# Patient Record
Sex: Male | Born: 1960 | Race: Black or African American | Hispanic: No | Marital: Married | Smoking: Never smoker
Health system: Southern US, Community
[De-identification: ages and names within clinical notes are randomized; demographics above are authoritative.]

## PROBLEM LIST (undated history)

## (undated) DIAGNOSIS — N186 End stage renal disease: Secondary | ICD-10-CM

## (undated) DIAGNOSIS — I10 Essential (primary) hypertension: Secondary | ICD-10-CM

## (undated) DIAGNOSIS — N289 Disorder of kidney and ureter, unspecified: Secondary | ICD-10-CM

## (undated) DIAGNOSIS — E119 Type 2 diabetes mellitus without complications: Secondary | ICD-10-CM

## (undated) DIAGNOSIS — I739 Peripheral vascular disease, unspecified: Secondary | ICD-10-CM

## (undated) DIAGNOSIS — N189 Chronic kidney disease, unspecified: Secondary | ICD-10-CM

## (undated) HISTORY — PX: OTHER SURGICAL HISTORY: SHX169

## (undated) HISTORY — DX: Chronic kidney disease, unspecified: N18.9

## (undated) HISTORY — DX: Type 2 diabetes mellitus without complications: E11.9

---

## 2012-05-19 ENCOUNTER — Emergency Department (HOSPITAL_COMMUNITY): Payer: Medicaid - Out of State

## 2012-05-19 ENCOUNTER — Encounter (HOSPITAL_COMMUNITY): Payer: Self-pay

## 2012-05-19 ENCOUNTER — Emergency Department (HOSPITAL_COMMUNITY)
Admission: EM | Admit: 2012-05-19 | Discharge: 2012-05-19 | Disposition: A | Discharge status: Home / Self Care | Payer: Medicaid - Out of State | Attending: Emergency Medicine | Admitting: Emergency Medicine

## 2012-05-19 DIAGNOSIS — R6883 Chills (without fever): Secondary | ICD-10-CM | POA: Insufficient documentation

## 2012-05-19 DIAGNOSIS — I120 Hypertensive chronic kidney disease with stage 5 chronic kidney disease or end stage renal disease: Secondary | ICD-10-CM | POA: Insufficient documentation

## 2012-05-19 DIAGNOSIS — R1110 Vomiting, unspecified: Secondary | ICD-10-CM

## 2012-05-19 DIAGNOSIS — E119 Type 2 diabetes mellitus without complications: Secondary | ICD-10-CM | POA: Insufficient documentation

## 2012-05-19 DIAGNOSIS — Z992 Dependence on renal dialysis: Secondary | ICD-10-CM | POA: Insufficient documentation

## 2012-05-19 DIAGNOSIS — M549 Dorsalgia, unspecified: Secondary | ICD-10-CM

## 2012-05-19 DIAGNOSIS — Z79899 Other long term (current) drug therapy: Secondary | ICD-10-CM | POA: Insufficient documentation

## 2012-05-19 DIAGNOSIS — N186 End stage renal disease: Secondary | ICD-10-CM | POA: Insufficient documentation

## 2012-05-19 DIAGNOSIS — R112 Nausea with vomiting, unspecified: Secondary | ICD-10-CM | POA: Insufficient documentation

## 2012-05-19 HISTORY — DX: Essential (primary) hypertension: I10

## 2012-05-19 HISTORY — DX: Type 2 diabetes mellitus without complications: E11.9

## 2012-05-19 HISTORY — DX: Disorder of kidney and ureter, unspecified: N28.9

## 2012-05-19 LAB — CBC WITH DIFFERENTIAL/PLATELET
Basophils Absolute: 0 10*3/uL (ref 0.0–0.1)
Basophils Relative: 0 % (ref 0–1)
Eosinophils Absolute: 0.1 10*3/uL (ref 0.0–0.7)
HCT: 36.3 % — ABNORMAL LOW (ref 39.0–52.0)
Hemoglobin: 11.5 g/dL — ABNORMAL LOW (ref 13.0–17.0)
MCH: 27.1 pg (ref 26.0–34.0)
MCHC: 31.7 g/dL (ref 30.0–36.0)
Monocytes Absolute: 0.2 10*3/uL (ref 0.1–1.0)
Monocytes Relative: 3 % (ref 3–12)
Neutro Abs: 4.5 10*3/uL (ref 1.7–7.7)
Neutrophils Relative %: 75 % (ref 43–77)
RDW: 15.6 % — ABNORMAL HIGH (ref 11.5–15.5)

## 2012-05-19 LAB — COMPREHENSIVE METABOLIC PANEL
AST: 18 U/L (ref 0–37)
Albumin: 4.1 g/dL (ref 3.5–5.2)
BUN: 72 mg/dL — ABNORMAL HIGH (ref 6–23)
Chloride: 100 mEq/L (ref 96–112)
Creatinine, Ser: 10.35 mg/dL — ABNORMAL HIGH (ref 0.50–1.35)
Total Protein: 8.8 g/dL — ABNORMAL HIGH (ref 6.0–8.3)

## 2012-05-19 MED ORDER — ONDANSETRON 4 MG PO TBDP: 4.0000 mg | ORAL_TABLET | Freq: Three times a day (TID) | ORAL | Status: DC | PRN

## 2012-05-19 MED ORDER — HYDROCODONE-ACETAMINOPHEN 5-325 MG PO TABS: 1.0000 | ORAL_TABLET | Freq: Three times a day (TID) | ORAL | Status: AC | PRN

## 2012-05-19 MED ORDER — SODIUM CHLORIDE 0.9 % IV SOLN
INTRAVENOUS | Status: DC
  Administered 2012-05-19: 09:00:00 via INTRAVENOUS

## 2012-05-19 MED ORDER — ONDANSETRON HCL 4 MG/2ML IJ SOLN
4.0000 mg | Freq: Once | INTRAMUSCULAR | Status: AC
  Administered 2012-05-19: 4 mg via INTRAVENOUS
  Filled 2012-05-19: qty 2

## 2012-05-19 MED ORDER — HYDROMORPHONE HCL PF 1 MG/ML IJ SOLN
1.0000 mg | Freq: Once | INTRAMUSCULAR | Status: AC
  Administered 2012-05-19: 1 mg via INTRAVENOUS
  Filled 2012-05-19: qty 1

## 2012-05-19 MED ORDER — SODIUM CHLORIDE 0.9 % IV SOLN: INTRAVENOUS | Status: DC

## 2012-05-19 NOTE — ED Provider Notes (Signed)
History  This chart was scribed for Raymond Kung, MD by Jenne Campus, ED Scribe. This patient was seen in room APA18/[facility]18 and the patient's care was started at 8:05 PM.  CSN: [id number]  Arrival date & time 05/19/12  0736   First MD Initiated Contact with Patient 05/19/12 0805      Chief Complaint  Patient presents with  . Flank Pain     Patient is a 51 y.o. male presenting with flank pain. The history is provided by the patient. No language interpreter was used.  Flank Pain This is a new problem. The current episode started 3 to 5 hours ago. The problem occurs constantly. The problem has been gradually worsening. Associated symptoms include abdominal pain (mild). Pertinent negatives include no chest pain, no headaches and no shortness of breath. Nothing aggravates the symptoms. Nothing relieves the symptoms. He has tried nothing for the symptoms.    Raymond [name] is a 51 y.o. male who presents to the Emergency Department complaining of 4 hours of gradual onset, gradually worsening, constant of left flank pain described as a sharp ache with associated mild epigastric abdominal pain, chills, nausea and 10 episodes of non-bloody emesis. He rates his pain an 8 out of 10 currently. He denies having prior episodes of similar symptoms. He has a h/o kidney failure and is a dialysis pt (Tues, Thurs, Sat) started 2 years ago. He reports that he had his normal dialysis appointment 2 days ago and his next appointment is in 2 days. He states that he has chronic decreased frequency due to dialysis but denies changes. He denies having any known sick contacts with similar symptoms. He denies diarrhea, HA, and sore throat as associated symptoms. He has a h/o HTN (BP is 209/121 in the ED) and DM and denies alcohol use.  Endocrinologist is Dr. Alesia Banda   Past Medical History  Diagnosis Date  . Hypertension   . Renal disorder   . Diabetes mellitus without complication     History reviewed. No  pertinent past surgical history.  No family history on file.  History  Substance Use Topics  . Smoking status: Not on file  . Smokeless tobacco: Not on file  . Alcohol Use: No      Review of Systems  Constitutional: Positive for chills. Negative for fever.  Respiratory: Negative for cough and shortness of breath.   Cardiovascular: Negative for chest pain.  Gastrointestinal: Positive for nausea, vomiting and abdominal pain (mild). Negative for diarrhea.  Genitourinary: Positive for flank pain.  Neurological: Negative for headaches.  All other systems reviewed and are negative.    Allergies  Review of patient's allergies indicates no known allergies.  Home Medications     Triage Vitals: BP 209/121  Pulse 78  Temp 97.9 F (36.6 C) (Oral)  Resp 16  Ht 5\' 7"  (1.702 m)  Wt 216 lb 0.8 oz (98 kg)  BMI 33.84 kg/m2  SpO2 98%  Physical Exam  Nursing note and vitals reviewed. Constitutional: He is oriented to person, place, and time. He appears well-developed and well-nourished. No distress.  HENT:  Head: Normocephalic and atraumatic.  Mouth/Throat: Oropharynx is clear and moist.  Eyes: Conjunctivae normal and EOM are normal. Pupils are equal, round, and reactive to light.  Neck: Normal range of motion. Neck supple. No tracheal deviation present.  Cardiovascular: Normal rate and regular rhythm.   No murmur heard. Pulmonary/Chest: Effort normal and breath sounds normal. No respiratory distress.  Abdominal: Soft. Bowel sounds are normal.  There is no tenderness.       No CVA tenderness to palpation  Musculoskeletal: Normal range of motion.       No back tenderness to palpation  Neurological: He is alert and oriented to person, place, and time. No cranial nerve deficit.       Pt able to move both sets of fingers and toes  Skin: Skin is warm and dry.  Psychiatric: He has a normal mood and affect. His behavior is normal.    ED Course  Procedures (including critical care  time)  DIAGNOSTIC STUDIES: Oxygen Saturation is 98% on room air, normal by my interpretation.    COORDINATION OF CARE: 8:24 AM- Discussed treatment plan which includes CT abdomen, CXR and pain and antiemetic medications with pt at bedside and pt agreed to plan.  8:45 AM- Ordered 4 mg Zofran injection and 1 mg Dilaudid injection.  12:23 PM-Pt rechecked and feels improved. Upon re-exam, back is still non-tender to palpation. He is requesting food. Informed pt of radiology and lab results. Discussed discharge plan with pt and pt agreed to plan. Also advised pt to follow up with PCP and pt agreed.  Labs Reviewed  CBC WITH DIFFERENTIAL - Abnormal; Notable for the following:    Hemoglobin 11.5 (*)     HCT 36.3 (*)     RDW 15.6 (*)     All other components within normal limits  COMPREHENSIVE METABOLIC PANEL - Abnormal; Notable for the following:    Glucose, Bld 163 (*)     BUN 72 (*)     Creatinine, Ser 10.35 (*)     Total Protein 8.8 (*)     GFR calc non Af Amer 5 (*)     GFR calc Af Amer 6 (*)     All other components within normal limits   Ct Abdomen Pelvis Wo Contrast  05/19/2012  *RADIOLOGY REPORT*  Clinical Data: Left flank pain, vomiting  CT ABDOMEN AND PELVIS WITHOUT CONTRAST  Technique:  Multidetector CT imaging of the abdomen and pelvis was performed following the standard protocol without intravenous contrast.  Comparison: None.  Findings: The lung bases are clear.  There is mild cardiomegaly present.  The liver is unremarkable in the unenhanced state.  No calcified gallstones are seen although there is higher attenuation debris layering in the fundus of the gallbladder and small partially calcified gallstones cannot be excluded.  The pancreas has been fatty replaced.  The adrenal glands and spleen are unremarkable.  The stomach is fluid distended with no abnormality noted.  No renal calculi are seen  However, there is fullness of the left pelvocaliceal system and left ureter to the  bladder.  No definite calculus or point of obstruction is seen.  This fullness may be due to edema from recent passage of a calculus.  No abnormality of the urinary bladder is seen although the urinary bladder is not well distended.  A reservoir is noted in the right anterior pelvis for penile prosthesis.  The prostate is normal in size.  No abnormality of the colon is noted.  The terminal ileum and appendix are unremarkable. A small umbilical hernia is present containing only fat.  No bony abnormality is seen to  IMPRESSION:  1.  No renal calculi are seen.  However, there is slight fullness of the left pelvis and left ureter to the bladder with no point of obstruction.  This may be due to recently passed calculus and resultant edema. 2.  Cannot exclude  small gallstones layering in the fundus of the gallbladder. 3.  The appendix and terminal ileum are unremarkable. 4.  Small umbilical hernia containing only fat.   Original Report Authenticated By: Ivar Drape, M.D.    Dg Chest 1 View  05/19/2012  *RADIOLOGY REPORT*  Clinical Data: Flank pain  CHEST - 1 VIEW  Comparison: None.  Findings: Borderline cardiomegaly noted.  No acute infiltrate or pleural effusion.  No pulmonary edema.  Mild basilar atelectasis.  IMPRESSION: Borderline cardiomegaly.  No acute infiltrate.  Mild basilar atelectasis.   Original Report Authenticated By: Lahoma Crocker, M.D.      1. Vomiting   2. Back pain       MDM    Patient with acute onset of left back left flank pain this morning associated with nausea and vomiting several episodes. No diarrhea no blood in the vomit. Workup without any specific findings patient is a dialysis patient was dialyzed on Monday not due to be dialyzed again until Friday due to the holiday schedule. Patient without any leukocytosis. Clinically suspect this may be a viral illness CT without any definitive findings. Some question of maybe something around the left pelvis left ureter that could be followed  up. But does not appear to require admission at this point in time.  Patient improved in ED with pain medicine and antinausea medicine.      I personally performed the services described in this documentation, which was scribed in my presence. The recorded information has been reviewed and is accurate.     Raymond Kung, MD 05/19/12 [phone]

## 2012-05-19 NOTE — ED Notes (Signed)
Complain of pain in left flank area and n/v

## 2012-05-19 NOTE — ED Notes (Signed)
Pt cont to have hacking non productive cough

## 2013-08-25 DIAGNOSIS — D631 Anemia in chronic kidney disease: Secondary | ICD-10-CM | POA: Diagnosis not present

## 2013-08-25 DIAGNOSIS — D509 Iron deficiency anemia, unspecified: Secondary | ICD-10-CM | POA: Diagnosis not present

## 2013-08-25 DIAGNOSIS — R1110 Vomiting, unspecified: Secondary | ICD-10-CM | POA: Diagnosis not present

## 2013-08-25 DIAGNOSIS — N039 Chronic nephritic syndrome with unspecified morphologic changes: Secondary | ICD-10-CM | POA: Diagnosis not present

## 2013-08-25 DIAGNOSIS — N186 End stage renal disease: Secondary | ICD-10-CM | POA: Diagnosis not present

## 2013-08-25 DIAGNOSIS — R110 Nausea: Secondary | ICD-10-CM | POA: Diagnosis not present

## 2013-08-25 DIAGNOSIS — N2581 Secondary hyperparathyroidism of renal origin: Secondary | ICD-10-CM | POA: Diagnosis not present

## 2013-08-27 DIAGNOSIS — N2581 Secondary hyperparathyroidism of renal origin: Secondary | ICD-10-CM | POA: Diagnosis not present

## 2013-08-27 DIAGNOSIS — D509 Iron deficiency anemia, unspecified: Secondary | ICD-10-CM | POA: Diagnosis not present

## 2013-08-27 DIAGNOSIS — N039 Chronic nephritic syndrome with unspecified morphologic changes: Secondary | ICD-10-CM | POA: Diagnosis not present

## 2013-08-27 DIAGNOSIS — D631 Anemia in chronic kidney disease: Secondary | ICD-10-CM | POA: Diagnosis not present

## 2013-08-27 DIAGNOSIS — R1110 Vomiting, unspecified: Secondary | ICD-10-CM | POA: Diagnosis not present

## 2013-08-27 DIAGNOSIS — R110 Nausea: Secondary | ICD-10-CM | POA: Diagnosis not present

## 2013-08-27 DIAGNOSIS — N186 End stage renal disease: Secondary | ICD-10-CM | POA: Diagnosis not present

## 2013-08-30 DIAGNOSIS — R1110 Vomiting, unspecified: Secondary | ICD-10-CM | POA: Diagnosis not present

## 2013-08-30 DIAGNOSIS — N186 End stage renal disease: Secondary | ICD-10-CM | POA: Diagnosis not present

## 2013-08-30 DIAGNOSIS — D509 Iron deficiency anemia, unspecified: Secondary | ICD-10-CM | POA: Diagnosis not present

## 2013-08-30 DIAGNOSIS — R110 Nausea: Secondary | ICD-10-CM | POA: Diagnosis not present

## 2013-08-30 DIAGNOSIS — N039 Chronic nephritic syndrome with unspecified morphologic changes: Secondary | ICD-10-CM | POA: Diagnosis not present

## 2013-08-30 DIAGNOSIS — D631 Anemia in chronic kidney disease: Secondary | ICD-10-CM | POA: Diagnosis not present

## 2013-08-30 DIAGNOSIS — N2581 Secondary hyperparathyroidism of renal origin: Secondary | ICD-10-CM | POA: Diagnosis not present

## 2013-09-01 DIAGNOSIS — R110 Nausea: Secondary | ICD-10-CM | POA: Diagnosis not present

## 2013-09-01 DIAGNOSIS — N039 Chronic nephritic syndrome with unspecified morphologic changes: Secondary | ICD-10-CM | POA: Diagnosis not present

## 2013-09-01 DIAGNOSIS — E119 Type 2 diabetes mellitus without complications: Secondary | ICD-10-CM | POA: Diagnosis not present

## 2013-09-01 DIAGNOSIS — N186 End stage renal disease: Secondary | ICD-10-CM | POA: Diagnosis not present

## 2013-09-01 DIAGNOSIS — L97509 Non-pressure chronic ulcer of other part of unspecified foot with unspecified severity: Secondary | ICD-10-CM | POA: Diagnosis not present

## 2013-09-01 DIAGNOSIS — R1110 Vomiting, unspecified: Secondary | ICD-10-CM | POA: Diagnosis not present

## 2013-09-01 DIAGNOSIS — M79609 Pain in unspecified limb: Secondary | ICD-10-CM | POA: Diagnosis not present

## 2013-09-01 DIAGNOSIS — N2581 Secondary hyperparathyroidism of renal origin: Secondary | ICD-10-CM | POA: Diagnosis not present

## 2013-09-01 DIAGNOSIS — D631 Anemia in chronic kidney disease: Secondary | ICD-10-CM | POA: Diagnosis not present

## 2013-09-01 DIAGNOSIS — D509 Iron deficiency anemia, unspecified: Secondary | ICD-10-CM | POA: Diagnosis not present

## 2013-09-03 DIAGNOSIS — N186 End stage renal disease: Secondary | ICD-10-CM | POA: Diagnosis not present

## 2013-09-03 DIAGNOSIS — D509 Iron deficiency anemia, unspecified: Secondary | ICD-10-CM | POA: Diagnosis not present

## 2013-09-03 DIAGNOSIS — R110 Nausea: Secondary | ICD-10-CM | POA: Diagnosis not present

## 2013-09-03 DIAGNOSIS — R1110 Vomiting, unspecified: Secondary | ICD-10-CM | POA: Diagnosis not present

## 2013-09-03 DIAGNOSIS — N2581 Secondary hyperparathyroidism of renal origin: Secondary | ICD-10-CM | POA: Diagnosis not present

## 2013-09-03 DIAGNOSIS — D631 Anemia in chronic kidney disease: Secondary | ICD-10-CM | POA: Diagnosis not present

## 2013-09-05 DIAGNOSIS — N186 End stage renal disease: Secondary | ICD-10-CM | POA: Diagnosis not present

## 2013-09-05 DIAGNOSIS — I10 Essential (primary) hypertension: Secondary | ICD-10-CM | POA: Diagnosis not present

## 2013-09-05 DIAGNOSIS — E669 Obesity, unspecified: Secondary | ICD-10-CM | POA: Diagnosis not present

## 2013-09-05 DIAGNOSIS — E785 Hyperlipidemia, unspecified: Secondary | ICD-10-CM | POA: Diagnosis not present

## 2013-09-06 DIAGNOSIS — D509 Iron deficiency anemia, unspecified: Secondary | ICD-10-CM | POA: Diagnosis not present

## 2013-09-06 DIAGNOSIS — R110 Nausea: Secondary | ICD-10-CM | POA: Diagnosis not present

## 2013-09-06 DIAGNOSIS — N186 End stage renal disease: Secondary | ICD-10-CM | POA: Diagnosis not present

## 2013-09-06 DIAGNOSIS — D631 Anemia in chronic kidney disease: Secondary | ICD-10-CM | POA: Diagnosis not present

## 2013-09-06 DIAGNOSIS — N2581 Secondary hyperparathyroidism of renal origin: Secondary | ICD-10-CM | POA: Diagnosis not present

## 2013-09-06 DIAGNOSIS — R1110 Vomiting, unspecified: Secondary | ICD-10-CM | POA: Diagnosis not present

## 2013-09-07 ENCOUNTER — Ambulatory Visit: Payer: Self-pay | Admitting: Vascular Surgery

## 2013-09-07 DIAGNOSIS — Z01812 Encounter for preprocedural laboratory examination: Secondary | ICD-10-CM | POA: Diagnosis not present

## 2013-09-07 DIAGNOSIS — E1129 Type 2 diabetes mellitus with other diabetic kidney complication: Secondary | ICD-10-CM | POA: Diagnosis not present

## 2013-09-07 DIAGNOSIS — I120 Hypertensive chronic kidney disease with stage 5 chronic kidney disease or end stage renal disease: Secondary | ICD-10-CM | POA: Diagnosis not present

## 2013-09-07 DIAGNOSIS — N186 End stage renal disease: Secondary | ICD-10-CM | POA: Diagnosis not present

## 2013-09-07 DIAGNOSIS — Z01810 Encounter for preprocedural cardiovascular examination: Secondary | ICD-10-CM | POA: Diagnosis not present

## 2013-09-07 LAB — CBC WITH DIFFERENTIAL/PLATELET
Basophil #: 0 10*3/uL (ref 0.0–0.1)
Basophil %: 0.8 %
EOS PCT: 5.3 %
Eosinophil #: 0.2 10*3/uL (ref 0.0–0.7)
HCT: 33.9 % — ABNORMAL LOW (ref 40.0–52.0)
HGB: 10.5 g/dL — ABNORMAL LOW (ref 13.0–18.0)
Lymphocyte #: 1.9 10*3/uL (ref 1.0–3.6)
Lymphocyte %: 47.5 %
MCH: 26.9 pg (ref 26.0–34.0)
MCHC: 31.1 g/dL — ABNORMAL LOW (ref 32.0–36.0)
MCV: 87 fL (ref 80–100)
Monocyte #: 0.4 x10 3/mm (ref 0.2–1.0)
Monocyte %: 9.9 %
NEUTROS ABS: 1.5 10*3/uL (ref 1.4–6.5)
NEUTROS PCT: 36.5 %
Platelet: 147 10*3/uL — ABNORMAL LOW (ref 150–440)
RBC: 3.91 10*6/uL — AB (ref 4.40–5.90)
RDW: 14.1 % (ref 11.5–14.5)
WBC: 4 10*3/uL (ref 3.8–10.6)

## 2013-09-07 LAB — BASIC METABOLIC PANEL
ANION GAP: 7 (ref 7–16)
BUN: 49 mg/dL — AB (ref 7–18)
CHLORIDE: 99 mmol/L (ref 98–107)
Calcium, Total: 9.8 mg/dL (ref 8.5–10.1)
Co2: 31 mmol/L (ref 21–32)
Creatinine: 7.25 mg/dL — ABNORMAL HIGH (ref 0.60–1.30)
EGFR (African American): 9 — ABNORMAL LOW
EGFR (Non-African Amer.): 8 — ABNORMAL LOW
GLUCOSE: 129 mg/dL — AB (ref 65–99)
Osmolality: 288 (ref 275–301)
POTASSIUM: 3.8 mmol/L (ref 3.5–5.1)
Sodium: 137 mmol/L (ref 136–145)

## 2013-09-07 LAB — PROTIME-INR
INR: 0.9
Prothrombin Time: 12.3 secs (ref 11.5–14.7)

## 2013-09-07 LAB — APTT: Activated PTT: 40.1 secs — ABNORMAL HIGH (ref 23.6–35.9)

## 2013-09-08 DIAGNOSIS — R1110 Vomiting, unspecified: Secondary | ICD-10-CM | POA: Diagnosis not present

## 2013-09-08 DIAGNOSIS — N2581 Secondary hyperparathyroidism of renal origin: Secondary | ICD-10-CM | POA: Diagnosis not present

## 2013-09-08 DIAGNOSIS — D631 Anemia in chronic kidney disease: Secondary | ICD-10-CM | POA: Diagnosis not present

## 2013-09-08 DIAGNOSIS — R110 Nausea: Secondary | ICD-10-CM | POA: Diagnosis not present

## 2013-09-08 DIAGNOSIS — N039 Chronic nephritic syndrome with unspecified morphologic changes: Secondary | ICD-10-CM | POA: Diagnosis not present

## 2013-09-08 DIAGNOSIS — D509 Iron deficiency anemia, unspecified: Secondary | ICD-10-CM | POA: Diagnosis not present

## 2013-09-08 DIAGNOSIS — N186 End stage renal disease: Secondary | ICD-10-CM | POA: Diagnosis not present

## 2013-09-10 DIAGNOSIS — D509 Iron deficiency anemia, unspecified: Secondary | ICD-10-CM | POA: Diagnosis not present

## 2013-09-10 DIAGNOSIS — N2581 Secondary hyperparathyroidism of renal origin: Secondary | ICD-10-CM | POA: Diagnosis not present

## 2013-09-10 DIAGNOSIS — N186 End stage renal disease: Secondary | ICD-10-CM | POA: Diagnosis not present

## 2013-09-10 DIAGNOSIS — R110 Nausea: Secondary | ICD-10-CM | POA: Diagnosis not present

## 2013-09-10 DIAGNOSIS — D631 Anemia in chronic kidney disease: Secondary | ICD-10-CM | POA: Diagnosis not present

## 2013-09-10 DIAGNOSIS — R1110 Vomiting, unspecified: Secondary | ICD-10-CM | POA: Diagnosis not present

## 2013-09-13 DIAGNOSIS — R1110 Vomiting, unspecified: Secondary | ICD-10-CM | POA: Diagnosis not present

## 2013-09-13 DIAGNOSIS — N2581 Secondary hyperparathyroidism of renal origin: Secondary | ICD-10-CM | POA: Diagnosis not present

## 2013-09-13 DIAGNOSIS — N186 End stage renal disease: Secondary | ICD-10-CM | POA: Diagnosis not present

## 2013-09-13 DIAGNOSIS — D631 Anemia in chronic kidney disease: Secondary | ICD-10-CM | POA: Diagnosis not present

## 2013-09-13 DIAGNOSIS — D509 Iron deficiency anemia, unspecified: Secondary | ICD-10-CM | POA: Diagnosis not present

## 2013-09-13 DIAGNOSIS — R110 Nausea: Secondary | ICD-10-CM | POA: Diagnosis not present

## 2013-09-15 DIAGNOSIS — D631 Anemia in chronic kidney disease: Secondary | ICD-10-CM | POA: Diagnosis not present

## 2013-09-15 DIAGNOSIS — N2581 Secondary hyperparathyroidism of renal origin: Secondary | ICD-10-CM | POA: Diagnosis not present

## 2013-09-15 DIAGNOSIS — N039 Chronic nephritic syndrome with unspecified morphologic changes: Secondary | ICD-10-CM | POA: Diagnosis not present

## 2013-09-15 DIAGNOSIS — R1110 Vomiting, unspecified: Secondary | ICD-10-CM | POA: Diagnosis not present

## 2013-09-15 DIAGNOSIS — D509 Iron deficiency anemia, unspecified: Secondary | ICD-10-CM | POA: Diagnosis not present

## 2013-09-15 DIAGNOSIS — N186 End stage renal disease: Secondary | ICD-10-CM | POA: Diagnosis not present

## 2013-09-15 DIAGNOSIS — R110 Nausea: Secondary | ICD-10-CM | POA: Diagnosis not present

## 2013-09-16 ENCOUNTER — Ambulatory Visit: Payer: Self-pay | Admitting: Vascular Surgery

## 2013-09-16 DIAGNOSIS — E785 Hyperlipidemia, unspecified: Secondary | ICD-10-CM | POA: Diagnosis not present

## 2013-09-16 DIAGNOSIS — N186 End stage renal disease: Secondary | ICD-10-CM | POA: Diagnosis not present

## 2013-09-16 DIAGNOSIS — E7800 Pure hypercholesterolemia, unspecified: Secondary | ICD-10-CM | POA: Diagnosis not present

## 2013-09-16 DIAGNOSIS — N185 Chronic kidney disease, stage 5: Secondary | ICD-10-CM | POA: Diagnosis not present

## 2013-09-16 DIAGNOSIS — E669 Obesity, unspecified: Secondary | ICD-10-CM | POA: Diagnosis not present

## 2013-09-16 DIAGNOSIS — Z6833 Body mass index (BMI) 33.0-33.9, adult: Secondary | ICD-10-CM | POA: Diagnosis not present

## 2013-09-16 DIAGNOSIS — I120 Hypertensive chronic kidney disease with stage 5 chronic kidney disease or end stage renal disease: Secondary | ICD-10-CM | POA: Diagnosis not present

## 2013-09-16 DIAGNOSIS — E1129 Type 2 diabetes mellitus with other diabetic kidney complication: Secondary | ICD-10-CM | POA: Diagnosis not present

## 2013-09-16 DIAGNOSIS — Z79899 Other long term (current) drug therapy: Secondary | ICD-10-CM | POA: Diagnosis not present

## 2013-09-17 DIAGNOSIS — N2581 Secondary hyperparathyroidism of renal origin: Secondary | ICD-10-CM | POA: Diagnosis not present

## 2013-09-17 DIAGNOSIS — N186 End stage renal disease: Secondary | ICD-10-CM | POA: Diagnosis not present

## 2013-09-17 DIAGNOSIS — R110 Nausea: Secondary | ICD-10-CM | POA: Diagnosis not present

## 2013-09-17 DIAGNOSIS — D509 Iron deficiency anemia, unspecified: Secondary | ICD-10-CM | POA: Diagnosis not present

## 2013-09-17 DIAGNOSIS — R1110 Vomiting, unspecified: Secondary | ICD-10-CM | POA: Diagnosis not present

## 2013-09-17 DIAGNOSIS — D631 Anemia in chronic kidney disease: Secondary | ICD-10-CM | POA: Diagnosis not present

## 2013-09-20 DIAGNOSIS — R110 Nausea: Secondary | ICD-10-CM | POA: Diagnosis not present

## 2013-09-20 DIAGNOSIS — N2581 Secondary hyperparathyroidism of renal origin: Secondary | ICD-10-CM | POA: Diagnosis not present

## 2013-09-20 DIAGNOSIS — N186 End stage renal disease: Secondary | ICD-10-CM | POA: Diagnosis not present

## 2013-09-20 DIAGNOSIS — R1110 Vomiting, unspecified: Secondary | ICD-10-CM | POA: Diagnosis not present

## 2013-09-20 DIAGNOSIS — D631 Anemia in chronic kidney disease: Secondary | ICD-10-CM | POA: Diagnosis not present

## 2013-09-20 DIAGNOSIS — N039 Chronic nephritic syndrome with unspecified morphologic changes: Secondary | ICD-10-CM | POA: Diagnosis not present

## 2013-09-20 DIAGNOSIS — D509 Iron deficiency anemia, unspecified: Secondary | ICD-10-CM | POA: Diagnosis not present

## 2013-09-22 DIAGNOSIS — N186 End stage renal disease: Secondary | ICD-10-CM | POA: Diagnosis not present

## 2013-09-22 DIAGNOSIS — N039 Chronic nephritic syndrome with unspecified morphologic changes: Secondary | ICD-10-CM | POA: Diagnosis not present

## 2013-09-22 DIAGNOSIS — D631 Anemia in chronic kidney disease: Secondary | ICD-10-CM | POA: Diagnosis not present

## 2013-09-22 DIAGNOSIS — R110 Nausea: Secondary | ICD-10-CM | POA: Diagnosis not present

## 2013-09-22 DIAGNOSIS — D509 Iron deficiency anemia, unspecified: Secondary | ICD-10-CM | POA: Diagnosis not present

## 2013-09-22 DIAGNOSIS — R1110 Vomiting, unspecified: Secondary | ICD-10-CM | POA: Diagnosis not present

## 2013-09-22 DIAGNOSIS — N2581 Secondary hyperparathyroidism of renal origin: Secondary | ICD-10-CM | POA: Diagnosis not present

## 2013-09-24 DIAGNOSIS — D631 Anemia in chronic kidney disease: Secondary | ICD-10-CM | POA: Diagnosis not present

## 2013-09-24 DIAGNOSIS — D509 Iron deficiency anemia, unspecified: Secondary | ICD-10-CM | POA: Diagnosis not present

## 2013-09-24 DIAGNOSIS — N186 End stage renal disease: Secondary | ICD-10-CM | POA: Diagnosis not present

## 2013-09-24 DIAGNOSIS — N2581 Secondary hyperparathyroidism of renal origin: Secondary | ICD-10-CM | POA: Diagnosis not present

## 2013-09-27 DIAGNOSIS — D631 Anemia in chronic kidney disease: Secondary | ICD-10-CM | POA: Diagnosis not present

## 2013-09-27 DIAGNOSIS — N2581 Secondary hyperparathyroidism of renal origin: Secondary | ICD-10-CM | POA: Diagnosis not present

## 2013-09-27 DIAGNOSIS — D509 Iron deficiency anemia, unspecified: Secondary | ICD-10-CM | POA: Diagnosis not present

## 2013-09-27 DIAGNOSIS — N186 End stage renal disease: Secondary | ICD-10-CM | POA: Diagnosis not present

## 2013-09-29 DIAGNOSIS — D509 Iron deficiency anemia, unspecified: Secondary | ICD-10-CM | POA: Diagnosis not present

## 2013-09-29 DIAGNOSIS — N2581 Secondary hyperparathyroidism of renal origin: Secondary | ICD-10-CM | POA: Diagnosis not present

## 2013-09-29 DIAGNOSIS — N186 End stage renal disease: Secondary | ICD-10-CM | POA: Diagnosis not present

## 2013-09-29 DIAGNOSIS — D631 Anemia in chronic kidney disease: Secondary | ICD-10-CM | POA: Diagnosis not present

## 2013-10-01 DIAGNOSIS — N039 Chronic nephritic syndrome with unspecified morphologic changes: Secondary | ICD-10-CM | POA: Diagnosis not present

## 2013-10-01 DIAGNOSIS — N186 End stage renal disease: Secondary | ICD-10-CM | POA: Diagnosis not present

## 2013-10-01 DIAGNOSIS — N2581 Secondary hyperparathyroidism of renal origin: Secondary | ICD-10-CM | POA: Diagnosis not present

## 2013-10-01 DIAGNOSIS — D631 Anemia in chronic kidney disease: Secondary | ICD-10-CM | POA: Diagnosis not present

## 2013-10-01 DIAGNOSIS — D509 Iron deficiency anemia, unspecified: Secondary | ICD-10-CM | POA: Diagnosis not present

## 2013-10-04 DIAGNOSIS — D631 Anemia in chronic kidney disease: Secondary | ICD-10-CM | POA: Diagnosis not present

## 2013-10-04 DIAGNOSIS — D509 Iron deficiency anemia, unspecified: Secondary | ICD-10-CM | POA: Diagnosis not present

## 2013-10-04 DIAGNOSIS — N2581 Secondary hyperparathyroidism of renal origin: Secondary | ICD-10-CM | POA: Diagnosis not present

## 2013-10-04 DIAGNOSIS — N186 End stage renal disease: Secondary | ICD-10-CM | POA: Diagnosis not present

## 2013-10-04 DIAGNOSIS — N039 Chronic nephritic syndrome with unspecified morphologic changes: Secondary | ICD-10-CM | POA: Diagnosis not present

## 2013-10-06 DIAGNOSIS — N186 End stage renal disease: Secondary | ICD-10-CM | POA: Diagnosis not present

## 2013-10-06 DIAGNOSIS — D631 Anemia in chronic kidney disease: Secondary | ICD-10-CM | POA: Diagnosis not present

## 2013-10-06 DIAGNOSIS — N2581 Secondary hyperparathyroidism of renal origin: Secondary | ICD-10-CM | POA: Diagnosis not present

## 2013-10-06 DIAGNOSIS — D509 Iron deficiency anemia, unspecified: Secondary | ICD-10-CM | POA: Diagnosis not present

## 2013-10-08 DIAGNOSIS — D509 Iron deficiency anemia, unspecified: Secondary | ICD-10-CM | POA: Diagnosis not present

## 2013-10-08 DIAGNOSIS — N186 End stage renal disease: Secondary | ICD-10-CM | POA: Diagnosis not present

## 2013-10-08 DIAGNOSIS — N2581 Secondary hyperparathyroidism of renal origin: Secondary | ICD-10-CM | POA: Diagnosis not present

## 2013-10-08 DIAGNOSIS — D631 Anemia in chronic kidney disease: Secondary | ICD-10-CM | POA: Diagnosis not present

## 2013-10-11 DIAGNOSIS — N186 End stage renal disease: Secondary | ICD-10-CM | POA: Diagnosis not present

## 2013-10-11 DIAGNOSIS — N039 Chronic nephritic syndrome with unspecified morphologic changes: Secondary | ICD-10-CM | POA: Diagnosis not present

## 2013-10-11 DIAGNOSIS — D509 Iron deficiency anemia, unspecified: Secondary | ICD-10-CM | POA: Diagnosis not present

## 2013-10-11 DIAGNOSIS — N2581 Secondary hyperparathyroidism of renal origin: Secondary | ICD-10-CM | POA: Diagnosis not present

## 2013-10-11 DIAGNOSIS — D631 Anemia in chronic kidney disease: Secondary | ICD-10-CM | POA: Diagnosis not present

## 2013-10-13 DIAGNOSIS — D509 Iron deficiency anemia, unspecified: Secondary | ICD-10-CM | POA: Diagnosis not present

## 2013-10-13 DIAGNOSIS — N186 End stage renal disease: Secondary | ICD-10-CM | POA: Diagnosis not present

## 2013-10-13 DIAGNOSIS — D631 Anemia in chronic kidney disease: Secondary | ICD-10-CM | POA: Diagnosis not present

## 2013-10-13 DIAGNOSIS — N2581 Secondary hyperparathyroidism of renal origin: Secondary | ICD-10-CM | POA: Diagnosis not present

## 2013-10-15 DIAGNOSIS — D631 Anemia in chronic kidney disease: Secondary | ICD-10-CM | POA: Diagnosis not present

## 2013-10-15 DIAGNOSIS — N2581 Secondary hyperparathyroidism of renal origin: Secondary | ICD-10-CM | POA: Diagnosis not present

## 2013-10-15 DIAGNOSIS — D509 Iron deficiency anemia, unspecified: Secondary | ICD-10-CM | POA: Diagnosis not present

## 2013-10-15 DIAGNOSIS — N186 End stage renal disease: Secondary | ICD-10-CM | POA: Diagnosis not present

## 2013-10-17 DIAGNOSIS — N186 End stage renal disease: Secondary | ICD-10-CM | POA: Diagnosis not present

## 2013-10-18 DIAGNOSIS — N186 End stage renal disease: Secondary | ICD-10-CM | POA: Diagnosis not present

## 2013-10-19 DIAGNOSIS — N186 End stage renal disease: Secondary | ICD-10-CM | POA: Diagnosis not present

## 2013-10-20 DIAGNOSIS — N186 End stage renal disease: Secondary | ICD-10-CM | POA: Diagnosis not present

## 2013-10-23 DIAGNOSIS — N186 End stage renal disease: Secondary | ICD-10-CM | POA: Diagnosis not present

## 2013-10-25 DIAGNOSIS — N186 End stage renal disease: Secondary | ICD-10-CM | POA: Diagnosis not present

## 2013-10-25 DIAGNOSIS — D631 Anemia in chronic kidney disease: Secondary | ICD-10-CM | POA: Diagnosis not present

## 2013-10-25 DIAGNOSIS — N2581 Secondary hyperparathyroidism of renal origin: Secondary | ICD-10-CM | POA: Diagnosis not present

## 2013-10-25 DIAGNOSIS — Z992 Dependence on renal dialysis: Secondary | ICD-10-CM | POA: Diagnosis not present

## 2013-10-25 DIAGNOSIS — D509 Iron deficiency anemia, unspecified: Secondary | ICD-10-CM | POA: Diagnosis not present

## 2013-10-26 DIAGNOSIS — N2581 Secondary hyperparathyroidism of renal origin: Secondary | ICD-10-CM | POA: Diagnosis not present

## 2013-10-26 DIAGNOSIS — D631 Anemia in chronic kidney disease: Secondary | ICD-10-CM | POA: Diagnosis not present

## 2013-10-26 DIAGNOSIS — Z992 Dependence on renal dialysis: Secondary | ICD-10-CM | POA: Diagnosis not present

## 2013-10-26 DIAGNOSIS — N186 End stage renal disease: Secondary | ICD-10-CM | POA: Diagnosis not present

## 2013-10-26 DIAGNOSIS — D509 Iron deficiency anemia, unspecified: Secondary | ICD-10-CM | POA: Diagnosis not present

## 2013-10-27 DIAGNOSIS — Z992 Dependence on renal dialysis: Secondary | ICD-10-CM | POA: Diagnosis not present

## 2013-10-27 DIAGNOSIS — N186 End stage renal disease: Secondary | ICD-10-CM | POA: Diagnosis not present

## 2013-10-27 DIAGNOSIS — D509 Iron deficiency anemia, unspecified: Secondary | ICD-10-CM | POA: Diagnosis not present

## 2013-10-27 DIAGNOSIS — N2581 Secondary hyperparathyroidism of renal origin: Secondary | ICD-10-CM | POA: Diagnosis not present

## 2013-10-27 DIAGNOSIS — D631 Anemia in chronic kidney disease: Secondary | ICD-10-CM | POA: Diagnosis not present

## 2013-10-28 DIAGNOSIS — D509 Iron deficiency anemia, unspecified: Secondary | ICD-10-CM | POA: Diagnosis not present

## 2013-10-28 DIAGNOSIS — N186 End stage renal disease: Secondary | ICD-10-CM | POA: Diagnosis not present

## 2013-10-28 DIAGNOSIS — Z992 Dependence on renal dialysis: Secondary | ICD-10-CM | POA: Diagnosis not present

## 2013-10-28 DIAGNOSIS — D631 Anemia in chronic kidney disease: Secondary | ICD-10-CM | POA: Diagnosis not present

## 2013-10-28 DIAGNOSIS — N2581 Secondary hyperparathyroidism of renal origin: Secondary | ICD-10-CM | POA: Diagnosis not present

## 2013-10-31 DIAGNOSIS — N039 Chronic nephritic syndrome with unspecified morphologic changes: Secondary | ICD-10-CM | POA: Diagnosis not present

## 2013-10-31 DIAGNOSIS — N186 End stage renal disease: Secondary | ICD-10-CM | POA: Diagnosis not present

## 2013-10-31 DIAGNOSIS — D631 Anemia in chronic kidney disease: Secondary | ICD-10-CM | POA: Diagnosis not present

## 2013-11-01 DIAGNOSIS — D631 Anemia in chronic kidney disease: Secondary | ICD-10-CM | POA: Diagnosis not present

## 2013-11-01 DIAGNOSIS — N039 Chronic nephritic syndrome with unspecified morphologic changes: Secondary | ICD-10-CM | POA: Diagnosis not present

## 2013-11-01 DIAGNOSIS — N186 End stage renal disease: Secondary | ICD-10-CM | POA: Diagnosis not present

## 2013-11-02 DIAGNOSIS — N039 Chronic nephritic syndrome with unspecified morphologic changes: Secondary | ICD-10-CM | POA: Diagnosis not present

## 2013-11-02 DIAGNOSIS — N186 End stage renal disease: Secondary | ICD-10-CM | POA: Diagnosis not present

## 2013-11-02 DIAGNOSIS — D631 Anemia in chronic kidney disease: Secondary | ICD-10-CM | POA: Diagnosis not present

## 2013-11-03 DIAGNOSIS — D631 Anemia in chronic kidney disease: Secondary | ICD-10-CM | POA: Diagnosis not present

## 2013-11-03 DIAGNOSIS — N186 End stage renal disease: Secondary | ICD-10-CM | POA: Diagnosis not present

## 2013-11-04 DIAGNOSIS — N186 End stage renal disease: Secondary | ICD-10-CM | POA: Diagnosis not present

## 2013-11-04 DIAGNOSIS — D631 Anemia in chronic kidney disease: Secondary | ICD-10-CM | POA: Diagnosis not present

## 2013-11-05 DIAGNOSIS — N186 End stage renal disease: Secondary | ICD-10-CM | POA: Diagnosis not present

## 2013-11-05 DIAGNOSIS — D631 Anemia in chronic kidney disease: Secondary | ICD-10-CM | POA: Diagnosis not present

## 2013-11-05 DIAGNOSIS — N039 Chronic nephritic syndrome with unspecified morphologic changes: Secondary | ICD-10-CM | POA: Diagnosis not present

## 2013-11-06 DIAGNOSIS — D631 Anemia in chronic kidney disease: Secondary | ICD-10-CM | POA: Diagnosis not present

## 2013-11-06 DIAGNOSIS — N186 End stage renal disease: Secondary | ICD-10-CM | POA: Diagnosis not present

## 2013-11-07 DIAGNOSIS — N039 Chronic nephritic syndrome with unspecified morphologic changes: Secondary | ICD-10-CM | POA: Diagnosis not present

## 2013-11-07 DIAGNOSIS — N186 End stage renal disease: Secondary | ICD-10-CM | POA: Diagnosis not present

## 2013-11-07 DIAGNOSIS — D631 Anemia in chronic kidney disease: Secondary | ICD-10-CM | POA: Diagnosis not present

## 2013-11-08 DIAGNOSIS — N186 End stage renal disease: Secondary | ICD-10-CM | POA: Diagnosis not present

## 2013-11-08 DIAGNOSIS — D631 Anemia in chronic kidney disease: Secondary | ICD-10-CM | POA: Diagnosis not present

## 2013-11-08 DIAGNOSIS — N039 Chronic nephritic syndrome with unspecified morphologic changes: Secondary | ICD-10-CM | POA: Diagnosis not present

## 2013-11-09 DIAGNOSIS — D631 Anemia in chronic kidney disease: Secondary | ICD-10-CM | POA: Diagnosis not present

## 2013-11-09 DIAGNOSIS — N186 End stage renal disease: Secondary | ICD-10-CM | POA: Diagnosis not present

## 2013-11-10 DIAGNOSIS — N186 End stage renal disease: Secondary | ICD-10-CM | POA: Diagnosis not present

## 2013-11-10 DIAGNOSIS — D631 Anemia in chronic kidney disease: Secondary | ICD-10-CM | POA: Diagnosis not present

## 2013-11-11 DIAGNOSIS — D631 Anemia in chronic kidney disease: Secondary | ICD-10-CM | POA: Diagnosis not present

## 2013-11-11 DIAGNOSIS — N186 End stage renal disease: Secondary | ICD-10-CM | POA: Diagnosis not present

## 2013-11-11 DIAGNOSIS — N039 Chronic nephritic syndrome with unspecified morphologic changes: Secondary | ICD-10-CM | POA: Diagnosis not present

## 2013-11-12 DIAGNOSIS — D631 Anemia in chronic kidney disease: Secondary | ICD-10-CM | POA: Diagnosis not present

## 2013-11-12 DIAGNOSIS — N186 End stage renal disease: Secondary | ICD-10-CM | POA: Diagnosis not present

## 2013-11-13 DIAGNOSIS — D631 Anemia in chronic kidney disease: Secondary | ICD-10-CM | POA: Diagnosis not present

## 2013-11-13 DIAGNOSIS — N186 End stage renal disease: Secondary | ICD-10-CM | POA: Diagnosis not present

## 2013-11-14 DIAGNOSIS — D631 Anemia in chronic kidney disease: Secondary | ICD-10-CM | POA: Diagnosis not present

## 2013-11-14 DIAGNOSIS — N186 End stage renal disease: Secondary | ICD-10-CM | POA: Diagnosis not present

## 2013-11-15 DIAGNOSIS — N039 Chronic nephritic syndrome with unspecified morphologic changes: Secondary | ICD-10-CM | POA: Diagnosis not present

## 2013-11-15 DIAGNOSIS — N186 End stage renal disease: Secondary | ICD-10-CM | POA: Diagnosis not present

## 2013-11-15 DIAGNOSIS — D631 Anemia in chronic kidney disease: Secondary | ICD-10-CM | POA: Diagnosis not present

## 2013-11-16 DIAGNOSIS — D631 Anemia in chronic kidney disease: Secondary | ICD-10-CM | POA: Diagnosis not present

## 2013-11-16 DIAGNOSIS — N186 End stage renal disease: Secondary | ICD-10-CM | POA: Diagnosis not present

## 2013-11-17 DIAGNOSIS — N186 End stage renal disease: Secondary | ICD-10-CM | POA: Diagnosis not present

## 2013-11-17 DIAGNOSIS — N039 Chronic nephritic syndrome with unspecified morphologic changes: Secondary | ICD-10-CM | POA: Diagnosis not present

## 2013-11-17 DIAGNOSIS — D631 Anemia in chronic kidney disease: Secondary | ICD-10-CM | POA: Diagnosis not present

## 2013-11-18 DIAGNOSIS — N186 End stage renal disease: Secondary | ICD-10-CM | POA: Diagnosis not present

## 2013-11-18 DIAGNOSIS — D631 Anemia in chronic kidney disease: Secondary | ICD-10-CM | POA: Diagnosis not present

## 2013-11-19 DIAGNOSIS — D631 Anemia in chronic kidney disease: Secondary | ICD-10-CM | POA: Diagnosis not present

## 2013-11-19 DIAGNOSIS — N186 End stage renal disease: Secondary | ICD-10-CM | POA: Diagnosis not present

## 2013-11-19 DIAGNOSIS — N039 Chronic nephritic syndrome with unspecified morphologic changes: Secondary | ICD-10-CM | POA: Diagnosis not present

## 2013-11-20 DIAGNOSIS — N186 End stage renal disease: Secondary | ICD-10-CM | POA: Diagnosis not present

## 2013-11-20 DIAGNOSIS — N039 Chronic nephritic syndrome with unspecified morphologic changes: Secondary | ICD-10-CM | POA: Diagnosis not present

## 2013-11-20 DIAGNOSIS — D631 Anemia in chronic kidney disease: Secondary | ICD-10-CM | POA: Diagnosis not present

## 2013-11-21 DIAGNOSIS — D631 Anemia in chronic kidney disease: Secondary | ICD-10-CM | POA: Diagnosis not present

## 2013-11-21 DIAGNOSIS — N186 End stage renal disease: Secondary | ICD-10-CM | POA: Diagnosis not present

## 2013-11-22 DIAGNOSIS — I10 Essential (primary) hypertension: Secondary | ICD-10-CM | POA: Diagnosis not present

## 2013-11-22 DIAGNOSIS — N186 End stage renal disease: Secondary | ICD-10-CM | POA: Diagnosis not present

## 2013-11-22 DIAGNOSIS — E669 Obesity, unspecified: Secondary | ICD-10-CM | POA: Diagnosis not present

## 2013-11-22 DIAGNOSIS — D631 Anemia in chronic kidney disease: Secondary | ICD-10-CM | POA: Diagnosis not present

## 2013-11-22 DIAGNOSIS — E119 Type 2 diabetes mellitus without complications: Secondary | ICD-10-CM | POA: Diagnosis not present

## 2013-11-23 DIAGNOSIS — N186 End stage renal disease: Secondary | ICD-10-CM | POA: Diagnosis not present

## 2013-11-23 DIAGNOSIS — D509 Iron deficiency anemia, unspecified: Secondary | ICD-10-CM | POA: Diagnosis not present

## 2013-11-23 DIAGNOSIS — D631 Anemia in chronic kidney disease: Secondary | ICD-10-CM | POA: Diagnosis not present

## 2013-11-24 DIAGNOSIS — D631 Anemia in chronic kidney disease: Secondary | ICD-10-CM | POA: Diagnosis not present

## 2013-11-24 DIAGNOSIS — D509 Iron deficiency anemia, unspecified: Secondary | ICD-10-CM | POA: Diagnosis not present

## 2013-11-24 DIAGNOSIS — N039 Chronic nephritic syndrome with unspecified morphologic changes: Secondary | ICD-10-CM | POA: Diagnosis not present

## 2013-11-24 DIAGNOSIS — N186 End stage renal disease: Secondary | ICD-10-CM | POA: Diagnosis not present

## 2013-11-25 DIAGNOSIS — D631 Anemia in chronic kidney disease: Secondary | ICD-10-CM | POA: Diagnosis not present

## 2013-11-25 DIAGNOSIS — D509 Iron deficiency anemia, unspecified: Secondary | ICD-10-CM | POA: Diagnosis not present

## 2013-11-25 DIAGNOSIS — N186 End stage renal disease: Secondary | ICD-10-CM | POA: Diagnosis not present

## 2013-11-25 DIAGNOSIS — N039 Chronic nephritic syndrome with unspecified morphologic changes: Secondary | ICD-10-CM | POA: Diagnosis not present

## 2013-11-26 DIAGNOSIS — N186 End stage renal disease: Secondary | ICD-10-CM | POA: Diagnosis not present

## 2013-11-26 DIAGNOSIS — D631 Anemia in chronic kidney disease: Secondary | ICD-10-CM | POA: Diagnosis not present

## 2013-11-26 DIAGNOSIS — D509 Iron deficiency anemia, unspecified: Secondary | ICD-10-CM | POA: Diagnosis not present

## 2013-11-27 DIAGNOSIS — D631 Anemia in chronic kidney disease: Secondary | ICD-10-CM | POA: Diagnosis not present

## 2013-11-27 DIAGNOSIS — N186 End stage renal disease: Secondary | ICD-10-CM | POA: Diagnosis not present

## 2013-11-27 DIAGNOSIS — D509 Iron deficiency anemia, unspecified: Secondary | ICD-10-CM | POA: Diagnosis not present

## 2013-11-28 DIAGNOSIS — D631 Anemia in chronic kidney disease: Secondary | ICD-10-CM | POA: Diagnosis not present

## 2013-11-28 DIAGNOSIS — N186 End stage renal disease: Secondary | ICD-10-CM | POA: Diagnosis not present

## 2013-11-28 DIAGNOSIS — D509 Iron deficiency anemia, unspecified: Secondary | ICD-10-CM | POA: Diagnosis not present

## 2013-11-29 DIAGNOSIS — D631 Anemia in chronic kidney disease: Secondary | ICD-10-CM | POA: Diagnosis not present

## 2013-11-29 DIAGNOSIS — D509 Iron deficiency anemia, unspecified: Secondary | ICD-10-CM | POA: Diagnosis not present

## 2013-11-29 DIAGNOSIS — N039 Chronic nephritic syndrome with unspecified morphologic changes: Secondary | ICD-10-CM | POA: Diagnosis not present

## 2013-11-29 DIAGNOSIS — N186 End stage renal disease: Secondary | ICD-10-CM | POA: Diagnosis not present

## 2013-11-30 DIAGNOSIS — N039 Chronic nephritic syndrome with unspecified morphologic changes: Secondary | ICD-10-CM | POA: Diagnosis not present

## 2013-11-30 DIAGNOSIS — D509 Iron deficiency anemia, unspecified: Secondary | ICD-10-CM | POA: Diagnosis not present

## 2013-11-30 DIAGNOSIS — N186 End stage renal disease: Secondary | ICD-10-CM | POA: Diagnosis not present

## 2013-11-30 DIAGNOSIS — D631 Anemia in chronic kidney disease: Secondary | ICD-10-CM | POA: Diagnosis not present

## 2013-12-01 DIAGNOSIS — D631 Anemia in chronic kidney disease: Secondary | ICD-10-CM | POA: Diagnosis not present

## 2013-12-01 DIAGNOSIS — N186 End stage renal disease: Secondary | ICD-10-CM | POA: Diagnosis not present

## 2013-12-01 DIAGNOSIS — D509 Iron deficiency anemia, unspecified: Secondary | ICD-10-CM | POA: Diagnosis not present

## 2013-12-02 DIAGNOSIS — D509 Iron deficiency anemia, unspecified: Secondary | ICD-10-CM | POA: Diagnosis not present

## 2013-12-02 DIAGNOSIS — D631 Anemia in chronic kidney disease: Secondary | ICD-10-CM | POA: Diagnosis not present

## 2013-12-02 DIAGNOSIS — N186 End stage renal disease: Secondary | ICD-10-CM | POA: Diagnosis not present

## 2013-12-03 DIAGNOSIS — N186 End stage renal disease: Secondary | ICD-10-CM | POA: Diagnosis not present

## 2013-12-03 DIAGNOSIS — D631 Anemia in chronic kidney disease: Secondary | ICD-10-CM | POA: Diagnosis not present

## 2013-12-03 DIAGNOSIS — D509 Iron deficiency anemia, unspecified: Secondary | ICD-10-CM | POA: Diagnosis not present

## 2013-12-04 DIAGNOSIS — Z8489 Family history of other specified conditions: Secondary | ICD-10-CM | POA: Diagnosis not present

## 2013-12-04 DIAGNOSIS — R0989 Other specified symptoms and signs involving the circulatory and respiratory systems: Secondary | ICD-10-CM | POA: Diagnosis not present

## 2013-12-04 DIAGNOSIS — N179 Acute kidney failure, unspecified: Secondary | ICD-10-CM | POA: Diagnosis not present

## 2013-12-04 DIAGNOSIS — Z992 Dependence on renal dialysis: Secondary | ICD-10-CM | POA: Diagnosis not present

## 2013-12-04 DIAGNOSIS — I2510 Atherosclerotic heart disease of native coronary artery without angina pectoris: Secondary | ICD-10-CM | POA: Diagnosis not present

## 2013-12-04 DIAGNOSIS — R0602 Shortness of breath: Secondary | ICD-10-CM | POA: Diagnosis not present

## 2013-12-04 DIAGNOSIS — D509 Iron deficiency anemia, unspecified: Secondary | ICD-10-CM | POA: Diagnosis not present

## 2013-12-04 DIAGNOSIS — I10 Essential (primary) hypertension: Secondary | ICD-10-CM | POA: Diagnosis not present

## 2013-12-04 DIAGNOSIS — R609 Edema, unspecified: Secondary | ICD-10-CM | POA: Diagnosis not present

## 2013-12-04 DIAGNOSIS — I509 Heart failure, unspecified: Secondary | ICD-10-CM | POA: Diagnosis not present

## 2013-12-04 DIAGNOSIS — N186 End stage renal disease: Secondary | ICD-10-CM | POA: Diagnosis not present

## 2013-12-04 DIAGNOSIS — Z800 Family history of malignant neoplasm of digestive organs: Secondary | ICD-10-CM | POA: Diagnosis not present

## 2013-12-04 DIAGNOSIS — J449 Chronic obstructive pulmonary disease, unspecified: Secondary | ICD-10-CM | POA: Diagnosis not present

## 2013-12-04 DIAGNOSIS — I120 Hypertensive chronic kidney disease with stage 5 chronic kidney disease or end stage renal disease: Secondary | ICD-10-CM | POA: Diagnosis not present

## 2013-12-04 DIAGNOSIS — N058 Unspecified nephritic syndrome with other morphologic changes: Secondary | ICD-10-CM | POA: Diagnosis not present

## 2013-12-04 DIAGNOSIS — N19 Unspecified kidney failure: Secondary | ICD-10-CM | POA: Diagnosis not present

## 2013-12-04 DIAGNOSIS — D631 Anemia in chronic kidney disease: Secondary | ICD-10-CM | POA: Diagnosis not present

## 2013-12-04 DIAGNOSIS — E1129 Type 2 diabetes mellitus with other diabetic kidney complication: Secondary | ICD-10-CM | POA: Diagnosis not present

## 2013-12-05 DIAGNOSIS — I10 Essential (primary) hypertension: Secondary | ICD-10-CM | POA: Diagnosis not present

## 2013-12-05 DIAGNOSIS — D509 Iron deficiency anemia, unspecified: Secondary | ICD-10-CM | POA: Diagnosis not present

## 2013-12-05 DIAGNOSIS — D631 Anemia in chronic kidney disease: Secondary | ICD-10-CM | POA: Diagnosis not present

## 2013-12-05 DIAGNOSIS — N186 End stage renal disease: Secondary | ICD-10-CM | POA: Diagnosis not present

## 2013-12-05 DIAGNOSIS — N289 Disorder of kidney and ureter, unspecified: Secondary | ICD-10-CM | POA: Diagnosis not present

## 2013-12-05 DIAGNOSIS — R609 Edema, unspecified: Secondary | ICD-10-CM | POA: Diagnosis not present

## 2013-12-06 DIAGNOSIS — N186 End stage renal disease: Secondary | ICD-10-CM | POA: Diagnosis not present

## 2013-12-06 DIAGNOSIS — D509 Iron deficiency anemia, unspecified: Secondary | ICD-10-CM | POA: Diagnosis not present

## 2013-12-06 DIAGNOSIS — D631 Anemia in chronic kidney disease: Secondary | ICD-10-CM | POA: Diagnosis not present

## 2013-12-07 DIAGNOSIS — N186 End stage renal disease: Secondary | ICD-10-CM | POA: Diagnosis not present

## 2013-12-07 DIAGNOSIS — D631 Anemia in chronic kidney disease: Secondary | ICD-10-CM | POA: Diagnosis not present

## 2013-12-07 DIAGNOSIS — D509 Iron deficiency anemia, unspecified: Secondary | ICD-10-CM | POA: Diagnosis not present

## 2013-12-08 DIAGNOSIS — N039 Chronic nephritic syndrome with unspecified morphologic changes: Secondary | ICD-10-CM | POA: Diagnosis not present

## 2013-12-08 DIAGNOSIS — D631 Anemia in chronic kidney disease: Secondary | ICD-10-CM | POA: Diagnosis not present

## 2013-12-08 DIAGNOSIS — D509 Iron deficiency anemia, unspecified: Secondary | ICD-10-CM | POA: Diagnosis not present

## 2013-12-08 DIAGNOSIS — N186 End stage renal disease: Secondary | ICD-10-CM | POA: Diagnosis not present

## 2013-12-09 DIAGNOSIS — N186 End stage renal disease: Secondary | ICD-10-CM | POA: Diagnosis not present

## 2013-12-09 DIAGNOSIS — Z136 Encounter for screening for cardiovascular disorders: Secondary | ICD-10-CM | POA: Diagnosis not present

## 2013-12-09 DIAGNOSIS — D631 Anemia in chronic kidney disease: Secondary | ICD-10-CM | POA: Diagnosis not present

## 2013-12-09 DIAGNOSIS — D509 Iron deficiency anemia, unspecified: Secondary | ICD-10-CM | POA: Diagnosis not present

## 2013-12-10 DIAGNOSIS — N186 End stage renal disease: Secondary | ICD-10-CM | POA: Diagnosis not present

## 2013-12-10 DIAGNOSIS — D631 Anemia in chronic kidney disease: Secondary | ICD-10-CM | POA: Diagnosis not present

## 2013-12-10 DIAGNOSIS — D509 Iron deficiency anemia, unspecified: Secondary | ICD-10-CM | POA: Diagnosis not present

## 2013-12-11 DIAGNOSIS — D509 Iron deficiency anemia, unspecified: Secondary | ICD-10-CM | POA: Diagnosis not present

## 2013-12-11 DIAGNOSIS — D631 Anemia in chronic kidney disease: Secondary | ICD-10-CM | POA: Diagnosis not present

## 2013-12-11 DIAGNOSIS — N186 End stage renal disease: Secondary | ICD-10-CM | POA: Diagnosis not present

## 2013-12-12 DIAGNOSIS — D631 Anemia in chronic kidney disease: Secondary | ICD-10-CM | POA: Diagnosis not present

## 2013-12-12 DIAGNOSIS — D509 Iron deficiency anemia, unspecified: Secondary | ICD-10-CM | POA: Diagnosis not present

## 2013-12-12 DIAGNOSIS — N186 End stage renal disease: Secondary | ICD-10-CM | POA: Diagnosis not present

## 2013-12-13 DIAGNOSIS — D509 Iron deficiency anemia, unspecified: Secondary | ICD-10-CM | POA: Diagnosis not present

## 2013-12-13 DIAGNOSIS — N186 End stage renal disease: Secondary | ICD-10-CM | POA: Diagnosis not present

## 2013-12-13 DIAGNOSIS — D631 Anemia in chronic kidney disease: Secondary | ICD-10-CM | POA: Diagnosis not present

## 2013-12-14 DIAGNOSIS — N186 End stage renal disease: Secondary | ICD-10-CM | POA: Diagnosis not present

## 2013-12-14 DIAGNOSIS — D509 Iron deficiency anemia, unspecified: Secondary | ICD-10-CM | POA: Diagnosis not present

## 2013-12-14 DIAGNOSIS — D631 Anemia in chronic kidney disease: Secondary | ICD-10-CM | POA: Diagnosis not present

## 2013-12-15 DIAGNOSIS — D631 Anemia in chronic kidney disease: Secondary | ICD-10-CM | POA: Diagnosis not present

## 2013-12-15 DIAGNOSIS — N186 End stage renal disease: Secondary | ICD-10-CM | POA: Diagnosis not present

## 2013-12-15 DIAGNOSIS — D509 Iron deficiency anemia, unspecified: Secondary | ICD-10-CM | POA: Diagnosis not present

## 2013-12-16 DIAGNOSIS — N186 End stage renal disease: Secondary | ICD-10-CM | POA: Diagnosis not present

## 2013-12-16 DIAGNOSIS — D631 Anemia in chronic kidney disease: Secondary | ICD-10-CM | POA: Diagnosis not present

## 2013-12-16 DIAGNOSIS — D509 Iron deficiency anemia, unspecified: Secondary | ICD-10-CM | POA: Diagnosis not present

## 2013-12-17 DIAGNOSIS — N186 End stage renal disease: Secondary | ICD-10-CM | POA: Diagnosis not present

## 2013-12-17 DIAGNOSIS — D509 Iron deficiency anemia, unspecified: Secondary | ICD-10-CM | POA: Diagnosis not present

## 2013-12-17 DIAGNOSIS — D631 Anemia in chronic kidney disease: Secondary | ICD-10-CM | POA: Diagnosis not present

## 2013-12-17 DIAGNOSIS — N039 Chronic nephritic syndrome with unspecified morphologic changes: Secondary | ICD-10-CM | POA: Diagnosis not present

## 2013-12-18 DIAGNOSIS — N039 Chronic nephritic syndrome with unspecified morphologic changes: Secondary | ICD-10-CM | POA: Diagnosis not present

## 2013-12-18 DIAGNOSIS — N186 End stage renal disease: Secondary | ICD-10-CM | POA: Diagnosis not present

## 2013-12-18 DIAGNOSIS — D631 Anemia in chronic kidney disease: Secondary | ICD-10-CM | POA: Diagnosis not present

## 2013-12-18 DIAGNOSIS — D509 Iron deficiency anemia, unspecified: Secondary | ICD-10-CM | POA: Diagnosis not present

## 2013-12-19 DIAGNOSIS — N186 End stage renal disease: Secondary | ICD-10-CM | POA: Diagnosis not present

## 2013-12-19 DIAGNOSIS — D509 Iron deficiency anemia, unspecified: Secondary | ICD-10-CM | POA: Diagnosis not present

## 2013-12-19 DIAGNOSIS — D631 Anemia in chronic kidney disease: Secondary | ICD-10-CM | POA: Diagnosis not present

## 2013-12-20 DIAGNOSIS — D509 Iron deficiency anemia, unspecified: Secondary | ICD-10-CM | POA: Diagnosis not present

## 2013-12-20 DIAGNOSIS — D631 Anemia in chronic kidney disease: Secondary | ICD-10-CM | POA: Diagnosis not present

## 2013-12-20 DIAGNOSIS — N186 End stage renal disease: Secondary | ICD-10-CM | POA: Diagnosis not present

## 2013-12-21 DIAGNOSIS — D631 Anemia in chronic kidney disease: Secondary | ICD-10-CM | POA: Diagnosis not present

## 2013-12-21 DIAGNOSIS — D509 Iron deficiency anemia, unspecified: Secondary | ICD-10-CM | POA: Diagnosis not present

## 2013-12-21 DIAGNOSIS — N186 End stage renal disease: Secondary | ICD-10-CM | POA: Diagnosis not present

## 2013-12-22 DIAGNOSIS — N186 End stage renal disease: Secondary | ICD-10-CM | POA: Diagnosis not present

## 2013-12-22 DIAGNOSIS — D509 Iron deficiency anemia, unspecified: Secondary | ICD-10-CM | POA: Diagnosis not present

## 2013-12-22 DIAGNOSIS — N039 Chronic nephritic syndrome with unspecified morphologic changes: Secondary | ICD-10-CM | POA: Diagnosis not present

## 2013-12-22 DIAGNOSIS — D631 Anemia in chronic kidney disease: Secondary | ICD-10-CM | POA: Diagnosis not present

## 2013-12-23 DIAGNOSIS — D631 Anemia in chronic kidney disease: Secondary | ICD-10-CM | POA: Diagnosis not present

## 2013-12-23 DIAGNOSIS — D509 Iron deficiency anemia, unspecified: Secondary | ICD-10-CM | POA: Diagnosis not present

## 2013-12-23 DIAGNOSIS — N186 End stage renal disease: Secondary | ICD-10-CM | POA: Diagnosis not present

## 2013-12-24 DIAGNOSIS — N186 End stage renal disease: Secondary | ICD-10-CM | POA: Diagnosis not present

## 2013-12-25 DIAGNOSIS — N186 End stage renal disease: Secondary | ICD-10-CM | POA: Diagnosis not present

## 2013-12-26 DIAGNOSIS — N186 End stage renal disease: Secondary | ICD-10-CM | POA: Diagnosis not present

## 2013-12-27 DIAGNOSIS — N186 End stage renal disease: Secondary | ICD-10-CM | POA: Diagnosis not present

## 2013-12-27 DIAGNOSIS — D509 Iron deficiency anemia, unspecified: Secondary | ICD-10-CM | POA: Diagnosis not present

## 2013-12-27 DIAGNOSIS — N039 Chronic nephritic syndrome with unspecified morphologic changes: Secondary | ICD-10-CM | POA: Diagnosis not present

## 2013-12-27 DIAGNOSIS — D631 Anemia in chronic kidney disease: Secondary | ICD-10-CM | POA: Diagnosis not present

## 2013-12-28 DIAGNOSIS — N039 Chronic nephritic syndrome with unspecified morphologic changes: Secondary | ICD-10-CM | POA: Diagnosis not present

## 2013-12-28 DIAGNOSIS — D509 Iron deficiency anemia, unspecified: Secondary | ICD-10-CM | POA: Diagnosis not present

## 2013-12-28 DIAGNOSIS — N186 End stage renal disease: Secondary | ICD-10-CM | POA: Diagnosis not present

## 2013-12-28 DIAGNOSIS — D631 Anemia in chronic kidney disease: Secondary | ICD-10-CM | POA: Diagnosis not present

## 2013-12-29 DIAGNOSIS — D509 Iron deficiency anemia, unspecified: Secondary | ICD-10-CM | POA: Diagnosis not present

## 2013-12-29 DIAGNOSIS — N186 End stage renal disease: Secondary | ICD-10-CM | POA: Diagnosis not present

## 2013-12-29 DIAGNOSIS — D631 Anemia in chronic kidney disease: Secondary | ICD-10-CM | POA: Diagnosis not present

## 2013-12-30 DIAGNOSIS — D631 Anemia in chronic kidney disease: Secondary | ICD-10-CM | POA: Diagnosis not present

## 2013-12-30 DIAGNOSIS — D509 Iron deficiency anemia, unspecified: Secondary | ICD-10-CM | POA: Diagnosis not present

## 2013-12-30 DIAGNOSIS — N186 End stage renal disease: Secondary | ICD-10-CM | POA: Diagnosis not present

## 2013-12-31 DIAGNOSIS — N186 End stage renal disease: Secondary | ICD-10-CM | POA: Diagnosis not present

## 2013-12-31 DIAGNOSIS — D631 Anemia in chronic kidney disease: Secondary | ICD-10-CM | POA: Diagnosis not present

## 2013-12-31 DIAGNOSIS — D509 Iron deficiency anemia, unspecified: Secondary | ICD-10-CM | POA: Diagnosis not present

## 2014-01-01 DIAGNOSIS — D509 Iron deficiency anemia, unspecified: Secondary | ICD-10-CM | POA: Diagnosis not present

## 2014-01-01 DIAGNOSIS — D631 Anemia in chronic kidney disease: Secondary | ICD-10-CM | POA: Diagnosis not present

## 2014-01-01 DIAGNOSIS — N186 End stage renal disease: Secondary | ICD-10-CM | POA: Diagnosis not present

## 2014-01-01 DIAGNOSIS — N039 Chronic nephritic syndrome with unspecified morphologic changes: Secondary | ICD-10-CM | POA: Diagnosis not present

## 2014-01-02 DIAGNOSIS — D631 Anemia in chronic kidney disease: Secondary | ICD-10-CM | POA: Diagnosis not present

## 2014-01-02 DIAGNOSIS — D509 Iron deficiency anemia, unspecified: Secondary | ICD-10-CM | POA: Diagnosis not present

## 2014-01-02 DIAGNOSIS — N186 End stage renal disease: Secondary | ICD-10-CM | POA: Diagnosis not present

## 2014-01-02 DIAGNOSIS — N039 Chronic nephritic syndrome with unspecified morphologic changes: Secondary | ICD-10-CM | POA: Diagnosis not present

## 2014-01-03 DIAGNOSIS — N186 End stage renal disease: Secondary | ICD-10-CM | POA: Diagnosis not present

## 2014-01-03 DIAGNOSIS — D509 Iron deficiency anemia, unspecified: Secondary | ICD-10-CM | POA: Diagnosis not present

## 2014-01-03 DIAGNOSIS — N039 Chronic nephritic syndrome with unspecified morphologic changes: Secondary | ICD-10-CM | POA: Diagnosis not present

## 2014-01-03 DIAGNOSIS — D631 Anemia in chronic kidney disease: Secondary | ICD-10-CM | POA: Diagnosis not present

## 2014-01-04 DIAGNOSIS — N186 End stage renal disease: Secondary | ICD-10-CM | POA: Diagnosis not present

## 2014-01-04 DIAGNOSIS — D509 Iron deficiency anemia, unspecified: Secondary | ICD-10-CM | POA: Diagnosis not present

## 2014-01-04 DIAGNOSIS — D631 Anemia in chronic kidney disease: Secondary | ICD-10-CM | POA: Diagnosis not present

## 2014-01-05 DIAGNOSIS — N039 Chronic nephritic syndrome with unspecified morphologic changes: Secondary | ICD-10-CM | POA: Diagnosis not present

## 2014-01-05 DIAGNOSIS — D509 Iron deficiency anemia, unspecified: Secondary | ICD-10-CM | POA: Diagnosis not present

## 2014-01-05 DIAGNOSIS — N186 End stage renal disease: Secondary | ICD-10-CM | POA: Diagnosis not present

## 2014-01-05 DIAGNOSIS — D631 Anemia in chronic kidney disease: Secondary | ICD-10-CM | POA: Diagnosis not present

## 2014-01-06 DIAGNOSIS — D509 Iron deficiency anemia, unspecified: Secondary | ICD-10-CM | POA: Diagnosis not present

## 2014-01-06 DIAGNOSIS — D631 Anemia in chronic kidney disease: Secondary | ICD-10-CM | POA: Diagnosis not present

## 2014-01-06 DIAGNOSIS — N186 End stage renal disease: Secondary | ICD-10-CM | POA: Diagnosis not present

## 2014-01-07 DIAGNOSIS — N186 End stage renal disease: Secondary | ICD-10-CM | POA: Diagnosis not present

## 2014-01-07 DIAGNOSIS — D509 Iron deficiency anemia, unspecified: Secondary | ICD-10-CM | POA: Diagnosis not present

## 2014-01-07 DIAGNOSIS — D631 Anemia in chronic kidney disease: Secondary | ICD-10-CM | POA: Diagnosis not present

## 2014-01-08 DIAGNOSIS — D509 Iron deficiency anemia, unspecified: Secondary | ICD-10-CM | POA: Diagnosis not present

## 2014-01-08 DIAGNOSIS — D631 Anemia in chronic kidney disease: Secondary | ICD-10-CM | POA: Diagnosis not present

## 2014-01-08 DIAGNOSIS — N186 End stage renal disease: Secondary | ICD-10-CM | POA: Diagnosis not present

## 2014-01-09 DIAGNOSIS — D631 Anemia in chronic kidney disease: Secondary | ICD-10-CM | POA: Diagnosis not present

## 2014-01-09 DIAGNOSIS — N186 End stage renal disease: Secondary | ICD-10-CM | POA: Diagnosis not present

## 2014-01-09 DIAGNOSIS — D509 Iron deficiency anemia, unspecified: Secondary | ICD-10-CM | POA: Diagnosis not present

## 2014-01-10 DIAGNOSIS — N039 Chronic nephritic syndrome with unspecified morphologic changes: Secondary | ICD-10-CM | POA: Diagnosis not present

## 2014-01-10 DIAGNOSIS — N186 End stage renal disease: Secondary | ICD-10-CM | POA: Diagnosis not present

## 2014-01-10 DIAGNOSIS — D631 Anemia in chronic kidney disease: Secondary | ICD-10-CM | POA: Diagnosis not present

## 2014-01-10 DIAGNOSIS — D509 Iron deficiency anemia, unspecified: Secondary | ICD-10-CM | POA: Diagnosis not present

## 2014-01-11 DIAGNOSIS — D631 Anemia in chronic kidney disease: Secondary | ICD-10-CM | POA: Diagnosis not present

## 2014-01-11 DIAGNOSIS — N186 End stage renal disease: Secondary | ICD-10-CM | POA: Diagnosis not present

## 2014-01-11 DIAGNOSIS — N039 Chronic nephritic syndrome with unspecified morphologic changes: Secondary | ICD-10-CM | POA: Diagnosis not present

## 2014-01-11 DIAGNOSIS — D509 Iron deficiency anemia, unspecified: Secondary | ICD-10-CM | POA: Diagnosis not present

## 2014-01-12 DIAGNOSIS — N186 End stage renal disease: Secondary | ICD-10-CM | POA: Diagnosis not present

## 2014-01-12 DIAGNOSIS — D631 Anemia in chronic kidney disease: Secondary | ICD-10-CM | POA: Diagnosis not present

## 2014-01-12 DIAGNOSIS — D509 Iron deficiency anemia, unspecified: Secondary | ICD-10-CM | POA: Diagnosis not present

## 2014-01-13 DIAGNOSIS — N186 End stage renal disease: Secondary | ICD-10-CM | POA: Diagnosis not present

## 2014-01-13 DIAGNOSIS — D631 Anemia in chronic kidney disease: Secondary | ICD-10-CM | POA: Diagnosis not present

## 2014-01-13 DIAGNOSIS — D509 Iron deficiency anemia, unspecified: Secondary | ICD-10-CM | POA: Diagnosis not present

## 2014-01-13 DIAGNOSIS — N039 Chronic nephritic syndrome with unspecified morphologic changes: Secondary | ICD-10-CM | POA: Diagnosis not present

## 2014-01-14 DIAGNOSIS — D509 Iron deficiency anemia, unspecified: Secondary | ICD-10-CM | POA: Diagnosis not present

## 2014-01-14 DIAGNOSIS — N186 End stage renal disease: Secondary | ICD-10-CM | POA: Diagnosis not present

## 2014-01-14 DIAGNOSIS — D631 Anemia in chronic kidney disease: Secondary | ICD-10-CM | POA: Diagnosis not present

## 2014-01-15 DIAGNOSIS — N186 End stage renal disease: Secondary | ICD-10-CM | POA: Diagnosis not present

## 2014-01-15 DIAGNOSIS — D631 Anemia in chronic kidney disease: Secondary | ICD-10-CM | POA: Diagnosis not present

## 2014-01-15 DIAGNOSIS — D509 Iron deficiency anemia, unspecified: Secondary | ICD-10-CM | POA: Diagnosis not present

## 2014-01-16 DIAGNOSIS — D631 Anemia in chronic kidney disease: Secondary | ICD-10-CM | POA: Diagnosis not present

## 2014-01-16 DIAGNOSIS — D509 Iron deficiency anemia, unspecified: Secondary | ICD-10-CM | POA: Diagnosis not present

## 2014-01-16 DIAGNOSIS — N186 End stage renal disease: Secondary | ICD-10-CM | POA: Diagnosis not present

## 2014-01-17 DIAGNOSIS — D509 Iron deficiency anemia, unspecified: Secondary | ICD-10-CM | POA: Diagnosis not present

## 2014-01-17 DIAGNOSIS — D631 Anemia in chronic kidney disease: Secondary | ICD-10-CM | POA: Diagnosis not present

## 2014-01-17 DIAGNOSIS — N186 End stage renal disease: Secondary | ICD-10-CM | POA: Diagnosis not present

## 2014-01-18 DIAGNOSIS — D631 Anemia in chronic kidney disease: Secondary | ICD-10-CM | POA: Diagnosis not present

## 2014-01-18 DIAGNOSIS — N039 Chronic nephritic syndrome with unspecified morphologic changes: Secondary | ICD-10-CM | POA: Diagnosis not present

## 2014-01-18 DIAGNOSIS — N186 End stage renal disease: Secondary | ICD-10-CM | POA: Diagnosis not present

## 2014-01-18 DIAGNOSIS — D509 Iron deficiency anemia, unspecified: Secondary | ICD-10-CM | POA: Diagnosis not present

## 2014-01-19 DIAGNOSIS — E7800 Pure hypercholesterolemia, unspecified: Secondary | ICD-10-CM | POA: Diagnosis not present

## 2014-01-19 DIAGNOSIS — D509 Iron deficiency anemia, unspecified: Secondary | ICD-10-CM | POA: Diagnosis not present

## 2014-01-19 DIAGNOSIS — N186 End stage renal disease: Secondary | ICD-10-CM | POA: Diagnosis not present

## 2014-01-19 DIAGNOSIS — E119 Type 2 diabetes mellitus without complications: Secondary | ICD-10-CM | POA: Diagnosis not present

## 2014-01-19 DIAGNOSIS — D631 Anemia in chronic kidney disease: Secondary | ICD-10-CM | POA: Diagnosis not present

## 2014-01-19 DIAGNOSIS — I10 Essential (primary) hypertension: Secondary | ICD-10-CM | POA: Diagnosis not present

## 2014-01-20 DIAGNOSIS — D631 Anemia in chronic kidney disease: Secondary | ICD-10-CM | POA: Diagnosis not present

## 2014-01-20 DIAGNOSIS — D509 Iron deficiency anemia, unspecified: Secondary | ICD-10-CM | POA: Diagnosis not present

## 2014-01-20 DIAGNOSIS — N186 End stage renal disease: Secondary | ICD-10-CM | POA: Diagnosis not present

## 2014-01-21 DIAGNOSIS — N186 End stage renal disease: Secondary | ICD-10-CM | POA: Diagnosis not present

## 2014-01-21 DIAGNOSIS — D631 Anemia in chronic kidney disease: Secondary | ICD-10-CM | POA: Diagnosis not present

## 2014-01-21 DIAGNOSIS — N039 Chronic nephritic syndrome with unspecified morphologic changes: Secondary | ICD-10-CM | POA: Diagnosis not present

## 2014-01-21 DIAGNOSIS — D509 Iron deficiency anemia, unspecified: Secondary | ICD-10-CM | POA: Diagnosis not present

## 2014-01-22 DIAGNOSIS — N186 End stage renal disease: Secondary | ICD-10-CM | POA: Diagnosis not present

## 2014-01-22 DIAGNOSIS — D509 Iron deficiency anemia, unspecified: Secondary | ICD-10-CM | POA: Diagnosis not present

## 2014-01-22 DIAGNOSIS — D631 Anemia in chronic kidney disease: Secondary | ICD-10-CM | POA: Diagnosis not present

## 2014-01-23 DIAGNOSIS — D509 Iron deficiency anemia, unspecified: Secondary | ICD-10-CM | POA: Diagnosis not present

## 2014-01-23 DIAGNOSIS — N186 End stage renal disease: Secondary | ICD-10-CM | POA: Diagnosis not present

## 2014-01-23 DIAGNOSIS — D631 Anemia in chronic kidney disease: Secondary | ICD-10-CM | POA: Diagnosis not present

## 2014-01-24 DIAGNOSIS — N186 End stage renal disease: Secondary | ICD-10-CM | POA: Diagnosis not present

## 2014-01-25 DIAGNOSIS — N186 End stage renal disease: Secondary | ICD-10-CM | POA: Diagnosis not present

## 2014-01-26 DIAGNOSIS — N186 End stage renal disease: Secondary | ICD-10-CM | POA: Diagnosis not present

## 2014-01-27 DIAGNOSIS — N186 End stage renal disease: Secondary | ICD-10-CM | POA: Diagnosis not present

## 2014-01-28 DIAGNOSIS — N186 End stage renal disease: Secondary | ICD-10-CM | POA: Diagnosis not present

## 2014-01-29 DIAGNOSIS — N186 End stage renal disease: Secondary | ICD-10-CM | POA: Diagnosis not present

## 2014-01-30 DIAGNOSIS — N186 End stage renal disease: Secondary | ICD-10-CM | POA: Diagnosis not present

## 2014-01-31 DIAGNOSIS — N186 End stage renal disease: Secondary | ICD-10-CM | POA: Diagnosis not present

## 2014-02-01 DIAGNOSIS — N186 End stage renal disease: Secondary | ICD-10-CM | POA: Diagnosis not present

## 2014-02-02 DIAGNOSIS — N186 End stage renal disease: Secondary | ICD-10-CM | POA: Diagnosis not present

## 2014-02-03 DIAGNOSIS — N186 End stage renal disease: Secondary | ICD-10-CM | POA: Diagnosis not present

## 2014-02-04 DIAGNOSIS — Z794 Long term (current) use of insulin: Secondary | ICD-10-CM | POA: Diagnosis not present

## 2014-02-04 DIAGNOSIS — Z992 Dependence on renal dialysis: Secondary | ICD-10-CM | POA: Diagnosis not present

## 2014-02-04 DIAGNOSIS — IMO0002 Reserved for concepts with insufficient information to code with codable children: Secondary | ICD-10-CM | POA: Diagnosis not present

## 2014-02-04 DIAGNOSIS — X58XXXA Exposure to other specified factors, initial encounter: Secondary | ICD-10-CM | POA: Diagnosis not present

## 2014-02-04 DIAGNOSIS — N2581 Secondary hyperparathyroidism of renal origin: Secondary | ICD-10-CM | POA: Diagnosis not present

## 2014-02-04 DIAGNOSIS — D631 Anemia in chronic kidney disease: Secondary | ICD-10-CM | POA: Diagnosis not present

## 2014-02-04 DIAGNOSIS — E119 Type 2 diabetes mellitus without complications: Secondary | ICD-10-CM | POA: Diagnosis not present

## 2014-02-04 DIAGNOSIS — Z872 Personal history of diseases of the skin and subcutaneous tissue: Secondary | ICD-10-CM | POA: Diagnosis not present

## 2014-02-04 DIAGNOSIS — Z79899 Other long term (current) drug therapy: Secondary | ICD-10-CM | POA: Diagnosis not present

## 2014-02-04 DIAGNOSIS — Y92009 Unspecified place in unspecified non-institutional (private) residence as the place of occurrence of the external cause: Secondary | ICD-10-CM | POA: Diagnosis not present

## 2014-02-04 DIAGNOSIS — D509 Iron deficiency anemia, unspecified: Secondary | ICD-10-CM | POA: Diagnosis not present

## 2014-02-04 DIAGNOSIS — S91309A Unspecified open wound, unspecified foot, initial encounter: Secondary | ICD-10-CM | POA: Diagnosis not present

## 2014-02-04 DIAGNOSIS — Z9889 Other specified postprocedural states: Secondary | ICD-10-CM | POA: Diagnosis not present

## 2014-02-04 DIAGNOSIS — R011 Cardiac murmur, unspecified: Secondary | ICD-10-CM | POA: Diagnosis not present

## 2014-02-04 DIAGNOSIS — E7800 Pure hypercholesterolemia, unspecified: Secondary | ICD-10-CM | POA: Diagnosis not present

## 2014-02-04 DIAGNOSIS — L851 Acquired keratosis [keratoderma] palmaris et plantaris: Secondary | ICD-10-CM | POA: Diagnosis not present

## 2014-02-04 DIAGNOSIS — I120 Hypertensive chronic kidney disease with stage 5 chronic kidney disease or end stage renal disease: Secondary | ICD-10-CM | POA: Diagnosis not present

## 2014-02-04 DIAGNOSIS — N186 End stage renal disease: Secondary | ICD-10-CM | POA: Diagnosis not present

## 2014-02-04 DIAGNOSIS — N039 Chronic nephritic syndrome with unspecified morphologic changes: Secondary | ICD-10-CM | POA: Diagnosis not present

## 2014-02-05 DIAGNOSIS — N186 End stage renal disease: Secondary | ICD-10-CM | POA: Diagnosis not present

## 2014-02-06 DIAGNOSIS — N186 End stage renal disease: Secondary | ICD-10-CM | POA: Diagnosis not present

## 2014-02-06 DIAGNOSIS — E8779 Other fluid overload: Secondary | ICD-10-CM | POA: Diagnosis not present

## 2014-02-10 DIAGNOSIS — Z992 Dependence on renal dialysis: Secondary | ICD-10-CM | POA: Diagnosis not present

## 2014-02-10 DIAGNOSIS — E7800 Pure hypercholesterolemia, unspecified: Secondary | ICD-10-CM | POA: Diagnosis not present

## 2014-02-10 DIAGNOSIS — E119 Type 2 diabetes mellitus without complications: Secondary | ICD-10-CM | POA: Diagnosis not present

## 2014-02-10 DIAGNOSIS — I120 Hypertensive chronic kidney disease with stage 5 chronic kidney disease or end stage renal disease: Secondary | ICD-10-CM | POA: Diagnosis not present

## 2014-02-10 DIAGNOSIS — R112 Nausea with vomiting, unspecified: Secondary | ICD-10-CM | POA: Diagnosis not present

## 2014-02-10 DIAGNOSIS — Z8249 Family history of ischemic heart disease and other diseases of the circulatory system: Secondary | ICD-10-CM | POA: Diagnosis not present

## 2014-02-10 DIAGNOSIS — Z79899 Other long term (current) drug therapy: Secondary | ICD-10-CM | POA: Diagnosis not present

## 2014-02-10 DIAGNOSIS — K5289 Other specified noninfective gastroenteritis and colitis: Secondary | ICD-10-CM | POA: Diagnosis not present

## 2014-02-10 DIAGNOSIS — K429 Umbilical hernia without obstruction or gangrene: Secondary | ICD-10-CM | POA: Diagnosis not present

## 2014-02-10 DIAGNOSIS — N186 End stage renal disease: Secondary | ICD-10-CM | POA: Diagnosis not present

## 2014-02-10 DIAGNOSIS — R109 Unspecified abdominal pain: Secondary | ICD-10-CM | POA: Diagnosis not present

## 2014-02-10 DIAGNOSIS — Z833 Family history of diabetes mellitus: Secondary | ICD-10-CM | POA: Diagnosis not present

## 2014-02-10 DIAGNOSIS — K4090 Unilateral inguinal hernia, without obstruction or gangrene, not specified as recurrent: Secondary | ICD-10-CM | POA: Diagnosis not present

## 2014-02-10 DIAGNOSIS — N189 Chronic kidney disease, unspecified: Secondary | ICD-10-CM | POA: Diagnosis not present

## 2014-02-14 DIAGNOSIS — D631 Anemia in chronic kidney disease: Secondary | ICD-10-CM | POA: Diagnosis not present

## 2014-02-14 DIAGNOSIS — N2581 Secondary hyperparathyroidism of renal origin: Secondary | ICD-10-CM | POA: Diagnosis not present

## 2014-02-14 DIAGNOSIS — D509 Iron deficiency anemia, unspecified: Secondary | ICD-10-CM | POA: Diagnosis not present

## 2014-02-14 DIAGNOSIS — N039 Chronic nephritic syndrome with unspecified morphologic changes: Secondary | ICD-10-CM | POA: Diagnosis not present

## 2014-02-14 DIAGNOSIS — N186 End stage renal disease: Secondary | ICD-10-CM | POA: Diagnosis not present

## 2014-02-14 DIAGNOSIS — Z992 Dependence on renal dialysis: Secondary | ICD-10-CM | POA: Diagnosis not present

## 2014-02-16 DIAGNOSIS — N186 End stage renal disease: Secondary | ICD-10-CM | POA: Diagnosis not present

## 2014-02-16 DIAGNOSIS — D509 Iron deficiency anemia, unspecified: Secondary | ICD-10-CM | POA: Diagnosis not present

## 2014-02-16 DIAGNOSIS — D631 Anemia in chronic kidney disease: Secondary | ICD-10-CM | POA: Diagnosis not present

## 2014-02-16 DIAGNOSIS — N2581 Secondary hyperparathyroidism of renal origin: Secondary | ICD-10-CM | POA: Diagnosis not present

## 2014-02-16 DIAGNOSIS — Z992 Dependence on renal dialysis: Secondary | ICD-10-CM | POA: Diagnosis not present

## 2014-02-16 DIAGNOSIS — N039 Chronic nephritic syndrome with unspecified morphologic changes: Secondary | ICD-10-CM | POA: Diagnosis not present

## 2014-02-18 DIAGNOSIS — N186 End stage renal disease: Secondary | ICD-10-CM | POA: Diagnosis not present

## 2014-02-18 DIAGNOSIS — Z992 Dependence on renal dialysis: Secondary | ICD-10-CM | POA: Diagnosis not present

## 2014-02-18 DIAGNOSIS — N039 Chronic nephritic syndrome with unspecified morphologic changes: Secondary | ICD-10-CM | POA: Diagnosis not present

## 2014-02-18 DIAGNOSIS — D631 Anemia in chronic kidney disease: Secondary | ICD-10-CM | POA: Diagnosis not present

## 2014-02-18 DIAGNOSIS — D509 Iron deficiency anemia, unspecified: Secondary | ICD-10-CM | POA: Diagnosis not present

## 2014-02-18 DIAGNOSIS — N2581 Secondary hyperparathyroidism of renal origin: Secondary | ICD-10-CM | POA: Diagnosis not present

## 2014-02-21 DIAGNOSIS — D509 Iron deficiency anemia, unspecified: Secondary | ICD-10-CM | POA: Diagnosis not present

## 2014-02-21 DIAGNOSIS — N2581 Secondary hyperparathyroidism of renal origin: Secondary | ICD-10-CM | POA: Diagnosis not present

## 2014-02-21 DIAGNOSIS — Z992 Dependence on renal dialysis: Secondary | ICD-10-CM | POA: Diagnosis not present

## 2014-02-21 DIAGNOSIS — N186 End stage renal disease: Secondary | ICD-10-CM | POA: Diagnosis not present

## 2014-02-21 DIAGNOSIS — D631 Anemia in chronic kidney disease: Secondary | ICD-10-CM | POA: Diagnosis not present

## 2014-02-23 DIAGNOSIS — N2581 Secondary hyperparathyroidism of renal origin: Secondary | ICD-10-CM | POA: Diagnosis not present

## 2014-02-23 DIAGNOSIS — Z23 Encounter for immunization: Secondary | ICD-10-CM | POA: Diagnosis not present

## 2014-02-23 DIAGNOSIS — D631 Anemia in chronic kidney disease: Secondary | ICD-10-CM | POA: Diagnosis not present

## 2014-02-23 DIAGNOSIS — N186 End stage renal disease: Secondary | ICD-10-CM | POA: Diagnosis not present

## 2014-02-23 DIAGNOSIS — D509 Iron deficiency anemia, unspecified: Secondary | ICD-10-CM | POA: Diagnosis not present

## 2014-02-23 DIAGNOSIS — Z992 Dependence on renal dialysis: Secondary | ICD-10-CM | POA: Diagnosis not present

## 2014-03-10 DIAGNOSIS — T829XXA Unspecified complication of cardiac and vascular prosthetic device, implant and graft, initial encounter: Secondary | ICD-10-CM | POA: Diagnosis not present

## 2014-03-10 DIAGNOSIS — N186 End stage renal disease: Secondary | ICD-10-CM | POA: Diagnosis not present

## 2014-03-10 DIAGNOSIS — I10 Essential (primary) hypertension: Secondary | ICD-10-CM | POA: Diagnosis not present

## 2014-03-10 DIAGNOSIS — E785 Hyperlipidemia, unspecified: Secondary | ICD-10-CM | POA: Diagnosis not present

## 2014-03-13 ENCOUNTER — Ambulatory Visit: Payer: Self-pay | Admitting: Vascular Surgery

## 2014-03-13 DIAGNOSIS — E780 Pure hypercholesterolemia: Secondary | ICD-10-CM | POA: Diagnosis not present

## 2014-03-13 DIAGNOSIS — Z01818 Encounter for other preprocedural examination: Secondary | ICD-10-CM | POA: Diagnosis not present

## 2014-03-13 DIAGNOSIS — I10 Essential (primary) hypertension: Secondary | ICD-10-CM | POA: Diagnosis not present

## 2014-03-13 DIAGNOSIS — I517 Cardiomegaly: Secondary | ICD-10-CM | POA: Diagnosis not present

## 2014-03-13 DIAGNOSIS — E669 Obesity, unspecified: Secondary | ICD-10-CM | POA: Diagnosis not present

## 2014-03-13 DIAGNOSIS — I120 Hypertensive chronic kidney disease with stage 5 chronic kidney disease or end stage renal disease: Secondary | ICD-10-CM | POA: Diagnosis not present

## 2014-03-13 DIAGNOSIS — Z01812 Encounter for preprocedural laboratory examination: Secondary | ICD-10-CM | POA: Diagnosis not present

## 2014-03-13 DIAGNOSIS — E119 Type 2 diabetes mellitus without complications: Secondary | ICD-10-CM | POA: Diagnosis not present

## 2014-03-13 DIAGNOSIS — N186 End stage renal disease: Secondary | ICD-10-CM | POA: Diagnosis not present

## 2014-03-13 LAB — CBC
HCT: 36.1 % — ABNORMAL LOW (ref 40.0–52.0)
HGB: 10.8 g/dL — AB (ref 13.0–18.0)
MCH: 26 pg (ref 26.0–34.0)
MCHC: 29.9 g/dL — ABNORMAL LOW (ref 32.0–36.0)
MCV: 87 fL (ref 80–100)
PLATELETS: 203 10*3/uL (ref 150–440)
RBC: 4.16 10*6/uL — ABNORMAL LOW (ref 4.40–5.90)
RDW: 16 % — ABNORMAL HIGH (ref 11.5–14.5)
WBC: 4.5 10*3/uL (ref 3.8–10.6)

## 2014-03-13 LAB — BASIC METABOLIC PANEL
ANION GAP: 11 (ref 7–16)
BUN: 45 mg/dL — AB (ref 7–18)
CHLORIDE: 101 mmol/L (ref 98–107)
CREATININE: 9.58 mg/dL — AB (ref 0.60–1.30)
Calcium, Total: 8.2 mg/dL — ABNORMAL LOW (ref 8.5–10.1)
Co2: 30 mmol/L (ref 21–32)
EGFR (African American): 7 — ABNORMAL LOW
EGFR (Non-African Amer.): 6 — ABNORMAL LOW
Glucose: 217 mg/dL — ABNORMAL HIGH (ref 65–99)
Osmolality: 301 (ref 275–301)
Potassium: 4.1 mmol/L (ref 3.5–5.1)
Sodium: 142 mmol/L (ref 136–145)

## 2014-03-13 LAB — PROTIME-INR
INR: 1
Prothrombin Time: 12.7 secs (ref 11.5–14.7)

## 2014-03-13 LAB — APTT: Activated PTT: 38 secs — ABNORMAL HIGH (ref 23.6–35.9)

## 2014-03-17 ENCOUNTER — Ambulatory Visit: Payer: Self-pay | Admitting: Vascular Surgery

## 2014-03-17 DIAGNOSIS — I120 Hypertensive chronic kidney disease with stage 5 chronic kidney disease or end stage renal disease: Secondary | ICD-10-CM | POA: Diagnosis not present

## 2014-03-17 DIAGNOSIS — Z6833 Body mass index (BMI) 33.0-33.9, adult: Secondary | ICD-10-CM | POA: Diagnosis not present

## 2014-03-17 DIAGNOSIS — T85611A Breakdown (mechanical) of intraperitoneal dialysis catheter, initial encounter: Secondary | ICD-10-CM | POA: Diagnosis not present

## 2014-03-17 DIAGNOSIS — Z833 Family history of diabetes mellitus: Secondary | ICD-10-CM | POA: Diagnosis not present

## 2014-03-17 DIAGNOSIS — E669 Obesity, unspecified: Secondary | ICD-10-CM | POA: Diagnosis not present

## 2014-03-17 DIAGNOSIS — E119 Type 2 diabetes mellitus without complications: Secondary | ICD-10-CM | POA: Diagnosis not present

## 2014-03-17 DIAGNOSIS — Z841 Family history of disorders of kidney and ureter: Secondary | ICD-10-CM | POA: Diagnosis not present

## 2014-03-17 DIAGNOSIS — T82858A Stenosis of vascular prosthetic devices, implants and grafts, initial encounter: Secondary | ICD-10-CM | POA: Diagnosis not present

## 2014-03-17 DIAGNOSIS — N186 End stage renal disease: Secondary | ICD-10-CM | POA: Diagnosis not present

## 2014-03-17 DIAGNOSIS — T85691S Other mechanical complication of intraperitoneal dialysis catheter, sequela: Secondary | ICD-10-CM | POA: Diagnosis not present

## 2014-03-17 DIAGNOSIS — E780 Pure hypercholesterolemia: Secondary | ICD-10-CM | POA: Diagnosis not present

## 2014-03-17 DIAGNOSIS — Z862 Personal history of diseases of the blood and blood-forming organs and certain disorders involving the immune mechanism: Secondary | ICD-10-CM | POA: Diagnosis not present

## 2014-03-17 DIAGNOSIS — E785 Hyperlipidemia, unspecified: Secondary | ICD-10-CM | POA: Diagnosis not present

## 2014-03-17 DIAGNOSIS — Z992 Dependence on renal dialysis: Secondary | ICD-10-CM | POA: Diagnosis not present

## 2014-03-17 DIAGNOSIS — Z8249 Family history of ischemic heart disease and other diseases of the circulatory system: Secondary | ICD-10-CM | POA: Diagnosis not present

## 2014-03-17 DIAGNOSIS — Z79899 Other long term (current) drug therapy: Secondary | ICD-10-CM | POA: Diagnosis not present

## 2014-03-25 DIAGNOSIS — Z992 Dependence on renal dialysis: Secondary | ICD-10-CM | POA: Diagnosis not present

## 2014-03-25 DIAGNOSIS — N186 End stage renal disease: Secondary | ICD-10-CM | POA: Diagnosis not present

## 2014-03-28 DIAGNOSIS — N186 End stage renal disease: Secondary | ICD-10-CM | POA: Diagnosis not present

## 2014-03-28 DIAGNOSIS — D631 Anemia in chronic kidney disease: Secondary | ICD-10-CM | POA: Diagnosis not present

## 2014-03-28 DIAGNOSIS — D509 Iron deficiency anemia, unspecified: Secondary | ICD-10-CM | POA: Diagnosis not present

## 2014-03-28 DIAGNOSIS — Z992 Dependence on renal dialysis: Secondary | ICD-10-CM | POA: Diagnosis not present

## 2014-03-28 DIAGNOSIS — N2581 Secondary hyperparathyroidism of renal origin: Secondary | ICD-10-CM | POA: Diagnosis not present

## 2014-03-30 DIAGNOSIS — N2581 Secondary hyperparathyroidism of renal origin: Secondary | ICD-10-CM | POA: Diagnosis not present

## 2014-03-30 DIAGNOSIS — Z992 Dependence on renal dialysis: Secondary | ICD-10-CM | POA: Diagnosis not present

## 2014-03-30 DIAGNOSIS — D631 Anemia in chronic kidney disease: Secondary | ICD-10-CM | POA: Diagnosis not present

## 2014-03-30 DIAGNOSIS — D509 Iron deficiency anemia, unspecified: Secondary | ICD-10-CM | POA: Diagnosis not present

## 2014-03-30 DIAGNOSIS — N186 End stage renal disease: Secondary | ICD-10-CM | POA: Diagnosis not present

## 2014-04-01 DIAGNOSIS — D509 Iron deficiency anemia, unspecified: Secondary | ICD-10-CM | POA: Diagnosis not present

## 2014-04-01 DIAGNOSIS — N186 End stage renal disease: Secondary | ICD-10-CM | POA: Diagnosis not present

## 2014-04-01 DIAGNOSIS — D631 Anemia in chronic kidney disease: Secondary | ICD-10-CM | POA: Diagnosis not present

## 2014-04-01 DIAGNOSIS — N2581 Secondary hyperparathyroidism of renal origin: Secondary | ICD-10-CM | POA: Diagnosis not present

## 2014-04-01 DIAGNOSIS — Z992 Dependence on renal dialysis: Secondary | ICD-10-CM | POA: Diagnosis not present

## 2014-04-04 DIAGNOSIS — N186 End stage renal disease: Secondary | ICD-10-CM | POA: Diagnosis not present

## 2014-04-04 DIAGNOSIS — D509 Iron deficiency anemia, unspecified: Secondary | ICD-10-CM | POA: Diagnosis not present

## 2014-04-04 DIAGNOSIS — N2581 Secondary hyperparathyroidism of renal origin: Secondary | ICD-10-CM | POA: Diagnosis not present

## 2014-04-04 DIAGNOSIS — D631 Anemia in chronic kidney disease: Secondary | ICD-10-CM | POA: Diagnosis not present

## 2014-04-04 DIAGNOSIS — Z992 Dependence on renal dialysis: Secondary | ICD-10-CM | POA: Diagnosis not present

## 2014-04-06 DIAGNOSIS — N2581 Secondary hyperparathyroidism of renal origin: Secondary | ICD-10-CM | POA: Diagnosis not present

## 2014-04-06 DIAGNOSIS — Z992 Dependence on renal dialysis: Secondary | ICD-10-CM | POA: Diagnosis not present

## 2014-04-06 DIAGNOSIS — N186 End stage renal disease: Secondary | ICD-10-CM | POA: Diagnosis not present

## 2014-04-06 DIAGNOSIS — D631 Anemia in chronic kidney disease: Secondary | ICD-10-CM | POA: Diagnosis not present

## 2014-04-06 DIAGNOSIS — D509 Iron deficiency anemia, unspecified: Secondary | ICD-10-CM | POA: Diagnosis not present

## 2014-04-08 DIAGNOSIS — D631 Anemia in chronic kidney disease: Secondary | ICD-10-CM | POA: Diagnosis not present

## 2014-04-08 DIAGNOSIS — N2581 Secondary hyperparathyroidism of renal origin: Secondary | ICD-10-CM | POA: Diagnosis not present

## 2014-04-08 DIAGNOSIS — N186 End stage renal disease: Secondary | ICD-10-CM | POA: Diagnosis not present

## 2014-04-08 DIAGNOSIS — Z992 Dependence on renal dialysis: Secondary | ICD-10-CM | POA: Diagnosis not present

## 2014-04-08 DIAGNOSIS — D509 Iron deficiency anemia, unspecified: Secondary | ICD-10-CM | POA: Diagnosis not present

## 2014-04-11 DIAGNOSIS — D631 Anemia in chronic kidney disease: Secondary | ICD-10-CM | POA: Diagnosis not present

## 2014-04-11 DIAGNOSIS — Z992 Dependence on renal dialysis: Secondary | ICD-10-CM | POA: Diagnosis not present

## 2014-04-11 DIAGNOSIS — D509 Iron deficiency anemia, unspecified: Secondary | ICD-10-CM | POA: Diagnosis not present

## 2014-04-11 DIAGNOSIS — N186 End stage renal disease: Secondary | ICD-10-CM | POA: Diagnosis not present

## 2014-04-11 DIAGNOSIS — N2581 Secondary hyperparathyroidism of renal origin: Secondary | ICD-10-CM | POA: Diagnosis not present

## 2014-04-13 DIAGNOSIS — D509 Iron deficiency anemia, unspecified: Secondary | ICD-10-CM | POA: Diagnosis not present

## 2014-04-13 DIAGNOSIS — D631 Anemia in chronic kidney disease: Secondary | ICD-10-CM | POA: Diagnosis not present

## 2014-04-13 DIAGNOSIS — N186 End stage renal disease: Secondary | ICD-10-CM | POA: Diagnosis not present

## 2014-04-13 DIAGNOSIS — N2581 Secondary hyperparathyroidism of renal origin: Secondary | ICD-10-CM | POA: Diagnosis not present

## 2014-04-13 DIAGNOSIS — Z992 Dependence on renal dialysis: Secondary | ICD-10-CM | POA: Diagnosis not present

## 2014-04-15 DIAGNOSIS — Z992 Dependence on renal dialysis: Secondary | ICD-10-CM | POA: Diagnosis not present

## 2014-04-15 DIAGNOSIS — N186 End stage renal disease: Secondary | ICD-10-CM | POA: Diagnosis not present

## 2014-04-15 DIAGNOSIS — D509 Iron deficiency anemia, unspecified: Secondary | ICD-10-CM | POA: Diagnosis not present

## 2014-04-15 DIAGNOSIS — D631 Anemia in chronic kidney disease: Secondary | ICD-10-CM | POA: Diagnosis not present

## 2014-04-15 DIAGNOSIS — N2581 Secondary hyperparathyroidism of renal origin: Secondary | ICD-10-CM | POA: Diagnosis not present

## 2014-04-18 DIAGNOSIS — E1165 Type 2 diabetes mellitus with hyperglycemia: Secondary | ICD-10-CM | POA: Diagnosis not present

## 2014-04-18 DIAGNOSIS — I10 Essential (primary) hypertension: Secondary | ICD-10-CM | POA: Diagnosis not present

## 2014-04-18 DIAGNOSIS — N186 End stage renal disease: Secondary | ICD-10-CM | POA: Diagnosis not present

## 2014-04-18 DIAGNOSIS — L299 Pruritus, unspecified: Secondary | ICD-10-CM | POA: Diagnosis not present

## 2014-04-18 DIAGNOSIS — N2581 Secondary hyperparathyroidism of renal origin: Secondary | ICD-10-CM | POA: Diagnosis not present

## 2014-04-18 DIAGNOSIS — D631 Anemia in chronic kidney disease: Secondary | ICD-10-CM | POA: Diagnosis not present

## 2014-04-18 DIAGNOSIS — D509 Iron deficiency anemia, unspecified: Secondary | ICD-10-CM | POA: Diagnosis not present

## 2014-04-18 DIAGNOSIS — Z992 Dependence on renal dialysis: Secondary | ICD-10-CM | POA: Diagnosis not present

## 2014-04-19 DIAGNOSIS — N186 End stage renal disease: Secondary | ICD-10-CM | POA: Diagnosis not present

## 2014-04-19 DIAGNOSIS — Y841 Kidney dialysis as the cause of abnormal reaction of the patient, or of later complication, without mention of misadventure at the time of the procedure: Secondary | ICD-10-CM | POA: Diagnosis not present

## 2014-04-20 DIAGNOSIS — D509 Iron deficiency anemia, unspecified: Secondary | ICD-10-CM | POA: Diagnosis not present

## 2014-04-20 DIAGNOSIS — N2581 Secondary hyperparathyroidism of renal origin: Secondary | ICD-10-CM | POA: Diagnosis not present

## 2014-04-20 DIAGNOSIS — N186 End stage renal disease: Secondary | ICD-10-CM | POA: Diagnosis not present

## 2014-04-20 DIAGNOSIS — D631 Anemia in chronic kidney disease: Secondary | ICD-10-CM | POA: Diagnosis not present

## 2014-04-20 DIAGNOSIS — Z992 Dependence on renal dialysis: Secondary | ICD-10-CM | POA: Diagnosis not present

## 2014-04-22 DIAGNOSIS — N2581 Secondary hyperparathyroidism of renal origin: Secondary | ICD-10-CM | POA: Diagnosis not present

## 2014-04-22 DIAGNOSIS — Z992 Dependence on renal dialysis: Secondary | ICD-10-CM | POA: Diagnosis not present

## 2014-04-22 DIAGNOSIS — D509 Iron deficiency anemia, unspecified: Secondary | ICD-10-CM | POA: Diagnosis not present

## 2014-04-22 DIAGNOSIS — D631 Anemia in chronic kidney disease: Secondary | ICD-10-CM | POA: Diagnosis not present

## 2014-04-22 DIAGNOSIS — N186 End stage renal disease: Secondary | ICD-10-CM | POA: Diagnosis not present

## 2014-04-24 DIAGNOSIS — N186 End stage renal disease: Secondary | ICD-10-CM | POA: Diagnosis not present

## 2014-04-24 DIAGNOSIS — Z992 Dependence on renal dialysis: Secondary | ICD-10-CM | POA: Diagnosis not present

## 2014-04-25 DIAGNOSIS — Z992 Dependence on renal dialysis: Secondary | ICD-10-CM | POA: Diagnosis not present

## 2014-04-25 DIAGNOSIS — N2581 Secondary hyperparathyroidism of renal origin: Secondary | ICD-10-CM | POA: Diagnosis not present

## 2014-04-25 DIAGNOSIS — N186 End stage renal disease: Secondary | ICD-10-CM | POA: Diagnosis not present

## 2014-04-25 DIAGNOSIS — D631 Anemia in chronic kidney disease: Secondary | ICD-10-CM | POA: Diagnosis not present

## 2014-04-25 DIAGNOSIS — D509 Iron deficiency anemia, unspecified: Secondary | ICD-10-CM | POA: Diagnosis not present

## 2014-05-09 DIAGNOSIS — L309 Dermatitis, unspecified: Secondary | ICD-10-CM | POA: Diagnosis not present

## 2014-05-25 DIAGNOSIS — N186 End stage renal disease: Secondary | ICD-10-CM | POA: Diagnosis not present

## 2014-05-25 DIAGNOSIS — Z992 Dependence on renal dialysis: Secondary | ICD-10-CM | POA: Diagnosis not present

## 2014-05-27 DIAGNOSIS — D509 Iron deficiency anemia, unspecified: Secondary | ICD-10-CM | POA: Diagnosis not present

## 2014-05-27 DIAGNOSIS — D631 Anemia in chronic kidney disease: Secondary | ICD-10-CM | POA: Diagnosis not present

## 2014-05-27 DIAGNOSIS — N186 End stage renal disease: Secondary | ICD-10-CM | POA: Diagnosis not present

## 2014-05-27 DIAGNOSIS — Z992 Dependence on renal dialysis: Secondary | ICD-10-CM | POA: Diagnosis not present

## 2014-05-27 DIAGNOSIS — N2581 Secondary hyperparathyroidism of renal origin: Secondary | ICD-10-CM | POA: Diagnosis not present

## 2014-05-30 DIAGNOSIS — N2581 Secondary hyperparathyroidism of renal origin: Secondary | ICD-10-CM | POA: Diagnosis not present

## 2014-05-30 DIAGNOSIS — N186 End stage renal disease: Secondary | ICD-10-CM | POA: Diagnosis not present

## 2014-05-30 DIAGNOSIS — D631 Anemia in chronic kidney disease: Secondary | ICD-10-CM | POA: Diagnosis not present

## 2014-05-30 DIAGNOSIS — D509 Iron deficiency anemia, unspecified: Secondary | ICD-10-CM | POA: Diagnosis not present

## 2014-05-30 DIAGNOSIS — Z992 Dependence on renal dialysis: Secondary | ICD-10-CM | POA: Diagnosis not present

## 2014-06-01 DIAGNOSIS — N186 End stage renal disease: Secondary | ICD-10-CM | POA: Diagnosis not present

## 2014-06-01 DIAGNOSIS — D509 Iron deficiency anemia, unspecified: Secondary | ICD-10-CM | POA: Diagnosis not present

## 2014-06-01 DIAGNOSIS — Z992 Dependence on renal dialysis: Secondary | ICD-10-CM | POA: Diagnosis not present

## 2014-06-01 DIAGNOSIS — N2581 Secondary hyperparathyroidism of renal origin: Secondary | ICD-10-CM | POA: Diagnosis not present

## 2014-06-01 DIAGNOSIS — D631 Anemia in chronic kidney disease: Secondary | ICD-10-CM | POA: Diagnosis not present

## 2014-06-03 DIAGNOSIS — D631 Anemia in chronic kidney disease: Secondary | ICD-10-CM | POA: Diagnosis not present

## 2014-06-03 DIAGNOSIS — Z992 Dependence on renal dialysis: Secondary | ICD-10-CM | POA: Diagnosis not present

## 2014-06-03 DIAGNOSIS — D509 Iron deficiency anemia, unspecified: Secondary | ICD-10-CM | POA: Diagnosis not present

## 2014-06-03 DIAGNOSIS — N186 End stage renal disease: Secondary | ICD-10-CM | POA: Diagnosis not present

## 2014-06-03 DIAGNOSIS — N2581 Secondary hyperparathyroidism of renal origin: Secondary | ICD-10-CM | POA: Diagnosis not present

## 2014-06-06 DIAGNOSIS — D509 Iron deficiency anemia, unspecified: Secondary | ICD-10-CM | POA: Diagnosis not present

## 2014-06-06 DIAGNOSIS — N186 End stage renal disease: Secondary | ICD-10-CM | POA: Diagnosis not present

## 2014-06-06 DIAGNOSIS — Z992 Dependence on renal dialysis: Secondary | ICD-10-CM | POA: Diagnosis not present

## 2014-06-06 DIAGNOSIS — N2581 Secondary hyperparathyroidism of renal origin: Secondary | ICD-10-CM | POA: Diagnosis not present

## 2014-06-06 DIAGNOSIS — D631 Anemia in chronic kidney disease: Secondary | ICD-10-CM | POA: Diagnosis not present

## 2014-06-06 DIAGNOSIS — E119 Type 2 diabetes mellitus without complications: Secondary | ICD-10-CM | POA: Diagnosis not present

## 2014-06-08 DIAGNOSIS — N186 End stage renal disease: Secondary | ICD-10-CM | POA: Diagnosis not present

## 2014-06-08 DIAGNOSIS — N2581 Secondary hyperparathyroidism of renal origin: Secondary | ICD-10-CM | POA: Diagnosis not present

## 2014-06-08 DIAGNOSIS — Z992 Dependence on renal dialysis: Secondary | ICD-10-CM | POA: Diagnosis not present

## 2014-06-08 DIAGNOSIS — D631 Anemia in chronic kidney disease: Secondary | ICD-10-CM | POA: Diagnosis not present

## 2014-06-08 DIAGNOSIS — D509 Iron deficiency anemia, unspecified: Secondary | ICD-10-CM | POA: Diagnosis not present

## 2014-06-10 DIAGNOSIS — Z992 Dependence on renal dialysis: Secondary | ICD-10-CM | POA: Diagnosis not present

## 2014-06-10 DIAGNOSIS — N2581 Secondary hyperparathyroidism of renal origin: Secondary | ICD-10-CM | POA: Diagnosis not present

## 2014-06-10 DIAGNOSIS — N186 End stage renal disease: Secondary | ICD-10-CM | POA: Diagnosis not present

## 2014-06-10 DIAGNOSIS — D509 Iron deficiency anemia, unspecified: Secondary | ICD-10-CM | POA: Diagnosis not present

## 2014-06-10 DIAGNOSIS — D631 Anemia in chronic kidney disease: Secondary | ICD-10-CM | POA: Diagnosis not present

## 2014-06-13 DIAGNOSIS — D509 Iron deficiency anemia, unspecified: Secondary | ICD-10-CM | POA: Diagnosis not present

## 2014-06-13 DIAGNOSIS — N2581 Secondary hyperparathyroidism of renal origin: Secondary | ICD-10-CM | POA: Diagnosis not present

## 2014-06-13 DIAGNOSIS — D631 Anemia in chronic kidney disease: Secondary | ICD-10-CM | POA: Diagnosis not present

## 2014-06-13 DIAGNOSIS — N186 End stage renal disease: Secondary | ICD-10-CM | POA: Diagnosis not present

## 2014-06-13 DIAGNOSIS — Z992 Dependence on renal dialysis: Secondary | ICD-10-CM | POA: Diagnosis not present

## 2014-06-15 DIAGNOSIS — Z992 Dependence on renal dialysis: Secondary | ICD-10-CM | POA: Diagnosis not present

## 2014-06-15 DIAGNOSIS — N2581 Secondary hyperparathyroidism of renal origin: Secondary | ICD-10-CM | POA: Diagnosis not present

## 2014-06-15 DIAGNOSIS — D509 Iron deficiency anemia, unspecified: Secondary | ICD-10-CM | POA: Diagnosis not present

## 2014-06-15 DIAGNOSIS — D631 Anemia in chronic kidney disease: Secondary | ICD-10-CM | POA: Diagnosis not present

## 2014-06-15 DIAGNOSIS — N186 End stage renal disease: Secondary | ICD-10-CM | POA: Diagnosis not present

## 2014-06-18 DIAGNOSIS — N186 End stage renal disease: Secondary | ICD-10-CM | POA: Diagnosis not present

## 2014-06-18 DIAGNOSIS — N2581 Secondary hyperparathyroidism of renal origin: Secondary | ICD-10-CM | POA: Diagnosis not present

## 2014-06-18 DIAGNOSIS — D631 Anemia in chronic kidney disease: Secondary | ICD-10-CM | POA: Diagnosis not present

## 2014-06-18 DIAGNOSIS — D509 Iron deficiency anemia, unspecified: Secondary | ICD-10-CM | POA: Diagnosis not present

## 2014-06-18 DIAGNOSIS — Z992 Dependence on renal dialysis: Secondary | ICD-10-CM | POA: Diagnosis not present

## 2014-06-20 DIAGNOSIS — D631 Anemia in chronic kidney disease: Secondary | ICD-10-CM | POA: Diagnosis not present

## 2014-06-20 DIAGNOSIS — Z992 Dependence on renal dialysis: Secondary | ICD-10-CM | POA: Diagnosis not present

## 2014-06-20 DIAGNOSIS — D509 Iron deficiency anemia, unspecified: Secondary | ICD-10-CM | POA: Diagnosis not present

## 2014-06-20 DIAGNOSIS — N186 End stage renal disease: Secondary | ICD-10-CM | POA: Diagnosis not present

## 2014-06-20 DIAGNOSIS — N2581 Secondary hyperparathyroidism of renal origin: Secondary | ICD-10-CM | POA: Diagnosis not present

## 2014-06-22 DIAGNOSIS — Z992 Dependence on renal dialysis: Secondary | ICD-10-CM | POA: Diagnosis not present

## 2014-06-22 DIAGNOSIS — D631 Anemia in chronic kidney disease: Secondary | ICD-10-CM | POA: Diagnosis not present

## 2014-06-22 DIAGNOSIS — N186 End stage renal disease: Secondary | ICD-10-CM | POA: Diagnosis not present

## 2014-06-22 DIAGNOSIS — N2581 Secondary hyperparathyroidism of renal origin: Secondary | ICD-10-CM | POA: Diagnosis not present

## 2014-06-22 DIAGNOSIS — D509 Iron deficiency anemia, unspecified: Secondary | ICD-10-CM | POA: Diagnosis not present

## 2014-06-24 DIAGNOSIS — N2581 Secondary hyperparathyroidism of renal origin: Secondary | ICD-10-CM | POA: Diagnosis not present

## 2014-06-24 DIAGNOSIS — D509 Iron deficiency anemia, unspecified: Secondary | ICD-10-CM | POA: Diagnosis not present

## 2014-06-24 DIAGNOSIS — Z992 Dependence on renal dialysis: Secondary | ICD-10-CM | POA: Diagnosis not present

## 2014-06-24 DIAGNOSIS — N186 End stage renal disease: Secondary | ICD-10-CM | POA: Diagnosis not present

## 2014-06-24 DIAGNOSIS — D631 Anemia in chronic kidney disease: Secondary | ICD-10-CM | POA: Diagnosis not present

## 2014-06-25 DIAGNOSIS — N186 End stage renal disease: Secondary | ICD-10-CM | POA: Diagnosis not present

## 2014-06-25 DIAGNOSIS — Z992 Dependence on renal dialysis: Secondary | ICD-10-CM | POA: Diagnosis not present

## 2014-06-26 DIAGNOSIS — Z01818 Encounter for other preprocedural examination: Secondary | ICD-10-CM | POA: Diagnosis not present

## 2014-06-27 DIAGNOSIS — N186 End stage renal disease: Secondary | ICD-10-CM | POA: Diagnosis not present

## 2014-06-27 DIAGNOSIS — D631 Anemia in chronic kidney disease: Secondary | ICD-10-CM | POA: Diagnosis not present

## 2014-06-27 DIAGNOSIS — Z992 Dependence on renal dialysis: Secondary | ICD-10-CM | POA: Diagnosis not present

## 2014-06-27 DIAGNOSIS — D509 Iron deficiency anemia, unspecified: Secondary | ICD-10-CM | POA: Diagnosis not present

## 2014-06-27 DIAGNOSIS — N2581 Secondary hyperparathyroidism of renal origin: Secondary | ICD-10-CM | POA: Diagnosis not present

## 2014-06-29 DIAGNOSIS — D509 Iron deficiency anemia, unspecified: Secondary | ICD-10-CM | POA: Diagnosis not present

## 2014-06-29 DIAGNOSIS — Z992 Dependence on renal dialysis: Secondary | ICD-10-CM | POA: Diagnosis not present

## 2014-06-29 DIAGNOSIS — D631 Anemia in chronic kidney disease: Secondary | ICD-10-CM | POA: Diagnosis not present

## 2014-06-29 DIAGNOSIS — N186 End stage renal disease: Secondary | ICD-10-CM | POA: Diagnosis not present

## 2014-06-29 DIAGNOSIS — N2581 Secondary hyperparathyroidism of renal origin: Secondary | ICD-10-CM | POA: Diagnosis not present

## 2014-07-01 DIAGNOSIS — D631 Anemia in chronic kidney disease: Secondary | ICD-10-CM | POA: Diagnosis not present

## 2014-07-01 DIAGNOSIS — N2581 Secondary hyperparathyroidism of renal origin: Secondary | ICD-10-CM | POA: Diagnosis not present

## 2014-07-01 DIAGNOSIS — D509 Iron deficiency anemia, unspecified: Secondary | ICD-10-CM | POA: Diagnosis not present

## 2014-07-01 DIAGNOSIS — Z992 Dependence on renal dialysis: Secondary | ICD-10-CM | POA: Diagnosis not present

## 2014-07-01 DIAGNOSIS — N186 End stage renal disease: Secondary | ICD-10-CM | POA: Diagnosis not present

## 2014-07-04 DIAGNOSIS — D631 Anemia in chronic kidney disease: Secondary | ICD-10-CM | POA: Diagnosis not present

## 2014-07-04 DIAGNOSIS — N186 End stage renal disease: Secondary | ICD-10-CM | POA: Diagnosis not present

## 2014-07-04 DIAGNOSIS — D509 Iron deficiency anemia, unspecified: Secondary | ICD-10-CM | POA: Diagnosis not present

## 2014-07-04 DIAGNOSIS — N2581 Secondary hyperparathyroidism of renal origin: Secondary | ICD-10-CM | POA: Diagnosis not present

## 2014-07-04 DIAGNOSIS — Z992 Dependence on renal dialysis: Secondary | ICD-10-CM | POA: Diagnosis not present

## 2014-07-06 DIAGNOSIS — Z992 Dependence on renal dialysis: Secondary | ICD-10-CM | POA: Diagnosis not present

## 2014-07-06 DIAGNOSIS — D509 Iron deficiency anemia, unspecified: Secondary | ICD-10-CM | POA: Diagnosis not present

## 2014-07-06 DIAGNOSIS — N186 End stage renal disease: Secondary | ICD-10-CM | POA: Diagnosis not present

## 2014-07-06 DIAGNOSIS — N2581 Secondary hyperparathyroidism of renal origin: Secondary | ICD-10-CM | POA: Diagnosis not present

## 2014-07-06 DIAGNOSIS — D631 Anemia in chronic kidney disease: Secondary | ICD-10-CM | POA: Diagnosis not present

## 2014-07-08 DIAGNOSIS — N2581 Secondary hyperparathyroidism of renal origin: Secondary | ICD-10-CM | POA: Diagnosis not present

## 2014-07-08 DIAGNOSIS — D631 Anemia in chronic kidney disease: Secondary | ICD-10-CM | POA: Diagnosis not present

## 2014-07-08 DIAGNOSIS — D509 Iron deficiency anemia, unspecified: Secondary | ICD-10-CM | POA: Diagnosis not present

## 2014-07-08 DIAGNOSIS — N186 End stage renal disease: Secondary | ICD-10-CM | POA: Diagnosis not present

## 2014-07-08 DIAGNOSIS — Z992 Dependence on renal dialysis: Secondary | ICD-10-CM | POA: Diagnosis not present

## 2014-07-11 DIAGNOSIS — N186 End stage renal disease: Secondary | ICD-10-CM | POA: Diagnosis not present

## 2014-07-11 DIAGNOSIS — D509 Iron deficiency anemia, unspecified: Secondary | ICD-10-CM | POA: Diagnosis not present

## 2014-07-11 DIAGNOSIS — N2581 Secondary hyperparathyroidism of renal origin: Secondary | ICD-10-CM | POA: Diagnosis not present

## 2014-07-11 DIAGNOSIS — D631 Anemia in chronic kidney disease: Secondary | ICD-10-CM | POA: Diagnosis not present

## 2014-07-11 DIAGNOSIS — Z992 Dependence on renal dialysis: Secondary | ICD-10-CM | POA: Diagnosis not present

## 2014-07-13 DIAGNOSIS — N186 End stage renal disease: Secondary | ICD-10-CM | POA: Diagnosis not present

## 2014-07-13 DIAGNOSIS — Z992 Dependence on renal dialysis: Secondary | ICD-10-CM | POA: Diagnosis not present

## 2014-07-13 DIAGNOSIS — N2581 Secondary hyperparathyroidism of renal origin: Secondary | ICD-10-CM | POA: Diagnosis not present

## 2014-07-13 DIAGNOSIS — D631 Anemia in chronic kidney disease: Secondary | ICD-10-CM | POA: Diagnosis not present

## 2014-07-13 DIAGNOSIS — D509 Iron deficiency anemia, unspecified: Secondary | ICD-10-CM | POA: Diagnosis not present

## 2014-07-15 DIAGNOSIS — N2581 Secondary hyperparathyroidism of renal origin: Secondary | ICD-10-CM | POA: Diagnosis not present

## 2014-07-15 DIAGNOSIS — D509 Iron deficiency anemia, unspecified: Secondary | ICD-10-CM | POA: Diagnosis not present

## 2014-07-15 DIAGNOSIS — Z992 Dependence on renal dialysis: Secondary | ICD-10-CM | POA: Diagnosis not present

## 2014-07-15 DIAGNOSIS — D631 Anemia in chronic kidney disease: Secondary | ICD-10-CM | POA: Diagnosis not present

## 2014-07-15 DIAGNOSIS — N186 End stage renal disease: Secondary | ICD-10-CM | POA: Diagnosis not present

## 2014-07-18 DIAGNOSIS — N2581 Secondary hyperparathyroidism of renal origin: Secondary | ICD-10-CM | POA: Diagnosis not present

## 2014-07-18 DIAGNOSIS — Z992 Dependence on renal dialysis: Secondary | ICD-10-CM | POA: Diagnosis not present

## 2014-07-18 DIAGNOSIS — D631 Anemia in chronic kidney disease: Secondary | ICD-10-CM | POA: Diagnosis not present

## 2014-07-18 DIAGNOSIS — N186 End stage renal disease: Secondary | ICD-10-CM | POA: Diagnosis not present

## 2014-07-18 DIAGNOSIS — D509 Iron deficiency anemia, unspecified: Secondary | ICD-10-CM | POA: Diagnosis not present

## 2014-07-20 DIAGNOSIS — N2581 Secondary hyperparathyroidism of renal origin: Secondary | ICD-10-CM | POA: Diagnosis not present

## 2014-07-20 DIAGNOSIS — D509 Iron deficiency anemia, unspecified: Secondary | ICD-10-CM | POA: Diagnosis not present

## 2014-07-20 DIAGNOSIS — Z992 Dependence on renal dialysis: Secondary | ICD-10-CM | POA: Diagnosis not present

## 2014-07-20 DIAGNOSIS — D631 Anemia in chronic kidney disease: Secondary | ICD-10-CM | POA: Diagnosis not present

## 2014-07-20 DIAGNOSIS — N186 End stage renal disease: Secondary | ICD-10-CM | POA: Diagnosis not present

## 2014-07-22 DIAGNOSIS — D631 Anemia in chronic kidney disease: Secondary | ICD-10-CM | POA: Diagnosis not present

## 2014-07-22 DIAGNOSIS — N2581 Secondary hyperparathyroidism of renal origin: Secondary | ICD-10-CM | POA: Diagnosis not present

## 2014-07-22 DIAGNOSIS — Z992 Dependence on renal dialysis: Secondary | ICD-10-CM | POA: Diagnosis not present

## 2014-07-22 DIAGNOSIS — N186 End stage renal disease: Secondary | ICD-10-CM | POA: Diagnosis not present

## 2014-07-22 DIAGNOSIS — D509 Iron deficiency anemia, unspecified: Secondary | ICD-10-CM | POA: Diagnosis not present

## 2014-07-24 DIAGNOSIS — N186 End stage renal disease: Secondary | ICD-10-CM | POA: Diagnosis not present

## 2014-07-24 DIAGNOSIS — Z992 Dependence on renal dialysis: Secondary | ICD-10-CM | POA: Diagnosis not present

## 2014-07-25 DIAGNOSIS — D509 Iron deficiency anemia, unspecified: Secondary | ICD-10-CM | POA: Diagnosis not present

## 2014-07-25 DIAGNOSIS — N2581 Secondary hyperparathyroidism of renal origin: Secondary | ICD-10-CM | POA: Diagnosis not present

## 2014-07-25 DIAGNOSIS — N186 End stage renal disease: Secondary | ICD-10-CM | POA: Diagnosis not present

## 2014-07-25 DIAGNOSIS — Z992 Dependence on renal dialysis: Secondary | ICD-10-CM | POA: Diagnosis not present

## 2014-07-25 DIAGNOSIS — D631 Anemia in chronic kidney disease: Secondary | ICD-10-CM | POA: Diagnosis not present

## 2014-07-27 DIAGNOSIS — D509 Iron deficiency anemia, unspecified: Secondary | ICD-10-CM | POA: Diagnosis not present

## 2014-07-27 DIAGNOSIS — Z992 Dependence on renal dialysis: Secondary | ICD-10-CM | POA: Diagnosis not present

## 2014-07-27 DIAGNOSIS — D631 Anemia in chronic kidney disease: Secondary | ICD-10-CM | POA: Diagnosis not present

## 2014-07-27 DIAGNOSIS — N2581 Secondary hyperparathyroidism of renal origin: Secondary | ICD-10-CM | POA: Diagnosis not present

## 2014-07-27 DIAGNOSIS — N186 End stage renal disease: Secondary | ICD-10-CM | POA: Diagnosis not present

## 2014-07-28 DIAGNOSIS — N186 End stage renal disease: Secondary | ICD-10-CM | POA: Diagnosis not present

## 2014-07-28 DIAGNOSIS — R0600 Dyspnea, unspecified: Secondary | ICD-10-CM | POA: Diagnosis not present

## 2014-07-29 DIAGNOSIS — Z992 Dependence on renal dialysis: Secondary | ICD-10-CM | POA: Diagnosis not present

## 2014-07-29 DIAGNOSIS — N186 End stage renal disease: Secondary | ICD-10-CM | POA: Diagnosis not present

## 2014-07-29 DIAGNOSIS — D509 Iron deficiency anemia, unspecified: Secondary | ICD-10-CM | POA: Diagnosis not present

## 2014-07-29 DIAGNOSIS — D631 Anemia in chronic kidney disease: Secondary | ICD-10-CM | POA: Diagnosis not present

## 2014-07-29 DIAGNOSIS — N2581 Secondary hyperparathyroidism of renal origin: Secondary | ICD-10-CM | POA: Diagnosis not present

## 2014-08-01 DIAGNOSIS — N2581 Secondary hyperparathyroidism of renal origin: Secondary | ICD-10-CM | POA: Diagnosis not present

## 2014-08-01 DIAGNOSIS — D509 Iron deficiency anemia, unspecified: Secondary | ICD-10-CM | POA: Diagnosis not present

## 2014-08-01 DIAGNOSIS — D631 Anemia in chronic kidney disease: Secondary | ICD-10-CM | POA: Diagnosis not present

## 2014-08-01 DIAGNOSIS — N186 End stage renal disease: Secondary | ICD-10-CM | POA: Diagnosis not present

## 2014-08-01 DIAGNOSIS — Z992 Dependence on renal dialysis: Secondary | ICD-10-CM | POA: Diagnosis not present

## 2014-08-03 DIAGNOSIS — D631 Anemia in chronic kidney disease: Secondary | ICD-10-CM | POA: Diagnosis not present

## 2014-08-03 DIAGNOSIS — D509 Iron deficiency anemia, unspecified: Secondary | ICD-10-CM | POA: Diagnosis not present

## 2014-08-03 DIAGNOSIS — N2581 Secondary hyperparathyroidism of renal origin: Secondary | ICD-10-CM | POA: Diagnosis not present

## 2014-08-03 DIAGNOSIS — Z992 Dependence on renal dialysis: Secondary | ICD-10-CM | POA: Diagnosis not present

## 2014-08-03 DIAGNOSIS — N186 End stage renal disease: Secondary | ICD-10-CM | POA: Diagnosis not present

## 2014-08-05 DIAGNOSIS — N186 End stage renal disease: Secondary | ICD-10-CM | POA: Diagnosis not present

## 2014-08-05 DIAGNOSIS — N2581 Secondary hyperparathyroidism of renal origin: Secondary | ICD-10-CM | POA: Diagnosis not present

## 2014-08-05 DIAGNOSIS — D631 Anemia in chronic kidney disease: Secondary | ICD-10-CM | POA: Diagnosis not present

## 2014-08-05 DIAGNOSIS — D509 Iron deficiency anemia, unspecified: Secondary | ICD-10-CM | POA: Diagnosis not present

## 2014-08-05 DIAGNOSIS — Z992 Dependence on renal dialysis: Secondary | ICD-10-CM | POA: Diagnosis not present

## 2014-08-08 DIAGNOSIS — D509 Iron deficiency anemia, unspecified: Secondary | ICD-10-CM | POA: Diagnosis not present

## 2014-08-08 DIAGNOSIS — Z992 Dependence on renal dialysis: Secondary | ICD-10-CM | POA: Diagnosis not present

## 2014-08-08 DIAGNOSIS — D631 Anemia in chronic kidney disease: Secondary | ICD-10-CM | POA: Diagnosis not present

## 2014-08-08 DIAGNOSIS — N186 End stage renal disease: Secondary | ICD-10-CM | POA: Diagnosis not present

## 2014-08-08 DIAGNOSIS — N2581 Secondary hyperparathyroidism of renal origin: Secondary | ICD-10-CM | POA: Diagnosis not present

## 2014-08-10 DIAGNOSIS — N186 End stage renal disease: Secondary | ICD-10-CM | POA: Diagnosis not present

## 2014-08-10 DIAGNOSIS — Z992 Dependence on renal dialysis: Secondary | ICD-10-CM | POA: Diagnosis not present

## 2014-08-10 DIAGNOSIS — N2581 Secondary hyperparathyroidism of renal origin: Secondary | ICD-10-CM | POA: Diagnosis not present

## 2014-08-10 DIAGNOSIS — D631 Anemia in chronic kidney disease: Secondary | ICD-10-CM | POA: Diagnosis not present

## 2014-08-10 DIAGNOSIS — D509 Iron deficiency anemia, unspecified: Secondary | ICD-10-CM | POA: Diagnosis not present

## 2014-08-12 DIAGNOSIS — N2581 Secondary hyperparathyroidism of renal origin: Secondary | ICD-10-CM | POA: Diagnosis not present

## 2014-08-12 DIAGNOSIS — Z992 Dependence on renal dialysis: Secondary | ICD-10-CM | POA: Diagnosis not present

## 2014-08-12 DIAGNOSIS — D509 Iron deficiency anemia, unspecified: Secondary | ICD-10-CM | POA: Diagnosis not present

## 2014-08-12 DIAGNOSIS — N186 End stage renal disease: Secondary | ICD-10-CM | POA: Diagnosis not present

## 2014-08-12 DIAGNOSIS — D631 Anemia in chronic kidney disease: Secondary | ICD-10-CM | POA: Diagnosis not present

## 2014-08-15 DIAGNOSIS — D631 Anemia in chronic kidney disease: Secondary | ICD-10-CM | POA: Diagnosis not present

## 2014-08-15 DIAGNOSIS — D509 Iron deficiency anemia, unspecified: Secondary | ICD-10-CM | POA: Diagnosis not present

## 2014-08-15 DIAGNOSIS — Z992 Dependence on renal dialysis: Secondary | ICD-10-CM | POA: Diagnosis not present

## 2014-08-15 DIAGNOSIS — N186 End stage renal disease: Secondary | ICD-10-CM | POA: Diagnosis not present

## 2014-08-15 DIAGNOSIS — N2581 Secondary hyperparathyroidism of renal origin: Secondary | ICD-10-CM | POA: Diagnosis not present

## 2014-08-17 DIAGNOSIS — D509 Iron deficiency anemia, unspecified: Secondary | ICD-10-CM | POA: Diagnosis not present

## 2014-08-17 DIAGNOSIS — Z992 Dependence on renal dialysis: Secondary | ICD-10-CM | POA: Diagnosis not present

## 2014-08-17 DIAGNOSIS — D631 Anemia in chronic kidney disease: Secondary | ICD-10-CM | POA: Diagnosis not present

## 2014-08-17 DIAGNOSIS — N2581 Secondary hyperparathyroidism of renal origin: Secondary | ICD-10-CM | POA: Diagnosis not present

## 2014-08-17 DIAGNOSIS — N186 End stage renal disease: Secondary | ICD-10-CM | POA: Diagnosis not present

## 2014-08-19 DIAGNOSIS — D509 Iron deficiency anemia, unspecified: Secondary | ICD-10-CM | POA: Diagnosis not present

## 2014-08-19 DIAGNOSIS — D631 Anemia in chronic kidney disease: Secondary | ICD-10-CM | POA: Diagnosis not present

## 2014-08-19 DIAGNOSIS — Z992 Dependence on renal dialysis: Secondary | ICD-10-CM | POA: Diagnosis not present

## 2014-08-19 DIAGNOSIS — N2581 Secondary hyperparathyroidism of renal origin: Secondary | ICD-10-CM | POA: Diagnosis not present

## 2014-08-19 DIAGNOSIS — N186 End stage renal disease: Secondary | ICD-10-CM | POA: Diagnosis not present

## 2014-08-22 DIAGNOSIS — Z992 Dependence on renal dialysis: Secondary | ICD-10-CM | POA: Diagnosis not present

## 2014-08-22 DIAGNOSIS — N186 End stage renal disease: Secondary | ICD-10-CM | POA: Diagnosis not present

## 2014-08-22 DIAGNOSIS — D631 Anemia in chronic kidney disease: Secondary | ICD-10-CM | POA: Diagnosis not present

## 2014-08-22 DIAGNOSIS — D509 Iron deficiency anemia, unspecified: Secondary | ICD-10-CM | POA: Diagnosis not present

## 2014-08-22 DIAGNOSIS — N2581 Secondary hyperparathyroidism of renal origin: Secondary | ICD-10-CM | POA: Diagnosis not present

## 2014-08-23 DIAGNOSIS — I517 Cardiomegaly: Secondary | ICD-10-CM | POA: Diagnosis not present

## 2014-08-23 DIAGNOSIS — N281 Cyst of kidney, acquired: Secondary | ICD-10-CM | POA: Diagnosis not present

## 2014-08-24 DIAGNOSIS — Z992 Dependence on renal dialysis: Secondary | ICD-10-CM | POA: Diagnosis not present

## 2014-08-24 DIAGNOSIS — N2581 Secondary hyperparathyroidism of renal origin: Secondary | ICD-10-CM | POA: Diagnosis not present

## 2014-08-24 DIAGNOSIS — N186 End stage renal disease: Secondary | ICD-10-CM | POA: Diagnosis not present

## 2014-08-24 DIAGNOSIS — D509 Iron deficiency anemia, unspecified: Secondary | ICD-10-CM | POA: Diagnosis not present

## 2014-08-24 DIAGNOSIS — D631 Anemia in chronic kidney disease: Secondary | ICD-10-CM | POA: Diagnosis not present

## 2014-08-26 DIAGNOSIS — N186 End stage renal disease: Secondary | ICD-10-CM | POA: Diagnosis not present

## 2014-08-26 DIAGNOSIS — D509 Iron deficiency anemia, unspecified: Secondary | ICD-10-CM | POA: Diagnosis not present

## 2014-08-26 DIAGNOSIS — Z992 Dependence on renal dialysis: Secondary | ICD-10-CM | POA: Diagnosis not present

## 2014-08-26 DIAGNOSIS — D631 Anemia in chronic kidney disease: Secondary | ICD-10-CM | POA: Diagnosis not present

## 2014-08-26 DIAGNOSIS — N2581 Secondary hyperparathyroidism of renal origin: Secondary | ICD-10-CM | POA: Diagnosis not present

## 2014-08-29 DIAGNOSIS — N2581 Secondary hyperparathyroidism of renal origin: Secondary | ICD-10-CM | POA: Diagnosis not present

## 2014-08-29 DIAGNOSIS — D509 Iron deficiency anemia, unspecified: Secondary | ICD-10-CM | POA: Diagnosis not present

## 2014-08-29 DIAGNOSIS — N186 End stage renal disease: Secondary | ICD-10-CM | POA: Diagnosis not present

## 2014-08-29 DIAGNOSIS — Z992 Dependence on renal dialysis: Secondary | ICD-10-CM | POA: Diagnosis not present

## 2014-08-29 DIAGNOSIS — D631 Anemia in chronic kidney disease: Secondary | ICD-10-CM | POA: Diagnosis not present

## 2014-08-31 DIAGNOSIS — D631 Anemia in chronic kidney disease: Secondary | ICD-10-CM | POA: Diagnosis not present

## 2014-08-31 DIAGNOSIS — Z992 Dependence on renal dialysis: Secondary | ICD-10-CM | POA: Diagnosis not present

## 2014-08-31 DIAGNOSIS — D509 Iron deficiency anemia, unspecified: Secondary | ICD-10-CM | POA: Diagnosis not present

## 2014-08-31 DIAGNOSIS — N2581 Secondary hyperparathyroidism of renal origin: Secondary | ICD-10-CM | POA: Diagnosis not present

## 2014-08-31 DIAGNOSIS — N186 End stage renal disease: Secondary | ICD-10-CM | POA: Diagnosis not present

## 2014-09-02 DIAGNOSIS — N186 End stage renal disease: Secondary | ICD-10-CM | POA: Diagnosis not present

## 2014-09-02 DIAGNOSIS — N2581 Secondary hyperparathyroidism of renal origin: Secondary | ICD-10-CM | POA: Diagnosis not present

## 2014-09-02 DIAGNOSIS — D631 Anemia in chronic kidney disease: Secondary | ICD-10-CM | POA: Diagnosis not present

## 2014-09-02 DIAGNOSIS — Z992 Dependence on renal dialysis: Secondary | ICD-10-CM | POA: Diagnosis not present

## 2014-09-02 DIAGNOSIS — D509 Iron deficiency anemia, unspecified: Secondary | ICD-10-CM | POA: Diagnosis not present

## 2014-09-05 DIAGNOSIS — E119 Type 2 diabetes mellitus without complications: Secondary | ICD-10-CM | POA: Diagnosis not present

## 2014-09-05 DIAGNOSIS — Z992 Dependence on renal dialysis: Secondary | ICD-10-CM | POA: Diagnosis not present

## 2014-09-05 DIAGNOSIS — N186 End stage renal disease: Secondary | ICD-10-CM | POA: Diagnosis not present

## 2014-09-05 DIAGNOSIS — N2581 Secondary hyperparathyroidism of renal origin: Secondary | ICD-10-CM | POA: Diagnosis not present

## 2014-09-05 DIAGNOSIS — D631 Anemia in chronic kidney disease: Secondary | ICD-10-CM | POA: Diagnosis not present

## 2014-09-05 DIAGNOSIS — D509 Iron deficiency anemia, unspecified: Secondary | ICD-10-CM | POA: Diagnosis not present

## 2014-09-07 DIAGNOSIS — N2581 Secondary hyperparathyroidism of renal origin: Secondary | ICD-10-CM | POA: Diagnosis not present

## 2014-09-07 DIAGNOSIS — D509 Iron deficiency anemia, unspecified: Secondary | ICD-10-CM | POA: Diagnosis not present

## 2014-09-07 DIAGNOSIS — Z992 Dependence on renal dialysis: Secondary | ICD-10-CM | POA: Diagnosis not present

## 2014-09-07 DIAGNOSIS — N186 End stage renal disease: Secondary | ICD-10-CM | POA: Diagnosis not present

## 2014-09-07 DIAGNOSIS — D631 Anemia in chronic kidney disease: Secondary | ICD-10-CM | POA: Diagnosis not present

## 2014-09-09 DIAGNOSIS — D631 Anemia in chronic kidney disease: Secondary | ICD-10-CM | POA: Diagnosis not present

## 2014-09-09 DIAGNOSIS — D509 Iron deficiency anemia, unspecified: Secondary | ICD-10-CM | POA: Diagnosis not present

## 2014-09-09 DIAGNOSIS — Z992 Dependence on renal dialysis: Secondary | ICD-10-CM | POA: Diagnosis not present

## 2014-09-09 DIAGNOSIS — N186 End stage renal disease: Secondary | ICD-10-CM | POA: Diagnosis not present

## 2014-09-09 DIAGNOSIS — N2581 Secondary hyperparathyroidism of renal origin: Secondary | ICD-10-CM | POA: Diagnosis not present

## 2014-09-12 DIAGNOSIS — D631 Anemia in chronic kidney disease: Secondary | ICD-10-CM | POA: Diagnosis not present

## 2014-09-12 DIAGNOSIS — D509 Iron deficiency anemia, unspecified: Secondary | ICD-10-CM | POA: Diagnosis not present

## 2014-09-12 DIAGNOSIS — N2581 Secondary hyperparathyroidism of renal origin: Secondary | ICD-10-CM | POA: Diagnosis not present

## 2014-09-12 DIAGNOSIS — N186 End stage renal disease: Secondary | ICD-10-CM | POA: Diagnosis not present

## 2014-09-12 DIAGNOSIS — Z992 Dependence on renal dialysis: Secondary | ICD-10-CM | POA: Diagnosis not present

## 2014-09-14 DIAGNOSIS — D509 Iron deficiency anemia, unspecified: Secondary | ICD-10-CM | POA: Diagnosis not present

## 2014-09-14 DIAGNOSIS — N2581 Secondary hyperparathyroidism of renal origin: Secondary | ICD-10-CM | POA: Diagnosis not present

## 2014-09-14 DIAGNOSIS — Z992 Dependence on renal dialysis: Secondary | ICD-10-CM | POA: Diagnosis not present

## 2014-09-14 DIAGNOSIS — N186 End stage renal disease: Secondary | ICD-10-CM | POA: Diagnosis not present

## 2014-09-14 DIAGNOSIS — D631 Anemia in chronic kidney disease: Secondary | ICD-10-CM | POA: Diagnosis not present

## 2014-09-16 DIAGNOSIS — N2581 Secondary hyperparathyroidism of renal origin: Secondary | ICD-10-CM | POA: Diagnosis not present

## 2014-09-16 DIAGNOSIS — N186 End stage renal disease: Secondary | ICD-10-CM | POA: Diagnosis not present

## 2014-09-16 DIAGNOSIS — D509 Iron deficiency anemia, unspecified: Secondary | ICD-10-CM | POA: Diagnosis not present

## 2014-09-16 DIAGNOSIS — Z992 Dependence on renal dialysis: Secondary | ICD-10-CM | POA: Diagnosis not present

## 2014-09-16 DIAGNOSIS — D631 Anemia in chronic kidney disease: Secondary | ICD-10-CM | POA: Diagnosis not present

## 2014-09-16 NOTE — Op Note (Signed)
PATIENT NAME:  Raymond [name], Raymond [name] MR#:  [id number] DATE OF BIRTH:  01-02-1961  DATE OF PROCEDURE:  09/16/2013  PREOPERATIVE DIAGNOSIS:  Stage V renal insufficiency.   POSTOPERATIVE DIAGNOSIS:  Stage V renal insufficiency.  PROCEDURE PERFORMED: Laparoscopic-assisted insertion of peritoneal dialysis catheter.   SURGEON:  Dr. Delana Meyer.  FIRST ASSISTANT: Ms. Gillie Manners.   ANESTHESIA: General by endotracheal intubation.   FLUIDS: Per anesthesia record.   ESTIMATED BLOOD LOSS: Minimal.   SPECIMEN: None.   INDICATIONS FOR PROCEDURE: Raymond [name] is a 54 year old gentleman who will be initiated on hemodialysis and has elected to undergo peritoneal dialysis. He is, therefore, undergoing placement of a peritoneal dialysis catheter. Risks and benefits were reviewed. All questions are answered. The patient agrees to proceed.   DESCRIPTION OF PROCEDURE: The patient is taken to the operating room and placed in the supine position. After adequate general anesthesia is induced and appropriate invasive monitors are placed, he is positioned supine and prepped from the nipple line down to the groins. He is then draped in a sterile fashion. Appropriate timeout is called.   A small vertical incision is made supraumbilically in the midline, carried down to the fascia. A second oblique incision is made in the left lower quadrant centered on lateral margin of the rectus, again carried down through the soft tissues using Bovie cautery to expose the fascia.   A bone hook is then used to elevate the abdominal wall in the midline and a Veress needle is introduced. Saline test is positive, and low-flow CO2 insufflation is performed; subsequently, high-flow insufflation to 15 mmHg pressure. With the bone hook grasping the abdominal fascia, a 5 mm trocar is introduced and subsequently the camera is passed into the peritoneal cavity. Inspection demonstrates the visualized viscera are normal in appearance. There are no  significant adhesions or scarring. There are no inflammatory changes.   Needle is then introduced under direct visualization with the camera, and a J-wire advanced. Dilator peel-away sheath is then advanced over the wire and the catheter, which been placed on the stylet, is advanced through the peel-away sheath. The peel-away sheath is removed and the catheter is negotiated down into the peritoneal cavity deep into the pelvis. The cuff is then placed below the anterior portion of the fascia of the rectus and secured with 0 Vicryl in a pursestring fashion. Gas is then expelled under direct visualization and the trocar is removed from the midline. Exit site is selected. It is purposefully placed laterally at the patient's request and subsequently the catheter is pulled through the subcutaneous tissues and out the exit site. The hub assembly is then connected without difficulty. Sterile saline 240 mL is advanced into the catheter and subsequently 220 mL is promptly returned with good flow. Betadine impregnated cap is then placed.   Both incisions are then closed in layers using 3-0 Vicryl for the subcutaneous tissues and 4-0 Monocryl for the skin. Dermabond is applied. Sterile dressing is placed around the catheter. The patient tolerated the procedure well and there were no immediate complications. Sponge and needle counts are correct. He was taken to the recovery area in excellent condition.   ____________________________ Katha Cabal, MD ggs:ce D: 09/16/2013 12:27:00 ET T: 09/16/2013 13:09:57 ET JOB#: [id number]  cc: Katha Cabal, MD, <Dictator> Dr. Tammi Klippel MD ELECTRONICALLY SIGNED 09/27/2013 10:04

## 2014-09-16 NOTE — Op Note (Signed)
PATIENT NAME:  Raymond [name], Raymond [name] MR#:  [id number] DATE OF BIRTH:  [birth year]  DATE OF PROCEDURE:  03/17/2014  PREOPERATIVE DIAGNOSES:  1. Complication of dialysis device.  2. Nonfunctioning peritoneal dialysis catheter.  3. End-stage renal disease requiring hemodialysis.  4. 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  5. Hypertension.  POSTOPERATIVE DIAGNOSES: 1. Complication of dialysis device.  2. Nonfunctioning peritoneal dialysis catheter.  3. End-stage renal disease requiring hemodialysis.  4. 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  5. Hypertension.  PROCEDURES PERFORMED: 1. Removal of peritoneal dialysis catheter.  2. Contrast injection of left brachiocephalic fistula.  3. Percutaneous transluminal angioplasty and stent placement, cephalic confluence left arm, for relief of venous outflow obstruction.   SURGEON: Katha Cabal, MD.  ANESTHESIA: General by LMA.   FLUIDS: Per anesthesia record.   ESTIMATED BLOOD LOSS: Minimal.   CONTRAST USED: Isovue 20 mL.   FLUOROSCOPY TIME: Approximately 1 minute.   INDICATIONS: Mr. Osegueda is a 54 year old gentleman who has been on peritoneal dialysis but has not done well. The catheter is no longer functioning and he is now going back to hemodialysis. On examination, his left brachiocephalic fistula is markedly pulsatile suggesting high-grade stricture, stenosis of the more proximal cephalic vein. This was confirmed, and he is now undergoing evaluation for correction of that.   DESCRIPTION OF PROCEDURE: The patient is taken to the Operating Room and placed in the supine position. After adequate general anesthesia is induced and appropriate invasive monitors placed, he is positioned supine and his abdomen is prepped and draped in a sterile fashion. Appropriate timeout is called.   Previous left lower quadrant incisional scar is then infiltrated with 0.25% Marcaine. A  10-blade scalpel is used to incise the skin, and subsequently the dissection is carried down to expose the peritoneal catheter. The cuff within the rectus sheath is then dissected free, and a pursestring suture of 0 Vicryl is placed around the cuff. The more proximal cuff is then dissected free as well. The catheter is transected and removed in 2 pieces. Pursestring suture is secured to close the fascia. The wound is then irrigated and closed in layers using 3-0 Vicryl followed by 4-0 Monocryl subcuticular and Dermabond.   Attention is then turned to the left arm fistula. Left arm is extended palm upward. It is then prepped and draped in a sterile fashion. Appropriate timeout is called.   The 1% lidocaine is infiltrated in the soft tissues and a microneedle is inserted into the fistula near the anastomosis. Antegrade orientation is selected, Microwire followed by micro sheath, J-wire followed by a 7 French sheath.   Hand injection of contrast is then used to create images of the fistula. Confirmation of a greater than 90% stenosis at the cephalic confluence is made. Then 3000 units of heparin is given and, using a Kumpe and a Glidewire, the Kumpe wire combination is negotiated into the central venous system. The 18 wire is then selected. Kumpe is removed. An 8 x 50 Viabahn stent is then deployed over the stricture stenosis and post dilated with an 8 x 6 Dorado balloon. Inflation is for 30 seconds to 28 atmospheres. Follow-up imaging demonstrates complete resolution of the previous stenosis. Pursestring suture is placed around the sheath and the sheath is removed. There are no immediate complications.   INTERPRETATION: Imaging demonstrates that the fistula is patent. 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 Central venous portion is patent. At  the cephalic confluence, there is a greater than 90% stenosis. This is treated with a Viabahn stent and post dilatation to 8 mm with complete  resolution.   ____________________________ Katha Cabal, MD ggs:jh D: 03/17/2014 09:42:13 ET T: 03/17/2014 10:17:17 ET JOB#: [id number]  cc: Katha Cabal, MD, <Dictator> Katha Cabal MD ELECTRONICALLY SIGNED 03/29/2014 11:45

## 2014-09-19 DIAGNOSIS — D631 Anemia in chronic kidney disease: Secondary | ICD-10-CM | POA: Diagnosis not present

## 2014-09-19 DIAGNOSIS — D509 Iron deficiency anemia, unspecified: Secondary | ICD-10-CM | POA: Diagnosis not present

## 2014-09-19 DIAGNOSIS — N186 End stage renal disease: Secondary | ICD-10-CM | POA: Diagnosis not present

## 2014-09-19 DIAGNOSIS — N2581 Secondary hyperparathyroidism of renal origin: Secondary | ICD-10-CM | POA: Diagnosis not present

## 2014-09-19 DIAGNOSIS — Z992 Dependence on renal dialysis: Secondary | ICD-10-CM | POA: Diagnosis not present

## 2014-09-21 DIAGNOSIS — N186 End stage renal disease: Secondary | ICD-10-CM | POA: Diagnosis not present

## 2014-09-21 DIAGNOSIS — N2581 Secondary hyperparathyroidism of renal origin: Secondary | ICD-10-CM | POA: Diagnosis not present

## 2014-09-21 DIAGNOSIS — D509 Iron deficiency anemia, unspecified: Secondary | ICD-10-CM | POA: Diagnosis not present

## 2014-09-21 DIAGNOSIS — D631 Anemia in chronic kidney disease: Secondary | ICD-10-CM | POA: Diagnosis not present

## 2014-09-21 DIAGNOSIS — Z992 Dependence on renal dialysis: Secondary | ICD-10-CM | POA: Diagnosis not present

## 2014-09-23 DIAGNOSIS — N2581 Secondary hyperparathyroidism of renal origin: Secondary | ICD-10-CM | POA: Diagnosis not present

## 2014-09-23 DIAGNOSIS — D509 Iron deficiency anemia, unspecified: Secondary | ICD-10-CM | POA: Diagnosis not present

## 2014-09-23 DIAGNOSIS — N186 End stage renal disease: Secondary | ICD-10-CM | POA: Diagnosis not present

## 2014-09-23 DIAGNOSIS — D631 Anemia in chronic kidney disease: Secondary | ICD-10-CM | POA: Diagnosis not present

## 2014-09-23 DIAGNOSIS — Z992 Dependence on renal dialysis: Secondary | ICD-10-CM | POA: Diagnosis not present

## 2014-09-26 DIAGNOSIS — D509 Iron deficiency anemia, unspecified: Secondary | ICD-10-CM | POA: Diagnosis not present

## 2014-09-26 DIAGNOSIS — N2581 Secondary hyperparathyroidism of renal origin: Secondary | ICD-10-CM | POA: Diagnosis not present

## 2014-09-26 DIAGNOSIS — D631 Anemia in chronic kidney disease: Secondary | ICD-10-CM | POA: Diagnosis not present

## 2014-09-26 DIAGNOSIS — Z992 Dependence on renal dialysis: Secondary | ICD-10-CM | POA: Diagnosis not present

## 2014-09-26 DIAGNOSIS — N186 End stage renal disease: Secondary | ICD-10-CM | POA: Diagnosis not present

## 2014-09-28 DIAGNOSIS — N186 End stage renal disease: Secondary | ICD-10-CM | POA: Diagnosis not present

## 2014-09-28 DIAGNOSIS — D509 Iron deficiency anemia, unspecified: Secondary | ICD-10-CM | POA: Diagnosis not present

## 2014-09-28 DIAGNOSIS — Z992 Dependence on renal dialysis: Secondary | ICD-10-CM | POA: Diagnosis not present

## 2014-09-28 DIAGNOSIS — D631 Anemia in chronic kidney disease: Secondary | ICD-10-CM | POA: Diagnosis not present

## 2014-09-28 DIAGNOSIS — N2581 Secondary hyperparathyroidism of renal origin: Secondary | ICD-10-CM | POA: Diagnosis not present

## 2014-09-30 DIAGNOSIS — D631 Anemia in chronic kidney disease: Secondary | ICD-10-CM | POA: Diagnosis not present

## 2014-09-30 DIAGNOSIS — N2581 Secondary hyperparathyroidism of renal origin: Secondary | ICD-10-CM | POA: Diagnosis not present

## 2014-09-30 DIAGNOSIS — Z992 Dependence on renal dialysis: Secondary | ICD-10-CM | POA: Diagnosis not present

## 2014-09-30 DIAGNOSIS — D509 Iron deficiency anemia, unspecified: Secondary | ICD-10-CM | POA: Diagnosis not present

## 2014-09-30 DIAGNOSIS — N186 End stage renal disease: Secondary | ICD-10-CM | POA: Diagnosis not present

## 2014-10-03 DIAGNOSIS — Z992 Dependence on renal dialysis: Secondary | ICD-10-CM | POA: Diagnosis not present

## 2014-10-03 DIAGNOSIS — N186 End stage renal disease: Secondary | ICD-10-CM | POA: Diagnosis not present

## 2014-10-03 DIAGNOSIS — D509 Iron deficiency anemia, unspecified: Secondary | ICD-10-CM | POA: Diagnosis not present

## 2014-10-03 DIAGNOSIS — N2581 Secondary hyperparathyroidism of renal origin: Secondary | ICD-10-CM | POA: Diagnosis not present

## 2014-10-03 DIAGNOSIS — D631 Anemia in chronic kidney disease: Secondary | ICD-10-CM | POA: Diagnosis not present

## 2014-10-05 DIAGNOSIS — Z992 Dependence on renal dialysis: Secondary | ICD-10-CM | POA: Diagnosis not present

## 2014-10-05 DIAGNOSIS — N2581 Secondary hyperparathyroidism of renal origin: Secondary | ICD-10-CM | POA: Diagnosis not present

## 2014-10-05 DIAGNOSIS — D631 Anemia in chronic kidney disease: Secondary | ICD-10-CM | POA: Diagnosis not present

## 2014-10-05 DIAGNOSIS — N186 End stage renal disease: Secondary | ICD-10-CM | POA: Diagnosis not present

## 2014-10-05 DIAGNOSIS — D509 Iron deficiency anemia, unspecified: Secondary | ICD-10-CM | POA: Diagnosis not present

## 2014-10-07 DIAGNOSIS — N2581 Secondary hyperparathyroidism of renal origin: Secondary | ICD-10-CM | POA: Diagnosis not present

## 2014-10-07 DIAGNOSIS — N186 End stage renal disease: Secondary | ICD-10-CM | POA: Diagnosis not present

## 2014-10-07 DIAGNOSIS — D631 Anemia in chronic kidney disease: Secondary | ICD-10-CM | POA: Diagnosis not present

## 2014-10-07 DIAGNOSIS — Z992 Dependence on renal dialysis: Secondary | ICD-10-CM | POA: Diagnosis not present

## 2014-10-07 DIAGNOSIS — D509 Iron deficiency anemia, unspecified: Secondary | ICD-10-CM | POA: Diagnosis not present

## 2014-10-10 DIAGNOSIS — N186 End stage renal disease: Secondary | ICD-10-CM | POA: Diagnosis not present

## 2014-10-10 DIAGNOSIS — N2581 Secondary hyperparathyroidism of renal origin: Secondary | ICD-10-CM | POA: Diagnosis not present

## 2014-10-10 DIAGNOSIS — Z992 Dependence on renal dialysis: Secondary | ICD-10-CM | POA: Diagnosis not present

## 2014-10-10 DIAGNOSIS — D509 Iron deficiency anemia, unspecified: Secondary | ICD-10-CM | POA: Diagnosis not present

## 2014-10-10 DIAGNOSIS — D631 Anemia in chronic kidney disease: Secondary | ICD-10-CM | POA: Diagnosis not present

## 2014-10-12 DIAGNOSIS — Z992 Dependence on renal dialysis: Secondary | ICD-10-CM | POA: Diagnosis not present

## 2014-10-12 DIAGNOSIS — N2581 Secondary hyperparathyroidism of renal origin: Secondary | ICD-10-CM | POA: Diagnosis not present

## 2014-10-12 DIAGNOSIS — N186 End stage renal disease: Secondary | ICD-10-CM | POA: Diagnosis not present

## 2014-10-12 DIAGNOSIS — D509 Iron deficiency anemia, unspecified: Secondary | ICD-10-CM | POA: Diagnosis not present

## 2014-10-12 DIAGNOSIS — D631 Anemia in chronic kidney disease: Secondary | ICD-10-CM | POA: Diagnosis not present

## 2014-10-14 DIAGNOSIS — Z992 Dependence on renal dialysis: Secondary | ICD-10-CM | POA: Diagnosis not present

## 2014-10-14 DIAGNOSIS — D631 Anemia in chronic kidney disease: Secondary | ICD-10-CM | POA: Diagnosis not present

## 2014-10-14 DIAGNOSIS — N2581 Secondary hyperparathyroidism of renal origin: Secondary | ICD-10-CM | POA: Diagnosis not present

## 2014-10-14 DIAGNOSIS — N186 End stage renal disease: Secondary | ICD-10-CM | POA: Diagnosis not present

## 2014-10-14 DIAGNOSIS — D509 Iron deficiency anemia, unspecified: Secondary | ICD-10-CM | POA: Diagnosis not present

## 2014-10-17 DIAGNOSIS — Z992 Dependence on renal dialysis: Secondary | ICD-10-CM | POA: Diagnosis not present

## 2014-10-17 DIAGNOSIS — N2581 Secondary hyperparathyroidism of renal origin: Secondary | ICD-10-CM | POA: Diagnosis not present

## 2014-10-17 DIAGNOSIS — N186 End stage renal disease: Secondary | ICD-10-CM | POA: Diagnosis not present

## 2014-10-17 DIAGNOSIS — D631 Anemia in chronic kidney disease: Secondary | ICD-10-CM | POA: Diagnosis not present

## 2014-10-17 DIAGNOSIS — D509 Iron deficiency anemia, unspecified: Secondary | ICD-10-CM | POA: Diagnosis not present

## 2014-10-19 DIAGNOSIS — D509 Iron deficiency anemia, unspecified: Secondary | ICD-10-CM | POA: Diagnosis not present

## 2014-10-19 DIAGNOSIS — N2581 Secondary hyperparathyroidism of renal origin: Secondary | ICD-10-CM | POA: Diagnosis not present

## 2014-10-19 DIAGNOSIS — N186 End stage renal disease: Secondary | ICD-10-CM | POA: Diagnosis not present

## 2014-10-19 DIAGNOSIS — Z992 Dependence on renal dialysis: Secondary | ICD-10-CM | POA: Diagnosis not present

## 2014-10-19 DIAGNOSIS — D631 Anemia in chronic kidney disease: Secondary | ICD-10-CM | POA: Diagnosis not present

## 2014-10-21 DIAGNOSIS — Z992 Dependence on renal dialysis: Secondary | ICD-10-CM | POA: Diagnosis not present

## 2014-10-21 DIAGNOSIS — N2581 Secondary hyperparathyroidism of renal origin: Secondary | ICD-10-CM | POA: Diagnosis not present

## 2014-10-21 DIAGNOSIS — D631 Anemia in chronic kidney disease: Secondary | ICD-10-CM | POA: Diagnosis not present

## 2014-10-21 DIAGNOSIS — D509 Iron deficiency anemia, unspecified: Secondary | ICD-10-CM | POA: Diagnosis not present

## 2014-10-21 DIAGNOSIS — N186 End stage renal disease: Secondary | ICD-10-CM | POA: Diagnosis not present

## 2014-10-24 DIAGNOSIS — N186 End stage renal disease: Secondary | ICD-10-CM | POA: Diagnosis not present

## 2014-10-24 DIAGNOSIS — Z992 Dependence on renal dialysis: Secondary | ICD-10-CM | POA: Diagnosis not present

## 2014-10-24 DIAGNOSIS — D509 Iron deficiency anemia, unspecified: Secondary | ICD-10-CM | POA: Diagnosis not present

## 2014-10-24 DIAGNOSIS — N2581 Secondary hyperparathyroidism of renal origin: Secondary | ICD-10-CM | POA: Diagnosis not present

## 2014-10-24 DIAGNOSIS — D631 Anemia in chronic kidney disease: Secondary | ICD-10-CM | POA: Diagnosis not present

## 2014-10-26 DIAGNOSIS — N2581 Secondary hyperparathyroidism of renal origin: Secondary | ICD-10-CM | POA: Diagnosis not present

## 2014-10-26 DIAGNOSIS — D509 Iron deficiency anemia, unspecified: Secondary | ICD-10-CM | POA: Diagnosis not present

## 2014-10-26 DIAGNOSIS — Z992 Dependence on renal dialysis: Secondary | ICD-10-CM | POA: Diagnosis not present

## 2014-10-26 DIAGNOSIS — N186 End stage renal disease: Secondary | ICD-10-CM | POA: Diagnosis not present

## 2014-10-26 DIAGNOSIS — D631 Anemia in chronic kidney disease: Secondary | ICD-10-CM | POA: Diagnosis not present

## 2014-10-28 DIAGNOSIS — N2581 Secondary hyperparathyroidism of renal origin: Secondary | ICD-10-CM | POA: Diagnosis not present

## 2014-10-28 DIAGNOSIS — D631 Anemia in chronic kidney disease: Secondary | ICD-10-CM | POA: Diagnosis not present

## 2014-10-28 DIAGNOSIS — N186 End stage renal disease: Secondary | ICD-10-CM | POA: Diagnosis not present

## 2014-10-28 DIAGNOSIS — D509 Iron deficiency anemia, unspecified: Secondary | ICD-10-CM | POA: Diagnosis not present

## 2014-10-28 DIAGNOSIS — Z992 Dependence on renal dialysis: Secondary | ICD-10-CM | POA: Diagnosis not present

## 2014-10-31 DIAGNOSIS — Z992 Dependence on renal dialysis: Secondary | ICD-10-CM | POA: Diagnosis not present

## 2014-10-31 DIAGNOSIS — N186 End stage renal disease: Secondary | ICD-10-CM | POA: Diagnosis not present

## 2014-10-31 DIAGNOSIS — D631 Anemia in chronic kidney disease: Secondary | ICD-10-CM | POA: Diagnosis not present

## 2014-10-31 DIAGNOSIS — N2581 Secondary hyperparathyroidism of renal origin: Secondary | ICD-10-CM | POA: Diagnosis not present

## 2014-10-31 DIAGNOSIS — D509 Iron deficiency anemia, unspecified: Secondary | ICD-10-CM | POA: Diagnosis not present

## 2014-11-01 DIAGNOSIS — E11351 Type 2 diabetes mellitus with proliferative diabetic retinopathy with macular edema: Secondary | ICD-10-CM | POA: Diagnosis not present

## 2014-11-01 DIAGNOSIS — H25013 Cortical age-related cataract, bilateral: Secondary | ICD-10-CM | POA: Diagnosis not present

## 2014-11-01 DIAGNOSIS — H04123 Dry eye syndrome of bilateral lacrimal glands: Secondary | ICD-10-CM | POA: Diagnosis not present

## 2014-11-02 DIAGNOSIS — Z992 Dependence on renal dialysis: Secondary | ICD-10-CM | POA: Diagnosis not present

## 2014-11-02 DIAGNOSIS — D509 Iron deficiency anemia, unspecified: Secondary | ICD-10-CM | POA: Diagnosis not present

## 2014-11-02 DIAGNOSIS — N186 End stage renal disease: Secondary | ICD-10-CM | POA: Diagnosis not present

## 2014-11-02 DIAGNOSIS — N2581 Secondary hyperparathyroidism of renal origin: Secondary | ICD-10-CM | POA: Diagnosis not present

## 2014-11-02 DIAGNOSIS — D631 Anemia in chronic kidney disease: Secondary | ICD-10-CM | POA: Diagnosis not present

## 2014-11-04 DIAGNOSIS — N2581 Secondary hyperparathyroidism of renal origin: Secondary | ICD-10-CM | POA: Diagnosis not present

## 2014-11-04 DIAGNOSIS — D631 Anemia in chronic kidney disease: Secondary | ICD-10-CM | POA: Diagnosis not present

## 2014-11-04 DIAGNOSIS — Z992 Dependence on renal dialysis: Secondary | ICD-10-CM | POA: Diagnosis not present

## 2014-11-04 DIAGNOSIS — N186 End stage renal disease: Secondary | ICD-10-CM | POA: Diagnosis not present

## 2014-11-04 DIAGNOSIS — D509 Iron deficiency anemia, unspecified: Secondary | ICD-10-CM | POA: Diagnosis not present

## 2014-11-07 DIAGNOSIS — D509 Iron deficiency anemia, unspecified: Secondary | ICD-10-CM | POA: Diagnosis not present

## 2014-11-07 DIAGNOSIS — Z992 Dependence on renal dialysis: Secondary | ICD-10-CM | POA: Diagnosis not present

## 2014-11-07 DIAGNOSIS — D631 Anemia in chronic kidney disease: Secondary | ICD-10-CM | POA: Diagnosis not present

## 2014-11-07 DIAGNOSIS — N2581 Secondary hyperparathyroidism of renal origin: Secondary | ICD-10-CM | POA: Diagnosis not present

## 2014-11-07 DIAGNOSIS — N186 End stage renal disease: Secondary | ICD-10-CM | POA: Diagnosis not present

## 2014-11-09 DIAGNOSIS — Z992 Dependence on renal dialysis: Secondary | ICD-10-CM | POA: Diagnosis not present

## 2014-11-09 DIAGNOSIS — D509 Iron deficiency anemia, unspecified: Secondary | ICD-10-CM | POA: Diagnosis not present

## 2014-11-09 DIAGNOSIS — N2581 Secondary hyperparathyroidism of renal origin: Secondary | ICD-10-CM | POA: Diagnosis not present

## 2014-11-09 DIAGNOSIS — D631 Anemia in chronic kidney disease: Secondary | ICD-10-CM | POA: Diagnosis not present

## 2014-11-09 DIAGNOSIS — N186 End stage renal disease: Secondary | ICD-10-CM | POA: Diagnosis not present

## 2014-11-11 DIAGNOSIS — N2581 Secondary hyperparathyroidism of renal origin: Secondary | ICD-10-CM | POA: Diagnosis not present

## 2014-11-11 DIAGNOSIS — Z992 Dependence on renal dialysis: Secondary | ICD-10-CM | POA: Diagnosis not present

## 2014-11-11 DIAGNOSIS — N186 End stage renal disease: Secondary | ICD-10-CM | POA: Diagnosis not present

## 2014-11-11 DIAGNOSIS — D631 Anemia in chronic kidney disease: Secondary | ICD-10-CM | POA: Diagnosis not present

## 2014-11-11 DIAGNOSIS — D509 Iron deficiency anemia, unspecified: Secondary | ICD-10-CM | POA: Diagnosis not present

## 2014-11-14 DIAGNOSIS — D509 Iron deficiency anemia, unspecified: Secondary | ICD-10-CM | POA: Diagnosis not present

## 2014-11-14 DIAGNOSIS — Z992 Dependence on renal dialysis: Secondary | ICD-10-CM | POA: Diagnosis not present

## 2014-11-14 DIAGNOSIS — N2581 Secondary hyperparathyroidism of renal origin: Secondary | ICD-10-CM | POA: Diagnosis not present

## 2014-11-14 DIAGNOSIS — N186 End stage renal disease: Secondary | ICD-10-CM | POA: Diagnosis not present

## 2014-11-14 DIAGNOSIS — D631 Anemia in chronic kidney disease: Secondary | ICD-10-CM | POA: Diagnosis not present

## 2014-11-15 ENCOUNTER — Other Ambulatory Visit: Payer: Self-pay

## 2014-11-15 ENCOUNTER — Encounter: Payer: Self-pay | Admitting: Nurse Practitioner

## 2014-11-15 ENCOUNTER — Ambulatory Visit (INDEPENDENT_AMBULATORY_CARE_PROVIDER_SITE_OTHER): Payer: Medicare Other | Admitting: Nurse Practitioner

## 2014-11-15 ENCOUNTER — Encounter: Payer: BLUE CROSS/BLUE SHIELD | Admitting: Nurse Practitioner

## 2014-11-15 DIAGNOSIS — Z1211 Encounter for screening for malignant neoplasm of colon: Secondary | ICD-10-CM

## 2014-11-15 DIAGNOSIS — K219 Gastro-esophageal reflux disease without esophagitis: Secondary | ICD-10-CM | POA: Diagnosis not present

## 2014-11-15 MED ORDER — PEG-KCL-NACL-NASULF-NA ASC-C 100 G PO SOLR: 1.0000 | ORAL | Status: DC

## 2014-11-15 MED ORDER — OMEPRAZOLE 20 MG PO CPDR: 20.0000 mg | DELAYED_RELEASE_CAPSULE | Freq: Every day | ORAL | Status: DC

## 2014-11-15 NOTE — Progress Notes (Signed)
ERROR

## 2014-11-15 NOTE — Patient Instructions (Signed)
1. Called in a prescription for Prilosec to your pharmacy. Take this every day 30 minutes before your first meal the day. 2. We will schedule your procedure for you. 3. Other recommendations to be based on the results your procedure. 4. Return in 3 months tentatively depending on the status of your kidney transplant situation.

## 2014-11-15 NOTE — Progress Notes (Signed)
Primary Care Physician:  Rosita Fire, MD Primary Gastroenterologist:  Dr. Oneida Alar  No chief complaint on file.   HPI:   54 year old male presents on referral from PCP for screening colonoscopy prior to kidney transplant. He is at the top of the treatment kidney transplant list and has been notified of an available organ once, however he needs a colonoscopy before he is eligible for transplant. Today he states he does have GERD symptoms including esophageal burning, bitter taste in his mouth which occurs every morning upon waking. Hasn't taken over-the-counter medications with minimal relief. Denies dysphagia or odynophagia. Denies abdominal pain, hematochezia, melena. Denies chest pain, dyspnea, dizziness, lightheadedness, syncope, near syncope. Denies any other upper or lower GI symptoms.   Past Medical History  Diagnosis Date  . Chronic kidney disease     Awaiting transplant  . Hypertension   . Diabetes     No past surgical history on file.  Current Outpatient Prescriptions  Medication Sig Dispense Refill  . amLODipine (NORVASC) 5 MG tablet Take 10 mg by mouth daily.    . cloNIDine (CATAPRES - DOSED IN MG/24 HR) 0.2 mg/24hr patch Place 0.2 mg onto the skin once a week. Pt states he is on 0.3 mg patch and changes every week    . doxazosin (CARDURA) 4 MG tablet Take 4 mg by mouth daily.    . furosemide (LASIX) 40 MG tablet Take 40 mg by mouth. Take 3 po daily    . glimepiride (AMARYL) 4 MG tablet Take 4 mg by mouth daily with breakfast.    . lovastatin (MEVACOR) 20 MG tablet Take 20 mg by mouth 2 (two) times daily.    . multivitamin (RENA-VIT) TABS tablet Take 1 tablet by mouth daily.    [name] omeprazole (PRILOSEC) 20 MG capsule Take 1 capsule (20 mg total) by mouth daily. 90 capsule 3  . peg 3350 powder (MOVIPREP) 100 G SOLR Take 1 kit (200 g total) by mouth as directed. 1 kit 0  . sevelamer carbonate (RENVELA) 800 MG tablet Take 800 mg by mouth 3 (three) times daily with meals.       No current facility-administered medications for this visit.    Allergies as of 11/15/2014  . (No Known Allergies)    Family History  Problem Relation Age of Onset  . Colon cancer Neg Hx     History   Social History  . Marital Status: Married    Spouse Name: N/A  . Number of Children: N/A  . Years of Education: N/A   Occupational History  . Not on file.   Social History Main Topics  . Smoking status: Never Smoker   . Smokeless tobacco: Not on file  . Alcohol Use: No  . Drug Use: No  . Sexual Activity: Not on file   Other Topics Concern  . Not on file   Social History Narrative    Review of Systems: 10 point ROS negative except as per HPI.    Physical Exam: There were no vitals taken for this visit. General:   Alert and oriented. Pleasant and cooperative. Well-nourished and well-developed.  Head:  Normocephalic and atraumatic. Eyes:  Without icterus, sclera clear and conjunctiva pink.  Ears:  Normal auditory acuity. Cardiovascular:  S1, S2 present without murmurs appreciated. Normal pulses noted. Extremities without clubbing or edema. Respiratory:  Clear to auscultation bilaterally. No wheezes, rales, or rhonchi. No distress.  Gastrointestinal:  +BS, soft, non-tender and non-distended. No HSM noted. No guarding or rebound.  No masses appreciated.  Rectal:  Deferred  Skin:  Intact without significant lesions or rashes. Heme/Lymph/Immune: No excessive bruising noted.    11/15/2014 11:50 AM

## 2014-11-15 NOTE — Assessment & Plan Note (Signed)
54 year old male with no previous colonoscopy. He is a tablet transplant list and is ready for an organ, however they have told him he will be removed from the active list until he has his initial screening colonoscopy. At this point we'll proceed with his colonoscopy to allow him to receive a kidney should one become available.  Proceed with colonoscopy with Dr. Oneida Alar in the near future. The risks, benefits, and alternatives have been discussed in detail with the patient. They state understanding and desire to proceed.   The patient is not on any anticoagulants, anxiolytics, chronic pain medicines, or antidepressants. Denies alcohol and drug use. Conscious sedation should be adequate for his procedure.

## 2014-11-15 NOTE — Assessment & Plan Note (Signed)
Patient with persistent daily GERD symptoms not currently on PPI. We'll start him on Prilosec 20 mg daily. Return for follow-up in 3 months further evaluation pending transplant status. No red flag/warning signs or symptoms at this point.

## 2014-11-15 NOTE — Progress Notes (Signed)
cc'd to pcp 

## 2014-11-16 DIAGNOSIS — Z992 Dependence on renal dialysis: Secondary | ICD-10-CM | POA: Diagnosis not present

## 2014-11-16 DIAGNOSIS — D509 Iron deficiency anemia, unspecified: Secondary | ICD-10-CM | POA: Diagnosis not present

## 2014-11-16 DIAGNOSIS — D631 Anemia in chronic kidney disease: Secondary | ICD-10-CM | POA: Diagnosis not present

## 2014-11-16 DIAGNOSIS — N186 End stage renal disease: Secondary | ICD-10-CM | POA: Diagnosis not present

## 2014-11-16 DIAGNOSIS — N2581 Secondary hyperparathyroidism of renal origin: Secondary | ICD-10-CM | POA: Diagnosis not present

## 2014-11-18 DIAGNOSIS — D509 Iron deficiency anemia, unspecified: Secondary | ICD-10-CM | POA: Diagnosis not present

## 2014-11-18 DIAGNOSIS — N186 End stage renal disease: Secondary | ICD-10-CM | POA: Diagnosis not present

## 2014-11-18 DIAGNOSIS — N2581 Secondary hyperparathyroidism of renal origin: Secondary | ICD-10-CM | POA: Diagnosis not present

## 2014-11-18 DIAGNOSIS — Z992 Dependence on renal dialysis: Secondary | ICD-10-CM | POA: Diagnosis not present

## 2014-11-18 DIAGNOSIS — D631 Anemia in chronic kidney disease: Secondary | ICD-10-CM | POA: Diagnosis not present

## 2014-11-21 DIAGNOSIS — D509 Iron deficiency anemia, unspecified: Secondary | ICD-10-CM | POA: Diagnosis not present

## 2014-11-21 DIAGNOSIS — N2581 Secondary hyperparathyroidism of renal origin: Secondary | ICD-10-CM | POA: Diagnosis not present

## 2014-11-21 DIAGNOSIS — D631 Anemia in chronic kidney disease: Secondary | ICD-10-CM | POA: Diagnosis not present

## 2014-11-21 DIAGNOSIS — N186 End stage renal disease: Secondary | ICD-10-CM | POA: Diagnosis not present

## 2014-11-21 DIAGNOSIS — Z992 Dependence on renal dialysis: Secondary | ICD-10-CM | POA: Diagnosis not present

## 2014-11-23 DIAGNOSIS — D509 Iron deficiency anemia, unspecified: Secondary | ICD-10-CM | POA: Diagnosis not present

## 2014-11-23 DIAGNOSIS — D631 Anemia in chronic kidney disease: Secondary | ICD-10-CM | POA: Diagnosis not present

## 2014-11-23 DIAGNOSIS — N2581 Secondary hyperparathyroidism of renal origin: Secondary | ICD-10-CM | POA: Diagnosis not present

## 2014-11-23 DIAGNOSIS — Z992 Dependence on renal dialysis: Secondary | ICD-10-CM | POA: Diagnosis not present

## 2014-11-23 DIAGNOSIS — N186 End stage renal disease: Secondary | ICD-10-CM | POA: Diagnosis not present

## 2014-11-25 DIAGNOSIS — Z992 Dependence on renal dialysis: Secondary | ICD-10-CM | POA: Diagnosis not present

## 2014-11-25 DIAGNOSIS — N186 End stage renal disease: Secondary | ICD-10-CM | POA: Diagnosis not present

## 2014-11-25 DIAGNOSIS — N2581 Secondary hyperparathyroidism of renal origin: Secondary | ICD-10-CM | POA: Diagnosis not present

## 2014-11-25 DIAGNOSIS — D631 Anemia in chronic kidney disease: Secondary | ICD-10-CM | POA: Diagnosis not present

## 2014-11-25 DIAGNOSIS — D509 Iron deficiency anemia, unspecified: Secondary | ICD-10-CM | POA: Diagnosis not present

## 2014-11-28 ENCOUNTER — Other Ambulatory Visit: Payer: Self-pay

## 2014-11-28 DIAGNOSIS — N2581 Secondary hyperparathyroidism of renal origin: Secondary | ICD-10-CM | POA: Diagnosis not present

## 2014-11-28 DIAGNOSIS — D509 Iron deficiency anemia, unspecified: Secondary | ICD-10-CM | POA: Diagnosis not present

## 2014-11-28 DIAGNOSIS — D631 Anemia in chronic kidney disease: Secondary | ICD-10-CM | POA: Diagnosis not present

## 2014-11-28 DIAGNOSIS — N186 End stage renal disease: Secondary | ICD-10-CM | POA: Diagnosis not present

## 2014-11-28 DIAGNOSIS — Z992 Dependence on renal dialysis: Secondary | ICD-10-CM | POA: Diagnosis not present

## 2014-11-28 MED ORDER — PEG 3350-KCL-NA BICARB-NACL 420 G PO SOLR: 4000.0000 mL | Freq: Once | ORAL | Status: DC

## 2014-11-30 ENCOUNTER — Encounter (HOSPITAL_COMMUNITY): Payer: Self-pay

## 2014-11-30 DIAGNOSIS — N186 End stage renal disease: Secondary | ICD-10-CM | POA: Diagnosis not present

## 2014-11-30 DIAGNOSIS — Z992 Dependence on renal dialysis: Secondary | ICD-10-CM | POA: Diagnosis not present

## 2014-11-30 DIAGNOSIS — D631 Anemia in chronic kidney disease: Secondary | ICD-10-CM | POA: Diagnosis not present

## 2014-11-30 DIAGNOSIS — N2581 Secondary hyperparathyroidism of renal origin: Secondary | ICD-10-CM | POA: Diagnosis not present

## 2014-11-30 DIAGNOSIS — D509 Iron deficiency anemia, unspecified: Secondary | ICD-10-CM | POA: Diagnosis not present

## 2014-12-02 DIAGNOSIS — N2581 Secondary hyperparathyroidism of renal origin: Secondary | ICD-10-CM | POA: Diagnosis not present

## 2014-12-02 DIAGNOSIS — D631 Anemia in chronic kidney disease: Secondary | ICD-10-CM | POA: Diagnosis not present

## 2014-12-02 DIAGNOSIS — D509 Iron deficiency anemia, unspecified: Secondary | ICD-10-CM | POA: Diagnosis not present

## 2014-12-02 DIAGNOSIS — N186 End stage renal disease: Secondary | ICD-10-CM | POA: Diagnosis not present

## 2014-12-02 DIAGNOSIS — Z992 Dependence on renal dialysis: Secondary | ICD-10-CM | POA: Diagnosis not present

## 2014-12-04 DIAGNOSIS — D631 Anemia in chronic kidney disease: Secondary | ICD-10-CM | POA: Diagnosis not present

## 2014-12-04 DIAGNOSIS — Z992 Dependence on renal dialysis: Secondary | ICD-10-CM | POA: Diagnosis not present

## 2014-12-04 DIAGNOSIS — N186 End stage renal disease: Secondary | ICD-10-CM | POA: Diagnosis not present

## 2014-12-04 DIAGNOSIS — N2581 Secondary hyperparathyroidism of renal origin: Secondary | ICD-10-CM | POA: Diagnosis not present

## 2014-12-04 DIAGNOSIS — D509 Iron deficiency anemia, unspecified: Secondary | ICD-10-CM | POA: Diagnosis not present

## 2014-12-04 DIAGNOSIS — E119 Type 2 diabetes mellitus without complications: Secondary | ICD-10-CM | POA: Diagnosis not present

## 2014-12-05 ENCOUNTER — Ambulatory Visit (HOSPITAL_COMMUNITY)
Admission: RE | Admit: 2014-12-05 | Discharge: 2014-12-05 | Disposition: A | Discharge status: Home / Self Care | Payer: Medicare Other | Source: Ambulatory Visit | Attending: Gastroenterology | Admitting: Gastroenterology

## 2014-12-05 ENCOUNTER — Encounter (HOSPITAL_COMMUNITY)
Admission: RE | Disposition: A | Discharge status: Home / Self Care | Payer: Self-pay | Source: Ambulatory Visit | Attending: Gastroenterology

## 2014-12-05 ENCOUNTER — Encounter (HOSPITAL_COMMUNITY): Payer: Self-pay | Admitting: *Deleted

## 2014-12-05 DIAGNOSIS — K621 Rectal polyp: Secondary | ICD-10-CM | POA: Insufficient documentation

## 2014-12-05 DIAGNOSIS — K2990 Gastroduodenitis, unspecified, without bleeding: Secondary | ICD-10-CM

## 2014-12-05 DIAGNOSIS — K635 Polyp of colon: Secondary | ICD-10-CM

## 2014-12-05 DIAGNOSIS — D127 Benign neoplasm of rectosigmoid junction: Secondary | ICD-10-CM | POA: Insufficient documentation

## 2014-12-05 DIAGNOSIS — D128 Benign neoplasm of rectum: Secondary | ICD-10-CM | POA: Diagnosis not present

## 2014-12-05 DIAGNOSIS — K2980 Duodenitis without bleeding: Secondary | ICD-10-CM | POA: Diagnosis not present

## 2014-12-05 DIAGNOSIS — K2970 Gastritis, unspecified, without bleeding: Secondary | ICD-10-CM | POA: Diagnosis not present

## 2014-12-05 DIAGNOSIS — E119 Type 2 diabetes mellitus without complications: Secondary | ICD-10-CM | POA: Diagnosis not present

## 2014-12-05 DIAGNOSIS — K2950 Unspecified chronic gastritis without bleeding: Secondary | ICD-10-CM | POA: Insufficient documentation

## 2014-12-05 DIAGNOSIS — I129 Hypertensive chronic kidney disease with stage 1 through stage 4 chronic kidney disease, or unspecified chronic kidney disease: Secondary | ICD-10-CM | POA: Diagnosis not present

## 2014-12-05 DIAGNOSIS — Z1211 Encounter for screening for malignant neoplasm of colon: Secondary | ICD-10-CM | POA: Insufficient documentation

## 2014-12-05 DIAGNOSIS — Z79899 Other long term (current) drug therapy: Secondary | ICD-10-CM | POA: Diagnosis not present

## 2014-12-05 DIAGNOSIS — Z800 Family history of malignant neoplasm of digestive organs: Secondary | ICD-10-CM | POA: Diagnosis not present

## 2014-12-05 DIAGNOSIS — K219 Gastro-esophageal reflux disease without esophagitis: Secondary | ICD-10-CM | POA: Insufficient documentation

## 2014-12-05 DIAGNOSIS — N189 Chronic kidney disease, unspecified: Secondary | ICD-10-CM | POA: Insufficient documentation

## 2014-12-05 HISTORY — PX: ESOPHAGOGASTRODUODENOSCOPY: SHX5428

## 2014-12-05 HISTORY — PX: COLONOSCOPY: SHX5424

## 2014-12-05 LAB — GLUCOSE, CAPILLARY: GLUCOSE-CAPILLARY: 133 mg/dL — AB (ref 65–99)

## 2014-12-05 SURGERY — COLONOSCOPY
Anesthesia: Moderate Sedation

## 2014-12-05 MED ORDER — FENTANYL CITRATE (PF) 100 MCG/2ML IJ SOLN
INTRAMUSCULAR | Status: DC | PRN
  Administered 2014-12-05 (×2): 25 ug via INTRAVENOUS
  Administered 2014-12-05: 50 ug via INTRAVENOUS

## 2014-12-05 MED ORDER — SODIUM CHLORIDE 0.9 % IV SOLN
INTRAVENOUS | Status: DC
  Administered 2014-12-05: 11:00:00 via INTRAVENOUS

## 2014-12-05 MED ORDER — LIDOCAINE VISCOUS 2 % MT SOLN
OROMUCOSAL | Status: DC | PRN
  Administered 2014-12-05: 5 mL via OROMUCOSAL

## 2014-12-05 MED ORDER — MIDAZOLAM HCL 5 MG/5ML IJ SOLN
INTRAMUSCULAR | Status: AC
  Filled 2014-12-05: qty 10

## 2014-12-05 MED ORDER — MIDAZOLAM HCL 5 MG/5ML IJ SOLN
INTRAMUSCULAR | Status: DC
Start: 2014-12-05 — End: 2014-12-05
  Filled 2014-12-05: qty 5

## 2014-12-05 MED ORDER — MEPERIDINE HCL 100 MG/ML IJ SOLN
INTRAMUSCULAR | Status: AC
  Filled 2014-12-05: qty 2

## 2014-12-05 MED ORDER — MIDAZOLAM HCL 5 MG/5ML IJ SOLN
INTRAMUSCULAR | Status: DC | PRN
  Administered 2014-12-05: 1 mg via INTRAVENOUS
  Administered 2014-12-05 (×3): 2 mg via INTRAVENOUS
  Administered 2014-12-05: 1 mg via INTRAVENOUS

## 2014-12-05 MED ORDER — LIDOCAINE VISCOUS 2 % MT SOLN
OROMUCOSAL | Status: AC
  Filled 2014-12-05: qty 15

## 2014-12-05 MED ORDER — FENTANYL CITRATE (PF) 100 MCG/2ML IJ SOLN
INTRAMUSCULAR | Status: AC
  Filled 2014-12-05: qty 2

## 2014-12-05 NOTE — Progress Notes (Addendum)
REVIEWED. Pt needs Egd for Barrett's esophagus/dyspepsia.

## 2014-12-05 NOTE — Op Note (Signed)
[facility] [address] [place], [address]   COLONOSCOPY PROCEDURE REPORT  PATIENT: Raymond [name], Raymond [name]  MR#: [id number] BIRTHDATE: 08-05-1960 , 9  yrs. old GENDER: male ENDOSCOPIST: Danie Binder, MD REFERRED [id number] Fanta, M.D. PROCEDURE DATE:  11-Dec-2014 PROCEDURE:   Colonoscopy with cold biopsy polypectomy and Colonoscopy with snare polypectomy INDICATIONS:average risk patient for colon cancer. MEDICATIONS: Fentanyl 100 mcg IV and Versed 5 mg IV  DESCRIPTION OF PROCEDURE:    Physical exam was performed.  Informed consent was obtained from the patient after explaining the benefits, risks, and alternatives to procedure.  The patient was connected to monitor and placed in left lateral position. Continuous oxygen was provided by nasal cannula and IV medicine administered through an indwelling cannula.  After administration of sedation and rectal exam, the patients rectum was intubated and the [id number]  colonoscope was advanced under direct visualization to the cecum.  The scope was removed slowly by carefully examining the color, texture, anatomy, and integrity mucosa on the way out.  The patient was recovered in endoscopy and discharged home in satisfactory condition. Estimated blood loss is zero unless otherwise noted in this procedure report.    COLON FINDINGS: Six sessile polyps ranging from 2 to 42mm in size were found in the rectosigmoid colon.  A polypectomy was performed with cold forceps.  , Five sessile polyps ranging from 5 to 8mm in size were found.  A polypectomy was performed using snare cautery. , There was mild diverticulosis noted in the sigmoid colon with associated muscular hypertrophy and tortuosity.  , and Moderate sized external and internal hemorrhoids were found.  PREP QUALITY: good. AFTER IRRIGATION/SUCTION. PT ATE [facility] YESTERDAY AFTERNOON. CECAL W/D TIME: 32       minutes   COMPLICATIONS: None  ENDOSCOPIC  IMPRESSION: 1.   11 COLONRECTAL  polyps REMOVED 2.   Mild diverticulosis in the sigmoid colon 3.   Moderate sized external and internal hemorrhoids  RECOMMENDATIONS: NO MRI FOR 30 DAYS. NO HEPARIN WITH DIALYSIS UNTIL JUL 19.  [place]. FOLLOW A LOW FAT/HIGH FIBER DIET. CONTINUE OMEPRAZOLE 30 mins prior to [place]. AVOID ITEMS THAT TRIGGER GASTRITIS. AWAIT BIOPSY RESULTS. FOLLOW UP IN 4 MOS.  eSigned:  Danie Binder, MD 2014-12-11 3:01 PM   CPT CODES: ICD CODES:  The ICD and CPT codes recommended by this software are interpretations from the data that the clinical staff has captured with the software.  The verification of the translation of this report to the ICD and CPT codes and modifiers is the sole responsibility of the health care institution and practicing physician where this report was generated.  [employer] [facility]. will not be held responsible for the validity of the ICD and CPT codes included on this report.  AMA assumes no liability for data contained or not contained herein. CPT is a Designer, television/film set of the [employer].

## 2014-12-05 NOTE — Discharge Instructions (Signed)
YOU HAD 11 POLYPS REMOVED. YOUR HAVE NODULAR GASTRITIS. You have internal hemorrhoids & DIVERTICULOSIS.  I BIOPSIED YOUR STOMACH. I PLACED TWO CLIPS IN YOUR STOMACH TO PREVENT BLEEDING IN 7-10 DAYS.   NO MRI FOR 30 DAYS.  NO HEPARIN WITH DIALYSIS UNTIL JUL 19.  FOLLOW A LOW FAT/HIGH FIBER DIET. AVOID ITEMS THAT CAUSE BLOATING. SEE INFO BELOW.  CONTINUE OMEPRAZOLE 30 mins prior to [place].  AVOID ITEMS THAT TRIGGER GASTRITIS. SEE INFO BELOW  YOUR BIOPSY RESULTS WILL BE AVAILABLE IN MY CHART AFTER JUL 14 AND MY OFFICE WILL CONTACT YOU IN 10-14 DAYS WITH YOUR RESULTS.   FOLLOW UP IN 4 MOS.   ENDOSCOPY Care After Read the instructions outlined below and refer to this sheet in the next week. These discharge instructions provide you with general information on caring for yourself after you leave the hospital. While your treatment has been planned according to the most current medical practices available, unavoidable complications occasionally occur. If you have any problems or questions after discharge, call DR. Trumaine Wimer, [phone].  ACTIVITY  You may resume your regular activity, but move at a slower pace for the next 24 hours.   Take frequent rest periods for the next 24 hours.   Walking will help get rid of the air and reduce the bloated feeling in your belly (abdomen).   No driving for 24 hours (because of the medicine (anesthesia) used during the test).   You may shower.   Do not sign any important legal documents or operate any machinery for 24 hours (because of the anesthesia used during the test).    NUTRITION  Drink plenty of fluids.   You may resume your normal diet as instructed by your doctor.   Begin with a light meal and progress to your normal diet. Heavy or fried foods are harder to digest and may make you feel sick to your stomach (nauseated).   Avoid alcoholic beverages for 24 hours or as instructed.    MEDICATIONS  You may resume your normal  medications.   WHAT YOU CAN EXPECT TODAY  Some feelings of bloating in the abdomen.   Passage of more gas than usual.   Spotting of blood in your stool or on the toilet paper  .  IF YOU HAD POLYPS REMOVED DURING THE ENDOSCOPY:  Eat a soft diet IF YOU HAVE NAUSEA, BLOATING, ABDOMINAL PAIN, OR VOMITING.    FINDING OUT THE RESULTS OF YOUR TEST Not all test results are available during your visit. DR. Oneida Alar WILL CALL YOU WITHIN 14 DAYS OF YOUR PROCEDUE WITH YOUR RESULTS. Do not assume everything is normal if you have not heard from DR. Naoki Migliaccio, CALL HER OFFICE AT [phone].  SEEK IMMEDIATE MEDICAL ATTENTION AND CALL THE OFFICE: [phone] IF:  You have more than a spotting of blood in your stool.   Your belly is swollen (abdominal distention).   You are nauseated or vomiting.   You have a temperature over 101F.   You have abdominal pain or discomfort that is severe or gets worse throughout the day.   Gastritis  Gastritis is an inflammation (the body's way of reacting to injury and/or infection) of the stomach. It is often caused by bacterial (germ) infections. It can also be caused BY ASPIRIN, BC/GOODY POWDER'S, (IBUPROFEN) MOTRIN, OR ALEVE (NAPROXEN), chemicals (including alcohol), SPICY FOODS, and medications. This illness may be associated with generalized malaise (feeling tired, not well), UPPER ABDOMINAL STOMACH cramps, and fever. One common bacterial cause of  gastritis is an organism known as H. Pylori. This can be treated with antibiotics.    High-Fiber Diet A high-fiber diet changes your normal diet to include more whole grains, legumes, fruits, and vegetables. Changes in the diet involve replacing refined carbohydrates with unrefined foods. The calorie level of the diet is essentially unchanged. The Dietary Reference Intake (recommended amount) for adult males is 38 grams per day. For adult females, it is 25 grams per day. Pregnant and lactating women should consume  28 grams of fiber per day. Fiber is the intact part of a plant that is not broken down during digestion. Functional fiber is fiber that has been isolated from the plant to provide a beneficial effect in the body. PURPOSE  Increase stool bulk.   Ease and regulate bowel movements.   Lower cholesterol.  REDUCE RISK OF COLON CANCER  DO NOT USE WITH:  Acute diverticulitis (intestine infection).   Partial small bowel obstructions.   Complicated diverticular disease involving bleeding, rupture (perforation), or abscess (boil, furuncle).   Presence of autonomic neuropathy (nerve damage) or gastroparesis (stomach cannot empty itself).    GUIDELINES FOR INCREASING FIBER IN THE DIET  Start adding fiber to the diet slowly. A gradual increase of about 5 more grams (2 slices of whole-wheat bread, 2 servings of most fruits or vegetables, or 1 bowl of high-fiber cereal) per day is best. Too rapid an increase in fiber may result in constipation, flatulence, and bloating.   Drink enough water and fluids to keep your urine clear or pale yellow. Water, juice, or caffeine-free drinks are recommended. Not drinking enough fluid may cause constipation.   Eat a variety of high-fiber foods rather than one type of fiber.   Try to increase your intake of fiber through using high-fiber foods rather than fiber pills or supplements that contain small amounts of fiber.   The goal is to change the types of food eaten. Do not supplement your present diet with high-fiber foods, but replace foods in your present diet.    INCLUDE A VARIETY OF FIBER SOURCES  Replace refined and processed grains with whole grains, canned fruits with fresh fruits, and incorporate other fiber sources. White rice, white breads, and most bakery goods contain little or no fiber.   Brown whole-grain rice, buckwheat oats, and many fruits and vegetables are all good sources of fiber. These include: broccoli, Brussels sprouts, cabbage,  cauliflower, beets, sweet potatoes, white potatoes (skin on), carrots, tomatoes, eggplant, squash, berries, fresh fruits, and dried fruits.   Cereals appear to be the richest source of fiber. Cereal fiber is found in whole grains and bran. Bran is the fiber-rich outer coat of cereal grain, which is largely removed in refining. In whole-grain cereals, the bran remains. In breakfast cereals, the largest amount of fiber is found in those with "bran" in their names. The fiber content is sometimes indicated on the label.   You may need to include additional fruits and vegetables each day.   In baking, for 1 cup white flour, you may use the following substitutions:   1 cup whole-wheat flour minus 2 tablespoons.   1/2 cup white flour plus 1/2 cup whole-wheat flour.   Low-Fat Diet BREADS, CEREALS, PASTA, RICE, DRIED PEAS, AND BEANS These products are high in carbohydrates and most are low in fat. Therefore, they can be increased in the diet as substitutes for fatty foods. They too, however, contain calories and should not be eaten in excess. Cereals can be eaten for  snacks as well as for breakfast.  Include foods that contain fiber (fruits, vegetables, whole grains, and legumes). Research shows that fiber may lower blood cholesterol levels, especially the water-soluble fiber found in fruits, vegetables, oat products, and legumes. FRUITS AND VEGETABLES It is good to eat fruits and vegetables. Besides being sources of fiber, both are rich in vitamins and some minerals. They help you get the daily allowances of these nutrients. Fruits and vegetables can be used for snacks and desserts. MEATS Limit lean meat, chicken, [place], and fish to no more than 6 ounces per day. Beef, Pork, and Lamb Use lean cuts of beef, pork, and lamb. Lean cuts include:  Extra-lean ground beef.  Arm roast.  Sirloin tip.  Center-cut ham.  Round steak.  Loin chops.  Rump roast.  Tenderloin.  Trim all fat off the outside of  meats before cooking. It is not necessary to severely decrease the intake of red meat, but lean choices should be made. Lean meat is rich in protein and contains a highly absorbable form of iron. Premenopausal women, in particular, should avoid reducing lean red meat because this could increase the risk for low red blood cells (iron-deficiency anemia). The organ meats, such as liver, sweetbreads, kidneys, and brain are very rich in cholesterol. They should be limited. Chicken and [place] These are good sources of protein. The fat of poultry can be reduced by removing the skin and underlying fat layers before cooking. Chicken and [place] can be substituted for lean red meat in the diet. Poultry should not be fried or covered with high-fat sauces. Fish and Shellfish Fish is a good source of protein. Shellfish contain cholesterol, but they usually are low in saturated fatty acids. The preparation of fish is important. Like chicken and [place], they should not be fried or covered with high-fat sauces. EGGS Egg whites contain no fat or cholesterol. They can be eaten often. Try 1 to 2 egg whites instead of whole eggs in recipes or use egg substitutes that do not contain yolk. MILK AND DAIRY PRODUCTS Use skim or 1% milk instead of 2% or whole milk. Decrease whole milk, natural, and processed cheeses. Use nonfat or low-fat (2%) cottage cheese or low-fat cheeses made from vegetable oils. Choose nonfat or low-fat (1 to 2%) yogurt. Experiment with evaporated skim milk in recipes that call for heavy cream. Substitute low-fat yogurt or low-fat cottage cheese for sour cream in dips and salad dressings. Have at least 2 servings of low-fat dairy products, such as 2 glasses of skim (or 1%) milk each day to help get your daily calcium intake.  FATS AND OILS Reduce the total intake of fats, especially saturated fat. Butterfat, lard, and beef fats are high in saturated fat and cholesterol. These should be avoided as much as  possible. Vegetable fats do not contain cholesterol, but certain vegetable fats, such as coconut oil, palm oil, and palm kernel oil are very high in saturated fats. These should be limited. These fats are often used in bakery goods, processed foods, popcorn, oils, and nondairy creamers. Vegetable shortenings and some peanut butters contain hydrogenated oils, which are also saturated fats. Read the labels on these foods and check for saturated vegetable oils. Unsaturated vegetable oils and fats do not raise blood cholesterol. However, they should be limited because they are fats and are high in calories. Total fat should still be limited to 30% of your daily caloric intake. Desirable liquid vegetable oils are corn oil, cottonseed oil, olive oil,  canola oil, safflower oil, soybean oil, and sunflower oil. Peanut oil is not as good, but small amounts are acceptable. Buy a heart-healthy tub margarine that has no partially hydrogenated oils in the ingredients. Mayonnaise and salad dressings often are made from unsaturated fats, but they should also be limited because of their high calorie and fat content. Seeds, nuts, peanut butter, olives, and avocados are high in fat, but the fat is mainly the unsaturated type. These foods should be limited mainly to avoid excess calories and fat. OTHER EATING TIPS Snacks  Most sweets should be limited as snacks. They tend to be rich in calories and fats, and their caloric content outweighs their nutritional value. Some good choices in snacks are graham crackers, melba toast, soda crackers, bagels (no egg), English muffins, fruits, and vegetables. These snacks are preferable to snack crackers, [place] fries, and chips. Popcorn should be air-popped or cooked in small amounts of liquid vegetable oil. Desserts Eat fruit, low-fat yogurt, and fruit ices. AVOID pastries, cake, and cookies. Sherbet, angel food cake, gelatin dessert, frozen low-fat yogurt, or other frozen products that do  not contain saturated fat (pure fruit juice bars, frozen ice pops) are also acceptable.  COOKING METHODS Choose those methods that use little or no fat. They include: Poaching.  Braising.  Steaming.  Grilling.  Baking.  Stir-frying.  Broiling.  Microwaving.  Foods can be cooked in a nonstick pan without added fat, or use a nonfat cooking spray in regular cookware. Limit fried foods and avoid frying in saturated fat. Add moisture to lean meats by using water, broth, cooking wines, and other nonfat or low-fat sauces along with the cooking methods mentioned above. Soups and stews should be chilled after cooking. The fat that forms on top after a few hours in the refrigerator should be skimmed off. When preparing meals, avoid using excess salt. Salt can contribute to raising blood pressure in some people. EATING AWAY FROM HOME Order entres, potatoes, and vegetables without sauces or butter. When meat exceeds the size of a deck of cards (3 to 4 ounces), the rest can be taken home for another meal. Choose vegetable or fruit salads and ask for low-calorie salad dressings to be served on the side. Use dressings sparingly. Limit high-fat toppings, such as bacon, crumbled eggs, cheese, sunflower seeds, and olives. Ask for heart-healthy tub margarine instead of butter.  Hemorrhoids Hemorrhoids are dilated (enlarged) veins around the rectum. Sometimes clots will form in the veins. This makes them swollen and painful. These are called thrombosed hemorrhoids. Causes of hemorrhoids include:  Constipation.   Straining to have a bowel movement.   HEAVY LIFTING  HOME CARE INSTRUCTIONS  Eat a well balanced diet and drink 6 to 8 glasses of water every day to avoid constipation. You may also use a bulk laxative.   Avoid straining to have bowel movements.   Keep anal area dry and clean.   Do not use a donut shaped pillow or sit on the toilet for long periods. This increases blood pooling and pain.    Move your bowels when your body has the urge; this will require less straining and will decrease pain and pressure.

## 2014-12-05 NOTE — H&P (Signed)
Primary Care Physician:  Rosita Fire, MD Primary Gastroenterologist:  Dr. Oneida Alar  Pre-Procedure History & Physical: HPI:  Raymond [name] is a 54 y.o. male here for SCREENING/dyspepsia.  Past Medical History  Diagnosis Date  . Renal disorder   . Diabetes mellitus without complication   . Chronic kidney disease     Awaiting transplant  . Hypertension   . Diabetes    History reviewed. No pertinent past surgical history.  Prior to Admission medications   Medication Sig Start Date End Date Taking? Authorizing Provider  amLODipine (NORVASC) 10 MG tablet Take 10 mg by mouth daily.   Yes Historical Provider, MD  amLODipine (NORVASC) 5 MG tablet Take 5 mg by mouth daily.    Historical Provider, MD  B Complex-C-Folic Acid (DIALYVITE PO) Take 1 tablet by mouth daily.    Historical Provider, MD  calcium acetate (PHOSLO) 667 MG capsule Take 2,001-2,668 mg by mouth See admin instructions. Take 4 capsules with every meal and 3 capsules with every snack    Historical Provider, MD  cloNIDine (CATAPRES - DOSED IN MG/24 HR) 0.2 mg/24hr patch Place 0.2 mg onto the skin once a week. Pt states he is on 0.3 mg patch and changes every week   Yes Historical Provider, MD  cloNIDine (CATAPRES) 0.2 MG tablet Take 0.2 mg by mouth 2 (two) times daily. Hold if heart rate is less than 55    Historical Provider, MD  doxazosin (CARDURA) 4 MG tablet Take 4 mg by mouth daily.   Yes Historical Provider, MD  furosemide (LASIX) 40 MG tablet Take 40 mg by mouth 2 (two) times daily. Take 3 po daily   Yes Historical Provider, MD  furosemide (LASIX) 80 MG tablet Take 80 mg by mouth daily. Take on non-dialysis days (Monday, Wednesday, and Friday)    Historical Provider, MD  glimepiride (AMARYL) 4 MG tablet Take 4 mg by mouth daily with breakfast.   Yes Historical Provider, MD  lovastatin (MEVACOR) 20 MG tablet Take 20 mg by mouth at bedtime.    Historical Provider, MD  lovastatin (MEVACOR) 20 MG tablet Take 20 mg by mouth at  bedtime.    Yes Historical Provider, MD  metoCLOPramide (REGLAN) 5 MG tablet Take 5 mg by mouth daily.    Historical Provider, MD  multivitamin (RENA-VIT) TABS tablet Take 1 tablet by mouth daily.   Yes Historical Provider, MD  omeprazole (PRILOSEC) 20 MG capsule Take 1 capsule (20 mg total) by mouth daily. 11/15/14  Yes Carlis Stable, NP  ondansetron (ZOFRAN ODT) 4 MG disintegrating tablet Take 1 tablet (4 mg total) by mouth every 8 (eight) hours as needed for nausea. 05/19/12   Fredia Sorrow, MD  polyethylene glycol-electrolytes (NULYTELY/GOLYTELY) 420 G solution Take 4,000 mLs by mouth once. 11/28/14  Yes Danie Binder, MD  ranitidine (ZANTAC) 75 MG tablet Take 75 mg by mouth daily as needed. For acid reflux    Historical Provider, MD  sevelamer carbonate (RENVELA) 800 MG tablet Take 800 mg by mouth 3 (three) times daily with meals.   Yes Historical Provider, MD    Allergies as of 11/15/2014  . (No Known Allergies)    Family History  Problem Relation Age of Onset  . Colon cancer Neg Hx     History   Social History  . Marital Status: Married    Spouse Name: N/A  . Number of Children: N/A  . Years of Education: N/A   Occupational History  . Not on file.  Social History Main Topics  . Smoking status: Never Smoker   . Smokeless tobacco: Not on file  . Alcohol Use: No  . Drug Use: No  . Sexual Activity: Not on file   Other Topics Concern  . Not on file   Social History Narrative   ** Merged History Encounter **        Review of Systems: See HPI, otherwise negative ROS   Physical Exam: BP 158/87 mmHg  Pulse 57  Temp(Src) 97.8 F (36.6 C) (Oral)  Resp 14  Ht 5\' 7"  (1.702 m)  Wt 215 lb (97.523 kg)  BMI 33.67 kg/m2  SpO2 100% General:   Alert,  pleasant and cooperative in NAD Head:  Normocephalic and atraumatic. Neck:  Supple; Lungs:  Clear throughout to auscultation.    Heart:  Regular rate and rhythm. Abdomen:  Soft, nontender and nondistended. Normal bowel  sounds, without guarding, and without rebound.   Neurologic:  Alert and  oriented x4;  grossly normal neurologically.  Impression/Plan:    SCREENING/dyspepsia  Plan:  1. TCS/EGD TODAY

## 2014-12-05 NOTE — Op Note (Signed)
[facility] [address] [place], [address]   ENDOSCOPY PROCEDURE REPORT  PATIENT: Raymond [name], Raymond [name]  MR#: [id number] BIRTHDATE: 26-Jun-1960 , 50  yrs. old GENDER: male  ENDOSCOPIST: Danie Binder, MD REFERRED [id number] Fanta, M.D.  PROCEDURE DATE: December 23, 2014 PROCEDURE:   EGD w/ biopsy  INDICATIONS:dyspepsia. MEDICATIONS: TCS+ Versed 3 mg IV  PT HAS HD QTUTHSAT. TOPICAL ANESTHETIC:   Viscous Xylocaine ASA CLASS:  DESCRIPTION OF PROCEDURE:     Physical exam was performed.  Informed consent was obtained from the patient after explaining the benefits, risks, and alternatives to the procedure.  The patient was connected to the monitor and placed in the left lateral position.  Continuous oxygen was provided by nasal cannula and IV medicine administered through an indwelling cannula.  After administration of sedation, the patients esophagus was intubated and the [id number]  endoscope was advanced under direct visualization to the second portion of the duodenum.  The scope was removed slowly by carefully examining the color, texture, anatomy, and integrity of the mucosa on the way out.  The patient was recovered in endoscopy and discharged home in satisfactory condition.  Estimated blood loss is zero unless otherwise noted in this procedure report.    ESOPHAGUS: The mucosa of the esophagus appeared normal.   STOMACH: Moderate nodular gastritis (inflammation) was found in the gastric antrum and gastric body.  Multiple biopsies were performed using cold forceps.   TWO CLIPS PLACED DUE TO ACTIVE BLEEDING AFTER COLD BIOPSIES. DUODENUM: Mild duodenal inflammation was found in the duodenal bulb.   The duodenal mucosa showed no abnormalities in the 2nd part of the duodenum. COMPLICATIONS: There were no immediate complications.  ENDOSCOPIC IMPRESSION: 1.   DYSPEPSIA DUE TO GERD, MODERATE GASTRITIS, AND MILD DUODENITIS  RECOMMENDATIONS: NO MRI FOR 30  DAYS. NO HEPARIN WITH DIALYSIS UNTIL JUL 19.  [place]. FOLLOW A LOW FAT/HIGH FIBER DIET. CONTINUE OMEPRAZOLE 30 mins prior to [place]. AVOID ITEMS THAT TRIGGER GASTRITIS. AWAIT BIOPSY RESULTS. FOLLOW UP IN 4 MOS.  REPEAT EXAM: eSigned:  Danie Binder, MD 12-23-2014 3:25 PM  CPT CODES: ICD CODES:  The ICD and CPT codes recommended by this software are interpretations from the data that the clinical staff has captured with the software.  The verification of the translation of this report to the ICD and CPT codes and modifiers is the sole responsibility of the health care institution and practicing physician where this report was generated.  [place]. will not be held responsible for the validity of the ICD and CPT codes included on this report.  AMA assumes no liability for data contained or not contained herein. CPT is a Designer, television/film set of the [employer].

## 2014-12-07 DIAGNOSIS — D631 Anemia in chronic kidney disease: Secondary | ICD-10-CM | POA: Diagnosis not present

## 2014-12-07 DIAGNOSIS — N2581 Secondary hyperparathyroidism of renal origin: Secondary | ICD-10-CM | POA: Diagnosis not present

## 2014-12-07 DIAGNOSIS — Z992 Dependence on renal dialysis: Secondary | ICD-10-CM | POA: Diagnosis not present

## 2014-12-07 DIAGNOSIS — N186 End stage renal disease: Secondary | ICD-10-CM | POA: Diagnosis not present

## 2014-12-07 DIAGNOSIS — D509 Iron deficiency anemia, unspecified: Secondary | ICD-10-CM | POA: Diagnosis not present

## 2014-12-09 DIAGNOSIS — N186 End stage renal disease: Secondary | ICD-10-CM | POA: Diagnosis not present

## 2014-12-09 DIAGNOSIS — D509 Iron deficiency anemia, unspecified: Secondary | ICD-10-CM | POA: Diagnosis not present

## 2014-12-09 DIAGNOSIS — Z992 Dependence on renal dialysis: Secondary | ICD-10-CM | POA: Diagnosis not present

## 2014-12-09 DIAGNOSIS — D631 Anemia in chronic kidney disease: Secondary | ICD-10-CM | POA: Diagnosis not present

## 2014-12-09 DIAGNOSIS — N2581 Secondary hyperparathyroidism of renal origin: Secondary | ICD-10-CM | POA: Diagnosis not present

## 2014-12-11 ENCOUNTER — Encounter (HOSPITAL_COMMUNITY): Payer: Self-pay | Admitting: Gastroenterology

## 2014-12-12 DIAGNOSIS — D631 Anemia in chronic kidney disease: Secondary | ICD-10-CM | POA: Diagnosis not present

## 2014-12-12 DIAGNOSIS — Z992 Dependence on renal dialysis: Secondary | ICD-10-CM | POA: Diagnosis not present

## 2014-12-12 DIAGNOSIS — N186 End stage renal disease: Secondary | ICD-10-CM | POA: Diagnosis not present

## 2014-12-12 DIAGNOSIS — N2581 Secondary hyperparathyroidism of renal origin: Secondary | ICD-10-CM | POA: Diagnosis not present

## 2014-12-12 DIAGNOSIS — D509 Iron deficiency anemia, unspecified: Secondary | ICD-10-CM | POA: Diagnosis not present

## 2014-12-13 ENCOUNTER — Telehealth: Payer: Self-pay | Admitting: Gastroenterology

## 2014-12-13 NOTE — Telephone Encounter (Signed)
Please call pt. HE had 11 polypoid lesion removed and THEY ARE ALL benign. His stomach Bx shows gastritis AND BENIGN STOMACH POLYPS.    RE-START HEPARIN WITH DIALYSIS JUL 19.  NO MRI UNTIL AUG 12.  FOLLOW A LOW FAT/HIGH FIBER DIET. AVOID ITEMS THAT CAUSE BLOATING.   CONTINUE OMEPRAZOLE 30 mins prior to [place].  AVOID ITEMS THAT TRIGGER GASTRITIS.   FOLLOW UP IN 4 MOS.  NEXT TCS IN 10 YEARS.

## 2014-12-14 ENCOUNTER — Encounter: Payer: Self-pay | Admitting: Gastroenterology

## 2014-12-14 DIAGNOSIS — N2581 Secondary hyperparathyroidism of renal origin: Secondary | ICD-10-CM | POA: Diagnosis not present

## 2014-12-14 DIAGNOSIS — Z0000 Encounter for general adult medical examination without abnormal findings: Secondary | ICD-10-CM | POA: Diagnosis not present

## 2014-12-14 DIAGNOSIS — Z992 Dependence on renal dialysis: Secondary | ICD-10-CM | POA: Diagnosis not present

## 2014-12-14 DIAGNOSIS — N186 End stage renal disease: Secondary | ICD-10-CM | POA: Diagnosis not present

## 2014-12-14 DIAGNOSIS — E1165 Type 2 diabetes mellitus with hyperglycemia: Secondary | ICD-10-CM | POA: Diagnosis not present

## 2014-12-14 DIAGNOSIS — D509 Iron deficiency anemia, unspecified: Secondary | ICD-10-CM | POA: Diagnosis not present

## 2014-12-14 DIAGNOSIS — D631 Anemia in chronic kidney disease: Secondary | ICD-10-CM | POA: Diagnosis not present

## 2014-12-14 NOTE — Telephone Encounter (Signed)
Pt is aware of results. 

## 2014-12-14 NOTE — Telephone Encounter (Signed)
APPOINTMENT MADE AND LETTER SENT °

## 2014-12-16 DIAGNOSIS — N186 End stage renal disease: Secondary | ICD-10-CM | POA: Diagnosis not present

## 2014-12-16 DIAGNOSIS — D631 Anemia in chronic kidney disease: Secondary | ICD-10-CM | POA: Diagnosis not present

## 2014-12-16 DIAGNOSIS — N2581 Secondary hyperparathyroidism of renal origin: Secondary | ICD-10-CM | POA: Diagnosis not present

## 2014-12-16 DIAGNOSIS — D509 Iron deficiency anemia, unspecified: Secondary | ICD-10-CM | POA: Diagnosis not present

## 2014-12-16 DIAGNOSIS — Z992 Dependence on renal dialysis: Secondary | ICD-10-CM | POA: Diagnosis not present

## 2014-12-19 DIAGNOSIS — D631 Anemia in chronic kidney disease: Secondary | ICD-10-CM | POA: Diagnosis not present

## 2014-12-19 DIAGNOSIS — N2581 Secondary hyperparathyroidism of renal origin: Secondary | ICD-10-CM | POA: Diagnosis not present

## 2014-12-19 DIAGNOSIS — Z992 Dependence on renal dialysis: Secondary | ICD-10-CM | POA: Diagnosis not present

## 2014-12-19 DIAGNOSIS — N186 End stage renal disease: Secondary | ICD-10-CM | POA: Diagnosis not present

## 2014-12-19 DIAGNOSIS — D509 Iron deficiency anemia, unspecified: Secondary | ICD-10-CM | POA: Diagnosis not present

## 2014-12-21 DIAGNOSIS — D631 Anemia in chronic kidney disease: Secondary | ICD-10-CM | POA: Diagnosis not present

## 2014-12-21 DIAGNOSIS — N2581 Secondary hyperparathyroidism of renal origin: Secondary | ICD-10-CM | POA: Diagnosis not present

## 2014-12-21 DIAGNOSIS — Z992 Dependence on renal dialysis: Secondary | ICD-10-CM | POA: Diagnosis not present

## 2014-12-21 DIAGNOSIS — D509 Iron deficiency anemia, unspecified: Secondary | ICD-10-CM | POA: Diagnosis not present

## 2014-12-21 DIAGNOSIS — N186 End stage renal disease: Secondary | ICD-10-CM | POA: Diagnosis not present

## 2014-12-23 DIAGNOSIS — D631 Anemia in chronic kidney disease: Secondary | ICD-10-CM | POA: Diagnosis not present

## 2014-12-23 DIAGNOSIS — N186 End stage renal disease: Secondary | ICD-10-CM | POA: Diagnosis not present

## 2014-12-23 DIAGNOSIS — N2581 Secondary hyperparathyroidism of renal origin: Secondary | ICD-10-CM | POA: Diagnosis not present

## 2014-12-23 DIAGNOSIS — Z992 Dependence on renal dialysis: Secondary | ICD-10-CM | POA: Diagnosis not present

## 2014-12-23 DIAGNOSIS — D509 Iron deficiency anemia, unspecified: Secondary | ICD-10-CM | POA: Diagnosis not present

## 2014-12-24 DIAGNOSIS — Z992 Dependence on renal dialysis: Secondary | ICD-10-CM | POA: Diagnosis not present

## 2014-12-24 DIAGNOSIS — N186 End stage renal disease: Secondary | ICD-10-CM | POA: Diagnosis not present

## 2014-12-26 DIAGNOSIS — N2581 Secondary hyperparathyroidism of renal origin: Secondary | ICD-10-CM | POA: Diagnosis not present

## 2014-12-26 DIAGNOSIS — N186 End stage renal disease: Secondary | ICD-10-CM | POA: Diagnosis not present

## 2014-12-26 DIAGNOSIS — D509 Iron deficiency anemia, unspecified: Secondary | ICD-10-CM | POA: Diagnosis not present

## 2014-12-26 DIAGNOSIS — D631 Anemia in chronic kidney disease: Secondary | ICD-10-CM | POA: Diagnosis not present

## 2014-12-26 DIAGNOSIS — Z992 Dependence on renal dialysis: Secondary | ICD-10-CM | POA: Diagnosis not present

## 2014-12-28 DIAGNOSIS — N186 End stage renal disease: Secondary | ICD-10-CM | POA: Diagnosis not present

## 2014-12-28 DIAGNOSIS — N2581 Secondary hyperparathyroidism of renal origin: Secondary | ICD-10-CM | POA: Diagnosis not present

## 2014-12-28 DIAGNOSIS — D509 Iron deficiency anemia, unspecified: Secondary | ICD-10-CM | POA: Diagnosis not present

## 2014-12-28 DIAGNOSIS — D631 Anemia in chronic kidney disease: Secondary | ICD-10-CM | POA: Diagnosis not present

## 2014-12-28 DIAGNOSIS — Z992 Dependence on renal dialysis: Secondary | ICD-10-CM | POA: Diagnosis not present

## 2014-12-30 DIAGNOSIS — N2581 Secondary hyperparathyroidism of renal origin: Secondary | ICD-10-CM | POA: Diagnosis not present

## 2014-12-30 DIAGNOSIS — D509 Iron deficiency anemia, unspecified: Secondary | ICD-10-CM | POA: Diagnosis not present

## 2014-12-30 DIAGNOSIS — Z992 Dependence on renal dialysis: Secondary | ICD-10-CM | POA: Diagnosis not present

## 2014-12-30 DIAGNOSIS — D631 Anemia in chronic kidney disease: Secondary | ICD-10-CM | POA: Diagnosis not present

## 2014-12-30 DIAGNOSIS — N186 End stage renal disease: Secondary | ICD-10-CM | POA: Diagnosis not present

## 2015-01-02 DIAGNOSIS — Z992 Dependence on renal dialysis: Secondary | ICD-10-CM | POA: Diagnosis not present

## 2015-01-02 DIAGNOSIS — D631 Anemia in chronic kidney disease: Secondary | ICD-10-CM | POA: Diagnosis not present

## 2015-01-02 DIAGNOSIS — N2581 Secondary hyperparathyroidism of renal origin: Secondary | ICD-10-CM | POA: Diagnosis not present

## 2015-01-02 DIAGNOSIS — D509 Iron deficiency anemia, unspecified: Secondary | ICD-10-CM | POA: Diagnosis not present

## 2015-01-02 DIAGNOSIS — N186 End stage renal disease: Secondary | ICD-10-CM | POA: Diagnosis not present

## 2015-01-04 DIAGNOSIS — N186 End stage renal disease: Secondary | ICD-10-CM | POA: Diagnosis not present

## 2015-01-04 DIAGNOSIS — D631 Anemia in chronic kidney disease: Secondary | ICD-10-CM | POA: Diagnosis not present

## 2015-01-04 DIAGNOSIS — D509 Iron deficiency anemia, unspecified: Secondary | ICD-10-CM | POA: Diagnosis not present

## 2015-01-04 DIAGNOSIS — N2581 Secondary hyperparathyroidism of renal origin: Secondary | ICD-10-CM | POA: Diagnosis not present

## 2015-01-04 DIAGNOSIS — Z992 Dependence on renal dialysis: Secondary | ICD-10-CM | POA: Diagnosis not present

## 2015-01-05 DIAGNOSIS — Z992 Dependence on renal dialysis: Secondary | ICD-10-CM | POA: Diagnosis not present

## 2015-01-05 DIAGNOSIS — N186 End stage renal disease: Secondary | ICD-10-CM | POA: Diagnosis not present

## 2015-01-05 DIAGNOSIS — D631 Anemia in chronic kidney disease: Secondary | ICD-10-CM | POA: Diagnosis not present

## 2015-01-05 DIAGNOSIS — D509 Iron deficiency anemia, unspecified: Secondary | ICD-10-CM | POA: Diagnosis not present

## 2015-01-05 DIAGNOSIS — N2581 Secondary hyperparathyroidism of renal origin: Secondary | ICD-10-CM | POA: Diagnosis not present

## 2015-01-09 DIAGNOSIS — D509 Iron deficiency anemia, unspecified: Secondary | ICD-10-CM | POA: Diagnosis not present

## 2015-01-09 DIAGNOSIS — D631 Anemia in chronic kidney disease: Secondary | ICD-10-CM | POA: Diagnosis not present

## 2015-01-09 DIAGNOSIS — Z992 Dependence on renal dialysis: Secondary | ICD-10-CM | POA: Diagnosis not present

## 2015-01-09 DIAGNOSIS — N2581 Secondary hyperparathyroidism of renal origin: Secondary | ICD-10-CM | POA: Diagnosis not present

## 2015-01-09 DIAGNOSIS — N186 End stage renal disease: Secondary | ICD-10-CM | POA: Diagnosis not present

## 2015-01-11 DIAGNOSIS — N2581 Secondary hyperparathyroidism of renal origin: Secondary | ICD-10-CM | POA: Diagnosis not present

## 2015-01-11 DIAGNOSIS — D509 Iron deficiency anemia, unspecified: Secondary | ICD-10-CM | POA: Diagnosis not present

## 2015-01-11 DIAGNOSIS — Z992 Dependence on renal dialysis: Secondary | ICD-10-CM | POA: Diagnosis not present

## 2015-01-11 DIAGNOSIS — D631 Anemia in chronic kidney disease: Secondary | ICD-10-CM | POA: Diagnosis not present

## 2015-01-11 DIAGNOSIS — N186 End stage renal disease: Secondary | ICD-10-CM | POA: Diagnosis not present

## 2015-01-13 DIAGNOSIS — Z992 Dependence on renal dialysis: Secondary | ICD-10-CM | POA: Diagnosis not present

## 2015-01-13 DIAGNOSIS — D631 Anemia in chronic kidney disease: Secondary | ICD-10-CM | POA: Diagnosis not present

## 2015-01-13 DIAGNOSIS — N186 End stage renal disease: Secondary | ICD-10-CM | POA: Diagnosis not present

## 2015-01-13 DIAGNOSIS — N2581 Secondary hyperparathyroidism of renal origin: Secondary | ICD-10-CM | POA: Diagnosis not present

## 2015-01-13 DIAGNOSIS — D509 Iron deficiency anemia, unspecified: Secondary | ICD-10-CM | POA: Diagnosis not present

## 2015-01-16 DIAGNOSIS — N2581 Secondary hyperparathyroidism of renal origin: Secondary | ICD-10-CM | POA: Diagnosis not present

## 2015-01-16 DIAGNOSIS — D509 Iron deficiency anemia, unspecified: Secondary | ICD-10-CM | POA: Diagnosis not present

## 2015-01-16 DIAGNOSIS — D631 Anemia in chronic kidney disease: Secondary | ICD-10-CM | POA: Diagnosis not present

## 2015-01-16 DIAGNOSIS — Z992 Dependence on renal dialysis: Secondary | ICD-10-CM | POA: Diagnosis not present

## 2015-01-16 DIAGNOSIS — N186 End stage renal disease: Secondary | ICD-10-CM | POA: Diagnosis not present

## 2015-01-18 DIAGNOSIS — D509 Iron deficiency anemia, unspecified: Secondary | ICD-10-CM | POA: Diagnosis not present

## 2015-01-18 DIAGNOSIS — N2581 Secondary hyperparathyroidism of renal origin: Secondary | ICD-10-CM | POA: Diagnosis not present

## 2015-01-18 DIAGNOSIS — Z992 Dependence on renal dialysis: Secondary | ICD-10-CM | POA: Diagnosis not present

## 2015-01-18 DIAGNOSIS — D631 Anemia in chronic kidney disease: Secondary | ICD-10-CM | POA: Diagnosis not present

## 2015-01-18 DIAGNOSIS — N186 End stage renal disease: Secondary | ICD-10-CM | POA: Diagnosis not present

## 2015-01-20 DIAGNOSIS — N2581 Secondary hyperparathyroidism of renal origin: Secondary | ICD-10-CM | POA: Diagnosis not present

## 2015-01-20 DIAGNOSIS — D509 Iron deficiency anemia, unspecified: Secondary | ICD-10-CM | POA: Diagnosis not present

## 2015-01-20 DIAGNOSIS — D631 Anemia in chronic kidney disease: Secondary | ICD-10-CM | POA: Diagnosis not present

## 2015-01-20 DIAGNOSIS — N186 End stage renal disease: Secondary | ICD-10-CM | POA: Diagnosis not present

## 2015-01-20 DIAGNOSIS — Z992 Dependence on renal dialysis: Secondary | ICD-10-CM | POA: Diagnosis not present

## 2015-01-23 DIAGNOSIS — D509 Iron deficiency anemia, unspecified: Secondary | ICD-10-CM | POA: Diagnosis not present

## 2015-01-23 DIAGNOSIS — Z992 Dependence on renal dialysis: Secondary | ICD-10-CM | POA: Diagnosis not present

## 2015-01-23 DIAGNOSIS — D631 Anemia in chronic kidney disease: Secondary | ICD-10-CM | POA: Diagnosis not present

## 2015-01-23 DIAGNOSIS — N2581 Secondary hyperparathyroidism of renal origin: Secondary | ICD-10-CM | POA: Diagnosis not present

## 2015-01-23 DIAGNOSIS — N186 End stage renal disease: Secondary | ICD-10-CM | POA: Diagnosis not present

## 2015-01-24 DIAGNOSIS — N186 End stage renal disease: Secondary | ICD-10-CM | POA: Diagnosis not present

## 2015-01-24 DIAGNOSIS — Z992 Dependence on renal dialysis: Secondary | ICD-10-CM | POA: Diagnosis not present

## 2015-01-25 DIAGNOSIS — E8779 Other fluid overload: Secondary | ICD-10-CM | POA: Diagnosis not present

## 2015-01-25 DIAGNOSIS — N186 End stage renal disease: Secondary | ICD-10-CM | POA: Diagnosis not present

## 2015-01-25 DIAGNOSIS — D631 Anemia in chronic kidney disease: Secondary | ICD-10-CM | POA: Diagnosis not present

## 2015-01-25 DIAGNOSIS — N2581 Secondary hyperparathyroidism of renal origin: Secondary | ICD-10-CM | POA: Diagnosis not present

## 2015-01-25 DIAGNOSIS — Z23 Encounter for immunization: Secondary | ICD-10-CM | POA: Diagnosis not present

## 2015-01-25 DIAGNOSIS — Z992 Dependence on renal dialysis: Secondary | ICD-10-CM | POA: Diagnosis not present

## 2015-01-25 DIAGNOSIS — D509 Iron deficiency anemia, unspecified: Secondary | ICD-10-CM | POA: Diagnosis not present

## 2015-01-27 DIAGNOSIS — D509 Iron deficiency anemia, unspecified: Secondary | ICD-10-CM | POA: Diagnosis not present

## 2015-01-27 DIAGNOSIS — N2581 Secondary hyperparathyroidism of renal origin: Secondary | ICD-10-CM | POA: Diagnosis not present

## 2015-01-27 DIAGNOSIS — N186 End stage renal disease: Secondary | ICD-10-CM | POA: Diagnosis not present

## 2015-01-27 DIAGNOSIS — Z23 Encounter for immunization: Secondary | ICD-10-CM | POA: Diagnosis not present

## 2015-01-27 DIAGNOSIS — D631 Anemia in chronic kidney disease: Secondary | ICD-10-CM | POA: Diagnosis not present

## 2015-01-27 DIAGNOSIS — Z992 Dependence on renal dialysis: Secondary | ICD-10-CM | POA: Diagnosis not present

## 2015-01-30 DIAGNOSIS — N2581 Secondary hyperparathyroidism of renal origin: Secondary | ICD-10-CM | POA: Diagnosis not present

## 2015-01-30 DIAGNOSIS — D631 Anemia in chronic kidney disease: Secondary | ICD-10-CM | POA: Diagnosis not present

## 2015-01-30 DIAGNOSIS — N186 End stage renal disease: Secondary | ICD-10-CM | POA: Diagnosis not present

## 2015-01-30 DIAGNOSIS — D509 Iron deficiency anemia, unspecified: Secondary | ICD-10-CM | POA: Diagnosis not present

## 2015-01-30 DIAGNOSIS — Z992 Dependence on renal dialysis: Secondary | ICD-10-CM | POA: Diagnosis not present

## 2015-01-30 DIAGNOSIS — Z23 Encounter for immunization: Secondary | ICD-10-CM | POA: Diagnosis not present

## 2015-01-31 DIAGNOSIS — H25013 Cortical age-related cataract, bilateral: Secondary | ICD-10-CM | POA: Diagnosis not present

## 2015-01-31 DIAGNOSIS — E11351 Type 2 diabetes mellitus with proliferative diabetic retinopathy with macular edema: Secondary | ICD-10-CM | POA: Diagnosis not present

## 2015-02-01 DIAGNOSIS — Z23 Encounter for immunization: Secondary | ICD-10-CM | POA: Diagnosis not present

## 2015-02-01 DIAGNOSIS — N2581 Secondary hyperparathyroidism of renal origin: Secondary | ICD-10-CM | POA: Diagnosis not present

## 2015-02-01 DIAGNOSIS — Z992 Dependence on renal dialysis: Secondary | ICD-10-CM | POA: Diagnosis not present

## 2015-02-01 DIAGNOSIS — D631 Anemia in chronic kidney disease: Secondary | ICD-10-CM | POA: Diagnosis not present

## 2015-02-01 DIAGNOSIS — D509 Iron deficiency anemia, unspecified: Secondary | ICD-10-CM | POA: Diagnosis not present

## 2015-02-01 DIAGNOSIS — N186 End stage renal disease: Secondary | ICD-10-CM | POA: Diagnosis not present

## 2015-02-03 DIAGNOSIS — D509 Iron deficiency anemia, unspecified: Secondary | ICD-10-CM | POA: Diagnosis not present

## 2015-02-03 DIAGNOSIS — N2581 Secondary hyperparathyroidism of renal origin: Secondary | ICD-10-CM | POA: Diagnosis not present

## 2015-02-03 DIAGNOSIS — Z23 Encounter for immunization: Secondary | ICD-10-CM | POA: Diagnosis not present

## 2015-02-03 DIAGNOSIS — Z992 Dependence on renal dialysis: Secondary | ICD-10-CM | POA: Diagnosis not present

## 2015-02-03 DIAGNOSIS — D631 Anemia in chronic kidney disease: Secondary | ICD-10-CM | POA: Diagnosis not present

## 2015-02-03 DIAGNOSIS — N186 End stage renal disease: Secondary | ICD-10-CM | POA: Diagnosis not present

## 2015-02-06 DIAGNOSIS — N2581 Secondary hyperparathyroidism of renal origin: Secondary | ICD-10-CM | POA: Diagnosis not present

## 2015-02-06 DIAGNOSIS — D631 Anemia in chronic kidney disease: Secondary | ICD-10-CM | POA: Diagnosis not present

## 2015-02-06 DIAGNOSIS — Z992 Dependence on renal dialysis: Secondary | ICD-10-CM | POA: Diagnosis not present

## 2015-02-06 DIAGNOSIS — N186 End stage renal disease: Secondary | ICD-10-CM | POA: Diagnosis not present

## 2015-02-06 DIAGNOSIS — Z23 Encounter for immunization: Secondary | ICD-10-CM | POA: Diagnosis not present

## 2015-02-06 DIAGNOSIS — D509 Iron deficiency anemia, unspecified: Secondary | ICD-10-CM | POA: Diagnosis not present

## 2015-02-08 DIAGNOSIS — N186 End stage renal disease: Secondary | ICD-10-CM | POA: Diagnosis not present

## 2015-02-08 DIAGNOSIS — Z23 Encounter for immunization: Secondary | ICD-10-CM | POA: Diagnosis not present

## 2015-02-08 DIAGNOSIS — Z992 Dependence on renal dialysis: Secondary | ICD-10-CM | POA: Diagnosis not present

## 2015-02-08 DIAGNOSIS — N2581 Secondary hyperparathyroidism of renal origin: Secondary | ICD-10-CM | POA: Diagnosis not present

## 2015-02-08 DIAGNOSIS — D509 Iron deficiency anemia, unspecified: Secondary | ICD-10-CM | POA: Diagnosis not present

## 2015-02-08 DIAGNOSIS — D631 Anemia in chronic kidney disease: Secondary | ICD-10-CM | POA: Diagnosis not present

## 2015-02-10 DIAGNOSIS — D631 Anemia in chronic kidney disease: Secondary | ICD-10-CM | POA: Diagnosis not present

## 2015-02-10 DIAGNOSIS — Z23 Encounter for immunization: Secondary | ICD-10-CM | POA: Diagnosis not present

## 2015-02-10 DIAGNOSIS — N2581 Secondary hyperparathyroidism of renal origin: Secondary | ICD-10-CM | POA: Diagnosis not present

## 2015-02-10 DIAGNOSIS — Z992 Dependence on renal dialysis: Secondary | ICD-10-CM | POA: Diagnosis not present

## 2015-02-10 DIAGNOSIS — N186 End stage renal disease: Secondary | ICD-10-CM | POA: Diagnosis not present

## 2015-02-10 DIAGNOSIS — D509 Iron deficiency anemia, unspecified: Secondary | ICD-10-CM | POA: Diagnosis not present

## 2015-02-13 DIAGNOSIS — D509 Iron deficiency anemia, unspecified: Secondary | ICD-10-CM | POA: Diagnosis not present

## 2015-02-13 DIAGNOSIS — D631 Anemia in chronic kidney disease: Secondary | ICD-10-CM | POA: Diagnosis not present

## 2015-02-13 DIAGNOSIS — Z23 Encounter for immunization: Secondary | ICD-10-CM | POA: Diagnosis not present

## 2015-02-13 DIAGNOSIS — Z992 Dependence on renal dialysis: Secondary | ICD-10-CM | POA: Diagnosis not present

## 2015-02-13 DIAGNOSIS — N2581 Secondary hyperparathyroidism of renal origin: Secondary | ICD-10-CM | POA: Diagnosis not present

## 2015-02-13 DIAGNOSIS — N186 End stage renal disease: Secondary | ICD-10-CM | POA: Diagnosis not present

## 2015-02-14 DIAGNOSIS — N186 End stage renal disease: Secondary | ICD-10-CM | POA: Diagnosis not present

## 2015-02-14 DIAGNOSIS — D631 Anemia in chronic kidney disease: Secondary | ICD-10-CM | POA: Diagnosis not present

## 2015-02-14 DIAGNOSIS — N2581 Secondary hyperparathyroidism of renal origin: Secondary | ICD-10-CM | POA: Diagnosis not present

## 2015-02-14 DIAGNOSIS — D509 Iron deficiency anemia, unspecified: Secondary | ICD-10-CM | POA: Diagnosis not present

## 2015-02-14 DIAGNOSIS — Z23 Encounter for immunization: Secondary | ICD-10-CM | POA: Diagnosis not present

## 2015-02-14 DIAGNOSIS — Z992 Dependence on renal dialysis: Secondary | ICD-10-CM | POA: Diagnosis not present

## 2015-02-15 DIAGNOSIS — D631 Anemia in chronic kidney disease: Secondary | ICD-10-CM | POA: Diagnosis not present

## 2015-02-15 DIAGNOSIS — N2581 Secondary hyperparathyroidism of renal origin: Secondary | ICD-10-CM | POA: Diagnosis not present

## 2015-02-15 DIAGNOSIS — Z992 Dependence on renal dialysis: Secondary | ICD-10-CM | POA: Diagnosis not present

## 2015-02-15 DIAGNOSIS — Z23 Encounter for immunization: Secondary | ICD-10-CM | POA: Diagnosis not present

## 2015-02-15 DIAGNOSIS — D509 Iron deficiency anemia, unspecified: Secondary | ICD-10-CM | POA: Diagnosis not present

## 2015-02-15 DIAGNOSIS — N186 End stage renal disease: Secondary | ICD-10-CM | POA: Diagnosis not present

## 2015-02-17 DIAGNOSIS — N186 End stage renal disease: Secondary | ICD-10-CM | POA: Diagnosis not present

## 2015-02-17 DIAGNOSIS — D631 Anemia in chronic kidney disease: Secondary | ICD-10-CM | POA: Diagnosis not present

## 2015-02-17 DIAGNOSIS — Z23 Encounter for immunization: Secondary | ICD-10-CM | POA: Diagnosis not present

## 2015-02-17 DIAGNOSIS — Z992 Dependence on renal dialysis: Secondary | ICD-10-CM | POA: Diagnosis not present

## 2015-02-17 DIAGNOSIS — N2581 Secondary hyperparathyroidism of renal origin: Secondary | ICD-10-CM | POA: Diagnosis not present

## 2015-02-17 DIAGNOSIS — D509 Iron deficiency anemia, unspecified: Secondary | ICD-10-CM | POA: Diagnosis not present

## 2015-02-20 DIAGNOSIS — Z23 Encounter for immunization: Secondary | ICD-10-CM | POA: Diagnosis not present

## 2015-02-20 DIAGNOSIS — N2581 Secondary hyperparathyroidism of renal origin: Secondary | ICD-10-CM | POA: Diagnosis not present

## 2015-02-20 DIAGNOSIS — N186 End stage renal disease: Secondary | ICD-10-CM | POA: Diagnosis not present

## 2015-02-20 DIAGNOSIS — D509 Iron deficiency anemia, unspecified: Secondary | ICD-10-CM | POA: Diagnosis not present

## 2015-02-20 DIAGNOSIS — D631 Anemia in chronic kidney disease: Secondary | ICD-10-CM | POA: Diagnosis not present

## 2015-02-20 DIAGNOSIS — Z992 Dependence on renal dialysis: Secondary | ICD-10-CM | POA: Diagnosis not present

## 2015-02-22 DIAGNOSIS — Z992 Dependence on renal dialysis: Secondary | ICD-10-CM | POA: Diagnosis not present

## 2015-02-22 DIAGNOSIS — N2581 Secondary hyperparathyroidism of renal origin: Secondary | ICD-10-CM | POA: Diagnosis not present

## 2015-02-22 DIAGNOSIS — N186 End stage renal disease: Secondary | ICD-10-CM | POA: Diagnosis not present

## 2015-02-22 DIAGNOSIS — Z23 Encounter for immunization: Secondary | ICD-10-CM | POA: Diagnosis not present

## 2015-02-22 DIAGNOSIS — D631 Anemia in chronic kidney disease: Secondary | ICD-10-CM | POA: Diagnosis not present

## 2015-02-22 DIAGNOSIS — D509 Iron deficiency anemia, unspecified: Secondary | ICD-10-CM | POA: Diagnosis not present

## 2015-02-23 DIAGNOSIS — N186 End stage renal disease: Secondary | ICD-10-CM | POA: Diagnosis not present

## 2015-02-23 DIAGNOSIS — Z992 Dependence on renal dialysis: Secondary | ICD-10-CM | POA: Diagnosis not present

## 2015-02-24 DIAGNOSIS — N2581 Secondary hyperparathyroidism of renal origin: Secondary | ICD-10-CM | POA: Diagnosis not present

## 2015-02-24 DIAGNOSIS — D509 Iron deficiency anemia, unspecified: Secondary | ICD-10-CM | POA: Diagnosis not present

## 2015-02-24 DIAGNOSIS — N186 End stage renal disease: Secondary | ICD-10-CM | POA: Diagnosis not present

## 2015-02-24 DIAGNOSIS — Z992 Dependence on renal dialysis: Secondary | ICD-10-CM | POA: Diagnosis not present

## 2015-02-24 DIAGNOSIS — D631 Anemia in chronic kidney disease: Secondary | ICD-10-CM | POA: Diagnosis not present

## 2015-02-27 DIAGNOSIS — Z992 Dependence on renal dialysis: Secondary | ICD-10-CM | POA: Diagnosis not present

## 2015-02-27 DIAGNOSIS — D631 Anemia in chronic kidney disease: Secondary | ICD-10-CM | POA: Diagnosis not present

## 2015-02-27 DIAGNOSIS — N186 End stage renal disease: Secondary | ICD-10-CM | POA: Diagnosis not present

## 2015-02-27 DIAGNOSIS — N2581 Secondary hyperparathyroidism of renal origin: Secondary | ICD-10-CM | POA: Diagnosis not present

## 2015-02-27 DIAGNOSIS — D509 Iron deficiency anemia, unspecified: Secondary | ICD-10-CM | POA: Diagnosis not present

## 2015-03-01 DIAGNOSIS — D509 Iron deficiency anemia, unspecified: Secondary | ICD-10-CM | POA: Diagnosis not present

## 2015-03-01 DIAGNOSIS — Z992 Dependence on renal dialysis: Secondary | ICD-10-CM | POA: Diagnosis not present

## 2015-03-01 DIAGNOSIS — N186 End stage renal disease: Secondary | ICD-10-CM | POA: Diagnosis not present

## 2015-03-01 DIAGNOSIS — D631 Anemia in chronic kidney disease: Secondary | ICD-10-CM | POA: Diagnosis not present

## 2015-03-01 DIAGNOSIS — N2581 Secondary hyperparathyroidism of renal origin: Secondary | ICD-10-CM | POA: Diagnosis not present

## 2015-03-03 DIAGNOSIS — Z992 Dependence on renal dialysis: Secondary | ICD-10-CM | POA: Diagnosis not present

## 2015-03-03 DIAGNOSIS — D509 Iron deficiency anemia, unspecified: Secondary | ICD-10-CM | POA: Diagnosis not present

## 2015-03-03 DIAGNOSIS — N2581 Secondary hyperparathyroidism of renal origin: Secondary | ICD-10-CM | POA: Diagnosis not present

## 2015-03-03 DIAGNOSIS — N186 End stage renal disease: Secondary | ICD-10-CM | POA: Diagnosis not present

## 2015-03-03 DIAGNOSIS — D631 Anemia in chronic kidney disease: Secondary | ICD-10-CM | POA: Diagnosis not present

## 2015-03-06 DIAGNOSIS — E119 Type 2 diabetes mellitus without complications: Secondary | ICD-10-CM | POA: Diagnosis not present

## 2015-03-06 DIAGNOSIS — D509 Iron deficiency anemia, unspecified: Secondary | ICD-10-CM | POA: Diagnosis not present

## 2015-03-06 DIAGNOSIS — D631 Anemia in chronic kidney disease: Secondary | ICD-10-CM | POA: Diagnosis not present

## 2015-03-06 DIAGNOSIS — N186 End stage renal disease: Secondary | ICD-10-CM | POA: Diagnosis not present

## 2015-03-06 DIAGNOSIS — Z992 Dependence on renal dialysis: Secondary | ICD-10-CM | POA: Diagnosis not present

## 2015-03-06 DIAGNOSIS — N2581 Secondary hyperparathyroidism of renal origin: Secondary | ICD-10-CM | POA: Diagnosis not present

## 2015-03-08 DIAGNOSIS — N2581 Secondary hyperparathyroidism of renal origin: Secondary | ICD-10-CM | POA: Diagnosis not present

## 2015-03-08 DIAGNOSIS — D631 Anemia in chronic kidney disease: Secondary | ICD-10-CM | POA: Diagnosis not present

## 2015-03-08 DIAGNOSIS — Z992 Dependence on renal dialysis: Secondary | ICD-10-CM | POA: Diagnosis not present

## 2015-03-08 DIAGNOSIS — N186 End stage renal disease: Secondary | ICD-10-CM | POA: Diagnosis not present

## 2015-03-08 DIAGNOSIS — D509 Iron deficiency anemia, unspecified: Secondary | ICD-10-CM | POA: Diagnosis not present

## 2015-03-09 DIAGNOSIS — N170 Acute kidney failure with tubular necrosis: Secondary | ICD-10-CM | POA: Diagnosis not present

## 2015-03-09 DIAGNOSIS — K913 Postprocedural intestinal obstruction: Secondary | ICD-10-CM | POA: Diagnosis not present

## 2015-03-09 DIAGNOSIS — Z01818 Encounter for other preprocedural examination: Secondary | ICD-10-CM | POA: Diagnosis not present

## 2015-03-09 DIAGNOSIS — S37091A Other injury of right kidney, initial encounter: Secondary | ICD-10-CM | POA: Diagnosis not present

## 2015-03-09 DIAGNOSIS — E669 Obesity, unspecified: Secondary | ICD-10-CM | POA: Diagnosis present

## 2015-03-09 DIAGNOSIS — D649 Anemia, unspecified: Secondary | ICD-10-CM | POA: Diagnosis present

## 2015-03-09 DIAGNOSIS — Z940 Kidney transplant status: Secondary | ICD-10-CM | POA: Diagnosis not present

## 2015-03-09 DIAGNOSIS — I120 Hypertensive chronic kidney disease with stage 5 chronic kidney disease or end stage renal disease: Secondary | ICD-10-CM | POA: Diagnosis not present

## 2015-03-09 DIAGNOSIS — E785 Hyperlipidemia, unspecified: Secondary | ICD-10-CM | POA: Diagnosis present

## 2015-03-09 DIAGNOSIS — N186 End stage renal disease: Secondary | ICD-10-CM | POA: Diagnosis not present

## 2015-03-09 DIAGNOSIS — E11649 Type 2 diabetes mellitus with hypoglycemia without coma: Secondary | ICD-10-CM | POA: Diagnosis not present

## 2015-03-09 DIAGNOSIS — N250 Renal osteodystrophy: Secondary | ICD-10-CM | POA: Diagnosis present

## 2015-03-09 DIAGNOSIS — Z79899 Other long term (current) drug therapy: Secondary | ICD-10-CM | POA: Diagnosis not present

## 2015-03-09 DIAGNOSIS — Z524 Kidney donor: Secondary | ICD-10-CM | POA: Diagnosis not present

## 2015-03-09 DIAGNOSIS — E1122 Type 2 diabetes mellitus with diabetic chronic kidney disease: Secondary | ICD-10-CM | POA: Diagnosis not present

## 2015-03-09 DIAGNOSIS — Z452 Encounter for adjustment and management of vascular access device: Secondary | ICD-10-CM | POA: Diagnosis not present

## 2015-03-09 DIAGNOSIS — M109 Gout, unspecified: Secondary | ICD-10-CM | POA: Diagnosis present

## 2015-03-09 DIAGNOSIS — R918 Other nonspecific abnormal finding of lung field: Secondary | ICD-10-CM | POA: Diagnosis not present

## 2015-03-19 DIAGNOSIS — D649 Anemia, unspecified: Secondary | ICD-10-CM | POA: Diagnosis not present

## 2015-03-19 DIAGNOSIS — N179 Acute kidney failure, unspecified: Secondary | ICD-10-CM | POA: Diagnosis not present

## 2015-03-19 DIAGNOSIS — I120 Hypertensive chronic kidney disease with stage 5 chronic kidney disease or end stage renal disease: Secondary | ICD-10-CM | POA: Diagnosis not present

## 2015-03-19 DIAGNOSIS — Z992 Dependence on renal dialysis: Secondary | ICD-10-CM | POA: Diagnosis not present

## 2015-03-19 DIAGNOSIS — N186 End stage renal disease: Secondary | ICD-10-CM | POA: Diagnosis not present

## 2015-03-21 DIAGNOSIS — N179 Acute kidney failure, unspecified: Secondary | ICD-10-CM | POA: Diagnosis not present

## 2015-03-21 DIAGNOSIS — D649 Anemia, unspecified: Secondary | ICD-10-CM | POA: Diagnosis not present

## 2015-03-21 DIAGNOSIS — I120 Hypertensive chronic kidney disease with stage 5 chronic kidney disease or end stage renal disease: Secondary | ICD-10-CM | POA: Diagnosis not present

## 2015-03-21 DIAGNOSIS — Z992 Dependence on renal dialysis: Secondary | ICD-10-CM | POA: Diagnosis not present

## 2015-03-21 DIAGNOSIS — N186 End stage renal disease: Secondary | ICD-10-CM | POA: Diagnosis not present

## 2015-03-23 DIAGNOSIS — Z992 Dependence on renal dialysis: Secondary | ICD-10-CM | POA: Diagnosis not present

## 2015-03-23 DIAGNOSIS — N179 Acute kidney failure, unspecified: Secondary | ICD-10-CM | POA: Diagnosis not present

## 2015-03-23 DIAGNOSIS — N186 End stage renal disease: Secondary | ICD-10-CM | POA: Diagnosis not present

## 2015-03-23 DIAGNOSIS — I120 Hypertensive chronic kidney disease with stage 5 chronic kidney disease or end stage renal disease: Secondary | ICD-10-CM | POA: Diagnosis not present

## 2015-03-23 DIAGNOSIS — D649 Anemia, unspecified: Secondary | ICD-10-CM | POA: Diagnosis not present

## 2015-03-26 DIAGNOSIS — Z114 Encounter for screening for human immunodeficiency virus [HIV]: Secondary | ICD-10-CM | POA: Diagnosis not present

## 2015-03-26 DIAGNOSIS — Z79899 Other long term (current) drug therapy: Secondary | ICD-10-CM | POA: Diagnosis not present

## 2015-03-26 DIAGNOSIS — Z792 Long term (current) use of antibiotics: Secondary | ICD-10-CM | POA: Diagnosis not present

## 2015-03-26 DIAGNOSIS — B9789 Other viral agents as the cause of diseases classified elsewhere: Secondary | ICD-10-CM | POA: Diagnosis not present

## 2015-03-26 DIAGNOSIS — E559 Vitamin D deficiency, unspecified: Secondary | ICD-10-CM | POA: Diagnosis not present

## 2015-03-26 DIAGNOSIS — Z9689 Presence of other specified functional implants: Secondary | ICD-10-CM | POA: Diagnosis not present

## 2015-03-26 DIAGNOSIS — D631 Anemia in chronic kidney disease: Secondary | ICD-10-CM | POA: Diagnosis not present

## 2015-03-26 DIAGNOSIS — N250 Renal osteodystrophy: Secondary | ICD-10-CM | POA: Diagnosis not present

## 2015-03-26 DIAGNOSIS — E785 Hyperlipidemia, unspecified: Secondary | ICD-10-CM | POA: Diagnosis not present

## 2015-03-26 DIAGNOSIS — M109 Gout, unspecified: Secondary | ICD-10-CM | POA: Diagnosis not present

## 2015-03-26 DIAGNOSIS — N189 Chronic kidney disease, unspecified: Secondary | ICD-10-CM | POA: Diagnosis not present

## 2015-03-26 DIAGNOSIS — I10 Essential (primary) hypertension: Secondary | ICD-10-CM | POA: Diagnosis not present

## 2015-03-26 DIAGNOSIS — Z940 Kidney transplant status: Secondary | ICD-10-CM | POA: Diagnosis not present

## 2015-03-26 DIAGNOSIS — Z289 Immunization not carried out for unspecified reason: Secondary | ICD-10-CM | POA: Diagnosis not present

## 2015-03-26 DIAGNOSIS — E119 Type 2 diabetes mellitus without complications: Secondary | ICD-10-CM | POA: Diagnosis not present

## 2015-03-26 DIAGNOSIS — Z139 Encounter for screening, unspecified: Secondary | ICD-10-CM | POA: Diagnosis not present

## 2015-03-27 HISTORY — PX: KIDNEY TRANSPLANT: SHX239

## 2015-03-28 DIAGNOSIS — Z940 Kidney transplant status: Secondary | ICD-10-CM | POA: Diagnosis not present

## 2015-03-28 DIAGNOSIS — I10 Essential (primary) hypertension: Secondary | ICD-10-CM | POA: Diagnosis not present

## 2015-03-28 DIAGNOSIS — N250 Renal osteodystrophy: Secondary | ICD-10-CM | POA: Diagnosis not present

## 2015-03-28 DIAGNOSIS — E785 Hyperlipidemia, unspecified: Secondary | ICD-10-CM | POA: Diagnosis not present

## 2015-03-28 DIAGNOSIS — Z792 Long term (current) use of antibiotics: Secondary | ICD-10-CM | POA: Diagnosis not present

## 2015-03-28 DIAGNOSIS — Z79899 Other long term (current) drug therapy: Secondary | ICD-10-CM | POA: Diagnosis not present

## 2015-03-30 DIAGNOSIS — N250 Renal osteodystrophy: Secondary | ICD-10-CM | POA: Diagnosis not present

## 2015-03-30 DIAGNOSIS — Z940 Kidney transplant status: Secondary | ICD-10-CM | POA: Diagnosis not present

## 2015-03-30 DIAGNOSIS — I10 Essential (primary) hypertension: Secondary | ICD-10-CM | POA: Diagnosis not present

## 2015-03-30 DIAGNOSIS — Z79899 Other long term (current) drug therapy: Secondary | ICD-10-CM | POA: Diagnosis not present

## 2015-04-03 DIAGNOSIS — Z940 Kidney transplant status: Secondary | ICD-10-CM | POA: Diagnosis not present

## 2015-04-03 DIAGNOSIS — Z79899 Other long term (current) drug therapy: Secondary | ICD-10-CM | POA: Diagnosis not present

## 2015-04-03 DIAGNOSIS — Z792 Long term (current) use of antibiotics: Secondary | ICD-10-CM | POA: Diagnosis not present

## 2015-04-03 DIAGNOSIS — B9789 Other viral agents as the cause of diseases classified elsewhere: Secondary | ICD-10-CM | POA: Diagnosis not present

## 2015-04-03 DIAGNOSIS — N184 Chronic kidney disease, stage 4 (severe): Secondary | ICD-10-CM | POA: Diagnosis not present

## 2015-04-03 DIAGNOSIS — R197 Diarrhea, unspecified: Secondary | ICD-10-CM | POA: Diagnosis not present

## 2015-04-03 DIAGNOSIS — E119 Type 2 diabetes mellitus without complications: Secondary | ICD-10-CM | POA: Diagnosis not present

## 2015-04-03 DIAGNOSIS — N189 Chronic kidney disease, unspecified: Secondary | ICD-10-CM | POA: Diagnosis not present

## 2015-04-03 DIAGNOSIS — D631 Anemia in chronic kidney disease: Secondary | ICD-10-CM | POA: Diagnosis not present

## 2015-04-04 DIAGNOSIS — Z940 Kidney transplant status: Secondary | ICD-10-CM | POA: Diagnosis not present

## 2015-04-04 DIAGNOSIS — Z79899 Other long term (current) drug therapy: Secondary | ICD-10-CM | POA: Diagnosis not present

## 2015-04-06 DIAGNOSIS — I10 Essential (primary) hypertension: Secondary | ICD-10-CM | POA: Diagnosis not present

## 2015-04-06 DIAGNOSIS — Z940 Kidney transplant status: Secondary | ICD-10-CM | POA: Diagnosis not present

## 2015-04-06 DIAGNOSIS — Z79899 Other long term (current) drug therapy: Secondary | ICD-10-CM | POA: Diagnosis not present

## 2015-04-06 DIAGNOSIS — E119 Type 2 diabetes mellitus without complications: Secondary | ICD-10-CM | POA: Diagnosis not present

## 2015-04-06 DIAGNOSIS — M109 Gout, unspecified: Secondary | ICD-10-CM | POA: Diagnosis not present

## 2015-04-10 DIAGNOSIS — Z940 Kidney transplant status: Secondary | ICD-10-CM | POA: Diagnosis not present

## 2015-04-10 DIAGNOSIS — I10 Essential (primary) hypertension: Secondary | ICD-10-CM | POA: Diagnosis not present

## 2015-04-10 DIAGNOSIS — E1165 Type 2 diabetes mellitus with hyperglycemia: Secondary | ICD-10-CM | POA: Diagnosis not present

## 2015-04-10 DIAGNOSIS — E119 Type 2 diabetes mellitus without complications: Secondary | ICD-10-CM | POA: Diagnosis not present

## 2015-04-10 DIAGNOSIS — Z79899 Other long term (current) drug therapy: Secondary | ICD-10-CM | POA: Diagnosis not present

## 2015-04-13 DIAGNOSIS — M109 Gout, unspecified: Secondary | ICD-10-CM | POA: Diagnosis not present

## 2015-04-13 DIAGNOSIS — E785 Hyperlipidemia, unspecified: Secondary | ICD-10-CM | POA: Diagnosis not present

## 2015-04-13 DIAGNOSIS — I10 Essential (primary) hypertension: Secondary | ICD-10-CM | POA: Diagnosis not present

## 2015-04-13 DIAGNOSIS — Z940 Kidney transplant status: Secondary | ICD-10-CM | POA: Diagnosis not present

## 2015-04-13 DIAGNOSIS — D631 Anemia in chronic kidney disease: Secondary | ICD-10-CM | POA: Diagnosis not present

## 2015-04-13 DIAGNOSIS — E119 Type 2 diabetes mellitus without complications: Secondary | ICD-10-CM | POA: Diagnosis not present

## 2015-04-13 DIAGNOSIS — Z79899 Other long term (current) drug therapy: Secondary | ICD-10-CM | POA: Diagnosis not present

## 2015-04-13 DIAGNOSIS — N184 Chronic kidney disease, stage 4 (severe): Secondary | ICD-10-CM | POA: Diagnosis not present

## 2015-04-16 ENCOUNTER — Telehealth: Payer: Self-pay | Admitting: Nurse Practitioner

## 2015-04-16 ENCOUNTER — Ambulatory Visit: Payer: Self-pay | Admitting: Nurse Practitioner

## 2015-04-16 ENCOUNTER — Encounter: Payer: Self-pay | Admitting: Nurse Practitioner

## 2015-04-16 NOTE — Telephone Encounter (Signed)
Noted  

## 2015-04-16 NOTE — Telephone Encounter (Signed)
PATIENT WAS A NO SHOW AND LETTER SENT  °

## 2015-04-18 DIAGNOSIS — N189 Chronic kidney disease, unspecified: Secondary | ICD-10-CM | POA: Diagnosis not present

## 2015-04-18 DIAGNOSIS — Z940 Kidney transplant status: Secondary | ICD-10-CM | POA: Diagnosis not present

## 2015-04-18 DIAGNOSIS — I10 Essential (primary) hypertension: Secondary | ICD-10-CM | POA: Diagnosis not present

## 2015-04-18 DIAGNOSIS — Z79899 Other long term (current) drug therapy: Secondary | ICD-10-CM | POA: Diagnosis not present

## 2015-04-18 DIAGNOSIS — N250 Renal osteodystrophy: Secondary | ICD-10-CM | POA: Diagnosis not present

## 2015-04-18 DIAGNOSIS — E559 Vitamin D deficiency, unspecified: Secondary | ICD-10-CM | POA: Diagnosis not present

## 2015-04-18 DIAGNOSIS — E119 Type 2 diabetes mellitus without complications: Secondary | ICD-10-CM | POA: Diagnosis not present

## 2015-04-25 DIAGNOSIS — E119 Type 2 diabetes mellitus without complications: Secondary | ICD-10-CM | POA: Diagnosis not present

## 2015-04-25 DIAGNOSIS — N184 Chronic kidney disease, stage 4 (severe): Secondary | ICD-10-CM | POA: Diagnosis not present

## 2015-04-25 DIAGNOSIS — M109 Gout, unspecified: Secondary | ICD-10-CM | POA: Diagnosis not present

## 2015-04-25 DIAGNOSIS — I10 Essential (primary) hypertension: Secondary | ICD-10-CM | POA: Diagnosis not present

## 2015-04-25 DIAGNOSIS — D631 Anemia in chronic kidney disease: Secondary | ICD-10-CM | POA: Diagnosis not present

## 2015-04-25 DIAGNOSIS — B9789 Other viral agents as the cause of diseases classified elsewhere: Secondary | ICD-10-CM | POA: Diagnosis not present

## 2015-04-25 DIAGNOSIS — Z940 Kidney transplant status: Secondary | ICD-10-CM | POA: Diagnosis not present

## 2015-04-30 DIAGNOSIS — N3289 Other specified disorders of bladder: Secondary | ICD-10-CM | POA: Diagnosis not present

## 2015-04-30 DIAGNOSIS — Z940 Kidney transplant status: Secondary | ICD-10-CM | POA: Diagnosis not present

## 2015-04-30 DIAGNOSIS — R944 Abnormal results of kidney function studies: Secondary | ICD-10-CM | POA: Diagnosis not present

## 2015-05-02 DIAGNOSIS — E113513 Type 2 diabetes mellitus with proliferative diabetic retinopathy with macular edema, bilateral: Secondary | ICD-10-CM | POA: Diagnosis not present

## 2015-05-02 DIAGNOSIS — H25013 Cortical age-related cataract, bilateral: Secondary | ICD-10-CM | POA: Diagnosis not present

## 2015-05-09 DIAGNOSIS — E119 Type 2 diabetes mellitus without complications: Secondary | ICD-10-CM | POA: Diagnosis not present

## 2015-05-09 DIAGNOSIS — D631 Anemia in chronic kidney disease: Secondary | ICD-10-CM | POA: Diagnosis not present

## 2015-05-09 DIAGNOSIS — M109 Gout, unspecified: Secondary | ICD-10-CM | POA: Diagnosis not present

## 2015-05-09 DIAGNOSIS — N184 Chronic kidney disease, stage 4 (severe): Secondary | ICD-10-CM | POA: Diagnosis not present

## 2015-05-09 DIAGNOSIS — Z79899 Other long term (current) drug therapy: Secondary | ICD-10-CM | POA: Diagnosis not present

## 2015-05-09 DIAGNOSIS — N250 Renal osteodystrophy: Secondary | ICD-10-CM | POA: Diagnosis not present

## 2015-05-09 DIAGNOSIS — I10 Essential (primary) hypertension: Secondary | ICD-10-CM | POA: Diagnosis not present

## 2015-05-09 DIAGNOSIS — Z940 Kidney transplant status: Secondary | ICD-10-CM | POA: Diagnosis not present

## 2015-05-09 DIAGNOSIS — N189 Chronic kidney disease, unspecified: Secondary | ICD-10-CM | POA: Diagnosis not present

## 2015-05-09 DIAGNOSIS — E785 Hyperlipidemia, unspecified: Secondary | ICD-10-CM | POA: Diagnosis not present

## 2015-05-14 DIAGNOSIS — Z79899 Other long term (current) drug therapy: Secondary | ICD-10-CM | POA: Diagnosis not present

## 2015-05-14 DIAGNOSIS — Z940 Kidney transplant status: Secondary | ICD-10-CM | POA: Diagnosis not present

## 2015-05-24 DIAGNOSIS — Z79899 Other long term (current) drug therapy: Secondary | ICD-10-CM | POA: Diagnosis not present

## 2015-05-24 DIAGNOSIS — Z940 Kidney transplant status: Secondary | ICD-10-CM | POA: Diagnosis not present

## 2015-05-29 DIAGNOSIS — Z940 Kidney transplant status: Secondary | ICD-10-CM | POA: Diagnosis not present

## 2015-05-29 DIAGNOSIS — Z79899 Other long term (current) drug therapy: Secondary | ICD-10-CM | POA: Diagnosis not present

## 2015-05-30 DIAGNOSIS — E113513 Type 2 diabetes mellitus with proliferative diabetic retinopathy with macular edema, bilateral: Secondary | ICD-10-CM | POA: Diagnosis not present

## 2015-05-30 DIAGNOSIS — H25013 Cortical age-related cataract, bilateral: Secondary | ICD-10-CM | POA: Diagnosis not present

## 2015-06-02 IMAGING — CR DG C-ARM 61-120 MIN
3 series · 15 of 30 positions shown · non-contrast
Comparison: None.

CLINICAL DATA: Lead placement

EXAM:
DG C-ARM 61-120 MIN

[Series 3: subtraction in fluoroscopy · 5 of 24 frames shown (1 of 3)]
[frame 3/24]
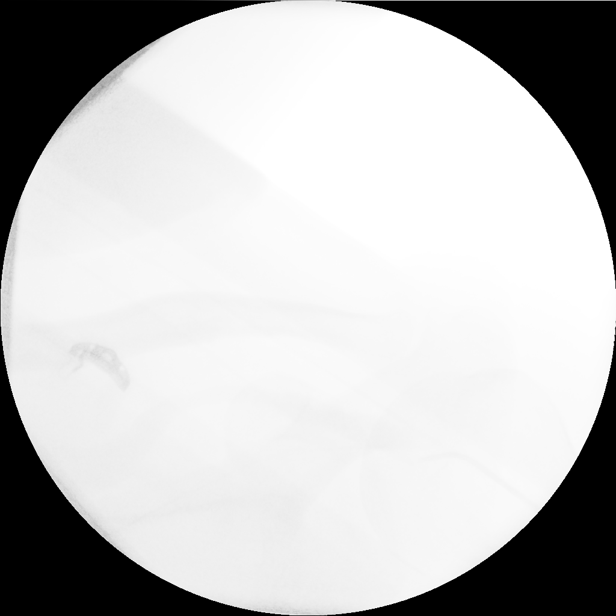
[frame 8/24]
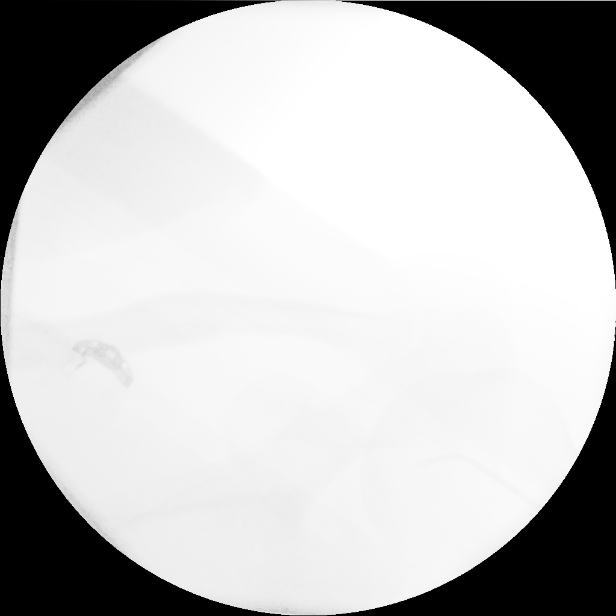
[frame 13/24]
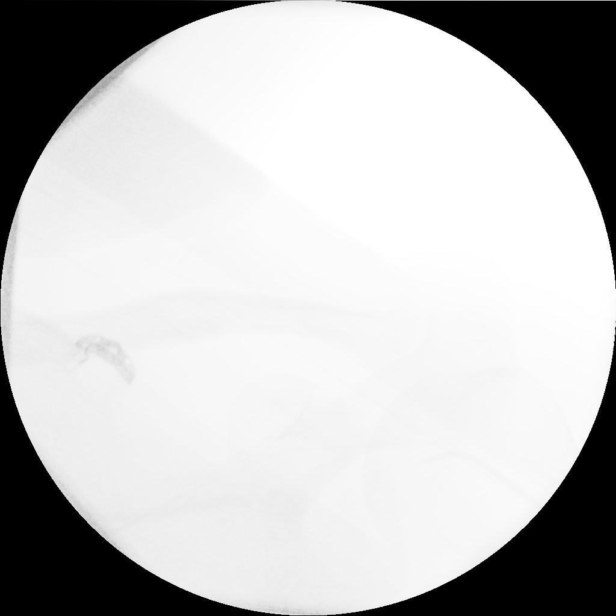
[frame 18/24]
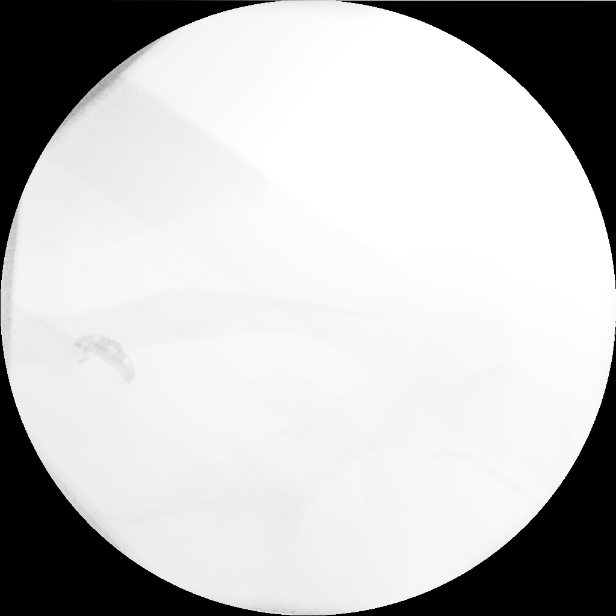
[frame 24/24]
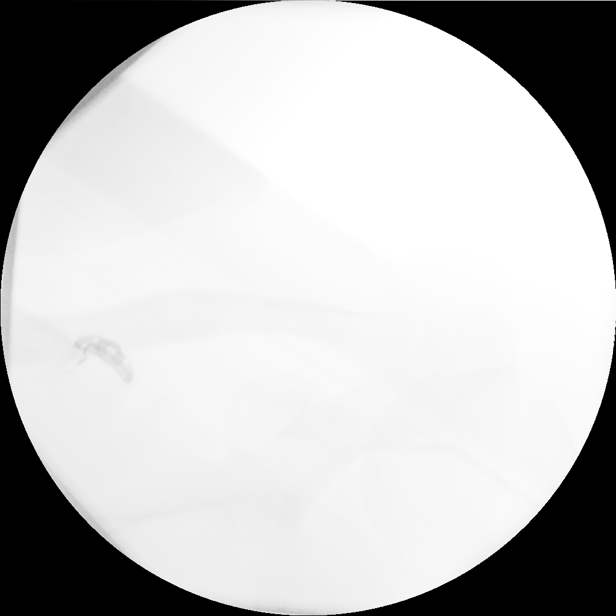

[Series 5: subtraction in fluoroscopy · 5 of 18 frames shown (2 of 3)]
[frame 2/18]
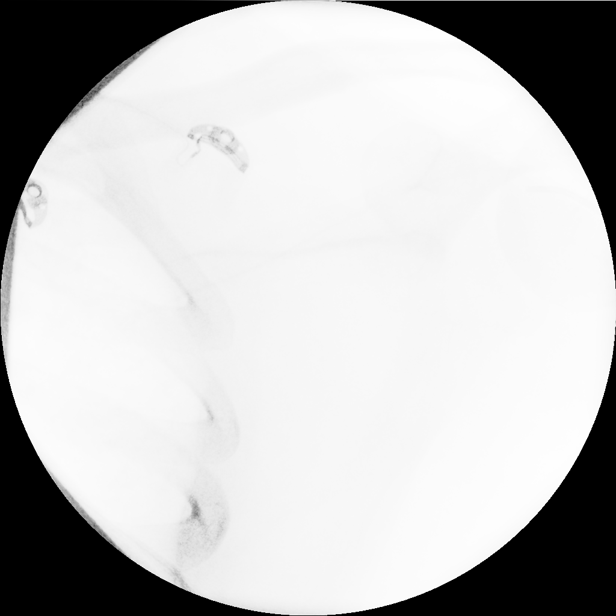
[frame 6/18]
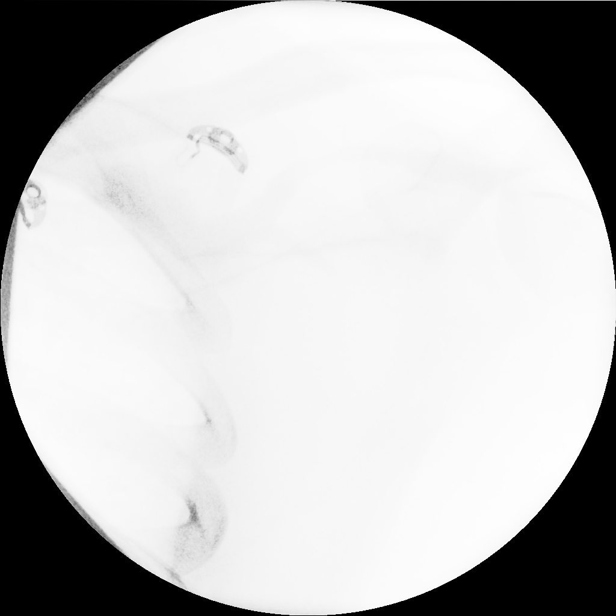
[frame 10/18]
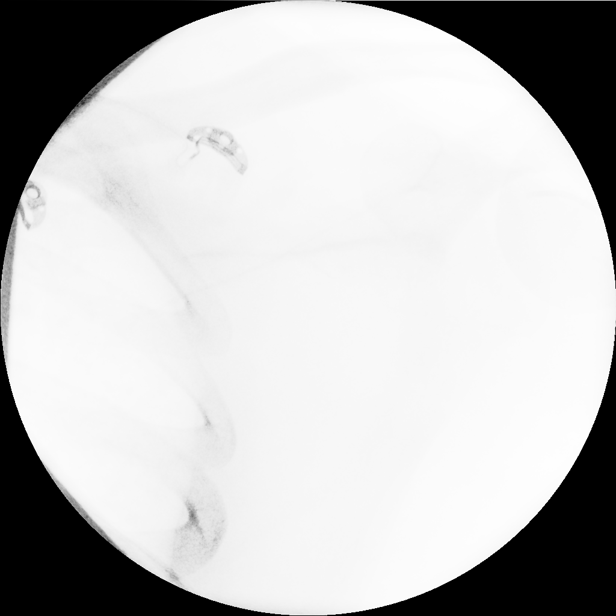
[frame 14/18]
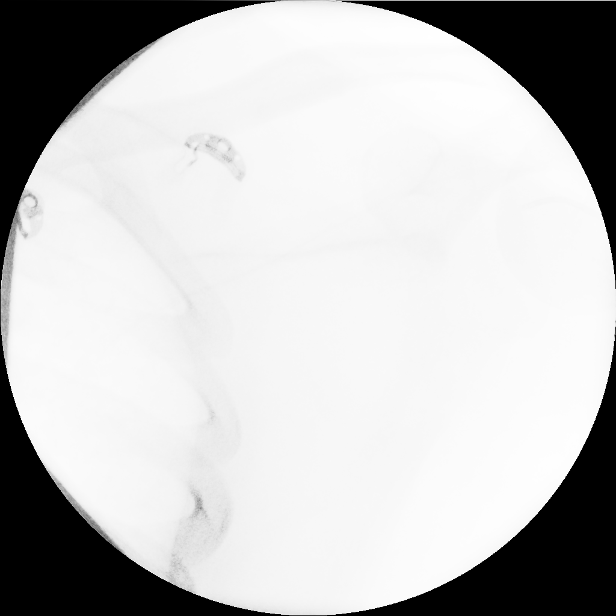
[frame 18/18]
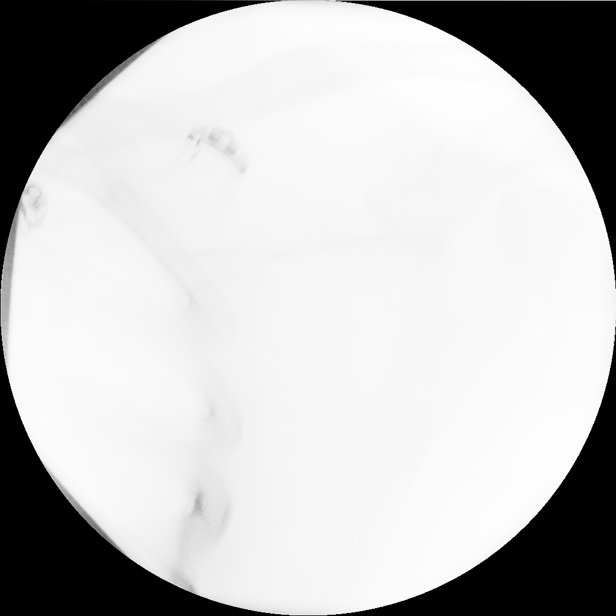

[Series 8: subtraction in fluoroscopy · 5 of 22 frames shown (3 of 3)]
[frame 3/22]
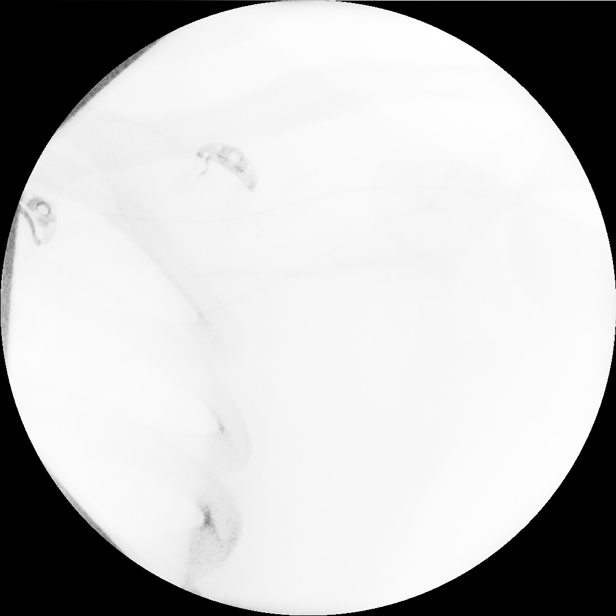
[frame 8/22]
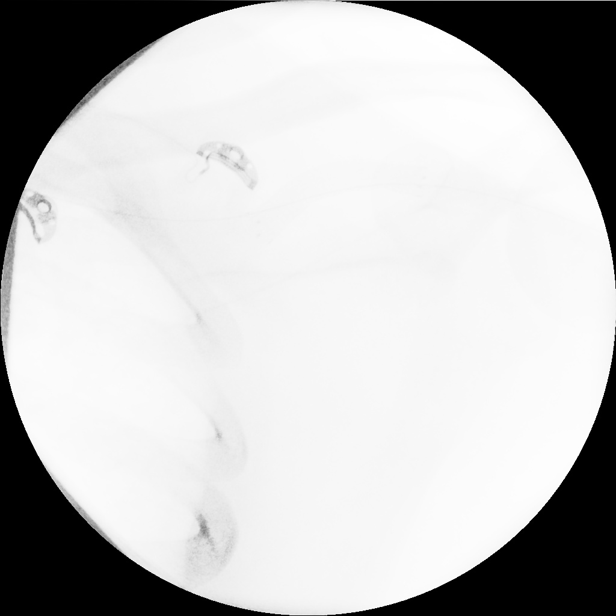
[frame 12/22]
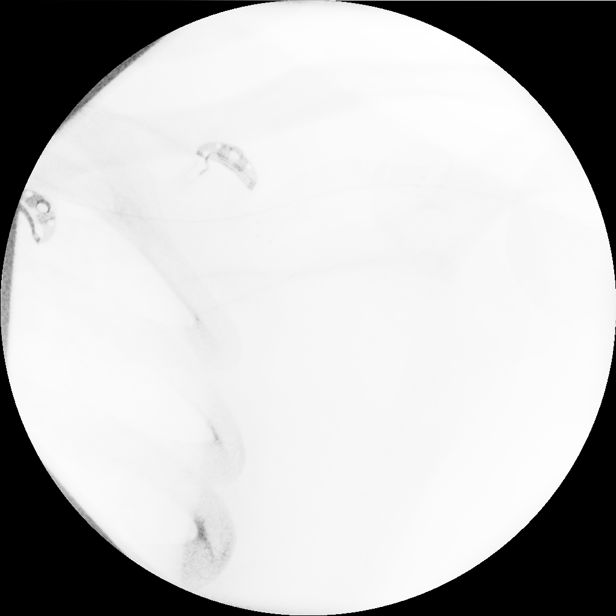
[frame 17/22]
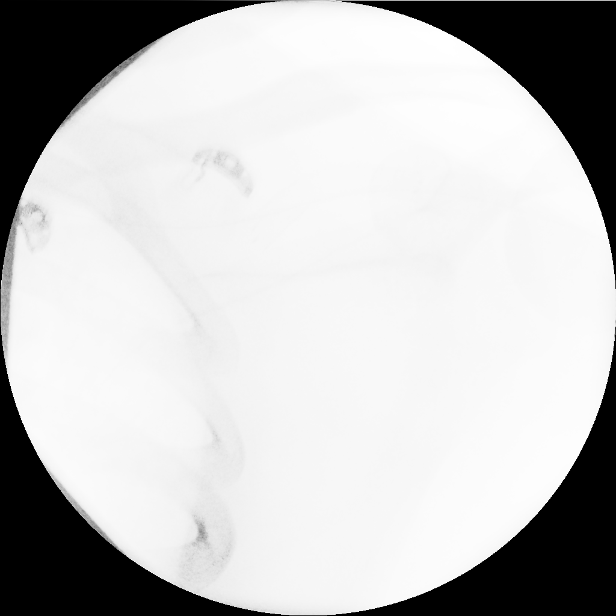
[frame 22/22]
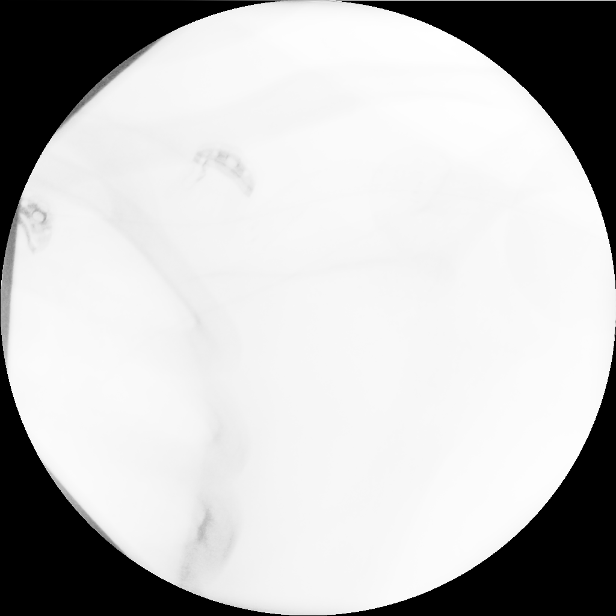

[15 of 30 positions shown; findings below may reference images not displayed]

FINDINGS: Intraoperative fluoroscopy was used for localization.
IMPRESSION: Intra operative localization.

## 2015-06-04 DIAGNOSIS — M109 Gout, unspecified: Secondary | ICD-10-CM | POA: Diagnosis not present

## 2015-06-04 DIAGNOSIS — E559 Vitamin D deficiency, unspecified: Secondary | ICD-10-CM | POA: Diagnosis not present

## 2015-06-04 DIAGNOSIS — N250 Renal osteodystrophy: Secondary | ICD-10-CM | POA: Diagnosis not present

## 2015-06-04 DIAGNOSIS — B9789 Other viral agents as the cause of diseases classified elsewhere: Secondary | ICD-10-CM | POA: Diagnosis not present

## 2015-06-04 DIAGNOSIS — E119 Type 2 diabetes mellitus without complications: Secondary | ICD-10-CM | POA: Diagnosis not present

## 2015-06-04 DIAGNOSIS — I10 Essential (primary) hypertension: Secondary | ICD-10-CM | POA: Diagnosis not present

## 2015-06-04 DIAGNOSIS — Z79899 Other long term (current) drug therapy: Secondary | ICD-10-CM | POA: Diagnosis not present

## 2015-06-04 DIAGNOSIS — E785 Hyperlipidemia, unspecified: Secondary | ICD-10-CM | POA: Diagnosis not present

## 2015-06-04 DIAGNOSIS — N184 Chronic kidney disease, stage 4 (severe): Secondary | ICD-10-CM | POA: Diagnosis not present

## 2015-06-04 DIAGNOSIS — Z940 Kidney transplant status: Secondary | ICD-10-CM | POA: Diagnosis not present

## 2015-06-11 DIAGNOSIS — E119 Type 2 diabetes mellitus without complications: Secondary | ICD-10-CM | POA: Diagnosis not present

## 2015-06-11 DIAGNOSIS — E559 Vitamin D deficiency, unspecified: Secondary | ICD-10-CM | POA: Diagnosis not present

## 2015-06-11 DIAGNOSIS — N250 Renal osteodystrophy: Secondary | ICD-10-CM | POA: Diagnosis not present

## 2015-06-11 DIAGNOSIS — Z79899 Other long term (current) drug therapy: Secondary | ICD-10-CM | POA: Diagnosis not present

## 2015-06-11 DIAGNOSIS — Z940 Kidney transplant status: Secondary | ICD-10-CM | POA: Diagnosis not present

## 2015-06-11 DIAGNOSIS — E785 Hyperlipidemia, unspecified: Secondary | ICD-10-CM | POA: Diagnosis not present

## 2015-06-11 DIAGNOSIS — M109 Gout, unspecified: Secondary | ICD-10-CM | POA: Diagnosis not present

## 2015-06-11 DIAGNOSIS — I10 Essential (primary) hypertension: Secondary | ICD-10-CM | POA: Diagnosis not present

## 2015-06-13 ENCOUNTER — Ambulatory Visit: Payer: Self-pay | Admitting: Endocrinology

## 2015-06-15 ENCOUNTER — Other Ambulatory Visit (HOSPITAL_COMMUNITY): Payer: Self-pay | Admitting: Internal Medicine

## 2015-06-15 DIAGNOSIS — I999 Unspecified disorder of circulatory system: Secondary | ICD-10-CM

## 2015-06-18 ENCOUNTER — Ambulatory Visit (HOSPITAL_COMMUNITY): Admission: RE | Admit: 2015-06-18 | Payer: Self-pay | Source: Ambulatory Visit

## 2015-06-19 ENCOUNTER — Other Ambulatory Visit (HOSPITAL_COMMUNITY): Payer: Self-pay | Admitting: Internal Medicine

## 2015-06-19 DIAGNOSIS — M84375A Stress fracture, left foot, initial encounter for fracture: Secondary | ICD-10-CM | POA: Diagnosis not present

## 2015-06-19 DIAGNOSIS — I999 Unspecified disorder of circulatory system: Secondary | ICD-10-CM

## 2015-06-19 DIAGNOSIS — Z940 Kidney transplant status: Secondary | ICD-10-CM | POA: Diagnosis not present

## 2015-06-19 DIAGNOSIS — M1990 Unspecified osteoarthritis, unspecified site: Secondary | ICD-10-CM | POA: Diagnosis not present

## 2015-06-19 DIAGNOSIS — Z79899 Other long term (current) drug therapy: Secondary | ICD-10-CM | POA: Diagnosis not present

## 2015-06-19 DIAGNOSIS — M2580 Other specified joint disorders, unspecified joint: Secondary | ICD-10-CM | POA: Diagnosis not present

## 2015-06-25 ENCOUNTER — Ambulatory Visit (HOSPITAL_COMMUNITY)
Admission: RE | Admit: 2015-06-25 | Discharge: 2015-06-25 | Disposition: A | Discharge status: Home / Self Care | Payer: Medicare Other | Source: Ambulatory Visit | Attending: Internal Medicine | Admitting: Internal Medicine

## 2015-06-25 DIAGNOSIS — Z0389 Encounter for observation for other suspected diseases and conditions ruled out: Secondary | ICD-10-CM | POA: Diagnosis not present

## 2015-06-25 DIAGNOSIS — Z940 Kidney transplant status: Secondary | ICD-10-CM | POA: Diagnosis not present

## 2015-06-25 DIAGNOSIS — I999 Unspecified disorder of circulatory system: Secondary | ICD-10-CM | POA: Diagnosis not present

## 2015-06-25 DIAGNOSIS — M84375D Stress fracture, left foot, subsequent encounter for fracture with routine healing: Secondary | ICD-10-CM | POA: Diagnosis not present

## 2015-06-25 DIAGNOSIS — Z79899 Other long term (current) drug therapy: Secondary | ICD-10-CM | POA: Diagnosis not present

## 2015-06-27 DIAGNOSIS — E113513 Type 2 diabetes mellitus with proliferative diabetic retinopathy with macular edema, bilateral: Secondary | ICD-10-CM | POA: Diagnosis not present

## 2015-07-03 DIAGNOSIS — Z79899 Other long term (current) drug therapy: Secondary | ICD-10-CM | POA: Diagnosis not present

## 2015-07-03 DIAGNOSIS — Z940 Kidney transplant status: Secondary | ICD-10-CM | POA: Diagnosis not present

## 2015-07-09 DIAGNOSIS — E119 Type 2 diabetes mellitus without complications: Secondary | ICD-10-CM | POA: Diagnosis not present

## 2015-07-09 DIAGNOSIS — Z79899 Other long term (current) drug therapy: Secondary | ICD-10-CM | POA: Diagnosis not present

## 2015-07-09 DIAGNOSIS — B9789 Other viral agents as the cause of diseases classified elsewhere: Secondary | ICD-10-CM | POA: Diagnosis not present

## 2015-07-09 DIAGNOSIS — Z940 Kidney transplant status: Secondary | ICD-10-CM | POA: Diagnosis not present

## 2015-07-09 DIAGNOSIS — I10 Essential (primary) hypertension: Secondary | ICD-10-CM | POA: Diagnosis not present

## 2015-07-09 DIAGNOSIS — N189 Chronic kidney disease, unspecified: Secondary | ICD-10-CM | POA: Diagnosis not present

## 2015-07-09 DIAGNOSIS — E785 Hyperlipidemia, unspecified: Secondary | ICD-10-CM | POA: Diagnosis not present

## 2015-07-09 DIAGNOSIS — N184 Chronic kidney disease, stage 4 (severe): Secondary | ICD-10-CM | POA: Diagnosis not present

## 2015-07-09 DIAGNOSIS — D631 Anemia in chronic kidney disease: Secondary | ICD-10-CM | POA: Diagnosis not present

## 2015-07-10 DIAGNOSIS — B351 Tinea unguium: Secondary | ICD-10-CM | POA: Diagnosis not present

## 2015-07-10 DIAGNOSIS — M84375D Stress fracture, left foot, subsequent encounter for fracture with routine healing: Secondary | ICD-10-CM | POA: Diagnosis not present

## 2015-07-17 DIAGNOSIS — Z940 Kidney transplant status: Secondary | ICD-10-CM | POA: Diagnosis not present

## 2015-07-17 DIAGNOSIS — Z79899 Other long term (current) drug therapy: Secondary | ICD-10-CM | POA: Diagnosis not present

## 2015-07-23 DIAGNOSIS — Z79899 Other long term (current) drug therapy: Secondary | ICD-10-CM | POA: Diagnosis not present

## 2015-07-23 DIAGNOSIS — Z940 Kidney transplant status: Secondary | ICD-10-CM | POA: Diagnosis not present

## 2015-07-26 DIAGNOSIS — B9789 Other viral agents as the cause of diseases classified elsewhere: Secondary | ICD-10-CM | POA: Diagnosis not present

## 2015-07-26 DIAGNOSIS — E119 Type 2 diabetes mellitus without complications: Secondary | ICD-10-CM | POA: Diagnosis not present

## 2015-07-26 DIAGNOSIS — Z79899 Other long term (current) drug therapy: Secondary | ICD-10-CM | POA: Diagnosis not present

## 2015-07-26 DIAGNOSIS — R609 Edema, unspecified: Secondary | ICD-10-CM | POA: Diagnosis not present

## 2015-07-26 DIAGNOSIS — I10 Essential (primary) hypertension: Secondary | ICD-10-CM | POA: Diagnosis not present

## 2015-07-26 DIAGNOSIS — Z940 Kidney transplant status: Secondary | ICD-10-CM | POA: Diagnosis not present

## 2015-07-31 DIAGNOSIS — E119 Type 2 diabetes mellitus without complications: Secondary | ICD-10-CM | POA: Diagnosis not present

## 2015-07-31 DIAGNOSIS — B9789 Other viral agents as the cause of diseases classified elsewhere: Secondary | ICD-10-CM | POA: Diagnosis not present

## 2015-07-31 DIAGNOSIS — E785 Hyperlipidemia, unspecified: Secondary | ICD-10-CM | POA: Diagnosis not present

## 2015-07-31 DIAGNOSIS — N184 Chronic kidney disease, stage 4 (severe): Secondary | ICD-10-CM | POA: Diagnosis not present

## 2015-07-31 DIAGNOSIS — Z940 Kidney transplant status: Secondary | ICD-10-CM | POA: Diagnosis not present

## 2015-07-31 DIAGNOSIS — D631 Anemia in chronic kidney disease: Secondary | ICD-10-CM | POA: Diagnosis not present

## 2015-07-31 DIAGNOSIS — Z79899 Other long term (current) drug therapy: Secondary | ICD-10-CM | POA: Diagnosis not present

## 2015-08-07 DIAGNOSIS — Z940 Kidney transplant status: Secondary | ICD-10-CM | POA: Diagnosis not present

## 2015-08-07 DIAGNOSIS — Z79899 Other long term (current) drug therapy: Secondary | ICD-10-CM | POA: Diagnosis not present

## 2015-08-23 DIAGNOSIS — Z940 Kidney transplant status: Secondary | ICD-10-CM | POA: Diagnosis not present

## 2015-08-23 DIAGNOSIS — E119 Type 2 diabetes mellitus without complications: Secondary | ICD-10-CM | POA: Diagnosis not present

## 2015-08-23 DIAGNOSIS — E1165 Type 2 diabetes mellitus with hyperglycemia: Secondary | ICD-10-CM | POA: Diagnosis not present

## 2015-08-23 DIAGNOSIS — Z79899 Other long term (current) drug therapy: Secondary | ICD-10-CM | POA: Diagnosis not present

## 2015-08-28 DIAGNOSIS — Z87448 Personal history of other diseases of urinary system: Secondary | ICD-10-CM | POA: Diagnosis not present

## 2015-08-28 DIAGNOSIS — E782 Mixed hyperlipidemia: Secondary | ICD-10-CM | POA: Diagnosis not present

## 2015-08-28 DIAGNOSIS — Z940 Kidney transplant status: Secondary | ICD-10-CM | POA: Diagnosis not present

## 2015-08-28 DIAGNOSIS — Z79899 Other long term (current) drug therapy: Secondary | ICD-10-CM | POA: Diagnosis not present

## 2015-08-28 DIAGNOSIS — E1165 Type 2 diabetes mellitus with hyperglycemia: Secondary | ICD-10-CM | POA: Diagnosis not present

## 2015-08-28 DIAGNOSIS — I10 Essential (primary) hypertension: Secondary | ICD-10-CM | POA: Diagnosis not present

## 2015-09-04 DIAGNOSIS — N250 Renal osteodystrophy: Secondary | ICD-10-CM | POA: Diagnosis not present

## 2015-09-04 DIAGNOSIS — E119 Type 2 diabetes mellitus without complications: Secondary | ICD-10-CM | POA: Diagnosis not present

## 2015-09-04 DIAGNOSIS — E872 Acidosis: Secondary | ICD-10-CM | POA: Diagnosis not present

## 2015-09-04 DIAGNOSIS — M109 Gout, unspecified: Secondary | ICD-10-CM | POA: Diagnosis not present

## 2015-09-04 DIAGNOSIS — I10 Essential (primary) hypertension: Secondary | ICD-10-CM | POA: Diagnosis not present

## 2015-09-04 DIAGNOSIS — N184 Chronic kidney disease, stage 4 (severe): Secondary | ICD-10-CM | POA: Diagnosis not present

## 2015-09-04 DIAGNOSIS — D631 Anemia in chronic kidney disease: Secondary | ICD-10-CM | POA: Diagnosis not present

## 2015-09-04 DIAGNOSIS — E785 Hyperlipidemia, unspecified: Secondary | ICD-10-CM | POA: Diagnosis not present

## 2015-09-04 DIAGNOSIS — Z792 Long term (current) use of antibiotics: Secondary | ICD-10-CM | POA: Diagnosis not present

## 2015-09-04 DIAGNOSIS — Z79899 Other long term (current) drug therapy: Secondary | ICD-10-CM | POA: Diagnosis not present

## 2015-09-04 DIAGNOSIS — Z940 Kidney transplant status: Secondary | ICD-10-CM | POA: Diagnosis not present

## 2015-09-10 DIAGNOSIS — E119 Type 2 diabetes mellitus without complications: Secondary | ICD-10-CM | POA: Diagnosis not present

## 2015-09-10 DIAGNOSIS — Z79899 Other long term (current) drug therapy: Secondary | ICD-10-CM | POA: Diagnosis not present

## 2015-09-10 DIAGNOSIS — Z940 Kidney transplant status: Secondary | ICD-10-CM | POA: Diagnosis not present

## 2015-09-10 DIAGNOSIS — M109 Gout, unspecified: Secondary | ICD-10-CM | POA: Diagnosis not present

## 2015-09-10 DIAGNOSIS — E785 Hyperlipidemia, unspecified: Secondary | ICD-10-CM | POA: Diagnosis not present

## 2015-09-10 DIAGNOSIS — N184 Chronic kidney disease, stage 4 (severe): Secondary | ICD-10-CM | POA: Diagnosis not present

## 2015-09-11 DIAGNOSIS — E113513 Type 2 diabetes mellitus with proliferative diabetic retinopathy with macular edema, bilateral: Secondary | ICD-10-CM | POA: Diagnosis not present

## 2015-09-27 DIAGNOSIS — Z940 Kidney transplant status: Secondary | ICD-10-CM | POA: Diagnosis not present

## 2015-09-27 DIAGNOSIS — Z79899 Other long term (current) drug therapy: Secondary | ICD-10-CM | POA: Diagnosis not present

## 2015-10-25 DIAGNOSIS — L97511 Non-pressure chronic ulcer of other part of right foot limited to breakdown of skin: Secondary | ICD-10-CM | POA: Diagnosis not present

## 2015-11-05 DIAGNOSIS — L97511 Non-pressure chronic ulcer of other part of right foot limited to breakdown of skin: Secondary | ICD-10-CM | POA: Diagnosis not present

## 2015-11-12 DIAGNOSIS — L97511 Non-pressure chronic ulcer of other part of right foot limited to breakdown of skin: Secondary | ICD-10-CM | POA: Diagnosis not present

## 2015-12-10 ENCOUNTER — Encounter (HOSPITAL_COMMUNITY): Payer: Self-pay | Admitting: *Deleted

## 2015-12-10 ENCOUNTER — Observation Stay (HOSPITAL_COMMUNITY)
Admission: EM | Admit: 2015-12-10 | Discharge: 2015-12-11 | Disposition: A | Discharge status: Home / Self Care | Payer: BLUE CROSS/BLUE SHIELD | Attending: Emergency Medicine | Admitting: Emergency Medicine

## 2015-12-10 ENCOUNTER — Observation Stay (HOSPITAL_COMMUNITY): Payer: BLUE CROSS/BLUE SHIELD

## 2015-12-10 DIAGNOSIS — D849 Immunodeficiency, unspecified: Secondary | ICD-10-CM

## 2015-12-10 DIAGNOSIS — I10 Essential (primary) hypertension: Secondary | ICD-10-CM | POA: Diagnosis present

## 2015-12-10 DIAGNOSIS — D61818 Other pancytopenia: Secondary | ICD-10-CM | POA: Diagnosis present

## 2015-12-10 DIAGNOSIS — N189 Chronic kidney disease, unspecified: Secondary | ICD-10-CM | POA: Insufficient documentation

## 2015-12-10 DIAGNOSIS — R112 Nausea with vomiting, unspecified: Secondary | ICD-10-CM | POA: Diagnosis not present

## 2015-12-10 DIAGNOSIS — E119 Type 2 diabetes mellitus without complications: Secondary | ICD-10-CM

## 2015-12-10 DIAGNOSIS — E1165 Type 2 diabetes mellitus with hyperglycemia: Secondary | ICD-10-CM

## 2015-12-10 DIAGNOSIS — N179 Acute kidney failure, unspecified: Secondary | ICD-10-CM | POA: Diagnosis not present

## 2015-12-10 DIAGNOSIS — R197 Diarrhea, unspecified: Secondary | ICD-10-CM

## 2015-12-10 DIAGNOSIS — I129 Hypertensive chronic kidney disease with stage 1 through stage 4 chronic kidney disease, or unspecified chronic kidney disease: Secondary | ICD-10-CM | POA: Diagnosis not present

## 2015-12-10 DIAGNOSIS — E1122 Type 2 diabetes mellitus with diabetic chronic kidney disease: Secondary | ICD-10-CM | POA: Diagnosis not present

## 2015-12-10 DIAGNOSIS — E860 Dehydration: Secondary | ICD-10-CM | POA: Insufficient documentation

## 2015-12-10 DIAGNOSIS — D899 Disorder involving the immune mechanism, unspecified: Secondary | ICD-10-CM | POA: Diagnosis not present

## 2015-12-10 DIAGNOSIS — Z940 Kidney transplant status: Secondary | ICD-10-CM | POA: Diagnosis not present

## 2015-12-10 DIAGNOSIS — N186 End stage renal disease: Secondary | ICD-10-CM

## 2015-12-10 DIAGNOSIS — N1832 Type 2 diabetes mellitus with diabetic chronic kidney disease: Secondary | ICD-10-CM

## 2015-12-10 DIAGNOSIS — D649 Anemia, unspecified: Secondary | ICD-10-CM | POA: Diagnosis present

## 2015-12-10 HISTORY — DX: Nausea with vomiting, unspecified: R11.2

## 2015-12-10 HISTORY — DX: Nausea with vomiting, unspecified: R19.7

## 2015-12-10 LAB — URINALYSIS, ROUTINE W REFLEX MICROSCOPIC
GLUCOSE, UA: NEGATIVE mg/dL
Hgb urine dipstick: NEGATIVE
KETONES UR: 15 mg/dL — AB
LEUKOCYTES UA: NEGATIVE
Nitrite: NEGATIVE
PH: 5 (ref 5.0–8.0)
PROTEIN: NEGATIVE mg/dL
Specific Gravity, Urine: 1.026 (ref 1.005–1.030)

## 2015-12-10 LAB — COMPREHENSIVE METABOLIC PANEL
ALT: 26 U/L (ref 17–63)
AST: 21 U/L (ref 15–41)
Albumin: 4.2 g/dL (ref 3.5–5.0)
Alkaline Phosphatase: 184 U/L — ABNORMAL HIGH (ref 38–126)
Anion gap: 8 (ref 5–15)
BUN: 46 mg/dL — AB (ref 6–20)
CO2: 17 mmol/L — AB (ref 22–32)
CREATININE: 1.88 mg/dL — AB (ref 0.61–1.24)
Calcium: 9.7 mg/dL (ref 8.9–10.3)
Chloride: 113 mmol/L — ABNORMAL HIGH (ref 101–111)
GFR calc Af Amer: 45 mL/min — ABNORMAL LOW (ref >60)
GFR calc non Af Amer: 39 mL/min — ABNORMAL LOW (ref >60)
Glucose, Bld: 134 mg/dL — ABNORMAL HIGH (ref 65–99)
POTASSIUM: 4.9 mmol/L (ref 3.5–5.1)
Sodium: 138 mmol/L (ref 135–145)
Total Bilirubin: 0.6 mg/dL (ref 0.3–1.2)
Total Protein: 7.7 g/dL (ref 6.5–8.1)

## 2015-12-10 LAB — CBC
HCT: 37 % — ABNORMAL LOW (ref 39.0–52.0)
Hemoglobin: 11.4 g/dL — ABNORMAL LOW (ref 13.0–17.0)
MCH: 24.5 pg — ABNORMAL LOW (ref 26.0–34.0)
MCHC: 30.8 g/dL (ref 30.0–36.0)
MCV: 79.4 fL (ref 78.0–100.0)
PLATELETS: 142 10*3/uL — AB (ref 150–400)
RBC: 4.66 MIL/uL (ref 4.22–5.81)
RDW: 14.3 % (ref 11.5–15.5)
WBC: 2.9 10*3/uL — ABNORMAL LOW (ref 4.0–10.5)

## 2015-12-10 LAB — GLUCOSE, CAPILLARY: Glucose-Capillary: 117 mg/dL — ABNORMAL HIGH (ref 65–99)

## 2015-12-10 LAB — I-STAT CG4 LACTIC ACID, ED: Lactic Acid, Venous: 0.51 mmol/L (ref 0.5–1.9)

## 2015-12-10 LAB — LIPASE, BLOOD: Lipase: 13 U/L (ref 11–51)

## 2015-12-10 MED ORDER — SODIUM CHLORIDE 0.9 % IV BOLUS (SEPSIS)
1000.0000 mL | Freq: Once | INTRAVENOUS | Status: AC
  Administered 2015-12-10: 1000 mL via INTRAVENOUS

## 2015-12-10 MED ORDER — ONDANSETRON HCL 4 MG/2ML IJ SOLN
4.0000 mg | Freq: Four times a day (QID) | INTRAMUSCULAR | Status: DC | PRN
  Administered 2015-12-11: 4 mg via INTRAVENOUS
  Filled 2015-12-10: qty 2

## 2015-12-10 MED ORDER — CLONIDINE HCL 0.2 MG/24HR TD PTWK: 0.2000 mg | MEDICATED_PATCH | TRANSDERMAL | Status: DC

## 2015-12-10 MED ORDER — ONDANSETRON HCL 4 MG PO TABS: 4.0000 mg | ORAL_TABLET | Freq: Four times a day (QID) | ORAL | Status: DC | PRN

## 2015-12-10 MED ORDER — PRAVASTATIN SODIUM 20 MG PO TABS: 20.0000 mg | ORAL_TABLET | Freq: Every day | ORAL | Status: DC

## 2015-12-10 MED ORDER — ACETAMINOPHEN 325 MG PO TABS: 650.0000 mg | ORAL_TABLET | Freq: Four times a day (QID) | ORAL | Status: DC | PRN

## 2015-12-10 MED ORDER — AMLODIPINE BESYLATE 10 MG PO TABS: 10.0000 mg | ORAL_TABLET | Freq: Every day | ORAL | Status: DC

## 2015-12-10 MED ORDER — AMLODIPINE BESYLATE 5 MG PO TABS
5.0000 mg | ORAL_TABLET | Freq: Two times a day (BID) | ORAL | Status: DC
  Administered 2015-12-10 – 2015-12-11 (×2): 5 mg via ORAL
  Filled 2015-12-10 (×2): qty 1

## 2015-12-10 MED ORDER — SODIUM BICARBONATE 650 MG PO TABS
1300.0000 mg | ORAL_TABLET | Freq: Two times a day (BID) | ORAL | Status: DC
  Administered 2015-12-10 – 2015-12-11 (×2): 1300 mg via ORAL
  Filled 2015-12-10 (×2): qty 2

## 2015-12-10 MED ORDER — MYCOPHENOLATE SODIUM 180 MG PO TBEC
360.0000 mg | DELAYED_RELEASE_TABLET | Freq: Two times a day (BID) | ORAL | Status: DC
  Administered 2015-12-10 – 2015-12-11 (×2): 360 mg via ORAL
  Filled 2015-12-10 (×2): qty 2

## 2015-12-10 MED ORDER — TACROLIMUS 1 MG PO CAPS
2.0000 mg | ORAL_CAPSULE | Freq: Two times a day (BID) | ORAL | Status: DC
  Administered 2015-12-10 – 2015-12-11 (×2): 2 mg via ORAL
  Filled 2015-12-10 (×2): qty 2

## 2015-12-10 MED ORDER — CLONIDINE HCL 0.3 MG/24HR TD PTWK
0.3000 mg | MEDICATED_PATCH | TRANSDERMAL | Status: DC
  Filled 2015-12-10: qty 1

## 2015-12-10 MED ORDER — HEPARIN SODIUM (PORCINE) 5000 UNIT/ML IJ SOLN
5000.0000 [IU] | Freq: Three times a day (TID) | INTRAMUSCULAR | Status: DC
  Administered 2015-12-10 – 2015-12-11 (×2): 5000 [IU] via SUBCUTANEOUS
  Filled 2015-12-10 (×2): qty 1

## 2015-12-10 MED ORDER — SODIUM CHLORIDE 0.9 % IV SOLN
INTRAVENOUS | Status: DC
  Administered 2015-12-10 – 2015-12-11 (×2): via INTRAVENOUS

## 2015-12-10 MED ORDER — ACETAMINOPHEN 650 MG RE SUPP: 650.0000 mg | Freq: Four times a day (QID) | RECTAL | Status: DC | PRN

## 2015-12-10 MED ORDER — AMLODIPINE BESYLATE 5 MG PO TABS: 5.0000 mg | ORAL_TABLET | Freq: Every day | ORAL | Status: DC

## 2015-12-10 MED ORDER — TACROLIMUS 1 MG PO CAPS: 2.0000 mg | ORAL_CAPSULE | Freq: Every day | ORAL | Status: DC

## 2015-12-10 MED ORDER — ATORVASTATIN CALCIUM 40 MG PO TABS: 40.0000 mg | ORAL_TABLET | Freq: Every day | ORAL | Status: DC

## 2015-12-10 MED ORDER — INSULIN ASPART 100 UNIT/ML ~~LOC~~ SOLN
0.0000 [IU] | Freq: Three times a day (TID) | SUBCUTANEOUS | Status: DC
  Administered 2015-12-11: 1 [IU] via SUBCUTANEOUS

## 2015-12-10 NOTE — ED Notes (Signed)
Patient has had juice and crackers states has kept it down. States no nausea or vomiting since eating

## 2015-12-10 NOTE — ED Provider Notes (Signed)
I saw and evaluated the patient, reviewed the resident's note and I agree with the findings and plan.  Pertinent History: The patient is an end-stage renal failure patient who had previously been on dialysis but has since undergone a renal transplant in November 2016. He presents with worsening renal function after having some diarrhea this week. This is despite drinking plenty of fluids. He was referred here from his doctor after having worsening creatinine which was close to 2.0 today.  Pertinent Exam findings: On exam the patient has no abdominal tenderness, no peripheral edema, clear heart and lung sounds. His dialysis access is in the left upper extremity with a good thrill and pulse without redness or overlying induration. The patient's mucous members appear normal, he is in no distress and has clear speech.  The etiology of the patient's kidney failure is unclear though it could be related to volume depletion, he will be admitted to the hospital, multiple tests have been ordered including cultures to rule out sources of the patient's general decline and generalized weakness.   Final diagnoses:  Acute kidney injury ([place])  Immunosuppression ([place])  Dehydration      Noemi Chapel, MD 12/11/15 [phone]

## 2015-12-10 NOTE — ED Notes (Signed)
Patient transported to X-ray 

## 2015-12-10 NOTE — ED Notes (Signed)
Pt states he is 8 months post kidney transplant.  States he "hadn't been feeling well/vomiting".  Went to nephrologists office for regular check up and they told him to come here b/c he was dehydrated, that his creatinin was high.

## 2015-12-10 NOTE — ED Provider Notes (Signed)
CSN: [id number]     Arrival date & time 12/10/15  1332 History   First MD Initiated Contact with Patient 12/10/15 1756     Chief Complaint  Patient presents with  . Emesis  . Dehydration     (Consider location/radiation/quality/duration/timing/severity/associated sxs/prior Treatment) HPI Patient's a 55 year old male with 3 of kidney transplant 8 months ago on immunosuppression presenting for concern of dehydration. He was sent by the lab clinic that draws his blood every week to test his creatinine. Normally 1.3. Over the last week and a half he has woken up daily had approximately 3 AM with vomiting. He's also had multiple episodes of diarrhea per day. He endorses decreased by mouth intake due to the nausea and vomiting. He denies any fevers, chills, coughs, dysuria, flank pain, known sick contacts, sore throat, ear pain, new rashes, abdominal pain. He does endorse feeling generalized weakness. Past Medical History  Diagnosis Date  . Renal disorder   . Diabetes mellitus without complication ([place])   . Chronic kidney disease     Awaiting transplant  . Hypertension   . Diabetes [facility])    Past Surgical History  Procedure Laterality Date  . Colonoscopy N/A 12/05/2014    [id number] external and internal hemorrhoid/mild diverticulosis/11 polyps removed  . Esophagogastroduodenoscopy N/A 12/05/2014    [id number] duodenitis  . Kidney transplant  03/2015   Family History  Problem Relation Age of Onset  . Colon cancer Neg Hx    Social History  Substance Use Topics  . Smoking status: Never Smoker   . Smokeless tobacco: None  . Alcohol Use: No    Review of Systems  Constitutional: Positive for fatigue. Negative for fever, chills and diaphoresis.  HENT: Negative for congestion and ear pain.   Eyes: Negative for visual disturbance.  Respiratory: Negative for cough, chest tightness and shortness of breath.   Cardiovascular: Negative for chest pain.  Gastrointestinal: Positive for nausea,  vomiting and diarrhea. Negative for abdominal pain and blood in stool.  Genitourinary: Negative for dysuria and flank pain.  Musculoskeletal: Negative for neck pain.  Skin: Negative for rash.  Neurological: Negative for weakness, numbness and headaches.  Psychiatric/Behavioral: Negative for confusion and agitation.      Allergies  Review of patient's allergies indicates no known allergies.  Home Medications   See MAR  BP 155/70 mmHg  Pulse 59  Temp(Src) 98.2 F (36.8 C) (Oral)  Resp 16  SpO2 96% Physical Exam  Constitutional: He is oriented to person, place, and time. He appears well-developed and well-nourished. No distress.  HENT:  Head: Normocephalic and atraumatic.  Eyes: Conjunctivae are normal.  Cardiovascular: Normal rate and normal heart sounds.   No murmur heard. Pulmonary/Chest: Effort normal and breath sounds normal.  Abdominal: Soft. There is no tenderness.  Musculoskeletal: He exhibits no edema.  Neurological: He is alert and oriented to person, place, and time.  Skin: Skin is warm. He is not diaphoretic.  Psychiatric: He has a normal mood and affect. His behavior is normal.  Nursing note and vitals reviewed.   ED Course  Procedures (including critical care time) Labs Review Labs Reviewed  COMPREHENSIVE METABOLIC PANEL - Abnormal; Notable for the following:    Chloride 113 (*)    CO2 17 (*)    Glucose, Bld 134 (*)    BUN 46 (*)    Creatinine, Ser 1.88 (*)    Alkaline Phosphatase 184 (*)    GFR calc non Af Amer 39 (*)    GFR calc Af  Amer 45 (*)    All other components within normal limits  CBC - Abnormal; Notable for the following:    WBC 2.9 (*)    Hemoglobin 11.4 (*)    HCT 37.0 (*)    MCH 24.5 (*)    Platelets 142 (*)    All other components within normal limits  URINALYSIS, ROUTINE W REFLEX MICROSCOPIC (NOT AT [facility]) - Abnormal; Notable for the following:    Bilirubin Urine SMALL (*)    Ketones, ur 15 (*)    All other components within  normal limits  GLUCOSE, CAPILLARY - Abnormal; Notable for the following:    Glucose-Capillary 117 (*)    All other components within normal limits  CULTURE, BLOOD (ROUTINE X 2)  CULTURE, BLOOD (ROUTINE X 2)  URINE CULTURE  C DIFFICILE QUICK SCREEN W PCR REFLEX  GASTROINTESTINAL PANEL BY PCR, STOOL (REPLACES STOOL CULTURE)  LIPASE, BLOOD  CBC  COMPREHENSIVE METABOLIC PANEL  I-STAT CG4 LACTIC ACID, ED    Imaging Review Dg Chest 2 View  12/10/2015  CLINICAL DATA:  Weakness, dehydration, diabetic, 8 month status post kidney transplant EXAM: CHEST  2 VIEW COMPARISON:  03/13/2014 FINDINGS: Stable cardiomegaly without CHF or pneumonia. No collapse, consolidation, edema, effusion or pneumothorax. Trachea is midline. Left cephalic venous stent noted over the shoulder. Monitor leads overlie the chest. IMPRESSION: Stable cardiomegaly without acute process. Electronically Signed   By: Jerilynn Mages.  Shick M.D.   On: 12/10/2015 20:32   I have personally reviewed and evaluated these images and lab results as part of my medical decision-making.   EKG Interpretation None      MDM   Final diagnoses:  Acute kidney injury ([place])  Immunosuppression ([place])  Dehydration   Patient presents with concern of acute kidney injury in the setting of recent renal transplant and immunosuppression. He endorses multiple episodes of nausea, vomiting, and diarrhea daily over the last week and a half. Laboratory evaluation demonstrates prerenal acute kidney injury with creatinine 1.88 and BUN 4. No evidence of UTI. White blood cell count 0.9, hemoglobin 11.4. Blood cultures were obtained in the emergency department as well as chest x-ray and urine culture. Patient is otherwise well-appearing with normal vital signs. He was given a bolus of NS in the ED. He was admitted to hospitalist service for further workup and care of his acute kidney injury.     Allie Bossier, MD 12/11/15 0040  Noemi Chapel, MD 12/11/15 [phone]

## 2015-12-10 NOTE — Progress Notes (Signed)
Received report

## 2015-12-10 NOTE — H&P (Signed)
History and Physical    Kohle Biagiotti [id number] DOB: 1961/05/08 DOA: 12/10/2015  PCP: Rosita Fire, MD  Patient coming from: Home.  Chief Complaint: Elevated creatinine.  HPI: Jajuan Ewalt is a 55 y.o. male with renal transplant on immunosuppressants and Bactrim, hypertension, diabetes mellitus type 2 was advised to come to the ER by patient's transplant nephrologist at [facility] since patient's creatinine was elevated to 1.8. Patient's baseline creatinine as per the patient was around 1.3. Patient has been having increasing nausea vomiting diarrhea over the last 2 weeks. Denies any fever or chills or abdominal pain. Patient was given fluid bolus in the ER and admitted for further hydration. Patient in addition was advised to decrease his tacrolimus dose by patient's transplant nephrologist yesterday due to renal failure. On exam patient is not in distress. Denies any recent sick contacts or hospitalization. Denies any recent travel.  ED Course: Patient was given fluid bolus 1 L.  Review of Systems: As per HPI, rest all negative.   Past Medical History  Diagnosis Date  . Renal disorder   . Diabetes mellitus without complication ([place])   . Chronic kidney disease     Awaiting transplant  . Hypertension   . Diabetes [facility])     Past Surgical History  Procedure Laterality Date  . Colonoscopy N/A 12/05/2014    [id number] external and internal hemorrhoid/mild diverticulosis/11 polyps removed  . Esophagogastroduodenoscopy N/A 12/05/2014    [id number] duodenitis  . Kidney transplant  03/2015     reports that he has never smoked. He does not have any smokeless tobacco history on file. He reports that he does not drink alcohol or use illicit drugs.  No Known Allergies  Family History  Problem Relation Age of Onset  . Colon cancer Neg Hx     Prior to Admission medications   Medication Sig Start Date End Date Taking? Authorizing Provider  amLODipine (NORVASC) 10 MG tablet  Take 10 mg by mouth daily.    Historical Provider, MD  amLODipine (NORVASC) 5 MG tablet Take 5 mg by mouth daily.    Historical Provider, MD  B Complex-C-Folic Acid (DIALYVITE PO) Take 1 tablet by mouth daily.    Historical Provider, MD  calcium acetate (PHOSLO) 667 MG capsule Take 2,001-2,668 mg by mouth See admin instructions. Take 4 capsules with every meal and 3 capsules with every snack    Historical Provider, MD  cloNIDine (CATAPRES - DOSED IN MG/24 HR) 0.2 mg/24hr patch Place 0.2 mg onto the skin once a week. Pt states he is on 0.3 mg patch and changes every week    Historical Provider, MD  cloNIDine (CATAPRES) 0.2 MG tablet Take 0.2 mg by mouth 2 (two) times daily. Hold if heart rate is less than 55    Historical Provider, MD  doxazosin (CARDURA) 4 MG tablet Take 4 mg by mouth daily.    Historical Provider, MD  furosemide (LASIX) 40 MG tablet Take 40 mg by mouth 2 (two) times daily. Take 3 po daily    Historical Provider, MD  furosemide (LASIX) 80 MG tablet Take 80 mg by mouth daily. Take on non-dialysis days (Monday, Wednesday, and Friday)    Historical Provider, MD  glimepiride (AMARYL) 4 MG tablet Take 4 mg by mouth daily with breakfast.    Historical Provider, MD  lovastatin (MEVACOR) 20 MG tablet Take 20 mg by mouth at bedtime.    Historical Provider, MD  multivitamin (RENA-VIT) TABS tablet Take 1 tablet by mouth daily.  Historical Provider, MD  omeprazole (PRILOSEC) 20 MG capsule Take 1 capsule (20 mg total) by mouth daily. 11/15/14   Carlis Stable, NP  ondansetron (ZOFRAN ODT) 4 MG disintegrating tablet Take 1 tablet (4 mg total) by mouth every 8 (eight) hours as needed for nausea. 05/19/12   Fredia Sorrow, MD  ranitidine (ZANTAC) 75 MG tablet Take 75 mg by mouth daily as needed. For acid reflux    Historical Provider, MD  sevelamer carbonate (RENVELA) 800 MG tablet Take 800 mg by mouth 3 (three) times daily with meals.    Historical Provider, MD    Physical Exam: Filed Vitals:    12/10/15 1830 12/10/15 1845 12/10/15 1900 12/10/15 1915  BP: 135/76 148/77 138/77 144/73  Pulse: 58 62 62 61  Temp:      TempSrc:      Resp: 17 17 13 17   SpO2: 95% 98% 98% 97%      Constitutional: Not in distress. Filed Vitals:   12/10/15 1830 12/10/15 1845 12/10/15 1900 12/10/15 1915  BP: 135/76 148/77 138/77 144/73  Pulse: 58 62 62 61  Temp:      TempSrc:      Resp: 17 17 13 17   SpO2: 95% 98% 98% 97%   Eyes: Anicteric no pallor. ENMT: No discharge from the ears eyes nose or mouth. Neck: No mass felt. No JVD appreciated. Respiratory: No rhonchi or crepitations. Cardiovascular: S1 and S2 heard. Abdomen: Soft nontender bowel sounds present. Musculoskeletal: No edema. Skin: No rash. Neurologic: Alert awake oriented to time place and person. Moves all extremities. Psychiatric: Appears normal.   Labs on Admission: I have personally reviewed following labs and imaging studies  CBC:  Recent Labs Lab 12/10/15 1426  WBC 2.9*  HGB 11.4*  HCT 37.0*  MCV 79.4  PLT [id number]*   Basic Metabolic Panel:  Recent Labs Lab 12/10/15 1426  NA 138  K 4.9  CL 113*  CO2 17*  GLUCOSE 134*  BUN 46*  CREATININE 1.88*  CALCIUM 9.7   GFR: CrCl cannot be calculated (Unknown ideal weight.). Liver Function Tests:  Recent Labs Lab 12/10/15 1426  AST 21  ALT 26  ALKPHOS 184*  BILITOT 0.6  PROT 7.7  ALBUMIN 4.2    Recent Labs Lab 12/10/15 1426  LIPASE 13   No results for input(s): AMMONIA in the last 168 hours. Coagulation Profile: No results for input(s): INR, PROTIME in the last 168 hours. Cardiac Enzymes: No results for input(s): CKTOTAL, CKMB, CKMBINDEX, TROPONINI in the last 168 hours. BNP (last 3 results) No results for input(s): PROBNP in the last 8760 hours. HbA1C: No results for input(s): HGBA1C in the last 72 hours. CBG: No results for input(s): GLUCAP in the last 168 hours. Lipid Profile: No results for input(s): CHOL, HDL, LDLCALC, TRIG, CHOLHDL,  LDLDIRECT in the last 72 hours. Thyroid Function Tests: No results for input(s): TSH, T4TOTAL, FREET4, T3FREE, THYROIDAB in the last 72 hours. Anemia Panel: No results for input(s): VITAMINB12, FOLATE, FERRITIN, TIBC, IRON, RETICCTPCT in the last 72 hours. Urine analysis:    Component Value Date/Time   COLORURINE YELLOW 12/10/2015 1426   APPEARANCEUR CLEAR 12/10/2015 1426   LABSPEC 1.026 12/10/2015 1426   PHURINE 5.0 12/10/2015 1426   GLUCOSEU NEGATIVE 12/10/2015 1426   HGBUR NEGATIVE 12/10/2015 1426   BILIRUBINUR SMALL* 12/10/2015 1426   KETONESUR 15* 12/10/2015 1426   PROTEINUR NEGATIVE 12/10/2015 1426   NITRITE NEGATIVE 12/10/2015 1426   LEUKOCYTESUR NEGATIVE 12/10/2015 1426   Sepsis Labs: @LABRCNTIP (procalcitonin:4,lacticidven:4) )  No results found for this or any previous visit (from the past 240 hour(s)).   Radiological Exams on Admission: Dg Chest 2 View  12/10/2015  CLINICAL DATA:  Weakness, dehydration, diabetic, 8 month status post kidney transplant EXAM: CHEST  2 VIEW COMPARISON:  03/13/2014 FINDINGS: Stable cardiomegaly without CHF or pneumonia. No collapse, consolidation, edema, effusion or pneumothorax. Trachea is midline. Left cephalic venous stent noted over the shoulder. Monitor leads overlie the chest. IMPRESSION: Stable cardiomegaly without acute process. Electronically Signed   By: Jerilynn Mages.  Shick M.D.   On: 12/10/2015 20:32     Assessment/Plan Principal Problem:   Acute kidney injury ([place]) Active Problems:   Immunosuppression ([place])   Nausea vomiting and diarrhea   Diabetes mellitus type 2, controlled ([place])   Essential hypertension   ARF (acute renal failure) ([place])    1. Acute renal failure in a transplanted kidney - as per the patient patient's creatinine is usually around 1.3 which has increased 1.8. Most likely prerenal given the nausea vomiting and diarrhea. Continue with hydration. Holding on Bactrim for now. Closely follow intake and output and metabolic  panel. Continue immunosuppressants including mycophenolate and tacrolimus. Tacrolimus dose was decreased yesterday by patient's nephrologist. 2. Nausea vomiting and diarrhea in an immunosuppressed patient - check stool studies. 3. Pancytopenia - cause not clear. Follow CBC closely. 4. Diabetes mellitus type 2 - hold oral hypoglycemics for now and place patient on sliding scale coverage. 5. Hypertension on clonidine patch and amlodipine.   DVT prophylaxis: Heparin. Code Status: Full code.  Family Communication: No family at the bedside.  Disposition Plan: Home.  Consults called: None.  Admission status: Observation. Telemetry.    Rise Patience MD [facility] Pager [phone].  If 7PM-7AM, please contact night-coverage [email or web address] Password [facility]  12/10/2015, 9:54 PM

## 2015-12-11 DIAGNOSIS — E1121 Type 2 diabetes mellitus with diabetic nephropathy: Secondary | ICD-10-CM

## 2015-12-11 DIAGNOSIS — N170 Acute kidney failure with tubular necrosis: Secondary | ICD-10-CM

## 2015-12-11 LAB — COMPREHENSIVE METABOLIC PANEL
ALBUMIN: 3.5 g/dL (ref 3.5–5.0)
ALT: 24 U/L (ref 17–63)
ANION GAP: 8 (ref 5–15)
AST: 16 U/L (ref 15–41)
Alkaline Phosphatase: 157 U/L — ABNORMAL HIGH (ref 38–126)
BUN: 36 mg/dL — ABNORMAL HIGH (ref 6–20)
CO2: 17 mmol/L — ABNORMAL LOW (ref 22–32)
Calcium: 9.3 mg/dL (ref 8.9–10.3)
Chloride: 116 mmol/L — ABNORMAL HIGH (ref 101–111)
Creatinine, Ser: 1.34 mg/dL — ABNORMAL HIGH (ref 0.61–1.24)
GFR calc non Af Amer: 58 mL/min — ABNORMAL LOW (ref >60)
GLUCOSE: 135 mg/dL — AB (ref 65–99)
POTASSIUM: 4.5 mmol/L (ref 3.5–5.1)
SODIUM: 141 mmol/L (ref 135–145)
TOTAL PROTEIN: 6.2 g/dL — AB (ref 6.5–8.1)
Total Bilirubin: 0.3 mg/dL (ref 0.3–1.2)

## 2015-12-11 LAB — CBC
HEMATOCRIT: 36.3 % — AB (ref 39.0–52.0)
Hemoglobin: 10.8 g/dL — ABNORMAL LOW (ref 13.0–17.0)
MCH: 23.9 pg — ABNORMAL LOW (ref 26.0–34.0)
MCHC: 29.8 g/dL — AB (ref 30.0–36.0)
MCV: 80.3 fL (ref 78.0–100.0)
Platelets: 148 10*3/uL — ABNORMAL LOW (ref 150–400)
RBC: 4.52 MIL/uL (ref 4.22–5.81)
RDW: 14.3 % (ref 11.5–15.5)
WBC: 2.7 10*3/uL — ABNORMAL LOW (ref 4.0–10.5)

## 2015-12-11 LAB — GLUCOSE, CAPILLARY
GLUCOSE-CAPILLARY: 144 mg/dL — AB (ref 65–99)
Glucose-Capillary: 83 mg/dL (ref 65–99)

## 2015-12-11 MED ORDER — DOXAZOSIN MESYLATE 4 MG PO TABS: 4.0000 mg | ORAL_TABLET | Freq: Two times a day (BID) | ORAL | Status: DC

## 2015-12-11 NOTE — Progress Notes (Signed)
Discharge instructions RX's and follow up explained and provided to patient verbalized understanding. Patient left floor via wheelchair accompanied by staff no c/o pain or shortness of breath at d/c.  Darling Cieslewicz, Tivis Ringer, RN

## 2015-12-11 NOTE — Progress Notes (Signed)
Family brought patient's meds to bedside for MD to know his medications. Patient states he wants to keep them at bedside, family to come for it tomorrow 12/11/15. Patient educated to not take home medication while in the hospital.

## 2015-12-11 NOTE — Discharge Instructions (Signed)
Please hold Cardura until seen by PCP or nephrology  Please hold Bactrim until seen by nephrology

## 2015-12-11 NOTE — Progress Notes (Signed)
RN to collect stool sample again. Sample obtained is too formed for both GI panel and C diff tests per lab.

## 2015-12-11 NOTE — Progress Notes (Signed)
New Admission Note:  Arrival Method: Via stretcher with nurse Tech @ 21:10 Mental Orientation: Alert &oriented x4 Telemetry: Tele box 2, CCMD notified Assessment: Completed Skin: dry and intact IV: Right AC, infusing  Pain: denies any pain Tubes: n/a Safety Measures: Safety Fall Prevention Plan was given, discussed and signed. Admission: Completed [address] Orientation: Patient has been orientated to the room, unit and the staff. Family:none at bedside  Orders have been reviewed and implemented. Will continue to monitor the patient. Call light has been placed within reach and bed alarm has been activated.   Leandro Reasoner BSN, RN  Phone Number: [phone] [facility]

## 2015-12-11 NOTE — Discharge Summary (Signed)
Physician Discharge Summary  Kemarion Abbey MRN: [id number] DOB/AGE: 1961/05/24 55 y.o.  PCP: Rosita Fire, MD   Admit date: 12/10/2015 Discharge date: 12/11/2015  Discharge Diagnoses:     Principal Problem:   Acute kidney injury [facility]) Active Problems:   Immunosuppression ([place])   Nausea vomiting and diarrhea   Diabetes mellitus type 2, controlled ([place])   Essential hypertension   ARF (acute renal failure) ([place])   Pancytopenia ([place])    Follow-up recommendations Follow-up with PCP in 3-5 days , including all  additional recommended appointments as below Follow-up CBC, CMP in 3-5 days Advised patient to hold cardura for 1 week due to soft blood pressure       Current Discharge Medication List    CONTINUE these medications which have CHANGED   Details  doxazosin (CARDURA) 4 MG tablet Take 1 tablet (4 mg total) by mouth 2 (two) times daily. Qty: 10 tablet, Refills: 0      CONTINUE these medications which have NOT CHANGED   Details  amLODipine (NORVASC) 5 MG tablet Take 5 mg by mouth daily.    atorvastatin (LIPITOR) 40 MG tablet Take 40 mg by mouth daily.    cloNIDine (CATAPRES - DOSED IN MG/24 HR) 0.2 mg/24hr patch Place 0.2 mg onto the skin once a week. Pt states he is on 0.3 mg patch and changes every week    docusate sodium (COLACE) 100 MG capsule Take 100 mg by mouth 2 (two) times daily as needed for mild constipation.    glipiZIDE (GLUCOTROL XL) 5 MG 24 hr tablet Take 5 mg by mouth daily with breakfast.    mycophenolate (MYFORTIC) 360 MG TBEC EC tablet Take 720 mg by mouth 2 (two) times daily.    omeprazole (PRILOSEC) 20 MG capsule Take 1 capsule (20 mg total) by mouth daily. Qty: 90 capsule, Refills: 3   Associated Diagnoses: Gastroesophageal reflux disease, esophagitis presence not specified    sodium bicarbonate 650 MG tablet Take 1,300 mg by mouth 2 (two) times daily.    sulfamethoxazole-trimethoprim (BACTRIM,SEPTRA) 400-80 MG tablet Take 1 tablet by  mouth 3 (three) times a week.    tacrolimus (PROGRAF) 1 MG capsule Take 2 mg by mouth 2 (two) times daily. 2 capsules every morning, 2 capsules in the evening    Vitamin D, Ergocalciferol, (DRISDOL) 50000 units CAPS capsule Take 50,000 Units by mouth every 30 (thirty) days.         Discharge Condition: Stable   Discharge Instructions Get Medicines reviewed and adjusted: Please take all your medications with you for your next visit with your Primary MD  Please request your Primary MD to go over all hospital tests and procedure/radiological results at the follow up, please ask your Primary MD to get all Hospital records sent to his/her office.  If you experience worsening of your admission symptoms, develop shortness of breath, life threatening emergency, suicidal or homicidal thoughts you must seek medical attention immediately by calling 911 or calling your MD immediately if symptoms less severe.  You must read complete instructions/literature along with all the possible adverse reactions/side effects for all the Medicines you take and that have been prescribed to you. Take any new Medicines after you have completely understood and accpet all the possible adverse reactions/side effects.   Do not drive when taking Pain medications.   Do not take more than prescribed Pain, Sleep and Anxiety Medications  Special Instructions: If you have smoked or chewed Tobacco in the last 2 yrs please stop smoking, stop  any regular Alcohol and or any Recreational drug use.  Wear Seat belts while driving.  Please note  You were cared for by a hospitalist during your hospital stay. Once you are discharged, your primary care physician will handle any further medical issues. Please note that NO REFILLS for any discharge medications will be authorized once you are discharged, as it is imperative that you return to your primary care physician (or establish a relationship with a primary care physician if  you do not have one) for your aftercare needs so that they can reassess your need for medications and monitor your lab values.  Discharge Instructions    Diet - low sodium heart healthy    Complete by:  As directed      Increase activity slowly    Complete by:  As directed             No Known Allergies    Disposition: 01-Home or Self Care   Consults:  None    Significant Diagnostic Studies:  Dg Chest 2 View  12/10/2015  CLINICAL DATA:  Weakness, dehydration, diabetic, 8 month status post kidney transplant EXAM: CHEST  2 VIEW COMPARISON:  03/13/2014 FINDINGS: Stable cardiomegaly without CHF or pneumonia. No collapse, consolidation, edema, effusion or pneumothorax. Trachea is midline. Left cephalic venous stent noted over the shoulder. Monitor leads overlie the chest. IMPRESSION: Stable cardiomegaly without acute process. Electronically Signed   By: Jerilynn Mages.  Shick M.D.   On: 12/10/2015 20:32      Labs: Results for orders placed or performed during the hospital encounter of 12/10/15 (from the past 48 hour(s))  Lipase, blood     Status: None   Collection Time: 12/10/15  2:26 PM  Result Value Ref Range   Lipase 13 11 - 51 U/L  Comprehensive metabolic panel     Status: Abnormal   Collection Time: 12/10/15  2:26 PM  Result Value Ref Range   Sodium 138 135 - 145 mmol/L   Potassium 4.9 3.5 - 5.1 mmol/L   Chloride 113 (H) 101 - 111 mmol/L   CO2 17 (L) 22 - 32 mmol/L   Glucose, Bld 134 (H) 65 - 99 mg/dL   BUN 46 (H) 6 - 20 mg/dL   Creatinine, Ser 1.88 (H) 0.61 - 1.24 mg/dL   Calcium 9.7 8.9 - 10.3 mg/dL   Total Protein 7.7 6.5 - 8.1 g/dL   Albumin 4.2 3.5 - 5.0 g/dL   AST 21 15 - 41 U/L   ALT 26 17 - 63 U/L   Alkaline Phosphatase 184 (H) 38 - 126 U/L   Total Bilirubin 0.6 0.3 - 1.2 mg/dL   GFR calc non Af Amer 39 (L) >60 mL/min   GFR calc Af Amer 45 (L) >60 mL/min    Comment: (NOTE) The eGFR has been calculated using the CKD EPI equation. This calculation has not been  validated in all clinical situations. eGFR's persistently <60 mL/min signify possible Chronic Kidney Disease.    Anion gap 8 5 - 15  CBC     Status: Abnormal   Collection Time: 12/10/15  2:26 PM  Result Value Ref Range   WBC 2.9 (L) 4.0 - 10.5 K/uL   RBC 4.66 4.22 - 5.81 MIL/uL   Hemoglobin 11.4 (L) 13.0 - 17.0 g/dL   HCT 37.0 (L) 39.0 - 52.0 %   MCV 79.4 78.0 - 100.0 fL   MCH 24.5 (L) 26.0 - 34.0 pg   MCHC 30.8 30.0 - 36.0 g/dL  RDW 14.3 11.5 - 15.5 %   Platelets 142 (L) 150 - 400 K/uL  Urinalysis, Routine w reflex microscopic     Status: Abnormal   Collection Time: 12/10/15  2:26 PM  Result Value Ref Range   Color, Urine YELLOW YELLOW   APPearance CLEAR CLEAR   Specific Gravity, Urine 1.026 1.005 - 1.030   pH 5.0 5.0 - 8.0   Glucose, UA NEGATIVE NEGATIVE mg/dL   Hgb urine dipstick NEGATIVE NEGATIVE   Bilirubin Urine SMALL (A) NEGATIVE   Ketones, ur 15 (A) NEGATIVE mg/dL   Protein, ur NEGATIVE NEGATIVE mg/dL   Nitrite NEGATIVE NEGATIVE   Leukocytes, UA NEGATIVE NEGATIVE    Comment: MICROSCOPIC NOT DONE ON URINES WITH NEGATIVE PROTEIN, BLOOD, LEUKOCYTES, NITRITE, OR GLUCOSE <1000 mg/dL.  I-Stat CG4 Lactic Acid, ED     Status: None   Collection Time: 12/10/15  7:52 PM  Result Value Ref Range   Lactic Acid, Venous 0.51 0.5 - 1.9 mmol/L  Glucose, capillary     Status: Abnormal   Collection Time: 12/10/15 10:07 PM  Result Value Ref Range   Glucose-Capillary 117 (H) 65 - 99 mg/dL  CBC     Status: Abnormal   Collection Time: 12/11/15  5:04 AM  Result Value Ref Range   WBC 2.7 (L) 4.0 - 10.5 K/uL   RBC 4.52 4.22 - 5.81 MIL/uL   Hemoglobin 10.8 (L) 13.0 - 17.0 g/dL   HCT 36.3 (L) 39.0 - 52.0 %   MCV 80.3 78.0 - 100.0 fL   MCH 23.9 (L) 26.0 - 34.0 pg   MCHC 29.8 (L) 30.0 - 36.0 g/dL   RDW 14.3 11.5 - 15.5 %   Platelets 148 (L) 150 - 400 K/uL  Comprehensive metabolic panel     Status: Abnormal   Collection Time: 12/11/15  5:04 AM  Result Value Ref Range   Sodium 141 135  - 145 mmol/L   Potassium 4.5 3.5 - 5.1 mmol/L   Chloride 116 (H) 101 - 111 mmol/L   CO2 17 (L) 22 - 32 mmol/L   Glucose, Bld 135 (H) 65 - 99 mg/dL   BUN 36 (H) 6 - 20 mg/dL   Creatinine, Ser 1.34 (H) 0.61 - 1.24 mg/dL   Calcium 9.3 8.9 - 10.3 mg/dL   Total Protein 6.2 (L) 6.5 - 8.1 g/dL   Albumin 3.5 3.5 - 5.0 g/dL   AST 16 15 - 41 U/L   ALT 24 17 - 63 U/L   Alkaline Phosphatase 157 (H) 38 - 126 U/L   Total Bilirubin 0.3 0.3 - 1.2 mg/dL   GFR calc non Af Amer 58 (L) >60 mL/min   GFR calc Af Amer >60 >60 mL/min    Comment: (NOTE) The eGFR has been calculated using the CKD EPI equation. This calculation has not been validated in all clinical situations. eGFR's persistently <60 mL/min signify possible Chronic Kidney Disease.    Anion gap 8 5 - 15  Glucose, capillary     Status: None   Collection Time: 12/11/15  8:16 AM  Result Value Ref Range   Glucose-Capillary 83 65 - 99 mg/dL     Lipid Panel  No results found for: CHOL, TRIG, HDL, CHOLHDL, VLDL, LDLCALC, LDLDIRECT   No results found for: HGBA1C   Lab Results  Component Value Date   CREATININE 1.34* 12/11/2015     HPI : 55 y.o. male with renal transplant on immunosuppressants and Bactrim, hypertension, diabetes mellitus type 2 was advised to  come to the ER by patient's transplant nephrologist at [facility] since patient's creatinine was elevated to 1.8. Patient's baseline creatinine as per the patient was around 1.3. Patient has been having increasing nausea vomiting diarrhea over the last 2 weeks. Denies any fever or chills or abdominal pain. Patient was given fluid bolus in the ER and admitted for further hydration. Patient in addition was advised to decrease his tacrolimus dose by patient's transplant nephrologist yesterday due to renal failure. On exam patient is not in distress. Denies any recent sick contacts or hospitalization. Denies any recent travel.  HOSPITAL COURSE:   1. Acute renal failure in a transplanted  kidney - as per the patient patient's creatinine is usually around 1.3 which has increased 1.8. Most likely prerenal given the nausea vomiting and diarrhea. Creatinine improved with IV hydration from 1.88-1.34. Patient advised to hold Bactrim for now. Closely follow intake and output and metabolic panel. Continue immunosuppressants including mycophenolate and tacrolimus. Tacrolimus dose was decreased yesterday by patient's nephrologist. 2. Nausea vomiting and diarrhea in an immunosuppressed patient -stools too  Formed to be sent for testing, diarrhea resolved 3. Pancytopenia - cause not clear. Follow CBC closely. 4. Diabetes mellitus type 2 - resume oral hypoglycemics 5. Hypertension on clonidine patch and amlodipine. Blood pressure soft, patient advised to hold Cardura  Discharge Exam:  Blood pressure 128/56, pulse 56, temperature 98.4 F (36.9 C), temperature source Oral, resp. rate 17, SpO2 99 %.  ENMT: No discharge from the ears eyes nose or mouth. Neck: No mass felt. No JVD appreciated. Respiratory: No rhonchi or crepitations. Cardiovascular: S1 and S2 heard. Abdomen: Soft nontender bowel sounds present. Musculoskeletal: No edema. Skin: No rash. Neurologic: Alert awake oriented to time place and person. Moves all extremities. Psychiatric: Appears normal.    Follow-up Information    Follow up with FANTA,TESFAYE, MD. Schedule an appointment as soon as possible for a visit in 3 days.   Specialty:  Internal Medicine   Contact information:   [place] [address] [phone]       Signed: Reyne Dumas 12/11/2015, 11:15 AM        Time spent >45 mins

## 2015-12-12 LAB — URINE CULTURE: Culture: NO GROWTH

## 2015-12-15 LAB — CULTURE, BLOOD (ROUTINE X 2)
CULTURE: NO GROWTH
Culture: NO GROWTH

## 2016-03-04 DIAGNOSIS — E113519 Type 2 diabetes mellitus with proliferative diabetic retinopathy with macular edema, unspecified eye: Secondary | ICD-10-CM | POA: Insufficient documentation

## 2016-03-04 DIAGNOSIS — N19 Unspecified kidney failure: Secondary | ICD-10-CM | POA: Insufficient documentation

## 2016-03-04 DIAGNOSIS — E113511 Type 2 diabetes mellitus with proliferative diabetic retinopathy with macular edema, right eye: Secondary | ICD-10-CM | POA: Insufficient documentation

## 2016-03-04 DIAGNOSIS — I152 Hypertension secondary to endocrine disorders: Secondary | ICD-10-CM | POA: Insufficient documentation

## 2016-10-07 DIAGNOSIS — H2511 Age-related nuclear cataract, right eye: Secondary | ICD-10-CM | POA: Insufficient documentation

## 2016-10-07 DIAGNOSIS — Z961 Presence of intraocular lens: Secondary | ICD-10-CM | POA: Insufficient documentation

## 2017-07-28 ENCOUNTER — Encounter: Payer: Self-pay | Admitting: "Endocrinology

## 2017-08-31 DIAGNOSIS — H900 Conductive hearing loss, bilateral: Secondary | ICD-10-CM | POA: Insufficient documentation

## 2017-08-31 DIAGNOSIS — H9193 Unspecified hearing loss, bilateral: Secondary | ICD-10-CM | POA: Insufficient documentation

## 2017-08-31 DIAGNOSIS — H6123 Impacted cerumen, bilateral: Secondary | ICD-10-CM | POA: Insufficient documentation

## 2017-10-28 ENCOUNTER — Encounter: Payer: Self-pay | Admitting: "Endocrinology

## 2017-11-23 ENCOUNTER — Encounter: Payer: Self-pay | Admitting: "Endocrinology

## 2018-01-07 ENCOUNTER — Ambulatory Visit: Payer: Self-pay | Admitting: "Endocrinology

## 2018-02-18 ENCOUNTER — Ambulatory Visit (INDEPENDENT_AMBULATORY_CARE_PROVIDER_SITE_OTHER): Payer: BLUE CROSS/BLUE SHIELD | Admitting: "Endocrinology

## 2018-02-18 ENCOUNTER — Encounter: Payer: Self-pay | Admitting: "Endocrinology

## 2018-02-18 VITALS — BP 129/80 | HR 50 | Ht 69.29 in | Wt 212.0 lb

## 2018-02-18 DIAGNOSIS — N186 End stage renal disease: Secondary | ICD-10-CM

## 2018-02-18 DIAGNOSIS — I10 Essential (primary) hypertension: Secondary | ICD-10-CM | POA: Diagnosis not present

## 2018-02-18 DIAGNOSIS — IMO0002 Reserved for concepts with insufficient information to code with codable children: Secondary | ICD-10-CM

## 2018-02-18 DIAGNOSIS — E1165 Type 2 diabetes mellitus with hyperglycemia: Secondary | ICD-10-CM

## 2018-02-18 DIAGNOSIS — E6609 Other obesity due to excess calories: Secondary | ICD-10-CM | POA: Diagnosis not present

## 2018-02-18 DIAGNOSIS — E1122 Type 2 diabetes mellitus with diabetic chronic kidney disease: Secondary | ICD-10-CM | POA: Diagnosis not present

## 2018-02-18 DIAGNOSIS — E782 Mixed hyperlipidemia: Secondary | ICD-10-CM | POA: Diagnosis not present

## 2018-02-18 DIAGNOSIS — Z940 Kidney transplant status: Secondary | ICD-10-CM

## 2018-02-18 DIAGNOSIS — Z6831 Body mass index (BMI) 31.0-31.9, adult: Secondary | ICD-10-CM

## 2018-02-18 MED ORDER — GLUCOSE BLOOD VI STRP: ORAL_STRIP | 2 refills | Status: DC

## 2018-02-18 MED ORDER — ACCU-CHEK GUIDE ME W/DEVICE KIT: 1.0000 | PACK | 0 refills | Status: DC

## 2018-02-18 MED ORDER — INSULIN PEN NEEDLE 31G X 8 MM MISC: 1.0000 | 3 refills | Status: DC

## 2018-02-18 MED ORDER — INSULIN GLARGINE 100 UNIT/ML SOLOSTAR PEN: 30.0000 [IU] | PEN_INJECTOR | Freq: Every day | SUBCUTANEOUS | 2 refills | Status: DC

## 2018-02-18 NOTE — Progress Notes (Signed)
Endocrinology Consult Note       02/18/2018, 5:57 PM   Subjective:    Patient ID: Raymond [name], male    DOB: 08/05/1960.  Raymond [name] is being seen in consultation for management of currently uncontrolled symptomatic complicated type 2 diabetes with end-stage renal disease status post renal transplant, hypertension, hyperlipidemia diabetes requested by  Rosita Fire, MD.   Past Medical History:  Diagnosis Date  . Chronic kidney disease    Awaiting transplant  . Diabetes ([name])   . Diabetes mellitus without complication ([place])   . Hypertension   . Renal disorder    Past Surgical History:  Procedure Laterality Date  . COLONOSCOPY N/A 12/05/2014   [id number] external and internal hemorrhoid/mild diverticulosis/11 polyps removed  . ESOPHAGOGASTRODUODENOSCOPY N/A 12/05/2014   MVH:[id number] duodenitis  . KIDNEY TRANSPLANT  03/2015   Social History   Socioeconomic History  . Marital status: Married    Spouse name: Not on file  . Number of children: Not on file  . Years of education: Not on file  . Highest education level: Not on file  Occupational History  . Not on file  Social Needs  . Financial resource strain: Not on file  . Food insecurity:    Worry: Not on file    Inability: Not on file  . Transportation needs:    Medical: Not on file    Non-medical: Not on file  Tobacco Use  . Smoking status: Never Smoker  . Smokeless tobacco: Never Used  Substance and Sexual Activity  . Alcohol use: No    Alcohol/week: 0.0 standard drinks  . Drug use: No  . Sexual activity: Not on file  Lifestyle  . Physical activity:    Days per week: Not on file    Minutes per session: Not on file  . Stress: Not on file  Relationships  . Social connections:    Talks on phone: Not on file    Gets together: Not on file    Attends religious service: Not on file    Active member of club or organization: Not  on file    Attends meetings of clubs or organizations: Not on file    Relationship status: Not on file  Other Topics Concern  . Not on file  Social History Narrative   ** Merged History Encounter **       Outpatient Encounter Medications as of 02/18/2018  Medication Sig  . acetaminophen (TYLENOL) 500 MG tablet Take 500 mg by mouth every 6 (six) hours as needed.  . bisacodyl (BISACODYL) 5 MG EC tablet Take 5 mg by mouth daily as needed for moderate constipation.  . Brinzolamide-Brimonidine (SIMBRINZA) 1-0.2 % SUSP Apply to eye.  . cloNIDine (CATAPRES - DOSED IN MG/24 HR) 0.3 mg/24hr patch Place 0.3 mg onto the skin once a week.  . hydrALAZINE (APRESOLINE) 25 MG tablet Take 25 mg by mouth 2 (two) times daily.  . hydrOXYzine (ATARAX/VISTARIL) 25 MG tablet Take 25 mg by mouth 2 (two) times daily as needed.  . Multiple Vitamin (MULTIVITAMIN) capsule Take 1 capsule by mouth daily.  . simethicone (MYLICON) 80 MG chewable tablet  Chew 80 mg by mouth every 6 (six) hours as needed for flatulence.  . sodium bicarbonate 650 MG tablet Take 1,300 mg by mouth 2 (two) times daily.  . tacrolimus (PROGRAF) 1 MG capsule Take 2 mg by mouth 2 (two) times daily. 2 capsules every morning, 2 capsules in the evening  . Vitamin D, Ergocalciferol, (DRISDOL) 50000 units CAPS capsule Take 50,000 Units by mouth every 30 (thirty) days.  . [DISCONTINUED] doxazosin (CARDURA) 4 MG tablet Take 1 tablet (4 mg total) by mouth 2 (two) times daily.  . [DISCONTINUED] glipiZIDE (GLUCOTROL XL) 5 MG 24 hr tablet Take 5 mg by mouth daily with breakfast.  . [DISCONTINUED] insulin NPH Human (HUMULIN N,NOVOLIN N) 100 UNIT/ML injection Inject 40 Units into the skin at bedtime.   . Blood Glucose Monitoring Suppl (ACCU-CHEK GUIDE ME) w/Device KIT 1 Piece by Does not apply route as directed.  [name] glucose blood (ACCU-CHEK GUIDE) test strip Use as instructed  . Insulin Glargine (LANTUS SOLOSTAR) 100 UNIT/ML Solostar Pen Inject 30 Units into the  skin daily.  . Insulin Pen Needle (B-D ULTRAFINE III SHORT PEN) 31G X 8 MM MISC 1 each by Does not apply route as directed.  . [DISCONTINUED] amLODipine (NORVASC) 5 MG tablet Take 5 mg by mouth daily.  . [DISCONTINUED] atorvastatin (LIPITOR) 40 MG tablet Take 40 mg by mouth daily.  . [DISCONTINUED] cloNIDine (CATAPRES - DOSED IN MG/24 HR) 0.2 mg/24hr patch Place 0.2 mg onto the skin once a week. Pt states he is on 0.3 mg patch and changes every week  . [DISCONTINUED] docusate sodium (COLACE) 100 MG capsule Take 100 mg by mouth 2 (two) times daily as needed for mild constipation.  . [DISCONTINUED] mycophenolate (MYFORTIC) 360 MG TBEC EC tablet Take 720 mg by mouth 2 (two) times daily.  . [DISCONTINUED] omeprazole (PRILOSEC) 20 MG capsule Take 1 capsule (20 mg total) by mouth daily.  . [DISCONTINUED] sulfamethoxazole-trimethoprim (BACTRIM,SEPTRA) 400-80 MG tablet Take 1 tablet by mouth 3 (three) times a week.   No facility-administered encounter medications on file as of 02/18/2018.     ALLERGIES: No Known Allergies  VACCINATION STATUS:  There is no immunization history on file for this patient.  Diabetes  He presents for his initial diabetic visit. He has type 2 diabetes mellitus. Onset time: He was diagnosed at approximate age of 57 years. His disease course has been worsening. There are no hypoglycemic associated symptoms. Pertinent negatives for hypoglycemia include no confusion, headaches, pallor or seizures. Associated symptoms include blurred vision, polydipsia and polyuria. Pertinent negatives for diabetes include no chest pain, no fatigue, no polyphagia and no weakness. There are no hypoglycemic complications. Symptoms are worsening. Diabetic complications include nephropathy and retinopathy. (He had end-stage renal disease as a complication, status post cadaveric kidney transplant in 2016.) Risk factors for coronary artery disease include dyslipidemia, diabetes mellitus, family history,  hypertension, male sex, obesity and sedentary lifestyle. His weight is fluctuating minimally. He is following a generally unhealthy diet. When asked about meal planning, he reported none. He has not had a previous visit with a dietitian. He never participates in exercise. (Patient did not bring any logs nor meter to review, A1c was reported to be 13.6% 2 months ago.) An ACE inhibitor/angiotensin II receptor blocker is not being taken.  Hyperlipidemia  This is a chronic problem. The current episode started more than 1 year ago. Exacerbating diseases include chronic renal disease, diabetes and obesity. Pertinent negatives include no chest pain, myalgias or shortness of  breath. Current antihyperlipidemic treatment includes statins (Is currently on NPH 40 units nightly.). Risk factors for coronary artery disease include diabetes mellitus, dyslipidemia, family history, obesity, male sex, hypertension and a sedentary lifestyle.  Hypertension  This is a chronic problem. The current episode started more than 1 year ago. The problem is controlled. Associated symptoms include blurred vision. Pertinent negatives include no chest pain, headaches, neck pain, palpitations or shortness of breath. Risk factors for coronary artery disease include dyslipidemia, diabetes mellitus, male gender, obesity, sedentary lifestyle and family history. Past treatments include central alpha agonists. Hypertensive end-organ damage includes kidney disease and retinopathy. Patient is status post kidney transplant in 2016.. Identifiable causes of hypertension include chronic renal disease.    Review of Systems  Constitutional: Negative for chills, fatigue, fever and unexpected weight change.  HENT: Negative for dental problem, mouth sores and trouble swallowing.   Eyes: Positive for blurred vision. Negative for visual disturbance.  Respiratory: Negative for cough, choking, chest tightness, shortness of breath and wheezing.    Cardiovascular: Negative for chest pain, palpitations and leg swelling.  Gastrointestinal: Negative for abdominal distention, abdominal pain, constipation, diarrhea, nausea and vomiting.  Endocrine: Positive for polydipsia and polyuria. Negative for polyphagia.  Genitourinary: Negative for dysuria, flank pain, hematuria and urgency.  Musculoskeletal: Negative for back pain, gait problem, myalgias and neck pain.  Skin: Negative for pallor, rash and wound.  Neurological: Negative for seizures, syncope, weakness, numbness and headaches.  Psychiatric/Behavioral: Negative for confusion and dysphoric mood.    Objective:    BP 129/80   Pulse (!) 50   Ht 5' 9.29" (1.76 m)   Wt 212 lb (96.2 kg)   BMI 31.05 kg/m   Wt Readings from Last 3 Encounters:  02/18/18 212 lb (96.2 kg)  12/05/14 215 lb (97.5 kg)  11/15/14 215 lb 12.8 oz (97.9 kg)     Physical Exam  Constitutional: He is oriented to person, place, and time. He appears well-developed and well-nourished. He is cooperative. No distress.  HENT:  Head: Normocephalic and atraumatic.  Eyes: EOM are normal.  Neck: Normal range of motion. Neck supple. No tracheal deviation present. No thyromegaly present.  Cardiovascular: Normal rate, S1 normal, S2 normal and normal heart sounds. Exam reveals no gallop.  No murmur heard. Pulses:      Dorsalis pedis pulses are 1+ on the right side, and 1+ on the left side.       Posterior tibial pulses are 1+ on the right side, and 1+ on the left side.  Pulmonary/Chest: Effort normal and breath sounds normal. No respiratory distress. He has no wheezes.  Abdominal: Soft. Bowel sounds are normal. He exhibits no distension. There is no tenderness. There is no guarding and no CVA tenderness.  Musculoskeletal: He exhibits no edema.       Right shoulder: He exhibits no swelling and no deformity.  Dialysis fistula with bruit on left upper extremity.  Neurological: He is alert and oriented to person, place, and  time. He has normal strength and normal reflexes. No cranial nerve deficit or sensory deficit. Gait normal.  Skin: Skin is warm and dry. No rash noted. No cyanosis. Nails show no clubbing.  Psychiatric: He has a normal mood and affect. His speech is normal. Judgment normal. Cognition and memory are normal.  Patient has reluctant affect.      CMP ( most recent) CMP     Component Value Date/Time   NA 141 12/11/2015 0504   NA 142 03/13/2014 1049  K 4.5 12/11/2015 0504   K 4.1 03/13/2014 1049   CL 116 (H) 12/11/2015 0504   CL 101 03/13/2014 1049   CO2 17 (L) 12/11/2015 0504   CO2 30 03/13/2014 1049   GLUCOSE 135 (H) 12/11/2015 0504   GLUCOSE 217 (H) 03/13/2014 1049   BUN 36 (H) 12/11/2015 0504   BUN 45 (H) 03/13/2014 1049   CREATININE 1.34 (H) 12/11/2015 0504   CREATININE 9.58 (H) 03/13/2014 1049   CALCIUM 9.3 12/11/2015 0504   CALCIUM 8.2 (L) 03/13/2014 1049   PROT 6.2 (L) 12/11/2015 0504   ALBUMIN 3.5 12/11/2015 0504   AST 16 12/11/2015 0504   ALT 24 12/11/2015 0504   ALKPHOS 157 (H) 12/11/2015 0504   BILITOT 0.3 12/11/2015 0504   GFRNONAA 58 (L) 12/11/2015 0504   GFRNONAA 6 (L) 03/13/2014 1049   GFRNONAA 8 (L) 09/07/2013 1435   GFRAA >60 12/11/2015 0504   GFRAA 7 (L) 03/13/2014 1049   GFRAA 9 (L) 09/07/2013 1435     Assessment & Plan:   1. 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) ([place]) status post cadaveric renal transplant 2016 - Shown Dissinger has currently uncontrolled symptomatic type 2 DM since 57 years of age,  with most recent A1c of reported 13.6 %. Recent labs reviewed.  -his diabetes is complicated by stage 3 insufficiency of transplanted kidney ( 2016 in [place] area with [facility] nephrology).  He has recently moved to this area. He is  status post renal transplant, retinopathy, obesity/sedentary life and he remains at extremely high risk for more acute and chronic complications which include CAD, CVA, CKD, retinopathy,  and neuropathy. These are all discussed in detail with him.  - I have counseled him on diet management and weight loss, by adopting a carbohydrate restricted/protein rich diet.  - Suggestion is made for him to avoid simple carbohydrates  from his diet including Cakes, Sweet Desserts, Ice Cream, Soda (diet and regular), Sweet Tea, Candies, Chips, Cookies, Store Bought Juices, Alcohol in Excess of  1-2 drinks a day, Artificial Sweeteners,  Coffee Creamer, and "Sugar-free" Products. This will help patient to have more stable blood glucose profile and potentially avoid unintended weight gain.  - I encouraged him to switch to  unprocessed or minimally processed complex starch and increased protein intake (animal or plant source), fruits, and vegetables.  - he is advised to stick to a routine mealtimes to eat 3 meals  a day and avoid unnecessary snacks ( to snack only to correct hypoglycemia).   - he will be scheduled with Jearld Fenton, RDN, CDE for individualized diabetes education.  - I have approached him with the following individualized plan to manage diabetes and patient agrees:   -Given his current and prevailing glycemic burden and extremely high risk of complications, he will likely require intensive treatment with basal/bolus insulin in order for him to achieve control of diabetes to target.    -Patient is currently on NPH 40 units nightly as his only medication for diabetes with A1c of 13.6%.   -I discussed and initiated Toujeo sample with 30 units nightly, and initiate strict monitoring of blood glucose 4 times a day-before meals and at bedtime and return in 10 days for reevaluation. -I will prescribe Lantus to his pharmacy.  I advised him to use stop NPH. -He will be considered for NovoLog or Humalog if he returns with above target postprandial hyperglycemia. - he is encouraged to call clinic for blood glucose levels less than  70 or above 300 mg /dl. -He is not a suitable candidate for  metformin, SGLT2 inhibitors, nor incretin therapy. -He is urged about the need for strict follow-up until his his insulin regimen is stabilized.   - Patient specific target  A1c;  LDL, HDL, Triglycerides, and  Waist Circumference were discussed in detail.  2) BP/HTN:  his blood pressure is  controlled to target.   he is advised to continue his current medications including clonidine 0.3 mg patch weekly, and hydralazine 25 mg p.o. twice daily.    3) Lipids/HPL: No recent lipid panel to review.  He will sign medical release form to get his labs.  He is advised to continue atorvastatin 40 mg p.o. nightly.  Side effects and precautions discussed with him.  4)  Weight/Diet:  Body mass index is 31.05 kg/m.  - clearly complicating his diabetes care.  I discussed with him the fact that loss of 5 - 10% of his  current body weight will have the most impact on his diabetes management.  CDE Consult will be initiated . Exercise, and detailed carbohydrates information provided  -  detailed on discharge instructions.  5) Chronic Care/Health Maintenance:  -he  is not on ACEI/ARB , he is on Statin medications and  is encouraged to initiate and continue to follow up with Ophthalmology, Dentist,  Podiatrist at least yearly or according to recommendations, and advised to  stay away from smoking. I have recommended yearly flu vaccine and pneumonia vaccine at least every 5 years; moderate intensity exercise for up to 150 minutes weekly; and  sleep for at least 7 hours a day.  - I advised patient to maintain close follow up with Rosita Fire, MD for primary care needs.  - Time spent with the patient: 45 minutes, of which >50% was spent in obtaining information about his symptoms, reviewing his previous labs, evaluations, and treatments, counseling him about his currently complicated type 2 diabetes, hypertension, hyperlipidemia, and developing developing  plans for long term treatment based on the latest  recommendations.  [name] participated in the discussions, expressed understanding, and voiced agreement with the above plans.  All questions were answered to his satisfaction. he is encouraged to contact clinic should he have any questions or concerns prior to his return visit.  Follow up plan: - Return in about 10 days (around 02/28/2018) for Follow up with Meter and Logs Only - no Labs.  Glade Lloyd, MD [facility] [address] [place], [place] [address] Phone: [phone]  Fax: [phone]    02/18/2018, 5:57 PM  This note was partially dictated with voice recognition software. Similar sounding words can be transcribed inadequately or may not  be corrected upon review.

## 2018-02-18 NOTE — Patient Instructions (Signed)

## 2018-02-19 ENCOUNTER — Other Ambulatory Visit: Payer: Self-pay

## 2018-02-19 DIAGNOSIS — Z940 Kidney transplant status: Secondary | ICD-10-CM | POA: Insufficient documentation

## 2018-02-19 DIAGNOSIS — E6609 Other obesity due to excess calories: Secondary | ICD-10-CM | POA: Insufficient documentation

## 2018-02-19 DIAGNOSIS — Z6831 Body mass index (BMI) 31.0-31.9, adult: Secondary | ICD-10-CM

## 2018-02-19 DIAGNOSIS — E782 Mixed hyperlipidemia: Secondary | ICD-10-CM | POA: Insufficient documentation

## 2018-02-19 MED ORDER — GLUCOSE BLOOD VI STRP: ORAL_STRIP | 5 refills | Status: DC

## 2018-03-01 ENCOUNTER — Ambulatory Visit (INDEPENDENT_AMBULATORY_CARE_PROVIDER_SITE_OTHER): Payer: BLUE CROSS/BLUE SHIELD | Admitting: Podiatry

## 2018-03-01 ENCOUNTER — Telehealth: Payer: Self-pay | Admitting: *Deleted

## 2018-03-01 ENCOUNTER — Ambulatory Visit (INDEPENDENT_AMBULATORY_CARE_PROVIDER_SITE_OTHER): Payer: BLUE CROSS/BLUE SHIELD

## 2018-03-01 DIAGNOSIS — L97512 Non-pressure chronic ulcer of other part of right foot with fat layer exposed: Secondary | ICD-10-CM | POA: Diagnosis not present

## 2018-03-01 DIAGNOSIS — L03119 Cellulitis of unspecified part of limb: Secondary | ICD-10-CM

## 2018-03-01 DIAGNOSIS — L97519 Non-pressure chronic ulcer of other part of right foot with unspecified severity: Secondary | ICD-10-CM

## 2018-03-01 DIAGNOSIS — R0989 Other specified symptoms and signs involving the circulatory and respiratory systems: Secondary | ICD-10-CM

## 2018-03-01 MED ORDER — MUPIROCIN 2 % EX OINT: 1.0000 "application " | TOPICAL_OINTMENT | Freq: Two times a day (BID) | CUTANEOUS | 2 refills | Status: DC

## 2018-03-01 MED ORDER — DOXYCYCLINE HYCLATE 100 MG PO TABS: 100.0000 mg | ORAL_TABLET | Freq: Two times a day (BID) | ORAL | 0 refills | Status: DC

## 2018-03-01 NOTE — Telephone Encounter (Signed)
Orders faxed to [facility] - Doppler lab, and referral to Main Fax with demographics and clinicals.

## 2018-03-01 NOTE — Telephone Encounter (Signed)
-----   Message from Trula Slade, DPM sent at 03/01/2018  1:47 PM EDT ----- Can you please order an arterial duplex and consult due to decreased pulses and wound right foot? Thanks.

## 2018-03-02 NOTE — Progress Notes (Signed)
Subjective:   Patient ID: [name], male   DOB: 57 y.o.   MRN: [id number]   HPI 57 year old male presents the office today for concerns of a wound on the bottom of the right foot which is been ongoing for the last week.  He states that he had a callus there and the area has gotten larger.  He denies any drainage or pus.  He has noted some swelling to the right foot.  He has had no red streaks or any redness of the foot.  He also states that he has a history of a wound on the right foot that required a wound VAC.  He is diabetic he thinks his A1c is around 11.  He has no other concerns today.   Review of Systems  All other systems reviewed and are negative.  Past Medical History:  Diagnosis Date  . Chronic kidney disease    Awaiting transplant  . Diabetes ([place])   . Diabetes mellitus without complication ([place])   . Hypertension   . Renal disorder     Past Surgical History:  Procedure Laterality Date  . COLONOSCOPY N/A 12/05/2014   [id number] external and internal hemorrhoid/mild diverticulosis/11 polyps removed  . ESOPHAGOGASTRODUODENOSCOPY N/A 12/05/2014   TRZ:[id number] duodenitis  . KIDNEY TRANSPLANT  03/2015     Current Outpatient Medications:  .  acetaminophen (TYLENOL) 500 MG tablet, Take 500 mg by mouth every 6 (six) hours as needed., Disp: , Rfl:  .  bisacodyl (BISACODYL) 5 MG EC tablet, Take 5 mg by mouth daily as needed for moderate constipation., Disp: , Rfl:  .  Blood Glucose Monitoring Suppl (ACCU-CHEK GUIDE ME) w/Device KIT, 1 Piece by Does not apply route as directed., Disp: 1 kit, Rfl: 0 .  Brinzolamide-Brimonidine (SIMBRINZA) 1-0.2 % SUSP, Apply to eye., Disp: , Rfl:  .  cloNIDine (CATAPRES - DOSED IN MG/24 HR) 0.3 mg/24hr patch, Place 0.3 mg onto the skin once a week., Disp: , Rfl:  .  doxycycline (VIBRA-TABS) 100 MG tablet, Take 1 tablet (100 mg total) by mouth 2 (two) times daily., Disp: 20 tablet, Rfl: 0 .  glucose blood (ACCU-CHEK GUIDE) test strip, Use as  instructed 4 x daily. E11.65, Disp: 150 each, Rfl: 5 .  hydrALAZINE (APRESOLINE) 25 MG tablet, Take 25 mg by mouth 2 (two) times daily., Disp: , Rfl:  .  hydrOXYzine (ATARAX/VISTARIL) 25 MG tablet, Take 25 mg by mouth 2 (two) times daily as needed., Disp: , Rfl:  .  Insulin Glargine (LANTUS SOLOSTAR) 100 UNIT/ML Solostar Pen, Inject 30 Units into the skin daily., Disp: 5 pen, Rfl: 2 .  Insulin Pen Needle (B-D ULTRAFINE III SHORT PEN) 31G X 8 MM MISC, 1 each by Does not apply route as directed., Disp: 100 each, Rfl: 3 .  Multiple Vitamin (MULTIVITAMIN) capsule, Take 1 capsule by mouth daily., Disp: , Rfl:  .  mupirocin ointment (BACTROBAN) 2 %, Apply 1 application topically 2 (two) times daily., Disp: 30 g, Rfl: 2 .  simethicone (MYLICON) 80 MG chewable tablet, Chew 80 mg by mouth every 6 (six) hours as needed for flatulence., Disp: , Rfl:  .  sodium bicarbonate 650 MG tablet, Take 1,300 mg by mouth 2 (two) times daily., Disp: , Rfl:  .  tacrolimus (PROGRAF) 1 MG capsule, Take 2 mg by mouth 2 (two) times daily. 2 capsules every morning, 2 capsules in the evening, Disp: , Rfl:  .  Vitamin D, Ergocalciferol, (DRISDOL) 50000 units CAPS capsule, Take 50,000 Units  by mouth every 30 (thirty) days., Disp: , Rfl:   No Known Allergies       Objective:  Physical Exam  General: AAO x3, NAD  Dermatological: On the right foot submetatarsal 1 is ulceration with surrounding hyperkeratotic underlying macerated tissue.  After debridement the wound measures 1.6 x 1 x 0.6 cm.  The wound does not probe to bone although is getting close to the sesamoids.  There is no surrounding erythema, ascending cellulitis however there is edema to the right foot mild increase in warmth.  There is no fluctuation or crepitation or any malodor.  No other open lesions are identified bilaterally.  Vascular: DP, PT pulses decreased bilaterally.  Neruologic: Sensation decreased with Derrel Nip monofilament.   Musculoskeletal:  Prominent metatarsal heads plantarly with atrophy fat pad.  Muscular strength 5/5 in all groups tested bilateral.  Gait: Unassisted, Nonantalgic.       Assessment:   Right foot submetatarsal 1 ulceration, cellulitis     Plan:  -Treatment options discussed including all alternatives, risks, and complications -Etiology of symptoms were discussed -X-rays were obtained and reviewed with the patient.  Arthritic changes present the first MPJ and 3 lucencies in the first MPJ area.  This could be consistent with the arthritis however given the ulceration concern for possible osteomyelitis. -I sharply debrided the wound utilizing tissue nippers all 312 blade scalpel without any complications to healthy, bleeding, granular tissue. -We can use mupirocin ointment dressing changes daily.  Also will start doxycycline.  I dispensed a surgical shoe with offloading pad, Pegassist.  -We are going to check blood work including ESR, CRP, CBC, BMP -Given decreased pulses ordering an arterial duplex and vascular consult -Watch for signs or symptoms of worsening infection to the emergency room immediately should any occur.  Return in about 10 days (around 03/11/2018).  Repeat x-rays next appointment.  Continue we will get an MRI  Trula Slade DPM

## 2018-03-08 ENCOUNTER — Ambulatory Visit: Payer: Self-pay | Admitting: "Endocrinology

## 2018-03-08 ENCOUNTER — Other Ambulatory Visit: Payer: Self-pay

## 2018-03-08 ENCOUNTER — Ambulatory Visit (INDEPENDENT_AMBULATORY_CARE_PROVIDER_SITE_OTHER): Payer: BLUE CROSS/BLUE SHIELD | Admitting: Podiatry

## 2018-03-08 ENCOUNTER — Ambulatory Visit (HOSPITAL_COMMUNITY)
Admission: RE | Admit: 2018-03-08 | Payer: BLUE CROSS/BLUE SHIELD | Source: Ambulatory Visit | Attending: Podiatry | Admitting: Podiatry

## 2018-03-08 ENCOUNTER — Telehealth: Payer: Self-pay | Admitting: Podiatry

## 2018-03-08 DIAGNOSIS — I70235 Atherosclerosis of native arteries of right leg with ulceration of other part of foot: Secondary | ICD-10-CM

## 2018-03-08 DIAGNOSIS — L97512 Non-pressure chronic ulcer of other part of right foot with fat layer exposed: Secondary | ICD-10-CM

## 2018-03-08 DIAGNOSIS — E0843 Diabetes mellitus due to underlying condition with diabetic autonomic (poly)neuropathy: Secondary | ICD-10-CM

## 2018-03-08 MED ORDER — DOXYCYCLINE HYCLATE 100 MG PO TABS: 100.0000 mg | ORAL_TABLET | Freq: Two times a day (BID) | ORAL | 0 refills | Status: DC

## 2018-03-08 NOTE — Telephone Encounter (Signed)
Left message to call concerning the status of his wound.

## 2018-03-08 NOTE — Telephone Encounter (Signed)
Left message to call concerning the status of the wound and possible appt today.

## 2018-03-08 NOTE — Telephone Encounter (Signed)
Called pt and he spoke then the line went dead. Called back immediately and left voicemail for pt to call to discuss wound and possible appt.

## 2018-03-10 ENCOUNTER — Inpatient Hospital Stay (HOSPITAL_COMMUNITY)
Admission: RE | Admit: 2018-03-10 | Payer: BLUE CROSS/BLUE SHIELD | Source: Ambulatory Visit | Attending: Podiatry | Admitting: Podiatry

## 2018-03-11 ENCOUNTER — Emergency Department (HOSPITAL_COMMUNITY): Payer: BLUE CROSS/BLUE SHIELD

## 2018-03-11 ENCOUNTER — Encounter (HOSPITAL_COMMUNITY): Payer: Self-pay

## 2018-03-11 DIAGNOSIS — N189 Chronic kidney disease, unspecified: Secondary | ICD-10-CM | POA: Insufficient documentation

## 2018-03-11 DIAGNOSIS — Z79899 Other long term (current) drug therapy: Secondary | ICD-10-CM | POA: Insufficient documentation

## 2018-03-11 DIAGNOSIS — S4991XA Unspecified injury of right shoulder and upper arm, initial encounter: Secondary | ICD-10-CM | POA: Diagnosis present

## 2018-03-11 DIAGNOSIS — S4361XA Sprain of right sternoclavicular joint, initial encounter: Secondary | ICD-10-CM | POA: Diagnosis not present

## 2018-03-11 DIAGNOSIS — E1122 Type 2 diabetes mellitus with diabetic chronic kidney disease: Secondary | ICD-10-CM | POA: Diagnosis not present

## 2018-03-11 DIAGNOSIS — Y92411 Interstate highway as the place of occurrence of the external cause: Secondary | ICD-10-CM | POA: Diagnosis not present

## 2018-03-11 DIAGNOSIS — Y999 Unspecified external cause status: Secondary | ICD-10-CM | POA: Insufficient documentation

## 2018-03-11 DIAGNOSIS — Y9389 Activity, other specified: Secondary | ICD-10-CM | POA: Diagnosis not present

## 2018-03-11 DIAGNOSIS — Z940 Kidney transplant status: Secondary | ICD-10-CM | POA: Diagnosis not present

## 2018-03-11 DIAGNOSIS — Z794 Long term (current) use of insulin: Secondary | ICD-10-CM | POA: Diagnosis not present

## 2018-03-11 DIAGNOSIS — I129 Hypertensive chronic kidney disease with stage 1 through stage 4 chronic kidney disease, or unspecified chronic kidney disease: Secondary | ICD-10-CM | POA: Insufficient documentation

## 2018-03-11 LAB — WOUND CULTURE
MICRO NUMBER:: 91232416
RESULT: NO GROWTH
SPECIMEN QUALITY:: ADEQUATE
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
## 2018-03-12 ENCOUNTER — Ambulatory Visit: Payer: Self-pay | Admitting: Podiatry

## 2018-03-12 ENCOUNTER — Emergency Department (HOSPITAL_COMMUNITY)
Admission: EM | Admit: 2018-03-12 | Discharge: 2018-03-12 | Disposition: A | Discharge status: Home / Self Care | Payer: BLUE CROSS/BLUE SHIELD | Attending: Emergency Medicine | Admitting: Emergency Medicine

## 2018-03-12 ENCOUNTER — Emergency Department (HOSPITAL_COMMUNITY): Payer: BLUE CROSS/BLUE SHIELD

## 2018-03-12 DIAGNOSIS — S4361XA Sprain of right sternoclavicular joint, initial encounter: Secondary | ICD-10-CM | POA: Diagnosis not present

## 2018-03-12 MED ORDER — TRAMADOL HCL 50 MG PO TABS: 50.0000 mg | ORAL_TABLET | Freq: Four times a day (QID) | ORAL | 0 refills | Status: DC | PRN

## 2018-03-12 NOTE — Discharge Instructions (Addendum)
Apply ice for thirty minutes at a time, four times a day.  Take acetaminophen as needed for pain, take tramadol for pain not relieved by acetaminophen.

## 2018-03-12 NOTE — Progress Notes (Signed)
   Subjective:  57 year old male presenting today for follow up evaluation of an ulceration of the sub-first MPJ of the right foot. He states the wound looks very "wet" and almost green. He has been using the Silvadene cream as directed. There are no modifying factors noted. Patient is here for further evaluation and treatment.   Past Medical History:  Diagnosis Date  . Chronic kidney disease    Awaiting transplant  . Diabetes ([place])   . Diabetes mellitus without complication ([place])   . Hypertension   . Renal disorder       Objective/Physical Exam General: The patient is alert and oriented x3 in no acute distress.  Dermatology:  Wound #1 noted to the right sub-first MTPJ measuring 1.0 x 0.7 x 0.3 cm (LxWxD).   To the noted ulceration(s), there is no eschar. There is a moderate amount of slough, fibrin, and necrotic tissue noted. Granulation tissue and wound base is red. There is a minimal amount of serosanguineous drainage noted. There is no exposed bone muscle-tendon ligament or joint. There is no malodor. Periwound integrity is intact. Skin is warm, dry and supple bilateral lower extremities.  Vascular: Palpable pedal pulses bilaterally. No edema or erythema noted. Capillary refill within normal limits.  Neurological: Epicritic and protective threshold diminished bilaterally.   Musculoskeletal Exam: Range of motion within normal limits to all pedal and ankle joints bilateral. Muscle strength 5/5 in all groups bilateral.      Assessment: #1 ulceration noted to the right sub-first MTPJ secondary to diabetes mellitus #2 diabetes mellitus w/ peripheral neuropathy   Plan of Care:  #1 Patient was evaluated. #2 Medically necessary excisional debridement including muscle and deep fascial tissue was performed using a tissue nipper and a chisel blade. Excisional debridement of all the necrotic nonviable tissue down to healthy bleeding viable tissue was performed with post-debridement  measurements same as pre-. #3 The wound was cleansed and dry sterile dressing applied. #4 Arterial studies set for Wednesday March 10, 2018.  #5 Recommended Betadine periwound and continue using Mupirocin ointment daily.  #6 Culture taken from wound today.  #7 Refill prescription for Doxycycline provided to patient.  #8 Continue using post op shoe.  #9 Return to clinic in 2 weeks with Dr. Jacqualyn Posey.    Edrick Kins, DPM [facility]  Dr. Edrick Kins, [address]                                        [place], [place] [address]                Office [phone]  Fax [phone]
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
## 2018-03-17 ENCOUNTER — Ambulatory Visit: Payer: Self-pay | Admitting: "Endocrinology

## 2018-03-22 ENCOUNTER — Ambulatory Visit (INDEPENDENT_AMBULATORY_CARE_PROVIDER_SITE_OTHER): Payer: BLUE CROSS/BLUE SHIELD | Admitting: Podiatry

## 2018-03-22 ENCOUNTER — Encounter: Payer: Self-pay | Admitting: Podiatry

## 2018-03-22 VITALS — Temp 96.2°F

## 2018-03-22 DIAGNOSIS — L97512 Non-pressure chronic ulcer of other part of right foot with fat layer exposed: Secondary | ICD-10-CM | POA: Diagnosis not present

## 2018-03-28 NOTE — Progress Notes (Signed)
Subjective: 57 year old male presents the office today for follow-up evaluation of the wound on the bottom of his right foot.  He states the area is doing better he denies any redness or drainage or any swelling and denies any pus.  No pain.  He has no other concerns. Denies any systemic complaints such as fevers, chills, nausea, vomiting. No acute changes since last appointment, and no other complaints at this time.   Objective: AAO x3, NAD DP/PT pulses palpable bilaterally, CRT less than 3 seconds On the right foot submetatarsal 1 is a ulceration with surrounding hyperkeratotic tissue.  The wound base appears to be granular.  There is no swelling erythema, ascending cellulitis.  There is no fluctuation or crepitation or any malodor.  After debridement of the wound measures 1.5 x 1 x 0.3 cm. No open lesions or pre-ulcerative lesions.  No pain with calf compression, swelling, warmth, erythema  Assessment: Right foot ulceration  Plan: -All treatment options discussed with the patient including all alternatives, risks, complications.  -Today I sharply debrided the wound utilizing a tissue nipper as well as a 312 blade scalpel to remove all nonviable hyperkeratotic tissue.  The wound base was debrided to healthy, granular bleeding.  I wanted to make sure he is wearing a surgical shoe with offloading all times which she is not wearing. -He states he did get his arterial studies but I cannot see the results but he states that he was told they were normal.  We will follow-up on this. -This course of antibiotics. -Continue daily dressing changes with mupirocin ointment. -Monitor for any clinical signs or symptoms of infection and directed to call the office immediately should any occur or go to the ER. -Patient encouraged to call the office with any questions, concerns, change in symptoms.   Return in about 1 week (around 03/29/2018).  Trula Slade DPM

## 2018-03-29 ENCOUNTER — Encounter: Payer: Self-pay | Admitting: Podiatry

## 2018-03-29 ENCOUNTER — Ambulatory Visit (INDEPENDENT_AMBULATORY_CARE_PROVIDER_SITE_OTHER): Payer: BLUE CROSS/BLUE SHIELD | Admitting: Podiatry

## 2018-03-29 ENCOUNTER — Telehealth: Payer: Self-pay | Admitting: *Deleted

## 2018-03-29 DIAGNOSIS — B351 Tinea unguium: Secondary | ICD-10-CM | POA: Diagnosis not present

## 2018-03-29 DIAGNOSIS — L97512 Non-pressure chronic ulcer of other part of right foot with fat layer exposed: Secondary | ICD-10-CM

## 2018-03-29 DIAGNOSIS — M79675 Pain in left toe(s): Secondary | ICD-10-CM

## 2018-03-29 DIAGNOSIS — M79674 Pain in right toe(s): Secondary | ICD-10-CM

## 2018-03-29 DIAGNOSIS — E0843 Diabetes mellitus due to underlying condition with diabetic autonomic (poly)neuropathy: Secondary | ICD-10-CM | POA: Diagnosis not present

## 2018-03-29 NOTE — Telephone Encounter (Signed)
-----   Message from Trula Slade, DPM sent at 03/28/2018 10:00 AM EST ----- He states that he had his arterial studies done in [place] (?). Can you see if we can get the results? Thanks.

## 2018-03-29 NOTE — Telephone Encounter (Signed)
Left message requesting call back with information concerning the cardiovascular group that performed his circulation test.

## 2018-03-31 NOTE — Telephone Encounter (Signed)
Left message for pt to call with the cardiovascular doctor that performed his circulation test.

## 2018-03-31 NOTE — Progress Notes (Signed)
Subjective: 57 year old male presents the office today for follow-up evaluation of the wound on the bottom of his right foot.  He thinks that the wound is getting better.  He denies any drainage or pus coming from the area he has been applying antibiotic ointment dressing to the wound daily.  No increase in swelling to the foot.  Is also asking for his nails be trimmed as they are thick and elongated and he cannot trim them himself there is red and cause irritation to the skin.Denies any systemic complaints such as fevers, chills, nausea, vomiting. No acute changes since last appointment, and no other complaints at this time.   Objective: AAO x3, NAD DP/PT pulses palpable bilaterally, CRT less than 3 seconds On the right foot submetatarsal 1 is a ulceration with surrounding hyperkeratotic tissue.  The wound base appears to be granular. There is no fluctuation or crepitation or any malodor.  After debridement of the wound measures 1.3 x 1 x 0.2 cm.  Does appear to be filling in.  There is no surrounding erythema, ascending cellulitis there is no signs of infection identified today. Nails are hypertrophic, dystrophic, brittle, discolored, elongated 10. No surrounding redness or drainage. Tenderness nails 1-5 bilaterally. No open lesions or pre-ulcerative lesions are identified today. No open lesions or pre-ulcerative lesions.  No pain with calf compression, swelling, warmth, erythema  Assessment: Right foot ulceration, symptomatic onychomycosis  Plan: -All treatment options discussed with the patient including all alternatives, risks, complications.  -Today I sharply debrided the wound utilizing a tissue nipper as well as a 312 blade scalpel to remove all nonviable hyperkeratotic tissue.  The wound base was debrided to healthy, granular bleeding.  I wanted to make sure he is wearing a surgical shoe with offloading all times which she is not wearing.  Finish course of antibiotics and continue dressing  changes daily.  Will switch to Medihoney. Continue offloading at all times. -Nails debrided x10 without any complications or bleeding. -This course of antibiotics. -Monitor for any clinical signs or symptoms of infection and directed to call the office immediately should any occur or go to the ER. -Patient encouraged to call the office with any questions, concerns, change in symptoms.    Trula Slade DPM

## 2018-04-05 ENCOUNTER — Encounter: Payer: Self-pay | Admitting: Podiatry

## 2018-04-05 ENCOUNTER — Ambulatory Visit (INDEPENDENT_AMBULATORY_CARE_PROVIDER_SITE_OTHER): Payer: BLUE CROSS/BLUE SHIELD | Admitting: Podiatry

## 2018-04-05 VITALS — BP 164/75 | HR 60 | Temp 97.7°F | Resp 18

## 2018-04-05 DIAGNOSIS — E0843 Diabetes mellitus due to underlying condition with diabetic autonomic (poly)neuropathy: Secondary | ICD-10-CM

## 2018-04-05 DIAGNOSIS — L97512 Non-pressure chronic ulcer of other part of right foot with fat layer exposed: Secondary | ICD-10-CM | POA: Diagnosis not present

## 2018-04-07 NOTE — Progress Notes (Signed)
Subjective: 57 year old male presents the office today for follow-up evaluation of the wound on the bottom of his right foot.  He has been continue with daily dressing changes. He  He has been putting the medihoney on the wound daily.  Denies any drainage or pus or any swelling or redness from the area.  Is been wearing the surgical shoe with offloading pad majority the time.  Overall he feels well and denies any systemic complaints including fevers, chills, nausea, vomiting. No acute changes since last appointment, and no other complaints at this time.   Objective: AAO x3, NAD DP/PT pulses palpable bilaterally, CRT less than 3 seconds On the right foot submetatarsal 1 is a ulceration with surrounding hyperkeratotic tissue.  The wound base appears to be granular. There is no fluctuation or crepitation or any malodor.  After debridement of the wound measures 1.3 x 1 x 0.3 cm.  Overall still the same.  There is no probing to bone, undermining or tunneling.  There is no surrounding erythema, ascending cellulitis.  No fluctuation or crepitation or any malodor. No open lesions or pre-ulcerative lesions.  No pain with calf compression, swelling, warmth, erythema      Assessment: Right foot ulceration, symptomatic onychomycosis  Plan: -All treatment options discussed with the patient including all alternatives, risks, complications.  -Today I sharply debrided the wound utilizing a tissue nipper as well as a 312 blade scalpel to remove all nonviable hyperkeratotic tissue.  The wound base was debrided to healthy, granular bleeding.  The wound is down to the fat layer.  There is no exposed bone or tendon.  We are going to switch to using Prisma to the wound daily.  I did give him some of the sedating use and we discussed how to apply it.  Clean wound with saline as well as applied his daily followed by dry sterile dressing.  Continue surgical shoe and offloading at all times. -Monitor for any clinical  signs or symptoms of infection and directed to call the office immediately should any occur or go to the ER. -Patient encouraged to call the office with any questions, concerns, change in symptoms.   Trula Slade DPM

## 2018-04-08 ENCOUNTER — Ambulatory Visit: Payer: BLUE CROSS/BLUE SHIELD | Admitting: Nutrition

## 2018-04-12 ENCOUNTER — Ambulatory Visit: Payer: BLUE CROSS/BLUE SHIELD | Admitting: Podiatry

## 2018-05-11 ENCOUNTER — Telehealth: Payer: Self-pay | Admitting: Cardiovascular Disease

## 2018-05-11 NOTE — Telephone Encounter (Signed)
Called patient and LVM to call back and schedule an appointment with Dr. Gwenlyn Found.

## 2018-05-13 ENCOUNTER — Ambulatory Visit: Payer: BLUE CROSS/BLUE SHIELD | Admitting: Nutrition

## 2018-05-17 ENCOUNTER — Ambulatory Visit (INDEPENDENT_AMBULATORY_CARE_PROVIDER_SITE_OTHER): Payer: Self-pay

## 2018-05-17 ENCOUNTER — Ambulatory Visit (INDEPENDENT_AMBULATORY_CARE_PROVIDER_SITE_OTHER): Payer: BLUE CROSS/BLUE SHIELD | Admitting: Podiatry

## 2018-05-17 ENCOUNTER — Telehealth: Payer: Self-pay | Admitting: *Deleted

## 2018-05-17 DIAGNOSIS — E0843 Diabetes mellitus due to underlying condition with diabetic autonomic (poly)neuropathy: Secondary | ICD-10-CM

## 2018-05-17 DIAGNOSIS — L97512 Non-pressure chronic ulcer of other part of right foot with fat layer exposed: Secondary | ICD-10-CM

## 2018-05-17 MED ORDER — DOXYCYCLINE HYCLATE 100 MG PO TABS: 100.0000 mg | ORAL_TABLET | Freq: Two times a day (BID) | ORAL | 0 refills | Status: DC

## 2018-05-17 NOTE — Telephone Encounter (Addendum)
Dr. Jacqualyn Posey ordered Prisma, conforming roll gauze and sterile 4x4 gauze with gloves and saline for daily dressing changes to right 1st metatarsal sub L97.512 with low exudate measuring 1.3 x 1.0 x 0.5cm from [facility]. Faxed orders and demographics to [facility].

## 2018-05-25 ENCOUNTER — Ambulatory Visit: Payer: BLUE CROSS/BLUE SHIELD | Admitting: Podiatry

## 2018-05-25 NOTE — Progress Notes (Signed)
Subjective: 57 year old male presents the office today for follow-up evaluation of the wound on the bottom of his right foot. Overall he thinks that he is doing better.  He was using the Prisma and this was helping quite a bit but he ran out so he has been using the honey.  Since using the [place] he thinks the wound is gotten somewhat deeper.  Is been wearing a regular shoe.  Denies any drainage or pus increase in swelling or redness of the foot.  He has no other concerns today. Denies any systemic complaints including fevers, chills, nausea, vomiting. No acute changes since last appointment, and no other complaints at this time.   Objective: AAO x3, NAD DP/PT pulses palpable bilaterally, CRT less than 3 seconds On the right foot submetatarsal 1 is a ulceration with surrounding hyperkeratotic tissue.  Overall the wound does appear to be somewhat deeper.  Measures approximate 1.3 x 1 x 0.8 cm but there is no probing to bone, undermining or tunneling.  There is no surrounding erythema, ascending cellulitis.  There is no fluctuation or crepitation or any malodor.  No other open lesions identified at this time. No open lesions or pre-ulcerative lesions.  No pain with calf compression, swelling, warmth, erythema  Assessment: Right foot ulceration  Plan: -All treatment options discussed with the patient including all alternatives, risks, complications.  -Today I sharply debrided the wound utilizing a tissue nipper as well as a 312 blade scalpel to remove all nonviable hyperkeratotic tissue.  The wound base was debrided to healthy, granular bleeding.  The wound is down to the fat layer  We will continue with Prisma dressing changes daily and I did order this today through [facility].  -Recommend offloading shoe at all times to help take pressure off the wound. -Monitor for any clinical signs or symptoms of infection and directed to call the office immediately should any occur or go to the ER.  Return in  about 1 week (around 05/24/2018).  Trula Slade DPM

## 2018-06-02 ENCOUNTER — Ambulatory Visit: Payer: BLUE CROSS/BLUE SHIELD | Admitting: Cardiovascular Disease

## 2018-06-10 ENCOUNTER — Ambulatory Visit (INDEPENDENT_AMBULATORY_CARE_PROVIDER_SITE_OTHER): Payer: BLUE CROSS/BLUE SHIELD

## 2018-06-10 ENCOUNTER — Ambulatory Visit (INDEPENDENT_AMBULATORY_CARE_PROVIDER_SITE_OTHER): Payer: BLUE CROSS/BLUE SHIELD | Admitting: Podiatry

## 2018-06-10 DIAGNOSIS — E0843 Diabetes mellitus due to underlying condition with diabetic autonomic (poly)neuropathy: Secondary | ICD-10-CM

## 2018-06-10 DIAGNOSIS — L97512 Non-pressure chronic ulcer of other part of right foot with fat layer exposed: Secondary | ICD-10-CM

## 2018-06-11 ENCOUNTER — Ambulatory Visit: Payer: BLUE CROSS/BLUE SHIELD | Admitting: Podiatry

## 2018-06-13 NOTE — Progress Notes (Addendum)
Subjective: 58 year old male presents the office today for follow-up evaluation of the wound on the bottom of his right foot.  He states that overall he feels that the wound is not healing is getting deeper.  He is not wearing the offloading shoe he presents today wearing a regular shoe.  He has been doing dressing changes with Prisma.  Overall he denies any fevers, chills, nausea, vomiting.  Denies any calf pain, chest pain, shortness of breath.  He has no other concerns.  Objective: AAO x3, NAD-presents today with his wife DP/PT pulses palpable bilaterally, CRT less than 3 seconds On the right foot submetatarsal 1 is a ulceration with surrounding hyperkeratotic tissue.  Overall wound appears to about the same size as last appointment but is getting 1 central areas.  Close to the sesamoids.  There is a granular wound base there is no fluctuation or crepitation there is no malodor.  There is no drainage or pus.  No surrounding erythema, ascending cellulitis. No open lesions or pre-ulcerative lesions.  No pain with calf compression, swelling, warmth, erythema  Assessment: Right foot ulceration  Plan: -All treatment options discussed with the patient including all alternatives, risks, complications.  -X-rays were obtained and reviewed.  Arthritic changes present the first MPJ is difficult to evaluate.  There is some radiolucency noted on the AP view but this could be overlapping the wound but cannot exclude osteomyelitis.  No soft tissue emphysema. -I would order an MRI of the right foot to rule out osteomyelitis. -Today I sharply debrided the wound utilizing a tissue nipper as well as a 312 blade scalpel to remove all nonviable hyperkeratotic tissue.  The wound base was debrided to healthy, granular bleeding.  Continue daily dressing changes for now.  I do want to do a total contact casting to come in tomorrow to get this done. -He had arterial studies done in [place]. He and his wife state he was  told the circulation was "fine". However I have not been able to see this and he has not been able to tell me where he had these done so I can get a copy. He is doing to try to find out tonight.  -Recommend offloading shoe at all times to help take pressure off the wound for now.  -Monitor for any clinical signs or symptoms of infection and directed to call the office immediately should any occur or go to the ER.  Return tomorrow for total contact cast.  *order CBC, ESR, CRP, A1c next appointment   Trula Slade DPM

## 2018-06-14 ENCOUNTER — Ambulatory Visit (INDEPENDENT_AMBULATORY_CARE_PROVIDER_SITE_OTHER): Payer: BLUE CROSS/BLUE SHIELD | Admitting: Podiatry

## 2018-06-14 ENCOUNTER — Encounter: Payer: Self-pay | Admitting: Podiatry

## 2018-06-14 ENCOUNTER — Telehealth: Payer: Self-pay | Admitting: *Deleted

## 2018-06-14 VITALS — HR 60 | Temp 97.4°F | Resp 16

## 2018-06-14 DIAGNOSIS — L97512 Non-pressure chronic ulcer of other part of right foot with fat layer exposed: Secondary | ICD-10-CM

## 2018-06-14 DIAGNOSIS — L03119 Cellulitis of unspecified part of limb: Secondary | ICD-10-CM

## 2018-06-14 MED ORDER — DOXYCYCLINE HYCLATE 100 MG PO TABS: 100.0000 mg | ORAL_TABLET | Freq: Two times a day (BID) | ORAL | 0 refills | Status: DC

## 2018-06-14 NOTE — Telephone Encounter (Signed)
-----  Message from Trula Slade, DPM sent at 06/13/2018  1:26 PM EST ----- Can you order an MRI of the right foot to rule out osteomyelitis? Wound is sub met  1 and not healing. Thanks.

## 2018-06-14 NOTE — Telephone Encounter (Signed)
Orders to J. Quintana, RN for pre-cert, faxed to [facility]. 

## 2018-06-15 ENCOUNTER — Telehealth: Payer: Self-pay | Admitting: *Deleted

## 2018-06-15 DIAGNOSIS — L97512 Non-pressure chronic ulcer of other part of right foot with fat layer exposed: Secondary | ICD-10-CM

## 2018-06-15 DIAGNOSIS — E0843 Diabetes mellitus due to underlying condition with diabetic autonomic (poly)neuropathy: Secondary | ICD-10-CM

## 2018-06-15 LAB — CBC WITH DIFFERENTIAL/PLATELET
Absolute Monocytes: 396 cells/uL (ref 200–950)
BASOS ABS: 29 {cells}/uL (ref 0–200)
Basophils Relative: 0.8 %
EOS ABS: 140 {cells}/uL (ref 15–500)
EOS PCT: 3.9 %
HCT: 43 % (ref 38.5–50.0)
Hemoglobin: 13.7 g/dL (ref 13.2–17.1)
Lymphs Abs: 1102 cells/uL (ref 850–3900)
MCH: 26.2 pg — ABNORMAL LOW (ref 27.0–33.0)
MCHC: 31.9 g/dL — ABNORMAL LOW (ref 32.0–36.0)
MCV: 82.4 fL (ref 80.0–100.0)
MPV: 13.2 fL — ABNORMAL HIGH (ref 7.5–12.5)
Monocytes Relative: 11 %
Neutro Abs: 1933 cells/uL (ref 1500–7800)
Neutrophils Relative %: 53.7 %
Platelets: 145 10*3/uL (ref 140–400)
RBC: 5.22 10*6/uL (ref 4.20–5.80)
RDW: 13.2 % (ref 11.0–15.0)
Total Lymphocyte: 30.6 %
WBC: 3.6 10*3/uL — ABNORMAL LOW (ref 3.8–10.8)

## 2018-06-15 LAB — HEMOGLOBIN A1C: Hgb A1c MFr Bld: >14 % of total Hgb — ABNORMAL HIGH (ref <5.7)

## 2018-06-15 LAB — C-REACTIVE PROTEIN: CRP: 1 mg/L (ref <8.0)

## 2018-06-15 LAB — SEDIMENTATION RATE: Sed Rate: 19 mm/h (ref 0–20)

## 2018-06-15 NOTE — Telephone Encounter (Signed)
Left message for pt to call urgently to discuss lab results.

## 2018-06-15 NOTE — Telephone Encounter (Signed)
-----   Message from Trula Slade, DPM sent at 06/15/2018  8:15 AM EST ----- Val- please let him know that his blood work form an infection standpoint looks good. However his A1c is extremely high and part of the reason the wound is not healing. It just says >14. Can you see who his PCP is so we can fax them the results. He needs to get his blood sugar under control. If he does not have an endocrinologist we can put in a referral for him.

## 2018-06-17 ENCOUNTER — Encounter: Payer: Self-pay | Admitting: *Deleted

## 2018-06-17 NOTE — Telephone Encounter (Deleted)
-----   Message from Trula Slade, DPM sent at 06/15/2018  8:15 AM EST ----- Val- please let him know that his blood work form an infection standpoint looks good. However his A1c is extremely high and part of the reason the wound is not healing. It just says >14. Can you see who his PCP is so we can fax them the results. He needs to get his blood sugar under control. If he does not have an endocrinologist we can put in a referral for him.

## 2018-06-17 NOTE — Telephone Encounter (Signed)
Left message informing pt, due to the urgency of the message I was going to leave the results and Dr. Leigh Aurora orders on his voicmail, informed of Dr. Leigh Aurora 06/15/2018 8:15am review of results. Mailed letter to pt with Dr. Leigh Aurora review of results and orders.

## 2018-06-18 NOTE — Telephone Encounter (Signed)
Left message informing pt of Dr. Leigh Aurora review of results and to call with the requested information.

## 2018-06-18 NOTE — Telephone Encounter (Signed)
Pt returned call

## 2018-06-21 ENCOUNTER — Encounter: Payer: Self-pay | Admitting: Nutrition

## 2018-06-21 ENCOUNTER — Ambulatory Visit (INDEPENDENT_AMBULATORY_CARE_PROVIDER_SITE_OTHER): Payer: BLUE CROSS/BLUE SHIELD | Admitting: Podiatry

## 2018-06-21 ENCOUNTER — Telehealth: Payer: Self-pay

## 2018-06-21 ENCOUNTER — Other Ambulatory Visit: Payer: Self-pay | Admitting: "Endocrinology

## 2018-06-21 ENCOUNTER — Encounter: Payer: BLUE CROSS/BLUE SHIELD | Attending: "Endocrinology | Admitting: Nutrition

## 2018-06-21 ENCOUNTER — Encounter: Payer: Self-pay | Admitting: Podiatry

## 2018-06-21 VITALS — Ht 70.0 in | Wt 221.0 lb

## 2018-06-21 DIAGNOSIS — IMO0002 Reserved for concepts with insufficient information to code with codable children: Secondary | ICD-10-CM

## 2018-06-21 DIAGNOSIS — E1165 Type 2 diabetes mellitus with hyperglycemia: Secondary | ICD-10-CM | POA: Diagnosis present

## 2018-06-21 DIAGNOSIS — E118 Type 2 diabetes mellitus with unspecified complications: Secondary | ICD-10-CM | POA: Diagnosis not present

## 2018-06-21 DIAGNOSIS — E0843 Diabetes mellitus due to underlying condition with diabetic autonomic (poly)neuropathy: Secondary | ICD-10-CM

## 2018-06-21 DIAGNOSIS — I10 Essential (primary) hypertension: Secondary | ICD-10-CM

## 2018-06-21 DIAGNOSIS — E6609 Other obesity due to excess calories: Secondary | ICD-10-CM | POA: Diagnosis present

## 2018-06-21 DIAGNOSIS — E782 Mixed hyperlipidemia: Secondary | ICD-10-CM | POA: Diagnosis present

## 2018-06-21 DIAGNOSIS — L97512 Non-pressure chronic ulcer of other part of right foot with fat layer exposed: Secondary | ICD-10-CM

## 2018-06-21 DIAGNOSIS — Z6831 Body mass index (BMI) 31.0-31.9, adult: Secondary | ICD-10-CM | POA: Diagnosis present

## 2018-06-21 MED ORDER — INSULIN GLARGINE 100 UNIT/ML SOLOSTAR PEN: 40.0000 [IU] | PEN_INJECTOR | Freq: Every day | SUBCUTANEOUS | 0 refills | Status: DC

## 2018-06-21 NOTE — Telephone Encounter (Signed)
Somebody has left me a message twice needing to speak to me so I'm returning their call.

## 2018-06-21 NOTE — Telephone Encounter (Signed)
Pt called states he is returning my call. I told pt Dr. Jacqualyn Posey had reviewed his results and wanted to refer him to an endocrinologist. Pt asked what that was, I informed it was a doctor that cared for diabetic pts. Pt states that is who he just saw and wanted to know if we could see him now before 2:45pm. I told pt that he could come and wait but may still be seen at 2:45pm. Pt states understandig. I told pt I would like a copy of the card he has for his endocrinologist.

## 2018-06-21 NOTE — Progress Notes (Signed)
  Medical Nutrition Therapy:  Appt start time: 1000  end time:  1030. Here with his wife. His wife does most of the cooking. PMH: Obesity, HTN, Kidney transplant. CKD. Sees specialist in [place], [place]. Assessment:  Primary concerns today: Dm ESRD with kidney transplant,mixed hyperlipidemia.A1C > 14%. Works at an [employer]. Eats whatever he wants right now. Says his insulin isn't helping. Was suppose to get lab work done and check BS 4 x day and return visit to Dr. Dorris Fetch but he didn't keep appt. Will reschedule appt today. 40 units of Lantus once a day. Glipizide. Not following a low salt diet like he should be for CKD/HTN.  FBS: 300's. Twice a day 300's at night. Willing to make changes and improve blood sugars.    Lab Results  Component Value Date   HGBA1C >14.0 (H) 06/14/2018   CMP Latest Ref Rng & Units 12/11/2015 12/10/2015 03/13/2014  Glucose 65 - 99 mg/dL 135(H) 134(H) 217(H)  BUN 6 - 20 mg/dL 36(H) 46(H) 45(H)  Creatinine 0.61 - 1.24 mg/dL 1.34(H) 1.88(H) 9.58(H)  Sodium 135 - 145 mmol/L 141 138 142  Potassium 3.5 - 5.1 mmol/L 4.5 4.9 4.1  Chloride 101 - 111 mmol/L 116(H) 113(H) 101  CO2 22 - 32 mmol/L 17(L) 17(L) 30  Calcium 8.9 - 10.3 mg/dL 9.3 9.7 8.2(L)  Total Protein 6.5 - 8.1 g/dL 6.2(L) 7.7 -  Total Bilirubin 0.3 - 1.2 mg/dL 0.3 0.6 -  Alkaline Phos 38 - 126 U/L 157(H) 184(H) -  AST 15 - 41 U/L 16 21 -  ALT 17 - 63 U/L 24 26 -  Preferred Learning Style:   Auditory       Learning Readiness:   Ready  Change in progress   MEDICATIONS:    DIETARY INTAKE:  24-hr recall:  Eats 2-3 meals per day. Misc snacks.  Usual physical activity: ADL  Estimated energy needs: 1600  calories 180 g carbohydrates 120 g protein 44 g fat  Progress Towards Goal(s):  In progress.   Nutritional Diagnosis:  NB-1.1 Food and nutrition-related knowledge deficit As related to Diabetes and CKD.  As evidenced by  A1C and Kidney transplant.    Intervention:  Nutrition and  Diabetes education provided on My Plate, CHO counting, meal planning, portion sizes, timing of meals, avoiding snacks between meals unless having a low blood sugar, target ranges for A1C and blood sugars, signs/symptoms and treatment of hyper/hypoglycemia, monitoring blood sugars, taking medications as prescribed, benefits of exercising 30 minutes per day and prevention of complications of DM. Marland KitchenGoals Eat three meals per day  Drink only water  Increase fresh fruits and vegetables.  Take 40 units Tougeo daily. Test blood sugars 4 times per day  Teaching Method Utilized:  Visual Auditory Hands on  Handouts given during visit include:  The Plate Method   Meal Plan Card   Barriers to learning/adherence to lifestyle change: none  Demonstrated degree of understanding via:  Teach Back   Monitoring/Evaluation:  Dietary intake, exercise, , and body weight in 1 month(s).

## 2018-06-21 NOTE — Telephone Encounter (Signed)
Mr Iodice is asking for a refill on Toujeo he hasnt been seen since September 2019 he is scheduled for an appointment Monday 07/05/18 at 3:00, please advise, he has an up to date Hgb A1C in the computer from another physician.

## 2018-06-21 NOTE — Progress Notes (Signed)
Subjective: 58 year old male presents the office today for follow-up evaluation of the wound on the bottom of his right foot.  He states he has been wearing the offloading shoe and since wearing this consistently since I last saw him he feels that the wound is getting better.  Denies any drainage or pus and he feels that the wound is smaller than what it was.  He has not yet been able to find out where he had his vascular studies performed baseline continue to work on this.  Denies any drainage or pus or increased swelling or redness.  He has no fevers, chills, nausea, vomiting.  No calf pain, chest pain, shortness of breath.   Objective: AAO x3, NAD-presents today with his wife DP/PT pulses palpable bilaterally, CRT less than 3 seconds On the right foot submetatarsal 1 is a ulceration with surrounding hyperkeratotic tissue.  After debrided the wound today the wound measured 1 x 1 x 0.5 cm.  It has been filling in since I last saw him and there is no fluctuation or crepitation.  There is no drainage or pus.  No surrounding erythema, ascending cellulitis.  No malodor.   No open lesions or pre-ulcerative lesions.  No pain with calf compression, swelling, warmth, erythema  Assessment: Right foot ulceration; improvement  Plan: -All treatment options discussed with the patient including all alternatives, risks, complications.  -Overall he is doing better.  I sharply debrided the wound without any complications down to healthy, granular tissue.  We are to continue with dressing changes with Prisma every other day.  Continue Darco offloading shoe at all times.  Discussed the total contact cast but I will get an MRI and hold off on the cast until after the MRI and also since the wound is been healing. -Will order blood work today  Trula Slade DPM

## 2018-06-21 NOTE — Patient Instructions (Signed)
Goals Eat three meals per day  Drink only water  Increase fresh fruits and vegetables.  Take 40 units Tougeo daily. Test blood sugars 4 times per day

## 2018-06-22 NOTE — Telephone Encounter (Signed)
Faxed LOV and last labs to Jearld Fenton, RDN, CDE - [facility].

## 2018-06-22 NOTE — Telephone Encounter (Signed)
Dr. Jacqualyn Posey requested A1c. I informed pt of the testing at [facility]. [address], [address] in [place].

## 2018-06-22 NOTE — Addendum Note (Signed)
Addended by: Harriett Sine D on: 06/22/2018 03:35 PM   Modules accepted: Orders

## 2018-06-23 NOTE — Progress Notes (Signed)
Subjective: 58 year old male presents the office today for follow-up evaluation of the wound on the bottom of his right foot.  He states that overall he is getting better.  Denies any drainage or pus coming from the area denies any swelling or redness.  He feels that the wound is getting smaller.  He did wear the surgical shoe with the offloading peg assist.  Denies any fevers, chills, nausea, vomiting.  No calf pain, chest pain, shortness of breath.  Objective: AAO x3, NAD-presents today with his wife DP/PT pulses palpable bilaterally, CRT less than 3 seconds On the right foot submetatarsal 1 is a ulceration with surrounding hyperkeratotic tissue.  After debrided the wound today the wound measured 1 x 1 x 0.5 cm at its deepest area but along the periphery it is starting to fill in has a depth of approximate 0.2/0.3 cm in area.  Has a granular wound base there is no probing to bone, undermining or tunneling.  There is no surrounding erythema, ascending cellulitis.  There is no fluctuation crepitation or any malodor. No open lesions or pre-ulcerative lesions.  No pain with calf compression, swelling, warmth, erythema  Assessment: Right foot ulceration; without signs of infection  Plan: -All treatment options discussed with the patient including all alternatives, risks, complications.  -Wound was sharply debrided with a #312 with scalpel down to healthy, bleeding, granular tissue.  We will continue with every other day dressing changes with Prisma.  Which was applied today.  Continue with Darco offloading shoe at all times.  He does not believe that his A1c is actually over 14.  I will recheck this.  Also has an MRI pending his lab work is normal so far so I doubt osteomyelitis given normal sed rate and CRP.  We will continue to monitor.  Monitoring signs or symptoms of infection of the ER should any occur. -Also discussed with him staying out of work to allow the wound to heal.  Trula Slade  DPM

## 2018-06-24 DIAGNOSIS — L97512 Non-pressure chronic ulcer of other part of right foot with fat layer exposed: Secondary | ICD-10-CM | POA: Diagnosis not present

## 2018-06-28 ENCOUNTER — Encounter: Payer: BLUE CROSS/BLUE SHIELD | Admitting: Podiatry

## 2018-06-28 ENCOUNTER — Encounter: Payer: Self-pay | Admitting: "Endocrinology

## 2018-06-29 ENCOUNTER — Telehealth: Payer: Self-pay

## 2018-06-29 ENCOUNTER — Ambulatory Visit: Payer: BLUE CROSS/BLUE SHIELD | Admitting: Cardiovascular Disease

## 2018-06-29 NOTE — Telephone Encounter (Signed)
OPENED IN ERROR, Raymond [name] Raymond [name], CMA

## 2018-07-04 NOTE — Progress Notes (Signed)
This encounter was created in error - please disregard.

## 2018-07-05 ENCOUNTER — Ambulatory Visit: Payer: Self-pay | Admitting: "Endocrinology

## 2018-07-05 ENCOUNTER — Ambulatory Visit: Payer: BLUE CROSS/BLUE SHIELD | Admitting: Nutrition

## 2018-07-06 ENCOUNTER — Telehealth: Payer: Self-pay | Admitting: Podiatry

## 2018-07-06 NOTE — Telephone Encounter (Signed)
I missed your call so I'm calling you back. I'll keep my phone in my hand. Please call me back as soon as you can.

## 2018-07-06 NOTE — Telephone Encounter (Signed)
Unable to leave a message voice mail is full

## 2018-07-06 NOTE — Telephone Encounter (Signed)
The shoe I have on has broken again. Can I come in and get a new one?

## 2018-07-06 NOTE — Telephone Encounter (Signed)
I called pt and he states the shoe is broken and he wears a large, his wife will come in about 5:05pm to pick up the shoe.

## 2018-07-06 NOTE — Telephone Encounter (Signed)
Patient's wife came by today and got a large surgical shoe and peg assist and I explained how to prepare the peg assist and patient's wife stated that the wound was healing good and was seen two weeks ago. Lattie Haw

## 2018-07-13 ENCOUNTER — Ambulatory Visit (INDEPENDENT_AMBULATORY_CARE_PROVIDER_SITE_OTHER): Payer: BLUE CROSS/BLUE SHIELD | Admitting: Podiatry

## 2018-07-13 DIAGNOSIS — E0843 Diabetes mellitus due to underlying condition with diabetic autonomic (poly)neuropathy: Secondary | ICD-10-CM | POA: Diagnosis not present

## 2018-07-13 DIAGNOSIS — L97512 Non-pressure chronic ulcer of other part of right foot with fat layer exposed: Secondary | ICD-10-CM | POA: Diagnosis not present

## 2018-07-14 NOTE — Progress Notes (Signed)
Subjective: 58 year old male presents the office today for follow-up evaluation of the wound on the bottom of his right foot.  He states the wound is healing very nicely he wants to be released to wear a regular shoe to go back to work.  Denies any drainage or pus coming from the area he is having no pain.  No swelling to his foot.  He denies any fevers, chills, nausea, vomiting.  No calf pain, chest pain, shortness of breath.  Is no other concerns today.   Objective: AAO x3, NAD-presents today with his wife DP/PT pulses palpable bilaterally, CRT less than 3 seconds On the right foot submetatarsal 1 is a ulceration with surrounding hyperkeratotic tissue.  After debrided the wound today the wound measured 0.5 x 0.5 x 0.5 cm. There is one central area that probes down to 0.5 cm but is more superficial on the periphery of the wound.  There is no probing to bone, undermining or tunneling.  There is no surrounding erythema, ascending cellulitis.  No fluctuation or crepitation malodor.  No edema. No open lesions or pre-ulcerative lesions.  No pain with calf compression, swelling, warmth, erythema  Assessment: Right foot ulceration; without signs of infection  Plan: -All treatment options discussed with the patient including all alternatives, risks, complications.  -Wound was sharply debrided with a #312 with scalpel down to healthy, bleeding, granular tissue.  Small meta Betadine was applied followed by an offloading pad.  I did try to put a total contact cast on today but was not fitting appropriately.  We will order new one for him and we will likely do this next week. -In the meantime continue with the surgical shoe with offloading at all times and do not wear a regular shoe. -Monitor for any clinical signs or symptoms of infection and directed to call the office immediately should any occur or go to the ER.  Trula Slade DPM

## 2018-07-19 ENCOUNTER — Ambulatory Visit: Payer: Self-pay | Admitting: "Endocrinology

## 2018-07-19 ENCOUNTER — Ambulatory Visit: Payer: BLUE CROSS/BLUE SHIELD | Admitting: Nutrition

## 2018-07-19 ENCOUNTER — Telehealth: Payer: Self-pay | Admitting: Nutrition

## 2018-07-19 NOTE — Telephone Encounter (Signed)
TC to pt to reschedule his appt. Advised to get his lab work done first and then call Dr. Dorris Fetch office to reachedule missed appt and I can try to see him that same day. He verbalized understanding.

## 2018-07-20 ENCOUNTER — Encounter: Payer: Self-pay | Admitting: Podiatry

## 2018-07-20 ENCOUNTER — Ambulatory Visit (INDEPENDENT_AMBULATORY_CARE_PROVIDER_SITE_OTHER): Payer: BLUE CROSS/BLUE SHIELD | Admitting: Podiatry

## 2018-07-20 VITALS — Temp 99.9°F

## 2018-07-20 DIAGNOSIS — E0843 Diabetes mellitus due to underlying condition with diabetic autonomic (poly)neuropathy: Secondary | ICD-10-CM | POA: Diagnosis not present

## 2018-07-20 DIAGNOSIS — L97512 Non-pressure chronic ulcer of other part of right foot with fat layer exposed: Secondary | ICD-10-CM

## 2018-07-20 MED ORDER — AMOXICILLIN-POT CLAVULANATE 875-125 MG PO TABS: 1.0000 | ORAL_TABLET | Freq: Two times a day (BID) | ORAL | 0 refills | Status: DC

## 2018-07-21 NOTE — Progress Notes (Signed)
Subjective: 58 year old male presents the office today for follow-up evaluation of the wound on the bottom of his right foot.  Overall he states he is doing well.  He has not had any drainage coming from the wound.  Denies any swelling or redness.  He presents today for possible total contact cast.  Denies any fevers, chills, nausea, vomiting.  Denies any calf pain, chest pain, shortness of breath.  Has no other concerns today.    Objective: AAO x3, NAD-presents today with his wife DP/PT pulses palpable bilaterally, CRT less than 3 seconds On the right foot submetatarsal 1 is a ulceration with surrounding hyperkeratotic tissue.  After debrided the wound today the wound measured 0.8 x 0.7 x 0.5 cm.   on the central portion.  There is no surrounding erythema, ascending cellulitis.  There is no fluctuation crepitation any malodor.  There is no clinical signs of infection.   No other open lesions or pre-ulcerative lesions.  No pain with calf compression, swelling, warmth, erythema  Assessment: Right foot ulceration; without signs of infection  Plan: -All treatment options discussed with the patient including all alternatives, risks, complications.  -Wound was sharply debrided with a #312 with scalpel down to healthy, bleeding, granular tissue.  Applied small meta Betadine to the wound.  After this I did apply a total contact cast following manufacturer's guidelines.  We discussed the risks of the cast as well as to call immediately if he notices any issues with the cast or go to the emergency room.  He verbally understood this.  We will plan on changing the cast on Friday to make sure he is tolerating it well without any skin breakdown.  Discussed that he cannot drive he is to hold off on work while the cast is on.  Trula Slade DPM

## 2018-07-23 ENCOUNTER — Ambulatory Visit (INDEPENDENT_AMBULATORY_CARE_PROVIDER_SITE_OTHER): Payer: BLUE CROSS/BLUE SHIELD | Admitting: Podiatry

## 2018-07-23 DIAGNOSIS — L97512 Non-pressure chronic ulcer of other part of right foot with fat layer exposed: Secondary | ICD-10-CM

## 2018-07-23 DIAGNOSIS — E0843 Diabetes mellitus due to underlying condition with diabetic autonomic (poly)neuropathy: Secondary | ICD-10-CM

## 2018-07-24 NOTE — Progress Notes (Signed)
Subjective: 58 year old male presents the office today for follow-up evaluation of a wound on the right foot submetatarsal 1 and for follow-up evaluation after having a total contact cast applied on Tuesday.  He states that he feels the cast was doing well and other than being bulky it is fitting appropriately.  Does not feel it is rubbing.  He has not taken antibiotics. Denies any systemic complaints such as fevers, chills, nausea, vomiting. No acute changes since last appointment, and no other complaints at this time.   Objective: AAO x3, NAD DP/PT pulses palpable bilaterally, CRT less than 3 seconds Total contact cast was intact and we remove this today.  There is no area of skin irritation or breakdown other than the previous wound.  Upon evaluation of wound right foot submetatarsal 1 appears to be much improved compared to what it was from Tuesday and appears to be healing.  I debrided the wound today and there is no signs of an abscess and there is no drainage or pus.  There is no surrounding erythema, ascending cellulitis.  No fluctuation or crepitation or any malodor. No open lesions or pre-ulcerative lesions.  No pain with calf compression, swelling, warmth, erythema      Assessment: 58 year old male with healing right foot submetatarsal 1; total contact cast change  Plan: -All treatment options discussed with the patient including all alternatives, risks, complications.  -I sharply debrided the wound today utilizing a 312 with scalpel as well as a tissue nipper down to healthy, granular, bleeding tissue.  Appears to be healing and much smaller. -A new total contact cast was applied making sure to follow manufactures guidelines to pad all bony prominences.  Precautions were discussed to go to the ER or call the office immediately if there is any irritation or any problems with the cast.  Otherwise I will see him back on Monday we will change the cast. -Patient encouraged to call the  office with any questions, concerns, change in symptoms.   Trula Slade DPM

## 2018-07-26 ENCOUNTER — Encounter: Payer: Self-pay | Admitting: Podiatry

## 2018-07-26 ENCOUNTER — Ambulatory Visit (INDEPENDENT_AMBULATORY_CARE_PROVIDER_SITE_OTHER): Payer: BLUE CROSS/BLUE SHIELD | Admitting: Podiatry

## 2018-07-26 VITALS — BP 140/82 | HR 68 | Temp 99.5°F | Resp 14

## 2018-07-26 DIAGNOSIS — L97512 Non-pressure chronic ulcer of other part of right foot with fat layer exposed: Secondary | ICD-10-CM

## 2018-07-26 DIAGNOSIS — E0843 Diabetes mellitus due to underlying condition with diabetic autonomic (poly)neuropathy: Secondary | ICD-10-CM

## 2018-07-26 LAB — BASIC METABOLIC PANEL
BUN: 25 — AB (ref 4–21)
Creatinine: 1.3 (ref 0.6–1.3)

## 2018-07-27 ENCOUNTER — Ambulatory Visit (INDEPENDENT_AMBULATORY_CARE_PROVIDER_SITE_OTHER): Payer: BLUE CROSS/BLUE SHIELD | Admitting: Cardiovascular Disease

## 2018-07-27 ENCOUNTER — Encounter: Payer: Self-pay | Admitting: Cardiovascular Disease

## 2018-07-27 DIAGNOSIS — E782 Mixed hyperlipidemia: Secondary | ICD-10-CM | POA: Diagnosis not present

## 2018-07-27 DIAGNOSIS — Z959 Presence of cardiac and vascular implant and graft, unspecified: Secondary | ICD-10-CM

## 2018-07-27 DIAGNOSIS — I70229 Atherosclerosis of native arteries of extremities with rest pain, unspecified extremity: Secondary | ICD-10-CM

## 2018-07-27 DIAGNOSIS — I10 Essential (primary) hypertension: Secondary | ICD-10-CM

## 2018-07-27 DIAGNOSIS — Z9889 Other specified postprocedural states: Secondary | ICD-10-CM | POA: Insufficient documentation

## 2018-07-27 DIAGNOSIS — I998 Other disorder of circulatory system: Secondary | ICD-10-CM | POA: Diagnosis not present

## 2018-07-27 LAB — HEMOGLOBIN A1C: Hemoglobin A1C: 12.4

## 2018-07-27 NOTE — Assessment & Plan Note (Signed)
History of essential hypertension her blood pressure measured today 152/88.  He is on amlodipine, clonidine hydralazine as well as doxazosin.

## 2018-07-27 NOTE — Assessment & Plan Note (Signed)
History of hyperlipidemia on statin therapy with lipid profile performed 08/23/2015 revealing total cholesterol 127, HDL 39

## 2018-07-27 NOTE — Patient Instructions (Signed)
Medication Instructions:  Your physician recommends that you continue on your current medications as directed. Please refer to the Current Medication list given to you today.  If you need a refill on your cardiac medications before your next appointment, please call your pharmacy.   Lab work: NONE If you have labs (blood work) drawn today and your tests are completely normal, you will receive your results only by: [name] MyChart Message (if you have MyChart) OR . A paper copy in the mail If you have any lab test that is abnormal or we need to change your treatment, we will call you to review the results.  Testing/Procedures: NONE  Follow-Up: At [facility], you and your health needs are our priority.  As part of our continuing mission to provide you with exceptional heart care, we have created designated Provider Care Teams.  These Care Teams include your primary Cardiologist (physician) and Advanced Practice Providers (APPs -  Physician Assistants and Nurse Practitioners) who all work together to provide you with the care you need, when you need it. . You will need a follow up appointment in 3 months. You may see Dr. Gwenlyn Found or one of the following Advanced Practice Providers on your designated Care Team:   . Kerin Ransom, [place] . Almyra Deforest, PA-C . Fabian Sharp, PA-C . Jory Sims, DNP . Rosaria Ferries, PA-C . Roby Lofts, PA-C . Sande Rives, PA-C  Any Other Special Instructions Will Be Listed Below (If Applicable). PLEASE HAVE YOUR RECENT DOPPLER STUDIES FAXED TO OUR OFFICE. FAX #: [phone]

## 2018-07-27 NOTE — Assessment & Plan Note (Signed)
Mr. Weinreb was referred to me by Dr. Jacqualyn Posey, his podiatrist, for evaluation of a wound on the plantar surface of his right first metatarsal.  This occurred because of trauma several months ago.  He has seen Dr. Jacqualyn Posey back frequently and apparently his wound is slowly healing.  He had Dopplers done in [place] recently that apparently showed good circulation although I have not personally reviewed these.  Given this and the fact that he is recently had a renal transplant several years ago I prefer conservative to aggressive local wound care reserving any intervention for lack of healing.  I will see him back in 3 months for follow-up.

## 2018-07-27 NOTE — Progress Notes (Signed)
07/27/2018 Raymond [name]   1960-06-15  [id number]  Primary Physician Patient, No Pcp Per Primary Cardiologist: Raymond Harp MD Raymond [name], [place]  HPI:  Raymond [name] is a 58 y.o. moderately overweight separated African-American male father of 2 children, grandfather of 2 grandchildren who works as an Network engineer.  He is accompanied by his girlfriend Raymond [name] and was referred by Dr. Jacqualyn [name], his podiatrist for peripheral vascular valuation because of a small ulcer on the plantar surface of his right first metatarsal.  He has a history of treated hypertension, diabetes and hyperlipidemia.  His father had an MI at age 29 and his brother recently had a myocardial infarction as well.  He is never had a heart attack or stroke.  He denies chest pain or shortness of breath.  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  He apparently stepped on a nail approximately 2 to 3 months ago and developed a wound on the plantar surface of his right first metatarsal which Dr. Jacqualyn [name] has been treating fairly frequently over the last several months.  Apparently this is slowly healing.  He had Dopplers performed in [place] recently that were told showed normal circulation.   Current Meds  Medication Sig  . acetaminophen (TYLENOL) 500 MG tablet Take 500 mg by mouth every 6 (six) hours as needed.  [name] amLODipine (NORVASC) 5 MG tablet Take by mouth.  [name] amoxicillin-clavulanate (AUGMENTIN) 875-125 MG tablet Take 1 tablet by mouth 2 (two) times daily.  [name] atorvastatin (LIPITOR) 40 MG tablet Take by mouth.  . azaTHIOprine (IMURAN) 50 MG tablet   . bisacodyl (BISACODYL) 5 MG EC tablet Take 5 mg by mouth daily as needed for moderate constipation.  . Blood Glucose Monitoring Suppl (ACCU-CHEK GUIDE ME) w/Device KIT 1 Piece by Does not apply route as directed.  . Brinzolamide-Brimonidine  (SIMBRINZA) 1-0.2 % SUSP Apply to eye.  . cloNIDine (CATAPRES - DOSED IN MG/24 HR) 0.3 mg/24hr patch Place 0.3 mg onto the skin once a week.  . doxazosin (CARDURA) 4 MG tablet Take by mouth.  [name] glipiZIDE (GLUCOTROL XL) 5 MG 24 hr tablet Take by mouth.  [name] glucose blood (ACCU-CHEK GUIDE) test strip Use as instructed 4 x daily. E11.65  . hydrALAZINE (APRESOLINE) 25 MG tablet Take 25 mg by mouth 2 (two) times daily.  . hydrOXYzine (ATARAX/VISTARIL) 25 MG tablet Take 25 mg by mouth 2 (two) times daily as needed.  . Insulin Glargine (LANTUS SOLOSTAR) 100 UNIT/ML Solostar Pen Inject 40 Units into the skin at bedtime.  . Insulin Pen Needle (B-D ULTRAFINE III SHORT PEN) 31G X 8 MM MISC 1 each by Does not apply route as directed.  . Multiple Vitamin (MULTIVITAMIN) capsule Take 1 capsule by mouth daily.  . mupirocin ointment (BACTROBAN) 2 % Apply 1 application topically 2 (two) times daily.  . mycophenolate (MYFORTIC) 360 MG TBEC EC tablet Take by mouth.  [name] omeprazole (PRILOSEC) 20 MG capsule Take by mouth.  . simethicone (MYLICON) 80 MG chewable tablet Chew 80 mg by mouth every 6 (six) hours as needed for flatulence.  . sodium bicarbonate 650 MG tablet Take 1,300 mg by mouth 2 (two) times daily.  . tacrolimus (PROGRAF) 1 MG capsule Take 2 mg by mouth 2 (two) times daily. 2 capsules every morning, 2 capsules in the evening  . traMADol (ULTRAM) 50 MG tablet Take 1  tablet (50 mg total) by mouth every 6 (six) hours as needed.  . Vitamin D, Ergocalciferol, (DRISDOL) 50000 units CAPS capsule Take 50,000 Units by mouth every 30 (thirty) days.     No Known Allergies  Social History   Socioeconomic History  . Marital status: Married    Spouse name: Not on file  . Number of children: Not on file  . Years of education: Not on file  . Highest education level: Not on file  Occupational History  . Not on file  Social Needs  . Financial resource strain: Not on file  . Food insecurity:    Worry: Not on file     Inability: Not on file  . Transportation needs:    Medical: Not on file    Non-medical: Not on file  Tobacco Use  . Smoking status: Never Smoker  . Smokeless tobacco: Never Used  Substance and Sexual Activity  . Alcohol use: No    Alcohol/week: 0.0 standard drinks  . Drug use: No  . Sexual activity: Not on file  Lifestyle  . Physical activity:    Days per week: Not on file    Minutes per session: Not on file  . Stress: Not on file  Relationships  . Social connections:    Talks on phone: Not on file    Gets together: Not on file    Attends religious service: Not on file    Active member of club or organization: Not on file    Attends meetings of clubs or organizations: Not on file    Relationship status: Not on file  . Intimate partner violence:    Fear of current or ex partner: Not on file    Emotionally abused: Not on file    Physically abused: Not on file    Forced sexual activity: Not on file  Other Topics Concern  . Not on file  Social History Narrative   ** Merged History Encounter **         Review of Systems: General: negative for chills, fever, night sweats or weight changes.  Cardiovascular: negative for chest pain, dyspnea on exertion, edema, orthopnea, palpitations, paroxysmal nocturnal dyspnea or shortness of breath Dermatological: negative for rash Respiratory: negative for cough or wheezing Urologic: negative for hematuria Abdominal: negative for nausea, vomiting, diarrhea, bright red blood per rectum, melena, or hematemesis Neurologic: negative for visual changes, syncope, or dizziness All other systems reviewed and are otherwise negative except as noted above.    Blood pressure (!) 152/88, pulse 65, height '5\' 10"'  (1.778 m), weight 229 lb 6.4 oz (104.1 kg).  General appearance: alert and no distress Neck: no adenopathy, no carotid bruit, no JVD, supple, symmetrical, trachea midline and thyroid not enlarged, symmetric, no  tenderness/mass/nodules Lungs: clear to auscultation bilaterally Heart: regular rate and rhythm, S1, S2 normal, no murmur, click, rub or gallop Extremities: extremities normal, atraumatic, no cyanosis or edema Pulses: He had a cast on his right foot precluding pulse assessment Skin: Small penetrating ulcer plantar surface right first metatarsal Neurologic: Alert and oriented X 3, normal strength and tone. Normal symmetric reflexes. Normal coordination and gait  EKG sinus rhythm at 62 with early repolarization changes inferolateral T wave inversion.  I personally reviewed this EKG.  ASSESSMENT AND PLAN:   Essential hypertension, benign History of essential hypertension her blood pressure measured today 152/88.  He is on amlodipine, clonidine hydralazine as well as doxazosin.  Mixed hyperlipidemia History of hyperlipidemia on statin therapy with lipid  profile performed 08/23/2015 revealing total cholesterol 127, HDL 39  Critical limb ischemia with history of revascularization of same extremity Mr. Fellers was referred to me by Dr. Jacqualyn [name], his podiatrist, for evaluation of a wound on the plantar surface of his right first metatarsal.  This occurred because of trauma several months ago.  He has seen Dr. Jacqualyn [name] back frequently and apparently his wound is slowly healing.  He had Dopplers done in [place] recently that apparently showed good circulation although I have not personally reviewed these.  Given this and the fact that he is recently had a renal transplant several years ago I prefer conservative to aggressive local wound care reserving any intervention for lack of healing.  I will see him back in 3 months for follow-up.      Raymond Harp MD FACP,FACC,FAHA, [facility] 07/27/2018 12:22 PM

## 2018-07-27 NOTE — Progress Notes (Signed)
Subjective: 58 year old male presents the office today for follow-up evaluation of a wound on the right foot submetatarsal 1 and for follow-up evaluation after having a total contact cast applied on Friday.  He presents early for his appointment today because he feels that something moved inside of his calcium started to rub so he came in early.  Overall he is been doing well and is still on the antibiotics.  He denies any fevers, chills, nausea, vomiting.  Denies any calf pain, chest pain, shortness of breath.  Is no other concerns today.   Objective: AAO x3, NAD DP/PT pulses palpable bilaterally, CRT less than 3 seconds Total contact cast was intact and we remove this today.  Upon removal there is no irritation of the skin or skin breakdown.  The wound on the foot submetatarsal 1 appears to be improved.  It measures approximate 0.5 x 0.4 x 0.3 cm after debridement.  There is no probing to bone, undermining or tunneling.  There is no surrounding erythema, ascending cellulitis.  There is no fluctuation or crepitation malodor. No open lesions or pre-ulcerative lesions.  No pain with calf compression, swelling, warmth, erythema        Assessment: 58 year old male with healing right foot submetatarsal 1  Plan: -All treatment options discussed with the patient including all alternatives, risks, complications.  -Cast was removed today without complications.  There is no skin breakdown however we will give him a break from the cast.  I debrided the wound utilizing a 312 with scalpel down to healthy, bleeding, granular tissue.  Switch to iodosorb dressing changes daily. -Is a cam boot with further offloading pads to help take pressure off the area. -I will see him back on Friday or Monday at that time if there is no significant provement from the wound and we will apply the total contact cast.  Trula Slade DPM

## 2018-07-30 ENCOUNTER — Encounter: Payer: Self-pay | Admitting: Podiatry

## 2018-07-30 ENCOUNTER — Ambulatory Visit (INDEPENDENT_AMBULATORY_CARE_PROVIDER_SITE_OTHER): Payer: BLUE CROSS/BLUE SHIELD | Admitting: Podiatry

## 2018-07-30 DIAGNOSIS — L97512 Non-pressure chronic ulcer of other part of right foot with fat layer exposed: Secondary | ICD-10-CM

## 2018-07-30 DIAGNOSIS — E0843 Diabetes mellitus due to underlying condition with diabetic autonomic (poly)neuropathy: Secondary | ICD-10-CM

## 2018-07-30 NOTE — Progress Notes (Signed)
Patient is currently in for his appointment with Dr. Jacqualyn Posey right now and he will inform him of his results.

## 2018-08-02 NOTE — Progress Notes (Signed)
Subjective: 58 year old male presents the office today for follow-up evaluation of a wound on the right foot submetatarsal 1.  He has been changing the bandage daily.  Is been using a cam boot and tries to elevate his foot up he states he has to go back to work on Tuesday.  Because of this he cannot go back into contact cast.  He states he cannot take any more time off of work.  Denies any calf pain, chest pain, shortness of breath.  Is no other concerns today.   Objective: AAO x3, NAD DP/PT pulses palpable bilaterally, CRT less than 3 seconds Cam boot intact.  Upon removal and evaluation the wound there is hyperkeratotic periwound.  The wound measures approximately the same size 0.5 x 0.5 x 0.2 cm.  There is no probing, undermining or tunneling.  There is no surrounding erythema, ascending cellulitis.  No fluctuation crepitation malodor.   (This pictures after debridement.  Last appointment it was before debridement)  No other open lesions or pre-ulcerative lesions.  No pain with calf compression, swelling, warmth, erythema  Assessment: 58 year old male with healing right foot submetatarsal 1  Plan: -All treatment options discussed with the patient including all alternatives, risks, complications.  -I debrided the wound utilizing a 312 with scalpel down to healthy, bleeding, granular tissue.  We are going to go back to using Prisma every other day.  I discussed a new total contact cast at least until he gets to go back to work on Tuesday but he declined this. -Monitor for any clinical signs or symptoms of infection and directed to call the office immediately should any occur or go to the ER.  Return in about 1 week (around 08/06/2018).  Trula Slade DPM

## 2018-08-03 ENCOUNTER — Telehealth: Payer: Self-pay | Admitting: Podiatry

## 2018-08-03 NOTE — Telephone Encounter (Signed)
I need the nurse to call me back please.

## 2018-08-03 NOTE — Telephone Encounter (Signed)
I'm trying to reach someone in Dr. Leigh Aurora office to get a copy of a release form that he gave me to go back to work. I just need to have one faxed to my job. Please call me back and tell me what I need to do.

## 2018-08-03 NOTE — Telephone Encounter (Signed)
Pt called back and spoke to [place]. Estill Bamberg got the fax number and is faxing letter dated 06 March to fax number pt gave her.

## 2018-08-06 ENCOUNTER — Other Ambulatory Visit: Payer: Self-pay

## 2018-08-06 ENCOUNTER — Encounter: Payer: Self-pay | Admitting: Podiatry

## 2018-08-06 ENCOUNTER — Ambulatory Visit (INDEPENDENT_AMBULATORY_CARE_PROVIDER_SITE_OTHER): Payer: BLUE CROSS/BLUE SHIELD | Admitting: Podiatry

## 2018-08-06 DIAGNOSIS — E0843 Diabetes mellitus due to underlying condition with diabetic autonomic (poly)neuropathy: Secondary | ICD-10-CM

## 2018-08-06 DIAGNOSIS — L97512 Non-pressure chronic ulcer of other part of right foot with fat layer exposed: Secondary | ICD-10-CM | POA: Diagnosis not present

## 2018-08-15 NOTE — Progress Notes (Signed)
Subjective: 58 year old male presents the office today for follow-up evaluation of a wound on the right foot submetatarsal 1.  He states that overall the wound is getting better.  He still wearing the cam boot.  He states that he needs to go back to work.  Denies any drainage or pus coming from the wound or any swelling or redness.  He denies any fevers, chills, nausea, vomiting.  No calf pain, chest pain, shortness of breath.  Objective: AAO x3, NAD DP/PT pulses palpable bilaterally, CRT less than 3 seconds Cam boot intact.  Upon removal and evaluation the wound there is hyperkeratotic periwound.  The wound measures approximately the same size 0.3 x 0.2 x 0.2 cm. Pre-ulcer debridement was 0.2 x 0.2 cm There is no probing, undermining or tunneling.  There is no surrounding erythema, ascending cellulitis.  No fluctuation crepitation malodor. No other open lesions or pre-ulcerative lesions.  No pain with calf compression, swelling, warmth, erythema  Assessment: 58 year old male with healing right foot submetatarsal 1  Plan: -All treatment options discussed with the patient including all alternatives, risks, complications.  -I debrided the wound utilizing a 312 with scalpel down to healthy, bleeding, granular tissue.  We are going to go back to using Prisma every other day. Declines another TCC.  Recommended continue with offloading at all times Medicare..  I would hold off know back to work at least until next appointment to further evaluate the wound to ensure healing.  He states that he has to go back to work.  Needs to wear the cam boot at work if he goes back earlier. -Monitor for any clinical signs or symptoms of infection and directed to call the office immediately should any occur or go to the ER.  Return in about 1 week  Trula Slade DPM

## 2018-08-16 ENCOUNTER — Other Ambulatory Visit: Payer: Self-pay

## 2018-08-16 ENCOUNTER — Ambulatory Visit: Payer: BLUE CROSS/BLUE SHIELD | Admitting: Podiatry

## 2018-08-16 ENCOUNTER — Ambulatory Visit (INDEPENDENT_AMBULATORY_CARE_PROVIDER_SITE_OTHER): Payer: BLUE CROSS/BLUE SHIELD | Admitting: Podiatry

## 2018-08-16 ENCOUNTER — Encounter: Payer: Self-pay | Admitting: Podiatry

## 2018-08-16 VITALS — Temp 96.9°F

## 2018-08-16 DIAGNOSIS — E0843 Diabetes mellitus due to underlying condition with diabetic autonomic (poly)neuropathy: Secondary | ICD-10-CM

## 2018-08-16 DIAGNOSIS — M79676 Pain in unspecified toe(s): Secondary | ICD-10-CM

## 2018-08-16 DIAGNOSIS — L97512 Non-pressure chronic ulcer of other part of right foot with fat layer exposed: Secondary | ICD-10-CM | POA: Diagnosis not present

## 2018-08-16 NOTE — Progress Notes (Signed)
Subjective: 58 year old male presents the office today for follow-up evaluation of a wound on the right foot submetatarsal 1.  He is still wearing the cam boot.  He states he is ready go back to work he can wear the cam boot if he goes back to work.  He states the wound is been doing well and is been continue with every other day dressing changes with prisma. He dies any redness, swelling, drainage/pus.  He denies any fevers, chills, nausea, vomiting.  No calf pain, chest pain, shortness of breath.  Objective: AAO x3, NAD DP/PT pulses palpable bilaterally, CRT less than 3 seconds Cam boot intact.  Upon removal and evaluation the wound there is hyperkeratotic periwound.  The wound measures approximately the same size 0.2 x 0.2 x 0.1 cm. Pre-ulcer debridement 0.1 x 0.1 with hyperkeratotic periwound. There is no probing, undermining or tunneling.  There is no surrounding erythema, ascending cellulitis.  No fluctuation crepitation malodor. No other open lesions or pre-ulcerative lesions.  No pain with calf compression, swelling, warmth, erythema  Assessment: 58 year old male with healing right foot submetatarsal 1  Plan: -All treatment options discussed with the patient including all alternatives, risks, complications.  -I debrided the wound utilizing a 312 with scalpel down to healthy, bleeding, granular tissue.  We are going to continue using Prisma every other day. Continue with CAM boot at all time. I will release him to go back to work with the premise that he is to wear the CAM boot and keep the wound offloaded.  Discussed with him that there is any worsening wound he is to call me and come back. -Monitor for any clinical signs or symptoms of infection and directed to call the office immediately should any occur or go to the ER.  RTC 2 weeks or sooner if needed  Trula Slade DPM

## 2018-08-30 ENCOUNTER — Other Ambulatory Visit: Payer: Self-pay

## 2018-08-30 ENCOUNTER — Ambulatory Visit (INDEPENDENT_AMBULATORY_CARE_PROVIDER_SITE_OTHER): Payer: BLUE CROSS/BLUE SHIELD | Admitting: Podiatry

## 2018-08-30 ENCOUNTER — Encounter: Payer: Self-pay | Admitting: Podiatry

## 2018-08-30 VITALS — Temp 97.9°F

## 2018-08-30 DIAGNOSIS — E0843 Diabetes mellitus due to underlying condition with diabetic autonomic (poly)neuropathy: Secondary | ICD-10-CM | POA: Diagnosis not present

## 2018-08-30 DIAGNOSIS — L02611 Cutaneous abscess of right foot: Secondary | ICD-10-CM | POA: Diagnosis not present

## 2018-08-30 DIAGNOSIS — L97519 Non-pressure chronic ulcer of other part of right foot with unspecified severity: Secondary | ICD-10-CM

## 2018-08-30 MED ORDER — DOXYCYCLINE HYCLATE 100 MG PO TABS: 100.0000 mg | ORAL_TABLET | Freq: Two times a day (BID) | ORAL | 0 refills | Status: DC

## 2018-08-30 NOTE — Progress Notes (Addendum)
Subjective: 58 year old male presents the office today for follow-up evaluation of a wound on the right foot submetatarsal 1.  He thinks there is some skin that be trimmed on the edges.  He states he has had some minimal swelling he states there was 1 day he noticed some pus but no other pus since then.  Denies any redness or red streaks. He has ben wearing his offloading boot. He denies any fevers, chills, nausea, vomiting.  No calf pain, chest pain, shortness of breath.  Objective: AAO x3, NAD DP/PT pulses palpable bilaterally, CRT less than 3 seconds Cam boot intact.  Upon removal and evaluation the wound there is hyperkeratotic periwound. Upon debridement it appears almost as if there is an old blister around the area. There is mild superficial purulence identified today. I debrided all the nonviable hyperkeratotic tissue today without any complications. There is no probing, undermining or tunneling.  Minimal surrounding edema without any surrounding erythema, ascending cellulitis.  Is no fluctuation crepitation any malodor.  Underlying wound appears to be healed but there is an area of macerated pre-ulcerative tissue which measures 0.8 x 0.5 cm. No other open lesions or pre-ulcerative lesions.  No pain with calf compression, swelling, warmth, erythema  Assessment: 58 year old male with right foot submetatarsal 1; superficial abscess  Plan: -All treatment options discussed with the patient including all alternatives, risks, complications.  -I debrided all nonviable tissue to identify area of superficial purulence.  Area was debrided of all nonviable tissue after debridement there is no further purulence.  No signs of acute infection today.  There is no probing.  Would not start antibiotics.  Prescribe doxycycline for him.  Offloading at all times.  Debridement want to try to the area given the macerated tissue.  Monitoring signs or symptoms of worsening infection reviewed ER should any  occur. -Follow-up in 1 week or sooner if needed  Trula Slade DPM

## 2018-09-02 ENCOUNTER — Emergency Department (HOSPITAL_COMMUNITY)
Admission: EM | Admit: 2018-09-02 | Discharge: 2018-09-03 | Disposition: A | Discharge status: Home / Self Care | Payer: BLUE CROSS/BLUE SHIELD | Attending: Emergency Medicine | Admitting: Emergency Medicine

## 2018-09-02 DIAGNOSIS — Z940 Kidney transplant status: Secondary | ICD-10-CM | POA: Diagnosis not present

## 2018-09-02 DIAGNOSIS — I120 Hypertensive chronic kidney disease with stage 5 chronic kidney disease or end stage renal disease: Secondary | ICD-10-CM | POA: Diagnosis not present

## 2018-09-02 DIAGNOSIS — M542 Cervicalgia: Secondary | ICD-10-CM | POA: Insufficient documentation

## 2018-09-02 DIAGNOSIS — E1122 Type 2 diabetes mellitus with diabetic chronic kidney disease: Secondary | ICD-10-CM | POA: Insufficient documentation

## 2018-09-02 DIAGNOSIS — N186 End stage renal disease: Secondary | ICD-10-CM | POA: Insufficient documentation

## 2018-09-03 ENCOUNTER — Other Ambulatory Visit: Payer: Self-pay

## 2018-09-03 ENCOUNTER — Encounter (HOSPITAL_COMMUNITY): Payer: Self-pay

## 2018-09-03 MED ORDER — DIAZEPAM 5 MG PO TABS: 2.5000 mg | ORAL_TABLET | Freq: Four times a day (QID) | ORAL | 0 refills | Status: DC | PRN

## 2018-09-03 MED ORDER — FENTANYL CITRATE (PF) 100 MCG/2ML IJ SOLN
100.0000 ug | Freq: Once | INTRAMUSCULAR | Status: AC
  Administered 2018-09-03: 01:00:00 100 ug via INTRAMUSCULAR
  Filled 2018-09-03: qty 2

## 2018-09-03 MED ORDER — DIAZEPAM 2 MG PO TABS
2.0000 mg | ORAL_TABLET | Freq: Once | ORAL | Status: AC
  Administered 2018-09-03: 01:00:00 2 mg via ORAL
  Filled 2018-09-03: qty 1

## 2018-09-03 NOTE — ED Triage Notes (Signed)
Pt reports 2 day history of pain in his neck on both sides, denies strain or injury

## 2018-09-03 NOTE — ED Provider Notes (Signed)
Emergency Department Provider Note   I have reviewed the triage vital signs and the nursing notes.   HISTORY  Chief Complaint Neck Pain   HPI Raymond [name] is a 58 y.o. male with history of chronic kidney disease status post transplant, diabetes, hypertension who presents to the emergency department today with neck pain.  Patient states he has right sided neck pain worse with movement and certain positions.  No midline pain.  No fevers, nausea, vomiting, trauma, weakness or sensation changes in his arms or history of the same.  He woke up this way earlier today.  When he describes the neck pain he points to the right of the occiput with certain movements of his head.  Does not know that he has any rash, warmth or other abnormalities there.   No other associated or modifying symptoms.    Past Medical History:  Diagnosis Date  . Chronic kidney disease    Awaiting transplant  . Diabetes ([place])   . Diabetes mellitus without complication ([place])   . Hypertension   . Renal disorder     Patient Active Problem List   Diagnosis Date Noted  . Critical limb ischemia with history of revascularization of same extremity 07/27/2018  . Mixed hyperlipidemia 02/19/2018  . Class 1 obesity due to excess calories with serious comorbidity and body mass index (BMI) of 31.0 to 31.9 in adult 02/19/2018  . History of renal transplant 02/19/2018  . Bilateral impacted cerumen 08/31/2017  . Conductive hearing loss, bilateral 08/31/2017  . Nuclear sclerotic cataract of right eye 10/07/2016  . Pseudophakia of left eye 10/07/2016  . Hypertension due to endocrine disorder 03/04/2016  . 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 Renal failure 03/04/2016  . Acute kidney injury ([place]) 12/10/2015  . Immunosuppression ([place]) 12/10/2015  . Nausea vomiting and diarrhea 12/10/2015  . Uncontrolled type 2 diabetes mellitus with  ESRD (end-stage renal disease) ([place]) 12/10/2015  . Essential hypertension, benign 12/10/2015  . ARF (acute renal failure) ([place]) 12/10/2015  . Pancytopenia ([place]) 12/10/2015  . Encounter for screening colonoscopy 11/15/2014  . GERD (gastroesophageal reflux disease) 11/15/2014    Past Surgical History:  Procedure Laterality Date  . COLONOSCOPY N/A 12/05/2014   KDT:[id number] external and internal hemorrhoid/mild diverticulosis/11 polyps removed  . ESOPHAGOGASTRODUODENOSCOPY N/A 12/05/2014   KDX:IPJA duodenitis  . KIDNEY TRANSPLANT  03/2015    Current Outpatient Rx  . Order #: [id number] Class: Historical Med  . Order #: [id number] Class: Historical Med  . Order #: [id number] Class: Historical Med  . Order #: [id number] Class: Historical Med  . Order #: [id number] Class: Historical Med  . Order #: [id number] Class: Normal  . Order #: [id number] Class: Historical Med  . Order #: [id number] Class: Historical Med  . Order #: [id number] Class: Print  . Order #: [id number] Class: Historical Med  . Order #: [id number] Class: Normal  . Order #: [id number] Class: Historical Med  . Order #: [id number] Class: Normal  . Order #: [id number] Class: Historical Med  . Order #: [id number] Class: Historical Med  . Order #: [id number] Class: Normal  . Order #: [id number] Class: Normal  . Order #: [id number] Class: Historical Med  . Order #: [id number] Class: Normal  . Order #: [id number] Class: Historical Med  . Order #: [id number] Class: Historical Med  . Order #: [id number] Class: Historical Med  . Order #: [id number] Class: Historical Med  . Order #: [id number] Class: Historical Med  . Order #: [id number] Class: Print  . Order #: [id number] Class: Historical Med    Allergies Patient  has no known allergies.  Family History  Problem Relation Age of Onset  . Diabetes Mother   . Colon cancer Neg Hx     Social History Social History   Tobacco Use  . Smoking status: Never Smoker  . Smokeless tobacco: Never Used  Substance Use Topics   . Alcohol use: No    Alcohol/week: 0.0 standard drinks  . Drug use: No    Review of Systems  All other systems negative except as documented in the HPI. All pertinent positives and negatives as reviewed in the HPI. ____________________________________________   PHYSICAL EXAM:  VITAL SIGNS: ED Triage Vitals  Enc Vitals Group     BP 09/03/18 0008 (!) 162/87     Pulse Rate 09/03/18 0004 67     Resp 09/03/18 0004 17     Temp 09/03/18 0004 98.2 F (36.8 C)     Temp Source 09/03/18 0004 Oral     SpO2 09/03/18 0004 99 %     Weight 09/03/18 0002 219 lb (99.3 kg)     Height 09/03/18 0002 5' 10.5" (1.791 m)     Head Circumference --      Peak Flow --      Pain Score 09/03/18 0002 10     Pain Loc --      Pain Edu? --      Excl. in Colon? --     Constitutional: Alert and oriented. Well appearing and in no acute distress. Eyes: Conjunctivae are normal. PERRL. EOMI. Head: Atraumatic. ttp over insertion of paracervical muscles to right of occiput.  Nose: No congestion/rhinnorhea. Mouth/Throat: Mucous membranes are moist.  Oropharynx non-erythematous. Neck: No stridor.  No meningeal signs.   Cardiovascular: Normal rate, regular rhythm. Good peripheral circulation. Grossly normal heart sounds.   Respiratory: Normal respiratory effort.  No retractions. Lungs CTAB. Gastrointestinal: Soft and nontender. No distention.  Musculoskeletal: No lower extremity tenderness nor edema. No gross deformities of extremities. Neurologic:  Normal speech and language. No gross focal neurologic deficits are appreciated.  Skin:  Skin is warm, dry and intact. No rash noted.  ____________________________________________    INITIAL IMPRESSION / ASSESSMENT AND PLAN / ED COURSE  Is on immune suppressant for his kidney transplant and also diabetes but no evidence of infection at the site of pain.  I doubt is meningitis as the pains off to the side and he does not have actual cervical pain.  Seems like a muscle  versus tendinitis type pain. No indictaion for imaging at this time. With his diabetes and kidney issues will defer prednisone.  Will treat conservatively now with heat, range of motion, massage and muscle relaxers with PCP follow-up if not improving.     Pertinent labs & imaging results that were available during my care of the patient were reviewed by me and considered in my medical decision making (see chart for details).  A medical screening exam was performed and I feel the patient has had an appropriate workup for their chief complaint at this time and likelihood of emergent condition existing is low. They have been counseled on decision, discharge, follow up and which symptoms necessitate immediate return to the emergency department. They or their family verbally stated understanding and agreement with plan and discharged in stable condition.   ____________________________________________  FINAL CLINICAL IMPRESSION(S) / ED DIAGNOSES  Final diagnoses:  Neck pain     MEDICATIONS GIVEN DURING THIS VISIT:  Medications  fentaNYL (SUBLIMAZE) injection 100 mcg (has no administration in time range)  diazepam (VALIUM) tablet 2 mg (has no administration in time range)     NEW OUTPATIENT MEDICATIONS STARTED DURING THIS VISIT:  New Prescriptions   DIAZEPAM (VALIUM) 5 MG TABLET    Take 0.5 tablets (2.5 mg total) by mouth every 6 (six) hours as needed for anxiety (spasms).    Note:  This note was prepared with assistance of Dragon voice recognition software. Occasional wrong-word or sound-a-like substitutions may have occurred due to the inherent limitations of voice recognition software.   [place] Hagger, Corene Cornea, MD 09/03/18 [phone]

## 2018-09-06 ENCOUNTER — Ambulatory Visit: Payer: BLUE CROSS/BLUE SHIELD | Admitting: Podiatry

## 2018-09-13 ENCOUNTER — Other Ambulatory Visit: Payer: Self-pay

## 2018-09-13 ENCOUNTER — Ambulatory Visit (INDEPENDENT_AMBULATORY_CARE_PROVIDER_SITE_OTHER): Payer: BLUE CROSS/BLUE SHIELD | Admitting: Podiatry

## 2018-09-13 ENCOUNTER — Ambulatory Visit: Payer: BLUE CROSS/BLUE SHIELD | Admitting: Podiatry

## 2018-09-13 DIAGNOSIS — E0843 Diabetes mellitus due to underlying condition with diabetic autonomic (poly)neuropathy: Secondary | ICD-10-CM | POA: Diagnosis not present

## 2018-09-13 DIAGNOSIS — L97519 Non-pressure chronic ulcer of other part of right foot with unspecified severity: Secondary | ICD-10-CM

## 2018-09-16 NOTE — Progress Notes (Signed)
Subjective: 58 year old male presents the office today for follow-up evaluation of a wound on the right foot submetatarsal 1. He states that the wound has almost completely healed. He denies any drainage or pus. No swelling or redness.  He is back to wearing a regular shoe.  He denies any fevers, chills, nausea, vomiting.  No calf pain, chest pain, shortness of breath.  Objective: AAO x3, NAD DP/PT pulses palpable bilaterally, CRT less than 3 seconds Hyperkeratotic tissue present on the right foot submetatarsal 1.  Upon debridement it is almost completely healed at this time.  There was 1 small superficial abrasion type lesion most of pinpoint in size.  There is no edema no erythema there is no drainage or pus there is no fluctuation malodor. The infection appears to be resolved.  No other open lesions or pre-ulcerative lesions.  No pain with calf compression, swelling, warmth, erythema  Assessment: 58 year old male with right foot submetatarsal 1 with much improvement and resolved infection.   Plan: -All treatment options discussed with the patient including all alternatives, risks, complications.  -I sharply debrided the hyperkerotic lesion without any complications or bleeding.  Small superficial area remains almost healed.  No signs of infection.  Continue daily dressing changes with antibiotic ointment.  Offloading pads were dispensed today.  Recommend regular shoe.  Monitor closely for signs and symptoms of infection. -Monitor for any clinical signs or symptoms of infection and directed to call the office immediately should any occur or go to the ER.  RTC 4 weeks or sooner if needed  Trula Slade DPM   -I debrided all nonviable tissue to identify area of superficial purulence.  Area was debrided of all nonviable tissue after debridement there is no further purulence.  No signs of acute infection today.  There is no probing.  Would not start antibiotics.  Prescribe doxycycline for  him.  Offloading at all times.  Debridement want to try to the area given the macerated tissue.  Monitoring signs or symptoms of worsening infection reviewed ER should any occur. -Follow-up in 1 week or sooner if needed  Trula Slade DPM

## 2018-09-17 ENCOUNTER — Other Ambulatory Visit: Payer: Self-pay

## 2018-09-17 ENCOUNTER — Emergency Department
Admission: EM | Admit: 2018-09-17 | Discharge: 2018-09-17 | Disposition: A | Discharge status: Other Acute Inpatient Hospital | Payer: BLUE CROSS/BLUE SHIELD | Attending: Emergency Medicine | Admitting: Emergency Medicine

## 2018-09-17 ENCOUNTER — Emergency Department: Payer: BLUE CROSS/BLUE SHIELD

## 2018-09-17 DIAGNOSIS — Z79899 Other long term (current) drug therapy: Secondary | ICD-10-CM | POA: Diagnosis not present

## 2018-09-17 DIAGNOSIS — I129 Hypertensive chronic kidney disease with stage 1 through stage 4 chronic kidney disease, or unspecified chronic kidney disease: Secondary | ICD-10-CM | POA: Diagnosis not present

## 2018-09-17 DIAGNOSIS — E119 Type 2 diabetes mellitus without complications: Secondary | ICD-10-CM | POA: Diagnosis not present

## 2018-09-17 DIAGNOSIS — N10 Acute tubulo-interstitial nephritis: Secondary | ICD-10-CM | POA: Diagnosis not present

## 2018-09-17 DIAGNOSIS — R109 Unspecified abdominal pain: Secondary | ICD-10-CM

## 2018-09-17 DIAGNOSIS — Z794 Long term (current) use of insulin: Secondary | ICD-10-CM | POA: Insufficient documentation

## 2018-09-17 DIAGNOSIS — N189 Chronic kidney disease, unspecified: Secondary | ICD-10-CM | POA: Insufficient documentation

## 2018-09-17 DIAGNOSIS — R1030 Lower abdominal pain, unspecified: Secondary | ICD-10-CM | POA: Diagnosis present

## 2018-09-17 DIAGNOSIS — N12 Tubulo-interstitial nephritis, not specified as acute or chronic: Secondary | ICD-10-CM

## 2018-09-17 LAB — URINALYSIS, COMPLETE (UACMP) WITH MICROSCOPIC
Bacteria, UA: NONE SEEN
Bilirubin Urine: NEGATIVE
Glucose, UA: 150 mg/dL — AB
Hgb urine dipstick: NEGATIVE
Ketones, ur: NEGATIVE mg/dL
Leukocytes,Ua: NEGATIVE
Nitrite: NEGATIVE
Protein, ur: 30 mg/dL — AB
Specific Gravity, Urine: 1.028 (ref 1.005–1.030)
Squamous Epithelial / HPF: NONE SEEN (ref 0–5)
pH: 5 (ref 5.0–8.0)

## 2018-09-17 LAB — CBC WITH DIFFERENTIAL/PLATELET
Abs Immature Granulocytes: 0.09 10*3/uL — ABNORMAL HIGH (ref 0.00–0.07)
Basophils Absolute: 0 10*3/uL (ref 0.0–0.1)
Basophils Relative: 0 %
Eosinophils Absolute: 0.2 10*3/uL (ref 0.0–0.5)
Eosinophils Relative: 2 %
HCT: 35.1 % — ABNORMAL LOW (ref 39.0–52.0)
Hemoglobin: 10.7 g/dL — ABNORMAL LOW (ref 13.0–17.0)
Immature Granulocytes: 1 %
Lymphocytes Relative: 13 %
Lymphs Abs: 1.3 10*3/uL (ref 0.7–4.0)
MCH: 25.8 pg — ABNORMAL LOW (ref 26.0–34.0)
MCHC: 30.5 g/dL (ref 30.0–36.0)
MCV: 84.8 fL (ref 80.0–100.0)
Monocytes Absolute: 0.9 10*3/uL (ref 0.1–1.0)
Monocytes Relative: 10 %
Neutro Abs: 7 10*3/uL (ref 1.7–7.7)
Neutrophils Relative %: 74 %
Platelets: 209 10*3/uL (ref 150–400)
RBC: 4.14 MIL/uL — ABNORMAL LOW (ref 4.22–5.81)
RDW: 13.1 % (ref 11.5–15.5)
WBC: 9.5 10*3/uL (ref 4.0–10.5)
nRBC: 0 % (ref 0.0–0.2)

## 2018-09-17 LAB — COMPREHENSIVE METABOLIC PANEL
ALT: 15 U/L (ref 0–44)
AST: 13 U/L — ABNORMAL LOW (ref 15–41)
Albumin: 2.6 g/dL — ABNORMAL LOW (ref 3.5–5.0)
Alkaline Phosphatase: 92 U/L (ref 38–126)
Anion gap: 7 (ref 5–15)
BUN: 39 mg/dL — ABNORMAL HIGH (ref 6–20)
CO2: 26 mmol/L (ref 22–32)
Calcium: 9.2 mg/dL (ref 8.9–10.3)
Chloride: 102 mmol/L (ref 98–111)
Creatinine, Ser: 1.39 mg/dL — ABNORMAL HIGH (ref 0.61–1.24)
GFR calc Af Amer: >60 mL/min (ref >60)
GFR calc non Af Amer: 56 mL/min — ABNORMAL LOW (ref >60)
Glucose, Bld: 242 mg/dL — ABNORMAL HIGH (ref 70–99)
Potassium: 4.3 mmol/L (ref 3.5–5.1)
Sodium: 135 mmol/L (ref 135–145)
Total Bilirubin: 0.5 mg/dL (ref 0.3–1.2)
Total Protein: 7.8 g/dL (ref 6.5–8.1)

## 2018-09-17 MED ORDER — IOHEXOL 300 MG/ML  SOLN
100.0000 mL | Freq: Once | INTRAMUSCULAR | Status: AC | PRN
  Administered 2018-09-17: 100 mL via INTRAVENOUS

## 2018-09-17 MED ORDER — SODIUM CHLORIDE 0.9 % IV SOLN
1.0000 g | Freq: Once | INTRAVENOUS | Status: AC
  Administered 2018-09-17: 1 g via INTRAVENOUS
  Filled 2018-09-17: qty 10

## 2018-09-17 NOTE — ED Notes (Signed)
No swelling, bruising, or wounds noted in right groin where pain is located. dorsal pedis pulse present in both lower extremities with doppler. Skin warm. 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

## 2018-09-17 NOTE — ED Triage Notes (Signed)
Pt c/o right sided groin/thigh pain for the past 3 days, states at first the pain was controlled by taking tylenol and now is not getting any relief and up all night with pain. Denies swelling or injury.

## 2018-09-17 NOTE — ED Notes (Signed)
[facility] transport arrived to transport pt at this time

## 2018-09-17 NOTE — ED Provider Notes (Signed)
[facility] Emergency Department Provider Note  ____________________________________________   I have reviewed the triage vital signs and the nursing notes.   HISTORY  Chief Complaint Groin Pain   History limited by: Not Limited   HPI Raymond [name] is a 58 y.o. male who presents to the emergency department today because of concerns for right thigh, groin and lower abdominal pain.  He states the symptoms started 3 to 4 days ago.  They have gradually been getting worse.  Initially he was able to control the pain with Tylenol and ibuprofen.  Pain is worse with attempted movement.  He denies any preceding trauma or unusual activity.  He has not noticed any change in defecation or urination.  He denies any fevers.  No numbness in that leg.  Denies similar pain in the past.   Records reviewed. Per medical record review patient has a history of DM, kidney transplant.   Past Medical History:  Diagnosis Date  . Chronic kidney disease    Awaiting transplant  . Diabetes ([place])   . Diabetes mellitus without complication ([place])   . Hypertension   . Renal disorder     Patient Active Problem List   Diagnosis Date Noted  . Critical limb ischemia with history of revascularization of same extremity 07/27/2018  . Mixed hyperlipidemia 02/19/2018  . Class 1 obesity due to excess calories with serious comorbidity and body mass index (BMI) of 31.0 to 31.9 in adult 02/19/2018  . History of renal transplant 02/19/2018  . Bilateral impacted cerumen 08/31/2017  . Conductive hearing loss, bilateral 08/31/2017  . Nuclear sclerotic cataract of right eye 10/07/2016  . Pseudophakia of left eye 10/07/2016  . Hypertension due to endocrine disorder 03/04/2016  . 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 Renal failure 03/04/2016  . Acute kidney injury ([place]) 12/10/2015  . Immunosuppression ([place])  12/10/2015  . Nausea vomiting and diarrhea 12/10/2015  . 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) ([place]) 12/10/2015  . Essential hypertension, benign 12/10/2015  . ARF (acute renal failure) ([place]) 12/10/2015  . Pancytopenia ([place]) 12/10/2015  . Encounter for screening colonoscopy 11/15/2014  . GERD (gastroesophageal reflux disease) 11/15/2014    Past Surgical History:  Procedure Laterality Date  . COLONOSCOPY N/A 12/05/2014   UQJ:[id number] external and internal hemorrhoid/mild diverticulosis/11 polyps removed  . ESOPHAGOGASTRODUODENOSCOPY N/A 12/05/2014   WLS:LHTD duodenitis  . KIDNEY TRANSPLANT  03/2015    Prior to Admission medications   Medication Sig Start Date End Date Taking? Authorizing Provider  acetaminophen (TYLENOL) 500 MG tablet Take 500 mg by mouth every 6 (six) hours as needed.    [provider]  amLODipine (NORVASC) 5 MG tablet Take by mouth.    [provider]  atorvastatin (LIPITOR) 40 MG tablet Take by mouth.    [provider]  azaTHIOprine (IMURAN) 50 MG tablet  05/20/18   [provider]  bisacodyl (BISACODYL) 5 MG EC tablet Take 5 mg by mouth daily as needed for moderate constipation.    [provider]  Blood Glucose Monitoring Suppl (ACCU-CHEK GUIDE ME) w/Device KIT 1 Piece by Does not apply route as directed. 02/18/18   [name], MD  Brinzolamide-Brimonidine [facility]) 1-0.2 % SUSP Apply to eye.    [provider]  cloNIDine (CATAPRES - DOSED IN MG/24 HR) 0.3 mg/24hr patch Place 0.3 mg onto the skin once a week.    [provider]  diazepam (VALIUM) 5 MG tablet Take 0.5 tablets (2.5 mg total) by mouth every 6 (six) hours as needed for anxiety (spasms). 09/03/18   Mesner, Corene Cornea, MD  doxazosin (CARDURA) 4 MG tablet Take by mouth. 12/18/15   [provider]  doxycycline (VIBRA-TABS) 100 MG tablet Take 1 tablet (100 mg total) by mouth 2 (two) times daily.  08/30/18   Trula Slade, DPM  glipiZIDE (GLUCOTROL XL) 5 MG 24 hr tablet Take by mouth.    [provider]  glucose blood (ACCU-CHEK GUIDE) test strip Use as instructed 4 x daily. E11.65 02/19/18   [name], MD  hydrALAZINE (APRESOLINE) 25 MG tablet Take 25 mg by mouth 2 (two) times daily.    [provider]  hydrOXYzine (ATARAX/VISTARIL) 25 MG tablet Take 25 mg by mouth 2 (two) times daily as needed.    [provider]  Insulin Glargine (LANTUS SOLOSTAR) 100 UNIT/ML Solostar Pen Inject 40 Units into the skin at bedtime. 06/21/18   [name], MD  Insulin Pen Needle (B-D ULTRAFINE III SHORT PEN) 31G X 8 MM MISC 1 each by Does not apply route as directed. 02/18/18   [name], MD  Multiple Vitamin (MULTIVITAMIN) capsule Take 1 capsule by mouth daily.    [provider]  mupirocin ointment (BACTROBAN) 2 % Apply 1 application topically 2 (two) times daily. 03/01/18   Trula Slade, DPM  mycophenolate (MYFORTIC) 360 MG TBEC EC tablet Take by mouth.    [provider]  omeprazole (PRILOSEC) 20 MG capsule Take by mouth. 11/15/14   [provider]  simethicone (MYLICON) 80 MG chewable tablet Chew 80 mg by mouth every 6 (six) hours as needed for flatulence.    [provider]  sodium bicarbonate 650 MG tablet Take 1,300 mg by mouth 2 (two) times daily.    [provider]  tacrolimus (PROGRAF) 1 MG capsule Take 2 mg by mouth 2 (two) times daily. 2 capsules every morning, 2 capsules in the evening    [provider]  traMADol (ULTRAM) 50 MG tablet Take 1 tablet (50 mg total) by mouth every 6 (six) hours as needed. 99/24/26   Delora Fuel, MD  Vitamin D, Ergocalciferol, (DRISDOL) 50000 units CAPS capsule Take 50,000 Units by mouth every 30 (thirty) days.    [provider]    Allergies Patient has no known allergies.  Family History  Problem Relation Age of Onset  . Diabetes  Mother   . Colon cancer Neg Hx     Social History Social History   Tobacco Use  . Smoking status: Never Smoker  . Smokeless tobacco: Never Used  Substance Use Topics  . Alcohol use: No    Alcohol/week: 0.0 standard drinks  . Drug use: No    Review of Systems Constitutional: No fever/chills Eyes: No visual changes. ENT: No sore throat. Cardiovascular: Denies chest pain. Respiratory: Denies shortness of breath. Gastrointestinal: Positive for right lower quadrant pain.  Genitourinary: Negative for dysuria. Musculoskeletal: Positive for right thigh pain.  Skin: Negative for rash. Neurological: Negative for headaches, focal weakness or numbness.  ____________________________________________   PHYSICAL EXAM:  VITAL SIGNS: ED Triage Vitals  Enc Vitals Group     BP 09/17/18 0817 (!) 158/42     Pulse Rate 09/17/18 0817 (!) 52     Resp 09/17/18 0817 18     Temp 09/17/18 0817 98.1 F (36.7 C)     Temp Source 09/17/18 0817  Oral     SpO2 09/17/18 0817 97 %     Weight 09/17/18 0818 216 lb (98 kg)     Height 09/17/18 0818 _0  (1.778 m)     Head Circumference --      Peak Flow --      Pain Score 09/17/18 0818 8   Constitutional: Alert and oriented.  Eyes: Conjunctivae are normal.  ENT      Head: Normocephalic and atraumatic.      Nose: No congestion/rhinnorhea.      Mouth/Throat: Mucous membranes are moist.      Neck: No stridor. Hematological/Lymphatic/Immunilogical: No cervical lymphadenopathy. Cardiovascular: Normal rate, regular rhythm.  No murmurs, rubs, or gallops.  Respiratory: Normal respiratory effort without tachypnea nor retractions. Breath sounds are clear and equal bilaterally. No wheezes/rales/rhonchi. Gastrointestinal: Soft and tender to palpation in the right lower quadrant.  Genitourinary: Deferred Musculoskeletal: Normal range of motion in all extremities. No lower extremity edema. No tenderness over right thigh. No swelling or rash to right thigh.   Neurologic:  Normal speech and language. No gross focal neurologic deficits are appreciated.  Skin:  Skin is warm, dry and intact. No rash noted. Psychiatric: Mood and affect are normal. Speech and behavior are normal. Patient exhibits appropriate insight and judgment.  ____________________________________________    LABS (pertinent positives/negatives)  CMP na 135, k 4.3, glu 242, cr 1.39 UA clear, glucose 150, protein 30 CBC wbc 9.5, hgb 10.7, plt 209  ____________________________________________   EKG  None  ____________________________________________    RADIOLOGY  CT abd/pel Concern for transplanted kidney infection and abscess around its blood supply.  I, Nance Pear, personally discussed these images and results by phone with the on-call radiologist and used this discussion as part of my medical decision making.   ____________________________________________   PROCEDURES  Procedures  CRITICAL CARE Performed by: Nance Pear   Total critical care time: 35 minutes  Critical care time was exclusive of separately billable procedures and treating other patients.  Critical care was necessary to treat or prevent imminent or life-threatening deterioration.  Critical care was time spent personally by me on the following activities: development of treatment plan with patient and/or surrogate as well as nursing, discussions with consultants, evaluation of patient's response to treatment, examination of patient, obtaining history from patient or surrogate, ordering and performing treatments and interventions, ordering and review of laboratory studies, ordering and review of radiographic studies, pulse oximetry and re-evaluation of patient's condition.  ____________________________________________   INITIAL IMPRESSION / ASSESSMENT AND PLAN / ED COURSE  Pertinent labs & imaging results that were available during my care of the patient were reviewed by me and  considered in my medical decision making (see chart for details).   Patient presented to the emergency department today because of concerns for right lower quadrant abdominal, right groin and right upper thigh pain.  On exam he was tender over that right lower quadrant.  CT scan was obtained given the tenderness which was concerning for inflammation around his transplanted kidney.  Additionally radiologist are calling a large abscess around the renal artery.  Given concern for compromise of the transplant kidney I did reach out to the patient nephrology team in [place].  I also discussed to transplant surgeon at [facility].  Will plan on transfer.  Patient was given dose of IV antibiotics here in emergency department.  I discussed findings and plan with patient.  ____________________________________________   FINAL CLINICAL IMPRESSION(S) / ED DIAGNOSES  Final diagnoses:  Abdominal  pain, unspecified abdominal location  Pyelonephritis     Note: This dictation was prepared with Dragon dictation. Any transcriptional errors that result from this process are unintentional     Nance Pear, MD 09/17/18 [phone]

## 2018-09-17 NOTE — ED Notes (Signed)
emtala reviewed by this RN 

## 2018-09-18 LAB — URINE CULTURE: Culture: NO GROWTH

## 2018-10-05 ENCOUNTER — Encounter: Payer: Self-pay | Admitting: "Endocrinology

## 2018-10-07 ENCOUNTER — Other Ambulatory Visit: Payer: Self-pay

## 2018-10-07 ENCOUNTER — Ambulatory Visit (INDEPENDENT_AMBULATORY_CARE_PROVIDER_SITE_OTHER): Payer: BLUE CROSS/BLUE SHIELD | Admitting: "Endocrinology

## 2018-10-07 ENCOUNTER — Encounter: Payer: Self-pay | Admitting: "Endocrinology

## 2018-10-07 DIAGNOSIS — E1122 Type 2 diabetes mellitus with diabetic chronic kidney disease: Secondary | ICD-10-CM | POA: Diagnosis not present

## 2018-10-07 DIAGNOSIS — Z9119 Patient's noncompliance with other medical treatment and regimen: Secondary | ICD-10-CM | POA: Diagnosis not present

## 2018-10-07 DIAGNOSIS — N186 End stage renal disease: Secondary | ICD-10-CM

## 2018-10-07 DIAGNOSIS — E1165 Type 2 diabetes mellitus with hyperglycemia: Secondary | ICD-10-CM

## 2018-10-07 DIAGNOSIS — IMO0002 Reserved for concepts with insufficient information to code with codable children: Secondary | ICD-10-CM

## 2018-10-07 DIAGNOSIS — I10 Essential (primary) hypertension: Secondary | ICD-10-CM

## 2018-10-07 DIAGNOSIS — E782 Mixed hyperlipidemia: Secondary | ICD-10-CM | POA: Diagnosis not present

## 2018-10-07 DIAGNOSIS — Z91199 Patient's noncompliance with other medical treatment and regimen due to unspecified reason: Secondary | ICD-10-CM | POA: Insufficient documentation

## 2018-10-07 MED ORDER — INSULIN GLARGINE 100 UNIT/ML SOLOSTAR PEN: 50.0000 [IU] | PEN_INJECTOR | Freq: Every day | SUBCUTANEOUS | 2 refills | Status: DC

## 2018-10-07 NOTE — Progress Notes (Signed)
10/07/2018, 4:14 PM                                 Endocrinology Telehealth Visit Follow up Note -During COVID -19 Pandemic  I connected with Raymond [name] on 10/07/2018   by telephone and verified that I am speaking with the correct person using two identifiers. Raymond [name], April 14, 1961. he has verbally consented to this visit. All issues noted in this document were discussed and addressed. The format was not optimal for physical exam.    Subjective:    Patient ID: Raymond [name], male    DOB: 12-01-1960.  Raymond [name] is being engaged in telehealth in follow-up  currently uncontrolled symptomatic complicated type 2 diabetes with end-stage renal disease status post renal transplant, hypertension, hyperlipidemia .   Past Medical History:  Diagnosis Date  . Chronic kidney disease    Awaiting transplant  . Diabetes ([place])   . Diabetes mellitus without complication ([place])   . Hypertension   . Renal disorder    Past Surgical History:  Procedure Laterality Date  . COLONOSCOPY N/A 12/05/2014   [id number] external and internal hemorrhoid/mild diverticulosis/11 polyps removed  . ESOPHAGOGASTRODUODENOSCOPY N/A 12/05/2014   QTM:AUQJ duodenitis  . KIDNEY TRANSPLANT  03/2015   Social History   Socioeconomic History  . Marital status: Married    Spouse name: Not on file  . Number of children: Not on file  . Years of education: Not on file  . Highest education level: Not on file  Occupational History  . Not on file  Social Needs  . Financial resource strain: Not on file  . Food insecurity:    Worry: Not on file    Inability: Not on file  . Transportation needs:    Medical: Not on file    Non-medical: Not on file  Tobacco Use  . Smoking status: Never Smoker  . Smokeless tobacco: Never Used  Substance and Sexual Activity  . Alcohol use: No    Alcohol/week: 0.0 standard drinks  . Drug use: No  . Sexual activity: Not on file  Lifestyle  .  Physical activity:    Days per week: Not on file    Minutes per session: Not on file  . Stress: Not on file  Relationships  . Social connections:    Talks on phone: Not on file    Gets together: Not on file    Attends religious service: Not on file    Active member of club or organization: Not on file    Attends meetings of clubs or organizations: Not on file    Relationship status: Not on file  Other Topics Concern  . Not on file  Social History Narrative   ** Merged History Encounter **       Outpatient Encounter Medications as of 10/07/2018  Medication Sig  . acetaminophen (TYLENOL) 500 MG tablet Take 500 mg by mouth every 6 (six) hours as needed.  [name] amLODipine (NORVASC) 5 MG tablet Take by mouth.  [name] atorvastatin (LIPITOR) 40 MG tablet Take by mouth.  . azaTHIOprine (IMURAN) 50 MG tablet   . bisacodyl (BISACODYL) 5 MG EC tablet Take 5 mg by mouth daily as needed for moderate constipation.  . Blood Glucose Monitoring Suppl (ACCU-CHEK GUIDE ME) w/Device KIT 1 Piece by Does not apply route as directed.  . Brinzolamide-Brimonidine (SIMBRINZA) 1-0.2 % SUSP  Apply to eye.  . cloNIDine (CATAPRES - DOSED IN MG/24 HR) 0.3 mg/24hr patch Place 0.3 mg onto the skin once a week.  . diazepam (VALIUM) 5 MG tablet Take 0.5 tablets (2.5 mg total) by mouth every 6 (six) hours as needed for anxiety (spasms).  . doxazosin (CARDURA) 4 MG tablet Take by mouth.  . doxycycline (VIBRA-TABS) 100 MG tablet Take 1 tablet (100 mg total) by mouth 2 (two) times daily.  [name] glipiZIDE (GLUCOTROL XL) 5 MG 24 hr tablet Take by mouth.  [name] glucose blood (ACCU-CHEK GUIDE) test strip Use as instructed 4 x daily. E11.65  . hydrALAZINE (APRESOLINE) 25 MG tablet Take 25 mg by mouth 2 (two) times daily.  . hydrOXYzine (ATARAX/VISTARIL) 25 MG tablet Take 25 mg by mouth 2 (two) times daily as needed.  . Insulin Glargine (LANTUS SOLOSTAR) 100 UNIT/ML Solostar Pen Inject 50 Units into the skin at bedtime.  . Insulin Pen Needle  (B-D ULTRAFINE III SHORT PEN) 31G X 8 MM MISC 1 each by Does not apply route as directed.  . Multiple Vitamin (MULTIVITAMIN) capsule Take 1 capsule by mouth daily.  . mupirocin ointment (BACTROBAN) 2 % Apply 1 application topically 2 (two) times daily.  . mycophenolate (MYFORTIC) 360 MG TBEC EC tablet Take by mouth.  [name] omeprazole (PRILOSEC) 20 MG capsule Take by mouth.  . simethicone (MYLICON) 80 MG chewable tablet Chew 80 mg by mouth every 6 (six) hours as needed for flatulence.  . sodium bicarbonate 650 MG tablet Take 1,300 mg by mouth 2 (two) times daily.  . tacrolimus (PROGRAF) 1 MG capsule Take 2 mg by mouth 2 (two) times daily. 2 capsules every morning, 2 capsules in the evening  . traMADol (ULTRAM) 50 MG tablet Take 1 tablet (50 mg total) by mouth every 6 (six) hours as needed.  . Vitamin D, Ergocalciferol, (DRISDOL) 50000 units CAPS capsule Take 50,000 Units by mouth every 30 (thirty) days.  . [DISCONTINUED] Insulin Glargine (LANTUS SOLOSTAR) 100 UNIT/ML Solostar Pen Inject 40 Units into the skin at bedtime.   No facility-administered encounter medications on file as of 10/07/2018.     ALLERGIES: No Known Allergies  VACCINATION STATUS: Immunization History  Administered Date(s) Administered  . Influenza-Unspecified 03/05/2015, 03/09/2016    Diabetes  He presents for his follow-up diabetic visit. He has type 2 diabetes mellitus. Onset time: He was diagnosed at approximate age of 67 years. His disease course has been worsening (Patient was seen in September 2019, was supposed to return 10 days from his last visit.  He continues to no-show and canceled several appointments.  His recent labs show A1c still high at 12.4%.). There are no hypoglycemic associated symptoms. Pertinent negatives for hypoglycemia include no confusion, headaches, pallor or seizures. Associated symptoms include blurred vision, polydipsia and polyuria. Pertinent negatives for diabetes include no chest pain, no  fatigue, no polyphagia and no weakness. There are no hypoglycemic complications. Symptoms are worsening. Diabetic complications include nephropathy and retinopathy. (He had end-stage renal disease as a complication, status post cadaveric kidney transplant in 2016.) Risk factors for coronary artery disease include dyslipidemia, diabetes mellitus, family history, hypertension, male sex, obesity and sedentary lifestyle. He is compliant with treatment none of the time. His weight is fluctuating minimally. He is following a generally unhealthy diet. When asked about meal planning, he reported none. He has not had a previous visit with a dietitian. He never participates in exercise. (Patient has not been monitoring blood glucose, his A1c recently was 12.4%.)  An ACE inhibitor/angiotensin II receptor blocker is not being taken.  Hyperlipidemia  This is a chronic problem. The current episode started more than 1 year ago. Exacerbating diseases include chronic renal disease, diabetes and obesity. Pertinent negatives include no chest pain, myalgias or shortness of breath. Current antihyperlipidemic treatment includes statins (Is currently on NPH 40 units nightly.). Risk factors for coronary artery disease include diabetes mellitus, dyslipidemia, family history, obesity, male sex, hypertension and a sedentary lifestyle.  Hypertension  This is a chronic problem. The current episode started more than 1 year ago. The problem is controlled. Associated symptoms include blurred vision. Pertinent negatives include no chest pain, headaches, neck pain, palpitations or shortness of breath. Risk factors for coronary artery disease include dyslipidemia, diabetes mellitus, male gender, obesity, sedentary lifestyle and family history. Past treatments include central alpha agonists. Hypertensive end-organ damage includes kidney disease and retinopathy. Patient is status post kidney transplant in 2016.. Identifiable causes of hypertension  include chronic renal disease.   ROS: Limited as above.    Objective:    There were no vitals taken for this visit.  Wt Readings from Last 3 Encounters:  09/17/18 216 lb (98 kg)  09/03/18 219 lb (99.3 kg)  07/27/18 229 lb 6.4 oz (104.1 kg)      Recent Results (from the past 2160 hour(s))  Basic metabolic panel     Status: Abnormal   Collection Time: 07/26/18 12:00 AM  Result Value Ref Range   BUN 25 (A) 4 - 21   Creatinine 1.3 0.6 - 1.3  Hemoglobin A1c     Status: None   Collection Time: 07/26/18 12:00 AM  Result Value Ref Range   Hemoglobin A1C 12.4   CBC with Differential     Status: Abnormal   Collection Time: 09/17/18  9:10 AM  Result Value Ref Range   WBC 9.5 4.0 - 10.5 K/uL   RBC 4.14 (L) 4.22 - 5.81 MIL/uL   Hemoglobin 10.7 (L) 13.0 - 17.0 g/dL   HCT 35.1 (L) 39.0 - 52.0 %   MCV 84.8 80.0 - 100.0 fL   MCH 25.8 (L) 26.0 - 34.0 pg   MCHC 30.5 30.0 - 36.0 g/dL   RDW 13.1 11.5 - 15.5 %   Platelets 209 150 - 400 K/uL   nRBC 0.0 0.0 - 0.2 %   Neutrophils Relative % 74 %   Neutro Abs 7.0 1.7 - 7.7 K/uL   Lymphocytes Relative 13 %   Lymphs Abs 1.3 0.7 - 4.0 K/uL   Monocytes Relative 10 %   Monocytes Absolute 0.9 0.1 - 1.0 K/uL   Eosinophils Relative 2 %   Eosinophils Absolute 0.2 0.0 - 0.5 K/uL   Basophils Relative 0 %   Basophils Absolute 0.0 0.0 - 0.1 K/uL   Immature Granulocytes 1 %   Abs Immature Granulocytes 0.09 (H) 0.00 - 0.07 K/uL    Comment: Performed at [facility], [address] [place], [place] [address]  Comprehensive metabolic panel     Status: Abnormal   Collection Time: 09/17/18  9:10 AM  Result Value Ref Range   Sodium 135 135 - 145 mmol/L   Potassium 4.3 3.5 - 5.1 mmol/L   Chloride 102 98 - 111 mmol/L   CO2 26 22 - 32 mmol/L   Glucose, Bld 242 (H) 70 - 99 mg/dL   BUN 39 (H) 6 - 20 mg/dL   Creatinine, Ser 1.39 (H) 0.61 - 1.24 mg/dL   Calcium 9.2 8.9 - 10.3 mg/dL  Total Protein 7.8 6.5 - 8.1 g/dL   Albumin 2.6 (L) 3.5 - 5.0  g/dL   AST 13 (L) 15 - 41 U/L   ALT 15 0 - 44 U/L   Alkaline Phosphatase 92 38 - 126 U/L   Total Bilirubin 0.5 0.3 - 1.2 mg/dL   GFR calc non Af Amer 56 (L) >60 mL/min   GFR calc Af Amer >60 >60 mL/min   Anion gap 7 5 - 15    Comment: Performed at [facility], [address] [place], [place] [address]  Urinalysis, Complete w Microscopic     Status: Abnormal   Collection Time: 09/17/18  9:10 AM  Result Value Ref Range   Color, Urine YELLOW (A) YELLOW   APPearance CLEAR (A) CLEAR   Specific Gravity, Urine 1.028 1.005 - 1.030   pH 5.0 5.0 - 8.0   Glucose, UA 150 (A) NEGATIVE mg/dL   Hgb urine dipstick NEGATIVE NEGATIVE   Bilirubin Urine NEGATIVE NEGATIVE   Ketones, ur NEGATIVE NEGATIVE mg/dL   Protein, ur 30 (A) NEGATIVE mg/dL   Nitrite NEGATIVE NEGATIVE   Leukocytes,Ua NEGATIVE NEGATIVE   RBC / HPF 0-5 0 - 5 RBC/hpf   WBC, UA 0-5 0 - 5 WBC/hpf   Bacteria, UA NONE SEEN NONE SEEN   Squamous Epithelial / LPF NONE SEEN 0 - 5   Mucus PRESENT     Comment: Performed at [facility], [address] [place], [place] [address]  Urine Culture     Status: None   Collection Time: 09/17/18  9:10 AM  Result Value Ref Range   Specimen Description      URINE, RANDOM Performed at [facility], [address] [place], [place] [address]    Special Requests      NONE Performed at [facility], [address] [place], [place] [address]    Culture      NO GROWTH Performed at [facility], [place] [address] [place], [place] [address]    Report Status 09/18/2018 FINAL       Assessment & Plan:   1. 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) ([place]) status post cadaveric renal transplant 2016 - Raymond [name] has currently uncontrolled symptomatic type 2 DM since 58 years of age,  with most recent A1c of 12.4% largely unchanged from his last visit A1c of 13.6%. Recent labs reviewed.  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  He remains uncommitted and noncompliant to follow-up and recommendations. He is  status post renal transplant, retinopathy, obesity/sedentary life and he remains at extremely high risk for more acute and chronic complications which include CAD, CVA, CKD, retinopathy, and neuropathy. These are all discussed in detail with him.  - Patient admits there is a room for improvement in his diet and drink choices. -  Suggestion is made for him to avoid simple carbohydrates  from his diet including Cakes, Sweet Desserts / Pastries, Ice Cream, Soda (diet and regular), Sweet Tea, Candies, Chips, Cookies, Store Bought Juices, Alcohol in Excess of  1-2 drinks a day, Artificial Sweeteners, and "Sugar-free" Products. This will help patient to have stable blood glucose profile and potentially avoid unintended weight gain.   - I encouraged him to switch to  unprocessed or minimally processed complex starch and increased protein intake (animal or plant source), fruits, and vegetables.  - he is  advised to stick to a routine mealtimes to eat 3 meals  a day and avoid unnecessary snacks ( to snack only to correct hypoglycemia).   - he has been scheduled with Jearld Fenton, RDN, CDE for individualized diabetes education.  - I have approached him with the following individualized plan to manage diabetes and patient agrees:   -Given his current and prevailing glycemic burden and extremely high risk of complications, he will likely require intensive treatment with basal/bolus insulin in order for him to achieve control of diabetes to target.   -Unfortunately patient's reluctance and lack of engagement makes it difficult to give him optimal adjustment of treatment. -I discussed and resumed his basal insulin Lantus at 50 units nightly, associated with monitoring of blood glucose 4 times a day-before meals and at bedtime  and reconnect in 10 days for treatment adjustment.  -He will be considered for NovoLog or Humalog if he returns with above target postprandial hyperglycemia. - he is encouraged to call clinic for blood glucose levels less than 70 or above 300 mg /dl. -He is not a suitable candidate for metformin, SGLT2 inhibitors, nor incretin therapy. -He is advised to continue glipizide 5 mg XL daily at breakfast. -He is urged about the need for strict follow-up until his his insulin regimen is stabilized.   - Patient specific target  A1c;  LDL, HDL, Triglycerides, and  Waist Circumference were discussed in detail.  2) BP/HTN: he is advised to home monitor blood pressure and report if > 140/90 on 2 separate readings.   he is advised to continue his current medications including clonidine 0.3 mg patch weekly, and hydralazine 25 mg p.o. twice daily.    3) Lipids/HPL: No recent lipid panel to review.  He will be considered for fasting lipid panel on subsequent visits.  In the meantime, he is advised to continue atorvastatin 40 mg p.o. nightly. Side effects and precautions discussed with him.  4)  Weight/Diet:  I discussed with him the fact that loss of 5 - 10% of his  current body weight will have the most impact on his diabetes management.  CDE Consult will be initiated . Exercise, and detailed carbohydrates information provided  -  detailed on discharge instructions.  5) Chronic Care/Health Maintenance:  -he  is not on ACEI/ARB , he is on Statin medications and  is encouraged to initiate and continue to follow up with Ophthalmology, Dentist,  Podiatrist at least yearly or according to recommendations, and advised to  stay away from smoking. I have recommended yearly flu vaccine and pneumonia vaccine at least every 5 years; moderate intensity exercise for up to 150 minutes weekly; and  sleep for at least 7 hours a day.  - I advised patient to maintain close follow up with Patient, No Pcp Per for primary care  needs.  - Time spent with the patient: 25 min, of which >50% was spent in reviewing his  current and  previous labs/studies, his blood glucose readings, previous treatments, and medications doses and developing a plan for long-term care based on the latest recommendations for standards of care. Please refer to " Patient Self Inventory" in the Media  tab for reviewed elements of pertinent patient history. Raymond [name] participated in the discussions, expressed understanding, and voiced agreement with the above plans.  All questions were answered to his satisfaction. he is encouraged to contact clinic should he have any questions or concerns prior to his return visit.   Follow up plan: - Return  in about 10 days (around 10/17/2018).  Glade Lloyd, MD [facility] [address] [place], [place] [address] Phone: [phone]  Fax: [phone]    10/07/2018, 4:14 PM  This note was partially dictated with voice recognition software. Similar sounding words can be transcribed inadequately or may not  be corrected upon review.

## 2018-10-11 ENCOUNTER — Other Ambulatory Visit: Payer: Self-pay

## 2018-10-11 MED ORDER — INSULIN GLARGINE 100 UNIT/ML SOLOSTAR PEN: 50.0000 [IU] | PEN_INJECTOR | Freq: Every day | SUBCUTANEOUS | 2 refills | Status: DC

## 2018-10-21 ENCOUNTER — Ambulatory Visit: Payer: BLUE CROSS/BLUE SHIELD | Admitting: "Endocrinology

## 2018-10-22 ENCOUNTER — Telehealth: Payer: Self-pay | Admitting: *Deleted

## 2018-10-22 ENCOUNTER — Telehealth: Payer: Self-pay | Admitting: Cardiovascular Disease

## 2018-10-22 NOTE — Telephone Encounter (Signed)

## 2018-10-25 NOTE — Telephone Encounter (Signed)
Called x3 for pre reg/LVM °

## 2018-10-26 ENCOUNTER — Other Ambulatory Visit: Payer: Self-pay

## 2018-10-26 ENCOUNTER — Telehealth: Payer: Self-pay | Admitting: *Deleted

## 2018-10-26 ENCOUNTER — Telehealth: Payer: BLUE CROSS/BLUE SHIELD | Admitting: Cardiovascular Disease

## 2018-10-26 NOTE — Telephone Encounter (Signed)
LVM for patient to call and reschedule his appointment.

## 2018-10-26 NOTE — Progress Notes (Unsigned)
Virtual Visit via Video Note   This visit type was conducted due to national recommendations for restrictions regarding the COVID-19 Pandemic (e.g. social distancing) in an effort to limit this patient's exposure and mitigate transmission in our community.  Due to his co-morbid illnesses, this patient is at least at moderate risk for complications without adequate follow up.  This format is felt to be most appropriate for this patient at this time.  All issues noted in this document were discussed and addressed.  A limited physical exam was performed with this format.  Please refer to the patient's chart for his consent to telehealth for [facility].   Date:  10/26/2018   ID:  [name], DOB 11/08/1960, MRN [id number]  Patient Location: Home Provider Location: Home  PCP:  Patient, No Pcp Per  Cardiologist: Dr. Joanell Rising Electrophysiologist:  None   Evaluation Performed:  Follow-Up Visit  Chief Complaint: Peripheral arterial disease  History of Present Illness:    Jabarie Pop is a 58 y.o. moderately overweight separated African-American male father of 2 children, grandfather of 2 grandchildren who works as an Network engineer.  He is accompanied by his girlfriend Hassan Rowan and was referred by Dr. Jacqualyn Posey, his podiatrist for peripheral vascular valuation because of a small ulcer on the plantar surface of his right first metatarsal.    I last saw him in the office 07/27/2018.  He has a history of treated hypertension, diabetes and hyperlipidemia.  His father had an MI at age 9 and his brother recently had a myocardial infarction as well.  He is never had a heart attack or stroke.  He denies chest pain or shortness of breath.  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  He apparently stepped on a nail approximately 2 to 3 months ago and developed a wound on the plantar  surface of his right first metatarsal which Dr. Jacqualyn Posey has been treating fairly frequently over the last several months.  Apparently this is slowly healing.  He had Dopplers performed in [place] recently that were told showed normal circulation.  The patient {does/does not:200015} have symptoms concerning for COVID-19 infection (fever, chills, cough, or new shortness of breath).    Past Medical History:  Diagnosis Date   Chronic kidney disease    Awaiting transplant   Diabetes ([place])    Diabetes mellitus without complication ([place])    Hypertension    Renal disorder    Past Surgical History:  Procedure Laterality Date   COLONOSCOPY N/A 12/05/2014   [id number] external and internal hemorrhoid/mild diverticulosis/11 polyps removed   ESOPHAGOGASTRODUODENOSCOPY N/A 12/05/2014   ZJQ:BHAL duodenitis   KIDNEY TRANSPLANT  03/2015     No outpatient medications have been marked as taking for the 10/26/18 encounter (Appointment) with Lorretta Harp, MD.     Allergies:   Patient has no known allergies.   Social History   Tobacco Use   Smoking status: Never Smoker   Smokeless tobacco: Never Used  Substance Use Topics   Alcohol use: No    Alcohol/week: 0.0 standard drinks   Drug use: No     Family Hx: The patient's family history includes Diabetes in his mother. There is no history of Colon cancer.  ROS:   Please see the history of present illness.    *** All other systems reviewed and are negative.   Prior CV studies:  The following studies were reviewed today:  ***  Labs/Other Tests and Data Reviewed:    EKG:  {EKG/Telemetry Strips [phone]}  Recent Labs: 09/17/2018: ALT 15; BUN 39; Creatinine, Ser 1.39; Hemoglobin 10.7; Platelets 209; Potassium 4.3; Sodium 135   Recent Lipid Panel No results found for: CHOL, TRIG, HDL, CHOLHDL, LDLCALC, LDLDIRECT  Wt Readings from Last 3 Encounters:  09/17/18 216 lb (98 kg)  09/03/18 219 lb (99.3 kg)    07/27/18 229 lb 6.4 oz (104.1 kg)     Objective:    Vital Signs:  There were no vitals taken for this visit.   {[facility] Virtual Exam (Optional):[phone]::"VITAL SIGNS:  reviewed"}  ASSESSMENT & PLAN:    1. ***  COVID-19 Education: The signs and symptoms of COVID-19 were discussed with the patient and how to seek care for testing (follow up with PCP or arrange E-visit).  ***The importance of social distancing was discussed today.  Time:   Today, I have spent *** minutes with the patient with telehealth technology discussing the above problems.     Medication Adjustments/Labs and Tests Ordered: Current medicines are reviewed at length with the patient today.  Concerns regarding medicines are outlined above.   Tests Ordered: No orders of the defined types were placed in this encounter.   Medication Changes: No orders of the defined types were placed in this encounter.   Disposition:  Follow up {follow up:15908}  Signed, Quay Burow, MD  10/26/2018 7:49 AM    [facility]

## 2018-12-13 ENCOUNTER — Ambulatory Visit (INDEPENDENT_AMBULATORY_CARE_PROVIDER_SITE_OTHER): Payer: BC Managed Care – PPO

## 2018-12-13 ENCOUNTER — Other Ambulatory Visit: Payer: Self-pay

## 2018-12-13 ENCOUNTER — Ambulatory Visit (INDEPENDENT_AMBULATORY_CARE_PROVIDER_SITE_OTHER): Payer: BC Managed Care – PPO | Admitting: Podiatry

## 2018-12-13 VITALS — Temp 97.8°F

## 2018-12-13 DIAGNOSIS — M216X1 Other acquired deformities of right foot: Secondary | ICD-10-CM | POA: Diagnosis not present

## 2018-12-13 DIAGNOSIS — E0843 Diabetes mellitus due to underlying condition with diabetic autonomic (poly)neuropathy: Secondary | ICD-10-CM

## 2018-12-13 DIAGNOSIS — L97509 Non-pressure chronic ulcer of other part of unspecified foot with unspecified severity: Secondary | ICD-10-CM

## 2018-12-13 DIAGNOSIS — L97519 Non-pressure chronic ulcer of other part of right foot with unspecified severity: Secondary | ICD-10-CM | POA: Diagnosis not present

## 2018-12-13 DIAGNOSIS — L84 Corns and callosities: Secondary | ICD-10-CM | POA: Diagnosis not present

## 2018-12-13 DIAGNOSIS — M79671 Pain in right foot: Secondary | ICD-10-CM

## 2018-12-13 NOTE — Progress Notes (Signed)
Subjective: 58 year old male presents the office today for concerns of possible recurrence of a wound on the right foot submetatarsal 1.  Denies any drainage or pus no swelling or redness.  He states that he noticed that over the last week or so he went to have the area checked.  He said no recent treatment.  No other concerns.  No recent injury or falls. Denies any systemic complaints such as fevers, chills, nausea, vomiting. No acute changes since last appointment, and no other complaints at this time.   Objective: AAO x3, NAD DP/PT pulses palpable bilaterally, CRT less than 3 seconds On the right foot submetatarsal 1 of the foot hyperkeratotic lesion with evidence of dried blood.  Upon debridement there is no definitive skin breakdown but there is macerated tissue present.  There is no probing, undermining or tunneling.  No fluctuation crepitation.  No drainage or pus.  No open lesions or pre-ulcerative lesions.  No pain with calf compression, swelling, warmth, erythema  Assessment: Pre-ulcerative callus  Plan: -All treatment options discussed with the patient including all alternatives, risks, complications.  -X-rays obtained reviewed.  No evidence of acute cortical destruction suggestive of osteomyelitis.  No soft tissue emphysema. -Patient instructed debrided with a #[phone] with scalpel and complications of bleeding today.  Recommend small meta Betadine dressing changes daily.  I want to go back to wearing the offloading shoe that he has at home.  Monitor for any further skin breakdown or any signs or symptoms of infection.  I will see him back in 2 weeks or sooner if needed. -Patient encouraged to call the office with any questions, concerns, change in symptoms.   Trula Slade DPM

## 2018-12-14 ENCOUNTER — Other Ambulatory Visit: Payer: Self-pay | Admitting: Podiatry

## 2018-12-14 DIAGNOSIS — L97509 Non-pressure chronic ulcer of other part of unspecified foot with unspecified severity: Secondary | ICD-10-CM

## 2018-12-27 ENCOUNTER — Encounter: Payer: Self-pay | Admitting: *Deleted

## 2018-12-27 ENCOUNTER — Encounter (HOSPITAL_COMMUNITY): Payer: Self-pay | Admitting: Emergency Medicine

## 2018-12-27 ENCOUNTER — Emergency Department (HOSPITAL_COMMUNITY)
Admission: EM | Admit: 2018-12-27 | Discharge: 2018-12-27 | Disposition: A | Discharge status: Home / Self Care | Payer: BC Managed Care – PPO | Attending: Emergency Medicine | Admitting: Emergency Medicine

## 2018-12-27 ENCOUNTER — Emergency Department
Admission: EM | Admit: 2018-12-27 | Discharge: 2018-12-27 | Disposition: A | Discharge status: Home / Self Care | Payer: BC Managed Care – PPO | Attending: Emergency Medicine | Admitting: Emergency Medicine

## 2018-12-27 ENCOUNTER — Other Ambulatory Visit: Payer: Self-pay

## 2018-12-27 ENCOUNTER — Emergency Department: Payer: BC Managed Care – PPO

## 2018-12-27 DIAGNOSIS — Z79899 Other long term (current) drug therapy: Secondary | ICD-10-CM | POA: Insufficient documentation

## 2018-12-27 DIAGNOSIS — Z794 Long term (current) use of insulin: Secondary | ICD-10-CM | POA: Insufficient documentation

## 2018-12-27 DIAGNOSIS — M436 Torticollis: Secondary | ICD-10-CM | POA: Diagnosis not present

## 2018-12-27 DIAGNOSIS — N186 End stage renal disease: Secondary | ICD-10-CM | POA: Insufficient documentation

## 2018-12-27 DIAGNOSIS — E782 Mixed hyperlipidemia: Secondary | ICD-10-CM | POA: Diagnosis not present

## 2018-12-27 DIAGNOSIS — Z940 Kidney transplant status: Secondary | ICD-10-CM | POA: Diagnosis not present

## 2018-12-27 DIAGNOSIS — E113511 Type 2 diabetes mellitus with proliferative diabetic retinopathy with macular edema, right eye: Secondary | ICD-10-CM | POA: Diagnosis not present

## 2018-12-27 DIAGNOSIS — I120 Hypertensive chronic kidney disease with stage 5 chronic kidney disease or end stage renal disease: Secondary | ICD-10-CM | POA: Diagnosis not present

## 2018-12-27 DIAGNOSIS — E1122 Type 2 diabetes mellitus with diabetic chronic kidney disease: Secondary | ICD-10-CM | POA: Insufficient documentation

## 2018-12-27 DIAGNOSIS — M542 Cervicalgia: Secondary | ICD-10-CM | POA: Insufficient documentation

## 2018-12-27 DIAGNOSIS — Z992 Dependence on renal dialysis: Secondary | ICD-10-CM | POA: Diagnosis not present

## 2018-12-27 MED ORDER — CYCLOBENZAPRINE HCL 10 MG PO TABS: 10.0000 mg | ORAL_TABLET | Freq: Two times a day (BID) | ORAL | 0 refills | Status: DC | PRN

## 2018-12-27 MED ORDER — OXYCODONE HCL 5 MG PO TABS: 5.0000 mg | ORAL_TABLET | Freq: Three times a day (TID) | ORAL | 0 refills | Status: DC | PRN

## 2018-12-27 MED ORDER — OXYCODONE HCL 5 MG PO TABS
5.0000 mg | ORAL_TABLET | Freq: Once | ORAL | Status: AC
  Administered 2018-12-27: 5 mg via ORAL
  Filled 2018-12-27: qty 1

## 2018-12-27 NOTE — ED Triage Notes (Signed)
Pt reporting neck pain that started Saturday. Pain is worse with movement. No injury that pt is aware of but pt is reporting the pain started upon waking on Saturday. Decreased ROM due to pain .

## 2018-12-27 NOTE — Discharge Instructions (Signed)
Please follow-up with your primary care provider symptoms this week.  Return to the emergency department for symptoms of change or worsen if you are unable to schedule appointment.

## 2018-12-27 NOTE — ED Provider Notes (Signed)
[facility] EMERGENCY DEPARTMENT Provider Note   CSN: [id number] Arrival date & time: 12/27/18  0818    History   Chief Complaint Chief Complaint  Patient presents with  . Neck Pain    HPI Raymond [name] is a 58 y.o. male.     HPI   58 year old male presents today with complaints of neck pain.  Patient notes 2 days ago he woke up with soreness in the bilateral upper neck.  He notes this is worse with movements, improved with rest.  He denies any headache, dizziness, neurological deficits, anterior neck pain, chest pain or shortness of breath.  He denies trauma.  Patient notes history hypertension and did take his blood pressure medication this morning.  Patient notes taking Tylenol which has not improved his symptoms.  Past Medical History:  Diagnosis Date  . Chronic kidney disease    Awaiting transplant  . Diabetes ([place])   . Diabetes mellitus without complication ([place])   . Hypertension   . Renal disorder     Patient Active Problem List   Diagnosis Date Noted  . Personal history of noncompliance with medical treatment, presenting hazards to health 10/07/2018  . Critical limb ischemia with history of revascularization of same extremity 07/27/2018  . Mixed hyperlipidemia 02/19/2018  . Class 1 obesity due to excess calories with serious comorbidity and body mass index (BMI) of 31.0 to 31.9 in adult 02/19/2018  . History of renal transplant 02/19/2018  . Bilateral impacted cerumen 08/31/2017  . Conductive hearing loss, bilateral 08/31/2017  . Nuclear sclerotic cataract of right eye 10/07/2016  . Pseudophakia of left eye 10/07/2016  . Hypertension due to endocrine disorder 03/04/2016  . 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 Renal failure 03/04/2016  . Acute kidney injury ([place]) 12/10/2015  . Immunosuppression ([place]) 12/10/2015  . Nausea vomiting and diarrhea  12/10/2015  . 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 Essential hypertension, benign 12/10/2015  . ARF (acute renal failure) ([place]) 12/10/2015  . Pancytopenia ([place]) 12/10/2015  . Encounter for screening colonoscopy 11/15/2014  . GERD (gastroesophageal reflux disease) 11/15/2014    Past Surgical History:  Procedure Laterality Date  . COLONOSCOPY N/A 12/05/2014   [id number]:[id number] external and internal hemorrhoid/mild diverticulosis/11 polyps removed  . ESOPHAGOGASTRODUODENOSCOPY N/A 12/05/2014   VVZ:SMOL duodenitis  . KIDNEY TRANSPLANT  03/2015        Home Medications    Prior to Admission medications   Medication Sig Start Date End Date Taking? Authorizing Provider  amLODipine (NORVASC) 5 MG tablet Take by mouth.    [provider]  atorvastatin (LIPITOR) 40 MG tablet Take by mouth.    [provider]  azaTHIOprine (IMURAN) 50 MG tablet  05/20/18   [provider]  Blood Glucose Monitoring Suppl (ACCU-CHEK GUIDE ME) w/Device KIT 1 Piece by Does not apply route as directed. 02/18/18   [name], MD  cloNIDine (CATAPRES - DOSED IN MG/24 HR) 0.3 mg/24hr patch Place 0.3 mg onto the skin once a week.    [provider]  cyclobenzaprine (FLEXERIL) 10 MG tablet Take 1 tablet (10 mg total) by mouth 2 (two) times daily as needed for muscle spasms. 12/27/18   Trust Crago, Dellis Filbert, PA-C  doxazosin (CARDURA) 4 MG tablet Take by mouth. 12/18/15   [provider]  doxycycline (VIBRA-TABS) 100 MG tablet Take 1 tablet (100 mg total) by mouth 2 (  two) times daily. 08/30/18   Trula Slade, DPM  glipiZIDE (GLUCOTROL XL) 5 MG 24 hr tablet Take by mouth.    [provider]  glucose blood (ACCU-CHEK GUIDE) test strip Use as instructed 4 x daily. E11.65 02/19/18   [name], MD  hydrALAZINE (APRESOLINE) 25 MG tablet Take 25 mg by mouth 2 (two) times daily.    [provider]   hydrOXYzine (ATARAX/VISTARIL) 25 MG tablet Take 25 mg by mouth 2 (two) times daily as needed.    [provider]  Insulin Glargine (LANTUS SOLOSTAR) 100 UNIT/ML Solostar Pen Inject 50 Units into the skin at bedtime. 10/11/18   [name], MD  Insulin Pen Needle (B-D ULTRAFINE III SHORT PEN) 31G X 8 MM MISC 1 each by Does not apply route as directed. 02/18/18   [name], MD  Multiple Vitamin (MULTIVITAMIN) capsule Take 1 capsule by mouth daily.    [provider]  mupirocin ointment (BACTROBAN) 2 % Apply 1 application topically 2 (two) times daily. 03/01/18   Trula Slade, DPM  omeprazole (PRILOSEC) 20 MG capsule Take by mouth. 11/15/14   [provider]  sodium bicarbonate 650 MG tablet Take 1,300 mg by mouth 3 (three) times daily. 11/02/18   [provider]  tacrolimus (PROGRAF) 1 MG capsule Take 2 mg by mouth 2 (two) times daily. 2 capsules every morning, 2 capsules in the evening    [provider]  Vitamin D, Ergocalciferol, (DRISDOL) 50000 units CAPS capsule Take 50,000 Units by mouth every 30 (thirty) days.    [provider]    Family History Family History  Problem Relation Age of Onset  . Diabetes Mother   . Colon cancer Neg Hx     Social History Social History   Tobacco Use  . Smoking status: Never Smoker  . Smokeless tobacco: Never Used  Substance Use Topics  . Alcohol use: No    Alcohol/week: 0.0 standard drinks  . Drug use: No     Allergies   Patient has no known allergies.   Review of Systems Review of Systems  All other systems reviewed and are negative.    Physical Exam Updated Vital Signs BP (!) 177/90   Pulse (!) 53   Temp 97.7 F (36.5 C) (Oral)   Ht '5\' 7"'  (1.702 m)   Wt 102.5 kg   SpO2 98%   BMI 35.40 kg/m   Physical Exam Vitals signs and nursing note reviewed.  Constitutional:      Appearance: He is well-developed.  HENT:     Head: Normocephalic and atraumatic.   Eyes:     General: No scleral icterus.       Right eye: No discharge.        Left eye: No discharge.     Conjunctiva/sclera: Conjunctivae normal.     Pupils: Pupils are equal, round, and reactive to light.  Neck:     Musculoskeletal: Normal range of motion.     Vascular: No JVD.     Trachea: No tracheal deviation.     Comments: Tenderness palpation of the left lateral cervical musculature, pain with forward flexion and bilateral lateral movements-no carotid bruits, neck is supple with full active range of motion Cardiovascular:     Rate and Rhythm: Normal rate and regular rhythm.  Pulmonary:     Effort: Pulmonary effort is normal.     Breath sounds: No stridor.  Neurological:     General: No focal  deficit present.     Mental Status: He is alert and oriented to person, place, and time.     Coordination: Coordination normal.  Psychiatric:        Behavior: Behavior normal.        Thought Content: Thought content normal.        Judgment: Judgment normal.      ED Treatments / Results  Labs (all labs ordered are listed, but only abnormal results are displayed) Labs Reviewed - No data to display  EKG None  Radiology No results found.  Procedures Procedures (including critical care time)  Medications Ordered in ED Medications - No data to display   Initial Impression / Assessment and Plan / ED Course  I have reviewed the triage vital signs and the nursing notes.  Pertinent labs & imaging results that were available during my care of the patient were reviewed by me and considered in my medical decision making (see chart for details).        58 year old male presents today with complaints of neck pain.  High suspicion for muscular etiology.  He has no signs of vascular etiology, no signs of infection.  Patient hypertensive here, he is encouraged to continue using his antihypertensive medications call follow-up closely with his primary care provider for management of  ongoing hypertension.  Patient notes he is seen in [place] for his renal transplant encouraged to contact them for blood pressure management as well.  Patient given strict return precautions, he verbalized understanding and agreement to today's plan had no further questions or concerns at the time of discharge.  Final Clinical Impressions(s) / ED Diagnoses   Final diagnoses:  Neck pain    ED Discharge Orders         Ordered    cyclobenzaprine (FLEXERIL) 10 MG tablet  2 times daily PRN     12/27/18 0911           Okey Regal, PA-C 12/27/18 0912    Deno Etienne, DO 12/27/18 1136

## 2018-12-27 NOTE — ED Notes (Signed)
Pt given dc instructions pt verbalizes understanding.  

## 2018-12-27 NOTE — ED Notes (Signed)
Wife is emergency contact and requesting to be called and screened when pt is in a room.

## 2018-12-27 NOTE — ED Notes (Signed)
Patient declined discharge instructions, stating, "I'm mad as hell. I've been waiting to leave for an hour." Wife at bedside. Patient left cell phone on bed upon discharge. Cell phone was returned by this Probation officer.

## 2018-12-27 NOTE — ED Triage Notes (Signed)
Pt co neck pain x3 days that is sore in the back all the way across from ear to ear. Pt rates pain 9/10. Pt limited turning his head side to side and it hurts when he tries to lean head back. Denies hurting his neck.

## 2018-12-27 NOTE — Discharge Instructions (Addendum)
Please read attached information. If you experience any new or worsening signs or symptoms please return to the emergency room for evaluation. Please follow-up with your primary care provider or specialist as discussed. Please use medication prescribed only as directed and discontinue taking if you have any concerning signs or symptoms.   °

## 2018-12-31 NOTE — ED Provider Notes (Signed)
[facility] Emergency Department Provider Note ____________________________________________  Time seen: Approximately 10:52 PM  I have reviewed the triage vital signs and the nursing notes.   HISTORY  Chief Complaint Neck Pain    HPI Raymond [name] is a 58 y.o. male who presents to the emergency department for evaluation and treatment of nontraumatic neck pain.  Pain started Saturday.  He was evaluated earlier by primary care and was given Flexeril.  He states he is not had any relief.  Past Medical History:  Diagnosis Date  . Chronic kidney disease    Awaiting transplant  . Diabetes ([place])   . Diabetes mellitus without complication ([place])   . Hypertension   . Renal disorder     Patient Active Problem List   Diagnosis Date Noted  . Personal history of noncompliance with medical treatment, presenting hazards to health 10/07/2018  . Critical limb ischemia with history of revascularization of same extremity 07/27/2018  . Mixed hyperlipidemia 02/19/2018  . Class 1 obesity due to excess calories with serious comorbidity and body mass index (BMI) of 31.0 to 31.9 in adult 02/19/2018  . History of renal transplant 02/19/2018  . Bilateral impacted cerumen 08/31/2017  . Conductive hearing loss, bilateral 08/31/2017  . Nuclear sclerotic cataract of right eye 10/07/2016  . Pseudophakia of left eye 10/07/2016  . Hypertension due to endocrine disorder 03/04/2016  . 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 Renal failure 03/04/2016  . Acute kidney injury ([place]) 12/10/2015  . Immunosuppression ([place]) 12/10/2015  . Nausea vomiting and diarrhea 12/10/2015  . 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 Essential hypertension, benign 12/10/2015  . ARF (acute renal failure) ([place]) 12/10/2015  . Pancytopenia ([place]) 12/10/2015  . Encounter  for screening colonoscopy 11/15/2014  . GERD (gastroesophageal reflux disease) 11/15/2014    Past Surgical History:  Procedure Laterality Date  . COLONOSCOPY N/A 12/05/2014   [id number] external and internal hemorrhoid/mild diverticulosis/11 polyps removed  . ESOPHAGOGASTRODUODENOSCOPY N/A 12/05/2014   JFH:LKTG duodenitis  . KIDNEY TRANSPLANT  03/2015    Prior to Admission medications   Medication Sig Start Date End Date Taking? Authorizing Provider  amLODipine (NORVASC) 5 MG tablet Take by mouth.    [provider]  atorvastatin (LIPITOR) 40 MG tablet Take by mouth.    [provider]  azaTHIOprine (IMURAN) 50 MG tablet  05/20/18   [provider]  Blood Glucose Monitoring Suppl (ACCU-CHEK GUIDE ME) w/Device KIT 1 Piece by Does not apply route as directed. 02/18/18   [name], MD  cloNIDine (CATAPRES - DOSED IN MG/24 HR) 0.3 mg/24hr patch Place 0.3 mg onto the skin once a week.    [provider]  cyclobenzaprine (FLEXERIL) 10 MG tablet Take 1 tablet (10 mg total) by mouth 2 (two) times daily as needed for muscle spasms. 12/27/18   Hedges, Dellis Filbert, PA-C  doxazosin (CARDURA) 4 MG tablet Take by mouth. 12/18/15   [provider]  doxycycline (VIBRA-TABS) 100 MG tablet Take 1 tablet (100 mg total) by mouth 2 (two) times daily. 08/30/18   Trula Slade, DPM  glipiZIDE (GLUCOTROL XL) 5 MG 24 hr tablet Take by mouth.    [provider]  glucose blood (ACCU-CHEK GUIDE) test strip Use as instructed 4 x daily. E11.65 02/19/18   [name], MD  hydrALAZINE (APRESOLINE) 25 MG tablet Take 25 mg by mouth 2 (  two) times daily.    [provider]  hydrOXYzine (ATARAX/VISTARIL) 25 MG tablet Take 25 mg by mouth 2 (two) times daily as needed.    [provider]  Insulin Glargine (LANTUS SOLOSTAR) 100 UNIT/ML Solostar Pen Inject 50 Units into the skin at bedtime. 10/11/18   [name], MD  Insulin Pen  Needle (B-D ULTRAFINE III SHORT PEN) 31G X 8 MM MISC 1 each by Does not apply route as directed. 02/18/18   [name], MD  Multiple Vitamin (MULTIVITAMIN) capsule Take 1 capsule by mouth daily.    [provider]  mupirocin ointment (BACTROBAN) 2 % Apply 1 application topically 2 (two) times daily. 03/01/18   Trula Slade, DPM  omeprazole (PRILOSEC) 20 MG capsule Take by mouth. 11/15/14   [provider]  oxyCODONE (ROXICODONE) 5 MG immediate release tablet Take 1 tablet (5 mg total) by mouth every 8 (eight) hours as needed for up to 12 doses. 12/27/18   Keliah Harned B, FNP  sodium bicarbonate 650 MG tablet Take 1,300 mg by mouth 3 (three) times daily. 11/02/18   [provider]  tacrolimus (PROGRAF) 1 MG capsule Take 2 mg by mouth 2 (two) times daily. 2 capsules every morning, 2 capsules in the evening    [provider]  Vitamin D, Ergocalciferol, (DRISDOL) 50000 units CAPS capsule Take 50,000 Units by mouth every 30 (thirty) days.    [provider]    Allergies Patient has no known allergies.  Family History  Problem Relation Age of Onset  . Diabetes Mother   . Colon cancer Neg Hx     Social History Social History   Tobacco Use  . Smoking status: Never Smoker  . Smokeless tobacco: Never Used  Substance Use Topics  . Alcohol use: No    Alcohol/week: 0.0 standard drinks  . Drug use: No    Review of Systems Constitutional: Negative for fever. Cardiovascular: Negative for chest pain. Respiratory: Negative for shortness of breath. Musculoskeletal: Positive for neck pain. Skin: Negative for open wounds or lesions Neurological: Negative for decrease in sensation  ____________________________________________   PHYSICAL EXAM:  VITAL SIGNS: ED Triage Vitals  Enc Vitals Group     BP 12/27/18 1642 (!) 141/76     Pulse Rate 12/27/18 1642 (!) 58     Resp 12/27/18 1642 16     Temp 12/27/18 1642 98.1 F (36.7 C)      Temp Source 12/27/18 1642 Oral     SpO2 12/27/18 1642 97 %     Weight 12/27/18 1641 226 lb (102.5 kg)     Height 12/27/18 1641 '5\' 7"'  (1.702 m)     Head Circumference --      Peak Flow --      Pain Score 12/27/18 1641 10     Pain Loc --      Pain Edu? --      Excl. in [place]? --     Constitutional: Alert and oriented. Well appearing and in no acute distress. Eyes: Conjunctivae are clear without discharge or drainage Head: Atraumatic Neck: Diffusely tender bilateral paracervical muscles.  No focal midline tenderness on exam. Respiratory: No cough. Respirations are even and unlabored. Musculoskeletal: See neck exam. Neurologic: No radiculopathy on exam. Skin: No open wounds or lesions noted on the skin overlying the paracervical muscles. Psychiatric: Affect and behavior are appropriate.  ____________________________________________   LABS (all labs ordered are listed, but only abnormal results are displayed)  Labs Reviewed -  No data to display ____________________________________________  RADIOLOGY  Image of the cervical spine shows no acute findings per radiology. ____________________________________________   PROCEDURES  Procedures  ____________________________________________   INITIAL IMPRESSION / ASSESSMENT AND PLAN / ED COURSE  Raymond [name] is a 58 y.o. who presents to the emergency department for treatment and evaluation of nontraumatic neck pain.  See HPI for further details.  While here, he was given 1 oxycodone 5 mg with significant improvement.  He will be given a prescription for the same.   Patient instructed to follow-up with orthopedics if not improving over the next few days.  He was also instructed to return to the emergency department for symptoms that change or worsen if unable schedule an appointment with orthopedics or primary care.  Medications  oxyCODONE (Oxy IR/ROXICODONE) immediate release tablet 5 mg (5 mg Oral Given 12/27/18 1847)    Pertinent  labs & imaging results that were available during my care of the patient were reviewed by me and considered in my medical decision making (see chart for details).  _________________________________________   FINAL CLINICAL IMPRESSION(S) / ED DIAGNOSES  Final diagnoses:  Torticollis, acute    ED Discharge Orders         Ordered    oxyCODONE (ROXICODONE) 5 MG immediate release tablet  Every 8 hours PRN     12/27/18 1922           If controlled substance prescribed during this visit, 12 month history viewed on the [place] prior to issuing an initial prescription for Schedule II or III opiod.   Victorino Dike, FNP 12/31/18 2256    Schuyler Amor, MD 12/31/18 [phone]

## 2019-01-24 ENCOUNTER — Ambulatory Visit (INDEPENDENT_AMBULATORY_CARE_PROVIDER_SITE_OTHER): Payer: BC Managed Care – PPO

## 2019-01-24 ENCOUNTER — Telehealth: Payer: Self-pay | Admitting: *Deleted

## 2019-01-24 ENCOUNTER — Telehealth: Payer: Self-pay | Admitting: "Endocrinology

## 2019-01-24 ENCOUNTER — Other Ambulatory Visit: Payer: Self-pay | Admitting: Podiatry

## 2019-01-24 ENCOUNTER — Telehealth: Payer: Self-pay | Admitting: Podiatry

## 2019-01-24 ENCOUNTER — Ambulatory Visit (INDEPENDENT_AMBULATORY_CARE_PROVIDER_SITE_OTHER): Payer: BC Managed Care – PPO | Admitting: Podiatry

## 2019-01-24 ENCOUNTER — Other Ambulatory Visit: Payer: Self-pay

## 2019-01-24 VITALS — Temp 98.0°F

## 2019-01-24 DIAGNOSIS — M79674 Pain in right toe(s): Secondary | ICD-10-CM

## 2019-01-24 DIAGNOSIS — M86172 Other acute osteomyelitis, left ankle and foot: Secondary | ICD-10-CM | POA: Diagnosis not present

## 2019-01-24 DIAGNOSIS — M79675 Pain in left toe(s): Secondary | ICD-10-CM

## 2019-01-24 DIAGNOSIS — L84 Corns and callosities: Secondary | ICD-10-CM

## 2019-01-24 DIAGNOSIS — M79672 Pain in left foot: Secondary | ICD-10-CM

## 2019-01-24 DIAGNOSIS — B351 Tinea unguium: Secondary | ICD-10-CM

## 2019-01-24 DIAGNOSIS — L97524 Non-pressure chronic ulcer of other part of left foot with necrosis of bone: Secondary | ICD-10-CM

## 2019-01-24 DIAGNOSIS — E0843 Diabetes mellitus due to underlying condition with diabetic autonomic (poly)neuropathy: Secondary | ICD-10-CM

## 2019-01-24 MED ORDER — DOXYCYCLINE HYCLATE 100 MG PO TABS: 100.0000 mg | ORAL_TABLET | Freq: Two times a day (BID) | ORAL | 0 refills | Status: DC

## 2019-01-24 MED ORDER — LANTUS SOLOSTAR 100 UNIT/ML ~~LOC~~ SOPN: 50.0000 [IU] | PEN_INJECTOR | Freq: Every day | SUBCUTANEOUS | 0 refills | Status: DC

## 2019-01-24 MED ORDER — MUPIROCIN 2 % EX OINT: 1.0000 "application " | TOPICAL_OINTMENT | Freq: Two times a day (BID) | CUTANEOUS | 2 refills | Status: DC

## 2019-01-24 NOTE — Telephone Encounter (Signed)
[facility] called for ICD.10 codes for the labs, pt is at the lab now.

## 2019-01-24 NOTE — Telephone Encounter (Signed)
Patient left a Vm for you to call him regarding some leg swelling?

## 2019-01-24 NOTE — Telephone Encounter (Signed)
Advised pt to contact PCP 

## 2019-01-24 NOTE — Progress Notes (Signed)
Subjective: 58-year-old male presents the office today for concerns of wounds to his feet.  The right side is the same as what last appointment he has new concern of a wound on his left big toe which started about 1 week ago.  Denies any pus from the area when he is not sure how this started.  He has noted some swelling.  Toenail started about 1 week ago.  Denies any drainage or pus and denies any red streaks.  He is also asked for his nails be trimmed today.  His last A1c was 12.4.  Since then he states he was started on insulin. Denies any systemic complaints such as fevers, chills, nausea, vomiting. No acute changes since last appointment, and no other complaints at this time.   Objective: AAO x3, NAD DP/PT pulses decreased bilaterally Ulceration of the distal aspect the left hallux with fibrotic plug.  Upon debridement the wound measures approximate 0.4 x 0.2 x 0.4 cm. The wound does not probe to bone although close.  There is edema to the toe the distal aspect but not extending past the MPJ.  Mild increase in warmth to the toe.  No ascending cellulitis.  No fluctuation crepitation. Hyperkeratotic lesion on the right foot submetatarsal area over the previous 1.  Upon debridement there is no ongoing ulceration or signs of infection. Nails are hypertrophic, dystrophic, brittle, discolored, elongated 10. No surrounding redness or drainage. Tenderness nails 1-5 bilaterally. No open lesions or pre-ulcerative lesions are identified today. No pain with calf compression, swelling, warmth, erythema  Assessment: Ulceration with concern for osteomyelitis left hallux; pre-ulcerative callus with symptomatic onychomycosis  Plan: -All treatment options discussed with the patient including all alternatives, risks, complications.  -X-rays obtained reviewed of the left foot.  There appears to be a small cortical change at the distal aspect the distal phalanx concerning for osteomyelitis. -I debrided the wound  today utilizing tissue nippers also 312 with scalpel.  Prescribed doxycycline.  Also prescribe mupirocin to apply daily.  Ordered CBC, ESR, CRP, A1c.  -He previously followed up with Dr. Berry in regards to circulation however we will order arterial duplex.  -Surgical shoe for offloading which she has at home -Discussed possible amputation of the toe. -Patient encouraged to call the office with any questions, concerns, change in symptoms.   Return in about 1 week (around 01/31/2019).  Repeat x-rays left foot, please raise hallux on lateral view  Matthew R Wagoner DPM   

## 2019-01-24 NOTE — Telephone Encounter (Signed)
Raiann with [employer] called saying the ICD-10 codes would not pay. Gave her the ICD-10 codes of M86.172 and M79.672 that Marcy Siren, RN gave me. Raiann stated these would not cover the A-1C hemoglobin and pt would be billed $75.00. She also stated pt is going to have is blood taken by the doctor who performed his kidney transplant.

## 2019-01-24 NOTE — Telephone Encounter (Signed)
1 month sent. Advised pt to sched appt

## 2019-01-24 NOTE — Telephone Encounter (Signed)
Pt requesting refill on Insulin Glargine (LANTUS SOLOSTAR) 100 UNIT/ML Solostar Pen. Patient no showed appt in May. Please advise

## 2019-01-24 NOTE — Telephone Encounter (Signed)
Faxed orders to [facility]. 

## 2019-01-24 NOTE — Telephone Encounter (Signed)
-----   Message from Trula Slade, DPM sent at 01/24/2019  9:29 AM EDT ----- Can you please order arterial duplex due to ulcer on left toe but can you order b/l? Thanks.

## 2019-01-25 ENCOUNTER — Other Ambulatory Visit: Payer: Self-pay | Admitting: Podiatry

## 2019-01-25 DIAGNOSIS — R0989 Other specified symptoms and signs involving the circulatory and respiratory systems: Secondary | ICD-10-CM

## 2019-01-25 DIAGNOSIS — I739 Peripheral vascular disease, unspecified: Secondary | ICD-10-CM

## 2019-01-25 DIAGNOSIS — M86172 Other acute osteomyelitis, left ankle and foot: Secondary | ICD-10-CM

## 2019-01-25 LAB — CBC WITH DIFFERENTIAL/PLATELET
Absolute Monocytes: 400 cells/uL (ref 200–950)
Basophils Absolute: 19 cells/uL (ref 0–200)
Basophils Relative: 0.5 %
Eosinophils Absolute: 118 cells/uL (ref 15–500)
Eosinophils Relative: 3.2 %
HCT: 44.2 % (ref 38.5–50.0)
Hemoglobin: 13.7 g/dL (ref 13.2–17.1)
Lymphs Abs: 1180 cells/uL (ref 850–3900)
MCH: 25.7 pg — ABNORMAL LOW (ref 27.0–33.0)
MCHC: 31 g/dL — ABNORMAL LOW (ref 32.0–36.0)
MCV: 82.9 fL (ref 80.0–100.0)
MPV: 13.2 fL — ABNORMAL HIGH (ref 7.5–12.5)
Monocytes Relative: 10.8 %
Neutro Abs: 1983 cells/uL (ref 1500–7800)
Neutrophils Relative %: 53.6 %
Platelets: 164 10*3/uL (ref 140–400)
RBC: 5.33 10*6/uL (ref 4.20–5.80)
RDW: 12.9 % (ref 11.0–15.0)
Total Lymphocyte: 31.9 %
WBC: 3.7 10*3/uL — ABNORMAL LOW (ref 3.8–10.8)

## 2019-01-25 LAB — SEDIMENTATION RATE: Sed Rate: 56 mm/h — ABNORMAL HIGH (ref 0–20)

## 2019-01-25 LAB — C-REACTIVE PROTEIN: CRP: 63.1 mg/L — ABNORMAL HIGH (ref <8.0)

## 2019-01-27 ENCOUNTER — Ambulatory Visit (HOSPITAL_COMMUNITY)
Admission: RE | Admit: 2019-01-27 | Discharge: 2019-01-27 | Disposition: A | Discharge status: Home / Self Care | Payer: BC Managed Care – PPO | Source: Ambulatory Visit | Attending: Cardiovascular Disease | Admitting: Cardiovascular Disease

## 2019-01-27 ENCOUNTER — Other Ambulatory Visit: Payer: Self-pay

## 2019-01-27 DIAGNOSIS — M86172 Other acute osteomyelitis, left ankle and foot: Secondary | ICD-10-CM | POA: Insufficient documentation

## 2019-01-27 DIAGNOSIS — I739 Peripheral vascular disease, unspecified: Secondary | ICD-10-CM | POA: Insufficient documentation

## 2019-01-27 DIAGNOSIS — M79672 Pain in left foot: Secondary | ICD-10-CM | POA: Diagnosis not present

## 2019-01-27 DIAGNOSIS — R0989 Other specified symptoms and signs involving the circulatory and respiratory systems: Secondary | ICD-10-CM | POA: Insufficient documentation

## 2019-01-28 ENCOUNTER — Telehealth: Payer: Self-pay | Admitting: *Deleted

## 2019-01-28 NOTE — Telephone Encounter (Signed)
I called Raymond [name] and informed of Dr. Leigh Aurora review of results and instructions to continue the antibiotics and reminded Raymond [name] of 02/01/2019 1:15pm appt. Raymond [name] states understanding.

## 2019-01-28 NOTE — Telephone Encounter (Signed)
-----   Message from Trula Slade, DPM sent at 01/26/2019  6:37 PM EDT ----- Val- please let him know the inflammatory markers are high. Continue current antibiotics. Will repeat x-ray next week. If there is any worsening of infection he needs to let me know or go to the ER. Thanks.

## 2019-01-29 ENCOUNTER — Emergency Department
Admission: EM | Admit: 2019-01-29 | Discharge: 2019-01-31 | Disposition: A | Discharge status: Home / Self Care | Payer: BC Managed Care – PPO | Attending: Emergency Medicine | Admitting: Emergency Medicine

## 2019-01-29 ENCOUNTER — Encounter: Payer: Self-pay | Admitting: Emergency Medicine

## 2019-01-29 ENCOUNTER — Other Ambulatory Visit: Payer: Self-pay

## 2019-01-29 DIAGNOSIS — Z5321 Procedure and treatment not carried out due to patient leaving prior to being seen by health care provider: Secondary | ICD-10-CM | POA: Insufficient documentation

## 2019-01-29 DIAGNOSIS — R2242 Localized swelling, mass and lump, left lower limb: Secondary | ICD-10-CM | POA: Insufficient documentation

## 2019-01-29 LAB — CBC
HCT: 41.5 % (ref 39.0–52.0)
Hemoglobin: 12.8 g/dL — ABNORMAL LOW (ref 13.0–17.0)
MCH: 25.7 pg — ABNORMAL LOW (ref 26.0–34.0)
MCHC: 30.8 g/dL (ref 30.0–36.0)
MCV: 83.3 fL (ref 80.0–100.0)
Platelets: 167 10*3/uL (ref 150–400)
RBC: 4.98 MIL/uL (ref 4.22–5.81)
RDW: 13.4 % (ref 11.5–15.5)
WBC: 4.1 10*3/uL (ref 4.0–10.5)
nRBC: 0 % (ref 0.0–0.2)

## 2019-01-29 LAB — BASIC METABOLIC PANEL
Anion gap: 8 (ref 5–15)
BUN: 31 mg/dL — ABNORMAL HIGH (ref 6–20)
CO2: 27 mmol/L (ref 22–32)
Calcium: 9.5 mg/dL (ref 8.9–10.3)
Chloride: 102 mmol/L (ref 98–111)
Creatinine, Ser: 1.4 mg/dL — ABNORMAL HIGH (ref 0.61–1.24)
GFR calc Af Amer: >60 mL/min (ref >60)
GFR calc non Af Amer: 55 mL/min — ABNORMAL LOW (ref >60)
Glucose, Bld: 325 mg/dL — ABNORMAL HIGH (ref 70–99)
Potassium: 4.6 mmol/L (ref 3.5–5.1)
Sodium: 137 mmol/L (ref 135–145)

## 2019-01-29 NOTE — ED Triage Notes (Signed)
Pt presents ambulatory to triage room reports left leg swelling about 2 weeks ago. Pt is kidney transplant pt, report mild discomfort when walking denies any other symptom at present.

## 2019-02-01 ENCOUNTER — Ambulatory Visit (INDEPENDENT_AMBULATORY_CARE_PROVIDER_SITE_OTHER): Payer: BC Managed Care – PPO

## 2019-02-01 ENCOUNTER — Other Ambulatory Visit: Payer: Self-pay

## 2019-02-01 ENCOUNTER — Telehealth: Payer: Self-pay | Admitting: Cardiovascular Disease

## 2019-02-01 ENCOUNTER — Telehealth: Payer: Self-pay | Admitting: *Deleted

## 2019-02-01 ENCOUNTER — Ambulatory Visit (INDEPENDENT_AMBULATORY_CARE_PROVIDER_SITE_OTHER): Payer: BC Managed Care – PPO | Admitting: Podiatry

## 2019-02-01 DIAGNOSIS — L97524 Non-pressure chronic ulcer of other part of left foot with necrosis of bone: Secondary | ICD-10-CM | POA: Diagnosis not present

## 2019-02-01 DIAGNOSIS — M86172 Other acute osteomyelitis, left ankle and foot: Secondary | ICD-10-CM | POA: Diagnosis not present

## 2019-02-01 MED ORDER — DOXYCYCLINE HYCLATE 100 MG PO TABS: 100.0000 mg | ORAL_TABLET | Freq: Two times a day (BID) | ORAL | 0 refills | Status: DC

## 2019-02-01 NOTE — Progress Notes (Signed)
Subjective: 58 year old male presents the office today for follow-up evaluation of wound on his left big toe. Per the records he went to the emergency room for worsening left leg swelling but he states they treated the shoulder as that has been hurting.  He states that he feels well he denies any fevers, chills, nausea, vomiting.  Swelling improved to the leg.  He has not had any drainage from the wound. He did have his circulation test and blood work done and presents to discuss results.    Last A1c 12/4  Objective: AAO x3, NAD DP/PT pulses decreased bilaterally Ulceration of the distal aspect the left hallux with fibrotic plug.  Upon debridement the wound measures approximate 0.4 x 0.2 x 0.4 cm. There is edema to the hallux and minimally to the foot. There is no fluctuance, crepitance, drainage, pus. No malodor. The foot is equal temperature compared to the contralateral extremity.  No pain with calf compression, swelling, warmth, erythema  Assessment: Ulceration with  osteomyelitis left hallux  Plan: -All treatment options discussed with the patient including all alternatives, risks, complications.  -X-rays obtained reviewed of the left foot.  There are cortical changes of the distal phalanx consistent with osteomyelitis. -I reviewed the x-rays as well as the relevant arterial studies.  I do think he needs to have amputation performed of the toe. However given circulation I would like him evaluated by Dr. Gwenlyn Found prior to this. If unable to see him I will go ahead and proceed with the toe amputation in the near future.  -Refilled doxycycline -Wear surgical shoe- he is wearing a regular shoe today -Monitor for any clinical signs or symptoms of infection and directed to call the office immediately should any occur or go to the ER.   Trula Slade DPM

## 2019-02-01 NOTE — Telephone Encounter (Signed)
New message  Per Mateo Flow at [facility] need to have left great toe amputated.Wants to know if the 09/22 appt with Dr. Gwenlyn Found can be moved up to this week. Please call to discuss.

## 2019-02-01 NOTE — Telephone Encounter (Signed)
I spoke with Altha Harm - [facility] she states she will have nursing call with update appt time.

## 2019-02-01 NOTE — Telephone Encounter (Signed)
Left message  For Raymond [name] to  Call back-  Can see patient  At 4:14 pm 02/02/19

## 2019-02-01 NOTE — Telephone Encounter (Signed)
Thanks

## 2019-02-01 NOTE — Telephone Encounter (Signed)
[facility] - [facility] states Dr. Gwenlyn Found can see him tomorrow at 4:15pm and have pt arrive at 3:45pm for possible paperwork or possibility of getting in earlier.

## 2019-02-01 NOTE — Telephone Encounter (Signed)
I called pt and informed of the 02/02/2019 3:45pm arrival for 4:15pm appt and pt stated he would be there.

## 2019-02-01 NOTE — Telephone Encounter (Signed)
Spoke to [place] at [facility]- patient is aware of appointment will be moved up to  3:45 pm  tomorrow

## 2019-02-02 ENCOUNTER — Telehealth: Payer: Self-pay

## 2019-02-02 ENCOUNTER — Ambulatory Visit (INDEPENDENT_AMBULATORY_CARE_PROVIDER_SITE_OTHER): Payer: BC Managed Care – PPO | Admitting: Cardiovascular Disease

## 2019-02-02 ENCOUNTER — Encounter: Payer: Self-pay | Admitting: Cardiovascular Disease

## 2019-02-02 ENCOUNTER — Telehealth: Payer: Self-pay | Admitting: *Deleted

## 2019-02-02 VITALS — BP 130/90 | HR 57 | Ht 69.0 in | Wt 228.6 lb

## 2019-02-02 DIAGNOSIS — Z008 Encounter for other general examination: Secondary | ICD-10-CM | POA: Diagnosis not present

## 2019-02-02 DIAGNOSIS — I998 Other disorder of circulatory system: Secondary | ICD-10-CM

## 2019-02-02 DIAGNOSIS — M79605 Pain in left leg: Secondary | ICD-10-CM

## 2019-02-02 DIAGNOSIS — I70229 Atherosclerosis of native arteries of extremities with rest pain, unspecified extremity: Secondary | ICD-10-CM | POA: Insufficient documentation

## 2019-02-02 DIAGNOSIS — R609 Edema, unspecified: Secondary | ICD-10-CM

## 2019-02-02 NOTE — Telephone Encounter (Signed)
I called [facility] - Kim-scheduler states would need orders for venous doppler to be scheduled, but may not be able to perform today, but at my request she put statement in note to Dr. Gwenlyn Found to evaluate swelling in left leg for DVT. Faxed orders to [facility].

## 2019-02-02 NOTE — Assessment & Plan Note (Signed)
Mr. Hillman was sent back to me by Dr. Jacqualyn Posey for reevaluation of a new wound on his left foot.  I last saw him in the office 3 months ago (07/27/2018) for a wound on his right foot.  That wound ultimately healed.  He has a new wound on his left left second toe which is slowly healing.  Doppler studies performed 01/27/2019 revealed noncompressible vessels with a left TBI of 0.28, monophasic SFA waveforms with what appears to be an occluded anterior tibial and peroneal artery typical of diabetic peripheral arterial disease.  Unfortunately, his serum creatinine is 1.4 and he has had renal transplant in [facility] 03/08/2016.  I am concerned that angiography would lead to contrast nephropathy and jeopardize his transplanted kidney.  I have spoken to Dr. Carman Ching about this.  We will forward documentation to his nephrologist as well.

## 2019-02-02 NOTE — Patient Instructions (Addendum)
Medication Instructions:  Your physician recommends that you continue on your current medications as directed. Please refer to the Current Medication list given to you today.  If you need a refill on your cardiac medications before your next appointment, please call your pharmacy.   Lab work: none If you have labs (blood work) drawn today and your tests are completely normal, you will receive your results only by: [name] MyChart Message (if you have MyChart) OR . A paper copy in the mail If you have any lab test that is abnormal or we need to change your treatment, we will call you to review the results.  Testing/Procedures: none  Follow-Up: At [facility], you and your health needs are our priority.  As part of our continuing mission to provide you with exceptional heart care, we have created designated Provider Care Teams.  These Care Teams include your primary Cardiologist (physician) and Advanced Practice Providers (APPs -  Physician Assistants and Nurse Practitioners) who all work together to provide you with the care you need, when you need it. You will need a follow up appointment in 6 weeks with Dr. Quay Burow.   Any Other Special Instructions Will Be Listed Below (If Applicable). PLEASE COMPLETE AND RETURN THE [facility] REQUEST AND AUTHORIZATION FOR USE/DISCLOSURE OF PROTECTED HEALTH INFORMATION FORM THAT WAS MAILED TO YOU SO THAT WE MAY SEND YOUR RECORDS TO THE OFFICE OF YOUR REQUEST.

## 2019-02-02 NOTE — Telephone Encounter (Signed)
Ordered in another Telephone Call.

## 2019-02-02 NOTE — Telephone Encounter (Signed)
Left message for [facility] to assist with getting pt in for venous doppler today at office visit with Dr. Gwenlyn Found.

## 2019-02-02 NOTE — Telephone Encounter (Signed)
[facility] - Raymond [name] states she would like to discuss pt's current wound care and the blood clot in his lower extremity.

## 2019-02-02 NOTE — Telephone Encounter (Signed)
-----   Message from Trula Slade, DPM sent at 02/02/2019 12:34 PM EDT ----- In regards to his leg swelling I did not appreciate significant swelling yesterday and no pain to the leg. Can we see if they can do a venous duplex when he goes today to see Dr. Gwenlyn Found?

## 2019-02-02 NOTE — Telephone Encounter (Signed)
9/9 AVS mailed to pt on 9/9 along with a [facility] INFORMATION form with advisory in AVS to complete form and return to [facility] office. Pt requested during OV today that records be sent to his nephrologist Dr. Chapman Fitch? Pt unsure of name of provider. Pt provided the following office number: [phone] which is for [facility]  Contact Info: [address], [address] [place], [place] [address] [phone] - phone [phone] - fax

## 2019-02-02 NOTE — Telephone Encounter (Signed)
I spoke with [facility] and she states pt was seen today, but did not know the medication he was prescribed and he knows he had testing on the left leg, and they would like a copy of the results since the left leg is swollen, and she is aware pt has an appt with cardiovascular today. I told Alyse Low our last testing was 01/27/2019 ABI with TBI, arterial dopplers and last venous doppler 02/2018. Christy request results to be faxed to [phone]. Faxed 02/2018 venous doppler and doppler results of 01/27/2019 to [facility] Attn:  Alyse Low.

## 2019-02-02 NOTE — Progress Notes (Signed)
02/02/2019 Raymond [name]   1961/03/29  [id number]  Primary Physician System, Pcp Not In Primary Cardiologist: Lorretta Harp MD FACP, [place], [place], [place]  HPI:  Raymond [name] is a 58 y.o.  moderately overweight separated African-American male father of 2 children, grandfather of 2 grandchildren who I last saw in the office 07/27/2018. He i was referred by Dr. Jacqualyn Posey, his podiatrist for peripheral vascular valuation because of a small ulcer on the plantar surface of his right first metatarsal.  He has a history of treated hypertension, diabetes and hyperlipidemia.  His father had an MI at age 80 and his brother recently had a myocardial infarction as well.  He is never had a heart attack or stroke.  He denies chest pain or shortness of breath.  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  He apparently stepped on a nail approximately 2 to 3 months ago and developed a wound on the plantar surface of his right first metatarsal which Dr. Jacqualyn Posey has been treating fairly frequently over the last several months.  Apparently this is slowly healing.  He had Dopplers performed in [place] recently that were told showed normal circulation.  Since I saw him 6 months ago the wound on his right foot is healed.  He is developed a new wound on his left foot with recent Dopplers that showed a left TBI 0.28, monophasic waveforms in his left SFA with tibial vessel disease, occluded anterior tibial and peroneal artery.  I suspect he will need a amputation of that toe since he has documented osteomyelitis according to Dr. Jacqualyn Posey.  Is unclear whether this will heal but I suspect based on his Dopplers that this will be difficult.  I am also concerned that angiography will lead to radiocontrast nephropathy and jeopardize his transplanted kidney.   Current Meds  Medication Sig  . amLODipine (NORVASC) 5 MG  tablet Take by mouth.  [name] atorvastatin (LIPITOR) 40 MG tablet Take by mouth.  . azaTHIOprine (IMURAN) 50 MG tablet   . Blood Glucose Monitoring Suppl (ACCU-CHEK GUIDE ME) w/Device KIT 1 Piece by Does not apply route as directed.  . cloNIDine (CATAPRES - DOSED IN MG/24 HR) 0.3 mg/24hr patch Place 0.3 mg onto the skin once a week.  . cyclobenzaprine (FLEXERIL) 10 MG tablet Take 1 tablet (10 mg total) by mouth 2 (two) times daily as needed for muscle spasms.  [name] doxazosin (CARDURA) 4 MG tablet Take by mouth.  . doxycycline (VIBRA-TABS) 100 MG tablet Take 1 tablet (100 mg total) by mouth 2 (two) times daily.  [name] glipiZIDE (GLUCOTROL XL) 5 MG 24 hr tablet Take by mouth.  [name] glucose blood (ACCU-CHEK GUIDE) test strip Use as instructed 4 x daily. E11.65  . hydrALAZINE (APRESOLINE) 25 MG tablet Take 25 mg by mouth 2 (two) times daily.  [name] HYDROcodone-acetaminophen (NORCO/VICODIN) 5-325 MG tablet TAKE 1 TABLET BY MOUTH EVERY 4 HOURS AS NEEDED FOR SHOULDER PAIN  . hydrOXYzine (ATARAX/VISTARIL) 25 MG tablet Take 25 mg by mouth 2 (two) times daily as needed.  . Insulin Glargine (LANTUS SOLOSTAR) 100 UNIT/ML Solostar Pen Inject 50 Units into the skin at bedtime.  . Insulin Pen Needle (B-D ULTRAFINE III SHORT PEN) 31G X 8 MM MISC 1 each by Does not apply route as directed.  . Multiple Vitamin (MULTIVITAMIN) capsule Take 1 capsule by mouth daily.  . mupirocin ointment (  BACTROBAN) 2 % Apply 1 application topically 2 (two) times daily.  [name] omeprazole (PRILOSEC) 20 MG capsule Take by mouth.  . oxyCODONE (ROXICODONE) 5 MG immediate release tablet Take 1 tablet (5 mg total) by mouth every 8 (eight) hours as needed for up to 12 doses.  . sodium bicarbonate 650 MG tablet Take 1,300 mg by mouth 3 (three) times daily.  . tacrolimus (PROGRAF) 1 MG capsule Take 2 mg by mouth 2 (two) times daily. 2 capsules every morning, 2 capsules in the evening  . Vitamin D, Ergocalciferol, (DRISDOL) 50000 units CAPS capsule Take 50,000  Units by mouth every 30 (thirty) days.     No Known Allergies  Social History   Socioeconomic History  . Marital status: Married    Spouse name: Not on file  . Number of children: Not on file  . Years of education: Not on file  . Highest education level: Not on file  Occupational History  . Not on file  Social Needs  . Financial resource strain: Not on file  . Food insecurity    Worry: Not on file    Inability: Not on file  . Transportation needs    Medical: Not on file    Non-medical: Not on file  Tobacco Use  . Smoking status: Never Smoker  . Smokeless tobacco: Never Used  Substance and Sexual Activity  . Alcohol use: No    Alcohol/week: 0.0 standard drinks  . Drug use: No  . Sexual activity: Not on file  Lifestyle  . Physical activity    Days per week: Not on file    Minutes per session: Not on file  . Stress: Not on file  Relationships  . Social Herbalist on phone: Not on file    Gets together: Not on file    Attends religious service: Not on file    Active member of club or organization: Not on file    Attends meetings of clubs or organizations: Not on file    Relationship status: Not on file  . Intimate partner violence    Fear of current or ex partner: Not on file    Emotionally abused: Not on file    Physically abused: Not on file    Forced sexual activity: Not on file  Other Topics Concern  . Not on file  Social History Narrative   ** Merged History Encounter **         Review of Systems: General: negative for chills, fever, night sweats or weight changes.  Cardiovascular: negative for chest pain, dyspnea on exertion, edema, orthopnea, palpitations, paroxysmal nocturnal dyspnea or shortness of breath Dermatological: negative for rash Respiratory: negative for cough or wheezing Urologic: negative for hematuria Abdominal: negative for nausea, vomiting, diarrhea, bright red blood per rectum, melena, or hematemesis Neurologic: negative  for visual changes, syncope, or dizziness All other systems reviewed and are otherwise negative except as noted above.    Blood pressure 130/90, pulse (!) 57, height '5\' 9"'  (1.753 m), weight 228 lb 9.6 oz (103.7 kg).  General appearance: alert and no distress Neck: no adenopathy, no carotid bruit, no JVD, supple, symmetrical, trachea midline and thyroid not enlarged, symmetric, no tenderness/mass/nodules Lungs: clear to auscultation bilaterally Heart: regular rate and rhythm, S1, S2 normal, no murmur, click, rub or gallop Extremities: extremities normal, atraumatic, no cyanosis or edema Pulses: Absent pedal pulses Skin: Ulcer tip of left second toe Neurologic: Alert and oriented X 3, normal strength and tone.  Normal symmetric reflexes. Normal coordination and gait  EKG not performed today  ASSESSMENT AND PLAN:   Critical lower limb ischemia Mr. Norcia was sent back to me by Dr. Jacqualyn Posey for reevaluation of a new wound on his left foot.  I last saw him in the office 3 months ago (07/27/2018) for a wound on his right foot.  That wound ultimately healed.  He has a new wound on his left left second toe which is slowly healing.  Doppler studies performed 01/27/2019 revealed noncompressible vessels with a left TBI of 0.28, monophasic SFA waveforms with what appears to be an occluded anterior tibial and peroneal artery typical of diabetic peripheral arterial disease.  Unfortunately, his serum creatinine is 1.4 and he has had renal transplant in [facility] 03/08/2016.  I am concerned that angiography would lead to contrast nephropathy and jeopardize his transplanted kidney.  I have spoken to Dr. Carman Ching about this.  We will forward documentation to his nephrologist as well.      Lorretta Harp MD FACP,FACC,FAHA, [facility] 02/02/2019 5:37 PM

## 2019-02-03 ENCOUNTER — Telehealth: Payer: Self-pay | Admitting: Cardiovascular Disease

## 2019-02-03 NOTE — Telephone Encounter (Signed)
Spoke to [place] PA. , patient is there today for an appointment.  ( [facility] [place])  Nephrologist rquesting  Office note and lower extremity dopplers and ABI report.   Faxed information to [phone]

## 2019-02-03 NOTE — Telephone Encounter (Signed)
Per call from [facility] nephrology calling have concerns need to speak to Triage.

## 2019-02-04 ENCOUNTER — Ambulatory Visit (HOSPITAL_BASED_OUTPATIENT_CLINIC_OR_DEPARTMENT_OTHER)
Admission: RE | Admit: 2019-02-04 | Discharge: 2019-02-04 | Disposition: A | Discharge status: Home / Self Care | Payer: BC Managed Care – PPO | Source: Ambulatory Visit | Attending: Cardiology | Admitting: Cardiology

## 2019-02-04 ENCOUNTER — Encounter (HOSPITAL_COMMUNITY): Payer: Self-pay | Admitting: Radiology

## 2019-02-04 ENCOUNTER — Encounter (HOSPITAL_COMMUNITY): Payer: Self-pay | Admitting: *Deleted

## 2019-02-04 ENCOUNTER — Ambulatory Visit (INDEPENDENT_AMBULATORY_CARE_PROVIDER_SITE_OTHER): Payer: BC Managed Care – PPO | Admitting: Podiatry

## 2019-02-04 ENCOUNTER — Other Ambulatory Visit: Payer: Self-pay

## 2019-02-04 ENCOUNTER — Emergency Department (HOSPITAL_COMMUNITY)
Admission: EM | Admit: 2019-02-04 | Discharge: 2019-02-05 | Discharge status: Against Medical Advice | Payer: BC Managed Care – PPO | Attending: Emergency Medicine | Admitting: Emergency Medicine

## 2019-02-04 DIAGNOSIS — I739 Peripheral vascular disease, unspecified: Secondary | ICD-10-CM | POA: Diagnosis not present

## 2019-02-04 DIAGNOSIS — Z5321 Procedure and treatment not carried out due to patient leaving prior to being seen by health care provider: Secondary | ICD-10-CM | POA: Insufficient documentation

## 2019-02-04 DIAGNOSIS — I82402 Acute embolism and thrombosis of unspecified deep veins of left lower extremity: Secondary | ICD-10-CM

## 2019-02-04 DIAGNOSIS — R609 Edema, unspecified: Secondary | ICD-10-CM | POA: Insufficient documentation

## 2019-02-04 DIAGNOSIS — M79605 Pain in left leg: Secondary | ICD-10-CM | POA: Diagnosis not present

## 2019-02-04 NOTE — ED Notes (Signed)
Pt states he is not hurting anymore so he will come back tomorrow

## 2019-02-04 NOTE — Progress Notes (Signed)
Received a call from cardiology that he had a positive DVT.  He was directed to the emergency room.  However the patient came to the office to discuss his treatment plan.  We had a long discussion regards to the infection in his foot.  Given osteomyelitis I do recommend amputation but he is adamant that he wants to try to save the toe.  I looked at the toe today and appear to be stable.  There was no purulence identified but still probing to bone.  There is edema to the toe and mild hyperpigmentation of the toe from swellig.  Ultimately he has poor circulation and tibial disease.  He had followed up with Dr. Gwenlyn Found and given his history of kidney transplant follow-up with angiogram.  Apparently the patient saw his nephrologist in [facility] yesterday and he was told that his creatinine was stable that he did not have the angiogram.  I discussed this with Dr. Alvester Chou as well we will obtain records from his nephrologist in [place].  Given the acute DVT patient still directed to the emergency room and his wife is present and he verbalized understanding.  He is into the [facility] emergency department.  I called the ER to inform them of his arrival.   I spent 30 minutes with this patient coordinating care and more than 50% of this was face to face.   Trula Slade DPM

## 2019-02-04 NOTE — ED Triage Notes (Signed)
The pt is here with a blood clot in his lt leg for 2-3 weeks he was  Sent here today for a blood thinner

## 2019-02-04 NOTE — Progress Notes (Unsigned)
Left lower venous has been completed and is positive for DVT.  Preliminary results can be found under CV proc through chart review.   Sharlett Iles, RVT [facility]

## 2019-02-05 ENCOUNTER — Other Ambulatory Visit: Payer: Self-pay

## 2019-02-05 ENCOUNTER — Emergency Department
Admission: EM | Admit: 2019-02-05 | Discharge: 2019-02-05 | Disposition: A | Discharge status: Home / Self Care | Payer: BC Managed Care – PPO | Attending: Emergency Medicine | Admitting: Emergency Medicine

## 2019-02-05 ENCOUNTER — Encounter: Payer: Self-pay | Admitting: Emergency Medicine

## 2019-02-05 DIAGNOSIS — E119 Type 2 diabetes mellitus without complications: Secondary | ICD-10-CM | POA: Diagnosis not present

## 2019-02-05 DIAGNOSIS — Z79899 Other long term (current) drug therapy: Secondary | ICD-10-CM | POA: Diagnosis not present

## 2019-02-05 DIAGNOSIS — I82452 Acute embolism and thrombosis of left peroneal vein: Secondary | ICD-10-CM | POA: Diagnosis present

## 2019-02-05 DIAGNOSIS — Z7901 Long term (current) use of anticoagulants: Secondary | ICD-10-CM | POA: Diagnosis not present

## 2019-02-05 DIAGNOSIS — Z794 Long term (current) use of insulin: Secondary | ICD-10-CM | POA: Insufficient documentation

## 2019-02-05 DIAGNOSIS — N189 Chronic kidney disease, unspecified: Secondary | ICD-10-CM | POA: Diagnosis not present

## 2019-02-05 DIAGNOSIS — I129 Hypertensive chronic kidney disease with stage 1 through stage 4 chronic kidney disease, or unspecified chronic kidney disease: Secondary | ICD-10-CM | POA: Diagnosis not present

## 2019-02-05 DIAGNOSIS — I824Z2 Acute embolism and thrombosis of unspecified deep veins of left distal lower extremity: Secondary | ICD-10-CM

## 2019-02-05 MED ORDER — RIVAROXABAN 20 MG PO TABS: 20.0000 mg | ORAL_TABLET | Freq: Every day | ORAL | 0 refills | Status: DC

## 2019-02-05 MED ORDER — RIVAROXABAN 15 MG PO TABS: 15.0000 mg | ORAL_TABLET | Freq: Two times a day (BID) | ORAL | 0 refills | Status: DC

## 2019-02-05 NOTE — ED Triage Notes (Addendum)
Pt was seen by a podiatrist yesterday in [place] regarding a toe problem-he has an infection to bone in left great toe that won't heal; pt is diabetic and the toe will need to be amputated; due to known reduced blood flow to foot, pt had an [place] yesterday to his left lower leg to assess the issue; a DVT to "Acute nonocclusive thrombus in the mid peroneal vein"; pt was directed yesterday to report to the ED; pt says he spent over 7 hours in the [place] at [facility] and decided not to wait any longer; so pt presents tonight to "be started on blood thinners"; pt reports very little to no pain; rates pain "1.5"/10

## 2019-02-05 NOTE — Discharge Instructions (Signed)
Take Xarelto 15 mg tablets twice daily with food for the first 21 days. Starting on day 22, start 20 mg tablet once daily with food.

## 2019-02-05 NOTE — ED Provider Notes (Signed)
[facility] Emergency Department Provider Note  ____________________________________________  Time seen: Approximately 9:44 PM  I have reviewed the triage vital signs and the nursing notes.   HISTORY  Chief Complaint DVT    HPI Raymond [name] is a 58 y.o. male presents to the emergency department as he has recently been diagnosed with a DVT and needs to be started on a blood thinner.  Patient was referred to the emergency department from his podiatrist.  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        Past Medical History:  Diagnosis Date  . Chronic kidney disease    Awaiting transplant  . Diabetes ([place])   . Diabetes mellitus without complication ([place])   . Hypertension   . Renal disorder     Patient Active Problem List   Diagnosis Date Noted  . Critical lower limb ischemia 02/02/2019  . Personal history of noncompliance with medical treatment, presenting hazards to health 10/07/2018  . Critical limb ischemia with history of revascularization of same extremity 07/27/2018  . Mixed hyperlipidemia 02/19/2018  . Class 1 obesity due to excess calories with serious comorbidity and body mass index (BMI) of 31.0 to 31.9 in adult 02/19/2018  . History of renal transplant 02/19/2018  . Bilateral impacted cerumen 08/31/2017  . Conductive hearing loss, bilateral 08/31/2017  . Nuclear sclerotic cataract of right eye 10/07/2016  . Pseudophakia of left eye 10/07/2016  . Hypertension due to endocrine disorder 03/04/2016  . 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 Renal failure 03/04/2016  . Acute kidney injury ([place]) 12/10/2015  . Immunosuppression ([place]) 12/10/2015  . Nausea vomiting and diarrhea 12/10/2015  . 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) ([place]) 12/10/2015  . Essential hypertension, benign 12/10/2015  .  ARF (acute renal failure) ([place]) 12/10/2015  . Pancytopenia ([place]) 12/10/2015  . Encounter for screening colonoscopy 11/15/2014  . GERD (gastroesophageal reflux disease) 11/15/2014    Past Surgical History:  Procedure Laterality Date  . COLONOSCOPY N/A 12/05/2014   [id number] external and internal hemorrhoid/mild diverticulosis/11 polyps removed  . ESOPHAGOGASTRODUODENOSCOPY N/A 12/05/2014   XTK:WIOX duodenitis  . KIDNEY TRANSPLANT  03/2015    Prior to Admission medications   Medication Sig Start Date End Date Taking? Authorizing Provider  amLODipine (NORVASC) 5 MG tablet Take by mouth.    [provider]  atorvastatin (LIPITOR) 40 MG tablet Take by mouth.    [provider]  azaTHIOprine (IMURAN) 50 MG tablet  05/20/18   [provider]  Blood Glucose Monitoring Suppl (ACCU-CHEK GUIDE ME) w/Device KIT 1 Piece by Does not apply route as directed. 02/18/18   [name], MD  cloNIDine (CATAPRES - DOSED IN MG/24 HR) 0.3 mg/24hr patch Place 0.3 mg onto the skin once a week.    [provider]  cyclobenzaprine (FLEXERIL) 10 MG tablet Take 1 tablet (10 mg total) by mouth 2 (two) times daily as needed for muscle spasms. 12/27/18   Hedges, Dellis Filbert, PA-C  doxazosin (CARDURA) 4 MG tablet Take by mouth. 12/18/15   [provider]  doxycycline (VIBRA-TABS) 100 MG tablet Take 1 tablet (100 mg total) by mouth 2 (two) times daily. 02/01/19   Trula Slade, DPM  glipiZIDE (GLUCOTROL XL) 5 MG 24 hr tablet Take by mouth.    [provider]  glucose blood (ACCU-CHEK GUIDE) test strip Use as instructed 4 x daily.  E11.65 02/19/18   [name], MD  hydrALAZINE (APRESOLINE) 25 MG tablet Take 25 mg by mouth 2 (two) times daily.    [provider]  HYDROcodone-acetaminophen (NORCO/VICODIN) 5-325 MG tablet TAKE 1 TABLET BY MOUTH EVERY 4 HOURS AS NEEDED FOR SHOULDER PAIN 01/30/19   [provider]  hydrOXYzine (ATARAX/VISTARIL)  25 MG tablet Take 25 mg by mouth 2 (two) times daily as needed.    [provider]  Insulin Glargine (LANTUS SOLOSTAR) 100 UNIT/ML Solostar Pen Inject 50 Units into the skin at bedtime. 01/24/19   [name], MD  Insulin Pen Needle (B-D ULTRAFINE III SHORT PEN) 31G X 8 MM MISC 1 each by Does not apply route as directed. 02/18/18   [name], MD  Multiple Vitamin (MULTIVITAMIN) capsule Take 1 capsule by mouth daily.    [provider]  mupirocin ointment (BACTROBAN) 2 % Apply 1 application topically 2 (two) times daily. 01/24/19   Trula Slade, DPM  omeprazole (PRILOSEC) 20 MG capsule Take by mouth. 11/15/14   [provider]  oxyCODONE (ROXICODONE) 5 MG immediate release tablet Take 1 tablet (5 mg total) by mouth every 8 (eight) hours as needed for up to 12 doses. 12/27/18   Triplett, Dessa Phi, FNP  Rivaroxaban (XARELTO) 15 MG TABS tablet Take 1 tablet (15 mg total) by mouth 2 (two) times daily with a meal for 21 days. 02/05/19 02/26/19  Lannie Fields, PA-C  rivaroxaban (XARELTO) 20 MG TABS tablet Take 1 tablet (20 mg total) by mouth daily with supper. 02/05/19 03/07/19  Lannie Fields, PA-C  sodium bicarbonate 650 MG tablet Take 1,300 mg by mouth 3 (three) times daily. 11/02/18   [provider]  tacrolimus (PROGRAF) 1 MG capsule Take 2 mg by mouth 2 (two) times daily. 2 capsules every morning, 2 capsules in the evening    [provider]  Vitamin D, Ergocalciferol, (DRISDOL) 50000 units CAPS capsule Take 50,000 Units by mouth every 30 (thirty) days.    [provider]    Allergies Patient has no known allergies.  Family History  Problem Relation Age of Onset  . Diabetes Mother   . Colon cancer Neg Hx     Social History Social History   Tobacco Use  . Smoking status: Never Smoker  . Smokeless tobacco: Never Used  Substance Use Topics  . Alcohol use: No    Alcohol/week: 0.0 standard drinks  . Drug use: No      Review of Systems  Constitutional: No fever/chills Eyes: No visual changes. No discharge ENT: No upper respiratory complaints. Cardiovascular: no chest pain. Respiratory: no cough. No SOB. Gastrointestinal: No abdominal pain.  No nausea, no vomiting.  No diarrhea.  No constipation. Genitourinary: Negative for dysuria. No hematuria Musculoskeletal: Negative for musculoskeletal pain. Skin: Negative for rash, abrasions, lacerations, ecchymosis. Neurological: Negative for headaches, focal weakness or numbness.   ____________________________________________   PHYSICAL EXAM:  VITAL SIGNS: ED Triage Vitals  Enc Vitals Group     BP 02/05/19 2111 (!) 156/79     Pulse Rate 02/05/19 2111 69     Resp 02/05/19 2111 18     Temp 02/05/19 2111 98.6 F (37 C)     Temp Source 02/05/19 2111 Oral     SpO2 02/05/19 2111 97 %     Weight 02/05/19 2114 228 lb (103.4 kg)     Height 02/05/19 2114 '5\' 9"'  (1.753 m)     Head Circumference --  Peak Flow --      Pain Score 02/05/19 2113 1     Pain Loc --      Pain Edu? --      Excl. in [place]? --      Constitutional: Alert and oriented. Well appearing and in no acute distress. Eyes: Conjunctivae are normal. PERRL. EOMI. Head: Atraumatic. Cardiovascular: Normal rate, regular rhythm. Normal S1 and S2.  Good peripheral circulation. Respiratory: Normal respiratory effort without tachypnea or retractions. Lungs CTAB. Good air entry to the bases with no decreased or absent breath sounds. Musculoskeletal: Full range of motion to all extremities. No gross deformities appreciated. Neurologic:  Normal speech and language. No gross focal neurologic deficits are appreciated.  Skin:  Skin is warm, dry and intact. Psychiatric: Mood and affect are normal. Speech and behavior are normal. Patient exhibits appropriate insight and judgement.   ____________________________________________   LABS (all labs ordered are listed, but only abnormal results are  displayed)  Labs Reviewed - No data to display ____________________________________________  EKG   ____________________________________________  RADIOLOGY   No results found.  ____________________________________________    PROCEDURES  Procedure(s) performed:    Procedures    Medications - No data to display   ____________________________________________   INITIAL IMPRESSION / ASSESSMENT AND PLAN / ED COURSE  Pertinent labs & imaging results that were available during my care of the patient were reviewed by me and considered in my medical decision making (see chart for details).  Review of the [place] CSRS was performed in accordance of the [place] prior to dispensing any controlled drugs.         Assessment and plan DVT 58 year old male presents to the emergency department to be started on a blood thinner given recent diagnosis of DVT.  Patient was started on Xarelto.  Patient education regarding Xarelto use was given.  He was advised to follow-up with primary care for continued Xarelto management.  All patient questions were answered.     ____________________________________________  FINAL CLINICAL IMPRESSION(S) / ED DIAGNOSES  Final diagnoses:  Acute deep vein thrombosis (DVT) of distal vein of left lower extremity (HCC)      NEW MEDICATIONS STARTED DURING THIS VISIT:  ED Discharge Orders         Ordered    Rivaroxaban (XARELTO) 15 MG TABS tablet  2 times daily with meals     02/05/19 2135    rivaroxaban (XARELTO) 20 MG TABS tablet  Daily with supper     02/05/19 2135              This chart was dictated using voice recognition software/Dragon. Despite best efforts to proofread, errors can occur which can change the meaning. Any change was purely unintentional.    Karren Cobble 02/05/19 2147    Delman Kitten, MD 02/06/19 [phone]

## 2019-02-05 NOTE — ED Notes (Signed)
Patient reports had known blood clot, here to be placed on blood thinners.  Patient is alert and oriented, no acute distress noted.

## 2019-02-05 NOTE — ED Notes (Signed)
Spoke with [place], [place]; she reviewed pt's [place] and will see pt in [place];

## 2019-02-07 ENCOUNTER — Telehealth: Payer: Self-pay | Admitting: *Deleted

## 2019-02-07 ENCOUNTER — Telehealth: Payer: Self-pay | Admitting: Podiatry

## 2019-02-07 ENCOUNTER — Ambulatory Visit: Payer: BC Managed Care – PPO | Admitting: Podiatry

## 2019-02-07 NOTE — Telephone Encounter (Signed)
He has an acute DVT that was found on ultrasound Friday afternoon. He was sent to the ER. Looks like he went but did not stay to be treated. He needs to see his PCP today or go to the ER.

## 2019-02-07 NOTE — Telephone Encounter (Signed)
Dr. Jacqualyn Posey states pt diagnosed with peroneal DVT, contact PCP and refer to them and contact pt.

## 2019-02-07 NOTE — Telephone Encounter (Signed)
Pt had to r/s his appointment to day for his sx consult, his father in law passed and stated that he need to still see Dr. Jacqualyn Posey right away. I scheduled patient on Thursday at 8:45am.

## 2019-02-07 NOTE — Telephone Encounter (Signed)
Dr. Jacqualyn Posey states refer pt to Dr. Gwenlyn Found for treatment. Dr. Jacqualyn Posey states have pt get out to the car about every 2 hours and walk around for about 15 minutes. [facility] scheduled pt for Wednesday 02/09/2019 at 10:15am.

## 2019-02-07 NOTE — Telephone Encounter (Signed)
I called pt, he states he is in [place], his ftr-in-law died and his funeral is tomorrow late and he will not be back in [place] until late tomorrow night. Pt states he sat in the ED for 7.5 hours and still had 15 people ahead of him. Pt states he would like an appt with Dr. Gwenlyn Found. Pt states the ED was not able to pre-cert the medication he needed.

## 2019-02-07 NOTE — Telephone Encounter (Signed)
I informed pt of Wednesday 02/09/2019 10:15am appt and pt states understanding. I informed pt Dr. Jacqualyn Posey had wanted pt to walk around about every 2 hours for 15 minutes on the drive home from [place]. Pt states understanding.

## 2019-02-07 NOTE — Telephone Encounter (Signed)
Dr. Legrand Rams - [facility] states pt is in-active to their practice.

## 2019-02-09 ENCOUNTER — Ambulatory Visit (INDEPENDENT_AMBULATORY_CARE_PROVIDER_SITE_OTHER): Payer: BC Managed Care – PPO | Admitting: Cardiovascular Disease

## 2019-02-09 ENCOUNTER — Other Ambulatory Visit: Payer: Self-pay

## 2019-02-09 ENCOUNTER — Encounter: Payer: Self-pay | Admitting: Cardiovascular Disease

## 2019-02-09 VITALS — BP 170/86 | HR 62 | Temp 96.6°F | Ht 69.0 in | Wt 227.4 lb

## 2019-02-09 DIAGNOSIS — Z008 Encounter for other general examination: Secondary | ICD-10-CM

## 2019-02-09 DIAGNOSIS — I998 Other disorder of circulatory system: Secondary | ICD-10-CM | POA: Diagnosis not present

## 2019-02-09 DIAGNOSIS — I70229 Atherosclerosis of native arteries of extremities with rest pain, unspecified extremity: Secondary | ICD-10-CM

## 2019-02-09 NOTE — Assessment & Plan Note (Signed)
[name] returns today for follow-up.  I just saw him in the office approximately a week ago.  He has a nonhealing wound on his left great toe with tibial vessel disease and moderate chronic renal insufficiency with a creatinine in the 1.3-1.4 range status post remote renal transplant.  I did discuss the case with his podiatrist, Dr. Jacqualyn Posey, who feels that he may need an amputation because of failure to heal.  Given the patient's peripheral vascular disease I am unsure whether he has enough circulation to heal and I suspect he will require an angiogram to document this and potentially treat.  On inspecting his wound today he is bleeding fairly freely suggesting that he has circulation to that area.  Dr. Jacqualyn Posey communicated with the patient's nephrologist who felt comfortable proceeding with angiography knowing that there was a chance of radiocontrast nephropathy.

## 2019-02-09 NOTE — Patient Instructions (Signed)
Medication Instructions:  Your physician recommends that you continue on your current medications as directed. Please refer to the Current Medication list given to you today.  If you need a refill on your cardiac medications before your next appointment, please call your pharmacy.   Lab work: none If you have labs (blood work) drawn today and your tests are completely normal, you will receive your results only by: [name] MyChart Message (if you have MyChart) OR . A paper copy in the mail If you have any lab test that is abnormal or we need to change your treatment, we will call you to review the results.  Testing/Procedures: none  Follow-Up: At [facility], you and your health needs are our priority.  As part of our continuing mission to provide you with exceptional heart care, we have created designated Provider Care Teams.  These Care Teams include your primary Cardiologist (physician) and Advanced Practice Providers (APPs -  Physician Assistants and Nurse Practitioners) who all work together to provide you with the care you need, when you need it. You will need a follow up appointment in 1 months with Dr. Quay Burow.

## 2019-02-09 NOTE — Progress Notes (Signed)
02/09/2019 Raymond [name]   [birth year]  [id number]  Primary Physician System, Pcp Not In Primary Cardiologist: Lorretta Harp MD FACP, [place], [place], [place]  HPI:  Raymond [name] is a 58 y.o.  moderately overweight separated African-American male father of 2 children, grandfather of 2 grandchildren who I last saw in the office 02/01/2019.He i was referred by Dr. Jacqualyn Posey, his podiatrist for peripheral vascular valuation because of a small ulcer on the plantar surface of his right first metatarsal. He has a history of treated hypertension, diabetes and hyperlipidemia. His father had an MI at age 39 and his brother recently had a myocardial infarction as well. He is never had a heart attack or stroke. He denies chest pain or shortness of breath. 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 He apparently stepped on a nail approximately 2 to 3 months ago and developed a wound on the plantar surface of his right first metatarsal which Dr. Jacqualyn Posey has been treating fairly frequently over the last several months. Apparently this is slowly healing. He had Dopplers performed in [place] recently that were told showed normal circulation.  Since I saw him 6 months ago the wound on his right foot is healed.  He is developed a new wound on his left foot with recent Dopplers that showed a left TBI 0.28, monophasic waveforms in his left SFA with tibial vessel disease, occluded anterior tibial and peroneal artery.  I suspect he will need a amputation of that toe since he has documented osteomyelitis according to Dr. Jacqualyn Posey.  Is unclear whether this will heal but I suspect based on his Dopplers that this will be difficult.  I am also concerned that angiography will lead to radiocontrast nephropathy and jeopardize his transplanted kidney.  He saw Dr. Jacqualyn Posey 2 days later who felt that his wound was  unlikely to heal and that he would require amputation.  Dr. Devoria Glassing spoke to the patient's nephrologist echoing my concern about exposure to radiocontrast in the setting of moderate renal insufficiency status post renal artery transplant and apparently the nephrologist felt comfortable proceeding with peripheral angiography potential intervention.    Current Meds  Medication Sig  . amLODipine (NORVASC) 5 MG tablet Take by mouth.  [name] atorvastatin (LIPITOR) 40 MG tablet Take by mouth.  . azaTHIOprine (IMURAN) 50 MG tablet   . Blood Glucose Monitoring Suppl (ACCU-CHEK GUIDE ME) w/Device KIT 1 Piece by Does not apply route as directed.  . cloNIDine (CATAPRES - DOSED IN MG/24 HR) 0.3 mg/24hr patch Place 0.3 mg onto the skin once a week.  . cyclobenzaprine (FLEXERIL) 10 MG tablet Take 1 tablet (10 mg total) by mouth 2 (two) times daily as needed for muscle spasms.  [name] doxazosin (CARDURA) 4 MG tablet Take by mouth.  . doxycycline (VIBRA-TABS) 100 MG tablet Take 1 tablet (100 mg total) by mouth 2 (two) times daily.  [name] glipiZIDE (GLUCOTROL XL) 5 MG 24 hr tablet Take by mouth.  [name] glucose blood (ACCU-CHEK GUIDE) test strip Use as instructed 4 x daily. E11.65  . hydrALAZINE (APRESOLINE) 25 MG tablet Take 25 mg by mouth 2 (two) times daily.  [name] HYDROcodone-acetaminophen (NORCO/VICODIN) 5-325 MG tablet TAKE 1 TABLET BY MOUTH EVERY 4 HOURS AS NEEDED FOR SHOULDER PAIN  . hydrOXYzine (ATARAX/VISTARIL) 25 MG tablet Take 25 mg by mouth 2 (two) times daily as needed.  . Insulin Glargine (  LANTUS SOLOSTAR) 100 UNIT/ML Solostar Pen Inject 50 Units into the skin at bedtime.  . Insulin Pen Needle (B-D ULTRAFINE III SHORT PEN) 31G X 8 MM MISC 1 each by Does not apply route as directed.  . Multiple Vitamin (MULTIVITAMIN) capsule Take 1 capsule by mouth daily.  . mupirocin ointment (BACTROBAN) 2 % Apply 1 application topically 2 (two) times daily.  [name] omeprazole (PRILOSEC) 20 MG capsule Take by mouth.  . oxyCODONE  (ROXICODONE) 5 MG immediate release tablet Take 1 tablet (5 mg total) by mouth every 8 (eight) hours as needed for up to 12 doses.  . Rivaroxaban (XARELTO) 15 MG TABS tablet Take 1 tablet (15 mg total) by mouth 2 (two) times daily with a meal for 21 days.  . rivaroxaban (XARELTO) 20 MG TABS tablet Take 1 tablet (20 mg total) by mouth daily with supper.  . sodium bicarbonate 650 MG tablet Take 1,300 mg by mouth 3 (three) times daily.  . tacrolimus (PROGRAF) 1 MG capsule Take 2 mg by mouth 2 (two) times daily. 2 capsules every morning, 2 capsules in the evening  . Vitamin D, Ergocalciferol, (DRISDOL) 50000 units CAPS capsule Take 50,000 Units by mouth every 30 (thirty) days.     No Known Allergies  Social History   Socioeconomic History  . Marital status: Married    Spouse name: Not on file  . Number of children: Not on file  . Years of education: Not on file  . Highest education level: Not on file  Occupational History  . Not on file  Social Needs  . Financial resource strain: Not on file  . Food insecurity    Worry: Not on file    Inability: Not on file  . Transportation needs    Medical: Not on file    Non-medical: Not on file  Tobacco Use  . Smoking status: Never Smoker  . Smokeless tobacco: Never Used  Substance and Sexual Activity  . Alcohol use: No    Alcohol/week: 0.0 standard drinks  . Drug use: No  . Sexual activity: Not on file  Lifestyle  . Physical activity    Days per week: Not on file    Minutes per session: Not on file  . Stress: Not on file  Relationships  . Social Herbalist on phone: Not on file    Gets together: Not on file    Attends religious service: Not on file    Active member of club or organization: Not on file    Attends meetings of clubs or organizations: Not on file    Relationship status: Not on file  . Intimate partner violence    Fear of current or ex partner: Not on file    Emotionally abused: Not on file    Physically  abused: Not on file    Forced sexual activity: Not on file  Other Topics Concern  . Not on file  Social History Narrative   ** Merged History Encounter **         Review of Systems: General: negative for chills, fever, night sweats or weight changes.  Cardiovascular: negative for chest pain, dyspnea on exertion, edema, orthopnea, palpitations, paroxysmal nocturnal dyspnea or shortness of breath Dermatological: negative for rash Respiratory: negative for cough or wheezing Urologic: negative for hematuria Abdominal: negative for nausea, vomiting, diarrhea, bright red blood per rectum, melena, or hematemesis Neurologic: negative for visual changes, syncope, or dizziness All other systems reviewed and are otherwise negative  except as noted above.    Blood pressure (!) 170/86, pulse 62, temperature (!) 96.6 F (35.9 C), height '5\' 9"'  (1.753 m), weight 227 lb 6.4 oz (103.1 kg), SpO2 99 %.  General appearance: alert and no distress Neck: no adenopathy, no carotid bruit, no JVD, supple, symmetrical, trachea midline and thyroid not enlarged, symmetric, no tenderness/mass/nodules Lungs: clear to auscultation bilaterally Heart: regular rate and rhythm, S1, S2 normal, no murmur, click, rub or gallop Extremities: extremities normal, atraumatic, no cyanosis or edema Pulses: Diminished pedal pulses bilaterally Skin: Ischemic ulcer tip of left great toe Neurologic: Alert and oriented X 3, normal strength and tone. Normal symmetric reflexes. Normal coordination and gait  EKG not performed today  ASSESSMENT AND PLAN:   Critical lower limb ischemia Mr. Bougie returns today for follow-up.  I just saw him in the office approximately a week ago.  He has a nonhealing wound on his left great toe with tibial vessel disease and moderate chronic renal insufficiency with a creatinine in the 1.3-1.4 range status post remote renal transplant.  I did discuss the case with his podiatrist, Dr. Jacqualyn Posey, who  feels that he may need an amputation because of failure to heal.  Given the patient's peripheral vascular disease I am unsure whether he has enough circulation to heal and I suspect he will require an angiogram to document this and potentially treat.  On inspecting his wound today he is bleeding fairly freely suggesting that he has circulation to that area.  Dr. Jacqualyn Posey communicated with the patient's nephrologist who felt comfortable proceeding with angiography knowing that there was a chance of radiocontrast nephropathy.      Lorretta Harp MD FACP,FACC,FAHA, [facility] 02/09/2019 11:05 AM

## 2019-02-10 ENCOUNTER — Telehealth: Payer: Self-pay | Admitting: *Deleted

## 2019-02-10 ENCOUNTER — Ambulatory Visit (INDEPENDENT_AMBULATORY_CARE_PROVIDER_SITE_OTHER): Payer: BC Managed Care – PPO | Admitting: Podiatry

## 2019-02-10 DIAGNOSIS — M86172 Other acute osteomyelitis, left ankle and foot: Secondary | ICD-10-CM

## 2019-02-10 DIAGNOSIS — I82402 Acute embolism and thrombosis of unspecified deep veins of left lower extremity: Secondary | ICD-10-CM

## 2019-02-10 DIAGNOSIS — I739 Peripheral vascular disease, unspecified: Secondary | ICD-10-CM | POA: Diagnosis not present

## 2019-02-10 NOTE — Telephone Encounter (Signed)
Faxed required form, clinicals, and demographics to [place].

## 2019-02-10 NOTE — Telephone Encounter (Signed)
-----   Message from Trula Slade, DPM sent at 02/10/2019  8:40 AM EDT ----- Can you please put in for a infectious disease for osteomyelitis and also a wound care referral for possible HBO.   Note is complete.

## 2019-02-10 NOTE — Progress Notes (Signed)
Subjective: 58 year old male presents the office today for follow-up evaluation of osteomyelitis, infection to his left big toe.  He still on antibiotics, doxycycline.  He is also followed with Dr. Alvester Chou regards to circulation.  When he saw Dr. Alvester Chou yesterday there was bleeding coming from the wound and he felt that there is adequate circulation to the toe despite his arterial studies.  Also given his history of renal transplant and his creatinine concern for kidney injury with the procedure.  He did have a venous duplex which was positive for peroneal DVT.  He was in the emergency department on Friday however he left after he states his foot was feeling better.  He then went to [facility] for the weekend and drove due to a death in the family.  He reports decreased leg pain and swelling also of the toe is been better. Denies any systemic complaints such as fevers, chills, nausea, vomiting. No acute changes since last appointment, and no other complaints at this time.   Objective: AAO x3, NAD DP/PT pulses decreased bilaterally Further granular ulceration distal aspect left hallux without any probing to bone.  There is decreased edema to the toe.  There is no fluctuation or crepitation.  No increase in warmth.  No pain.  No purulence identified. No open lesions or pre-ulcerative lesions.  No pain with calf compression, swelling, warmth, erythema  Assessment: Left hallux osteomyelitis  Plan: -All treatment options discussed with the patient including all alternatives, risks, complications.  -Again discussed amputation of the toe but he declines this.   Discussed pros and cons of limb salvage versus amputation.He states that he wants to try to save the toe.  Continue antibiotics and referred to infectious disease as well as the wound care center for possible hyperbaric oxygen therapy.  Continue offloading.  Elevation. -Discussed with Dr. Alvester Chou need for anticoagulation but he feels no anticoagulants is  necessary.  -Patient encouraged to call the office with any questions, concerns, change in symptoms.   RTC 1 week unless gets into the wound care center  Trula Slade DPM

## 2019-02-14 ENCOUNTER — Encounter (HOSPITAL_BASED_OUTPATIENT_CLINIC_OR_DEPARTMENT_OTHER): Payer: BC Managed Care – PPO

## 2019-02-15 ENCOUNTER — Ambulatory Visit: Payer: BC Managed Care – PPO | Admitting: Cardiovascular Disease

## 2019-02-15 ENCOUNTER — Ambulatory Visit: Payer: BC Managed Care – PPO | Admitting: Internal Medicine

## 2019-02-17 ENCOUNTER — Ambulatory Visit: Payer: BC Managed Care – PPO | Admitting: Podiatry

## 2019-02-21 ENCOUNTER — Encounter (HOSPITAL_BASED_OUTPATIENT_CLINIC_OR_DEPARTMENT_OTHER): Payer: BC Managed Care – PPO | Attending: Internal Medicine

## 2019-02-21 DIAGNOSIS — E11621 Type 2 diabetes mellitus with foot ulcer: Secondary | ICD-10-CM | POA: Insufficient documentation

## 2019-02-21 DIAGNOSIS — M86672 Other chronic osteomyelitis, left ankle and foot: Secondary | ICD-10-CM | POA: Insufficient documentation

## 2019-02-21 DIAGNOSIS — E1140 Type 2 diabetes mellitus with diabetic neuropathy, unspecified: Secondary | ICD-10-CM | POA: Insufficient documentation

## 2019-02-21 DIAGNOSIS — Z940 Kidney transplant status: Secondary | ICD-10-CM | POA: Insufficient documentation

## 2019-02-21 DIAGNOSIS — I10 Essential (primary) hypertension: Secondary | ICD-10-CM | POA: Insufficient documentation

## 2019-02-21 DIAGNOSIS — E1151 Type 2 diabetes mellitus with diabetic peripheral angiopathy without gangrene: Secondary | ICD-10-CM | POA: Insufficient documentation

## 2019-02-21 DIAGNOSIS — E1169 Type 2 diabetes mellitus with other specified complication: Secondary | ICD-10-CM | POA: Diagnosis not present

## 2019-02-21 DIAGNOSIS — L97524 Non-pressure chronic ulcer of other part of left foot with necrosis of bone: Secondary | ICD-10-CM | POA: Insufficient documentation

## 2019-02-21 DIAGNOSIS — Z794 Long term (current) use of insulin: Secondary | ICD-10-CM | POA: Diagnosis not present

## 2019-02-22 ENCOUNTER — Ambulatory Visit (INDEPENDENT_AMBULATORY_CARE_PROVIDER_SITE_OTHER): Payer: BC Managed Care – PPO | Admitting: Internal Medicine

## 2019-02-22 ENCOUNTER — Encounter: Payer: Self-pay | Admitting: Internal Medicine

## 2019-02-22 ENCOUNTER — Other Ambulatory Visit: Payer: Self-pay

## 2019-02-22 VITALS — BP 165/76 | HR 63 | Wt 227.0 lb

## 2019-02-22 DIAGNOSIS — Z23 Encounter for immunization: Secondary | ICD-10-CM

## 2019-02-22 DIAGNOSIS — M869 Osteomyelitis, unspecified: Secondary | ICD-10-CM

## 2019-02-22 DIAGNOSIS — B952 Enterococcus as the cause of diseases classified elsewhere: Secondary | ICD-10-CM | POA: Diagnosis not present

## 2019-02-22 NOTE — Progress Notes (Signed)
RFV: initial visit for great toe of left foot osteomyelitis  Patient ID: Korin Hartwell, male   DOB: Apr 26, 1961, 58 y.o.   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 Distal IP amputation  Seen at wound clinic and given option for hb therapy. Patient has shown me xray picture of foot which shows tip of phalange having signs of destruction per my read  Patient has been on doxycycline without improvement. Micro results showing enterococcus, sensitivity P  Outpatient Encounter Medications as of 02/22/2019  Medication Sig  . amLODipine (NORVASC) 5 MG tablet Take by mouth.  [name] atorvastatin (LIPITOR) 40 MG tablet Take by mouth.  . azaTHIOprine (IMURAN) 50 MG tablet   . Blood Glucose Monitoring Suppl (ACCU-CHEK GUIDE ME) w/Device KIT 1 Piece by Does not apply route as directed.  . cloNIDine (CATAPRES - DOSED IN MG/24 HR) 0.3 mg/24hr patch Place 0.3 mg onto the skin once a week.  . cyclobenzaprine (FLEXERIL) 10 MG tablet Take 1 tablet (10 mg total) by mouth 2 (two) times daily as needed for muscle spasms.  [name] doxazosin (CARDURA) 4 MG tablet Take by mouth.  . doxycycline (VIBRA-TABS) 100 MG tablet Take 1 tablet (100 mg total) by mouth 2 (two) times daily.  [name] glipiZIDE (GLUCOTROL XL) 5 MG 24 hr tablet Take by mouth.  [name] glucose blood (ACCU-CHEK GUIDE) test strip Use as instructed 4 x daily. E11.65  . hydrALAZINE (APRESOLINE) 25 MG tablet Take 25 mg by mouth 2 (two) times daily.  [name] HYDROcodone-acetaminophen (NORCO/VICODIN) 5-325 MG tablet TAKE 1 TABLET BY MOUTH EVERY 4 HOURS AS NEEDED FOR SHOULDER PAIN  . hydrOXYzine (ATARAX/VISTARIL) 25 MG tablet Take 25 mg by mouth 2 (two) times daily as needed.  . Insulin Glargine (LANTUS SOLOSTAR) 100 UNIT/ML Solostar Pen Inject 50 Units into the skin at  bedtime.  . Insulin Pen Needle (B-D ULTRAFINE III SHORT PEN) 31G X 8 MM MISC 1 each by Does not apply route as directed.  . Multiple Vitamin (MULTIVITAMIN) capsule Take 1 capsule by mouth daily.  . mupirocin ointment (BACTROBAN) 2 % Apply 1 application topically 2 (two) times daily.  [name] omeprazole (PRILOSEC) 20 MG capsule Take by mouth.  . oxyCODONE (ROXICODONE) 5 MG immediate release tablet Take 1 tablet (5 mg total) by mouth every 8 (eight) hours as needed for up to 12 doses.  . Rivaroxaban (XARELTO) 15 MG TABS tablet Take 1 tablet (15 mg total) by mouth 2 (two) times daily with a meal for 21 days.  . rivaroxaban (XARELTO) 20 MG TABS tablet Take 1 tablet (20 mg total) by mouth daily with supper.  . sodium bicarbonate 650 MG tablet Take 1,300 mg by mouth 3 (three) times daily.  . tacrolimus (PROGRAF) 1 MG capsule Take 2 mg by mouth 2 (two) times daily. 2 capsules every morning, 2 capsules in the evening  . Vitamin D, Ergocalciferol, (DRISDOL) 50000 units CAPS capsule Take 50,000 Units by mouth every 30 (thirty) days.   No facility-administered encounter medications on file as of 02/22/2019.      Patient Active Problem List   Diagnosis Date Noted  . Critical lower limb ischemia 02/02/2019  . Personal history of noncompliance with medical treatment, presenting hazards to health 10/07/2018  . Critical limb ischemia with history  of revascularization of same extremity 07/27/2018  . Mixed hyperlipidemia 02/19/2018  . Class 1 obesity due to excess calories with serious comorbidity and body mass index (BMI) of 31.0 to 31.9 in adult 02/19/2018  . History of renal transplant 02/19/2018  . Bilateral impacted cerumen 08/31/2017  . Conductive hearing loss, bilateral 08/31/2017  . Nuclear sclerotic cataract of right eye 10/07/2016  . Pseudophakia of left eye 10/07/2016  . Hypertension due to endocrine disorder 03/04/2016  . 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 Renal failure 03/04/2016  . Acute kidney injury ([place]) 12/10/2015  . Immunosuppression ([place]) 12/10/2015  . Nausea vomiting and diarrhea 12/10/2015  . 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) ([place]) 12/10/2015  . Essential hypertension, benign 12/10/2015  . ARF (acute renal failure) ([place]) 12/10/2015  . Pancytopenia ([place]) 12/10/2015  . Encounter for screening colonoscopy 11/15/2014  . GERD (gastroesophageal reflux disease) 11/15/2014     Health Maintenance Due  Topic Date Due  . Hepatitis C Screening  1961/02/10  . PNEUMOCOCCAL POLYSACCHARIDE VACCINE AGE 54-64 HIGH RISK  10/21/1962  . FOOT EXAM  10/21/1970  . OPHTHALMOLOGY EXAM  10/21/1970  . HIV Screening  10/21/1975  . TETANUS/TDAP  10/21/1979  . INFLUENZA VACCINE  12/25/2018  . 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 Blister on 2nd toe. Ulcer on plantar aspect CBC Lab Results  Component Value Date   WBC 4.1 01/29/2019   RBC 4.98 01/29/2019   HGB 12.8 (L) 01/29/2019   HCT 41.5 01/29/2019   PLT 167 01/29/2019   MCV 83.3 01/29/2019   MCH 25.7 (L) 01/29/2019   MCHC 30.8 01/29/2019   RDW 13.4 01/29/2019   LYMPHSABS 1,180 01/24/2019   MONOABS 0.9 09/17/2018   EOSABS 118 01/24/2019    BMET Lab Results  Component Value Date   NA 137 01/29/2019   K 4.6 01/29/2019   CL 102 01/29/2019   CO2 27 01/29/2019   GLUCOSE 325 (H) 01/29/2019   BUN 31 (H) 01/29/2019   CREATININE 1.40 (H) 01/29/2019   CALCIUM 9.5 01/29/2019   GFRNONAA 55 (L) 01/29/2019   GFRAA >60 01/29/2019    Lab Results  Component Value Date   ESRSEDRATE 56 (H) 01/24/2019     Assessment and Plan  Blood work- sed rate and crp, and baseline ck Set up  for abtx Plan for 6 wk iv abtx of daptomycin but see back 2 wi to see if improvement  If no change in swelling would recommend surgery (patient is heistant in doing full frst MT amputation  Sees dr berry for vascular evaluation  Flu shot today  Coordinate with IR to place line.Marland KitchenMarland Kitchen

## 2019-02-23 ENCOUNTER — Telehealth: Payer: Self-pay | Admitting: *Deleted

## 2019-02-23 LAB — CBC WITH DIFFERENTIAL/PLATELET
Absolute Monocytes: 382 cells/uL (ref 200–950)
Basophils Absolute: 29 cells/uL (ref 0–200)
Basophils Relative: 0.8 %
Eosinophils Absolute: 209 cells/uL (ref 15–500)
Eosinophils Relative: 5.8 %
HCT: 43.4 % (ref 38.5–50.0)
Hemoglobin: 13.4 g/dL (ref 13.2–17.1)
Lymphs Abs: 1260 cells/uL (ref 850–3900)
MCH: 25.5 pg — ABNORMAL LOW (ref 27.0–33.0)
MCHC: 30.9 g/dL — ABNORMAL LOW (ref 32.0–36.0)
MCV: 82.5 fL (ref 80.0–100.0)
MPV: 12.1 fL (ref 7.5–12.5)
Monocytes Relative: 10.6 %
Neutro Abs: 1721 cells/uL (ref 1500–7800)
Neutrophils Relative %: 47.8 %
Platelets: 113 10*3/uL — ABNORMAL LOW (ref 140–400)
RBC: 5.26 10*6/uL (ref 4.20–5.80)
RDW: 13.3 % (ref 11.0–15.0)
Total Lymphocyte: 35 %
WBC: 3.6 10*3/uL — ABNORMAL LOW (ref 3.8–10.8)

## 2019-02-23 LAB — BASIC METABOLIC PANEL
BUN: 21 mg/dL (ref 7–25)
CO2: 24 mmol/L (ref 20–32)
Calcium: 9.7 mg/dL (ref 8.6–10.3)
Chloride: 105 mmol/L (ref 98–110)
Creat: 1.32 mg/dL (ref 0.70–1.33)
Glucose, Bld: 304 mg/dL — ABNORMAL HIGH (ref 65–99)
Potassium: 4.8 mmol/L (ref 3.5–5.3)
Sodium: 138 mmol/L (ref 135–146)

## 2019-02-23 LAB — C-REACTIVE PROTEIN: CRP: 1.9 mg/L (ref <8.0)

## 2019-02-23 LAB — SEDIMENTATION RATE: Sed Rate: 22 mm/h — ABNORMAL HIGH (ref 0–20)

## 2019-02-23 NOTE — Telephone Encounter (Signed)
Confirmed with IR team he does not need to stop or hold his Alen Blew for procedure. Patient is aware.

## 2019-02-23 NOTE — Telephone Encounter (Signed)
Called the patient to advise of PICC placement appt at [facility] for Tuesday 03/01/19 at 12 noon. He is to arrive at 1145 at the [facility] admitting. After placement he will have his first dose at Short Stay and [facility] should be calling him soon to make an appt to set up teaching and deliver medication. Advised patient if he has any questions or problems to please give the office a call.  Spoke with Stanton Kidney at [facility] and gave her the appt date and time.  First dose order faxed to [facility] [phone].

## 2019-02-25 ENCOUNTER — Ambulatory Visit: Payer: BC Managed Care – PPO | Admitting: Cardiovascular Disease

## 2019-02-28 ENCOUNTER — Other Ambulatory Visit (HOSPITAL_COMMUNITY): Payer: Self-pay

## 2019-02-28 ENCOUNTER — Encounter (HOSPITAL_BASED_OUTPATIENT_CLINIC_OR_DEPARTMENT_OTHER): Payer: BC Managed Care – PPO | Attending: Internal Medicine | Admitting: Internal Medicine

## 2019-03-01 ENCOUNTER — Ambulatory Visit (HOSPITAL_COMMUNITY)
Admission: RE | Admit: 2019-03-01 | Discharge: 2019-03-01 | Disposition: A | Discharge status: Home / Self Care | Payer: BC Managed Care – PPO | Source: Ambulatory Visit | Attending: Internal Medicine | Admitting: Internal Medicine

## 2019-03-01 ENCOUNTER — Other Ambulatory Visit: Payer: Self-pay | Admitting: Internal Medicine

## 2019-03-01 ENCOUNTER — Other Ambulatory Visit: Payer: Self-pay

## 2019-03-01 ENCOUNTER — Other Ambulatory Visit (HOSPITAL_COMMUNITY): Payer: Self-pay | Admitting: Internal Medicine

## 2019-03-01 ENCOUNTER — Encounter (HOSPITAL_COMMUNITY): Payer: Self-pay | Admitting: Interventional Radiology

## 2019-03-01 ENCOUNTER — Encounter (HOSPITAL_BASED_OUTPATIENT_CLINIC_OR_DEPARTMENT_OTHER): Payer: BC Managed Care – PPO | Attending: Internal Medicine | Admitting: Internal Medicine

## 2019-03-01 DIAGNOSIS — M869 Osteomyelitis, unspecified: Secondary | ICD-10-CM

## 2019-03-01 DIAGNOSIS — E1151 Type 2 diabetes mellitus with diabetic peripheral angiopathy without gangrene: Secondary | ICD-10-CM | POA: Insufficient documentation

## 2019-03-01 DIAGNOSIS — L97524 Non-pressure chronic ulcer of other part of left foot with necrosis of bone: Secondary | ICD-10-CM

## 2019-03-01 DIAGNOSIS — E11621 Type 2 diabetes mellitus with foot ulcer: Secondary | ICD-10-CM | POA: Insufficient documentation

## 2019-03-01 DIAGNOSIS — I10 Essential (primary) hypertension: Secondary | ICD-10-CM | POA: Diagnosis not present

## 2019-03-01 DIAGNOSIS — Z940 Kidney transplant status: Secondary | ICD-10-CM | POA: Diagnosis not present

## 2019-03-01 DIAGNOSIS — M86672 Other chronic osteomyelitis, left ankle and foot: Secondary | ICD-10-CM | POA: Diagnosis not present

## 2019-03-01 DIAGNOSIS — E1169 Type 2 diabetes mellitus with other specified complication: Secondary | ICD-10-CM | POA: Diagnosis not present

## 2019-03-01 HISTORY — PX: IR US GUIDE VASC ACCESS RIGHT: IMG2390

## 2019-03-01 HISTORY — PX: IR FLUORO GUIDE CV LINE RIGHT: IMG2283

## 2019-03-01 MED ORDER — SODIUM CHLORIDE 0.9 % IV SOLN
825.0000 mg | Freq: Once | INTRAVENOUS | Status: AC
  Administered 2019-03-01: 825 mg via INTRAVENOUS
  Filled 2019-03-01: qty 16.5

## 2019-03-01 MED ORDER — LIDOCAINE HCL 1 % IJ SOLN
INTRAMUSCULAR | Status: DC | PRN
  Administered 2019-03-01: 10 mL

## 2019-03-01 MED ORDER — HEPARIN SOD (PORK) LOCK FLUSH 100 UNIT/ML IV SOLN
INTRAVENOUS | Status: AC
  Administered 2019-03-01: 14:00:00 250 [IU]
  Filled 2019-03-01: qty 5

## 2019-03-01 MED ORDER — LIDOCAINE HCL 1 % IJ SOLN
INTRAMUSCULAR | Status: AC
  Filled 2019-03-01: qty 20

## 2019-03-01 NOTE — Progress Notes (Signed)
[name], [name] ([id number]) Visit Report for 03/01/2019 Debridement Details Patient Name: Date of Service: Raymond [name], Raymond [name] 03/01/2019 8:30 AM Medical Record [id number] Patient Account Number: [id number] Date of Birth/Sex: Treating RN: Jun 04, 1960 (58 y.o. Jerilynn Mages) Carlene Coria Primary Care Provider: SYSTEM, PCP Other Clinician: Referring Provider: Treating Provider/Extender:Gurnoor Ursua, Sherre Scarlet, Johnna Acosta in Treatment: 1 Debridement Performed for Wound #1 Left Toe Great Assessment: Performed By: Physician Ricard Dillon., MD Debridement Type: Debridement Severity of Tissue Pre Fat layer exposed Debridement: Level of Consciousness (Pre- Awake and Alert procedure): Pre-procedure Verification/Time Out Taken: Yes - 08:26 Start Time: 08:26 Pain Control: Lidocaine 5% topical ointment Total Area Debrided (L x W): 0.4 (cm) x 0.6 (cm) = 0.24 (cm) Tissue and other material Viable, Non-Viable, Slough, Subcutaneous, Slough debrided: Level: Skin/Subcutaneous Tissue Debridement Description: Excisional Instrument: Curette Bleeding: Minimum Hemostasis Achieved: Pressure End Time: 08:29 Procedural Pain: 0 Post Procedural Pain: 0 Response to Treatment: Procedure was tolerated well Level of Consciousness Awake and Alert (Post-procedure): Post Debridement Measurements of Total Wound Length: (cm) 0.4 Width: (cm) 0.6 Depth: (cm) 0.8 Volume: (cm) 0.151 Character of Wound/Ulcer Post Improved Debridement: Severity of Tissue Post Debridement: Fat layer exposed Post Procedure Diagnosis Same as Pre-procedure Electronic Signature(s) Signed: 03/01/2019 5:29:39 PM By: Linton Ham MD Signed: 03/01/2019 6:04:54 PM By: Carlene Coria RN Entered By: Linton Ham on 03/01/2019 08:35:21 -------------------------------------------------------------------------------- HPI Details Patient Name: Date of Service: Raymond [name], Raymond [name] 03/01/2019 8:30 AM Medical Record [id number]  Patient Account Number: [id number] Date of Birth/Sex: Treating RN: 02/25/1961 (58 y.o. 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 For about 2 months he is been dealing with a wound on the tip of his left great toe. Followed by Dr. Jacqualyn Posey of podiatry. Initially this required debridement of necrotic tip of the area. It was noted to be concerning for osteomyelitis and deep review with Dr. Leigh Aurora plain x-ray shows quite significant bone destruction suggestive of chronic osteomyelitis. He was put on doxycycline by Dr. Jacqualyn Posey. Referred to Dr. Gwenlyn Found His noninvasive tests were done earlier this month. This showed an ABI on the right at 1.34 and a TBI of 0.51 with triphasic waveforms on the left this was 1.34/0.28 with monophasic waveforms. Dr. Gwenlyn Found I think was struggling with the idea of giving somebody with a transplanted kidney contrast media. The patient saw his nephrologist and apparently the nephrologist was supportive of him undergoing the test although I do not actually see this note. His nephrologist is in [place]. The patient went down for a second opinion to another podiatrist in [place] Dr. Skip Estimable. He saw Dr. Raenette Rover on 924. His x-ray also suggested osteomyelitis. The wound was debrided. A culture of this showed a large amount of enterococcus as well as Peptostreptococcus. I believe this was a swab culture. He was started on Augmentin the patient states swelling in the toe is gone down. Past medical history; diabetic neuropathy, end-stage renal disease start status post renal transplant, hypertension, hyperlipidemia, prior history of a right foot wound and right  foot surgery 10/6; patient is been seen by infectious disease Dr. Graylon Good on 9/29. He is starting IV antibiotics to get the PICC line placed today. Also had blood work sed rate and CRP at this point I do not have this result. I spoke to Dr.  Berry last week. He has an appointment with him on 10/13; this is likely going to progress towards angiography. Because of his transplanted kidney it will have to be done carefully with hydration careful attention to the amount of contrast. Dr. Gwenlyn Found is well aware of all these issues. He is originally referred here for consideration of hyperbaric oxygen. He certainly is a candidate for chronic refractory osteomyelitis. He had antibiotics prior to coming here I believe doxycycline Electronic Signature(s) Signed: 03/01/2019 5:29:39 PM By: Linton Ham MD Entered By: Linton Ham on 03/01/2019 08:38:01 -------------------------------------------------------------------------------- Physical Exam Details Patient Name: Date of Service: Raymond [name], Raymond [name] 03/01/2019 8:30 AM Medical Record [id number] Patient Account Number: [id number] Date of Birth/Sex: Treating RN: 05/18/1961 (58 y.o. Oval Linsey Primary Care Provider: SYSTEM, PCP Other Clinician: Referring Provider: Treating Provider/Extender:Lutisha Knoche, Sherre Scarlet, Johnna Acosta in Treatment: 1 Constitutional Patient is hypertensive.. Pulse regular and within target range for patient.[name] Respirations regular, non-labored and within target range.. Temperature is normal and within the target range for the patient.[name] Appears in no distress. Cardiovascular Pedal pulses are nonpalpable. Notes Wound exam; tip of the left great toe probes to bone. Necrotic debris over the surface debrided with a #3 curette also callus around the wound circumference. Some of the tissue here actually looks fairly healthy. There is no surrounding erythema no purulent drainage. Hemostasis with direct pressure Electronic  Signature(s) Signed: 03/01/2019 5:29:39 PM By: Linton Ham MD Entered By: Linton Ham on 03/01/2019 08:39:54 -------------------------------------------------------------------------------- Physician Orders Details Patient Name: Date of Service: Raymond [name], Raymond [name] 03/01/2019 8:30 AM Medical Record [id number] Patient Account Number: [id number] Date of Birth/Sex: Treating RN: 16-Jul-1960 (58 y.o. Jerilynn Mages) Carlene Coria Primary Care Provider: SYSTEM, PCP Other Clinician: Referring Provider: Treating Provider/Extender:Marriah Sanderlin, Sherre Scarlet, Johnna Acosta in Treatment: 1 Verbal / Phone Orders: No Diagnosis Coding ICD-10 Coding Code Description E11.621 Type 2 diabetes mellitus with foot ulcer L97.524 Non-pressure chronic ulcer of other part of left foot with necrosis of bone M86.672 Other chronic osteomyelitis, left ankle and foot E11.51 Type 2 diabetes mellitus with diabetic peripheral angiopathy without gangrene Follow-up Appointments Return Appointment in 1 week. Dressing Change Frequency Wound #1 Left Toe Great Change Dressing every other day. Wound Cleansing Wound #1 Left Toe Great May shower and wash wound with soap and water. - on days dressing is changed Primary Wound Dressing Wound #1 Left Toe Great Calcium Alginate with Silver - lightly pack undermining Secondary Dressing Wound #1 Left Toe Great Kerlix/Rolled Gauze - secure with tape Dry Gauze Off-Loading Open toe surgical shoe to: - left foot Hyperbaric Oxygen Therapy Evaluate for HBO Therapy Indication: - Wagner 3 ulcer left great toe If appropriate for treatment, begin HBOT per protocol: 2.5 ATA for 90 Minutes with 2 Five (5) Minute Air Breaks Total Number of Treatments: - 40 One treatments per day (delivered Monday through Friday unless otherwise specified in Special Instructions below): Antihistamine 30 minutes prior to HBO Treatment, difficulty clearing ears. Finger stick Blood Glucose Pre- and Post-  HBOT Treatment. Follow Hyperbaric Oxygen Glycemia Protocol Radiology X-ray, Chest - pre HBO - (ICD10 L97.524 - Non-pressure chronic ulcer of other part of left foot with necrosis of bone) GLYCEMIA INTERVENTIONS PROTOCOL PRE-HBO GLYCEMIA INTERVENTIONS ACTION INTERVENTION Obtain pre-HBO capillary blood 1 glucose (ensure physician order is in chart). A. Notify HBO physician and await physician orders. 2 If result is 70 mg/dl or below: B. If the result meets the hospital definition of a critical result, follow hospital policy. A. Give patient an 8 ounce Glucerna Shake, an 8  ounce Ensure, or 8 ounces of a Glucerna/Ensure equivalent dietary supplement*. B. Wait 30 minutes. C. Retest patients capillary blood If result is 71 mg/dl to 130 mg/dl: glucose (CBG). D. If result greater than or equal to 110 mg/dl, proceed with HBO. If result less than 110 mg/dl, notify HBO physician and consider holding HBO. If result is 131 mg/dl to 249 mg/dl: A. Proceed with HBO. A. Notify HBO physician and await physician orders. B. It is recommended to hold HBO If result is 250 mg/dl or greater: and do blood/urine ketone testing. C. If the result meets the hospital definition of a critical result, follow hospital policy. POST-HBO GLYCEMIA INTERVENTIONS ACTION INTERVENTION Obtain post HBO capillary blood 1 glucose (ensure physician order is in chart). A. Notify HBO physician and await physician orders. 2 If result is 70 mg/dl or below: B. If the result meets the hospital definition of a critical result, follow hospital policy. A. Give patient an 8 ounce Glucerna Shake, an 8 ounce Ensure, or 8 ounces of a Glucerna/Ensure equivalent dietary supplement*. B. Wait 15 minutes for symptoms of hypoglycemia (i.e. nervousness, anxiety, sweating, chills, If result is 71 mg/dl to 100 mg/dl: clamminess, irritability, confusion, tachycardia or dizziness). C. If patient asymptomatic, discharge  patient. If patient symptomatic, repeat capillary blood glucose (CBG) and notify HBO physician. If result is 101 mg/dl to 249 mg/dl: A. Discharge patient. A. Notify HBO physician and await physician orders. B. It is recommended to do If result is 250 mg/dl or greater: blood/urine ketone testing. C. If the result meets the hospital definition of a critical result, follow hospital policy. *Juice or candies are NOT equivalent products. If patient refuses the Glucerna or Ensure, please consult the hospital dietitian for an appropriate substitute. Electronic Signature(s) Signed: 03/01/2019 5:29:39 PM By: Linton Ham MD Signed: 03/01/2019 6:04:54 PM By: Carlene Coria RN Entered By: Carlene Coria on 03/01/2019 08:33:20 -------------------------------------------------------------------------------- Prescription 03/01/2019 Patient Name: [name] Provider: Linton Ham MD Date of Birth: 11-08-1960 NPI#: [id number] Sex: M DEA#: [id number] Phone #: [phone] License #: [id number] Patient Address: [address] [place], [place] [address] [address], [place] [address] [phone] Allergies No Known Allergies Provider's Orders X-ray, Chest - ICD10: L97.524 - pre HBO Signature(s): Date(s): Electronic Signature(s) Signed: 03/01/2019 5:29:39 PM By: Linton Ham MD Signed: 03/01/2019 6:04:54 PM By: Carlene Coria RN Entered By: Carlene Coria on 03/01/2019 08:33:20 --------------------------------------------------------------------------------  Problem List Details Patient Name: Date of Service: Raymond [name], Raymond [name] 03/01/2019 8:30 AM Medical Record [id number] Patient Account Number: [id number] Date of Birth/Sex: Treating RN: [birth year] (58 y.o. Jerilynn Mages) Carlene Coria Primary Care Provider: SYSTEM, PCP Other Clinician: Referring Provider: Treating Provider/Extender:Kadien Lineman, Sherre Scarlet, Johnna Acosta  in Treatment: 1 Active Problems ICD-10 Evaluated Encounter Code Description Active Date Today Diagnosis E11.621 Type 2 diabetes mellitus with foot ulcer 02/21/2019 No Yes L97.524 Non-pressure chronic ulcer of other part of left foot 02/21/2019 No Yes with necrosis of bone M86.672 Other chronic osteomyelitis, left ankle and foot 02/21/2019 No Yes E11.51 Type 2 diabetes mellitus with diabetic peripheral 02/21/2019 No Yes angiopathy without gangrene Inactive Problems Resolved Problems Electronic Signature(s) Signed: 03/01/2019 5:29:39 PM By: Linton Ham MD Entered By: Linton Ham on 03/01/2019 08:34:57 -------------------------------------------------------------------------------- Progress Note Details Patient Name: Date of Service: Raymond [name], Raymond [name] 03/01/2019 8:30 AM Medical Record [id number] Patient Account Number: [id number] Date of Birth/Sex: Treating RN: 13-Oct-1960 (58 y.o. 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 For about 2 months he is been dealing with a wound on the tip of his left great toe. Followed by Dr. Jacqualyn Posey of podiatry. Initially this required debridement of necrotic tip of the area. It was noted to be concerning for osteomyelitis and deep review with Dr. Leigh Aurora plain x-ray shows quite significant bone destruction suggestive of chronic osteomyelitis. He was put on doxycycline by Dr. Jacqualyn Posey. Referred to Dr. Gwenlyn Found His noninvasive tests were done earlier this month. This showed an ABI on the right at 1.34 and a TBI of 0.51 with triphasic waveforms on the left this was 1.34/0.28 with monophasic waveforms. Dr. Gwenlyn Found I think was struggling  with the idea of giving somebody with a transplanted kidney contrast media. The patient saw his nephrologist and apparently the nephrologist was supportive of him undergoing the test although I do not actually see this note. His nephrologist is in [place]. The patient went down for a second opinion to another podiatrist in [place] Dr. Skip Estimable. He saw Dr. Raenette Rover on 924. His x-ray also suggested osteomyelitis. The wound was debrided. A culture of this showed a large amount of enterococcus as well as Peptostreptococcus. I believe this was a swab culture. He was started on Augmentin the patient states swelling in the toe is gone down. Past medical history; diabetic neuropathy, end-stage renal disease start status post renal transplant, hypertension, hyperlipidemia, prior history of a right foot wound and right foot surgery 10/6; patient is been seen by infectious disease Dr. Graylon Good on 9/29. He is starting IV antibiotics to get the PICC line placed today. Also had blood work sed rate and CRP at this point I do not have this result. I spoke to Dr. Gwenlyn Found last week. He has an appointment with him on 10/13; this is likely going to progress towards angiography. Because of his transplanted kidney it will have to be done carefully with hydration careful attention to the amount of contrast. Dr. Gwenlyn Found is well aware of all these issues. He is originally referred here for consideration of hyperbaric oxygen. He certainly is a candidate for chronic refractory osteomyelitis. He had antibiotics prior to coming here I believe doxycycline Objective Constitutional Patient is hypertensive.. Pulse regular and within target range for patient.[name] Respirations regular, non-labored and within target range.. Temperature is normal and within the target range for the patient.[name] Appears in no distress. Vitals Time Taken: 8:08 AM, Height: 69 in, Weight: 226 lbs, BMI: 33.4, Temperature: 98.5 F, Pulse: 57 bpm, Respiratory  Rate: 18 breaths/min, Blood Pressure: 178/71 mmHg, Capillary Blood Glucose: 170 mg/dl. General Notes: glucose per pt report Cardiovascular Pedal pulses are nonpalpable. General Notes: Wound exam; tip of the left great toe probes to bone. Necrotic debris over the surface debrided with a #3 curette also callus around the wound circumference. Some of the tissue here actually looks fairly healthy. There is no surrounding erythema no purulent drainage. Hemostasis with direct pressure Integumentary (Hair, Skin) Wound #1 status is Open. Original cause of wound was Blister. The wound is located on the Left Toe Great. The wound measures 0.4cm length x 0.6cm width x 0.8cm depth; 0.188cm^2 area and 0.151cm^3 volume. There is bone and Fat Layer (Subcutaneous Tissue) Exposed exposed. There is no tunneling noted, however, there is undermining starting at 12:00 and ending at 12:00 with  a maximum distance of 0.3cm. There is a medium amount of serosanguineous drainage noted. The wound margin is well defined and not attached to the wound base. There is small (1-33%) red granulation within the wound bed. There is a large (67-100%) amount of necrotic tissue within the wound bed including Adherent Slough. 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 There was a Excisional Skin/Subcutaneous Tissue Debridement with a total area of 0.24 sq cm performed by Ricard Dillon., MD. With the following instrument(s): Curette to remove Viable and Non-Viable tissue/material. Material removed includes  Subcutaneous Tissue and Slough and after achieving pain control using Lidocaine 5% topical ointment. No specimens were taken. A time out was conducted at 08:26, prior to the start of the procedure. A Minimum amount of bleeding was controlled with Pressure. The procedure was tolerated well with a pain level of 0 throughout and a pain level of 0 following the procedure. Post Debridement Measurements: 0.4cm length x 0.6cm width x 0.8cm depth; 0.151cm^3 volume. Character of Wound/Ulcer Post Debridement is improved. Severity of Tissue Post Debridement is: Fat layer exposed. Post procedure Diagnosis Wound #1: Same as Pre-Procedure Plan Follow-up Appointments: Return Appointment in 1 week. Dressing Change Frequency: Wound #1 Left Toe Great: Change Dressing every other day. Wound Cleansing: Wound #1 Left Toe Great: May shower and wash wound with soap and water. - on days dressing is changed Primary Wound Dressing: Wound #1 Left Toe Great: Calcium Alginate with Silver - lightly pack undermining Secondary Dressing: Wound #1 Left Toe Great: Kerlix/Rolled Gauze - secure with tape Dry Gauze Off-Loading: Open toe surgical shoe to: - left foot Hyperbaric Oxygen Therapy: Evaluate for HBO Therapy Indication: - Wagner 3 ulcer left great toe If appropriate for treatment, begin HBOT per protocol: 2.5 ATA for 90 Minutes with 2 Five (5) Minute Air Breaks Total Number of Treatments: - 40 One treatments per day (delivered Monday through Friday unless otherwise specified in Special Instructions below): Antihistamine 30 minutes prior to HBO Treatment, difficulty clearing ears. Finger stick Blood Glucose Pre- and Post- HBOT Treatment. Follow Hyperbaric Oxygen Glycemia Protocol Radiology ordered were: X-ray, Chest - pre HBO 1. The patient is a candidate for hyperbaric oxygen we will order a chest x-ray 2. I will try to check Dr. Storm Frisk orders which IV antibiotic and the results of the lab work 3. I  emphasized to the patient the importance of the follow-up with Dr. Gwenlyn Found on 10/13. Staged angiography 4. The patient is still adamant about refusing amputation however even if he elected to go forward with amputation he would still need angiography and stenting 5. Discoloration of the first and second toe is worrisome for ischemia Electronic Signature(s) Signed: 03/01/2019 5:29:39 PM By: Linton Ham MD Entered By: Linton Ham on 03/01/2019 08:41:20 -------------------------------------------------------------------------------- SuperBill Details Patient Name: Date of Service: Raymond [name], Raymond [name] 03/01/2019 Medical Record [id number] Patient Account Number: [id number] Date of Birth/Sex: Treating RN: 1960/12/13 (58 y.o. Oval Linsey Primary Care Provider: SYSTEM, PCP Other Clinician: Referring Provider: Treating Provider/Extender:Mykeal Carrick, Sherre Scarlet, Johnna Acosta in Treatment: 1 Diagnosis Coding ICD-10 Codes Code Description E11.621 Type 2 diabetes mellitus with foot ulcer  H60.165 Non-pressure chronic ulcer of other part of left foot with necrosis of bone M86.672 Other chronic osteomyelitis, left ankle and foot E11.51 Type 2 diabetes mellitus with diabetic peripheral angiopathy without gangrene Facility Procedures CPT4 Code Description: [id number] 11042 - DEB SUBQ TISSUE 20 SQ CM/< ICD-10 Diagnosis Description L97.524 Non-pressure chronic ulcer of other part of left foot wi E11.621 Type 2 diabetes mellitus with foot ulcer Modifier: th necrosis of Quantity: 1 bone Physician Procedures CPT4 Code Description: [id number] 11042 - WC PHYS SUBQ TISS 20 SQ CM ICD-10 Diagnosis Description L97.524 Non-pressure chronic ulcer of other part of left foot wi E11.621 Type 2 diabetes mellitus with foot ulcer Modifier: th necrosis of Quantity: 1 bone Electronic Signature(s) Signed: 03/01/2019 5:29:39 PM By: Linton Ham MD Entered By: Linton Ham on 03/01/2019 08:41:48

## 2019-03-01 NOTE — Procedures (Signed)
Interventional Radiology Procedure Note  Procedure: US guided right IJ tunneled central venous catheter.   SL, power injectable.    Complications: None Recommendations:  - Ok to use - Do not submerge - Routine line care   Signed,  Dulcy Fanny. Earleen Newport, DO

## 2019-03-06 ENCOUNTER — Other Ambulatory Visit: Payer: Self-pay | Admitting: "Endocrinology

## 2019-03-06 ENCOUNTER — Other Ambulatory Visit: Payer: Self-pay | Admitting: Podiatry

## 2019-03-07 NOTE — Telephone Encounter (Signed)
Patient was to go to infectious disease and the wound care center and Per Dr Jacqualyn Posey did not refill the doxycycline due to not knowing what the patient was prescribed from the mentioned facilities and I did call and leave a message for the patient to call me back to see how patient was doing and to call the [facility] office at [phone]. Lattie Haw

## 2019-03-08 ENCOUNTER — Ambulatory Visit (INDEPENDENT_AMBULATORY_CARE_PROVIDER_SITE_OTHER): Payer: BC Managed Care – PPO | Admitting: Podiatry

## 2019-03-08 ENCOUNTER — Encounter: Payer: Self-pay | Admitting: Podiatry

## 2019-03-08 ENCOUNTER — Encounter (HOSPITAL_BASED_OUTPATIENT_CLINIC_OR_DEPARTMENT_OTHER): Payer: BC Managed Care – PPO | Admitting: Internal Medicine

## 2019-03-08 ENCOUNTER — Ambulatory Visit (INDEPENDENT_AMBULATORY_CARE_PROVIDER_SITE_OTHER): Payer: BC Managed Care – PPO | Admitting: Internal Medicine

## 2019-03-08 ENCOUNTER — Other Ambulatory Visit: Payer: Self-pay

## 2019-03-08 VITALS — BP 150/91 | HR 69 | Temp 98.3°F | Wt 229.0 lb

## 2019-03-08 DIAGNOSIS — L97512 Non-pressure chronic ulcer of other part of right foot with fat layer exposed: Secondary | ICD-10-CM | POA: Diagnosis not present

## 2019-03-08 DIAGNOSIS — E0843 Diabetes mellitus due to underlying condition with diabetic autonomic (poly)neuropathy: Secondary | ICD-10-CM

## 2019-03-08 DIAGNOSIS — B952 Enterococcus as the cause of diseases classified elsewhere: Secondary | ICD-10-CM

## 2019-03-08 DIAGNOSIS — E11621 Type 2 diabetes mellitus with foot ulcer: Secondary | ICD-10-CM | POA: Diagnosis not present

## 2019-03-08 DIAGNOSIS — I999 Unspecified disorder of circulatory system: Secondary | ICD-10-CM

## 2019-03-08 DIAGNOSIS — M869 Osteomyelitis, unspecified: Secondary | ICD-10-CM | POA: Diagnosis not present

## 2019-03-08 NOTE — Progress Notes (Signed)
Subjective:  Patient ID: Raymond [name], male    DOB: 09-10-1960,  MRN: [id number]  Chief Complaint  Patient presents with  . Foot Pain    pt is here for a blister f/u, pt states it was gone but has recently come back, pt states that he would like to have it trimmed down    58 y.o. male presents for wound care.  Patient states that this callus formation has happened over the last couple of weeks.  He just concerned that him being a diabetic he does not want to have an underlying ulceration.  He states that he has neuropathy to that side on the right foot however he has been able to feel little bit of pain.   Review of Systems: Negative except as noted in the HPI. Denies N/V/F/Ch.  Past Medical History:  Diagnosis Date  . Chronic kidney disease    Awaiting transplant  . Diabetes ([place])   . Diabetes mellitus without complication ([place])   . Hypertension   . Renal disorder     Current Outpatient Medications:  .  amLODipine (NORVASC) 5 MG tablet, Take by mouth., Disp: , Rfl:  .  amoxicillin-clavulanate (AUGMENTIN) 500-125 MG tablet, Take 1 tablet by mouth 2 (two) times daily., Disp: , Rfl:  .  atorvastatin (LIPITOR) 40 MG tablet, Take by mouth., Disp: , Rfl:  .  azaTHIOprine (IMURAN) 50 MG tablet, , Disp: , Rfl:  .  Blood Glucose Monitoring Suppl (ACCU-CHEK GUIDE ME) w/Device KIT, 1 Piece by Does not apply route as directed., Disp: 1 kit, Rfl: 0 .  cloNIDine (CATAPRES - DOSED IN MG/24 HR) 0.3 mg/24hr patch, Place 0.3 mg onto the skin once a week., Disp: , Rfl:  .  cyclobenzaprine (FLEXERIL) 10 MG tablet, Take 1 tablet (10 mg total) by mouth 2 (two) times daily as needed for muscle spasms., Disp: 20 tablet, Rfl: 0 .  doxazosin (CARDURA) 4 MG tablet, Take by mouth., Disp: , Rfl:  .  doxycycline (VIBRA-TABS) 100 MG tablet, Take 1 tablet (100 mg total) by mouth 2 (two) times daily., Disp: 20 tablet, Rfl: 0 .  glipiZIDE (GLUCOTROL XL) 5 MG 24 hr tablet, Take by mouth., Disp: , Rfl:  .   glucose blood (ACCU-CHEK GUIDE) test strip, Use as instructed 4 x daily. E11.65, Disp: 150 each, Rfl: 5 .  hydrALAZINE (APRESOLINE) 25 MG tablet, Take 25 mg by mouth 2 (two) times daily., Disp: , Rfl:  .  HYDROcodone-acetaminophen (NORCO/VICODIN) 5-325 MG tablet, TAKE 1 TABLET BY MOUTH EVERY 4 HOURS AS NEEDED FOR SHOULDER PAIN, Disp: , Rfl:  .  hydrOXYzine (ATARAX/VISTARIL) 25 MG tablet, Take 25 mg by mouth 2 (two) times daily as needed., Disp: , Rfl:  .  Insulin Pen Needle (B-D ULTRAFINE III SHORT PEN) 31G X 8 MM MISC, 1 each by Does not apply route as directed., Disp: 100 each, Rfl: 3 .  LANTUS SOLOSTAR 100 UNIT/ML Solostar Pen, INJECT 50 UNITS SUBCUTANEOUSLY AT BEDTIME, Disp: 15 mL, Rfl: 0 .  Multiple Vitamin (MULTIVITAMIN) capsule, Take 1 capsule by mouth daily., Disp: , Rfl:  .  mupirocin ointment (BACTROBAN) 2 %, Apply 1 application topically 2 (two) times daily., Disp: 30 g, Rfl: 2 .  omeprazole (PRILOSEC) 20 MG capsule, Take by mouth., Disp: , Rfl:  .  oxyCODONE (ROXICODONE) 5 MG immediate release tablet, Take 1 tablet (5 mg total) by mouth every 8 (eight) hours as needed for up to 12 doses., Disp: 12 tablet, Rfl: 0 .  sodium  bicarbonate 650 MG tablet, Take 1,300 mg by mouth 3 (three) times daily., Disp: , Rfl:  .  tacrolimus (PROGRAF) 1 MG capsule, Take 2 mg by mouth 2 (two) times daily. 2 capsules every morning, 2 capsules in the evening, Disp: , Rfl:  .  Vitamin D, Ergocalciferol, (DRISDOL) 50000 units CAPS capsule, Take 50,000 Units by mouth every 30 (thirty) days., Disp: , Rfl:  .  Rivaroxaban (XARELTO) 15 MG TABS tablet, Take 1 tablet (15 mg total) by mouth 2 (two) times daily with a meal for 21 days., Disp: 42 tablet, Rfl: 0 .  rivaroxaban (XARELTO) 20 MG TABS tablet, Take 1 tablet (20 mg total) by mouth daily with supper., Disp: 30 tablet, Rfl: 0  Social History   Tobacco Use  Smoking Status Never Smoker  Smokeless Tobacco Never Used    No Known Allergies Objective:  There  were no vitals filed for this visit. There is no height or weight on file to calculate BMI. Constitutional Well developed. Well nourished.  Vascular Dorsalis pedis pulses palpable bilaterally. Posterior tibial pulses palpable bilaterally. Capillary refill normal to all digits.  No cyanosis or clubbing noted. Pedal hair growth normal.  Neurologic Normal speech. Oriented to person, place, and time. Protective sensation absent  Dermatologic Wound Location: Right submetatarsal 1 wound Wound Base: Mixed Granular/Fibrotic Peri-wound: Calloused Exudate: Scant/small amount Serosanguinous exudate Wound Measurements:   Orthopedic: No pain to palpation either foot.   Radiographs: None Assessment:   1. Skin ulcer of right foot with fat layer exposed ([place])   2. Diabetes mellitus due to underlying condition with diabetic autonomic neuropathy, unspecified whether long term insulin use ([place])    Plan:  Patient was evaluated and treated and all questions answered.  Ulcer submet 1 -Debridement as below. -Dressed with triple antibiotic and a Band-Aid, DSD. -He will follow up with Dr. Jacqualyn Posey in 1 week  Procedure: Excisional Debridement of Wound Rationale: Removal of non-viable soft tissue from the wound to promote healing.  Anesthesia: none Pre-Debridement Wound Measurements: 0.7 cm x 0.7 cm x 0.2 cm  Post-Debridement Wound Measurements: 0.6 cm x 0.6 cm x 0.2 cm  Type of Debridement: Sharp Excisional Tissue Removed: Non-viable soft tissue Depth of Debridement: subcutaneous tissue. Technique: Sharp excisional debridement to bleeding, viable wound base.  Dressing: Dry, sterile, compression dressing. Disposition: Patient tolerated procedure well. Patient to return in 1 week for follow-up.  No follow-ups on file.

## 2019-03-08 NOTE — Progress Notes (Signed)
Patient ID: Raymond [name], male   DOB: 1961/05/17, 58 y.o.   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 Distal IP amputation  Seen at wound clinic and given option for hb therapy. Patient has shown me xray picture of foot which shows tip of phalange having signs of destruction per my read  Patient has been on doxycycline without improvement. Micro results showing enterococcus, sensitivity P  Had picc line placed on 10/6 for daptomycin  Outpatient Encounter Medications as of 03/08/2019  Medication Sig  . amLODipine (NORVASC) 5 MG tablet Take by mouth.  [name] amoxicillin-clavulanate (AUGMENTIN) 500-125 MG tablet Take 1 tablet by mouth 2 (two) times daily.  [name] atorvastatin (LIPITOR) 40 MG tablet Take by mouth.  . azaTHIOprine (IMURAN) 50 MG tablet   . Blood Glucose Monitoring Suppl (ACCU-CHEK GUIDE ME) w/Device KIT 1 Piece by Does not apply route as directed.  . cloNIDine (CATAPRES - DOSED IN MG/24 HR) 0.3 mg/24hr patch Place 0.3 mg onto the skin once a week.  . cyclobenzaprine (FLEXERIL) 10 MG tablet Take 1 tablet (10 mg total) by mouth 2 (two) times daily as needed for muscle spasms.  [name] doxazosin (CARDURA) 4 MG tablet Take by mouth.  . doxycycline (VIBRA-TABS) 100 MG tablet Take 1 tablet (100 mg total) by mouth 2 (two) times daily.  [name] glipiZIDE (GLUCOTROL XL) 5 MG 24 hr tablet Take by mouth.  [name] glucose blood (ACCU-CHEK GUIDE) test strip Use as instructed 4 x daily. E11.65  . hydrALAZINE (APRESOLINE) 25 MG tablet Take 25 mg by mouth 2 (two) times daily.  [name] HYDROcodone-acetaminophen (NORCO/VICODIN) 5-325 MG tablet TAKE 1 TABLET BY MOUTH EVERY 4 HOURS AS NEEDED FOR SHOULDER PAIN  . hydrOXYzine (ATARAX/VISTARIL) 25 MG tablet Take 25 mg by mouth 2 (two) times daily as needed.  . Insulin  Pen Needle (B-D ULTRAFINE III SHORT PEN) 31G X 8 MM MISC 1 each by Does not apply route as directed.  [name] LANTUS SOLOSTAR 100 UNIT/ML Solostar Pen INJECT 50 UNITS SUBCUTANEOUSLY AT BEDTIME  . Multiple Vitamin (MULTIVITAMIN) capsule Take 1 capsule by mouth daily.  . mupirocin ointment (BACTROBAN) 2 % Apply 1 application topically 2 (two) times daily.  [name] omeprazole (PRILOSEC) 20 MG capsule Take by mouth.  . oxyCODONE (ROXICODONE) 5 MG immediate release tablet Take 1 tablet (5 mg total) by mouth every 8 (eight) hours as needed for up to 12 doses.  . sodium bicarbonate 650 MG tablet Take 1,300 mg by mouth 3 (three) times daily.  . tacrolimus (PROGRAF) 1 MG capsule Take 2 mg by mouth 2 (two) times daily. 2 capsules every morning, 2 capsules in the evening  . Vitamin D, Ergocalciferol, (DRISDOL) 50000 units CAPS capsule Take 50,000 Units by mouth every 30 (thirty) days.  . Rivaroxaban (XARELTO) 15 MG TABS tablet Take 1 tablet (15 mg total) by mouth 2 (two) times daily with a meal for 21 days.  . rivaroxaban (XARELTO) 20 MG TABS tablet Take 1 tablet (20 mg total) by mouth daily with supper.   No facility-administered encounter medications on file as of 03/08/2019.      Patient Active Problem List   Diagnosis Date Noted  . Critical lower limb ischemia 02/02/2019  . Personal history of noncompliance with medical treatment, presenting  hazards to health 10/07/2018  . Critical limb ischemia with history of revascularization of same extremity 07/27/2018  . Mixed hyperlipidemia 02/19/2018  . Class 1 obesity due to excess calories with serious comorbidity and body mass index (BMI) of 31.0 to 31.9 in adult 02/19/2018  . History of renal transplant 02/19/2018  . Bilateral impacted cerumen 08/31/2017  . Conductive hearing loss, bilateral 08/31/2017  . Nuclear sclerotic cataract of right eye 10/07/2016  . Pseudophakia of left eye 10/07/2016  . Hypertension due to endocrine disorder 03/04/2016  . 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 Renal failure 03/04/2016  . Acute kidney injury ([place]) 12/10/2015  . Immunosuppression ([place]) 12/10/2015  . Nausea vomiting and diarrhea 12/10/2015  . 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) ([place]) 12/10/2015  . Essential hypertension, benign 12/10/2015  . ARF (acute renal failure) ([place]) 12/10/2015  . Pancytopenia ([place]) 12/10/2015  . Encounter for screening colonoscopy 11/15/2014  . GERD (gastroesophageal reflux disease) 11/15/2014     Health Maintenance Due  Topic Date Due  . Hepatitis C Screening  01-Dec-1960  . PNEUMOCOCCAL POLYSACCHARIDE VACCINE AGE 67-64 HIGH RISK  10/21/1962  . FOOT EXAM  10/21/1970  . OPHTHALMOLOGY EXAM  10/21/1970  . HIV Screening  10/21/1975  . TETANUS/TDAP  10/21/1979  . HEMOGLOBIN A1C  01/26/2019     Review of Systems 12 point ros is negative except for skin changes to his foot Physical Exam   BP (!) 150/91   Pulse 69   Temp 98.3 F (36.8 C) (Oral)   Wt 229 lb (103.9 kg)   BMI 33.82 kg/m    Physical Exam  Constitutional: He is oriented to person, place, and time. He appears well-developed and well-nourished. No distress.  HENT:  Mouth/Throat: Oropharynx is clear and moist. No oropharyngeal exudate.  Cardiovascular: Normal rate, regular rhythm and normal heart sounds. Exam reveals no gallop and no friction rub.  No murmur heard.  Pulmonary/Chest: Effort normal and breath sounds normal. No respiratory distress. He has no wheezes.  Ext: 2nd digit of his foot, increasing   Skin: Skin is warm and dry. No rash noted. No erythema.  Psychiatric: He has a normal mood and affect. His behavior is normal.    CBC Lab Results  Component Value Date   WBC 3.6 (L) 02/22/2019   RBC 5.26 02/22/2019   HGB 13.4 02/22/2019   HCT 43.4 02/22/2019   PLT 113 (L) 02/22/2019   MCV 82.5 02/22/2019   MCH 25.5 (L)  02/22/2019   MCHC 30.9 (L) 02/22/2019   RDW 13.3 02/22/2019   LYMPHSABS 1,260 02/22/2019   MONOABS 0.9 09/17/2018   EOSABS 209 02/22/2019    BMET Lab Results  Component Value Date   NA 138 02/22/2019   K 4.8 02/22/2019   CL 105 02/22/2019   CO2 24 02/22/2019   GLUCOSE 304 (H) 02/22/2019   BUN 21 02/22/2019   CREATININE 1.32 02/22/2019   CALCIUM 9.7 02/22/2019   GFRNONAA 55 (L) 01/29/2019   GFRAA >60 01/29/2019      Assessment and Plan  2nd digit looking more dark than 2 weeks ago. Will see if can see dr berry sooner Continue with daptomycin

## 2019-03-09 ENCOUNTER — Encounter (HOSPITAL_BASED_OUTPATIENT_CLINIC_OR_DEPARTMENT_OTHER): Payer: BC Managed Care – PPO | Admitting: Physician Assistant

## 2019-03-14 ENCOUNTER — Encounter (HOSPITAL_BASED_OUTPATIENT_CLINIC_OR_DEPARTMENT_OTHER): Payer: BC Managed Care – PPO | Admitting: Internal Medicine

## 2019-03-15 ENCOUNTER — Encounter (HOSPITAL_BASED_OUTPATIENT_CLINIC_OR_DEPARTMENT_OTHER): Payer: BC Managed Care – PPO | Admitting: Internal Medicine

## 2019-03-16 ENCOUNTER — Encounter (HOSPITAL_BASED_OUTPATIENT_CLINIC_OR_DEPARTMENT_OTHER): Payer: BC Managed Care – PPO | Admitting: Physician Assistant

## 2019-03-17 ENCOUNTER — Encounter (HOSPITAL_BASED_OUTPATIENT_CLINIC_OR_DEPARTMENT_OTHER): Payer: BC Managed Care – PPO | Admitting: Internal Medicine

## 2019-03-17 ENCOUNTER — Ambulatory Visit: Payer: BC Managed Care – PPO | Admitting: Podiatry

## 2019-03-17 ENCOUNTER — Other Ambulatory Visit: Payer: BC Managed Care – PPO | Admitting: Orthotics

## 2019-03-18 ENCOUNTER — Encounter (HOSPITAL_BASED_OUTPATIENT_CLINIC_OR_DEPARTMENT_OTHER): Payer: BC Managed Care – PPO | Admitting: Internal Medicine

## 2019-03-21 ENCOUNTER — Encounter (HOSPITAL_BASED_OUTPATIENT_CLINIC_OR_DEPARTMENT_OTHER): Payer: BC Managed Care – PPO | Admitting: Internal Medicine

## 2019-03-22 ENCOUNTER — Encounter (HOSPITAL_BASED_OUTPATIENT_CLINIC_OR_DEPARTMENT_OTHER): Payer: BC Managed Care – PPO | Admitting: Internal Medicine

## 2019-03-22 ENCOUNTER — Ambulatory Visit: Payer: BC Managed Care – PPO | Admitting: Cardiovascular Disease

## 2019-03-22 ENCOUNTER — Other Ambulatory Visit (HOSPITAL_COMMUNITY)
Admission: RE | Admit: 2019-03-22 | Discharge: 2019-03-22 | Disposition: A | Discharge status: Home / Self Care | Payer: BC Managed Care – PPO | Source: Skilled Nursing Facility | Attending: Internal Medicine | Admitting: Internal Medicine

## 2019-03-22 DIAGNOSIS — E118 Type 2 diabetes mellitus with unspecified complications: Secondary | ICD-10-CM | POA: Diagnosis present

## 2019-03-22 LAB — CBC WITH DIFFERENTIAL/PLATELET
Abs Immature Granulocytes: 0.03 10*3/uL (ref 0.00–0.07)
Basophils Absolute: 0 10*3/uL (ref 0.0–0.1)
Basophils Relative: 1 %
Eosinophils Absolute: 0.1 10*3/uL (ref 0.0–0.5)
Eosinophils Relative: 5 %
HCT: 42.2 % (ref 39.0–52.0)
Hemoglobin: 12.7 g/dL — ABNORMAL LOW (ref 13.0–17.0)
Immature Granulocytes: 1 %
Lymphocytes Relative: 34 %
Lymphs Abs: 1 10*3/uL (ref 0.7–4.0)
MCH: 25.6 pg — ABNORMAL LOW (ref 26.0–34.0)
MCHC: 30.1 g/dL (ref 30.0–36.0)
MCV: 85.1 fL (ref 80.0–100.0)
Monocytes Absolute: 0.3 10*3/uL (ref 0.1–1.0)
Monocytes Relative: 9 %
Neutro Abs: 1.6 10*3/uL — ABNORMAL LOW (ref 1.7–7.7)
Neutrophils Relative %: 50 %
Platelets: 123 10*3/uL — ABNORMAL LOW (ref 150–400)
RBC: 4.96 MIL/uL (ref 4.22–5.81)
RDW: 14 % (ref 11.5–15.5)
WBC: 3.1 10*3/uL — ABNORMAL LOW (ref 4.0–10.5)
nRBC: 0 % (ref 0.0–0.2)

## 2019-03-22 LAB — BASIC METABOLIC PANEL
Anion gap: 5 (ref 5–15)
BUN: 26 mg/dL — ABNORMAL HIGH (ref 6–20)
CO2: 23 mmol/L (ref 22–32)
Calcium: 9.1 mg/dL (ref 8.9–10.3)
Chloride: 106 mmol/L (ref 98–111)
Creatinine, Ser: 1.18 mg/dL (ref 0.61–1.24)
GFR calc Af Amer: >60 mL/min (ref >60)
GFR calc non Af Amer: >60 mL/min (ref >60)
Glucose, Bld: 458 mg/dL — ABNORMAL HIGH (ref 70–99)
Potassium: 5.4 mmol/L — ABNORMAL HIGH (ref 3.5–5.1)
Sodium: 134 mmol/L — ABNORMAL LOW (ref 135–145)

## 2019-03-22 LAB — CK: Total CK: 340 U/L (ref 49–397)

## 2019-03-24 ENCOUNTER — Encounter (HOSPITAL_BASED_OUTPATIENT_CLINIC_OR_DEPARTMENT_OTHER): Payer: BC Managed Care – PPO | Admitting: Internal Medicine

## 2019-03-25 ENCOUNTER — Encounter (HOSPITAL_BASED_OUTPATIENT_CLINIC_OR_DEPARTMENT_OTHER): Payer: BC Managed Care – PPO | Admitting: Internal Medicine

## 2019-03-28 ENCOUNTER — Encounter (HOSPITAL_BASED_OUTPATIENT_CLINIC_OR_DEPARTMENT_OTHER): Payer: BC Managed Care – PPO | Admitting: Internal Medicine

## 2019-03-29 ENCOUNTER — Encounter (HOSPITAL_BASED_OUTPATIENT_CLINIC_OR_DEPARTMENT_OTHER): Payer: BC Managed Care – PPO | Admitting: Internal Medicine

## 2019-03-29 ENCOUNTER — Ambulatory Visit: Payer: BC Managed Care – PPO | Admitting: Cardiology

## 2019-03-31 ENCOUNTER — Encounter (HOSPITAL_BASED_OUTPATIENT_CLINIC_OR_DEPARTMENT_OTHER): Payer: BC Managed Care – PPO | Admitting: Internal Medicine

## 2019-04-01 ENCOUNTER — Encounter (HOSPITAL_BASED_OUTPATIENT_CLINIC_OR_DEPARTMENT_OTHER): Payer: BC Managed Care – PPO | Admitting: Internal Medicine

## 2019-04-01 ENCOUNTER — Other Ambulatory Visit: Payer: Self-pay

## 2019-04-01 ENCOUNTER — Ambulatory Visit (INDEPENDENT_AMBULATORY_CARE_PROVIDER_SITE_OTHER): Payer: BC Managed Care – PPO | Admitting: Cardiovascular Disease

## 2019-04-01 ENCOUNTER — Encounter: Payer: Self-pay | Admitting: Cardiovascular Disease

## 2019-04-01 VITALS — BP 126/70 | HR 68 | Temp 96.1°F | Ht 69.5 in | Wt 234.0 lb

## 2019-04-01 DIAGNOSIS — I739 Peripheral vascular disease, unspecified: Secondary | ICD-10-CM

## 2019-04-01 DIAGNOSIS — Z01818 Encounter for other preprocedural examination: Secondary | ICD-10-CM | POA: Diagnosis not present

## 2019-04-01 DIAGNOSIS — I70229 Atherosclerosis of native arteries of extremities with rest pain, unspecified extremity: Secondary | ICD-10-CM

## 2019-04-01 DIAGNOSIS — I998 Other disorder of circulatory system: Secondary | ICD-10-CM | POA: Diagnosis not present

## 2019-04-01 NOTE — Assessment & Plan Note (Signed)
Raymond [name] returns for follow-up.  He has a new wound on his left second toe with gangrene and critical limb ischemia.  He does have monophasic SFA and popliteal waveforms with an occluded AT.  He has had renal transplant with normal creatinines.  Dr. Earleen Newport, his podiatrist.  Has spoken to his nephrologist in [place] who felt comfortable with him undergoing angiography.  We will set this up next week.

## 2019-04-01 NOTE — Patient Instructions (Addendum)
Medication Instructions:  No changes *If you need a refill on your cardiac medications before your next appointment, please call your pharmacy*  Please call us to confirm if you are taking the Xarelto at [phone]  Lab Work: Your provider would like for you to have the following labs today: CBC and CMET  If you have labs (blood work) drawn today and your tests are completely normal, you will receive your results only by: [name] MyChart Message (if you have MyChart) OR . A paper copy in the mail If you have any lab test that is abnormal or we need to change your treatment, we will call you to review the results.  Testing/Procedures: Your physician has requested that you have a peripheral vascular angiogram. This exam is performed at the hospital. During this exam IV contrast is used to look at arterial blood flow. Please review the information sheet given for details.  Your physician has requested that you have a lower extremity arterial duplex one week after the procedure on 04/07/2019. During this test, ultrasound is used to evaluate arterial blood flow in the legs. Allow one hour for this exam. There are no restrictions or special instructions. This will take place at [place], [address].  Your physician has requested that you have an ankle brachial index (ABI) one week after the procedure on 11/12. During this test an ultrasound and blood pressure cuff are used to evaluate the arteries that supply the arms and legs with blood. Allow thirty minutes for this exam. There are no restrictions or special instructions. This will take place at [place], [address].    Follow-Up: At [facility], you and your health needs are our priority.  As part of our continuing mission to provide you with exceptional heart care, we have created designated Provider Care Teams.  These Care Teams include your primary Cardiologist (physician) and Advanced Practice Providers (APPs -  Physician  Assistants and Nurse Practitioners) who all work together to provide you with the care you need, when you need it.  Your next appointment:   2-3 weeks after the procedure on 04/07/2019 with Dr. Gwenlyn Found  Other Instructions    [facility] CARDIOVASCULAR DIVISION [facility] [place] [address] Dept: [phone] Loc: [place]  04/01/2019  You are scheduled for a Peripheral Angiogram on Thursday, November 12 with Dr. Quay Burow.  1. Please arrive at the [facility] (Main Entrance A) at [facility]: [address] [place], [place] [address] at 9:30 AM (This time is two hours before your procedure to ensure your preparation). Free valet parking service is available.   Special note: Every effort is made to have your procedure done on time. Please understand that emergencies sometimes delay scheduled procedures.  2. Diet: Do not eat solid foods after midnight.  The patient may have clear liquids until 5am upon the day of the procedure.  3. Labs:  You will need to have the coronavirus test completed prior to your procedure. An appointment has been made at 8:15 on 11/09. This is a Drive Up Visit at [facility] will direct you to the appropriate testing line. Please tell them that you are there for procedure testing. Stay in your car and someone will be with you shortly. Please make sure to have all other labs completed before this test because you will need to stay quarantined until your procedure.  4. Medication instructions in preparation for your procedure: Hold all diabetic medication the morning of the procedure Take half the dose of the Lantus the night before the procedure Hold Xarelto 2 days prior to the procedure (starting 11/10)  On the morning of your procedure, take your Aspirin and any morning medicines NOT listed above.  You may use sips of water.  5. Plan for  one night stay--bring personal belongings. 6. Bring a current list of your medications and current insurance cards. 7. You MUST have a responsible person to drive you home. 8. Someone MUST be with you the first 24 hours after you arrive home or your discharge will be delayed. 9. Please wear clothes that are easy to get on and off and wear slip-on shoes.  Thank you for allowing [place] to care for you!   -- [facility]

## 2019-04-01 NOTE — Progress Notes (Signed)
04/01/2019 Raymond [name]   1960-08-11  [id number]  Primary Physician System, Pcp Not In Primary Cardiologist: Lorretta Harp MD FACP, [place], [place], [place]  HPI:  Raymond [name] is a 58 y.o.  moderately overweight separated African-American male father of 2 children, grandfather of 2 grandchildren whoI last saw in the office  02/09/2019.He i was referred by Dr. Jacqualyn Posey, his podiatrist for peripheral vascular valuation because of a small ulcer on the plantar surface of his right first metatarsal. He has a history of treated hypertension, diabetes and hyperlipidemia. His father had an MI at age 69 and his brother recently had a myocardial infarction as well. He is never had a heart attack or stroke. He denies chest pain or shortness of breath. 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 He apparently stepped on a nail approximately 2 to 3 months ago and developed a wound on the plantar surface of his right first metatarsal which Dr. Jacqualyn Posey has been treating fairly frequently over the last several months. Apparently this is slowly healing. He had Dopplers performed in [place] recently that were told showed normal circulation.Since I saw him 6 months ago the wound on his right foot is healed. He is developed a new wound on his left foot with recent Dopplers that showed a left TBI 0.28, monophasic waveforms in his left SFA with tibial vessel disease, occluded anterior tibial and peroneal artery. I suspect he will need a amputation of that toe since he has documented osteomyelitis according to Dr. Jacqualyn Posey. Is unclear whether this will heal but I suspect based on his Dopplers that this will be difficult. I am also concerned that angiography will lead to radiocontrast nephropathy and jeopardize his transplanted kidney.  He saw Dr. Jacqualyn Posey  who felt that his wound was unlikely  to heal and that he would require amputation.  Dr. Devoria Glassing spoke to the patient's nephrologist echoing my concern about exposure to radiocontrast in the setting of moderate renal insufficiency status post renal artery transplant and apparently the nephrologist felt comfortable proceeding with peripheral angiography potential intervention.  Since I saw him 2 months ago he developed a new wound on his left second toe which appears gangrenous.  It is not painful.  He will need angiography and potential endovascular therapy for limb salvage.   Current Meds  Medication Sig  . amLODipine (NORVASC) 5 MG tablet Take by mouth.  [name] amoxicillin-clavulanate (AUGMENTIN) 500-125 MG tablet Take 1 tablet by mouth 2 (two) times daily.  [name] atorvastatin (LIPITOR) 40 MG tablet Take by mouth.  . azaTHIOprine (IMURAN) 50 MG tablet   . Blood Glucose Monitoring Suppl (ACCU-CHEK GUIDE ME) w/Device KIT 1 Piece by Does not apply route as directed.  . cloNIDine (CATAPRES - DOSED IN MG/24 HR) 0.3 mg/24hr patch Place 0.3 mg onto the skin once a week.  . cyclobenzaprine (FLEXERIL) 10 MG tablet Take 1 tablet (10 mg total) by mouth 2 (two) times daily as needed for muscle spasms.  [name] doxazosin (CARDURA) 4 MG tablet Take by mouth.  . doxycycline (VIBRA-TABS) 100 MG tablet Take 1 tablet (100 mg total) by mouth 2 (two) times daily.  [name] glipiZIDE (GLUCOTROL XL) 5 MG 24 hr tablet Take by mouth.  [name] glucose blood (ACCU-CHEK GUIDE) test strip Use as instructed 4 x daily. E11.65  . hydrALAZINE (APRESOLINE) 25 MG tablet Take 25 mg by mouth 2 (  two) times daily.  [name] HYDROcodone-acetaminophen (NORCO/VICODIN) 5-325 MG tablet TAKE 1 TABLET BY MOUTH EVERY 4 HOURS AS NEEDED FOR SHOULDER PAIN  . hydrOXYzine (ATARAX/VISTARIL) 25 MG tablet Take 25 mg by mouth 2 (two) times daily as needed.  . Insulin Pen Needle (B-D ULTRAFINE III SHORT PEN) 31G X 8 MM MISC 1 each by Does not apply route as directed.  [name] LANTUS SOLOSTAR 100 UNIT/ML Solostar Pen INJECT  50 UNITS SUBCUTANEOUSLY AT BEDTIME  . Multiple Vitamin (MULTIVITAMIN) capsule Take 1 capsule by mouth daily.  . mupirocin ointment (BACTROBAN) 2 % Apply 1 application topically 2 (two) times daily.  [name] omeprazole (PRILOSEC) 20 MG capsule Take by mouth.  . oxyCODONE (ROXICODONE) 5 MG immediate release tablet Take 1 tablet (5 mg total) by mouth every 8 (eight) hours as needed for up to 12 doses.  . sodium bicarbonate 650 MG tablet Take 1,300 mg by mouth 3 (three) times daily.  . tacrolimus (PROGRAF) 1 MG capsule Take 2 mg by mouth 2 (two) times daily. 2 capsules every morning, 2 capsules in the evening  . Vitamin D, Ergocalciferol, (DRISDOL) 50000 units CAPS capsule Take 50,000 Units by mouth every 30 (thirty) days.     No Known Allergies  Social History   Socioeconomic History  . Marital status: Married    Spouse name: Not on file  . Number of children: Not on file  . Years of education: Not on file  . Highest education level: Not on file  Occupational History  . Not on file  Social Needs  . Financial resource strain: Not on file  . Food insecurity    Worry: Not on file    Inability: Not on file  . Transportation needs    Medical: Not on file    Non-medical: Not on file  Tobacco Use  . Smoking status: Never Smoker  . Smokeless tobacco: Never Used  Substance and Sexual Activity  . Alcohol use: No    Alcohol/week: 0.0 standard drinks  . Drug use: No  . Sexual activity: Not on file  Lifestyle  . Physical activity    Days per week: Not on file    Minutes per session: Not on file  . Stress: Not on file  Relationships  . Social Herbalist on phone: Not on file    Gets together: Not on file    Attends religious service: Not on file    Active member of club or organization: Not on file    Attends meetings of clubs or organizations: Not on file    Relationship status: Not on file  . Intimate partner violence    Fear of current or ex partner: Not on file     Emotionally abused: Not on file    Physically abused: Not on file    Forced sexual activity: Not on file  Other Topics Concern  . Not on file  Social History Narrative   ** Merged History Encounter **         Review of Systems: General: negative for chills, fever, night sweats or weight changes.  Cardiovascular: negative for chest pain, dyspnea on exertion, edema, orthopnea, palpitations, paroxysmal nocturnal dyspnea or shortness of breath Dermatological: negative for rash Respiratory: negative for cough or wheezing Urologic: negative for hematuria Abdominal: negative for nausea, vomiting, diarrhea, bright red blood per rectum, melena, or hematemesis Neurologic: negative for visual changes, syncope, or dizziness All other systems reviewed and are otherwise negative except as noted above.  Blood pressure 126/70, pulse 68, temperature (!) 96.1 F (35.6 C), height 5' 9.5" (1.765 m), weight 234 lb (106.1 kg).  General appearance: alert and no distress Neck: no adenopathy, no carotid bruit, no JVD, supple, symmetrical, trachea midline and thyroid not enlarged, symmetric, no tenderness/mass/nodules Lungs: clear to auscultation bilaterally Heart: regular rate and rhythm, S1, S2 normal, no murmur, click, rub or gallop Extremities: extremities normal, atraumatic, no cyanosis or edema Pulses: 2+ and symmetric Skin: Skin color, texture, turgor normal. No rashes or lesions Neurologic: Alert and oriented X 3, normal strength and tone. Normal symmetric reflexes. Normal coordination and gait  EKG not performed today  ASSESSMENT AND PLAN:   Critical lower limb ischemia Mr. Baranowski returns for follow-up.  He has a new wound on his left second toe with gangrene and critical limb ischemia.  He does have monophasic SFA and popliteal waveforms with an occluded AT.  He has had renal transplant with normal creatinines.  Dr. Earleen Newport, his podiatrist.  Has spoken to his nephrologist in [place] who  felt comfortable with him undergoing angiography.  We will set this up next week.      Lorretta Harp MD FACP,FACC,FAHA, [facility] 04/01/2019 3:19 PM

## 2019-04-02 LAB — COMPREHENSIVE METABOLIC PANEL
ALT: 27 IU/L (ref 0–44)
AST: 24 IU/L (ref 0–40)
Albumin/Globulin Ratio: 1.4 (ref 1.2–2.2)
Albumin: 4.1 g/dL (ref 3.8–4.9)
Alkaline Phosphatase: 117 IU/L (ref 39–117)
BUN/Creatinine Ratio: 15 (ref 9–20)
BUN: 22 mg/dL (ref 6–24)
Bilirubin Total: 0.3 mg/dL (ref 0.0–1.2)
CO2: 21 mmol/L (ref 20–29)
Calcium: 9.9 mg/dL (ref 8.7–10.2)
Chloride: 104 mmol/L (ref 96–106)
Creatinine, Ser: 1.49 mg/dL — ABNORMAL HIGH (ref 0.76–1.27)
GFR calc Af Amer: 59 mL/min/{1.73_m2} — ABNORMAL LOW (ref >59)
GFR calc non Af Amer: 51 mL/min/{1.73_m2} — ABNORMAL LOW (ref >59)
Globulin, Total: 2.9 g/dL (ref 1.5–4.5)
Glucose: 282 mg/dL — ABNORMAL HIGH (ref 65–99)
Potassium: 5.5 mmol/L — ABNORMAL HIGH (ref 3.5–5.2)
Sodium: 136 mmol/L (ref 134–144)
Total Protein: 7 g/dL (ref 6.0–8.5)

## 2019-04-02 LAB — CBC
Hematocrit: 39.3 % (ref 37.5–51.0)
Hemoglobin: 12.6 g/dL — ABNORMAL LOW (ref 13.0–17.7)
MCH: 26.4 pg — ABNORMAL LOW (ref 26.6–33.0)
MCHC: 32.1 g/dL (ref 31.5–35.7)
MCV: 82 fL (ref 79–97)
Platelets: 114 10*3/uL — ABNORMAL LOW (ref 150–450)
RBC: 4.77 x10E6/uL (ref 4.14–5.80)
RDW: 13.3 % (ref 11.6–15.4)
WBC: 3.1 10*3/uL — ABNORMAL LOW (ref 3.4–10.8)

## 2019-04-04 ENCOUNTER — Ambulatory Visit: Payer: BC Managed Care – PPO | Admitting: Internal Medicine

## 2019-04-04 ENCOUNTER — Other Ambulatory Visit: Payer: Self-pay | Admitting: "Endocrinology

## 2019-04-04 ENCOUNTER — Other Ambulatory Visit: Payer: Self-pay | Admitting: *Deleted

## 2019-04-04 ENCOUNTER — Telehealth: Payer: Self-pay | Admitting: *Deleted

## 2019-04-04 ENCOUNTER — Other Ambulatory Visit: Payer: Self-pay

## 2019-04-04 ENCOUNTER — Other Ambulatory Visit (HOSPITAL_COMMUNITY): Payer: BC Managed Care – PPO

## 2019-04-04 ENCOUNTER — Other Ambulatory Visit (HOSPITAL_COMMUNITY)
Admission: RE | Admit: 2019-04-04 | Discharge: 2019-04-04 | Disposition: A | Discharge status: Home / Self Care | Payer: BC Managed Care – PPO | Source: Ambulatory Visit | Attending: Cardiovascular Disease | Admitting: Cardiovascular Disease

## 2019-04-04 ENCOUNTER — Encounter (HOSPITAL_BASED_OUTPATIENT_CLINIC_OR_DEPARTMENT_OTHER): Payer: BC Managed Care – PPO | Admitting: Internal Medicine

## 2019-04-04 DIAGNOSIS — IMO0002 Reserved for concepts with insufficient information to code with codable children: Secondary | ICD-10-CM

## 2019-04-04 DIAGNOSIS — E1122 Type 2 diabetes mellitus with diabetic chronic kidney disease: Secondary | ICD-10-CM

## 2019-04-04 DIAGNOSIS — I739 Peripheral vascular disease, unspecified: Secondary | ICD-10-CM

## 2019-04-04 DIAGNOSIS — Z20828 Contact with and (suspected) exposure to other viral communicable diseases: Secondary | ICD-10-CM | POA: Diagnosis present

## 2019-04-04 LAB — SARS CORONAVIRUS 2 (TAT 6-24 HRS): SARS Coronavirus 2: NEGATIVE

## 2019-04-04 MED ORDER — SODIUM CHLORIDE 0.9% FLUSH: 3.0000 mL | Freq: Two times a day (BID) | INTRAVENOUS | Status: DC

## 2019-04-04 MED ORDER — LANTUS SOLOSTAR 100 UNIT/ML ~~LOC~~ SOPN: PEN_INJECTOR | SUBCUTANEOUS | 0 refills | Status: DC

## 2019-04-04 NOTE — Telephone Encounter (Signed)
Pt left VM that his medication was denied, not sure which one. Does he need labs? He was suppose to come back In May and has not R/S

## 2019-04-04 NOTE — Telephone Encounter (Signed)
Call placed to the patient in reference to his office visit on 04/01/2019 with Dr. Gwenlyn Found. The patient is scheduled for a PV Angiogram on 04/07/2019. He currently has Xarelto on his medication list. According to epic, he was prescribed this from a hospital visit at [facility] for a DVT.   Raymond [name] is a 58 y.o. male presents to the emergency department as he has recently been diagnosed with a DVT and needs to be started on a blood thinner.  Patient was referred to the emergency department from his podiatrist.  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  He stated that he was not taking the Xarelto because "it was not needed." Message routed to the provider for his knowledge.

## 2019-04-04 NOTE — Telephone Encounter (Signed)
Pt made appt

## 2019-04-04 NOTE — Telephone Encounter (Signed)
LMTCB to review instructions with patient. 

## 2019-04-04 NOTE — Telephone Encounter (Addendum)
Pt contacted pre-catheterization scheduled at [facility] for: Thursday April 07, 2019 11:30 AM Verified arrival time and place: [facility]) at: 6:30 AM-pre procedure hydration-need to discuss new arrival time with patient-he is unaware that he needs to go in early for hydration.   No solid food after midnight prior to cath, clear liquids until 5 AM day of procedure. Contrast allergy: no  Hold: Insulin-AM of procedure. 1/2 Insulin-PM prior to procedure. Glipizide-AM of procedure. Xarelto-pt states he is not taking and never taken Xarelto.  Except hold medications AM meds can be  taken pre-cath with sip of water including: ASA 81 mg   Confirmed patient has responsible adult to drive home post procedure and observe 24 hours after arriving home:   Currently, due to Covid-19 pandemic, only one support person will be allowed with patient. Must be the same support person for that patient's entire stay, will be screened and required to wear a mask. They will be asked to wait in the waiting room for the duration of the patient's stay.  Patients are required to wear a mask when they enter the hospital.      COVID-19 Pre-Screening Questions:  . In the past 7 to 10 days have you had a cough,  shortness of breath, headache, congestion, fever (100 or greater) body aches, chills, sore throat, or sudden loss of taste or sense of smell? . Have you been around anyone with known Covid 19. . Have you been around anyone who is awaiting Covid 19 test results in the past 7 to 10 days? . Have you been around anyone who has been exposed to Covid 19, or has mentioned symptoms of Covid 19 within the past 7 to 10 days?  If you have any concerns/questions about symptoms patients report during screening (either on the phone or at threshold). Contact the provider seeing the patient or DOD for further guidance.  If neither are available contact a member of the leadership  team.  [facility] for pt to review instructions

## 2019-04-05 ENCOUNTER — Encounter: Payer: Self-pay | Admitting: Podiatry

## 2019-04-05 ENCOUNTER — Ambulatory Visit (INDEPENDENT_AMBULATORY_CARE_PROVIDER_SITE_OTHER): Payer: BC Managed Care – PPO

## 2019-04-05 ENCOUNTER — Telehealth: Payer: Self-pay | Admitting: *Deleted

## 2019-04-05 ENCOUNTER — Ambulatory Visit (INDEPENDENT_AMBULATORY_CARE_PROVIDER_SITE_OTHER): Payer: BC Managed Care – PPO | Admitting: Podiatry

## 2019-04-05 ENCOUNTER — Other Ambulatory Visit: Payer: Self-pay

## 2019-04-05 ENCOUNTER — Encounter (HOSPITAL_BASED_OUTPATIENT_CLINIC_OR_DEPARTMENT_OTHER): Payer: BC Managed Care – PPO | Admitting: Internal Medicine

## 2019-04-05 DIAGNOSIS — M86272 Subacute osteomyelitis, left ankle and foot: Secondary | ICD-10-CM | POA: Diagnosis not present

## 2019-04-05 DIAGNOSIS — L97512 Non-pressure chronic ulcer of other part of right foot with fat layer exposed: Secondary | ICD-10-CM

## 2019-04-05 DIAGNOSIS — L97529 Non-pressure chronic ulcer of other part of left foot with unspecified severity: Secondary | ICD-10-CM | POA: Diagnosis not present

## 2019-04-05 DIAGNOSIS — L97519 Non-pressure chronic ulcer of other part of right foot with unspecified severity: Secondary | ICD-10-CM

## 2019-04-05 DIAGNOSIS — I739 Peripheral vascular disease, unspecified: Secondary | ICD-10-CM | POA: Diagnosis not present

## 2019-04-05 DIAGNOSIS — E0843 Diabetes mellitus due to underlying condition with diabetic autonomic (poly)neuropathy: Secondary | ICD-10-CM

## 2019-04-05 NOTE — Progress Notes (Signed)
Subjective: 58 year old male presents the office today for follow-up evaluation of wound on the left big toe with osteomyelitis.  Currently has a PICC line has been treated by infectious disease.  He also did follow-up with Dr. Alvester Chou this past week and there is no to be worsening darkening left second toe.  Because of this he scheduled an angiogram on Thursday.  He also has a callus on the ball of his right foot, pointing to submetatarsal 1.  Has been keeping antibiotic ointment and a bandage to this area daily.  Denies any drainage or pus or any swelling or redness.Denies any systemic complaints such as fevers, chills, nausea, vomiting. No acute changes since last appointment, and no other complaints at this time.   Objective: AAO x3, NAD Neurovascular status appears to be unchanged. The distal aspect of the left hallux is a hyperkeratotic lesion.  No ongoing ulceration the wound appears to be healed.  There is minimal edema there is no severe erythema or warmth there is no drainage or pus.  No fluctuance crepitation.  There is thick hyperkeratotic tissue the distal aspect left second toe with dried blood.  Upon debridement no ongoing ulceration.  There is some hyperpigmented skin changes on the base of the toenail as well.  No skin breakdown otherwise. On the right foot submetatarsal 1 is a hyperkeratotic lesion with dried blood.  Upon debridement there was an ongoing wound present measuring 1 x 0.4 x 0.3 cm.  Is no probing to bone, undermining or tunneling.  No surrounding erythema, ascending cellulitis.  No fluctuation crepitation any malodor. No pain with calf compression, swelling, warmth, erythema  Assessment: Left foot first toe osteomyelitis, hyperpigmented changes to left second toe; right foot ulceration  Plan: -All treatment options discussed with the patient including all alternatives, risks, complications.  -X-rays obtained reviewed bilaterally.  There is cortical changes the left distal  phalanx however does appear to be some consolidation.  No worsening of the osteomyelitis there is no soft tissue is in the left second toe appears to be stable.  Arthritic is present to bilateral first MPJs.  No obvious signs of cortical changes in the right foot. -Debrided the hyperkeratotic tissue on the left first and second toe.  No ulceration at this time that the area is preulcerative.  Infection appears to be stable at this time on the left first toe.  Continue to follow-up with infectious disease.  Scheduled for angio with Dr. Gwenlyn Found on Thursday. -I stop debrided the callus on the right foot reveal underlying wound.  The wound was sharply debrided with a #312 with scalpel any complications.  We will do Silvadene dressing changes daily promote water dressings.  I have ordered a silver collagen dressing today through [facility].  -Monitor for any clinical signs or symptoms of infection and directed to call the office immediately should any occur or go to the ER. -Patient encouraged to call the office with any questions, concerns, change in symptoms.   RTC 1-2 weeks  Trula Slade DPM

## 2019-04-05 NOTE — Telephone Encounter (Signed)
Per verbal from Dr Baxter Flattery called [facility] to extend the patient IV antibiotics until his appt set for 05/04/19. Verified they will continue to draw requested labs per protocol and fax to the office.

## 2019-04-05 NOTE — Telephone Encounter (Signed)
Below copied from [facility] 04/01/19: Notes recorded by Lorretta Harp, MD on 04/02/2019 at 3:31 PM EST  Serum Creat moderately elevated. Need to bring in early that day for hydration   I spoke with patient this morning to discuss arriving early for pre procedure hydration morning of procedure scheduled for 04/07/19.  Pt states he is concerned about recent elevation in serum creatinine and is asking if Dr Gwenlyn Found feels it necessary to have procedure 04/07/19. Pt states he previously discussed having procedure with his kidney doctor, is asking if he should postpone procedure and discuss with kidney doctor again. I discussed Dr Kennon Holter recommendation to go in early for pre procedure hydration. Pt states he is scheduled to see Dr Jacqualyn Posey this afternoon, states plan is to xray his left  foot and see if any improvement.   Pt advised I will forward to Dr Gwenlyn Found for review and recommendations. Pt advised I have not cancelled procedure scheduled for 04/07/19, that I will follow up with him once I have Dr Kennon Holter recommendation about going ahead with procedure.

## 2019-04-06 ENCOUNTER — Other Ambulatory Visit: Payer: Self-pay

## 2019-04-06 ENCOUNTER — Inpatient Hospital Stay
Admission: RE | Admit: 2019-04-06 | Payer: BC Managed Care – PPO | Source: Home / Self Care | Admitting: Cardiovascular Disease

## 2019-04-06 ENCOUNTER — Telehealth: Payer: Self-pay

## 2019-04-06 DIAGNOSIS — R7989 Other specified abnormal findings of blood chemistry: Secondary | ICD-10-CM

## 2019-04-06 NOTE — Telephone Encounter (Signed)
Copied SecureChat message 04/06/19 from Dr Gwenlyn Found:   Raymond [name]'s serum creatinine is 1.45, higher than his prior to readings especially worse in a patient with renal transplant. I think he should be admitted today for rehydration overnight with a stat BMET in the morning. I probably would not do his angiogram if his serum creatinine is greater than 1.3.

## 2019-04-06 NOTE — Progress Notes (Signed)
Put in repeat labs for 4-6 to check kidney function for procedure.

## 2019-04-06 NOTE — Telephone Encounter (Signed)
Pt is aware of Dr Kennon Holter recommendation to be admitted to hospital today for overnight hydration, BMP in the morning, probably would not do angiogram if serum creatinine greater than 1.3.  Pt states he is currently in [place] and not scheduled to be back in [place] until 4:45 AM 04/07/19. Pt is going to try to change his airline reservation to return to [place] today to be admitted to hospital today for overnight hydration prior to procedure tomorrow. Pt states he will call me back in the next hour to let me know his plan.

## 2019-04-06 NOTE — Telephone Encounter (Signed)
Called to cancel procedure for 11/12 d/t pt not able to pre-hydrate today. Advised to hydrate and get repeat labs. Verbalized understanding.

## 2019-04-06 NOTE — Telephone Encounter (Signed)
I spoke with patient and he is unable to change his plans and get back to [place] today. He will be back in [place] tomorrow morning at 4:45 AM.  Pt advised I will forward this information to Dr Oswaldo Done, RN for review and recommendations.

## 2019-04-07 ENCOUNTER — Encounter (HOSPITAL_BASED_OUTPATIENT_CLINIC_OR_DEPARTMENT_OTHER): Payer: BC Managed Care – PPO | Admitting: Internal Medicine

## 2019-04-07 ENCOUNTER — Encounter: Admission: RE | Payer: Self-pay | Source: Home / Self Care

## 2019-04-07 SURGERY — ABDOMINAL AORTOGRAM W/LOWER EXTREMITY
Anesthesia: LOCAL | Laterality: Left

## 2019-04-07 NOTE — Telephone Encounter (Signed)
Will need to re check BMET and resched PV angio

## 2019-04-08 ENCOUNTER — Encounter (HOSPITAL_BASED_OUTPATIENT_CLINIC_OR_DEPARTMENT_OTHER): Payer: BC Managed Care – PPO | Admitting: Internal Medicine

## 2019-04-11 ENCOUNTER — Encounter (HOSPITAL_BASED_OUTPATIENT_CLINIC_OR_DEPARTMENT_OTHER): Payer: BC Managed Care – PPO | Admitting: Internal Medicine

## 2019-04-11 ENCOUNTER — Telehealth: Payer: Self-pay | Admitting: "Endocrinology

## 2019-04-11 ENCOUNTER — Telehealth: Payer: Self-pay

## 2019-04-11 NOTE — Telephone Encounter (Signed)
Pt called and said lately his fingers have been tingling, he thinks this has something to do with his diabetes, he would like to know should he be worried

## 2019-04-11 NOTE — Telephone Encounter (Signed)
Called to re-schedule PV-Angiogram. LM2CB

## 2019-04-12 ENCOUNTER — Ambulatory Visit: Payer: BC Managed Care – PPO | Admitting: "Endocrinology

## 2019-04-12 ENCOUNTER — Encounter (HOSPITAL_BASED_OUTPATIENT_CLINIC_OR_DEPARTMENT_OTHER): Payer: BC Managed Care – PPO | Admitting: Internal Medicine

## 2019-04-12 NOTE — Telephone Encounter (Signed)
It may be, but he never let us work on his diabetes. Diabetes control is what he needs to help the tingling.

## 2019-04-13 NOTE — Telephone Encounter (Signed)
Left msg for pt to call back

## 2019-04-14 ENCOUNTER — Encounter (HOSPITAL_BASED_OUTPATIENT_CLINIC_OR_DEPARTMENT_OTHER): Payer: BC Managed Care – PPO | Admitting: Internal Medicine

## 2019-04-14 NOTE — Telephone Encounter (Signed)
Left message for pt to call back and set up appt with meter/logs.

## 2019-04-15 ENCOUNTER — Encounter (HOSPITAL_BASED_OUTPATIENT_CLINIC_OR_DEPARTMENT_OTHER): Payer: BC Managed Care – PPO | Admitting: Internal Medicine

## 2019-04-18 ENCOUNTER — Telehealth: Payer: Self-pay

## 2019-04-18 ENCOUNTER — Encounter (HOSPITAL_BASED_OUTPATIENT_CLINIC_OR_DEPARTMENT_OTHER): Payer: BC Managed Care – PPO | Admitting: Internal Medicine

## 2019-04-18 NOTE — Telephone Encounter (Signed)
LM2CB to reschedule PV.

## 2019-04-19 ENCOUNTER — Encounter (HOSPITAL_BASED_OUTPATIENT_CLINIC_OR_DEPARTMENT_OTHER): Payer: BC Managed Care – PPO | Admitting: Internal Medicine

## 2019-04-19 ENCOUNTER — Ambulatory Visit: Payer: BC Managed Care – PPO | Admitting: "Endocrinology

## 2019-04-22 ENCOUNTER — Encounter (HOSPITAL_BASED_OUTPATIENT_CLINIC_OR_DEPARTMENT_OTHER): Payer: BC Managed Care – PPO | Admitting: Internal Medicine

## 2019-04-25 ENCOUNTER — Encounter (HOSPITAL_BASED_OUTPATIENT_CLINIC_OR_DEPARTMENT_OTHER): Payer: BC Managed Care – PPO | Admitting: Internal Medicine

## 2019-04-26 ENCOUNTER — Encounter (HOSPITAL_BASED_OUTPATIENT_CLINIC_OR_DEPARTMENT_OTHER): Payer: BC Managed Care – PPO | Admitting: Internal Medicine

## 2019-04-27 ENCOUNTER — Ambulatory Visit (INDEPENDENT_AMBULATORY_CARE_PROVIDER_SITE_OTHER): Payer: BC Managed Care – PPO | Admitting: Podiatry

## 2019-04-27 ENCOUNTER — Other Ambulatory Visit: Payer: Self-pay

## 2019-04-27 DIAGNOSIS — E0843 Diabetes mellitus due to underlying condition with diabetic autonomic (poly)neuropathy: Secondary | ICD-10-CM | POA: Diagnosis not present

## 2019-04-27 DIAGNOSIS — I739 Peripheral vascular disease, unspecified: Secondary | ICD-10-CM

## 2019-04-27 DIAGNOSIS — L98499 Non-pressure chronic ulcer of skin of other sites with unspecified severity: Secondary | ICD-10-CM | POA: Diagnosis not present

## 2019-04-28 ENCOUNTER — Encounter (HOSPITAL_BASED_OUTPATIENT_CLINIC_OR_DEPARTMENT_OTHER): Payer: BC Managed Care – PPO | Admitting: Internal Medicine

## 2019-04-28 ENCOUNTER — Encounter: Payer: Self-pay | Admitting: Podiatry

## 2019-04-28 NOTE — Progress Notes (Signed)
Subjective:  Patient ID: Raymond [name], male    DOB: [birth year],  MRN: [id number]  Chief Complaint  Patient presents with  . Foot Pain    pt is here for a ulcer f/u, pt states that the right foot is oozing as well, pt also shows no other signs of infection    58 y.o. male presents for wound care.  Patient presents with a follow-up from right submet 1 preulcerative lesion.  Patient is well-known to Dr. Earleen Newport who has been taking care of his previous wounds.  He states that he started getting a sore to the right submetatarsal 1 and there was some oozing associated with it.  She wanted scratch that he wanted to make sure that there was not ulceration or something getting worse.  He has been applying some Neosporin as well.  He denies any other acute complaints he denies any other form of infection such as malodor or or redness.   Review of Systems: Negative except as noted in the HPI. Denies N/V/F/Ch.  Past Medical History:  Diagnosis Date  . Chronic kidney disease    Awaiting transplant  . Diabetes ([place])   . Diabetes mellitus without complication ([place])   . Hypertension   . Renal disorder     Current Outpatient Medications:  .  amLODipine (NORVASC) 5 MG tablet, Take by mouth., Disp: , Rfl:  .  amoxicillin-clavulanate (AUGMENTIN) 500-125 MG tablet, Take 1 tablet by mouth 2 (two) times daily., Disp: , Rfl:  .  atorvastatin (LIPITOR) 40 MG tablet, Take by mouth., Disp: , Rfl:  .  azaTHIOprine (IMURAN) 50 MG tablet, , Disp: , Rfl:  .  Blood Glucose Monitoring Suppl (ACCU-CHEK GUIDE ME) w/Device KIT, 1 Piece by Does not apply route as directed., Disp: 1 kit, Rfl: 0 .  cloNIDine (CATAPRES - DOSED IN MG/24 HR) 0.3 mg/24hr patch, Place 0.3 mg onto the skin once a week., Disp: , Rfl:  .  cyclobenzaprine (FLEXERIL) 10 MG tablet, Take 1 tablet (10 mg total) by mouth 2 (two) times daily as needed for muscle spasms., Disp: 20 tablet, Rfl: 0 .  doxazosin (CARDURA) 4 MG tablet, Take by mouth., Disp:  , Rfl:  .  doxycycline (VIBRA-TABS) 100 MG tablet, Take 1 tablet (100 mg total) by mouth 2 (two) times daily., Disp: 20 tablet, Rfl: 0 .  glipiZIDE (GLUCOTROL XL) 5 MG 24 hr tablet, Take by mouth., Disp: , Rfl:  .  glucose blood (ACCU-CHEK GUIDE) test strip, Use as instructed 4 x daily. E11.65, Disp: 150 each, Rfl: 5 .  hydrALAZINE (APRESOLINE) 25 MG tablet, Take 25 mg by mouth 2 (two) times daily., Disp: , Rfl:  .  HYDROcodone-acetaminophen (NORCO/VICODIN) 5-325 MG tablet, TAKE 1 TABLET BY MOUTH EVERY 4 HOURS AS NEEDED FOR SHOULDER PAIN, Disp: , Rfl:  .  hydrOXYzine (ATARAX/VISTARIL) 25 MG tablet, Take 25 mg by mouth 2 (two) times daily as needed., Disp: , Rfl:  .  Insulin Glargine (LANTUS SOLOSTAR) 100 UNIT/ML Solostar Pen, INJECT 50 UNITS SUBCUTANEOUSLY AT BEDTIME, Disp: 15 mL, Rfl: 0 .  Insulin Pen Needle (B-D ULTRAFINE III SHORT PEN) 31G X 8 MM MISC, 1 each by Does not apply route as directed., Disp: 100 each, Rfl: 3 .  Multiple Vitamin (MULTIVITAMIN) capsule, Take 1 capsule by mouth daily., Disp: , Rfl:  .  mupirocin ointment (BACTROBAN) 2 %, Apply 1 application topically 2 (two) times daily., Disp: 30 g, Rfl: 2 .  omeprazole (PRILOSEC) 20 MG capsule, Take by mouth., Disp: ,  Rfl:  .  oxyCODONE (ROXICODONE) 5 MG immediate release tablet, Take 1 tablet (5 mg total) by mouth every 8 (eight) hours as needed for up to 12 doses., Disp: 12 tablet, Rfl: 0 .  sodium bicarbonate 650 MG tablet, Take 1,300 mg by mouth 3 (three) times daily., Disp: , Rfl:  .  tacrolimus (PROGRAF) 1 MG capsule, Take 2 mg by mouth 2 (two) times daily. 2 capsules every morning, 2 capsules in the evening, Disp: , Rfl:  .  Vitamin D, Ergocalciferol, (DRISDOL) 50000 units CAPS capsule, Take 50,000 Units by mouth every 30 (thirty) days., Disp: , Rfl:  .  Rivaroxaban (XARELTO) 15 MG TABS tablet, Take 1 tablet (15 mg total) by mouth 2 (two) times daily with a meal for 21 days., Disp: 42 tablet, Rfl: 0 .  rivaroxaban (XARELTO) 20 MG  TABS tablet, Take 1 tablet (20 mg total) by mouth daily with supper., Disp: 30 tablet, Rfl: 0  Current Facility-Administered Medications:  .  sodium chloride flush (NS) 0.9 % injection 3 mL, 3 mL, Intravenous, Q12H, Lorretta Harp, MD  Social History   Tobacco Use  Smoking Status Never Smoker  Smokeless Tobacco Never Used    No Known Allergies Objective:  There were no vitals filed for this visit. There is no height or weight on file to calculate BMI. Constitutional Well developed. Well nourished.  Vascular Dorsalis pedis pulses palpable bilaterally. Posterior tibial pulses palpable bilaterally. Capillary refill normal to all digits.  No cyanosis or clubbing noted. Pedal hair growth normal.  Neurologic Normal speech. Oriented to person, place, and time. Protective sensation absent  Dermatologic Wound Location: Right submetatarsal 1 preulcerative lesion Wound Base: Mixed Granular/Fibrotic Peri-wound: Calloused Exudate: Scant/small amount Serous exudate Wound Measurements: - none  Orthopedic: No pain to palpation either foot.   Radiographs: None Assessment:   1. 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 PAD (peripheral artery disease) ([place])   3. Superficial ulcerative lesion ([place])    Plan:  Patient was evaluated and treated and all questions answered.  Ulcer submetatarsal 1 right foot preulcerative lesion -The hyperkeratotic lesion was aggressively debrided down to healthy yet macerated tissue.  There is a high risk for a wound development to this area.  I explained to the patient that this may open up into a wound if not aggressively offloaded.  Patient still has offloading shoe with a cut out for the submetatarsal at home.  I explained to the patient the importance of wearing that shoe to offload the submetatarsal 1. -He states that he will go home right away and apply the  shoe on and will continue to ambulate with the shoe until he sees Dr. Earleen Newport again. -Currently there are no clinical signs of infection noted.  I will hold off on any antibiotics at this time.   No follow-ups on file.

## 2019-04-29 ENCOUNTER — Encounter (HOSPITAL_BASED_OUTPATIENT_CLINIC_OR_DEPARTMENT_OTHER): Payer: BC Managed Care – PPO | Admitting: Internal Medicine

## 2019-05-02 ENCOUNTER — Encounter (HOSPITAL_BASED_OUTPATIENT_CLINIC_OR_DEPARTMENT_OTHER): Payer: BC Managed Care – PPO | Admitting: Internal Medicine

## 2019-05-02 ENCOUNTER — Other Ambulatory Visit: Payer: Self-pay

## 2019-05-02 DIAGNOSIS — R7989 Other specified abnormal findings of blood chemistry: Secondary | ICD-10-CM

## 2019-05-02 LAB — BASIC METABOLIC PANEL
BUN/Creatinine Ratio: 20 (ref 9–20)
BUN: 27 mg/dL — ABNORMAL HIGH (ref 6–24)
CO2: 20 mmol/L (ref 20–29)
Calcium: 9.6 mg/dL (ref 8.7–10.2)
Chloride: 102 mmol/L (ref 96–106)
Creatinine, Ser: 1.38 mg/dL — ABNORMAL HIGH (ref 0.76–1.27)
GFR calc Af Amer: 65 mL/min/{1.73_m2} (ref >59)
GFR calc non Af Amer: 56 mL/min/{1.73_m2} — ABNORMAL LOW (ref >59)
Glucose: 357 mg/dL — ABNORMAL HIGH (ref 65–99)
Potassium: 5.2 mmol/L (ref 3.5–5.2)
Sodium: 136 mmol/L (ref 134–144)

## 2019-05-03 ENCOUNTER — Encounter (HOSPITAL_BASED_OUTPATIENT_CLINIC_OR_DEPARTMENT_OTHER): Payer: BC Managed Care – PPO | Admitting: Internal Medicine

## 2019-05-03 NOTE — Progress Notes (Signed)
Raymond [name], Raymond [name] ([id number]) Visit Report for 03/08/2019 Debridement Details Patient Name: Date of Service: TRAE, BOVENZI 03/08/2019 8:00 AM Medical Record [id number] Patient Account Number: [id number] Date of Birth/Sex: 1960/10/19 (58 y.o. Male) Treating RN: Carlene Coria Primary Care Provider: SYSTEM, PCP Other Clinician: Referring Provider: Treating Provider/Extender:Robson, Sherre Scarlet, Johnna Acosta in Treatment: 2 Debridement Performed for Wound #1 Left Toe Great Assessment: Performed By: Physician Ricard Dillon., MD Debridement Type: Debridement Severity of Tissue Pre Fat layer exposed Debridement: Level of Consciousness (Pre- Awake and Alert procedure): Pre-procedure Verification/Time Out Taken: Yes - 09:00 Start Time: 09:00 Total Area Debrided (L x W): 0.4 (cm) x 0.5 (cm) = 0.2 (cm) Tissue and other material Viable, Non-Viable, Slough, Subcutaneous, Slough debrided: Level: Skin/Subcutaneous Tissue Debridement Description: Excisional Instrument: Curette Bleeding: Minimum Hemostasis Achieved: Pressure End Time: 09:03 Procedural Pain: 0 Post Procedural Pain: 0 Response to Treatment: Procedure was tolerated well Level of Consciousness Awake and Alert (Post-procedure): Post Debridement Measurements of Total Wound Length: (cm) 0.4 Width: (cm) 0.5 Depth: (cm) 0.5 Volume: (cm) 0.079 Character of Wound/Ulcer Post Improved Debridement: Severity of Tissue Post Debridement: Fat layer exposed Post Procedure Diagnosis Same as Pre-procedure Electronic Signature(s) Signed: 03/08/2019 5:42:33 PM By: Linton Ham MD Signed: 05/03/2019 3:01:15 PM By: Carlene Coria RN Entered By: Linton Ham on 03/08/2019 09:15:39 -------------------------------------------------------------------------------- HPI Details Patient Name: Date of Service: Raymond [name], Raymond [name] 03/08/2019 8:00 AM Medical Record [id number] Patient Account Number: [id number] Date of  Birth/Sex: Treating RN: 03-07-1961 (58 y.o. 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 For about 2 months he is been dealing with a wound on the tip of his left great toe. Followed by Dr. Jacqualyn Posey of podiatry. Initially this required debridement of necrotic tip of the area. It was noted to be concerning for osteomyelitis and deep review with Dr. Leigh Aurora plain x-ray shows quite significant bone destruction suggestive of chronic osteomyelitis. He was put on doxycycline by Dr. Jacqualyn Posey. Referred to Dr. Gwenlyn Found His noninvasive tests were done earlier this month. This showed an ABI on the right at 1.34 and a TBI of 0.51 with triphasic waveforms on the left this was 1.34/0.28 with monophasic waveforms. Dr. Gwenlyn Found I think was struggling with the idea of giving somebody with a transplanted kidney contrast media. The patient saw his nephrologist and apparently the nephrologist was supportive of him undergoing the test although I do not actually see this note. His nephrologist is in [place]. The patient went down for a second opinion to another podiatrist in [place] Dr. Skip Estimable. He saw Dr. Raenette Rover on 924. His x-ray also suggested osteomyelitis. The wound was debrided. A culture of this showed a large amount of enterococcus as well as Peptostreptococcus. I believe this was a swab culture. He was started on Augmentin the patient states swelling in the toe is gone down. Past medical history; diabetic neuropathy, end-stage renal disease start status post renal transplant, hypertension, hyperlipidemia, prior history of a right foot wound and right foot surgery 10/6; patient is been seen by  infectious disease Dr. Graylon Good on 9/29. He is starting IV antibiotics to get the PICC line placed today. Also had blood work sed rate and CRP at this point I do not have this result. I spoke to Dr. Gwenlyn Found last week. He has an  appointment with him on 10/13; this is likely going to progress towards angiography. Because of his transplanted kidney it will have to be done carefully with hydration careful attention to the amount of contrast. Dr. Gwenlyn Found is well aware of all these issues. He is originally referred here for consideration of hyperbaric oxygen. He certainly is a candidate for chronic refractory osteomyelitis. He had antibiotics prior to coming here I believe doxycycline 10/13; he has started IV antibiotics but at this point I am not sure which antibiotic it is. He was supposed to see Dr. Gwenlyn Found today by our notes however that is apparently been set back to 10/27 by review of epic. He was seeing Dr. Graylon Good today as well is Dr. Posey Pronto of podiatry with regards to a blister on the right foot which we are not looking at. We are using silver alginate to the toe. He is supposed to start hyperbarics on Monday, October 19 Electronic Signature(s) Signed: 03/08/2019 5:42:33 PM By: Linton Ham MD Entered By: Linton Ham on 03/08/2019 09:17:03 -------------------------------------------------------------------------------- Physical Exam Details Patient Name: Date of Service: Raymond [name], Raymond [name] 03/08/2019 8:00 AM Medical Record [id number] Patient Account Number: [id number] Date of Birth/Sex: Treating RN: 05/15/1961 (58 y.o. Male) Carlene Coria Primary Care Provider: SYSTEM, PCP Other Clinician: Referring Provider: Treating Provider/Extender:Robson, Sherre Scarlet, Johnna Acosta in Treatment: 2 Constitutional Patient is hypertensive.. Pulse regular and within target range for patient.[name] Respirations regular, non-labored and within target range.. Temperature is normal and within the target  range for the patient.[name] Appears in no distress. Eyes Conjunctivae clear. No discharge.no icterus. Cardiovascular Pedal pulses are not palpable at the dorsalis pedis. Notes Wound exam; tip of the left great toe debrided of debris and around the wound circumference. This still probes to bone. Hemostasis with direct pressure Electronic Signature(s) Signed: 03/08/2019 5:42:33 PM By: Linton Ham MD Entered By: Linton Ham on 03/08/2019 09:18:11 -------------------------------------------------------------------------------- Physician Orders Details Patient Name: Date of Service: Raymond [name], Raymond [name] 03/08/2019 8:00 AM Medical Record [id number] Patient Account Number: [id number] Date of Birth/Sex: Treating RN: 03-08-1961 (58 y.o. Male) Carlene Coria Primary Care Provider: SYSTEM, PCP Other Clinician: Referring Provider: Treating Provider/Extender:Robson, Sherre Scarlet, Johnna Acosta in Treatment: 2 Verbal / Phone Orders: No Diagnosis Coding ICD-10 Coding Code Description E11.621 Type 2 diabetes mellitus with foot ulcer L97.524 Non-pressure chronic ulcer of other part of left foot with necrosis of bone M86.672 Other chronic osteomyelitis, left ankle and foot E11.51 Type 2 diabetes mellitus with diabetic peripheral angiopathy without gangrene Follow-up Appointments Return Appointment in 1 week. Dressing Change Frequency Wound #1 Left Toe Great Change Dressing every other day. Wound Cleansing Wound #1 Left Toe Great May shower and wash wound with soap and water. - on days dressing is changed Primary Wound Dressing Wound #1 Left Toe Great Calcium Alginate with Silver - lightly pack undermining Secondary Dressing Wound #1 Left Toe Great Kerlix/Rolled Gauze - secure with tape Dry Gauze Off-Loading Open toe surgical shoe to: - left foot Hyperbaric Oxygen Therapy Evaluate for HBO Therapy Indication: - Wagner 3 ulcer left great toe If appropriate for treatment, begin  HBOT per protocol: 2.5 ATA for 90 Minutes with 2 Five (5) Minute Air Breaks Total Number of Treatments: - 40 One treatments per day (delivered Monday through Friday unless otherwise specified in Special Instructions below): Antihistamine 30 minutes prior to HBO Treatment, difficulty clearing ears. Finger stick Blood Glucose Pre- and Post- HBOT Treatment. Follow Hyperbaric Oxygen Glycemia Protocol GLYCEMIA INTERVENTIONS PROTOCOL PRE-HBO GLYCEMIA INTERVENTIONS ACTION INTERVENTION Obtain pre-HBO capillary blood 1 glucose (  ensure physician order is in chart). A. Notify HBO physician and await physician orders. 2 If result is 70 mg/dl or below: B. If the result meets the hospital definition of a critical result, follow hospital policy. A. Give patient an 8 ounce Glucerna Shake, an 8 ounce Ensure, or 8 ounces of a Glucerna/Ensure equivalent dietary supplement*. B. Wait 30 minutes. C. Retest patients capillary blood If result is 71 mg/dl to 130 mg/dl: glucose (CBG). D. If result greater than or equal to 110 mg/dl, proceed with HBO. If result less than 110 mg/dl, notify HBO physician and consider holding HBO. If result is 131 mg/dl to 249 mg/dl: A. Proceed with HBO. A. Notify HBO physician and await physician orders. B. It is recommended to hold HBO If result is 250 mg/dl or greater: and do blood/urine ketone testing. C. If the result meets the hospital definition of a critical result, follow hospital policy. POST-HBO GLYCEMIA INTERVENTIONS ACTION INTERVENTION Obtain post HBO capillary blood 1 glucose (ensure physician order is in chart). A. Notify HBO physician and await physician orders. 2 If result is 70 mg/dl or below: B. If the result meets the hospital definition of a critical result, follow hospital policy. A. Give patient an 8 ounce Glucerna Shake, an 8 ounce Ensure, or 8 ounces of a Glucerna/Ensure equivalent dietary supplement*. B. Wait 15 minutes for  symptoms of hypoglycemia (i.e. nervousness, anxiety, sweating, chills, If result is 71 mg/dl to 100 mg/dl: clamminess, irritability, confusion, tachycardia or dizziness). C. If patient asymptomatic, discharge patient. If patient symptomatic, repeat capillary blood glucose (CBG) and notify HBO physician. If result is 101 mg/dl to 249 mg/dl: A. Discharge patient. A. Notify HBO physician and await physician orders. B. It is recommended to do If result is 250 mg/dl or greater: blood/urine ketone testing. C. If the result meets the hospital definition of a critical result, follow hospital policy. *Juice or candies are NOT equivalent products. If patient refuses the Glucerna or Ensure, please consult the hospital dietitian for an appropriate substitute. Electronic Signature(s) Signed: 03/08/2019 5:42:33 PM By: Linton Ham MD Signed: 05/03/2019 3:01:15 PM By: Carlene Coria RN Entered By: Carlene Coria on 03/08/2019 08:14:49 -------------------------------------------------------------------------------- Problem List Details Patient Name: Date of Service: JASON, FRISBEE 03/08/2019 8:00 AM Medical Record [id number] Patient Account Number: [id number] Date of Birth/Sex: Treating RN: 11-09-1960 (58 y.o. Male) Carlene Coria Primary Care Provider: SYSTEM, PCP Other Clinician: Referring Provider: Treating Provider/Extender:Robson, Sherre Scarlet, Johnna Acosta in Treatment: 2 Active Problems ICD-10 Evaluated Encounter Code Description Active Date Today Diagnosis E11.621 Type 2 diabetes mellitus with foot ulcer 02/21/2019 No Yes L97.524 Non-pressure chronic ulcer of other part of left foot 02/21/2019 No Yes with necrosis of bone M86.672 Other chronic osteomyelitis, left ankle and foot 02/21/2019 No Yes E11.51 Type 2 diabetes mellitus with diabetic peripheral 02/21/2019 No Yes angiopathy without gangrene Inactive Problems Resolved Problems Electronic Signature(s) Signed:  03/08/2019 5:42:33 PM By: Linton Ham MD Entered By: Linton Ham on 03/08/2019 09:15:23 -------------------------------------------------------------------------------- Progress Note Details Patient Name: Date of Service: Raymond [name], Raymond [name] 03/08/2019 8:00 AM Medical Record [id number] Patient Account Number: [id number] Date of Birth/Sex: Treating RN: [birth year] (58 y.o. 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 For about 2 months he is been dealing with a wound on the tip of  his left great toe. Followed by Dr. Jacqualyn Posey of podiatry. Initially this required debridement of necrotic tip of the area. It was noted to be concerning for osteomyelitis and deep review with Dr. Leigh Aurora plain x-ray shows quite significant bone destruction suggestive of chronic osteomyelitis. He was put on doxycycline by Dr. Jacqualyn Posey. Referred to Dr. Gwenlyn Found His noninvasive tests were done earlier this month. This showed an ABI on the right at 1.34 and a TBI of 0.51 with triphasic waveforms on the left this was 1.34/0.28 with monophasic waveforms. Dr. Gwenlyn Found I think was struggling with the idea of giving somebody with a transplanted kidney contrast media. The patient saw his nephrologist and apparently the nephrologist was supportive of him undergoing the test although I do not actually see this note. His nephrologist is in [place]. The patient went down for a second opinion to another podiatrist in [place] Dr. Skip Estimable. He saw Dr. Raenette Rover on 924. His x-ray also suggested osteomyelitis. The wound was debrided. A culture of this showed a large amount of enterococcus as well as  Peptostreptococcus. I believe this was a swab culture. He was started on Augmentin the patient states swelling in the toe is gone down. Past medical history; diabetic neuropathy, end-stage renal disease start status post renal transplant, hypertension, hyperlipidemia, prior history of a right foot wound and right foot surgery 10/6; patient is been seen by infectious disease Dr. Graylon Good on 9/29. He is starting IV antibiotics to get the PICC line placed today. Also had blood work sed rate and CRP at this point I do not have this result. I spoke to Dr. Gwenlyn Found last week. He has an appointment with him on 10/13; this is likely going to progress towards angiography. Because of his transplanted kidney it will have to be done carefully with hydration careful attention to the amount of contrast. Dr. Gwenlyn Found is well aware of all these issues. He is originally referred here for consideration of hyperbaric oxygen. He certainly is a candidate for chronic refractory osteomyelitis. He had antibiotics prior to coming here I believe doxycycline 10/13; he has started IV antibiotics but at this point I am not sure which antibiotic it is. He was supposed to see Dr. Gwenlyn Found today by our notes however that is apparently been set back to 10/27 by review of epic. He was seeing Dr. Graylon Good today as well is Dr. Posey Pronto of podiatry with regards to a blister on the right foot which we are not looking at. We are using silver alginate to the toe. He is supposed to start hyperbarics on Monday, October 19 Objective Constitutional Patient is hypertensive.. Pulse regular and within target range for patient.[name] Respirations regular, non-labored and within target range.. Temperature is normal and within the target range for the patient.[name] Appears in no distress. Vitals Time Taken: 8:29 AM, Height: 69 in, Weight: 226 lbs, BMI: 33.4, Temperature: 98.2 F, Pulse: 67 bpm, Respiratory Rate: 18 breaths/min, Blood Pressure: 192/92  mmHg. Eyes Conjunctivae clear. No discharge.no icterus. Cardiovascular Pedal pulses are not palpable at the dorsalis pedis. General Notes: Wound exam; tip of the left great toe debrided of debris and around the wound circumference. This still probes to bone. Hemostasis with direct pressure Integumentary (Hair, Skin) Wound #1 status is Open. Original cause of wound was Blister. The wound is located on the Left Toe Great. The wound measures 0.4cm length x 0.5cm width x 0.5cm depth; 0.157cm^2 area and 0.079cm^3 volume. There is bone and Fat Layer (Subcutaneous Tissue) Exposed exposed. There is  no tunneling or undermining noted. There is a medium amount of serosanguineous drainage noted. The wound margin is well defined and not attached to the wound base. There is no granulation within the wound bed. There is a large (67-100%) amount of necrotic tissue within the wound bed including Adherent Slough. 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 There was a Excisional Skin/Subcutaneous Tissue Debridement with a total area of 0.2 sq cm performed by Ricard Dillon., MD. With the following instrument(s): Curette to remove Viable and Non-Viable tissue/material. Material removed includes Subcutaneous Tissue and Slough and. No specimens were taken. A time out was conducted at 09:00, prior to the start of the procedure. A Minimum amount of bleeding was controlled with Pressure. The procedure was tolerated well with a pain level of 0 throughout and a pain level of 0 following the  procedure. Post Debridement Measurements: 0.4cm length x 0.5cm width x 0.5cm depth; 0.079cm^3 volume. Character of Wound/Ulcer Post Debridement is improved. Severity of Tissue Post Debridement is: Fat layer exposed. Post procedure Diagnosis Wound #1: Same as Pre-Procedure Plan Follow-up Appointments: Return Appointment in 1 week. Dressing Change Frequency: Wound #1 Left Toe Great: Change Dressing every other day. Wound Cleansing: Wound #1 Left Toe Great: May shower and wash wound with soap and water. - on days dressing is changed Primary Wound Dressing: Wound #1 Left Toe Great: Calcium Alginate with Silver - lightly pack undermining Secondary Dressing: Wound #1 Left Toe Great: Kerlix/Rolled Gauze - secure with tape Dry Gauze Off-Loading: Open toe surgical shoe to: - left foot Hyperbaric Oxygen Therapy: Evaluate for HBO Therapy Indication: - Wagner 3 ulcer left great toe If appropriate for treatment, begin HBOT per protocol: 2.5 ATA for 90 Minutes with 2 Five (5) Minute Air Breaks Total Number of Treatments: - 40 One treatments per day (delivered Monday through Friday unless otherwise specified in Special Instructions below): Antihistamine 30 minutes prior to HBO Treatment, difficulty clearing ears. Finger stick Blood Glucose Pre- and Post- HBOT Treatment. Follow Hyperbaric Oxygen Glycemia Protocol 1. Continue with silver alginate to the left great toe 2. I will need to check with Dr. Crissie Figures notes to see what IV antibiotic he is taking this started last week 3. Unfortunately the needed vascular evaluation by Dr. Gwenlyn Found [angiography] is now been pushed back to 10/27 Electronic Signature(s) Signed: 03/08/2019 5:42:33 PM By: Linton Ham MD Entered By: Linton Ham on 03/08/2019 09:19:06 -------------------------------------------------------------------------------- [facility] Details Patient Name: Date of Service: Raymond [name], Raymond [name] 03/08/2019 Medical Record  [id number] Patient Account Number: [id number] Date of Birth/Sex: Treating RN: Jun 16, 1960 (58 y.o. Male) Carlene Coria Primary Care Provider: SYSTEM, PCP Other Clinician: Referring Provider: Treating Provider/Extender:Robson, Sherre Scarlet, Johnna Acosta in Treatment: 2 Diagnosis Coding ICD-10 Codes Code Description E11.621 Type 2 diabetes mellitus with foot ulcer L97.524 Non-pressure chronic ulcer of other part of left foot with necrosis of bone M86.672 Other chronic osteomyelitis, left ankle and foot E11.51 Type 2 diabetes mellitus with diabetic peripheral angiopathy without gangrene Facility Procedures CPT4 Code Description: [id number] - DEB SUBQ TISSUE 20 SQ CM/< ICD-10 Diagnosis Description L97.524 Non-pressure chronic ulcer of other part of left foot  wit E11.621 Type 2 diabetes mellitus with foot ulcer Modifier: h necrosis of Quantity: 1 bone Physician Procedures Electronic Signature(s) Signed: 03/08/2019 5:42:33 PM By: Linton Ham MD Entered By: Linton Ham on 03/08/2019 09:19:21

## 2019-05-03 NOTE — Progress Notes (Signed)
NEDIM, OKI ([id number]) Visit Report for 03/08/2019 Arrival Information Details Patient Name: Date of Service: Raymond [name], Raymond [name] 03/08/2019 8:00 AM Medical Record [id number] Patient Account Number: [id number] Date of Birth/Sex: Treating RN: Apr 25, 1961 (58 y.o. Male) Levan Hurst Primary Care Takeisha Cianci: SYSTEM, PCP Other Clinician: Referring Martine Trageser: Treating Bayan Kushnir/Extender:Robson, Sherre Scarlet, Johnna Acosta in Treatment: 2 Visit Information History Since Last Visit Added or deleted any medications: No Patient Arrived: Ambulatory Any new allergies or adverse reactions: No Arrival Time: 08:28 Had a fall or experienced change in No Accompanied By: alone activities of daily living that may affect Transfer Assistance: None risk of falls: Patient Identification Verified: Yes Signs or symptoms of abuse/neglect since last No Secondary Verification Process Completed: Yes visito Patient Requires Transmission-Based No Hospitalized since last visit: No Precautions: Implantable device outside of the clinic excluding No Patient Has Alerts: Yes cellular tissue based products placed in the center Patient Alerts: Left 0.51 since last visit: Right 0.28 Has Dressing in Place as Prescribed: Yes Pain Present Now: No Electronic Signature(s) Signed: 03/08/2019 5:52:09 PM By: Levan Hurst RN, BSN Entered By: Levan Hurst on 03/08/2019 08:28:26 -------------------------------------------------------------------------------- Encounter Discharge Information Details Patient Name: Date of Service: Raymond [name], Raymond [name] 03/08/2019 8:00 AM Medical Record [id number] Patient Account Number: [id number] Date of Birth/Sex: Treating RN: 08-28-1960 (58 y.o. Male) Carlene Coria Primary Care Aubreana Cornacchia: SYSTEM, PCP Other Clinician: Referring Felis Quillin: Treating Enna Warwick/Extender:Robson, Sherre Scarlet, Johnna Acosta in Treatment: 2 Encounter Discharge Information Items Post  Procedure Vitals Discharge Condition: Stable Temperature (F): 98.2 Ambulatory Status: Ambulatory Pulse (bpm): 67 Discharge Destination: Home Respiratory Rate (breaths/min): 18 Transportation: Private Auto Blood Pressure (mmHg): 192/92 Accompanied By: self Schedule Follow-up Appointment: Yes Clinical Summary of Care: Patient Declined Electronic Signature(s) Signed: 05/03/2019 3:01:15 PM By: Carlene Coria RN Entered By: Carlene Coria on 03/08/2019 09:12:30 -------------------------------------------------------------------------------- Lower Extremity Assessment Details Patient Name: Date of Service: Raymond [name], Raymond [name] 03/08/2019 8:00 AM Medical Record [id number] Patient Account Number: [id number] Date of Birth/Sex: Treating RN: Dec 09, 1960 (58 y.o. Male) Levan Hurst Primary Care Desteni Piscopo: SYSTEM, PCP Other Clinician: Referring Arrick Dutton: Treating Kriya Westra/Extender:Robson, Sherre Scarlet, Johnna Acosta in Treatment: 2 Edema Assessment Assessed: [Left: No] [Right: No] Edema: [Left: N] [Right: o] Calf Left: Right: Point of Measurement: cm From Medial Instep 37.5 cm cm Ankle Left: Right: Point of Measurement: cm From Medial Instep 23.5 cm cm Vascular Assessment Pulses: Dorsalis Pedis Palpable: [Left:Yes] Electronic Signature(s) Signed: 03/08/2019 5:52:09 PM By: Levan Hurst RN, BSN Entered By: Levan Hurst on 03/08/2019 08:35:35 -------------------------------------------------------------------------------- Multi Wound Chart Details Patient Name: Date of Service: Raymond [name], Raymond [name] 03/08/2019 8:00 AM Medical Record [id number] Patient Account Number: [id number] Date of Birth/Sex: Treating RN: 25-Aug-1960 (58 y.o. Male) Carlene Coria Primary Care Asta Corbridge: SYSTEM, PCP Other Clinician: Referring Oris Staffieri: Treating Jonan Seufert/Extender:Robson, Sherre Scarlet, Johnna Acosta in Treatment: 2 Vital Signs Height(in): 9 Pulse(bpm): 77 Weight(lbs): 226 Blood  Pressure(mmHg): 192/92 Body Mass Index(BMI): 33 Temperature(F): 98.2 Respiratory 18 Rate(breaths/min): Photos: [1:No Photos] [N/A:N/A] Wound Location: [1:Left Toe Great] [N/A:N/A] Wounding Event: [1:Blister] [N/A:N/A] Primary Etiology: [1:Diabetic Wound/Ulcer of the N/A Lower Extremity] Comorbid History: [1:Hypertension, Type II Diabetes, Osteomyelitis] [N/A:N/A] Date Acquired: [1:12/25/2018] [N/A:N/A] Weeks of Treatment: [1:2] [N/A:N/A] Wound Status: [1:Open] [N/A:N/A] Measurements L x W x D 0.4x0.5x0.5 [N/A:N/A] (cm) Area (cm) : [1:0.157] [N/A:N/A] Volume (cm) : [1:0.079] [N/A:N/A] % Reduction in Area: [1:-33.10%] [N/A:N/A] % Reduction in Volume: 52.10% [N/A:N/A] Classification: [1:Grade 3] [N/A:N/A] Exudate Amount: [1:Medium] [N/A:N/A] Exudate Type: [1:Serosanguineous] [N/A:N/A] Exudate Color: [1:red, brown] [N/A:N/A] Wound Margin: [1:Well defined, not attached N/A] Granulation Amount: [1:None Present (0%)] [N/A:N/A] Necrotic Amount: [1:Large (  67-100%)] [N/A:N/A] Exposed Structures: [1:Fat Layer (Subcutaneous N/A Tissue) Exposed: Yes Bone: Yes Fascia: No Tendon: No Muscle: No Joint: No] Epithelialization: [1:Medium (34-66%)] [N/A:N/A] Debridement: [1:Debridement - Excisional N/A] Pre-procedure [1:09:00] [N/A:N/A] Verification/Time Out Taken: Tissue Debrided: [1:Subcutaneous, Slough] [N/A:N/A] Level: [1:Skin/Subcutaneous Tissue N/A] Debridement Area (sq cm):0.2 [N/A:N/A] Instrument: [1:Curette] [N/A:N/A] Bleeding: [1:Minimum] [N/A:N/A] Hemostasis Achieved: [1:Pressure] [N/A:N/A] Procedural Pain: [1:0] [N/A:N/A] Post Procedural Pain: [1:0] [N/A:N/A] Debridement Treatment Procedure was tolerated [N/A:N/A] Response: [1:well] Post Debridement [1:0.4x0.5x0.5] [N/A:N/A] Measurements L x W x D (cm) Post Debridement [1:0.079] [N/A:N/A] Volume: (cm) Procedures Performed: Debridement [N/A:N/A] Treatment Notes Wound #1 (Left Toe Great) 1. Cleanse With Wound Cleanser 3.  Primary Dressing Applied Calcium Alginate Ag 4. Secondary Dressing Dry Gauze Notes netting Electronic Signature(s) Signed: 03/08/2019 5:42:33 PM By: Linton Ham MD Signed: 05/03/2019 3:01:15 PM By: Carlene Coria RN Entered By: Linton Ham on 03/08/2019 09:15:30 -------------------------------------------------------------------------------- Multi-Disciplinary Care Plan Details Patient Name: Date of Service: Raymond [name], Raymond [name] 03/08/2019 8:00 AM Medical Record [id number] Patient Account Number: [id number] Date of Birth/Sex: Treating RN: [birth year] (58 y.o. Male) Carlene Coria Primary Care Avryl Roehm: SYSTEM, PCP Other Clinician: Referring Bradie Lacock: Treating Navya Timmons/Extender:Robson, Sherre Scarlet, Johnna Acosta in Treatment: 2 Active Inactive Nutrition Nursing Diagnoses: Impaired glucose control: actual or potential Potential for alteratiion in Nutrition/Potential for imbalanced nutrition Goals: Patient/caregiver agrees to and verbalizes understanding of need to use nutritional supplements and/or vitamins as prescribed Date Initiated: 02/21/2019 Target Resolution Date: 03/25/2019 Goal Status: Active Patient/caregiver will maintain therapeutic glucose control Date Initiated: 02/21/2019 Target Resolution Date: 03/25/2019 Goal Status: Active Interventions: Assess HgA1c results as ordered upon admission and as needed Assess patient nutrition upon admission and as needed per policy Provide education on elevated blood sugars and impact on wound healing Provide education on nutrition Treatment Activities: Education provided on Nutrition : 02/21/2019 Notes: Osteomyelitis Nursing Diagnoses: Infection: osteomyelitis Knowledge deficit related to disease process and management Goals: Patient/caregiver will verbalize understanding of disease process and disease management Date Initiated: 02/21/2019 Target Resolution Date: 03/25/2019 Goal Status: Active Patient's  osteomyelitis will resolve Date Initiated: 02/21/2019 Target Resolution Date: 03/25/2019 Goal Status: Active Interventions: Assess for signs and symptoms of osteomyelitis resolution every visit Provide education on osteomyelitis Screen for HBO Notes: Wound/Skin Impairment Nursing Diagnoses: Impaired tissue integrity Knowledge deficit related to ulceration/compromised skin integrity Goals: Patient/caregiver will verbalize understanding of skin care regimen Date Initiated: 02/21/2019 Target Resolution Date: 03/25/2019 Goal Status: Active Ulcer/skin breakdown will have a volume reduction of 30% by week 4 Date Initiated: 02/21/2019 Target Resolution Date: 03/25/2019 Goal Status: Active Interventions: Assess patient/caregiver ability to obtain necessary supplies Assess patient/caregiver ability to perform ulcer/skin care regimen upon admission and as needed Assess ulceration(s) every visit Provide education on ulcer and skin care Notes: Electronic Signature(s) Signed: 05/03/2019 3:01:15 PM By: Carlene Coria RN Entered By: Carlene Coria on 03/08/2019 08:14:58 -------------------------------------------------------------------------------- Pain Assessment Details Patient Name: Date of Service: Raymond [name], Raymond [name] 03/08/2019 8:00 AM Medical Record [id number] Patient Account Number: [id number] Date of Birth/Sex: Treating RN: 1960-06-10 (58 y.o. Male) Levan Hurst Primary Care Gaile Allmon: SYSTEM, PCP Other Clinician: Referring Kierria Feigenbaum: Treating Lonya Johannesen/Extender:Robson, Sherre Scarlet, Johnna Acosta in Treatment: 2 Active Problems Location of Pain Severity and Description of Pain Patient Has Paino No Site Locations Pain Management and Medication Current Pain Management: Electronic Signature(s) Signed: 03/08/2019 5:52:09 PM By: Levan Hurst RN, BSN Entered By: Levan Hurst on 03/08/2019  08:28:37 -------------------------------------------------------------------------------- Patient/Caregiver Education Details Patient Name: Raymond [name] 10/13/2020andnbsp8:00 Date of Service: AM Medical Record [id number] Number: Patient Account Number: [id number] Treating RN: 20-Jun-1960 (57 y.o. Epps,  Morey Hummingbird Date of Birth/Gender: Male) Other Clinician: [place]all, PCP Linton Ham Physician: Physician/Extender: Referring Physician: Hulda Marin in Treatment: 2 Education Assessment Education Provided To: Patient Education Topics Provided Elevated Blood Sugar/ Impact on Healing: Methods: Explain/Verbal Responses: State content correctly Infection: Methods: Explain/Verbal Responses: State content correctly Electronic Signature(s) Signed: 05/03/2019 3:01:15 PM By: Carlene Coria RN Entered By: Carlene Coria on 03/08/2019 08:15:30 -------------------------------------------------------------------------------- Wound Assessment Details Patient Name: Date of Service: Raymond [name], Raymond [name] 03/08/2019 8:00 AM Medical Record [id number] Patient Account Number: [id number] Date of Birth/Sex: Treating RN: 07-Nov-1960 (58 y.o. Male) Levan Hurst Primary Care Marjorie Lussier: SYSTEM, PCP Other Clinician: Referring Veronica Fretz: Treating Yoana Staib/Extender:Robson, Sherre Scarlet, Johnna Acosta in Treatment: 2 Wound Status Wound Number: 1 Primary Diabetic Wound/Ulcer of the Lower Etiology: Extremity Wound Location: Left Toe Great Wound Status: Open Wounding Event: Blister Comorbid Hypertension, Type II Diabetes, Date Acquired: 12/25/2018 History: Osteomyelitis Weeks Of Treatment: 2 Clustered Wound: No Photos Wound Measurements Length: (cm) 0.4 Width: (cm) 0.5 Depth: (cm) 0.5 Area: (cm) 0.157 Volume: (cm) 0.079 Wound Description Classification: Grade 3 Wound Margin: Well defined, not attached Exudate Amount: Medium Exudate Type:  Serosanguineous Exudate Color: red, brown Wound Bed Granulation Amount: None Present (0%) Necrotic Amount: Large (67-100%) Necrotic Quality: Adherent Slough After Cleansing: No rino Yes Exposed Structure osed: No (Subcutaneous Tissue) Exposed: Yes osed: No osed: No sed: No ed: Yes % Reduction in Area: -33.1% % Reduction in Volume: 52.1% Epithelialization: Medium (34-66%) Tunneling: No Undermining: No Foul Odor Slough/Fib Fascia Exp Fat Layer Tendon Exp Muscle Exp Joint Expo Bone Expos Treatment Notes Wound #1 (Left Toe Great) 1. Cleanse With Wound Cleanser 3. Primary Dressing Applied Calcium Alginate Ag 4. Secondary Dressing Dry Gauze Notes netting Electronic Signature(s) Signed: 03/11/2019 6:00:55 PM By: Levan Hurst RN, BSN Signed: 03/30/2019 4:01:42 PM By: Mikeal Hawthorne EMT/HBOT Previous Signature: 03/08/2019 5:52:09 PM Version By: Levan Hurst RN, BSN Entered By: Mikeal Hawthorne on 03/09/2019 09:04:54 -------------------------------------------------------------------------------- Vitals Details Patient Name: Date of Service: Raymond [name], Raymond [name] 03/08/2019 8:00 AM Medical Record [id number] Patient Account Number: [id number] Date of Birth/Sex: Treating RN: 04-14-1961 (58 y.o. Male) Levan Hurst Primary Care Jodel Mayhall: SYSTEM, PCP Other Clinician: Referring Zakye Baby: Treating Gustavia Carie/Extender:Robson, Sherre Scarlet, Johnna Acosta in Treatment: 2 Vital Signs Time Taken: 08:29 Temperature (F): 98.2 Height (in): 69 Pulse (bpm): 67 Weight (lbs): 226 Respiratory Rate (breaths/min): 18 Body Mass Index (BMI): 33.4 Blood Pressure (mmHg): 192/92 Reference Range: 80 - 120 mg / dl Electronic Signature(s) Signed: 03/08/2019 5:52:09 PM By: Levan Hurst RN, BSN Entered By: Levan Hurst on 03/08/2019 08:31:30

## 2019-05-04 ENCOUNTER — Other Ambulatory Visit: Payer: Self-pay

## 2019-05-04 ENCOUNTER — Ambulatory Visit (INDEPENDENT_AMBULATORY_CARE_PROVIDER_SITE_OTHER): Payer: BC Managed Care – PPO | Admitting: Internal Medicine

## 2019-05-04 VITALS — BP 163/89 | HR 66 | Temp 98.9°F | Ht 69.0 in | Wt 227.0 lb

## 2019-05-04 DIAGNOSIS — M869 Osteomyelitis, unspecified: Secondary | ICD-10-CM

## 2019-05-04 NOTE — Progress Notes (Signed)
RFV: follow up with digital osteo  Patient ID: Raymond [name], male   DOB: 09/14/1960, 58 y.o.   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 Found to have osteomyelitis and debated to do amputation vs. Wound care. He previously had been on doxycycline without improvement. Micro results showing enterococcus. He is nearly done with  8 wks of daptomycin. his  Left great toe looks improved. Still undergoing vascular evaluation  Outpatient Encounter Medications as of 05/04/2019  Medication Sig   amLODipine (NORVASC) 5 MG tablet Take by mouth.   atorvastatin (LIPITOR) 40 MG tablet Take by mouth.   azaTHIOprine (IMURAN) 50 MG tablet    Blood Glucose Monitoring Suppl (ACCU-CHEK GUIDE ME) w/Device KIT 1 Piece by Does not apply route as directed.   cloNIDine (CATAPRES - DOSED IN MG/24 HR) 0.3 mg/24hr patch Place 0.3 mg onto the skin once a week.   cyclobenzaprine (FLEXERIL) 10 MG tablet Take 1 tablet (10 mg total) by mouth 2 (two) times daily as needed for muscle spasms.   doxazosin (CARDURA) 4 MG tablet Take by mouth.   glipiZIDE (GLUCOTROL XL) 5 MG 24 hr tablet Take by mouth.   glucose blood (ACCU-CHEK GUIDE) test strip Use as instructed 4 x daily. E11.65   hydrALAZINE (APRESOLINE) 25 MG tablet Take 25 mg by mouth 2 (two) times daily.   Insulin Glargine (LANTUS SOLOSTAR) 100 UNIT/ML Solostar Pen INJECT 50 UNITS SUBCUTANEOUSLY AT BEDTIME   Insulin Pen Needle (B-D ULTRAFINE III SHORT PEN) 31G X 8 MM MISC 1 each by Does not apply route as directed.   Multiple Vitamin (MULTIVITAMIN) capsule Take 1 capsule by mouth daily.   sodium bicarbonate 650 MG tablet Take 1,300 mg by mouth 3 (three) times daily.   tacrolimus (PROGRAF) 1 MG capsule Take 2 mg by mouth 2 (two) times daily. 2 capsules every morning, 2 capsules in the  evening   Vitamin D, Ergocalciferol, (DRISDOL) 50000 units CAPS capsule Take 50,000 Units by mouth every 30 (thirty) days.   amoxicillin-clavulanate (AUGMENTIN) 500-125 MG tablet Take 1 tablet by mouth 2 (two) times daily.   doxycycline (VIBRA-TABS) 100 MG tablet Take 1 tablet (100 mg total) by mouth 2 (two) times daily. (Patient not taking: Reported on 05/04/2019)   HYDROcodone-acetaminophen (NORCO/VICODIN) 5-325 MG tablet TAKE 1 TABLET BY MOUTH EVERY 4 HOURS AS NEEDED FOR SHOULDER PAIN   hydrOXYzine (ATARAX/VISTARIL) 25 MG tablet Take 25 mg by mouth 2 (two) times daily as needed.   mupirocin ointment (BACTROBAN) 2 % Apply 1 application topically 2 (two) times daily. (Patient not taking: Reported on 05/04/2019)   omeprazole (PRILOSEC) 20 MG capsule Take by mouth.   oxyCODONE (ROXICODONE) 5 MG immediate release tablet Take 1 tablet (5 mg total) by mouth every 8 (eight) hours as needed for up to 12 doses. (Patient not taking: Reported on 05/04/2019)   Rivaroxaban (XARELTO) 15 MG TABS tablet Take 1 tablet (15 mg total) by mouth 2 (two) times daily with a meal for 21 days.   rivaroxaban (XARELTO) 20 MG TABS tablet Take 1 tablet (20 mg total) by mouth daily with supper.   Facility-Administered Encounter Medications as of 05/04/2019  Medication   sodium chloride flush (NS) 0.9 % injection 3 mL     Patient Active Problem List   Diagnosis Date Noted  Critical lower limb ischemia 02/02/2019   Personal history of noncompliance with medical treatment, presenting hazards to health 10/07/2018   Critical limb ischemia with history of revascularization of same extremity 07/27/2018   Mixed hyperlipidemia 02/19/2018   Class 1 obesity due to excess calories with serious comorbidity and body mass index (BMI) of 31.0 to 31.9 in adult 02/19/2018   History of renal transplant 02/19/2018   Bilateral impacted cerumen 08/31/2017   Conductive hearing loss, bilateral 08/31/2017   Nuclear sclerotic  cataract of right eye 10/07/2016   Pseudophakia of left eye 10/07/2016   Hypertension due to endocrine disorder 03/04/2016   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 ([place]) 03/04/2016   Renal failure 03/04/2016   Acute kidney injury ([place]) 12/10/2015   Immunosuppression ([place]) 12/10/2015   Nausea vomiting and diarrhea 12/10/2015   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) ([place]) 12/10/2015   Essential hypertension, benign 12/10/2015   ARF (acute renal failure) ([place]) 12/10/2015   Pancytopenia ([place]) 12/10/2015   Encounter for screening colonoscopy 11/15/2014   GERD (gastroesophageal reflux disease) 11/15/2014     Health Maintenance Due  Topic Date Due   Hepatitis C Screening  [birth year]   PNEUMOCOCCAL POLYSACCHARIDE VACCINE AGE 22-64 HIGH RISK  10/21/1962   FOOT EXAM  10/21/1970   OPHTHALMOLOGY EXAM  10/21/1970   HIV Screening  10/21/1975   TETANUS/TDAP  10/21/1979   HEMOGLOBIN A1C  01/26/2019    Social History   Tobacco Use   Smoking status: Never Smoker   Smokeless tobacco: Never Used  Substance Use Topics   Alcohol use: No    Alcohol/week: 0.0 standard drinks   Drug use: No   Review of Systems Review of Systems  Constitutional: Negative for fever, chills, diaphoresis, activity change, appetite change, fatigue and unexpected weight change.  HENT: Negative for congestion, sore throat, rhinorrhea, sneezing, trouble swallowing and sinus pressure.  Eyes: Negative for photophobia and visual disturbance.  Respiratory: Negative for cough, chest tightness, shortness of breath, wheezing and stridor.  Cardiovascular: Negative for chest pain, palpitations and leg swelling.  Gastrointestinal: Negative for nausea, vomiting, abdominal pain, diarrhea, constipation, blood in stool, abdominal distention and anal bleeding.  Genitourinary: Negative for dysuria, hematuria,  flank pain and difficulty urinating.  Musculoskeletal: Negative for myalgias, back pain, joint swelling, arthralgias and gait problem.  Skin: Negative for color change, pallor, rash and wound.  Neurological: Negative for dizziness, tremors, weakness and light-headedness.  Hematological: Negative for adenopathy. Does not bruise/bleed easily.  Psychiatric/Behavioral: Negative for behavioral problems, confusion, sleep disturbance, dysphoric mood, decreased concentration and agitation.    Physical Exam   BP (!) 163/89    Pulse 66    Temp 98.9 F (37.2 C) (Oral)    Ht '5\' 9"'  (1.753 m)    Wt 227 lb (103 kg)    SpO2 100%    BMI 33.52 kg/m    gen -= a xo by 3 in nad Ext= left great toe slightly larger than right, slightly hyperpigmented but improved no drainage Ext: good pedal pulses CBC Lab Results  Component Value Date   WBC 3.1 (L) 04/01/2019   RBC 4.77 04/01/2019   HGB 12.6 (L) 04/01/2019   HCT 39.3 04/01/2019   PLT 114 (L) 04/01/2019   MCV 82 04/01/2019   MCH 26.4 (L) 04/01/2019   MCHC 32.1 04/01/2019   RDW 13.3 04/01/2019   LYMPHSABS 1.0 03/22/2019   MONOABS 0.3 03/22/2019   EOSABS  0.1 03/22/2019    BMET Lab Results  Component Value Date   NA 136 05/02/2019   K 5.2 05/02/2019   CL 102 05/02/2019   CO2 20 05/02/2019   GLUCOSE 357 (H) 05/02/2019   BUN 27 (H) 05/02/2019   CREATININE 1.38 (H) 05/02/2019   CALCIUM 9.6 05/02/2019   GFRNONAA 56 (L) 05/02/2019   GFRAA 65 05/02/2019    Lab Results  Component Value Date   ESRSEDRATE 50 (H) 05/04/2019   Lab Results  Component Value Date   CRP 38.3 (H) 05/04/2019     Assessment and Plan  Dfu/osteomyelitis = recommend to have his picc line pulled. Will check labs to see if still elevated to see if need further abtx. Can consider giving course of linezolid x 2 wk to see if improves osteo

## 2019-05-05 ENCOUNTER — Encounter (HOSPITAL_BASED_OUTPATIENT_CLINIC_OR_DEPARTMENT_OTHER): Payer: BC Managed Care – PPO | Admitting: Internal Medicine

## 2019-05-05 LAB — SEDIMENTATION RATE: Sed Rate: 50 mm/h — ABNORMAL HIGH (ref 0–20)

## 2019-05-05 LAB — C-REACTIVE PROTEIN: CRP: 38.3 mg/L — ABNORMAL HIGH (ref <8.0)

## 2019-05-06 ENCOUNTER — Telehealth: Payer: Self-pay

## 2019-05-06 ENCOUNTER — Encounter (HOSPITAL_BASED_OUTPATIENT_CLINIC_OR_DEPARTMENT_OTHER): Payer: BC Managed Care – PPO | Admitting: Internal Medicine

## 2019-05-06 NOTE — Telephone Encounter (Signed)
LVM with Raymond [name] at [facility] to make aware that patient is scheduled for tunnel picc line removal on 05/10/19 and patient is aware. Patient aware to continue to flush picc line until removed. Antibiotics completed.

## 2019-05-09 ENCOUNTER — Encounter (HOSPITAL_BASED_OUTPATIENT_CLINIC_OR_DEPARTMENT_OTHER): Payer: BC Managed Care – PPO | Admitting: Internal Medicine

## 2019-05-10 ENCOUNTER — Other Ambulatory Visit: Payer: Self-pay

## 2019-05-10 ENCOUNTER — Encounter (HOSPITAL_BASED_OUTPATIENT_CLINIC_OR_DEPARTMENT_OTHER): Payer: BC Managed Care – PPO | Admitting: Internal Medicine

## 2019-05-10 ENCOUNTER — Ambulatory Visit (HOSPITAL_COMMUNITY)
Admission: RE | Admit: 2019-05-10 | Discharge: 2019-05-10 | Disposition: A | Discharge status: Home / Self Care | Payer: BC Managed Care – PPO | Source: Ambulatory Visit | Attending: Internal Medicine | Admitting: Internal Medicine

## 2019-05-10 DIAGNOSIS — N186 End stage renal disease: Secondary | ICD-10-CM | POA: Diagnosis not present

## 2019-05-10 DIAGNOSIS — Z4901 Encounter for fitting and adjustment of extracorporeal dialysis catheter: Secondary | ICD-10-CM | POA: Insufficient documentation

## 2019-05-10 DIAGNOSIS — N179 Acute kidney failure, unspecified: Secondary | ICD-10-CM | POA: Diagnosis not present

## 2019-05-10 DIAGNOSIS — M869 Osteomyelitis, unspecified: Secondary | ICD-10-CM | POA: Insufficient documentation

## 2019-05-10 DIAGNOSIS — K219 Gastro-esophageal reflux disease without esophagitis: Secondary | ICD-10-CM | POA: Diagnosis not present

## 2019-05-10 DIAGNOSIS — H9193 Unspecified hearing loss, bilateral: Secondary | ICD-10-CM | POA: Diagnosis not present

## 2019-05-10 DIAGNOSIS — E669 Obesity, unspecified: Secondary | ICD-10-CM | POA: Diagnosis not present

## 2019-05-10 DIAGNOSIS — Z6833 Body mass index (BMI) 33.0-33.9, adult: Secondary | ICD-10-CM | POA: Insufficient documentation

## 2019-05-10 DIAGNOSIS — Z940 Kidney transplant status: Secondary | ICD-10-CM | POA: Diagnosis not present

## 2019-05-10 DIAGNOSIS — E113511 Type 2 diabetes mellitus with proliferative diabetic retinopathy with macular edema, right eye: Secondary | ICD-10-CM | POA: Insufficient documentation

## 2019-05-10 DIAGNOSIS — E1122 Type 2 diabetes mellitus with diabetic chronic kidney disease: Secondary | ICD-10-CM | POA: Insufficient documentation

## 2019-05-10 DIAGNOSIS — Z794 Long term (current) use of insulin: Secondary | ICD-10-CM | POA: Diagnosis not present

## 2019-05-10 DIAGNOSIS — I120 Hypertensive chronic kidney disease with stage 5 chronic kidney disease or end stage renal disease: Secondary | ICD-10-CM | POA: Diagnosis not present

## 2019-05-10 DIAGNOSIS — Z7901 Long term (current) use of anticoagulants: Secondary | ICD-10-CM | POA: Insufficient documentation

## 2019-05-10 DIAGNOSIS — Z79899 Other long term (current) drug therapy: Secondary | ICD-10-CM | POA: Insufficient documentation

## 2019-05-10 HISTORY — PX: IR REMOVAL TUN CV CATH W/O FL: IMG2289

## 2019-05-10 MED ORDER — CHLORHEXIDINE GLUCONATE 4 % EX LIQD
CUTANEOUS | Status: AC
  Filled 2019-05-10: qty 15

## 2019-05-10 NOTE — Procedures (Signed)
Successful removal of tunneled (R)IJ CVC No complications  Ascencion Dike PA-C Interventional Radiology 05/10/2019 8:57 AM

## 2019-05-12 ENCOUNTER — Encounter (HOSPITAL_BASED_OUTPATIENT_CLINIC_OR_DEPARTMENT_OTHER): Payer: BC Managed Care – PPO | Admitting: Internal Medicine

## 2019-05-13 ENCOUNTER — Encounter (HOSPITAL_BASED_OUTPATIENT_CLINIC_OR_DEPARTMENT_OTHER): Payer: BC Managed Care – PPO | Admitting: Internal Medicine

## 2019-05-24 ENCOUNTER — Telehealth: Payer: Self-pay | Admitting: *Deleted

## 2019-05-25 ENCOUNTER — Encounter: Payer: Self-pay | Admitting: Podiatry

## 2019-05-25 ENCOUNTER — Ambulatory Visit (INDEPENDENT_AMBULATORY_CARE_PROVIDER_SITE_OTHER): Payer: BC Managed Care – PPO | Admitting: Podiatry

## 2019-05-25 ENCOUNTER — Other Ambulatory Visit: Payer: Self-pay

## 2019-05-25 DIAGNOSIS — M79675 Pain in left toe(s): Secondary | ICD-10-CM

## 2019-05-25 DIAGNOSIS — M79674 Pain in right toe(s): Secondary | ICD-10-CM

## 2019-05-25 DIAGNOSIS — E0843 Diabetes mellitus due to underlying condition with diabetic autonomic (poly)neuropathy: Secondary | ICD-10-CM

## 2019-05-25 DIAGNOSIS — B351 Tinea unguium: Secondary | ICD-10-CM | POA: Diagnosis not present

## 2019-05-25 DIAGNOSIS — L98499 Non-pressure chronic ulcer of skin of other sites with unspecified severity: Secondary | ICD-10-CM | POA: Diagnosis not present

## 2019-05-25 NOTE — Progress Notes (Signed)
Subjective:   Patient ID: Raymond [name], male   DOB: 58 y.o.   MRN: [id number]   HPI Brittle diabetic presents with a chronic lesion at the first metatarsal right that is been treated in nail disease 1-5 both feet that he cannot take care of with thick yellow brittle nailbeds with long-term history of foot issues and diabetic issues   ROS      Objective:  Physical Exam  Neurovascular status unchanged with keratotic tissue subfirst metatarsal right with superficial breakdown with no subcutaneous exposure no proximal edema erythema drainage noted and thick yellow brittle nailbeds 1-5 both feet     Assessment:  Lesion secondary to pressure with mild localized ulceration along with nail disease with mycotic component 1-5 both feet     Plan:  H&P conditions reviewed and sharp sterile debridement of lesions accomplished I flushed it out and I advised on padding to keep pressure off the superficial ulcer along with wound care at home.  Debrided nailbeds 1-5 both feet and patient will be seen back as needed was given strict instructions of any redness drainage or swelling were to occur to reappoint immediately

## 2019-05-26 NOTE — Telephone Encounter (Signed)
error 

## 2019-05-30 LAB — HEMOGLOBIN A1C: Hemoglobin A1C: 10.6

## 2019-06-06 ENCOUNTER — Ambulatory Visit: Payer: BC Managed Care – PPO | Admitting: Internal Medicine

## 2019-06-06 ENCOUNTER — Telehealth: Payer: Self-pay

## 2019-06-06 NOTE — Telephone Encounter (Signed)
Attempted to reach patient to reschedule missed appointment today with Dr. Baxter Flattery. No answer. LVM to return call to reschedule.  Raymond [name]

## 2019-06-09 ENCOUNTER — Other Ambulatory Visit: Payer: Self-pay | Admitting: "Endocrinology

## 2019-06-09 NOTE — Telephone Encounter (Signed)
He can get a sample of Tresiba to cover his lapse, get his labs done and a follow up visit. If not , he has to stay with his PMD.

## 2019-06-09 NOTE — Telephone Encounter (Signed)
Pt hasn't been seen in six months. Raymond [name] had left him a message in November to call and schedule an appointment. Pt had two appointments in November he did not show up for.

## 2019-06-10 NOTE — Telephone Encounter (Signed)
Left a message requesting pt to return a call to the office. 

## 2019-06-13 NOTE — Telephone Encounter (Signed)
Discussed with pt, understanding voiced. Pt scheduled an appointment.

## 2019-06-30 ENCOUNTER — Ambulatory Visit: Payer: BC Managed Care – PPO | Admitting: "Endocrinology

## 2019-08-09 ENCOUNTER — Encounter: Payer: Self-pay | Admitting: "Endocrinology

## 2019-08-09 ENCOUNTER — Other Ambulatory Visit: Payer: Self-pay | Admitting: Podiatry

## 2019-08-09 ENCOUNTER — Ambulatory Visit (INDEPENDENT_AMBULATORY_CARE_PROVIDER_SITE_OTHER): Payer: BC Managed Care – PPO | Admitting: "Endocrinology

## 2019-08-09 DIAGNOSIS — E1165 Type 2 diabetes mellitus with hyperglycemia: Secondary | ICD-10-CM

## 2019-08-09 DIAGNOSIS — I10 Essential (primary) hypertension: Secondary | ICD-10-CM

## 2019-08-09 DIAGNOSIS — IMO0002 Reserved for concepts with insufficient information to code with codable children: Secondary | ICD-10-CM

## 2019-08-09 DIAGNOSIS — I129 Hypertensive chronic kidney disease with stage 1 through stage 4 chronic kidney disease, or unspecified chronic kidney disease: Secondary | ICD-10-CM | POA: Diagnosis not present

## 2019-08-09 DIAGNOSIS — E1122 Type 2 diabetes mellitus with diabetic chronic kidney disease: Secondary | ICD-10-CM

## 2019-08-09 DIAGNOSIS — Z9119 Patient's noncompliance with other medical treatment and regimen: Secondary | ICD-10-CM | POA: Diagnosis not present

## 2019-08-09 DIAGNOSIS — E782 Mixed hyperlipidemia: Secondary | ICD-10-CM

## 2019-08-09 DIAGNOSIS — N186 End stage renal disease: Secondary | ICD-10-CM

## 2019-08-09 DIAGNOSIS — Z91199 Patient's noncompliance with other medical treatment and regimen due to unspecified reason: Secondary | ICD-10-CM

## 2019-08-09 MED ORDER — LANTUS SOLOSTAR 100 UNIT/ML ~~LOC~~ SOPN: PEN_INJECTOR | SUBCUTANEOUS | 2 refills | Status: DC

## 2019-08-09 NOTE — Telephone Encounter (Signed)
I spoke with pt and informed that our office had received a refill request for antibiotic doxycycline, I asked if he had an open wound or infection. Pt states he does not have an open wound, but received a kidney and has been on a medication. I told pt to contact his kidney doctor concerning the medication.

## 2019-08-09 NOTE — Progress Notes (Signed)
08/09/2019, 9:27 AM                                                                                               Endocrinology Telehealth Visit Follow up Note -During COVID -19 Pandemic  This visit type was conducted due to national recommendations for restrictions regarding the COVID-19 Pandemic  in an effort to limit this patient's exposure and mitigate transmission of the corona virus.  Due to his co-morbid illnesses, Raymond [name] is at  moderate to high risk for complications without adequate follow up.  This format is felt to be most appropriate for him at this time.  I connected with this patient on 08/09/2019   by telephone and verified that I am speaking with the correct person using two identifiers. Raymond [name], [birth year]. he has verbally consented to this visit. All issues noted in this document were discussed and addressed. The format was not optimal for physical exam.    Subjective:    Patient ID: Raymond [name], male    DOB: 11-Jul-1960.  Raymond [name] is being engaged in telehealth via telephone in follow-up for management of his currently uncontrolled type 2 diabetes complicated by end-stage renal disease status post renal transplant, hypertension, hyperlipidemia .   Past Medical History:  Diagnosis Date  . Chronic kidney disease    Awaiting transplant  . Diabetes ([place])   . Diabetes mellitus without complication ([place])   . Hypertension   . Renal disorder    Past Surgical History:  Procedure Laterality Date  . COLONOSCOPY N/A 12/05/2014   PJK:DTOIZTIW external and internal hemorrhoid/mild diverticulosis/11 polyps removed  . ESOPHAGOGASTRODUODENOSCOPY N/A 12/05/2014   PYK:DXIP duodenitis  . IR FLUORO GUIDE CV LINE RIGHT  03/01/2019  . IR REMOVAL TUN CV CATH W/O FL  05/10/2019  . IR US GUIDE VASC ACCESS RIGHT  03/01/2019  . KIDNEY TRANSPLANT  03/2015   Social History   Socioeconomic History  . Marital status: Married    Spouse  name: Not on file  . Number of children: Not on file  . Years of education: Not on file  . Highest education level: Not on file  Occupational History  . Not on file  Tobacco Use  . Smoking status: Never Smoker  . Smokeless tobacco: Never Used  Substance and Sexual Activity  . Alcohol use: No    Alcohol/week: 0.0 standard drinks  . Drug use: No  . Sexual activity: Not on file  Other Topics Concern  . Not on file  Social History Narrative   ** Merged History Encounter **       Social Determinants of Health   Financial Resource Strain:   . Difficulty of Paying Living Expenses:   Food Insecurity:   . Worried About Charity fundraiser in the Last Year:   . Arboriculturist in the Last Year:   Transportation Needs:   . Film/video editor (Medical):   [name] Lack of Transportation (Non-Medical):   Physical Activity:   . Days of Exercise per Week:   .  Minutes of Exercise per Session:   Stress:   . Feeling of Stress :   Social Connections:   . Frequency of Communication with Friends and Family:   . Frequency of Social Gatherings with Friends and Family:   . Attends Religious Services:   . Active Member of Clubs or Organizations:   . Attends Archivist Meetings:   [name] Marital Status:    Outpatient Encounter Medications as of 08/09/2019  Medication Sig  . Acetaminophen-Codeine 300-30 MG tablet acetaminophen 300 mg-codeine 30 mg tablet  TAKE 1 TABLET BY MOUTH EVERY 6 HOURS AS NEEDED  . amLODipine (NORVASC) 5 MG tablet Take by mouth.  [name] amoxicillin-clavulanate (AUGMENTIN) 500-125 MG tablet Take 1 tablet by mouth 2 (two) times daily.  [name] atorvastatin (LIPITOR) 40 MG tablet Take by mouth.  . azaTHIOprine (IMURAN) 50 MG tablet   . Blood Glucose Monitoring Suppl (ACCU-CHEK GUIDE ME) w/Device KIT 1 Piece by Does not apply route as directed.  . cloNIDine (CATAPRES - DOSED IN MG/24 HR) 0.3 mg/24hr patch Place 0.3 mg onto the skin once a week.  . cyclobenzaprine (FLEXERIL) 10 MG  tablet Take 1 tablet (10 mg total) by mouth 2 (two) times daily as needed for muscle spasms.  [name] doxazosin (CARDURA) 4 MG tablet Take by mouth.  [name] glipiZIDE (GLUCOTROL XL) 5 MG 24 hr tablet Take by mouth.  [name] glucose blood (ACCU-CHEK GUIDE) test strip Use as instructed 4 x daily. E11.65  . hydrALAZINE (APRESOLINE) 25 MG tablet Take 25 mg by mouth 2 (two) times daily.  [name] HYDROcodone-acetaminophen (NORCO/VICODIN) 5-325 MG tablet TAKE 1 TABLET BY MOUTH EVERY 4 HOURS AS NEEDED FOR SHOULDER PAIN  . hydrOXYzine (ATARAX/VISTARIL) 25 MG tablet Take 25 mg by mouth 2 (two) times daily as needed.  . insulin glargine (LANTUS SOLOSTAR) 100 UNIT/ML Solostar Pen INJECT 60 UNITS SUBCUTANEOUSLY AT BEDTIME  . Insulin Pen Needle (B-D ULTRAFINE III SHORT PEN) 31G X 8 MM MISC 1 each by Does not apply route as directed.  . Multiple Vitamin (MULTIVITAMIN) capsule Take 1 capsule by mouth daily.  . mupirocin ointment (BACTROBAN) 2 % Apply 1 application topically 2 (two) times daily.  [name] omeprazole (PRILOSEC) 20 MG capsule Take by mouth.  . oxyCODONE (ROXICODONE) 5 MG immediate release tablet Take 1 tablet (5 mg total) by mouth every 8 (eight) hours as needed for up to 12 doses.  . Rivaroxaban (XARELTO) 15 MG TABS tablet Take 1 tablet (15 mg total) by mouth 2 (two) times daily with a meal for 21 days.  . rivaroxaban (XARELTO) 20 MG TABS tablet Take 1 tablet (20 mg total) by mouth daily with supper.  . sodium bicarbonate 650 MG tablet Take 1,300 mg by mouth 3 (three) times daily.  . tacrolimus (PROGRAF) 1 MG capsule Take 2 mg by mouth 2 (two) times daily. 2 capsules every morning, 2 capsules in the evening  . Vitamin D, Ergocalciferol, (DRISDOL) 50000 units CAPS capsule Take 50,000 Units by mouth every 30 (thirty) days.  . [DISCONTINUED] Insulin Glargine (LANTUS SOLOSTAR) 100 UNIT/ML Solostar Pen INJECT 50 UNITS SUBCUTANEOUSLY AT BEDTIME   Facility-Administered Encounter Medications as of 08/09/2019  Medication  . sodium  chloride flush (NS) 0.9 % injection 3 mL    ALLERGIES: No Known Allergies  VACCINATION STATUS: Immunization History  Administered Date(s) Administered  . Influenza,inj,Quad PF,6+ Mos 02/22/2019  . Influenza-Unspecified 03/05/2015, 03/09/2016    Diabetes He presents for his follow-up diabetic visit. He has type 2 diabetes mellitus. Onset time:  He was diagnosed at approximate age of 34 years. His disease course has been improving (Patient was seen in September 2019, was supposed to return 10 days from his last visit.  He continues to no-show and canceled several appointments.  His recent labs show A1c still high at 12.4%.). There are no hypoglycemic associated symptoms. Pertinent negatives for hypoglycemia include no confusion, headaches, pallor or seizures. Associated symptoms include blurred vision and polydipsia. Pertinent negatives for diabetes include no chest pain, no fatigue, no polyphagia, no polyuria and no weakness. There are no hypoglycemic complications. Symptoms are improving. Diabetic complications include nephropathy and retinopathy. (He had end-stage renal disease as a complication, status post cadaveric kidney transplant in 2016.) Risk factors for coronary artery disease include dyslipidemia, diabetes mellitus, family history, hypertension, male sex, obesity and sedentary lifestyle. He is compliant with treatment none of the time. His weight is fluctuating minimally. He is following a generally unhealthy diet. When asked about meal planning, he reported none. He has not had a previous visit with a dietitian. He never participates in exercise. (Tragically, patient still is not committed for proper monitoring of blood glucose.  He attempts to recall his readings over the phone, lowest reading 214.  He is previsit labs show A1c of 10.6%, improving from 12.4%.    ) An ACE inhibitor/angiotensin II receptor blocker is not being taken.  Hyperlipidemia This is a chronic problem. The current  episode started more than 1 year ago. Exacerbating diseases include chronic renal disease, diabetes and obesity. Pertinent negatives include no chest pain, myalgias or shortness of breath. Current antihyperlipidemic treatment includes statins (Is currently on NPH 40 units nightly.). Risk factors for coronary artery disease include diabetes mellitus, dyslipidemia, family history, obesity, male sex, hypertension and a sedentary lifestyle.  Hypertension This is a chronic problem. The current episode started more than 1 year ago. The problem is controlled. Associated symptoms include blurred vision. Pertinent negatives include no chest pain, headaches, neck pain, palpitations or shortness of breath. Risk factors for coronary artery disease include dyslipidemia, diabetes mellitus, male gender, obesity, sedentary lifestyle and family history. Past treatments include central alpha agonists. Hypertensive end-organ damage includes kidney disease and retinopathy. Patient is status post kidney transplant in 2016.. Identifiable causes of hypertension include chronic renal disease.   ROS: Limited as above.    Objective:    There were no vitals taken for this visit.  Wt Readings from Last 3 Encounters:  05/04/19 227 lb (103 kg)  04/01/19 234 lb (106.1 kg)  03/08/19 229 lb (103.9 kg)      Recent Results (from the past 2160 hour(s))  Hemoglobin A1c     Status: None   Collection Time: 05/30/19 12:00 AM  Result Value Ref Range   Hemoglobin A1C 10.6        Assessment & Plan:   1. 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) ([place]) status post cadaveric renal transplant 2016 - Raymond [name] has currently uncontrolled symptomatic type 2 DM since 59 years of age. His previsit A1c is 10.6%, progressively improving from 13.6%.  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  He remains  uncommitted and noncompliant to follow-up and recommendations. He is  status post renal transplant, retinopathy, obesity/sedentary life and he remains at extremely high risk for more acute and chronic complications which include CAD, CVA, CKD, retinopathy, and neuropathy. These are all discussed in detail with him.  -  he  admits there is a room for improvement in his diet and drink choices. -  Suggestion is made for him to avoid simple carbohydrates  from his diet including Cakes, Sweet Desserts / Pastries, Ice Cream, Soda (diet and regular), Sweet Tea, Candies, Chips, Cookies, Sweet Pastries,  Store Bought Juices, Alcohol in Excess of  1-2 drinks a day, Artificial Sweeteners, Coffee Creamer, and "Sugar-free" Products. This will help patient to have stable blood glucose profile and potentially avoid unintended weight gain.   - I encouraged him to switch to  unprocessed or minimally processed complex starch and increased protein intake (animal or plant source), fruits, and vegetables.  - he is advised to stick to a routine mealtimes to eat 3 meals  a day and avoid unnecessary snacks ( to snack only to correct hypoglycemia).   - he has been scheduled with Jearld Fenton, RDN, CDE for individualized diabetes education.  - I have approached him with the following individualized plan to manage diabetes and patient agrees:   -He remains alarmingly noncompliant.  Given his current and prevailing glycemic burden and extremely high risk of complications, he will likely require intensive treatment with basal/bolus insulin in order for him to achieve control of diabetes to target.   -Unfortunately patient's reluctance and lack of engagement makes it difficult to give him optimal adjustment of treatment.  He says he ran out of his Lantus more than a week ago. -I discussed and resumed Lantus at 60 units nightly, associated with strict monitoring of blood glucose at least 2 times a day -before meals and at  bedtime and return to clinic in 4 weeks for reevaluation.   -He will be considered for NovoLog or Humalog if he commits for proper monitoring and returns with above target postprandial hyperglycemia. - he is encouraged to call clinic for blood glucose levels less than 70 or above 300 mg /dl. -He is not a suitable candidate for metformin, SGLT2 inhibitors, nor incretin therapy. -He is advised to continue glipizide 5 mg XL daily at breakfast. -He is urged about the need for strict follow-up until his his insulin regimen is stabilized.   - Patient specific target  A1c;  LDL, HDL, Triglycerides, and  Waist Circumference were discussed in detail.  2) BP/HTN: he is advised to home monitor blood pressure and report if > 140/90 on 2 separate readings.    he is advised to continue his current medications including clonidine 0.3 mg patch weekly, and hydralazine 25 mg p.o. twice daily.    3) Lipids/HPL: No recent lipid panel to review.  He will be considered for fasting lipid panel on subsequent visits.  In the meantime, he is advised to continue atorvastatin 40 mg p.o. nightly. Side effects and precautions discussed with him.  4)  Weight/Diet: His BMI 30-a candidate for modest weight loss.  I discussed with him the fact that loss of 5 - 10% of his  current body weight will have the most impact on his diabetes management.  CDE Consult will be initiated . Exercise, and detailed carbohydrates information provided  -  detailed on discharge instructions.  5) Chronic Care/Health Maintenance:  -he  is not on ACEI/ARB , he is on Statin medications and  is encouraged to initiate and continue to follow up with Ophthalmology, Dentist,  Podiatrist at least yearly or according to recommendations, and advised to  stay away from smoking. I have recommended yearly flu vaccine and pneumonia vaccine at least every 5 years; moderate intensity  exercise for up to 150 minutes weekly; and  sleep for at least 7 hours a day.  -  I advised patient to maintain close follow up with his PCP  for primary care needs.  - Time spent on this patient care encounter:  35 min, of which >50% was spent in  counseling and the rest reviewing his  current and  previous labs/studies ( including abstraction from other facilities),  previous treatments, his blood glucose readings, and medications' doses and developing a plan for long-term care based on the latest recommendations for standards of care; and documenting his care.  Raymond [name] participated in the discussions, expressed understanding, and voiced agreement with the above plans.  All questions were answered to his satisfaction. he is encouraged to contact clinic should he have any questions or concerns prior to his return visit.   Follow up plan: - Return in about 4 weeks (around 09/06/2019), or office, for Bring Meter and Logs- A1c in Office, Include 8 log sheets.  Glade Lloyd, MD [facility] [address] [place], [place] [address] Phone: [phone]  Fax: [phone]    08/09/2019, 9:27 AM  This note was partially dictated with voice recognition software. Similar sounding words can be transcribed inadequately or may not  be corrected upon review.

## 2019-08-09 NOTE — Patient Instructions (Signed)

## 2019-09-07 ENCOUNTER — Ambulatory Visit: Payer: BC Managed Care – PPO | Admitting: Family Medicine

## 2019-09-08 ENCOUNTER — Ambulatory Visit: Payer: BC Managed Care – PPO | Admitting: "Endocrinology

## 2019-09-19 ENCOUNTER — Ambulatory Visit: Payer: BC Managed Care – PPO | Admitting: "Endocrinology

## 2019-10-03 ENCOUNTER — Ambulatory Visit: Payer: BC Managed Care – PPO | Admitting: "Endocrinology

## 2019-10-13 ENCOUNTER — Ambulatory Visit: Payer: BC Managed Care – PPO | Admitting: Family Medicine

## 2019-12-03 IMAGING — CT CT ABDOMEN AND PELVIS WITH CONTRAST
2 of 5 series · 14 of 46 positions shown, 16 images · IV contrast (APPLIED)
Comparison: None.

CLINICAL DATA: Lower abdominal/pelvic and thigh region pain on the
right

EXAM:
CT ABDOMEN AND PELVIS WITH CONTRAST
TECHNIQUE: Multidetector CT imaging of the abdomen and pelvis was performed
using the standard protocol following bolus administration of
intravenous contrast.
CONTRAST:  100mL OMNIPAQUE IOHEXOL 300 MG/ML  SOLN

[Series 2: routine abd/pel with · axial · 0.87mm/px · z∈[-1093,-643]mm · 11 of 106 slices shown, 13 images]
[im 8/106  soft-tissue]
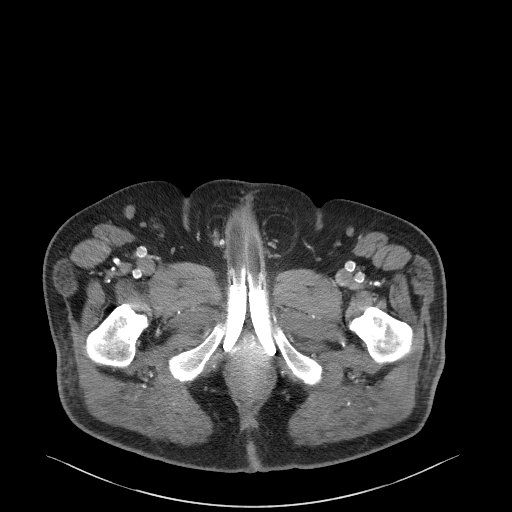
[im 8/106  bone]
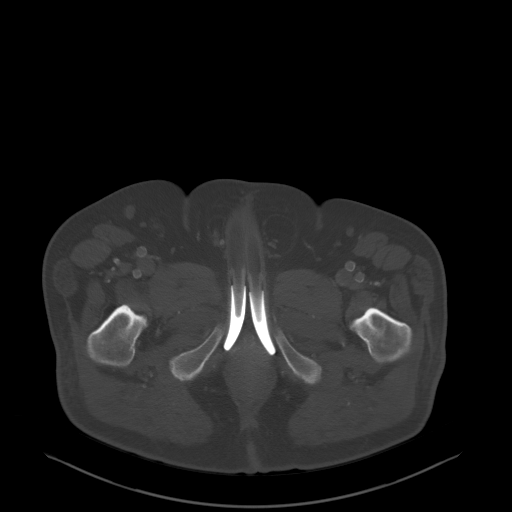
[im 16/106  soft-tissue]
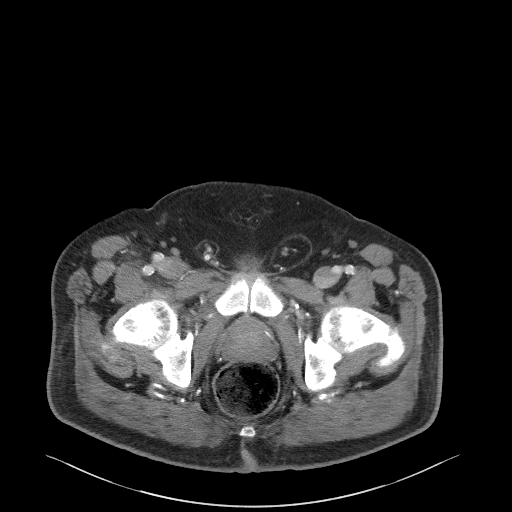
[im 23/106  soft-tissue]
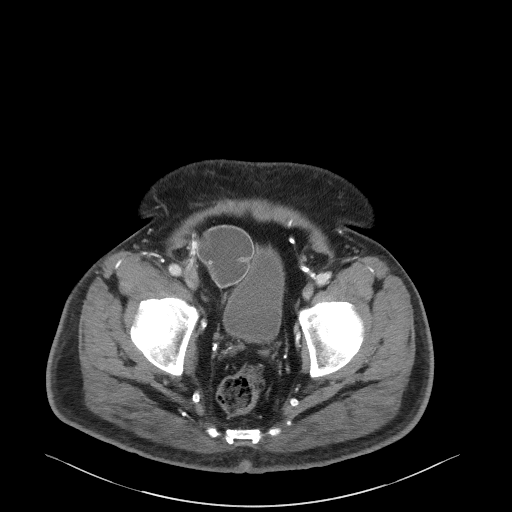
[im 38/106  soft-tissue]
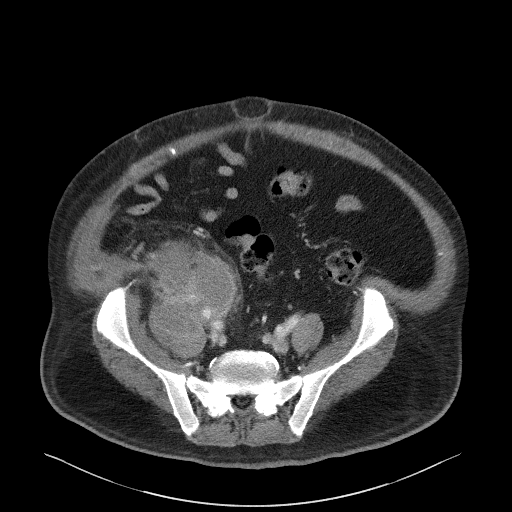
[im 46/106  soft-tissue]
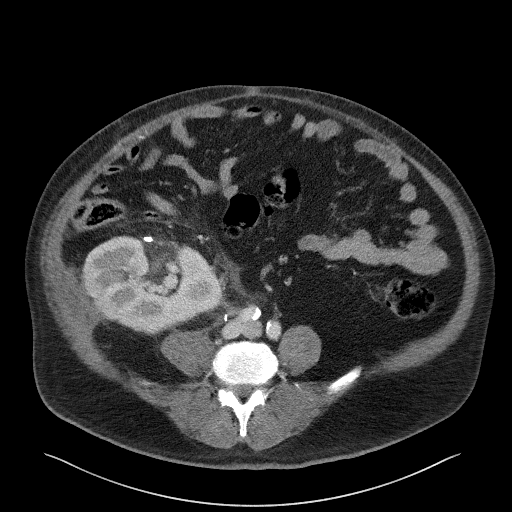
[im 53/106  soft-tissue]
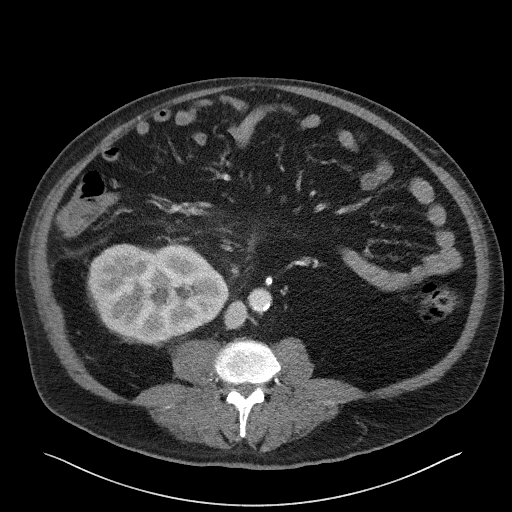
[im 61/106  soft-tissue]
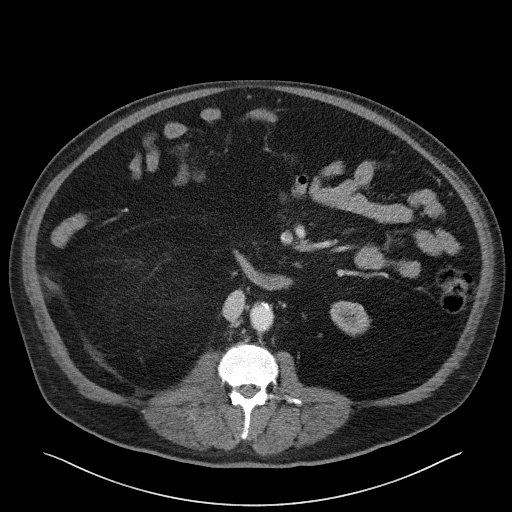
[im 68/106  soft-tissue]
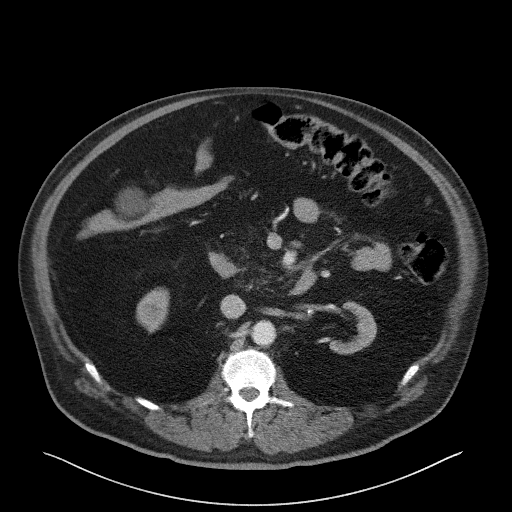
[im 83/106  soft-tissue]
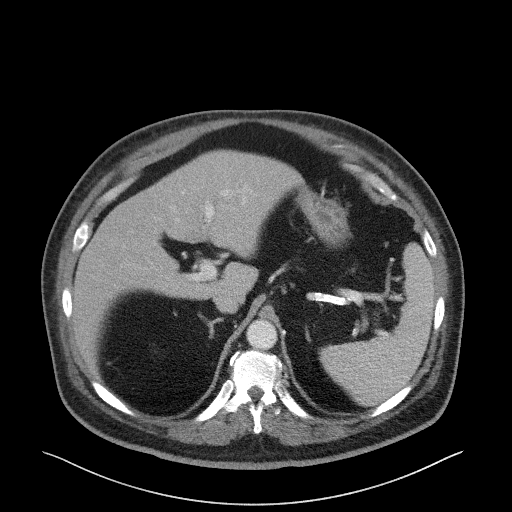
[im 83/106  bone]
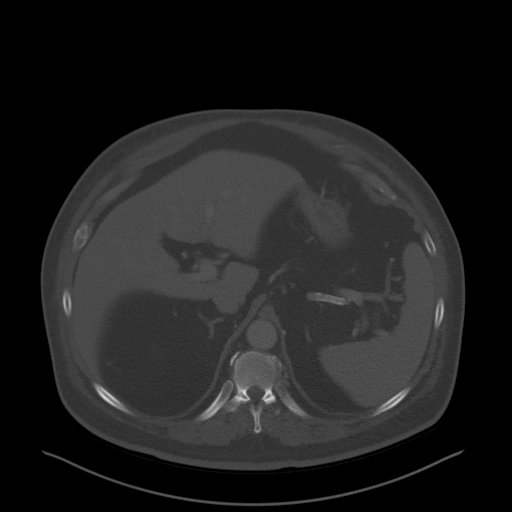
[im 91/106  soft-tissue]
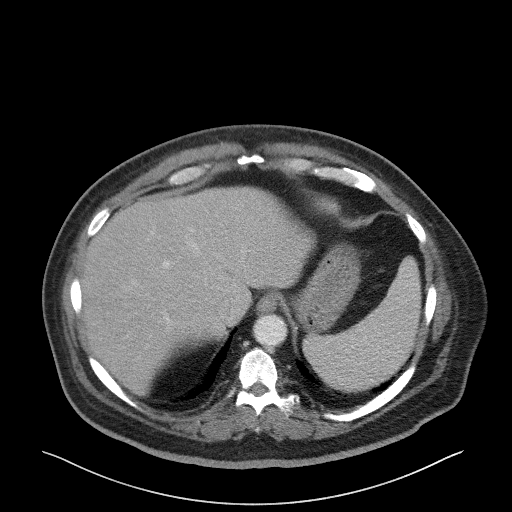
[im 98/106  soft-tissue]
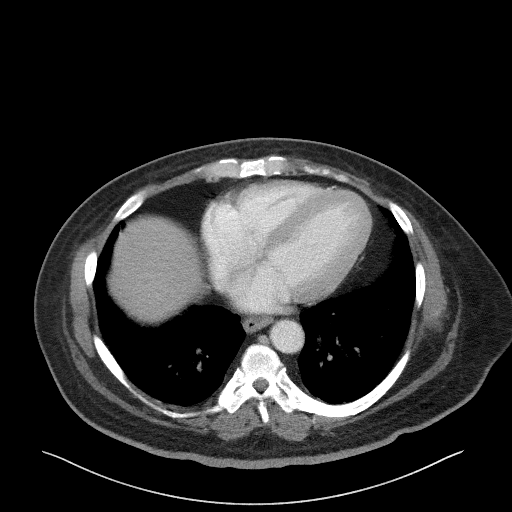

[Series 5: coronal st · coronal · 0.89mm/px · 3 of 121 slices shown]
[im 41/121  soft-tissue]
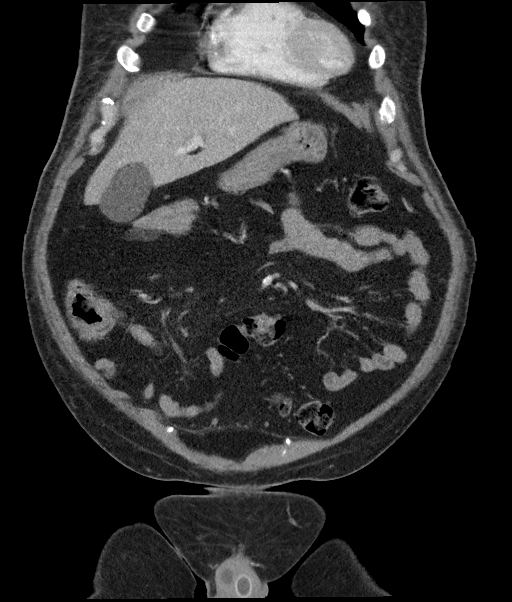
[im 54/121  soft-tissue]
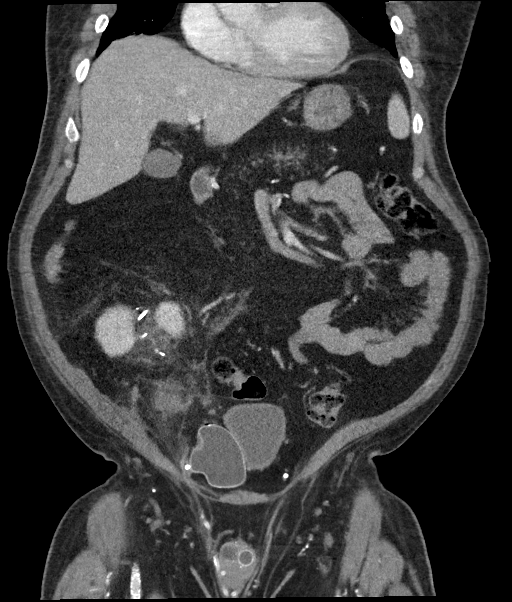
[im 67/121  soft-tissue]
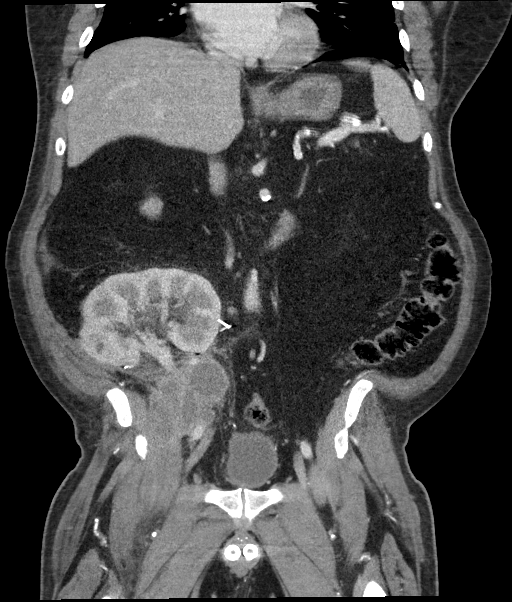

[14 of 46 positions shown; findings below may reference images not displayed]

FINDINGS: Lower chest: There is bibasilar atelectasis. No lung base
consolidation evident.

Hepatobiliary: No focal liver lesions are appreciable on this study.
There is cholelithiasis. The gallbladder wall is not appreciably
thickened. There is no biliary duct dilatation.

Pancreas: Pancreas is virtually completely fatty replaced. No
pancreatic mass or inflammatory focus evident.

Spleen: No splenic lesions are evident.

Adrenals/Urinary Tract: There is a benign adenoma in the right
adrenal measuring 1.2 x 1.1 cm. Left adrenal appears normal. Native
kidneys are in the flank positions and appear atrophic. There is no
native renal mass or hydronephrosis. No native renal calculus or
ureterectasis evident.

There is a transplant kidney in the right lower quadrant. This
kidney is diffusely edematous with perinephric stranding and
perinephric fluid. There are surgical clips in this area. There is
mild hydronephrosis of the transplant kidney. There is a Peri renal
transplant loculated fluid collection which surrounds and narrows
the proximal transplant renal artery. This thick-walled complex
fluid collection measures 11.0 x 7.8 x 6.7 cm. This fluid collection
abuts the ileus psoas muscle on the right at the mid pelvic level.
Note that there is no appreciable transplant ureteral calculus.

Urinary bladder is midline with wall thickness within normal limits.
Penile prosthesis abuts the bladder from the rightward aspect.

Stomach/Bowel: There is no appreciable bowel wall or mesenteric
thickening. There are scattered left-sided colonic diverticula
without diverticulitis. No evident bowel obstruction. There is no
free air or portal venous air.

Vascular/Lymphatic: There is aortic and iliac artery
atherosclerosis. No evident aneurysm arising from the aorta or iliac
arteries. No adenopathy is appreciable in the abdomen or pelvis.

Reproductive: Prostate and seminal vesicles appear normal in size
and contour. There is a penile prosthesis with intact reservoir on
the right.

Other: Appendix appears normal. There is no appreciable ascites in
the abdomen or pelvis. There is fat in each inguinal ring. There is
a small ventral hernia containing only fat.

Musculoskeletal: There are no blastic or lytic bone lesions. There
is a prominent Schmorl's node along the superior aspect of the L2
vertebral body. There is no intramuscular or abdominal wall lesion.
IMPRESSION: 1. There is a right lower quadrant transplant kidney. This kidney is
edematous with perinephric fluid and stranding. There is mild
hydronephrosis. These are findings indicative of pyelonephritis
involving the transplant kidney. The transplant renal or ureteral
calculus evident.

2. Slightly inferior to the transplant kidney on the right, there is
a complex thick-walled fluid collection with surrounding perinephric
stranding felt to represent an abscess measuring 11.0 x 7.8 x
cm. This collection narrows of the transplant renal artery on the
right.

3. Cholelithiasis. No gallbladder wall thickening or pericholecystic
fluid.

4. Atrophic native kidneys in the flank positions. No native kidney
hydronephrosis. No renal or ureteral calculi.

5.  No bowel obstruction.  Appendix appears normal.

6. Penile prosthesis with reservoir adjacent to the urinary bladder
on the right. Reservoir appears intact.

7.  Aortic Atherosclerosis ([id number]).

8.  Small ventral hernia containing only fat.

9.  Pancreas virtually completely fatty replaced.

These results were called by telephone at the time of interpretation
on 09/17/2018 at [DATE] to Dr. ENDORFINA SPORT MEINERT , who verbally
acknowledged these results.

## 2019-12-12 ENCOUNTER — Other Ambulatory Visit: Payer: Self-pay

## 2019-12-12 ENCOUNTER — Other Ambulatory Visit: Payer: Self-pay | Admitting: Nurse Practitioner

## 2019-12-12 ENCOUNTER — Ambulatory Visit (INDEPENDENT_AMBULATORY_CARE_PROVIDER_SITE_OTHER): Payer: BC Managed Care – PPO | Admitting: Nurse Practitioner

## 2019-12-12 ENCOUNTER — Encounter: Payer: Self-pay | Admitting: Nurse Practitioner

## 2019-12-12 VITALS — BP 124/76 | HR 64 | Temp 98.1°F | Ht 69.0 in | Wt 220.0 lb

## 2019-12-12 DIAGNOSIS — IMO0002 Reserved for concepts with insufficient information to code with codable children: Secondary | ICD-10-CM

## 2019-12-12 DIAGNOSIS — N186 End stage renal disease: Secondary | ICD-10-CM

## 2019-12-12 DIAGNOSIS — E1165 Type 2 diabetes mellitus with hyperglycemia: Secondary | ICD-10-CM | POA: Diagnosis not present

## 2019-12-12 DIAGNOSIS — Z114 Encounter for screening for human immunodeficiency virus [HIV]: Secondary | ICD-10-CM

## 2019-12-12 DIAGNOSIS — E782 Mixed hyperlipidemia: Secondary | ICD-10-CM | POA: Diagnosis not present

## 2019-12-12 DIAGNOSIS — I10 Essential (primary) hypertension: Secondary | ICD-10-CM | POA: Diagnosis not present

## 2019-12-12 DIAGNOSIS — Z940 Kidney transplant status: Secondary | ICD-10-CM

## 2019-12-12 DIAGNOSIS — E1122 Type 2 diabetes mellitus with diabetic chronic kidney disease: Secondary | ICD-10-CM | POA: Diagnosis not present

## 2019-12-12 DIAGNOSIS — Z1159 Encounter for screening for other viral diseases: Secondary | ICD-10-CM

## 2019-12-12 LAB — POCT URINALYSIS DIPSTICK
Bilirubin, UA: NEGATIVE
Blood, UA: NEGATIVE
Glucose, UA: POSITIVE — AB
Ketones, UA: NEGATIVE
Leukocytes, UA: NEGATIVE
Nitrite, UA: NEGATIVE
Protein, UA: POSITIVE — AB
Spec Grav, UA: 1.025 (ref 1.010–1.025)
Urobilinogen, UA: 0.2 E.U./dL
pH, UA: 5.5 (ref 5.0–8.0)

## 2019-12-12 LAB — POCT UA - MICROALBUMIN
Creatinine, POC: 300 mg/dL
Microalbumin Ur, POC: 150 mg/L

## 2019-12-12 NOTE — Progress Notes (Signed)
This visit occurred during the SARS-CoV-2 public health emergency.  Safety protocols were in place, including screening questions prior to the visit, additional usage of staff PPE, and extensive cleaning of exam room while observing appropriate contact time as indicated for disinfecting solutions.  Subjective:     Patient ID: Raymond [name] , male    DOB: [birth year] , 59 y.o.   MRN: [id number]   Chief Complaint  Patient presents with  . Establish Care    HPI  Presents today to establish care with the practice. He heard about the clinic via a friend of his wife who comes to office. He is from [facility] and before he saw a Dr. Legrand Rams in [place]. He had a kidney transplant 3 years ago and is followed by the doctor in [place]. He was on dialysis for six years. He was in [place] for 30 years and moved back to [facility] in 2015 to help with family.   He has two children that are in good health. His father died from Suicide at the age of 19. His mother is deceased and died from pancreatic caner at age 30. He has an older brother who passed away from pancreatic cancer at age 41. He has two younger brother and one sister that are in good health. 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  He is a patient at [facility] and he recently seen them for a foot ulcer. He goes to [facility] and had laser surgery two months ago. He had the [facility] in April and did well. He states that he is active and walks at the gym at least twice per week for an hour. He is not on any diet and reports that he eats late and expresses that he need help with managing his foods. He does drink 5 bottles of water per day. He is not on any ETOH or illegal drugs.  Wt Readings from Last 3 Encounters: 12/12/19 : 220 lb (99.8 kg) 05/04/19 : 227 lb (103 kg) 04/01/19 : 234 lb (106.1 kg)  He had been seeing Dr. Dorris Fetch (Endocrinology) with his last visit in March 2021, He continues to use  Lantus 60 units once daily.  He is also taking Glipizide two times a day.    He is going to Nephrology in [place] - had transplant - left kidney transplant only has one was done 3 years ago, 4 years October 14th.      Past Medical History:  Diagnosis Date  . Chronic kidney disease    Awaiting transplant  . Diabetes ([place])   . Diabetes mellitus without complication ([place])   . Hypertension   . Renal disorder      Family History  Problem Relation Age of Onset  . Diabetes Mother   . Heart disease Father   . Colon cancer Neg Hx      Current Outpatient Medications:  .  amLODipine (NORVASC) 5 MG tablet, Take by mouth., Disp: , Rfl:  .  Blood Glucose Monitoring Suppl (ACCU-CHEK GUIDE ME) w/Device KIT, 1 Piece by Does not apply route as directed., Disp: 1 kit, Rfl: 0 .  doxazosin (CARDURA) 4 MG tablet, Take by mouth., Disp: , Rfl:  .  glipiZIDE (GLUCOTROL XL) 5 MG 24 hr tablet, Take by mouth., Disp: , Rfl:  .  glucose blood (ACCU-CHEK GUIDE) test strip, Use as instructed 4 x daily. E11.65, Disp: 150 each, Rfl: 5 .  hydrALAZINE (APRESOLINE) 25 MG tablet, Take 25 mg by mouth 2 (two) times daily., Disp: , Rfl:  .  insulin glargine (LANTUS SOLOSTAR) 100 UNIT/ML Solostar Pen, INJECT 60 UNITS SUBCUTANEOUSLY AT BEDTIME, Disp: 10 pen, Rfl: 2 .  Insulin Pen Needle (B-D ULTRAFINE III SHORT PEN) 31G X 8 MM MISC, 1 each by Does not apply route as directed., Disp: 100 each, Rfl: 3 .  Multiple Vitamin (MULTIVITAMIN) capsule, Take 1 capsule by mouth daily., Disp: , Rfl:  .  sodium bicarbonate 650 MG tablet, Take 1,300 mg by mouth 3 (three) times daily., Disp: , Rfl:  .  Vitamin D, Ergocalciferol, (DRISDOL) 50000 units CAPS capsule, Take 50,000 Units by mouth every 30 (thirty) days., Disp: , Rfl:   Current Facility-Administered Medications:  .  sodium chloride flush (NS) 0.9 % injection 3 mL, 3 mL, Intravenous, Q12H, Gwenlyn Found Pearletha Forge, MD   No Known Allergies   Review of Systems  Constitutional:  Negative.   Eyes: Negative.   Respiratory: Negative.  Negative for shortness of breath and wheezing.   Cardiovascular: Negative.  Negative for chest pain, palpitations and leg swelling.  Endocrine: Negative for polydipsia, polyphagia and polyuria.  Genitourinary: Negative.   Musculoskeletal: Negative.   Skin: Negative.   Neurological: Negative.  Negative for weakness and numbness.  Psychiatric/Behavioral: Negative.      Today's Vitals   12/12/19 1040  BP: 124/76  Pulse: 64  Temp: 98.1 F (36.7 C)  TempSrc: Oral  SpO2: 97%  Weight: 220 lb (99.8 kg)  Height: _0  (1.753 m)  PainSc: 0-No pain   Body mass index is 32.49 kg/m.   Objective:  Physical Exam Constitutional:      General: He is not in acute distress.    Appearance: Normal appearance. He is obese.  Cardiovascular:     Rate and Rhythm: Normal rate and regular rhythm.     Pulses: Normal pulses.     Heart sounds: Normal heart sounds. No murmur heard.   Pulmonary:     Effort: Pulmonary effort is normal. No respiratory distress.     Breath sounds: Normal breath sounds.  Neurological:     General: No focal deficit present.     Mental Status: He is alert and oriented to person, place, and time.  Psychiatric:        Mood and Affect: Mood normal.        Behavior: Behavior normal.        Thought Content: Thought content normal.        Judgment: Judgment normal.         Assessment And Plan:     1. 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 Mixed hyperlipidemia  Chronic, controlled  Continue with current medications - Lipid  panel  3. History of renal transplant  Done in [place]  4. Essential hypertension, benign  Chronic, blood pressure is well controlled  Continue current regimen  5. Encounter for hepatitis C screening test for low risk patient  Will check Hepatitis C screening due to recent recommendations to screen all adults 18 years and older - Hepatitis C antibody  6. Encounter for  HIV (human immunodeficiency virus) test - HIV Antibody (routine testing w rflx)    Patient was given opportunity to ask questions. Patient verbalized understanding of the plan and was able to repeat key elements of the plan. All questions were answered to their satisfaction.  [name], FNP   I, [name], FNP, have reviewed all documentation for this visit. The documentation on 12/21/19 for the exam, diagnosis, procedures, and orders are all accurate and complete.  THE PATIENT IS ENCOURAGED TO PRACTICE SOCIAL DISTANCING DUE TO THE COVID-19 PANDEMIC.

## 2019-12-13 LAB — LIPID PANEL
Chol/HDL Ratio: 4.5 ratio (ref 0.0–5.0)
Cholesterol, Total: 183 mg/dL (ref 100–199)
HDL: 41 mg/dL (ref >39)
LDL Chol Calc (NIH): 125 mg/dL — ABNORMAL HIGH (ref 0–99)
Triglycerides: 94 mg/dL (ref 0–149)
VLDL Cholesterol Cal: 17 mg/dL (ref 5–40)

## 2019-12-13 LAB — CBC
Hematocrit: 45.2 % (ref 37.5–51.0)
Hemoglobin: 13.7 g/dL (ref 13.0–17.7)
MCH: 25.5 pg — ABNORMAL LOW (ref 26.6–33.0)
MCHC: 30.3 g/dL — ABNORMAL LOW (ref 31.5–35.7)
MCV: 84 fL (ref 79–97)
Platelets: 119 10*3/uL — ABNORMAL LOW (ref 150–450)
RBC: 5.37 x10E6/uL (ref 4.14–5.80)
RDW: 13.9 % (ref 11.6–15.4)
WBC: 3.3 10*3/uL — ABNORMAL LOW (ref 3.4–10.8)

## 2019-12-13 LAB — HEMOGLOBIN A1C
Est. average glucose Bld gHb Est-mCnc: 335 mg/dL
Hgb A1c MFr Bld: 13.3 % — ABNORMAL HIGH (ref 4.8–5.6)

## 2019-12-13 LAB — CMP14+EGFR
ALT: 22 IU/L (ref 0–44)
AST: 15 IU/L (ref 0–40)
Albumin/Globulin Ratio: 1.1 — ABNORMAL LOW (ref 1.2–2.2)
Albumin: 4.1 g/dL (ref 3.8–4.9)
Alkaline Phosphatase: 141 IU/L — ABNORMAL HIGH (ref 48–121)
BUN/Creatinine Ratio: 21 — ABNORMAL HIGH (ref 9–20)
BUN: 28 mg/dL — ABNORMAL HIGH (ref 6–24)
Bilirubin Total: 0.4 mg/dL (ref 0.0–1.2)
CO2: 22 mmol/L (ref 20–29)
Calcium: 9.8 mg/dL (ref 8.7–10.2)
Chloride: 104 mmol/L (ref 96–106)
Creatinine, Ser: 1.34 mg/dL — ABNORMAL HIGH (ref 0.76–1.27)
GFR calc Af Amer: 67 mL/min/{1.73_m2} (ref >59)
GFR calc non Af Amer: 58 mL/min/{1.73_m2} — ABNORMAL LOW (ref >59)
Globulin, Total: 3.7 g/dL (ref 1.5–4.5)
Glucose: 369 mg/dL — ABNORMAL HIGH (ref 65–99)
Potassium: 4.8 mmol/L (ref 3.5–5.2)
Sodium: 138 mmol/L (ref 134–144)
Total Protein: 7.8 g/dL (ref 6.0–8.5)

## 2019-12-13 LAB — HIV ANTIBODY (ROUTINE TESTING W REFLEX): HIV Screen 4th Generation wRfx: NONREACTIVE

## 2019-12-13 LAB — HEPATITIS C ANTIBODY: Hep C Virus Ab: <0.1 s/co ratio (ref 0.0–0.9)

## 2020-01-11 ENCOUNTER — Telehealth: Payer: Self-pay | Admitting: Cardiovascular Disease

## 2020-01-11 NOTE — Telephone Encounter (Signed)
Patient states he is experiencing left leg pain and he is requesting an order for a LE arterial doppler. Please return call to discuss.

## 2020-01-11 NOTE — Telephone Encounter (Signed)
Called and spoke with pt he states that his RIGHT leg is in pain and he is having swelling in it. He states the vein is "coming up running down my leg". He reports that it is red but not hot to touch. Just that it is very painful and it is painful behind his kneecap . He reports that his Left leg is fine.  Notified I would send this message to Dr.Berry to advise on.

## 2020-01-11 NOTE — Telephone Encounter (Signed)
Secure message from RN working with Dr.Berry received-  he said- he should go to ER. he needs to be evaluated for blood clot.   called and spoke with pt. Reviewed Dr.Berry's advice and advise he go to his nearest ED to be evaluated. Pt verbalized understanding with no other questions at this time.

## 2020-01-12 ENCOUNTER — Emergency Department (HOSPITAL_COMMUNITY): Payer: BC Managed Care – PPO

## 2020-01-12 ENCOUNTER — Other Ambulatory Visit: Payer: Self-pay

## 2020-01-12 ENCOUNTER — Encounter (HOSPITAL_COMMUNITY): Payer: Self-pay | Admitting: Emergency Medicine

## 2020-01-12 ENCOUNTER — Inpatient Hospital Stay (HOSPITAL_COMMUNITY)
Admission: EM | Admit: 2020-01-12 | Discharge: 2020-01-20 | DRG: 638 | Disposition: A | Discharge status: Home / Self Care | Payer: BC Managed Care – PPO | Attending: Internal Medicine | Admitting: Internal Medicine

## 2020-01-12 ENCOUNTER — Other Ambulatory Visit: Payer: Self-pay | Admitting: Podiatry

## 2020-01-12 ENCOUNTER — Ambulatory Visit (INDEPENDENT_AMBULATORY_CARE_PROVIDER_SITE_OTHER): Payer: BC Managed Care – PPO | Admitting: Podiatry

## 2020-01-12 DIAGNOSIS — T8619 Other complication of kidney transplant: Secondary | ICD-10-CM | POA: Diagnosis present

## 2020-01-12 DIAGNOSIS — E11621 Type 2 diabetes mellitus with foot ulcer: Secondary | ICD-10-CM | POA: Diagnosis present

## 2020-01-12 DIAGNOSIS — R7881 Bacteremia: Secondary | ICD-10-CM | POA: Diagnosis present

## 2020-01-12 DIAGNOSIS — I120 Hypertensive chronic kidney disease with stage 5 chronic kidney disease or end stage renal disease: Secondary | ICD-10-CM | POA: Diagnosis present

## 2020-01-12 DIAGNOSIS — Z452 Encounter for adjustment and management of vascular access device: Secondary | ICD-10-CM

## 2020-01-12 DIAGNOSIS — E1122 Type 2 diabetes mellitus with diabetic chronic kidney disease: Secondary | ICD-10-CM | POA: Diagnosis present

## 2020-01-12 DIAGNOSIS — Z940 Kidney transplant status: Secondary | ICD-10-CM | POA: Diagnosis not present

## 2020-01-12 DIAGNOSIS — L089 Local infection of the skin and subcutaneous tissue, unspecified: Secondary | ICD-10-CM | POA: Diagnosis not present

## 2020-01-12 DIAGNOSIS — I998 Other disorder of circulatory system: Secondary | ICD-10-CM | POA: Diagnosis present

## 2020-01-12 DIAGNOSIS — N179 Acute kidney failure, unspecified: Secondary | ICD-10-CM | POA: Diagnosis present

## 2020-01-12 DIAGNOSIS — Z79899 Other long term (current) drug therapy: Secondary | ICD-10-CM | POA: Diagnosis not present

## 2020-01-12 DIAGNOSIS — I10 Essential (primary) hypertension: Secondary | ICD-10-CM | POA: Diagnosis not present

## 2020-01-12 DIAGNOSIS — E1165 Type 2 diabetes mellitus with hyperglycemia: Secondary | ICD-10-CM | POA: Diagnosis not present

## 2020-01-12 DIAGNOSIS — D849 Immunodeficiency, unspecified: Secondary | ICD-10-CM | POA: Diagnosis present

## 2020-01-12 DIAGNOSIS — L03119 Cellulitis of unspecified part of limb: Secondary | ICD-10-CM

## 2020-01-12 DIAGNOSIS — I129 Hypertensive chronic kidney disease with stage 1 through stage 4 chronic kidney disease, or unspecified chronic kidney disease: Secondary | ICD-10-CM | POA: Diagnosis present

## 2020-01-12 DIAGNOSIS — L03115 Cellulitis of right lower limb: Secondary | ICD-10-CM | POA: Diagnosis present

## 2020-01-12 DIAGNOSIS — I8001 Phlebitis and thrombophlebitis of superficial vessels of right lower extremity: Secondary | ICD-10-CM | POA: Diagnosis not present

## 2020-01-12 DIAGNOSIS — L02619 Cutaneous abscess of unspecified foot: Secondary | ICD-10-CM | POA: Diagnosis not present

## 2020-01-12 DIAGNOSIS — M869 Osteomyelitis, unspecified: Secondary | ICD-10-CM

## 2020-01-12 DIAGNOSIS — Y830 Surgical operation with transplant of whole organ as the cause of abnormal reaction of the patient, or of later complication, without mention of misadventure at the time of the procedure: Secondary | ICD-10-CM | POA: Diagnosis present

## 2020-01-12 DIAGNOSIS — E6609 Other obesity due to excess calories: Secondary | ICD-10-CM | POA: Diagnosis present

## 2020-01-12 DIAGNOSIS — T148XXA Other injury of unspecified body region, initial encounter: Secondary | ICD-10-CM | POA: Diagnosis present

## 2020-01-12 DIAGNOSIS — D696 Thrombocytopenia, unspecified: Secondary | ICD-10-CM | POA: Diagnosis present

## 2020-01-12 DIAGNOSIS — Z794 Long term (current) use of insulin: Secondary | ICD-10-CM | POA: Diagnosis not present

## 2020-01-12 DIAGNOSIS — L97519 Non-pressure chronic ulcer of other part of right foot with unspecified severity: Secondary | ICD-10-CM | POA: Diagnosis present

## 2020-01-12 DIAGNOSIS — B9562 Methicillin resistant Staphylococcus aureus infection as the cause of diseases classified elsewhere: Secondary | ICD-10-CM

## 2020-01-12 DIAGNOSIS — Z8249 Family history of ischemic heart disease and other diseases of the circulatory system: Secondary | ICD-10-CM

## 2020-01-12 DIAGNOSIS — Z20822 Contact with and (suspected) exposure to covid-19: Secondary | ICD-10-CM | POA: Diagnosis present

## 2020-01-12 DIAGNOSIS — E119 Type 2 diabetes mellitus without complications: Secondary | ICD-10-CM | POA: Diagnosis present

## 2020-01-12 DIAGNOSIS — R739 Hyperglycemia, unspecified: Secondary | ICD-10-CM

## 2020-01-12 DIAGNOSIS — I70229 Atherosclerosis of native arteries of extremities with rest pain, unspecified extremity: Secondary | ICD-10-CM | POA: Diagnosis present

## 2020-01-12 DIAGNOSIS — I739 Peripheral vascular disease, unspecified: Secondary | ICD-10-CM

## 2020-01-12 DIAGNOSIS — N186 End stage renal disease: Secondary | ICD-10-CM | POA: Diagnosis present

## 2020-01-12 DIAGNOSIS — Z833 Family history of diabetes mellitus: Secondary | ICD-10-CM | POA: Diagnosis not present

## 2020-01-12 DIAGNOSIS — Z419 Encounter for procedure for purposes other than remedying health state, unspecified: Secondary | ICD-10-CM

## 2020-01-12 DIAGNOSIS — L02611 Cutaneous abscess of right foot: Secondary | ICD-10-CM | POA: Diagnosis not present

## 2020-01-12 DIAGNOSIS — Z6831 Body mass index (BMI) 31.0-31.9, adult: Secondary | ICD-10-CM | POA: Diagnosis not present

## 2020-01-12 DIAGNOSIS — E11628 Type 2 diabetes mellitus with other skin complications: Secondary | ICD-10-CM

## 2020-01-12 DIAGNOSIS — Z789 Other specified health status: Secondary | ICD-10-CM | POA: Diagnosis not present

## 2020-01-12 DIAGNOSIS — M79604 Pain in right leg: Secondary | ICD-10-CM

## 2020-01-12 DIAGNOSIS — M86171 Other acute osteomyelitis, right ankle and foot: Secondary | ICD-10-CM | POA: Diagnosis not present

## 2020-01-12 DIAGNOSIS — E1169 Type 2 diabetes mellitus with other specified complication: Secondary | ICD-10-CM | POA: Diagnosis present

## 2020-01-12 HISTORY — DX: Disorder of kidney and ureter, unspecified: N28.9

## 2020-01-12 LAB — SARS CORONAVIRUS 2 BY RT PCR (HOSPITAL ORDER, PERFORMED IN ~~LOC~~ HOSPITAL LAB): SARS Coronavirus 2: NEGATIVE

## 2020-01-12 LAB — BASIC METABOLIC PANEL
Anion gap: 11 (ref 5–15)
BUN: 55 mg/dL — ABNORMAL HIGH (ref 6–20)
CO2: 21 mmol/L — ABNORMAL LOW (ref 22–32)
Calcium: 9.7 mg/dL (ref 8.9–10.3)
Chloride: 96 mmol/L — ABNORMAL LOW (ref 98–111)
Creatinine, Ser: 2.1 mg/dL — ABNORMAL HIGH (ref 0.61–1.24)
GFR calc Af Amer: 39 mL/min — ABNORMAL LOW (ref >60)
GFR calc non Af Amer: 33 mL/min — ABNORMAL LOW (ref >60)
Glucose, Bld: 609 mg/dL (ref 70–99)
Potassium: 4.7 mmol/L (ref 3.5–5.1)
Sodium: 128 mmol/L — ABNORMAL LOW (ref 135–145)

## 2020-01-12 LAB — CBC WITH DIFFERENTIAL/PLATELET
Band Neutrophils: 28 %
Basophils Absolute: 0 10*3/uL (ref 0.0–0.1)
Basophils Relative: 0 %
Eosinophils Absolute: 0 10*3/uL (ref 0.0–0.5)
Eosinophils Relative: 0 %
HCT: 45.2 % (ref 39.0–52.0)
Hemoglobin: 14.1 g/dL (ref 13.0–17.0)
Lymphocytes Relative: 2 %
Lymphs Abs: 0.1 10*3/uL — ABNORMAL LOW (ref 0.7–4.0)
MCH: 25.8 pg — ABNORMAL LOW (ref 26.0–34.0)
MCHC: 31.2 g/dL (ref 30.0–36.0)
MCV: 82.8 fL (ref 80.0–100.0)
Monocytes Absolute: 0.8 10*3/uL (ref 0.1–1.0)
Monocytes Relative: 13 %
Myelocytes: 1 %
Neutro Abs: 5 10*3/uL (ref 1.7–7.7)
Neutrophils Relative %: 55 %
Platelets: 81 10*3/uL — ABNORMAL LOW (ref 150–400)
Promyelocytes Relative: 1 %
RBC: 5.46 MIL/uL (ref 4.22–5.81)
RDW: 14.1 % (ref 11.5–15.5)
WBC Morphology: INCREASED
WBC: 6 10*3/uL (ref 4.0–10.5)
nRBC: 0 % (ref 0.0–0.2)

## 2020-01-12 LAB — CBG MONITORING, ED
Glucose-Capillary: >600 mg/dL (ref 70–99)
Glucose-Capillary: >600 mg/dL (ref 70–99)

## 2020-01-12 LAB — HEMOGLOBIN A1C
Hgb A1c MFr Bld: 13.7 % — ABNORMAL HIGH (ref 4.8–5.6)
Mean Plasma Glucose: 346.49 mg/dL

## 2020-01-12 LAB — GLUCOSE, CAPILLARY
Glucose-Capillary: 342 mg/dL — ABNORMAL HIGH (ref 70–99)
Glucose-Capillary: 154 mg/dL — ABNORMAL HIGH (ref 70–99)

## 2020-01-12 MED ORDER — SODIUM CHLORIDE 0.9 % IV BOLUS
1000.0000 mL | Freq: Once | INTRAVENOUS | Status: AC
  Administered 2020-01-12: 1000 mL via INTRAVENOUS

## 2020-01-12 MED ORDER — DAPTOMYCIN 500 MG IV SOLR
INTRAVENOUS | Status: AC
  Filled 2020-01-12: qty 10

## 2020-01-12 MED ORDER — INSULIN ASPART 100 UNIT/ML ~~LOC~~ SOLN
0.0000 [IU] | Freq: Three times a day (TID) | SUBCUTANEOUS | Status: DC
  Administered 2020-01-13: 3 [IU] via SUBCUTANEOUS
  Administered 2020-01-13: 5 [IU] via SUBCUTANEOUS
  Administered 2020-01-13 – 2020-01-14 (×3): 2 [IU] via SUBCUTANEOUS
  Administered 2020-01-14: 3 [IU] via SUBCUTANEOUS
  Administered 2020-01-15: 2 [IU] via SUBCUTANEOUS
  Administered 2020-01-15 (×2): 3 [IU] via SUBCUTANEOUS
  Administered 2020-01-16: 2 [IU] via SUBCUTANEOUS
  Administered 2020-01-16: 3 [IU] via SUBCUTANEOUS
  Administered 2020-01-17: 5 [IU] via SUBCUTANEOUS
  Administered 2020-01-17: 3 [IU] via SUBCUTANEOUS
  Administered 2020-01-18 (×3): 2 [IU] via SUBCUTANEOUS
  Administered 2020-01-19: 5 [IU] via SUBCUTANEOUS
  Administered 2020-01-19 – 2020-01-20 (×3): 2 [IU] via SUBCUTANEOUS

## 2020-01-12 MED ORDER — VANCOMYCIN HCL IN DEXTROSE 1-5 GM/200ML-% IV SOLN
1000.0000 mg | Freq: Once | INTRAVENOUS | Status: AC
  Administered 2020-01-12: 1000 mg via INTRAVENOUS
  Filled 2020-01-12: qty 200

## 2020-01-12 MED ORDER — DOXAZOSIN MESYLATE 2 MG PO TABS
4.0000 mg | ORAL_TABLET | Freq: Every day | ORAL | Status: DC
  Administered 2020-01-13 – 2020-01-20 (×8): 4 mg via ORAL
  Filled 2020-01-12 (×4): qty 2
  Filled 2020-01-12: qty 1
  Filled 2020-01-12 (×4): qty 2

## 2020-01-12 MED ORDER — HYDRALAZINE HCL 25 MG PO TABS
25.0000 mg | ORAL_TABLET | Freq: Two times a day (BID) | ORAL | Status: DC
  Administered 2020-01-12 – 2020-01-20 (×16): 25 mg via ORAL
  Filled 2020-01-12 (×16): qty 1

## 2020-01-12 MED ORDER — SODIUM CHLORIDE 0.9 % IV SOLN
650.0000 mg | Freq: Every day | INTRAVENOUS | Status: DC
  Administered 2020-01-12: 650 mg via INTRAVENOUS
  Filled 2020-01-12 (×4): qty 13

## 2020-01-12 MED ORDER — SODIUM CHLORIDE 0.9 % IV SOLN: INTRAVENOUS | Status: DC

## 2020-01-12 MED ORDER — RENA-VITE PO TABS
1.0000 | ORAL_TABLET | Freq: Every day | ORAL | Status: DC
  Administered 2020-01-13 – 2020-01-20 (×8): 1 via ORAL
  Filled 2020-01-12 (×8): qty 1

## 2020-01-12 MED ORDER — VITAMIN D (ERGOCALCIFEROL) 1.25 MG (50000 UNIT) PO CAPS
50000.0000 [IU] | ORAL_CAPSULE | ORAL | Status: DC
  Administered 2020-01-13: 50000 [IU] via ORAL
  Filled 2020-01-12: qty 1

## 2020-01-12 MED ORDER — INSULIN GLARGINE 100 UNIT/ML ~~LOC~~ SOLN
60.0000 [IU] | Freq: Every day | SUBCUTANEOUS | Status: DC
  Filled 2020-01-12: qty 0.6

## 2020-01-12 MED ORDER — HEPARIN SODIUM (PORCINE) 5000 UNIT/ML IJ SOLN
5000.0000 [IU] | Freq: Three times a day (TID) | INTRAMUSCULAR | Status: DC
  Administered 2020-01-12 – 2020-01-18 (×17): 5000 [IU] via SUBCUTANEOUS
  Filled 2020-01-12 (×18): qty 1

## 2020-01-12 MED ORDER — AMLODIPINE BESYLATE 5 MG PO TABS
5.0000 mg | ORAL_TABLET | Freq: Every day | ORAL | Status: DC
  Administered 2020-01-13 – 2020-01-20 (×8): 5 mg via ORAL
  Filled 2020-01-12 (×8): qty 1

## 2020-01-12 MED ORDER — SODIUM CHLORIDE 0.9 % IV SOLN
2.0000 g | INTRAVENOUS | Status: DC
  Administered 2020-01-13: 2 g via INTRAVENOUS
  Filled 2020-01-12: qty 20

## 2020-01-12 MED ORDER — INSULIN ASPART 100 UNIT/ML ~~LOC~~ SOLN
10.0000 [IU] | Freq: Once | SUBCUTANEOUS | Status: AC
  Administered 2020-01-12: 10 [IU] via SUBCUTANEOUS
  Filled 2020-01-12: qty 1

## 2020-01-12 MED ORDER — INSULIN ASPART 100 UNIT/ML ~~LOC~~ SOLN
25.0000 [IU] | Freq: Once | SUBCUTANEOUS | Status: AC
  Administered 2020-01-12: 25 [IU] via SUBCUTANEOUS
  Filled 2020-01-12: qty 1

## 2020-01-12 MED ORDER — TACROLIMUS 1 MG PO CAPS
3.0000 mg | ORAL_CAPSULE | Freq: Two times a day (BID) | ORAL | Status: DC
  Administered 2020-01-12 – 2020-01-20 (×16): 3 mg via ORAL
  Filled 2020-01-12: qty 3
  Filled 2020-01-12 (×4): qty 6
  Filled 2020-01-12: qty 3
  Filled 2020-01-12: qty 6
  Filled 2020-01-12 (×3): qty 3
  Filled 2020-01-12 (×3): qty 6
  Filled 2020-01-12 (×3): qty 3
  Filled 2020-01-12 (×2): qty 6
  Filled 2020-01-12: qty 3

## 2020-01-12 MED ORDER — GLIPIZIDE ER 5 MG PO TB24
5.0000 mg | ORAL_TABLET | Freq: Every day | ORAL | Status: DC
  Filled 2020-01-12: qty 1

## 2020-01-12 MED ORDER — INSULIN ASPART 100 UNIT/ML ~~LOC~~ SOLN
0.0000 [IU] | Freq: Every day | SUBCUTANEOUS | Status: DC
  Administered 2020-01-14: 2 [IU] via SUBCUTANEOUS

## 2020-01-12 MED ORDER — METRONIDAZOLE IN NACL 5-0.79 MG/ML-% IV SOLN
500.0000 mg | Freq: Three times a day (TID) | INTRAVENOUS | Status: DC
  Administered 2020-01-13 (×2): 500 mg via INTRAVENOUS
  Filled 2020-01-12 (×3): qty 100

## 2020-01-12 MED ORDER — INSULIN GLARGINE 100 UNIT/ML ~~LOC~~ SOLN
50.0000 [IU] | Freq: Every day | SUBCUTANEOUS | Status: DC
  Filled 2020-01-12: qty 0.5

## 2020-01-12 NOTE — Progress Notes (Signed)
Subjective: 59 year old male presents the office today for concerns of infection to his right foot which is been ongoing for the last 2 weeks.  He was last seen in our office May 25, 2019.  He states that the last couple weeks he has noticed infection of the wound worsening and he went to [facility] emergency department yesterday but left without being seen due to the wait time.  He presents to the office this morning visibly ill with chills.  He also complains of vomiting.  He has no chest pain, shortness of breath.  Objective: AAO x3 DP/PT pulses palpable bilaterally, CRT less than 3 seconds Ulcerations that are warm with obvious purulence identified.  This was cultured today.  There is edema to the foot and there is warmth.  Edema extends to the leg.  No crepitation. No pain with calf compression, swelling, warmth, erythema  Assessment: Abscess, infection  Plan: -All treatment options discussed with the patient including all alternatives, risks, complications.  -Systemically he appears to be ill and has obvious chills and he had vomiting this morning.  Given the infection I do recommend to go back to the emergency department.  He is going to report to [facility] and we contacted the ER to let them know of his arrival.  Unfortunately I do not have privileges at [facility] in order to see him.  I am happy to participate in his care if needed otherwise and will follow up with him as an outpatient if needed. -Patient encouraged to call the office with any questions, concerns, change in symptoms.   Trula Slade DPM

## 2020-01-12 NOTE — ED Provider Notes (Signed)
[facility] EMERGENCY DEPARTMENT Provider Note   CSN: [id number] Arrival date & time: 01/12/20  1212     History Chief Complaint  Patient presents with   Claudication    Raymond [name] is a 59 y.o. male with a history of chronic kidney disease s/p renal transplant,  diabetes, hypertension currently under the care of Dr. Earleen Newport for a wound on his right plantar foot, was seen by him today and sent here out of concern for worsening foot infection.  Patient denies injury specifically to the foot or skin.  He does have significant pain at the site.  Additionally, there is concern for possible DVT in his right lower extremity as he has noticed pain and swelling and firmness along a vein in his right lower leg.  He does not have a history of DVT but does have significant lower limb ischemia.  Of note, patient was seen at the ED at [facility] yesterday but left prior to getting his imaging study.  Lab tests during that visit revealed a significantly elevated D-dimer at greater than 19.  He denies chest pain, palpitations, dizziness, does endorse mild shortness of breath.  He is on Lantus and glipizide for his diabetes and reports fair control, however was shocked to discover his CBG when tested here is 609.  HPI     Past Medical History:  Diagnosis Date   Chronic kidney disease    Awaiting transplant   Diabetes ([place])    Diabetes mellitus without complication ([place])    Hypertension    Renal disorder     Patient Active Problem List   Diagnosis Date Noted   Critical lower limb ischemia 02/02/2019   Personal history of noncompliance with medical treatment, presenting hazards to health 10/07/2018   Critical limb ischemia with history of revascularization of same extremity 07/27/2018   Mixed hyperlipidemia 02/19/2018   Class 1 obesity due to excess calories with serious comorbidity and body mass index (BMI) of 31.0 to 31.9 in adult 02/19/2018   History of renal transplant  02/19/2018   Bilateral impacted cerumen 08/31/2017   Conductive hearing loss, bilateral 08/31/2017   Nuclear sclerotic cataract of right eye 10/07/2016   Pseudophakia of left eye 10/07/2016   Hypertension due to endocrine disorder 03/04/2016   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 ([place]) 03/04/2016   Renal failure 03/04/2016   Acute kidney injury ([place]) 12/10/2015   Immunosuppression ([place]) 12/10/2015   Nausea vomiting and diarrhea 12/10/2015   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) ([place]) 12/10/2015   Essential hypertension, benign 12/10/2015   ARF (acute renal failure) ([place]) 12/10/2015   Pancytopenia ([place]) 12/10/2015   Encounter for screening colonoscopy 11/15/2014   GERD (gastroesophageal reflux disease) 11/15/2014    Past Surgical History:  Procedure Laterality Date   COLONOSCOPY N/A 12/05/2014   OMV:EHMCNOBS external and internal hemorrhoid/mild diverticulosis/11 polyps removed   ESOPHAGOGASTRODUODENOSCOPY N/A 12/05/2014   JGG:EZMO duodenitis   IR FLUORO GUIDE CV LINE RIGHT  03/01/2019   IR REMOVAL TUN CV CATH W/O FL  05/10/2019   IR US GUIDE VASC ACCESS RIGHT  03/01/2019   KIDNEY TRANSPLANT  03/2015       Family History  Problem Relation Age of Onset   Diabetes Mother    Heart disease Father    Colon cancer Neg Hx     Social History   Tobacco Use   Smoking status: Never Smoker   Smokeless tobacco:  Never Used  Vaping Use   Vaping Use: Never used  Substance Use Topics   Alcohol use: No    Alcohol/week: 0.0 standard drinks   Drug use: No    Home Medications Prior to Admission medications   Medication Sig Start Date End Date Taking? Authorizing Provider  amLODipine (NORVASC) 5 MG tablet Take by mouth.    [provider]  Blood Glucose Monitoring Suppl (ACCU-CHEK GUIDE ME) w/Device KIT 1 Piece by Does not apply route as  directed. 02/18/18   [name], MD  doxazosin (CARDURA) 4 MG tablet Take by mouth. 12/18/15   [provider]  glipiZIDE (GLUCOTROL XL) 5 MG 24 hr tablet Take by mouth.    [provider]  glucose blood (ACCU-CHEK GUIDE) test strip Use as instructed 4 x daily. E11.65 02/19/18   [name], MD  hydrALAZINE (APRESOLINE) 25 MG tablet Take 25 mg by mouth 2 (two) times daily.    [provider]  insulin glargine (LANTUS SOLOSTAR) 100 UNIT/ML Solostar Pen INJECT 60 UNITS SUBCUTANEOUSLY AT BEDTIME 08/09/19   [name], [name], MD  Insulin Pen Needle (B-D ULTRAFINE III SHORT PEN) 31G X 8 MM MISC 1 each by Does not apply route as directed. 02/18/18   [name], MD  Multiple Vitamin (MULTIVITAMIN) capsule Take 1 capsule by mouth daily.    [provider]  sodium bicarbonate 650 MG tablet Take 1,300 mg by mouth 3 (three) times daily. 11/02/18   [provider]  Vitamin D, Ergocalciferol, (DRISDOL) 50000 units CAPS capsule Take 50,000 Units by mouth every 30 (thirty) days.    [provider]    Allergies    Patient has no known allergies.  Review of Systems   Review of Systems  Constitutional: Negative for chills and fever.  HENT: Negative for congestion and sore throat.   Eyes: Negative.   Respiratory: Positive for shortness of breath. Negative for chest tightness.   Cardiovascular: Negative for chest pain.  Gastrointestinal: Negative for abdominal pain, nausea and vomiting.  Genitourinary: Negative.   Musculoskeletal: Positive for arthralgias. Negative for joint swelling and neck pain.  Skin: Positive for color change and wound. Negative for rash.  Neurological: Negative for dizziness, weakness, light-headedness, numbness and headaches.  Psychiatric/Behavioral: Negative.     Physical Exam Updated Vital Signs BP 116/76 (BP Location: Right Arm)    Pulse 76    Temp 98 F (36.7 C) (Oral)    Resp 16    Ht _0   (1.753 m)    Wt 97.1 kg    SpO2 99%    BMI 31.60 kg/m   Physical Exam Vitals and nursing note reviewed.  Constitutional:      Appearance: He is well-developed.  HENT:     Head: Normocephalic and atraumatic.  Eyes:     Conjunctiva/sclera: Conjunctivae normal.  Cardiovascular:     Rate and Rhythm: Normal rate and regular rhythm.     Pulses:          Dorsalis pedis pulses are 1+ on the right side and 1+ on the left side.     Heart sounds: Normal heart sounds.     Comments: Erythema also noted right anterior tibia. Superficial palpable tender vein right medial tibia.   Pulmonary:     Effort: Pulmonary effort is normal.     Breath sounds: Normal breath sounds. No wheezing.  Abdominal:     General: Abdomen is protuberant. Bowel sounds are normal.  Palpations: Abdomen is soft.     Tenderness: There is no abdominal tenderness. There is no guarding.  Musculoskeletal:        General: Normal range of motion.     Cervical back: Normal range of motion.     Right lower leg: Edema present.  Feet:     Right foot:     Skin integrity: Skin breakdown and erythema present.     Comments: Draining purulent wound right foot plantar metarsals.  Skin:    General: Skin is warm and dry.  Neurological:     Mental Status: He is alert.     ED Results / Procedures / Treatments   Labs (all labs ordered are listed, but only abnormal results are displayed) Labs Reviewed  BASIC METABOLIC PANEL - Abnormal; Notable for the following components:      Result Value   Sodium 128 (*)    Chloride 96 (*)    CO2 21 (*)    Glucose, Bld 609 (*)    BUN 55 (*)    Creatinine, Ser 2.10 (*)    GFR calc non Af Amer 33 (*)    GFR calc Af Amer 39 (*)    All other components within normal limits  CBC WITH DIFFERENTIAL/PLATELET - Abnormal; Notable for the following components:   MCH 25.8 (*)    Platelets 81 (*)    Lymphs Abs 0.1 (*)    All other components within normal limits  CBG MONITORING, ED - Abnormal;  Notable for the following components:   Glucose-Capillary >600 (*)    All other components within normal limits  CBG MONITORING, ED - Abnormal; Notable for the following components:   Glucose-Capillary >600 (*)    All other components within normal limits  SARS CORONAVIRUS 2 BY RT PCR [facility] ORDER, [place] LAB)    EKG EKG Interpretation  Date/Time:  Thursday January 12 2020 12:53:35 EDT Ventricular Rate:  76 PR Interval:  166 QRS Duration: 86 QT Interval:  376 QTC Calculation: 423 R Axis:   20 Text Interpretation: Normal sinus rhythm Normal ECG No old tracing to compare Confirmed by Dorie Rank [phone]) on 01/12/2020 3:43:26 PM   Radiology US Venous Img Lower Unilateral Right (DVT)  Result Date: 01/12/2020 CLINICAL DATA:  Calf and popliteal fossa pain x1 week, edema, redness EXAM: RIGHT LOWER EXTREMITY VENOUS DOPPLER ULTRASOUND TECHNIQUE: Gray-scale sonography with compression, as well as color and duplex ultrasound, were performed to evaluate the deep venous system(s) from the level of the common femoral vein through the popliteal and proximal calf veins. COMPARISON:  None. FINDINGS: VENOUS Normal compressibility of the common femoral, superficial femoral, and popliteal veins, as well as the visualized calf veins. Visualized portions of profunda femoral vein and great saphenous vein unremarkable. No filling defects to suggest DVT on grayscale or color Doppler imaging. Doppler waveforms show normal direction of venous flow, normal respiratory phasicity and response to augmentation. Limited views of the contralateral common femoral vein are unremarkable. OTHER Hypoechoic thrombus in a noncompressible right short saphenous vein, and in the great saphenous vein below the knee. Hypoechoic right inguinal lymph node 1.1 cm short axis diameter. Limitations: none IMPRESSION: 1. Negative for DVT. 2. Superficial thrombophlebitis involving right short saphenous vein, and right  GSV below the knee. Electronically Signed   By: Lucrezia Europe M.D.   On: 01/12/2020 16:11   DG Foot Complete Right  Result Date: 01/12/2020 CLINICAL DATA:  Diabetic foot infection. EXAM: RIGHT FOOT COMPLETE -  3+ VIEW COMPARISON:  April 05, 2019 FINDINGS: There is no evidence of an acute fracture or dislocation. A chronic fracture of the proximal phalanx of the fifth right toe is noted. There is a large plantar calcaneal spur. Moderate severity degenerative changes are seen involving the metatarsophalangeal joint of the right great toe. Moderate to marked severity vascular calcification is seen. A mild amount of soft tissue air is seen along the plantar aspect of the distal right foot. Mild to moderate severity associated soft tissue swelling is seen. IMPRESSION: 1. Mild amount of soft tissue air along the plantar aspect of the distal right foot. 2. Chronic and degenerative changes, as described above. Electronically Signed   By: Virgina Norfolk M.D.   On: 01/12/2020 16:35    Procedures Procedures (including critical care time)  Medications Ordered in ED Medications  vancomycin (VANCOCIN) IVPB 1000 mg/200 mL premix (has no administration in time range)  sodium chloride 0.9 % bolus 1,000 mL (1,000 mLs Intravenous New Bag/Given 01/12/20 1621)  insulin aspart (novoLOG) injection 10 Units (10 Units Subcutaneous Given 01/12/20 1634)    ED Course  I have reviewed the triage vital signs and the nursing notes.  Pertinent labs & imaging results that were available during my care of the patient were reviewed by me and considered in my medical decision making (see chart for details).    MDM Rules/Calculators/A&P                          Patient with significant hyperglycemia without DKA, ultrasound revealing for superficial thrombophlebitis of the right lower extremity, diabetic foot infection with purulent drainage from an infection of his right foot.  He was given IV fluids along with subcu insulin,  vancomycin ordered to start treatment of this diabetic foot infection.  He will require admission for additional IV abx.    Discussed with Dr. Waldron Labs who accepts care of pt.   Final Clinical Impression(s) / ED Diagnoses Final diagnoses:  Diabetic foot infection ([place])  Hyperglycemia  Thrombophlebitis of superficial veins of right lower extremity    Rx / DC Orders ED Discharge Orders    None       Landis Martins 01/12/20 1739    Dorie Rank, MD 01/13/20 [id number]

## 2020-01-12 NOTE — Progress Notes (Addendum)
Pharmacy Antibiotic Note  Raymond [name] is a 59 y.o. male admitted on 01/12/2020 with  diabetic foot infection.  Pharmacy has been consulted for Daptomycin dosing for known past history of ostemyelitis treated with daptomycin.  Scr 2.1, estimated CrCl 43.6 ml/min.  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  Weekly CK Monitor clinical status, renal function and culture results daily.     Height: 5\' 9"  (175.3 cm) Weight: 97.1 kg (214 lb) IBW/kg (Calculated) : 70.7  Adjusted body weight: 81.3 kg  Temp (24hrs), Avg:98 F (36.7 C), Min:98 F (36.7 C), Max:98 F (36.7 C)  Recent Labs  Lab 01/12/20 1328  WBC 6.0  CREATININE 2.10*    Estimated Creatinine Clearance: 43.6 mL/min (A) (by C-G formula based on SCr of 2.1 mg/dL (H)).    No Known Allergies  Antimicrobials this admission: daptomycin 8/19>>  Dose adjustments this admission:   Microbiology results:  Thank you for allowing pharmacy to be a part of this patient's care.  Nicole Cella, RPh Clinical Pharmacist  01/12/2020 6:28 PM

## 2020-01-12 NOTE — ED Triage Notes (Signed)
Pt c/o of shortness of breath that began yesterday. Pt states he was seen by his primary doctor today and sent for an evaluation for a blood clot in his right calf.

## 2020-01-12 NOTE — H&P (Signed)
TRH H&P   Patient Demographics:    Raymond [name], is a 59 y.o. male  MRN: [id number]   DOB - Feb 04, 1961  Admit Date - 01/12/2020  Outpatient Primary MD for the patient is [name], [place]  Referring MD/NP/PA: [name]  Patient coming from: Podiatry office  Chief Complaint  Patient presents with  . Claudication      HPI:    Raymond [name]  is a 59 y.o. 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, it was sent by podiatry for evaluation for wound in the right plantar foot, it was seen today by podiatry, and he had a concern of worsening foot infection, so he was sent to ED for further evaluation, patient reports symptoms has been going on for last 2 weeks, and currently he is with purulent foul-smelling odor, as well there was a concern for possible DVT in his right lower extremity given swelling, patient was at [facility] ED yesterday, and he left prior to getting his imaging study done, labs during that visit showing elevated D-dimers greater than 19, patient himself denies any chest pain, palpitation, dizziness, he does report mild dyspnea, report he has been compliant with his Lantus and glipizide. - in ED work-up significant for pseudohyponatremia sodium of 128, creatinine of 2.1 from baseline 1.3, glucose of 609, but normal anion gap, and bicarb of 21, had low platelet at 81, negative, he is unvaccinated, started on vancomycin, [facility] hospitalist consulted to admit.    Review of systems:    In addition to the HPI above,  No Fever-chills, No Headache, No changes with Vision or hearing, No problems swallowing food or Liquids, No Chest pain, Cough, he does report mild dyspnea. No Abdominal pain, No Nausea or Vommitting, Bowel movements are  regular, No Blood in stool or Urine, No dysuria,  report wound with discharge from plantar surface of right foot No new joints pains-aches,  No new weakness, tingling, numbness in any extremity, No recent weight gain or loss, No polyuria, polydypsia or polyphagia, No significant Mental Stressors.  A full 10 point Review of Systems was done, except as stated above, all other Review of Systems were negative.   With Past History of the following :    Past Medical History:  Diagnosis Date  . Chronic kidney disease    Awaiting transplant  . Diabetes ([place])   . Diabetes mellitus without complication ([place])   . Hypertension   . Renal disorder       Past Surgical History:  Procedure Laterality Date  . COLONOSCOPY N/A 12/05/2014   WUJ:[id number] external and internal hemorrhoid/mild diverticulosis/11 polyps removed  . ESOPHAGOGASTRODUODENOSCOPY N/A 12/05/2014   GNF:AOZH duodenitis  . IR FLUORO GUIDE CV LINE RIGHT  03/01/2019  . IR REMOVAL TUN CV CATH W/O FL  05/10/2019  .  IR US GUIDE VASC ACCESS RIGHT  03/01/2019  . KIDNEY TRANSPLANT  03/2015      Social History:     Social History   Tobacco Use  . Smoking status: Never Smoker  . Smokeless tobacco: Never Used  Substance Use Topics  . Alcohol use: No    Alcohol/week: 0.0 standard drinks      Family History :     Family History  Problem Relation Age of Onset  . Diabetes Mother   . Heart disease Father   . Colon cancer Neg Hx     Home Medications:   Prior to Admission medications   Medication Sig Start Date End Date Taking? Authorizing Provider  amLODipine (NORVASC) 5 MG tablet Take by mouth.    [provider]  Blood Glucose Monitoring Suppl (ACCU-CHEK GUIDE ME) w/Device KIT 1 Piece by Does not apply route as directed. 02/18/18   [name], MD  doxazosin (CARDURA) 4 MG tablet Take by mouth. 12/18/15   [provider]  glipiZIDE (GLUCOTROL XL) 5 MG 24 hr tablet Take by mouth.    [provider]  glucose blood (ACCU-CHEK GUIDE) test strip Use as instructed 4 x daily. E11.65 02/19/18   [name], MD  hydrALAZINE (APRESOLINE) 25 MG tablet Take 25 mg by mouth 2 (two) times daily.    [provider]  insulin glargine (LANTUS SOLOSTAR) 100 UNIT/ML Solostar Pen INJECT 60 UNITS SUBCUTANEOUSLY AT BEDTIME 08/09/19   [name], [name], MD  Insulin Pen Needle (B-D ULTRAFINE III SHORT PEN) 31G X 8 MM MISC 1 each by Does not apply route as directed. 02/18/18   [name], MD  Multiple Vitamin (MULTIVITAMIN) capsule Take 1 capsule by mouth daily.    [provider]  sodium bicarbonate 650 MG tablet Take 1,300 mg by mouth 3 (three) times daily. 11/02/18   [provider]  Vitamin D, Ergocalciferol, (DRISDOL) 50000 units CAPS capsule Take 50,000 Units by mouth every 30 (thirty) days.    [provider]     Allergies:    No Known Allergies   Physical Exam:   Vitals  Blood pressure 103/61, pulse 76, temperature 98 F (36.7 C), temperature source Oral, resp. rate 16, height '5\' 9"'  (1.753 m), weight 97.1 kg, SpO2 99 %.   1. General alert male, laying in bed, no apparent distress  2. Normal affect and insight, Not Suicidal or Homicidal, Awake Alert, Oriented X 3.  3. No F.N deficits, ALL C.Nerves Intact, Strength 5/5 all 4 extremities, Sensation intact all 4 extremities, Plantars down going.  4. Ears and Eyes appear Normal, Conjunctivae clear, PERRLA. Moist Oral Mucosa.  5. Supple Neck, No JVD, No cervical lymphadenopathy appriciated, No Carotid Bruits.  6. Symmetrical Chest wall movement, Good air movement bilaterally, CTAB.  7. RRR, No Gallops, Rubs or Murmurs, No Parasternal Heave.  8. Positive Bowel Sounds, Abdomen Soft, No tenderness, No organomegaly appriciated,No rebound -guarding or rigidity.  9.  No Cyanosis, Normal Skin Turgor, right foot plantar surface ulcer with purulent discharge, please see picture    10. Good muscle tone,  joints appear normal , no effusions, Normal ROM.  11. No Palpable Lymph Nodes in Neck or Axillaee   below.     Data Review:    CBC Recent Labs  Lab 01/12/20 1328  WBC 6.0  HGB 14.1  HCT 45.2  PLT 81*  MCV 82.8  MCH 25.8*  MCHC 31.2  RDW 14.1  LYMPHSABS 0.1*  MONOABS 0.8  EOSABS 0.0  BASOSABS 0.0   ------------------------------------------------------------------------------------------------------------------  Chemistries  Recent Labs  Lab 01/12/20 1328  NA 128*  K 4.7  CL 96*  CO2 21*  GLUCOSE 609*  BUN 55*  CREATININE 2.10*  CALCIUM 9.7   ------------------------------------------------------------------------------------------------------------------ estimated creatinine clearance is 43.6 mL/min (A) (by C-G formula based on SCr of 2.1 mg/dL (H)). ------------------------------------------------------------------------------------------------------------------ No results for input(s): TSH, T4TOTAL, T3FREE, THYROIDAB in the last 72 hours.  Invalid input(s): FREET3  Coagulation profile No results for input(s): INR, PROTIME in the last 168 hours. ------------------------------------------------------------------------------------------------------------------- No results for input(s): DDIMER in the last 72 hours. -------------------------------------------------------------------------------------------------------------------  Cardiac Enzymes No results for input(s): CKMB, TROPONINI, MYOGLOBIN in the last 168 hours.  Invalid input(s): CK ------------------------------------------------------------------------------------------------------------------ No results found for: BNP   ---------------------------------------------------------------------------------------------------------------  Urinalysis    Component Value Date/Time   COLORURINE YELLOW (A) 09/17/2018 0910   APPEARANCEUR CLEAR (A) 09/17/2018 0910   LABSPEC  1.028 09/17/2018 0910   PHURINE 5.0 09/17/2018 0910   GLUCOSEU 150 (A) 09/17/2018 0910   HGBUR NEGATIVE 09/17/2018 0910   BILIRUBINUR negative 12/12/2019 1715   KETONESUR NEGATIVE 09/17/2018 0910   PROTEINUR Positive (A) 12/12/2019 1715   PROTEINUR 30 (A) 09/17/2018 0910   UROBILINOGEN 0.2 12/12/2019 1715   NITRITE negative 12/12/2019 1715   NITRITE NEGATIVE 09/17/2018 0910   LEUKOCYTESUR Negative 12/12/2019 1715   LEUKOCYTESUR NEGATIVE 09/17/2018 0910    ----------------------------------------------------------------------------------------------------------------   Imaging Results:    US Venous Img Lower Unilateral Right (DVT)  Result Date: 01/12/2020 CLINICAL DATA:  Calf and popliteal fossa pain x1 week, edema, redness EXAM: RIGHT LOWER EXTREMITY VENOUS DOPPLER ULTRASOUND TECHNIQUE: Gray-scale sonography with compression, as well as color and duplex ultrasound, were performed to evaluate the deep venous system(s) from the level of the common femoral vein through the popliteal and proximal calf veins. COMPARISON:  None. FINDINGS: VENOUS Normal compressibility of the common femoral, superficial femoral, and popliteal veins, as well as the visualized calf veins. Visualized portions of profunda femoral vein and great saphenous vein unremarkable. No filling defects to suggest DVT on grayscale or color Doppler imaging. Doppler waveforms show normal direction of venous flow, normal respiratory phasicity and response to augmentation. Limited views of the contralateral common femoral vein are unremarkable. OTHER Hypoechoic thrombus in a noncompressible right short saphenous vein, and in the great saphenous vein below the knee. Hypoechoic right inguinal lymph node 1.1 cm short axis diameter. Limitations: none IMPRESSION: 1. Negative for DVT. 2. Superficial thrombophlebitis involving right short saphenous vein, and right GSV below the knee. Electronically Signed   By: Lucrezia Europe M.D.   On: 01/12/2020  16:11   DG Foot Complete Right  Result Date: 01/12/2020 CLINICAL DATA:  Diabetic foot infection. EXAM: RIGHT FOOT COMPLETE - 3+ VIEW COMPARISON:  April 05, 2019 FINDINGS: There is no evidence of an acute fracture or dislocation. A chronic fracture of the proximal phalanx of the fifth right toe is noted. There is a large plantar calcaneal spur. Moderate severity degenerative changes are seen involving the metatarsophalangeal joint of the right great toe. Moderate to marked severity vascular calcification is seen. A mild amount of soft tissue air is seen along the plantar aspect of the distal right foot. Mild to moderate severity associated soft tissue swelling is seen. IMPRESSION: 1. Mild amount of soft tissue air along the plantar aspect of the distal right foot. 2. Chronic and degenerative changes, as described above. Electronically Signed   By: Virgina Norfolk M.D.   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 -If there is any abscess present on imaging, then will consult general  surgery -We will obtain ABI  Elevated D-dimers -This is most likely related to thrombophlebitis as evident on vascular Doppler, no evidence for DVT, no indication for anticoagulation. -Obtain VQ scan to rule out PE even though low clinical suspicion, but D-dimer significantly elevated at 19 at [facility] ED  Hyperglycemia, poorly controlled diabetes mellitus, insulin-dependent -Does not appear to be in DKA or HONC. -We will give him his his Lantus dose right now of 50 units, will start insulin sliding scale and IV fluids -Continue with home dose glipizide XL -Check A1c  AKI in a renal transplant patient -Continue with IV fluids, resume Prograf. -If no improvement with IV fluids then will obtain renal transplant ultrasound.  Hypertension -Continue with home medications  Thrombocytopenia -Most likely due to infection, monitor closely  DVT Prophylaxis subcu heparin  AM Labs Ordered, also please review Full Orders  Family Communication: Admission, patients condition and plan of care including tests being ordered have been discussed with the patient who indicate understanding and agree with the plan and Code Status.  Code Status Full  Likely DC to home  Condition GUARDED    Consults called:  none  Admission status:  inpatient  Time spent in minutes :60 minutes   Phillips Climes M.D on 01/12/2020 at 6:32 PM   [facility] - Office  [phone]

## 2020-01-12 NOTE — ED Notes (Signed)
Date and time results received: 01/12/20 1:49 PM   Test: Blood Glucose Critical Value: 609  Name of Provider Notified: Evalee Jefferson  Orders Received? Or Actions Taken? See orders.

## 2020-01-13 ENCOUNTER — Inpatient Hospital Stay (HOSPITAL_COMMUNITY): Payer: BC Managed Care – PPO

## 2020-01-13 ENCOUNTER — Encounter (HOSPITAL_COMMUNITY): Payer: Self-pay | Admitting: Internal Medicine

## 2020-01-13 DIAGNOSIS — Z6831 Body mass index (BMI) 31.0-31.9, adult: Secondary | ICD-10-CM

## 2020-01-13 DIAGNOSIS — N186 End stage renal disease: Secondary | ICD-10-CM

## 2020-01-13 DIAGNOSIS — I8001 Phlebitis and thrombophlebitis of superficial vessels of right lower extremity: Secondary | ICD-10-CM

## 2020-01-13 DIAGNOSIS — E6609 Other obesity due to excess calories: Secondary | ICD-10-CM

## 2020-01-13 DIAGNOSIS — E1165 Type 2 diabetes mellitus with hyperglycemia: Secondary | ICD-10-CM

## 2020-01-13 DIAGNOSIS — E1122 Type 2 diabetes mellitus with diabetic chronic kidney disease: Secondary | ICD-10-CM

## 2020-01-13 LAB — BLOOD CULTURE ID PANEL (REFLEXED) - BCID2

## 2020-01-13 LAB — CBC
HCT: 37 % — ABNORMAL LOW (ref 39.0–52.0)
Hemoglobin: 11.3 g/dL — ABNORMAL LOW (ref 13.0–17.0)
MCH: 25.5 pg — ABNORMAL LOW (ref 26.0–34.0)
MCHC: 30.5 g/dL (ref 30.0–36.0)
MCV: 83.5 fL (ref 80.0–100.0)
Platelets: 66 10*3/uL — ABNORMAL LOW (ref 150–400)
RBC: 4.43 MIL/uL (ref 4.22–5.81)
RDW: 14 % (ref 11.5–15.5)
WBC: 8.4 10*3/uL (ref 4.0–10.5)
nRBC: 0 % (ref 0.0–0.2)

## 2020-01-13 LAB — COMPREHENSIVE METABOLIC PANEL
ALT: 17 U/L (ref 0–44)
AST: 15 U/L (ref 15–41)
Albumin: 2.5 g/dL — ABNORMAL LOW (ref 3.5–5.0)
Alkaline Phosphatase: 97 U/L (ref 38–126)
Anion gap: 9 (ref 5–15)
BUN: 60 mg/dL — ABNORMAL HIGH (ref 6–20)
CO2: 18 mmol/L — ABNORMAL LOW (ref 22–32)
Calcium: 8.4 mg/dL — ABNORMAL LOW (ref 8.9–10.3)
Chloride: 109 mmol/L (ref 98–111)
Creatinine, Ser: 1.71 mg/dL — ABNORMAL HIGH (ref 0.61–1.24)
GFR calc Af Amer: 50 mL/min — ABNORMAL LOW (ref >60)
GFR calc non Af Amer: 43 mL/min — ABNORMAL LOW (ref >60)
Glucose, Bld: 166 mg/dL — ABNORMAL HIGH (ref 70–99)
Potassium: 3.8 mmol/L (ref 3.5–5.1)
Sodium: 136 mmol/L (ref 135–145)
Total Bilirubin: 0.8 mg/dL (ref 0.3–1.2)
Total Protein: 6.1 g/dL — ABNORMAL LOW (ref 6.5–8.1)

## 2020-01-13 LAB — GLUCOSE, CAPILLARY
Glucose-Capillary: >600 mg/dL (ref 70–99)
Glucose-Capillary: 135 mg/dL — ABNORMAL HIGH (ref 70–99)
Glucose-Capillary: 221 mg/dL — ABNORMAL HIGH (ref 70–99)
Glucose-Capillary: 194 mg/dL — ABNORMAL HIGH (ref 70–99)
Glucose-Capillary: 143 mg/dL — ABNORMAL HIGH (ref 70–99)

## 2020-01-13 LAB — CK: Total CK: 30 U/L — ABNORMAL LOW (ref 49–397)

## 2020-01-13 MED ORDER — TECHNETIUM TO 99M ALBUMIN AGGREGATED
4.0000 | Freq: Once | INTRAVENOUS | Status: AC | PRN
  Administered 2020-01-13: 3.8 via INTRAVENOUS

## 2020-01-13 MED ORDER — SODIUM CHLORIDE 0.9 % IV SOLN
650.0000 mg | INTRAVENOUS | Status: DC
  Administered 2020-01-13 – 2020-01-19 (×7): 650 mg via INTRAVENOUS
  Filled 2020-01-13 (×10): qty 13

## 2020-01-13 MED ORDER — LIVING WELL WITH DIABETES BOOK: Freq: Once | Status: AC

## 2020-01-13 MED ORDER — TRAMADOL HCL 50 MG PO TABS
50.0000 mg | ORAL_TABLET | Freq: Once | ORAL | Status: AC
  Administered 2020-01-13: 50 mg via ORAL
  Filled 2020-01-13: qty 1

## 2020-01-13 MED ORDER — MORPHINE SULFATE (PF) 2 MG/ML IV SOLN
2.0000 mg | Freq: Once | INTRAVENOUS | Status: AC
  Administered 2020-01-13: 2 mg via INTRAVENOUS
  Filled 2020-01-13: qty 1

## 2020-01-13 MED ORDER — INSULIN GLARGINE 100 UNIT/ML ~~LOC~~ SOLN
30.0000 [IU] | Freq: Two times a day (BID) | SUBCUTANEOUS | Status: DC
  Administered 2020-01-13 – 2020-01-20 (×15): 30 [IU] via SUBCUTANEOUS
  Filled 2020-01-13 (×22): qty 0.3

## 2020-01-13 MED ORDER — DAPTOMYCIN 500 MG IV SOLR
INTRAVENOUS | Status: AC
  Filled 2020-01-13: qty 20

## 2020-01-13 MED ORDER — INSULIN ASPART 100 UNIT/ML ~~LOC~~ SOLN
4.0000 [IU] | Freq: Three times a day (TID) | SUBCUTANEOUS | Status: DC
  Administered 2020-01-13 – 2020-01-19 (×19): 4 [IU] via SUBCUTANEOUS

## 2020-01-13 NOTE — Consult Note (Addendum)
[place] Nurse Consult Note: Consult requested for topical treatment recommendations of the right foot wound.  Pt has been followed by  Dr Jacqualyn Posey of the podiatry service prior to admission for a chronic full thickness wound to right plantar foot.  Consult performed remotely after review of photo in the EMR and progress notes. Generalized edema and erythema surrounding the full thickness wound, which has yellow moist wound bed and mod amt thick tan pus.  MRI and ABI results are pending. Dressing procedure/placement/frequency: Topical treatment orders provided for bedside nurses to perform as follows to adsorb drainage and provide antimicrobial benefits: Apply piece of Aquacel Kellie Simmering # [id number]) to right foot wound Q day, then cover with 4X4 and kerlex. CT scan indicates, "Ulcer base is contiguous with a fluid and air containing collection measuring 2.1 x 0.8 x 1.8 cm suggesting a developing abscess." This complex medical condition is beyond the scope of practice for [facility] nurses. Please refer to the podiatry team for further recommendations and plan of care.  Please re-consult if further assistance is needed.  Thank-you,  Julien Girt MSN, [place], [place], [place], [place]

## 2020-01-13 NOTE — Progress Notes (Signed)
MD notified of positive blood cultures X4 bottles.

## 2020-01-13 NOTE — Progress Notes (Signed)
MD notified of pt reported pain to R foot 9/10 no PRN orders

## 2020-01-13 NOTE — Progress Notes (Signed)
Inpatient Diabetes Program Recommendations  AACE/ADA: New Consensus Statement on Inpatient Glycemic Control (2015)  Target Ranges:  Prepandial:   less than 140 mg/dL      Peak postprandial:   less than 180 mg/dL (1-2 hours)      Critically ill patients:  140 - 180 mg/dL   Lab Results  Component Value Date   GLUCAP 135 (H) 01/13/2020   HGBA1C 13.7 (H) 01/12/2020    Review of Glycemic Control  Diabetes history: DM 2, Sees Dr. Dorris Fetch (last visit 07/2019) Outpatient Diabetes medications: Lantus 60 units qhs, Glipizide 5 mg Daily mg bid Current orders for Inpatient glycemic control:  Latus 30 units bid Novolog 0-15 units tid + hs  Inpatient Diabetes Program Recommendations:    A1c 13.7% unchanged from 13.3% on 7/19 PCP visit. PT not checking glucose at home. Pt not wanting to take short acting insulin at hoe. Pt may not need home dose of basal insulin at this time.  Pt will need close follow up with Dr. Dorris Fetch and will need to be consistent in checking glucose levels out patient.   -would benefit from ambulatory referral for outpatient diabetes education.  Attempted to call pt and Nurses station no answer yet. Will try again.  0946 am:  Spoke with pt over the phone regarding A1c and importance of glucose checks at home. Pt is more motivated to monitor glucose more frequently and follow up with Dr. Dorris Fetch. Gave pt Dr. Liliane Channel number to call for follow up. Pt will touch base with his wife to see what prescriptions he may need at time of d/c. Pt would like some information on food choices. Discussed importance of glucose control at home and protecting the kidney transplant he received a hand full of years ago.  Thanks,  Tama Headings RN, MSN, BC-ADM Inpatient Diabetes Coordinator Team Pager [phone] (8a-5p)

## 2020-01-13 NOTE — Progress Notes (Signed)
Initial Nutrition Assessment  DOCUMENTATION CODES:   Obesity unspecified  INTERVENTION:  -1 packet Juven BID, each packet provides 95 calories, 2.5 grams of protein (collagen), to support wound healing.   -CHO modified diet education   NUTRITION DIAGNOSIS:  Increased nutrient needs related to wound healing as evidenced by estimated needs.   GOAL:  Pt to meet >/= 90% of their estimated nutrition needs     MONITOR:  PO intake, Supplement acceptance, Weight trends, Labs, Skin   REASON FOR ASSESSMENT:   Consult Wound healing  ASSESSMENT: Patient is a 59 yo male with hx of Diabetes, Renal insufficiency, hx of renal transplant, (ESRD w/HD for 6 yrs per pt) and Hypertension. Presents with diabetic wound to right plantar aspect.  Patient endorses regular meal intake. His routine is breakfast out -bacon and eggs black coffee. Lunch is also take out - frequently [employer].  His wife cooks dinner most days protein and veggies. Patient says he is not usually a bread eater and limits sugary beverages.   He has Living Well With Diabetes booklet and we talked about healthy lunch options at [facility] and suggestions for snacks.    Weight: 97-103 kg weight range the past 8 months. Patient has been working to decrease intake of sugary beverages which is likely contributing factor. Weight loss desirable given his obese state.   Medications reviewed and include: Prograf, Insulin, Lantus, Renal vitamin and vitamin D.   Labs: 8/19- A1C- 13.7%- 7/19-13.3% and  >14% in January 2020 BMP Latest Ref Rng & Units 01/13/2020 01/12/2020 12/12/2019  Glucose 70 - 99 mg/dL 166(H) 609(HH) 369(H)  BUN 6 - 20 mg/dL 60(H) 55(H) 28(H)  Creatinine 0.61 - 1.24 mg/dL 1.71(H) 2.10(H) 1.34(H)  BUN/Creat Ratio 9 - 20 - - 21(H)  Sodium 135 - 145 mmol/L 136 128(L) 138  Potassium 3.5 - 5.1 mmol/L 3.8 4.7 4.8  Chloride 98 - 111 mmol/L 109 96(L) 104  CO2 22 - 32 mmol/L 18(L) 21(L) 22  Calcium 8.9 - 10.3 mg/dL 8.4(L) 9.7 9.8      NUTRITION - FOCUSED PHYSICAL EXAM: Nutrition-Focused physical exam completed. Findings are no fat depletion, no muscle depletion.     Diet Order:   Diet Order            Diet Carb Modified Fluid consistency: Thin; Room service appropriate? Yes  Diet effective now                 EDUCATION NEEDS:  Education needs have been addressed   Skin:   diabetic wound to right foot (plantar aspect)-purulent discharge   Last BM:  8/19  Height:   Ht Readings from Last 1 Encounters:  01/12/20 5\' 9"  (1.753 m)    Weight:   Wt Readings from Last 1 Encounters:  01/12/20 97.1 kg    Ideal Body Weight:   73 kg  BMI:  Body mass index is 31.6 kg/m.  Estimated Nutritional Needs:   Kcal:  2200-2400  Protein:  95-102 gr  Fluid:  2.2-2.4 liters daily   Colman Cater MS,RD,CSG,LDN Pager: Shea [name]

## 2020-01-13 NOTE — Progress Notes (Signed)
Pharmacy Antibiotic Note  Raymond [name] is a 59 y.o. male admitted on 01/12/2020 with  diabetic foot infection.  Pharmacy was consulted for Daptomycin dosing for known past history of ostemyelitis treated with daptomycin.  Now re-consulted for MRSA bacteremia.  PMH: CKD s/p renal transplant 03/2015, DM, HTN , h/o osteomyelitis 02/2019 received daptomycin, HLD, critical limb ischemia, h/o revascularization of limb, immunosuppression, pancytopenia, GERD   Plan: Continue Daptomycin 8mg /kg q24 hours = 650 mg IV every 24 hours (using adjusted body weight 81.3 kg)  Monitor renal function and adjust dosage accordingly.  Weekly CK Monitor clinical status, renal function and culture results daily.     Height: 5\' 9"  (175.3 cm) Weight: 97.1 kg (214 lb) IBW/kg (Calculated) : 70.7  Adjusted body weight: 81.3 kg  Temp (24hrs), Avg:98.5 F (36.9 C), Min:98.3 F (36.8 C), Max:98.9 F (37.2 C)  Recent Labs  Lab 01/12/20 1328 01/13/20 0722  WBC 6.0 8.4  CREATININE 2.10* 1.71*    Estimated Creatinine Clearance: 53.5 mL/min (A) (by C-G formula based on SCr of 1.71 mg/dL (H)).    No Known Allergies  Antimicrobials this admission: daptomycin 8/19>>  Microbiology results: 8/19 BCx: gpc 8/19 BCID: Staph aureus, Meth resistant mecA/C and MREJ detected  Thank you for allowing pharmacy to be a part of this patient's care.  Hanish Laraia P. Legrand Como, PharmD, [place] Please utilize Amion for appropriate phone number to reach the unit pharmacist ([place]) 01/13/2020 8:52 PM

## 2020-01-13 NOTE — Progress Notes (Signed)
CRITICAL VALUE ALERT  Critical Value:  Anaerobic and Aerobic gram + cocci BCID-Staphlococcus aureus  Meth resistant mecA/C MREJ Date & Time Notied:  8/20 @ 2010  Provider Notified: [place]  Orders Received/Actions taken: awaiting orders

## 2020-01-13 NOTE — Progress Notes (Signed)
CRITICAL VALUE ALERT  Critical Value:  Anaerobic bottle - gram + cocci Anaerobic and Aerobic bottle - gram + cocci  Date & Time Notied:  01/13/2020, 1103  Provider Notified: [place]  Orders Received/Actions taken: Awaiting orders

## 2020-01-13 NOTE — Progress Notes (Signed)
PROGRESS NOTE    Raymond [name]  UVO:[id number] DOB: [birth year] DOA: 01/12/2020 PCP: [name], FNP    Chief Complaint  Patient presents with  .  Right diabetic foot ulcer.    Brief Narrative:  As per H&P written by Dr. Waldron Labs on 01/12/2020  Raymond [name]  is a 59 y.o. 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, it was sent by podiatry for evaluation for wound in the right plantar foot, it was seen today by podiatry, and he had a concern of worsening foot infection, so he was sent to ED for further evaluation, patient reports symptoms has been going on for last 2 weeks, and currently he is with purulent foul-smelling odor, as well there was a concern for possible DVT in his right lower extremity given swelling, patient was at [facility] ED yesterday, and he left prior to getting his imaging study done, labs during that visit showing elevated D-dimers greater than 19, patient himself denies any chest pain, palpitation, dizziness, he does report mild dyspnea, report he has been compliant with his Lantus and glipizide. - in ED work-up significant for pseudohyponatremia sodium of 128, creatinine of 2.1 from baseline 1.3, glucose of 609, but normal anion gap, and bicarb of 21, had low platelet at 81, negative, he is unvaccinated, started on vancomycin, [facility] hospitalist consulted to admit.   Assessment & Plan: 1-right diabetic foot ulcer: Plantar aspect  -Open wound with purulent discharge -Present for the last 2 weeks and worsening -No tenderness secondary to neuropathy -High risk for becoming diffusely infected as patient has underlying history immunosuppressant due to renal transplant status.   -Continue current antibiotics -MRI suggesting developing abscess and osteonecrosis; general surgery has been consulted -Will follow  recommendations. -Continue wound care  2-Acute kidney injury on chronic kidney disease; patient with prior history of renal transplant -Remain in a stage IIIa renal failure. -Continue minimizing nephrotoxic agents -Follow renal function trend -Gentle fluid resuscitation provided. -Continue Prograf.  3-Immunosuppression ([place]) -Continue Prograf chronically -History of renal transplant.  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 -Follow CBGs -Modified carbohydrate diet has been encouraged.  5-Essential hypertension, benign -Stable overall -Continue current antihypertensive regimen. -Follow vital signs.  6-Class 1 obesity due to excess calories with serious comorbidity and body mass index (BMI) of 31.0 to 31.9 in adult -Low calorie diet, portion control and increase physical activity discussed with patient -Body mass index is 31.6 kg/m.  7-elevated D-dimer/right lower extremity superficial thrombophlebitis -No DVT and for that no anticoagulation required -VQ scan also negative for concerns of pulmonary embolism. -Continue supportive care. -will treat with aspirin at discharge.  DVT prophylaxis: Heparin Code Status: Full code Family Communication: No family at bedside. Disposition:   Status is: Inpatient  Dispo: The patient is from: Home              Anticipated d/c is to: Home              Anticipated d/c date is: 2-3 days              Patient currently no medically stable for discharge; right foot diabetic ulcer open and actively draining with positive blood cultures.  Continue current broad-spectrum antibiotics, supportive care and follow general surgery recommendations.  Some mild osteonecrosis appreciated but no frank osteomyelitis.  Early  abscess developing seen on MRI.    Consultants:   General surgery   Procedures:  See below for x-ray  report   Antimicrobials:  Daptomycin, ceftriaxone and Flagyl 01/12/20   Subjective: Afebrile, no chest pain, no nausea, no vomiting, no shortness of breath.  Active drainage out of open wound on his right foot appreciated.  Positive blood cultures aerobic and anaerobic for gram-positive cocci.  Objective: Vitals:   01/12/20 2246 01/13/20 0006 01/13/20 0352 01/13/20 1011  BP: 117/74 96/61 100/63 127/80  Pulse: 63 61 66   Resp: 19 19 19    Temp: 98.3 F (36.8 C) 98.9 F (37.2 C) 98.5 F (36.9 C)   TempSrc:  Oral Oral   SpO2: 99% 97% 98%   Weight:      Height:        Intake/Output Summary (Last 24 hours) at 01/13/2020 1503 Last data filed at 01/13/2020 0600 Gross per 24 hour  Intake 975.2 ml  Output --  Net 975.2 ml   Filed Weights   01/12/20 1253  Weight: 97.1 kg    Examination:  General exam: Appears calm and comfortable, denies chest pain, no nausea, no vomiting.  Currently afebrile.  Reports no pain in his foot secondary to neuropathy. Respiratory system: Clear to auscultation. Respiratory effort normal. Cardiovascular system: S1 & S2 heard, RRR. No JVD, murmurs, rubs, gallops or clicks. No pedal edema. Gastrointestinal system: Abdomen is nondistended, soft and nontender. No organomegaly or masses felt. Normal bowel sounds heard. Central nervous system: Alert and oriented. No focal neurological deficits. Extremities: No cyanosis or clubbing; right leg between ankle and knee with slight swelling reporting discomfort on palpation. Skin: Open wound plantar aspect right foot present on admission; no surrounding erythema, positive purulent drainage and mild foul-smelling.  See media section in epic for image. Psychiatry: Judgement and insight appear normal. Mood & affect appropriate.     Data Reviewed: I have personally reviewed following labs and imaging studies  CBC: Recent Labs  Lab 01/12/20 1328 01/13/20 0722  WBC 6.0 8.4  NEUTROABS 5.0  --   HGB 14.1 11.3*   HCT 45.2 37.0*  MCV 82.8 83.5  PLT 81* 66*    Basic Metabolic Panel: Recent Labs  Lab 01/12/20 1328 01/13/20 0722  NA 128* 136  K 4.7 3.8  CL 96* 109  CO2 21* 18*  GLUCOSE 609* 166*  BUN 55* 60*  CREATININE 2.10* 1.71*  CALCIUM 9.7 8.4*    GFR: Estimated Creatinine Clearance: 53.5 mL/min (A) (by C-G formula based on SCr of 1.71 mg/dL (H)).  Liver Function Tests: Recent Labs  Lab 01/13/20 0722  AST 15  ALT 17  ALKPHOS 97  BILITOT 0.8  PROT 6.1*  ALBUMIN 2.5*    CBG: Recent Labs  Lab 01/12/20 1301 01/12/20 2009 01/12/20 2231 01/13/20 0730 01/13/20 1125  GLUCAP >600* 342* 154* 135* 221*     Recent Results (from the past 240 hour(s))  SARS Coronavirus 2 by RT PCR (hospital order, performed in [facility] hospital lab) Nasopharyngeal Nasopharyngeal Swab     Status: None   Collection Time: 01/12/20  4:06 PM   Specimen: Nasopharyngeal Swab  Result Value Ref Range Status   SARS Coronavirus 2 NEGATIVE NEGATIVE Final    Comment: (NOTE) SARS-CoV-2 target nucleic acids are NOT DETECTED.  The SARS-CoV-2 RNA is generally detectable in upper and lower respiratory specimens during the acute phase of infection. The lowest concentration of SARS-CoV-2 viral copies this assay can detect is 250 copies /  mL. A negative result does not preclude SARS-CoV-2 infection and should not be used as the sole basis for treatment or other patient management decisions.  A negative result may occur with improper specimen collection / handling, submission of specimen other than nasopharyngeal swab, presence of viral mutation(s) within the areas targeted by this assay, and inadequate number of viral copies (<250 copies / mL). A negative result must be combined with clinical observations, patient history, and epidemiological information.  Fact Sheet for Patients:   StrictlyIdeas.no  Fact Sheet for Healthcare  Providers: BankingDealers.co.za  This test is not yet approved or  cleared by the [place] FDA and has been authorized for detection and/or diagnosis of SARS-CoV-2 by FDA under an Emergency Use Authorization (EUA).  This EUA will remain in effect (meaning this test can be used) for the duration of the COVID-19 declaration under Section 564(b)(1) of the Act, 21 U.S.C. section 360bbb-3(b)(1), unless the authorization is terminated or revoked sooner.  Performed at [facility], [address], [place], [place] [address]   Culture, blood (routine x 2)     Status: None (Preliminary result)   Collection Time: 01/12/20  6:16 PM   Specimen: BLOOD RIGHT HAND  Result Value Ref Range Status   Specimen Description BLOOD RIGHT HAND  Final   Special Requests   Final    BOTTLES DRAWN AEROBIC AND ANAEROBIC Blood Culture adequate volume   Culture  Setup Time   Final    ANAEROBIC and AEROBIC BOTTLE GRAM POSITIVE COCCI Gram Stain Report Called to,Read Back By and Verified With: DEAN,T@1103  BY MATTHEWS, B 8.20.2021 [facility] Performed at [facility], [address] [place], [place] [address]    Culture PENDING  Incomplete   Report Status PENDING  Incomplete  Culture, blood (routine x 2)     Status: None (Preliminary result)   Collection Time: 01/12/20  6:16 PM   Specimen: BLOOD RIGHT HAND  Result Value Ref Range Status   Specimen Description BLOOD RIGHT HAND  Final   Special Requests   Final    BOTTLES DRAWN AEROBIC AND ANAEROBIC Blood Culture adequate volume   Culture  Setup Time   Final    ANAEROBIC AND AEROBIC BOTTLES GRAM POSITIVE COCCI Gram Stain Report Called to,Read Back By and Verified With: DEAN,[email or web address]  BY MATTHEWS, B 8.20.21 [facility] Performed at [facility], [address] [place], [place] [address]    Culture PENDING  Incomplete   Report Status PENDING  Incomplete     Radiology Studies: NM Pulmonary Perfusion  Result Date:  01/13/2020 CLINICAL DATA:  Chest pain and shortness of breath with positive D-dimer EXAM: NUCLEAR MEDICINE PERFUSION LUNG SCAN TECHNIQUE: Perfusion images were obtained in multiple projections after intravenous injection of radiopharmaceutical. Views: Anterior, posterior, left lateral, right lateral, RPO, LPO, RAO, LAO RADIOPHARMACEUTICALS:  3.8 mCi Tc-92m MAA IV COMPARISON:  None. FINDINGS: Radiotracer uptake is homogeneous and symmetric bilaterally. No perfusion defects evident. IMPRESSION: No evident perfusion defects. No findings indicative of pulmonary embolus. Electronically Signed   By: Lowella Grip III M.D.   On: 01/13/2020 08:46   MR FOOT RIGHT WO CONTRAST  Result Date: 01/13/2020 CLINICAL DATA:  Diabetic foot ulcer EXAM: MRI OF THE RIGHT FOREFOOT WITHOUT CONTRAST TECHNIQUE: Multiplanar, multisequence MR imaging of the right foot was performed. No intravenous contrast was administered. COMPARISON:  X-ray 01/12/2020, 03/01/2018 FINDINGS: Bones/Joint/Cartilage Chronic changes to the first metatarsal head with subchondral collapse and subchondral cystic change. There is reactive sclerosis and subchondral cystic  change of the opposing great toe proximal phalanx base, more pronounced along the medial aspect. Small first MTP joint effusion. The medial hallux sesamoid also appears sclerotic with heterogeneous T2 hyperintense appearance which may reflect sequela of chronic sesamoiditis. Mild degenerative changes within the midfoot including at the second metatarsal base and within the lateral cuneiform. No acute fracture. No dislocation. No bony erosion or cortical destruction. Ligaments Intact Lisfranc ligament. Collateral ligaments of the forefoot appear intact. Muscles and Tendons Fatty atrophy of the intrinsic foot musculature suggesting chronic denervation changes. Intact flexor and extensor tendons without tenosynovitis. Soft tissues Plantar soft tissue ulceration underlies the medial forefoot at the  level of the first MTP joint. Ulcer base is contiguous with a fluid and air containing collection measuring approximately 2.1 x 0.8 x 1.8 cm (series 9, image 20; series 5, image 16) suggesting a developing abscess. IMPRESSION: 1. Plantar soft tissue ulceration underlies the medial forefoot at the level of the first MTP joint. Ulcer base is contiguous with a fluid and air containing collection measuring 2.1 x 0.8 x 1.8 cm suggesting a developing abscess. 2. Chronic bony changes to the first metatarsal head and great toe proximal phalanx with subchondral collapse and subchondral cystic change suggesting osteonecrosis. Findings do not appear appreciably changed compared to multiple prior radiographs. Although felt to be less likely, signal changes related to osteomyelitis would be difficult to entirely exclude. 3. Small first MTP joint effusion is likely reactive. Septic arthritis not excluded. 4. Additional findings suggestive of chronic sesamoiditis. Electronically Signed   By: Davina Poke D.O.   On: 01/13/2020 10:34   US Venous Img Lower Unilateral Right (DVT)  Result Date: 01/12/2020 CLINICAL DATA:  Calf and popliteal fossa pain x1 week, edema, redness EXAM: RIGHT LOWER EXTREMITY VENOUS DOPPLER ULTRASOUND TECHNIQUE: Gray-scale sonography with compression, as well as color and duplex ultrasound, were performed to evaluate the deep venous system(s) from the level of the common femoral vein through the popliteal and proximal calf veins. COMPARISON:  None. FINDINGS: VENOUS Normal compressibility of the common femoral, superficial femoral, and popliteal veins, as well as the visualized calf veins. Visualized portions of profunda femoral vein and great saphenous vein unremarkable. No filling defects to suggest DVT on grayscale or color Doppler imaging. Doppler waveforms show normal direction of venous flow, normal respiratory phasicity and response to augmentation. Limited views of the contralateral common  femoral vein are unremarkable. OTHER Hypoechoic thrombus in a noncompressible right short saphenous vein, and in the great saphenous vein below the knee. Hypoechoic right inguinal lymph node 1.1 cm short axis diameter. Limitations: none IMPRESSION: 1. Negative for DVT. 2. Superficial thrombophlebitis involving right short saphenous vein, and right GSV below the knee. Electronically Signed   By: Lucrezia Europe M.D.   On: 01/12/2020 16:11   US ARTERIAL ABI (SCREENING LOWER EXTREMITY)  Result Date: 01/13/2020 CLINICAL DATA:  59 year old male with a history of foot wound on right EXAM: NONINVASIVE PHYSIOLOGIC VASCULAR STUDY OF BILATERAL LOWER EXTREMITIES TECHNIQUE: Evaluation of both lower extremities was performed at rest, including calculation of ankle-brachial indices, multiple segmental pressure evaluation, segmental Doppler and segmental pulse volume recording. COMPARISON:  None. FINDINGS: Right ABI:  1.04 Left ABI:  1.09 Right Lower Extremity: Segmental Doppler at the right ankle demonstrates monophasic dorsalis pedis and posterior tibial artery Left Lower Extremity: Segmental Doppler on the left demonstrates triphasic posterior tibial artery and monophasic dorsalis pedis IMPRESSION: Right: Resting ABI within normal limits, though potentially falsely elevated. Segmental Doppler at the ankle demonstrates  monophasic waveforms, indicating more proximal occlusive disease. Left: Resting ABI within normal limits, though potentially falsely elevated. Segmental exam demonstrates waveform maintained in the posterior tibial artery and monophasic in the dorsalis pedis. Signed, Dulcy Fanny. Dellia Nims, RPVI [facility] Radiology Electronically Signed   By: Corrie Mckusick D.O.   On: 01/13/2020 13:49   DG Foot Complete Right  Result Date: 01/12/2020 CLINICAL DATA:  Diabetic foot infection. EXAM: RIGHT FOOT COMPLETE - 3+ VIEW COMPARISON:  April 05, 2019 FINDINGS: There is no  evidence of an acute fracture or dislocation. A chronic fracture of the proximal phalanx of the fifth right toe is noted. There is a large plantar calcaneal spur. Moderate severity degenerative changes are seen involving the metatarsophalangeal joint of the right great toe. Moderate to marked severity vascular calcification is seen. A mild amount of soft tissue air is seen along the plantar aspect of the distal right foot. Mild to moderate severity associated soft tissue swelling is seen. IMPRESSION: 1. Mild amount of soft tissue air along the plantar aspect of the distal right foot. 2. Chronic and degenerative changes, as described above. Electronically Signed   By: Virgina Norfolk M.D.   On: 01/12/2020 16:35    Scheduled Meds: . amLODipine  5 mg Oral Daily  . doxazosin  4 mg Oral Daily  . heparin  5,000 Units Subcutaneous Q8H  . hydrALAZINE  25 mg Oral BID  . insulin aspart  0-15 Units Subcutaneous TID WC  . insulin aspart  0-5 Units Subcutaneous QHS  . insulin aspart  4 Units Subcutaneous TID WC  . insulin glargine  30 Units Subcutaneous BID  . multivitamin  1 tablet Oral Daily  . tacrolimus  3 mg Oral BID  . Vitamin D (Ergocalciferol)  50,000 Units Oral Q30 days   Continuous Infusions: . sodium chloride 75 mL/hr at 01/12/20 2030  . sodium chloride 75 mL/hr at 01/12/20 1801  . cefTRIAXone (ROCEPHIN)  IV     And  . metronidazole 500 mg (01/13/20 1023)  . DAPTOmycin (CUBICIN)  IV 650 mg (01/12/20 2236)     LOS: 1 day    Time spent: 35 minutes    Barton Dubois, MD [facility]   To contact the attending provider between 7A-7P or the covering provider during after hours 7P-7A, please log into the web site [email or web address] and access using universal [place] password for that web site. If you do not have the password, please call the hospital operator.  01/13/2020, 3:03 PM

## 2020-01-14 ENCOUNTER — Inpatient Hospital Stay (HOSPITAL_COMMUNITY): Payer: BC Managed Care – PPO

## 2020-01-14 DIAGNOSIS — R7881 Bacteremia: Secondary | ICD-10-CM

## 2020-01-14 DIAGNOSIS — E11628 Type 2 diabetes mellitus with other skin complications: Secondary | ICD-10-CM

## 2020-01-14 LAB — ECHOCARDIOGRAM COMPLETE
Area-P 1/2: 3.54 cm2
Height: 69 in
S' Lateral: 2.08 cm
Weight: 3424 oz

## 2020-01-14 LAB — GLUCOSE, CAPILLARY: Glucose-Capillary: 197 mg/dL — ABNORMAL HIGH (ref 70–99)

## 2020-01-14 MED ORDER — POVIDONE-IODINE 10 % EX SOLN
CUTANEOUS | Status: AC
  Administered 2020-01-14: 1
  Filled 2020-01-14: qty 15

## 2020-01-14 MED ORDER — ASPIRIN EC 81 MG PO TBEC
162.0000 mg | DELAYED_RELEASE_TABLET | Freq: Every day | ORAL | Status: DC
  Administered 2020-01-14 – 2020-01-20 (×7): 162 mg via ORAL
  Filled 2020-01-14 (×7): qty 2

## 2020-01-14 MED ORDER — PANTOPRAZOLE SODIUM 40 MG PO TBEC
40.0000 mg | DELAYED_RELEASE_TABLET | Freq: Every day | ORAL | Status: DC
  Administered 2020-01-14 – 2020-01-20 (×7): 40 mg via ORAL
  Filled 2020-01-14 (×8): qty 1

## 2020-01-14 MED ORDER — MORPHINE SULFATE (PF) 4 MG/ML IV SOLN
4.0000 mg | Freq: Once | INTRAVENOUS | Status: AC
  Administered 2020-01-14: 4 mg via INTRAVENOUS
  Filled 2020-01-14: qty 1

## 2020-01-14 NOTE — Progress Notes (Signed)
PROGRESS NOTE    Raymond [name]  VZC:[id number] DOB: 1960/12/24 DOA: 01/12/2020 PCP: [name], FNP    Chief Complaint  Patient presents with  .  Right diabetic foot ulcer.    Brief Narrative:  As per H&P written by Dr. Waldron Labs on 01/12/2020  Raymond [name]  is a 59 y.o. 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 ED yesterday, and he left prior to getting his imaging study done, labs during that visit showing elevated D-dimers greater than 19, patient himself denies any chest pain, palpitation, dizziness, he does report mild dyspnea, report he has been compliant with his Lantus and glipizide. - in ED work-up significant for pseudohyponatremia sodium of 128, creatinine of 2.1 from baseline 1.3, glucose of 609, but normal anion gap, and bicarb of 21, had low platelet at 81, negative, he is unvaccinated, started on vancomycin, [facility] hospitalist consulted to admit.   Assessment & Plan: 1-right diabetic foot ulcer/MRSA bacteremia: Plantar aspect  -Open wound with purulent discharge -Present for the last 2 weeks and worsening -No tenderness secondary to neuropathy -High risk for becoming diffusely infected as patient has underlying history immunosuppressant due to renal transplant status.   -Continue current antibiotics, on Daptomycin. -ID has recommended to check TEE. -MRI suggesting developing abscess and osteonecrosis;  general surgery has been consulted -Will follow recommendations. -Continue wound care and soaking bath with Bethadine, no surgical intervention currently.   2-Acute kidney injury on chronic kidney disease; patient with prior history of renal transplant -has chronically Remained in a stage IIIa renal failure. -Continue minimizing nephrotoxic agents -Follow renal function trend -Gentle fluid resuscitation provided. -Continue Prograf. -Cr at baseline now, will monitor trend.  3-Immunosuppression ([place]) -Continue Prograf  -History of renal transplant.  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 -Follow CBGs -Modified carbohydrate diet has been encouraged. -Close follow-up after discharge with patient's endocrinologist has been recommended.  5-Essential hypertension, benign -Stable overall -Continue current antihypertensive regimen. -Follow vital signs.  6-Class 1 obesity due to excess calories with serious comorbidity and body mass index (BMI) of 31.0 to 31.9 in adult -Low calorie diet, portion control and increase physical activity discussed with patient -Body mass index is 31.6 kg/m.  7-elevated D-dimer/right lower extremity superficial thrombophlebitis -No DVT and SVT not close to femoral vein, for that no anticoagulation required -VQ scan also negative for concerns of pulmonary embolism. -Continue supportive care, TEd hoses usage and 162mg  of Aspirin.    DVT prophylaxis: Heparin Code Status: Full code Family Communication: No family at bedside. Disposition:   Status is: Inpatient  Dispo: The patient is from: Home              Anticipated d/c is to: Home              Anticipated d/c date is: 2-3 days  Patient currently no medically stable for discharge; right foot diabetic ulcer open and actively draining with positive MRSA blood cultures.   Continue current use of IV daptomycin and follow  TEE results. Some mild osteonecrosis appreciated but no frank osteomyelitis.  Appreciate assistance by general surgery and infectious disease services.  Continue wound care and IV antibiotics.    Consultants:   General surgery  Infectious disease (Dr. Tommy Medal) recommendations given to use daptomycin and to check TEE.  Cardiology service: Consulted for TEE on Monday   Procedures:  See below for x-ray report   Antimicrobials:  Daptomycin 01/12/20 ceftriaxone and Flagyl 01/12/20>>>01/14/20   Subjective: No fever, no chest pain, no nausea, no vomiting.  Continue to have active drainage of wound in his right plantar surface.  Blood cultures positive for MRSA.   Objective: Vitals:   01/13/20 1011 01/13/20 1600 01/13/20 1954 01/14/20 0539  BP: 127/80 (!) 100/53 (!) 108/59 131/86  Pulse:  (!) 58 63 76  Resp:  18 16 20   Temp:  98.3 F (36.8 C) 98.4 F (36.9 C) 99.2 F (37.3 C)  TempSrc:  Oral Oral Oral  SpO2:  99% 97% 97%  Weight:      Height:        Intake/Output Summary (Last 24 hours) at 01/14/2020 1128 Last data filed at 01/14/2020 0400 Gross per 24 hour  Intake 638.72 ml  Output --  Net 638.72 ml   Filed Weights   01/12/20 1253  Weight: 97.1 kg    Examination:  General exam: Alert, awake, oriented x 3, afebrile and denying any chest pain, shortness of breath, nausea or vomiting.  In no acute distress.  Hemodynamically stable. Respiratory system: Clear to auscultation. Respiratory effort normal. Cardiovascular system:RRR. No murmurs, rubs, gallops. Gastrointestinal system: Abdomen is nondistended, soft and nontender. No organomegaly or masses felt. Normal bowel sounds heard. Central nervous system: Alert and oriented. No focal neurological deficits. Extremities: No cyanosis or clubbing. Skin: No rashes, no petechiae; complete wound plantar aspect right foot present on admission; no surrounding erythema.  Continues to  have spontaneous drainage.  Right lower extremity below his knees with induration and tenderness to palpation secondary to SVT. Psychiatry: Judgement and insight appear normal. Mood & affect appropriate.    Data Reviewed: I have personally reviewed following labs and imaging studies  CBC: Recent Labs  Lab 01/12/20 1328 01/13/20 0722  WBC 6.0 8.4  NEUTROABS 5.0  --   HGB 14.1 11.3*  HCT 45.2 37.0*  MCV 82.8 83.5  PLT 81* 66*    Basic Metabolic Panel: Recent Labs  Lab 01/12/20 1328 01/13/20 0722  NA 128* 136  K 4.7 3.8  CL 96* 109  CO2 21* 18*  GLUCOSE 609* 166*  BUN 55* 60*  CREATININE 2.10* 1.71*  CALCIUM 9.7 8.4*    GFR: Estimated Creatinine Clearance: 53.5 mL/min (A) (by C-G formula based on SCr of 1.71 mg/dL (H)).  Liver Function Tests: Recent Labs  Lab 01/13/20 0722  AST 15  ALT 17  ALKPHOS 97  BILITOT 0.8  PROT 6.1*  ALBUMIN 2.5*    CBG: Recent Labs  Lab 01/12/20 2231 01/13/20 0730 01/13/20 1125 01/13/20 1658 01/13/20 2136  GLUCAP 154* 135* 221* 194* 143*     Recent Results (from the past 240 hour(s))  SARS Coronavirus 2 by RT PCR (hospital order, performed in [facility] hospital lab) Nasopharyngeal Nasopharyngeal Swab     Status: None   Collection Time: 01/12/20  4:06 PM   Specimen:  Nasopharyngeal Swab  Result Value Ref Range Status   SARS Coronavirus 2 NEGATIVE NEGATIVE Final    Comment: (NOTE) SARS-CoV-2 target nucleic acids are NOT DETECTED.  The SARS-CoV-2 RNA is generally detectable in upper and lower respiratory specimens during the acute phase of infection. The lowest concentration of SARS-CoV-2 viral copies this assay can detect is 250 copies / mL. A negative result does not preclude SARS-CoV-2 infection and should not be used as the sole basis for treatment or other patient management decisions.  A negative result may occur with improper specimen collection / handling, submission of specimen other than nasopharyngeal swab,  presence of viral mutation(s) within the areas targeted by this assay, and inadequate number of viral copies (<250 copies / mL). A negative result must be combined with clinical observations, patient history, and epidemiological information.  Fact Sheet for Patients:   StrictlyIdeas.no  Fact Sheet for Healthcare Providers: BankingDealers.co.za  This test is not yet approved or  cleared by the [place] FDA and has been authorized for detection and/or diagnosis of SARS-CoV-2 by FDA under an Emergency Use Authorization (EUA).  This EUA will remain in effect (meaning this test can be used) for the duration of the COVID-19 declaration under Section 564(b)(1) of the Act, 21 U.S.C. section 360bbb-3(b)(1), unless the authorization is terminated or revoked sooner.  Performed at [facility], [address] [place], [place] [address]   Culture, blood (routine x 2)     Status: None (Preliminary result)   Collection Time: 01/12/20  6:16 PM   Specimen: BLOOD RIGHT HAND  Result Value Ref Range Status   Specimen Description   Final    BLOOD RIGHT HAND Performed at [facility], [address] [place], [place] [address]    Special Requests   Final    BOTTLES DRAWN AEROBIC AND ANAEROBIC Blood Culture adequate volume Performed at [facility], [address], [place], [place] [address]    Culture  Setup Time   Final    ANAEROBIC and AEROBIC BOTTLE GRAM POSITIVE COCCI Gram Stain Report Called to,Read Back By and Verified With: DEAN,T@1103  BY MATTHEWS, B 8.20.2021 [facility] Organism ID to follow CRITICAL RESULT CALLED TO, READ BACK BY AND VERIFIED WITH: A,MUHAMMAD RN @1947  01/13/20 EB Performed at [facility] [address] [place], [place] [address]    Culture GRAM POSITIVE COCCI  Final   Report Status PENDING  Incomplete  Culture, blood (routine x 2)     Status: None (Preliminary result)   Collection Time: 01/12/20  6:16 PM     Specimen: BLOOD RIGHT HAND  Result Value Ref Range Status   Specimen Description   Final    BLOOD RIGHT HAND Performed at [facility], [address] [place], [place] [address]    Special Requests   Final    BOTTLES DRAWN AEROBIC AND ANAEROBIC Blood Culture adequate volume Performed at [facility], [address], [place], [place] [address]    Culture  Setup Time   Final    ANAEROBIC AND AEROBIC BOTTLES GRAM POSITIVE COCCI Gram Stain Report Called to,Read Back By and Verified With: DEAN,T@1103  BY MATTHEWS, B 8.20.21 [facility] CRITICAL VALUE NOTED.  VALUE IS CONSISTENT WITH PREVIOUSLY REPORTED AND CALLED VALUE. Performed at [facility], [place] [address] [place], [place] [address]    Culture [facility] POSITIVE COCCI  Final   Report Status PENDING  Incomplete  Blood Culture ID Panel (Reflexed)     Status: Abnormal  Collection Time: 01/12/20  6:16 PM  Result Value Ref Range Status   Enterococcus faecalis NOT DETECTED NOT DETECTED Final   Enterococcus Faecium NOT DETECTED NOT DETECTED Final   Listeria monocytogenes NOT DETECTED NOT DETECTED Final   Staphylococcus species DETECTED (A) NOT DETECTED Final    Comment: RESULT CALLED TO, READ BACK BY AND VERIFIED WITH: A,MUHAMMAD RN @1947  01/13/20 EB    Staphylococcus aureus (BCID) DETECTED (A) NOT DETECTED Final    Comment: Methicillin (oxacillin)-resistant Staphylococcus aureus (MRSA). MRSA is predictably resistant to beta-lactam antibiotics (except ceftaroline). Preferred therapy is vancomycin unless clinically contraindicated. Patient requires contact precautions if  hospitalized. RESULT CALLED TO, READ BACK BY AND VERIFIED WITH: A,MUHAMMAD RN @1947  01/13/20 EB    Staphylococcus epidermidis NOT DETECTED NOT DETECTED Final   Staphylococcus lugdunensis NOT DETECTED NOT DETECTED Final   Streptococcus species NOT DETECTED NOT DETECTED Final   Streptococcus agalactiae NOT DETECTED NOT DETECTED Final   Streptococcus  pneumoniae NOT DETECTED NOT DETECTED Final   Streptococcus pyogenes NOT DETECTED NOT DETECTED Final   A.calcoaceticus-baumannii NOT DETECTED NOT DETECTED Final   Bacteroides fragilis NOT DETECTED NOT DETECTED Final   Enterobacterales NOT DETECTED NOT DETECTED Final   Enterobacter cloacae complex NOT DETECTED NOT DETECTED Final   Escherichia coli NOT DETECTED NOT DETECTED Final   Klebsiella aerogenes NOT DETECTED NOT DETECTED Final   Klebsiella oxytoca NOT DETECTED NOT DETECTED Final   Klebsiella pneumoniae NOT DETECTED NOT DETECTED Final   Proteus species NOT DETECTED NOT DETECTED Final   Salmonella species NOT DETECTED NOT DETECTED Final   Serratia marcescens NOT DETECTED NOT DETECTED Final   Haemophilus influenzae NOT DETECTED NOT DETECTED Final   Neisseria meningitidis NOT DETECTED NOT DETECTED Final   Pseudomonas aeruginosa NOT DETECTED NOT DETECTED Final   Stenotrophomonas maltophilia NOT DETECTED NOT DETECTED Final   Candida albicans NOT DETECTED NOT DETECTED Final   Candida auris NOT DETECTED NOT DETECTED Final   Candida glabrata NOT DETECTED NOT DETECTED Final   Candida krusei NOT DETECTED NOT DETECTED Final   Candida parapsilosis NOT DETECTED NOT DETECTED Final   Candida tropicalis NOT DETECTED NOT DETECTED Final   Cryptococcus neoformans/gattii NOT DETECTED NOT DETECTED Final   Meth resistant mecA/C and MREJ DETECTED (A) NOT DETECTED Final    Comment: RESULT CALLED TO, READ BACK BY AND VERIFIED WITH: A,MUHAMMAD RN @1947  01/13/20 EB Performed at [facility], [address] [place], [place] [address]   Culture, blood (Routine X 2) w Reflex to ID Panel     Status: None (Preliminary result)   Collection Time: 01/13/20  9:04 PM   Specimen: BLOOD RIGHT HAND  Result Value Ref Range Status   Specimen Description BLOOD RIGHT HAND  Final   Special Requests   Final    BOTTLES DRAWN AEROBIC AND ANAEROBIC Blood Culture adequate volume   Culture   Final    NO GROWTH < 12  HOURS Performed at [facility], [address], [place], [place] [address]    Report Status PENDING  Incomplete  Culture, blood (Routine X 2) w Reflex to ID Panel     Status: None (Preliminary result)   Collection Time: 01/13/20  9:04 PM   Specimen: BLOOD RIGHT HAND  Result Value Ref Range Status   Specimen Description BLOOD RIGHT HAND  Final   Special Requests   Final    BOTTLES DRAWN AEROBIC AND ANAEROBIC Blood Culture adequate volume   Culture   Final    NO GROWTH <  12 HOURS Performed at [facility], [address] [place], [place] [address]    Report Status PENDING  Incomplete     Radiology Studies: NM Pulmonary Perfusion  Result Date: 01/13/2020 CLINICAL DATA:  Chest pain and shortness of breath with positive D-dimer EXAM: NUCLEAR MEDICINE PERFUSION LUNG SCAN TECHNIQUE: Perfusion images were obtained in multiple projections after intravenous injection of radiopharmaceutical. Views: Anterior, posterior, left lateral, right lateral, RPO, LPO, RAO, LAO RADIOPHARMACEUTICALS:  3.8 mCi Tc-50m MAA IV COMPARISON:  None. FINDINGS: Radiotracer uptake is homogeneous and symmetric bilaterally. No perfusion defects evident. IMPRESSION: No evident perfusion defects. No findings indicative of pulmonary embolus. Electronically Signed   By: Lowella Grip III M.D.   On: 01/13/2020 08:46   MR FOOT RIGHT WO CONTRAST  Result Date: 01/13/2020 CLINICAL DATA:  Diabetic foot ulcer EXAM: MRI OF THE RIGHT FOREFOOT WITHOUT CONTRAST TECHNIQUE: Multiplanar, multisequence MR imaging of the right foot was performed. No intravenous contrast was administered. COMPARISON:  X-ray 01/12/2020, 03/01/2018 FINDINGS: Bones/Joint/Cartilage Chronic changes to the first metatarsal head with subchondral collapse and subchondral cystic change. There is reactive sclerosis and subchondral cystic change of the opposing great toe proximal phalanx base, more pronounced along the medial aspect. Small first MTP joint effusion. The  medial hallux sesamoid also appears sclerotic with heterogeneous T2 hyperintense appearance which may reflect sequela of chronic sesamoiditis. Mild degenerative changes within the midfoot including at the second metatarsal base and within the lateral cuneiform. No acute fracture. No dislocation. No bony erosion or cortical destruction. Ligaments Intact Lisfranc ligament. Collateral ligaments of the forefoot appear intact. Muscles and Tendons Fatty atrophy of the intrinsic foot musculature suggesting chronic denervation changes. Intact flexor and extensor tendons without tenosynovitis. Soft tissues Plantar soft tissue ulceration underlies the medial forefoot at the level of the first MTP joint. Ulcer base is contiguous with a fluid and air containing collection measuring approximately 2.1 x 0.8 x 1.8 cm (series 9, image 20; series 5, image 16) suggesting a developing abscess. IMPRESSION: 1. Plantar soft tissue ulceration underlies the medial forefoot at the level of the first MTP joint. Ulcer base is contiguous with a fluid and air containing collection measuring 2.1 x 0.8 x 1.8 cm suggesting a developing abscess. 2. Chronic bony changes to the first metatarsal head and great toe proximal phalanx with subchondral collapse and subchondral cystic change suggesting osteonecrosis. Findings do not appear appreciably changed compared to multiple prior radiographs. Although felt to be less likely, signal changes related to osteomyelitis would be difficult to entirely exclude. 3. Small first MTP joint effusion is likely reactive. Septic arthritis not excluded. 4. Additional findings suggestive of chronic sesamoiditis. Electronically Signed   By: Davina Poke D.O.   On: 01/13/2020 10:34   US Venous Img Lower Unilateral Right (DVT)  Result Date: 01/12/2020 CLINICAL DATA:  Calf and popliteal fossa pain x1 week, edema, redness EXAM: RIGHT LOWER EXTREMITY VENOUS DOPPLER ULTRASOUND TECHNIQUE: Gray-scale sonography with  compression, as well as color and duplex ultrasound, were performed to evaluate the deep venous system(s) from the level of the common femoral vein through the popliteal and proximal calf veins. COMPARISON:  None. FINDINGS: VENOUS Normal compressibility of the common femoral, superficial femoral, and popliteal veins, as well as the visualized calf veins. Visualized portions of profunda femoral vein and great saphenous vein unremarkable. No filling defects to suggest DVT on grayscale or color Doppler imaging. Doppler waveforms show normal direction of venous flow, normal respiratory phasicity and response to augmentation. Limited views of the  contralateral common femoral vein are unremarkable. OTHER Hypoechoic thrombus in a noncompressible right short saphenous vein, and in the great saphenous vein below the knee. Hypoechoic right inguinal lymph node 1.1 cm short axis diameter. Limitations: none IMPRESSION: 1. Negative for DVT. 2. Superficial thrombophlebitis involving right short saphenous vein, and right GSV below the knee. Electronically Signed   By: Lucrezia Europe M.D.   On: 01/12/2020 16:11   US ARTERIAL ABI (SCREENING LOWER EXTREMITY)  Result Date: 01/13/2020 CLINICAL DATA:  59 year old male with a history of foot wound on right EXAM: NONINVASIVE PHYSIOLOGIC VASCULAR STUDY OF BILATERAL LOWER EXTREMITIES TECHNIQUE: Evaluation of both lower extremities was performed at rest, including calculation of ankle-brachial indices, multiple segmental pressure evaluation, segmental Doppler and segmental pulse volume recording. COMPARISON:  None. FINDINGS: Right ABI:  1.04 Left ABI:  1.09 Right Lower Extremity: Segmental Doppler at the right ankle demonstrates monophasic dorsalis pedis and posterior tibial artery Left Lower Extremity: Segmental Doppler on the left demonstrates triphasic posterior tibial artery and monophasic dorsalis pedis IMPRESSION: Right: Resting ABI within normal limits, though potentially falsely  elevated. Segmental Doppler at the ankle demonstrates monophasic waveforms, indicating more proximal occlusive disease. Left: Resting ABI within normal limits, though potentially falsely elevated. Segmental exam demonstrates waveform maintained in the posterior tibial artery and monophasic in the dorsalis pedis. Signed, Dulcy Fanny. Dellia Nims, RPVI [facility] Electronically Signed   By: Corrie Mckusick D.O.   On: 01/13/2020 13:49   DG Foot Complete Right  Result Date: 01/12/2020 CLINICAL DATA:  Diabetic foot infection. EXAM: RIGHT FOOT COMPLETE - 3+ VIEW COMPARISON:  April 05, 2019 FINDINGS: There is no evidence of an acute fracture or dislocation. A chronic fracture of the proximal phalanx of the fifth right toe is noted. There is a large plantar calcaneal spur. Moderate severity degenerative changes are seen involving the metatarsophalangeal joint of the right great toe. Moderate to marked severity vascular calcification is seen. A mild amount of soft tissue air is seen along the plantar aspect of the distal right foot. Mild to moderate severity associated soft tissue swelling is seen. IMPRESSION: 1. Mild amount of soft tissue air along the plantar aspect of the distal right foot. 2. Chronic and degenerative changes, as described above. Electronically Signed   By: Virgina Norfolk M.D.   On: 01/12/2020 16:35    Scheduled Meds: . amLODipine  5 mg Oral Daily  . doxazosin  4 mg Oral Daily  . heparin  5,000 Units Subcutaneous Q8H  . hydrALAZINE  25 mg Oral BID  . insulin aspart  0-15 Units Subcutaneous TID WC  . insulin aspart  0-5 Units Subcutaneous QHS  . insulin aspart  4 Units Subcutaneous TID WC  . insulin glargine  30 Units Subcutaneous BID  . multivitamin  1 tablet Oral Daily  . tacrolimus  3 mg Oral BID  . Vitamin D (Ergocalciferol)  50,000 Units Oral Q30 days   Continuous Infusions: . sodium chloride 75 mL/hr at 01/12/20 2030  .  sodium chloride 75 mL/hr at 01/12/20 1801  . DAPTOmycin (CUBICIN)  IV 650 mg (01/13/20 2319)     LOS: 2 days    Time spent: 35 minutes    Barton Dubois, MD [facility]   To contact the attending provider between 7A-7P or the covering provider during after hours 7P-7A, please log into the web site [email or web address] and access using universal [place] password for that web site. If you do not have the  password, please call the hospital operator.  01/14/2020, 11:28 AM

## 2020-01-14 NOTE — Consult Note (Signed)
Reason for Consult: Diabetic right foot cellulitis/abscess Referring Physician: Dr. Luevenia Maxin Raymond [name] is an 59 y.o. male.  HPI: Patient is a 59 year old black male with a history of end-stage renal disease, status post renal transplantation in 2017, diabetes mellitus, history of right foot osteomyelitis who had been intermittently followed by Dr. Jacqualyn Posey of podiatry in [place] who was sent to the emergency room for admission due to worsening swelling of the right foot and right lower extremity.  He was found on MRI of the right foot to have cellulitis with developing abscess around the first metatarsal head of the right foot.  Venous ultrasound was negative for deep vein thrombosis, though he had superficial saphenous vein thrombosis below the knee.  Segmental arterial studies were not too remarkable.  He was admitted to the hospital for IV antibiotics.  His blood culture was positive for MRSA.  Ongoing drainage has been noted from the plantar surface of the right foot over the first metatarsal head.  Past Medical History:  Diagnosis Date  . Chronic kidney disease    Awaiting transplant  . Diabetes ([place])   . Diabetes mellitus without complication ([place])   . Hypertension   . Renal disorder   . Renal insufficiency     Past Surgical History:  Procedure Laterality Date  . COLONOSCOPY N/A 12/05/2014   WJX:[id number] external and internal hemorrhoid/mild diverticulosis/11 polyps removed  . ESOPHAGOGASTRODUODENOSCOPY N/A 12/05/2014   AOZ:HYQM duodenitis  . IR FLUORO GUIDE CV LINE RIGHT  03/01/2019  . IR REMOVAL TUN CV CATH W/O FL  05/10/2019  . IR US GUIDE VASC ACCESS RIGHT  03/01/2019  . KIDNEY TRANSPLANT  03/2015    Family History  Problem Relation Age of Onset  . Diabetes Mother   . Heart disease Father   . Colon cancer Neg Hx     Social History:  reports that he has never smoked. He has never used smokeless tobacco. He reports that he does not drink alcohol and does not use  drugs.  Allergies: No Known Allergies  Medications:  Scheduled: . amLODipine  5 mg Oral Daily  . doxazosin  4 mg Oral Daily  . heparin  5,000 Units Subcutaneous Q8H  . hydrALAZINE  25 mg Oral BID  . insulin aspart  0-15 Units Subcutaneous TID WC  . insulin aspart  0-5 Units Subcutaneous QHS  . insulin aspart  4 Units Subcutaneous TID WC  . insulin glargine  30 Units Subcutaneous BID  . multivitamin  1 tablet Oral Daily  . tacrolimus  3 mg Oral BID  . Vitamin D (Ergocalciferol)  50,000 Units Oral Q30 days    Results for orders placed or performed during the hospital encounter of 01/12/20 (from the past 48 hour(s))  CBG monitoring, ED     Status: Abnormal   Collection Time: 01/12/20 12:59 PM  Result Value Ref Range   Glucose-Capillary >600 (HH) 70 - 99 mg/dL    Comment: Glucose reference range applies only to samples taken after fasting for at least 8 hours.  Glucose, capillary     Status: Abnormal   Collection Time: 01/12/20 12:59 PM  Result Value Ref Range   Glucose-Capillary >600 (HH) 70 - 99 mg/dL    Comment: Glucose reference range applies only to samples taken after fasting for at least 8 hours. REPEATED TO VERIFY, CHARGE CREDITED Performed at [facility], [address], [place], [place] [address]   CBG monitoring, ED     Status: Abnormal   Collection Time:  01/12/20  1:01 PM  Result Value Ref Range   Glucose-Capillary >600 (HH) 70 - 99 mg/dL    Comment: Glucose reference range applies only to samples taken after fasting for at least 8 hours.  Basic metabolic panel     Status: Abnormal   Collection Time: 01/12/20  1:28 PM  Result Value Ref Range   Sodium 128 (L) 135 - 145 mmol/L   Potassium 4.7 3.5 - 5.1 mmol/L   Chloride 96 (L) 98 - 111 mmol/L   CO2 21 (L) 22 - 32 mmol/L   Glucose, Bld 609 (HH) 70 - 99 mg/dL    Comment: Glucose reference range applies only to samples taken after fasting for at least 8 hours. CRITICAL RESULT CALLED TO, READ BACK BY AND VERIFIED  WITH: OAKLEY,BRANDI @1347  01/12/20 BY JONES,T    BUN 55 (H) 6 - 20 mg/dL   Creatinine, Ser 2.10 (H) 0.61 - 1.24 mg/dL   Calcium 9.7 8.9 - 10.3 mg/dL   GFR calc non Af Amer 33 (L) >60 mL/min   GFR calc Af Amer 39 (L) >60 mL/min   Anion gap 11 5 - 15    Comment: Performed at [facility], [address] [place], [place] [address]  CBC with Differential     Status: Abnormal   Collection Time: 01/12/20  1:28 PM  Result Value Ref Range   WBC 6.0 4.0 - 10.5 K/uL   RBC 5.46 4.22 - 5.81 MIL/uL   Hemoglobin 14.1 13.0 - 17.0 g/dL   HCT 45.2 39 - 52 %   MCV 82.8 80.0 - 100.0 fL   MCH 25.8 (L) 26.0 - 34.0 pg   MCHC 31.2 30.0 - 36.0 g/dL   RDW 14.1 11.5 - 15.5 %   Platelets 81 (L) 150 - 400 K/uL    Comment: PLATELET COUNT CONFIRMED BY SMEAR SPECIMEN CHECKED FOR CLOTS Immature Platelet Fraction may be clinically indicated, consider ordering this additional test [id number]    nRBC 0.0 0.0 - 0.2 %   Neutrophils Relative % 55 %   Neutro Abs 5.0 1.7 - 7.7 K/uL   Band Neutrophils 28 %   Lymphocytes Relative 2 %   Lymphs Abs 0.1 (L) 0.7 - 4.0 K/uL   Monocytes Relative 13 %   Monocytes Absolute 0.8 0 - 1 K/uL   Eosinophils Relative 0 %   Eosinophils Absolute 0.0 0 - 0 K/uL   Basophils Relative 0 %   Basophils Absolute 0.0 0 - 0 K/uL   WBC Morphology INCREASED BANDS (>20% BANDS)     Comment: VACUOLATED NEUTROPHILS   Myelocytes 1 %   Promyelocytes Relative 1 %    Comment: Performed at [facility], [address], [place], [place] [address]  Hemoglobin A1c     Status: Abnormal   Collection Time: 01/12/20  1:28 PM  Result Value Ref Range   Hgb A1c MFr Bld 13.7 (H) 4.8 - 5.6 %    Comment: (NOTE) Pre diabetes:          5.7%-6.4%  Diabetes:              >6.4%  Glycemic control for   <7.0% adults with diabetes    Mean Plasma Glucose 346.49 mg/dL    Comment: Performed at [facility], [place] [address], [place], [place] [address]  SARS Coronavirus 2 by RT PCR (hospital order, performed  in [facility] hospital lab) Nasopharyngeal Nasopharyngeal Swab     Status: None   Collection Time: 01/12/20  4:06 PM   Specimen: Nasopharyngeal Swab  Result Value Ref Range   SARS Coronavirus 2 NEGATIVE NEGATIVE    Comment: (NOTE) SARS-CoV-2 target nucleic acids are NOT DETECTED.  The SARS-CoV-2 RNA is generally detectable in upper and lower respiratory specimens during the acute phase of infection. The lowest concentration of SARS-CoV-2 viral copies this assay can detect is 250 copies / mL. A negative result does not preclude SARS-CoV-2 infection and should not be used as the sole basis for treatment or other patient management decisions.  A negative result may occur with improper specimen collection / handling, submission of specimen other than nasopharyngeal swab, presence of viral mutation(s) within the areas targeted by this assay, and inadequate number of viral copies (<250 copies / mL). A negative result must be combined with clinical observations, patient history, and epidemiological information.  Fact Sheet for Patients:   StrictlyIdeas.no  Fact Sheet for Healthcare Providers: BankingDealers.co.za  This test is not yet approved or  cleared by the [place] FDA and has been authorized for detection and/or diagnosis of SARS-CoV-2 by FDA under an Emergency Use Authorization (EUA).  This EUA will remain in effect (meaning this test can be used) for the duration of the COVID-19 declaration under Section 564(b)(1) of the Act, 21 U.S.C. section 360bbb-3(b)(1), unless the authorization is terminated or revoked sooner.  Performed at [facility], [address], [place], [place] [address]   Culture, blood (routine x 2)     Status: None (Preliminary result)   Collection Time: 01/12/20  6:16 PM   Specimen: BLOOD RIGHT HAND  Result Value Ref Range   Specimen Description      BLOOD RIGHT HAND Performed at [facility], [address] [place], [place] [address]    Special Requests      BOTTLES DRAWN AEROBIC AND ANAEROBIC Blood Culture adequate volume Performed at [facility], [address] [place], [place] [address]    Culture  Setup Time      ANAEROBIC and AEROBIC BOTTLE GRAM POSITIVE COCCI Gram Stain Report Called to,Read Back By and Verified With: DEAN,T@1103  BY MATTHEWS, B 8.20.2021 [facility] Organism ID to follow CRITICAL RESULT CALLED TO, READ BACK BY AND VERIFIED WITH: A,MUHAMMAD RN @1947  01/13/20 EB Performed at [facility], [address], [place], [place] [address]    Culture GRAM POSITIVE COCCI    Report Status PENDING   Culture, blood (routine x 2)     Status: None (Preliminary result)   Collection Time: 01/12/20  6:16 PM   Specimen: BLOOD RIGHT HAND  Result Value Ref Range   Specimen Description      BLOOD RIGHT HAND Performed at [facility], [address], [place], [place] [address]    Special Requests      BOTTLES DRAWN AEROBIC AND ANAEROBIC Blood Culture adequate volume Performed at [facility], [address], [place], [place] [address]    Culture  Setup Time      ANAEROBIC AND AEROBIC BOTTLES GRAM POSITIVE COCCI Gram Stain Report Called to,Read Back By and Verified With: DEAN,T@1103  BY MATTHEWS, B 8.20.21 [facility] CRITICAL VALUE NOTED.  VALUE IS CONSISTENT WITH PREVIOUSLY REPORTED AND CALLED VALUE. Performed at [facility], [place] [address] [place], [place] [address]    Culture      NO GROWTH 2 DAYS Performed at [facility], [address] [place],  [address]    Report Status PENDING   Blood Culture ID Panel (Reflexed)  Status: Abnormal   Collection Time: 01/12/20  6:16 PM  Result Value Ref Range   Enterococcus faecalis NOT DETECTED NOT DETECTED   Enterococcus Faecium NOT DETECTED NOT DETECTED   Listeria monocytogenes NOT DETECTED NOT DETECTED   Staphylococcus species DETECTED (A) NOT DETECTED    Comment: RESULT CALLED TO, READ BACK  BY AND VERIFIED WITH: A,MUHAMMAD RN @1947  01/13/20 EB    Staphylococcus aureus (BCID) DETECTED (A) NOT DETECTED    Comment: Methicillin (oxacillin)-resistant Staphylococcus aureus (MRSA). MRSA is predictably resistant to beta-lactam antibiotics (except ceftaroline). Preferred therapy is vancomycin unless clinically contraindicated. Patient requires contact precautions if  hospitalized. RESULT CALLED TO, READ BACK BY AND VERIFIED WITH: A,MUHAMMAD RN @1947  01/13/20 EB    Staphylococcus epidermidis NOT DETECTED NOT DETECTED   Staphylococcus lugdunensis NOT DETECTED NOT DETECTED   Streptococcus species NOT DETECTED NOT DETECTED   Streptococcus agalactiae NOT DETECTED NOT DETECTED   Streptococcus pneumoniae NOT DETECTED NOT DETECTED   Streptococcus pyogenes NOT DETECTED NOT DETECTED   A.calcoaceticus-baumannii NOT DETECTED NOT DETECTED   Bacteroides fragilis NOT DETECTED NOT DETECTED   Enterobacterales NOT DETECTED NOT DETECTED   Enterobacter cloacae complex NOT DETECTED NOT DETECTED   Escherichia coli NOT DETECTED NOT DETECTED   Klebsiella aerogenes NOT DETECTED NOT DETECTED   Klebsiella oxytoca NOT DETECTED NOT DETECTED   Klebsiella pneumoniae NOT DETECTED NOT DETECTED   Proteus species NOT DETECTED NOT DETECTED   Salmonella species NOT DETECTED NOT DETECTED   Serratia marcescens NOT DETECTED NOT DETECTED   Haemophilus influenzae NOT DETECTED NOT DETECTED   Neisseria meningitidis NOT DETECTED NOT DETECTED   Pseudomonas aeruginosa NOT DETECTED NOT DETECTED   Stenotrophomonas maltophilia NOT DETECTED NOT DETECTED   Candida albicans NOT DETECTED NOT DETECTED   Candida auris NOT DETECTED NOT DETECTED   Candida glabrata NOT DETECTED NOT DETECTED   Candida krusei NOT DETECTED NOT DETECTED   Candida parapsilosis NOT DETECTED NOT DETECTED   Candida tropicalis NOT DETECTED NOT DETECTED   Cryptococcus neoformans/gattii NOT DETECTED NOT DETECTED   Meth resistant mecA/C and MREJ DETECTED (A) NOT  DETECTED    Comment: RESULT CALLED TO, READ BACK BY AND VERIFIED WITH: A,MUHAMMAD RN @1947  01/13/20 EB Performed at [facility], [address] [place], [place] [address]   Glucose, capillary     Status: Abnormal   Collection Time: 01/12/20  8:09 PM  Result Value Ref Range   Glucose-Capillary 342 (H) 70 - 99 mg/dL    Comment: Glucose reference range applies only to samples taken after fasting for at least 8 hours.  Glucose, capillary     Status: Abnormal   Collection Time: 01/12/20 10:31 PM  Result Value Ref Range   Glucose-Capillary 154 (H) 70 - 99 mg/dL    Comment: Glucose reference range applies only to samples taken after fasting for at least 8 hours.  CK     Status: Abnormal   Collection Time: 01/13/20  7:22 AM  Result Value Ref Range   Total CK 30 (L) 49.0 - 397.0 U/L    Comment: Performed at [facility], [address] [place], [place] [address]  Comprehensive metabolic panel     Status: Abnormal   Collection Time: 01/13/20  7:22 AM  Result Value Ref Range   Sodium 136 135 - 145 mmol/L   Potassium 3.8 3.5 - 5.1 mmol/L    Comment: DELTA CHECK NOTED   Chloride 109 98 - 111 mmol/L   CO2 18 (L) 22 - 32 mmol/L  Glucose, Bld 166 (H) 70 - 99 mg/dL    Comment: Glucose reference range applies only to samples taken after fasting for at least 8 hours.   BUN 60 (H) 6 - 20 mg/dL   Creatinine, Ser 1.71 (H) 0.61 - 1.24 mg/dL   Calcium 8.4 (L) 8.9 - 10.3 mg/dL   Total Protein 6.1 (L) 6.5 - 8.1 g/dL   Albumin 2.5 (L) 3.5 - 5.0 g/dL   AST 15 15 - 41 U/L   ALT 17 0 - 44 U/L   Alkaline Phosphatase 97 38 - 126 U/L   Total Bilirubin 0.8 0.3 - 1.2 mg/dL   GFR calc non Af Amer 43 (L) >60 mL/min   GFR calc Af Amer 50 (L) >60 mL/min   Anion gap 9 5 - 15    Comment: Performed at [facility], [address], [place], [place] [address]  CBC     Status: Abnormal   Collection Time: 01/13/20  7:22 AM  Result Value Ref Range   WBC 8.4 4.0 - 10.5 K/uL   RBC 4.43 4.22 - 5.81 MIL/uL    Hemoglobin 11.3 (L) 13.0 - 17.0 g/dL   HCT 37.0 (L) 39 - 52 %   MCV 83.5 80.0 - 100.0 fL   MCH 25.5 (L) 26.0 - 34.0 pg   MCHC 30.5 30.0 - 36.0 g/dL   RDW 14.0 11.5 - 15.5 %   Platelets 66 (L) 150 - 400 K/uL    Comment: REPEATED TO VERIFY PLATELET COUNT CONFIRMED BY SMEAR SPECIMEN CHECKED FOR CLOTS    nRBC 0.0 0.0 - 0.2 %    Comment: Performed at [facility], [address], [place], [place] [address]  Glucose, capillary     Status: Abnormal   Collection Time: 01/13/20  7:30 AM  Result Value Ref Range   Glucose-Capillary 135 (H) 70 - 99 mg/dL    Comment: Glucose reference range applies only to samples taken after fasting for at least 8 hours.  Glucose, capillary     Status: Abnormal   Collection Time: 01/13/20 11:25 AM  Result Value Ref Range   Glucose-Capillary 221 (H) 70 - 99 mg/dL    Comment: Glucose reference range applies only to samples taken after fasting for at least 8 hours.  Glucose, capillary     Status: Abnormal   Collection Time: 01/13/20  4:58 PM  Result Value Ref Range   Glucose-Capillary 194 (H) 70 - 99 mg/dL    Comment: Glucose reference range applies only to samples taken after fasting for at least 8 hours.  Culture, blood (Routine X 2) w Reflex to ID Panel     Status: None (Preliminary result)   Collection Time: 01/13/20  9:04 PM   Specimen: BLOOD RIGHT HAND  Result Value Ref Range   Specimen Description BLOOD RIGHT HAND    Special Requests      BOTTLES DRAWN AEROBIC AND ANAEROBIC Blood Culture adequate volume   Culture      NO GROWTH < 12 HOURS Performed at [facility], [address] [place], [place] [address]    Report Status PENDING   Culture, blood (Routine X 2) w Reflex to ID Panel     Status: None (Preliminary result)   Collection Time: 01/13/20  9:04 PM   Specimen: BLOOD RIGHT HAND  Result Value Ref Range   Specimen Description BLOOD RIGHT HAND    Special Requests      BOTTLES DRAWN AEROBIC AND ANAEROBIC Blood Culture adequate volume  Culture       NO GROWTH < 12 HOURS Performed at [facility], [address] [place], [place] [address]    Report Status PENDING   Glucose, capillary     Status: Abnormal   Collection Time: 01/13/20  9:36 PM  Result Value Ref Range   Glucose-Capillary 143 (H) 70 - 99 mg/dL    Comment: Glucose reference range applies only to samples taken after fasting for at least 8 hours.    NM Pulmonary Perfusion  Result Date: 01/13/2020 CLINICAL DATA:  Chest pain and shortness of breath with positive D-dimer EXAM: NUCLEAR MEDICINE PERFUSION LUNG SCAN TECHNIQUE: Perfusion images were obtained in multiple projections after intravenous injection of radiopharmaceutical. Views: Anterior, posterior, left lateral, right lateral, RPO, LPO, RAO, LAO RADIOPHARMACEUTICALS:  3.8 mCi Tc-38m MAA IV COMPARISON:  None. FINDINGS: Radiotracer uptake is homogeneous and symmetric bilaterally. No perfusion defects evident. IMPRESSION: No evident perfusion defects. No findings indicative of pulmonary embolus. Electronically Signed   By: Lowella Grip III M.D.   On: 01/13/2020 08:46   MR FOOT RIGHT WO CONTRAST  Result Date: 01/13/2020 CLINICAL DATA:  Diabetic foot ulcer EXAM: MRI OF THE RIGHT FOREFOOT WITHOUT CONTRAST TECHNIQUE: Multiplanar, multisequence MR imaging of the right foot was performed. No intravenous contrast was administered. COMPARISON:  X-ray 01/12/2020, 03/01/2018 FINDINGS: Bones/Joint/Cartilage Chronic changes to the first metatarsal head with subchondral collapse and subchondral cystic change. There is reactive sclerosis and subchondral cystic change of the opposing great toe proximal phalanx base, more pronounced along the medial aspect. Small first MTP joint effusion. The medial hallux sesamoid also appears sclerotic with heterogeneous T2 hyperintense appearance which may reflect sequela of chronic sesamoiditis. Mild degenerative changes within the midfoot including at the second metatarsal base and within the lateral  cuneiform. No acute fracture. No dislocation. No bony erosion or cortical destruction. Ligaments Intact Lisfranc ligament. Collateral ligaments of the forefoot appear intact. Muscles and Tendons Fatty atrophy of the intrinsic foot musculature suggesting chronic denervation changes. Intact flexor and extensor tendons without tenosynovitis. Soft tissues Plantar soft tissue ulceration underlies the medial forefoot at the level of the first MTP joint. Ulcer base is contiguous with a fluid and air containing collection measuring approximately 2.1 x 0.8 x 1.8 cm (series 9, image 20; series 5, image 16) suggesting a developing abscess. IMPRESSION: 1. Plantar soft tissue ulceration underlies the medial forefoot at the level of the first MTP joint. Ulcer base is contiguous with a fluid and air containing collection measuring 2.1 x 0.8 x 1.8 cm suggesting a developing abscess. 2. Chronic bony changes to the first metatarsal head and great toe proximal phalanx with subchondral collapse and subchondral cystic change suggesting osteonecrosis. Findings do not appear appreciably changed compared to multiple prior radiographs. Although felt to be less likely, signal changes related to osteomyelitis would be difficult to entirely exclude. 3. Small first MTP joint effusion is likely reactive. Septic arthritis not excluded. 4. Additional findings suggestive of chronic sesamoiditis. Electronically Signed   By: Davina Poke D.O.   On: 01/13/2020 10:34   US Venous Img Lower Unilateral Right (DVT)  Result Date: 01/12/2020 CLINICAL DATA:  Calf and popliteal fossa pain x1 week, edema, redness EXAM: RIGHT LOWER EXTREMITY VENOUS DOPPLER ULTRASOUND TECHNIQUE: Gray-scale sonography with compression, as well as color and duplex ultrasound, were performed to evaluate the deep venous system(s) from the level of the common femoral vein through the popliteal and proximal calf veins. COMPARISON:  None. FINDINGS: VENOUS Normal compressibility  of the  common femoral, superficial femoral, and popliteal veins, as well as the visualized calf veins. Visualized portions of profunda femoral vein and great saphenous vein unremarkable. No filling defects to suggest DVT on grayscale or color Doppler imaging. Doppler waveforms show normal direction of venous flow, normal respiratory phasicity and response to augmentation. Limited views of the contralateral common femoral vein are unremarkable. OTHER Hypoechoic thrombus in a noncompressible right short saphenous vein, and in the great saphenous vein below the knee. Hypoechoic right inguinal lymph node 1.1 cm short axis diameter. Limitations: none IMPRESSION: 1. Negative for DVT. 2. Superficial thrombophlebitis involving right short saphenous vein, and right GSV below the knee. Electronically Signed   By: Lucrezia Europe M.D.   On: 01/12/2020 16:11   US ARTERIAL ABI (SCREENING LOWER EXTREMITY)  Result Date: 01/13/2020 CLINICAL DATA:  59 year old male with a history of foot wound on right EXAM: NONINVASIVE PHYSIOLOGIC VASCULAR STUDY OF BILATERAL LOWER EXTREMITIES TECHNIQUE: Evaluation of both lower extremities was performed at rest, including calculation of ankle-brachial indices, multiple segmental pressure evaluation, segmental Doppler and segmental pulse volume recording. COMPARISON:  None. FINDINGS: Right ABI:  1.04 Left ABI:  1.09 Right Lower Extremity: Segmental Doppler at the right ankle demonstrates monophasic dorsalis pedis and posterior tibial artery Left Lower Extremity: Segmental Doppler on the left demonstrates triphasic posterior tibial artery and monophasic dorsalis pedis IMPRESSION: Right: Resting ABI within normal limits, though potentially falsely elevated. Segmental Doppler at the ankle demonstrates monophasic waveforms, indicating more proximal occlusive disease. Left: Resting ABI within normal limits, though potentially falsely elevated. Segmental exam demonstrates waveform maintained in the  posterior tibial artery and monophasic in the dorsalis pedis. Signed, Dulcy Fanny. Dellia Nims, RPVI [facility] Radiology Electronically Signed   By: Corrie Mckusick D.O.   On: 01/13/2020 13:49   DG Foot Complete Right  Result Date: 01/12/2020 CLINICAL DATA:  Diabetic foot infection. EXAM: RIGHT FOOT COMPLETE - 3+ VIEW COMPARISON:  April 05, 2019 FINDINGS: There is no evidence of an acute fracture or dislocation. A chronic fracture of the proximal phalanx of the fifth right toe is noted. There is a large plantar calcaneal spur. Moderate severity degenerative changes are seen involving the metatarsophalangeal joint of the right great toe. Moderate to marked severity vascular calcification is seen. A mild amount of soft tissue air is seen along the plantar aspect of the distal right foot. Mild to moderate severity associated soft tissue swelling is seen. IMPRESSION: 1. Mild amount of soft tissue air along the plantar aspect of the distal right foot. 2. Chronic and degenerative changes, as described above. Electronically Signed   By: Virgina Norfolk M.D.   On: 01/12/2020 16:35    ROS:  Pertinent items are noted in HPI.  Blood pressure 131/86, pulse 76, temperature 99.2 F (37.3 C), temperature source Oral, resp. rate 20, height 5\' 9"  (1.753 m), weight 97.1 kg, SpO2 97 %. Physical Exam: Pleasant black male no acute distress Head is normocephalic, atraumatic Lungs clear to auscultation with good breath sounds bilaterally Extremity examination reveals bilateral femoral pulses.  Right foot examination reveals an opening in the central aspect of the callus along the plantar aspect over the first metatarsal head.  Serosanguineous drainage is present.  It freely drains from the wound.  The right foot is slightly swollen and there is some edematous changes in the right lower extremity. Arterial segmental Doppler studies reviewed MRI  reviewed  Assessment/Plan: Impression: Diabetic foot cellulitis with draining abscess, right.  At this  point, I do not feel that further surgical intervention is warranted as the patient seems to be improving on IV antibiotics and the wound is freely draining. Plan: Will start foot soaks with Betadine twice a day.  Will follow with you.  Raymond [name] 01/14/2020, 9:22 AM

## 2020-01-14 NOTE — Progress Notes (Signed)
*  PRELIMINARY RESULTS* Echocardiogram 2D Echocardiogram has been performed.  Leavy Cella 01/14/2020, 9:58 AM

## 2020-01-14 NOTE — Consult Note (Signed)
Date of Admission:  01/12/2020          Reason for Consult: MSSA bacteremia    Referring Provider: Auto consult   Assessment:  1. MRSA bacteremia likely from  2. Diabetic foot ulcer 3. Renal transplant  Plan:  1. Continue cubicin 2. Repeat blood cultures 3. I think he will need more definitive surgery on foot eventually 4. He needs at LEAST 6 weeks of IV therapy 5. 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 amLODipine  5 mg Oral Daily  . aspirin EC  162 mg Oral Daily  . doxazosin  4 mg Oral Daily  . heparin  5,000 Units Subcutaneous Q8H  . hydrALAZINE  25 mg Oral BID  . insulin aspart  0-15 Units Subcutaneous TID WC  . insulin aspart  0-5 Units Subcutaneous QHS  . insulin aspart  4 Units Subcutaneous TID WC  . insulin glargine  30 Units Subcutaneous BID  . multivitamin  1 tablet Oral Daily  . pantoprazole  40 mg Oral Daily  . tacrolimus  3 mg Oral BID  . Vitamin D (Ergocalciferol)  50,000 Units Oral Q30 days   Continuous Infusions: . sodium chloride 75 mL/hr at 01/12/20 2030  . sodium chloride 75 mL/hr at 01/12/20 1801  . DAPTOmycin (CUBICIN)  IV 650 mg (01/13/20 2319)   PRN Meds:.  HPI: Raymond [name] is a 59 y.o. 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 He was admitted and blood cultures taken which are growing MRSA. He has had MRI of foot (reviewed) which does not show clear cut osteomyelitis though I am going to treat him as having such. Repeat blood cultures. NO CENTRAL line yet. TTE and TEE.   Review of Systems: Review of Systems  Unable to perform ROS: Other    Past Medical History:  Diagnosis Date  . Chronic kidney disease    Awaiting transplant  . Diabetes ([place])   . Diabetes mellitus without complication ([place])   . Hypertension   . Renal disorder   . Renal insufficiency     Social History   Tobacco Use  . Smoking status: Never Smoker  . Smokeless tobacco: Never Used  Vaping Use  . Vaping Use: Never used  Substance Use Topics  . Alcohol use: No    Alcohol/week: 0.0 standard drinks  . Drug use: No    Family History  Problem Relation Age of Onset  . Diabetes Mother   . Heart disease Father   . Colon cancer Neg Hx    No Known Allergies  OBJECTIVE: Blood pressure 131/86, pulse 76,  temperature 99.2 F (37.3 C), temperature source Oral, resp. rate 20, height 5\' 9"  (1.753 m), weight 97.1 kg, SpO2 97 %.  Physical Exam Vitals reviewed.     Lab Results Lab Results  Component Value Date   WBC 8.4 01/13/2020   HGB 11.3 (L) 01/13/2020   HCT 37.0 (L) 01/13/2020   MCV 83.5 01/13/2020   PLT 66 (L) 01/13/2020    Lab Results  Component Value Date   CREATININE 1.71 (H) 01/13/2020   BUN 60 (H) 01/13/2020   NA 136 01/13/2020   K 3.8 01/13/2020   CL 109 01/13/2020   CO2 18 (L) 01/13/2020    Lab Results  Component Value Date   ALT 17 01/13/2020   AST 15 01/13/2020   ALKPHOS 97 01/13/2020   BILITOT 0.8 01/13/2020     Microbiology: Recent Results (from the past 240 hour(s))  SARS Coronavirus 2 by RT PCR (hospital order, performed in [facility] hospital lab) Nasopharyngeal Nasopharyngeal Swab     Status: None   Collection Time: 01/12/20  4:06 PM   Specimen: Nasopharyngeal Swab  Result Value Ref Range Status   SARS Coronavirus 2 NEGATIVE NEGATIVE Final    Comment: (NOTE) SARS-CoV-2 target nucleic acids are NOT DETECTED.  The SARS-CoV-2 RNA is generally detectable in upper and lower respiratory specimens during the acute phase of infection. The lowest concentration of SARS-CoV-2 viral copies this assay can detect is 250 copies / mL. A negative result does not preclude SARS-CoV-2 infection and should not be used as the sole basis for treatment or other patient management decisions.  A negative result may occur with improper specimen collection / handling, submission of specimen other than nasopharyngeal swab, presence of viral mutation(s) within the areas targeted by this assay, and inadequate number of viral copies (<250 copies / mL). A negative result must be combined with clinical observations, patient history, and epidemiological information.  Fact Sheet for Patients:   StrictlyIdeas.no  Fact Sheet for Healthcare Providers: BankingDealers.co.za  This test is not yet approved or  cleared by the [place] FDA and has been authorized for detection and/or diagnosis of SARS-CoV-2 by FDA under an Emergency Use Authorization (EUA).  This EUA will remain in effect (meaning this test can be used) for the duration of the COVID-19 declaration under Section 564(b)(1) of the Act, 21 U.S.C. section 360bbb-3(b)(1), unless the authorization is terminated or revoked sooner.  Performed at [facility], [address], [place], [place] [address]   Culture, blood (routine x 2)     Status: Abnormal (Preliminary result)   Collection Time: 01/12/20  6:16 PM   Specimen: BLOOD RIGHT HAND  Result Value Ref Range Status   Specimen Description   Final    BLOOD RIGHT HAND Performed at  [facility], [address], [place], [place] [address]    Special Requests   Final    BOTTLES DRAWN AEROBIC AND ANAEROBIC Blood Culture adequate volume Performed at [facility], [address] [place], [place] [address]    Culture  Setup Time   Final    ANAEROBIC and AEROBIC BOTTLE GRAM POSITIVE COCCI Gram Stain Report Called to,Read Back By and Verified With: DEAN,T@1103  BY MATTHEWS, B 8.20.2021 [facility] Organism ID to follow CRITICAL RESULT CALLED TO, READ BACK BY AND VERIFIED WITH: A,MUHAMMAD RN @1947  01/13/20 EB Performed at [facility] [address], [place], [place] [address]    Culture STAPHYLOCOCCUS AUREUS ENTEROCOCCUS FAECALIS  (A)  Final  Report Status PENDING  Incomplete  Culture, blood (routine x 2)     Status: Abnormal (Preliminary result)   Collection Time: 01/12/20  6:16 PM   Specimen: BLOOD RIGHT HAND  Result Value Ref Range Status   Specimen Description   Final    BLOOD RIGHT HAND Performed at [facility], [address], [place], [place] [address]    Special Requests   Final    BOTTLES DRAWN AEROBIC AND ANAEROBIC Blood Culture adequate volume Performed at [facility], [address] [place], [place] [address]    Culture  Setup Time   Final    ANAEROBIC AND AEROBIC BOTTLES GRAM POSITIVE COCCI Gram Stain Report Called to,Read Back By and Verified With: DEAN,T@1103  BY MATTHEWS, B 8.20.21 [facility] CRITICAL VALUE NOTED.  VALUE IS CONSISTENT WITH PREVIOUSLY REPORTED AND CALLED VALUE.    Culture (A)  Final    STAPHYLOCOCCUS AUREUS SUSCEPTIBILITIES PERFORMED ON PREVIOUS CULTURE WITHIN THE LAST 5 DAYS. Performed at [facility], [place] [address] [place], [place] [address]    Report Status PENDING  Incomplete  Blood Culture ID Panel (Reflexed)     Status: Abnormal   Collection Time: 01/12/20  6:16 PM  Result Value Ref Range Status   Enterococcus faecalis NOT DETECTED NOT DETECTED Final   Enterococcus Faecium NOT DETECTED NOT  DETECTED Final   Listeria monocytogenes NOT DETECTED NOT DETECTED Final   Staphylococcus species DETECTED (A) NOT DETECTED Final    Comment: RESULT CALLED TO, READ BACK BY AND VERIFIED WITH: A,MUHAMMAD RN @1947  01/13/20 EB    Staphylococcus aureus (BCID) DETECTED (A) NOT DETECTED Final    Comment: Methicillin (oxacillin)-resistant Staphylococcus aureus (MRSA). MRSA is predictably resistant to beta-lactam antibiotics (except ceftaroline). Preferred therapy is vancomycin unless clinically contraindicated. Patient requires contact precautions if  hospitalized. RESULT CALLED TO, READ BACK BY AND VERIFIED WITH: A,MUHAMMAD RN @1947  01/13/20 EB    Staphylococcus epidermidis NOT DETECTED NOT DETECTED Final   Staphylococcus lugdunensis NOT DETECTED NOT DETECTED Final   Streptococcus species NOT DETECTED NOT DETECTED Final   Streptococcus agalactiae NOT DETECTED NOT DETECTED Final   Streptococcus pneumoniae NOT DETECTED NOT DETECTED Final   Streptococcus pyogenes NOT DETECTED NOT DETECTED Final   A.calcoaceticus-baumannii NOT DETECTED NOT DETECTED Final   Bacteroides fragilis NOT DETECTED NOT DETECTED Final   Enterobacterales NOT DETECTED NOT DETECTED Final   Enterobacter cloacae complex NOT DETECTED NOT DETECTED Final   Escherichia coli NOT DETECTED NOT DETECTED Final   Klebsiella aerogenes NOT DETECTED NOT DETECTED Final   Klebsiella oxytoca NOT DETECTED NOT DETECTED Final   Klebsiella pneumoniae NOT DETECTED NOT DETECTED Final   Proteus species NOT DETECTED NOT DETECTED Final   Salmonella species NOT DETECTED NOT DETECTED Final   Serratia marcescens NOT DETECTED NOT DETECTED Final   Haemophilus influenzae NOT DETECTED NOT DETECTED Final   Neisseria meningitidis NOT DETECTED NOT DETECTED Final   Pseudomonas aeruginosa NOT DETECTED NOT DETECTED Final   Stenotrophomonas maltophilia NOT DETECTED NOT DETECTED Final   Candida albicans NOT DETECTED NOT DETECTED Final   Candida auris NOT DETECTED  NOT DETECTED Final   Candida glabrata NOT DETECTED NOT DETECTED Final   Candida krusei NOT DETECTED NOT DETECTED Final   Candida parapsilosis NOT DETECTED NOT DETECTED Final   Candida tropicalis NOT DETECTED NOT DETECTED Final   Cryptococcus neoformans/gattii NOT DETECTED NOT DETECTED Final   Meth resistant mecA/C and MREJ DETECTED (A) NOT DETECTED Final    Comment: RESULT CALLED TO, READ BACK  BY AND VERIFIED WITH: A,MUHAMMAD RN @1947  01/13/20 EB Performed at [facility] [address] [place], [place] [address]   Culture, blood (Routine X 2) w Reflex to ID Panel     Status: None (Preliminary result)   Collection Time: 01/13/20  9:04 PM   Specimen: BLOOD RIGHT HAND  Result Value Ref Range Status   Specimen Description BLOOD RIGHT HAND  Final   Special Requests   Final    BOTTLES DRAWN AEROBIC AND ANAEROBIC Blood Culture adequate volume   Culture   Final    NO GROWTH < 12 HOURS Performed at [facility], [address], [place], [place] [address]    Report Status PENDING  Incomplete  Culture, blood (Routine X 2) w Reflex to ID Panel     Status: None (Preliminary result)   Collection Time: 01/13/20  9:04 PM   Specimen: BLOOD RIGHT HAND  Result Value Ref Range Status   Specimen Description BLOOD RIGHT HAND  Final   Special Requests   Final    BOTTLES DRAWN AEROBIC AND ANAEROBIC Blood Culture adequate volume   Culture   Final    NO GROWTH < 12 HOURS Performed at [facility], [address] [place], [place] [address]    Report Status PENDING  Incomplete    Alcide Evener, [facility] [phone] pager  01/14/2020, 2:13 PM

## 2020-01-14 NOTE — Progress Notes (Signed)
Removed dressing to right foot, small amount of purulent drainage present on gauze. Removed aquacel using normal saline as it had adhered to the wound. Upon removal of aquacel, a large amount of serosanguinous drainage came out of the wound. I cleaned the site with gauze and sterile water, applied aquacel, wrapped with kerlex and applied a new sock. Pt tolerated well. Pt did express mild pain to the Right lower leg.

## 2020-01-15 LAB — GLUCOSE, CAPILLARY
Glucose-Capillary: 150 mg/dL — ABNORMAL HIGH (ref 70–99)
Glucose-Capillary: 196 mg/dL — ABNORMAL HIGH (ref 70–99)
Glucose-Capillary: 120 mg/dL — ABNORMAL HIGH (ref 70–99)

## 2020-01-15 MED ORDER — POVIDONE-IODINE 10 % EX SOLN
CUTANEOUS | Status: AC
  Administered 2020-01-15: 1
  Filled 2020-01-15: qty 15

## 2020-01-15 NOTE — Progress Notes (Signed)
PROGRESS NOTE    Raymond [name]  PFY:[id number] DOB: 28-Jul-1960 DOA: 01/12/2020 PCP: [name], FNP    Chief Complaint  Patient presents with  .  Right diabetic foot ulcer.    Brief Narrative:  As per H&P written by Dr. Waldron Labs on 01/12/2020  Raymond [name]  is a 59 y.o. 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, it was sent by podiatry for evaluation for wound in the right plantar foot, it was seen today by podiatry, and he had a concern of worsening foot infection, so he was sent to ED for further evaluation, patient reports symptoms has been going on for last 2 weeks, and currently he is with purulent foul-smelling odor, as well there was a concern for possible DVT in his right lower extremity given swelling, patient was at [facility] ED yesterday, and he left prior to getting his imaging study done, labs during that visit showing elevated D-dimers greater than 19, patient himself denies any chest pain, palpitation, dizziness, he does report mild dyspnea, report he has been compliant with his Lantus and glipizide. - in ED work-up significant for pseudohyponatremia sodium of 128, creatinine of 2.1 from baseline 1.3, glucose of 609, but normal anion gap, and bicarb of 21, had low platelet at 81, negative, he is unvaccinated, started on vancomycin, [facility] hospitalist consulted to admit.   Assessment & Plan: 1-right diabetic foot ulcer/MRSA bacteremia: Plantar aspect  -Open wound with purulent discharge -Present for the last 2 weeks and worsening -No tenderness secondary to neuropathy -High risk for becoming diffusely infected as patient has underlying history immunosuppressant due to renal transplant status.   -Continue current antibiotics, on Daptomycin. -ID has recommended to check TEE. -MRI suggesting developing abscess and osteonecrosis;  general surgery has been consulted -Will follow recommendations. -Continue wound care and soaking bath with Bethadine, no surgical intervention currently.   2-Acute kidney injury on chronic kidney disease; patient with prior history of renal transplant -has chronically Remained in a stage IIIa renal failure. -Continue minimizing nephrotoxic agents -Follow renal function trend -Gentle fluid resuscitation provided. -Continue Prograf. -Cr at baseline now, will monitor trend.  3-Immunosuppression ([place]) -Continue Prograf  -History of renal transplant.  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 -Follow CBGs -Modified carbohydrate diet has been encouraged. -Close follow-up after discharge with patient's endocrinologist has been recommended.  5-Essential hypertension, benign -Stable overall -Continue current antihypertensive regimen. -Follow vital signs.  6-Class 1 obesity due to excess calories with serious comorbidity and body mass index (BMI) of 31.0 to 31.9 in adult -Low calorie diet, portion control and increase physical activity discussed with patient -Body mass index is 31.6 kg/m.  7-elevated D-dimer/right lower extremity superficial thrombophlebitis -No DVT and SVT not close to femoral vein, for that no anticoagulation required -VQ scan also negative for concerns of pulmonary embolism. -Continue supportive care, TEd hoses usage and 162mg  of Aspirin.    DVT prophylaxis: Heparin Code Status: Full code Family Communication: No family at bedside. Disposition:   Status is: Inpatient  Dispo: The patient is from: Home              Anticipated d/c is to: Home              Anticipated d/c date is: 2-3 days  Patient currently no medically stable for discharge; right foot diabetic ulcer open and actively draining with positive MRSA blood cultures.   Continue current use of IV daptomycin and follow  TEE results, n.p.o. after midnight in case TEE can be done tomorrow. Will repeat blood cx's. Some mild osteonecrosis appreciated but no frank osteomyelitis.  Appreciate assistance by general surgery and infectious disease services.  Continue wound care and current IV antibiotics.    Consultants:   General surgery  Infectious disease (Dr. Tommy Medal) recommendations given to use daptomycin and to check TEE.  Cardiology service: Consulted for TEE on Monday   Procedures:  See below for x-ray report   Antimicrobials:  Daptomycin 01/12/20 ceftriaxone and Flagyl 01/12/20>>>01/14/20   Subjective: No fever, no chest pain, no nausea, no vomiting, no shortness of breath.  Right plantar wound with improvement in drainage, no surrounding erythema.  Still complaining of right lower extremity pain.  Objective: Vitals:   01/14/20 0539 01/14/20 1330 01/14/20 1958 01/15/20 0408  BP: 131/86 127/84 119/68 121/66  Pulse: 76 76 63 71  Resp: 20 20 16 16   Temp: 99.2 F (37.3 C) 98.9 F (37.2 C) 98.7 F (37.1 C) 98 F (36.7 C)  TempSrc: Oral Oral Oral Oral  SpO2: 97% 97% 95% 98%  Weight:      Height:        Intake/Output Summary (Last 24 hours) at 01/15/2020 1338 Last data filed at 01/15/2020 0900 Gross per 24 hour  Intake 960 ml  Output --  Net 960 ml   Filed Weights   01/12/20 1253  Weight: 97.1 kg    Examination: General exam: Alert, awake, oriented x 3, continue having pain on his right lower extremity.  No fever, no pain in the right foot, reports continue having purulent drainage coming out of diabetic foot ulcer.  No nausea, no vomiting, no shortness of breath, no chest pain. Respiratory system: Clear to auscultation. Respiratory effort normal. Cardiovascular system:RRR. No murmurs, rubs, gallops. Gastrointestinal system: Abdomen is nondistended, soft and nontender. No organomegaly or masses felt. Normal bowel sounds heard. Central  nervous system: Alert and oriented. No focal neurological deficits. Extremities: No cyanosis or clubbing; right lower extremity with trace edema and induration in the inner aspect just below his knee.  Tenderness to palpation has been reported. Skin: No petechiae; no erythema or rashes.  Diabetic foot ulcer on the right (affecting plantar area) with improvement in purulent drainage, no surrounding erythema or pain to palpation. Psychiatry: Judgement and insight appear normal. Mood & affect appropriate.    Data Reviewed: I have personally reviewed following labs and imaging studies  CBC: Recent Labs  Lab 01/12/20 1328 01/13/20 0722  WBC 6.0 8.4  NEUTROABS 5.0  --   HGB 14.1 11.3*  HCT 45.2 37.0*  MCV 82.8 83.5  PLT 81* 66*    Basic Metabolic Panel: Recent Labs  Lab 01/12/20 1328 01/13/20 0722  NA 128* 136  K 4.7 3.8  CL 96* 109  CO2 21* 18*  GLUCOSE 609* 166*  BUN 55* 60*  CREATININE 2.10* 1.71*  CALCIUM 9.7 8.4*    GFR: Estimated Creatinine Clearance: 53.5 mL/min (A) (by C-G formula based on SCr of 1.71 mg/dL (H)).  Liver Function Tests: Recent Labs  Lab 01/13/20 0722  AST 15  ALT 17  ALKPHOS 97  BILITOT 0.8  PROT 6.1*  ALBUMIN 2.5*    CBG: Recent Labs  Lab 01/13/20 1125 01/13/20 1658 01/13/20 2136 01/14/20 1705 01/15/20 0727  GLUCAP 221*  194* 143* 197* 150*     Recent Results (from the past 240 hour(s))  SARS Coronavirus 2 by RT PCR (hospital order, performed in [facility] hospital lab) Nasopharyngeal Nasopharyngeal Swab     Status: None   Collection Time: 01/12/20  4:06 PM   Specimen: Nasopharyngeal Swab  Result Value Ref Range Status   SARS Coronavirus 2 NEGATIVE NEGATIVE Final    Comment: (NOTE) SARS-CoV-2 target nucleic acids are NOT DETECTED.  The SARS-CoV-2 RNA is generally detectable in upper and lower respiratory specimens during the acute phase of infection. The lowest concentration of SARS-CoV-2 viral copies this assay can detect  is 250 copies / mL. A negative result does not preclude SARS-CoV-2 infection and should not be used as the sole basis for treatment or other patient management decisions.  A negative result may occur with improper specimen collection / handling, submission of specimen other than nasopharyngeal swab, presence of viral mutation(s) within the areas targeted by this assay, and inadequate number of viral copies (<250 copies / mL). A negative result must be combined with clinical observations, patient history, and epidemiological information.  Fact Sheet for Patients:   StrictlyIdeas.no  Fact Sheet for Healthcare Providers: BankingDealers.co.za  This test is not yet approved or  cleared by the [place] FDA and has been authorized for detection and/or diagnosis of SARS-CoV-2 by FDA under an Emergency Use Authorization (EUA).  This EUA will remain in effect (meaning this test can be used) for the duration of the COVID-19 declaration under Section 564(b)(1) of the Act, 21 U.S.C. section 360bbb-3(b)(1), unless the authorization is terminated or revoked sooner.  Performed at [facility], [address], [place], [place] [address]   Culture, blood (routine x 2)     Status: Abnormal   Collection Time: 01/12/20  6:16 PM   Specimen: BLOOD RIGHT HAND  Result Value Ref Range Status   Specimen Description   Final    BLOOD RIGHT HAND Performed at [facility], [address] [place], [place] [address]    Special Requests   Final    BOTTLES DRAWN AEROBIC AND ANAEROBIC Blood Culture adequate volume Performed at [address] [place], [place] [address]    Culture  Setup Time   Final    ANAEROBIC and AEROBIC BOTTLE GRAM POSITIVE COCCI Gram Stain Report Called to,Read Back By and Verified With: DEAN,T@1103  BY MATTHEWS, B 8.20.2021 [facility] Organism ID to follow CRITICAL RESULT CALLED TO, READ BACK BY AND VERIFIED WITH:  A,MUHAMMAD RN @1947  01/13/20 EB    Culture (A)  Final    METHICILLIN RESISTANT STAPHYLOCOCCUS AUREUS ENTEROCOCCUS FAECALIS CRITICAL RESULT CALLED TO, READ BACK BY AND VERIFIED WITH: Tally Due [id number] FCP Performed at [facility], [place] [address], [place], [place] [address]    Report Status 01/15/2020 FINAL  Final   Organism ID, Bacteria METHICILLIN RESISTANT STAPHYLOCOCCUS AUREUS  Final   Organism ID, Bacteria ENTEROCOCCUS FAECALIS  Final      Susceptibility   Enterococcus faecalis - MIC*    AMPICILLIN <=2 SENSITIVE Sensitive     VANCOMYCIN 1 SENSITIVE Sensitive     GENTAMICIN SYNERGY SENSITIVE Sensitive     * ENTEROCOCCUS FAECALIS   Methicillin resistant staphylococcus aureus - MIC*    CIPROFLOXACIN >=8 RESISTANT Resistant     ERYTHROMYCIN >=8 RESISTANT Resistant     GENTAMICIN <=0.5 SENSITIVE Sensitive     OXACILLIN >=4 RESISTANT Resistant     TETRACYCLINE >=16 RESISTANT Resistant  VANCOMYCIN <=0.5 SENSITIVE Sensitive     TRIMETH/SULFA <=10 SENSITIVE Sensitive     CLINDAMYCIN <=0.25 SENSITIVE Sensitive     RIFAMPIN <=0.5 SENSITIVE Sensitive     Inducible Clindamycin NEGATIVE Sensitive     * METHICILLIN RESISTANT STAPHYLOCOCCUS AUREUS  Culture, blood (routine x 2)     Status: Abnormal   Collection Time: 01/12/20  6:16 PM   Specimen: BLOOD RIGHT HAND  Result Value Ref Range Status   Specimen Description   Final    BLOOD RIGHT HAND Performed at [facility], [address], [place], [place] [address]    Special Requests   Final    BOTTLES DRAWN AEROBIC AND ANAEROBIC Blood Culture adequate volume Performed at [facility], [address] [place], [place] [address]    Culture  Setup Time   Final    ANAEROBIC AND AEROBIC BOTTLES GRAM POSITIVE COCCI Gram Stain Report Called to,Read Back By and Verified With: DEAN,T@1103  BY MATTHEWS, B 8.20.21 [place] CRITICAL VALUE NOTED.  VALUE IS CONSISTENT WITH PREVIOUSLY REPORTED AND CALLED VALUE.     Culture (A)  Final    STAPHYLOCOCCUS AUREUS ENTEROCOCCUS FAECALIS SUSCEPTIBILITIES PERFORMED ON PREVIOUS CULTURE WITHIN THE LAST 5 DAYS. Performed at [facility], [place] [address], [place], [place] [address]    Report Status 01/15/2020 FINAL  Final  Blood Culture ID Panel (Reflexed)     Status: Abnormal   Collection Time: 01/12/20  6:16 PM  Result Value Ref Range Status   Enterococcus faecalis NOT DETECTED NOT DETECTED Final   Enterococcus Faecium NOT DETECTED NOT DETECTED Final   Listeria monocytogenes NOT DETECTED NOT DETECTED Final   Staphylococcus species DETECTED (A) NOT DETECTED Final    Comment: RESULT CALLED TO, READ BACK BY AND VERIFIED WITH: A,MUHAMMAD RN @1947  01/13/20 EB    Staphylococcus aureus (BCID) DETECTED (A) NOT DETECTED Final    Comment: Methicillin (oxacillin)-resistant Staphylococcus aureus (MRSA). MRSA is predictably resistant to beta-lactam antibiotics (except ceftaroline). Preferred therapy is vancomycin unless clinically contraindicated. Patient requires contact precautions if  hospitalized. RESULT CALLED TO, READ BACK BY AND VERIFIED WITH: A,MUHAMMAD RN @1947  01/13/20 EB    Staphylococcus epidermidis NOT DETECTED NOT DETECTED Final   Staphylococcus lugdunensis NOT DETECTED NOT DETECTED Final   Streptococcus species NOT DETECTED NOT DETECTED Final   Streptococcus agalactiae NOT DETECTED NOT DETECTED Final   Streptococcus pneumoniae NOT DETECTED NOT DETECTED Final   Streptococcus pyogenes NOT DETECTED NOT DETECTED Final   A.calcoaceticus-baumannii NOT DETECTED NOT DETECTED Final   Bacteroides fragilis NOT DETECTED NOT DETECTED Final   Enterobacterales NOT DETECTED NOT DETECTED Final   Enterobacter cloacae complex NOT DETECTED NOT DETECTED Final   Escherichia coli NOT DETECTED NOT DETECTED Final   Klebsiella aerogenes NOT DETECTED NOT DETECTED Final   Klebsiella oxytoca NOT DETECTED NOT DETECTED Final   Klebsiella pneumoniae NOT DETECTED NOT DETECTED  Final   Proteus species NOT DETECTED NOT DETECTED Final   Salmonella species NOT DETECTED NOT DETECTED Final   Serratia marcescens NOT DETECTED NOT DETECTED Final   Haemophilus influenzae NOT DETECTED NOT DETECTED Final   Neisseria meningitidis NOT DETECTED NOT DETECTED Final   Pseudomonas aeruginosa NOT DETECTED NOT DETECTED Final   Stenotrophomonas maltophilia NOT DETECTED NOT DETECTED Final   Candida albicans NOT DETECTED NOT DETECTED Final   Candida auris NOT DETECTED NOT DETECTED Final   Candida glabrata NOT DETECTED NOT DETECTED Final   Candida krusei NOT DETECTED NOT DETECTED Final   Candida parapsilosis NOT DETECTED NOT  DETECTED Final   Candida tropicalis NOT DETECTED NOT DETECTED Final   Cryptococcus neoformans/gattii NOT DETECTED NOT DETECTED Final   Meth resistant mecA/C and MREJ DETECTED (A) NOT DETECTED Final    Comment: RESULT CALLED TO, READ BACK BY AND VERIFIED WITH: A,MUHAMMAD RN @1947  01/13/20 EB Performed at [facility], [place] [address], [place], [place] [address]   Culture, blood (Routine X 2) w Reflex to ID Panel     Status: None (Preliminary result)   Collection Time: 01/13/20  9:04 PM   Specimen: BLOOD RIGHT HAND  Result Value Ref Range Status   Specimen Description BLOOD RIGHT HAND  Final   Special Requests   Final    BOTTLES DRAWN AEROBIC AND ANAEROBIC Blood Culture adequate volume   Culture   Final    NO GROWTH 2 DAYS Performed at [facility], [address] [place], [place] [address]    Report Status PENDING  Incomplete  Culture, blood (Routine X 2) w Reflex to ID Panel     Status: None (Preliminary result)   Collection Time: 01/13/20  9:04 PM   Specimen: BLOOD RIGHT HAND  Result Value Ref Range Status   Specimen Description BLOOD RIGHT HAND  Final   Special Requests   Final    BOTTLES DRAWN AEROBIC AND ANAEROBIC Blood Culture adequate volume   Culture   Final    NO GROWTH 2 DAYS Performed at [facility], [address] [place], [place]  [address]    Report Status PENDING  Incomplete     Radiology Studies: ECHOCARDIOGRAM COMPLETE  Result Date: 01/14/2020    ECHOCARDIOGRAM REPORT   Patient Name:   AUTHUR CUBIT Date of Exam: 01/14/2020 Medical Rec #:  [id number]       Height:       69.0 in Accession #:    [id number]      Weight:       214.0 lb Date of Birth:  1960/06/26       BSA:          2.126 m Patient Age:    74 years        BP:           131/86 mmHg Patient Gender: M               HR:           76 bpm. Exam Location:  [facility] Procedure: 2D Echo Indications:    Bacteremia 790.7 / R78.81  History:        Patient has no prior history of Echocardiogram examinations.                 Risk Factors:Dyslipidemia, Non-Smoker, Diabetes and                 Hypertension. Acute Kidney Injury, GERD.  Sonographer:    Leavy Cella RDCS (AE) Referring Phys: [place]  1. Left ventricular ejection fraction, by estimation, is 55 to 60%. The left ventricle has normal function. The left ventricle has no regional wall motion abnormalities. There is moderate left ventricular hypertrophy. Left ventricular diastolic parameters are indeterminate.  2. Right ventricular systolic function is normal. The right ventricular size is normal. There is normal pulmonary artery systolic pressure.  3. The mitral valve is normal in structure. No evidence of mitral valve regurgitation.  4. The aortic valve is tricuspid. Aortic valve regurgitation is not visualized. Mild aortic valve sclerosis is present, with no  evidence of aortic valve stenosis.  5. The inferior vena cava is normal in size with greater than 50% respiratory variability, suggesting right atrial pressure of 3 mmHg. Conclusion(s)/Recommendation(s): No vegetation seen. If high clinical suspicion for endocarditis, consider TEE. FINDINGS  Left Ventricle: Left ventricular ejection fraction, by estimation, is 55 to 60%. The left ventricle has normal function. The left ventricle has no  regional wall motion abnormalities. The left ventricular internal cavity size was small. There is moderate  left ventricular hypertrophy. Left ventricular diastolic parameters are indeterminate. Right Ventricle: The right ventricular size is normal. Right vetricular wall thickness was not assessed. Right ventricular systolic function is normal. There is normal pulmonary artery systolic pressure. The tricuspid regurgitant velocity is 1.57 m/s, and with an assumed right atrial pressure of 10 mmHg, the estimated right ventricular systolic pressure is 24.2 mmHg. Left Atrium: Left atrial size was normal in size. Right Atrium: Right atrial size was normal in size. Pericardium: There is no evidence of pericardial effusion. Mitral Valve: The mitral valve is normal in structure. No evidence of mitral valve regurgitation. Tricuspid Valve: The tricuspid valve is normal in structure. Tricuspid valve regurgitation is trivial. Aortic Valve: The aortic valve is tricuspid. Aortic valve regurgitation is not visualized. Mild aortic valve sclerosis is present, with no evidence of aortic valve stenosis. Pulmonic Valve: The pulmonic valve was not well visualized. Pulmonic valve regurgitation is not visualized. Aorta: The aortic root is normal in size and structure. Venous: The inferior vena cava is normal in size with greater than 50% respiratory variability, suggesting right atrial pressure of 3 mmHg. IAS/Shunts: The interatrial septum was not well visualized.  LEFT VENTRICLE PLAX 2D LVIDd:         2.94 cm  Diastology LVIDs:         2.08 cm  LV e' lateral:   6.09 cm/s LV PW:         1.50 cm  LV E/e' lateral: 11.4 LV IVS:        1.66 cm  LV e' medial:    6.09 cm/s LVOT diam:     2.10 cm  LV E/e' medial:  11.4 LVOT Area:     3.46 cm  LEFT ATRIUM             Index       RIGHT ATRIUM          Index LA diam:        4.00 cm 1.88 cm/m  RA Area:     9.76 cm LA Vol (A2C):   59.1 ml 27.80 ml/m RA Volume:   18.60 ml 8.75 ml/m LA Vol (A4C):    33.7 ml 15.85 ml/m LA Biplane Vol: 48.1 ml 22.62 ml/m   AORTA Ao Root diam: 3.60 cm MITRAL VALVE               TRICUSPID VALVE MV Area (PHT): 3.54 cm    TR Peak grad:   9.9 mmHg MV Decel Time: 214 msec    TR Vmax:        157.00 cm/s MV E velocity: 69.40 cm/s MV A velocity: 67.70 cm/s  SHUNTS MV E/A ratio:  1.03        Systemic Diam: 2.10 cm Oswaldo Milian MD Electronically signed by Oswaldo Milian MD Signature Date/Time: 01/14/2020/3:03:03 PM    Final     Scheduled Meds: . amLODipine  5 mg Oral Daily  . aspirin EC  162 mg Oral Daily  . doxazosin  4 mg  Oral Daily  . heparin  5,000 Units Subcutaneous Q8H  . hydrALAZINE  25 mg Oral BID  . insulin aspart  0-15 Units Subcutaneous TID WC  . insulin aspart  0-5 Units Subcutaneous QHS  . insulin aspart  4 Units Subcutaneous TID WC  . insulin glargine  30 Units Subcutaneous BID  . multivitamin  1 tablet Oral Daily  . pantoprazole  40 mg Oral Daily  . tacrolimus  3 mg Oral BID  . Vitamin D (Ergocalciferol)  50,000 Units Oral Q30 days   Continuous Infusions: . sodium chloride 75 mL/hr at 01/12/20 2030  . sodium chloride 75 mL/hr at 01/12/20 1801  . DAPTOmycin (CUBICIN)  IV 650 mg (01/14/20 2200)     LOS: 3 days    Time spent: 35 minutes    Barton Dubois, MD [facility]   To contact the attending provider between 7A-7P or the covering provider during after hours 7P-7A, please log into the web site [email or web address] and access using universal [place] password for that web site. If you do not have the password, please call the hospital operator.  01/15/2020, 1:38 PM

## 2020-01-15 NOTE — Progress Notes (Signed)
Subjective: Patient started foot soaks yesterday evening. Denies any fever or chills.  Objective: Vital signs in last 24 hours: Temp:  [98 F (36.7 C)-98.9 F (37.2 C)] 98 F (36.7 C) (08/22 0408) Pulse Rate:  [63-76] 71 (08/22 0408) Resp:  [16-20] 16 (08/22 0408) BP: (119-127)/(66-84) 121/66 (08/22 0408) SpO2:  [95 %-98 %] 98 % (08/22 0408) Last BM Date: 01/14/20  Intake/Output from previous day: 08/21 0701 - 08/22 0700 In: 1200 [P.O.:1200] Out: -  Intake/Output this shift: No intake/output data recorded.  General appearance: alert, cooperative and no distress Extremities: Right foot slightly less swollen up to the ankle. No significant drainage expressed from right foot.  Lab Results:  Recent Labs    01/12/20 1328 01/13/20 0722  WBC 6.0 8.4  HGB 14.1 11.3*  HCT 45.2 37.0*  PLT 81* 66*   BMET Recent Labs    01/12/20 1328 01/13/20 0722  NA 128* 136  K 4.7 3.8  CL 96* 109  CO2 21* 18*  GLUCOSE 609* 166*  BUN 55* 60*  CREATININE 2.10* 1.71*  CALCIUM 9.7 8.4*   PT/INR No results for input(s): LABPROT, INR in the last 72 hours.  Studies/Results: ECHOCARDIOGRAM COMPLETE  Result Date: 01/14/2020    ECHOCARDIOGRAM REPORT   Patient Name:   Raymond [name] Date of Exam: 01/14/2020 Medical Rec #:  [id number]       Height:       69.0 in Accession #:    [id number]      Weight:       214.0 lb Date of Birth:  1960/06/01       BSA:          2.126 m Patient Age:    59 years        BP:           131/86 mmHg Patient Gender: M               HR:           76 bpm. Exam Location:  [facility] Procedure: 2D Echo Indications:    Bacteremia 790.7 / R78.81  History:        Patient has no prior history of Echocardiogram examinations.                 Risk Factors:Dyslipidemia, Non-Smoker, Diabetes and                 Hypertension. Acute Kidney Injury, GERD.  Sonographer:    Leavy Cella RDCS (AE) Referring Phys: [place]  1. Left ventricular ejection  fraction, by estimation, is 55 to 60%. The left ventricle has normal function. The left ventricle has no regional wall motion abnormalities. There is moderate left ventricular hypertrophy. Left ventricular diastolic parameters are indeterminate.  2. Right ventricular systolic function is normal. The right ventricular size is normal. There is normal pulmonary artery systolic pressure.  3. The mitral valve is normal in structure. No evidence of mitral valve regurgitation.  4. The aortic valve is tricuspid. Aortic valve regurgitation is not visualized. Mild aortic valve sclerosis is present, with no evidence of aortic valve stenosis.  5. The inferior vena cava is normal in size with greater than 50% respiratory variability, suggesting right atrial pressure of 3 mmHg. Conclusion(s)/Recommendation(s): No vegetation seen. If high clinical suspicion for endocarditis, consider TEE. FINDINGS  Left Ventricle: Left ventricular ejection fraction, by estimation, is 55 to 60%. The left ventricle has normal function. The left ventricle has no  regional wall motion abnormalities. The left ventricular internal cavity size was small. There is moderate  left ventricular hypertrophy. Left ventricular diastolic parameters are indeterminate. Right Ventricle: The right ventricular size is normal. Right vetricular wall thickness was not assessed. Right ventricular systolic function is normal. There is normal pulmonary artery systolic pressure. The tricuspid regurgitant velocity is 1.57 m/s, and with an assumed right atrial pressure of 10 mmHg, the estimated right ventricular systolic pressure is 41.6 mmHg. Left Atrium: Left atrial size was normal in size. Right Atrium: Right atrial size was normal in size. Pericardium: There is no evidence of pericardial effusion. Mitral Valve: The mitral valve is normal in structure. No evidence of mitral valve regurgitation. Tricuspid Valve: The tricuspid valve is normal in structure. Tricuspid valve  regurgitation is trivial. Aortic Valve: The aortic valve is tricuspid. Aortic valve regurgitation is not visualized. Mild aortic valve sclerosis is present, with no evidence of aortic valve stenosis. Pulmonic Valve: The pulmonic valve was not well visualized. Pulmonic valve regurgitation is not visualized. Aorta: The aortic root is normal in size and structure. Venous: The inferior vena cava is normal in size with greater than 50% respiratory variability, suggesting right atrial pressure of 3 mmHg. IAS/Shunts: The interatrial septum was not well visualized.  LEFT VENTRICLE PLAX 2D LVIDd:         2.94 cm  Diastology LVIDs:         2.08 cm  LV e' lateral:   6.09 cm/s LV PW:         1.50 cm  LV E/e' lateral: 11.4 LV IVS:        1.66 cm  LV e' medial:    6.09 cm/s LVOT diam:     2.10 cm  LV E/e' medial:  11.4 LVOT Area:     3.46 cm  LEFT ATRIUM             Index       RIGHT ATRIUM          Index LA diam:        4.00 cm 1.88 cm/m  RA Area:     9.76 cm LA Vol (A2C):   59.1 ml 27.80 ml/m RA Volume:   18.60 ml 8.75 ml/m LA Vol (A4C):   33.7 ml 15.85 ml/m LA Biplane Vol: 48.1 ml 22.62 ml/m   AORTA Ao Root diam: 3.60 cm MITRAL VALVE               TRICUSPID VALVE MV Area (PHT): 3.54 cm    TR Peak grad:   9.9 mmHg MV Decel Time: 214 msec    TR Vmax:        157.00 cm/s MV E velocity: 69.40 cm/s MV A velocity: 67.70 cm/s  SHUNTS MV E/A ratio:  1.03        Systemic Diam: 2.10 cm Oswaldo Milian MD Electronically signed by Oswaldo Milian MD Signature Date/Time: 01/14/2020/3:03:03 PM    Final     Anti-infectives: Anti-infectives (From admission, onward)   Start     Dose/Rate Route Frequency Ordered Stop   01/13/20 2200  DAPTOmycin (CUBICIN) 650 mg in sodium chloride 0.9 % IVPB        650 mg 226 mL/hr over 30 Minutes Intravenous Every 24 hours 01/13/20 2032     01/12/20 2000  DAPTOmycin (CUBICIN) 650 mg in sodium chloride 0.9 % IVPB  Status:  Discontinued        650 mg 226 mL/hr over 30 Minutes  Intravenous Daily  01/12/20 1828 01/13/20 2000   01/12/20 1830  metroNIDAZOLE (FLAGYL) IVPB 500 mg  Status:  Discontinued       "And" Linked Group Details   500 mg 100 mL/hr over 60 Minutes Intravenous Every 8 hours 01/12/20 1743 01/13/20 2000   01/12/20 1815  cefTRIAXone (ROCEPHIN) 2 g in sodium chloride 0.9 % 100 mL IVPB  Status:  Discontinued       "And" Linked Group Details   2 g 200 mL/hr over 30 Minutes Intravenous Every 24 hours 01/12/20 1743 01/13/20 2000   01/12/20 1730  vancomycin (VANCOCIN) IVPB 1000 mg/200 mL premix        1,000 mg 200 mL/hr over 60 Minutes Intravenous  Once 01/12/20 1719 01/12/20 1856      Assessment/Plan: Impression: Cellulitis with abscess, right foot, slowly resolving. Plan: As there is improvement in the right foot, acute surgical intervention is not needed as the abscess is draining. I agree that more definitive foot surgery will be needed and this can be performed by Dr. Jacqualyn Posey of podiatry in [place]. Echo negative. Awaiting follow-up white blood cell count. Continue current wound care.  LOS: 3 days    Aviva Signs 01/15/2020

## 2020-01-15 NOTE — Progress Notes (Signed)
Glucometer did not transfer over CBG values:  0800  150 1200  166 1700   196

## 2020-01-15 NOTE — Progress Notes (Signed)
Antibiotics:  Anti-infectives (From admission, onward)   Start     Dose/Rate Route Frequency Ordered Stop   01/13/20 2200  DAPTOmycin (CUBICIN) 650 mg in sodium chloride 0.9 % IVPB        650 mg 226 mL/hr over 30 Minutes Intravenous Every 24 hours 01/13/20 2032     01/12/20 2000  DAPTOmycin (CUBICIN) 650 mg in sodium chloride 0.9 % IVPB  Status:  Discontinued        650 mg 226 mL/hr over 30 Minutes Intravenous Daily 01/12/20 1828 01/13/20 2000   01/12/20 1830  metroNIDAZOLE (FLAGYL) IVPB 500 mg  Status:  Discontinued       "And" Linked Group Details   500 mg 100 mL/hr over 60 Minutes Intravenous Every 8 hours 01/12/20 1743 01/13/20 2000   01/12/20 1815  cefTRIAXone (ROCEPHIN) 2 g in sodium chloride 0.9 % 100 mL IVPB  Status:  Discontinued       "And" Linked Group Details   2 g 200 mL/hr over 30 Minutes Intravenous Every 24 hours 01/12/20 1743 01/13/20 2000   01/12/20 1730  vancomycin (VANCOCIN) IVPB 1000 mg/200 mL premix        1,000 mg 200 mL/hr over 60 Minutes Intravenous  Once 01/12/20 1719 01/12/20 1856      Medications: Scheduled Meds: . amLODipine  5 mg Oral Daily  . aspirin EC  162 mg Oral Daily  . doxazosin  4 mg Oral Daily  . heparin  5,000 Units Subcutaneous Q8H  . hydrALAZINE  25 mg Oral BID  . insulin aspart  0-15 Units Subcutaneous TID WC  . insulin aspart  0-5 Units Subcutaneous QHS  . insulin aspart  4 Units Subcutaneous TID WC  . insulin glargine  30 Units Subcutaneous BID  . multivitamin  1 tablet Oral Daily  . pantoprazole  40 mg Oral Daily  . tacrolimus  3 mg Oral BID  . Vitamin D (Ergocalciferol)  50,000 Units Oral Q30 days   Continuous Infusions: . sodium chloride 75 mL/hr at 01/12/20 2030  . sodium chloride 75 mL/hr at 01/12/20 1801  . DAPTOmycin (CUBICIN)  IV 650 mg (01/14/20 2200)   PRN Meds:.    Objective: Weight change:   Intake/Output Summary (Last 24 hours) at 01/15/2020 1520 Last data filed at 01/15/2020 0900 Gross per  24 hour  Intake 720 ml  Output --  Net 720 ml   Blood pressure 135/83, pulse 73, temperature 98.2 F (36.8 C), temperature source Oral, resp. rate 16, height 5\' 9"  (1.753 m), weight 97.1 kg, SpO2 99 %. Temp:  [98 F (36.7 C)-98.7 F (37.1 C)] 98.2 F (36.8 C) (08/22 1421) Pulse Rate:  [63-73] 73 (08/22 1421) Resp:  [16] 16 (08/22 1421) BP: (119-135)/(66-83) 135/83 (08/22 1421) SpO2:  [95 %-99 %] 99 % (08/22 1421)  Physical Exam:   CBC:    BMET Recent Labs    01/13/20 0722  NA 136  K 3.8  CL 109  CO2 18*  GLUCOSE 166*  BUN 60*  CREATININE 1.71*  CALCIUM 8.4*     Liver Panel  Recent Labs    01/13/20 0722  PROT 6.1*  ALBUMIN 2.5*  AST 15  ALT 17  ALKPHOS 97  BILITOT 0.8       Sedimentation Rate No results for input(s): ESRSEDRATE in the last 72 hours. C-Reactive Protein No results for input(s): CRP in the last 72 hours.  Micro Results: Recent Results (from the past  720 hour(s))  SARS Coronavirus 2 by RT PCR (hospital order, performed in [facility] hospital lab) Nasopharyngeal Nasopharyngeal Swab     Status: None   Collection Time: 01/12/20  4:06 PM   Specimen: Nasopharyngeal Swab  Result Value Ref Range Status   SARS Coronavirus 2 NEGATIVE NEGATIVE Final    Comment: (NOTE) SARS-CoV-2 target nucleic acids are NOT DETECTED.  The SARS-CoV-2 RNA is generally detectable in upper and lower respiratory specimens during the acute phase of infection. The lowest concentration of SARS-CoV-2 viral copies this assay can detect is 250 copies / mL. A negative result does not preclude SARS-CoV-2 infection and should not be used as the sole basis for treatment or other patient management decisions.  A negative result may occur with improper specimen collection / handling, submission of specimen other than nasopharyngeal swab, presence of viral mutation(s) within the areas targeted by this assay, and inadequate number of viral copies (<250 copies / mL). A  negative result must be combined with clinical observations, patient history, and epidemiological information.  Fact Sheet for Patients:   StrictlyIdeas.no  Fact Sheet for Healthcare Providers: BankingDealers.co.za  This test is not yet approved or  cleared by the [place] FDA and has been authorized for detection and/or diagnosis of SARS-CoV-2 by FDA under an Emergency Use Authorization (EUA).  This EUA will remain in effect (meaning this test can be used) for the duration of the COVID-19 declaration under Section 564(b)(1) of the Act, 21 U.S.C. section 360bbb-3(b)(1), unless the authorization is terminated or revoked sooner.  Performed at [facility], [address], [place], [place] [address]   Culture, blood (routine x 2)     Status: Abnormal   Collection Time: 01/12/20  6:16 PM   Specimen: BLOOD RIGHT HAND  Result Value Ref Range Status   Specimen Description   Final    BLOOD RIGHT HAND Performed at [facility], [address] [place], [place] [address]    Special Requests   Final    BOTTLES DRAWN AEROBIC AND ANAEROBIC Blood Culture adequate volume Performed at [facility] [place], [place] [address]    Culture  Setup Time   Final    ANAEROBIC and AEROBIC BOTTLE GRAM POSITIVE COCCI Gram Stain Report Called to,Read Back By and Verified With: DEAN,T@1103  BY MATTHEWS, B 8.20.2021 [facility] Organism ID to follow CRITICAL RESULT CALLED TO, READ BACK BY AND VERIFIED WITH: A,MUHAMMAD RN @1947  01/13/20 EB    Culture (A)  Final    METHICILLIN RESISTANT STAPHYLOCOCCUS AUREUS ENTEROCOCCUS FAECALIS CRITICAL RESULT CALLED TO, READ BACK BY AND VERIFIED WITH: Tally Due [id number] FCP Performed at [facility], [place] [address] [place], [place] [address]    Report Status 01/15/2020 FINAL  Final   Organism ID, Bacteria METHICILLIN RESISTANT STAPHYLOCOCCUS AUREUS  Final   Organism ID, Bacteria  ENTEROCOCCUS FAECALIS  Final      Susceptibility   Enterococcus faecalis - MIC*    AMPICILLIN <=2 SENSITIVE Sensitive     VANCOMYCIN 1 SENSITIVE Sensitive     GENTAMICIN SYNERGY SENSITIVE Sensitive     * ENTEROCOCCUS FAECALIS   Methicillin resistant staphylococcus aureus - MIC*    CIPROFLOXACIN >=8 RESISTANT Resistant     ERYTHROMYCIN >=8 RESISTANT Resistant     GENTAMICIN <=0.5 SENSITIVE Sensitive     OXACILLIN >=4 RESISTANT Resistant     TETRACYCLINE >=16 RESISTANT Resistant     VANCOMYCIN <=0.5 SENSITIVE Sensitive     TRIMETH/SULFA <=10  SENSITIVE Sensitive     CLINDAMYCIN <=0.25 SENSITIVE Sensitive     RIFAMPIN <=0.5 SENSITIVE Sensitive     Inducible Clindamycin NEGATIVE Sensitive     * METHICILLIN RESISTANT STAPHYLOCOCCUS AUREUS  Culture, blood (routine x 2)     Status: Abnormal   Collection Time: 01/12/20  6:16 PM   Specimen: BLOOD RIGHT HAND  Result Value Ref Range Status   Specimen Description   Final    BLOOD RIGHT HAND Performed at [facility], [address], [place], [place] [address]    Special Requests   Final    BOTTLES DRAWN AEROBIC AND ANAEROBIC Blood Culture adequate volume Performed at [name], [address], [place], [place] [address]    Culture  Setup Time   Final    ANAEROBIC AND AEROBIC BOTTLES GRAM POSITIVE COCCI Gram Stain Report Called to,Read Back By and Verified With: DEAN,T@1103  BY MATTHEWS, B 8.20.21 [place] CRITICAL VALUE NOTED.  VALUE IS CONSISTENT WITH PREVIOUSLY REPORTED AND CALLED VALUE.    Culture (A)  Final    STAPHYLOCOCCUS AUREUS ENTEROCOCCUS FAECALIS SUSCEPTIBILITIES PERFORMED ON PREVIOUS CULTURE WITHIN THE LAST 5 DAYS. Performed at [facility], [place] [address] [place], [place] [address]    Report Status 01/15/2020 FINAL  Final  Blood Culture ID Panel (Reflexed)     Status: Abnormal   Collection Time: 01/12/20  6:16 PM  Result Value Ref Range Status   Enterococcus faecalis NOT DETECTED NOT DETECTED Final     Enterococcus Faecium NOT DETECTED NOT DETECTED Final   Listeria monocytogenes NOT DETECTED NOT DETECTED Final   Staphylococcus species DETECTED (A) NOT DETECTED Final    Comment: RESULT CALLED TO, READ BACK BY AND VERIFIED WITH: A,MUHAMMAD RN @1947  01/13/20 EB    Staphylococcus aureus (BCID) DETECTED (A) NOT DETECTED Final    Comment: Methicillin (oxacillin)-resistant Staphylococcus aureus (MRSA). MRSA is predictably resistant to beta-lactam antibiotics (except ceftaroline). Preferred therapy is vancomycin unless clinically contraindicated. Patient requires contact precautions if  hospitalized. RESULT CALLED TO, READ BACK BY AND VERIFIED WITH: A,MUHAMMAD RN @1947  01/13/20 EB    Staphylococcus epidermidis NOT DETECTED NOT DETECTED Final   Staphylococcus lugdunensis NOT DETECTED NOT DETECTED Final   Streptococcus species NOT DETECTED NOT DETECTED Final   Streptococcus agalactiae NOT DETECTED NOT DETECTED Final   Streptococcus pneumoniae NOT DETECTED NOT DETECTED Final   Streptococcus pyogenes NOT DETECTED NOT DETECTED Final   A.calcoaceticus-baumannii NOT DETECTED NOT DETECTED Final   Bacteroides fragilis NOT DETECTED NOT DETECTED Final   Enterobacterales NOT DETECTED NOT DETECTED Final   Enterobacter cloacae complex NOT DETECTED NOT DETECTED Final   Escherichia coli NOT DETECTED NOT DETECTED Final   Klebsiella aerogenes NOT DETECTED NOT DETECTED Final   Klebsiella oxytoca NOT DETECTED NOT DETECTED Final   Klebsiella pneumoniae NOT DETECTED NOT DETECTED Final   Proteus species NOT DETECTED NOT DETECTED Final   Salmonella species NOT DETECTED NOT DETECTED Final   Serratia marcescens NOT DETECTED NOT DETECTED Final   Haemophilus influenzae NOT DETECTED NOT DETECTED Final   Neisseria meningitidis NOT DETECTED NOT DETECTED Final   Pseudomonas aeruginosa NOT DETECTED NOT DETECTED Final   Stenotrophomonas maltophilia NOT DETECTED NOT DETECTED Final   Candida albicans NOT DETECTED NOT  DETECTED Final   Candida auris NOT DETECTED NOT DETECTED Final   Candida glabrata NOT DETECTED NOT DETECTED Final   Candida krusei NOT DETECTED NOT DETECTED Final   Candida parapsilosis NOT DETECTED NOT DETECTED Final   Candida tropicalis NOT DETECTED NOT  DETECTED Final   Cryptococcus neoformans/gattii NOT DETECTED NOT DETECTED Final   Meth resistant mecA/C and MREJ DETECTED (A) NOT DETECTED Final    Comment: RESULT CALLED TO, READ BACK BY AND VERIFIED WITH: A,MUHAMMAD RN @1947  01/13/20 EB Performed at [facility] [address] [place], [place] [address]   Culture, blood (Routine X 2) w Reflex to ID Panel     Status: None (Preliminary result)   Collection Time: 01/13/20  9:04 PM   Specimen: BLOOD RIGHT HAND  Result Value Ref Range Status   Specimen Description BLOOD RIGHT HAND  Final   Special Requests   Final    BOTTLES DRAWN AEROBIC AND ANAEROBIC Blood Culture adequate volume   Culture   Final    NO GROWTH 2 DAYS Performed at [facility], [address] [place], [place] [address]    Report Status PENDING  Incomplete  Culture, blood (Routine X 2) w Reflex to ID Panel     Status: None (Preliminary result)   Collection Time: 01/13/20  9:04 PM   Specimen: BLOOD RIGHT HAND  Result Value Ref Range Status   Specimen Description BLOOD RIGHT HAND  Final   Special Requests   Final    BOTTLES DRAWN AEROBIC AND ANAEROBIC Blood Culture adequate volume   Culture   Final    NO GROWTH 2 DAYS Performed at [facility], [address], [place], [place] [address]    Report Status PENDING  Incomplete  Culture, blood (Routine X 2) w Reflex to ID Panel     Status: None (Preliminary result)   Collection Time: 01/15/20  1:46 PM   Specimen: BLOOD RIGHT HAND  Result Value Ref Range Status   Specimen Description   Final    BLOOD RIGHT HAND BOTTLES DRAWN AEROBIC AND ANAEROBIC   Special Requests   Final    Blood Culture adequate volume Performed at [facility], [address],  [place], [place] [address]    Culture PENDING  Incomplete   Report Status PENDING  Incomplete  Culture, blood (Routine X 2) w Reflex to ID Panel     Status: None (Preliminary result)   Collection Time: 01/15/20  1:46 PM   Specimen: Right Antecubital; Blood  Result Value Ref Range Status   Specimen Description   Final    RIGHT ANTECUBITAL BOTTLES DRAWN AEROBIC AND ANAEROBIC   Special Requests   Final    Blood Culture adequate volume Performed at [facility], [address] [place], [place] [address]    Culture PENDING  Incomplete   Report Status PENDING  Incomplete    Studies/Results: ECHOCARDIOGRAM COMPLETE  Result Date: 01/14/2020    ECHOCARDIOGRAM REPORT   Patient Name:   STONY STEGMANN Date of Exam: 01/14/2020 Medical Rec #:  [id number]       Height:       69.0 in Accession #:    [id number]      Weight:       214.0 lb Date of Birth:  Nov 21, 1960       BSA:          2.126 m Patient Age:    75 years        BP:           131/86 mmHg Patient Gender: M               HR:           76 bpm. Exam Location:  [facility] Procedure: 2D Echo  Indications:    Bacteremia 790.7 / R78.81  History:        Patient has no prior history of Echocardiogram examinations.                 Risk Factors:Dyslipidemia, Non-Smoker, Diabetes and                 Hypertension. Acute Kidney Injury, GERD.  Sonographer:    Leavy Cella RDCS (AE) Referring Phys: [place]  1. Left ventricular ejection fraction, by estimation, is 55 to 60%. The left ventricle has normal function. The left ventricle has no regional wall motion abnormalities. There is moderate left ventricular hypertrophy. Left ventricular diastolic parameters are indeterminate.  2. Right ventricular systolic function is normal. The right ventricular size is normal. There is normal pulmonary artery systolic pressure.  3. The mitral valve is normal in structure. No evidence of mitral valve regurgitation.  4. The aortic valve is tricuspid. Aortic  valve regurgitation is not visualized. Mild aortic valve sclerosis is present, with no evidence of aortic valve stenosis.  5. The inferior vena cava is normal in size with greater than 50% respiratory variability, suggesting right atrial pressure of 3 mmHg. Conclusion(s)/Recommendation(s): No vegetation seen. If high clinical suspicion for endocarditis, consider TEE. FINDINGS  Left Ventricle: Left ventricular ejection fraction, by estimation, is 55 to 60%. The left ventricle has normal function. The left ventricle has no regional wall motion abnormalities. The left ventricular internal cavity size was small. There is moderate  left ventricular hypertrophy. Left ventricular diastolic parameters are indeterminate. Right Ventricle: The right ventricular size is normal. Right vetricular wall thickness was not assessed. Right ventricular systolic function is normal. There is normal pulmonary artery systolic pressure. The tricuspid regurgitant velocity is 1.57 m/s, and with an assumed right atrial pressure of 10 mmHg, the estimated right ventricular systolic pressure is 83.3 mmHg. Left Atrium: Left atrial size was normal in size. Right Atrium: Right atrial size was normal in size. Pericardium: There is no evidence of pericardial effusion. Mitral Valve: The mitral valve is normal in structure. No evidence of mitral valve regurgitation. Tricuspid Valve: The tricuspid valve is normal in structure. Tricuspid valve regurgitation is trivial. Aortic Valve: The aortic valve is tricuspid. Aortic valve regurgitation is not visualized. Mild aortic valve sclerosis is present, with no evidence of aortic valve stenosis. Pulmonic Valve: The pulmonic valve was not well visualized. Pulmonic valve regurgitation is not visualized. Aorta: The aortic root is normal in size and structure. Venous: The inferior vena cava is normal in size with greater than 50% respiratory variability, suggesting right atrial pressure of 3 mmHg. IAS/Shunts: The  interatrial septum was not well visualized.  LEFT VENTRICLE PLAX 2D LVIDd:         2.94 cm  Diastology LVIDs:         2.08 cm  LV e' lateral:   6.09 cm/s LV PW:         1.50 cm  LV E/e' lateral: 11.4 LV IVS:        1.66 cm  LV e' medial:    6.09 cm/s LVOT diam:     2.10 cm  LV E/e' medial:  11.4 LVOT Area:     3.46 cm  LEFT ATRIUM             Index       RIGHT ATRIUM          Index LA diam:  4.00 cm 1.88 cm/m  RA Area:     9.76 cm LA Vol (A2C):   59.1 ml 27.80 ml/m RA Volume:   18.60 ml 8.75 ml/m LA Vol (A4C):   33.7 ml 15.85 ml/m LA Biplane Vol: 48.1 ml 22.62 ml/m   AORTA Ao Root diam: 3.60 cm MITRAL VALVE               TRICUSPID VALVE MV Area (PHT): 3.54 cm    TR Peak grad:   9.9 mmHg MV Decel Time: 214 msec    TR Vmax:        157.00 cm/s MV E velocity: 69.40 cm/s MV A velocity: 67.70 cm/s  SHUNTS MV E/A ratio:  1.03        Systemic Diam: 2.10 cm Oswaldo Milian MD Electronically signed by Oswaldo Milian MD Signature Date/Time: 01/14/2020/3:03:03 PM    Final       Assessment/Plan:  INTERVAL HISTORY: TTE unrevealing   Active Problems:   Acute kidney injury ([place])   Immunosuppression ([place])   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 male with   59 y.o. 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 MRI did not show clear osteomyelitis and he has been seen by general surgery. His blood cultures have been + for MRSA and he is on  daptomycin TTE negative  #1 MRSA bactereamia  --continue dapto --ensure blood clearing --obtain TEE --monitor foot closely    LOS: 3 days   Alcide Evener 01/15/2020, 3:20 PM

## 2020-01-16 LAB — GLUCOSE, CAPILLARY
Glucose-Capillary: 209 mg/dL — ABNORMAL HIGH (ref 70–99)
Glucose-Capillary: 166 mg/dL — ABNORMAL HIGH (ref 70–99)
Glucose-Capillary: 199 mg/dL — ABNORMAL HIGH (ref 70–99)
Glucose-Capillary: 143 mg/dL — ABNORMAL HIGH (ref 70–99)

## 2020-01-16 MED ORDER — POVIDONE-IODINE 10 % EX SOLN
CUTANEOUS | Status: AC
  Filled 2020-01-16: qty 15

## 2020-01-16 MED ORDER — OXYCODONE-ACETAMINOPHEN 5-325 MG PO TABS
1.0000 | ORAL_TABLET | ORAL | Status: DC | PRN
  Administered 2020-01-16 – 2020-01-20 (×12): 1 via ORAL
  Filled 2020-01-16 (×13): qty 1

## 2020-01-16 NOTE — Consult Note (Signed)
PV Navigator Consult acknowledged and chart reviewed. PV Navigator was on PAL at time of referral.  59 y/o male admitted 01/12/20 for worsening right plantar foot wound. + MRSA bacteremia. Wound had been followed by Podiatry outpatient. MRI R foot suggested developing abscess and unable to exclude osteomyelitis, however ortho not recommending surgical intervention at this time due to wound is draining. Recommendation is for continued local wound care and patient to follow up with podiatry for potential surgical implication at a later time. Patient is followed by infectious disease and continues on IV antibiotics.   PMH to include: CKD/ESRD, S/P renal transplant 2017, DM, HTN.   Never smoker.   01/13/20 VAS [place] ABI R= 1.04   L=1.09    01/13/20 Arterial Duplex indicates ABI's sustained however right side with monophasic waveforms at the ankle and left side with monophasic waveforms at DP. I may would suggest vascular consult to assess/follow further in addition to Podiatry.   Other disciplines following include Nutrition to optimize wound healing, Diabetes Coordinator for glycemic control and TOC team.   No other barriers identified at this time. Will plan to follow this patient remotely.  Thank you, Cletis Media RN BSN CWS [place]

## 2020-01-16 NOTE — Progress Notes (Signed)
PROGRESS NOTE    Raymond [name]  ZRA:[id number] DOB: [birth year] DOA: 01/12/2020 PCP: [name], FNP    Chief Complaint  Patient presents with  .  Right diabetic foot ulcer.    Brief Narrative:  As per H&P written by Dr. Waldron Labs on 01/12/2020  Raymond [name]  is a 59 y.o. 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, it was sent by podiatry for evaluation for wound in the right plantar foot, it was seen today by podiatry, and he had a concern of worsening foot infection, so he was sent to ED for further evaluation, patient reports symptoms has been going on for last 2 weeks, and currently he is with purulent foul-smelling odor, as well there was a concern for possible DVT in his right lower extremity given swelling, patient was at [facility] ED yesterday, and he left prior to getting his imaging study done, labs during that visit showing elevated D-dimers greater than 19, patient himself denies any chest pain, palpitation, dizziness, he does report mild dyspnea, report he has been compliant with his Lantus and glipizide. - in ED work-up significant for pseudohyponatremia sodium of 128, creatinine of 2.1 from baseline 1.3, glucose of 609, but normal anion gap, and bicarb of 21, had low platelet at 81, negative, he is unvaccinated, started on vancomycin, [facility] hospitalist consulted to admit.   Assessment & Plan: 1-right diabetic foot ulcer/MRSA bacteremia: Plantar aspect  -Open wound with purulent discharge -Present for the last 2 weeks and worsening -No tenderness secondary to neuropathy -High risk for becoming diffusely infected as patient has underlying history immunosuppressant due to renal transplant status.   -Continue current antibiotics, on Daptomycin. -ID has recommended to check TEE; discussed with cardiology service and will be done tomorrow  01/17/20. -MRI suggesting developing abscess and osteonecrosis; general surgery has been consulted -Will follow recommendations. -Continue wound care and soaking bath with Bethadine, no surgical intervention currently.   2-Acute kidney injury on chronic kidney disease; patient with prior history of renal transplant -has chronically Remained in a stage IIIa renal failure. -Continue minimizing nephrotoxic agents -Follow renal function trend -Gentle fluid resuscitation provided. -Continue Prograf. -Cr at baseline now, will monitor trend.  3-Immunosuppression ([place]) -Continue Prograf  -History of renal transplant.  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 -Follow CBGs -Modified carbohydrate diet has been encouraged. -Close follow-up after discharge with patient's endocrinologist has been recommended.  5-Essential hypertension, benign -Stable overall -Continue current antihypertensive regimen. -Follow vital signs.  6-Class 1 obesity due to excess calories with serious comorbidity and body mass index (BMI) of 31.0 to 31.9 in adult -Low calorie diet, portion control and increase physical activity discussed with patient -Body mass index is 31.6 kg/m.  7-elevated D-dimer/right lower extremity superficial thrombophlebitis -No DVT and SVT not close to femoral vein, for that no anticoagulation required -VQ scan also negative for concerns of pulmonary embolism. -Continue supportive care, TED hoses usage and 162mg  of Aspirin.  -continue PRN analgesics.    DVT prophylaxis: Heparin Code Status: Full code Family Communication: No family at bedside. Disposition:   Status is: Inpatient  Dispo: The patient is from: Home              Anticipated d/c is to: Home  Anticipated d/c date is: 2-3 days              Patient currently no medically stable for discharge;  right foot diabetic ulcer open and actively draining with positive MRSA blood cultures.  Continue current use of IV daptomycin and follow  TEE results, TEE tomorrow. Will repeat blood cx's. Some mild osteonecrosis appreciated but no frank osteomyelitis.  Appreciate assistance by general surgery and infectious disease services.  Continue wound care and current IV antibiotics.    Consultants:   General surgery  Infectious disease (Dr. Tommy Medal) recommendations given to use daptomycin and to check TEE.  Cardiology service: Consulted for TEE on Monday   Procedures:  See below for x-ray report   Antimicrobials:  Daptomycin 01/12/20 ceftriaxone and Flagyl 01/12/20>>>01/14/20   Subjective: No fever, no chest pain, no nausea, no vomiting, no shortness of breath.  Right plantar wound continue demonstrating improvement in drainage and appearance.   Objective: Vitals:   01/15/20 0408 01/15/20 1421 01/15/20 2010 01/16/20 0430  BP: 121/66 135/83 122/60 (!) 170/94  Pulse: 71 73 67 68  Resp: 16 16 20 18   Temp: 98 F (36.7 C) 98.2 F (36.8 C) 98.6 F (37 C) 99.1 F (37.3 C)  TempSrc: Oral Oral Oral   SpO2: 98% 99% 98% 99%  Weight:      Height:        Intake/Output Summary (Last 24 hours) at 01/16/2020 1233 Last data filed at 01/16/2020 1275 Gross per 24 hour  Intake 1535.8 ml  Output --  Net 1535.8 ml   Filed Weights   01/12/20 1253  Weight: 97.1 kg    Examination: General exam: Alert, awake, oriented x 3; still complaining of pain in his right lower extremity when you his BP has been diagnosed.  Improvement in drainage from his right plantar diabetic foot ulcer and no signs of superimposed erythematous changes.  Reported that due to neuropathy he is not having any pain in his foot. Respiratory system: Clear to auscultation. Respiratory effort normal. Cardiovascular system:RRR. No murmurs, rubs, gallops.  No JVD Gastrointestinal system: Abdomen is nondistended, soft and nontender.  No organomegaly or masses felt. Normal bowel sounds heard. Central nervous system: Alert and oriented. No focal neurological deficits. Extremities: No cyanosis or clubbing; trace to 1+ lower extremity edema on his right leg appreciated. Skin: No rashes, no petechiae; no superimposed erythema appreciated on his plantar wound, improvement in drainage appreciated, no foul smelling appreciated currently. Psychiatry: Judgement and insight appear normal. Mood & affect appropriate.    Data Reviewed: I have personally reviewed following labs and imaging studies  CBC: Recent Labs  Lab 01/12/20 1328 01/13/20 0722  WBC 6.0 8.4  NEUTROABS 5.0  --   HGB 14.1 11.3*  HCT 45.2 37.0*  MCV 82.8 83.5  PLT 81* 66*    Basic Metabolic Panel: Recent Labs  Lab 01/12/20 1328 01/13/20 0722  NA 128* 136  K 4.7 3.8  CL 96* 109  CO2 21* 18*  GLUCOSE 609* 166*  BUN 55* 60*  CREATININE 2.10* 1.71*  CALCIUM 9.7 8.4*    GFR: Estimated Creatinine Clearance: 53.5 mL/min (A) (by C-G formula based on SCr of 1.71 mg/dL (H)).  Liver Function Tests: Recent Labs  Lab 01/13/20 0722  AST 15  ALT 17  ALKPHOS 97  BILITOT 0.8  PROT 6.1*  ALBUMIN 2.5*    CBG: Recent Labs  Lab 01/15/20 1121 01/15/20 1617 01/15/20 2032 01/16/20 0755 01/16/20 1123  GLUCAP 166* 196* 120*  199* 143*     Recent Results (from the past 240 hour(s))  WOUND CULTURE     Status: Abnormal (Preliminary result)   Collection Time: 01/12/20  2:12 PM  Result Value Ref Range Status   MICRO NUMBER: [id number]  Preliminary   SPECIMEN QUALITY: Adequate  Preliminary   SOURCE: NOT GIVEN  Preliminary   STATUS: PRELIMINARY  Preliminary   GRAM STAIN:   Preliminary    Rare White blood cells seen No epithelial cells seen Many Gram positive cocci in pairs Few Gram positive bacilli   ISOLATE 1: Staphylococcus aureus (A)  Preliminary    Comment: Heavy growth of Staphylococcus aureus , susceptibility test report to follow.  SARS  Coronavirus 2 by RT PCR (hospital order, performed in [facility] hospital lab) Nasopharyngeal Nasopharyngeal Swab     Status: None   Collection Time: 01/12/20  4:06 PM   Specimen: Nasopharyngeal Swab  Result Value Ref Range Status   SARS Coronavirus 2 NEGATIVE NEGATIVE Final    Comment: (NOTE) SARS-CoV-2 target nucleic acids are NOT DETECTED.  The SARS-CoV-2 RNA is generally detectable in upper and lower respiratory specimens during the acute phase of infection. The lowest concentration of SARS-CoV-2 viral copies this assay can detect is 250 copies / mL. A negative result does not preclude SARS-CoV-2 infection and should not be used as the sole basis for treatment or other patient management decisions.  A negative result may occur with improper specimen collection / handling, submission of specimen other than nasopharyngeal swab, presence of viral mutation(s) within the areas targeted by this assay, and inadequate number of viral copies (<250 copies / mL). A negative result must be combined with clinical observations, patient history, and epidemiological information.  Fact Sheet for Patients:   StrictlyIdeas.no  Fact Sheet for Healthcare Providers: BankingDealers.co.za  This test is not yet approved or  cleared by the [place] FDA and has been authorized for detection and/or diagnosis of SARS-CoV-2 by FDA under an Emergency Use Authorization (EUA).  This EUA will remain in effect (meaning this test can be used) for the duration of the COVID-19 declaration under Section 564(b)(1) of the Act, 21 U.S.C. section 360bbb-3(b)(1), unless the authorization is terminated or revoked sooner.  Performed at [facility], [address] [place], [place] [address]   Culture, blood (routine x 2)     Status: Abnormal   Collection Time: 01/12/20  6:16 PM   Specimen: BLOOD RIGHT HAND  Result Value Ref Range Status   Specimen Description   Final     BLOOD RIGHT HAND Performed at [facility], [address] [place], [place] [address]    Special Requests   Final    BOTTLES DRAWN AEROBIC AND ANAEROBIC Blood Culture adequate volume Performed at [address] [place], [place] [address]    Culture  Setup Time   Final    ANAEROBIC and AEROBIC BOTTLE GRAM POSITIVE COCCI Gram Stain Report Called to,Read Back By and Verified With: DEAN,T@1103  BY MATTHEWS, B 8.20.2021 [facility] Organism ID to follow CRITICAL RESULT CALLED TO, READ BACK BY AND VERIFIED WITH: A,MUHAMMAD RN @1947  01/13/20 EB    Culture (A)  Final    METHICILLIN RESISTANT STAPHYLOCOCCUS AUREUS ENTEROCOCCUS FAECALIS CRITICAL RESULT CALLED TO, READ BACK BY AND VERIFIED WITH: Tally Due [id number] FCP Performed at [facility], [place] [address], [place], [place] [address]    Report Status 01/15/2020 FINAL  Final   Organism ID, Bacteria METHICILLIN RESISTANT  STAPHYLOCOCCUS AUREUS  Final   Organism ID, Bacteria ENTEROCOCCUS FAECALIS  Final      Susceptibility   Enterococcus faecalis - MIC*    AMPICILLIN <=2 SENSITIVE Sensitive     VANCOMYCIN 1 SENSITIVE Sensitive     GENTAMICIN SYNERGY SENSITIVE Sensitive     * ENTEROCOCCUS FAECALIS   Methicillin resistant staphylococcus aureus - MIC*    CIPROFLOXACIN >=8 RESISTANT Resistant     ERYTHROMYCIN >=8 RESISTANT Resistant     GENTAMICIN <=0.5 SENSITIVE Sensitive     OXACILLIN >=4 RESISTANT Resistant     TETRACYCLINE >=16 RESISTANT Resistant     VANCOMYCIN <=0.5 SENSITIVE Sensitive     TRIMETH/SULFA <=10 SENSITIVE Sensitive     CLINDAMYCIN <=0.25 SENSITIVE Sensitive     RIFAMPIN <=0.5 SENSITIVE Sensitive     Inducible Clindamycin NEGATIVE Sensitive     * METHICILLIN RESISTANT STAPHYLOCOCCUS AUREUS  Culture, blood (routine x 2)     Status: Abnormal   Collection Time: 01/12/20  6:16 PM   Specimen: BLOOD RIGHT HAND  Result Value Ref Range Status   Specimen Description   Final    BLOOD RIGHT  HAND Performed at [facility], [address] [place], [place] [address]    Special Requests   Final    BOTTLES DRAWN AEROBIC AND ANAEROBIC Blood Culture adequate volume Performed at [facility], [address] [place], [place] [address]    Culture  Setup Time   Final    ANAEROBIC AND AEROBIC BOTTLES GRAM POSITIVE COCCI Gram Stain Report Called to,Read Back By and Verified With: DEAN,T@1103  BY MATTHEWS, B 8.20.21 [facility] CRITICAL VALUE NOTED.  VALUE IS CONSISTENT WITH PREVIOUSLY REPORTED AND CALLED VALUE.    Culture (A)  Final    STAPHYLOCOCCUS AUREUS ENTEROCOCCUS FAECALIS SUSCEPTIBILITIES PERFORMED ON PREVIOUS CULTURE WITHIN THE LAST 5 DAYS. Performed at [facility], [place] [address] [place], [place] [address]    Report Status 01/15/2020 FINAL  Final  Blood Culture ID Panel (Reflexed)     Status: Abnormal   Collection Time: 01/12/20  6:16 PM  Result Value Ref Range Status   Enterococcus faecalis NOT DETECTED NOT DETECTED Final   Enterococcus Faecium NOT DETECTED NOT DETECTED Final   Listeria monocytogenes NOT DETECTED NOT DETECTED Final   Staphylococcus species DETECTED (A) NOT DETECTED Final    Comment: RESULT CALLED TO, READ BACK BY AND VERIFIED WITH: A,MUHAMMAD RN @1947  01/13/20 EB    Staphylococcus aureus (BCID) DETECTED (A) NOT DETECTED Final    Comment: Methicillin (oxacillin)-resistant Staphylococcus aureus (MRSA). MRSA is predictably resistant to beta-lactam antibiotics (except ceftaroline). Preferred therapy is vancomycin unless clinically contraindicated. Patient requires contact precautions if  hospitalized. RESULT CALLED TO, READ BACK BY AND VERIFIED WITH: A,MUHAMMAD RN @1947  01/13/20 EB    Staphylococcus epidermidis NOT DETECTED NOT DETECTED Final   Staphylococcus lugdunensis NOT DETECTED NOT DETECTED Final   Streptococcus species NOT DETECTED NOT DETECTED Final   Streptococcus agalactiae NOT DETECTED NOT DETECTED Final   Streptococcus  pneumoniae NOT DETECTED NOT DETECTED Final   Streptococcus pyogenes NOT DETECTED NOT DETECTED Final   A.calcoaceticus-baumannii NOT DETECTED NOT DETECTED Final   Bacteroides fragilis NOT DETECTED NOT DETECTED Final   Enterobacterales NOT DETECTED NOT DETECTED Final   Enterobacter cloacae complex NOT DETECTED NOT DETECTED Final   Escherichia coli NOT DETECTED NOT DETECTED Final   Klebsiella aerogenes NOT DETECTED NOT DETECTED Final   Klebsiella oxytoca NOT DETECTED NOT DETECTED Final   Klebsiella pneumoniae NOT DETECTED NOT DETECTED Final  Proteus species NOT DETECTED NOT DETECTED Final   Salmonella species NOT DETECTED NOT DETECTED Final   Serratia marcescens NOT DETECTED NOT DETECTED Final   Haemophilus influenzae NOT DETECTED NOT DETECTED Final   Neisseria meningitidis NOT DETECTED NOT DETECTED Final   Pseudomonas aeruginosa NOT DETECTED NOT DETECTED Final   Stenotrophomonas maltophilia NOT DETECTED NOT DETECTED Final   Candida albicans NOT DETECTED NOT DETECTED Final   Candida auris NOT DETECTED NOT DETECTED Final   Candida glabrata NOT DETECTED NOT DETECTED Final   Candida krusei NOT DETECTED NOT DETECTED Final   Candida parapsilosis NOT DETECTED NOT DETECTED Final   Candida tropicalis NOT DETECTED NOT DETECTED Final   Cryptococcus neoformans/gattii NOT DETECTED NOT DETECTED Final   Meth resistant mecA/C and MREJ DETECTED (A) NOT DETECTED Final    Comment: RESULT CALLED TO, READ BACK BY AND VERIFIED WITH: A,MUHAMMAD RN @1947  01/13/20 EB Performed at [facility], [address] [place], [place] [address]   Culture, blood (Routine X 2) w Reflex to ID Panel     Status: None (Preliminary result)   Collection Time: 01/13/20  9:04 PM   Specimen: BLOOD RIGHT HAND  Result Value Ref Range Status   Specimen Description BLOOD RIGHT HAND  Final   Special Requests   Final    BOTTLES DRAWN AEROBIC AND ANAEROBIC Blood Culture adequate volume   Culture   Final    NO GROWTH 2  DAYS Performed at [id number], [address], [place], [place] [address]    Report Status PENDING  Incomplete  Culture, blood (Routine X 2) w Reflex to ID Panel     Status: None (Preliminary result)   Collection Time: 01/13/20  9:04 PM   Specimen: BLOOD RIGHT HAND  Result Value Ref Range Status   Specimen Description BLOOD RIGHT HAND  Final   Special Requests   Final    BOTTLES DRAWN AEROBIC AND ANAEROBIC Blood Culture adequate volume   Culture   Final    NO GROWTH 2 DAYS Performed at [facility], [address] [place], [place] [address]    Report Status PENDING  Incomplete  Culture, blood (Routine X 2) w Reflex to ID Panel     Status: None (Preliminary result)   Collection Time: 01/15/20  1:46 PM   Specimen: BLOOD RIGHT HAND  Result Value Ref Range Status   Specimen Description   Final    BLOOD RIGHT HAND BOTTLES DRAWN AEROBIC AND ANAEROBIC   Special Requests   Final    Blood Culture adequate volume Performed at [facility], [address] [place], [place] [address]    Culture PENDING  Incomplete   Report Status PENDING  Incomplete  Culture, blood (Routine X 2) w Reflex to ID Panel     Status: None (Preliminary result)   Collection Time: 01/15/20  1:46 PM   Specimen: Right Antecubital; Blood  Result Value Ref Range Status   Specimen Description   Final    RIGHT ANTECUBITAL BOTTLES DRAWN AEROBIC AND ANAEROBIC   Special Requests   Final    Blood Culture adequate volume Performed at [facility], [address], [place], [place] [address]    Culture PENDING  Incomplete   Report Status PENDING  Incomplete     Radiology Studies: No results found.  Scheduled Meds: . amLODipine  5 mg Oral Daily  . aspirin EC  162 mg Oral Daily  . doxazosin  4 mg Oral Daily  . heparin  5,000 Units  Subcutaneous Q8H  . hydrALAZINE  25 mg Oral BID  . insulin aspart  0-15 Units Subcutaneous TID WC  . insulin aspart  0-5 Units Subcutaneous QHS  . insulin aspart  4 Units Subcutaneous TID WC   . insulin glargine  30 Units Subcutaneous BID  . multivitamin  1 tablet Oral Daily  . pantoprazole  40 mg Oral Daily  . tacrolimus  3 mg Oral BID  . Vitamin D (Ergocalciferol)  50,000 Units Oral Q30 days   Continuous Infusions: . sodium chloride 75 mL/hr at 01/12/20 2030  . sodium chloride 75 mL/hr at 01/16/20 0130  . DAPTOmycin (CUBICIN)  IV 650 mg (01/15/20 2114)     LOS: 4 days    Time spent: 35 minutes    Barton Dubois, MD [facility]   To contact the attending provider between 7A-7P or the covering provider during after hours 7P-7A, please log into the web site [email or web address] and access using universal [place] password for that web site. If you do not have the password, please call the hospital operator.  01/16/2020, 12:33 PM

## 2020-01-16 NOTE — Progress Notes (Signed)
Antibiotics:  Anti-infectives (From admission, onward)   Start     Dose/Rate Route Frequency Ordered Stop   01/13/20 2200  DAPTOmycin (CUBICIN) 650 mg in sodium chloride 0.9 % IVPB        650 mg 226 mL/hr over 30 Minutes Intravenous Every 24 hours 01/13/20 2032     01/12/20 2000  DAPTOmycin (CUBICIN) 650 mg in sodium chloride 0.9 % IVPB  Status:  Discontinued        650 mg 226 mL/hr over 30 Minutes Intravenous Daily 01/12/20 1828 01/13/20 2000   01/12/20 1830  metroNIDAZOLE (FLAGYL) IVPB 500 mg  Status:  Discontinued       "And" Linked Group Details   500 mg 100 mL/hr over 60 Minutes Intravenous Every 8 hours 01/12/20 1743 01/13/20 2000   01/12/20 1815  cefTRIAXone (ROCEPHIN) 2 g in sodium chloride 0.9 % 100 mL IVPB  Status:  Discontinued       "And" Linked Group Details   2 g 200 mL/hr over 30 Minutes Intravenous Every 24 hours 01/12/20 1743 01/13/20 2000   01/12/20 1730  vancomycin (VANCOCIN) IVPB 1000 mg/200 mL premix        1,000 mg 200 mL/hr over 60 Minutes Intravenous  Once 01/12/20 1719 01/12/20 1856      Medications: Scheduled Meds: . amLODipine  5 mg Oral Daily  . aspirin EC  162 mg Oral Daily  . doxazosin  4 mg Oral Daily  . heparin  5,000 Units Subcutaneous Q8H  . hydrALAZINE  25 mg Oral BID  . insulin aspart  0-15 Units Subcutaneous TID WC  . insulin aspart  0-5 Units Subcutaneous QHS  . insulin aspart  4 Units Subcutaneous TID WC  . insulin glargine  30 Units Subcutaneous BID  . multivitamin  1 tablet Oral Daily  . pantoprazole  40 mg Oral Daily  . tacrolimus  3 mg Oral BID  . Vitamin D (Ergocalciferol)  50,000 Units Oral Q30 days   Continuous Infusions: . sodium chloride 75 mL/hr at 01/12/20 2030  . sodium chloride 75 mL/hr at 01/16/20 0130  . DAPTOmycin (CUBICIN)  IV 650 mg (01/15/20 2114)   PRN Meds:.    Objective: Weight change:   Intake/Output Summary (Last 24 hours) at 01/16/2020 1344 Last data filed at 01/16/2020 0508 Gross per  24 hour  Intake 1295.8 ml  Output --  Net 1295.8 ml   Blood pressure (!) 170/94, pulse 68, temperature 99.1 F (37.3 C), resp. rate 18, height 5\' 9"  (1.753 m), weight 97.1 kg, SpO2 99 %. Temp:  [98.2 F (36.8 C)-99.1 F (37.3 C)] 99.1 F (37.3 C) (08/23 0430) Pulse Rate:  [67-73] 68 (08/23 0430) Resp:  [16-20] 18 (08/23 0430) BP: (122-170)/(60-94) 170/94 (08/23 0430) SpO2:  [98 %-99 %] 99 % (08/23 0430)  Physical Exam:   CBC:    BMET No results for input(s): NA, K, CL, CO2, GLUCOSE, BUN, CREATININE, CALCIUM in the last 72 hours.   Liver Panel  No results for input(s): PROT, ALBUMIN, AST, ALT, ALKPHOS, BILITOT, BILIDIR, IBILI in the last 72 hours.     Sedimentation Rate No results for input(s): ESRSEDRATE in the last 72 hours. C-Reactive Protein No results for input(s): CRP in the last 72 hours.  Micro Results: Recent Results (from the past 720 hour(s))  WOUND CULTURE     Status: Abnormal (Preliminary result)   Collection Time: 01/12/20  2:12 PM  Result Value Ref Range Status  MICRO NUMBER: [id number]  Preliminary   SPECIMEN QUALITY: Adequate  Preliminary   SOURCE: NOT GIVEN  Preliminary   STATUS: PRELIMINARY  Preliminary   GRAM STAIN:   Preliminary    Rare White blood cells seen No epithelial cells seen Many Gram positive cocci in pairs Few Gram positive bacilli   ISOLATE 1: Staphylococcus aureus (A)  Preliminary    Comment: Heavy growth of Staphylococcus aureus , susceptibility test report to follow.  SARS Coronavirus 2 by RT PCR (hospital order, performed in [facility] hospital lab) Nasopharyngeal Nasopharyngeal Swab     Status: None   Collection Time: 01/12/20  4:06 PM   Specimen: Nasopharyngeal Swab  Result Value Ref Range Status   SARS Coronavirus 2 NEGATIVE NEGATIVE Final    Comment: (NOTE) SARS-CoV-2 target nucleic acids are NOT DETECTED.  The SARS-CoV-2 RNA is generally detectable in upper and lower respiratory specimens during the acute phase of  infection. The lowest concentration of SARS-CoV-2 viral copies this assay can detect is 250 copies / mL. A negative result does not preclude SARS-CoV-2 infection and should not be used as the sole basis for treatment or other patient management decisions.  A negative result may occur with improper specimen collection / handling, submission of specimen other than nasopharyngeal swab, presence of viral mutation(s) within the areas targeted by this assay, and inadequate number of viral copies (<250 copies / mL). A negative result must be combined with clinical observations, patient history, and epidemiological information.  Fact Sheet for Patients:   StrictlyIdeas.no  Fact Sheet for Healthcare Providers: BankingDealers.co.za  This test is not yet approved or  cleared by the [place] FDA and has been authorized for detection and/or diagnosis of SARS-CoV-2 by FDA under an Emergency Use Authorization (EUA).  This EUA will remain in effect (meaning this test can be used) for the duration of the COVID-19 declaration under Section 564(b)(1) of the Act, 21 U.S.C. section 360bbb-3(b)(1), unless the authorization is terminated or revoked sooner.  Performed at [facility], [address], [place], [place] [address]   Culture, blood (routine x 2)     Status: Abnormal   Collection Time: 01/12/20  6:16 PM   Specimen: BLOOD RIGHT HAND  Result Value Ref Range Status   Specimen Description   Final    BLOOD RIGHT HAND Performed at [facility], [address] [place], [place] [address]    Special Requests   Final    BOTTLES DRAWN AEROBIC AND ANAEROBIC Blood Culture adequate volume Performed at [place] [place], [place] [address]    Culture  Setup Time   Final    ANAEROBIC and AEROBIC BOTTLE GRAM POSITIVE COCCI Gram Stain Report Called to,Read Back By and Verified With: DEAN,T@1103  BY MATTHEWS, B 8.20.2021 [facility] Organism ID to follow CRITICAL RESULT CALLED TO, READ BACK BY AND VERIFIED WITH: A,MUHAMMAD RN @1947  01/13/20 EB    Culture (A)  Final    METHICILLIN RESISTANT STAPHYLOCOCCUS AUREUS ENTEROCOCCUS FAECALIS CRITICAL RESULT CALLED TO, READ BACK BY AND VERIFIED WITH: Tally Due [id number] FCP Performed at [facility], [place] [address] [place], [place] [address]    Report Status 01/15/2020 FINAL  Final   Organism ID, Bacteria METHICILLIN RESISTANT STAPHYLOCOCCUS AUREUS  Final   Organism ID, Bacteria ENTEROCOCCUS FAECALIS  Final      Susceptibility   Enterococcus faecalis - MIC*    AMPICILLIN <=2 SENSITIVE Sensitive     VANCOMYCIN 1 SENSITIVE Sensitive  GENTAMICIN SYNERGY SENSITIVE Sensitive     * ENTEROCOCCUS FAECALIS   Methicillin resistant staphylococcus aureus - MIC*    CIPROFLOXACIN >=8 RESISTANT Resistant     ERYTHROMYCIN >=8 RESISTANT Resistant     GENTAMICIN <=0.5 SENSITIVE Sensitive     OXACILLIN >=4 RESISTANT Resistant     TETRACYCLINE >=16 RESISTANT Resistant     VANCOMYCIN <=0.5 SENSITIVE Sensitive     TRIMETH/SULFA <=10 SENSITIVE Sensitive     CLINDAMYCIN <=0.25 SENSITIVE Sensitive     RIFAMPIN <=0.5 SENSITIVE Sensitive     Inducible Clindamycin NEGATIVE Sensitive     * METHICILLIN RESISTANT STAPHYLOCOCCUS AUREUS  Culture, blood (routine x 2)     Status: Abnormal   Collection Time: 01/12/20  6:16 PM   Specimen: BLOOD RIGHT HAND  Result Value Ref Range Status   Specimen Description   Final    BLOOD RIGHT HAND Performed at [facility], [address] [place], [place] [address]    Special Requests   Final    BOTTLES DRAWN AEROBIC AND ANAEROBIC Blood Culture adequate volume Performed at [facility], [address], [place], [place] [address]    Culture  Setup Time   Final    ANAEROBIC AND AEROBIC BOTTLES GRAM POSITIVE COCCI Gram Stain Report Called to,Read Back By and Verified With: DEAN,T@1103  BY MATTHEWS, B 8.20.21 [facility] CRITICAL  VALUE NOTED.  VALUE IS CONSISTENT WITH PREVIOUSLY REPORTED AND CALLED VALUE.    Culture (A)  Final    STAPHYLOCOCCUS AUREUS ENTEROCOCCUS FAECALIS SUSCEPTIBILITIES PERFORMED ON PREVIOUS CULTURE WITHIN THE LAST 5 DAYS. Performed at [facility], [place] [address] [place], [place] [address]    Report Status 01/15/2020 FINAL  Final  Blood Culture ID Panel (Reflexed)     Status: Abnormal   Collection Time: 01/12/20  6:16 PM  Result Value Ref Range Status   Enterococcus faecalis NOT DETECTED NOT DETECTED Final   Enterococcus Faecium NOT DETECTED NOT DETECTED Final   Listeria monocytogenes NOT DETECTED NOT DETECTED Final   Staphylococcus species DETECTED (A) NOT DETECTED Final    Comment: RESULT CALLED TO, READ BACK BY AND VERIFIED WITH: A,MUHAMMAD RN @1947  01/13/20 EB    Staphylococcus aureus (BCID) DETECTED (A) NOT DETECTED Final    Comment: Methicillin (oxacillin)-resistant Staphylococcus aureus (MRSA). MRSA is predictably resistant to beta-lactam antibiotics (except ceftaroline). Preferred therapy is vancomycin unless clinically contraindicated. Patient requires contact precautions if  hospitalized. RESULT CALLED TO, READ BACK BY AND VERIFIED WITH: A,MUHAMMAD RN @1947  01/13/20 EB    Staphylococcus epidermidis NOT DETECTED NOT DETECTED Final   Staphylococcus lugdunensis NOT DETECTED NOT DETECTED Final   Streptococcus species NOT DETECTED NOT DETECTED Final   Streptococcus agalactiae NOT DETECTED NOT DETECTED Final   Streptococcus pneumoniae NOT DETECTED NOT DETECTED Final   Streptococcus pyogenes NOT DETECTED NOT DETECTED Final   A.calcoaceticus-baumannii NOT DETECTED NOT DETECTED Final   Bacteroides fragilis NOT DETECTED NOT DETECTED Final   Enterobacterales NOT DETECTED NOT DETECTED Final   Enterobacter cloacae complex NOT DETECTED NOT DETECTED Final   Escherichia coli NOT DETECTED NOT DETECTED Final   Klebsiella aerogenes NOT DETECTED NOT DETECTED Final   Klebsiella oxytoca NOT  DETECTED NOT DETECTED Final   Klebsiella pneumoniae NOT DETECTED NOT DETECTED Final   Proteus species NOT DETECTED NOT DETECTED Final   Salmonella species NOT DETECTED NOT DETECTED Final   Serratia marcescens NOT DETECTED NOT DETECTED Final   Haemophilus influenzae NOT DETECTED NOT DETECTED Final   Neisseria meningitidis NOT DETECTED NOT DETECTED  Final   Pseudomonas aeruginosa NOT DETECTED NOT DETECTED Final   Stenotrophomonas maltophilia NOT DETECTED NOT DETECTED Final   Candida albicans NOT DETECTED NOT DETECTED Final   Candida auris NOT DETECTED NOT DETECTED Final   Candida glabrata NOT DETECTED NOT DETECTED Final   Candida krusei NOT DETECTED NOT DETECTED Final   Candida parapsilosis NOT DETECTED NOT DETECTED Final   Candida tropicalis NOT DETECTED NOT DETECTED Final   Cryptococcus neoformans/gattii NOT DETECTED NOT DETECTED Final   Meth resistant mecA/C and MREJ DETECTED (A) NOT DETECTED Final    Comment: RESULT CALLED TO, READ BACK BY AND VERIFIED WITH: A,MUHAMMAD RN @1947  01/13/20 EB Performed at [place] [address] [place], [place] [address]   Culture, blood (Routine X 2) w Reflex to ID Panel     Status: None (Preliminary result)   Collection Time: 01/13/20  9:04 PM   Specimen: BLOOD RIGHT HAND  Result Value Ref Range Status   Specimen Description BLOOD RIGHT HAND  Final   Special Requests   Final    BOTTLES DRAWN AEROBIC AND ANAEROBIC Blood Culture adequate volume   Culture   Final    NO GROWTH 2 DAYS Performed at [facility], [address], [place], [place] [address]    Report Status PENDING  Incomplete  Culture, blood (Routine X 2) w Reflex to ID Panel     Status: None (Preliminary result)   Collection Time: 01/13/20  9:04 PM   Specimen: BLOOD RIGHT HAND  Result Value Ref Range Status   Specimen Description BLOOD RIGHT HAND  Final   Special Requests   Final    BOTTLES DRAWN AEROBIC AND ANAEROBIC Blood Culture adequate volume   Culture   Final    NO  GROWTH 2 DAYS Performed at [facility], [address], [place], [place] [address]    Report Status PENDING  Incomplete  Culture, blood (Routine X 2) w Reflex to ID Panel     Status: None (Preliminary result)   Collection Time: 01/15/20  1:46 PM   Specimen: BLOOD RIGHT HAND  Result Value Ref Range Status   Specimen Description   Final    BLOOD RIGHT HAND BOTTLES DRAWN AEROBIC AND ANAEROBIC   Special Requests   Final    Blood Culture adequate volume Performed at [facility], [address], [place], [place] [address]    Culture PENDING  Incomplete   Report Status PENDING  Incomplete  Culture, blood (Routine X 2) w Reflex to ID Panel     Status: None (Preliminary result)   Collection Time: 01/15/20  1:46 PM   Specimen: Right Antecubital; Blood  Result Value Ref Range Status   Specimen Description   Final    RIGHT ANTECUBITAL BOTTLES DRAWN AEROBIC AND ANAEROBIC   Special Requests   Final    Blood Culture adequate volume Performed at [facility], [address] [place], [place] [address]    Culture PENDING  Incomplete   Report Status PENDING  Incomplete    Studies/Results: No results found.    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 male with   59 y.o. 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 MRI did not show clear osteomyelitis and he has been seen by general surgery. His blood cultures have been + for MRSA and E faecalisand he is on daptomycin TTE negative  #1 MRSA bactereamia  --continue dapto --ensure blood clearing --obtain TEE --monitor foot closely, I think he needs better source control    LOS: 4 days   Alcide Evener 01/16/2020, 1:44 PM

## 2020-01-16 NOTE — Progress Notes (Signed)
Pharmacy Antibiotic Note  Raymond [name] is a 59 y.o. male admitted on 01/12/2020 with  diabetic foot infection.  Pharmacy was consulted for Daptomycin dosing for known past history of ostemyelitis treated with daptomycin.  Now re-consulted for MRSA bacteremia.  PMH: CKD s/p renal transplant 03/2015, DM, HTN , h/o osteomyelitis 02/2019 received daptomycin, HLD, critical limb ischemia, h/o revascularization of limb, immunosuppression, pancytopenia, GERD  Assessment: afebrile . Draining abscess of right foot is Improving per surgeon. will need more definitive surgery on foot eventually. MRSA in BCx, f/u repeat cx  and TEE.  at LEAST 6 weeks of IV therapy per ID. CK is 30 . AKI is improving. ECHO with no vegetations. MRI did not show clear osteomyelitis   Plan: Continue Daptomycin 8mg /kg q24 hours = 650 mg IV every 24 hours (using adjusted body weight 81.3 kg)  Monitor renal function and adjust dosage accordingly.  Weekly CK Monitor clinical status, renal function and culture results daily.   Height: 5\' 9"  (175.3 cm) Weight: 97.1 kg (214 lb) IBW/kg (Calculated) : 70.7  Adjusted body weight: 81.3 kg  Temp (24hrs), Avg:98.6 F (37 C), Min:98.2 F (36.8 C), Max:99.1 F (37.3 C)  Recent Labs  Lab 01/12/20 1328 01/13/20 0722  WBC 6.0 8.4  CREATININE 2.10* 1.71*    Estimated Creatinine Clearance: 53.5 mL/min (A) (by C-G formula based on SCr of 1.71 mg/dL (H)).    No Known Allergies  Antimicrobials this admission: daptomycin 8/19>>  Microbiology results: 8/19 Bcx: BCID shows 1. MRSA:  in 2 bottles s- Vancomycin  2. E.faecalis is pan sensitive 8/20 BCX: ngtd  Thank you for allowing pharmacy to be a part of this patient's care.  Isac Sarna, BS Pharm D, BCPS Clinical Pharmacist Pager [phone] 01/16/2020 10:30 AM

## 2020-01-16 NOTE — Progress Notes (Signed)
  Subjective: Minimal pain in right foot.  Objective: Vital signs in last 24 hours: Temp:  [98.6 F (37 C)-99.1 F (37.3 C)] 99.1 F (37.3 C) (08/23 0430) Pulse Rate:  [67-68] 68 (08/23 0430) Resp:  [18-20] 18 (08/23 0430) BP: (122-170)/(60-94) 170/94 (08/23 0430) SpO2:  [98 %-99 %] 99 % (08/23 0430) Last BM Date: 01/15/20  Intake/Output from previous day: 08/22 0701 - 08/23 0700 In: 1775.8 [P.O.:720; I.V.:829.8; IV Piggyback:226] Out: -  Intake/Output this shift: No intake/output data recorded.  General appearance: alert, cooperative and no distress Extremities: Right foot and erythematous. less swollen minimal serosanguineous drainage from opening.  Lab Results:  No results for input(s): WBC, HGB, HCT, PLT in the last 72 hours. BMET No results for input(s): NA, K, CL, CO2, GLUCOSE, BUN, CREATININE, CALCIUM in the last 72 hours. PT/INR No results for input(s): LABPROT, INR in the last 72 hours.  Studies/Results: No results found.  Anti-infectives: Anti-infectives (From admission, onward)   Start     Dose/Rate Route Frequency Ordered Stop   01/13/20 2200  DAPTOmycin (CUBICIN) 650 mg in sodium chloride 0.9 % IVPB        650 mg 226 mL/hr over 30 Minutes Intravenous Every 24 hours 01/13/20 2032     01/12/20 2000  DAPTOmycin (CUBICIN) 650 mg in sodium chloride 0.9 % IVPB  Status:  Discontinued        650 mg 226 mL/hr over 30 Minutes Intravenous Daily 01/12/20 1828 01/13/20 2000   01/12/20 1830  metroNIDAZOLE (FLAGYL) IVPB 500 mg  Status:  Discontinued       "And" Linked Group Details   500 mg 100 mL/hr over 60 Minutes Intravenous Every 8 hours 01/12/20 1743 01/13/20 2000   01/12/20 1815  cefTRIAXone (ROCEPHIN) 2 g in sodium chloride 0.9 % 100 mL IVPB  Status:  Discontinued       "And" Linked Group Details   2 g 200 mL/hr over 30 Minutes Intravenous Every 24 hours 01/12/20 1743 01/13/20 2000   01/12/20 1730  vancomycin (VANCOCIN) IVPB 1000 mg/200 mL premix         1,000 mg 200 mL/hr over 60 Minutes Intravenous  Once 01/12/20 1719 01/12/20 1856      Assessment/Plan: Impression: Right foot cellulitis with abscess, resolving.  We will continue to monitor.  LOS: 4 days    Aviva Signs 01/16/2020

## 2020-01-17 ENCOUNTER — Inpatient Hospital Stay (HOSPITAL_COMMUNITY): Payer: BC Managed Care – PPO | Admitting: Certified Registered"

## 2020-01-17 ENCOUNTER — Encounter (HOSPITAL_COMMUNITY): Payer: Self-pay | Admitting: Internal Medicine

## 2020-01-17 ENCOUNTER — Encounter (HOSPITAL_COMMUNITY)
Admission: EM | Disposition: A | Discharge status: Home / Self Care | Payer: Self-pay | Source: Home / Self Care | Attending: Internal Medicine

## 2020-01-17 ENCOUNTER — Inpatient Hospital Stay (HOSPITAL_COMMUNITY): Payer: BC Managed Care – PPO

## 2020-01-17 DIAGNOSIS — R7881 Bacteremia: Secondary | ICD-10-CM

## 2020-01-17 HISTORY — PX: TEE WITHOUT CARDIOVERSION: SHX5443

## 2020-01-17 LAB — BASIC METABOLIC PANEL
Anion gap: 8 (ref 5–15)
BUN: 23 mg/dL — ABNORMAL HIGH (ref 6–20)
CO2: 19 mmol/L — ABNORMAL LOW (ref 22–32)
Calcium: 8.9 mg/dL (ref 8.9–10.3)
Chloride: 111 mmol/L (ref 98–111)
Creatinine, Ser: 1.05 mg/dL (ref 0.61–1.24)
GFR calc Af Amer: >60 mL/min (ref >60)
GFR calc non Af Amer: >60 mL/min (ref >60)
Glucose, Bld: 240 mg/dL — ABNORMAL HIGH (ref 70–99)
Potassium: 4.1 mmol/L (ref 3.5–5.1)
Sodium: 138 mmol/L (ref 135–145)

## 2020-01-17 LAB — GLUCOSE, CAPILLARY
Glucose-Capillary: 98 mg/dL (ref 70–99)
Glucose-Capillary: 126 mg/dL — ABNORMAL HIGH (ref 70–99)
Glucose-Capillary: 220 mg/dL — ABNORMAL HIGH (ref 70–99)
Glucose-Capillary: 176 mg/dL — ABNORMAL HIGH (ref 70–99)
Glucose-Capillary: 151 mg/dL — ABNORMAL HIGH (ref 70–99)

## 2020-01-17 LAB — CBC
HCT: 42.3 % (ref 39.0–52.0)
Hemoglobin: 12.6 g/dL — ABNORMAL LOW (ref 13.0–17.0)
MCH: 25.1 pg — ABNORMAL LOW (ref 26.0–34.0)
MCHC: 29.8 g/dL — ABNORMAL LOW (ref 30.0–36.0)
MCV: 84.4 fL (ref 80.0–100.0)
Platelets: 144 10*3/uL — ABNORMAL LOW (ref 150–400)
RBC: 5.01 MIL/uL (ref 4.22–5.81)
RDW: 14.3 % (ref 11.5–15.5)
WBC: 6.1 10*3/uL (ref 4.0–10.5)
nRBC: 0 % (ref 0.0–0.2)

## 2020-01-17 LAB — WOUND CULTURE
MICRO NUMBER:: 10847703
SPECIMEN QUALITY:: ADEQUATE

## 2020-01-17 LAB — HOUSE ACCOUNT TRACKING

## 2020-01-17 SURGERY — ECHOCARDIOGRAM, TRANSESOPHAGEAL
Anesthesia: General

## 2020-01-17 MED ORDER — PROPOFOL 500 MG/50ML IV EMUL
INTRAVENOUS | Status: DC | PRN
  Administered 2020-01-17: 100 mg via INTRAVENOUS
  Administered 2020-01-17: 175 ug/kg/min via INTRAVENOUS

## 2020-01-17 MED ORDER — POVIDONE-IODINE 10 % EX SOLN
CUTANEOUS | Status: AC
  Filled 2020-01-17: qty 15

## 2020-01-17 MED ORDER — LIDOCAINE VISCOUS HCL 2 % MT SOLN
OROMUCOSAL | Status: DC | PRN
  Administered 2020-01-17: 1 via OROMUCOSAL

## 2020-01-17 MED ORDER — LIDOCAINE HCL (CARDIAC) PF 50 MG/5ML IV SOSY
PREFILLED_SYRINGE | INTRAVENOUS | Status: DC | PRN
  Administered 2020-01-17: 40 mg via INTRAVENOUS

## 2020-01-17 MED ORDER — LACTATED RINGERS IV SOLN: INTRAVENOUS | Status: DC

## 2020-01-17 MED ORDER — LIDOCAINE VISCOUS HCL 2 % MT SOLN
OROMUCOSAL | Status: AC
  Filled 2020-01-17: qty 15

## 2020-01-17 NOTE — Progress Notes (Signed)
*  PRELIMINARY RESULTS* Echocardiogram Echocardiogram Transesophageal has been performed.  Leavy Cella 01/17/2020, 12:16 PM

## 2020-01-17 NOTE — Progress Notes (Signed)
PROGRESS NOTE    Raymond [name]  HEN:[id number] DOB: Oct 15, 1960 DOA: 01/12/2020 PCP: [name], FNP    Chief Complaint  Patient presents with  .  Right diabetic foot ulcer.    Brief Narrative:  As per H&P written by Dr. Waldron Labs on 01/12/2020  Raymond [name]  is a 59 y.o. 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, it was sent by podiatry for evaluation for wound in the right plantar foot, it was seen today by podiatry, and he had a concern of worsening foot infection, so he was sent to ED for further evaluation, patient reports symptoms has been going on for last 2 weeks, and currently he is with purulent foul-smelling odor, as well there was a concern for possible DVT in his right lower extremity given swelling, patient was at [facility] ED yesterday, and he left prior to getting his imaging study done, labs during that visit showing elevated D-dimers greater than 19, patient himself denies any chest pain, palpitation, dizziness, he does report mild dyspnea, report he has been compliant with his Lantus and glipizide. - in ED work-up significant for pseudohyponatremia sodium of 128, creatinine of 2.1 from baseline 1.3, glucose of 609, but normal anion gap, and bicarb of 21, had low platelet at 81, negative, he is unvaccinated, started on vancomycin, [facility] consulted to admit.   Assessment & Plan: 1-right diabetic foot ulcer/MRSA bacteremia: Plantar aspect  -Open wound with purulent discharge -Present for the last 2 weeks and worsening -No tenderness secondary to neuropathy -High risk for becoming diffusely infected as patient has underlying history immunosuppressant due to renal transplant status.   -Continue current antibiotics, on Daptomycin. -Repeat blood cultures has remained without growth, will plan for PICC line placement on  01/18/2020 -ID has recommended to check TEE; discussed with cardiology service and will be done later today 01/17/20. -MRI suggesting developing abscess and osteonecrosis; general surgery has been consulted -Will follow recommendations. -Continue wound care and soaking bath with Bethadine, no surgical intervention currently.   2-Acute kidney injury on chronic kidney disease; patient with prior history of renal transplant -has chronically Remained in a stage IIIa renal failure. -Continue minimizing nephrotoxic agents -Follow renal function trend -Gentle fluid resuscitation provided. -Continue Prograf. -Cr at baseline now, will monitor trend.  3-Immunosuppression ([place]ia) -Continue Prograf  -History of renal transplant.  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 -Follow CBGs -Modified carbohydrate diet has been encouraged. -Close follow-up after discharge with patient's endocrinologist has been recommended.  5-Essential hypertension, benign -Stable overall -Continue current antihypertensive regimen. -Follow vital signs.  6-Class 1 obesity due to excess calories with serious comorbidity and body mass index (BMI) of 31.0 to 31.9 in adult -Low calorie diet, portion control and increase physical activity discussed with patient -Body mass index is 31.6 kg/m.  7-elevated D-dimer/right lower extremity superficial thrombophlebitis -No DVT and SVT not close to femoral vein, for that no anticoagulation required -VQ scan also negative for concerns of pulmonary embolism. -Continue supportive care, TED hoses usage and 162mg  of Aspirin.  -continue PRN analgesics.    DVT prophylaxis: Heparin Code Status: Full code Family Communication: No family at bedside. Disposition:   Status is: Inpatient  Dispo: The patient is from: Home              Anticipated d/c  is to: Home               Anticipated d/c date is: 2-3 days              Patient currently no medically stable for discharge; right foot diabetic ulcer open and actively draining with positive MRSA blood cultures.  Continue current use of IV daptomycin and follow  TEE results, TEE later today. Will repeat blood cx's. Some mild osteonecrosis appreciated but no frank osteomyelitis.  Appreciate assistance by general surgery and infectious disease services.  Continue wound care and current IV antibiotics.    Consultants:   General surgery  Infectious disease (Dr. Tommy Medal) recommendations given to use daptomycin and to check TEE.  Cardiology service: Consulted for TEE; plan is for procedure to be done later today 01/17/20   Procedures:  See below for x-ray report   Antimicrobials:  Daptomycin 01/12/20 ceftriaxone and Flagyl 01/12/20>>>01/14/20   Subjective: No fever, no chest pain, no nausea, no vomiting, no shortness of breath.  Patient reports being hungry.  No overnight events.  Objective: Vitals:   01/17/20 1020 01/17/20 1146 01/17/20 1148 01/17/20 1154  BP:  (!) 86/56 (!) 100/58 115/67  Pulse: 64 (!) 57 (!) 55 (!) 55  Resp: 20 (!) 24 (!) 26 (!) 22  Temp: 98.2 F (36.8 C) 97.7 F (36.5 C)    TempSrc: Oral Oral    SpO2: 97% 93% 95% 96%  Weight:      Height:        Intake/Output Summary (Last 24 hours) at 01/17/2020 1502 Last data filed at 01/17/2020 1154 Gross per 24 hour  Intake 600 ml  Output --  Net 600 ml   Filed Weights   01/12/20 1253  Weight: 97.1 kg    Examination: General exam: Alert, awake, oriented x 3, expressing to be hungry, no fever, no nausea, no shortness of breath, no chest pain.  Reports right lower extremity discomfort better after receiving Percocet on 01/16/2020.  Right plantar foot wound continues to have decreased drainage and no superimposed signs of erythematous changes or worsening infection. Respiratory system: Clear to auscultation. Respiratory effort  normal. Cardiovascular system:RRR. No murmurs, rubs, gallops. Gastrointestinal system: Abdomen is nondistended, soft and nontender. No organomegaly or masses felt. Normal bowel sounds heard. Central nervous system: Alert and oriented. No focal neurological deficits. Extremities: No cyanosis or clubbing; trace to 1+ lower extremity edema on his right lower leg.  Cordlike sensation in the medial/posterior aspect or SVT is present. Skin: No rashes, no petechiae; right plantar wound continue to demonstrate signs of improvement and decreased drainage.  No foul smell appreciated. Psychiatry: Judgement and insight appear normal. Mood & affect appropriate.    Data Reviewed: I have personally reviewed following labs and imaging studies  CBC: Recent Labs  Lab 01/12/20 1328 01/13/20 0722 01/17/20 0545  WBC 6.0 8.4 6.1  NEUTROABS 5.0  --   --   HGB 14.1 11.3* 12.6*  HCT 45.2 37.0* 42.3  MCV 82.8 83.5 84.4  PLT 81* 66* 144*    Basic Metabolic Panel: Recent Labs  Lab 01/12/20 1328 01/13/20 0722 01/17/20 0545  NA 128* 136 138  K 4.7 3.8 4.1  CL 96* 109 111  CO2 21* 18* 19*  GLUCOSE 609* 166* 240*  BUN 55* 60* 23*  CREATININE 2.10* 1.71* 1.05  CALCIUM 9.7 8.4* 8.9    GFR: Estimated Creatinine Clearance: 87.1 mL/min (by C-G formula based on SCr of 1.05 mg/dL).  Liver Function Tests:  Recent Labs  Lab 01/13/20 0722  AST 15  ALT 17  ALKPHOS 97  BILITOT 0.8  PROT 6.1*  ALBUMIN 2.5*    CBG: Recent Labs  Lab 01/16/20 1123 01/16/20 1657 01/16/20 1954 01/17/20 0839 01/17/20 1032  GLUCAP 143* 98 126* 220* 176*     Recent Results (from the past 240 hour(s))  WOUND CULTURE     Status: Abnormal   Collection Time: 01/12/20  2:12 PM  Result Value Ref Range Status   MICRO NUMBER: [id number]  Final   SPECIMEN QUALITY: Adequate  Final   SOURCE: NOT GIVEN  Final   STATUS: FINAL  Final   GRAM STAIN:   Final    Rare White blood cells seen No epithelial cells seen Many Gram  positive cocci in pairs Few Gram positive bacilli   ISOLATE 1: methicillin resistant Staphylococcus aureus (A)  Final    Comment: Heavy growth of Methicillin resistant Staphylococcus aureus (MRSA) Negative for inducible clindamycin resistance.      Susceptibility   Methicillin resistant staphylococcus aureus - AEROBIC CULT, GRAM STAIN POSITIVE 1    VANCOMYCIN 1 Sensitive     CIPROFLOXACIN >=8 Resistant     CLINDAMYCIN <=0.25 Sensitive     LEVOFLOXACIN 4 Resistant     ERYTHROMYCIN >=8 Resistant     GENTAMICIN <=0.5 Sensitive     OXACILLIN >=4 Resistant     TETRACYCLINE >=16 Resistant     TRIMETH/SULFA* <=10 Sensitive      * Legend:S = Susceptible  I = IntermediateR = Resistant  NS = Not susceptible* = Not tested  NR = Not reported**NN = See antimicrobic comments  SARS Coronavirus 2 by RT PCR (hospital order, performed in [place] hospital lab) Nasopharyngeal Nasopharyngeal Swab     Status: None   Collection Time: 01/12/20  4:06 PM   Specimen: Nasopharyngeal Swab  Result Value Ref Range Status   SARS Coronavirus 2 NEGATIVE NEGATIVE Final    Comment: (NOTE) SARS-CoV-2 target nucleic acids are NOT DETECTED.  The SARS-CoV-2 RNA is generally detectable in upper and lower respiratory specimens during the acute phase of infection. The lowest concentration of SARS-CoV-2 viral copies this assay can detect is 250 copies / mL. A negative result does not preclude SARS-CoV-2 infection and should not be used as the sole basis for treatment or other patient management decisions.  A negative result may occur with improper specimen collection / handling, submission of specimen other than nasopharyngeal swab, presence of viral mutation(s) within the areas targeted by this assay, and inadequate number of viral copies (<250 copies / mL). A negative result must be combined with clinical observations, patient history, and epidemiological information.  Fact Sheet for Patients:    StrictlyIdeas.no  Fact Sheet for Healthcare Providers: BankingDealers.co.za  This test is not yet approved or  cleared by the [place] FDA and has been authorized for detection and/or diagnosis of SARS-CoV-2 by FDA under an Emergency Use Authorization (EUA).  This EUA will remain in effect (meaning this test can be used) for the duration of the COVID-19 declaration under Section 564(b)(1) of the Act, 21 U.S.C. section 360bbb-3(b)(1), unless the authorization is terminated or revoked sooner.  Performed at [facility], [address] [place], [place] [address]   Culture, blood (routine x 2)     Status: Abnormal   Collection Time: 01/12/20  6:16 PM   Specimen: BLOOD RIGHT HAND  Result Value Ref Range Status   Specimen Description   Final    BLOOD  RIGHT HAND Performed at [facility], [address] [place], [place] [address]    Special Requests   Final    BOTTLES DRAWN AEROBIC AND ANAEROBIC Blood Culture adequate volume Performed at [address] [place], [place] [address]    Culture  Setup Time   Final    ANAEROBIC and AEROBIC BOTTLE GRAM POSITIVE COCCI Gram Stain Report Called to,Read Back By and Verified With: DEAN,T@1103  BY MATTHEWS, B 8.20.2021 [facility] Organism ID to follow CRITICAL RESULT CALLED TO, READ BACK BY AND VERIFIED WITH: A,MUHAMMAD RN @1947  01/13/20 EB    Culture (A)  Final    METHICILLIN RESISTANT STAPHYLOCOCCUS AUREUS ENTEROCOCCUS FAECALIS CRITICAL RESULT CALLED TO, READ BACK BY AND VERIFIED WITH: Tally Due [id number] FCP Performed at [facility], [place] [address], [place], [place] [address]    Report Status 01/15/2020 FINAL  Final   Organism ID, Bacteria METHICILLIN RESISTANT STAPHYLOCOCCUS AUREUS  Final   Organism ID, Bacteria ENTEROCOCCUS FAECALIS  Final      Susceptibility   Enterococcus faecalis - MIC*    AMPICILLIN <=2 SENSITIVE Sensitive     VANCOMYCIN 1  SENSITIVE Sensitive     GENTAMICIN SYNERGY SENSITIVE Sensitive     * ENTEROCOCCUS FAECALIS   Methicillin resistant staphylococcus aureus - MIC*    CIPROFLOXACIN >=8 RESISTANT Resistant     ERYTHROMYCIN >=8 RESISTANT Resistant     GENTAMICIN <=0.5 SENSITIVE Sensitive     OXACILLIN >=4 RESISTANT Resistant     TETRACYCLINE >=16 RESISTANT Resistant     VANCOMYCIN <=0.5 SENSITIVE Sensitive     TRIMETH/SULFA <=10 SENSITIVE Sensitive     CLINDAMYCIN <=0.25 SENSITIVE Sensitive     RIFAMPIN <=0.5 SENSITIVE Sensitive     Inducible Clindamycin NEGATIVE Sensitive     * METHICILLIN RESISTANT STAPHYLOCOCCUS AUREUS  Culture, blood (routine x 2)     Status: Abnormal   Collection Time: 01/12/20  6:16 PM   Specimen: BLOOD RIGHT HAND  Result Value Ref Range Status   Specimen Description   Final    BLOOD RIGHT HAND Performed at [facility], [address], [place], [place] [address]    Special Requests   Final    BOTTLES DRAWN AEROBIC AND ANAEROBIC Blood Culture adequate volume Performed at [facility], [address] [place], [place] [address]    Culture  Setup Time   Final    ANAEROBIC AND AEROBIC BOTTLES GRAM POSITIVE COCCI Gram Stain Report Called to,Read Back By and Verified With: DEAN,T@1103  BY MATTHEWS, B 8.20.21 [place] CRITICAL VALUE NOTED.  VALUE IS CONSISTENT WITH PREVIOUSLY REPORTED AND CALLED VALUE.    Culture (A)  Final    STAPHYLOCOCCUS AUREUS ENTEROCOCCUS FAECALIS SUSCEPTIBILITIES PERFORMED ON PREVIOUS CULTURE WITHIN THE LAST 5 DAYS. Performed at [facility], [place] [address] [place], [place] [address]    Report Status 01/15/2020 FINAL  Final  Blood Culture ID Panel (Reflexed)     Status: Abnormal   Collection Time: 01/12/20  6:16 PM  Result Value Ref Range Status   Enterococcus faecalis NOT DETECTED NOT DETECTED Final   Enterococcus Faecium NOT DETECTED NOT DETECTED Final   Listeria monocytogenes NOT DETECTED NOT DETECTED Final   Staphylococcus species  DETECTED (A) NOT DETECTED Final    Comment: RESULT CALLED TO, READ BACK BY AND VERIFIED WITH: A,MUHAMMAD RN @1947  01/13/20 EB    Staphylococcus aureus (BCID) DETECTED (A) NOT DETECTED Final    Comment: Methicillin (oxacillin)-resistant Staphylococcus aureus (MRSA). MRSA  is predictably resistant to beta-lactam antibiotics (except ceftaroline). Preferred therapy is vancomycin unless clinically contraindicated. Patient requires contact precautions if  hospitalized. RESULT CALLED TO, READ BACK BY AND VERIFIED WITH: A,MUHAMMAD RN @1947  01/13/20 EB    Staphylococcus epidermidis NOT DETECTED NOT DETECTED Final   Staphylococcus lugdunensis NOT DETECTED NOT DETECTED Final   Streptococcus species NOT DETECTED NOT DETECTED Final   Streptococcus agalactiae NOT DETECTED NOT DETECTED Final   Streptococcus pneumoniae NOT DETECTED NOT DETECTED Final   Streptococcus pyogenes NOT DETECTED NOT DETECTED Final   A.calcoaceticus-baumannii NOT DETECTED NOT DETECTED Final   Bacteroides fragilis NOT DETECTED NOT DETECTED Final   Enterobacterales NOT DETECTED NOT DETECTED Final   Enterobacter cloacae complex NOT DETECTED NOT DETECTED Final   Escherichia coli NOT DETECTED NOT DETECTED Final   Klebsiella aerogenes NOT DETECTED NOT DETECTED Final   Klebsiella oxytoca NOT DETECTED NOT DETECTED Final   Klebsiella pneumoniae NOT DETECTED NOT DETECTED Final   Proteus species NOT DETECTED NOT DETECTED Final   Salmonella species NOT DETECTED NOT DETECTED Final   Serratia marcescens NOT DETECTED NOT DETECTED Final   Haemophilus influenzae NOT DETECTED NOT DETECTED Final   Neisseria meningitidis NOT DETECTED NOT DETECTED Final   Pseudomonas aeruginosa NOT DETECTED NOT DETECTED Final   Stenotrophomonas maltophilia NOT DETECTED NOT DETECTED Final   Candida albicans NOT DETECTED NOT DETECTED Final   Candida auris NOT DETECTED NOT DETECTED Final   Candida glabrata NOT DETECTED NOT DETECTED Final   Candida krusei NOT DETECTED  NOT DETECTED Final   Candida parapsilosis NOT DETECTED NOT DETECTED Final   Candida tropicalis NOT DETECTED NOT DETECTED Final   Cryptococcus neoformans/gattii NOT DETECTED NOT DETECTED Final   Meth resistant mecA/C and MREJ DETECTED (A) NOT DETECTED Final    Comment: RESULT CALLED TO, READ BACK BY AND VERIFIED WITH: A,MUHAMMAD RN @1947  01/13/20 EB Performed at [facility], [address] [place], [place] [address]   Culture, blood (Routine X 2) w Reflex to ID Panel     Status: None (Preliminary result)   Collection Time: 01/13/20  9:04 PM   Specimen: BLOOD RIGHT HAND  Result Value Ref Range Status   Specimen Description BLOOD RIGHT HAND  Final   Special Requests   Final    BOTTLES DRAWN AEROBIC AND ANAEROBIC Blood Culture adequate volume   Culture   Final    NO GROWTH 2 DAYS Performed at [facility], [address], [place], [place] [address]    Report Status PENDING  Incomplete  Culture, blood (Routine X 2) w Reflex to ID Panel     Status: None (Preliminary result)   Collection Time: 01/13/20  9:04 PM   Specimen: BLOOD RIGHT HAND  Result Value Ref Range Status   Specimen Description BLOOD RIGHT HAND  Final   Special Requests   Final    BOTTLES DRAWN AEROBIC AND ANAEROBIC Blood Culture adequate volume   Culture   Final    NO GROWTH 2 DAYS Performed at [facility], [address], [place],  [address]    Report Status PENDING  Incomplete  Culture, blood (Routine X 2) w Reflex to ID Panel     Status: None (Preliminary result)   Collection Time: 01/15/20  1:46 PM   Specimen: BLOOD RIGHT HAND  Result Value Ref Range Status   Specimen Description   Final    BLOOD RIGHT HAND BOTTLES DRAWN AEROBIC AND ANAEROBIC   Special Requests   Final    Blood Culture  adequate volume Performed at [facility], [address] [place], [place] [address]    Culture PENDING  Incomplete   Report Status PENDING  Incomplete  Culture, blood (Routine X 2) w Reflex to ID Panel     Status:  None (Preliminary result)   Collection Time: 01/15/20  1:46 PM   Specimen: Right Antecubital; Blood  Result Value Ref Range Status   Specimen Description   Final    RIGHT ANTECUBITAL BOTTLES DRAWN AEROBIC AND ANAEROBIC   Special Requests   Final    Blood Culture adequate volume Performed at [facility], [address], [place], [place] [address]    Culture PENDING  Incomplete   Report Status PENDING  Incomplete     Radiology Studies: ECHO TEE  Result Date: 01/17/2020    TRANSESOPHOGEAL ECHO REPORT   Patient Name:   Raymond [name] Date of Exam: 01/17/2020 Medical Rec #:  [id number]       Height:       69.0 in Accession #:    [id number]      Weight:       214.0 lb Date of Birth:  1961-03-07       BSA:          2.126 m Patient Age:    81 years        BP:           115/67 mmHg Patient Gender: M               HR:           55 bpm. Exam Location:  [facility] Procedure: Transesophageal Echo Indications:    Bacteremia 790.7 / R78.81  History:        Patient has prior history of Echocardiogram examinations, most                 recent 01/14/2020. Risk Factors:Diabetes, Hypertension,                 Dyslipidemia and Non-Smoker. Immunosuppression , GERD.  Sonographer:    Leavy Cella RDCS (AE) Referring Phys: [id number] Alphonse Guild BRANCH PROCEDURE: The transesophogeal probe was passed without difficulty through the esophogus of the patient. Sedation performed by performing physician. The patient was monitored while under deep sedation. Anesthestetic sedation was provided intravenously by  Anesthesiology: 252mg  of Propofol. The patient developed no complications during the procedure. IMPRESSIONS  1. Left ventricular ejection fraction, by estimation, is 55 to 60%. The left ventricle has normal function. The left ventricle has no regional wall motion abnormalities.  2. Right ventricular systolic function is normal. The right ventricular size is normal.  3. Left atrial size was mildly dilated. No left atrial/left  atrial appendage thrombus was detected. The LAA emptying velocity was 90 cm/s.  4. Right atrial size was mildly dilated.  5. The mitral valve is normal in structure. Trivial mitral valve regurgitation. No evidence of mitral stenosis.  6. The aortic valve is tricuspid. Aortic valve regurgitation is not visualized. No aortic stenosis is present. Conclusion(s)/Recommendation(s): No evidence of vegetation/infective endocarditis on this transesophageal echocardiogram. FINDINGS  Left Ventricle: Left ventricular ejection fraction, by estimation, is 55 to 60%. The left ventricle has normal function. The left ventricle has no regional wall motion abnormalities. The left ventricular internal cavity size was normal in size. There is  no left ventricular hypertrophy. Right Ventricle: The right ventricular size is normal. No increase in right ventricular wall thickness. Right ventricular systolic function is normal. Left Atrium: Left  atrial size was mildly dilated. No left atrial/left atrial appendage thrombus was detected. The LAA emptying velocity was 90 cm/s. Right Atrium: Right atrial size was mildly dilated. Pericardium: There is no evidence of pericardial effusion. Mitral Valve: The mitral valve is normal in structure. Trivial mitral valve regurgitation. No evidence of mitral valve stenosis. Tricuspid Valve: The tricuspid valve is normal in structure. Tricuspid valve regurgitation is trivial. No evidence of tricuspid stenosis. Aortic Valve: The aortic valve is tricuspid. Aortic valve regurgitation is not visualized. No aortic stenosis is present. Pulmonic Valve: The pulmonic valve was not well visualized. Pulmonic valve regurgitation is not visualized. No evidence of pulmonic stenosis. Aorta: The aortic root is normal in size and structure. IAS/Shunts: No atrial level shunt detected by color flow Doppler. [name] MD Electronically signed by [name] MD Signature Date/Time: 01/17/2020/12:21:15 PM    Final      Scheduled Meds: . amLODipine  5 mg Oral Daily  . aspirin EC  162 mg Oral Daily  . doxazosin  4 mg Oral Daily  . heparin  5,000 Units Subcutaneous Q8H  . hydrALAZINE  25 mg Oral BID  . insulin aspart  0-15 Units Subcutaneous TID WC  . insulin aspart  0-5 Units Subcutaneous QHS  . insulin aspart  4 Units Subcutaneous TID WC  . insulin glargine  30 Units Subcutaneous BID  . lidocaine      . multivitamin  1 tablet Oral Daily  . pantoprazole  40 mg Oral Daily  . tacrolimus  3 mg Oral BID  . Vitamin D (Ergocalciferol)  50,000 Units Oral Q30 days   Continuous Infusions: . sodium chloride 75 mL/hr at 01/16/20 1707  . sodium chloride 75 mL/hr at 01/16/20 0130  . DAPTOmycin (CUBICIN)  IV 650 mg (01/16/20 2238)     LOS: 5 days    Time spent: 22 minutes    Barton Dubois, MD [facility]   To contact the attending provider between 7A-7P or the covering provider during after hours 7P-7A, please log into the web site [email or web address] and access using universal  password for that web site. If you do not have the password, please call the hospital operator.  01/17/2020, 3:02 PM

## 2020-01-17 NOTE — CV Procedure (Cosign Needed)
Procedure: TEE Physician: Dr [name] Indication: Bacteremia   Patient was brought to the procedure suite after appropriate consent was obtained. 2% viscous lidocaine was used to anesthesize the posterior oropharnxnx. Moderate sedation was achieved with the assistance of anesthesiology, please refer to there note for full details. Bite block placed, patient placed in the left lateral decubitis position. The TEE probe was intubated into the esophagus without difficulty and several views obtained. Cardiopulmonary monitoring was performed throughout the procedure, the patient tolerated well without complications.  No evidence of vegetations by TEE. See full TEE report   [name] MD

## 2020-01-17 NOTE — Anesthesia Preprocedure Evaluation (Signed)
Anesthesia Evaluation  Patient identified by MRN, date of birth, ID band Patient awake    Reviewed: Allergy & Precautions, H&P , NPO status , Patient's Chart, lab work & pertinent test results, reviewed documented beta blocker date and time   Airway Mallampati: II  TM Distance: >3 FB Neck ROM: full    Dental no notable dental hx. (+) Teeth Intact   Pulmonary neg pulmonary ROS,    Pulmonary exam normal breath sounds clear to auscultation       Cardiovascular Exercise Tolerance: Good hypertension, + Peripheral Vascular Disease   Rhythm:regular Rate:Normal     Neuro/Psych negative neurological ROS  negative psych ROS   GI/Hepatic Neg liver ROS, GERD  Medicated,  Endo/Other  negative endocrine ROSdiabetes, Type 2  Renal/GU Renal disease  negative genitourinary   Musculoskeletal   Abdominal   Peds  Hematology negative hematology ROS (+)   Anesthesia Other Findings   Reproductive/Obstetrics negative OB ROS                             Anesthesia Physical Anesthesia Plan  ASA: III  Anesthesia Plan: General   Post-op Pain Management:    Induction:   PONV Risk Score and Plan: 2 and Propofol infusion  Airway Management Planned:   Additional Equipment:   Intra-op Plan:   Post-operative Plan:   Informed Consent: I have reviewed the patients History and Physical, chart, labs and discussed the procedure including the risks, benefits and alternatives for the proposed anesthesia with the patient or authorized representative who has indicated his/her understanding and acceptance.     Dental Advisory Given  Plan Discussed with: CRNA  Anesthesia Plan Comments:         Anesthesia Quick Evaluation

## 2020-01-17 NOTE — Consult Note (Signed)
    [facility] has been requested to perform a transesophageal echocardiogram on Raymond [name] for MRSA bacteremia.  After careful review of history and examination, the risks and benefits of transesophageal echocardiogram have been explained including risks of esophageal damage, perforation (1:10,000 risk), bleeding, pharyngeal hematoma as well as other potential complications associated with conscious sedation including aspiration, arrhythmia, respiratory failure and death. Alternatives to treatment were discussed, questions were answered. Patient is willing to proceed.   BP has been stable/elevated with no requirement for pressor support. Hgb stable at 12.6 and platelets 144. Electrolytes WNL. Initially did not have an NPO order but confirmed with the patient he did not consume breakfast. Continue with plans for TEE later today.   Erma Heritage, PA-C  01/17/2020 8:02 AM

## 2020-01-17 NOTE — Anesthesia Postprocedure Evaluation (Signed)
Anesthesia Post Note  Patient: Raymond [name]  Procedure(s) Performed: TRANSESOPHAGEAL ECHOCARDIOGRAM (TEE) WITH PROPOFOL (N/A )  Patient location during evaluation: Endoscopy Anesthesia Type: General Level of consciousness: awake, oriented, awake and alert and patient cooperative Pain management: pain level controlled Vital Signs Assessment: post-procedure vital signs reviewed and stable Respiratory status: spontaneous breathing, respiratory function stable and nonlabored ventilation Cardiovascular status: blood pressure returned to baseline and stable Postop Assessment: no headache and no backache Anesthetic complications: no   No complications documented.   Last Vitals:  Vitals:   01/17/20 0532 01/17/20 1020  BP: (!) 162/91   Pulse: 72 64  Resp: 16 20  Temp: 37 C 36.8 C  SpO2: 100% 97%    Last Pain:  Vitals:   01/17/20 1020  TempSrc: Oral  PainSc: 0-No pain                 Tacy Learn

## 2020-01-17 NOTE — H&P (Signed)
Patent for TEE with assistance from anesthesia for bacteremia. Please refer to referenced H&P for full medical history.   [name] MD   Raymond [name], is a 59 y.o. male  MRN: [id number]   DOB - 09-12-1960  Admit Date - 01/12/2020  Outpatient Primary MD for the patient is [name], [name]  Referring MD/NP/PA: [name]  Patient coming from: Podiatry office     Chief Complaint  Patient presents with  . Claudication      HPI:    Raymond [name]  is a 59 y.o. 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, it was sent by podiatry for evaluation for wound in the right plantar foot, it was seen today by podiatry, and he had a concern of worsening foot infection, so he was sent to ED for further evaluation, patient reports symptoms has been going on for last 2 weeks, and currently he is with purulent foul-smelling odor, as well there was a concern for possible DVT in his right lower extremity given swelling, patient was at [facility] ED yesterday, and he left prior to getting his imaging study done, labs during that visit showing elevated D-dimers greater than 19, patient himself denies any chest pain, palpitation, dizziness, he does report mild dyspnea, report he has been compliant with his Lantus and glipizide. - in ED work-up significant for pseudohyponatremia sodium of 128, creatinine of 2.1 from baseline 1.3, glucose of 609, but normal anion gap, and bicarb of 21, had low platelet at 81, negative, he is unvaccinated, started on vancomycin, [facility] hospitalist consulted to admit.    Review of systems:    In addition to the HPI above,  No Fever-chills, No Headache, No changes with Vision or hearing, No problems swallowing food or Liquids, No Chest pain, Cough, he does report mild dyspnea. No Abdominal pain, No Nausea or Vommitting, Bowel  movements are regular, No Blood in stool or Urine, No dysuria,  report wound with discharge from plantar surface of right foot No new joints pains-aches,  No new weakness, tingling, numbness in any extremity, No recent weight gain or loss, No polyuria, polydypsia or polyphagia, No significant Mental Stressors.  A full 10 point Review of Systems was done, except as stated above, all other Review of Systems were negative.   With Past History of the following :        Past Medical History:  Diagnosis Date  . Chronic kidney disease    Awaiting transplant  . Diabetes ([place])   . Diabetes mellitus without complication ([place])   . Hypertension   . Renal disorder            Past Surgical History:  Procedure Laterality Date  . COLONOSCOPY N/A 12/05/2014   VOJ:[id number] external and internal hemorrhoid/mild diverticulosis/11 polyps removed  . ESOPHAGOGASTRODUODENOSCOPY N/A 12/05/2014   EXH:BZJI duodenitis  . IR FLUORO GUIDE CV LINE RIGHT  03/01/2019  . IR REMOVAL TUN CV CATH W/O FL  05/10/2019  . IR US GUIDE VASC ACCESS RIGHT  03/01/2019  . KIDNEY TRANSPLANT  03/2015      Social History:     Social History        Tobacco Use  . Smoking status: Never Smoker  . Smokeless tobacco: Never Used  Substance Use Topics  . Alcohol use: No    Alcohol/week: 0.0 standard drinks      Family History :  Family History  Problem Relation Age of Onset  . Diabetes Mother   . Heart disease Father   . Colon cancer Neg Hx     Home Medications:          Prior to Admission medications   Medication Sig Start Date End Date Taking? Authorizing Provider  amLODipine (NORVASC) 5 MG tablet Take by mouth.    [provider]  Blood Glucose Monitoring Suppl (ACCU-CHEK GUIDE ME) w/Device KIT 1 Piece by Does not apply route as directed. 02/18/18   [name], MD  doxazosin (CARDURA) 4 MG tablet Take by mouth. 12/18/15   [provider]  glipiZIDE (GLUCOTROL XL) 5 MG 24 hr tablet Take by mouth.    [provider]  glucose blood (ACCU-CHEK GUIDE) test strip Use as instructed 4 x daily. E11.65 02/19/18   [name], MD  hydrALAZINE (APRESOLINE) 25 MG tablet Take 25 mg by mouth 2 (two) times daily.    [provider]  insulin glargine (LANTUS SOLOSTAR) 100 UNIT/ML Solostar Pen INJECT 60 UNITS SUBCUTANEOUSLY AT BEDTIME 08/09/19   [name], [name], MD  Insulin Pen Needle (B-D ULTRAFINE III SHORT PEN) 31G X 8 MM MISC 1 each by Does not apply route as directed. 02/18/18   [name], MD  Multiple Vitamin (MULTIVITAMIN) capsule Take 1 capsule by mouth daily.    [provider]  sodium bicarbonate 650 MG tablet Take 1,300 mg by mouth 3 (three) times daily. 11/02/18   [provider]  Vitamin D, Ergocalciferol, (DRISDOL) 50000 units CAPS capsule Take 50,000 Units by mouth every 30 (thirty) days.    [provider]     Allergies:    No Known Allergies   Physical Exam:   Vitals  Blood pressure 103/61, pulse 76, temperature 98 F (36.7 C), temperature source Oral, resp. rate 16, height _0  (1.753 m), weight 97.1 kg, SpO2 99 %.   1. General alert male, laying in bed, no apparent distress  2. Normal affect and insight, Not Suicidal or Homicidal, Awake Alert, Oriented X 3.  3. No F.N deficits, ALL C.Nerves Intact, Strength 5/5 all 4 extremities, Sensation intact all 4 extremities, Plantars down going.  4. Ears and Eyes appear Normal, Conjunctivae clear, PERRLA. Moist Oral Mucosa.  5. Supple Neck, No JVD, No cervical lymphadenopathy appriciated, No Carotid Bruits.  6. Symmetrical Chest wall movement, Good air movement bilaterally, CTAB.  7. RRR, No Gallops, Rubs or Murmurs, No Parasternal Heave.  8. Positive Bowel Sounds, Abdomen Soft, No tenderness, No organomegaly appriciated,No rebound -guarding or  rigidity.  9.  No Cyanosis, Normal Skin Turgor, right foot plantar surface ulcer with purulent discharge, please see picture   10. Good muscle tone,  joints appear normal , no effusions, Normal ROM.  11. No Palpable Lymph Nodes in Neck or Axillaee   below.     Data Review:    CBC Last Labs      Recent Labs  Lab 01/12/20 1328  WBC 6.0  HGB 14.1  HCT 45.2  PLT 81*  MCV 82.8  MCH 25.8*  MCHC 31.2  RDW 14.1  LYMPHSABS 0.1*  MONOABS 0.8  EOSABS 0.0  BASOSABS 0.0     ------------------------------------------------------------------------------------------------------------------  Chemistries  Last Labs      Recent Labs  Lab 01/12/20 1328  NA 128*  K 4.7  CL 96*  CO2 21*  GLUCOSE 609*  BUN 55*  CREATININE 2.10*  CALCIUM 9.7     ------------------------------------------------------------------------------------------------------------------  estimated creatinine clearance is 43.6 mL/min (A) (by C-G formula based on SCr of 2.1 mg/dL (H)). ------------------------------------------------------------------------------------------------------------------  Recent Labs (last 2 labs)   No results for input(s): TSH, T4TOTAL, T3FREE, THYROIDAB in the last 72 hours.  Invalid input(s): FREET3    Coagulation profile Last Labs   No results for input(s): INR, PROTIME in the last 168 hours.   ------------------------------------------------------------------------------------------------------------------- Recent Labs (last 2 labs)   No results for input(s): DDIMER in the last 72 hours.   -------------------------------------------------------------------------------------------------------------------  Cardiac Enzymes  Last Labs   No results for input(s): CKMB, TROPONINI, MYOGLOBIN in the last 168 hours.  Invalid input(s): CK    ------------------------------------------------------------------------------------------------------------------ Labs (Brief)  No results found for: BNP     ---------------------------------------------------------------------------------------------------------------  Urinalysis Labs (Brief)          Component Value Date/Time   COLORURINE YELLOW (A) 09/17/2018 0910   APPEARANCEUR CLEAR (A) 09/17/2018 0910   LABSPEC 1.028 09/17/2018 0910   PHURINE 5.0 09/17/2018 0910   GLUCOSEU 150 (A) 09/17/2018 0910   HGBUR NEGATIVE 09/17/2018 0910   BILIRUBINUR negative 12/12/2019 1715   KETONESUR NEGATIVE 09/17/2018 0910   PROTEINUR Positive (A) 12/12/2019 1715   PROTEINUR 30 (A) 09/17/2018 0910   UROBILINOGEN 0.2 12/12/2019 1715   NITRITE negative 12/12/2019 1715   NITRITE NEGATIVE 09/17/2018 0910   LEUKOCYTESUR Negative 12/12/2019 1715   LEUKOCYTESUR NEGATIVE 09/17/2018 0910      ----------------------------------------------------------------------------------------------------------------   Imaging Results:     Imaging Results (Last 48 hours)  US Venous Img Lower Unilateral Right (DVT)  Result Date: 01/12/2020 CLINICAL DATA:  Calf and popliteal fossa pain x1 week, edema, redness EXAM: RIGHT LOWER EXTREMITY VENOUS DOPPLER ULTRASOUND TECHNIQUE: Gray-scale sonography with compression, as well as color and duplex ultrasound, were performed to evaluate the deep venous system(s) from the level of the common femoral vein through the popliteal and proximal calf veins. COMPARISON:  None. FINDINGS: VENOUS Normal compressibility of the common femoral, superficial femoral, and popliteal veins, as well as the visualized calf veins. Visualized portions of profunda femoral vein and great saphenous vein unremarkable. No filling defects to suggest DVT on grayscale or color Doppler imaging. Doppler waveforms show normal direction of venous flow, normal respiratory phasicity  and response to augmentation. Limited views of the contralateral common femoral vein are unremarkable. OTHER Hypoechoic thrombus in a noncompressible right short saphenous vein, and in the great saphenous vein below the knee. Hypoechoic right inguinal lymph node 1.1 cm short axis diameter. Limitations: none IMPRESSION: 1. Negative for DVT. 2. Superficial thrombophlebitis involving right short saphenous vein, and right GSV below the knee. Electronically Signed   By: Lucrezia Europe M.D.   On: 01/12/2020 16:11   DG Foot Complete Right  Result Date: 01/12/2020 CLINICAL DATA:  Diabetic foot infection. EXAM: RIGHT FOOT COMPLETE - 3+ VIEW COMPARISON:  April 05, 2019 FINDINGS: There is no evidence of an acute fracture or dislocation. A chronic fracture of the proximal phalanx of the fifth right toe is noted. There is a large plantar calcaneal spur. Moderate severity degenerative changes are seen involving the metatarsophalangeal joint of the right great toe. Moderate to marked severity vascular calcification is seen. A mild amount of soft tissue air is seen along the plantar aspect of the distal right foot. Mild to moderate severity associated soft tissue swelling is seen. IMPRESSION: 1. Mild amount of soft tissue air along the plantar aspect of the distal right foot. 2. Chronic and degenerative changes, as described above. Electronically Signed   By:  Virgina Norfolk M.D.   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 -If there is any abscess present on imaging, then will consult general surgery -We will obtain ABI  Elevated D-dimers -This is most likely related to thrombophlebitis as evident on vascular Doppler, no evidence for DVT, no indication for anticoagulation. -Obtain VQ scan to rule out PE even though low clinical suspicion, but D-dimer significantly elevated at [facility] at [facility] ED  Hyperglycemia, poorly controlled diabetes mellitus, insulin-dependent -Does not appear to be in DKA or HONC. -We will give him his his Lantus dose right now of 50 units, will start insulin sliding scale and IV fluids -Continue with home dose glipizide XL -Check A1c  AKI in a renal transplant patient -Continue with IV fluids, resume Prograf. -If no improvement with IV fluids then will obtain renal transplant ultrasound.  Hypertension -Continue with home medications  Thrombocytopenia -Most likely due to infection, monitor closely  DVT Prophylaxis subcu heparin  AM Labs Ordered, also please review Full Orders  Family Communication: Admission, patients condition and plan of care including tests being ordered have been discussed with the patient who indicate understanding and agree with the plan and Code Status.  Code Status Full  Likely DC to home  Condition GUARDED    Consults called:  none  Admission status:  inpatient  Time spent in minutes :60 minutes   Phillips Climes M.D on 01/12/2020 at 6:32 PM

## 2020-01-17 NOTE — TOC Initial Note (Signed)
Transition of Care [facility]) - Initial/Assessment Note    Patient Details  Name: Raymond [name] MRN: [id number] Date of Birth: 28-Aug-1960  [facility] CM/SW Contact:    Salome Arnt, [place] Phone Number: 01/17/2020, 3:16 PM  Clinical Narrative:   Pt admitted due to right foot diabetic ulcer and MRSA bacteremia. Pt from home with wife. Discussed pt will most likely require IV antibiotics at d/c. TEE today. Pt states he has done IV antibiotics before at home. Agreeable to referral to [facility]. LCSW spoke with Pam about referral who will work on Occupational hygienist as well. TOC will continue to follow.                  Expected Discharge Plan: [place] Barriers to Discharge: Continued Medical Work up   Patient Goals and CMS Choice Patient states their goals for this hospitalization and ongoing recovery are:: return home      Expected Discharge Plan and Services Expected Discharge Plan: [place] In-house Referral: Clinical Social Work   Post Acute Care Choice: [place] arrangements for the past 2 months: Single Family Home                           HH Arranged: RN, IV Antibiotics HH Agency: Other - See comment ([facility]) Date [place]: 01/17/20 Time HH Agency Contacted: 1515 Representative spoke with at [place]: [name]  Prior Living Arrangements/Services Living arrangements for the past 2 months: [place] with:: Spouse Patient language and need for interpreter reviewed:: Yes Do you feel safe going back to the place where you live?: Yes            Criminal Activity/Legal Involvement Pertinent to Current Situation/Hospitalization: No - Comment as needed  Activities of Daily Living Home Assistive Devices/Equipment: None ADL Screening (condition at time of admission) Patient's cognitive ability adequate to safely complete daily  activities?: Yes Is the patient deaf or have difficulty hearing?: No Does the patient have difficulty seeing, even when wearing glasses/contacts?: No Does the patient have difficulty concentrating, remembering, or making decisions?: Yes Patient able to express need for assistance with ADLs?: Yes Does the patient have difficulty dressing or bathing?: No Independently performs ADLs?: Yes (appropriate for developmental age) Does the patient have difficulty walking or climbing stairs?: No Weakness of Legs: None Weakness of Arms/Hands: Left  Permission Sought/Granted                  Emotional Assessment Appearance:: Appears stated age Attitude/Demeanor/Rapport: Engaged Affect (typically observed): Accepting Orientation: : Oriented to Self, Oriented to Place, Oriented to  Time, Oriented to Situation Alcohol / Substance Use: Not Applicable Psych Involvement: No (comment)  Admission diagnosis:  Infected wound [T14.8XXA, L08.9] Hyperglycemia [R73.9] Right leg pain [M79.604] Diabetic foot infection ([place]) [N82.956, L08.9] Thrombophlebitis of superficial veins of right lower extremity [I80.01] Patient Active Problem List   Diagnosis Date Noted  . Diabetic foot infection ([place])   . Infected wound 01/12/2020  . Critical lower limb ischemia 02/02/2019  . Personal history of noncompliance with medical treatment, presenting hazards to health 10/07/2018  . Critical limb ischemia with history of revascularization of same extremity 07/27/2018  . Mixed hyperlipidemia 02/19/2018  . Class 1 obesity due to excess calories with serious comorbidity and body mass index (BMI) of 31.0 to 31.9 in adult 02/19/2018  .  History of renal transplant 02/19/2018  . Bilateral impacted cerumen 08/31/2017  . Conductive hearing loss, bilateral 08/31/2017  . Nuclear sclerotic cataract of right eye 10/07/2016  . Pseudophakia of left eye 10/07/2016  . Hypertension due to endocrine disorder 03/04/2016  .  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 Renal failure 03/04/2016  . Acute kidney injury ([place]) 12/10/2015  . Immunosuppression ([place]) 12/10/2015  . Nausea vomiting and diarrhea 12/10/2015  . 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 Essential hypertension, benign 12/10/2015  . ARF (acute renal failure) ([place]) 12/10/2015  . Pancytopenia ([place]) 12/10/2015  . Encounter for screening colonoscopy 11/15/2014  . GERD (gastroesophageal reflux disease) 11/15/2014   PCP:  [name], FNP Pharmacy:   [facility] [address] [place] [address] [place] [address] Phone: [phone] Fax: [phone]  [facility] [place], [place] [address] [place] [address] Phone: [phone] Fax: [phone]     Social Determinants of Health (SDOH) Interventions    Readmission Risk Interventions No flowsheet data found.

## 2020-01-17 NOTE — Progress Notes (Signed)
  Subjective: Right lower leg pain being well controlled with Percocet.  Objective: Vital signs in last 24 hours: Temp:  [98.3 F (36.8 C)-98.6 F (37 C)] 98.6 F (37 C) (08/24 0532) Pulse Rate:  [61-72] 72 (08/24 0532) Resp:  [16-18] 16 (08/24 0532) BP: (152-162)/(80-91) 162/91 (08/24 0532) SpO2:  [99 %-100 %] 100 % (08/24 0532) Last BM Date: 01/15/20  Intake/Output from previous day: No intake/output data recorded. Intake/Output this shift: No intake/output data recorded.  General appearance: alert, cooperative and no distress Extremities: Minimal right foot swelling.  Minimal serosanguineous drainage from plantar wound in the right foot.  No ascending cellulitis.  Lab Results:  Recent Labs    01/17/20 0545  WBC 6.1  HGB 12.6*  HCT 42.3  PLT 144*   BMET Recent Labs    01/17/20 0545  NA 138  K 4.1  CL 111  CO2 19*  GLUCOSE 240*  BUN 23*  CREATININE 1.05  CALCIUM 8.9   PT/INR No results for input(s): LABPROT, INR in the last 72 hours.  Studies/Results: No results found.  Anti-infectives: Anti-infectives (From admission, onward)   Start     Dose/Rate Route Frequency Ordered Stop   01/13/20 2200  DAPTOmycin (CUBICIN) 650 mg in sodium chloride 0.9 % IVPB        650 mg 226 mL/hr over 30 Minutes Intravenous Every 24 hours 01/13/20 2032     01/12/20 2000  DAPTOmycin (CUBICIN) 650 mg in sodium chloride 0.9 % IVPB  Status:  Discontinued        650 mg 226 mL/hr over 30 Minutes Intravenous Daily 01/12/20 1828 01/13/20 2000   01/12/20 1830  metroNIDAZOLE (FLAGYL) IVPB 500 mg  Status:  Discontinued       "And" Linked Group Details   500 mg 100 mL/hr over 60 Minutes Intravenous Every 8 hours 01/12/20 1743 01/13/20 2000   01/12/20 1815  cefTRIAXone (ROCEPHIN) 2 g in sodium chloride 0.9 % 100 mL IVPB  Status:  Discontinued       "And" Linked Group Details   2 g 200 mL/hr over 30 Minutes Intravenous Every 24 hours 01/12/20 1743 01/13/20 2000   01/12/20 1730   vancomycin (VANCOCIN) IVPB 1000 mg/200 mL premix        1,000 mg 200 mL/hr over 60 Minutes Intravenous  Once 01/12/20 1719 01/12/20 1856      Assessment/Plan: s/p Procedure(s): TRANSESOPHAGEAL ECHOCARDIOGRAM (TEE) WITH PROPOFOL Impression: Diabetic right foot cellulitis/abscess with bacteremia.  For TEE today.  Okay for discharge with PICC line and home IV antibiotics from the surgery standpoint.  Patient should follow-up with podiatry in [place].  LOS: 5 days    Aviva Signs 01/17/2020

## 2020-01-17 NOTE — Transfer of Care (Signed)
Immediate Anesthesia Transfer of Care Note  Patient: Raymond [name]  Procedure(s) Performed: TRANSESOPHAGEAL ECHOCARDIOGRAM (TEE) WITH PROPOFOL (N/A )  Patient Location: Endoscopy Unit  Anesthesia Type:General  Level of Consciousness: awake, alert , oriented and patient cooperative  Airway & Oxygen Therapy: Patient Spontanous Breathing and Patient connected to nasal cannula oxygen  Post-op Assessment: Report given to RN, Post -op Vital signs reviewed and stable and Patient moving all extremities  Post vital signs: Reviewed and stable  Last Vitals:  Vitals Value Taken Time  BP    Temp    Pulse    Resp    SpO2      Last Pain:  Vitals:   01/17/20 1020  TempSrc: Oral  PainSc: 0-No pain      Patients Stated Pain Goal: 0 (90/30/09 2330)  Complications: No complications documented.

## 2020-01-18 ENCOUNTER — Encounter (HOSPITAL_COMMUNITY): Payer: Self-pay | Admitting: Cardiology

## 2020-01-18 ENCOUNTER — Inpatient Hospital Stay: Payer: Self-pay

## 2020-01-18 DIAGNOSIS — R7881 Bacteremia: Secondary | ICD-10-CM

## 2020-01-18 DIAGNOSIS — Z789 Other specified health status: Secondary | ICD-10-CM

## 2020-01-18 DIAGNOSIS — B9562 Methicillin resistant Staphylococcus aureus infection as the cause of diseases classified elsewhere: Secondary | ICD-10-CM

## 2020-01-18 DIAGNOSIS — D849 Immunodeficiency, unspecified: Secondary | ICD-10-CM

## 2020-01-18 LAB — CULTURE, BLOOD (ROUTINE X 2)
Culture: NO GROWTH
Culture: NO GROWTH
Special Requests: ADEQUATE
Special Requests: ADEQUATE

## 2020-01-18 LAB — GLUCOSE, CAPILLARY
Glucose-Capillary: 156 mg/dL — ABNORMAL HIGH (ref 70–99)
Glucose-Capillary: 110 mg/dL — ABNORMAL HIGH (ref 70–99)
Glucose-Capillary: 123 mg/dL — ABNORMAL HIGH (ref 70–99)
Glucose-Capillary: 145 mg/dL — ABNORMAL HIGH (ref 70–99)
Glucose-Capillary: 123 mg/dL — ABNORMAL HIGH (ref 70–99)
Glucose-Capillary: 150 mg/dL — ABNORMAL HIGH (ref 70–99)

## 2020-01-18 MED ORDER — HEPARIN SODIUM (PORCINE) 5000 UNIT/ML IJ SOLN
5000.0000 [IU] | Freq: Three times a day (TID) | INTRAMUSCULAR | Status: AC
  Administered 2020-01-18 – 2020-01-19 (×4): 5000 [IU] via SUBCUTANEOUS
  Filled 2020-01-18 (×4): qty 1

## 2020-01-18 NOTE — Progress Notes (Signed)
1 Day Post-Op  Subjective: No significant pain in the right foot.  Objective: Vital signs in last 24 hours: Temp:  [97.7 F (36.5 C)-98.7 F (37.1 C)] 98.2 F (36.8 C) (08/25 0545) Pulse Rate:  [55-65] 65 (08/25 0545) Resp:  [16-26] 18 (08/25 0545) BP: (86-156)/(56-91) 156/85 (08/25 0545) SpO2:  [93 %-100 %] 99 % (08/25 0545) Last BM Date: 01/15/20  Intake/Output from previous day: 08/24 0701 - 08/25 0700 In: 600 [I.V.:600] Out: -  Intake/Output this shift: No intake/output data recorded.  General appearance: alert, cooperative and no distress Extremities: Right foot dressing intact  Lab Results:  Recent Labs    01/17/20 0545  WBC 6.1  HGB 12.6*  HCT 42.3  PLT 144*   BMET Recent Labs    01/17/20 0545  NA 138  K 4.1  CL 111  CO2 19*  GLUCOSE 240*  BUN 23*  CREATININE 1.05  CALCIUM 8.9   PT/INR No results for input(s): LABPROT, INR in the last 72 hours.  Studies/Results: ECHO TEE  Result Date: 01/17/2020    TRANSESOPHOGEAL ECHO REPORT   Patient Name:   Raymond [name] Date of Exam: 01/17/2020 Medical Rec #:  [id number]       Height:       69.0 in Accession #:    [id number]      Weight:       214.0 lb Date of Birth:  1961-04-27       BSA:          2.126 m Patient Age:    59 years        BP:           115/67 mmHg Patient Gender: M               HR:           55 bpm. Exam Location:  [facility] Procedure: Transesophageal Echo Indications:    Bacteremia 790.7 / R78.81  History:        Patient has prior history of Echocardiogram examinations, most                 recent 01/14/2020. Risk Factors:Diabetes, Hypertension,                 Dyslipidemia and Non-Smoker. Immunosuppression , GERD.  Sonographer:    Leavy Cella RDCS (AE) Referring Phys: [id number] Alphonse Guild BRANCH PROCEDURE: The transesophogeal probe was passed without difficulty through the esophogus of the patient. Sedation performed by performing physician. The patient was monitored while under deep sedation.  Anesthestetic sedation was provided intravenously by  Anesthesiology: 252mg  of Propofol. The patient developed no complications during the procedure. IMPRESSIONS  1. Left ventricular ejection fraction, by estimation, is 55 to 60%. The left ventricle has normal function. The left ventricle has no regional wall motion abnormalities.  2. Right ventricular systolic function is normal. The right ventricular size is normal.  3. Left atrial size was mildly dilated. No left atrial/left atrial appendage thrombus was detected. The LAA emptying velocity was 90 cm/s.  4. Right atrial size was mildly dilated.  5. The mitral valve is normal in structure. Trivial mitral valve regurgitation. No evidence of mitral stenosis.  6. The aortic valve is tricuspid. Aortic valve regurgitation is not visualized. No aortic stenosis is present. Conclusion(s)/Recommendation(s): No evidence of vegetation/infective endocarditis on this transesophageal echocardiogram. FINDINGS  Left Ventricle: Left ventricular ejection fraction, by estimation, is 55 to 60%. The left ventricle has normal function. The left ventricle has  no regional wall motion abnormalities. The left ventricular internal cavity size was normal in size. There is  no left ventricular hypertrophy. Right Ventricle: The right ventricular size is normal. No increase in right ventricular wall thickness. Right ventricular systolic function is normal. Left Atrium: Left atrial size was mildly dilated. No left atrial/left atrial appendage thrombus was detected. The LAA emptying velocity was 90 cm/s. Right Atrium: Right atrial size was mildly dilated. Pericardium: There is no evidence of pericardial effusion. Mitral Valve: The mitral valve is normal in structure. Trivial mitral valve regurgitation. No evidence of mitral valve stenosis. Tricuspid Valve: The tricuspid valve is normal in structure. Tricuspid valve regurgitation is trivial. No evidence of tricuspid stenosis. Aortic Valve: The  aortic valve is tricuspid. Aortic valve regurgitation is not visualized. No aortic stenosis is present. Pulmonic Valve: The pulmonic valve was not well visualized. Pulmonic valve regurgitation is not visualized. No evidence of pulmonic stenosis. Aorta: The aortic root is normal in size and structure. IAS/Shunts: No atrial level shunt detected by color flow Doppler. [name] MD Electronically signed by [name] MD Signature Date/Time: 01/17/2020/12:21:15 PM    Final     Anti-infectives: Anti-infectives (From admission, onward)   Start     Dose/Rate Route Frequency Ordered Stop   01/13/20 2200  DAPTOmycin (CUBICIN) 650 mg in sodium chloride 0.9 % IVPB        650 mg 226 mL/hr over 30 Minutes Intravenous Every 24 hours 01/13/20 2032     01/12/20 2000  DAPTOmycin (CUBICIN) 650 mg in sodium chloride 0.9 % IVPB  Status:  Discontinued        650 mg 226 mL/hr over 30 Minutes Intravenous Daily 01/12/20 1828 01/13/20 2000   01/12/20 1830  metroNIDAZOLE (FLAGYL) IVPB 500 mg  Status:  Discontinued       "And" Linked Group Details   500 mg 100 mL/hr over 60 Minutes Intravenous Every 8 hours 01/12/20 1743 01/13/20 2000   01/12/20 1815  cefTRIAXone (ROCEPHIN) 2 g in sodium chloride 0.9 % 100 mL IVPB  Status:  Discontinued       "And" Linked Group Details   2 g 200 mL/hr over 30 Minutes Intravenous Every 24 hours 01/12/20 1743 01/13/20 2000   01/12/20 1730  vancomycin (VANCOCIN) IVPB 1000 mg/200 mL premix        1,000 mg 200 mL/hr over 60 Minutes Intravenous  Once 01/12/20 1719 01/12/20 1856      Assessment/Plan: s/p Procedure(s): TRANSESOPHAGEAL ECHOCARDIOGRAM (TEE) WITH PROPOFOL Impression: Right foot abscess almost resolved. MRSA bacteremia. Blood cultures done on 822 2021 showed no growth. TEE appears unremarkable for vegetations. Okay for discharge from surgery standpoint with follow-up with Dr. Earleen Newport of podiatry. Patient should continue foot soaks daily.  LOS: 6 days    Aviva Signs 01/18/2020

## 2020-01-18 NOTE — Progress Notes (Signed)
PROGRESS NOTE  Raymond [name] DOB: 05/06/1961 DOA: 01/12/2020 PCP: [name], FNP  Brief History:  As per H&P written by Dr. 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, it was sent by podiatry for evaluation for wound in the right plantar foot, it was seen today by podiatry, and he had a concern of worsening foot infection, so he was sent to ED for further evaluation, patient reports symptoms has been going on for last 2 weeks, and currently he is with purulent foul-smelling odor, as well there was a concern for possible DVT in his right lower extremity given swelling, patient was at [facility] ED yesterday, and he left prior to getting his imaging study done, labs during that visit showing elevated D-dimers greater than 19, patient himself denies any chest pain, palpitation, dizziness, he does report mild dyspnea, report he has been compliant with his Lantus and glipizide. - in [place] significant for pseudohyponatremia sodium of 128, creatinine of 2.1 from baseline 1.3, glucose of 609, but normal anion gap, and bicarb of 21, had low platelet at 81, negative, he is unvaccinated, started on vancomycin, [facility] hospitalist consulted to admit.  Assessment/Plan: right diabetic foot ulcer/MRSA bacteremia:  -presented Open wound with purulent discharge -Present for the last 2 weeks and worsening -had 8 weeks IV daptomycin 02/2019 to 04/2019 -underlying history immunosuppressant due to renal transplant status.   -Continue current antibiotics, on Daptomycin. -Repeat blood cultures--neg x 5 days -ID has recommended to check TEE; discussed with cardiology service and will be done later today 01/17/20. -MRI suggesting developing abscess and osteonecrosis; general surgery has been  consulted -case discussed with general surgery-->feels source control obtained as drainage is improved -really needs ray amputation, but patient does not want amputation -Continue wound care and soaking bath with Betadine, no surgical intervention currently.  -01/17/20--TEE--neg for vegetation (EF 55-60%), No WMA -Tunneled central line schedule for 01/20/20 by IR -check ESR/CRP  Acute on chronic renal failure--CKD3a -s/p renal transplant -baseline creatinine 1.0-1.3 -Continue minimizing nephrotoxic agents -Follow renal function trend -Gentle fluid resuscitation provided-->creatinine back to baseline -Continue Prograf.  Immunosuppression -Continue Prograf  -History of renal transplant.  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 -Follow CBGs--controlled -Close follow-up after discharge with patient's endocrinologist has been recommended.  Essential hypertension, benign -Stable overall -Continue current antihypertensive regimen. -Follow vital signs.  Class 1 obesity due to excess calories -body mass index (BMI) of 31.0 to 31.9 in adult -Low calorie diet, portion control and increase physical activity discussed with patient -Body mass index is 31.6 kg/m.  elevated D-dimer/right lower extremity superficial thrombophlebitis -No DVT and SVT not close to femoral vein, for that no anticoagulation required -VQ scan also negative for concerns of pulmonary embolism. -Continue supportive care, TED hoses usage and 130m of Aspirin.  -continue PRN analgesics.        Status is: Inpatient  Remains inpatient appropriate because:IV treatments appropriate due to intensity of illness or inability to take PO   Dispo: The patient is from: Home              Anticipated d/c is to: Home              Anticipated d/c date is: 2 days  Patient currently  is not medically stable to d/c.        Family Communication:   Family at bedside  Consultants:  IR  Code Status:  FULL   DVT Prophylaxis:  [place] Heparin    Procedures: As Listed in Progress Note Above  Antibiotics: daptomycin 8/19>>>     Subjective: Patient states foot pain and swelling slowly improving.  Denies f/c, cp, n/v/d, abd pain  Objective: Vitals:   01/17/20 1154 01/17/20 2046 01/17/20 2135 01/18/20 0545  BP: 115/67  (!) 150/91 (!) 156/85  Pulse: (!) 55  65 65  Resp: (!) '22  16 18  ' Temp:   98.7 F (37.1 C) 98.2 F (36.8 C)  TempSrc:   Oral Oral  SpO2: 96% 97% 100% 99%  Weight:      Height:       No intake or output data in the 24 hours ending 01/18/20 1638 Weight change:  Exam:   General:  Pt is alert, follows commands appropriately, not in acute distress  HEENT: No icterus, No thrush, No neck mass, [place]/AT  Cardiovascular: RRR, S1/S2, no rubs, no gallops  Respiratory: CTA bilaterally, no wheezing, no crackles, no rhonchi  Abdomen: Soft/+BS, non tender, non distended, no guarding  Extremities: right foot plantar ulcer with serosanguinous drainage.  Mild edema.  No crepitance or necrosis.     Data Reviewed: I have personally reviewed following labs and imaging studies Basic Metabolic Panel: Recent Labs  Lab 01/12/20 1328 01/13/20 0722 01/17/20 0545  NA 128* 136 138  K 4.7 3.8 4.1  CL 96* 109 111  CO2 21* 18* 19*  GLUCOSE 609* 166* 240*  BUN 55* 60* 23*  CREATININE 2.10* 1.71* 1.05  CALCIUM 9.7 8.4* 8.9   Liver Function Tests: Recent Labs  Lab 01/13/20 0722  AST 15  ALT 17  ALKPHOS 97  BILITOT 0.8  PROT 6.1*  ALBUMIN 2.5*   No results for input(s): LIPASE, AMYLASE in the last 168 hours. No results for input(s): AMMONIA in the last 168 hours. Coagulation Profile: No results for input(s): INR, PROTIME in the last 168 hours. CBC: Recent Labs  Lab 01/12/20 1328 01/13/20 0722 01/17/20 0545  WBC 6.0 8.4 6.1  NEUTROABS 5.0   --   --   HGB 14.1 11.3* 12.6*  HCT 45.2 37.0* 42.3  MCV 82.8 83.5 84.4  PLT 81* 66* 144*   Cardiac Enzymes: Recent Labs  Lab 01/13/20 0722  CKTOTAL 30*   BNP: Invalid input(s): POCBNP CBG: Recent Labs  Lab 01/17/20 1701 01/17/20 2136 01/17/20 2240 01/18/20 0748 01/18/20 1131  GLUCAP 151* 156* 110* 123* 145*   HbA1C: No results for input(s): HGBA1C in the last 72 hours. Urine analysis:    Component Value Date/Time   COLORURINE YELLOW (A) 09/17/2018 0910   APPEARANCEUR CLEAR (A) 09/17/2018 0910   LABSPEC 1.028 09/17/2018 0910   PHURINE 5.0 09/17/2018 0910   GLUCOSEU 150 (A) 09/17/2018 0910   HGBUR NEGATIVE 09/17/2018 0910   BILIRUBINUR negative 12/12/2019 1715   KETONESUR NEGATIVE 09/17/2018 0910   PROTEINUR Positive (A) 12/12/2019 1715   PROTEINUR 30 (A) 09/17/2018 0910   UROBILINOGEN 0.2 12/12/2019 1715   NITRITE negative 12/12/2019 1715   NITRITE NEGATIVE 09/17/2018 0910   LEUKOCYTESUR Negative 12/12/2019 1715   LEUKOCYTESUR NEGATIVE 09/17/2018 0910   Sepsis Labs: '@LABRCNTIP' (procalcitonin:4,lacticidven:4) ) Recent Results (from the past 240 hour(s))  WOUND CULTURE     Status: Abnormal   Collection Time: 01/12/20  2:12 PM  Result Value Ref  Range Status   MICRO NUMBER: [id number]  Final   SPECIMEN QUALITY: Adequate  Final   SOURCE: NOT GIVEN  Final   STATUS: FINAL  Final   GRAM STAIN:   Final    Rare White blood cells seen No epithelial cells seen Many Gram positive cocci in pairs Few Gram positive bacilli   ISOLATE 1: methicillin resistant Staphylococcus aureus (A)  Final    Comment: Heavy growth of Methicillin resistant Staphylococcus aureus (MRSA) Negative for inducible clindamycin resistance.      Susceptibility   Methicillin resistant staphylococcus aureus - AEROBIC CULT, GRAM STAIN POSITIVE 1    VANCOMYCIN 1 Sensitive     CIPROFLOXACIN >=8 Resistant     CLINDAMYCIN <=0.25 Sensitive     LEVOFLOXACIN 4 Resistant     ERYTHROMYCIN >=8 Resistant      GENTAMICIN <=0.5 Sensitive     OXACILLIN >=4 Resistant     TETRACYCLINE >=16 Resistant     TRIMETH/SULFA* <=10 Sensitive      * Legend:S = Susceptible  I = IntermediateR = Resistant  NS = Not susceptible* = Not tested  NR = Not reported**NN = See antimicrobic comments  SARS Coronavirus 2 by RT PCR (hospital order, performed in [place] hospital lab) Nasopharyngeal Nasopharyngeal Swab     Status: None   Collection Time: 01/12/20  4:06 PM   Specimen: Nasopharyngeal Swab  Result Value Ref Range Status   SARS Coronavirus 2 NEGATIVE NEGATIVE Final    Comment: (NOTE) SARS-CoV-2 target nucleic acids are NOT DETECTED.  The SARS-CoV-2 RNA is generally detectable in upper and lower respiratory specimens during the acute phase of infection. The lowest concentration of SARS-CoV-2 viral copies this assay can detect is 250 copies / mL. A negative result does not preclude SARS-CoV-2 infection and should not be used as the sole basis for treatment or other patient management decisions.  A negative result may occur with improper specimen collection / handling, submission of specimen other than nasopharyngeal swab, presence of viral mutation(s) within the areas targeted by this assay, and inadequate number of viral copies (<250 copies / mL). A negative result must be combined with clinical observations, patient history, and epidemiological information.  Fact Sheet for Patients:   StrictlyIdeas.no  Fact Sheet for Healthcare Providers: BankingDealers.co.za  This test is not yet approved or  cleared by the [place] FDA and has been authorized for detection and/or diagnosis of SARS-CoV-2 by FDA under an Emergency Use Authorization (EUA).  This EUA will remain in effect (meaning this test can be used) for the duration of the COVID-19 declaration under Section 564(b)(1) of the Act, 21 U.S.C. section 360bbb-3(b)(1), unless the authorization is  terminated or revoked sooner.  Performed at [facility], [address] [place], [place] [address]   Culture, blood (routine x 2)     Status: Abnormal   Collection Time: 01/12/20  6:16 PM   Specimen: BLOOD RIGHT HAND  Result Value Ref Range Status   Specimen Description   Final    BLOOD RIGHT HAND Performed at [facility], [address], [place], [place] [address]    Special Requests   Final    BOTTLES DRAWN AEROBIC AND ANAEROBIC Blood Culture adequate volume Performed at [facility], [address] [place], [place] [address]    Culture  Setup Time   Final    ANAEROBIC and AEROBIC BOTTLE GRAM POSITIVE COCCI Gram Stain Report Called to,Read Back By and Verified With: DEAN,T'@1103'  BY MATTHEWS, B 8.20.2021 [place]  [facility]OS[facility] Organism ID to follow CRITICAL RESULT CALLED TO, READ BACK BY AND VERIFIED WITH: A,MUHAMMAD RN '@1947'  01/13/20 EB    Culture (A)  Final    METHICILLIN RESISTANT STAPHYLOCOCCUS AUREUS ENTEROCOCCUS FAECALIS CRITICAL RESULT CALLED TO, READ BACK BY AND VERIFIED WITH: Tally Due [id number] FCP Performed at [facility], [place] [address] [place], [place] [address]    Report Status 01/15/2020 FINAL  Final   Organism ID, Bacteria METHICILLIN RESISTANT STAPHYLOCOCCUS AUREUS  Final   Organism ID, Bacteria ENTEROCOCCUS FAECALIS  Final      Susceptibility   Enterococcus faecalis - MIC*    AMPICILLIN <=2 SENSITIVE Sensitive     VANCOMYCIN 1 SENSITIVE Sensitive     GENTAMICIN SYNERGY SENSITIVE Sensitive     * ENTEROCOCCUS FAECALIS   Methicillin resistant staphylococcus aureus - MIC*    CIPROFLOXACIN >=8 RESISTANT Resistant     ERYTHROMYCIN >=8 RESISTANT Resistant     GENTAMICIN <=0.5 SENSITIVE Sensitive     OXACILLIN >=4 RESISTANT Resistant     TETRACYCLINE >=16 RESISTANT Resistant     VANCOMYCIN <=0.5 SENSITIVE Sensitive     TRIMETH/SULFA <=10 SENSITIVE Sensitive     CLINDAMYCIN <=0.25 SENSITIVE Sensitive     RIFAMPIN <=0.5 SENSITIVE Sensitive      Inducible Clindamycin NEGATIVE Sensitive     * METHICILLIN RESISTANT STAPHYLOCOCCUS AUREUS  Culture, blood (routine x 2)     Status: Abnormal   Collection Time: 01/12/20  6:16 PM   Specimen: BLOOD RIGHT HAND  Result Value Ref Range Status   Specimen Description   Final    BLOOD RIGHT HAND Performed at [facility], [address] [place], [place] [address]    Special Requests   Final    BOTTLES DRAWN AEROBIC AND ANAEROBIC Blood Culture adequate volume Performed at [facility], [address] [place], [place] [address]    Culture  Setup Time   Final    ANAEROBIC AND AEROBIC BOTTLES GRAM POSITIVE COCCI Gram Stain Report Called to,Read Back By and Verified With: DEAN,T'@1103'  BY MATTHEWS, B 8.20.21 [place] CRITICAL VALUE NOTED.  VALUE IS CONSISTENT WITH PREVIOUSLY REPORTED AND CALLED VALUE.    Culture (A)  Final    STAPHYLOCOCCUS AUREUS ENTEROCOCCUS FAECALIS SUSCEPTIBILITIES PERFORMED ON PREVIOUS CULTURE WITHIN THE LAST 5 DAYS. Performed at [facility], [place] [address] [place], [place] [address]    Report Status 01/15/2020 FINAL  Final  Blood Culture ID Panel (Reflexed)     Status: Abnormal   Collection Time: 01/12/20  6:16 PM  Result Value Ref Range Status   Enterococcus faecalis NOT DETECTED NOT DETECTED Final   Enterococcus Faecium NOT DETECTED NOT DETECTED Final   Listeria monocytogenes NOT DETECTED NOT DETECTED Final   Staphylococcus species DETECTED (A) NOT DETECTED Final    Comment: RESULT CALLED TO, READ BACK BY AND VERIFIED WITH: A,MUHAMMAD RN '@1947'  01/13/20 EB    Staphylococcus aureus (BCID) DETECTED (A) NOT DETECTED Final    Comment: Methicillin (oxacillin)-resistant Staphylococcus aureus (MRSA). MRSA is predictably resistant to beta-lactam antibiotics (except ceftaroline). Preferred therapy is vancomycin unless clinically contraindicated. Patient requires contact precautions if  hospitalized. RESULT CALLED TO, READ BACK BY AND VERIFIED  WITH: A,MUHAMMAD RN '@1947'  01/13/20 EB    Staphylococcus epidermidis NOT DETECTED NOT DETECTED Final   Staphylococcus lugdunensis NOT DETECTED NOT DETECTED Final   Streptococcus species NOT DETECTED NOT DETECTED Final   Streptococcus agalactiae NOT DETECTED NOT DETECTED Final   Streptococcus pneumoniae NOT DETECTED NOT DETECTED  Final   Streptococcus pyogenes NOT DETECTED NOT DETECTED Final   A.calcoaceticus-baumannii NOT DETECTED NOT DETECTED Final   Bacteroides fragilis NOT DETECTED NOT DETECTED Final   Enterobacterales NOT DETECTED NOT DETECTED Final   Enterobacter cloacae complex NOT DETECTED NOT DETECTED Final   Escherichia coli NOT DETECTED NOT DETECTED Final   Klebsiella aerogenes NOT DETECTED NOT DETECTED Final   Klebsiella oxytoca NOT DETECTED NOT DETECTED Final   Klebsiella pneumoniae NOT DETECTED NOT DETECTED Final   Proteus species NOT DETECTED NOT DETECTED Final   Salmonella species NOT DETECTED NOT DETECTED Final   Serratia marcescens NOT DETECTED NOT DETECTED Final   Haemophilus influenzae NOT DETECTED NOT DETECTED Final   Neisseria meningitidis NOT DETECTED NOT DETECTED Final   Pseudomonas aeruginosa NOT DETECTED NOT DETECTED Final   Stenotrophomonas maltophilia NOT DETECTED NOT DETECTED Final   Candida albicans NOT DETECTED NOT DETECTED Final   Candida auris NOT DETECTED NOT DETECTED Final   Candida glabrata NOT DETECTED NOT DETECTED Final   Candida krusei NOT DETECTED NOT DETECTED Final   Candida parapsilosis NOT DETECTED NOT DETECTED Final   Candida tropicalis NOT DETECTED NOT DETECTED Final   Cryptococcus neoformans/gattii NOT DETECTED NOT DETECTED Final   Meth resistant mecA/C and MREJ DETECTED (A) NOT DETECTED Final    Comment: RESULT CALLED TO, READ BACK BY AND VERIFIED WITH: A,MUHAMMAD RN '@1947'  01/13/20 EB Performed at [facility], [address], [place], [place] [address]   Culture, blood (Routine X 2) w Reflex to ID Panel     Status: None    Collection Time: 01/13/20  9:04 PM   Specimen: BLOOD RIGHT HAND  Result Value Ref Range Status   Specimen Description BLOOD RIGHT HAND  Final   Special Requests   Final    BOTTLES DRAWN AEROBIC AND ANAEROBIC Blood Culture adequate volume   Culture   Final    NO GROWTH 5 DAYS Performed at [facility], [address], [place], [place] [address]    Report Status 01/18/2020 FINAL  Final  Culture, blood (Routine X 2) w Reflex to ID Panel     Status: None   Collection Time: 01/13/20  9:04 PM   Specimen: BLOOD RIGHT HAND  Result Value Ref Range Status   Specimen Description BLOOD RIGHT HAND  Final   Special Requests   Final    BOTTLES DRAWN AEROBIC AND ANAEROBIC Blood Culture adequate volume   Culture   Final    NO GROWTH 5 DAYS Performed at [facility], [address], [place], [place] [address]    Report Status 01/18/2020 FINAL  Final  Culture, blood (Routine X 2) w Reflex to ID Panel     Status: None (Preliminary result)   Collection Time: 01/15/20  1:46 PM   Specimen: BLOOD RIGHT HAND  Result Value Ref Range Status   Specimen Description   Final    BLOOD RIGHT HAND BOTTLES DRAWN AEROBIC AND ANAEROBIC   Special Requests Blood Culture adequate volume  Final   Culture   Final    NO GROWTH 3 DAYS Performed at [facility], [address] [place], [place] [address]    Report Status PENDING  Incomplete  Culture, blood (Routine X 2) w Reflex to ID Panel     Status: None (Preliminary result)   Collection Time: 01/15/20  1:46 PM   Specimen: Right Antecubital; Blood  Result Value Ref Range Status   Specimen Description   Final    RIGHT ANTECUBITAL BOTTLES DRAWN  AEROBIC AND ANAEROBIC   Special Requests Blood Culture adequate volume  Final   Culture   Final    NO GROWTH 3 DAYS Performed at [facility], [address], [place], [place] [address]    Report Status PENDING  Incomplete     Scheduled Meds: . amLODipine  5 mg Oral Daily  . aspirin EC  162 mg Oral Daily  . doxazosin  4  mg Oral Daily  . heparin  5,000 Units Subcutaneous Q8H  . hydrALAZINE  25 mg Oral BID  . insulin aspart  0-15 Units Subcutaneous TID WC  . insulin aspart  0-5 Units Subcutaneous QHS  . insulin aspart  4 Units Subcutaneous TID WC  . insulin glargine  30 Units Subcutaneous BID  . multivitamin  1 tablet Oral Daily  . pantoprazole  40 mg Oral Daily  . tacrolimus  3 mg Oral BID  . Vitamin D (Ergocalciferol)  50,000 Units Oral Q30 days   Continuous Infusions: . sodium chloride 75 mL/hr at 01/17/20 2247  . sodium chloride 75 mL/hr at 01/16/20 0130  . DAPTOmycin (CUBICIN)  IV 650 mg (01/17/20 2249)    Procedures/Studies: NM Pulmonary Perfusion  Result Date: 01/13/2020 CLINICAL DATA:  Chest pain and shortness of breath with positive D-dimer EXAM: NUCLEAR MEDICINE PERFUSION LUNG SCAN TECHNIQUE: Perfusion images were obtained in multiple projections after intravenous injection of radiopharmaceutical. Views: Anterior, posterior, left lateral, right lateral, RPO, LPO, RAO, LAO RADIOPHARMACEUTICALS:  3.8 mCi Tc-74mMAA IV COMPARISON:  None. FINDINGS: Radiotracer uptake is homogeneous and symmetric bilaterally. No perfusion defects evident. IMPRESSION: No evident perfusion defects. No findings indicative of pulmonary embolus. Electronically Signed   By: WLowella GripIII M.D.   On: 01/13/2020 08:46   MR FOOT RIGHT WO CONTRAST  Result Date: 01/13/2020 CLINICAL DATA:  Diabetic foot ulcer EXAM: MRI OF THE RIGHT FOREFOOT WITHOUT CONTRAST TECHNIQUE: Multiplanar, multisequence MR imaging of the right foot was performed. No intravenous contrast was administered. COMPARISON:  X-ray 01/12/2020, 03/01/2018 FINDINGS: Bones/Joint/Cartilage Chronic changes to the first metatarsal head with subchondral collapse and subchondral cystic change. There is reactive sclerosis and subchondral cystic change of the opposing great toe proximal phalanx base, more pronounced along the medial aspect. Small first MTP joint  effusion. The medial hallux sesamoid also appears sclerotic with heterogeneous T2 hyperintense appearance which may reflect sequela of chronic sesamoiditis. Mild degenerative changes within the midfoot including at the second metatarsal base and within the lateral cuneiform. No acute fracture. No dislocation. No bony erosion or cortical destruction. Ligaments Intact Lisfranc ligament. Collateral ligaments of the forefoot appear intact. Muscles and Tendons Fatty atrophy of the intrinsic foot musculature suggesting chronic denervation changes. Intact flexor and extensor tendons without tenosynovitis. Soft tissues Plantar soft tissue ulceration underlies the medial forefoot at the level of the first MTP joint. Ulcer base is contiguous with a fluid and air containing collection measuring approximately 2.1 x 0.8 x 1.8 cm (series 9, image 20; series 5, image 16) suggesting a developing abscess. IMPRESSION: 1. Plantar soft tissue ulceration underlies the medial forefoot at the level of the first MTP joint. Ulcer base is contiguous with a fluid and air containing collection measuring 2.1 x 0.8 x 1.8 cm suggesting a developing abscess. 2. Chronic bony changes to the first metatarsal head and great toe proximal phalanx with subchondral collapse and subchondral cystic change suggesting osteonecrosis. Findings do not appear appreciably changed compared to multiple prior radiographs. Although felt to be less likely, signal changes related to osteomyelitis  would be difficult to entirely exclude. 3. Small first MTP joint effusion is likely reactive. Septic arthritis not excluded. 4. Additional findings suggestive of chronic sesamoiditis. Electronically Signed   By: Davina Poke D.O.   On: 01/13/2020 10:34   US Venous Img Lower Unilateral Right (DVT)  Result Date: 01/12/2020 CLINICAL DATA:  Calf and popliteal fossa pain x1 week, edema, redness EXAM: RIGHT LOWER EXTREMITY VENOUS DOPPLER ULTRASOUND TECHNIQUE: Gray-scale  sonography with compression, as well as color and duplex ultrasound, were performed to evaluate the deep venous system(s) from the level of the common femoral vein through the popliteal and proximal calf veins. COMPARISON:  None. FINDINGS: VENOUS Normal compressibility of the common femoral, superficial femoral, and popliteal veins, as well as the visualized calf veins. Visualized portions of profunda femoral vein and great saphenous vein unremarkable. No filling defects to suggest DVT on grayscale or color Doppler imaging. Doppler waveforms show normal direction of venous flow, normal respiratory phasicity and response to augmentation. Limited views of the contralateral common femoral vein are unremarkable. OTHER Hypoechoic thrombus in a noncompressible right short saphenous vein, and in the great saphenous vein below the knee. Hypoechoic right inguinal lymph node 1.1 cm short axis diameter. Limitations: none IMPRESSION: 1. Negative for DVT. 2. Superficial thrombophlebitis involving right short saphenous vein, and right GSV below the knee. Electronically Signed   By: Lucrezia Europe M.D.   On: 01/12/2020 16:11   US ARTERIAL ABI (SCREENING LOWER EXTREMITY)  Result Date: 01/13/2020 CLINICAL DATA:  59 year old male with a history of foot wound on right EXAM: NONINVASIVE PHYSIOLOGIC VASCULAR STUDY OF BILATERAL LOWER EXTREMITIES TECHNIQUE: Evaluation of both lower extremities was performed at rest, including calculation of ankle-brachial indices, multiple segmental pressure evaluation, segmental Doppler and segmental pulse volume recording. COMPARISON:  None. FINDINGS: Right ABI:  1.04 Left ABI:  1.09 Right Lower Extremity: Segmental Doppler at the right ankle demonstrates monophasic dorsalis pedis and posterior tibial artery Left Lower Extremity: Segmental Doppler on the left demonstrates triphasic posterior tibial artery and monophasic dorsalis pedis IMPRESSION: Right: Resting ABI within normal limits, though  potentially falsely elevated. Segmental Doppler at the ankle demonstrates monophasic waveforms, indicating more proximal occlusive disease. Left: Resting ABI within normal limits, though potentially falsely elevated. Segmental exam demonstrates waveform maintained in the posterior tibial artery and monophasic in the dorsalis pedis. Signed, Dulcy Fanny. Dellia Nims, RPVI [facility] Electronically Signed   By: Corrie Mckusick D.O.   On: 01/13/2020 13:49   DG Foot Complete Right  Result Date: 01/12/2020 CLINICAL DATA:  Diabetic foot infection. EXAM: RIGHT FOOT COMPLETE - 3+ VIEW COMPARISON:  April 05, 2019 FINDINGS: There is no evidence of an acute fracture or dislocation. A chronic fracture of the proximal phalanx of the fifth right toe is noted. There is a large plantar calcaneal spur. Moderate severity degenerative changes are seen involving the metatarsophalangeal joint of the right great toe. Moderate to marked severity vascular calcification is seen. A mild amount of soft tissue air is seen along the plantar aspect of the distal right foot. Mild to moderate severity associated soft tissue swelling is seen. IMPRESSION: 1. Mild amount of soft tissue air along the plantar aspect of the distal right foot. 2. Chronic and degenerative changes, as described above. Electronically Signed   By: Virgina Norfolk M.D.   On: 01/12/2020 16:35   ECHOCARDIOGRAM COMPLETE  Result Date: 01/14/2020    ECHOCARDIOGRAM REPORT   Patient Name:   HASSEN BRUUN Date of  Exam: 01/14/2020 Medical Rec #:  [id number]       Height:       69.0 in Accession #:    [id number]      Weight:       214.0 lb Date of Birth:  1961/03/26       BSA:          2.126 m Patient Age:    89 years        BP:           131/86 mmHg Patient Gender: M               HR:           76 bpm. Exam Location:  [facility] Procedure: 2D Echo Indications:    Bacteremia 790.7 / R78.81  History:        Patient has no  prior history of Echocardiogram examinations.                 Risk Factors:Dyslipidemia, Non-Smoker, Diabetes and                 Hypertension. Acute Kidney Injury, GERD.  Sonographer:    Leavy Cella RDCS (AE) Referring Phys: [place]  1. Left ventricular ejection fraction, by estimation, is 55 to 60%. The left ventricle has normal function. The left ventricle has no regional wall motion abnormalities. There is moderate left ventricular hypertrophy. Left ventricular diastolic parameters are indeterminate.  2. Right ventricular systolic function is normal. The right ventricular size is normal. There is normal pulmonary artery systolic pressure.  3. The mitral valve is normal in structure. No evidence of mitral valve regurgitation.  4. The aortic valve is tricuspid. Aortic valve regurgitation is not visualized. Mild aortic valve sclerosis is present, with no evidence of aortic valve stenosis.  5. The inferior vena cava is normal in size with greater than 50% respiratory variability, suggesting right atrial pressure of 3 mmHg. Conclusion(s)/Recommendation(s): No vegetation seen. If high clinical suspicion for endocarditis, consider TEE. FINDINGS  Left Ventricle: Left ventricular ejection fraction, by estimation, is 55 to 60%. The left ventricle has normal function. The left ventricle has no regional wall motion abnormalities. The left ventricular internal cavity size was small. There is moderate  left ventricular hypertrophy. Left ventricular diastolic parameters are indeterminate. Right Ventricle: The right ventricular size is normal. Right vetricular wall thickness was not assessed. Right ventricular systolic function is normal. There is normal pulmonary artery systolic pressure. The tricuspid regurgitant velocity is 1.57 m/s, and with an assumed right atrial pressure of 10 mmHg, the estimated right ventricular systolic pressure is 87.5 mmHg. Left Atrium: Left atrial size was normal in  size. Right Atrium: Right atrial size was normal in size. Pericardium: There is no evidence of pericardial effusion. Mitral Valve: The mitral valve is normal in structure. No evidence of mitral valve regurgitation. Tricuspid Valve: The tricuspid valve is normal in structure. Tricuspid valve regurgitation is trivial. Aortic Valve: The aortic valve is tricuspid. Aortic valve regurgitation is not visualized. Mild aortic valve sclerosis is present, with no evidence of aortic valve stenosis. Pulmonic Valve: The pulmonic valve was not well visualized. Pulmonic valve regurgitation is not visualized. Aorta: The aortic root is normal in size and structure. Venous: The inferior vena cava is normal in size with greater than 50% respiratory variability, suggesting right atrial pressure of 3 mmHg. IAS/Shunts: The interatrial septum was not well visualized.  LEFT VENTRICLE PLAX 2D LVIDd:  2.94 cm  Diastology LVIDs:         2.08 cm  LV e' lateral:   6.09 cm/s LV PW:         1.50 cm  LV E/e' lateral: 11.4 LV IVS:        1.66 cm  LV e' medial:    6.09 cm/s LVOT diam:     2.10 cm  LV E/e' medial:  11.4 LVOT Area:     3.46 cm  LEFT ATRIUM             Index       RIGHT ATRIUM          Index LA diam:        4.00 cm 1.88 cm/m  RA Area:     9.76 cm LA Vol (A2C):   59.1 ml 27.80 ml/m RA Volume:   18.60 ml 8.75 ml/m LA Vol (A4C):   33.7 ml 15.85 ml/m LA Biplane Vol: 48.1 ml 22.62 ml/m   AORTA Ao Root diam: 3.60 cm MITRAL VALVE               TRICUSPID VALVE MV Area (PHT): 3.54 cm    TR Peak grad:   9.9 mmHg MV Decel Time: 214 msec    TR Vmax:        157.00 cm/s MV E velocity: 69.40 cm/s MV A velocity: 67.70 cm/s  SHUNTS MV E/A ratio:  1.03        Systemic Diam: 2.10 cm Oswaldo Milian MD Electronically signed by Oswaldo Milian MD Signature Date/Time: 01/14/2020/3:03:03 PM    Final    ECHO TEE  Result Date: 01/17/2020    TRANSESOPHOGEAL ECHO REPORT   Patient Name:   SKY BORBOA Date of Exam: 01/17/2020 Medical  Rec #:  [id number]       Height:       69.0 in Accession #:    [id number]      Weight:       214.0 lb Date of Birth:  1961-03-04       BSA:          2.126 m Patient Age:    69 years        BP:           115/67 mmHg Patient Gender: M               HR:           55 bpm. Exam Location:  [facility] Procedure: Transesophageal Echo Indications:    Bacteremia 790.7 / R78.81  History:        Patient has prior history of Echocardiogram examinations, most                 recent 01/14/2020. Risk Factors:Diabetes, Hypertension,                 Dyslipidemia and Non-Smoker. Immunosuppression , GERD.  Sonographer:    Leavy Cella RDCS (AE) Referring Phys: [id number] Alphonse Guild BRANCH PROCEDURE: The transesophogeal probe was passed without difficulty through the esophogus of the patient. Sedation performed by performing physician. The patient was monitored while under deep sedation. Anesthestetic sedation was provided intravenously by  Anesthesiology: 270m of Propofol. The patient developed no complications during the procedure. IMPRESSIONS  1. Left ventricular ejection fraction, by estimation, is 55 to 60%. The left ventricle has normal function. The left ventricle has no regional wall motion abnormalities.  2. Right ventricular systolic function is normal. The right ventricular  size is normal.  3. Left atrial size was mildly dilated. No left atrial/left atrial appendage thrombus was detected. The LAA emptying velocity was 90 cm/s.  4. Right atrial size was mildly dilated.  5. The mitral valve is normal in structure. Trivial mitral valve regurgitation. No evidence of mitral stenosis.  6. The aortic valve is tricuspid. Aortic valve regurgitation is not visualized. No aortic stenosis is present. Conclusion(s)/Recommendation(s): No evidence of vegetation/infective endocarditis on this transesophageal echocardiogram. FINDINGS  Left Ventricle: Left ventricular ejection fraction, by estimation, is 55 to 60%. The left ventricle has normal  function. The left ventricle has no regional wall motion abnormalities. The left ventricular internal cavity size was normal in size. There is  no left ventricular hypertrophy. Right Ventricle: The right ventricular size is normal. No increase in right ventricular wall thickness. Right ventricular systolic function is normal. Left Atrium: Left atrial size was mildly dilated. No left atrial/left atrial appendage thrombus was detected. The LAA emptying velocity was 90 cm/s. Right Atrium: Right atrial size was mildly dilated. Pericardium: There is no evidence of pericardial effusion. Mitral Valve: The mitral valve is normal in structure. Trivial mitral valve regurgitation. No evidence of mitral valve stenosis. Tricuspid Valve: The tricuspid valve is normal in structure. Tricuspid valve regurgitation is trivial. No evidence of tricuspid stenosis. Aortic Valve: The aortic valve is tricuspid. Aortic valve regurgitation is not visualized. No aortic stenosis is present. Pulmonic Valve: The pulmonic valve was not well visualized. Pulmonic valve regurgitation is not visualized. No evidence of pulmonic stenosis. Aorta: The aortic root is normal in size and structure. IAS/Shunts: No atrial level shunt detected by color flow Doppler. [name] MD Electronically signed by [name] MD Signature Date/Time: 01/17/2020/12:21:15 PM    Final    [place] EKG SITE RITE  Result Date: 01/18/2020 If Site Rite image not attached, placement could not be confirmed due to current cardiac rhythm.   Orson Eva, DO  [facility]  If 7PM-7AM, please contact night-coverage [email or web address] Password [facility] 01/18/2020, 4:38 PM   LOS: 6 days

## 2020-01-18 NOTE — Progress Notes (Signed)
   Patient Status: [facility] IP  Assessment and Plan: Patient in need of venous access.   Tunneled central catheter placement  ______________________________________________________________________   History of Present Illness: Raymond [name] is a 59 y.o. 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   Review of Systems: A 12 point ROS discussed and pertinent positives are indicated in the HPI above.  All other systems are negative.   Vital Signs: BP (!) 156/85 (BP Location: Right Arm)   Pulse 65   Temp 98.2 F (36.8 C) (Oral)   Resp 18   Ht 5\' 9"  (1.753 m)   Wt 214 lb (97.1 kg)   SpO2 99%   BMI 31.60 kg/m   Imaging reviewed.   Labs:  COAGS: No results for input(s): INR, APTT in the last 8760 hours.  BMP: Recent Labs    12/12/19 1224 01/12/20 1328 01/13/20 0722 01/17/20 0545  NA 138 128* 136 138  K 4.8 4.7 3.8 4.1  CL 104 96* 109 111  CO2 22 21* 18* 19*  GLUCOSE 369* 609* 166* 240*  BUN 28* 55* 60* 23*  CALCIUM 9.8 9.7 8.4* 8.9  CREATININE 1.34* 2.10* 1.71* 1.05  GFRNONAA 58* 33* 43* >60  GFRAA 67 39* 50* >60    Scheduled for tunneled central catheter placement Scheduled for 8/27 in IR at [facility] (schedule cannot accommodate tomorrow) **Dr Tat aware** Pt to be at [facility] via ambulance 1000 am 8/27 Will return to [facility] after procedure He is aware of procedure benefits and risks Including but not limited to Infection; bleeding; vessel damage Pt is agreeable to proceed Consent signed in chart   Electronically Signed: Lavonia Drafts, PA-C 01/18/2020, 3:37 PM   I spent a total of 15 minutes in face to face in clinical consultation, greater than 50% of which was counseling/coordinating care for venous  access.

## 2020-01-18 NOTE — Progress Notes (Signed)
SWOT RN and primary RN aware that PICC needs to be inserted by IR.

## 2020-01-19 ENCOUNTER — Other Ambulatory Visit: Payer: Self-pay | Admitting: Student

## 2020-01-19 ENCOUNTER — Inpatient Hospital Stay (HOSPITAL_COMMUNITY): Payer: BC Managed Care – PPO

## 2020-01-19 DIAGNOSIS — I8001 Phlebitis and thrombophlebitis of superficial vessels of right lower extremity: Secondary | ICD-10-CM

## 2020-01-19 LAB — CBC
HCT: 41 % (ref 39.0–52.0)
Hemoglobin: 12.1 g/dL — ABNORMAL LOW (ref 13.0–17.0)
MCH: 25.4 pg — ABNORMAL LOW (ref 26.0–34.0)
MCHC: 29.5 g/dL — ABNORMAL LOW (ref 30.0–36.0)
MCV: 86 fL (ref 80.0–100.0)
Platelets: 176 10*3/uL (ref 150–400)
RBC: 4.77 MIL/uL (ref 4.22–5.81)
RDW: 14.6 % (ref 11.5–15.5)
WBC: 5.8 10*3/uL (ref 4.0–10.5)
nRBC: 0 % (ref 0.0–0.2)

## 2020-01-19 LAB — BASIC METABOLIC PANEL
Anion gap: 6 (ref 5–15)
BUN: 22 mg/dL — ABNORMAL HIGH (ref 6–20)
CO2: 22 mmol/L (ref 22–32)
Calcium: 9.1 mg/dL (ref 8.9–10.3)
Chloride: 108 mmol/L (ref 98–111)
Creatinine, Ser: 1.09 mg/dL (ref 0.61–1.24)
GFR calc Af Amer: >60 mL/min (ref >60)
GFR calc non Af Amer: >60 mL/min (ref >60)
Glucose, Bld: 140 mg/dL — ABNORMAL HIGH (ref 70–99)
Potassium: 4.6 mmol/L (ref 3.5–5.1)
Sodium: 136 mmol/L (ref 135–145)

## 2020-01-19 LAB — GLUCOSE, CAPILLARY
Glucose-Capillary: 123 mg/dL — ABNORMAL HIGH (ref 70–99)
Glucose-Capillary: 211 mg/dL — ABNORMAL HIGH (ref 70–99)
Glucose-Capillary: 132 mg/dL — ABNORMAL HIGH (ref 70–99)
Glucose-Capillary: 97 mg/dL (ref 70–99)

## 2020-01-19 LAB — C-REACTIVE PROTEIN: CRP: 7 mg/dL — ABNORMAL HIGH (ref <1.0)

## 2020-01-19 LAB — SEDIMENTATION RATE: Sed Rate: 90 mm/hr — ABNORMAL HIGH (ref 0–16)

## 2020-01-19 MED ORDER — CYCLOBENZAPRINE HCL 10 MG PO TABS
5.0000 mg | ORAL_TABLET | Freq: Three times a day (TID) | ORAL | Status: DC | PRN
  Administered 2020-01-19 – 2020-01-20 (×2): 5 mg via ORAL
  Filled 2020-01-19 (×2): qty 1

## 2020-01-19 MED ORDER — POVIDONE-IODINE 5 % EX SOLN
Freq: Two times a day (BID) | CUTANEOUS | Status: DC
  Filled 2020-01-19: qty 88.7

## 2020-01-19 NOTE — Plan of Care (Signed)

## 2020-01-19 NOTE — Progress Notes (Signed)
PROGRESS NOTE  Raymond [name] UEA:[id number] DOB: [birth year] DOA: 01/12/2020 PCP: [name], FNP  Brief History:  As per H&P written by Dr. 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, it was sent by podiatry for evaluation for wound in the right plantar foot, it was seen today by podiatry, and he had a concern of worsening foot infection, so he was sent to ED for further evaluation, patient reports symptoms has been going on for last 2 weeks, and currently he is with purulent foul-smelling odor, as well there was a concern for possible DVT in his right lower extremity given swelling, patient was at [facility] ED yesterday, and he left prior to getting his imaging study done, labs during that visit showing elevated D-dimers greater than 19, patient himself denies any chest pain, palpitation, dizziness, he does report mild dyspnea, report he has been compliant with his Lantus and glipizide. - in [place] significant for pseudohyponatremia sodium of 128, creatinine of 2.1 from baseline 1.3, glucose of 609, but normal anion gap, and bicarb of 21, had low platelet at 81, negative, he is unvaccinated, started on vancomycin, [facility] hospitalist consulted to admit. He was switched to daptomycin.  Blood cultures showed MRSA bacteremia.  Repeat blood cultures were negative and TEE was negative. General surgery was consulted.  They did not feel the patient needed operative management.  MR of right foot showed a fluid collection about the first MTP joint and "osteonecrosis".  I remained concerned regarding poor source controlled for the patient's MRSA bacteremia. Therefore, the patient will be transferred to [facility] to see his podiatrist for possible operative management.  I spoke with Dr. Celesta Gentile, the  patient's podiatrist who agreed to see patient after transfer to [facility].   Assessment/Plan: right diabetic foot ulcer/MRSA bacteremia:  -presented Open wound with purulent discharge -Present for the last 2 weeks and worsening -had 8 weeks IV daptomycin 02/2019 to 04/2019 -underlying history immunosuppressant due to renal transplant status.  -Continue Daptomycin. -Repeat blood cultures (8/20 and 8/22)--neg x 5 days -TEE--negative -MRI suggesting developing abscess and osteonecrosis; general surgery has been consulted -case discussed with general surgery-->feels source control obtained as drainage is improved -However, I do not feel patient has adequate source control of his MRSA bacteremia -I spoke with pt's podiatrist (Dr. Jacqualyn Posey) who agreed to see patient after transfer to [facility] -I also notified ID, Dr. Baxter Flattery -01/17/20--TEE--neg for vegetation (EF 55-60%), No WMA -Tunneled central line schedule for 01/20/20 by IR -check CRP 7.0 -ESR--pending -patient scheduled to have tunneled central line placed on 8/27 by IR  Acute on chronic renal failure--CKD3a -s/p renal transplant -baseline creatinine 1.0-1.3 -Continue minimizing nephrotoxic agents -Follow renal function trend -Gentle fluid resuscitation provided-->creatinine back to baseline -Continue Prograf.  Immunosuppression -Continue Prograf  -History of renal transplant  Neck Pain -obtain MR of cervical spine  Uncontrolled type 2 diabetes mellitus with hx of ESRD (prior to transplant) ([place]) -01/12/20 A1c 13.7 -Continue adjusted dose of insulin (Lantus) and a sliding scale. -Follow CBGs--controlled -Close follow-up after discharge with patient's endocrinologist has been recommended.  Essential hypertension, benign -Stable overall -Continue current antihypertensive regimen. -Follow vital signs.  Class 1 obesity due to excess calories -body mass index (BMI) of 31.0 to 31.9 in adult -Low calorie diet, portion  control and increase physical activity discussed with patient -  Body mass index is 31.6 kg/m.  elevated D-dimer/right lower extremity superficial thrombophlebitis -[place] right leg--No DVT; +SVT R-short saphenous vein -VQ scan also negative for concerns of pulmonary embolism. -Continue supportive care, TED hoses usage and 123m of Aspirin.  -continue PRN analgesics.       Status is: Inpatient  Remains inpatient appropriate because:IV treatments appropriate due to intensity of illness or inability to take PO   Dispo: The patient is from: Home              Anticipated d/c is to: Home              Anticipated d/c date is: 2 days              Patient currently is not medically stable to d/c.        Family Communication:   Spouse updated at bedside 8/26  Consultants:  ID, podiatry, general surgery  Code Status:  FULL   DVT Prophylaxis:  [place] Heparin   Procedures: As Listed in Progress Note Above  Antibiotics: daptomycin 8/20>>>      Total time spent 35 minutes.  Greater than 50% spent face to face counseling and coordinating care.     Subjective:  Patient denies fevers, chills, headache, chest pain, dyspnea, nausea, vomiting, diarrhea, abdominal pain, dysuria, hematuria, hematochezia, and melena.  Objective: Vitals:   01/18/20 1500 01/18/20 2024 01/19/20 0425 01/19/20 0916  BP: 131/75 (!) 158/80 (!) 144/91 (!) 159/79  Pulse: 70 71 73 65  Resp: _0 Temp: 98.3 F (36.8 C) 98.7 F (37.1 C) 98.1 F (36.7 C) 98 F (36.7 C)  TempSrc: Oral Oral Oral Oral  SpO2: 100% 99% 100% 100%  Weight:      Height:       No intake or output data in the 24 hours ending 01/19/20 1317 Weight change:  Exam:   General:  Pt is alert, follows commands appropriately, not in acute distress  HEENT: No icterus, No thrush, No neck mass, [place]/AT  Cardiovascular: RRR, S1/S2, no rubs, no gallops  Respiratory: CTA bilaterally, no wheezing, no crackles, no  rhonchi  Abdomen: Soft/+BS, non tender, non distended, no guarding Extremities: see pictures from earlier today under separate attachment  Data Reviewed: I have personally reviewed following labs and imaging studies Basic Metabolic Panel: Recent Labs  Lab 01/12/20 1328 01/13/20 0722 01/17/20 0545 01/19/20 0801  NA 128* 136 138 136  K 4.7 3.8 4.1 4.6  CL 96* 109 111 108  CO2 21* 18* 19* 22  GLUCOSE 609* 166* 240* 140*  BUN 55* 60* 23* 22*  CREATININE 2.10* 1.71* 1.05 1.09  CALCIUM 9.7 8.4* 8.9 9.1   Liver Function Tests: Recent Labs  Lab 01/13/20 0722  AST 15  ALT 17  ALKPHOS 97  BILITOT 0.8  PROT 6.1*  ALBUMIN 2.5*   No results for input(s): LIPASE, AMYLASE in the last 168 hours. No results for input(s): AMMONIA in the last 168 hours. Coagulation Profile: No results for input(s): INR, PROTIME in the last 168 hours. CBC: Recent Labs  Lab 01/12/20 1328 01/13/20 0722 01/17/20 0545 01/19/20 0801  WBC 6.0 8.4 6.1 5.8  NEUTROABS 5.0  --   --   --   HGB 14.1 11.3* 12.6* 12.1*  HCT 45.2 37.0* 42.3 41.0  MCV 82.8 83.5 84.4 86.0  PLT 81* 66* 144* 176   Cardiac Enzymes: Recent Labs  Lab 01/13/20 0722  CKTOTAL 30*   BNP: Invalid input(s): POCBNP CBG:  Recent Labs  Lab 01/18/20 1131 01/18/20 1650 01/18/20 2100 01/19/20 0800 01/19/20 1146  GLUCAP 145* 123* 150* 123* 211*   HbA1C: No results for input(s): HGBA1C in the last 72 hours. Urine analysis:    Component Value Date/Time   COLORURINE YELLOW (A) 09/17/2018 0910   APPEARANCEUR CLEAR (A) 09/17/2018 0910   LABSPEC 1.028 09/17/2018 0910   PHURINE 5.0 09/17/2018 0910   GLUCOSEU 150 (A) 09/17/2018 0910   HGBUR NEGATIVE 09/17/2018 0910   BILIRUBINUR negative 12/12/2019 [place] 09/17/2018 0910   PROTEINUR Positive (A) 12/12/2019 1715   PROTEINUR 30 (A) 09/17/2018 0910   UROBILINOGEN 0.2 12/12/2019 1715   NITRITE negative 12/12/2019 1715   NITRITE NEGATIVE 09/17/2018 0910    LEUKOCYTESUR Negative 12/12/2019 1715   LEUKOCYTESUR NEGATIVE 09/17/2018 0910   Sepsis Labs: _0 (procalcitonin:4,lacticidven:4) ) Recent Results (from the past 240 hour(s))  WOUND CULTURE     Status: Abnormal   Collection Time: 01/12/20  2:12 PM  Result Value Ref Range Status   MICRO NUMBER: [id number]  Final   SPECIMEN QUALITY: Adequate  Final   SOURCE: NOT GIVEN  Final   STATUS: FINAL  Final   GRAM STAIN:   Final    Rare White blood cells seen No epithelial cells seen Many Gram positive cocci in pairs Few Gram positive bacilli   ISOLATE 1: methicillin resistant Staphylococcus aureus (A)  Final    Comment: Heavy growth of Methicillin resistant Staphylococcus aureus (MRSA) Negative for inducible clindamycin resistance.      Susceptibility   Methicillin resistant staphylococcus aureus - AEROBIC CULT, GRAM STAIN POSITIVE 1    VANCOMYCIN 1 Sensitive     CIPROFLOXACIN >=8 Resistant     CLINDAMYCIN <=0.25 Sensitive     LEVOFLOXACIN 4 Resistant     ERYTHROMYCIN >=8 Resistant     GENTAMICIN <=0.5 Sensitive     OXACILLIN >=4 Resistant     TETRACYCLINE >=16 Resistant     TRIMETH/SULFA* <=10 Sensitive      * Legend:S = Susceptible  I = IntermediateR = Resistant  NS = Not susceptible* = Not tested  NR = Not reported**NN = See antimicrobic comments  SARS Coronavirus 2 by RT PCR (hospital order, performed in [place] hospital lab) Nasopharyngeal Nasopharyngeal Swab     Status: None   Collection Time: 01/12/20  4:06 PM   Specimen: Nasopharyngeal Swab  Result Value Ref Range Status   SARS Coronavirus 2 NEGATIVE NEGATIVE Final    Comment: (NOTE) SARS-CoV-2 target nucleic acids are NOT DETECTED.  The SARS-CoV-2 RNA is generally detectable in upper and lower respiratory specimens during the acute phase of infection. The lowest concentration of SARS-CoV-2 viral copies this assay can detect is 250 copies / mL. A negative result does not preclude SARS-CoV-2 infection and should not be  used as the sole basis for treatment or other patient management decisions.  A negative result may occur with improper specimen collection / handling, submission of specimen other than nasopharyngeal swab, presence of viral mutation(s) within the areas targeted by this assay, and inadequate number of viral copies (<250 copies / mL). A negative result must be combined with clinical observations, patient history, and epidemiological information.  Fact Sheet for Patients:   StrictlyIdeas.no  Fact Sheet for Healthcare Providers: BankingDealers.co.za  This test is not yet approved or  cleared by the [place] FDA and has been authorized for detection and/or diagnosis of SARS-CoV-2 by FDA under an Emergency Use Authorization (EUA).  This EUA will  remain in effect (meaning this test can be used) for the duration of the COVID-19 declaration under Section 564(b)(1) of the Act, 21 U.S.C. section 360bbb-3(b)(1), unless the authorization is terminated or revoked sooner.  Performed at [facility], [address] [place], [place] [address]   Culture, blood (routine x 2)     Status: Abnormal   Collection Time: 01/12/20  6:16 PM   Specimen: BLOOD RIGHT HAND  Result Value Ref Range Status   Specimen Description   Final    BLOOD RIGHT HAND Performed at [facility], [address] [place], [place] [address]    Special Requests   Final    BOTTLES DRAWN AEROBIC AND ANAEROBIC Blood Culture adequate volume Performed at [place] [place], [place] [address]    Culture  Setup Time   Final    ANAEROBIC and AEROBIC BOTTLE GRAM POSITIVE COCCI Gram Stain Report Called to,Read Back By and Verified With: DEAN,T_0  BY MATTHEWS, B 8.20.2021 [facility] Organism ID to follow CRITICAL RESULT CALLED TO, READ BACK BY AND VERIFIED WITH: A,MUHAMMAD RN _1  01/13/20 EB    Culture (A)  Final    METHICILLIN RESISTANT STAPHYLOCOCCUS  AUREUS ENTEROCOCCUS FAECALIS CRITICAL RESULT CALLED TO, READ BACK BY AND VERIFIED WITH: Tally Due [id number] FCP Performed at [facility], [place] [address], [place], [place] [address]    Report Status 01/15/2020 FINAL  Final   Organism ID, Bacteria METHICILLIN RESISTANT STAPHYLOCOCCUS AUREUS  Final   Organism ID, Bacteria ENTEROCOCCUS FAECALIS  Final      Susceptibility   Enterococcus faecalis - MIC*    AMPICILLIN <=2 SENSITIVE Sensitive     VANCOMYCIN 1 SENSITIVE Sensitive     GENTAMICIN SYNERGY SENSITIVE Sensitive     * ENTEROCOCCUS FAECALIS   Methicillin resistant staphylococcus aureus - MIC*    CIPROFLOXACIN >=8 RESISTANT Resistant     ERYTHROMYCIN >=8 RESISTANT Resistant     GENTAMICIN <=0.5 SENSITIVE Sensitive     OXACILLIN >=4 RESISTANT Resistant     TETRACYCLINE >=16 RESISTANT Resistant     VANCOMYCIN <=0.5 SENSITIVE Sensitive     TRIMETH/SULFA <=10 SENSITIVE Sensitive     CLINDAMYCIN <=0.25 SENSITIVE Sensitive     RIFAMPIN <=0.5 SENSITIVE Sensitive     Inducible Clindamycin NEGATIVE Sensitive     * METHICILLIN RESISTANT STAPHYLOCOCCUS AUREUS  Culture, blood (routine x 2)     Status: Abnormal   Collection Time: 01/12/20  6:16 PM   Specimen: BLOOD RIGHT HAND  Result Value Ref Range Status   Specimen Description   Final    BLOOD RIGHT HAND Performed at [facility], [address], [place], [place] [address]    Special Requests   Final    BOTTLES DRAWN AEROBIC AND ANAEROBIC Blood Culture adequate volume Performed at [facility], [address] [place], [place] [address]    Culture  Setup Time   Final    ANAEROBIC AND AEROBIC BOTTLES GRAM POSITIVE COCCI Gram Stain Report Called to,Read Back By and Verified With: DEAN,T_2  BY MATTHEWS, B 8.20.21 [facility] CRITICAL VALUE NOTED.  VALUE IS CONSISTENT WITH PREVIOUSLY REPORTED AND CALLED VALUE.    Culture (A)  Final    STAPHYLOCOCCUS AUREUS ENTEROCOCCUS FAECALIS SUSCEPTIBILITIES PERFORMED ON  PREVIOUS CULTURE WITHIN THE LAST 5 DAYS. Performed at [facility], [place] [address], [place],  [address]    Report Status 01/15/2020 FINAL  Final  Blood Culture ID Panel (Reflexed)     Status:  Abnormal   Collection Time: 01/12/20  6:16 PM  Result Value Ref Range Status   Enterococcus faecalis NOT DETECTED NOT DETECTED Final   Enterococcus Faecium NOT DETECTED NOT DETECTED Final   Listeria monocytogenes NOT DETECTED NOT DETECTED Final   Staphylococcus species DETECTED (A) NOT DETECTED Final    Comment: RESULT CALLED TO, READ BACK BY AND VERIFIED WITH: A,MUHAMMAD RN _0  01/13/20 EB    Staphylococcus aureus (BCID) DETECTED (A) NOT DETECTED Final    Comment: Methicillin (oxacillin)-resistant Staphylococcus aureus (MRSA). MRSA is predictably resistant to beta-lactam antibiotics (except ceftaroline). Preferred therapy is vancomycin unless clinically contraindicated. Patient requires contact precautions if  hospitalized. RESULT CALLED TO, READ BACK BY AND VERIFIED WITH: A,MUHAMMAD RN _1  01/13/20 EB    Staphylococcus epidermidis NOT DETECTED NOT DETECTED Final   Staphylococcus lugdunensis NOT DETECTED NOT DETECTED Final   Streptococcus species NOT DETECTED NOT DETECTED Final   Streptococcus agalactiae NOT DETECTED NOT DETECTED Final   Streptococcus pneumoniae NOT DETECTED NOT DETECTED Final   Streptococcus pyogenes NOT DETECTED NOT DETECTED Final   A.calcoaceticus-baumannii NOT DETECTED NOT DETECTED Final   Bacteroides fragilis NOT DETECTED NOT DETECTED Final   Enterobacterales NOT DETECTED NOT DETECTED Final   Enterobacter cloacae complex NOT DETECTED NOT DETECTED Final   Escherichia coli NOT DETECTED NOT DETECTED Final   Klebsiella aerogenes NOT DETECTED NOT DETECTED Final   Klebsiella oxytoca NOT DETECTED NOT DETECTED Final   Klebsiella pneumoniae NOT DETECTED NOT DETECTED Final   Proteus species NOT DETECTED NOT DETECTED Final   Salmonella species NOT DETECTED NOT  DETECTED Final   Serratia marcescens NOT DETECTED NOT DETECTED Final   Haemophilus influenzae NOT DETECTED NOT DETECTED Final   Neisseria meningitidis NOT DETECTED NOT DETECTED Final   Pseudomonas aeruginosa NOT DETECTED NOT DETECTED Final   Stenotrophomonas maltophilia NOT DETECTED NOT DETECTED Final   Candida albicans NOT DETECTED NOT DETECTED Final   Candida auris NOT DETECTED NOT DETECTED Final   Candida glabrata NOT DETECTED NOT DETECTED Final   Candida krusei NOT DETECTED NOT DETECTED Final   Candida parapsilosis NOT DETECTED NOT DETECTED Final   Candida tropicalis NOT DETECTED NOT DETECTED Final   Cryptococcus neoformans/gattii NOT DETECTED NOT DETECTED Final   Meth resistant mecA/C and MREJ DETECTED (A) NOT DETECTED Final    Comment: RESULT CALLED TO, READ BACK BY AND VERIFIED WITH: A,MUHAMMAD RN _2  01/13/20 EB Performed at [facility], [address], [place], [place] [address]   Culture, blood (Routine X 2) w Reflex to ID Panel     Status: None   Collection Time: 01/13/20  9:04 PM   Specimen: BLOOD RIGHT HAND  Result Value Ref Range Status   Specimen Description BLOOD RIGHT HAND  Final   Special Requests   Final    BOTTLES DRAWN AEROBIC AND ANAEROBIC Blood Culture adequate volume   Culture   Final    NO GROWTH 5 DAYS Performed at [facility], [address] [place], [place] [address]    Report Status 01/18/2020 FINAL  Final  Culture, blood (Routine X 2) w Reflex to ID Panel     Status: None   Collection Time: 01/13/20  9:04 PM   Specimen: BLOOD RIGHT HAND  Result Value Ref Range Status   Specimen Description BLOOD RIGHT HAND  Final   Special Requests   Final    BOTTLES DRAWN AEROBIC AND ANAEROBIC Blood Culture adequate volume   Culture   Final    NO GROWTH 5 DAYS Performed  at [facility], [address] [place], [place] [address]    Report Status 01/18/2020 FINAL  Final  Culture, blood (Routine X 2) w Reflex to ID Panel     Status: None (Preliminary  result)   Collection Time: 01/15/20  1:46 PM   Specimen: BLOOD RIGHT HAND  Result Value Ref Range Status   Specimen Description   Final    BLOOD RIGHT HAND BOTTLES DRAWN AEROBIC AND ANAEROBIC   Special Requests Blood Culture adequate volume  Final   Culture   Final    NO GROWTH 4 DAYS Performed at [facility], [address] [place], [place] [address]    Report Status PENDING  Incomplete  Culture, blood (Routine X 2) w Reflex to ID Panel     Status: None (Preliminary result)   Collection Time: 01/15/20  1:46 PM   Specimen: Right Antecubital; Blood  Result Value Ref Range Status   Specimen Description   Final    RIGHT ANTECUBITAL BOTTLES DRAWN AEROBIC AND ANAEROBIC   Special Requests Blood Culture adequate volume  Final   Culture   Final    NO GROWTH 4 DAYS Performed at [name], [address], [place], [place] [address]    Report Status PENDING  Incomplete     Scheduled Meds: . amLODipine  5 mg Oral Daily  . aspirin EC  162 mg Oral Daily  . doxazosin  4 mg Oral Daily  . heparin  5,000 Units Subcutaneous Q8H  . hydrALAZINE  25 mg Oral BID  . insulin aspart  0-15 Units Subcutaneous TID WC  . insulin aspart  0-5 Units Subcutaneous QHS  . insulin aspart  4 Units Subcutaneous TID WC  . insulin glargine  30 Units Subcutaneous BID  . multivitamin  1 tablet Oral Daily  . pantoprazole  40 mg Oral Daily  . povidone-Iodine   Topical BID  . tacrolimus  3 mg Oral BID  . Vitamin D (Ergocalciferol)  50,000 Units Oral Q30 days   Continuous Infusions: . sodium chloride 75 mL/hr at 01/17/20 2247  . sodium chloride 75 mL/hr at 01/19/20 0400  . DAPTOmycin (CUBICIN)  IV Stopped (01/18/20 2201)    Procedures/Studies: NM Pulmonary Perfusion  Result Date: 01/13/2020 CLINICAL DATA:  Chest pain and shortness of breath with positive D-dimer EXAM: NUCLEAR MEDICINE PERFUSION LUNG SCAN TECHNIQUE: Perfusion images were obtained in multiple projections after intravenous injection of  radiopharmaceutical. Views: Anterior, posterior, left lateral, right lateral, RPO, LPO, RAO, LAO RADIOPHARMACEUTICALS:  3.8 mCi Tc-33mMAA IV COMPARISON:  None. FINDINGS: Radiotracer uptake is homogeneous and symmetric bilaterally. No perfusion defects evident. IMPRESSION: No evident perfusion defects. No findings indicative of pulmonary embolus. Electronically Signed   By: WLowella GripIII M.D.   On: 01/13/2020 08:46   MR FOOT RIGHT WO CONTRAST  Result Date: 01/13/2020 CLINICAL DATA:  Diabetic foot ulcer EXAM: MRI OF THE RIGHT FOREFOOT WITHOUT CONTRAST TECHNIQUE: Multiplanar, multisequence MR imaging of the right foot was performed. No intravenous contrast was administered. COMPARISON:  X-ray 01/12/2020, 03/01/2018 FINDINGS: Bones/Joint/Cartilage Chronic changes to the first metatarsal head with subchondral collapse and subchondral cystic change. There is reactive sclerosis and subchondral cystic change of the opposing great toe proximal phalanx base, more pronounced along the medial aspect. Small first MTP joint effusion. The medial hallux sesamoid also appears sclerotic with heterogeneous T2 hyperintense appearance which may reflect sequela of chronic sesamoiditis. Mild degenerative changes within the midfoot including at the second metatarsal base and within the lateral cuneiform.  No acute fracture. No dislocation. No bony erosion or cortical destruction. Ligaments Intact Lisfranc ligament. Collateral ligaments of the forefoot appear intact. Muscles and Tendons Fatty atrophy of the intrinsic foot musculature suggesting chronic denervation changes. Intact flexor and extensor tendons without tenosynovitis. Soft tissues Plantar soft tissue ulceration underlies the medial forefoot at the level of the first MTP joint. Ulcer base is contiguous with a fluid and air containing collection measuring approximately 2.1 x 0.8 x 1.8 cm (series 9, image 20; series 5, image 16) suggesting a developing abscess.  IMPRESSION: 1. Plantar soft tissue ulceration underlies the medial forefoot at the level of the first MTP joint. Ulcer base is contiguous with a fluid and air containing collection measuring 2.1 x 0.8 x 1.8 cm suggesting a developing abscess. 2. Chronic bony changes to the first metatarsal head and great toe proximal phalanx with subchondral collapse and subchondral cystic change suggesting osteonecrosis. Findings do not appear appreciably changed compared to multiple prior radiographs. Although felt to be less likely, signal changes related to osteomyelitis would be difficult to entirely exclude. 3. Small first MTP joint effusion is likely reactive. Septic arthritis not excluded. 4. Additional findings suggestive of chronic sesamoiditis. Electronically Signed   By: Davina Poke D.O.   On: 01/13/2020 10:34   US Venous Img Lower Unilateral Right (DVT)  Result Date: 01/12/2020 CLINICAL DATA:  Calf and popliteal fossa pain x1 week, edema, redness EXAM: RIGHT LOWER EXTREMITY VENOUS DOPPLER ULTRASOUND TECHNIQUE: Gray-scale sonography with compression, as well as color and duplex ultrasound, were performed to evaluate the deep venous system(s) from the level of the common femoral vein through the popliteal and proximal calf veins. COMPARISON:  None. FINDINGS: VENOUS Normal compressibility of the common femoral, superficial femoral, and popliteal veins, as well as the visualized calf veins. Visualized portions of profunda femoral vein and great saphenous vein unremarkable. No filling defects to suggest DVT on grayscale or color Doppler imaging. Doppler waveforms show normal direction of venous flow, normal respiratory phasicity and response to augmentation. Limited views of the contralateral common femoral vein are unremarkable. OTHER Hypoechoic thrombus in a noncompressible right short saphenous vein, and in the great saphenous vein below the knee. Hypoechoic right inguinal lymph node 1.1 cm short axis diameter.  Limitations: none IMPRESSION: 1. Negative for DVT. 2. Superficial thrombophlebitis involving right short saphenous vein, and right GSV below the knee. Electronically Signed   By: Lucrezia Europe M.D.   On: 01/12/2020 16:11   US ARTERIAL ABI (SCREENING LOWER EXTREMITY)  Result Date: 01/13/2020 CLINICAL DATA:  59 year old male with a history of foot wound on right EXAM: NONINVASIVE PHYSIOLOGIC VASCULAR STUDY OF BILATERAL LOWER EXTREMITIES TECHNIQUE: Evaluation of both lower extremities was performed at rest, including calculation of ankle-brachial indices, multiple segmental pressure evaluation, segmental Doppler and segmental pulse volume recording. COMPARISON:  None. FINDINGS: Right ABI:  1.04 Left ABI:  1.09 Right Lower Extremity: Segmental Doppler at the right ankle demonstrates monophasic dorsalis pedis and posterior tibial artery Left Lower Extremity: Segmental Doppler on the left demonstrates triphasic posterior tibial artery and monophasic dorsalis pedis IMPRESSION: Right: Resting ABI within normal limits, though potentially falsely elevated. Segmental Doppler at the ankle demonstrates monophasic waveforms, indicating more proximal occlusive disease. Left: Resting ABI within normal limits, though potentially falsely elevated. Segmental exam demonstrates waveform maintained in the posterior tibial artery and monophasic in the dorsalis pedis. Signed, Dulcy Fanny. Dellia Nims, [facility] Electronically Signed   By: Corrie Mckusick D.O.  On: 01/13/2020 13:49   DG Foot Complete Right  Result Date: 01/12/2020 CLINICAL DATA:  Diabetic foot infection. EXAM: RIGHT FOOT COMPLETE - 3+ VIEW COMPARISON:  April 05, 2019 FINDINGS: There is no evidence of an acute fracture or dislocation. A chronic fracture of the proximal phalanx of the fifth right toe is noted. There is a large plantar calcaneal spur. Moderate severity degenerative changes are seen involving  the metatarsophalangeal joint of the right great toe. Moderate to marked severity vascular calcification is seen. A mild amount of soft tissue air is seen along the plantar aspect of the distal right foot. Mild to moderate severity associated soft tissue swelling is seen. IMPRESSION: 1. Mild amount of soft tissue air along the plantar aspect of the distal right foot. 2. Chronic and degenerative changes, as described above. Electronically Signed   By: Virgina Norfolk M.D.   On: 01/12/2020 16:35   ECHOCARDIOGRAM COMPLETE  Result Date: 01/14/2020    ECHOCARDIOGRAM REPORT   Patient Name:   Raymond [name] Date of Exam: 01/14/2020 Medical Rec #:  [id number]       Height:       69.0 in Accession #:    [id number]      Weight:       214.0 lb Date of Birth:  Apr 19, 1961       BSA:          2.126 m Patient Age:    15 years        BP:           131/86 mmHg Patient Gender: M               HR:           76 bpm. Exam Location:  [facility] Procedure: 2D Echo Indications:    Bacteremia 790.7 / R78.81  History:        Patient has no prior history of Echocardiogram examinations.                 Risk Factors:Dyslipidemia, Non-Smoker, Diabetes and                 Hypertension. Acute Kidney Injury, GERD.  Sonographer:    [name] Cella RDCS (AE) Referring Phys: [place]  1. Left ventricular ejection fraction, by estimation, is 55 to 60%. The left ventricle has normal function. The left ventricle has no regional wall motion abnormalities. There is moderate left ventricular hypertrophy. Left ventricular diastolic parameters are indeterminate.  2. Right ventricular systolic function is normal. The right ventricular size is normal. There is normal pulmonary artery systolic pressure.  3. The mitral valve is normal in structure. No evidence of mitral valve regurgitation.  4. The aortic valve is tricuspid. Aortic valve regurgitation is not visualized. Mild aortic valve sclerosis is present, with no evidence of  aortic valve stenosis.  5. The inferior vena cava is normal in size with greater than 50% respiratory variability, suggesting right atrial pressure of 3 mmHg. Conclusion(s)/Recommendation(s): No vegetation seen. If high clinical suspicion for endocarditis, consider TEE. FINDINGS  Left Ventricle: Left ventricular ejection fraction, by estimation, is 55 to 60%. The left ventricle has normal function. The left ventricle has no regional wall motion abnormalities. The left ventricular internal cavity size was small. There is moderate  left ventricular hypertrophy. Left ventricular diastolic parameters are indeterminate. Right Ventricle: The right ventricular size is normal. Right vetricular wall thickness was not assessed. Right ventricular systolic function is normal. There  is normal pulmonary artery systolic pressure. The tricuspid regurgitant velocity is 1.57 m/s, and with an assumed right atrial pressure of 10 mmHg, the estimated right ventricular systolic pressure is 81.0 mmHg. Left Atrium: Left atrial size was normal in size. Right Atrium: Right atrial size was normal in size. Pericardium: There is no evidence of pericardial effusion. Mitral Valve: The mitral valve is normal in structure. No evidence of mitral valve regurgitation. Tricuspid Valve: The tricuspid valve is normal in structure. Tricuspid valve regurgitation is trivial. Aortic Valve: The aortic valve is tricuspid. Aortic valve regurgitation is not visualized. Mild aortic valve sclerosis is present, with no evidence of aortic valve stenosis. Pulmonic Valve: The pulmonic valve was not well visualized. Pulmonic valve regurgitation is not visualized. Aorta: The aortic root is normal in size and structure. Venous: The inferior vena cava is normal in size with greater than 50% respiratory variability, suggesting right atrial pressure of 3 mmHg. IAS/Shunts: The interatrial septum was not well visualized.  LEFT VENTRICLE PLAX 2D LVIDd:         2.94 cm   Diastology LVIDs:         2.08 cm  LV e' lateral:   6.09 cm/s LV PW:         1.50 cm  LV E/e' lateral: 11.4 LV IVS:        1.66 cm  LV e' medial:    6.09 cm/s LVOT diam:     2.10 cm  LV E/e' medial:  11.4 LVOT Area:     3.46 cm  LEFT ATRIUM             Index       RIGHT ATRIUM          Index LA diam:        4.00 cm 1.88 cm/m  RA Area:     9.76 cm LA Vol (A2C):   59.1 ml 27.80 ml/m RA Volume:   18.60 ml 8.75 ml/m LA Vol (A4C):   33.7 ml 15.85 ml/m LA Biplane Vol: 48.1 ml 22.62 ml/m   AORTA Ao Root diam: 3.60 cm MITRAL VALVE               TRICUSPID VALVE MV Area (PHT): 3.54 cm    TR Peak grad:   9.9 mmHg MV Decel Time: 214 msec    TR Vmax:        157.00 cm/s MV E velocity: 69.40 cm/s MV A velocity: 67.70 cm/s  SHUNTS MV E/A ratio:  1.03        Systemic Diam: 2.10 cm Raymond Milian MD Electronically signed by Raymond Milian MD Signature Date/Time: 01/14/2020/3:03:03 PM    Final    ECHO TEE  Result Date: 01/17/2020    TRANSESOPHOGEAL ECHO REPORT   Patient Name:   Raymond [name] Date of Exam: 01/17/2020 Medical Rec #:  [id number]       Height:       69.0 in Accession #:    [id number]      Weight:       214.0 lb Date of Birth:  [birth year]       BSA:          2.126 m Patient Age:    89 years        BP:           115/67 mmHg Patient Gender: M               HR:  55 bpm. Exam Location:  [facility] Procedure: Transesophageal Echo Indications:    Bacteremia 790.7 / R78.81  History:        Patient has prior history of Echocardiogram examinations, most                 recent 01/14/2020. Risk Factors:Diabetes, Hypertension,                 Dyslipidemia and Non-Smoker. Immunosuppression , GERD.  Sonographer:    [name] Cella RDCS (AE) Referring Phys: [id number] Alphonse Guild BRANCH PROCEDURE: The transesophogeal probe was passed without difficulty through the esophogus of the patient. Sedation performed by performing physician. The patient was monitored while under deep sedation. Anesthestetic sedation was  provided intravenously by  Anesthesiology: 264m of Propofol. The patient developed no complications during the procedure. IMPRESSIONS  1. Left ventricular ejection fraction, by estimation, is 55 to 60%. The left ventricle has normal function. The left ventricle has no regional wall motion abnormalities.  2. Right ventricular systolic function is normal. The right ventricular size is normal.  3. Left atrial size was mildly dilated. No left atrial/left atrial appendage thrombus was detected. The LAA emptying velocity was 90 cm/s.  4. Right atrial size was mildly dilated.  5. The mitral valve is normal in structure. Trivial mitral valve regurgitation. No evidence of mitral stenosis.  6. The aortic valve is tricuspid. Aortic valve regurgitation is not visualized. No aortic stenosis is present. Conclusion(s)/Recommendation(s): No evidence of vegetation/infective endocarditis on this transesophageal echocardiogram. FINDINGS  Left Ventricle: Left ventricular ejection fraction, by estimation, is 55 to 60%. The left ventricle has normal function. The left ventricle has no regional wall motion abnormalities. The left ventricular internal cavity size was normal in size. There is  no left ventricular hypertrophy. Right Ventricle: The right ventricular size is normal. No increase in right ventricular wall thickness. Right ventricular systolic function is normal. Left Atrium: Left atrial size was mildly dilated. No left atrial/left atrial appendage thrombus was detected. The LAA emptying velocity was 90 cm/s. Right Atrium: Right atrial size was mildly dilated. Pericardium: There is no evidence of pericardial effusion. Mitral Valve: The mitral valve is normal in structure. Trivial mitral valve regurgitation. No evidence of mitral valve stenosis. Tricuspid Valve: The tricuspid valve is normal in structure. Tricuspid valve regurgitation is trivial. No evidence of tricuspid stenosis. Aortic Valve: The aortic valve is tricuspid.  Aortic valve regurgitation is not visualized. No aortic stenosis is present. Pulmonic Valve: The pulmonic valve was not well visualized. Pulmonic valve regurgitation is not visualized. No evidence of pulmonic stenosis. Aorta: The aortic root is normal in size and structure. IAS/Shunts: No atrial level shunt detected by color flow Doppler. Raymond DollyMD Electronically signed by Raymond DollyMD Signature Date/Time: 01/17/2020/12:21:15 PM    Final    [place] SITE RITE  Result Date: 01/18/2020 If Site Rite image not attached, placement could not be confirmed due to current cardiac rhythm.   DOrson Eva DO  [facility]  If 7PM-7AM, please contact night-coverage [email or web address] Password [email or web address] 01/19/2020, 1:17 PM   LOS: 7 days

## 2020-01-20 ENCOUNTER — Inpatient Hospital Stay (HOSPITAL_COMMUNITY): Payer: BC Managed Care – PPO

## 2020-01-20 ENCOUNTER — Encounter (HOSPITAL_COMMUNITY): Payer: Self-pay | Admitting: Internal Medicine

## 2020-01-20 ENCOUNTER — Ambulatory Visit (HOSPITAL_COMMUNITY): Admit: 2020-01-20 | Payer: BC Managed Care – PPO

## 2020-01-20 ENCOUNTER — Other Ambulatory Visit: Payer: Self-pay

## 2020-01-20 ENCOUNTER — Inpatient Hospital Stay (HOSPITAL_COMMUNITY): Payer: BC Managed Care – PPO | Admitting: Anesthesiology

## 2020-01-20 ENCOUNTER — Encounter (HOSPITAL_COMMUNITY)
Admission: EM | Disposition: A | Discharge status: Home / Self Care | Payer: Self-pay | Source: Home / Self Care | Attending: Internal Medicine

## 2020-01-20 DIAGNOSIS — M869 Osteomyelitis, unspecified: Secondary | ICD-10-CM

## 2020-01-20 DIAGNOSIS — M86171 Other acute osteomyelitis, right ankle and foot: Secondary | ICD-10-CM

## 2020-01-20 HISTORY — PX: CENTRAL VENOUS CATHETER INSERTION: SHX401

## 2020-01-20 LAB — CBC
HCT: 41.7 % (ref 39.0–52.0)
Hemoglobin: 12.5 g/dL — ABNORMAL LOW (ref 13.0–17.0)
MCH: 25.4 pg — ABNORMAL LOW (ref 26.0–34.0)
MCHC: 30 g/dL (ref 30.0–36.0)
MCV: 84.6 fL (ref 80.0–100.0)
Platelets: 195 10*3/uL (ref 150–400)
RBC: 4.93 MIL/uL (ref 4.22–5.81)
RDW: 14.5 % (ref 11.5–15.5)
WBC: 7.7 10*3/uL (ref 4.0–10.5)
nRBC: 0 % (ref 0.0–0.2)

## 2020-01-20 LAB — GLUCOSE, CAPILLARY
Glucose-Capillary: 144 mg/dL — ABNORMAL HIGH (ref 70–99)
Glucose-Capillary: 71 mg/dL (ref 70–99)
Glucose-Capillary: 122 mg/dL — ABNORMAL HIGH (ref 70–99)
Glucose-Capillary: 72 mg/dL (ref 70–99)
Glucose-Capillary: 80 mg/dL (ref 70–99)

## 2020-01-20 LAB — BASIC METABOLIC PANEL
Anion gap: 7 (ref 5–15)
BUN: 23 mg/dL — ABNORMAL HIGH (ref 6–20)
CO2: 20 mmol/L — ABNORMAL LOW (ref 22–32)
Calcium: 9.2 mg/dL (ref 8.9–10.3)
Chloride: 110 mmol/L (ref 98–111)
Creatinine, Ser: 1.14 mg/dL (ref 0.61–1.24)
GFR calc Af Amer: >60 mL/min (ref >60)
GFR calc non Af Amer: >60 mL/min (ref >60)
Glucose, Bld: 163 mg/dL — ABNORMAL HIGH (ref 70–99)
Potassium: 5.1 mmol/L (ref 3.5–5.1)
Sodium: 137 mmol/L (ref 135–145)

## 2020-01-20 LAB — CK: Total CK: 61 U/L (ref 49–397)

## 2020-01-20 LAB — CULTURE, BLOOD (ROUTINE X 2)
Culture: NO GROWTH
Culture: NO GROWTH
Special Requests: ADEQUATE
Special Requests: ADEQUATE

## 2020-01-20 SURGERY — INSERTION, CATHETER, CENTRAL VENOUS, ADULT
Anesthesia: General | Site: Chest | Laterality: Right

## 2020-01-20 MED ORDER — DEXTROSE 50 % IV SOLN
INTRAVENOUS | Status: AC
  Administered 2020-01-20: 25 mL via INTRAVENOUS
  Filled 2020-01-20: qty 50

## 2020-01-20 MED ORDER — LACTATED RINGERS IV SOLN: INTRAVENOUS | Status: DC

## 2020-01-20 MED ORDER — DEXTROSE 50 % IV SOLN: 0.5000 | Freq: Once | INTRAVENOUS | Status: AC

## 2020-01-20 MED ORDER — ROCURONIUM BROMIDE 10 MG/ML (PF) SYRINGE
PREFILLED_SYRINGE | INTRAVENOUS | Status: AC
  Filled 2020-01-20: qty 10

## 2020-01-20 MED ORDER — ONDANSETRON HCL 4 MG/2ML IJ SOLN
INTRAMUSCULAR | Status: DC | PRN
  Administered 2020-01-20: 4 mg via INTRAVENOUS

## 2020-01-20 MED ORDER — SODIUM CHLORIDE 0.9 % IV SOLN
INTRAVENOUS | Status: AC | PRN
  Administered 2020-01-20: 500 mL via INTRAMUSCULAR

## 2020-01-20 MED ORDER — LIDOCAINE HCL (PF) 1 % IJ SOLN
INTRAMUSCULAR | Status: AC
  Filled 2020-01-20: qty 30

## 2020-01-20 MED ORDER — DEXAMETHASONE SODIUM PHOSPHATE 10 MG/ML IJ SOLN
INTRAMUSCULAR | Status: AC
  Filled 2020-01-20: qty 1

## 2020-01-20 MED ORDER — HEPARIN SODIUM (PORCINE) 1000 UNIT/ML IJ SOLN
INTRAMUSCULAR | Status: AC
  Filled 2020-01-20: qty 4

## 2020-01-20 MED ORDER — OXYCODONE-ACETAMINOPHEN 5-325 MG PO TABS
1.0000 | ORAL_TABLET | ORAL | 0 refills | Status: DC | PRN
Start: 2020-01-20 — End: 2020-12-11

## 2020-01-20 MED ORDER — SEVOFLURANE IN SOLN
RESPIRATORY_TRACT | Status: AC
  Filled 2020-01-20: qty 250

## 2020-01-20 MED ORDER — ONDANSETRON HCL 4 MG/2ML IJ SOLN: 4.0000 mg | Freq: Once | INTRAMUSCULAR | Status: DC | PRN

## 2020-01-20 MED ORDER — ONDANSETRON HCL 4 MG/2ML IJ SOLN
INTRAMUSCULAR | Status: AC
  Filled 2020-01-20: qty 6

## 2020-01-20 MED ORDER — DAPTOMYCIN IV (FOR PTA / DISCHARGE USE ONLY): 650.0000 mg | INTRAVENOUS | 0 refills | Status: DC

## 2020-01-20 MED ORDER — LIDOCAINE 2% (20 MG/ML) 5 ML SYRINGE
INTRAMUSCULAR | Status: AC
  Filled 2020-01-20: qty 15

## 2020-01-20 MED ORDER — LIDOCAINE HCL 1 % IJ SOLN
INTRAMUSCULAR | Status: DC | PRN
  Administered 2020-01-20: 9 mL

## 2020-01-20 MED ORDER — FENTANYL CITRATE (PF) 100 MCG/2ML IJ SOLN
INTRAMUSCULAR | Status: AC
  Filled 2020-01-20: qty 2

## 2020-01-20 MED ORDER — PROPOFOL 10 MG/ML IV BOLUS
INTRAVENOUS | Status: AC
  Filled 2020-01-20: qty 20

## 2020-01-20 MED ORDER — KETOROLAC TROMETHAMINE 30 MG/ML IJ SOLN
INTRAMUSCULAR | Status: AC
  Filled 2020-01-20: qty 1

## 2020-01-20 MED ORDER — HEPARIN SOD (PORK) LOCK FLUSH 100 UNIT/ML IV SOLN
INTRAVENOUS | Status: AC
  Filled 2020-01-20: qty 5

## 2020-01-20 MED ORDER — PHENYLEPHRINE 40 MCG/ML (10ML) SYRINGE FOR IV PUSH (FOR BLOOD PRESSURE SUPPORT)
PREFILLED_SYRINGE | INTRAVENOUS | Status: AC
  Filled 2020-01-20: qty 20

## 2020-01-20 MED ORDER — HEPARIN SOD (PORK) LOCK FLUSH 100 UNIT/ML IV SOLN
INTRAVENOUS | Status: DC | PRN
  Administered 2020-01-20 (×2): 500 [IU] via INTRAVENOUS

## 2020-01-20 MED ORDER — ORAL CARE MOUTH RINSE: 15.0000 mL | Freq: Once | OROMUCOSAL | Status: DC

## 2020-01-20 MED ORDER — CYCLOBENZAPRINE HCL 5 MG PO TABS: 10.0000 mg | ORAL_TABLET | Freq: Three times a day (TID) | ORAL | 0 refills | Status: DC | PRN

## 2020-01-20 MED ORDER — CHLORHEXIDINE GLUCONATE 0.12 % MT SOLN: 15.0000 mL | Freq: Once | OROMUCOSAL | Status: DC

## 2020-01-20 MED ORDER — FENTANYL CITRATE (PF) 100 MCG/2ML IJ SOLN
INTRAMUSCULAR | Status: DC | PRN
Start: 2020-01-20 — End: 2020-01-20
  Administered 2020-01-20: 50 ug via INTRAVENOUS

## 2020-01-20 MED ORDER — PROPOFOL 10 MG/ML IV BOLUS
INTRAVENOUS | Status: DC | PRN
  Administered 2020-01-20: 180 mg via INTRAVENOUS

## 2020-01-20 MED ORDER — FENTANYL CITRATE (PF) 100 MCG/2ML IJ SOLN: 25.0000 ug | INTRAMUSCULAR | Status: DC | PRN

## 2020-01-20 MED ORDER — LIDOCAINE HCL (CARDIAC) PF 50 MG/5ML IV SOSY
PREFILLED_SYRINGE | INTRAVENOUS | Status: DC | PRN
  Administered 2020-01-20: 60 mg via INTRAVENOUS

## 2020-01-20 SURGICAL SUPPLY — 44 items
APPLICATOR CHLORAPREP 10.5 ORG (MISCELLANEOUS) ×2 IMPLANT
BAG DECANTER FOR FLEXI CONT (MISCELLANEOUS) ×2 IMPLANT
BIOPATCH RED 1 DISK 7.0 (GAUZE/BANDAGES/DRESSINGS) ×2 IMPLANT
COVER LIGHT HANDLE STERIS (MISCELLANEOUS) ×4 IMPLANT
COVER PROBE U/S 5X48 (MISCELLANEOUS) ×2 IMPLANT
COVER WAND RF STERILE (DRAPES) ×2 IMPLANT
DECANTER SPIKE VIAL GLASS SM (MISCELLANEOUS) ×4 IMPLANT
DERMABOND ADVANCED (GAUZE/BANDAGES/DRESSINGS) ×1
DERMABOND ADVANCED .7 DNX12 (GAUZE/BANDAGES/DRESSINGS) ×1 IMPLANT
DRAPE C-ARM FOLDED MOBILE STRL (DRAPES) ×2 IMPLANT
DRAPE CHEST BREAST 15X10 FENES (DRAPES) ×2 IMPLANT
ELECT REM PT RETURN 9FT ADLT (ELECTROSURGICAL) ×2
ELECTRODE REM PT RTRN 9FT ADLT (ELECTROSURGICAL) ×1 IMPLANT
GAUZE 4X4 16PLY RFD (DISPOSABLE) ×2 IMPLANT
GEL ULTRASOUND 20GR AQUASONIC (MISCELLANEOUS) ×2 IMPLANT
GLOVE BIO SURGEON STRL SZ 6.5 (GLOVE) ×2 IMPLANT
GLOVE BIOGEL PI IND STRL 6.5 (GLOVE) ×1 IMPLANT
GLOVE BIOGEL PI IND STRL 7.0 (GLOVE) ×2 IMPLANT
GLOVE BIOGEL PI INDICATOR 6.5 (GLOVE) ×1
GLOVE BIOGEL PI INDICATOR 7.0 (GLOVE) ×2
GOWN STRL REUS W/TWL LRG LVL3 (GOWN DISPOSABLE) ×4 IMPLANT
GOWN STRL REUS W/TWL XL LVL3 (GOWN DISPOSABLE) ×2 IMPLANT
IV NS 500ML (IV SOLUTION) ×1
IV NS 500ML BAXH (IV SOLUTION) ×1 IMPLANT
KIT BLADEGUARD II DBL (SET/KITS/TRAYS/PACK) ×2 IMPLANT
KIT CVC 2 LUMEN POWERLINE 6FR (CATHETERS) ×1 IMPLANT
KIT DUAL LUMEN POWERLINE 6FR (CATHETERS) ×1
KIT TURNOVER KIT A (KITS) ×2 IMPLANT
MARKER SKIN DUAL TIP RULER LAB (MISCELLANEOUS) ×2 IMPLANT
NEEDLE HYPO 18GX1.5 BLUNT FILL (NEEDLE) ×2 IMPLANT
NEEDLE HYPO 25X1 1.5 SAFETY (NEEDLE) ×2 IMPLANT
PACK BASIC III (CUSTOM PROCEDURE TRAY) ×1
PACK SRG BSC III STRL LF ECLPS (CUSTOM PROCEDURE TRAY) ×1 IMPLANT
PAD ARMBOARD 7.5X6 YLW CONV (MISCELLANEOUS) ×4 IMPLANT
PENCIL SMOKE EVACUATOR COATED (MISCELLANEOUS) ×2 IMPLANT
Powerline (Catheter) ×2 IMPLANT
SET BASIN LINEN APH (SET/KITS/TRAYS/PACK) ×2 IMPLANT
SUT MNCRL AB 4-0 PS2 18 (SUTURE) ×2 IMPLANT
SUT SILK 2 0 FSL 18 (SUTURE) ×2 IMPLANT
SUT VIC AB 3-0 SH 27 (SUTURE) ×1
SUT VIC AB 3-0 SH 27X BRD (SUTURE) ×1 IMPLANT
SYR 20ML LL LF (SYRINGE) ×2 IMPLANT
SYR 5ML LL (SYRINGE) ×2 IMPLANT
SYR CONTROL 10ML LL (SYRINGE) ×2 IMPLANT

## 2020-01-20 NOTE — Progress Notes (Signed)
[facility]  CXR with tunneled line in R IJ. No ptx. Tip in SVC.   Curlene Labrum, MD [facility] [address] [place], [place] [address] [phone] (office)

## 2020-01-20 NOTE — Progress Notes (Addendum)
PHARMACY CONSULT NOTE FOR: daptomycin  OUTPATIENT  PARENTERAL ANTIBIOTIC THERAPY (OPAT)  Indication: osteomyelitis Regimen: daptomycin 650mg  IV q24h (8mg /kg/day based on Adj IBW of 81.3kg) End date: 02/24/2019   IV antibiotic discharge orders are pended. To discharging provider:  please sign these orders via discharge navigator,  Select New Orders & click on the button choice - Manage This Unsigned Work.     Thank you for allowing pharmacy to be a part of this patient's care.  Despina Pole 01/20/2020, 12:19 PM

## 2020-01-20 NOTE — Consult Note (Addendum)
[facility] Consult  Reason for Consult: Tunneled central line placement for antibiotics  Referring Physician:  Dr. Carles Collet   Chief Complaint    Claudication      HPI: Raymond [name] is a 59 y.o. male with CKD s/p renal transplant, HTN, DM who came in with osteomyelitis of the right first toe and bacteremia with MRSA. He is going to need antibiotics as an outpatient, and given his renal transplant history, fistula on the left arm and nephrology not wanting a PICC placed, the patient will need a tunneled central line placed.  HE reports having his transplant 4 years go in [place] and says that he did dialysis via his fistula for 6 years prior.  He says he has had a line in the left side before.  Repeat blood cultures have been negative to date.   Past Medical History:  Diagnosis Date  . Chronic kidney disease    Awaiting transplant  . Diabetes ([place])   . Diabetes mellitus without complication ([place])   . Hypertension   . Renal disorder   . Renal insufficiency     Past Surgical History:  Procedure Laterality Date  . COLONOSCOPY N/A 12/05/2014   WGN:FAOZHYQM external and internal hemorrhoid/mild diverticulosis/11 polyps removed  . ESOPHAGOGASTRODUODENOSCOPY N/A 12/05/2014   VHQ:IONG duodenitis  . IR FLUORO GUIDE CV LINE RIGHT  03/01/2019  . IR REMOVAL TUN CV CATH W/O FL  05/10/2019  . IR US GUIDE VASC ACCESS RIGHT  03/01/2019  . KIDNEY TRANSPLANT  03/2015  . TEE WITHOUT CARDIOVERSION N/A 01/17/2020   Procedure: TRANSESOPHAGEAL ECHOCARDIOGRAM (TEE) WITH PROPOFOL;  Surgeon: Arnoldo Lenis, MD;  Location: [facility];  Service: Endoscopy;  Laterality: N/A;    Family History  Problem Relation Age of Onset  . Diabetes Mother   . Heart disease Father   . Colon cancer Neg Hx     Social History   Tobacco Use  . Smoking status: Never Smoker  . Smokeless tobacco: Never Used  Vaping Use  . Vaping Use: Never used  Substance Use Topics  . Alcohol use: No     Alcohol/week: 0.0 standard drinks  . Drug use: No    Medications: I have reviewed the patient's current medications. Current Facility-Administered Medications  Medication Dose Route Frequency Provider Last Rate Last Admin  . 0.9 %  sodium chloride infusion   Intravenous Continuous Elgergawy, Silver Huguenin, MD 75 mL/hr at 01/17/20 2247 New Bag at 01/17/20 2247  . 0.9 %  sodium chloride infusion   Intravenous Continuous Elgergawy, Silver Huguenin, MD 75 mL/hr at 01/19/20 0400 New Bag at 01/19/20 0400  . [MAR Hold] amLODipine (NORVASC) tablet 5 mg  5 mg Oral Daily Elgergawy, Silver Huguenin, MD   5 mg at 01/20/20 0900  . [MAR Hold] aspirin EC tablet 162 mg  162 mg Oral Daily Barton Dubois, MD   162 mg at 01/20/20 0900  . chlorhexidine (PERIDEX) 0.12 % solution 15 mL  15 mL Mouth/Throat Once Louann Sjogren, MD       Or  . MEDLINE mouth rinse  15 mL Mouth Rinse Once Louann Sjogren, MD      . Doug Sou Hold] cyclobenzaprine (FLEXERIL) tablet 5 mg  5 mg Oral TID PRN Orson Eva, MD   5 mg at 01/20/20 0139  . [MAR Hold] DAPTOmycin (CUBICIN) 650 mg in sodium chloride 0.9 % IVPB  650 mg Intravenous Q24H Efraim Kaufmann, RPH 226 mL/hr at 01/19/20 2238 650 mg at 01/19/20 2238  . [  MAR Hold] doxazosin (CARDURA) tablet 4 mg  4 mg Oral Daily Elgergawy, Silver Huguenin, MD   4 mg at 01/20/20 0859  . fentaNYL (SUBLIMAZE) injection 25-50 mcg  25-50 mcg Intravenous Q5 min PRN Louann Sjogren, MD      . Doug Sou Hold] hydrALAZINE (APRESOLINE) tablet 25 mg  25 mg Oral BID Elgergawy, Silver Huguenin, MD   25 mg at 01/20/20 0900  . [MAR Hold] insulin aspart (novoLOG) injection 0-15 Units  0-15 Units Subcutaneous TID WC Elgergawy, Silver Huguenin, MD   2 Units at 01/20/20 0901  . [MAR Hold] insulin aspart (novoLOG) injection 0-5 Units  0-5 Units Subcutaneous QHS Elgergawy, Silver Huguenin, MD   2 Units at 01/14/20 2237  . [MAR Hold] insulin aspart (novoLOG) injection 4 Units  4 Units Subcutaneous TID WC Barton Dubois, MD   4 Units at 01/19/20 1729  . [MAR Hold]  insulin glargine (LANTUS) injection 30 Units  30 Units Subcutaneous BID Barton Dubois, MD   30 Units at 01/20/20 0900  . lactated ringers infusion   Intravenous Continuous Louann Sjogren, MD      . Doug Sou Hold] multivitamin (RENA-VIT) tablet 1 tablet  1 tablet Oral Daily Elgergawy, Silver Huguenin, MD   1 tablet at 01/20/20 0859  . ondansetron (ZOFRAN) injection 4 mg  4 mg Intravenous Once PRN Louann Sjogren, MD      . Doug Sou Hold] oxyCODONE-acetaminophen (PERCOCET/ROXICET) 5-325 MG per tablet 1 tablet  1 tablet Oral Q4H PRN Aviva Signs, MD   1 tablet at 01/20/20 1245  . [MAR Hold] pantoprazole (PROTONIX) EC tablet 40 mg  40 mg Oral Daily Barton Dubois, MD   40 mg at 01/20/20 0859  . [MAR Hold] povidone-Iodine (BETADINE) 5 % topical solution   Topical BID Orson Eva, MD   Given at 01/20/20 1031  . [MAR Hold] tacrolimus (PROGRAF) capsule 3 mg  3 mg Oral BID Elgergawy, Silver Huguenin, MD   3 mg at 01/20/20 0901  . [MAR Hold] Vitamin D (Ergocalciferol) (DRISDOL) capsule 50,000 Units  50,000 Units Oral Q30 days Elgergawy, Silver Huguenin, MD   50,000 Units at 01/13/20 1349    No Known Allergies   ROS:  A comprehensive review of systems was negative except for: Musculoskeletal: positive for leg swelling, right foot infection  Blood pressure (!) 185/83, pulse 66, temperature 97.8 F (36.6 C), temperature source Oral, resp. rate 16, height 5\' 9"  (1.753 m), weight 97.1 kg, SpO2 96 %. Physical Exam Vitals reviewed.  Constitutional:      Appearance: He is normal weight.  HENT:     Head: Normocephalic.     Nose: Nose normal.  Eyes:     Pupils: Pupils are equal, round, and reactive to light.  Neck:     Comments: Right jugular vein seen on [place], wide and patent Cardiovascular:     Rate and Rhythm: Normal rate.  Pulmonary:     Effort: Pulmonary effort is normal.  Abdominal:     General: There is no distension.     Palpations: Abdomen is soft.     Tenderness: There is no abdominal tenderness.  Musculoskeletal:      Cervical back: Normal range of motion.     Right lower leg: Edema present.     Left lower leg: Edema present.     Comments: Right foot wrapped in gauze  Skin:    General: Skin is warm.  Neurological:     General: No focal deficit present.  Mental Status: He is alert and oriented to person, place, and time.  Psychiatric:        Mood and Affect: Mood normal.        Behavior: Behavior normal.        Thought Content: Thought content normal.        Judgment: Judgment normal.     Results: Results for orders placed or performed during the hospital encounter of 01/12/20 (from the past 48 hour(s))  Glucose, capillary     Status: Abnormal   Collection Time: 01/18/20  4:50 PM  Result Value Ref Range   Glucose-Capillary 123 (H) 70 - 99 mg/dL    Comment: Glucose reference range applies only to samples taken after fasting for at least 8 hours.  Glucose, capillary     Status: Abnormal   Collection Time: 01/18/20  9:00 PM  Result Value Ref Range   Glucose-Capillary 150 (H) 70 - 99 mg/dL    Comment: Glucose reference range applies only to samples taken after fasting for at least 8 hours.  Glucose, capillary     Status: Abnormal   Collection Time: 01/19/20  8:00 AM  Result Value Ref Range   Glucose-Capillary 123 (H) 70 - 99 mg/dL    Comment: Glucose reference range applies only to samples taken after fasting for at least 8 hours.  Basic metabolic panel     Status: Abnormal   Collection Time: 01/19/20  8:01 AM  Result Value Ref Range   Sodium 136 135 - 145 mmol/L   Potassium 4.6 3.5 - 5.1 mmol/L   Chloride 108 98 - 111 mmol/L   CO2 22 22 - 32 mmol/L   Glucose, Bld 140 (H) 70 - 99 mg/dL    Comment: Glucose reference range applies only to samples taken after fasting for at least 8 hours.   BUN 22 (H) 6 - 20 mg/dL   Creatinine, Ser 1.09 0.61 - 1.24 mg/dL   Calcium 9.1 8.9 - 10.3 mg/dL   GFR calc non Af Amer >60 >60 mL/min   GFR calc Af Amer >60 >60 mL/min   Anion gap 6 5 - 15     Comment: Performed at [facility], [address] [place], [place] [address]  C-reactive protein     Status: Abnormal   Collection Time: 01/19/20  8:01 AM  Result Value Ref Range   CRP 7.0 (H) <1.0 mg/dL    Comment: Performed at [facility], [address] [place], [place] [address]  Sedimentation rate     Status: Abnormal   Collection Time: 01/19/20  8:01 AM  Result Value Ref Range   Sed Rate 90 (H) 0 - 16 mm/hr    Comment: Performed at [facility], [address] [place], [place] [address]  CBC     Status: Abnormal   Collection Time: 01/19/20  8:01 AM  Result Value Ref Range   WBC 5.8 4.0 - 10.5 K/uL   RBC 4.77 4.22 - 5.81 MIL/uL   Hemoglobin 12.1 (L) 13.0 - 17.0 g/dL   HCT 41.0 39 - 52 %   MCV 86.0 80.0 - 100.0 fL   MCH 25.4 (L) 26.0 - 34.0 pg   MCHC 29.5 (L) 30.0 - 36.0 g/dL   RDW 14.6 11.5 - 15.5 %   Platelets 176 150 - 400 K/uL   nRBC 0.0 0.0 - 0.2 %    Comment: Performed at [facility], [address], [place], [place] [address]  Glucose, capillary  Status: Abnormal   Collection Time: 01/19/20 11:46 AM  Result Value Ref Range   Glucose-Capillary 211 (H) 70 - 99 mg/dL    Comment: Glucose reference range applies only to samples taken after fasting for at least 8 hours.  Glucose, capillary     Status: Abnormal   Collection Time: 01/19/20  5:21 PM  Result Value Ref Range   Glucose-Capillary 132 (H) 70 - 99 mg/dL    Comment: Glucose reference range applies only to samples taken after fasting for at least 8 hours.  Glucose, capillary     Status: None   Collection Time: 01/19/20  9:38 PM  Result Value Ref Range   Glucose-Capillary 97 70 - 99 mg/dL    Comment: Glucose reference range applies only to samples taken after fasting for at least 8 hours.  CK     Status: None   Collection Time: 01/20/20  5:10 AM  Result Value Ref Range   Total CK 61 49.0 - 397.0 U/L    Comment: Performed at [facility], [address], [place], [place] [address]  Basic metabolic panel      Status: Abnormal   Collection Time: 01/20/20  5:10 AM  Result Value Ref Range   Sodium 137 135 - 145 mmol/L   Potassium 5.1 3.5 - 5.1 mmol/L   Chloride 110 98 - 111 mmol/L   CO2 20 (L) 22 - 32 mmol/L   Glucose, Bld 163 (H) 70 - 99 mg/dL    Comment: Glucose reference range applies only to samples taken after fasting for at least 8 hours.   BUN 23 (H) 6 - 20 mg/dL   Creatinine, Ser 1.14 0.61 - 1.24 mg/dL   Calcium 9.2 8.9 - 10.3 mg/dL   GFR calc non Af Amer >60 >60 mL/min   GFR calc Af Amer >60 >60 mL/min   Anion gap 7 5 - 15    Comment: Performed at [facility], [address], [place], [place] [address]  CBC     Status: Abnormal   Collection Time: 01/20/20  5:10 AM  Result Value Ref Range   WBC 7.7 4.0 - 10.5 K/uL   RBC 4.93 4.22 - 5.81 MIL/uL   Hemoglobin 12.5 (L) 13.0 - 17.0 g/dL   HCT 41.7 39 - 52 %   MCV 84.6 80.0 - 100.0 fL   MCH 25.4 (L) 26.0 - 34.0 pg   MCHC 30.0 30.0 - 36.0 g/dL   RDW 14.5 11.5 - 15.5 %   Platelets 195 150 - 400 K/uL   nRBC 0.0 0.0 - 0.2 %    Comment: Performed at [facility], [address], [place], [place] [address]  Glucose, capillary     Status: Abnormal   Collection Time: 01/20/20  7:40 AM  Result Value Ref Range   Glucose-Capillary 144 (H) 70 - 99 mg/dL    Comment: Glucose reference range applies only to samples taken after fasting for at least 8 hours.  Glucose, capillary     Status: None   Collection Time: 01/20/20 11:47 AM  Result Value Ref Range   Glucose-Capillary 71 70 - 99 mg/dL    Comment: Glucose reference range applies only to samples taken after fasting for at least 8 hours.  Glucose, capillary     Status: Abnormal   Collection Time: 01/20/20  1:25 PM  Result Value Ref Range   Glucose-Capillary 122 (H) 70 - 99 mg/dL    Comment: Glucose reference range applies only to samples taken after  fasting for at least 8 hours.    MR CERVICAL SPINE WO CONTRAST  Result Date: 01/19/2020 CLINICAL DATA:  Severe pain, bacteremia EXAM: MRI  CERVICAL SPINE WITHOUT CONTRAST TECHNIQUE: Multiplanar, multisequence MR imaging of the cervical spine was performed. No intravenous contrast was administered. COMPARISON:  None. FINDINGS: Motion artifact is present. Alignment: Anteroposterior alignment is maintained. Vertebrae: Vertebral body heights are preserved. There is no marrow edema. No suspicious osseous lesion. Cord: No abnormal signal within the above limitation. Posterior Fossa, vertebral arteries, paraspinal tissues: Unremarkable. Disc levels: No disc edema. C2-C3:  Minimal disc bulge.  No canal or foraminal stenosis. C3-C4: Minimal disc bulge small endplate osteophytes. No canal or foraminal stenosis. C4-C5: Minimal disc bulge with small endplate osteophytes. No canal or foraminal stenosis. C5-C6: Minimal disc bulge with small endplate osteophytes. No canal or foraminal stenosis. C6-C7: Minimal disc bulge with small endplate osteophytes. No canal or foraminal stenosis. C7-T1:  No canal or foraminal stenosis. IMPRESSION: No evidence of osteomyelitis/discitis.  No epidural collection. Minor degenerative changes without high-grade stenosis. Electronically Signed   By: Macy Mis M.D.   On: 01/19/2020 17:01     Assessment & Plan:  Raymond [name] is a 59 y.o. male with MRSA bacteremia, osteomyelitis who is being managed by his podiatrist. He needs a tunneled CVL for discharge home.   -Discussed risk of bleeding, infection, pneumothorax and need to removal at the hospital procedure room post procedure if he comes back to me. Discussed use of fluoroscopy and ultrasound guidance. Discussed CXR after placement.  -INR not done but has normal liver test, platelet normal  -Patient receiving q24 antibiotics, daptomycin and does not need additional coverage preop, confirmed with pharmacy  -Hickman 11F dual lumen catheter from [facility] sent up   All questions were answered to the satisfaction of the patient.    Virl Cagey 01/20/2020, 2:08  PM

## 2020-01-20 NOTE — Anesthesia Preprocedure Evaluation (Signed)
Anesthesia Evaluation  Patient identified by MRN, date of birth, ID band Patient awake    Reviewed: Allergy & Precautions, H&P , NPO status , Patient's Chart, lab work & pertinent test results, reviewed documented beta blocker date and time   Airway Mallampati: II  TM Distance: >3 FB Neck ROM: full    Dental no notable dental hx. (+) Teeth Intact   Pulmonary neg pulmonary ROS,    Pulmonary exam normal breath sounds clear to auscultation       Cardiovascular Exercise Tolerance: Good hypertension, + Peripheral Vascular Disease   Rhythm:regular Rate:Normal     Neuro/Psych negative neurological ROS  negative psych ROS   GI/Hepatic Neg liver ROS, GERD  Medicated,  Endo/Other  negative endocrine ROSdiabetes, Type 2  Renal/GU Renal disease  negative genitourinary   Musculoskeletal   Abdominal   Peds  Hematology negative hematology ROS (+)   Anesthesia Other Findings   Reproductive/Obstetrics negative OB ROS                             Anesthesia Physical  Anesthesia Plan  ASA: III  Anesthesia Plan: General   Post-op Pain Management:    Induction:   PONV Risk Score and Plan: 2 and Propofol infusion  Airway Management Planned:   Additional Equipment:   Intra-op Plan:   Post-operative Plan:   Informed Consent: I have reviewed the patients History and Physical, chart, labs and discussed the procedure including the risks, benefits and alternatives for the proposed anesthesia with the patient or authorized representative who has indicated his/her understanding and acceptance.     Dental Advisory Given  Plan Discussed with: CRNA  Anesthesia Plan Comments:         Anesthesia Quick Evaluation

## 2020-01-20 NOTE — Anesthesia Procedure Notes (Signed)
Procedure Name: LMA Insertion Date/Time: 01/20/2020 1:38 PM Performed by: Ollen Bowl, CRNA Pre-anesthesia Checklist: Patient identified, Patient being monitored, Emergency Drugs available, Timeout performed and Suction available Patient Re-evaluated:Patient Re-evaluated prior to induction Oxygen Delivery Method: Circle System Utilized Preoxygenation: Pre-oxygenation with 100% oxygen Induction Type: IV induction Ventilation: Mask ventilation without difficulty LMA: LMA inserted LMA Size: 5.0 Number of attempts: 1 Placement Confirmation: positive ETCO2 and breath sounds checked- equal and bilateral

## 2020-01-20 NOTE — Op Note (Signed)
Operative Note 01/20/20   Preoperative Diagnosis: MRSA bacteremia, osteomyelitis     Postoperative Diagnosis: Same   Procedure(s) Performed: Tunneled Central Line Placement, Right Internal Jugular- Dual Lumen 6 French Bard Hickman Catheter    Surgeon: Lanell Matar. Constance Haw, MD   Assistants: No qualified resident was available   Anesthesia: General anesthesia with LMA   Anesthesiologist: Louann Sjogren, MD    Specimens: None   Estimated Blood Loss: Minimal   Fluoroscopy time: 1 minute 28 seconds   Blood Replacement: None    Complications: None    Operative Findings: Normal anatomy   Indications: Raymond [name] is a 59 yo with CKD, renal transplant who has osteomyelitis and bacteremia with MRSA. He needs antibiotics at home. He cannot get a PICC line and needs a tunneled central line. Discussed the risk of the procedure including but not limited to bleeding, infection, pneumothorax, injury to vessels, and he opted to proceed.   Procedure: The patient was brought into the operating room and general anesthesia with LMA was induced.   The right chest and neck was prepped and draped in the usual sterile fashion.  Preoperative antibiotics were not given due to his daptomycin lasting 24 hours per pharmacy.   An Ultrasound was used to verify that the right internal jugular vein was patent.  One percent lidocaine was used for local anesthesia.  The patient was measured and at about 28 cm catheter length. The needle was advanced into the right internal jugular vein using the Seldinger technique without difficulty.  A guidewire was then advanced into the right atrium under fluoroscopic guidance.  Ectopia was not noted. The wire was readjusted to enter into the inferior vena cava.  The wire was secured.  An incision was made over the right chest and the catheter was tunneled to the neck.  The ultrasound again confirmed the wire was going into the vein only. An introducer and peel-away sheath were  placed over the guidewire. The catheter was then inserted through the peel-away sheath and the peel-away sheath was removed.  A spot film was performed to confirm the position.  The catheter at first would not draw back or flush. It appeared to be going into the azygos and had a kink at the neck. The kink the neck was smoothed out, and the stylet wire was used to redirect the catheter back out of the azygos.  The catheter then drew back but with some resistance, but flushed very easily. The lumens were flushed with a  Heparin flush. The neck incision was closed with 4-0 Monocryl and Dermabond. The catheter was secured with 2-0 silk suture and a sterile Biopatch and dressing was applied.  Hemostasis was confirmed.     All tape and needle counts were correct at the end of the procedure. The patient was transferred to PACU in stable condition. A chest x-ray will be performed at that time.  Curlene Labrum, MD [facility] [address] [place], [place] [address] [phone] (office)

## 2020-01-20 NOTE — Discharge Summary (Signed)
Physician Discharge Summary  Raymond [name] RWE:[id number] DOB: 12/29/1960 DOA: 01/12/2020  PCP: [name], FNP  Admit date: 01/12/2020 Discharge date: 01/20/2020  Admitted From: Home Disposition:  Home   Recommendations for Outpatient Follow-up:  1. Follow up with PCP in 1-2 weeks 2. Please obtain BMP/CBC in one week 3. Continue daptomycin through 02/24/20 4. Weekly CPK, CRP, ESR, CBC, BMP 5. Follow up with Dr. Baxter Flattery in 1-2 weeks 6. routine central line care--will need surgical removal once cleared by ID   Home Health: yes Equipment/Devices: daptomycin x 6 weeks (last day on 02/24/20)  Discharge Condition: Stable CODE STATUS: FULL Diet recommendation: Heart Healthy   Brief/Interim Summary: As per H&P written by Dr. 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, it was sent by podiatry for evaluation for wound in the right plantar foot, it was seen today by podiatry, and he had a concern of worsening foot infection, so he was sent to ED for further evaluation, patient reports symptoms has been going on for last 2 weeks, and currently he is with purulent foul-smelling odor, as well there was a concern for possible DVT in his right lower extremity given swelling, patient was at [facility] ED yesterday, and he left prior to getting his imaging study done, labs during that visit showing elevated D-dimers greater than 19, patient himself denies any chest pain, palpitation, dizziness, he does report mild dyspnea, report he has been compliant with his Lantus and glipizide. - in [place] significant for pseudohyponatremia sodium of 128, creatinine of 2.1 from baseline 1.3, glucose of 609, but normal anion gap, and bicarb of 21, had low platelet at 81, negative, he is unvaccinated, started on vancomycin,  [facility] hospitalist consulted to admit. He was switched to daptomycin.  Blood cultures showed MRSA bacteremia.  Repeat blood cultures were negative and TEE was negative. General surgery was consulted.  They did not feel the patient needed operative management.  MR of right foot showed a fluid collection about the first MTP joint and "osteonecrosis".  I remained concerned regarding poor source controlled for the patient's MRSA bacteremia. Therefore, the patient will be transferred to [facility] to see his podiatrist for possible operative management.  I spoke with Dr. Celesta Gentile, the patient's podiatrist who agreed to see patient after transfer to [facility].   On 01/20/20, I was notified that transfer to [facility] was not possible due to bed availability.  I subsequently discussed the patient's care again with the patient's ID specialist given the new circumstances and inability to transfer the patient.  Dr. Baxter Flattery felt that since the patient was stable and repeat blood cultures remain negative, that the patient could be discharged home with 6 weeks of IV daptomycin.  Dr. Baxter Flattery would set up close outpatient follow up and possible first ray amputation in the future. This was dicussed with the patient and spouse with which they agreed.  General surgery was consulted and placed a tunneled central venous line for outpatient abx.  Discharge Diagnoses:  right diabetic foot ulcer/MRSA bacteremia:  -presentedOpen wound with purulent discharge -Present for the last 2 weeks and worsening -had 8 weeks IV daptomycin 02/2019 to 04/2019 -underlying history immunosuppressant due to renal transplant status.  -Continue Daptomycin. -Repeat blood cultures (8/20 and 8/22)--neg x 5 days -TEE--negative -MRI suggesting developing abscess and osteonecrosis; general surgery has been consulted -case discussed with general surgery-->feels source  control obtained as drainage is improved -However, I do not feel patient has  adequate source control of his MRSA bacteremia -I spoke with pt's podiatrist (Dr. Jacqualyn Posey) who agreed to see patient after transfer to [facility] -I also notified ID, Dr. Baxter Flattery -01/17/20--TEE--neg for vegetation (EF 55-60%), No WMA -Tunneled central line schedule for 01/20/20 by IR -check CRP 7.0 -ESR--90 -transfer to [facility] transfer canceled due to lack of bed availability -01/20/20--case discussed with patient's ID doc-->ok to d/c home with daptomycin x 6 weeks with consideration of first ray amputation in the future  Acute on chronic renal failure--CKD3a -s/p renal transplant -baseline creatinine 1.0-1.3 -Continue minimizing nephrotoxic agents -Follow renal function trend -Gentle fluid resuscitation provided-->creatinine back to baseline -Continue Prograf.  Immunosuppression -Continue Prograf  -History of renal transplant  Neck Pain -obtain MR of cervical spine--neg for osteomyelitis/discitis or abscess -improved with flexeril  Uncontrolled type 2 diabetes mellitus with hx of ESRD(prior to transplant) [facility]) -8/19/21A1c 13.7 -Continue adjusted dose of insulin (Lantus) and a sliding scale. -Follow CBGs--controlled -Close follow-up after discharge with patient's endocrinologist has been recommended.  Essential hypertension, benign -Stable overall -Continue current antihypertensive regimen. -Follow vital signs.  Class 1 obesity due to excess calories -body mass index (BMI) of 31.0 to 31.9 in adult -Low calorie diet, portion control and increase physical activity discussed with patient -Body mass index is 31.6 kg/m.  elevated D-dimer/right lower extremity superficial thrombophlebitis -[place] right leg--No DVT; +SVT R-short saphenous vein -VQ scan also negative for concerns of pulmonary embolism. -Continue supportive care, TED hoses usage and 142m of Aspirin.  -continue PRN analgesics.       Discharge Instructions  Discharge Instructions    [facility] pharmacist to adjust dose for Vancomycin, Aminoglycosides and other anti-infective therapies as requested by physician.   Complete by: As directed    [facility] to provide Cath Flo 229m  Complete by: As directed    Administer for PICC line occlusion and as ordered by physician for other access device issues.   Anaphylaxis Kit: Provided to treat any anaphylactic reaction to the medication being provided to the patient if First Dose or when requested by physician   Complete by: As directed    Epinephrine 72m9ml vial / amp: Administer 0.3mg37m.3ml)3mbcutaneously once for moderate to severe anaphylaxis, nurse to call physician and pharmacy when reaction occurs and call 911 if needed for immediate care   Diphenhydramine 50mg/72mV vial: Administer 25-50mg I7m PRN for first dose reaction, rash, itching, mild reaction, nurse to call physician and pharmacy when reaction occurs   Sodium Chloride 0.9% NS 500ml IV44mminister if needed for hypovolemic blood pressure drop or as ordered by physician after call to physician with anaphylactic reaction   Change dressing on IV access line weekly and PRN   Complete by: As directed    Flush IV access with Sodium Chloride 0.9% and Heparin 10 units/ml or 100 units/ml   Complete by: As directed    Home infusion instructions - [facility]   Complete by: As directed    Instructions: Flush IV access with Sodium Chloride 0.9% and Heparin 10units/ml or 100units/ml   Change dressing on IV access line: Weekly and PRN   Instructions Cath Flo 2mg: Adm52mster for PICC Line occlusion and as ordered by physician for other access device   [facility] pharmacist to adjust dose for: Vancomycin, Aminoglycosides and other anti-infective therapies as requested by physician   Method of administration may  be changed at the discretion of home infusion pharmacist based upon assessment of the patient and/or caregiver's ability to  self-administer the medication ordered   Complete by: As directed    Outpatient Parenteral Antibiotic Therapy Information Antibiotic: Daptomycin (Cubicin) IVPB; Indications for use: inject 650 mg once daily; End Date: 02/24/2020   Complete by: As directed    Antibiotic: Daptomycin (Cubicin) IVPB   Indications for use: inject 650 mg once daily   End Date: 02/24/2020     Allergies as of 01/20/2020   No Known Allergies     Medication List    TAKE these medications   Accu-Chek Guide Me w/Device Kit 1 Piece by Does not apply route as directed.   amLODipine 5 MG tablet Commonly known as: NORVASC Take 5 mg by mouth daily.   cyclobenzaprine 5 MG tablet Commonly known as: FLEXERIL Take 2 tablets (10 mg total) by mouth 3 (three) times daily as needed for muscle spasms.   daptomycin  IVPB Commonly known as: CUBICIN Inject 650 mg into the vein daily. Indication:  osteomyelitis First Dose: No Last Day of Therapy:  02/24/2020 Labs - Once weekly:  CBC/D, BMP, and CPK Labs - Every other week:  ESR and CRP Method of administration: IV Push Method of administration may be changed at the discretion of home infusion pharmacist based upon assessment of the patient and/or caregiver's ability to self-administer the medication ordered.   doxazosin 4 MG tablet Commonly known as: CARDURA Take 6 mg by mouth daily.   glipiZIDE 5 MG 24 hr tablet Commonly known as: GLUCOTROL XL Take 5 mg by mouth daily.   glucose blood test strip Commonly known as: Accu-Chek Guide Use as instructed 4 x daily. E11.65   hydrALAZINE 25 MG tablet Commonly known as: APRESOLINE Take 25 mg by mouth 2 (two) times daily.   hydrOXYzine 25 MG tablet Commonly known as: ATARAX/VISTARIL Take 25 mg by mouth 3 (three) times daily as needed.   Insulin Pen Needle 31G X 8 MM Misc Commonly known as: B-D ULTRAFINE III SHORT PEN 1 each by Does not apply route as directed.   Lantus SoloStar 100 UNIT/ML Solostar Pen Generic  drug: insulin glargine INJECT 60 UNITS SUBCUTANEOUSLY AT BEDTIME   multivitamin capsule Take 1 capsule by mouth daily.   oxyCODONE-acetaminophen 5-325 MG tablet Commonly known as: PERCOCET/ROXICET Take 1 tablet by mouth every 4 (four) hours as needed for severe pain.   sodium bicarbonate 650 MG tablet Take 1,300 mg by mouth 3 (three) times daily.   tacrolimus 1 MG capsule Commonly known as: PROGRAF Take 3 mg by mouth 2 (two) times daily. Take 3 capsules (38m) in AM & take 3 caps (357m in PM   tacrolimus 5 MG capsule Commonly known as: PROGRAF Take 1 mg by mouth 2 (two) times daily. 3   triamcinolone 0.025 % ointment Commonly known as: KENALOG Apply 1 application topically 3 (three) times daily.   Vitamin D (Ergocalciferol) 1.25 MG (50000 UNIT) Caps capsule Commonly known as: DRISDOL Take 50,000 Units by mouth every 30 (thirty) days.            Discharge Care Instructions  (From admission, onward)         Start     Ordered   01/20/20 0000  Change dressing on IV access line weekly and PRN  (Home infusion instructions - [facility] )        01/20/20 1227          No  Known Allergies  Consultations:  ID  General surgery    Procedures/Studies: MR CERVICAL SPINE WO CONTRAST  Result Date: 01/19/2020 CLINICAL DATA:  Severe pain, bacteremia EXAM: MRI CERVICAL SPINE WITHOUT CONTRAST TECHNIQUE: Multiplanar, multisequence MR imaging of the cervical spine was performed. No intravenous contrast was administered. COMPARISON:  None. FINDINGS: Motion artifact is present. Alignment: Anteroposterior alignment is maintained. Vertebrae: Vertebral body heights are preserved. There is no marrow edema. No suspicious osseous lesion. Cord: No abnormal signal within the above limitation. Posterior Fossa, vertebral arteries, paraspinal tissues: Unremarkable. Disc levels: No disc edema. C2-C3:  Minimal disc bulge.  No canal or foraminal stenosis. C3-C4: Minimal disc bulge  small endplate osteophytes. No canal or foraminal stenosis. C4-C5: Minimal disc bulge with small endplate osteophytes. No canal or foraminal stenosis. C5-C6: Minimal disc bulge with small endplate osteophytes. No canal or foraminal stenosis. C6-C7: Minimal disc bulge with small endplate osteophytes. No canal or foraminal stenosis. C7-T1:  No canal or foraminal stenosis. IMPRESSION: No evidence of osteomyelitis/discitis.  No epidural collection. Minor degenerative changes without high-grade stenosis. Electronically Signed   By: Macy Mis M.D.   On: 01/19/2020 17:01   NM Pulmonary Perfusion  Result Date: 01/13/2020 CLINICAL DATA:  Chest pain and shortness of breath with positive D-dimer EXAM: NUCLEAR MEDICINE PERFUSION LUNG SCAN TECHNIQUE: Perfusion images were obtained in multiple projections after intravenous injection of radiopharmaceutical. Views: Anterior, posterior, left lateral, right lateral, RPO, LPO, RAO, LAO RADIOPHARMACEUTICALS:  3.8 mCi Tc-31mMAA IV COMPARISON:  None. FINDINGS: Radiotracer uptake is homogeneous and symmetric bilaterally. No perfusion defects evident. IMPRESSION: No evident perfusion defects. No findings indicative of pulmonary embolus. Electronically Signed   By: WLowella GripIII M.D.   On: 01/13/2020 08:46   MR FOOT RIGHT WO CONTRAST  Result Date: 01/13/2020 CLINICAL DATA:  Diabetic foot ulcer EXAM: MRI OF THE RIGHT FOREFOOT WITHOUT CONTRAST TECHNIQUE: Multiplanar, multisequence MR imaging of the right foot was performed. No intravenous contrast was administered. COMPARISON:  X-ray 01/12/2020, 03/01/2018 FINDINGS: Bones/Joint/Cartilage Chronic changes to the first metatarsal head with subchondral collapse and subchondral cystic change. There is reactive sclerosis and subchondral cystic change of the opposing great toe proximal phalanx base, more pronounced along the medial aspect. Small first MTP joint effusion. The medial hallux sesamoid also appears sclerotic with  heterogeneous T2 hyperintense appearance which may reflect sequela of chronic sesamoiditis. Mild degenerative changes within the midfoot including at the second metatarsal base and within the lateral cuneiform. No acute fracture. No dislocation. No bony erosion or cortical destruction. Ligaments Intact Lisfranc ligament. Collateral ligaments of the forefoot appear intact. Muscles and Tendons Fatty atrophy of the intrinsic foot musculature suggesting chronic denervation changes. Intact flexor and extensor tendons without tenosynovitis. Soft tissues Plantar soft tissue ulceration underlies the medial forefoot at the level of the first MTP joint. Ulcer base is contiguous with a fluid and air containing collection measuring approximately 2.1 x 0.8 x 1.8 cm (series 9, image 20; series 5, image 16) suggesting a developing abscess. IMPRESSION: 1. Plantar soft tissue ulceration underlies the medial forefoot at the level of the first MTP joint. Ulcer base is contiguous with a fluid and air containing collection measuring 2.1 x 0.8 x 1.8 cm suggesting a developing abscess. 2. Chronic bony changes to the first metatarsal head and great toe proximal phalanx with subchondral collapse and subchondral cystic change suggesting osteonecrosis. Findings do not appear appreciably changed compared to multiple prior radiographs. Although felt to be less likely, signal changes related to osteomyelitis would  be difficult to entirely exclude. 3. Small first MTP joint effusion is likely reactive. Septic arthritis not excluded. 4. Additional findings suggestive of chronic sesamoiditis. Electronically Signed   By: Davina Poke D.O.   On: 01/13/2020 10:34   US Venous Img Lower Unilateral Right (DVT)  Result Date: 01/12/2020 CLINICAL DATA:  Calf and popliteal fossa pain x1 week, edema, redness EXAM: RIGHT LOWER EXTREMITY VENOUS DOPPLER ULTRASOUND TECHNIQUE: Gray-scale sonography with compression, as well as color and duplex ultrasound,  were performed to evaluate the deep venous system(s) from the level of the common femoral vein through the popliteal and proximal calf veins. COMPARISON:  None. FINDINGS: VENOUS Normal compressibility of the common femoral, superficial femoral, and popliteal veins, as well as the visualized calf veins. Visualized portions of profunda femoral vein and great saphenous vein unremarkable. No filling defects to suggest DVT on grayscale or color Doppler imaging. Doppler waveforms show normal direction of venous flow, normal respiratory phasicity and response to augmentation. Limited views of the contralateral common femoral vein are unremarkable. OTHER Hypoechoic thrombus in a noncompressible right short saphenous vein, and in the great saphenous vein below the knee. Hypoechoic right inguinal lymph node 1.1 cm short axis diameter. Limitations: none IMPRESSION: 1. Negative for DVT. 2. Superficial thrombophlebitis involving right short saphenous vein, and right GSV below the knee. Electronically Signed   By: Lucrezia Europe M.D.   On: 01/12/2020 16:11   US ARTERIAL ABI (SCREENING LOWER EXTREMITY)  Result Date: 01/13/2020 CLINICAL DATA:  59 year old male with a history of foot wound on right EXAM: NONINVASIVE PHYSIOLOGIC VASCULAR STUDY OF BILATERAL LOWER EXTREMITIES TECHNIQUE: Evaluation of both lower extremities was performed at rest, including calculation of ankle-brachial indices, multiple segmental pressure evaluation, segmental Doppler and segmental pulse volume recording. COMPARISON:  None. FINDINGS: Right ABI:  1.04 Left ABI:  1.09 Right Lower Extremity: Segmental Doppler at the right ankle demonstrates monophasic dorsalis pedis and posterior tibial artery Left Lower Extremity: Segmental Doppler on the left demonstrates triphasic posterior tibial artery and monophasic dorsalis pedis IMPRESSION: Right: Resting ABI within normal limits, though potentially falsely elevated. Segmental Doppler at the ankle demonstrates  monophasic waveforms, indicating more proximal occlusive disease. Left: Resting ABI within normal limits, though potentially falsely elevated. Segmental exam demonstrates waveform maintained in the posterior tibial artery and monophasic in the dorsalis pedis. Signed, Dulcy Fanny. Dellia Nims, RPVI [facility] Electronically Signed   By: Corrie Mckusick D.O.   On: 01/13/2020 13:49   DG Foot Complete Right  Result Date: 01/12/2020 CLINICAL DATA:  Diabetic foot infection. EXAM: RIGHT FOOT COMPLETE - 3+ VIEW COMPARISON:  April 05, 2019 FINDINGS: There is no evidence of an acute fracture or dislocation. A chronic fracture of the proximal phalanx of the fifth right toe is noted. There is a large plantar calcaneal spur. Moderate severity degenerative changes are seen involving the metatarsophalangeal joint of the right great toe. Moderate to marked severity vascular calcification is seen. A mild amount of soft tissue air is seen along the plantar aspect of the distal right foot. Mild to moderate severity associated soft tissue swelling is seen. IMPRESSION: 1. Mild amount of soft tissue air along the plantar aspect of the distal right foot. 2. Chronic and degenerative changes, as described above. Electronically Signed   By: Virgina Norfolk M.D.   On: 01/12/2020 16:35   ECHOCARDIOGRAM COMPLETE  Result Date: 01/14/2020    ECHOCARDIOGRAM REPORT   Patient Name:   DELEON PASSE Date of Exam:  01/14/2020 Medical Rec #:  [id number]       Height:       69.0 in Accession #:    [id number]      Weight:       214.0 lb Date of Birth:  Jul 10, 1960       BSA:          2.126 m Patient Age:    45 years        BP:           131/86 mmHg Patient Gender: M               HR:           76 bpm. Exam Location:  [facility] Procedure: 2D Echo Indications:    Bacteremia 790.7 / R78.81  History:        Patient has no prior history of Echocardiogram examinations.                 Risk  Factors:Dyslipidemia, Non-Smoker, Diabetes and                 Hypertension. Acute Kidney Injury, GERD.  Sonographer:    Leavy Cella RDCS (AE) Referring Phys: [place]  1. Left ventricular ejection fraction, by estimation, is 55 to 60%. The left ventricle has normal function. The left ventricle has no regional wall motion abnormalities. There is moderate left ventricular hypertrophy. Left ventricular diastolic parameters are indeterminate.  2. Right ventricular systolic function is normal. The right ventricular size is normal. There is normal pulmonary artery systolic pressure.  3. The mitral valve is normal in structure. No evidence of mitral valve regurgitation.  4. The aortic valve is tricuspid. Aortic valve regurgitation is not visualized. Mild aortic valve sclerosis is present, with no evidence of aortic valve stenosis.  5. The inferior vena cava is normal in size with greater than 50% respiratory variability, suggesting right atrial pressure of 3 mmHg. Conclusion(s)/Recommendation(s): No vegetation seen. If high clinical suspicion for endocarditis, consider TEE. FINDINGS  Left Ventricle: Left ventricular ejection fraction, by estimation, is 55 to 60%. The left ventricle has normal function. The left ventricle has no regional wall motion abnormalities. The left ventricular internal cavity size was small. There is moderate  left ventricular hypertrophy. Left ventricular diastolic parameters are indeterminate. Right Ventricle: The right ventricular size is normal. Right vetricular wall thickness was not assessed. Right ventricular systolic function is normal. There is normal pulmonary artery systolic pressure. The tricuspid regurgitant velocity is 1.57 m/s, and with an assumed right atrial pressure of 10 mmHg, the estimated right ventricular systolic pressure is 80.9 mmHg. Left Atrium: Left atrial size was normal in size. Right Atrium: Right atrial size was normal in size.  Pericardium: There is no evidence of pericardial effusion. Mitral Valve: The mitral valve is normal in structure. No evidence of mitral valve regurgitation. Tricuspid Valve: The tricuspid valve is normal in structure. Tricuspid valve regurgitation is trivial. Aortic Valve: The aortic valve is tricuspid. Aortic valve regurgitation is not visualized. Mild aortic valve sclerosis is present, with no evidence of aortic valve stenosis. Pulmonic Valve: The pulmonic valve was not well visualized. Pulmonic valve regurgitation is not visualized. Aorta: The aortic root is normal in size and structure. Venous: The inferior vena cava is normal in size with greater than 50% respiratory variability, suggesting right atrial pressure of 3 mmHg. IAS/Shunts: The interatrial septum was not well visualized.  LEFT VENTRICLE PLAX 2D LVIDd:  2.94 cm  Diastology LVIDs:         2.08 cm  LV e' lateral:   6.09 cm/s LV PW:         1.50 cm  LV E/e' lateral: 11.4 LV IVS:        1.66 cm  LV e' medial:    6.09 cm/s LVOT diam:     2.10 cm  LV E/e' medial:  11.4 LVOT Area:     3.46 cm  LEFT ATRIUM             Index       RIGHT ATRIUM          Index LA diam:        4.00 cm 1.88 cm/m  RA Area:     9.76 cm LA Vol (A2C):   59.1 ml 27.80 ml/m RA Volume:   18.60 ml 8.75 ml/m LA Vol (A4C):   33.7 ml 15.85 ml/m LA Biplane Vol: 48.1 ml 22.62 ml/m   AORTA Ao Root diam: 3.60 cm MITRAL VALVE               TRICUSPID VALVE MV Area (PHT): 3.54 cm    TR Peak grad:   9.9 mmHg MV Decel Time: 214 msec    TR Vmax:        157.00 cm/s MV E velocity: 69.40 cm/s MV A velocity: 67.70 cm/s  SHUNTS MV E/A ratio:  1.03        Systemic Diam: 2.10 cm Oswaldo Milian MD Electronically signed by Oswaldo Milian MD Signature Date/Time: 01/14/2020/3:03:03 PM    Final    ECHO TEE  Result Date: 01/17/2020    TRANSESOPHOGEAL ECHO REPORT   Patient Name:   WINFIELD CABA Date of Exam: 01/17/2020 Medical Rec #:  [id number]       Height:       69.0 in Accession #:     [id number]      Weight:       214.0 lb Date of Birth:  [birth year]       BSA:          2.126 m Patient Age:    55 years        BP:           115/67 mmHg Patient Gender: M               HR:           55 bpm. Exam Location:  [facility] Procedure: Transesophageal Echo Indications:    Bacteremia 790.7 / R78.81  History:        Patient has prior history of Echocardiogram examinations, most                 recent 01/14/2020. Risk Factors:Diabetes, Hypertension,                 Dyslipidemia and Non-Smoker. Immunosuppression , GERD.  Sonographer:    Leavy Cella RDCS (AE) Referring Phys: [id number] Alphonse Guild BRANCH PROCEDURE: The transesophogeal probe was passed without difficulty through the esophogus of the patient. Sedation performed by performing physician. The patient was monitored while under deep sedation. Anesthestetic sedation was provided intravenously by  Anesthesiology: 270m of Propofol. The patient developed no complications during the procedure. IMPRESSIONS  1. Left ventricular ejection fraction, by estimation, is 55 to 60%. The left ventricle has normal function. The left ventricle has no regional wall motion abnormalities.  2. Right ventricular systolic function is normal. The right ventricular  size is normal.  3. Left atrial size was mildly dilated. No left atrial/left atrial appendage thrombus was detected. The LAA emptying velocity was 90 cm/s.  4. Right atrial size was mildly dilated.  5. The mitral valve is normal in structure. Trivial mitral valve regurgitation. No evidence of mitral stenosis.  6. The aortic valve is tricuspid. Aortic valve regurgitation is not visualized. No aortic stenosis is present. Conclusion(s)/Recommendation(s): No evidence of vegetation/infective endocarditis on this transesophageal echocardiogram. FINDINGS  Left Ventricle: Left ventricular ejection fraction, by estimation, is 55 to 60%. The left ventricle has normal function. The left ventricle has no regional wall motion  abnormalities. The left ventricular internal cavity size was normal in size. There is  no left ventricular hypertrophy. Right Ventricle: The right ventricular size is normal. No increase in right ventricular wall thickness. Right ventricular systolic function is normal. Left Atrium: Left atrial size was mildly dilated. No left atrial/left atrial appendage thrombus was detected. The LAA emptying velocity was 90 cm/s. Right Atrium: Right atrial size was mildly dilated. Pericardium: There is no evidence of pericardial effusion. Mitral Valve: The mitral valve is normal in structure. Trivial mitral valve regurgitation. No evidence of mitral valve stenosis. Tricuspid Valve: The tricuspid valve is normal in structure. Tricuspid valve regurgitation is trivial. No evidence of tricuspid stenosis. Aortic Valve: The aortic valve is tricuspid. Aortic valve regurgitation is not visualized. No aortic stenosis is present. Pulmonic Valve: The pulmonic valve was not well visualized. Pulmonic valve regurgitation is not visualized. No evidence of pulmonic stenosis. Aorta: The aortic root is normal in size and structure. IAS/Shunts: No atrial level shunt detected by color flow Doppler. [name] MD Electronically signed by [name] MD Signature Date/Time: 01/17/2020/12:21:15 PM    Final    [place] EKG SITE RITE  Result Date: 01/18/2020 If Site Rite image not attached, placement could not be confirmed due to current cardiac rhythm.       Discharge Exam: Vitals:   01/20/20 0604 01/20/20 1329  BP: (!) 158/85 (!) 185/83  Pulse: 68 66  Resp: 16 16  Temp: 98.8 F (37.1 C) 97.8 F (36.6 C)  SpO2: 100% 96%   Vitals:   01/19/20 0916 01/19/20 2125 01/20/20 0604 01/20/20 1329  BP: (!) 159/79 (!) 158/90 (!) 158/85 (!) 185/83  Pulse: 65 69 68 66  Resp: '18 16 16 16  ' Temp: 98 F (36.7 C) 98.3 F (36.8 C) 98.8 F (37.1 C) 97.8 F (36.6 C)  TempSrc: Oral Oral Oral Oral  SpO2: 100% 100% 100% 96%  Weight:    97.1  kg  Height:    '5\' 9"'  (1.753 m)    General: Pt is alert, awake, not in acute distress Cardiovascular: RRR, S1/S2 +, no rubs, no gallops Respiratory: CTA bilaterally, no wheezing, no rhonchi Abdominal: Soft, NT, ND, bowel sounds + Extremities: no edema, no cyanosis   The results of significant diagnostics from this hospitalization (including imaging, microbiology, ancillary and laboratory) are listed below for reference.    Significant Diagnostic Studies: MR CERVICAL SPINE WO CONTRAST  Result Date: 01/19/2020 CLINICAL DATA:  Severe pain, bacteremia EXAM: MRI CERVICAL SPINE WITHOUT CONTRAST TECHNIQUE: Multiplanar, multisequence MR imaging of the cervical spine was performed. No intravenous contrast was administered. COMPARISON:  None. FINDINGS: Motion artifact is present. Alignment: Anteroposterior alignment is maintained. Vertebrae: Vertebral body heights are preserved. There is no marrow edema. No suspicious osseous lesion. Cord: No abnormal signal within the above limitation. Posterior Fossa, vertebral arteries, paraspinal tissues: Unremarkable. Disc levels:  No disc edema. C2-C3:  Minimal disc bulge.  No canal or foraminal stenosis. C3-C4: Minimal disc bulge small endplate osteophytes. No canal or foraminal stenosis. C4-C5: Minimal disc bulge with small endplate osteophytes. No canal or foraminal stenosis. C5-C6: Minimal disc bulge with small endplate osteophytes. No canal or foraminal stenosis. C6-C7: Minimal disc bulge with small endplate osteophytes. No canal or foraminal stenosis. C7-T1:  No canal or foraminal stenosis. IMPRESSION: No evidence of osteomyelitis/discitis.  No epidural collection. Minor degenerative changes without high-grade stenosis. Electronically Signed   By: Macy Mis M.D.   On: 01/19/2020 17:01   NM Pulmonary Perfusion  Result Date: 01/13/2020 CLINICAL DATA:  Chest pain and shortness of breath with positive D-dimer EXAM: NUCLEAR MEDICINE PERFUSION LUNG SCAN TECHNIQUE:  Perfusion images were obtained in multiple projections after intravenous injection of radiopharmaceutical. Views: Anterior, posterior, left lateral, right lateral, RPO, LPO, RAO, LAO RADIOPHARMACEUTICALS:  3.8 mCi Tc-33mMAA IV COMPARISON:  None. FINDINGS: Radiotracer uptake is homogeneous and symmetric bilaterally. No perfusion defects evident. IMPRESSION: No evident perfusion defects. No findings indicative of pulmonary embolus. Electronically Signed   By: WLowella GripIII M.D.   On: 01/13/2020 08:46   MR FOOT RIGHT WO CONTRAST  Result Date: 01/13/2020 CLINICAL DATA:  Diabetic foot ulcer EXAM: MRI OF THE RIGHT FOREFOOT WITHOUT CONTRAST TECHNIQUE: Multiplanar, multisequence MR imaging of the right foot was performed. No intravenous contrast was administered. COMPARISON:  X-ray 01/12/2020, 03/01/2018 FINDINGS: Bones/Joint/Cartilage Chronic changes to the first metatarsal head with subchondral collapse and subchondral cystic change. There is reactive sclerosis and subchondral cystic change of the opposing great toe proximal phalanx base, more pronounced along the medial aspect. Small first MTP joint effusion. The medial hallux sesamoid also appears sclerotic with heterogeneous T2 hyperintense appearance which may reflect sequela of chronic sesamoiditis. Mild degenerative changes within the midfoot including at the second metatarsal base and within the lateral cuneiform. No acute fracture. No dislocation. No bony erosion or cortical destruction. Ligaments Intact Lisfranc ligament. Collateral ligaments of the forefoot appear intact. Muscles and Tendons Fatty atrophy of the intrinsic foot musculature suggesting chronic denervation changes. Intact flexor and extensor tendons without tenosynovitis. Soft tissues Plantar soft tissue ulceration underlies the medial forefoot at the level of the first MTP joint. Ulcer base is contiguous with a fluid and air containing collection measuring approximately 2.1 x 0.8 x 1.8  cm (series 9, image 20; series 5, image 16) suggesting a developing abscess. IMPRESSION: 1. Plantar soft tissue ulceration underlies the medial forefoot at the level of the first MTP joint. Ulcer base is contiguous with a fluid and air containing collection measuring 2.1 x 0.8 x 1.8 cm suggesting a developing abscess. 2. Chronic bony changes to the first metatarsal head and great toe proximal phalanx with subchondral collapse and subchondral cystic change suggesting osteonecrosis. Findings do not appear appreciably changed compared to multiple prior radiographs. Although felt to be less likely, signal changes related to osteomyelitis would be difficult to entirely exclude. 3. Small first MTP joint effusion is likely reactive. Septic arthritis not excluded. 4. Additional findings suggestive of chronic sesamoiditis. Electronically Signed   By: NDavina PokeD.O.   On: 01/13/2020 10:34   [place] Img Lower Unilateral Right (DVT)  Result Date: 01/12/2020 CLINICAL DATA:  Calf and popliteal fossa pain x1 week, edema, redness EXAM: RIGHT LOWER EXTREMITY VENOUS DOPPLER ULTRASOUND TECHNIQUE: Gray-scale sonography with compression, as well as color and duplex ultrasound, were performed to evaluate the deep venous system(s) from the level of  the common femoral vein through the popliteal and proximal calf veins. COMPARISON:  None. FINDINGS: VENOUS Normal compressibility of the common femoral, superficial femoral, and popliteal veins, as well as the visualized calf veins. Visualized portions of profunda femoral vein and great saphenous vein unremarkable. No filling defects to suggest DVT on grayscale or color Doppler imaging. Doppler waveforms show normal direction of venous flow, normal respiratory phasicity and response to augmentation. Limited views of the contralateral common femoral vein are unremarkable. OTHER Hypoechoic thrombus in a noncompressible right short saphenous vein, and in the great saphenous vein below  the knee. Hypoechoic right inguinal lymph node 1.1 cm short axis diameter. Limitations: none IMPRESSION: 1. Negative for DVT. 2. Superficial thrombophlebitis involving right short saphenous vein, and right GSV below the knee. Electronically Signed   By: Lucrezia Europe M.D.   On: 01/12/2020 16:11   US ARTERIAL ABI (SCREENING LOWER EXTREMITY)  Result Date: 01/13/2020 CLINICAL DATA:  59 year old male with a history of foot wound on right EXAM: NONINVASIVE PHYSIOLOGIC VASCULAR STUDY OF BILATERAL LOWER EXTREMITIES TECHNIQUE: Evaluation of both lower extremities was performed at rest, including calculation of ankle-brachial indices, multiple segmental pressure evaluation, segmental Doppler and segmental pulse volume recording. COMPARISON:  None. FINDINGS: Right ABI:  1.04 Left ABI:  1.09 Right Lower Extremity: Segmental Doppler at the right ankle demonstrates monophasic dorsalis pedis and posterior tibial artery Left Lower Extremity: Segmental Doppler on the left demonstrates triphasic posterior tibial artery and monophasic dorsalis pedis IMPRESSION: Right: Resting ABI within normal limits, though potentially falsely elevated. Segmental Doppler at the ankle demonstrates monophasic waveforms, indicating more proximal occlusive disease. Left: Resting ABI within normal limits, though potentially falsely elevated. Segmental exam demonstrates waveform maintained in the posterior tibial artery and monophasic in the dorsalis pedis. Signed, Dulcy Fanny. Dellia Nims, RPVI [facility] Electronically Signed   By: Corrie Mckusick D.O.   On: 01/13/2020 13:49   DG Foot Complete Right  Result Date: 01/12/2020 CLINICAL DATA:  Diabetic foot infection. EXAM: RIGHT FOOT COMPLETE - 3+ VIEW COMPARISON:  April 05, 2019 FINDINGS: There is no evidence of an acute fracture or dislocation. A chronic fracture of the proximal phalanx of the fifth right toe is noted. There is a large plantar  calcaneal spur. Moderate severity degenerative changes are seen involving the metatarsophalangeal joint of the right great toe. Moderate to marked severity vascular calcification is seen. A mild amount of soft tissue air is seen along the plantar aspect of the distal right foot. Mild to moderate severity associated soft tissue swelling is seen. IMPRESSION: 1. Mild amount of soft tissue air along the plantar aspect of the distal right foot. 2. Chronic and degenerative changes, as described above. Electronically Signed   By: Virgina Norfolk M.D.   On: 01/12/2020 16:35   ECHOCARDIOGRAM COMPLETE  Result Date: 01/14/2020    ECHOCARDIOGRAM REPORT   Patient Name:   DOVBER ERNEST Date of Exam: 01/14/2020 Medical Rec #:  [id number]       Height:       69.0 in Accession #:    [id number]      Weight:       214.0 lb Date of Birth:  [birth year]       BSA:          2.126 m Patient Age:    18 years        BP:           131/86 mmHg Patient Gender: M  HR:           76 bpm. Exam Location:  [facility] Procedure: 2D Echo Indications:    Bacteremia 790.7 / R78.81  History:        Patient has no prior history of Echocardiogram examinations.                 Risk Factors:Dyslipidemia, Non-Smoker, Diabetes and                 Hypertension. Acute Kidney Injury, GERD.  Sonographer:    Leavy Cella RDCS (AE) Referring Phys: [place]  1. Left ventricular ejection fraction, by estimation, is 55 to 60%. The left ventricle has normal function. The left ventricle has no regional wall motion abnormalities. There is moderate left ventricular hypertrophy. Left ventricular diastolic parameters are indeterminate.  2. Right ventricular systolic function is normal. The right ventricular size is normal. There is normal pulmonary artery systolic pressure.  3. The mitral valve is normal in structure. No evidence of mitral valve regurgitation.  4. The aortic valve is tricuspid. Aortic valve regurgitation is not  visualized. Mild aortic valve sclerosis is present, with no evidence of aortic valve stenosis.  5. The inferior vena cava is normal in size with greater than 50% respiratory variability, suggesting right atrial pressure of 3 mmHg. Conclusion(s)/Recommendation(s): No vegetation seen. If high clinical suspicion for endocarditis, consider TEE. FINDINGS  Left Ventricle: Left ventricular ejection fraction, by estimation, is 55 to 60%. The left ventricle has normal function. The left ventricle has no regional wall motion abnormalities. The left ventricular internal cavity size was small. There is moderate  left ventricular hypertrophy. Left ventricular diastolic parameters are indeterminate. Right Ventricle: The right ventricular size is normal. Right vetricular wall thickness was not assessed. Right ventricular systolic function is normal. There is normal pulmonary artery systolic pressure. The tricuspid regurgitant velocity is 1.57 m/s, and with an assumed right atrial pressure of 10 mmHg, the estimated right ventricular systolic pressure is 48.2 mmHg. Left Atrium: Left atrial size was normal in size. Right Atrium: Right atrial size was normal in size. Pericardium: There is no evidence of pericardial effusion. Mitral Valve: The mitral valve is normal in structure. No evidence of mitral valve regurgitation. Tricuspid Valve: The tricuspid valve is normal in structure. Tricuspid valve regurgitation is trivial. Aortic Valve: The aortic valve is tricuspid. Aortic valve regurgitation is not visualized. Mild aortic valve sclerosis is present, with no evidence of aortic valve stenosis. Pulmonic Valve: The pulmonic valve was not well visualized. Pulmonic valve regurgitation is not visualized. Aorta: The aortic root is normal in size and structure. Venous: The inferior vena cava is normal in size with greater than 50% respiratory variability, suggesting right atrial pressure of 3 mmHg. IAS/Shunts: The interatrial septum was not  well visualized.  LEFT VENTRICLE PLAX 2D LVIDd:         2.94 cm  Diastology LVIDs:         2.08 cm  LV e' lateral:   6.09 cm/s LV PW:         1.50 cm  LV E/e' lateral: 11.4 LV IVS:        1.66 cm  LV e' medial:    6.09 cm/s LVOT diam:     2.10 cm  LV E/e' medial:  11.4 LVOT Area:     3.46 cm  LEFT ATRIUM             Index  RIGHT ATRIUM          Index LA diam:        4.00 cm 1.88 cm/m  RA Area:     9.76 cm LA Vol (A2C):   59.1 ml 27.80 ml/m RA Volume:   18.60 ml 8.75 ml/m LA Vol (A4C):   33.7 ml 15.85 ml/m LA Biplane Vol: 48.1 ml 22.62 ml/m   AORTA Ao Root diam: 3.60 cm MITRAL VALVE               TRICUSPID VALVE MV Area (PHT): 3.54 cm    TR Peak grad:   9.9 mmHg MV Decel Time: 214 msec    TR Vmax:        157.00 cm/s MV E velocity: 69.40 cm/s MV A velocity: 67.70 cm/s  SHUNTS MV E/A ratio:  1.03        Systemic Diam: 2.10 cm Oswaldo Milian MD Electronically signed by Oswaldo Milian MD Signature Date/Time: 01/14/2020/3:03:03 PM    Final    ECHO TEE  Result Date: 01/17/2020    TRANSESOPHOGEAL ECHO REPORT   Patient Name:   HUSSEIN MACDOUGAL Date of Exam: 01/17/2020 Medical Rec #:  [id number]       Height:       69.0 in Accession #:    [id number]      Weight:       214.0 lb Date of Birth:  08/24/1960       BSA:          2.126 m Patient Age:    8 years        BP:           115/67 mmHg Patient Gender: M               HR:           55 bpm. Exam Location:  [facility] Procedure: Transesophageal Echo Indications:    Bacteremia 790.7 / R78.81  History:        Patient has prior history of Echocardiogram examinations, most                 recent 01/14/2020. Risk Factors:Diabetes, Hypertension,                 Dyslipidemia and Non-Smoker. Immunosuppression , GERD.  Sonographer:    Leavy Cella RDCS (AE) Referring Phys: [id number] Alphonse Guild BRANCH PROCEDURE: The transesophogeal probe was passed without difficulty through the esophogus of the patient. Sedation performed by performing physician. The patient  was monitored while under deep sedation. Anesthestetic sedation was provided intravenously by  Anesthesiology: 223m of Propofol. The patient developed no complications during the procedure. IMPRESSIONS  1. Left ventricular ejection fraction, by estimation, is 55 to 60%. The left ventricle has normal function. The left ventricle has no regional wall motion abnormalities.  2. Right ventricular systolic function is normal. The right ventricular size is normal.  3. Left atrial size was mildly dilated. No left atrial/left atrial appendage thrombus was detected. The LAA emptying velocity was 90 cm/s.  4. Right atrial size was mildly dilated.  5. The mitral valve is normal in structure. Trivial mitral valve regurgitation. No evidence of mitral stenosis.  6. The aortic valve is tricuspid. Aortic valve regurgitation is not visualized. No aortic stenosis is present. Conclusion(s)/Recommendation(s): No evidence of vegetation/infective endocarditis on this transesophageal echocardiogram. FINDINGS  Left Ventricle: Left ventricular ejection fraction, by estimation, is 55 to 60%. The left ventricle has normal function. The left ventricle has  no regional wall motion abnormalities. The left ventricular internal cavity size was normal in size. There is  no left ventricular hypertrophy. Right Ventricle: The right ventricular size is normal. No increase in right ventricular wall thickness. Right ventricular systolic function is normal. Left Atrium: Left atrial size was mildly dilated. No left atrial/left atrial appendage thrombus was detected. The LAA emptying velocity was 90 cm/s. Right Atrium: Right atrial size was mildly dilated. Pericardium: There is no evidence of pericardial effusion. Mitral Valve: The mitral valve is normal in structure. Trivial mitral valve regurgitation. No evidence of mitral valve stenosis. Tricuspid Valve: The tricuspid valve is normal in structure. Tricuspid valve regurgitation is trivial. No evidence of  tricuspid stenosis. Aortic Valve: The aortic valve is tricuspid. Aortic valve regurgitation is not visualized. No aortic stenosis is present. Pulmonic Valve: The pulmonic valve was not well visualized. Pulmonic valve regurgitation is not visualized. No evidence of pulmonic stenosis. Aorta: The aortic root is normal in size and structure. IAS/Shunts: No atrial level shunt detected by color flow Doppler. [name] MD Electronically signed by [name] MD Signature Date/Time: 01/17/2020/12:21:15 PM    Final    [place] EKG SITE RITE  Result Date: 01/18/2020 If Site Rite image not attached, placement could not be confirmed due to current cardiac rhythm.    Microbiology: Recent Results (from the past 240 hour(s))  WOUND CULTURE     Status: Abnormal   Collection Time: 01/12/20  2:12 PM  Result Value Ref Range Status   MICRO NUMBER: [id number]  Final   SPECIMEN QUALITY: Adequate  Final   SOURCE: NOT GIVEN  Final   STATUS: FINAL  Final   GRAM STAIN:   Final    Rare White blood cells seen No epithelial cells seen Many Gram positive cocci in pairs Few Gram positive bacilli   ISOLATE 1: methicillin resistant Staphylococcus aureus (A)  Final    Comment: Heavy growth of Methicillin resistant Staphylococcus aureus (MRSA) Negative for inducible clindamycin resistance.      Susceptibility   Methicillin resistant staphylococcus aureus - AEROBIC CULT, GRAM STAIN POSITIVE 1    VANCOMYCIN 1 Sensitive     CIPROFLOXACIN >=8 Resistant     CLINDAMYCIN <=0.25 Sensitive     LEVOFLOXACIN 4 Resistant     ERYTHROMYCIN >=8 Resistant     GENTAMICIN <=0.5 Sensitive     OXACILLIN >=4 Resistant     TETRACYCLINE >=16 Resistant     TRIMETH/SULFA* <=10 Sensitive      * Legend:S = Susceptible  I = IntermediateR = Resistant  NS = Not susceptible* = Not tested  NR = Not reported**NN = See antimicrobic comments  SARS Coronavirus 2 by RT PCR (hospital order, performed in [place] hospital lab) Nasopharyngeal  Nasopharyngeal Swab     Status: None   Collection Time: 01/12/20  4:06 PM   Specimen: Nasopharyngeal Swab  Result Value Ref Range Status   SARS Coronavirus 2 NEGATIVE NEGATIVE Final    Comment: (NOTE) SARS-CoV-2 target nucleic acids are NOT DETECTED.  The SARS-CoV-2 RNA is generally detectable in upper and lower respiratory specimens during the acute phase of infection. The lowest concentration of SARS-CoV-2 viral copies this assay can detect is 250 copies / mL. A negative result does not preclude SARS-CoV-2 infection and should not be used as the sole basis for treatment or other patient management decisions.  A negative result may occur with improper specimen collection / handling, submission of specimen other than nasopharyngeal swab, presence of viral mutation(s)  within the areas targeted by this assay, and inadequate number of viral copies (<250 copies / mL). A negative result must be combined with clinical observations, patient history, and epidemiological information.  Fact Sheet for Patients:   StrictlyIdeas.no  Fact Sheet for Healthcare Providers: BankingDealers.co.za  This test is not yet approved or  cleared by the [place] FDA and has been authorized for detection and/or diagnosis of SARS-CoV-2 by FDA under an Emergency Use Authorization (EUA).  This EUA will remain in effect (meaning this test can be used) for the duration of the COVID-19 declaration under Section 564(b)(1) of the Act, 21 U.S.C. section 360bbb-3(b)(1), unless the authorization is terminated or revoked sooner.  Performed at [facility], [address] [place], [place] [address]   Culture, blood (routine x 2)     Status: Abnormal (Preliminary result)   Collection Time: 01/12/20  6:16 PM   Specimen: BLOOD RIGHT HAND  Result Value Ref Range Status   Specimen Description   Final    BLOOD RIGHT HAND Performed at [facility], [address]  [place], [place] [address]    Special Requests   Final    BOTTLES DRAWN AEROBIC AND ANAEROBIC Blood Culture adequate volume Performed at [address] [place], [place] [address]    Culture  Setup Time   Final    ANAEROBIC and AEROBIC BOTTLE GRAM POSITIVE COCCI Gram Stain Report Called to,Read Back By and Verified With: DEAN,T'@1103'  BY MATTHEWS, B 8.20.2021 [facility] Organism ID to follow CRITICAL RESULT CALLED TO, READ BACK BY AND VERIFIED WITH: A,MUHAMMAD RN '@[id number]'  01/13/20 EB    Culture (A)  Final    METHICILLIN RESISTANT STAPHYLOCOCCUS AUREUS ENTEROCOCCUS FAECALIS CRITICAL RESULT CALLED TO, READ BACK BY AND VERIFIED WITH: Tally Due [id number] FCP Performed at [facility], [place] [address], [place], [place] [address]    Report Status PENDING  Incomplete   Organism ID, Bacteria METHICILLIN RESISTANT STAPHYLOCOCCUS AUREUS  Final   Organism ID, Bacteria ENTEROCOCCUS FAECALIS  Final      Susceptibility   Enterococcus faecalis - MIC*    AMPICILLIN <=2 SENSITIVE Sensitive     VANCOMYCIN 1 SENSITIVE Sensitive     GENTAMICIN SYNERGY SENSITIVE Sensitive     * ENTEROCOCCUS FAECALIS   Methicillin resistant staphylococcus aureus - MIC*    CIPROFLOXACIN >=8 RESISTANT Resistant     ERYTHROMYCIN >=8 RESISTANT Resistant     GENTAMICIN <=0.5 SENSITIVE Sensitive     OXACILLIN >=4 RESISTANT Resistant     TETRACYCLINE >=16 RESISTANT Resistant     VANCOMYCIN <=0.5 SENSITIVE Sensitive     TRIMETH/SULFA <=10 SENSITIVE Sensitive     CLINDAMYCIN <=0.25 SENSITIVE Sensitive     RIFAMPIN <=0.5 SENSITIVE Sensitive     Inducible Clindamycin NEGATIVE Sensitive     * METHICILLIN RESISTANT STAPHYLOCOCCUS AUREUS  Culture, blood (routine x 2)     Status: Abnormal   Collection Time: 01/12/20  6:16 PM   Specimen: BLOOD RIGHT HAND  Result Value Ref Range Status   Specimen Description   Final    BLOOD RIGHT HAND Performed at [facility], [address], [place],  [address]     Special Requests   Final    BOTTLES DRAWN AEROBIC AND ANAEROBIC Blood Culture adequate volume Performed at [facility], [address] [place],  [address]    Culture  Setup Time   Final    ANAEROBIC AND AEROBIC BOTTLES GRAM POSITIVE COCCI Gram Stain Report Called  to,Read Back By and Verified With: DEAN,T'@1103'  BY MATTHEWS, B 8.20.21 [place] CRITICAL VALUE NOTED.  VALUE IS CONSISTENT WITH PREVIOUSLY REPORTED AND CALLED VALUE.    Culture (A)  Final    STAPHYLOCOCCUS AUREUS ENTEROCOCCUS FAECALIS SUSCEPTIBILITIES PERFORMED ON PREVIOUS CULTURE WITHIN THE LAST 5 DAYS. Performed at [facility], [place] [address], [place], [place] [address]    Report Status 01/15/2020 FINAL  Final  Blood Culture ID Panel (Reflexed)     Status: Abnormal   Collection Time: 01/12/20  6:16 PM  Result Value Ref Range Status   Enterococcus faecalis NOT DETECTED NOT DETECTED Final   Enterococcus Faecium NOT DETECTED NOT DETECTED Final   Listeria monocytogenes NOT DETECTED NOT DETECTED Final   Staphylococcus species DETECTED (A) NOT DETECTED Final    Comment: RESULT CALLED TO, READ BACK BY AND VERIFIED WITH: A,MUHAMMAD RN '@1947'  01/13/20 EB    Staphylococcus aureus (BCID) DETECTED (A) NOT DETECTED Final    Comment: Methicillin (oxacillin)-resistant Staphylococcus aureus (MRSA). MRSA is predictably resistant to beta-lactam antibiotics (except ceftaroline). Preferred therapy is vancomycin unless clinically contraindicated. Patient requires contact precautions if  hospitalized. RESULT CALLED TO, READ BACK BY AND VERIFIED WITH: A,MUHAMMAD RN '@1947'  01/13/20 EB    Staphylococcus epidermidis NOT DETECTED NOT DETECTED Final   Staphylococcus lugdunensis NOT DETECTED NOT DETECTED Final   Streptococcus species NOT DETECTED NOT DETECTED Final   Streptococcus agalactiae NOT DETECTED NOT DETECTED Final   Streptococcus pneumoniae NOT DETECTED NOT DETECTED Final   Streptococcus pyogenes NOT DETECTED  NOT DETECTED Final   A.calcoaceticus-baumannii NOT DETECTED NOT DETECTED Final   Bacteroides fragilis NOT DETECTED NOT DETECTED Final   Enterobacterales NOT DETECTED NOT DETECTED Final   Enterobacter cloacae complex NOT DETECTED NOT DETECTED Final   Escherichia coli NOT DETECTED NOT DETECTED Final   Klebsiella aerogenes NOT DETECTED NOT DETECTED Final   Klebsiella oxytoca NOT DETECTED NOT DETECTED Final   Klebsiella pneumoniae NOT DETECTED NOT DETECTED Final   Proteus species NOT DETECTED NOT DETECTED Final   Salmonella species NOT DETECTED NOT DETECTED Final   Serratia marcescens NOT DETECTED NOT DETECTED Final   Haemophilus influenzae NOT DETECTED NOT DETECTED Final   Neisseria meningitidis NOT DETECTED NOT DETECTED Final   Pseudomonas aeruginosa NOT DETECTED NOT DETECTED Final   Stenotrophomonas maltophilia NOT DETECTED NOT DETECTED Final   Candida albicans NOT DETECTED NOT DETECTED Final   Candida auris NOT DETECTED NOT DETECTED Final   Candida glabrata NOT DETECTED NOT DETECTED Final   Candida krusei NOT DETECTED NOT DETECTED Final   Candida parapsilosis NOT DETECTED NOT DETECTED Final   Candida tropicalis NOT DETECTED NOT DETECTED Final   Cryptococcus neoformans/gattii NOT DETECTED NOT DETECTED Final   Meth resistant mecA/C and MREJ DETECTED (A) NOT DETECTED Final    Comment: RESULT CALLED TO, READ BACK BY AND VERIFIED WITH: A,MUHAMMAD RN '@1947'  01/13/20 EB Performed at [facility], [address] [place], [place] [address]   Culture, blood (Routine X 2) w Reflex to ID Panel     Status: None   Collection Time: 01/13/20  9:04 PM   Specimen: BLOOD RIGHT HAND  Result Value Ref Range Status   Specimen Description BLOOD RIGHT HAND  Final   Special Requests   Final    BOTTLES DRAWN AEROBIC AND ANAEROBIC Blood Culture adequate volume   Culture   Final    NO GROWTH 5 DAYS Performed at [facility], [address], [place],  [address]    Report  Status 01/18/2020 FINAL   Final  Culture, blood (Routine X 2) w Reflex to ID Panel     Status: None   Collection Time: 01/13/20  9:04 PM   Specimen: BLOOD RIGHT HAND  Result Value Ref Range Status   Specimen Description BLOOD RIGHT HAND  Final   Special Requests   Final    BOTTLES DRAWN AEROBIC AND ANAEROBIC Blood Culture adequate volume   Culture   Final    NO GROWTH 5 DAYS Performed at [facility], [address] [place], [place] [address]    Report Status 01/18/2020 FINAL  Final  Culture, blood (Routine X 2) w Reflex to ID Panel     Status: None   Collection Time: 01/15/20  1:46 PM   Specimen: BLOOD RIGHT HAND  Result Value Ref Range Status   Specimen Description   Final    BLOOD RIGHT HAND BOTTLES DRAWN AEROBIC AND ANAEROBIC   Special Requests Blood Culture adequate volume  Final   Culture   Final    NO GROWTH 5 DAYS Performed at [facility], [address] [place], [place] [address]    Report Status 01/20/2020 FINAL  Final  Culture, blood (Routine X 2) w Reflex to ID Panel     Status: None   Collection Time: 01/15/20  1:46 PM   Specimen: Right Antecubital; Blood  Result Value Ref Range Status   Specimen Description   Final    RIGHT ANTECUBITAL BOTTLES DRAWN AEROBIC AND ANAEROBIC   Special Requests Blood Culture adequate volume  Final   Culture   Final    NO GROWTH 5 DAYS Performed at [facility], [address] [place], [place] [address]    Report Status 01/20/2020 FINAL  Final     Labs: Basic Metabolic Panel: Recent Labs  Lab 01/17/20 0545 01/17/20 0545 01/19/20 0801 01/20/20 0510  NA 138  --  136 137  K 4.1   < > 4.6 5.1  CL 111  --  108 110  CO2 19*  --  22 20*  GLUCOSE 240*  --  140* 163*  BUN 23*  --  22* 23*  CREATININE 1.05  --  1.09 1.14  CALCIUM 8.9  --  9.1 9.2   < > = values in this interval not displayed.   Liver Function Tests: No results for input(s): AST, ALT, ALKPHOS, BILITOT, PROT, ALBUMIN in the last 168 hours. No results for input(s): LIPASE, AMYLASE in the  last 168 hours. No results for input(s): AMMONIA in the last 168 hours. CBC: Recent Labs  Lab 01/17/20 0545 01/19/20 0801 01/20/20 0510  WBC 6.1 5.8 7.7  HGB 12.6* 12.1* 12.5*  HCT 42.3 41.0 41.7  MCV 84.4 86.0 84.6  PLT 144* 176 195   Cardiac Enzymes: Recent Labs  Lab 01/20/20 0510  CKTOTAL 61   BNP: Invalid input(s): POCBNP CBG: Recent Labs  Lab 01/19/20 1721 01/19/20 2138 01/20/20 0740 01/20/20 1147 01/20/20 1325  GLUCAP 132* 97 144* 71 122*    Time coordinating discharge:  36 minutes  Signed:  Orson Eva, DO [facility] Pager: [phone] 01/20/2020, 2:22 PM

## 2020-01-20 NOTE — Anesthesia Postprocedure Evaluation (Signed)
Anesthesia Post Note  Patient: Raymond [name]  Procedure(s) Performed: INSERTION CENTRAL LINE ADULT; Tunneled central line (Right Chest)  Patient location during evaluation: PACU Anesthesia Type: General Level of consciousness: awake and alert and oriented Pain management: pain level controlled Vital Signs Assessment: post-procedure vital signs reviewed and stable Respiratory status: spontaneous breathing Cardiovascular status: blood pressure returned to baseline and stable Postop Assessment: no apparent nausea or vomiting Anesthetic complications: no   No complications documented.   Last Vitals:  Vitals:   01/20/20 0604 01/20/20 1329  BP: (!) 158/85 (!) 185/83  Pulse: 68 66  Resp: 16 16  Temp: 37.1 C 36.6 C  SpO2: 100% 96%    Last Pain:  Vitals:   01/20/20 1329  TempSrc: Oral  PainSc: 5                  Aanshi Batchelder

## 2020-01-20 NOTE — Transfer of Care (Signed)
Immediate Anesthesia Transfer of Care Note  Patient: Raymond [name]  Procedure(s) Performed: INSERTION CENTRAL LINE ADULT; Tunneled central line (Right Chest)  Patient Location: PACU  Anesthesia Type:General  Level of Consciousness: awake  Airway & Oxygen Therapy: Patient Spontanous Breathing  Post-op Assessment: Report given to RN  Post vital signs: Reviewed  Last Vitals:  Vitals Value Taken Time  BP 202/80 01/20/20 1550  Temp    Pulse 64 01/20/20 1553  Resp 13 01/20/20 1553  SpO2 94 % 01/20/20 1553  Vitals shown include unvalidated device data.  Last Pain:  Vitals:   01/20/20 1329  TempSrc: Oral  PainSc: 5       Patients Stated Pain Goal: 0 (93/01/23 7990)  Complications: No complications documented.

## 2020-01-20 NOTE — TOC Transition Note (Signed)
Transition of Care [facility]) - CM/SW Discharge Note   Patient Details  Name: Raymond [name] MRN: [id number] Date of Birth: 1960/08/24  [facility] of [facility] CM/SW Contact:  Natasha Bence, LCSW Phone Number: 01/20/2020, 4:43 PM   Clinical Narrative:    CSW confirmed with Pam with [facility] that patient will be receiving IV antibiotics with their company. CSW also confirmed that Pam had received OPAT and identified the tunneled central line as they type of access for the IV antibiotics. Pam reported that she update the patients family that they will be self administering the IV antibiotics on 08/27, but will have a HHRN provide treatment beginning 01/21/2020. TOC signing off.      Barriers to Discharge: Continued Medical Work up   Patient Goals and CMS Choice Patient states their goals for this hospitalization and ongoing recovery are:: return home      Discharge Placement                       Discharge Plan and Services In-house Referral: Clinical Social Work   Post Acute Care Choice: Home Health                    HH Arranged: RN, IV Antibiotics HH Agency: Other - See comment ([facility]) Date [place]: 01/17/20 Time [place]: 6010 Representative spoke with at [place]: [facility]  Social Determinants of Health (SDOH) Interventions     Readmission Risk Interventions No flowsheet data found.

## 2020-01-20 NOTE — Progress Notes (Signed)
Previous appt made for pt to come via [facility] to [facility] for procedure-arrive at 1000 for 1100 appt.  Called to [facility] to see if pt has left with [facility] to come to [facility]. PT RN states that pt will have this procedure done at [facility] and there is no need for services at [facility] at this time.

## 2020-01-23 ENCOUNTER — Telehealth: Payer: Self-pay

## 2020-01-23 ENCOUNTER — Ambulatory Visit: Payer: BC Managed Care – PPO | Admitting: Nurse Practitioner

## 2020-01-23 NOTE — Telephone Encounter (Signed)
Did not do TCM pt was at a doctors appt, per pt he will call back to do it

## 2020-01-24 ENCOUNTER — Other Ambulatory Visit: Payer: Self-pay

## 2020-01-24 ENCOUNTER — Ambulatory Visit (INDEPENDENT_AMBULATORY_CARE_PROVIDER_SITE_OTHER): Payer: BC Managed Care – PPO

## 2020-01-24 ENCOUNTER — Ambulatory Visit (INDEPENDENT_AMBULATORY_CARE_PROVIDER_SITE_OTHER): Payer: BC Managed Care – PPO | Admitting: Podiatry

## 2020-01-24 ENCOUNTER — Encounter (HOSPITAL_COMMUNITY): Payer: Self-pay | Admitting: General Surgery

## 2020-01-24 DIAGNOSIS — L97512 Non-pressure chronic ulcer of other part of right foot with fat layer exposed: Secondary | ICD-10-CM | POA: Diagnosis not present

## 2020-01-24 DIAGNOSIS — L02611 Cutaneous abscess of right foot: Secondary | ICD-10-CM | POA: Diagnosis not present

## 2020-01-24 DIAGNOSIS — M86171 Other acute osteomyelitis, right ankle and foot: Secondary | ICD-10-CM

## 2020-01-26 ENCOUNTER — Telehealth: Payer: BC Managed Care – PPO | Admitting: Nurse Practitioner

## 2020-01-27 LAB — MISC LABCORP TEST (SEND OUT)
Labcorp test code: 96388
Labcorp test code: 96388

## 2020-01-29 LAB — MIC RESULT

## 2020-01-29 LAB — MINIMUM INHIBITORY CONC. (1 DRUG)

## 2020-01-31 ENCOUNTER — Other Ambulatory Visit: Payer: Self-pay

## 2020-01-31 ENCOUNTER — Ambulatory Visit (INDEPENDENT_AMBULATORY_CARE_PROVIDER_SITE_OTHER): Payer: BC Managed Care – PPO | Admitting: Podiatry

## 2020-01-31 DIAGNOSIS — I739 Peripheral vascular disease, unspecified: Secondary | ICD-10-CM

## 2020-01-31 DIAGNOSIS — E0843 Diabetes mellitus due to underlying condition with diabetic autonomic (poly)neuropathy: Secondary | ICD-10-CM | POA: Diagnosis not present

## 2020-01-31 DIAGNOSIS — L97512 Non-pressure chronic ulcer of other part of right foot with fat layer exposed: Secondary | ICD-10-CM | POA: Diagnosis not present

## 2020-01-31 DIAGNOSIS — M86171 Other acute osteomyelitis, right ankle and foot: Secondary | ICD-10-CM

## 2020-01-31 NOTE — Progress Notes (Signed)
  Subjective:  Patient ID: Raymond [name], male    DOB: 07-25-1960,  MRN: [id number]  Chief Complaint  Patient presents with  . Diabetic Ulcer    right foot, A1C 13.7    59 y.o. male presents with the above complaint. History confirmed with patient.  At his last visit with Dr. Earleen Newport he was showing signs of systemic illnesses and was sent to the emergency room [facility].  He was admitted with MRSA bacteremia, they had plan to transfer him to [facility] for surgical intervention by Dr. Earleen Newport but the hospital and no bed availability.  He instead received a tunneled central line and has been receiving IV daptomycin.  He will be on this until 02/24/2020 overall he feels well now and feels like his wound is improving.  Objective:  Physical Exam: warm, good capillary refill and normal DP and PT pulses.  Right foot submet 1 ulceration measuring 1.7 x 1.5 x 0.3 cm with granular base, hyperkeratotic wound edges and no signs of acute infection.      Assessment:   1. Osteomyelitis of ankle or foot, right, acute ([place])   2. Foot ulcer, right, with fat layer exposed ([place])   3. Diabetes mellitus due to underlying condition with diabetic autonomic neuropathy, unspecified whether long term insulin use ([place])   4. PAD (peripheral artery disease) ([place])      Plan:  Patient was evaluated and treated and all questions answered.  -Continue IV antibiotics per infectious disease -Discussed with him treatments for osteomyelitis and how the best outcomes will come with accommodation of IV antibiotics and surgical resection.  He will discuss this further with Dr. Earleen Newport as next visit. -Dressing applied consisting of prisma -Offload ulcer with surgical shoe -Wound cleansed and debrided  Procedure: Selective Debridement of Wound Rationale: Removal of devitalized tissue from the wound to promote healing.  Pre-Debridement Wound Measurements: 1.7 cm x 1.5 cm x 0.3 cm  Post-Debridement Wound Measurements: same  as pre-debridement. Type of Debridement: sharp selective Tissue Removed: Devitalized soft-tissue Dressing: Dry, sterile, compression dressing. Disposition: Patient tolerated procedure well.    Return in about 1 week (around 02/07/2020) for with Dr Jacqualyn Posey, wound re-check.

## 2020-02-02 NOTE — Progress Notes (Signed)
Subjective: 59 year old male presents the office today for follow-up of infection, abscess to his right foot.  After last saw him he was admitted to [facility] and he was started on IV antibiotics and general surgery saw the patient however no surgery was performed.  He presents today for follow-up evaluation.  He currently has a PICC line in place and is on daptomycin.  He does not yet have a follow-up with infectious disease.  He states that since discharge from the hospital he is feeling much better.  Currently denies any systemic complaints such as fevers, chills, nausea, vomiting. No acute changes since last appointment, and no other complaints at this time.   Objective: AAO x3, NAD DP/PT pulses palpable bilaterally, CRT less than 3 seconds Sensation decreased with Semmes Weinstein monofilament Thick hyperkeratotic tissue present on the right foot submetatarsal 1.  Upon debridement superficial granular wound was present with macerated tissue.  The wound measures 2.5 x 2 x 0.2 cm there is no probing to bone, undermining, and there is no drainage or pus identified today.  There is no areas of fluctuation or crepitation.  There is no malodor.  Mild swelling to the foot/leg. No pain with calf compression, swelling, warmth, erythema    Assessment:  Plan: -All treatment options discussed with the patient including all alternatives, risks, complications.  -Reviewed the MRI with him which did reveal an ulceration and also possible fluid collection suggesting developing abscess.  Also repeat x-rays today there is lucencies present the first MPJ concerning for osteomyelitis.  He also had arterial studies performed while in the hospital. -Sharply debrided the wound today to remove thick hyperkeratotic tissue to reveal the underlying ulceration cellulitis of the 312 with scalpel to any complications or bleeding.  Prior to debridement the wound only measured 0.5 x 0.5 cm and after debridement was  2.5 x 2 x 0.2 cm.  There is no signs of an abscess today there is no fluctuation.  There is minimal bleeding/purulence today.  I long discussion with him regards her treatment options and I recommended to proceed with surgical invention to resect the bone and possible amputation but he was adamant about not having this performed.  For now continue IV antibiotics and I put her infectious disease consultation.  In the follow-up with Dr. Sherryle Lis next week.  Discussed with him to monitor closely signs or symptoms of worsening infection to call the office or report directly back to the emergency department. -Patient encouraged to call the office with any questions, concerns, change in symptoms.   Trula Slade DPM

## 2020-02-07 ENCOUNTER — Ambulatory Visit (INDEPENDENT_AMBULATORY_CARE_PROVIDER_SITE_OTHER): Payer: BC Managed Care – PPO

## 2020-02-07 ENCOUNTER — Ambulatory Visit (INDEPENDENT_AMBULATORY_CARE_PROVIDER_SITE_OTHER): Payer: BC Managed Care – PPO | Admitting: Podiatry

## 2020-02-07 ENCOUNTER — Other Ambulatory Visit: Payer: Self-pay

## 2020-02-07 DIAGNOSIS — E0843 Diabetes mellitus due to underlying condition with diabetic autonomic (poly)neuropathy: Secondary | ICD-10-CM | POA: Diagnosis not present

## 2020-02-07 DIAGNOSIS — M86171 Other acute osteomyelitis, right ankle and foot: Secondary | ICD-10-CM

## 2020-02-07 DIAGNOSIS — L97512 Non-pressure chronic ulcer of other part of right foot with fat layer exposed: Secondary | ICD-10-CM

## 2020-02-10 ENCOUNTER — Telehealth: Payer: Self-pay

## 2020-02-10 DIAGNOSIS — M79676 Pain in unspecified toe(s): Secondary | ICD-10-CM

## 2020-02-10 NOTE — Telephone Encounter (Signed)
Left pt v/m to call the office so we can schedule him an appointment. YL,RMA

## 2020-02-13 NOTE — Progress Notes (Signed)
Subjective: 59 year old male presents the office today for follow-up of infection, abscess to his right foot. He is still on daptomycin.  He feels that the foot is doing better and he denies any fevers, chills, nausea, vomiting.  No calf pain, chest pain, shortness of breath.  Has no other concerns today.   Objective: AAO x3, NAD DP/PT pulses palpable bilaterally, CRT less than 3 seconds Sensation decreased with Semmes Weinstein monofilament On the right foot submetatarsal 1 ulceration today measuring 1 x 1 x 0.2 cm with a granular wound base measures mild hyperkeratotic periwound.  There is no warmth of the foot there is no significant erythema there is no fluctuation capitation.  No malodor. No pain with calf compression, swelling, warmth, erythema  Assessment: 59 year old male with osteomyelitis, abscess right foot  Plan: -All treatment options discussed with the patient including all alternatives, risks, complications.  -Repeat x-rays obtained and reviewed.  No evidence of acute fracture or stress fracture.  Arthritic changes present the first MPJ.  Radiolucency on the first MPJ which is stable compared to prior x-rays. -Today we had a long discussion in regards to both conservative as well as surgical options.  I previously recommended surgical excision as well as resection of infected bone or bone biopsy however he has been on antibiotics now for several weeks and I don't need a bone biopsy be helpful.  Clinically the foot is doing much better there is no obvious signs of abscess and infection appears to be improved significantly.  Also discussed glucose control. -Sharply debrided the wound today was #312 with scalpel without any complications or bleeding down to healthy, weak and viable tissue.  Continue with daily dressing changes. -Elevation  Trula Slade DPM

## 2020-02-16 ENCOUNTER — Other Ambulatory Visit: Payer: Self-pay

## 2020-02-16 ENCOUNTER — Ambulatory Visit (INDEPENDENT_AMBULATORY_CARE_PROVIDER_SITE_OTHER): Payer: BC Managed Care – PPO | Admitting: Podiatry

## 2020-02-16 DIAGNOSIS — L97512 Non-pressure chronic ulcer of other part of right foot with fat layer exposed: Secondary | ICD-10-CM

## 2020-02-16 DIAGNOSIS — E0843 Diabetes mellitus due to underlying condition with diabetic autonomic (poly)neuropathy: Secondary | ICD-10-CM

## 2020-02-18 LAB — CULTURE, BLOOD (ROUTINE X 2)
Special Requests: ADEQUATE
Special Requests: ADEQUATE

## 2020-02-20 NOTE — Progress Notes (Signed)
Subjective: 59 year old male presents the office today for follow-up of infection, abscess to his right foot. He is still on daptomycin and tolerating well any side effects.  He states the foot is doing well has not seen any increase in swelling or redness or drainage.  He has no complaints today.  Last A1c was 13.7.  Objective: AAO x3, NAD DP/PT pulses palpable bilaterally, CRT less than 3 seconds Sensation decreased with Semmes Weinstein monofilament On the right foot submetatarsal 1 ulceration today measuring 0.8 x 0.7 x 0.2 cm with a granular wound base and mild hyperkeratotic periwound.  There is no surrounding erythema.  Minimal edema.  There is no areas of fluctuation or crepitation.  There is no malodor. No pain with calf compression, swelling, warmth, erythema  Assessment: 59 year old male with osteomyelitis, abscess right foot  Plan: -All treatment options discussed with the patient including all alternatives, risks, complications.  -Sharply debrided the wound today utilizing the 312 with scalpel to any complications down to healthy, bleeding, viable tissue.  Would continue daily dressing changes.  Continue antibiotics for now.  Again discussed with him surgical intervention, biopsy but the wound appears to be healing at this point a bone biopsy would not give [place] much information as he is on antibiotics.  He does not want amputation.  Monitor closely for any signs or symptoms of worsening infection directly report to the emergency room should any occur.  *X-ray next appointment   Trula Slade DPM

## 2020-02-23 ENCOUNTER — Telehealth: Payer: Self-pay

## 2020-02-23 ENCOUNTER — Ambulatory Visit: Payer: BC Managed Care – PPO | Admitting: Podiatry

## 2020-02-23 ENCOUNTER — Encounter: Payer: Self-pay | Admitting: Infectious Diseases

## 2020-02-23 ENCOUNTER — Other Ambulatory Visit: Payer: Self-pay | Admitting: Infectious Diseases

## 2020-02-23 ENCOUNTER — Other Ambulatory Visit: Payer: Self-pay

## 2020-02-23 ENCOUNTER — Ambulatory Visit (INDEPENDENT_AMBULATORY_CARE_PROVIDER_SITE_OTHER): Payer: BC Managed Care – PPO | Admitting: Infectious Diseases

## 2020-02-23 VITALS — BP 195/99 | HR 65 | Wt 224.0 lb

## 2020-02-23 DIAGNOSIS — E11628 Type 2 diabetes mellitus with other skin complications: Secondary | ICD-10-CM

## 2020-02-23 DIAGNOSIS — L089 Local infection of the skin and subcutaneous tissue, unspecified: Secondary | ICD-10-CM

## 2020-02-23 DIAGNOSIS — L97509 Non-pressure chronic ulcer of other part of unspecified foot with unspecified severity: Secondary | ICD-10-CM

## 2020-02-23 NOTE — Telephone Encounter (Addendum)
[name] from [facility] called and states the patient will need to be scheduled at the hospital to have the CVC double lumen removed. Please call [facility] and let them know when the patient is scheduled and they will reach out to the patient to teach him how to care for it and flush it until it is removed . Please put order in Epic to have it removed Raymond [name]

## 2020-02-23 NOTE — Telephone Encounter (Signed)
[facility] Patient Advocate Encounter ? ?Insurance verification completed.   ? ?The patient is uninsured and will need patient assistance for medication. ? ?We can complete the application and will need to meet with the patient for signatures and income documentation. ? ?Dalyce Renne, CPhT ?Specialty Pharmacy Patient Advocate ?[facility] ?Phone: [phone] ?Fax:  [phone]  ?

## 2020-02-23 NOTE — Progress Notes (Signed)
[facility]                                                             [place], [place], [place], [address]                                                                  Phn. 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 Stop date 02/24/20. Tunneled line to be removed after completion of IV abx. His wound at looks significantly improved and healed  in comparison to prior images. ESR has been downtrending and CRP is normal. He will continue to follow up with Dr Jacqualyn Posey. Follow up PRN   I spent greater than 30 minutes with the patient including greater than 50% of time in face to face counsel of the patient and in coordination of their care.   Rosiland Oz, MD [facility]  Office phone [phone] Fax no. [phone] ______________________________________________________________________________________________________________________  HPI: Raymond [name] is a 59 y.o. male with CKD/ESRD, status post renal transplant in 2017, diabetes, hypertension, history of LEFT great toe OM s/p  6+ weeks of IV Dapto in Oct-Dec 2020 followed by recent hospital admission in 8/19-8/27 for MRSA bacteremia with source likely Rt foot diabetic ulcer who is here for a hospital follow  up. Patient had a high grade MRSA/E faecalis bacteremia with blood cultures on 8/19 2/2 sets positive for MRSA and E faecalis. TTE and TEE were both negative for vegetations. Patient was started on Daptomycin given elevated CR. MRi rt foot finding suggestive of developing abscess at the plantar medial forefoot at the level of the first MTP joint. Osteonecrosis of the first metatarsal head and great toe proximal phalanx, Small first MTP joint effusion. Patient did not have surgical intervention while in the hospital. ID recommended to continue Daptomycin for 6 weeks with an end date 02/24/2020. Recent ABI 12/2019 bilaterally more than 1.   Patient has been doing well after hospital discharge. Denies any pain/tenderness/swelling/discharge from the area. He is happy that the  wound looks significantly better. Denies any fever, chills, nausea, vomiting except some loose stools, not frequent. Denies any issues with the PICC line. He is concerned that his RT foot looks darker than the Left but says he soaks his RT foot in betadine twice a day and may be the reason why it is dark. He has an appt with Dr Jacqualyn Posey today and will be discussing him about it. He denies smoking, alcohol and using any illicit drugs.   ROS: 11 point ROS negative except as stated above   Past Medical History:  Diagnosis Date  . Chronic kidney disease    Awaiting transplant  . Diabetes ([place])   . Diabetes mellitus without complication ([place])   . Hypertension   . Renal disorder   . Renal insufficiency    Past Surgical History:  Procedure Laterality Date  . CENTRAL VENOUS CATHETER INSERTION Right 01/20/2020   Procedure: INSERTION CENTRAL LINE ADULT; Tunneled central line;  Surgeon: Virl Cagey, MD;  Location: [facility] ORS;  Service: General;  Laterality: Right;  . COLONOSCOPY N/A 12/05/2014   AJG:OTLXBWIO external and internal hemorrhoid/mild diverticulosis/11 polyps removed  . ESOPHAGOGASTRODUODENOSCOPY N/A 12/05/2014   MBT:DHRC  duodenitis  . IR FLUORO GUIDE CV LINE RIGHT  03/01/2019  . IR REMOVAL TUN CV CATH W/O FL  05/10/2019  . IR US GUIDE VASC ACCESS RIGHT  03/01/2019  . KIDNEY TRANSPLANT  03/2015  . TEE WITHOUT CARDIOVERSION N/A 01/17/2020   Procedure: TRANSESOPHAGEAL ECHOCARDIOGRAM (TEE) WITH PROPOFOL;  Surgeon: Arnoldo Lenis, MD;  Location: [facility] END[facility] SUITE;  Service: Endoscopy;  Laterality: N/A;   Current Outpatient Medications on File Prior to Visit  Medication Sig Dispense Refill  . amLODipine (NORVASC) 5 MG tablet Take 5 mg by mouth daily.     . Blood Glucose Monitoring Suppl (ACCU-CHEK GUIDE ME) w/Device KIT 1 Piece by Does not apply route as directed. 1 kit 0  . cyclobenzaprine (FLEXERIL) 5 MG tablet Take 2 tablets (10 mg total) by mouth 3 (three) times daily as needed for muscle spasms. 12 tablet 0  . daptomycin (CUBICIN) IVPB Inject 650 mg into the vein daily. Indication:  osteomyelitis First Dose: No Last Day of Therapy:  02/24/2020 Labs - Once weekly:  CBC/D, BMP, and CPK Labs - Every other week:  ESR and CRP Method of administration: IV Push Method of administration may be changed at the discretion of home infusion pharmacist based upon assessment of the patient and/or caregiver's ability to self-administer the medication ordered. 36 Units 0  . doxazosin (CARDURA) 4 MG tablet Take 6 mg by mouth daily.     [name] glipiZIDE (GLUCOTROL XL) 5 MG 24 hr tablet Take 5 mg by mouth daily.     [name] glucose blood (ACCU-CHEK GUIDE) test strip Use as instructed 4 x daily. E11.65 150 each 5  . hydrALAZINE (APRESOLINE) 25 MG tablet Take 25 mg by mouth 2 (two) times daily.    . hydrOXYzine (ATARAX/VISTARIL) 25 MG tablet Take 25 mg by mouth 3 (three) times daily as needed.    . insulin glargine (LANTUS SOLOSTAR) 100 UNIT/ML Solostar Pen INJECT 60 UNITS SUBCUTANEOUSLY AT BEDTIME 10 pen 2  . Insulin Pen Needle (B-D ULTRAFINE III SHORT PEN) 31G X 8 MM MISC 1 each by Does not apply route as directed. 100 each 3  . Multiple  Vitamin (MULTIVITAMIN) capsule Take 1 capsule by mouth daily.    . sodium bicarbonate 650 MG tablet Take 1,300 mg by mouth 3 (three) times daily.    [name]  tacrolimus (PROGRAF) 1 MG capsule Take 3 mg by mouth 2 (two) times daily. Take 3 capsules (75m) in AM & take 3 caps (346m in PM     . tacrolimus (PROGRAF) 5 MG capsule Take 1 mg by mouth 2 (two) times daily. 3    . triamcinolone (KENALOG) 0.025 % ointment Apply 1 application topically 3 (three) times daily.    . Vitamin D, Ergocalciferol, (DRISDOL) 50000 units CAPS capsule Take 50,000 Units by mouth every 30 (thirty) days.    . Marland KitchenxyCODONE-acetaminophen (PERCOCET/ROXICET) 5-325 MG tablet Take 1 tablet by mouth every 4 (four) hours as needed for severe pain. (Patient not taking: Reported on 02/23/2020) 12 tablet 0   Current Facility-Administered Medications on File Prior to Visit  Medication Dose Route Frequency Provider Last Rate Last Admin  . sodium chloride flush (NS) 0.9 % injection 3 mL  3 mL Intravenous Q12H BeLorretta HarpMD       No Known Allergies  Social History   Socioeconomic History  . Marital status: Married    Spouse name: Not on file  . Number of children: Not on file  . Years of education: Not on file  . Highest education level: Not on file  Occupational History  . Not on file  Tobacco Use  . Smoking status: Never Smoker  . Smokeless tobacco: Never Used  Vaping Use  . Vaping Use: Never used  Substance and Sexual Activity  . Alcohol use: No    Alcohol/week: 0.0 standard drinks  . Drug use: No  . Sexual activity: Not on file  Other Topics Concern  . Not on file  Social History Narrative   ** Merged History Encounter **       Social Determinants of Health   Financial Resource Strain:   . Difficulty of Paying Living Expenses: Not on file  Food Insecurity:   . Worried About RuCharity fundraisern the Last Year: Not on file  . Ran Out of Food in the Last Year: Not on file  Transportation Needs:   . Lack of  Transportation (Medical): Not on file  . Lack of Transportation (Non-Medical): Not on file  Physical Activity:   . Days of Exercise per Week: Not on file  . Minutes of Exercise per Session: Not on file  Stress:   . Feeling of Stress : Not on file  Social Connections:   . Frequency of Communication with Friends and Family: Not on file  . Frequency of Social Gatherings with Friends and Family: Not on file  . Attends Religious Services: Not on file  . Active Member of Clubs or Organizations: Not on file  . Attends ClArchivisteetings: Not on file  . Marital Status: Not on file  Intimate Partner Violence:   . Fear of Current or Ex-Partner: Not on file  . Emotionally Abused: Not on file  . Physically Abused: Not on file  . Sexually Abused: Not on file   Vitals BP (!) 195/99 Comment: Hasn't put on patch for bp yet  Pulse 65   Wt 224 lb (101.6 kg)   SpO2 99%   BMI 33.08 kg/m   Examination  General - not in acute distress, comfortably sitting in chair HEENT - PEERLA, no pallor and no icterus Chest - b/l clear air entry, no additional sounds CVS- Normal s1s2, RRR Abdomen - Soft, OBESE ABDOMEN, BS+ Ext-  Rt FOOT   LT FOOT WOUND NEAR THE 1ST MPJ HEALED Neuro: grossly normal  Back - WNL Psych : calm and cooperative   Recent labs CBC Latest Ref Rng & Units 01/20/2020 01/19/2020 01/17/2020  WBC 4.0 - 10.5 K/uL 7.7 5.8 6.1  Hemoglobin 13.0 - 17.0 g/dL 12.5(L) 12.1(L) 12.6(L)  Hematocrit 39 - 52 % 41.7 41.0 42.3  Platelets 150 - 400 K/uL 195 176 144(L)   CMP Latest Ref Rng & Units 01/20/2020 01/19/2020 01/17/2020  Glucose 70 - 99 mg/dL 163(H) 140(H) 240(H)  BUN 6 - 20 mg/dL 23(H) 22(H) 23(H)  Creatinine 0.61 - 1.24 mg/dL 1.14 1.09 1.05  Sodium 135 - 145 mmol/L 137 136 138  Potassium 3.5 - 5.1 mmol/L 5.1 4.6 4.1  Chloride 98 - 111 mmol/L 110 108 111  CO2 22 - 32 mmol/L 20(L) 22 19(L)  Calcium 8.9 - 10.3 mg/dL 9.2 9.1 8.9  Total Protein 6.5 - 8.1 g/dL - - -  Total  Bilirubin 0.3 - 1.2 mg/dL - - -  Alkaline Phos 38 - 126 U/L - - -  AST 15 - 41 U/L - - -  ALT 0 - 44 U/L - - -     Pertinent Microbiology Results for orders placed or performed during the hospital encounter of 01/12/20  SARS Coronavirus 2 by RT PCR (hospital order, performed in [place] hospital lab) Nasopharyngeal Nasopharyngeal Swab     Status: None   Collection Time: 01/12/20  4:06 PM   Specimen: Nasopharyngeal Swab  Result Value Ref Range Status   SARS Coronavirus 2 NEGATIVE NEGATIVE Final    Comment: (NOTE) SARS-CoV-2 target nucleic acids are NOT DETECTED.  The SARS-CoV-2 RNA is generally detectable in upper and lower respiratory specimens during the acute phase of infection. The lowest concentration of SARS-CoV-2 viral copies this assay can detect is 250 copies / mL. A negative result does not preclude SARS-CoV-2 infection and should not be used as the sole basis for treatment or other patient management decisions.  A negative result may occur with improper specimen collection / handling, submission of specimen other than nasopharyngeal swab, presence of viral mutation(s) within the areas targeted by this assay, and inadequate number of viral copies (<250 copies / mL). A negative result must be combined with clinical observations, patient history, and epidemiological information.  Fact Sheet for Patients:   StrictlyIdeas.no  Fact Sheet for Healthcare Providers: BankingDealers.co.za  This test is not yet approved or  cleared by the [place] FDA and has been authorized for detection and/or diagnosis of SARS-CoV-2 by FDA under an Emergency Use Authorization (EUA).  This EUA will remain in effect (meaning this test can be used) for the duration of the COVID-19 declaration under Section 564(b)(1) of the Act, 21 U.S.C. section 360bbb-3(b)(1), unless the authorization is terminated or revoked sooner.  Performed at [facility], [address], [place], [place] [address]   Culture, blood (routine x 2)     Status: Abnormal   Collection Time: 01/12/20  6:16 PM   Specimen: BLOOD RIGHT HAND  Result Value Ref Range Status   Specimen Description   Final    BLOOD RIGHT HAND Performed at [facility], [address] [place], [place] [address]    Special Requests   Final    BOTTLES DRAWN AEROBIC AND ANAEROBIC Blood Culture adequate volume Performed at [facility], [address] [place], [place] [address]    Culture  Setup Time   Final    ANAEROBIC and AEROBIC BOTTLE GRAM POSITIVE COCCI Gram Stain Report Called to,Read Back By and Verified With:  DEAN,T'@1103'  BY MATTHEWS, B 8.20.2021 [facility] Organism ID to follow CRITICAL RESULT CALLED TO, READ BACK BY AND VERIFIED WITH: A,MUHAMMAD RN '@1947'  01/13/20 EB    Culture (A)  Final    METHICILLIN RESISTANT STAPHYLOCOCCUS AUREUS ENTEROCOCCUS FAECALIS CRITICAL RESULT CALLED TO, READ BACK BY AND VERIFIED WITH: PHARMD L. POOLE [id number] FCP SEE SEPARATE REPORT FOR DAPTOMYCIN RESULTS Performed at [facility], [place] [address], [place], [place] [address]    Report Status 02/18/2020 FINAL  Final   Organism ID, Bacteria METHICILLIN RESISTANT STAPHYLOCOCCUS AUREUS  Final   Organism ID, Bacteria ENTEROCOCCUS FAECALIS  Final      Susceptibility   Enterococcus faecalis - MIC*    AMPICILLIN <=2 SENSITIVE Sensitive     VANCOMYCIN 1 SENSITIVE Sensitive     GENTAMICIN SYNERGY SENSITIVE Sensitive     * ENTEROCOCCUS FAECALIS   Methicillin resistant staphylococcus aureus - MIC*    CIPROFLOXACIN >=8 RESISTANT Resistant     ERYTHROMYCIN >=8 RESISTANT Resistant     GENTAMICIN <=0.5 SENSITIVE Sensitive     OXACILLIN >=4 RESISTANT Resistant     TETRACYCLINE >=16 RESISTANT Resistant     VANCOMYCIN <=0.5 SENSITIVE Sensitive     TRIMETH/SULFA <=10 SENSITIVE Sensitive     CLINDAMYCIN <=0.25 SENSITIVE Sensitive     RIFAMPIN <=0.5 SENSITIVE Sensitive     Inducible  Clindamycin NEGATIVE Sensitive     * METHICILLIN RESISTANT STAPHYLOCOCCUS AUREUS  Culture, blood (routine x 2)     Status: Abnormal   Collection Time: 01/12/20  6:16 PM   Specimen: BLOOD RIGHT HAND  Result Value Ref Range Status   Specimen Description   Final    BLOOD RIGHT HAND Performed at [facility], [address] [place], [place] [address]    Special Requests   Final    BOTTLES DRAWN AEROBIC AND ANAEROBIC Blood Culture adequate volume Performed at [facility], [address] [place], [place] [address]    Culture  Setup Time   Final    ANAEROBIC AND AEROBIC BOTTLES GRAM POSITIVE COCCI Gram Stain Report Called to,Read Back By and Verified With: DEAN,T'@1103'  BY MATTHEWS, B 8.20.21 [facility] CRITICAL VALUE NOTED.  VALUE IS CONSISTENT WITH PREVIOUSLY REPORTED AND CALLED VALUE.    Culture (A)  Final    STAPHYLOCOCCUS AUREUS ENTEROCOCCUS FAECALIS SUSCEPTIBILITIES PERFORMED ON PREVIOUS CULTURE WITHIN THE LAST 5 DAYS. Performed at [facility], [place] [address] [place],  [address]    Report Status 02/18/2020 FINAL  Final  Blood Culture ID Panel (Reflexed)     Status: Abnormal   Collection Time: 01/12/20  6:16 PM  Result Value Ref Range Status   Enterococcus faecalis NOT DETECTED NOT DETECTED Final   Enterococcus Faecium NOT DETECTED NOT DETECTED Final   Listeria monocytogenes NOT DETECTED NOT DETECTED Final   Staphylococcus species DETECTED (A) NOT DETECTED Final    Comment: RESULT CALLED TO, READ BACK BY AND VERIFIED WITH: A,MUHAMMAD RN '@1947'  01/13/20 EB    Staphylococcus aureus (BCID) DETECTED (A) NOT DETECTED Final    Comment: Methicillin (oxacillin)-resistant Staphylococcus aureus (MRSA). MRSA is predictably resistant to beta-lactam antibiotics (except ceftaroline). Preferred therapy is vancomycin unless clinically contraindicated. Patient requires contact precautions if  hospitalized. RESULT CALLED TO, READ BACK BY AND VERIFIED WITH: A,MUHAMMAD RN  '@1947'  01/13/20 EB    Staphylococcus epidermidis NOT DETECTED NOT DETECTED Final   Staphylococcus lugdunensis NOT DETECTED NOT DETECTED Final   Streptococcus species NOT DETECTED NOT DETECTED Final   Streptococcus agalactiae  NOT DETECTED NOT DETECTED Final   Streptococcus pneumoniae NOT DETECTED NOT DETECTED Final   Streptococcus pyogenes NOT DETECTED NOT DETECTED Final   A.calcoaceticus-baumannii NOT DETECTED NOT DETECTED Final   Bacteroides fragilis NOT DETECTED NOT DETECTED Final   Enterobacterales NOT DETECTED NOT DETECTED Final   Enterobacter cloacae complex NOT DETECTED NOT DETECTED Final   Escherichia coli NOT DETECTED NOT DETECTED Final   Klebsiella aerogenes NOT DETECTED NOT DETECTED Final   Klebsiella oxytoca NOT DETECTED NOT DETECTED Final   Klebsiella pneumoniae NOT DETECTED NOT DETECTED Final   Proteus species NOT DETECTED NOT DETECTED Final   Salmonella species NOT DETECTED NOT DETECTED Final   Serratia marcescens NOT DETECTED NOT DETECTED Final   Haemophilus influenzae NOT DETECTED NOT DETECTED Final   Neisseria meningitidis NOT DETECTED NOT DETECTED Final   Pseudomonas aeruginosa NOT DETECTED NOT DETECTED Final   Stenotrophomonas maltophilia NOT DETECTED NOT DETECTED Final   Candida albicans NOT DETECTED NOT DETECTED Final   Candida auris NOT DETECTED NOT DETECTED Final   Candida glabrata NOT DETECTED NOT DETECTED Final   Candida krusei NOT DETECTED NOT DETECTED Final   Candida parapsilosis NOT DETECTED NOT DETECTED Final   Candida tropicalis NOT DETECTED NOT DETECTED Final   Cryptococcus neoformans/gattii NOT DETECTED NOT DETECTED Final   Meth resistant mecA/C and MREJ DETECTED (A) NOT DETECTED Final    Comment: RESULT CALLED TO, READ BACK BY AND VERIFIED WITH: A,MUHAMMAD RN '@1947'  01/13/20 EB Performed at [facility], [address] [place], [place] [address]   Culture, blood (Routine X 2) w Reflex to ID Panel     Status: None   Collection Time: 01/13/20   9:04 PM   Specimen: BLOOD RIGHT HAND  Result Value Ref Range Status   Specimen Description BLOOD RIGHT HAND  Final   Special Requests   Final    BOTTLES DRAWN AEROBIC AND ANAEROBIC Blood Culture adequate volume   Culture   Final    NO GROWTH 5 DAYS Performed at [facility], [address], [place], [place] [address]    Report Status 01/18/2020 FINAL  Final  Culture, blood (Routine X 2) w Reflex to ID Panel     Status: None   Collection Time: 01/13/20  9:04 PM   Specimen: BLOOD RIGHT HAND  Result Value Ref Range Status   Specimen Description BLOOD RIGHT HAND  Final   Special Requests   Final    BOTTLES DRAWN AEROBIC AND ANAEROBIC Blood Culture adequate volume   Culture   Final    NO GROWTH 5 DAYS Performed at [name], [address] [place], [place] [address]    Report Status 01/18/2020 FINAL  Final  Culture, blood (Routine X 2) w Reflex to ID Panel     Status: None   Collection Time: 01/15/20  1:46 PM   Specimen: BLOOD RIGHT HAND  Result Value Ref Range Status   Specimen Description   Final    BLOOD RIGHT HAND BOTTLES DRAWN AEROBIC AND ANAEROBIC   Special Requests Blood Culture adequate volume  Final   Culture   Final    NO GROWTH 5 DAYS Performed at [facility], [address], [place], [place] [address]    Report Status 01/20/2020 FINAL  Final  Culture, blood (Routine X 2) w Reflex to ID Panel     Status: None   Collection Time: 01/15/20  1:46 PM   Specimen: Right Antecubital; Blood  Result Value Ref Range Status   Specimen Description  Final    RIGHT ANTECUBITAL BOTTLES DRAWN AEROBIC AND ANAEROBIC   Special Requests Blood Culture adequate volume  Final   Culture   Final    NO GROWTH 5 DAYS Performed at [facility], [address], [place], [place] [address]    Report Status 01/20/2020 FINAL  Final    Pertinent Imaging    All pertinent labs/Imagings/notes reviewed. All pertinent plain films and CT images have been personally visualized and interpreted;  radiology reports have been reviewed. Decision making incorporated into the Impression / Recommendations.  FINDINGS: Bones/Joint/Cartilage  Chronic changes to the first metatarsal head with subchondral collapse and subchondral cystic change. There is reactive sclerosis and subchondral cystic change of the opposing great toe proximal phalanx base, more pronounced along the medial aspect. Small first MTP joint effusion. The medial hallux sesamoid also appears sclerotic with heterogeneous T2 hyperintense appearance which may reflect sequela of chronic sesamoiditis. Mild degenerative changes within the midfoot including at the second metatarsal base and within the lateral cuneiform. No acute fracture. No dislocation. No bony erosion or cortical destruction.  Ligaments  Intact Lisfranc ligament. Collateral ligaments of the forefoot appear intact.  Muscles and Tendons  Fatty atrophy of the intrinsic foot musculature suggesting chronic denervation changes. Intact flexor and extensor tendons without tenosynovitis.  Soft tissues  Plantar soft tissue ulceration underlies the medial forefoot at the level of the first MTP joint. Ulcer base is contiguous with a fluid and air containing collection measuring approximately 2.1 x 0.8 x 1.8 cm (series 9, image 20; series 5, image 16) suggesting a developing abscess.  IMPRESSION: 1. Plantar soft tissue ulceration underlies the medial forefoot at the level of the first MTP joint. Ulcer base is contiguous with a fluid and air containing collection measuring 2.1 x 0.8 x 1.8 cm suggesting a developing abscess. 2. Chronic bony changes to the first metatarsal head and great toe proximal phalanx with subchondral collapse and subchondral cystic change suggesting osteonecrosis. Findings do not appear appreciably changed compared to multiple prior radiographs. Although felt to be less likely, signal changes related to osteomyelitis would  be difficult to entirely exclude. 3. Small first MTP joint effusion is likely reactive. Septic arthritis not excluded.of chronic sesamoiditis 4. Additional findings suggestive    US arterial ABI 01/13/20 FINDINGS: Right ABI:  1.04  Left ABI:  1.09  Right Lower Extremity: Segmental Doppler at the right ankle demonstrates monophasic dorsalis pedis and posterior tibial artery  Left Lower Extremity: Segmental Doppler on the left demonstrates triphasic posterior tibial artery and monophasic dorsalis pedis  IMPRESSION: Right:  Resting ABI within normal limits, though potentially falsely elevated.  Segmental Doppler at the ankle demonstrates monophasic waveforms, indicating more proximal occlusive disease.  Left:  Resting ABI within normal limits, though potentially falsely elevated.  Segmental exam demonstrates waveform maintained in the posterior tibial artery and monophasic in the dorsalis pedis.

## 2020-02-23 NOTE — Progress Notes (Unsigned)
isc

## 2020-02-23 NOTE — Telephone Encounter (Addendum)
Spoke with patient and Interventional Radiology, was able to schedule him for 02/29/2020 at 10:00 a.m, patient was made aware of this appointment and confirmed it. I also called back [facility] and spoke with Debbie to let her know about IR appointment. This was confirmed and she relayed back to me she'd inform Stanton Kidney of the appointment per her request. Consuelo Pandy

## 2020-02-23 NOTE — Telephone Encounter (Signed)
OK.  Will do.

## 2020-02-23 NOTE — Telephone Encounter (Signed)
Per Dr. [place] Bali order called Stanton Kidney at [facility] and relayed verbal order, to pull PICC line on 02/25/2020. Order was repeated and verified. Consuelo Pandy

## 2020-02-24 ENCOUNTER — Ambulatory Visit (INDEPENDENT_AMBULATORY_CARE_PROVIDER_SITE_OTHER): Payer: BC Managed Care – PPO

## 2020-02-24 ENCOUNTER — Ambulatory Visit (INDEPENDENT_AMBULATORY_CARE_PROVIDER_SITE_OTHER): Payer: BC Managed Care – PPO | Admitting: Podiatry

## 2020-02-24 DIAGNOSIS — E0843 Diabetes mellitus due to underlying condition with diabetic autonomic (poly)neuropathy: Secondary | ICD-10-CM

## 2020-02-24 DIAGNOSIS — L97512 Non-pressure chronic ulcer of other part of right foot with fat layer exposed: Secondary | ICD-10-CM

## 2020-02-24 DIAGNOSIS — M86171 Other acute osteomyelitis, right ankle and foot: Secondary | ICD-10-CM

## 2020-02-24 DIAGNOSIS — L97419 Non-pressure chronic ulcer of right heel and midfoot with unspecified severity: Secondary | ICD-10-CM | POA: Diagnosis not present

## 2020-02-28 NOTE — Progress Notes (Signed)
Subjective: 59 year old male presents the office today for follow-up of infection, abscess to his right foot. He is still on daptomycin and tolerating well any side effects. He scheduled stop antibiotics next Wednesday. He states the foot is doing well has not seen any increase in swelling or redness or drainage.  He has no complaints today.  Last A1c was 13.7.  Objective: AAO x3, NAD DP/PT pulses palpable bilaterally, CRT less than 3 seconds Sensation decreased with Semmes Weinstein monofilament On the right foot submetatarsal 1 ulceration today prior to debridement measures about 0.4 x 0.4 x 0.2 cm after debridement measures 0. 6 x 0.5 cm x 0.2 cm. There is no probing, undermining or tunneling. There is no surrounding erythema, ascending cellulitis but there is minimal edema. Is no warmth of the foot. There is no fluctuation capitation. There is no malodor. No pain with calf compression, swelling, warmth, erythema  Assessment: 59 year old male ulceration right foot, osteomyelitis  Plan: -All treatment options discussed with the patient including all alternatives, risks, complications.  -X-rays obtained reviewed. Reviewed the x-rays with him there is no significant change. Awaiting radiology report. -Sharply debrided the wound today utilizing a number 312 scalpel as well as a tissue nipper without any complications down to healthy, bleeding, viable tissue with minimal blood loss. Continue daily dressing changes. Elevation offloading. -Continue antibiotics per infectious disease.  Trula Slade DPM

## 2020-02-29 ENCOUNTER — Other Ambulatory Visit: Payer: Self-pay

## 2020-02-29 ENCOUNTER — Ambulatory Visit (HOSPITAL_COMMUNITY)
Admission: RE | Admit: 2020-02-29 | Discharge: 2020-02-29 | Disposition: A | Discharge status: Home / Self Care | Payer: BC Managed Care – PPO | Source: Ambulatory Visit | Attending: Infectious Diseases | Admitting: Infectious Diseases

## 2020-02-29 DIAGNOSIS — E11621 Type 2 diabetes mellitus with foot ulcer: Secondary | ICD-10-CM | POA: Diagnosis not present

## 2020-02-29 DIAGNOSIS — Z452 Encounter for adjustment and management of vascular access device: Secondary | ICD-10-CM | POA: Diagnosis not present

## 2020-02-29 DIAGNOSIS — L97509 Non-pressure chronic ulcer of other part of unspecified foot with unspecified severity: Secondary | ICD-10-CM | POA: Diagnosis not present

## 2020-02-29 DIAGNOSIS — M869 Osteomyelitis, unspecified: Secondary | ICD-10-CM | POA: Insufficient documentation

## 2020-02-29 HISTORY — PX: IR REMOVAL TUN CV CATH W/O FL: IMG2289

## 2020-02-29 MED ORDER — LIDOCAINE HCL 1 % IJ SOLN
INTRAMUSCULAR | Status: AC
  Filled 2020-02-29: qty 20

## 2020-03-01 ENCOUNTER — Ambulatory Visit: Payer: BC Managed Care – PPO | Admitting: Podiatry

## 2020-03-09 ENCOUNTER — Ambulatory Visit (INDEPENDENT_AMBULATORY_CARE_PROVIDER_SITE_OTHER): Payer: BC Managed Care – PPO | Admitting: Podiatry

## 2020-03-09 ENCOUNTER — Other Ambulatory Visit: Payer: Self-pay

## 2020-03-09 DIAGNOSIS — L97512 Non-pressure chronic ulcer of other part of right foot with fat layer exposed: Secondary | ICD-10-CM

## 2020-03-09 DIAGNOSIS — M86171 Other acute osteomyelitis, right ankle and foot: Secondary | ICD-10-CM

## 2020-03-09 DIAGNOSIS — I739 Peripheral vascular disease, unspecified: Secondary | ICD-10-CM | POA: Diagnosis not present

## 2020-03-09 DIAGNOSIS — E0843 Diabetes mellitus due to underlying condition with diabetic autonomic (poly)neuropathy: Secondary | ICD-10-CM | POA: Diagnosis not present

## 2020-03-09 MED ORDER — MUPIROCIN 2 % EX OINT: 1.0000 "application " | TOPICAL_OINTMENT | Freq: Two times a day (BID) | CUTANEOUS | 2 refills | Status: DC

## 2020-03-10 NOTE — Progress Notes (Signed)
  Subjective:  Patient ID: Raymond [name], male    DOB: May 01, 1961,  MRN: [id number]  Chief Complaint  Patient presents with  . Wound Check    pt is here for a wound check of the right foot. Pt shows no signs of infection, pt is well bandaged as well.    59 y.o. male presents with the above complaint. History confirmed with patient.  He rates his pain as 3 out of 10  Objective:  Physical Exam: Foot is warm and well-perfused with brisk capillary fill time, the right foot there is a submetatarsal 1 ulceration measuring 0.2 cm x 0.2 cm x 0.2 cm, post debridement of hyperkeratosis reveals measurements of 0.6 cm x 0.5 cm x 0.3 cm.  No drainage, no edema, no sinus tract.  No exposed bone tendon or capsule.  Assessment:   1. Osteomyelitis of ankle or foot, right, acute ([place])   2. Foot ulcer, right, with fat layer exposed ([place])   3. Diabetes mellitus due to underlying condition with diabetic autonomic neuropathy, unspecified whether long term insulin use ([place])   4. PAD (peripheral artery disease) ([place])      Plan:  Patient was evaluated and treated and all questions answered.   -Dressing applied consisting of medihoney -Wound cleansed and debrided -Prescription for mupirocin ointment sent to his pharmacy apply daily  Procedure: Selective Debridement of Wound Rationale: Removal of devitalized tissue from the wound to promote healing.  Pre-Debridement Wound Measurements:0.2 cm x 0.2 cm x 0.2 cm Post-Debridement Wound Measurements:0.6 cm x 0.5 cm x 0.3 cm Type of Debridement: sharp selective Tissue Removed: Devitalized soft-tissue Dressing: Dry, sterile, compression dressing. Disposition: Patient tolerated procedure well. Patient to return in 1 week for follow-up.   Return in about 2 weeks (around 03/23/2020) for wound re-check with Dr Jacqualyn Posey.

## 2020-03-13 ENCOUNTER — Other Ambulatory Visit: Payer: Self-pay | Admitting: "Endocrinology

## 2020-03-13 ENCOUNTER — Telehealth: Payer: Self-pay

## 2020-03-13 NOTE — Telephone Encounter (Signed)
Left a message requesting pt return a call to the office. 

## 2020-03-13 NOTE — Telephone Encounter (Signed)
What labs does pt need or can he do A1C in office?

## 2020-03-13 NOTE — Telephone Encounter (Signed)
A1c here will be fine.

## 2020-03-14 ENCOUNTER — Other Ambulatory Visit: Payer: Self-pay

## 2020-03-14 ENCOUNTER — Ambulatory Visit (INDEPENDENT_AMBULATORY_CARE_PROVIDER_SITE_OTHER): Payer: BC Managed Care – PPO | Admitting: "Endocrinology

## 2020-03-14 ENCOUNTER — Encounter: Payer: Self-pay | Admitting: "Endocrinology

## 2020-03-14 VITALS — BP 166/82 | HR 60 | Ht 69.0 in | Wt 224.8 lb

## 2020-03-14 DIAGNOSIS — IMO0002 Reserved for concepts with insufficient information to code with codable children: Secondary | ICD-10-CM

## 2020-03-14 DIAGNOSIS — Z9119 Patient's noncompliance with other medical treatment and regimen: Secondary | ICD-10-CM | POA: Diagnosis not present

## 2020-03-14 DIAGNOSIS — E782 Mixed hyperlipidemia: Secondary | ICD-10-CM | POA: Diagnosis not present

## 2020-03-14 DIAGNOSIS — N186 End stage renal disease: Secondary | ICD-10-CM

## 2020-03-14 DIAGNOSIS — Z91199 Patient's noncompliance with other medical treatment and regimen due to unspecified reason: Secondary | ICD-10-CM

## 2020-03-14 DIAGNOSIS — I10 Essential (primary) hypertension: Secondary | ICD-10-CM

## 2020-03-14 DIAGNOSIS — E1165 Type 2 diabetes mellitus with hyperglycemia: Secondary | ICD-10-CM

## 2020-03-14 DIAGNOSIS — E1122 Type 2 diabetes mellitus with diabetic chronic kidney disease: Secondary | ICD-10-CM | POA: Diagnosis not present

## 2020-03-14 MED ORDER — LANTUS SOLOSTAR 100 UNIT/ML ~~LOC~~ SOPN
80.0000 [IU] | PEN_INJECTOR | Freq: Every day | SUBCUTANEOUS | 3 refills | Status: DC
Start: 2020-03-14 — End: 2020-03-27

## 2020-03-14 NOTE — Progress Notes (Signed)
03/14/2020, 9:01 AM                                              Endocrinology follow-up note   Subjective:    Patient ID: Raymond [name], male    DOB: Mar 31, 1961.  Karlo Goeden is being seen in follow-up for  management of his currently uncontrolled type 2 diabetes complicated by end-stage renal disease status post renal transplant, hypertension, hyperlipidemia . -He missed his appointment since March 2021.  He is known to have significant noncompliance to medical recommendations and follow-ups.   Past Medical History:  Diagnosis Date  . Chronic kidney disease    Awaiting transplant  . Diabetes ([place])   . Diabetes mellitus without complication ([place])   . Hypertension   . Renal disorder   . Renal insufficiency    Past Surgical History:  Procedure Laterality Date  . CENTRAL VENOUS CATHETER INSERTION Right 01/20/2020   Procedure: INSERTION CENTRAL LINE ADULT; Tunneled central line;  Surgeon: Virl Cagey, MD;  Location: [facility];  Service: General;  Laterality: Right;  . COLONOSCOPY N/A 12/05/2014   GXQ:JJHERDEY external and internal hemorrhoid/mild diverticulosis/11 polyps removed  . ESOPHAGOGASTRODUODENOSCOPY N/A 12/05/2014   CXK:GYJE duodenitis  . IR FLUORO GUIDE CV LINE RIGHT  03/01/2019  . IR REMOVAL TUN CV CATH W/O FL  05/10/2019  . IR REMOVAL TUN CV CATH W/O FL  02/29/2020  . IR US GUIDE VASC ACCESS RIGHT  03/01/2019  . KIDNEY TRANSPLANT  03/2015  . TEE WITHOUT CARDIOVERSION N/A 01/17/2020   Procedure: TRANSESOPHAGEAL ECHOCARDIOGRAM (TEE) WITH PROPOFOL;  Surgeon: Arnoldo Lenis, MD;  Location: [facility];  Service: Endoscopy;  Laterality: N/A;   Social History   Socioeconomic History  . Marital status: Married    Spouse name: Not on file  . Number of children: Not on file  . Years of education: Not on file  . Highest education level: Not on file  Occupational History  . Not on file  Tobacco Use  . Smoking status: Never  Smoker  . Smokeless tobacco: Never Used  Vaping Use  . Vaping Use: Never used  Substance and Sexual Activity  . Alcohol use: No    Alcohol/week: 0.0 standard drinks  . Drug use: No  . Sexual activity: Not on file  Other Topics Concern  . Not on file  Social History Narrative   ** Merged History Encounter **       Social Determinants of Health   Financial Resource Strain:   . Difficulty of Paying Living Expenses: Not on file  Food Insecurity:   . Worried About Charity fundraiser in the Last Year: Not on file  . Ran Out of Food in the Last Year: Not on file  Transportation Needs:   . Lack of Transportation (Medical): Not on file  . Lack of Transportation (Non-Medical): Not on file  Physical Activity:   . Days of Exercise per Week: Not on file  . Minutes of Exercise per Session: Not on file  Stress:   . Feeling of Stress : Not on file  Social Connections:   . Frequency of Communication with Friends and Family: Not on file  . Frequency of Social Gatherings with Friends and Family: Not on file  . Attends Religious Services: Not on file  .  Active Member of Clubs or Organizations: Not on file  . Attends Archivist Meetings: Not on file  . Marital Status: Not on file   Outpatient Encounter Medications as of 03/14/2020  Medication Sig  . cloNIDine (CATAPRES - DOSED IN MG/24 HR) 0.3 mg/24hr patch Place 0.3 mg onto the skin once a week.  [name] amLODipine (NORVASC) 5 MG tablet Take 5 mg by mouth daily.   . Blood Glucose Monitoring Suppl (ACCU-CHEK GUIDE ME) w/Device KIT 1 Piece by Does not apply route as directed.  . cyclobenzaprine (FLEXERIL) 5 MG tablet Take 2 tablets (10 mg total) by mouth 3 (three) times daily as needed for muscle spasms.  . daptomycin (CUBICIN) IVPB Inject 650 mg into the vein daily. Indication:  osteomyelitis First Dose: No Last Day of Therapy:  02/24/2020 Labs - Once weekly:  CBC/D, BMP, and CPK Labs - Every other week:  ESR and CRP Method of  administration: IV Push Method of administration may be changed at the discretion of home infusion pharmacist based upon assessment of the patient and/or caregiver's ability to self-administer the medication ordered.  . doxazosin (CARDURA) 4 MG tablet Take 6 mg by mouth daily.   [name] glipiZIDE (GLUCOTROL XL) 5 MG 24 hr tablet Take 5 mg by mouth daily.   [name] glucose blood (ACCU-CHEK GUIDE) test strip Use as instructed 4 x daily. E11.65  . hydrALAZINE (APRESOLINE) 25 MG tablet Take 25 mg by mouth 2 (two) times daily.  . hydrOXYzine (ATARAX/VISTARIL) 25 MG tablet Take 25 mg by mouth 3 (three) times daily as needed.  . insulin glargine (LANTUS SOLOSTAR) 100 UNIT/ML Solostar Pen Inject 80 Units into the skin at bedtime.  . Insulin Pen Needle (B-D ULTRAFINE III SHORT PEN) 31G X 8 MM MISC 1 each by Does not apply route as directed.  . Multiple Vitamin (MULTIVITAMIN) capsule Take 1 capsule by mouth daily.  . mupirocin ointment (BACTROBAN) 2 % Apply 1 application topically 2 (two) times daily.  [name] oxyCODONE-acetaminophen (PERCOCET/ROXICET) 5-325 MG tablet Take 1 tablet by mouth every 4 (four) hours as needed for severe pain.  . sodium bicarbonate 650 MG tablet Take 1,300 mg by mouth 3 (three) times daily.  . tacrolimus (PROGRAF) 1 MG capsule Take 3 mg by mouth 2 (two) times daily. Take 3 capsules (69m) in AM & take 3 caps (367m in PM   . tacrolimus (PROGRAF) 5 MG capsule Take 1 mg by mouth 2 (two) times daily. 3  . triamcinolone (KENALOG) 0.025 % ointment Apply 1 application topically 3 (three) times daily.  . Vitamin D, Ergocalciferol, (DRISDOL) 50000 units CAPS capsule Take 50,000 Units by mouth every 30 (thirty) days.  . [DISCONTINUED] insulin glargine (LANTUS SOLOSTAR) 100 UNIT/ML Solostar Pen INJECT 60 UNITS SUBCUTANEOUSLY AT BEDTIME   Facility-Administered Encounter Medications as of 03/14/2020  Medication  . sodium chloride flush (NS) 0.9 % injection 3 mL    ALLERGIES: No Known  Allergies  VACCINATION STATUS: Immunization History  Administered Date(s) Administered  . Influenza,inj,Quad PF,6+ Mos 02/22/2019  . Influenza-Unspecified 03/05/2015, 03/09/2016  . PFIZER SARS-COV-2 Vaccination 09/02/2019, 09/30/2019    Diabetes He presents for his follow-up diabetic visit. He has type 2 diabetes mellitus. Onset time: He was diagnosed at approximate age of [age]. His disease course has been worsening (Patient was seen in September 2019, was supposed to return 10 days from his last visit.  He continues to no-show and canceled several appointments.  His recent labs show A1c still high at 12.4%.).  There are no hypoglycemic associated symptoms. Pertinent negatives for hypoglycemia include no confusion, headaches, pallor or seizures. Associated symptoms include blurred vision, foot paresthesias, foot ulcerations, polydipsia and polyuria. Pertinent negatives for diabetes include no chest pain, no fatigue, no polyphagia and no weakness. There are no hypoglycemic complications. Symptoms are worsening. Diabetic complications include nephropathy, PVD and retinopathy. (He had end-stage renal disease as a complication, status post cadaveric kidney transplant in 2016.) Risk factors for coronary artery disease include dyslipidemia, diabetes mellitus, family history, hypertension, male sex, obesity and sedentary lifestyle. Current diabetic treatment includes insulin injections (He is currently on Lantus 60 units nightly, glipizide 5 mg p.o. daily at breakfast.). He is compliant with treatment none of the time. His weight is fluctuating minimally. He is following a generally unhealthy diet. When asked about meal planning, he reported none. He has not had a previous visit with a dietitian. He never participates in exercise. There is no compliance with monitoring of blood glucose. His home blood glucose trend is increasing steadily. (Tragically, Egbert remains on committed.  Missed his appointment since  March, returning with no meter nor logs.  His A1c is increasing to 13.7% from 10.6%.   ) An ACE inhibitor/angiotensin II receptor blocker is not being taken.  Hyperlipidemia This is a chronic problem. The current episode started more than 1 year ago. Exacerbating diseases include chronic renal disease, diabetes and obesity. Pertinent negatives include no chest pain, myalgias or shortness of breath. Current antihyperlipidemic treatment includes statins (Is currently on NPH 40 units nightly.). Risk factors for coronary artery disease include diabetes mellitus, dyslipidemia, family history, obesity, male sex, hypertension and a sedentary lifestyle.  Hypertension This is a chronic problem. The current episode started more than 1 year ago. The problem is controlled. Associated symptoms include blurred vision. Pertinent negatives include no chest pain, headaches, neck pain, palpitations or shortness of breath. Risk factors for coronary artery disease include dyslipidemia, diabetes mellitus, male gender, obesity, sedentary lifestyle and family history. Past treatments include central alpha agonists. Hypertensive end-organ damage includes kidney disease, PVD and retinopathy. Patient is status post kidney transplant in 2016.. Identifiable causes of hypertension include chronic renal disease.   ROS: Limited as above.    Objective:    BP (!) 166/82   Pulse 60   Ht '5\' 9"'  (1.753 m)   Wt 224 lb 12.8 oz (102 kg)   BMI 33.20 kg/m   Wt Readings from Last 3 Encounters:  03/14/20 224 lb 12.8 oz (102 kg)  02/23/20 224 lb (101.6 kg)  01/20/20 214 lb (97.1 kg)    CMP Latest Ref Rng & Units 01/20/2020 01/19/2020 01/17/2020  Glucose 70 - 99 mg/dL 163(H) 140(H) 240(H)  BUN 6 - 20 mg/dL 23(H) 22(H) 23(H)  Creatinine 0.61 - 1.24 mg/dL 1.14 1.09 1.05  Sodium 135 - 145 mmol/L 137 136 138  Potassium 3.5 - 5.1 mmol/L 5.1 4.6 4.1  Chloride 98 - 111 mmol/L 110 108 111  CO2 22 - 32 mmol/L 20(L) 22 19(L)  Calcium 8.9 -  10.3 mg/dL 9.2 9.1 8.9  Total Protein 6.5 - 8.1 g/dL - - -  Total Bilirubin 0.3 - 1.2 mg/dL - - -  Alkaline Phos 38 - 126 U/L - - -  AST 15 - 41 U/L - - -  ALT 0 - 44 U/L - - -   Lipid Panel     Component Value Date/Time   CHOL 183 12/12/2019 1224   TRIG 94 12/12/2019 1224   HDL 41  12/12/2019 1224   CHOLHDL 4.5 12/12/2019 1224   LDLCALC 125 (H) 12/12/2019 1224   LABVLDL 17 12/12/2019 1224      Results for IRVEN, INGALSBE (MRN [id number]) as of 03/14/2020 08:45  Ref. Range 06/14/2018 11:43 07/26/2018 00:00 05/30/2019 00:00 12/12/2019 12:24 01/12/2020 13:28  Hemoglobin A1C Latest Ref Range: 4.8 - 5.6 % >14.0 (H) 12.4 10.6 13.3 (H) 13.7 (H)    Assessment & Plan:   1. 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) ([place]) status post cadaveric renal transplant 2016 - Jasun Gasparini has chronically uncontrolled, complicated , symptomatic type 2 DM since 59 years of age. Tragically, Isaid remains on committed.  Missed his appointment since March, returning with no meter nor logs.  His A1c is increasing to 13.7% from 10.6%.   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  He remains uncommitted and noncompliant to follow-up and recommendations. He has  Retinopathy, peripheral arterial disease with recurrent diabetic foot ulcers, obesity/sedentary life and he remains at extremely high risk for more acute and chronic complications which include CAD, CVA, CKD, retinopathy, and neuropathy. These are all discussed in detail with him.  - he  admits there is a room for improvement in his diet and drink choices. -  Suggestion is made for him to avoid simple carbohydrates  from his diet including Cakes, Sweet Desserts / Pastries, Ice Cream, Soda (diet and regular), Sweet Tea, Candies, Chips, Cookies, Sweet Pastries,  Store Bought Juices, Alcohol in Excess of  1-2 drinks a day, Artificial  Sweeteners, Coffee Creamer, and "Sugar-free" Products. This will help patient to have stable blood glucose profile and potentially avoid unintended weight gain.   - I encouraged him to switch to  unprocessed or minimally processed complex starch and increased protein intake (animal or plant source), fruits, and vegetables.  - he is advised to stick to a routine mealtimes to eat 3 meals  a day and avoid unnecessary snacks ( to snack only to correct hypoglycemia).   - he has been scheduled with Jearld Fenton, RDN, CDE for individualized diabetes education.  - I have approached him with the following individualized plan to manage diabetes and patient agrees:   -He remains alarmingly noncompliant.  Given his current and prevailing glycemic burden and extremely high risk of complications, he will require intensive treatment with basal/bolus insulin in order for him to achieve control of diabetes to target.   -Unfortunately patient's reluctance and lack of engagement makes it difficult to give him optimal adjustment of treatment.  -He presents with no meter nor logs.  He will benefit from a CGM device.  He is advised to increase his Lantus to 80 units nightly, associated with strict monitoring of blood glucose 4 times a day-daily before meals, and at bedtime and return in 10 days for reevaluation and treatment adjustment.    -He will be considered for NovoLog or Humalog if he commits for proper monitoring and returns with above target postprandial hyperglycemia. - he is encouraged to call clinic for blood glucose levels less than 70 or above 300 mg /dl. -He is not a suitable candidate for metformin, SGLT2 inhibitors, nor incretin therapy. -He is advised to continue glipizide 5 mg XL p.o. daily at breakfast.   -He is urged about the need for strict follow-up until his his insulin regimen is stabilized.   - Patient specific target  A1c;  LDL, HDL, Triglycerides,  and  Waist Circumference were discussed  in detail.  2) BP/HTN: His blood pressure is not controlled to target.  He says he did not take his medications this morning which include hydralazine, amlodipine.  He is advised to go home and take his medications, also  he is advised to continue his  clonidine 0.3 mg patch weekly.  3) Lipids/HPL: Uncontrolled lipid panel with LDL 125.   He cannot commit to me whether he is consistent on his atorvastatin.  He is advised to be consistent and continue atorvastatin 40 mg p.o. nightly. Side effects and precautions discussed with him.  4)  Weight/Diet: His BMI 33 point  - a candidate for modest weight loss.  I discussed with him the fact that loss of 5 - 10% of his  current body weight will have the most impact on his diabetes management.  CDE Consult will be initiated . Exercise, and detailed carbohydrates information provided  -  detailed on discharge instructions.  5) Chronic Care/Health Maintenance:  -he  is not on ACEI/ARB , he is on Statin medications and  is encouraged to initiate and continue to follow up with Ophthalmology, Dentist,  Podiatrist at least yearly or according to recommendations, and advised to  stay away from smoking. I have recommended yearly flu vaccine and pneumonia vaccine at least every 5 years; moderate intensity exercise for up to 150 minutes weekly; and  sleep for at least 7 hours a day.  - I advised patient to maintain close follow up with his PCP  for primary care needs.  - Time spent on this patient care encounter:  45 min, of which > 50% was spent in  counseling and the rest reviewing his blood glucose logs , discussing his hypoglycemia and hyperglycemia episodes, reviewing his current and  previous labs / studies  ( including abstraction from other facilities) and medications  doses and developing a  long term treatment plan and documenting his care.   Please refer to Patient Instructions for Blood Glucose Monitoring and Insulin/Medications Dosing Guide"  in media tab  for additional information. Please  also refer to " Patient Self Inventory" in the Media  tab for reviewed elements of pertinent patient history.  [name] participated in the discussions, expressed understanding, and voiced agreement with the above plans.  All questions were answered to his satisfaction. he is encouraged to contact clinic should he have any questions or concerns prior to his return visit.    Follow up plan: - Return in about 10 days (around 03/24/2020) for F/U with Meter and Logs Only - no Labs.  Glade Lloyd, MD [facility] [address] [place], [place] [address] Phone: [phone]  Fax: [phone]    03/14/2020, 9:01 AM  This note was partially dictated with voice recognition software. Similar sounding words can be transcribed inadequately or may not  be corrected upon review.

## 2020-03-14 NOTE — Patient Instructions (Signed)

## 2020-03-19 ENCOUNTER — Encounter: Payer: BC Managed Care – PPO | Attending: Nurse Practitioner | Admitting: Dietician

## 2020-03-26 ENCOUNTER — Other Ambulatory Visit: Payer: Self-pay

## 2020-03-26 ENCOUNTER — Ambulatory Visit (INDEPENDENT_AMBULATORY_CARE_PROVIDER_SITE_OTHER): Payer: BC Managed Care – PPO | Admitting: Podiatry

## 2020-03-26 DIAGNOSIS — E0843 Diabetes mellitus due to underlying condition with diabetic autonomic (poly)neuropathy: Secondary | ICD-10-CM

## 2020-03-26 DIAGNOSIS — L97512 Non-pressure chronic ulcer of other part of right foot with fat layer exposed: Secondary | ICD-10-CM

## 2020-03-26 NOTE — Progress Notes (Signed)
Subjective: 59 year old male presents the office today for follow-up of ulceration to his right foot.  He states that the wound is getting better.  He has been applying mupirocin ointment.  He also had been soaking in water with Betadine solution.  Denies any drainage or pus or increasing swelling or redness.  He has no other concerns today.   Objective: AAO x3, NAD DP/PT pulses palpable bilaterally, CRT less than 3 seconds Sensation decreased with Semmes Weinstein monofilament On the right foot submetatarsal 1 ulceration today prior to debridement there was a hyperkeratotic lesion with a wound measuring approximately 0.4 x 0.1 cm.  After debridement the wound measured 0.8 x 0.2 x 0.1 cm.  The wound base is granular.  There is hyperkeratotic periwound.  There is no probing, undermining or tunneling.  There is no surrounding erythema, ascending cellulitis peer there is no fluctuation crepitation.  There is no malodor. No other open lesions No pain with calf compression, swelling, warmth, erythema  Assessment: 59 year old male ulceration right foot  Plan: -All treatment options discussed with the patient including all alternatives, risks, complications.  -Sharply debrided the wound utilize #312 with scalpel as well as a tissue nipper down to healthy, bleeding, viable tissue to remove nonviable, devitalized tissue in order to help the wound progress.  We will continue with mupirocin ointment dressing changes daily.  Offloading pads were dispensed.  Recommend to stay out of work at least until next evaluation until the wound has healed.  Monitoring signs or symptoms of infection.  Trula Slade DPM

## 2020-03-27 ENCOUNTER — Encounter: Payer: Self-pay | Admitting: "Endocrinology

## 2020-03-27 ENCOUNTER — Ambulatory Visit (INDEPENDENT_AMBULATORY_CARE_PROVIDER_SITE_OTHER): Payer: BC Managed Care – PPO | Admitting: "Endocrinology

## 2020-03-27 VITALS — BP 148/82 | HR 64 | Ht 69.0 in | Wt 221.8 lb

## 2020-03-27 DIAGNOSIS — E1122 Type 2 diabetes mellitus with diabetic chronic kidney disease: Secondary | ICD-10-CM

## 2020-03-27 DIAGNOSIS — I10 Essential (primary) hypertension: Secondary | ICD-10-CM

## 2020-03-27 DIAGNOSIS — E782 Mixed hyperlipidemia: Secondary | ICD-10-CM | POA: Diagnosis not present

## 2020-03-27 DIAGNOSIS — E1165 Type 2 diabetes mellitus with hyperglycemia: Secondary | ICD-10-CM

## 2020-03-27 DIAGNOSIS — IMO0002 Reserved for concepts with insufficient information to code with codable children: Secondary | ICD-10-CM

## 2020-03-27 DIAGNOSIS — N186 End stage renal disease: Secondary | ICD-10-CM | POA: Diagnosis not present

## 2020-03-27 NOTE — Patient Instructions (Signed)

## 2020-03-27 NOTE — Progress Notes (Signed)
03/27/2020, 10:34 AM                                              Endocrinology follow-up note   Subjective:    Patient ID: Raymond [name], male    DOB: September 21, 1960.  Raymond [name] is being seen in follow-up for  management of his currently uncontrolled type 2 diabetes complicated by end-stage renal disease status post renal transplant, hypertension, hyperlipidemia . -He missed his appointment since March 2021.  He is known to have significant noncompliance to medical recommendations and follow-ups.   Past Medical History:  Diagnosis Date  . Chronic kidney disease    Awaiting transplant  . Diabetes ([place])   . Diabetes mellitus without complication ([place])   . Hypertension   . Renal disorder   . Renal insufficiency    Past Surgical History:  Procedure Laterality Date  . CENTRAL VENOUS CATHETER INSERTION Right 01/20/2020   Procedure: INSERTION CENTRAL LINE ADULT; Tunneled central line;  Surgeon: Virl Cagey, MD;  Location: [facility];  Service: General;  Laterality: Right;  . COLONOSCOPY N/A 12/05/2014   VWP:VXYIAXKP external and internal hemorrhoid/mild diverticulosis/11 polyps removed  . ESOPHAGOGASTRODUODENOSCOPY N/A 12/05/2014   VVZ:SMOL duodenitis  . IR FLUORO GUIDE CV LINE RIGHT  03/01/2019  . IR REMOVAL TUN CV CATH W/O FL  05/10/2019  . IR REMOVAL TUN CV CATH W/O FL  02/29/2020  . IR US GUIDE VASC ACCESS RIGHT  03/01/2019  . KIDNEY TRANSPLANT  03/2015  . TEE WITHOUT CARDIOVERSION N/A 01/17/2020   Procedure: TRANSESOPHAGEAL ECHOCARDIOGRAM (TEE) WITH PROPOFOL;  Surgeon: Arnoldo Lenis, MD;  Location: [facility];  Service: Endoscopy;  Laterality: N/A;   Social History   Socioeconomic History  . Marital status: Married    Spouse name: Not on file  . Number of children: Not on file  . Years of education: Not on file  . Highest education level: Not on file  Occupational History  . Not on file  Tobacco Use  . Smoking status: Never  Smoker  . Smokeless tobacco: Never Used  Vaping Use  . Vaping Use: Never used  Substance and Sexual Activity  . Alcohol use: No    Alcohol/week: 0.0 standard drinks  . Drug use: No  . Sexual activity: Not on file  Other Topics Concern  . Not on file  Social History Narrative   ** Merged History Encounter **       Social Determinants of Health   Financial Resource Strain:   . Difficulty of Paying Living Expenses: Not on file  Food Insecurity:   . Worried About Charity fundraiser in the Last Year: Not on file  . Ran Out of Food in the Last Year: Not on file  Transportation Needs:   . Lack of Transportation (Medical): Not on file  . Lack of Transportation (Non-Medical): Not on file  Physical Activity:   . Days of Exercise per Week: Not on file  . Minutes of Exercise per Session: Not on file  Stress:   . Feeling of Stress : Not on file  Social Connections:   . Frequency of Communication with Friends and Family: Not on file  . Frequency of Social Gatherings with Friends and Family: Not on file  . Attends Religious Services: Not on file  .  Active Member of Clubs or Organizations: Not on file  . Attends Archivist Meetings: Not on file  . Marital Status: Not on file   Outpatient Encounter Medications as of 03/27/2020  Medication Sig  . insulin glargine, 1 Unit Dial, (TOUJEO) 300 UNIT/ML Solostar Pen Inject 80 Units into the skin at bedtime.  [name] amLODipine (NORVASC) 5 MG tablet Take 5 mg by mouth daily.   . Blood Glucose Monitoring Suppl (ACCU-CHEK GUIDE ME) w/Device KIT 1 Piece by Does not apply route as directed.  . cloNIDine (CATAPRES - DOSED IN MG/24 HR) 0.3 mg/24hr patch Place 0.3 mg onto the skin once a week.  . cyclobenzaprine (FLEXERIL) 5 MG tablet Take 2 tablets (10 mg total) by mouth 3 (three) times daily as needed for muscle spasms.  . daptomycin (CUBICIN) IVPB Inject 650 mg into the vein daily. Indication:  osteomyelitis First Dose: No Last Day of Therapy:   02/24/2020 Labs - Once weekly:  CBC/D, BMP, and CPK Labs - Every other week:  ESR and CRP Method of administration: IV Push Method of administration may be changed at the discretion of home infusion pharmacist based upon assessment of the patient and/or caregiver's ability to self-administer the medication ordered.  . doxazosin (CARDURA) 4 MG tablet Take 6 mg by mouth daily.   [name] glipiZIDE (GLUCOTROL XL) 5 MG 24 hr tablet Take 5 mg by mouth daily.   [name] glucose blood (ACCU-CHEK GUIDE) test strip Use as instructed 4 x daily. E11.65  . hydrALAZINE (APRESOLINE) 25 MG tablet Take 25 mg by mouth 2 (two) times daily.  . hydrOXYzine (ATARAX/VISTARIL) 25 MG tablet Take 25 mg by mouth 3 (three) times daily as needed.  . Insulin Pen Needle (B-D ULTRAFINE III SHORT PEN) 31G X 8 MM MISC 1 each by Does not apply route as directed.  . Multiple Vitamin (MULTIVITAMIN) capsule Take 1 capsule by mouth daily.  . mupirocin ointment (BACTROBAN) 2 % Apply 1 application topically 2 (two) times daily.  [name] oxyCODONE-acetaminophen (PERCOCET/ROXICET) 5-325 MG tablet Take 1 tablet by mouth every 4 (four) hours as needed for severe pain.  . sodium bicarbonate 650 MG tablet Take 1,300 mg by mouth 3 (three) times daily.  . tacrolimus (PROGRAF) 1 MG capsule Take 3 mg by mouth 2 (two) times daily. Take 3 capsules (60m) in AM & take 3 caps (362m in PM   . tacrolimus (PROGRAF) 5 MG capsule Take 1 mg by mouth 2 (two) times daily. 3  . triamcinolone (KENALOG) 0.025 % ointment Apply 1 application topically 3 (three) times daily.  . Vitamin D, Ergocalciferol, (DRISDOL) 50000 units CAPS capsule Take 50,000 Units by mouth every 30 (thirty) days.  . [DISCONTINUED] insulin glargine (LANTUS SOLOSTAR) 100 UNIT/ML Solostar Pen Inject 80 Units into the skin at bedtime.   Facility-Administered Encounter Medications as of 03/27/2020  Medication  . sodium chloride flush (NS) 0.9 % injection 3 mL    ALLERGIES: No Known Allergies  VACCINATION  STATUS: Immunization History  Administered Date(s) Administered  . Influenza,inj,Quad PF,6+ Mos 02/22/2019  . Influenza-Unspecified 03/05/2015, 03/09/2016  . PFIZER SARS-COV-2 Vaccination 09/02/2019, 09/30/2019    Diabetes He presents for his follow-up diabetic visit. He has type 2 diabetes mellitus. Onset time: He was diagnosed at approximate age of [age]. His disease course has been worsening (Patient was seen in September 2019, was supposed to return 10 days from his last visit.  He continues to no-show and canceled several appointments.  His recent labs show A1c  still high at 12.4%.). There are no hypoglycemic associated symptoms. Pertinent negatives for hypoglycemia include no confusion, headaches, pallor or seizures. Associated symptoms include blurred vision, foot paresthesias, foot ulcerations, polydipsia and polyuria. Pertinent negatives for diabetes include no chest pain, no fatigue, no polyphagia and no weakness. There are no hypoglycemic complications. Symptoms are worsening. Diabetic complications include nephropathy, PVD and retinopathy. (He had end-stage renal disease as a complication, status post cadaveric kidney transplant in 2016.) Risk factors for coronary artery disease include dyslipidemia, diabetes mellitus, family history, hypertension, male sex, obesity and sedentary lifestyle. Current diabetic treatment includes insulin injections (He is currently on Lantus 60 units nightly, glipizide 5 mg p.o. daily at breakfast.). He is compliant with treatment none of the time. His weight is fluctuating minimally. He is following a generally unhealthy diet. When asked about meal planning, he reported none. He has not had a previous visit with a dietitian. He never participates in exercise. Blood glucose monitoring compliance is inadequate. His home blood glucose trend is fluctuating minimally. Raymond [name], Raymond [name] remains uncommitted.  He presents with inadequate monitoring efforts, still  running significantly above target glycemic profile.  No hypoglycemia documented or reported.  His last documented A1c was 13.7%.    ) An ACE inhibitor/angiotensin II receptor blocker is not being taken.  Hyperlipidemia This is a chronic problem. The current episode started more than 1 year ago. Exacerbating diseases include chronic renal disease, diabetes and obesity. Pertinent negatives include no chest pain, myalgias or shortness of breath. Current antihyperlipidemic treatment includes statins (Is currently on NPH 40 units nightly.). Risk factors for coronary artery disease include diabetes mellitus, dyslipidemia, family history, obesity, male sex, hypertension and a sedentary lifestyle.  Hypertension This is a chronic problem. The current episode started more than 1 year ago. The problem is controlled. Associated symptoms include blurred vision. Pertinent negatives include no chest pain, headaches, neck pain, palpitations or shortness of breath. Risk factors for coronary artery disease include dyslipidemia, diabetes mellitus, male gender, obesity, sedentary lifestyle and family history. Past treatments include central alpha agonists. Hypertensive end-organ damage includes kidney disease, PVD and retinopathy. Patient is status post kidney transplant in 2016.. Identifiable causes of hypertension include chronic renal disease.   ROS: Limited as above.   Objective:    BP (!) 148/82   Pulse 64   Ht _0  (1.753 m)   Wt 221 lb 12.8 oz (100.6 kg)   BMI 32.75 kg/m   Wt Readings from Last 3 Encounters:  03/27/20 221 lb 12.8 oz (100.6 kg)  03/14/20 224 lb 12.8 oz (102 kg)  02/23/20 224 lb (101.6 kg)    CMP Latest Ref Rng & Units 01/20/2020 01/19/2020 01/17/2020  Glucose 70 - 99 mg/dL 163(H) 140(H) 240(H)  BUN 6 - 20 mg/dL 23(H) 22(H) 23(H)  Creatinine 0.61 - 1.24 mg/dL 1.14 1.09 1.05  Sodium 135 - 145 mmol/L 137 136 138  Potassium 3.5 - 5.1 mmol/L 5.1 4.6 4.1  Chloride 98 - 111 mmol/L 110 108  111  CO2 22 - 32 mmol/L 20(L) 22 19(L)  Calcium 8.9 - 10.3 mg/dL 9.2 9.1 8.9  Total Protein 6.5 - 8.1 g/dL - - -  Total Bilirubin 0.3 - 1.2 mg/dL - - -  Alkaline Phos 38 - 126 U/L - - -  AST 15 - 41 U/L - - -  ALT 0 - 44 U/L - - -   Lipid Panel     Component Value Date/Time   CHOL 183 12/12/2019 1224  TRIG 94 12/12/2019 1224   HDL 41 12/12/2019 1224   CHOLHDL 4.5 12/12/2019 1224   LDLCALC 125 (H) 12/12/2019 1224   LABVLDL 17 12/12/2019 1224      Results for GARRITT, Raymond [name] (MRN [id number]) as of 03/14/2020 08:45  Ref. Range 06/14/2018 11:43 07/26/2018 00:00 05/30/2019 00:00 12/12/2019 12:24 01/12/2020 13:28  Hemoglobin A1C Latest Ref Range: 4.8 - 5.6 % >14.0 (H) 12.4 10.6 13.3 (H) 13.7 (H)    Assessment & Plan:   1. 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) ([place]) status post cadaveric renal transplant 2016 - Raymond [name] has chronically uncontrolled, complicated , symptomatic type 2 DM since 59 years of age. Tragically, Raymond [name] remains uncommitted.  He presents with inadequate monitoring efforts, still running significantly above target glycemic profile.  No hypoglycemia documented or reported.  His last documented A1c was 13.7%.   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  He remains uncommitted and noncompliant to follow-up and recommendations. He has  Retinopathy, peripheral arterial disease with recurrent diabetic foot ulcers, obesity/sedentary life and he remains at extremely high risk for more acute and chronic complications which include CAD, CVA, CKD, retinopathy, and neuropathy. These are all discussed in detail with him.  - he  admits there is a room for improvement in his diet and drink choices. -  Suggestion is made for him to avoid simple carbohydrates  from his diet including Cakes, Sweet Desserts / Pastries, Ice Cream, Soda (diet and regular),  Sweet Tea, Candies, Chips, Cookies, Sweet Pastries,  Store Bought Juices, Alcohol in Excess of  1-2 drinks a day, Artificial Sweeteners, Coffee Creamer, and "Sugar-free" Products. This will help patient to have stable blood glucose profile and potentially avoid unintended weight gain.  - I encouraged him to switch to  unprocessed or minimally processed complex starch and increased protein intake (animal or plant source), fruits, and vegetables.  - he is advised to stick to a routine mealtimes to eat 3 meals  a day and avoid unnecessary snacks ( to snack only to correct hypoglycemia).   - he has been scheduled with Raymond [name], RDN, CDE for individualized diabetes education.  - I have approached him with the following individualized plan to manage diabetes and patient agrees:   -He remains uncommitted. Given his current and prevailing glycemic burden and extremely high risk of complications, he will require intensive treatment with basal/bolus insulin in order for him to achieve control of diabetes to target.   -Unfortunately patient's reluctance and lack of engagement makes it difficult to give him optimal adjustment of treatment.  -He presents with inadequate monitoring efforts.   He will benefit from a CGM device.  He reports that his insurance will not pay for Toujeo, advised to continue Toujeo 80 units nightly, resume  strict monitoring of blood glucose 4 times a day-daily before meals, and at bedtime and return in 10-15 days for reevaluation and treatment adjustment.  Until he receives his insulin package, he is given 2 pens of Toujeo sample.   -He will be considered for a CGM device on his next visit.  -He will be considered for NovoLog or Humalog if he commits for proper monitoring and returns with above target postprandial hyperglycemia. - he is encouraged to call clinic for blood glucose levels less than 70 or above 300 mg /dl. -He is not a suitable candidate for metformin, SGLT2  inhibitors, nor incretin  therapy. -He is advised to continue glipizide 5 mg XL p.o. daily at breakfast.   -He is urged about the need for strict follow-up until his his insulin regimen is stabilized.   - Patient specific target  A1c;  LDL, HDL, Triglycerides, and  Waist Circumference were discussed in detail.  2) BP/HTN: His blood pressure is not controlled to target.  He says he did not take his medications this morning which include hydralazine, amlodipine.  He is advised to go home and take his medications, also  he is advised to continue his  clonidine 0.3 mg patch weekly.  3) Lipids/HPL: Uncontrolled lipid panel with LDL 125.   He cannot commit to me whether he is consistent on his atorvastatin.  He is advised to be consistent and continue atorvastatin 40 mg p.o. nightly. Side effects and precautions discussed with him.  4)  Weight/Diet: His BMI 32.75 point  - a candidate for modest weight loss.  I discussed with him the fact that loss of 5 - 10% of his  current body weight will have the most impact on his diabetes management.  CDE Consult will be initiated . Exercise, and detailed carbohydrates information provided  -  detailed on discharge instructions.  5) Chronic Care/Health Maintenance:  -he  is not on ACEI/ARB , he is on Statin medications and  is encouraged to initiate and continue to follow up with Ophthalmology, Dentist,  Podiatrist at least yearly or according to recommendations, and advised to  stay away from smoking. I have recommended yearly flu vaccine and pneumonia vaccine at least every 5 years; moderate intensity exercise for up to 150 minutes weekly; and  sleep for at least 7 hours a day.  - I advised patient to maintain close follow up with his PCP  for primary care needs.  - Time spent on this patient care encounter:  35 min, of which > 50% was spent in  counseling and the rest reviewing his blood glucose logs , discussing his hypoglycemia and hyperglycemia episodes,  reviewing his current and  previous labs / studies  ( including abstraction from other facilities) and medications  doses and developing a  long term treatment plan and documenting his care.   Please refer to Patient Instructions for Blood Glucose Monitoring and Insulin/Medications Dosing Guide"  in media tab for additional information. Please  also refer to " Patient Self Inventory" in the Media  tab for reviewed elements of pertinent patient history.  Raymond [name] participated in the discussions, expressed understanding, and voiced agreement with the above plans.  All questions were answered to his satisfaction. he is encouraged to contact clinic should he have any questions or concerns prior to his return visit.   Follow up plan: - Return in about 2 weeks (around 04/10/2020) for Bring Meter and Logs- A1c in Office.  Glade Lloyd, MD [facility] [address] [place], [place] [address] Phone: [phone]  Fax: [phone]    03/27/2020, 10:34 AM  This note was partially dictated with voice recognition software. Similar sounding words can be transcribed inadequately or may not  be corrected upon review.

## 2020-04-05 ENCOUNTER — Telehealth: Payer: Self-pay | Admitting: Podiatry

## 2020-04-05 ENCOUNTER — Ambulatory Visit (INDEPENDENT_AMBULATORY_CARE_PROVIDER_SITE_OTHER): Payer: BC Managed Care – PPO | Admitting: Podiatry

## 2020-04-05 ENCOUNTER — Other Ambulatory Visit: Payer: Self-pay

## 2020-04-05 ENCOUNTER — Encounter: Payer: Self-pay | Admitting: Podiatry

## 2020-04-05 DIAGNOSIS — L97512 Non-pressure chronic ulcer of other part of right foot with fat layer exposed: Secondary | ICD-10-CM | POA: Diagnosis not present

## 2020-04-05 DIAGNOSIS — E0843 Diabetes mellitus due to underlying condition with diabetic autonomic (poly)neuropathy: Secondary | ICD-10-CM

## 2020-04-05 NOTE — Telephone Encounter (Signed)
Please schedule for the end of the day unless a slot opens.

## 2020-04-05 NOTE — Telephone Encounter (Signed)
Patient wanted to reschedule appointment because he would be late but is expecting to be seen today. I informed patient that I would need to check with you first before he drives in. Patient is being check for wound care and stated if you saw his foot you would see him regardless. I then told patient I would need to check because you don't have any open slots available at the moment, Please advise

## 2020-04-06 ENCOUNTER — Ambulatory Visit: Payer: BC Managed Care – PPO | Admitting: "Endocrinology

## 2020-04-10 NOTE — Progress Notes (Signed)
Subjective: 59 year old male presents the office today for follow-up of ulceration to his right foot. He has good wound is doing well.  He denies any drainage or pus or any increase in swelling or redness to his foot.  He is continue with daily dressing changes.  Also has been soaking his foot and water with diluted Betadine.  He, denies any fevers, chills, nausea, vomiting.  No calf pain, chest pain, shortness of breath.   Objective: AAO x3, NAD DP/PT pulses palpable bilaterally, CRT less than 3 seconds Sensation decreased with Semmes Weinstein monofilament On the right foot submetatarsal 1 with hyperkeratotic tissue with central granular wound.  Picture below is prior to debridement.  After debridement the wound is still present measuring 0.5 x 0.2 x 0.1 cm there is no probing, undermining or tunneling.  There is no surrounding erythema, ascending cellulitis.  There is no fluctuation crepitation.  There is no malodor.  There is no other open lesions identified at this time. No pain with calf compression, swelling, warmth, erythema      Assessment: 59 year old male ulceration right foot-improving  Plan: -All treatment options discussed with the patient including all alternatives, risks, complications.  -Sharply debrided the wound utilize #312 with scalpel as well as a tissue nipper down to healthy, bleeding, viable tissue to remove nonviable, devitalized tissue in order to help the wound progress.  We will continue with the also continue soaking.  Dry thoroughly afterwards.   -Monitor for any clinical signs or symptoms of infection and directed to call the office immediately should any occur or go to the ER.  Trula Slade DPM

## 2020-04-11 ENCOUNTER — Telehealth: Payer: Self-pay

## 2020-04-11 ENCOUNTER — Other Ambulatory Visit: Payer: Self-pay

## 2020-04-11 ENCOUNTER — Ambulatory Visit: Payer: BC Managed Care – PPO | Admitting: "Endocrinology

## 2020-04-11 DIAGNOSIS — E1122 Type 2 diabetes mellitus with diabetic chronic kidney disease: Secondary | ICD-10-CM

## 2020-04-11 DIAGNOSIS — IMO0002 Reserved for concepts with insufficient information to code with codable children: Secondary | ICD-10-CM

## 2020-04-11 MED ORDER — BLOOD GLUCOSE MONITOR KIT: PACK | 0 refills | Status: DC

## 2020-04-11 NOTE — Telephone Encounter (Signed)
Pt is requesting a RX be called in for a new meter. He has a lot of strips for the clever choice, he would like another one of those called in, please. He uses [employer] [facility].

## 2020-04-11 NOTE — Telephone Encounter (Signed)
Sent in

## 2020-04-16 ENCOUNTER — Other Ambulatory Visit: Payer: Self-pay

## 2020-04-16 ENCOUNTER — Ambulatory Visit (INDEPENDENT_AMBULATORY_CARE_PROVIDER_SITE_OTHER): Payer: BC Managed Care – PPO | Admitting: Podiatry

## 2020-04-16 DIAGNOSIS — E0843 Diabetes mellitus due to underlying condition with diabetic autonomic (poly)neuropathy: Secondary | ICD-10-CM | POA: Diagnosis not present

## 2020-04-16 DIAGNOSIS — L97512 Non-pressure chronic ulcer of other part of right foot with fat layer exposed: Secondary | ICD-10-CM

## 2020-04-17 ENCOUNTER — Telehealth: Payer: Self-pay | Admitting: Podiatry

## 2020-04-17 NOTE — Telephone Encounter (Signed)
Good Morning, I was just trying to find out the return to work plan, for Raymond [name]. He was under the impression, he would be returning 11/24, so I just need to update his paperwork. If returning, what restictions. If hes not returning, how long will his out of work be extended.

## 2020-04-17 NOTE — Telephone Encounter (Signed)
Lets put him out until I see him back next appointment then hopefully he can return to work then.

## 2020-04-23 NOTE — Progress Notes (Signed)
Subjective: 59 year old male presents the office today for follow-up of ulceration to his right foot.  He feels that the wound is doing better.  Denies any drainage or pus any swelling or redness to his foot.  He still been changing the bandage daily.  He has no new concerns today. Denies any fevers, chills, nausea, vomiting.  No calf pain, chest pain, shortness of breath.   Objective: AAO x3, NAD DP/PT pulses palpable bilaterally, CRT less than 3 seconds Sensation decreased with Semmes Weinstein monofilament On the right foot submetatarsal 1 with hyperkeratotic tissue with central granular wound.  Upon debridement of the wound appears to be almost healed is smaller today measuring 0.3 x 0.2 x 0.1 cm.  There is no probing, undermining or tunneling.  There is no surrounding erythema, ascending cellulitis.  There is no fluctuation or crepitation.  There is no malodor. No pain with calf compression, swelling, warmth, erythema  Assessment: 59 year old male ulceration right foot-improving  Plan: -All treatment options discussed with the patient including all alternatives, risks, complications.  -Sharply debrided the wound utilize #312 with scalpel as well as a tissue nipper down to healthy, bleeding, viable tissue to remove nonviable, devitalized tissue in order to help the wound progress.  Continue daily dressing changes and offloading.  Still I can remain out of work until the wound is completely healed. -Monitor for any clinical signs or symptoms of infection and directed to call the office immediately should any occur or go to the ER.  Trula Slade DPM

## 2020-04-24 ENCOUNTER — Encounter: Payer: Self-pay | Admitting: "Endocrinology

## 2020-04-24 ENCOUNTER — Other Ambulatory Visit: Payer: Self-pay

## 2020-04-24 ENCOUNTER — Ambulatory Visit (INDEPENDENT_AMBULATORY_CARE_PROVIDER_SITE_OTHER): Payer: BC Managed Care – PPO | Admitting: "Endocrinology

## 2020-04-24 VITALS — BP 162/96 | HR 64 | Ht 69.0 in

## 2020-04-24 DIAGNOSIS — I739 Peripheral vascular disease, unspecified: Secondary | ICD-10-CM | POA: Diagnosis not present

## 2020-04-24 DIAGNOSIS — N186 End stage renal disease: Secondary | ICD-10-CM | POA: Diagnosis not present

## 2020-04-24 DIAGNOSIS — E1122 Type 2 diabetes mellitus with diabetic chronic kidney disease: Secondary | ICD-10-CM

## 2020-04-24 DIAGNOSIS — E782 Mixed hyperlipidemia: Secondary | ICD-10-CM | POA: Diagnosis not present

## 2020-04-24 DIAGNOSIS — I10 Essential (primary) hypertension: Secondary | ICD-10-CM

## 2020-04-24 DIAGNOSIS — E1165 Type 2 diabetes mellitus with hyperglycemia: Secondary | ICD-10-CM

## 2020-04-24 DIAGNOSIS — IMO0002 Reserved for concepts with insufficient information to code with codable children: Secondary | ICD-10-CM

## 2020-04-24 LAB — POCT GLYCOSYLATED HEMOGLOBIN (HGB A1C): Hemoglobin A1C: 11.4 % — AB (ref 4.0–5.6)

## 2020-04-24 MED ORDER — FREESTYLE LIBRE 2 SENSOR MISC: 1.0000 | 3 refills | Status: DC

## 2020-04-24 MED ORDER — FREESTYLE LIBRE 2 READER DEVI: 0 refills | Status: DC

## 2020-04-24 NOTE — Progress Notes (Addendum)
04/24/2020, 11:08 AM                                              Endocrinology follow-up note   Subjective:    Patient ID: Raymond [name], male    DOB: 10-02-1960.  Raymond [name] is being seen in follow-up for  management of his currently uncontrolled type 2 diabetes complicated by end-stage renal disease status post renal transplant, hypertension, hyperlipidemia . -He missed his appointment since March 2021.  He is known to have significant noncompliance to medical recommendations and follow-ups.   Past Medical History:  Diagnosis Date  . Chronic kidney disease    Awaiting transplant  . Diabetes ([place])   . Diabetes mellitus without complication ([place])   . Hypertension   . Renal disorder   . Renal insufficiency    Past Surgical History:  Procedure Laterality Date  . CENTRAL VENOUS CATHETER INSERTION Right 01/20/2020   Procedure: INSERTION CENTRAL LINE ADULT; Tunneled central line;  Surgeon: Virl Cagey, MD;  Location: [facility];  Service: General;  Laterality: Right;  . COLONOSCOPY N/A 12/05/2014   FEO:FHQRFXJO external and internal hemorrhoid/mild diverticulosis/11 polyps removed  . ESOPHAGOGASTRODUODENOSCOPY N/A 12/05/2014   ITG:PQDI duodenitis  . IR FLUORO GUIDE CV LINE RIGHT  03/01/2019  . IR REMOVAL TUN CV CATH W/O FL  05/10/2019  . IR REMOVAL TUN CV CATH W/O FL  02/29/2020  . IR US GUIDE VASC ACCESS RIGHT  03/01/2019  . KIDNEY TRANSPLANT  03/2015  . TEE WITHOUT CARDIOVERSION N/A 01/17/2020   Procedure: TRANSESOPHAGEAL ECHOCARDIOGRAM (TEE) WITH PROPOFOL;  Surgeon: Arnoldo Lenis, MD;  Location: [facility];  Service: Endoscopy;  Laterality: N/A;   Social History   Socioeconomic History  . Marital status: Married    Spouse name: Not on file  . Number of children: Not on file  . Years of education: Not on file  . Highest education level: Not on file  Occupational History  . Not on file  Tobacco Use  . Smoking status: Never  Smoker  . Smokeless tobacco: Never Used  Vaping Use  . Vaping Use: Never used  Substance and Sexual Activity  . Alcohol use: No    Alcohol/week: 0.0 standard drinks  . Drug use: No  . Sexual activity: Not on file  Other Topics Concern  . Not on file  Social History Narrative   ** Merged History Encounter **       Social Determinants of Health   Financial Resource Strain:   . Difficulty of Paying Living Expenses: Not on file  Food Insecurity:   . Worried About Charity fundraiser in the Last Year: Not on file  . Ran Out of Food in the Last Year: Not on file  Transportation Needs:   . Lack of Transportation (Medical): Not on file  . Lack of Transportation (Non-Medical): Not on file  Physical Activity:   . Days of Exercise per Week: Not on file  . Minutes of Exercise per Session: Not on file  Stress:   . Feeling of Stress : Not on file  Social Connections:   . Frequency of Communication with Friends and Family: Not on file  . Frequency of Social Gatherings with Friends and Family: Not on file  . Attends Religious Services: Not on file  .  Active Member of Clubs or Organizations: Not on file  . Attends Archivist Meetings: Not on file  . Marital Status: Not on file   Outpatient Encounter Medications as of 04/24/2020  Medication Sig  . amLODipine (NORVASC) 5 MG tablet Take 5 mg by mouth daily.   . blood glucose meter kit and supplies KIT Dispense based on patient and insurance preference. Use up to four times daily as directed. (FOR ICD-9 250.00, 250.01).  . Blood Glucose Monitoring Suppl (ACCU-CHEK GUIDE ME) w/Device KIT 1 Piece by Does not apply route as directed.  . cloNIDine (CATAPRES - DOSED IN MG/24 HR) 0.3 mg/24hr patch Place 0.3 mg onto the skin once a week.  . Continuous Blood Gluc Receiver (FREESTYLE LIBRE 2 READER) DEVI As directed  . Continuous Blood Gluc Sensor (FREESTYLE LIBRE 2 SENSOR) MISC 1 Piece by Does not apply route every 14 (fourteen) days.  .  cyclobenzaprine (FLEXERIL) 5 MG tablet Take 2 tablets (10 mg total) by mouth 3 (three) times daily as needed for muscle spasms.  . daptomycin (CUBICIN) IVPB Inject 650 mg into the vein daily. Indication:  osteomyelitis First Dose: No Last Day of Therapy:  02/24/2020 Labs - Once weekly:  CBC/D, BMP, and CPK Labs - Every other week:  ESR and CRP Method of administration: IV Push Method of administration may be changed at the discretion of home infusion pharmacist based upon assessment of the patient and/or caregiver's ability to self-administer the medication ordered.  . doxazosin (CARDURA) 4 MG tablet Take 6 mg by mouth daily.   [name] glipiZIDE (GLUCOTROL XL) 5 MG 24 hr tablet Take 5 mg by mouth daily.   [name] glucose blood (ACCU-CHEK GUIDE) test strip Use as instructed 4 x daily. E11.65  . hydrALAZINE (APRESOLINE) 25 MG tablet Take 25 mg by mouth 2 (two) times daily.  . hydrOXYzine (ATARAX/VISTARIL) 25 MG tablet Take 25 mg by mouth 3 (three) times daily as needed.  . insulin glargine, 1 Unit Dial, (TOUJEO) 300 UNIT/ML Solostar Pen Inject 80 Units into the skin at bedtime.  . Insulin Pen Needle (B-D ULTRAFINE III SHORT PEN) 31G X 8 MM MISC 1 each by Does not apply route as directed.  . Multiple Vitamin (MULTIVITAMIN) capsule Take 1 capsule by mouth daily.  . mupirocin ointment (BACTROBAN) 2 % Apply 1 application topically 2 (two) times daily.  [name] oxyCODONE-acetaminophen (PERCOCET/ROXICET) 5-325 MG tablet Take 1 tablet by mouth every 4 (four) hours as needed for severe pain.  . sodium bicarbonate 650 MG tablet Take 1,300 mg by mouth 3 (three) times daily.  . tacrolimus (PROGRAF) 1 MG capsule Take 3 mg by mouth 2 (two) times daily. Take 3 capsules (57m) in AM & take 3 caps (33m in PM   . tacrolimus (PROGRAF) 5 MG capsule Take 1 mg by mouth 2 (two) times daily. 3  . triamcinolone (KENALOG) 0.025 % ointment Apply 1 application topically 3 (three) times daily.  . Vitamin D, Ergocalciferol, (DRISDOL) 50000  units CAPS capsule Take 50,000 Units by mouth every 30 (thirty) days.   Facility-Administered Encounter Medications as of 04/24/2020  Medication  . sodium chloride flush (NS) 0.9 % injection 3 mL    ALLERGIES: No Known Allergies  VACCINATION STATUS: Immunization History  Administered Date(s) Administered  . Influenza,inj,Quad PF,6+ Mos 02/22/2019  . Influenza-Unspecified 03/05/2015, 03/09/2016  . PFIZER SARS-COV-2 Vaccination 09/02/2019, 09/30/2019    Diabetes He presents for his follow-up diabetic visit. He has type 2 diabetes mellitus. Onset time: He was  diagnosed at approximate age of 73 years. His disease course has been worsening (Patient was seen in September 2019, was supposed to return 10 days from his last visit.  He continues to no-show and canceled several appointments.  His recent labs show A1c still high at 12.4%.). There are no hypoglycemic associated symptoms. Pertinent negatives for hypoglycemia include no confusion, headaches, pallor or seizures. Associated symptoms include blurred vision, foot paresthesias, foot ulcerations, polydipsia and polyuria. Pertinent negatives for diabetes include no chest pain, no fatigue, no polyphagia and no weakness. There are no hypoglycemic complications. Symptoms are improving. Diabetic complications include nephropathy, PVD and retinopathy. (He had end-stage renal disease as a complication, status post cadaveric kidney transplant in 2016.) Risk factors for coronary artery disease include dyslipidemia, diabetes mellitus, family history, hypertension, male sex, obesity and sedentary lifestyle. Current diabetic treatment includes insulin injections (He is currently on Lantus 60 units nightly, glipizide 5 mg p.o. daily at breakfast.). He is compliant with treatment none of the time. His weight is fluctuating minimally. He is following a generally unhealthy diet. When asked about meal planning, he reported none. He has not had a previous visit with a  dietitian. He never participates in exercise. Blood glucose monitoring compliance is poor. His home blood glucose trend is fluctuating minimally. His overall blood glucose range is >200 mg/dl. Raymond [name], Raymond [name].  He presents with severely inadequate monitoring attempt, monitoring between 0-1 time a day averaging blood glucose still above 250 mg per DL, point-of-care A1c of 11.4%.  Is only slightly improving from 13.7%.    ) An ACE inhibitor/angiotensin II receptor blocker is not being taken.  Hyperlipidemia This is a chronic problem. The current episode started more than 1 year ago. Exacerbating diseases include chronic renal disease, diabetes and obesity. Pertinent negatives include no chest pain, myalgias or shortness of breath. Current antihyperlipidemic treatment includes statins (Is currently on NPH 40 units nightly.). Risk factors for coronary artery disease include diabetes mellitus, dyslipidemia, family history, obesity, male sex, hypertension and a sedentary lifestyle.  Hypertension This is a chronic problem. The current episode started more than 1 year ago. The problem is controlled. Associated symptoms include blurred vision. Pertinent negatives include no chest pain, headaches, neck pain, palpitations or shortness of breath. Risk factors for coronary artery disease include dyslipidemia, diabetes mellitus, male gender, obesity, sedentary lifestyle and family history. Past treatments include central alpha agonists. Hypertensive end-organ damage includes kidney disease, PVD and retinopathy. Patient is status post kidney transplant in 2016.. Identifiable causes of hypertension include chronic renal disease.   ROS: Limited as above.   Objective:    BP (!) 162/96   Pulse 64   Ht '5\' 9"'  (1.753 m)   BMI 32.75 kg/m   Wt Readings from Last 3 Encounters:  03/27/20 221 lb 12.8 oz (100.6 kg)  03/14/20 224 lb 12.8 oz (102 kg)  02/23/20 224 lb (101.6 kg)    CMP Latest Ref  Rng & Units 01/20/2020 01/19/2020 01/17/2020  Glucose 70 - 99 mg/dL 163(H) 140(H) 240(H)  BUN 6 - 20 mg/dL 23(H) 22(H) 23(H)  Creatinine 0.61 - 1.24 mg/dL 1.14 1.09 1.05  Sodium 135 - 145 mmol/L 137 136 138  Potassium 3.5 - 5.1 mmol/L 5.1 4.6 4.1  Chloride 98 - 111 mmol/L 110 108 111  CO2 22 - 32 mmol/L 20(L) 22 19(L)  Calcium 8.9 - 10.3 mg/dL 9.2 9.1 8.9  Total Protein 6.5 - 8.1 g/dL - - -  Total Bilirubin 0.3 - 1.2  mg/dL - - -  Alkaline Phos 38 - 126 U/L - - -  AST 15 - 41 U/L - - -  ALT 0 - 44 U/L - - -   Lipid Panel     Component Value Date/Time   CHOL 183 12/12/2019 1224   TRIG 94 12/12/2019 1224   HDL 41 12/12/2019 1224   CHOLHDL 4.5 12/12/2019 1224   LDLCALC 125 (H) 12/12/2019 1224   LABVLDL 17 12/12/2019 1224      Results for ATHA, MCBAIN (MRN [id number]) as of 03/14/2020 08:45  Ref. Range 06/14/2018 11:43 07/26/2018 00:00 05/30/2019 00:00 12/12/2019 12:24 01/12/2020 13:28  Hemoglobin A1C Latest Ref Range: 4.8 - 5.6 % >14.0 (H) 12.4 10.6 13.3 (H) 13.7 (H)    Assessment & Plan:   1. 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) ([place]) status post cadaveric renal transplant 2016 - Raymond [name] has chronically uncontrolled, complicated , symptomatic type 2 DM since 59 years of age. Raymond [name].  He presents with severely inadequate monitoring attempt, monitoring between 0-1 time a day averaging blood glucose still above 250 mg per DL, point-of-care A1c of 11.4%.  Is only slightly improving from 13.7%.    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  He remains [name] and noncompliant to follow-up and recommendations. He has  Retinopathy, peripheral arterial disease with recurrent diabetic foot ulcers, obesity/sedentary life and he remains at extremely high risk for more acute and chronic complications which include CAD,  CVA, CKD, retinopathy, and neuropathy. These are all discussed in detail with him.  - he  admits there is a room for improvement in his diet and drink choices. -  Suggestion is made for him to avoid simple carbohydrates  from his diet including Cakes, Sweet Desserts / Pastries, Ice Cream, Soda (diet and regular), Sweet Tea, Candies, Chips, Cookies, Sweet Pastries,  Store Bought Juices, Alcohol in Excess of  1-2 drinks a day, Artificial Sweeteners, Coffee Creamer, and "Sugar-free" Products. This will help patient to have stable blood glucose profile and potentially avoid unintended weight gain.   - I encouraged him to switch to  unprocessed or minimally processed complex starch and increased protein intake (animal or plant source), fruits, and vegetables.  - he is advised to stick to a routine mealtimes to eat 3 meals  a day and avoid unnecessary snacks ( to snack only to correct hypoglycemia).   - he has been scheduled with Raymond [name], Raymond [name], Raymond [name] for individualized diabetes education.  - I have approached him with the following individualized plan to manage diabetes and patient agrees:   -He remains [name]. Given his current and prevailing glycemic burden and extremely high risk of complications, he will require intensive treatment with basal/bolus insulin in order for him to achieve control of diabetes to target.   -Unfortunately patient's reluctance and lack of engagement makes it difficult to give him optimal adjustment of treatment.  -He presents with inadequate monitoring efforts.   He will benefit from a CGM device, I discussed and prescribed the freestyle libre device for him. -In the meantime, he is advised to continue Toujeo 80 units nightly, resume  strict monitoring of blood glucose 4 times a day-daily before meals, and at bedtime and return in 4 weeks for reevaluation and treatment adjustment.   -He will be considered for NovoLog or Humalog if he commits for proper monitoring  and  returns with above target postprandial hyperglycemia. - he is encouraged to call clinic for blood glucose levels less than 70 or above 300 mg /dl. -He is not a suitable candidate for metformin, SGLT2 inhibitors, nor incretin therapy. -He is advised to continue glipizide 5 mg XL p.o. daily at breakfast.   -He is urged about the need for strict follow-up until his his insulin regimen is stabilized.   - Patient specific target  A1c;  LDL, HDL, Triglycerides, and  Waist Circumference were discussed in detail.  2) BP/HTN: His blood pressure is not controlled to target.  He says he did not take his medications this morning which include hydralazine, amlodipine.  He is advised to go home and take his medications, also  he is advised to continue his  clonidine 0.3 mg patch weekly.  3) Lipids/HPL: Uncontrolled lipid panel with LDL 125.   He cannot commit to me whether he is consistent on his atorvastatin.  He is advised to be consistent and continue atorvastatin 40 mg p.o. nightly. Side effects and precautions discussed with him.  4)  Weight/Diet: His BMI 32.75 point  - a candidate for modest weight loss.  I discussed with him the fact that loss of 5 - 10% of his  current body weight will have the most impact on his diabetes management.  Raymond [name] Consult will be initiated . Exercise, and detailed carbohydrates information provided  -  detailed on discharge instructions.  5) Chronic Care/Health Maintenance:  -he  is not on ACEI/ARB , he is on Statin medications and  is encouraged to initiate and continue to follow up with Ophthalmology, Dentist,  Podiatrist at least yearly or according to recommendations, and advised to  stay away from smoking. I have recommended yearly flu vaccine and pneumonia vaccine at least every 5 years; moderate intensity exercise for up to 150 minutes weekly; and  sleep for at least 7 hours a day.  His ABI is abnormal and significant for peripheral arterial disease on bilateral lower  extremities.  Patient was offered referral to vascular surgery, however he states that he already has a vascular surgeon established in [place].  He is going to make a phone call in appointment for better evaluation.  - I advised patient to maintain close follow up with his PCP  for primary care needs.  - Time spent on this patient care encounter:  35 min, of which > 50% was spent in  counseling and the rest reviewing his blood glucose logs , discussing his hypoglycemia and hyperglycemia episodes, reviewing his current and  previous labs / studies  ( including abstraction from other facilities) and medications  doses and developing a  long term treatment plan and documenting his care.   Please refer to Patient Instructions for Blood Glucose Monitoring and Insulin/Medications Dosing Guide"  in media tab for additional information. Please  also refer to " Patient Self Inventory" in the Media  tab for reviewed elements of pertinent patient history.  Raymond [name] participated in the discussions, expressed understanding, and voiced agreement with the above plans.  All questions were answered to his satisfaction. he is encouraged to contact clinic should he have any questions or concerns prior to his return visit.   Follow up plan: - Return in about 4 weeks (around 05/22/2020) for F/U with Meter and Logs Only - no Labs.  Glade Lloyd, MD [facility] [address] [place], [place] [address] Phone: [phone]  Fax: [phone]  04/24/2020, 11:08 AM  This note was partially dictated with voice recognition software. Similar sounding words can be transcribed inadequately or may not  be corrected upon review.

## 2020-04-24 NOTE — Patient Instructions (Signed)

## 2020-04-29 ENCOUNTER — Encounter (HOSPITAL_COMMUNITY): Payer: Self-pay | Admitting: Emergency Medicine

## 2020-04-29 ENCOUNTER — Emergency Department (HOSPITAL_COMMUNITY)
Admission: EM | Admit: 2020-04-29 | Discharge: 2020-04-29 | Payer: BC Managed Care – PPO | Attending: Emergency Medicine | Admitting: Emergency Medicine

## 2020-04-29 ENCOUNTER — Other Ambulatory Visit: Payer: Self-pay

## 2020-04-29 DIAGNOSIS — N186 End stage renal disease: Secondary | ICD-10-CM | POA: Insufficient documentation

## 2020-04-29 DIAGNOSIS — Z940 Kidney transplant status: Secondary | ICD-10-CM | POA: Diagnosis not present

## 2020-04-29 DIAGNOSIS — E1165 Type 2 diabetes mellitus with hyperglycemia: Secondary | ICD-10-CM | POA: Diagnosis not present

## 2020-04-29 DIAGNOSIS — R739 Hyperglycemia, unspecified: Secondary | ICD-10-CM

## 2020-04-29 DIAGNOSIS — Z794 Long term (current) use of insulin: Secondary | ICD-10-CM | POA: Diagnosis not present

## 2020-04-29 DIAGNOSIS — Z79899 Other long term (current) drug therapy: Secondary | ICD-10-CM | POA: Insufficient documentation

## 2020-04-29 DIAGNOSIS — I10 Essential (primary) hypertension: Secondary | ICD-10-CM

## 2020-04-29 DIAGNOSIS — E1122 Type 2 diabetes mellitus with diabetic chronic kidney disease: Secondary | ICD-10-CM | POA: Diagnosis not present

## 2020-04-29 DIAGNOSIS — Z7984 Long term (current) use of oral hypoglycemic drugs: Secondary | ICD-10-CM | POA: Insufficient documentation

## 2020-04-29 DIAGNOSIS — Z760 Encounter for issue of repeat prescription: Secondary | ICD-10-CM | POA: Diagnosis present

## 2020-04-29 DIAGNOSIS — I120 Hypertensive chronic kidney disease with stage 5 chronic kidney disease or end stage renal disease: Secondary | ICD-10-CM | POA: Diagnosis not present

## 2020-04-29 LAB — CBG MONITORING, ED: Glucose-Capillary: 273 mg/dL — ABNORMAL HIGH (ref 70–99)

## 2020-04-29 MED ORDER — CLONIDINE HCL 0.1 MG PO TABS
0.1000 mg | ORAL_TABLET | Freq: Once | ORAL | Status: AC
  Administered 2020-04-29: 0.1 mg via ORAL
  Filled 2020-04-29: qty 1

## 2020-04-29 MED ORDER — INSULIN ASPART 100 UNIT/ML ~~LOC~~ SOLN
5.0000 [IU] | Freq: Once | SUBCUTANEOUS | Status: AC
  Administered 2020-04-29: 5 [IU] via SUBCUTANEOUS
  Filled 2020-04-29: qty 1

## 2020-04-29 NOTE — ED Triage Notes (Signed)
[facility] for the time he is in jail.

## 2020-04-29 NOTE — Discharge Instructions (Addendum)
Have someone bring your medications to where you will be staying and get back on your medication regimen.

## 2020-04-29 NOTE — ED Provider Notes (Signed)
[facility] EMERGENCY DEPARTMENT Provider Note   CSN: [id number] Arrival date & time: 04/29/20  4782   Time seen 4:10 AM  History Chief Complaint  Patient presents with  . Medication Refill    Raymond [name] is a 59 y.o. male.  HPI   [facility].  He was arrested tonight and he is without his medication.  They also states there is no nurse at the jail to okay his medication.  Patient states he has not taken his medication since 1 PM.  They report his CBG was 300 at the jail.  PCP [name], FNP   Past Medical History:  Diagnosis Date  . Chronic kidney disease    Awaiting transplant  . Diabetes ([place])   . Diabetes mellitus without complication ([place])   . Hypertension   . Renal disorder   . Renal insufficiency     Patient Active Problem List   Diagnosis Date Noted  . Peripheral vascular disease ([place]) 04/24/2020  . 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 Thrombophlebitis of superficial veins of right lower extremity   . Difficult intravenous access   . MRSA bacteremia   . Diabetic foot infection ([place])   . Infected wound 01/12/2020  . Critical lower limb ischemia ([place]) 02/02/2019  . Personal history of noncompliance with medical treatment, presenting hazards to health 10/07/2018  . Critical limb ischemia with history of revascularization of same extremity ([place]) 07/27/2018  . Mixed hyperlipidemia 02/19/2018  . Class 1 obesity due to excess calories with serious comorbidity and body mass index (BMI) of 31.0 to 31.9 in adult 02/19/2018  . History of renal transplant 02/19/2018  . Bilateral impacted cerumen 08/31/2017  . Conductive hearing loss, bilateral 08/31/2017  . Nuclear sclerotic cataract of right eye 10/07/2016  . Pseudophakia of left eye 10/07/2016  . Hypertension due to endocrine disorder 03/04/2016  . 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  Renal failure 03/04/2016  . Acute kidney injury ([place]) 12/10/2015  . Immunosuppression ([place]) 12/10/2015  . Nausea vomiting and diarrhea 12/10/2015  . 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) ([place]) 12/10/2015  . Essential hypertension, benign 12/10/2015  . ARF (acute renal failure) ([place]) 12/10/2015  . Pancytopenia ([place]) 12/10/2015  . Encounter for screening colonoscopy 11/15/2014  . GERD (gastroesophageal reflux disease) 11/15/2014    Past Surgical History:  Procedure Laterality Date  . CENTRAL VENOUS CATHETER INSERTION Right 01/20/2020   Procedure: INSERTION CENTRAL LINE ADULT; Tunneled central line;  Surgeon: Virl Cagey, MD;  Location: [facility] ORS;  Service: General;  Laterality: Right;  . COLONOSCOPY N/A 12/05/2014   NFA:OZHYQMVH external and internal hemorrhoid/mild diverticulosis/11 polyps removed  . ESOPHAGOGASTRODUODENOSCOPY N/A 12/05/2014   QIO:NGEX duodenitis  . IR FLUORO GUIDE CV LINE RIGHT  03/01/2019  . IR REMOVAL TUN CV CATH W/O FL  05/10/2019  . IR REMOVAL TUN CV CATH W/O FL  02/29/2020  . IR US GUIDE VASC ACCESS RIGHT  03/01/2019  . KIDNEY TRANSPLANT  03/2015  . TEE WITHOUT CARDIOVERSION N/A 01/17/2020   Procedure: TRANSESOPHAGEAL ECHOCARDIOGRAM (TEE) WITH PROPOFOL;  Surgeon: Arnoldo Lenis, MD;  Location: [facility] ENDO SUITE;  Service: Endoscopy;  Laterality: N/A;       Family History  Problem Relation Age of Onset  . Diabetes Mother   . Heart disease Father   . Colon cancer Neg Hx     Social History  Tobacco Use  . Smoking status: Never Smoker  . Smokeless tobacco: Never Used  Vaping Use  . Vaping Use: Never used  Substance Use Topics  . Alcohol use: No    Alcohol/week: 0.0 standard drinks  . Drug use: No    Home Medications Prior to Admission medications   Medication Sig Start Date End Date Taking? Authorizing Provider  amLODipine (NORVASC) 5 MG tablet Take 5 mg by mouth daily.     [provider]  blood  glucose meter kit and supplies KIT Dispense based on patient and insurance preference. Use up to four times daily as directed. (FOR ICD-9 250.00, 250.01). 04/11/20   [name], MD  Blood Glucose Monitoring Suppl (ACCU-CHEK GUIDE ME) w/Device KIT 1 Piece by Does not apply route as directed. 02/18/18   [name], MD  cloNIDine (CATAPRES - DOSED IN MG/24 HR) 0.3 mg/24hr patch Place 0.3 mg onto the skin once a week.    [provider]  Continuous Blood Gluc Receiver (FREESTYLE LIBRE 2 READER) DEVI As directed 04/24/20   [name], MD  Continuous Blood Gluc Sensor (FREESTYLE LIBRE 2 SENSOR) MISC 1 Piece by Does not apply route every 14 (fourteen) days. 04/24/20   [name], MD  cyclobenzaprine (FLEXERIL) 5 MG tablet Take 2 tablets (10 mg total) by mouth 3 (three) times daily as needed for muscle spasms. 01/20/20   Orson Eva, MD  daptomycin (CUBICIN) IVPB Inject 650 mg into the vein daily. Indication:  osteomyelitis First Dose: No Last Day of Therapy:  02/24/2020 Labs - Once weekly:  CBC/D, BMP, and CPK Labs - Every other week:  ESR and CRP Method of administration: IV Push Method of administration may be changed at the discretion of home infusion pharmacist based upon assessment of the patient and/or caregiver's ability to self-administer the medication ordered. 01/20/20   Orson Eva, MD  doxazosin (CARDURA) 4 MG tablet Take 6 mg by mouth daily.  12/18/15   [provider]  glipiZIDE (GLUCOTROL XL) 5 MG 24 hr tablet Take 5 mg by mouth daily.     [provider]  glucose blood (ACCU-CHEK GUIDE) test strip Use as instructed 4 x daily. E11.65 02/19/18   [name], MD  hydrALAZINE (APRESOLINE) 25 MG tablet Take 25 mg by mouth 2 (two) times daily.    [provider]  hydrOXYzine (ATARAX/VISTARIL) 25 MG tablet Take 25 mg by mouth 3 (three) times daily as needed. 01/07/20   [provider]  insulin glargine, 1  Unit Dial, (TOUJEO) 300 UNIT/ML Solostar Pen Inject 80 Units into the skin at bedtime.    [provider]  Insulin Pen Needle (B-D ULTRAFINE III SHORT PEN) 31G X 8 MM MISC 1 each by Does not apply route as directed. 02/18/18   [name], MD  Multiple Vitamin (MULTIVITAMIN) capsule Take 1 capsule by mouth daily.    [provider]  mupirocin ointment (BACTROBAN) 2 % Apply 1 application topically 2 (two) times daily. 03/09/20   McDonald, Stephan Minister, DPM  oxyCODONE-acetaminophen (PERCOCET/ROXICET) 5-325 MG tablet Take 1 tablet by mouth every 4 (four) hours as needed for severe pain. 01/20/20   Orson Eva, MD  sodium bicarbonate 650 MG tablet Take 1,300 mg by mouth 3 (three) times daily. 11/02/18   [provider]  tacrolimus (PROGRAF) 1 MG capsule Take 3 mg by mouth 2 (two) times daily. Take 3 capsules (20m) in AM & take 3 caps (337m in PM  [provider]  tacrolimus (PROGRAF) 5 MG capsule Take 1 mg by mouth 2 (two) times daily. 3 09/27/19   [provider]  triamcinolone (KENALOG) 0.025 % ointment Apply 1 application topically 3 (three) times daily. 01/07/20   [provider]  Vitamin D, Ergocalciferol, (DRISDOL) 50000 units CAPS capsule Take 50,000 Units by mouth every 30 (thirty) days.    [provider]  Patient states he takes amlodipine 5 mg twice a day, he has a clonidine patch 0.3 mg per 24 hours that he is without because he had taken a shower, Lantus insulin 80 units at bedtime and in the morning he has a sliding scale which he cannot tell me what it is.  He states he takes Prograf 5 mg twice a day.  Allergies    Patient has no known allergies.  Review of Systems   Review of Systems  All other systems reviewed and are negative.   Physical Exam Updated Vital Signs BP (!) 197/110   Pulse 72   Temp 97.9 F (36.6 C) (Oral)   Resp 17   Ht _0  (1.753 m)   Wt 102.5 kg   SpO2 99%   BMI 33.37 kg/m   Physical  Exam Vitals and nursing note reviewed.  Constitutional:      General: He is not in acute distress.    Appearance: Normal appearance. He is obese.  HENT:     Head: Normocephalic and atraumatic.  Eyes:     Extraocular Movements: Extraocular movements intact.     Conjunctiva/sclera: Conjunctivae normal.  Cardiovascular:     Rate and Rhythm: Normal rate.  Pulmonary:     Effort: Pulmonary effort is normal. No respiratory distress.  Musculoskeletal:     Cervical back: Normal range of motion.  Neurological:     General: No focal deficit present.     Mental Status: He is alert and oriented to person, place, and time.     Cranial Nerves: No cranial nerve deficit.  Psychiatric:        Mood and Affect: Mood normal.        Behavior: Behavior normal.        Thought Content: Thought content normal.     ED Results / Procedures / Treatments   Labs (all labs ordered are listed, but only abnormal results are displayed) Results for orders placed or performed during the hospital encounter of 04/29/20  CBG monitoring, ED  Result Value Ref Range   Glucose-Capillary 273 (H) 70 - 99 mg/dL   Laboratory interpretation all normal      Results for orders placed or performed in visit on 04/24/20  HgB A1c  Result Value Ref Range   Hemoglobin A1C 11.4 (A) 4.0 - 5.6 %   HbA1c POC (<> result, manual entry)     HbA1c, POC (prediabetic range)     HbA1c, POC (controlled diabetic range)     Poorly controlled diabetes   EKG None  Radiology No results found.  Procedures Procedures (including critical care time)  Medications Ordered in ED Medications  cloNIDine (CATAPRES) tablet 0.1 mg (has no administration in time range)  insulin aspart (novoLOG) injection 5 Units (has no administration in time range)    ED Course  I have reviewed the triage vital signs and the nursing notes.  Pertinent labs & imaging results that were available during my care of the patient were reviewed by me and  considered in my medical decision making (see chart for details).  MDM Rules/Calculators/A&P                           As I am talking to the patient trying to figure out which medications he takes the supervisor from the jail called and they are going to release him on thaw to go somewhere other than his home.  He states he will be able to get his medication.  Patient was given 5 units of regular insulin subcu and clonidine 0.1 mg orally.  He normally wears the patch which he will be able to get later this morning.   Final Clinical Impression(s) / ED Diagnoses Final diagnoses:  Hyperglycemia  Primary hypertension    Rx / DC Orders ED Discharge Orders    None     Plan discharge  Rolland Porter, MD, Barbette Or, MD 04/29/20 [phone]

## 2020-05-01 ENCOUNTER — Encounter: Payer: Self-pay | Admitting: Podiatry

## 2020-05-01 ENCOUNTER — Ambulatory Visit (INDEPENDENT_AMBULATORY_CARE_PROVIDER_SITE_OTHER): Payer: BC Managed Care – PPO | Admitting: Podiatry

## 2020-05-01 ENCOUNTER — Other Ambulatory Visit: Payer: Self-pay

## 2020-05-01 DIAGNOSIS — L97512 Non-pressure chronic ulcer of other part of right foot with fat layer exposed: Secondary | ICD-10-CM | POA: Diagnosis not present

## 2020-05-01 DIAGNOSIS — E0843 Diabetes mellitus due to underlying condition with diabetic autonomic (poly)neuropathy: Secondary | ICD-10-CM | POA: Diagnosis not present

## 2020-05-03 ENCOUNTER — Telehealth: Payer: Self-pay | Admitting: Podiatry

## 2020-05-03 NOTE — Telephone Encounter (Signed)
Good Morning  Raymond [name] would like to return to work 05/07/2020, with no restrictions. I need his notes to send over with [employer] Duty Certification, please. Thank you

## 2020-05-07 NOTE — Progress Notes (Signed)
Subjective: 59 year old male presents the office today for follow-up of ulceration to his right foot. He states the wound is healing well.  He has not seen any drainage or pus or any swelling or redness.  Has no other concerns today.  Currently denies any fevers, chills, nausea, vomiting.  No calf pain, chest pain, shortness of breath.  Objective: AAO x3, NAD DP/PT pulses palpable bilaterally, CRT less than 3 seconds Sensation decreased with Semmes Weinstein monofilament On the right foot submetatarsal 1 with hyperkeratotic tissue with central granular wound.  The wound is almost completely healed at this time.  Some is an abrasion type lesion.  There is no probing, undermining or tunneling.  There is no surrounding erythema, ascending cellulitis.  No fluctuance or crepitation.  No malodor. No pain with calf compression, swelling, warmth, erythema  Assessment: 59 year old male ulceration right foot-improving  Plan: -All treatment options discussed with the patient including all alternatives, risks, complications.  -Sharply debrided the wound utilize #312 with scalpel as well as a tissue nipper down to healthy, bleeding, viable tissue to remove nonviable, devitalized tissue in order to promote wound healing.  Continue daily dressing changes and offloading.  Still I can remain out of work until the wound is completely healed. -Continue offloading.  At this time we will release him to go back to work as well. -Monitor for any clinical signs or symptoms of infection and directed to call the office immediately should any occur or go to the ER.  Trula Slade DPM

## 2020-05-07 NOTE — Telephone Encounter (Signed)
done

## 2020-05-08 ENCOUNTER — Telehealth: Payer: Self-pay

## 2020-05-08 NOTE — Telephone Encounter (Signed)
-----   Message from [name], [place] sent at 05/07/2020  5:36 PM EST ----- Be sure he has an appt for follow up ER visit and follow up for his diabetes, see if he is seeing an endocrinologist?   ----- Message ----- From: Trula Slade, DPM Sent: 05/07/2020   7:53 AM EST To: [name], FNP

## 2020-05-08 NOTE — Telephone Encounter (Signed)
Mailbox is full unable to leave VM

## 2020-05-14 ENCOUNTER — Ambulatory Visit: Payer: BC Managed Care – PPO | Admitting: Podiatry

## 2020-05-15 ENCOUNTER — Ambulatory Visit: Payer: BC Managed Care – PPO | Admitting: Podiatry

## 2020-05-22 ENCOUNTER — Telehealth: Payer: Self-pay

## 2020-05-22 ENCOUNTER — Other Ambulatory Visit: Payer: Self-pay

## 2020-05-22 DIAGNOSIS — IMO0002 Reserved for concepts with insufficient information to code with codable children: Secondary | ICD-10-CM

## 2020-05-22 MED ORDER — INSULIN GLARGINE (1 UNIT DIAL) 300 UNIT/ML ~~LOC~~ SOPN: 80.0000 [IU] | PEN_INJECTOR | Freq: Every day | SUBCUTANEOUS | 1 refills | Status: DC

## 2020-05-22 NOTE — Telephone Encounter (Signed)
Sent in

## 2020-05-22 NOTE — Telephone Encounter (Signed)
Patient said he needs a refill on his toujeo and send to [facility]. thanks

## 2020-05-23 ENCOUNTER — Ambulatory Visit: Payer: BC Managed Care – PPO | Admitting: "Endocrinology

## 2020-05-24 ENCOUNTER — Ambulatory Visit (INDEPENDENT_AMBULATORY_CARE_PROVIDER_SITE_OTHER): Payer: BC Managed Care – PPO | Admitting: Podiatry

## 2020-05-24 ENCOUNTER — Other Ambulatory Visit: Payer: Self-pay

## 2020-05-24 DIAGNOSIS — L97512 Non-pressure chronic ulcer of other part of right foot with fat layer exposed: Secondary | ICD-10-CM

## 2020-05-24 DIAGNOSIS — I739 Peripheral vascular disease, unspecified: Secondary | ICD-10-CM

## 2020-05-24 DIAGNOSIS — E0843 Diabetes mellitus due to underlying condition with diabetic autonomic (poly)neuropathy: Secondary | ICD-10-CM | POA: Diagnosis not present

## 2020-05-24 NOTE — Progress Notes (Signed)
Subjective: 59 year old male presents the office today for follow-up of ulceration to his right foot. Since I last saw him he has returned to work.  He has noticed callus buildup along the wound.  Denies any drainage or pus or any swelling. Denies any fevers, chills, nausea, vomiting.  No calf pain, chest pain, shortness of breath.  Objective: AAO x3, NAD DP/PT pulses palpable bilaterally, CRT less than 3 seconds Sensation decreased with Semmes Weinstein monofilament On the right foot submetatarsal 1 with hyperkeratotic tissue with central granular wound.  After debridement of the wound today measures 0.6 x 0.4 x 0.2 cm without any probing, undermining or tunneling.  Hyperkeratotic periwound.  No surrounding erythema, ascending cellulitis.  There is no fluctuation or crepitation.  There is no malodor. No pain with calf compression, swelling, warmth, erythema  Assessment: 59 year old male ulceration right foot  Plan: -All treatment options discussed with the patient including all alternatives, risks, complications.  -Sharply debrided the wound utilize #312 with scalpel as well as a tissue nipper down to healthy, bleeding, viable tissue to remove nonviable, devitalized tissue in order to promote wound healing.  Continue daily dressing changes and offloading.  Still I can remain out of work until the wound is completely healed. -Order arterial studies to check circulation -Continue offloading.  -Monitor for any clinical signs or symptoms of infection and directed to call the office immediately should any occur or go to the ER.  RTC 2 weeks  Trula Slade DPM

## 2020-05-29 ENCOUNTER — Ambulatory Visit: Payer: BC Managed Care – PPO | Admitting: "Endocrinology

## 2020-05-29 ENCOUNTER — Other Ambulatory Visit (HOSPITAL_COMMUNITY): Payer: Self-pay | Admitting: Podiatry

## 2020-05-29 DIAGNOSIS — I739 Peripheral vascular disease, unspecified: Secondary | ICD-10-CM

## 2020-06-04 ENCOUNTER — Other Ambulatory Visit: Payer: Self-pay

## 2020-06-04 ENCOUNTER — Ambulatory Visit (INDEPENDENT_AMBULATORY_CARE_PROVIDER_SITE_OTHER): Payer: BC Managed Care – PPO | Admitting: Podiatry

## 2020-06-04 DIAGNOSIS — E0843 Diabetes mellitus due to underlying condition with diabetic autonomic (poly)neuropathy: Secondary | ICD-10-CM

## 2020-06-04 DIAGNOSIS — L97512 Non-pressure chronic ulcer of other part of right foot with fat layer exposed: Secondary | ICD-10-CM

## 2020-06-06 NOTE — Progress Notes (Signed)
Subjective: 60 year old male presents the office today for follow-up of ulceration to his right foot.  He is still back and working on his feet more.  Denies any drainage or pus any swelling or redness.  He has no pain to the area. Denies any fevers, chills, nausea, vomiting.  No calf pain, chest pain, shortness of breath.  Objective: AAO x3, NAD DP/PT pulses palpable bilaterally, CRT less than 3 seconds Sensation decreased with Semmes Weinstein monofilament On the right foot submetatarsal 1 with hyperkeratotic tissue with central granular wound.  After debridement of the wound today measures 0.5 x 0.3 x 0.2 cm without any probing, undermining or tunneling.  Hyperkeratotic periwound.  No surrounding erythema, ascending cellulitis.  There is no fluctuation or crepitation.  There is no malodor. No pain with calf compression, swelling, warmth, erythema  Assessment: 60 year old male, ulceration right foot  Plan: -All treatment options discussed with the patient including all alternatives, risks, complications.  -Sharply debrided the wound utilize #312 with scalpel as well as a tissue nipper down to healthy, bleeding, viable tissue to remove nonviable, devitalized tissue in order to promote wound healing.  Continue daily dressing changes and offloading.   -Continue offloading. -Arterial studies scheduled for January 14.  Return in about 2 weeks (around 06/18/2020).  Trula Slade DPM

## 2020-06-08 ENCOUNTER — Inpatient Hospital Stay (HOSPITAL_COMMUNITY): Admission: RE | Admit: 2020-06-08 | Payer: BC Managed Care – PPO | Source: Ambulatory Visit

## 2020-06-14 ENCOUNTER — Ambulatory Visit: Payer: BC Managed Care – PPO | Admitting: Podiatry

## 2020-06-14 ENCOUNTER — Ambulatory Visit (HOSPITAL_COMMUNITY)
Admission: RE | Admit: 2020-06-14 | Discharge: 2020-06-14 | Disposition: A | Discharge status: Home / Self Care | Payer: BC Managed Care – PPO | Source: Ambulatory Visit | Attending: Cardiology | Admitting: Cardiology

## 2020-06-14 ENCOUNTER — Other Ambulatory Visit: Payer: Self-pay

## 2020-06-14 DIAGNOSIS — L97512 Non-pressure chronic ulcer of other part of right foot with fat layer exposed: Secondary | ICD-10-CM | POA: Diagnosis not present

## 2020-06-14 DIAGNOSIS — I739 Peripheral vascular disease, unspecified: Secondary | ICD-10-CM | POA: Diagnosis not present

## 2020-06-19 ENCOUNTER — Other Ambulatory Visit: Payer: Self-pay

## 2020-06-19 ENCOUNTER — Ambulatory Visit (INDEPENDENT_AMBULATORY_CARE_PROVIDER_SITE_OTHER): Payer: BC Managed Care – PPO | Admitting: Podiatry

## 2020-06-19 DIAGNOSIS — L97512 Non-pressure chronic ulcer of other part of right foot with fat layer exposed: Secondary | ICD-10-CM | POA: Diagnosis not present

## 2020-06-19 DIAGNOSIS — E0843 Diabetes mellitus due to underlying condition with diabetic autonomic (poly)neuropathy: Secondary | ICD-10-CM

## 2020-06-19 DIAGNOSIS — I739 Peripheral vascular disease, unspecified: Secondary | ICD-10-CM

## 2020-06-19 MED ORDER — SILVER SULFADIAZINE 1 % EX CREA: 1.0000 "application " | TOPICAL_CREAM | Freq: Every day | CUTANEOUS | 0 refills | Status: DC

## 2020-06-19 NOTE — Progress Notes (Signed)
Subjective: 60 year old male presents the office today for follow-up of ulceration to his right foot.  He has been continue with Iodosorb dressing changes of the wound daily.  Denies any drainage or pus any swelling or redness.  Patient was previous admitted to [facility] for infection of his right foot.  The wound has been healing although slowly.  He did have arterial studies performed which presents today for review as well.  Last A1c 11.4  Objective: AAO x3, NAD DP/PT pulses palpable bilaterally, CRT less than 3 seconds Sensation decreased with Semmes Weinstein monofilament On the right foot submetatarsal 1 with hyperkeratotic tissue with central granular wound.  After debridement of the wound today measures 0.6 x 0.5 x 0.2 cm without any probing, undermining or tunneling.  There is more hyperkeratotic periwound tissue today.  There is no surrounding erythema, ascending cellulitis there is no drainage or pus or any signs of infection noted today.   No pain with calf compression, swelling, warmth, erythema  Assessment: 60 year old male, ulceration right foot; PAD; uncontrolled diabetes   Plan: -All treatment options discussed with the patient including all alternatives, risks, complications.  -Sharply debrided the wound utilize #312 with scalpel as well as a tissue nipper down to healthy, bleeding, viable tissue to remove nonviable, devitalized tissue in order to promote wound healing.  Continue daily dressing changes and offloading.   -The wound is healing although slowly.  Arterial studies are normal.  Referral to further evaluate circulation. -Glucose control  -Continue offloading.  Return in about 2 weeks Trula Slade DPM

## 2020-06-25 NOTE — Progress Notes (Deleted)
Cardiology Clinic Note   Patient Name: Raymond [name] Date of Encounter: 06/25/2020  Primary Care Provider:  Minette [name], [place] Primary Cardiologist:  Raymond Burow, MD  Patient Profile    Raymond [name] 60 year old male presents the clinic today for follow-up evaluation of his peripheral arterial disease.  Past Medical History    Past Medical History:  Diagnosis Date  . Chronic kidney disease    Awaiting transplant  . Diabetes ([place])   . Diabetes mellitus without complication ([place])   . Hypertension   . Renal disorder   . Renal insufficiency    Past Surgical History:  Procedure Laterality Date  . CENTRAL VENOUS CATHETER INSERTION Right 01/20/2020   Procedure: INSERTION CENTRAL LINE ADULT; Tunneled central line;  Surgeon: Virl Cagey, MD;  Location: [facility];  Service: General;  Laterality: Right;  . COLONOSCOPY N/A 12/05/2014   BZJ:IRCVELFY external and internal hemorrhoid/mild diverticulosis/11 polyps removed  . ESOPHAGOGASTRODUODENOSCOPY N/A 12/05/2014   BOF:BPZW duodenitis  . IR FLUORO GUIDE CV LINE RIGHT  03/01/2019  . IR REMOVAL TUN CV CATH W/O FL  05/10/2019  . IR REMOVAL TUN CV CATH W/O FL  02/29/2020  . IR US GUIDE VASC ACCESS RIGHT  03/01/2019  . KIDNEY TRANSPLANT  03/2015  . TEE WITHOUT CARDIOVERSION N/A 01/17/2020   Procedure: TRANSESOPHAGEAL ECHOCARDIOGRAM (TEE) WITH PROPOFOL;  Surgeon: Arnoldo Lenis, MD;  Location: [facility];  Service: Endoscopy;  Laterality: N/A;    Allergies  No Known Allergies  History of Present Illness    Raymond [name] has a PMH of peripheral arterial disease who was initially referred by Dr. Earleen [name] for evaluation of his lower extremities. He was noted to have a small ulcer on the plantar surface of his right first metatarsal. His PMH also includes hypertension, diabetes, and hyperlipidemia. He reported that his father had an MI at age 39 and her brother had a had an MI as well. He underwent renal transplant in [facility]  03/09/2015. He was on hemodialysis 6 years prior to his transplant. He reported that he had stepped on a nail 2-3 months prior and developed a wound on his plantar surface of his right first metatarsal. He has been receiving treatment frequently from his PCP over the prior several months. He reported that it slowly healed. He had Dopplers which were performed at [facility] that showed normal circulation. A new wound was found on his left foot and recent Doppler showed left TBI 0.28, monophasic waveforms in his left SFA with tibial vessel disease, occluded anterior tibial and peroneal artery. It was felt that he would need amputation of the toe due to documentation of osteomyelitis. It was felt that the healing process would be difficult due to his lower extremity Dopplers. There was also concern for angiography due to radiocontrast being nephrotoxic and jeopardizing his transplanted kidney.  He followed up with his PCP who also thought it was unlikely 3 infected area on his left foot would heal. His PCP spoke with nephrology echoing concerned about exposure to radiocontrast in the setting of renal insufficiency. Nephrology felt comfortable proceeding with angiography and intervention.  He was last seen by Dr. Gwenlyn Found on 04/01/2019. During that time he had developed a new wound on his left second toe which looked to be gangrenous. He denied pain. He was scheduled for angiography and potential endovascular procedure for limb salvage.  He presented to the [facility] emergency department on 01/12/2020.  He was sent to the emergency department for evaluation of his  worsening foot wound.  It was noted that he had purulent and a foul odor coming from a wound on the plantar region of his right foot.  His blood cultures showed MRSA bacteremia. He underwent TEE 01/17/2020 in the setting of MRSA bacteremia.  No evidence of vegetation was noted.  It was planned that he would be transferred to [facility] for further  evaluation and treatment.  However transfer was not possible due to lack of bed availability.  Infectious disease reviewed his case, blood cultures continued to be negative.  A PICC line was placed for antibiotics.  He was seen by Dr. Jacqualyn Posey 01/24/2020.  He reported that he was feeling much better since being discharged from the hospital.  His wound was debrided and his antibiotics were continued.  He followed up with podiatry again on 02/07/2020.  His foot foot was doing much better at that time with no signs of abscess or infection.  His wound was again debrided without complication.  He was seen by podiatry on 02/16/2020, his wound was again debrided and continued to heal well.  His dressing changes were continued.  He presented for evaluation again on 03/26/2020 by podiatry.  His wound continued to heal it was again debrided and he was instructed to stay out of work until his wound could be evaluated again.  He was taken to the emergency department 04/29/2020.  He presented from jail.  He had been arrested and was without his medication.  There was no nurse at the jail to review his medication.  His blood sugar at that time was 300.  He received 5 units of regular insulin, clonidine, and was instructed to review his medications with the supervisor from jail.  He was again seen by podiatry 06/19/2020.  During that time there is one measuring 0.6 x 0.5 x 0.2 cm.  His wound continued to heal.  It was again debrided, he was asked to continue dressing changes and offloading.  He was informed that his wound continues to heal slowly.  His arterial studies were normal.  He was referred for further evaluation of his circulation. He presents the clinic today for follow-up evaluation states***  *** denies chest pain, shortness of breath, lower extremity edema, fatigue, palpitations, melena, hematuria, hemoptysis, diaphoresis, weakness, presyncope, syncope, orthopnea, and PND.   Home Medications    Prior to Admission  medications   Medication Sig Start Date End Date Taking? Authorizing Provider  amLODipine (NORVASC) 5 MG tablet Take 5 mg by mouth daily.     [provider]  blood glucose meter kit and supplies KIT Dispense based on patient and insurance preference. Use up to four times daily as directed. (FOR ICD-9 250.00, 250.01). 04/11/20   [name], MD  Blood Glucose Monitoring Suppl (ACCU-CHEK GUIDE ME) w/Device KIT 1 Piece by Does not apply route as directed. 02/18/18   [name], MD  cloNIDine (CATAPRES - DOSED IN MG/24 HR) 0.3 mg/24hr patch Place 0.3 mg onto the skin once a week.    [provider]  Continuous Blood Gluc Receiver (FREESTYLE LIBRE 2 READER) DEVI As directed 04/24/20   [name], MD  Continuous Blood Gluc Sensor (FREESTYLE LIBRE 2 SENSOR) MISC 1 Piece by Does not apply route every 14 (fourteen) days. 04/24/20   [name], MD  cyclobenzaprine (FLEXERIL) 5 MG tablet Take 2 tablets (10 mg total) by mouth 3 (three) times daily as needed for muscle spasms. 01/20/20   Orson Eva,  MD  daptomycin (CUBICIN) IVPB Inject 650 mg into the vein daily. Indication:  osteomyelitis First Dose: No Last Day of Therapy:  02/24/2020 Labs - Once weekly:  CBC/D, BMP, and CPK Labs - Every other week:  ESR and CRP Method of administration: IV Push Method of administration may be changed at the discretion of home infusion pharmacist based upon assessment of the patient and/or caregiver's ability to self-administer the medication ordered. 01/20/20   Orson Eva, MD  doxazosin (CARDURA) 4 MG tablet Take 6 mg by mouth daily.  12/18/15   [provider]  glipiZIDE (GLUCOTROL XL) 5 MG 24 hr tablet Take 5 mg by mouth daily.     [provider]  glucose blood (ACCU-CHEK GUIDE) test strip Use as instructed 4 x daily. E11.65 02/19/18   [name], MD  hydrALAZINE (APRESOLINE) 25 MG tablet Take 25 mg by mouth 2 (two) times daily.     [provider]  hydrOXYzine (ATARAX/VISTARIL) 25 MG tablet Take 25 mg by mouth 3 (three) times daily as needed. 01/07/20   [provider]  insulin glargine, 1 Unit Dial, (TOUJEO) 300 UNIT/ML Solostar Pen Inject 80 Units into the skin at bedtime. 05/22/20   [name], MD  Insulin Pen Needle (B-D ULTRAFINE III SHORT PEN) 31G X 8 MM MISC 1 each by Does not apply route as directed. 02/18/18   [name], MD  Multiple Vitamin (MULTIVITAMIN) capsule Take 1 capsule by mouth daily.    [provider]  mupirocin ointment (BACTROBAN) 2 % Apply 1 application topically 2 (two) times daily. 03/09/20   McDonald, Stephan Minister, DPM  oxyCODONE-acetaminophen (PERCOCET/ROXICET) 5-325 MG tablet Take 1 tablet by mouth every 4 (four) hours as needed for severe pain. 01/20/20   Orson Eva, MD  silver sulfADIAZINE (SILVADENE) 1 % cream Apply 1 application topically daily. 06/19/20   Trula Slade, DPM  sodium bicarbonate 650 MG tablet Take 1,300 mg by mouth 3 (three) times daily. 11/02/18   [provider]  tacrolimus (PROGRAF) 1 MG capsule Take 3 mg by mouth 2 (two) times daily. Take 3 capsules (62m) in AM & take 3 caps (344m in PM     [provider]  tacrolimus (PROGRAF) 5 MG capsule Take 1 mg by mouth 2 (two) times daily. 3 09/27/19   [provider]  triamcinolone (KENALOG) 0.025 % ointment Apply 1 application topically 3 (three) times daily. 01/07/20   [provider]  Vitamin D, Ergocalciferol, (DRISDOL) 50000 units CAPS capsule Take 50,000 Units by mouth every 30 (thirty) days.    [provider]    Family History    Family History  Problem Relation Age of Onset  . Diabetes Mother   . Heart disease Father   . Colon cancer Neg Hx    He indicated that his mother is deceased. He indicated that his father is deceased. He indicated that the status of his neg hx is unknown.  Social History    Social History   Socioeconomic  History  . Marital status: Married    Spouse name: Not on file  . Number of children: Not on file  . Years of education: Not on file  . Highest education level: Not on file  Occupational History  . Not on file  Tobacco Use  . Smoking status: Never Smoker  . Smokeless tobacco: Never Used  Vaping Use  . Vaping Use: Never used  Substance and Sexual Activity  . Alcohol use:  No    Alcohol/week: 0.0 standard drinks  . Drug use: No  . Sexual activity: Not on file  Other Topics Concern  . Not on file  Social History Narrative   ** Merged History Encounter **       Social Determinants of Health   Financial Resource Strain: Not on file  Food Insecurity: Not on file  Transportation Needs: Not on file  Physical Activity: Not on file  Stress: Not on file  Social Connections: Not on file  Intimate Partner Violence: Not on file     Review of Systems    General:  No chills, fever, night sweats or weight changes.  Cardiovascular:  No chest pain, dyspnea on exertion, edema, orthopnea, palpitations, paroxysmal nocturnal dyspnea. Dermatological: No rash, lesions/masses Respiratory: No cough, dyspnea Urologic: No hematuria, dysuria Abdominal:   No nausea, vomiting, diarrhea, bright red blood per rectum, melena, or hematemesis Neurologic:  No visual changes, wkns, changes in mental status. All other systems reviewed and are otherwise negative except as noted above.  Physical Exam    VS:  There were no vitals taken for this visit. , BMI There is no height or weight on file to calculate BMI. GEN: Well nourished, well developed, in no acute distress. HEENT: normal. Neck: Supple, no JVD, carotid bruits, or masses. Cardiac: RRR, no murmurs, rubs, or gallops. No clubbing, cyanosis, edema.  Radials/DP/PT 2+ and equal bilaterally.  Respiratory:  Respirations regular and unlabored, clear to auscultation bilaterally. GI: Soft, nontender, nondistended, BS + x 4. MS: no deformity or  atrophy. Skin: warm and dry, no rash. Neuro:  Strength and sensation are intact. Psych: Normal affect.  Accessory Clinical Findings    Recent Labs: 01/13/2020: ALT 17 01/20/2020: BUN 23; Creatinine, Ser 1.14; Hemoglobin 12.5; Platelets 195; Potassium 5.1; Sodium 137   Recent Lipid Panel    Component Value Date/Time   CHOL 183 12/12/2019 1224   TRIG 94 12/12/2019 1224   HDL 41 12/12/2019 1224   CHOLHDL 4.5 12/12/2019 1224   LDLCALC 125 (H) 12/12/2019 1224    ECG personally reviewed by me today- *** - No acute changes  TEE 01/17/2020 IMPRESSIONS    1. Left ventricular ejection fraction, by estimation, is 55 to 60%. The  left ventricle has normal function. The left ventricle has no regional  wall motion abnormalities.  2. Right ventricular systolic function is normal. The right ventricular  size is normal.  3. Left atrial size was mildly dilated. No left atrial/left atrial  appendage thrombus was detected. The LAA emptying velocity was 90 cm/s.  4. Right atrial size was mildly dilated.  5. The mitral valve is normal in structure. Trivial mitral valve  regurgitation. No evidence of mitral stenosis.  6. The aortic valve is tricuspid. Aortic valve regurgitation is not  visualized. No aortic stenosis is present.   Conclusion(s)/Recommendation(s): No evidence of vegetation/infective  endocarditis on this transesophageal  echocardiogram.   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 The right toe-brachial index is abnormal.   Left: Resting left ankle-brachial index indicates noncompressible left  lower extremity arteries. The left toe-brachial index is abnormal.  Assessment & Plan   1.  Lower limb ischemia-no new lower extremity/foot wounds noted.  Wound on  right plantar aspect of his foot continues to heal well.  ABIs/TBI's 06/14/2020 unchanged from prior  study. Repeat lower extremity arterial  Essential hypertension-BP today***.  Well-controlled at home. Continue amlodipine, clonidine,  doxazosin, hydralazine Heart healthy low-sodium diet-salty 6 given Increase physical activity as tolerated Follows with PCP  Type 2 diabetes-hemoglobin A1c 11.4 on 04/24/2020.  Encouraged close with blood sugar monitoring. Continue glipizide, insulin Heart healthy low-sodium carbohydrate modified diet Increase physical activity as tolerated Follows with PCP   Disposition: Follow-up with Dr. Gwenlyn Found after testing.   Jossie Ng. Gwenn Teodoro NP-C    06/25/2020, 1:27 PM [facility] [place] [address] Office [phone] Fax [phone]  Notice: This dictation was prepared with Dragon dictation along with smaller phrase technology. Any transcriptional errors that result from this process are unintentional and may not be corrected upon review.  I spent***minutes examining this patient, reviewing medications, and using patient centered shared decision making involving her cardiac care.  Prior to her visit I spent greater than 20 minutes reviewing her past medical history,  medications, and prior cardiac tests.

## 2020-06-26 ENCOUNTER — Ambulatory Visit: Payer: BC Managed Care – PPO | Admitting: General Practice

## 2020-07-03 ENCOUNTER — Ambulatory Visit: Payer: BC Managed Care – PPO | Admitting: Podiatry

## 2020-07-05 ENCOUNTER — Other Ambulatory Visit: Payer: Self-pay

## 2020-07-05 ENCOUNTER — Encounter: Payer: Self-pay | Admitting: Podiatry

## 2020-07-05 ENCOUNTER — Ambulatory Visit (INDEPENDENT_AMBULATORY_CARE_PROVIDER_SITE_OTHER): Payer: BC Managed Care – PPO | Admitting: Podiatry

## 2020-07-05 DIAGNOSIS — E0843 Diabetes mellitus due to underlying condition with diabetic autonomic (poly)neuropathy: Secondary | ICD-10-CM | POA: Diagnosis not present

## 2020-07-05 DIAGNOSIS — I739 Peripheral vascular disease, unspecified: Secondary | ICD-10-CM | POA: Diagnosis not present

## 2020-07-05 DIAGNOSIS — L97512 Non-pressure chronic ulcer of other part of right foot with fat layer exposed: Secondary | ICD-10-CM

## 2020-07-09 ENCOUNTER — Ambulatory Visit: Payer: BC Managed Care – PPO | Admitting: Podiatry

## 2020-07-09 NOTE — Progress Notes (Signed)
Subjective: 60 year old male presents the office today for follow-up of ulceration to his right foot.  He is asking if he should come out of work again to the wound to heal.  Denies any drainage or pus any swelling or redness.  He just states that the wound is taking longer to heal.  I previously had ordered referral to check his circulation but appointment not yet made.   Last A1c 11.4 on 04/24/2021  Objective: AAO x3, NAD DP/PT pulses palpable bilaterally, CRT less than 3 seconds Sensation decreased with Semmes Weinstein monofilament On the right foot submetatarsal 1 with hyperkeratotic tissue and there is some Betadine present around the wound.  Upon debridement of hyperkeratotic tissue related wound is still evident measuring approximately 0.5 x 0.2 cm which about the same compared to last appointment.  There is no probing, undermining or tunneling.  There is no surrounding erythema, ascending cellulitis.  There is no fluctuation, crepitation, malodor.  AFTER DEBRIDEMENT:   Before Debridement:     Assessment: 60 year old male, ulceration right foot; PAD; uncontrolled diabetes   Plan: -All treatment options discussed with the patient including all alternatives, risks, complications.  -Sharply debrided the wound utilize #312 with scalpel as well as a tissue nipper down to healthy, bleeding, viable tissue to remove nonviable, devitalized tissue in order to promote wound healing.  Continue daily dressing changes and offloading.   -The wound is healing although slowly.  Arterial studies are ABNORMAL.  Referral previously made to Dr. Quay Burow. Will follow up on this.  -Glucose control  -Continue offloading.  Return in about 2 weeks  Trula Slade DPM

## 2020-07-16 ENCOUNTER — Encounter: Payer: Self-pay | Admitting: General Practice

## 2020-07-18 ENCOUNTER — Telehealth: Payer: Self-pay

## 2020-07-18 NOTE — Telephone Encounter (Signed)
Dr Kennon Holter office has tried multiple times to schedule patient. Patient was no show to appt after multiple attempts were made to get him scheduled, expunge letter sent, referral closed  Do you want to put in a new referral?

## 2020-07-18 NOTE — Telephone Encounter (Signed)
He comes in on the 28th. We will talk to him about it.

## 2020-07-19 ENCOUNTER — Ambulatory Visit: Payer: BC Managed Care – PPO | Admitting: Podiatry

## 2020-07-23 ENCOUNTER — Ambulatory Visit: Payer: BC Managed Care – PPO | Admitting: Podiatry

## 2020-07-26 ENCOUNTER — Ambulatory Visit (INDEPENDENT_AMBULATORY_CARE_PROVIDER_SITE_OTHER): Payer: BC Managed Care – PPO | Admitting: Podiatry

## 2020-07-26 ENCOUNTER — Other Ambulatory Visit: Payer: Self-pay

## 2020-07-26 ENCOUNTER — Ambulatory Visit: Payer: BC Managed Care – PPO | Admitting: Podiatry

## 2020-07-26 ENCOUNTER — Encounter: Payer: Self-pay | Admitting: Podiatry

## 2020-07-26 DIAGNOSIS — I739 Peripheral vascular disease, unspecified: Secondary | ICD-10-CM

## 2020-07-26 DIAGNOSIS — L97512 Non-pressure chronic ulcer of other part of right foot with fat layer exposed: Secondary | ICD-10-CM | POA: Diagnosis not present

## 2020-07-30 ENCOUNTER — Telehealth: Payer: Self-pay | Admitting: Podiatry

## 2020-07-30 MED ORDER — DOXYCYCLINE HYCLATE 100 MG PO TABS: 100.0000 mg | ORAL_TABLET | Freq: Two times a day (BID) | ORAL | 0 refills | Status: DC

## 2020-07-30 NOTE — Telephone Encounter (Signed)
Sent doxycyline

## 2020-07-30 NOTE — Telephone Encounter (Signed)
Patient called and lcm a vm stating that an antibiotic was suppose to be called into his pharmacy and he would like to follow up on it

## 2020-07-30 NOTE — Telephone Encounter (Signed)
Informed patient

## 2020-07-31 NOTE — Progress Notes (Signed)
Subjective: 60 year old male presents the office today for follow-up of ulceration to his right foot. Received a call that he had been rechecked to several times for follow-up for circulation but were unable to reach him.  He denies any drainage or pus coming from the wound denies any swelling or redness.  Denies any fevers, chills, nausea, vomiting.  No calf pain, chest pain, shortness of breath.  Last A1c 11.4 on 04/24/2021  Objective: AAO x3, NAD DP/PT pulses palpable bilaterally, CRT less than 3 seconds Sensation decreased with Semmes Weinstein monofilament On the right foot submetatarsal 1 with hyperkeratotic tissue.  Upon debridement the wound is doing slightly better at 0.4 x 0.2 cm without any probing, undermining or tunneling.  No surrounding erythema, ascending cellulitis.  No fluctuation crepitation peer there is no malodor.   Assessment: 60 year old male, ulceration right foot; PAD; uncontrolled diabetes   Plan: -All treatment options discussed with the patient including all alternatives, risks, complications.  -Sharply debrided the wound utilize #312 with scalpel as well as a tissue nipper down to healthy, bleeding, viable tissue to remove nonviable, devitalized tissue in order to promote wound healing.  Continue daily dressing changes and offloading.   -The wound is healing although slowly.  Arterial studies are ABNORMAL encouraged him to follow-up with Dr. Alvester Chou recent contact the office.  Will follow up on this.  -Glucose control  -Continue offloading.  Return in about 2 weeks  Trula Slade DPM

## 2020-08-07 ENCOUNTER — Telehealth: Payer: Self-pay | Admitting: Podiatry

## 2020-08-07 ENCOUNTER — Telehealth: Payer: Self-pay

## 2020-08-07 NOTE — Telephone Encounter (Signed)
Can you please call him back? We received a message previously that several attempts were made to reach him but was unsuccessful.

## 2020-08-07 NOTE — Telephone Encounter (Signed)
Pt called stating he never heard back from [facility]. Please advise.

## 2020-08-07 NOTE — Telephone Encounter (Signed)
Attempted to call patient to give him the office number for Dr. Quay Burow, which is [phone]. However, the patient did not answer and I was unable to leave a voicemail message.

## 2020-08-09 ENCOUNTER — Telehealth: Payer: Self-pay

## 2020-08-09 NOTE — Telephone Encounter (Signed)
He needs to schedule an office visit, let me know if he does not schedule    I left pt vm to call the office I have called both numbers on file [facility]

## 2020-08-13 ENCOUNTER — Telehealth: Payer: Self-pay

## 2020-08-13 NOTE — Telephone Encounter (Signed)
Patient returned my call from Friday I did call him back and left him another vm to call the office so we can schedule an appt [facility]

## 2020-08-16 ENCOUNTER — Ambulatory Visit (INDEPENDENT_AMBULATORY_CARE_PROVIDER_SITE_OTHER): Payer: BC Managed Care – PPO | Admitting: Podiatry

## 2020-08-16 ENCOUNTER — Encounter: Payer: Self-pay | Admitting: Podiatry

## 2020-08-16 ENCOUNTER — Other Ambulatory Visit: Payer: Self-pay

## 2020-08-16 DIAGNOSIS — L97512 Non-pressure chronic ulcer of other part of right foot with fat layer exposed: Secondary | ICD-10-CM | POA: Diagnosis not present

## 2020-08-16 DIAGNOSIS — I739 Peripheral vascular disease, unspecified: Secondary | ICD-10-CM

## 2020-08-19 NOTE — Progress Notes (Signed)
Subjective: 60 year old male presents the office today for follow-up of ulceration to his right foot.  He had previously been referred to Dr. Alvester Chou to evaluate his circulation however they attempted multiple times to contact him unable to reach him.  He then contacted the office and we could not contact him.  Therefore has not been seen to evaluate his circulation.  From a wound standpoint he states that the wound is needs to be trimmed.  Denies any swelling or redness or any drainage.  No increase in swelling or redness to his feet. Denies any fevers, chills, nausea, vomiting.  No calf pain, chest pain, shortness of breath.  Last A1c 11.4 on 04/24/2021  Objective: AAO x3, NAD- wife present  DP/PT pulses palpable bilaterally, CRT less than 3 seconds Sensation decreased with Semmes Weinstein monofilament On the right foot submetatarsal 1 with hyperkeratotic tissue.  There is increased hyperkeratotic tissue to the wound.  After debridement of the wound it did measure 0.6 x 0.2 cm without any probing to bone, or tunneling.  Has a depth of 0.2 cm with a granular wound base.  There is no surrounding erythema, ascending cellulitis.  No fluctuation or crepitation.  There is no malodor.  No pain with calf compression, erythema or warmth.   Assessment: 60 year old male, ulceration right foot; PAD; uncontrolled diabetes   Plan: -All treatment options discussed with the patient including all alternatives, risks, complications.  -Sharply debrided the wound utilize #312 with scalpel as well as a tissue nipper down to healthy, bleeding, viable tissue to remove nonviable, devitalized tissue in order to promote wound healing.  Continue daily dressing changes and offloading.   -The wound is healing although slowly.  Arterial studies are ABNORMAL encouraged him to follow-up with Dr. Alvester Chou recent contact the office.  I gave him the number to contact the office so he can do this himself.  Numerous attempts of been  made for this appointment.  -Glucose control  -Continue offloading.  Trula Slade DPM

## 2020-08-21 ENCOUNTER — Telehealth: Payer: Self-pay | Admitting: Podiatry

## 2020-08-21 NOTE — Telephone Encounter (Signed)
I believe there was a letter written saying he could work part time

## 2020-08-21 NOTE — Telephone Encounter (Signed)
I was wondering if you continued out of work for patient. I tried to call him, but wasent successful.

## 2020-08-30 ENCOUNTER — Ambulatory Visit: Payer: BC Managed Care – PPO | Admitting: Podiatry

## 2020-09-10 ENCOUNTER — Ambulatory Visit (INDEPENDENT_AMBULATORY_CARE_PROVIDER_SITE_OTHER): Payer: BC Managed Care – PPO | Admitting: Podiatry

## 2020-09-10 ENCOUNTER — Other Ambulatory Visit: Payer: Self-pay

## 2020-09-10 DIAGNOSIS — L97512 Non-pressure chronic ulcer of other part of right foot with fat layer exposed: Secondary | ICD-10-CM

## 2020-09-10 DIAGNOSIS — I739 Peripheral vascular disease, unspecified: Secondary | ICD-10-CM | POA: Diagnosis not present

## 2020-09-12 NOTE — Progress Notes (Signed)
Subjective: 60 year old male presents the office today for follow-up of ulceration to his right foot.  He states he needs to have the wound "trimmed".  Denies any drainage or pus or any swelling or redness.  I previously had made in the referral for him for his circulation however they try to contact him on several occasions and he did not respond.  I also given him the number previously to call he has not yet followed up with Dr. Gwenlyn Found yet. Denies any fevers, chills, nausea, vomiting.  No calf pain, chest pain, shortness of breath.  Last A1c 11.4 on 04/24/2021  Objective: AAO x3, NAD- wife present  DP/PT pulses palpable bilaterally, CRT less than 3 seconds Sensation decreased with Semmes Weinstein monofilament On the right foot submetatarsal 1 with hyperkeratotic tissue.  There is increased hyperkeratotic tissue to the wound.  After debridement of the wound it did measure 0.7 x 0.2 x 0.2 cm without any probing to bone, or tunneling.  Overall the wound is about the same in size.  There is no surrounding erythema, ascending cellulitis.  No fluctuation or crepitation.  There is no malodor.  No pain with calf compression, erythema or warmth.   Assessment: 60 year old male, ulceration right foot; PAD; uncontrolled diabetes   Plan: -All treatment options discussed with the patient including all alternatives, risks, complications.  -Sharply debrided the wound utilize #312 with scalpel as well as a tissue nipper down to healthy, bleeding, viable tissue to remove nonviable, devitalized tissue in order to promote wound healing.  Continue daily dressing changes and offloading.   -The wound is healing although slowly.  Arterial studies are ABNORMAL encouraged him to follow-up with Dr. Alvester Chou recent contact the office.  I have also put a new referral in for him if needed.  I gave him the number to contact the office so he can do this himself.  Numerous attempts of been made for this appointment.  -Glucose  control  -Continue offloading.  Trula Slade DPM

## 2020-09-25 ENCOUNTER — Other Ambulatory Visit: Payer: Self-pay | Admitting: "Endocrinology

## 2020-09-25 DIAGNOSIS — IMO0002 Reserved for concepts with insufficient information to code with codable children: Secondary | ICD-10-CM

## 2020-09-25 DIAGNOSIS — E1122 Type 2 diabetes mellitus with diabetic chronic kidney disease: Secondary | ICD-10-CM

## 2020-09-28 ENCOUNTER — Ambulatory Visit (INDEPENDENT_AMBULATORY_CARE_PROVIDER_SITE_OTHER): Payer: BC Managed Care – PPO | Admitting: Cardiovascular Disease

## 2020-09-28 ENCOUNTER — Encounter: Payer: Self-pay | Admitting: Cardiovascular Disease

## 2020-09-28 ENCOUNTER — Other Ambulatory Visit: Payer: Self-pay

## 2020-09-28 VITALS — BP 136/62 | HR 59 | Ht 70.5 in | Wt 217.0 lb

## 2020-09-28 DIAGNOSIS — I10 Essential (primary) hypertension: Secondary | ICD-10-CM | POA: Diagnosis not present

## 2020-09-28 DIAGNOSIS — I70229 Atherosclerosis of native arteries of extremities with rest pain, unspecified extremity: Secondary | ICD-10-CM

## 2020-09-28 DIAGNOSIS — E782 Mixed hyperlipidemia: Secondary | ICD-10-CM

## 2020-09-28 MED ORDER — ATORVASTATIN CALCIUM 10 MG PO TABS: 10.0000 mg | ORAL_TABLET | Freq: Every day | ORAL | 3 refills | Status: DC

## 2020-09-28 NOTE — Assessment & Plan Note (Signed)
History of a slowly healing ulcer at the base of his first metatarsal on the right.  His last Doppler studies performed 01/27/2019 showed widely patent vessels down to the foot.  His foot is warm and he is got a 2+ dorsalis pedis pulse.  The wound is slowly healing.  Its not inflamed or draining.  I do not think any further vascular work-up is necessary at this time.

## 2020-09-28 NOTE — Assessment & Plan Note (Signed)
History of essential hypertension blood pressure measured today 136/62.  He is on amlodipine and hydralazine.

## 2020-09-28 NOTE — Patient Instructions (Signed)
Medication Instructions:   -Start atorvastatin (lipitor) 10mg  once daily.  *If you need a refill on your cardiac medications before your next appointment, please call your pharmacy*   Lab Work: Your physician recommends that you return for lab work in: 3 months to check fasting lipid/liver profile.  If you have labs (blood work) drawn today and your tests are completely normal, you will receive your results only by: [name] MyChart Message (if you have MyChart) OR . A paper copy in the mail If you have any lab test that is abnormal or we need to change your treatment, we will call you to review the results.   Follow-Up: At [facility], you and your health needs are our priority.  As part of our continuing mission to provide you with exceptional heart care, we have created designated Provider Care Teams.  These Care Teams include your primary Cardiologist (physician) and Advanced Practice Providers (APPs -  Physician Assistants and Nurse Practitioners) who all work together to provide you with the care you need, when you need it.  We recommend signing up for the patient portal called "MyChart".  Sign up information is provided on this After Visit Summary.  MyChart is used to connect with patients for Virtual Visits (Telemedicine).  Patients are able to view lab/test results, encounter notes, upcoming appointments, etc.  Non-urgent messages can be sent to your provider as well.   To learn more about what you can do with MyChart, go to [email or web address].    Your next appointment:   12 month(s)  The format for your next appointment:   In Person  Provider:   Quay Burow, MD

## 2020-09-28 NOTE — Progress Notes (Signed)
09/28/2020 Raymond [name]   08-14-1960  [id number]  Primary Physician [name], FNP Primary Cardiologist: Lorretta Harp MD FACP, [place], [place], [place]  HPI:  Raymond [name] is a 60 y.o.  moderately overweight separated African-American male father of 2 children, grandfather of 2 grandchildren whoI last saw in the office11/10/2018. He is accompanied by his wife Raymond [name]. he i was referred by Dr. Jacqualyn [name], his podiatrist for peripheral vascular valuation because of a small ulcer on the plantar surface of his right first metatarsal. He has a history of treated hypertension, diabetes and hyperlipidemia. His father had an MI at age 69 and his brother recently had a myocardial infarction as well. He is never had a heart attack or stroke. He denies chest pain or shortness of breath. 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 He apparently stepped on a nail approximately 2 to 3 months ago and developed a wound on the plantar surface of his right first metatarsal which Dr. Jacqualyn [name] has been treating fairly frequently over the last several months. Apparently this is slowly healing. He had Dopplers performed in [place] recently that were told showed normal circulation.Since I saw him 6 months ago the wound on his right foot is healed. He is developed a new wound on his left foot with recent Dopplers that showed a left TBI 0.28, monophasic waveforms in his left SFA with tibial vessel disease, occluded anterior tibial and peroneal artery. I suspect he will need a amputation of that toe since he has documented osteomyelitis according to Dr. Jacqualyn [name]. Is unclear whether this will heal but I suspect based on his Dopplers that this will be difficult. I am also concerned that angiography will lead to radiocontrast nephropathy and jeopardize his transplanted kidney.  He saw Dr.Wagoner who  felt that his wound was unlikely to heal and that he would require amputation. Dr. Jacqualyn [name] spoke to the patient's nephrologist echoing my concern about exposure to radiocontrast in the setting of moderate renal insufficiency status post renal artery transplant and apparently the nephrologist felt comfortable proceeding with peripheral angiography potential intervention.  Since I saw him a year and a half ago he continues to do well.  He does have a slowly healing wound at the head of his first metatarsal on the right.  His last Doppler studies performed in our office 01/27/2019 showed no obstructive disease on the right side.  He has a 2+ right dorsalis pedis pulse.  He denies chest pain or shortness of breath.   Current Meds  Medication Sig  . amLODipine (NORVASC) 5 MG tablet Take 5 mg by mouth daily.   . blood glucose meter kit and supplies KIT Dispense based on patient and insurance preference. Use up to four times daily as directed. (FOR ICD-9 250.00, 250.01).  . Blood Glucose Monitoring Suppl (ACCU-CHEK GUIDE ME) w/Device KIT 1 Piece by Does not apply route as directed.  . cloNIDine (CATAPRES - DOSED IN MG/24 HR) 0.3 mg/24hr patch Place 0.3 mg onto the skin once a week.  . Continuous Blood Gluc Receiver (FREESTYLE LIBRE 2 READER) DEVI As directed  . Continuous Blood Gluc Sensor (FREESTYLE LIBRE 2 SENSOR) MISC 1 Piece by Does not apply route every 14 (fourteen) days.  . cyclobenzaprine (FLEXERIL) 5 MG tablet Take 2 tablets (10 mg total) by mouth 3 (three) times daily as needed for muscle spasms.  . daptomycin (  CUBICIN) IVPB Inject 650 mg into the vein daily. Indication:  osteomyelitis First Dose: No Last Day of Therapy:  02/24/2020 Labs - Once weekly:  CBC/D, BMP, and CPK Labs - Every other week:  ESR and CRP Method of administration: IV Push Method of administration may be changed at the discretion of home infusion pharmacist based upon assessment of the patient and/or caregiver's ability to  self-administer the medication ordered.  . dorzolamide-timolol (COSOPT) 22.3-6.8 MG/ML ophthalmic solution Apply to eye.  [name] doxazosin (CARDURA) 4 MG tablet Take 6 mg by mouth daily.   [name] doxycycline (VIBRA-TABS) 100 MG tablet Take 1 tablet (100 mg total) by mouth 2 (two) times daily.  [name] glipiZIDE (GLUCOTROL XL) 5 MG 24 hr tablet Take 5 mg by mouth daily.   [name] glucose blood (ACCU-CHEK GUIDE) test strip Use as instructed 4 x daily. E11.65  . hydrALAZINE (APRESOLINE) 25 MG tablet Take 25 mg by mouth 2 (two) times daily.  . hydrOXYzine (ATARAX/VISTARIL) 25 MG tablet Take 25 mg by mouth 3 (three) times daily as needed.  . insulin glargine, 1 Unit Dial, (TOUJEO) 300 UNIT/ML Solostar Pen Inject 80 Units into the skin at bedtime.  . Insulin Pen Needle (B-D ULTRAFINE III SHORT PEN) 31G X 8 MM MISC 1 each by Does not apply route as directed.  . Multiple Vitamin (MULTIVITAMIN) capsule Take 1 capsule by mouth daily.  . mupirocin ointment (BACTROBAN) 2 % Apply 1 application topically 2 (two) times daily.  [name] oxyCODONE-acetaminophen (PERCOCET/ROXICET) 5-325 MG tablet Take 1 tablet by mouth every 4 (four) hours as needed for severe pain.  . silver sulfADIAZINE (SILVADENE) 1 % cream Apply 1 application topically daily.  . sodium bicarbonate 650 MG tablet Take 1,300 mg by mouth 3 (three) times daily.  . tacrolimus (PROGRAF) 1 MG capsule Take 4 mg by mouth 2 (two) times daily. Take 3 capsules (40m) in AM & take 3 caps (348m in PM  . triamcinolone (KENALOG) 0.025 % ointment Apply 1 application topically 3 (three) times daily.  . Vitamin D, Ergocalciferol, (DRISDOL) 50000 units CAPS capsule Take 50,000 Units by mouth every 30 (thirty) days.  . [DISCONTINUED] tacrolimus (PROGRAF) 5 MG capsule Take 1 mg by mouth 2 (two) times daily. 3   Current Facility-Administered Medications for the 09/28/20 encounter (Office Visit) with BeLorretta HarpMD  Medication  . sodium chloride flush (NS) 0.9 % injection 3 mL     No  Known Allergies  Social History   Socioeconomic History  . Marital status: Married    Spouse name: Not on file  . Number of children: Not on file  . Years of education: Not on file  . Highest education level: Not on file  Occupational History  . Not on file  Tobacco Use  . Smoking status: Never Smoker  . Smokeless tobacco: Never Used  Vaping Use  . Vaping Use: Never used  Substance and Sexual Activity  . Alcohol use: No    Alcohol/week: 0.0 standard drinks  . Drug use: No  . Sexual activity: Not on file  Other Topics Concern  . Not on file  Social History Narrative   ** Merged History Encounter **       Social Determinants of Health   Financial Resource Strain: Not on file  Food Insecurity: Not on file  Transportation Needs: Not on file  Physical Activity: Not on file  Stress: Not on file  Social Connections: Not on file  Intimate Partner Violence: Not on file  Review of Systems: General: negative for chills, fever, night sweats or weight changes.  Cardiovascular: negative for chest pain, dyspnea on exertion, edema, orthopnea, palpitations, paroxysmal nocturnal dyspnea or shortness of breath Dermatological: negative for rash Respiratory: negative for cough or wheezing Urologic: negative for hematuria Abdominal: negative for nausea, vomiting, diarrhea, bright red blood per rectum, melena, or hematemesis Neurologic: negative for visual changes, syncope, or dizziness All other systems reviewed and are otherwise negative except as noted above.    Blood pressure 136/62, pulse (!) 59, height 5' 10.5" (1.791 m), weight 217 lb (98.4 kg), SpO2 98 %.  General appearance: alert and no distress Neck: no adenopathy, no carotid bruit, no JVD, supple, symmetrical, trachea midline and thyroid not enlarged, symmetric, no tenderness/mass/nodules Lungs: clear to auscultation bilaterally Heart: regular rate and rhythm, S1, S2 normal, no murmur, click, rub or gallop Extremities:  extremities normal, atraumatic, no cyanosis or edema Pulses: 2+ and symmetric Skin: Skin color, texture, turgor normal. No rashes or lesions Neurologic: Alert and oriented X 3, normal strength and tone. Normal symmetric reflexes. Normal coordination and gait  EKG sinus bradycardia at 59 without ST or T wave changes.  I personally reviewed this EKG.  ASSESSMENT AND PLAN:   Essential hypertension, benign History of essential hypertension blood pressure measured today 136/62.  He is on amlodipine and hydralazine.  Mixed hyperlipidemia History of mixed lipidemia not on statin therapy with lipid profile performed 12/12/2019 revealing total cholesterol 183, LDL 125 and HDL of 41, not at goal for primary prevention.  I am going to start him on atorvastatin 10 mg a day and we will recheck a lipid liver profile in 3 months  Critical lower limb ischemia History of a slowly healing ulcer at the base of his first metatarsal on the right.  His last Doppler studies performed 01/27/2019 showed widely patent vessels down to the foot.  His foot is warm and he is got a 2+ dorsalis pedis pulse.  The wound is slowly healing.  Its not inflamed or draining.  I do not think any further vascular work-up is necessary at this time.      Lorretta Harp MD [place], [facility] 09/28/2020 2:09 PM

## 2020-09-28 NOTE — Assessment & Plan Note (Signed)
History of mixed lipidemia not on statin therapy with lipid profile performed 12/12/2019 revealing total cholesterol 183, LDL 125 and HDL of 41, not at goal for primary prevention.  I am going to start him on atorvastatin 10 mg a day and we will recheck a lipid liver profile in 3 months

## 2020-10-01 ENCOUNTER — Ambulatory Visit (INDEPENDENT_AMBULATORY_CARE_PROVIDER_SITE_OTHER): Payer: BC Managed Care – PPO | Admitting: Podiatry

## 2020-10-01 ENCOUNTER — Other Ambulatory Visit: Payer: Self-pay

## 2020-10-01 DIAGNOSIS — L97512 Non-pressure chronic ulcer of other part of right foot with fat layer exposed: Secondary | ICD-10-CM | POA: Diagnosis not present

## 2020-10-01 DIAGNOSIS — E0843 Diabetes mellitus due to underlying condition with diabetic autonomic (poly)neuropathy: Secondary | ICD-10-CM | POA: Diagnosis not present

## 2020-10-01 DIAGNOSIS — I739 Peripheral vascular disease, unspecified: Secondary | ICD-10-CM | POA: Diagnosis not present

## 2020-10-01 DIAGNOSIS — L84 Corns and callosities: Secondary | ICD-10-CM | POA: Diagnosis not present

## 2020-10-01 NOTE — Progress Notes (Signed)
Subjective: 60 year old male presents the office today for follow-up of ulceration to his right foot.  He has been keeping the ointment on the wound daily.  He feels that since I last saw him the wound has been getting better.  He has followed with Dr. Alvester Chou as well and no intervention at this time.  Also asked me to look at the callus on his left foot to be trimmed.  No ulcerations of the left foot.  He has no other concerns today. Denies any fevers, chills, nausea, vomiting.  No calf pain, chest pain, shortness of breath.  Last A1c 11.4 on 04/24/2021  Objective: AAO x3, NAD- wife present  DP/PT pulses palpable bilaterally, CRT less than 3 seconds Sensation decreased with Semmes Weinstein monofilament On the right foot submetatarsal 1 with hyperkeratotic tissue.  Upon debridement of the wound today it measures about 0.6 x 0.2 x 0.2 cm.  There is no probing, undermining or tunneling.  There is no surrounding erythema, ascending cellulitis there is no drainage or pus or any obvious signs of infection. On the left foot hyperkeratotic lesion submetatarsal 1 without any underlying ulceration drainage or any signs of infection. No pain with calf compression, erythema or warmth.   Assessment: 60 year old male, ulceration right foot; PAD; uncontrolled diabetes; preulcerative callus left foot  Plan: -All treatment options discussed with the patient including all alternatives, risks, complications.  -Sharply debrided the wound utilize #312 with scalpel as well as a tissue nipper down to healthy, bleeding, viable tissue to remove nonviable, devitalized tissue in order to promote wound healing.  Continue daily dressing changes and offloading. -Debrided the callus on the left foot without any complications awaiting.  Moisturizer daily. -Glucose control  -Continue offloading.  Trula Slade DPM

## 2020-10-18 ENCOUNTER — Ambulatory Visit (INDEPENDENT_AMBULATORY_CARE_PROVIDER_SITE_OTHER): Payer: BC Managed Care – PPO | Admitting: Podiatry

## 2020-10-18 ENCOUNTER — Other Ambulatory Visit: Payer: Self-pay

## 2020-10-18 DIAGNOSIS — E0843 Diabetes mellitus due to underlying condition with diabetic autonomic (poly)neuropathy: Secondary | ICD-10-CM | POA: Diagnosis not present

## 2020-10-18 DIAGNOSIS — M79675 Pain in left toe(s): Secondary | ICD-10-CM | POA: Diagnosis not present

## 2020-10-18 DIAGNOSIS — B351 Tinea unguium: Secondary | ICD-10-CM | POA: Diagnosis not present

## 2020-10-18 DIAGNOSIS — L97512 Non-pressure chronic ulcer of other part of right foot with fat layer exposed: Secondary | ICD-10-CM

## 2020-10-18 DIAGNOSIS — M79674 Pain in right toe(s): Secondary | ICD-10-CM

## 2020-10-18 DIAGNOSIS — I739 Peripheral vascular disease, unspecified: Secondary | ICD-10-CM | POA: Diagnosis not present

## 2020-10-20 NOTE — Progress Notes (Signed)
Subjective: 60 year old male presents the office today for follow-up of ulceration to his right foot. He states the wound needs to be trimmed.  He is also cut back on his hours at work which seems to be helping his foot.  He continues with daily dressing changes. He is not seeing any swelling or redness or any drainage she has no new concerns.  Also asking for the nails to be trimmed today as are thickened he cannot do them himself.  They cause irritation inside shoes.  Denies any fevers, chills, nausea, vomiting.  Last A1c 11.4 on 04/24/2021 He has followed up with Dr. Gwenlyn Found for his circulation as well.   Objective: AAO x3, NAD- wife present  DP/PT pulses palpable bilaterally, CRT less than 3 seconds Sensation decreased with Semmes Weinstein monofilament On the right foot submetatarsal 1 with hyperkeratotic tissue.  Prior to debridement the wound is very small and covered with callus tissue.  After debridement the wound measured 0.3 x 0.2 x 0.1 cm.  There is no probing, undermining or tunneling.  No surrounding erythema, ascending cellulitis.  There is no fluctuation or crepitation.  There is no malodor. Nails are hypertrophic, dystrophic, brittle, discolored, elongated 10. No surrounding redness or drainage. Tenderness nails 1-5 bilaterally. No pain with calf compression, erythema or warmth.   Assessment: 60 year old male, ulceration right foot; PAD; uncontrolled diabetes  Plan: -All treatment options discussed with the patient including all alternatives, risks, complications.  -Sharply debrided the wound utilize #312 with scalpel as well as a tissue nipper down to healthy, bleeding, viable tissue to remove nonviable, devitalized tissue in order to promote wound healing.  Continue daily dressing changes and offloading. -Sharply debrided the nails x10 without any complications or bleeding. -Glucose control  -Continue offloading.  Trula Slade DPM

## 2020-10-29 ENCOUNTER — Telehealth: Payer: Self-pay

## 2020-10-29 NOTE — Telephone Encounter (Signed)
-----   Message from [name], [place] sent at 10/28/2020  6:24 PM EDT ----- He needs an office visit, be sure to get him scheduled the week I come back. I do believe he has an endocrinologist as well  ----- Message ----- From: Trula Slade, DPM Sent: 10/20/2020  10:34 AM EDT To: [name], FNP

## 2020-10-29 NOTE — Telephone Encounter (Signed)
Attempt to lvm. Mailbox is full. Patient needs appointment.

## 2020-10-30 ENCOUNTER — Telehealth: Payer: Self-pay

## 2020-10-30 NOTE — Telephone Encounter (Signed)
Unable to leave vm. Pt needs to be scheduled soon

## 2020-11-08 ENCOUNTER — Other Ambulatory Visit: Payer: Self-pay

## 2020-11-08 ENCOUNTER — Ambulatory Visit (INDEPENDENT_AMBULATORY_CARE_PROVIDER_SITE_OTHER): Payer: BC Managed Care – PPO | Admitting: Podiatry

## 2020-11-08 VITALS — Temp 97.3°F

## 2020-11-08 DIAGNOSIS — L97512 Non-pressure chronic ulcer of other part of right foot with fat layer exposed: Secondary | ICD-10-CM | POA: Diagnosis not present

## 2020-11-08 NOTE — Progress Notes (Signed)
Subjective: 60 year old male presents the office today for follow-up of ulceration to his right foot. He states the wound needs to be trimmed.  He states the area is callused over.  He has no pain.  Denies any swelling or redness or any drainage or pus.  He thinks overall the ulcer is doing much better.   Last A1c 11.4 on 04/24/2021 Has not checked blood sugar today. He has followed up with Dr. Gwenlyn Found for his circulation as well.   Objective: AAO x3, NAD- wife present  DP/PT pulses palpable bilaterally, CRT less than 3 seconds Sensation decreased with Semmes Weinstein monofilament On the right foot submetatarsal 1 with hyperkeratotic tissue.  After debridement of the wound still measuring 0.3 x 0.2 cm there is no probing, undermining or tunneling.  There is no surrounding erythema, ascending cellulitis there is no fluctuation or crepitation.  There is no malodor. No pain with calf compression, erythema or warmth.   Assessment: 60 year old male, ulceration right foot; uncontrolled diabetes  Plan: -All treatment options discussed with the patient including all alternatives, risks, complications.  -Sharply debrided the wound utilize #312 with scalpel as well as a tissue nipper down to healthy, bleeding, viable tissue to remove nonviable, devitalized tissue in order to promote wound healing.  Continue daily dressing changes and offloading. -Glucose control  -Continue offloading.  Trula Slade DPM

## 2020-12-07 ENCOUNTER — Other Ambulatory Visit: Payer: Self-pay

## 2020-12-07 ENCOUNTER — Ambulatory Visit (INDEPENDENT_AMBULATORY_CARE_PROVIDER_SITE_OTHER): Payer: BC Managed Care – PPO | Admitting: Podiatry

## 2020-12-07 DIAGNOSIS — L97512 Non-pressure chronic ulcer of other part of right foot with fat layer exposed: Secondary | ICD-10-CM

## 2020-12-07 NOTE — Progress Notes (Signed)
Subjective:   Patient ID: Raymond [name], male   DOB: 60 y.o.   MRN: [id number]   HPI Patient presents stating he wanted to get this ulcer looked at on the bottom of his right foot and states that it has not really changed but it is thick and its not been taking care of for around a month.  Also states he is got a keratotic lesion on the left and has not had any systemic signs of infection or other pathology   ROS      Objective:  Physical Exam  Neurovascular status unchanged no change in control of his diabetes with sugars running approximately 200.  Plantar aspect right shows moderate breakdown upon debridement with approximate 1 cm in length 3 mm in width with several millimeter of depth with no tendon muscle or bone exposure slight fat exposure noted localized with no proximal edema erythema drainage and keratotic lesions of first metatarsal head left     Assessment:  Moderate poor control diabetes with localized ulceration right plantar first metatarsal no indications of spread from previous treatments     Plan:  H&P reviewed condition and discussed local care and today Sharp debridement accomplished flushed the area I applied Iodosorb with sterile dressing and offloading and will be seen back again in 3 weeks.  He will begin wet-to-dry dressings to try to see if this can close up and I gave him strict instructions if any redness erythema edema or pathology were to occur he is to reappoint immediately.  Debrided on the lesion on the left no exposure noted

## 2020-12-10 ENCOUNTER — Ambulatory Visit: Payer: BC Managed Care – PPO | Admitting: Podiatry

## 2020-12-11 ENCOUNTER — Other Ambulatory Visit: Payer: Self-pay

## 2020-12-11 ENCOUNTER — Encounter: Payer: Self-pay | Admitting: Nurse Practitioner

## 2020-12-11 ENCOUNTER — Ambulatory Visit (INDEPENDENT_AMBULATORY_CARE_PROVIDER_SITE_OTHER): Payer: BC Managed Care – PPO | Admitting: Nurse Practitioner

## 2020-12-11 VITALS — BP 132/78 | HR 72 | Temp 98.6°F | Ht 70.5 in | Wt 205.6 lb

## 2020-12-11 DIAGNOSIS — E1122 Type 2 diabetes mellitus with diabetic chronic kidney disease: Secondary | ICD-10-CM | POA: Diagnosis not present

## 2020-12-11 DIAGNOSIS — N186 End stage renal disease: Secondary | ICD-10-CM | POA: Diagnosis not present

## 2020-12-11 DIAGNOSIS — I10 Essential (primary) hypertension: Secondary | ICD-10-CM

## 2020-12-11 DIAGNOSIS — I129 Hypertensive chronic kidney disease with stage 1 through stage 4 chronic kidney disease, or unspecified chronic kidney disease: Secondary | ICD-10-CM

## 2020-12-11 DIAGNOSIS — E559 Vitamin D deficiency, unspecified: Secondary | ICD-10-CM

## 2020-12-11 DIAGNOSIS — Z79899 Other long term (current) drug therapy: Secondary | ICD-10-CM

## 2020-12-11 DIAGNOSIS — I120 Hypertensive chronic kidney disease with stage 5 chronic kidney disease or end stage renal disease: Secondary | ICD-10-CM

## 2020-12-11 DIAGNOSIS — E1165 Type 2 diabetes mellitus with hyperglycemia: Secondary | ICD-10-CM

## 2020-12-11 DIAGNOSIS — E782 Mixed hyperlipidemia: Secondary | ICD-10-CM

## 2020-12-11 DIAGNOSIS — IMO0002 Reserved for concepts with insufficient information to code with codable children: Secondary | ICD-10-CM

## 2020-12-11 NOTE — Patient Instructions (Signed)

## 2020-12-11 NOTE — Progress Notes (Addendum)
I,[name],acting as a Education administrator for [employer], FNP.,have documented all relevant documentation on the behalf of [name], FNP,as directed by  [name], FNP while in the presence of [name], [place].  This visit occurred during the SARS-CoV-2 public health emergency.  Safety protocols were in place, including screening questions prior to the visit, additional usage of staff PPE, and extensive cleaning of exam room while observing appropriate contact time as indicated for disinfecting solutions.  Subjective:     Patient ID: Raymond [name] , male    DOB: 09-15-1960 , 60 y.o.   MRN: [id number]   Chief Complaint  Patient presents with   Hypertension   Hyperlipidemia   Diabetes    HPI  Patient is here for a follow up for htn dm and chol.  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 He lives in [place]. He has given him the clonidine.  [place] [facility] has not been seen in more than 3 months  He has a healing right diabetic foot ulcer to plantar surface. Left plantar area has a healed diabetic foot ulcer. He is being followed by the Podiatrist.he just had the area removed on Friday - calloused area - next appt Aug 11th.   He sees Dr. Clayton Bibles at [facility] for his eye exam   Hypertension This is a chronic problem. The current episode started more than 1 year ago. The problem is unchanged. The problem is controlled. Pertinent negatives include no anxiety, blurred vision, chest pain or palpitations.  Hyperlipidemia Pertinent negatives include no chest pain.  Diabetes He presents for his follow-up diabetic visit. He has type 2 diabetes mellitus. There are no hypoglycemic associated symptoms. Pertinent negatives for diabetes include no blurred vision and no chest pain. There are no hypoglycemic complications. There are no diabetic complications. Risk factors for coronary  artery disease include diabetes mellitus, dyslipidemia, male sex, obesity and sedentary lifestyle. Current diabetic treatment includes oral agent (dual therapy). He is following a generally healthy diet. When asked about meal planning, he reported none. He has not had a previous visit with a dietitian. He rarely participates in exercise. (High 200's.) An ACE inhibitor/angiotensin II receptor blocker is not being taken. Eye exam is not current.    Past Medical History:  Diagnosis Date   Chronic kidney disease    Awaiting transplant   Diabetes ([place])    Diabetes mellitus without complication ([place])    Hypertension    Renal disorder    Renal insufficiency      Family History  Problem Relation Age of Onset   Diabetes Mother    Heart disease Father    Colon cancer Neg Hx      Current Outpatient Medications:    atorvastatin (LIPITOR) 10 MG tablet, Take 1 tablet (10 mg total) by mouth daily., Disp: 90 tablet, Rfl: 3   blood glucose meter kit and supplies KIT, Dispense based on patient and insurance preference. Use up to four times daily as directed. (FOR ICD-9 250.00, 250.01)., Disp: 1 each, Rfl: 0   Blood Glucose Monitoring Suppl (ACCU-CHEK GUIDE ME) w/Device KIT, 1 Piece by Does not apply route as directed., Disp: 1 kit, Rfl: 0   cloNIDine (CATAPRES - DOSED IN MG/24 HR) 0.3 mg/24hr patch, Place 0.3 mg onto the skin once a week., Disp: , Rfl:    Continuous Blood Gluc Receiver (FREESTYLE LIBRE 2 READER)  DEVI, As directed, Disp: 1 each, Rfl: 0   Continuous Blood Gluc Sensor (FREESTYLE LIBRE 2 SENSOR) MISC, 1 Piece by Does not apply route every 14 (fourteen) days., Disp: 2 each, Rfl: 3   cyclobenzaprine (FLEXERIL) 5 MG tablet, Take 2 tablets (10 mg total) by mouth 3 (three) times daily as needed for muscle spasms., Disp: 12 tablet, Rfl: 0   dorzolamide-timolol (COSOPT) 22.3-6.8 MG/ML ophthalmic solution, Apply to eye., Disp: , Rfl:    doxazosin (CARDURA) 4 MG tablet, Take 6 mg by mouth daily. ,  Disp: , Rfl:    doxycycline (VIBRA-TABS) 100 MG tablet, Take 1 tablet (100 mg total) by mouth 2 (two) times daily., Disp: 20 tablet, Rfl: 0   glipiZIDE (GLUCOTROL XL) 5 MG 24 hr tablet, Take 5 mg by mouth daily. , Disp: , Rfl:    glucose blood (ACCU-CHEK GUIDE) test strip, Use as instructed 4 x daily. E11.65, Disp: 150 each, Rfl: 5   hydrALAZINE (APRESOLINE) 25 MG tablet, Take 25 mg by mouth 2 (two) times daily., Disp: , Rfl:    hydrOXYzine (ATARAX/VISTARIL) 25 MG tablet, Take 25 mg by mouth 3 (three) times daily as needed., Disp: , Rfl:    insulin glargine, 1 Unit Dial, (TOUJEO) 300 UNIT/ML Solostar Pen, Inject 80 Units into the skin at bedtime., Disp: 9 mL, Rfl: 1   Insulin Pen Needle (B-D ULTRAFINE III SHORT PEN) 31G X 8 MM MISC, 1 each by Does not apply route as directed., Disp: 100 each, Rfl: 3   Multiple Vitamin (MULTIVITAMIN) capsule, Take 1 capsule by mouth daily., Disp: , Rfl:    mupirocin ointment (BACTROBAN) 2 %, Apply 1 application topically 2 (two) times daily., Disp: 30 g, Rfl: 2   silver sulfADIAZINE (SILVADENE) 1 % cream, Apply 1 application topically daily., Disp: 50 g, Rfl: 0   sodium bicarbonate 650 MG tablet, Take 1,300 mg by mouth 3 (three) times daily., Disp: , Rfl:    tacrolimus (PROGRAF) 1 MG capsule, Take 4 mg by mouth 2 (two) times daily. Take 3 capsules (45m) in AM & take 3 caps (356m in PM, Disp: , Rfl:    triamcinolone (KENALOG) 0.025 % ointment, Apply 1 application topically 3 (three) times daily., Disp: , Rfl:    Vitamin D, Ergocalciferol, (DRISDOL) 50000 units CAPS capsule, Take 50,000 Units by mouth every 30 (thirty) days., Disp: , Rfl:    No Known Allergies   Review of Systems  Constitutional: Negative.   Eyes:  Negative for blurred vision.  Respiratory: Negative.    Cardiovascular: Negative.  Negative for chest pain, palpitations and leg swelling.  Gastrointestinal: Negative.   Neurological: Negative.   Psychiatric/Behavioral: Negative.      Today's  Vitals   12/11/20 0917  BP: 132/78  Pulse: 72  Temp: 98.6 F (37 C)  TempSrc: Oral  Weight: 205 lb 9.6 oz (93.3 kg)  Height: 5' 10.5" (1.791 m)   Body mass index is 29.08 kg/m.   Objective:  Physical Exam Constitutional:      General: He is not in acute distress.    Appearance: Normal appearance. He is obese.  Cardiovascular:     Rate and Rhythm: Normal rate and regular rhythm.     Pulses: Normal pulses.     Heart sounds: Normal heart sounds. No murmur heard. Pulmonary:     Effort: Pulmonary effort is normal. No respiratory distress.     Breath sounds: Normal breath sounds. No wheezing.  Neurological:     General: No focal  deficit present.     Mental Status: He is alert and oriented to person, place, and time.  Psychiatric:        Mood and Affect: Mood normal.        Behavior: Behavior normal.        Thought Content: Thought content normal.        Judgment: Judgment normal.        Assessment And Plan:     1. 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) ([place]) He has not seen Endocrinology in more than 3 months  Will make referral to Endocrinology to get him back established since his HgbA1c was poorly controlled Diabetic foot exam done - normal sensation but has callous area. Stressed the importance of follow up and adherence to care - Hemoglobin A1c - Ambulatory referral to Endocrinology  2. Benign hypertension with chronic kidney disease B/P is fairly controlled.  CMP ordered to check renal function.  The importance of regular exercise and dietary modification was stressed to the patient.  - CMP14+EGFR  3. Mixed hyperlipidemia Chronic, controlled Continue with current medications, tolerating well - Lipid panel  4. Other long term (current) drug therapy - CBC with Differential/Platelet  5. Vitamin D deficiency Will check vitamin D level and supplement as needed.    Also encouraged to spend 15 minutes in the sun daily.  -  Vitamin D (25 hydroxy)     Patient was given opportunity to ask questions. Patient verbalized understanding of the plan and was able to repeat key elements of the plan. All questions were answered to their satisfaction.  [name], FNP   I, [name], FNP, have reviewed all documentation for this visit. The documentation on 12/11/20 for the exam, diagnosis, procedures, and orders are all accurate and complete.   IF YOU HAVE BEEN REFERRED TO A SPECIALIST, IT MAY TAKE 1-2 WEEKS TO SCHEDULE/PROCESS THE REFERRAL. IF YOU HAVE NOT HEARD FROM US/SPECIALIST IN TWO WEEKS, PLEASE GIVE [place] A CALL AT [phone].   THE PATIENT IS ENCOURAGED TO PRACTICE SOCIAL DISTANCING DUE TO THE COVID-19 PANDEMIC.

## 2020-12-12 LAB — LIPID PANEL
Chol/HDL Ratio: 3.8 ratio (ref 0.0–5.0)
Cholesterol, Total: 148 mg/dL (ref 100–199)
HDL: 39 mg/dL — ABNORMAL LOW (ref >39)
LDL Chol Calc (NIH): 87 mg/dL (ref 0–99)
Triglycerides: 121 mg/dL (ref 0–149)
VLDL Cholesterol Cal: 22 mg/dL (ref 5–40)

## 2020-12-12 LAB — CMP14+EGFR
ALT: 22 IU/L (ref 0–44)
AST: 14 IU/L (ref 0–40)
Albumin/Globulin Ratio: 1 — ABNORMAL LOW (ref 1.2–2.2)
Albumin: 3.9 g/dL (ref 3.8–4.9)
Alkaline Phosphatase: 162 IU/L — ABNORMAL HIGH (ref 44–121)
BUN/Creatinine Ratio: 24 (ref 10–24)
BUN: 32 mg/dL — ABNORMAL HIGH (ref 8–27)
Bilirubin Total: 0.3 mg/dL (ref 0.0–1.2)
CO2: 25 mmol/L (ref 20–29)
Calcium: 10.2 mg/dL (ref 8.6–10.2)
Chloride: 96 mmol/L (ref 96–106)
Creatinine, Ser: 1.34 mg/dL — ABNORMAL HIGH (ref 0.76–1.27)
Globulin, Total: 3.9 g/dL (ref 1.5–4.5)
Glucose: 593 mg/dL (ref 65–99)
Potassium: 5.6 mmol/L — ABNORMAL HIGH (ref 3.5–5.2)
Sodium: 132 mmol/L — ABNORMAL LOW (ref 134–144)
Total Protein: 7.8 g/dL (ref 6.0–8.5)
eGFR: 61 mL/min/{1.73_m2} (ref >59)

## 2020-12-12 LAB — VITAMIN D 25 HYDROXY (VIT D DEFICIENCY, FRACTURES): Vit D, 25-Hydroxy: 23.8 ng/mL — ABNORMAL LOW (ref 30.0–100.0)

## 2020-12-12 LAB — CBC WITH DIFFERENTIAL/PLATELET
Basophils Absolute: 0 10*3/uL (ref 0.0–0.2)
Basos: 1 %
EOS (ABSOLUTE): 0.1 10*3/uL (ref 0.0–0.4)
Eos: 3 %
Hematocrit: 50.7 % (ref 37.5–51.0)
Hemoglobin: 15 g/dL (ref 13.0–17.7)
Immature Grans (Abs): 0 10*3/uL (ref 0.0–0.1)
Immature Granulocytes: 1 %
Lymphocytes Absolute: 1.2 10*3/uL (ref 0.7–3.1)
Lymphs: 31 %
MCH: 25.5 pg — ABNORMAL LOW (ref 26.6–33.0)
MCHC: 29.6 g/dL — ABNORMAL LOW (ref 31.5–35.7)
MCV: 86 fL (ref 79–97)
Monocytes Absolute: 0.3 10*3/uL (ref 0.1–0.9)
Monocytes: 8 %
Neutrophils Absolute: 2.2 10*3/uL (ref 1.4–7.0)
Neutrophils: 56 %
Platelets: 140 10*3/uL — ABNORMAL LOW (ref 150–450)
RBC: 5.88 x10E6/uL — ABNORMAL HIGH (ref 4.14–5.80)
RDW: 12.7 % (ref 11.6–15.4)
WBC: 3.9 10*3/uL (ref 3.4–10.8)

## 2020-12-12 LAB — HEMOGLOBIN A1C
Est. average glucose Bld gHb Est-mCnc: 378 mg/dL
Hgb A1c MFr Bld: 14.8 % — ABNORMAL HIGH (ref 4.8–5.6)

## 2020-12-13 ENCOUNTER — Other Ambulatory Visit: Payer: BC Managed Care – PPO

## 2020-12-13 ENCOUNTER — Other Ambulatory Visit: Payer: Self-pay

## 2020-12-13 ENCOUNTER — Other Ambulatory Visit: Payer: Self-pay | Admitting: Nurse Practitioner

## 2020-12-13 DIAGNOSIS — E875 Hyperkalemia: Secondary | ICD-10-CM

## 2020-12-13 NOTE — Progress Notes (Signed)
Repeat BMP for potassium and glucose

## 2020-12-14 ENCOUNTER — Other Ambulatory Visit: Payer: Self-pay

## 2020-12-14 LAB — BMP8+EGFR
BUN/Creatinine Ratio: 21 (ref 10–24)
BUN: 30 mg/dL — ABNORMAL HIGH (ref 8–27)
CO2: 22 mmol/L (ref 20–29)
Calcium: 9.6 mg/dL (ref 8.6–10.2)
Chloride: 95 mmol/L — ABNORMAL LOW (ref 96–106)
Creatinine, Ser: 1.45 mg/dL — ABNORMAL HIGH (ref 0.76–1.27)
Glucose: 689 mg/dL (ref 65–99)
Potassium: 5 mmol/L (ref 3.5–5.2)
Sodium: 134 mmol/L (ref 134–144)
eGFR: 55 mL/min/{1.73_m2} — ABNORMAL LOW (ref >59)

## 2020-12-14 MED ORDER — NOVOLOG FLEXPEN 100 UNIT/ML ~~LOC~~ SOPN: 10.0000 [IU] | PEN_INJECTOR | Freq: Three times a day (TID) | SUBCUTANEOUS | 0 refills | Status: DC

## 2021-01-03 ENCOUNTER — Ambulatory Visit (INDEPENDENT_AMBULATORY_CARE_PROVIDER_SITE_OTHER): Payer: BC Managed Care – PPO | Admitting: Podiatry

## 2021-01-03 ENCOUNTER — Ambulatory Visit (INDEPENDENT_AMBULATORY_CARE_PROVIDER_SITE_OTHER): Payer: BC Managed Care – PPO

## 2021-01-03 ENCOUNTER — Other Ambulatory Visit: Payer: Self-pay

## 2021-01-03 DIAGNOSIS — L97512 Non-pressure chronic ulcer of other part of right foot with fat layer exposed: Secondary | ICD-10-CM

## 2021-01-03 MED ORDER — DOXYCYCLINE HYCLATE 100 MG PO TABS: 100.0000 mg | ORAL_TABLET | Freq: Two times a day (BID) | ORAL | 0 refills | Status: DC

## 2021-01-08 NOTE — Progress Notes (Signed)
Subjective: 60 year old male presents the office today for follow-up of ulceration to his right foot.  He feels that the wound has worsened since last appointment.  He is continue daily dressing changes.  No swelling or redness but states that occasionally he will bleed.  He denies fevers or chills.  No other concerns.    Last A1c 14.8 on 12/11/2020 He has followed up with Dr. Gwenlyn Found for his circulation as well.   Objective: AAO x3, NAD- wife present  DP/PT pulses palpable bilaterally, CRT less than 3 seconds Sensation decreased with Semmes Weinstein monofilament On the right foot submetatarsal 1 with hyperkeratotic tissue.  The wound is larger today after debridement I does measure 1.5 x 1 x 0.4 cm.  Prior to debridement was smaller given the hyperkeratotic periwound and measures 1.3 x 0.8 x 0.3 cm.  There is no probing to bone, undermining or tunneling.  No surrounding erythema, ascending cellulitis there is no fluctuation crepitation.  There is no malodor. No pain with calf compression, erythema or warmth.   Assessment: 60 year old male, ulceration right foot; uncontrolled diabetes  Plan: -All treatment options discussed with the patient including all alternatives, risks, complications.  -X-rays obtained reviewed.  Arthritic changes present the first MPJ. -Sharply debrided the wound utilize #312 with scalpel as well as a tissue nipper down to healthy, bleeding, viable tissue to remove nonviable, devitalized tissue in order to promote wound healing.  No significant blood loss.  Tolerated well.  -Prisma dressing changes. -Given the worsening wound and start antibiotics, doxycycline. -Unfortunate given his significantly uncontrolled diabetes is likely contributing to the poor wound healing.  He needs to work with glucose control. -Monitor for any clinical signs or symptoms of infection and directed to call the office immediately should any occur or go to the ER.  No follow-ups on  file.  Trula Slade DPM      Continue daily dressing changes and offloading. -Glucose control  -Continue offloading.  Trula Slade DPM

## 2021-01-28 ENCOUNTER — Other Ambulatory Visit: Payer: Self-pay | Admitting: "Endocrinology

## 2021-01-28 DIAGNOSIS — E1122 Type 2 diabetes mellitus with diabetic chronic kidney disease: Secondary | ICD-10-CM

## 2021-01-28 DIAGNOSIS — IMO0002 Reserved for concepts with insufficient information to code with codable children: Secondary | ICD-10-CM

## 2021-01-30 ENCOUNTER — Other Ambulatory Visit: Payer: Self-pay

## 2021-01-30 ENCOUNTER — Telehealth: Payer: Self-pay | Admitting: *Deleted

## 2021-01-30 DIAGNOSIS — E1122 Type 2 diabetes mellitus with diabetic chronic kidney disease: Secondary | ICD-10-CM

## 2021-01-30 DIAGNOSIS — IMO0002 Reserved for concepts with insufficient information to code with codable children: Secondary | ICD-10-CM

## 2021-01-30 MED ORDER — INSULIN GLARGINE (1 UNIT DIAL) 300 UNIT/ML ~~LOC~~ SOPN: 80.0000 [IU] | PEN_INJECTOR | Freq: Every day | SUBCUTANEOUS | 1 refills | Status: DC

## 2021-01-30 NOTE — Telephone Encounter (Signed)
Patient is calling because his foot has split. Returned the call back to patient to get more details and recommending that he go to ER asap,no answer, left voice message for call back.

## 2021-01-31 ENCOUNTER — Other Ambulatory Visit: Payer: Self-pay

## 2021-01-31 ENCOUNTER — Ambulatory Visit (INDEPENDENT_AMBULATORY_CARE_PROVIDER_SITE_OTHER): Payer: BC Managed Care – PPO | Admitting: Podiatry

## 2021-01-31 DIAGNOSIS — M86052 Acute hematogenous osteomyelitis, left femur: Secondary | ICD-10-CM

## 2021-01-31 DIAGNOSIS — M86171 Other acute osteomyelitis, right ankle and foot: Secondary | ICD-10-CM | POA: Diagnosis not present

## 2021-01-31 MED ORDER — DOXYCYCLINE HYCLATE 100 MG PO TABS: 100.0000 mg | ORAL_TABLET | Freq: Two times a day (BID) | ORAL | 0 refills | Status: DC

## 2021-01-31 MED ORDER — SILVER SULFADIAZINE 1 % EX CREA: 1.0000 "application " | TOPICAL_CREAM | Freq: Every day | CUTANEOUS | 0 refills | Status: DC

## 2021-02-01 ENCOUNTER — Telehealth: Payer: Self-pay | Admitting: *Deleted

## 2021-02-01 LAB — CBC WITH DIFFERENTIAL/PLATELET
Absolute Monocytes: 482 cells/uL (ref 200–950)
Basophils Absolute: 30 cells/uL (ref 0–200)
Basophils Relative: 0.7 %
Eosinophils Absolute: 159 cells/uL (ref 15–500)
Eosinophils Relative: 3.7 %
HCT: 49.2 % (ref 38.5–50.0)
Hemoglobin: 14.7 g/dL (ref 13.2–17.1)
Lymphs Abs: 1127 cells/uL (ref 850–3900)
MCH: 25.6 pg — ABNORMAL LOW (ref 27.0–33.0)
MCHC: 29.9 g/dL — ABNORMAL LOW (ref 32.0–36.0)
MCV: 85.6 fL (ref 80.0–100.0)
MPV: 12.9 fL — ABNORMAL HIGH (ref 7.5–12.5)
Monocytes Relative: 11.2 %
Neutro Abs: 2503 cells/uL (ref 1500–7800)
Neutrophils Relative %: 58.2 %
Platelets: 130 10*3/uL — ABNORMAL LOW (ref 140–400)
RBC: 5.75 10*6/uL (ref 4.20–5.80)
RDW: 13.3 % (ref 11.0–15.0)
Total Lymphocyte: 26.2 %
WBC: 4.3 10*3/uL (ref 3.8–10.8)

## 2021-02-01 LAB — BASIC METABOLIC PANEL
BUN/Creatinine Ratio: 25 (calc) — ABNORMAL HIGH (ref 6–22)
BUN: 31 mg/dL — ABNORMAL HIGH (ref 7–25)
CO2: 27 mmol/L (ref 20–32)
Calcium: 9.9 mg/dL (ref 8.6–10.3)
Chloride: 105 mmol/L (ref 98–110)
Creat: 1.25 mg/dL (ref 0.70–1.35)
Glucose, Bld: 349 mg/dL — ABNORMAL HIGH (ref 65–99)
Potassium: 5.1 mmol/L (ref 3.5–5.3)
Sodium: 139 mmol/L (ref 135–146)

## 2021-02-01 LAB — C-REACTIVE PROTEIN: CRP: 7.6 mg/L (ref <8.0)

## 2021-02-01 LAB — SEDIMENTATION RATE: Sed Rate: 25 mm/h — ABNORMAL HIGH (ref 0–20)

## 2021-02-01 NOTE — Telephone Encounter (Signed)
-----   Message from Trula Slade, DPM sent at 02/01/2021 10:27 AM EDT ----- Lattie Haw- can you please let him know that the blood work is mostly normal from an infection standpoint. The WBC is normal as well as the CRP. The sed rate was slightly elevated. His sugar was high at 349. He is awaiting a MRI which I ordered for [facility].

## 2021-02-01 NOTE — Telephone Encounter (Signed)
Called and spoke with the patient and relayed the message per Dr Jacqualyn Posey and the MRI scheduled at [facility] is on 02-08-2021 at 1:00 pm. Raymond [name]

## 2021-02-05 NOTE — Progress Notes (Signed)
Subjective: 60 year old male presents the office today for follow-up of ulceration to his right foot.  He states that he does not feel that the wound is healing at this time.  The wound had almost healed however opened back up and is now remained about the same if not getting worse.  He has not seen any drainage or pus.  He finished a course of antibiotics.  He has no fevers or chills.   Last A1c 14.8 on 12/11/2020 He has followed up with Dr. Gwenlyn Found for his circulation as well.   Objective: AAO x3, NAD- wife present  DP/PT pulses palpable bilaterally, CRT less than 3 seconds Sensation decreased with Semmes Weinstein monofilament On the right foot submetatarsal 1 with hyperkeratotic tissue.  The wound is larger today after debridement I does measure 1.7 x 1.3 x 0.5 cm.  Prior to debridement was smaller given the hyperkeratotic periwound and measures 1.5 x 1 x 0.3 cm.  There is no probing to bone, undermining or tunneling.  No surrounding erythema, ascending cellulitis there is no fluctuation crepitation.  There is no malodor. No pain with calf compression, erythema or warmth.   Assessment: 60 year old male, ulceration right foot; uncontrolled diabetes  Plan: -All treatment options discussed with the patient including all alternatives, risks, complications.  -Sharply debrided the wound utilize #312 with scalpel as well as a tissue nipper down to healthy, bleeding, viable tissue to remove nonviable, devitalized tissue in order to promote wound healing.  No significant blood loss.  Tolerated well.  -Prisma dressing changes. -Is still not improving I will order MRI to evaluate for any underlying osteomyelitis.  Blood work was ordered today including CRP, sed rate, BMP, CBC. -He is wearing a regular shoe today and I strongly encouraged offloading wearing surgical shoe.  Also discussed glucose control given his uncontrolled diabetes which is also contributing likely to the poor wound healing. -Monitor  for any clinical signs or symptoms of infection and directed to call the office immediately should any occur or go to the ER.   Trula Slade DPM  Cc: Dr. Quay Burow, MD

## 2021-02-06 ENCOUNTER — Telehealth: Payer: Self-pay | Admitting: Podiatry

## 2021-02-06 NOTE — Telephone Encounter (Signed)
Tonya with [facility] Pre-service cenbter called and stated that Patient has an appointment on 02/08/2021 for an MRI and it needs a pre-cert by tomorrow at 1pm. Please advise

## 2021-02-07 NOTE — Telephone Encounter (Signed)
Raymond [name] and I just completed this. It doesn't require pre-cert according to [employer].

## 2021-02-08 ENCOUNTER — Ambulatory Visit (HOSPITAL_COMMUNITY): Payer: BC Managed Care – PPO

## 2021-02-12 ENCOUNTER — Encounter: Payer: Self-pay | Admitting: Podiatry

## 2021-02-12 ENCOUNTER — Ambulatory Visit (INDEPENDENT_AMBULATORY_CARE_PROVIDER_SITE_OTHER): Payer: BC Managed Care – PPO | Admitting: Podiatry

## 2021-02-12 ENCOUNTER — Other Ambulatory Visit: Payer: Self-pay

## 2021-02-12 VITALS — Temp 99.2°F

## 2021-02-12 DIAGNOSIS — L03119 Cellulitis of unspecified part of limb: Secondary | ICD-10-CM | POA: Diagnosis not present

## 2021-02-12 DIAGNOSIS — M86171 Other acute osteomyelitis, right ankle and foot: Secondary | ICD-10-CM | POA: Diagnosis not present

## 2021-02-12 DIAGNOSIS — I739 Peripheral vascular disease, unspecified: Secondary | ICD-10-CM | POA: Diagnosis not present

## 2021-02-12 DIAGNOSIS — L02619 Cutaneous abscess of unspecified foot: Secondary | ICD-10-CM

## 2021-02-12 DIAGNOSIS — L97512 Non-pressure chronic ulcer of other part of right foot with fat layer exposed: Secondary | ICD-10-CM | POA: Diagnosis not present

## 2021-02-12 MED ORDER — DOXYCYCLINE HYCLATE 100 MG PO TABS: 100.0000 mg | ORAL_TABLET | Freq: Two times a day (BID) | ORAL | 0 refills | Status: DC

## 2021-02-15 ENCOUNTER — Other Ambulatory Visit: Payer: Self-pay | Admitting: Podiatry

## 2021-02-15 LAB — WOUND CULTURE
MICRO NUMBER:: 12397877
SPECIMEN QUALITY:: ADEQUATE

## 2021-02-15 MED ORDER — AMOXICILLIN-POT CLAVULANATE 875-125 MG PO TABS: 1.0000 | ORAL_TABLET | Freq: Two times a day (BID) | ORAL | 0 refills | Status: DC

## 2021-02-15 NOTE — Progress Notes (Signed)
Subjective: 60 year old male presents the office today for follow-up of ulceration to his right foot.  He thinks wound is doing somewhat better.  Currently have the MRI scheduled but he states its been moved to next month.  He has not seen any purulence.  No increase in swelling or redness to his foot.  No fevers or chills.  No other concerns.  Last A1c 14.8 on 12/11/2020 He has followed up with Dr. Gwenlyn Found for his circulation as well.   Objective: AAO x3, NAD- wife present  DP/PT pulses palpable bilaterally, CRT less than 3 seconds Sensation decreased with Semmes Weinstein monofilament On the right foot submetatarsal 1 with hyperkeratotic tissue.  The wound is larger today after debridement I does measure 1.7 x 1 x 0.5 cm.  Prior to debridement was smaller given the hyperkeratotic periwound and measures 1.5 x 0.8 x 0.3 cm.  There is no probing to bone, undermining or tunneling.  Small mount of clear drainage expressed.  No purulence.  No surrounding erythema, ascending cellulitis there is no fluctuation crepitation.  There is no malodor. No pain with calf compression, erythema or warmth.  Assessment: 60 year old male, ulceration right foot; uncontrolled diabetes  Plan: -All treatment options discussed with the patient including all alternatives, risks, complications.  -Sharply debrided the wound utilize #312 with scalpel as well as a tissue nipper down to healthy, bleeding, viable tissue to remove nonviable, devitalized tissue in order to promote wound healing.  No significant blood loss.  Tolerated well.  -Wound cultures obtained today. -Awaiting MRI. -Finished course of doxycycline and will determine if any further antibiotics or change antibiotics are necessary based on the wound culture. -Reviewed blood work.  Sed rate slightly elevated.  White cell count, CRP normal.  Elevated sugar. -Monitor for any clinical signs or symptoms of infection and directed to call the office immediately should  any occur or go to the ER.  Return in about 2 weeks (around 02/26/2021) for ulcer.  Trula Slade DPM

## 2021-02-19 ENCOUNTER — Telehealth: Payer: Self-pay | Admitting: *Deleted

## 2021-02-19 NOTE — Telephone Encounter (Signed)
Sorry , was working on it, completed

## 2021-02-19 NOTE — Telephone Encounter (Signed)
Patient is calling because he said that they cancelled his Sept. 16 th appointment because prior authorization is needed from [employer]. Called [facility] Na to inform that per Ashley's note(02/06/21),have called [employer] and no prior authorization is required. They have rescheduled patient 03/01/21@10 :00,arrival time-9:30 am,patient has been notified.

## 2021-02-20 ENCOUNTER — Telehealth: Payer: Self-pay | Admitting: Podiatry

## 2021-02-20 ENCOUNTER — Telehealth: Payer: Self-pay | Admitting: *Deleted

## 2021-02-20 NOTE — Telephone Encounter (Signed)
done

## 2021-02-20 NOTE — Telephone Encounter (Signed)
Raymond [name] with the Pre-Service center called, she can be reached on [phone], ext [phone]. Raymond [name] needs pre authorization for MRI scheduled 02/22/2021.

## 2021-02-20 NOTE — Telephone Encounter (Signed)
Called scheduling, patient has been rescheduled for a sooner appointment for MRI [facility] 02/22/21@1 :00 ,arrival time is 12:30,patient has been contacted and appointment confirmed.

## 2021-02-21 ENCOUNTER — Telehealth: Payer: Self-pay | Admitting: *Deleted

## 2021-02-21 NOTE — Telephone Encounter (Signed)
February 07, 2021 Me Note Estill Bamberg and I just completed this. It doesn't require pre-cert according to [employer].      This was done on 9/15, its documented in the chart, so not sure why they are still requesting it?!?

## 2021-02-21 NOTE — Telephone Encounter (Signed)
Called [employer],does require Prior Chief Strategy Officer.,#[id number],WBEQUHK Merilyn Baba from 02/21/21-03/21/21

## 2021-02-21 NOTE — Telephone Encounter (Signed)
Called [employer](AIM),has been pre-authorized patient for MRI right foot, w/o constrast #[id number] from 02/21/21-04/21/21 XUJ#[id number] Contact :Ramia W. Called to reschedule patient [facility]

## 2021-02-21 NOTE — Telephone Encounter (Signed)
Called patient and informed that was a mix up with his insurance,has been pre- authorized today, information now in epic. Will follow up tomorrow for scheduling and give him a call back .

## 2021-02-22 ENCOUNTER — Other Ambulatory Visit: Payer: Self-pay | Admitting: Podiatry

## 2021-02-22 ENCOUNTER — Other Ambulatory Visit: Payer: Self-pay

## 2021-02-22 ENCOUNTER — Ambulatory Visit (HOSPITAL_COMMUNITY): Admission: RE | Admit: 2021-02-22 | Payer: BC Managed Care – PPO | Source: Ambulatory Visit

## 2021-02-22 ENCOUNTER — Telehealth: Payer: Self-pay | Admitting: *Deleted

## 2021-02-22 ENCOUNTER — Ambulatory Visit (HOSPITAL_COMMUNITY): Payer: BC Managed Care – PPO

## 2021-02-22 ENCOUNTER — Ambulatory Visit (HOSPITAL_COMMUNITY)
Admission: RE | Admit: 2021-02-22 | Discharge: 2021-02-22 | Disposition: A | Discharge status: Home / Self Care | Payer: BC Managed Care – PPO | Source: Ambulatory Visit | Attending: Podiatry | Admitting: Podiatry

## 2021-02-22 DIAGNOSIS — M8600 Acute hematogenous osteomyelitis, unspecified site: Secondary | ICD-10-CM

## 2021-02-22 NOTE — Telephone Encounter (Signed)
Patient is calling and has completed his MRI, anxious for results. Already in epic to view.Please advise.

## 2021-02-22 NOTE — Telephone Encounter (Signed)
Patient is coming in as a work in patient at [facility] his MRI, they are aware. He is coming from [facility] and has confirmed that appointment for today.02/22/21.

## 2021-02-26 ENCOUNTER — Other Ambulatory Visit: Payer: Self-pay

## 2021-02-26 ENCOUNTER — Encounter: Payer: Self-pay | Admitting: Podiatry

## 2021-02-26 ENCOUNTER — Ambulatory Visit (INDEPENDENT_AMBULATORY_CARE_PROVIDER_SITE_OTHER): Payer: BC Managed Care – PPO | Admitting: Podiatry

## 2021-02-26 DIAGNOSIS — I739 Peripheral vascular disease, unspecified: Secondary | ICD-10-CM

## 2021-02-26 DIAGNOSIS — L97512 Non-pressure chronic ulcer of other part of right foot with fat layer exposed: Secondary | ICD-10-CM

## 2021-02-26 DIAGNOSIS — M8600 Acute hematogenous osteomyelitis, unspecified site: Secondary | ICD-10-CM | POA: Diagnosis not present

## 2021-02-26 NOTE — Patient Instructions (Signed)

## 2021-02-27 ENCOUNTER — Telehealth: Payer: Self-pay | Admitting: Urology

## 2021-02-27 NOTE — Telephone Encounter (Addendum)
DOS - 03/06/21   ULCER DEBRIDEMENT RIGHT --- 11043 BONE BIOPSY RIGHT --- 27800 APPLICATION SKIN GRAFT RIGHT --- [id number]  [employer] EFFECTIVE DATE - 05/26/20  PLAN DEDUCTIBLE - $3,600.00 W/  $0.00 REMAINING OUT OF POCKET - $6,750.00 W/ $3,719.00 REMAINING COINSURANCE - 0% COPAY - $0.00   NO PRIOR AUTH REQUIRED

## 2021-02-28 ENCOUNTER — Encounter: Payer: Self-pay | Admitting: Nurse Practitioner

## 2021-02-28 ENCOUNTER — Ambulatory Visit (INDEPENDENT_AMBULATORY_CARE_PROVIDER_SITE_OTHER): Payer: BC Managed Care – PPO | Admitting: Nurse Practitioner

## 2021-02-28 ENCOUNTER — Other Ambulatory Visit: Payer: Self-pay

## 2021-02-28 VITALS — BP 120/86 | HR 66 | Temp 97.6°F | Ht 70.0 in | Wt 204.2 lb

## 2021-02-28 DIAGNOSIS — E1165 Type 2 diabetes mellitus with hyperglycemia: Secondary | ICD-10-CM

## 2021-02-28 DIAGNOSIS — E11621 Type 2 diabetes mellitus with foot ulcer: Secondary | ICD-10-CM

## 2021-02-28 DIAGNOSIS — L97518 Non-pressure chronic ulcer of other part of right foot with other specified severity: Secondary | ICD-10-CM

## 2021-02-28 NOTE — Progress Notes (Addendum)
I,Katawbba Wiggins,acting as a Education administrator for [employer], FNP.,have documented all relevant documentation on the behalf of [name], FNP,as directed by  [name], FNP while in the presence of [name], [place].   This visit occurred during the SARS-CoV-2 public health emergency.  Safety protocols were in place, including screening questions prior to the visit, additional usage of staff PPE, and extensive cleaning of exam room while observing appropriate contact time as indicated for disinfecting solutions.  Subjective:     Patient ID: Raymond [name] , male    DOB: March 20, 1961 , 60 y.o.   MRN: [id number]   Chief Complaint  Patient presents with   coomplete forms     HPI  The patient is here today for completion of forms.  He is having a procedure done to his right foot to see if he has infection.  His appt with Endocrinology is on October 11th.  Toujeo 80 units at night. He is currently out of his medications for over one week. He is not taking Novolog 10 units with meals. He had been out of medications twice since being seen by the provider.  He will take it when his blood sugar is greater than 250.  Over the last week he has been giving himself 12 units once a day of Novolog. He has been using his diet to regulate his blood sugar. He denies ever seeing a diabetic educator.   He has had a kidney transplant done in [place].     Past Medical History:  Diagnosis Date   Chronic kidney disease    Awaiting transplant   Diabetes ([place])    Diabetes mellitus without complication ([place])    Hypertension    Renal disorder    Renal insufficiency      Family History  Problem Relation Age of Onset   Diabetes Mother    Heart disease Father    Colon cancer Neg Hx      Current Outpatient Medications:    amoxicillin-clavulanate (AUGMENTIN) 875-125 MG tablet, Take 1 tablet by mouth 2 (two) times daily., Disp: 20 tablet, Rfl: 0   atorvastatin (LIPITOR) 10 MG tablet, Take 1 tablet (10 mg  total) by mouth daily., Disp: 90 tablet, Rfl: 3   blood glucose meter kit and supplies KIT, Dispense based on patient and insurance preference. Use up to four times daily as directed. (FOR ICD-9 250.00, 250.01)., Disp: 1 each, Rfl: 0   Blood Glucose Monitoring Suppl (ACCU-CHEK GUIDE ME) w/Device KIT, 1 Piece by Does not apply route as directed., Disp: 1 kit, Rfl: 0   brimonidine (ALPHAGAN) 0.2 % ophthalmic solution, Apply to eye., Disp: , Rfl:    cloNIDine (CATAPRES - DOSED IN MG/24 HR) 0.3 mg/24hr patch, Place 0.3 mg onto the skin once a week., Disp: , Rfl:    Continuous Blood Gluc Receiver (FREESTYLE LIBRE 2 READER) DEVI, As directed, Disp: 1 each, Rfl: 0   Continuous Blood Gluc Sensor (FREESTYLE LIBRE 2 SENSOR) MISC, 1 Piece by Does not apply route every 14 (fourteen) days., Disp: 2 each, Rfl: 3   cyclobenzaprine (FLEXERIL) 5 MG tablet, Take 2 tablets (10 mg total) by mouth 3 (three) times daily as needed for muscle spasms., Disp: 12 tablet, Rfl: 0   dorzolamide-timolol (COSOPT) 22.3-6.8 MG/ML ophthalmic solution, Apply to eye., Disp: , Rfl:    doxazosin (CARDURA) 4 MG tablet, Take 6 mg by mouth daily. , Disp: , Rfl:    glipiZIDE (GLUCOTROL XL) 5 MG 24 hr tablet, Take 5 mg  by mouth daily. , Disp: , Rfl:    glucose blood (ACCU-CHEK GUIDE) test strip, Use as instructed 4 x daily. E11.65, Disp: 150 each, Rfl: 5   hydrALAZINE (APRESOLINE) 25 MG tablet, Take 25 mg by mouth 2 (two) times daily., Disp: , Rfl:    hydrOXYzine (ATARAX/VISTARIL) 25 MG tablet, Take 25 mg by mouth 3 (three) times daily as needed., Disp: , Rfl:    insulin aspart (NOVOLOG FLEXPEN) 100 UNIT/ML FlexPen, Inject 10 Units into the skin 3 (three) times daily before meals., Disp: 15 mL, Rfl: 0   insulin glargine, 1 Unit Dial, (TOUJEO) 300 UNIT/ML Solostar Pen, Inject 80 Units into the skin at bedtime., Disp: 9 mL, Rfl: 1   Insulin Pen Needle (B-D ULTRAFINE III SHORT PEN) 31G X 8 MM MISC, 1 each by Does not apply route as directed.,  Disp: 100 each, Rfl: 3   latanoprost (XALATAN) 0.005 % ophthalmic solution, Apply to eye., Disp: , Rfl:    Multiple Vitamin (MULTIVITAMIN) capsule, Take 1 capsule by mouth daily., Disp: , Rfl:    mupirocin ointment (BACTROBAN) 2 %, Apply 1 application topically 2 (two) times daily., Disp: 30 g, Rfl: 2   silver sulfADIAZINE (SILVADENE) 1 % cream, Apply 1 application topically daily., Disp: 50 g, Rfl: 0   sodium bicarbonate 650 MG tablet, Take 1,300 mg by mouth 3 (three) times daily., Disp: , Rfl:    tacrolimus (PROGRAF) 1 MG capsule, Take 4 mg by mouth 2 (two) times daily. Take 3 capsules (66m) in AM & take 3 caps (332m in PM, Disp: , Rfl:    triamcinolone (KENALOG) 0.025 % ointment, Apply 1 application topically 3 (three) times daily., Disp: , Rfl:    Vitamin D, Ergocalciferol, (DRISDOL) 50000 units CAPS capsule, Take 50,000 Units by mouth every 30 (thirty) days., Disp: , Rfl:    No Known Allergies   Review of Systems  Constitutional: Negative.   Respiratory: Negative.    Cardiovascular: Negative.   Skin:        Has a right foot ulcer  Neurological:  Negative for dizziness and headaches.  Psychiatric/Behavioral: Negative.      Today's Vitals   02/28/21 0926  BP: 120/86  Pulse: 66  Temp: 97.6 F (36.4 C)  Weight: 204 lb 3.2 oz (92.6 kg)  Height: '5\' 10"'  (1.778 m)  PainSc: 0-No pain   Body mass index is 29.3 kg/m.  Wt Readings from Last 3 Encounters:  02/28/21 204 lb 3.2 oz (92.6 kg)  12/11/20 205 lb 9.6 oz (93.3 kg)  09/28/20 217 lb (98.4 kg)    BP Readings from Last 3 Encounters:  02/28/21 120/86  12/11/20 132/78  09/28/20 136/62    Objective:  Physical Exam Constitutional:      General: He is not in acute distress.    Appearance: Normal appearance. He is obese.  Cardiovascular:     Rate and Rhythm: Normal rate and regular rhythm.     Pulses: Normal pulses.     Heart sounds: Normal heart sounds. No murmur heard. Pulmonary:     Effort: Pulmonary effort is normal. No  respiratory distress.     Breath sounds: Normal breath sounds. No wheezing.  Skin:    Capillary Refill: Capillary refill takes less than 2 seconds.  Neurological:     General: No focal deficit present.     Mental Status: He is alert and oriented to person, place, and time.     Cranial Nerves: No cranial nerve deficit.  Motor: No weakness.  Psychiatric:        Mood and Affect: Mood normal.        Behavior: Behavior normal.        Thought Content: Thought content normal.        Judgment: Judgment normal.        Assessment And Plan:     1. Uncontrolled type 2 diabetes mellitus with hyperglycemia ([place]) Comments: HgbA1c is poorly controlled, he has been out of his Toujeo for approximately 1 week. Samples of Toujeu given. He is to take Novolog 10 units before meals. He is due to see Dr Loanne Drilling this month. I will also get him in contact with a diabetic educator. - CMP14+EGFR - Hemoglobin A1c  2. Diabetic ulcer of toe of right foot associated with type 2 diabetes mellitus, with other ulcer severity ([place]) Comments: At this time I do not think he should have surgery to his foot due to an elevated HgbA1c, was 14.1 at last visit. He is to see Dr. Loanne Drilling this month. - CBC   Addendum: 03/05/2021 - patient is to have surgery on his right foot for osteomyelitis and it is necessary for him to have this surgery regardless of his HgbA1c, will notify patient. He has also seen Endocrinology earlier this morning. I have completed the forms and faxed to [facility].   Patient was given opportunity to ask questions. Patient verbalized understanding of the plan and was able to repeat key elements of the plan. All questions were answered to their satisfaction.  [name], FNP   I, [name], FNP, have reviewed all documentation for this visit. The documentation on 02/28/21 for the exam, diagnosis, procedures, and orders are all accurate and complete.   IF YOU HAVE BEEN REFERRED TO A  SPECIALIST, IT MAY TAKE 1-2 WEEKS TO SCHEDULE/PROCESS THE REFERRAL. IF YOU HAVE NOT HEARD FROM US/SPECIALIST IN TWO WEEKS, PLEASE GIVE [place] A CALL AT [phone] X [phone].   THE PATIENT IS ENCOURAGED TO PRACTICE SOCIAL DISTANCING DUE TO THE COVID-19 PANDEMIC.

## 2021-02-28 NOTE — Progress Notes (Signed)
Please enter orders for PAT visit scheduled 03-05-21.[name]

## 2021-02-28 NOTE — Progress Notes (Addendum)
COVID swab appointment:  N/A  COVID Vaccine Completed: Yes x2 Date COVID Vaccine completed: 09-02-19, 09-30-19 Has received booster: COVID vaccine manufacturer: Julian      Date of COVID positive in last 90 days:  N/A  PCP - [name], FNP Cardiologist - Quay Burow, MD  Chest x-ray - N/A EKG - 09-28-20 Epic Stress Test - greater than 2 years ECHO - 01-17-20 Epic Cardiac Cath - N/A Pacemaker/ICD device last checked: Spinal Cord Stimulator:  Sleep Study - Yes, neg sleep apnea CPAP - No  Fasting Blood Sugar - 120 to 500  Checks Blood Sugar  - Checks weekly   Continuous glucose monitor on R arm Freestyle Libre  Blood Thinner Instructions: Aspirin Instructions: Last Dose:  Activity level:  Can go up a flight of stairs and perform activities of daily living without stopping and without symptoms of chest pain or shortness of breath.    Anesthesia review: CKD, HTN, PVD,  DM  Patient denies shortness of breath, fever, cough and chest pain at PAT appointment (completed over the phone)   Patient verbalized understanding of instructions that were given to them at the PAT appointment. Patient was also instructed that they will need to review over the PAT instructions again at home before surgery.

## 2021-02-28 NOTE — Patient Instructions (Addendum)
DUE TO COVID-19 ONLY ONE VISITOR IS ALLOWED TO COME WITH YOU AND STAY IN THE WAITING ROOM ONLY DURING PRE OP AND PROCEDURE.   **NO VISITORS ARE ALLOWED IN THE SHORT STAY AREA OR RECOVERY ROOM!!**        Your procedure is scheduled on:   Wednesday, 03-06-21   Report to [facility] Main  Entrance    Report to admitting at 10:45 AM   Call this number if you have problems the morning of surgery [phone]   Do not eat food :After Midnight.   May have liquids until 10:00 AM day of surgery  CLEAR LIQUID DIET  Foods Allowed                                                                     Foods Excluded  Water, Black Coffee (no milk/no creamer) and tea, regular and decaf                              liquids that you cannot  Plain Jell-O in any flavor  (No red)                         see through such as: Fruit ices (not with fruit pulp)                                 milk, soups, orange juice  Iced Popsicles (No red)                                    All solid food                             Apple juices Sports drinks like Gatorade (No red) Lightly seasoned clear broth or consume(fat free) Sugar   Complete one G2 drink the morning of surgery at 10:00 AM the day of surgery.       The day of surgery:  Drink ONE (1) Pre-Surgery G2 the morning of surgery. Drink in one sitting. Do not sip.  This drink was given to you during your hospital  pre-op appointment visit. Nothing else to drink after completing the Pre-Surgery G2.          If you have questions, please contact your surgeon's office.     Oral Hygiene is also important to reduce your risk of infection.                                    Remember - BRUSH YOUR TEETH THE MORNING OF SURGERY WITH YOUR REGULAR TOOTHPASTE   Do NOT smoke after Midnight  Take these medicines the morning of surgery with A SIP OF WATER:  Augmentin, Atorvastatin,  Hydralazine, Hydroxyzine, Tacrolimus  How to Manage Your  Diabetes Before and After Surgery  Why is it important to control my blood sugar before and after surgery? Improving blood sugar levels before and  after surgery helps healing and can limit problems. A way of improving blood sugar control is eating a healthy diet by:  Eating less sugar and carbohydrates  Increasing activity/exercise  Talking with your doctor about reaching your blood sugar goals High blood sugars (greater than 180 mg/dL) can raise your risk of infections and slow your recovery, so you will need to focus on controlling your diabetes during the weeks before surgery. Make sure that the doctor who takes care of your diabetes knows about your planned surgery including the date and location.  How do I manage my blood sugar before surgery? Check your blood sugar at least 4 times a day, starting 2 days before surgery, to make sure that the level is not too high or low. Check your blood sugar the morning of your surgery when you wake up and every 2 hours until you get to the Short Stay unit. If your blood sugar is less than 70 mg/dL, you will need to treat for low blood sugar: Do not take insulin. Treat a low blood sugar (less than 70 mg/dL) with  cup of clear juice (cranberry or apple), 4 glucose tablets, OR glucose gel. Recheck blood sugar in 15 minutes after treatment (to make sure it is greater than 70 mg/dL). If your blood sugar is not greater than 70 mg/dL on recheck, call [phone] for further instructions. Report your blood sugar to the short stay nurse when you get to Short Stay.  If you are admitted to the hospital after surgery: Your blood sugar will be checked by the staff and you will probably be given insulin after surgery (instead of oral diabetes medicines) to make sure you have good blood sugar levels. The goal for blood sugar control after surgery is 80-180 mg/dL.   WHAT DO I DO ABOUT MY DIABETES MEDICATION?  Do not take oral diabetes medicines (pills) the  morning of surgery.  THE NIGHT BEFORE SURGERY:   Take Glipizide as prescribed       THE MORNING OF SURGERY:  Do not take Glipizide or Novolin  Reviewed and Endorsed by [facility] Patient Education Committee, August 2015                     Stop all vitamins and herbal supplements a week before surgery             You may not have any metal on your body including jewelry, and body piercing             Do not wear  lotions, powders, cologne, or deodorant              Men may shave face and neck.  Do not bring valuables to the hospital. [place].   Contacts, dentures or bridgework may not be worn into surgery.   Patients discharged the day of surgery will not be allowed to drive home.  Please read over the following fact sheets you were given: IF YOU HAVE QUESTIONS ABOUT YOUR PRE OP INSTRUCTIONS PLEASE CALL [place] - Preparing for Surgery Before surgery, you can play an important role.  Because skin is not sterile, your skin needs to be as free of germs as possible.  You can reduce the number of germs on your skin by washing with CHG (chlorahexidine gluconate) soap before surgery.  CHG is an antiseptic cleaner which kills germs and bonds with the skin to continue killing  germs even after washing. Please DO NOT use if you have an allergy to CHG or antibacterial soaps.  If your skin becomes reddened/irritated stop using the CHG and inform your nurse when you arrive at Short Stay. Do not shave (including legs and underarms) for at least 48 hours prior to the first CHG shower.  You may shave your face/neck.  Please follow these instructions carefully:  1.  Shower with CHG Soap the night before surgery and the  morning of surgery.  2.  If you choose to wash your hair, wash your hair first as usual with your normal  shampoo.  3.  After you shampoo, rinse your hair and body thoroughly to remove the shampoo.                              4.  Use CHG as you would any other liquid soap.  You can apply chg directly to the skin and wash.  Gently with a scrungie or clean washcloth.  5.  Apply the CHG Soap to your body ONLY FROM THE NECK DOWN.   Do   not use on face/ open                           Wound or open sores. Avoid contact with eyes, ears mouth and   genitals (private parts).                       Wash face,  Genitals (private parts) with your normal soap.             6.  Wash thoroughly, paying special attention to the area where your    surgery  will be performed.  7.  Thoroughly rinse your body with warm water from the neck down.  8.  DO NOT shower/wash with your normal soap after using and rinsing off the CHG Soap.                9.  Pat yourself dry with a clean towel.            10.  Wear clean pajamas.            11.  Place clean sheets on your bed the night of your first shower and do not  sleep with pets. Day of Surgery : Do not apply any lotions/deodorants the morning of surgery.  Please wear clean clothes to the hospital/surgery center.  FAILURE TO FOLLOW THESE INSTRUCTIONS MAY RESULT IN THE CANCELLATION OF YOUR SURGERY  PATIENT SIGNATURE_________________________________  NURSE SIGNATURE__________________________________  ________________________________________________________________________

## 2021-03-01 ENCOUNTER — Ambulatory Visit (HOSPITAL_COMMUNITY): Payer: BC Managed Care – PPO

## 2021-03-01 LAB — CBC
Hematocrit: 47.5 % (ref 37.5–51.0)
Hemoglobin: 14.2 g/dL (ref 13.0–17.7)
MCH: 25.2 pg — ABNORMAL LOW (ref 26.6–33.0)
MCHC: 29.9 g/dL — ABNORMAL LOW (ref 31.5–35.7)
MCV: 84 fL (ref 79–97)
Platelets: 150 10*3/uL (ref 150–450)
RBC: 5.63 x10E6/uL (ref 4.14–5.80)
RDW: 12.2 % (ref 11.6–15.4)
WBC: 3.8 10*3/uL (ref 3.4–10.8)

## 2021-03-01 LAB — CMP14+EGFR
ALT: 13 IU/L (ref 0–44)
AST: 10 IU/L (ref 0–40)
Albumin/Globulin Ratio: 1.1 — ABNORMAL LOW (ref 1.2–2.2)
Albumin: 4 g/dL (ref 3.8–4.9)
Alkaline Phosphatase: 149 IU/L — ABNORMAL HIGH (ref 44–121)
BUN/Creatinine Ratio: 23 (ref 10–24)
BUN: 30 mg/dL — ABNORMAL HIGH (ref 8–27)
Bilirubin Total: 0.4 mg/dL (ref 0.0–1.2)
CO2: 22 mmol/L (ref 20–29)
Calcium: 10.3 mg/dL — ABNORMAL HIGH (ref 8.6–10.2)
Chloride: 97 mmol/L (ref 96–106)
Creatinine, Ser: 1.3 mg/dL — ABNORMAL HIGH (ref 0.76–1.27)
Globulin, Total: 3.6 g/dL (ref 1.5–4.5)
Glucose: 640 mg/dL (ref 70–99)
Potassium: 5.9 mmol/L — ABNORMAL HIGH (ref 3.5–5.2)
Sodium: 134 mmol/L (ref 134–144)
Total Protein: 7.6 g/dL (ref 6.0–8.5)
eGFR: 63 mL/min/{1.73_m2} (ref >59)

## 2021-03-01 LAB — HEMOGLOBIN A1C
Est. average glucose Bld gHb Est-mCnc: >398 mg/dL
Hgb A1c MFr Bld: >15.5 % — ABNORMAL HIGH (ref 4.8–5.6)

## 2021-03-03 NOTE — Progress Notes (Signed)
Subjective: 60 year old male presents the office today for follow-up of ulceration to his right foot and discussed MRI results.  He states his wife change the dressing and thinks that she puts too much cream on it otherwise he has been doing well.  No fevers or chills.   Last A1c 14.8 on 12/11/2020 Previously seen Dr. Alvester Chou for her circulation.  Objective: AAO x3, NAD DP/PT pulses palpable bilaterally, CRT less than 3 seconds Sensation decreased with Semmes Weinstein monofilament On the right foot submetatarsal 1 with hyperkeratotic tissue.  The wound is again somewhat larger.  It measures 2 x 1.4 x 0.6 cm.  Prior debridement was somewhat smaller 1.8 x 1.2 x 0.4 cm.  There is no probing to bone although it is close.  No undermining or tunneling.  Minimal edema.  There is no erythema or warmth.  No ascending cellulitis.  There is no fluctuation or crepitation.  No malodor. No pain with calf compression, erythema or warmth.  Assessment: 60 year old male, ulceration right foot; uncontrolled diabetes  Plan: -All treatment options discussed with the patient including all alternatives, risks, complications.  -Sharply debrided the wound utilize #312 with scalpel as well as a tissue nipper down to healthy, bleeding, viable tissue to remove nonviable, devitalized tissue in order to promote wound healing.  No significant blood loss.  Tolerated well.  -I reviewed the MRI findings with him.  Given the longevity of the wound as well as concern for osteomyelitis I recommended wound debridement, excision as well as bone biopsy.  He has been off of antibiotics for last couple days.  The plan on this next week so has been off of antibiotics at least a week prior to biopsy.  However he needs to monitor closely for signs or symptoms of infection and he is to call the office immediately should any occur.  Off of the antibiotics.  He is at risk of amputation.  He is well aware of this but wants to avoid that at this  time. -The incision placement as well as the postoperative course was discussed with the patient. I discussed risks of the surgery which include, but not limited to, infection, bleeding, pain, swelling, need for further surgery, delayed or nonhealing, painful or ugly scar, numbness or sensation changes, over/under correction, recurrence, transfer lesions, further deformity,  DVT/PE, loss of toe/foot. Patient understands these risks and wishes to proceed with surgery. The surgical consent was reviewed with the patient all 3 pages were signed. No promises or guarantees were given to the outcome of the procedure. All questions were answered to the best of my ability. Before the surgery the patient was encouraged to call the office if there is any further questions. The surgery will be performed at [facility] on an outpatient basis.  Trula Slade DPM

## 2021-03-05 ENCOUNTER — Other Ambulatory Visit: Payer: Self-pay

## 2021-03-05 ENCOUNTER — Ambulatory Visit (INDEPENDENT_AMBULATORY_CARE_PROVIDER_SITE_OTHER): Payer: BC Managed Care – PPO | Admitting: Endocrinology

## 2021-03-05 ENCOUNTER — Encounter (HOSPITAL_COMMUNITY): Payer: Self-pay

## 2021-03-05 ENCOUNTER — Encounter (HOSPITAL_COMMUNITY)
Admission: RE | Admit: 2021-03-05 | Discharge: 2021-03-05 | Disposition: A | Discharge status: Home / Self Care | Payer: BC Managed Care – PPO | Source: Ambulatory Visit | Attending: Podiatry | Admitting: Podiatry

## 2021-03-05 DIAGNOSIS — E1165 Type 2 diabetes mellitus with hyperglycemia: Secondary | ICD-10-CM | POA: Diagnosis not present

## 2021-03-05 DIAGNOSIS — E1122 Type 2 diabetes mellitus with diabetic chronic kidney disease: Secondary | ICD-10-CM | POA: Diagnosis not present

## 2021-03-05 DIAGNOSIS — N185 Chronic kidney disease, stage 5: Secondary | ICD-10-CM | POA: Diagnosis not present

## 2021-03-05 DIAGNOSIS — Z794 Long term (current) use of insulin: Secondary | ICD-10-CM

## 2021-03-05 LAB — POCT GLYCOSYLATED HEMOGLOBIN (HGB A1C): HbA1c POC (<> result, manual entry): >15 % (ref 4.0–5.6)

## 2021-03-05 MED ORDER — NOVOLIN N FLEXPEN 100 UNIT/ML ~~LOC~~ SUPN: 100.0000 [IU] | PEN_INJECTOR | SUBCUTANEOUS | 3 refills | Status: DC

## 2021-03-05 NOTE — Anesthesia Preprocedure Evaluation (Addendum)
Anesthesia Evaluation  Patient identified by MRN, date of birth, ID band Patient awake    Reviewed: Allergy & Precautions, NPO status , Patient's Chart, lab work & pertinent test results  History of Anesthesia Complications Negative for: history of anesthetic complications  Airway Mallampati: II  TM Distance: >3 FB Neck ROM: Full    Dental  (+) Dental Advisory Given, Teeth Intact   Pulmonary neg pulmonary ROS,    breath sounds clear to auscultation       Cardiovascular hypertension, Pt. 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  Lab Results      Component                Value               Date                      CREATININE               1.30 (H)            02/28/2021           Lab Results      Component                Value               Date                      K                        4.1                 03/06/2021                Musculoskeletal negative musculoskeletal ROS (+)   Abdominal   Peds  Hematology negative hematology ROS (+) Lab Results      Component                Value               Date                      WBC                      3.8                 02/28/2021                HGB  13.6                03/06/2021                HCT                      40.0                03/06/2021                MCV                      84                  02/28/2021                PLT                      150                 02/28/2021               Anesthesia Other Findings 60 y.o. never smoker with h/o HTN, CKD, DM II, diabetic ulcer right foot scheduled for above procedure 03/06/2021 with Dr. Celesta Gentile.   Pt with poorly controlled DM II.  He was seen by PCP 02/28/2021. Per OV note pt had been out of DM medications for 2 weeks.  Blood glucose 640 at this visit, A1C >15.  Medications refilled at this visit.  Referral made to endocrinologist.    Pt seen by endocrinology 03/05/2021. Continuous glucose monitor placed at this visit.  Insulin changed to NPH 100 units every morning.   Per PCP, "Addendum: 03/05/2021 - patient is to have surgery on his right foot for osteomyelitis and it is necessary for him to have this surgery regardless of his HgbA1c, will notify patient. He has also seen Endocrinology earlier this morning. I have completed the forms and faxed to [facility]."  Risk of cancellation DOS discussed.  Evaluate DOS.   Reproductive/Obstetrics                            Anesthesia Physical Anesthesia Plan  ASA: 3  Anesthesia Plan: MAC   Post-op Pain Management:    Induction: Intravenous  PONV Risk Score and Plan: 1 and Treatment may vary due to age or medical condition and Propofol infusion  Airway Management Planned: Nasal Cannula  Additional Equipment: None  Intra-op Plan:   Post-operative Plan:   Informed Consent: I have reviewed the patients History and Physical, chart, labs and discussed the procedure including the risks, benefits and alternatives for the proposed anesthesia with the patient or authorized representative who has indicated his/her understanding and acceptance.     Dental advisory given  Plan Discussed with: CRNA and Anesthesiologist  Anesthesia Plan Comments: (See PAT note 03/05/21, Konrad Felix Ward, PA-C)       Anesthesia Quick Evaluation

## 2021-03-05 NOTE — Progress Notes (Signed)
Anesthesia Chart Review   Case: [id number] Date/Time: 03/06/21 1245   Procedures:      DEBRIDEMENT WOUND (Right)     GRAFT APPLICATION (Right)     BONE BIOPSY (Right)   Anesthesia type: Choice   Pre-op diagnosis: DIABETIC ULCER RIGHT FOOT   Location: [facility] ROOM 07 / [facility]   Surgeons: Trula Slade, DPM       DISCUSSION:60 y.o. never smoker with h/o HTN, CKD, DM II, diabetic ulcer right foot scheduled for above procedure 03/06/2021 with Dr. Celesta Gentile.   Pt with poorly controlled DM II.  He was seen by PCP 02/28/2021. Per OV note pt had been out of DM medications for 2 weeks.  Blood glucose 640 at this visit, A1C >15.  Medications refilled at this visit.  Referral made to endocrinologist.    Pt seen by endocrinology 03/05/2021. Continuous glucose monitor placed at this visit.  Insulin changed to NPH 100 units every morning.   Per PCP, "Addendum: 03/05/2021 - patient is to have surgery on his right foot for osteomyelitis and it is necessary for him to have this surgery regardless of his HgbA1c, will notify patient. He has also seen Endocrinology earlier this morning. I have completed the forms and faxed to [facility]."  Risk of cancellation DOS discussed.  Evaluate DOS.    VS: Ht '5\' 9"'  (1.753 m)   Wt 91.6 kg   BMI 29.83 kg/m   PROVIDERS: [name], FNP is PCP   Renato Shin, MD is Endocrinologist  LABS: Labs reviewed: Acceptable for surgery. (all labs ordered are listed, but only abnormal results are displayed)  Labs Reviewed - No data to display   IMAGES:   EKG: 09/28/2020 Rate 59 bpm  Sinus bradycardia   CV: Echo 01/17/2020 1. Left ventricular ejection fraction, by estimation, is 55 to 60%. The  left ventricle has normal function. The left ventricle has no regional  wall motion abnormalities.   2. Right ventricular systolic function is normal. The right ventricular  size is normal.   3. Left atrial size was mildly dilated. No left atrial/left  atrial  appendage thrombus was detected. The LAA emptying velocity was 90 cm/s.   4. Right atrial size was mildly dilated.   5. The mitral valve is normal in structure. Trivial mitral valve  regurgitation. No evidence of mitral stenosis.   6. The aortic valve is tricuspid. Aortic valve regurgitation is not  visualized. No aortic stenosis is present.  Past Medical History:  Diagnosis Date   Chronic kidney disease    Awaiting transplant   Diabetes ([place])    Diabetes mellitus without complication ([place])    Hypertension    Renal disorder    Renal insufficiency     Past Surgical History:  Procedure Laterality Date   CENTRAL VENOUS CATHETER INSERTION Right 01/20/2020   Procedure: INSERTION CENTRAL LINE ADULT; Tunneled central line;  Surgeon: Virl Cagey, MD;  Location: [facility];  Service: General;  Laterality: Right;   COLONOSCOPY N/A 12/05/2014   ZHY:QMVHQION external and internal hemorrhoid/mild diverticulosis/11 polyps removed   ESOPHAGOGASTRODUODENOSCOPY N/A 12/05/2014   GEX:BMWU duodenitis   IR FLUORO GUIDE CV LINE RIGHT  03/01/2019   IR REMOVAL TUN CV CATH W/O FL  05/10/2019   IR REMOVAL TUN CV CATH W/O FL  02/29/2020   IR US GUIDE VASC ACCESS RIGHT  03/01/2019   KIDNEY TRANSPLANT  03/27/2015   Penile Pump Insertion     TEE WITHOUT CARDIOVERSION N/A 01/17/2020  Procedure: TRANSESOPHAGEAL ECHOCARDIOGRAM (TEE) WITH PROPOFOL;  Surgeon: Arnoldo Lenis, MD;  Location[facility];  Service: Endoscopy;  Laterality: N/A;    MEDICATIONS:  amoxicillin-clavulanate (AUGMENTIN) 875-125 MG tablet   atorvastatin (LIPITOR) 10 MG tablet   blood glucose meter kit and supplies KIT   Blood Glucose Monitoring Suppl (ACCU-CHEK GUIDE ME) w/Device KIT   brimonidine (ALPHAGAN) 0.2 % ophthalmic solution   cloNIDine (CATAPRES - DOSED IN MG/24 HR) 0.3 mg/24hr patch   Continuous Blood Gluc Receiver (FREESTYLE LIBRE 2 READER) DEVI   Continuous Blood Gluc Sensor (FREESTYLE LIBRE 2 SENSOR)  MISC   cyclobenzaprine (FLEXERIL) 5 MG tablet   dorzolamide-timolol (COSOPT) 22.3-6.8 MG/ML ophthalmic solution   doxazosin (CARDURA) 4 MG tablet   glipiZIDE (GLUCOTROL XL) 5 MG 24 hr tablet   glucose blood (ACCU-CHEK GUIDE) test strip   hydrALAZINE (APRESOLINE) 25 MG tablet   hydrOXYzine (ATARAX/VISTARIL) 25 MG tablet   Insulin NPH, Human,, Isophane, (NOVOLIN N FLEXPEN) 100 UNIT/ML Kiwkpen   latanoprost (XALATAN) 0.005 % ophthalmic solution   Multiple Vitamin (MULTIVITAMIN) capsule   mupirocin ointment (BACTROBAN) 2 %   silver sulfADIAZINE (SILVADENE) 1 % cream   sodium bicarbonate 650 MG tablet   tacrolimus (PROGRAF) 1 MG capsule   triamcinolone (KENALOG) 0.025 % ointment   Vitamin D, Ergocalciferol, (DRISDOL) 50000 units CAPS capsule   No current facility-administered medications for this encounter.    Konrad Felix Ward, PA-C [facility] [phone]

## 2021-03-05 NOTE — Progress Notes (Signed)
Subjective:    Patient ID: Raymond [name], male    DOB: 06-05-1960, 60 y.o.   MRN: [id number]  HPI wife provides some hx, due to pt's uncertainty about hx.  Pt is referred by [name], NP, for diabetes.  Pt states DM was dx'ed in 5397; it is complicated by ESRD (transplant 2016, PDR, PAD, and foot ulcer); he has been on insulin since 2018; pt says his diet is good, but exercise is not good; he has never had pancreatitis, pancreatic surgery, severe hypoglycemia or DKA.  He will have right foot wound debridement tomorrow.  He takes toujeo, 80 units qhs, and Novolog, 10 units 3 times a day (just before each meal).  He says cbg varies from 120-580.  There is no trend throughout the day.  Pt says he sometimes misses the insulin, and does not use continuous glucose monitor sensors; both due to cost.   Past Medical History:  Diagnosis Date   Chronic kidney disease    Awaiting transplant   Diabetes ([place])    Diabetes mellitus without complication ([place])    Hypertension    Renal disorder    Renal insufficiency     Past Surgical History:  Procedure Laterality Date   CENTRAL VENOUS CATHETER INSERTION Right 01/20/2020   Procedure: INSERTION CENTRAL LINE ADULT; Tunneled central line;  Surgeon: Virl Cagey, MD;  Location: [facility];  Service: General;  Laterality: Right;   COLONOSCOPY N/A 12/05/2014   QBH:[id number] external and internal hemorrhoid/mild diverticulosis/11 polyps removed   ESOPHAGOGASTRODUODENOSCOPY N/A 12/05/2014   IOX:BDZH duodenitis   IR FLUORO GUIDE CV LINE RIGHT  03/01/2019   IR REMOVAL TUN CV CATH W/O FL  05/10/2019   IR REMOVAL TUN CV CATH W/O FL  02/29/2020   IR US GUIDE VASC ACCESS RIGHT  03/01/2019   KIDNEY TRANSPLANT  03/27/2015   Penile Pump Insertion     TEE WITHOUT CARDIOVERSION N/A 01/17/2020   Procedure: TRANSESOPHAGEAL ECHOCARDIOGRAM (TEE) WITH PROPOFOL;  Surgeon: Arnoldo Lenis, MD;  [facility];  Service: Endoscopy;  Laterality: N/A;     Social History   Socioeconomic History   Marital status: Married    Spouse name: Not on file   Number of children: Not on file   Years of education: Not on file   Highest education level: Not on file  Occupational History   Not on file  Tobacco Use   Smoking status: Never   Smokeless tobacco: Never  Vaping Use   Vaping Use: Never used  Substance and Sexual Activity   Alcohol use: No    Alcohol/week: 0.0 standard drinks   Drug use: No   Sexual activity: Not on file  Other Topics Concern   Not on file  Social History Narrative   ** Merged History Encounter **       Social Determinants of Health   Financial Resource Strain: Not on file  Food Insecurity: Not on file  Transportation Needs: Not on file  Physical Activity: Not on file  Stress: Not on file  Social Connections: Not on file  Intimate Partner Violence: Not on file    Current Outpatient Medications on File Prior to Visit  Medication Sig Dispense Refill   atorvastatin (LIPITOR) 10 MG tablet Take 1 tablet (10 mg total) by mouth daily. 90 tablet 3   blood glucose meter kit and supplies KIT Dispense based on patient and insurance preference. Use up to four times daily as directed. (FOR ICD-9 250.00, 250.01). 1 each  0   Blood Glucose Monitoring Suppl (ACCU-CHEK GUIDE ME) w/Device KIT 1 Piece by Does not apply route as directed. 1 kit 0   brimonidine (ALPHAGAN) 0.2 % ophthalmic solution Apply to eye.     cloNIDine (CATAPRES - DOSED IN MG/24 HR) 0.3 mg/24hr patch Place 0.3 mg onto the skin once a week.     Continuous Blood Gluc Receiver (FREESTYLE LIBRE 2 READER) DEVI As directed 1 each 0   Continuous Blood Gluc Sensor (FREESTYLE LIBRE 2 SENSOR) MISC 1 Piece by Does not apply route every 14 (fourteen) days. 2 each 3   cyclobenzaprine (FLEXERIL) 5 MG tablet Take 2 tablets (10 mg total) by mouth 3 (three) times daily as needed for muscle spasms. 12 tablet 0   dorzolamide-timolol (COSOPT) 22.3-6.8 MG/ML ophthalmic  solution Apply to eye.     doxazosin (CARDURA) 4 MG tablet Take 6 mg by mouth daily.      doxycycline (VIBRAMYCIN) 100 MG capsule Take 1 capsule (100 mg total) by mouth 2 (two) times daily. 20 capsule 0   glipiZIDE (GLUCOTROL XL) 5 MG 24 hr tablet Take 5 mg by mouth daily.      glucose blood (ACCU-CHEK GUIDE) test strip Use as instructed 4 x daily. E11.65 150 each 5   hydrALAZINE (APRESOLINE) 25 MG tablet Take 25 mg by mouth 2 (two) times daily.     HYDROcodone-acetaminophen (NORCO/VICODIN) 5-325 MG tablet Take 1 tablet by mouth every 6 (six) hours as needed. 10 tablet 0   hydrOXYzine (ATARAX/VISTARIL) 25 MG tablet Take 25 mg by mouth 3 (three) times daily as needed.     latanoprost (XALATAN) 0.005 % ophthalmic solution Apply to eye.     Multiple Vitamin (MULTIVITAMIN) capsule Take 1 capsule by mouth daily.     mupirocin ointment (BACTROBAN) 2 % Apply 1 application topically 2 (two) times daily. 30 g 2   silver sulfADIAZINE (SILVADENE) 1 % cream Apply 1 application topically daily. 50 g 0   sodium bicarbonate 650 MG tablet Take 1,300 mg by mouth 3 (three) times daily.     tacrolimus (PROGRAF) 1 MG capsule Take 4 mg by mouth 2 (two) times daily. Take 3 capsules (3mg) in AM & take 3 caps (3mg) in PM     triamcinolone (KENALOG) 0.025 % ointment Apply 1 application topically 3 (three) times daily.     Vitamin D, Ergocalciferol, (DRISDOL) 50000 units CAPS capsule Take 50,000 Units by mouth every 30 (thirty) days.     No current facility-administered medications on file prior to visit.    No Known Allergies  Family History  Problem Relation Age of Onset   Diabetes Mother    Heart disease Father    Colon cancer Neg Hx     There were no vitals taken for this visit.  Review of Systems denies sob, hypoglycemia, and n/v.  He has mild memory loss.  He has lost 30 lbs x 3 mos.       Objective:   Physical Exam Pulses: dorsalis pedis absent bilat.   MSK: no deformity of the feet.   CV: trace  bilat leg edema.   Skin:  no ulcer on the feet.  normal color and temp on the feet.  Right forefoot is bandaged.   Neuro: sensation is intact to touch on the feet.    Lab Results  Component Value Date   HGBA1C >15.5 (H) 02/28/2021   Lab Results  Component Value Date   CREATININE 1.30 (H) 02/28/2021   BUN   30 (H) 02/28/2021   NA 134 02/28/2021   K 5.9 (H) 02/28/2021   CL 97 02/28/2021   CO2 22 02/28/2021   Lab Results  Component Value Date   HGBA1C >15 03/05/2021      Assessment & Plan:  Insulin-requiring type 2 DM: severe exacerbation.  There is a possibility of hypoglycemia due to improved compliance with new regimen.   Patient Instructions  good diet and exercise significantly improve the control of your diabetes.  please let me know if you wish to be referred to a dietician.  high blood sugar is very risky to your health.  you should see an eye doctor and dentist every year.  It is very important to get all recommended vaccinations.  Controlling your blood pressure and cholesterol drastically reduces the damage diabetes does to your body.  Those who smoke should quit.  Please discuss these with your doctor.  check your blood sugar twice a day.  vary the time of day when you check, between before the 3 meals, and at bedtime.  also check if you have symptoms of your blood sugar being too high or too low.  please keep a record of the readings and bring it to your next appointment here (or you can bring the meter itself).  You can write it on any piece of paper.  please call us sooner if your blood sugar goes below 70, or if most of your readings are over 200. I have sent a prescription to your pharmacy, to change both insulins to NPH, 100 units every morning. On this type of insulin schedule, you should eat meals on a regular schedule.  If a meal is missed or significantly delayed, your blood sugar could go low.   We are placing a continuous glucose monitor today.  Despite this, you  should carefully check you blood sugar at home, to make sure it does not go low.   Please come back for a follow-up appointment in 2 weeks.

## 2021-03-05 NOTE — Patient Instructions (Addendum)
good diet and exercise significantly improve the control of your diabetes.  please let me know if you wish to be referred to a dietician.  high blood sugar is very risky to your health.  you should see an eye doctor and dentist every year.  It is very important to get all recommended vaccinations.  Controlling your blood pressure and cholesterol drastically reduces the damage diabetes does to your body.  Those who smoke should quit.  Please discuss these with your doctor.  check your blood sugar twice a day.  vary the time of day when you check, between before the 3 meals, and at bedtime.  also check if you have symptoms of your blood sugar being too high or too low.  please keep a record of the readings and bring it to your next appointment here (or you can bring the meter itself).  You can write it on any piece of paper.  please call us sooner if your blood sugar goes below 70, or if most of your readings are over 200. I have sent a prescription to your pharmacy, to change both insulins to NPH, 100 units every morning. On this type of insulin schedule, you should eat meals on a regular schedule.  If a meal is missed or significantly delayed, your blood sugar could go low.   We are placing a continuous glucose monitor today.  Despite this, you should carefully check you blood sugar at home, to make sure it does not go low.   Please come back for a follow-up appointment in 2 weeks.

## 2021-03-06 ENCOUNTER — Ambulatory Visit (HOSPITAL_COMMUNITY): Payer: BC Managed Care – PPO | Admitting: Physician Assistant

## 2021-03-06 ENCOUNTER — Encounter (HOSPITAL_COMMUNITY)
Admission: RE | Disposition: A | Discharge status: Home / Self Care | Payer: Self-pay | Source: Home / Self Care | Attending: Podiatry

## 2021-03-06 ENCOUNTER — Encounter: Payer: Self-pay | Admitting: Podiatry

## 2021-03-06 ENCOUNTER — Encounter (HOSPITAL_COMMUNITY): Payer: Self-pay | Admitting: Podiatry

## 2021-03-06 ENCOUNTER — Encounter: Payer: Self-pay | Admitting: Nurse Practitioner

## 2021-03-06 ENCOUNTER — Ambulatory Visit (HOSPITAL_COMMUNITY): Payer: BC Managed Care – PPO

## 2021-03-06 ENCOUNTER — Ambulatory Visit (HOSPITAL_COMMUNITY)
Admission: RE | Admit: 2021-03-06 | Discharge: 2021-03-06 | Disposition: A | Discharge status: Home / Self Care | Payer: BC Managed Care – PPO | Attending: Podiatry | Admitting: Podiatry

## 2021-03-06 DIAGNOSIS — Z794 Long term (current) use of insulin: Secondary | ICD-10-CM | POA: Insufficient documentation

## 2021-03-06 DIAGNOSIS — E11621 Type 2 diabetes mellitus with foot ulcer: Secondary | ICD-10-CM | POA: Insufficient documentation

## 2021-03-06 DIAGNOSIS — Z9889 Other specified postprocedural states: Secondary | ICD-10-CM

## 2021-03-06 DIAGNOSIS — M86671 Other chronic osteomyelitis, right ankle and foot: Secondary | ICD-10-CM | POA: Diagnosis not present

## 2021-03-06 DIAGNOSIS — L97519 Non-pressure chronic ulcer of other part of right foot with unspecified severity: Secondary | ICD-10-CM | POA: Diagnosis present

## 2021-03-06 DIAGNOSIS — L97512 Non-pressure chronic ulcer of other part of right foot with fat layer exposed: Secondary | ICD-10-CM | POA: Diagnosis not present

## 2021-03-06 HISTORY — PX: BONE BIOPSY: SHX375

## 2021-03-06 HISTORY — PX: GRAFT APPLICATION: SHX6696

## 2021-03-06 HISTORY — PX: WOUND DEBRIDEMENT: SHX247

## 2021-03-06 LAB — POCT I-STAT EG7
Acid-base deficit: 1 mmol/L (ref 0.0–2.0)
Bicarbonate: 25.5 mmol/L (ref 20.0–28.0)
Calcium, Ion: 1.35 mmol/L (ref 1.15–1.40)
HCT: 40 % (ref 39.0–52.0)
Hemoglobin: 13.6 g/dL (ref 13.0–17.0)
O2 Saturation: 40 %
Potassium: 4.1 mmol/L (ref 3.5–5.1)
Sodium: 144 mmol/L (ref 135–145)
TCO2: 27 mmol/L (ref 22–32)
pCO2, Ven: 48.9 mmHg (ref 44.0–60.0)
pH, Ven: 7.326 (ref 7.250–7.430)
pO2, Ven: 25 mmHg — CL (ref 32.0–45.0)

## 2021-03-06 LAB — GLUCOSE, CAPILLARY
Glucose-Capillary: 178 mg/dL — ABNORMAL HIGH (ref 70–99)
Glucose-Capillary: 56 mg/dL — ABNORMAL LOW (ref 70–99)
Glucose-Capillary: 58 mg/dL — ABNORMAL LOW (ref 70–99)
Glucose-Capillary: 130 mg/dL — ABNORMAL HIGH (ref 70–99)
Glucose-Capillary: 195 mg/dL — ABNORMAL HIGH (ref 70–99)

## 2021-03-06 LAB — SURGICAL PCR SCREEN
MRSA, PCR: NEGATIVE
Staphylococcus aureus: NEGATIVE

## 2021-03-06 SURGERY — DEBRIDEMENT, WOUND
Anesthesia: Monitor Anesthesia Care | Laterality: Right

## 2021-03-06 MED ORDER — LACTATED RINGERS IV SOLN: INTRAVENOUS | Status: DC

## 2021-03-06 MED ORDER — LIDOCAINE HCL 2 % IJ SOLN
INTRAMUSCULAR | Status: AC
  Filled 2021-03-06: qty 20

## 2021-03-06 MED ORDER — HYDROCODONE-ACETAMINOPHEN 5-325 MG PO TABS: 1.0000 | ORAL_TABLET | Freq: Four times a day (QID) | ORAL | 0 refills | Status: DC | PRN

## 2021-03-06 MED ORDER — BUPIVACAINE HCL (PF) 0.5 % IJ SOLN
INTRAMUSCULAR | Status: DC | PRN
  Administered 2021-03-06: 5 mL

## 2021-03-06 MED ORDER — BUPIVACAINE HCL (PF) 0.5 % IJ SOLN
INTRAMUSCULAR | Status: AC
  Filled 2021-03-06: qty 30

## 2021-03-06 MED ORDER — 0.9 % SODIUM CHLORIDE (POUR BTL) OPTIME
TOPICAL | Status: DC | PRN
  Administered 2021-03-06: 1000 mL

## 2021-03-06 MED ORDER — PROPOFOL 10 MG/ML IV BOLUS
INTRAVENOUS | Status: AC
  Filled 2021-03-06: qty 20

## 2021-03-06 MED ORDER — PROPOFOL 10 MG/ML IV BOLUS
INTRAVENOUS | Status: DC | PRN
Start: 2021-03-06 — End: 2021-03-06
  Administered 2021-03-06: 20 mg via INTRAVENOUS

## 2021-03-06 MED ORDER — MIDAZOLAM HCL 2 MG/2ML IJ SOLN
INTRAMUSCULAR | Status: DC | PRN
  Administered 2021-03-06 (×2): 1 mg via INTRAVENOUS

## 2021-03-06 MED ORDER — LIDOCAINE HCL (PF) 2 % IJ SOLN
INTRAMUSCULAR | Status: AC
  Filled 2021-03-06: qty 20

## 2021-03-06 MED ORDER — CEFAZOLIN SODIUM-DEXTROSE 2-3 GM-%(50ML) IV SOLR
INTRAVENOUS | Status: DC | PRN
  Administered 2021-03-06: 2 g via INTRAVENOUS

## 2021-03-06 MED ORDER — DOXYCYCLINE HYCLATE 100 MG PO CAPS: 100.0000 mg | ORAL_CAPSULE | Freq: Two times a day (BID) | ORAL | 0 refills | Status: DC

## 2021-03-06 MED ORDER — LIDOCAINE HCL 1 % IJ SOLN
INTRAMUSCULAR | Status: DC | PRN
  Administered 2021-03-06: 5 mL

## 2021-03-06 MED ORDER — FENTANYL CITRATE (PF) 100 MCG/2ML IJ SOLN
INTRAMUSCULAR | Status: DC | PRN
  Administered 2021-03-06: 25 ug via INTRAVENOUS

## 2021-03-06 MED ORDER — CHLORHEXIDINE GLUCONATE CLOTH 2 % EX PADS: 6.0000 | MEDICATED_PAD | Freq: Once | CUTANEOUS | Status: DC

## 2021-03-06 MED ORDER — SODIUM CHLORIDE (PF) 0.9 % IJ SOLN
INTRAMUSCULAR | Status: AC
  Filled 2021-03-06: qty 10

## 2021-03-06 MED ORDER — ORAL CARE MOUTH RINSE: 15.0000 mL | Freq: Once | OROMUCOSAL | Status: AC

## 2021-03-06 MED ORDER — CEFAZOLIN SODIUM 1 G IJ SOLR
INTRAMUSCULAR | Status: AC
  Filled 2021-03-06: qty 20

## 2021-03-06 MED ORDER — LIDOCAINE-EPINEPHRINE 1 %-1:100000 IJ SOLN
INTRAMUSCULAR | Status: AC
  Filled 2021-03-06: qty 1

## 2021-03-06 MED ORDER — ONDANSETRON HCL 4 MG/2ML IJ SOLN
INTRAMUSCULAR | Status: AC
  Filled 2021-03-06: qty 2

## 2021-03-06 MED ORDER — LIDOCAINE 2% (20 MG/ML) 5 ML SYRINGE
INTRAMUSCULAR | Status: DC | PRN
  Administered 2021-03-06: 40 mg via INTRAVENOUS

## 2021-03-06 MED ORDER — ONDANSETRON HCL 4 MG/2ML IJ SOLN
INTRAMUSCULAR | Status: DC | PRN
  Administered 2021-03-06: 4 mg via INTRAVENOUS

## 2021-03-06 MED ORDER — PROPOFOL 500 MG/50ML IV EMUL
INTRAVENOUS | Status: DC | PRN
  Administered 2021-03-06: 75 ug/kg/min via INTRAVENOUS
  Administered 2021-03-06: 50 ug/kg/min via INTRAVENOUS

## 2021-03-06 MED ORDER — FENTANYL CITRATE (PF) 100 MCG/2ML IJ SOLN
INTRAMUSCULAR | Status: AC
  Filled 2021-03-06: qty 2

## 2021-03-06 MED ORDER — CHLORHEXIDINE GLUCONATE 0.12 % MT SOLN
15.0000 mL | Freq: Once | OROMUCOSAL | Status: AC
  Administered 2021-03-06: 15 mL via OROMUCOSAL

## 2021-03-06 MED ORDER — MIDAZOLAM HCL 2 MG/2ML IJ SOLN
INTRAMUSCULAR | Status: AC
  Filled 2021-03-06: qty 2

## 2021-03-06 SURGICAL SUPPLY — 59 items
BAG COUNTER SPONGE SURGICOUNT (BAG) IMPLANT
BLADE HEX COATED 2.75 (ELECTRODE) ×2 IMPLANT
BLADE OSCILLATING/SAGITTAL (BLADE) ×1
BLADE SURG 15 STRL LF DISP TIS (BLADE) ×2 IMPLANT
BLADE SURG 15 STRL SS (BLADE) ×2
BLADE SW THK.38XMED LNG THN (BLADE) ×1 IMPLANT
BNDG CONFORM 2 STRL LF (GAUZE/BANDAGES/DRESSINGS) ×2 IMPLANT
BNDG ELASTIC 3X5.8 VLCR STR LF (GAUZE/BANDAGES/DRESSINGS) ×2 IMPLANT
BNDG ELASTIC 4X5.8 VLCR STR LF (GAUZE/BANDAGES/DRESSINGS) ×2 IMPLANT
BNDG ELASTIC 6X5.8 VLCR STR LF (GAUZE/BANDAGES/DRESSINGS) ×2 IMPLANT
BNDG ESMARK 4X9 LF (GAUZE/BANDAGES/DRESSINGS) ×2 IMPLANT
BNDG GAUZE ELAST 4 BULKY (GAUZE/BANDAGES/DRESSINGS) ×2 IMPLANT
BUR EGG ELITE 4.0 (BURR) ×2 IMPLANT
COVER BACK TABLE 60X90IN (DRAPES) ×2 IMPLANT
CUFF TOURN SGL QUICK 18X4 (TOURNIQUET CUFF) IMPLANT
DRAPE EXTREMITY T 121X128X90 (DISPOSABLE) ×2 IMPLANT
DRAPE IMP U-DRAPE 54X76 (DRAPES) ×2 IMPLANT
DRAPE OEC MINIVIEW 54X84 (DRAPES) ×2 IMPLANT
DRSG ADAPTIC 3X8 NADH LF (GAUZE/BANDAGES/DRESSINGS) ×2 IMPLANT
DRSG EMULSION OIL 3X3 NADH (GAUZE/BANDAGES/DRESSINGS) ×2 IMPLANT
DRSG PAD ABDOMINAL 8X10 ST (GAUZE/BANDAGES/DRESSINGS) ×2 IMPLANT
DURAPREP 26ML APPLICATOR (WOUND CARE) IMPLANT
ELECT REM PT RETURN 15FT ADLT (MISCELLANEOUS) ×2 IMPLANT
GAUZE 4X4 16PLY ~~LOC~~+RFID DBL (SPONGE) IMPLANT
GAUZE SPONGE 4X4 12PLY STRL (GAUZE/BANDAGES/DRESSINGS) ×2 IMPLANT
GLOVE SURG ENC MOIS LTX SZ7.5 (GLOVE) ×4 IMPLANT
GLOVE SURG NEOP MICRO LF SZ7.5 (GLOVE) ×2 IMPLANT
GLOVE SURG UNDER POLY LF SZ7.5 (GLOVE) ×2 IMPLANT
GOWN STRL REUS W/ TWL LRG LVL3 (GOWN DISPOSABLE) ×1 IMPLANT
GOWN STRL REUS W/ TWL XL LVL3 (GOWN DISPOSABLE) ×1 IMPLANT
GOWN STRL REUS W/TWL LRG LVL3 (GOWN DISPOSABLE) ×1
GOWN STRL REUS W/TWL XL LVL3 (GOWN DISPOSABLE) ×1
KIT BASIN OR (CUSTOM PROCEDURE TRAY) ×2 IMPLANT
MATRIX WOUND 2-LAYER 7X10CM (Tissue) ×2 IMPLANT
MICROMATRIX 1000MG (Tissue) ×2 IMPLANT
NDL SAFETY ECLIPSE 18X1.5 (NEEDLE) IMPLANT
NEEDLE BIOPSY JAMSHIDI 11X6 (NEEDLE) ×2 IMPLANT
NEEDLE HYPO 18GX1.5 SHARP (NEEDLE)
NEEDLE HYPO 25X1 1.5 SAFETY (NEEDLE) ×4 IMPLANT
NS IRRIG 1000ML POUR BTL (IV SOLUTION) IMPLANT
PADDING CAST ABS 4INX4YD NS (CAST SUPPLIES) ×1
PADDING CAST ABS COTTON 4X4 ST (CAST SUPPLIES) ×1 IMPLANT
PENCIL SMOKE EVACUATOR (MISCELLANEOUS) ×2 IMPLANT
SOLUTION PARTIC MCRMTRX 1000MG (Tissue) ×1 IMPLANT
STOCKINETTE 6  STRL (DRAPES) ×1
STOCKINETTE 6 STRL (DRAPES) ×1 IMPLANT
STRIP SUTURE WOUND CLOSURE 1/2 (MISCELLANEOUS) IMPLANT
SUT ETHILON 3 0 PS 1 (SUTURE) ×2 IMPLANT
SUT ETHILON 4 0 PS 2 18 (SUTURE) ×2 IMPLANT
SUT MNCRL AB 3-0 PS2 18 (SUTURE) ×2 IMPLANT
SUT MNCRL AB 4-0 PS2 18 (SUTURE) IMPLANT
SUT MON AB 5-0 PS2 18 (SUTURE) IMPLANT
SUT PROLENE 2 0 SH DA (SUTURE) ×2 IMPLANT
SUT PROLENE 3 0 PS 2 (SUTURE) ×2 IMPLANT
SYR 10ML LL (SYRINGE) IMPLANT
SYR BULB EAR ULCER 3OZ GRN STR (SYRINGE) ×2 IMPLANT
SYR CONTROL 10ML LL (SYRINGE) ×4 IMPLANT
TUBING CONNECTING 10 (TUBING) ×2 IMPLANT
UNDERPAD 30X36 HEAVY ABSORB (UNDERPADS AND DIAPERS) ×2 IMPLANT

## 2021-03-06 NOTE — Anesthesia Procedure Notes (Signed)
Procedure Name: MAC Date/Time: 03/06/2021 1:33 PM Performed by: Eben Burow, CRNA Pre-anesthesia Checklist: Patient identified, Emergency Drugs available, Suction available, Patient being monitored and Timeout performed Oxygen Delivery Method: Simple face mask Placement Confirmation: positive ETCO2

## 2021-03-06 NOTE — Op Note (Signed)
PATIENT:  Raymond [name]  60 y.o. male   PRE-OPERATIVE DIAGNOSIS:  DIABETIC ULCER RIGHT FOOT   POST-OPERATIVE DIAGNOSIS:  DIABETIC ULCER RIGHT FOOT   PROCEDURE:  Procedure(s): DEBRIDEMENT WOUND (Right) GRAFT APPLICATION (Right) BONE BIOPSY (Right)   SURGEON:  Surgeon(s) and Role:    Trula Slade, DPM - Primary   PHYSICIAN ASSISTANT:    ASSISTANTS: none    ANESTHESIA:   none   EBL:  10 mL    BLOOD ADMINISTERED:none   DRAINS: none    LOCAL MEDICATIONS USED:  OTHER 10 cc lidocaine and marcaine plain   SPECIMEN:  Source of Specimen:  1st metatarsal and phalanx for culture and patholgoy   DISPOSITION OF SPECIMEN:  PATHOLOGY   COUNTS:  YES   TOURNIQUET:  * No tourniquets in log *   DICTATION: .Dragon Dictation   PLAN OF CARE: Discharge to home after PACU   PATIENT DISPOSITION:  PACU - hemodynamically stable.   Delay start of Pharmacological VTE agent (>24hrs) due to surgical blood loss or risk of bleeding: no  Indications for surgery: 60 year old male has longstanding history of an ulcer to the right submetatarsal 1.  MRI was performed with concern for possible osteomyelitis.  Due to this I discussed with him wound debridement, bone biopsy and possible graft application.  Patient states that he does not want proceed with amputation at this time.  We discussed alternatives risks complications.  No promises or guarantees given aspect of the procedure and all questions were into the best my ability.  Procedure in detail: The patient was with verbally and visually identified by myself, the nursing staff, the anesthesia staff preoperatively.  He was then transferred to the operating room via stretcher where the surgery took place.  After an adequate plane of anesthesia was obtained a timeout was performed and 10 cc of lidocaine, Marcaine plain was infiltrated in a regional block fashion.  The right lower extremities and scrubbed, prepped, draped in normal sterile  fashion.  Secondary timeout was performed.  At this time I made a small stab incision on the first metatarsal as well as proximal phalanx.  This was away from the area of the wound.  I utilized a Jamshidi needle in order to remove a piece of bone from each site.  I sent a piece of bone from each site for both culture and pathology.  No purulence noted.  I irrigated the wound and incisions were closed with a single nylon suture.  Attention was directed to the area of the ulceration.  Prior to wound debridement the wound measured 2.1 x 1.5 x 0.9 cm.  The less than 15 blade scalpel to surgically excise the ulceration was also debrided any nonviable devitalized tissue.  There is no purulence noted during the wound.  After debridement there is no undermining or tunneling it was found to the level of the tendon.  There is no exposed bone.  The ulcer was copiously irrigated with saline and hemostasis was achieved.  Post wound measurements were 2.6 x 2 x 1.1 cm. I think applied 1000mg  of MicroMatrix to the wound and then applied Cytal. I had to use a 7 x 10 cm graft and I secured in place with staples.  I cut the excess.  I utilized a 3 x 3 centimeters portion of graft and the rest was wastage.  I secured this with staples and applied Adaptic secured with staples and then petroleum jelly and a dry sterile dressing.  At this  time he was able to clean incision found to tolerate the procedure well and complications.  Stretcher to PACU vital signs stable vascular status intact.

## 2021-03-06 NOTE — Transfer of Care (Signed)
Immediate Anesthesia Transfer of Care Note  Patient: Raymond [name]  Procedure(s) Performed: DEBRIDEMENT WOUND (Right) GRAFT APPLICATION (Right) BONE BIOPSY (Right)  Patient Location: PACU  Anesthesia Type:MAC  Level of Consciousness: awake, alert  and patient cooperative  Airway & Oxygen Therapy: Patient Spontanous Breathing and Patient connected to face mask oxygen  Post-op Assessment: Report given to RN and Post -op Vital signs reviewed and stable  Post vital signs: Reviewed and stable  Last Vitals:  Vitals Value Taken Time  BP 150/68 03/06/21 1426  Temp    Pulse 56 03/06/21 1430  Resp 16 03/06/21 1430  SpO2 99 % 03/06/21 1430  Vitals shown include unvalidated device data.  Last Pain:  Vitals:   03/06/21 1121  TempSrc:   PainSc: 0-No pain         Complications: No notable events documented.

## 2021-03-06 NOTE — Progress Notes (Signed)
Seen in pre-op today for surgery on the right foot for bone biopsy, ulcer debridement, possible graft application. Discussed the surgery and post-op course. All alternatives, risks, complications discussed. Consent signed. NPO confirmed. Proceed as scheduled.

## 2021-03-06 NOTE — H&P (View-Only) (Signed)
Seen in pre-op today for surgery on the right foot for bone biopsy, ulcer debridement, possible graft application. Discussed the surgery and post-op course. All alternatives, risks, complications discussed. Consent signed. NPO confirmed. Proceed as scheduled.

## 2021-03-06 NOTE — Brief Op Note (Signed)
03/06/2021  2:43 PM  PATIENT:  Raymond [name]  60 y.o. male  PRE-OPERATIVE DIAGNOSIS:  DIABETIC ULCER RIGHT FOOT  POST-OPERATIVE DIAGNOSIS:  DIABETIC ULCER RIGHT FOOT  PROCEDURE:  Procedure(s): DEBRIDEMENT WOUND (Right) GRAFT APPLICATION (Right) BONE BIOPSY (Right)  SURGEON:  Surgeon(s) and Role:    Trula Slade, DPM - Primary  PHYSICIAN ASSISTANT:   ASSISTANTS: none   ANESTHESIA:   none  EBL:  10 mL   BLOOD ADMINISTERED:none  DRAINS: none   LOCAL MEDICATIONS USED:  OTHER 10 cc lidocaine and marcaine plain  SPECIMEN:  Source of Specimen:  1st metatarsal and phalanx for culture and patholgoy  DISPOSITION OF SPECIMEN:  PATHOLOGY  COUNTS:  YES  TOURNIQUET:  * No tourniquets in log *  DICTATION: .Dragon Dictation  PLAN OF CARE: Discharge to home after PACU  PATIENT DISPOSITION:  PACU - hemodynamically stable.   Delay start of Pharmacological VTE agent (>24hrs) due to surgical blood loss or risk of bleeding: no

## 2021-03-06 NOTE — Interval H&P Note (Signed)
History and Physical Interval Note:  03/06/2021 1:13 PM  Raymond [name]  has presented today for surgery, with the diagnosis of DIABETIC ULCER RIGHT FOOT.  The various methods of treatment have been discussed with the patient and family. After consideration of risks, benefits and other options for treatment, the patient has consented to  Procedure(s): DEBRIDEMENT WOUND (Right) GRAFT APPLICATION (Right) BONE BIOPSY (Right) as a surgical intervention.  The patient's history has been reviewed, patient examined, no change in status, stable for surgery.  I have reviewed the patient's chart and labs.  Questions were answered to the patient's satisfaction.     Trula Slade

## 2021-03-06 NOTE — Discharge Instructions (Addendum)
See written instructions Wear wedge shoe and limit the amount of time you are on your foot.  Keep foot elevated  Do not change the bandage

## 2021-03-06 NOTE — Progress Notes (Signed)
Orthopedic Tech Progress Note Patient Details:  [name] [birth year] [id number]  Patient ID: Raymond [name], male   DOB: 1960-06-10, 60 y.o.   MRN: [id number]  Kennis Carina 03/06/2021, 2:44 PM Right darco shoe applied in pacu

## 2021-03-08 ENCOUNTER — Telehealth: Payer: Self-pay | Admitting: *Deleted

## 2021-03-08 NOTE — Telephone Encounter (Signed)
Returned call to patient and gave information to patient per Dr Jacqualyn Posey that so far everything is looking good but will have to wait a few more days for the preliminary report which will be back next week, will call. He verbalized understanding.

## 2021-03-08 NOTE — Telephone Encounter (Signed)
Called and spoke with the patient and stated that I was calling to see how the patient was doing after having surgery with Dr Jacqualyn Posey and patient stated that he was doing ok and that he would need to get some supplies and I stated to be sure and not be on it more than 15 minutes on the hour and to ice and elevate and that if patient had any concerns or questions to call the office or could go to my chart. Raymond [name]

## 2021-03-08 NOTE — Telephone Encounter (Signed)
Patient is calling for the bone biopsy status report, not visible in epic  as of yet.Please advise.

## 2021-03-10 LAB — SURGICAL PATHOLOGY

## 2021-03-11 ENCOUNTER — Other Ambulatory Visit: Payer: Self-pay

## 2021-03-11 ENCOUNTER — Ambulatory Visit (INDEPENDENT_AMBULATORY_CARE_PROVIDER_SITE_OTHER): Payer: BC Managed Care – PPO

## 2021-03-11 ENCOUNTER — Encounter: Payer: Self-pay | Admitting: Podiatry

## 2021-03-11 ENCOUNTER — Ambulatory Visit (INDEPENDENT_AMBULATORY_CARE_PROVIDER_SITE_OTHER): Payer: BC Managed Care – PPO | Admitting: Podiatry

## 2021-03-11 VITALS — Temp 97.7°F

## 2021-03-11 DIAGNOSIS — M8600 Acute hematogenous osteomyelitis, unspecified site: Secondary | ICD-10-CM

## 2021-03-11 LAB — AEROBIC/ANAEROBIC CULTURE W GRAM STAIN (SURGICAL/DEEP WOUND)
Culture: NO GROWTH
Culture: NORMAL
Gram Stain: NONE SEEN
Gram Stain: NONE SEEN

## 2021-03-12 ENCOUNTER — Telehealth: Payer: Self-pay | Admitting: *Deleted

## 2021-03-12 MED ORDER — AMOXICILLIN-POT CLAVULANATE 875-125 MG PO TABS: 1.0000 | ORAL_TABLET | Freq: Two times a day (BID) | ORAL | 0 refills | Status: DC

## 2021-03-12 NOTE — Telephone Encounter (Signed)
Patient is also waiting for his referral to the infectious disease doctor.  The medication was supposed to be called into pharmacy 1 day ago.Please advise.

## 2021-03-12 NOTE — Anesthesia Postprocedure Evaluation (Signed)
Anesthesia Post Note  Patient: Cesareo Vickrey  Procedure(s) Performed: DEBRIDEMENT WOUND (Right) GRAFT APPLICATION (Right) BONE BIOPSY (Right)     Patient location during evaluation: PACU Anesthesia Type: MAC Level of consciousness: awake and alert Pain management: pain level controlled Vital Signs Assessment: post-procedure vital signs reviewed and stable Respiratory status: spontaneous breathing, nonlabored ventilation, respiratory function stable and patient connected to nasal cannula oxygen Cardiovascular status: stable and blood pressure returned to baseline Postop Assessment: no apparent nausea or vomiting Anesthetic complications: no   No notable events documented.  Last Vitals:  Vitals:   03/06/21 1600 03/06/21 1615  BP: (!) 172/80 (!) 162/71  Pulse: (!) 58 65  Resp: 18 17  Temp:  36.6 C  SpO2: 94% 99%    Last Pain:  Vitals:   03/06/21 1615  TempSrc:   PainSc: 0-No pain                 Andru Genter

## 2021-03-12 NOTE — Telephone Encounter (Signed)
Patient is calling for the status of an antibiotic and pain medicine refill that was supposed to be sent to his pharmacy on file. Please advise.

## 2021-03-12 NOTE — Progress Notes (Signed)
Subjective: Raymond [name] is a 60 y.o. is seen today in office s/p right foot bone biopsy, wound debridement with ACell application preformed on 03/06/2021.  He has been on doxycycline.  No fevers or chills.  He has no other concerns today.  Specimen Description WOUND RIGHT FOOT PROXIMAL PHALANX  Performed at [facility], [place] [address] [place], [place] [address]   Special Requests NONE  Performed at [facility], [place] [address], [place], [place] [address]   Gram Stain NO WBC SEEN  NO ORGANISMS SEEN   Culture RARE NORMAL SKIN FLORA  NO GROUP A STREP (S.PYOGENES) ISOLATED  NO STAPHYLOCOCCUS AUREUS ISOLATED  NO ANAEROBES ISOLATED  Performed at [facility], [place] [address], [place], [place] [address]   Report Status 03/11/2021 FINAL   ----- Specimen Description TISSUE RIGHT FOOT METATARSAL  Performed at [place] [address] [place],  [address]   Special Requests NONE  Performed at [facility], [place] [address], [place], [place] [address]   Gram Stain NO WBC SEEN  NO ORGANISMS SEEN   Culture No growth aerobically or anaerobically.  Performed at [facility], [place] [address], [place],  [address]   Report Status 03/11/2021 FINAL   ----- SURGICAL PATHOLOGY  CASE: [id number]  PATIENT: Raymond [name]  Surgical Pathology Report      Clinical History: Diabetic Ulcer Right Foot ([place])      FINAL MICROSCOPIC DIAGNOSIS:   A. RIGHT FOOT, METATARSAL:  - Bone, osteomyelitis not identified.   B. RIGHT FOOT, PROXIMAL, PHALANX:  - Bone, osteomyelitis not identified.  -----  Objective: General: No acute distress, AAOx3  DP/PT pulses palpable CRT < 3 sec to all digits.  Right foot: Bandages intact.  Dirty.  He states he has not been walking much, however.  Incision is well coapted without any evidence of dehiscence on the biopsy sites.  Wound on the plantar aspect with ACell graft  intact.  There is no surrounding erythema, ascending cellulitis, fluctuance, crepitus, malodor, drainage/purulence. There is mild edema around the surgical site. There is no pain along the surgical site.  No pain with calf compression, swelling, warmth, erythema.   Assessment and Plan:  Status post right foot wound debridement, ACell application with bone biopsy, doing well with no complications   -Treatment options discussed including all alternatives, risks, and complications -X-rays obtained reviewed.  No evidence of acute fracture.  Staples intact.  Arthritic changes present the first MPJ. -Dressing was changed.  New Nonstick Dressing Was Applied into Wound Followed by [employer] with 4 X 4, ABD, Ace. -He has an area of doxycycline.  Waiting for culture results.  Likely change antibiotics.  Referral to infectious disease as well.   -Elevate and limit activity -He has previously followed with Dr. Gwenlyn Found as well in regards to circulation. 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 His LEAs showed widely patent vessels to the foot on the right.  He has not improving can also reevaluate with Dr. Gwenlyn Found -Glucose control. -Monitor for any clinical signs or symptoms of infection and DVT/PE and directed to call the office immediately should any occur or go to the ER. -Follow-up as scheduled or sooner if any problems arise. In the meantime, encouraged to call the office with any questions, concerns, change in symptoms.   Celesta Gentile, DPM

## 2021-03-13 ENCOUNTER — Other Ambulatory Visit: Payer: Self-pay

## 2021-03-13 ENCOUNTER — Ambulatory Visit (INDEPENDENT_AMBULATORY_CARE_PROVIDER_SITE_OTHER): Payer: BC Managed Care – PPO | Admitting: Internal Medicine

## 2021-03-13 ENCOUNTER — Encounter: Payer: Self-pay | Admitting: Internal Medicine

## 2021-03-13 DIAGNOSIS — L089 Local infection of the skin and subcutaneous tissue, unspecified: Secondary | ICD-10-CM | POA: Diagnosis not present

## 2021-03-13 DIAGNOSIS — E11628 Type 2 diabetes mellitus with other skin complications: Secondary | ICD-10-CM | POA: Diagnosis not present

## 2021-03-13 MED ORDER — AMOXICILLIN-POT CLAVULANATE 875-125 MG PO TABS: 1.0000 | ORAL_TABLET | Freq: Two times a day (BID) | ORAL | 1 refills | Status: DC

## 2021-03-13 MED ORDER — LINEZOLID 600 MG PO TABS: 600.0000 mg | ORAL_TABLET | Freq: Two times a day (BID) | ORAL | 0 refills | Status: DC

## 2021-03-13 NOTE — Progress Notes (Signed)
[facility]  Reason for Consult: Right diabetic foot infection Referring Provider: Dr. Celesta Gentile  Assessment: His recent bone biopsies did not show osteomyelitis but he may have involvement of his lateral sesamoid and he probably has had some soft tissue infection.  I note that his MRSA isolate from last year was resistant to tetracycline antibiotics.  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  I reviewed all options for antibiotic management with him including continued oral therapy versus IV antibiotic therapy.  I recommend using oral linezolid and amoxicillin clavulanate.  He is in agreement with that plan.  I also note that his hemoglobin A1c was greater than 15.5 when obtained recently.  In addition to antibiotic therapy he will need much better control of his blood sugar, offloading of pressure from his ulcer and continued excellent wound care to have a good chance of healing his chronic ulcer.  Plan: Start oral linezolid (I reviewed this with our ID pharmacist and we do not feel there will be a significant interaction between linezolid and his brimonidine eyedrops) and amoxicillin clavulanate Follow-up in 2 weeks  Patient Active Problem List   Diagnosis Date Noted   Diabetic foot infection ([place])     Priority: High   Peripheral vascular disease ([place]) 04/24/2020   Pyogenic inflammation of bone ([place])    Thrombophlebitis of superficial veins of right lower extremity    Difficult intravenous access    MRSA bacteremia    Infected wound 01/12/2020   Critical lower limb ischemia ([place]) 02/02/2019   Personal history of noncompliance with medical treatment, presenting hazards to health 10/07/2018   Critical limb ischemia with history of revascularization of same extremity ([place]) 07/27/2018   Mixed hyperlipidemia 02/19/2018   Class 1 obesity due to excess calories with serious comorbidity and body mass index (BMI) of 31.0 to 31.9 in  adult 02/19/2018   History of renal transplant 02/19/2018   Bilateral impacted cerumen 08/31/2017   Conductive hearing loss, bilateral 08/31/2017   Nuclear sclerotic cataract of right eye 10/07/2016   Pseudophakia of left eye 10/07/2016   Hypertension due to endocrine disorder 03/04/2016   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 ([place]) 03/04/2016   Renal failure 03/04/2016   Proliferative diabetic retinopathy with macular edema associated with type 2 diabetes mellitus ([place]) 03/04/2016   Acute kidney injury ([place]) 12/10/2015   Immunosuppression ([place]) 12/10/2015   Nausea vomiting and diarrhea 12/10/2015   Diabetes ([place]) 12/10/2015   Essential hypertension, benign 12/10/2015   ARF (acute renal failure) ([place]) 12/10/2015   Pancytopenia ([place]) 12/10/2015   Encounter for screening colonoscopy 11/15/2014   GERD (gastroesophageal reflux disease) 11/15/2014    Patient's Medications  New Prescriptions   LINEZOLID (ZYVOX) 600 MG TABLET    Take 1 tablet (600 mg total) by mouth 2 (two) times daily.  Previous Medications   ATORVASTATIN (LIPITOR) 10 MG TABLET    Take 1 tablet (10 mg total) by mouth daily.   BLOOD GLUCOSE METER KIT AND SUPPLIES KIT    Dispense based on patient and insurance preference. Use up to four times daily as directed. (FOR ICD-9 250.00, 250.01).   BLOOD GLUCOSE MONITORING SUPPL (ACCU-CHEK GUIDE ME) W/DEVICE KIT    1 Piece by Does not apply route as directed.   BRIMONIDINE (ALPHAGAN) 0.2 % OPHTHALMIC SOLUTION    Apply to eye.   CLONIDINE (CATAPRES - DOSED IN MG/24 HR) 0.3  MG/24HR PATCH    Place 0.3 mg onto the skin once a week.   CONTINUOUS BLOOD GLUC RECEIVER (FREESTYLE LIBRE 2 READER) DEVI    As directed   CONTINUOUS BLOOD GLUC SENSOR (FREESTYLE LIBRE 2 SENSOR) MISC    1 Piece by Does not apply route every 14 (fourteen) days.   DORZOLAMIDE-TIMOLOL (COSOPT) 22.3-6.8 MG/ML OPHTHALMIC SOLUTION    Apply to eye.    DOXAZOSIN (CARDURA) 4 MG TABLET    Take 6 mg by mouth daily.    GLIPIZIDE (GLUCOTROL XL) 5 MG 24 HR TABLET    Take 5 mg by mouth daily.    GLUCOSE BLOOD (ACCU-CHEK GUIDE) TEST STRIP    Use as instructed 4 x daily. E11.65   HYDRALAZINE (APRESOLINE) 25 MG TABLET    Take 25 mg by mouth 2 (two) times daily.   HYDROCODONE-ACETAMINOPHEN (NORCO/VICODIN) 5-325 MG TABLET    Take 1 tablet by mouth every 6 (six) hours as needed.   INSULIN NPH, HUMAN,, ISOPHANE, (NOVOLIN N FLEXPEN) 100 UNIT/ML KIWKPEN    Inject 100 Units into the skin every morning.   LATANOPROST (XALATAN) 0.005 % OPHTHALMIC SOLUTION    Apply to eye.   MULTIPLE VITAMIN (MULTIVITAMIN) CAPSULE    Take 1 capsule by mouth daily.   SILVER SULFADIAZINE (SILVADENE) 1 % CREAM    Apply 1 application topically daily.   SODIUM BICARBONATE 650 MG TABLET    Take 1,300 mg by mouth 3 (three) times daily.   TACROLIMUS (PROGRAF) 1 MG CAPSULE    Take 4 mg by mouth 2 (two) times daily. Take 3 capsules (17m) in AM & take 3 caps (370m in PM   TRIAMCINOLONE (KENALOG) 0.025 % OINTMENT    Apply 1 application topically 3 (three) times daily.   VITAMIN D, ERGOCALCIFEROL, (DRISDOL) 50000 UNITS CAPS CAPSULE    Take 50,000 Units by mouth every 30 (thirty) days.  Modified Medications   Modified Medication Previous Medication   AMOXICILLIN-CLAVULANATE (AUGMENTIN) 875-125 MG TABLET amoxicillin-clavulanate (AUGMENTIN) 875-125 MG tablet      Take 1 tablet by mouth 2 (two) times daily.    Take 1 tablet by mouth 2 (two) times daily.  Discontinued Medications   CYCLOBENZAPRINE (FLEXERIL) 5 MG TABLET    Take 2 tablets (10 mg total) by mouth 3 (three) times daily as needed for muscle spasms.   DOXYCYCLINE (VIBRAMYCIN) 100 MG CAPSULE    Take 1 capsule (100 mg total) by mouth 2 (two) times daily.   HYDROXYZINE (ATARAX/VISTARIL) 25 MG TABLET    Take 25 mg by mouth 3 (three) times daily as needed.   MUPIROCIN OINTMENT (BACTROBAN) 2 %    Apply 1 application topically 2 (two) times  daily.    HPI: Raymond [name] a 89.o. male with diabetes and peripheral neuropathy.  He has had problems off and on with diabetic foot ulcers.  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  He was seen by my partners and treated with a long course of IV daptomycin.  He tells me that his ulcers are improved greatly and was almost completely closed earlier this year.  He saw Dr. WaJacqualyn Poseyn August of this year, noting that the wound appeared to be getting worse.  He has had some debridements.  Blood work on 01/31/2021 showed that his C-reactive protein was normal and that his sedimentation rate was only slightly elevated at 25.  A wound culture on 02/12/2021 was reported to  grow a "mix of organisms".  An MRI done on 02/22/2021 showed edema in the lateral sesamoid concerning for possible osteomyelitis and right first metatarsal joint fusion without obvious septic arthritis or osteomyelitis.  He underwent operative debridement on 03/06/2021.  Operative gram stain showed no organisms and cultures grew only rare normal skin flora.  Pathology report indicated that "osteomyelitis was not identified".  Biopsies were done of both the right first metatarsal head and the right first proximal phalanx.  He was started on doxycycline 2 weeks ago.  He has not noted any changes.  Amoxicillin clavulanate was prescribed yesterday but he has not picked up the prescription.  Review of Systems: Review of Systems  Constitutional:  Negative for chills, diaphoresis and fever.  Gastrointestinal:  Negative for abdominal pain, diarrhea, nausea and vomiting.  Musculoskeletal:  Negative for joint pain.     Past Medical History:  Diagnosis Date   Chronic kidney disease    Awaiting transplant   Diabetes ([place])    Diabetes mellitus without complication ([place])    Hypertension    Renal disorder    Renal insufficiency     Social History   Tobacco Use    Smoking status: Never   Smokeless tobacco: Never  Vaping Use   Vaping Use: Never used  Substance Use Topics   Alcohol use: No    Alcohol/week: 0.0 standard drinks   Drug use: No    Family History  Problem Relation Age of Onset   Diabetes Mother    Heart disease Father    Colon cancer Neg Hx    No Known Allergies  OBJECTIVE: Vitals:   03/13/21 0922  BP: (!) 188/102  Pulse: 63  Temp: 98.1 F (36.7 C)  TempSrc: Oral  SpO2: 99%  Weight: 205 lb (93 kg)  Height: '5\' 8"'  (5.974 m)   Body mass index is 31.17 kg/m.   Physical Exam Constitutional:      General: He is not in acute distress.    Comments: He is very pleasant.  He is accompanied by his wife.  Musculoskeletal:     Comments: He has a large plantar ulcer under his right first metatarsal head.  He has thin bloody drainage.  There is no malodor.  There is no obvious exposed bone.  He has some diffuse swelling over the dorsum of his foot.  There is no unusual erythema, warmth or fluctuance noted.  Psychiatric:        Mood and Affect: Mood normal.     Microbiology: Recent Results (from the past 240 hour(s))  Surgical pcr screen     Status: None   Collection Time: 03/06/21 10:43 AM   Specimen: Nasal Mucosa; Nasal Swab  Result Value Ref Range Status   MRSA, PCR NEGATIVE NEGATIVE Final   Staphylococcus aureus NEGATIVE NEGATIVE Final    Comment: (NOTE) The Xpert SA Assay (FDA approved for NASAL specimens in patients 63 years of age and older), is one component of a comprehensive surveillance program. It is not intended to diagnose infection nor to guide or monitor treatment. Performed at [facility], [place] [address] [place], [place] [address]   Aerobic/Anaerobic Culture w Gram Stain (surgical/deep wound)     Status: None   Collection Time: 03/06/21  2:06 PM   Specimen: Foot, Right; Wound  Result Value Ref Range Status   Specimen Description   Final    TISSUE RIGHT FOOT METATARSAL Performed  at [place] [address] [place],  [place] [address]    Special Requests   Final    NONE Performed at [facility], [place] [address] [place], [place] [address]    Gram Stain NO WBC SEEN NO ORGANISMS SEEN   Final   Culture   Final    No growth aerobically or anaerobically. Performed at [facility], [place] [address], [place], [place] [address]    Report Status 03/11/2021 FINAL  Final  Aerobic/Anaerobic Culture w Gram Stain (surgical/deep wound)     Status: None   Collection Time: 03/06/21  2:07 PM   Specimen: Foot, Right; Wound  Result Value Ref Range Status   Specimen Description   Final    WOUND RIGHT FOOT PROXIMAL PHALANX Performed at [facility], [place] [address] [place], [place] [address]    Special Requests   Final    NONE Performed at [facility], [place] [address] [place], [place] [address]    Gram Stain NO WBC SEEN NO ORGANISMS SEEN   Final   Culture   Final    RARE NORMAL SKIN FLORA NO GROUP A STREP (S.PYOGENES) ISOLATED NO STAPHYLOCOCCUS AUREUS ISOLATED NO ANAEROBES ISOLATED Performed at [facility], [place] [address] [place], [place] [address]    Report Status 03/11/2021 FINAL  Final    Michel Bickers, MD [facility] [phone] pager   [phone] cell 03/13/2021, 9:52 AM

## 2021-03-14 ENCOUNTER — Encounter: Payer: Self-pay | Admitting: Nurse Practitioner

## 2021-03-14 ENCOUNTER — Ambulatory Visit (INDEPENDENT_AMBULATORY_CARE_PROVIDER_SITE_OTHER): Payer: BC Managed Care – PPO | Admitting: Nurse Practitioner

## 2021-03-14 VITALS — BP 138/80 | HR 68 | Temp 98.1°F | Ht 68.0 in | Wt 218.8 lb

## 2021-03-14 DIAGNOSIS — E1122 Type 2 diabetes mellitus with diabetic chronic kidney disease: Secondary | ICD-10-CM | POA: Diagnosis not present

## 2021-03-14 DIAGNOSIS — E113513 Type 2 diabetes mellitus with proliferative diabetic retinopathy with macular edema, bilateral: Secondary | ICD-10-CM

## 2021-03-14 DIAGNOSIS — Z794 Long term (current) use of insulin: Secondary | ICD-10-CM

## 2021-03-14 DIAGNOSIS — Z23 Encounter for immunization: Secondary | ICD-10-CM | POA: Diagnosis not present

## 2021-03-14 DIAGNOSIS — I129 Hypertensive chronic kidney disease with stage 1 through stage 4 chronic kidney disease, or unspecified chronic kidney disease: Secondary | ICD-10-CM | POA: Diagnosis not present

## 2021-03-14 DIAGNOSIS — N182 Chronic kidney disease, stage 2 (mild): Secondary | ICD-10-CM | POA: Diagnosis not present

## 2021-03-14 DIAGNOSIS — E782 Mixed hyperlipidemia: Secondary | ICD-10-CM

## 2021-03-14 MED ORDER — TETANUS-DIPHTH-ACELL PERTUSSIS 5-2.5-18.5 LF-MCG/0.5 IM SUSY
0.5000 mL | PREFILLED_SYRINGE | Freq: Once | INTRAMUSCULAR | Status: AC
  Administered 2021-03-14: 0.5 mL via INTRAMUSCULAR

## 2021-03-14 MED ORDER — ATORVASTATIN CALCIUM 10 MG PO TABS: 10.0000 mg | ORAL_TABLET | Freq: Every day | ORAL | 1 refills | Status: DC

## 2021-03-14 NOTE — Patient Instructions (Addendum)
Hypertension, Adult Hypertension is another name for high blood pressure. High blood pressure forces your heart to work harder to pump blood. This can cause problems over time. There are two numbers in a blood pressure reading. There is a top number (systolic) over a bottom number (diastolic). It is best to have a blood pressure that is below 120/80. Healthy choices can help lower your blood pressure, or you may need medicine to help lower it. What are the causes? The cause of this condition is not known. Some conditions may be related to high blood pressure. What increases the risk? Smoking. Having type 2 diabetes mellitus, high cholesterol, or both. Not getting enough exercise or physical activity. Being overweight. Having too much fat, sugar, calories, or salt (sodium) in your diet. Drinking too much alcohol. Having long-term (chronic) kidney disease. Having a family history of high blood pressure. Age. Risk increases with age. Race. You may be at higher risk if you are African American. Gender. Men are at higher risk than women before age 45. After age 65, women are at higher risk than men. Having obstructive sleep apnea. Stress. What are the signs or symptoms? High blood pressure may not cause symptoms. Very high blood pressure (hypertensive crisis) may cause: Headache. Feelings of worry or nervousness (anxiety). Shortness of breath. Nosebleed. A feeling of being sick to your stomach (nausea). Throwing up (vomiting). Changes in how you see. Very bad chest pain. Seizures. How is this treated? This condition is treated by making healthy lifestyle changes, such as: Eating healthy foods. Exercising more. Drinking less alcohol. Your health care provider may prescribe medicine if lifestyle changes are not enough to get your blood pressure under control, and if: Your top number is above 130. Your bottom number is above 80. Your personal target blood pressure may vary. Follow  these instructions at home: Eating and drinking  If told, follow the DASH eating plan. To follow this plan: Fill one half of your plate at each meal with fruits and vegetables. Fill one fourth of your plate at each meal with whole grains. Whole grains include whole-wheat pasta, brown rice, and whole-grain bread. Eat or drink low-fat dairy products, such as skim milk or low-fat yogurt. Fill one fourth of your plate at each meal with low-fat (lean) proteins. Low-fat proteins include fish, chicken without skin, eggs, beans, and tofu. Avoid fatty meat, cured and processed meat, or chicken with skin. Avoid pre-made or processed food. Eat less than 1,500 mg of salt each day. Do not drink alcohol if: Your doctor tells you not to drink. You are pregnant, may be pregnant, or are planning to become pregnant. If you drink alcohol: Limit how much you use to: 0-1 drink a day for women. 0-2 drinks a day for men. Be aware of how much alcohol is in your drink. In the [place] one drink equals one 12 oz bottle of beer (355 mL), one 5 oz glass of wine (148 mL), or one 1 oz glass of hard liquor (44 mL). Lifestyle  Work with your doctor to stay at a healthy weight or to lose weight. Ask your doctor what the best weight is for you. Get at least 30 minutes of exercise most days of the week. This may include walking, swimming, or biking. Get at least 30 minutes of exercise that strengthens your muscles (resistance exercise) at least 3 days a week. This may include lifting weights or doing Pilates. Do not use any products that contain nicotine or tobacco, such   as cigarettes, e-cigarettes, and chewing tobacco. If you need help quitting, ask your doctor. Check your blood pressure at home as told by your doctor. Keep all follow-up visits as told by your doctor. This is important. Medicines Take over-the-counter and prescription medicines only as told by your doctor. Follow directions carefully. Do not skip doses of  blood pressure medicine. The medicine does not work as well if you skip doses. Skipping doses also puts you at risk for problems. Ask your doctor about side effects or reactions to medicines that you should watch for. Contact a doctor if you: Think you are having a reaction to the medicine you are taking. Have headaches that keep coming back (recurring). Feel dizzy. Have swelling in your ankles. Have trouble with your vision. Get help right away if you: Get a very bad headache. Start to feel mixed up (confused). Feel weak or numb. Feel faint. Have very bad pain in your: Chest. Belly (abdomen). Throw up more than once. Have trouble breathing. Summary Hypertension is another name for high blood pressure. High blood pressure forces your heart to work harder to pump blood. For most people, a normal blood pressure is less than 120/80. Making healthy choices can help lower blood pressure. If your blood pressure does not get lower with healthy choices, you may need to take medicine. This information is not intended to replace advice given to you by your health care provider. Make sure you discuss any questions you have with your health care provider. Document Revised: 01/20/2018 Document Reviewed: 01/20/2018 Elsevier Patient Education  [place].  Tdap (Tetanus, Diphtheria, Pertussis) Vaccine: What You Need to Know 1. Why get vaccinated? Tdap vaccine can prevent tetanus, diphtheria, and pertussis. Diphtheria and pertussis spread from person to person. Tetanus enters the body through cuts or wounds. TETANUS (T) causes painful stiffening of the muscles. Tetanus can lead to serious health problems, including being unable to open the mouth, having trouble swallowing and breathing, or death. DIPHTHERIA (D) can lead to difficulty breathing, heart failure, paralysis, or death. PERTUSSIS (aP), also known as "whooping cough," can cause uncontrollable, violent coughing that makes it hard to  breathe, eat, or drink. Pertussis can be extremely serious especially in babies and young children, causing pneumonia, convulsions, brain damage, or death. In teens and adults, it can cause weight loss, loss of bladder control, passing out, and rib fractures from severe coughing. 2. Tdap vaccine Tdap is only for children 7 years and older, adolescents, and adults.  Adolescents should receive a single dose of Tdap, preferably at age 55 or 56 years. Pregnant people should get a dose of Tdap during every pregnancy, preferably during the early part of the third trimester, to help protect the newborn from pertussis. Infants are most at risk for severe, life-threatening complications from pertussis. Adults who have never received Tdap should get a dose of Tdap. Also, adults should receive a booster dose of either Tdap or Td (a different vaccine that protects against tetanus and diphtheria but not pertussis) every 10 years, or after 5 years in the case of a severe or dirty wound or burn. Tdap may be given at the same time as other vaccines. 3. Talk with your health care provider Tell your vaccine provider if the person getting the vaccine: Has had an allergic reaction after a previous dose of any vaccine that protects against tetanus, diphtheria, or pertussis, or has any severe, life-threatening allergies Has had a coma, decreased level of consciousness, or prolonged seizures within  7 days after a previous dose of any pertussis vaccine (DTP, DTaP, or Tdap) Has seizures or another nervous system problem Has ever had Guillain-Barr Syndrome (also called "GBS") Has had severe pain or swelling after a previous dose of any vaccine that protects against tetanus or diphtheria In some cases, your health care provider may decide to postpone Tdap vaccination until a future visit. People with minor illnesses, such as a cold, may be vaccinated. People who are moderately or severely ill should usually wait until they  recover before getting Tdap vaccine.  Your health care provider can give you more information. 4. Risks of a vaccine reaction Pain, redness, or swelling where the shot was given, mild fever, headache, feeling tired, and nausea, vomiting, diarrhea, or stomachache sometimes happen after Tdap vaccination. People sometimes faint after medical procedures, including vaccination. Tell your provider if you feel dizzy or have vision changes or ringing in the ears.  As with any medicine, there is a very remote chance of a vaccine causing a severe allergic reaction, other serious injury, or death. 5. What if there is a serious problem? An allergic reaction could occur after the vaccinated person leaves the clinic. If you see signs of a severe allergic reaction (hives, swelling of the face and throat, difficulty breathing, a fast heartbeat, dizziness, or weakness), call 9-1-1 and get the person to the nearest hospital. For other signs that concern you, call your health care provider.  Adverse reactions should be reported to the Vaccine Adverse Event Reporting System (VAERS). Your health care provider will usually file this report, or you can do it yourself. Visit the VAERS website at www.vaers.SamedayNews.es or call (479) 753-3844. VAERS is only for reporting reactions, and VAERS staff members do not give medical advice. 6. The National Vaccine Injury Compensation Program The [employer] Vaccine Injury Compensation Program (VICP) is a federal program that was created to compensate people who may have been injured by certain vaccines. Claims regarding alleged injury or death due to vaccination have a time limit for filing, which may be as short as two years. Visit the VICP website at GoldCloset.com.ee or call 5047248089 to learn about the program and about filing a claim. 7. How can I learn more? Ask your health care provider. Call your local or state health department. Visit the website of the Food and  Drug Administration (FDA) for vaccine package inserts and additional information at TraderRating.uy. Contact the Centers for Disease Control and Prevention (CDC): Call 281-343-0527 (1-800-CDC-INFO) or Visit CDC's website at http://hunter.com/. Vaccine Information Statement Tdap (Tetanus, Diphtheria, Pertussis) Vaccine (12/30/2019) This information is not intended to replace advice given to you by your health care provider. Make sure you discuss any questions you have with your health care provider. Document Revised: 01/25/2020 Document Reviewed: 01/25/2020 Elsevier Patient Education  2022 [employer].

## 2021-03-14 NOTE — Progress Notes (Signed)
I,Raymond [name],acting as a Education administrator for Raymond Brine, FNP.,have documented all relevant documentation on the behalf of Raymond Brine, FNP,as directed by  Raymond Brine, FNP while in the presence of Raymond [name], [name].   This visit occurred during the SARS-CoV-2 public health emergency.  Safety protocols were in place, including screening questions prior to the visit, additional usage of staff PPE, and extensive cleaning of exam room while observing appropriate contact time as indicated for disinfecting solutions.  Subjective:     Patient ID: Raymond [name] , male    DOB: [birth year] , 60 y.o.   MRN: [id number]   Chief Complaint  Patient presents with   Hypertension     HPI  Pt here today for DM & HTN F/U. Pt went to see wound specialist yesterday, Dr. Megan [name]. . Dr snider his urologist changes his insulin to 100 units daily. He is to follow up in 2 weeks. He has a freestyle libre in place to see how his blood sugars are ranging.  He denies headache, chest pain, shortness of breath, dizziness. He has no specific questions or concerns.   He is currently out of work while he is healing. [facility]. He is currently taking antibiotics.   Hypertension This is a chronic problem. The current episode started more than 1 year ago. The problem is unchanged. The problem is controlled. Pertinent negatives include no anxiety, blurred vision, chest pain or palpitations.  Hyperlipidemia Pertinent negatives include no chest pain.  Diabetes Pertinent negatives include no chest pain.    Past Medical History:  Diagnosis Date   Chronic kidney disease    Awaiting transplant   Diabetes ([place])    Diabetes mellitus without complication ([place])    Hypertension    Renal disorder    Renal insufficiency      Family History  Problem Relation Age of Onset   Diabetes Mother    Heart disease Father    Colon cancer Neg Hx      Current Outpatient Medications:     amoxicillin-clavulanate (AUGMENTIN) 875-125 MG tablet, Take 1 tablet by mouth 2 (two) times daily., Disp: 60 tablet, Rfl: 1   blood glucose meter kit and supplies KIT, Dispense based on patient and insurance preference. Use up to four times daily as directed. (FOR ICD-9 250.00, 250.01)., Disp: 1 each, Rfl: 0   Blood Glucose Monitoring Suppl (ACCU-CHEK GUIDE ME) w/Device KIT, 1 Piece by Does not apply route as directed., Disp: 1 kit, Rfl: 0   brimonidine (ALPHAGAN) 0.2 % ophthalmic solution, Apply to eye., Disp: , Rfl:    cloNIDine (CATAPRES - DOSED IN MG/24 HR) 0.3 mg/24hr patch, Place 0.3 mg onto the skin once a week., Disp: , Rfl:    Continuous Blood Gluc Receiver (FREESTYLE LIBRE 2 READER) DEVI, As directed, Disp: 1 each, Rfl: 0   Continuous Blood Gluc Sensor (FREESTYLE LIBRE 2 SENSOR) MISC, 1 Piece by Does not apply route every 14 (fourteen) days., Disp: 2 each, Rfl: 3   dorzolamide-timolol (COSOPT) 22.3-6.8 MG/ML ophthalmic solution, Apply to eye., Disp: , Rfl:    glipiZIDE (GLUCOTROL XL) 5 MG 24 hr tablet, Take 5 mg by mouth daily. , Disp: , Rfl:    glucose blood (ACCU-CHEK GUIDE) test strip, Use as instructed 4 x daily. E11.65, Disp: 150 each, Rfl: 5   hydrALAZINE (APRESOLINE) 25 MG tablet, Take 25 mg by mouth 2 (two) times daily., Disp: , Rfl:    latanoprost (XALATAN) 0.005 %  ophthalmic solution, Apply to eye., Disp: , Rfl:    linezolid (ZYVOX) 600 MG tablet, Take 1 tablet (600 mg total) by mouth 2 (two) times daily., Disp: 60 tablet, Rfl: 0   Multiple Vitamin (MULTIVITAMIN) capsule, Take 1 capsule by mouth daily., Disp: , Rfl:    sodium bicarbonate 650 MG tablet, Take 1,300 mg by mouth 3 (three) times daily., Disp: , Rfl:    tacrolimus (PROGRAF) 1 MG capsule, Take 4 mg by mouth 2 (two) times daily. Take 3 capsules (20m) in AM & take 3 caps (348m in PM, Disp: , Rfl:    Vitamin D, Ergocalciferol, (DRISDOL) 50000 units CAPS capsule, Take 50,000 Units by mouth every 30 (thirty) days., Disp: ,  Rfl:    atorvastatin (LIPITOR) 10 MG tablet, Take 1 tablet (10 mg total) by mouth daily., Disp: 90 tablet, Rfl: 1   doxazosin (CARDURA) 4 MG tablet, Take 6 mg by mouth daily.  (Patient not taking: Reported on 03/14/2021), Disp: , Rfl:    HYDROcodone-acetaminophen (NORCO/VICODIN) 5-325 MG tablet, Take 1 tablet by mouth every 6 (six) hours as needed. (Patient not taking: Reported on 03/14/2021), Disp: 10 tablet, Rfl: 0   Insulin NPH, Human,, Isophane, (NOVOLIN N FLEXPEN) 100 UNIT/ML Kiwkpen, Inject 100 Units into the skin every morning., Disp: 105 mL, Rfl: 3   silver sulfADIAZINE (SILVADENE) 1 % cream, Apply 1 application topically daily. (Patient not taking: No sig reported), Disp: 50 g, Rfl: 0   triamcinolone (KENALOG) 0.025 % ointment, Apply 1 application topically 3 (three) times daily. (Patient not taking: Reported on 03/14/2021), Disp: , Rfl:    No Known Allergies   Review of Systems  Constitutional: Negative.   HENT: Negative.    Eyes:  Negative for blurred vision.  Respiratory: Negative.    Cardiovascular:  Negative for chest pain, palpitations and leg swelling.  Gastrointestinal: Negative.   Genitourinary: Negative.   Skin:        He has a dressing to his right foot  Allergic/Immunologic: Negative.   Neurological: Negative.   Psychiatric/Behavioral: Negative.      Today's Vitals   03/14/21 0835  BP: 138/80  Pulse: 68  Temp: 98.1 F (36.7 C)  Weight: 218 lb 12.8 oz (99.2 kg)  Height: '5\' 8"'  (1.727 m)  PainSc: 0-No pain   Body mass index is 33.27 kg/m.  Wt Readings from Last 3 Encounters:  03/14/21 218 lb 12.8 oz (99.2 kg)  03/13/21 205 lb (93 kg)  03/06/21 202 lb (91.6 kg)    Objective:  Physical Exam Vitals reviewed.  Constitutional:      General: He is not in acute distress.    Appearance: Normal appearance. He is obese.  Cardiovascular:     Rate and Rhythm: Normal rate and regular rhythm.     Pulses: Normal pulses.     Heart sounds: Normal heart sounds. No  murmur heard. Pulmonary:     Effort: Pulmonary effort is normal. No respiratory distress.     Breath sounds: Normal breath sounds. No wheezing.  Skin:    General: Skin is warm and dry.     Capillary Refill: Capillary refill takes less than 2 seconds.     Comments: Dressing in place to right foot and has walking shoe on  Neurological:     General: No focal deficit present.     Mental Status: He is alert and oriented to person, place, and time.     Cranial Nerves: No cranial nerve deficit.     Motor:  No weakness.  Psychiatric:        Mood and Affect: Mood normal.        Behavior: Behavior normal.        Thought Content: Thought content normal.        Judgment: Judgment normal.        Assessment And Plan:     1. Benign hypertension with chronic kidney disease Comments: Blood pressure is fairly controlled Continue current medications  2. Type 2 diabetes mellitus with stage 2 chronic kidney disease, with long-term current use of insulin (HCC) Comments: Poorly controlled, he is now seeing Dr. Loanne Drilling and has changed to NPH 100 units daily.  He is to f/u in 2 weeks for repeat HgbA1c.    3. Proliferative diabetic retinopathy of both eyes with macular edema associated with type 2 diabetes mellitus ([place])  4. Mixed hyperlipidemia Comments: Stable, continue medications, tolerating well.  - atorvastatin (LIPITOR) 10 MG tablet; Take 1 tablet (10 mg total) by mouth daily.  Dispense: 90 tablet; Refill: 1  5. Immunization due Influenza vaccine administered Encouraged to take Tylenol as needed for fever or muscle aches. Tetanus vaccine today while in office. TDAP will be administered to adults 70-11 years old every 10 years. - Flu Vaccine QUAD 6+ mos PF IM (Fluarix Quad PF) - Tdap (BOOSTRIX) injection 0.5 mL    Patient was given opportunity to ask questions. Patient verbalized understanding of the plan and was able to repeat key elements of the plan. All questions were answered to their  satisfaction.  Raymond Brine, FNP   I, Raymond Brine, FNP, have reviewed all documentation for this visit. The documentation on 03/14/21 for the exam, diagnosis, procedures, and orders are all accurate and complete.   IF YOU HAVE BEEN REFERRED TO A SPECIALIST, IT MAY TAKE 1-2 WEEKS TO SCHEDULE/PROCESS THE REFERRAL. IF YOU HAVE NOT HEARD FROM US/SPECIALIST IN TWO WEEKS, PLEASE GIVE [place] A CALL AT [phone] X [phone].   THE PATIENT IS ENCOURAGED TO PRACTICE SOCIAL DISTANCING DUE TO THE COVID-19 PANDEMIC.

## 2021-03-15 ENCOUNTER — Encounter: Payer: Self-pay | Admitting: Endocrinology

## 2021-03-20 ENCOUNTER — Telehealth: Payer: Self-pay

## 2021-03-20 ENCOUNTER — Telehealth: Payer: Self-pay | Admitting: *Deleted

## 2021-03-20 DIAGNOSIS — I70229 Atherosclerosis of native arteries of extremities with rest pain, unspecified extremity: Secondary | ICD-10-CM

## 2021-03-20 DIAGNOSIS — L97512 Non-pressure chronic ulcer of other part of right foot with fat layer exposed: Secondary | ICD-10-CM

## 2021-03-20 NOTE — Telephone Encounter (Signed)
Spoke with pt regarding message received from Dr. Jacqualyn Posey in regards to his slow healing foot ulcer. Per Dr. Gwenlyn Found would like to get lower extremity dopplers and see pt back in the office. Pt is in agreement to go forward with this plan. Pt verbalized understanding. Orders placed. Scheduler to call pt and set up dopplers and office visit.

## 2021-03-20 NOTE — Telephone Encounter (Signed)
Please advise 

## 2021-03-21 ENCOUNTER — Other Ambulatory Visit: Payer: Self-pay

## 2021-03-21 ENCOUNTER — Ambulatory Visit (INDEPENDENT_AMBULATORY_CARE_PROVIDER_SITE_OTHER): Payer: BC Managed Care – PPO | Admitting: Podiatry

## 2021-03-21 DIAGNOSIS — M8600 Acute hematogenous osteomyelitis, unspecified site: Secondary | ICD-10-CM

## 2021-03-21 DIAGNOSIS — L97512 Non-pressure chronic ulcer of other part of right foot with fat layer exposed: Secondary | ICD-10-CM

## 2021-03-21 DIAGNOSIS — I739 Peripheral vascular disease, unspecified: Secondary | ICD-10-CM

## 2021-03-21 NOTE — Telephone Encounter (Signed)
Called and spoke with the patient and patient stated that he saw Dr Jacqualyn Posey today. Lattie Haw

## 2021-03-24 NOTE — Progress Notes (Signed)
Subjective: Raymond [name] is a 60 y.o. is seen today in office s/p right foot bone biopsy, wound debridement with ACell application preformed on 03/06/2021.  Since I last saw him he follow-up with Dr. Megan [name] with infectious disease and is currently on Zyvox.  No fevers or chills.  He has no other concerns today.  Specimen Description WOUND RIGHT FOOT PROXIMAL PHALANX  Performed at [facility], [place] [address] [place], [place] [address]   Special Requests NONE  Performed at [facility], [place] [address] [place], [place] [address]   Gram Stain NO WBC SEEN  NO ORGANISMS SEEN   Culture RARE NORMAL SKIN FLORA  NO GROUP A STREP (S.PYOGENES) ISOLATED  NO STAPHYLOCOCCUS AUREUS ISOLATED  NO ANAEROBES ISOLATED  Performed at [facility], [place] [address] [place], [place] [address]   Report Status 03/11/2021 FINAL   ----- Specimen Description TISSUE RIGHT FOOT METATARSAL  Performed at [place] [address], [place], [place] [address]   Special Requests NONE  Performed at [facility], [place] [address], [place], [place] [address]   Gram Stain NO WBC SEEN  NO ORGANISMS SEEN   Culture No growth aerobically or anaerobically.  Performed at [facility], [place] [address], [place], [place] [address]   Report Status 03/11/2021 FINAL   ----- SURGICAL PATHOLOGY  CASE: [id number]  PATIENT: Raymond [name]  Surgical Pathology Report      Clinical History: Diabetic Ulcer Right Foot ([place])      FINAL MICROSCOPIC DIAGNOSIS:   A. RIGHT FOOT, METATARSAL:  - Bone, osteomyelitis not identified.   B. RIGHT FOOT, PROXIMAL, PHALANX:  - Bone, osteomyelitis not identified.  -----  Objective: General: No acute distress, AAOx3  DP/PT pulses palpable CRT < 3 sec to all digits.  Right foot: Bandages intact.  Upon removal of staples still intact along the periphery of the wound.  Granulation tissue is present.  To the wound  measured 2.5 x 2 cm with a depth of 0.4 cm.  There is no surrounding erythema but minimal edema.  There is no fluctuation or crepitation.  No malodor. No pain with calf compression, swelling, warmth, erythema.   Assessment and Plan:  Status post right foot wound debridement, ACell application with bone biopsy, doing well with no complications   -Treatment options discussed including all alternatives, risks, and complications -I remove the sutures today.  We will continue with every other day dressing changes.  Encouraged elevation, offloading as well as glucose control as I think offloading and glucose control also very significantly.  His wound healing.  Continue to follow with infectious disease as well for osteomyelitis.  Referral to wound care placed. -Has follow-up scheduled with Dr. Gwenlyn [name] in regards to circulation. -He has previously followed with Dr. Gwenlyn [name] as well in regards to circulation. 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 His LEAs showed widely patent vessels to the foot on the right.  He has not improving can also reevaluate with Dr. Gwenlyn [name] -Glucose control. -Monitor for any clinical signs or symptoms of infection and DVT/PE and directed to call the office immediately should any occur or go to the ER. -Follow-up as scheduled or sooner if any problems arise. In the meantime, encouraged to call the office with any questions, concerns, change in symptoms.   Raymond [name], DPM

## 2021-03-25 ENCOUNTER — Ambulatory Visit (INDEPENDENT_AMBULATORY_CARE_PROVIDER_SITE_OTHER): Payer: BC Managed Care – PPO | Admitting: Endocrinology

## 2021-03-25 ENCOUNTER — Other Ambulatory Visit: Payer: Self-pay

## 2021-03-25 VITALS — BP 120/70 | HR 55 | Ht 68.0 in | Wt 224.8 lb

## 2021-03-25 DIAGNOSIS — E1122 Type 2 diabetes mellitus with diabetic chronic kidney disease: Secondary | ICD-10-CM | POA: Diagnosis not present

## 2021-03-25 DIAGNOSIS — Z794 Long term (current) use of insulin: Secondary | ICD-10-CM | POA: Diagnosis not present

## 2021-03-25 DIAGNOSIS — N182 Chronic kidney disease, stage 2 (mild): Secondary | ICD-10-CM | POA: Diagnosis not present

## 2021-03-25 MED ORDER — TRULICITY 0.75 MG/0.5ML ~~LOC~~ SOAJ: 0.7500 mg | SUBCUTANEOUS | 3 refills | Status: DC

## 2021-03-25 NOTE — Progress Notes (Signed)
Subjective:    Patient ID: Raymond [name], male    DOB: 07/26/1960, 60 y.o.   MRN: [id number]  HPI Pt returns for f/u of diabetes mellitus:  DM type: Insulin-requiring type 2. Dx'ed: 4315 Complications: ESRD (transplant 2016, PDR, PAD, and foot ulcer) Therapy: insulin since 2018 DKA: never Severe hypoglycemia: never Pancreatitis: never Pancreatic imaging: normal on 2013 CT SDOH: he sometimes misses the insulin, and does not use continuous glucose monitor sensors; both due to cost. Other: he takes qd insulin, after poor results with multiple daily injections.   Interval history: I reviewed continuous glucose monitor data.  Glucose varies from 75-400.  It increases 7AM-12MN, and decreases overnight.  there is little trend throughout the day.  However, there is a wide variation at all times of day.  Pt says he never misses the insulin.   Past Medical History:  Diagnosis Date   Chronic kidney disease    Awaiting transplant   Diabetes ([place])    Diabetes mellitus without complication ([place])    Hypertension    Renal disorder    Renal insufficiency     Past Surgical History:  Procedure Laterality Date   BONE BIOPSY Right 03/06/2021   Procedure: BONE BIOPSY;  Surgeon: Trula Slade, DPM;  Location: [facility];  Service: Podiatry;  Laterality: Right;   CENTRAL VENOUS CATHETER INSERTION Right 01/20/2020   Procedure: INSERTION CENTRAL LINE ADULT; Tunneled central line;  Surgeon: Virl Cagey, MD;  Location: [facility];  Service: General;  Laterality: Right;   COLONOSCOPY N/A 12/05/2014   QMG:[id number] external and internal hemorrhoid/mild diverticulosis/11 polyps removed   ESOPHAGOGASTRODUODENOSCOPY N/A 12/05/2014   DTO:IZTI duodenitis   GRAFT APPLICATION Right 45/80/9983   Procedure: GRAFT APPLICATION;  Surgeon: Trula Slade, DPM;  Location: [facility];  Service: Podiatry;  Laterality: Right;   IR FLUORO GUIDE CV LINE RIGHT  03/01/2019   IR REMOVAL TUN CV CATH W/O FL  05/10/2019    IR REMOVAL TUN CV CATH W/O FL  02/29/2020   IR US GUIDE VASC ACCESS RIGHT  03/01/2019   KIDNEY TRANSPLANT  03/27/2015   Penile Pump Insertion     TEE WITHOUT CARDIOVERSION N/A 01/17/2020   Procedure: TRANSESOPHAGEAL ECHOCARDIOGRAM (TEE) WITH PROPOFOL;  Surgeon: Arnoldo Lenis, MD;  Location: [facility];  Service: Endoscopy;  Laterality: N/A;   WOUND DEBRIDEMENT Right 03/06/2021   Procedure: DEBRIDEMENT WOUND;  Surgeon: Trula Slade, DPM;  Location: [facility];  Service: Podiatry;  Laterality: Right;    Social History   Socioeconomic History   Marital status: Married    Spouse name: Not on file   Number of children: Not on file   Years of education: Not on file   Highest education level: Not on file  Occupational History   Not on file  Tobacco Use   Smoking status: Never   Smokeless tobacco: Never  Vaping Use   Vaping Use: Never used  Substance and Sexual Activity   Alcohol use: No    Alcohol/week: 0.0 standard drinks   Drug use: No   Sexual activity: Not on file  Other Topics Concern   Not on file  Social History Narrative   ** Merged History Encounter **       Social Determinants of Health   Financial Resource Strain: Not on file  Food Insecurity: Not on file  Transportation Needs: Not on file  Physical Activity: Not on file  Stress: Not on file  Social Connections: Not on file  Intimate  Partner Violence: Not on file    Current Outpatient Medications on File Prior to Visit  Medication Sig Dispense Refill   amoxicillin-clavulanate (AUGMENTIN) 875-125 MG tablet Take 1 tablet by mouth 2 (two) times daily. 60 tablet 1   atorvastatin (LIPITOR) 10 MG tablet Take 1 tablet (10 mg total) by mouth daily. 90 tablet 1   blood glucose meter kit and supplies KIT Dispense based on patient and insurance preference. Use up to four times daily as directed. (FOR ICD-9 250.00, 250.01). 1 each 0   Blood Glucose Monitoring Suppl (ACCU-CHEK GUIDE ME) w/Device KIT 1 Piece by  Does not apply route as directed. 1 kit 0   brimonidine (ALPHAGAN) 0.2 % ophthalmic solution Apply to eye.     cloNIDine (CATAPRES - DOSED IN MG/24 HR) 0.3 mg/24hr patch Place 0.3 mg onto the skin once a week.     Continuous Blood Gluc Receiver (FREESTYLE LIBRE 2 READER) DEVI As directed 1 each 0   Continuous Blood Gluc Sensor (FREESTYLE LIBRE 2 SENSOR) MISC 1 Piece by Does not apply route every 14 (fourteen) days. 2 each 3   dorzolamide-timolol (COSOPT) 22.3-6.8 MG/ML ophthalmic solution Apply to eye.     doxazosin (CARDURA) 4 MG tablet Take 6 mg by mouth daily.     glipiZIDE (GLUCOTROL XL) 5 MG 24 hr tablet Take 5 mg by mouth daily.      glucose blood (ACCU-CHEK GUIDE) test strip Use as instructed 4 x daily. E11.65 150 each 5   hydrALAZINE (APRESOLINE) 25 MG tablet Take 25 mg by mouth 2 (two) times daily.     HYDROcodone-acetaminophen (NORCO/VICODIN) 5-325 MG tablet Take 1 tablet by mouth every 6 (six) hours as needed. 10 tablet 0   Insulin NPH, Human,, Isophane, (NOVOLIN N FLEXPEN) 100 UNIT/ML Kiwkpen Inject 100 Units into the skin every morning. 105 mL 3   latanoprost (XALATAN) 0.005 % ophthalmic solution Apply to eye.     linezolid (ZYVOX) 600 MG tablet Take 1 tablet (600 mg total) by mouth 2 (two) times daily. 60 tablet 0   Multiple Vitamin (MULTIVITAMIN) capsule Take 1 capsule by mouth daily.     silver sulfADIAZINE (SILVADENE) 1 % cream Apply 1 application topically daily. 50 g 0   sodium bicarbonate 650 MG tablet Take 1,300 mg by mouth 3 (three) times daily.     tacrolimus (PROGRAF) 1 MG capsule Take 4 mg by mouth 2 (two) times daily. Take 3 capsules (71m) in AM & take 3 caps (345m in PM     triamcinolone (KENALOG) 0.025 % ointment Apply 1 application topically 3 (three) times daily.     Vitamin D, Ergocalciferol, (DRISDOL) 50000 units CAPS capsule Take 50,000 Units by mouth every 30 (thirty) days.     No current facility-administered medications on file prior to visit.    No Known  Allergies  Family History  Problem Relation Age of Onset   Diabetes Mother    Heart disease Father    Colon cancer Neg Hx     BP 120/70 (BP Location: Right Arm, Patient Position: Sitting, Cuff Size: Normal)   Pulse (!) 55   Ht '5\' 8"'  (1.727 m)   Wt 224 lb 12.8 oz (102 kg)   SpO2 96%   BMI 34.18 kg/m    Review of Systems He denies hypoglycemia    Objective:   Physical Exam       Assessment & Plan:  Insulin-requiring type 2 DM: uncontrolled.   Patient Instructions  check your blood  sugar twice a day.  vary the time of day when you check, between before the 3 meals, and at bedtime.  also check if you have symptoms of your blood sugar being too high or too low.  please keep a record of the readings and bring it to your next appointment here (or you can bring the meter itself).  You can write it on any piece of paper.  please call us sooner if your blood sugar goes below 70, or if most of your readings are over 200.   I have sent a prescription to your pharmacy, to add Trulicity.   Please continue the same insulin.  On this type of insulin schedule, you should eat meals on a regular schedule.  If a meal is missed or significantly delayed, your blood sugar could go low.  Please come back for a follow-up appointment in 1 month.

## 2021-03-25 NOTE — Patient Instructions (Addendum)
check your blood sugar twice a day.  vary the time of day when you check, between before the 3 meals, and at bedtime.  also check if you have symptoms of your blood sugar being too high or too low.  please keep a record of the readings and bring it to your next appointment here (or you can bring the meter itself).  You can write it on any piece of paper.  please call us sooner if your blood sugar goes below 70, or if most of your readings are over 200.   I have sent a prescription to your pharmacy, to add Trulicity.   Please continue the same insulin.  On this type of insulin schedule, you should eat meals on a regular schedule.  If a meal is missed or significantly delayed, your blood sugar could go low.  Please come back for a follow-up appointment in 1 month.

## 2021-03-26 ENCOUNTER — Encounter (HOSPITAL_BASED_OUTPATIENT_CLINIC_OR_DEPARTMENT_OTHER): Payer: BC Managed Care – PPO | Attending: Internal Medicine | Admitting: Internal Medicine

## 2021-03-26 ENCOUNTER — Encounter: Payer: Self-pay | Admitting: Internal Medicine

## 2021-03-26 ENCOUNTER — Other Ambulatory Visit: Payer: Self-pay

## 2021-03-26 ENCOUNTER — Ambulatory Visit (INDEPENDENT_AMBULATORY_CARE_PROVIDER_SITE_OTHER): Payer: BC Managed Care – PPO | Admitting: Internal Medicine

## 2021-03-26 DIAGNOSIS — E11621 Type 2 diabetes mellitus with foot ulcer: Secondary | ICD-10-CM

## 2021-03-26 DIAGNOSIS — Z794 Long term (current) use of insulin: Secondary | ICD-10-CM | POA: Insufficient documentation

## 2021-03-26 DIAGNOSIS — S91301A Unspecified open wound, right foot, initial encounter: Secondary | ICD-10-CM

## 2021-03-26 DIAGNOSIS — L089 Local infection of the skin and subcutaneous tissue, unspecified: Secondary | ICD-10-CM

## 2021-03-26 DIAGNOSIS — E1140 Type 2 diabetes mellitus with diabetic neuropathy, unspecified: Secondary | ICD-10-CM | POA: Insufficient documentation

## 2021-03-26 DIAGNOSIS — L97518 Non-pressure chronic ulcer of other part of right foot with other specified severity: Secondary | ICD-10-CM | POA: Insufficient documentation

## 2021-03-26 DIAGNOSIS — E1151 Type 2 diabetes mellitus with diabetic peripheral angiopathy without gangrene: Secondary | ICD-10-CM | POA: Diagnosis not present

## 2021-03-26 DIAGNOSIS — E1165 Type 2 diabetes mellitus with hyperglycemia: Secondary | ICD-10-CM | POA: Insufficient documentation

## 2021-03-26 DIAGNOSIS — E11628 Type 2 diabetes mellitus with other skin complications: Secondary | ICD-10-CM

## 2021-03-26 DIAGNOSIS — Z940 Kidney transplant status: Secondary | ICD-10-CM | POA: Diagnosis not present

## 2021-03-26 NOTE — Progress Notes (Signed)
FLINT, HAKEEM ([id number]) Visit Report for 03/26/2021 Chief Complaint Document Details Patient Name: Date of Service: Raymond [name] [name] 03/26/2021 12:30 PM Medical Record Number: [id number] Patient Account Number: [id number] Date of Birth/Sex: Treating RN: 23-Sep-1960 (60 y.o. Male) Deon Pilling Primary Care Provider: [name] Other Clinician: Referring Provider: Treating Provider/Extender: Clemon Chambers in Treatment: 0 Information Obtained from: Patient Chief Complaint 02/21/2019; patient is here for review of a ulcer on the tip of the left great toe 03/26/2021; right plantar foot wound Electronic Signature(s) Signed: 03/26/2021 2:34:52 PM By: Kalman Shan DO Entered By: Kalman Shan on 03/26/2021 13:49:52 -------------------------------------------------------------------------------- Debridement Details Patient Name: Date of Service: Raymond [name], Raymond [name] 03/26/2021 12:30 PM Medical Record Number: [id number] Patient Account Number: [id number] Date of Birth/Sex: Treating RN: 11-Aug-1960 (60 y.o. Male) Baruch Gouty Primary Care Provider: [name] Other Clinician: Referring Provider: Treating Provider/Extender: Clemon Chambers in Treatment: 0 Debridement Performed for Assessment: Wound #2 Right Metatarsal head first Performed By: Physician Kalman Shan, DO Debridement Type: Debridement Severity of Tissue Pre Debridement: Fat layer exposed Level of Consciousness (Pre-procedure): Awake and Alert Pre-procedure Verification/Time Out Yes - 13:30 Taken: Start Time: 13:40 Pain Control: Lidocaine 4% T opical Solution T Area Debrided (L x W): otal 3 (cm) x 1.5 (cm) = 4.5 (cm) Tissue and other material debrided: Non-Viable, Callus, Skin: Epidermis Level: Skin/Epidermis Debridement Description: Selective/Open Wound Instrument: Curette Bleeding: Minimum Hemostasis Achieved: Silver Nitrate Procedural Pain:  0 Post Procedural Pain: 0 Response to Treatment: Procedure was tolerated well Level of Consciousness (Post- Awake and Alert procedure): Post Debridement Measurements of Total Wound Length: (cm) 2 Width: (cm) 2.3 Depth: (cm) 0.6 Volume: (cm) 2.168 Character of Wound/Ulcer Post Debridement: Requires Further Debridement Severity of Tissue Post Debridement: Fat layer exposed Post Procedure Diagnosis Same as Pre-procedure Electronic Signature(s) Signed: 03/26/2021 2:34:52 PM By: Kalman Shan DO Signed: 03/26/2021 4:07:03 PM By: Baruch Gouty RN, BSN Entered By: Baruch Gouty on 03/26/2021 13:47:13 -------------------------------------------------------------------------------- HPI Details Patient Name: Date of Service: Raymond [name], Raymond [name] 03/26/2021 12:30 PM Medical Record Number: [id number] Patient Account Number: [id number] Date of Birth/Sex: Treating RN: 09-23-1960 (60 y.o. Male) Deon Pilling Primary Care Provider: [name] Other Clinician: Referring Provider: Treating Provider/Extender: Clemon Chambers in Treatment: 0 History of Present Illness HPI Description: Admission 03/26/2021 Raymond [name] is a 60 year old male with a past medical history of uncontrolled type 2 diabetes on insulin with latest hemoglobin A1c of 15, previous left foot wound with osteomyelitis, peripheral arterial disease, status post kidney transplant, that presents to the clinic for a 1.5-year history of nonhealing wound to the plantar aspect of his right foot. He has been following with Dr. Earleen Newport, podiatry for this issue. He had surgical debridement with bone biopsy on 03/06/2021. Biopsy did not show evidence for osteomyelitis. At that time ACell was placed. He also follows with Dr. Megan Salon for antibiotic therapy and is currently on linezolid and Augmentin. He currently denies systemic signs of infection. He has been using Vaseline to the wound bed. He currently uses a  front offloading shoe. He is following Dr. Loanne Drilling for his diabetes. Electronic Signature(s) Signed: 03/26/2021 2:34:52 PM By: Kalman Shan DO Entered By: Kalman Shan on 03/26/2021 14:04:10 -------------------------------------------------------------------------------- Physical Exam Details Patient Name: Date of Service: Raymond [name], Raymond [name] 03/26/2021 12:30 PM Medical Record Number: [id number] Patient Account Number: [id number] Date of Birth/Sex: Treating RN: 1960-06-21 (60 y.o. Male) Deon Pilling Primary Care Provider: [name] Other Clinician:  Referring Provider: Treating Provider/Extender: Clemon Chambers in Treatment: 0 Constitutional respirations regular, non-labored and within target range for patient.. Cardiovascular 2+ dorsalis pedis/posterior tibialis pulses. Psychiatric pleasant and cooperative. Notes Large circular wound to the first met head with hyper granulated tissue and undermining circumferentially. Surface does not appear healthy. There is a rim of bight blue-green discoloration to the wound bed. Bright blue-green drainage noted on dressing. No purulent drainage. No increased warmth or erythema to the area. Electronic Signature(s) Signed: 03/26/2021 2:34:52 PM By: Kalman Shan DO Entered By: Kalman Shan on 03/26/2021 14:06:40 -------------------------------------------------------------------------------- Physician Orders Details Patient Name: Date of Service: Raymond [name], Raymond [name] 03/26/2021 12:30 PM Medical Record Number: [id number] Patient Account Number: [id number] Date of Birth/Sex: Treating RN: 05-28-1960 (60 y.o. Male) Baruch Gouty Primary Care Provider: [name] Other Clinician: Referring Provider: Treating Provider/Extender: Clemon Chambers in Treatment: 0 Verbal / Phone Orders: No Diagnosis Coding ICD-10 Coding Code Description W26.378H Unspecified open wound, right  foot, initial encounter E11.621 Type 2 diabetes mellitus with foot ulcer E11.40 Type 2 diabetes mellitus with diabetic neuropathy, unspecified Follow-up Appointments ppointment in 1 week. - with Dr. Heber [place] Return A Bathing/ Shower/ Hygiene May shower and wash wound with soap and water. - with dressing changes Off-Loading Wound #2 Right Metatarsal head first Open toe surgical shoe to: - with peg assist insert when available Wedge shoe to: - right foot daily Wound Treatment Wound #2 - Metatarsal head first Wound Laterality: Right Peri-Wound Care: Sween Lotion (Moisturizing lotion) 1 x Per Day/30 Days Discharge Instructions: Apply moisturizing lotion to foot with dressing changes Prim Dressing: KerraCel Ag Gelling Fiber Dressing, 2x2 in (silver alginate) (DME) (Dispense As Written) 1 x Per Day/30 Days ary Discharge Instructions: Apply silver alginate to wound bed as instructed Secondary Dressing: Woven Gauze Sponge, Non-Sterile 4x4 in (DME) (Generic) 1 x Per Day/30 Days Discharge Instructions: Apply over primary dressing as directed. Secondary Dressing: Optifoam Non-Adhesive Dressing, 4x4 in (DME) (Generic) 1 x Per Day/30 Days Discharge Instructions: Apply over primary dressing cut to make foam donut Secured With: Conforming Stretch Gauze Bandage, Sterile 2x75 (in/in) (DME) (Generic) 1 x Per Day/30 Days Discharge Instructions: Secure with stretch gauze as directed. Secured With: Paper Tape, 2x10 (in/yd) (DME) (Generic) 1 x Per Day/30 Days Discharge Instructions: Secure dressing with tape as directed. Patient Medications llergies: No Known Allergies A Notifications Medication Indication Start End 03/26/2021 ciprofloxacin HCl DOSE 1 - oral 750 mg tablet - 1 tablet oral BID x 7 days Electronic Signature(s) Signed: 03/26/2021 2:34:52 PM By: Kalman Shan DO Previous Signature: 03/26/2021 2:09:41 PM Version By: Kalman Shan DO Previous Signature: 03/26/2021 2:09:41 PM Version By:  Kalman Shan DO Entered By: Kalman Shan on 03/26/2021 14:10:48 -------------------------------------------------------------------------------- Problem List Details Patient Name: Date of Service: Raymond [name], Raymond [name] 03/26/2021 12:30 PM Medical Record Number: [id number] Patient Account Number: [id number] Date of Birth/Sex: Treating RN: 02/26/1961 (60 y.o. Male) Deon Pilling Primary Care Provider: [name] Other Clinician: Referring Provider: Treating Provider/Extender: Clemon Chambers in Treatment: 0 Active Problems ICD-10 Encounter Code Description Active Date MDM Diagnosis S91.301A Unspecified open wound, right foot, initial encounter 03/26/2021 No Yes E11.621 Type 2 diabetes mellitus with foot ulcer 03/26/2021 No Yes E11.40 Type 2 diabetes mellitus with diabetic neuropathy, unspecified 03/26/2021 No Yes Inactive Problems Resolved Problems Electronic Signature(s) Signed: 03/26/2021 2:34:52 PM By: Kalman Shan DO Entered By: Kalman Shan on 03/26/2021 13:29:16 -------------------------------------------------------------------------------- Progress Note Details Patient Name: Date of Service: Raymond [name],  Raymond [name] 03/26/2021 12:30 PM Medical Record Number: [id number] Patient Account Number: [id number] Date of Birth/Sex: Treating RN: April 27, 1961 (60 y.o. Male) Deon Pilling Primary Care Provider: [name] Other Clinician: Referring Provider: Treating Provider/Extender: Clemon Chambers in Treatment: 0 Subjective Chief Complaint Information obtained from Patient 02/21/2019; patient is here for review of a ulcer on the tip of the left great toe 03/26/2021; right plantar foot wound History of Present Illness (HPI) Admission 03/26/2021 Mr. Breyton Vanscyoc is a 60 year old male with a past medical history of uncontrolled type 2 diabetes on insulin with latest hemoglobin A1c of 15, previous left foot wound with  osteomyelitis, peripheral arterial disease, status post kidney transplant, that presents to the clinic for a 1.5-year history of nonhealing wound to the plantar aspect of his right foot. He has been following with Dr. Earleen Newport, podiatry for this issue. He had surgical debridement with bone biopsy on 03/06/2021. Biopsy did not show evidence for osteomyelitis. At that time ACell was placed. He also follows with Dr. Megan Salon for antibiotic therapy and is currently on linezolid and Augmentin. He currently denies systemic signs of infection. He has been using Vaseline to the wound bed. He currently uses a front offloading shoe. He is following Dr. Loanne Drilling for his diabetes. Patient History Information obtained from Patient. Allergies No Known Allergies Family History Cancer - Mother,Siblings, Diabetes - Mother,Siblings,Paternal Grandparents, Heart Disease - Paternal Grandparents, Hypertension - Mother,Father,Siblings, Kidney Disease - Mother, No family history of Hereditary Spherocytosis, Lung Disease, Seizures, Stroke, Thyroid Problems, Tuberculosis. Social History Never smoker, Marital Status - Married, Alcohol Use - Rarely, Drug Use - No History, Caffeine Use - Daily. Medical History Eyes Patient has history of Cataracts - bil removed Denies history of Glaucoma, Optic Neuritis Ear/Nose/Mouth/Throat Denies history of Chronic sinus problems/congestion, Middle ear problems Hematologic/Lymphatic Denies history of Anemia, Hemophilia, Human Immunodeficiency Virus, Lymphedema, Sickle Cell Disease Respiratory Denies history of Aspiration, Asthma, Chronic Obstructive Pulmonary Disease (COPD), Pneumothorax, Sleep Apnea, Tuberculosis Cardiovascular Patient has history of Hypertension, Peripheral Arterial Disease Denies history of Angina, Arrhythmia, Congestive Heart Failure, Coronary Artery Disease, Deep Vein Thrombosis, Hypotension, Myocardial Infarction, Peripheral Venous Disease, Phlebitis,  Vasculitis Gastrointestinal Denies history of Cirrhosis , Colitis, Crohnoos, Hepatitis A, Hepatitis B, Hepatitis C Endocrine Patient has history of Type II Diabetes Denies history of Type I Diabetes Genitourinary Denies history of End Stage Renal Disease Immunological Denies history of Lupus Erythematosus, Raynaudoos, Scleroderma Integumentary (Skin) Denies history of History of Burn Musculoskeletal Patient has history of Osteomyelitis Denies history of Gout, Rheumatoid Arthritis, Osteoarthritis Neurologic Patient has history of Neuropathy Denies history of Dementia, Quadriplegia, Paraplegia, Seizure Disorder Oncologic Denies history of Received Chemotherapy, Received Radiation Psychiatric Denies history of Anorexia/bulimia, Confinement Anxiety Hospitalization/Surgery History - bone biopsy. - kidney transplant. - penile pump insertion. - graft application. Medical A Surgical History Notes nd Constitutional Symptoms (General Health) obesity Eyes diabetic retinopathy right eye Cardiovascular hyperlipidemia Genitourinary hx kidney transplant Review of Systems (ROS) Constitutional Symptoms (General Health) Denies complaints or symptoms of Fatigue, Fever, Chills, Marked Weight Change. Eyes Complains or has symptoms of Glasses / Contacts. Denies complaints or symptoms of Dry Eyes, Vision Changes. Ear/Nose/Mouth/Throat Denies complaints or symptoms of Chronic sinus problems or rhinitis. Respiratory Denies complaints or symptoms of Chronic or frequent coughs, Shortness of Breath. Cardiovascular Denies complaints or symptoms of Chest pain. Gastrointestinal Denies complaints or symptoms of Frequent diarrhea, Nausea, Vomiting. Genitourinary Denies complaints or symptoms of Frequent urination. Integumentary (Skin) Complains or has symptoms of Wounds - right foot.  Musculoskeletal Complains or has symptoms of Muscle Weakness. Denies complaints or symptoms of Muscle  Pain. Neurologic Complains or has symptoms of Numbness/parasthesias. Psychiatric Denies complaints or symptoms of Claustrophobia, Suicidal. Objective Constitutional respirations regular, non-labored and within target range for patient.. Vitals Time Taken: 12:58 PM, Height: 68 in, Source: Stated, Weight: 224 lbs, Source: Stated, BMI: 34.1, Temperature: 98.7 F, Pulse: 68 bpm, Respiratory Rate: 18 breaths/min, Blood Pressure: 147/89 mmHg, Capillary Blood Glucose: 348 mg/dl. General Notes: glucose per pt report yseterday Cardiovascular 2+ dorsalis pedis/posterior tibialis pulses. Psychiatric pleasant and cooperative. General Notes: Large circular wound to the first met head with hyper granulated tissue and undermining circumferentially. Surface does not appear healthy. There is a rim of bight blue-green discoloration to the wound bed. Bright blue-green drainage noted on dressing. No purulent drainage. No increased warmth or erythema to the area. Integumentary (Hair, Skin) Wound #2 status is Open. Original cause of wound was Blister. The date acquired was: 05/27/2019. The wound is located on the Right Metatarsal head first. The wound measures 2cm length x 2.3cm width x 0.6cm depth; 3.613cm^2 area and 2.168cm^3 volume. There is Fat Layer (Subcutaneous Tissue) exposed. There is no tunneling noted, however, there is undermining starting at 12:00 and ending at 12:00 with a maximum distance of 0.6cm. There is a medium amount of purulent drainage noted. The wound margin is well defined and not attached to the wound base. There is large (67-100%) red, pink granulation within the wound bed. There is no necrotic tissue within the wound bed. General Notes: Callous periwound Assessment Active Problems ICD-10 Unspecified open wound, right foot, initial encounter Type 2 diabetes mellitus with foot ulcer Type 2 diabetes mellitus with diabetic neuropathy, unspecified Patient presents with a 1.5-year  history of nonhealing wound to right first met head. He has had surgical debridement of the wound with skin substitute placement by Dr. Jacqualyn Posey. No evidence of osteomyelitis at that time. He follows with Dr. Megan Salon and has completed 2 weeks of linezolid and Augmentin. There was bright green drainage in the wound and on the dressing suggestive of Pseudomonas. I will give him a week of ciprofloxacin. I debrided nonviable tissue. I used silver nitrate to the hyper granulated tissue. We had a long discussion about the importance of glycemic control in wound healing. His hemoglobin A1c was 15 on 03/05/2021. I discussed that he is a high risk for amputation. He expressed understanding. He is using a front offloading shoe and I think he would do best with a Pegasys insert at this time. We also discussed doing a total contact cast once his infection is cleared and blood flow status is reassessed. He has follow-up with Dr. Gwenlyn Found for assessment of blood flow status. His latest ABIs were noncompressible in 05/2020. His TBI's were 0.47 on the right. Patient will follow-up in 1 week. We will reassess cast placement at that time. 49 minutes was spent on the encounter including face-to-face, EMR review and coordination of care Procedures Wound #2 Pre-procedure diagnosis of Wound #2 is a Diabetic Wound/Ulcer of the Lower Extremity located on the Right Metatarsal head first .Severity of Tissue Pre Debridement is: Fat layer exposed. There was a Selective/Open Wound Skin/Epidermis Debridement with a total area of 4.5 sq cm performed by Kalman Shan, DO. With the following instrument(s): Curette to remove Non-Viable tissue/material. Material removed includes Callus and Skin: Epidermis and after achieving pain control using Lidocaine 4% Topical Solution. No specimens were taken. A time out was conducted at 13:30, prior to the  start of the procedure. A Minimum amount of bleeding was controlled with Silver Nitrate. The  procedure was tolerated well with a pain level of 0 throughout and a pain level of 0 following the procedure. Post Debridement Measurements: 2cm length x 2.3cm width x 0.6cm depth; 2.168cm^3 volume. Character of Wound/Ulcer Post Debridement requires further debridement. Severity of Tissue Post Debridement is: Fat layer exposed. Post procedure Diagnosis Wound #2: Same as Pre-Procedure Plan Follow-up Appointments: Return Appointment in 1 week. - with Dr. Heber [place] Bathing/ Shower/ Hygiene: May shower and wash wound with soap and water. - with dressing changes Off-Loading: Wound #2 Right Metatarsal head first: Open toe surgical shoe to: - with peg assist insert when available Wedge shoe to: - right foot daily The following medication(s) was prescribed: ciprofloxacin HCl oral 750 mg tablet 1 1 tablet oral BID x 7 days starting 03/26/2021 WOUND #2: - Metatarsal head first Wound Laterality: Right Peri-Wound Care: Sween Lotion (Moisturizing lotion) 1 x Per Day/30 Days Discharge Instructions: Apply moisturizing lotion to foot with dressing changes Prim Dressing: KerraCel Ag Gelling Fiber Dressing, 2x2 in (silver alginate) (DME) (Dispense As Written) 1 x Per Day/30 Days ary Discharge Instructions: Apply silver alginate to wound bed as instructed Secondary Dressing: Woven Gauze Sponge, Non-Sterile 4x4 in (DME) (Generic) 1 x Per Day/30 Days Discharge Instructions: Apply over primary dressing as directed. Secondary Dressing: Optifoam Non-Adhesive Dressing, 4x4 in (DME) (Generic) 1 x Per Day/30 Days Discharge Instructions: Apply over primary dressing cut to make foam donut Secured With: Conforming Stretch Gauze Bandage, Sterile 2x75 (in/in) (DME) (Generic) 1 x Per Day/30 Days Discharge Instructions: Secure with stretch gauze as directed. Secured With: Paper T ape, 2x10 (in/yd) (DME) (Generic) 1 x Per Day/30 Days Discharge Instructions: Secure dressing with tape as directed. 1. In office sharp  debridement 2. Ciprofloxacin 3. Silver alginate 4. Aggressive offloadingooorder Pegasys 5. Follow-up in 1 week Electronic Signature(s) Signed: 03/26/2021 2:34:52 PM By: Kalman Shan DO Entered By: Kalman Shan on 03/26/2021 14:34:09 -------------------------------------------------------------------------------- HxROS Details Patient Name: Date of Service: Raymond [name], Raymond [name] 03/26/2021 12:30 PM Medical Record Number: [id number] Patient Account Number: [id number] Date of Birth/Sex: Treating RN: [birth year] (60 y.o. Male) Baruch Gouty Primary Care Provider: [name] Other Clinician: Referring Provider: Treating Provider/Extender: Clemon Chambers in Treatment: 0 Information Obtained From Patient Constitutional Symptoms (General Health) Complaints and Symptoms: Negative for: Fatigue; Fever; Chills; Marked Weight Change Medical History: Past Medical History Notes: obesity Eyes Complaints and Symptoms: Positive for: Glasses / Contacts Negative for: Dry Eyes; Vision Changes Medical History: Positive for: Cataracts - bil removed Negative for: Glaucoma; Optic Neuritis Past Medical History Notes: diabetic retinopathy right eye Ear/Nose/Mouth/Throat Complaints and Symptoms: Negative for: Chronic sinus problems or rhinitis Medical History: Negative for: Chronic sinus problems/congestion; Middle ear problems Respiratory Complaints and Symptoms: Negative for: Chronic or frequent coughs; Shortness of Breath Medical History: Negative for: Aspiration; Asthma; Chronic Obstructive Pulmonary Disease (COPD); Pneumothorax; Sleep Apnea; Tuberculosis Cardiovascular Complaints and Symptoms: Negative for: Chest pain Medical History: Positive for: Hypertension; Peripheral Arterial Disease Negative for: Angina; Arrhythmia; Congestive Heart Failure; Coronary Artery Disease; Deep Vein Thrombosis; Hypotension; Myocardial Infarction; Peripheral Venous  Disease; Phlebitis; Vasculitis Past Medical History Notes: hyperlipidemia Gastrointestinal Complaints and Symptoms: Negative for: Frequent diarrhea; Nausea; Vomiting Medical History: Negative for: Cirrhosis ; Colitis; Crohns; Hepatitis A; Hepatitis B; Hepatitis C Genitourinary Complaints and Symptoms: Negative for: Frequent urination Medical History: Negative for: End Stage Renal Disease Past Medical History Notes: hx kidney transplant Integumentary (Skin) Complaints and  Symptoms: Positive for: Wounds - right foot Medical History: Negative for: History of Burn Musculoskeletal Complaints and Symptoms: Positive for: Muscle Weakness Negative for: Muscle Pain Medical History: Positive for: Osteomyelitis Negative for: Gout; Rheumatoid Arthritis; Osteoarthritis Neurologic Complaints and Symptoms: Positive for: Numbness/parasthesias Medical History: Positive for: Neuropathy Negative for: Dementia; Quadriplegia; Paraplegia; Seizure Disorder Psychiatric Complaints and Symptoms: Negative for: Claustrophobia; Suicidal Medical History: Negative for: Anorexia/bulimia; Confinement Anxiety Hematologic/Lymphatic Medical History: Negative for: Anemia; Hemophilia; Human Immunodeficiency Virus; Lymphedema; Sickle Cell Disease Endocrine Medical History: Positive for: Type II Diabetes Negative for: Type I Diabetes Time with diabetes: 62 Treated with: Insulin Blood sugar tested every day: No Immunological Medical History: Negative for: Lupus Erythematosus; Raynauds; Scleroderma Oncologic Medical History: Negative for: Received Chemotherapy; Received Radiation HBO Extended History Items Eyes: Cataracts Immunizations Pneumococcal Vaccine: Received Pneumococcal Vaccination: No Implantable Devices None Hospitalization / Surgery History Type of Hospitalization/Surgery bone biopsy kidney transplant penile pump insertion graft application Family and Social History Cancer: Yes  - Mother,Siblings; Diabetes: Yes - Mother,Siblings,Paternal Grandparents; Heart Disease: Yes - Paternal Grandparents; Hereditary Spherocytosis: No; Hypertension: Yes - Mother,Father,Siblings; Kidney Disease: Yes - Mother; Lung Disease: No; Seizures: No; Stroke: No; Thyroid Problems: No; Tuberculosis: No; Never smoker; Marital Status - Married; Alcohol Use: Rarely; Drug Use: No History; Caffeine Use: Daily; Financial Concerns: No; Food, Clothing or Shelter Needs: No; Support System Lacking: No; Transportation Concerns: No Electronic Signature(s) Signed: 03/26/2021 2:34:52 PM By: Kalman Shan DO Signed: 03/26/2021 4:07:03 PM By: Baruch Gouty RN, BSN Entered By: Baruch Gouty on 03/26/2021 13:06:39 -------------------------------------------------------------------------------- [place] Details Patient Name: Date of Service: Raymond [name], Raymond [name] 03/26/2021 Medical Record Number: [id number] Patient Account Number: [id number] Date of Birth/Sex: Treating RN: 05-24-1961 (60 y.o. Male) Baruch Gouty Primary Care Provider: [name] Other Clinician: Referring Provider: Treating Provider/Extender: Clemon Chambers in Treatment: 0 Diagnosis Coding ICD-10 Codes Code Description S93.734K Unspecified open wound, right foot, initial encounter E11.621 Type 2 diabetes mellitus with foot ulcer E11.40 Type 2 diabetes mellitus with diabetic neuropathy, unspecified Facility Procedures CPT4 Code: 87681157 Description: [place] VISIT-LEV 3 EST PT Modifier: 25 Quantity: 1 CPT4 Code: 26203559 Description: 74163 - [place]BR[place] WOUND 1ST 20 SQ CM OR < ICD-10 Diagnosis Description E11.621 Type 2 diabetes mellitus with foot ulcer Modifier: Quantity: 1 Physician Procedures : CPT4 Code Description Modifier [id number] - WC PHYS LEVEL 4 - EST PT ICD-10 Diagnosis Description S91.301A Unspecified open wound, right foot, initial encounter E11.621 Type 2 diabetes mellitus  with foot ulcer E11.40 Type 2 diabetes mellitus with  diabetic neuropathy, unspecified Quantity: 1 : [id number] - WC PHYS DEBR WO ANESTH 20 SQ CM ICD-10 Diagnosis Description E11.621 Type 2 diabetes mellitus with foot ulcer Quantity: 1 Electronic Signature(s) Signed: 03/26/2021 2:34:52 PM By: Kalman Shan DO Entered By: Kalman Shan on 03/26/2021 14:34:26

## 2021-03-26 NOTE — Progress Notes (Signed)
[facility] for Infectious Disease  Patient Active Problem List   Diagnosis Date Noted   Diabetic foot infection ([place])     Priority: High   Peripheral vascular disease ([place]) 04/24/2020   Pyogenic inflammation of bone (HCC)    Thrombophlebitis of superficial veins of right lower extremity    Difficult intravenous access    MRSA bacteremia    Infected wound 01/12/2020   Critical lower limb ischemia ([place]) 02/02/2019   Personal history of noncompliance with medical treatment, presenting hazards to health 10/07/2018   Critical limb ischemia with history of revascularization of same extremity ([place]) 07/27/2018   Mixed hyperlipidemia 02/19/2018   Class 1 obesity due to excess calories with serious comorbidity and body mass index (BMI) of 31.0 to 31.9 in adult 02/19/2018   History of renal transplant 02/19/2018   Bilateral impacted cerumen 08/31/2017   Conductive hearing loss, bilateral 08/31/2017   Nuclear sclerotic cataract of right eye 10/07/2016   Pseudophakia of left eye 10/07/2016   Hypertension due to endocrine disorder 03/04/2016   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 ([place]) 03/04/2016   Renal failure 03/04/2016   Proliferative diabetic retinopathy with macular edema associated with type 2 diabetes mellitus ([place]) 03/04/2016   Acute kidney injury ([place]) 12/10/2015   Immunosuppression ([place]) 12/10/2015   Nausea vomiting and diarrhea 12/10/2015   Diabetes ([place]) 12/10/2015   Essential hypertension, benign 12/10/2015   ARF (acute renal failure) ([place]) 12/10/2015   Pancytopenia ([place]) 12/10/2015   Encounter for screening colonoscopy 11/15/2014   GERD (gastroesophageal reflux disease) 11/15/2014    Patient's Medications  New Prescriptions   No medications on file  Previous Medications   AMOXICILLIN-CLAVULANATE (AUGMENTIN) 875-125 MG TABLET    Take 1 tablet by mouth 2 (two) times daily.   ATORVASTATIN  (LIPITOR) 10 MG TABLET    Take 1 tablet (10 mg total) by mouth daily.   BLOOD GLUCOSE METER KIT AND SUPPLIES KIT    Dispense based on patient and insurance preference. Use up to four times daily as directed. (FOR ICD-9 250.00, 250.01).   BLOOD GLUCOSE MONITORING SUPPL (ACCU-CHEK GUIDE ME) W/DEVICE KIT    1 Piece by Does not apply route as directed.   BRIMONIDINE (ALPHAGAN) 0.2 % OPHTHALMIC SOLUTION    Apply to eye.   CLONIDINE (CATAPRES - DOSED IN MG/24 HR) 0.3 MG/24HR PATCH    Place 0.3 mg onto the skin once a week.   CONTINUOUS BLOOD GLUC RECEIVER (FREESTYLE LIBRE 2 READER) DEVI    As directed   CONTINUOUS BLOOD GLUC SENSOR (FREESTYLE LIBRE 2 SENSOR) MISC    1 Piece by Does not apply route every 14 (fourteen) days.   DORZOLAMIDE-TIMOLOL (COSOPT) 22.3-6.8 MG/ML OPHTHALMIC SOLUTION    Apply to eye.   DOXAZOSIN (CARDURA) 4 MG TABLET    Take 6 mg by mouth daily.   DULAGLUTIDE (TRULICITY) 6.64 QI/3.4VQ SOPN    Inject 0.75 mg into the skin once a week.   GLIPIZIDE (GLUCOTROL XL) 5 MG 24 HR TABLET    Take 5 mg by mouth daily.    GLUCOSE BLOOD (ACCU-CHEK GUIDE) TEST STRIP    Use as instructed 4 x daily. E11.65   HYDRALAZINE (APRESOLINE) 25 MG TABLET    Take 25 mg by mouth 2 (two) times daily.   HYDROCODONE-ACETAMINOPHEN (NORCO/VICODIN) 5-325 MG TABLET    Take 1 tablet by mouth every 6 (six) hours as needed.   INSULIN  NPH, HUMAN,, ISOPHANE, (NOVOLIN N FLEXPEN) 100 UNIT/ML KIWKPEN    Inject 100 Units into the skin every morning.   LATANOPROST (XALATAN) 0.005 % OPHTHALMIC SOLUTION    Apply to eye.   LINEZOLID (ZYVOX) 600 MG TABLET    Take 1 tablet (600 mg total) by mouth 2 (two) times daily.   MULTIPLE VITAMIN (MULTIVITAMIN) CAPSULE    Take 1 capsule by mouth daily.   SILVER SULFADIAZINE (SILVADENE) 1 % CREAM    Apply 1 application topically daily.   SODIUM BICARBONATE 650 MG TABLET    Take 1,300 mg by mouth 3 (three) times daily.   TACROLIMUS (PROGRAF) 1 MG CAPSULE    Take 4 mg by mouth 2 (two) times  daily. Take 3 capsules (77m) in AM & take 3 caps (35m in PM   TRIAMCINOLONE (KENALOG) 0.025 % OINTMENT    Apply 1 application topically 3 (three) times daily.   VITAMIN D, ERGOCALCIFEROL, (DRISDOL) 50000 UNITS CAPS CAPSULE    Take 50,000 Units by mouth every 30 (thirty) days.  Modified Medications   No medications on file  Discontinued Medications   No medications on file    Subjective: [place]ers[place]s in for his routine follow-up visit.  He has diabetes and peripheral neuropathy.  He has had problems off and on with diabetic foot ulcers.  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  He was seen by my partners and treated with a long course of IV daptomycin.  He tells me that his ulcers are improved greatly and was almost completely closed earlier this year.  He saw Dr. WaJacqualyn Poseyn August of this year, noting that the wound appeared to be getting worse.  He has had some debridements.  Blood work on 01/31/2021 showed that his C-reactive protein was normal and that his sedimentation rate was only slightly elevated at 25.  A wound culture on 02/12/2021 was reported to grow a "mix of organisms".  An MRI done on 02/22/2021 showed edema in the lateral sesamoid concerning for possible osteomyelitis and right first metatarsal joint fusion without obvious septic arthritis or osteomyelitis.  He underwent operative debridement on 03/06/2021.  Operative gram stain showed no organisms and cultures grew only rare normal skin flora.  Pathology report indicated that "osteomyelitis was not identified".  Biopsies were done of both the right first metatarsal head and the right first proximal phalanx.  He was started on doxycycline 2 postop.    I switched him to oral linezolid and amoxicillin clavulanate to provide broader coverage when I saw him on 03/13/2021.  He says that he has tolerated those antibiotics well.  He says that he was seen in the wound clinic  today (question by Dr. JeKalman Shan  He said that he believes that his wound has been improving.  His staples have been removed.  He does not think that any new wound cultures have been obtained but he says that a new antibiotic was prescribed today.  We called his pharmacy and learned that he was prescribed ciprofloxacin for 7 days.  Review of Systems: Review of Systems  Constitutional:  Negative for fever.  Gastrointestinal:  Negative for abdominal pain, diarrhea, nausea and vomiting.  Musculoskeletal:  Negative for joint pain.   Past Medical History:  Diagnosis Date   Chronic kidney disease    Awaiting transplant   Diabetes ([facility]   Diabetes mellitus without complication ([facility]   Hypertension  Renal disorder    Renal insufficiency     Social History   Tobacco Use   Smoking status: Never   Smokeless tobacco: Never  Vaping Use   Vaping Use: Never used  Substance Use Topics   Alcohol use: No    Alcohol/week: 0.0 standard drinks   Drug use: No    Family History  Problem Relation Age of Onset   Diabetes Mother    Heart disease Father    Colon cancer Neg Hx     No Known Allergies  Objective: Vitals:   03/26/21 1443  BP: (!) 175/90  Pulse: 68  Temp: (!) 97.3 F (36.3 C)  TempSrc: Temporal  SpO2: 98%   There is no height or weight on file to calculate BMI.  Physical Exam Constitutional:      Comments: He is calm and pleasant.  Cardiovascular:     Rate and Rhythm: Normal rate.  Pulmonary:     Effort: Pulmonary effort is normal.  Musculoskeletal:     Comments: His foot was just redressed at the wound center so I did not examine his ulcer today.  Psychiatric:        Mood and Affect: Mood normal.    Lab Results    Problem List Items Addressed This Visit       High   Diabetic foot infection ([place])    I will need to see if I can obtain records from the wound center to see why ciprofloxacin was added to his regimen today.  I will get repeat blood  work today and see him back in 2 to 3 weeks.      Relevant Orders   CBC   Basic metabolic panel   C-reactive protein   Sedimentation rate     Michel Bickers, MD [facility] [place] [phone] pager   [phone] cell 03/26/2021, 3:51 PM

## 2021-03-26 NOTE — Assessment & Plan Note (Signed)
I will need to see if I can obtain records from the wound center to see why ciprofloxacin was added to his regimen today.  I will get repeat blood work today and see him back in 2 to 3 weeks.

## 2021-03-26 NOTE — Progress Notes (Signed)
GOKU, HARB ([id number]) Visit Report for 03/26/2021 Abuse/Suicide Risk Screen Details Patient Name: Date of Service: [name] RO LD 03/26/2021 12:30 PM Medical Record Number: [id number] Patient Account Number: [id number] Date of Birth/Sex: Treating RN: 1960/06/11 (60 y.o. Male) Baruch Gouty Primary Care Lorri Fukuhara: [name] Other Clinician: Referring Rien Marland: Treating Raima Geathers/Extender: Clemon Chambers in Treatment: 0 Abuse/Suicide Risk Screen Items Answer ABUSE RISK SCREEN: Has anyone close to you tried to hurt or harm you recentlyo No Do you feel uncomfortable with anyone in your familyo No Has anyone forced you do things that you didnt want to doo No Electronic Signature(s) Signed: 03/26/2021 4:07:03 PM By: Baruch Gouty RN, BSN Entered By: Baruch Gouty on 03/26/2021 13:06:48 -------------------------------------------------------------------------------- Activities of Daily Living Details Patient Name: Date of Service: [facility] MB, [name] 03/26/2021 12:30 PM Medical Record Number: [id number] Patient Account Number: [id number] Date of Birth/Sex: Treating RN: [birth year] (60 y.o. Male) Baruch Gouty Primary Care Argenis Kumari: [name] Other Clinician: Referring Camillia Marcy: Treating Korbin Notaro/Extender: Clemon Chambers in Treatment: 0 Activities of Daily Living Items Answer Activities of Daily Living (Please select one for each item) Drive Automobile Completely Able T Medications ake Completely Able Use T elephone Completely Able Care for Appearance Completely Able Use T oilet Completely Able Bath / Shower Completely Able Dress Self Completely Able Feed Self Completely Able Walk Completely Able Get In / Out Bed Completely Able Housework Completely Able Prepare Meals Completely Able Handle Money Completely Able Shop for Self Completely Able Electronic Signature(s) Signed: 03/26/2021 4:07:03 PM By:  Baruch Gouty RN, BSN Entered By: Baruch Gouty on 03/26/2021 13:07:12 -------------------------------------------------------------------------------- Education Screening Details Patient Name: Date of Service: [name], [name] 03/26/2021 12:30 PM Medical Record Number: [id number] Patient Account Number: [id number] Date of Birth/Sex: Treating RN: [birth year] (60 y.o. Male) Baruch Gouty Primary Care Kaynen Minner: [name] Other Clinician: Referring Nicola Quesnell: Treating Jurnie Garritano/Extender: Clemon Chambers in Treatment: 0 Primary Learner Assessed: Patient Learning Preferences/Education Level/Primary Language Learning Preference: Explanation, Demonstration, Printed Material Highest Education Level: High School Preferred Language: English Cognitive Barrier Language Barrier: No Translator Needed: No Memory Deficit: No Emotional Barrier: No Cultural/Religious Beliefs Affecting Medical Care: No Physical Barrier Impaired Vision: Yes Glasses Impaired Hearing: No Decreased Hand dexterity: No Knowledge/Comprehension Knowledge Level: High Comprehension Level: High Ability to understand written instructions: High Ability to understand verbal instructions: High Motivation Anxiety Level: Calm Cooperation: Cooperative Education Importance: Acknowledges Need Interest in Health Problems: Asks Questions Perception: Coherent Willingness to Engage in Self-Management High Activities: Readiness to Engage in Self-Management High Activities: Electronic Signature(s) Signed: 03/26/2021 4:07:03 PM By: Baruch Gouty RN, BSN Entered By: Baruch Gouty on 03/26/2021 13:07:45 -------------------------------------------------------------------------------- Fall Risk Assessment Details Patient Name: Date of Service: [name], [name] 03/26/2021 12:30 PM Medical Record Number: [id number] Patient Account Number: [id number] Date of Birth/Sex: Treating RN: Jan 11, 1961  (60 y.o. Male) Baruch Gouty Primary Care Zeven Kocak: [name] Other Clinician: Referring Rayen Palen: Treating Maysoon Lozada/Extender: Clemon Chambers in Treatment: 0 Fall Risk Assessment Items Have you had 2 or more falls in the last 12 monthso 0 No Have you had any fall that resulted in injury in the last 12 monthso 0 No FALLS RISK SCREEN History of falling - immediate or within 3 months 0 No Secondary diagnosis (Do you have 2 or more medical diagnoseso) 0 No Ambulatory aid None/bed rest/wheelchair/nurse 0 Yes Crutches/cane/walker 0 No Furniture 0 No Intravenous therapy Access/Saline/Heparin Lock 0 No Gait/Transferring Normal/ bed rest/ wheelchair 0 Yes  Weak (short steps with or without shuffle, stooped but able to lift head while walking, may seek 0 No support from furniture) Impaired (short steps with shuffle, may have difficulty arising from chair, head down, impaired 0 No balance) Mental Status Oriented to own ability 0 Yes Electronic Signature(s) Signed: 03/26/2021 4:07:03 PM By: Baruch Gouty RN, BSN Entered By: Baruch Gouty on 03/26/2021 13:08:01 -------------------------------------------------------------------------------- Foot Assessment Details Patient Name: Date of Service: [name], [name] 03/26/2021 12:30 PM Medical Record Number: [id number] Patient Account Number: [id number] Date of Birth/Sex: Treating RN: 1960/12/11 (60 y.o. Male) Baruch Gouty Primary Care Haidar Muse: [name] Other Clinician: Referring Coralee Edberg: Treating Tabitha Tupper/Extender: Clemon Chambers in Treatment: 0 Foot Assessment Items Site Locations + = Sensation present, - = Sensation absent, C = Callus, U = Ulcer R = Redness, W = Warmth, M = Maceration, PU = Pre-ulcerative lesion F = Fissure, S = Swelling, D = Dryness Assessment Right: Left: Other Deformity: No No Prior Foot Ulcer: No Yes Prior Amputation: No No Charcot Joint:  No No Ambulatory Status: Ambulatory Without Help Gait: Steady Electronic Signature(s) Signed: 03/26/2021 4:07:03 PM By: Baruch Gouty RN, BSN Entered By: Baruch Gouty on 03/26/2021 13:09:02 -------------------------------------------------------------------------------- Nutrition Risk Screening Details Patient Name: Date of Service: [name], [name] 03/26/2021 12:30 PM Medical Record Number: [id number] Patient Account Number: [id number] Date of Birth/Sex: Treating RN: 03-11-1961 (60 y.o. Male) Baruch Gouty Primary Care Milinda Sweeney: [name] Other Clinician: Referring Sol Englert: Treating Rosland Riding/Extender: Clemon Chambers in Treatment: 0 Height (in): 68 Weight (lbs): 224 Body Mass Index (BMI): 34.1 Nutrition Risk Screening Items Score Screening NUTRITION RISK SCREEN: I have an illness or condition that made me change the kind and/or amount of food I eat 0 No I eat fewer than two meals per day 0 No I eat few fruits and vegetables, or milk products 0 No I have three or more drinks of beer, liquor or wine almost every day 0 No I have tooth or mouth problems that make it hard for me to eat 0 No I don't always have enough money to buy the food I need 0 No I eat alone most of the time 0 No I take three or more different prescribed or over-the-counter drugs a day 1 Yes Without wanting to, I have lost or gained 10 pounds in the last six months 2 Yes I am not always physically able to shop, cook and/or feed myself 0 No Nutrition Protocols Good Risk Protocol Provide education on elevated blood Moderate Risk Protocol 0 sugars and impact on wound healing, as applicable High Risk Proctocol Risk Level: Moderate Risk Score: 3 Electronic Signature(s) Signed: 03/26/2021 4:07:03 PM By: Baruch Gouty RN, BSN Entered By: Baruch Gouty on 03/26/2021 13:08:33

## 2021-03-27 ENCOUNTER — Ambulatory Visit: Payer: BC Managed Care – PPO | Admitting: Internal Medicine

## 2021-03-27 LAB — BASIC METABOLIC PANEL
BUN: 20 mg/dL (ref 7–25)
CO2: 25 mmol/L (ref 20–32)
Calcium: 9.9 mg/dL (ref 8.6–10.3)
Chloride: 106 mmol/L (ref 98–110)
Creat: 1.3 mg/dL (ref 0.70–1.35)
Glucose, Bld: 157 mg/dL — ABNORMAL HIGH (ref 65–99)
Potassium: 5.5 mmol/L — ABNORMAL HIGH (ref 3.5–5.3)
Sodium: 138 mmol/L (ref 135–146)

## 2021-03-27 LAB — CBC
HCT: 44.2 % (ref 38.5–50.0)
Hemoglobin: 13.8 g/dL (ref 13.2–17.1)
MCH: 25.7 pg — ABNORMAL LOW (ref 27.0–33.0)
MCHC: 31.2 g/dL — ABNORMAL LOW (ref 32.0–36.0)
MCV: 82.3 fL (ref 80.0–100.0)
MPV: 12.5 fL (ref 7.5–12.5)
Platelets: 118 10*3/uL — ABNORMAL LOW (ref 140–400)
RBC: 5.37 10*6/uL (ref 4.20–5.80)
RDW: 13.6 % (ref 11.0–15.0)
WBC: 4.5 10*3/uL (ref 3.8–10.8)

## 2021-03-27 LAB — SEDIMENTATION RATE: Sed Rate: 39 mm/h — ABNORMAL HIGH (ref 0–20)

## 2021-03-27 LAB — C-REACTIVE PROTEIN: CRP: 9.6 mg/L — ABNORMAL HIGH (ref <8.0)

## 2021-03-28 ENCOUNTER — Encounter (HOSPITAL_BASED_OUTPATIENT_CLINIC_OR_DEPARTMENT_OTHER): Payer: BC Managed Care – PPO | Admitting: Internal Medicine

## 2021-03-28 NOTE — Progress Notes (Signed)
Raymond [name], HUBERT ([id number]) Visit Report for 03/26/2021 Allergy List Details Patient Name: Date of Service: [name] RO LD 03/26/2021 12:30 PM Medical Record Number: [id number] Patient Account Number: [id number] Date of Birth/Sex: Treating RN: [birth year] (59 y.o. Male) Baruch Gouty Primary Care Mariusz Jubb: [name] Other Clinician: Referring Cecilio Ohlrich: Treating Maday Guarino/Extender: Clemon Chambers in Treatment: 0 Allergies Active Allergies No Known Allergies Allergy Notes Electronic Signature(s) Signed: 03/26/2021 4:07:03 PM By: Baruch Gouty RN, BSN Entered By: Baruch Gouty on 03/26/2021 12:59:36 -------------------------------------------------------------------------------- Arrival Information Details Patient Name: Date of Service: Raymond [name], [name] 03/26/2021 12:30 PM Medical Record Number: [id number] Patient Account Number: [id number] Date of Birth/Sex: Treating RN: 1961/03/08 (60 y.o. Male) Baruch Gouty Primary Care Lyden Redner: [name] Other Clinician: Referring Tekeya Geffert: Treating Lezlie Ritchey/Extender: Clemon Chambers in Treatment: 0 Visit Information Patient Arrived: Ambulatory Arrival Time: 12:55 Accompanied By: self Transfer Assistance: None Patient Identification Verified: Yes Secondary Verification Process Completed: Yes Patient Requires Transmission-Based Precautions: No Patient Has Alerts: No History Since Last Visit Has Footwear/Offloading in Place as Prescribed: Yes Right: Wedge Shoe Pain Present Now: No Electronic Signature(s) Signed: 03/26/2021 4:07:03 PM By: Baruch Gouty RN, BSN Entered By: Baruch Gouty on 03/26/2021 12:57:57 -------------------------------------------------------------------------------- Encounter Discharge Information Details Patient Name: Date of Service: Raymond [name], [name] 03/26/2021 12:30 PM Medical Record Number: [id number] Patient Account Number:  [id number] Date of Birth/Sex: Treating RN: May 05, 1961 (60 y.o. Male) Baruch Gouty Primary Care Dionne Knoop: [name] Other Clinician: Referring Jesusmanuel Erbes: Treating Nonna Renninger/Extender: Clemon Chambers in Treatment: 0 Encounter Discharge Information Items Post Procedure Vitals Discharge Condition: Stable Temperature (F): 98.7 Ambulatory Status: Ambulatory Pulse (bpm): 68 Discharge Destination: Home Respiratory Rate (breaths/min): 18 Transportation: Private Auto Blood Pressure (mmHg): 147/89 Accompanied By: self Schedule Follow-up Appointment: Yes Clinical Summary of Care: Patient Declined Electronic Signature(s) Signed: 03/26/2021 4:07:03 PM By: Baruch Gouty RN, BSN Entered By: Baruch Gouty on 03/26/2021 14:05:21 -------------------------------------------------------------------------------- Lower Extremity Assessment Details Patient Name: Date of Service: [facility] MB, [name] 03/26/2021 12:30 PM Medical Record Number: [id number] Patient Account Number: [id number] Date of Birth/Sex: Treating RN: Sep 24, 1960 (59 y.o. Male) Deon Pilling Primary Care Rozann Holts: [name] Other Clinician: Referring Madix Blowe: Treating Percy Comp/Extender: Clemon Chambers in Treatment: 0 Edema Assessment Assessed: Shirlyn Goltz: No] [Right: Yes] Edema: [Left: Ye] [Right: s] Calf Left: Right: Point of Measurement: 35 cm From Medial Instep 38.5 cm Ankle Left: Right: Point of Measurement: 12 cm From Medial Instep 23 cm Knee To Floor Left: Right: From Medial Instep 45 cm Vascular Assessment Pulses: Dorsalis Pedis Palpable: [Right:Yes] Posterior Tibial Palpable: [Right:Yes] Electronic Signature(s) Signed: 03/28/2021 6:11:26 PM By: Deon Pilling RN, BSN Entered By: Deon Pilling on 03/26/2021 13:09:12 -------------------------------------------------------------------------------- Multi Wound Chart Details Patient Name: Date of  Service: Raymond [name], [name] 03/26/2021 12:30 PM Medical Record Number: [id number] Patient Account Number: [id number] Date of Birth/Sex: Treating RN: Aug 15, 1960 (60 y.o. Male) Deon Pilling Primary Care Omaria Plunk: [name] Other Clinician: Referring Izan Miron: Treating Lyana Asbill/Extender: Clemon Chambers in Treatment: 0 Vital Signs Height(in): 16 Capillary Blood Glucose(mg/dl): 348 Weight(lbs): 224 Pulse(bpm): 72 Body Mass Index(BMI): 34 Blood Pressure(mmHg): 147/89 Temperature(F): 98.7 Respiratory Rate(breaths/min): 18 Photos: [N/A:N/A] Right Metatarsal head first N/A N/A Wound Location: Blister N/A N/A Wounding Event: Diabetic Wound/Ulcer of the Lower N/A N/A Primary Etiology: Extremity Cataracts, Hypertension, Peripheral N/A N/A Comorbid History: Arterial Disease, Type II Diabetes, Osteomyelitis, Neuropathy 05/27/2019 N/A N/A Date Acquired: 0 N/A N/A Weeks of Treatment: Open N/A N/A Wound Status:  2x2.3x0.6 N/A N/A Measurements L x W x D (cm) 3.613 N/A N/A A (cm) : rea 2.168 N/A N/A Volume (cm) : 12 Starting Position 1 (o'clock): 12 Ending Position 1 (o'clock): 0.6 Maximum Distance 1 (cm): Yes N/A N/A Undermining: Grade 2 N/A N/A Classification: Medium N/A N/A Exudate A mount: Purulent N/A N/A Exudate Type: yellow, brown, green N/A N/A Exudate Color: Well defined, not attached N/A N/A Wound Margin: Large (67-100%) N/A N/A Granulation A mount: Red, Pink N/A N/A Granulation Quality: None Present (0%) N/A N/A Necrotic A mount: Fat Layer (Subcutaneous Tissue): Yes N/A N/A Exposed Structures: Fascia: No Tendon: No Muscle: No Joint: No Bone: No None N/A N/A Epithelialization: Callous periwound N/A N/A Assessment Notes: Treatment Notes Electronic Signature(s) Signed: 03/26/2021 2:34:52 PM By: Kalman Shan DO Signed: 03/28/2021 6:11:26 PM By: Deon Pilling RN, BSN Entered By: Kalman Shan on 03/26/2021  13:29:24 -------------------------------------------------------------------------------- Multi-Disciplinary Care Plan Details Patient Name: Date of Service: [facility] MB, [name] 03/26/2021 12:30 PM Medical Record Number: [id number] Patient Account Number: [id number] Date of Birth/Sex: Treating RN: 1960-11-29 (60 y.o. Male) Baruch Gouty Primary Care Keila Turan: [name] Other Clinician: Referring Chrissa Meetze: Treating Rhapsody Wolven/Extender: Clemon Chambers in Treatment: 0 Multidisciplinary Care Plan reviewed with physician Active Inactive Nutrition Nursing Diagnoses: Impaired glucose control: actual or potential Potential for alteratiion in Nutrition/Potential for imbalanced nutrition Goals: Patient/caregiver verbalizes understanding of need to maintain therapeutic glucose control per primary care physician Date Initiated: 03/26/2021 Target Resolution Date: 04/23/2021 Goal Status: Active Patient/caregiver will maintain therapeutic glucose control Date Initiated: 03/26/2021 Target Resolution Date: 04/23/2021 Goal Status: Active Interventions: Assess HgA1c results as ordered upon admission and as needed Provide education on elevated blood sugars and impact on wound healing Treatment Activities: Dietary management education, guidance and counseling : 03/26/2021 Special diet education : 03/26/2021 Notes: Wound/Skin Impairment Nursing Diagnoses: Impaired tissue integrity Knowledge deficit related to ulceration/compromised skin integrity Goals: Patient/caregiver will verbalize understanding of skin care regimen Date Initiated: 03/26/2021 Target Resolution Date: 04/23/2021 Goal Status: Active Ulcer/skin breakdown will have a volume reduction of 30% by week 4 Date Initiated: 03/26/2021 Target Resolution Date: 04/23/2021 Goal Status: Active Interventions: Assess patient/caregiver ability to obtain necessary supplies Assess patient/caregiver ability to perform  ulcer/skin care regimen upon admission and as needed Assess ulceration(s) every visit Provide education on ulcer and skin care Treatment Activities: Skin care regimen initiated : 03/26/2021 Topical wound management initiated : 03/26/2021 Notes: Electronic Signature(s) Signed: 03/26/2021 4:07:03 PM By: Baruch Gouty RN, BSN Entered By: Baruch Gouty on 03/26/2021 13:11:47 -------------------------------------------------------------------------------- Pain Assessment Details Patient Name: Date of Service: Raymond [name], [name] 03/26/2021 12:30 PM Medical Record Number: [id number] Patient Account Number: [id number] Date of Birth/Sex: Treating RN: January 15, 1961 (60 y.o. Male) Baruch Gouty Primary Care Talis Iwan: [name] Other Clinician: Referring Lacie Landry: Treating Micaylah Bertucci/Extender: Clemon Chambers in Treatment: 0 Active Problems Location of Pain Severity and Description of Pain Patient Has Paino No Site Locations Rate the pain. Current Pain Level: 0 Pain Management and Medication Current Pain Management: Electronic Signature(s) Signed: 03/26/2021 4:07:03 PM By: Baruch Gouty RN, BSN Entered By: Baruch Gouty on 03/26/2021 13:09:21 -------------------------------------------------------------------------------- Patient/Caregiver Education Details Patient Name: Date of Service: Raymond [name], [name] 11/1/2022andnbsp12:30 PM Medical Record Number: [id number] Patient Account Number: [id number] Date of Birth/Gender: Treating RN: May 19, 1961 (60 y.o. Male) Baruch Gouty Primary Care Physician: [name] Other Clinician: Referring Physician: Treating Physician/Extender: Clemon Chambers in Treatment: 0 Education Assessment Education Provided To: Patient Education Topics Provided Elevated  Blood Sugar/ Impact on Healing: Handouts: Elevated Blood Sugars: How Do They Affect Wound Healing Methods: Explain/Verbal,  Printed Responses: Reinforcements needed, State content correctly Offloading: Handouts: What is Offloadingo Methods: Explain/Verbal, Printed Responses: Reinforcements needed, State content correctly Wound/Skin Impairment: Handouts: Caring for Your Ulcer, Skin Care Do's and Dont's Methods: Explain/Verbal, Printed Responses: Reinforcements needed, State content correctly Electronic Signature(s) Signed: 03/26/2021 4:07:03 PM By: Baruch Gouty RN, BSN Entered By: Baruch Gouty on 03/26/2021 13:16:01 -------------------------------------------------------------------------------- Wound Assessment Details Patient Name: Date of Service: [name], [name] 03/26/2021 12:30 PM Medical Record Number: [id number] Patient Account Number: [id number] Date of Birth/Sex: Treating RN: 04-30-1961 (60 y.o. Male) Deon Pilling Primary Care Markeisha Mancias: [name] Other Clinician: Referring Gerre Ranum: Treating Abbegail Matuska/Extender: Clemon Chambers in Treatment: 0 Wound Status Wound Number: 2 Primary Diabetic Wound/Ulcer of the Lower Extremity Etiology: Wound Location: Right Metatarsal head first Wound Open Wounding Event: Blister Status: Date Acquired: 05/27/2019 Comorbid Cataracts, Hypertension, Peripheral Arterial Disease, Type II Weeks Of Treatment: 0 History: Diabetes, Osteomyelitis, Neuropathy Clustered Wound: No Photos Wound Measurements Length: (cm) 2 Width: (cm) 2.3 Depth: (cm) 0.6 Area: (cm) 3.613 Volume: (cm) 2.168 % Reduction in Area: % Reduction in Volume: Epithelialization: None Tunneling: No Undermining: Yes Starting Position (o'clock): 12 Ending Position (o'clock): 12 Maximum Distance: (cm) 0.6 Wound Description Classification: Grade 2 Wound Margin: Well defined, not attached Exudate Amount: Medium Exudate Type: Purulent Exudate Color: yellow, brown, green Foul Odor After Cleansing: No Slough/Fibrino No Wound Bed Granulation Amount: Large  (67-100%) Exposed Structure Granulation Quality: Red, Pink Fascia Exposed: No Necrotic Amount: None Present (0%) Fat Layer (Subcutaneous Tissue) Exposed: Yes Tendon Exposed: No Muscle Exposed: No Joint Exposed: No Bone Exposed: No Assessment Notes Callous periwound Treatment Notes Wound #2 (Metatarsal head first) Wound Laterality: Right Cleanser Peri-Wound Care Sween Lotion (Moisturizing lotion) Discharge Instruction: Apply moisturizing lotion to foot with dressing changes Topical Primary Dressing KerraCel Ag Gelling Fiber Dressing, 2x2 in (silver alginate) Discharge Instruction: Apply silver alginate to wound bed as instructed Secondary Dressing Woven Gauze Sponge, Non-Sterile 4x4 in Discharge Instruction: Apply over primary dressing as directed. Optifoam Non-Adhesive Dressing, 4x4 in Discharge Instruction: Apply over primary dressing cut to make foam donut Secured With Conforming Stretch Gauze Bandage, Sterile 2x75 (in/in) Discharge Instruction: Secure with stretch gauze as directed. Paper Tape, 2x10 (in/yd) Discharge Instruction: Secure dressing with tape as directed. Compression Wrap Compression Stockings Add-Ons Electronic Signature(s) Signed: 03/28/2021 6:11:26 PM By: Deon Pilling RN, BSN Entered By: Deon Pilling on 03/26/2021 13:08:23 -------------------------------------------------------------------------------- Vitals Details Patient Name: Date of Service: [name], [name] 03/26/2021 12:30 PM Medical Record Number: [id number] Patient Account Number: [id number] Date of Birth/Sex: Treating RN: 03/25/1961 (60 y.o. Male) Baruch Gouty Primary Care Bela Bonaparte: [name] Other Clinician: Referring Kazue Cerro: Treating Tamira Ryland/Extender: Clemon Chambers in Treatment: 0 Vital Signs Time Taken: 12:58 Temperature (F): 98.7 Height (in): 68 Pulse (bpm): 68 Source: Stated Respiratory Rate (breaths/min): 18 Weight (lbs): 224 Blood  Pressure (mmHg): 147/89 Source: Stated Capillary Blood Glucose (mg/dl): 348 Body Mass Index (BMI): 34.1 Reference Range: 80 - 120 mg / dl Notes glucose per pt report yseterday Electronic Signature(s) Signed: 03/26/2021 4:07:03 PM By: Baruch Gouty RN, BSN Entered By: Baruch Gouty on 03/26/2021 12:58:46

## 2021-03-29 ENCOUNTER — Ambulatory Visit (INDEPENDENT_AMBULATORY_CARE_PROVIDER_SITE_OTHER): Payer: BC Managed Care – PPO | Admitting: Podiatry

## 2021-03-29 ENCOUNTER — Other Ambulatory Visit: Payer: Self-pay

## 2021-03-29 DIAGNOSIS — I739 Peripheral vascular disease, unspecified: Secondary | ICD-10-CM

## 2021-03-29 DIAGNOSIS — L97512 Non-pressure chronic ulcer of other part of right foot with fat layer exposed: Secondary | ICD-10-CM

## 2021-03-29 DIAGNOSIS — M8600 Acute hematogenous osteomyelitis, unspecified site: Secondary | ICD-10-CM

## 2021-04-01 ENCOUNTER — Ambulatory Visit (HOSPITAL_COMMUNITY)
Admission: RE | Admit: 2021-04-01 | Payer: BC Managed Care – PPO | Source: Ambulatory Visit | Attending: Cardiovascular Disease | Admitting: Cardiovascular Disease

## 2021-04-01 NOTE — Progress Notes (Signed)
Subjective: Raymond [name] is a 60 y.o. is seen today in office s/p right foot bone biopsy, wound debridement with ACell application preformed on 03/06/2021.  Since I last saw him he follow-up with Dr. Megan Salon with infectious disease and is currently on Zyvox.  He is also follow-up with the wound care center as well.  He has a follow-up to see Dr. Gwenlyn Found for circulation.  Denies any fevers or chills.  Is been changing the bandage as per wound care instructions.  He has no other concerns today.   Specimen Description WOUND RIGHT FOOT PROXIMAL PHALANX  Performed at [facility], [place] [address], [place], [place] [address]   Special Requests NONE  Performed at [facility], [place] [address], [place], [place] [address]   Gram Stain NO WBC SEEN  NO ORGANISMS SEEN   Culture RARE NORMAL SKIN FLORA  NO GROUP A STREP (S.PYOGENES) ISOLATED  NO STAPHYLOCOCCUS AUREUS ISOLATED  NO ANAEROBES ISOLATED  Performed at [facility], [place] [address] [place], [place] [address]   Report Status 03/11/2021 FINAL   ----- Specimen Description TISSUE RIGHT FOOT METATARSAL  Performed at [place] [address] [place], [place] [address]   Special Requests NONE  Performed at [facility], [place] [address] [place], [place] [address]   Gram Stain NO WBC SEEN  NO ORGANISMS SEEN   Culture No growth aerobically or anaerobically.  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 RIGHT FOOT, METATARSAL:  - Bone, osteomyelitis not identified.   B. RIGHT FOOT, PROXIMAL, PHALANX:  - Bone, osteomyelitis not identified.  -----  Objective: General: No acute distress, AAOx3  DP/PT pulses  palpable CRT < 3 sec to all digits.  Right foot: Bandages intact.  Granular wound is present with about the same in size.  Minimal surrounding edema there is no surrounding erythema, ascending cellulitis there is no fluctuance crepitation there is no malodor.  There is no exposed bone or tendon.  No undermining.  No pain with calf compression, swelling, warmth, erythema.   Assessment and Plan:  Status post right foot wound debridement, ACell application with bone biopsy, doing well with no complications   -Treatment options discussed including all alternatives, risks, and complications -At this point, to defer further wound care to the wound care center.  Continue follow-up with infectious disease he also has an appointment to see Dr. Gwenlyn Found for circulation.  If he needs anything to let me know I will be happy to see him back.  Encourage glucose control, offloading.  Trula Slade DPM

## 2021-04-02 ENCOUNTER — Other Ambulatory Visit: Payer: Self-pay

## 2021-04-02 ENCOUNTER — Ambulatory Visit (HOSPITAL_COMMUNITY)
Admission: RE | Admit: 2021-04-02 | Discharge: 2021-04-02 | Disposition: A | Discharge status: Home / Self Care | Payer: BC Managed Care – PPO | Source: Ambulatory Visit | Attending: Cardiology | Admitting: Cardiology

## 2021-04-02 ENCOUNTER — Encounter (HOSPITAL_BASED_OUTPATIENT_CLINIC_OR_DEPARTMENT_OTHER): Payer: BC Managed Care – PPO | Admitting: Internal Medicine

## 2021-04-02 DIAGNOSIS — L97512 Non-pressure chronic ulcer of other part of right foot with fat layer exposed: Secondary | ICD-10-CM | POA: Insufficient documentation

## 2021-04-02 DIAGNOSIS — I70229 Atherosclerosis of native arteries of extremities with rest pain, unspecified extremity: Secondary | ICD-10-CM | POA: Diagnosis present

## 2021-04-02 DIAGNOSIS — I70221 Atherosclerosis of native arteries of extremities with rest pain, right leg: Secondary | ICD-10-CM | POA: Diagnosis not present

## 2021-04-03 ENCOUNTER — Ambulatory Visit (INDEPENDENT_AMBULATORY_CARE_PROVIDER_SITE_OTHER): Payer: BC Managed Care – PPO | Admitting: Cardiovascular Disease

## 2021-04-03 ENCOUNTER — Encounter: Payer: Self-pay | Admitting: Cardiovascular Disease

## 2021-04-03 VITALS — BP 128/65 | HR 68 | Ht 68.0 in | Wt 217.6 lb

## 2021-04-03 DIAGNOSIS — E782 Mixed hyperlipidemia: Secondary | ICD-10-CM

## 2021-04-03 DIAGNOSIS — Z9889 Other specified postprocedural states: Secondary | ICD-10-CM

## 2021-04-03 DIAGNOSIS — I10 Essential (primary) hypertension: Secondary | ICD-10-CM

## 2021-04-03 DIAGNOSIS — I70229 Atherosclerosis of native arteries of extremities with rest pain, unspecified extremity: Secondary | ICD-10-CM | POA: Diagnosis not present

## 2021-04-03 NOTE — Assessment & Plan Note (Signed)
History of hyperlipidemia on statin therapy with lipid profile performed 12/11/2020 revealing total cholesterol 148, LDL of 87 and HDL 39.

## 2021-04-03 NOTE — Patient Instructions (Signed)
Medication Instructions:  Your physician recommends that you continue on your current medications as directed. Please refer to the Current Medication list given to you today.  *If you need a refill on your cardiac medications before your next appointment, please call your pharmacy*   Follow-Up: At [facility], you and your health needs are our priority.  As part of our continuing mission to provide you with exceptional heart care, we have created designated Provider Care Teams.  These Care Teams include your primary Cardiologist (physician) and Advanced Practice Providers (APPs -  Physician Assistants and Nurse Practitioners) who all work together to provide you with the care you need, when you need it.  We recommend signing up for the patient portal called "MyChart".  Sign up information is provided on this After Visit Summary.  MyChart is used to connect with patients for Virtual Visits (Telemedicine).  Patients are able to view lab/test results, encounter notes, upcoming appointments, etc.  Non-urgent messages can be sent to your provider as well.   To learn more about what you can do with MyChart, go to [email or web address].    Your next appointment:   2 month(s)  The format for your next appointment:   In Person  Provider:   Jonathan Berry, MD  

## 2021-04-03 NOTE — Assessment & Plan Note (Signed)
History of essential hypertension with blood pressure measured today at 120/65.  He is on a clonidine patch in addition to hydralazine.

## 2021-04-03 NOTE — Assessment & Plan Note (Signed)
History of critical limb ischemia with a nonhealing wound at the head of his right first metatarsal.  This has been treated by Dr. Jacqualyn Posey and has been referred to the wound care center as well.  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  He has had a kidney transplant back in 2016 and has a serum creatinine of 1.3.  Dr. Jacqualyn Posey has spoken to his nephrologist who feels that he can be exposed to contrast if absolutely necessary for limb salvage.  We will continue to follow him closely as an outpatient reserving the decision to intervene based on Dr. Jacqualyn Posey and the wound care center's progress and wound healing.

## 2021-04-03 NOTE — Progress Notes (Signed)
04/03/2021 Raymond [name]   Feb 26, 1961  [id number]  Primary Physician [name], FNP Primary Cardiologist: Lorretta Harp MD FACP, [place], [place], [place]  HPI:  Raymond [name] is a 60 y.o.  moderately overweight separated African-American male father of 2 children, grandfather of 2 grandchildren who I last saw in the office 09/28/2020.  He was was referred by Dr. Jacqualyn Posey, his podiatrist for peripheral vascular valuation because of a small ulcer on the plantar surface of his right first metatarsal.  He has a history of treated hypertension, diabetes and hyperlipidemia.  His father had an MI at age 35 and his brother recently had a myocardial infarction as well.  He is never had a heart attack or stroke.  He denies chest pain or shortness of breath.  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  He apparently stepped on a nail approximately 2 to 3 months ago and developed a wound on the plantar surface of his right first metatarsal which Dr. Jacqualyn Posey has been treating fairly frequently over the last several months.  Apparently this is slowly healing.  He had Dopplers performed in [place] recently that were told showed normal circulation.  Since I saw him 6 months ago the wound on his right foot is healed.  He is developed a new wound on his left foot with recent Dopplers that showed a left TBI 0.28, monophasic waveforms in his left SFA with tibial vessel disease, occluded anterior tibial and peroneal artery.  I suspect he will need a amputation of that toe since he has documented osteomyelitis according to Dr. Jacqualyn Posey.  Is unclear whether this will heal but I suspect based on his Dopplers that this will be difficult.  I am also concerned that angiography will lead to radiocontrast nephropathy and jeopardize his transplanted kidney.   He saw Dr. Jacqualyn Posey  who felt that his wound was unlikely  to heal and that he would require amputation.  Dr. Jacqualyn Posey spoke to the patient's nephrologist echoing my concern about exposure to radiocontrast in the setting of moderate renal insufficiency status post renal artery transplant and apparently the nephrologist felt comfortable proceeding with peripheral angiography potential intervention.   Since I saw him 6 months ago he did have his wound debrided by Drs. Wagoner and Regal.  He was referred to the wound care center in addition for further care.  His Doppler study showed patent SFA and popliteal with diseased tibial vessels.  He otherwise is stable, he denies chest pain or shortness of breath.  We will continue to follow him closely as an outpatient reserving intervention for failure to heal.   Current Meds  Medication Sig   amoxicillin-clavulanate (AUGMENTIN) 875-125 MG tablet Take 1 tablet by mouth 2 (two) times daily.   atorvastatin (LIPITOR) 10 MG tablet Take 1 tablet (10 mg total) by mouth daily.   blood glucose meter kit and supplies KIT Dispense based on patient and insurance preference. Use up to four times daily as directed. (FOR ICD-9 250.00, 250.01).   Blood Glucose Monitoring Suppl (ACCU-CHEK GUIDE ME) w/Device KIT 1 Piece by Does not apply route as directed.   brimonidine (ALPHAGAN) 0.2 % ophthalmic solution Apply to eye.   ciprofloxacin (CIPRO) 750 MG tablet Take 750 mg by mouth 2 (two) times daily.   cloNIDine (CATAPRES - DOSED IN MG/24 HR) 0.3 mg/24hr patch Place 0.3 mg onto the  skin once a week.   Continuous Blood Gluc Receiver (FREESTYLE LIBRE 2 READER) DEVI As directed   Continuous Blood Gluc Sensor (FREESTYLE LIBRE 2 SENSOR) MISC 1 Piece by Does not apply route every 14 (fourteen) days.   dorzolamide-timolol (COSOPT) 22.3-6.8 MG/ML ophthalmic solution Apply to eye.   doxazosin (CARDURA) 4 MG tablet Take 6 mg by mouth daily.   Dulaglutide (TRULICITY) 4.26 ST/4.1DQ SOPN Inject 0.75 mg into the skin once a week.   glipiZIDE  (GLUCOTROL XL) 5 MG 24 hr tablet Take 5 mg by mouth daily.    glucose blood (ACCU-CHEK GUIDE) test strip Use as instructed 4 x daily. E11.65   hydrALAZINE (APRESOLINE) 25 MG tablet Take 25 mg by mouth 2 (two) times daily.   HYDROcodone-acetaminophen (NORCO/VICODIN) 5-325 MG tablet Take 1 tablet by mouth every 6 (six) hours as needed.   Insulin NPH, Human,, Isophane, (NOVOLIN N FLEXPEN) 100 UNIT/ML Kiwkpen Inject 100 Units into the skin every morning.   latanoprost (XALATAN) 0.005 % ophthalmic solution Apply to eye.   linezolid (ZYVOX) 600 MG tablet Take 1 tablet (600 mg total) by mouth 2 (two) times daily.   Multiple Vitamin (MULTIVITAMIN) capsule Take 1 capsule by mouth daily.   silver sulfADIAZINE (SILVADENE) 1 % cream Apply 1 application topically daily.   sodium bicarbonate 650 MG tablet Take 1,300 mg by mouth 3 (three) times daily.   tacrolimus (PROGRAF) 1 MG capsule Take 4 mg by mouth 2 (two) times daily. Take 3 capsules (91m) in AM & take 3 caps (344m in PM   triamcinolone (KENALOG) 0.025 % ointment Apply 1 application topically 3 (three) times daily.   Vitamin D, Ergocalciferol, (DRISDOL) 50000 units CAPS capsule Take 50,000 Units by mouth every 30 (thirty) days.     No Known Allergies  Social History   Socioeconomic History   Marital status: Married    Spouse name: Not on file   Number of children: Not on file   Years of education: Not on file   Highest education level: Not on file  Occupational History   Not on file  Tobacco Use   Smoking status: Never   Smokeless tobacco: Never  Vaping Use   Vaping Use: Never used  Substance and Sexual Activity   Alcohol use: No    Alcohol/week: 0.0 standard drinks   Drug use: No   Sexual activity: Not on file  Other Topics Concern   Not on file  Social History Narrative   ** Merged History Encounter **       Social Determinants of Health   Financial Resource Strain: Not on file  Food Insecurity: Not on file  Transportation  Needs: Not on file  Physical Activity: Not on file  Stress: Not on file  Social Connections: Not on file  Intimate Partner Violence: Not on file     Review of Systems: General: negative for chills, fever, night sweats or weight changes.  Cardiovascular: negative for chest pain, dyspnea on exertion, edema, orthopnea, palpitations, paroxysmal nocturnal dyspnea or shortness of breath Dermatological: negative for rash Respiratory: negative for cough or wheezing Urologic: negative for hematuria Abdominal: negative for nausea, vomiting, diarrhea, bright red blood per rectum, melena, or hematemesis Neurologic: negative for visual changes, syncope, or dizziness All other systems reviewed and are otherwise negative except as noted above.    Blood pressure 128/65, pulse 68, height '5\' 8"'  (1.727 m), weight 217 lb 9.6 oz (98.7 kg), SpO2 98 %.  General appearance: alert and no distress Neck:  no adenopathy, no JVD, supple, symmetrical, trachea midline, thyroid not enlarged, symmetric, no tenderness/mass/nodules, and left carotid bruit versus transmitted murmur from his AV fistula. Lungs: clear to auscultation bilaterally Heart: regular rate and rhythm, S1, S2 normal, no murmur, click, rub or gallop Extremities: extremities normal, atraumatic, no cyanosis or edema Pulses: Diminished pedal pulses Skin: Skin color, texture, turgor normal. No rashes or lesions or right first metatarsal head diabetic ulcer Neurologic: Grossly normal  EKG sinus rhythm at 68 with early repolarization changes.  I personally reviewed this EKG.  ASSESSMENT AND PLAN:   Essential hypertension, benign History of essential hypertension with blood pressure measured today at 120/65.  He is on a clonidine patch in addition to hydralazine.  Mixed hyperlipidemia History of hyperlipidemia on statin therapy with lipid profile performed 12/11/2020 revealing total cholesterol 148, LDL of 87 and HDL 39.  Critical limb ischemia with  history of revascularization of same extremity History of critical limb ischemia with a nonhealing wound at the head of his right first metatarsal.  This has been treated by Dr. Jacqualyn Posey and has been referred to the wound care center as well.  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  He has had a kidney transplant back in 2016 and has a serum creatinine of 1.3.  Dr. Jacqualyn Posey has spoken to his nephrologist who feels that he can be exposed to contrast if absolutely necessary for limb salvage.  We will continue to follow him closely as an outpatient reserving the decision to intervene based on Dr. Jacqualyn Posey and the wound care center's progress and wound healing.     Lorretta Harp MD FACP,FACC,FAHA, [facility] 04/03/2021 9:51 AM

## 2021-04-04 ENCOUNTER — Encounter: Payer: BC Managed Care – PPO | Admitting: Podiatry

## 2021-04-16 NOTE — Telephone Encounter (Signed)
Error message

## 2021-04-17 ENCOUNTER — Ambulatory Visit (INDEPENDENT_AMBULATORY_CARE_PROVIDER_SITE_OTHER): Payer: BC Managed Care – PPO | Admitting: Internal Medicine

## 2021-04-17 ENCOUNTER — Other Ambulatory Visit: Payer: Self-pay

## 2021-04-17 ENCOUNTER — Encounter: Payer: Self-pay | Admitting: Internal Medicine

## 2021-04-17 DIAGNOSIS — L089 Local infection of the skin and subcutaneous tissue, unspecified: Secondary | ICD-10-CM

## 2021-04-17 DIAGNOSIS — E11628 Type 2 diabetes mellitus with other skin complications: Secondary | ICD-10-CM

## 2021-04-17 NOTE — Progress Notes (Signed)
[facility] for Infectious Disease  Patient Active Problem List   Diagnosis Date Noted   Diabetic foot infection ([place])     Priority: High   Peripheral vascular disease ([place]) 04/24/2020   Pyogenic inflammation of bone (HCC)    Thrombophlebitis of superficial veins of right lower extremity    Difficult intravenous access    MRSA bacteremia    Infected wound 01/12/2020   Critical lower limb ischemia ([place]) 02/02/2019   Personal history of noncompliance with medical treatment, presenting hazards to health 10/07/2018   Critical limb ischemia with history of revascularization of same extremity ([place]) 07/27/2018   Mixed hyperlipidemia 02/19/2018   Class 1 obesity due to excess calories with serious comorbidity and body mass index (BMI) of 31.0 to 31.9 in adult 02/19/2018   History of renal transplant 02/19/2018   Bilateral impacted cerumen 08/31/2017   Conductive hearing loss, bilateral 08/31/2017   Nuclear sclerotic cataract of right eye 10/07/2016   Pseudophakia of left eye 10/07/2016   Hypertension due to endocrine disorder 03/04/2016   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 ([place]) 03/04/2016   Renal failure 03/04/2016   Proliferative diabetic retinopathy with macular edema associated with type 2 diabetes mellitus ([place]) 03/04/2016   Acute kidney injury ([place]) 12/10/2015   Immunosuppression ([place]) 12/10/2015   Nausea vomiting and diarrhea 12/10/2015   Diabetes ([place]) 12/10/2015   Essential hypertension, benign 12/10/2015   ARF (acute renal failure) ([place]) 12/10/2015   Pancytopenia ([place]) 12/10/2015   Encounter for screening colonoscopy 11/15/2014   GERD (gastroesophageal reflux disease) 11/15/2014    Patient's Medications  New Prescriptions   No medications on file  Previous Medications   ATORVASTATIN (LIPITOR) 10 MG TABLET    Take 1 tablet (10 mg total) by mouth daily.   BLOOD GLUCOSE METER KIT AND  SUPPLIES KIT    Dispense based on patient and insurance preference. Use up to four times daily as directed. (FOR ICD-9 250.00, 250.01).   BLOOD GLUCOSE MONITORING SUPPL (ACCU-CHEK GUIDE ME) W/DEVICE KIT    1 Piece by Does not apply route as directed.   BRIMONIDINE (ALPHAGAN) 0.2 % OPHTHALMIC SOLUTION    Apply to eye.   CLONIDINE (CATAPRES - DOSED IN MG/24 HR) 0.3 MG/24HR PATCH    Place 0.3 mg onto the skin once a week.   CONTINUOUS BLOOD GLUC RECEIVER (FREESTYLE LIBRE 2 READER) DEVI    As directed   CONTINUOUS BLOOD GLUC SENSOR (FREESTYLE LIBRE 2 SENSOR) MISC    1 Piece by Does not apply route every 14 (fourteen) days.   DORZOLAMIDE-TIMOLOL (COSOPT) 22.3-6.8 MG/ML OPHTHALMIC SOLUTION    Apply to eye.   DOXAZOSIN (CARDURA) 4 MG TABLET    Take 6 mg by mouth daily.   DULAGLUTIDE (TRULICITY) 2.11 HE/1.7EY SOPN    Inject 0.75 mg into the skin once a week.   GLIPIZIDE (GLUCOTROL XL) 5 MG 24 HR TABLET    Take 5 mg by mouth daily.    GLUCOSE BLOOD (ACCU-CHEK GUIDE) TEST STRIP    Use as instructed 4 x daily. E11.65   HYDRALAZINE (APRESOLINE) 25 MG TABLET    Take 25 mg by mouth 2 (two) times daily.   HYDROCODONE-ACETAMINOPHEN (NORCO/VICODIN) 5-325 MG TABLET    Take 1 tablet by mouth every 6 (six) hours as needed.   INSULIN NPH, HUMAN,, ISOPHANE, (NOVOLIN N FLEXPEN) 100 UNIT/ML KIWKPEN    Inject 100 Units into the skin every  morning.   LATANOPROST (XALATAN) 0.005 % OPHTHALMIC SOLUTION    Apply to eye.   MULTIPLE VITAMIN (MULTIVITAMIN) CAPSULE    Take 1 capsule by mouth daily.   SILVER SULFADIAZINE (SILVADENE) 1 % CREAM    Apply 1 application topically daily.   SODIUM BICARBONATE 650 MG TABLET    Take 1,300 mg by mouth 3 (three) times daily.   TACROLIMUS (PROGRAF) 1 MG CAPSULE    Take 4 mg by mouth 2 (two) times daily. Take 3 capsules (67m) in AM & take 3 caps (386m in PM   TRIAMCINOLONE (KENALOG) 0.025 % OINTMENT    Apply 1 application topically 3 (three) times daily.   VITAMIN D, ERGOCALCIFEROL, (DRISDOL)  50000 UNITS CAPS CAPSULE    Take 50,000 Units by mouth every 30 (thirty) days.  Modified Medications   No medications on file  Discontinued Medications   AMOXICILLIN-CLAVULANATE (AUGMENTIN) 875-125 MG TABLET    Take 1 tablet by mouth 2 (two) times daily.   CIPROFLOXACIN (CIPRO) 750 MG TABLET    Take 750 mg by mouth 2 (two) times daily.   LINEZOLID (ZYVOX) 600 MG TABLET    Take 1 tablet (600 mg total) by mouth 2 (two) times daily.    Subjective: Ha[facility] in for his routine follow-up visit.  He has diabetes and peripheral neuropathy.  He has had problems off and on with diabetic foot ulcers.  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  He was seen by my partners and treated with a long course of IV daptomycin.  He tells me that his ulcers are improved greatly and was almost completely closed earlier this year.  He saw Dr. WaJacqualyn Poseyn August of this year, noting that the wound appeared to be getting worse.  He has had some debridements.  Blood work on 01/31/2021 showed that his C-reactive protein was normal and that his sedimentation rate was only slightly elevated at 25.  A wound culture on 02/12/2021 was reported to grow a "mix of organisms".  An MRI done on 02/22/2021 showed edema in the lateral sesamoid concerning for possible osteomyelitis and right first metatarsal joint fusion without obvious septic arthritis or osteomyelitis.  He underwent operative debridement on 03/06/2021.  Operative gram stain showed no organisms and cultures grew only rare normal skin flora.  Pathology report indicated that "osteomyelitis was not identified".  Biopsies were done of both the right first metatarsal head and the right first proximal phalanx.  He was started on doxycycline 2 postop.    I switched him to oral linezolid and amoxicillin clavulanate to provide broader coverage when I saw him on 03/13/2021.  He says that he has tolerated those  antibiotics well.  When I saw him on 03/26/2021, Dr. HoHeber Carolinahis wound care provider had just started him on a 7-day course of ciprofloxacin.  He remains on antibiotics.  He is not having any wound drainage or odor.  He has not had any fever, nausea or vomiting.  He has had some mild, intermittent diarrhea.  He does not have any follow-up arranged with wound care at this time.  Review of Systems: Review of Systems  Constitutional:  Negative for fever.  Gastrointestinal:  Positive for diarrhea. Negative for abdominal pain, nausea and vomiting.  Musculoskeletal:  Negative for joint pain.   Past Medical History:  Diagnosis Date   Chronic kidney disease    Awaiting transplant   Diabetes ([place]  Diabetes mellitus without complication (HCC)    Hypertension    Renal disorder    Renal insufficiency     Social History   Tobacco Use   Smoking status: Never   Smokeless tobacco: Never  Vaping Use   Vaping Use: Never used  Substance Use Topics   Alcohol use: No    Alcohol/week: 0.0 standard drinks   Drug use: No    Family History  Problem Relation Age of Onset   Diabetes Mother    Heart disease Father    Colon cancer Neg Hx     No Known Allergies  Objective: Vitals:   04/17/21 0920  BP: (!) 192/92  Pulse: 66  Temp: (!) 96.7 F (35.9 C)  TempSrc: Temporal  Weight: 226 lb (102.5 kg)   Body mass index is 34.36 kg/m.  Physical Exam Constitutional:      Comments: He is calm and pleasant.  Cardiovascular:     Rate and Rhythm: Normal rate.  Pulmonary:     Effort: Pulmonary effort is normal.  Musculoskeletal:     Comments: His foot was just redressed at the wound center so I did not examine his ulcer today.  He has a quarter sized plantar ulcer under his right first metatarsal head.  There is no surrounding cellulitis or fluctuance.  There is no drainage or odor.  There is no exposed bone.  Psychiatric:        Mood and Affect: Mood normal.     Lab Results    Problem  List Items Addressed This Visit       High   Diabetic foot infection ([place])    That his deep infection has been cured.  He will stop antibiotics now and follow-up in 4 weeks.  We have given him information about how to contact the wound center to arrange a follow-up visit.       Michel Bickers, MD [facility] [phone] pager   [phone] cell 04/17/2021, 9:55 AM

## 2021-04-17 NOTE — Assessment & Plan Note (Signed)
That his deep infection has been cured.  He will stop antibiotics now and follow-up in 4 weeks.  We have given him information about how to contact the wound center to arrange a follow-up visit.

## 2021-04-18 LAB — CBC WITH DIFFERENTIAL/PLATELET
Absolute Monocytes: 349 cells/uL (ref 200–950)
Basophils Absolute: 38 cells/uL (ref 0–200)
Basophils Relative: 0.9 %
Eosinophils Absolute: 181 cells/uL (ref 15–500)
Eosinophils Relative: 4.3 %
HCT: 44.9 % (ref 38.5–50.0)
Hemoglobin: 13.9 g/dL (ref 13.2–17.1)
Lymphs Abs: 1491 cells/uL (ref 850–3900)
MCH: 25.5 pg — ABNORMAL LOW (ref 27.0–33.0)
MCHC: 31 g/dL — ABNORMAL LOW (ref 32.0–36.0)
MCV: 82.2 fL (ref 80.0–100.0)
MPV: 12.1 fL (ref 7.5–12.5)
Monocytes Relative: 8.3 %
Neutro Abs: 2142 cells/uL (ref 1500–7800)
Neutrophils Relative %: 51 %
Platelets: 131 10*3/uL — ABNORMAL LOW (ref 140–400)
RBC: 5.46 10*6/uL (ref 4.20–5.80)
RDW: 13.6 % (ref 11.0–15.0)
Total Lymphocyte: 35.5 %
WBC: 4.2 10*3/uL (ref 3.8–10.8)

## 2021-04-18 LAB — CK: Total CK: 84 U/L (ref 44–196)

## 2021-04-18 LAB — C-REACTIVE PROTEIN: CRP: 2.7 mg/L (ref <8.0)

## 2021-04-18 LAB — SEDIMENTATION RATE: Sed Rate: 29 mm/h — ABNORMAL HIGH (ref 0–20)

## 2021-04-23 ENCOUNTER — Encounter (HOSPITAL_BASED_OUTPATIENT_CLINIC_OR_DEPARTMENT_OTHER): Payer: BC Managed Care – PPO | Admitting: Internal Medicine

## 2021-04-23 ENCOUNTER — Other Ambulatory Visit: Payer: Self-pay

## 2021-04-23 DIAGNOSIS — E11621 Type 2 diabetes mellitus with foot ulcer: Secondary | ICD-10-CM | POA: Diagnosis not present

## 2021-04-23 NOTE — Progress Notes (Signed)
Raymond [name], Raymond [name] ([id number]) Visit Report for [name] Arrival Information Details Patient Name: Date of Service: Raymond [name], Raymond [name] Medical Record Number: [id number] Patient Account Number: [id number] Date of Birth/Sex: Treating RN: 1960/07/22 (60 y.o. [name], [name] Primary Care Ladena Jacquez: [name] Other Clinician: Referring Tarica Harl: Treating Joycelin Radloff/Extender: Lynden Ang in Treatment: 4 Visit Information History Since Last Visit Added or deleted any medications: No Patient Arrived: Ambulatory Any new allergies or adverse reactions: No Arrival Time: 10:26 Had a fall or experienced change in No Accompanied By: self activities of daily living that may affect Transfer Assistance: None risk of falls: Patient Identification Verified: Yes Signs or symptoms of abuse/neglect since last visito No Secondary Verification Process Completed: Yes Hospitalized since last visit: No Patient Requires Transmission-Based Precautions: No Implantable device outside of the clinic excluding No Patient Has Alerts: No cellular tissue based products placed in the center since last visit: Has Dressing in Place as Prescribed: Yes Pain Present Now: No Electronic Signature(s) Signed: 04/23/2021 2:31:13 PM By: Valeria Batman EMT Entered By: Valeria Batman on [name] 10:40:27 -------------------------------------------------------------------------------- Clinic Level of Care Assessment Details Patient Name: Date of Service: [facility] [name], Raymond [name] [name] 9:45 A [name] Medical Record Number: [id number] Patient Account Number: [id number] Date of Birth/Sex: Treating RN: December 08, 1960 (60 y.o. [name] Primary Care Maquita Sandoval: [name] Other Clinician: Referring Kayloni Rocco: Treating Marvelous Bouwens/Extender: Lynden Ang in Treatment: 4 Clinic Level of Care Assessment Items TOOL 4 Quantity Score X- 1 0 Use when only an  EandM is performed on FOLLOW-UP visit ASSESSMENTS - Nursing Assessment / Reassessment X- 1 10 Reassessment of Co-morbidities (includes updates in patient status) X- 1 5 Reassessment of Adherence to Treatment Plan ASSESSMENTS - Wound and Skin A ssessment / Reassessment X - Simple Wound Assessment / Reassessment - one wound 1 5 '[]'  - 0 Complex Wound Assessment / Reassessment - multiple wounds '[]'  - 0 Dermatologic / Skin Assessment (not related to wound area) ASSESSMENTS - Focused Assessment '[]'  - 0 Circumferential Edema Measurements - multi extremities '[]'  - 0 Nutritional Assessment / Counseling / Intervention X- 1 5 Lower Extremity Assessment (monofilament, tuning fork, pulses) '[]'  - 0 Peripheral Arterial Disease Assessment (using hand held doppler) ASSESSMENTS - Ostomy and/or Continence Assessment and Care '[]'  - 0 Incontinence Assessment and Management '[]'  - 0 Ostomy Care Assessment and Management (repouching, etc.) PROCESS - Coordination of Care X - Simple Patient / Family Education for ongoing care 1 15 '[]'  - 0 Complex (extensive) Patient / Family Education for ongoing care X- 1 10 Staff obtains Programmer, systems, Records, T Results / Process Orders est '[]'  - 0 Staff telephones HHA, Nursing Homes / Clarify orders / etc '[]'  - 0 Routine Transfer to another Facility (non-emergent condition) '[]'  - 0 Routine Hospital Admission (non-emergent condition) '[]'  - 0 New Admissions / Biomedical engineer / Ordering NPWT Apligraf, etc. , '[]'  - 0 Emergency Hospital Admission (emergent condition) X- 1 10 Simple Discharge Coordination '[]'  - 0 Complex (extensive) Discharge Coordination PROCESS - Special Needs '[]'  - 0 Pediatric / Minor Patient Management '[]'  - 0 Isolation Patient Management '[]'  - 0 Hearing / Language / Visual special needs '[]'  - 0 Assessment of Community assistance (transportation, D/C planning, etc.) '[]'  - 0 Additional assistance / Altered mentation '[]'  - 0 Support Surface(s)  Assessment (bed, cushion, seat, etc.) INTERVENTIONS - Wound Cleansing / Measurement X - Simple Wound Cleansing - one wound 1 5 '[]'  - 0 Complex Wound Cleansing -  multiple wounds X- 1 5 Wound Imaging (photographs - any number of wounds) '[]'  - 0 Wound Tracing (instead of photographs) X- 1 5 Simple Wound Measurement - one wound '[]'  - 0 Complex Wound Measurement - multiple wounds INTERVENTIONS - Wound Dressings X - Small Wound Dressing one or multiple wounds 1 10 '[]'  - 0 Medium Wound Dressing one or multiple wounds '[]'  - 0 Large Wound Dressing one or multiple wounds '[]'  - 0 Application of Medications - topical '[]'  - 0 Application of Medications - injection INTERVENTIONS - Miscellaneous '[]'  - 0 External ear exam '[]'  - 0 Specimen Collection (cultures, biopsies, blood, body fluids, etc.) '[]'  - 0 Specimen(s) / Culture(s) sent or taken to Lab for analysis '[]'  - 0 Patient Transfer (multiple staff / Civil Service fast streamer / Similar devices) '[]'  - 0 Simple Staple / Suture removal (25 or less) '[]'  - 0 Complex Staple / Suture removal (26 or more) '[]'  - 0 Hypo / Hyperglycemic Management (close monitor of Blood Glucose) '[]'  - 0 Ankle / Brachial Index (ABI) - do not check if billed separately X- 1 5 Vital Signs Has the patient been seen at the hospital within the last three years: Yes Total Score: 90 Level Of Care: New/Established - Level 3 Electronic Signature(s) Signed: 04/23/2021 5:45:41 PM By: Levan Hurst RN, BSN Entered By: Levan Hurst on [name] 17:20:45 -------------------------------------------------------------------------------- Encounter Discharge Information Details Patient Name: Date of Service: Raymond [name], Raymond [name] [name] 9:45 A [name] Medical Record Number: [id number] Patient Account Number: [id number] Date of Birth/Sex: Treating RN: December 10, 1960 (60 y.o. [name] Primary Care Grayton Lobo: [name] Other Clinician: Referring Felise Georgia: Treating Audrey Thull/Extender: Lynden Ang in Treatment: 4 Encounter Discharge Information Items Discharge Condition: Stable Ambulatory Status: Ambulatory Discharge Destination: Home Transportation: Private Auto Accompanied By: alone Schedule Follow-up Appointment: Yes Clinical Summary of Care: Patient Declined Electronic Signature(s) Signed: 04/23/2021 5:45:41 PM By: Levan Hurst RN, BSN Entered By: Levan Hurst on [name] 17:22:09 -------------------------------------------------------------------------------- Lower Extremity Assessment Details Patient Name: Date of Service: Raymond [name], Raymond [name] [name] 9:45 A [name] Medical Record Number: [id number] Patient Account Number: [id number] Date of Birth/Sex: Treating RN: [birth year] (60 y.o. [name]) Primary Care Yachet Mattson: [name] Other Clinician: Referring Demarea Lorey: Treating Mckinze Poirier/Extender: Lynden Ang in Treatment: 4 Edema Assessment Assessed: Shirlyn Goltz: No] [Right: No] Edema: [Left: Ye] [Right: s] Calf Left: Right: Point of Measurement: 35 cm From Medial Instep 38.5 cm Ankle Left: Right: Point of Measurement: 12 cm From Medial Instep 23 cm Electronic Signature(s) Signed: 04/23/2021 2:31:13 PM By: Valeria Batman EMT Entered By: Valeria Batman on [name] 10:42:08 -------------------------------------------------------------------------------- Multi Wound Chart Details Patient Name: Date of Service: Raymond [name], Raymond [name] [name] 9:45 A [name] Medical Record Number: [id number] Patient Account Number: [id number] Date of Birth/Sex: Treating RN: [birth year] (60 y.o. [name]) Primary Care Laquanta Hummel: [name] Other Clinician: Referring Zniyah Midkiff: Treating Orien Mayhall/Extender: Lynden Ang in Treatment: 4 Vital Signs Height(in): 22 Capillary Blood Glucose(mg/dl): 160 Weight(lbs): 224 Pulse(bpm): 44 Body Mass Index(BMI): 34 Blood Pressure(mmHg): 144/87 Temperature(F): 98.1 Respiratory  Rate(breaths/min): 17 Photos: [N/A:N/A] Right Metatarsal head first N/A N/A Wound Location: Blister N/A N/A Wounding Event: Diabetic Wound/Ulcer of the Lower N/A N/A Primary Etiology: Extremity Cataracts, Hypertension, Peripheral N/A N/A Comorbid History: Arterial Disease, Type II Diabetes, Osteomyelitis, Neuropathy 05/27/2019 N/A N/A Date Acquired: 4 N/A N/A Weeks of Treatment: Open N/A N/A Wound Status: 1.7x1.4x0.5 N/A N/A Measurements L x W x D (cm) 1.869 N/A N/A A (cm) : rea 0.935  N/A N/A Volume (cm) : 48.30% N/A N/A % Reduction in A rea: 56.90% N/A N/A % Reduction in Volume: Grade 2 N/A N/A Classification: Medium N/A N/A Exudate A mount: Serosanguineous N/A N/A Exudate Type: red, brown N/A N/A Exudate Color: Well defined, not attached N/A N/A Wound Margin: Large (67-100%) N/A N/A Granulation Amount: Red, Pink N/A N/A Granulation Quality: None Present (0%) N/A N/A Necrotic Amount: Fat Layer (Subcutaneous Tissue): Yes N/A N/A Exposed Structures: Fascia: No Tendon: No Muscle: No Joint: No Bone: No Small (1-33%) N/A N/A Epithelialization: Treatment Notes Wound #2 (Metatarsal head first) Wound Laterality: Right Cleanser Soap and Water Discharge Instruction: May shower and wash wound with dial antibacterial soap and water prior to dressing change. Wound Cleanser Discharge Instruction: Cleanse the wound with wound cleanser prior to applying a clean dressing using gauze sponges, not tissue or cotton balls. Peri-Wound Care Sween Lotion (Moisturizing lotion) Discharge Instruction: Apply moisturizing lotion to foot with dressing changes Topical Primary Dressing KerraCel Ag Gelling Fiber Dressing, 2x2 in (silver alginate) Discharge Instruction: Apply silver alginate to wound bed as instructed Secondary Dressing Woven Gauze Sponge, Non-Sterile 4x4 in Discharge Instruction: Apply over primary dressing as directed. Optifoam Non-Adhesive Dressing, 4x4  in Discharge Instruction: Apply over primary dressing cut to make foam donut Secured With Conforming Stretch Gauze Bandage, Sterile 2x75 (in/in) Discharge Instruction: Secure with stretch gauze as directed. Paper Tape, 2x10 (in/yd) Discharge Instruction: Secure dressing with tape as directed. Compression Wrap Compression Stockings Add-Ons Electronic Signature(s) Signed: 04/23/2021 5:45:41 PM By: Levan Hurst RN, BSN Previous Signature: [name] 5:06:47 PM Version By: Linton Ham MD Entered By: Levan Hurst on [name] 17:35:55 -------------------------------------------------------------------------------- Raymond [name] Details Patient Name: Date of Service: Raymond [name], Raymond [name] [name] 9:45 A [name] Medical Record Number: [id number] Patient Account Number: [id number] Date of Birth/Sex: Treating RN: 04-May-1961 (60 y.o. [name] Primary Care Kissa Campoy: [name] Other Clinician: Referring Caron Tardif: Treating Bennett Ram/Extender: Lynden Ang in Treatment: [place] reviewed with physician Active Inactive Nutrition Nursing Diagnoses: Impaired glucose control: actual or potential Potential for alteratiion in Nutrition/Potential for imbalanced nutrition Goals: Patient/caregiver verbalizes understanding of need to maintain therapeutic glucose control per primary care physician Date Initiated: 03/26/2021 Date Inactivated: [name] Target Resolution Date: [name] Goal Status: Met Patient/caregiver will maintain therapeutic glucose control Date Initiated: 03/26/2021 Target Resolution Date: 05/24/2021 Goal Status: Active Interventions: Assess HgA1c results as ordered upon admission and as needed Provide education on elevated blood sugars and impact on wound healing Treatment Activities: Dietary management education, guidance and counseling : 03/26/2021 Special diet education :  03/26/2021 Notes: Wound/Skin Impairment Nursing Diagnoses: Impaired tissue integrity Knowledge deficit related to ulceration/compromised skin integrity Goals: Patient/caregiver will verbalize understanding of skin care regimen Date Initiated: 03/26/2021 Target Resolution Date: 05/24/2021 Goal Status: Active Ulcer/skin breakdown will have a volume reduction of 30% by week 4 Date Initiated: 03/26/2021 Date Inactivated: [name] Target Resolution Date: [name] Goal Status: Met Interventions: Assess patient/caregiver ability to obtain necessary supplies Assess patient/caregiver ability to perform ulcer/skin care regimen upon admission and as needed Assess ulceration(s) every visit Provide education on ulcer and skin care Treatment Activities: Skin care regimen initiated : 03/26/2021 Topical wound management initiated : 03/26/2021 Notes: Electronic Signature(s) Signed: 04/23/2021 5:45:41 PM By: Levan Hurst RN, BSN Entered By: Levan Hurst on [name] 17:19:39 -------------------------------------------------------------------------------- Pain Assessment Details Patient Name: Date of Service: Raymond [name], Raymond [name] [name] 9:45 A [name] Medical Record Number: [id number] Patient Account Number: [id number] Date of Birth/Sex: Treating RN:  Jun 12, 1960 (60 y.o. [name], [name] Primary Care Katherinne Mofield: [name] Other Clinician: Referring Osker Ayoub: Treating Raziyah Vanvleck/Extender: Lynden Ang in Treatment: 4 Active Problems Location of Pain Severity and Description of Pain Patient Has Paino No Site Locations Pain Management and Medication Current Pain Management: Electronic Signature(s) Signed: 04/23/2021 2:31:13 PM By: Valeria Batman EMT Signed: 04/23/2021 5:11:56 PM By: Rhae Hammock RN Entered By: Valeria Batman on [name] 10:41:35 -------------------------------------------------------------------------------- Patient/Caregiver Education  Details Patient Name: Date of Service: Raymond [name], Raymond [name] 11/29/2022andnbsp9:45 Ragan Record Number: [id number] Patient Account Number: [id number] Date of Birth/Gender: Treating RN: [birth year] (60 y.o. [name] Primary Care Physician: [name] Other Clinician: Referring Physician: Treating Physician/Extender: Lynden Ang in Treatment: 4 Education Assessment Education Provided To: Patient Education Topics Provided Wound/Skin Impairment: Methods: Explain/Verbal Responses: State content correctly Electronic Signature(s) Signed: 04/23/2021 5:45:41 PM By: Levan Hurst RN, BSN Entered By: Levan Hurst on [name] 17:20:15 -------------------------------------------------------------------------------- Wound Assessment Details Patient Name: Date of Service: Raymond [name], Raymond [name] [name] 9:45 A [name] Medical Record Number: [id number] Patient Account Number: [id number] Date of Birth/Sex: Treating RN: June 12, 1960 (60 y.o. [name] Primary Care Metztli Sachdev: Other Clinician: [name] Referring Avontae Burkhead: Treating Marcea Rojek/Extender: Lynden Ang in Treatment: 4 Wound Status Wound Number: 2 Primary Diabetic Wound/Ulcer of the Lower Extremity Etiology: Wound Location: Right Metatarsal head first Wound Open Wounding Event: Blister Status: Date Acquired: 05/27/2019 Comorbid Cataracts, Hypertension, Peripheral Arterial Disease, Type II Weeks Of Treatment: 4 History: Diabetes, Osteomyelitis, Neuropathy Clustered Wound: No Photos Wound Measurements Length: (cm) 1.7 Width: (cm) 1.4 Depth: (cm) 0.5 Area: (cm) 1.869 Volume: (cm) 0.935 % Reduction in Area: 48.3% % Reduction in Volume: 56.9% Epithelialization: Small (1-33%) Tunneling: No Undermining: No Wound Description Classification: Grade 2 Wound Margin: Well defined, not attached Exudate Amount: Medium Exudate Type: Serosanguineous Exudate  Color: red, brown Foul Odor After Cleansing: No Slough/Fibrino No Wound Bed Granulation Amount: Large (67-100%) Exposed Structure Granulation Quality: Red, Pink Fascia Exposed: No Necrotic Amount: None Present (0%) Fat Layer (Subcutaneous Tissue) Exposed: Yes Tendon Exposed: No Muscle Exposed: No Joint Exposed: No Bone Exposed: No Treatment Notes Wound #2 (Metatarsal head first) Wound Laterality: Right Cleanser Soap and Water Discharge Instruction: May shower and wash wound with dial antibacterial soap and water prior to dressing change. Wound Cleanser Discharge Instruction: Cleanse the wound with wound cleanser prior to applying a clean dressing using gauze sponges, not tissue or cotton balls. Peri-Wound Care Sween Lotion (Moisturizing lotion) Discharge Instruction: Apply moisturizing lotion to foot with dressing changes Topical Primary Dressing KerraCel Ag Gelling Fiber Dressing, 2x2 in (silver alginate) Discharge Instruction: Apply silver alginate to wound bed as instructed Secondary Dressing Woven Gauze Sponge, Non-Sterile 4x4 in Discharge Instruction: Apply over primary dressing as directed. Optifoam Non-Adhesive Dressing, 4x4 in Discharge Instruction: Apply over primary dressing cut to make foam donut Secured With Conforming Stretch Gauze Bandage, Sterile 2x75 (in/in) Discharge Instruction: Secure with stretch gauze as directed. Paper Tape, 2x10 (in/yd) Discharge Instruction: Secure dressing with tape as directed. Compression Wrap Compression Stockings Add-Ons Electronic Signature(s) Signed: 04/23/2021 5:45:41 PM By: Levan Hurst RN, BSN Previous Signature: [name] 10:44:20 AM Version By: Valeria Batman EMT Entered By: Levan Hurst on [name] 17:35:34 -------------------------------------------------------------------------------- [place] Details Patient Name: Date of Service: Raymond [name], Raymond [name] [name] 9:45 A [name] Medical Record Number:  [id number] Patient Account Number: [id number] Date of Birth/Sex: Treating RN: 1960/06/06 (60 y.o. [name] Primary Care Katherine Tout: [name] Other Clinician:  Referring Aston Lieske: Treating Yosgart Pavey/Extender: Lynden Ang in Treatment: 4 Vital Signs Time Taken: 10:26 Temperature (F): 98.1 Height (in): 68 Pulse (bpm): 71 Weight (lbs): 224 Respiratory Rate (breaths/min): 17 Body Mass Index (BMI): 34.1 Blood Pressure (mmHg): 144/87 Capillary Blood Glucose (mg/dl): 160 Reference Range: 80 - 120 mg / dl Electronic Signature(s) Signed: 04/23/2021 2:31:13 PM By: Valeria Batman EMT Entered By: Valeria Batman on [name] 10:41:17

## 2021-04-24 NOTE — Progress Notes (Signed)
AVISHAI, REIHL ([id number]) Visit Report for 04/23/2021 HPI Details Patient Name: Date of Service: [name] RO LD 04/23/2021 9:45 A M Medical Record Number: [id number] Patient Account Number: [id number] Date of Birth/Sex: Treating RN: [birth year] (60 y.o. M) Primary Care Provider: [name] Other Clinician: Referring Provider: Treating Provider/Extender: Lynden Ang in Treatment: 4 History of Present Illness HPI Description: Admission 03/26/2021 Mr. Spence Soberano is a 60 year old male with a past medical history of uncontrolled type 2 diabetes on insulin with latest hemoglobin A1c of 15, previous left foot wound with osteomyelitis, peripheral arterial disease, status post kidney transplant, that presents to the clinic for a 1.5-year history of nonhealing wound to the plantar aspect of his right foot. He has been following with Dr. Earleen Newport, podiatry for this issue. He had surgical debridement with bone biopsy on 03/06/2021. Biopsy did not show evidence for osteomyelitis. At that time ACell was placed. He also follows with Dr. Megan Salon for antibiotic therapy and is currently on linezolid and Augmentin. He currently denies systemic signs of infection. He has been using Vaseline to the wound bed. He currently uses a front offloading shoe. He is following Dr. Loanne Drilling for his diabetes. 11/29; patient has not been here in 4 weeks. 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 He was given silver alginate and a surgical shoe and the wound is actually come in by 1 cm in width which is quite a big improvement. He has been evaluated by Dr. Gwenlyn Found because of a history of PAD. Last seen on 04/03/2021. Repeat noninvasive studies were done on 04/12/2021 showing a noncompressible ABI on the right but a TBI of 0.95 but monophasic waveforms. On the left also noncompressible with monophasic waveforms but with a TBI of 0.68. The normal TBI's bilaterally suggest  adequate blood flow. Electronic Signature(s) Signed: 04/23/2021 5:06:47 PM By: Linton Ham MD Entered By: Linton Ham on 04/23/2021 11:25:46 -------------------------------------------------------------------------------- Physical Exam Details Patient Name: Date of Service: [name], [name] 04/23/2021 9:45 A M Medical Record Number: [id number] Patient Account Number: [id number] Date of Birth/Sex: Treating RN: [birth year] (60 y.o. M) Primary Care Provider: [name] Other Clinician: Referring Provider: Treating Provider/Extender: Lynden Ang in Treatment: 4 Constitutional Patient is hypertensive.. Pulse regular and within target range for patient.[name] Respirations regular, non-labored and within target range.. Temperature is normal and within the target range for the patient.. Cardiovascular Both dorsalis pedis and posterior tibial pulses are probably reduced but palpable. His foot is warm including his toes. Notes Wound exam; circular wound on the right first metatarsal head. This apparently has come down quite a bit in size from the last time he was here. Particularly 1 cm in width. We looked at the wound surface with MolecuLight there was no appreciable fluorescence. No evidence of infection around the wound. The circumference of the wound has some thick skin and subcutaneous tissue but no real callus. No evidence of infection Electronic Signature(s) Signed: 04/23/2021 5:06:47 PM By: Linton Ham MD Entered By: Linton Ham on 04/23/2021 11:27:11 -------------------------------------------------------------------------------- Physician Orders Details Patient Name: Date of Service: [name], [name] 04/23/2021 9:45 A M Medical Record Number: [id number] Patient Account Number: [id number] Date of Birth/Sex: Treating RN: 1961-02-03 (60 y.o. [name] Primary Care Provider: [name] Other Clinician: Referring Provider: Treating  Provider/Extender: Lynden Ang in Treatment: 4 Verbal / Phone Orders: No Diagnosis Coding ICD-10 Coding Code Description K35.465K Unspecified open wound, right foot,  initial encounter E11.621 Type 2 diabetes mellitus with foot ulcer E11.40 Type 2 diabetes mellitus with diabetic neuropathy, unspecified Follow-up Appointments ppointment in 1 week. - with Dr. Dellia Nims Return A Bathing/ Shower/ Hygiene May shower and wash wound with soap and water. - with dressing changes Off-Loading Wound #2 Right Metatarsal head first Wedge shoe to: - right foot daily Wound Treatment Wound #2 - Metatarsal head first Wound Laterality: Right Cleanser: Soap and Water 1 x Per Day/30 Days Discharge Instructions: May shower and wash wound with dial antibacterial soap and water prior to dressing change. Cleanser: Wound Cleanser 1 x Per Day/30 Days Discharge Instructions: Cleanse the wound with wound cleanser prior to applying a clean dressing using gauze sponges, not tissue or cotton balls. Peri-Wound Care: Sween Lotion (Moisturizing lotion) 1 x Per Day/30 Days Discharge Instructions: Apply moisturizing lotion to foot with dressing changes Prim Dressing: KerraCel Ag Gelling Fiber Dressing, 2x2 in (silver alginate) (DME) (Generic) 1 x Per Day/30 Days ary Discharge Instructions: Apply silver alginate to wound bed as instructed Secondary Dressing: Woven Gauze Sponge, Non-Sterile 4x4 in (DME) (Generic) 1 x Per Day/30 Days Discharge Instructions: Apply over primary dressing as directed. Secondary Dressing: Optifoam Non-Adhesive Dressing, 4x4 in (DME) (Generic) 1 x Per Day/30 Days Discharge Instructions: Apply over primary dressing cut to make foam donut Secured With: Conforming Stretch Gauze Bandage, Sterile 2x75 (in/in) (DME) (Generic) 1 x Per Day/30 Days Discharge Instructions: Secure with stretch gauze as directed. Secured With: Paper Tape, 2x10 (in/yd) (DME) (Generic) 1 x Per Day/30  Days Discharge Instructions: Secure dressing with tape as directed. Electronic Signature(s) Signed: 04/23/2021 5:06:47 PM By: Linton Ham MD Signed: 04/23/2021 5:45:41 PM By: Levan Hurst RN, BSN Entered By: Levan Hurst on 04/23/2021 15:57:30 -------------------------------------------------------------------------------- Problem List Details Patient Name: Date of Service: [name], [name] 04/23/2021 9:45 A M Medical Record Number: [id number] Patient Account Number: [id number] Date of Birth/Sex: Treating RN: 1960/07/02 (60 y.o. [name] Primary Care Provider: [name] Other Clinician: Referring Provider: Treating Provider/Extender: Lynden Ang in Treatment: 4 Active Problems ICD-10 Encounter Code Description Active Date MDM Diagnosis E11.621 Type 2 diabetes mellitus with foot ulcer 03/26/2021 No Yes L97.518 Non-pressure chronic ulcer of other part of right foot with other specified 04/23/2021 No Yes severity E11.40 Type 2 diabetes mellitus with diabetic neuropathy, unspecified 03/26/2021 No Yes Inactive Problems Resolved Problems Electronic Signature(s) Signed: 04/23/2021 5:06:47 PM By: Linton Ham MD Entered By: Linton Ham on 04/23/2021 11:23:27 -------------------------------------------------------------------------------- Progress Note Details Patient Name: Date of Service: [name], [name] 04/23/2021 9:45 A M Medical Record Number: [id number] Patient Account Number: [id number] Date of Birth/Sex: Treating RN: [birth year] (60 y.o. M) Primary Care Provider: [name] Other Clinician: Referring Provider: Treating Provider/Extender: Lynden Ang in Treatment: 4 Subjective History of Present Illness (HPI) Admission 03/26/2021 Mr. Domonic Kimball is a 60 year old male with a past medical history of uncontrolled type 2 diabetes on insulin with latest hemoglobin A1c of 15, previous left foot  wound with osteomyelitis, peripheral arterial disease, status post kidney transplant, that presents to the clinic for a 1.5-year history of nonhealing wound to the plantar aspect of his right foot. He has been following with Dr. Earleen Newport, podiatry for this issue. He had surgical debridement with bone biopsy on 03/06/2021. Biopsy did not show evidence for osteomyelitis. At that time ACell was placed. He also follows with Dr. Megan Salon for antibiotic therapy and is currently on linezolid and Augmentin. He currently denies systemic signs  of infection. He has been using Vaseline to the wound bed. He currently uses a front offloading shoe. He is following Dr. Loanne Drilling for his diabetes. 11/29; patient has not been here in 4 weeks. 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 He was given silver alginate and a surgical shoe and the wound is actually come in by 1 cm in width which is quite a big improvement. He has been evaluated by Dr. Gwenlyn Found because of a history of PAD. Last seen on 04/03/2021. Repeat noninvasive studies were done on 04/12/2021 showing a noncompressible ABI on the right but a TBI of 0.95 but monophasic waveforms. On the left also noncompressible with monophasic waveforms but with a TBI of 0.68. The normal TBI's bilaterally suggest adequate blood flow. Objective Constitutional Patient is hypertensive.. Pulse regular and within target range for patient.[name] Respirations regular, non-labored and within target range.. Temperature is normal and within the target range for the patient.. Vitals Time Taken: 10:26 AM, Height: 68 in, Weight: 224 lbs, BMI: 34.1, Temperature: 98.1 F, Pulse: 71 bpm, Respiratory Rate: 17 breaths/min, Blood Pressure: 144/87 mmHg, Capillary Blood Glucose: 160 mg/dl. Cardiovascular Both dorsalis pedis and posterior tibial pulses are probably reduced but palpable. His foot is warm including his toes. General Notes: Wound exam; circular wound on the right  first metatarsal head. This apparently has come down quite a bit in size from the last time he was here. Particularly 1 cm in width. We looked at the wound surface with MolecuLight there was no appreciable fluorescence. No evidence of infection around the wound. The circumference of the wound has some thick skin and subcutaneous tissue but no real callus. No evidence of infection Integumentary (Hair, Skin) Wound #2 status is Open. Original cause of wound was Blister. The date acquired was: 05/27/2019. The wound has been in treatment 4 weeks. The wound is located on the Right Metatarsal head first. The wound measures 1.7cm length x 1.4cm width x 0.5cm depth; 1.869cm^2 area and 0.935cm^3 volume. There is Fat Layer (Subcutaneous Tissue) exposed. There is no tunneling or undermining noted. There is a medium amount of serosanguineous drainage noted. The wound margin is well defined and not attached to the wound base. There is large (67-100%) red, pink granulation within the wound bed. There is no necrotic tissue within the wound bed. Assessment Active Problems ICD-10 Type 2 diabetes mellitus with foot ulcer Non-pressure chronic ulcer of other part of right foot with other specified severity Type 2 diabetes mellitus with diabetic neuropathy, unspecified Plan Follow-up Appointments: Return Appointment in 1 week. - with Dr. Dellia Nims Bathing/ Shower/ Hygiene: May shower and wash wound with soap and water. - with dressing changes Off-Loading: Wound #2 Right Metatarsal head first: Wedge shoe to: - right foot daily WOUND #2: - Metatarsal head first Wound Laterality: Right Cleanser: Soap and Water 1 x Per Day/30 Days Discharge Instructions: May shower and wash wound with dial antibacterial soap and water prior to dressing change. Cleanser: Wound Cleanser 1 x Per Day/30 Days Discharge Instructions: Cleanse the wound with wound cleanser prior to applying a clean dressing using gauze sponges, not tissue or  cotton balls. Peri-Wound Care: Sween Lotion (Moisturizing lotion) 1 x Per Day/30 Days Discharge Instructions: Apply moisturizing lotion to foot with dressing changes Prim Dressing: KerraCel Ag Gelling Fiber Dressing, 2x2 in (silver alginate) (Dispense As Written) 1 x Per Day/30 Days ary Discharge Instructions: Apply silver alginate to wound bed as instructed Secondary Dressing: Woven Gauze Sponge, Non-Sterile 4x4  in (Generic) 1 x Per Day/30 Days Discharge Instructions: Apply over primary dressing as directed. Secondary Dressing: Optifoam Non-Adhesive Dressing, 4x4 in (Generic) 1 x Per Day/30 Days Discharge Instructions: Apply over primary dressing cut to make foam donut Secured With: Conforming Stretch Gauze Bandage, Sterile 2x75 (in/in) (Generic) 1 x Per Day/30 Days Discharge Instructions: Secure with stretch gauze as directed. Secured With: Paper T ape, 2x10 (in/yd) (Generic) 1 x Per Day/30 Days Discharge Instructions: Secure dressing with tape as directed. 1. I continued with the silver alginate the patient is changing the dressing himself and using his surgical shoe. Somewhat surprisingly the wound has come in quite a bit and if this continues that would be really good news. 2. I did discuss with him a total contact cast and I think it was discussed with him last time. It is a his right foot he would not be able to drive. However I told him we would discuss this if the wound stalls. 3. Because of the possibility of a cast to use MolecuLight to look at the wound surface there was no appreciable fluorescence 4. The patient has some degree of PAD with monophasic waveforms but his peripheral pulses are palpable his foot is warm. My general feeling is there should be enough blood flow to heal this. I believe Dr. Gwenlyn Found is following. He has a history of a renal transplant in 2016 making things even more complicated. Dr. Kennon Holter interpretation of the recent Doppler suggest that he had patent femoral  popliteal pulse pulses but disease in his tibials including an anterior tibial and peroneal 5. The adequacy of the TBI is really suggested there is enough perfusion to to tolerate a total contact cast. MolecuLight DX: 1st Scanned Wound Fluorescence bacterial imaging was medically necessary today due to Initial Evaluation of the wound with MolecuLightDX to determine baseline (Indication): bacterial bioburden level There was no fluorescence suggestive of a high bacterial load on todays There was no fluorescence suggestive of a high bacterial load on todays MolecuLight Results scan. MolecuLight Procedure The MolecularLight DX device was cleaned with a disinfectant wipe prior to use., The correct patient profile was confirmed and correct wound was verified., Range finder sensor used to ensure appropriate distance selected The following was completed: between imaging unit and wound bed, Room lights were turned off and the ambient light sensor was checked., Blue circle appeared around the lightbulb., The fluorescence icon was selected. Screen was tapped to enhance focus and the image was captured. Additional drapes were used to ensure adequate darknesso Yes Electronic Signature(s) Signed: 04/23/2021 5:06:47 PM By: Linton Ham MD Entered By: Linton Ham on 04/23/2021 11:30:44 -------------------------------------------------------------------------------- SuperBill Details Patient Name: Date of Service: [name], [name] 04/23/2021 Medical Record Number: [id number] Patient Account Number: [id number] Date of Birth/Sex: Treating RN: 11-Oct-1960 (60 y.o. M) Primary Care Provider: [name] Other Clinician: Referring Provider: Treating Provider/Extender: Lynden Ang in Treatment: 4 Diagnosis Coding ICD-10 Codes Code Description [phone] Type 2 diabetes mellitus with foot ulcer L97.518 Non-pressure chronic ulcer of other part of right foot with other  specified severity E11.40 Type 2 diabetes mellitus with diabetic neuropathy, unspecified Facility Procedures CPT4 Code: [id number] Description: 89381 - WOUND CARE VISIT-LEV 3 EST PT Modifier: Quantity: 1 CPT4 Code: [id number] Description: 8527P NONCNTACT RT FLORO WND 1ST STE ICD-10 Diagnosis Description L97.518 Non-pressure chronic ulcer of other part of right foot with other specified sever Modifier: ity Quantity: 1 Physician Procedures : CPT4 Code Description Modifier [id number] - WC  PHYS LEVEL 3 - EST PT 25 ICD-10 Diagnosis Description E11.621 Type 2 diabetes mellitus with foot ulcer L97.518 Non-pressure chronic ulcer of other part of right foot with other specified severity E11.40  Type 2 diabetes mellitus with diabetic neuropathy, unspecified Quantity: 1 : [id number] NONCNTACT RT FLORO WND 1ST STE ICD-10 Diagnosis Description L97.518 Non-pressure chronic ulcer of other part of right foot with other specified severity Quantity: 1 Electronic Signature(s) Signed: 04/23/2021 5:45:41 PM By: Levan Hurst RN, BSN Signed: 04/24/2021 4:53:46 PM By: Linton Ham MD Previous Signature: 04/23/2021 5:06:47 PM Version By: Linton Ham MD Entered By: Levan Hurst on 04/23/2021 17:20:58

## 2021-04-25 ENCOUNTER — Ambulatory Visit: Payer: BC Managed Care – PPO | Admitting: Endocrinology

## 2021-04-30 ENCOUNTER — Encounter (HOSPITAL_BASED_OUTPATIENT_CLINIC_OR_DEPARTMENT_OTHER): Payer: BC Managed Care – PPO | Admitting: Internal Medicine

## 2021-04-30 ENCOUNTER — Encounter (HOSPITAL_BASED_OUTPATIENT_CLINIC_OR_DEPARTMENT_OTHER): Payer: BC Managed Care – PPO | Attending: Internal Medicine | Admitting: Internal Medicine

## 2021-04-30 ENCOUNTER — Other Ambulatory Visit: Payer: Self-pay

## 2021-04-30 DIAGNOSIS — M869 Osteomyelitis, unspecified: Secondary | ICD-10-CM | POA: Diagnosis not present

## 2021-04-30 DIAGNOSIS — Z940 Kidney transplant status: Secondary | ICD-10-CM | POA: Insufficient documentation

## 2021-04-30 DIAGNOSIS — E1140 Type 2 diabetes mellitus with diabetic neuropathy, unspecified: Secondary | ICD-10-CM | POA: Diagnosis not present

## 2021-04-30 DIAGNOSIS — E11621 Type 2 diabetes mellitus with foot ulcer: Secondary | ICD-10-CM | POA: Insufficient documentation

## 2021-04-30 DIAGNOSIS — E1169 Type 2 diabetes mellitus with other specified complication: Secondary | ICD-10-CM | POA: Diagnosis not present

## 2021-04-30 DIAGNOSIS — L97518 Non-pressure chronic ulcer of other part of right foot with other specified severity: Secondary | ICD-10-CM | POA: Insufficient documentation

## 2021-04-30 DIAGNOSIS — E1151 Type 2 diabetes mellitus with diabetic peripheral angiopathy without gangrene: Secondary | ICD-10-CM | POA: Insufficient documentation

## 2021-05-01 NOTE — Progress Notes (Signed)
Raymond [name], TAY ([id number]) Visit Report for 04/30/2021 Arrival Information Details Patient Name: Date of Service: Raymond [name] 04/30/2021 8:00 A M Medical Record Number: [id number] Patient Account Number: [id number] Date of Birth/Sex: Treating RN: [birth year] (60 y.o. M) Primary Care Presten Joost: [name] Other Clinician: Referring Martine Trageser: Treating Allisha Harter/Extender: Lynden Ang in Treatment: 5 Visit Information History Since Last Visit Added or deleted any medications: No Patient Arrived: Ambulatory Any new allergies or adverse reactions: No Arrival Time: 07:48 Had a fall or experienced change in No Accompanied By: self activities of daily living that may affect Transfer Assistance: None risk of falls: Patient Identification Verified: Yes Signs or symptoms of abuse/neglect since last visito No Secondary Verification Process Completed: Yes Hospitalized since last visit: No Patient Requires Transmission-Based Precautions: No Implantable device outside of the clinic excluding No Patient Has Alerts: No cellular tissue based products placed in the center since last visit: Has Dressing in Place as Prescribed: Yes Pain Present Now: No Electronic Signature(s) Signed: 04/30/2021 8:34:42 AM By: Sandre Kitty Entered By: Sandre Kitty on 04/30/2021 07:48:42 -------------------------------------------------------------------------------- Encounter Discharge Information Details Patient Name: Date of Service: Raymond [name] 04/30/2021 8:00 A M Medical Record Number: [id number] Patient Account Number: [id number] Date of Birth/Sex: Treating RN: October 30, 1960 (60 y.o. [name], [name] Primary Care Eloyse Causey: [name] Other Clinician: Referring Alliyah Roesler: Treating Mariea Mcmartin/Extender: Lynden Ang in Treatment: 5 Encounter Discharge Information Items Post Procedure Vitals Discharge Condition: Stable Temperature (F):  97.4 Ambulatory Status: Ambulatory Pulse (bpm): 74 Discharge Destination: Home Respiratory Rate (breaths/min): 17 Transportation: Private Auto Blood Pressure (mmHg): 147/74 Accompanied By: self Schedule Follow-up Appointment: Yes Clinical Summary of Care: Patient Declined Electronic Signature(s) Signed: 05/01/2021 4:34:42 PM By: Rhae Hammock RN Entered By: Rhae Hammock on 04/30/2021 08:33:46 -------------------------------------------------------------------------------- Lower Extremity Assessment Details Patient Name: Date of Service: Raymond [name] 04/30/2021 8:00 A M Medical Record Number: [id number] Patient Account Number: [id number] Date of Birth/Sex: Treating RN: 30-Dec-1960 (60 y.o. [name], [name] Primary Care [name]: [name] Other Clinician: Referring Jona Zappone: Treating Zahir Eisenhour/Extender: Lynden Ang in Treatment: 5 Edema Assessment Assessed: Shirlyn Goltz: No] Patrice Paradise: Yes] Edema: [Left: Ye] [Right: s] Calf Left: Right: Point of Measurement: 35 cm From Medial Instep 38.5 cm Ankle Left: Right: Point of Measurement: 12 cm From Medial Instep 23 cm Vascular Assessment Pulses: Dorsalis Pedis Palpable: [Right:Yes] Posterior Tibial Palpable: [Right:Yes] Electronic Signature(s) Signed: 05/01/2021 4:34:42 PM By: Rhae Hammock RN Entered By: Rhae Hammock on 04/30/2021 07:52:25 -------------------------------------------------------------------------------- Multi Wound Chart Details Patient Name: Date of Service: Raymond [name] 04/30/2021 8:00 A M Medical Record Number: [id number] Patient Account Number: [id number] Date of Birth/Sex: Treating RN: [birth year] (60 y.o. M) Primary Care Dois Juarbe: [name] Other Clinician: Referring Daryon Remmert: Treating Russell Engelstad/Extender: Lynden Ang in Treatment: 5 Vital Signs Height(in): 63 Pulse(bpm): 31 Weight(lbs): 924 Blood Pressure(mmHg):  174/89 Body Mass Index(BMI): 34 Temperature(F): 98.4 Respiratory Rate(breaths/min): 17 Photos: [2:Right Metatarsal head first] [N/A:N/A N/A] Wound Location: [2:Blister] [N/A:N/A] Wounding Event: [2:Diabetic Wound/Ulcer of the Lower] [N/A:N/A] Primary Etiology: [2:Extremity Cataracts, Hypertension, Peripheral N/A] Comorbid History: [2:Arterial Disease, Type II Diabetes, Osteomyelitis, Neuropathy 05/27/2019] [N/A:N/A] Date Acquired: [2:5] [N/A:N/A] Weeks of Treatment: [2:Open] [N/A:N/A] Wound Status: [2:1.6x1.2x0.4] [N/A:N/A] Measurements L x W x D (cm) [2:1.508] [N/A:N/A] A (cm) : rea [2:0.603] [N/A:N/A] Volume (cm) : [2:58.30%] [N/A:N/A] % Reduction in A [2:rea: 72.20%] [N/A:N/A] % Reduction in Volume: [2:Grade 2] [N/A:N/A] Classification: [2:Medium] [N/A:N/A] Exudate A mount: [2:Serosanguineous] [N/A:N/A] Exudate  Type: [2:red, brown] [N/A:N/A] Exudate Color: [2:Well defined, not attached] [N/A:N/A] Wound Margin: [2:Large (67-100%)] [N/A:N/A] Granulation A mount: [2:Red, Pink] [N/A:N/A] Granulation Quality: [2:None Present (0%)] [N/A:N/A] Necrotic A mount: [2:Fat Layer (Subcutaneous Tissue): Yes N/A] Exposed Structures: [2:Fascia: No Tendon: No Muscle: No Joint: No Bone: No Small (1-33%)] [N/A:N/A] Epithelialization: [2:Debridement - Excisional] [N/A:N/A] Debridement: Pre-procedure Verification/Time Out 08:25 [N/A:N/A] Taken: [2:Lidocaine] [N/A:N/A] Pain Control: [2:Subcutaneous] [N/A:N/A] Tissue Debrided: [2:Skin/Subcutaneous Tissue] [N/A:N/A] Level: [2:1.92] [N/A:N/A] Debridement A (sq cm): [2:rea Curette] [N/A:N/A] Instrument: [2:Minimum] [N/A:N/A] Bleeding: [2:Pressure] [N/A:N/A] Hemostasis A chieved: [2:0] [N/A:N/A] Procedural Pain: [2:0] [N/A:N/A] Post Procedural Pain: [2:Procedure was tolerated well] [N/A:N/A] Debridement Treatment Response: [2:1.6x1.2x0.4] [N/A:N/A] Post Debridement Measurements L x W x D (cm) [2:0.603] [N/A:N/A] Post Debridement Volume: (cm)  [2:Debridement] [N/A:N/A] Procedures Performed: [2:T Contact Cast otal] Treatment Notes Wound #2 (Metatarsal head first) Wound Laterality: Right Cleanser Soap and Water Discharge Instruction: May shower and wash wound with dial antibacterial soap and water prior to dressing change. Wound Cleanser Discharge Instruction: Cleanse the wound with wound cleanser prior to applying a clean dressing using gauze sponges, not tissue or cotton balls. Peri-Wound Care Sween Lotion (Moisturizing lotion) Discharge Instruction: Apply moisturizing lotion to foot with dressing changes Topical Primary Dressing KerraCel Ag Gelling Fiber Dressing, 2x2 in (silver alginate) Discharge Instruction: Apply silver alginate to wound bed as instructed Secondary Dressing Woven Gauze Sponge, Non-Sterile 4x4 in Discharge Instruction: Apply over primary dressing as directed. Optifoam Non-Adhesive Dressing, 4x4 in Discharge Instruction: Apply over primary dressing cut to make foam donut Secured With Conforming Stretch Gauze Bandage, Sterile 2x75 (in/in) Discharge Instruction: Secure with stretch gauze as directed. Paper Tape, 2x10 (in/yd) Discharge Instruction: Secure dressing with tape as directed. Compression Wrap Compression Stockings Add-Ons Electronic Signature(s) Signed: 04/30/2021 4:21:48 PM By: Linton Ham MD Entered By: Linton Ham on 04/30/2021 09:04:59 -------------------------------------------------------------------------------- Multi-Disciplinary Care Plan Details Patient Name: Date of Service: [name], HA [name] 04/30/2021 8:00 A M Medical Record Number: [id number] Patient Account Number: [id number] Date of Birth/Sex: Treating RN: 24-Feb-1961 (60 y.o. [name], [name] Primary Care Dervin Vore: [name] Other Clinician: Referring Lakita Sahlin: Treating Oseph Imburgia/Extender: Lynden Ang in Treatment: [place] reviewed with physician Active  Inactive Nutrition Nursing Diagnoses: Impaired glucose control: actual or potential Potential for alteratiion in Nutrition/Potential for imbalanced nutrition Goals: Patient/caregiver verbalizes understanding of need to maintain therapeutic glucose control per primary care physician Date Initiated: 03/26/2021 Date Inactivated: 04/23/2021 Target Resolution Date: 04/23/2021 Goal Status: Met Patient/caregiver will maintain therapeutic glucose control Date Initiated: 03/26/2021 Target Resolution Date: 05/24/2021 Goal Status: Active Interventions: Assess HgA1c results as ordered upon admission and as needed Provide education on elevated blood sugars and impact on wound healing Treatment Activities: Dietary management education, guidance and counseling : 03/26/2021 Special diet education : 03/26/2021 Notes: Wound/Skin Impairment Nursing Diagnoses: Impaired tissue integrity Knowledge deficit related to ulceration/compromised skin integrity Goals: Patient/caregiver will verbalize understanding of skin care regimen Date Initiated: 03/26/2021 Target Resolution Date: 05/24/2021 Goal Status: Active Ulcer/skin breakdown will have a volume reduction of 30% by week 4 Date Initiated: 03/26/2021 Date Inactivated: 04/23/2021 Target Resolution Date: 04/23/2021 Goal Status: Met Interventions: Assess patient/caregiver ability to obtain necessary supplies Assess patient/caregiver ability to perform ulcer/skin care regimen upon admission and as needed Assess ulceration(s) every visit Provide education on ulcer and skin care Treatment Activities: Skin care regimen initiated : 03/26/2021 Topical wound management initiated : 03/26/2021 Notes: Electronic Signature(s) Signed: 05/01/2021 4:34:42 PM By: Rhae Hammock RN Entered By: Rhae Hammock on 04/30/2021 08:31:45 --------------------------------------------------------------------------------  Pain Assessment Details Patient Name: Date of  Service: [name] [name] 04/30/2021 8:00 A M Medical Record Number: [id number] Patient Account Number: [id number] Date of Birth/Sex: Treating RN: 02-15-1961 (60 y.o. M) Primary Care Meagon Duskin: [name] Other Clinician: Referring Anaka Beazer: Treating Roye Gustafson/Extender: Lynden Ang in Treatment: 5 Active Problems Location of Pain Severity and Description of Pain Patient Has Paino No Site Locations Pain Management and Medication Current Pain Management: Electronic Signature(s) Signed: 04/30/2021 8:34:42 AM By: Sandre Kitty Entered By: Sandre Kitty on 04/30/2021 07:49:10 -------------------------------------------------------------------------------- Patient/Caregiver Education Details Patient Name: Date of Service: [name], HA [name] 12/6/2022andnbsp8:00 A M Medical Record Number: [id number] Patient Account Number: [id number] Date of Birth/Gender: Treating RN: 1960-09-29 (60 y.o. [name] Primary Care Physician: [name] Other Clinician: Referring Physician: Treating Physician/Extender: Lynden Ang in Treatment: 5 Education Assessment Education Provided To: Patient Education Topics Provided Elevated Blood Sugar/ Impact on Healing: Methods: Explain/Verbal Responses: State content correctly Wound/Skin Impairment: Methods: Explain/Verbal Responses: State content correctly Electronic Signature(s) Signed: 05/01/2021 4:34:42 PM By: Rhae Hammock RN Entered By: Rhae Hammock on 04/30/2021 08:32:10 -------------------------------------------------------------------------------- Wound Assessment Details Patient Name: Date of Service: Raymond [name] 04/30/2021 8:00 A M Medical Record Number: [id number] Patient Account Number: [id number] Date of Birth/Sex: Treating RN: 01/25/1961 (60 y.o. [name], [place] Primary Care Madelin Weseman: [name] Other Clinician: Referring  Dorothea Yow: Treating Deniz Eskridge/Extender: Lynden Ang in Treatment: 5 Wound Status Wound Number: 2 Primary Diabetic Wound/Ulcer of the Lower Extremity Etiology: Wound Location: Right Metatarsal head first Wound Open Wounding Event: Blister Status: Date Acquired: 05/27/2019 Comorbid Cataracts, Hypertension, Peripheral Arterial Disease, Type II Weeks Of Treatment: 5 History: Diabetes, Osteomyelitis, Neuropathy Clustered Wound: No Photos Wound Measurements Length: (cm) 1.6 Width: (cm) 1.2 Depth: (cm) 0.4 Area: (cm) 1.508 Volume: (cm) 0.603 % Reduction in Area: 58.3% % Reduction in Volume: 72.2% Epithelialization: Small (1-33%) Tunneling: No Undermining: No Wound Description Classification: Grade 2 Wound Margin: Well defined, not attached Exudate Amount: Medium Exudate Type: Serosanguineous Exudate Color: red, brown Foul Odor After Cleansing: No Slough/Fibrino No Wound Bed Granulation Amount: Large (67-100%) Exposed Structure Granulation Quality: Red, Pink Fascia Exposed: No Necrotic Amount: None Present (0%) Fat Layer (Subcutaneous Tissue) Exposed: Yes Tendon Exposed: No Muscle Exposed: No Joint Exposed: No Bone Exposed: No Treatment Notes Wound #2 (Metatarsal head first) Wound Laterality: Right Cleanser Soap and Water Discharge Instruction: May shower and wash wound with dial antibacterial soap and water prior to dressing change. Wound Cleanser Discharge Instruction: Cleanse the wound with wound cleanser prior to applying a clean dressing using gauze sponges, not tissue or cotton balls. Peri-Wound Care Sween Lotion (Moisturizing lotion) Discharge Instruction: Apply moisturizing lotion to foot with dressing changes Topical Primary Dressing KerraCel Ag Gelling Fiber Dressing, 2x2 in (silver alginate) Discharge Instruction: Apply silver alginate to wound bed as instructed Secondary Dressing Woven Gauze Sponge, Non-Sterile 4x4 in Discharge  Instruction: Apply over primary dressing as directed. Optifoam Non-Adhesive Dressing, 4x4 in Discharge Instruction: Apply over primary dressing cut to make foam donut Secured With Conforming Stretch Gauze Bandage, Sterile 2x75 (in/in) Discharge Instruction: Secure with stretch gauze as directed. Paper Tape, 2x10 (in/yd) Discharge Instruction: Secure dressing with tape as directed. Compression Wrap Compression Stockings Add-Ons Electronic Signature(s) Signed: 04/30/2021 8:34:42 AM By: Sandre Kitty Signed: 05/01/2021 4:34:42 PM By: Rhae Hammock RN Entered By: Sandre Kitty on 04/30/2021 07:54:11 -------------------------------------------------------------------------------- Vitals Details Patient Name: Date of Service: Raymond [name] 04/30/2021 8:00 A M  Medical Record Number: [id number] Patient Account Number: [id number] Date of Birth/Sex: Treating RN: Oct 14, 1960 (60 y.o. M) Primary Care Kaenan Jake: [name] Other Clinician: Referring Montavious Wierzba: Treating Zakiah Gauthreaux/Extender: Lynden Ang in Treatment: 5 Vital Signs Time Taken: 07:48 Temperature (F): 98.4 Height (in): 68 Pulse (bpm): 70 Weight (lbs): 224 Respiratory Rate (breaths/min): 17 Body Mass Index (BMI): 34.1 Blood Pressure (mmHg): 174/89 Reference Range: 80 - 120 mg / dl Electronic Signature(s) Signed: 04/30/2021 8:34:42 AM By: Sandre Kitty Entered By: Sandre Kitty on 04/30/2021 07:49:05

## 2021-05-01 NOTE — Progress Notes (Signed)
LEWI, DROST ([id number]) Visit Report for 04/30/2021 Debridement Details Patient Name: Date of Service: [facility], [name] RO LD 04/30/2021 8:00 A M Medical Record Number: [id number] Patient Account Number: [id number] Date of Birth/Sex: Treating RN: 23-Mar-1961 (60 y.o. M) Primary Care Provider: [name] Other Clinician: Referring Provider: Treating Provider/Extender: Lynden Ang in Treatment: 5 Debridement Performed for Assessment: Wound #2 Right Metatarsal head first Performed By: Physician Ricard Dillon., MD Debridement Type: Debridement Severity of Tissue Pre Debridement: Fat layer exposed Level of Consciousness (Pre-procedure): Awake and Alert Pre-procedure Verification/Time Out Yes - 08:25 Taken: Start Time: 08:25 Pain Control: Lidocaine T Area Debrided (L x W): otal 1.6 (cm) x 1.2 (cm) = 1.92 (cm) Tissue and other material debrided: Viable, Non-Viable, Subcutaneous, Skin: Dermis , Skin: Epidermis Level: Skin/Subcutaneous Tissue Debridement Description: Excisional Instrument: Curette Bleeding: Minimum Hemostasis Achieved: Pressure End Time: 08:25 Procedural Pain: 0 Post Procedural Pain: 0 Response to Treatment: Procedure was tolerated well Level of Consciousness (Post- Awake and Alert procedure): Post Debridement Measurements of Total Wound Length: (cm) 1.6 Width: (cm) 1.2 Depth: (cm) 0.4 Volume: (cm) 0.603 Character of Wound/Ulcer Post Debridement: Improved Severity of Tissue Post Debridement: Fat layer exposed Post Procedure Diagnosis Same as Pre-procedure Electronic Signature(s) Signed: 04/30/2021 4:21:48 PM By: Linton Ham MD Entered By: Linton Ham on 04/30/2021 09:05:10 -------------------------------------------------------------------------------- HPI Details Patient Name: Date of Service: [name], [name] 04/30/2021 8:00 A M Medical Record Number: [id number] Patient Account Number: [id number] Date of Birth/Sex:  Treating RN: 28-Aug-1960 (60 y.o. M) Primary Care Provider: [name] Other Clinician: Referring Provider: Treating Provider/Extender: Lynden Ang in Treatment: 5 History of Present Illness HPI Description: Admission 03/26/2021 Mr. Savior Himebaugh is a 60 year old male with a past medical history of uncontrolled type 2 diabetes on insulin with latest hemoglobin A1c of 15, previous left foot wound with osteomyelitis, peripheral arterial disease, status post kidney transplant, that presents to the clinic for a 1.5-year history of nonhealing wound to the plantar aspect of his right foot. He has been following with Dr. Earleen Newport, podiatry for this issue. He had surgical debridement with bone biopsy on 03/06/2021. Biopsy did not show evidence for osteomyelitis. At that time ACell was placed. He also follows with Dr. Megan Salon for antibiotic therapy and is currently on linezolid and Augmentin. He currently denies systemic signs of infection. He has been using Vaseline to the wound bed. He currently uses a front offloading shoe. He is following Dr. Loanne Drilling for his diabetes. 11/29; patient has not been here in 4 weeks. 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 He was given silver alginate and a surgical shoe and the wound is actually come in by 1 cm in width which is quite a big improvement. He has been evaluated by Dr. Gwenlyn Found because of a history of PAD. Last seen on 04/03/2021. Repeat noninvasive studies were done on 04/12/2021 showing a noncompressible ABI on the right but a TBI of 0.95 but monophasic waveforms. On the left also noncompressible with monophasic waveforms but with a TBI of 0.68. The normal TBI's bilaterally suggest adequate blood flow 12/6; the wound was dramatically better last week. Slightly smaller this week but unfortunately the tissue was nonadherent. He has been using silver alginate with a surgical shoe Electronic  Signature(s) Signed: 04/30/2021 4:21:48 PM By: Linton Ham MD Entered By: Linton Ham on 04/30/2021 09:06:04 -------------------------------------------------------------------------------- Physical Exam Details Patient Name: Date of Service: [name], [name] 04/30/2021 8:00 A  M Medical Record Number: [id number] Patient Account Number: [id number] Date of Birth/Sex: Treating RN: 1960/09/14 (60 y.o. M) Primary Care Provider: [name] Other Clinician: Referring Provider: Treating Provider/Extender: Lynden Ang in Treatment: 5 Constitutional Patient is hypertensive.. Pulse regular and within target range for patient.[name] Respirations regular, non-labored and within target range.. Temperature is normal and within the target range for the patient.[name] Appears in no distress. Notes Wound exam; circular wound on the right first metatarsal head. Tissue in the middle of the wound was nonadherent. I remove this thick skin and subcutaneous tissue from around the wound margins. There was no evidence of infection. Hemostasis with direct pressure Electronic Signature(s) Signed: 04/30/2021 4:21:48 PM By: Linton Ham MD Entered By: Linton Ham on 04/30/2021 09:06:49 -------------------------------------------------------------------------------- Physician Orders Details Patient Name: Date of Service: [name], [name] 04/30/2021 8:00 A M Medical Record Number: [id number] Patient Account Number: [id number] Date of Birth/Sex: Treating RN: 1960/10/12 (60 y.o. [name] Primary Care Provider: [name] Other Clinician: Referring Provider: Treating Provider/Extender: Lynden Ang in Treatment: 5 Verbal / Phone Orders: No Diagnosis Coding Follow-up Appointments ppointment in 1 week. - with Dr. Dellia Nims ****EXTRA TIME 60 MINUTES**** Return A ppointment in: - on Friday of this week 12/09 for first TCC change ****EXTRA TIME 60  MINUTES**** Return A Bathing/ Shower/ Hygiene May shower and wash wound with soap and water. - with dressing changes Off-Loading Wound #2 Right Metatarsal head first Total Contact Cast to Right Lower Extremity Wedge shoe to: - right foot daily Wound Treatment Wound #2 - Metatarsal head first Wound Laterality: Right Cleanser: Soap and Water 1 x Per Day/30 Days Discharge Instructions: May shower and wash wound with dial antibacterial soap and water prior to dressing change. Cleanser: Wound Cleanser 1 x Per Day/30 Days Discharge Instructions: Cleanse the wound with wound cleanser prior to applying a clean dressing using gauze sponges, not tissue or cotton balls. Peri-Wound Care: Sween Lotion (Moisturizing lotion) 1 x Per Day/30 Days Discharge Instructions: Apply moisturizing lotion to foot with dressing changes Prim Dressing: KerraCel Ag Gelling Fiber Dressing, 2x2 in (silver alginate) (Generic) 1 x Per Day/30 Days ary Discharge Instructions: Apply silver alginate to wound bed as instructed Secondary Dressing: Woven Gauze Sponge, Non-Sterile 4x4 in (Generic) 1 x Per Day/30 Days Discharge Instructions: Apply over primary dressing as directed. Secondary Dressing: Optifoam Non-Adhesive Dressing, 4x4 in (Generic) 1 x Per Day/30 Days Discharge Instructions: Apply over primary dressing cut to make foam donut Secured With: Conforming Stretch Gauze Bandage, Sterile 2x75 (in/in) (Generic) 1 x Per Day/30 Days Discharge Instructions: Secure with stretch gauze as directed. Secured With: Paper Tape, 2x10 (in/yd) (Generic) 1 x Per Day/30 Days Discharge Instructions: Secure dressing with tape as directed. Electronic Signature(s) Signed: 04/30/2021 4:21:48 PM By: Linton Ham MD Signed: 05/01/2021 4:34:42 PM By: Rhae Hammock RN Entered By: Rhae Hammock on 04/30/2021 08:31:38 -------------------------------------------------------------------------------- Problem List Details Patient  Name: Date of Service: [name], [name] 04/30/2021 8:00 A M Medical Record Number: [id number] Patient Account Number: [id number] Date of Birth/Sex: Treating RN: [birth year] (60 y.o. M) Primary Care Provider: [name] Other Clinician: Referring Provider: Treating Provider/Extender: Lynden Ang in Treatment: 5 Active Problems ICD-10 Encounter Code Description Active Date MDM Diagnosis E11.621 Type 2 diabetes mellitus with foot ulcer 03/26/2021 No Yes L97.518 Non-pressure chronic ulcer of other part of right foot with other specified 04/23/2021 No Yes severity E11.40 Type 2 diabetes mellitus with diabetic neuropathy, unspecified 03/26/2021  No Yes Inactive Problems Resolved Problems Electronic Signature(s) Signed: 04/30/2021 4:21:48 PM By: Linton Ham MD Entered By: Linton Ham on 04/30/2021 09:04:52 -------------------------------------------------------------------------------- Progress Note Details Patient Name: Date of Service: [name], [name] 04/30/2021 8:00 A M Medical Record Number: [id number] Patient Account Number: [id number] Date of Birth/Sex: Treating RN: 07-01-1960 (59 y.o. M) Primary Care Provider: [name] Other Clinician: Referring Provider: Treating Provider/Extender: Lynden Ang in Treatment: 5 Subjective History of Present Illness (HPI) Admission 03/26/2021 Mr. Alyas Creary is a 60 year old male with a past medical history of uncontrolled type 2 diabetes on insulin with latest hemoglobin A1c of 15, previous left foot wound with osteomyelitis, peripheral arterial disease, status post kidney transplant, that presents to the clinic for a 1.5-year history of nonhealing wound to the plantar aspect of his right foot. He has been following with Dr. Earleen Newport, podiatry for this issue. He had surgical debridement with bone biopsy on 03/06/2021. Biopsy did not show evidence for osteomyelitis. At that time  ACell was placed. He also follows with Dr. Megan Salon for antibiotic therapy and is currently on linezolid and Augmentin. He currently denies systemic signs of infection. He has been using Vaseline to the wound bed. He currently uses a front offloading shoe. He is following Dr. Loanne Drilling for his diabetes. 11/29; patient has not been here in 4 weeks. 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 He was given silver alginate and a surgical shoe and the wound is actually come in by 1 cm in width which is quite a big improvement. He has been evaluated by Dr. Gwenlyn Found because of a history of PAD. Last seen on 04/03/2021. Repeat noninvasive studies were done on 04/12/2021 showing a noncompressible ABI on the right but a TBI of 0.95 but monophasic waveforms. On the left also noncompressible with monophasic waveforms but with a TBI of 0.68. The normal TBI's bilaterally suggest adequate blood flow 12/6; the wound was dramatically better last week. Slightly smaller this week but unfortunately the tissue was nonadherent. He has been using silver alginate with a surgical shoe Objective Constitutional Patient is hypertensive.. Pulse regular and within target range for patient.[name] Respirations regular, non-labored and within target range.. Temperature is normal and within the target range for the patient.[name] Appears in no distress. Vitals Time Taken: 7:48 AM, Height: 68 in, Weight: 224 lbs, BMI: 34.1, Temperature: 98.4 F, Pulse: 70 bpm, Respiratory Rate: 17 breaths/min, Blood Pressure: 174/89 mmHg. General Notes: Wound exam; circular wound on the right first metatarsal head. Tissue in the middle of the wound was nonadherent. I remove this thick skin and subcutaneous tissue from around the wound margins. There was no evidence of infection. Hemostasis with direct pressure Integumentary (Hair, Skin) Wound #2 status is Open. Original cause of wound was Blister. The date acquired was: 05/27/2019. The wound  has been in treatment 5 weeks. The wound is located on the Right Metatarsal head first. The wound measures 1.6cm length x 1.2cm width x 0.4cm depth; 1.508cm^2 area and 0.603cm^3 volume. There is Fat Layer (Subcutaneous Tissue) exposed. There is no tunneling or undermining noted. There is a medium amount of serosanguineous drainage noted. The wound margin is well defined and not attached to the wound base. There is large (67-100%) red, pink granulation within the wound bed. There is no necrotic tissue within the wound bed. 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 There was a Excisional Skin/Subcutaneous Tissue Debridement with a total area of 1.92 sq cm performed by Ricard Dillon., MD. With the following instrument(s): Curette to remove Viable and Non-Viable tissue/material. Material removed includes Subcutaneous Tissue, Skin: Dermis, and Skin: Epidermis after achieving pain control using Lidocaine. No specimens were taken. A time out was conducted at 08:25, prior to the start of the procedure. A Minimum amount of bleeding was controlled with Pressure. The procedure was tolerated well with a pain level of 0 throughout and a pain level of 0 following the procedure. Post Debridement Measurements: 1.6cm length x 1.2cm width x 0.4cm depth; 0.603cm^3 volume. Character of Wound/Ulcer Post Debridement is improved. Severity of Tissue Post Debridement is: Fat layer exposed. Post procedure Diagnosis Wound #2: Same as Pre-Procedure Pre-procedure diagnosis of Wound #2 is a Diabetic Wound/Ulcer of the Lower Extremity  located on the Right Metatarsal head first . There was a T Contact otal Cast Procedure by Ricard Dillon., MD. Post procedure Diagnosis Wound #2: Same as Pre-Procedure Plan Follow-up Appointments: Return Appointment in 1 week. - with Dr. Dellia Nims ****EXTRA TIME 60 MINUTES**** Return Appointment in: - on Friday of this week 12/09 for first TCC change ****EXTRA TIME 60 MINUTES**** Bathing/ Shower/ Hygiene: May shower and wash wound with soap and water. - with dressing changes Off-Loading: Wound #2 Right Metatarsal head first: T Contact Cast to Right Lower Extremity otal Wedge shoe to: - right foot daily WOUND #2: - Metatarsal head first Wound Laterality: Right Cleanser: Soap and Water 1 x Per Day/30 Days Discharge Instructions: May shower and wash wound with dial antibacterial soap and water prior to dressing change. Cleanser: Wound Cleanser 1 x Per Day/30 Days Discharge Instructions: Cleanse the wound with wound cleanser prior to applying a clean dressing using gauze sponges, not tissue or cotton balls. Peri-Wound Care: Sween Lotion (Moisturizing lotion) 1 x Per Day/30 Days Discharge Instructions: Apply moisturizing lotion to foot with dressing changes Prim Dressing: KerraCel Ag Gelling Fiber Dressing, 2x2 in (silver alginate) (Generic) 1 x Per Day/30 Days ary Discharge Instructions: Apply silver alginate to wound bed as instructed Secondary Dressing: Woven Gauze Sponge, Non-Sterile 4x4 in (Generic) 1 x Per Day/30 Days Discharge Instructions: Apply over primary dressing as directed. Secondary Dressing: Optifoam Non-Adhesive Dressing, 4x4 in (Generic) 1 x Per Day/30 Days Discharge Instructions: Apply over primary dressing cut to make foam donut Secured With: Conforming Stretch Gauze Bandage, Sterile 2x75 (in/in) (Generic) 1 x Per Day/30 Days Discharge Instructions: Secure with stretch gauze as directed. Secured With: Paper T ape, 2x10 (in/yd) (Generic) 1 x Per Day/30 Days Discharge  Instructions: Secure dressing with tape as directed. 1. Still silver alginate as the primary dressing 2. After discussion with the patient we went ahead and put him in a total contact cast after which he told me he was used to this from having one of his left foot applied by [facility] last year Electronic Signature(s) Signed: 04/30/2021 4:21:48 PM By: Linton Ham MD Entered By: Linton Ham on 04/30/2021 09:07:38 -------------------------------------------------------------------------------- Total Contact Cast Details Patient Name: Date of Service: [name], [name] 04/30/2021 8:00 A M Medical Record Number: [id number] Patient Account Number: [id number] Date of Birth/Sex: Treating RN: 01/23/1961 (60 y.o. M) Primary Care Provider:  [name] Other Clinician: Referring Provider: Treating Provider/Extender: Lynden Ang in Treatment: 5 T Contact Cast Applied for Wound Assessment: otal Wound #2 Right Metatarsal head first Performed By: Physician Ricard Dillon., MD Post Procedure Diagnosis Same as Pre-procedure Electronic Signature(s) Signed: 04/30/2021 4:21:48 PM By: Linton Ham MD Entered By: Linton Ham on 04/30/2021 09:05:19 -------------------------------------------------------------------------------- SuperBill Details Patient Name: Date of Service: [name], [name] 04/30/2021 Medical Record Number: [id number] Patient Account Number: [id number] Date of Birth/Sex: Treating RN: 12-28-1960 (60 y.o. 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 severity E11.40 Type 2 diabetes mellitus with diabetic neuropathy,  unspecified Facility Procedures CPT4 Code: [id number] Description: 42353 - DEB SUBQ TISSUE 20 SQ CM/< ICD-10 Diagnosis Description L97.518 Non-pressure chronic ulcer of other part of right foot with other specified sev Modifier: erity Quantity: 1 Physician Procedures : CPT4 Code Description Modifier [id number] - WC PHYS SUBQ TISS 20 SQ CM ICD-10 Diagnosis Description 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

## 2021-05-03 ENCOUNTER — Other Ambulatory Visit: Payer: Self-pay

## 2021-05-03 ENCOUNTER — Encounter (HOSPITAL_BASED_OUTPATIENT_CLINIC_OR_DEPARTMENT_OTHER): Payer: BC Managed Care – PPO | Admitting: Internal Medicine

## 2021-05-03 DIAGNOSIS — E1140 Type 2 diabetes mellitus with diabetic neuropathy, unspecified: Secondary | ICD-10-CM | POA: Diagnosis not present

## 2021-05-03 DIAGNOSIS — L97518 Non-pressure chronic ulcer of other part of right foot with other specified severity: Secondary | ICD-10-CM | POA: Diagnosis not present

## 2021-05-03 DIAGNOSIS — E11621 Type 2 diabetes mellitus with foot ulcer: Secondary | ICD-10-CM

## 2021-05-03 NOTE — Progress Notes (Signed)
Raymond [name], Raymond [name] ([id number]) Visit Report for 05/03/2021 Arrival Information Details Patient Name: Date of Service: Raymond [name] RO LD 05/03/2021 8:45 A M Medical Record Number: [id number] Patient Account Number: [id number] Date of Birth/Sex: Treating RN: 06-27-1960 (60 y.o. Raymond [name] Primary Care Raymond [name]: Raymond [name] Other Clinician: Referring Raymond [name]: Treating Raymond [name]/Extender: Ilda Foil in Treatment: 5 Visit Information History Since Last Visit Added or deleted any medications: No Patient Arrived: Ambulatory Any new allergies or adverse reactions: No Arrival Time: 08:36 Had a fall or experienced change in No Accompanied By: self activities of daily living that may affect Transfer Assistance: None risk of falls: Patient Identification Verified: Yes Signs or symptoms of abuse/neglect since last visito No Secondary Verification Process Completed: Yes Hospitalized since last visit: No Patient Requires Transmission-Based Precautions: No Implantable device outside of the clinic excluding No Patient Has Alerts: No cellular tissue based products placed in the center since last visit: Has Dressing in Place as Prescribed: Yes Has Footwear/Offloading in Place as Prescribed: Yes Right: T Contact Cast otal Pain Present Now: No Electronic Signature(s) Signed: 05/03/2021 12:28:25 PM By: Raymond Gouty RN, BSN Entered By: Raymond [name] on 05/03/2021 08:40:05 -------------------------------------------------------------------------------- Encounter Discharge Information Details Patient Name: Date of Service: Raymond [name], [name] 05/03/2021 8:45 A M Medical Record Number: [id number] Patient Account Number: [id number] Date of Birth/Sex: Treating RN: 12-Nov-1960 (60 y.o. Raymond [name], Raymond [name] Primary Care Raymond [name]: Raymond [name] Other Clinician: Referring Lavana Huckeba: Treating Raymond [name]/Extender: Ilda Foil in Treatment:  5 Encounter Discharge Information Items Discharge Condition: Stable Ambulatory Status: Ambulatory Discharge Destination: Home Transportation: Private Auto Accompanied By: self Schedule Follow-up Appointment: Yes Clinical Summary of Care: Patient Declined Electronic Signature(s) Signed: 05/03/2021 11:46:22 AM By: Raymond Hammock RN Entered By: Raymond [name] on 05/03/2021 09:33:12 -------------------------------------------------------------------------------- Lower Extremity Assessment Details Patient Name: Date of Service: Raymond [name], [name] 05/03/2021 8:45 A M Medical Record Number: [id number] Patient Account Number: [id number] Date of Birth/Sex: Treating RN: 1961-03-03 (60 y.o. [name] Primary Care Raymond [name]: Raymond [name] Other Clinician: Referring Raymond [name]: Treating Kaymon Denomme/Extender: Raymond [name] in Treatment: 5 Edema Assessment Assessed: [Left: No] [Right: No] Edema: [Left: Ye] [Right: s] Calf Left: Right: Point of Measurement: 35 cm From Medial Instep 38.3 cm Ankle Left: Right: Point of Measurement: 12 cm From Medial Instep 23 cm Electronic Signature(s) Signed: 05/03/2021 12:23:05 PM By: Raymond Catholic RN Entered By: Raymond [name] on 05/03/2021 09:00:03 -------------------------------------------------------------------------------- Multi Wound Chart Details Patient Name: Date of Service: Raymond [name], [name] 05/03/2021 8:45 A M Medical Record Number: [id number] Patient Account Number: [id number] Date of Birth/Sex: Treating RN: 04-18-1961 (60 y.o. Raymond [name] Primary Care Raymond [name]: Raymond [name] Other Clinician: Referring Raymond [name]: Treating Raymond [name]/Extender: Raymond [name] in Treatment: 5 Vital Signs Height(in): 68 Capillary Blood Glucose(mg/dl): 297 Weight(lbs): 224 Pulse(bpm): 30 Body Mass Index(BMI): 34 Blood Pressure(mmHg): 181/96 Temperature(F): 98.3 Respiratory  Rate(breaths/min): 18 Photos: [2:No Photos Right Metatarsal head first] [N/A:N/A N/A] Wound Location: [2:Blister] [N/A:N/A] Wounding Event: [2:Diabetic Wound/Ulcer of the Lower] [N/A:N/A] Primary Etiology: [2:Extremity Cataracts, Hypertension, Peripheral] [N/A:N/A] Comorbid History: [2:Arterial Disease, Type II Diabetes, Osteomyelitis, Neuropathy 05/27/2019] [N/A:N/A] Date Acquired: [2:5] [N/A:N/A] [name] of Treatment: [2:Open] [N/A:N/A] Wound Status: [2:1.5x1.1x0.2] [N/A:N/A] Measurements L x W x D (cm) [2:1.296] [N/A:N/A] A (cm) : rea [2:0.259] [N/A:N/A] Volume (cm) : [2:64.10%] [N/A:N/A] % Reduction in Area: [2:88.10%] [N/A:N/A] % Reduction in Volume: [2:Grade 2] [N/A:N/A] Classification: [2:Medium] [N/A:N/A] Exudate A mount: [2:Serosanguineous] [N/A:N/A] Exudate Type: [2:red, brown] [N/A:N/A] Exudate  Color: [2:Well defined, not attached] [N/A:N/A] Wound Margin: [2:Large (67-100%)] [N/A:N/A] Granulation A mount: [2:Red, Pink] [N/A:N/A] Granulation Quality: [2:None Present (0%)] [N/A:N/A] Necrotic A mount: [2:Fat Layer (Subcutaneous Tissue): Yes N/A] Exposed Structures: [2:Fascia: No Tendon: No Muscle: No Joint: No Bone: No Small (1-33%)] [N/A:N/A] Epithelialization: [2:Chemical/Enzymatic/Mechanical] [N/A:N/A] Debridement: [2:Lidocaine] [N/A:N/A] Pain Control: [2:N/A] [N/A:N/A] Instrument: [2:Minimum] [N/A:N/A] Bleeding: [2:Pressure] [N/A:N/A] Hemostasis A chieved: [2:0] [N/A:N/A] Procedural Pain: [2:0] [N/A:N/A] Post Procedural Pain: Debridement Treatment Response: Procedure was tolerated well [N/A:N/A] Post Debridement Measurements L x 1.5x1.1x0.2 [N/A:N/A] W x D (cm) [2:0.259] [N/A:N/A] Post Debridement Volume: (cm) [2:Debridement] [N/A:N/A] Procedures Performed: [2:T Contact Cast otal] Treatment Notes Wound #2 (Metatarsal head first) Wound Laterality: Right Cleanser Soap and Water Discharge Instruction: May shower and wash wound with dial antibacterial soap and water  prior to dressing change. Wound Cleanser Discharge Instruction: Cleanse the wound with wound cleanser prior to applying a clean dressing using gauze sponges, not tissue or cotton balls. Peri-Wound Care Sween Lotion (Moisturizing lotion) Discharge Instruction: Apply moisturizing lotion to foot with dressing changes Topical Primary Dressing KerraCel Ag Gelling Fiber Dressing, 2x2 in (silver alginate) Discharge Instruction: Apply silver alginate to wound bed as instructed Secondary Dressing Woven Gauze Sponge, Non-Sterile 4x4 in Discharge Instruction: Apply over primary dressing as directed. Optifoam Non-Adhesive Dressing, 4x4 in Discharge Instruction: Apply over primary dressing cut to make foam donut Secured With Conforming Stretch Gauze Bandage, Sterile 2x75 (in/in) Discharge Instruction: Secure with stretch gauze as directed. Paper Tape, 2x10 (in/yd) Discharge Instruction: Secure dressing with tape as directed. Compression Wrap Compression Stockings Add-Ons Electronic Signature(s) Signed: 05/03/2021 10:25:22 AM By: Kalman Shan DO Signed: 05/03/2021 12:28:25 PM By: Raymond Gouty RN, BSN Entered By: Kalman Shan on 05/03/2021 09:52:04 -------------------------------------------------------------------------------- Multi-Disciplinary Care Plan Details Patient Name: Date of Service: Raymond [name], [name] 05/03/2021 8:45 A M Medical Record Number: [id number] Patient Account Number: [id number] Date of Birth/Sex: Treating RN: 11-Apr-1961 (60 y.o. Raymond [name], Raymond [name] Primary Care Austan Nicholl: Raymond [name] Other Clinician: Referring Mycala Warshawsky: Treating Otniel Hoe/Extender: Ilda Foil in Treatment: Lamar reviewed with physician Active Inactive Nutrition Nursing Diagnoses: Impaired glucose control: actual or potential Potential for alteratiion in Nutrition/Potential for imbalanced nutrition Goals: Patient/caregiver verbalizes  understanding of need to maintain therapeutic glucose control per primary care physician Date Initiated: 03/26/2021 Date Inactivated: 04/23/2021 Target Resolution Date: 04/23/2021 Goal Status: Met Patient/caregiver will maintain therapeutic glucose control Date Initiated: 03/26/2021 Target Resolution Date: 05/24/2021 Goal Status: Active Interventions: Assess HgA1c results as ordered upon admission and as needed Provide education on elevated blood sugars and impact on wound healing Treatment Activities: Dietary management education, guidance and counseling : 03/26/2021 Special diet education : 03/26/2021 Notes: Wound/Skin Impairment Nursing Diagnoses: Impaired tissue integrity Knowledge deficit related to ulceration/compromised skin integrity Goals: Patient/caregiver will verbalize understanding of skin care regimen Date Initiated: 03/26/2021 Target Resolution Date: 05/24/2021 Goal Status: Active Ulcer/skin breakdown will have a volume reduction of 30% by week 4 Date Initiated: 03/26/2021 Date Inactivated: 04/23/2021 Target Resolution Date: 04/23/2021 Goal Status: Met Interventions: Assess patient/caregiver ability to obtain necessary supplies Assess patient/caregiver ability to perform ulcer/skin care regimen upon admission and as needed Assess ulceration(s) every visit Provide education on ulcer and skin care Treatment Activities: Skin care regimen initiated : 03/26/2021 Topical wound management initiated : 03/26/2021 Notes: Electronic Signature(s) Signed: 05/03/2021 11:46:22 AM By: Raymond Hammock RN Entered By: Raymond [name] on 05/03/2021 09:31:39 -------------------------------------------------------------------------------- Pain Assessment Details Patient Name: Date of Service: Raymond [name], [name] 05/03/2021 8:45 A M Medical  Record Number: [id number] Patient Account Number: [id number] Date of Birth/Sex: Treating RN: [birth year] (60 y.o. Raymond [name] Primary  Care Harue Pribble: Raymond [name] Other Clinician: Referring Yoali Conry: Treating Sophya Vanblarcom/Extender: Raymond [name] in Treatment: 5 Active Problems Location of Pain Severity and Description of Pain Patient Has Paino No Site Locations Pain Management and Medication Current Pain Management: Electronic Signature(s) Signed: 05/03/2021 12:28:25 PM By: Raymond Gouty RN, BSN Entered By: Raymond [name] on 05/03/2021 08:50:00 -------------------------------------------------------------------------------- Patient/Caregiver Education Details Patient Name: Date of Service: Raymond [name], [name] 12/9/2022andnbsp8:45 A M Medical Record Number: [id number] Patient Account Number: [id number] Date of Birth/Gender: Treating RN: 1960-12-31 (60 y.o. [name] Primary Care Physician: Raymond [name] Other Clinician: Referring Physician: Treating Physician/Extender: Ilda Foil in Treatment: 5 Education Assessment Education Provided To: Patient Education Topics Provided Wound/Skin Impairment: Methods: Explain/Verbal Responses: State content correctly Electronic Signature(s) Signed: 05/03/2021 11:46:22 AM By: Raymond Hammock RN Entered By: Raymond [name] on 05/03/2021 09:31:51 -------------------------------------------------------------------------------- Wound Assessment Details Patient Name: Date of Service: Raymond [name], [name] 05/03/2021 8:45 A M Medical Record Number: [id number] Patient Account Number: [id number] Date of Birth/Sex: Treating RN: 02-15-1961 (60 y.o. [name] Primary Care Channing Yeager: Raymond [name] Other Clinician: Referring Detra Bores: Treating Rolando Hessling/Extender: Raymond [name] in Treatment: 5 Wound Status Wound Number: 2 Primary Diabetic Wound/Ulcer of the Lower Extremity Etiology: Wound Location: Right Metatarsal head first Wound Open Wounding Event: Blister Status: Date Acquired:  05/27/2019 Comorbid Cataracts, Hypertension, Peripheral Arterial Disease, Type II [name] Of Treatment: 5 History: Diabetes, Osteomyelitis, Neuropathy Clustered Wound: No Wound Measurements Length: (cm) 1.5 Width: (cm) 1.1 Depth: (cm) 0.2 Area: (cm) 1.296 Volume: (cm) 0.259 % Reduction in Area: 64.1% % Reduction in Volume: 88.1% Epithelialization: Small (1-33%) Tunneling: No Undermining: No Wound Description Classification: Grade 2 Wound Margin: Well defined, not attached Exudate Amount: Medium Exudate Type: Serosanguineous Exudate Color: red, brown Foul Odor After Cleansing: No Slough/Fibrino No Wound Bed Granulation Amount: Large (67-100%) Exposed Structure Granulation Quality: Red, Pink Fascia Exposed: No Necrotic Amount: None Present (0%) Fat Layer (Subcutaneous Tissue) Exposed: Yes Tendon Exposed: No Muscle Exposed: No Joint Exposed: No Bone Exposed: No Treatment Notes Wound #2 (Metatarsal head first) Wound Laterality: Right Cleanser Soap and Water Discharge Instruction: May shower and wash wound with dial antibacterial soap and water prior to dressing change. Wound Cleanser Discharge Instruction: Cleanse the wound with wound cleanser prior to applying a clean dressing using gauze sponges, not tissue or cotton balls. Peri-Wound Care Sween Lotion (Moisturizing lotion) Discharge Instruction: Apply moisturizing lotion to foot with dressing changes Topical Primary Dressing KerraCel Ag Gelling Fiber Dressing, 2x2 in (silver alginate) Discharge Instruction: Apply silver alginate to wound bed as instructed Secondary Dressing Woven Gauze Sponge, Non-Sterile 4x4 in Discharge Instruction: Apply over primary dressing as directed. Optifoam Non-Adhesive Dressing, 4x4 in Discharge Instruction: Apply over primary dressing cut to make foam donut Secured With Conforming Stretch Gauze Bandage, Sterile 2x75 (in/in) Discharge Instruction: Secure with stretch gauze as  directed. Paper Tape, 2x10 (in/yd) Discharge Instruction: Secure dressing with tape as directed. Compression Wrap Compression Stockings Add-Ons Electronic Signature(s) Signed: 05/03/2021 12:23:05 PM By: Raymond Catholic RN Entered By: Raymond [name] on 05/03/2021 08:55:25 -------------------------------------------------------------------------------- Vitals Details Patient Name: Date of Service: Raymond [name], [name] 05/03/2021 8:45 A M Medical Record Number: [id number] Patient Account Number: [id number] Date of Birth/Sex: Treating RN: 04/18/1961 (61 y.o. Raymond [name] Primary Care Kahley Leib: Raymond [name] Other Clinician: Referring Jashawna Reever: Treating Judith Campillo/Extender: Raymond Fava  [name] in Treatment: 5 Vital Signs Time Taken: 08:40 Temperature (F): 98.3 Height (in): 68 Pulse (bpm): 68 Source: Stated Respiratory Rate (breaths/min): 18 Weight (lbs): 224 Blood Pressure (mmHg): 181/96 Source: Stated Capillary Blood Glucose (mg/dl): 297 Body Mass Index (BMI): 34.1 Reference Range: 80 - 120 mg / dl Notes glucose per pt report yesterday Electronic Signature(s) Signed: 05/03/2021 12:23:05 PM By: Raymond Catholic RN Entered By: Raymond [name] on 05/03/2021 09:03:23

## 2021-05-03 NOTE — Progress Notes (Signed)
Raymond [name] ([id number]) Visit Report for 05/03/2021 Chief Complaint Document Details Patient Name: Date of Service: Raymond [name], [name] [name] 05/03/2021 8:45 A M Medical Record Number: [id number] Patient Account Number: [id number] Date of Birth/Sex: Treating RN: Jun 09, 1960 (60 y.o. Raymond [name] Primary Care Provider: Minette [name] Other Clinician: Referring Provider: Treating Provider/Extender: Raymond [name] in Treatment: 5 Information Obtained from: Patient Chief Complaint 02/21/2019; patient is here for review of a ulcer on the tip of the left great toe 03/26/2021; right plantar foot wound Electronic Signature(s) Signed: 05/03/2021 10:25:22 AM By: Raymond Shan DO Entered By: Raymond [name] on 05/03/2021 09:52:14 -------------------------------------------------------------------------------- Debridement Details Patient Name: Date of Service: Raymond [name] 05/03/2021 8:45 A M Medical Record Number: [id number] Patient Account Number: [id number] Date of Birth/Sex: Treating RN: 1961-03-20 (60 y.o. Raymond [name], Raymond [name] Primary Care Provider: Minette [name] Other Clinician: Referring Provider: Treating Provider/Extender: Raymond [name] in Treatment: 5 Debridement Performed for Assessment: Wound #2 Right Metatarsal head first Performed By: Physician Raymond Shan, DO Debridement Type: Chemical/Enzymatic/Mechanical Agent Used: gauze and NS Severity of Tissue Pre Debridement: Fat layer exposed Level of Consciousness (Pre-procedure): Awake and Alert Pre-procedure Verification/Time Out No Taken: Pain Control: Lidocaine Bleeding: Minimum Hemostasis Achieved: Pressure End Time: 09:40 Procedural Pain: 0 Post Procedural Pain: 0 Response to Treatment: Procedure was tolerated well Level of Consciousness (Post- Awake and Alert procedure): Post Debridement Measurements of Total Wound Length: (cm) 1.5 Width: (cm) 1.1 Depth: (cm)  0.2 Volume: (cm) 0.259 Character of Wound/Ulcer Post Debridement: Improved Severity of Tissue Post Debridement: Fat layer exposed Post Procedure Diagnosis Same as Pre-procedure Electronic Signature(s) Signed: 05/03/2021 10:25:22 AM By: Raymond Shan DO Signed: 05/03/2021 11:46:22 AM By: Rhae Hammock RN Entered By: Rhae Hammock on 05/03/2021 09:42:46 -------------------------------------------------------------------------------- HPI Details Patient Name: Date of Service: Raymond [name] 05/03/2021 8:45 A M Medical Record Number: [id number] Patient Account Number: [id number] Date of Birth/Sex: Treating RN: [birth year] (60 y.o. Raymond [name] Primary Care Provider: Minette [name] Other Clinician: Referring Provider: Treating Provider/Extender: Raymond [name] in Treatment: 5 History of Present Illness HPI Description: Admission 03/26/2021 [name] is a 60 year old male with a past medical history of uncontrolled type 2 diabetes on insulin with latest hemoglobin A1c of 15, previous left foot wound with osteomyelitis, peripheral arterial disease, status post kidney transplant, that presents to the clinic for a 1.5-year history of nonhealing wound to the plantar aspect of his right foot. He has been following with Raymond [name], podiatry for this issue. He had surgical debridement with bone biopsy on 03/06/2021. Biopsy did not show evidence for osteomyelitis. At that time ACell was placed. He also follows with Raymond [name] for antibiotic therapy and is currently on linezolid and Augmentin. He currently denies systemic signs of infection. He has been using Vaseline to the wound bed. He currently uses a front offloading shoe. He is following Dr. Loanne [name] for his diabetes. 11/29; patient has not been here in 4 [name]. 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 He was given silver alginate and a surgical shoe and the wound is  actually come in by 1 cm in width which is quite a big improvement. He has been evaluated by Dr. Gwenlyn [name] because of a history of PAD. Last seen on 04/03/2021. Repeat noninvasive studies were done on 04/12/2021 showing a noncompressible ABI on the right but a TBI of 0.95 but monophasic waveforms. On the left also noncompressible with monophasic  waveforms but with a TBI of 0.68. The normal TBI's bilaterally suggest adequate blood flow 12/6; the wound was dramatically better last week. Slightly smaller this week but unfortunately the tissue was nonadherent. He has been using silver alginate with a surgical shoe 12/9; patient presents for first cast change. Has no issues or complaints today. Electronic Signature(s) Signed: 05/03/2021 10:25:22 AM By: Raymond Shan DO Entered By: Raymond [name] on 05/03/2021 09:52:42 -------------------------------------------------------------------------------- Physical Exam Details Patient Name: Date of Service: Raymond [name] 05/03/2021 8:45 A M Medical Record Number: [id number] Patient Account Number: [id number] Date of Birth/Sex: Treating RN: 18-Dec-1960 (60 y.o. Raymond [name] Primary Care Provider: Minette [name] Other Clinician: Referring Provider: Treating Provider/Extender: Raymond [name] in Treatment: 5 Constitutional respirations regular, non-labored and within target range for patient.. Cardiovascular 2+ dorsalis pedis/posterior tibialis pulses. Psychiatric pleasant and cooperative. Notes Right foot: T the first met head there is a circular wound with granulation tissue. No obvious signs of infection. o Electronic Signature(s) Signed: 05/03/2021 10:25:22 AM By: Raymond Shan DO Entered By: Raymond [name] on 05/03/2021 09:53:51 -------------------------------------------------------------------------------- Physician Orders Details Patient Name: Date of Service: Raymond [name] 05/03/2021 8:45 A M Medical  Record Number: [id number] Patient Account Number: [id number] Date of Birth/Sex: Treating RN: [birth year] (60 y.o. Raymond [name] Primary Care Provider: Minette [name] Other Clinician: Referring Provider: Treating Provider/Extender: Raymond [name] in Treatment: 5 Verbal / Phone Orders: No Diagnosis Coding Follow-up Appointments ppointment in: - on Tuesday with Dr. Dellia Nims ****EXTRA TIME 60 MINUTES**** Return A Bathing/ Shower/ Hygiene May shower and wash wound with soap and water. - with dressing changes Off-Loading Wound #2 Right Metatarsal head first Total Contact Cast to Right Lower Extremity Wedge shoe to: - right foot daily Wound Treatment Wound #2 - Metatarsal head first Wound Laterality: Right Cleanser: Soap and Water 1 x Per Day/30 Days Discharge Instructions: May shower and wash wound with dial antibacterial soap and water prior to dressing change. Cleanser: Wound Cleanser 1 x Per Day/30 Days Discharge Instructions: Cleanse the wound with wound cleanser prior to applying a clean dressing using gauze sponges, not tissue or cotton balls. Peri-Wound Care: Sween Lotion (Moisturizing lotion) 1 x Per Day/30 Days Discharge Instructions: Apply moisturizing lotion to foot with dressing changes Prim Dressing: KerraCel Ag Gelling Fiber Dressing, 2x2 in (silver alginate) (Generic) 1 x Per Day/30 Days ary Discharge Instructions: Apply silver alginate to wound bed as instructed Secondary Dressing: Woven Gauze Sponge, Non-Sterile 4x4 in (Generic) 1 x Per Day/30 Days Discharge Instructions: Apply over primary dressing as directed. Secondary Dressing: Optifoam Non-Adhesive Dressing, 4x4 in (Generic) 1 x Per Day/30 Days Discharge Instructions: Apply over primary dressing cut to make foam donut Secured With: Conforming Stretch Gauze Bandage, Sterile 2x75 (in/in) (Generic) 1 x Per Day/30 Days Discharge Instructions: Secure with stretch gauze as directed. Secured With:  Paper Tape, 2x10 (in/yd) (Generic) 1 x Per Day/30 Days Discharge Instructions: Secure dressing with tape as directed. Electronic Signature(s) Signed: 05/03/2021 10:25:22 AM By: Raymond Shan DO Entered By: Raymond [name] on 05/03/2021 09:54:16 -------------------------------------------------------------------------------- Problem List Details Patient Name: Date of Service: Raymond [name], [place] [name] 05/03/2021 8:45 A M Medical Record Number: [id number] Patient Account Number: [id number] Date of Birth/Sex: Treating RN: [birth year] (60 y.o. Raymond [name] Primary Care Provider: Minette [name] Other Clinician: Referring Provider: Treating Provider/Extender: Raymond [name] in Treatment: 5 Active Problems ICD-10 Encounter Code Description Active Date MDM Diagnosis E11.621 Type 2 diabetes mellitus with  foot ulcer 03/26/2021 No Yes L97.518 Non-pressure chronic ulcer of other part of right foot with other specified 04/23/2021 No Yes severity E11.40 Type 2 diabetes mellitus with diabetic neuropathy, unspecified 03/26/2021 No Yes Inactive Problems Resolved Problems Electronic Signature(s) Signed: 05/03/2021 10:25:22 AM By: Raymond Shan DO Entered By: Raymond [name] on 05/03/2021 09:51:57 -------------------------------------------------------------------------------- Progress Note Details Patient Name: Date of Service: Raymond [name] 05/03/2021 8:45 A M Medical Record Number: [id number] Patient Account Number: [id number] Date of Birth/Sex: Treating RN: 1961/01/09 (60 y.o. Raymond [name] Primary Care Provider: Minette [name] Other Clinician: Referring Provider: Treating Provider/Extender: Raymond [name] in Treatment: 5 Subjective Chief Complaint Information obtained from Patient 02/21/2019; patient is here for review of a ulcer on the tip of the left great toe 03/26/2021; right plantar foot wound History of Present Illness  (HPI) Admission 03/26/2021 Raymond [name] is a 60 year old male with a past medical history of uncontrolled type 2 diabetes on insulin with latest hemoglobin A1c of 15, previous left foot wound with osteomyelitis, peripheral arterial disease, status post kidney transplant, that presents to the clinic for a 1.5-year history of nonhealing wound to the plantar aspect of his right foot. He has been following with Raymond [name], podiatry for this issue. He had surgical debridement with bone biopsy on 03/06/2021. Biopsy did not show evidence for osteomyelitis. At that time ACell was placed. He also follows with Raymond [name] for antibiotic therapy and is currently on linezolid and Augmentin. He currently denies systemic signs of infection. He has been using Vaseline to the wound bed. He currently uses a front offloading shoe. He is following Dr. Loanne [name] for his diabetes. 11/29; patient has not been here in 4 [name]. 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 He was given silver alginate and a surgical shoe and the wound is actually come in by 1 cm in width which is quite a big improvement. He has been evaluated by Dr. Gwenlyn [name] because of a history of PAD. Last seen on 04/03/2021. Repeat noninvasive studies were done on 04/12/2021 showing a noncompressible ABI on the right but a TBI of 0.95 but monophasic waveforms. On the left also noncompressible with monophasic waveforms but with a TBI of 0.68. The normal TBI's bilaterally suggest adequate blood flow 12/6; the wound was dramatically better last week. Slightly smaller this week but unfortunately the tissue was nonadherent. He has been using silver alginate with a surgical shoe 12/9; patient presents for first cast change. Has no issues or complaints today. Patient History Information obtained from Patient. Family History Cancer - Mother,Siblings, Diabetes - Mother,Siblings,Paternal Grandparents, Heart Disease - Paternal Grandparents,  Hypertension - Mother,Father,Siblings, Kidney Disease - Mother, No family history of Hereditary Spherocytosis, Lung Disease, Seizures, Stroke, Thyroid Problems, Tuberculosis. Social History Never smoker, Marital Status - Married, Alcohol Use - Rarely, Drug Use - No History, Caffeine Use - Daily. Medical History Eyes Patient has history of Cataracts - bil removed Denies history of Glaucoma, Optic Neuritis Ear/Nose/Mouth/Throat Denies history of Chronic sinus problems/congestion, Middle ear problems Hematologic/Lymphatic Denies history of Anemia, Hemophilia, Human Immunodeficiency Virus, Lymphedema, Sickle Cell Disease Respiratory Denies history of Aspiration, Asthma, Chronic Obstructive Pulmonary Disease (COPD), Pneumothorax, Sleep Apnea, Tuberculosis Cardiovascular Patient has history of Hypertension, Peripheral Arterial Disease Denies history of Angina, Arrhythmia, Congestive Heart Failure, Coronary Artery Disease, Deep Vein Thrombosis, Hypotension, Myocardial Infarction, Peripheral Venous Disease, Phlebitis, Vasculitis Gastrointestinal Denies history of Cirrhosis , Colitis, Crohnoos, Hepatitis A, Hepatitis B, Hepatitis C Endocrine Patient  has history of Type II Diabetes Denies history of Type I Diabetes Genitourinary Denies history of End Stage Renal Disease Immunological Denies history of Lupus Erythematosus, Raynaudoos, Scleroderma Integumentary (Skin) Denies history of History of Burn Musculoskeletal Patient has history of Osteomyelitis Denies history of Gout, Rheumatoid Arthritis, Osteoarthritis Neurologic Patient has history of Neuropathy Denies history of Dementia, Quadriplegia, Paraplegia, Seizure Disorder Oncologic Denies history of Received Chemotherapy, Received Radiation Psychiatric Denies history of Anorexia/bulimia, Confinement Anxiety Hospitalization/Surgery History - bone biopsy. - kidney transplant. - penile pump insertion. - graft application. Medical A  Surgical History Notes nd Constitutional Symptoms (General Health) obesity Eyes diabetic retinopathy right eye Cardiovascular hyperlipidemia Genitourinary hx kidney transplant Objective Constitutional respirations regular, non-labored and within target range for patient.. Vitals Time Taken: 8:40 AM, Height: 68 in, Source: Stated, Weight: 224 lbs, Source: Stated, BMI: 34.1, Temperature: 98.3 F, Pulse: 68 bpm, Respiratory Rate: 18 breaths/min, Blood Pressure: 181/96 mmHg, Capillary Blood Glucose: 297 mg/dl. General Notes: glucose per pt report yesterday Cardiovascular 2+ dorsalis pedis/posterior tibialis pulses. Psychiatric pleasant and cooperative. General Notes: Right foot: T the first met head there is a circular wound with granulation tissue. No obvious signs of infection. o Integumentary (Hair, Skin) Wound #2 status is Open. Original cause of wound was Blister. The date acquired was: 05/27/2019. The wound has been in treatment 5 [name]. The wound is located on the Right Metatarsal head first. The wound measures 1.5cm length x 1.1cm width x 0.2cm depth; 1.296cm^2 area and 0.259cm^3 volume. There is Fat Layer (Subcutaneous Tissue) exposed. There is no tunneling or undermining noted. There is a medium amount of serosanguineous drainage noted. The wound margin is well defined and not attached to the wound base. There is large (67-100%) red, pink granulation within the wound bed. There is no necrotic tissue within the wound bed. 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 His wound appears stable. No signs of infection. Surrounding skin is intact. The next total contact cast was placed in standard fashion today With silver alginate. Follow-up  next week Procedures Wound #2 Pre-procedure diagnosis of Wound #2 is a Diabetic Wound/Ulcer of the Lower Extremity located on the Right Metatarsal head first .Severity of Tissue Pre Debridement is: Fat layer exposed. There was a Chemical/Enzymatic/Mechanical debridement performed by Raymond Shan, DO. after achieving pain control using Lidocaine. Other agent used was gauze and NS. A Minimum amount of bleeding was controlled with Pressure. The procedure was tolerated well with a pain level of 0 throughout and a pain level of 0 following the procedure. Post Debridement Measurements: 1.5cm length x 1.1cm width x 0.2cm depth; 0.259cm^3 volume. Character of Wound/Ulcer Post Debridement is improved. Severity of Tissue Post Debridement is: Fat layer exposed. Post procedure Diagnosis Wound #2: Same as Pre-Procedure Pre-procedure diagnosis of Wound #2 is a Diabetic Wound/Ulcer of the Lower Extremity located on the Right Metatarsal head first . There was a T Contact otal Cast Procedure by Raymond Shan, DO. Post procedure Diagnosis Wound #2: Same as Pre-Procedure Plan Follow-up Appointments: Return Appointment in: - on Tuesday with Dr. Dellia Nims ****EXTRA TIME 60 MINUTES**** Bathing/ Shower/ Hygiene: May shower and wash wound with soap and water. - with dressing changes Off-Loading: Wound #2 Right Metatarsal head first: T Contact Cast to Right Lower Extremity otal Wedge shoe to: -  right foot daily WOUND #2: - Metatarsal head first Wound Laterality: Right Cleanser: Soap and Water 1 x Per Day/30 Days Discharge Instructions: May shower and wash wound with dial antibacterial soap and water prior to dressing change. Cleanser: Wound Cleanser 1 x Per Day/30 Days Discharge Instructions: Cleanse the wound with wound cleanser prior to applying a clean dressing using gauze sponges, not tissue or cotton balls. Peri-Wound Care: Sween Lotion (Moisturizing lotion) 1 x Per Day/30 Days Discharge Instructions:  Apply moisturizing lotion to foot with dressing changes Prim Dressing: KerraCel Ag Gelling Fiber Dressing, 2x2 in (silver alginate) (Generic) 1 x Per Day/30 Days ary Discharge Instructions: Apply silver alginate to wound bed as instructed Secondary Dressing: Woven Gauze Sponge, Non-Sterile 4x4 in (Generic) 1 x Per Day/30 Days Discharge Instructions: Apply over primary dressing as directed. Secondary Dressing: Optifoam Non-Adhesive Dressing, 4x4 in (Generic) 1 x Per Day/30 Days Discharge Instructions: Apply over primary dressing cut to make foam donut Secured With: Conforming Stretch Gauze Bandage, Sterile 2x75 (in/in) (Generic) 1 x Per Day/30 Days Discharge Instructions: Secure with stretch gauze as directed. Secured With: Paper T ape, 2x10 (in/yd) (Generic) 1 x Per Day/30 Days Discharge Instructions: Secure dressing with tape as directed. 1. Silver alginate with TCC 2. Follow-up next week Electronic Signature(s) Signed: 05/03/2021 10:25:22 AM By: Raymond Shan DO Entered By: Raymond [name] on 05/03/2021 10:01:21 -------------------------------------------------------------------------------- HxROS Details Patient Name: Date of Service: Raymond [name] 05/03/2021 8:45 A M Medical Record Number: [id number] Patient Account Number: [id number] Date of Birth/Sex: Treating RN: 01/08/1961 (60 y.o. Raymond [name] Primary Care Provider: Minette [name] Other Clinician: Referring Provider: Treating Provider/Extender: Raymond [name] in Treatment: 5 Information Obtained From Patient Constitutional Symptoms (General Health) Medical History: Past Medical History Notes: obesity Eyes Medical History: Positive for: Cataracts - bil removed Negative for: Glaucoma; Optic Neuritis Past Medical History Notes: diabetic retinopathy right eye Ear/Nose/Mouth/Throat Medical History: Negative for: Chronic sinus problems/congestion; Middle ear  problems Hematologic/Lymphatic Medical History: Negative for: Anemia; Hemophilia; Human Immunodeficiency Virus; Lymphedema; Sickle Cell Disease Respiratory Medical History: Negative for: Aspiration; Asthma; Chronic Obstructive Pulmonary Disease (COPD); Pneumothorax; Sleep Apnea; Tuberculosis Cardiovascular Medical History: Positive for: Hypertension; Peripheral Arterial Disease Negative for: Angina; Arrhythmia; Congestive Heart Failure; Coronary Artery Disease; Deep Vein Thrombosis; Hypotension; Myocardial Infarction; Peripheral Venous Disease; Phlebitis; Vasculitis Past Medical History Notes: hyperlipidemia Gastrointestinal Medical History: Negative for: Cirrhosis ; Colitis; Crohns; Hepatitis A; Hepatitis B; Hepatitis C Endocrine Medical History: Positive for: Type II Diabetes Negative for: Type I Diabetes Time with diabetes: 45 Treated with: Insulin Blood sugar tested every day: No Genitourinary Medical History: Negative for: End Stage Renal Disease Past Medical History Notes: hx kidney transplant Immunological Medical History: Negative for: Lupus Erythematosus; Raynauds; Scleroderma Integumentary (Skin) Medical History: Negative for: History of Burn Musculoskeletal Medical History: Positive for: Osteomyelitis Negative for: Gout; Rheumatoid Arthritis; Osteoarthritis Neurologic Medical History: Positive for: Neuropathy Negative for: Dementia; Quadriplegia; Paraplegia; Seizure Disorder Oncologic Medical History: Negative for: Received Chemotherapy; Received Radiation Psychiatric Medical History: Negative for: Anorexia/bulimia; Confinement Anxiety HBO Extended History Items Eyes: Cataracts Immunizations Pneumococcal Vaccine: Received Pneumococcal Vaccination: No Implantable Devices None Hospitalization / Surgery History Type of Hospitalization/Surgery bone biopsy kidney transplant penile pump insertion graft application Family and Social History Cancer:  Yes - Mother,Siblings; Diabetes: Yes - Mother,Siblings,Paternal Grandparents; Heart Disease: Yes - Paternal Grandparents; Hereditary Spherocytosis: No; Hypertension: Yes - Mother,Father,Siblings; Kidney Disease: Yes - Mother; Lung Disease: No; Seizures: No; Stroke: No; Thyroid Problems: No; Tuberculosis: No; Never smoker; Marital Status -  Married; Alcohol Use: Rarely; Drug Use: No History; Caffeine Use: Daily; Financial Concerns: No; Food, Clothing or Shelter Needs: No; Support System Lacking: No; Transportation Concerns: No Electronic Signature(s) Signed: 05/03/2021 10:25:22 AM By: Raymond Shan DO Signed: 05/03/2021 12:28:25 PM By: Baruch Gouty RN, BSN Entered By: Raymond [name] on 05/03/2021 09:52:49 -------------------------------------------------------------------------------- Total Contact Cast Details Patient Name: Date of Service: Raymond [name], [place] [name] 05/03/2021 8:45 A M Medical Record Number: [id number] Patient Account Number: [id number] Date of Birth/Sex: Treating RN: [birth year] (60 y.o. Raymond [name], Raymond [name] Primary Care Provider: Minette [name] Other Clinician: Referring Provider: Treating Provider/Extender: Raymond [name] in Treatment: 5 T Contact Cast Applied for Wound Assessment: otal Wound #2 Right Metatarsal head first Performed By: Physician Raymond Shan, DO Post Procedure Diagnosis Same as Pre-procedure Electronic Signature(s) Signed: 05/03/2021 10:25:22 AM By: Raymond Shan DO Signed: 05/03/2021 11:46:22 AM By: Rhae Hammock RN Entered By: Rhae Hammock on 05/03/2021 09:32:22 -------------------------------------------------------------------------------- SuperBill Details Patient Name: Date of Service: Raymond [name] 05/03/2021 Medical Record Number: [id number] Patient Account Number: [id number] Date of Birth/Sex: Treating RN: 1960-07-03 (60 y.o. 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 severity E11.40 Type 2 diabetes mellitus with diabetic neuropathy, unspecified Facility Procedures CPT4 Code: [id number] Description: [phone] - APPLY TOTAL CONTACT LEG CAST ICD-10 Diagnosis Description L97.518 Non-pressure chronic ulcer of other part of right foot with other specified sever E11.621 Type 2 diabetes mellitus with foot ulcer E11.40 Type 2 diabetes mellitus with  diabetic neuropathy, unspecified Modifier: ity Quantity: 1 Physician Procedures : CPT4 Code Description Modifier [id number] - WC PHYS APPLY TOTAL CONTACT CAST ICD-10 Diagnosis Description 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

## 2021-05-07 ENCOUNTER — Other Ambulatory Visit: Payer: Self-pay

## 2021-05-07 ENCOUNTER — Encounter (HOSPITAL_BASED_OUTPATIENT_CLINIC_OR_DEPARTMENT_OTHER): Payer: BC Managed Care – PPO | Admitting: Internal Medicine

## 2021-05-07 DIAGNOSIS — E11621 Type 2 diabetes mellitus with foot ulcer: Secondary | ICD-10-CM | POA: Diagnosis not present

## 2021-05-07 NOTE — Progress Notes (Signed)
Raymond [name], FECHER ([id number]) Visit Report for 05/07/2021 Arrival Information Details Patient Name: Date of Service: [place], [name] RO LD 05/07/2021 8:15 A M Medical Record Number: [id number] Patient Account Number: [id number] Date of Birth/Sex: Treating RN: [birth year] (60 y.o. [name] Primary Care Mikki Ziff: [name] Other Clinician: Referring Hubbert Landrigan: Treating Jantzen Pilger/Extender: Lynden Ang in Treatment: 6 Visit Information History Since Last Visit Added or deleted any medications: No Patient Arrived: Ambulatory Any new allergies or adverse reactions: No Arrival Time: 08:31 Had a fall or experienced change in No Accompanied By: self activities of daily living that may affect Transfer Assistance: None risk of falls: Patient Identification Verified: Yes Signs or symptoms of abuse/neglect since last visito No Secondary Verification Process Completed: Yes Hospitalized since last visit: No Patient Requires Transmission-Based Precautions: No Implantable device outside of the clinic excluding No Patient Has Alerts: No cellular tissue based products placed in the center since last visit: Has Dressing in Place as Prescribed: Yes Pain Present Now: No Electronic Signature(s) Signed: 05/07/2021 4:12:39 PM By: Sandre Kitty Entered By: Sandre Kitty on 05/07/2021 08:31:37 -------------------------------------------------------------------------------- Encounter Discharge Information Details Patient Name: Date of Service: Raymond [name], [name] 05/07/2021 8:15 A M Medical Record Number: [id number] Patient Account Number: [id number] Date of Birth/Sex: Treating RN: Feb 01, 1961 (60 y.o. [name] Primary Care Khamia Stambaugh: [name] Other Clinician: Referring Endrit Gittins: Treating Ketan Renz/Extender: Lynden Ang in Treatment: 6 Encounter Discharge Information Items Post Procedure Vitals Discharge Condition:  Stable Temperature (F): 98.3 Ambulatory Status: Ambulatory Pulse (bpm): 66 Discharge Destination: Home Respiratory Rate (breaths/min): 18 Transportation: Private Auto Blood Pressure (mmHg): 169/97 Accompanied By: alone Schedule Follow-up Appointment: Yes Clinical Summary of Care: Patient Declined Electronic Signature(s) Signed: 05/07/2021 4:51:20 PM By: Levan Hurst RN, BSN Entered By: Levan Hurst on 05/07/2021 09:35:32 -------------------------------------------------------------------------------- Lower Extremity Assessment Details Patient Name: Date of Service: [name], [name] 05/07/2021 8:15 A M Medical Record Number: [id number] Patient Account Number: [id number] Date of Birth/Sex: Treating RN: [birth year] (60 y.o. [name] Primary Care Elease Swarm: [name] Other Clinician: Referring Reizel Calzada: Treating Jaimee Corum/Extender: Lynden Ang in Treatment: 6 Edema Assessment Assessed: Shirlyn Goltz: No] Patrice Paradise: No] Edema: [Left: Ye] [Right: s] Calf Left: Right: Point of Measurement: 35 cm From Medial Instep 38 cm Ankle Left: Right: Point of Measurement: 12 cm From Medial Instep 23 cm Vascular Assessment Pulses: Dorsalis Pedis Palpable: [Right:Yes] Electronic Signature(s) Signed: 05/07/2021 4:51:20 PM By: Levan Hurst RN, BSN Entered By: Levan Hurst on 05/07/2021 08:51:46 -------------------------------------------------------------------------------- Multi Wound Chart Details Patient Name: Date of Service: Raymond [name], [name] 05/07/2021 8:15 A M Medical Record Number: [id number] Patient Account Number: [id number] Date of Birth/Sex: Treating RN: [birth year] (60 y.o. [name] Primary Care Avantae Bither: [name] Other Clinician: Referring Gilberta Peeters: Treating Syvilla Martin/Extender: Lynden Ang in Treatment: 6 Vital Signs Height(in): 70 Pulse(bpm): 46 Weight(lbs): 277 Blood Pressure(mmHg):  169/97 Body Mass Index(BMI): 34 Temperature(F): 98.3 Respiratory Rate(breaths/min): 18 Photos: [2:No Photos Right Metatarsal head first] [N/A:N/A N/A] Wound Location: [2:Blister] [N/A:N/A] Wounding Event: [2:Diabetic Wound/Ulcer of the Lower] [N/A:N/A] Primary Etiology: [2:Extremity Cataracts, Hypertension, Peripheral] [N/A:N/A] Comorbid History: [2:Arterial Disease, Type II Diabetes, Osteomyelitis, Neuropathy 05/27/2019] [N/A:N/A] Date Acquired: [2:6] [N/A:N/A] Weeks of Treatment: [2:Open] [N/A:N/A] Wound Status: [2:1.5x1.2x0.3] [N/A:N/A] Measurements L x W x D (cm) [2:1.414] [N/A:N/A] A (cm) : rea [2:0.424] [N/A:N/A] Volume (cm) : [2:60.90%] [N/A:N/A] % Reduction in A rea: [2:80.40%] [N/A:N/A] % Reduction in Volume: [2:Grade 2] [N/A:N/A] Classification: [2:Medium] [N/A:N/A] Exudate A  mount: [2:Serosanguineous] [N/A:N/A] Exudate Type: [2:red, brown] [N/A:N/A] Exudate Color: [2:Well defined, not attached] [N/A:N/A] Wound Margin: [2:Large (67-100%)] [N/A:N/A] Granulation A mount: [2:Red, Pink] [N/A:N/A] Granulation Quality: [2:None Present (0%)] [N/A:N/A] Necrotic A mount: [2:Fat Layer (Subcutaneous Tissue): Yes N/A] Exposed Structures: [2:Fascia: No Tendon: No Muscle: No Joint: No Bone: No Small (1-33%)] [N/A:N/A] Epithelialization: [2:Debridement - Excisional] [N/A:N/A] Debridement: Pre-procedure Verification/Time Out 08:57 [N/A:N/A] Taken: [2:Subcutaneous] [N/A:N/A] Tissue Debrided: [2:Skin/Subcutaneous Tissue] [N/A:N/A] Level: [2:1.8] [N/A:N/A] Debridement A (sq cm): [2:rea Curette] [N/A:N/A] Instrument: [2:Moderate] [N/A:N/A] Bleeding: [2:Silver Nitrate] [N/A:N/A] Hemostasis A chieved: [2:0] [N/A:N/A] Procedural Pain: [2:0] [N/A:N/A] Post Procedural Pain: [2:Procedure was tolerated well] [N/A:N/A] Debridement Treatment Response: [2:1.5x1.2x0.3] [N/A:N/A] Post Debridement Measurements L x W x D (cm) [2:0.424] [N/A:N/A] Post Debridement Volume: (cm) [2:Debridement]  [N/A:N/A] Procedures Performed: [2:T Contact Cast otal] Treatment Notes Electronic Signature(s) Signed: 05/07/2021 4:30:32 PM By: Linton Ham MD Signed: 05/07/2021 4:51:20 PM By: Levan Hurst RN, BSN Entered By: Linton Ham on 05/07/2021 09:05:08 -------------------------------------------------------------------------------- Multi-Disciplinary Care Plan Details Patient Name: Date of Service: [employer] MB, [name] 05/07/2021 8:15 A M Medical Record Number: [id number] Patient Account Number: [id number] Date of Birth/Sex: Treating RN: Oct 08, 1960 (60 y.o. [name] Primary Care Marjon Doxtater: [name] Other Clinician: Referring Mikya Don: Treating Bryen Hinderman/Extender: Lynden Ang in Treatment: [place] reviewed with physician Active Inactive Nutrition Nursing Diagnoses: Impaired glucose control: actual or potential Potential for alteratiion in Nutrition/Potential for imbalanced nutrition Goals: Patient/caregiver verbalizes understanding of need to maintain therapeutic glucose control per primary care physician Date Initiated: 03/26/2021 Date Inactivated: 04/23/2021 Target Resolution Date: 04/23/2021 Goal Status: Met Patient/caregiver will maintain therapeutic glucose control Date Initiated: 03/26/2021 Target Resolution Date: 05/24/2021 Goal Status: Active Interventions: Assess HgA1c results as ordered upon admission and as needed Provide education on elevated blood sugars and impact on wound healing Treatment Activities: Dietary management education, guidance and counseling : 03/26/2021 Special diet education : 03/26/2021 Notes: Wound/Skin Impairment Nursing Diagnoses: Impaired tissue integrity Knowledge deficit related to ulceration/compromised skin integrity Goals: Patient/caregiver will verbalize understanding of skin care regimen Date Initiated: 03/26/2021 Target Resolution Date: 05/24/2021 Goal Status:  Active Ulcer/skin breakdown will have a volume reduction of 30% by week 4 Date Initiated: 03/26/2021 Date Inactivated: 04/23/2021 Target Resolution Date: 04/23/2021 Goal Status: Met Interventions: Assess patient/caregiver ability to obtain necessary supplies Assess patient/caregiver ability to perform ulcer/skin care regimen upon admission and as needed Assess ulceration(s) every visit Provide education on ulcer and skin care Treatment Activities: Skin care regimen initiated : 03/26/2021 Topical wound management initiated : 03/26/2021 Notes: Electronic Signature(s) Signed: 05/07/2021 4:51:20 PM By: Levan Hurst RN, BSN Entered By: Levan Hurst on 05/07/2021 08:52:34 -------------------------------------------------------------------------------- Pain Assessment Details Patient Name: Date of Service: Raymond [name], [name] 05/07/2021 8:15 A M Medical Record Number: [id number] Patient Account Number: [id number] Date of Birth/Sex: Treating RN: [birth year] (60 y.o. [name] Primary Care Amye Grego: [name] Other Clinician: Referring Gevorg Brum: Treating Dequincy Born/Extender: Lynden Ang in Treatment: 6 Active Problems Location of Pain Severity and Description of Pain Patient Has Paino No Site Locations Pain Management and Medication Current Pain Management: Electronic Signature(s) Signed: 05/07/2021 4:12:39 PM By: Sandre Kitty Signed: 05/07/2021 4:51:20 PM By: Levan Hurst RN, BSN Entered By: Sandre Kitty on 05/07/2021 08:32:03 -------------------------------------------------------------------------------- Patient/Caregiver Education Details Patient Name: Date of Service: Raymond [name], [name] 12/13/2022andnbsp8:15 A M Medical Record Number: [id number] Patient Account Number: [id number] Date of Birth/Gender: Treating RN: 1960-10-18 (60 y.o. [name] Primary Care Physician: [name] Other  Clinician: Referring  Physician: Treating Physician/Extender: Lynden Ang in Treatment: 6 Education Assessment Education Provided To: Patient Education Topics Provided Wound/Skin Impairment: Methods: Explain/Verbal Responses: State content correctly Electronic Signature(s) Signed: 05/07/2021 4:51:20 PM By: Levan Hurst RN, BSN Entered By: Levan Hurst on 05/07/2021 08:52:54 -------------------------------------------------------------------------------- Wound Assessment Details Patient Name: Date of Service: [name], [name] 05/07/2021 8:15 A M Medical Record Number: [id number] Patient Account Number: [id number] Date of Birth/Sex: Treating RN: 1961/05/17 (60 y.o. [name] Primary Care Jesua Tamblyn: [name] Other Clinician: Referring Fusako Tanabe: Treating Celine Dishman/Extender: Lynden Ang in Treatment: 6 Wound Status Wound Number: 2 Primary Diabetic Wound/Ulcer of the Lower Extremity Etiology: Wound Location: Right Metatarsal head first Wound Open Wounding Event: Blister Status: Date Acquired: 05/27/2019 Comorbid Cataracts, Hypertension, Peripheral Arterial Disease, Type II Weeks Of Treatment: 6 History: Diabetes, Osteomyelitis, Neuropathy Clustered Wound: No Wound Measurements Length: (cm) 1.5 Width: (cm) 1.2 Depth: (cm) 0.3 Area: (cm) 1.414 Volume: (cm) 0.424 % Reduction in Area: 60.9% % Reduction in Volume: 80.4% Epithelialization: Small (1-33%) Tunneling: No Undermining: No Wound Description Classification: Grade 2 Wound Margin: Well defined, not attached Exudate Amount: Medium Exudate Type: Serosanguineous Exudate Color: red, brown Foul Odor After Cleansing: No Slough/Fibrino No Wound Bed Granulation Amount: Large (67-100%) Exposed Structure Granulation Quality: Red, Pink Fascia Exposed: No Necrotic Amount: None Present (0%) Fat Layer (Subcutaneous Tissue) Exposed: Yes Tendon Exposed: No Muscle Exposed:  No Joint Exposed: No Bone Exposed: No Treatment Notes Wound #2 (Metatarsal head first) Wound Laterality: Right Cleanser Soap and Water Discharge Instruction: May shower and wash wound with dial antibacterial soap and water prior to dressing change. Wound Cleanser Discharge Instruction: Cleanse the wound with wound cleanser prior to applying a clean dressing using gauze sponges, not tissue or cotton balls. Peri-Wound Care Sween Lotion (Moisturizing lotion) Discharge Instruction: Apply moisturizing lotion to foot with dressing changes Topical Primary Dressing Hydrofera Blue Classic Foam, 2x2 in Discharge Instruction: Moisten with saline prior to applying to wound bed Secondary Dressing Woven Gauze Sponge, Non-Sterile 4x4 in Discharge Instruction: Apply over primary dressing as directed. Secured With [employer] Surgical T 2x10 (in/yd) ape Discharge Instruction: Secure with tape as directed. Compression Wrap Compression Stockings Add-Ons Electronic Signature(s) Signed: 05/07/2021 4:51:20 PM By: Levan Hurst RN, BSN Entered By: Levan Hurst on 05/07/2021 08:51:59 -------------------------------------------------------------------------------- Vitals Details Patient Name: Date of Service: Raymond [name], [name] 05/07/2021 8:15 A M Medical Record Number: [id number] Patient Account Number: [id number] Date of Birth/Sex: Treating RN: April 10, 1961 (60 y.o. [name] Primary Care Elija Mccamish: [name] Other Clinician: Referring Tezra Mahr: Treating Singleton Hickox/Extender: Lynden Ang in Treatment: 6 Vital Signs Time Taken: 08:31 Temperature (F): 98.3 Height (in): 68 Pulse (bpm): 66 Weight (lbs): 224 Respiratory Rate (breaths/min): 18 Body Mass Index (BMI): 34.1 Blood Pressure (mmHg): 169/97 Reference Range: 80 - 120 mg / dl Electronic Signature(s) Signed: 05/07/2021 4:12:39 PM By: Sandre Kitty Entered By: Sandre Kitty on  05/07/2021 08:31:57

## 2021-05-07 NOTE — Progress Notes (Signed)
ISADORE, PALECEK ([id number]) Visit Report for 05/07/2021 Debridement Details Patient Name: Date of Service: [facility], [name] RO LD 05/07/2021 8:15 A M Medical Record Number: [id number] Patient Account Number: [id number] Date of Birth/Sex: Treating RN: 01-27-1961 (60 y.o. [name] Primary Care Provider: [name] Other Clinician: Referring Provider: Treating Provider/Extender: Lynden Ang in Treatment: 6 Debridement Performed for Assessment: Wound #2 Right Metatarsal head first Performed By: Physician Ricard Dillon., MD Debridement Type: Debridement Severity of Tissue Pre Debridement: Fat layer exposed Level of Consciousness (Pre-procedure): Awake and Alert Pre-procedure Verification/Time Out Yes - 08:57 Taken: Start Time: 08:57 T Area Debrided (L x W): otal 1.5 (cm) x 1.2 (cm) = 1.8 (cm) Tissue and other material debrided: Viable, Non-Viable, Subcutaneous, Biofilm Level: Skin/Subcutaneous Tissue Debridement Description: Excisional Instrument: Curette Bleeding: Moderate Hemostasis Achieved: Silver Nitrate End Time: 08:58 Procedural Pain: 0 Post Procedural Pain: 0 Response to Treatment: Procedure was tolerated well Level of Consciousness (Post- Awake and Alert procedure): Post Debridement Measurements of Total Wound Length: (cm) 1.5 Width: (cm) 1.2 Depth: (cm) 0.3 Volume: (cm) 0.424 Character of Wound/Ulcer Post Debridement: Improved Severity of Tissue Post Debridement: Fat layer exposed Post Procedure Diagnosis Same as Pre-procedure Electronic Signature(s) Signed: 05/07/2021 4:30:32 PM By: Linton Ham MD Signed: 05/07/2021 4:51:20 PM By: Levan Hurst RN, BSN Entered By: Linton Ham on 05/07/2021 09:05:25 -------------------------------------------------------------------------------- HPI Details Patient Name: Date of Service: Raymond [name], [name] 05/07/2021 8:15 A M Medical Record Number: [id number] Patient Account  Number: [id number] Date of Birth/Sex: Treating RN: 1961/02/28 (60 y.o. [name] Primary Care Provider: [name] Other Clinician: Referring Provider: Treating Provider/Extender: Lynden Ang in Treatment: 6 History of Present Illness HPI Description: Admission 03/26/2021 Mr. Cheikh Bramble is a 60 year old male with a past medical history of uncontrolled type 2 diabetes on insulin with latest hemoglobin A1c of 15, previous left foot wound with osteomyelitis, peripheral arterial disease, status post kidney transplant, that presents to the clinic for a 1.5-year history of nonhealing wound to the plantar aspect of his right foot. He has been following with Dr. Earleen Newport, podiatry for this issue. He had surgical debridement with bone biopsy on 03/06/2021. Biopsy did not show evidence for osteomyelitis. At that time ACell was placed. He also follows with Dr. Megan Salon for antibiotic therapy and is currently on linezolid and Augmentin. He currently denies systemic signs of infection. He has been using Vaseline to the wound bed. He currently uses a front offloading shoe. He is following Dr. Loanne Drilling for his diabetes. 11/29; patient has not been here in 4 weeks. 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 He was given silver alginate and a surgical shoe and the wound is actually come in by 1 cm in width which is quite a big improvement. He has been evaluated by Dr. Gwenlyn Found because of a history of PAD. Last seen on 04/03/2021. Repeat noninvasive studies were done on 04/12/2021 showing a noncompressible ABI on the right but a TBI of 0.95 but monophasic waveforms. On the left also noncompressible with monophasic waveforms but with a TBI of 0.68. The normal TBI's bilaterally suggest adequate blood flow 12/13; right plantar first metatarsal head diabetic ulcer. He has been treated for osteomyelitis by infectious disease. He has been evaluated by Dr. Gwenlyn Found.  We have been using silver alginate under a total contact cast for the last week 12/6; the wound was dramatically better last week. Slightly smaller this week but unfortunately the tissue  was nonadherent. He has been using silver alginate with a surgical shoe 12/9; patient presents for first cast change. Has no issues or complaints today. Electronic Signature(s) Signed: 05/07/2021 4:30:32 PM By: Linton Ham MD Entered By: Linton Ham on 05/07/2021 09:08:06 -------------------------------------------------------------------------------- Physical Exam Details Patient Name: Date of Service: Raymond [name], [name] 05/07/2021 8:15 A M Medical Record Number: [id number] Patient Account Number: [id number] Date of Birth/Sex: Treating RN: March 12, 1961 (60 y.o. [name] Primary Care Provider: [name] Other Clinician: Referring Provider: Treating Provider/Extender: Lynden Ang in Treatment: 6 Constitutional Patient is hypertensive.. Pulse regular and within target range for patient.[name] Respirations regular, non-labored and within target range.. Temperature is normal and within the target range for the patient.[name] Appears in no distress. Notes Wound exam; right foot first metatarsal head. No improvement in surface area. Nonviable surface under illumination. I used a #5 curette to remove this. Surprisingly difficult to remove subcutaneous debridement hemostasis with silver nitrate and a pressure dressing. Electronic Signature(s) Signed: 05/07/2021 4:30:32 PM By: Linton Ham MD Entered By: Linton Ham on 05/07/2021 09:09:05 -------------------------------------------------------------------------------- Physician Orders Details Patient Name: Date of Service: Raymond [name], [name] 05/07/2021 8:15 A M Medical Record Number: [id number] Patient Account Number: [id number] Date of Birth/Sex: Treating RN: 1961-03-24 (60 y.o. [name] Primary Care  Provider: [name] Other Clinician: Referring Provider: Treating Provider/Extender: Lynden Ang in Treatment: 6 Verbal / Phone Orders: No Diagnosis Coding ICD-10 Coding Code Description E11.621 Type 2 diabetes mellitus with foot ulcer L97.518 Non-pressure chronic ulcer of other part of right foot with other specified severity E11.40 Type 2 diabetes mellitus with diabetic neuropathy, unspecified Follow-up Appointments ppointment in 1 week. - Dr. Dellia Nims - ***60 minutes for cast*** Return A Bathing/ Shower/ Hygiene May shower and wash wound with soap and water. - with dressing changes Off-Loading Wound #2 Right Metatarsal head first Total Contact Cast to Right Lower Extremity Wound Treatment Wound #2 - Metatarsal head first Wound Laterality: Right Cleanser: Soap and Water 1 x Per Day/30 Days Discharge Instructions: May shower and wash wound with dial antibacterial soap and water prior to dressing change. Cleanser: Wound Cleanser 1 x Per Day/30 Days Discharge Instructions: Cleanse the wound with wound cleanser prior to applying a clean dressing using gauze sponges, not tissue or cotton balls. Peri-Wound Care: Sween Lotion (Moisturizing lotion) 1 x Per Day/30 Days Discharge Instructions: Apply moisturizing lotion to foot with dressing changes Prim Dressing: Hydrofera Blue Classic Foam, 2x2 in 1 x Per Day/30 Days ary Discharge Instructions: Moisten with saline prior to applying to wound bed Secondary Dressing: Woven Gauze Sponge, Non-Sterile 4x4 in (Generic) 1 x Per Day/30 Days Discharge Instructions: Apply over primary dressing as directed. Secured With: 52M Medipore Public affairs consultant Surgical T 2x10 (in/yd) 1 x Per Day/30 Days ape Discharge Instructions: Secure with tape as directed. Electronic Signature(s) Signed: 05/07/2021 4:30:32 PM By: Linton Ham MD Signed: 05/07/2021 4:51:20 PM By: Levan Hurst RN, BSN Entered By: Levan Hurst on 05/07/2021  09:01:23 -------------------------------------------------------------------------------- Problem List Details Patient Name: Date of Service: Raymond [name], [name] 05/07/2021 8:15 A M Medical Record Number: [id number] Patient Account Number: [id number] Date of Birth/Sex: Treating RN: 1960/11/16 (60 y.o. [name] Primary Care Provider: [name] Other Clinician: Referring Provider: Treating Provider/Extender: Lynden Ang in Treatment: 6 Active Problems ICD-10 Encounter Code Description Active Date MDM Diagnosis E11.621 Type 2 diabetes mellitus with foot ulcer 03/26/2021 No Yes L97.518 Non-pressure chronic ulcer of  other part of right foot with other specified 04/23/2021 No Yes severity E11.40 Type 2 diabetes mellitus with diabetic neuropathy, unspecified 03/26/2021 No Yes Inactive Problems Resolved Problems Electronic Signature(s) Signed: 05/07/2021 4:30:32 PM By: Linton Ham MD Entered By: Linton Ham on 05/07/2021 09:04:59 -------------------------------------------------------------------------------- Progress Note Details Patient Name: Date of Service: Raymond [name], [name] 05/07/2021 8:15 A M Medical Record Number: [id number] Patient Account Number: [id number] Date of Birth/Sex: Treating RN: 1960-12-08 (60 y.o. [name] Primary Care Provider: [name] Other Clinician: Referring Provider: Treating Provider/Extender: Lynden Ang in Treatment: 6 Subjective History of Present Illness (HPI) Admission 03/26/2021 Mr. Leonides Minder is a 60 year old male with a past medical history of uncontrolled type 2 diabetes on insulin with latest hemoglobin A1c of 15, previous left foot wound with osteomyelitis, peripheral arterial disease, status post kidney transplant, that presents to the clinic for a 1.5-year history of nonhealing wound to the plantar aspect of his right foot. He has been following with Dr.  Earleen Newport, podiatry for this issue. He had surgical debridement with bone biopsy on 03/06/2021. Biopsy did not show evidence for osteomyelitis. At that time ACell was placed. He also follows with Dr. Megan Salon for antibiotic therapy and is currently on linezolid and Augmentin. He currently denies systemic signs of infection. He has been using Vaseline to the wound bed. He currently uses a front offloading shoe. He is following Dr. Loanne Drilling for his diabetes. 11/29; patient has not been here in 4 weeks. 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 He was given silver alginate and a surgical shoe and the wound is actually come in by 1 cm in width which is quite a big improvement. He has been evaluated by Dr. Gwenlyn Found because of a history of PAD. Last seen on 04/03/2021. Repeat noninvasive studies were done on 04/12/2021 showing a noncompressible ABI on the right but a TBI of 0.95 but monophasic waveforms. On the left also noncompressible with monophasic waveforms but with a TBI of 0.68. The normal TBI's bilaterally suggest adequate blood flow 12/13; right plantar first metatarsal head diabetic ulcer. He has been treated for osteomyelitis by infectious disease. He has been evaluated by Dr. Gwenlyn Found. We have been using silver alginate under a total contact cast for the last week 12/6; the wound was dramatically better last week. Slightly smaller this week but unfortunately the tissue was nonadherent. He has been using silver alginate with a surgical shoe 12/9; patient presents for first cast change. Has no issues or complaints today. Objective Constitutional Patient is hypertensive.. Pulse regular and within target range for patient.[name] Respirations regular, non-labored and within target range.. Temperature is normal and within the target range for the patient.[name] Appears in no distress. Vitals Time Taken: 8:31 AM, Height: 68 in, Weight: 224 lbs, BMI: 34.1, Temperature: 98.3 F, Pulse: 66 bpm,  Respiratory Rate: 18 breaths/min, Blood Pressure: 169/97 mmHg. General Notes: Wound exam; right foot first metatarsal head. No improvement in surface area. Nonviable surface under illumination. I used a #5 curette to remove this. Surprisingly difficult to remove subcutaneous debridement hemostasis with silver nitrate and a pressure dressing. Integumentary (Hair, Skin) Wound #2 status is Open. Original cause of wound was Blister. The date acquired was: 05/27/2019. The wound has been in treatment 6 weeks. The wound is located on the Right Metatarsal head first. The wound measures 1.5cm length x 1.2cm width x 0.3cm depth; 1.414cm^2 area and 0.424cm^3 volume. There is Fat Layer (Subcutaneous Tissue) exposed.  There is no tunneling or undermining noted. There is a medium amount of serosanguineous drainage noted. The wound margin is well defined and not attached to the wound base. There is large (67-100%) red, pink granulation within the wound bed. There is no necrotic tissue within the wound bed. Assessment Active Problems ICD-10 Type 2 diabetes mellitus with foot ulcer Non-pressure chronic ulcer of other part of right foot with other specified severity Type 2 diabetes mellitus with diabetic neuropathy, unspecified Procedures Wound #2 Pre-procedure diagnosis of Wound #2 is a Diabetic Wound/Ulcer of the Lower Extremity located on the Right Metatarsal head first .Severity of Tissue Pre Debridement is: Fat layer exposed. There was a Excisional Skin/Subcutaneous Tissue Debridement with a total area of 1.8 sq cm performed by Ricard Dillon., MD. With the following instrument(s): Curette to remove Viable and Non-Viable tissue/material. Material removed includes Subcutaneous Tissue and Biofilm and. No specimens were taken. A time out was conducted at 08:57, prior to the start of the procedure. A Moderate amount of bleeding was controlled with Silver Nitrate. The procedure was tolerated well with a pain  level of 0 throughout and a pain level of 0 following the procedure. Post Debridement Measurements: 1.5cm length x 1.2cm width x 0.3cm depth; 0.424cm^3 volume. Character of Wound/Ulcer Post Debridement is improved. Severity of Tissue Post Debridement is: Fat layer exposed. Post procedure Diagnosis Wound #2: Same as Pre-Procedure Pre-procedure diagnosis of Wound #2 is a Diabetic Wound/Ulcer of the Lower Extremity located on the Right Metatarsal head first . There was a T Contact otal Cast Procedure by Ricard Dillon., MD. Post procedure Diagnosis Wound #2: Same as Pre-Procedure Plan Follow-up Appointments: Return Appointment in 1 week. - Dr. Dellia Nims - ***60 minutes for cast*** Bathing/ Shower/ Hygiene: May shower and wash wound with soap and water. - with dressing changes Off-Loading: Wound #2 Right Metatarsal head first: T Contact Cast to Right Lower Extremity otal WOUND #2: - Metatarsal head first Wound Laterality: Right Cleanser: Soap and Water 1 x Per Day/30 Days Discharge Instructions: May shower and wash wound with dial antibacterial soap and water prior to dressing change. Cleanser: Wound Cleanser 1 x Per Day/30 Days Discharge Instructions: Cleanse the wound with wound cleanser prior to applying a clean dressing using gauze sponges, not tissue or cotton balls. Peri-Wound Care: Sween Lotion (Moisturizing lotion) 1 x Per Day/30 Days Discharge Instructions: Apply moisturizing lotion to foot with dressing changes Prim Dressing: Hydrofera Blue Classic Foam, 2x2 in 1 x Per Day/30 Days ary Discharge Instructions: Moisten with saline prior to applying to wound bed Secondary Dressing: Woven Gauze Sponge, Non-Sterile 4x4 in (Generic) 1 x Per Day/30 Days Discharge Instructions: Apply over primary dressing as directed. Secured With: 61M Medipore Public affairs consultant Surgical T 2x10 (in/yd) 1 x Per Day/30 Days ape Discharge Instructions: Secure with tape as directed. 1. I change the primary dressing  to Hydrofera Blue 2. Again under a total contact cast 3. I am hopeful that an aggressive debridement today will allow some epithelialization 4. He has been treated for osteomyelitis 5. Very poor diabetic control but he apparently is working on this with other Armed forces training and education officer) Signed: 05/07/2021 4:30:32 PM By: Linton Ham MD Entered By: Linton Ham on 05/07/2021 09:10:00 -------------------------------------------------------------------------------- Total Contact Cast Details Patient Name: Date of Service: Raymond [name], [name] 05/07/2021 8:15 A M Medical Record Number: [id number] Patient Account Number: [id number] Date of Birth/Sex: Treating RN: 12-Jan-1961 (60 y.o. [name] Primary Care Provider: [name] Other Clinician: Referring  Provider: Treating Provider/Extender: Lynden Ang in Treatment: 6 T Contact Cast Applied for Wound Assessment: otal Wound #2 Right Metatarsal head first Performed By: Physician Ricard Dillon., MD Post Procedure Diagnosis Same as Pre-procedure Electronic Signature(s) Signed: 05/07/2021 4:30:32 PM By: Linton Ham MD Entered By: Linton Ham on 05/07/2021 09:05:37 -------------------------------------------------------------------------------- SuperBill Details Patient Name: Date of Service: Raymond [name], [name] 05/07/2021 Medical Record Number: [id number] Patient Account Number: [id number] Date of Birth/Sex: Treating RN: 08-Sep-1960 (60 y.o. [name] Primary Care Provider: [name] Other Clinician: Referring Provider: Treating Provider/Extender: Lynden Ang in Treatment: 6 Diagnosis Coding ICD-10 Codes Code Description [id number] Type 2 diabetes mellitus with foot ulcer L97.518 Non-pressure chronic ulcer of other part of right foot with other specified severity E11.40 Type 2 diabetes mellitus with diabetic neuropathy, unspecified Facility  Procedures CPT4 Code: [id number] Description: 93734 - DEB SUBQ TISSUE 20 SQ CM/< ICD-10 Diagnosis Description L97.518 Non-pressure chronic ulcer of other part of right foot with other specified sev E11.621 Type 2 diabetes mellitus with foot ulcer Modifier: erity Quantity: 1 Physician Procedures : CPT4 Code Description Modifier [id number] - WC PHYS SUBQ TISS 20 SQ CM ICD-10 Diagnosis Description L97.518 Non-pressure chronic ulcer of other part of right foot with other specified severity E11.621 Type 2 diabetes mellitus with foot ulcer Quantity: 1 Electronic Signature(s) Signed: 05/07/2021 4:30:32 PM By: Linton Ham MD Entered By: Linton Ham on 05/07/2021 09:10:15

## 2021-05-14 ENCOUNTER — Other Ambulatory Visit: Payer: Self-pay

## 2021-05-14 ENCOUNTER — Encounter (HOSPITAL_BASED_OUTPATIENT_CLINIC_OR_DEPARTMENT_OTHER): Payer: BC Managed Care – PPO | Admitting: Internal Medicine

## 2021-05-14 DIAGNOSIS — E11621 Type 2 diabetes mellitus with foot ulcer: Secondary | ICD-10-CM | POA: Diagnosis not present

## 2021-05-14 NOTE — Progress Notes (Signed)
JURIS, GOSNELL ([id number]) Visit Report for 05/14/2021 Arrival Information Details Patient Name: Date of Service: [place], [name] RO LD 05/14/2021 8:45 A M Medical Record Number: [id number] Patient Account Number: [id number] Date of Birth/Sex: Treating RN: 1960/11/29 (60 y.o. [name] Primary Care Inger Wiest: [name] Other Clinician: Referring Christiane Sistare: Treating Zein Helbing/Extender: Lynden Ang in Treatment: 7 Visit Information History Since Last Visit Added or deleted any medications: No Patient Arrived: Ambulatory Any new allergies or adverse reactions: No Arrival Time: 09:01 Had a fall or experienced change in No Accompanied By: alone activities of daily living that may affect Transfer Assistance: None risk of falls: Patient Identification Verified: Yes Signs or symptoms of abuse/neglect since last visito No Secondary Verification Process Completed: Yes Hospitalized since last visit: No Patient Requires Transmission-Based Precautions: No Implantable device outside of the clinic excluding No Patient Has Alerts: No cellular tissue based products placed in the center since last visit: Has Dressing in Place as Prescribed: Yes Has Footwear/Offloading in Place as Prescribed: Yes Right: T Contact Cast otal Pain Present Now: No Electronic Signature(s) Signed: 05/14/2021 5:35:33 PM By: Levan Hurst RN, BSN Entered By: Levan Hurst on 05/14/2021 09:02:09 -------------------------------------------------------------------------------- Encounter Discharge Information Details Patient Name: Date of Service: [name], [name] 05/14/2021 8:45 A M Medical Record Number: [id number] Patient Account Number: [id number] Date of Birth/Sex: Treating RN: 05/16/1961 (60 y.o. [name] Primary Care Caitlyne Ingham: [name] Other Clinician: Referring Ladislav Caselli: Treating Isobella Ascher/Extender: Lynden Ang in Treatment:  7 Encounter Discharge Information Items Discharge Condition: Stable Ambulatory Status: Ambulatory Discharge Destination: Home Transportation: Private Auto Accompanied By: alone Schedule Follow-up Appointment: Yes Clinical Summary of Care: Patient Declined Electronic Signature(s) Signed: 05/14/2021 5:35:33 PM By: Levan Hurst RN, BSN Entered By: Levan Hurst on 05/14/2021 16:53:51 -------------------------------------------------------------------------------- Lower Extremity Assessment Details Patient Name: Date of Service: [name], [name] 05/14/2021 8:45 A M Medical Record Number: [id number] Patient Account Number: [id number] Date of Birth/Sex: Treating RN: December 26, 1960 (60 y.o. [name] Primary Care Salvator Seppala: [name] Other Clinician: Referring Tajh Livsey: Treating Lesa Vandall/Extender: Lynden Ang in Treatment: 7 Edema Assessment Assessed: Shirlyn Goltz: No] Patrice Paradise: No] Edema: [Left: Ye] [Right: s] Calf Left: Right: Point of Measurement: 35 cm From Medial Instep 38 cm Ankle Left: Right: Point of Measurement: 12 cm From Medial Instep 23 cm Vascular Assessment Pulses: Dorsalis Pedis Palpable: [Right:Yes] Electronic Signature(s) Signed: 05/14/2021 5:35:33 PM By: Levan Hurst RN, BSN Entered By: Levan Hurst on 05/14/2021 09:16:33 -------------------------------------------------------------------------------- Multi Wound Chart Details Patient Name: Date of Service: [name], [name] 05/14/2021 8:45 A M Medical Record Number: [id number] Patient Account Number: [id number] Date of Birth/Sex: Treating RN: October 28, 1960 (60 y.o. [name] Primary Care Shalev Helminiak: [name] Other Clinician: Referring Dedric Ethington: Treating Kealy Lewter/Extender: Lynden Ang in Treatment: 7 Vital Signs Height(in): 19 Capillary Blood Glucose(mg/dl): 200 Weight(lbs): 224 Pulse(bpm): 80 Body Mass Index(BMI): 34 Blood  Pressure(mmHg): 951/884 Temperature(F): 97.9 Respiratory Rate(breaths/min): 18 Photos: [2:Right Metatarsal head first] [N/A:N/A N/A] Wound Location: [2:Blister] [N/A:N/A] Wounding Event: [2:Diabetic Wound/Ulcer of the Lower] [N/A:N/A] Primary Etiology: [2:Extremity Cataracts, Hypertension, Peripheral N/A] Comorbid History: [2:Arterial Disease, Type II Diabetes, Osteomyelitis, Neuropathy 05/27/2019] [N/A:N/A] Date Acquired: [2:7] [N/A:N/A] Weeks of Treatment: [2:Open] [N/A:N/A] Wound Status: [2:1.5x0.9x0.2] [N/A:N/A] Measurements L x W x D (cm) [2:1.06] [N/A:N/A] A (cm) : rea [2:0.212] [N/A:N/A] Volume (cm) : [2:70.70%] [N/A:N/A] % Reduction in A rea: [2:90.20%] [N/A:N/A] % Reduction in Volume: [2:Grade 2] [N/A:N/A] Classification: [2:Medium] [N/A:N/A] Exudate A mount: [  2:Serosanguineous] [N/A:N/A] Exudate Type: [2:red, brown] [N/A:N/A] Exudate Color: [2:Well defined, not attached] [N/A:N/A] Wound Margin: [2:Large (67-100%)] [N/A:N/A] Granulation A mount: [2:Pink, Pale] [N/A:N/A] Granulation Quality: [2:Small (1-33%)] [N/A:N/A] Necrotic A mount: [2:Fat Layer (Subcutaneous Tissue): Yes N/A] Exposed Structures: [2:Fascia: No Tendon: No Muscle: No Joint: No Bone: No Small (1-33%)] [N/A:N/A] Epithelialization: [2:T Contact Cast otal] [N/A:N/A] Treatment Notes Electronic Signature(s) Signed: 05/14/2021 4:29:35 PM By: Linton Ham MD Signed: 05/14/2021 5:35:33 PM By: Levan Hurst RN, BSN Entered By: Linton Ham on 05/14/2021 09:24:04 -------------------------------------------------------------------------------- Multi-Disciplinary Care Plan Details Patient Name: Date of Service: [facility], [name] 05/14/2021 8:45 A M Medical Record Number: [id number] Patient Account Number: [id number] Date of Birth/Sex: Treating RN: April 06, 1961 (60 y.o. [name] Primary Care Philipp Callegari: [name] Other Clinician: Referring Adhira Jamil: Treating Malakye Nolden/Extender: Lynden Ang in Treatment: [place] reviewed with physician Active Inactive Nutrition Nursing Diagnoses: Impaired glucose control: actual or potential Potential for alteratiion in Nutrition/Potential for imbalanced nutrition Goals: Patient/caregiver verbalizes understanding of need to maintain therapeutic glucose control per primary care physician Date Initiated: 03/26/2021 Date Inactivated: 04/23/2021 Target Resolution Date: 04/23/2021 Goal Status: Met Patient/caregiver will maintain therapeutic glucose control Date Initiated: 03/26/2021 Target Resolution Date: 05/24/2021 Goal Status: Active Interventions: Assess HgA1c results as ordered upon admission and as needed Provide education on elevated blood sugars and impact on wound healing Treatment Activities: Dietary management education, guidance and counseling : 03/26/2021 Special diet education : 03/26/2021 Notes: Wound/Skin Impairment Nursing Diagnoses: Impaired tissue integrity Knowledge deficit related to ulceration/compromised skin integrity Goals: Patient/caregiver will verbalize understanding of skin care regimen Date Initiated: 03/26/2021 Target Resolution Date: 05/24/2021 Goal Status: Active Ulcer/skin breakdown will have a volume reduction of 30% by week 4 Date Initiated: 03/26/2021 Date Inactivated: 04/23/2021 Target Resolution Date: 04/23/2021 Goal Status: Met Interventions: Assess patient/caregiver ability to obtain necessary supplies Assess patient/caregiver ability to perform ulcer/skin care regimen upon admission and as needed Assess ulceration(s) every visit Provide education on ulcer and skin care Treatment Activities: Skin care regimen initiated : 03/26/2021 Topical wound management initiated : 03/26/2021 Notes: Electronic Signature(s) Signed: 05/14/2021 5:35:33 PM By: Levan Hurst RN, BSN Entered By: Levan Hurst on 05/14/2021  16:51:55 -------------------------------------------------------------------------------- Pain Assessment Details Patient Name: Date of Service: [name], [name] 05/14/2021 8:45 A M Medical Record Number: [id number] Patient Account Number: [id number] Date of Birth/Sex: Treating RN: 1960/11/26 (60 y.o. [name] Primary Care Shirle Provencal: [name] Other Clinician: Referring Wiley Flicker: Treating Dheeraj Hail/Extender: Lynden Ang in Treatment: 7 Active Problems Location of Pain Severity and Description of Pain Patient Has Paino No Site Locations Pain Management and Medication Current Pain Management: Electronic Signature(s) Signed: 05/14/2021 5:35:33 PM By: Levan Hurst RN, BSN Entered By: Levan Hurst on 05/14/2021 09:13:09 -------------------------------------------------------------------------------- Patient/Caregiver Education Details Patient Name: Date of Service: [name], [name] 12/20/2022andnbsp8:45 [place] Record Number: [id number] Patient Account Number: [id number] Date of Birth/Gender: Treating RN: [birth year] (60 y.o. [name] Primary Care Physician: [name] Other Clinician: Referring Physician: Treating Physician/Extender: Lynden Ang in Treatment: 7 Education Assessment Education Provided To: Patient Education Topics Provided Wound/Skin Impairment: Methods: Explain/Verbal Responses: State content correctly Electronic Signature(s) Signed: 05/14/2021 5:35:33 PM By: Levan Hurst RN, BSN Entered By: Levan Hurst on 05/14/2021 16:52:11 -------------------------------------------------------------------------------- Wound Assessment Details Patient Name: Date of Service: [name], [name] 05/14/2021 8:45 A M Medical Record Number: [id number] Patient Account Number: [id number] Date of Birth/Sex: Treating RN: January 08, 1961 (60 y.o. Jonette Eva,  [place] Primary Care Jameire Kouba: [name] Other Clinician: Referring Jetaime Pinnix: Treating Tulsi Crossett/Extender: Lynden Ang in Treatment: 7 Wound Status Wound Number: 2 Primary Diabetic Wound/Ulcer of the Lower Extremity Etiology: Wound Location: Right Metatarsal head first Wound Open Wounding Event: Blister Status: Date Acquired: 05/27/2019 Comorbid Cataracts, Hypertension, Peripheral Arterial Disease, Type II Weeks Of Treatment: 7 History: Diabetes, Osteomyelitis, Neuropathy Clustered Wound: No Photos Wound Measurements Length: (cm) 1.5 Width: (cm) 0.9 Depth: (cm) 0.2 Area: (cm) 1.06 Volume: (cm) 0.212 % Reduction in Area: 70.7% % Reduction in Volume: 90.2% Epithelialization: Small (1-33%) Tunneling: No Undermining: No Wound Description Classification: Grade 2 Wound Margin: Well defined, not attached Exudate Amount: Medium Exudate Type: Serosanguineous Exudate Color: red, brown Foul Odor After Cleansing: No Slough/Fibrino Yes Wound Bed Granulation Amount: Large (67-100%) Exposed Structure Granulation Quality: Pink, Pale Fascia Exposed: No Necrotic Amount: Small (1-33%) Fat Layer (Subcutaneous Tissue) Exposed: Yes Necrotic Quality: Adherent Slough Tendon Exposed: No Muscle Exposed: No Joint Exposed: No Bone Exposed: No Treatment Notes Wound #2 (Metatarsal head first) Wound Laterality: Right Cleanser Soap and Water Discharge Instruction: May shower and wash wound with dial antibacterial soap and water prior to dressing change. Wound Cleanser Discharge Instruction: Cleanse the wound with wound cleanser prior to applying a clean dressing using gauze sponges, not tissue or cotton balls. Peri-Wound Care Sween Lotion (Moisturizing lotion) Discharge Instruction: Apply moisturizing lotion to foot with dressing changes Topical Primary Dressing Hydrofera Blue Classic Foam, 2x2 in Discharge Instruction: Moisten with saline prior to applying to wound bed Secondary Dressing Woven  Gauze Sponge, Non-Sterile 4x4 in Discharge Instruction: Apply over primary dressing as directed. Secured With [employer] Surgical T 2x10 (in/yd) ape Discharge Instruction: Secure with tape as directed. Compression Wrap Compression Stockings Add-Ons Electronic Signature(s) Signed: 05/14/2021 5:35:33 PM By: Levan Hurst RN, BSN Entered By: Levan Hurst on 05/14/2021 09:15:45 -------------------------------------------------------------------------------- [place] Details Patient Name: Date of Service: [name], [name] 05/14/2021 8:45 A M Medical Record Number: [id number] Patient Account Number: [id number] Date of Birth/Sex: Treating RN: March 11, 1961 (60 y.o. [name] Primary Care Zhane Donlan: [name] Other Clinician: Referring Athol Bolds: Treating Rocky Gladden/Extender: Lynden Ang in Treatment: 7 Vital Signs Time Taken: 09:01 Temperature (F): 97.9 Height (in): 68 Pulse (bpm): 65 Weight (lbs): 224 Respiratory Rate (breaths/min): 18 Body Mass Index (BMI): 34.1 Blood Pressure (mmHg): 214/117 Capillary Blood Glucose (mg/dl): 200 Reference Range: 80 - 120 mg / dl Notes Dr. Dellia Nims notified of elevated BP, patient states he ran out of Clonidine patches. Electronic Signature(s) Signed: 05/14/2021 5:35:33 PM By: Levan Hurst RN, BSN Entered By: Levan Hurst on 05/14/2021 09:13:43

## 2021-05-14 NOTE — Progress Notes (Signed)
Raymond [name] ([id number]) Visit Report for 05/14/2021 HPI Details Patient Name: Date of Service: Raymond [name] RO LD 05/14/2021 8:45 A M Medical Record Number: [id number] Patient Account Number: [id number] Date of Birth/Sex: Treating RN: 1961-04-06 (60 y.o. Raymond [name] Primary Care Provider: [name] Other Clinician: Referring Provider: Treating Provider/Extender: Lynden Ang in Treatment: 7 History of Present Illness HPI Description: Admission 03/26/2021 [name] is a 60 year old male with a past medical history of uncontrolled type 2 diabetes on insulin with latest hemoglobin A1c of 15, previous left foot wound with osteomyelitis, peripheral arterial disease, status post kidney transplant, that presents to the clinic for a 1.5-year history of nonhealing wound to the plantar aspect of his right foot. He has been following with Dr. Earleen Newport, podiatry for this issue. He had surgical debridement with bone biopsy on 03/06/2021. Biopsy did not show evidence for osteomyelitis. At that time ACell was placed. He also follows with Dr. Megan Salon for antibiotic therapy and is currently on linezolid and Augmentin. He currently denies systemic signs of infection. He has been using Vaseline to the wound bed. He currently uses a front offloading shoe. He is following Dr. Loanne Drilling for his diabetes. 11/29; patient has not been here in 4 weeks. 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 He was given silver alginate and a surgical shoe and the wound is actually come in by 1 cm in width which is quite a big improvement. He has been evaluated by Dr. Gwenlyn Found because of a history of PAD. Last seen on 04/03/2021. Repeat noninvasive studies were done on 04/12/2021 showing a noncompressible ABI on the right but a TBI of 0.95 but monophasic waveforms. On the left also noncompressible with monophasic waveforms but with a TBI of 0.68. The normal TBI's  bilaterally suggest adequate blood flow 12/13; right plantar first metatarsal head diabetic ulcer. He has been treated for osteomyelitis by infectious disease. He has been evaluated by Dr. Gwenlyn Found. We have been using silver alginate under a total contact cast for the last week 12/6; the wound was dramatically better last week. Slightly smaller this week but unfortunately the tissue was nonadherent. He has been using silver alginate with a surgical shoe 12/9; patient presents for first cast change. Has no issues or complaints today. 12/20; patient's wound is measuring smaller on the right first metatarsal head. Looks quite good and has no depth. We are using Hydrofera Blue under a total contact cast The patient comes in with very high tension. States he ran out of his clonidine patch 2 days ago because the pharmacy did not have it Electronic Signature(s) Signed: 05/14/2021 4:29:35 PM By: Linton Ham MD Entered By: Linton Ham on 05/14/2021 09:25:30 -------------------------------------------------------------------------------- Physical Exam Details Patient Name: Date of Service: Raymond [name], [name] 05/14/2021 8:45 A M Medical Record Number: [id number] Patient Account Number: [id number] Date of Birth/Sex: Treating RN: 1960/07/30 (60 y.o. Raymond [name] Primary Care Provider: [name] Other Clinician: Referring Provider: Treating Provider/Extender: Lynden Ang in Treatment: 7 Constitutional Patient is hypertensive.. Pulse regular and within target range for patient.[name] Respirations regular, non-labored and within target range.. Temperature is normal and within the target range for the patient.[name] Appears in no distress. Notes Wound exam; right first metatarsal head. No depth slightly smaller. Granulation looks healthy no evidence of surrounding infection. No debridement is required we will reapply the total contact cast Electronic Signature(s) Signed:  05/14/2021 4:29:35 PM By: Linton Ham MD  Entered By: Linton Ham on 05/14/2021 26:71:24 -------------------------------------------------------------------------------- Physician Orders Details Patient Name: Date of Service: [name], [name] 05/14/2021 8:45 A M Medical Record Number: [id number] Patient Account Number: [id number] Date of Birth/Sex: Treating RN: 04-19-1961 (60 y.o. Raymond [name] Primary Care Provider: [name] Other Clinician: Referring Provider: Treating Provider/Extender: Lynden Ang in Treatment: 7 Verbal / Phone Orders: No Diagnosis Coding ICD-10 Coding Code Description E11.621 Type 2 diabetes mellitus with foot ulcer L97.518 Non-pressure chronic ulcer of other part of right foot with other specified severity E11.40 Type 2 diabetes mellitus with diabetic neuropathy, unspecified Follow-up Appointments ppointment in 1 week. - Wednesday with Dr. Dellia Nims - ***60 minutes for cast*** Return A Bathing/ Shower/ Hygiene May shower and wash wound with soap and water. - with dressing changes Off-Loading Wound #2 Right Metatarsal head first Total Contact Cast to Right Lower Extremity Wound Treatment Wound #2 - Metatarsal head first Wound Laterality: Right Cleanser: Soap and Water 1 x Per Week/7 Days Discharge Instructions: May shower and wash wound with dial antibacterial soap and water prior to dressing change. Cleanser: Wound Cleanser 1 x Per Week/7 Days Discharge Instructions: Cleanse the wound with wound cleanser prior to applying a clean dressing using gauze sponges, not tissue or cotton balls. Peri-Wound Care: Sween Lotion (Moisturizing lotion) 1 x Per Week/7 Days Discharge Instructions: Apply moisturizing lotion to foot with dressing changes Prim Dressing: Hydrofera Blue Classic Foam, 2x2 in 1 x Per Week/7 Days ary Discharge Instructions: Moisten with saline prior to applying to wound bed Secondary Dressing: Woven Gauze  Sponge, Non-Sterile 4x4 in (Generic) 1 x Per Week/7 Days Discharge Instructions: Apply over primary dressing as directed. Secured With: 29M Medipore Public affairs consultant Surgical T 2x10 (in/yd) 1 x Per Week/7 Days ape Discharge Instructions: Secure with tape as directed. Electronic Signature(s) Signed: 05/14/2021 4:29:35 PM By: Linton Ham MD Signed: 05/14/2021 5:35:33 PM By: Levan Hurst RN, BSN Entered By: Levan Hurst on 05/14/2021 09:21:33 -------------------------------------------------------------------------------- Problem List Details Patient Name: Date of Service: Raymond [name], [name] 05/14/2021 8:45 A M Medical Record Number: [id number] Patient Account Number: [id number] Date of Birth/Sex: Treating RN: 12-29-1960 (60 y.o. Raymond [name] Primary Care Provider: [name] Other Clinician: Referring Provider: Treating Provider/Extender: Lynden Ang in Treatment: 7 Active Problems ICD-10 Encounter Code Description Active Date MDM Diagnosis E11.621 Type 2 diabetes mellitus with foot ulcer 03/26/2021 No Yes L97.518 Non-pressure chronic ulcer of other part of right foot with other specified 04/23/2021 No Yes severity E11.40 Type 2 diabetes mellitus with diabetic neuropathy, unspecified 03/26/2021 No Yes Inactive Problems Resolved Problems Electronic Signature(s) Signed: 05/14/2021 4:29:35 PM By: Linton Ham MD Entered By: Linton Ham on 05/14/2021 09:23:53 -------------------------------------------------------------------------------- Progress Note Details Patient Name: Date of Service: Raymond [name], [name] 05/14/2021 8:45 A M Medical Record Number: [id number] Patient Account Number: [id number] Date of Birth/Sex: Treating RN: Nov 23, 1960 (60 y.o. Raymond [name] Primary Care Provider: [name] Other Clinician: Referring Provider: Treating Provider/Extender: Lynden Ang in Treatment: 7 Subjective History of  Present Illness (HPI) Admission 03/26/2021 Mr. Sye Schroepfer is a 60 year old male with a past medical history of uncontrolled type 2 diabetes on insulin with latest hemoglobin A1c of 15, previous left foot wound with osteomyelitis, peripheral arterial disease, status post kidney transplant, that presents to the clinic for a 1.5-year history of nonhealing wound to the plantar aspect of his right foot. He has been following with Dr. Earleen Newport, podiatry for this issue. He  had surgical debridement with bone biopsy on 03/06/2021. Biopsy did not show evidence for osteomyelitis. At that time ACell was placed. He also follows with Dr. Megan Salon for antibiotic therapy and is currently on linezolid and Augmentin. He currently denies systemic signs of infection. He has been using Vaseline to the wound bed. He currently uses a front offloading shoe. He is following Dr. Loanne Drilling for his diabetes. 11/29; patient has not been here in 4 weeks. 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 He was given silver alginate and a surgical shoe and the wound is actually come in by 1 cm in width which is quite a big improvement. He has been evaluated by Dr. Gwenlyn Found because of a history of PAD. Last seen on 04/03/2021. Repeat noninvasive studies were done on 04/12/2021 showing a noncompressible ABI on the right but a TBI of 0.95 but monophasic waveforms. On the left also noncompressible with monophasic waveforms but with a TBI of 0.68. The normal TBI's bilaterally suggest adequate blood flow 12/13; right plantar first metatarsal head diabetic ulcer. He has been treated for osteomyelitis by infectious disease. He has been evaluated by Dr. Gwenlyn Found. We have been using silver alginate under a total contact cast for the last week 12/6; the wound was dramatically better last week. Slightly smaller this week but unfortunately the tissue was nonadherent. He has been using silver alginate with a surgical shoe 12/9;  patient presents for first cast change. Has no issues or complaints today. 12/20; patient's wound is measuring smaller on the right first metatarsal head. Looks quite good and has no depth. We are using Hydrofera Blue under a total contact cast The patient comes in with very high tension. States he ran out of his clonidine patch 2 days ago because the pharmacy did not have it Objective Constitutional Patient is hypertensive.. Pulse regular and within target range for patient.[name] Respirations regular, non-labored and within target range.. Temperature is normal and within the target range for the patient.[name] Appears in no distress. Vitals Time Taken: 9:01 AM, Height: 68 in, Weight: 224 lbs, BMI: 34.1, Temperature: 97.9 F, Pulse: 65 bpm, Respiratory Rate: 18 breaths/min, Blood Pressure: 214/117 mmHg, Capillary Blood Glucose: 200 mg/dl. General Notes: Dr. Dellia Nims notified of elevated BP, patient states he ran out of Clonidine patches. General Notes: Wound exam; right first metatarsal head. No depth slightly smaller. Granulation looks healthy no evidence of surrounding infection. No debridement is required we will reapply the total contact cast Integumentary (Hair, Skin) Wound #2 status is Open. Original cause of wound was Blister. The date acquired was: 05/27/2019. The wound has been in treatment 7 weeks. The wound is located on the Right Metatarsal head first. The wound measures 1.5cm length x 0.9cm width x 0.2cm depth; 1.06cm^2 area and 0.212cm^3 volume. There is Fat Layer (Subcutaneous Tissue) exposed. There is no tunneling or undermining noted. There is a medium amount of serosanguineous drainage noted. The wound margin is well defined and not attached to the wound base. There is large (67-100%) pink, pale granulation within the wound bed. There is a small (1-33%) amount of necrotic tissue within the wound bed including Adherent Slough. Assessment Active Problems ICD-10 Type 2 diabetes mellitus with  foot ulcer Non-pressure chronic ulcer of other part of right foot with other specified severity Type 2 diabetes mellitus with diabetic neuropathy, unspecified Procedures Wound #2 Pre-procedure diagnosis of Wound #2 is a Diabetic Wound/Ulcer of the Lower Extremity located on the Right Metatarsal head first .  There was a T Contact otal Cast Procedure by Ricard Dillon., MD. Post procedure Diagnosis Wound #2: Same as Pre-Procedure Plan Follow-up Appointments: Return Appointment in 1 week. - Wednesday with Dr. Dellia Nims - ***60 minutes for cast*** Bathing/ Shower/ Hygiene: May shower and wash wound with soap and water. - with dressing changes Off-Loading: Wound #2 Right Metatarsal head first: T Contact Cast to Right Lower Extremity otal WOUND #2: - Metatarsal head first Wound Laterality: Right Cleanser: Soap and Water 1 x Per Week/7 Days Discharge Instructions: May shower and wash wound with dial antibacterial soap and water prior to dressing change. Cleanser: Wound Cleanser 1 x Per Week/7 Days Discharge Instructions: Cleanse the wound with wound cleanser prior to applying a clean dressing using gauze sponges, not tissue or cotton balls. Peri-Wound Care: Sween Lotion (Moisturizing lotion) 1 x Per Week/7 Days Discharge Instructions: Apply moisturizing lotion to foot with dressing changes Prim Dressing: Hydrofera Blue Classic Foam, 2x2 in 1 x Per Week/7 Days ary Discharge Instructions: Moisten with saline prior to applying to wound bed Secondary Dressing: Woven Gauze Sponge, Non-Sterile 4x4 in (Generic) 1 x Per Week/7 Days Discharge Instructions: Apply over primary dressing as directed. Secured With: 11M Medipore Public affairs consultant Surgical T 2x10 (in/yd) 1 x Per Week/7 Days ape Discharge Instructions: Secure with tape as directed. 1. Still using Hydrofera Blue under a total contact cast 2. The wound has come down nicely there is no depth I am hoping this will epithelialize 3. I am concerned  about the patient's high blood pressure and I discussed this with him. It appears that he is only on a clonidine patch. I am not certain why he is on a clonidine patch also putting the patch on today will take a while to have an effect. Clonidine withdrawal also came to my mind. In discussing things his blood pressures been high most days he is coming to this clinic but usually his diastolic blood pressures in the 90s. Fortunately he sees his primary doctor tomorrow Electronic Signature(s) Signed: 05/14/2021 4:29:35 PM By: Linton Ham MD Entered By: Linton Ham on 05/14/2021 09:53:39 -------------------------------------------------------------------------------- Total Contact Cast Details Patient Name: Date of Service: Raymond [name], [name] 05/14/2021 8:45 A M Medical Record Number: [id number] Patient Account Number: [id number] Date of Birth/Sex: Treating RN: February 11, 1961 (60 y.o. Raymond [name] Primary Care Provider: [name] Other Clinician: Referring Provider: Treating Provider/Extender: Lynden Ang in Treatment: 7 T Contact Cast Applied for Wound Assessment: otal Wound #2 Right Metatarsal head first Performed By: Physician Ricard Dillon., MD Post Procedure Diagnosis Same as Pre-procedure Electronic Signature(s) Signed: 05/14/2021 4:29:35 PM By: Linton Ham MD Entered By: Linton Ham on 05/14/2021 09:24:18 -------------------------------------------------------------------------------- SuperBill Details Patient Name: Date of Service: Raymond [name], [name] 05/14/2021 Medical Record Number: [id number] Patient Account Number: [id number] Date of Birth/Sex: Treating RN: [birth year] (60 y.o. Raymond [name] Primary Care Provider: [name] Other Clinician: Referring Provider: Treating Provider/Extender: Lynden Ang in Treatment: 7 Diagnosis Coding ICD-10 Codes Code Description [phone] Type 2 diabetes  mellitus with foot ulcer L97.518 Non-pressure chronic ulcer of other part of right foot with other specified severity E11.40 Type 2 diabetes mellitus with diabetic neuropathy, unspecified Facility Procedures CPT4 Code: [id number] Description: [id number] - APPLY TOTAL CONTACT LEG CAST ICD-10 Diagnosis Description L97.518 Non-pressure chronic ulcer of other part of right foot with other specified seve E11.621 Type 2 diabetes mellitus with foot ulcer Modifier: rity Quantity: 1 Physician Procedures : CPT4 Code Description  Modifier [id number] [phone] - WC PHYS APPLY TOTAL CONTACT CAST ICD-10 Diagnosis Description L97.518 Non-pressure chronic ulcer of other part of right foot with other specified severity E11.621 Type 2 diabetes mellitus with foot ulcer Quantity: 1 Electronic Signature(s) Signed: 05/14/2021 4:29:35 PM By: Linton Ham MD Entered By: Linton Ham on 05/14/2021 09:29:00

## 2021-05-15 ENCOUNTER — Other Ambulatory Visit: Payer: Self-pay

## 2021-05-15 ENCOUNTER — Encounter: Payer: Self-pay | Admitting: Internal Medicine

## 2021-05-15 ENCOUNTER — Ambulatory Visit (INDEPENDENT_AMBULATORY_CARE_PROVIDER_SITE_OTHER): Payer: BC Managed Care – PPO | Admitting: Internal Medicine

## 2021-05-15 DIAGNOSIS — L089 Local infection of the skin and subcutaneous tissue, unspecified: Secondary | ICD-10-CM | POA: Diagnosis not present

## 2021-05-15 DIAGNOSIS — E11628 Type 2 diabetes mellitus with other skin complications: Secondary | ICD-10-CM | POA: Diagnosis not present

## 2021-05-15 NOTE — Progress Notes (Signed)
Virtual Visit via Telephone Note  I connected with Raymond [name] on 05/15/21 at  8:45 AM EST by telephone and verified that I am speaking with the correct person using two identifiers.  Location: Patient: Home Provider: [facility]   I discussed the limitations, risks, security and privacy concerns of performing an evaluation and management service by telephone and the availability of in person appointments. I also discussed with the patient that there may be a patient responsible charge related to this service. The patient expressed understanding and agreed to proceed.   History of Present Illness: I called and spoke with Raymond [name] today.  He completed a long course of oral amoxicillin clavulanate, linezolid and ciprofloxacin on 04/17/2021 for his infected right plantar ulcer.  He had no evidence of ongoing infection at that time.  He is continue to be followed by Dr. Heber [place] at the wound clinic.  He is now having a full contact cast placed weekly.  He saw Dr. Heber [place] yesterday.  He has not had any fever, new wound drainage, odor, redness or swelling of his foot to indicate infection.  He said that Dr. Heber  did not indicate any concern about reinfection.   Observations/Objective:   Assessment and Plan: I remain hopeful that his infection has been cured.  Follow Up Instructions: He can follow-up here as needed.   I discussed the assessment and treatment plan with the patient. The patient was provided an opportunity to ask questions and all were answered. The patient agreed with the plan and demonstrated an understanding of the instructions.   The patient was advised to call back or seek an in-person evaluation if the symptoms worsen or if the condition fails to improve as anticipated.  I provided 14 minutes of non-face-to-face time during this encounter.   Michel Bickers, MD

## 2021-05-17 ENCOUNTER — Telehealth: Payer: Self-pay

## 2021-05-17 ENCOUNTER — Other Ambulatory Visit (HOSPITAL_COMMUNITY): Payer: Self-pay

## 2021-05-17 NOTE — Telephone Encounter (Signed)
Patient Advocate Encounter  Prior Authorization for Trulicity 0.75mg /0.34ml pen injectors has been approved.    PA#: [id number]  Effective dates: 04/17/21 through 05/17/22  Per Test Claim Patients co-pay is $0.   Spoke with Pharmacy to Process.  Patient Advocate Fax:  [phone]

## 2021-05-22 ENCOUNTER — Other Ambulatory Visit: Payer: Self-pay

## 2021-05-22 ENCOUNTER — Encounter (HOSPITAL_BASED_OUTPATIENT_CLINIC_OR_DEPARTMENT_OTHER): Payer: BC Managed Care – PPO | Admitting: Internal Medicine

## 2021-05-22 DIAGNOSIS — E11621 Type 2 diabetes mellitus with foot ulcer: Secondary | ICD-10-CM | POA: Diagnosis not present

## 2021-05-22 NOTE — Progress Notes (Signed)
Raymond [name] ([id number]) Visit Report for 05/22/2021 HPI Details Patient Name: Date of Service: Raymond [name] [name] 05/22/2021 7:30 A M Medical Record Number: [id number] Patient Account Number: [id number] Date of Birth/Sex: Treating RN: 1960-10-24 (60 y.o. Raymond [name] Primary Care Provider: [name] Other Clinician: Referring Provider: Treating Provider/Extender: Lynden Ang in Treatment: 8 History of Present Illness HPI Description: Admission 03/26/2021 Raymond [name] is a 60 year old male with a past medical history of uncontrolled type 2 diabetes on insulin with latest hemoglobin A1c of 15, previous left foot wound with osteomyelitis, peripheral arterial disease, status post kidney transplant, that presents to the clinic for a 1.5-year history of nonhealing wound to the plantar aspect of his right foot. He has been following with Dr. Earleen Newport, podiatry for this issue. He had surgical debridement with bone biopsy on 03/06/2021. Biopsy did not show evidence for osteomyelitis. At that time ACell was placed. He also follows with Dr. Megan Salon for antibiotic therapy and is currently on linezolid and Augmentin. He currently denies systemic signs of infection. He has been using Vaseline to the wound bed. He currently uses a front offloading shoe. He is following Dr. Loanne Drilling for his diabetes. 11/29; patient has not been here in 4 weeks. 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 He was given silver alginate and a surgical shoe and the wound is actually come in by 1 cm in width which is quite a big improvement. He has been evaluated by Dr. Gwenlyn Found because of a history of PAD. Last seen on 04/03/2021. Repeat noninvasive studies were done on 04/12/2021 showing a noncompressible ABI on the right but a TBI of 0.95 but monophasic waveforms. On the left also noncompressible with monophasic waveforms but with a TBI of 0.68. The normal TBI's  bilaterally suggest adequate blood flow 12/13; right plantar first metatarsal head diabetic ulcer. He has been treated for osteomyelitis by infectious disease. He has been evaluated by Dr. Gwenlyn Found. We have been using silver alginate under a total contact cast for the last week 12/6; the wound was dramatically better last week. Slightly smaller this week but unfortunately the tissue was nonadherent. He has been using silver alginate with a surgical shoe 12/9; patient presents for first cast change. Has no issues or complaints today. 12/20; patient's wound is measuring smaller on the right first metatarsal head. Looks quite good and has no depth. We are using Hydrofera Blue under a total contact cast The patient comes in with very high tension. States he ran out of his clonidine patch 2 days ago because the pharmacy did not have it 12/28; improved measurements on the wound on the plantar right first metatarsal head. The wound looks quite good has no depth. We are using Hydrofera Blue under a total contact cast Electronic Signature(s) Signed: 05/22/2021 3:46:55 PM By: Linton Ham MD Entered By: Linton Ham on 05/22/2021 08:23:14 -------------------------------------------------------------------------------- Physical Exam Details Patient Name: Date of Service: Raymond [name] 05/22/2021 7:30 A M Medical Record Number: [id number] Patient Account Number: [id number] Date of Birth/Sex: Treating RN: 05/25/1961 (60 y.o. Raymond [name] Primary Care Provider: [name] Other Clinician: Referring Provider: Treating Provider/Extender: Lynden Ang in Treatment: 8 Constitutional Patient is hypertensive.This is better than last week. Pulse regular and within target range for patient.[name] Respirations regular, non-labored and within target range.. Temperature is normal and within the target range for the patient.[name] Appears in no distress. Notes Wound exam; dorsalis  pedis pulses palpable on the right. Wound measures smaller. He has thick skin around the wound although with the improvement in surface area I did not debride this. No evidence of infection. He appears to be tolerating total contact cast well Electronic Signature(s) Signed: 05/22/2021 3:46:55 PM By: Linton Ham MD Entered By: Linton Ham on 05/22/2021 87:86:76 -------------------------------------------------------------------------------- Physician Orders Details Patient Name: Date of Service: Raymond [name] 05/22/2021 7:30 A M Medical Record Number: [id number] Patient Account Number: [id number] Date of Birth/Sex: Treating RN: [birth year] (60 y.o. Raymond [name] Primary Care Provider: [name] Other Clinician: Referring Provider: Treating Provider/Extender: Lynden Ang in Treatment: 8 Verbal / Phone Orders: No Diagnosis Coding ICD-10 Coding Code Description E11.621 Type 2 diabetes mellitus with foot ulcer L97.518 Non-pressure chronic ulcer of other part of right foot with other specified severity E11.40 Type 2 diabetes mellitus with diabetic neuropathy, unspecified Follow-up Appointments ppointment in 1 week. - Tuesday with Dr. Dellia Nims - ***60 minutes for cast*** Return A Other: - Check BP daily and keep log to take to PCP next week. Bathing/ Shower/ Hygiene May shower and wash wound with soap and water. - with dressing changes Off-Loading Wound #2 Right Metatarsal head first Total Contact Cast to Right Lower Extremity Additional Orders / Instructions Follow Nutritious Diet - Monitor and control blood sugars for wound healing. Wound Treatment Wound #2 - Metatarsal head first Wound Laterality: Right Cleanser: Soap and Water 1 x Per Week/7 Days Discharge Instructions: May shower and wash wound with dial antibacterial soap and water prior to dressing change. Cleanser: Wound Cleanser 1 x Per Week/7 Days Discharge Instructions: Cleanse the  wound with wound cleanser prior to applying a clean dressing using gauze sponges, not tissue or cotton balls. Peri-Wound Care: Sween Lotion (Moisturizing lotion) 1 x Per Week/7 Days Discharge Instructions: Apply moisturizing lotion to foot with dressing changes Prim Dressing: Hydrofera Blue Classic Foam, 2x2 in 1 x Per Week/7 Days ary Discharge Instructions: Moisten with saline prior to applying to wound bed Secondary Dressing: Woven Gauze Sponge, Non-Sterile 4x4 in (Generic) 1 x Per Week/7 Days Discharge Instructions: Apply over primary dressing as directed. Secured With: 45M Medipore Public affairs consultant Surgical T 2x10 (in/yd) 1 x Per Week/7 Days ape Discharge Instructions: Secure with tape as directed. Electronic Signature(s) Signed: 05/22/2021 3:30:35 PM By: Lorrin Jackson Signed: 05/22/2021 3:46:55 PM By: Linton Ham MD Entered By: Lorrin Jackson on 05/22/2021 08:19:02 -------------------------------------------------------------------------------- Problem List Details Patient Name: Date of Service: Raymond [name] 05/22/2021 7:30 A M Medical Record Number: [id number] Patient Account Number: [id number] Date of Birth/Sex: Treating RN: [birth year] (61 y.o. Raymond [name] Primary Care Provider: [name] Other Clinician: Referring Provider: Treating Provider/Extender: Lynden Ang in Treatment: 8 Active Problems ICD-10 Encounter Code Description Active Date MDM Diagnosis E11.621 Type 2 diabetes mellitus with foot ulcer 03/26/2021 No Yes L97.518 Non-pressure chronic ulcer of other part of right foot with other specified 04/23/2021 No Yes severity E11.40 Type 2 diabetes mellitus with diabetic neuropathy, unspecified 03/26/2021 No Yes Inactive Problems Resolved Problems Electronic Signature(s) Signed: 05/22/2021 3:46:55 PM By: Linton Ham MD Entered By: Linton Ham on 05/22/2021  08:22:26 -------------------------------------------------------------------------------- Progress Note Details Patient Name: Date of Service: Raymond [name] 05/22/2021 7:30 A M Medical Record Number: [id number] Patient Account Number: [id number] Date of Birth/Sex: Treating RN: [birth year] (60 y.o. Raymond [name] Primary Care Provider: [name] Other Clinician: Referring Provider: Treating Provider/Extender: Lynden Ang in Treatment:  8 Subjective History of Present Illness (HPI) Admission 03/26/2021 Raymond [name] is a 60 year old male with a past medical history of uncontrolled type 2 diabetes on insulin with latest hemoglobin A1c of 15, previous left foot wound with osteomyelitis, peripheral arterial disease, status post kidney transplant, that presents to the clinic for a 1.5-year history of nonhealing wound to the plantar aspect of his right foot. He has been following with Dr. Earleen Newport, podiatry for this issue. He had surgical debridement with bone biopsy on 03/06/2021. Biopsy did not show evidence for osteomyelitis. At that time ACell was placed. He also follows with Dr. Megan Salon for antibiotic therapy and is currently on linezolid and Augmentin. He currently denies systemic signs of infection. He has been using Vaseline to the wound bed. He currently uses a front offloading shoe. He is following Dr. Loanne Drilling for his diabetes. 11/29; patient has not been here in 4 weeks. 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 He was given silver alginate and a surgical shoe and the wound is actually come in by 1 cm in width which is quite a big improvement. He has been evaluated by Dr. Gwenlyn Found because of a history of PAD. Last seen on 04/03/2021. Repeat noninvasive studies were done on 04/12/2021 showing a noncompressible ABI on the right but a TBI of 0.95 but monophasic waveforms. On the left also noncompressible with monophasic waveforms  but with a TBI of 0.68. The normal TBI's bilaterally suggest adequate blood flow 12/13; right plantar first metatarsal head diabetic ulcer. He has been treated for osteomyelitis by infectious disease. He has been evaluated by Dr. Gwenlyn Found. We have been using silver alginate under a total contact cast for the last week 12/6; the wound was dramatically better last week. Slightly smaller this week but unfortunately the tissue was nonadherent. He has been using silver alginate with a surgical shoe 12/9; patient presents for first cast change. Has no issues or complaints today. 12/20; patient's wound is measuring smaller on the right first metatarsal head. Looks quite good and has no depth. We are using Hydrofera Blue under a total contact cast The patient comes in with very high tension. States he ran out of his clonidine patch 2 days ago because the pharmacy did not have it 12/28; improved measurements on the wound on the plantar right first metatarsal head. The wound looks quite good has no depth. We are using Hydrofera Blue under a total contact cast Objective Constitutional Patient is hypertensive.This is better than last week. Pulse regular and within target range for patient.[name] Respirations regular, non-labored and within target range.. Temperature is normal and within the target range for the patient.[name] Appears in no distress. Vitals Time Taken: 7:55 AM, Height: 68 in, Weight: 224 lbs, BMI: 34.1, Temperature: 98.4 F, Pulse: 70 bpm, Respiratory Rate: 18 breaths/min, Blood Pressure: 183/92 mmHg. General Notes: Patient states he hasn't checked sugar today General Notes: Wound exam; dorsalis pedis pulses palpable on the right. Wound measures smaller. He has thick skin around the wound although with the improvement in surface area I did not debride this. No evidence of infection. He appears to be tolerating total contact cast well Integumentary (Hair, Skin) Wound #2 status is Open. Original cause of  wound was Blister. The date acquired was: 05/27/2019. The wound has been in treatment 8 weeks. The wound is located on the Right Metatarsal head first. The wound measures 0.9cm length x 0.8cm width x 0.2cm depth; 0.565cm^2 area and 0.113cm^3 volume. There  is Fat Layer (Subcutaneous Tissue) exposed. There is a medium amount of serosanguineous drainage noted. The wound margin is well defined and not attached to the wound base. There is large (67-100%) pink, pale granulation within the wound bed. There is no necrotic tissue within the wound bed. General Notes: Slight maceration Assessment Active Problems ICD-10 Type 2 diabetes mellitus with foot ulcer Non-pressure chronic ulcer of other part of right foot with other specified severity Type 2 diabetes mellitus with diabetic neuropathy, unspecified Procedures Wound #2 Pre-procedure diagnosis of Wound #2 is a Diabetic Wound/Ulcer of the Lower Extremity located on the Right Metatarsal head first . There was a T Contact otal Cast Procedure by Ricard Dillon., MD. Post procedure Diagnosis Wound #2: Same as Pre-Procedure Plan Follow-up Appointments: Return Appointment in 1 week. - Tuesday with Dr. Dellia Nims - ***60 minutes for cast*** Other: - Check BP daily and keep log to take to PCP next week. Bathing/ Shower/ Hygiene: May shower and wash wound with soap and water. - with dressing changes Off-Loading: Wound #2 Right Metatarsal head first: T Contact Cast to Right Lower Extremity otal Additional Orders / Instructions: Follow Nutritious Diet - Monitor and control blood sugars for wound healing. WOUND #2: - Metatarsal head first Wound Laterality: Right Cleanser: Soap and Water 1 x Per Week/7 Days Discharge Instructions: May shower and wash wound with dial antibacterial soap and water prior to dressing change. Cleanser: Wound Cleanser 1 x Per Week/7 Days Discharge Instructions: Cleanse the wound with wound cleanser prior to applying a clean  dressing using gauze sponges, not tissue or cotton balls. Peri-Wound Care: Sween Lotion (Moisturizing lotion) 1 x Per Week/7 Days Discharge Instructions: Apply moisturizing lotion to foot with dressing changes Prim Dressing: Hydrofera Blue Classic Foam, 2x2 in 1 x Per Week/7 Days ary Discharge Instructions: Moisten with saline prior to applying to wound bed Secondary Dressing: Woven Gauze Sponge, Non-Sterile 4x4 in (Generic) 1 x Per Week/7 Days Discharge Instructions: Apply over primary dressing as directed. Secured With: 31M Medipore Public affairs consultant Surgical T 2x10 (in/yd) 1 x Per Week/7 Days ape Discharge Instructions: Secure with tape as directed. 1. I am continuing with Hydrofera Blue under the total contact cast 2. It turns out the patient's primary doctor appointment with vis--vis his hypertension is not till next week. I have asked him to take his blood pressure at home. He has a cough and I have instructed him how to do this 3. Back next week for cast change Electronic Signature(s) Signed: 05/22/2021 3:46:55 PM By: Linton Ham MD Entered By: Linton Ham on 05/22/2021 08:25:15 -------------------------------------------------------------------------------- Total Contact Cast Details Patient Name: Date of Service: Raymond [name] 05/22/2021 7:30 A M Medical Record Number: [id number] Patient Account Number: [id number] Date of Birth/Sex: Treating RN: [birth year] (60 y.o. [name], [name] Primary Care Provider: [name] Other Clinician: Referring Provider: Treating Provider/Extender: Lynden Ang in Treatment: 8 T Contact Cast Applied for Wound Assessment: otal Wound #2 Right Metatarsal head first Performed By: Physician Ricard Dillon., MD Post Procedure Diagnosis Same as Pre-procedure Electronic Signature(s) Signed: 05/22/2021 3:46:55 PM By: Linton Ham MD Entered By: Linton Ham on 05/22/2021  40:98:11 -------------------------------------------------------------------------------- SuperBill Details Patient Name: Date of Service: Raymond [name] 05/22/2021 Medical Record Number: [id number] Patient Account Number: [id number] Date of Birth/Sex: Treating RN: [birth year] (60 y.o. Raymond [name] Primary Care Provider: [name] Other Clinician: Referring Provider: Treating Provider/Extender: Lynden Ang in Treatment: 8 Diagnosis Coding ICD-10  Codes Code Description E11.621 Type 2 diabetes mellitus with foot ulcer L97.518 Non-pressure chronic ulcer of other part of right foot with other specified severity E11.40 Type 2 diabetes mellitus with diabetic neuropathy, unspecified Facility Procedures CPT4 Code: [id number] Description: [phone] - APPLY TOTAL CONTACT LEG CAST ICD-10 Diagnosis Description L97.518 Non-pressure chronic ulcer of other part of right foot with other specified sever Modifier: ity Quantity: 1 Physician Procedures : CPT4 Code Description Modifier [id number] - WC PHYS APPLY TOTAL CONTACT CAST ICD-10 Diagnosis Description L97.518 Non-pressure chronic ulcer of other part of right foot with other specified severity Quantity: 1 Electronic Signature(s) Signed: 05/22/2021 3:46:55 PM By: Linton Ham MD Entered By: Linton Ham on 05/22/2021 71:83:67

## 2021-05-22 NOTE — Progress Notes (Signed)
MARKEL, KURTENBACH ([id number]) Visit Report for 05/22/2021 Arrival Information Details Patient Name: Date of Service: [place], [name] RO LD 05/22/2021 7:30 A M Medical Record Number: [id number] Patient Account Number: [id number] Date of Birth/Sex: Treating RN: 06/08/1960 (60 y.o. Marcheta Grammes Primary Care Alecea Trego: [name] Other Clinician: Referring Aashrith Eves: Treating Wyonia Fontanella/Extender: Lynden Ang in Treatment: 8 Visit Information History Since Last Visit Added or deleted any medications: No Patient Arrived: Ambulatory Any new allergies or adverse reactions: No Arrival Time: 07:53 Had a fall or experienced change in No Transfer Assistance: None activities of daily living that may affect Patient Identification Verified: Yes risk of falls: Secondary Verification Process Completed: Yes Signs or symptoms of abuse/neglect since last visito No Patient Requires Transmission-Based Precautions: No Hospitalized since last visit: No Patient Has Alerts: No Implantable device outside of the clinic excluding No cellular tissue based products placed in the center since last visit: Has Dressing in Place as Prescribed: Yes Has Footwear/Offloading in Place as Prescribed: Yes Right: T Contact Cast otal Pain Present Now: No Electronic Signature(s) Signed: 05/22/2021 3:30:35 PM By: Lorrin Jackson Entered By: Lorrin Jackson on 05/22/2021 07:55:23 -------------------------------------------------------------------------------- Encounter Discharge Information Details Patient Name: Date of Service: [name], [name] 05/22/2021 7:30 A M Medical Record Number: [id number] Patient Account Number: [id number] Date of Birth/Sex: Treating RN: March 12, 1961 (60 y.o. Marcheta Grammes Primary Care Razi Hickle: [name] Other Clinician: Referring Caedin Mogan: Treating Ryheem Jay/Extender: Lynden Ang in Treatment: 8 Encounter Discharge Information  Items Discharge Condition: Stable Ambulatory Status: Ambulatory Discharge Destination: Home Transportation: Private Auto Schedule Follow-up Appointment: Yes Clinical Summary of Care: Provided on 05/22/2021 Form Type Recipient Paper Patient Patient Electronic Signature(s) Signed: 05/22/2021 8:40:57 AM By: Lorrin Jackson Entered By: Lorrin Jackson on 05/22/2021 08:40:56 -------------------------------------------------------------------------------- Lower Extremity Assessment Details Patient Name: Date of Service: [facility], [name] 05/22/2021 7:30 A M Medical Record Number: [id number] Patient Account Number: [id number] Date of Birth/Sex: Treating RN: [birth year] (60 y.o. Marcheta Grammes Primary Care Malyn Aytes: [name] Other Clinician: Referring Rhiana Morash: Treating Aleah Ahlgrim/Extender: Lynden Ang in Treatment: 8 Edema Assessment Assessed: Shirlyn Goltz: No] Patrice Paradise: Yes] Edema: [Left: Ye] [Right: s] Calf Left: Right: Point of Measurement: 35 cm From Medial Instep 37 cm Ankle Left: Right: Point of Measurement: 12 cm From Medial Instep 22 cm Vascular Assessment Pulses: Dorsalis Pedis Palpable: [Right:Yes] Electronic Signature(s) Signed: 05/22/2021 3:30:35 PM By: Lorrin Jackson Entered By: Lorrin Jackson on 05/22/2021 08:09:18 -------------------------------------------------------------------------------- Multi Wound Chart Details Patient Name: Date of Service: [name], [name] 05/22/2021 7:30 A M Medical Record Number: [id number] Patient Account Number: [id number] Date of Birth/Sex: Treating RN: 1960/10/25 (60 y.o. [name], [name] Primary Care Netty Sullivant: [name] Other Clinician: Referring Carvin Almas: Treating Bernestine Holsapple/Extender: Lynden Ang in Treatment: 8 Vital Signs Height(in): 46 Pulse(bpm): 32 Weight(lbs): 536 Blood Pressure(mmHg): 183/92 Body Mass Index(BMI): 34 Temperature(F): 98.4 Respiratory  Rate(breaths/min): 18 Photos: [2:Right Metatarsal head first] [N/A:N/A N/A] Wound Location: [2:Blister] [N/A:N/A] Wounding Event: [2:Diabetic Wound/Ulcer of the Lower] [N/A:N/A] Primary Etiology: [2:Extremity Cataracts, Hypertension, Peripheral N/A] Comorbid History: [2:Arterial Disease, Type II Diabetes, Osteomyelitis, Neuropathy 05/27/2019] [N/A:N/A] Date Acquired: [2:8] [N/A:N/A] Weeks of Treatment: [2:Open] [N/A:N/A] Wound Status: [2:0.9x0.8x0.2] [N/A:N/A] Measurements L x W x D (cm) [2:0.565] [N/A:N/A] A (cm) : rea [2:0.113] [N/A:N/A] Volume (cm) : [2:84.40%] [N/A:N/A] % Reduction in A rea: [2:94.80%] [N/A:N/A] % Reduction in Volume: [2:Grade 2] [N/A:N/A] Classification: [2:Medium] [N/A:N/A] Exudate A mount: [2:Serosanguineous] [N/A:N/A] Exudate Type: [2:red, brown] [N/A:N/A] Exudate Color: [  2:Well defined, not attached] [N/A:N/A] Wound Margin: [2:Large (67-100%)] [N/A:N/A] Granulation A mount: [2:Pink, Pale] [N/A:N/A] Granulation Quality: [2:None Present (0%)] [N/A:N/A] Necrotic A mount: [2:Fat Layer (Subcutaneous Tissue): Yes N/A] Exposed Structures: [2:Fascia: No Tendon: No Muscle: No Joint: No Bone: No Small (1-33%)] [N/A:N/A] Epithelialization: [2:Slight maceration] [N/A:N/A] Assessment Notes: [2:T Contact Cast otal] [N/A:N/A] Treatment Notes Electronic Signature(s) Signed: 05/22/2021 3:46:55 PM By: Linton Ham MD Signed: 05/22/2021 5:09:47 PM By: Rhae Hammock RN Entered By: Linton Ham on 05/22/2021 08:22:33 -------------------------------------------------------------------------------- Multi-Disciplinary Care Plan Details Patient Name: Date of Service: [facility], [name] 05/22/2021 7:30 A M Medical Record Number: [id number] Patient Account Number: [id number] Date of Birth/Sex: Treating RN: [birth year] (60 y.o. Marcheta Grammes Primary Care Averey Trompeter: [name] Other Clinician: Referring Lillah Standre: Treating Callahan Peddie/Extender: Lynden Ang in Treatment: [place] reviewed with physician Active Inactive Nutrition Nursing Diagnoses: Impaired glucose control: actual or potential Potential for alteratiion in Nutrition/Potential for imbalanced nutrition Goals: Patient/caregiver verbalizes understanding of need to maintain therapeutic glucose control per primary care physician Date Initiated: 03/26/2021 Date Inactivated: 04/23/2021 Target Resolution Date: 04/23/2021 Goal Status: Met Patient/caregiver will maintain therapeutic glucose control Date Initiated: 03/26/2021 Target Resolution Date: 06/18/2021 Goal Status: Active Interventions: Assess HgA1c results as ordered upon admission and as needed Provide education on elevated blood sugars and impact on wound healing Treatment Activities: Dietary management education, guidance and counseling : 03/26/2021 Special diet education : 03/26/2021 Notes: 05/22/21: Glucose control ongoing Wound/Skin Impairment Nursing Diagnoses: Impaired tissue integrity Knowledge deficit related to ulceration/compromised skin integrity Goals: Patient/caregiver will verbalize understanding of skin care regimen Date Initiated: 03/26/2021 Target Resolution Date: 06/18/2021 Goal Status: Active Ulcer/skin breakdown will have a volume reduction of 30% by week 4 Date Initiated: 03/26/2021 Date Inactivated: 04/23/2021 Target Resolution Date: 04/23/2021 Goal Status: Met Interventions: Assess patient/caregiver ability to obtain necessary supplies Assess patient/caregiver ability to perform ulcer/skin care regimen upon admission and as needed Assess ulceration(s) every visit Provide education on ulcer and skin care Treatment Activities: Skin care regimen initiated : 03/26/2021 Topical wound management initiated : 03/26/2021 Notes: 05/22/21:Wound care ongoing Electronic Signature(s) Signed: 05/22/2021 3:30:35 PM By: Lorrin Jackson Entered By: Lorrin Jackson on 05/22/2021 08:02:05 -------------------------------------------------------------------------------- Pain Assessment Details Patient Name: Date of Service: [name], [name] 05/22/2021 7:30 A M Medical Record Number: [id number] Patient Account Number: [id number] Date of Birth/Sex: Treating RN: 1960/10/25 (60 y.o. Marcheta Grammes Primary Care Kaylean Tupou: [name] Other Clinician: Referring Lucianna Ostlund: Treating Kasidi Shanker/Extender: Lynden Ang in Treatment: 8 Active Problems Location of Pain Severity and Description of Pain Patient Has Paino No Site Locations Pain Management and Medication Current Pain Management: Electronic Signature(s) Signed: 05/22/2021 3:30:35 PM By: Lorrin Jackson Entered By: Lorrin Jackson on 05/22/2021 07:59:39 -------------------------------------------------------------------------------- Patient/Caregiver Education Details Patient Name: Date of Service: [name], [name] 12/28/2022andnbsp7:30 A M Medical Record Number: [id number] Patient Account Number: [id number] Date of Birth/Gender: Treating RN: 06-26-1960 (60 y.o. Marcheta Grammes Primary Care Physician: [name] Other Clinician: Referring Physician: Treating Physician/Extender: Lynden Ang in Treatment: 8 Education Assessment Education Provided To: Patient Education Topics Provided Elevated Blood Sugar/ Impact on Healing: Methods: Explain/Verbal, Printed Responses: State content correctly Offloading: Methods: Explain/Verbal, Printed Responses: State content correctly Wound/Skin Impairment: Methods: Explain/Verbal, Printed Responses: State content correctly Electronic Signature(s) Signed: 05/22/2021 3:30:35 PM By: Lorrin Jackson Entered By: Lorrin Jackson on 05/22/2021 08:04:00 -------------------------------------------------------------------------------- Wound Assessment Details Patient Name: Date of  Service: [name], [name] 05/22/2021 7:30  A M Medical Record Number: [id number] Patient Account Number: [id number] Date of Birth/Sex: Treating RN: [birth year] (60 y.o. Marcheta Grammes Primary Care Axell Trigueros: [name] Other Clinician: Referring Tasman Zapata: Treating Yader Criger/Extender: Lynden Ang in Treatment: 8 Wound Status Wound Number: 2 Primary Diabetic Wound/Ulcer of the Lower Extremity Etiology: Wound Location: Right Metatarsal head first Wound Open Wounding Event: Blister Status: Date Acquired: 05/27/2019 Comorbid Cataracts, Hypertension, Peripheral Arterial Disease, Type II Weeks Of Treatment: 8 History: Diabetes, Osteomyelitis, Neuropathy Clustered Wound: No Photos Wound Measurements Length: (cm) 0.9 Width: (cm) 0.8 Depth: (cm) 0.2 Area: (cm) 0.565 Volume: (cm) 0.113 % Reduction in Area: 84.4% % Reduction in Volume: 94.8% Epithelialization: Small (1-33%) Wound Description Classification: Grade 2 Wound Margin: Well defined, not attached Exudate Amount: Medium Exudate Type: Serosanguineous Exudate Color: red, brown Foul Odor After Cleansing: No Slough/Fibrino No Wound Bed Granulation Amount: Large (67-100%) Exposed Structure Granulation Quality: Pink, Pale Fascia Exposed: No Necrotic Amount: None Present (0%) Fat Layer (Subcutaneous Tissue) Exposed: Yes Tendon Exposed: No Muscle Exposed: No Joint Exposed: No Bone Exposed: No Assessment Notes Slight maceration Treatment Notes Wound #2 (Metatarsal head first) Wound Laterality: Right Cleanser Soap and Water Discharge Instruction: May shower and wash wound with dial antibacterial soap and water prior to dressing change. Wound Cleanser Discharge Instruction: Cleanse the wound with wound cleanser prior to applying a clean dressing using gauze sponges, not tissue or cotton balls. Peri-Wound Care Sween Lotion (Moisturizing lotion) Discharge Instruction: Apply moisturizing lotion  to foot with dressing changes Topical Primary Dressing Hydrofera Blue Classic Foam, 2x2 in Discharge Instruction: Moisten with saline prior to applying to wound bed Secondary Dressing Woven Gauze Sponge, Non-Sterile 4x4 in Discharge Instruction: Apply over primary dressing as directed. Secured With [employer] Surgical T 2x10 (in/yd) ape Discharge Instruction: Secure with tape as directed. Compression Wrap Compression Stockings Add-Ons Electronic Signature(s) Signed: 05/22/2021 3:30:35 PM By: Lorrin Jackson Entered By: Lorrin Jackson on 05/22/2021 08:08:30 -------------------------------------------------------------------------------- Vitals Details Patient Name: Date of Service: [name], [name] 05/22/2021 7:30 A M Medical Record Number: [id number] Patient Account Number: [id number] Date of Birth/Sex: Treating RN: [birth year] (60 y.o. Marcheta Grammes Primary Care Modine Oppenheimer: [name] Other Clinician: Referring Dade Rodin: Treating Shamila Lerch/Extender: Lynden Ang in Treatment: 8 Vital Signs Time Taken: 07:55 Temperature (F): 98.4 Height (in): 68 Pulse (bpm): 70 Weight (lbs): 224 Respiratory Rate (breaths/min): 18 Body Mass Index (BMI): 34.1 Blood Pressure (mmHg): 183/92 Reference Range: 80 - 120 mg / dl Notes Patient states he hasn't checked sugar today Electronic Signature(s) Signed: 05/22/2021 3:30:35 PM By: Lorrin Jackson Entered By: Lorrin Jackson on 05/22/2021 07:56:39

## 2021-05-28 ENCOUNTER — Other Ambulatory Visit: Payer: Self-pay

## 2021-05-28 ENCOUNTER — Telehealth: Payer: Self-pay | Admitting: Podiatry

## 2021-05-28 ENCOUNTER — Encounter (HOSPITAL_BASED_OUTPATIENT_CLINIC_OR_DEPARTMENT_OTHER): Payer: BC Managed Care – PPO | Attending: Internal Medicine | Admitting: Internal Medicine

## 2021-05-28 DIAGNOSIS — E1151 Type 2 diabetes mellitus with diabetic peripheral angiopathy without gangrene: Secondary | ICD-10-CM | POA: Diagnosis not present

## 2021-05-28 DIAGNOSIS — Z940 Kidney transplant status: Secondary | ICD-10-CM | POA: Insufficient documentation

## 2021-05-28 DIAGNOSIS — L97518 Non-pressure chronic ulcer of other part of right foot with other specified severity: Secondary | ICD-10-CM | POA: Insufficient documentation

## 2021-05-28 DIAGNOSIS — E1140 Type 2 diabetes mellitus with diabetic neuropathy, unspecified: Secondary | ICD-10-CM | POA: Diagnosis not present

## 2021-05-28 DIAGNOSIS — Z794 Long term (current) use of insulin: Secondary | ICD-10-CM | POA: Insufficient documentation

## 2021-05-28 DIAGNOSIS — E11621 Type 2 diabetes mellitus with foot ulcer: Secondary | ICD-10-CM | POA: Insufficient documentation

## 2021-05-28 NOTE — Progress Notes (Signed)
CARDARIUS, SENAT ([id number]) Visit Report for 05/28/2021 Arrival Information Details Patient Name: Date of Service: [place], [name] RO LD 05/28/2021 7:30 A M Medical Record Number: [id number] Patient Account Number: [id number] Date of Birth/Sex: Treating RN: 03-12-1961 (61 y.o. [name] Primary Care : [name] Other Clinician: Referring : Treating /Extender: Lynden Ang in Treatment: 9 Visit Information History Since Last Visit Added or deleted any medications: No Patient Arrived: Ambulatory Any new allergies or adverse reactions: No Arrival Time: 07:51 Had a fall or experienced change in No Accompanied By: alone activities of daily living that may affect Transfer Assistance: None risk of falls: Patient Identification Verified: Yes Signs or symptoms of abuse/neglect since last visito No Secondary Verification Process Completed: Yes Hospitalized since last visit: No Patient Requires Transmission-Based Precautions: No Implantable device outside of the clinic excluding No Patient Has Alerts: No cellular tissue based products placed in the center since last visit: Has Dressing in Place as Prescribed: Yes Has Footwear/Offloading in Place as Prescribed: Yes Right: T Contact Cast otal Pain Present Now: No Electronic Signature(s) Signed: 05/28/2021 5:47:12 PM By: Levan Hurst RN, BSN Entered By: Levan Hurst on 05/28/2021 07:52:21 -------------------------------------------------------------------------------- Encounter Discharge Information Details Patient Name: Date of Service: [name], [name] 05/28/2021 7:30 A M Medical Record Number: [id number] Patient Account Number: [id number] Date of Birth/Sex: Treating RN: 1961/03/31 (61 y.o. [name] Primary Care : [name] Other Clinician: Referring : Treating /Extender: Lynden Ang in Treatment: 9 Encounter  Discharge Information Items Discharge Condition: Stable Ambulatory Status: Ambulatory Discharge Destination: Home Transportation: Private Auto Accompanied By: alone Schedule Follow-up Appointment: Yes Clinical Summary of Care: Patient Declined Electronic Signature(s) Signed: 05/28/2021 5:47:12 PM By: Levan Hurst RN, BSN Entered By: Levan Hurst on 05/28/2021 08:22:40 -------------------------------------------------------------------------------- Lower Extremity Assessment Details Patient Name: Date of Service: [name], [name] 05/28/2021 7:30 A M Medical Record Number: [id number] Patient Account Number: [id number] Date of Birth/Sex: Treating RN: [birth year] (61 y.o. [name] Primary Care : [name] Other Clinician: Referring : Treating /Extender: Lynden Ang in Treatment: 9 Edema Assessment Assessed: Shirlyn Goltz: No] Patrice Paradise: No] Edema: [Left: Ye] [Right: s] Calf Left: Right: Point of Measurement: 35 cm From Medial Instep 38 cm Ankle Left: Right: Point of Measurement: 12 cm From Medial Instep 23 cm Vascular Assessment Pulses: Dorsalis Pedis Palpable: [Right:Yes] Posterior Tibial Palpable: [Right:Yes] Electronic Signature(s) Signed: 05/28/2021 5:47:12 PM By: Levan Hurst RN, BSN Entered By: Levan Hurst on 05/28/2021 08:00:30 -------------------------------------------------------------------------------- Multi Wound Chart Details Patient Name: Date of Service: [name], [name] 05/28/2021 7:30 A M Medical Record Number: [id number] Patient Account Number: [id number] Date of Birth/Sex: Treating RN: 1960-09-02 (61 y.o. [name] Primary Care : [name] Other Clinician: Referring : Treating /Extender: Lynden Ang in Treatment: 9 Vital Signs Height(in): 52 Capillary Blood Glucose(mg/dl): 212 Weight(lbs): 224 Pulse(bpm): 4 Body Mass  Index(BMI): 34 Blood Pressure(mmHg): 146/83 Temperature(F): 98.2 Respiratory Rate(breaths/min): 18 Photos: [2:Right Metatarsal head first] [N/A:N/A N/A] Wound Location: [2:Blister] [N/A:N/A] Wounding Event: [2:Diabetic Wound/Ulcer of the Lower] [N/A:N/A] Primary Etiology: [2:Extremity Cataracts, Hypertension, Peripheral N/A] Comorbid History: [2:Arterial Disease, Type II Diabetes, Osteomyelitis, Neuropathy 05/27/2019] [N/A:N/A] Date Acquired: [2:9] [N/A:N/A] Weeks of Treatment: [2:Open] [N/A:N/A] Wound Status: [2:0.5x0.3x0.2] [N/A:N/A] Measurements L x W x D (cm) [2:0.118] [N/A:N/A] A (cm) : rea [2:0.024] [N/A:N/A] Volume (cm) : [2:96.70%] [N/A:N/A] % Reduction in A rea: [2:98.90%] [N/A:N/A] % Reduction in Volume: [2:Grade 2] [N/A:N/A] Classification: [2:Medium] [  N/A:N/A] Exudate A mount: [2:Serosanguineous] [N/A:N/A] Exudate Type: [2:red, brown] [N/A:N/A] Exudate Color: [2:Well defined, not attached] [N/A:N/A] Wound Margin: [2:Large (67-100%)] [N/A:N/A] Granulation A mount: [2:Pink, Pale] [N/A:N/A] Granulation Quality: [2:None Present (0%)] [N/A:N/A] Necrotic A mount: [2:Fat Layer (Subcutaneous Tissue): Yes N/A] Exposed Structures: [2:Fascia: No Tendon: No Muscle: No Joint: No Bone: No Medium (34-66%)] [N/A:N/A] Epithelialization: [2:T Contact Cast otal] [N/A:N/A] Treatment Notes Electronic Signature(s) Signed: 05/28/2021 3:57:23 PM By: Linton Ham MD Signed: 05/28/2021 5:47:12 PM By: Levan Hurst RN, BSN Entered By: Linton Ham on 05/28/2021 08:11:06 -------------------------------------------------------------------------------- Multi-Disciplinary Care Plan Details Patient Name: Date of Service: [name], [name] 05/28/2021 7:30 A M Medical Record Number: [id number] Patient Account Number: [id number] Date of Birth/Sex: Treating RN: [birth year] (61 y.o. [name] Primary Care : [name] Other Clinician: Referring : Treating  /Extender: Lynden Ang in Treatment: [place] reviewed with physician Active Inactive Nutrition Nursing Diagnoses: Impaired glucose control: actual or potential Potential for alteratiion in Nutrition/Potential for imbalanced nutrition Goals: Patient/caregiver verbalizes understanding of need to maintain therapeutic glucose control per primary care physician Date Initiated: 03/26/2021 Date Inactivated: 04/23/2021 Target Resolution Date: 04/23/2021 Goal Status: Met Patient/caregiver will maintain therapeutic glucose control Date Initiated: 03/26/2021 Target Resolution Date: 06/18/2021 Goal Status: Active Interventions: Assess HgA1c results as ordered upon admission and as needed Provide education on elevated blood sugars and impact on wound healing Treatment Activities: Dietary management education, guidance and counseling : 03/26/2021 Special diet education : 03/26/2021 Notes: 05/22/21: Glucose control ongoing Wound/Skin Impairment Nursing Diagnoses: Impaired tissue integrity Knowledge deficit related to ulceration/compromised skin integrity Goals: Patient/caregiver will verbalize understanding of skin care regimen Date Initiated: 03/26/2021 Target Resolution Date: 06/18/2021 Goal Status: Active Ulcer/skin breakdown will have a volume reduction of 30% by week 4 Date Initiated: 03/26/2021 Date Inactivated: 04/23/2021 Target Resolution Date: 04/23/2021 Goal Status: Met Interventions: Assess patient/caregiver ability to obtain necessary supplies Assess patient/caregiver ability to perform ulcer/skin care regimen upon admission and as needed Assess ulceration(s) every visit Provide education on ulcer and skin care Treatment Activities: Skin care regimen initiated : 03/26/2021 Topical wound management initiated : 03/26/2021 Notes: 05/22/21:Wound care ongoing Electronic Signature(s) Signed: 05/28/2021 5:47:12 PM By: Levan Hurst  RN, BSN Entered By: Levan Hurst on 05/28/2021 08:06:03 -------------------------------------------------------------------------------- Pain Assessment Details Patient Name: Date of Service: [name], [name] 05/28/2021 7:30 A M Medical Record Number: [id number] Patient Account Number: [id number] Date of Birth/Sex: Treating RN: Sep 03, 1960 (61 y.o. [name] Primary Care : [name] Other Clinician: Referring : Treating /Extender: Lynden Ang in Treatment: 9 Active Problems Location of Pain Severity and Description of Pain Patient Has Paino No Site Locations Pain Management and Medication Current Pain Management: Electronic Signature(s) Signed: 05/28/2021 5:47:12 PM By: Levan Hurst RN, BSN Entered By: Levan Hurst on 05/28/2021 07:53:51 -------------------------------------------------------------------------------- Patient/Caregiver Education Details Patient Name: Date of Service: [name], [name] 1/3/2023andnbsp7:30 A M Medical Record Number: [id number] Patient Account Number: [id number] Date of Birth/Gender: Treating RN: June 04, 1960 (61 y.o. [name] Primary Care Physician: [name] Other Clinician: Referring Physician: Treating Physician/Extender: Lynden Ang in Treatment: 9 Education Assessment Education Provided To: Patient Education Topics Provided Wound/Skin Impairment: Methods: Explain/Verbal Responses: State content correctly [employer]) Signed: 05/28/2021 5:47:12 PM By: Levan Hurst RN, BSN Entered By: Levan Hurst on 05/28/2021 08:06:13 -------------------------------------------------------------------------------- Wound Assessment Details Patient Name: Date of Service: [name], [name] 05/28/2021 7:30 A M Medical Record Number: [id number] Patient Account  Number: [id number] Date of Birth/Sex: Treating RN: [birth year] (61 y.o. [name] Primary Care Deroy Noah: [name] Other Clinician: Referring Jimmy Plessinger: Treating Rupa Lagan/Extender: Lynden Ang in Treatment: 9 Wound Status Wound Number: 2 Primary Diabetic Wound/Ulcer of the Lower Extremity Etiology: Wound Location: Right Metatarsal head first Wound Open Wounding Event: Blister Status: Date Acquired: 05/27/2019 Comorbid Cataracts, Hypertension, Peripheral Arterial Disease, Type II Weeks Of Treatment: 9 History: Diabetes, Osteomyelitis, Neuropathy Clustered Wound: No Photos Wound Measurements Length: (cm) 0.5 Width: (cm) 0.3 Depth: (cm) 0.2 Area: (cm) 0.118 Volume: (cm) 0.024 % Reduction in Area: 96.7% % Reduction in Volume: 98.9% Epithelialization: Medium (34-66%) Tunneling: No Undermining: No Wound Description Classification: Grade 2 Wound Margin: Well defined, not attached Exudate Amount: Medium Exudate Type: Serosanguineous Exudate Color: red, brown Foul Odor After Cleansing: No Slough/Fibrino No Wound Bed Granulation Amount: Large (67-100%) Exposed Structure Granulation Quality: Pink, Pale Fascia Exposed: No Necrotic Amount: None Present (0%) Fat Layer (Subcutaneous Tissue) Exposed: Yes Tendon Exposed: No Muscle Exposed: No Joint Exposed: No Bone Exposed: No Treatment Notes Wound #2 (Metatarsal head first) Wound Laterality: Right Cleanser Soap and Water Discharge Instruction: May shower and wash wound with dial antibacterial soap and water prior to dressing change. Wound Cleanser Discharge Instruction: Cleanse the wound with wound cleanser prior to applying a clean dressing using gauze sponges, not tissue or cotton balls. Peri-Wound Care Sween Lotion (Moisturizing lotion) Discharge Instruction: Apply moisturizing lotion to foot with dressing changes Topical Primary Dressing Hydrofera Blue Classic Foam, 2x2 in Discharge Instruction: Moisten with saline prior to applying to wound bed Secondary  Dressing Woven Gauze Sponge, Non-Sterile 4x4 in Discharge Instruction: Apply over primary dressing as directed. Secured With [employer] Surgical T 2x10 (in/yd) ape Discharge Instruction: Secure with tape as directed. Compression Wrap Compression Stockings Add-Ons Electronic Signature(s) Signed: 05/28/2021 4:35:58 PM By: Rhae Hammock RN Signed: 05/28/2021 5:47:12 PM By: Levan Hurst RN, BSN Entered By: Rhae Hammock on 05/28/2021 08:01:58 -------------------------------------------------------------------------------- Vitals Details Patient Name: Date of Service: [name], [name] 05/28/2021 7:30 A M Medical Record Number: [id number] Patient Account Number: [id number] Date of Birth/Sex: Treating RN: March 14, 1961 (61 y.o. [name] Primary Care Finbar Nippert: [name] Other Clinician: Referring Dannelle Rhymes: Treating Chaka Boyson/Extender: Lynden Ang in Treatment: 9 Vital Signs Time Taken: 07:51 Temperature (F): 98.2 Height (in): 68 Pulse (bpm): 69 Weight (lbs): 224 Respiratory Rate (breaths/min): 18 Body Mass Index (BMI): 34.1 Blood Pressure (mmHg): 146/83 Capillary Blood Glucose (mg/dl): 212 Reference Range: 80 - 120 mg / dl Notes glucose per pt report Electronic Signature(s) Signed: 05/28/2021 5:47:12 PM By: Levan Hurst RN, BSN Entered By: Levan Hurst on 05/28/2021 07:53:46

## 2021-05-28 NOTE — Telephone Encounter (Signed)
Pt left message this morning with questions about cost of him getting diabetic shoes/inserts.  I returned call and it sounded as if someone picked up but would not answer. I will wait for pt to call me back and give him the codes to call to verify his insurance himself.

## 2021-05-28 NOTE — Progress Notes (Signed)
[name], [name] ([id number]) Visit Report for 05/28/2021 HPI Details Patient Name: Date of Service: Raymond [name], [name] RO LD 05/28/2021 7:30 A M Medical Record Number: [id number] Patient Account Number: [id number] Date of Birth/Sex: Treating RN: 1960/06/04 (60 y.o. Raymond [name] Primary Care Provider: [name] Other Clinician: Referring Provider: Treating Provider/Extender: Lynden Ang in Treatment: 9 History of Present Illness HPI Description: Admission 03/26/2021 [name] is a 61 year old male with a past medical history of uncontrolled type 2 diabetes on insulin with latest hemoglobin A1c of 15, previous left foot wound with osteomyelitis, peripheral arterial disease, status post kidney transplant, that presents to the clinic for a 1.5-year history of nonhealing wound to the plantar aspect of his right foot. He has been following with Dr. Earleen Newport, podiatry for this issue. He had surgical debridement with bone biopsy on 03/06/2021. Biopsy did not show evidence for osteomyelitis. At that time ACell was placed. He also follows with Dr. Megan Salon for antibiotic therapy and is currently on linezolid and Augmentin. He currently denies systemic signs of infection. He has been using Vaseline to the wound bed. He currently uses a front offloading shoe. He is following Dr. Loanne Drilling for his diabetes. 11/29; patient has not been here in 4 weeks. 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 He was given silver alginate and a surgical shoe and the wound is actually come in by 1 cm in width which is quite a big improvement. He has been evaluated by Dr. Gwenlyn Found because of a history of PAD. Last seen on 04/03/2021. Repeat noninvasive studies were done on 04/12/2021 showing a noncompressible ABI on the right but a TBI of 0.95 but monophasic waveforms. On the left also noncompressible with monophasic waveforms but with a TBI of 0.68. The normal TBI's  bilaterally suggest adequate blood flow 12/13; right plantar first metatarsal head diabetic ulcer. He has been treated for osteomyelitis by infectious disease. He has been evaluated by Dr. Gwenlyn Found. We have been using silver alginate under a total contact cast for the last week 12/6; the wound was dramatically better last week. Slightly smaller this week but unfortunately the tissue was nonadherent. He has been using silver alginate with a surgical shoe 12/9; patient presents for first cast change. Has no issues or complaints today. 12/20; patient's wound is measuring smaller on the right first metatarsal head. Looks quite good and has no depth. We are using Hydrofera Blue under a total contact cast The patient comes in with very high BP. States he ran out of his clonidine patch 2 days ago because the pharmacy did not have it 12/28; improved measurements on the wound on the plantar right first metatarsal head. The wound looks quite good has no depth. We are using Hydrofera Blue under a total contact cast 2023 05/28/2021; patient is here in follow-up with Hydrofera Blue and total contact cast on his right first metatarsal head wound. He continues to make excellent progress. Wound is smaller looks healthy. Electronic Signature(s) Signed: 05/28/2021 3:57:23 PM By: Linton Ham MD Entered By: Linton Ham on 05/28/2021 08:13:13 -------------------------------------------------------------------------------- Physical Exam Details Patient Name: Date of Service: Raymond [name], [name] 05/28/2021 7:30 A M Medical Record Number: [id number] Patient Account Number: [id number] Date of Birth/Sex: Treating RN: 1961-03-03 (61 y.o. Raymond [name] Primary Care Provider: [name] Other Clinician: Referring Provider: Treating Provider/Extender: Lynden Ang in Treatment: 9 Constitutional Patient is hypertensive.. Pulse regular and within target range for patient.[name]  Respirations  regular, non-labored and within target range.. Temperature is normal and within the target range for the patient.[name] Appears in no distress. Cardiovascular Pedal pulses palpable on the right. Notes Wound exam; small linear wound looks like it is epithelializing. Still some thick skin and callus around the wound but no worse than last time I saw this in fact it looks somewhat better. No evidence of concurrent infection. We appear to be making excellent progress Electronic Signature(s) Signed: 05/28/2021 3:57:23 PM By: Linton Ham MD Entered By: Linton Ham on 05/28/2021 08:15:13 -------------------------------------------------------------------------------- Physician Orders Details Patient Name: Date of Service: Raymond [name], [name] 05/28/2021 7:30 A M Medical Record Number: [id number] Patient Account Number: [id number] Date of Birth/Sex: Treating RN: 1960-09-12 (60 y.o. Raymond [name] Primary Care Provider: [name] Other Clinician: Referring Provider: Treating Provider/Extender: Lynden Ang in Treatment: 9 Verbal / Phone Orders: No Diagnosis Coding ICD-10 Coding Code Description E11.621 Type 2 diabetes mellitus with foot ulcer L97.518 Non-pressure chronic ulcer of other part of right foot with other specified severity E11.40 Type 2 diabetes mellitus with diabetic neuropathy, unspecified Follow-up Appointments ppointment in 1 week. - Tuesday with Dr. Dellia Nims - ***60 minutes for cast*** Return A Bathing/ Shower/ Hygiene May shower and wash wound with soap and water. - with dressing changes Off-Loading Wound #2 Right Metatarsal head first Total Contact Cast to Right Lower Extremity Additional Orders / Instructions Follow Nutritious Diet - Monitor and control blood sugars for wound healing. Wound Treatment Wound #2 - Metatarsal head first Wound Laterality: Right Cleanser: Soap and Water 1 x Per Week/7 Days Discharge Instructions: May shower and  wash wound with dial antibacterial soap and water prior to dressing change. Cleanser: Wound Cleanser 1 x Per Week/7 Days Discharge Instructions: Cleanse the wound with wound cleanser prior to applying a clean dressing using gauze sponges, not tissue or cotton balls. Peri-Wound Care: Sween Lotion (Moisturizing lotion) 1 x Per Week/7 Days Discharge Instructions: Apply moisturizing lotion to foot with dressing changes Prim Dressing: Hydrofera Blue Classic Foam, 2x2 in 1 x Per Week/7 Days ary Discharge Instructions: Moisten with saline prior to applying to wound bed Secondary Dressing: Woven Gauze Sponge, Non-Sterile 4x4 in (Generic) 1 x Per Week/7 Days Discharge Instructions: Apply over primary dressing as directed. Secured With: 60M Medipore Public affairs consultant Surgical T 2x10 (in/yd) 1 x Per Week/7 Days ape Discharge Instructions: Secure with tape as directed. Electronic Signature(s) Signed: 05/28/2021 3:57:23 PM By: Linton Ham MD Signed: 05/28/2021 5:47:12 PM By: Levan Hurst RN, BSN Entered By: Levan Hurst on 05/28/2021 08:07:43 -------------------------------------------------------------------------------- Problem List Details Patient Name: Date of Service: Raymond [name], [name] 05/28/2021 7:30 A M Medical Record Number: [id number] Patient Account Number: [id number] Date of Birth/Sex: Treating RN: 1961-03-02 (61 y.o. Raymond [name] Primary Care Provider: [name] Other Clinician: Referring Provider: Treating Provider/Extender: Lynden Ang in Treatment: 9 Active Problems ICD-10 Encounter Code Description Active Date MDM Diagnosis E11.621 Type 2 diabetes mellitus with foot ulcer 03/26/2021 No Yes L97.518 Non-pressure chronic ulcer of other part of right foot with other specified 04/23/2021 No Yes severity E11.40 Type 2 diabetes mellitus with diabetic neuropathy, unspecified 03/26/2021 No Yes Inactive Problems Resolved Problems Electronic  Signature(s) Signed: 05/28/2021 3:57:23 PM By: Linton Ham MD Entered By: Linton Ham on 05/28/2021 08:11:00 -------------------------------------------------------------------------------- Progress Note Details Patient Name: Date of Service: Raymond [name], [name] 05/28/2021 7:30 A M Medical Record Number: [id number] Patient Account Number: [id number] Date of Birth/Sex: Treating RN:  [birth year] (61 y.o. Raymond [name] Primary Care Provider: [name] Other Clinician: Referring Provider: Treating Provider/Extender: Lynden Ang in Treatment: 9 Subjective History of Present Illness (HPI) Admission 03/26/2021 Mr. Llewyn Heap is a 61 year old male with a past medical history of uncontrolled type 2 diabetes on insulin with latest hemoglobin A1c of 15, previous left foot wound with osteomyelitis, peripheral arterial disease, status post kidney transplant, that presents to the clinic for a 1.5-year history of nonhealing wound to the plantar aspect of his right foot. He has been following with Dr. Earleen Newport, podiatry for this issue. He had surgical debridement with bone biopsy on 03/06/2021. Biopsy did not show evidence for osteomyelitis. At that time ACell was placed. He also follows with Dr. Megan Salon for antibiotic therapy and is currently on linezolid and Augmentin. He currently denies systemic signs of infection. He has been using Vaseline to the wound bed. He currently uses a front offloading shoe. He is following Dr. Loanne Drilling for his diabetes. 11/29; patient has not been here in 4 weeks. 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 He was given silver alginate and a surgical shoe and the wound is actually come in by 1 cm in width which is quite a big improvement. He has been evaluated by Dr. Gwenlyn Found because of a history of PAD. Last seen on 04/03/2021. Repeat noninvasive studies were done on 04/12/2021 showing a noncompressible ABI on the  right but a TBI of 0.95 but monophasic waveforms. On the left also noncompressible with monophasic waveforms but with a TBI of 0.68. The normal TBI's bilaterally suggest adequate blood flow 12/13; right plantar first metatarsal head diabetic ulcer. He has been treated for osteomyelitis by infectious disease. He has been evaluated by Dr. Gwenlyn Found. We have been using silver alginate under a total contact cast for the last week 12/6; the wound was dramatically better last week. Slightly smaller this week but unfortunately the tissue was nonadherent. He has been using silver alginate with a surgical shoe 12/9; patient presents for first cast change. Has no issues or complaints today. 12/20; patient's wound is measuring smaller on the right first metatarsal head. Looks quite good and has no depth. We are using Hydrofera Blue under a total contact cast The patient comes in with very high BP. States he ran out of his clonidine patch 2 days ago because the pharmacy did not have it 12/28; improved measurements on the wound on the plantar right first metatarsal head. The wound looks quite good has no depth. We are using Hydrofera Blue under a total contact cast 2023 05/28/2021; patient is here in follow-up with Hydrofera Blue and total contact cast on his right first metatarsal head wound. He continues to make excellent progress. Wound is smaller looks healthy. Objective Constitutional Patient is hypertensive.. Pulse regular and within target range for patient.[name] Respirations regular, non-labored and within target range.. Temperature is normal and within the target range for the patient.[name] Appears in no distress. Vitals Time Taken: 7:51 AM, Height: 68 in, Weight: 224 lbs, BMI: 34.1, Temperature: 98.2 F, Pulse: 69 bpm, Respiratory Rate: 18 breaths/min, Blood Pressure: 146/83 mmHg, Capillary Blood Glucose: 212 mg/dl. General Notes: glucose per pt report Cardiovascular Pedal pulses palpable on the  right. General Notes: Wound exam; small linear wound looks like it is epithelializing. Still some thick skin and callus around the wound but no worse than last time I saw this in fact it looks somewhat better. No evidence of concurrent  infection. We appear to be making excellent progress Integumentary (Hair, Skin) Wound #2 status is Open. Original cause of wound was Blister. The date acquired was: 05/27/2019. The wound has been in treatment 9 weeks. The wound is located on the Right Metatarsal head first. The wound measures 0.5cm length x 0.3cm width x 0.2cm depth; 0.118cm^2 area and 0.024cm^3 volume. There is Fat Layer (Subcutaneous Tissue) exposed. There is no tunneling or undermining noted. There is a medium amount of serosanguineous drainage noted. The wound margin is well defined and not attached to the wound base. There is large (67-100%) pink, pale granulation within the wound bed. There is no necrotic tissue within the wound bed. Assessment Active Problems ICD-10 Type 2 diabetes mellitus with foot ulcer Non-pressure chronic ulcer of other part of right foot with other specified severity Type 2 diabetes mellitus with diabetic neuropathy, unspecified Procedures Wound #2 Pre-procedure diagnosis of Wound #2 is a Diabetic Wound/Ulcer of the Lower Extremity located on the Right Metatarsal head first . There was a T Contact otal Cast Procedure by Ricard Dillon., MD. Post procedure Diagnosis Wound #2: Same as Pre-Procedure Plan Follow-up Appointments: Return Appointment in 1 week. - Tuesday with Dr. Dellia Nims - ***60 minutes for cast*** Bathing/ Shower/ Hygiene: May shower and wash wound with soap and water. - with dressing changes Off-Loading: Wound #2 Right Metatarsal head first: T Contact Cast to Right Lower Extremity otal Additional Orders / Instructions: Follow Nutritious Diet - Monitor and control blood sugars for wound healing. WOUND #2: - Metatarsal head first Wound Laterality:  Right Cleanser: Soap and Water 1 x Per Week/7 Days Discharge Instructions: May shower and wash wound with dial antibacterial soap and water prior to dressing change. Cleanser: Wound Cleanser 1 x Per Week/7 Days Discharge Instructions: Cleanse the wound with wound cleanser prior to applying a clean dressing using gauze sponges, not tissue or cotton balls. Peri-Wound Care: Sween Lotion (Moisturizing lotion) 1 x Per Week/7 Days Discharge Instructions: Apply moisturizing lotion to foot with dressing changes Prim Dressing: Hydrofera Blue Classic Foam, 2x2 in 1 x Per Week/7 Days ary Discharge Instructions: Moisten with saline prior to applying to wound bed Secondary Dressing: Woven Gauze Sponge, Non-Sterile 4x4 in (Generic) 1 x Per Week/7 Days Discharge Instructions: Apply over primary dressing as directed. Secured With: 2M Medipore Public affairs consultant Surgical T 2x10 (in/yd) 1 x Per Week/7 Days ape Discharge Instructions: Secure with tape as directed. 1. We continue with a total contact cast and Hydrofera Blue 2. He says he is much improved in terms of his diabetic control running around 200 he is on insulin and Trulicity. 3. Blood pressure in the 140 range both here and at home terms of systolic blood pressure this is a lot better than what we have been recording previously 4. I have asked him to bring his shoe when next week as he may be healed. Also asked him for an insole and perhaps callus pads to wear over this area. Finally we did have a conversation about custom-made diabetic shoes and inserts, advised him to talk to his insurance to see if there is any coverage 5. We filled out his short-term disability. He works as an Cabin crew. I put him out for a month Electronic Signature(s) Signed: 05/28/2021 8:30:11 AM By: Linton Ham MD Entered By: Linton Ham on 05/28/2021 08:30:11 -------------------------------------------------------------------------------- Total Contact Cast  Details Patient Name: Date of Service: Raymond [name], [name] 05/28/2021 7:30 A M Medical Record Number: [id number] Patient Account Number:  [id number] Date of Birth/Sex: Treating RN: [birth year] (61 y.o. Raymond [name] Primary Care Provider: [name] Other Clinician: Referring Provider: Treating Provider/Extender: Lynden Ang in Treatment: 9 T Contact Cast Applied for Wound Assessment: otal Wound #2 Right Metatarsal head first Performed By: Physician Ricard Dillon., MD Post Procedure Diagnosis Same as Pre-procedure Electronic Signature(s) Signed: 05/28/2021 3:57:23 PM By: Linton Ham MD Entered By: Linton Ham on 05/28/2021 08:17:45 -------------------------------------------------------------------------------- SuperBill Details Patient Name: Date of Service: Raymond [name], [name] 05/28/2021 Medical Record Number: [id number] Patient Account Number: [id number] Date of Birth/Sex: Treating RN: 07/30/1960 (61 y.o. Raymond [name] Primary Care Provider: [name] Other Clinician: Referring Provider: Treating Provider/Extender: Lynden Ang in Treatment: 9 Diagnosis Coding ICD-10 Codes Code Description E11.621 Type 2 diabetes mellitus with foot ulcer L97.518 Non-pressure chronic ulcer of other part of right foot with other specified severity E11.40 Type 2 diabetes mellitus with diabetic neuropathy, unspecified Facility Procedures CPT4 Code: [id number] Description: 29445 - APPLY TOTAL CONTACT LEG CAST ICD-10 Diagnosis Description E11.621 Type 2 diabetes mellitus with foot ulcer L97.518 Non-pressure chronic ulcer of other part of right foot with other specified sever Modifier: ity Quantity: 1 Physician Procedures : CPT4 Code Description Modifier [id number] - WC PHYS APPLY TOTAL CONTACT CAST ICD-10 Diagnosis Description E11.621 Type 2 diabetes mellitus with foot ulcer L97.518 Non-pressure chronic ulcer of other part of  right foot with other specified severity Quantity: 1 Electronic Signature(s) Signed: 05/28/2021 3:57:23 PM By: Linton Ham MD Entered By: Linton Ham on 05/28/2021 08:30:22

## 2021-05-31 ENCOUNTER — Telehealth: Payer: Self-pay | Admitting: Podiatry

## 2021-05-31 NOTE — Telephone Encounter (Signed)
Pt called and left message stating he called a couple of days ago to see if he qualified for diabetic shoes and has not heard back.  I returned call and explained I tried calling him back but someone picked up and would not speak..  But I sent pt a message in my chart with the cpt codes for him to call his insurance and check coverage.

## 2021-06-04 ENCOUNTER — Telehealth: Payer: Self-pay | Admitting: Cardiovascular Disease

## 2021-06-04 ENCOUNTER — Encounter (HOSPITAL_BASED_OUTPATIENT_CLINIC_OR_DEPARTMENT_OTHER): Payer: BC Managed Care – PPO | Admitting: Internal Medicine

## 2021-06-04 ENCOUNTER — Ambulatory Visit: Payer: BC Managed Care – PPO | Admitting: Cardiovascular Disease

## 2021-06-04 ENCOUNTER — Other Ambulatory Visit: Payer: Self-pay

## 2021-06-04 DIAGNOSIS — E11621 Type 2 diabetes mellitus with foot ulcer: Secondary | ICD-10-CM | POA: Diagnosis not present

## 2021-06-04 NOTE — Progress Notes (Signed)
Raymond, [name] ([id number]) Visit Report for 06/04/2021 Arrival Information Details Patient Name: Date of Service: [name], [name] RO LD 06/04/2021 9:45 A M Medical Record Number: [id number] Patient Account Number: [id number] Date of Birth/Sex: Treating RN: 01/28/1961 (61 y.o. Raymond [name], Raymond [name] Primary Care Dannica Bickham: [name] Other Clinician: Referring Ala Capri: Treating Erla Bacchi/Extender: Lynden Ang in Treatment: 10 Visit Information History Since Last Visit Added or deleted any medications: No Patient Arrived: Ambulatory Any new allergies or adverse reactions: No Arrival Time: 09:17 Had a fall or experienced change in No Accompanied By: self activities of daily living that may affect Transfer Assistance: None risk of falls: Patient Identification Verified: Yes Signs or symptoms of abuse/neglect since last visito No Secondary Verification Process Completed: Yes Hospitalized since last visit: No Patient Requires Transmission-Based Precautions: No Implantable device outside of the clinic excluding No Patient Has Alerts: No cellular tissue based products placed in the center since last visit: Has Dressing in Place as Prescribed: Yes Pain Present Now: No Electronic Signature(s) Signed: 06/04/2021 4:42:21 PM By: Rhae Hammock RN Entered By: Rhae Hammock on 06/04/2021 09:17:37 -------------------------------------------------------------------------------- Encounter Discharge Information Details Patient Name: Date of Service: Raymond [name], [name] 06/04/2021 9:45 A M Medical Record Number: [id number] Patient Account Number: [id number] Date of Birth/Sex: Treating RN: November 11, 1960 (61 y.o. Raymond [name], Raymond [name] Primary Care Davonna Ertl: [name] Other Clinician: Referring Kendrell Lottman: Treating Tex Conroy/Extender: Lynden Ang in Treatment: 10 Encounter Discharge Information Items Post Procedure Vitals Discharge Condition:  Stable Temperature (F): 97.4 Ambulatory Status: Ambulatory Pulse (bpm): 74 Discharge Destination: Home Respiratory Rate (breaths/min): 17 Transportation: Private Auto Blood Pressure (mmHg): 147/74 Accompanied By: self Schedule Follow-up Appointment: Yes Clinical Summary of Care: Patient Declined Electronic Signature(s) Signed: 06/04/2021 4:42:21 PM By: Rhae Hammock RN Entered By: Rhae Hammock on 06/04/2021 11:07:04 -------------------------------------------------------------------------------- Lower Extremity Assessment Details Patient Name: Date of Service: Raymond [name], [name] 06/04/2021 9:45 A M Medical Record Number: [id number] Patient Account Number: [id number] Date of Birth/Sex: Treating RN: Sep 20, 1960 (61 y.o. Raymond [name], Raymond [name] Primary Care Lilybelle Mayeda: [name] Other Clinician: Referring Tuck Dulworth: Treating Laniece Hornbaker/Extender: Lynden Ang in Treatment: 10 Edema Assessment Assessed: Shirlyn Goltz: No] Patrice Paradise: Yes] Edema: [Left: Ye] [Right: s] Calf Left: Right: Point of Measurement: 35 cm From Medial Instep 38 cm Ankle Left: Right: Point of Measurement: 12 cm From Medial Instep 23 cm Vascular Assessment Pulses: Dorsalis Pedis Palpable: [Right:Yes] Posterior Tibial Palpable: [Right:Yes] Electronic Signature(s) Signed: 06/04/2021 4:42:21 PM By: Rhae Hammock RN Entered By: Rhae Hammock on 06/04/2021 09:34:07 -------------------------------------------------------------------------------- Multi Wound Chart Details Patient Name: Date of Service: Raymond [name], [name] 06/04/2021 9:45 A M Medical Record Number: [id number] Patient Account Number: [id number] Date of Birth/Sex: Treating RN: 1961/05/06 (61 y.o. [name] Primary Care Nathaly Dawkins: [name] Other Clinician: Referring Syaire Saber: Treating Jaymien Landin/Extender: Lynden Ang in Treatment: 10 Vital Signs Height(in): 40 Capillary Blood  Glucose(mg/dl): 230 Weight(lbs): 224 Pulse(bpm): 53 Body Mass Index(BMI): 34 Blood Pressure(mmHg): 192/81 Temperature(F): 98.7 Respiratory Rate(breaths/min): 17 Photos: [2:Right Metatarsal head first] [N/A:N/A N/A] Wound Location: [2:Blister] [N/A:N/A] Wounding Event: [2:Diabetic Wound/Ulcer of the Lower] [N/A:N/A] Primary Etiology: [2:Extremity Cataracts, Hypertension, Peripheral N/A] Comorbid History: [2:Arterial Disease, Type II Diabetes, Osteomyelitis, Neuropathy 05/27/2019] [N/A:N/A] Date Acquired: [2:10] [N/A:N/A] Weeks of Treatment: [2:Open] [N/A:N/A] Wound Status: [2:0x0x0] [N/A:N/A] Measurements L x W x D (cm) [2:0] [N/A:N/A] A (cm) : rea [2:0] [N/A:N/A] Volume (cm) : [2:100.00%] [N/A:N/A] % Reduction in A [2:rea: 100.00%] [N/A:N/A] % Reduction in Volume: [2:Grade 2] [N/A:N/A]  Classification: [2:Medium] [N/A:N/A] Exudate A mount: [2:Serosanguineous] [N/A:N/A] Exudate Type: [2:red, brown] [N/A:N/A] Exudate Color: [2:Well defined, not attached] [N/A:N/A] Wound Margin: [2:Large (67-100%)] [N/A:N/A] Granulation A mount: [2:Pink, Pale] [N/A:N/A] Granulation Quality: [2:None Present (0%)] [N/A:N/A] Necrotic A mount: [2:Fat Layer (Subcutaneous Tissue): Yes N/A] Exposed Structures: [2:Fascia: No Tendon: No Muscle: No Joint: No Bone: No Medium (34-66%)] [N/A:N/A] Epithelialization: [2:Debridement - Selective/Open Wound N/A] Debridement: Pre-procedure Verification/Time Out 09:50 [N/A:N/A] Taken: [2:Lidocaine] [N/A:N/A] Pain Control: [2:Callus] [N/A:N/A] Tissue Debrided: [2:Skin/Epidermis] [N/A:N/A] Level: [2:0.04] [N/A:N/A] Debridement A (sq cm): [2:rea Curette] [N/A:N/A] Instrument: [2:Minimum] [N/A:N/A] Bleeding: [2:Pressure] [N/A:N/A] Hemostasis A chieved: [2:0] [N/A:N/A] Procedural Pain: [2:0] [N/A:N/A] Post Procedural Pain: [2:Procedure was tolerated well] [N/A:N/A] Debridement Treatment Response: [2:0.2x0.2x0.2] [N/A:N/A] Post Debridement Measurements L x W x D  (cm) [2:0.006] [N/A:N/A] Post Debridement Volume: (cm) [2:Debridement] [N/A:N/A] Procedures Performed: [2:T Contact Cast otal] Treatment Notes Electronic Signature(s) Signed: 06/04/2021 4:28:08 PM By: Linton Ham MD Signed: 06/04/2021 5:00:37 PM By: Levan Hurst RN, BSN Entered By: Linton Ham on 06/04/2021 10:36:13 -------------------------------------------------------------------------------- Multi-Disciplinary Care Plan Details Patient Name: Date of Service: [facility], [name] 06/04/2021 9:45 A M Medical Record Number: [id number] Patient Account Number: [id number] Date of Birth/Sex: Treating RN: 04/22/1961 (61 y.o. Raymond [name], Raymond [name] Primary Care : [name] Other Clinician: Referring : Treating /Extender: Lynden Ang in Treatment: [place] reviewed with physician Active Inactive Nutrition Nursing Diagnoses: Impaired glucose control: actual or potential Potential for alteratiion in Nutrition/Potential for imbalanced nutrition Goals: Patient/caregiver verbalizes understanding of need to maintain therapeutic glucose control per primary care physician Date Initiated: 03/26/2021 Date Inactivated: 04/23/2021 Target Resolution Date: 04/23/2021 Goal Status: Met Patient/caregiver will maintain therapeutic glucose control Date Initiated: 03/26/2021 Target Resolution Date: 06/18/2021 Goal Status: Active Interventions: Assess HgA1c results as ordered upon admission and as needed Provide education on elevated blood sugars and impact on wound healing Treatment Activities: Dietary management education, guidance and counseling : 03/26/2021 Special diet education : 03/26/2021 Notes: 05/22/21: Glucose control ongoing Wound/Skin Impairment Nursing Diagnoses: Impaired tissue integrity Knowledge deficit related to ulceration/compromised skin integrity Goals: Patient/caregiver will verbalize understanding  of skin care regimen Date Initiated: 03/26/2021 Target Resolution Date: 06/18/2021 Goal Status: Active Ulcer/skin breakdown will have a volume reduction of 30% by week 4 Date Initiated: 03/26/2021 Date Inactivated: 04/23/2021 Target Resolution Date: 04/23/2021 Goal Status: Met Interventions: Assess patient/caregiver ability to obtain necessary supplies Assess patient/caregiver ability to perform ulcer/skin care regimen upon admission and as needed Assess ulceration(s) every visit Provide education on ulcer and skin care Treatment Activities: Skin care regimen initiated : 03/26/2021 Topical wound management initiated : 03/26/2021 Notes: 05/22/21:Wound care ongoing Electronic Signature(s) Signed: 06/04/2021 4:42:21 PM By: Rhae Hammock RN Entered By: Rhae Hammock on 06/04/2021 11:05:40 -------------------------------------------------------------------------------- Pain Assessment Details Patient Name: Date of Service: Raymond [name], [name] 06/04/2021 9:45 A M Medical Record Number: [id number] Patient Account Number: [id number] Date of Birth/Sex: Treating RN: 1960-12-22 (61 y.o. Raymond [name], Raymond [name] Primary Care : [name] Other Clinician: Referring : Treating /Extender: Lynden Ang in Treatment: 10 Active Problems Location of Pain Severity and Description of Pain Patient Has Paino No Site Locations Pain Management and Medication Current Pain Management: Electronic Signature(s) Signed: 06/04/2021 4:42:21 PM By: Rhae Hammock RN Entered By: Rhae Hammock on 06/04/2021 09:33:56 -------------------------------------------------------------------------------- Patient/Caregiver Education Details Patient Name: Date of Service: Raymond [name], [name] 1/10/2023andnbsp9:45 [place] Record Number: [id number] Patient Account Number: [id number] Date of Birth/Gender: Treating RN: 31-Oct-1960 (61 y.o. Raymond [name],  [place] Primary Care Physician: [name] Other Clinician: Referring Physician: Treating Physician/Extender: Lynden Ang in Treatment: 10 Education Assessment Education Provided To: Patient Education Topics Provided Elevated Blood Sugar/ Impact on Healing: Methods: Explain/Verbal Responses: State content correctly Wound/Skin Impairment: Methods: Explain/Verbal Responses: State content correctly Electronic Signature(s) Signed: 06/04/2021 4:42:21 PM By: Rhae Hammock RN Entered By: Rhae Hammock on 06/04/2021 11:05:59 -------------------------------------------------------------------------------- Wound Assessment Details Patient Name: Date of Service: Raymond [name], [name] 06/04/2021 9:45 A M Medical Record Number: [id number] Patient Account Number: [id number] Date of Birth/Sex: Treating RN: 01/25/1961 (61 y.o. [name] Primary Care : [name] Other Clinician: Referring : Treating /Extender: Lynden Ang in Treatment: 10 Wound Status Wound Number: 2 Primary Diabetic Wound/Ulcer of the Lower Extremity Etiology: Wound Location: Right Metatarsal head first Wound Open Wounding Event: Blister Status: Date Acquired: 05/27/2019 Comorbid Cataracts, Hypertension, Peripheral Arterial Disease, Type II Weeks Of Treatment: 10 History: Diabetes, Osteomyelitis, Neuropathy Clustered Wound: No Photos Wound Measurements Length: (cm) Width: (cm) Depth: (cm) Area: (cm) Volume: (cm) 0 % Reduction in Area: 100% 0 % Reduction in Volume: 100% 0 Epithelialization: Medium (34-66%) 0 0 Wound Description Classification: Grade 2 Wound Margin: Well defined, not attached Exudate Amount: Medium Exudate Type: Serosanguineous Exudate Color: red, brown Foul Odor After Cleansing: No Slough/Fibrino No Wound Bed Granulation Amount: Large (67-100%) Exposed Structure Granulation Quality: Pink,  Pale Fascia Exposed: No Necrotic Amount: None Present (0%) Fat Layer (Subcutaneous Tissue) Exposed: Yes Tendon Exposed: No Muscle Exposed: No Joint Exposed: No Bone Exposed: No Treatment Notes Wound #2 (Metatarsal head first) Wound Laterality: Right Cleanser Soap and Water Discharge Instruction: May shower and wash wound with dial antibacterial soap and water prior to dressing change. Wound Cleanser Discharge Instruction: Cleanse the wound with wound cleanser prior to applying a clean dressing using gauze sponges, not tissue or cotton balls. Peri-Wound Care Sween Lotion (Moisturizing lotion) Discharge Instruction: Apply moisturizing lotion to foot with dressing changes Topical Primary Dressing Hydrofera Blue Classic Foam, 2x2 in Discharge Instruction: Moisten with saline prior to applying to wound bed Secondary Dressing Woven Gauze Sponge, Non-Sterile 4x4 in Discharge Instruction: Apply over primary dressing as directed. Secured With [employer] Surgical T 2x10 (in/yd) ape Discharge Instruction: Secure with tape as directed. Compression Wrap Compression Stockings Add-Ons Electronic Signature(s) Signed: 06/04/2021 3:01:20 PM By: Sandre Kitty Signed: 06/04/2021 5:00:37 PM By: Levan Hurst RN, BSN Entered By: Sandre Kitty on 06/04/2021 09:34:23 -------------------------------------------------------------------------------- Vitals Details Patient Name: Date of Service: Raymond [name], [name] 06/04/2021 9:45 A M Medical Record Number: [id number] Patient Account Number: [id number] Date of Birth/Sex: Treating RN: 05-Feb-1961 (61 y.o. Raymond [name], Raymond [name] Primary Care : [name] Other Clinician: Referring : Treating /Extender: Lynden Ang in Treatment: 10 Vital Signs Time Taken: 09:17 Temperature (F): 98.7 Height (in): 68 Pulse (bpm): 74 Weight (lbs): 224 Respiratory Rate (breaths/min): 17 Body Mass  Index (BMI): 34.1 Blood Pressure (mmHg): 192/81 Capillary Blood Glucose (mg/dl): 230 Reference Range: 80 - 120 mg / dl Electronic Signature(s) Signed: 06/04/2021 4:42:21 PM By: Rhae Hammock RN Entered By: Rhae Hammock on 06/04/2021 09:33:39

## 2021-06-04 NOTE — Telephone Encounter (Signed)
Patient states he will get cast off in the next week and would like to reschedule appointment.     I will forward to NL scheduling

## 2021-06-04 NOTE — Telephone Encounter (Signed)
Pt is not able to make his appt this morning.. states that he got lost. Pt also says that he still has his cast on and would like to know if it should be removed first prior to rescheduling

## 2021-06-04 NOTE — Progress Notes (Signed)
[name], [name] ([id number]) Visit Report for 06/04/2021 Debridement Details Patient Name: Date of Service: [facility], [name] RO LD 06/04/2021 9:45 A M Medical Record Number: [id number] Patient Account Number: [id number] Date of Birth/Sex: Treating RN: 01/30/1961 (61 y.o. [name] Primary Care Provider: [name] Other Clinician: Referring Provider: Treating Provider/Extender: Lynden Ang in Treatment: 10 Debridement Performed for Assessment: Wound #2 Right Metatarsal head first Performed By: Physician Ricard Dillon., MD Debridement Type: Debridement Severity of Tissue Pre Debridement: Fat layer exposed Level of Consciousness (Pre-procedure): Awake and Alert Pre-procedure Verification/Time Out Yes - 09:50 Taken: Start Time: 09:50 Pain Control: Lidocaine T Area Debrided (L x W): otal 0.2 (cm) x 0.2 (cm) = 0.04 (cm) Tissue and other material debrided: Viable, Non-Viable, Callus, Skin: Dermis , Skin: Epidermis Level: Skin/Epidermis Debridement Description: Selective/Open Wound Instrument: Curette Bleeding: Minimum Hemostasis Achieved: Pressure End Time: 09:50 Procedural Pain: 0 Post Procedural Pain: 0 Response to Treatment: Procedure was tolerated well Level of Consciousness (Post- Awake and Alert procedure): Post Debridement Measurements of Total Wound Length: (cm) 0.2 Width: (cm) 0.2 Depth: (cm) 0.2 Volume: (cm) 0.006 Character of Wound/Ulcer Post Debridement: Improved Severity of Tissue Post Debridement: Fat layer exposed Post Procedure Diagnosis Same as Pre-procedure Electronic Signature(s) Signed: 06/04/2021 4:28:08 PM By: Linton Ham MD Signed: 06/04/2021 5:00:37 PM By: Levan Hurst RN, BSN Entered By: Linton Ham on 06/04/2021 10:36:28 -------------------------------------------------------------------------------- HPI Details Patient Name: Date of Service: Raymond [name], [name] 06/04/2021 9:45 A M Medical Record  Number: [id number] Patient Account Number: [id number] Date of Birth/Sex: Treating RN: 05-Jul-1960 (61 y.o. [name] Primary Care Provider: [name] Other Clinician: Referring Provider: Treating Provider/Extender: Lynden Ang in Treatment: 10 History of Present Illness HPI Description: Admission 03/26/2021 [name] is a 61 year old male with a past medical history of uncontrolled type 2 diabetes on insulin with latest hemoglobin A1c of 15, previous left foot wound with osteomyelitis, peripheral arterial disease, status post kidney transplant, that presents to the clinic for a 1.5-year history of nonhealing wound to the plantar aspect of his right foot. He has been following with Dr. Earleen Newport, podiatry for this issue. He had surgical debridement with bone biopsy on 03/06/2021. Biopsy did not show evidence for osteomyelitis. At that time ACell was placed. He also follows with Dr. Megan Salon for antibiotic therapy and is currently on linezolid and Augmentin. He currently denies systemic signs of infection. He has been using Vaseline to the wound bed. He currently uses a front offloading shoe. He is following Dr. Loanne Drilling for his diabetes. 11/29; patient has not been here in 4 weeks. 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 He was given silver alginate and a surgical shoe and the wound is actually come in by 1 cm in width which is quite a big improvement. He has been evaluated by Dr. Gwenlyn Found because of a history of PAD. Last seen on 04/03/2021. Repeat noninvasive studies were done on 04/12/2021 showing a noncompressible ABI on the right but a TBI of 0.95 but monophasic waveforms. On the left also noncompressible with monophasic waveforms but with a TBI of 0.68. The normal TBI's bilaterally suggest adequate blood flow 12/13; right plantar first metatarsal head diabetic ulcer. He has been treated for osteomyelitis by infectious disease. He  has been evaluated by Dr. Gwenlyn Found. We have been using silver alginate under a total contact cast for the last week 12/6; the wound was dramatically better last week. Slightly smaller  this week but unfortunately the tissue was nonadherent. He has been using silver alginate with a surgical shoe 12/9; patient presents for first cast change. Has no issues or complaints today. 12/20; patient's wound is measuring smaller on the right first metatarsal head. Looks quite good and has no depth. We are using Hydrofera Blue under a total contact cast The patient comes in with very high BP. States he ran out of his clonidine patch 2 days ago because the pharmacy did not have it 12/28; improved measurements on the wound on the plantar right first metatarsal head. The wound looks quite good has no depth. We are using Hydrofera Blue under a total contact cast 2023 05/28/2021; patient is here in follow-up with Hydrofera Blue and total contact cast on his right first metatarsal head wound. He continues to make excellent progress. Wound is smaller looks healthy. 1/10; patient is not quite closed. Thick skin and callus around the small wound margin I remove this with a #3 curette we have been using Hydrofera Blue under a total contact cast which we will continue Electronic Signature(s) Signed: 06/04/2021 4:28:08 PM By: Linton Ham MD Entered By: Linton Ham on 06/04/2021 10:37:19 -------------------------------------------------------------------------------- Physical Exam Details Patient Name: Date of Service: Raymond [name], [name] 06/04/2021 9:45 A M Medical Record Number: [id number] Patient Account Number: [id number] Date of Birth/Sex: Treating RN: August 05, 1960 (61 y.o. [name] Primary Care Provider: [name] Other Clinician: Referring Provider: Treating Provider/Extender: Lynden Ang in Treatment: 10 Constitutional Patient is hypertensive.. Pulse regular and within  target range for patient.[name] Respirations regular, non-labored and within target range.. Temperature is normal and within the target range for the patient.[name] Appears in no distress. Notes Wound exam; small wound but surrounded by thick skin and callus I used a #3 curette to remove this and in the process increase the wound diameter. This does not probe to bone I am hopeful the debridement will give this a clean edge for final epithelialization but for now back and Hydrofera Blue and total contact cast Electronic Signature(s) Signed: 06/04/2021 4:28:08 PM By: Linton Ham MD Entered By: Linton Ham on 06/04/2021 10:38:06 -------------------------------------------------------------------------------- Physician Orders Details Patient Name: Date of Service: Raymond [name], [name] 06/04/2021 9:45 A M Medical Record Number: [id number] Patient Account Number: [id number] Date of Birth/Sex: Treating RN: Feb 04, 1961 (61 y.o. [name], [name] Primary Care Provider: [name] Other Clinician: Referring Provider: Treating Provider/Extender: Lynden Ang in Treatment: 10 Verbal / Phone Orders: No Diagnosis Coding ICD-10 Coding Code Description E11.621 Type 2 diabetes mellitus with foot ulcer L97.518 Non-pressure chronic ulcer of other part of right foot with other specified severity E11.40 Type 2 diabetes mellitus with diabetic neuropathy, unspecified Follow-up Appointments ppointment in 1 week. - Tuesday with Dr. Dellia Nims - ***60 minutes for cast*** Return A Bathing/ Shower/ Hygiene May shower and wash wound with soap and water. - with dressing changes Off-Loading Wound #2 Right Metatarsal head first Total Contact Cast to Right Lower Extremity Additional Orders / Instructions Follow Nutritious Diet - Monitor and control blood sugars for wound healing. Wound Treatment Wound #2 - Metatarsal head first Wound Laterality: Right Cleanser: Soap and Water 1 x Per  Week/7 Days Discharge Instructions: May shower and wash wound with dial antibacterial soap and water prior to dressing change. Cleanser: Wound Cleanser 1 x Per Week/7 Days Discharge Instructions: Cleanse the wound with wound cleanser prior to applying a clean dressing using gauze sponges, not tissue or cotton balls.  Peri-Wound Care: Sween Lotion (Moisturizing lotion) 1 x Per Week/7 Days Discharge Instructions: Apply moisturizing lotion to foot with dressing changes Prim Dressing: Hydrofera Blue Classic Foam, 2x2 in 1 x Per Week/7 Days ary Discharge Instructions: Moisten with saline prior to applying to wound bed Secondary Dressing: Woven Gauze Sponge, Non-Sterile 4x4 in (Generic) 1 x Per Week/7 Days Discharge Instructions: Apply over primary dressing as directed. Secured With: 79M Medipore Public affairs consultant Surgical T 2x10 (in/yd) 1 x Per Week/7 Days ape Discharge Instructions: Secure with tape as directed. Electronic Signature(s) Signed: 06/04/2021 4:28:08 PM By: Linton Ham MD Signed: 06/04/2021 4:42:21 PM By: Rhae Hammock RN Entered By: Rhae Hammock on 06/04/2021 10:37:14 -------------------------------------------------------------------------------- Problem List Details Patient Name: Date of Service: Raymond [name], [name] 06/04/2021 9:45 A M Medical Record Number: [id number] Patient Account Number: [id number] Date of Birth/Sex: Treating RN: 12-07-1960 (61 y.o. [name] Primary Care Provider: [name] Other Clinician: Referring Provider: Treating Provider/Extender: Lynden Ang in Treatment: 10 Active Problems ICD-10 Encounter Code Description Active Date MDM Diagnosis E11.621 Type 2 diabetes mellitus with foot ulcer 03/26/2021 No Yes L97.518 Non-pressure chronic ulcer of other part of right foot with other specified 04/23/2021 No Yes severity E11.40 Type 2 diabetes mellitus with diabetic neuropathy, unspecified 03/26/2021 No Yes Inactive  Problems Resolved Problems Electronic Signature(s) Signed: 06/04/2021 4:28:08 PM By: Linton Ham MD Entered By: Linton Ham on 06/04/2021 10:36:03 -------------------------------------------------------------------------------- Progress Note Details Patient Name: Date of Service: Raymond [name], [name] 06/04/2021 9:45 A M Medical Record Number: [id number] Patient Account Number: [id number] Date of Birth/Sex: Treating RN: 03-01-1961 (61 y.o. [name] Primary Care Provider: [name] Other Clinician: Referring Provider: Treating Provider/Extender: Lynden Ang in Treatment: 10 Subjective History of Present Illness (HPI) Admission 03/26/2021 Mr. Casmer Yepiz is a 61 year old male with a past medical history of uncontrolled type 2 diabetes on insulin with latest hemoglobin A1c of 15, previous left foot wound with osteomyelitis, peripheral arterial disease, status post kidney transplant, that presents to the clinic for a 1.5-year history of nonhealing wound to the plantar aspect of his right foot. He has been following with Dr. Earleen Newport, podiatry for this issue. He had surgical debridement with bone biopsy on 03/06/2021. Biopsy did not show evidence for osteomyelitis. At that time ACell was placed. He also follows with Dr. Megan Salon for antibiotic therapy and is currently on linezolid and Augmentin. He currently denies systemic signs of infection. He has been using Vaseline to the wound bed. He currently uses a front offloading shoe. He is following Dr. Loanne Drilling for his diabetes. 11/29; patient has not been here in 4 weeks. 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 He was given silver alginate and a surgical shoe and the wound is actually come in by 1 cm in width which is quite a big improvement. He has been evaluated by Dr. Gwenlyn Found because of a history of PAD. Last seen on 04/03/2021. Repeat noninvasive studies were done on 04/12/2021  showing a noncompressible ABI on the right but a TBI of 0.95 but monophasic waveforms. On the left also noncompressible with monophasic waveforms but with a TBI of 0.68. The normal TBI's bilaterally suggest adequate blood flow 12/13; right plantar first metatarsal head diabetic ulcer. He has been treated for osteomyelitis by infectious disease. He has been evaluated by Dr. Gwenlyn Found. We have been using silver alginate under a total contact cast for the last week 12/6; the wound was dramatically  better last week. Slightly smaller this week but unfortunately the tissue was nonadherent. He has been using silver alginate with a surgical shoe 12/9; patient presents for first cast change. Has no issues or complaints today. 12/20; patient's wound is measuring smaller on the right first metatarsal head. Looks quite good and has no depth. We are using Hydrofera Blue under a total contact cast The patient comes in with very high BP. States he ran out of his clonidine patch 2 days ago because the pharmacy did not have it 12/28; improved measurements on the wound on the plantar right first metatarsal head. The wound looks quite good has no depth. We are using Hydrofera Blue under a total contact cast 2023 05/28/2021; patient is here in follow-up with Hydrofera Blue and total contact cast on his right first metatarsal head wound. He continues to make excellent progress. Wound is smaller looks healthy. 1/10; patient is not quite closed. Thick skin and callus around the small wound margin I remove this with a #3 curette we have been using Hydrofera Blue under a total contact cast which we will continue Objective Constitutional Patient is hypertensive.. Pulse regular and within target range for patient.[name] Respirations regular, non-labored and within target range.. Temperature is normal and within the target range for the patient.[name] Appears in no distress. Vitals Time Taken: 9:17 AM, Height: 68 in, Weight: 224 lbs, BMI:  34.1, Temperature: 98.7 F, Pulse: 74 bpm, Respiratory Rate: 17 breaths/min, Blood Pressure: 192/81 mmHg, Capillary Blood Glucose: 230 mg/dl. General Notes: Wound exam; small wound but surrounded by thick skin and callus I used a #3 curette to remove this and in the process increase the wound diameter. This does not probe to bone I am hopeful the debridement will give this a clean edge for final epithelialization but for now back and Hydrofera Blue and total contact cast Integumentary (Hair, Skin) Wound #2 status is Open. Original cause of wound was Blister. The date acquired was: 05/27/2019. The wound has been in treatment 10 weeks. The wound is located on the Right Metatarsal head first. The wound measures 0cm length x 0cm width x 0cm depth; 0cm^2 area and 0cm^3 volume. There is Fat Layer (Subcutaneous Tissue) exposed. There is a medium amount of serosanguineous drainage noted. The wound margin is well defined and not attached to the wound base. There is large (67-100%) pink, pale granulation within the wound bed. There is no necrotic tissue within the wound bed. Assessment Active Problems ICD-10 Type 2 diabetes mellitus with foot ulcer Non-pressure chronic ulcer of other part of right foot with other specified severity Type 2 diabetes mellitus with diabetic neuropathy, unspecified Procedures Wound #2 Pre-procedure diagnosis of Wound #2 is a Diabetic Wound/Ulcer of the Lower Extremity located on the Right Metatarsal head first .Severity of Tissue Pre Debridement is: Fat layer exposed. There was a Selective/Open Wound Skin/Epidermis Debridement with a total area of 0.04 sq cm performed by Ricard Dillon., MD. With the following instrument(s): Curette to remove Viable and Non-Viable tissue/material. Material removed includes Callus, Skin: Dermis, and Skin: Epidermis after achieving pain control using Lidocaine. No specimens were taken. A time out was conducted at 09:50, prior to the start of  the procedure. A Minimum amount of bleeding was controlled with Pressure. The procedure was tolerated well with a pain level of 0 throughout and a pain level of 0 following the procedure. Post Debridement Measurements: 0.2cm length x 0.2cm width x 0.2cm depth; 0.006cm^3 volume. Character of Wound/Ulcer Post Debridement is  improved. Severity of Tissue Post Debridement is: Fat layer exposed. Post procedure Diagnosis Wound #2: Same as Pre-Procedure Pre-procedure diagnosis of Wound #2 is a Diabetic Wound/Ulcer of the Lower Extremity located on the Right Metatarsal head first . There was a T Contact otal Cast Procedure by Ricard Dillon., MD. Post procedure Diagnosis Wound #2: Same as Pre-Procedure Plan Follow-up Appointments: Return Appointment in 1 week. - Tuesday with Dr. Dellia Nims - ***60 minutes for cast*** Bathing/ Shower/ Hygiene: May shower and wash wound with soap and water. - with dressing changes Off-Loading: Wound #2 Right Metatarsal head first: T Contact Cast to Right Lower Extremity otal Additional Orders / Instructions: Follow Nutritious Diet - Monitor and control blood sugars for wound healing. WOUND #2: - Metatarsal head first Wound Laterality: Right Cleanser: Soap and Water 1 x Per Week/7 Days Discharge Instructions: May shower and wash wound with dial antibacterial soap and water prior to dressing change. Cleanser: Wound Cleanser 1 x Per Week/7 Days Discharge Instructions: Cleanse the wound with wound cleanser prior to applying a clean dressing using gauze sponges, not tissue or cotton balls. Peri-Wound Care: Sween Lotion (Moisturizing lotion) 1 x Per Week/7 Days Discharge Instructions: Apply moisturizing lotion to foot with dressing changes Prim Dressing: Hydrofera Blue Classic Foam, 2x2 in 1 x Per Week/7 Days ary Discharge Instructions: Moisten with saline prior to applying to wound bed Secondary Dressing: Woven Gauze Sponge, Non-Sterile 4x4 in (Generic) 1 x Per Week/7  Days Discharge Instructions: Apply over primary dressing as directed. Secured With: 54M Medipore Public affairs consultant Surgical T 2x10 (in/yd) 1 x Per Week/7 Days ape Discharge Instructions: Secure with tape as directed. 1. Still Hydrofera Blue under total contact cast 2. He has a shoe with an insole and callus pads ready for the eventuality that this will close over. Electronic Signature(s) Signed: 06/04/2021 4:28:08 PM By: Linton Ham MD Entered By: Linton Ham on 06/04/2021 10:38:45 -------------------------------------------------------------------------------- Total Contact Cast Details Patient Name: Date of Service: Raymond [name], [name] 06/04/2021 9:45 A M Medical Record Number: [id number] Patient Account Number: [id number] Date of Birth/Sex: Treating RN: [birth year] (61 y.o. [name] Primary Care Provider: [name] Other Clinician: Referring Provider: Treating Provider/Extender: Lynden Ang in Treatment: 37 T Contact Cast Applied for Wound Assessment: otal Wound #2 Right Metatarsal head first Performed By: Physician Ricard Dillon., MD Post Procedure Diagnosis Same as Pre-procedure Electronic Signature(s) Signed: 06/04/2021 4:28:08 PM By: Linton Ham MD Entered By: Linton Ham on 06/04/2021 10:36:41 -------------------------------------------------------------------------------- SuperBill Details Patient Name: Date of Service: Raymond [name], [name] 06/04/2021 Medical Record Number: [id number] Patient Account Number: [id number] Date of Birth/Sex: Treating RN: [birth year] (61 y.o. [name] Primary Care Provider: [name] Other Clinician: Referring Provider: Treating Provider/Extender: Lynden Ang in Treatment: 10 Diagnosis Coding ICD-10 Codes Code Description E11.621 Type 2 diabetes mellitus with foot ulcer L97.518 Non-pressure chronic ulcer of other part of right foot with other specified  severity E11.40 Type 2 diabetes mellitus with diabetic neuropathy, unspecified Facility Procedures CPT4 Code: [id number] Description: [phone] - DEBRIDE WOUND 1ST 20 SQ CM OR < ICD-10 Diagnosis Description L97.518 Non-pressure chronic ulcer of other part of right foot with other specified sever Modifier: ity Quantity: 1 Physician Procedures : CPT4 Code Description Modifier [id number] - WC PHYS DEBR WO ANESTH 20 SQ CM ICD-10 Diagnosis Description L97.518 Non-pressure chronic ulcer of other part of right foot with other specified severity Quantity: 1 Electronic Signature(s) Signed: 06/04/2021 4:28:08 PM By: Dellia Nims,  Legrand Como MD Entered By: Linton Ham on 06/04/2021 10:38:59

## 2021-06-11 ENCOUNTER — Encounter (HOSPITAL_BASED_OUTPATIENT_CLINIC_OR_DEPARTMENT_OTHER): Payer: BC Managed Care – PPO | Admitting: Internal Medicine

## 2021-06-11 ENCOUNTER — Other Ambulatory Visit: Payer: Self-pay

## 2021-06-11 DIAGNOSIS — E11621 Type 2 diabetes mellitus with foot ulcer: Secondary | ICD-10-CM | POA: Diagnosis not present

## 2021-06-12 NOTE — Progress Notes (Signed)
[name], [name] ([id number]) Visit Report for 06/11/2021 HPI Details Patient Name: Date of Service: Raymond [name], [name] RO LD 06/11/2021 8:45 A M Medical Record Number: [id number] Patient Account Number: [id number] Date of Birth/Sex: Treating RN: 1960-06-11 (61 y.o. [name] Primary Care Provider: [name] Other Clinician: Referring Provider: Treating Provider/Extender: Lynden Ang in Treatment: 11 History of Present Illness HPI Description: Admission 03/26/2021 [name] is a 61 year old male with a past medical history of uncontrolled type 2 diabetes on insulin with latest hemoglobin A1c of 15, previous left foot wound with osteomyelitis, peripheral arterial disease, status post kidney transplant, that presents to the clinic for a 1.5-year history of nonhealing wound to the plantar aspect of his right foot. He has been following with Dr. Earleen Newport, podiatry for this issue. He had surgical debridement with bone biopsy on 03/06/2021. Biopsy did not show evidence for osteomyelitis. At that time ACell was placed. He also follows with Dr. Megan Salon for antibiotic therapy and is currently on linezolid and Augmentin. He currently denies systemic signs of infection. He has been using Vaseline to the wound bed. He currently uses a front offloading shoe. He is following Dr. Loanne Drilling for his diabetes. 11/29; patient has not been here in 4 weeks. 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 He was given silver alginate and a surgical shoe and the wound is actually come in by 1 cm in width which is quite a big improvement. He has been evaluated by Dr. Gwenlyn Found because of a history of PAD. Last seen on 04/03/2021. Repeat noninvasive studies were done on 04/12/2021 showing a noncompressible ABI on the right but a TBI of 0.95 but monophasic waveforms. On the left also noncompressible with monophasic waveforms but with a TBI of 0.68. The normal TBI's  bilaterally suggest adequate blood flow 12/13; right plantar first metatarsal head diabetic ulcer. He has been treated for osteomyelitis by infectious disease. He has been evaluated by Dr. Gwenlyn Found. We have been using silver alginate under a total contact cast for the last week 12/6; the wound was dramatically better last week. Slightly smaller this week but unfortunately the tissue was nonadherent. He has been using silver alginate with a surgical shoe 12/9; patient presents for first cast change. Has no issues or complaints today. 12/20; patient's wound is measuring smaller on the right first metatarsal head. Looks quite good and has no depth. We are using Hydrofera Blue under a total contact cast The patient comes in with very high BP. States he ran out of his clonidine patch 2 days ago because the pharmacy did not have it 12/28; improved measurements on the wound on the plantar right first metatarsal head. The wound looks quite good has no depth. We are using Hydrofera Blue under a total contact cast 2023 05/28/2021; patient is here in follow-up with Hydrofera Blue and total contact cast on his right first metatarsal head wound. He continues to make excellent progress. Wound is smaller looks healthy. 1/10; patient is not quite closed. Thick skin and callus around the small wound margin I remove this with a #3 curette we have been using Hydrofera Blue under a total contact cast which we will continue 1/17; comes in today with still some callus over the wound surface which I removed on the right first metatarsal head he has no open wound. He has his insoles for his shoes but did not bring a callus protector. We closed him with a total contact cast  and Hydrofera Blue. He had a bone biopsy which showed osteomyelitis which was done by podiatry before he came to clinic. He was treated with antibiotics as directed by Megan Salon of infectious disease. Everything is closed. Electronic Signature(s) Signed:  06/11/2021 4:48:32 PM By: Linton Ham MD Entered By: Linton Ham on 06/11/2021 08:43:52 -------------------------------------------------------------------------------- Physical Exam Details Patient Name: Date of Service: Raymond [name], [name] 06/11/2021 8:45 A M Medical Record Number: [id number] Patient Account Number: [id number] Date of Birth/Sex: Treating RN: December 17, 1960 (61 y.o. [name] Primary Care Provider: [name] Other Clinician: Referring Provider: Treating Provider/Extender: Lynden Ang in Treatment: 11 Notes Wound exam; everything is closed here. I removed some callus from the wound surface there is nothing open tissue looks really healthy. Electronic Signature(s) Signed: 06/11/2021 4:48:32 PM By: Linton Ham MD Entered By: Linton Ham on 06/11/2021 08:44:21 -------------------------------------------------------------------------------- Physician Orders Details Patient Name: Date of Service: Raymond [name], [name] 06/11/2021 8:45 A M Medical Record Number: [id number] Patient Account Number: [id number] Date of Birth/Sex: Treating RN: Feb 03, 1961 (61 y.o. [name] Primary Care Provider: [name] Other Clinician: Referring Provider: Treating Provider/Extender: Lynden Ang in Treatment: 11 Verbal / Phone Orders: No Diagnosis Coding ICD-10 Coding Code Description E11.621 Type 2 diabetes mellitus with foot ulcer L97.518 Non-pressure chronic ulcer of other part of right foot with other specified severity E11.40 Type 2 diabetes mellitus with diabetic neuropathy, unspecified Discharge From [facility] Services Discharge from [place] healed!! Off-Loading Other: - Keep right foot padded with foam for protection Additional Orders / Instructions Other: - Follow up with primary doctor regarding high blood pressure!! Electronic Signature(s) Signed: 06/11/2021 4:48:32 PM By: Linton Ham MD Signed: 06/12/2021 5:10:09 PM By: Levan Hurst RN, BSN Entered By: Levan Hurst on 06/11/2021 08:42:38 -------------------------------------------------------------------------------- Problem List Details Patient Name: Date of Service: [name], [name] 06/11/2021 8:45 A M Medical Record Number: [id number] Patient Account Number: [id number] Date of Birth/Sex: Treating RN: 1960/11/11 (61 y.o. [name] Primary Care Provider: [name] Other Clinician: Referring Provider: Treating Provider/Extender: Lynden Ang in Treatment: 11 Active Problems ICD-10 Encounter Code Description Active Date MDM Diagnosis E11.621 Type 2 diabetes mellitus with foot ulcer 03/26/2021 No Yes L97.518 Non-pressure chronic ulcer of other part of right foot with other specified 04/23/2021 No Yes severity E11.40 Type 2 diabetes mellitus with diabetic neuropathy, unspecified 03/26/2021 No Yes Inactive Problems Resolved Problems Electronic Signature(s) Signed: 06/11/2021 4:48:32 PM By: Linton Ham MD Entered By: Linton Ham on 06/11/2021 08:42:01 -------------------------------------------------------------------------------- Progress Note Details Patient Name: Date of Service: Raymond [name], [name] 06/11/2021 8:45 A M Medical Record Number: [id number] Patient Account Number: [id number] Date of Birth/Sex: Treating RN: 04/02/1961 (61 y.o. [name] Primary Care Provider: [name] Other Clinician: Referring Provider: Treating Provider/Extender: Lynden Ang in Treatment: 11 Subjective History of Present Illness (HPI) Admission 03/26/2021 Mr. Shan Valdes is a 61 year old male with a past medical history of uncontrolled type 2 diabetes on insulin with latest hemoglobin A1c of 15, previous left foot wound with osteomyelitis, peripheral arterial disease, status post kidney transplant, that presents to the clinic for a  1.5-year history of nonhealing wound to the plantar aspect of his right foot. He has been following with Dr. Earleen Newport, podiatry for this issue. He had surgical debridement with bone biopsy on 03/06/2021. Biopsy did not show evidence for osteomyelitis. At that time ACell was placed. He also follows with  Dr. Megan Salon for antibiotic therapy and is currently on linezolid and Augmentin. He currently denies systemic signs of infection. He has been using Vaseline to the wound bed. He currently uses a front offloading shoe. He is following Dr. Loanne Drilling for his diabetes. 11/29; patient has not been here in 4 weeks. 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 He was given silver alginate and a surgical shoe and the wound is actually come in by 1 cm in width which is quite a big improvement. He has been evaluated by Dr. Gwenlyn Found because of a history of PAD. Last seen on 04/03/2021. Repeat noninvasive studies were done on 04/12/2021 showing a noncompressible ABI on the right but a TBI of 0.95 but monophasic waveforms. On the left also noncompressible with monophasic waveforms but with a TBI of 0.68. The normal TBI's bilaterally suggest adequate blood flow 12/13; right plantar first metatarsal head diabetic ulcer. He has been treated for osteomyelitis by infectious disease. He has been evaluated by Dr. Gwenlyn Found. We have been using silver alginate under a total contact cast for the last week 12/6; the wound was dramatically better last week. Slightly smaller this week but unfortunately the tissue was nonadherent. He has been using silver alginate with a surgical shoe 12/9; patient presents for first cast change. Has no issues or complaints today. 12/20; patient's wound is measuring smaller on the right first metatarsal head. Looks quite good and has no depth. We are using Hydrofera Blue under a total contact cast The patient comes in with very high BP. States he ran out of his clonidine patch 2 days  ago because the pharmacy did not have it 12/28; improved measurements on the wound on the plantar right first metatarsal head. The wound looks quite good has no depth. We are using Hydrofera Blue under a total contact cast 2023 05/28/2021; patient is here in follow-up with Hydrofera Blue and total contact cast on his right first metatarsal head wound. He continues to make excellent progress. Wound is smaller looks healthy. 1/10; patient is not quite closed. Thick skin and callus around the small wound margin I remove this with a #3 curette we have been using Hydrofera Blue under a total contact cast which we will continue 1/17; comes in today with still some callus over the wound surface which I removed on the right first metatarsal head he has no open wound. He has his insoles for his shoes but did not bring a callus protector. We closed him with a total contact cast and Hydrofera Blue. He had a bone biopsy which showed osteomyelitis which was done by podiatry before he came to clinic. He was treated with antibiotics as directed by Megan Salon of infectious disease. Everything is closed. Objective Constitutional Vitals Time Taken: 8:22 AM, Height: 68 in, Weight: 224 lbs, BMI: 34.1, Temperature: 98.1 F, Pulse: 72 bpm, Respiratory Rate: 16 breaths/min, Blood Pressure: 215/122 mmHg. General Notes: Dr. Dellia Nims notified of blood pressure reading. Integumentary (Hair, Skin) Wound #2 status is Open. Original cause of wound was Blister. The date acquired was: 05/27/2019. The wound has been in treatment 11 weeks. The wound is located on the Right Metatarsal head first. The wound measures 0cm length x 0cm width x 0cm depth; 0cm^2 area and 0cm^3 volume. There is no tunneling or undermining noted. There is a none present amount of drainage noted. The wound margin is well defined and not attached to the wound base. There is no granulation within the wound bed.  There is no necrotic tissue within the wound  bed. Assessment Active Problems ICD-10 Type 2 diabetes mellitus with foot ulcer Non-pressure chronic ulcer of other part of right foot with other specified severity Type 2 diabetes mellitus with diabetic neuropathy, unspecified Plan Discharge From [facility] Services: Discharge from [place] healed!! Off-Loading: Other: - Keep right foot padded with foam for protection Additional Orders / Instructions: Other: - Follow up with primary doctor regarding high blood pressure!! 1. The patient can be discharged from the clinic. He certainly did not need a total contact cast replaced 2. I have advised him to keep this area padded. He has insoles for his shoes but did not bring a foam or callus protector. 3. I have talked to him about his underlying osteomyelitis 4He has very high blood pressure in our clinic. I told him to go home by a new blood pressure cuff as his is old and start measuring it at home. He has a kidney transplant. I have told him he may need to go to his nephrologist. He is only on a clonidine transdermal patch which I have never really understood Electronic Signature(s) Signed: 06/11/2021 4:48:32 PM By: Linton Ham MD Entered By: Linton Ham on 06/11/2021 08:46:53 -------------------------------------------------------------------------------- SuperBill Details Patient Name: Date of Service: Raymond [name], [name] 06/11/2021 Medical Record Number: [id number] Patient Account Number: [id number] Date of Birth/Sex: Treating RN: 09/12/1960 (61 y.o. [name] Primary Care Provider: [name] Other Clinician: Referring Provider: Treating Provider/Extender: Lynden Ang in Treatment: 11 Diagnosis Coding ICD-10 Codes Code Description E11.621 Type 2 diabetes mellitus with foot ulcer L97.518 Non-pressure chronic ulcer of other part of right foot with other specified severity E11.40 Type 2 diabetes mellitus with diabetic  neuropathy, unspecified Facility Procedures CPT4 Code: [id number] Description: 99213 - WOUND CARE VISIT-LEV 3 EST PT Modifier: Quantity: 1 Physician Procedures : CPT4 Code Description Modifier [id number] - WC PHYS LEVEL 3 - EST PT ICD-10 Diagnosis Description E11.621 Type 2 diabetes mellitus with foot ulcer L97.518 Non-pressure chronic ulcer of other part of right foot with other specified severity E11.40  Type 2 diabetes mellitus with diabetic neuropathy, unspecified Quantity: 1 Electronic Signature(s) Signed: 06/12/2021 3:43:46 PM By: Linton Ham MD Signed: 06/12/2021 5:10:09 PM By: Levan Hurst RN, BSN Previous Signature: 06/11/2021 4:48:32 PM Version By: Linton Ham MD Entered By: Levan Hurst on 06/11/2021 17:53:50

## 2021-06-12 NOTE — Progress Notes (Signed)
KALI, AMBLER ([id number]) Visit Report for 06/11/2021 Arrival Information Details Patient Name: Date of Service: [place], [name] RO LD 06/11/2021 8:45 A M Medical Record Number: [id number] Patient Account Number: [id number] Date of Birth/Sex: Treating RN: [birth year] (61 y.o. [name] Primary Care Sadonna Kotara: [name] Other Clinician: Referring Kollen Armenti: Treating Vallen Calabrese/Extender: Lynden Ang in Treatment: 11 Visit Information History Since Last Visit Added or deleted any medications: No Patient Arrived: Ambulatory Any new allergies or adverse reactions: No Arrival Time: 08:22 Had a fall or experienced change in No Accompanied By: alone activities of daily living that may affect Transfer Assistance: None risk of falls: Patient Identification Verified: Yes Signs or symptoms of abuse/neglect since last visito No Secondary Verification Process Completed: Yes Hospitalized since last visit: No Patient Requires Transmission-Based Precautions: No Implantable device outside of the clinic excluding No Patient Has Alerts: No cellular tissue based products placed in the center since last visit: Has Dressing in Place as Prescribed: Yes Has Footwear/Offloading in Place as Prescribed: Yes Right: T Contact Cast otal Pain Present Now: No Electronic Signature(s) Signed: 06/12/2021 5:10:09 PM By: Levan Hurst RN, BSN Entered By: Levan Hurst on 06/11/2021 08:23:04 -------------------------------------------------------------------------------- Clinic Level of Care Assessment Details Patient Name: Date of Service: [facility] MB, [name] 06/11/2021 8:45 A M Medical Record Number: [id number] Patient Account Number: [id number] Date of Birth/Sex: Treating RN: 06/19/1960 (61 y.o. [name] Primary Care Kaelyn Nauta: [name] Other Clinician: Referring Ondine Gemme: Treating Jurney Overacker/Extender: Lynden Ang in Treatment: 11 Clinic  Level of Care Assessment Items TOOL 4 Quantity Score X- 1 0 Use when only an EandM is performed on FOLLOW-UP visit ASSESSMENTS - Nursing Assessment / Reassessment X- 1 10 Reassessment of Co-morbidities (includes updates in patient status) X- 1 5 Reassessment of Adherence to Treatment Plan ASSESSMENTS - Wound and Skin A ssessment / Reassessment X - Simple Wound Assessment / Reassessment - one wound 1 5 []  - 0 Complex Wound Assessment / Reassessment - multiple wounds []  - 0 Dermatologic / Skin Assessment (not related to wound area) ASSESSMENTS - Focused Assessment []  - 0 Circumferential Edema Measurements - multi extremities []  - 0 Nutritional Assessment / Counseling / Intervention X- 1 5 Lower Extremity Assessment (monofilament, tuning fork, pulses) []  - 0 Peripheral Arterial Disease Assessment (using hand held doppler) ASSESSMENTS - Ostomy and/or Continence Assessment and Care []  - 0 Incontinence Assessment and Management []  - 0 Ostomy Care Assessment and Management (repouching, etc.) PROCESS - Coordination of Care X - Simple Patient / Family Education for ongoing care 1 15 []  - 0 Complex (extensive) Patient / Family Education for ongoing care X- 1 10 Staff obtains Programmer, systems, Records, T Results / Process Orders est []  - 0 Staff telephones HHA, Nursing Homes / Clarify orders / etc []  - 0 Routine Transfer to another Facility (non-emergent condition) []  - 0 Routine Hospital Admission (non-emergent condition) []  - 0 New Admissions / Biomedical engineer / Ordering NPWT Apligraf, etc. , []  - 0 Emergency Hospital Admission (emergent condition) X- 1 10 Simple Discharge Coordination []  - 0 Complex (extensive) Discharge Coordination PROCESS - Special Needs []  - 0 Pediatric / Minor Patient Management []  - 0 Isolation Patient Management []  - 0 Hearing / Language / Visual special needs []  - 0 Assessment of Community assistance (transportation, D/C planning, etc.) []   - 0 Additional assistance / Altered mentation []  - 0 Support Surface(s) Assessment (bed, cushion, seat, etc.) INTERVENTIONS - Wound Cleansing / Measurement X - Simple Wound  Cleansing - one wound 1 5 []  - 0 Complex Wound Cleansing - multiple wounds X- 1 5 Wound Imaging (photographs - any number of wounds) []  - 0 Wound Tracing (instead of photographs) X- 1 5 Simple Wound Measurement - one wound []  - 0 Complex Wound Measurement - multiple wounds INTERVENTIONS - Wound Dressings []  - 0 Small Wound Dressing one or multiple wounds []  - 0 Medium Wound Dressing one or multiple wounds []  - 0 Large Wound Dressing one or multiple wounds []  - 0 Application of Medications - topical []  - 0 Application of Medications - injection INTERVENTIONS - Miscellaneous []  - 0 External ear exam []  - 0 Specimen Collection (cultures, biopsies, blood, body fluids, etc.) []  - 0 Specimen(s) / Culture(s) sent or taken to Lab for analysis []  - 0 Patient Transfer (multiple staff / Civil Service fast streamer / Similar devices) []  - 0 Simple Staple / Suture removal (25 or less) []  - 0 Complex Staple / Suture removal (26 or more) []  - 0 Hypo / Hyperglycemic Management (close monitor of Blood Glucose) []  - 0 Ankle / Brachial Index (ABI) - do not check if billed separately X- 1 5 Vital Signs Has the patient been seen at the hospital within the last three years: Yes Total Score: 80 Level Of Care: New/Established - Level 3 Electronic Signature(s) Signed: 06/12/2021 5:10:09 PM By: Levan Hurst RN, BSN Entered By: Levan Hurst on 06/11/2021 17:53:43 -------------------------------------------------------------------------------- Encounter Discharge Information Details Patient Name: Date of Service: [name], [name] 06/11/2021 8:45 A M Medical Record Number: [id number] Patient Account Number: [id number] Date of Birth/Sex: Treating RN: 09-Jun-1960 (61 y.o. [name] Primary Care Chauncy Mangiaracina: [name] Other  Clinician: Referring Meleny Tregoning: Treating Shandelle Borrelli/Extender: Lynden Ang in Treatment: 11 Encounter Discharge Information Items Discharge Condition: Stable Ambulatory Status: Ambulatory Discharge Destination: Home Transportation: Private Auto Accompanied By: alone Schedule Follow-up Appointment: Yes Clinical Summary of Care: Patient Declined Electronic Signature(s) Signed: 06/12/2021 5:10:09 PM By: Levan Hurst RN, BSN Entered By: Levan Hurst on 06/11/2021 17:54:24 -------------------------------------------------------------------------------- Lower Extremity Assessment Details Patient Name: Date of Service: [name], [name] 06/11/2021 8:45 A M Medical Record Number: [id number] Patient Account Number: [id number] Date of Birth/Sex: Treating RN: [birth year] (61 y.o. [name] Primary Care Safiya Girdler: [name] Other Clinician: Referring Justin Buechner: Treating Kenneith Stief/Extender: Lynden Ang in Treatment: 11 Edema Assessment Assessed: Shirlyn Goltz: No] Patrice Paradise: No] Edema: [Left: Ye] [Right: s] Calf Left: Right: Point of Measurement: 35 cm From Medial Instep 38 cm Ankle Left: Right: Point of Measurement: 12 cm From Medial Instep 23 cm Vascular Assessment Pulses: Dorsalis Pedis Palpable: [Right:Yes] Electronic Signature(s) Signed: 06/12/2021 5:10:09 PM By: Levan Hurst RN, BSN Entered By: Levan Hurst on 06/11/2021 08:35:23 -------------------------------------------------------------------------------- Multi Wound Chart Details Patient Name: Date of Service: [name], [name] 06/11/2021 8:45 A M Medical Record Number: [id number] Patient Account Number: [id number] Date of Birth/Sex: Treating RN: 19-Sep-1960 (61 y.o. [name] Primary Care Javoris Star: [name] Other Clinician: Referring Myeasha Ballowe: Treating Avaree Gilberti/Extender: Lynden Ang in Treatment: 11 Vital Signs Height(in):  20 Pulse(bpm): 28 Weight(lbs): 329 Blood Pressure(mmHg): 215/122 Body Mass Index(BMI): 34 Temperature(F): 98.1 Respiratory Rate(breaths/min): 16 Photos: [N/A:N/A] Right Metatarsal head first N/A N/A Wound Location: Blister N/A N/A Wounding Event: Diabetic Wound/Ulcer of the Lower N/A N/A Primary Etiology: Extremity Cataracts, Hypertension, Peripheral N/A N/A Comorbid History: Arterial Disease, Type II Diabetes, Osteomyelitis, Neuropathy 05/27/2019 N/A N/A Date Acquired: 11 N/A N/A Weeks of Treatment: Open N/A N/A  Wound Status: 0x0x0 N/A N/A Measurements L x W x D (cm) 0 N/A N/A A (cm) : rea 0 N/A N/A Volume (cm) : 100.00% N/A N/A % Reduction in A rea: 100.00% N/A N/A % Reduction in Volume: Grade 2 N/A N/A Classification: None Present N/A N/A Exudate A mount: Well defined, not attached N/A N/A Wound Margin: None Present (0%) N/A N/A Granulation A mount: None Present (0%) N/A N/A Necrotic A mount: Fascia: No N/A N/A Exposed Structures: Fat Layer (Subcutaneous Tissue): No Tendon: No Muscle: No Joint: No Bone: No Large (67-100%) N/A N/A Epithelialization: Treatment Notes Electronic Signature(s) Signed: 06/11/2021 4:48:32 PM By: Linton Ham MD Signed: 06/12/2021 5:10:09 PM By: Levan Hurst RN, BSN Entered By: Linton Ham on 06/11/2021 08:42:10 -------------------------------------------------------------------------------- Multi-Disciplinary Care Plan Details Patient Name: Date of Service: [facility] MB, [name] 06/11/2021 8:45 A M Medical Record Number: [id number] Patient Account Number: [id number] Date of Birth/Sex: Treating RN: Aug 06, 1960 (61 y.o. [name] Primary Care Lillian Ballester: [name] Other Clinician: Referring Theophil Thivierge: Treating Skyelar Halliday/Extender: Lynden Ang in Treatment: [place] reviewed with physician Active Inactive Electronic Signature(s) Signed: 06/12/2021 5:10:09 PM  By: Levan Hurst RN, BSN Entered By: Levan Hurst on 06/11/2021 17:53:16 -------------------------------------------------------------------------------- Pain Assessment Details Patient Name: Date of Service: [name], [name] 06/11/2021 8:45 A M Medical Record Number: [id number] Patient Account Number: [id number] Date of Birth/Sex: Treating RN: March 11, 1961 (61 y.o. [name] Primary Care Allyse Fregeau: [name] Other Clinician: Referring Shahan Starks: Treating Hewitt Garner/Extender: Lynden Ang in Treatment: 11 Active Problems Location of Pain Severity and Description of Pain Patient Has Paino No Site Locations Pain Management and Medication Current Pain Management: Electronic Signature(s) Signed: 06/12/2021 5:10:09 PM By: Levan Hurst RN, BSN Entered By: Levan Hurst on 06/11/2021 08:23:41 -------------------------------------------------------------------------------- Patient/Caregiver Education Details Patient Name: Date of Service: [name], [name] 1/17/2023andnbsp8:45 A M Medical Record Number: [id number] Patient Account Number: [id number] Date of Birth/Gender: Treating RN: 1960/08/25 (61 y.o. [name] Primary Care Physician: [name] Other Clinician: Referring Physician: Treating Physician/Extender: Lynden Ang in Treatment: 11 Education Assessment Education Provided To: Patient Education Topics Provided Wound/Skin Impairment: Methods: Explain/Verbal Responses: State content correctly [employer]) Signed: 06/12/2021 5:10:09 PM By: Levan Hurst RN, BSN Entered By: Levan Hurst on 06/11/2021 08:37:23 -------------------------------------------------------------------------------- Wound Assessment Details Patient Name: Date of Service: [name], [name] 06/11/2021 8:45 A M Medical Record Number: [id number] Patient Account Number: [id number] Date of Birth/Sex: Treating  RN: 1961/01/12 (61 y.o. [name] Primary Care Merrisa Skorupski: [name] Other Clinician: Referring Aashir Umholtz: Treating Zymire Turnbo/Extender: Lynden Ang in Treatment: 11 Wound Status Wound Number: 2 Primary Diabetic Wound/Ulcer of the Lower Extremity Etiology: Wound Location: Right Metatarsal head first Wound Open Wounding Event: Blister Status: Date Acquired: 05/27/2019 Comorbid Cataracts, Hypertension, Peripheral Arterial Disease, Type II Weeks Of Treatment: 11 History: Diabetes, Osteomyelitis, Neuropathy Clustered Wound: No Photos Wound Measurements Length: (cm) Width: (cm) Depth: (cm) Area: (cm) Volume: (cm) 0 % Reduction in Area: 100% 0 % Reduction in Volume: 100% 0 Epithelialization: Large (67-100%) 0 Tunneling: No 0 Undermining: No Wound Description Classification: Grade 2 Wound Margin: Well defined, not attached Exudate Amount: None Present Foul Odor After Cleansing: No Slough/Fibrino No Wound Bed Granulation Amount: None Present (0%) Exposed Structure Necrotic Amount: None Present (0%) Fascia Exposed: No Fat Layer (Subcutaneous Tissue) Exposed: No Tendon Exposed: No Muscle Exposed: No Joint Exposed: No Bone Exposed: No Electronic Signature(s) Signed: 06/11/2021 5:55:40  PM By: Dellie Catholic RN Entered By: Dellie Catholic on 06/11/2021 08:35:05 -------------------------------------------------------------------------------- Vitals Details Patient Name: Date of Service: [name], [name] 06/11/2021 8:45 A M Medical Record Number: [id number] Patient Account Number: [id number] Date of Birth/Sex: Treating RN: 12-12-1960 (61 y.o. [name] Primary Care Yosef Krogh: [name] Other Clinician: Referring Leiah Giannotti: Treating Aleem Elza/Extender: Lynden Ang in Treatment: 11 Vital Signs Time Taken: 08:22 Temperature (F): 98.1 Height (in): 68 Pulse (bpm): 72 Weight (lbs): 224 Respiratory Rate  (breaths/min): 16 Body Mass Index (BMI): 34.1 Blood Pressure (mmHg): 215/122 Reference Range: 80 - 120 mg / dl Notes Dr. Dellia Nims notified of blood pressure reading. Electronic Signature(s) Signed: 06/12/2021 5:10:09 PM By: Levan Hurst RN, BSN Entered By: Levan Hurst on 06/11/2021 08:40:43

## 2021-06-18 ENCOUNTER — Encounter: Payer: Self-pay | Admitting: Nurse Practitioner

## 2021-06-18 ENCOUNTER — Other Ambulatory Visit: Payer: Self-pay

## 2021-06-18 ENCOUNTER — Ambulatory Visit (INDEPENDENT_AMBULATORY_CARE_PROVIDER_SITE_OTHER): Payer: BC Managed Care – PPO | Admitting: Nurse Practitioner

## 2021-06-18 VITALS — BP 130/78 | HR 66 | Temp 98.1°F | Ht 68.0 in | Wt 228.0 lb

## 2021-06-18 DIAGNOSIS — E1122 Type 2 diabetes mellitus with diabetic chronic kidney disease: Secondary | ICD-10-CM | POA: Diagnosis not present

## 2021-06-18 DIAGNOSIS — K591 Functional diarrhea: Secondary | ICD-10-CM

## 2021-06-18 DIAGNOSIS — I129 Hypertensive chronic kidney disease with stage 1 through stage 4 chronic kidney disease, or unspecified chronic kidney disease: Secondary | ICD-10-CM

## 2021-06-18 DIAGNOSIS — N182 Chronic kidney disease, stage 2 (mild): Secondary | ICD-10-CM

## 2021-06-18 DIAGNOSIS — E782 Mixed hyperlipidemia: Secondary | ICD-10-CM | POA: Diagnosis not present

## 2021-06-18 DIAGNOSIS — L03119 Cellulitis of unspecified part of limb: Secondary | ICD-10-CM | POA: Diagnosis not present

## 2021-06-18 DIAGNOSIS — E6609 Other obesity due to excess calories: Secondary | ICD-10-CM

## 2021-06-18 DIAGNOSIS — Z6834 Body mass index (BMI) 34.0-34.9, adult: Secondary | ICD-10-CM

## 2021-06-18 DIAGNOSIS — Z794 Long term (current) use of insulin: Secondary | ICD-10-CM

## 2021-06-18 MED ORDER — CEFTRIAXONE SODIUM 500 MG IJ SOLR
500.0000 mg | Freq: Once | INTRAMUSCULAR | Status: AC
  Administered 2021-06-18: 10:00:00 500 mg via INTRAMUSCULAR

## 2021-06-18 NOTE — Patient Instructions (Signed)

## 2021-06-18 NOTE — Progress Notes (Signed)
I,Victoria T Hamilton,acting as a Education administrator for [name], FNP.,have documented all relevant documentation on the behalf of [name], FNP,as directed by  [name], FNP while in the presence of [name], [place].   This visit occurred during the SARS-CoV-2 public health emergency.  Safety protocols were in place, including screening questions prior to the visit, additional usage of staff PPE, and extensive cleaning of exam room while observing appropriate contact time as indicated for disinfecting solutions.  Subjective:     Patient ID: Raymond [name] , male    DOB: 1961/05/01 , 61 y.o.   MRN: [id number]   Chief Complaint  Patient presents with   Hypertension   Diabetes    HPI  Pt presents today for htn & bp f/u.  He is being followed by Endocrinology - added Trulicity.   He was seen at the Emergency room due to arm pain last night. Pt reports pain in left arm 9/10. Last night wife reports elbow appeared more swollen than usual. Noticed swelling & pain 3 days ago. Today will be the fourth day dealing with pain.  Pt describes pain as "throbbing".  He was given a Rx for colchicine and norco        Hypertension This is a chronic problem. The current episode started more than 1 year ago.  Diabetes He presents for his follow-up diabetic visit. He has type 2 diabetes mellitus. There are no hypoglycemic associated symptoms. There are no diabetic associated symptoms. There are no hypoglycemic complications. Risk factors for coronary artery disease include obesity, sedentary lifestyle, hypertension, male sex, dyslipidemia and diabetes mellitus. Current diabetic treatment includes oral agent (dual therapy). There is no change in his home blood glucose trend. (Blood sugars are averaging 160. )    Past Medical History:  Diagnosis Date   Chronic kidney disease    Awaiting transplant   Diabetes ([place])    Diabetes mellitus without complication ([place])    Hypertension    Renal disorder     Renal insufficiency      Family History  Problem Relation Age of Onset   Diabetes Mother    Heart disease Father    Colon cancer Neg Hx      Current Outpatient Medications:    blood glucose meter kit and supplies KIT, Dispense based on patient and insurance preference. Use up to four times daily as directed. (FOR ICD-9 250.00, 250.01)., Disp: 1 each, Rfl: 0   Blood Glucose Monitoring Suppl (ACCU-CHEK GUIDE ME) w/Device KIT, 1 Piece by Does not apply route as directed., Disp: 1 kit, Rfl: 0   cloNIDine (CATAPRES - DOSED IN MG/24 HR) 0.3 mg/24hr patch, Place 0.3 mg onto the skin once a week., Disp: , Rfl:    doxazosin (CARDURA) 4 MG tablet, Take 6 mg by mouth daily., Disp: , Rfl:    Dulaglutide (TRULICITY) 0.13 HY/3.8OI SOPN, Inject 0.75 mg into the skin once a week., Disp: 6 mL, Rfl: 3   glipiZIDE (GLUCOTROL XL) 5 MG 24 hr tablet, Take 5 mg by mouth daily. , Disp: , Rfl:    glucose blood (ACCU-CHEK GUIDE) test strip, Use as instructed 4 x daily. E11.65, Disp: 150 each, Rfl: 5   hydrALAZINE (APRESOLINE) 25 MG tablet, Take 25 mg by mouth 2 (two) times daily., Disp: , Rfl:    HYDROcodone-acetaminophen (NORCO/VICODIN) 5-325 MG tablet, Take 1 tablet by mouth every 6 (six) hours as needed., Disp: 10 tablet, Rfl: 0   Insulin NPH, Human,, Isophane, (NOVOLIN N FLEXPEN) 100  UNIT/ML Kiwkpen, Inject 100 Units into the skin every morning., Disp: 105 mL, Rfl: 3   Multiple Vitamin (MULTIVITAMIN) capsule, Take 1 capsule by mouth daily., Disp: , Rfl:    sodium bicarbonate 650 MG tablet, Take 1,300 mg by mouth 3 (three) times daily., Disp: , Rfl:    tacrolimus (PROGRAF) 1 MG capsule, Take 3 mg by mouth 2 (two) times daily. Take 3 capsules (82m) in AM & take 3 caps (369m in PM, Disp: , Rfl:    Vitamin D, Ergocalciferol, (DRISDOL) 50000 units CAPS capsule, Take 50,000 Units by mouth every 30 (thirty) days., Disp: , Rfl:    atorvastatin (LIPITOR) 10 MG tablet, Take 1 tablet (10 mg total) by mouth daily., Disp:  90 tablet, Rfl: 1   colchicine 0.6 MG tablet, Take 0.6 mg by mouth daily., Disp: , Rfl:    Continuous Blood Gluc Receiver (FREESTYLE LIBRE 2 READER) DEVI, As directed (Patient not taking: Reported on 06/18/2021), Disp: 1 each, Rfl: 0   Continuous Blood Gluc Sensor (FREESTYLE LIBRE 2 SENSOR) MISC, 1 Piece by Does not apply route every 14 (fourteen) days. (Patient not taking: Reported on 06/18/2021), Disp: 2 each, Rfl: 3   silver sulfADIAZINE (SILVADENE) 1 % cream, Apply 1 application topically daily. (Patient not taking: Reported on 05/15/2021), Disp: 50 g, Rfl: 0   triamcinolone (KENALOG) 0.025 % ointment, Apply 1 application topically 3 (three) times daily. (Patient not taking: Reported on 05/15/2021), Disp: , Rfl:    No Known Allergies   Review of Systems  Constitutional: Negative.   HENT: Negative.    Respiratory: Negative.    Cardiovascular: Negative.   Gastrointestinal: Negative.   Musculoskeletal:        Left elbow pain  Skin: Negative.   Allergic/Immunologic: Negative.   Neurological: Negative.   Psychiatric/Behavioral: Negative.      Today's Vitals   06/18/21 0924  BP: 130/78  Pulse: 66  Temp: 98.1 F (36.7 C)  Weight: 228 lb (103.4 kg)  Height: _0  (1.727 m)   Body mass index is 34.67 kg/m.  Wt Readings from Last 3 Encounters:  06/23/21 230 lb (104.3 kg)  06/18/21 228 lb (103.4 kg)  04/17/21 226 lb (102.5 kg)    Objective:  Physical Exam Constitutional:      General: He is not in acute distress.    Appearance: Normal appearance.  Cardiovascular:     Rate and Rhythm: Normal rate and regular rhythm.     Pulses: Normal pulses.     Heart sounds: Normal heart sounds. No murmur heard. Musculoskeletal:     Left elbow: Swelling and effusion present. Decreased range of motion. Tenderness present.  Neurological:     Mental Status: He is alert.        Assessment And Plan:     1. Benign hypertension with chronic kidney disease Comments: Blood pressure is fairly  controlled, continue current medications.  2. Type 2 diabetes mellitus with stage 2 chronic kidney disease, with long-term current use of insulin (HCGrayComments: Continue follow up with Endocrinology.   3. Mixed hyperlipidemia - Lipid panel; Future  4. Class 1 obesity due to excess calories without serious comorbidity with body mass index (BMI) of 34.0 to 34.9 in adult  He is encouraged to strive for BMI less than 30 to decrease cardiac risk. Advised to aim for at least 150 minutes of exercise per week.   5. 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  - Ambulatory referral to Gastroenterology  6. Cellulitis of elbow Comments: Will treat with Rocephin 500 mg IM, has swelling to left elbow and hot to touch.  - cefTRIAXone (ROCEPHIN) injection 500 mg    Patient was given opportunity to ask questions. Patient verbalized understanding of the plan and was able to repeat key elements of the plan. All questions were answered to their satisfaction.  [name], FNP   I, [name], FNP, have reviewed all documentation for this visit. The documentation on 06/18/21 for the exam, diagnosis, procedures, and orders are all accurate and complete.   IF YOU HAVE BEEN REFERRED TO A SPECIALIST, IT MAY TAKE 1-2 WEEKS TO SCHEDULE/PROCESS THE REFERRAL. IF YOU HAVE NOT HEARD FROM US/SPECIALIST IN TWO WEEKS, PLEASE GIVE [place] A CALL AT [phone] X [phone].   THE PATIENT IS ENCOURAGED TO PRACTICE SOCIAL DISTANCING DUE TO THE COVID-19 PANDEMIC.

## 2021-06-20 ENCOUNTER — Other Ambulatory Visit: Payer: Self-pay | Admitting: Nurse Practitioner

## 2021-06-20 DIAGNOSIS — M25522 Pain in left elbow: Secondary | ICD-10-CM

## 2021-06-20 DIAGNOSIS — L03119 Cellulitis of unspecified part of limb: Secondary | ICD-10-CM

## 2021-06-21 ENCOUNTER — Ambulatory Visit
Admission: RE | Admit: 2021-06-21 | Discharge: 2021-06-21 | Disposition: A | Discharge status: Home / Self Care | Payer: BC Managed Care – PPO | Source: Ambulatory Visit | Attending: Nurse Practitioner | Admitting: Nurse Practitioner

## 2021-06-21 ENCOUNTER — Other Ambulatory Visit: Payer: Self-pay

## 2021-06-21 DIAGNOSIS — L03119 Cellulitis of unspecified part of limb: Secondary | ICD-10-CM

## 2021-06-21 DIAGNOSIS — M25522 Pain in left elbow: Secondary | ICD-10-CM

## 2021-06-23 ENCOUNTER — Emergency Department: Payer: BC Managed Care – PPO

## 2021-06-23 ENCOUNTER — Inpatient Hospital Stay
Admission: EM | Admit: 2021-06-23 | Discharge: 2021-06-27 | DRG: 511 | Disposition: A | Discharge status: Home Health | Payer: BC Managed Care – PPO | Attending: Internal Medicine | Admitting: Internal Medicine

## 2021-06-23 ENCOUNTER — Other Ambulatory Visit: Payer: Self-pay

## 2021-06-23 ENCOUNTER — Encounter: Payer: Self-pay | Admitting: Nurse Practitioner

## 2021-06-23 DIAGNOSIS — I152 Hypertension secondary to endocrine disorders: Secondary | ICD-10-CM

## 2021-06-23 DIAGNOSIS — Z794 Long term (current) use of insulin: Secondary | ICD-10-CM

## 2021-06-23 DIAGNOSIS — M00222 Other streptococcal arthritis, left elbow: Principal | ICD-10-CM | POA: Diagnosis present

## 2021-06-23 DIAGNOSIS — Z20822 Contact with and (suspected) exposure to covid-19: Secondary | ICD-10-CM | POA: Diagnosis present

## 2021-06-23 DIAGNOSIS — I10 Essential (primary) hypertension: Secondary | ICD-10-CM | POA: Diagnosis present

## 2021-06-23 DIAGNOSIS — N1831 Chronic kidney disease, stage 3a: Secondary | ICD-10-CM | POA: Diagnosis present

## 2021-06-23 DIAGNOSIS — K219 Gastro-esophageal reflux disease without esophagitis: Secondary | ICD-10-CM | POA: Diagnosis present

## 2021-06-23 DIAGNOSIS — M009 Pyogenic arthritis, unspecified: Secondary | ICD-10-CM | POA: Diagnosis present

## 2021-06-23 DIAGNOSIS — Z7984 Long term (current) use of oral hypoglycemic drugs: Secondary | ICD-10-CM

## 2021-06-23 DIAGNOSIS — Z833 Family history of diabetes mellitus: Secondary | ICD-10-CM

## 2021-06-23 DIAGNOSIS — D631 Anemia in chronic kidney disease: Secondary | ICD-10-CM | POA: Diagnosis present

## 2021-06-23 DIAGNOSIS — M25522 Pain in left elbow: Secondary | ICD-10-CM

## 2021-06-23 DIAGNOSIS — G4730 Sleep apnea, unspecified: Secondary | ICD-10-CM | POA: Diagnosis present

## 2021-06-23 DIAGNOSIS — Z86718 Personal history of other venous thrombosis and embolism: Secondary | ICD-10-CM

## 2021-06-23 DIAGNOSIS — B951 Streptococcus, group B, as the cause of diseases classified elsewhere: Secondary | ICD-10-CM | POA: Diagnosis present

## 2021-06-23 DIAGNOSIS — E1122 Type 2 diabetes mellitus with diabetic chronic kidney disease: Secondary | ICD-10-CM

## 2021-06-23 DIAGNOSIS — E11649 Type 2 diabetes mellitus with hypoglycemia without coma: Secondary | ICD-10-CM | POA: Diagnosis not present

## 2021-06-23 DIAGNOSIS — E782 Mixed hyperlipidemia: Secondary | ICD-10-CM | POA: Diagnosis present

## 2021-06-23 DIAGNOSIS — I739 Peripheral vascular disease, unspecified: Secondary | ICD-10-CM | POA: Diagnosis present

## 2021-06-23 DIAGNOSIS — Z940 Kidney transplant status: Secondary | ICD-10-CM

## 2021-06-23 DIAGNOSIS — E1165 Type 2 diabetes mellitus with hyperglycemia: Secondary | ICD-10-CM | POA: Diagnosis present

## 2021-06-23 DIAGNOSIS — E119 Type 2 diabetes mellitus without complications: Secondary | ICD-10-CM

## 2021-06-23 DIAGNOSIS — M10022 Idiopathic gout, left elbow: Secondary | ICD-10-CM | POA: Diagnosis present

## 2021-06-23 DIAGNOSIS — Z7985 Long-term (current) use of injectable non-insulin antidiabetic drugs: Secondary | ICD-10-CM

## 2021-06-23 DIAGNOSIS — I129 Hypertensive chronic kidney disease with stage 1 through stage 4 chronic kidney disease, or unspecified chronic kidney disease: Secondary | ICD-10-CM | POA: Diagnosis present

## 2021-06-23 DIAGNOSIS — Z79899 Other long term (current) drug therapy: Secondary | ICD-10-CM

## 2021-06-23 DIAGNOSIS — Z8249 Family history of ischemic heart disease and other diseases of the circulatory system: Secondary | ICD-10-CM

## 2021-06-23 LAB — COMPREHENSIVE METABOLIC PANEL
ALT: 37 U/L (ref 0–44)
AST: 26 U/L (ref 15–41)
Albumin: 2.9 g/dL — ABNORMAL LOW (ref 3.5–5.0)
Alkaline Phosphatase: 119 U/L (ref 38–126)
Anion gap: 6 (ref 5–15)
BUN: 30 mg/dL — ABNORMAL HIGH (ref 6–20)
CO2: 23 mmol/L (ref 22–32)
Calcium: 9.4 mg/dL (ref 8.9–10.3)
Chloride: 111 mmol/L (ref 98–111)
Creatinine, Ser: 1.41 mg/dL — ABNORMAL HIGH (ref 0.61–1.24)
GFR, Estimated: 57 mL/min — ABNORMAL LOW (ref >60)
Glucose, Bld: 312 mg/dL — ABNORMAL HIGH (ref 70–99)
Potassium: 4.4 mmol/L (ref 3.5–5.1)
Sodium: 140 mmol/L (ref 135–145)
Total Bilirubin: 0.3 mg/dL (ref 0.3–1.2)
Total Protein: 7.7 g/dL (ref 6.5–8.1)

## 2021-06-23 LAB — CBC WITH DIFFERENTIAL/PLATELET
Abs Immature Granulocytes: 0.13 10*3/uL — ABNORMAL HIGH (ref 0.00–0.07)
Basophils Absolute: 0 10*3/uL (ref 0.0–0.1)
Basophils Relative: 1 %
Eosinophils Absolute: 0.2 10*3/uL (ref 0.0–0.5)
Eosinophils Relative: 3 %
HCT: 38.7 % — ABNORMAL LOW (ref 39.0–52.0)
Hemoglobin: 12 g/dL — ABNORMAL LOW (ref 13.0–17.0)
Immature Granulocytes: 2 %
Lymphocytes Relative: 22 %
Lymphs Abs: 1.6 10*3/uL (ref 0.7–4.0)
MCH: 25.2 pg — ABNORMAL LOW (ref 26.0–34.0)
MCHC: 31 g/dL (ref 30.0–36.0)
MCV: 81.1 fL (ref 80.0–100.0)
Monocytes Absolute: 0.7 10*3/uL (ref 0.1–1.0)
Monocytes Relative: 9 %
Neutro Abs: 4.6 10*3/uL (ref 1.7–7.7)
Neutrophils Relative %: 63 %
Platelets: 198 10*3/uL (ref 150–400)
RBC: 4.77 MIL/uL (ref 4.22–5.81)
RDW: 15.2 % (ref 11.5–15.5)
WBC: 7.2 10*3/uL (ref 4.0–10.5)
nRBC: 0 % (ref 0.0–0.2)

## 2021-06-23 MED ORDER — ONDANSETRON HCL 4 MG/2ML IJ SOLN
4.0000 mg | Freq: Once | INTRAMUSCULAR | Status: AC
  Administered 2021-06-23: 4 mg via INTRAVENOUS
  Filled 2021-06-23: qty 2

## 2021-06-23 MED ORDER — HYDROMORPHONE HCL 1 MG/ML IJ SOLN
1.0000 mg | Freq: Once | INTRAMUSCULAR | Status: AC
  Administered 2021-06-23: 1 mg via INTRAVENOUS
  Filled 2021-06-23: qty 1

## 2021-06-23 NOTE — ED Provider Notes (Signed)
[facility] Provider Note    Event Date/Time   First MD Initiated Contact with Patient 06/23/21 2302     (approximate)   History   left arm swelling   HPI  Raymond [name] is a 61 y.o. male right-hand-dominant with history of hypertension, diabetes, kidney transplant in 2016 who presents to the emergency department with left upper extremity pain and swelling.  Pain on going for over a week now.  No known injury.  Was seen in the emergency department at [facility] on 06/17/2021 and had an x-ray that showed a joint effusion.  He was thought to have gout and was started on colchicine and given Norco.  Reports he is not having any improvement in his symptoms.  Was then seen by his primary care doctor the next day and was given a dose of IM Rocephin for possible cellulitis but was not discharged with any antibiotics.  He states pain is not improving and now he has having worsening swelling.  Pain is mostly in the posterior elbow.  He does have an AV fistula in the left upper extremity that was placed around 2010 but states this has not been used for dialysis since he had his transplant in 2016.  He has had a previous history of a lower extremity DVT and is no longer on anticoagulation.  He denies any fevers or chills.  No chest pain or shortness of breath.   History provided by patient.    Past Medical History:  Diagnosis Date   Chronic kidney disease    Awaiting transplant   Diabetes ([place])    Diabetes mellitus without complication ([place])    Hypertension    Renal disorder    Renal insufficiency     Past Surgical History:  Procedure Laterality Date   BONE BIOPSY Right 03/06/2021   Procedure: BONE BIOPSY;  Surgeon: Trula Slade, DPM;  Location: [facility] ORS;  Service: Podiatry;  Laterality: Right;   CENTRAL VENOUS CATHETER INSERTION Right 01/20/2020   Procedure: INSERTION CENTRAL LINE ADULT; Tunneled central line;  Surgeon: Virl Cagey, MD;  Location: [facility]  ORS;  Service: General;  Laterality: Right;   COLONOSCOPY N/A 12/05/2014   [id number] external and internal hemorrhoid/mild diverticulosis/11 polyps removed   ESOPHAGOGASTRODUODENOSCOPY N/A 12/05/2014   NID:[id number] duodenitis   GRAFT APPLICATION Right 23/53/6144   Procedure: GRAFT APPLICATION;  Surgeon: Trula Slade, DPM;  Location: [facility];  Service: Podiatry;  Laterality: Right;   IR FLUORO GUIDE CV LINE RIGHT  03/01/2019   IR REMOVAL TUN CV CATH W/O FL  05/10/2019   IR REMOVAL TUN CV CATH W/O FL  02/29/2020   IR US GUIDE VASC ACCESS RIGHT  03/01/2019   KIDNEY TRANSPLANT  03/27/2015   Penile Pump Insertion     TEE WITHOUT CARDIOVERSION N/A 01/17/2020   Procedure: TRANSESOPHAGEAL ECHOCARDIOGRAM (TEE) WITH PROPOFOL;  Surgeon: Arnoldo Lenis, MD;  Location: [facility] END[facility] SUITE;  Service: Endoscopy;  Laterality: N/A;   WOUND DEBRIDEMENT Right 03/06/2021   Procedure: DEBRIDEMENT WOUND;  Surgeon: Trula Slade, DPM;  Location: [facility] ORS;  Service: Podiatry;  Laterality: Right;    MEDICATIONS:  Prior to Admission medications   Medication Sig Start Date End Date Taking? Authorizing Provider  blood glucose meter kit and supplies KIT Dispense based on patient and insurance preference. Use up to four times daily as directed. (FOR ICD-9 250.00, 250.01). 04/11/20  Yes [name], [name], MD  Blood Glucose Monitoring Suppl (ACCU-CHEK GUIDE ME) w/Device KIT  1 Piece by Does not apply route as directed. 02/18/18  Yes [name], [name], MD  cloNIDine (CATAPRES - DOSED IN MG/24 HR) 0.3 mg/24hr patch Place 0.3 mg onto the skin once a week.   Yes [provider]  colchicine 0.6 MG tablet Take 0.6 mg by mouth daily. 06/17/21  Yes [provider]  doxazosin (CARDURA) 4 MG tablet Take 6 mg by mouth daily. 12/18/15  Yes [provider]  Dulaglutide (TRULICITY) 1.61 WR/6.0AV SOPN Inject 0.75 mg into the skin once a week. 03/25/21  Yes Renato Shin, MD  glipiZIDE (GLUCOTROL XL)  5 MG 24 hr tablet Take 5 mg by mouth daily.    Yes [provider]  glucose blood (ACCU-CHEK GUIDE) test strip Use as instructed 4 x daily. E11.65 02/19/18  Yes [name], [name], MD  hydrALAZINE (APRESOLINE) 25 MG tablet Take 25 mg by mouth 2 (two) times daily.   Yes [provider]  HYDROcodone-acetaminophen (NORCO/VICODIN) 5-325 MG tablet Take 1 tablet by mouth every 6 (six) hours as needed. 03/06/21  Yes Trula Slade, DPM  Insulin NPH, Human,, Isophane, (NOVOLIN N FLEXPEN) 100 UNIT/ML Kiwkpen Inject 100 Units into the skin every morning. 03/05/21  Yes Renato Shin, MD  Multiple Vitamin (MULTIVITAMIN) capsule Take 1 capsule by mouth daily.   Yes [provider]  sodium bicarbonate 650 MG tablet Take 1,300 mg by mouth 3 (three) times daily. 11/02/18  Yes [provider]  tacrolimus (PROGRAF) 1 MG capsule Take 3 mg by mouth 2 (two) times daily. Take 3 capsules (40m) in AM & take 3 caps (358m in PM   Yes [provider]  Vitamin D, Ergocalciferol, (DRISDOL) 50000 units CAPS capsule Take 50,000 Units by mouth every 30 (thirty) days.   Yes [provider]  atorvastatin (LIPITOR) 10 MG tablet Take 1 tablet (10 mg total) by mouth daily. 03/14/21 06/12/21  MoMinette BrineFNP  Continuous Blood Gluc Receiver (FREESTYLE LIBRE 2 READER) DEVI As directed Patient not taking: Reported on 06/18/2021 04/24/20   NiCassandria AngerMD  Continuous Blood Gluc Sensor (FREESTYLE LIBRE 2 SENSOR) MISC 1 Piece by Does not apply route every 14 (fourteen) days. Patient not taking: Reported on 06/18/2021 04/24/20   NiCassandria AngerMD  silver sulfADIAZINE (SILVADENE) 1 % cream Apply 1 application topically daily. Patient not taking: Reported on 05/15/2021 01/31/21   WaTrula SladeDPM  triamcinolone (KENALOG) 0.025 % ointment Apply 1 application topically 3 (three) times daily. Patient not taking: Reported on 05/15/2021 01/07/20   [provider]     Physical Exam   Triage Vital Signs: ED Triage Vitals  Enc Vitals Group     BP 06/23/21 2059 (!) 133/98     Pulse Rate 06/23/21 2059 72     Resp 06/23/21 2059 16     Temp 06/23/21 2059 98.5 F (36.9 C)     Temp Source 06/23/21 2059 Oral     SpO2 06/23/21 2059 94 %     Weight 06/23/21 2100 230 lb (104.3 kg)     Height 06/23/21 2100 '5\' 8"'  (1.727 m)     Head Circumference --      Peak Flow --      Pain Score 06/23/21 2100 9     Pain Loc --      Pain Edu? --      Excl. in [place]--     Most recent vital signs: Vitals:   06/23/21 2059 06/24/21 0009  BP: (![name]  133/98 131/85  Pulse: 72 74  Resp: 16 17  Temp: 98.5 F (36.9 C)   SpO2: 94% 95%    CONSTITUTIONAL: Alert and oriented and responds appropriately to questions. Well-appearing; well-nourished HEAD: Normocephalic, atraumatic EYES: Conjunctivae clear, pupils appear equal, sclera nonicteric ENT: normal nose; moist mucous membranes NECK: Supple, normal ROM CARD: RRR; S1 and S2 appreciated; no murmurs, no clicks, no rubs, no gallops RESP: Normal chest excursion without splinting or tachypnea; breath sounds clear and equal bilaterally; no wheezes, no rhonchi, no rales, no hypoxia or respiratory distress, speaking full sentences ABD/GI: Normal bowel sounds; non-distended; soft, non-tender, no rebound, no guarding, no peritoneal signs BACK: The back appears normal EXT: Patient is quite tender to palpation over the posterior left elbow with associated soft tissue swelling diffusely over the left elbow especially over the olecranon bursa.  He has dependent swelling down his left forearm and hand.  I am able to Doppler 2+ radial, ulnar and brachial pulses.  He has normal bruit and thrill in the AV fistula.  It is not red, warm or tender over the fistula.  There is no bleeding or drainage.  He has pain with flexion and extension of the left elbow.  Normal range of motion in the shoulder, wrist and fingers.  No other tenderness noted in  the left arm. SKIN: Normal color for age and race; warm; no rash on exposed skin NEURO: Moves all extremities equally, normal speech PSYCH: The patient's mood and manner are appropriate.   ED Results / Procedures / Treatments   LABS: (all labs ordered are listed, but only abnormal results are displayed) Labs Reviewed  COMPREHENSIVE METABOLIC PANEL - Abnormal; Notable for the following components:      Result Value   Glucose, Bld 312 (*)    BUN 30 (*)    Creatinine, Ser 1.41 (*)    Albumin 2.9 (*)    GFR, Estimated 57 (*)    All other components within normal limits  CBC WITH DIFFERENTIAL/PLATELET - Abnormal; Notable for the following components:   Hemoglobin 12.0 (*)    HCT 38.7 (*)    MCH 25.2 (*)    Abs Immature Granulocytes 0.13 (*)    All other components within normal limits  BODY FLUID CULTURE W GRAM STAIN  GRAM STAIN  CULTURE, BLOOD (ROUTINE X 2)  CULTURE, BLOOD (ROUTINE X 2)  RESP PANEL BY RT-PCR (FLU A&B, COVID) ARPGX2  PROCALCITONIN  SYNOVIAL FLUID, CRYSTAL     EKG:   RADIOLOGY: My personal review and interpretation of imaging: X-ray of the left elbow shows anterior fat pad but no bony abnormality.  Venous Doppler shows a patent AV fistula and no DVT.  I have personally reviewed all radiology reports.   DG Elbow Complete Left  Result Date: 06/24/2021 CLINICAL DATA:  Left elbow pain and swelling. EXAM: LEFT ELBOW - COMPLETE 3+ VIEW COMPARISON:  Recent study 2 days ago 06/21/2021 FINDINGS: Circumferential soft tissue edema is again noted greatest posteriorly. There are positive fat pad signs again seen consistent with a joint effusion. There is no evidence of fractures or erosive arthropathy with mild narrowing and trace spurring again at the trochleoulnar joint. There is no dislocation. Calcific plaques in the brachial artery, radial, ulnar and interosseous arteries are again shown. Subcutaneous soft tissue mass measuring 2.2 cm again noted in the antecubital  fossa, with scattered coarse dystrophic calcifications. Another is seen in the ventrolateral upper arm measuring up to 5.5 cm and a third mass, also  with dystrophic calcifications is partially visible just above this. All of these were seen previously. There have been no interval radiographic changes. IMPRESSION: 1. No fracture or destructive lesion is seen. 2. Calcific arteriosclerosis. 3. Again noted joint effusion. 4. Diffuse edema greater posteriorly with stable subcutaneous soft tissue masses with dystrophic calcifications. Electronically Signed   By: Telford Nab M.D.   On: 06/24/2021 00:09   US Venous Img Upper Uni Left  Result Date: 06/23/2021 CLINICAL DATA:  Left upper extremity swelling, known arteriovenous fistula EXAM: LEFT UPPER EXTREMITY VENOUS DOPPLER ULTRASOUND TECHNIQUE: Gray-scale sonography with graded compression, as well as color Doppler and duplex ultrasound were performed to evaluate the upper extremity deep venous system from the level of the subclavian vein and including the jugular, axillary, basilic, radial, ulnar and upper cephalic vein. Spectral Doppler was utilized to evaluate flow at rest and with distal augmentation maneuvers. COMPARISON:  None. FINDINGS: Contralateral Subclavian Vein: Respiratory phasicity is normal and symmetric with the symptomatic side. No evidence of thrombus. Normal compressibility. Internal Jugular Vein: No evidence of thrombus. Normal compressibility, respiratory phasicity and response to augmentation. Subclavian Vein: No evidence of thrombus. Normal compressibility, respiratory phasicity and response to augmentation. Axillary Vein: No evidence of thrombus. Normal compressibility, respiratory phasicity and response to augmentation. Cephalic Vein: No evidence of thrombus. Normal compressibility, respiratory phasicity and response to augmentation. Basilic Vein: No evidence of thrombus. Normal compressibility, respiratory phasicity and response to  augmentation. Brachial Veins: No evidence of thrombus. Normal compressibility, respiratory phasicity and response to augmentation. Radial Veins: No evidence of thrombus. Normal compressibility, respiratory phasicity and response to augmentation. Ulnar Veins: No evidence of thrombus. Normal compressibility, respiratory phasicity and response to augmentation. Venous Reflux:  None visualized. 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 Mild dilatation is noted in its proximal segment. IMPRESSION: No evidence of DVT within the left upper extremity. Patent arteriovenous fistula. Electronically Signed   By: Inez Catalina M.D.   On: 06/23/2021 23:21     PROCEDURES:  Critical Care performed: Yes, see critical care procedure note(s)   CRITICAL CARE Performed by: Cyril Mourning Brittanie Dosanjh   Total critical care time: 45 minutes  Critical care time was exclusive of separately billable procedures and treating other patients.  Critical care was necessary to treat or prevent imminent or life-threatening deterioration.  Critical care was time spent personally by me on the following activities: development of treatment plan with patient and/or surrogate as well as nursing, discussions with consultants, evaluation of patient's response to treatment, examination of patient, obtaining history from patient or surrogate, ordering and performing treatments and interventions, ordering and review of laboratory studies, ordering and review of radiographic studies, pulse oximetry and re-evaluation of patient's condition.   .Joint Aspiration/Arthrocentesis  Date/Time: 06/24/2021 1:06 AM Performed by: Alyscia Carmon, Delice Bison, DO Authorized by: Lanell Carpenter, Delice Bison, DO   Consent:    Consent obtained:  Written   Consent given by:  Patient   Risks, benefits, and alternatives were discussed: yes     Risks discussed:  Bleeding, infection, pain, nerve damage and incomplete drainage   Alternatives discussed:  Observation,  referral and alternative treatment Universal protocol:    Procedure explained and questions answered to patient or proxy's satisfaction: yes     Relevant documents present and verified: yes     Test results available: yes     Imaging studies available: yes     Required blood products, implants, devices, and special equipment available: yes     Site/side marked:  yes     Immediately prior to procedure, a time out was called: yes     Patient identity confirmed:  Verbally with patient Location:    Location:  Elbow   Elbow:  L elbow Anesthesia:    Anesthesia method:  Local infiltration   Local anesthetic:  Lidocaine 2% WITH epi Procedure details:    Preparation: Patient was prepped and draped in usual sterile fashion     Needle gauge:  18 G   Ultrasound guidance: no     Approach:  Lateral   Aspirate amount:  1.5 mL   Aspirate characteristics:  Blood-tinged and yellow   Steroid injected: no     Specimen collected: yes   Post-procedure details:    Dressing:  Adhesive bandage   Procedure completion:  Tolerated well, no immediate complications    IMPRESSION / MDM / ASSESSMENT AND PLAN / ED COURSE  I reviewed the triage vital signs and the nursing notes.    Patient here with increasing left elbow pain and swelling.     DIFFERENTIAL DIAGNOSIS (includes but not limited to):   Septic arthritis, gout, septic bursitis, olecranon bursitis, cellulitis, abscess, DVT.  No sign of arterial obstruction, compartment syndrome.  Low suspicion for fracture.   PLAN: We will obtain labs including CBC, BMP, procalcitonin.  Will obtain x-ray of the left upper extremity and venous Doppler.  Will give pain medication.   MEDICATIONS GIVEN IN ED: Medications  vancomycin (VANCOREADY) IVPB 2000 mg/400 mL (has no administration in time range)  HYDROmorphone (DILAUDID) injection 1 mg (has no administration in time range)  HYDROmorphone (DILAUDID) injection 1 mg (1 mg Intravenous Given 06/23/21 2348)   ondansetron (ZOFRAN) injection 4 mg (4 mg Intravenous Given 06/23/21 2347)  lidocaine-EPINEPHrine (XYLOCAINE W/EPI) 2 %-1:200000 (PF) injection 20 mL (20 mLs Intradermal Given by Other 06/24/21 0100)  cefTRIAXone (ROCEPHIN) 2 g in sodium chloride 0.9 % 100 mL IVPB (0 g Intravenous Stopped 06/24/21 0237)     ED COURSE: Patient reports pain has improved significantly after Dilaudid.  His labs show no leukocytosis.  He has mild elevation of his creatinine which appears to be his baseline.  Procalcitonin is 0.22.  X-ray and Doppler reviewed by myself and radiologist.  X-ray of the left elbow again demonstrates an anterior fat pad but no other acute abnormality.  Venous Doppler shows that he has a patent AV fistula and no DVT.  My exam reveals no sign of arterial obstruction and he has no signs of compartment syndrome.  Discussed with him that I suspect that this is likely gout versus olecranon bursitis.  This could be from infection especially given he is immunocompromised on immunosuppressants from his renal transplant.  Will give dose of Rocephin here given procalcitonin is slightly elevated at 0.22.  We have discussed risk and benefits of arthrocentesis.  He agrees to proceed to evaluate for gout versus septic arthritis.  Small amount of synovial fluid was collected under sterile technique.  Discussed with lab technician and only enough fluid to run gram stain, culture and crystals but not enough for cell count.   2:50 AM  Pt's gram stain has come back showing gram-positive cocci and many white blood cells.  Patient also has monosodium urate crystals.  Will cover with Rocephin and vancomycin for broad coverage and discussed with orthopedics for concern for possible septic arthritis.  CONSULTS:   2:55 AM  Consulted and discussed patient's case with orthopedic surgeon, Dr. Harlow Mares.  Consulting physician recommends hospitalist admission and  he will see the patient in the morning for consultation.   3:04 AM   Consulted and discussed patient's case with hospitalist, Dr. Sidney Ace.  I have recommended admission and consulting physician agrees and will place admission orders.  Patient (and family if present) agree with this plan.   I reviewed all nursing notes, vitals, pertinent previous records.  All labs, EKGs, imaging ordered have been independently reviewed and interpreted by myself.    OUTSIDE RECORDS REVIEWED: Reviewed patient's records from ED visit on 06/17/2021 and patient's primary care note on 06/18/2021.         FINAL CLINICAL IMPRESSION(S) / ED DIAGNOSES   Final diagnoses:  Left elbow pain  Acute idiopathic gout of left elbow  Pyogenic arthritis of left elbow, due to unspecified organism [facility])     Rx / DC Orders   ED Discharge Orders     None        Note:  This document was prepared using Dragon voice recognition software and may include unintentional dictation errors.   Shamell Suarez, Delice Bison, DO 06/24/21 [id number]

## 2021-06-23 NOTE — ED Notes (Signed)
Called lab due to only one arm use and was able to stick

## 2021-06-23 NOTE — ED Triage Notes (Signed)
Pt with AVF in left arm. Pt states he has not had to have dialysis since 2016. Pt complains of swelling to left arm. Pt was seen in [place] for same on Tuesday. Pt states was diagnosed with gout but did not receive and ultrasound. Pt states swelling has worsened and pain has worsened. Cms intact ot fingers.

## 2021-06-24 ENCOUNTER — Encounter
Admission: EM | Disposition: A | Discharge status: Home Health | Payer: Self-pay | Source: Home / Self Care | Attending: Internal Medicine

## 2021-06-24 ENCOUNTER — Inpatient Hospital Stay: Payer: BC Managed Care – PPO | Admitting: Anesthesiology

## 2021-06-24 ENCOUNTER — Other Ambulatory Visit: Payer: Self-pay

## 2021-06-24 ENCOUNTER — Encounter: Payer: Self-pay | Admitting: Family Medicine

## 2021-06-24 DIAGNOSIS — E785 Hyperlipidemia, unspecified: Secondary | ICD-10-CM

## 2021-06-24 DIAGNOSIS — M009 Pyogenic arthritis, unspecified: Secondary | ICD-10-CM | POA: Diagnosis present

## 2021-06-24 DIAGNOSIS — M10022 Idiopathic gout, left elbow: Secondary | ICD-10-CM

## 2021-06-24 DIAGNOSIS — E1165 Type 2 diabetes mellitus with hyperglycemia: Secondary | ICD-10-CM

## 2021-06-24 HISTORY — PX: IRRIGATION AND DEBRIDEMENT ELBOW: SHX6886

## 2021-06-24 LAB — CBC
HCT: 45.4 % (ref 39.0–52.0)
Hemoglobin: 13.7 g/dL (ref 13.0–17.0)
MCH: 24.7 pg — ABNORMAL LOW (ref 26.0–34.0)
MCHC: 30.2 g/dL (ref 30.0–36.0)
MCV: 81.8 fL (ref 80.0–100.0)
Platelets: 228 10*3/uL (ref 150–400)
RBC: 5.55 MIL/uL (ref 4.22–5.81)
RDW: 15.6 % — ABNORMAL HIGH (ref 11.5–15.5)
WBC: 7.8 10*3/uL (ref 4.0–10.5)
nRBC: 0 % (ref 0.0–0.2)

## 2021-06-24 LAB — GLUCOSE, CAPILLARY
Glucose-Capillary: 135 mg/dL — ABNORMAL HIGH (ref 70–99)
Glucose-Capillary: 40 mg/dL — CL (ref 70–99)
Glucose-Capillary: 42 mg/dL — CL (ref 70–99)
Glucose-Capillary: 123 mg/dL — ABNORMAL HIGH (ref 70–99)
Glucose-Capillary: 132 mg/dL — ABNORMAL HIGH (ref 70–99)
Glucose-Capillary: 51 mg/dL — ABNORMAL LOW (ref 70–99)

## 2021-06-24 LAB — BASIC METABOLIC PANEL
Anion gap: 9 (ref 5–15)
BUN: 26 mg/dL — ABNORMAL HIGH (ref 6–20)
CO2: 21 mmol/L — ABNORMAL LOW (ref 22–32)
Calcium: 9.6 mg/dL (ref 8.9–10.3)
Chloride: 109 mmol/L (ref 98–111)
Creatinine, Ser: 1.12 mg/dL (ref 0.61–1.24)
GFR, Estimated: >60 mL/min (ref >60)
Glucose, Bld: 158 mg/dL — ABNORMAL HIGH (ref 70–99)
Potassium: 4.5 mmol/L (ref 3.5–5.1)
Sodium: 139 mmol/L (ref 135–145)

## 2021-06-24 LAB — RESP PANEL BY RT-PCR (FLU A&B, COVID) ARPGX2
Influenza A by PCR: NEGATIVE
Influenza B by PCR: NEGATIVE
SARS Coronavirus 2 by RT PCR: NEGATIVE

## 2021-06-24 LAB — PROCALCITONIN: Procalcitonin: 0.22 ng/mL

## 2021-06-24 LAB — GRAM STAIN

## 2021-06-24 LAB — HEMOGLOBIN A1C
Hgb A1c MFr Bld: 10.6 % — ABNORMAL HIGH (ref 4.8–5.6)
Mean Plasma Glucose: 258 mg/dL

## 2021-06-24 LAB — SYNOVIAL FLUID, CRYSTAL

## 2021-06-24 LAB — HIV ANTIBODY (ROUTINE TESTING W REFLEX): HIV Screen 4th Generation wRfx: NONREACTIVE

## 2021-06-24 LAB — CBG MONITORING, ED: Glucose-Capillary: 172 mg/dL — ABNORMAL HIGH (ref 70–99)

## 2021-06-24 SURGERY — IRRIGATION AND DEBRIDEMENT ELBOW
Anesthesia: General | Laterality: Left

## 2021-06-24 MED ORDER — MORPHINE SULFATE (PF) 4 MG/ML IV SOLN
4.0000 mg | INTRAVENOUS | Status: DC | PRN
Start: 2021-06-24 — End: 2021-06-24
  Administered 2021-06-24 (×2): 4 mg via INTRAVENOUS
  Filled 2021-06-24 (×2): qty 1

## 2021-06-24 MED ORDER — ONDANSETRON HCL 4 MG/2ML IJ SOLN: 4.0000 mg | Freq: Four times a day (QID) | INTRAMUSCULAR | Status: DC | PRN

## 2021-06-24 MED ORDER — HYDRALAZINE HCL 20 MG/ML IJ SOLN: 10.0000 mg | Freq: Once | INTRAMUSCULAR | Status: AC

## 2021-06-24 MED ORDER — SODIUM CHLORIDE 0.9 % IV SOLN: INTRAVENOUS | Status: DC

## 2021-06-24 MED ORDER — ONDANSETRON HCL 4 MG/2ML IJ SOLN
INTRAMUSCULAR | Status: DC | PRN
Start: 2021-06-24 — End: 2021-06-24
  Administered 2021-06-24: 4 mg via INTRAVENOUS

## 2021-06-24 MED ORDER — MAGNESIUM HYDROXIDE 400 MG/5ML PO SUSP: 30.0000 mL | Freq: Every day | ORAL | Status: DC | PRN

## 2021-06-24 MED ORDER — SODIUM CHLORIDE 0.9 % IV SOLN
2.0000 g | Freq: Once | INTRAVENOUS | Status: AC
  Administered 2021-06-24: 2 g via INTRAVENOUS
  Filled 2021-06-24: qty 20

## 2021-06-24 MED ORDER — OXYCODONE HCL 5 MG PO TABS
5.0000 mg | ORAL_TABLET | Freq: Once | ORAL | Status: AC | PRN
  Administered 2021-06-24: 5 mg via ORAL

## 2021-06-24 MED ORDER — MUPIROCIN 2 % EX OINT
1.0000 "application " | TOPICAL_OINTMENT | Freq: Two times a day (BID) | CUTANEOUS | Status: DC
  Administered 2021-06-24 – 2021-06-26 (×5): 1 via NASAL
  Filled 2021-06-24: qty 22

## 2021-06-24 MED ORDER — VANCOMYCIN HCL 2000 MG/400ML IV SOLN
2000.0000 mg | Freq: Once | INTRAVENOUS | Status: AC
  Administered 2021-06-24: 2000 mg via INTRAVENOUS
  Filled 2021-06-24: qty 400

## 2021-06-24 MED ORDER — ACETAMINOPHEN 650 MG RE SUPP: 650.0000 mg | Freq: Four times a day (QID) | RECTAL | Status: DC | PRN

## 2021-06-24 MED ORDER — HYDROCODONE-ACETAMINOPHEN 5-325 MG PO TABS
1.0000 | ORAL_TABLET | ORAL | Status: DC | PRN
  Administered 2021-06-26: 1 via ORAL
  Filled 2021-06-24: qty 1

## 2021-06-24 MED ORDER — OXYCODONE HCL 5 MG PO TABS
ORAL_TABLET | ORAL | Status: AC
  Filled 2021-06-24: qty 1

## 2021-06-24 MED ORDER — HYDRALAZINE HCL 25 MG PO TABS
25.0000 mg | ORAL_TABLET | Freq: Two times a day (BID) | ORAL | Status: DC
  Administered 2021-06-24: 25 mg via ORAL
  Filled 2021-06-24: qty 1

## 2021-06-24 MED ORDER — ONDANSETRON HCL 4 MG PO TABS: 4.0000 mg | ORAL_TABLET | Freq: Four times a day (QID) | ORAL | Status: DC | PRN

## 2021-06-24 MED ORDER — OXYCODONE HCL 5 MG/5ML PO SOLN: 5.0000 mg | Freq: Once | ORAL | Status: AC | PRN

## 2021-06-24 MED ORDER — HYDROCODONE-ACETAMINOPHEN 5-325 MG PO TABS
1.0000 | ORAL_TABLET | Freq: Four times a day (QID) | ORAL | Status: DC | PRN
Start: 2021-06-24 — End: 2021-06-24
  Administered 2021-06-24: 1 via ORAL
  Filled 2021-06-24: qty 1

## 2021-06-24 MED ORDER — BUPIVACAINE-EPINEPHRINE 0.25% -1:200000 IJ SOLN
INTRAMUSCULAR | Status: DC | PRN
  Administered 2021-06-24 (×2): 10 mL

## 2021-06-24 MED ORDER — OXYCODONE HCL 5 MG PO TABS: 5.0000 mg | ORAL_TABLET | ORAL | Status: DC | PRN

## 2021-06-24 MED ORDER — PROMETHAZINE HCL 25 MG/ML IJ SOLN: 6.2500 mg | INTRAMUSCULAR | Status: DC | PRN

## 2021-06-24 MED ORDER — GLIPIZIDE ER 5 MG PO TB24
5.0000 mg | ORAL_TABLET | Freq: Every day | ORAL | Status: DC
  Filled 2021-06-24 (×2): qty 1

## 2021-06-24 MED ORDER — DOXAZOSIN MESYLATE 4 MG PO TABS
6.0000 mg | ORAL_TABLET | Freq: Every day | ORAL | Status: DC
  Administered 2021-06-24 – 2021-06-27 (×4): 6 mg via ORAL
  Filled 2021-06-24 (×5): qty 2

## 2021-06-24 MED ORDER — ACETAMINOPHEN 325 MG PO TABS: 650.0000 mg | ORAL_TABLET | Freq: Four times a day (QID) | ORAL | Status: DC | PRN

## 2021-06-24 MED ORDER — PHENYLEPHRINE HCL (PRESSORS) 10 MG/ML IV SOLN
INTRAVENOUS | Status: DC | PRN
  Administered 2021-06-24: 50 ug via INTRAVENOUS
  Administered 2021-06-24: 100 ug via INTRAVENOUS
  Administered 2021-06-24: 50 ug via INTRAVENOUS

## 2021-06-24 MED ORDER — HYDROMORPHONE HCL 1 MG/ML IJ SOLN
1.0000 mg | Freq: Once | INTRAMUSCULAR | Status: AC
  Administered 2021-06-24: 1 mg via INTRAVENOUS
  Filled 2021-06-24: qty 1

## 2021-06-24 MED ORDER — DEXTROSE 50 % IV SOLN
1.0000 | Freq: Once | INTRAVENOUS | Status: AC
  Administered 2021-06-24: 50 mL via INTRAVENOUS

## 2021-06-24 MED ORDER — LIDOCAINE-EPINEPHRINE (PF) 2 %-1:200000 IJ SOLN
20.0000 mL | Freq: Once | INTRAMUSCULAR | Status: AC
  Administered 2021-06-24: 20 mL via INTRADERMAL
  Filled 2021-06-24 (×3): qty 20

## 2021-06-24 MED ORDER — SODIUM BICARBONATE 650 MG PO TABS
1300.0000 mg | ORAL_TABLET | Freq: Three times a day (TID) | ORAL | Status: DC
  Administered 2021-06-24 – 2021-06-27 (×8): 1300 mg via ORAL
  Filled 2021-06-24 (×12): qty 2

## 2021-06-24 MED ORDER — ACETAMINOPHEN 325 MG PO TABS: 325.0000 mg | ORAL_TABLET | Freq: Four times a day (QID) | ORAL | Status: DC | PRN

## 2021-06-24 MED ORDER — TRAZODONE HCL 50 MG PO TABS
25.0000 mg | ORAL_TABLET | Freq: Every evening | ORAL | Status: DC | PRN
  Administered 2021-06-24 – 2021-06-26 (×3): 25 mg via ORAL
  Filled 2021-06-24 (×3): qty 1

## 2021-06-24 MED ORDER — ENOXAPARIN SODIUM 60 MG/0.6ML IJ SOSY
0.5000 mg/kg | PREFILLED_SYRINGE | INTRAMUSCULAR | Status: DC
  Administered 2021-06-25 – 2021-06-27 (×3): 52.5 mg via SUBCUTANEOUS
  Filled 2021-06-24 (×4): qty 0.6

## 2021-06-24 MED ORDER — ACETAMINOPHEN 10 MG/ML IV SOLN: 1000.0000 mg | Freq: Once | INTRAVENOUS | Status: DC | PRN

## 2021-06-24 MED ORDER — SODIUM CHLORIDE 0.9 % IR SOLN
Status: DC | PRN
  Administered 2021-06-24: 3000 mL

## 2021-06-24 MED ORDER — DULAGLUTIDE 0.75 MG/0.5ML ~~LOC~~ SOAJ: 0.7500 mg | SUBCUTANEOUS | Status: DC

## 2021-06-24 MED ORDER — FENTANYL CITRATE (PF) 100 MCG/2ML IJ SOLN
INTRAMUSCULAR | Status: AC
  Filled 2021-06-24: qty 2

## 2021-06-24 MED ORDER — PROPOFOL 10 MG/ML IV BOLUS
INTRAVENOUS | Status: DC | PRN
  Administered 2021-06-24: 100 mg via INTRAVENOUS

## 2021-06-24 MED ORDER — INSULIN ASPART 100 UNIT/ML IJ SOLN
0.0000 [IU] | Freq: Three times a day (TID) | INTRAMUSCULAR | Status: DC
  Administered 2021-06-25 (×2): 2 [IU] via SUBCUTANEOUS
  Administered 2021-06-27: 1 [IU] via SUBCUTANEOUS
  Filled 2021-06-24 (×3): qty 1

## 2021-06-24 MED ORDER — FENTANYL CITRATE (PF) 100 MCG/2ML IJ SOLN
INTRAMUSCULAR | Status: AC
  Administered 2021-06-24: 50 ug via INTRAVENOUS
  Filled 2021-06-24: qty 2

## 2021-06-24 MED ORDER — INSULIN NPH (HUMAN) (ISOPHANE) 100 UNIT/ML ~~LOC~~ SUSP
100.0000 [IU] | Freq: Every day | SUBCUTANEOUS | Status: DC
  Administered 2021-06-24: 100 [IU] via SUBCUTANEOUS
  Filled 2021-06-24: qty 10

## 2021-06-24 MED ORDER — DOCUSATE SODIUM 100 MG PO CAPS
100.0000 mg | ORAL_CAPSULE | Freq: Two times a day (BID) | ORAL | Status: DC
  Administered 2021-06-24 – 2021-06-27 (×6): 100 mg via ORAL
  Filled 2021-06-24 (×6): qty 1

## 2021-06-24 MED ORDER — SODIUM CHLORIDE 0.9 % IV SOLN
2.0000 g | INTRAVENOUS | Status: DC
  Administered 2021-06-25 – 2021-06-27 (×3): 2 g via INTRAVENOUS
  Filled 2021-06-24 (×4): qty 20

## 2021-06-24 MED ORDER — FENTANYL CITRATE (PF) 100 MCG/2ML IJ SOLN
INTRAMUSCULAR | Status: DC | PRN
  Administered 2021-06-24 (×2): 50 ug via INTRAVENOUS

## 2021-06-24 MED ORDER — METOCLOPRAMIDE HCL 5 MG/ML IJ SOLN: 5.0000 mg | Freq: Three times a day (TID) | INTRAMUSCULAR | Status: DC | PRN

## 2021-06-24 MED ORDER — NEOMYCIN-POLYMYXIN B GU 40-200000 IR SOLN
Status: DC | PRN
  Administered 2021-06-24: 4 mL

## 2021-06-24 MED ORDER — ADULT MULTIVITAMIN W/MINERALS CH
1.0000 | ORAL_TABLET | Freq: Every day | ORAL | Status: DC
  Administered 2021-06-25 – 2021-06-27 (×3): 1 via ORAL
  Filled 2021-06-24 (×3): qty 1

## 2021-06-24 MED ORDER — COLCHICINE 0.6 MG PO TABS
0.6000 mg | ORAL_TABLET | Freq: Every day | ORAL | Status: DC
  Administered 2021-06-25 – 2021-06-27 (×3): 0.6 mg via ORAL
  Filled 2021-06-24 (×4): qty 1

## 2021-06-24 MED ORDER — ATORVASTATIN CALCIUM 10 MG PO TABS
10.0000 mg | ORAL_TABLET | Freq: Every day | ORAL | Status: DC
  Administered 2021-06-24 – 2021-06-27 (×4): 10 mg via ORAL
  Filled 2021-06-24 (×4): qty 1

## 2021-06-24 MED ORDER — MORPHINE SULFATE (PF) 2 MG/ML IV SOLN
2.0000 mg | INTRAVENOUS | Status: DC | PRN
  Administered 2021-06-24: 2 mg via INTRAVENOUS
  Filled 2021-06-24: qty 1

## 2021-06-24 MED ORDER — MORPHINE SULFATE (PF) 2 MG/ML IV SOLN
0.5000 mg | INTRAVENOUS | Status: DC | PRN
  Administered 2021-06-27: 1 mg via INTRAVENOUS
  Filled 2021-06-24: qty 1

## 2021-06-24 MED ORDER — HYDROCODONE-ACETAMINOPHEN 7.5-325 MG PO TABS
1.0000 | ORAL_TABLET | ORAL | Status: DC | PRN
  Administered 2021-06-24 – 2021-06-25 (×4): 1 via ORAL
  Administered 2021-06-25: 2 via ORAL
  Administered 2021-06-26: 1 via ORAL
  Administered 2021-06-26: 2 via ORAL
  Administered 2021-06-26: 1 via ORAL
  Administered 2021-06-27: 2 via ORAL
  Administered 2021-06-27: 1 via ORAL
  Filled 2021-06-24 (×2): qty 2
  Filled 2021-06-24 (×3): qty 1
  Filled 2021-06-24: qty 2
  Filled 2021-06-24: qty 1
  Filled 2021-06-24: qty 2
  Filled 2021-06-24 (×2): qty 1

## 2021-06-24 MED ORDER — DEXTROSE 50 % IV SOLN
INTRAVENOUS | Status: AC
  Filled 2021-06-24: qty 50

## 2021-06-24 MED ORDER — METOCLOPRAMIDE HCL 10 MG PO TABS: 5.0000 mg | ORAL_TABLET | Freq: Three times a day (TID) | ORAL | Status: DC | PRN

## 2021-06-24 MED ORDER — FENTANYL CITRATE (PF) 100 MCG/2ML IJ SOLN
25.0000 ug | INTRAMUSCULAR | Status: DC | PRN
  Administered 2021-06-24: 50 ug via INTRAVENOUS

## 2021-06-24 MED ORDER — VITAMIN D (ERGOCALCIFEROL) 1.25 MG (50000 UNIT) PO CAPS
50000.0000 [IU] | ORAL_CAPSULE | ORAL | Status: DC
  Filled 2021-06-24: qty 1

## 2021-06-24 MED ORDER — TACROLIMUS 1 MG PO CAPS
3.0000 mg | ORAL_CAPSULE | Freq: Two times a day (BID) | ORAL | Status: DC
  Administered 2021-06-24 – 2021-06-27 (×6): 3 mg via ORAL
  Filled 2021-06-24 (×8): qty 3

## 2021-06-24 MED ORDER — HYDRALAZINE HCL 20 MG/ML IJ SOLN
INTRAMUSCULAR | Status: AC
  Administered 2021-06-24: 10 mg
  Filled 2021-06-24: qty 1

## 2021-06-24 MED ORDER — GLYCOPYRROLATE 0.2 MG/ML IJ SOLN
INTRAMUSCULAR | Status: DC | PRN
  Administered 2021-06-24: .2 mg via INTRAVENOUS

## 2021-06-24 MED ORDER — LIDOCAINE HCL (CARDIAC) PF 100 MG/5ML IV SOSY
PREFILLED_SYRINGE | INTRAVENOUS | Status: DC | PRN
  Administered 2021-06-24: 30 mg via INTRAVENOUS

## 2021-06-24 MED ORDER — 0.9 % SODIUM CHLORIDE (POUR BTL) OPTIME
TOPICAL | Status: DC | PRN
  Administered 2021-06-24: 120 mL

## 2021-06-24 MED ORDER — CHLORHEXIDINE GLUCONATE CLOTH 2 % EX PADS
6.0000 | MEDICATED_PAD | Freq: Every day | CUTANEOUS | Status: DC
  Administered 2021-06-27: 6 via TOPICAL

## 2021-06-24 MED ORDER — CLONIDINE HCL 0.3 MG/24HR TD PTWK
0.3000 mg | MEDICATED_PATCH | TRANSDERMAL | Status: DC
  Filled 2021-06-24 (×2): qty 1

## 2021-06-24 MED ORDER — DEXTROSE 50 % IV SOLN
50.0000 mL | Freq: Once | INTRAVENOUS | Status: AC
  Administered 2021-06-24: 50 mL via INTRAVENOUS

## 2021-06-24 SURGICAL SUPPLY — 41 items
BNDG COHESIVE 4X5 TAN ST LF (GAUZE/BANDAGES/DRESSINGS) ×2 IMPLANT
BNDG ELASTIC 4X5.8 VLCR STR LF (GAUZE/BANDAGES/DRESSINGS) ×1 IMPLANT
BNDG GAUZE ELAST 4 BULKY (GAUZE/BANDAGES/DRESSINGS) ×2 IMPLANT
BRUSH SCRUB EZ  4% CHG (MISCELLANEOUS) ×2
BRUSH SCRUB EZ 4% CHG (MISCELLANEOUS) ×2 IMPLANT
CHLORAPREP W/TINT 26 (MISCELLANEOUS) ×4 IMPLANT
DRAPE 3/4 80X56 (DRAPES) IMPLANT
DRAPE INCISE IOBAN 66X60 STRL (DRAPES) ×2 IMPLANT
DRAPE SURG 17X11 SM STRL (DRAPES) IMPLANT
DRAPE U-SHAPE 47X51 STRL (DRAPES) IMPLANT
ELECT REM PT RETURN 9FT ADLT (ELECTROSURGICAL) ×2
ELECTRODE REM PT RTRN 9FT ADLT (ELECTROSURGICAL) ×1 IMPLANT
GAUZE SPONGE 4X4 12PLY STRL (GAUZE/BANDAGES/DRESSINGS) ×1 IMPLANT
GAUZE XEROFORM 1X8 LF (GAUZE/BANDAGES/DRESSINGS) ×1 IMPLANT
GLOVE SURG ORTHO LTX SZ8 (GLOVE) ×2 IMPLANT
GLOVE SURG UNDER LTX SZ8 (GLOVE) ×2 IMPLANT
GOWN STRL REUS W/ TWL LRG LVL3 (GOWN DISPOSABLE) ×1 IMPLANT
GOWN STRL REUS W/ TWL XL LVL3 (GOWN DISPOSABLE) ×1 IMPLANT
GOWN STRL REUS W/TWL LRG LVL3 (GOWN DISPOSABLE) ×1
GOWN STRL REUS W/TWL XL LVL3 (GOWN DISPOSABLE) ×1
KIT TURNOVER KIT A (KITS) ×2 IMPLANT
LOOP RED MAXI  1X406MM (MISCELLANEOUS) ×1
LOOP VESSEL MAXI 1X406 RED (MISCELLANEOUS) IMPLANT
MANIFOLD NEPTUNE II (INSTRUMENTS) ×2 IMPLANT
NS IRRIG 1000ML POUR BTL (IV SOLUTION) ×2 IMPLANT
PACK EXTREMITY ARMC (MISCELLANEOUS) ×2 IMPLANT
PAD ABD DERMACEA PRESS 5X9 (GAUZE/BANDAGES/DRESSINGS) ×1 IMPLANT
PADDING CAST 4IN STRL (MISCELLANEOUS) ×1
PADDING CAST BLEND 4X4 STRL (MISCELLANEOUS) IMPLANT
STAPLER SKIN PROX 35W (STAPLE) ×1 IMPLANT
STOCKINETTE IMPERVIOUS 9X36 MD (GAUZE/BANDAGES/DRESSINGS) ×2 IMPLANT
SUT ETHILON 2 0 FSLX (SUTURE) IMPLANT
SUT ETHILON NAB PS2 4-0 18IN (SUTURE) ×1 IMPLANT
SUT MON AB 2-0 CT1 36 (SUTURE) ×2 IMPLANT
SUT PDS AB 2-0 CT1 27 (SUTURE) IMPLANT
SUT VIC AB 1 CT1 36 (SUTURE) IMPLANT
SUT VIC AB 2-0 CT1 27 (SUTURE)
SUT VIC AB 2-0 CT1 TAPERPNT 27 (SUTURE) IMPLANT
SUT VICRYL AB 3-0 FS1 BRD 27IN (SUTURE) IMPLANT
TOWEL OR 17X26 4PK STRL BLUE (TOWEL DISPOSABLE) ×2 IMPLANT
WATER STERILE IRR 500ML POUR (IV SOLUTION) ×1 IMPLANT

## 2021-06-24 NOTE — Assessment & Plan Note (Addendum)
Uncontrolled with last A1c in October 15.5%.  Repeat this admission 10.6%.  Home regimen is NPH Insulin 712 units Daily,Trulicity weekly, glipizide 5 mg Daily --Diabetes coordinator following --Sliding scale Novolog --Continue NPH, reduce to 50 units given hypoglycemic --Hold Trulicity, not on formulary --Stop glipizide (hypoglycemic episodes)

## 2021-06-24 NOTE — Plan of Care (Signed)
°  Problem: Education: Goal: Knowledge of General Education information will improve Description: Including pain rating scale, medication(s)/side effects and non-pharmacologic comfort measures Outcome: Progressing   Problem: Health Behavior/Discharge Planning: Goal: Ability to manage health-related needs will improve Outcome: Progressing   Problem: Clinical Measurements: Goal: Ability to maintain clinical measurements within normal limits will improve Outcome: Progressing Goal: Will remain free from infection Outcome: Progressing Goal: Diagnostic test results will improve Outcome: Progressing Goal: Respiratory complications will improve Outcome: Progressing Goal: Cardiovascular complication will be avoided Outcome: Progressing   Problem: Activity: Goal: Risk for activity intolerance will decrease Outcome: Progressing   Problem: Nutrition: Goal: Adequate nutrition will be maintained Outcome: Progressing   Problem: Coping: Goal: Level of anxiety will decrease Outcome: Progressing   Problem: Safety: Goal: Ability to remain free from injury will improve Outcome: Progressing   Problem: Pain Managment: Goal: General experience of comfort will improve Outcome: Progressing

## 2021-06-24 NOTE — Anesthesia Procedure Notes (Signed)
Procedure Name: LMA Insertion Date/Time: 06/24/2021 5:05 PM Performed by: Lerry Liner, CRNA Pre-anesthesia Checklist: Patient identified, Emergency Drugs available, Suction available and Patient being monitored Patient Re-evaluated:Patient Re-evaluated prior to induction Oxygen Delivery Method: Circle system utilized Preoxygenation: Pre-oxygenation with 100% oxygen Induction Type: IV induction Ventilation: Mask ventilation without difficulty LMA: LMA inserted LMA Size: 4.5 Number of attempts: 1 Placement Confirmation: positive ETCO2, CO2 detector and breath sounds checked- equal and bilateral Tube secured with: Tape Dental Injury: Teeth and Oropharynx as per pre-operative assessment

## 2021-06-24 NOTE — Progress Notes (Addendum)
Inpatient Diabetes Program Recommendations  AACE/ADA: New Consensus Statement on Inpatient Glycemic Control (2015)  Target Ranges:  Prepandial:   less than 140 mg/dL      Peak postprandial:   less than 180 mg/dL (1-2 hours)      Critically ill patients:  140 - 180 mg/dL    Latest Reference Range & Units 06/23/21 21:15 06/24/21 06:53  Glucose 70 - 99 mg/dL 312 (H) 158 (H)  (H): Data is abnormally high    Admit: Left elbow arthritis concerning for possible septic arthritis with differential diagnoses include acute gouty arthritis  History: DM2, Renal Transplant  Home DM Meds: NPH Insulin 100 units Daily   Trulicity 8.37 mg Qweek   Glipizide 5 mg Daily  Current Orders: NPH Insulin 100 units Daily  Trulicity 7.93 mg Qweek  Glipizide 5 mg Daily    MD- Please add Novolog Sensitive Correction Scale/ SSI (0-9 units) TID AC + HS  Please also check Current A1c level--last one on file was >15% 03/05/2021    Endocrinologist: Dr. Loanne Drilling with [facility] Last seen 96/88/6484 Added Trulicity 7.20 mg Qweek at that visit At prior visit with Dr. Loanne Drilling (03/05/2021), pt was told to stop his Toujeo and Novolog insulins (was on Toujeo 80 units daily + Novolog 10 units TID with meals) and was placed on NPH Insulin 100 units Daily    --Will follow patient during hospitalization--  Wyn Quaker RN, MSN, CDE Diabetes Coordinator Inpatient Glycemic Control Team Team Pager: [phone] (8a-5p)

## 2021-06-24 NOTE — Assessment & Plan Note (Addendum)
Onset one week prior to presentation.  Presumed initially to be gout but did not respond to NSAID's outpatient. Joint aspirated in the ED, fluid culture with RARE GROUP STREP (S.AGALACTIAE). Blood cultures negative. --Ortho - recommendations: Follow up in clinic in 2 weeks for suture removal.  Dressing change tomorrow --ID consulted for antibiotic recs --Pain control PRN

## 2021-06-24 NOTE — Transfer of Care (Signed)
Immediate Anesthesia Transfer of Care Note  Patient: Raymond [name]  Procedure(s) Performed: IRRIGATION AND DEBRIDEMENT ELBOW (Left)  Patient Location: PACU  Anesthesia Type:General  Level of Consciousness: drowsy  Airway & Oxygen Therapy: Patient Spontanous Breathing and Patient connected to face mask oxygen  Post-op Assessment: Report given to RN  Post vital signs: stable  Last Vitals:  Vitals Value Taken Time  BP 93/59 06/24/21 1830  Temp    Pulse 57 06/24/21 1832  Resp 14 06/24/21 1832  SpO2 98 % 06/24/21 1832  Vitals shown include unvalidated device data.  Last Pain:  Vitals:   06/24/21 1552  TempSrc: Temporal  PainSc: 0-No pain         Complications: No notable events documented.

## 2021-06-24 NOTE — Progress Notes (Signed)
PHARMACIST - PHYSICIAN COMMUNICATION  CONCERNING:  Enoxaparin (Lovenox) for DVT Prophylaxis    RECOMMENDATION: Patient was prescribed enoxaprin 40mg  q24 hours for VTE prophylaxis.   Filed Weights   06/23/21 2100  Weight: 104.3 kg (230 lb)    Body mass index is 34.97 kg/m.  Estimated Creatinine Clearance: 65.2 mL/min (A) (by C-G formula based on SCr of 1.41 mg/dL (H)).   Based on [place] patient is candidate for enoxaparin 0.5mg /kg TBW SQ every 24 hours based on BMI being >30.  DESCRIPTION: Pharmacy has adjusted enoxaparin dose per [facility] policy.  Patient is now receiving enoxaparin 0.5 mg/kg every 24 hours    Renda Rolls, PharmD, [facility] 06/24/2021 3:56 AM

## 2021-06-24 NOTE — Consult Note (Signed)
ORTHOPAEDIC CONSULTATION  REQUESTING PHYSICIAN: Ezekiel Slocumb, DO  Chief Complaint: left elbow pain  HPI: Raymond [name] is a 61 y.o. male who complains of severe left elbow pain. The pain is sharp in character. The pain is severe and 10/10. The pain is worse with movement and better with rest. Denies any numbness, tingling or constitutional symptoms.  Past Medical History:  Diagnosis Date   Chronic kidney disease    Awaiting transplant   Diabetes ([place])    Diabetes mellitus without complication ([place])    Hypertension    Renal disorder    Renal insufficiency    Past Surgical History:  Procedure Laterality Date   BONE BIOPSY Right 03/06/2021   Procedure: BONE BIOPSY;  Surgeon: Trula Slade, DPM;  Location: [facility];  Service: Podiatry;  Laterality: Right;   CENTRAL VENOUS CATHETER INSERTION Right 01/20/2020   Procedure: INSERTION CENTRAL LINE ADULT; Tunneled central line;  Surgeon: Virl Cagey, MD;  Location: [facility];  Service: General;  Laterality: Right;   COLONOSCOPY N/A 12/05/2014   PPH:[id number] external and internal hemorrhoid/mild diverticulosis/11 polyps removed   ESOPHAGOGASTRODUODENOSCOPY N/A 12/05/2014   [facility]:HVFM duodenitis   GRAFT APPLICATION Right 73/40/3709   Procedure: GRAFT APPLICATION;  Surgeon: Trula Slade, DPM;  Location: [facility];  Service: Podiatry;  Laterality: Right;   IR FLUORO GUIDE CV LINE RIGHT  03/01/2019   IR REMOVAL TUN CV CATH W/O FL  05/10/2019   IR REMOVAL TUN CV CATH W/O FL  02/29/2020   IR US GUIDE VASC ACCESS RIGHT  03/01/2019   KIDNEY TRANSPLANT  03/27/2015   Penile Pump Insertion     TEE WITHOUT CARDIOVERSION N/A 01/17/2020   Procedure: TRANSESOPHAGEAL ECHOCARDIOGRAM (TEE) WITH PROPOFOL;  Surgeon: Arnoldo Lenis, MD;  Location: [facility];  Service: Endoscopy;  Laterality: N/A;   WOUND DEBRIDEMENT Right 03/06/2021   Procedure: DEBRIDEMENT WOUND;  Surgeon: Trula Slade, DPM;  Location: [facility];  Service:  Podiatry;  Laterality: Right;   Social History   Socioeconomic History   Marital status: Married    Spouse name: Not on file   Number of children: Not on file   Years of education: Not on file   Highest education level: Not on file  Occupational History   Not on file  Tobacco Use   Smoking status: Never   Smokeless tobacco: Never  Vaping Use   Vaping Use: Never used  Substance and Sexual Activity   Alcohol use: No    Alcohol/week: 0.0 standard drinks   Drug use: No   Sexual activity: Not on file  Other Topics Concern   Not on file  Social History Narrative   ** Merged History Encounter **       Social Determinants of Health   Financial Resource Strain: Not on file  Food Insecurity: Not on file  Transportation Needs: Not on file  Physical Activity: Not on file  Stress: Not on file  Social Connections: Not on file   Family History  Problem Relation Age of Onset   Diabetes Mother    Heart disease Father    Colon cancer Neg Hx    No Known Allergies Prior to Admission medications   Medication Sig Start Date End Date Taking? Authorizing Provider  blood glucose meter kit and supplies KIT Dispense based on patient and insurance preference. Use up to four times daily as directed. (FOR ICD-9 250.00, 250.01). 04/11/20  Yes [name], [name], MD  Blood Glucose Monitoring Suppl (ACCU-CHEK GUIDE  ME) w/Device KIT 1 Piece by Does not apply route as directed. 02/18/18  Yes [name], [name], MD  cloNIDine (CATAPRES - DOSED IN MG/24 HR) 0.3 mg/24hr patch Place 0.3 mg onto the skin once a week.   Yes [provider]  colchicine 0.6 MG tablet Take 0.6 mg by mouth daily. 06/17/21  Yes [provider]  doxazosin (CARDURA) 4 MG tablet Take 6 mg by mouth daily. 12/18/15  Yes [provider]  Dulaglutide (TRULICITY) 9.67 EL/3.8BO SOPN Inject 0.75 mg into the skin once a week. 03/25/21  Yes Renato Shin, MD  glipiZIDE (GLUCOTROL XL) 5 MG 24 hr tablet Take 5  mg by mouth daily.    Yes [provider]  glucose blood (ACCU-CHEK GUIDE) test strip Use as instructed 4 x daily. E11.65 02/19/18  Yes [name], [name], MD  hydrALAZINE (APRESOLINE) 25 MG tablet Take 25 mg by mouth 2 (two) times daily.   Yes [provider]  HYDROcodone-acetaminophen (NORCO/VICODIN) 5-325 MG tablet Take 1 tablet by mouth every 6 (six) hours as needed. 03/06/21  Yes Trula Slade, DPM  Insulin NPH, Human,, Isophane, (NOVOLIN N FLEXPEN) 100 UNIT/ML Kiwkpen Inject 100 Units into the skin every morning. 03/05/21  Yes Renato Shin, MD  Multiple Vitamin (MULTIVITAMIN) capsule Take 1 capsule by mouth daily.   Yes [provider]  sodium bicarbonate 650 MG tablet Take 1,300 mg by mouth 3 (three) times daily. 11/02/18  Yes [provider]  tacrolimus (PROGRAF) 1 MG capsule Take 3 mg by mouth 2 (two) times daily. Take 3 capsules (3m) in AM & take 3 caps (369m in PM   Yes [provider]  Vitamin D, Ergocalciferol, (DRISDOL) 50000 units CAPS capsule Take 50,000 Units by mouth every 30 (thirty) days.   Yes [provider]  atorvastatin (LIPITOR) 10 MG tablet Take 1 tablet (10 mg total) by mouth daily. 03/14/21 06/12/21  MoMinette BrineFNP  Continuous Blood Gluc Receiver (FREESTYLE LIBRE 2 READER) DEVI As directed Patient not taking: Reported on 06/18/2021 04/24/20   NiCassandria AngerMD  Continuous Blood Gluc Sensor (FREESTYLE LIBRE 2 SENSOR) MISC 1 Piece by Does not apply route every 14 (fourteen) days. Patient not taking: Reported on 06/18/2021 04/24/20   NiCassandria AngerMD  silver sulfADIAZINE (SILVADENE) 1 % cream Apply 1 application topically daily. Patient not taking: Reported on 05/15/2021 01/31/21   WaTrula SladeDPM  triamcinolone (KENALOG) 0.025 % ointment Apply 1 application topically 3 (three) times daily. Patient not taking: Reported on 05/15/2021 01/07/20   [provider]   DG Elbow Complete  Left  Result Date: 06/24/2021 CLINICAL DATA:  Left elbow pain and swelling. EXAM: LEFT ELBOW - COMPLETE 3+ VIEW COMPARISON:  Recent study 2 days ago 06/21/2021 FINDINGS: Circumferential soft tissue edema is again noted greatest posteriorly. There are positive fat pad signs again seen consistent with a joint effusion. There is no evidence of fractures or erosive arthropathy with mild narrowing and trace spurring again at the trochleoulnar joint. There is no dislocation. Calcific plaques in the brachial artery, radial, ulnar and interosseous arteries are again shown. Subcutaneous soft tissue mass measuring 2.2 cm again noted in the antecubital fossa, with scattered coarse dystrophic calcifications. Another is seen in the ventrolateral upper arm measuring up to 5.5 cm and a third mass, also with dystrophic calcifications is partially visible just above this. All of these were seen previously. There have been no interval radiographic changes. IMPRESSION: 1. No fracture or  destructive lesion is seen. 2. Calcific arteriosclerosis. 3. Again noted joint effusion. 4. Diffuse edema greater posteriorly with stable subcutaneous soft tissue masses with dystrophic calcifications. Electronically Signed   By: Telford Nab M.D.   On: 06/24/2021 00:09   US Venous Img Upper Uni Left  Result Date: 06/23/2021 CLINICAL DATA:  Left upper extremity swelling, known arteriovenous fistula EXAM: LEFT UPPER EXTREMITY VENOUS DOPPLER ULTRASOUND TECHNIQUE: Gray-scale sonography with graded compression, as well as color Doppler and duplex ultrasound were performed to evaluate the upper extremity deep venous system from the level of the subclavian vein and including the jugular, axillary, basilic, radial, ulnar and upper cephalic vein. Spectral Doppler was utilized to evaluate flow at rest and with distal augmentation maneuvers. COMPARISON:  None. FINDINGS: Contralateral Subclavian Vein: Respiratory phasicity is normal and symmetric with the  symptomatic side. No evidence of thrombus. Normal compressibility. Internal Jugular Vein: No evidence of thrombus. Normal compressibility, respiratory phasicity and response to augmentation. Subclavian Vein: No evidence of thrombus. Normal compressibility, respiratory phasicity and response to augmentation. Axillary Vein: No evidence of thrombus. Normal compressibility, respiratory phasicity and response to augmentation. Cephalic Vein: No evidence of thrombus. Normal compressibility, respiratory phasicity and response to augmentation. Basilic Vein: No evidence of thrombus. Normal compressibility, respiratory phasicity and response to augmentation. Brachial Veins: No evidence of thrombus. Normal compressibility, respiratory phasicity and response to augmentation. Radial Veins: No evidence of thrombus. Normal compressibility, respiratory phasicity and response to augmentation. Ulnar Veins: No evidence of thrombus. Normal compressibility, respiratory phasicity and response to augmentation. Venous Reflux:  None visualized. 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 Mild dilatation is noted in its proximal segment. IMPRESSION: No evidence of DVT within the left upper extremity. Patent arteriovenous fistula. Electronically Signed   By: Inez Catalina M.D.   On: 06/23/2021 23:21    Positive ROS: All other systems have been reviewed and were otherwise negative with the exception of those mentioned in the HPI and as above.  Physical Exam: General: Alert, no acute distress Cardiovascular: No pedal edema Respiratory: No cyanosis, no use of accessory musculature GI: No organomegaly, abdomen is soft and non-tender Skin: No lesions in the area of chief complaint, band-aid over aspiration site Neurologic: Sensation intact distally Psychiatric: Patient is competent for consent with normal mood and affect Lymphatic: No axillary or cervical lymphadenopathy  MUSCULOSKELETAL: left elbow pain with  passive/active ROM, moderate swelling and erythema in forearm. Compartments soft. Good cap refill. Motor and sensory intact distally.  Assessment: Left elbow septic arthritis  Plan: Continue to follow cultures closely, recommend ID consult. Plan irrigation and debridement of elbow later today. NPO.  The diagnosis, risks, benefits and alternatives to treatment are all discussed in detail with the patient. Risks include but are not limited to bleeding, infection, deep vein thrombosis, pulmonary embolism, nerve or vascular injury, non-union, repeat operation, persistent pain, weakness, stiffness and death. He understands and is eager to proceed.     Lovell Sheehan, MD    06/24/2021 8:57 AM

## 2021-06-24 NOTE — Progress Notes (Signed)
PHARMACY -  BRIEF ANTIBIOTIC NOTE   Pharmacy has received consult(s) for Vancomycin from an ED provider.  The patient's profile has been reviewed for ht/wt/allergies/indication/available labs.    One time order(s) placed for Vancomycin 2 gm per pt wt 104.3 kg.  Further antibiotics/pharmacy consults should be ordered by admitting physician if indicated.                       Renda Rolls, PharmD, MBA 06/24/2021 3:00 AM

## 2021-06-24 NOTE — Progress Notes (Signed)
°  Progress Note   Patient: Raymond [name] EEF:[id number] DOB: 05-04-1961 DOA: 06/23/2021     0 DOS: the patient was seen and examined on 06/24/2021   Brief hospital course: Same day as admission rounding note. See H&P by Dr. Sidney Ace.     I agree with the assessment and planned as outlined in H&P with any exceptions/changes as below.     Patient admitted with left elbow pain and swelling.  Found to have septic joint.   Ortho is consulted, plan for OR for I&D.  Assessment and Plan * Septic arthritis of elbow, left ([place])- (present on admission) Onset one week prior to presentation.  Presumed initially to be gout but did not respond to NSAID's outpatient. Joint aspirated in the ED, fluid culture with GPC's. --Ortho following --ID consulted --Adjusted pain meds for better relief  Type 2 diabetes mellitus ([place]) Uncontrolled with last A1c in October 15.5%. Home regimen is NPH Insulin 883 units Daily,Trulicity weekly, glipizide 5 mg Daily --Diabetes coordinator following --Sliding scale Novolog --Hold Trulicity, not on formulary --Continue glipizide --Repeat A1c is pending     Subjective: Patient seen in the ED holding for bed.  He reports ongoing left elbow pain which has not significantly improved with pain medication this morning.  Denies any other acute complaints.  Has previously had PICC line for long-term IV antibiotics in the past and agreeable if needed this time.  States Ortho was planning to washout the joint in the operating room.  Objective Vitals reviewed and notable for elevated blood pressures with systolic ranging [id number]. No fevers, no tachycardia or tachypnea, stable O2 sat on room air.  General exam: awake, alert, no acute distress HEENT: moist mucus membranes, hearing grossly normal  Respiratory system: CTAB, no wheezes, rales or rhonchi, normal respiratory effort. Cardiovascular system: normal S1/S2, RRR, no pedal edema.   Central nervous system: A&O x3. no  gross focal neurologic deficits, normal speech Extremities: Left upper extremity is edematous and warm to touch, left upper extremity AV fistula is in place, wearing a sling on the left upper extremity Skin: dry, intact, left arm is warm otherwise skin temperature is normal Psychiatry: normal mood, congruent affect, judgement and insight appear normal   Data Reviewed:  Labs reviewed and notable for glucose improved from 3 12-1 58.  Creatinine improved from 1.41-1.12.  BUN 26. Left elbow joint fluid with gram-positive cocci in WBCs  Family Communication: None at bedside on rounds.  Disposition: Status is: Inpatient  Remains inpatient appropriate because: Severity of illness on IV therapies and plan for surgical I&D         No charge  Author: Ezekiel Slocumb, DO 06/24/2021 2:01 PM  For on call review [email or web address].

## 2021-06-24 NOTE — H&P (Signed)
[place]   PATIENT NAME: Raymond [name]    MR#:  [id number]  DATE OF BIRTH:  [birth year]  DATE OF ADMISSION:  06/23/2021  PRIMARY CARE PHYSICIAN: [name], FNP   Patient is coming from: Home  REQUESTING/REFERRING PHYSICIAN: Ward, Cyril Mourning, DO  CHIEF COMPLAINT:   Chief Complaint  Patient presents with   left arm swelling    HISTORY OF PRESENT ILLNESS:  Clarke Peretz is a 61 y.o. African-American male with medical history significant for type II diabetes mellitus, hypertension and stage IIIa chronic kidney disease, status post 2 renal transplant who presented to the ER with acute onset of worsening left elbow swelling and pain that started about a week ago.  He was then seen in follow-up of acute gouty arthritis for which she was empirically given colchicine.  He later saw his primary care physician and was given an injection of IM Rocephin.  He stated that the pain is still continued and increased.  He has his left AV fistula at the same site.  No chest pain or dyspnea or cough or wheezing.  No fever or chills.  No nausea or vomiting or abdominal pain.  No dysuria, oliguria or hematuria or flank pain.  ED Course: When he came to the ER blood pressure was 133/98 with otherwise normal vital signs.  Labs revealed a blood glucose of 312 with a BUN of 30 and creatinine 1.41 compared to 20/1.3 on 11 05/2020.  CBC showed mild anemia compared to previous levels.  Influenza antigens and COVID-19 PCR came back negative.  Imaging: Left elbow x-ray showed posterior soft tissue swelling, joint effusion with no acute bony abnormality and stable chronic soft tissue densities and calcifications.  The patient was given IV Rocephin and vancomycin as well as 1 mg of IV Dilaudid twice and 4 mg of IV Zofran.  He underwent arthrocentesis by the ER physician and a culture was sent but the fluid was not enough for cell count.  Dr. Harlow Mares was notified about the patient.  He will be admitted to a  medical bed for further evaluation and management. PAST MEDICAL HISTORY:   Past Medical History:  Diagnosis Date   Chronic kidney disease    Awaiting transplant   Diabetes ([place])    Diabetes mellitus without complication ([place])    Hypertension    Renal disorder    Renal insufficiency     PAST SURGICAL HISTORY:   Past Surgical History:  Procedure Laterality Date   BONE BIOPSY Right 03/06/2021   Procedure: BONE BIOPSY;  Surgeon: Trula Slade, DPM;  Location: [facility];  Service: Podiatry;  Laterality: Right;   CENTRAL VENOUS CATHETER INSERTION Right 01/20/2020   Procedure: INSERTION CENTRAL LINE ADULT; Tunneled central line;  Surgeon: Virl Cagey, MD;  Location: [facility];  Service: General;  Laterality: Right;   COLONOSCOPY N/A 12/05/2014   CWU:GQBVQXIH external and internal hemorrhoid/mild diverticulosis/11 polyps removed   ESOPHAGOGASTRODUODENOSCOPY N/A 12/05/2014   WTU:UEKC duodenitis   GRAFT APPLICATION Right 00/34/9179   Procedure: GRAFT APPLICATION;  Surgeon: Trula Slade, DPM;  [facility]: [facility];  Service: Podiatry;  Laterality: Right;   IR FLUORO GUIDE CV LINE RIGHT  03/01/2019   IR REMOVAL TUN CV CATH W/O FL  05/10/2019   IR REMOVAL TUN CV CATH W/O FL  02/29/2020   IR US GUIDE VASC ACCESS RIGHT  03/01/2019   KIDNEY TRANSPLANT  03/27/2015   Penile Pump Insertion     TEE WITHOUT CARDIOVERSION N/A  01/17/2020   Procedure: TRANSESOPHAGEAL ECHOCARDIOGRAM (TEE) WITH PROPOFOL;  Surgeon: Arnoldo Lenis, MD;  [facility];  Service: Endoscopy;  Laterality: N/A;   WOUND DEBRIDEMENT Right 03/06/2021   Procedure: DEBRIDEMENT WOUND;  Surgeon: Trula Slade, DPM;  Location: [facility];  Service: Podiatry;  Laterality: Right;    SOCIAL HISTORY:   Social History   Tobacco Use   Smoking status: Never   Smokeless tobacco: Never  Substance Use Topics   Alcohol use: No    Alcohol/week: 0.0 standard drinks    FAMILY HISTORY:    Family History  Problem Relation Age of Onset   Diabetes Mother    Heart disease Father    Colon cancer Neg Hx     DRUG ALLERGIES:  No Known Allergies  REVIEW OF SYSTEMS:   ROS As per history of present illness. All pertinent systems were reviewed above. Constitutional, HEENT, cardiovascular, respiratory, GI, GU, musculoskeletal, neuro, psychiatric, endocrine, integumentary and hematologic systems were reviewed and are otherwise negative/unremarkable except for positive findings mentioned above in the HPI.   MEDICATIONS AT HOME:   Prior to Admission medications   Medication Sig Start Date End Date Taking? Authorizing Provider  blood glucose meter kit and supplies KIT Dispense based on patient and insurance preference. Use up to four times daily as directed. (FOR ICD-9 250.00, 250.01). 04/11/20  Yes [name], [name], MD  Blood Glucose Monitoring Suppl (ACCU-CHEK GUIDE ME) w/Device KIT 1 Piece by Does not apply route as directed. 02/18/18  Yes [name], [name], MD  cloNIDine (CATAPRES - DOSED IN MG/24 HR) 0.3 mg/24hr patch Place 0.3 mg onto the skin once a week.   Yes [provider]  colchicine 0.6 MG tablet Take 0.6 mg by mouth daily. 06/17/21  Yes [provider]  doxazosin (CARDURA) 4 MG tablet Take 6 mg by mouth daily. 12/18/15  Yes [provider]  Dulaglutide (TRULICITY) 1.47 WG/9.5AO SOPN Inject 0.75 mg into the skin once a week. 03/25/21  Yes Renato Shin, MD  glipiZIDE (GLUCOTROL XL) 5 MG 24 hr tablet Take 5 mg by mouth daily.    Yes [provider]  glucose blood (ACCU-CHEK GUIDE) test strip Use as instructed 4 x daily. E11.65 02/19/18  Yes [name], [name], MD  hydrALAZINE (APRESOLINE) 25 MG tablet Take 25 mg by mouth 2 (two) times daily.   Yes [provider]  HYDROcodone-acetaminophen (NORCO/VICODIN) 5-325 MG tablet Take 1 tablet by mouth every 6 (six) hours as needed. 03/06/21  Yes Trula Slade, DPM   Insulin NPH, Human,, Isophane, (NOVOLIN N FLEXPEN) 100 UNIT/ML Kiwkpen Inject 100 Units into the skin every morning. 03/05/21  Yes Renato Shin, MD  Multiple Vitamin (MULTIVITAMIN) capsule Take 1 capsule by mouth daily.   Yes [provider]  sodium bicarbonate 650 MG tablet Take 1,300 mg by mouth 3 (three) times daily. 11/02/18  Yes [provider]  tacrolimus (PROGRAF) 1 MG capsule Take 3 mg by mouth 2 (two) times daily. Take 3 capsules (70m) in AM & take 3 caps (346m in PM   Yes [provider]  Vitamin D, Ergocalciferol, (DRISDOL) 50000 units CAPS capsule Take 50,000 Units by mouth every 30 (thirty) days.   Yes [provider]  atorvastatin (LIPITOR) 10 MG tablet Take 1 tablet (10 mg total) by mouth daily. 03/14/21 06/12/21  MoMinette BrineFNP  Continuous Blood Gluc Receiver (FREESTYLE LIBRE 2 READER) DEVI As directed Patient not taking: Reported on 06/18/2021 04/24/20   [name],  [name], MD  Continuous Blood Gluc Sensor (FREESTYLE LIBRE 2 SENSOR) MISC 1 Piece by Does not apply route every 14 (fourteen) days. Patient not taking: Reported on 06/18/2021 04/24/20   [name], MD  silver sulfADIAZINE (SILVADENE) 1 % cream Apply 1 application topically daily. Patient not taking: Reported on 05/15/2021 01/31/21   Trula Slade, DPM  triamcinolone (KENALOG) 0.025 % ointment Apply 1 application topically 3 (three) times daily. Patient not taking: Reported on 05/15/2021 01/07/20   [provider]      VITAL SIGNS:  Blood pressure 131/85, pulse 74, temperature 98.5 F (36.9 C), temperature source Oral, resp. rate 17, height '5\' 8"'  (1.727 m), weight 104.3 kg, SpO2 95 %.  PHYSICAL EXAMINATION:  Physical Exam  GENERAL:  61 y.o.-year-old African-American male patient lying in the bed with no acute distress.  EYES: Pupils equal, round, reactive to light and accommodation. No scleral icterus. Extraocular muscles intact.  HEENT: Head  atraumatic, normocephalic. Oropharynx and nasopharynx clear.  NECK:  Supple, no jugular venous distention. No thyroid enlargement, no tenderness.  LUNGS: Normal breath sounds bilaterally, no wheezing, rales,rhonchi or crepitation. No use of accessory muscles of respiration.  CARDIOVASCULAR: Regular rate and rhythm, S1, S2 normal. No murmurs, rubs, or gallops.  ABDOMEN: Soft, nondistended, nontender. Bowel sounds present. No organomegaly or mass.  EXTREMITIES: No pedal edema, cyanosis, or clubbing.  NEUROLOGIC: Cranial nerves II through XII are intact. Muscle strength 5/5 in all extremities. Sensation intact. Gait not checked.  PSYCHIATRIC: The patient is alert and oriented x 3.  Normal affect and good eye contact. SKIN/musculoskeletal: Left elbow swelling with associated tenderness and extension of swelling to the dorsal right forearm and hand with minimal warmth and elbow tenderness LABORATORY PANEL:   CBC Recent Labs  Lab 06/23/21 2115  WBC 7.2  HGB 12.0*  HCT 38.7*  PLT 198   ------------------------------------------------------------------------------------------------------------------  Chemistries  Recent Labs  Lab 06/23/21 2115  NA 140  K 4.4  CL 111  CO2 23  GLUCOSE 312*  BUN 30*  CREATININE 1.41*  CALCIUM 9.4  AST 26  ALT 37  ALKPHOS 119  BILITOT 0.3   ------------------------------------------------------------------------------------------------------------------  Cardiac Enzymes No results for input(s): TROPONINI in the last 168 hours. ------------------------------------------------------------------------------------------------------------------  RADIOLOGY:  DG Elbow Complete Left  Result Date: 06/24/2021 CLINICAL DATA:  Left elbow pain and swelling. EXAM: LEFT ELBOW - COMPLETE 3+ VIEW COMPARISON:  Recent study 2 days ago 06/21/2021 FINDINGS: Circumferential soft tissue edema is again noted greatest posteriorly. There are positive fat pad signs again  seen consistent with a joint effusion. There is no evidence of fractures or erosive arthropathy with mild narrowing and trace spurring again at the trochleoulnar joint. There is no dislocation. Calcific plaques in the brachial artery, radial, ulnar and interosseous arteries are again shown. Subcutaneous soft tissue mass measuring 2.2 cm again noted in the antecubital fossa, with scattered coarse dystrophic calcifications. Another is seen in the ventrolateral upper arm measuring up to 5.5 cm and a third mass, also with dystrophic calcifications is partially visible just above this. All of these were seen previously. There have been no interval radiographic changes. IMPRESSION: 1. No fracture or destructive lesion is seen. 2. Calcific arteriosclerosis. 3. Again noted joint effusion. 4. Diffuse edema greater posteriorly with stable subcutaneous soft tissue masses with dystrophic calcifications. Electronically Signed   By: Telford Nab M.D.   On: 06/24/2021 00:09   US Venous Img Upper Uni Left  Result Date: 06/23/2021 CLINICAL DATA:  Left  upper extremity swelling, known arteriovenous fistula EXAM: LEFT UPPER EXTREMITY VENOUS DOPPLER ULTRASOUND TECHNIQUE: Gray-scale sonography with graded compression, as well as color Doppler and duplex ultrasound were performed to evaluate the upper extremity deep venous system from the level of the subclavian vein and including the jugular, axillary, basilic, radial, ulnar and upper cephalic vein. Spectral Doppler was utilized to evaluate flow at rest and with distal augmentation maneuvers. COMPARISON:  None. FINDINGS: Contralateral Subclavian Vein: Respiratory phasicity is normal and symmetric with the symptomatic side. No evidence of thrombus. Normal compressibility. Internal Jugular Vein: No evidence of thrombus. Normal compressibility, respiratory phasicity and response to augmentation. Subclavian Vein: No evidence of thrombus. Normal compressibility, respiratory phasicity  and response to augmentation. Axillary Vein: No evidence of thrombus. Normal compressibility, respiratory phasicity and response to augmentation. Cephalic Vein: No evidence of thrombus. Normal compressibility, respiratory phasicity and response to augmentation. Basilic Vein: No evidence of thrombus. Normal compressibility, respiratory phasicity and response to augmentation. Brachial Veins: No evidence of thrombus. Normal compressibility, respiratory phasicity and response to augmentation. Radial Veins: No evidence of thrombus. Normal compressibility, respiratory phasicity and response to augmentation. Ulnar Veins: No evidence of thrombus. Normal compressibility, respiratory phasicity and response to augmentation. Venous Reflux:  None visualized. 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 Mild dilatation is noted in its proximal segment. IMPRESSION: No evidence of DVT within the left upper extremity. Patent arteriovenous fistula. Electronically Signed   By: Inez Catalina M.D.   On: 06/23/2021 23:21      IMPRESSION AND PLAN:  Principal Problem:   Septic arthritis of elbow, left ([place])  1.  Left elbow arthritis concerning for possible septic arthritis with differential diagnoses include acute gouty arthritis. - The patient will be admitted to a medical bed. - We will continue antibiotic therapy with IV Rocephin and vancomycin. - Pain management will be provided. - Orthopedic consultation will be obtained. - Dr. Harlow Mares was notified about the patient. - We will continue his colchicine and check uric acid  2.  Uncontrolled type 2 diabetes mellitus with hyperglycemia. - The patient will be placed on supplement coverage with NovoLog and will continue his basal coverage as well as Glucotrol XL.  3.  Dyslipidemia. - We will continue statin therapy.  4.  Essential hypertension. - We will continue his antihypertensives.  5.  Status post renal transplant. - We will continue  Prograf.  DVT prophylaxis: Lovenox. Code Status: full code. Family Communication:  The plan of care was discussed in details with the patient (and family). I answered all questions. The patient agreed to proceed with the above mentioned plan. Further management will depend upon hospital course. Disposition Plan: Back to previous home environment Consults called: Orthopedic consult.  All the records are reviewed and case discussed with ED provider.  Status is: Inpatient  At the time of the admission, it appears that the appropriate admission status for this patient is inpatient.  This is judged to be reasonable and necessary in order to provide the required intensity of service to ensure the patient's safety given the presenting symptoms, physical exam findings and initial radiographic and laboratory data in the context of comorbid conditions.  The patient requires inpatient status due to high intensity of service, high risk of further deterioration and high frequency of surveillance required.  I certify that at the time of admission, it is my clinical judgment that the patient will require inpatient hospital care extending more than 2 midnights.  Dispo: The patient is from: Home              Anticipated d/c is to: Home              Patient currently is not medically stable to d/c.              Difficult to place patient: No   Christel Mormon M.D on 06/24/2021 at 3:48 AM  [facility]   From 7 PM-7 AM, contact night-coverage [email or web address]  CC: Primary care physician; [name], FNP

## 2021-06-24 NOTE — Anesthesia Preprocedure Evaluation (Addendum)
Anesthesia Evaluation  Patient identified by MRN, date of birth, ID band Patient awake    Reviewed: Allergy & Precautions, NPO status , Patient's Chart, lab work & pertinent test results  Airway Mallampati: III  TM Distance: >3 FB Neck ROM: full    Dental  (+) Chipped   Pulmonary sleep apnea ,    Pulmonary exam normal        Cardiovascular Exercise Tolerance: Poor hypertension, Pt. on medications + Peripheral Vascular Disease  Normal cardiovascular exam     Neuro/Psych negative neurological ROS  negative psych ROS   GI/Hepatic Neg liver ROS, Pt denies symptoms    Endo/Other  diabetes, Poorly Controlled, Type 2, Insulin DependentA1c in October 15.5%  Renal/GU CRFRenal disease (Former ESRD with HD s/p transplant, left AV fistula in place)     Musculoskeletal  (+) Arthritis  (gouty arthritis ),   Abdominal   Peds  Hematology negative hematology ROS (+)   Anesthesia Other Findings Left elbow septic arthritis  Pt hypoglycemic in the pre-op and denies symptoms but is drowsy.   Past Medical History: No date: Chronic kidney disease     Comment:  Awaiting transplant No date: Diabetes ([place]) No date: Diabetes mellitus without complication ([place]) No date: Hypertension No date: Renal disorder No date: Renal insufficiency  Past Surgical History: 03/06/2021: BONE BIOPSY; Right     Comment:  Procedure: BONE BIOPSY;  Surgeon: Trula Slade,               DPM;  Location: [facility];  Service: Podiatry;  Laterality:               Right; 01/20/2020: CENTRAL VENOUS CATHETER INSERTION; Right     Comment:  Procedure: INSERTION CENTRAL LINE ADULT; Tunneled               central line;  Surgeon: Virl Cagey, MD;                Location: [facility];  Service: General;  Laterality: Right; 12/05/2014: COLONOSCOPY; N/A     Comment:  AGT:XMIWOEHO external and internal hemorrhoid/mild               diverticulosis/11 polyps  removed 12/05/2014: ESOPHAGOGASTRODUODENOSCOPY; N/A     Comment:  ZYY:QMGN duodenitis 00/37/0488: GRAFT APPLICATION; Right     Comment:  Procedure: GRAFT APPLICATION;  Surgeon: Trula Slade, DPM;  [facility];  Service: Podiatry;                Laterality: Right; 03/01/2019: IR FLUORO GUIDE CV LINE RIGHT 05/10/2019: IR REMOVAL TUN CV CATH W/O FL 02/29/2020: IR REMOVAL TUN CV CATH W/O FL 03/01/2019: IR US GUIDE VASC ACCESS RIGHT 03/27/2015: KIDNEY TRANSPLANT No date: Penile Pump Insertion 01/17/2020: TEE WITHOUT CARDIOVERSION; N/A     Comment:  Procedure: TRANSESOPHAGEAL ECHOCARDIOGRAM (TEE) WITH               PROPOFOL;  Surgeon: Arnoldo Lenis, MD;  [facility];  Service: Endoscopy;  Laterality: N/A; 03/06/2021: WOUND DEBRIDEMENT; Right     Comment:  Procedure: DEBRIDEMENT WOUND;  Surgeon: Trula Slade, DPM;  [facility];  Service: Podiatry;                Laterality:  Right;  BMI    Body Mass Index: 34.97 kg/m      Reproductive/Obstetrics negative OB ROS                           Anesthesia Physical Anesthesia Plan  ASA: 3  Anesthesia Plan: General LMA   Post-op Pain Management:    Induction: Intravenous  PONV Risk Score and Plan: 2 and Dexamethasone, Ondansetron and Treatment may vary due to age or medical condition  Airway Management Planned: LMA  Additional Equipment:   Intra-op Plan:   Post-operative Plan: Extubation in OR  Informed Consent: I have reviewed the patients History and Physical, chart, labs and discussed the procedure including the risks, benefits and alternatives for the proposed anesthesia with the patient or authorized representative who has indicated his/her understanding and acceptance.     Dental Advisory Given  Plan Discussed with: Anesthesiologist, CRNA and Surgeon  Anesthesia Plan Comments: (Pt has BG 40's with slight somnolence on  exam. Will treat with D50 25mg  and proceed to OR when BG in normal and pt is not somnolent. )       Anesthesia Quick Evaluation

## 2021-06-24 NOTE — Hospital Course (Signed)
Same day as admission rounding note. See H&P by Dr. Sidney Ace.     I agree with the assessment and planned as outlined in H&P with any exceptions/changes as below.     Patient admitted with left elbow pain and swelling.  Found to have septic joint.   Ortho is consulted, plan for OR for I&D.

## 2021-06-24 NOTE — ED Notes (Signed)
Rn to bedside to introduce self to pt. Pt CAOx4 and sitting on stool watching TV. In no distress. Pt then ambulated to bed,

## 2021-06-25 ENCOUNTER — Other Ambulatory Visit: Payer: BC Managed Care – PPO

## 2021-06-25 ENCOUNTER — Encounter: Payer: Self-pay | Admitting: Orthopedic Surgery

## 2021-06-25 DIAGNOSIS — M00222 Other streptococcal arthritis, left elbow: Principal | ICD-10-CM

## 2021-06-25 DIAGNOSIS — N182 Chronic kidney disease, stage 2 (mild): Secondary | ICD-10-CM

## 2021-06-25 DIAGNOSIS — E1122 Type 2 diabetes mellitus with diabetic chronic kidney disease: Secondary | ICD-10-CM

## 2021-06-25 DIAGNOSIS — Z794 Long term (current) use of insulin: Secondary | ICD-10-CM

## 2021-06-25 DIAGNOSIS — Z940 Kidney transplant status: Secondary | ICD-10-CM

## 2021-06-25 LAB — GLUCOSE, CAPILLARY
Glucose-Capillary: 37 mg/dL — CL (ref 70–99)
Glucose-Capillary: 45 mg/dL — ABNORMAL LOW (ref 70–99)
Glucose-Capillary: 66 mg/dL — ABNORMAL LOW (ref 70–99)
Glucose-Capillary: 173 mg/dL — ABNORMAL HIGH (ref 70–99)
Glucose-Capillary: 161 mg/dL — ABNORMAL HIGH (ref 70–99)
Glucose-Capillary: 150 mg/dL — ABNORMAL HIGH (ref 70–99)

## 2021-06-25 LAB — CBC
HCT: 37.5 % — ABNORMAL LOW (ref 39.0–52.0)
Hemoglobin: 11.4 g/dL — ABNORMAL LOW (ref 13.0–17.0)
MCH: 24.6 pg — ABNORMAL LOW (ref 26.0–34.0)
MCHC: 30.4 g/dL (ref 30.0–36.0)
MCV: 80.8 fL (ref 80.0–100.0)
Platelets: 189 10*3/uL (ref 150–400)
RBC: 4.64 MIL/uL (ref 4.22–5.81)
RDW: 15.3 % (ref 11.5–15.5)
WBC: 8.4 10*3/uL (ref 4.0–10.5)
nRBC: 0 % (ref 0.0–0.2)

## 2021-06-25 LAB — MAGNESIUM: Magnesium: 1.4 mg/dL — ABNORMAL LOW (ref 1.7–2.4)

## 2021-06-25 LAB — BASIC METABOLIC PANEL
Anion gap: 6 (ref 5–15)
BUN: 18 mg/dL (ref 6–20)
CO2: 21 mmol/L — ABNORMAL LOW (ref 22–32)
Calcium: 8.8 mg/dL — ABNORMAL LOW (ref 8.9–10.3)
Chloride: 112 mmol/L — ABNORMAL HIGH (ref 98–111)
Creatinine, Ser: 1.03 mg/dL (ref 0.61–1.24)
GFR, Estimated: >60 mL/min (ref >60)
Glucose, Bld: 51 mg/dL — ABNORMAL LOW (ref 70–99)
Potassium: 4.3 mmol/L (ref 3.5–5.1)
Sodium: 139 mmol/L (ref 135–145)

## 2021-06-25 MED ORDER — HYDRALAZINE HCL 50 MG PO TABS
50.0000 mg | ORAL_TABLET | Freq: Three times a day (TID) | ORAL | Status: DC
  Administered 2021-06-25 – 2021-06-27 (×7): 50 mg via ORAL
  Filled 2021-06-25 (×7): qty 1

## 2021-06-25 MED ORDER — INSULIN NPH (HUMAN) (ISOPHANE) 100 UNIT/ML ~~LOC~~ SUSP
50.0000 [IU] | Freq: Every day | SUBCUTANEOUS | Status: DC
  Administered 2021-06-26 – 2021-06-27 (×2): 50 [IU] via SUBCUTANEOUS
  Filled 2021-06-25: qty 10

## 2021-06-25 MED ORDER — MAGNESIUM SULFATE 4 GM/100ML IV SOLN
4.0000 g | Freq: Once | INTRAVENOUS | Status: AC
  Administered 2021-06-25: 4 g via INTRAVENOUS
  Filled 2021-06-25: qty 100

## 2021-06-25 NOTE — Assessment & Plan Note (Signed)
Mg 1.4 this AM, replacing. Monitor and replace PRN.

## 2021-06-25 NOTE — Anesthesia Postprocedure Evaluation (Addendum)
Anesthesia Post Note  Patient: Raymond [name]  Procedure(s) Performed: IRRIGATION AND DEBRIDEMENT ELBOW (Left)  Patient location during evaluation: PACU Anesthesia Type: General Level of consciousness: awake and alert Pain management: pain level controlled Vital Signs Assessment: post-procedure vital signs reviewed and stable Respiratory status: spontaneous breathing, nonlabored ventilation and respiratory function stable Cardiovascular status: blood pressure returned to baseline and stable Postop Assessment: no apparent nausea or vomiting Anesthetic complications: no Comments: Hypoglycemia treated with D50 25mg  with response.    No notable events documented.   Last Vitals:  Vitals:   06/25/21 1641 06/25/21 2015  BP: (!) 182/83 (!) 195/84  Pulse: 68 71  Resp: 18 16  Temp: 36.9 C 37.1 C  SpO2: 100% 100%    Last Pain:  Vitals:   06/25/21 1625  TempSrc:   PainSc: [place]

## 2021-06-25 NOTE — Treatment Plan (Signed)
Diagnosis: Left elbow septic arthritis  Baseline Creatinine 1.3  Culture Result: Group B streptococcus  No Known Allergies  OPAT Orders Discharge antibiotics: Ceftriaxone 2 grms IV every 24 hours until 07/19/21 Duration: 4 weeks End Date: 07/19/21  PIC/central line Care Per Protocol:  Labs weekly while on IV antibiotics: _X_ CBC with differential  _X_ CMP  _X_ ESR   _X_ Please pull central line  at completion of IV antibiotics- at Intervention radiology with radiology on 07/19/21 to remove the line   Fax weekly labs to [place] [phone]  Clinic Follow Up Appt: 07/18/21 at 8.30 AM   Call [phone] any questions

## 2021-06-25 NOTE — Consult Note (Addendum)
NAME: Raymond [name]  DOB: December 02, 1960  MRN: [id number]  Date/Time: 06/25/2021 2:19 PM  REQUESTING PROVIDER: Dr.Griffith Subjective:  REASON FOR CONSULT: septic arthritis left elbow ? Raymond [name] is a 61 y.o.male  with a history of kidney transplant in 2016, HTN, DM, chronic wound rt foot  , plantar surface presents to the ED with left elbow swelling of 1 week duration- pt says he first developed sever epain and swellign on 1/23/233 and went to [facility] ED and was diagnosed as gout and given pain meds and colchicine.  X-ray was done and did not show any fracture.-He saw his PCP the next day and she thought he could have cellulitis and gave him 1 injection of ceftriaxone 5 mg intramuscular..  As the pain got worse he came to ED on 06/23/21 In the ED vitals BP 133/98, temperature 98.5, heart rate 72, respiratory 16 and sats 94%. WBC 7.2, Hb 12, platelet 198 and creatinine of 1.41. He did have aspiration of the left elbow fluid and it showed urate crystals.  Culture was sent.  He was taken to the OR by Dr. Harlow Mares and had washout of the left elbow joint. Am asked to see the patient for antibiotic management The culture is positive for group B streptococcus The patient has been on vancomycin and ceftriaxone.  The Vanco has been stopped Patient denies any trauma to the elbow Patient has had chronic right plantar wound and has been followed by podiatrist.  He has also had left foot infection in the past.  He also had seen infectious disease since 2020 and has been on multiple courses of antibiotics including intravenous antibiotics for osteomyelitis.  Between September until December 2020 he had received daptomycin for Enterococcus in the left foot osteo and it healed. In August 2020 when he was hospitalized with MRSA bacteremia and during that hospitalization had right foot infection.  He had not received daptomycin for 6 weeks. That in October 2022 he was again seen by ID and treated for right foot  chronic ulcer with oral linezolid and p.o. Augmentin. He is also followed at the wound clinic and last saw them on 06/12/2021 when the right plantar callus wound was almost closed. Past medical history Status post renal transplant End-stage renal disease Diabetes mellitus Hypertension   Past Surgical History:  Procedure Laterality Date   BONE BIOPSY Right 03/06/2021   Procedure: BONE BIOPSY;  Surgeon: Trula Slade, DPM;  Location: [facility];  Service: Podiatry;  Laterality: Right;   CENTRAL VENOUS CATHETER INSERTION Right 01/20/2020   Procedure: INSERTION CENTRAL LINE ADULT; Tunneled central line;  Surgeon: Virl Cagey, MD;  Location: [facility];  Service: General;  Laterality: Right;   COLONOSCOPY N/A 12/05/2014   [id number] external and internal hemorrhoid/mild diverticulosis/11 polyps removed   ESOPHAGOGASTRODUODENOSCOPY N/A 12/05/2014   ZTI:[id number] duodenitis   GRAFT APPLICATION Right 99/83/3825   Procedure: GRAFT APPLICATION;  Surgeon: Trula Slade, DPM;  Location: [facility];  Service: Podiatry;  Laterality: Right;   IR FLUORO GUIDE CV LINE RIGHT  03/01/2019   IR REMOVAL TUN CV CATH W/O FL  05/10/2019   IR REMOVAL TUN CV CATH W/O FL  02/29/2020   IR US GUIDE VASC ACCESS RIGHT  03/01/2019   IRRIGATION AND DEBRIDEMENT ELBOW Left 06/24/2021   Procedure: IRRIGATION AND DEBRIDEMENT ELBOW;  Surgeon: Lovell Sheehan, MD;  Location: [facility] ORS;  Service: Orthopedics;  Laterality: Left;   KIDNEY TRANSPLANT  03/27/2015   Penile Pump Insertion  TEE WITHOUT CARDIOVERSION N/A 01/17/2020   Procedure: TRANSESOPHAGEAL ECHOCARDIOGRAM (TEE) WITH PROPOFOL;  Surgeon: Arnoldo Lenis, MD;  Location: [facility];  Service: Endoscopy;  Laterality: N/A;   WOUND DEBRIDEMENT Right 03/06/2021   Procedure: DEBRIDEMENT WOUND;  Surgeon: Trula Slade, DPM;  Location: [facility];  Service: Podiatry;  Laterality: Right;    Social History   Socioeconomic History   Marital status: Married     Spouse name: Not on file   Number of children: Not on file   Years of education: Not on file   Highest education level: Not on file  Occupational History   Not on file  Tobacco Use   Smoking status: Never   Smokeless tobacco: Never  Vaping Use   Vaping Use: Never used  Substance and Sexual Activity   Alcohol use: No    Alcohol/week: 0.0 standard drinks   Drug use: No   Sexual activity: Not on file  Other Topics Concern   Not on file  Social History Narrative   ** Merged History Encounter **       Social Determinants of Health   Financial Resource Strain: Not on file  Food Insecurity: Not on file  Transportation Needs: Not on file  Physical Activity: Not on file  Stress: Not on file  Social Connections: Not on file  Intimate Partner Violence: Not on file    Family History  Problem Relation Age of Onset   Diabetes Mother    Heart disease Father    Colon cancer Neg Hx    No Known Allergies I? Current Facility-Administered Medications  Medication Dose Route Frequency Provider Last Rate Last Admin   0.9 %  sodium chloride infusion   Intravenous Continuous Nicole Kindred A, DO 50 mL/hr at 06/25/21 1015 Rate Change at 06/25/21 1015   acetaminophen (TYLENOL) tablet 325-650 mg  325-650 mg Oral Q6H PRN Lovell Sheehan, MD       atorvastatin (LIPITOR) tablet 10 mg  10 mg Oral Daily Lovell Sheehan, MD   10 mg at 06/25/21 1007   cefTRIAXone (ROCEPHIN) 2 g in sodium chloride 0.9 % 100 mL IVPB  2 g Intravenous Q24H Lovell Sheehan, MD 200 mL/hr at 06/25/21 9735 2 g at 06/25/21 3299   Chlorhexidine Gluconate Cloth 2 % PADS 6 each  6 each Topical Q0600 Lovell Sheehan, MD       cloNIDine (CATAPRES - Dosed in mg/24 hr) patch 0.3 mg  0.3 mg Transdermal Weekly Lovell Sheehan, MD       colchicine tablet 0.6 mg  0.6 mg Oral Daily Lovell Sheehan, MD   0.6 mg at 06/25/21 1007   docusate sodium (COLACE) capsule 100 mg  100 mg Oral BID Lovell Sheehan, MD   100 mg at 06/25/21 1007    doxazosin (CARDURA) tablet 6 mg  6 mg Oral Daily Lovell Sheehan, MD   6 mg at 06/25/21 1007   enoxaparin (LOVENOX) injection 52.5 mg  0.5 mg/kg Subcutaneous Q24H Lovell Sheehan, MD   52.5 mg at 06/25/21 2426   hydrALAZINE (APRESOLINE) tablet 50 mg  50 mg Oral TID Nicole Kindred A, DO   50 mg at 06/25/21 1007   HYDROcodone-acetaminophen (NORCO) 7.5-325 MG per tablet 1-2 tablet  1-2 tablet Oral Q4H PRN Lovell Sheehan, MD   2 tablet at 06/25/21 1007   HYDROcodone-acetaminophen (NORCO/VICODIN) 5-325 MG per tablet 1-2 tablet  1-2 tablet Oral Q4H PRN Lovell Sheehan, MD  insulin aspart (novoLOG) injection 0-9 Units  0-9 Units Subcutaneous TID WC Lovell Sheehan, MD   2 Units at 06/25/21 1210   [START ON 06/26/2021] insulin NPH Human (NOVOLIN N) injection 50 Units  50 Units Subcutaneous QAC breakfast Nicole Kindred A, DO       magnesium hydroxide (MILK OF MAGNESIA) suspension 30 mL  30 mL Oral Daily PRN Lovell Sheehan, MD       metoCLOPramide (REGLAN) tablet 5-10 mg  5-10 mg Oral Q8H PRN Lovell Sheehan, MD       Or   metoCLOPramide (REGLAN) injection 5-10 mg  5-10 mg Intravenous Q8H PRN Lovell Sheehan, MD       morphine 2 MG/ML injection 0.5-1 mg  0.5-1 mg Intravenous Q2H PRN Lovell Sheehan, MD       multivitamin with minerals tablet 1 tablet  1 tablet Oral Daily Lovell Sheehan, MD   1 tablet at 06/25/21 1007   mupirocin ointment (BACTROBAN) 2 % 1 application  1 application Nasal BID Lovell Sheehan, MD   1 application at 66/44/03 1009   ondansetron (ZOFRAN) tablet 4 mg  4 mg Oral Q6H PRN Lovell Sheehan, MD       Or   ondansetron [facility]) injection 4 mg  4 mg Intravenous Q6H PRN Lovell Sheehan, MD       sodium bicarbonate tablet 1,300 mg  1,300 mg Oral TID Lovell Sheehan, MD   1,300 mg at 06/25/21 1008   tacrolimus (PROGRAF) capsule 3 mg  3 mg Oral BID Lovell Sheehan, MD   3 mg at 06/25/21 1007   traZODone (DESYREL) tablet 25 mg  25 mg Oral QHS PRN Lovell Sheehan, MD   25 mg at 06/24/21  2358   Vitamin D (Ergocalciferol) (DRISDOL) capsule 50,000 Units  50,000 Units Oral Q30 days Lovell Sheehan, MD         Abtx:  Anti-infectives (From admission, onward)    Start     Dose/Rate Route Frequency Ordered Stop   06/25/21 0600  cefTRIAXone (ROCEPHIN) 2 g in sodium chloride 0.9 % 100 mL IVPB        2 g 200 mL/hr over 30 Minutes Intravenous Every 24 hours 06/24/21 0348 07/01/21 0559   06/24/21 0300  vancomycin (VANCOREADY) IVPB 2000 mg/400 mL        2,000 mg 200 mL/hr over 120 Minutes Intravenous  Once 06/24/21 0259 06/24/21 0527   06/24/21 0130  cefTRIAXone (ROCEPHIN) 2 g in sodium chloride 0.9 % 100 mL IVPB        2 g 200 mL/hr over 30 Minutes Intravenous  Once 06/24/21 0117 06/24/21 0237       REVIEW OF SYSTEMS:  Const: negative fever, negative chills, negative weight loss Eyes: negative diplopia or visual changes, negative eye pain ENT: negative coryza, negative sore throat Resp: negative cough, hemoptysis, dyspnea Cards: negative for chest pain, palpitations, lower extremity edema GU: negative for frequency, dysuria and hematuria GI: Negative for abdominal pain, diarrhea, bleeding, constipation Skin: negative for rash and pruritus Heme: negative for easy bruising and gum/nose bleeding MS: As above Neurolo:negative for headaches, dizziness, vertigo, memory problems  Psych: negative for feelings of anxiety, depression  Endocrine: , diabetes Allergy/Immunology- negative for any medication or food allergies ?  Objective:  VITALS:  BP (!) 161/82 (BP Location: Right Arm)    Pulse 65    Temp 97.8 F (36.6 C) (Oral)    Resp 18  Ht 5\' 7"  (1.702 m)    Wt 104.3 kg    SpO2 100%    BMI 36.02 kg/m  PHYSICAL EXAM:  General: Alert, cooperative, no distress, appears stated age.  Head: Normocephalic, without obvious abnormality, atraumatic. Eyes: Conjunctivae clear, anicteric sclerae. Pupils are equal ENT Nares normal. No drainage or sinus tenderness. Lips, mucosa, and  tongue normal. No Thrush Neck: Supple, symmetrical, no adenopathy, thyroid: non tender no carotid bruit and no JVD. Back: No CVA tenderness. Lungs: Clear to auscultation bilaterally. No Wheezing or Rhonchi. No rales. Heart: Regular rate and rhythm, no murmur, rub or gallop. Abdomen: Soft, non-tender,not distended. Bowel sounds normal. No masses Extremities: Left arm /elbow surgical dressing not removed  Right foot callus ulcer on the plantar aspect of the first MTP area is about 0.3 cm. skin: No rashes or lesions. Or bruising Lymph: Cervical, supraclavicular normal. Neurologic: Grossly non-focal Pertinent Labs Lab Results CBC    Component Value Date/Time   WBC 8.4 06/25/2021 0352   RBC 4.64 06/25/2021 0352   HGB 11.4 (L) 06/25/2021 0352   HGB 14.2 02/28/2021 1006   HCT 37.5 (L) 06/25/2021 0352   HCT 47.5 02/28/2021 1006   PLT 189 06/25/2021 0352   PLT 150 02/28/2021 1006   MCV 80.8 06/25/2021 0352   MCV 84 02/28/2021 1006   MCV 87 03/13/2014 1049   MCH 24.6 (L) 06/25/2021 0352   MCHC 30.4 06/25/2021 0352   RDW 15.3 06/25/2021 0352   RDW 12.2 02/28/2021 1006   RDW 16.0 (H) 03/13/2014 1049   LYMPHSABS 1.6 06/23/2021 2115   LYMPHSABS 1.2 12/11/2020 1005   LYMPHSABS 1.9 09/07/2013 1435   MONOABS 0.7 06/23/2021 2115   MONOABS 0.4 09/07/2013 1435   EOSABS 0.2 06/23/2021 2115   EOSABS 0.1 12/11/2020 1005   EOSABS 0.2 09/07/2013 1435   BASOSABS 0.0 06/23/2021 2115   BASOSABS 0.0 12/11/2020 1005   BASOSABS 0.0 09/07/2013 1435    CMP Latest Ref Rng & Units 06/25/2021 06/24/2021 06/23/2021  Glucose 70 - 99 mg/dL 51(L) 158(H) 312(H)  BUN 6 - 20 mg/dL 18 26(H) 30(H)  Creatinine 0.61 - 1.24 mg/dL 1.03 1.12 1.41(H)  Sodium 135 - 145 mmol/L 139 139 140  Potassium 3.5 - 5.1 mmol/L 4.3 4.5 4.4  Chloride 98 - 111 mmol/L 112(H) 109 111  CO2 22 - 32 mmol/L 21(L) 21(L) 23  Calcium 8.9 - 10.3 mg/dL 8.8(L) 9.6 9.4  Total Protein 6.5 - 8.1 g/dL - - 7.7  Total Bilirubin 0.3 - 1.2 mg/dL - -  0.3  Alkaline Phos 38 - 126 U/L - - 119  AST 15 - 41 U/L - - 26  ALT 0 - 44 U/L - - 37      Microbiology: Recent Results (from the past 240 hour(s))  Body fluid culture w Gram Stain     Status: None (Preliminary result)   Collection Time: 06/24/21  1:04 AM   Specimen: Joint, Elbow; Body Fluid  Result Value Ref Range Status   Specimen Description   Final    JOINT FLUID ELBOW Performed at [facility], [address] [place], [place] [address]    Special Requests   Final    JOINT FLUID ELBOW Performed at [facility], [address] [place], [place] [address]    Gram Stain   Final    GRAM POSITIVE COCCI WBC SEEN RED BLOOD CELLS CRITICAL RESULT CALLED TO, READ BACK BY AND VERIFIED WITH: TONNY WALKER AT 0248 ON 06/24/21 BY SS  GRAM STAIN REVIEWED-AGREE WITH RESULT    Culture   Final    RARE GROUP B STREP(S.AGALACTIAE)ISOLATED TESTING AGAINST S. AGALACTIAE NOT ROUTINELY PERFORMED DUE TO PREDICTABILITY OF AMP/PEN/VAN SUSCEPTIBILITY. Performed at [facility], [place] [address] [place], [place] [address]    Report Status PENDING  Incomplete  Gram stain     Status: None   Collection Time: 06/24/21  1:04 AM  Result Value Ref Range Status   Specimen Description JOINT FLUID ELBOW  Final   Special Requests JOINT FLUID ELBOW  Final   Gram Stain   Final    GRAM POSITIVE COCCI WBC SEEN RED BLOOD CELLS CRITICAL RESULT CALLED TO, READ BACK BY AND VERIFIED WITH: TONNY WALKER AT 8242 ON 06/24/21 BY SS Performed at [facility] [address] [place], [place] [address]    Report Status 06/24/2021 FINAL  Final  Culture, blood (Routine X 2) w Reflex to ID Panel     Status: None (Preliminary result)   Collection Time: 06/24/21  2:58 AM   Specimen: BLOOD  Result Value Ref Range Status   Specimen Description BLOOD BLOOD RIGHT FOREARM  Final   Special Requests   Final    BOTTLES DRAWN AEROBIC AND ANAEROBIC Blood Culture adequate volume   Culture    Final    NO GROWTH 1 DAY Performed at [facility], [address], [place], [place] [address]    Report Status PENDING  Incomplete  Resp Panel by RT-PCR (Flu A&B, Covid) Nasopharyngeal Swab     Status: None   Collection Time: 06/24/21  3:02 AM   Specimen: Nasopharyngeal Swab; Nasopharyngeal(NP) swabs in vial transport medium  Result Value Ref Range Status   SARS Coronavirus 2 by RT PCR NEGATIVE NEGATIVE Final    Comment: (NOTE) SARS-CoV-2 target nucleic acids are NOT DETECTED.  The SARS-CoV-2 RNA is generally detectable in upper respiratory specimens during the acute phase of infection. The lowest concentration of SARS-CoV-2 viral copies this assay can detect is 138 copies/mL. A negative result does not preclude SARS-Cov-2 infection and should not be used as the sole basis for treatment or other patient management decisions. A negative result may occur with  improper specimen collection/handling, submission of specimen other than nasopharyngeal swab, presence of viral mutation(s) within the areas targeted by this assay, and inadequate number of viral copies(<138 copies/mL). A negative result must be combined with clinical observations, patient history, and epidemiological information. The expected result is Negative.  Fact Sheet for Patients:  EntrepreneurPulse.com.au  Fact Sheet for Healthcare Providers:  IncredibleEmployment.be  This test is no t yet approved or cleared by the [place] FDA and  has been authorized for detection and/or diagnosis of SARS-CoV-2 by FDA under an Emergency Use Authorization (EUA). This EUA will remain  in effect (meaning this test can be used) for the duration of the COVID-19 declaration under Section 564(b)(1) of the Act, 21 U.S.C.section 360bbb-3(b)(1), unless the authorization is terminated  or revoked sooner.       Influenza A by PCR NEGATIVE NEGATIVE Final   Influenza B by PCR NEGATIVE  NEGATIVE Final    Comment: (NOTE) The Xpert Xpress SARS-CoV-2/FLU/RSV plus assay is intended as an aid in the diagnosis of influenza from Nasopharyngeal swab specimens and should not be used as a sole basis for treatment. Nasal washings and aspirates are unacceptable for Xpert Xpress SARS-CoV-2/FLU/RSV testing.  Fact Sheet for Patients: EntrepreneurPulse.com.au  Fact Sheet for Healthcare Providers: IncredibleEmployment.be  This test is not yet  approved or cleared by the [place] and has been authorized for detection and/or diagnosis of SARS-CoV-2 by FDA under an Emergency Use Authorization (EUA). This EUA will remain in effect (meaning this test can be used) for the duration of the COVID-19 declaration under Section 564(b)(1) of the Act, 21 U.S.C. section 360bbb-3(b)(1), unless the authorization is terminated or revoked.  Performed at [facility], [address] [place], [place] [address]   Culture, blood (Routine X 2) w Reflex to ID Panel     Status: None (Preliminary result)   Collection Time: 06/24/21  3:08 AM   Specimen: BLOOD  Result Value Ref Range Status   Specimen Description BLOOD BLOOD RIGHT WRIST  Final   Special Requests   Final    BOTTLES DRAWN AEROBIC AND ANAEROBIC Blood Culture adequate volume   Culture   Final    NO GROWTH 1 DAY Performed at [facility], [address], [place], [place] [address]    Report Status PENDING  Incomplete  Aerobic/Anaerobic Culture w Gram Stain (surgical/deep wound)     Status: None (Preliminary result)   Collection Time: 06/24/21  5:44 PM   Specimen: PATH Other; Wound  Result Value Ref Range Status   Specimen Description   Final    ARM Performed at [facility], [address], [place], [place] [address]    Special Requests   Final    NONE Performed at [name], [place], [place] [address]    Gram Stain   Final    NO  SQUAMOUS EPITHELIAL CELLS SEEN FEW WBC SEEN NO ORGANISMS SEEN Performed at [facility], [place] [address] [place], [place] [address]    Culture PENDING  Incomplete   Report Status PENDING  Incomplete    IMAGING RESULTS: I have personally reviewed the films ? Impression/Recommendation  left elbow septic arthritis due to group B streptococcus.  Patient is currently on Vanco and ceftriaxone.  Vanco can be discontinued.  He will need 4 weeks of IV ceftriaxone. Acute gouty arthritis of the left elbow   History of end-stage renal disease status post renal transplant and creatinine is much improved On tacrolimus ? _Diabetes mellitus on sliding scale  Hypertension on clonidine, doxazosin and hydralazine.  __________________________________________________ Discussed with patient, requesting provider Note:  This document was prepared using Dragon voice recognition software and may include unintentional dictation errors.

## 2021-06-25 NOTE — TOC Progression Note (Signed)
Transition of Care [facility]) - Progression Note    Patient Details  Name: Raymond [name] MRN: [id number] Date of Birth: 1960/06/26  Transition of Care [facility]) CM/SW [place], RN Phone Number: 06/25/2021, 4:51 PM  Clinical Narrative:   Reached out to jason at [name] to provide referral for [facility] services, Corene Cornea will let me know if they can accept the patient,          Expected Discharge Plan and Services                                                 Social Determinants of Health (SDOH) Interventions    Readmission Risk Interventions No flowsheet data found.

## 2021-06-25 NOTE — Progress Notes (Addendum)
°  Progress Note   Patient: Raymond [name] UJW:[id number] DOB: 1961-01-18 DOA: 06/23/2021     1 DOS: the patient was seen and examined on 06/25/2021   Brief hospital course: Same day as admission rounding note. See H&P by Dr. Sidney Ace.     I agree with the assessment and planned as outlined in H&P with any exceptions/changes as below.     Patient admitted with left elbow pain and swelling.  Found to have septic joint.   Ortho is consulted, plan for OR for I&D.  Assessment and Plan: * Septic arthritis of elbow, left ([place])- (present on admission) Onset one week prior to presentation.  Presumed initially to be gout but did not respond to NSAID's outpatient. Joint aspirated in the ED, fluid culture with RARE GROUP STREP (S.AGALACTIAE). Blood cultures negative. --Ortho - recommendations: Follow up in clinic in 2 weeks for suture removal.  Dressing change tomorrow --ID consulted for antibiotic recs --Pain control PRN  Type 2 diabetes mellitus ([place]) Uncontrolled with last A1c in October 15.5%.  Repeat this admission 10.6%.  Home regimen is NPH Insulin 562 units Daily,Trulicity weekly, glipizide 5 mg Daily --Diabetes coordinator following --Sliding scale Novolog --Continue NPH, reduce to 50 units given hypoglycemic --Hold Trulicity, not on formulary --Stop glipizide (hypoglycemic episodes)   Hypomagnesemia Mg 1.4 this AM, replacing. Monitor and replace PRN.        Subjective: Patient was seated edge of bed eating lunch when seen today.  He reports ongoing severe left elbow pain.  He states the best pain score he has had even after medications is 8 out of 10.  He has no other acute complaints including fevers or chills.  Physical Exam: Vitals:   06/24/21 2351 06/25/21 0452 06/25/21 0816 06/25/21 1641  BP: (!) 170/81 (!) 161/76 (!) 161/82 (!) 182/83  Pulse: 67 66 65 68  Resp: 18 18 18 18   Temp: 98.6 F (37 C) 98.5 F (36.9 C) 97.8 F (36.6 C) 98.5 F (36.9 C)  TempSrc:    Oral   SpO2: 99% 100% 100% 100%  Weight:      Height:       General exam: awake, alert, no acute distress, obese HEENT: moist mucus membranes, hearing grossly normal  Respiratory system: CTAB, no wheezes, rales or rhonchi, normal respiratory effort. Cardiovascular system: normal S1/S2, RRR, no peripheral edema, LUE fistula in place.   Gastrointestinal system: protuberant abdomen, NT Central nervous system: A&O x3. no gross focal neurologic deficits, normal speech Extremities: left arm in ACE bandage, no edema, normal tone Skin: dry, intact, normal temperature Psychiatry: normal mood, congruent affect, judgement and insight appear normal   Data Reviewed:  Cl 112, CO2 21, GLUCOSE 51 (later CBG 37 >> 45 >>66>>173), Hbg 11.4, MAGNESIUM 1.4  Family Communication: none at bedside  Disposition: Status is: Inpatient Remains inpatient appropriate because: on IV antibiotics, may require long course IV therapy  Planned Discharge Destination: Home           Time spent: 35 minutes  Author: Ezekiel Slocumb, DO 06/25/2021 5:34 PM  For on call review [email or web address].

## 2021-06-25 NOTE — Plan of Care (Signed)

## 2021-06-25 NOTE — Progress Notes (Signed)
Subjective:  Patient reports pain as moderate.  Sleeping soundly.  Objective:   VITALS:   Vitals:   06/24/21 1942 06/24/21 2351 06/25/21 0452 06/25/21 0816  BP: (!) 157/84 (!) 170/81 (!) 161/76 (!) 161/82  Pulse: 71 67 66 65  Resp: 18 18 18 18   Temp: 98.2 F (36.8 C) 98.6 F (37 C) 98.5 F (36.9 C) 97.8 F (36.6 C)  TempSrc: Oral   Oral  SpO2: 97% 99% 100% 100%  Weight:      Height:        PHYSICAL EXAM:  Neurovascular intact Compartment soft  LABS  Results for orders placed or performed during the hospital encounter of 06/23/21 (from the past 24 hour(s))  Glucose, capillary     Status: Abnormal   Collection Time: 06/24/21  1:22 PM  Result Value Ref Range   Glucose-Capillary 135 (H) 70 - 99 mg/dL  Glucose, capillary     Status: Abnormal   Collection Time: 06/24/21  3:59 PM  Result Value Ref Range   Glucose-Capillary 42 (LL) 70 - 99 mg/dL   Comment 1 Notify RN   Glucose, capillary     Status: Abnormal   Collection Time: 06/24/21  4:01 PM  Result Value Ref Range   Glucose-Capillary 40 (LL) 70 - 99 mg/dL   Comment 1 Notify RN   Glucose, capillary     Status: Abnormal   Collection Time: 06/24/21  4:05 PM  Result Value Ref Range   Glucose-Capillary 40 (LL) 70 - 99 mg/dL   Comment 1 Notify RN   Glucose, capillary     Status: Abnormal   Collection Time: 06/24/21  4:37 PM  Result Value Ref Range   Glucose-Capillary 123 (H) 70 - 99 mg/dL  Aerobic/Anaerobic Culture w Gram Stain (surgical/deep wound)     Status: None (Preliminary result)   Collection Time: 06/24/21  5:44 PM   Specimen: PATH Other; Wound  Result Value Ref Range   Specimen Description      ARM Performed at [facility], [address] [place], [place] [address]    Special Requests      NONE Performed at [facility] [place], [place] [address]    Gram Stain      NO SQUAMOUS EPITHELIAL CELLS SEEN FEW WBC SEEN NO ORGANISMS SEEN Performed at [facility], [place] [address], [place], [place] [address]    Culture PENDING    Report Status PENDING   Glucose, capillary     Status: Abnormal   Collection Time: 06/24/21  6:33 PM  Result Value Ref Range   Glucose-Capillary 51 (L) 70 - 99 mg/dL  Glucose, capillary     Status: Abnormal   Collection Time: 06/24/21  6:59 PM  Result Value Ref Range   Glucose-Capillary 132 (H) 70 - 99 mg/dL  Basic metabolic panel     Status: Abnormal   Collection Time: 06/25/21  3:52 AM  Result Value Ref Range   Sodium 139 135 - 145 mmol/L   Potassium 4.3 3.5 - 5.1 mmol/L   Chloride 112 (H) 98 - 111 mmol/L   CO2 21 (L) 22 - 32 mmol/L   Glucose, Bld 51 (L) 70 - 99 mg/dL   BUN 18 6 - 20 mg/dL   Creatinine, Ser 1.03 0.61 - 1.24 mg/dL   Calcium 8.8 (L) 8.9 - 10.3 mg/dL   GFR, Estimated >60 >60 mL/min   Anion gap 6 5 - 15  CBC  Status: Abnormal   Collection Time: 06/25/21  3:52 AM  Result Value Ref Range   WBC 8.4 4.0 - 10.5 K/uL   RBC 4.64 4.22 - 5.81 MIL/uL   Hemoglobin 11.4 (L) 13.0 - 17.0 g/dL   HCT 37.5 (L) 39.0 - 52.0 %   MCV 80.8 80.0 - 100.0 fL   MCH 24.6 (L) 26.0 - 34.0 pg   MCHC 30.4 30.0 - 36.0 g/dL   RDW 15.3 11.5 - 15.5 %   Platelets 189 150 - 400 K/uL   nRBC 0.0 0.0 - 0.2 %  Magnesium     Status: Abnormal   Collection Time: 06/25/21  3:52 AM  Result Value Ref Range   Magnesium 1.4 (L) 1.7 - 2.4 mg/dL  Glucose, capillary     Status: Abnormal   Collection Time: 06/25/21  7:57 AM  Result Value Ref Range   Glucose-Capillary 37 (LL) 70 - 99 mg/dL   Comment 1 Notify RN   Glucose, capillary     Status: Abnormal   Collection Time: 06/25/21  8:19 AM  Result Value Ref Range   Glucose-Capillary 45 (L) 70 - 99 mg/dL  Glucose, capillary     Status: Abnormal   Collection Time: 06/25/21  8:49 AM  Result Value Ref Range   Glucose-Capillary 66 (L) 70 - 99 mg/dL  Glucose, capillary     Status: Abnormal   Collection Time: 06/25/21 11:59 AM  Result Value Ref Range   Glucose-Capillary 173 (H)  70 - 99 mg/dL    DG Elbow Complete Left  Result Date: 06/24/2021 CLINICAL DATA:  Left elbow pain and swelling. EXAM: LEFT ELBOW - COMPLETE 3+ VIEW COMPARISON:  Recent study 2 days ago 06/21/2021 FINDINGS: Circumferential soft tissue edema is again noted greatest posteriorly. There are positive fat pad signs again seen consistent with a joint effusion. There is no evidence of fractures or erosive arthropathy with mild narrowing and trace spurring again at the trochleoulnar joint. There is no dislocation. Calcific plaques in the brachial artery, radial, ulnar and interosseous arteries are again shown. Subcutaneous soft tissue mass measuring 2.2 cm again noted in the antecubital fossa, with scattered coarse dystrophic calcifications. Another is seen in the ventrolateral upper arm measuring up to 5.5 cm and a third mass, also with dystrophic calcifications is partially visible just above this. All of these were seen previously. There have been no interval radiographic changes. IMPRESSION: 1. No fracture or destructive lesion is seen. 2. Calcific arteriosclerosis. 3. Again noted joint effusion. 4. Diffuse edema greater posteriorly with stable subcutaneous soft tissue masses with dystrophic calcifications. Electronically Signed   By: Telford Nab M.D.   On: 06/24/2021 00:09   US Venous Img Upper Uni Left  Result Date: 06/23/2021 CLINICAL DATA:  Left upper extremity swelling, known arteriovenous fistula EXAM: LEFT UPPER EXTREMITY VENOUS DOPPLER ULTRASOUND TECHNIQUE: Gray-scale sonography with graded compression, as well as color Doppler and duplex ultrasound were performed to evaluate the upper extremity deep venous system from the level of the subclavian vein and including the jugular, axillary, basilic, radial, ulnar and upper cephalic vein. Spectral Doppler was utilized to evaluate flow at rest and with distal augmentation maneuvers. COMPARISON:  None. FINDINGS: Contralateral Subclavian Vein: Respiratory  phasicity is normal and symmetric with the symptomatic side. No evidence of thrombus. Normal compressibility. Internal Jugular Vein: No evidence of thrombus. Normal compressibility, respiratory phasicity and response to augmentation. Subclavian Vein: No evidence of thrombus. Normal compressibility, respiratory phasicity and response to augmentation. Axillary Vein: No evidence  of thrombus. Normal compressibility, respiratory phasicity and response to augmentation. Cephalic Vein: No evidence of thrombus. Normal compressibility, respiratory phasicity and response to augmentation. Basilic Vein: No evidence of thrombus. Normal compressibility, respiratory phasicity and response to augmentation. Brachial Veins: No evidence of thrombus. Normal compressibility, respiratory phasicity and response to augmentation. Radial Veins: No evidence of thrombus. Normal compressibility, respiratory phasicity and response to augmentation. Ulnar Veins: No evidence of thrombus. Normal compressibility, respiratory phasicity and response to augmentation. Venous Reflux:  None visualized. 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 Mild dilatation is noted in its proximal segment. IMPRESSION: No evidence of DVT within the left upper extremity. Patent arteriovenous fistula. Electronically Signed   By: Inez Catalina M.D.   On: 06/23/2021 23:21    Assessment/Plan: 1 Day Post-Op   Principal Problem:   Septic arthritis of elbow, left ([place]) Active Problems:   GERD (gastroesophageal reflux disease)   Type 2 diabetes mellitus ([place])   Essential hypertension, benign   Mixed hyperlipidemia   History of renal transplant   Peripheral vascular disease ([place])   Discharge home with home health when cleared by primary team RTC in 2 weeks for suture removal Dressing change tomorrow   Lovell Sheehan , MD 06/25/2021, 12:56 PM

## 2021-06-25 NOTE — Progress Notes (Addendum)
Inpatient Diabetes Program Recommendations  AACE/ADA: New Consensus Statement on Inpatient Glycemic Control (2015)  Target Ranges:  Prepandial:   less than 140 mg/dL      Peak postprandial:   less than 180 mg/dL (1-2 hours)      Critically ill patients:  140 - 180 mg/dL    Latest Reference Range & Units 06/24/21 08:51 06/24/21 13:22 06/24/21 15:59 06/24/21 16:01 06/24/21 16:05 06/24/21 16:37 06/24/21 18:33 06/24/21 18:59  Glucose-Capillary 70 - 99 mg/dL 172 (H)  100 units NPH @0846  135 (H) 42 (LL) 40 (LL) 40 (LL) 123 (H) 51 (L) 132 (H)    Home DM Meds: NPH Insulin 100 units Daily   Trulicity 3.53 mg Qweek   Glipizide 5 mg Daily    Current Orders: NPH Insulin 100 units Daily  Glipizide 5 mg Daily  Novolog Sensitive Correction Scale/ SSI (0-9 units) TID AC     MD- Note Hypoglycemia yesterday afternoon.  Please consider:  1. Reduce the NPH Insulin to 50 units Daily (50% home dose)  2. Stop Glipizide for now--can resume at time of discharge    Endocrinologist: Dr. Loanne Drilling with [facility] Last seen 29/92/4268 Added Trulicity 3.41 mg Qweek at that visit At prior visit with Dr. Loanne Drilling (03/05/2021), pt was told to stop his Toujeo and Novolog insulins (was on Toujeo 80 units daily + Novolog 10 units TID with meals) and was placed on NPH Insulin 100 units Daily     --Will follow patient during hospitalization--  Wyn Quaker RN, MSN, CDE Diabetes Coordinator Inpatient Glycemic Control Team Team Pager: [phone] (8a-5p)

## 2021-06-26 ENCOUNTER — Encounter: Payer: Self-pay | Admitting: Family Medicine

## 2021-06-26 LAB — BODY FLUID CULTURE W GRAM STAIN

## 2021-06-26 LAB — CBC
HCT: 36.6 % — ABNORMAL LOW (ref 39.0–52.0)
Hemoglobin: 11.5 g/dL — ABNORMAL LOW (ref 13.0–17.0)
MCH: 26 pg (ref 26.0–34.0)
MCHC: 31.4 g/dL (ref 30.0–36.0)
MCV: 82.6 fL (ref 80.0–100.0)
Platelets: 199 10*3/uL (ref 150–400)
RBC: 4.43 MIL/uL (ref 4.22–5.81)
RDW: 14.9 % (ref 11.5–15.5)
WBC: 7.4 10*3/uL (ref 4.0–10.5)
nRBC: 0 % (ref 0.0–0.2)

## 2021-06-26 LAB — BASIC METABOLIC PANEL
Anion gap: 6 (ref 5–15)
BUN: 20 mg/dL (ref 6–20)
CO2: 22 mmol/L (ref 22–32)
Calcium: 9.1 mg/dL (ref 8.9–10.3)
Chloride: 109 mmol/L (ref 98–111)
Creatinine, Ser: 1.05 mg/dL (ref 0.61–1.24)
GFR, Estimated: >60 mL/min (ref >60)
Glucose, Bld: 128 mg/dL — ABNORMAL HIGH (ref 70–99)
Potassium: 4.3 mmol/L (ref 3.5–5.1)
Sodium: 137 mmol/L (ref 135–145)

## 2021-06-26 LAB — GLUCOSE, CAPILLARY
Glucose-Capillary: 105 mg/dL — ABNORMAL HIGH (ref 70–99)
Glucose-Capillary: 105 mg/dL — ABNORMAL HIGH (ref 70–99)
Glucose-Capillary: 98 mg/dL (ref 70–99)
Glucose-Capillary: 98 mg/dL (ref 70–99)

## 2021-06-26 LAB — MAGNESIUM: Magnesium: 1.9 mg/dL (ref 1.7–2.4)

## 2021-06-26 MED ORDER — CLONIDINE HCL 0.3 MG/24HR TD PTWK
0.3000 mg | MEDICATED_PATCH | TRANSDERMAL | Status: DC
  Administered 2021-06-26: 0.3 mg via TRANSDERMAL
  Filled 2021-06-26: qty 1

## 2021-06-26 NOTE — Op Note (Signed)
06/24/2021  5:18 PM  PATIENT:  Raymond [name]    PRE-OPERATIVE DIAGNOSIS:  Septic left elbow  POST-OPERATIVE DIAGNOSIS:  Same  PROCEDURE:  IRRIGATION AND DEBRIDEMENT ELBOW, LEFT  SURGEON:  Lovell Sheehan, MD  ASSIST: none  ANESTHESIA:   General  EBL: less than 25 mL   TOURNIQUET TIME: none used  PREOPERATIVE INDICATIONS:  Raymond [name] is a  61 y.o. male who failed conservative measures and elected for surgical management of the deep infection.   The risks benefits and alternatives were discussed with the patient preoperatively including but not limited to the risks of infection, bleeding, nerve injury, incomplete relief of symptoms, pillar pain, cardiopulmonary complications, the need for revision surgery, among others, and the patient was willing to proceed.  OPERATIVE FINDINGS: Warmth, erythema, swelling, joint effusion  OPERATIVE PROCEDURE:   After informed consent was obtained and the appropriate extremity marked in the pre-operative holding area, the patient was taken to the operating room and placed in the supine position. General anesthesia was induced and the upper extremity was prepped and draped in standard sterile fashion. A standard Kocher approach to the elbow was performed through a 6 cm posterolateral incision. The interval between the anconeus and ECU was identified. Care was taken to protect the tendinous and nervous structures. The joint was exposed. Cultures were obtained. Synovial tissue and debris was debrided sharply with a rongeur. The wound was irrigated with more than 3 liters of bacitracin laced normal saline.  The capsule was closed with 0 monocryl. A vessel loop drain was left beneath the retinaculum and brought out the distal portion of the incision. The subcutaneous tissue was closed with 2-0 monocryl. The skin was closed loosely with 4-0 nylon. A sterile dressing was applied followed.   [name], MD

## 2021-06-26 NOTE — Consult Note (Signed)
PHARMACY CONSULT NOTE FOR:  OUTPATIENT  PARENTERAL ANTIBIOTIC THERAPY (OPAT)  Indication: Left elbow septic arthritis  Regimen: Ceftriaxone 2 grms IV every 24 hours  End date: 07/19/21  IV antibiotic discharge orders are pended. To discharging provider:  please sign these orders via discharge navigator,  Select New Orders & click on the button choice - Manage This Unsigned Work.     Thank you for allowing pharmacy to be a part of this patient's care.  Darnelle Bos, PharmD 06/26/2021, 7:58 AM

## 2021-06-26 NOTE — Consult Note (Signed)
Chief Complaint: Patient was seen in consultation today for left elbow septic arthritis at the request of Terrilee Croak, MD  Referring Physician(s): Terrilee Croak, MD  Supervising Physician: Juliet Rude  Patient Status: [place] - In-pt  History of Present Illness: Raymond [name] is a 61 y.o. male with pertinent PMHx of CKD s/p renal transplant and admitted from ED on 1/29 with complaints of left elbow swelling found to have septic arthritis and need for long term outpatient intra-venous antibiotic therapy, patient has been seen by infectious disease and orthopedics. Request received for IR image guided IJ tunneled central catheter placement. The patient denies any current chest pain or shortness of breath. He has previously had IJ lines in the past on his right side and left side, he does have a left arm AV fistula. He has no known complications to sedation.   Past Medical History:  Diagnosis Date   Chronic kidney disease    Awaiting transplant   Diabetes ([place])    Diabetes mellitus without complication ([place])    Hypertension    Renal disorder    Renal insufficiency     Past Surgical History:  Procedure Laterality Date   BONE BIOPSY Right 03/06/2021   Procedure: BONE BIOPSY;  Surgeon: Trula Slade, DPM;  Location: [facility];  Service: Podiatry;  Laterality: Right;   CENTRAL VENOUS CATHETER INSERTION Right 01/20/2020   Procedure: INSERTION CENTRAL LINE ADULT; Tunneled central line;  Surgeon: Virl Cagey, MD;  Location: [facility];  Service: General;  Laterality: Right;   COLONOSCOPY N/A 12/05/2014   EHM:CNOBSJGG external and internal hemorrhoid/mild diverticulosis/11 polyps removed   ESOPHAGOGASTRODUODENOSCOPY N/A 12/05/2014   EZM:OQHU duodenitis   GRAFT APPLICATION Right 76/54/6503   Procedure: GRAFT APPLICATION;  Surgeon: Trula Slade, DPM;  Location: [facility];  Service: Podiatry;  Laterality: Right;   IR FLUORO GUIDE CV LINE RIGHT  03/01/2019   IR REMOVAL TUN  CV CATH W/O FL  05/10/2019   IR REMOVAL TUN CV CATH W/O FL  02/29/2020   IR US GUIDE VASC ACCESS RIGHT  03/01/2019   IRRIGATION AND DEBRIDEMENT ELBOW Left 06/24/2021   Procedure: IRRIGATION AND DEBRIDEMENT ELBOW;  Surgeon: Lovell Sheehan, MD;  Location: [facility] OR[facility];  Service: Orthopedics;  Laterality: Left;   KIDNEY TRANSPLANT  03/27/2015   Penile Pump Insertion     TEE WITHOUT CARDIOVERSION N/A 01/17/2020   Procedure: TRANSESOPHAGEAL ECHOCARDIOGRAM (TEE) WITH PROPOFOL;  Surgeon: Arnoldo Lenis, MD;  Location[facility];  Service: Endoscopy;  Laterality: N/A;   WOUND DEBRIDEMENT Right 03/06/2021   Procedure: DEBRIDEMENT WOUND;  Surgeon: Trula Slade, DPM;  Location: [facility];  Service: Podiatry;  Laterality: Right;    Allergies: Patient has no known allergies.  Medications: Prior to Admission medications   Medication Sig Start Date End Date Taking? Authorizing Provider  blood glucose meter kit and supplies KIT Dispense based on patient and insurance preference. Use up to four times daily as directed. (FOR ICD-9 250.00, 250.01). 04/11/20  Yes [name], [name], MD  Blood Glucose Monitoring Suppl (ACCU-CHEK GUIDE ME) w/Device KIT 1 Piece by Does not apply route as directed. 02/18/18  Yes [name], [name], MD  cloNIDine (CATAPRES - DOSED IN MG/24 HR) 0.3 mg/24hr patch Place 0.3 mg onto the skin once a week.   Yes [provider]  colchicine 0.6 MG tablet Take 0.6 mg by mouth daily. 06/17/21  Yes [provider]  doxazosin (CARDURA) 4 MG tablet Take 6 mg by mouth  daily. 12/18/15  Yes [provider]  Dulaglutide (TRULICITY) 0.62 BJ/6.2GB SOPN Inject 0.75 mg into the skin once a week. 03/25/21  Yes Renato Shin, MD  glipiZIDE (GLUCOTROL XL) 5 MG 24 hr tablet Take 5 mg by mouth daily.    Yes [provider]  glucose blood (ACCU-CHEK GUIDE) test strip Use as instructed 4 x daily. E11.65 02/19/18  Yes [name], [name], MD  hydrALAZINE  (APRESOLINE) 25 MG tablet Take 25 mg by mouth 2 (two) times daily.   Yes [provider]  HYDROcodone-acetaminophen (NORCO/VICODIN) 5-325 MG tablet Take 1 tablet by mouth every 6 (six) hours as needed. 03/06/21  Yes Trula Slade, DPM  Insulin NPH, Human,, Isophane, (NOVOLIN N FLEXPEN) 100 UNIT/ML Kiwkpen Inject 100 Units into the skin every morning. 03/05/21  Yes Renato Shin, MD  Multiple Vitamin (MULTIVITAMIN) capsule Take 1 capsule by mouth daily.   Yes [provider]  sodium bicarbonate 650 MG tablet Take 1,300 mg by mouth 3 (three) times daily. 11/02/18  Yes [provider]  tacrolimus (PROGRAF) 1 MG capsule Take 3 mg by mouth 2 (two) times daily. Take 3 capsules (56m) in AM & take 3 caps (318m in PM   Yes [provider]  Vitamin D, Ergocalciferol, (DRISDOL) 50000 units CAPS capsule Take 50,000 Units by mouth every 30 (thirty) days.   Yes [provider]  atorvastatin (LIPITOR) 10 MG tablet Take 1 tablet (10 mg total) by mouth daily. 03/14/21 06/12/21  MoMinette BrineFNP  Continuous Blood Gluc Receiver (FREESTYLE LIBRE 2 READER) DEVI As directed Patient not taking: Reported on 06/18/2021 04/24/20   NiCassandria AngerMD  Continuous Blood Gluc Sensor (FREESTYLE LIBRE 2 SENSOR) MISC 1 Piece by Does not apply route every 14 (fourteen) days. Patient not taking: Reported on 06/18/2021 04/24/20   NiCassandria AngerMD  silver sulfADIAZINE (SILVADENE) 1 % cream Apply 1 application topically daily. Patient not taking: Reported on 05/15/2021 01/31/21   WaTrula SladeDPM  triamcinolone (KENALOG) 0.025 % ointment Apply 1 application topically 3 (three) times daily. Patient not taking: Reported on 05/15/2021 01/07/20   [provider]     Family History  Problem Relation Age of Onset   Diabetes Mother    Heart disease Father    Colon cancer Neg Hx     Social History   Socioeconomic History   Marital status: Married    Spouse  name: Not on file   Number of children: Not on file   Years of education: Not on file   Highest education level: Not on file  Occupational History   Not on file  Tobacco Use   Smoking status: Never   Smokeless tobacco: Never  Vaping Use   Vaping Use: Never used  Substance and Sexual Activity   Alcohol use: No    Alcohol/week: 0.0 standard drinks   Drug use: No   Sexual activity: Not on file  Other Topics Concern   Not on file  Social History Narrative   ** Merged History Encounter **       Social Determinants of Health   Financial Resource Strain: Not on file  Food Insecurity: Not on file  Transportation Needs: Not on file  Physical Activity: Not on file  Stress: Not on file  Social Connections: Not on file   Review of Systems: A 12 point ROS discussed and pertinent positives are indicated in the HPI above.  All other systems are negative.  Review of  Systems  Vital Signs: BP (!) 157/85 (BP Location: Right Arm)    Pulse 70    Temp 98.1 F (36.7 C) (Oral)    Resp 15    Ht '5\' 7"'  (1.702 m)    Wt 230 lb (104.3 kg)    SpO2 99%    BMI 36.02 kg/m   Physical Exam Constitutional:      Appearance: Normal appearance.  HENT:     Head: Normocephalic and atraumatic.  Cardiovascular:     Rate and Rhythm: Normal rate and regular rhythm.  Pulmonary:     Effort: Pulmonary effort is normal. No respiratory distress.  Musculoskeletal:     Comments: Left arm fistula  Skin:    General: Skin is warm and dry.  Neurological:     Mental Status: He is alert and oriented to person, place, and time.   Imaging: DG Elbow Complete Left  Result Date: 06/24/2021 CLINICAL DATA:  Left elbow pain and swelling. EXAM: LEFT ELBOW - COMPLETE 3+ VIEW COMPARISON:  Recent study 2 days ago 06/21/2021 FINDINGS: Circumferential soft tissue edema is again noted greatest posteriorly. There are positive fat pad signs again seen consistent with a joint effusion. There is no evidence of fractures or erosive  arthropathy with mild narrowing and trace spurring again at the trochleoulnar joint. There is no dislocation. Calcific plaques in the brachial artery, radial, ulnar and interosseous arteries are again shown. Subcutaneous soft tissue mass measuring 2.2 cm again noted in the antecubital fossa, with scattered coarse dystrophic calcifications. Another is seen in the ventrolateral upper arm measuring up to 5.5 cm and a third mass, also with dystrophic calcifications is partially visible just above this. All of these were seen previously. There have been no interval radiographic changes. IMPRESSION: 1. No fracture or destructive lesion is seen. 2. Calcific arteriosclerosis. 3. Again noted joint effusion. 4. Diffuse edema greater posteriorly with stable subcutaneous soft tissue masses with dystrophic calcifications. Electronically Signed   By: Telford Nab M.D.   On: 06/24/2021 00:09   DG Elbow Complete Left  Result Date: 06/22/2021 CLINICAL DATA:  Left elbow pain with swelling. EXAM: LEFT ELBOW - COMPLETE 3+ VIEW COMPARISON:  Left elbow x-ray 06/17/2021. FINDINGS: There are rounded soft tissue densities containing coarse calcifications in the anterior aspect of the arm at the level of the mid humerus measuring 5.8 cm in 4.3 cm in length, unchanged from prior. There is also smaller soft tissue density with coarse calcifications in the antecubital fossa measuring 1.9 cm, stable. Peripheral vascular calcifications are present. There is marked soft tissue swelling of the posterior elbow. Elbow joint effusion is present. There is no evidence for acute fracture or dislocation. Joint spaces are well maintained. No periosteal reaction or cortical erosion. IMPRESSION: 1. Posterior soft tissue swelling. 2. Joint effusion. 3. No acute bony abnormality. 4. Stable chronic soft tissue densities and calcifications. Electronically Signed   By: Ronney Asters M.D.   On: 06/22/2021 21:55   US Venous Img Upper Uni Left  Result  Date: 06/23/2021 CLINICAL DATA:  Left upper extremity swelling, known arteriovenous fistula EXAM: LEFT UPPER EXTREMITY VENOUS DOPPLER ULTRASOUND TECHNIQUE: Gray-scale sonography with graded compression, as well as color Doppler and duplex ultrasound were performed to evaluate the upper extremity deep venous system from the level of the subclavian vein and including the jugular, axillary, basilic, radial, ulnar and upper cephalic vein. Spectral Doppler was utilized to evaluate flow at rest and with distal augmentation maneuvers. COMPARISON:  None. FINDINGS: Contralateral Subclavian Vein:  Respiratory phasicity is normal and symmetric with the symptomatic side. No evidence of thrombus. Normal compressibility. Internal Jugular Vein: No evidence of thrombus. Normal compressibility, respiratory phasicity and response to augmentation. Subclavian Vein: No evidence of thrombus. Normal compressibility, respiratory phasicity and response to augmentation. Axillary Vein: No evidence of thrombus. Normal compressibility, respiratory phasicity and response to augmentation. Cephalic Vein: No evidence of thrombus. Normal compressibility, respiratory phasicity and response to augmentation. Basilic Vein: No evidence of thrombus. Normal compressibility, respiratory phasicity and response to augmentation. Brachial Veins: No evidence of thrombus. Normal compressibility, respiratory phasicity and response to augmentation. Radial Veins: No evidence of thrombus. Normal compressibility, respiratory phasicity and response to augmentation. Ulnar Veins: No evidence of thrombus. Normal compressibility, respiratory phasicity and response to augmentation. Venous Reflux:  None visualized. 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 Mild dilatation is noted in its proximal segment. IMPRESSION: No evidence of DVT within the left upper extremity. Patent arteriovenous fistula. Electronically Signed   By: Inez Catalina M.D.   On:  06/23/2021 23:21    Labs:  CBC: Recent Labs    06/23/21 2115 06/24/21 0653 06/25/21 0352 06/26/21 0306  WBC 7.2 7.8 8.4 7.4  HGB 12.0* 13.7 11.4* 11.5*  HCT 38.7* 45.4 37.5* 36.6*  PLT 198 228 189 199    COAGS: No results for input(s): INR, APTT in the last 8760 hours.  BMP: Recent Labs    06/23/21 2115 06/24/21 0653 06/25/21 0352 06/26/21 0306  NA 140 139 139 137  K 4.4 4.5 4.3 4.3  CL 111 109 112* 109  CO2 23 21* 21* 22  GLUCOSE 312* 158* 51* 128*  BUN 30* 26* 18 20  CALCIUM 9.4 9.6 8.8* 9.1  CREATININE 1.41* 1.12 1.03 1.05  GFRNONAA 57* >60 >60 >60    LIVER FUNCTION TESTS: Recent Labs    12/11/20 1005 02/28/21 1006 06/23/21 2115  BILITOT 0.3 0.4 0.3  AST '14 10 26  ' ALT 22 13 37  ALKPHOS 162* 149* 119  PROT 7.8 7.6 7.7  ALBUMIN 3.9 4.0 2.9*    Assessment and Plan: 61 year old male with pertinent PMHx of CKD s/p renal transplant and admitted from ED on 1/29 with complaints of left elbow swelling found to have septic arthritis and need for long term outpatient intra-venous antibiotic therapy, patient has been seen by infectious disease and orthopedics. Request received for IR image guided IJ tunneled central catheter placement.   The patient will be NPO after midnight, previous IJ catheters in past on both right and left side, has left arm fistula, labs and vitals have been reviewed.  Risks and benefits of image guided tunneled central line placement was discussed with the patient including, but not limited to bleeding, infection, or damage to adjacent structures.  All of the patient's questions were answered, patient is agreeable to proceed. Consent signed and in chart.  Thank you for this interesting consult.  I greatly enjoyed meeting Raymond [name] and look forward to participating in their care.  A copy of this report was sent to the requesting provider on this date.  Electronically Signed: Hedy Jacob, PA-C 06/26/2021, 2:58 PM   I spent a  total of 20 Minutes in face to face in clinical consultation, greater than 50% of which was counseling/coordinating care for need for longterm intravenous antibiotic therapy.

## 2021-06-26 NOTE — Plan of Care (Signed)

## 2021-06-26 NOTE — TOC Progression Note (Addendum)
Transition of Care [facility]) - Progression Note    Patient Details  Name: Raymond [name] MRN: [id number] Date of Birth: 1961-01-07  Transition of Care [facility]) CM/SW [place], RN Phone Number: 06/26/2021, 1:24 PM  Clinical Narrative:   [facility] will not be able to accept the patient, I called [facility] and spoke with [facility], I requested them to accept the patient for RN and PT, she will have their director call me back  I faxed the referral to [facility] and asked for nursing and PT,  The patient will need IV ABX, May need to get [facility] for nursing or [facility]       Expected Discharge Plan and Services                                                 Social Determinants of Health (SDOH) Interventions    Readmission Risk Interventions No flowsheet data found.

## 2021-06-26 NOTE — Progress Notes (Signed)
Date of Admission:  06/23/2021    ID: Raymond [name] is a 61 y.o. male Principal Problem:   Septic arthritis of elbow, left (HCC) Active Problems:   GERD (gastroesophageal reflux disease)   Type 2 diabetes mellitus (HCC)   Essential hypertension, benign   Mixed hyperlipidemia   History of renal transplant   Peripheral vascular disease (HCC)   Hypomagnesemia    Subjective: Says he is feeling better Left arm and elbow pain and swelling better Wants to go home   Medications:   atorvastatin  10 mg Oral Daily   Chlorhexidine Gluconate Cloth  6 each Topical Q0600   cloNIDine  0.3 mg Transdermal Weekly   colchicine  0.6 mg Oral Daily   docusate sodium  100 mg Oral BID   doxazosin  6 mg Oral Daily   enoxaparin (LOVENOX) injection  0.5 mg/kg Subcutaneous Q24H   hydrALAZINE  50 mg Oral TID   insulin aspart  0-9 Units Subcutaneous TID WC   insulin NPH Human  50 Units Subcutaneous QAC breakfast   multivitamin with minerals  1 tablet Oral Daily   mupirocin ointment  1 application Nasal BID   sodium bicarbonate  1,300 mg Oral TID   tacrolimus  3 mg Oral BID   Vitamin D (Ergocalciferol)  50,000 Units Oral Q30 days    Objective: Vital signs in last 24 hours: Temp:  [97.7 F (36.5 C)-98.7 F (37.1 C)] 97.7 F (36.5 C) (02/01 1616) Pulse Rate:  [69-73] 73 (02/01 1616) Resp:  [15-20] 18 (02/01 1616) BP: (157-199)/(71-87) 159/73 (02/01 1616) SpO2:  [97 %-100 %] 99 % (02/01 1616)  PHYSICAL EXAM:  General: Alert, cooperative, no distress, appears stated age.  Head: Normocephalic, without obvious abnormality, atraumatic. Eyes: Conjunctivae clear, anicteric sclerae. Pupils are equal ENT Nares normal. No drainage or sinus tenderness. Lips, mucosa, and tongue normal. No Thrush Neck: Supple, symmetrical, no adenopathy, thyroid: non tender no carotid bruit and no JVD. Back: No CVA tenderness. Lungs: Clear to auscultation bilaterally. No Wheezing or Rhonchi. No rales. Heart: Regular  rate and rhythm, no murmur, rub or gallop. Abdomen: Soft, non-tender,not distended. Bowel sounds normal. No masses Extremities: Left arm AV fistula left arm surgical dressing not removed Right foot on the plantar aspect at the mid MTP there is callus with a small opening in the center.   Skin: No rashes or lesions. Or bruising Lymph: Cervical, supraclavicular normal. Neurologic: Grossly non-focal  Lab Results Recent Labs    06/25/21 0352 06/26/21 0306  WBC 8.4 7.4  HGB 11.4* 11.5*  HCT 37.5* 36.6*  NA 139 137  K 4.3 4.3  CL 112* 109  CO2 21* 22  BUN 18 20  CREATININE 1.03 1.05   Liver Panel Recent Labs    06/23/21 2115  PROT 7.7  ALBUMIN 2.9*  AST 26  ALT 37  ALKPHOS 119  BILITOT 0.3  Microbiology: 06/24/2021.  Synovial fluid from left elbow shows group B streptococcus 06/24/2021 blood culture no growth  Assessment/Plan: Septic arthritis left elbow due to group B streptococcus.  Patient is currently on ceftriaxone he will need it for 4 weeks.  OPAT order has been placed.  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 Now has has a callus with a small opening in the center.  Does not look  infected.  Discussed the management with the patient and his wife I will follow him as outpatient  OPAT Orders Discharge antibiotics: Ceftriaxone 2 grms IV every 24 hours until 07/19/21 Duration: 4 weeks End Date: 07/19/21   PIC/central line Care Per Protocol:   Labs weekly while on IV antibiotics: _X_ CBC with differential   _X_ CMP   _X_ ESR     _X_ Please pull central line  at completion of IV antibiotics- at Intervention radiology with radiology on 07/19/21 to remove the line     Fax weekly labs to [facility] [phone]   Clinic Follow Up  Appt: 07/18/21 at 8.30 AM   ID will sign off call if needed.

## 2021-06-26 NOTE — Progress Notes (Signed)
PROGRESS NOTE  Raymond [name]  DOB: 1961/01/23  PCP: [name], [place]  DOA: 06/23/2021  LOS: 2 days  Hospital Day: 4  Chief Complaint  Patient presents with   left arm swelling   Brief narrative: Raymond [name] is a 61 y.o. male with PMH significant for DM2, HTN, CKD 3a s/p status post renal transplant  Patient presented to the ED on 06/23/2021 with complaint of acute worsening of left elbow swelling and pain for about a week.  Prior to admission, patient had completed a course of colchicine and was also given a dose of IM Rocephin without benefit.  In the ED, left elbow x-ray showed posterior soft tissue swelling, joint effusion with no acute bony abnormality and stable chronic soft tissue densities and calcifications. Patient was started on broad-spectrum antibiotics, IV Dilaudid. Patient underwent arthrocentesis by ER physician and culture was sent. Admitted to hospital service.  Orthopedics was consulted.  Subjective: Patient was seen and examined this morning.  Pleasant middle-aged African-American male.  Lying on bed.  Not in distress.  No new symptoms.  Currently waiting for central line placement by IR.  Assessment/Plan: Acute septic arthritis of left elbow -Presented with worsening pain, swelling of left elbow for more than a week, not responding to outpatient course of colchicine and antibiotic. -Joint aspirated in the ED.  Fluid culture grew rare strep agalactiae -Blood culture negative. -ID consultation was obtained.  Recommended IV Rocephin 2 g IV daily for 4 weeks to complete on 07/19/2021 -IR consulted for right IJ central line placement.  Nephrology recommended against putting PICC line because of potential need of dialysis in the future.  CKD 3a Renal transplant status -Creatinine stable.  Continue tacrolimus, sodium bicarbonate Recent Labs    12/11/20 1005 12/13/20 1608 01/31/21 1040 02/28/21 1006 03/26/21 1513 06/23/21 2115 06/24/21 0653  06/25/21 0352 06/26/21 0306  BUN 32* 30* 31* 30* 20 30* 26* 18 20  CREATININE 1.34* 1.45* 1.25 1.30* 1.30 1.41* 1.12 1.03 1.05   Uncontrolled type 2 diabetes mellitus -A1c 10.6 on 06/24/2021, it seems better than A1c of 15.5 in October 2022 -Home regimen is NPH Insulin 619 units Daily,Trulicity weekly, glipizide 5 mg Daily -Currently on reduced dose of NPH at 50 units daily with sliding scale insulin.  Trulicity and glipizide on hold.  Apparently patient had episodes of hypoglycemia at home.   Recent Labs  Lab 06/25/21 1159 06/25/21 1633 06/25/21 2017 06/26/21 0801 06/26/21 1158  GLUCAP 173* 161* 150* 105* 105*   Essential hypertension -Home meds include clonidine 0.3 mg/24 patch weekly, doxazosin 6 mg daily, hydralazine 25 mg twice daily -Currently continued on all.  Hyperlipidemia -Continue Lipitor  Hypomagnesemia -Magnesium level is 1.4 yesterday.  Replaced. Recent Labs  Lab 06/23/21 2115 06/24/21 0653 06/25/21 0352 06/26/21 0306  K 4.4 4.5 4.3 4.3  MG  --   --  1.4* 1.9   Mobility: Encourage ambulation Living condition: Lives at home with wife Goals of care:   Code Status: Full Code  Nutritional status: Body mass index is 36.02 kg/m.      Diet:  Diet Order             Diet NPO time specified  Diet effective midnight           Diet Heart Room service appropriate? Yes; Fluid consistency: Thin  Diet effective now                  DVT prophylaxis:  SCDs Start: 06/24/21 2156  Antimicrobials: IV Rocephin 2 g daily Fluid: Stop IV fluid today Consultants: ID, orthopedics, nephrology, IR Family Communication: Wife at bedside  Status is: Inpatient  Continue in-hospital care because: Pending central line placement by IR Level of care: Med-Surg   Dispo: The patient is from: Home              Anticipated d/c is to: Home              Patient currently is not medically stable to d/c.   Difficult to place patient No     Infusions:   cefTRIAXone  (ROCEPHIN)  IV 2 g (06/26/21 0522)    Scheduled Meds:  atorvastatin  10 mg Oral Daily   Chlorhexidine Gluconate Cloth  6 each Topical Q0600   cloNIDine  0.3 mg Transdermal Weekly   colchicine  0.6 mg Oral Daily   docusate sodium  100 mg Oral BID   doxazosin  6 mg Oral Daily   enoxaparin (LOVENOX) injection  0.5 mg/kg Subcutaneous Q24H   hydrALAZINE  50 mg Oral TID   insulin aspart  0-9 Units Subcutaneous TID WC   insulin NPH Human  50 Units Subcutaneous QAC breakfast   multivitamin with minerals  1 tablet Oral Daily   mupirocin ointment  1 application Nasal BID   sodium bicarbonate  1,300 mg Oral TID   tacrolimus  3 mg Oral BID   Vitamin D (Ergocalciferol)  50,000 Units Oral Q30 days    PRN meds: acetaminophen, HYDROcodone-acetaminophen, HYDROcodone-acetaminophen, magnesium hydroxide, metoCLOPramide **OR** metoCLOPramide (REGLAN) injection, morphine injection, ondansetron **OR** ondansetron (ZOFRAN) IV, traZODone   Antimicrobials: Anti-infectives (From admission, onward)    Start     Dose/Rate Route Frequency Ordered Stop   06/25/21 0600  cefTRIAXone (ROCEPHIN) 2 g in sodium chloride 0.9 % 100 mL IVPB        2 g 200 mL/hr over 30 Minutes Intravenous Every 24 hours 06/24/21 0348 07/01/21 0559   06/24/21 0300  vancomycin (VANCOREADY) IVPB 2000 mg/400 mL        2,000 mg 200 mL/hr over 120 Minutes Intravenous  Once 06/24/21 0259 06/24/21 0527   06/24/21 0130  cefTRIAXone (ROCEPHIN) 2 g in sodium chloride 0.9 % 100 mL IVPB        2 g 200 mL/hr over 30 Minutes Intravenous  Once 06/24/21 0117 06/24/21 0237       Objective: Vitals:   06/26/21 0800 06/26/21 1156  BP: (!) 199/87 (!) 157/85  Pulse: 70 70  Resp: 15 15  Temp: 98.5 F (36.9 C) 98.1 F (36.7 C)  SpO2: 100% 99%    Intake/Output Summary (Last 24 hours) at 06/26/2021 1303 Last data filed at 06/26/2021 1031 Gross per 24 hour  Intake 2513.56 ml  Output 810 ml  Net 1703.56 ml   Filed Weights   06/23/21 2100  06/24/21 1552  Weight: 104.3 kg 104.3 kg   Weight change:  Body mass index is 36.02 kg/m.   Physical Exam: General exam: Pleasant, middle-aged African-American male.  Not in distress Skin: No rashes, lesions or ulcers. HEENT: Atraumatic, normocephalic, no obvious bleeding Lungs: Clear to auscultation bilaterally CVS: Regular rate and rhythm, no murmur GI/Abd soft, nontender, nondistended, bowel sound present CNS: Alert, awake, oriented x3 Psychiatry: Mood appropriate Extremities: Left elbow has compression bandages on  Data Review: I have personally reviewed the laboratory data and studies available.  F/u labs  [employer] (From admission, onward)    None       Signed, Eileen Croswell  Heaven Meeker, MD [facility] 06/26/2021

## 2021-06-26 NOTE — Progress Notes (Signed)
Subjective:  Patient reports pain as mild.    Objective:   VITALS:   Vitals:   06/25/21 0816 06/25/21 1641 06/25/21 2015 06/26/21 0501  BP: (!) 161/82 (!) 182/83 (!) 195/84 (!) 160/71  Pulse: 65 68 71 69  Resp: 18 18 16 20   Temp: 97.8 F (36.6 C) 98.5 F (36.9 C) 98.7 F (37.1 C) 98.5 F (36.9 C)  TempSrc: Oral     SpO2: 100% 100% 100% 97%  Weight:      Height:        PHYSICAL EXAM:  Neurologically intact ABD soft Neurovascular intact Sensation intact distally Intact pulses distally Dorsiflexion/Plantar flexion intact Incision: dressing C/D/I No cellulitis present Compartment soft  LABS  Results for orders placed or performed during the hospital encounter of 06/23/21 (from the past 24 hour(s))  Glucose, capillary     Status: Abnormal   Collection Time: 06/25/21  7:57 AM  Result Value Ref Range   Glucose-Capillary 37 (LL) 70 - 99 mg/dL   Comment 1 Notify RN   Glucose, capillary     Status: Abnormal   Collection Time: 06/25/21  8:19 AM  Result Value Ref Range   Glucose-Capillary 45 (L) 70 - 99 mg/dL  Glucose, capillary     Status: Abnormal   Collection Time: 06/25/21  8:49 AM  Result Value Ref Range   Glucose-Capillary 66 (L) 70 - 99 mg/dL  Glucose, capillary     Status: Abnormal   Collection Time: 06/25/21 11:59 AM  Result Value Ref Range   Glucose-Capillary 173 (H) 70 - 99 mg/dL  Glucose, capillary     Status: Abnormal   Collection Time: 06/25/21  4:33 PM  Result Value Ref Range   Glucose-Capillary 161 (H) 70 - 99 mg/dL  Glucose, capillary     Status: Abnormal   Collection Time: 06/25/21  8:17 PM  Result Value Ref Range   Glucose-Capillary 150 (H) 70 - 99 mg/dL   Comment 1 Notify RN    Comment 2 Document in Chart   Magnesium     Status: None   Collection Time: 06/26/21  3:06 AM  Result Value Ref Range   Magnesium 1.9 1.7 - 2.4 mg/dL  Basic metabolic panel     Status: Abnormal   Collection Time: 06/26/21  3:06 AM  Result Value Ref Range    Sodium 137 135 - 145 mmol/L   Potassium 4.3 3.5 - 5.1 mmol/L   Chloride 109 98 - 111 mmol/L   CO2 22 22 - 32 mmol/L   Glucose, Bld 128 (H) 70 - 99 mg/dL   BUN 20 6 - 20 mg/dL   Creatinine, Ser 1.05 0.61 - 1.24 mg/dL   Calcium 9.1 8.9 - 10.3 mg/dL   GFR, Estimated >60 >60 mL/min   Anion gap 6 5 - 15  CBC     Status: Abnormal   Collection Time: 06/26/21  3:06 AM  Result Value Ref Range   WBC 7.4 4.0 - 10.5 K/uL   RBC 4.43 4.22 - 5.81 MIL/uL   Hemoglobin 11.5 (L) 13.0 - 17.0 g/dL   HCT 36.6 (L) 39.0 - 52.0 %   MCV 82.6 80.0 - 100.0 fL   MCH 26.0 26.0 - 34.0 pg   MCHC 31.4 30.0 - 36.0 g/dL   RDW 14.9 11.5 - 15.5 %   Platelets 199 150 - 400 K/uL   nRBC 0.0 0.0 - 0.2 %    No results found.  Assessment/Plan: 2 Days Post-Op   Principal Problem:  Septic arthritis of elbow, left (HCC) Active Problems:   GERD (gastroesophageal reflux disease)   Type 2 diabetes mellitus (HCC)   Essential hypertension, benign   Mixed hyperlipidemia   History of renal transplant   Peripheral vascular disease ([place])   Hypomagnesemia   Discharge home with home health when cleared by primary team RTC in 2 weeks for suture removal Dressing changed Follow up in our office on 07/05/21.  Call to confirm appointment [phone]   Carlynn Spry , PA-C 06/26/2021, 7:16 AM

## 2021-06-27 ENCOUNTER — Inpatient Hospital Stay: Payer: BC Managed Care – PPO | Admitting: Radiology

## 2021-06-27 HISTORY — PX: IR PERC TUN PERIT CATH WO PORT S&I /IMAG: IMG2327

## 2021-06-27 HISTORY — PX: IR US GUIDE VASC ACCESS RIGHT: IMG2390

## 2021-06-27 LAB — SEDIMENTATION RATE: Sed Rate: 56 mm/hr — ABNORMAL HIGH (ref 0–20)

## 2021-06-27 LAB — C-REACTIVE PROTEIN: CRP: 11.6 mg/dL — ABNORMAL HIGH (ref <1.0)

## 2021-06-27 LAB — GLUCOSE, CAPILLARY
Glucose-Capillary: 120 mg/dL — ABNORMAL HIGH (ref 70–99)
Glucose-Capillary: 135 mg/dL — ABNORMAL HIGH (ref 70–99)

## 2021-06-27 MED ORDER — SODIUM CHLORIDE 0.9 % IV SOLN: 2.0000 g | INTRAVENOUS | Status: DC

## 2021-06-27 MED ORDER — LIDOCAINE HCL 1 % IJ SOLN
INTRAMUSCULAR | Status: AC
  Administered 2021-06-27: 10 mL
  Filled 2021-06-27: qty 20

## 2021-06-27 MED ORDER — CEFTRIAXONE IV (FOR PTA / DISCHARGE USE ONLY): 2.0000 g | INTRAVENOUS | 0 refills | Status: AC

## 2021-06-27 MED ORDER — HEPARIN SOD (PORK) LOCK FLUSH 100 UNIT/ML IV SOLN
INTRAVENOUS | Status: AC
  Administered 2021-06-27: 200 [IU]
  Filled 2021-06-27: qty 5

## 2021-06-27 MED ORDER — MIDAZOLAM HCL 5 MG/5ML IJ SOLN
INTRAMUSCULAR | Status: AC | PRN
  Administered 2021-06-27 (×2): 1 mg via INTRAVENOUS

## 2021-06-27 MED ORDER — MIDAZOLAM HCL 2 MG/2ML IJ SOLN
INTRAMUSCULAR | Status: AC
  Filled 2021-06-27: qty 2

## 2021-06-27 MED ORDER — LIDOCAINE-EPINEPHRINE 1 %-1:100000 IJ SOLN
INTRAMUSCULAR | Status: AC
  Filled 2021-06-27: qty 1

## 2021-06-27 MED ORDER — FENTANYL CITRATE (PF) 100 MCG/2ML IJ SOLN
INTRAMUSCULAR | Status: AC
  Filled 2021-06-27: qty 2

## 2021-06-27 MED ORDER — NOVOLIN N FLEXPEN 100 UNIT/ML ~~LOC~~ SUPN: 50.0000 [IU] | PEN_INJECTOR | SUBCUTANEOUS | 3 refills | Status: DC

## 2021-06-27 MED ORDER — FENTANYL CITRATE (PF) 100 MCG/2ML IJ SOLN
INTRAMUSCULAR | Status: AC | PRN
  Administered 2021-06-27: 50 ug via INTRAVENOUS
  Administered 2021-06-27 (×2): 25 ug via INTRAVENOUS

## 2021-06-27 NOTE — Procedures (Signed)
Interventional Radiology Procedure:   Indications: Septic arthritis of left elbow  Procedure: Tunneled central line placement  Findings: Right jugular single lumen Powerline placed.  Length 25 cm, tip at SVC/RA junction.  Complications: No immediate complications noted.     EBL: Minimal  Plan: Central line is ready to use.     Chuong Casebeer R. Anselm Pancoast, MD  Pager: [phone]

## 2021-06-27 NOTE — Progress Notes (Signed)
°  Subjective:  Patient reports pain as mild.    Objective:   VITALS:   Vitals:   06/26/21 1156 06/26/21 1616 06/26/21 2022 06/27/21 0343  BP: (!) 157/85 (!) 159/73 (!) 172/76 (!) 161/82  Pulse: 70 73 66 70  Resp: 15 18 18 14   Temp: 98.1 F (36.7 C) 97.7 F (36.5 C) 98.1 F (36.7 C) 98.2 F (36.8 C)  TempSrc: Oral Oral    SpO2: 99% 99% 95% 99%  Weight:      Height:        PHYSICAL EXAM:  Neurologically intact ABD soft Neurovascular intact Sensation intact distally Intact pulses distally Dorsiflexion/Plantar flexion intact Incision: dressing C/D/I No cellulitis present Compartment soft  LABS  Results for orders placed or performed during the hospital encounter of 06/23/21 (from the past 24 hour(s))  Glucose, capillary     Status: Abnormal   Collection Time: 06/26/21  8:01 AM  Result Value Ref Range   Glucose-Capillary 105 (H) 70 - 99 mg/dL  Glucose, capillary     Status: Abnormal   Collection Time: 06/26/21 11:58 AM  Result Value Ref Range   Glucose-Capillary 105 (H) 70 - 99 mg/dL  Glucose, capillary     Status: None   Collection Time: 06/26/21  4:20 PM  Result Value Ref Range   Glucose-Capillary 98 70 - 99 mg/dL  Glucose, capillary     Status: None   Collection Time: 06/26/21  9:11 PM  Result Value Ref Range   Glucose-Capillary 98 70 - 99 mg/dL  Sedimentation rate     Status: Abnormal   Collection Time: 06/27/21  4:18 AM  Result Value Ref Range   Sed Rate 56 (H) 0 - 20 mm/hr    No results found.  Assessment/Plan: 3 Days Post-Op   Principal Problem:   Septic arthritis of elbow, left (HCC) Active Problems:   GERD (gastroesophageal reflux disease)   Type 2 diabetes mellitus (HCC)   Essential hypertension, benign   Mixed hyperlipidemia   History of renal transplant   Peripheral vascular disease ([place])   Hypomagnesemia    Discharge home with home health when cleared by primary team RTC in 2 weeks for suture removal Dressing changed Follow up  in our office on 07/05/21.  Call to confirm appointment [phone]  Carlynn Spry , PA-C 06/27/2021, 6:16 AM

## 2021-06-27 NOTE — TOC Progression Note (Signed)
Transition of Care [facility]) - Progression Note    Patient Details  Name: Raymond [name] MRN: [id number] Date of Birth: 1960-11-02  Transition of Care [facility]) CM/SW [place], RN Phone Number: 06/27/2021, 9:18 AM  Clinical Narrative:    Pam with advanced home infusion is going to work with the patient and teach for Home IV ABX,  The patient no longer has insurance, He will be a self pay patient  He will need nursing from [facility] for PICC line dressing change        Expected Discharge Plan and Services                                                 Social Determinants of Health (SDOH) Interventions    Readmission Risk Interventions No flowsheet data found.

## 2021-06-27 NOTE — TOC Progression Note (Signed)
Transition of Care [facility]) - Progression Note    Patient Details  Name: Raymond [name] MRN: [id number] Date of Birth: 05-11-1961  Transition of Care [facility]) CM/SW [place], RN Phone Number: 06/27/2021, 1:56 PM  Clinical Narrative:   Patient will go to Same Day Surgery to have PICC line dressing change and labs drawn on 2/1 and 2/17, to see ID on 2/23 IR scheduled to remove the PICC on 2/24          Expected Discharge Plan and Services           Expected Discharge Date: 06/27/21                                     Social Determinants of Health (SDOH) Interventions    Readmission Risk Interventions No flowsheet data found.

## 2021-06-27 NOTE — Progress Notes (Signed)
Patient off unit for IJ placement

## 2021-06-27 NOTE — Discharge Summary (Signed)
Physician Discharge Summary  Rodman Recupero OEU:[id number] DOB: 06-10-1960 DOA: 06/23/2021  PCP: [name], FNP  Admit date: 06/23/2021 Discharge date: 06/27/2021  Admitted From: Home Discharge disposition: Home with home health services   Code Status: Full Code   Discharge Diagnosis:   Principal Problem:   Septic arthritis of elbow, left ([place]) Active Problems:   GERD (gastroesophageal reflux disease)   Type 2 diabetes mellitus ([place])   Essential hypertension, benign   Mixed hyperlipidemia   History of renal transplant   Peripheral vascular disease ([place])   Hypomagnesemia    Chief Complaint  Patient presents with   left arm swelling   Brief narrative: Floyde Dingley is a 61 y.o. male with PMH significant for DM2, HTN, CKD 3a s/p status post renal transplant  Patient presented to the ED on 06/23/2021 with complaint of acute worsening of left elbow swelling and pain for about a week.  Prior to admission, patient had completed a course of colchicine and was also given a dose of IM Rocephin without benefit.  In the ED, left elbow x-ray showed posterior soft tissue swelling, joint effusion with no acute bony abnormality and stable chronic soft tissue densities and calcifications. Patient was started on broad-spectrum antibiotics, IV Dilaudid. Patient underwent arthrocentesis by ER physician and culture was sent. Admitted to hospital service.  Orthopedics was consulted.  Subjective: Patient was seen and examined this morning.  Pleasant middle-aged African-American male.   Sitting up in chair.  Got a tunneled RIJ catheter by IR this am.   Hospital course: Acute septic arthritis of left elbow -Presented with worsening pain, swelling of left elbow for more than a week, not responding to outpatient course of colchicine and antibiotic. -Joint aspirated in the ED.  Fluid culture grew rare strep agalactiae -Blood culture negative. -ID consultation was obtained.  Recommended IV  Rocephin 2 g IV daily for 4 weeks to complete on 07/19/2021 -Nephrology recommended against putting PICC line because of potential need of dialysis in the future. -Got a tunneled RIJ catheter by IR this am.    CKD 3a Renal transplant status -Creatinine stable.  Continue tacrolimus, sodium bicarbonate. Recent Labs    12/11/20 1005 12/13/20 1608 01/31/21 1040 02/28/21 1006 03/26/21 1513 06/23/21 2115 06/24/21 0653 06/25/21 0352 06/26/21 0306  BUN 32* 30* 31* 30* 20 30* 26* 18 20  CREATININE 1.34* 1.45* 1.25 1.30* 1.30 1.41* 1.12 1.03 1.05   Uncontrolled type 2 diabetes mellitus -A1c 10.6 on 06/24/2021, it seems better than A1c of 15.5 in October 2022 -Home regimen is NPH Insulin 154 units Daily,Trulicity weekly, glipizide 5 mg Daily -Currently his blood sugar is running consistently in normal range with reduced dose of NPH at 50 units daily with sliding scale insulin.  Trulicity and glipizide on hold.  Apparently patient had episodes of hypoglycemia at home.  I wonder about his compliance to medications at home. -So at discharge, I would keep him on NPH insulin 50 units daily and resume Trulicity.  Stop glipizide. Recent Labs  Lab 06/26/21 0801 06/26/21 1158 06/26/21 1620 06/26/21 2111 06/27/21 0816  GLUCAP 105* 105* 98 98 120*   Essential hypertension -Home meds include clonidine 0.3 mg/24 patch weekly, doxazosin 6 mg daily, hydralazine 25 mg twice daily -Currently continued on all.  Hyperlipidemia -Continue Lipitor  Hypomagnesemia -Magnesium level is 1.4 yesterday.  Replaced. Recent Labs  Lab 06/23/21 2115 06/24/21 0653 06/25/21 0352 06/26/21 0306  K 4.4 4.5 4.3 4.3  MG  --   --  1.4* 1.9   Mobility: Encourage ambulation Living condition: Lives at home with wife Goals of care:   Code Status: Full Code  Nutritional status: Body mass index is 36.02 kg/m.      Discharge Medications:   Allergies as of 06/27/2021   No Known Allergies      Medication List      STOP taking these medications    glipiZIDE 5 MG 24 hr tablet Commonly known as: GLUCOTROL XL       TAKE these medications    Accu-Chek Guide Me w/Device Kit 1 Piece by Does not apply route as directed.   atorvastatin 10 MG tablet Commonly known as: LIPITOR Take 1 tablet (10 mg total) by mouth daily.   blood glucose meter kit and supplies Kit Dispense based on patient and insurance preference. Use up to four times daily as directed. (FOR ICD-9 250.00, 250.01).   cefTRIAXone  IVPB Commonly known as: ROCEPHIN Inject 2 g into the vein daily for 23 days. Indication:  Left elbow septic arthritis  First Dose: Yes Last Day of Therapy:  07/19/21 Labs weekly while on IV antibiotics: _X_ CBC with differential _X_ CMP _X_ ESR _X_ Please pull central line  at completion of IV antibiotics- at Intervention radiology with radiology on 07/19/21 to remove the line Method of administration: IV Push Method of administration may be changed at the discretion of home infusion pharmacist based upon assessment of the patient and/or caregiver's ability to self-administer the medication ordered.   cloNIDine 0.3 mg/24hr patch Commonly known as: CATAPRES - Dosed in mg/24 hr Place 0.3 mg onto the skin once a week.   colchicine 0.6 MG tablet Take 0.6 mg by mouth daily.   doxazosin 4 MG tablet Commonly known as: CARDURA Take 6 mg by mouth daily.   FreeStyle Libre 2 Reader Kerrin Mo As directed   [employer] 2 Sensor Misc 1 Piece by Does not apply route every 14 (fourteen) days.   glucose blood test strip Commonly known as: Accu-Chek Guide Use as instructed 4 x daily. E11.65   hydrALAZINE 25 MG tablet Commonly known as: APRESOLINE Take 25 mg by mouth 2 (two) times daily.   HYDROcodone-acetaminophen 5-325 MG tablet Commonly known as: NORCO/VICODIN Take 1 tablet by mouth every 6 (six) hours as needed.   multivitamin capsule Take 1 capsule by mouth daily.   NovoLIN N FlexPen 100 UNIT/ML  Kiwkpen Generic drug: Insulin NPH (Human) (Isophane) Inject 50 Units into the skin every morning. What changed: how much to take   silver sulfADIAZINE 1 % cream Commonly known as: Silvadene Apply 1 application topically daily.   sodium bicarbonate 650 MG tablet Take 1,300 mg by mouth 3 (three) times daily.   tacrolimus 1 MG capsule Commonly known as: PROGRAF Take 3 mg by mouth 2 (two) times daily. Take 3 capsules (46m) in AM & take 3 caps (380m in PM   triamcinolone 0.025 % ointment Commonly known as: KENALOG Apply 1 application topically 3 (three) times daily.   Trulicity 0.2.13GYQ/6.5HQopn Generic drug: Dulaglutide Inject 0.75 mg into the skin once a week.   Vitamin D (Ergocalciferol) 1.25 MG (50000 UNIT) Caps capsule Commonly known as: DRISDOL Take 50,000 Units by mouth every 30 (thirty) days.               Discharge Care Instructions  (From admission, onward)           Start     Ordered   06/27/21 0000  Change dressing  on IV access line weekly and PRN  (Home infusion instructions - [facility] )        06/27/21 1130   06/27/21 0000  Discharge wound care:        06/27/21 1130            Wound care:   Wound / Incision (Open or Dehisced) 01/13/20 Diabetic ulcer (Active)  Date First Assessed/Time First Assessed: 01/13/20 0800   Wound Type: Diabetic ulcer    Assessments 01/13/2020 11:00 AM 01/20/2020  4:16 PM  Dressing Type Other (Comment);Gauze (Comment) Gauze (Comment)  Dressing Changed New --  Dressing Status Clean;Dry;Intact --  Dressing Change Frequency PRN --     No Linked orders to display     Incision (Closed) 03/06/21 Foot Right (Active)  Date First Assessed/Time First Assessed: 03/06/21 1432   Location: Foot  Location Orientation: Right    Assessments 03/06/2021  2:30 PM 03/06/2021  4:15 PM  Dressing Type Compression wrap Compression wrap  Dressing Clean;Dry;Intact Clean;Dry;Intact  Site / Wound Assessment Dressing in place /  Unable to assess Dressing in place / Unable to assess  Drainage Amount None None     No Linked orders to display     Incision (Closed) 06/24/21 Elbow Left (Active)  Date First Assessed/Time First Assessed: 06/24/21 1743   Location: Elbow  Location Orientation: Left    Assessments 06/24/2021  6:17 PM 06/27/2021  8:04 AM  Dressing Type Bismuth petroleum;Gauze (Comment);Abdominal pads;Compression wrap Hydrogel  Dressing -- Clean;Dry;Intact;Old drainage (marked)     No Linked orders to display    Discharge Instructions:   Discharge Instructions     [facility] pharmacist to adjust dose for Vancomycin, Aminoglycosides and other anti-infective therapies as requested by physician.   Complete by: As directed    [facility] to provide Cath Flo 98m   Complete by: As directed    Administer for PICC line occlusion and as ordered by physician for other access device issues.   Anaphylaxis Kit: Provided to treat any anaphylactic reaction to the medication being provided to the patient if First Dose or when requested by physician   Complete by: As directed    Epinephrine 132mml vial / amp: Administer 0.64m37m0.64ml84mubcutaneously once for moderate to severe anaphylaxis, nurse to call physician and pharmacy when reaction occurs and call 911 if needed for immediate care   Diphenhydramine 50mg90mIV vial: Administer 25-50mg 72mM PRN for first dose reaction, rash, itching, mild reaction, nurse to call physician and pharmacy when reaction occurs   Sodium Chloride 0.9% NS 500ml [id number] if needed for hypovolemic blood pressure drop or as ordered by physician after call to physician with anaphylactic reaction   Call MD for:  difficulty breathing, headache or visual disturbances   Complete by: As directed    Call MD for:  extreme fatigue   Complete by: As directed    Call MD for:  hives   Complete by: As directed    Call MD for:  persistant dizziness or light-headedness   Complete  by: As directed    Call MD for:  persistant nausea and vomiting   Complete by: As directed    Call MD for:  severe uncontrolled pain   Complete by: As directed    Call MD for:  temperature >100.4   Complete by: As directed    Change dressing on IV access line weekly and PRN   Complete by: As directed    Diet Carb  Modified   Complete by: As directed    Discharge instructions   Complete by: As directed    NPH insulin dose has been reduced to 50 units daily continue Trulicity.  Stop glipizide.  Discharge instructions for diabetes mellitus: Check blood sugar 3 times a day and bedtime at home. If blood sugar running above 200 or less than 70 please call your MD to adjust insulin. If you notice signs and symptoms of hypoglycemia (low blood sugar) like jitteriness, confusion, thirst, tremor and sweating, please check blood sugar, drink sugary drink/biscuits/sweets to increase sugar level and call MD or return to ER.    General discharge instructions:  Follow with Primary MD [name], FNP in 7 days   Get CBC/BMP checked in next visit within 1 week by PCP or SNF MD. (We routinely change or add medications that can affect your baseline labs and fluid status, therefore we recommend that you get the mentioned basic workup next visit with your PCP, your PCP may decide not to get them or add new tests based on their clinical decision)  On your next visit with your PCP, please get your medicines reviewed and adjusted.  Please request your PCP  to go over all hospital tests, procedures, radiology results at the follow up, please get all Hospital records sent to your PCP by signing hospital release before you go home.  Activity: As tolerated with Full fall precautions use walker/cane & assistance as needed  Avoid using any recreational substances like cigarette, tobacco, alcohol, or non-prescribed drug.  If you experience worsening of your admission symptoms, develop shortness of breath,  life threatening emergency, suicidal or homicidal thoughts you must seek medical attention immediately by calling 911 or calling your MD immediately  if symptoms less severe.  You must read complete instructions/literature along with all the possible adverse reactions/side effects for all the medicines you take and that have been prescribed to you. Take any new medicine only after you have completely understood and accepted all the possible adverse reactions/side effects.   Do not drive, operate heavy machinery, perform activities at heights, swimming or participation in water activities or provide baby sitting services if your were admitted for syncope or siezures until you have seen by Primary MD or a Neurologist and advised to do so again.  Do not drive when taking Pain medications.  Do not take more than prescribed Pain, Sleep and Anxiety Medications  Wear Seat belts while driving.  Please note You were cared for by a hospitalist during your hospital stay. If you have any questions about your discharge medications or the care you received while you were in the hospital after you are discharged, you can call the unit and asked to speak with the hospitalist on call if the hospitalist that took care of you is not available. Once you are discharged, your primary care physician will handle any further medical issues. Please note that NO REFILLS for any discharge medications will be authorized once you are discharged, as it is imperative that you return to your primary care physician (or establish a relationship with a primary care physician if you do not have one) for your aftercare needs so that they can reassess your need for medications and monitor your lab values.   Discharge wound care:   Complete by: As directed    Flush IV access with Sodium Chloride 0.9% and Heparin 10 units/ml or 100 units/ml   Complete by: As directed    Home infusion instructions -  [facility]   Complete by: As  directed    Instructions: Flush IV access with Sodium Chloride 0.9% and Heparin 10units/ml or 100units/ml   Change dressing on IV access line: Weekly and PRN   Instructions Cath Flo 35m: Administer for PICC Line occlusion and as ordered by physician for other access device   [facility] pharmacist to adjust dose for: Vancomycin, Aminoglycosides and other anti-infective therapies as requested by physician   Increase activity slowly   Complete by: As directed    Method of administration may be changed at the discretion of home infusion pharmacist based upon assessment of the patient and/or caregivers ability to self-administer the medication ordered   Complete by: As directed        Follow ups:    Discharge Exam:   Vitals:   06/27/21 0932 06/27/21 0941 06/27/21 0957 06/27/21 1111  BP: (!) 202/90 (!) 199/92 (!) 199/87 (!) 161/87  Pulse: 68 66 69 72  Resp: _0 Temp:   98.1 F (36.7 C) 98.6 F (37 C)  TempSrc:   Oral   SpO2: 100% 99% 96% 94%  Weight:      Height:        Body mass index is 36.02 kg/m.   General exam: Pleasant, middle-aged African-American male.  Not in distress Skin: No rashes, lesions or ulcers. HEENT: Atraumatic, normocephalic, no obvious bleeding Lungs: Clear to auscultation bilaterally.  Right anterior chest wall catheter site intact.  No evidence of bleeding. CVS: Regular rate and rhythm, no murmur GI/Abd soft, nontender, nondistended, bowel sound present CNS: Alert, awake, oriented x3 Psychiatry: Mood appropriate Extremities: Left elbow has compression bandages on  Time coordinating discharge: 35 minutes   The results of significant diagnostics from this hospitalization (including imaging, microbiology, ancillary and laboratory) are listed below for reference.    Procedures and Diagnostic Studies:   DG Elbow Complete Left  Result Date: 06/24/2021 CLINICAL DATA:  Left elbow pain and swelling. EXAM: LEFT ELBOW - COMPLETE 3+ VIEW  COMPARISON:  Recent study 2 days ago 06/21/2021 FINDINGS: Circumferential soft tissue edema is again noted greatest posteriorly. There are positive fat pad signs again seen consistent with a joint effusion. There is no evidence of fractures or erosive arthropathy with mild narrowing and trace spurring again at the trochleoulnar joint. There is no dislocation. Calcific plaques in the brachial artery, radial, ulnar and interosseous arteries are again shown. Subcutaneous soft tissue mass measuring 2.2 cm again noted in the antecubital fossa, with scattered coarse dystrophic calcifications. Another is seen in the ventrolateral upper arm measuring up to 5.5 cm and a third mass, also with dystrophic calcifications is partially visible just above this. All of these were seen previously. There have been no interval radiographic changes. IMPRESSION: 1. No fracture or destructive lesion is seen. 2. Calcific arteriosclerosis. 3. Again noted joint effusion. 4. Diffuse edema greater posteriorly with stable subcutaneous soft tissue masses with dystrophic calcifications. Electronically Signed   By: KTelford NabM.D.   On: 06/24/2021 00:09   UKoreaVenous Img Upper Uni Left  Result Date: 06/23/2021 CLINICAL DATA:  Left upper extremity swelling, known arteriovenous fistula EXAM: LEFT UPPER EXTREMITY VENOUS DOPPLER ULTRASOUND TECHNIQUE: Gray-scale sonography with graded compression, as well as color Doppler and duplex ultrasound were performed to evaluate the upper extremity deep venous system from the level of the subclavian vein and including the jugular, axillary, basilic, radial, ulnar and upper cephalic vein. Spectral Doppler was utilized to evaluate flow  at rest and with distal augmentation maneuvers. COMPARISON:  None. FINDINGS: Contralateral Subclavian Vein: Respiratory phasicity is normal and symmetric with the symptomatic side. No evidence of thrombus. Normal compressibility. Internal Jugular Vein: No evidence of  thrombus. Normal compressibility, respiratory phasicity and response to augmentation. Subclavian Vein: No evidence of thrombus. Normal compressibility, respiratory phasicity and response to augmentation. Axillary Vein: No evidence of thrombus. Normal compressibility, respiratory phasicity and response to augmentation. Cephalic Vein: No evidence of thrombus. Normal compressibility, respiratory phasicity and response to augmentation. Basilic Vein: No evidence of thrombus. Normal compressibility, respiratory phasicity and response to augmentation. Brachial Veins: No evidence of thrombus. Normal compressibility, respiratory phasicity and response to augmentation. Radial Veins: No evidence of thrombus. Normal compressibility, respiratory phasicity and response to augmentation. Ulnar Veins: No evidence of thrombus. Normal compressibility, respiratory phasicity and response to augmentation. Venous Reflux:  None visualized. 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 Mild dilatation is noted in its proximal segment. IMPRESSION: No evidence of DVT within the left upper extremity. Patent arteriovenous fistula. Electronically Signed   By: Inez Catalina M.D.   On: 06/23/2021 23:21     Labs:   Basic Metabolic Panel: Recent Labs  Lab 06/23/21 2115 06/24/21 0653 06/25/21 0352 06/26/21 0306  NA 140 139 139 137  K 4.4 4.5 4.3 4.3  CL 111 109 112* 109  CO2 23 21* 21* 22  GLUCOSE 312* 158* 51* 128*  BUN 30* 26* 18 20  CREATININE 1.41* 1.12 1.03 1.05  CALCIUM 9.4 9.6 8.8* 9.1  MG  --   --  1.4* 1.9   GFR Estimated Creatinine Clearance: 86.1 mL/min (by C-G formula based on SCr of 1.05 mg/dL). Liver Function Tests: Recent Labs  Lab 06/23/21 2115  AST 26  ALT 37  ALKPHOS 119  BILITOT 0.3  PROT 7.7  ALBUMIN 2.9*   No results for input(s): LIPASE, AMYLASE in the last 168 hours. No results for input(s): AMMONIA in the last 168 hours. Coagulation profile No results for input(s): INR,  PROTIME in the last 168 hours.  CBC: Recent Labs  Lab 06/23/21 2115 06/24/21 0653 06/25/21 0352 06/26/21 0306  WBC 7.2 7.8 8.4 7.4  NEUTROABS 4.6  --   --   --   HGB 12.0* 13.7 11.4* 11.5*  HCT 38.7* 45.4 37.5* 36.6*  MCV 81.1 81.8 80.8 82.6  PLT 198 228 189 199   Cardiac Enzymes: No results for input(s): CKTOTAL, CKMB, CKMBINDEX, TROPONINI in the last 168 hours. BNP: Invalid input(s): POCBNP CBG: Recent Labs  Lab 06/26/21 0801 06/26/21 1158 06/26/21 1620 06/26/21 2111 06/27/21 0816  GLUCAP 105* 105* 98 98 120*   D-Dimer No results for input(s): DDIMER in the last 72 hours. Hgb A1c No results for input(s): HGBA1C in the last 72 hours. Lipid Profile No results for input(s): CHOL, HDL, LDLCALC, TRIG, CHOLHDL, LDLDIRECT in the last 72 hours. Thyroid function studies No results for input(s): TSH, T4TOTAL, T3FREE, THYROIDAB in the last 72 hours.  Invalid input(s): FREET3 Anemia work up No results for input(s): VITAMINB12, FOLATE, FERRITIN, TIBC, IRON, RETICCTPCT in the last 72 hours. Microbiology Recent Results (from the past 240 hour(s))  Body fluid culture w Gram Stain     Status: None   Collection Time: 06/24/21  1:04 AM   Specimen: Joint, Elbow; Body Fluid  Result Value Ref Range Status   Specimen Description   Final    JOINT FLUID ELBOW Performed at [facility], [address], [place], [place] [address]  Special Requests   Final    JOINT FLUID ELBOW Performed at [facility], [place], [place] [address]    Gram Stain   Final    GRAM POSITIVE COCCI WBC SEEN RED BLOOD CELLS CRITICAL RESULT CALLED TO, READ BACK BY AND VERIFIED WITH: TONNY WALKER AT 2202 ON 06/24/21 BY SS GRAM STAIN REVIEWED-AGREE WITH RESULT    Culture   Final    RARE GROUP B STREP(S.AGALACTIAE)ISOLATED TESTING AGAINST S. AGALACTIAE NOT ROUTINELY PERFORMED DUE TO PREDICTABILITY OF AMP/PEN/VAN SUSCEPTIBILITY. Performed at [facility],  [place] [address], [place], [place] [address]    Report Status 06/26/2021 FINAL  Final  Gram stain     Status: None   Collection Time: 06/24/21  1:04 AM  Result Value Ref Range Status   Specimen Description JOINT FLUID ELBOW  Final   Special Requests JOINT FLUID ELBOW  Final   Gram Stain   Final    GRAM POSITIVE COCCI WBC SEEN RED BLOOD CELLS CRITICAL RESULT CALLED TO, READ BACK BY AND VERIFIED WITH: TONNY WALKER AT 6237 ON 06/24/21 BY SS Performed at [facility], [address] [place], [place] [address]    Report Status 06/24/2021 FINAL  Final  Culture, blood (Routine X 2) w Reflex to ID Panel     Status: None (Preliminary result)   Collection Time: 06/24/21  2:58 AM   Specimen: BLOOD  Result Value Ref Range Status   Specimen Description BLOOD BLOOD RIGHT FOREARM  Final   Special Requests   Final    BOTTLES DRAWN AEROBIC AND ANAEROBIC Blood Culture adequate volume   Culture   Final    NO GROWTH 2 DAYS Performed at [facility], [address], [place], [place] [address]    Report Status PENDING  Incomplete  Resp Panel by RT-PCR (Flu A&B, Covid) Nasopharyngeal Swab     Status: None   Collection Time: 06/24/21  3:02 AM   Specimen: Nasopharyngeal Swab; Nasopharyngeal(NP) swabs in vial transport medium  Result Value Ref Range Status   SARS Coronavirus 2 by RT PCR NEGATIVE NEGATIVE Final    Comment: (NOTE) SARS-CoV-2 target nucleic acids are NOT DETECTED.  The SARS-CoV-2 RNA is generally detectable in upper respiratory specimens during the acute phase of infection. The lowest concentration of SARS-CoV-2 viral copies this assay can detect is 138 copies/mL. A negative result does not preclude SARS-Cov-2 infection and should not be used as the sole basis for treatment or other patient management decisions. A negative result may occur with  improper specimen collection/handling, submission of specimen other than nasopharyngeal swab, presence of viral mutation(s)  within the areas targeted by this assay, and inadequate number of viral copies(<138 copies/mL). A negative result must be combined with clinical observations, patient history, and epidemiological information. The expected result is Negative.  Fact Sheet for Patients:  EntrepreneurPulse.com.au  Fact Sheet for Healthcare Providers:  IncredibleEmployment.be  This test is no t yet approved or cleared by the [place] FDA and  has been authorized for detection and/or diagnosis of SARS-CoV-2 by FDA under an Emergency Use Authorization (EUA). This EUA will remain  in effect (meaning this test can be used) for the duration of the COVID-19 declaration under Section 564(b)(1) of the Act, 21 U.S.C.section 360bbb-3(b)(1), unless the authorization is terminated  or revoked sooner.       Influenza A by PCR NEGATIVE NEGATIVE Final   Influenza B by PCR NEGATIVE NEGATIVE Final    Comment: (NOTE)  The Xpert Xpress SARS-CoV-2/FLU/RSV plus assay is intended as an aid in the diagnosis of influenza from Nasopharyngeal swab specimens and should not be used as a sole basis for treatment. Nasal washings and aspirates are unacceptable for Xpert Xpress SARS-CoV-2/FLU/RSV testing.  Fact Sheet for Patients: EntrepreneurPulse.com.au  Fact Sheet for Healthcare Providers: IncredibleEmployment.be  This test is not yet approved or cleared by the [place] FDA and has been authorized for detection and/or diagnosis of SARS-CoV-2 by FDA under an Emergency Use Authorization (EUA). This EUA will remain in effect (meaning this test can be used) for the duration of the COVID-19 declaration under Section 564(b)(1) of the Act, 21 U.S.C. section 360bbb-3(b)(1), unless the authorization is terminated or revoked.  Performed at [facility], [address] [place], [place] [address]   Culture, blood (Routine X 2) w Reflex to ID  Panel     Status: None (Preliminary result)   Collection Time: 06/24/21  3:08 AM   Specimen: BLOOD  Result Value Ref Range Status   Specimen Description BLOOD BLOOD RIGHT WRIST  Final   Special Requests   Final    BOTTLES DRAWN AEROBIC AND ANAEROBIC Blood Culture adequate volume   Culture   Final    NO GROWTH 2 DAYS Performed at [facility], [address] [place], [place] [address]    Report Status PENDING  Incomplete  Aerobic/Anaerobic Culture w Gram Stain (surgical/deep wound)     Status: None (Preliminary result)   Collection Time: 06/24/21  5:44 PM   Specimen: PATH Other; Wound  Result Value Ref Range Status   Specimen Description   Final    ARM Performed at [facility], [address] [place], [place] [address]    Special Requests   Final    NONE Performed at [facility], [place], [place] [address]    Gram Stain   Final    NO SQUAMOUS EPITHELIAL CELLS SEEN FEW WBC SEEN NO ORGANISMS SEEN    Culture   Final    NO GROWTH 1 DAY Performed at [facility], [place] [address], [place], [place] [address]    Report Status PENDING  Incomplete     Signed: Marlowe Aschoff Antonette Hendricks  [facility] 06/27/2021, 11:30 AM

## 2021-06-28 LAB — GLUCOSE, CAPILLARY: Glucose-Capillary: 40 mg/dL — CL (ref 70–99)

## 2021-06-29 LAB — CULTURE, BLOOD (ROUTINE X 2)
Culture: NO GROWTH
Culture: NO GROWTH
Special Requests: ADEQUATE
Special Requests: ADEQUATE

## 2021-06-30 LAB — AEROBIC/ANAEROBIC CULTURE W GRAM STAIN (SURGICAL/DEEP WOUND)
Culture: NO GROWTH
Gram Stain: NONE SEEN

## 2021-07-01 ENCOUNTER — Telehealth: Payer: Self-pay

## 2021-07-01 NOTE — Telephone Encounter (Signed)
Transition Care Management Follow-up Telephone Call Date of discharge and from where: 06/27/2020 [facility]  How have you been since you were released from the hospital? Pt states he is okay, still painful.  Any questions or concerns? Yes  Items Reviewed: Did the pt receive and understand the discharge instructions provided? Yes  Medications obtained and verified? Yes  Other? Yes  Any new allergies since your discharge? No  Dietary orders reviewed? Yes Do you have support at home? Yes   Home Care and Equipment/Supplies: Were home health services ordered? yes If so, what is the name of the agency? N/a   Has the agency set up a time to come to the patient's home? no Were any new equipment or medical supplies ordered?  No What is the name of the medical supply agency? N/a  Were you able to get the supplies/equipment? not applicable Do you have any questions related to the use of the equipment or supplies? No  Functional Questionnaire: (I = Independent and D = Dependent) ADLs: d  Bathing/Dressing- d  Meal Prep- d  Eating- d  Maintaining continence- d  Transferring/Ambulation- d  Managing Meds- d  Follow up appointments reviewed:  PCP Hospital f/u appt confirmed? No  Scheduled to see n/a  on n/a  @ n/a . [facility] f/u appt confirmed? Yes  Scheduled to see Dr Harlow Mares  on n/a  @ n/a . Are transportation arrangements needed? Yes  If their condition worsens, is the pt aware to call PCP or go to the Emergency Dept.? Yes Was the patient provided with contact information for the PCP's office or ED? Yes Was to pt encouraged to call back with questions or concerns? Yes

## 2021-07-02 ENCOUNTER — Encounter: Payer: Self-pay | Admitting: Nurse Practitioner

## 2021-07-03 ENCOUNTER — Telehealth: Payer: Self-pay

## 2021-07-03 NOTE — Telephone Encounter (Signed)
Left voicemail with scheduling at [facility] asking for a call back to schedule appointment for patient to have tunneled cath removed.   Phone number [phone]   [name] [name], CMA      Tsosie Billing, MD  P Rcid Triage Nurse Pool Pt has a tunneled cath for IV antibiotics at home- Please schedule for its removal by intervention radiologist at [facility] on 07/19/21. thx

## 2021-07-04 ENCOUNTER — Ambulatory Visit: Payer: Self-pay | Admitting: Nurse Practitioner

## 2021-07-04 ENCOUNTER — Other Ambulatory Visit: Payer: Self-pay | Admitting: Infectious Diseases

## 2021-07-04 DIAGNOSIS — M00222 Other streptococcal arthritis, left elbow: Secondary | ICD-10-CM

## 2021-07-04 NOTE — Telephone Encounter (Signed)
I attempted to contact patient to relay his appointment to him to have Cath removed. Patient did not answer and his voicemail box is full. Patient is scheduled with [facility] on 07/19/21 and arrival time 9:00 am. Order has also been corrected as well and signed off on by Dr. Delaine Lame [name]

## 2021-07-05 ENCOUNTER — Ambulatory Visit
Admission: RE | Admit: 2021-07-05 | Discharge: 2021-07-05 | Disposition: A | Discharge status: Home / Self Care | Payer: Self-pay | Source: Ambulatory Visit | Attending: Infectious Diseases | Admitting: Infectious Diseases

## 2021-07-05 DIAGNOSIS — L089 Local infection of the skin and subcutaneous tissue, unspecified: Secondary | ICD-10-CM

## 2021-07-08 ENCOUNTER — Ambulatory Visit
Admission: RE | Admit: 2021-07-08 | Discharge: 2021-07-08 | Disposition: A | Discharge status: Home / Self Care | Payer: Self-pay | Source: Ambulatory Visit | Attending: Infectious Diseases | Admitting: Infectious Diseases

## 2021-07-08 ENCOUNTER — Other Ambulatory Visit: Payer: Self-pay

## 2021-07-08 VITALS — BP 189/78 | HR 99 | Temp 98.6°F | Resp 18 | Ht 67.0 in | Wt 229.9 lb

## 2021-07-08 DIAGNOSIS — Y838 Other surgical procedures as the cause of abnormal reaction of the patient, or of later complication, without mention of misadventure at the time of the procedure: Secondary | ICD-10-CM | POA: Insufficient documentation

## 2021-07-08 DIAGNOSIS — T8149XA Infection following a procedure, other surgical site, initial encounter: Secondary | ICD-10-CM | POA: Insufficient documentation

## 2021-07-08 DIAGNOSIS — T148XXA Other injury of unspecified body region, initial encounter: Secondary | ICD-10-CM

## 2021-07-08 DIAGNOSIS — L089 Local infection of the skin and subcutaneous tissue, unspecified: Secondary | ICD-10-CM | POA: Insufficient documentation

## 2021-07-08 LAB — CBC WITH DIFFERENTIAL/PLATELET
Abs Immature Granulocytes: 0.02 10*3/uL (ref 0.00–0.07)
Basophils Absolute: 0 10*3/uL (ref 0.0–0.1)
Basophils Relative: 1 %
Eosinophils Absolute: 0.2 10*3/uL (ref 0.0–0.5)
Eosinophils Relative: 4 %
HCT: 40.3 % (ref 39.0–52.0)
Hemoglobin: 11.8 g/dL — ABNORMAL LOW (ref 13.0–17.0)
Immature Granulocytes: 1 %
Lymphocytes Relative: 35 %
Lymphs Abs: 1.5 10*3/uL (ref 0.7–4.0)
MCH: 25.1 pg — ABNORMAL LOW (ref 26.0–34.0)
MCHC: 29.3 g/dL — ABNORMAL LOW (ref 30.0–36.0)
MCV: 85.7 fL (ref 80.0–100.0)
Monocytes Absolute: 0.4 10*3/uL (ref 0.1–1.0)
Monocytes Relative: 10 %
Neutro Abs: 2.1 10*3/uL (ref 1.7–7.7)
Neutrophils Relative %: 49 %
Platelets: 320 10*3/uL (ref 150–400)
RBC: 4.7 MIL/uL (ref 4.22–5.81)
RDW: 14.2 % (ref 11.5–15.5)
WBC: 4.2 10*3/uL (ref 4.0–10.5)
nRBC: 0 % (ref 0.0–0.2)

## 2021-07-08 LAB — COMPREHENSIVE METABOLIC PANEL
ALT: 19 U/L (ref 0–44)
AST: 16 U/L (ref 15–41)
Albumin: 3.3 g/dL — ABNORMAL LOW (ref 3.5–5.0)
Alkaline Phosphatase: 113 U/L (ref 38–126)
Anion gap: 7 (ref 5–15)
BUN: 22 mg/dL — ABNORMAL HIGH (ref 6–20)
CO2: 20 mmol/L — ABNORMAL LOW (ref 22–32)
Calcium: 9.5 mg/dL (ref 8.9–10.3)
Chloride: 108 mmol/L (ref 98–111)
Creatinine, Ser: 1.09 mg/dL (ref 0.61–1.24)
GFR, Estimated: >60 mL/min (ref >60)
Glucose, Bld: 257 mg/dL — ABNORMAL HIGH (ref 70–99)
Potassium: 5.4 mmol/L — ABNORMAL HIGH (ref 3.5–5.1)
Sodium: 135 mmol/L (ref 135–145)
Total Bilirubin: 0.3 mg/dL (ref 0.3–1.2)
Total Protein: 8.7 g/dL — ABNORMAL HIGH (ref 6.5–8.1)

## 2021-07-08 LAB — SEDIMENTATION RATE: Sed Rate: 39 mm/hr — ABNORMAL HIGH (ref 0–20)

## 2021-07-08 MED ORDER — HEPARIN SOD (PORK) LOCK FLUSH 100 UNIT/ML IV SOLN
250.0000 [IU] | INTRAVENOUS | Status: AC | PRN
  Administered 2021-07-08: 250 [IU]

## 2021-07-08 MED ORDER — HEPARIN SOD (PORK) LOCK FLUSH 100 UNIT/ML IV SOLN
INTRAVENOUS | Status: AC
  Filled 2021-07-08: qty 5

## 2021-07-08 NOTE — Telephone Encounter (Signed)
Patient informed of all appointment information to cath removed. Patient verbalized understanding

## 2021-07-08 NOTE — Progress Notes (Signed)
°  Labs drawn: per order  Flushed with: normal saline Caps changed? yes Recapped? yes  Temp:  Temp Readings from Last 1 Encounters:  07/08/21 98.6 F (37 C) (Temporal)   Patient tolerated well, dressing changed per protocol.

## 2021-07-09 NOTE — Progress Notes (Signed)
Thank you! Adding your patients to our spreadsheet this morning.

## 2021-07-10 ENCOUNTER — Telehealth: Payer: Self-pay | Admitting: Nurse Practitioner

## 2021-07-10 NOTE — Telephone Encounter (Signed)
Called patient to inquire about his disability forms, no answer to home phone and left message to return call and mobile number is not working.

## 2021-07-12 ENCOUNTER — Ambulatory Visit
Admission: RE | Admit: 2021-07-12 | Discharge: 2021-07-12 | Disposition: A | Discharge status: Home / Self Care | Payer: BC Managed Care – PPO | Source: Ambulatory Visit | Attending: Infectious Diseases | Admitting: Infectious Diseases

## 2021-07-12 DIAGNOSIS — Z452 Encounter for adjustment and management of vascular access device: Secondary | ICD-10-CM | POA: Insufficient documentation

## 2021-07-12 DIAGNOSIS — T148XXA Other injury of unspecified body region, initial encounter: Secondary | ICD-10-CM | POA: Insufficient documentation

## 2021-07-12 DIAGNOSIS — X58XXXA Exposure to other specified factors, initial encounter: Secondary | ICD-10-CM | POA: Insufficient documentation

## 2021-07-12 DIAGNOSIS — L089 Local infection of the skin and subcutaneous tissue, unspecified: Secondary | ICD-10-CM | POA: Insufficient documentation

## 2021-07-12 LAB — CBC WITH DIFFERENTIAL/PLATELET
Abs Immature Granulocytes: 0.02 10*3/uL (ref 0.00–0.07)
Basophils Absolute: 0 10*3/uL (ref 0.0–0.1)
Basophils Relative: 1 %
Eosinophils Absolute: 0.2 10*3/uL (ref 0.0–0.5)
Eosinophils Relative: 5 %
HCT: 38.7 % — ABNORMAL LOW (ref 39.0–52.0)
Hemoglobin: 11.5 g/dL — ABNORMAL LOW (ref 13.0–17.0)
Immature Granulocytes: 1 %
Lymphocytes Relative: 37 %
Lymphs Abs: 1.4 10*3/uL (ref 0.7–4.0)
MCH: 24.7 pg — ABNORMAL LOW (ref 26.0–34.0)
MCHC: 29.7 g/dL — ABNORMAL LOW (ref 30.0–36.0)
MCV: 83.2 fL (ref 80.0–100.0)
Monocytes Absolute: 0.4 10*3/uL (ref 0.1–1.0)
Monocytes Relative: 10 %
Neutro Abs: 1.8 10*3/uL (ref 1.7–7.7)
Neutrophils Relative %: 46 %
Platelets: 237 10*3/uL (ref 150–400)
RBC: 4.65 MIL/uL (ref 4.22–5.81)
RDW: 14.7 % (ref 11.5–15.5)
WBC: 3.9 10*3/uL — ABNORMAL LOW (ref 4.0–10.5)
nRBC: 0 % (ref 0.0–0.2)

## 2021-07-12 LAB — COMPREHENSIVE METABOLIC PANEL
ALT: 19 U/L (ref 0–44)
AST: 16 U/L (ref 15–41)
Albumin: 3.2 g/dL — ABNORMAL LOW (ref 3.5–5.0)
Alkaline Phosphatase: 109 U/L (ref 38–126)
Anion gap: 4 — ABNORMAL LOW (ref 5–15)
BUN: 28 mg/dL — ABNORMAL HIGH (ref 6–20)
CO2: 23 mmol/L (ref 22–32)
Calcium: 9.3 mg/dL (ref 8.9–10.3)
Chloride: 106 mmol/L (ref 98–111)
Creatinine, Ser: 1.18 mg/dL (ref 0.61–1.24)
GFR, Estimated: >60 mL/min (ref >60)
Glucose, Bld: 347 mg/dL — ABNORMAL HIGH (ref 70–99)
Potassium: 5.2 mmol/L — ABNORMAL HIGH (ref 3.5–5.1)
Sodium: 133 mmol/L — ABNORMAL LOW (ref 135–145)
Total Bilirubin: 0.4 mg/dL (ref 0.3–1.2)
Total Protein: 8.1 g/dL (ref 6.5–8.1)

## 2021-07-12 LAB — SEDIMENTATION RATE: Sed Rate: 47 mm/hr — ABNORMAL HIGH (ref 0–20)

## 2021-07-12 MED ORDER — HEPARIN SOD (PORK) LOCK FLUSH 100 UNIT/ML IV SOLN: 250.0000 [IU] | Freq: Once | INTRAVENOUS | Status: AC

## 2021-07-12 MED ORDER — HEPARIN SOD (PORK) LOCK FLUSH 100 UNIT/ML IV SOLN
INTRAVENOUS | Status: AC
Start: 2021-07-12 — End: 2021-07-12
  Administered 2021-07-12: 250 [IU] via INTRAVENOUS
  Filled 2021-07-12: qty 5

## 2021-07-12 MED ORDER — SODIUM CHLORIDE 0.9% FLUSH: 10.0000 mL | INTRAVENOUS | Status: DC | PRN

## 2021-07-12 MED ORDER — SODIUM CHLORIDE FLUSH 0.9 % IV SOLN
INTRAVENOUS | Status: AC
  Administered 2021-07-12: 10 mL
  Filled 2021-07-12: qty 20

## 2021-07-12 NOTE — Progress Notes (Signed)
Patient arrived for a lab draw and central line dressing change.  BP elevated, but patient said it was normal because he had not taken his medications today.  Labs were drawn without difficulty and line flushed.  Dressing was changed without difficulty.  Patient tolerated whole procedure well.  Ambulated to the door on his own.

## 2021-07-18 ENCOUNTER — Ambulatory Visit: Payer: BC Managed Care – PPO | Admitting: Nurse Practitioner

## 2021-07-18 ENCOUNTER — Ambulatory Visit: Payer: Self-pay | Attending: Infectious Diseases | Admitting: Infectious Diseases

## 2021-07-18 ENCOUNTER — Other Ambulatory Visit: Payer: Self-pay

## 2021-07-18 DIAGNOSIS — B951 Streptococcus, group B, as the cause of diseases classified elsewhere: Secondary | ICD-10-CM | POA: Insufficient documentation

## 2021-07-18 DIAGNOSIS — I10 Essential (primary) hypertension: Secondary | ICD-10-CM | POA: Insufficient documentation

## 2021-07-18 DIAGNOSIS — E119 Type 2 diabetes mellitus without complications: Secondary | ICD-10-CM | POA: Insufficient documentation

## 2021-07-18 DIAGNOSIS — M00221 Other streptococcal arthritis, right elbow: Secondary | ICD-10-CM | POA: Insufficient documentation

## 2021-07-18 DIAGNOSIS — Z792 Long term (current) use of antibiotics: Secondary | ICD-10-CM | POA: Insufficient documentation

## 2021-07-18 DIAGNOSIS — Z940 Kidney transplant status: Secondary | ICD-10-CM | POA: Insufficient documentation

## 2021-07-18 NOTE — Progress Notes (Signed)
The purpose of this virtual visit is to provide medical care while limiting exposure to the novel coronavirus (COVID19) for both patient and office staff.   Consent was obtained for phone visit:  Yes.   Answered questions that patient had about telehealth interaction:  Yes.   I discussed the limitations, risks, security and privacy concerns of performing an evaluation and management service by telephone. I also discussed with the patient that there may be a patient responsible charge related to this service. The patient expressed understanding and agreed to proceed.   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 He has done well Initially he took twice the dose of ceftriaxone ( BID) instead of QD and the home infusion company nurse identified the problem early on and and was corrected He says the rt elbow is doing much better eventhough not back to baseline- saw ortho and had stitches removed last Friday No fever, diarrhea, pruritus, rash, chills, cough, sob, pain abdomen [facility] Impression/recommendation GBS rt elbow septic arthritis Completes 4 weeks of ceftriaxone today Tunneled cath will be removed tomorrow Follow up with ortho Total time spent on this call 15 min

## 2021-07-19 ENCOUNTER — Other Ambulatory Visit: Payer: Self-pay

## 2021-07-19 ENCOUNTER — Ambulatory Visit
Admission: RE | Admit: 2021-07-19 | Discharge: 2021-07-19 | Disposition: A | Discharge status: Home / Self Care | Payer: Self-pay | Source: Ambulatory Visit | Attending: Infectious Diseases | Admitting: Infectious Diseases

## 2021-07-19 DIAGNOSIS — M00222 Other streptococcal arthritis, left elbow: Secondary | ICD-10-CM | POA: Insufficient documentation

## 2021-07-19 HISTORY — PX: IR REMOVAL TUN CV CATH W/O FL: IMG2289

## 2021-07-19 MED ORDER — LIDOCAINE HCL 1 % IJ SOLN
INTRAMUSCULAR | Status: AC
  Filled 2021-07-19: qty 20

## 2021-07-19 NOTE — Procedures (Signed)
Tunneled central venous catheter removed per order. Please see full dictation under imaging tab in Epic.  Raymond [name], [place] [phone] 07/19/2021, 9:50 AM

## 2021-07-25 ENCOUNTER — Other Ambulatory Visit: Payer: Self-pay

## 2021-07-25 ENCOUNTER — Encounter: Payer: Self-pay | Admitting: Cardiovascular Disease

## 2021-07-25 ENCOUNTER — Ambulatory Visit (INDEPENDENT_AMBULATORY_CARE_PROVIDER_SITE_OTHER): Payer: Self-pay | Admitting: Cardiovascular Disease

## 2021-07-25 VITALS — BP 108/82 | HR 65 | Ht 67.0 in | Wt 223.4 lb

## 2021-07-25 DIAGNOSIS — I10 Essential (primary) hypertension: Secondary | ICD-10-CM

## 2021-07-25 DIAGNOSIS — I70229 Atherosclerosis of native arteries of extremities with rest pain, unspecified extremity: Secondary | ICD-10-CM

## 2021-07-25 DIAGNOSIS — E782 Mixed hyperlipidemia: Secondary | ICD-10-CM

## 2021-07-25 NOTE — Progress Notes (Signed)
? ? ? ?07/25/2021 ?Raymond [name]   ?17-Dec-1960  ?[id number] ? ?Primary Physician [name], [place] ?Primary Cardiologist: Lorretta Harp MD [name], [place] ? ?HPI:  Raymond [name] is a 61 y.o.  moderately overweight separated African-American male father of 2 children, grandfather of 2 grandchildren who I last saw in the office 04/03/2021.  He was was referred by Dr. Jacqualyn Posey, his podiatrist for peripheral vascular valuation because of a small ulcer on the plantar surface of his right first metatarsal.  He has a history of treated hypertension, diabetes and hyperlipidemia.  His father had an MI at age 3 and his brother recently had a myocardial infarction as well.  He is never had a heart attack or stroke.  He denies chest pain or shortness of breath.  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  He apparently stepped on a nail approximately 2 to 3 months ago and developed a wound on the plantar surface of his right first metatarsal which Dr. Jacqualyn Posey has been treating fairly frequently over the last several months.  Apparently this is slowly healing.  He had Dopplers performed in [place] recently that were told showed normal circulation.  Since I saw him 6 months ago the wound on his right foot is healed.  He is developed a new wound on his left foot with recent Dopplers that showed a left TBI 0.28, monophasic waveforms in his left SFA with tibial vessel disease, occluded anterior tibial and peroneal artery.  I suspect he will need a amputation of that toe since he has documented osteomyelitis according to Dr. Jacqualyn Posey.  Is unclear whether this will heal but I suspect based on his Dopplers that this will be difficult.  I am also concerned that angiography will lead to radiocontrast nephropathy and jeopardize his transplanted kidney. ?  ?He saw Dr. Jacqualyn Posey  who felt that his wound was unlikely  to heal and that he would require amputation.  Dr. Jacqualyn Posey spoke to the patient's nephrologist echoing my concern about exposure to radiocontrast in the setting of moderate renal insufficiency status post renal artery transplant and apparently the nephrologist felt comfortable proceeding with peripheral angiography potential intervention. ?  ?Since I saw him 4 months ago the wound on his right foot continues to heal.  He has been released from the wound care center.  He did develop an infection in his left elbow that required debridement and 2 weeks of intravenous antibiotics.  His PICC line which was removed.  Given the fact that his wound is healing I do not think he requires any peripheral vascular intervention at this time. ? ? ?Current Meds  ?Medication Sig  ? blood glucose meter kit and supplies KIT Dispense based on patient and insurance preference. Use up to four times daily as directed. (FOR ICD-9 250.00, 250.01).  ? Blood Glucose Monitoring Suppl (ACCU-CHEK GUIDE ME) w/Device KIT 1 Piece by Does not apply route as directed.  ? cloNIDine (CATAPRES - DOSED IN MG/24 HR) 0.3 mg/24hr patch Place 0.3 mg onto the skin once a week.  ? colchicine 0.6 MG tablet Take 0.6 mg by mouth daily.  ? doxazosin (CARDURA) 4 MG tablet Take 6 mg by mouth daily.  ? Dulaglutide (TRULICITY) 7.82 NF/6.2ZH SOPN Inject 0.75 mg into the skin once a week.  ? glucose blood (ACCU-CHEK GUIDE) test strip Use as instructed 4 x daily. E11.65  ?  hydrALAZINE (APRESOLINE) 25 MG tablet Take 25 mg by mouth 2 (two) times daily.  ? Insulin NPH, Human,, Isophane, (NOVOLIN N FLEXPEN) 100 UNIT/ML Kiwkpen Inject 50 Units into the skin every morning.  ? Multiple Vitamin (MULTIVITAMIN) capsule Take 1 capsule by mouth daily.  ? sodium bicarbonate 650 MG tablet Take 1,300 mg by mouth 3 (three) times daily.  ? tacrolimus (PROGRAF) 1 MG capsule Take 3 mg by mouth 2 (two) times daily. Take 3 capsules (67m) in AM & take 3 caps (394m in PM  ? triamcinolone  (KENALOG) 0.025 % ointment Apply 1 application topically 3 (three) times daily.  ? Vitamin D, Ergocalciferol, (DRISDOL) 50000 units CAPS capsule Take 50,000 Units by mouth every 30 (thirty) days.  ?  ? ?No Known Allergies ? ?Social History  ? ?Socioeconomic History  ? Marital status: Married  ?  Spouse name: Not on file  ? Number of children: Not on file  ? Years of education: Not on file  ? Highest education level: Not on file  ?Occupational History  ? Not on file  ?Tobacco Use  ? Smoking status: Never  ? Smokeless tobacco: Never  ?Vaping Use  ? Vaping Use: Never used  ?Substance and Sexual Activity  ? Alcohol use: No  ?  Alcohol/week: 0.0 standard drinks  ? Drug use: No  ? Sexual activity: Not on file  ?Other Topics Concern  ? Not on file  ?Social History Narrative  ? ** Merged History Encounter **  ?    ? ?Social Determinants of Health  ? ?Financial Resource Strain: Not on file  ?Food Insecurity: Not on file  ?Transportation Needs: Not on file  ?Physical Activity: Not on file  ?Stress: Not on file  ?Social Connections: Not on file  ?Intimate Partner Violence: Not on file  ?  ? ?Review of Systems: ?General: negative for chills, fever, night sweats or weight changes.  ?Cardiovascular: negative for chest pain, dyspnea on exertion, edema, orthopnea, palpitations, paroxysmal nocturnal dyspnea or shortness of breath ?Dermatological: negative for rash ?Respiratory: negative for cough or wheezing ?Urologic: negative for hematuria ?Abdominal: negative for nausea, vomiting, diarrhea, bright red blood per rectum, melena, or hematemesis ?Neurologic: negative for visual changes, syncope, or dizziness ?All other systems reviewed and are otherwise negative except as noted above. ? ? ? ?Blood pressure 108/82, pulse 65, height _0  (1.702 m), weight 223 lb 6.4 oz (101.3 kg), SpO2 98 %.  ?General appearance: alert and no distress ?Neck: no adenopathy, no carotid bruit, no JVD, supple, symmetrical, trachea midline, and thyroid not  enlarged, symmetric, no tenderness/mass/nodules ?Lungs: clear to auscultation bilaterally ?Heart: regular rate and rhythm, S1, S2 normal, no murmur, click, rub or gallop ?Extremities: extremities normal, atraumatic, no cyanosis or edema ?Pulses: 2+ and symmetric ?Skin: Healing wound right foot ?Neurologic: Grossly normal ? ?EKG sinus rhythm at 65 without ST or T wave changes.  Personally reviewed this EKG. ? ?ASSESSMENT AND PLAN:  ? ?Essential hypertension, benign ?History of essential hypertension blood pressure measured today at 108/82.  He is on Catapres patch, hydralazine. ? ?Mixed hyperlipidemia ?History of hyperlipidemia on atorvastatin with lipid profile performed 12/11/2020 revealing total cholesterol 148, LDL of 87 and HDL 39. ? ?Critical lower limb ischemia ?History of critical limb ischemia with prior wound on the left foot which has since healed and a current wound on the right foot which was being treated by Dr. WaJacqualyn Poseynd the wound care center.  This is slowly healing.  He does have tibial  disease on the right.  He status post renal transplant with normal renal function.  At this point, given the slow progression to healing I do not think he requires any invasive procedure.  I will see him back in 1 year for follow-up. ? ? ? ? ?Lorretta Harp MD FACP,FACC,FAHA, FSCAI ?07/25/2021 ?8:32 AM ?

## 2021-07-25 NOTE — Assessment & Plan Note (Signed)
History of hyperlipidemia on atorvastatin with lipid profile performed 12/11/2020 revealing total cholesterol 148, LDL of 87 and HDL 39. ?

## 2021-07-25 NOTE — Assessment & Plan Note (Signed)
History of critical limb ischemia with prior wound on the left foot which has since healed and a current wound on the right foot which was being treated by Dr. Jacqualyn Posey and the wound care center.  This is slowly healing.  He does have tibial disease on the right.  He status post renal transplant with normal renal function.  At this point, given the slow progression to healing I do not think he requires any invasive procedure.  I will see him back in 1 year for follow-up. ?

## 2021-07-25 NOTE — Patient Instructions (Signed)

## 2021-07-25 NOTE — Assessment & Plan Note (Signed)
History of essential hypertension blood pressure measured today at 108/82.  He is on Catapres patch, hydralazine. ?

## 2021-08-09 ENCOUNTER — Ambulatory Visit: Payer: BC Managed Care – PPO | Admitting: Cardiovascular Disease

## 2021-08-14 ENCOUNTER — Other Ambulatory Visit: Payer: Self-pay

## 2021-08-14 ENCOUNTER — Encounter (HOSPITAL_COMMUNITY): Payer: Self-pay | Admitting: Emergency Medicine

## 2021-08-14 ENCOUNTER — Emergency Department (HOSPITAL_COMMUNITY): Payer: Self-pay

## 2021-08-14 ENCOUNTER — Inpatient Hospital Stay (HOSPITAL_COMMUNITY)
Admission: EM | Admit: 2021-08-14 | Discharge: 2021-08-18 | DRG: 982 | Disposition: A | Discharge status: Home / Self Care | Payer: Self-pay | Attending: Internal Medicine | Admitting: Internal Medicine

## 2021-08-14 DIAGNOSIS — E11621 Type 2 diabetes mellitus with foot ulcer: Principal | ICD-10-CM | POA: Diagnosis present

## 2021-08-14 DIAGNOSIS — Z960 Presence of urogenital implants: Secondary | ICD-10-CM | POA: Diagnosis present

## 2021-08-14 DIAGNOSIS — L97519 Non-pressure chronic ulcer of other part of right foot with unspecified severity: Secondary | ICD-10-CM | POA: Diagnosis present

## 2021-08-14 DIAGNOSIS — E782 Mixed hyperlipidemia: Secondary | ICD-10-CM | POA: Diagnosis present

## 2021-08-14 DIAGNOSIS — Z7985 Long-term (current) use of injectable non-insulin antidiabetic drugs: Secondary | ICD-10-CM

## 2021-08-14 DIAGNOSIS — Z794 Long term (current) use of insulin: Secondary | ICD-10-CM

## 2021-08-14 DIAGNOSIS — H900 Conductive hearing loss, bilateral: Secondary | ICD-10-CM | POA: Diagnosis present

## 2021-08-14 DIAGNOSIS — L02611 Cutaneous abscess of right foot: Secondary | ICD-10-CM | POA: Diagnosis present

## 2021-08-14 DIAGNOSIS — E119 Type 2 diabetes mellitus without complications: Secondary | ICD-10-CM

## 2021-08-14 DIAGNOSIS — E669 Obesity, unspecified: Secondary | ICD-10-CM | POA: Diagnosis present

## 2021-08-14 DIAGNOSIS — L97415 Non-pressure chronic ulcer of right heel and midfoot with muscle involvement without evidence of necrosis: Secondary | ICD-10-CM

## 2021-08-14 DIAGNOSIS — Z6834 Body mass index (BMI) 34.0-34.9, adult: Secondary | ICD-10-CM

## 2021-08-14 DIAGNOSIS — Z79899 Other long term (current) drug therapy: Secondary | ICD-10-CM

## 2021-08-14 DIAGNOSIS — B954 Other streptococcus as the cause of diseases classified elsewhere: Secondary | ICD-10-CM | POA: Diagnosis present

## 2021-08-14 DIAGNOSIS — Z833 Family history of diabetes mellitus: Secondary | ICD-10-CM

## 2021-08-14 DIAGNOSIS — E1122 Type 2 diabetes mellitus with diabetic chronic kidney disease: Secondary | ICD-10-CM

## 2021-08-14 DIAGNOSIS — N183 Chronic kidney disease, stage 3 unspecified: Secondary | ICD-10-CM

## 2021-08-14 DIAGNOSIS — N1831 Chronic kidney disease, stage 3a: Secondary | ICD-10-CM | POA: Diagnosis present

## 2021-08-14 DIAGNOSIS — Z940 Kidney transplant status: Secondary | ICD-10-CM

## 2021-08-14 DIAGNOSIS — I10 Essential (primary) hypertension: Secondary | ICD-10-CM | POA: Diagnosis present

## 2021-08-14 DIAGNOSIS — L03115 Cellulitis of right lower limb: Principal | ICD-10-CM

## 2021-08-14 DIAGNOSIS — Z8614 Personal history of Methicillin resistant Staphylococcus aureus infection: Secondary | ICD-10-CM

## 2021-08-14 DIAGNOSIS — I129 Hypertensive chronic kidney disease with stage 1 through stage 4 chronic kidney disease, or unspecified chronic kidney disease: Secondary | ICD-10-CM | POA: Diagnosis present

## 2021-08-14 LAB — CBC WITH DIFFERENTIAL/PLATELET
Abs Immature Granulocytes: 0.19 10*3/uL — ABNORMAL HIGH (ref 0.00–0.07)
Basophils Absolute: 0 10*3/uL (ref 0.0–0.1)
Basophils Relative: 0 %
Eosinophils Absolute: 0.1 10*3/uL (ref 0.0–0.5)
Eosinophils Relative: 0 %
HCT: 38.9 % — ABNORMAL LOW (ref 39.0–52.0)
Hemoglobin: 12 g/dL — ABNORMAL LOW (ref 13.0–17.0)
Immature Granulocytes: 1 %
Lymphocytes Relative: 12 %
Lymphs Abs: 1.7 10*3/uL (ref 0.7–4.0)
MCH: 25.1 pg — ABNORMAL LOW (ref 26.0–34.0)
MCHC: 30.8 g/dL (ref 30.0–36.0)
MCV: 81.2 fL (ref 80.0–100.0)
Monocytes Absolute: 1.1 10*3/uL — ABNORMAL HIGH (ref 0.1–1.0)
Monocytes Relative: 8 %
Neutro Abs: 11.3 10*3/uL — ABNORMAL HIGH (ref 1.7–7.7)
Neutrophils Relative %: 79 %
Platelets: 244 10*3/uL (ref 150–400)
RBC: 4.79 MIL/uL (ref 4.22–5.81)
RDW: 14 % (ref 11.5–15.5)
WBC: 14.4 10*3/uL — ABNORMAL HIGH (ref 4.0–10.5)
nRBC: 0 % (ref 0.0–0.2)

## 2021-08-14 LAB — GLUCOSE, CAPILLARY
Glucose-Capillary: 92 mg/dL (ref 70–99)
Glucose-Capillary: 78 mg/dL (ref 70–99)
Glucose-Capillary: 80 mg/dL (ref 70–99)

## 2021-08-14 LAB — BASIC METABOLIC PANEL
Anion gap: 6 (ref 5–15)
BUN: 32 mg/dL — ABNORMAL HIGH (ref 6–20)
CO2: 25 mmol/L (ref 22–32)
Calcium: 9.5 mg/dL (ref 8.9–10.3)
Chloride: 104 mmol/L (ref 98–111)
Creatinine, Ser: 1.49 mg/dL — ABNORMAL HIGH (ref 0.61–1.24)
GFR, Estimated: 53 mL/min — ABNORMAL LOW (ref >60)
Glucose, Bld: 85 mg/dL (ref 70–99)
Potassium: 4.4 mmol/L (ref 3.5–5.1)
Sodium: 135 mmol/L (ref 135–145)

## 2021-08-14 LAB — HEMOGLOBIN A1C
Hgb A1c MFr Bld: 10.4 % — ABNORMAL HIGH (ref 4.8–5.6)
Mean Plasma Glucose: 251.78 mg/dL

## 2021-08-14 LAB — SEDIMENTATION RATE: Sed Rate: 88 mm/hr — ABNORMAL HIGH (ref 0–16)

## 2021-08-14 LAB — C-REACTIVE PROTEIN: CRP: 25.2 mg/dL — ABNORMAL HIGH (ref <1.0)

## 2021-08-14 LAB — PREALBUMIN: Prealbumin: <5 mg/dL — ABNORMAL LOW (ref 18–38)

## 2021-08-14 MED ORDER — SODIUM CHLORIDE 0.9 % IV SOLN
2.0000 g | INTRAVENOUS | Status: DC
  Administered 2021-08-14 – 2021-08-15 (×2): 2 g via INTRAVENOUS
  Filled 2021-08-14 (×2): qty 20

## 2021-08-14 MED ORDER — ACETAMINOPHEN 650 MG RE SUPP
650.0000 mg | Freq: Four times a day (QID) | RECTAL | Status: DC | PRN
Start: 2021-08-14 — End: 2021-08-18

## 2021-08-14 MED ORDER — HEPARIN SODIUM (PORCINE) 5000 UNIT/ML IJ SOLN
5000.0000 [IU] | Freq: Three times a day (TID) | INTRAMUSCULAR | Status: DC
  Administered 2021-08-16 – 2021-08-18 (×7): 5000 [IU] via SUBCUTANEOUS
  Filled 2021-08-14 (×8): qty 1

## 2021-08-14 MED ORDER — SODIUM CHLORIDE 0.9% FLUSH
3.0000 mL | Freq: Two times a day (BID) | INTRAVENOUS | Status: DC
  Administered 2021-08-15 – 2021-08-18 (×3): 3 mL via INTRAVENOUS

## 2021-08-14 MED ORDER — SODIUM CHLORIDE 0.9 % IV SOLN: INTRAVENOUS | Status: DC

## 2021-08-14 MED ORDER — CLONIDINE HCL 0.3 MG/24HR TD PTWK
0.3000 mg | MEDICATED_PATCH | TRANSDERMAL | Status: DC
  Administered 2021-08-18: 0.3 mg via TRANSDERMAL
  Filled 2021-08-14: qty 1

## 2021-08-14 MED ORDER — HYDRALAZINE HCL 25 MG PO TABS
25.0000 mg | ORAL_TABLET | Freq: Two times a day (BID) | ORAL | Status: DC
  Administered 2021-08-14 – 2021-08-18 (×8): 25 mg via ORAL
  Filled 2021-08-14 (×8): qty 1

## 2021-08-14 MED ORDER — METRONIDAZOLE 500 MG/100ML IV SOLN
500.0000 mg | Freq: Two times a day (BID) | INTRAVENOUS | Status: DC
  Administered 2021-08-14 – 2021-08-16 (×4): 500 mg via INTRAVENOUS
  Filled 2021-08-14 (×4): qty 100

## 2021-08-14 MED ORDER — HYDROCODONE-ACETAMINOPHEN 5-325 MG PO TABS
1.0000 | ORAL_TABLET | ORAL | Status: DC | PRN
  Administered 2021-08-14 – 2021-08-18 (×6): 2 via ORAL
  Filled 2021-08-14 (×6): qty 2

## 2021-08-14 MED ORDER — PIPERACILLIN-TAZOBACTAM 3.375 G IVPB 30 MIN
3.3750 g | Freq: Once | INTRAVENOUS | Status: AC
  Administered 2021-08-14: 3.375 g via INTRAVENOUS
  Filled 2021-08-14: qty 50

## 2021-08-14 MED ORDER — INSULIN GLARGINE-YFGN 100 UNIT/ML ~~LOC~~ SOLN
15.0000 [IU] | Freq: Every day | SUBCUTANEOUS | Status: DC
  Administered 2021-08-15 – 2021-08-17 (×3): 15 [IU] via SUBCUTANEOUS
  Filled 2021-08-14 (×4): qty 0.15

## 2021-08-14 MED ORDER — INSULIN ASPART 100 UNIT/ML IJ SOLN
0.0000 [IU] | Freq: Three times a day (TID) | INTRAMUSCULAR | Status: DC
  Administered 2021-08-16: 3 [IU] via SUBCUTANEOUS
  Administered 2021-08-16 (×2): 5 [IU] via SUBCUTANEOUS
  Administered 2021-08-17: 11 [IU] via SUBCUTANEOUS
  Administered 2021-08-17: 5 [IU] via SUBCUTANEOUS
  Administered 2021-08-17: 8 [IU] via SUBCUTANEOUS
  Administered 2021-08-18: 11 [IU] via SUBCUTANEOUS
  Administered 2021-08-18: 5 [IU] via SUBCUTANEOUS

## 2021-08-14 MED ORDER — TACROLIMUS 1 MG PO CAPS
3.0000 mg | ORAL_CAPSULE | Freq: Two times a day (BID) | ORAL | Status: DC
  Administered 2021-08-14 – 2021-08-15 (×2): 3 mg via ORAL
  Filled 2021-08-14 (×2): qty 3

## 2021-08-14 MED ORDER — ATORVASTATIN CALCIUM 10 MG PO TABS
10.0000 mg | ORAL_TABLET | Freq: Every day | ORAL | Status: DC
  Administered 2021-08-15 – 2021-08-18 (×4): 10 mg via ORAL
  Filled 2021-08-14 (×4): qty 1

## 2021-08-14 MED ORDER — VANCOMYCIN HCL 2000 MG/400ML IV SOLN
2000.0000 mg | Freq: Once | INTRAVENOUS | Status: AC
  Administered 2021-08-14: 2000 mg via INTRAVENOUS
  Filled 2021-08-14: qty 400

## 2021-08-14 MED ORDER — ACETAMINOPHEN 325 MG PO TABS: 650.0000 mg | ORAL_TABLET | Freq: Four times a day (QID) | ORAL | Status: DC | PRN

## 2021-08-14 MED ORDER — DORZOLAMIDE HCL-TIMOLOL MAL 2-0.5 % OP SOLN
1.0000 [drp] | Freq: Two times a day (BID) | OPHTHALMIC | Status: DC
  Administered 2021-08-14 – 2021-08-18 (×8): 1 [drp] via OPHTHALMIC
  Filled 2021-08-14: qty 10

## 2021-08-14 MED ORDER — POLYETHYLENE GLYCOL 3350 17 G PO PACK: 17.0000 g | PACK | Freq: Every day | ORAL | Status: DC | PRN

## 2021-08-14 MED ORDER — SODIUM BICARBONATE 650 MG PO TABS
1300.0000 mg | ORAL_TABLET | Freq: Three times a day (TID) | ORAL | Status: DC
  Administered 2021-08-14 – 2021-08-18 (×10): 1300 mg via ORAL
  Filled 2021-08-14 (×12): qty 2

## 2021-08-14 MED ORDER — ONDANSETRON HCL 4 MG/2ML IJ SOLN: 4.0000 mg | Freq: Four times a day (QID) | INTRAMUSCULAR | Status: DC | PRN

## 2021-08-14 MED ORDER — HYDRALAZINE HCL 20 MG/ML IJ SOLN
5.0000 mg | INTRAMUSCULAR | Status: DC | PRN
  Administered 2021-08-17: 5 mg via INTRAVENOUS
  Filled 2021-08-14: qty 1

## 2021-08-14 MED ORDER — SODIUM CHLORIDE 0.9% FLUSH: 3.0000 mL | INTRAVENOUS | Status: DC | PRN

## 2021-08-14 MED ORDER — PIPERACILLIN-TAZOBACTAM 3.375 G IVPB 30 MIN
3.3750 g | Freq: Once | INTRAVENOUS | Status: DC
  Filled 2021-08-14: qty 50

## 2021-08-14 MED ORDER — BRIMONIDINE TARTRATE 0.2 % OP SOLN
1.0000 [drp] | Freq: Three times a day (TID) | OPHTHALMIC | Status: DC
  Administered 2021-08-14 – 2021-08-18 (×10): 1 [drp] via OPHTHALMIC
  Filled 2021-08-14: qty 5

## 2021-08-14 MED ORDER — LATANOPROST 0.005 % OP SOLN
1.0000 [drp] | Freq: Every day | OPHTHALMIC | Status: DC
  Administered 2021-08-14 – 2021-08-17 (×4): 1 [drp] via OPHTHALMIC
  Filled 2021-08-14: qty 2.5

## 2021-08-14 MED ORDER — VANCOMYCIN HCL IN DEXTROSE 1-5 GM/200ML-% IV SOLN
1000.0000 mg | INTRAVENOUS | Status: DC
Start: 2021-08-15 — End: 2021-08-16
  Filled 2021-08-14 (×3): qty 200

## 2021-08-14 MED ORDER — ONDANSETRON HCL 4 MG PO TABS: 4.0000 mg | ORAL_TABLET | Freq: Four times a day (QID) | ORAL | Status: DC | PRN

## 2021-08-14 MED ORDER — SODIUM CHLORIDE 0.9 % IV SOLN: 250.0000 mL | INTRAVENOUS | Status: DC | PRN

## 2021-08-14 MED ORDER — DOXAZOSIN MESYLATE 4 MG PO TABS
4.0000 mg | ORAL_TABLET | Freq: Every day | ORAL | Status: DC
  Administered 2021-08-14 – 2021-08-18 (×5): 4 mg via ORAL
  Filled 2021-08-14 (×5): qty 1

## 2021-08-14 NOTE — Plan of Care (Signed)
  Problem: Clinical Measurements: Goal: Ability to maintain clinical measurements within normal limits will improve Outcome: Progressing   Problem: Clinical Measurements: Goal: Will remain free from infection Outcome: Progressing   Problem: Pain Managment: Goal: General experience of comfort will improve Outcome: Progressing   

## 2021-08-14 NOTE — ED Provider Notes (Signed)
Blood pressure (!) 169/94, pulse 78, temperature 98.2 ?F (36.8 ?C), temperature source Oral, resp. rate 17, SpO2 95 %. ? ?Assuming care from Dr. Zenia Resides.  In short, Raymond [name] is a 62 y.o. male with a chief complaint of Foot Swelling and Wound Check ?[name]  Refer to the original H&P for additional details. ? ?The current plan of care is to follow up on MRI, osteo evaluation. ? ?04:00 PM  ?Independently evaluated the MRI.  Radiology does not appreciate any osteomyelitis but significant edema through the tissues as well as some fluid collections, edema versus abscess.  Patient has leukocytosis to 14.4.  At the bedside the patient's right foot is swollen, erythematous, warm to touch.  Considered trial without patient antibiotic but given the MRI findings and clinical exam, plan for abx and admit.  ? ?MRI independently evaluated. Plan for IV abx and admit.  ? ?Discussed patient's case with [facility]  to request admission. Patient and family (if present) updated with plan. Care transferred to [facility] service. ? ?I reviewed all nursing notes, vitals, pertinent [name] records, EKGs, labs, imaging (as available). ? ? ?  ?Margette Fast, MD ?08/17/21 [phone] ? ?

## 2021-08-14 NOTE — ED Notes (Addendum)
Patient to MRI.

## 2021-08-14 NOTE — Consult Note (Signed)
Podiatry Consult Note ? ?To: Dr. Dessa Phi, hospitalist ?Reason for consult: Right foot abscess with cellulitis and wound ?From: Dr. Landis Martins, podiatry ? ?HPI: ?Raymond [name] is a 61 y.o. male patient who seen at bedside for evaluation of right foot swelling and pain. Patient complains of progressive pain and swelling that has slowly worsened over the last few days patient reports that he was coming to our office for care prior to being referred to the wound center and also has seen Dr. Gwenlyn Found states that the wound has been around several months and has not healed.  Patient denies any current constitutional symptoms at this time. ? ?Patient Active Problem List  ? Diagnosis Date Noted  ? Diabetic foot ulcer ([place]) 08/14/2021  ? CKD (chronic kidney disease) stage 3, GFR 30-59 ml/min (HCC) 08/14/2021  ? Hypomagnesemia 06/25/2021  ? Septic arthritis of elbow, left ([place]) 06/24/2021  ? Peripheral vascular disease ([place]) 04/24/2020  ? Pyogenic inflammation of bone (HCC)   ? Thrombophlebitis of superficial veins of right lower extremity   ? Difficult intravenous access   ? MRSA bacteremia   ? Diabetic foot infection ([place])   ? Infected wound 01/12/2020  ? Critical lower limb ischemia ([place]) 02/02/2019  ? Personal history of noncompliance with medical treatment, presenting hazards to health 10/07/2018  ? Critical limb ischemia with history of revascularization of same extremity ([place]) 07/27/2018  ? Mixed hyperlipidemia 02/19/2018  ? History of renal transplant 02/19/2018  ? Conductive hearing loss, bilateral 08/31/2017  ? Nuclear sclerotic cataract of right eye 10/07/2016  ? Pseudophakia of left eye 10/07/2016  ? Hypertension due to endocrine disorder 03/04/2016  ? 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 Renal failure 03/04/2016  ? Proliferative diabetic retinopathy with macular edema associated with type 2 diabetes mellitus ([place])  03/04/2016  ? Immunosuppression ([place]) 12/10/2015  ? Nausea vomiting and diarrhea 12/10/2015  ? Type 2 diabetes mellitus ([place]) 12/10/2015  ? Essential hypertension, benign 12/10/2015  ? Pancytopenia ([place]) 12/10/2015  ? Encounter for screening colonoscopy 11/15/2014  ? GERD (gastroesophageal reflux disease) 11/15/2014  ? ? ?No current facility-administered medications on file prior to encounter.  ? ?Current Outpatient Medications on File Prior to Encounter  ?Medication Sig Dispense Refill  ? atorvastatin (LIPITOR) 10 MG tablet Take 10 mg by mouth daily.    ? brimonidine (ALPHAGAN) 0.2 % ophthalmic solution Place 1 drop into both eyes 3 (three) times daily.    ? cloNIDine (CATAPRES - DOSED IN MG/24 HR) 0.3 mg/24hr patch Place 0.3 mg onto the skin once a week.    ? colchicine 0.6 MG tablet Take 0.6 mg by mouth daily as needed (gout).    ? dorzolamide-timolol (COSOPT) 22.3-6.8 MG/ML ophthalmic solution Place 1 drop into both eyes 2 (two) times daily.    ? doxazosin (CARDURA) 4 MG tablet Take 4 mg by mouth daily.    ? Dulaglutide (TRULICITY) 3.15 QM/0.8QP SOPN Inject 0.75 mg into the skin once a week. 6 mL 3  ? hydrALAZINE (APRESOLINE) 25 MG tablet Take 25 mg by mouth 2 (two) times daily.    ? Insulin NPH, Human,, Isophane, (NOVOLIN N FLEXPEN) 100 UNIT/ML Kiwkpen Inject 50 Units into the skin every morning. (Patient taking differently: Inject 60 Units into the skin in the morning and at bedtime.) 105 mL 3  ? latanoprost (XALATAN) 0.005 % ophthalmic solution Place 1 drop into both eyes at bedtime.    ? Multiple  Vitamin (MULTIVITAMIN) capsule Take 1 capsule by mouth daily.    ? sodium bicarbonate 650 MG tablet Take 1,300 mg by mouth 3 (three) times daily.    ? tacrolimus (PROGRAF) 1 MG capsule Take 3 mg by mouth 2 (two) times daily. Take 3 capsules (6m) in AM & take 3 caps (364m in PM    ? Vitamin D, Ergocalciferol, (DRISDOL) 50000 units CAPS capsule Take 50,000 Units by mouth every 30 (thirty) days.    ? atorvastatin  (LIPITOR) 10 MG tablet Take 1 tablet (10 mg total) by mouth daily. 90 tablet 1  ? blood glucose meter kit and supplies KIT Dispense based on patient and insurance preference. Use up to four times daily as directed. (FOR ICD-9 250.00, 250.01). 1 each 0  ? Blood Glucose Monitoring Suppl (ACCU-CHEK GUIDE ME) w/Device KIT 1 Piece by Does not apply route as directed. 1 kit 0  ? glucose blood (ACCU-CHEK GUIDE) test strip Use as instructed 4 x daily. E11.65 150 each 5  ? ? ?No Known Allergies ? ?Past Surgical History:  ?Procedure Laterality Date  ? BONE BIOPSY Right 03/06/2021  ? Procedure: BONE BIOPSY;  Surgeon: WaTrula SladeDPM;  [facility]: [facility];  Service: Podiatry;  Laterality: Right;  ? CENTRAL VENOUS CATHETER INSERTION Right 01/20/2020  ? Procedure: INSERTION CENTRAL LINE ADULT; Tunneled central line;  Surgeon: BrVirl CageyMD;  Location: [facility];  Service: General;  Laterality: Right;  ? COLONOSCOPY N/A 12/05/2014  ? SLDPO:EUMPNTIRxternal and internal hemorrhoid/mild diverticulosis/11 polyps removed  ? ESOPHAGOGASTRODUODENOSCOPY N/A 12/05/2014  ? SLWER:XVQMuodenitis  ? GRAFT APPLICATION Right 1008/67/6195? Procedure: GRAFT APPLICATION;  Surgeon: WaTrula SladeDPM;  [facility];  Service: Podiatry;  Laterality: Right;  ? IR FLUORO GUIDE CV LINE RIGHT  03/01/2019  ? IR PERC TUN PERIT CATH WO PORT S&I /IMAG  06/27/2021  ? IR REMOVAL TUN CV CATH W/O FL  05/10/2019  ? IR REMOVAL TUN CV CATH W/O FL  02/29/2020  ? IR REMOVAL TUN CV CATH W/O FL  07/19/2021  ? IR [place] VASC ACCESS RIGHT  03/01/2019  ? IR [place]IDE VASC ACCESS RIGHT  06/27/2021  ? IRRIGATION AND DEBRIDEMENT ELBOW Left 06/24/2021  ? Procedure: IRRIGATION AND DEBRIDEMENT ELBOW;  Surgeon: BoLovell SheehanMD;  Location: [facility] ORS;  Service: Orthopedics;  Laterality: Left;  ? KIDNEY TRANSPLANT  03/27/2015  ? Penile Pump Insertion    ? TEE WITHOUT CARDIOVERSION N/A 01/17/2020  ? Procedure: TRANSESOPHAGEAL ECHOCARDIOGRAM (TEE) WITH PROPOFOL;   Surgeon: BrArnoldo LenisMD;  [facility];  Service: Endoscopy;  Laterality: N/A;  ? WOUND DEBRIDEMENT Right 03/06/2021  ? Procedure: DEBRIDEMENT WOUND;  Surgeon: WaTrula SladeDPM;  [facility];  Service: Podiatry;  Laterality: Right;  ? ? ?Family History  ?Problem Relation Age of Onset  ? Diabetes Mother   ? Heart disease Father   ? Colon cancer Neg Hx   ? ? ?Social History  ? ?Socioeconomic History  ? Marital status: Married  ?  Spouse name: Not on file  ? Number of children: Not on file  ? Years of education: Not on file  ? Highest education level: Not on file  ?Occupational History  ? Not on file  ?Tobacco Use  ? Smoking status: Never  ? Smokeless tobacco: Never  ?Vaping Use  ? Vaping Use: Never used  ?Substance and Sexual Activity  ? Alcohol use: No  ?  Alcohol/week: 0.0 standard  drinks  ? Drug use: No  ? Sexual activity: Not on file  ?Other Topics Concern  ? Not on file  ?Social History Narrative  ? ** Merged History Encounter **  ?    ? ?Social Determinants of Health  ? ?Financial Resource Strain: Not on file  ?Food Insecurity: Not on file  ?Transportation Needs: Not on file  ?Physical Activity: Not on file  ?Stress: Not on file  ?Social Connections: Not on file  ?Intimate Partner Violence: Not on file  ? ? ? ?Objective:  ?Today's Vitals  ? 08/14/21 1408 08/14/21 1749 08/14/21 1750 08/14/21 1822  ?BP:  (!) 160/83    ?Pulse: 78 76    ?Resp:  18    ?Temp:  100.3 ?F (37.9 ?C)    ?TempSrc:  Oral    ?SpO2: 95% 98%    ?Weight:    98.8 kg  ?Height:   '5\' 7"'  (1.702 m)   ?PainSc:   6    ? ?Body mass index is 34.11 kg/m?.  ? ?General: Alert and oriented x3 in no acute distress ? ?Dermatology: There is a full-thickness ulceration that measures less than 2 cm at the plantar first metatarsal head with fatty tissue exposed and surrounding keratosis with undermining.  There is clear to yellow drainage that is very minimal there is significant profound cellulitis to the level of the midfoot and  localized swelling and a circumferential fashion with warmth. ? ? ? ? ?Vascular: Dorsalis Pedis and Posterior Tibial pedal pulses are difficult to palpate on the right due to edema and cellulitis. ? ?Neurology:

## 2021-08-14 NOTE — ED Provider Notes (Signed)
?[facility] DEPT ?Provider Note ? ? ?CSN: [id number] ?Arrival date & time: 08/14/21  0940 ? ?  ? ?History ? ?Chief Complaint  ?Patient presents with  ? Foot Swelling  ? Wound Check  ? ? ?Raymond [name] is a 61 y.o. male. ? ?61 year old male presents with diabetic ulcer to sole of right foot.  Denies any fever.  No purulent drainage.  States that he went to the wound care center today to be seen but they can only see him next week.  No recent history of trauma.  Also notes edema to his right foot. ? ? ?  ? ?Home Medications ?Prior to Admission medications   ?Medication Sig Start Date End Date Taking? Authorizing Provider  ?atorvastatin (LIPITOR) 10 MG tablet Take 1 tablet (10 mg total) by mouth daily. 03/14/21 06/12/21  [name], FNP  ?blood glucose meter kit and supplies KIT Dispense based on patient and insurance preference. Use up to four times daily as directed. (FOR ICD-9 250.00, 250.01). 04/11/20   [name], MD  ?Blood Glucose Monitoring Suppl (ACCU-CHEK GUIDE ME) w/Device KIT 1 Piece by Does not apply route as directed. 02/18/18   [name], MD  ?cloNIDine (CATAPRES - DOSED IN MG/24 HR) 0.3 mg/24hr patch Place 0.3 mg onto the skin once a week.    [provider]  ?colchicine 0.6 MG tablet Take 0.6 mg by mouth daily. 06/17/21   [provider]  ?Continuous Blood Gluc Receiver (FREESTYLE LIBRE 2 READER) DEVI As directed ?Patient not taking: Reported on 07/25/2021 04/24/20   [name], MD  ?Continuous Blood Gluc Sensor (FREESTYLE LIBRE 2 SENSOR) MISC 1 Piece by Does not apply route every 14 (fourteen) days. ?Patient not taking: Reported on 07/25/2021 04/24/20   [name], MD  ?doxazosin (CARDURA) 4 MG tablet Take 6 mg by mouth daily. 12/18/15   [provider]  ?Dulaglutide (TRULICITY) 4.03 FV/4.3KG SOPN Inject 0.75 mg into the skin once a week. 03/25/21   Renato Shin, MD  ?glucose blood (ACCU-CHEK GUIDE) test  strip Use as instructed 4 x daily. E11.65 02/19/18   [name], MD  ?hydrALAZINE (APRESOLINE) 25 MG tablet Take 25 mg by mouth 2 (two) times daily.    [provider]  ?HYDROcodone-acetaminophen (NORCO/VICODIN) 5-325 MG tablet Take 1 tablet by mouth every 6 (six) hours as needed. ?Patient not taking: Reported on 07/25/2021 03/06/21   Trula Slade, DPM  ?Insulin NPH, Human,, Isophane, (NOVOLIN N FLEXPEN) 100 UNIT/ML Kiwkpen Inject 50 Units into the skin every morning. 06/27/21   Terrilee Croak, MD  ?Multiple Vitamin (MULTIVITAMIN) capsule Take 1 capsule by mouth daily.    [provider]  ?silver sulfADIAZINE (SILVADENE) 1 % cream Apply 1 application topically daily. ?Patient not taking: Reported on 07/25/2021 01/31/21   Trula Slade, DPM  ?sodium bicarbonate 650 MG tablet Take 1,300 mg by mouth 3 (three) times daily. 11/02/18   [provider]  ?tacrolimus (PROGRAF) 1 MG capsule Take 3 mg by mouth 2 (two) times daily. Take 3 capsules (74m) in AM & take 3 caps (330m in PM    [provider]  ?triamcinolone (KENALOG) 0.025 % ointment Apply 1 application topically 3 (three) times daily. 01/07/20   [provider]  ?Vitamin D, Ergocalciferol, (DRISDOL) 50000 units CAPS capsule Take 50,000 Units by mouth every 30 (thirty) days.    [provider]  ?   ? ?Allergies    ?Patient has no known  allergies.   ? ?Review of Systems   ?Review of Systems  ?All other systems reviewed and are negative. ? ?Physical Exam ?Updated Vital Signs ?BP (!) 144/80 (BP Location: Right Arm)   Pulse 82   Temp 98.2 ?F (36.8 ?C) (Oral)   Resp 20   SpO2 98%  ?Physical Exam ?Vitals and nursing note reviewed.  ?Constitutional:   ?   General: He is not in acute distress. ?   Appearance: Normal appearance. He is well-developed. He is not toxic-appearing.  ?HENT:  ?   Head: Normocephalic and atraumatic.  ?Eyes:  ?   General: Lids are normal.  ?   Conjunctiva/sclera: Conjunctivae normal.   ?   Pupils: Pupils are equal, round, and reactive to light.  ?Neck:  ?   Thyroid: No thyroid mass.  ?   Trachea: No tracheal deviation.  ?Cardiovascular:  ?   Rate and Rhythm: Normal rate and regular rhythm.  ?   Heart sounds: Normal heart sounds. No murmur heard. ?  No gallop.  ?Pulmonary:  ?   Effort: Pulmonary effort is normal. No respiratory distress.  ?   Breath sounds: Normal breath sounds. No stridor. No decreased breath sounds, wheezing, rhonchi or rales.  ?Abdominal:  ?   General: There is no distension.  ?   Palpations: Abdomen is soft.  ?   Tenderness: There is no abdominal tenderness. There is no rebound.  ?Musculoskeletal:     ?   General: No tenderness. Normal range of motion.  ?   Cervical back: Normal range of motion and neck supple.  ?   Comments: Diabetic ulcer at base of right great toe without evidence of infection or purulence.  Slight edema noted to right foot without erythema or tenderness to the touch  ?Skin: ?   General: Skin is warm and dry.  ?   Findings: No abrasion or rash.  ?Neurological:  ?   Mental Status: He is alert and oriented to person, place, and time. Mental status is at baseline.  ?   GCS: GCS eye subscore is 4. GCS verbal subscore is 5. GCS motor subscore is 6.  ?   Cranial Nerves: No cranial nerve deficit.  ?   Sensory: No sensory deficit.  ?   Motor: Motor function is intact.  ?Psychiatric:     ?   Attention and Perception: Attention normal.     ?   Speech: Speech normal.     ?   Behavior: Behavior normal.  ? ? ?ED Results / Procedures / Treatments   ?Labs ?(all labs ordered are listed, but only abnormal results are displayed) ?Labs Reviewed - No data to display ? ?EKG ?None ? ?Radiology ?No results found. ? ?Procedures ?Procedures  ? ? ?Medications Ordered in ED ?Medications - No data to display ? ?ED Course/ Medical Decision Making/ A&P ?  ?                        ?Medical Decision Making ?Amount and/or Complexity of Data Reviewed ?Labs: ordered. ?Radiology:  ordered. ? ?Risk ?Prescription drug management. ?Decision regarding hospitalization. ? ? ?Patient presented with right foot pain.  Concern for possible infection and plain x-rays performed which did not show any gas.  Due to concern for possible osteomyelitis patient had MRI of his foot ordered which is pending at this time.  He does not appear to be septic at this time.  Could have cellulitis.  Care has been  signed to Dr. Laverta Baltimore who will disposition the patient ? ? ? ? ? ? ? ?Final Clinical Impression(s) / ED Diagnoses ?Final diagnoses:  ?None  ? ? ?Rx / DC Orders ?ED Discharge Orders   ? ? None  ? ?  ? ? ?  ?Lacretia Leigh, MD ?08/17/21 [phone] ? ?

## 2021-08-14 NOTE — Progress Notes (Signed)
Pharmacy Antibiotic Note ? ?Raymond [name] is a 61 y.o. male with hx kidney transplant, DM and chronic diabetic foot wound/ulcer presented to the ED on  08/14/2021 with c/o right foot swelling. Right foot MRI on 08/14/21 showed no evidence of osteomyelitis but had findings with suspicion for "localized fluid/edema or potentially developing abscesses in the setting of a soft tissue infection." Pharmacy has been consulted to dose vancomycin for wound infection.  ? ?Plan: ?- vancomycin 2000 mg IV x1, then 1000 mg q24h for est AUC 437 ?- ceftriaxone 2gm q24h and flagyl 500 mg q12h per MD ?  ?_________________________________________ ?Temp (24hrs), Avg:99.3 ?F (37.4 ?C), Min:98.2 ?F (36.8 ?C), Max:100.3 ?F (37.9 ?C) ? ?Recent Labs  ?Lab 08/14/21 ?1159  ?WBC 14.4*  ?CREATININE 1.49*  ?  ?CrCl cannot be calculated (Unknown ideal weight.).   ? ?No Known Allergies ? ? ?Thank you for allowing pharmacy to be a part of this patient?s care. ? Lynelle Doctor ?08/14/2021 5:57 PM ? ?

## 2021-08-14 NOTE — ED Triage Notes (Signed)
Pt reports having swollen right foot x 5days. Pt also reports open wound on bottom of foot. Hx diabetes.  ?

## 2021-08-14 NOTE — H&P (Signed)
?History and Physical  ? ? ?[name] YKD:[id number] DOB: 12-04-1960 DOA: 08/14/2021 ? ?PCP: [name], FNP  ?Patient coming from: Home ? ?Chief Complaint: Right foot swelling  ? ?HPI: Raymond [name] is a 61 y.o. male with medical history significant of DM, HTN, CKD who presents with acute on chronic diabetic foot wound on the plantar surface of his right foot.  He states that this has been ongoing for about 6 months.  He had followed up with Dr. Jacqualyn Posey of podiatry, infectious disease, outpatient wound clinic, as well as Dr. Gwenlyn Found of cardiology.  He states that nothing has really helped throughout all of this.  2 days ago, he noticed significant right foot swelling.  He did also have one-time episode of nausea and vomiting.  Admits to chills without any documented fevers.  No chest pain, shortness of breath or significant cough.  No recent exposure to COVID-positive patients. ? ?ED Course: WBC 14.4, creatinine 1.49.  MRI of the foot showed extensive soft tissue swelling of the foot with small pockets of fluid without osteomyelitis. ? ?Review of Systems: As per HPI. Otherwise, all other review of systems reviewed and are negative.  ? ?Past Medical History:  ?Diagnosis Date  ? Chronic kidney disease   ? Awaiting transplant  ? Diabetes ([place])   ? Diabetes mellitus without complication ([place])   ? Hypertension   ? Renal disorder   ? Renal insufficiency   ? ? ?Past Surgical History:  ?Procedure Laterality Date  ? BONE BIOPSY Right 03/06/2021  ? Procedure: BONE BIOPSY;  Surgeon: Trula Slade, DPM;  [facility];  Service: Podiatry;  Laterality: Right;  ? CENTRAL VENOUS CATHETER INSERTION Right 01/20/2020  ? Procedure: INSERTION CENTRAL LINE ADULT; Tunneled central line;  Surgeon: Virl Cagey, MD;  Location: [facility];  Service: General;  Laterality: Right;  ? COLONOSCOPY N/A 12/05/2014  ? LZJ:[id number] external and internal hemorrhoid/mild diverticulosis/11 polyps removed  ? ESOPHAGOGASTRODUODENOSCOPY  N/A 12/05/2014  ? TKW:IOXB duodenitis  ? GRAFT APPLICATION Right 35/32/9924  ? Procedure: GRAFT APPLICATION;  Surgeon: Trula Slade, DPM;  [facility];  Service: Podiatry;  Laterality: Right;  ? IR FLUORO GUIDE CV LINE RIGHT  03/01/2019  ? IR PERC TUN PERIT CATH WO PORT S&I /IMAG  06/27/2021  ? IR REMOVAL TUN CV CATH W/O FL  05/10/2019  ? IR REMOVAL TUN CV CATH W/O FL  02/29/2020  ? IR REMOVAL TUN CV CATH W/O FL  07/19/2021  ? IR US GUIDE VASC ACCESS RIGHT  03/01/2019  ? IR US GUIDE VASC ACCESS RIGHT  06/27/2021  ? IRRIGATION AND DEBRIDEMENT ELBOW Left 06/24/2021  ? Procedure: IRRIGATION AND DEBRIDEMENT ELBOW;  Surgeon: Lovell Sheehan, MD;  Location: [facility];  Service: Orthopedics;  Laterality: Left;  ? KIDNEY TRANSPLANT  03/27/2015  ? Penile Pump Insertion    ? TEE WITHOUT CARDIOVERSION N/A 01/17/2020  ? Procedure: TRANSESOPHAGEAL ECHOCARDIOGRAM (TEE) WITH PROPOFOL;  Surgeon: Arnoldo Lenis, MD;  [facility];  Service: Endoscopy;  Laterality: N/A;  ? WOUND DEBRIDEMENT Right 03/06/2021  ? Procedure: DEBRIDEMENT WOUND;  Surgeon: Trula Slade, DPM;  [facility];  Service: Podiatry;  Laterality: Right;  ? ? ? reports that he has never smoked. He has never used smokeless tobacco. He reports that he does not drink alcohol and does not use drugs. ? ?No Known Allergies ? ?Family History  ?Problem Relation Age of Onset  ? Diabetes Mother   ? Heart disease Father   ?  Colon cancer Neg Hx   ? ? ? ?Prior to Admission medications   ?Medication Sig Start Date End Date Taking? Authorizing Provider  ?atorvastatin (LIPITOR) 10 MG tablet Take 1 tablet (10 mg total) by mouth daily. 03/14/21 06/12/21  [name], FNP  ?blood glucose meter kit and supplies KIT Dispense based on patient and insurance preference. Use up to four times daily as directed. (FOR ICD-9 250.00, 250.01). 04/11/20   [name], MD  ?Blood Glucose Monitoring Suppl (ACCU-CHEK GUIDE ME) w/Device KIT 1 Piece by Does not  apply route as directed. 02/18/18   [name], MD  ?cloNIDine (CATAPRES - DOSED IN MG/24 HR) 0.3 mg/24hr patch Place 0.3 mg onto the skin once a week.    [provider]  ?colchicine 0.6 MG tablet Take 0.6 mg by mouth daily. 06/17/21   [provider]  ?Continuous Blood Gluc Receiver (FREESTYLE LIBRE 2 READER) DEVI As directed ?Patient not taking: Reported on 07/25/2021 04/24/20   [name], MD  ?Continuous Blood Gluc Sensor (FREESTYLE LIBRE 2 SENSOR) MISC 1 Piece by Does not apply route every 14 (fourteen) days. ?Patient not taking: Reported on 07/25/2021 04/24/20   [name], MD  ?doxazosin (CARDURA) 4 MG tablet Take 6 mg by mouth daily. 12/18/15   [provider]  ?Dulaglutide (TRULICITY) 9.47 ML/4.6TK SOPN Inject 0.75 mg into the skin once a week. 03/25/21   Renato Shin, MD  ?glucose blood (ACCU-CHEK GUIDE) test strip Use as instructed 4 x daily. E11.65 02/19/18   [name], MD  ?hydrALAZINE (APRESOLINE) 25 MG tablet Take 25 mg by mouth 2 (two) times daily.    [provider]  ?HYDROcodone-acetaminophen (NORCO/VICODIN) 5-325 MG tablet Take 1 tablet by mouth every 6 (six) hours as needed. ?Patient not taking: Reported on 07/25/2021 03/06/21   Trula Slade, DPM  ?Insulin NPH, Human,, Isophane, (NOVOLIN N FLEXPEN) 100 UNIT/ML Kiwkpen Inject 50 Units into the skin every morning. 06/27/21   Terrilee Croak, MD  ?Multiple Vitamin (MULTIVITAMIN) capsule Take 1 capsule by mouth daily.    [provider]  ?silver sulfADIAZINE (SILVADENE) 1 % cream Apply 1 application topically daily. ?Patient not taking: Reported on 07/25/2021 01/31/21   Trula Slade, DPM  ?sodium bicarbonate 650 MG tablet Take 1,300 mg by mouth 3 (three) times daily. 11/02/18   [provider]  ?tacrolimus (PROGRAF) 1 MG capsule Take 3 mg by mouth 2 (two) times daily. Take 3 capsules (61m) in AM & take 3 caps (36m in PM    [provider]   ?triamcinolone (KENALOG) 0.025 % ointment Apply 1 application topically 3 (three) times daily. 01/07/20   [provider]  ?Vitamin D, Ergocalciferol, (DRISDOL) 50000 units CAPS capsule Take 50,000 Units by mouth every 30 (thirty) days.    [provider]  ? ? ?Physical Exam: ?Vitals:  ? 08/14/21 1133 08/14/21 1300 08/14/21 1400 08/14/21 1408  ?BP:  140/82 (!) 169/94   ?Pulse: 77 73 78 78  ?Resp:  18 17   ?Temp:      ?TempSrc:      ?SpO2: 97% 100% 95% 95%  ? ? ?Constitutional: NAD, calm, comfortable ?Eyes: PERRL, lids normal.  Conjunctive a injected  ?Respiratory: Clear to auscultation bilaterally, no wheezing, no crackles. Normal respiratory effort. No accessory muscle use. No conversational dyspnea on room air. ?Cardiovascular: Regular rate and rhythm, no murmurs.  ?Abdomen: Soft, nondistended, nontender to palpation.  ?Musculoskeletal: No joint deformity upper and lower extremities.  No contractures. Normal muscle tone.  ?Skin:  ? ?Neurologic: Alert and oriented, speech fluent, CN 2-12 grossly intact. No focal deficits.   ?Psychiatric: Normal judgment and insight. Normal mood and affect  ? ?Labs on Admission: I have personally reviewed following labs and imaging studies ? ?CBC: ?Recent Labs  ?Lab 08/14/21 ?1159  ?WBC 14.4*  ?NEUTROABS 11.3*  ?HGB 12.0*  ?HCT 38.9*  ?MCV 81.2  ?PLT 244  ? ?Basic Metabolic Panel: ?Recent Labs  ?Lab 08/14/21 ?1159  ?NA 135  ?K 4.4  ?CL 104  ?CO2 25  ?GLUCOSE 85  ?BUN 32*  ?CREATININE 1.49*  ?CALCIUM 9.5  ? ?GFR: ?CrCl cannot be calculated (Unknown ideal weight.). ?Liver Function Tests: ?No results for input(s): AST, ALT, ALKPHOS, BILITOT, PROT, ALBUMIN in the last 168 hours. ?No results for input(s): LIPASE, AMYLASE in the last 168 hours. ?No results for input(s): AMMONIA in the last 168 hours. ?Coagulation Profile: ?No results for input(s): INR, PROTIME in the last 168 hours. ?Cardiac Enzymes: ?No results for input(s): CKTOTAL, CKMB, CKMBINDEX, TROPONINI in the  last 168 hours. ?BNP (last 3 results) ?No results for input(s): PROBNP in the last 8760 hours. ?HbA1C: ?No results for input(s): HGBA1C in the last 72 hours. ?CBG: ?No results for input(s): GLUCAP in the last 168 h

## 2021-08-15 ENCOUNTER — Inpatient Hospital Stay (HOSPITAL_COMMUNITY): Payer: Self-pay | Admitting: Anesthesiology

## 2021-08-15 ENCOUNTER — Encounter (HOSPITAL_COMMUNITY): Payer: Self-pay | Admitting: Internal Medicine

## 2021-08-15 ENCOUNTER — Encounter (HOSPITAL_COMMUNITY)
Admission: EM | Disposition: A | Discharge status: Home / Self Care | Payer: Self-pay | Source: Home / Self Care | Attending: Internal Medicine

## 2021-08-15 ENCOUNTER — Other Ambulatory Visit: Payer: Self-pay

## 2021-08-15 DIAGNOSIS — L02611 Cutaneous abscess of right foot: Secondary | ICD-10-CM

## 2021-08-15 DIAGNOSIS — L97519 Non-pressure chronic ulcer of other part of right foot with unspecified severity: Secondary | ICD-10-CM

## 2021-08-15 DIAGNOSIS — E11628 Type 2 diabetes mellitus with other skin complications: Secondary | ICD-10-CM

## 2021-08-15 DIAGNOSIS — E11621 Type 2 diabetes mellitus with foot ulcer: Secondary | ICD-10-CM

## 2021-08-15 HISTORY — PX: INCISION AND DRAINAGE OF WOUND: SHX1803

## 2021-08-15 LAB — CBC
HCT: 36 % — ABNORMAL LOW (ref 39.0–52.0)
Hemoglobin: 11 g/dL — ABNORMAL LOW (ref 13.0–17.0)
MCH: 25.1 pg — ABNORMAL LOW (ref 26.0–34.0)
MCHC: 30.6 g/dL (ref 30.0–36.0)
MCV: 82.2 fL (ref 80.0–100.0)
Platelets: 195 10*3/uL (ref 150–400)
RBC: 4.38 MIL/uL (ref 4.22–5.81)
RDW: 14.4 % (ref 11.5–15.5)
WBC: 11.9 10*3/uL — ABNORMAL HIGH (ref 4.0–10.5)
nRBC: 0 % (ref 0.0–0.2)

## 2021-08-15 LAB — BASIC METABOLIC PANEL
Anion gap: 6 (ref 5–15)
BUN: 32 mg/dL — ABNORMAL HIGH (ref 6–20)
CO2: 25 mmol/L (ref 22–32)
Calcium: 9.2 mg/dL (ref 8.9–10.3)
Chloride: 107 mmol/L (ref 98–111)
Creatinine, Ser: 1.46 mg/dL — ABNORMAL HIGH (ref 0.61–1.24)
GFR, Estimated: 55 mL/min — ABNORMAL LOW (ref >60)
Glucose, Bld: 121 mg/dL — ABNORMAL HIGH (ref 70–99)
Potassium: 3.8 mmol/L (ref 3.5–5.1)
Sodium: 138 mmol/L (ref 135–145)

## 2021-08-15 LAB — GLUCOSE, CAPILLARY
Glucose-Capillary: 105 mg/dL — ABNORMAL HIGH (ref 70–99)
Glucose-Capillary: 113 mg/dL — ABNORMAL HIGH (ref 70–99)
Glucose-Capillary: 104 mg/dL — ABNORMAL HIGH (ref 70–99)
Glucose-Capillary: 138 mg/dL — ABNORMAL HIGH (ref 70–99)

## 2021-08-15 SURGERY — IRRIGATION AND DEBRIDEMENT WOUND
Anesthesia: General | Laterality: Right

## 2021-08-15 MED ORDER — FENTANYL CITRATE (PF) 100 MCG/2ML IJ SOLN
INTRAMUSCULAR | Status: AC
  Filled 2021-08-15: qty 2

## 2021-08-15 MED ORDER — PROPOFOL 10 MG/ML IV BOLUS
INTRAVENOUS | Status: AC
  Filled 2021-08-15: qty 20

## 2021-08-15 MED ORDER — TACROLIMUS 1 MG PO CAPS
4.0000 mg | ORAL_CAPSULE | Freq: Every day | ORAL | Status: DC
  Administered 2021-08-15 – 2021-08-17 (×3): 4 mg via ORAL
  Filled 2021-08-15 (×3): qty 4

## 2021-08-15 MED ORDER — PHENYLEPHRINE HCL (PRESSORS) 10 MG/ML IV SOLN
INTRAVENOUS | Status: AC
  Filled 2021-08-15: qty 1

## 2021-08-15 MED ORDER — TACROLIMUS 1 MG PO CAPS
3.0000 mg | ORAL_CAPSULE | Freq: Every day | ORAL | Status: DC
  Administered 2021-08-16 – 2021-08-18 (×3): 3 mg via ORAL
  Filled 2021-08-15 (×3): qty 3

## 2021-08-15 MED ORDER — PROPOFOL 10 MG/ML IV BOLUS
INTRAVENOUS | Status: DC | PRN
  Administered 2021-08-15: 150 mg via INTRAVENOUS

## 2021-08-15 MED ORDER — BUPIVACAINE HCL (PF) 0.5 % IJ SOLN
INTRAMUSCULAR | Status: AC
  Filled 2021-08-15: qty 30

## 2021-08-15 MED ORDER — LIDOCAINE HCL 1 % IJ SOLN
INTRAMUSCULAR | Status: DC | PRN
  Administered 2021-08-15: 20 mL

## 2021-08-15 MED ORDER — ADULT MULTIVITAMIN W/MINERALS CH
1.0000 | ORAL_TABLET | Freq: Every day | ORAL | Status: DC
  Administered 2021-08-16 – 2021-08-18 (×3): 1 via ORAL
  Filled 2021-08-15 (×3): qty 1

## 2021-08-15 MED ORDER — JUVEN PO PACK
1.0000 | PACK | Freq: Two times a day (BID) | ORAL | Status: DC
  Administered 2021-08-15 – 2021-08-18 (×6): 1 via ORAL
  Filled 2021-08-15 (×7): qty 1

## 2021-08-15 MED ORDER — ENSURE MAX PROTEIN PO LIQD
11.0000 [oz_av] | Freq: Every day | ORAL | Status: DC
  Administered 2021-08-16 – 2021-08-17 (×2): 11 [oz_av] via ORAL
  Filled 2021-08-15 (×4): qty 330

## 2021-08-15 MED ORDER — ALBUMIN HUMAN 5 % IV SOLN: INTRAVENOUS | Status: DC | PRN

## 2021-08-15 MED ORDER — CHLORHEXIDINE GLUCONATE 0.12 % MT SOLN
15.0000 mL | Freq: Once | OROMUCOSAL | Status: AC
  Administered 2021-08-15: 15 mL via OROMUCOSAL

## 2021-08-15 MED ORDER — LIDOCAINE HCL (CARDIAC) PF 100 MG/5ML IV SOSY
PREFILLED_SYRINGE | INTRAVENOUS | Status: DC | PRN
  Administered 2021-08-15: 60 mg via INTRAVENOUS

## 2021-08-15 MED ORDER — FENTANYL CITRATE (PF) 100 MCG/2ML IJ SOLN
INTRAMUSCULAR | Status: DC | PRN
  Administered 2021-08-15: 100 ug via INTRAVENOUS
  Administered 2021-08-15: 25 ug via INTRAVENOUS

## 2021-08-15 MED ORDER — SODIUM CHLORIDE 0.9 % IV SOLN: INTRAVENOUS | Status: AC

## 2021-08-15 MED ORDER — PHENYLEPHRINE HCL (PRESSORS) 10 MG/ML IV SOLN
INTRAVENOUS | Status: DC | PRN
  Administered 2021-08-15: 80 ug via INTRAVENOUS

## 2021-08-15 MED ORDER — MIDAZOLAM HCL 5 MG/5ML IJ SOLN
INTRAMUSCULAR | Status: DC | PRN
Start: 2021-08-15 — End: 2021-08-15
  Administered 2021-08-15: 1 mg via INTRAVENOUS

## 2021-08-15 MED ORDER — ZINC SULFATE 220 (50 ZN) MG PO CAPS
220.0000 mg | ORAL_CAPSULE | Freq: Every day | ORAL | Status: DC
  Administered 2021-08-16 – 2021-08-18 (×3): 220 mg via ORAL
  Filled 2021-08-15 (×3): qty 1

## 2021-08-15 MED ORDER — LIDOCAINE HCL (PF) 1 % IJ SOLN
INTRAMUSCULAR | Status: AC
  Filled 2021-08-15: qty 30

## 2021-08-15 MED ORDER — ASCORBIC ACID 500 MG PO TABS
250.0000 mg | ORAL_TABLET | Freq: Two times a day (BID) | ORAL | Status: DC
  Administered 2021-08-15 – 2021-08-18 (×6): 250 mg via ORAL
  Filled 2021-08-15 (×6): qty 1

## 2021-08-15 MED ORDER — MIDAZOLAM HCL 2 MG/2ML IJ SOLN
INTRAMUSCULAR | Status: AC
  Filled 2021-08-15: qty 2

## 2021-08-15 MED ORDER — ONDANSETRON HCL 4 MG/2ML IJ SOLN
INTRAMUSCULAR | Status: DC | PRN
  Administered 2021-08-15: 4 mg via INTRAVENOUS

## 2021-08-15 MED ORDER — PHENYLEPHRINE HCL-NACL 20-0.9 MG/250ML-% IV SOLN
INTRAVENOUS | Status: DC | PRN
Start: 2021-08-15 — End: 2021-08-15
  Administered 2021-08-15: 25 ug/min via INTRAVENOUS

## 2021-08-15 SURGICAL SUPPLY — 51 items
BLADE HEX COATED 2.75 (ELECTRODE) ×1 IMPLANT
BLADE OSC/SAG .038X5.5 CUT EDG (BLADE) ×2 IMPLANT
BLADE SURG 15 STRL LF DISP TIS (BLADE) ×2 IMPLANT
BLADE SURG 15 STRL SS (BLADE) ×2
BNDG COHESIVE 1X5 TAN STRL LF (GAUZE/BANDAGES/DRESSINGS) ×1 IMPLANT
BNDG COHESIVE 3X5 TAN ST LF (GAUZE/BANDAGES/DRESSINGS) ×2 IMPLANT
BNDG CONFORM 2 STRL LF (GAUZE/BANDAGES/DRESSINGS) ×2 IMPLANT
BNDG ELASTIC 3X5.8 VLCR STR LF (GAUZE/BANDAGES/DRESSINGS) ×2 IMPLANT
BNDG ELASTIC 4X5.8 VLCR STR LF (GAUZE/BANDAGES/DRESSINGS) ×1 IMPLANT
BNDG ESMARK 4X9 LF (GAUZE/BANDAGES/DRESSINGS) ×2 IMPLANT
BNDG GAUZE ELAST 4 BULKY (GAUZE/BANDAGES/DRESSINGS) ×2 IMPLANT
COVER BACK TABLE 60X90IN (DRAPES) ×2 IMPLANT
CUFF TOURN SGL QUICK 18X4 (TOURNIQUET CUFF) ×2 IMPLANT
DRAPE EXTREMITY T 121X128X90 (DISPOSABLE) ×2 IMPLANT
DRAPE IMP U-DRAPE 54X76 (DRAPES) ×2 IMPLANT
DRAPE OEC MINIVIEW 54X84 (DRAPES) ×2 IMPLANT
DRAPE SURG 17X23 STRL (DRAPES) ×2 IMPLANT
DRSG PAD ABDOMINAL 8X10 ST (GAUZE/BANDAGES/DRESSINGS) ×3 IMPLANT
DURAPREP 26ML APPLICATOR (WOUND CARE) ×2 IMPLANT
ELECT REM PT RETURN 15FT ADLT (MISCELLANEOUS) ×2 IMPLANT
GAUZE 4X4 16PLY ~~LOC~~+RFID DBL (SPONGE) ×1 IMPLANT
GAUZE PACKING IODOFORM 1/4X15 (PACKING) ×1 IMPLANT
GAUZE SPONGE 4X4 12PLY STRL (GAUZE/BANDAGES/DRESSINGS) ×2 IMPLANT
GAUZE XEROFORM 1X8 LF (GAUZE/BANDAGES/DRESSINGS) ×2 IMPLANT
GLOVE SURG ENC MOIS LTX SZ6.5 (GLOVE) ×2 IMPLANT
GLOVE SURG UNDER POLY LF SZ6.5 (GLOVE) ×2 IMPLANT
GOWN STRL REUS W/ TWL LRG LVL3 (GOWN DISPOSABLE) ×2 IMPLANT
GOWN STRL REUS W/TWL LRG LVL3 (GOWN DISPOSABLE) ×2
KIT BASIN OR (CUSTOM PROCEDURE TRAY) ×2 IMPLANT
NDL HYPO 25X1 1.5 SAFETY (NEEDLE) ×2 IMPLANT
NDL SAFETY ECLIPSE 18X1.5 (NEEDLE) ×1 IMPLANT
NEEDLE HYPO 18GX1.5 SHARP (NEEDLE) ×1
NEEDLE HYPO 25X1 1.5 SAFETY (NEEDLE) ×4 IMPLANT
NS IRRIG 1000ML POUR BTL (IV SOLUTION) ×1 IMPLANT
PADDING CAST ABS 4INX4YD NS (CAST SUPPLIES) ×1
PADDING CAST ABS COTTON 4X4 ST (CAST SUPPLIES) ×1 IMPLANT
PENCIL SMOKE EVACUATOR (MISCELLANEOUS) ×2 IMPLANT
PROBE DEBRIDE SONICVAC MISONIX (TIP) ×1 IMPLANT
SLEEVE SCD COMPRESS KNEE MED (STOCKING) ×2 IMPLANT
SPONGE SURGIFOAM ABS GEL 100 (HEMOSTASIS) ×1 IMPLANT
STOCKINETTE 4X48 STRL (DRAPES) ×2 IMPLANT
STOCKINETTE 6  STRL (DRAPES) ×1
STOCKINETTE 6 STRL (DRAPES) ×1 IMPLANT
SUT ETHILON 3 0 PS 1 (SUTURE) ×2 IMPLANT
SUT ETHILON 4 0 PS 2 18 (SUTURE) IMPLANT
SUT VIC AB 3-0 FS2 27 (SUTURE) IMPLANT
SYR 10ML LL (SYRINGE) ×4 IMPLANT
SYR BULB EAR ULCER 3OZ GRN STR (SYRINGE) ×2 IMPLANT
TOWEL OR 17X26 10 PK STRL BLUE (TOWEL DISPOSABLE) ×2 IMPLANT
TUBE IRRIGATION SET MISONIX (TUBING) ×1 IMPLANT
UNDERPAD 30X36 HEAVY ABSORB (UNDERPADS AND DIAPERS) ×2 IMPLANT

## 2021-08-15 NOTE — Anesthesia Procedure Notes (Signed)
Procedure Name: LMA Insertion ?Date/Time: 08/15/2021 5:34 PM ?Performed by: Lissa Morales, CRNA ?Pre-anesthesia Checklist: Patient identified, Emergency Drugs available and Suction available ?Patient Re-evaluated:Patient Re-evaluated prior to induction ?Oxygen Delivery Method: Circle system utilized ?Preoxygenation: Pre-oxygenation with 100% oxygen ?Induction Type: IV induction ?Ventilation: Mask ventilation without difficulty ?LMA: LMA with gastric port inserted ?LMA Size: 5.0 ?Tube type: Oral ?Number of attempts: 1 ?Placement Confirmation: positive ETCO2 ?Tube secured with: Tape ?Dental Injury: Teeth and Oropharynx as per pre-operative assessment  ? ? ? ? ?

## 2021-08-15 NOTE — TOC Initial Note (Signed)
Transition of Care (TOC) - Initial/Assessment Note  ? ? ?Patient Details  ?Name: Raymond [name] ?MRN: [id number] ?Date of Birth: Sep 10, 1960 ? ?Transition of Care (TOC) CM/SW Contact:    ?Novelle Addair, LCSW ?Phone Number: ?08/15/2021, 1:28 PM ? ?Clinical Narrative:                 ?Met with pt to introduce self/ TOC role with dc planning.  Pt very pleasant and currently awaiting I&D this afternoon for foot ulcer.  Pt confirms he has very good support from wife in the home and adds that she managed his home IV abx coverage with last hospital dc.  He is not sure if this will be needed again.  He confirms that he gets meds thru [facility]) and pays OOP as he is currently uninsured.  Explained that TOC will follow for any dc needs and plan to follow up with him again in the morning. ? ?Expected Discharge Plan: [place] (vs home / self care) ?Barriers to Discharge: Continued Medical Work up ? ? ?Patient Goals and CMS Choice ?Patient states their goals for this hospitalization and ongoing recovery are:: return home ?  ?  ? ?Expected Discharge Plan and Services ?Expected Discharge Plan: [place] (vs home / self care) ?In-house Referral: Clinical Social Work ?  ?  ?Living arrangements for the past 2 months: [place] ?                ?  ?  ?  ?  ?  ?  ?  ?  ?  ?  ? ?Prior Living Arrangements/Services ?Living arrangements for the past 2 months: [place] ?Lives with:: Spouse ?Patient language and need for interpreter reviewed:: Yes ?Do you feel safe going back to the place where you live?: Yes      ?Need for Family Participation in Patient Care: Yes (Comment) ?Care giver support system in place?: Yes (comment) ?  ?Criminal Activity/Legal Involvement Pertinent to Current Situation/Hospitalization: No - Comment as needed ? ?Activities of Daily Living ?Home Assistive Devices/Equipment: None ?ADL Screening (condition at time of admission) ?Patient's cognitive ability  adequate to safely complete daily activities?: Yes ?Is the patient deaf or have difficulty hearing?: No ?Does the patient have difficulty seeing, even when wearing glasses/contacts?: No ?Does the patient have difficulty concentrating, remembering, or making decisions?: No ?Patient able to express need for assistance with ADLs?: Yes ?Does the patient have difficulty dressing or bathing?: No ?Independently performs ADLs?: Yes (appropriate for developmental age) ?Does the patient have difficulty walking or climbing stairs?: No ?Weakness of Legs: None ?Weakness of Arms/Hands: None ? ?Permission Sought/Granted ?Permission sought to share information with : Family Supports ?Permission granted to share information with : Yes, Verbal Permission Granted ? Share Information with NAME: Raymond [name] ?   ? Permission granted to share info w Relationship: spouse ? Permission granted to share info w Contact Information: [phone] ? ?Emotional Assessment ?Appearance:: Appears older than stated age ?Attitude/Demeanor/Rapport: Gracious ?Affect (typically observed): Accepting ?Orientation: : Oriented to Self, Oriented to Place, Oriented to  Time, Oriented to Situation ?Alcohol / Substance Use: Not Applicable ?Psych Involvement: No (comment) ? ?Admission diagnosis:  Cellulitis [L03.90] ?Cellulitis of right lower extremity [L03.115] ?Patient Active Problem List  ? Diagnosis Date Noted  ? Diabetic foot ulcer ([place]) 08/14/2021  ? CKD (chronic kidney disease) stage 3, GFR 30-59 ml/min (HCC) 08/14/2021  ? Hypomagnesemia 06/25/2021  ? Septic arthritis of elbow, left (  [place]) 06/24/2021  ? Peripheral vascular disease ([place]) 04/24/2020  ? Pyogenic inflammation of bone (HCC)   ? Thrombophlebitis of superficial veins of right lower extremity   ? Difficult intravenous access   ? MRSA bacteremia   ? Diabetic foot infection ([place])   ? Infected wound 01/12/2020  ? Critical lower limb ischemia ([place]) 02/02/2019  ? Personal history of noncompliance with  medical treatment, presenting hazards to health 10/07/2018  ? Critical limb ischemia with history of revascularization of same extremity ([place]) 07/27/2018  ? Mixed hyperlipidemia 02/19/2018  ? History of renal transplant 02/19/2018  ? Conductive hearing loss, bilateral 08/31/2017  ? Nuclear sclerotic cataract of right eye 10/07/2016  ? Pseudophakia of left eye 10/07/2016  ? Hypertension due to endocrine disorder 03/04/2016  ? 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 Renal failure 03/04/2016  ? Proliferative diabetic retinopathy with macular edema associated with type 2 diabetes mellitus ([place]) 03/04/2016  ? Immunosuppression ([place]) 12/10/2015  ? Nausea vomiting and diarrhea 12/10/2015  ? Type 2 diabetes mellitus ([place]) 12/10/2015  ? Essential hypertension, benign 12/10/2015  ? Pancytopenia ([place]) 12/10/2015  ? Encounter for screening colonoscopy 11/15/2014  ? GERD (gastroesophageal reflux disease) 11/15/2014  ? ?PCP:  [name], FNP ?Pharmacy:   ?[place], [place] [address] ?[address] ?[place] [address] ?Phone: [phone] Fax: [phone] ? ?[place], [place] ?[address] ?[place] [address] ?Phone: [phone] Fax: [phone] ? ?[id number] - [place], [place] [address] ?Phone: [phone] Fax: [phone] ? ? ? ? ?Social Determinants of Health (SDOH) Interventions ?  ? ?Readmission Risk Interventions ? ?  08/15/2021  ?  1:24 PM  ?Readmission Risk Prevention Plan  ?Transportation Screening Complete  ?PCP or Specialist Appt within 5-7 Days Complete  ?Home Care Screening Complete  ? ? ? ?

## 2021-08-15 NOTE — Discharge Instructions (Signed)

## 2021-08-15 NOTE — Consult Note (Addendum)
WOC consult requested prior to podiatry team involvement.  ?Dr Cannon Kettle has assessed the patient's foot wound and indicates the following in her progress notes, "Patient is agreeable to surgical irrigation and debridement of the wound. Patient to be n.p.o. after 8 AM for surgery on tomorrow evening." ?Please refer to the podiatry service for further questions regarding plan of care. ?Please re-consult if further assistance is needed.  Thank-you,  ?Julien Girt MSN, RN, [place], [place], CNS ?[phone]  ?

## 2021-08-15 NOTE — Op Note (Signed)
DATE: 08/15/2021 ? ?SURGEON: Landis Martins, DPM ? ?PREOPERATIVE DIAGNOSIS:  Right foot cellulitis, abscess and diabetic foot ulceration ? ?POSTOPERATIVE DIAGNOSIS: Same ? ?PROCEDURE PERFORMED: Right incision and drainage with debridement and wound culture  ? ?HEMOSTASIS:  Right ankle tourniquet ? ?ESTIMATED BLOOD LOSS:  350 ? ?ANESTHESIA:  MAC with local 20cc of 1:1 mixture 1% lidocaine and 0.5% marcaine plain ? ?SPECIMENS ? ?MICRO: Wound cultures ? ?COMPLICATIONS:  None. ? ? ?INDICATIONS FOR PROCEDURE:  This patient is a pleasant 61 y.o. diabetic male seen at bedside after being admitted for right foot infection.  At this time, all risks, complications, benefits, and alternatives were explained in detail to the patient. Patient opts for Right foot incision and drainage with debridement and wound culture.  Risks and complications include but are not limited to infection, recurrence of symptoms, pain, numbness, wound dehiscence, delayed healing, as well as need for future surgery/amputation.  No guarantees were given or applied.  All questions were answered to the patient?s satisfaction, and the patient has consented to the above procedure.  All preoperative labs and H&P, medical clearances have been obtained and NPO status past 8am has been confirmed. ? ?PREPARATION FOR PROCEDURE:     The patient was brought to the operating room and placed on the operating table in supine position.  A pneumatic ankletourniquet was placed about the patient's Right foot but not yet inflated.  After the department of anesthesia had administered LMA anesthesia. A local block was administered and  The Right foot was then scrubbed, prepped, and draped in the usual aseptic manner.  An Esmarch bandage was utilized to exsanguinate the patient?s Right foot and leg, and the pneumatic tourniquet was inflated to 250 mmHg. ? ?PROCEDURE IN DETAIL:  At this time, attention was directed to the patient?s Right foot where there was of note plantar  wound that measured 2 x 1-1/2 cm down to the level of fatty tissue. Using a 15 blade the plantar foot was incised at the wound site in elliptical fashion through skin and subcutaneous tissue. Bloody and purulent drainage was expressed and then using tenotomy scissors all nonviable tissue was excised from the wound bed, the infection was noted to track into the medial arch therefore the incision was continued in this direction. Deep wound cultures were obtained and then pulse irrigation was performed. Following because there was significant drainage coming from in between the tendons my sonics ultrasound debridement device was used to gently debride the wound bed to help evacuate the purulent drainage and abscesses.  Postdebridement the wound measures 7 cm x 3 cm in width with tendon and fatty tissue exposed.  Hemostasis was achieved best ability using electric cautery and Bovie.  Then the remaining wound bed was packed open using Surgicel foam. The Right foot was then dressed with quarter inch iodoform packing, 4 x 4 gauze, abd, Kerlix, and Coban and ACE.  At this time, the Right pneumatic tourniquet was deflated, and a positive hyperemic response was noted Right digits.  The patient tolerated the procedure and anesthesia well.  Upon transfer to the recovery room, the patient?s vital signs were stable, and neurovascular status was intact.   ? ?Patient to be transferred back to medical floor to continue on IV antibiotics to tailor pending intra-op cultures. ? ?Podiatry to follow ? ?Landis Martins, DPM ?  ?  ?  ?

## 2021-08-15 NOTE — Progress Notes (Signed)
Inpatient Diabetes Program Recommendations ? ?AACE/ADA: New Consensus Statement on Inpatient Glycemic Control (2015) ? ?Target Ranges:  Prepandial:   less than 140 mg/dL ?     Peak postprandial:   less than 180 mg/dL (1-2 hours) ?     Critically ill patients:  140 - 180 mg/dL  ? ?Lab Results  ?Component Value Date  ? GLUCAP 113 (H) 08/15/2021  ? HGBA1C 10.4 (H) 08/14/2021  ? ? ?Review of Glycemic Control ? ?Diabetes history: DM2 ?Outpatient Diabetes medications: Novolin N 60 units BID, Trulicity 3.53 weekly ?Current orders for Inpatient glycemic control: Semglee 15 units QHS, Novolog 0-15 units TID ? ?HgbA1C - 10.4% ?Blood sugars trending well. ? ?Inpatient Diabetes Program Recommendations:   ? ?Agree with orders. ? ?Spoke with pt at bedside regarding his diabetes and HgbA1C of 10.4%. Pt states he takes his NPH and Trulicity as prescribed. Does not check blood sugars every day. Discussed HgbA1C means average blood sugar of 252 mg/dL. Pt states he will start checking his sugar at least 2x/day and f/u with his PCP. Stressed importance of tight glycemic control for healing. Pt appreciative of visit. ? ?Continue to follow. ? ?Thank you. ?Lorenda Peck, RD, LDN, CDE ?Inpatient Diabetes Coordinator ?[phone]  ? ? ? ? ? ? ? ?

## 2021-08-15 NOTE — Transfer of Care (Signed)
Immediate Anesthesia Transfer of Care Note ? ?Patient: Raymond [name] ? ?Procedure(s) Performed: IRRIGATION AND DEBRIDEMENT WOUND (Right) ? ?Patient Location: PACU ? ?Anesthesia Type:General ? ?Level of Consciousness: drowsy ? ?Airway & Oxygen Therapy: Patient Spontanous Breathing and Patient connected to face mask ? ?Post-op Assessment: Report given to RN and Post -op Vital signs reviewed and stable ? ?Post vital signs: Reviewed and stable ? ?Last Vitals:  ?Vitals Value Taken Time  ?BP 137/72 08/15/21 1852  ?Temp    ?Pulse    ?Resp 17 08/15/21 1853  ?SpO2    ?Vitals shown include unvalidated device data. ? ?Last Pain:  ?Vitals:  ? 08/15/21 1721  ?TempSrc: Oral  ?PainSc: 0-No pain  ?   ? ?Patients Stated Pain Goal: 2 (08/15/21 1336) ? ?Complications: No notable events documented. ?

## 2021-08-15 NOTE — H&P (Signed)
Brief podiatry H&P ? ?Patient met preoperatively in the holding area.  Patient confirms n.p.o. since midnight.  Patient is consented for right foot irrigation debridement plantar wound for cellulitis and abscess and history of diabetic foot ulcer of the right foot.  Consent signed.  All questions answered.  No guarantees given or implied.  No family available to talk to postop.  Patient reports that he will update his family himself after the surgery. ? ?Dr. Landis Martins ?On Call provider  ?[facility] ?[phone] office ?[phone] cell ?Available via secure chat  ? ?

## 2021-08-15 NOTE — Progress Notes (Signed)
Initial Nutrition Assessment ? ?DOCUMENTATION CODES:  ? ?Obesity unspecified ? ?INTERVENTION:  ?- will check serum vitamin C, copper, and zinc on 3/24 at 0500 due to prolonged, non-healing wound ?- will order 250 mg ascorbic acid BID and 220 mg zinc sulfate/day x14 days starting 3/24 at 1000 (cleared by MD). ? ?- will order Ensure Max once/day, each supplement provides 150 kcal and 30 grams of protein. ?- will order 1 packet Juven BID, each packet provides 95 calories, 2.5 grams of protein (collagen), and 9.8 grams of carbohydrate (3 grams sugar); also contains 7 grams of L-arginine and L-glutamine, 300 mg vitamin C, 15 mg vitamin E, 1.2 mcg vitamin B-12, 9.5 mg zinc, 200 mg calcium, and 1.5 g  Calcium Beta-hydroxy-Beta-methylbutyrate to support wound healing. ?- will order 1 tablet multivitamin with minerals daily ? ?- will enter Carbohydrate Counting for People with Diabetes handout in AVS. ? ? ?NUTRITION DIAGNOSIS:  ? ?Increased nutrient needs related to acute illness, wound healing as evidenced by estimated needs. ? ?GOAL:  ? ?Patient will meet greater than or equal to 90% of their needs ? ?MONITOR:  ? ?PO intake, Supplement acceptance, Labs, Weight trends, Skin ? ?REASON FOR ASSESSMENT:  ? ?Consult ?Wound healing ? ?ASSESSMENT:  ? ?61 y.o. male with medical history of DM, HTN, and CKD. He presented to the ED with chronic foot wound on R foot which has been present for 6 months. He was admitted and started on IV abx and plan for surgical I&D on 3/23 evening. ? ?Patient laying in bed with no visitors present at the time of RD visit. Patient has been NPO since 0800 today pending I&D to R foot wound. He was on a Carb Modified diet yesterday from 1750-midnight but no dinner meal intake recorded. ? ?Patient shares that he often is unsure of what to eat d/t DM and indicates that medical team will tell him to eat a specific food but not provide insight into why (he gives the example of a hamburger). ? ?He denies any  changes in appetite or PO intakes. He is aware of plan for I&D tonight and inability to eat until after that. He denies taking anything at home to aid in wound healing (such as vitamin C). ? ?Informed patient of plan to check serum vitamin C, copper, and zinc and provided rationale; patient agreeable. ? ?At that time, he received a phone call on both his cell phone and room phone and RD excused self from the room so patient could attend to calls.  ? ?Weight yesterday was 218 lb and weight on 07/25/21 was 223 lb. This indicates 5 lb weight loss (2.2% body weight) in ~3 weeks. Weight on 06/24/21 was 229 lb. This indicates 11 lb weight loss (4.8% body weight) in the past 2 months; not significant for time frame. ? ? ?Labs reviewed; CBGs: 105 and 113 mg/dl, BUN: 32 mg/dl, creatinine: 1.46 mg/dl, GFR: 55 ml/min. ? ?Medications reviewed; sliding scale novolog, 15 units semglee/day, 13000 units sodium bicarb TID, 3 mg prograf/day, 4 mg prograf/night. ? ?IVF; NS @ 50 ml/hr.  ?  ? ?NUTRITION - FOCUSED PHYSICAL EXAM: ? ?Flowsheet Row Most Recent Value  ?Orbital Region No depletion  ?Upper Arm Region No depletion  ?Thoracic and Lumbar Region Unable to assess  ?Buccal Region No depletion  ?Temple Region No depletion  ?Clavicle Bone Region No depletion  ?Clavicle and Acromion Bone Region No depletion  ?Scapular Bone Region No depletion  ?Dorsal Hand No depletion  ?Patellar Region  Unable to assess  ?Anterior Thigh Region Unable to assess  ?Posterior Calf Region Unable to assess  ?Edema (RD Assessment) Unable to assess  ?Hair Reviewed  ?Eyes Reviewed  ?Mouth Reviewed  ?Skin Reviewed  ?Nails Reviewed  ? ?  ? ? ?Diet Order:   ?Diet Order   ? ?       ?  Diet NPO time specified Except for: Sips with Meds  Diet effective ____       ?  ? ?  ?  ? ?  ? ? ?EDUCATION NEEDS:  ? ?Not appropriate for education at this time ? ?Skin:  Skin Assessment: Skin Integrity Issues: ?Skin Integrity Issues:: Diabetic Ulcer ?Diabetic Ulcer: R foot ? ?Last BM:   PTA/unknown ? ?Height:  ? ?Ht Readings from Last 1 Encounters:  ?08/14/21 '5\' 7"'$  (1.702 m)  ? ? ?Weight:  ? ?Wt Readings from Last 1 Encounters:  ?08/14/21 98.8 kg  ? ? ? ?BMI:  Body mass index is 34.11 kg/m?. ? ?Estimated Nutritional Needs:  ?Kcal:  2300-2500 kcal ?Protein:  115-130 grams ?Fluid:  >/= 2.3 L/day ? ? ? ? ?Raymond Matin, MS, RD, LDN ?Registered Dietitian II ?Inpatient Clinical Nutrition ?RD pager # and on-call/weekend pager # available in [place]  ? ?

## 2021-08-15 NOTE — Anesthesia Preprocedure Evaluation (Addendum)
Anesthesia Evaluation  ?Patient identified by MRN, date of birth, ID band ?Patient awake ? ? ? ?Reviewed: ?Allergy & Precautions, NPO status , Patient's Chart, lab work & pertinent test results ? ?Airway ?Mallampati: II ? ?TM Distance: >3 FB ?Neck ROM: Full ? ? ? Dental ? ?(+) Missing, Poor Dentition, Dental Advisory Given, Chipped ?  ?Pulmonary ?neg pulmonary ROS,  ?  ?Pulmonary exam normal ?breath sounds clear to auscultation ? ? ? ? ? ? Cardiovascular ?hypertension (176/85 in preop), Pt. on medications ?+ Peripheral Vascular Disease  ?Normal cardiovascular exam ?Rhythm:Regular Rate:Normal ? ?Echo 2021: ??1. Left ventricular ejection fraction, by estimation, is 55 to 60%. The left ventricle has normal function. The left ventricle has no regional wall motion abnormalities. There is moderate left ventricular hypertrophy. Left ventricular diastolic parameters are indeterminate.  ??2. Right ventricular systolic function is normal. The right ventricular size is normal. There is normal pulmonary artery systolic pressure.  ??3. The mitral valve is normal in structure. No evidence of mitral valve regurgitation.  ??4. The aortic valve is tricuspid. Aortic valve regurgitation is not visualized. Mild aortic valve sclerosis is present, with no evidence of aortic valve stenosis.  ??5. The inferior vena cava is normal in size with greater than 50% respiratory variability, suggesting right atrial pressure of 3 mmHg.  ?  ?Neuro/Psych ?negative neurological ROS ? negative psych ROS  ? GI/Hepatic ?Neg liver ROS, GERD  Medicated and Controlled,  ?Endo/Other  ?diabetes, Poorly Controlled, Type 2, Insulin DependentBMI 34 ?a1c 10.4 ?FS 92 in preop  ? Renal/GU ?CRFRenal diseaseCr 1.46 s/p renal transplant   ?negative genitourinary ?  ?Musculoskeletal ? ?(+) Arthritis , Osteoarthritis,   ? Abdominal ?(+) + obese,   ?Peds ? Hematology ?negative hematology ROS ?(+) hct 36    ?Anesthesia Other Findings ? ?  Reproductive/Obstetrics ?negative OB ROS ? ?  ? ? ? ? ? ? ? ? ? ? ? ? ? ?  ?  ? ? ? ? ? ? ? ?Anesthesia Physical ?Anesthesia Plan ? ?ASA: 3 ? ?Anesthesia Plan: General  ? ?Post-op Pain Management: Ofirmev IV (intra-op)*  ? ?Induction: Intravenous ? ?PONV Risk Score and Plan: 2 and Ondansetron, Dexamethasone, Midazolam and Treatment may vary due to age or medical condition ? ?Airway Management Planned: LMA ? ?Additional Equipment: None ? ?Intra-op Plan:  ? ?Post-operative Plan: Extubation in OR ? ?Informed Consent: I have reviewed the patients History and Physical, chart, labs and discussed the procedure including the risks, benefits and alternatives for the proposed anesthesia with the patient or authorized representative who has indicated his/her understanding and acceptance.  ? ? ? ?Dental advisory given ? ?Plan Discussed with: CRNA ? ?Anesthesia Plan Comments:   ? ? ? ? ? ?Anesthesia Quick Evaluation ? ?

## 2021-08-15 NOTE — Progress Notes (Signed)
?PROGRESS NOTE ? ? ? ?Raymond [name]  BBC:[id number] DOB: 1961-03-13 DOA: 08/14/2021 ?PCP: [name], FNP  ? ?  ?Brief Narrative:  ?Raymond [name] is a 61 y.o. male with medical history significant of DM, HTN, CKD who presents with acute on chronic diabetic foot wound on the plantar surface of his right foot.  He states that this has been ongoing for about 6 months.  He had followed up with Dr. Jacqualyn Posey of podiatry, infectious disease, outpatient wound clinic, as well as Dr. Gwenlyn Found of cardiology.  He states that nothing has really helped throughout all of this.  2 days ago, he noticed significant right foot swelling.  He did also have one-time episode of nausea and vomiting.  Admits to chills without any documented fevers.  No chest pain, shortness of breath or significant cough.  No recent exposure to COVID-positive patients. WBC 14.4, creatinine 1.49.  MRI of the foot showed extensive soft tissue swelling of the foot with small pockets of fluid without osteomyelitis. Patient admitted and started on IV antibiotics.  Podiatry consulted. ? ?New events last 24 hours / Subjective: ?No new complaints, continues to have right foot swelling and pain.  Awaiting surgical debridement later today. ? ?Assessment & Plan: ?  ?Principal Problem: ?  Diabetic foot ulcer ([place]) ?Active Problems: ?  Type 2 diabetes mellitus ([place]) ?  Essential hypertension, benign ?  Mixed hyperlipidemia ?  History of renal transplant ?  CKD (chronic kidney disease) stage 3, GFR 30-59 ml/min (HCC) ? ? ?Nonhealing diabetic right foot wound  ?-MRI did not reveal osteomyelitis, but did see extensive soft tissue swelling of the foot with small pockets of fluid within the deep intrinsic muscular compartment representing localized fluid/edema or developing abscesses ?-Has followed with Dr. Jacqualyn Posey in the past (last seen Nov 2022).  Podiatry consulted and planning for surgical irrigation and debridement today ?-Vancomycin, Rocephin, Flagyl ?-Followed by Dr.  Gwenlyn Found for history of critical limb ischemia.  Was most recently seen in July 25, 2021 regarding his poorly healing wounds and was determined to not require any invasive peripheral vascular procedure.  Patient to follow up with Dr. Gwenlyn Found on discharge. ?  ?DM ?-Semglee, SSI ?  ?CKD stage 3a s/p renal transplant  ?-Baseline Cr ~1.1-1.4  ?-Continue tacrolimus, sodium bicarb  ?-Stable  ?  ?HTN ?-Catapres patch, hydralazine, doxazosin  ?  ?HLD ?-Lipitor  ? ? ?DVT prophylaxis:  ?heparin injection 5,000 Units Start: 08/14/21 2200 ? ?Code Status: Full code ?Family Communication: No family at bedside ?Disposition Plan:  ?Status is: Inpatient ?Remains inpatient appropriate because: IV antibiotics and OR today ? ?Antimicrobials:  ?Anti-infectives (From admission, onward)  ? ? Start     Dose/Rate Route Frequency Ordered Stop  ? 08/15/21 1800  vancomycin (VANCOCIN) IVPB 1000 mg/200 mL premix       ? 1,000 mg ?200 mL/hr over 60 Minutes Intravenous Every 24 hours 08/14/21 1807    ? 08/14/21 2200  cefTRIAXone (ROCEPHIN) 2 g in sodium chloride 0.9 % 100 mL IVPB       ? 2 g ?200 mL/hr over 30 Minutes Intravenous Every 24 hours 08/14/21 1751 08/21/21 2159  ? 08/14/21 1930  metroNIDAZOLE (FLAGYL) IVPB 500 mg       ? 500 mg ?100 mL/hr over 60 Minutes Intravenous Every 12 hours 08/14/21 1751 08/21/21 1929  ? 08/14/21 1730  vancomycin (VANCOREADY) IVPB 2000 mg/400 mL       ? 2,000 mg ?200 mL/hr over 120 Minutes Intravenous  Once 08/14/21 1728  08/14/21 1957  ? 08/14/21 1715  piperacillin-tazobactam (ZOSYN) IVPB 3.375 g       ? 3.375 g ?100 mL/hr over 30 Minutes Intravenous  Once 08/14/21 1704 08/14/21 1740  ? 08/14/21 1615  piperacillin-tazobactam (ZOSYN) IVPB 3.375 g  Status:  Discontinued       ? 3.375 g ?100 mL/hr over 30 Minutes Intravenous  Once 08/14/21 1604 08/14/21 1704  ? ?  ? ? ? ?Objective: ?Vitals:  ? 08/14/21 2228 08/15/21 9381 08/15/21 0175 08/15/21 0954  ?BP: 125/65 127/73 (!) 169/76 (!) 159/77  ?Pulse: (!) 58 65 65 69   ?Resp: '16 18 17 17  '$ ?Temp: 98.3 ?F (36.8 ?C) 98.1 ?F (36.7 ?C) 98.1 ?F (36.7 ?C) 98.5 ?F (36.9 ?C)  ?TempSrc: Oral Oral Oral Oral  ?SpO2: 94% 94% 96% 100%  ?Weight:      ?Height:      ? ? ?Intake/Output Summary (Last 24 hours) at 08/15/2021 1011 ?Last data filed at 08/15/2021 0600 ?Gross per 24 hour  ?Intake 168.63 ml  ?Output 375 ml  ?Net -206.37 ml  ? ?Filed Weights  ? 08/14/21 1822  ?Weight: 98.8 kg  ? ? ?Examination:  ?General exam: Appears calm and comfortable  ?Respiratory system: Clear to auscultation. Respiratory effort normal. No respiratory distress. No conversational dyspnea.  ?Cardiovascular system: S1 & S2 heard, RRR. No murmurs.  ?Gastrointestinal system: Abdomen is nondistended, soft and nontender. Normal bowel sounds heard. ?Central nervous system: Alert and oriented. No focal neurological deficits. Speech clear.  ?Extremities: Symmetric in appearance  ?Skin: Right plantar foot wound present, right foot edema ?Psychiatry: Judgement and insight appear normal. Mood & affect appropriate.  ? ?Data Reviewed: I have personally reviewed following labs and imaging studies ? ?CBC: ?Recent Labs  ?Lab 08/14/21 ?1159 08/15/21 ?0322  ?WBC 14.4* 11.9*  ?NEUTROABS 11.3*  --   ?HGB 12.0* 11.0*  ?HCT 38.9* 36.0*  ?MCV 81.2 82.2  ?PLT 244 195  ? ?Basic Metabolic Panel: ?Recent Labs  ?Lab 08/14/21 ?1159 08/15/21 ?0322  ?NA 135 138  ?K 4.4 3.8  ?CL 104 107  ?CO2 25 25  ?GLUCOSE 85 121*  ?BUN 32* 32*  ?CREATININE 1.49* 1.46*  ?CALCIUM 9.5 9.2  ? ?GFR: ?Estimated Creatinine Clearance: 60.3 mL/min (A) (by C-G formula based on SCr of 1.46 mg/dL (H)). ?Liver Function Tests: ?No results for input(s): AST, ALT, ALKPHOS, BILITOT, PROT, ALBUMIN in the last 168 hours. ?No results for input(s): LIPASE, AMYLASE in the last 168 hours. ?No results for input(s): AMMONIA in the last 168 hours. ?Coagulation Profile: ?No results for input(s): INR, PROTIME in the last 168 hours. ?Cardiac Enzymes: ?No results for input(s): CKTOTAL, CKMB,  CKMBINDEX, TROPONINI in the last 168 hours. ?BNP (last 3 results) ?No results for input(s): PROBNP in the last 8760 hours. ?HbA1C: ?Recent Labs  ?  08/14/21 ?1842  ?HGBA1C 10.4*  ? ?CBG: ?Recent Labs  ?Lab 08/14/21 ?1817 08/14/21 ?2149 08/14/21 ?2300 08/15/21 ?0743  ?GLUCAP 92 78 80 105*  ? ?Lipid Profile: ?No results for input(s): CHOL, HDL, LDLCALC, TRIG, CHOLHDL, LDLDIRECT in the last 72 hours. ?Thyroid Function Tests: ?No results for input(s): TSH, T4TOTAL, FREET4, T3FREE, THYROIDAB in the last 72 hours. ?Anemia Panel: ?No results for input(s): VITAMINB12, FOLATE, FERRITIN, TIBC, IRON, RETICCTPCT in the last 72 hours. ?Sepsis Labs: ?No results for input(s): PROCALCITON, LATICACIDVEN in the last 168 hours. ? ?Recent Results (from the past 240 hour(s))  ?Culture, blood (routine x 2)     Status: None (Preliminary result)  ? Collection  Time: 08/14/21  4:45 PM  ? Specimen: BLOOD  ?Result Value Ref Range Status  ? Specimen Description   Final  ?  BLOOD BLOOD RIGHT WRIST ?Performed at [facility], [place] [address] [place], [place] [address] ?  ? Special Requests   Final  ?  BOTTLES DRAWN AEROBIC AND ANAEROBIC Blood Culture adequate volume ?Performed at [facility], [place] [address], [place], [place] [address] ?  ? Culture   Final  ?  NO GROWTH < 12 HOURS ?Performed at [facility], [place] [address], [place], [place] [address] ?  ? Report Status PENDING  Incomplete  ?Culture, blood (routine x 2)     Status: None (Preliminary result)  ? Collection Time: 08/14/21  4:50 PM  ? Specimen: BLOOD  ?Result Value Ref Range Status  ? Specimen Description   Final  ?  BLOOD RIGHT ANTECUBITAL ?Performed at [facility], [place] [address], [place], [place] [address] ?  ? Special Requests   Final  ?  BOTTLES DRAWN AEROBIC AND ANAEROBIC Blood Culture adequate volume ?Performed at [facility], [place] [address] [place], [place] [address] ?  ? Culture   Final  ?   NO GROWTH < 12 HOURS ?Performed at [facility], [place] [address], [place], [place] [address] ?  ? Report Status PENDING  Incomplete  ?  ? ? ?Radiology Studies: ?MR FOOT RIGHT WO CONTRAST ? ?Result Date:

## 2021-08-15 NOTE — Plan of Care (Signed)
?  Problem: Clinical Measurements: ?Goal: Ability to maintain clinical measurements within normal limits will improve ?08/15/2021 0743 by Yehuda Mao, RN ?Outcome: Progressing ?08/14/2021 1822 by Yehuda Mao, RN ?Outcome: Progressing ?  ?Problem: Activity: ?Goal: Risk for activity intolerance will decrease ?08/15/2021 0743 by Yehuda Mao, RN ?Outcome: Progressing ?08/14/2021 1822 by Yehuda Mao, RN ?Outcome: Progressing ?  ?Problem: Pain Managment: ?Goal: General experience of comfort will improve ?08/15/2021 0743 by Yehuda Mao, RN ?Outcome: Progressing ?08/14/2021 1822 by Yehuda Mao, RN ?Outcome: Progressing ?  ?Problem: Safety: ?Goal: Ability to remain free from injury will improve ?08/15/2021 0743 by Yehuda Mao, RN ?Outcome: Progressing ?08/14/2021 1822 by Yehuda Mao, RN ?Outcome: Progressing ?  ?

## 2021-08-15 NOTE — Anesthesia Postprocedure Evaluation (Signed)
Anesthesia Post Note ? ?Patient: Raymond [name] ? ?Procedure(s) Performed: IRRIGATION AND DEBRIDEMENT WOUND (Right) ? ?  ? ?Patient location during evaluation: PACU ?Anesthesia Type: General ?Level of consciousness: awake and alert, oriented and patient cooperative ?Pain management: pain level controlled ?Vital Signs Assessment: post-procedure vital signs reviewed and stable ?Respiratory status: spontaneous breathing, nonlabored ventilation and respiratory function stable ?Cardiovascular status: blood pressure returned to baseline and stable ?Postop Assessment: no apparent nausea or vomiting ?Anesthetic complications: no ? ? ?No notable events documented. ? ?Last Vitals:  ?Vitals:  ? 08/15/21 1930 08/15/21 1953  ?BP: 121/71 135/75  ?Pulse: 64 66  ?Resp: 19 18  ?Temp: 36.9 ?C 36.7 ?C  ?SpO2: 98% 98%  ?  ?Last Pain:  ?Vitals:  ? 08/15/21 1953  ?TempSrc: Oral  ?PainSc: 0-No pain  ? ? ?  ?  ?  ?  ?  ?  ? ?Raymond [name] ? ? ? ? ?

## 2021-08-15 NOTE — Brief Op Note (Signed)
08/15/2021 ? ?7:03 PM ? ?PATIENT:  Raymond [name]  61 y.o. male ? ?PRE-OPERATIVE DIAGNOSIS:  right foot abscess ? ?POST-OPERATIVE DIAGNOSIS:  right foot abscess ? ?PROCEDURE:  Procedure(s) with comments: ?IRRIGATION AND DEBRIDEMENT WOUND (Right) - need to make card not  a irrigation and debridement card ? ?SURGEON:  Surgeon(s) and Role: ?   Cannon Kettle, Lady Saucier, DPM - Primary ? ?PHYSICIAN ASSISTANT:  ? ?ASSISTANTS: none  ? ?ANESTHESIA:   LMA ? ?EBL:  400 mL  ? ?BLOOD ADMINISTERED:none ? ?DRAINS: none  ? ?LOCAL MEDICATIONS USED:  MARCAINE    and BUPIVICAINE  ? ?SPECIMEN:  Source of Specimen:  Right foot deep wound ? ?DISPOSITION OF SPECIMEN:   Micro ? ?COUNTS:  YES ? ?TOURNIQUET:   ?Total Tourniquet Time Documented: ?Ankle (Right) - 53 minutes ?Total: Ankle (Right) - 53 minutes ? ? ?DICTATION: .Note written in EPIC ? ?PLAN OF CARE:  Transfer back to medical floor ? ?PATIENT DISPOSITION:  PACU - hemodynamically stable. ?  ?Delay start of Pharmacological VTE agent (>24hrs) due to surgical blood loss or risk of bleeding: no ? ?

## 2021-08-16 ENCOUNTER — Encounter (HOSPITAL_COMMUNITY): Payer: Self-pay | Admitting: Sports Medicine

## 2021-08-16 LAB — CBC
HCT: 33 % — ABNORMAL LOW (ref 39.0–52.0)
Hemoglobin: 9.9 g/dL — ABNORMAL LOW (ref 13.0–17.0)
MCH: 25 pg — ABNORMAL LOW (ref 26.0–34.0)
MCHC: 30 g/dL (ref 30.0–36.0)
MCV: 83.3 fL (ref 80.0–100.0)
Platelets: 208 10*3/uL (ref 150–400)
RBC: 3.96 MIL/uL — ABNORMAL LOW (ref 4.22–5.81)
RDW: 14.5 % (ref 11.5–15.5)
WBC: 11.7 10*3/uL — ABNORMAL HIGH (ref 4.0–10.5)
nRBC: 0 % (ref 0.0–0.2)

## 2021-08-16 LAB — BASIC METABOLIC PANEL
Anion gap: 7 (ref 5–15)
BUN: 38 mg/dL — ABNORMAL HIGH (ref 6–20)
CO2: 23 mmol/L (ref 22–32)
Calcium: 8.8 mg/dL — ABNORMAL LOW (ref 8.9–10.3)
Chloride: 107 mmol/L (ref 98–111)
Creatinine, Ser: 1.41 mg/dL — ABNORMAL HIGH (ref 0.61–1.24)
GFR, Estimated: 57 mL/min — ABNORMAL LOW (ref >60)
Glucose, Bld: 159 mg/dL — ABNORMAL HIGH (ref 70–99)
Potassium: 4.2 mmol/L (ref 3.5–5.1)
Sodium: 137 mmol/L (ref 135–145)

## 2021-08-16 LAB — GLUCOSE, CAPILLARY
Glucose-Capillary: 92 mg/dL (ref 70–99)
Glucose-Capillary: 136 mg/dL — ABNORMAL HIGH (ref 70–99)
Glucose-Capillary: 165 mg/dL — ABNORMAL HIGH (ref 70–99)
Glucose-Capillary: 202 mg/dL — ABNORMAL HIGH (ref 70–99)
Glucose-Capillary: 201 mg/dL — ABNORMAL HIGH (ref 70–99)
Glucose-Capillary: 190 mg/dL — ABNORMAL HIGH (ref 70–99)

## 2021-08-16 MED ORDER — LOPERAMIDE HCL 2 MG PO CAPS
2.0000 mg | ORAL_CAPSULE | ORAL | Status: DC | PRN
  Administered 2021-08-16 – 2021-08-18 (×4): 2 mg via ORAL
  Filled 2021-08-16 (×4): qty 1

## 2021-08-16 MED ORDER — AMPICILLIN-SULBACTAM SODIUM 3 (2-1) G IJ SOLR
3.0000 g | Freq: Four times a day (QID) | INTRAMUSCULAR | Status: DC
  Administered 2021-08-16 – 2021-08-18 (×8): 3 g via INTRAVENOUS
  Filled 2021-08-16 (×10): qty 8

## 2021-08-16 MED ORDER — LIP MEDEX EX OINT
TOPICAL_OINTMENT | CUTANEOUS | Status: DC | PRN
  Filled 2021-08-16: qty 7

## 2021-08-16 NOTE — Progress Notes (Signed)
Pharmacy Antibiotic Note ? ?Raymond [name] is a 61 y.o. male with hx kidney transplant, DM and chronic diabetic foot wound/ulcer presented to the ED on  08/14/2021 with c/o right foot swelling. Right foot MRI on 08/14/21 showed no evidence of osteomyelitis but had findings with suspicion for "localized fluid/edema or potentially developing abscesses in the setting of a soft tissue infection." Patient underwent surgical I&D on 3/23 and cultures were sent.  Pharmacy has been consulted to narrow antibiotics to Unasyn for diabetic foot infection.  ? ?Plan: ?Start Unasyn 3g IV q6 hours ?F/u culture data and ability to further narrow abx ?Discontinue vancomycin, ceftriaxone, and metronidazole ? ?Height: '5\' 7"'$  (170.2 cm) ?Weight: 98.8 kg (217 lb 13 oz) ?IBW/kg (Calculated) : 66.1 ?_________________________________________ ?Temp (24hrs), Avg:98.5 ?F (36.9 ?C), Min:98 ?F (36.7 ?C), Max:99 ?F (37.2 ?C) ? ?Recent Labs  ?Lab 08/14/21 ?1159 08/15/21 ?0322 08/16/21 ?0308  ?WBC 14.4* 11.9* 11.7*  ?CREATININE 1.49* 1.46* 1.41*  ? ?  ?Estimated Creatinine Clearance: 62.4 mL/min (A) (by C-G formula based on SCr of 1.41 mg/dL (H)).   ? ?No Known Allergies ? ?Antimicrobials this admission:  ?3/22 Zosyn x 1 ?3/22 Vanc >>3/24 ?3/22 CTX >>3/24 ?3/22 Flagyl >>3/24 ?3/24 Unasyn >> ? ?Dose adjustments this admission:  ?N/A ? ?Microbiology results:  ?3/22 BCx: ngtd  ?3/23 Wound Cx: Strep. agalactiae ? ?Thank you for allowing pharmacy to be a part of this patient?s care. ? ?Dimple Nanas, PharmD ?08/16/2021 1:20 PM ? ? ?

## 2021-08-16 NOTE — Progress Notes (Signed)
Subjective: ?Raymond [name] is a 61 y.o. male patient seen at bedside, resting comfortably in no acute distress s/p day #1 Right irrigation debridement plantar foot wound. Patient admits a little pain at surgical site, denies calf pain, denies headache, chest pain, shortness of breath, nausea, vomitting, denies loss of appetite, denies problems with voiding. No other issues noted.  ? ?Patient Active Problem List  ? Diagnosis Date Noted  ? Diabetic foot ulcer ([place]) 08/14/2021  ? CKD (chronic kidney disease) stage 3, GFR 30-59 ml/min (HCC) 08/14/2021  ? Hypomagnesemia 06/25/2021  ? Septic arthritis of elbow, left ([place]) 06/24/2021  ? Peripheral vascular disease ([place]) 04/24/2020  ? Pyogenic inflammation of bone (HCC)   ? Thrombophlebitis of superficial veins of right lower extremity   ? Difficult intravenous access   ? MRSA bacteremia   ? Diabetic foot infection ([name])   ? Infected wound 01/12/2020  ? Critical lower limb ischemia ([place]) 02/02/2019  ? Personal history of noncompliance with medical treatment, presenting hazards to health 10/07/2018  ? Critical limb ischemia with history of revascularization of same extremity ([place]) 07/27/2018  ? Mixed hyperlipidemia 02/19/2018  ? History of renal transplant 02/19/2018  ? Conductive hearing loss, bilateral 08/31/2017  ? Nuclear sclerotic cataract of right eye 10/07/2016  ? Pseudophakia of left eye 10/07/2016  ? Hypertension due to endocrine disorder 03/04/2016  ? 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 Renal failure 03/04/2016  ? Proliferative diabetic retinopathy with macular edema associated with type 2 diabetes mellitus ([place]) 03/04/2016  ? Immunosuppression ([place]) 12/10/2015  ? Nausea vomiting and diarrhea 12/10/2015  ? Type 2 diabetes mellitus ([place]) 12/10/2015  ? Essential hypertension, benign 12/10/2015  ? Pancytopenia ([place]) 12/10/2015  ? Encounter for screening colonoscopy 11/15/2014   ? GERD (gastroesophageal reflux disease) 11/15/2014  ? ? ? ?Current Facility-Administered Medications:  ?  0.9 %  sodium chloride infusion, , Intravenous, Continuous, Dessa Phi, DO, Last Rate: 20 mL/hr at 08/15/21 2012, New Bag at 08/15/21 2012 ?  0.9 %  sodium chloride infusion, 250 mL, Intravenous, PRN, Dessa Phi, DO ?  acetaminophen (TYLENOL) tablet 650 mg, 650 mg, Oral, Q6H PRN **OR** acetaminophen (TYLENOL) suppository 650 mg, 650 mg, Rectal, Q6H PRN, Dessa Phi, DO ?  Ampicillin-Sulbactam (UNASYN) 3 g in sodium chloride 0.9 % 100 mL IVPB, 3 g, Intravenous, Q6H, Dimple Nanas, RPH, Last Rate: 200 mL/hr at 08/16/21 1407, 3 g at 08/16/21 1407 ?  ascorbic acid (VITAMIN C) tablet 250 mg, 250 mg, Oral, BID, Dessa Phi, DO, 250 mg at 08/16/21 4970 ?  atorvastatin (LIPITOR) tablet 10 mg, 10 mg, Oral, Daily, Dessa Phi, DO, 10 mg at 08/16/21 2637 ?  brimonidine (ALPHAGAN) 0.2 % ophthalmic solution 1 drop, 1 drop, Both Eyes, TID, Dessa Phi, DO, 1 drop at 08/16/21 1731 ?  [START ON 08/18/2021] cloNIDine (CATAPRES - Dosed in mg/24 hr) patch 0.3 mg, 0.3 mg, Transdermal, Weekly, Dessa Phi, DO ?  dorzolamide-timolol (COSOPT) 22.3-6.8 MG/ML ophthalmic solution 1 drop, 1 drop, Both Eyes, BID, Dessa Phi, DO, 1 drop at 08/16/21 8588 ?  doxazosin (CARDURA) tablet 4 mg, 4 mg, Oral, Daily, Dessa Phi, DO, 4 mg at 08/16/21 5027 ?  heparin injection 5,000 Units, 5,000 Units, Subcutaneous, Q8H, Dessa Phi, DO, 5,000 Units at 08/16/21 1402 ?  hydrALAZINE (APRESOLINE) injection 5 mg, 5 mg, Intravenous, Q4H PRN, Dessa Phi, DO ?  hydrALAZINE (APRESOLINE) tablet 25 mg, 25 mg, Oral, BID, Dessa Phi, DO, 25  mg at 08/16/21 0349 ?  HYDROcodone-acetaminophen (NORCO/VICODIN) 5-325 MG per tablet 1-2 tablet, 1-2 tablet, Oral, Q4H PRN, Dessa Phi, DO, 2 tablet at 08/15/21 1336 ?  insulin aspart (novoLOG) injection 0-15 Units, 0-15 Units, Subcutaneous, TID WC, Dessa Phi, DO, 5  Units at 08/16/21 1730 ?  insulin glargine-yfgn (SEMGLEE) injection 15 Units, 15 Units, Subcutaneous, QHS, Dessa Phi, DO, 15 Units at 08/15/21 2223 ?  latanoprost (XALATAN) 0.005 % ophthalmic solution 1 drop, 1 drop, Both Eyes, QHS, Dessa Phi, DO, 1 drop at 08/15/21 2221 ?  lip balm (CARMEX) ointment, , Topical, PRN, Dessa Phi, DO ?  loperamide (IMODIUM) capsule 2 mg, 2 mg, Oral, PRN, Dessa Phi, DO, 2 mg at 08/16/21 1403 ?  multivitamin with minerals tablet 1 tablet, 1 tablet, Oral, Daily, Dessa Phi, DO, 1 tablet at 08/16/21 1791 ?  nutrition supplement (JUVEN) (JUVEN) powder packet 1 packet, 1 packet, Oral, BID BM, Dessa Phi, DO, 1 packet at 08/16/21 0813 ?  ondansetron (ZOFRAN) tablet 4 mg, 4 mg, Oral, Q6H PRN **OR** ondansetron (ZOFRAN) injection 4 mg, 4 mg, Intravenous, Q6H PRN, Dessa Phi, DO ?  polyethylene glycol (MIRALAX / GLYCOLAX) packet 17 g, 17 g, Oral, Daily PRN, Dessa Phi, DO ?  protein supplement (ENSURE MAX) liquid, 11 oz, Oral, Daily, Dessa Phi, DO, 11 oz at 08/16/21 1226 ?  sodium bicarbonate tablet 1,300 mg, 1,300 mg, Oral, TID, Dessa Phi, DO, 1,300 mg at 08/16/21 1730 ?  sodium chloride flush (NS) 0.9 % injection 3 mL, 3 mL, Intravenous, Q12H, Dessa Phi, DO, 3 mL at 08/15/21 0815 ?  sodium chloride flush (NS) 0.9 % injection 3 mL, 3 mL, Intravenous, PRN, Dessa Phi, DO ?  tacrolimus (PROGRAF) capsule 3 mg, 3 mg, Oral, Daily, Dessa Phi, DO, 3 mg at 08/16/21 5056 ?  tacrolimus (PROGRAF) capsule 4 mg, 4 mg, Oral, QHS, Dessa Phi, DO, 4 mg at 08/15/21 2218 ?  zinc sulfate capsule 220 mg, 220 mg, Oral, Daily, Dessa Phi, DO, 220 mg at 08/16/21 9794 ? ?No Known Allergies ? ? ?Objective: ?Today's Vitals  ? 08/16/21 0124 08/16/21 0519 08/16/21 0800 08/16/21 1358  ?BP: (!) 145/75 (!) 154/59  (!) 139/56  ?Pulse: 73 72  63  ?Resp: '18 19  18  '$ ?Temp: 99 ?F (37.2 ?C) 98.2 ?F (36.8 ?C)    ?TempSrc: Oral Oral    ?SpO2: 94% 91%  97%  ?Weight:       ?Height:      ?PainSc:   0-No pain   ? ? ?General: No acute distress ? ?Right Lower extremity: Dressing to Right foot clean, dry, intact. No strikethrough noted, Upon removal of dressings, plantar wound to the right foot that measures 7 cm in length by 3 cm in width with bloody base tendon and deep soft tissue exposed and Surgicel foam in the wound bed.  Localized edema and erythema, no active purulence noted, mild pain with palpation to the right foot.  Range of motion excluding surgical site within normal limits with no pain or crepitation.   ? ? ? ? ? ?Assessment and Plan:  ?Problem List Items Addressed This Visit   ? ?  ? Endocrine  ? * (Principal) Diabetic foot ulcer ([place]) - Primary  ? Relevant Medications  ? atorvastatin (LIPITOR) 10 MG tablet  ? insulin glargine-yfgn (SEMGLEE) injection 15 Units  ? insulin aspart (novoLOG) injection 0-15 Units  ? atorvastatin (LIPITOR) tablet 10 mg  ? ? ?-Patient seen and evaluated at bedside ?-Chart reviewed ?-Discussed  with patient extent of his infection during the surgery that showed purulent drainage tract in between the flexor tendons and in the proximal mid arch ?-Dressing change preformed; applied surgery following and packing with dressing to right foot ?-Advised patient to make sure to keep dressing clean, dry, and intact to Right foot allowing nursing to change dressings as ordered if there is strike through  ?-Ordered wound VAC to be placed on patient bedside hopefully on tomorrow ?-Awaiting intra-op cultures; continue with IV antibiotics until pre-limb cultures have resulted  ?-Weightbearing to heel with post op shoe for bedside and bathroom only with assistive device; recommend PT to work with patient prior to discharge ?-Continue with rest and elevation to assist with pain and edema control  ?-Case status discussed with his vascular doctor Dr. Gwenlyn Found at this time we will closely monitor and if there is any vascular needs we will reconsult Dr.  Gwenlyn Found ?-Podiatry will continue to follow closely  ?-Anticipated discharge plan of care: To home with antibiotics and home nursing for wound VAC dressing change.  To place wound VAC on patient tomorrow prior to dischar

## 2021-08-16 NOTE — Plan of Care (Signed)
?  Problem: Clinical Measurements: ?Goal: Ability to maintain clinical measurements within normal limits will improve ?Outcome: Progressing ?  ?Problem: Activity: ?Goal: Risk for activity intolerance will decrease ?Outcome: Progressing ?  ?Problem: Nutrition: ?Goal: Adequate nutrition will be maintained ?Outcome: Progressing ?  ?Problem: Pain Managment: ?Goal: General experience of comfort will improve ?Outcome: Progressing ?  ?

## 2021-08-16 NOTE — Plan of Care (Signed)

## 2021-08-16 NOTE — Progress Notes (Signed)
Pt. Complains of weakness and dizziness, Vitals stable, CBG checked 202. Pt. Is also complaining of loose stools and sates that he has Lactose Intolerance and is requesting Imodium. Provider notified and new orders carried out. ?

## 2021-08-16 NOTE — Progress Notes (Signed)
?PROGRESS NOTE ? ? ? ?Raymond [name]  CHY:[id number] DOB: Dec 27, 1960 DOA: 08/14/2021 ?PCP: [name], FNP  ? ?  ?Brief Narrative:  ?Raymond [name] is a 61 y.o. male with medical history significant of DM, HTN, CKD who presents with acute on chronic diabetic foot wound on the plantar surface of his right foot.  He states that this has been ongoing for about 6 months.  He had followed up with Dr. Jacqualyn Posey of podiatry, infectious disease, outpatient wound clinic, as well as Dr. Gwenlyn Found of cardiology.  He states that nothing has really helped throughout all of this.  2 days ago, he noticed significant right foot swelling.  He did also have one-time episode of nausea and vomiting.  Admits to chills without any documented fevers.  No chest pain, shortness of breath or significant cough.  No recent exposure to COVID-positive patients. WBC 14.4, creatinine 1.49.  MRI of the foot showed extensive soft tissue swelling of the foot with small pockets of fluid without osteomyelitis. Patient admitted and started on IV antibiotics.  Podiatry consulted.  Patient underwent incision and drainage with debridement and wound culture with Dr. Cannon Kettle on 3/23. ? ?New events last 24 hours / Subjective: ?He has no complaints today, doing well overall.  Remains afebrile. ? ?Assessment & Plan: ?  ?Principal Problem: ?  Diabetic foot ulcer ([place]) ?Active Problems: ?  Type 2 diabetes mellitus ([place]) ?  Essential hypertension, benign ?  Mixed hyperlipidemia ?  History of renal transplant ?  CKD (chronic kidney disease) stage 3, GFR 30-59 ml/min (HCC) ? ? ?Nonhealing diabetic right foot wound  ?-MRI did not reveal osteomyelitis, but did see extensive soft tissue swelling of the foot with small pockets of fluid within the deep intrinsic muscular compartment representing localized fluid/edema or developing abscesses ?-Has followed with Dr. Jacqualyn Posey in the past (last seen Nov 2022) ?-Followed by Dr. Gwenlyn Found for history of critical limb ischemia.  Was  most recently seen in July 25, 2021 regarding his poorly healing wounds and was determined to not require any invasive peripheral vascular procedure.  Patient to follow up with Dr. Gwenlyn Found on discharge. ?-S/p incision and drainage with debridement and wound culture with Dr. Cannon Kettle on 3/23 ?-Surgical wound culture is pending.  Remains on vancomycin, Rocephin, Flagyl ? ? DM ?-Semglee, SSI ?  ?CKD stage 3a s/p renal transplant  ?-Baseline Cr ~1.1-1.4  ?-Continue tacrolimus, sodium bicarb  ?-Stable  ?  ?HTN ?-Catapres patch, hydralazine, doxazosin  ?  ?HLD ?-Lipitor  ? ? ?DVT prophylaxis:  ?heparin injection 5,000 Units Start: 08/14/21 2200 ? ?Code Status: Full code ?Family Communication: No family at bedside ?Disposition Plan:  ?Status is: Inpatient ?Remains inpatient appropriate because: IV antibiotics, waiting intraop cultures  ? ?Antimicrobials:  ?Anti-infectives (From admission, onward)  ? ? Start     Dose/Rate Route Frequency Ordered Stop  ? 08/15/21 1800  vancomycin (VANCOCIN) IVPB 1000 mg/200 mL premix       ? 1,000 mg ?200 mL/hr over 60 Minutes Intravenous Every 24 hours 08/14/21 1807    ? 08/14/21 2200  cefTRIAXone (ROCEPHIN) 2 g in sodium chloride 0.9 % 100 mL IVPB       ? 2 g ?200 mL/hr over 30 Minutes Intravenous Every 24 hours 08/14/21 1751 08/21/21 2159  ? 08/14/21 1930  metroNIDAZOLE (FLAGYL) IVPB 500 mg       ? 500 mg ?100 mL/hr over 60 Minutes Intravenous Every 12 hours 08/14/21 1751 08/21/21 2159  ? 08/14/21 1730  vancomycin (VANCOREADY) IVPB  2000 mg/400 mL       ? 2,000 mg ?200 mL/hr over 120 Minutes Intravenous  Once 08/14/21 1728 08/14/21 1957  ? 08/14/21 1715  piperacillin-tazobactam (ZOSYN) IVPB 3.375 g       ? 3.375 g ?100 mL/hr over 30 Minutes Intravenous  Once 08/14/21 1704 08/14/21 1740  ? 08/14/21 1615  piperacillin-tazobactam (ZOSYN) IVPB 3.375 g  Status:  Discontinued       ? 3.375 g ?100 mL/hr over 30 Minutes Intravenous  Once 08/14/21 1604 08/14/21 1704  ? ?  ? ? ? ?Objective: ?Vitals:  ?  08/15/21 1953 08/15/21 2159 08/16/21 0124 08/16/21 0519  ?BP: 135/75 124/78 (!) 145/75 (!) 154/59  ?Pulse: 66 69 73 72  ?Resp: '18 17 18 19  '$ ?Temp: 98 ?F (36.7 ?C) 98.2 ?F (36.8 ?C) 99 ?F (37.2 ?C) 98.2 ?F (36.8 ?C)  ?TempSrc: Oral Oral Oral Oral  ?SpO2: 98% 92% 94% 91%  ?Weight:      ?Height:      ? ? ?Intake/Output Summary (Last 24 hours) at 08/16/2021 1018 ?Last data filed at 08/16/2021 0600 ?Gross per 24 hour  ?Intake 2246.02 ml  ?Output 700 ml  ?Net 1546.02 ml  ? ? ?Filed Weights  ? 08/14/21 1822  ?Weight: 98.8 kg  ? ? ?Examination:  ?General exam: Appears calm and comfortable  ?Respiratory system: Clear to auscultation. Respiratory effort normal. No respiratory distress. No conversational dyspnea.  ?Cardiovascular system: S1 & S2 heard, RRR. No murmurs.  ?Gastrointestinal system: Abdomen is nondistended, soft and nontender. Normal bowel sounds heard. ?Central nervous system: Alert and oriented. No focal neurological deficits. Speech clear.  ?Extremities: Symmetric in appearance  ?Skin: Right foot wrapped in dressing  ?Psychiatry: Judgement and insight appear normal. Mood & affect appropriate.  ? ?Data Reviewed: I have personally reviewed following labs and imaging studies ? ?CBC: ?Recent Labs  ?Lab 08/14/21 ?1159 08/15/21 ?0322 08/16/21 ?0308  ?WBC 14.4* 11.9* 11.7*  ?NEUTROABS 11.3*  --   --   ?HGB 12.0* 11.0* 9.9*  ?HCT 38.9* 36.0* 33.0*  ?MCV 81.2 82.2 83.3  ?PLT 244 195 208  ? ? ?Basic Metabolic Panel: ?Recent Labs  ?Lab 08/14/21 ?1159 08/15/21 ?0322 08/16/21 ?0308  ?NA 135 138 137  ?K 4.4 3.8 4.2  ?CL 104 107 107  ?CO2 '25 25 23  '$ ?GLUCOSE 85 121* 159*  ?BUN 32* 32* 38*  ?CREATININE 1.49* 1.46* 1.41*  ?CALCIUM 9.5 9.2 8.8*  ? ? ?GFR: ?Estimated Creatinine Clearance: 62.4 mL/min (A) (by C-G formula based on SCr of 1.41 mg/dL (H)). ?Liver Function Tests: ?No results for input(s): AST, ALT, ALKPHOS, BILITOT, PROT, ALBUMIN in the last 168 hours. ?No results for input(s): LIPASE, AMYLASE in the last 168 hours. ?No  results for input(s): AMMONIA in the last 168 hours. ?Coagulation Profile: ?No results for input(s): INR, PROTIME in the last 168 hours. ?Cardiac Enzymes: ?No results for input(s): CKTOTAL, CKMB, CKMBINDEX, TROPONINI in the last 168 hours. ?BNP (last 3 results) ?No results for input(s): PROBNP in the last 8760 hours. ?HbA1C: ?Recent Labs  ?  08/14/21 ?1842  ?HGBA1C 10.4*  ? ? ?CBG: ?Recent Labs  ?Lab 08/15/21 ?1717 08/15/21 ?1857 08/15/21 ?2201 08/16/21 ?0938 08/16/21 ?0737  ?GLUCAP 92 104* 138* 136* 165*  ? ? ?Lipid Profile: ?No results for input(s): CHOL, HDL, LDLCALC, TRIG, CHOLHDL, LDLDIRECT in the last 72 hours. ?Thyroid Function Tests: ?No results for input(s): TSH, T4TOTAL, FREET4, T3FREE, THYROIDAB in the last 72 hours. ?Anemia Panel: ?No results for input(s): VITAMINB12, FOLATE,  FERRITIN, TIBC, IRON, RETICCTPCT in the last 72 hours. ?Sepsis Labs: ?No results for input(s): PROCALCITON, LATICACIDVEN in the last 168 hours. ? ?Recent Results (from the past 240 hour(s))  ?Culture, blood (routine x 2)     Status: None (Preliminary result)  ? Collection Time: 08/14/21  4:45 PM  ? Specimen: BLOOD  ?Result Value Ref Range Status  ? Specimen Description   Final  ?  BLOOD BLOOD RIGHT WRIST ?Performed at [facility], [place] [address] [place], [place] [address] ?  ? Special Requests   Final  ?  BOTTLES DRAWN AEROBIC AND ANAEROBIC Blood Culture adequate volume ?Performed at [facility], [place] [address], [place], [place] [address] ?  ? Culture   Final  ?  NO GROWTH 2 DAYS ?Performed at [facility], [place] [address] [place], [place] [address] ?  ? Report Status PENDING  Incomplete  ?Culture, blood (routine x 2)     Status: None (Preliminary result)  ? Collection Time: 08/14/21  4:50 PM  ? Specimen: BLOOD  ?Result Value Ref Range Status  ? Specimen Description   Final  ?  BLOOD RIGHT ANTECUBITAL ?Performed at [facility], [place] [address], [place], [place]  [address] ?  ? Special Requests   Final  ?  BOTTLES DRAWN AEROBIC AND ANAEROBIC Blood Culture adequate volume ?Performed at [facility], [place] [address], [place], [place] [address] ?  ? Cult

## 2021-08-17 LAB — GLUCOSE, CAPILLARY
Glucose-Capillary: 228 mg/dL — ABNORMAL HIGH (ref 70–99)
Glucose-Capillary: 276 mg/dL — ABNORMAL HIGH (ref 70–99)
Glucose-Capillary: 347 mg/dL — ABNORMAL HIGH (ref 70–99)
Glucose-Capillary: 248 mg/dL — ABNORMAL HIGH (ref 70–99)

## 2021-08-17 NOTE — Progress Notes (Signed)
Subjective: ?Raymond [name] is a 61 y.o. male patient seen at bedside, resting comfortably in no acute distress s/p day #2 Right irrigation debridement plantar foot wound. Patient reports that pain is better and states that he still feels a little dizzy but it is improving, denies any other constitutional symptoms at this time. ? ?Patient Active Problem List  ? Diagnosis Date Noted  ? Diabetic foot ulcer ([place]) 08/14/2021  ? CKD (chronic kidney disease) stage 3, GFR 30-59 ml/min (HCC) 08/14/2021  ? Hypomagnesemia 06/25/2021  ? Septic arthritis of elbow, left ([place]) 06/24/2021  ? Peripheral vascular disease ([place]) 04/24/2020  ? Pyogenic inflammation of bone (HCC)   ? Thrombophlebitis of superficial veins of right lower extremity   ? Difficult intravenous access   ? MRSA bacteremia   ? Diabetic foot infection ([place])   ? Infected wound 01/12/2020  ? Critical lower limb ischemia ([place]) 02/02/2019  ? Personal history of noncompliance with medical treatment, presenting hazards to health 10/07/2018  ? Critical limb ischemia with history of revascularization of same extremity ([place]) 07/27/2018  ? Mixed hyperlipidemia 02/19/2018  ? History of renal transplant 02/19/2018  ? Conductive hearing loss, bilateral 08/31/2017  ? Nuclear sclerotic cataract of right eye 10/07/2016  ? Pseudophakia of left eye 10/07/2016  ? Hypertension due to endocrine disorder 03/04/2016  ? 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 Renal failure 03/04/2016  ? Proliferative diabetic retinopathy with macular edema associated with type 2 diabetes mellitus ([place]) 03/04/2016  ? Immunosuppression ([place]) 12/10/2015  ? Nausea vomiting and diarrhea 12/10/2015  ? Type 2 diabetes mellitus ([place]) 12/10/2015  ? Essential hypertension, benign 12/10/2015  ? Pancytopenia ([place]) 12/10/2015  ? Encounter for screening colonoscopy 11/15/2014  ? GERD (gastroesophageal reflux disease) 11/15/2014   ? ? ? ?Current Facility-Administered Medications:  ?  0.9 %  sodium chloride infusion, , Intravenous, Continuous, Dessa Phi, DO, Last Rate: 20 mL/hr at 08/15/21 2012, New Bag at 08/15/21 2012 ?  0.9 %  sodium chloride infusion, 250 mL, Intravenous, PRN, Dessa Phi, DO ?  acetaminophen (TYLENOL) tablet 650 mg, 650 mg, Oral, Q6H PRN **OR** acetaminophen (TYLENOL) suppository 650 mg, 650 mg, Rectal, Q6H PRN, Dessa Phi, DO ?  Ampicillin-Sulbactam (UNASYN) 3 g in sodium chloride 0.9 % 100 mL IVPB, 3 g, Intravenous, Q6H, Dimple Nanas, RPH, Last Rate: 200 mL/hr at 08/17/21 1334, 3 g at 08/17/21 1334 ?  ascorbic acid (VITAMIN C) tablet 250 mg, 250 mg, Oral, BID, Dessa Phi, DO, 250 mg at 08/17/21 7829 ?  atorvastatin (LIPITOR) tablet 10 mg, 10 mg, Oral, Daily, Dessa Phi, DO, 10 mg at 08/17/21 5621 ?  brimonidine (ALPHAGAN) 0.2 % ophthalmic solution 1 drop, 1 drop, Both Eyes, TID, Dessa Phi, DO, 1 drop at 08/17/21 0914 ?  [START ON 08/18/2021] cloNIDine (CATAPRES - Dosed in mg/24 hr) patch 0.3 mg, 0.3 mg, Transdermal, Weekly, Dessa Phi, DO ?  dorzolamide-timolol (COSOPT) 22.3-6.8 MG/ML ophthalmic solution 1 drop, 1 drop, Both Eyes, BID, Dessa Phi, DO, 1 drop at 08/17/21 0915 ?  doxazosin (CARDURA) tablet 4 mg, 4 mg, Oral, Daily, Dessa Phi, DO, 4 mg at 08/17/21 3086 ?  heparin injection 5,000 Units, 5,000 Units, Subcutaneous, Q8H, Dessa Phi, DO, 5,000 Units at 08/17/21 1326 ?  hydrALAZINE (APRESOLINE) injection 5 mg, 5 mg, Intravenous, Q4H PRN, Dessa Phi, DO, 5 mg at 08/17/21 5784 ?  hydrALAZINE (APRESOLINE) tablet 25 mg, 25 mg, Oral, BID, Dessa Phi, DO, 25 mg  at 08/17/21 9323 ?  HYDROcodone-acetaminophen (NORCO/VICODIN) 5-325 MG per tablet 1-2 tablet, 1-2 tablet, Oral, Q4H PRN, Dessa Phi, DO, 2 tablet at 08/17/21 0617 ?  insulin aspart (novoLOG) injection 0-15 Units, 0-15 Units, Subcutaneous, TID WC, Dessa Phi, DO, 8 Units at 08/17/21 1325 ?  insulin  glargine-yfgn (SEMGLEE) injection 15 Units, 15 Units, Subcutaneous, QHS, Dessa Phi, DO, 15 Units at 08/16/21 2207 ?  latanoprost (XALATAN) 0.005 % ophthalmic solution 1 drop, 1 drop, Both Eyes, QHS, Dessa Phi, DO, 1 drop at 08/16/21 2204 ?  lip balm (CARMEX) ointment, , Topical, PRN, Dessa Phi, DO, Given at 08/16/21 1825 ?  loperamide (IMODIUM) capsule 2 mg, 2 mg, Oral, PRN, Dessa Phi, DO, 2 mg at 08/17/21 0154 ?  multivitamin with minerals tablet 1 tablet, 1 tablet, Oral, Daily, Dessa Phi, DO, 1 tablet at 08/17/21 0903 ?  nutrition supplement (JUVEN) (JUVEN) powder packet 1 packet, 1 packet, Oral, BID BM, Dessa Phi, DO, 1 packet at 08/17/21 5573 ?  ondansetron (ZOFRAN) tablet 4 mg, 4 mg, Oral, Q6H PRN **OR** ondansetron (ZOFRAN) injection 4 mg, 4 mg, Intravenous, Q6H PRN, Dessa Phi, DO ?  polyethylene glycol (MIRALAX / GLYCOLAX) packet 17 g, 17 g, Oral, Daily PRN, Dessa Phi, DO ?  protein supplement (ENSURE MAX) liquid, 11 oz, Oral, Daily, Dessa Phi, DO, 11 oz at 08/17/21 1331 ?  sodium bicarbonate tablet 1,300 mg, 1,300 mg, Oral, TID, Dessa Phi, DO, 1,300 mg at 08/17/21 2202 ?  sodium chloride flush (NS) 0.9 % injection 3 mL, 3 mL, Intravenous, Q12H, Dessa Phi, DO, 3 mL at 08/17/21 0904 ?  sodium chloride flush (NS) 0.9 % injection 3 mL, 3 mL, Intravenous, PRN, Dessa Phi, DO ?  tacrolimus (PROGRAF) capsule 3 mg, 3 mg, Oral, Daily, Dessa Phi, DO, 3 mg at 08/17/21 5427 ?  tacrolimus (PROGRAF) capsule 4 mg, 4 mg, Oral, QHS, Dessa Phi, DO, 4 mg at 08/16/21 2201 ?  zinc sulfate capsule 220 mg, 220 mg, Oral, Daily, Dessa Phi, DO, 220 mg at 08/17/21 0623 ? ?No Known Allergies ? ? ?Objective: ?Today's Vitals  ? 08/17/21 0609 08/17/21 0715 08/17/21 0853 08/17/21 1310  ?BP: (!) 193/93   140/69  ?Pulse: 71   61  ?Resp: 18   16  ?Temp: 98.2 ?F (36.8 ?C)   98.1 ?F (36.7 ?C)  ?TempSrc: Oral     ?SpO2: 98%   94%  ?Weight:      ?Height:      ?PainSc: 6   Asleep 1    ? ? ?General: No acute distress ? ?Right Lower extremity: Dressing to Right foot clean, dry, intact. No strikethrough noted, Upon removal of dressings, plantar wound to the right foot that measures 7 cm in length by 3 cm in width with bloody base tendon and deep soft tissue exposed and Surgicel foam in the wound bed as previous.  Localized edema and erythema, no active purulence noted, mild pain with palpation to the right foot.  Range of motion excluding surgical site within normal limits with no pain or crepitation.   ? ? ?Assessment and Plan:  ?Problem List Items Addressed This Visit   ? ?  ? Endocrine  ? * (Principal) Diabetic foot ulcer ([place]) - Primary  ? Relevant Medications  ? atorvastatin (LIPITOR) 10 MG tablet  ? insulin glargine-yfgn (SEMGLEE) injection 15 Units  ? insulin aspart (novoLOG) injection 0-15 Units  ? atorvastatin (LIPITOR) tablet 10 mg  ? ? ?-Patient seen and evaluated at bedside ?-Chart reviewed ?-  Dressing change performed; Surgifoam left in place and redressed with clean dry dressing to right foot ?-Advised patient to make sure to keep dressing clean, dry, and intact to Right foot allowing nursing to change dressings as ordered if there is strike through  ?-Ordered wound VAC to be placed on patient bedside hopefully on tomorrow if the wound VAC is not present on tomorrow patient can still be discharged home and hopefully we can have the wound VAC delivered to his home outpatient for [place] to apply when he comes to his first postoperative check in office ?-Awaiting intra-op cultures; continue with IV antibiotics until pre-limb cultures have resulted so far showing strep ?-Weightbearing to heel with post op shoe for bedside and bathroom only with assistive device; orders placed for physical therapy to work with patient prior to discharge ?-Continue with rest and elevation to assist with pain and edema control  ?-At this time from a vascular standpoint patient seems to be doing okay  with no acute needs if there is any vascular needs we will reconsult Dr. Gwenlyn Found which can be done outpatient ?-Podiatry will continue to follow closely  ?-Anticipated discharge plan of care: To home with an

## 2021-08-17 NOTE — TOC Progression Note (Signed)
Transition of Care (TOC) - Progression Note  ? ? ?Patient Details  ?Name: Raymond [name] ?MRN: [id number] ?Date of Birth: [birth year] ? ?Transition of Care (TOC) CM/SW Contact  ?Ross Ludwig, LCSW ?Phone Number: ?08/17/2021, 3:15 PM ? ?Clinical Narrative:    ? ?CSW was informed that podiatry ordered a wound vac last night at 6:14pm.  TOC leaves at 5pm normally, orders were not seen till this morning.  CSW attempted to contact Tracey at [employer] for charity wound vac at 12:13 pm and at 1:28 pm, unable to get a hold of rep, had to leave a voice mail.  CSW contacted [employer] to see if they can provide charity wound vac, and per [place] they cannot.  CSW updated podiatry, attending physician, and bedside nurse, that CSW is unable to reach [employer] wound vac rep, and it may not be till Monday before it will be delivered.  CSW to attempt to reach [facility] tomorrow.  ? ?Expected Discharge Plan: [place] (vs home / self care) ?Barriers to Discharge: Continued Medical Work up ? ?Expected Discharge Plan and Services ?Expected Discharge Plan: [place] (vs home / self care) ?In-house Referral: Clinical Social Work ?  ?  ?Living arrangements for the past 2 months: [place] ?                ?  ?  ?  ?  ?  ?  ?  ?  ?  ?  ? ? ?Social Determinants of Health (SDOH) Interventions ?  ? ?Readmission Risk Interventions ? ?  08/15/2021  ?  1:24 PM  ?Readmission Risk Prevention Plan  ?Transportation Screening Complete  ?PCP or Specialist Appt within 5-7 Days Complete  ?Home Care Screening Complete  ? ? ?

## 2021-08-17 NOTE — Progress Notes (Signed)
Orthopedic Tech Progress Note ?Patient Details:  ?Raymond [name] ?12/30/1960 ?[id number] ? ?Ortho Devices ?Type of Ortho Device: Postop shoe/boot ?Ortho Device/Splint Location: right ?Ortho Device/Splint Interventions: Application ?  ?Post Interventions ?Patient Tolerated: Well ?Instructions Provided: Care of device ? ?[name] ?08/17/2021, 5:27 PM ? ?

## 2021-08-17 NOTE — Plan of Care (Signed)
Problem: Education: ?Goal: Knowledge of General Education information will improve ?Description: Including pain rating scale, medication(s)/side effects and non-pharmacologic comfort measures ?Outcome: Progressing ?  ?Problem: Activity: ?Goal: Risk for activity intolerance will decrease ?Outcome: Progressing ?  ?Problem: Pain Managment: ?Goal: General experience of comfort will improve ?Outcome: Progressing ?  ?Ivan Anchors, RN ?08/17/21 ?7:54 PM ? ?

## 2021-08-17 NOTE — Evaluation (Signed)
Physical Therapy Evaluation ?Patient Details ?Name: Raymond [name] ?MRN: [id number] ?DOB: 1960-08-15 ?Today's Date: 08/17/2021 ? ?History of Present Illness ? Pt is 61 yo male admitted on 08/14/21 with diabetic foot ulcer.  He is s/p I and D R foot ulcer on 08/15/21.  Pt with hx of DM, HTN, CKD and he reports orther foot ulcers/surgeries.  ?Clinical Impression ? Pt admitted with above diagnosis.  He has been mobilizing in his room independently using RW.  He demonstrated safe transfers and ambulation with RW while maintaining weight bearing on R heel only  Pt educated on precautions and doctor's recommendations for limited ambulation only.  Pt does not have a post op shoe in room - notified RN who was following up.  Pt has no other PT needs. Do recommend RW to keep pressure off R foot.   ?   ? ?Recommendations for follow up therapy are one component of a multi-disciplinary discharge planning process, led by the attending physician.  Recommendations may be updated based on patient status, additional functional criteria and insurance authorization. ? ?Follow Up Recommendations No PT follow up ? ?  ?Assistance Recommended at Discharge PRN  ?Patient can return home with the following ? Assistance with cooking/housework ? ?  ?Equipment Recommendations Rolling walker (2 wheels)  ?Recommendations for Other Services ?    ?  ?Functional Status Assessment Patient has not had a recent decline in their functional status  ? ?  ?Precautions / Restrictions Precautions ?Precautions: Other (comment) ?Precaution Comments: foot wound ?Required Braces or Orthoses: Other Brace ?Other Brace: note for post op shoe but no orders - notified RN ?Restrictions ?Weight Bearing Restrictions: Yes ?RLE Weight Bearing: Partial weight bearing (heel only) ?Other Position/Activity Restrictions: Per MD notes: PWB heel only - limited bed/bathroom  ? ?  ? ?Mobility ? Bed Mobility ?Overal bed mobility: Independent ?Bed Mobility: Supine to Sit, Sit to Supine ?   ?  ?Supine to sit: Independent ?Sit to supine: Independent ?  ?  ?  ? ?Transfers ?Overall transfer level: Modified independent ?Equipment used: Rolling walker (2 wheels) ?Transfers: Sit to/from Stand ?Sit to Stand: Modified independent (Device/Increase time) ?  ?  ?  ?  ?  ?General transfer comment: Pt has been mobilizing to bathroom on his own.  He demonstrated safe transfers durign therapy ?  ? ?Ambulation/Gait ?Ambulation/Gait assistance: Modified independent (Device/Increase time) ?Gait Distance (Feet): 70 Feet ?Assistive device: Rolling walker (2 wheels) ?Gait Pattern/deviations: Step-to pattern ?Gait velocity: normal ?  ?  ?General Gait Details: Pt has been mobilizing to bathroom on his own.  He demonstrated safe ambulation with R heel weighbearing only.  Pt steady with RW and with step to R gait to maintain precautions.  Only had pt ambulate household distance due to note for bed/bathroom ambulation ? ?Stairs ?  ?  ?  ?  ?  ? ?Wheelchair Mobility ?  ? ?Modified Rankin (Stroke Patients Only) ?  ? ?  ? ?Balance Overall balance assessment: Modified Independent ?  ?Sitting balance-Leahy Scale: Normal ?  ?  ?Standing balance support: No upper extremity supported, Bilateral upper extremity supported ?Standing balance-Leahy Scale: Good ?Standing balance comment: Utilizing RW to decrease weight on R LE but balance is good; could static stand without AD ?  ?  ?  ?  ?  ?  ?  ?  ?  ?  ?  ?   ? ? ? ?Pertinent Vitals/Pain Pain Assessment ?Pain Assessment: 0-10 ?Pain Score: 4  ?Pain Location: R foot ?  Pain Descriptors / Indicators: Discomfort ?Pain Intervention(s): Limited activity within patient's tolerance, Monitored during session  ? ? ?Home Living Family/patient expects to be discharged to:: Private residence ?Living Arrangements: Spouse/significant other ?Available Help at Discharge: Family;Available 24 hours/day ?Type of Home: House ?Home Access: Stairs to enter ?Entrance Stairs-Rails: Right;Left ?Entrance  Stairs-Number of Steps: 4 ?  ?Home Layout: One level ?Home Equipment: None ?   ?  ?Prior Function Prior Level of Function : Independent/Modified Independent;Driving ?  ?  ?  ?  ?  ?  ?Mobility Comments: could ambulate in community without AD ?ADLs Comments: Independent with ADLs and IADLs ?  ? ? ?Hand Dominance  ?   ? ?  ?Extremity/Trunk Assessment  ? Upper Extremity Assessment ?Upper Extremity Assessment: Overall WFL for tasks assessed ?  ? ?Lower Extremity Assessment ?Lower Extremity Assessment: RLE deficits/detail ?RLE Deficits / Details: Foot/ankle in bulky dressing, otherwise WFL ?  ? ?Cervical / Trunk Assessment ?Cervical / Trunk Assessment: Normal  ?Communication  ? Communication: No difficulties  ?Cognition Arousal/Alertness: Awake/alert ?Behavior During Therapy: [facility] for tasks assessed/performed ?Overall Cognitive Status: Within Functional Limits for tasks assessed ?  ?  ?  ?  ?  ?  ?  ?  ?  ?  ?  ?  ?  ?  ?  ?  ?  ?  ?  ? ?  ?General Comments General comments (skin integrity, edema, etc.): Pt reports has tv, bathroom, and refridgerator all in his room so will be able to limit time on R foot. Reports has had similar issues in past and familiar with recovery ? ?  ?Exercises    ? ?Assessment/Plan  ?  ?PT Assessment Patient does not need any further PT services  ?PT Problem List   ? ?   ?  ?PT Treatment Interventions     ? ?PT Goals (Current goals can be found in the Care Plan section)  ?Acute Rehab PT Goals ?Patient Stated Goal: return home ?PT Goal Formulation: All assessment and education complete, DC therapy ? ?  ?Frequency   ?  ? ? ?Co-evaluation   ?  ?  ?  ?  ? ? ?  ?AM-PAC PT "6 Clicks" Mobility  ?Outcome Measure Help needed turning from your back to your side while in a flat bed without using bedrails?: None ?Help needed moving from lying on your back to sitting on the side of a flat bed without using bedrails?: None ?Help needed moving to and from a bed to a chair (including a wheelchair)?: None ?Help  needed standing up from a chair using your arms (e.g., wheelchair or bedside chair)?: None ?Help needed to walk in hospital room?: None ?Help needed climbing 3-5 steps with a railing? : A Little ?6 Click Score: 23 ? ?  ?End of Session   ?Activity Tolerance: Patient tolerated treatment well ?Patient left: in bed;with call bell/phone within reach ?Nurse Communication: Mobility status;Other (comment) (need for post op shoe) ?PT Visit Diagnosis: Other abnormalities of gait and mobility (R26.89) ?  ? ?Time: 1610-9604 ?PT Time Calculation (min) (ACUTE ONLY): 13 min ? ? ?Charges:   PT Evaluation ?$PT Eval Low Complexity: 1 Low ?  ?  ?   ? ? ?Abran Richard, PT ?Acute Rehab Services ?Pager [phone] ?[facility] [phone] ? ? ?Mikael Spray Robi Mitter ?08/17/2021, 4:04 PM ? ?

## 2021-08-17 NOTE — Progress Notes (Signed)
?PROGRESS NOTE ? ? ? ?Raymond [name]  VQM:[id number] DOB: 1960/08/19 DOA: 08/14/2021 ?PCP: [name], FNP  ? ?  ?Brief Narrative:  ?Raymond [name] is a 61 y.o. male with medical history significant of DM, HTN, CKD who presents with acute on chronic diabetic foot wound on the plantar surface of his right foot.  He states that this has been ongoing for about 6 months.  He had followed up with Dr. Jacqualyn Posey of podiatry, infectious disease, outpatient wound clinic, as well as Dr. Gwenlyn Found of cardiology.  He states that nothing has really helped throughout all of this.  2 days ago, he noticed significant right foot swelling.  He did also have one-time episode of nausea and vomiting.  Admits to chills without any documented fevers.  No chest pain, shortness of breath or significant cough.  No recent exposure to COVID-positive patients. WBC 14.4, creatinine 1.49.  MRI of the foot showed extensive soft tissue swelling of the foot with small pockets of fluid without osteomyelitis. Patient admitted and started on IV antibiotics.  Podiatry consulted.  Patient underwent incision and drainage with debridement and wound culture with Dr. Cannon Kettle on 3/23. ? ?New events last 24 hours / Subjective: ?No new complaints, feeling well. We're awaiting on wound vac delivery.  ? ?Assessment & Plan: ?  ?Principal Problem: ?  Diabetic foot ulcer ([place]) ?Active Problems: ?  Type 2 diabetes mellitus ([place]) ?  Essential hypertension, benign ?  Mixed hyperlipidemia ?  History of renal transplant ?  CKD (chronic kidney disease) stage 3, GFR 30-59 ml/min ([facility]) ? ? ?Nonhealing diabetic right foot wound  ?-MRI did not reveal osteomyelitis, but did see extensive soft tissue swelling of the foot with small pockets of fluid within the deep intrinsic muscular compartment representing localized fluid/edema or developing abscesses ?-Has followed with Dr. Jacqualyn Posey in the past (last seen Nov 2022) ?-Followed by Dr. Gwenlyn Found for history of critical limb ischemia.   Was most recently seen in July 25, 2021 regarding his poorly healing wounds and was determined to not require any invasive peripheral vascular procedure.  Patient to follow up with Dr. Gwenlyn Found on discharge. ?-S/p incision and drainage with debridement and wound culture with Dr. Cannon Kettle on 3/23 ?-Surgical wound culture is showing strep agalactiae. Discussed with pharmacy. Deescalated to unasyn and will plan for augmentin for discharge.  ?-Awaiting wound vac delivery and placement. Discussed with TOC and Dr. Cannon Kettle.  ? ? DM ?-Semglee, SSI ?  ?CKD stage 3a s/p renal transplant  ?-Baseline Cr ~1.1-1.4  ?-Continue tacrolimus, sodium bicarb  ?-Stable  ?  ?HTN ?-Catapres patch, hydralazine, doxazosin  ?  ?HLD ?-Lipitor  ? ? ?DVT prophylaxis:  ?heparin injection 5,000 Units Start: 08/14/21 2200 ? ?Code Status: Full code ?Family Communication: No family at bedside ?Disposition Plan:  ?Status is: Inpatient ?Remains inpatient appropriate because: IV antibiotics, waiting wound vac  ? ?Antimicrobials:  ?Anti-infectives (From admission, onward)  ? ? Start     Dose/Rate Route Frequency Ordered Stop  ? 08/16/21 1415  Ampicillin-Sulbactam (UNASYN) 3 g in sodium chloride 0.9 % 100 mL IVPB       ? 3 g ?200 mL/hr over 30 Minutes Intravenous Every 6 hours 08/16/21 1316    ? 08/15/21 1800  vancomycin (VANCOCIN) IVPB 1000 mg/200 mL premix  Status:  Discontinued       ? 1,000 mg ?200 mL/hr over 60 Minutes Intravenous Every 24 hours 08/14/21 1807 08/16/21 1314  ? 08/14/21 2200  cefTRIAXone (ROCEPHIN) 2 g in sodium chloride  0.9 % 100 mL IVPB  Status:  Discontinued       ? 2 g ?200 mL/hr over 30 Minutes Intravenous Every 24 hours 08/14/21 1751 08/16/21 1314  ? 08/14/21 1930  metroNIDAZOLE (FLAGYL) IVPB 500 mg  Status:  Discontinued       ? 500 mg ?100 mL/hr over 60 Minutes Intravenous Every 12 hours 08/14/21 1751 08/16/21 1314  ? 08/14/21 1730  vancomycin (VANCOREADY) IVPB 2000 mg/400 mL       ? 2,000 mg ?200 mL/hr over 120 Minutes Intravenous   Once 08/14/21 1728 08/14/21 1957  ? 08/14/21 1715  piperacillin-tazobactam (ZOSYN) IVPB 3.375 g       ? 3.375 g ?100 mL/hr over 30 Minutes Intravenous  Once 08/14/21 1704 08/14/21 1740  ? 08/14/21 1615  piperacillin-tazobactam (ZOSYN) IVPB 3.375 g  Status:  Discontinued       ? 3.375 g ?100 mL/hr over 30 Minutes Intravenous  Once 08/14/21 1604 08/14/21 1704  ? ?  ? ? ? ?Objective: ?Vitals:  ? 08/16/21 1358 08/16/21 2200 08/17/21 0609 08/17/21 1310  ?BP: (!) 139/56 (!) 147/78 (!) 193/93 140/69  ?Pulse: 63 74 71 61  ?Resp: '18 17 18 16  '$ ?Temp:  98.6 ?F (37 ?C) 98.2 ?F (36.8 ?C) 98.1 ?F (36.7 ?C)  ?TempSrc:  Oral Oral   ?SpO2: 97% 99% 98% 94%  ?Weight:      ?Height:      ? ? ?Intake/Output Summary (Last 24 hours) at 08/17/2021 1329 ?Last data filed at 08/17/2021 1225 ?Gross per 24 hour  ?Intake 1445.45 ml  ?Output --  ?Net 1445.45 ml  ? ? ?Filed Weights  ? 08/14/21 1822  ?Weight: 98.8 kg  ? ? ?Examination:  ?General exam: Appears calm and comfortable  ?Respiratory system: Clear to auscultation. Respiratory effort normal. No respiratory distress. No conversational dyspnea.  ?Cardiovascular system: S1 & S2 heard, RRR. No murmurs.  ?Gastrointestinal system: Abdomen is nondistended, soft and nontender. Normal bowel sounds heard. ?Central nervous system: Alert and oriented. No focal neurological deficits. Speech clear.  ?Extremities: Symmetric in appearance  ?Skin: Right foot wrapped in dressing  ?Psychiatry: Judgement and insight appear normal. Mood & affect appropriate.  ? ?Data Reviewed: I have personally reviewed following labs and imaging studies ? ?CBC: ?Recent Labs  ?Lab 08/14/21 ?1159 08/15/21 ?0322 08/16/21 ?0308  ?WBC 14.4* 11.9* 11.7*  ?NEUTROABS 11.3*  --   --   ?HGB 12.0* 11.0* 9.9*  ?HCT 38.9* 36.0* 33.0*  ?MCV 81.2 82.2 83.3  ?PLT 244 195 208  ? ? ?Basic Metabolic Panel: ?Recent Labs  ?Lab 08/14/21 ?1159 08/15/21 ?0322 08/16/21 ?0308  ?NA 135 138 137  ?K 4.4 3.8 4.2  ?CL 104 107 107  ?CO2 '25 25 23  '$ ?GLUCOSE 85  121* 159*  ?BUN 32* 32* 38*  ?CREATININE 1.49* 1.46* 1.41*  ?CALCIUM 9.5 9.2 8.8*  ? ? ?GFR: ?Estimated Creatinine Clearance: 62.4 mL/min (A) (by C-G formula based on SCr of 1.41 mg/dL (H)). ?Liver Function Tests: ?No results for input(s): AST, ALT, ALKPHOS, BILITOT, PROT, ALBUMIN in the last 168 hours. ?No results for input(s): LIPASE, AMYLASE in the last 168 hours. ?No results for input(s): AMMONIA in the last 168 hours. ?Coagulation Profile: ?No results for input(s): INR, PROTIME in the last 168 hours. ?Cardiac Enzymes: ?No results for input(s): CKTOTAL, CKMB, CKMBINDEX, TROPONINI in the last 168 hours. ?BNP (last 3 results) ?No results for input(s): PROBNP in the last 8760 hours. ?HbA1C: ?Recent Labs  ?  08/14/21 ?1842  ?  HGBA1C 10.4*  ? ? ?CBG: ?Recent Labs  ?Lab 08/16/21 ?1205 08/16/21 ?1720 08/16/21 ?2153 08/17/21 ?0804 08/17/21 ?1147  ?GLUCAP 202* 201* 190* 228* 276*  ? ? ?Lipid Profile: ?No results for input(s): CHOL, HDL, LDLCALC, TRIG, CHOLHDL, LDLDIRECT in the last 72 hours. ?Thyroid Function Tests: ?No results for input(s): TSH, T4TOTAL, FREET4, T3FREE, THYROIDAB in the last 72 hours. ?Anemia Panel: ?No results for input(s): VITAMINB12, FOLATE, FERRITIN, TIBC, IRON, RETICCTPCT in the last 72 hours. ?Sepsis Labs: ?No results for input(s): PROCALCITON, LATICACIDVEN in the last 168 hours. ? ?Recent Results (from the past 240 hour(s))  ?Culture, blood (routine x 2)     Status: None (Preliminary result)  ? Collection Time: 08/14/21  4:45 PM  ? Specimen: BLOOD  ?Result Value Ref Range Status  ? Specimen Description   Final  ?  BLOOD BLOOD RIGHT WRIST ?Performed at [facility], [place] [address], [place], [place] [address] ?  ? Special Requests   Final  ?  BOTTLES DRAWN AEROBIC AND ANAEROBIC Blood Culture adequate volume ?Performed at [facility], [place] [address] [place], [place] [address] ?  ? Culture   Final  ?  NO GROWTH 3 DAYS ?Performed at [facility], [place] [address], [place], [place] [address] ?  ? Report Status PENDING  Incomplete  ?Culture, blood (routine x 2)     Status: None (Preliminary result)  ? Collection Time: 08/14/21  4:50 PM  ? Specimen: BLOOD  ?Res

## 2021-08-18 LAB — GLUCOSE, CAPILLARY
Glucose-Capillary: 308 mg/dL — ABNORMAL HIGH (ref 70–99)
Glucose-Capillary: 242 mg/dL — ABNORMAL HIGH (ref 70–99)

## 2021-08-18 LAB — BASIC METABOLIC PANEL
Anion gap: 6 (ref 5–15)
BUN: 54 mg/dL — ABNORMAL HIGH (ref 6–20)
CO2: 26 mmol/L (ref 22–32)
Calcium: 9.1 mg/dL (ref 8.9–10.3)
Chloride: 108 mmol/L (ref 98–111)
Creatinine, Ser: 1.32 mg/dL — ABNORMAL HIGH (ref 0.61–1.24)
GFR, Estimated: >60 mL/min (ref >60)
Glucose, Bld: 312 mg/dL — ABNORMAL HIGH (ref 70–99)
Potassium: 4.1 mmol/L (ref 3.5–5.1)
Sodium: 140 mmol/L (ref 135–145)

## 2021-08-18 MED ORDER — INSULIN GLARGINE-YFGN 100 UNIT/ML ~~LOC~~ SOLN
20.0000 [IU] | Freq: Every day | SUBCUTANEOUS | Status: DC
  Filled 2021-08-18: qty 0.2

## 2021-08-18 MED ORDER — AMOXICILLIN-POT CLAVULANATE 875-125 MG PO TABS: 1.0000 | ORAL_TABLET | Freq: Two times a day (BID) | ORAL | 0 refills | Status: DC

## 2021-08-18 MED ORDER — HYDROCODONE-ACETAMINOPHEN 5-325 MG PO TABS: 1.0000 | ORAL_TABLET | ORAL | 0 refills | Status: DC | PRN

## 2021-08-18 MED ORDER — INSULIN ASPART 100 UNIT/ML IJ SOLN
5.0000 [IU] | Freq: Three times a day (TID) | INTRAMUSCULAR | Status: DC
  Administered 2021-08-18 (×2): 5 [IU] via SUBCUTANEOUS

## 2021-08-18 NOTE — TOC Transition Note (Signed)
Transition of Care (TOC) - CM/SW Discharge Note ? ? ?Patient Details  ?Name: Raymond [name] ?MRN: [id number] ?Date of Birth: 02-Dec-1960 ? ?Transition of Care (TOC) CM/SW Contact:  ?Ross Ludwig, LCSW ?Phone Number: ?08/18/2021, 3:24 PM ? ? ?Clinical Narrative:    ? ?CSW received a call back from [place] [facility] [place].  CSW explained to [facility] that patient will need a wound vac and does not have insurance.  Per Olivia Mackie, he needs to fill out a charity form, CSW was emailed the form and assisted patient with feeling it out, and then sent back to [facility].  Olivia Mackie said she will e-mail the order form to the podiatrist.  Per podiatry patient can discharge today, and his wound vac will be delivered to his home tomorrow.  Podiatry stated they will assist with putting the wound vac on the patient in the office.  Patient does not have insurance, and lives with his wife.  Patient did not express any other concerns or issues about returning back home. ? ?Final next level of care: Home/Self Care ?Barriers to Discharge: Barriers Resolved ? ? ?Patient Goals and CMS Choice ?Patient states their goals for this hospitalization and ongoing recovery are:: To return back home with wound vac. ?CMS Medicare.gov Compare Post Acute Care list provided to:: Patient ?Choice offered to / list presented to : Patient ? ?Discharge Placement ?  ?           ?  ?  ?  ?  ? ?Discharge Plan and Services ?In-house Referral: Clinical Social Work ?  ?           ?DME Arranged: Negative pressure wound device ?DME Agency: [employer] ?Date DME Agency Contacted: 08/18/21 ?Time DME Agency Contacted: [phone] ?Representative spoke with at DME Agency: Olivia Mackie ?  ?  ?  ?  ?  ? ?Social Determinants of Health (SDOH) Interventions ?  ? ? ?Readmission Risk Interventions ? ?  08/15/2021  ?  1:24 PM  ?Readmission Risk Prevention Plan  ?Transportation Screening Complete  ?PCP or Specialist Appt within 5-7 Days Complete  ?Home Care Screening Complete  ? ? ? ? ? ?

## 2021-08-18 NOTE — Progress Notes (Addendum)
Subjective: ?Raymond [name] is a 61 y.o. male patient seen at bedside, resting comfortably in no acute distress s/p day #3 Right irrigation debridement plantar foot wound. Patient reports he is feeling good, very minimal pain in the foot and states that he doesn't feel dizzy anymore, denies any other constitutional symptoms at this time. ? ?Patient Active Problem List  ? Diagnosis Date Noted  ? Diabetic foot ulcer ([place]) 08/14/2021  ? CKD (chronic kidney disease) stage 3, GFR 30-59 ml/min (HCC) 08/14/2021  ? Hypomagnesemia 06/25/2021  ? Septic arthritis of elbow, left ([place]) 06/24/2021  ? Peripheral vascular disease ([place]) 04/24/2020  ? Pyogenic inflammation of bone (HCC)   ? Thrombophlebitis of superficial veins of right lower extremity   ? Difficult intravenous access   ? MRSA bacteremia   ? Diabetic foot infection ([place])   ? Infected wound 01/12/2020  ? Critical lower limb ischemia ([place]) 02/02/2019  ? Personal history of noncompliance with medical treatment, presenting hazards to health 10/07/2018  ? Critical limb ischemia with history of revascularization of same extremity ([place]) 07/27/2018  ? Mixed hyperlipidemia 02/19/2018  ? History of renal transplant 02/19/2018  ? Conductive hearing loss, bilateral 08/31/2017  ? Nuclear sclerotic cataract of right eye 10/07/2016  ? Pseudophakia of left eye 10/07/2016  ? Hypertension due to endocrine disorder 03/04/2016  ? 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 Renal failure 03/04/2016  ? Proliferative diabetic retinopathy with macular edema associated with type 2 diabetes mellitus ([place]) 03/04/2016  ? Immunosuppression ([place]) 12/10/2015  ? Nausea vomiting and diarrhea 12/10/2015  ? Type 2 diabetes mellitus ([place]) 12/10/2015  ? Essential hypertension, benign 12/10/2015  ? Pancytopenia ([place]) 12/10/2015  ? Encounter for screening colonoscopy 11/15/2014  ? GERD (gastroesophageal reflux disease)  11/15/2014  ? ? ? ?Current Facility-Administered Medications:  ?  0.9 %  sodium chloride infusion, , Intravenous, Continuous, Dessa Phi, DO, Last Rate: 20 mL/hr at 08/15/21 2012, New Bag at 08/15/21 2012 ?  0.9 %  sodium chloride infusion, 250 mL, Intravenous, PRN, Dessa Phi, DO ?  acetaminophen (TYLENOL) tablet 650 mg, 650 mg, Oral, Q6H PRN **OR** acetaminophen (TYLENOL) suppository 650 mg, 650 mg, Rectal, Q6H PRN, Dessa Phi, DO ?  Ampicillin-Sulbactam (UNASYN) 3 g in sodium chloride 0.9 % 100 mL IVPB, 3 g, Intravenous, Q6H, Dimple Nanas, RPH, Last Rate: 200 mL/hr at 08/18/21 0842, 3 g at 08/18/21 0842 ?  ascorbic acid (VITAMIN C) tablet 250 mg, 250 mg, Oral, BID, Dessa Phi, DO, 250 mg at 08/18/21 1007 ?  atorvastatin (LIPITOR) tablet 10 mg, 10 mg, Oral, Daily, Dessa Phi, DO, 10 mg at 08/18/21 1007 ?  brimonidine (ALPHAGAN) 0.2 % ophthalmic solution 1 drop, 1 drop, Both Eyes, TID, Dessa Phi, DO, 1 drop at 08/18/21 1011 ?  cloNIDine (CATAPRES - Dosed in mg/24 hr) patch 0.3 mg, 0.3 mg, Transdermal, Weekly, Dessa Phi, DO, 0.3 mg at 08/18/21 1009 ?  dorzolamide-timolol (COSOPT) 22.3-6.8 MG/ML ophthalmic solution 1 drop, 1 drop, Both Eyes, BID, Dessa Phi, DO, 1 drop at 08/18/21 1011 ?  doxazosin (CARDURA) tablet 4 mg, 4 mg, Oral, Daily, Dessa Phi, DO, 4 mg at 08/18/21 1007 ?  heparin injection 5,000 Units, 5,000 Units, Subcutaneous, Q8H, Dessa Phi, DO, 5,000 Units at 08/18/21 2585 ?  hydrALAZINE (APRESOLINE) injection 5 mg, 5 mg, Intravenous, Q4H PRN, Dessa Phi, DO, 5 mg at 08/17/21 2778 ?  hydrALAZINE (APRESOLINE) tablet 25 mg, 25 mg, Oral, BID, Dessa Phi,  DO, 25 mg at 08/18/21 1007 ?  HYDROcodone-acetaminophen (NORCO/VICODIN) 5-325 MG per tablet 1-2 tablet, 1-2 tablet, Oral, Q4H PRN, Dessa Phi, DO, 2 tablet at 08/18/21 8099 ?  insulin aspart (novoLOG) injection 0-15 Units, 0-15 Units, Subcutaneous, TID WC, Dessa Phi, DO, 11 Units at 08/18/21  8338 ?  insulin aspart (novoLOG) injection 5 Units, 5 Units, Subcutaneous, TID WC, Dessa Phi, DO, 5 Units at 08/18/21 2505 ?  insulin glargine-yfgn (SEMGLEE) injection 20 Units, 20 Units, Subcutaneous, QHS, Dessa Phi, DO ?  latanoprost (XALATAN) 0.005 % ophthalmic solution 1 drop, 1 drop, Both Eyes, QHS, Choi, Jennifer, DO, 1 drop at 08/17/21 2200 ?  lip balm (CARMEX) ointment, , Topical, PRN, Dessa Phi, DO, Given at 08/16/21 1825 ?  loperamide (IMODIUM) capsule 2 mg, 2 mg, Oral, PRN, Dessa Phi, DO, 2 mg at 08/18/21 1022 ?  multivitamin with minerals tablet 1 tablet, 1 tablet, Oral, Daily, Dessa Phi, DO, 1 tablet at 08/18/21 1007 ?  nutrition supplement (JUVEN) (JUVEN) powder packet 1 packet, 1 packet, Oral, BID BM, Dessa Phi, DO, 1 packet at 08/18/21 1010 ?  ondansetron (ZOFRAN) tablet 4 mg, 4 mg, Oral, Q6H PRN **OR** ondansetron (ZOFRAN) injection 4 mg, 4 mg, Intravenous, Q6H PRN, Dessa Phi, DO ?  polyethylene glycol (MIRALAX / GLYCOLAX) packet 17 g, 17 g, Oral, Daily PRN, Dessa Phi, DO ?  protein supplement (ENSURE MAX) liquid, 11 oz, Oral, Daily, Dessa Phi, DO, 11 oz at 08/17/21 1331 ?  sodium bicarbonate tablet 1,300 mg, 1,300 mg, Oral, TID, Dessa Phi, DO, 1,300 mg at 08/18/21 1007 ?  sodium chloride flush (NS) 0.9 % injection 3 mL, 3 mL, Intravenous, Q12H, Dessa Phi, DO, 3 mL at 08/18/21 1011 ?  sodium chloride flush (NS) 0.9 % injection 3 mL, 3 mL, Intravenous, PRN, Dessa Phi, DO ?  tacrolimus (PROGRAF) capsule 3 mg, 3 mg, Oral, Daily, Dessa Phi, DO, 3 mg at 08/18/21 1007 ?  tacrolimus (PROGRAF) capsule 4 mg, 4 mg, Oral, QHS, Dessa Phi, DO, 4 mg at 08/17/21 2152 ?  zinc sulfate capsule 220 mg, 220 mg, Oral, Daily, Dessa Phi, DO, 220 mg at 08/18/21 1007 ? ?No Known Allergies ? ? ?Objective: ?Today's Vitals  ? 08/18/21 0437 08/18/21 0500 08/18/21 0625 08/18/21 0709  ?BP:  (!) 148/79    ?Pulse:  68    ?Resp:  14    ?Temp:  98.1 ?F (36.7  ?C)    ?TempSrc:      ?SpO2:  99%    ?Weight:      ?Height:      ?PainSc: 0-No pain  6  0-No pain  ? ? ?General: No acute distress ? ?Right Lower extremity: Dressing to Right foot clean, dry, intact. No strikethrough noted, Upon removal of dressings, plantar wound to the right foot that measures 7 cm in length by 3 cm in width with bloody base tendon and deep soft tissue exposed and Surgicel foam in the wound bed as previous.  Localized edema and erythema, no active purulence noted, There is a new clear fluid blister at the right 4th toe, once lanced no purulence, no malodor, localized edema and improving erythema, mild pain with palpation to the right foot.  Range of motion excluding surgical site within normal limits with no pain or crepitation.   ? ?Results for orders placed or performed during the hospital encounter of 08/14/21  ?Culture, blood (routine x 2)     Status: None (Preliminary result)  ? Collection Time: 08/14/21  4:45  PM  ? Specimen: BLOOD  ?Result Value Ref Range Status  ? Specimen Description   Final  ?  BLOOD BLOOD RIGHT WRIST ?Performed at [facility], [place] [address], [place], [place] [address] ?  ? Special Requests   Final  ?  BOTTLES DRAWN AEROBIC AND ANAEROBIC Blood Culture adequate volume ?Performed at [facility], [place] [address], [place], [place] [address] ?  ? Culture   Final  ?  NO GROWTH 4 DAYS ?Performed at [facility], [place] [address], [place], [place] [address] ?  ? Report Status PENDING  Incomplete  ?Culture, blood (routine x 2)     Status: None (Preliminary result)  ? Collection Time: 08/14/21  4:50 PM  ? Specimen: BLOOD  ?Result Value Ref Range Status  ? Specimen Description   Final  ?  BLOOD RIGHT ANTECUBITAL ?Performed at [facility], [place] [address], [place], [place] [address] ?  ? Special Requests   Final  ?  BOTTLES DRAWN AEROBIC AND ANAEROBIC Blood Culture adequate volume ?Performed at [facility], [place] [address], [place], [place] [address] ?  ? Culture   Final  ?  NO GROWTH 4 DAYS ?Performed at [facility], [place] [address], [place], [place] [address] ?  ? Report Status PENDING  Incompl

## 2021-08-18 NOTE — Discharge Summary (Signed)
Physician Discharge Summary  ?Raymond [name] PQA:[id number] DOB: 12-30-1960 DOA: 08/14/2021 ? ?PCP: [name], FNP ? ?Admit date: 08/14/2021 ?Discharge date: 08/18/2021 ? ?Admitted From: Home ?Disposition:  Home ? ?Recommendations for Outpatient Follow-up:  ?Follow up with PCP in 1 week ?Follow up with Dr. Gwenlyn Found ?Follow up with Podiatry Dr. Cannon Kettle or Dr. Jacqualyn Posey for wound vac placement  ? ?Discharge Condition: Stable ?CODE STATUS: Full  ?Diet recommendation: Carb modified  ? ?Brief/Interim Summary: ?Raymond [name] is a 61 y.o. male with medical history significant of DM, HTN, CKD who presents with acute on chronic diabetic foot wound on the plantar surface of his right foot.  He states that this has been ongoing for about 6 months.  He had followed up with Dr. Jacqualyn Posey of podiatry, infectious disease, outpatient wound clinic, as well as Dr. Gwenlyn Found of cardiology.  He states that nothing has really helped throughout all of this.  2 days ago, he noticed significant right foot swelling.  He did also have one-time episode of nausea and vomiting.  Admits to chills without any documented fevers.  No chest pain, shortness of breath or significant cough.  No recent exposure to COVID-positive patients. WBC 14.4, creatinine 1.49.  MRI of the foot showed extensive soft tissue swelling of the foot with small pockets of fluid without osteomyelitis. Patient admitted and started on IV antibiotics.  Podiatry consulted.  Patient underwent incision and drainage with debridement and wound culture with Dr. Cannon Kettle on 3/23. Antibiotic was deescalated to Augmentin for discharge.  ? ?Discharge Diagnoses:  ?Principal Problem: ?  Diabetic foot ulcer ([place]) ?Active Problems: ?  Type 2 diabetes mellitus ([place]) ?  Essential hypertension, benign ?  Mixed hyperlipidemia ?  History of renal transplant ?  CKD (chronic kidney disease) stage 3, GFR 30-59 ml/min (HCC) ? ? ?Nonhealing diabetic right foot wound  ?-MRI did not reveal osteomyelitis, but did  see extensive soft tissue swelling of the foot with small pockets of fluid within the deep intrinsic muscular compartment representing localized fluid/edema or developing abscesses ?-Has followed with Dr. Jacqualyn Posey in the past (last seen Nov 2022) ?-Followed by Dr. Gwenlyn Found for history of critical limb ischemia.  Was most recently seen in July 25, 2021 regarding his poorly healing wounds and was determined to not require any invasive peripheral vascular procedure.  Patient to follow up with Dr. Gwenlyn Found on discharge. ?-S/p incision and drainage with debridement and wound culture with Dr. Cannon Kettle on 3/23 ?-Surgical wound culture is showing strep agalactiae. Discussed with pharmacy. Deescalated to unasyn and plan for augmentin for discharge.  ?-Discussed with Dr. Cannon Kettle. Patient to follow up with podiatry in office for wound vac needs  ?  ? DM ?-Semglee, SSI ?  ?CKD stage 3a s/p renal transplant  ?-Baseline Cr ~1.1-1.4  ?-Continue tacrolimus, sodium bicarb  ?-Stable  ?  ?HTN ?-Catapres patch, hydralazine, doxazosin  ?  ?HLD ?-Lipitor  ?  ? ?Discharge Instructions ? ?Discharge Instructions   ? ? Call MD for:  difficulty breathing, headache or visual disturbances   Complete by: As directed ?  ? Call MD for:  extreme fatigue   Complete by: As directed ?  ? Call MD for:  hives   Complete by: As directed ?  ? Call MD for:  persistant dizziness or light-headedness   Complete by: As directed ?  ? Call MD for:  persistant nausea and vomiting   Complete by: As directed ?  ? Call MD for:  severe uncontrolled pain   Complete  by: As directed ?  ? Call MD for:  temperature >100.4   Complete by: As directed ?  ? Discharge instructions   Complete by: As directed ?  ? You were cared for by a hospitalist during your hospital stay. If you have any questions about your discharge medications or the care you received while you were in the hospital after you are discharged, you can call the unit and ask to speak with the hospitalist on call if the  hospitalist that took care of you is not available. Once you are discharged, your primary care physician will handle any further medical issues. Please note that NO REFILLS for any discharge medications will be authorized once you are discharged, as it is imperative that you return to your primary care physician (or establish a relationship with a primary care physician if you do not have one) for your aftercare needs so that they can reassess your need for medications and monitor your lab values.  ? Increase activity slowly   Complete by: As directed ?  ? Leave dressing on - Keep it clean, dry, and intact until clinic visit   Complete by: As directed ?  ? ?  ? ?Allergies as of 08/18/2021   ?No Known Allergies ?  ? ?  ?Medication List  ?  ? ?TAKE these medications   ? ?Accu-Chek Guide Me w/Device Kit ?1 Piece by Does not apply route as directed. ?  ?amoxicillin-clavulanate 875-125 MG tablet ?Commonly known as: Augmentin ?Take 1 tablet by mouth 2 (two) times daily for 14 days. ?  ?atorvastatin 10 MG tablet ?Commonly known as: LIPITOR ?Take 10 mg by mouth daily. ?What changed: Another medication with the same name was removed. Continue taking this medication, and follow the directions you see here. ?  ?blood glucose meter kit and supplies Kit ?Dispense based on patient and insurance preference. Use up to four times daily as directed. (FOR ICD-9 250.00, 250.01). ?  ?brimonidine 0.2 % ophthalmic solution ?Commonly known as: ALPHAGAN ?Place 1 drop into both eyes 3 (three) times daily. ?  ?cloNIDine 0.3 mg/24hr patch ?Commonly known as: CATAPRES - Dosed in mg/24 hr ?Place 0.3 mg onto the skin once a week. ?  ?colchicine 0.6 MG tablet ?Take 0.6 mg by mouth daily as needed (gout). ?  ?dorzolamide-timolol 22.3-6.8 MG/ML ophthalmic solution ?Commonly known as: COSOPT ?Place 1 drop into both eyes 2 (two) times daily. ?  ?doxazosin 4 MG tablet ?Commonly known as: CARDURA ?Take 4 mg by mouth daily. ?  ?glucose blood test  strip ?Commonly known as: Accu-Chek Guide ?Use as instructed 4 x daily. E11.65 ?  ?hydrALAZINE 25 MG tablet ?Commonly known as: APRESOLINE ?Take 25 mg by mouth 2 (two) times daily. ?  ?HYDROcodone-acetaminophen 5-325 MG tablet ?Commonly known as: NORCO/VICODIN ?Take 1 tablet by mouth every 4 (four) hours as needed for moderate pain. ?  ?latanoprost 0.005 % ophthalmic solution ?Commonly known as: XALATAN ?Place 1 drop into both eyes at bedtime. ?  ?multivitamin capsule ?Take 1 capsule by mouth daily. ?  ?NovoLIN N FlexPen 100 UNIT/ML Kiwkpen ?Generic drug: Insulin NPH (Human) ([place]) ?Inject 50 Units into the skin every morning. ?What changed:  ?how much to take ?when to take this ?  ?sodium bicarbonate 650 MG tablet ?Take 1,300 mg by mouth 3 (three) times daily. ?  ?tacrolimus 1 MG capsule ?Commonly known as: PROGRAF ?Take 3-4 mg by mouth See admin instructions. Take 3 capsules (11m) in AM & take 4 capsules (42m  in PM ?  ?Trulicity 3.76 EG/3.1DV Sopn ?Generic drug: Dulaglutide ?Inject 0.75 mg into the skin once a week. ?  ?Vitamin D (Ergocalciferol) 1.25 MG (50000 UNIT) Caps capsule ?Commonly known as: DRISDOL ?Take 50,000 Units by mouth every 30 (thirty) days. ?  ? ?  ? ?  ?  ? ? ?  ?Durable Medical Equipment  ?(From admission, onward)  ?  ? ? ?  ? ?  Start     Ordered  ? 08/16/21 1814  For home use only DME Negative pressure wound device  Once       ?Question Answer Comment  ?Frequency of dressing change 3 times per week   ?Length of need 3 Months   ?Dressing type Foam   ?Amount of suction 125 mm/Hg   ?Pressure application Continuous pressure   ?Supplies 10 canisters and 15 dressings per month for duration of therapy   ?  ? 08/16/21 1814  ? ?  ?  ? ?  ? ? ?  ?Discharge Care Instructions  ?(From admission, onward)  ?  ? ? ?  ? ?  Start     Ordered  ? 08/18/21 0000  Leave dressing on - Keep it clean, dry, and intact until clinic visit       ? 08/18/21 1207  ? ?  ?  ? ?  ? ? Follow-up Information   ? ? [name], FNP Follow up.   ?Specialty: General Practice ?Contact information: ?[address] ?[address] ?[place] [address] ?[phone] ? ? ?  ?  ? ? Lorretta Harp, MD Follow up.   ?Specialties: [employer]

## 2021-08-19 ENCOUNTER — Telehealth: Payer: Self-pay

## 2021-08-19 ENCOUNTER — Encounter (HOSPITAL_BASED_OUTPATIENT_CLINIC_OR_DEPARTMENT_OTHER): Payer: Self-pay | Admitting: General Surgery

## 2021-08-19 ENCOUNTER — Other Ambulatory Visit: Payer: Self-pay | Admitting: Internal Medicine

## 2021-08-19 LAB — CULTURE, BLOOD (ROUTINE X 2)
Culture: NO GROWTH
Culture: NO GROWTH
Special Requests: ADEQUATE
Special Requests: ADEQUATE

## 2021-08-19 MED ORDER — SULFAMETHOXAZOLE-TRIMETHOPRIM 800-160 MG PO TABS: 1.0000 | ORAL_TABLET | Freq: Two times a day (BID) | ORAL | 0 refills | Status: DC

## 2021-08-19 NOTE — Telephone Encounter (Signed)
Attempted to call pt to set up office visit per Dr. Gwenlyn Found. Unable to leave message, pt's mailbox is full. ?

## 2021-08-19 NOTE — Progress Notes (Signed)
?  PROGRESS NOTE ? ?Notified by inpatient pharmacist that patient's wound culture updated to reflect staph lugdunesis not covered by augmentin that was prescribed at discharge. Recommended to add bactrim to cover staph lugdunesis as well as to continue augmentin to cover strep agalactiae.  ? ?Bactrim prescribed.  ?FYI message sent to Dr. Cannon Kettle to keep her in the loop.  ?Called patient but no answer and voice mailbox full.  ? ? ?Dessa Phi, DO ?[facility] ?08/19/2021, 10:19 AM ? ?Available via Epic secure chat 7am-7pm ?After these hours, please refer to coverage provider listed on [email or web address]  ? ?

## 2021-08-20 ENCOUNTER — Telehealth: Payer: Self-pay

## 2021-08-20 ENCOUNTER — Telehealth: Payer: Self-pay | Admitting: *Deleted

## 2021-08-20 LAB — AEROBIC/ANAEROBIC CULTURE W GRAM STAIN (SURGICAL/DEEP WOUND): Gram Stain: NONE SEEN

## 2021-08-20 LAB — ZINC: Zinc: 43 ug/dL — ABNORMAL LOW (ref 44–115)

## 2021-08-20 LAB — COPPER, SERUM: Copper: 126 ug/dL (ref 69–132)

## 2021-08-20 LAB — VITAMIN C: Vitamin C: 0.6 mg/dL (ref 0.4–2.0)

## 2021-08-20 NOTE — Telephone Encounter (Signed)
Spoke with pt this morning regarding return office visit. Able set up appointment for pt. Pt verbalizes understanding. ?

## 2021-08-20 NOTE — Telephone Encounter (Signed)
Transition Care Management Follow-up Telephone Call ?Date of discharge and from where: 08/18/2021 [facility] ?How have you been since you were released from the hospital? A little better ?Any questions or concerns? No ? ?Items Reviewed: ?Did the pt receive and understand the discharge instructions provided? Yes  ?Medications obtained and verified? Yes  ?Other? No  ?Any new allergies since your discharge? No  ?Dietary orders reviewed? Yes ?Do you have support at home? Yes  ? ?Home Care and Equipment/Supplies: ?Were home health services ordered? no ?If so, what is the name of the agency? N/a  ?Has the agency set up a time to come to the patient's home? not applicable ?Were any new equipment or medical supplies ordered?  No ?What is the name of the medical supply agency? N/a ?Were you able to get the supplies/equipment? not applicable ?Do you have any questions related to the use of the equipment or supplies? No ? ?Functional Questionnaire: (I = Independent and D = Dependent) ?ADLs: I ? ?Bathing/Dressing- I ? ?Meal Prep- I ? ?Eating- I ? ?Maintaining continence- I ? ?Transferring/Ambulation- I ? ?Managing Meds- I ? ?Follow up appointments reviewed: ? ?PCP Hospital f/u appt confirmed? No   ?Specialist Hospital f/u appt confirmed? Yes  Scheduled to see Dr. Gwenlyn Found on 08/21/2021 @ 8:30. ?Are transportation arrangements needed? No  ?If their condition worsens, is the pt aware to call PCP or go to the Emergency Dept.? Yes ?Was the patient provided with contact information for the PCP's office or ED? Yes ?Was to pt encouraged to call back with questions or concerns? Yes ? ?

## 2021-08-20 NOTE — Telephone Encounter (Signed)
Raymond [name] w/ 3 M is calling([phone],ext. [phone]) to inform that they are unable to reach patient to deliver his medicine. ?Returned call back to give patient's wife number to call. ?

## 2021-08-21 ENCOUNTER — Other Ambulatory Visit: Payer: Self-pay

## 2021-08-21 ENCOUNTER — Telehealth: Payer: Self-pay | Admitting: Sports Medicine

## 2021-08-21 ENCOUNTER — Ambulatory Visit (INDEPENDENT_AMBULATORY_CARE_PROVIDER_SITE_OTHER): Payer: Self-pay | Admitting: Cardiovascular Disease

## 2021-08-21 ENCOUNTER — Encounter: Payer: Self-pay | Admitting: Cardiovascular Disease

## 2021-08-21 DIAGNOSIS — I70229 Atherosclerosis of native arteries of extremities with rest pain, unspecified extremity: Secondary | ICD-10-CM

## 2021-08-21 DIAGNOSIS — Z9889 Other specified postprocedural states: Secondary | ICD-10-CM

## 2021-08-21 MED ORDER — CLONIDINE 0.3 MG/24HR TD PTWK: 0.3000 mg | MEDICATED_PATCH | TRANSDERMAL | 12 refills | Status: DC

## 2021-08-21 NOTE — Progress Notes (Signed)
? ? ? ?08/21/2021 ?Raymond [name]   ?1960/07/01  ?[id number] ? ?Primary Physician [name], [place] ?Primary Cardiologist: Lorretta Harp MD [name], [place] ? ?HPI:  Raymond [name] is a 61 y.o.  moderately overweight separated African-American male father of 2 children, grandfather of 2 grandchildren who I last saw in the office 07/26/2021.[name]  He was was referred by Dr. Jacqualyn Posey, his podiatrist for peripheral vascular valuation because of a small ulcer on the plantar surface of his right first metatarsal.  He has a history of treated hypertension, diabetes and hyperlipidemia.  His father had an MI at age 59 and his brother recently had a myocardial infarction as well.  He is never had a heart attack or stroke.  He denies chest pain or shortness of breath.  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  He apparently stepped on a nail approximately 2 to 3 months ago and developed a wound on the plantar surface of his right first metatarsal which Dr. Jacqualyn Posey has been treating fairly frequently over the last several months.  Apparently this is slowly healing.  He had Dopplers performed in [place] recently that were told showed normal circulation.  Since I saw him 6 months ago the wound on his right foot is healed.  He is developed a new wound on his left foot with recent Dopplers that showed a left TBI 0.28, monophasic waveforms in his left SFA with tibial vessel disease, occluded anterior tibial and peroneal artery.  I suspect he will need a amputation of that toe since he has documented osteomyelitis according to Dr. Jacqualyn Posey.  Is unclear whether this will heal but I suspect based on his Dopplers that this will be difficult.  I am also concerned that angiography will lead to radiocontrast nephropathy and jeopardize his transplanted kidney. ?  ?He saw Dr. Jacqualyn Posey  who felt that his wound was unlikely  to heal and that he would require amputation.  Dr. Jacqualyn Posey spoke to the patient's nephrologist echoing my concern about exposure to radiocontrast in the setting of moderate renal insufficiency status post renal artery transplant and apparently the nephrologist felt comfortable proceeding with peripheral angiography potential intervention. ?  ?When I saw him a month ago his right lower extremity wound on his foot was healing however he was admitted on 08/14/2021 with soft tissue swelling and progression of his wound.  Osteomyelitis was ruled out by MRI.  Dr. Cannon Kettle performed and sent incision and drainage with debridement and wound culture and he was discharged home on Augmentin. ? ? ?Current Meds  ?Medication Sig  ? amoxicillin-clavulanate (AUGMENTIN) 875-125 MG tablet Take 1 tablet by mouth 2 (two) times daily for 14 days.  ? atorvastatin (LIPITOR) 10 MG tablet Take 10 mg by mouth daily.  ? blood glucose meter kit and supplies KIT Dispense based on patient and insurance preference. Use up to four times daily as directed. (FOR ICD-9 250.00, 250.01).  ? Blood Glucose Monitoring Suppl (ACCU-CHEK GUIDE ME) w/Device KIT 1 Piece by Does not apply route as directed.  ? brimonidine (ALPHAGAN) 0.2 % ophthalmic solution Place 1 drop into both eyes 3 (three) times daily.  ? cloNIDine (CATAPRES - DOSED IN MG/24 HR) 0.3 mg/24hr patch Place 0.3 mg onto the skin once a week.  ? colchicine 0.6 MG tablet Take 0.6 mg by mouth daily as needed (gout).  ? dorzolamide-timolol (COSOPT) 22.3-6.8  MG/ML ophthalmic solution Place 1 drop into both eyes 2 (two) times daily.  ? doxazosin (CARDURA) 4 MG tablet Take 4 mg by mouth daily.  ? Dulaglutide (TRULICITY) 3.90 ZE/0.9QZ SOPN Inject 0.75 mg into the skin once a week.  ? glucose blood (ACCU-CHEK GUIDE) test strip Use as instructed 4 x daily. E11.65  ? hydrALAZINE (APRESOLINE) 25 MG tablet Take 25 mg by mouth 2 (two) times daily.  ? HYDROcodone-acetaminophen (NORCO/VICODIN) 5-325 MG tablet  Take 1 tablet by mouth every 4 (four) hours as needed for moderate pain.  ? Insulin NPH, Human,, Isophane, (NOVOLIN N FLEXPEN) 100 UNIT/ML Kiwkpen Inject 50 Units into the skin every morning. (Patient taking differently: Inject 60 Units into the skin in the morning and at bedtime.)  ? latanoprost (XALATAN) 0.005 % ophthalmic solution Place 1 drop into both eyes at bedtime.  ? Multiple Vitamin (MULTIVITAMIN) capsule Take 1 capsule by mouth daily.  ? sodium bicarbonate 650 MG tablet Take 1,300 mg by mouth 3 (three) times daily.  ? sulfamethoxazole-trimethoprim (BACTRIM DS) 800-160 MG tablet Take 1 tablet by mouth 2 (two) times daily for 14 days.  ? tacrolimus (PROGRAF) 1 MG capsule Take 3-4 mg by mouth See admin instructions. Take 3 capsules (68m) in AM & take 4 capsules (429m in PM  ? Vitamin D, Ergocalciferol, (DRISDOL) 50000 units CAPS capsule Take 50,000 Units by mouth every 30 (thirty) days.  ?  ? ?No Known Allergies ? ?Social History  ? ?Socioeconomic History  ? Marital status: Married  ?  Spouse name: Not on file  ? Number of children: Not on file  ? Years of education: Not on file  ? Highest education level: Not on file  ?Occupational History  ? Not on file  ?Tobacco Use  ? Smoking status: Never  ? Smokeless tobacco: Never  ?Vaping Use  ? Vaping Use: Never used  ?Substance and Sexual Activity  ? Alcohol use: No  ?  Alcohol/week: 0.0 standard drinks  ? Drug use: No  ? Sexual activity: Not on file  ?Other Topics Concern  ? Not on file  ?Social History Narrative  ? ** Merged History Encounter **  ?    ? ?Social Determinants of Health  ? ?Financial Resource Strain: Not on file  ?Food Insecurity: Not on file  ?Transportation Needs: Not on file  ?Physical Activity: Not on file  ?Stress: Not on file  ?Social Connections: Not on file  ?Intimate Partner Violence: Not on file  ?  ? ?Review of Systems: ?General: negative for chills, fever, night sweats or weight changes.  ?Cardiovascular: negative for chest pain, dyspnea  on exertion, edema, orthopnea, palpitations, paroxysmal nocturnal dyspnea or shortness of breath ?Dermatological: negative for rash ?Respiratory: negative for cough or wheezing ?Urologic: negative for hematuria ?Abdominal: negative for nausea, vomiting, diarrhea, bright red blood per rectum, melena, or hematemesis ?Neurologic: negative for visual changes, syncope, or dizziness ?All other systems reviewed and are otherwise negative except as noted above. ? ? ? ?Blood pressure (!) 142/80, pulse 65, height '5\' 7"'  (1.702 m), weight 222 lb 12.8 oz (101.1 kg), SpO2 100 %.  ?General appearance: alert and no distress ?Neck: no adenopathy, no carotid bruit, no JVD, supple, symmetrical, trachea midline, and thyroid not enlarged, symmetric, no tenderness/mass/nodules ?Lungs: clear to auscultation bilaterally ?Heart: regular rate and rhythm, S1, S2 normal, no murmur, click, rub or gallop ?Extremities: extremities normal, atraumatic, no cyanosis or edema ?Pulses: Right foot is wrapped ?Skin: Right foot is bandaged ?Neurologic: Grossly normal ? ?  EKG not performed today ? ?ASSESSMENT AND PLAN:  ? ?Critical limb ischemia with history of revascularization of same extremity ?Raymond [name] returns today after last being seen approximately 1 month ago.  At that time his wound on his right foot was healing however he was recently admitted to the hospital on 08/14/2021 for infected wound on his right foot which was debrided by Dr. Cannon Kettle.  He is currently on antibiotics.  His serum creatinine runs in the 1.1-1.4 range.  He does have history of renal transplantation.  I believe it would be in his best interest to undergo angiography with limited dye and potential endovascular therapy of his tibial vessels for limb salvage.  I discussed this with Dr. Fletcher Anon who agrees to do this next Wednesday. ? ? ? ? ?Lorretta Harp MD FACP,FACC,FAHA, FSCAI ?08/21/2021 ?9:20 AM ?

## 2021-08-21 NOTE — Assessment & Plan Note (Signed)
[name] returns today after last being seen approximately 1 month ago.  At that time his wound on his right foot was healing however he was recently admitted to the hospital on 08/14/2021 for infected wound on his right foot which was debrided by Dr. Cannon Kettle.  He is currently on antibiotics.  His serum creatinine runs in the 1.1-1.4 range.  He does have history of renal transplantation.  I believe it would be in his best interest to undergo angiography with limited dye and potential endovascular therapy of his tibial vessels for limb salvage.  I discussed this with Dr. Fletcher Anon who agrees to do this next Wednesday. ?

## 2021-08-21 NOTE — Patient Instructions (Signed)
Medication Instructions:  ?Your physician recommends that you continue on your current medications as directed. Please refer to the Current Medication list given to you today. ? ?*If you need a refill on your cardiac medications before your next appointment, please call your pharmacy* ? ? ?Lab Work: ?Your physician recommends that you have labs drawn today: BMET and CBC ? ?If you have labs (blood work) drawn today and your tests are completely normal, you will receive your results only by: ?MyChart Message (if you have MyChart) OR ?A paper copy in the mail ?If you have any lab test that is abnormal or we need to change your treatment, we will call you to review the results. ? ? ?Testing/Procedures: ?Your physician has requested that you have a lower extremity arterial duplex. This test is an ultrasound of the arteries in the legs. It looks at arterial blood flow in the legs. Allow one hour for Lower Arterial scans. There are no restrictions or special instructions ? ?Your physician has requested that you have an ankle brachial index (ABI). During this test an ultrasound and blood pressure cuff are used to evaluate the arteries that supply the arms and legs with blood. Allow thirty minutes for this exam. There are no restrictions or special instructions. ?To be done 1 week after procedure on 4/5. These will be done at [place]. [address] ? ? ?Follow-Up: ?At [facility], you and your health needs are our priority.  As part of our continuing mission to provide you with exceptional heart care, we have created designated Provider Care Teams.  These Care Teams include your primary Cardiologist (physician) and Advanced Practice Providers (APPs -  Physician Assistants and Nurse Practitioners) who all work together to provide you with the care you need, when you need it. ? ?We recommend signing up for the patient portal called "MyChart".  Sign up information is provided on this After Visit Summary.  MyChart is  used to connect with patients for Virtual Visits (Telemedicine).  Patients are able to view lab/test results, encounter notes, upcoming appointments, etc.  Non-urgent messages can be sent to your provider as well.   ?To learn more about what you can do with MyChart, go to [email or web address].   ? ?Your next appointment:   ?2-3 week(s) ? ?The format for your next appointment:   ?In Person ? ?Provider:   ?Dr. Fletcher Anon ? ?Other Instructions ? ?[place] [facility] ?[address] ?[place] [address] ?Dept: [phone] ?Loc: [phone] ? ?Antrone Walla  08/21/2021 ? ?You are scheduled for a Peripheral Angiogram on Wednesday, April 5 with Dr. [name]. ? ?1. Please arrive at the Main Entrance A at [facility]: House, [place] [address] at 12:00 PM (This time is two hours before your procedure to ensure your preparation). Free valet parking service is available.  ? ?Special note: Every effort is made to have your procedure done on time. Please understand that emergencies sometimes delay scheduled procedures. ? ?2. Diet: Do not eat solid foods after midnight.  You may have clear liquids until 5 AM upon the day of the procedure. ? ?3. Labs: You will need to have blood drawn today. ? ?4. Medication instructions in preparation for your procedure: ? ?Do NOT take any insulin on day of procedure. ? ? ?On the morning of your procedure, take Aspirin and any morning medicines NOT listed above.  You may use sips of water. ? ?  5. Plan to go home the same day, you will only stay overnight if medically necessary. ?6. You MUST have a responsible adult to drive you home. ?7. An adult MUST be with you the first 24 hours after you arrive home. ?8. Bring a current list of your medications, and the last time and date medication taken. ?9. Bring ID and current insurance cards. ?10.Please wear clothes that are easy to get on and  off and wear slip-on shoes. ? ?Thank you for allowing [place] to care for you! ?  -- [facility]  ? ?

## 2021-08-21 NOTE — Telephone Encounter (Signed)
Patient called and stated that he has received his wound vac and he was suppose to call to notify Dr. Cannon Kettle. ?

## 2021-08-22 ENCOUNTER — Encounter (HOSPITAL_BASED_OUTPATIENT_CLINIC_OR_DEPARTMENT_OTHER): Payer: Self-pay | Attending: General Surgery | Admitting: General Surgery

## 2021-08-22 ENCOUNTER — Other Ambulatory Visit: Payer: Self-pay | Admitting: Cardiology

## 2021-08-22 ENCOUNTER — Telehealth: Payer: Self-pay | Admitting: Cardiovascular Disease

## 2021-08-22 ENCOUNTER — Ambulatory Visit (INDEPENDENT_AMBULATORY_CARE_PROVIDER_SITE_OTHER): Payer: Self-pay | Admitting: Sports Medicine

## 2021-08-22 ENCOUNTER — Other Ambulatory Visit: Payer: Self-pay

## 2021-08-22 ENCOUNTER — Encounter: Payer: Self-pay | Admitting: *Deleted

## 2021-08-22 ENCOUNTER — Ambulatory Visit (INDEPENDENT_AMBULATORY_CARE_PROVIDER_SITE_OTHER): Payer: Self-pay

## 2021-08-22 DIAGNOSIS — L97419 Non-pressure chronic ulcer of right heel and midfoot with unspecified severity: Secondary | ICD-10-CM | POA: Insufficient documentation

## 2021-08-22 DIAGNOSIS — M8600 Acute hematogenous osteomyelitis, unspecified site: Secondary | ICD-10-CM

## 2021-08-22 DIAGNOSIS — E875 Hyperkalemia: Secondary | ICD-10-CM

## 2021-08-22 DIAGNOSIS — E11319 Type 2 diabetes mellitus with unspecified diabetic retinopathy without macular edema: Secondary | ICD-10-CM | POA: Insufficient documentation

## 2021-08-22 DIAGNOSIS — E0843 Diabetes mellitus due to underlying condition with diabetic autonomic (poly)neuropathy: Secondary | ICD-10-CM

## 2021-08-22 DIAGNOSIS — I739 Peripheral vascular disease, unspecified: Secondary | ICD-10-CM

## 2021-08-22 DIAGNOSIS — E1140 Type 2 diabetes mellitus with diabetic neuropathy, unspecified: Secondary | ICD-10-CM | POA: Insufficient documentation

## 2021-08-22 DIAGNOSIS — M86171 Other acute osteomyelitis, right ankle and foot: Secondary | ICD-10-CM

## 2021-08-22 DIAGNOSIS — L97512 Non-pressure chronic ulcer of other part of right foot with fat layer exposed: Secondary | ICD-10-CM

## 2021-08-22 DIAGNOSIS — L97529 Non-pressure chronic ulcer of other part of left foot with unspecified severity: Secondary | ICD-10-CM | POA: Insufficient documentation

## 2021-08-22 DIAGNOSIS — E1151 Type 2 diabetes mellitus with diabetic peripheral angiopathy without gangrene: Secondary | ICD-10-CM | POA: Insufficient documentation

## 2021-08-22 DIAGNOSIS — E11621 Type 2 diabetes mellitus with foot ulcer: Secondary | ICD-10-CM | POA: Insufficient documentation

## 2021-08-22 DIAGNOSIS — I70229 Atherosclerosis of native arteries of extremities with rest pain, unspecified extremity: Secondary | ICD-10-CM | POA: Insufficient documentation

## 2021-08-22 DIAGNOSIS — L97513 Non-pressure chronic ulcer of other part of right foot with necrosis of muscle: Secondary | ICD-10-CM | POA: Insufficient documentation

## 2021-08-22 DIAGNOSIS — I10 Essential (primary) hypertension: Secondary | ICD-10-CM | POA: Insufficient documentation

## 2021-08-22 DIAGNOSIS — Z79899 Other long term (current) drug therapy: Secondary | ICD-10-CM

## 2021-08-22 LAB — CBC
Hematocrit: 33.8 % — ABNORMAL LOW (ref 37.5–51.0)
Hemoglobin: 9.9 g/dL — ABNORMAL LOW (ref 13.0–17.7)
MCH: 24.6 pg — ABNORMAL LOW (ref 26.6–33.0)
MCHC: 29.3 g/dL — ABNORMAL LOW (ref 31.5–35.7)
MCV: 84 fL (ref 79–97)
Platelets: 287 10*3/uL (ref 150–450)
RBC: 4.03 x10E6/uL — ABNORMAL LOW (ref 4.14–5.80)
RDW: 13.6 % (ref 11.6–15.4)
WBC: 8.2 10*3/uL (ref 3.4–10.8)

## 2021-08-22 LAB — BASIC METABOLIC PANEL
BUN/Creatinine Ratio: 18 (ref 10–24)
BUN/Creatinine Ratio: 17 (ref 10–24)
BUN: 27 mg/dL (ref 8–27)
BUN: 27 mg/dL (ref 8–27)
CO2: 24 mmol/L (ref 20–29)
CO2: 25 mmol/L (ref 20–29)
Calcium: 9.5 mg/dL (ref 8.6–10.2)
Calcium: 9.7 mg/dL (ref 8.6–10.2)
Chloride: 96 mmol/L (ref 96–106)
Chloride: 103 mmol/L (ref 96–106)
Creatinine, Ser: 1.53 mg/dL — ABNORMAL HIGH (ref 0.76–1.27)
Creatinine, Ser: 1.59 mg/dL — ABNORMAL HIGH (ref 0.76–1.27)
Glucose: 498 mg/dL — ABNORMAL HIGH (ref 70–99)
Glucose: 227 mg/dL — ABNORMAL HIGH (ref 70–99)
Potassium: 6.3 mmol/L (ref 3.5–5.2)
Potassium: 6 mmol/L (ref 3.5–5.2)
Sodium: 131 mmol/L — ABNORMAL LOW (ref 134–144)
Sodium: 137 mmol/L (ref 134–144)
eGFR: 52 mL/min/{1.73_m2} — ABNORMAL LOW (ref >59)
eGFR: 49 mL/min/{1.73_m2} — ABNORMAL LOW (ref >59)

## 2021-08-22 MED ORDER — LOKELMA 10 G PO PACK: 10.0000 g | PACK | Freq: Once | ORAL | 0 refills | Status: AC

## 2021-08-22 MED ORDER — HYDROCODONE-ACETAMINOPHEN 5-325 MG PO TABS: 1.0000 | ORAL_TABLET | ORAL | 0 refills | Status: DC | PRN

## 2021-08-22 NOTE — Progress Notes (Signed)
Received called from [facility] regarding critical lab from outpatient visit yesterday. BMET with K+ 6.3 on verbal report, Cr 1.53. He is not on K+ supplement, ARB/ACEi/spiro etc. Suspect this is inaccurate. Called patient to inform he will need repeat labs today to reassess. He was agreeable, will come this morning for re-draw. Updated ordered provider of plan. New orders placed for BMET.   ?

## 2021-08-22 NOTE — Telephone Encounter (Signed)
error 

## 2021-08-22 NOTE — Progress Notes (Signed)
Subjective: ?Raymond [name] is a 61 y.o. male patient seen today in office for POV #1 in office status post right foot incision and drainage plantar wound performed on 08/15/21 and later bedside lancing of blisters that developed subsequently a few days later after original procedure at the right fourth toe.  Patient reports that earlier today he went to see his primary doctor and also went to the wound center of which the wound center said to continue follow-up with surgeon.  Patient reports that he got his wound VAC and has brought in the materials for me to apply.  Patient denies current nausea vomiting fever or chills.  Denies any current pain.  Patient reports that he is taking his Augmentin and also picked up his Bactrim antibiotic as previously prescribed.  Patient is also scheduled with vascular for intervention on 08/28/2021. ? ?Patient is diabetic blood sugar not recorded at this visit. ? ?Patient is assisted by wife this visit. ? ?Patient Active Problem List  ? Diagnosis Date Noted  ? Diabetic foot ulcer ([place]) 08/14/2021  ? CKD (chronic kidney disease) stage 3, GFR 30-59 ml/min (HCC) 08/14/2021  ? Hypomagnesemia 06/25/2021  ? Septic arthritis of elbow, left ([place]) 06/24/2021  ? Peripheral vascular disease ([place]) 04/24/2020  ? Pyogenic inflammation of bone (HCC)   ? Thrombophlebitis of superficial veins of right lower extremity   ? Difficult intravenous access   ? MRSA bacteremia   ? Diabetic foot infection ([place])   ? Infected wound 01/12/2020  ? Critical lower limb ischemia ([place]) 02/02/2019  ? Personal history of noncompliance with medical treatment, presenting hazards to health 10/07/2018  ? Critical limb ischemia with history of revascularization of same extremity ([place]) 07/27/2018  ? Mixed hyperlipidemia 02/19/2018  ? History of renal transplant 02/19/2018  ? Conductive hearing loss, bilateral 08/31/2017  ? Nuclear sclerotic cataract of right eye 10/07/2016  ? Pseudophakia of left eye 10/07/2016  ?  Hypertension due to endocrine disorder 03/04/2016  ? 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 Renal failure 03/04/2016  ? Proliferative diabetic retinopathy with macular edema associated with type 2 diabetes mellitus ([place]) 03/04/2016  ? Immunosuppression ([place]) 12/10/2015  ? Nausea vomiting and diarrhea 12/10/2015  ? Type 2 diabetes mellitus ([place]) 12/10/2015  ? Essential hypertension, benign 12/10/2015  ? Pancytopenia ([place]) 12/10/2015  ? Encounter for screening colonoscopy 11/15/2014  ? GERD (gastroesophageal reflux disease) 11/15/2014  ? ? ?Current Outpatient Medications on File Prior to Visit  ?Medication Sig Dispense Refill  ? amoxicillin-clavulanate (AUGMENTIN) 875-125 MG tablet Take 1 tablet by mouth 2 (two) times daily for 14 days. 28 tablet 0  ? atorvastatin (LIPITOR) 10 MG tablet Take 10 mg by mouth daily.    ? blood glucose meter kit and supplies KIT Dispense based on patient and insurance preference. Use up to four times daily as directed. (FOR ICD-9 250.00, 250.01). 1 each 0  ? Blood Glucose Monitoring Suppl (ACCU-CHEK GUIDE ME) w/Device KIT 1 Piece by Does not apply route as directed. 1 kit 0  ? brimonidine (ALPHAGAN) 0.2 % ophthalmic solution Place 1 drop into both eyes 3 (three) times daily.    ? cloNIDine (CATAPRES - DOSED IN MG/24 HR) 0.3 mg/24hr patch Place 1 patch (0.3 mg total) onto the skin once a week. 4 patch 12  ? colchicine 0.6 MG tablet Take 0.6 mg by mouth daily as needed (gout).    ? dorzolamide-timolol (COSOPT) 22.3-6.8 MG/ML ophthalmic solution  Place 1 drop into both eyes 2 (two) times daily.    ? doxazosin (CARDURA) 4 MG tablet Take 4 mg by mouth daily.    ? Dulaglutide (TRULICITY) 3.29 JJ/8.8CZ SOPN Inject 0.75 mg into the skin once a week. 6 mL 3  ? glucose blood (ACCU-CHEK GUIDE) test strip Use as instructed 4 x daily. E11.65 150 each 5  ? hydrALAZINE (APRESOLINE) 25 MG tablet Take 25 mg by mouth 2  (two) times daily.    ? HYDROcodone-acetaminophen (NORCO/VICODIN) 5-325 MG tablet Take 1 tablet by mouth every 4 (four) hours as needed for moderate pain. 20 tablet 0  ? Insulin NPH, Human,, Isophane, (NOVOLIN N FLEXPEN) 100 UNIT/ML Kiwkpen Inject 50 Units into the skin every morning. (Patient taking differently: Inject 60 Units into the skin in the morning and at bedtime.) 105 mL 3  ? latanoprost (XALATAN) 0.005 % ophthalmic solution Place 1 drop into both eyes at bedtime.    ? Multiple Vitamin (MULTIVITAMIN) capsule Take 1 capsule by mouth daily.    ? sodium bicarbonate 650 MG tablet Take 1,300 mg by mouth 3 (three) times daily.    ? sulfamethoxazole-trimethoprim (BACTRIM DS) 800-160 MG tablet Take 1 tablet by mouth 2 (two) times daily for 14 days. 28 tablet 0  ? tacrolimus (PROGRAF) 1 MG capsule Take 3-4 mg by mouth See admin instructions. Take 3 capsules (43m) in AM & take 4 capsules (451m in PM    ? Vitamin D, Ergocalciferol, (DRISDOL) 50000 units CAPS capsule Take 50,000 Units by mouth every 30 (thirty) days.    ? ?No current facility-administered medications on file prior to visit.  ? ? ?No Known Allergies ? ?Objective: ?There were no vitals filed for this visit. ? ?General: No acute distress, AAOx3  ?Right foot: There is a full-thickness granular wound to the plantar aspect of the right foot that measures 8 x 4 cm with exposed tendon at area of irrigation and debridement site.  There is very minimal bloody drainage, no malodor, no significant periwound erythema or edema however to the dorsum of the foot there is some mild soft tissue swelling purple discoloration at the dorsum of the foot especially at the right second and third toes there is significant macerated and friable skin and deroofed peeling skin noted especially at the right fourth toe.  The right fourth toe does have slight delayed capillary fill time.  DP and PT pedal pulses are diminished.  No pain to palpation to lower leg or  calf. ? ?Assessment and Plan:  ?Problem List Items Addressed This Visit   ? ?  ? Musculoskeletal and Integument  ? Pyogenic inflammation of bone (HCC)  ? ?Other Visit Diagnoses   ? ? Foot ulcer, right, with fat layer exposed ([facility]   -  Primary  ? Relevant Orders  ? DG Foot Complete Right  ? Osteomyelitis of ankle or foot, acute, right ([facility]     ? Relevant Orders  ? DG Foot Complete Right  ? PAD (peripheral artery disease) ([facility]     ? Diabetes mellitus due to underlying condition with diabetic autonomic neuropathy, unspecified whether long term insulin use ([facility]     ? ?  ?  ? ?-Patient seen and evaluated ?-Applied wound VAC to the right plantar wound with suction and seal intact and advised patient to return to office weekly for wound VAC dressing changes until he can get insurance to have home nursing.  I also applied Betadine and dry  gauze dressing to the right third and fourth toes and advised patient and wife that they can change this daily and closely monitor the toes may continue to notice that some of the skin may slough off however if there is purulent drainage or necrosis/continuing darkening of the toe to please notify office soon as possible  ?-Advised patient to continue with rest and elevation for pain and edema control  ?-Continue with weightbearing only to heel with surgical shoe  ?-Advised patient to continue with oral antibiotic of Augmentin and Bactrim previous OR wound culture reviewed  ?-Patient to continue closely with vascular follow-up as scheduled for aortogram on 08/28/2021 with Dr. Fletcher Anon ?-At this time advised patient he does not need follow-up with wound center until we have further get his in section under control and make sure there is no more additional surgical needs at this time we are hoping that after his vascular procedure the right third and fourth toes will improve however patient is well aware that he is at high risk of complication or amputation or further need for  surgery. ?-Patient is supported today by wife who reports that they will keep a close eye on the foot and she a nurse and her family that also can help monitor the wounds and help with dressing changes. ?-Patient to return as scheduled o

## 2021-08-22 NOTE — Telephone Encounter (Signed)
Called [facility] pt has critical potassium level 6.0. ?Per DOD-Dr Percival Spanish Lokelma 10 g once. Pt notified of increased potassium. He will go to the pharmacy and take Lokelma 10g once and return for lab re-draw tomorrow to check potassium status.  ?New rx entered, future lab entered ?

## 2021-08-22 NOTE — Progress Notes (Signed)
Raymond [name] ([id number]) ?Visit Report for 08/22/2021 ?Abuse Risk Screen Details ?Patient Name: Date of Service: ?[name], [name] 08/22/2021 8:00 A M ?Medical Record Number: [id number] ?Patient Account Number: [id number] ?Date of Birth/Sex: Treating RN: ?04-Mar-1961 (61 y.o. Raymond [name]) Dellie Catholic ?Primary Care Roann Merk: [name] Other Clinician: ?Referring Keyvin Rison: ?Treating Lear Carstens/Extender: Fredirick Maudlin ?[name] ?Weeks in Treatment: 0 ?Abuse Risk Screen Items ?Answer ?ABUSE RISK SCREEN: ?Has anyone close to you tried to hurt or harm you recentlyo No ?Do you feel uncomfortable with anyone in your familyo No ?Has anyone forced you do things that you didnt want to doo No ?Electronic Signature(s) ?Signed: 08/22/2021 10:45:00 AM By: Dellie Catholic RN ?Entered By: Dellie Catholic on 08/22/2021 08:33:48 ?-------------------------------------------------------------------------------- ?Activities of Daily Living Details ?Patient Name: Date of Service: ?[name], [name] 08/22/2021 8:00 A M ?Medical Record Number: [id number] ?Patient Account Number: [id number] ?Date of Birth/Sex: Treating RN: ?[birth year] (61 y.o. Raymond [name]) Dellie Catholic ?Primary Care Fawzi Melman: [name] Other Clinician: ?Referring Haile Toppins: ?Treating Kaylon Laroche/Extender: Fredirick Maudlin ?[name] ?Weeks in Treatment: 0 ?Activities of Daily Living Items ?Answer ?Activities of Daily Living (Please select one for each item) ?Drive Automobile Not Able ?T Medications ?ake Completely Able ?Use T elephone Completely Able ?Care for Appearance Completely Able ?Use T oilet Completely Able ?Bath / Shower Completely Able ?Dress Self Completely Able ?Feed Self Completely Able ?Walk Completely Able ?Get In / Out Bed Completely Able ?Housework Completely Able ?Prepare Meals Completely Able ?Handle Money Completely Able ?Shop for Self Completely Able ?Electronic Signature(s) ?Signed: 08/22/2021 10:45:00 AM By: Dellie Catholic RN ?Entered By: Dellie Catholic on 08/22/2021 08:41:51 ?-------------------------------------------------------------------------------- ?Education Screening Details ?Patient Name: ?Date of Service: ?[name], [name] 08/22/2021 8:00 A M ?Medical Record Number: [id number] ?Patient Account Number: [id number] ?Date of Birth/Sex: ?Treating RN: ?[birth year] (62 y.o. Raymond [name]) Dellie Catholic ?Primary Care Dock Baccam: [name] ?Other Clinician: ?Referring Khilynn Borntreger: ?Treating Makina Skow/Extender: Fredirick Maudlin ?[name] ?Weeks in Treatment: 0 ?Learning Preferences/Education Level/Primary Language ?Learning Preference: Explanation, Demonstration, Printed Material ?Highest Education Level: High School ?Preferred Language: English ?Cognitive Barrier ?Language Barrier: No ?Translator Needed: No ?Memory Deficit: No ?Emotional Barrier: No ?Cultural/Religious Beliefs Affecting Medical Care: No ?Physical Barrier ?Impaired Vision: No ?Impaired Hearing: No ?Decreased Hand dexterity: No ?Knowledge/Comprehension ?Knowledge Level: High ?Comprehension Level: High ?Ability to understand written instructions: High ?Ability to understand verbal instructions: High ?Motivation ?Anxiety Level: Calm ?Cooperation: Cooperative ?Education Importance: Acknowledges Need ?Interest in Health Problems: Asks Questions ?Perception: Coherent ?Willingness to Engage in Self-Management High ?Activities: ?Readiness to Engage in Self-Management High ?Activities: ?Electronic Signature(s) ?Signed: 08/22/2021 10:45:00 AM By: Dellie Catholic RN ?Entered By: Dellie Catholic on 08/22/2021 08:42:57 ?-------------------------------------------------------------------------------- ?Fall Risk Assessment Details ?Patient Name: ?Date of Service: ?[name], [name] 08/22/2021 8:00 A M ?Medical Record Number: [id number] ?Patient Account Number: [id number] ?Date of Birth/Sex: ?Treating RN: ?12/27/1960 (61 y.o. Raymond [name]) Dellie Catholic ?Primary Care Cyara Devoto: [name] ?Other Clinician: ?Referring  Hakop Humbarger: ?Treating Jaelene Garciagarcia/Extender: Fredirick Maudlin ?[name] ?Weeks in Treatment: 0 ?Fall Risk Assessment Items ?Have you had 2 or more falls in the last 12 monthso 0 No ?Have you had any fall that resulted in injury in the last 12 monthso 0 No ?FALLS RISK SCREEN ?History of falling - immediate or within 3 months 0 No ?Secondary diagnosis (Do you have 2 or more medical diagnoseso) 0 No ?Ambulatory aid ?None/bed rest/wheelchair/nurse 0 No ?Crutches/cane/walker 15 Yes ?Furniture 0 No ?Intravenous therapy Access/Saline/Heparin Lock 0 No ?Gait/Transferring ?Normal/ bed rest/ wheelchair 0 No ?Weak (short steps with  or without shuffle, stooped but able to lift head while walking, may seek 0 No ?support from furniture) ?Impaired (short steps with shuffle, may have difficulty arising from chair, head down, impaired 0 No ?balance) ?Mental Status ?Oriented to own ability 0 No ?Electronic Signature(s) ?Signed: 08/22/2021 10:45:00 AM By: Dellie Catholic RN ?Entered By: Dellie Catholic on 08/22/2021 08:43:54 ?-------------------------------------------------------------------------------- ?Foot Assessment Details ?Patient Name: ?Date of Service: ?[name], [name] 08/22/2021 8:00 A M ?Medical Record Number: [id number] ?Patient Account Number: [id number] ?Date of Birth/Sex: ?Treating RN: ?Jun 26, 1960 (61 y.o. Raymond [name]) Dellie Catholic ?Primary Care Shaley Leavens: [name] ?Other Clinician: ?Referring Avangelina Flight: ?Treating Arnell Mausolf/Extender: Fredirick Maudlin ?[name] ?Weeks in Treatment: 0 ?Foot Assessment Items ?Site Locations ?+ = Sensation present, - = Sensation absent, C = Callus, U = Ulcer ?R = Redness, W = Warmth, M = Maceration, PU = Pre-ulcerative lesion ?F = Fissure, S = Swelling, D = Dryness ?Assessment ?Right: Left: ?Other Deformity: No No ?Prior Foot Ulcer: No No ?Prior Amputation: No No ?Charcot Joint: No No ?Ambulatory Status: Ambulatory With Help ?Assistance Device: Walker ?Gait: Steady ?Electronic  Signature(s) ?Signed: 08/22/2021 10:45:00 AM By: Dellie Catholic RN ?Entered By: Dellie Catholic on 08/22/2021 08:46:44 ?-------------------------------------------------------------------------------- ?Nutrition Risk Screening Details ?Patient Name: ?Date of Service: ?[name], [name] 08/22/2021 8:00 A M ?Medical Record Number: [id number] ?Patient Account Number: [id number] ?Date of Birth/Sex: ?Treating RN: ?[birth year] (61 y.o. Raymond [name]) Dellie Catholic ?Primary Care Jannice Beitzel: [name] ?Other Clinician: ?Referring Sevin Farone: ?Treating Iviona Hole/Extender: Fredirick Maudlin ?[name] ?Weeks in Treatment: 0 ?Height (in): 67 ?Weight (lbs): 217 ?Body Mass Index (BMI): 34 ?Nutrition Risk Screening Items ?Score Screening ?NUTRITION RISK SCREEN: ?I have an illness or condition that made me change the kind and/or amount of food I eat 0 No ?I eat fewer than two meals per day 0 No ?I eat few fruits and vegetables, or milk products 0 No ?I have three or more drinks of beer, liquor or wine almost every day 0 No ?I have tooth or mouth problems that make it hard for me to eat 0 No ?I don't always have enough money to buy the food I need 0 No ?I eat alone most of the time 0 No ?I take three or more different prescribed or over-the-counter drugs a day 1 Yes ?Without wanting to, I have lost or gained 10 pounds in the last six months 0 No ?I am not always physically able to shop, cook and/or feed myself 0 No ?Nutrition Protocols ?Good Risk Protocol 0 No interventions needed ?Moderate Risk Protocol ?High Risk Proctocol ?Risk Level: Good Risk ?Score: 1 ?Electronic Signature(s) ?Signed: 08/22/2021 10:45:00 AM By: Dellie Catholic RN ?Entered By: Dellie Catholic on 08/22/2021 08:44:51 ?

## 2021-08-22 NOTE — Progress Notes (Signed)
Raymond [name], Raymond [name] ([id number]) ?Visit Report for 08/22/2021 ?Chief Complaint Document Details ?Patient Name: Date of Service: ?[name], [name] 08/22/2021 8:00 A M ?Medical Record Number: [id number] ?Patient Account Number: [id number] ?Date of Birth/Sex: Treating RN: ?[birth year] (61 y.o. M) ?Primary Care Provider: [name] Other Clinician: ?Referring Provider: ?Treating Provider/Extender: Fredirick Maudlin ?[name] ?Weeks in Treatment: 0 ?Information Obtained from: Patient ?Chief Complaint ?02/21/2019; patient is here for review of a ulcer on the tip of the left great toe ?03/26/2021; right plantar foot wound ?08/22/2021: right plantar foot wound ?Electronic Signature(s) ?Signed: 08/22/2021 9:43:43 AM By: Fredirick Maudlin MD FACS ?Entered By: Fredirick Maudlin on 08/22/2021 09:43:43 ?-------------------------------------------------------------------------------- ?HPI Details ?Patient Name: Date of Service: ?[name], [name] 08/22/2021 8:00 A M ?Medical Record Number: [id number] ?Patient Account Number: [id number] ?Date of Birth/Sex: Treating RN: ?[birth year] (61 y.o. M) ?Primary Care Provider: [name] Other Clinician: ?Referring Provider: ?Treating Provider/Extender: Fredirick Maudlin ?[name] ?Weeks in Treatment: 0 ?History of Present Illness ?HPI Description: Admission 03/26/2021 ?[name] is a 61 year old male with a past medical history of uncontrolled type 2 diabetes on insulin with latest hemoglobin A1c of 15, previous left ?foot wound with osteomyelitis, peripheral arterial disease, status post kidney transplant, that presents to the clinic for a 1.5-year history of nonhealing wound to ?the plantar aspect of his right foot. He has been following with Dr. Earleen Newport, podiatry for this issue. He had surgical debridement with bone biopsy on ?03/06/2021. Biopsy did not show evidence for osteomyelitis. At that time ACell was placed. He also follows with Dr. Megan Salon for antibiotic therapy and  is ?currently on linezolid and Augmentin. He currently denies systemic signs of infection. He has been using Vaseline to the wound bed. He currently uses a front ?offloading shoe. He is following Dr. Loanne Drilling for his diabetes. ?11/29; patient has not been here in 4 weeks. 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 He was given silver alginate and a surgical shoe ?and the wound is actually come in by 1 cm in width which is quite a big improvement. He has been evaluated by Dr. Gwenlyn Found because of a history of PAD. Last ?seen on 04/03/2021. Repeat noninvasive studies were done on 04/12/2021 showing a noncompressible ABI on the right but a TBI of 0.95 but monophasic ?waveforms. On the left also noncompressible with monophasic waveforms but with a TBI of 0.68. The normal TBI's bilaterally suggest adequate blood flow ?12/13; right plantar first metatarsal head diabetic ulcer. He has been treated for osteomyelitis by infectious disease. He has been evaluated by Dr. Gwenlyn Found. We ?have been using silver alginate under a total contact cast for the last week ?12/6; the wound was dramatically better last week. Slightly smaller this week but unfortunately the tissue was nonadherent. He has been using silver alginate ?with a surgical shoe ?12/9; patient presents for first cast change. Has no issues or complaints today. ?12/20; patient's wound is measuring smaller on the right first metatarsal head. Looks quite good and has no depth. We are using Hydrofera Blue under a total ?contact cast ?The patient comes in with very high BP. States he ran out of his clonidine patch 2 days ago because the pharmacy did not have it ?12/28; improved measurements on the wound on the plantar right first metatarsal head. The wound looks quite good has no depth. We are using Hydrofera Blue ?under a total contact cast ?2023 ?05/28/2021; patient is here in follow-up with [facility] and total contact  cast on his right first metatarsal  head wound. He continues to make excellent ?progress. Wound is smaller looks healthy. ?1/10; patient is not quite closed. Thick skin and callus around the small wound margin I remove this with a #3 curette we have been using Hydrofera Blue under ?a total contact cast which we will continue ?1/17; comes in today with still some callus over the wound surface which I removed on the right first metatarsal head he has no open wound. He has his ?insoles for his shoes but did not bring a callus protector. We closed him with a total contact cast and Hydrofera Blue. He had a bone biopsy which showed ?osteomyelitis which was done by podiatry before he came to clinic. He was treated with antibiotics as directed by Megan Salon of infectious disease. Everything ?is closed. ?READMISSION ?08/22/2021 ?[name] returns to clinic today with significant deterioration of the right plantar foot wound. He presented to the hospital just over a week ago with ?complaints of right foot pain. The ulcer had remained opened. MRI showed: ?IMPRESSION: ?Extensive soft tissue swelling of the foot with small pockets of ?fluid within the deep intrinsic muscular compartment. These could ?represent localized fluid/edema or potentially developing abscesses ?in the setting of a soft tissue infection. There is visible plantar ?soft tissue ulcer along the medial forefoot, correlate with ?drainage. ?No definitive evidence of osteomyelitis. Periarticular marrow ?changes at the metatarsophalangeal joints, severe at the first MTP ?attributable to arthritis. ?Similar bony edema within the proximal fifth metatarsal consistent ?with stress reaction. No visible fracture or adjacent soft tissue ?ulcer. ?Podiatry was consulted and ultimately took him to the operating room for extensive debridement and incision and drainage of multiple abscesses. He was ?actually supposed to follow-up with them and has an appointment in their office at 1:15 PM today. It is not  entirely clear how he ended up coming to our clinic ?today. He does have the wound VAC that was prescribed and it will be applied at his visit with podiatry. ?He saw Dr. Gwenlyn Found yesterday and he is scheduled to have an arteriogram with potential intervention with Dr. Fletcher Anon next week on April 5. ?Today on exam, his right plantar foot is widely open with exposed tendon and the wound is undermined substantially towards the lateral aspect of the foot. The ?wound probes to bone. In addition, he has wounds between his third and fourth and fourth and fifth toes that look like they potentially were previously draining ?pus, but now the drainage is serosanguineous. The overlying skin is incredibly macerated and looks like it would easily just slip off the surface without much ?effort. The right fourth toe is deeply discolored compared to the other toes and appears likely to be ischemic. ?Electronic Signature(s) ?Signed: 08/22/2021 10:17:53 AM By: Fredirick Maudlin MD FACS ?Entered By: Fredirick Maudlin on 08/22/2021 10:17:53 ?-------------------------------------------------------------------------------- ?Physical Exam Details ?Patient Name: Date of Service: ?[name], [name] 08/22/2021 8:00 A M ?Medical Record Number: [id number] ?Patient Account Number: [id number] ?Date of Birth/Sex: Treating RN: ?12/04/1960 (61 y.o. M) ?Primary Care Provider: [name] Other Clinician: ?Referring Provider: ?Treating Provider/Extender: Fredirick Maudlin ?[name] ?Weeks in Treatment: 0 ?Constitutional ?. . . . No acute distress. ?Respiratory ?Normal work of breathing on room air.[name] ?Notes ?08/22/2021: Wound examon the plantar surface of his right foot, there is a very large surgical wound with exposed tendon and muscle. The wound undermines ?substantially toward the lateral aspect of his foot. Bone is probed at the base. No significant drainage or  odor. His right fourth toe is discolored and the ?overlying skin is extremely macerated and  looks like it could easily just slip off and denude the area. Between the toes, there are open areas that appear to ?represent to the incision and drainage sites. ?Electronic Signature(s) ?Signed: 08/22/2021 10:2

## 2021-08-22 NOTE — Telephone Encounter (Signed)
-----   Message from Lorretta Harp, MD sent at 08/22/2021 12:24 PM EDT ----- ?Needs repeat BMET today or tomorrow ?

## 2021-08-22 NOTE — Progress Notes (Signed)
KASSIDY, FRANKSON ([id number]) ?Visit Report for 08/22/2021 ?Allergy List Details ?Patient Name: Date of Service: ?[name], [name] 08/22/2021 8:00 A M ?Medical Record Number: [id number] ?Patient Account Number: [id number] ?Date of Birth/Sex: Treating RN: ?19-Jan-1961 (61 y.o. Jerilynn Mages) Dellie Catholic ?Primary Care Jameya Pontiff: [name] Other Clinician: ?Referring Rickelle Sylvestre: ?Treating Izabellah Dadisman/Extender: Fredirick Maudlin ?[name] ?Weeks in Treatment: 0 ?Allergies ?Active Allergies ?No Known Allergies ?Allergy Notes ?Electronic Signature(s) ?Signed: 08/22/2021 10:45:00 AM By: Dellie Catholic RN ?Entered By: Dellie Catholic on 08/22/2021 08:33:10 ?-------------------------------------------------------------------------------- ?Arrival Information Details ?Patient Name: Date of Service: ?[name], [name] 08/22/2021 8:00 A M ?Medical Record Number: [id number] ?Patient Account Number: [id number] ?Date of Birth/Sex: Treating RN: ?1961/02/26 (61 y.o. M) ?Primary Care Aneyah Lortz: [name] Other Clinician: ?Referring Korver Graybeal: ?Treating Chrystle Murillo/Extender: Fredirick Maudlin ?[name] ?Weeks in Treatment: 0 ?Visit Information ?Patient Arrived: Ambulatory ?Arrival Time: 08:14 ?Accompanied By: wife ?Transfer Assistance: None ?Patient Identification Verified: Yes ?Secondary Verification Process Completed: Yes ?History Since Last Visit ?Added or deleted any medications: No ?Any new allergies or adverse reactions: No ?Had a fall or experienced change in activities of daily living that may affect risk of falls: No ?Signs or symptoms of abuse/neglect since last visito No ?Hospitalized since last visit: Yes ?Implantable device outside of the clinic excluding cellular tissue based products placed in the center since last visit: No ?Has Dressing in Place as Prescribed: Yes ?Pain Present Now: No ?Electronic Signature(s) ?Signed: 08/22/2021 9:20:58 AM By: Sandre Kitty ?Entered By: Sandre Kitty on 08/22/2021  08:15:46 ?-------------------------------------------------------------------------------- ?Clinic Level of Care Assessment Details ?Patient Name: Date of Service: ?[name], [name] 08/22/2021 8:00 A M ?Medical Record Number: [id number] ?Patient Account Number: [id number] ?Date of Birth/Sex: Treating RN: ?January 29, 1961 (61 y.o. Jerilynn Mages) Dellie Catholic ?Primary Care Lashan Gluth: [name] Other Clinician: ?Referring Tanairy Payeur: ?Treating Psalm Arman/Extender: Fredirick Maudlin ?[name] ?Weeks in Treatment: 0 ?Clinic Level of Care Assessment Items ?TOOL 2 Quantity Score ?X- 1 0 ?Use when only an EandM is performed on the INITIAL visit ?ASSESSMENTS - Nursing Assessment / Reassessment ?X- 1 20 ?General Physical Exam (combine w/ comprehensive assessment (listed just below) when performed on new pt. evals) ?X- 1 25 ?Comprehensive Assessment (HX, ROS, Risk Assessments, Wounds Hx, etc.) ?ASSESSMENTS - Wound and Skin A ssessment / Reassessment ?'[]'$  - 0 ?Simple Wound Assessment / Reassessment - one wound ?X- 2 5 ?Complex Wound Assessment / Reassessment - multiple wounds ?X- 1 10 ?Dermatologic / Skin Assessment (not related to wound area) ?ASSESSMENTS - Ostomy and/or Continence Assessment and Care ?'[]'$  - 0 ?Incontinence Assessment and Management ?'[]'$  - 0 ?Ostomy Care Assessment and Management (repouching, etc.) ?PROCESS - Coordination of Care ?'[]'$  - 0 ?Simple Patient / Family Education for ongoing care ?X- 1 20 ?Complex (extensive) Patient / Family Education for ongoing care ?X- 1 10 ?Staff obtains Consents, Records, T Results / Process Orders ?est ?X- 1 10 ?Staff telephones HHA, Nursing Homes / Clarify orders / etc ?'[]'$  - 0 ?Routine Transfer to another Facility (non-emergent condition) ?'[]'$  - 0 ?Routine Hospital Admission (non-emergent condition) ?'[]'$  - 0 ?New Admissions / Biomedical engineer / Ordering NPWT Apligraf, etc. ?, ?X- 1 20 ?Emergency Hospital Admission (emergent condition) ?'[]'$  - 0 ?Simple Discharge Coordination ?X- 1  15 ?Complex (extensive) Discharge Coordination ?PROCESS - Special Needs ?'[]'$  - 0 ?Pediatric / Minor Patient Management ?'[]'$  - 0 ?Isolation Patient Management ?'[]'$  - 0 ?Hearing / Language / Visual special needs ?'[]'$  - 0 ?Assessment of Community assistance (transportation, D/C planning, etc.) ?'[]'$  - 0 ?Additional assistance /  Altered mentation ?'[]'$  - 0 ?Support Surface(s) Assessment (bed, cushion, seat, etc.) ?INTERVENTIONS - Wound Cleansing / Measurement ?X- 1 5 ?Wound Imaging (photographs - any number of wounds) ?'[]'$  - 0 ?Wound Tracing (instead of photographs) ?'[]'$  - 0 ?Simple Wound Measurement - one wound ?X- 2 5 ?Complex Wound Measurement - multiple wounds ?'[]'$  - 0 ?Simple Wound Cleansing - one wound ?X- 2 5 ?Complex Wound Cleansing - multiple wounds ?INTERVENTIONS - Wound Dressings ?'[]'$  - 0 ?Small Wound Dressing one or multiple wounds ?X- 2 15 ?Medium Wound Dressing one or multiple wounds ?'[]'$  - 0 ?Large Wound Dressing one or multiple wounds ?'[]'$  - 0 ?Application of Medications - injection ?INTERVENTIONS - Miscellaneous ?'[]'$  - 0 ?External ear exam ?'[]'$  - 0 ?Specimen Collection (cultures, biopsies, blood, body fluids, etc.) ?'[]'$  - 0 ?Specimen(s) / Culture(s) sent or taken to Lab for analysis ?'[]'$  - 0 ?Patient Transfer (multiple staff / Civil Service fast streamer / Similar devices) ?'[]'$  - 0 ?Simple Staple / Suture removal (25 or less) ?'[]'$  - 0 ?Complex Staple / Suture removal (26 or more) ?'[]'$  - 0 ?Hypo / Hyperglycemic Management (close monitor of Blood Glucose) ?'[]'$  - 0 ?Ankle / Brachial Index (ABI) - do not check if billed separately ?Has the patient been seen at the hospital within the last three years: Yes ?Total Score: 195 ?Level Of Care: New/Established - Level 5 ?Electronic Signature(s) ?Signed: 08/22/2021 10:45:00 AM By: Dellie Catholic RN ?Entered By: Dellie Catholic on 08/22/2021 09:24:26 ?-------------------------------------------------------------------------------- ?Encounter Discharge Information Details ?Patient Name: Date of  Service: ?[name], [name] 08/22/2021 8:00 A M ?Medical Record Number: [id number] ?Patient Account Number: [id number] ?Date of Birth/Sex: Treating RN: ?[birth year] (61 y.o. Jerilynn Mages) Dellie Catholic ?Primary Care Thaddus Mcdowell: [name] Other Clinician: ?Referring Kelsay Haggard: ?Treating Tyshawn Keel/Extender: Fredirick Maudlin ?[name] ?Weeks in Treatment: 0 ?Encounter Discharge Information Items ?Discharge Condition: Stable ?Ambulatory Status: Ambulatory ?Discharge Destination: Home ?Transportation: Private Auto ?Accompanied By: Spouse ?Schedule Follow-up Appointment: Yes ?Clinical Summary of Care: Patient Declined ?Electronic Signature(s) ?Signed: 08/22/2021 10:45:00 AM By: Dellie Catholic RN ?Entered By: Dellie Catholic on 08/22/2021 10:42:38 ?-------------------------------------------------------------------------------- ?Lower Extremity Assessment Details ?Patient Name: Date of Service: ?[name], [name] 08/22/2021 8:00 A M ?Medical Record Number: [id number] ?Patient Account Number: [id number] ?Date of Birth/Sex: ?Treating RN: ?[birth year] (61 y.o. Jerilynn Mages) Dellie Catholic ?Primary Care [name] Mankey: [name] ?Other Clinician: ?Referring Kaisha Wachob: ?Treating Ludwika Rodd/Extender: Fredirick Maudlin ?[name] ?Weeks in Treatment: 0 ?Edema Assessment ?Assessed: [Left: No] [Right: No] ?E[Left: dema] [Right: :] ?Calf ?Left: Right: ?Point of Measurement: 32 cm From Medial Instep 39.5 cm ?Ankle ?Left: Right: ?Point of Measurement: 11 cm From Medial Instep 26 cm ?Knee To Floor ?Left: Right: ?From Medial Instep 47 cm ?Vascular Assessment ?Pulses: ?Dorsalis Pedis ?Palpable: [Right:Yes] ?Electronic Signature(s) ?Signed: 08/22/2021 10:45:00 AM By: Dellie Catholic RN ?Entered By: Dellie Catholic on 08/22/2021 08:48:46 ?-------------------------------------------------------------------------------- ?Multi Wound Chart Details ?Patient Name: ?Date of Service: ?[name], [name] 08/22/2021 8:00 A M ?Medical Record Number: [id number] ?Patient  Account Number: [id number] ?Date of Birth/Sex: ?Treating RN: ?24-Mar-1961 (61 y.o. M) ?Primary Care Bentleigh Stankus: [name] ?Other Clinician: ?Referring Rudra Hobbins: ?Treating Patty Lopezgarcia/Extender: Fredirick Maudlin ?[name] ?Weeks in Treatment:

## 2021-08-22 NOTE — Addendum Note (Signed)
Addended by: Waylan Rocher on: 08/22/2021 05:36 PM ? ? Modules accepted: Orders ? ?

## 2021-08-22 NOTE — Telephone Encounter (Signed)
This encounter was created in error - please disregard.

## 2021-08-23 ENCOUNTER — Other Ambulatory Visit: Payer: Self-pay

## 2021-08-23 ENCOUNTER — Telehealth: Payer: Self-pay

## 2021-08-23 DIAGNOSIS — E782 Mixed hyperlipidemia: Secondary | ICD-10-CM

## 2021-08-23 NOTE — Telephone Encounter (Signed)
Pt was here for lab already ?

## 2021-08-23 NOTE — Telephone Encounter (Signed)
Spoke with pt regarding labs drawn today. Repeat labs still show elevated potassium. With pt's history of kidney transplant Dr. Gwenlyn Found would like be advised per pt's nephrologist on steps moving forward. ? ?Dr. Gwenlyn Found spoke with Dr. Stanton Kidney (nephrology) who states that labs drawn today at his office showed similar creatinine levels, but potassium was within normal limits. Per Dr. Stanton Kidney pt can move forward with PV angiogram to be done by Dr. Fletcher Anon on 4/5. Per Dr. Stanton Kidney pt does need to be pre-hydrated and will need to have a leg femoral artery stick (since right kidney was transplanted).  ? ?Spoke with pt and made him aware of these recommendations. Instructed pt to come to the hospital on the day of his procedure 2 hours earlier for pre-hydration. I will place necessary orders and make cath lab team aware. Pt verbalizes understanding. ?

## 2021-08-23 NOTE — Telephone Encounter (Signed)
-----   Message from Lorretta Harp, MD sent at 08/23/2021 11:03 AM EDT ----- ?Serum creatinine still increasing and potassium elevated.  Was given medication to reduce potassium he probably needs to see his nephrologist for further evaluation. ?

## 2021-08-24 ENCOUNTER — Other Ambulatory Visit: Payer: Self-pay

## 2021-08-24 ENCOUNTER — Encounter (HOSPITAL_COMMUNITY): Payer: Self-pay | Admitting: Emergency Medicine

## 2021-08-24 ENCOUNTER — Telehealth: Payer: Self-pay | Admitting: Internal Medicine

## 2021-08-24 ENCOUNTER — Emergency Department (HOSPITAL_COMMUNITY)
Admission: EM | Admit: 2021-08-24 | Discharge: 2021-08-24 | Disposition: A | Discharge status: Home / Self Care | Payer: Self-pay | Attending: Emergency Medicine | Admitting: Emergency Medicine

## 2021-08-24 DIAGNOSIS — E875 Hyperkalemia: Secondary | ICD-10-CM | POA: Insufficient documentation

## 2021-08-24 DIAGNOSIS — Z940 Kidney transplant status: Secondary | ICD-10-CM

## 2021-08-24 DIAGNOSIS — I120 Hypertensive chronic kidney disease with stage 5 chronic kidney disease or end stage renal disease: Secondary | ICD-10-CM | POA: Insufficient documentation

## 2021-08-24 DIAGNOSIS — N186 End stage renal disease: Secondary | ICD-10-CM | POA: Insufficient documentation

## 2021-08-24 DIAGNOSIS — Z79899 Other long term (current) drug therapy: Secondary | ICD-10-CM | POA: Insufficient documentation

## 2021-08-24 LAB — CBC WITH DIFFERENTIAL/PLATELET
Abs Immature Granulocytes: 0.07 10*3/uL (ref 0.00–0.07)
Basophils Absolute: 0 10*3/uL (ref 0.0–0.1)
Basophils Relative: 1 %
Eosinophils Absolute: 0.2 10*3/uL (ref 0.0–0.5)
Eosinophils Relative: 3 %
HCT: 34.4 % — ABNORMAL LOW (ref 39.0–52.0)
Hemoglobin: 9.9 g/dL — ABNORMAL LOW (ref 13.0–17.0)
Immature Granulocytes: 1 %
Lymphocytes Relative: 27 %
Lymphs Abs: 1.3 10*3/uL (ref 0.7–4.0)
MCH: 24.6 pg — ABNORMAL LOW (ref 26.0–34.0)
MCHC: 28.8 g/dL — ABNORMAL LOW (ref 30.0–36.0)
MCV: 85.6 fL (ref 80.0–100.0)
Monocytes Absolute: 0.4 10*3/uL (ref 0.1–1.0)
Monocytes Relative: 9 %
Neutro Abs: 2.9 10*3/uL (ref 1.7–7.7)
Neutrophils Relative %: 59 %
Platelets: 295 10*3/uL (ref 150–400)
RBC: 4.02 MIL/uL — ABNORMAL LOW (ref 4.22–5.81)
RDW: 14.7 % (ref 11.5–15.5)
WBC: 4.9 10*3/uL (ref 4.0–10.5)
nRBC: 0 % (ref 0.0–0.2)

## 2021-08-24 LAB — LIPID PANEL
Chol/HDL Ratio: 2.7 ratio (ref 0.0–5.0)
Cholesterol, Total: 104 mg/dL (ref 100–199)
HDL: 38 mg/dL — ABNORMAL LOW (ref >39)
LDL Chol Calc (NIH): 46 mg/dL (ref 0–99)
Triglycerides: 111 mg/dL (ref 0–149)
VLDL Cholesterol Cal: 20 mg/dL (ref 5–40)

## 2021-08-24 LAB — BASIC METABOLIC PANEL
Anion gap: 5 (ref 5–15)
BUN/Creatinine Ratio: 14 (ref 10–24)
BUN: 24 mg/dL (ref 8–27)
BUN: 28 mg/dL — ABNORMAL HIGH (ref 6–20)
CO2: 21 mmol/L (ref 20–29)
CO2: 25 mmol/L (ref 22–32)
Calcium: 9.5 mg/dL (ref 8.6–10.2)
Calcium: 9.4 mg/dL (ref 8.9–10.3)
Chloride: 105 mmol/L (ref 96–106)
Chloride: 106 mmol/L (ref 98–111)
Creatinine, Ser: 1.72 mg/dL — ABNORMAL HIGH (ref 0.76–1.27)
Creatinine, Ser: 1.79 mg/dL — ABNORMAL HIGH (ref 0.61–1.24)
GFR, Estimated: 43 mL/min — ABNORMAL LOW (ref >60)
Glucose, Bld: 371 mg/dL — ABNORMAL HIGH (ref 70–99)
Glucose: 206 mg/dL — ABNORMAL HIGH (ref 70–99)
Potassium: 6.1 mmol/L (ref 3.5–5.2)
Potassium: 5.8 mmol/L — ABNORMAL HIGH (ref 3.5–5.1)
Sodium: 138 mmol/L (ref 134–144)
Sodium: 136 mmol/L (ref 135–145)
eGFR: 45 mL/min/{1.73_m2} — ABNORMAL LOW (ref >59)

## 2021-08-24 MED ORDER — CIPROFLOXACIN HCL 250 MG PO TABS
500.0000 mg | ORAL_TABLET | Freq: Once | ORAL | Status: AC
Start: 2021-08-24 — End: 2021-08-24
  Administered 2021-08-24: 500 mg via ORAL
  Filled 2021-08-24: qty 2

## 2021-08-24 MED ORDER — CIPROFLOXACIN HCL 500 MG PO TABS: 500.0000 mg | ORAL_TABLET | Freq: Two times a day (BID) | ORAL | 0 refills | Status: DC

## 2021-08-24 MED ORDER — SODIUM ZIRCONIUM CYCLOSILICATE 5 G PO PACK
10.0000 g | PACK | Freq: Once | ORAL | Status: AC
  Administered 2021-08-24: 10 g via ORAL
  Filled 2021-08-24: qty 2

## 2021-08-24 NOTE — Telephone Encounter (Signed)
Called by [facility] of [place] of 6[facility]1.  Patient referred to ED. ?

## 2021-08-24 NOTE — ED Triage Notes (Signed)
Pt received a call from MD @ [facility] at 0300 this am and was told that he needed to come to ED because his Potassium was 6. ?

## 2021-08-24 NOTE — Discharge Instructions (Signed)
Begin taking Cipro as prescribed. ? ?Stop taking Bactrim (sulfamethoxazole/trimethoprim). ? ?Follow-up with your nephrologist next week for a repeat metabolic panel. ? ?Return to the ER if you experience any new and/or concerning symptoms. ?

## 2021-08-24 NOTE — ED Notes (Signed)
ED Provider at bedside. 

## 2021-08-24 NOTE — ED Provider Notes (Signed)
?[facility] ?Provider Note ? ? ?CSN: [id number] ?Arrival date & time: 08/24/21  4656 ? ?  ? ?History ? ?Chief Complaint  ?Patient presents with  ? Abnormal Labs  ? ? ?Raymond [name] is a 61 y.o. male. ? ?Patient is a 61 year old male with history of renal transplant, hyperlipidemia, hypertension, and peripheral vascular disease.  Patient presenting at the request of his primary doctor for evaluation of an elevated potassium level.  He was told his potassium was 6 on a blood draw yesterday, and that he needed to go to the nearest ER to be evaluated.  Patient has no symptoms.  He reports normal urination. ? ?The history is provided by the patient.  ? ?  ? ?Home Medications ?Prior to Admission medications   ?Medication Sig Start Date End Date Taking? Authorizing Provider  ?amoxicillin-clavulanate (AUGMENTIN) 875-125 MG tablet Take 1 tablet by mouth 2 (two) times daily for 14 days. 08/18/21 09/01/21  Landis Martins, DPM  ?atorvastatin (LIPITOR) 10 MG tablet Take 10 mg by mouth daily.    [provider]  ?blood glucose meter kit and supplies KIT Dispense based on patient and insurance preference. Use up to four times daily as directed. (FOR ICD-9 250.00, 250.01). 04/11/20   [name], MD  ?Blood Glucose Monitoring Suppl (ACCU-CHEK GUIDE ME) w/Device KIT 1 Piece by Does not apply route as directed. 02/18/18   [name], MD  ?brimonidine (ALPHAGAN) 0.2 % ophthalmic solution Place 1 drop into both eyes 3 (three) times daily. 07/25/21   [provider]  ?cloNIDine (CATAPRES - DOSED IN MG/24 HR) 0.3 mg/24hr patch Place 1 patch (0.3 mg total) onto the skin once a week. 08/21/21   Lorretta Harp, MD  ?dorzolamide-timolol (COSOPT) 22.3-6.8 MG/ML ophthalmic solution Place 1 drop into both eyes 2 (two) times daily. 07/25/21   [provider]  ?doxazosin (CARDURA) 4 MG tablet Take 4 mg by mouth daily. 12/18/15   [provider]  ?Dulaglutide (TRULICITY) 8.12  XN/1.7GY SOPN Inject 0.75 mg into the skin once a week. ?Patient taking differently: Inject 0.75 mg into the skin every Sunday. 03/25/21   Renato Shin, MD  ?glucose blood (ACCU-CHEK GUIDE) test strip Use as instructed 4 x daily. E11.65 02/19/18   [name], MD  ?hydrALAZINE (APRESOLINE) 25 MG tablet Take 25 mg by mouth 2 (two) times daily.    [provider]  ?HYDROcodone-acetaminophen (NORCO/VICODIN) 5-325 MG tablet Take 1 tablet by mouth every 4 (four) hours as needed for moderate pain. 08/22/21 08/22/22  Landis Martins, DPM  ?Insulin NPH, Human,, Isophane, (NOVOLIN N FLEXPEN) 100 UNIT/ML Kiwkpen Inject 50 Units into the skin every morning. ?Patient taking differently: Inject 60 Units into the skin in the morning and at bedtime. 06/27/21   Terrilee Croak, MD  ?latanoprost (XALATAN) 0.005 % ophthalmic solution Place 1 drop into both eyes at bedtime. 07/25/21   [provider]  ?Multiple Vitamin (MULTIVITAMIN) capsule Take 1 capsule by mouth daily.    [provider]  ?sodium bicarbonate 650 MG tablet Take 1,300 mg by mouth 3 (three) times daily. 11/02/18   [provider]  ?sulfamethoxazole-trimethoprim (BACTRIM DS) 800-160 MG tablet Take 1 tablet by mouth 2 (two) times daily for 14 days. 08/19/21 09/02/21  Dessa Phi, DO  ?tacrolimus (PROGRAF) 1 MG capsule Take 3-4 mg by mouth See admin instructions. Take 3 capsules (24m) in AM & take 4 capsules (426m in PM    [provider]  ?Vitamin D,  Ergocalciferol, (DRISDOL) 50000 units CAPS capsule Take 50,000 Units by mouth every 30 (thirty) days.    [provider]  ?   ? ?Allergies    ?Patient has no known allergies.   ? ?Review of Systems   ?Review of Systems  ?All other systems reviewed and are negative. ? ?Physical Exam ?Updated Vital Signs ?Ht '5\' 7"'  (1.702 m)   Wt 98.4 kg   BMI 33.99 kg/m?  ?Physical Exam ?Vitals and nursing note reviewed.  ?Constitutional:   ?   General: He is not in acute distress. ?    Appearance: He is well-developed. He is not diaphoretic.  ?HENT:  ?   Head: Normocephalic and atraumatic.  ?Cardiovascular:  ?   Rate and Rhythm: Normal rate and regular rhythm.  ?   Heart sounds: No murmur heard. ?  No friction rub.  ?Pulmonary:  ?   Effort: Pulmonary effort is normal. No respiratory distress.  ?   Breath sounds: Normal breath sounds. No wheezing or rales.  ?Abdominal:  ?   General: Bowel sounds are normal. There is no distension.  ?   Palpations: Abdomen is soft.  ?   Tenderness: There is no abdominal tenderness.  ?Musculoskeletal:     ?   General: Normal range of motion.  ?   Cervical back: Normal range of motion and neck supple.  ?   Comments: There is wrap to the right foot with a wound VAC in place.  ?Skin: ?   General: Skin is warm and dry.  ?Neurological:  ?   Mental Status: He is alert and oriented to person, place, and time.  ?   Coordination: Coordination normal.  ? ? ?ED Results / Procedures / Treatments   ?Labs ?(all labs ordered are listed, but only abnormal results are displayed) ?Labs Reviewed - No data to display ? ?EKG ?None ? ?Radiology ?DG Foot Complete Right ? ?Result Date: 08/22/2021 ?Please see detailed radiograph report in office note.  ? ?Procedures ?Procedures  ? ? ?Medications Ordered in ED ?Medications - No data to display ? ?ED Course/ Medical Decision Making/ A&P ? ?This patient presents to the ED for concern of elevated potassium, this involves an extensive number of treatment options, and is a complaint that carries with it a high risk of complications and morbidity.  The differential diagnosis includes worsening renal failure ? ? ?Co morbidities that complicate the patient evaluation ? ?End-stage renal disease with renal transplant ? ? ?Additional history obtained: ? ?External records from his nephrologist office reviewed ? ? ?Lab Tests: ? ?I Ordered, and personally interpreted labs.  The pertinent results include: Unremarkable CBC, but basic metabolic panel does show  potassium of 5.8 ? ? ?Imaging Studies ordered: ? ?No imaging studies obtained ? ? ?Cardiac Monitoring: / EKG: ? ?None performed ? ? ?Consultations Obtained: ? ?I requested consultation with the nephrologist, Dr. Joelyn Oms,  and discussed lab and imaging findings as well as pertinent plan - they recommend: Stopping Bactrim, a one-time dose of Lokelma, and follow-up in the office ? ? ?Problem List / ED Course / Critical interventions / Medication management ? ?Patient presenting here at the request of his nephrologist for elevated potassium.  He has history of renal transplant.  Patient is otherwise asymptomatic and has no complaints.  Laboratory studies obtained show a potassium level of 5.8.  This was discussed with Dr. Joelyn Oms from nephrology.  Dr. Joelyn Oms believes as though because of his potassium elevation is the Bactrim he was recently  started on.  He is recommending stopping that medication and changing to a different antibiotic.  I have reviewed the patient's cultures and it appears as though the staph organism which has grown is susceptible to Cipro and will start this medication.  He will also be given a dose of Lokelma here in the ED prior to discharge. ?I have reviewed the patients home medicines and have made adjustments as needed ? ? ?Social Determinants of Health: ? ?None ? ? ?Test / Admission - Considered: ? ?Patient seems appropriate for discharge.  His vitals are stable and laboratory studies otherwise unremarkable. ? ? ?Final Clinical Impression(s) / ED Diagnoses ?Final diagnoses:  ?None  ? ? ?Rx / DC Orders ?ED Discharge Orders   ? ? None  ? ?  ? ? ?  ?Veryl Speak, MD ?08/24/21 [phone] ? ?

## 2021-08-26 ENCOUNTER — Ambulatory Visit (INDEPENDENT_AMBULATORY_CARE_PROVIDER_SITE_OTHER): Payer: Self-pay | Admitting: Podiatrist

## 2021-08-26 ENCOUNTER — Encounter: Payer: Self-pay | Admitting: Podiatrist

## 2021-08-26 DIAGNOSIS — I998 Other disorder of circulatory system: Secondary | ICD-10-CM

## 2021-08-26 DIAGNOSIS — L97512 Non-pressure chronic ulcer of other part of right foot with fat layer exposed: Secondary | ICD-10-CM

## 2021-08-26 NOTE — Progress Notes (Signed)
?  ? ?HPI: Patient is 61 y.o. male who presented to the office today to have his wound vac changed due to it becoming wet around the toes.  Raymond [name] asked me to see this patient due to the right fourth toe looking darker than normal.  The patients wife states the toe became darker in color over the last couple of days.  The patient also relates he is scheduled for surgery to improve blood flow to his right foot by Dr. Gwenlyn [name] on April 5th in 2 days.  He denies any nausea, vomiting,fevers or chills.  Denies any malodor or pus from the foot or the toe.  Also relates the toes are wet with the use of the wound vac which they are here to have changed today.  ? ?Patient Active Problem List  ? Diagnosis Date Noted  ? Diabetic foot ulcer ([place]) 08/14/2021  ? CKD (chronic kidney disease) stage 3, GFR 30-59 ml/min (HCC) 08/14/2021  ? Hypomagnesemia 06/25/2021  ? Septic arthritis of elbow, left ([place]) 06/24/2021  ? Peripheral vascular disease ([place]) 04/24/2020  ? Pyogenic inflammation of bone (HCC)   ? Thrombophlebitis of superficial veins of right lower extremity   ? Difficult intravenous access   ? MRSA bacteremia   ? Diabetic foot infection ([place])   ? Infected wound 01/12/2020  ? Critical lower limb ischemia ([place]) 02/02/2019  ? Personal history of noncompliance with medical treatment, presenting hazards to health 10/07/2018  ? Critical limb ischemia with history of revascularization of same extremity ([place]) 07/27/2018  ? Mixed hyperlipidemia 02/19/2018  ? History of renal transplant 02/19/2018  ? Conductive hearing loss, bilateral 08/31/2017  ? Nuclear sclerotic cataract of right eye 10/07/2016  ? Pseudophakia of left eye 10/07/2016  ? Hypertension due to endocrine disorder 03/04/2016  ? 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 Renal failure 03/04/2016  ? Proliferative diabetic retinopathy with macular edema associated with type 2 diabetes  mellitus ([place]) 03/04/2016  ? Immunosuppression ([place]) 12/10/2015  ? Nausea vomiting and diarrhea 12/10/2015  ? Type 2 diabetes mellitus ([place]) 12/10/2015  ? Essential hypertension, benign 12/10/2015  ? Pancytopenia ([place]) 12/10/2015  ? Encounter for screening colonoscopy 11/15/2014  ? GERD (gastroesophageal reflux disease) 11/15/2014  ? ? ?Current Outpatient Medications on File Prior to Visit  ?Medication Sig Dispense Refill  ? amoxicillin-clavulanate (AUGMENTIN) 875-125 MG tablet Take 1 tablet by mouth 2 (two) times daily for 14 days. 28 tablet 0  ? atorvastatin (LIPITOR) 10 MG tablet Take 10 mg by mouth daily.    ? blood glucose meter kit and supplies KIT Dispense based on patient and insurance preference. Use up to four times daily as directed. (FOR ICD-9 250.00, 250.01). 1 each 0  ? Blood Glucose Monitoring Suppl (ACCU-CHEK GUIDE ME) w/Device KIT 1 Piece by Does not apply route as directed. 1 kit 0  ? brimonidine (ALPHAGAN) 0.2 % ophthalmic solution Place 1 drop into both eyes 3 (three) times daily.    ? ciprofloxacin (CIPRO) 500 MG tablet Take 1 tablet (500 mg total) by mouth 2 (two) times daily. One po bid x 7 days 14 tablet 0  ? cloNIDine (CATAPRES - DOSED IN MG/24 HR) 0.3 mg/24hr patch Place 1 patch (0.3 mg total) onto the skin once a week. 4 patch 12  ? dorzolamide-timolol (COSOPT) 22.3-6.8 MG/ML ophthalmic solution Place 1 drop into both eyes 2 (two) times daily.    ? doxazosin (CARDURA) 4 MG tablet  Take 4 mg by mouth daily.    ? Dulaglutide (TRULICITY) 1.00 FH/2.1FX SOPN Inject 0.75 mg into the skin once a week. (Patient taking differently: Inject 0.75 mg into the skin every Sunday.) 6 mL 3  ? glucose blood (ACCU-CHEK GUIDE) test strip Use as instructed 4 x daily. E11.65 150 each 5  ? hydrALAZINE (APRESOLINE) 25 MG tablet Take 25 mg by mouth 2 (two) times daily.    ? HYDROcodone-acetaminophen (NORCO/VICODIN) 5-325 MG tablet Take 1 tablet by mouth every 4 (four) hours as needed for moderate pain. 20 tablet 0   ? Insulin NPH, Human,, Isophane, (NOVOLIN N FLEXPEN) 100 UNIT/ML Kiwkpen Inject 50 Units into the skin every morning. (Patient taking differently: Inject 60 Units into the skin in the morning and at bedtime.) 105 mL 3  ? latanoprost (XALATAN) 0.005 % ophthalmic solution Place 1 drop into both eyes at bedtime.    ? Multiple Vitamin (MULTIVITAMIN) capsule Take 1 capsule by mouth daily.    ? sodium bicarbonate 650 MG tablet Take 1,300 mg by mouth 3 (three) times daily.    ? tacrolimus (PROGRAF) 1 MG capsule Take 3-4 mg by mouth See admin instructions. Take 3 capsules (57m) in AM & take 4 capsules (443m in PM    ? Vitamin D, Ergocalciferol, (DRISDOL) 50000 units CAPS capsule Take 50,000 Units by mouth every 30 (thirty) days.    ? ?No current facility-administered medications on file prior to visit.  ? ? ?No Known Allergies ? ?Review of Systems ?No fevers, chills, nausea, muscle aches, no difficulty breathing, no calf pain, no chest pain or shortness of breath. ? ? ?Physical Exam ? ?General: No acute distress, AAOx3  ?Right foot: There is a full-thickness granular wound to the plantar aspect of the right foot that measures 8 x 4 cm with exposed tendon at area of I&D/ and debridement site.  There is very minimal bloody drainage, no malodor, no significant periwound erythema or edema however to the dorsum of the foot there is macerated appearing tissue especially at the base of the right third and fourth toes there is significant macerated and friable skin and deroofed peeling skin noted especially at the right fourth toe.  The right fourth toe is dark in color with no capillary refill time noted.  Pedal pulses are unable to be palpated today.   ? ? ? ?Assessment  ? ?  ICD-10-CM   ?1. Foot ulcer, right, with fat layer exposed (HCS[place] L97.512   ?  ?2. Ischemia of toe  I99.8   ?  ?  ? ?Plan ? ?Examined the patients foot and discussed the color of the toe is certainly concerning.  Discussed that it is dark and there is a  possibility the circulation will be restored to the toe and it may come back, but there is also a chance the toe will not heal and an amputation may need to be considered in the future.  He is having his vascular surgery coming up and I discussed that this is the best treatment for this toe to get the blood flowing back to the toe.  It does not look infected.  I recommended keeping the toe as dry as possible and AsCaryl Pinaedressed the foot with the wound vac and placed 4 x 4's around the fourth toe.  I recommended they change these 4x4's throughout the day if they get any moisture around them.  He will be back to see Dr. StCannon Kettleor his scheduled visit.  He will call sooner if any concerns arise.  ? ?

## 2021-08-26 NOTE — Progress Notes (Signed)
Patient presents to the office with complaints of his wound vac leaking.  ? ?Patient was roomed and you could see the bandages were soaked in blood around the toes. The 4th toe right foot was very dark and questioned his wife if this was darker than usual. She expressed that it did look a lot darker. Removed bandages and skin was very white and macerated. States this just started this morning.  ? ?I asked Dr. Valentina Lucks to take a look at the toe and wound prior to redress.  ? ?I redressed foot with the wound vac and replaced the cannister. Seal was successful. DSD around toes and the rest of his foot. Acewrap applied. ? ?Patient has a follow up visit with Dr. Cannon Kettle on Thursday and advised to keep that appointment. ?

## 2021-08-27 ENCOUNTER — Telehealth: Payer: Self-pay | Admitting: *Deleted

## 2021-08-27 ENCOUNTER — Other Ambulatory Visit: Payer: Self-pay

## 2021-08-27 ENCOUNTER — Telehealth: Payer: Self-pay

## 2021-08-27 NOTE — Telephone Encounter (Addendum)
Abdominal aortogram scheduled at [facility] for: Wednesday August 28, 2021 2 PM ?Arrival time and place: [facility] Main Entrance A at: 9:30 -10 AM-pre-procedure hydration ? ? ?No solid food after midnight prior to cath, clear liquids until 5 AM day of procedure. ? ? ?Medication instructions: ?-Hold: ? Insulin-AM of procedure ?-Except hold medications usual morning medications can be taken with sips of water including aspirin 81 mg. ? ?Confirmed patient has responsible adult to drive home post procedure and be with patient first 24 hours after arriving home. ? ?Patient reports no new symptoms concerning for COVID-19/no exposure to COVID-19 in the past 10 days. ? ?Reviewed procedure instructions with patient.  ? ?

## 2021-08-27 NOTE — Telephone Encounter (Signed)
Transition Care Management Unsuccessful Follow-up Telephone Call ? ?Date of discharge and from where:  08/24/2021 ? ?Attempts:  1st Attempt ? ?Reason for unsuccessful TCM follow-up call:  Unable to reach patient ? ?  ?

## 2021-08-27 NOTE — Telephone Encounter (Signed)
Reviewed procedure instructions with patient.  

## 2021-08-27 NOTE — Telephone Encounter (Signed)
Call placed to patient to review procedure instructions, mailbox full.  ?

## 2021-08-28 ENCOUNTER — Inpatient Hospital Stay (HOSPITAL_COMMUNITY)
Admission: RE | Admit: 2021-08-28 | Discharge: 2021-08-30 | DRG: 641 | Disposition: A | Discharge status: Home / Self Care | Payer: Self-pay | Attending: Internal Medicine | Admitting: Internal Medicine

## 2021-08-28 ENCOUNTER — Other Ambulatory Visit: Payer: Self-pay

## 2021-08-28 ENCOUNTER — Encounter (HOSPITAL_COMMUNITY)
Admission: RE | Disposition: A | Discharge status: Home / Self Care | Payer: Self-pay | Source: Home / Self Care | Attending: Internal Medicine

## 2021-08-28 DIAGNOSIS — E119 Type 2 diabetes mellitus without complications: Secondary | ICD-10-CM

## 2021-08-28 DIAGNOSIS — E1142 Type 2 diabetes mellitus with diabetic polyneuropathy: Secondary | ICD-10-CM | POA: Diagnosis present

## 2021-08-28 DIAGNOSIS — I129 Hypertensive chronic kidney disease with stage 1 through stage 4 chronic kidney disease, or unspecified chronic kidney disease: Secondary | ICD-10-CM | POA: Diagnosis present

## 2021-08-28 DIAGNOSIS — L089 Local infection of the skin and subcutaneous tissue, unspecified: Secondary | ICD-10-CM | POA: Diagnosis present

## 2021-08-28 DIAGNOSIS — T148XXA Other injury of unspecified body region, initial encounter: Secondary | ICD-10-CM

## 2021-08-28 DIAGNOSIS — L97509 Non-pressure chronic ulcer of other part of unspecified foot with unspecified severity: Secondary | ICD-10-CM | POA: Diagnosis present

## 2021-08-28 DIAGNOSIS — E669 Obesity, unspecified: Secondary | ICD-10-CM

## 2021-08-28 DIAGNOSIS — Z8249 Family history of ischemic heart disease and other diseases of the circulatory system: Secondary | ICD-10-CM

## 2021-08-28 DIAGNOSIS — B951 Streptococcus, group B, as the cause of diseases classified elsewhere: Secondary | ICD-10-CM | POA: Diagnosis present

## 2021-08-28 DIAGNOSIS — E875 Hyperkalemia: Principal | ICD-10-CM | POA: Diagnosis present

## 2021-08-28 DIAGNOSIS — N189 Chronic kidney disease, unspecified: Secondary | ICD-10-CM

## 2021-08-28 DIAGNOSIS — Z7960 Long term (current) use of unspecified immunomodulators and immunosuppressants: Secondary | ICD-10-CM

## 2021-08-28 DIAGNOSIS — K219 Gastro-esophageal reflux disease without esophagitis: Secondary | ICD-10-CM | POA: Diagnosis present

## 2021-08-28 DIAGNOSIS — Z833 Family history of diabetes mellitus: Secondary | ICD-10-CM

## 2021-08-28 DIAGNOSIS — Z940 Kidney transplant status: Secondary | ICD-10-CM

## 2021-08-28 DIAGNOSIS — E113519 Type 2 diabetes mellitus with proliferative diabetic retinopathy with macular edema, unspecified eye: Secondary | ICD-10-CM | POA: Diagnosis present

## 2021-08-28 DIAGNOSIS — N183 Chronic kidney disease, stage 3 unspecified: Secondary | ICD-10-CM | POA: Diagnosis present

## 2021-08-28 DIAGNOSIS — D631 Anemia in chronic kidney disease: Secondary | ICD-10-CM | POA: Diagnosis present

## 2021-08-28 DIAGNOSIS — Z6833 Body mass index (BMI) 33.0-33.9, adult: Secondary | ICD-10-CM

## 2021-08-28 DIAGNOSIS — E11628 Type 2 diabetes mellitus with other skin complications: Secondary | ICD-10-CM | POA: Diagnosis present

## 2021-08-28 DIAGNOSIS — N1831 Chronic kidney disease, stage 3a: Secondary | ICD-10-CM | POA: Diagnosis present

## 2021-08-28 DIAGNOSIS — Z794 Long term (current) use of insulin: Secondary | ICD-10-CM

## 2021-08-28 DIAGNOSIS — D849 Immunodeficiency, unspecified: Secondary | ICD-10-CM | POA: Diagnosis present

## 2021-08-28 DIAGNOSIS — Z7985 Long-term (current) use of injectable non-insulin antidiabetic drugs: Secondary | ICD-10-CM

## 2021-08-28 DIAGNOSIS — E1122 Type 2 diabetes mellitus with diabetic chronic kidney disease: Secondary | ICD-10-CM

## 2021-08-28 DIAGNOSIS — E1165 Type 2 diabetes mellitus with hyperglycemia: Secondary | ICD-10-CM | POA: Diagnosis present

## 2021-08-28 DIAGNOSIS — R739 Hyperglycemia, unspecified: Secondary | ICD-10-CM

## 2021-08-28 DIAGNOSIS — E11621 Type 2 diabetes mellitus with foot ulcer: Secondary | ICD-10-CM | POA: Diagnosis present

## 2021-08-28 DIAGNOSIS — Z862 Personal history of diseases of the blood and blood-forming organs and certain disorders involving the immune mechanism: Secondary | ICD-10-CM

## 2021-08-28 DIAGNOSIS — I10 Essential (primary) hypertension: Secondary | ICD-10-CM | POA: Diagnosis present

## 2021-08-28 LAB — CBC WITH DIFFERENTIAL/PLATELET
Abs Immature Granulocytes: 0.04 10*3/uL (ref 0.00–0.07)
Basophils Absolute: 0 10*3/uL (ref 0.0–0.1)
Basophils Relative: 1 %
Eosinophils Absolute: 0.2 10*3/uL (ref 0.0–0.5)
Eosinophils Relative: 3 %
HCT: 33.1 % — ABNORMAL LOW (ref 39.0–52.0)
Hemoglobin: 9.6 g/dL — ABNORMAL LOW (ref 13.0–17.0)
Immature Granulocytes: 1 %
Lymphocytes Relative: 37 %
Lymphs Abs: 1.8 10*3/uL (ref 0.7–4.0)
MCH: 24.8 pg — ABNORMAL LOW (ref 26.0–34.0)
MCHC: 29 g/dL — ABNORMAL LOW (ref 30.0–36.0)
MCV: 85.5 fL (ref 80.0–100.0)
Monocytes Absolute: 0.5 10*3/uL (ref 0.1–1.0)
Monocytes Relative: 9 %
Neutro Abs: 2.4 10*3/uL (ref 1.7–7.7)
Neutrophils Relative %: 49 %
Platelets: 344 10*3/uL (ref 150–400)
RBC: 3.87 MIL/uL — ABNORMAL LOW (ref 4.22–5.81)
RDW: 14.7 % (ref 11.5–15.5)
WBC: 5 10*3/uL (ref 4.0–10.5)
nRBC: 0 % (ref 0.0–0.2)

## 2021-08-28 LAB — BASIC METABOLIC PANEL
Anion gap: 6 (ref 5–15)
Anion gap: 6 (ref 5–15)
BUN: 27 mg/dL — ABNORMAL HIGH (ref 6–20)
BUN: 26 mg/dL — ABNORMAL HIGH (ref 6–20)
CO2: 21 mmol/L — ABNORMAL LOW (ref 22–32)
CO2: 21 mmol/L — ABNORMAL LOW (ref 22–32)
Calcium: 9.4 mg/dL (ref 8.9–10.3)
Calcium: 9.8 mg/dL (ref 8.9–10.3)
Chloride: 106 mmol/L (ref 98–111)
Chloride: 111 mmol/L (ref 98–111)
Creatinine, Ser: 1.55 mg/dL — ABNORMAL HIGH (ref 0.61–1.24)
Creatinine, Ser: 1.39 mg/dL — ABNORMAL HIGH (ref 0.61–1.24)
GFR, Estimated: 51 mL/min — ABNORMAL LOW (ref >60)
GFR, Estimated: 58 mL/min — ABNORMAL LOW (ref >60)
Glucose, Bld: 610 mg/dL (ref 70–99)
Glucose, Bld: 123 mg/dL — ABNORMAL HIGH (ref 70–99)
Potassium: 6.7 mmol/L (ref 3.5–5.1)
Potassium: 5.6 mmol/L — ABNORMAL HIGH (ref 3.5–5.1)
Sodium: 133 mmol/L — ABNORMAL LOW (ref 135–145)
Sodium: 138 mmol/L (ref 135–145)

## 2021-08-28 LAB — GLUCOSE, CAPILLARY
Glucose-Capillary: 557 mg/dL (ref 70–99)
Glucose-Capillary: 505 mg/dL (ref 70–99)

## 2021-08-28 LAB — CK: Total CK: 39 U/L — ABNORMAL LOW (ref 49–397)

## 2021-08-28 LAB — CBG MONITORING, ED
Glucose-Capillary: 90 mg/dL (ref 70–99)
Glucose-Capillary: 95 mg/dL (ref 70–99)
Glucose-Capillary: 112 mg/dL — ABNORMAL HIGH (ref 70–99)
Glucose-Capillary: 211 mg/dL — ABNORMAL HIGH (ref 70–99)

## 2021-08-28 SURGERY — ABDOMINAL AORTOGRAM W/LOWER EXTREMITY
Anesthesia: LOCAL

## 2021-08-28 MED ORDER — SODIUM BICARBONATE 8.4 % IV SOLN
50.0000 meq | Freq: Once | INTRAVENOUS | Status: AC
  Administered 2021-08-28: 50 meq via INTRAVENOUS
  Filled 2021-08-28: qty 50

## 2021-08-28 MED ORDER — SODIUM CHLORIDE 0.9 % IV SOLN
2.0000 g | Freq: Three times a day (TID) | INTRAVENOUS | Status: DC
  Administered 2021-08-29: 2 g via INTRAVENOUS
  Filled 2021-08-28: qty 12.5

## 2021-08-28 MED ORDER — DEXTROSE IN LACTATED RINGERS 5 % IV SOLN: INTRAVENOUS | Status: DC

## 2021-08-28 MED ORDER — SODIUM ZIRCONIUM CYCLOSILICATE 10 G PO PACK
10.0000 g | PACK | Freq: Once | ORAL | Status: AC
  Administered 2021-08-28: 10 g via ORAL
  Filled 2021-08-28: qty 1

## 2021-08-28 MED ORDER — SODIUM BICARBONATE 650 MG PO TABS
1300.0000 mg | ORAL_TABLET | Freq: Three times a day (TID) | ORAL | Status: DC
  Administered 2021-08-28 – 2021-08-30 (×5): 1300 mg via ORAL
  Filled 2021-08-28 (×5): qty 2

## 2021-08-28 MED ORDER — DEXTROSE 50 % IV SOLN: 0.0000 mL | INTRAVENOUS | Status: DC | PRN

## 2021-08-28 MED ORDER — ACETAMINOPHEN 500 MG PO TABS
ORAL_TABLET | ORAL | Status: AC
  Administered 2021-08-28: 1000 mg via ORAL
  Filled 2021-08-28: qty 2

## 2021-08-28 MED ORDER — TACROLIMUS 1 MG PO CAPS
4.0000 mg | ORAL_CAPSULE | Freq: Every evening | ORAL | Status: DC
  Administered 2021-08-28 – 2021-08-29 (×2): 4 mg via ORAL
  Filled 2021-08-28 (×2): qty 4

## 2021-08-28 MED ORDER — INSULIN GLARGINE-YFGN 100 UNIT/ML ~~LOC~~ SOLN
30.0000 [IU] | Freq: Two times a day (BID) | SUBCUTANEOUS | Status: DC
  Administered 2021-08-28 – 2021-08-30 (×3): 30 [IU] via SUBCUTANEOUS
  Filled 2021-08-28 (×6): qty 0.3

## 2021-08-28 MED ORDER — CALCIUM GLUCONATE-NACL 1-0.675 GM/50ML-% IV SOLN
1.0000 g | Freq: Once | INTRAVENOUS | Status: AC
  Administered 2021-08-28: 1000 mg via INTRAVENOUS
  Filled 2021-08-28: qty 50

## 2021-08-28 MED ORDER — HEPARIN SODIUM (PORCINE) 5000 UNIT/ML IJ SOLN
5000.0000 [IU] | Freq: Three times a day (TID) | INTRAMUSCULAR | Status: DC
  Administered 2021-08-28 – 2021-08-30 (×5): 5000 [IU] via SUBCUTANEOUS
  Filled 2021-08-28 (×5): qty 1

## 2021-08-28 MED ORDER — VANCOMYCIN HCL 1250 MG/250ML IV SOLN
1250.0000 mg | INTRAVENOUS | Status: DC
  Filled 2021-08-28: qty 250

## 2021-08-28 MED ORDER — ACETAMINOPHEN 500 MG PO TABS
1000.0000 mg | ORAL_TABLET | Freq: Once | ORAL | Status: AC
  Filled 2021-08-28: qty 2

## 2021-08-28 MED ORDER — MULTIVITAMINS PO CAPS: 1.0000 | ORAL_CAPSULE | Freq: Every day | ORAL | Status: DC

## 2021-08-28 MED ORDER — ONDANSETRON HCL 4 MG/2ML IJ SOLN: 4.0000 mg | Freq: Four times a day (QID) | INTRAMUSCULAR | Status: DC | PRN

## 2021-08-28 MED ORDER — SODIUM CHLORIDE 0.9 % IV SOLN: 250.0000 mL | INTRAVENOUS | Status: DC | PRN

## 2021-08-28 MED ORDER — DORZOLAMIDE HCL-TIMOLOL MAL 2-0.5 % OP SOLN
1.0000 [drp] | Freq: Two times a day (BID) | OPHTHALMIC | Status: DC
  Administered 2021-08-28 – 2021-08-30 (×4): 1 [drp] via OPHTHALMIC
  Filled 2021-08-28 (×2): qty 10

## 2021-08-28 MED ORDER — TACROLIMUS 1 MG PO CAPS
3.0000 mg | ORAL_CAPSULE | Freq: Every morning | ORAL | Status: DC
  Administered 2021-08-29: 3 mg via ORAL
  Filled 2021-08-28: qty 3

## 2021-08-28 MED ORDER — INSULIN ASPART 100 UNIT/ML IJ SOLN
20.0000 [IU] | Freq: Once | INTRAMUSCULAR | Status: AC
  Administered 2021-08-28: 20 [IU] via SUBCUTANEOUS
  Filled 2021-08-28: qty 0.2

## 2021-08-28 MED ORDER — DOXAZOSIN MESYLATE 2 MG PO TABS
4.0000 mg | ORAL_TABLET | Freq: Every day | ORAL | Status: DC
  Administered 2021-08-29 – 2021-08-30 (×2): 4 mg via ORAL
  Filled 2021-08-28 (×2): qty 1
  Filled 2021-08-28 (×2): qty 2

## 2021-08-28 MED ORDER — ACETAMINOPHEN 650 MG RE SUPP
650.0000 mg | Freq: Four times a day (QID) | RECTAL | Status: DC | PRN
Start: 2021-08-28 — End: 2021-08-30

## 2021-08-28 MED ORDER — CLONIDINE HCL 0.3 MG/24HR TD PTWK: 0.3000 mg | MEDICATED_PATCH | TRANSDERMAL | Status: DC

## 2021-08-28 MED ORDER — SODIUM CHLORIDE 0.9 % WEIGHT BASED INFUSION: 1.0000 mL/kg/h | INTRAVENOUS | Status: DC

## 2021-08-28 MED ORDER — ALBUTEROL SULFATE (2.5 MG/3ML) 0.083% IN NEBU
10.0000 mg | INHALATION_SOLUTION | Freq: Once | RESPIRATORY_TRACT | Status: AC
  Administered 2021-08-28: 10 mg via RESPIRATORY_TRACT
  Filled 2021-08-28: qty 12

## 2021-08-28 MED ORDER — ONDANSETRON HCL 4 MG PO TABS
4.0000 mg | ORAL_TABLET | Freq: Four times a day (QID) | ORAL | Status: DC | PRN
Start: 2021-08-28 — End: 2021-08-30

## 2021-08-28 MED ORDER — SODIUM CHLORIDE 0.9% FLUSH: 3.0000 mL | Freq: Two times a day (BID) | INTRAVENOUS | Status: DC

## 2021-08-28 MED ORDER — VANCOMYCIN HCL 2000 MG/400ML IV SOLN
2000.0000 mg | Freq: Once | INTRAVENOUS | Status: AC
  Administered 2021-08-28: 2000 mg via INTRAVENOUS
  Filled 2021-08-28: qty 400

## 2021-08-28 MED ORDER — SODIUM CHLORIDE 0.9 % WEIGHT BASED INFUSION: 3.0000 mL/kg/h | INTRAVENOUS | Status: DC

## 2021-08-28 MED ORDER — ATORVASTATIN CALCIUM 10 MG PO TABS: 10.0000 mg | ORAL_TABLET | Freq: Every day | ORAL | Status: DC

## 2021-08-28 MED ORDER — LATANOPROST 0.005 % OP SOLN
1.0000 [drp] | Freq: Every day | OPHTHALMIC | Status: DC
  Administered 2021-08-28: 1 [drp] via OPHTHALMIC
  Filled 2021-08-28 (×2): qty 2.5

## 2021-08-28 MED ORDER — POLYETHYLENE GLYCOL 3350 17 G PO PACK: 17.0000 g | PACK | Freq: Every day | ORAL | Status: DC | PRN

## 2021-08-28 MED ORDER — INSULIN ASPART 100 UNIT/ML IJ SOLN
0.0000 [IU] | Freq: Every day | INTRAMUSCULAR | Status: DC
  Administered 2021-08-29: 2 [IU] via SUBCUTANEOUS

## 2021-08-28 MED ORDER — TACROLIMUS 1 MG PO CAPS: 3.0000 mg | ORAL_CAPSULE | ORAL | Status: DC

## 2021-08-28 MED ORDER — HYDROCODONE-ACETAMINOPHEN 5-325 MG PO TABS
1.0000 | ORAL_TABLET | ORAL | Status: DC | PRN
  Administered 2021-08-28: 1 via ORAL
  Filled 2021-08-28: qty 1

## 2021-08-28 MED ORDER — INSULIN ASPART 100 UNIT/ML IJ SOLN
15.0000 [IU] | Freq: Once | INTRAMUSCULAR | Status: AC
  Administered 2021-08-28: 15 [IU] via SUBCUTANEOUS

## 2021-08-28 MED ORDER — LACTATED RINGERS IV SOLN: INTRAVENOUS | Status: DC

## 2021-08-28 MED ORDER — ADULT MULTIVITAMIN W/MINERALS CH
1.0000 | ORAL_TABLET | Freq: Every day | ORAL | Status: DC
  Administered 2021-08-29 – 2021-08-30 (×2): 1 via ORAL
  Filled 2021-08-28 (×2): qty 1

## 2021-08-28 MED ORDER — HYDRALAZINE HCL 20 MG/ML IJ SOLN
10.0000 mg | Freq: Four times a day (QID) | INTRAMUSCULAR | Status: DC | PRN
  Administered 2021-08-29: 10 mg via INTRAVENOUS
  Filled 2021-08-28: qty 1

## 2021-08-28 MED ORDER — HYDRALAZINE HCL 25 MG PO TABS
25.0000 mg | ORAL_TABLET | Freq: Two times a day (BID) | ORAL | Status: DC
  Administered 2021-08-28 – 2021-08-30 (×4): 25 mg via ORAL
  Filled 2021-08-28 (×4): qty 1

## 2021-08-28 MED ORDER — SODIUM CHLORIDE 0.9 % IV SOLN
2.0000 g | Freq: Once | INTRAVENOUS | Status: AC
  Administered 2021-08-28: 2 g via INTRAVENOUS
  Filled 2021-08-28: qty 12.5

## 2021-08-28 MED ORDER — INSULIN REGULAR(HUMAN) IN NACL 100-0.9 UT/100ML-% IV SOLN
INTRAVENOUS | Status: DC
  Filled 2021-08-28: qty 100

## 2021-08-28 MED ORDER — INSULIN ASPART 100 UNIT/ML IJ SOLN
0.0000 [IU] | Freq: Three times a day (TID) | INTRAMUSCULAR | Status: DC
  Administered 2021-08-29: 5 [IU] via SUBCUTANEOUS
  Administered 2021-08-29 (×2): 3 [IU] via SUBCUTANEOUS
  Administered 2021-08-30: 8 [IU] via SUBCUTANEOUS

## 2021-08-28 MED ORDER — VITAMIN D (ERGOCALCIFEROL) 1.25 MG (50000 UNIT) PO CAPS: 50000.0000 [IU] | ORAL_CAPSULE | ORAL | Status: DC

## 2021-08-28 MED ORDER — BRIMONIDINE TARTRATE 0.2 % OP SOLN
1.0000 [drp] | Freq: Three times a day (TID) | OPHTHALMIC | Status: DC
  Administered 2021-08-28 – 2021-08-30 (×4): 1 [drp] via OPHTHALMIC
  Filled 2021-08-28: qty 5

## 2021-08-28 MED ORDER — ASPIRIN 81 MG PO CHEW: 81.0000 mg | CHEWABLE_TABLET | ORAL | Status: DC

## 2021-08-28 MED ORDER — SODIUM CHLORIDE 0.9% FLUSH: 3.0000 mL | INTRAVENOUS | Status: DC | PRN

## 2021-08-28 MED ORDER — ATORVASTATIN CALCIUM 10 MG PO TABS
10.0000 mg | ORAL_TABLET | Freq: Every day | ORAL | Status: DC
  Administered 2021-08-29 – 2021-08-30 (×2): 10 mg via ORAL
  Filled 2021-08-28 (×3): qty 1

## 2021-08-28 MED ORDER — SODIUM ZIRCONIUM CYCLOSILICATE 10 G PO PACK
10.0000 g | PACK | Freq: Two times a day (BID) | ORAL | Status: DC
  Administered 2021-08-28 – 2021-08-30 (×4): 10 g via ORAL
  Filled 2021-08-28 (×4): qty 1

## 2021-08-28 MED ORDER — ACETAMINOPHEN 325 MG PO TABS: 650.0000 mg | ORAL_TABLET | Freq: Four times a day (QID) | ORAL | Status: DC | PRN

## 2021-08-28 NOTE — Progress Notes (Addendum)
CRITICAL LAB: call from lab with K+ 6.7 and glucose 610. Spoke with Eliezer Champagne RN/ cath lab, she will inform Dr and call back with further advice  ?1325 pt to be cancelled and taken to ED per Dr Fletcher Anon ?

## 2021-08-28 NOTE — Progress Notes (Addendum)
Pharmacy Antibiotic Note ? ?Raymond [name] is a 61 y.o. male admitted on 08/28/2021 with  diabetic foot infection .  Pharmacy has been consulted for vancomycin and cefepime dosing. ? ?WBC 5, no fever noted. Scr is 1.39. Will give a loading dose of vancomycin (~20 mg/kg) followed by vancomycin maintenance dose: ?Scr used: 1.39 mg/dL ?Weight: 98.4 kg  ?Vd coeff: 0.5 L/kg ?Est AUC: 526.5 ? ?Plan: ?Vancomycin 2 g IV x1 followed by vancomycin 1250 mg IV q24h  ?Cefepime 2 g IV q8h  ?F/u renal function, clinical course, fever curve ? ?Height: '5\' 7"'$  (170.2 cm) ?Weight: 98.4 kg (217 lb) ?IBW/kg (Calculated) : 66.1 ? ?Temp (24hrs), Avg:98.2 ?F (36.8 ?C), Min:98.2 ?F (36.8 ?C), Max:98.2 ?F (36.8 ?C) ? ?Recent Labs  ?Lab 08/22/21 ?0957 08/23/21 ?1056 08/24/21 ?0400 08/28/21 ?1210 08/28/21 ?1609  ?WBC  --   --  4.9  --  5.0  ?CREATININE 1.59* 1.72* 1.79* 1.55* 1.39*  ?  ?Estimated Creatinine Clearance: 63.1 mL/min (A) (by C-G formula based on SCr of 1.39 mg/dL (H)).   ? ?No Known Allergies ? ?Antimicrobials this admission: ?Cefepime 4/5 >>  ?Vancomycin 4/5 >>  ? ?Dose adjustments this admission: ?N/A ? ?Microbiology results: ?None ? ?Thank you for allowing pharmacy to be a part of this patient?s care. ? ?Zenaida Deed, PharmD ?PGY1 Acute Care Pharmacy Resident  ?Phone: [phone] ?08/28/2021  10:38 PM ? ?Please check [email or web address] for unit-specific pharmacy phone numbers. ? ? ?

## 2021-08-28 NOTE — Assessment & Plan Note (Signed)
-   Continue with home eyedrops ?

## 2021-08-28 NOTE — ED Provider Notes (Signed)
? ?Emergency Department Provider Note ? ? ?I have reviewed the triage vital signs and the nursing notes. ? ? ?HISTORY ? ?Chief Complaint ?Abnormal Lab ? ? ?HPI ?Raymond [name] is a 61 y.o. male with past history of ESRD status post transplant in 2016 (not currently on HD), diabetic foot wound on the right with wound VAC managed by podiatry, diabetes, hypertension presents with hyperglycemia and hyperkalemia.  Patient arrives from the preop area.  He was here today for arteriogram of the right lower extremity seeking treatment options for the right foot wound.  He was seen in the emergency department on 4/1 with hyperkalemia.  Nephrology was consulted and that at the time was thought to be due to Bactrim.  This was discontinued and patient was started on Cipro.  At the preop labs this morning patient found to have hyperglycemia into the 500s with potassium of greater than 6 even after discontinuing the antibiotic.  Patient received 35 units of aspart insulin with no improvement of his blood sugar and was ultimately transferred to the emergency department.  Patient is having no complaints at this time other than being hungry.  ? ?Past Medical History:  ?Diagnosis Date  ? Chronic kidney disease   ? Awaiting transplant  ? Diabetes ([place])   ? Diabetes mellitus without complication ([place])   ? Hypertension   ? Renal disorder   ? Renal insufficiency   ? ? ?Review of Systems ? ?Constitutional: No fever/chills ?Eyes: No visual changes. ?ENT: No sore throat. ?Cardiovascular: Denies chest pain. ?Respiratory: Denies shortness of breath. ?Gastrointestinal: No abdominal pain.  No nausea, no vomiting.  No diarrhea.  No constipation. ?Genitourinary: Negative for dysuria. ?Musculoskeletal: Negative for back pain. ?Skin: Positive right foot wound.  ?Neurological: Negative for headaches, focal weakness or numbness. ? ? ?____________________________________________ ? ? ?PHYSICAL EXAM: ? ?VITAL SIGNS: ?ED Triage Vitals  ?Enc Vitals Group   ?   BP 08/28/21 1040 (!) 145/71  ?   Pulse Rate 08/28/21 1040 (!) 58  ?   Resp 08/28/21 1040 17  ?   Temp 08/28/21 1040 98.2 ?F (36.8 ?C)  ?   Temp Source 08/28/21 1040 Oral  ?   SpO2 08/28/21 1040 99 %  ?   Weight 08/28/21 1040 217 lb (98.4 kg)  ?   Height 08/28/21 1040 '5\' 7"'$  (1.702 m)  ? ?Constitutional: Alert and oriented. Well appearing and in no acute distress. ?Eyes: Conjunctivae are normal.  ?Head: Atraumatic. ?Nose: No congestion/rhinnorhea. ?Mouth/Throat: Mucous membranes are moist. ?Neck: No stridor.   ?Cardiovascular: Normal rate, regular rhythm. Good peripheral circulation. Grossly normal heart sounds.   ?Respiratory: Normal respiratory effort.  No retractions. Lungs CTAB. ?Gastrointestinal: Soft and nontender. No distention.  ?Musculoskeletal: No lower extremity tenderness nor edema. No gross deformities of extremities. ?Neurologic:  Normal speech and language.  ?Skin:  Skin is warm. Wound to the plantar aspect of the right foot with discoloration to the 4th right toe and some surrounding erythema pictured below.  ? ? ? ? ? ? ? ?____________________________________________ ?  ?LABS ?(all labs ordered are listed, but only abnormal results are displayed) ? ?Labs Reviewed  ?BASIC METABOLIC PANEL - Abnormal; Notable for the following components:  ?    Result Value  ? Sodium 133 (*)   ? Potassium 6.7 (*)   ? CO2 21 (*)   ? Glucose, Bld 610 (*)   ? BUN 27 (*)   ? Creatinine, Ser 1.55 (*)   ? GFR, Estimated 51 (*)   ?  All other components within normal limits  ?GLUCOSE, CAPILLARY - Abnormal; Notable for the following components:  ? Glucose-Capillary 557 (*)   ? All other components within normal limits  ?GLUCOSE, CAPILLARY - Abnormal; Notable for the following components:  ? Glucose-Capillary 505 (*)   ? All other components within normal limits  ?CBC WITH DIFFERENTIAL/PLATELET  ?URINALYSIS, ROUTINE W REFLEX MICROSCOPIC  ?CBG MONITORING, ED  ? ?____________________________________________ ? ?EKG ? ? EKG  Interpretation ? ?Date/Time:  Wednesday August 28 2021 14:51:10 EDT ?Ventricular Rate:  60 ?PR Interval:  173 ?QRS Duration: 85 ?QT Interval:  426 ?QTC Calculation: 426 ?R Axis:   130 ?Text Interpretation: Right and left arm electrode reversal, interpretation assumes no reversal Sinus rhythm Right ventricular hypertrophy Probable lateral infarct, age indeterminate Borderline ST elevation, anterior leads Confirmed by Nanda Quinton [phone]) on 08/28/2021 2:59:47 PM ?  ? ?  ? ?____________________________________________ ? ? ?PROCEDURES ? ?Procedure(s) performed:  ? ?Procedures ? ?CRITICAL CARE ?Performed by: Margette Fast ?Total critical care time: 35 minutes ?Critical care time was exclusive of separately billable procedures and treating other patients. ?Critical care was necessary to treat or prevent imminent or life-threatening deterioration. ?Critical care was time spent personally by me on the following activities: development of treatment plan with patient and/or surrogate as well as nursing, discussions with consultants, evaluation of patient's response to treatment, examination of patient, obtaining history from patient or surrogate, ordering and performing treatments and interventions, ordering and review of laboratory studies, ordering and review of radiographic studies, pulse oximetry and re-evaluation of patient's condition. ? ?Nanda Quinton, MD ?Emergency Medicine ? ?____________________________________________ ? ? ?INITIAL IMPRESSION / ASSESSMENT AND PLAN / ED COURSE ? ?Pertinent labs & imaging results that were available during my care of the patient were reviewed by me and considered in my medical decision making (see chart for details). ?  ?This patient is Presenting for Evaluation of hyperglycemia/hyperkalemia, which does require a range of treatment options, and is a complaint that involves a high risk of morbidity and mortality. ? ?The Differential Diagnoses include renal failure, DKA, HHS, dehydration,  worsening infection. ? ?Critical Interventions-  ?  ?Medications  ?0.9% sodium chloride infusion (3 mL/kg/hr ? 98.4 kg Intravenous Not Given 08/28/21 1436)  ?sodium zirconium cyclosilicate (LOKELMA) packet 10 g (has no administration in time range)  ?calcium gluconate 1 g/ 50 mL sodium chloride IVPB (has no administration in time range)  ?albuterol (PROVENTIL) (2.5 MG/3ML) 0.083% nebulizer solution 10 mg (has no administration in time range)  ?sodium bicarbonate injection 50 mEq (has no administration in time range)  ?insulin regular, human (MYXREDLIN) 100 units/ 100 mL infusion (has no administration in time range)  ?lactated ringers infusion (has no administration in time range)  ?dextrose 5 % in lactated ringers infusion (has no administration in time range)  ?dextrose 50 % solution 0-50 mL (has no administration in time range)  ?acetaminophen (TYLENOL) tablet 1,000 mg (1,000 mg Oral Given 08/28/21 1057)  ?insulin aspart (novoLOG) injection 20 Units (20 Units Subcutaneous Given 08/28/21 1057)  ?insulin aspart (novoLOG) injection 15 Units (15 Units Subcutaneous Given 08/28/21 1209)  ? ? ?Reassessment after intervention:  CBG has normalized on re-check here. Holding insulin infusion. Will repeat BMP. ? ? ?I did obtain Additional Historical Information from wife at bedside. ? ?I decided to review pertinent External Data, and in summary Podiatry note and nursing notes reviewed from this AM. ?  ?Clinical Laboratory Tests Ordered, included noon labs showing hyperkalemia and continued hyperkalemia to 610. ? ?  Cardiac Monitor Tracing which shows NSR. No ectopy.  ? ? ?Social Determinants of Health Risk patient is a non-smoker. ? ?Consult complete with Nephrology, Dr. Hollie Salk. They will consult but plan for obs admit with TRH for hyperkalemia. ? ?Discussed case with Hospitalist. Plan for admit.  ? ?Medical Decision Making: Summary:  ?Patient presents from preop with hyperkalemia and hyperglycemia.  We will repeat basic metabolic  panel and reach out to nephrology.  Sounds like several days ago in the ED they thought this may be due to Bactrim however patient has stopped this and lab abnormalities persist.  Kidney function is unchanged

## 2021-08-28 NOTE — Assessment & Plan Note (Signed)
-  Patient's hemoglobin/hematocrit is now 9.6/33.1 ?-Check anemia panel in the a.m. ?-Continue to monitor for signs and symptoms bleeding; no overt bleeding noted ?-Repeat CBC in a.m. ?

## 2021-08-28 NOTE — Assessment & Plan Note (Signed)
-  With Hyperglycemia ?-Patient's blood sugar likely uncontrolled in the setting of his missed insulin dosing last night as he was told not to take any insulin for his arteriogram this morning ?-Perioperative labs showed significant hyperglycemia and is given insulin in the ED and then also placed on insulin drip however we discontinued insulin drip and started him on Semglee 30 units twice daily with a moderate NovoLog sign scale insulin AC ?-Continue monitor blood sugars carefully and per protocol ?-Adjust insulin regimen as necessary ?-Consulted diabetes education coronary for further evaluation recommendations ?

## 2021-08-28 NOTE — ED Triage Notes (Signed)
Scheduled to go to OR today for arteriogram. Pt sent by OR due to CBG of 610 and K of over 6. Pt reports taking insulin last night. Total of 35units Novolog given in the OR prior to sending pt to ED. Reports recent antibiotic change as abx was making pt K elevated. Alert and oriented x 4.  ?

## 2021-08-28 NOTE — Progress Notes (Signed)
Patient came in with a blood sugar of 557. Dr called and patient was given 20 units and rechecked in an hour. Patient blood sugar was 505 after recheck. Patient was ordered an additional 15 units. Will recheck in an hour. ?

## 2021-08-28 NOTE — Assessment & Plan Note (Signed)
-  Complicates overall prognosis and care ?-Estimated body mass index is 33.99 kg/m? as calculated from the following: ?  Height as of this encounter: '5\' 7"'$  (1.702 m). ?  Weight as of this encounter: 98.4 kg.  ?-Weight Loss and Dietary Counseling given ? ?

## 2021-08-28 NOTE — Assessment & Plan Note (Signed)
-   Sees Dr. Nelson Chimes at [facility] in [place] ?-Patient's BUNs/creatinine went from 27/1.55 and is now 26/1.39 ?-Avoid further nephrotoxic medications if possible, contrast dyes, hypotension and renally dose medications nephrology's been consulted for further evaluation recommendations ?-Repeat CMP in a.m. ?

## 2021-08-28 NOTE — Assessment & Plan Note (Signed)
-   Has a history of renal transplant in 2016 and is on Prograf ?-Continue his Prograf for now and have nephrology evaluate for adjustments ? ?

## 2021-08-28 NOTE — Assessment & Plan Note (Signed)
-  Notified Podiatry about Patient's Admission ?-Noted to have strep agalactiae and staph lugdunensis on his wound culture.  We will stop his p.o. antibiotics and changed to IV vancomycin for now and may benefit from an ID consult ?-Patient was supposed to undergo an arteriogram today but this was held given his preoperative labs.  Have asked cardiology to evaluate while he is in-house to see if they can do the arteriogram here ?

## 2021-08-28 NOTE — Assessment & Plan Note (Signed)
See above

## 2021-08-28 NOTE — Assessment & Plan Note (Signed)
-   Question if this is antibiotic mediated and induced ?-Was on Bactrim and had a hypokalemia on ED visit on 08/24/2021 ?-Presented for a arteriogram today for lower extremity evaluation for wound healing but this was canceled given his hyperkalemia ?-Potassium was 6.7 and question if this could be related to his p.o. antibiotics of Augmentin and Cipro now ?-Hold his p.o. antibiotics and changed to IV vancomycin while in house and may discuss with ID about further antibiotic management ?-Nephrology is consulting and recommending given another dose of Lokelma just been ordered ?-placed on telemetry given his hyperkalemia ?-Continue monitor and trend and repeat potassium in the a.m. ?

## 2021-08-28 NOTE — Assessment & Plan Note (Signed)
-   Does not appear to be on a PPI we will order 1 if necessary ?

## 2021-08-28 NOTE — H&P (Signed)
?History and Physical  ? ? ?Patient: Raymond [name] KJZ:[id number] DOB: 10-05-1960 ?DOA: 08/28/2021 ?DOS: the patient was seen and examined on 08/28/2021 ?PCP: [name], FNP  ?Patient coming from: Home ? ?Chief Complaint:  ?Chief Complaint  ?Patient presents with  ? Abnormal Lab  ? ?HPI: Raymond [name] is an obese African-American 61 y.o. male with medical history significant for but not limited too ESRD status post kidney transplant in 2016 currently not on HD now with CKD stage IIIa, recent history of diabetic foot wound with right wound VAC managed by podiatry, diabetes mellitus type 2 insulin-dependent, hypertension, hyper lipidemia as well as other comorbidities presented today for an outpatient arteriogram on the right lower extremity to see option for his right foot wound.  Recently seen in the ED on 08/24/2021 with hyperkalemia as his nephrologist called and told him that he needed to go to the ED for further management.  Nephrology was consulted at that time and they thought the patient's hyperkalemia could have been due to his Bactrim so he was changed to Cipro.  Patient was placed on Augmentin and Bactrim at the time of discharge given that he grew out strep agalactiae and staph lugdunensis from his wound infection.  Today presented for preoperative labs and was found to have a significant hyperglycemia and hyperkalemia even after discontinuing the Bactrim.  Received quite a bit of insulin and was transferred to the ED for further evaluation.  Patient is asymptomatic and denies any chest pain, shortness breath, lightheadedness or dizziness.  Continues have some pain in his right foot but states it is manageable.  Nephrology was consulted for further evaluation of his hyperkalemia.  Patient was placed in observation and his potassium was brought down by aggressive measures by the ED physician.  Patient will get another dose of Lokelma later this evening and have repeat blood work in the morning.  We have  notified the podiatry team of the patient's admission given that he has an appointment with Dr. Cannon Kettle in the morning. ? ?Review of Systems: As mentioned in the history of present illness. All other systems reviewed and are negative. ? ?Past Medical History:  ?Diagnosis Date  ? Chronic kidney disease   ? Awaiting transplant  ? Diabetes ([place])   ? Diabetes mellitus without complication ([place])   ? Hypertension   ? Renal disorder   ? Renal insufficiency   ? ?Past Surgical History:  ?Procedure Laterality Date  ? BONE BIOPSY Right 03/06/2021  ? Procedure: BONE BIOPSY;  Surgeon: Trula Slade, DPM;  Location: [facility];  Service: Podiatry;  Laterality: Right;  ? CENTRAL VENOUS CATHETER INSERTION Right 01/20/2020  ? Procedure: INSERTION CENTRAL LINE ADULT; Tunneled central line;  Surgeon: Virl Cagey, MD;  Location: [facility];  Service: General;  Laterality: Right;  ? COLONOSCOPY N/A 12/05/2014  ? XYI:[id number] external and internal hemorrhoid/mild diverticulosis/11 polyps removed  ? ESOPHAGOGASTRODUODENOSCOPY N/A 12/05/2014  ? MOL:MBEM duodenitis  ? GRAFT APPLICATION Right 75/44/9201  ? Procedure: GRAFT APPLICATION;  Surgeon: Trula Slade, DPM;  Location: [facility];  Service: Podiatry;  Laterality: Right;  ? INCISION AND DRAINAGE OF WOUND Right 08/15/2021  ? Procedure: IRRIGATION AND DEBRIDEMENT WOUND;  Surgeon: Landis Martins, DPM;  Location: [facility];  Service: Podiatry;  Laterality: Right;  need to make card not  a irrigation and debridement card  ? IR FLUORO GUIDE CV LINE RIGHT  03/01/2019  ? IR PERC TUN PERIT CATH WO PORT S&I /IMAG  06/27/2021  ? IR REMOVAL  TUN CV CATH W/O FL  05/10/2019  ? IR REMOVAL TUN CV CATH W/O FL  02/29/2020  ? IR REMOVAL TUN CV CATH W/O FL  07/19/2021  ? IR US GUIDE VASC ACCESS RIGHT  03/01/2019  ? IR US GUIDE VASC ACCESS RIGHT  06/27/2021  ? IRRIGATION AND DEBRIDEMENT ELBOW Left 06/24/2021  ? Procedure: IRRIGATION AND DEBRIDEMENT ELBOW;  Surgeon: Lovell Sheehan, MD;  Location: [facility];   Service: Orthopedics;  Laterality: Left;  ? KIDNEY TRANSPLANT  03/27/2015  ? Penile Pump Insertion    ? TEE WITHOUT CARDIOVERSION N/A 01/17/2020  ? Procedure: TRANSESOPHAGEAL ECHOCARDIOGRAM (TEE) WITH PROPOFOL;  Surgeon: Arnoldo Lenis, MD;  [facility]: [facility];  Service: Endoscopy;  Laterality: N/A;  ? WOUND DEBRIDEMENT Right 03/06/2021  ? Procedure: DEBRIDEMENT WOUND;  Surgeon: Trula Slade, DPM;  Location: [facility];  Service: Podiatry;  Laterality: Right;  ? ?Social History:  reports that he has never smoked. He has never used smokeless tobacco. He reports that he does not drink alcohol and does not use drugs. ? ?No Known Allergies ? ?Family History  ?Problem Relation Age of Onset  ? Diabetes Mother   ? Heart disease Father   ? Colon cancer Neg Hx   ? ? ?Prior to Admission medications   ?Medication Sig Start Date End Date Taking? Authorizing Provider  ?amoxicillin-clavulanate (AUGMENTIN) 875-125 MG tablet Take 1 tablet by mouth 2 (two) times daily for 14 days. 08/18/21 09/01/21 Yes Stover, Titorya, DPM  ?atorvastatin (LIPITOR) 10 MG tablet Take 10 mg by mouth daily.   Yes [provider]  ?blood glucose meter kit and supplies KIT Dispense based on patient and insurance preference. Use up to four times daily as directed. (FOR ICD-9 250.00, 250.01). 04/11/20  Yes [name], [name], MD  ?Blood Glucose Monitoring Suppl (ACCU-CHEK GUIDE ME) w/Device KIT 1 Piece by Does not apply route as directed. 02/18/18  Yes [name], MD  ?brimonidine (ALPHAGAN) 0.2 % ophthalmic solution Place 1 drop into both eyes 3 (three) times daily. 07/25/21  Yes [provider]  ?ciprofloxacin (CIPRO) 500 MG tablet Take 1 tablet (500 mg total) by mouth 2 (two) times daily. One po bid x 7 days 08/24/21  Yes Delo, Nathaneil Canary, MD  ?cloNIDine (CATAPRES - DOSED IN MG/24 HR) 0.3 mg/24hr patch Place 1 patch (0.3 mg total) onto the skin once a week. 08/21/21  Yes Lorretta Harp, MD  ?dorzolamide-timolol (COSOPT)  22.3-6.8 MG/ML ophthalmic solution Place 1 drop into both eyes 2 (two) times daily. 07/25/21  Yes [provider]  ?doxazosin (CARDURA) 4 MG tablet Take 4 mg by mouth daily. 12/18/15  Yes [provider]  ?Dulaglutide (TRULICITY) 7.67 HA/1.9FX SOPN Inject 0.75 mg into the skin once a week. ?Patient taking differently: Inject 0.75 mg into the skin every Sunday. 03/25/21  Yes Renato Shin, MD  ?glucose blood (ACCU-CHEK GUIDE) test strip Use as instructed 4 x daily. E11.65 02/19/18  Yes [name], MD  ?hydrALAZINE (APRESOLINE) 25 MG tablet Take 25 mg by mouth 2 (two) times daily.   Yes [provider]  ?HYDROcodone-acetaminophen (NORCO/VICODIN) 5-325 MG tablet Take 1 tablet by mouth every 4 (four) hours as needed for moderate pain. 08/22/21 08/22/22 Yes Stover, Titorya, DPM  ?Insulin NPH, Human,, Isophane, (NOVOLIN N FLEXPEN) 100 UNIT/ML Kiwkpen Inject 50 Units into the skin every morning. ?Patient taking differently: Inject 60 Units into the skin in the morning and at bedtime. 06/27/21  Yes Dahal, Marlowe Aschoff, MD  ?  latanoprost (XALATAN) 0.005 % ophthalmic solution Place 1 drop into both eyes at bedtime. 07/25/21  Yes [provider]  ?Multiple Vitamin (MULTIVITAMIN) capsule Take 1 capsule by mouth daily.   Yes [provider]  ?sodium bicarbonate 650 MG tablet Take 1,300 mg by mouth 3 (three) times daily. 11/02/18  Yes [provider]  ?tacrolimus (PROGRAF) 1 MG capsule Take 3-4 mg by mouth See admin instructions. Take 3 capsules (23m) in AM & take 4 capsules (476m in PM   Yes [provider]  ?Vitamin D, Ergocalciferol, (DRISDOL) 50000 units CAPS capsule Take 50,000 Units by mouth every 30 (thirty) days.   Yes [provider]  ? ? ?Physical Exam: ?Vitals:  ? 08/28/21 1040 08/28/21 1530 08/28/21 1650  ?BP: (!) 145/71 (!) 104/56 130/62  ?Pulse: (!) 58 (!) 54 88  ?Resp: 17 15 (!) 23  ?Temp: 98.2 ?F (36.8 ?C)    ?TempSrc: Oral    ?SpO2: 99% 100% 100%   ?Weight: 98.4 kg    ?Height: '5\' 7"'  (1.702 m)    ? ?Examination: ?Physical Exam: ? ?Constitutional: WN/WD obese African-American male currently no acute distress ?Respiratory: Diminished to auscultation bilat

## 2021-08-28 NOTE — Assessment & Plan Note (Signed)
-   Continue his home clonidine 0.3 mg transdermal patch as well as hydralazine 25 mg p.o. twice daily ?

## 2021-08-28 NOTE — ED Provider Notes (Incomplete)
? ?Emergency Department Provider Note ? ? ?I have reviewed the triage vital signs and the nursing notes. ? ? ?HISTORY ? ?Chief Complaint ?Abnormal Lab ? ? ?HPI ?Raymond [name] is a 61 y.o. male ***  ? ?{**SYMPTOM/COMPLAINT  ?LOCATION (describe anatomically) ?DURATION (when did it start) ?TIMING (onset and pattern) ?SEVERITY (0-10, mild/moderate/severe) ?QUALITY (description of symptoms) ?CONTEXT (recent surgery, new meds, activity, etc.) ?MODIFYINGFACTORS (what makes it better/worse) ?ASSOCIATEDSYMPTOMS (pertinent positives and negatives)**} ? ?Past Medical History:  ?Diagnosis Date  ? Chronic kidney disease   ? Awaiting transplant  ? Diabetes ([place])   ? Diabetes mellitus without complication ([place])   ? Hypertension   ? Renal disorder   ? Renal insufficiency   ? ? ?Review of Systems ?{** Revise as appropriate then delete this line - Documentation of 10 systems OR 2 systems and "10-point ROS otherwise negative" is required ?**}Constitutional: No fever/chills ?Eyes: No visual changes. ?ENT: No sore throat. ?Cardiovascular: Denies chest pain. ?Respiratory: Denies shortness of breath. ?Gastrointestinal: No abdominal pain.  No nausea, no vomiting.  No diarrhea.  No constipation. ?Genitourinary: Negative for dysuria. ?Musculoskeletal: Negative for back pain. ?Skin: Negative for rash. ?Neurological: Negative for headaches, focal weakness or numbness. ?{**Psychiatric:  ?Endocrine:  ?Hematological/Lymphatic:  ?Allergic/Immunilogical: **} ? ?____________________________________________ ? ? ?PHYSICAL EXAM: ? ?VITAL SIGNS: ?ED Triage Vitals  ?Enc Vitals Group  ?   BP 08/28/21 1040 (!) 145/71  ?   Pulse Rate 08/28/21 1040 (!) 58  ?   Resp 08/28/21 1040 17  ?   Temp 08/28/21 1040 98.2 ?F (36.8 ?C)  ?   Temp Source 08/28/21 1040 Oral  ?   SpO2 08/28/21 1040 99 %  ?   Weight 08/28/21 1040 217 lb (98.4 kg)  ?   Height 08/28/21 1040 '5\' 7"'$  (1.702 m)  ?   Head Circumference --   ?   Peak Flow --   ?   Pain Score 08/28/21 1451 0  ?    Pain Loc --   ?   Pain Edu? --   ?   Excl. in [place]? --   ? ?{** Revise as appropriate then delete this line - 8 systems required **} ?Constitutional: Alert and oriented. Well appearing and in no acute distress. ?Eyes: Conjunctivae are normal. PERRL. EOMI. ?Head: Atraumatic. ?{**Ears:  Healthy appearing ear canals and TMs bilaterally ?**}Nose: No congestion/rhinnorhea. ?Mouth/Throat: Mucous membranes are moist.  Oropharynx non-erythematous. ?Neck: No stridor.  No meningeal signs.  {**No cervical spine tenderness to palpation.**} ?Cardiovascular: Normal rate, regular rhythm. Good peripheral circulation. Grossly normal heart sounds.   ?Respiratory: Normal respiratory effort.  No retractions. Lungs CTAB. ?Gastrointestinal: Soft and nontender. No distention.  ?{**Genitourinary:  ?**}Musculoskeletal: No lower extremity tenderness nor edema. No gross deformities of extremities. ?Neurologic:  Normal speech and language. No gross focal neurologic deficits are appreciated.  ?Skin:  Skin is warm, dry and intact. No rash noted. ?{**Psychiatric: Mood and affect are normal. Speech and behavior are normal.**} ? ?____________________________________________ ?  ?LABS ?(all labs ordered are listed, but only abnormal results are displayed) ? ?Labs Reviewed  ?BASIC METABOLIC PANEL - Abnormal; Notable for the following components:  ?    Result Value  ? Sodium 133 (*)   ? Potassium 6.7 (*)   ? CO2 21 (*)   ? Glucose, Bld 610 (*)   ? BUN 27 (*)   ? Creatinine, Ser 1.55 (*)   ? GFR, Estimated 51 (*)   ? All other components within normal limits  ?GLUCOSE, CAPILLARY -  Abnormal; Notable for the following components:  ? Glucose-Capillary 557 (*)   ? All other components within normal limits  ?GLUCOSE, CAPILLARY - Abnormal; Notable for the following components:  ? Glucose-Capillary 505 (*)   ? All other components within normal limits   ? ?____________________________________________ ? ?EKG ? ?*** ?____________________________________________ ? ?RADIOLOGY ? ?No results found. ? ?____________________________________________ ? ? ?PROCEDURES ? ?Procedure(s) performed:  ? ?Procedures ? ? ?____________________________________________ ? ? ?INITIAL IMPRESSION / ASSESSMENT AND PLAN / ED COURSE ? ?Pertinent labs & imaging results that were available during my care of the patient were reviewed by me and considered in my medical decision making (see chart for details). ?  ?This patient is Presenting for Evaluation of ***, which {Range:23949} require a range of treatment options, and {MDMcomplaint:23950} a complaint that involves a {MDMlevelrisk:23951} risk of morbidity and mortality. ? ?The Differential Diagnoses include***. ? ?Critical Interventions-  ?  ?Medications  ?0.9% sodium chloride infusion (3 mL/kg/hr ? 98.4 kg Intravenous Not Given 08/28/21 1436)  ?acetaminophen (TYLENOL) tablet 1,000 mg (1,000 mg Oral Given 08/28/21 1057)  ?insulin aspart (novoLOG) injection 20 Units (20 Units Subcutaneous Given 08/28/21 1057)  ?insulin aspart (novoLOG) injection 15 Units (15 Units Subcutaneous Given 08/28/21 1209)  ? ? ?Reassessment after intervention:   ? ? ?I *** Additional Historical Information from ***, as the patient is ***. ? ?I decided to review pertinent External Data, and in summary ***. ?  ?Clinical Laboratory Tests Ordered, included  ? ?Radiologic Tests Ordered, included ***. I independently interpreted the images and agree with radiology interpretation.  ? ?Cardiac Monitor Tracing which shows *** ? ? ?Social Determinants of Health Risk *** ? ?Consult complete with ? ?Medical Decision Making: Summary: *** ? ?Reevaluation with update and discussion with  ? ?Disposition:  ? ?____________________________________________ ? ?FINAL CLINICAL IMPRESSION(S) / ED DIAGNOSES ? ?Final diagnoses:  ?None  ? ? ? ?NEW OUTPATIENT MEDICATIONS STARTED DURING THIS VISIT: ? ?New  Prescriptions  ? No medications on file  ? ? ?Note:  This document was prepared using Dragon voice recognition software and may include unintentional dictation errors. ? ?Nanda Quinton, MD, FACEP ?Emergency Medicine ? ?

## 2021-08-28 NOTE — Assessment & Plan Note (Signed)
-   Nephrology consulted for further evaluation recommendations ?

## 2021-08-28 NOTE — Progress Notes (Signed)
Brief note: ? ?- called by ED- scheduled for arteriogram and had K of 6.7 on preop labs along with glucose of > 600 ?- had been with K in low 6s previously ?- aggressive rx, K 5.6 and now glucose 90s ?- likely admit to [facility] to continue treating K ?- will give another dose of Lokelma today ?- c/s note to follow.   ? ?Madelon Lips MD ? ?

## 2021-08-29 ENCOUNTER — Other Ambulatory Visit: Payer: Self-pay

## 2021-08-29 ENCOUNTER — Telehealth: Payer: Self-pay | Admitting: Sports Medicine

## 2021-08-29 ENCOUNTER — Encounter (HOSPITAL_COMMUNITY): Payer: Self-pay | Admitting: Internal Medicine

## 2021-08-29 ENCOUNTER — Inpatient Hospital Stay: Payer: Self-pay | Admitting: Nurse Practitioner

## 2021-08-29 ENCOUNTER — Other Ambulatory Visit (HOSPITAL_COMMUNITY): Payer: Self-pay

## 2021-08-29 DIAGNOSIS — E1165 Type 2 diabetes mellitus with hyperglycemia: Secondary | ICD-10-CM

## 2021-08-29 DIAGNOSIS — Z862 Personal history of diseases of the blood and blood-forming organs and certain disorders involving the immune mechanism: Secondary | ICD-10-CM

## 2021-08-29 DIAGNOSIS — E08621 Diabetes mellitus due to underlying condition with foot ulcer: Secondary | ICD-10-CM

## 2021-08-29 DIAGNOSIS — N189 Chronic kidney disease, unspecified: Secondary | ICD-10-CM

## 2021-08-29 DIAGNOSIS — K2100 Gastro-esophageal reflux disease with esophagitis, without bleeding: Secondary | ICD-10-CM

## 2021-08-29 DIAGNOSIS — L97401 Non-pressure chronic ulcer of unspecified heel and midfoot limited to breakdown of skin: Secondary | ICD-10-CM

## 2021-08-29 LAB — CBC WITH DIFFERENTIAL/PLATELET
Abs Immature Granulocytes: 0.03 10*3/uL (ref 0.00–0.07)
Basophils Absolute: 0 10*3/uL (ref 0.0–0.1)
Basophils Relative: 1 %
Eosinophils Absolute: 0.2 10*3/uL (ref 0.0–0.5)
Eosinophils Relative: 3 %
HCT: 31.3 % — ABNORMAL LOW (ref 39.0–52.0)
Hemoglobin: 9.3 g/dL — ABNORMAL LOW (ref 13.0–17.0)
Immature Granulocytes: 1 %
Lymphocytes Relative: 26 %
Lymphs Abs: 1.5 10*3/uL (ref 0.7–4.0)
MCH: 24.9 pg — ABNORMAL LOW (ref 26.0–34.0)
MCHC: 29.7 g/dL — ABNORMAL LOW (ref 30.0–36.0)
MCV: 83.9 fL (ref 80.0–100.0)
Monocytes Absolute: 0.4 10*3/uL (ref 0.1–1.0)
Monocytes Relative: 7 %
Neutro Abs: 3.8 10*3/uL (ref 1.7–7.7)
Neutrophils Relative %: 62 %
Platelets: 336 10*3/uL (ref 150–400)
RBC: 3.73 MIL/uL — ABNORMAL LOW (ref 4.22–5.81)
RDW: 14.8 % (ref 11.5–15.5)
WBC: 5.9 10*3/uL (ref 4.0–10.5)
nRBC: 0 % (ref 0.0–0.2)

## 2021-08-29 LAB — COMPREHENSIVE METABOLIC PANEL
ALT: 14 U/L (ref 0–44)
AST: 13 U/L — ABNORMAL LOW (ref 15–41)
Albumin: 2.1 g/dL — ABNORMAL LOW (ref 3.5–5.0)
Alkaline Phosphatase: 90 U/L (ref 38–126)
Anion gap: 8 (ref 5–15)
BUN: 27 mg/dL — ABNORMAL HIGH (ref 6–20)
CO2: 21 mmol/L — ABNORMAL LOW (ref 22–32)
Calcium: 9.2 mg/dL (ref 8.9–10.3)
Chloride: 108 mmol/L (ref 98–111)
Creatinine, Ser: 1.33 mg/dL — ABNORMAL HIGH (ref 0.61–1.24)
GFR, Estimated: >60 mL/min (ref >60)
Glucose, Bld: 203 mg/dL — ABNORMAL HIGH (ref 70–99)
Potassium: 5 mmol/L (ref 3.5–5.1)
Sodium: 137 mmol/L (ref 135–145)
Total Bilirubin: 0.3 mg/dL (ref 0.3–1.2)
Total Protein: 7.8 g/dL (ref 6.5–8.1)

## 2021-08-29 LAB — GLUCOSE, CAPILLARY
Glucose-Capillary: 242 mg/dL — ABNORMAL HIGH (ref 70–99)
Glucose-Capillary: 152 mg/dL — ABNORMAL HIGH (ref 70–99)
Glucose-Capillary: 231 mg/dL — ABNORMAL HIGH (ref 70–99)

## 2021-08-29 LAB — PHOSPHORUS: Phosphorus: 3.5 mg/dL (ref 2.5–4.6)

## 2021-08-29 LAB — CBG MONITORING, ED
Glucose-Capillary: 187 mg/dL — ABNORMAL HIGH (ref 70–99)
Glucose-Capillary: 224 mg/dL — ABNORMAL HIGH (ref 70–99)

## 2021-08-29 LAB — MAGNESIUM: Magnesium: 1.3 mg/dL — ABNORMAL LOW (ref 1.7–2.4)

## 2021-08-29 MED ORDER — LINEZOLID 600 MG PO TABS
600.0000 mg | ORAL_TABLET | Freq: Two times a day (BID) | ORAL | Status: DC
  Administered 2021-08-29 – 2021-08-30 (×3): 600 mg via ORAL
  Filled 2021-08-29 (×3): qty 1

## 2021-08-29 MED ORDER — AMOXICILLIN-POT CLAVULANATE 875-125 MG PO TABS
1.0000 | ORAL_TABLET | Freq: Two times a day (BID) | ORAL | Status: DC
  Administered 2021-08-29 – 2021-08-30 (×3): 1 via ORAL
  Filled 2021-08-29 (×3): qty 1

## 2021-08-29 MED ORDER — TACROLIMUS 1 MG PO CAPS
4.0000 mg | ORAL_CAPSULE | Freq: Every morning | ORAL | Status: DC
  Administered 2021-08-30: 4 mg via ORAL
  Filled 2021-08-29: qty 4

## 2021-08-29 MED ORDER — TACROLIMUS 1 MG PO CAPS
1.0000 mg | ORAL_CAPSULE | Freq: Once | ORAL | Status: AC
  Administered 2021-08-29: 1 mg via ORAL
  Filled 2021-08-29: qty 1

## 2021-08-29 NOTE — ED Notes (Signed)
PT at [facility], breakfast delivered ?

## 2021-08-29 NOTE — Telephone Encounter (Signed)
Relayed message to pts wife. She said thank you for letting them know. ?

## 2021-08-29 NOTE — ED Notes (Signed)
Up to floor via stretcher. Wife present. Aleret, NAD, calm, no changes, denies needs or complaints.  ?

## 2021-08-29 NOTE — Telephone Encounter (Signed)
Called pt and spoke to him and wife he states the doctor took off the dressing and no one has came back in to re wrap it. He is just sitting there with wound open. I have scheduled pt to see Dr Cannon Kettle 4.13  @ 215 and they are aware. They are wanting someone from our office to come to the hospital and I told him we have to be consulted to come to the hospital. He will discuss with the doctor at the hospital. ?He then asked if I knew if Dr Gwenlyn Found (Cardiology)was going to do the surgery today and I told him he would have to contact that doctor. ?His appt for today was canceled. ?

## 2021-08-29 NOTE — Plan of Care (Signed)
  Problem: Coping: Goal: Level of anxiety will decrease Outcome: Progressing   Problem: Pain Managment: Goal: General experience of comfort will improve Outcome: Progressing   

## 2021-08-29 NOTE — Consult Note (Signed)
?  Raymond [name] for Infectious Disease  ? ? ?Date of Admission:  08/28/2021    ? ?Reason for Consult: Diabetic foot infection ?    ?Referring Physician: Dr Tana Coast ? ?Current antibiotics: ?Cefepime and Vancomycin 4/5 -- present ? ?ASSESSMENT:   ? ?61 y.o. male admitted with: ? ?Diabetic foot wound: Status post I&D on 3/23 with podiatry (Cultures = GBS and Staph lugdunensis) with infection tracking deep to muscles and tendon.  No evidence of OM was seen on MRI and he was being treated with oral antibiotics with Augmentin and TMP SMX (switched to Cipro 3/30 due to hyperkalemia concerns with TMP SMX) prior to admission.   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  ?ESRD s/p DDKT: Completed in 2016.  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  Baseline creatinine ~ 1.3-1.5. ?Uncontrolled DM: Presenting with glucose > 600 and potassium > 6.  A1c on 3/22 was 10.4. ?Hyperkalemia ? ?RECOMMENDATIONS:   ? ?Will transition back to oral antibiotics as infection seems under control and he is admitted for metabolic derangements.  Doubt Augmentin or Cipro were contributing to his hyperkalemia ?Suspect a mixed aerobic/anaerobic infection ?Will plan for linezolid and amoxicillin-clavulanate ?Stop IV vancomycin and cefepime ?Needs better glycemic control as this is contributing to his frequent infections and poor wound healing ?Wound care and off loading per podiatry ?I think he is still at high risk of future limb loss due to this wound ?Will follow ? ? ?Principal Problem: ?  Hyperkalemia ?Active Problems: ?  GERD (gastroesophageal reflux disease) ?  Immunosuppression ([place]) ?  Type 2 diabetes mellitus ([place]) ?  Essential hypertension, benign ?  History of renal transplant ?  Infected wound ?  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  Diabetic foot ulcer ([place]) ?  CKD (chronic kidney disease)  stage 3, GFR 30-59 ml/min (HCC) ?  Obesity (BMI 30-39.9) ?  History of anemia due to chronic kidney disease ? ? ?MEDICATIONS:   ? ?Scheduled Meds: ?? atorvastatin  10 mg Oral Daily  ?? brimonidine  1 drop Both Eyes TID  ?? [START ON 09/04/2021] cloNIDine  0.3 mg Transdermal Weekly  ?? dorzolamide-timolol  1 drop Both Eyes BID  ?? doxazosin  4 mg Oral Daily  ?? heparin  5,000 Units Subcutaneous Q8H  ?? hydrALAZINE  25 mg Oral BID  ?? insulin aspart  0-15 Units Subcutaneous TID WC  ?? insulin aspart  0-5 Units Subcutaneous QHS  ?? insulin glargine-yfgn  30 Units Subcutaneous BID  ?? latanoprost  1 drop Both Eyes QHS  ?? multivitamin with minerals  1 tablet Oral Daily  ?? sodium bicarbonate  1,300 mg Oral TID  ?? sodium zirconium cyclosilicate  10 g Oral BID  ?? tacrolimus  3 mg Oral q AM  ?? tacrolimus  4 mg Oral QPM  ?? [START ON 09/23/2021] Vitamin D (Ergocalciferol)  50,000 Units Oral Q30 days  ? ?Continuous Infusions: ?? ceFEPime (MAXIPIME) IV Stopped (08/29/21 0547)  ?? vancomycin    ? ?PRN Meds:.acetaminophen **OR** acetaminophen, dextrose, hydrALAZINE, HYDROcodone-acetaminophen, ondansetron **OR** ondansetron (ZOFRAN) IV, polyethylene glycol ? ?HPI:   ? ?Raymond [name] is a 61 y.o. male with multiple comorbidities admitted to the hospital yesterday afternoon after presenting for an outpatient arteriogram of the right lower extremity found to have severe hyperglycemia and potassium 6.7. ? ?Patient's history includes uncontrolled diabetes and peripheral neuropathy.  He has  been followed previously for problems off-and-on with diabetic foot ulcers.  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  He was treated with a long course of IV daptomycin at that time.  In August 2022 he was evaluated by podiatry and noted to have a worsening appearing wound.  This was treated by serial debridement.  MRI on 02/22/2021 noted edema in the lateral sesamoid  concerning for possible osteomyelitis and right first metatarsal joint effusion without obvious septic arthritis or OM.  He had debridement on 03/06/2021 with OR cultures indicating normal skin flora.  Pathology noted that there was no osteomyelitis identified.  He was treated at that time with linezolid and Augmentin by my partner, Dr. Megan Salon.  He continues to see wound care as well.  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  This was treated with ceftriaxone x4 weeks.  At that time he had a callus with a small opening in the center of his previous right foot infection wound.  This did not appear infected at that time. ? ?More recently, patient was admitted at [name] long hospital from 3/22 to 3/26 after presenting with significant right foot swelling.  He had an MRI of the foot that showed extensive soft tissue swelling of the foot with small pockets of fluid within the deep intrinsic musculature without osteomyelitis identified.  Podiatry was consulted and he underwent incision and drainage with debridement and wound cultures by Dr. Cannon Kettle on 3/23.  He was discharged on Augmentin x 14 days after initial OR cultures grew group B strep.  Blood cultures were negative.  Following discharge his cultures also isolated staph lugdunensis.  Bactrim was added 3/27 for 14 days to his antibiotic regimen.  He continued to follow-up with podiatry and wound care and had a wound VAC in place. ? ?Patient saw Dr. Alvester Chou on 3/29 who felt that he would benefit from angiography with limited dye and potential endovascular therapy to improve blood flow in an attempt at limb salvage. ? ?Patient then presented to the emergency department on 4/1 at the request of his kidney transplant doctors to evaluate for an elevated potassium level of greater than 6.  At the time of his ED evaluation his potassium was 5.8, creatinine 1.7,  glucose 371.  It was felt that his potassium was elevated secondary to Bactrim.  The emergency department reviewed his previous cultures and changed him to ciprofloxacin.  He was given a dose of Lokelma in the ER and discharged. ? ?As noted, he presented for outpatient arteriogram yesterday and was found to be hyperkalemic at 6.7 with glucose of 610 and creatinine 1.5.  He was subsequently admitted through the emergency department.  He was started on vancomycin and cefepime.  We have been consulted for further antibiotic recommendations.  Patient has a wound VAC on his plantar aspect of right foot.  His wife has been providing wound care.  They state that this has been stable.  He also has an ischemic fourth toe that has a necrotic appearance that she reports has worsened since his hospitalization at [facility].  Patient reports that his blood sugars at home have been well controlled however the night before his procedure he was told to hold his insulin.  He knew his sugar would be high because his vision was blurry the day of his procedure so  he was not surprised that it was elevated. ? ? ?Past Medical History:  ?Diagnosis Date  ?? Chronic kidney disease   ? Awaiting transplant  ?? Diabetes ([place])   ?? Diabetes mellitus without complication ([place])   ?? Hypertension   ?? Renal disorder   ?? Renal insufficiency   ? ? ?Social History  ? ?Tobacco Use  ?? Smoking status: Never  ?? Smokeless tobacco: Never  ?Vaping Use  ?? Vaping Use: Never used  ?Substance Use Topics  ?? Alcohol use: No  ?  Alcohol/week: 0.0 standard drinks  ?? Drug use: No  ? ? ?Family History  ?Problem Relation Age of Onset  ?? Diabetes Mother   ?? Heart disease Father   ?? Colon cancer Neg Hx   ? ? ?No Known Allergies ? ?Review of Systems  ?Constitutional: Negative.   ?Eyes:  Positive for blurred vision.  ?Respiratory: Negative.    ?Cardiovascular: Negative.   ?Gastrointestinal: Negative.   ?Musculoskeletal:  Positive for joint pain.  ?Skin: Negative.    ?     + Wound on right foot and dusky 4th toe  ?All other systems reviewed and are negative. ? ?OBJECTIVE:  ? ?Blood pressure (!) 170/92, pulse 67, temperature 98.2 ?F (36.8 ?C), temperature source Oral, re

## 2021-08-29 NOTE — ED Notes (Signed)
Pt ambulatory to b/r with PT earlier. Steady gait. Denies pain, sob, nausea, dizziness. States, "ready to go home". Wife at [facility]. Meds requested from pharmacy.  ?

## 2021-08-29 NOTE — Evaluation (Signed)
Physical Therapy Evaluation and discharge ?Patient Details ?Name: Raymond [name] ?MRN: [id number] ?DOB: 09-Aug-1960 ?Today's Date: 08/29/2021 ? ?History of Present Illness ? Pt is a 61 y/o male admitted secondary to hyperkalemia and hyperglycemia. Was scheduled for arteriogram, but was unable secondary to labs. Pt with recent I and D of R foot ulcer with wound vac placement. PMH includes DM, HTN, ESRD s/p kidney transplant.  ?Clinical Impression ? Patient evaluated by Physical Therapy with no further acute PT needs identified. All education has been completed and the patient has no further questions. Pt overall at a mod I to independent level with mobility tasks. No LOB noted. Pt did not want to use RW, but able to maintain appropriate WB precautions. Educated about mobility during hospitalization to prevent deconditioning. Reports wife can assist if needed. See below for any follow-up Physical Therapy or equipment needs. PT is signing off. Thank you for this referral. If needs change, please re-consult.  ?   ?   ? ?Recommendations for follow up therapy are one component of a multi-disciplinary discharge planning process, led by the attending physician.  Recommendations may be updated based on patient status, additional functional criteria and insurance authorization. ? ?Follow Up Recommendations No PT follow up ? ?  ?Assistance Recommended at Discharge PRN  ?Patient can return home with the following ? Assist for transportation;Assistance with cooking/housework ? ?  ?Equipment Recommendations None recommended by PT  ?Recommendations for Other Services ?    ?  ?Functional Status Assessment Patient has not had a recent decline in their functional status  ? ?  ?Precautions / Restrictions Precautions ?Precautions: Other (comment) ?Precaution Comments: wound vac ?Required Braces or Orthoses: Other Brace ?Other Brace: post op shoe ?Restrictions ?Weight Bearing Restrictions: Yes ?RLE Weight Bearing: Partial weight  bearing ?RLE Partial Weight Bearing Percentage or Pounds: heel only  ? ?  ? ?Mobility ? Bed Mobility ?Overal bed mobility: Independent ?  ?  ?  ?  ?  ?  ?  ?  ? ?Transfers ?Overall transfer level: Independent ?  ?  ?  ?  ?  ?  ?  ?  ?General transfer comment: Pt refusing to use RW, but maintaining WB precautions appropriately. ?  ? ?Ambulation/Gait ?Ambulation/Gait assistance: Modified independent (Device/Increase time) ?Gait Distance (Feet): 50 Feet ?Assistive device: None ?Gait Pattern/deviations: Step-to pattern ?Gait velocity: mildly slower ?  ?  ?General Gait Details: Mildly slower speed. Pt refusing to use RW, but able to maintain weightbearing precautions on heel appropriately without. ? ?Stairs ?  ?  ?  ?  ?  ? ?Wheelchair Mobility ?  ? ?Modified Rankin (Stroke Patients Only) ?  ? ?  ? ?Balance Overall balance assessment: No apparent balance deficits (not formally assessed) ?  ?  ?  ?  ?  ?  ?  ?  ?  ?  ?  ?  ?  ?  ?  ?  ?  ?  ?   ? ? ? ?Pertinent Vitals/Pain Pain Assessment ?Pain Assessment: 0-10 ?Pain Score: 2  ?Pain Location: R foot ?Pain Descriptors / Indicators: Discomfort ?Pain Intervention(s): Monitored during session, Limited activity within patient's tolerance, Repositioned  ? ? ?Home Living Family/patient expects to be discharged to:: Private residence ?Living Arrangements: Spouse/significant other ?Available Help at Discharge: Family;Available 24 hours/day ?Type of Home: House ?Home Access: Stairs to enter ?Entrance Stairs-Rails: Right;Left ?Entrance Stairs-Number of Steps: 4 ?  ?Home Layout: One level ?Home Equipment: Conservation officer, nature (2 wheels) ?   ?  ?  Prior Function Prior Level of Function : Independent/Modified Independent ?  ?  ?  ?  ?  ?  ?Mobility Comments: Reports walking without AD at home. ?ADLs Comments: Independent with ADLs and IADLs ?  ? ? ?Hand Dominance  ?   ? ?  ?Extremity/Trunk Assessment  ? Upper Extremity Assessment ?Upper Extremity Assessment: Defer to OT evaluation ?  ? ?Lower  Extremity Assessment ?Lower Extremity Assessment: RLE deficits/detail ?RLE Deficits / Details: R foot wound at baseline with wound vac placed ?  ? ?Cervical / Trunk Assessment ?Cervical / Trunk Assessment: Normal  ?Communication  ? Communication: No difficulties  ?Cognition Arousal/Alertness: Awake/alert ?Behavior During Therapy: [facility] for tasks assessed/performed ?Overall Cognitive Status: Within Functional Limits for tasks assessed ?  ?  ?  ?  ?  ?  ?  ?  ?  ?  ?  ?  ?  ?  ?  ?  ?  ?  ?  ? ?  ?General Comments General comments (skin integrity, edema, etc.): Educated about importance of mobility during hospital stay to avoid deconditioning. ? ?  ?Exercises    ? ?Assessment/Plan  ?  ?PT Assessment Patient does not need any further PT services  ?PT Problem List   ? ?   ?  ?PT Treatment Interventions     ? ?PT Goals (Current goals can be found in the Care Plan section)  ?Acute Rehab PT Goals ?Patient Stated Goal: return home ?PT Goal Formulation: All assessment and education complete, DC therapy ?Time For Goal Achievement: 08/29/21 ?Potential to Achieve Goals: Good ? ?  ?Frequency   ?  ? ? ?Co-evaluation   ?  ?  ?  ?  ? ? ?  ?AM-PAC PT "6 Clicks" Mobility  ?Outcome Measure Help needed turning from your back to your side while in a flat bed without using bedrails?: None ?Help needed moving from lying on your back to sitting on the side of a flat bed without using bedrails?: None ?Help needed moving to and from a bed to a chair (including a wheelchair)?: None ?Help needed standing up from a chair using your arms (e.g., wheelchair or bedside chair)?: None ?Help needed to walk in hospital room?: None ?Help needed climbing 3-5 steps with a railing? : A Little ?6 Click Score: 23 ? ?  ?End of Session Equipment Utilized During Treatment: Other (comment) (post op shoe) ?Activity Tolerance: Patient tolerated treatment well ?Patient left: in bed;with call bell/phone within reach (on stretcher in ED) ?Nurse Communication: Mobility  status ?PT Visit Diagnosis: Other abnormalities of gait and mobility (R26.89) ?  ? ?Time: 6203-5597 ?PT Time Calculation (min) (ACUTE ONLY): 21 min ? ? ?Charges:   PT Evaluation ?$PT Eval Low Complexity: 1 Low ?  ?  ?   ? ? ?Reuel Derby, PT, DPT  ?Acute Rehabilitation Services  ?Pager: [phone] ?Office: [phone] ? ? ?[place] ?08/29/2021, 8:24 AM ?

## 2021-08-29 NOTE — Telephone Encounter (Signed)
Pts wife called about appt that is scheduled for today. Pt is currently admitted to the hospital so they will not make the appt today. What do they need to do about getting the dressing /wound vac changed? Should they have the hospitalist consults the on call provider or can we just send orders over to have it done? ?

## 2021-08-29 NOTE — Progress Notes (Signed)
? ? ? [facility] ?                                                                            ? ? ?Raymond [name], is a 61 y.o. male, DOB - 1960-10-04, VOZ:[id number] ?Admit date - 08/28/2021    ?Outpatient Primary MD for the patient is [name], FNP ? ?LOS - 0  days ? ? ? ?Brief summary  ? ?Patient is a 61 year old male with ESRD status post kidney transplant in 2016, not on HD now, recent history of diabetic foot wound with right wound VAC, managed by podiatry, diabetes mellitus type 2, IDDM, HTN, hyperlipidemia had presented for an outpatient arteriogram on the right lower extremity.  On preoperative labs was found to have significant hyperglycemia and hyperkalemia and procedure was canceled. ?Patient had been placed on Augmentin and Bactrim upon discharge on 3/26 as wound culture had shown strep agalactiae and staph lugdunensis.  ?He was seen in ED on 4/1 with hyperkalemia.  Nephrology was consulted at that time and thought that patient's hyperkalemia could have been due to Bactrim hence was changed to ciprofloxacin.  Potassium was 6.7, glucose 610 ?Patient was admitted for further work-up ? ?Assessment & Plan  ? ? ?Assessment and Plan: ?* Hyperkalemia ?-Unclear if it is antibiotic related, previously was on Bactrim, was changed to ciprofloxacin.  Patient was noted to have hyperkalemia on ED visit on 4/1 ?- on Augmentin and ciprofloxacin PTA, preoperative labs prior to angiogram showed K 6.7, was treated with Lokelma, insulin D50 ?-Nephrology consulted ? ?Diabetic foot wound ?-Status post I&D on 3/23 with podiatry, cultures had shown strep agalactiae and staph lugdunensis.  No evidence of osteomyelitis on MRI, previously on Augmentin and Bactrim (switched to Cipro on 4/1) ?-patient was placed on IV vancomycin and cefepime ?-ID recommending plan for linezolid and amoxicillin-clavulanate ?-Discussed with podiatry, Dr. Sherryle Lis, will change wound VAC today, recommended if we can get the angiogram  done ? ?PAD with ischemic changes to fourth right toe ?-Cardiology notified (Dr Fletcher Anon), will follow recommendations regarding plans for angiogram inpatient ? ?ESRD status post renal transplant in 2016 ?-Baseline creatinine 1.3-1.5, currently on tacrolimus ? ?Uncontrolled diabetes mellitus, IDDM, type II with complications, hyperglycemia, diabetic wounds, CKD ?-HbA1c was 10.4 on 08/14/2021, was briefly placed on insulin drip for CBG of 610 in ED ?-Diabetic coordinator consult  ?- continue moderate sliding scale insulin and Semglee 30 units twice daily ? ?History of anemia due to chronic kidney disease ?-Currently H&H stable, will monitor ? ?CKD (chronic kidney disease) stage 3b, GFR 30-59 ml/min (HCC) with history of renal transplant in 2016 ?- Sees Dr. Nelson Chimes at [facility] in [place] ?-Nephrology consulted, increase Prograf to 4 mg twice daily (was increased on 4/4 by his primary nephrologist) ? 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 ? ?Essential hypertension, benign ?-Continue clonidine, hydralazine  ? ?Obesity ?Estimated body mass index is 33.99 kg/m? as calculated from the following: ?  Height as of this encounter: '5\' 7"'$  (1.702 m). ?  Weight as of this encounter: 98.4 kg. ? ?Code Status: Full CODE STATUS ?DVT Prophylaxis:  heparin injection 5,000 Units Start:  08/28/21 2200 ?SCDs Start: 08/28/21 1841 ? ? ?Level of Care: Level of care: Telemetry Medical ?Family Communication:  ?Disposition Plan:     Remains inpatient appropriate: Continue current antibiotics, plan for possible angiogram while inpatient.  Podiatry to change wound VAC today. ? ?Procedures:  ? ? ?Consultants:   ?Podiatry ?Infectious disease ?Cardiology ? ?Antimicrobials:  ?Linezolid  08/29/21---> ?Augmentin ? ?Medications ? ? amoxicillin-clavulanate  1 tablet Oral Q12H  ? atorvastatin  10 mg Oral Daily  ? brimonidine  1 drop Both Eyes TID  ? [START ON  09/04/2021] cloNIDine  0.3 mg Transdermal Weekly  ? dorzolamide-timolol  1 drop Both Eyes BID  ? doxazosin  4 mg Oral Daily  ? heparin  5,000 Units Subcutaneous Q8H  ? hydrALAZINE  25 mg Oral BID  ? insulin aspart  0-15 Units Subcutaneous TID WC  ? insulin aspart  0-5 Units Subcutaneous QHS  ? insulin glargine-yfgn  30 Units Subcutaneous BID  ? latanoprost  1 drop Both Eyes QHS  ? linezolid  600 mg Oral Q12H  ? multivitamin with minerals  1 tablet Oral Daily  ? sodium bicarbonate  1,300 mg Oral TID  ? sodium zirconium cyclosilicate  10 g Oral BID  ? tacrolimus  4 mg Oral QPM  ? [START ON 08/30/2021] tacrolimus  4 mg Oral q AM  ? [START ON 09/23/2021] Vitamin D (Ergocalciferol)  50,000 Units Oral Q30 days  ? ? ? ?Subjective:  ? ?Raymond [name] was seen and examined today.  Somewhat frustrated with the issues including hyperkalemia, diabetic foot wound etc.  Pain controlled.  No acute nausea vomiting, diarrhea, chest pain or shortness of breath.  No fevers ? ?Objective:  ? ?Vitals:  ? 08/29/21 1130 08/29/21 1145 08/29/21 1200 08/29/21 1225  ?BP:   (!) 105/47 129/79  ?Pulse: 68 63 63 (!) 59  ?Resp:   16 18  ?Temp:   98 ?F (36.7 ?C) 98.4 ?F (36.9 ?C)  ?TempSrc:   Oral Oral  ?SpO2: 100% 99% 100% 100%  ?Weight:      ?Height:      ? ? ?Intake/Output Summary (Last 24 hours) at 08/29/2021 1420 ?Last data filed at 08/28/2021 1609 ?Gross per 24 hour  ?Intake 50 ml  ?Output --  ?Net 50 ml  ? ?Filed Weights  ? 08/28/21 1040  ?Weight: 98.4 kg  ? ? ? ?Exam ?General: Alert and oriented x 3, NAD ?Cardiovascular: S1 S2 auscultated, RRR ?Respiratory: Clear to auscultation bilaterally ?Gastrointestinal: Soft, nontender, nondistended, + bowel sounds ?Ext: right foot with wound VAC on ?Neuro: no new deficits ?Psych: Normal affect and demeanor, alert and oriented x3  ? ? ?Data Reviewed:  I have personally reviewed following labs  ? ? ?CBC ?Lab Results  ?Component Value Date  ? WBC 5.9 08/29/2021  ? RBC 3.73 (L) 08/29/2021  ? HGB 9.3 (L)  08/29/2021  ? HCT 31.3 (L) 08/29/2021  ? MCV 83.9 08/29/2021  ? MCH 24.9 (L) 08/29/2021  ? PLT 336 08/29/2021  ? MCHC 29.7 (L) 08/29/2021  ? RDW 14.8 08/29/2021  ? LYMPHSABS 1.5 08/29/2021  ? MONOABS 0.4 08/29/2021  ? EOSABS 0.2 08/29/2021  ? BASOSABS 0.0 08/29/2021  ? ? ? ?Last metabolic panel ?Lab Results  ?Component Value Date  ? NA 137 08/29/2021  ? K 5.0 08/29/2021  ? CL 108 08/29/2021  ? CO2 21 (L) 08/29/2021  ? BUN 27 (H) 08/29/2021  ? CREATININE 1.33 (H) 08/29/2021  ? GLUCOSE 203 (H) 08/29/2021  ? GFRNONAA >60  08/29/2021  ? GFRAA >60 01/20/2020  ? CALCIUM 9.2 08/29/2021  ? PHOS 3.5 08/29/2021  ? PROT 7.8 08/29/2021  ? ALBUMIN 2.1 (L) 08/29/2021  ? LABGLOB 3.6 02/28/2021  ? AGRATIO 1.1 (L) 02/28/2021  ? BILITOT 0.3 08/29/2021  ? ALKPHOS 90 08/29/2021  ? AST 13 (L) 08/29/2021  ? ALT 14 08/29/2021  ? ANIONGAP 8 08/29/2021  ? ? ?CBG (last 3)  ?Recent Labs  ?  08/29/21 ?0800 08/29/21 ?1207 08/29/21 ?1228  ?GLUCAP 187* 224* 242*  ?  ? ? ? ? ?Estill Cotta M.D. ?[facility] ?08/29/2021, 2:20 PM ? ?Available via Epic secure chat 7am-7pm ?After 7 pm, please refer to night coverage provider listed on amion. ? ?  ?

## 2021-08-29 NOTE — Progress Notes (Signed)
?[facility] ?Progress Note  ? ? ?Assessment/ Plan:   ? ? Hyperkalemia: improving with aggressive treatment.   ?- Continue lokelma BID until K around 4 ?- with DM and prograf- likely increased sensitivity to K load (RTV IV) ?- avoid all meds that can increase K ? ?2.  S/p renal transplant 2016 ?- increase prograf to 4 mg BID (his primary transplant nephrologist in [place] told him to do this yesterday) ?- appears he is only on prograf- checked in allscripts, he does not follow with [place], no other meds listed in Epic or in [facility] ? ?3.  Diabetic foot infection ?- ID following ?- on vanc/ cefepime ?- of note he has poor dentition--> ? If could be some of the problem for recurrent infections (has had L foot and L elbow infections too), defer to ID ? ?4.  HTN ?- on home meds ? ?5.  Dispo: OBS ? ?Subjective:   ? ?Pt is a 47M with ESRD s/p renal transplant 2016, DM, HTN, diabetic wound infection.  Was going for arteriogram yesterday and preop labs showed K of 6.7 and BS of 610.  He was aggressively treated with insulin, fluids, albuterol, calcium, bicarb, and lokelma.  K this AM is 5.0 and Cr is 1.3.  We are seeing him in this setting.   ? ?Pt reports his transplant nephrologist in [place] called him yesterday and asked him to increase prograf to 4 mg BID.  He had previously been on bactrim for treatment of foot wound.  Is not on K supps.    ? ?Objective:   ?BP (!) 170/92   Pulse 64   Temp 98.2 ?F (36.8 ?C) (Oral)   Resp 17   Ht '5\' 7"'$  (1.702 m)   Wt 98.4 kg   SpO2 100%   BMI 33.99 kg/m?  ? ?Intake/Output Summary (Last 24 hours) at 08/29/2021 1142 ?Last data filed at 08/28/2021 1609 ?Gross per 24 hour  ?Intake 50 ml  ?Output --  ?Net 50 ml  ? ?Weight change:  ? ?Physical Exam: ?Gen:NAD, sitting in bed ?HEENT: poor dentition, loose tooth visible when speaking ?CVS: RRR ?Resp: clear ?LKG:[id number] ?Ext: R foot with woundvac on ? ? ?Imaging: ?No results found. ? ?Labs: ?BMET ?Recent Labs  ?Lab  08/23/21 ?1056 08/24/21 ?0400 08/28/21 ?1210 08/28/21 ?1609 08/29/21 ?0656  ?NA 138 136 133* 138 137  ?K 6.1* 5.8* 6.7* 5.6* 5.0  ?CL 105 106 106 111 108  ?CO2 21 25 21* 21* 21*  ?GLUCOSE 206* 371* 610* 123* 203*  ?BUN 24 28* 27* 26* 27*  ?CREATININE 1.72* 1.79* 1.55* 1.39* 1.33*  ?CALCIUM 9.5 9.4 9.4 9.8 9.2  ?PHOS  --   --   --   --  3.5  ? ?CBC ?Recent Labs  ?Lab 08/24/21 ?0400 08/28/21 ?1609 08/29/21 ?0656  ?WBC 4.9 5.0 5.9  ?NEUTROABS 2.9 2.4 3.8  ?HGB 9.9* 9.6* 9.3*  ?HCT 34.4* 33.1* 31.3*  ?MCV 85.6 85.5 83.9  ?PLT 295 344 336  ? ? ?Medications:   ? ? atorvastatin  10 mg Oral Daily  ? brimonidine  1 drop Both Eyes TID  ? [START ON 09/04/2021] cloNIDine  0.3 mg Transdermal Weekly  ? dorzolamide-timolol  1 drop Both Eyes BID  ? doxazosin  4 mg Oral Daily  ? heparin  5,000 Units Subcutaneous Q8H  ? hydrALAZINE  25 mg Oral BID  ? insulin aspart  0-15 Units Subcutaneous TID WC  ? insulin aspart  0-5 Units Subcutaneous QHS  ?  insulin glargine-yfgn  30 Units Subcutaneous BID  ? latanoprost  1 drop Both Eyes QHS  ? multivitamin with minerals  1 tablet Oral Daily  ? sodium bicarbonate  1,300 mg Oral TID  ? sodium zirconium cyclosilicate  10 g Oral BID  ? tacrolimus  3 mg Oral q AM  ? tacrolimus  4 mg Oral QPM  ? [START ON 09/23/2021] Vitamin D (Ergocalciferol)  50,000 Units Oral Q30 days  ? ? ? ? ?Madelon Lips MD ?08/29/2021, 11:42 AM   ?

## 2021-08-29 NOTE — ED Notes (Signed)
MD at BS

## 2021-08-29 NOTE — Consult Note (Signed)
WOC consulted to replace NPWT to the surgical wound on the patient's foot, podiatry remove and replaced with betadine soaked gauze. Requested WOC to replace. Will plan to replace dressing 08/30/21; ordered NPWT device, 2 medium dressing kits, 1 VAC canister, and 4 barrier rings to aid in seal and lessen maceration.  ? ?[facility] team aware of need for visit tomorrow. ?[place], CNS, CWON-AP ?[phone]  ?

## 2021-08-30 ENCOUNTER — Other Ambulatory Visit (HOSPITAL_COMMUNITY): Payer: Self-pay

## 2021-08-30 DIAGNOSIS — E11621 Type 2 diabetes mellitus with foot ulcer: Secondary | ICD-10-CM

## 2021-08-30 LAB — CBC WITH DIFFERENTIAL/PLATELET
Abs Immature Granulocytes: 0.01 10*3/uL (ref 0.00–0.07)
Basophils Absolute: 0 10*3/uL (ref 0.0–0.1)
Basophils Relative: 1 %
Eosinophils Absolute: 0.1 10*3/uL (ref 0.0–0.5)
Eosinophils Relative: 2 %
HCT: 32.9 % — ABNORMAL LOW (ref 39.0–52.0)
Hemoglobin: 9.8 g/dL — ABNORMAL LOW (ref 13.0–17.0)
Immature Granulocytes: 0 %
Lymphocytes Relative: 29 %
Lymphs Abs: 1.4 10*3/uL (ref 0.7–4.0)
MCH: 24.7 pg — ABNORMAL LOW (ref 26.0–34.0)
MCHC: 29.8 g/dL — ABNORMAL LOW (ref 30.0–36.0)
MCV: 82.9 fL (ref 80.0–100.0)
Monocytes Absolute: 0.3 10*3/uL (ref 0.1–1.0)
Monocytes Relative: 7 %
Neutro Abs: 3.1 10*3/uL (ref 1.7–7.7)
Neutrophils Relative %: 61 %
Platelets: 359 10*3/uL (ref 150–400)
RBC: 3.97 MIL/uL — ABNORMAL LOW (ref 4.22–5.81)
RDW: 14.9 % (ref 11.5–15.5)
WBC: 5 10*3/uL (ref 4.0–10.5)
nRBC: 0 % (ref 0.0–0.2)

## 2021-08-30 LAB — RENAL FUNCTION PANEL
Albumin: 2.3 g/dL — ABNORMAL LOW (ref 3.5–5.0)
Anion gap: 8 (ref 5–15)
BUN: 30 mg/dL — ABNORMAL HIGH (ref 6–20)
CO2: 23 mmol/L (ref 22–32)
Calcium: 9.5 mg/dL (ref 8.9–10.3)
Chloride: 107 mmol/L (ref 98–111)
Creatinine, Ser: 1.47 mg/dL — ABNORMAL HIGH (ref 0.61–1.24)
GFR, Estimated: 54 mL/min — ABNORMAL LOW (ref >60)
Glucose, Bld: 238 mg/dL — ABNORMAL HIGH (ref 70–99)
Phosphorus: 3.1 mg/dL (ref 2.5–4.6)
Potassium: 4.8 mmol/L (ref 3.5–5.1)
Sodium: 138 mmol/L (ref 135–145)

## 2021-08-30 LAB — GLUCOSE, CAPILLARY: Glucose-Capillary: 254 mg/dL — ABNORMAL HIGH (ref 70–99)

## 2021-08-30 MED ORDER — NOVOLIN N FLEXPEN 100 UNIT/ML ~~LOC~~ SUPN: 60.0000 [IU] | PEN_INJECTOR | Freq: Two times a day (BID) | SUBCUTANEOUS | Status: DC

## 2021-08-30 MED ORDER — TACROLIMUS 1 MG PO CAPS: 4.0000 mg | ORAL_CAPSULE | Freq: Two times a day (BID) | ORAL | Status: DC

## 2021-08-30 MED ORDER — NOVOLIN R FLEXPEN 100 UNIT/ML IJ SOPN: 1.0000 [IU] | PEN_INJECTOR | Freq: Three times a day (TID) | INTRAMUSCULAR | 1 refills | Status: DC

## 2021-08-30 MED ORDER — SODIUM ZIRCONIUM CYCLOSILICATE 10 G PO PACK
10.0000 g | PACK | Freq: Every day | ORAL | 1 refills | Status: DC
  Filled 2021-08-30: qty 30, 30d supply, fill #0

## 2021-08-30 NOTE — Evaluation (Signed)
Occupational Therapy Evaluation ?Patient Details ?Name: Raymond [name] ?MRN: [id number] ?DOB: 03-Mar-1961 ?Today's Date: 08/30/2021 ? ? ?History of Present Illness Pt is a 61 y/o male admitted secondary to hyperkalemia and hyperglycemia. Was scheduled for arteriogram, but was unable secondary to labs. Pt with recent I and D of R foot ulcer with wound vac placement. PMH includes DM, HTN, ESRD s/p kidney transplant.  ? ?Clinical Impression ?  ? Pt currently modified independent for selfcare tasks and transfers using the cast shoe on the right foot.  He reports being able to get in and out of his shower as well as transferring without difficulty.  Educated on need for a shower seat to help keep weight off of the foot in standing as well as keeping water from getting into the wrapped right leg when covered for shower.  No further OT needs.  ?   ? ?Recommendations for follow up therapy are one component of a multi-disciplinary discharge planning process, led by the attending physician.  Recommendations may be updated based on patient status, additional functional criteria and insurance authorization.  ? ?Follow Up Recommendations ? No OT follow up  ?  ?Assistance Recommended at Discharge PRN  ?Patient can return home with the following Assist for transportation ? ?  ?Functional Status Assessment ? Patient has not had a recent decline in their functional status  ?Equipment Recommendations ? None recommended by OT  ?  ?   ?Precautions / Restrictions Precautions ?Precaution Comments: wound vac ?Restrictions ?Weight Bearing Restrictions: No ?RLE Weight Bearing: Partial weight bearing ?RLE Partial Weight Bearing Percentage or Pounds: heel only ?Other Position/Activity Restrictions: Per MD notes: PWB heel only - limited bed/bathroom  ? ?  ? ?Mobility Bed Mobility ?Overal bed mobility: Independent ?Bed Mobility: Supine to Sit ?  ?  ?Supine to sit: Independent ?Sit to supine: Independent ?  ?  ?  ? ?Transfers ?Overall transfer  level: Independent ?Equipment used: None ?Transfers: Sit to/from Stand ?Sit to Stand: Modified independent (Device/Increase time) ?  ?  ?  ?  ?  ?General transfer comment: Slight increased weightbearing through the entire right foot, but encouraged to keep the weight on the heel only per MD orders. ?  ? ?  ?Balance Overall balance assessment: No apparent balance deficits (not formally assessed) ?  ?Sitting balance-Leahy Scale: Normal ?  ?  ?Standing balance support: During functional activity ?Standing balance-Leahy Scale: Good ?Standing balance comment: Pt wears cast shoe on the right with encouragement to keep weightbearing off of the front of the foot. ?  ?  ?  ?  ?  ?  ?  ?  ?  ?  ?  ?   ? ?ADL either performed or assessed with clinical judgement  ? ?ADL Overall ADL's : Modified independent ?  ?  ?  ?  ?  ?  ?  ?  ?  ?  ?  ?  ?  ?  ?  ?  ?  ?  ?  ?General ADL Comments: Pt modified independent for selfcare tasks sit to stand.  Educated pt on use of a longer plastic bag in the shower as well as a seat to help decrease the likelyhood of water leaking into his bag and getting his foot wet.  ? ? ? ?Vision Baseline Vision/History: 1 Wears glasses ?Ability to See in Adequate Light: 0 Adequate ?Patient Visual Report: No change from baseline ?Vision Assessment?: No apparent visual deficits  ?   ?Perception Perception ?Perception: Within  Functional Limits ?  ?Praxis Praxis ?Praxis: Intact ?  ? ?Pertinent Vitals/Pain Pain Assessment ?Pain Assessment: No/denies pain  ? ? ? ?Hand Dominance Right ?  ?Extremity/Trunk Assessment Upper Extremity Assessment ?Upper Extremity Assessment: Overall WFL for tasks assessed ?  ?Lower Extremity Assessment ?Lower Extremity Assessment: Defer to PT evaluation ?  ?Cervical / Trunk Assessment ?Cervical / Trunk Assessment: Normal ?  ?Communication Communication ?Communication: No difficulties ?  ?Cognition Arousal/Alertness: Awake/alert ?Behavior During Therapy: [facility] for tasks  assessed/performed ?Overall Cognitive Status: Within Functional Limits for tasks assessed ?  ?  ?  ?  ?  ?  ?  ?  ?  ?  ?  ?  ?  ?  ?  ?  ?  ?  ?  ?   ?   ?   ? ? ?Home Living Family/patient expects to be discharged to:: Private residence ?Living Arrangements: Spouse/significant other ?Available Help at Discharge: Family;Available 24 hours/day ?Type of Home: House ?Home Access: Stairs to enter ?Entrance Stairs-Number of Steps: 4 ?Entrance Stairs-Rails: Right;Left ?Home Layout: One level ?  ?  ?Bathroom Shower/Tub: Tub/shower unit;Walk-in shower (has been sponge bathing because of foot wound) ?  ?Bathroom Toilet: Standard ?  ?  ?Home Equipment: Conservation officer, nature (2 wheels) ?  ?  ?  ? ?  ?Prior Functioning/Environment Prior Level of Function : Independent/Modified Independent ?  ?  ?  ?  ?  ?  ?Mobility Comments: Reports walking without AD at home. ?ADLs Comments: Independent with ADLs and IADLs ?  ? ?  ?  ?   ?   ?   ?   ?   ? ?   ?AM-PAC OT "6 Clicks" Daily Activity     ?Outcome Measure Help from another person eating meals?: None ?Help from another person taking care of personal grooming?: None ?Help from another person toileting, which includes using toliet, bedpan, or urinal?: None ?Help from another person bathing (including washing, rinsing, drying)?: None ?Help from another person to put on and taking off regular upper body clothing?: None ?Help from another person to put on and taking off regular lower body clothing?: None ?6 Click Score: 24 ?  ?End of Session Nurse Communication: Mobility status ? ?Activity Tolerance: Patient tolerated treatment well ?Patient left: in chair ? ?   ?              ?Time: 4098-1191 ?OT Time Calculation (min): 17 min ?Charges:  OT Evaluation ?$OT Eval Low Complexity: 1 Low ?Jaleesa Cervi OTR/L ?08/30/2021, 9:18 AM ?

## 2021-08-30 NOTE — TOC Progression Note (Signed)
Transition of Care (TOC) - Progression Note  ? ? ?Patient Details  ?Name: Raymond [name] ?MRN: [id number] ?Date of Birth: 06-12-1960 ? ?Transition of Care (TOC) CM/SW Contact  ?Angelita Ingles, RN ?Phone Number:[phone] ? ?08/30/2021, 10:30 AM ? ?Clinical Narrative:    ?MD sent message requesting CM to check co pay for insulins. CM has requested a benefits check per [facility] pharmacy. Currently CM does not see insurance listed for patient.  ? ? ?  ?  ? ?Expected Discharge Plan and Services ?  ?  ?  ?  ?  ?                ?  ?  ?  ?  ?  ?  ?  ?  ?  ?  ? ? ?Social Determinants of Health (SDOH) Interventions ?  ? ?Readmission Risk Interventions ? ?  08/15/2021  ?  1:24 PM  ?Readmission Risk Prevention Plan  ?Transportation Screening Complete  ?PCP or Specialist Appt within 5-7 Days Complete  ?Home Care Screening Complete  ? ? ?

## 2021-08-30 NOTE — Discharge Summary (Addendum)
?Physician Discharge Summary ?  ?Patient: Raymond [name] MRN: [id number] DOB: Oct 24, 1960  ?Admit date:     08/28/2021  ?Discharge date: 08/30/21  ?Discharge Physician: Leitha Hyppolite  ? ?PCP: [name], FNP  ? ?Recommendations at discharge:  ? ?Continue Lokelma 10 mg daily ?Needs to follow-up with his nephrologist for Prograf evels ? ?Discharge Diagnoses: ? ?  Hyperkalemia ?Diabetic foot wound on the right with wound VAC ?  GERD (gastroesophageal reflux disease) ?  Immunosuppression ([place]) ?  Type 2 diabetes mellitus ([place]) ?  Essential hypertension, benign ?  History of renal transplant ?  Proliferative diabetic retinopathy with macular edema associated with type 2 diabetes mellitus ([place]) ?  Diabetic foot ulcer ([place]) ?  CKD (chronic kidney disease) stage 3, GFR 30-59 ml/min (HCC) ?  Obesity (BMI 30-39.9) ?  History of anemia due to chronic kidney disease ? ?Hospital Course: ?Patient is a 61 year old male with ESRD status post kidney transplant in 2016, not on HD now, recent history of diabetic foot wound with right wound VAC, managed by podiatry, diabetes mellitus type 2, IDDM, HTN, hyperlipidemia had presented for an outpatient arteriogram on the right lower extremity.  On preoperative labs was found to have significant hyperglycemia and hyperkalemia and procedure was canceled. ?Patient had been placed on Augmentin and Bactrim upon discharge on 3/26 as wound culture had shown strep agalactiae and staph lugdunensis.  ?He was seen in ED on 4/1 with hyperkalemia.  Nephrology was consulted at that time and thought that patient's hyperkalemia could have been due to Bactrim hence was changed to ciprofloxacin.  Potassium was 6.7, glucose 610 ?Patient was admitted for further work-up ? ?Assessment and Plan: ?* ?Hyperkalemia ?-Unclear if it is antibiotic related, previously was on Bactrim, was changed to ciprofloxacin.  Patient was noted to have hyperkalemia on ED visit on 4/1 ?- on Augmentin and ciprofloxacin PTA,  preoperative labs prior to angiogram showed K 6.7, was treated with Lokelma, insulin D50 ?-Nephrology was consulted, potassium now 4.8, recommended to DC home with Lokelma 10 mg daily.  Patient will need follow-up labs by PCP or his nephrologist outpatient. ?  ?Diabetic foot wound ?-Status post I&D on 3/23 with podiatry, cultures had shown strep agalactiae and staph lugdunensis.  No evidence of osteomyelitis on MRI, previously on Augmentin and Bactrim (switched to Cipro on 4/1) ?-patient was placed on IV vancomycin and cefepime, then transitioned to linezolid and Augmentin. ?-Patient was seen by podiatry and infectious disease.  Wound VAC was changed today ?-Per infectious disease, Dr. Juleen China, no further need for antibiotics as patient has completed full 2 weeks.  He can follow-up with Dr. Megan Salon in [place] clinic. ?  ?PAD with ischemic changes to fourth right toe ?-Cardiology was notified.  Patient does not prefer to have the angiogram done now while he is inpatient.  I discussed with Dr. Gwenlyn Found, patient will be seen in the office in next 10 to 14 days and reschedule the procedure. ?-Patient was also counseled to have the labs done and closely monitor his diabetes prior to the procedure. ?  ?ESRD status post renal transplant in 2016 ?-Baseline creatinine 1.3-1.5, currently on tacrolimus ?-Creatinine 1.4 currently at baseline ?  ?Uncontrolled diabetes mellitus, IDDM, type II with complications, hyperglycemia, diabetic wounds, CKD ?-HbA1c was 10.4 on 08/14/2021, was briefly placed on insulin drip for CBG of 610 in ED, subsequently placed on sliding scale insulin and Semglee.  Patient had not taken the NPH insulin the night before and the morning of the procedure which  caused hyperglycemia ?-Patient will continue Trulicity and NPH 60 units twice daily.  Added sliding scale insulin (ReliOn Novolin) per his request. ?  ?History of anemia due to chronic kidney disease ?-Currently H&H stable, will monitor ?  ?CKD (chronic  kidney disease) stage 3b, GFR 30-59 ml/min (HCC) with history of renal transplant in 2016 ?- Sees Dr. Nelson Chimes at [facility] in [place] ?-Nephrology consulted, increase Prograf to 4 mg twice daily (was increased on 4/4 by his primary nephrologist).  Patient will need to have his labs done to follow-up on the levels ?  ?Proliferative diabetic retinopathy with macular edema associated with type 2 diabetes mellitus ([place]) ?- Continue with home eyedrops ?   ?Essential hypertension, benign ?-Continue clonidine, hydralazine  ?  ?Obesity ?Estimated body mass index is 33.99 kg/m? as calculated from the following: ?  Height as of this encounter: '5\' 7"'  (1.702 m). ?  Weight as of this encounter: 98.4 kg. ? ? ?Pain control - [employer] Controlled Substance Reporting System database was reviewed. and patient was instructed, not to drive, operate heavy machinery, perform activities at heights, swimming or participation in water activities or provide baby-sitting services while on Pain, Sleep and Anxiety Medications; until their outpatient Physician has advised to do so again. Also recommended to not to take more than prescribed Pain, Sleep and Anxiety Medications.  ?Consultants: Nephrology, cardiology, podiatry, ID ?Procedures performed: Wound VAC change ?Disposition: Home ?Diet recommendation:  ?Discharge Diet Orders (From admission, onward)  ? ?  Start     Ordered  ? 08/30/21 0000  Diet - low sodium heart healthy       ? 08/30/21 1046  ? ?  ?  ? ?  ? ?Carb modified diet ?DISCHARGE MEDICATION: ?Allergies as of 08/30/2021   ?No Known Allergies ?  ? ?  ?Medication List  ?  ? ?STOP taking these medications   ? ?amoxicillin-clavulanate 875-125 MG tablet ?Commonly known as: Augmentin ?  ?ciprofloxacin 500 MG tablet ?Commonly known as: Cipro ?  ? ?  ? ?TAKE these medications   ? ?Accu-Chek Guide Me w/Device Kit ?1 Piece by Does not apply route as directed. ?  ?atorvastatin 10 MG tablet ?Commonly known as: LIPITOR ?Take 10 mg by  mouth daily. ?  ?blood glucose meter kit and supplies Kit ?Dispense based on patient and insurance preference. Use up to four times daily as directed. (FOR ICD-9 250.00, 250.01). ?  ?brimonidine 0.2 % ophthalmic solution ?Commonly known as: ALPHAGAN ?Place 1 drop into both eyes 3 (three) times daily. ?  ?cloNIDine 0.3 mg/24hr patch ?Commonly known as: CATAPRES - Dosed in mg/24 hr ?Place 1 patch (0.3 mg total) onto the skin once a week. ?  ?dorzolamide-timolol 22.3-6.8 MG/ML ophthalmic solution ?Commonly known as: COSOPT ?Place 1 drop into both eyes 2 (two) times daily. ?  ?doxazosin 4 MG tablet ?Commonly known as: CARDURA ?Take 4 mg by mouth daily. ?  ?glucose blood test strip ?Commonly known as: Accu-Chek Guide ?Use as instructed 4 x daily. E11.65 ?  ?hydrALAZINE 25 MG tablet ?Commonly known as: APRESOLINE ?Take 25 mg by mouth 2 (two) times daily. ?  ?HYDROcodone-acetaminophen 5-325 MG tablet ?Commonly known as: NORCO/VICODIN ?Take 1 tablet by mouth every 4 (four) hours as needed for moderate pain. ?  ?latanoprost 0.005 % ophthalmic solution ?Commonly known as: XALATAN ?Place 1 drop into both eyes at bedtime. ?  ?Lokelma 10 g Pack packet ?Generic drug: sodium zirconium cyclosilicate ?Take 10 g by mouth daily. ?  ?multivitamin  capsule ?Take 1 capsule by mouth daily. ?  ?NovoLIN N FlexPen 100 UNIT/ML Kiwkpen ?Generic drug: Insulin NPH (Human) ([place]) ?Inject 60 Units into the skin in the morning and at bedtime. ?  ?NovoLIN R FlexPen ReliOn 100 UNIT/ML KwikPen ?Generic drug: Insulin Regular Human ?Inject 1-9 Units as directed 3 (three) times daily before meals. Sliding scale CBG 70 - 120: 0 units CBG 121 - 150: 1 unit,  CBG 151 - 200: 2 units,  CBG 201 - 250: 3 units,  CBG 251 - 300: 5 units,  CBG 301 - 350: 7 units,  CBG 351 - 400: 9 units   CBG > 400: 9 units and notify your doctor ?  ?sodium bicarbonate 650 MG tablet ?Take 1,300 mg by mouth 3 (three) times daily. ?  ?tacrolimus 1 MG capsule ?Commonly known as:  PROGRAF ?Take 4 capsules (4 mg total) by mouth 2 (two) times daily. ?What changed:  ?how much to take ?when to take this ?additional instructions ?  ?Trulicity 6.98 MY/0.7KL Sopn ?Generic drug: Dulaglutide ?Inject 0.

## 2021-08-30 NOTE — Progress Notes (Signed)
?   ? ?[facility] ? ?Date of Admission:  08/28/2021    ?       ?Reason for Consult: Diabetic foot infection ?                                     ?Referring Physician: Dr Tana Coast ?  ?Current antibiotics: ?Augmentin and Linezolid 4/6--present ? ?Previous antibiotics: ?Cefepime and Vancomycin 4/5 -- 4/6 ? ?ASSESSMENT:   ? ?61 y.o. male admitted with: ? ?Right diabetic foot wound: Status post I&D on 3/23 with podiatry.  Cultures grew group B strep and staph lugdunensis with infection tracking to the muscles and tendons.  Fortunately there was no evidence of osteomyelitis seen on MRI and patient was treated with oral antibiotics including Augmentin and Bactrim.  Bactrim was changed to Cipro on 3/30 due to hyperkalemia concerns related to Bactrim. ?Peripheral artery disease and ischemic changes to fourth right toe: Angiography which was planned for yesterday will be completed as an outpatient next week for potential endovascular therapy. ?ESRD status post deceased donor kidney transplant in 2016: Currently on tacrolimus for immunosuppression. ?Uncontrolled diabetes: Presenting with glucose greater than 600.  Most recent A1c last month was 10.4. ? ?RECOMMENDATIONS:   ? ?Patient has completed 2+ weeks of oral antibiotics following I&D with podiatry on 3/23.  Fortunately his MRI did not show any evidence of bone or joint involvement on 3/22.  Therefore, I think it is reasonable to stop antibiotics at this time as he has likely received adequate therapy for soft tissue infection due to mixed aerobic/anaerobic infection. ?Glycemic control ?Close wound care and podiatry follow-up ?Cardiology follow-up for possible vascular intervention regarding ischemic changes to right fourth toe although does not appear infected at this time ?Likely remains at high risk for future limb loss ?Can follow-up with Dr. Megan Salon in the [facility] as an outpatient if needed ?Patient concerned regarding recurrent infection and  feels like he has bacteria laying dormant in his body.  Discussed that this would not be a typical presentation for bacterial infection and suspect recurrent infections are most likely related to poor glycemic control ?Will sign off for now please call as needed ? ? ?Principal Problem: ?  Hyperkalemia ?Active Problems: ?  GERD (gastroesophageal reflux disease) ?  Immunosuppression ([place]) ?  Type 2 diabetes mellitus ([place]) ?  Essential hypertension, benign ?  History of renal transplant ?  Infected wound ?  Proliferative diabetic retinopathy with macular edema associated with type 2 diabetes mellitus ([place]) ?  Diabetic foot ulcer ([place]) ?  CKD (chronic kidney disease) stage 3, GFR 30-59 ml/min (HCC) ?  Obesity (BMI 30-39.9) ?  History of anemia due to chronic kidney disease ? ? ? ?MEDICATIONS:   ? ?Scheduled Meds: ?? amoxicillin-clavulanate  1 tablet Oral Q12H  ?? atorvastatin  10 mg Oral Daily  ?? brimonidine  1 drop Both Eyes TID  ?? [START ON 09/04/2021] cloNIDine  0.3 mg Transdermal Weekly  ?? dorzolamide-timolol  1 drop Both Eyes BID  ?? doxazosin  4 mg Oral Daily  ?? heparin  5,000 Units Subcutaneous Q8H  ?? hydrALAZINE  25 mg Oral BID  ?? insulin aspart  0-15 Units Subcutaneous TID WC  ?? insulin aspart  0-5 Units Subcutaneous QHS  ?? insulin glargine-yfgn  30 Units Subcutaneous BID  ?? latanoprost  1 drop Both Eyes QHS  ?? linezolid  600 mg Oral  Q12H  ?? multivitamin with minerals  1 tablet Oral Daily  ?? sodium bicarbonate  1,300 mg Oral TID  ?? sodium zirconium cyclosilicate  10 g Oral BID  ?? tacrolimus  4 mg Oral QPM  ?? tacrolimus  4 mg Oral q AM  ?? [START ON 09/23/2021] Vitamin D (Ergocalciferol)  50,000 Units Oral Q30 days  ? ?Continuous Infusions: ?PRN Meds:.acetaminophen **OR** acetaminophen, dextrose, hydrALAZINE, HYDROcodone-acetaminophen, ondansetron **OR** ondansetron (ZOFRAN) IV, polyethylene glycol ? ?SUBJECTIVE:  ? ?24 hour events:  ?No acute events ?Afebrile ?Glucose this morning 254 ? ?Patient  reports wanting to go home today.  Overall feeling well.  Has plans regarding better control of his glucose at home. ? ?Review of Systems  ?All other systems reviewed and are negative. ? ?  ?OBJECTIVE:  ? ?Blood pressure (!) 152/78, pulse 64, temperature 98 ?F (36.7 ?C), temperature source Oral, resp. rate 18, height '5\' 7"'$  (1.702 m), weight 103.7 kg, SpO2 98 %. ?Body mass index is 35.81 kg/m?. ? ?Physical Exam ?Constitutional:   ?   General: He is not in acute distress. ?   Appearance: Normal appearance.  ?HENT:  ?   Head: Normocephalic and atraumatic.  ?Eyes:  ?   Extraocular Movements: Extraocular movements intact.  ?   Conjunctiva/sclera: Conjunctivae normal.  ?Pulmonary:  ?   Effort: Pulmonary effort is normal. No respiratory distress.  ?Abdominal:  ?   Palpations: Abdomen is soft.  ?   Tenderness: There is no abdominal tenderness.  ?Musculoskeletal:  ?   Comments: Right foot is in an Ace bandage with wound VAC in place and Darco boot on.  ?Skin: ?   General: Skin is warm and dry.  ?   Findings: No rash.  ?Neurological:  ?   General: No focal deficit present.  ?   Mental Status: He is alert and oriented to person, place, and time.  ?Psychiatric:     ?   Mood and Affect: Mood normal.     ?   Behavior: Behavior normal.  ? ? ? ?Lab Results: ?Lab Results  ?Component Value Date  ? WBC 5.9 08/29/2021  ? HGB 9.3 (L) 08/29/2021  ? HCT 31.3 (L) 08/29/2021  ? MCV 83.9 08/29/2021  ? PLT 336 08/29/2021  ?  ?Lab Results  ?Component Value Date  ? NA 137 08/29/2021  ? K 5.0 08/29/2021  ? CO2 21 (L) 08/29/2021  ? GLUCOSE 203 (H) 08/29/2021  ? BUN 27 (H) 08/29/2021  ? CREATININE 1.33 (H) 08/29/2021  ? CALCIUM 9.2 08/29/2021  ? GFRNONAA >60 08/29/2021  ? GFRAA >60 01/20/2020  ?  ?Lab Results  ?Component Value Date  ? ALT 14 08/29/2021  ? AST 13 (L) 08/29/2021  ? ALKPHOS 90 08/29/2021  ? BILITOT 0.3 08/29/2021  ? ? ?   ?Component Value Date/Time  ? CRP 25.2 (H) 08/14/2021 1842  ? ? ?   ?Component Value Date/Time  ? ESRSEDRATE 88  (H) 08/14/2021 1842  ? ?  ?I have reviewed the micro and lab results in Epic. ? ?Imaging: ?No results found.  ? ?Imaging independently reviewed in Epic.  ? ? ?Mignon Pine ?[facility] ?[place] ?[phone] pager ?08/30/2021, 10:05 AM ? ?I have personally spent 50 minutes involved in face-to-face and non-face-to-face activities for this patient on the day of the visit. Professional time spent includes the following activities: Preparing to see the patient (review of tests), Obtaining and/or reviewing separately obtained history (admission/discharge record), Performing a  medically appropriate examination and/or evaluation , Ordering medications/tests/procedures, referring and communicating with other health care professionals, Documenting clinical information in the EMR, Independently interpreting results (not separately reported), Communicating results to the patient/family/caregiver, Counseling and educating the patient/family/caregiver and Care coordination (not separately reported).  ? ? ?

## 2021-08-30 NOTE — Consult Note (Signed)
WOC Nurse Consult Note: ?Reason for Consult:NPWT dressing placement to chronic right foot wound on plantar aspect per request of Dr. Thomasene Lot, DPM. Dr Tana Coast is present during my dressing change. ?Wound type: chronic, full thickness ?Pressure Injury POA: N/A ?Measurement: 9cm x 6cm x 0.8cm ?Wound bed:red, moist ?Drainage (amount, consistency, odor) moderate serous ?Periwound: maceration extending to 1cm circumferentially ?Dressing procedure/placement/frequency: Dressings removed, wound cleansed with NS. Skin barrier ring placed along periphery to maintain seal and confine drainage to wound. One piece of black foam applied to wound bed. Secured with drape. Drape applied to lower LE. Bridge applied over drape and secured. Dressing connected to 138mHg continuous negative pressure using patient's NPWT device from home as discharge is expected this morning. An immediate seal is achieved. Patient tolerated dressing change well.  ?Right great toe and 1-2 digits are dressed with dry gauze dressing and foot is padded with two (2) ABD pads. These are secured with Kerlix roll gauze/paper tape and covered with ACE bandage. The tubing from the NPWT dressing is padded to avoid an MDRPI.  ? ?Supplies delivered to room last evening in anticipation of a longer LOS are returned as unused. ? ?Patient to follow up with podiatry for dressing change on Thursday of this week. ? ?[facility] team will not follow, but will remain available to this patient, the nursing and medical teams.  Please re-consult if needed. ?Thanks, ?LMaudie Flakes MSN, RN, GChalfant CRoy CWON-AP, FBerks ?Pager# [phone] ? ? ? ? ?  ?

## 2021-08-30 NOTE — Plan of Care (Signed)
?  Problem: Activity: ?Goal: Risk for activity intolerance will decrease ?Outcome: Progressing ?  ?Problem: Coping: ?Goal: Level of anxiety will decrease ?Outcome: Progressing ?  ?Problem: Elimination: ?Goal: Will not experience complications related to bowel motility ?Outcome: Progressing ?Goal: Will not experience complications related to urinary retention ?Outcome: Progressing ?  ?Problem: Pain Managment: ?Goal: General experience of comfort will improve ?Outcome: Progressing ?  ?Problem: Education: ?Goal: Knowledge of General Education information will improve ?Description: Including pain rating scale, medication(s)/side effects and non-pharmacologic comfort measures ?Outcome: Progressing ?  ?Problem: Health Behavior/Discharge Planning: ?Goal: Ability to manage health-related needs will improve ?Outcome: Progressing ?  ?Problem: Clinical Measurements: ?Goal: Ability to maintain clinical measurements within normal limits will improve ?Outcome: Progressing ?Goal: Will remain free from infection ?Outcome: Progressing ?Goal: Diagnostic test results will improve ?Outcome: Progressing ?Goal: Respiratory complications will improve ?Outcome: Progressing ?Goal: Cardiovascular complication will be avoided ?Outcome: Progressing ?  ?Problem: Activity: ?Goal: Risk for activity intolerance will decrease ?Outcome: Progressing ?  ?Problem: Nutrition: ?Goal: Adequate nutrition will be maintained ?Outcome: Progressing ?  ?Problem: Elimination: ?Goal: Will not experience complications related to bowel motility ?Outcome: Progressing ?Goal: Will not experience complications related to urinary retention ?Outcome: Progressing ?  ?Problem: Pain Managment: ?Goal: General experience of comfort will improve ?Outcome: Progressing ?  ?Problem: Safety: ?Goal: Ability to remain free from injury will improve ?Outcome: Progressing ?  ?Problem: Skin Integrity: ?Goal: Risk for impaired skin integrity will decrease ?Outcome: Progressing ?  ?

## 2021-09-03 ENCOUNTER — Telehealth: Payer: Self-pay | Admitting: Cardiovascular Disease

## 2021-09-03 DIAGNOSIS — E875 Hyperkalemia: Secondary | ICD-10-CM

## 2021-09-03 NOTE — Telephone Encounter (Signed)
Patient states he is supposed to have a procedure 4/17 with Dr. Gwenlyn Found.  ?

## 2021-09-03 NOTE — Telephone Encounter (Signed)
Spoke with patient of Dr. Gwenlyn Found  ?He was scheduled for PV angiogram on 4/5 but it was cancelled ?He needs a new appointment ?He still has 4/17 f/u doppler on the schedule ?Advised will send to [place], RN to review/call to schedule when she is back in the office this week ?

## 2021-09-04 ENCOUNTER — Telehealth: Payer: Self-pay | Admitting: Cardiovascular Disease

## 2021-09-04 NOTE — Telephone Encounter (Signed)
09/04/21  ? ?Long Term Disability - [place] forms put in [employer].  ?

## 2021-09-05 ENCOUNTER — Ambulatory Visit (INDEPENDENT_AMBULATORY_CARE_PROVIDER_SITE_OTHER): Payer: Self-pay | Admitting: Sports Medicine

## 2021-09-05 DIAGNOSIS — L97512 Non-pressure chronic ulcer of other part of right foot with fat layer exposed: Secondary | ICD-10-CM

## 2021-09-05 DIAGNOSIS — E0843 Diabetes mellitus due to underlying condition with diabetic autonomic (poly)neuropathy: Secondary | ICD-10-CM

## 2021-09-05 DIAGNOSIS — I739 Peripheral vascular disease, unspecified: Secondary | ICD-10-CM

## 2021-09-05 DIAGNOSIS — I998 Other disorder of circulatory system: Secondary | ICD-10-CM

## 2021-09-05 MED ORDER — MUPIROCIN 2 % EX OINT: 1.0000 "application " | TOPICAL_OINTMENT | Freq: Two times a day (BID) | CUTANEOUS | 0 refills | Status: DC

## 2021-09-05 NOTE — Progress Notes (Signed)
Subjective: ?Raymond [name] is a 61 y.o. male patient seen today in office for POV #3 in office status post right foot incision and drainage plantar wound performed on 08/15/21 and later bedside lancing of blisters that developed subsequently a few days later after original procedure at the right fourth toe.  Patient reports that he was admitted to the hospital and they did not do anything for the 2 days that he was there states that he was admitted because his potassium was elevated which may have been from the Bactrim antibiotic and states that they gave him medicine to lower his potassium but did not put him back on any additional antibiotics at this time.  Patient reports that he has an appointment on the 17th with vascular.  Patient denies any other problems except his wound VAC malfunctioning and not working since 12 PM today. ? ?Patient is diabetic blood sugar not recorded at this visit. ? ?Patient is assisted by wife this visit. ? ?Patient Active Problem List  ? Diagnosis Date Noted  ? Hyperkalemia 08/28/2021  ? Obesity (BMI 30-39.9) 08/28/2021  ? History of anemia due to chronic kidney disease 08/28/2021  ? Diabetic foot ulcer ([place]) 08/14/2021  ? CKD (chronic kidney disease) stage 3, GFR 30-59 ml/min (HCC) 08/14/2021  ? Hypomagnesemia 06/25/2021  ? Septic arthritis of elbow, left ([place]) 06/24/2021  ? Peripheral vascular disease ([place]) 04/24/2020  ? Pyogenic inflammation of bone (HCC)   ? Thrombophlebitis of superficial veins of right lower extremity   ? Difficult intravenous access   ? MRSA bacteremia   ? Diabetic foot infection ([place])   ? Infected wound 01/12/2020  ? Critical lower limb ischemia ([place]) 02/02/2019  ? Personal history of noncompliance with medical treatment, presenting hazards to health 10/07/2018  ? Critical limb ischemia with history of revascularization of same extremity ([place]) 07/27/2018  ? Mixed hyperlipidemia 02/19/2018  ? History of renal transplant 02/19/2018  ? Conductive hearing  loss, bilateral 08/31/2017  ? Nuclear sclerotic cataract of right eye 10/07/2016  ? Pseudophakia of left eye 10/07/2016  ? Hypertension due to endocrine disorder 03/04/2016  ? 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 Renal failure 03/04/2016  ? Proliferative diabetic retinopathy with macular edema associated with type 2 diabetes mellitus ([place]) 03/04/2016  ? Immunosuppression ([place]) 12/10/2015  ? Nausea vomiting and diarrhea 12/10/2015  ? Type 2 diabetes mellitus ([place]) 12/10/2015  ? Essential hypertension, benign 12/10/2015  ? Pancytopenia ([place]) 12/10/2015  ? Encounter for screening colonoscopy 11/15/2014  ? GERD (gastroesophageal reflux disease) 11/15/2014  ? ? ?Current Outpatient Medications on File Prior to Visit  ?Medication Sig Dispense Refill  ? atorvastatin (LIPITOR) 10 MG tablet Take 10 mg by mouth daily.    ? blood glucose meter kit and supplies KIT Dispense based on patient and insurance preference. Use up to four times daily as directed. (FOR ICD-9 250.00, 250.01). 1 each 0  ? Blood Glucose Monitoring Suppl (ACCU-CHEK GUIDE ME) w/Device KIT 1 Piece by Does not apply route as directed. 1 kit 0  ? brimonidine (ALPHAGAN) 0.2 % ophthalmic solution Place 1 drop into both eyes 3 (three) times daily.    ? cloNIDine (CATAPRES - DOSED IN MG/24 HR) 0.3 mg/24hr patch Place 1 patch (0.3 mg total) onto the skin once a week. 4 patch 12  ? dorzolamide-timolol (COSOPT) 22.3-6.8 MG/ML ophthalmic solution Place 1 drop into both eyes 2 (two) times daily.    ?  doxazosin (CARDURA) 4 MG tablet Take 4 mg by mouth daily.    ? Dulaglutide (TRULICITY) 0.75 MG/0.5ML SOPN Inject 0.75 mg into the skin once a week. (Patient taking differently: Inject 0.75 mg into the skin every Sunday.) 6 mL 3  ? glucose blood (ACCU-CHEK GUIDE) test strip Use as instructed 4 x daily. E11.65 150 each 5  ? hydrALAZINE (APRESOLINE) 25 MG tablet Take 25 mg by mouth 2  (two) times daily.    ? HYDROcodone-acetaminophen (NORCO/VICODIN) 5-325 MG tablet Take 1 tablet by mouth every 4 (four) hours as needed for moderate pain. 20 tablet 0  ? Insulin NPH, Human,, Isophane, (NOVOLIN N FLEXPEN) 100 UNIT/ML Kiwkpen Inject 60 Units into the skin in the morning and at bedtime.    ? Insulin Regular Human (NOVOLIN R FLEXPEN RELION) 100 UNIT/ML KwikPen Inject 1-9 Units as directed 3 (three) times daily before meals. Sliding scale CBG 70 - 120: 0 units CBG 121 - 150: 1 unit,  CBG 151 - 200: 2 units,  CBG 201 - 250: 3 units,  CBG 251 - 300: 5 units,  CBG 301 - 350: 7 units,  CBG 351 - 400: 9 units   CBG > 400: 9 units and notify your doctor 15 mL 1  ? latanoprost (XALATAN) 0.005 % ophthalmic solution Place 1 drop into both eyes at bedtime.    ? Multiple Vitamin (MULTIVITAMIN) capsule Take 1 capsule by mouth daily.    ? sodium bicarbonate 650 MG tablet Take 1,300 mg by mouth 3 (three) times daily.    ? sodium zirconium cyclosilicate (LOKELMA) 10 g PACK packet Take 10 g by mouth daily. 30 packet 1  ? tacrolimus (PROGRAF) 1 MG capsule Take 4 capsules (4 mg total) by mouth 2 (two) times daily.    ? Vitamin D, Ergocalciferol, (DRISDOL) 50000 units CAPS capsule Take 50,000 Units by mouth every 30 (thirty) days.    ? ?No current facility-administered medications on file prior to visit.  ? ? ?No Known Allergies ? ?Objective: ?There were no vitals filed for this visit. ? ?General: No acute distress, AAOx3  ?Right foot: There is a full-thickness granular wound to the plantar aspect of the right foot that measures doubled in size in the width now measures 7-1/2 x 8 cm with granular base.  There is very severe maceration noted to the wound area since the wound VAC has not been working, minimal bloody drainage, no malodor, right fourth toe has a deroofed blister and all of the previous eschar skin has peeled off and that there is only a small eschar noted now at the distal tuft of the right third toe with  significant interdigital maceration.  There is localized swelling without pain or warmth to the entire right lower extremity.  DP and PT pedal pulses are diminished.  No pain to palpation to lower leg or calf. ? ?Assessment and Plan:  ?Problem List Items Addressed This Visit   ?None ?Visit Diagnoses   ? ? Foot ulcer, right, with fat layer exposed (HCC)    -  Primary  ? Ischemia of toe      ? PAD (peripheral artery disease) (HCC)      ? Diabetes mellitus due to underlying condition with diabetic autonomic neuropathy, unspecified whether long term insulin use (HCC)      ? ?  ? ? ? ?-Patient seen and evaluated ?-Discontinue wound VAC for now since it is making the wound worse with severe maceration ?-Applied Betadine periwound and in   between all toes and applied antibiotic ointment to the central plantar wound which is granular covered with dry dressing.  Encouraged wife to change dressings daily ?-At this time I did not give patient an additional antibiotic by mouth since previous antibiotics have affected potassium level ?-Advised patient that he should get in with his PCP to see if they can continue to monitor his potassium level posthospital discharge ?-Advised patient to continue with rest and elevation for pain and edema control and Ace wrap to the level of the knee to help with swelling advised patient if there is pain and increased swelling to the foot or lower leg to return to office sooner or to go to ER ?-Continue with weightbearing only to heel with surgical shoe  ?-Continue with close vascular follow-up he is scheduled for 09/09/2021 with Dr. Berry ?-Patient to return as scheduled on next week for wound assessment or sooner problems or issues arise.   ? , DPM ? ?

## 2021-09-06 NOTE — Telephone Encounter (Signed)
Spoke with pt regarding follow up labs and office visit. Pt will come on Monday for BMET and office visit scheduled for Tues, April 18th at [facility]. Lab orders placed. ?

## 2021-09-06 NOTE — Telephone Encounter (Signed)
Left message for pt to call back  °

## 2021-09-09 ENCOUNTER — Ambulatory Visit (HOSPITAL_COMMUNITY): Payer: Self-pay

## 2021-09-09 LAB — BASIC METABOLIC PANEL
BUN/Creatinine Ratio: 21 (ref 10–24)
BUN: 22 mg/dL (ref 8–27)
CO2: 24 mmol/L (ref 20–29)
Calcium: 9.2 mg/dL (ref 8.6–10.2)
Chloride: 102 mmol/L (ref 96–106)
Creatinine, Ser: 1.03 mg/dL (ref 0.76–1.27)
Glucose: 295 mg/dL — ABNORMAL HIGH (ref 70–99)
Potassium: 5.2 mmol/L (ref 3.5–5.2)
Sodium: 138 mmol/L (ref 134–144)
eGFR: 83 mL/min/{1.73_m2} (ref >59)

## 2021-09-10 ENCOUNTER — Ambulatory Visit (INDEPENDENT_AMBULATORY_CARE_PROVIDER_SITE_OTHER): Payer: Self-pay | Admitting: Cardiovascular Disease

## 2021-09-10 ENCOUNTER — Encounter: Payer: Self-pay | Admitting: Cardiovascular Disease

## 2021-09-10 ENCOUNTER — Other Ambulatory Visit: Payer: Self-pay

## 2021-09-10 DIAGNOSIS — I70229 Atherosclerosis of native arteries of extremities with rest pain, unspecified extremity: Secondary | ICD-10-CM

## 2021-09-10 DIAGNOSIS — Z9889 Other specified postprocedural states: Secondary | ICD-10-CM

## 2021-09-10 MED ORDER — SODIUM CHLORIDE 0.9% FLUSH: 3.0000 mL | Freq: Two times a day (BID) | INTRAVENOUS | Status: DC

## 2021-09-10 NOTE — H&P (View-Only) (Signed)
? ? ? ?09/10/2021 ?Raymond [name]   ?09-05-1960  ?[id number] ? ?Primary Physician [name], [place] ?Primary Cardiologist: Lorretta Harp MD Garret Reddish, [place], [place] ? ?HPI:  Raymond [name] is a 61 y.o.  moderately overweight separated African-American male father of 2 children, grandfather of 2 grandchildren who I last saw in the office 08/21/2021.[name]  He was was referred by Dr. Jacqualyn Posey, his podiatrist for peripheral vascular valuation because of a small ulcer on the plantar surface of his right first metatarsal.  He has a history of treated hypertension, diabetes and hyperlipidemia.  His father had an MI at age 56 and his brother recently had a myocardial infarction as well.  He is never had a heart attack or stroke.  He denies chest pain or shortness of breath.  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  He apparently stepped on a nail approximately 2 to 3 months ago and developed a wound on the plantar surface of his right first metatarsal which Dr. Jacqualyn Posey has been treating fairly frequently over the last several months.  Apparently this is slowly healing.  He had Dopplers performed in [place] recently that were told showed normal circulation.  Since I saw him 6 months ago the wound on his right foot is healed.  He is developed a new wound on his left foot with recent Dopplers that showed a left TBI 0.28, monophasic waveforms in his left SFA with tibial vessel disease, occluded anterior tibial and peroneal artery.  I suspect he will need a amputation of that toe since he has documented osteomyelitis according to Dr. Jacqualyn Posey.  Is unclear whether this will heal but I suspect based on his Dopplers that this will be difficult.  I am also concerned that angiography will lead to radiocontrast nephropathy and jeopardize his transplanted kidney. ?  ?He saw Dr. Jacqualyn Posey  who felt that his  wound was unlikely to heal and that he would require amputation.  Dr. Jacqualyn Posey spoke to the patient's nephrologist echoing my concern about exposure to radiocontrast in the setting of moderate renal insufficiency status post renal artery transplant and apparently the nephrologist felt comfortable proceeding with peripheral angiography potential intervention. ?  ?When I saw him in the office 08/21/2021 his right heel wound was poorly healing.  He has 0 vessel runoff below the knee by duplex ultrasound performed 04/02/2021.  I will arrange for him to undergo peripheral angiography on 08/28/2020 however this was canceled because of metabolic abnormalities of increased serum creatinine, increase serum glucose and potassium which has since resolved.  I am going to reschedule him tomorrow with Dr. Fletcher Anon  for peripheral angiography and potential intervention for limb salvage. ? ?Current Meds  ?Medication Sig  ? atorvastatin (LIPITOR) 10 MG tablet Take 10 mg by mouth daily.  ? blood glucose meter kit and supplies KIT Dispense based on patient and insurance preference. Use up to four times daily as directed. (FOR ICD-9 250.00, 250.01).  ? Blood Glucose Monitoring Suppl (ACCU-CHEK GUIDE ME) w/Device KIT 1 Piece by Does not apply route as directed.  ? brimonidine (ALPHAGAN) 0.2 % ophthalmic solution Place 1 drop into both eyes 3 (three) times daily.  ? cloNIDine (CATAPRES - DOSED IN MG/24 HR) 0.3 mg/24hr patch Place 1 patch (0.3 mg total) onto the skin once a week.  ? dorzolamide-timolol (COSOPT) 22.3-6.8 MG/ML ophthalmic solution Place 1 drop into  both eyes 2 (two) times daily.  ? doxazosin (CARDURA) 4 MG tablet Take 4 mg by mouth daily.  ? Dulaglutide (TRULICITY) 0.01 VC/9.4WH SOPN Inject 0.75 mg into the skin once a week. (Patient taking differently: Inject 0.75 mg into the skin every Sunday.)  ? glucose blood (ACCU-CHEK GUIDE) test strip Use as instructed 4 x daily. E11.65  ? hydrALAZINE (APRESOLINE) 25 MG tablet Take 25 mg by  mouth 2 (two) times daily.  ? HYDROcodone-acetaminophen (NORCO/VICODIN) 5-325 MG tablet Take 1 tablet by mouth every 4 (four) hours as needed for moderate pain.  ? Insulin NPH, Human,, Isophane, (NOVOLIN N FLEXPEN) 100 UNIT/ML Kiwkpen Inject 60 Units into the skin in the morning and at bedtime.  ? Insulin Regular Human (NOVOLIN R FLEXPEN RELION) 100 UNIT/ML KwikPen Inject 1-9 Units as directed 3 (three) times daily before meals. Sliding scale CBG 70 - 120: 0 units CBG 121 - 150: 1 unit,  CBG 151 - 200: 2 units,  CBG 201 - 250: 3 units,  CBG 251 - 300: 5 units,  CBG 301 - 350: 7 units,  CBG 351 - 400: 9 units   CBG > 400: 9 units and notify your doctor  ? latanoprost (XALATAN) 0.005 % ophthalmic solution Place 1 drop into both eyes at bedtime.  ? Multiple Vitamin (MULTIVITAMIN) capsule Take 1 capsule by mouth daily.  ? mupirocin ointment (BACTROBAN) 2 % Apply 1 application. topically 2 (two) times daily. For wound care  ? sodium bicarbonate 650 MG tablet Take 1,300 mg by mouth 3 (three) times daily.  ? sodium zirconium cyclosilicate (LOKELMA) 10 g PACK packet Take 10 g by mouth daily.  ? tacrolimus (PROGRAF) 1 MG capsule Take 4 capsules (4 mg total) by mouth 2 (two) times daily.  ? Vitamin D, Ergocalciferol, (DRISDOL) 50000 units CAPS capsule Take 50,000 Units by mouth every 30 (thirty) days.  ?  ? ?No Known Allergies ? ?Social History  ? ?Socioeconomic History  ? Marital status: Married  ?  Spouse name: Not on file  ? Number of children: Not on file  ? Years of education: Not on file  ? Highest education level: Not on file  ?Occupational History  ? Not on file  ?Tobacco Use  ? Smoking status: Never  ? Smokeless tobacco: Never  ?Vaping Use  ? Vaping Use: Never used  ?Substance and Sexual Activity  ? Alcohol use: No  ?  Alcohol/week: 0.0 standard drinks  ? Drug use: No  ? Sexual activity: Not on file  ?Other Topics Concern  ? Not on file  ?Social History Narrative  ? ** Merged History Encounter **  ?    ? ?Social  Determinants of Health  ? ?Financial Resource Strain: Not on file  ?Food Insecurity: Not on file  ?Transportation Needs: Not on file  ?Physical Activity: Not on file  ?Stress: Not on file  ?Social Connections: Not on file  ?Intimate Partner Violence: Not on file  ?  ? ?Review of Systems: ?General: negative for chills, fever, night sweats or weight changes.  ?Cardiovascular: negative for chest pain, dyspnea on exertion, edema, orthopnea, palpitations, paroxysmal nocturnal dyspnea or shortness of breath ?Dermatological: negative for rash ?Respiratory: negative for cough or wheezing ?Urologic: negative for hematuria ?Abdominal: negative for nausea, vomiting, diarrhea, bright red blood per rectum, melena, or hematemesis ?Neurologic: negative for visual changes, syncope, or dizziness ?All other systems reviewed and are otherwise negative except as noted above. ? ? ? ?Blood pressure 140/62, pulse 78, height  '5\' 7"'  (1.702 m), weight 222 lb 3.2 oz (100.8 kg), SpO2 99 %.  ?General appearance: alert and no distress ?Neck: no adenopathy, no carotid bruit, no JVD, supple, symmetrical, trachea midline, and thyroid not enlarged, symmetric, no tenderness/mass/nodules ?Lungs: clear to auscultation bilaterally ?Heart: regular rate and rhythm, S1, S2 normal, no murmur, click, rub or gallop ?Extremities: extremities normal, atraumatic, no cyanosis or edema ?Pulses: 2+ and symmetric ?Skin: Skin color, texture, turgor normal. No rashes or lesions ?Neurologic: Grossly normal ? ?EKG not performed today ? ?ASSESSMENT AND PLAN:  ? ?Critical limb ischemia with history of revascularization of same extremity ?Mr. Retherford returns today for follow-up.  He was scheduled to have peripheral angiography by Dr. Fletcher Anon several weeks ago however he was found to have renal insufficiency and hyperkalemia prior to the procedure which was canceled.  He was admitted to the hospital on 08/28/2021 and discharged 2 days later.  His oral antibiotics were  discontinued.  His metabolic abnormalities have resolved.  I am going to schedule him for angiography tomorrow. ? ? ? ? ?Lorretta Harp MD FACP,FACC,FAHA, FSCAI ?09/10/2021 ?9:10 AM ?

## 2021-09-10 NOTE — Assessment & Plan Note (Signed)
Mr. Blodgett returns today for follow-up.  He was scheduled to have peripheral angiography by Dr. Fletcher Anon several weeks ago however he was found to have renal insufficiency and hyperkalemia prior to the procedure which was canceled.  He was admitted to the hospital on 08/28/2021 and discharged 2 days later.  His oral antibiotics were discontinued.  His metabolic abnormalities have resolved.  I am going to schedule him for angiography tomorrow. ?

## 2021-09-10 NOTE — Patient Instructions (Signed)
Medication Instructions:  ?Your physician recommends that you continue on your current medications as directed. Please refer to the Current Medication list given to you today. ? ?*If you need a refill on your cardiac medications before your next appointment, please call your pharmacy* ? ? ?Testing/Procedures: ?See below ? ? ?Follow-Up: ?At [facility], you and your health needs are our priority.  As part of our continuing mission to provide you with exceptional heart care, we have created designated Provider Care Teams.  These Care Teams include your primary Cardiologist (physician) and Advanced Practice Providers (APPs -  Physician Assistants and Nurse Practitioners) who all work together to provide you with the care you need, when you need it. ? ?We recommend signing up for the patient portal called "MyChart".  Sign up information is provided on this After Visit Summary.  MyChart is used to connect with patients for Virtual Visits (Telemedicine).  Patients are able to view lab/test results, encounter notes, upcoming appointments, etc.  Non-urgent messages can be sent to your provider as well.   ?To learn more about what you can do with MyChart, go to [email or web address].   ? ?Your next appointment:   ?Friday, April 28th at 11:30am ? ?Provider:   ?Quay Burow, MD  ?  ? ? ?Other Instructions ? ? ?[place] [address] ?[place] [address] ?Dept: [phone] ?Loc: [phone] ? ?Shahzain Kiester  09/10/2021 ? ?You are scheduled for a Peripheral Angiogram on Wednesday, April 19 with Dr. [name]. ? ?1. Please arrive at the Main Entrance A at [facility]: [place], [place] [address] at 6:30am. (This time is two hours before your procedure to ensure your preparation). Free valet parking service is available.  ? ?Special note: Every effort is made to have your procedure done on time.  Please understand that emergencies sometimes delay scheduled procedures. ? ?2. Diet: Do not eat solid foods after midnight.  You may have clear liquids until 5 AM upon the day of the procedure. ? ?3. Labs: You will need to have blood drawn- done. ? ?4. Medication instructions in preparation for your procedure: ? ?Take only 30 units of insulin the night before your procedure. Do not take any insulin on the day of the procedure. ? ?Hold AM insulin ? ?On the morning of your procedure, take Aspirin and any morning medicines NOT listed above.  You may use sips of water. ? ?5. Plan to go home the same day, you will only stay overnight if medically necessary. ?6. You MUST have a responsible adult to drive you home. ?7. An adult MUST be with you the first 24 hours after you arrive home. ?8. Bring a current list of your medications, and the last time and date medication taken. ?9. Bring ID and current insurance cards. ?10.Please wear clothes that are easy to get on and off and wear slip-on shoes. ? ?Thank you for allowing [place] to care for you! ?  -- [facility] ? ?

## 2021-09-10 NOTE — Progress Notes (Signed)
? ? ? ?09/10/2021 ?Raymond [name]   ?02/14/1961  ?[id number] ? ?Primary Physician [name], [place] ?Primary Cardiologist: Lorretta Harp MD Garret Reddish, [place], [place] ? ?HPI:  Raymond [name] is a 61 y.o.  moderately overweight separated African-American male father of 2 children, grandfather of 2 grandchildren who I last saw in the office 08/21/2021.[name]  He was was referred by Dr. Jacqualyn Posey, his podiatrist for peripheral vascular valuation because of a small ulcer on the plantar surface of his right first metatarsal.  He has a history of treated hypertension, diabetes and hyperlipidemia.  His father had an MI at age 85 and his brother recently had a myocardial infarction as well.  He is never had a heart attack or stroke.  He denies chest pain or shortness of breath.  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  He apparently stepped on a nail approximately 2 to 3 months ago and developed a wound on the plantar surface of his right first metatarsal which Dr. Jacqualyn Posey has been treating fairly frequently over the last several months.  Apparently this is slowly healing.  He had Dopplers performed in [place] recently that were told showed normal circulation.  Since I saw him 6 months ago the wound on his right foot is healed.  He is developed a new wound on his left foot with recent Dopplers that showed a left TBI 0.28, monophasic waveforms in his left SFA with tibial vessel disease, occluded anterior tibial and peroneal artery.  I suspect he will need a amputation of that toe since he has documented osteomyelitis according to Dr. Jacqualyn Posey.  Is unclear whether this will heal but I suspect based on his Dopplers that this will be difficult.  I am also concerned that angiography will lead to radiocontrast nephropathy and jeopardize his transplanted kidney. ?  ?He saw Dr. Jacqualyn Posey  who felt that his  wound was unlikely to heal and that he would require amputation.  Dr. Jacqualyn Posey spoke to the patient's nephrologist echoing my concern about exposure to radiocontrast in the setting of moderate renal insufficiency status post renal artery transplant and apparently the nephrologist felt comfortable proceeding with peripheral angiography potential intervention. ?  ?When I saw him in the office 08/21/2021 his right heel wound was poorly healing.  He has 0 vessel runoff below the knee by duplex ultrasound performed 04/02/2021.  I will arrange for him to undergo peripheral angiography on 08/28/2020 however this was canceled because of metabolic abnormalities of increased serum creatinine, increase serum glucose and potassium which has since resolved.  I am going to reschedule him tomorrow with Dr. Fletcher Anon  for peripheral angiography and potential intervention for limb salvage. ? ?Current Meds  ?Medication Sig  ? atorvastatin (LIPITOR) 10 MG tablet Take 10 mg by mouth daily.  ? blood glucose meter kit and supplies KIT Dispense based on patient and insurance preference. Use up to four times daily as directed. (FOR ICD-9 250.00, 250.01).  ? Blood Glucose Monitoring Suppl (ACCU-CHEK GUIDE ME) w/Device KIT 1 Piece by Does not apply route as directed.  ? brimonidine (ALPHAGAN) 0.2 % ophthalmic solution Place 1 drop into both eyes 3 (three) times daily.  ? cloNIDine (CATAPRES - DOSED IN MG/24 HR) 0.3 mg/24hr patch Place 1 patch (0.3 mg total) onto the skin once a week.  ? dorzolamide-timolol (COSOPT) 22.3-6.8 MG/ML ophthalmic solution Place 1 drop into  both eyes 2 (two) times daily.  ? doxazosin (CARDURA) 4 MG tablet Take 4 mg by mouth daily.  ? Dulaglutide (TRULICITY) 7.90 XY/3.3XO SOPN Inject 0.75 mg into the skin once a week. (Patient taking differently: Inject 0.75 mg into the skin every Sunday.)  ? glucose blood (ACCU-CHEK GUIDE) test strip Use as instructed 4 x daily. E11.65  ? hydrALAZINE (APRESOLINE) 25 MG tablet Take 25 mg by  mouth 2 (two) times daily.  ? HYDROcodone-acetaminophen (NORCO/VICODIN) 5-325 MG tablet Take 1 tablet by mouth every 4 (four) hours as needed for moderate pain.  ? Insulin NPH, Human,, Isophane, (NOVOLIN N FLEXPEN) 100 UNIT/ML Kiwkpen Inject 60 Units into the skin in the morning and at bedtime.  ? Insulin Regular Human (NOVOLIN R FLEXPEN RELION) 100 UNIT/ML KwikPen Inject 1-9 Units as directed 3 (three) times daily before meals. Sliding scale CBG 70 - 120: 0 units CBG 121 - 150: 1 unit,  CBG 151 - 200: 2 units,  CBG 201 - 250: 3 units,  CBG 251 - 300: 5 units,  CBG 301 - 350: 7 units,  CBG 351 - 400: 9 units   CBG > 400: 9 units and notify your doctor  ? latanoprost (XALATAN) 0.005 % ophthalmic solution Place 1 drop into both eyes at bedtime.  ? Multiple Vitamin (MULTIVITAMIN) capsule Take 1 capsule by mouth daily.  ? mupirocin ointment (BACTROBAN) 2 % Apply 1 application. topically 2 (two) times daily. For wound care  ? sodium bicarbonate 650 MG tablet Take 1,300 mg by mouth 3 (three) times daily.  ? sodium zirconium cyclosilicate (LOKELMA) 10 g PACK packet Take 10 g by mouth daily.  ? tacrolimus (PROGRAF) 1 MG capsule Take 4 capsules (4 mg total) by mouth 2 (two) times daily.  ? Vitamin D, Ergocalciferol, (DRISDOL) 50000 units CAPS capsule Take 50,000 Units by mouth every 30 (thirty) days.  ?  ? ?No Known Allergies ? ?Social History  ? ?Socioeconomic History  ? Marital status: Married  ?  Spouse name: Not on file  ? Number of children: Not on file  ? Years of education: Not on file  ? Highest education level: Not on file  ?Occupational History  ? Not on file  ?Tobacco Use  ? Smoking status: Never  ? Smokeless tobacco: Never  ?Vaping Use  ? Vaping Use: Never used  ?Substance and Sexual Activity  ? Alcohol use: No  ?  Alcohol/week: 0.0 standard drinks  ? Drug use: No  ? Sexual activity: Not on file  ?Other Topics Concern  ? Not on file  ?Social History Narrative  ? ** Merged History Encounter **  ?    ? ?Social  Determinants of Health  ? ?Financial Resource Strain: Not on file  ?Food Insecurity: Not on file  ?Transportation Needs: Not on file  ?Physical Activity: Not on file  ?Stress: Not on file  ?Social Connections: Not on file  ?Intimate Partner Violence: Not on file  ?  ? ?Review of Systems: ?General: negative for chills, fever, night sweats or weight changes.  ?Cardiovascular: negative for chest pain, dyspnea on exertion, edema, orthopnea, palpitations, paroxysmal nocturnal dyspnea or shortness of breath ?Dermatological: negative for rash ?Respiratory: negative for cough or wheezing ?Urologic: negative for hematuria ?Abdominal: negative for nausea, vomiting, diarrhea, bright red blood per rectum, melena, or hematemesis ?Neurologic: negative for visual changes, syncope, or dizziness ?All other systems reviewed and are otherwise negative except as noted above. ? ? ? ?Blood pressure 140/62, pulse 78, height  '5\' 7"'  (1.702 m), weight 222 lb 3.2 oz (100.8 kg), SpO2 99 %.  ?General appearance: alert and no distress ?Neck: no adenopathy, no carotid bruit, no JVD, supple, symmetrical, trachea midline, and thyroid not enlarged, symmetric, no tenderness/mass/nodules ?Lungs: clear to auscultation bilaterally ?Heart: regular rate and rhythm, S1, S2 normal, no murmur, click, rub or gallop ?Extremities: extremities normal, atraumatic, no cyanosis or edema ?Pulses: 2+ and symmetric ?Skin: Skin color, texture, turgor normal. No rashes or lesions ?Neurologic: Grossly normal ? ?EKG not performed today ? ?ASSESSMENT AND PLAN:  ? ?Critical limb ischemia with history of revascularization of same extremity ?Mr. Pickup returns today for follow-up.  He was scheduled to have peripheral angiography by Dr. Fletcher Anon several weeks ago however he was found to have renal insufficiency and hyperkalemia prior to the procedure which was canceled.  He was admitted to the hospital on 08/28/2021 and discharged 2 days later.  His oral antibiotics were  discontinued.  His metabolic abnormalities have resolved.  I am going to schedule him for angiography tomorrow. ? ? ? ? ?Lorretta Harp MD FACP,FACC,FAHA, FSCAI ?09/10/2021 ?9:10 AM ?

## 2021-09-10 NOTE — Telephone Encounter (Signed)
09/10/21 ? ?Long term disability can't be completed for patient. RN will call patient to advise.  ?

## 2021-09-11 ENCOUNTER — Other Ambulatory Visit: Payer: Self-pay

## 2021-09-11 ENCOUNTER — Ambulatory Visit (HOSPITAL_COMMUNITY)
Admission: RE | Admit: 2021-09-11 | Discharge: 2021-09-11 | Disposition: A | Discharge status: Home / Self Care | Payer: Self-pay | Attending: Cardiovascular Disease | Admitting: Cardiovascular Disease

## 2021-09-11 ENCOUNTER — Encounter (HOSPITAL_COMMUNITY)
Admission: RE | Disposition: A | Discharge status: Home / Self Care | Payer: Self-pay | Source: Home / Self Care | Attending: Cardiovascular Disease

## 2021-09-11 DIAGNOSIS — E785 Hyperlipidemia, unspecified: Secondary | ICD-10-CM | POA: Insufficient documentation

## 2021-09-11 DIAGNOSIS — Z794 Long term (current) use of insulin: Secondary | ICD-10-CM | POA: Insufficient documentation

## 2021-09-11 DIAGNOSIS — I70211 Atherosclerosis of native arteries of extremities with intermittent claudication, right leg: Secondary | ICD-10-CM

## 2021-09-11 DIAGNOSIS — E11621 Type 2 diabetes mellitus with foot ulcer: Secondary | ICD-10-CM | POA: Insufficient documentation

## 2021-09-11 DIAGNOSIS — L97519 Non-pressure chronic ulcer of other part of right foot with unspecified severity: Secondary | ICD-10-CM | POA: Insufficient documentation

## 2021-09-11 DIAGNOSIS — I70229 Atherosclerosis of native arteries of extremities with rest pain, unspecified extremity: Secondary | ICD-10-CM

## 2021-09-11 DIAGNOSIS — E1151 Type 2 diabetes mellitus with diabetic peripheral angiopathy without gangrene: Secondary | ICD-10-CM | POA: Insufficient documentation

## 2021-09-11 DIAGNOSIS — Z940 Kidney transplant status: Secondary | ICD-10-CM | POA: Insufficient documentation

## 2021-09-11 DIAGNOSIS — N289 Disorder of kidney and ureter, unspecified: Secondary | ICD-10-CM | POA: Insufficient documentation

## 2021-09-11 DIAGNOSIS — I10 Essential (primary) hypertension: Secondary | ICD-10-CM | POA: Insufficient documentation

## 2021-09-11 DIAGNOSIS — I70235 Atherosclerosis of native arteries of right leg with ulceration of other part of foot: Secondary | ICD-10-CM | POA: Insufficient documentation

## 2021-09-11 DIAGNOSIS — Z7985 Long-term (current) use of injectable non-insulin antidiabetic drugs: Secondary | ICD-10-CM | POA: Insufficient documentation

## 2021-09-11 HISTORY — PX: PERIPHERAL VASCULAR ATHERECTOMY: CATH118256

## 2021-09-11 HISTORY — PX: ABDOMINAL AORTOGRAM W/LOWER EXTREMITY: CATH118223

## 2021-09-11 LAB — GLUCOSE, CAPILLARY
Glucose-Capillary: 128 mg/dL — ABNORMAL HIGH (ref 70–99)
Glucose-Capillary: 48 mg/dL — ABNORMAL LOW (ref 70–99)
Glucose-Capillary: 56 mg/dL — ABNORMAL LOW (ref 70–99)
Glucose-Capillary: 75 mg/dL (ref 70–99)
Glucose-Capillary: 66 mg/dL — ABNORMAL LOW (ref 70–99)
Glucose-Capillary: 86 mg/dL (ref 70–99)
Glucose-Capillary: 87 mg/dL (ref 70–99)
Glucose-Capillary: 134 mg/dL — ABNORMAL HIGH (ref 70–99)

## 2021-09-11 LAB — POCT ACTIVATED CLOTTING TIME
Activated Clotting Time: 311 seconds
Activated Clotting Time: 281 seconds

## 2021-09-11 SURGERY — ABDOMINAL AORTOGRAM W/LOWER EXTREMITY
Anesthesia: LOCAL

## 2021-09-11 MED ORDER — ONDANSETRON HCL 4 MG/2ML IJ SOLN: 4.0000 mg | Freq: Four times a day (QID) | INTRAMUSCULAR | Status: DC | PRN

## 2021-09-11 MED ORDER — ASPIRIN EC 81 MG PO TBEC: 81.0000 mg | DELAYED_RELEASE_TABLET | Freq: Every day | ORAL | 0 refills | Status: AC

## 2021-09-11 MED ORDER — MIDAZOLAM HCL 2 MG/2ML IJ SOLN
INTRAMUSCULAR | Status: AC
  Filled 2021-09-11: qty 2

## 2021-09-11 MED ORDER — MIDAZOLAM HCL 2 MG/2ML IJ SOLN
INTRAMUSCULAR | Status: DC | PRN
  Administered 2021-09-11: 1 mg via INTRAVENOUS

## 2021-09-11 MED ORDER — FENTANYL CITRATE (PF) 100 MCG/2ML IJ SOLN
INTRAMUSCULAR | Status: DC | PRN
  Administered 2021-09-11: 50 ug via INTRAVENOUS

## 2021-09-11 MED ORDER — IODIXANOL 320 MG/ML IV SOLN
INTRAVENOUS | Status: DC | PRN
  Administered 2021-09-11: 45 mL

## 2021-09-11 MED ORDER — ASPIRIN 81 MG PO CHEW: 81.0000 mg | CHEWABLE_TABLET | ORAL | Status: DC

## 2021-09-11 MED ORDER — LIDOCAINE HCL (PF) 1 % IJ SOLN
INTRAMUSCULAR | Status: AC
  Filled 2021-09-11: qty 30

## 2021-09-11 MED ORDER — CLOPIDOGREL BISULFATE 300 MG PO TABS
ORAL_TABLET | ORAL | Status: AC
  Filled 2021-09-11: qty 2

## 2021-09-11 MED ORDER — NITROGLYCERIN 1 MG/10 ML FOR IR/CATH LAB
INTRA_ARTERIAL | Status: AC
  Filled 2021-09-11: qty 10

## 2021-09-11 MED ORDER — FENTANYL CITRATE (PF) 100 MCG/2ML IJ SOLN
INTRAMUSCULAR | Status: AC
  Filled 2021-09-11: qty 2

## 2021-09-11 MED ORDER — SODIUM CHLORIDE 0.9% FLUSH: 3.0000 mL | INTRAVENOUS | Status: DC | PRN

## 2021-09-11 MED ORDER — CLOPIDOGREL BISULFATE 300 MG PO TABS
ORAL_TABLET | ORAL | Status: DC | PRN
  Administered 2021-09-11: 600 mg via ORAL

## 2021-09-11 MED ORDER — LABETALOL HCL 5 MG/ML IV SOLN: 10.0000 mg | INTRAVENOUS | Status: DC | PRN

## 2021-09-11 MED ORDER — HEPARIN SODIUM (PORCINE) 1000 UNIT/ML IJ SOLN
INTRAMUSCULAR | Status: AC
  Filled 2021-09-11: qty 10

## 2021-09-11 MED ORDER — CLOPIDOGREL BISULFATE 75 MG PO TABS: 75.0000 mg | ORAL_TABLET | Freq: Every day | ORAL | 6 refills | Status: DC

## 2021-09-11 MED ORDER — SODIUM CHLORIDE 0.9 % IV SOLN: 250.0000 mL | INTRAVENOUS | Status: DC | PRN

## 2021-09-11 MED ORDER — ACETAMINOPHEN 325 MG PO TABS: 650.0000 mg | ORAL_TABLET | ORAL | Status: DC | PRN

## 2021-09-11 MED ORDER — SODIUM CHLORIDE 0.9% FLUSH: 3.0000 mL | Freq: Two times a day (BID) | INTRAVENOUS | Status: DC

## 2021-09-11 MED ORDER — DEXTROSE 50 % IV SOLN
25.0000 mL | Freq: Once | INTRAVENOUS | Status: AC
  Administered 2021-09-11: 25 mL via INTRAVENOUS

## 2021-09-11 MED ORDER — DEXTROSE-NACL 5-0.45 % IV SOLN: INTRAVENOUS | Status: DC

## 2021-09-11 MED ORDER — DEXTROSE 50 % IV SOLN
INTRAVENOUS | Status: AC
  Filled 2021-09-11: qty 50

## 2021-09-11 MED ORDER — NITROGLYCERIN 1 MG/10 ML FOR IR/CATH LAB
INTRA_ARTERIAL | Status: DC | PRN
  Administered 2021-09-11: 400 ug via INTRA_ARTERIAL

## 2021-09-11 MED ORDER — LIDOCAINE HCL (PF) 1 % IJ SOLN
INTRAMUSCULAR | Status: DC | PRN
  Administered 2021-09-11: 15 mL via INTRADERMAL

## 2021-09-11 MED ORDER — HEPARIN (PORCINE) IN NACL 1000-0.9 UT/500ML-% IV SOLN
INTRAVENOUS | Status: AC
  Filled 2021-09-11: qty 1000

## 2021-09-11 MED ORDER — SODIUM CHLORIDE 0.9 % IV SOLN: INTRAVENOUS | Status: AC

## 2021-09-11 MED ORDER — SODIUM CHLORIDE 0.9 % WEIGHT BASED INFUSION
3.0000 mL/kg/h | INTRAVENOUS | Status: AC
  Administered 2021-09-11: 3 mL/kg/h via INTRAVENOUS

## 2021-09-11 MED ORDER — HEPARIN SODIUM (PORCINE) 1000 UNIT/ML IJ SOLN
INTRAMUSCULAR | Status: DC | PRN
  Administered 2021-09-11: 10000 [IU] via INTRAVENOUS
  Administered 2021-09-11: 2000 [IU] via INTRAVENOUS

## 2021-09-11 MED ORDER — HEPARIN (PORCINE) IN NACL 1000-0.9 UT/500ML-% IV SOLN
INTRAVENOUS | Status: DC | PRN
Start: 2021-09-11 — End: 2021-09-11
  Administered 2021-09-11 (×2): 500 mL

## 2021-09-11 MED ORDER — SODIUM CHLORIDE 0.9 % WEIGHT BASED INFUSION
1.0000 mL/kg/h | INTRAVENOUS | Status: DC
  Administered 2021-09-11: 1 mL/kg/h via INTRAVENOUS

## 2021-09-11 SURGICAL SUPPLY — 24 items
BALLN COYOTE OTW 3.5X100X150 (BALLOONS) ×6
BALLN COYOTE OTW 3X120X150 (BALLOONS) ×3
BALLOON COYOTE OTW 3.5X100X150 (BALLOONS) IMPLANT
BALLOON COYOTE OTW 3X120X150 (BALLOONS) IMPLANT
CATH ANGIO 5F PIGTAIL 65CM (CATHETERS) ×1 IMPLANT
CATH CROSS OVER TEMPO 5F (CATHETERS) ×1 IMPLANT
CATH SHOCKWAVE 3.5X12 (CATHETERS) IMPLANT
CATH STRAIGHT 5FR 65CM (CATHETERS) ×1 IMPLANT
CATHETER SHOCKWAVE 3.5X12 (CATHETERS) ×3
DEVICE CLOSURE MYNXGRIP 6/7F (Vascular Products) ×1 IMPLANT
KIT ANGIASSIST CO2 SYSTEM (KITS) ×1 IMPLANT
KIT MICROPUNCTURE NIT STIFF (SHEATH) ×1 IMPLANT
KIT PV (KITS) ×3 IMPLANT
SHEATH PINNACLE 5F 10CM (SHEATH) ×1 IMPLANT
SHEATH PINNACLE 6F 10CM (SHEATH) ×1 IMPLANT
SHEATH PINNACLE ST 6F 65CM (SHEATH) ×1 IMPLANT
SHEATH PROBE COVER 6X72 (BAG) ×1 IMPLANT
STOPCOCK MORSE 400PSI 3WAY (MISCELLANEOUS) ×1 IMPLANT
TAPE SHOOT N SEE (TAPE) ×1 IMPLANT
TRANSDUCER W/STOPCOCK (MISCELLANEOUS) ×3 IMPLANT
TRAY PV CATH (CUSTOM PROCEDURE TRAY) ×3 IMPLANT
TUBING CIL FLEX 10 FLL-RA (TUBING) ×1 IMPLANT
WIRE HITORQ VERSACORE ST 145CM (WIRE) ×1 IMPLANT
WIRE RUNTHROUGH .014X300CM (WIRE) ×1 IMPLANT

## 2021-09-11 NOTE — Progress Notes (Signed)
Pre-operative patient called to nurses station, states he feels like his blood sugar is dropping. CNA checked CBG was 48. Writer notified Dr. Fletcher Anon. Gave verbal orders to give 20m Dextrose IV and recheck CBG in 30 minutes. Will continue to monitor.  ?

## 2021-09-11 NOTE — Progress Notes (Signed)
Spoke to Dr Audelia Acton regarding BP, pt to take home BP meds as soon as home, pt and family member verbalized understanding   ?

## 2021-09-11 NOTE — Progress Notes (Signed)
Rechecked CBG at 0930. CBG 75. Patient states he's feeling better. Will continue to monitor.  ?

## 2021-09-11 NOTE — Progress Notes (Signed)
Patients CBG re-checked at 1000 and is 56. 25 ml of D50 given. Rennis Harding, RN made aware. Will re-check and continue to monitor.  ?

## 2021-09-11 NOTE — Interval H&P Note (Signed)
History and Physical Interval Note: ? ?09/11/2021 ?11:01 AM ? ?Raymond [name]  has presented today for surgery, with the diagnosis of critical limb ischemia.  The various methods of treatment have been discussed with the patient and family. After consideration of risks, benefits and other options for treatment, the patient has consented to  Procedure(s): ?ABDOMINAL AORTOGRAM W/LOWER EXTREMITY (N/A) as a surgical intervention.  The patient's history has been reviewed, patient examined, no change in status, stable for surgery.  I have reviewed the patient's chart and labs.  Questions were answered to the patient's satisfaction.   ? ? ?[name] ? ? ?

## 2021-09-11 NOTE — Progress Notes (Signed)
CRITICAL VALUE STICKER ? ?CRITICAL VALUE: CBG 48 ? ?RECEIVER (on-site recipient of call): Dr. Fletcher Anon ? ?DATE & TIME NOTIFIED: 09/11/21 0900 ? ?MESSENGER (representative from lab): Danton Sewer, RN ? ?MD NOTIFIED: Dr. Fletcher Anon ? ?TIME OF NOTIFICATION: 0900 ? ?RESPONSE:  Administer 4m Dextrose IV and recheck blood sugar in 30 minutes.  ?

## 2021-09-12 ENCOUNTER — Encounter (HOSPITAL_COMMUNITY): Payer: Self-pay | Admitting: Cardiovascular Disease

## 2021-09-12 ENCOUNTER — Ambulatory Visit (INDEPENDENT_AMBULATORY_CARE_PROVIDER_SITE_OTHER): Payer: Self-pay | Admitting: Sports Medicine

## 2021-09-12 DIAGNOSIS — I998 Other disorder of circulatory system: Secondary | ICD-10-CM

## 2021-09-12 DIAGNOSIS — L97512 Non-pressure chronic ulcer of other part of right foot with fat layer exposed: Secondary | ICD-10-CM

## 2021-09-12 DIAGNOSIS — I739 Peripheral vascular disease, unspecified: Secondary | ICD-10-CM

## 2021-09-12 DIAGNOSIS — E0843 Diabetes mellitus due to underlying condition with diabetic autonomic (poly)neuropathy: Secondary | ICD-10-CM

## 2021-09-12 MED ORDER — MUPIROCIN 2 % EX OINT: 1.0000 "application " | TOPICAL_OINTMENT | Freq: Two times a day (BID) | CUTANEOUS | 0 refills | Status: DC

## 2021-09-12 NOTE — Progress Notes (Signed)
Subjective: ?Raymond [name] is a 61 y.o. male patient seen today in office for POV #4 in office status post right foot incision and drainage plantar wound performed on 08/15/21 and later bedside lancing of blisters that developed subsequently a few days later after original procedure at the right fourth toe.  Patient is also status post vascular revascularization performed on 09/11/2021. Denies fever or constitutional symptoms.  ? ?Patient is diabetic blood sugar not recorded at this visit. ? ?Patient Active Problem List  ? Diagnosis Date Noted  ? Hyperkalemia 08/28/2021  ? Obesity (BMI 30-39.9) 08/28/2021  ? History of anemia due to chronic kidney disease 08/28/2021  ? Diabetic foot ulcer ([place]) 08/14/2021  ? CKD (chronic kidney disease) stage 3, GFR 30-59 ml/min (HCC) 08/14/2021  ? Hypomagnesemia 06/25/2021  ? Septic arthritis of elbow, left ([place]) 06/24/2021  ? Peripheral vascular disease ([place]) 04/24/2020  ? Pyogenic inflammation of bone (HCC)   ? Thrombophlebitis of superficial veins of right lower extremity   ? Difficult intravenous access   ? MRSA bacteremia   ? Diabetic foot infection ([place])   ? Infected wound 01/12/2020  ? Critical lower limb ischemia ([place]) 02/02/2019  ? Personal history of noncompliance with medical treatment, presenting hazards to health 10/07/2018  ? Critical limb ischemia with history of revascularization of same extremity ([place]) 07/27/2018  ? Mixed hyperlipidemia 02/19/2018  ? History of renal transplant 02/19/2018  ? Conductive hearing loss, bilateral 08/31/2017  ? Nuclear sclerotic cataract of right eye 10/07/2016  ? Pseudophakia of left eye 10/07/2016  ? Hypertension due to endocrine disorder 03/04/2016  ? 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 Renal failure 03/04/2016  ? Proliferative diabetic retinopathy with macular edema associated with type 2 diabetes mellitus ([place]) 03/04/2016  ?  Immunosuppression ([place]) 12/10/2015  ? Nausea vomiting and diarrhea 12/10/2015  ? Type 2 diabetes mellitus ([place]) 12/10/2015  ? Essential hypertension, benign 12/10/2015  ? Pancytopenia ([place]) 12/10/2015  ? Encounter for screening colonoscopy 11/15/2014  ? GERD (gastroesophageal reflux disease) 11/15/2014  ? ? ?Current Outpatient Medications on File Prior to Visit  ?Medication Sig Dispense Refill  ? aspirin EC 81 MG tablet Take 1 tablet (81 mg total) by mouth daily. Swallow whole. 150 tablet 0  ? atorvastatin (LIPITOR) 10 MG tablet Take 10 mg by mouth daily.    ? blood glucose meter kit and supplies KIT Dispense based on patient and insurance preference. Use up to four times daily as directed. (FOR ICD-9 250.00, 250.01). 1 each 0  ? Blood Glucose Monitoring Suppl (ACCU-CHEK GUIDE ME) w/Device KIT 1 Piece by Does not apply route as directed. 1 kit 0  ? brimonidine (ALPHAGAN) 0.2 % ophthalmic solution Place 1 drop into both eyes 3 (three) times daily.    ? cloNIDine (CATAPRES - DOSED IN MG/24 HR) 0.3 mg/24hr patch Place 1 patch (0.3 mg total) onto the skin once a week. 4 patch 12  ? clopidogrel (PLAVIX) 75 MG tablet Take 1 tablet (75 mg total) by mouth daily. 30 tablet 6  ? dorzolamide-timolol (COSOPT) 22.3-6.8 MG/ML ophthalmic solution Place 1 drop into both eyes 2 (two) times daily.    ? doxazosin (CARDURA) 4 MG tablet Take 4 mg by mouth daily.    ? Dulaglutide (TRULICITY) 7.12 WP/8.0DX SOPN Inject 0.75 mg into the skin once a week. (Patient taking differently: Inject 0.75 mg into the skin every Sunday.) 6 mL 3  ? glucose blood (ACCU-CHEK  GUIDE) test strip Use as instructed 4 x daily. E11.65 150 each 5  ? hydrALAZINE (APRESOLINE) 25 MG tablet Take 25 mg by mouth 2 (two) times daily.    ? HYDROcodone-acetaminophen (NORCO/VICODIN) 5-325 MG tablet Take 1 tablet by mouth every 4 (four) hours as needed for moderate pain. 20 tablet 0  ? Insulin NPH, Human,, Isophane, (NOVOLIN N FLEXPEN) 100 UNIT/ML Kiwkpen Inject 60 Units  into the skin in the morning and at bedtime.    ? Insulin Regular Human (NOVOLIN R FLEXPEN RELION) 100 UNIT/ML KwikPen Inject 1-9 Units as directed 3 (three) times daily before meals. Sliding scale CBG 70 - 120: 0 units CBG 121 - 150: 1 unit,  CBG 151 - 200: 2 units,  CBG 201 - 250: 3 units,  CBG 251 - 300: 5 units,  CBG 301 - 350: 7 units,  CBG 351 - 400: 9 units   CBG > 400: 9 units and notify your doctor 15 mL 1  ? latanoprost (XALATAN) 0.005 % ophthalmic solution Place 1 drop into both eyes at bedtime.    ? Multiple Vitamin (MULTIVITAMIN) capsule Take 1 capsule by mouth daily.    ? sodium bicarbonate 650 MG tablet Take 1,300 mg by mouth 3 (three) times daily.    ? sodium zirconium cyclosilicate (LOKELMA) 10 g PACK packet Take 10 g by mouth daily. 30 packet 1  ? tacrolimus (PROGRAF) 1 MG capsule Take 4 capsules (4 mg total) by mouth 2 (two) times daily.    ? Vitamin D, Ergocalciferol, (DRISDOL) 50000 units CAPS capsule Take 50,000 Units by mouth every 30 (thirty) days.    ? ?Current Facility-Administered Medications on File Prior to Visit  ?Medication Dose Route Frequency Provider Last Rate Last Admin  ? sodium chloride flush (NS) 0.9 % injection 3 mL  3 mL Intravenous Q12H Wellington Hampshire, MD      ? ? ?No Known Allergies ? ?Objective: ?There were no vitals filed for this visit. ? ?General: No acute distress, AAOx3  ?Right foot: There is a full-thickness granular wound to the plantar aspect of the right foot that measures doubled in size in the width now measures 7-1/2 x 4 cm with granular base.  Much improved from prior no maceration, minimal bloody drainage, no malodor, right third and fourth toe has a deroofed blister and all of the previous eschar skin has peeled off and that there is only a small eschar noted at the distal tuft of the right fourth toe with significant interdigital maceration that appears to be slowly improving.  Edema is much improved on the right lower extremity.  DP and PT pedal pulses  are diminished.  Status post vascular revascularization. no pain to palpation to lower leg or calf. ? ?Assessment and Plan:  ?Problem List Items Addressed This Visit   ?None ?Visit Diagnoses   ? ? Foot ulcer, right, with fat layer exposed ([place])    -  Primary  ? Ischemia of toe      ? PAD (peripheral artery disease) ([place])      ? Diabetes mellitus due to underlying condition with diabetic autonomic neuropathy, unspecified whether long term insulin use ([place])      ? ?  ? ? ?-Patient seen and evaluated ?-Applied Betadine periwound and in between all toes and applied antibiotic ointment to the central plantar wound which is granular covered with dry dressing.  Advised patient to have wife to continue with the same.  Advised patient he is still at  risk of amputation to multiple toes if his right fourth toe does not improve ?-Advised patient to continue with rest and elevation for pain and edema control and Ace wrap to the level of the knee to help with swelling advised patient if there is pain and increased swelling to the foot or lower leg to return to office sooner or to go to ER ?-Continue with weightbearing only to heel with surgical shoe as previous ?-Patient to return as scheduled on next week for wound assessment and advised patient at this visit we will decide if we will return the wound VAC and decide on possible need for further surgery since he is status post vascular intervention or sooner problems or issues arise.   ?Landis Martins, DPM ? ?

## 2021-09-18 ENCOUNTER — Ambulatory Visit (HOSPITAL_COMMUNITY)
Admission: RE | Admit: 2021-09-18 | Discharge: 2021-09-18 | Disposition: A | Discharge status: Home / Self Care | Payer: Self-pay | Source: Ambulatory Visit | Attending: Cardiovascular Disease | Admitting: Cardiovascular Disease

## 2021-09-18 DIAGNOSIS — I70229 Atherosclerosis of native arteries of extremities with rest pain, unspecified extremity: Secondary | ICD-10-CM | POA: Insufficient documentation

## 2021-09-18 DIAGNOSIS — Z9889 Other specified postprocedural states: Secondary | ICD-10-CM | POA: Insufficient documentation

## 2021-09-18 DIAGNOSIS — I70221 Atherosclerosis of native arteries of extremities with rest pain, right leg: Secondary | ICD-10-CM

## 2021-09-19 ENCOUNTER — Ambulatory Visit (INDEPENDENT_AMBULATORY_CARE_PROVIDER_SITE_OTHER): Payer: Self-pay | Admitting: Sports Medicine

## 2021-09-19 ENCOUNTER — Other Ambulatory Visit: Payer: Self-pay | Admitting: Sports Medicine

## 2021-09-19 ENCOUNTER — Telehealth: Payer: Self-pay | Admitting: *Deleted

## 2021-09-19 DIAGNOSIS — I998 Other disorder of circulatory system: Secondary | ICD-10-CM

## 2021-09-19 DIAGNOSIS — M86171 Other acute osteomyelitis, right ankle and foot: Secondary | ICD-10-CM

## 2021-09-19 DIAGNOSIS — L97512 Non-pressure chronic ulcer of other part of right foot with fat layer exposed: Secondary | ICD-10-CM

## 2021-09-19 DIAGNOSIS — I739 Peripheral vascular disease, unspecified: Secondary | ICD-10-CM

## 2021-09-19 DIAGNOSIS — E0843 Diabetes mellitus due to underlying condition with diabetic autonomic (poly)neuropathy: Secondary | ICD-10-CM

## 2021-09-19 NOTE — Progress Notes (Signed)
Discussed nonhealing toe ulcer and dusky appearance of right foot with Dr. Gwenlyn Found. Patient has occlusion and endovascular procedure did not restore blood flow to the foot and per Dr. Gwenlyn Found patient would like require BKA. Referal placed to orthopedics for an evaluation. ?-Dr. Cannon Kettle ?

## 2021-09-19 NOTE — Telephone Encounter (Signed)
Patient has been scheduled for upcoming appointment w/ Dr Sharol Given 09/23/21'@12'$ :45,was unable to reach patient but contacted the wife and given phone # if he needs to reschedule. ?

## 2021-09-19 NOTE — Progress Notes (Addendum)
Subjective: ?Purnell Daigle is a 61 y.o. male patient seen today in office for POV #5 in office status post right foot incision and drainage plantar wound performed on 08/15/21 and later bedside lancing of blisters that developed subsequently a few days later after original procedure at the right fourth toe.  Patient is also status post vascular revascularization performed on 09/11/2021 and had ultrasound on yesterday. Denies fever or constitutional symptoms.  ? ?Patient is diabetic blood sugar not recorded at this visit. ? ?Patient Active Problem List  ? Diagnosis Date Noted  ? Hyperkalemia 08/28/2021  ? Obesity (BMI 30-39.9) 08/28/2021  ? History of anemia due to chronic kidney disease 08/28/2021  ? Diabetic foot ulcer ([place]) 08/14/2021  ? CKD (chronic kidney disease) stage 3, GFR 30-59 ml/min (HCC) 08/14/2021  ? Hypomagnesemia 06/25/2021  ? Septic arthritis of elbow, left ([place]) 06/24/2021  ? Peripheral vascular disease ([place]) 04/24/2020  ? Pyogenic inflammation of bone (HCC)   ? Thrombophlebitis of superficial veins of right lower extremity   ? Difficult intravenous access   ? MRSA bacteremia   ? Diabetic foot infection ([place])   ? Infected wound 01/12/2020  ? Critical lower limb ischemia ([place]) 02/02/2019  ? Personal history of noncompliance with medical treatment, presenting hazards to health 10/07/2018  ? Critical limb ischemia with history of revascularization of same extremity ([place]) 07/27/2018  ? Mixed hyperlipidemia 02/19/2018  ? History of renal transplant 02/19/2018  ? Conductive hearing loss, bilateral 08/31/2017  ? Nuclear sclerotic cataract of right eye 10/07/2016  ? Pseudophakia of left eye 10/07/2016  ? Hypertension due to endocrine disorder 03/04/2016  ? 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 Renal failure 03/04/2016  ? Proliferative diabetic retinopathy with macular edema associated with type 2 diabetes  mellitus ([place]) 03/04/2016  ? Immunosuppression ([place]) 12/10/2015  ? Nausea vomiting and diarrhea 12/10/2015  ? Type 2 diabetes mellitus ([place]) 12/10/2015  ? Essential hypertension, benign 12/10/2015  ? Pancytopenia ([place]) 12/10/2015  ? Encounter for screening colonoscopy 11/15/2014  ? GERD (gastroesophageal reflux disease) 11/15/2014  ? ? ?Current Outpatient Medications on File Prior to Visit  ?Medication Sig Dispense Refill  ? aspirin EC 81 MG tablet Take 1 tablet (81 mg total) by mouth daily. Swallow whole. 150 tablet 0  ? atorvastatin (LIPITOR) 10 MG tablet Take 10 mg by mouth daily.    ? blood glucose meter kit and supplies KIT Dispense based on patient and insurance preference. Use up to four times daily as directed. (FOR ICD-9 250.00, 250.01). 1 each 0  ? Blood Glucose Monitoring Suppl (ACCU-CHEK GUIDE ME) w/Device KIT 1 Piece by Does not apply route as directed. 1 kit 0  ? brimonidine (ALPHAGAN) 0.2 % ophthalmic solution Place 1 drop into both eyes 3 (three) times daily.    ? cloNIDine (CATAPRES - DOSED IN MG/24 HR) 0.3 mg/24hr patch Place 1 patch (0.3 mg total) onto the skin once a week. 4 patch 12  ? clopidogrel (PLAVIX) 75 MG tablet Take 1 tablet (75 mg total) by mouth daily. 30 tablet 6  ? dorzolamide-timolol (COSOPT) 22.3-6.8 MG/ML ophthalmic solution Place 1 drop into both eyes 2 (two) times daily.    ? doxazosin (CARDURA) 4 MG tablet Take 4 mg by mouth daily.    ? Dulaglutide (TRULICITY) 7.42 VZ/5.6LO SOPN Inject 0.75 mg into the skin once a week. (Patient taking differently: Inject 0.75 mg into the skin every Sunday.) 6 mL 3  ?  glucose blood (ACCU-CHEK GUIDE) test strip Use as instructed 4 x daily. E11.65 150 each 5  ? hydrALAZINE (APRESOLINE) 25 MG tablet Take 25 mg by mouth 2 (two) times daily.    ? HYDROcodone-acetaminophen (NORCO/VICODIN) 5-325 MG tablet Take 1 tablet by mouth every 4 (four) hours as needed for moderate pain. 20 tablet 0  ? Insulin NPH, Human,, Isophane, (NOVOLIN N FLEXPEN) 100  UNIT/ML Kiwkpen Inject 60 Units into the skin in the morning and at bedtime.    ? Insulin Regular Human (NOVOLIN R FLEXPEN RELION) 100 UNIT/ML KwikPen Inject 1-9 Units as directed 3 (three) times daily before meals. Sliding scale CBG 70 - 120: 0 units CBG 121 - 150: 1 unit,  CBG 151 - 200: 2 units,  CBG 201 - 250: 3 units,  CBG 251 - 300: 5 units,  CBG 301 - 350: 7 units,  CBG 351 - 400: 9 units   CBG > 400: 9 units and notify your doctor 15 mL 1  ? latanoprost (XALATAN) 0.005 % ophthalmic solution Place 1 drop into both eyes at bedtime.    ? Multiple Vitamin (MULTIVITAMIN) capsule Take 1 capsule by mouth daily.    ? mupirocin ointment (BACTROBAN) 2 % Apply 1 application. topically 2 (two) times daily. For wound care 30 g 0  ? sodium bicarbonate 650 MG tablet Take 1,300 mg by mouth 3 (three) times daily.    ? sodium zirconium cyclosilicate (LOKELMA) 10 g PACK packet Take 10 g by mouth daily. 30 packet 1  ? tacrolimus (PROGRAF) 1 MG capsule Take 4 capsules (4 mg total) by mouth 2 (two) times daily.    ? Vitamin D, Ergocalciferol, (DRISDOL) 50000 units CAPS capsule Take 50,000 Units by mouth every 30 (thirty) days.    ? ?Current Facility-Administered Medications on File Prior to Visit  ?Medication Dose Route Frequency Provider Last Rate Last Admin  ? sodium chloride flush (NS) 0.9 % injection 3 mL  3 mL Intravenous Q12H Wellington Hampshire, MD      ? ? ?No Known Allergies ? ?Objective: ?There were no vitals filed for this visit. ? ?General: No acute distress, AAOx3  ?Right foot: There is a full-thickness granular wound to the plantar aspect of the right foot that measures 7 x 4 x0.2 cm with granular base, No maceration, minimal bloody drainage, no malodor, right third and fourth toe has a deroofed blister and all of the previous eschar skin has peeled off and that there is only a small eschar noted at the distal tuft of the right fourth toe and now 3rd toe with maceration.  Dusky appearance to top of the foot.  DP and  PT pedal pulses are diminished.  Status post vascular revascularization. Minimal pain to palpation to lower leg or calf, Homan's sign negative. ? ? ? ? ? ? ?Assessment and Plan:  ?Problem List Items Addressed This Visit   ?None ?Visit Diagnoses   ? ? Foot ulcer, right, with fat layer exposed ([place])    -  Primary  ? Ischemia of toe      ? PAD (peripheral artery disease) ([place])      ? Diabetes mellitus due to underlying condition with diabetic autonomic neuropathy, unspecified whether long term insulin use ([place])      ? ?  ? ? ?-Patient seen and evaluated ?-Applied Betadine periwound and in between all toes and applied antibiotic ointment and calcium alginate to the central plantar wound which is granular covered with dry dressing.  Advised patient to have wife to continue with the same.  Advised patient he is still at risk of amputation and encouraged him to go to hospital but patient does not want to go. I advised him that I would reach out to his vascular team to see if they think he has the potential to heal if we arrange for outpatient amputation of the toes ?-Advised patient to continue with rest and elevation for pain and edema control and Ace wrap to the level of the knee to help with swelling advised patient if there is pain and increased swelling to the foot or lower leg to return to office sooner or to go to ER and to monitor temp ?-Continue with weightbearing only to heel with surgical shoe as previous ?-Patient to return as scheduled on next week or sooner if issues arise.  ?  ?Landis Martins, DPM ? ?

## 2021-09-20 ENCOUNTER — Ambulatory Visit: Payer: Self-pay | Admitting: Cardiovascular Disease

## 2021-09-23 ENCOUNTER — Ambulatory Visit (INDEPENDENT_AMBULATORY_CARE_PROVIDER_SITE_OTHER): Payer: 59 | Admitting: Orthopedic Surgery

## 2021-09-23 ENCOUNTER — Encounter: Payer: Self-pay | Admitting: Orthopedic Surgery

## 2021-09-23 DIAGNOSIS — E1142 Type 2 diabetes mellitus with diabetic polyneuropathy: Secondary | ICD-10-CM

## 2021-09-23 DIAGNOSIS — E43 Unspecified severe protein-calorie malnutrition: Secondary | ICD-10-CM | POA: Diagnosis not present

## 2021-09-23 DIAGNOSIS — I739 Peripheral vascular disease, unspecified: Secondary | ICD-10-CM

## 2021-09-23 DIAGNOSIS — I96 Gangrene, not elsewhere classified: Secondary | ICD-10-CM

## 2021-09-23 NOTE — Progress Notes (Signed)
? ?Office Visit Note ?  ?Patient: Raymond [name]           ?Date of Birth: [birth year]           ?MRN: [id number] ?Visit Date: 09/23/2021 ?             ?Requested by: Landis Martins, DPM ?[address] ?[address] ?[place],  [place] [address] ?PCP: [name], FNP ? ?Chief Complaint  ?Patient presents with  ? Right Foot - Wound Check  ? ? ? ? ?HPI: ?Patient is a 61 year old gentleman who is seen in consultation for gangrene of the toes of the right foot with a plantar ulcer.  Patient is status post endovascular reconstruction on April 19.  Prior to surgery his ankle-brachial indices were monophasic and noncompressible. ? ?Patient is diabetic renal disease hypertension he is status post a renal transplant not on dialysis. ? ?Assessment & Plan: ?Visit Diagnoses:  ?1. PVD (peripheral vascular disease) ([place])   ?2. Gangrene of toe of right foot ([place])   ?3. Severe protein-calorie malnutrition ([place])   ?4. Diabetic polyneuropathy associated with type 2 diabetes mellitus ([place])   ? ? ?Plan: We will proceed with a transmetatarsal amputation.  Discussed risks and benefits of surgery discussed risks of a higher level amputation difficulty healing.  Patient states he understands wished to proceed at this time recommended protein powder supplements twice a day. ? ?Follow-Up Instructions: Return in about 1 week (around 09/30/2021).  ? ?Ortho Exam ? ?Patient is alert, oriented, no adenopathy, well-dressed, normal affect, normal respiratory effort. ?Examination patient does not have palpable pulses the Doppler was used and he has a good biphasic dorsalis pedis and posterior tibial pulse status post revascularization.  He does have some venous stasis swelling but no venous ulcers.  There is necrotic ulceration to the third fourth and fifth toes.  His albumin is 2.1 hemoglobin A1c 10.4.  Last year his hemoglobin A1c was greater than 15. ? ?Imaging: ?No results found. ?No images are attached to the encounter. ? ?Labs: ?Lab Results   ?Component Value Date  ? HGBA1C 10.4 (H) 08/14/2021  ? HGBA1C 10.6 (H) 06/24/2021  ? HGBA1C >15 03/05/2021  ? ESRSEDRATE 88 (H) 08/14/2021  ? ESRSEDRATE 47 (H) 07/12/2021  ? ESRSEDRATE 39 (H) 07/08/2021  ? CRP 25.2 (H) 08/14/2021  ? CRP 11.6 (H) 06/27/2021  ? CRP 2.7 04/17/2021  ? REPTSTATUS 08/20/2021 FINAL 08/15/2021  ? GRAMSTAIN  08/15/2021  ?  NO SQUAMOUS EPITHELIAL CELLS SEEN ?RARE WBC SEEN ?RARE GRAM POSITIVE COCCI ?  ? CULT  08/15/2021  ?  MODERATE STREPTOCOCCUS AGALACTIAE ?TESTING AGAINST S. AGALACTIAE NOT ROUTINELY PERFORMED DUE TO PREDICTABILITY OF AMP/PEN/VAN SUSCEPTIBILITY. ?MODERATE STAPHYLOCOCCUS LUGDUNENSIS ?NO ANAEROBES ISOLATED ?Performed at [facility], [place] [address], [place], [place] [address] ?  ? [place] STAPHYLOCOCCUS LUGDUNENSIS 08/15/2021  ? ? ? ?Lab Results  ?Component Value Date  ? ALBUMIN 2.3 (L) 08/30/2021  ? ALBUMIN 2.1 (L) 08/29/2021  ? ALBUMIN 3.2 (L) 07/12/2021  ? PREALBUMIN <5 (L) 08/14/2021  ? ? ?Lab Results  ?Component Value Date  ? MG 1.3 (L) 08/29/2021  ? MG 1.9 06/26/2021  ? MG 1.4 (L) 06/25/2021  ? ?Lab Results  ?Component Value Date  ? VD25OH 23.8 (L) 12/11/2020  ? ? ?Lab Results  ?Component Value Date  ? PREALBUMIN <5 (L) 08/14/2021  ? ? ?  Latest Ref Rng & Units 08/30/2021  ? 10:19 AM 08/29/2021  ?  6:56 AM 08/28/2021  ?  4:09  PM  ?CBC EXTENDED  ?WBC 4.0 - 10.5 K/uL 5.0   5.9   5.0    ?RBC 4.22 - 5.81 MIL/uL 3.97   3.73   3.87    ?Hemoglobin 13.0 - 17.0 g/dL 9.8   9.3   9.6    ?HCT 39.0 - 52.0 % 32.9   31.3   33.1    ?Platelets 150 - 400 K/uL 359   336   344    ?NEUT# 1.7 - 7.7 K/uL 3.1   3.8   2.4    ?Lymph# 0.7 - 4.0 K/uL 1.4   1.5   1.8    ? ? ? ?There is no height or weight on file to calculate BMI. ? ?Orders:  ?No orders of the defined types were placed in this encounter. ? ?No orders of the defined types were placed in this encounter. ? ? ? Procedures: ?No procedures performed ? ?Clinical Data: ?No additional findings. ? ?ROS: ? ?All other systems negative, except  as noted in the HPI. ?Review of Systems ? ?Objective: ?Vital Signs: There were no vitals taken for this visit. ? ?Specialty Comments:  ?No specialty comments available. ? ?PMFS History: ?Patient Active Problem List  ? Diagnosis Date Noted  ? Hyperkalemia 08/28/2021  ? Obesity (BMI 30-39.9) 08/28/2021  ? History of anemia due to chronic kidney disease 08/28/2021  ? Diabetic foot ulcer ([place]) 08/14/2021  ? CKD (chronic kidney disease) stage 3, GFR 30-59 ml/min (HCC) 08/14/2021  ? Hypomagnesemia 06/25/2021  ? Septic arthritis of elbow, left ([place]) 06/24/2021  ? Peripheral vascular disease ([place]) 04/24/2020  ? Pyogenic inflammation of bone (HCC)   ? Thrombophlebitis of superficial veins of right lower extremity   ? Difficult intravenous access   ? MRSA bacteremia   ? Diabetic foot infection ([place])   ? Infected wound 01/12/2020  ? Critical lower limb ischemia ([place]) 02/02/2019  ? Personal history of noncompliance with medical treatment, presenting hazards to health 10/07/2018  ? Critical limb ischemia with history of revascularization of same extremity ([place]) 07/27/2018  ? Mixed hyperlipidemia 02/19/2018  ? History of renal transplant 02/19/2018  ? Conductive hearing loss, bilateral 08/31/2017  ? Nuclear sclerotic cataract of right eye 10/07/2016  ? Pseudophakia of left eye 10/07/2016  ? Hypertension due to endocrine disorder 03/04/2016  ? 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 Renal failure 03/04/2016  ? Proliferative diabetic retinopathy with macular edema associated with type 2 diabetes mellitus ([place]) 03/04/2016  ? Immunosuppression ([place]) 12/10/2015  ? Nausea vomiting and diarrhea 12/10/2015  ? Type 2 diabetes mellitus ([place]) 12/10/2015  ? Essential hypertension, benign 12/10/2015  ? Pancytopenia ([place]) 12/10/2015  ? Encounter for screening colonoscopy 11/15/2014  ? GERD (gastroesophageal reflux disease) 11/15/2014  ? ?Past Medical  History:  ?Diagnosis Date  ? Chronic kidney disease   ? Awaiting transplant  ? Diabetes ([place])   ? Diabetes mellitus without complication ([place])   ? Hypertension   ? Renal disorder   ? Renal insufficiency   ?  ?Family History  ?Problem Relation Age of Onset  ? Diabetes Mother   ? Heart disease Father   ? Colon cancer Neg Hx   ?  ?Past Surgical History:  ?Procedure Laterality Date  ? ABDOMINAL AORTOGRAM W/LOWER EXTREMITY N/A 09/11/2021  ? Procedure: ABDOMINAL AORTOGRAM W/LOWER EXTREMITY;  Surgeon: Wellington Hampshire, MD;  Location: [facility];  Service: Cardiovascular;  Laterality: N/A;  ? BONE  BIOPSY Right 03/06/2021  ? Procedure: BONE BIOPSY;  Surgeon: Trula Slade, DPM;  Location: [facility];  Service: Podiatry;  Laterality: Right;  ? CENTRAL VENOUS CATHETER INSERTION Right 01/20/2020  ? Procedure: INSERTION CENTRAL LINE ADULT; Tunneled central line;  Surgeon: Virl Cagey, MD;  Location: [facility];  Service: General;  Laterality: Right;  ? COLONOSCOPY N/A 12/05/2014  ? IFO:YDXAJOIN external and internal hemorrhoid/mild diverticulosis/11 polyps removed  ? ESOPHAGOGASTRODUODENOSCOPY N/A 12/05/2014  ? OMV:[facility] duodenitis  ? GRAFT APPLICATION Right [id number]/9628  ? Procedure: GRAFT APPLICATION;  Surgeon: Trula Slade, DPM;  Location: [facility];  Service: Podiatry;  Laterality: Right;  ? INCISION AND DRAINAGE OF WOUND Right 08/15/2021  ? Procedure: IRRIGATION AND DEBRIDEMENT WOUND;  Surgeon: Landis Martins, DPM;  Location: [facility];  Service: Podiatry;  Laterality: Right;  need to make card not  a irrigation and debridement card  ? IR FLUORO GUIDE CV LINE RIGHT  03/01/2019  ? IR PERC TUN PERIT CATH WO PORT S&I /IMAG  06/27/2021  ? IR REMOVAL TUN CV CATH W/O FL  05/10/2019  ? IR REMOVAL TUN CV CATH W/O FL  02/29/2020  ? IR REMOVAL TUN CV CATH W/O FL  07/19/2021  ? IR US GUIDE VASC ACCESS RIGHT  03/01/2019  ? IR US GUIDE VASC ACCESS RIGHT  06/27/2021  ? IRRIGATION AND DEBRIDEMENT ELBOW Left 06/24/2021  ? Procedure:  IRRIGATION AND DEBRIDEMENT ELBOW;  Surgeon: Lovell Sheehan, MD;  Location: [facility];  Service: Orthopedics;  Laterality: Left;  ? KIDNEY TRANSPLANT  03/27/2015  ? Penile Pump Insertion    ? PERIPHERAL VASCULAR A

## 2021-09-24 ENCOUNTER — Encounter (HOSPITAL_COMMUNITY): Payer: Self-pay | Admitting: Orthopedic Surgery

## 2021-09-24 ENCOUNTER — Other Ambulatory Visit: Payer: Self-pay

## 2021-09-24 NOTE — Progress Notes (Signed)
[name] denies chest pain or shortness of breath. Patient denies having any s/s of Covid in his household.  Patient denies any known exposure to Covid.  ? ?[name]'s DRs: ?PCP :Augustin Coupe, FNP ?Cardiologist: Dr. Adora Fridge. ? ?[name] has type II diabetes. Last A1C was 10.4, drawn on 08/14/21.  [name] checks CBG 2 times a day, patient reports that CBGs run 199- 200.  I instructed patient to take 42 units of NPH tonight, in the am, if CBG is > 70, take 30 units of NPH Insulin.   ?I instructed [name] to check CBG after awaking and every 2 hours until arrival  to the hospital.  ?I Instructed patient if CBG is less than 70 to take 4 Glucose Tablets or 1 tube of Glucose Gel or 1/2 cup of a clear juice. Recheck CBG in 15 minutes if CBG is not over 70 call, pre- op desk at [phone] for further instructions. If scheduled to receive Insulin, do not take Insulin. ?  ?I instructed [name]   to shower with antibacteria soap.  DO not shave. No nail polish, artificial or acrylic nails. Wear clean clothes, brush your teeth. ?Glasses, contact lens,dentures or partials may not be worn in the OR. If you need to wear them, please bring a case for glasses, do not wear contacts or bring a case, the hospital does not have contact cases, dentures or partials will have to be removed , make sure they are clean, we will provide a denture cup to put them in. You will need some one to drive you home and a responsible person over the age of 32 to stay with you for the first 24 hours after surgery.  ?

## 2021-09-25 ENCOUNTER — Other Ambulatory Visit: Payer: Self-pay

## 2021-09-25 ENCOUNTER — Ambulatory Visit (HOSPITAL_COMMUNITY): Payer: 59 | Admitting: Anesthesiology

## 2021-09-25 ENCOUNTER — Encounter (HOSPITAL_COMMUNITY): Payer: Self-pay | Admitting: Orthopedic Surgery

## 2021-09-25 ENCOUNTER — Ambulatory Visit (HOSPITAL_BASED_OUTPATIENT_CLINIC_OR_DEPARTMENT_OTHER): Payer: 59 | Admitting: Anesthesiology

## 2021-09-25 ENCOUNTER — Encounter (HOSPITAL_COMMUNITY)
Admission: RE | Disposition: A | Discharge status: Home / Self Care | Payer: Self-pay | Source: Ambulatory Visit | Attending: Orthopedic Surgery

## 2021-09-25 ENCOUNTER — Ambulatory Visit (HOSPITAL_COMMUNITY)
Admission: RE | Admit: 2021-09-25 | Discharge: 2021-09-25 | Disposition: A | Discharge status: Home / Self Care | Payer: 59 | Source: Ambulatory Visit | Attending: Orthopedic Surgery | Admitting: Orthopedic Surgery

## 2021-09-25 DIAGNOSIS — N186 End stage renal disease: Secondary | ICD-10-CM | POA: Insufficient documentation

## 2021-09-25 DIAGNOSIS — Z6833 Body mass index (BMI) 33.0-33.9, adult: Secondary | ICD-10-CM | POA: Diagnosis not present

## 2021-09-25 DIAGNOSIS — Z940 Kidney transplant status: Secondary | ICD-10-CM | POA: Insufficient documentation

## 2021-09-25 DIAGNOSIS — I120 Hypertensive chronic kidney disease with stage 5 chronic kidney disease or end stage renal disease: Secondary | ICD-10-CM | POA: Insufficient documentation

## 2021-09-25 DIAGNOSIS — E43 Unspecified severe protein-calorie malnutrition: Secondary | ICD-10-CM | POA: Diagnosis not present

## 2021-09-25 DIAGNOSIS — L97529 Non-pressure chronic ulcer of other part of left foot with unspecified severity: Secondary | ICD-10-CM | POA: Diagnosis not present

## 2021-09-25 DIAGNOSIS — E1142 Type 2 diabetes mellitus with diabetic polyneuropathy: Secondary | ICD-10-CM | POA: Diagnosis not present

## 2021-09-25 DIAGNOSIS — Z794 Long term (current) use of insulin: Secondary | ICD-10-CM | POA: Diagnosis not present

## 2021-09-25 DIAGNOSIS — E11621 Type 2 diabetes mellitus with foot ulcer: Secondary | ICD-10-CM | POA: Diagnosis not present

## 2021-09-25 DIAGNOSIS — E1152 Type 2 diabetes mellitus with diabetic peripheral angiopathy with gangrene: Secondary | ICD-10-CM | POA: Insufficient documentation

## 2021-09-25 DIAGNOSIS — K219 Gastro-esophageal reflux disease without esophagitis: Secondary | ICD-10-CM | POA: Insufficient documentation

## 2021-09-25 DIAGNOSIS — L97519 Non-pressure chronic ulcer of other part of right foot with unspecified severity: Secondary | ICD-10-CM | POA: Diagnosis not present

## 2021-09-25 DIAGNOSIS — E1122 Type 2 diabetes mellitus with diabetic chronic kidney disease: Secondary | ICD-10-CM

## 2021-09-25 DIAGNOSIS — I70263 Atherosclerosis of native arteries of extremities with gangrene, bilateral legs: Secondary | ICD-10-CM | POA: Insufficient documentation

## 2021-09-25 DIAGNOSIS — L02611 Cutaneous abscess of right foot: Secondary | ICD-10-CM

## 2021-09-25 HISTORY — PX: AMPUTATION: SHX166

## 2021-09-25 HISTORY — DX: Peripheral vascular disease, unspecified: I73.9

## 2021-09-25 HISTORY — DX: End stage renal disease: N18.6

## 2021-09-25 LAB — GLUCOSE, CAPILLARY
Glucose-Capillary: 91 mg/dL (ref 70–99)
Glucose-Capillary: 88 mg/dL (ref 70–99)
Glucose-Capillary: 115 mg/dL — ABNORMAL HIGH (ref 70–99)

## 2021-09-25 SURGERY — AMPUTATION, FOOT, RAY
Anesthesia: Regional | Laterality: Right

## 2021-09-25 MED ORDER — FENTANYL CITRATE (PF) 250 MCG/5ML IJ SOLN
INTRAMUSCULAR | Status: DC | PRN
  Administered 2021-09-25: 25 ug via INTRAVENOUS

## 2021-09-25 MED ORDER — MIDAZOLAM HCL 2 MG/2ML IJ SOLN: 2.0000 mg | Freq: Once | INTRAMUSCULAR | Status: AC

## 2021-09-25 MED ORDER — BUPIVACAINE-EPINEPHRINE (PF) 0.5% -1:200000 IJ SOLN
INTRAMUSCULAR | Status: DC | PRN
  Administered 2021-09-25: 30 mL via PERINEURAL

## 2021-09-25 MED ORDER — ONDANSETRON HCL 4 MG/2ML IJ SOLN
INTRAMUSCULAR | Status: DC | PRN
  Administered 2021-09-25: 4 mg via INTRAVENOUS

## 2021-09-25 MED ORDER — ACETAMINOPHEN 10 MG/ML IV SOLN: 1000.0000 mg | Freq: Once | INTRAVENOUS | Status: DC | PRN

## 2021-09-25 MED ORDER — CEFAZOLIN SODIUM-DEXTROSE 2-4 GM/100ML-% IV SOLN
INTRAVENOUS | Status: AC
  Filled 2021-09-25: qty 100

## 2021-09-25 MED ORDER — LACTATED RINGERS IV SOLN: INTRAVENOUS | Status: DC

## 2021-09-25 MED ORDER — PROPOFOL 10 MG/ML IV BOLUS
INTRAVENOUS | Status: DC | PRN
  Administered 2021-09-25: 20 mg via INTRAVENOUS

## 2021-09-25 MED ORDER — CHLORHEXIDINE GLUCONATE 0.12 % MT SOLN
OROMUCOSAL | Status: AC
  Administered 2021-09-25: 15 mL via OROMUCOSAL
  Filled 2021-09-25: qty 15

## 2021-09-25 MED ORDER — PROPOFOL 500 MG/50ML IV EMUL
INTRAVENOUS | Status: DC | PRN
  Administered 2021-09-25: 100 ug/kg/min via INTRAVENOUS

## 2021-09-25 MED ORDER — LIDOCAINE 2% (20 MG/ML) 5 ML SYRINGE
INTRAMUSCULAR | Status: DC | PRN
Start: 2021-09-25 — End: 2021-09-25
  Administered 2021-09-25: 40 mg via INTRAVENOUS

## 2021-09-25 MED ORDER — PROPOFOL 10 MG/ML IV BOLUS
INTRAVENOUS | Status: AC
  Filled 2021-09-25: qty 20

## 2021-09-25 MED ORDER — FENTANYL CITRATE (PF) 100 MCG/2ML IJ SOLN: 50.0000 ug | Freq: Once | INTRAMUSCULAR | Status: AC

## 2021-09-25 MED ORDER — FENTANYL CITRATE (PF) 250 MCG/5ML IJ SOLN
INTRAMUSCULAR | Status: AC
  Filled 2021-09-25: qty 5

## 2021-09-25 MED ORDER — OXYCODONE-ACETAMINOPHEN 5-325 MG PO TABS: 1.0000 | ORAL_TABLET | ORAL | 0 refills | Status: DC | PRN

## 2021-09-25 MED ORDER — CHLORHEXIDINE GLUCONATE 0.12 % MT SOLN: 15.0000 mL | Freq: Once | OROMUCOSAL | Status: AC

## 2021-09-25 MED ORDER — MIDAZOLAM HCL 2 MG/2ML IJ SOLN
INTRAMUSCULAR | Status: AC
  Administered 2021-09-25: 2 mg via INTRAVENOUS
  Filled 2021-09-25: qty 2

## 2021-09-25 MED ORDER — ORAL CARE MOUTH RINSE: 15.0000 mL | Freq: Once | OROMUCOSAL | Status: AC

## 2021-09-25 MED ORDER — CEFAZOLIN SODIUM-DEXTROSE 2-4 GM/100ML-% IV SOLN
2.0000 g | INTRAVENOUS | Status: AC
  Administered 2021-09-25: 2 g via INTRAVENOUS

## 2021-09-25 MED ORDER — FENTANYL CITRATE (PF) 100 MCG/2ML IJ SOLN: 25.0000 ug | INTRAMUSCULAR | Status: DC | PRN

## 2021-09-25 MED ORDER — PHENYLEPHRINE 80 MCG/ML (10ML) SYRINGE FOR IV PUSH (FOR BLOOD PRESSURE SUPPORT)
PREFILLED_SYRINGE | INTRAVENOUS | Status: DC | PRN
  Administered 2021-09-25 (×5): 80 ug via INTRAVENOUS

## 2021-09-25 MED ORDER — FENTANYL CITRATE (PF) 100 MCG/2ML IJ SOLN
INTRAMUSCULAR | Status: AC
  Administered 2021-09-25: 50 ug via INTRAVENOUS
  Filled 2021-09-25: qty 2

## 2021-09-25 SURGICAL SUPPLY — 34 items
BAG COUNTER SPONGE SURGICOUNT (BAG) ×2 IMPLANT
BLADE SAW SGTL MED 73X18.5 STR (BLADE) IMPLANT
BLADE SURG 21 STRL SS (BLADE) ×2 IMPLANT
BNDG COHESIVE 1X5 TAN STRL LF (GAUZE/BANDAGES/DRESSINGS) ×1 IMPLANT
BNDG COHESIVE 4X5 TAN ST LF (GAUZE/BANDAGES/DRESSINGS) ×1 IMPLANT
BNDG COHESIVE 4X5 TAN STRL (GAUZE/BANDAGES/DRESSINGS) ×2 IMPLANT
BNDG GAUZE ELAST 4 BULKY (GAUZE/BANDAGES/DRESSINGS) ×2 IMPLANT
COVER SURGICAL LIGHT HANDLE (MISCELLANEOUS) ×4 IMPLANT
DRAPE DERMATAC (DRAPES) ×1 IMPLANT
DRAPE U-SHAPE 47X51 STRL (DRAPES) ×4 IMPLANT
DRESSING PEEL AND PLC PRVNA 13 (GAUZE/BANDAGES/DRESSINGS) IMPLANT
DRSG ADAPTIC 3X8 NADH LF (GAUZE/BANDAGES/DRESSINGS) ×2 IMPLANT
DRSG PAD ABDOMINAL 8X10 ST (GAUZE/BANDAGES/DRESSINGS) ×4 IMPLANT
DRSG PEEL AND PLACE PREVENA 13 (GAUZE/BANDAGES/DRESSINGS) ×2
DURAPREP 26ML APPLICATOR (WOUND CARE) ×2 IMPLANT
ELECT REM PT RETURN 9FT ADLT (ELECTROSURGICAL) ×2
ELECTRODE REM PT RTRN 9FT ADLT (ELECTROSURGICAL) ×1 IMPLANT
GAUZE SPONGE 4X4 12PLY STRL (GAUZE/BANDAGES/DRESSINGS) ×2 IMPLANT
GLOVE BIOGEL PI IND STRL 9 (GLOVE) ×1 IMPLANT
GLOVE BIOGEL PI INDICATOR 9 (GLOVE) ×1
GLOVE SURG ORTHO 9.0 STRL STRW (GLOVE) ×2 IMPLANT
GOWN STRL REUS W/ TWL XL LVL3 (GOWN DISPOSABLE) ×2 IMPLANT
GOWN STRL REUS W/TWL XL LVL3 (GOWN DISPOSABLE) ×2
GRAFT SKIN WND MICRO 38 (Tissue) ×1 IMPLANT
KIT BASIN OR (CUSTOM PROCEDURE TRAY) ×2 IMPLANT
KIT TURNOVER KIT B (KITS) ×2 IMPLANT
NS IRRIG 1000ML POUR BTL (IV SOLUTION) ×2 IMPLANT
PACK ORTHO EXTREMITY (CUSTOM PROCEDURE TRAY) ×2 IMPLANT
PAD ARMBOARD 7.5X6 YLW CONV (MISCELLANEOUS) ×4 IMPLANT
STOCKINETTE IMPERVIOUS LG (DRAPES) IMPLANT
SUT ETHILON 2 0 PSLX (SUTURE) ×2 IMPLANT
TOWEL GREEN STERILE (TOWEL DISPOSABLE) ×2 IMPLANT
TUBE CONNECTING 12X1/4 (SUCTIONS) ×2 IMPLANT
YANKAUER SUCT BULB TIP NO VENT (SUCTIONS) ×2 IMPLANT

## 2021-09-25 NOTE — Interval H&P Note (Signed)
History and Physical Interval Note: ? ?09/25/2021 ?4:06 PM ? ?Raymond [name]  has presented today for surgery, with the diagnosis of gangrene toes right foot.  The various methods of treatment have been discussed with the patient and family. After consideration of risks, benefits and other options for treatment, the patient has consented to  Procedure(s): ?RIGHT TRANSMETATARSAL AMPUTATION AND APPLY TISSUE GRAFT (Right) as a surgical intervention.  The patient's history has been reviewed, patient examined, no change in status, stable for surgery.  I have reviewed the patient's chart and labs.  Questions were answered to the patient's satisfaction.   ? ? ?Newt Minion ? ? ?

## 2021-09-25 NOTE — Anesthesia Procedure Notes (Signed)
Anesthesia Regional Block: Popliteal block  ? ?Pre-Anesthetic Checklist: , timeout performed,  Correct Patient, Correct Site, Correct Laterality,  Correct Procedure, Correct Position, site marked,  Risks and benefits discussed,  Surgical consent,  Pre-op evaluation,  At surgeon's request and post-op pain management ? ?Laterality: Right ? ?Prep: chloraprep     ?  ?Needles:  ?Injection technique: Single-shot ? ?Needle Type: Echogenic Stimulator Needle   ? ? ?Needle Length: 9cm  ?Needle Gauge: 21  ? ? ? ?Additional Needles: ? ? ?Procedures:,,,, ultrasound used (permanent image in chart),,    ?Narrative:  ?Start time: 09/25/2021 1:30 PM ?End time: 09/25/2021 1:35 PM ?Injection made incrementally with aspirations every 5 mL. ? ?Performed by: Personally  ?Anesthesiologist: Effie Berkshire, MD ? ?Additional Notes: ?Patient tolerated the procedure well. Local anesthetic introduced in an incremental fashion under minimal resistance after negative aspirations. No paresthesias were elicited. After completion of the procedure, no acute issues were identified and patient continued to be monitored by RN.  ? ? ? ? ? ?

## 2021-09-25 NOTE — Progress Notes (Signed)
Patient alert talking to family on phone awaiting son to arrive to transport home ?

## 2021-09-25 NOTE — H&P (Signed)
Raymond [name] is an 61 y.o. male.   ?Chief Complaint: Painful gangrene toes of the right foot. ?HPI: Raymond [name] is a 61 year old gentleman who is seen in consultation for gangrene of the toes of the right foot with a plantar ulcer.  Raymond [name] is status post endovascular reconstruction on April 19.  Prior to surgery his ankle-brachial indices were monophasic and noncompressible. ? ?Past Medical History:  ?Diagnosis Date  ? Diabetes ([place])   ? Diabetes mellitus without complication ([place])   ? ESRD (end stage renal disease) ([place])   ? 03/09/2015- Raymond [name] had a kidney transplant  ? Hypertension   ? Peripheral vascular disease ([place])   ? Renal disorder   ? Renal insufficiency   ? ? ?Past Surgical History:  ?Procedure Laterality Date  ? ABDOMINAL AORTOGRAM W/LOWER EXTREMITY N/A 09/11/2021  ? Procedure: ABDOMINAL AORTOGRAM W/LOWER EXTREMITY;  Surgeon: Wellington Hampshire, MD;  Location: [facility];  Service: Cardiovascular;  Laterality: N/A;  ? BONE BIOPSY Right 03/06/2021  ? Procedure: BONE BIOPSY;  Surgeon: Trula Slade, DPM;  Location: [facility];  Service: Podiatry;  Laterality: Right;  ? CENTRAL VENOUS CATHETER INSERTION Right 01/20/2020  ? Procedure: INSERTION CENTRAL LINE ADULT; Tunneled central line;  Surgeon: Virl Cagey, MD;  Location: [facility];  Service: General;  Laterality: Right;  ? COLONOSCOPY N/A 12/05/2014  ? NFA:OZHYQMVH external and internal hemorrhoid/mild diverticulosis/11 polyps removed  ? ESOPHAGOGASTRODUODENOSCOPY N/A 12/05/2014  ? QIO:NGEX duodenitis  ? GRAFT APPLICATION Right 52/84/1324  ? Procedure: GRAFT APPLICATION;  Surgeon: Trula Slade, DPM;  Location: [facility];  Service: Podiatry;  Laterality: Right;  ? INCISION AND DRAINAGE OF WOUND Right 08/15/2021  ? Procedure: IRRIGATION AND DEBRIDEMENT WOUND;  Surgeon: Landis Martins, DPM;  Location: [facility];  Service: Podiatry;  Laterality: Right;  need to make card not  a irrigation and debridement card  ? IR FLUORO GUIDE CV LINE  RIGHT  03/01/2019  ? IR PERC TUN PERIT CATH WO PORT S&I /IMAG  06/27/2021  ? IR REMOVAL TUN CV CATH W/O FL  05/10/2019  ? IR REMOVAL TUN CV CATH W/O FL  02/29/2020  ? IR REMOVAL TUN CV CATH W/O FL  07/19/2021  ? IR US GUIDE VASC ACCESS RIGHT  03/01/2019  ? IR US GUIDE VASC ACCESS RIGHT  06/27/2021  ? IRRIGATION AND DEBRIDEMENT ELBOW Left 06/24/2021  ? Procedure: IRRIGATION AND DEBRIDEMENT ELBOW;  Surgeon: Lovell Sheehan, MD;  Location: [facility];  Service: Orthopedics;  Laterality: Left;  ? KIDNEY TRANSPLANT  03/27/2015  ? Penile Pump Insertion    ? PERIPHERAL VASCULAR ATHERECTOMY  09/11/2021  ? Procedure: PERIPHERAL VASCULAR ATHERECTOMY;  Surgeon: Wellington Hampshire, MD;  Location: [facility];  Service: Cardiovascular;;  Shockwave Lithotripsy  ? TEE WITHOUT CARDIOVERSION N/A 01/17/2020  ? Procedure: TRANSESOPHAGEAL ECHOCARDIOGRAM (TEE) WITH PROPOFOL;  Surgeon: Arnoldo Lenis, MD;  [facility];  Service: Endoscopy;  Laterality: N/A;  ? WOUND DEBRIDEMENT Right 03/06/2021  ? Procedure: DEBRIDEMENT WOUND;  Surgeon: Trula Slade, DPM;  Location: [facility];  Service: Podiatry;  Laterality: Right;  ? ? ?Family History  ?Problem Relation Age of Onset  ? Diabetes Mother   ? Heart disease Father   ? Colon cancer Neg Hx   ? ?Social History:  reports that Raymond [name] has never smoked. Raymond [name] has never used smokeless tobacco. Raymond [name] reports that Raymond [name] does not drink alcohol and does not use drugs. ? ?Allergies: No Known Allergies ? ?No medications prior to  admission.  ? ? ?No results found for this or any previous visit (from the past 48 hour(s)). ?No results found. ? ?Review of Systems  ?All other systems reviewed and are negative. ? ?There were no vitals taken for this visit. ?Physical Exam  ?Raymond [name] is alert, oriented, no adenopathy, well-dressed, normal affect, normal respiratory effort. ?Examination Raymond [name] does not have palpable pulses the Doppler was used and Raymond [name] has a good biphasic dorsalis pedis and posterior tibial  pulse status post revascularization.  Raymond [name] does have some venous stasis swelling but no venous ulcers.  There is necrotic ulceration to the third fourth and fifth toes.  His albumin is 2.1 hemoglobin A1c 10.4.  Last year his hemoglobin A1c was greater than 15. ? ?Assessment/Plan   ? ? ?1. PVD (peripheral vascular disease) ([place])   ?2. Gangrene of toe of right foot ([place])   ?3. Severe protein-calorie malnutrition ([place])   ?4. Diabetic polyneuropathy associated with type 2 diabetes mellitus ([place])   ?  ?  ?Plan: We will proceed with a transmetatarsal amputation.  Discussed risks and benefits of surgery discussed risks of a higher level amputation difficulty healing.  Raymond [name] states Raymond [name] understands wished to proceed at this time recommended protein powder supplements twice a day. ? ?Newt Minion, MD ?09/25/2021, 6:51 AM ? ? ? ?

## 2021-09-25 NOTE — Op Note (Signed)
09/25/2021 ? ?4:04 PM ? ?PATIENT:  Raymond [name]   ? ?PRE-OPERATIVE DIAGNOSIS:  gangrene toes right foot ? ?POST-OPERATIVE DIAGNOSIS:  Same ? ?PROCEDURE:  RIGHT TRANSMETATARSAL AMPUTATION AND APPLY KERECIS TISSUE GRAFT, application of 13 cm Prevena wound VAC. ? ?SURGEON:  Newt Minion, MD ? ?PHYSICIAN ASSISTANT:None ?ANESTHESIA:   General ? ?PREOPERATIVE INDICATIONS:  Raymond [name] is a  61 y.o. male with a diagnosis of gangrene toes right foot who failed conservative measures and elected for surgical management.   ? ?The risks benefits and alternatives were discussed with the patient preoperatively including but not limited to the risks of infection, bleeding, nerve injury, cardiopulmonary complications, the need for revision surgery, among others, and the patient was willing to proceed. ? ?OPERATIVE IMPLANTS: Kerecis 38 cm?. ? ?'@ENCIMAGES'$ @ ? ?OPERATIVE FINDINGS: Good petechial bleeding tissue on the margins that was resected was sent for cultures. ? ?OPERATIVE PROCEDURE: Patient was brought the operating room underwent general anesthetic.  After adequate levels anesthesia were obtained patient's right lower extremity was prepped using DuraPrep draped into a sterile field a timeout was called.  A fishmouth incision was made just proximal to the necrotic tissue.  A transmetatarsal amputation was performed with a oscillating saw.  Electrocautery was used for hemostasis.  The wound was irrigated with normal saline.  The wound was filled with 38 cm? of Kerecis tissue graft.  The wound was then closed a Prevena 13 cm wound VAC was applied this had a good suction fit patient was taken the PACU in stable condition. ? ? ?DISCHARGE PLANNING: ? ?Antibiotic duration: Preoperative antibiotics ? ?Weightbearing: Touchdown weightbearing on the right ? ?Pain medication: Prescription for Percocet ? ?Dressing care/ Wound VAC: Wound VAC continue for 1 week ? ?Ambulatory devices: Crutches ? ?Discharge to:  Home. ? ?Follow-up: In the office 1 week post operative. ? ? ? ? ? ? ?  ?

## 2021-09-25 NOTE — Anesthesia Preprocedure Evaluation (Addendum)
Anesthesia Evaluation  ?Patient identified by MRN, date of birth, ID band ?Patient awake ? ? ? ?Reviewed: ?Allergy & Precautions, NPO status , Patient's Chart, lab work & pertinent test results ? ?Airway ?Mallampati: II ? ?TM Distance: >3 FB ?Neck ROM: Full ? ? ? Dental ? ?(+) Poor Dentition, Missing ?  ?Pulmonary ?neg pulmonary ROS,  ?  ?Pulmonary exam normal ? ? ? ? ? ? ? Cardiovascular ?hypertension, + Peripheral Vascular Disease  ? ?Rhythm:Regular Rate:Normal ? ? ?  ?Neuro/Psych ?negative neurological ROS ? negative psych ROS  ? GI/Hepatic ?Neg liver ROS, GERD  ,  ?Endo/Other  ?diabetes, Type 2, Insulin Dependent ? Renal/GU ?ESRFRenal disease (s/p transplant)  ?negative genitourinary ?  ?Musculoskeletal ? ?(+) Arthritis , Osteoarthritis,   ? Abdominal ?Normal abdominal exam  (+)   ?Peds ? Hematology ? ?(+) Blood dyscrasia, anemia ,   ?Anesthesia Other Findings ? ? Reproductive/Obstetrics ? ?  ? ? ? ? ? ? ? ? ? ? ? ? ? ?  ?  ? ? ? ? ? ? ? ?Anesthesia Physical ?Anesthesia Plan ? ?ASA: 3 ? ?Anesthesia Plan: General and Regional  ? ?Post-op Pain Management: Regional block*  ? ?Induction: Intravenous ? ?PONV Risk Score and Plan: 2 and Ondansetron, Dexamethasone, Midazolam and Treatment may vary due to age or medical condition ? ?Airway Management Planned: Mask and LMA ? ?Additional Equipment: None ? ?Intra-op Plan:  ? ?Post-operative Plan: Extubation in OR ? ?Informed Consent: I have reviewed the patients History and Physical, chart, labs and discussed the procedure including the risks, benefits and alternatives for the proposed anesthesia with the patient or authorized representative who has indicated his/her understanding and acceptance.  ? ? ? ?Dental advisory given ? ?Plan Discussed with: CRNA ? ?Anesthesia Plan Comments: (Lab Results ?     Component                Value               Date                 ?     WBC                      5.0                 08/30/2021           ?      HGB                      9.8 (L)             08/30/2021           ?     HCT                      32.9 (L)            08/30/2021           ?     MCV                      82.9                08/30/2021           ?     PLT  359                 08/30/2021           ?Lab Results ?     Component                Value               Date                 ?     NA                       138                 09/09/2021           ?     K                        5.2                 09/09/2021           ?     CO2                      24                  09/09/2021           ?     GLUCOSE                  295 (H)             09/09/2021           ?     BUN                      22                  09/09/2021           ?     CREATININE               1.03                09/09/2021           ?     CALCIUM                  9.2                 09/09/2021           ?     EGFR                     83                  09/09/2021           ?     GFRNONAA                 54 (L)              08/30/2021           ?ECHO 08/21: ??1. Left ventricular ejection fraction, by estimation, is 55 to 60%. The  ?left ventricle has normal function. The left ventricle has no regional  ?wall motion abnormalities.  ??2. Right ventricular systolic function is normal. The right ventricular  ?size  is normal.  ??3. Left atrial size was mildly dilated. No left atrial/left atrial  ?appendage thrombus was detected. The LAA emptying velocity was 90 cm/s.  ??4. Right atrial size was mildly dilated.  ??5. The mitral valve is normal in structure. Trivial mitral valve  ?regurgitation. No evidence of mitral stenosis.  ??6. The aortic valve is tricuspid. Aortic valve regurgitation is not  ?visualized. No aortic stenosis is present.  ? ?Conclusion(s)/Recommendation(s): No evidence of vegetation/infective  ?endocarditis on this transesophageal  ?echocardiogram. )  ? ? ? ? ? ? ?Anesthesia Quick Evaluation ? ?

## 2021-09-25 NOTE — Progress Notes (Signed)
Orthopedic Tech Progress Note ?Patient Details:  ?Raymond [name] ?Mar 10, 1961 ?[id number] ? ?PACU RN called requesting a POST OP SHOE and CRUTCHES for patient  ? ?Ortho Devices ?Type of Ortho Device: Crutches, Postop shoe/boot ?Ortho Device/Splint Location: RLE ?Ortho Device/Splint Interventions: Ordered, Application, Adjustment ?  ?Post Interventions ?Patient Tolerated: Well, Ambulated well ?Instructions Provided: Care of device, Poper ambulation with device ? ?Janit Pagan ?09/25/2021, 5:16 PM ? ?

## 2021-09-25 NOTE — Transfer of Care (Signed)
Immediate Anesthesia Transfer of Care Note ? ?Patient: Raymond [name] ? ?Procedure(s) Performed: RIGHT TRANSMETATARSAL AMPUTATION AND APPLY TISSUE GRAFT (Right) ? ?Patient Location: PACU ? ?Anesthesia Type:MAC and Regional ? ?Level of Consciousness: awake and drowsy ? ?Airway & Oxygen Therapy: Patient Spontanous Breathing ? ?Post-op Assessment: Report given to RN and Post -op Vital signs reviewed and stable ? ?Post vital signs: Reviewed and stable ? ?Last Vitals:  ?Vitals Value Taken Time  ?BP 130/80 09/25/21 1600  ?Temp    ?Pulse 75 09/25/21 1600  ?Resp 14 09/25/21 1600  ?SpO2 96 % 09/25/21 1600  ?Vitals shown include unvalidated device data. ? ?Last Pain:  ?Vitals:  ? 09/25/21 1408  ?TempSrc:   ?PainSc: 0-No pain  ?   ? ?Patients Stated Pain Goal: 0 (09/25/21 1217) ? ?Complications: No notable events documented. ?

## 2021-09-25 NOTE — Anesthesia Procedure Notes (Signed)
Procedure Name: [place] ?Date/Time: 09/25/2021 3:10 PM ?Performed by: Dorann Lodge, CRNA ?Pre-anesthesia Checklist: Patient identified, Emergency Drugs available, Suction available and Patient being monitored ?Patient Re-evaluated:Patient Re-evaluated prior to induction ?Oxygen Delivery Method: Simple face mask ? ? ? ? ?

## 2021-09-26 ENCOUNTER — Telehealth: Payer: Self-pay | Admitting: Sports Medicine

## 2021-09-26 ENCOUNTER — Ambulatory Visit: Payer: Self-pay | Admitting: Sports Medicine

## 2021-09-26 ENCOUNTER — Encounter (HOSPITAL_COMMUNITY): Payer: Self-pay | Admitting: Orthopedic Surgery

## 2021-09-26 NOTE — Telephone Encounter (Signed)
Called patient and told him to re-enforce his surgical dressing. If bleeding continues to call Dr. Jess Barters office for further recommendations. Patient also asked about his FMLA. I will have Levada Dy call him regarding this to extend it by 8 more weeks to give him more time to recover from his right foot surgery that he had on yesterday with Dr. Sharol Given. ?-Dr. Cannon Kettle ?

## 2021-09-26 NOTE — Telephone Encounter (Signed)
Patient missed his appointment this morning because he just had surgery yesterday , he would like to speak with Dr Cannon Kettle.  He is having a little blood leakage today.

## 2021-09-26 NOTE — Anesthesia Postprocedure Evaluation (Signed)
Anesthesia Post Note ? ?Patient: Raymond [name] ? ?Procedure(s) Performed: RIGHT TRANSMETATARSAL AMPUTATION AND APPLY TISSUE GRAFT (Right) ? ?  ? ?Patient location during evaluation: PACU ?Anesthesia Type: Regional ?Level of consciousness: awake and alert ?Pain management: pain level controlled ?Vital Signs Assessment: post-procedure vital signs reviewed and stable ?Respiratory status: spontaneous breathing, nonlabored ventilation, respiratory function stable and patient connected to nasal cannula oxygen ?Cardiovascular status: stable and blood pressure returned to baseline ?Postop Assessment: no apparent nausea or vomiting ?Anesthetic complications: no ? ? ?No notable events documented. ? ?Last Vitals:  ?Vitals:  ? 09/25/21 1615 09/25/21 1630  ?BP: (!) 160/82 (!) 162/83  ?Pulse: 68 66  ?Resp: 18 14  ?Temp:  36.8 ?C  ?SpO2: 97% 98%  ?  ?Last Pain:  ?Vitals:  ? 09/25/21 1630  ?TempSrc:   ?PainSc: 0-No pain  ? ? ?  ?  ?  ?  ?  ?  ? ?Effie Berkshire ? ? ? ? ?

## 2021-09-26 NOTE — Telephone Encounter (Signed)
Called patient to see how he was doing after foot surgery with Dr. Sharol Given. Patient is not answer the phone. I advised patient over the voicemail that as long as he has follow up with Dr. Sharol Given after surgery he can cancel all of his appointments with me. Call back phone # provided to the patient in case he has any other questions or concerns. ?-Dr. Cannon Kettle ?

## 2021-09-30 ENCOUNTER — Telehealth: Payer: Self-pay | Admitting: Orthopedic Surgery

## 2021-09-30 ENCOUNTER — Ambulatory Visit (INDEPENDENT_AMBULATORY_CARE_PROVIDER_SITE_OTHER): Payer: 59 | Admitting: Orthopedic Surgery

## 2021-09-30 DIAGNOSIS — Z89431 Acquired absence of right foot: Secondary | ICD-10-CM

## 2021-09-30 DIAGNOSIS — I739 Peripheral vascular disease, unspecified: Secondary | ICD-10-CM

## 2021-09-30 LAB — AEROBIC/ANAEROBIC CULTURE W GRAM STAIN (SURGICAL/DEEP WOUND)

## 2021-09-30 MED ORDER — DOXYCYCLINE HYCLATE 100 MG PO TABS: 100.0000 mg | ORAL_TABLET | Freq: Two times a day (BID) | ORAL | 0 refills | Status: DC

## 2021-09-30 NOTE — Telephone Encounter (Signed)
PT called and states he was suppose to have an antibiotic called in. Also pt really wants to come in today to have his dressing changed.  ? ?DK [phone]  ?

## 2021-09-30 NOTE — Telephone Encounter (Signed)
Pt was evaluated in the office today and rx for Doxy was sent to his pharm.  ?

## 2021-10-01 ENCOUNTER — Encounter: Payer: Self-pay | Admitting: Orthopedic Surgery

## 2021-10-01 NOTE — Progress Notes (Signed)
? ?Office Visit Note ?  ?Patient: Raymond [name]           ?Date of Birth: 1960/10/30           ?MRN: [id number] ?Visit Date: 09/30/2021 ?             ?Requested by: [name], [place] ?[address] ?[address] ?[place],  [place] [address] ?PCP: [name], FNP ? ?Chief Complaint  ?Patient presents with  ? Right Foot - Routine Post Op  ?  09/25/21 right transmet amputation with kerecis graft  ? ? ? ? ?HPI: ?Patient is a 61 year old gentleman who presents 1 week status post right transmetatarsal amputation and Kerecis tissue graft.  He but has been on Bactrim DS and Cipro. ? ?Assessment & Plan: ?Visit Diagnoses:  ?1. PVD (peripheral vascular disease) ([place])   ?2. History of transmetatarsal amputation of right foot ([place])   ? ? ?Plan: We will send in a prescription for doxycycline his culture sensitivities are sensitive to the doxycycline.  Start Dial soap cleansing dry dressing changes nonweightbearing. ? ?Follow-Up Instructions: Return in about 2 weeks (around 10/14/2021).  ? ?Ortho Exam ? ?Patient is alert, oriented, no adenopathy, well-dressed, normal affect, normal respiratory effort. ?Examination there is some slight gaping of the incision with healthy granulation tissue.  There is 100 cc in the wound VAC canister.  There are no ischemic changes. ? ?Imaging: ?No results found. ? ? ?Labs: ?Lab Results  ?Component Value Date  ? HGBA1C 10.4 (H) 08/14/2021  ? HGBA1C 10.6 (H) 06/24/2021  ? HGBA1C >15 03/05/2021  ? ESRSEDRATE 88 (H) 08/14/2021  ? ESRSEDRATE 47 (H) 07/12/2021  ? ESRSEDRATE 39 (H) 07/08/2021  ? CRP 25.2 (H) 08/14/2021  ? CRP 11.6 (H) 06/27/2021  ? CRP 2.7 04/17/2021  ? REPTSTATUS 09/30/2021 FINAL 09/25/2021  ? GRAMSTAIN  09/25/2021  ?  RARE WBC PRESENT, PREDOMINANTLY PMN ?RARE GRAM POSITIVE COCCI IN PAIRS ?  ? CULT  09/25/2021  ?  FEW GROUP B STREP(S.AGALACTIAE)ISOLATED ?TESTING AGAINST S. AGALACTIAE NOT ROUTINELY PERFORMED DUE TO PREDICTABILITY OF AMP/PEN/VAN SUSCEPTIBILITY. ?FEW STAPHYLOCOCCUS  LUGDUNENSIS ?NO ANAEROBES ISOLATED ?Performed at [facility], [place] [address] [place], [place] [address] ?  ? [place] 09/25/2021  ? ? ? ?Lab Results  ?Component Value Date  ? ALBUMIN 2.3 (L) 08/30/2021  ? ALBUMIN 2.1 (L) 08/29/2021  ? ALBUMIN 3.2 (L) 07/12/2021  ? PREALBUMIN <5 (L) 08/14/2021  ? ? ?Lab Results  ?Component Value Date  ? MG 1.3 (L) 08/29/2021  ? MG 1.9 06/26/2021  ? MG 1.4 (L) 06/25/2021  ? ?Lab Results  ?Component Value Date  ? VD25OH 23.8 (L) 12/11/2020  ? ? ?Lab Results  ?Component Value Date  ? PREALBUMIN <5 (L) 08/14/2021  ? ? ?  Latest Ref Rng & Units 08/30/2021  ? 10:19 AM 08/29/2021  ?  6:56 AM 08/28/2021  ?  4:09 PM  ?CBC EXTENDED  ?WBC 4.0 - 10.5 K/uL 5.0   5.9   5.0    ?RBC 4.22 - 5.81 MIL/uL 3.97   3.73   3.87    ?Hemoglobin 13.0 - 17.0 g/dL 9.8   9.3   9.6    ?HCT 39.0 - 52.0 % 32.9   31.3   33.1    ?Platelets 150 - 400 K/uL 359   336   344    ?NEUT# 1.7 - 7.7 K/uL 3.1   3.8   2.4    ?Lymph# 0.7 - 4.0 K/uL 1.4  1.5   1.8    ? ? ? ?There is no height or weight on file to calculate BMI. ? ?Orders:  ?No orders of the defined types were placed in this encounter. ? ?Meds ordered this encounter  ?Medications  ? doxycycline (VIBRA-TABS) 100 MG tablet  ?  Sig: Take 1 tablet (100 mg total) by mouth 2 (two) times daily.  ?  Dispense:  30 tablet  ?  Refill:  0  ? ? ? Procedures: ?No procedures performed ? ?Clinical Data: ?No additional findings. ? ?ROS: ? ?All other systems negative, except as noted in the HPI. ?Review of Systems ? ?Objective: ?Vital Signs: There were no vitals taken for this visit. ? ?Specialty Comments:  ?No specialty comments available. ? ?PMFS History: ?Patient Active Problem List  ? Diagnosis Date Noted  ? Cutaneous abscess of right foot   ? Hyperkalemia 08/28/2021  ? Obesity (BMI 30-39.9) 08/28/2021  ? History of anemia due to chronic kidney disease 08/28/2021  ? Diabetic foot ulcer ([place]) 08/14/2021  ? CKD (chronic kidney disease) stage 3, GFR  30-59 ml/min (HCC) 08/14/2021  ? Hypomagnesemia 06/25/2021  ? Septic arthritis of elbow, left ([place]) 06/24/2021  ? Peripheral vascular disease ([place]) 04/24/2020  ? Pyogenic inflammation of bone (HCC)   ? Thrombophlebitis of superficial veins of right lower extremity   ? Difficult intravenous access   ? MRSA bacteremia   ? Diabetic foot infection ([place])   ? Infected wound 01/12/2020  ? Critical lower limb ischemia ([place]) 02/02/2019  ? Personal history of noncompliance with medical treatment, presenting hazards to health 10/07/2018  ? Critical limb ischemia with history of revascularization of same extremity ([place]) 07/27/2018  ? Mixed hyperlipidemia 02/19/2018  ? History of renal transplant 02/19/2018  ? Conductive hearing loss, bilateral 08/31/2017  ? Nuclear sclerotic cataract of right eye 10/07/2016  ? Pseudophakia of left eye 10/07/2016  ? Hypertension due to endocrine disorder 03/04/2016  ? 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 Renal failure 03/04/2016  ? Proliferative diabetic retinopathy with macular edema associated with type 2 diabetes mellitus ([place]) 03/04/2016  ? Immunosuppression ([place]) 12/10/2015  ? Nausea vomiting and diarrhea 12/10/2015  ? Type 2 diabetes mellitus ([place]) 12/10/2015  ? Essential hypertension, benign 12/10/2015  ? Pancytopenia ([place]) 12/10/2015  ? Encounter for screening colonoscopy 11/15/2014  ? GERD (gastroesophageal reflux disease) 11/15/2014  ? ?Past Medical History:  ?Diagnosis Date  ? Diabetes ([place])   ? Diabetes mellitus without complication ([place])   ? ESRD (end stage renal disease) ([place])   ? 03/09/2015- patient had a kidney transplant  ? Hypertension   ? Peripheral vascular disease ([place])   ? Renal disorder   ? Renal insufficiency   ?  ?Family History  ?Problem Relation Age of Onset  ? Diabetes Mother   ? Heart disease Father   ? Colon cancer Neg Hx   ?  ?Past Surgical History:  ?Procedure Laterality  Date  ? ABDOMINAL AORTOGRAM W/LOWER EXTREMITY N/A 09/11/2021  ? Procedure: ABDOMINAL AORTOGRAM W/LOWER EXTREMITY;  Surgeon: Wellington Hampshire, MD;  Location: [facility];  Service: Cardiovascular;  Laterality: N/A;  ? AMPUTATION Right 09/25/2021  ? Procedure: RIGHT TRANSMETATARSAL AMPUTATION AND APPLY TISSUE GRAFT;  Surgeon: Newt Minion, MD;  Location: [facility];  Service: Orthopedics;  Laterality: Right;  ? BONE BIOPSY Right 03/06/2021  ? Procedure: BONE BIOPSY;  Surgeon: Trula Slade, DPM;  Location: [facility];  Service: Podiatry;  Laterality: Right;  ? CENTRAL VENOUS CATHETER INSERTION Right 01/20/2020  ? Procedure: INSERTION CENTRAL LINE ADULT; Tunneled central line;  Surgeon: Virl Cagey, MD;  Location: [facility];  Service: General;  Laterality: Right;  ? COLONOSCOPY N/A 12/05/2014  ? VOH:KGOVPCHE external and internal hemorrhoid/mild diverticulosis/11 polyps removed  ? ESOPHAGOGASTRODUODENOSCOPY N/A 12/05/2014  ? KBT:CYEL duodenitis  ? GRAFT APPLICATION Right 85/90/9311  ? Procedure: GRAFT APPLICATION;  Surgeon: Trula Slade, DPM;  Location: [facility];  Service: Podiatry;  Laterality: Right;  ? INCISION AND DRAINAGE OF WOUND Right 08/15/2021  ? Procedure: IRRIGATION AND DEBRIDEMENT WOUND;  Surgeon: Landis Martins, DPM;  Location: [facility];  Service: Podiatry;  Laterality: Right;  need to make card not  a irrigation and debridement card  ? IR FLUORO GUIDE CV LINE RIGHT  03/01/2019  ? IR PERC TUN PERIT CATH WO PORT S&I /IMAG  06/27/2021  ? IR REMOVAL TUN CV CATH W/O FL  05/10/2019  ? IR REMOVAL TUN CV CATH W/O FL  02/29/2020  ? IR REMOVAL TUN CV CATH W/O FL  07/19/2021  ? IR US GUIDE VASC ACCESS RIGHT  03/01/2019  ? IR US GUIDE VASC ACCESS RIGHT  06/27/2021  ? IRRIGATION AND DEBRIDEMENT ELBOW Left 06/24/2021  ? Procedure: IRRIGATION AND DEBRIDEMENT ELBOW;  Surgeon: Lovell Sheehan, MD;  Location: [facility];  Service: Orthopedics;  Laterality: Left;  ? KIDNEY TRANSPLANT  03/27/2015  ? Penile Pump Insertion     ? PERIPHERAL VASCULAR ATHERECTOMY  09/11/2021  ? Procedure: PERIPHERAL VASCULAR ATHERECTOMY;  Surgeon: Wellington Hampshire, MD;  Location: [facility];  Service: Cardiovascular;;  [facility]  ?

## 2021-10-03 ENCOUNTER — Ambulatory Visit (INDEPENDENT_AMBULATORY_CARE_PROVIDER_SITE_OTHER): Payer: Self-pay | Admitting: Sports Medicine

## 2021-10-03 DIAGNOSIS — Z89431 Acquired absence of right foot: Secondary | ICD-10-CM

## 2021-10-03 DIAGNOSIS — L97512 Non-pressure chronic ulcer of other part of right foot with fat layer exposed: Secondary | ICD-10-CM

## 2021-10-03 DIAGNOSIS — I739 Peripheral vascular disease, unspecified: Secondary | ICD-10-CM

## 2021-10-03 DIAGNOSIS — E0843 Diabetes mellitus due to underlying condition with diabetic autonomic (poly)neuropathy: Secondary | ICD-10-CM

## 2021-10-03 NOTE — Progress Notes (Signed)
Subjective: ?Raymond [name] is a 61 y.o. male patient seen today in office for dressing change on the right foot.  Patient is status post transmetatarsal amputation performed by orthopedics, Dr. Sharol Given on 09/25/2021.  He states that he wanted to keep his appointment with me for 1 last checkup before he continues his care with Dr. Sharol Given.  Patient denies any nausea vomiting fever chills or any other constitutional symptoms at this time.  Patient has a wife who has been assisting to change the dressing and states that they have only seen bleeding.  Reports that he is taking antibiotics as prescribed by Dr. Sharol Given. ? ?Fasting blood sugar not recorded this visit. ? ?Patient Active Problem List  ? Diagnosis Date Noted  ? Cutaneous abscess of right foot   ? Hyperkalemia 08/28/2021  ? Obesity (BMI 30-39.9) 08/28/2021  ? History of anemia due to chronic kidney disease 08/28/2021  ? Diabetic foot ulcer ([place]) 08/14/2021  ? CKD (chronic kidney disease) stage 3, GFR 30-59 ml/min (HCC) 08/14/2021  ? Hypomagnesemia 06/25/2021  ? Septic arthritis of elbow, left ([place]) 06/24/2021  ? Peripheral vascular disease ([place]) 04/24/2020  ? Pyogenic inflammation of bone (HCC)   ? Thrombophlebitis of superficial veins of right lower extremity   ? Difficult intravenous access   ? MRSA bacteremia   ? Diabetic foot infection ([place])   ? Infected wound 01/12/2020  ? Critical lower limb ischemia ([place]) 02/02/2019  ? Personal history of noncompliance with medical treatment, presenting hazards to health 10/07/2018  ? Critical limb ischemia with history of revascularization of same extremity ([place]) 07/27/2018  ? Mixed hyperlipidemia 02/19/2018  ? History of renal transplant 02/19/2018  ? Conductive hearing loss, bilateral 08/31/2017  ? Nuclear sclerotic cataract of right eye 10/07/2016  ? Pseudophakia of left eye 10/07/2016  ? Hypertension due to endocrine disorder 03/04/2016  ? 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 Renal failure 03/04/2016  ? Proliferative diabetic retinopathy with macular edema associated with type 2 diabetes mellitus ([place]) 03/04/2016  ? Immunosuppression ([place]) 12/10/2015  ? Nausea vomiting and diarrhea 12/10/2015  ? Type 2 diabetes mellitus ([place]) 12/10/2015  ? Essential hypertension, benign 12/10/2015  ? Pancytopenia ([place]) 12/10/2015  ? Encounter for screening colonoscopy 11/15/2014  ? GERD (gastroesophageal reflux disease) 11/15/2014  ? ? ?Current Outpatient Medications on File Prior to Visit  ?Medication Sig Dispense Refill  ? aspirin EC 81 MG tablet Take 1 tablet (81 mg total) by mouth daily. Swallow whole. 150 tablet 0  ? atorvastatin (LIPITOR) 10 MG tablet Take 10 mg by mouth daily.    ? blood glucose meter kit and supplies KIT Dispense based on patient and insurance preference. Use up to four times daily as directed. (FOR ICD-9 250.00, 250.01). 1 each 0  ? Blood Glucose Monitoring Suppl (ACCU-CHEK GUIDE ME) w/Device KIT 1 Piece by Does not apply route as directed. 1 kit 0  ? brimonidine (ALPHAGAN) 0.2 % ophthalmic solution Place 1 drop into both eyes 3 (three) times daily.    ? cloNIDine (CATAPRES - DOSED IN MG/24 HR) 0.3 mg/24hr patch Place 1 patch (0.3 mg total) onto the skin once a week. 4 patch 12  ? clopidogrel (PLAVIX) 75 MG tablet Take 1 tablet (75 mg total) by mouth daily. 30 tablet 6  ? dorzolamide-timolol (COSOPT) 22.3-6.8 MG/ML ophthalmic solution Place 1 drop into both eyes 2 (two) times daily.    ? doxazosin (CARDURA) 4 MG  tablet Take 4 mg by mouth daily.    ? doxycycline (VIBRA-TABS) 100 MG tablet Take 1 tablet (100 mg total) by mouth 2 (two) times daily. 30 tablet 0  ? Dulaglutide (TRULICITY) 1.70 YF/7.4BS SOPN Inject 0.75 mg into the skin once a week. (Patient taking differently: Inject 0.75 mg into the skin every Sunday.) 6 mL 3  ? glucose blood (ACCU-CHEK GUIDE) test strip Use as instructed 4 x daily. E11.65 150 each 5  ? hydrALAZINE  (APRESOLINE) 25 MG tablet Take 25 mg by mouth 2 (two) times daily.    ? Insulin NPH, Human,, Isophane, (NOVOLIN N FLEXPEN) 100 UNIT/ML Kiwkpen Inject 60 Units into the skin in the morning and at bedtime.    ? Insulin Regular Human (NOVOLIN R FLEXPEN RELION) 100 UNIT/ML KwikPen Inject 1-9 Units as directed 3 (three) times daily before meals. Sliding scale CBG 70 - 120: 0 units CBG 121 - 150: 1 unit,  CBG 151 - 200: 2 units,  CBG 201 - 250: 3 units,  CBG 251 - 300: 5 units,  CBG 301 - 350: 7 units,  CBG 351 - 400: 9 units   CBG > 400: 9 units and notify your doctor 15 mL 1  ? latanoprost (XALATAN) 0.005 % ophthalmic solution Place 1 drop into both eyes at bedtime.    ? Multiple Vitamin (MULTIVITAMIN) capsule Take 1 capsule by mouth daily.    ? mupirocin ointment (BACTROBAN) 2 % Apply 1 application. topically 2 (two) times daily. For wound care 30 g 0  ? oxyCODONE-acetaminophen (PERCOCET/ROXICET) 5-325 MG tablet Take 1 tablet by mouth every 4 (four) hours as needed. 30 tablet 0  ? sodium bicarbonate 650 MG tablet Take 1,300 mg by mouth 3 (three) times daily.    ? sodium zirconium cyclosilicate (LOKELMA) 10 g PACK packet Take 10 g by mouth daily. 30 packet 1  ? tacrolimus (PROGRAF) 1 MG capsule Take 4 capsules (4 mg total) by mouth 2 (two) times daily.    ? Vitamin D, Ergocalciferol, (DRISDOL) 50000 units CAPS capsule Take 50,000 Units by mouth every 30 (thirty) days.    ? ?Current Facility-Administered Medications on File Prior to Visit  ?Medication Dose Route Frequency Provider Last Rate Last Admin  ? sodium chloride flush (NS) 0.9 % injection 3 mL  3 mL Intravenous Q12H Wellington Hampshire, MD      ? ? ?No Known Allergies ? ?Objective: ?There were no vitals filed for this visit. ? ?General: No acute distress, AAOx3  ?Right foot: Sutures intact with no gapping or dehiscence at surgical site, minimal bleeding at the distal ends of the incision, there is a plantar wound noted at the medial foot that measures 2 x 3 x0.3 cm  with a granular base, there is mild swelling to the amputation stump and slight discoloration to the dorsal flap likely from swelling flap does have capillary fill time so there is less concern for ischemia. No pain or crepitation with range of motion right foot.  No pain with calf compression.  ? ?Assessment and Plan:  ?Problem List Items Addressed This Visit   ?None ?Visit Diagnoses   ? ? Status post transmetatarsal amputation of foot, right ([place])    -  Primary  ? Foot ulcer, right, with fat layer exposed ([place])      ? PAD (peripheral artery disease) ([place])      ? Diabetes mellitus due to underlying condition with diabetic autonomic neuropathy, unspecified whether long term insulin use ([place])      ? ?  ?  ? ?-  Patient seen and evaluated ?-Applied dry sterile dressing to surgical site right foot covered with stockinette ?-Advised patient and wife to follow-up with Dr. Sharol Given for additional postsurgical care ?-Advised patient to continue with antibiotics as prescribed by Dr. Sharol Given ?-Advised patient to limit activity to avoid increased swelling or dehiscence at amputation stump and to be compliant with staying off the foot is much as possible ?-Patient currently has a surgical shoe that he will continue to use weightbearing to the heel for transfers but must use cane or walker to help keep pressure off the stump ?-Advised patient that once he is healed Dr. Sharol Given will assist him with getting a custom insole made with a toe filler ?-Patient to follow-up as needed or sooner if problems or issues arise at this time I will defer to orthopedics to continue with postoperative care at the right foot.  Patient thanked me for taking the time to help take care of him. ? ?Landis Martins, DPM ? ?

## 2021-10-06 NOTE — Progress Notes (Signed)
Okay thank you

## 2021-10-07 ENCOUNTER — Encounter (HOSPITAL_COMMUNITY): Payer: Self-pay | Admitting: Orthopedic Surgery

## 2021-10-08 DIAGNOSIS — E113593 Type 2 diabetes mellitus with proliferative diabetic retinopathy without macular edema, bilateral: Secondary | ICD-10-CM | POA: Diagnosis not present

## 2021-10-08 DIAGNOSIS — Z794 Long term (current) use of insulin: Secondary | ICD-10-CM | POA: Diagnosis not present

## 2021-10-08 DIAGNOSIS — H40053 Ocular hypertension, bilateral: Secondary | ICD-10-CM | POA: Diagnosis not present

## 2021-10-08 DIAGNOSIS — H401131 Primary open-angle glaucoma, bilateral, mild stage: Secondary | ICD-10-CM | POA: Diagnosis not present

## 2021-10-10 ENCOUNTER — Encounter: Payer: Self-pay | Admitting: Orthopedic Surgery

## 2021-10-10 ENCOUNTER — Ambulatory Visit (INDEPENDENT_AMBULATORY_CARE_PROVIDER_SITE_OTHER): Payer: 59 | Admitting: Orthopedic Surgery

## 2021-10-10 DIAGNOSIS — Z89431 Acquired absence of right foot: Secondary | ICD-10-CM

## 2021-10-10 NOTE — Progress Notes (Signed)
Office Visit Note   Patient: Raymond [name]           Date of Birth: 1960-10-02           MRN: [id number] Visit Date: 10/10/2021              Requested by: [name], [place],  [place] [address] PCP: [name], FNP  Chief Complaint  Patient presents with   Right Foot - Routine Post Op    09/25/2021 right transmet amputation with Kerecis graft       HPI: Patient is a 61 year old gentleman who presents 2 weeks status post right transmetatarsal amputation and application of Kerecis graft he is on doxycycline Dial soap cleansing kneeling scooter.  Assessment & Plan: Visit Diagnoses:  1. History of transmetatarsal amputation of right foot ([place])     Plan: Continue current wound care Dial soap cleansing doxycycline twice a day  Follow-Up Instructions: Return in about 2 weeks (around 10/24/2021).   Ortho Exam  Patient is alert, oriented, no adenopathy, well-dressed, normal affect, normal respiratory effort. Examination the wound has slight dehiscence the wound was debrided with healthy bleeding granulation tissue.  No purulent drainage.  Imaging: No results found. No images are attached to the encounter.  Labs: Lab Results  Component Value Date   HGBA1C 10.4 (H) 08/14/2021   HGBA1C 10.6 (H) 06/24/2021   HGBA1C >15 03/05/2021   ESRSEDRATE 88 (H) 08/14/2021   ESRSEDRATE 47 (H) 07/12/2021   ESRSEDRATE 39 (H) 07/08/2021   CRP 25.2 (H) 08/14/2021   CRP 11.6 (H) 06/27/2021   CRP 2.7 04/17/2021   REPTSTATUS 09/30/2021 FINAL 09/25/2021   GRAMSTAIN  09/25/2021    RARE WBC PRESENT, PREDOMINANTLY PMN RARE GRAM POSITIVE COCCI IN PAIRS    CULT  09/25/2021    FEW GROUP B STREP(S.AGALACTIAE)ISOLATED TESTING AGAINST S. AGALACTIAE NOT ROUTINELY PERFORMED DUE TO PREDICTABILITY OF AMP/PEN/VAN SUSCEPTIBILITY. FEW STAPHYLOCOCCUS LUGDUNENSIS NO ANAEROBES ISOLATED Performed at [facility], [place] [address] [place], [place] [address]     LABORGA STAPHYLOCOCCUS LUGDUNENSIS 09/25/2021     Lab Results  Component Value Date   ALBUMIN 2.3 (L) 08/30/2021   ALBUMIN 2.1 (L) 08/29/2021   ALBUMIN 3.2 (L) 07/12/2021   PREALBUMIN <5 (L) 08/14/2021    Lab Results  Component Value Date   MG 1.3 (L) 08/29/2021   MG 1.9 06/26/2021   MG 1.4 (L) 06/25/2021   Lab Results  Component Value Date   VD25OH 23.8 (L) 12/11/2020    Lab Results  Component Value Date   PREALBUMIN <5 (L) 08/14/2021      Latest Ref Rng & Units 08/30/2021   10:19 AM 08/29/2021    6:56 AM 08/28/2021    4:09 PM  CBC EXTENDED  WBC 4.0 - 10.5 K/uL 5.0   5.9   5.0    RBC 4.22 - 5.81 MIL/uL 3.97   3.73   3.87    Hemoglobin 13.0 - 17.0 g/dL 9.8   9.3   9.6    HCT 39.0 - 52.0 % 32.9   31.3   33.1    Platelets 150 - 400 K/uL 359   336   344    NEUT# 1.7 - 7.7 K/uL 3.1   3.8   2.4    Lymph# 0.7 - 4.0 K/uL 1.4   1.5   1.8       There is no height or weight on file to calculate BMI.  Orders:  No orders of the defined types were placed in this encounter.  No orders of the defined types were placed in this encounter.    Procedures: No procedures performed  Clinical Data: No additional findings.  ROS:  All other systems negative, except as noted in the HPI. Review of Systems  Objective: Vital Signs: There were no vitals taken for this visit.  Specialty Comments:  No specialty comments available.  PMFS History: Patient Active Problem List   Diagnosis Date Noted   Cutaneous abscess of right foot    Hyperkalemia 08/28/2021   Obesity (BMI 30-39.9) 08/28/2021   History of anemia due to chronic kidney disease 08/28/2021   Diabetic foot ulcer ([place]) 08/14/2021   CKD (chronic kidney disease) stage 3, GFR 30-59 ml/min (HCC) 08/14/2021   Hypomagnesemia 06/25/2021   Septic arthritis of elbow, left ([place]) 06/24/2021   Peripheral vascular disease ([place]) 04/24/2020   Pyogenic inflammation of bone (HCC)    Thrombophlebitis of superficial veins of right  lower extremity    Difficult intravenous access    MRSA bacteremia    Diabetic foot infection ([place])    Infected wound 01/12/2020   Critical lower limb ischemia ([place]) 02/02/2019   Personal history of noncompliance with medical treatment, presenting hazards to health 10/07/2018   Critical limb ischemia with history of revascularization of same extremity ([place]) 07/27/2018   Mixed hyperlipidemia 02/19/2018   History of renal transplant 02/19/2018   Conductive hearing loss, bilateral 08/31/2017   Nuclear sclerotic cataract of right eye 10/07/2016   Pseudophakia of left eye 10/07/2016   Hypertension due to endocrine disorder 03/04/2016   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 ([place]) 03/04/2016   Renal failure 03/04/2016   Proliferative diabetic retinopathy with macular edema associated with type 2 diabetes mellitus ([place]) 03/04/2016   Immunosuppression ([place]) 12/10/2015   Nausea vomiting and diarrhea 12/10/2015   Type 2 diabetes mellitus ([place]) 12/10/2015   Essential hypertension, benign 12/10/2015   Pancytopenia ([place]) 12/10/2015   Encounter for screening colonoscopy 11/15/2014   GERD (gastroesophageal reflux disease) 11/15/2014   Past Medical History:  Diagnosis Date   Diabetes ([place])    Diabetes mellitus without complication ([place])    ESRD (end stage renal disease) ([place])    03/09/2015- patient had a kidney transplant   Hypertension    Peripheral vascular disease ([place])    Renal disorder    Renal insufficiency     Family History  Problem Relation Age of Onset   Diabetes Mother    Heart disease Father    Colon cancer Neg Hx     Past Surgical History:  Procedure Laterality Date   ABDOMINAL AORTOGRAM W/LOWER EXTREMITY N/A 09/11/2021   Procedure: ABDOMINAL AORTOGRAM W/LOWER EXTREMITY;  Surgeon: Wellington Hampshire, MD;  Location: [facility];  Service: Cardiovascular;  Laterality: N/A;   AMPUTATION Right 09/25/2021    Procedure: RIGHT TRANSMETATARSAL AMPUTATION AND APPLY TISSUE GRAFT;  Surgeon: Newt Minion, MD;  Location: [facility];  Service: Orthopedics;  Laterality: Right;   BONE BIOPSY Right 03/06/2021   Procedure: BONE BIOPSY;  Surgeon: Trula Slade, DPM;  [facility];  Service: Podiatry;  Laterality: Right;   CENTRAL VENOUS CATHETER INSERTION Right 01/20/2020   Procedure: INSERTION CENTRAL LINE ADULT; Tunneled central line;  Surgeon: Virl Cagey, MD;  [facility];  Service: General;  Laterality: Right;   COLONOSCOPY N/A 12/05/2014   CXK:[id number] external and internal hemorrhoid/mild diverticulosis/11 polyps removed  ESOPHAGOGASTRODUODENOSCOPY N/A 12/05/2014   XMI:WOEH duodenitis   GRAFT APPLICATION Right 21/22/4825   Procedure: GRAFT APPLICATION;  Surgeon: Trula Slade, DPM;  [facility];  Service: Podiatry;  Laterality: Right;   INCISION AND DRAINAGE OF WOUND Right 08/15/2021   Procedure: IRRIGATION AND DEBRIDEMENT WOUND;  Surgeon: Landis Martins, DPM;  [facility];  Service: Podiatry;  Laterality: Right;  need to make card not  a irrigation and debridement card   IR FLUORO GUIDE CV LINE RIGHT  03/01/2019   IR PERC TUN PERIT CATH WO PORT S&I /IMAG  06/27/2021   IR REMOVAL TUN CV CATH W/O FL  05/10/2019   IR REMOVAL TUN CV CATH W/O FL  02/29/2020   IR REMOVAL TUN CV CATH W/O FL  07/19/2021   IR US GUIDE VASC ACCESS RIGHT  03/01/2019   IR US GUIDE VASC ACCESS RIGHT  06/27/2021   IRRIGATION AND DEBRIDEMENT ELBOW Left 06/24/2021   Procedure: IRRIGATION AND DEBRIDEMENT ELBOW;  Surgeon: Lovell Sheehan, MD;  Location: [facility];  Service: Orthopedics;  Laterality: Left;   KIDNEY TRANSPLANT  03/27/2015   Penile Pump Insertion     PERIPHERAL VASCULAR ATHERECTOMY  09/11/2021   Procedure: PERIPHERAL VASCULAR ATHERECTOMY;  Surgeon: Wellington Hampshire, MD;  Location: [facility];  Service: Cardiovascular;;  Shockwave Lithotripsy   TEE WITHOUT CARDIOVERSION N/A 01/17/2020    Procedure: TRANSESOPHAGEAL ECHOCARDIOGRAM (TEE) WITH PROPOFOL;  Surgeon: Arnoldo Lenis, MD;  [facility];  Service: Endoscopy;  Laterality: N/A;   WOUND DEBRIDEMENT Right 03/06/2021   Procedure: DEBRIDEMENT WOUND;  Surgeon: Trula Slade, DPM;  [facility];  Service: Podiatry;  Laterality: Right;   Social History   Occupational History   Not on file  Tobacco Use   Smoking status: Never   Smokeless tobacco: Never  Vaping Use   Vaping Use: Never used  Substance and Sexual Activity   Alcohol use: No    Alcohol/week: 0.0 standard drinks   Drug use: No   Sexual activity: Not on file

## 2021-10-14 ENCOUNTER — Ambulatory Visit (INDEPENDENT_AMBULATORY_CARE_PROVIDER_SITE_OTHER): Payer: 59 | Admitting: Orthopedic Surgery

## 2021-10-14 ENCOUNTER — Encounter: Payer: Self-pay | Admitting: Orthopedic Surgery

## 2021-10-14 DIAGNOSIS — Z89431 Acquired absence of right foot: Secondary | ICD-10-CM

## 2021-10-14 MED ORDER — SULFAMETHOXAZOLE-TRIMETHOPRIM 800-160 MG PO TABS: 1.0000 | ORAL_TABLET | Freq: Two times a day (BID) | ORAL | 0 refills | Status: DC

## 2021-10-14 MED ORDER — GABAPENTIN 300 MG PO CAPS: 300.0000 mg | ORAL_CAPSULE | Freq: Three times a day (TID) | ORAL | 3 refills | Status: DC

## 2021-10-14 NOTE — Progress Notes (Signed)
Office Visit Note   Patient: Raymond [name]           Date of Birth: 01-30-1961           MRN: [id number] Visit Date: 10/14/2021              Requested by: [name], [place],  [place] [address] PCP: [name], FNP  Chief Complaint  Patient presents with   Right Foot - Routine Post Op    09/25/21 right transmet amputation       HPI: Patient is a 61 year old gentleman status post right transmetatarsal amputation with Kerecis micro powder for tissue reinforcement patient also had a large plantar wound.  Assessment & Plan: Visit Diagnoses:  1. History of transmetatarsal amputation of right foot ([place])     Plan: Prescription was called in for Neurontin for his neuropathic pain and a prescription for Bactrim DS.  Follow-Up Instructions: Return in about 1 week (around 10/21/2021).   Ortho Exam  Patient is alert, oriented, no adenopathy, well-dressed, normal affect, normal respiratory effort. Patient is just completed his doxycycline.  He has swelling around the foot and ankle and decreased range of motion of the ankle his biggest complaint is neuropathic pain.  There is healthy granulation tissue along the wound edge and the plantar ulcer is less than half his previous size  Imaging: No results found.   Labs: Lab Results  Component Value Date   HGBA1C 10.4 (H) 08/14/2021   HGBA1C 10.6 (H) 06/24/2021   HGBA1C >15 03/05/2021   ESRSEDRATE 88 (H) 08/14/2021   ESRSEDRATE 47 (H) 07/12/2021   ESRSEDRATE 39 (H) 07/08/2021   CRP 25.2 (H) 08/14/2021   CRP 11.6 (H) 06/27/2021   CRP 2.7 04/17/2021   REPTSTATUS 09/30/2021 FINAL 09/25/2021   GRAMSTAIN  09/25/2021    RARE WBC PRESENT, PREDOMINANTLY PMN RARE GRAM POSITIVE COCCI IN PAIRS    CULT  09/25/2021    FEW GROUP B STREP(S.AGALACTIAE)ISOLATED TESTING AGAINST S. AGALACTIAE NOT ROUTINELY PERFORMED DUE TO PREDICTABILITY OF AMP/PEN/VAN SUSCEPTIBILITY. FEW STAPHYLOCOCCUS  LUGDUNENSIS NO ANAEROBES ISOLATED Performed at [facility], [place] [address] [place], [place] [address]    LABORGA STAPHYLOCOCCUS LUGDUNENSIS 09/25/2021     Lab Results  Component Value Date   ALBUMIN 2.3 (L) 08/30/2021   ALBUMIN 2.1 (L) 08/29/2021   ALBUMIN 3.2 (L) 07/12/2021   PREALBUMIN <5 (L) 08/14/2021    Lab Results  Component Value Date   MG 1.3 (L) 08/29/2021   MG 1.9 06/26/2021   MG 1.4 (L) 06/25/2021   Lab Results  Component Value Date   VD25OH 23.8 (L) 12/11/2020    Lab Results  Component Value Date   PREALBUMIN <5 (L) 08/14/2021      Latest Ref Rng & Units 08/30/2021   10:19 AM 08/29/2021    6:56 AM 08/28/2021    4:09 PM  CBC EXTENDED  WBC 4.0 - 10.5 K/uL 5.0   5.9   5.0    RBC 4.22 - 5.81 MIL/uL 3.97   3.73   3.87    Hemoglobin 13.0 - 17.0 g/dL 9.8   9.3   9.6    HCT 39.0 - 52.0 % 32.9   31.3   33.1    Platelets 150 - 400 K/uL 359   336   344    NEUT# 1.7 - 7.7 K/uL 3.1   3.8   2.4    Lymph# 0.7 - 4.0 K/uL 1.4   1.5  1.8       There is no height or weight on file to calculate BMI.  Orders:  No orders of the defined types were placed in this encounter.  Meds ordered this encounter  Medications   gabapentin (NEURONTIN) 300 MG capsule    Sig: Take 1 capsule (300 mg total) by mouth 3 (three) times daily. 3 times a day when necessary neuropathy pain    Dispense:  90 capsule    Refill:  3   sulfamethoxazole-trimethoprim (BACTRIM DS) 800-160 MG tablet    Sig: Take 1 tablet by mouth 2 (two) times daily.    Dispense:  30 tablet    Refill:  0     Procedures: No procedures performed  Clinical Data: No additional findings.  ROS:  All other systems negative, except as noted in the HPI. Review of Systems  Objective: Vital Signs: There were no vitals taken for this visit.  Specialty Comments:  No specialty comments available.  PMFS History: Patient Active Problem List   Diagnosis Date Noted   Cutaneous abscess of right foot     Hyperkalemia 08/28/2021   Obesity (BMI 30-39.9) 08/28/2021   History of anemia due to chronic kidney disease 08/28/2021   Diabetic foot ulcer ([place]) 08/14/2021   CKD (chronic kidney disease) stage 3, GFR 30-59 ml/min (HCC) 08/14/2021   Hypomagnesemia 06/25/2021   Septic arthritis of elbow, left ([place]) 06/24/2021   Peripheral vascular disease ([place]) 04/24/2020   Pyogenic inflammation of bone (HCC)    Thrombophlebitis of superficial veins of right lower extremity    Difficult intravenous access    MRSA bacteremia    Diabetic foot infection ([place])    Infected wound 01/12/2020   Critical lower limb ischemia ([place]) 02/02/2019   Personal history of noncompliance with medical treatment, presenting hazards to health 10/07/2018   Critical limb ischemia with history of revascularization of same extremity ([place]) 07/27/2018   Mixed hyperlipidemia 02/19/2018   History of renal transplant 02/19/2018   Conductive hearing loss, bilateral 08/31/2017   Nuclear sclerotic cataract of right eye 10/07/2016   Pseudophakia of left eye 10/07/2016   Hypertension due to endocrine disorder 03/04/2016   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 ([place]) 03/04/2016   Renal failure 03/04/2016   Proliferative diabetic retinopathy with macular edema associated with type 2 diabetes mellitus ([place]) 03/04/2016   Immunosuppression ([place]) 12/10/2015   Nausea vomiting and diarrhea 12/10/2015   Type 2 diabetes mellitus ([place]) 12/10/2015   Essential hypertension, benign 12/10/2015   Pancytopenia ([place]) 12/10/2015   Encounter for screening colonoscopy 11/15/2014   GERD (gastroesophageal reflux disease) 11/15/2014   Past Medical History:  Diagnosis Date   Diabetes ([place])    Diabetes mellitus without complication ([place])    ESRD (end stage renal disease) ([place])    03/09/2015- patient had a kidney transplant   Hypertension    Peripheral vascular disease ([place])    Renal  disorder    Renal insufficiency     Family History  Problem Relation Age of Onset   Diabetes Mother    Heart disease Father    Colon cancer Neg Hx     Past Surgical History:  Procedure Laterality Date   ABDOMINAL AORTOGRAM W/LOWER EXTREMITY N/A 09/11/2021   Procedure: ABDOMINAL AORTOGRAM W/LOWER EXTREMITY;  Surgeon: Wellington Hampshire, MD;  Location: [facility];  Service: Cardiovascular;  Laterality: N/A;   AMPUTATION Right 09/25/2021   Procedure: RIGHT TRANSMETATARSAL AMPUTATION AND APPLY  TISSUE GRAFT;  Surgeon: Newt Minion, MD;  Location: [facility];  Service: Orthopedics;  Laterality: Right;   BONE BIOPSY Right 03/06/2021   Procedure: BONE BIOPSY;  Surgeon: Trula Slade, DPM;  [facility];  Service: Podiatry;  Laterality: Right;   CENTRAL VENOUS CATHETER INSERTION Right 01/20/2020   Procedure: INSERTION CENTRAL LINE ADULT; Tunneled central line;  Surgeon: Virl Cagey, MD;  [facility];  Service: General;  Laterality: Right;   COLONOSCOPY N/A 12/05/2014   NMM:[id number] external and internal hemorrhoid/mild diverticulosis/11 polyps removed   ESOPHAGOGASTRODUODENOSCOPY N/A 12/05/2014   SRP:RXYV duodenitis   GRAFT APPLICATION Right 85/92/9244   Procedure: GRAFT APPLICATION;  Surgeon: Trula Slade, DPM;  [facility];  Service: Podiatry;  Laterality: Right;   INCISION AND DRAINAGE OF WOUND Right 08/15/2021   Procedure: IRRIGATION AND DEBRIDEMENT WOUND;  Surgeon: Landis Martins, DPM;  [facility];  Service: Podiatry;  Laterality: Right;  need to make card not  a irrigation and debridement card   IR FLUORO GUIDE CV LINE RIGHT  03/01/2019   IR PERC TUN PERIT CATH WO PORT S&I /IMAG  06/27/2021   IR REMOVAL TUN CV CATH W/O FL  05/10/2019   IR REMOVAL TUN CV CATH W/O FL  02/29/2020   IR REMOVAL TUN CV CATH W/O FL  07/19/2021   IR US GUIDE VASC ACCESS RIGHT  03/01/2019   IR US GUIDE VASC ACCESS RIGHT  06/27/2021   IRRIGATION AND DEBRIDEMENT ELBOW Left  06/24/2021   Procedure: IRRIGATION AND DEBRIDEMENT ELBOW;  Surgeon: Lovell Sheehan, MD;  Location: [facility];  Service: Orthopedics;  Laterality: Left;   KIDNEY TRANSPLANT  03/27/2015   Penile Pump Insertion     PERIPHERAL VASCULAR ATHERECTOMY  09/11/2021   Procedure: PERIPHERAL VASCULAR ATHERECTOMY;  Surgeon: Wellington Hampshire, MD;  Location: [facility];  Service: Cardiovascular;;  Shockwave Lithotripsy   TEE WITHOUT CARDIOVERSION N/A 01/17/2020   Procedure: TRANSESOPHAGEAL ECHOCARDIOGRAM (TEE) WITH PROPOFOL;  Surgeon: Arnoldo Lenis, MD;  [facility];  Service: Endoscopy;  Laterality: N/A;   WOUND DEBRIDEMENT Right 03/06/2021   Procedure: DEBRIDEMENT WOUND;  Surgeon: Trula Slade, DPM;  [facility];  Service: Podiatry;  Laterality: Right;   Social History   Occupational History   Not on file  Tobacco Use   Smoking status: Never   Smokeless tobacco: Never  Vaping Use   Vaping Use: Never used  Substance and Sexual Activity   Alcohol use: No    Alcohol/week: 0.0 standard drinks   Drug use: No   Sexual activity: Not on file

## 2021-10-16 ENCOUNTER — Ambulatory Visit: Payer: BC Managed Care – PPO | Admitting: Nurse Practitioner

## 2021-10-17 ENCOUNTER — Telehealth (INDEPENDENT_AMBULATORY_CARE_PROVIDER_SITE_OTHER): Payer: 59 | Admitting: Nurse Practitioner

## 2021-10-17 DIAGNOSIS — S98131A Complete traumatic amputation of one right lesser toe, initial encounter: Secondary | ICD-10-CM

## 2021-10-17 DIAGNOSIS — E782 Mixed hyperlipidemia: Secondary | ICD-10-CM

## 2021-10-17 DIAGNOSIS — N182 Chronic kidney disease, stage 2 (mild): Secondary | ICD-10-CM

## 2021-10-17 DIAGNOSIS — Z794 Long term (current) use of insulin: Secondary | ICD-10-CM | POA: Diagnosis not present

## 2021-10-17 DIAGNOSIS — I129 Hypertensive chronic kidney disease with stage 1 through stage 4 chronic kidney disease, or unspecified chronic kidney disease: Secondary | ICD-10-CM

## 2021-10-17 DIAGNOSIS — E1122 Type 2 diabetes mellitus with diabetic chronic kidney disease: Secondary | ICD-10-CM | POA: Diagnosis not present

## 2021-10-17 NOTE — Patient Instructions (Signed)

## 2021-10-17 NOTE — Progress Notes (Signed)
Virtual Visit via MyChart   This visit type was conducted due to national recommendations for restrictions regarding the COVID-19 Pandemic (e.g. social distancing) in an effort to limit this patient's exposure and mitigate transmission in our community.  Due to his co-morbid illnesses, this patient is at least at moderate risk for complications without adequate follow up.  This format is felt to be most appropriate for this patient at this time.  All issues noted in this document were discussed and addressed.  A limited physical exam was performed with this format.    This visit type was conducted due to national recommendations for restrictions regarding the COVID-19 Pandemic (e.g. social distancing) in an effort to limit this patient's exposure and mitigate transmission in our community.  Patients identity confirmed using two different identifiers.  This format is felt to be most appropriate for this patient at this time.  All issues noted in this document were discussed and addressed.  No physical exam was performed (except for noted visual exam findings with Video Visits).    Date:  10/17/2021   ID:  Raymond [name], DOB Jul 14, 1960, MRN [id number]  Patient Location:  Home - spoke with Raymond [name] and Raymond [name]  Provider location:   Office    Chief Complaint:  BP and diabetes follow up  History of Present Illness:    Raymond [name] is a 61 y.o. male who presents via video conferencing for a telehealth visit today.    The patient does not have symptoms concerning for COVID-19 infection (fever, chills, cough, or new shortness of breath).   Pt here today for DM & HTN F/U. He has recently had his toes to his right foot amputated, continues to follow up with Dr Sharol Given. He reports his protein was 2.1 and wants it to be 2.4.  His blood sugar was 223 this morning.  He is on Trulicity 7.34 mg.    He does not feel his foot pain is getting better. After he has had his  foot up and walking to the bathroom his toes that were amputated it will feel like "fire", he does go to Dr Sharol Given weekly to have evaluated.   He reports he was on antibiotics and his phosphorus increased.   Hypertension This is a chronic problem. The current episode started more than 1 year ago. The problem is unchanged. The problem is controlled. Pertinent negatives include no anxiety, blurred vision, chest pain or palpitations. There are no compliance problems.   Hyperlipidemia This is a chronic problem. Pertinent negatives include no abdominal pain or chest pain.  Diabetes This is a chronic problem. The current episode started more than 1 year ago. Pertinent negatives include no abdominal pain or chest pain.    Past Medical History:  Diagnosis Date   Diabetes ([place])    Diabetes mellitus without complication ([place])    ESRD (end stage renal disease) ([place])    03/09/2015- patient had a kidney transplant   Hypertension    Peripheral vascular disease ([place])    Renal disorder    Renal insufficiency    Past Surgical History:  Procedure Laterality Date   ABDOMINAL AORTOGRAM W/LOWER EXTREMITY N/A 09/11/2021   Procedure: ABDOMINAL AORTOGRAM W/LOWER EXTREMITY;  Surgeon: Wellington Hampshire, MD;  Location: [facility];  Service: Cardiovascular;  Laterality: N/A;   AMPUTATION Right 09/25/2021   Procedure: RIGHT TRANSMETATARSAL AMPUTATION AND APPLY TISSUE GRAFT;  Surgeon: Newt Minion, MD;  Location: [facility];  Service: Orthopedics;  Laterality: Right;   BONE BIOPSY Right 03/06/2021   Procedure: BONE BIOPSY;  Surgeon: Trula Slade, DPM;  [facility];  Service: Podiatry;  Laterality: Right;   CENTRAL VENOUS CATHETER INSERTION Right 01/20/2020   Procedure: INSERTION CENTRAL LINE ADULT; Tunneled central line;  Surgeon: Virl Cagey, MD;  [facility];  Service: General;  Laterality: Right;   COLONOSCOPY N/A 12/05/2014   CBS:[id number] external and internal hemorrhoid/mild  diverticulosis/11 polyps removed   ESOPHAGOGASTRODUODENOSCOPY N/A 12/05/2014   BWG:YKZL duodenitis   GRAFT APPLICATION Right 93/57/0177   Procedure: GRAFT APPLICATION;  Surgeon: Trula Slade, DPM;  [facility];  Service: Podiatry;  Laterality: Right;   INCISION AND DRAINAGE OF WOUND Right 08/15/2021   Procedure: IRRIGATION AND DEBRIDEMENT WOUND;  Surgeon: Landis Martins, DPM;  [facility];  Service: Podiatry;  Laterality: Right;  need to make card not  a irrigation and debridement card   IR FLUORO GUIDE CV LINE RIGHT  03/01/2019   IR PERC TUN PERIT CATH WO PORT S&I /IMAG  06/27/2021   IR REMOVAL TUN CV CATH W/O FL  05/10/2019   IR REMOVAL TUN CV CATH W/O FL  02/29/2020   IR REMOVAL TUN CV CATH W/O FL  07/19/2021   IR US GUIDE VASC ACCESS RIGHT  03/01/2019   IR US GUIDE VASC ACCESS RIGHT  06/27/2021   IRRIGATION AND DEBRIDEMENT ELBOW Left 06/24/2021   Procedure: IRRIGATION AND DEBRIDEMENT ELBOW;  Surgeon: Lovell Sheehan, MD;  Location: [facility];  Service: Orthopedics;  Laterality: Left;   KIDNEY TRANSPLANT  03/27/2015   Penile Pump Insertion     PERIPHERAL VASCULAR ATHERECTOMY  09/11/2021   Procedure: PERIPHERAL VASCULAR ATHERECTOMY;  Surgeon: Wellington Hampshire, MD;  Location: [facility];  Service: Cardiovascular;;  Shockwave Lithotripsy   TEE WITHOUT CARDIOVERSION N/A 01/17/2020   Procedure: TRANSESOPHAGEAL ECHOCARDIOGRAM (TEE) WITH PROPOFOL;  Surgeon: Arnoldo Lenis, MD;  [facility];  Service: Endoscopy;  Laterality: N/A;   WOUND DEBRIDEMENT Right 03/06/2021   Procedure: DEBRIDEMENT WOUND;  Surgeon: Trula Slade, DPM;  [facility];  Service: Podiatry;  Laterality: Right;     Current Meds  Medication Sig   aspirin EC 81 MG tablet Take 1 tablet (81 mg total) by mouth daily. Swallow whole.   atorvastatin (LIPITOR) 10 MG tablet Take 10 mg by mouth daily.   blood glucose meter kit and supplies KIT Dispense based on patient and insurance  preference. Use up to four times daily as directed. (FOR ICD-9 250.00, 250.01).   Blood Glucose Monitoring Suppl (ACCU-CHEK GUIDE ME) w/Device KIT 1 Piece by Does not apply route as directed.   brimonidine (ALPHAGAN) 0.2 % ophthalmic solution Place 1 drop into both eyes 3 (three) times daily.   cloNIDine (CATAPRES - DOSED IN MG/24 HR) 0.3 mg/24hr patch Place 1 patch (0.3 mg total) onto the skin once a week.   clopidogrel (PLAVIX) 75 MG tablet Take 1 tablet (75 mg total) by mouth daily.   dorzolamide-timolol (COSOPT) 22.3-6.8 MG/ML ophthalmic solution Place 1 drop into both eyes 2 (two) times daily.   doxazosin (CARDURA) 4 MG tablet Take 4 mg by mouth daily.   doxycycline (VIBRA-TABS) 100 MG tablet Take 1 tablet (100 mg total) by mouth 2 (two) times daily.   Dulaglutide (TRULICITY) 9.39 QZ/0.0PQ SOPN Inject 0.75 mg into the skin once a week. (Patient taking differently: Inject 0.75 mg into the skin every Sunday.)   gabapentin (NEURONTIN) 300 MG capsule Take  1 capsule (300 mg total) by mouth 3 (three) times daily. 3 times a day when necessary neuropathy pain   glucose blood (ACCU-CHEK GUIDE) test strip Use as instructed 4 x daily. E11.65   hydrALAZINE (APRESOLINE) 25 MG tablet Take 25 mg by mouth 2 (two) times daily.   Insulin NPH, Human,, Isophane, (NOVOLIN N FLEXPEN) 100 UNIT/ML Kiwkpen Inject 60 Units into the skin in the morning and at bedtime.   Insulin Regular Human (NOVOLIN R FLEXPEN RELION) 100 UNIT/ML KwikPen Inject 1-9 Units as directed 3 (three) times daily before meals. Sliding scale CBG 70 - 120: 0 units CBG 121 - 150: 1 unit,  CBG 151 - 200: 2 units,  CBG 201 - 250: 3 units,  CBG 251 - 300: 5 units,  CBG 301 - 350: 7 units,  CBG 351 - 400: 9 units   CBG > 400: 9 units and notify your doctor   latanoprost (XALATAN) 0.005 % ophthalmic solution Place 1 drop into both eyes at bedtime.   Multiple Vitamin (MULTIVITAMIN) capsule Take 1 capsule by mouth daily.   mupirocin ointment (BACTROBAN) 2 %  Apply 1 application. topically 2 (two) times daily. For wound care   oxyCODONE-acetaminophen (PERCOCET/ROXICET) 5-325 MG tablet Take 1 tablet by mouth every 4 (four) hours as needed.   sodium bicarbonate 650 MG tablet Take 1,300 mg by mouth 3 (three) times daily.   sodium zirconium cyclosilicate (LOKELMA) 10 g PACK packet Take 10 g by mouth daily.   sulfamethoxazole-trimethoprim (BACTRIM DS) 800-160 MG tablet Take 1 tablet by mouth 2 (two) times daily.   tacrolimus (PROGRAF) 1 MG capsule Take 4 capsules (4 mg total) by mouth 2 (two) times daily.   Vitamin D, Ergocalciferol, (DRISDOL) 50000 units CAPS capsule Take 50,000 Units by mouth every 30 (thirty) days.   Current Facility-Administered Medications for the 10/17/21 encounter (Video Visit) with [name], FNP  Medication   sodium chloride flush (NS) 0.9 % injection 3 mL     Allergies:   Patient has no known allergies.   Social History   Tobacco Use   Smoking status: Never   Smokeless tobacco: Never  Vaping Use   Vaping Use: Never used  Substance Use Topics   Alcohol use: No    Alcohol/week: 0.0 standard drinks   Drug use: No     Family Hx: The patient's family history includes Diabetes in his mother; Heart disease in his father. There is no history of Colon cancer.  ROS:   Please see the history of present illness.    Review of Systems  Constitutional: Negative.   Eyes:  Negative for blurred vision.  Respiratory: Negative.    Cardiovascular: Negative.  Negative for chest pain and palpitations.  Gastrointestinal: Negative.  Negative for abdominal pain.  Musculoskeletal:        Foot pain  Neurological: Negative.   Psychiatric/Behavioral: Negative.     All other systems reviewed and are negative.   Labs/Other Tests and Data Reviewed:    Recent Labs: 08/29/2021: ALT 14; Magnesium 1.3 08/30/2021: Hemoglobin 9.8; Platelets 359 09/09/2021: BUN 22; Creatinine, Ser 1.03; Potassium 5.2; Sodium 138   Recent Lipid Panel Lab  Results  Component Value Date/Time   CHOL 104 08/23/2021 03:13 PM   TRIG 111 08/23/2021 03:13 PM   HDL 38 (L) 08/23/2021 03:13 PM   CHOLHDL 2.7 08/23/2021 03:13 PM   LDLCALC 46 08/23/2021 03:13 PM    Wt Readings from Last 3 Encounters:  09/25/21 217 lb (98.4 kg)  09/11/21 221 lb (100.2 kg)  09/10/21 222 lb 3.2 oz (100.8 kg)     Exam:    Vital Signs:  There were no vitals taken for this visit.    Physical Exam Constitutional:      General: He is not in acute distress.    Appearance: Normal appearance. He is obese.  Pulmonary:     Effort: Pulmonary effort is normal. No respiratory distress.  Neurological:     General: No focal deficit present.     Mental Status: He is alert and oriented to person, place, and time. Mental status is at baseline.     Cranial Nerves: No cranial nerve deficit.  Psychiatric:        Mood and Affect: Mood and affect normal.        Behavior: Behavior normal.        Thought Content: Thought content normal.        Cognition and Memory: Memory normal.        Judgment: Judgment normal.    ASSESSMENT & PLAN:     1. Type 2 diabetes mellitus with stage 2 chronic kidney disease, with long-term current use of insulin ([place]) He has been followed by Endocrinology, I have advised him to call for an appt., not well controlled. I have stressed the importance for him to continue follow up with Endocrinology - Hemoglobin A1c; Future - BMP8+eGFR; Future  2. Benign hypertension with chronic kidney disease No BP this visit he is at home - [facility]; Future  3. Mixed hyperlipidemia He is to continue with statin, tolerating well.   4. Amputation of toe of right foot ([place]) He had an amputation of his toes on right foot due to poor healing and continues being followed by orthopedics. He was treat with antibiotics and is planning to do PT. He feels like he is doing okay  COVID-19 Education: The signs and symptoms of COVID-19 were discussed with the patient and  how to seek care for testing (follow up with PCP or arrange E-visit).  The importance of social distancing was discussed today.  Patient Risk:   After full review of this patients clinical status, I feel that they are at least moderate risk at this time.  Time:   Today, I have spent 22 minutes/ seconds with the patient with telehealth technology discussing above diagnoses.     Medication Adjustments/Labs and Tests Ordered: Current medicines are reviewed at length with the patient today.  Concerns regarding medicines are outlined above.   Tests Ordered: Orders Placed This Encounter  Procedures   Hemoglobin A1c   BMP8+eGFR    Medication Changes: No orders of the defined types were placed in this encounter.   Disposition:  Follow up 4 months  Signed, [name], FNP

## 2021-10-18 ENCOUNTER — Ambulatory Visit: Payer: Self-pay | Admitting: Cardiovascular Disease

## 2021-10-24 ENCOUNTER — Ambulatory Visit (INDEPENDENT_AMBULATORY_CARE_PROVIDER_SITE_OTHER): Payer: 59 | Admitting: Orthopedic Surgery

## 2021-10-24 DIAGNOSIS — Z89431 Acquired absence of right foot: Secondary | ICD-10-CM

## 2021-10-25 ENCOUNTER — Encounter: Payer: Self-pay | Admitting: Orthopedic Surgery

## 2021-10-25 NOTE — Progress Notes (Signed)
Office Visit Note   Patient: Raymond [name]           Date of Birth: Apr 21, 1961           MRN: [id number] Visit Date: 10/24/2021              Requested by: [name], [address],  [place] [address] PCP: [name], FNP  Chief Complaint  Patient presents with   Right Foot - Routine Post Op    09/25/21 right transmet amputation      HPI: Patient is a 61 year old gentleman who is 4 weeks status post right transmetatarsal amputation.  Patient is currently walking on his foot has drainage.  Assessment & Plan: Visit Diagnoses:  1. History of transmetatarsal amputation of right foot ([place])     Plan: Recommended minimizing weightbearing continue work on Achilles stretching.  Dial soap cleansing dry dressing change daily.  Follow-Up Instructions: Return in about 4 weeks (around 11/21/2021).   Ortho Exam  Patient is alert, oriented, no adenopathy, well-dressed, normal affect, normal respiratory effort. Examination there is no Achilles contracture there is a wound 10 mm in diameter on the plantar aspect with healthy granulation tissue this was touched with silver nitrate.  Imaging: No results found. No images are attached to the encounter.  Labs: Lab Results  Component Value Date   HGBA1C 10.4 (H) 08/14/2021   HGBA1C 10.6 (H) 06/24/2021   HGBA1C >15 03/05/2021   ESRSEDRATE 88 (H) 08/14/2021   ESRSEDRATE 47 (H) 07/12/2021   ESRSEDRATE 39 (H) 07/08/2021   CRP 25.2 (H) 08/14/2021   CRP 11.6 (H) 06/27/2021   CRP 2.7 04/17/2021   REPTSTATUS 09/30/2021 FINAL 09/25/2021   GRAMSTAIN  09/25/2021    RARE WBC PRESENT, PREDOMINANTLY PMN RARE GRAM POSITIVE COCCI IN PAIRS    CULT  09/25/2021    FEW GROUP B STREP(S.AGALACTIAE)ISOLATED TESTING AGAINST S. AGALACTIAE NOT ROUTINELY PERFORMED DUE TO PREDICTABILITY OF AMP/PEN/VAN SUSCEPTIBILITY. FEW STAPHYLOCOCCUS LUGDUNENSIS NO ANAEROBES ISOLATED Performed at [facility], [place] [address] [place], [place] [address]    LABORGA STAPHYLOCOCCUS LUGDUNENSIS 09/25/2021     Lab Results  Component Value Date   ALBUMIN 2.3 (L) 08/30/2021   ALBUMIN 2.1 (L) 08/29/2021   ALBUMIN 3.2 (L) 07/12/2021   PREALBUMIN <5 (L) 08/14/2021    Lab Results  Component Value Date   MG 1.3 (L) 08/29/2021   MG 1.9 06/26/2021   MG 1.4 (L) 06/25/2021   Lab Results  Component Value Date   VD25OH 23.8 (L) 12/11/2020    Lab Results  Component Value Date   PREALBUMIN <5 (L) 08/14/2021      Latest Ref Rng & Units 08/30/2021   10:19 AM 08/29/2021    6:56 AM 08/28/2021    4:09 PM  CBC EXTENDED  WBC 4.0 - 10.5 K/uL 5.0   5.9   5.0    RBC 4.22 - 5.81 MIL/uL 3.97   3.73   3.87    Hemoglobin 13.0 - 17.0 g/dL 9.8   9.3   9.6    HCT 39.0 - 52.0 % 32.9   31.3   33.1    Platelets 150 - 400 K/uL 359   336   344    NEUT# 1.7 - 7.7 K/uL 3.1   3.8   2.4    Lymph# 0.7 - 4.0 K/uL 1.4   1.5   1.8       There is no height or weight on file  to calculate BMI.  Orders:  No orders of the defined types were placed in this encounter.  No orders of the defined types were placed in this encounter.    Procedures: No procedures performed  Clinical Data: No additional findings.  ROS:  All other systems negative, except as noted in the HPI. Review of Systems  Objective: Vital Signs: There were no vitals taken for this visit.  Specialty Comments:  No specialty comments available.  PMFS History: Patient Active Problem List   Diagnosis Date Noted   Cutaneous abscess of right foot    Hyperkalemia 08/28/2021   Obesity (BMI 30-39.9) 08/28/2021   History of anemia due to chronic kidney disease 08/28/2021   Diabetic foot ulcer ([place]) 08/14/2021   CKD (chronic kidney disease) stage 3, GFR 30-59 ml/min (HCC) 08/14/2021   Hypomagnesemia 06/25/2021   Septic arthritis of elbow, left ([place]) 06/24/2021   Peripheral vascular disease ([place]) 04/24/2020   Pyogenic inflammation of bone (HCC)    Thrombophlebitis of  superficial veins of right lower extremity    Difficult intravenous access    MRSA bacteremia    Diabetic foot infection ([place])    Infected wound 01/12/2020   Critical lower limb ischemia ([place]) 02/02/2019   Personal history of noncompliance with medical treatment, presenting hazards to health 10/07/2018   Critical limb ischemia with history of revascularization of same extremity ([place]) 07/27/2018   Mixed hyperlipidemia 02/19/2018   History of renal transplant 02/19/2018   Conductive hearing loss, bilateral 08/31/2017   Nuclear sclerotic cataract of right eye 10/07/2016   Pseudophakia of left eye 10/07/2016   Hypertension due to endocrine disorder 03/04/2016   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 ([place]) 03/04/2016   Renal failure 03/04/2016   Proliferative diabetic retinopathy with macular edema associated with type 2 diabetes mellitus ([place]) 03/04/2016   Immunosuppression ([place]) 12/10/2015   Nausea vomiting and diarrhea 12/10/2015   Type 2 diabetes mellitus ([place]) 12/10/2015   Essential hypertension, benign 12/10/2015   Pancytopenia ([place]) 12/10/2015   Encounter for screening colonoscopy 11/15/2014   GERD (gastroesophageal reflux disease) 11/15/2014   Past Medical History:  Diagnosis Date   Diabetes ([place])    Diabetes mellitus without complication ([place])    ESRD (end stage renal disease) ([place])    03/09/2015- patient had a kidney transplant   Hypertension    Peripheral vascular disease ([place])    Renal disorder    Renal insufficiency     Family History  Problem Relation Age of Onset   Diabetes Mother    Heart disease Father    Colon cancer Neg Hx     Past Surgical History:  Procedure Laterality Date   ABDOMINAL AORTOGRAM W/LOWER EXTREMITY N/A 09/11/2021   Procedure: ABDOMINAL AORTOGRAM W/LOWER EXTREMITY;  Surgeon: Wellington Hampshire, MD;  Location: [facility];  Service: Cardiovascular;  Laterality: N/A;    AMPUTATION Right 09/25/2021   Procedure: RIGHT TRANSMETATARSAL AMPUTATION AND APPLY TISSUE GRAFT;  Surgeon: Newt Minion, MD;  Location: [facility];  Service: Orthopedics;  Laterality: Right;   BONE BIOPSY Right 03/06/2021   Procedure: BONE BIOPSY;  Surgeon: Trula Slade, DPM;  [facility];  Service: Podiatry;  Laterality: Right;   CENTRAL VENOUS CATHETER INSERTION Right 01/20/2020   Procedure: INSERTION CENTRAL LINE ADULT; Tunneled central line;  Surgeon: Virl Cagey, MD;  [facility];  Service: General;  Laterality: Right;   COLONOSCOPY N/A 12/05/2014   FMB:[id number] external and  internal hemorrhoid/mild diverticulosis/11 polyps removed   ESOPHAGOGASTRODUODENOSCOPY N/A 12/05/2014   YWV:PXTG duodenitis   GRAFT APPLICATION Right 62/69/4854   Procedure: GRAFT APPLICATION;  Surgeon: Trula Slade, DPM;  [facility];  Service: Podiatry;  Laterality: Right;   INCISION AND DRAINAGE OF WOUND Right 08/15/2021   Procedure: IRRIGATION AND DEBRIDEMENT WOUND;  Surgeon: Landis Martins, DPM;  [facility];  Service: Podiatry;  Laterality: Right;  need to make card not  a irrigation and debridement card   IR FLUORO GUIDE CV LINE RIGHT  03/01/2019   IR PERC TUN PERIT CATH WO PORT S&I /IMAG  06/27/2021   IR REMOVAL TUN CV CATH W/O FL  05/10/2019   IR REMOVAL TUN CV CATH W/O FL  02/29/2020   IR REMOVAL TUN CV CATH W/O FL  07/19/2021   IR US GUIDE VASC ACCESS RIGHT  03/01/2019   IR US GUIDE VASC ACCESS RIGHT  06/27/2021   IRRIGATION AND DEBRIDEMENT ELBOW Left 06/24/2021   Procedure: IRRIGATION AND DEBRIDEMENT ELBOW;  Surgeon: Lovell Sheehan, MD;  Location: [facility];  Service: Orthopedics;  Laterality: Left;   KIDNEY TRANSPLANT  03/27/2015   Penile Pump Insertion     PERIPHERAL VASCULAR ATHERECTOMY  09/11/2021   Procedure: PERIPHERAL VASCULAR ATHERECTOMY;  Surgeon: Wellington Hampshire, MD;  Location: [facility];  Service: Cardiovascular;;  Shockwave Lithotripsy   TEE WITHOUT  CARDIOVERSION N/A 01/17/2020   Procedure: TRANSESOPHAGEAL ECHOCARDIOGRAM (TEE) WITH PROPOFOL;  Surgeon: Arnoldo Lenis, MD;  [facility];  Service: Endoscopy;  Laterality: N/A;   WOUND DEBRIDEMENT Right 03/06/2021   Procedure: DEBRIDEMENT WOUND;  Surgeon: Trula Slade, DPM;  [facility];  Service: Podiatry;  Laterality: Right;   Social History   Occupational History   Not on file  Tobacco Use   Smoking status: Never   Smokeless tobacco: Never  Vaping Use   Vaping Use: Never used  Substance and Sexual Activity   Alcohol use: No    Alcohol/week: 0.0 standard drinks   Drug use: No   Sexual activity: Not on file

## 2021-11-04 ENCOUNTER — Telehealth: Payer: Self-pay | Admitting: Pharmacy Technician

## 2021-11-04 ENCOUNTER — Other Ambulatory Visit (HOSPITAL_COMMUNITY): Payer: Self-pay

## 2021-11-04 NOTE — Telephone Encounter (Signed)
Patient Advocate Encounter   Received notification from CoverMyMeds that prior authorization for Trulicity 9.01Q is required by his/her insurance [employer].   PA submitted on 11/04/21 [place] Status is pending    [facility] will continue to follow:  Patient Advocate Fax:  [phone]

## 2021-11-07 ENCOUNTER — Other Ambulatory Visit (HOSPITAL_COMMUNITY): Payer: Self-pay

## 2021-11-07 NOTE — Telephone Encounter (Signed)
Patient Advocate Encounter  Prior Authorization for Trulicity 5.91 GA/8.9 ml has been approved.    PA# [id number] Effective dates: 11/04/2021 through 11/05/2022

## 2021-11-14 ENCOUNTER — Telehealth: Payer: Self-pay | Admitting: Orthopedic Surgery

## 2021-11-14 ENCOUNTER — Other Ambulatory Visit: Payer: Self-pay

## 2021-11-14 ENCOUNTER — Encounter: Payer: Self-pay | Admitting: Cardiovascular Disease

## 2021-11-14 ENCOUNTER — Ambulatory Visit (INDEPENDENT_AMBULATORY_CARE_PROVIDER_SITE_OTHER): Payer: 59 | Admitting: Cardiovascular Disease

## 2021-11-14 VITALS — BP 118/66 | HR 72 | Ht 67.0 in | Wt 214.0 lb

## 2021-11-14 DIAGNOSIS — I739 Peripheral vascular disease, unspecified: Secondary | ICD-10-CM

## 2021-11-14 DIAGNOSIS — E782 Mixed hyperlipidemia: Secondary | ICD-10-CM | POA: Diagnosis not present

## 2021-11-14 DIAGNOSIS — I70229 Atherosclerosis of native arteries of extremities with rest pain, unspecified extremity: Secondary | ICD-10-CM | POA: Diagnosis not present

## 2021-11-14 DIAGNOSIS — I10 Essential (primary) hypertension: Secondary | ICD-10-CM

## 2021-11-14 MED ORDER — NOVOLIN R FLEXPEN 100 UNIT/ML IJ SOPN: 1.0000 [IU] | PEN_INJECTOR | Freq: Three times a day (TID) | INTRAMUSCULAR | 1 refills | Status: DC

## 2021-11-14 NOTE — Telephone Encounter (Signed)
Pt called stating her need supplies for his amputation site. Please call pt about this matter at [phone].

## 2021-11-14 NOTE — Assessment & Plan Note (Signed)
History of essential hypertension a blood pressure measured today at 118/66.  He is on Catapres patch, and hydralazine.

## 2021-11-14 NOTE — Patient Instructions (Signed)
Medication Instructions:  Your physician recommends that you continue on your current medications as directed. Please refer to the Current Medication list given to you today.  *If you need a refill on your cardiac medications before your next appointment, please call your pharmacy*   Lab Work: None If you have labs (blood work) drawn today and your tests are completely normal, you will receive your results only by: [place] (if you have MyChart) OR A paper copy in the mail If you have any lab test that is abnormal or we need to change your treatment, we will call you to review the results.   Testing/Procedures: Your physician has requested that you have a lower extremity arterial duplex. This test is an ultrasound of the arteries in the legs. It looks at arterial blood flow in the legs . Allow one hour for Lower Arterial scans. There are no restrictions or special instructions    Follow-Up: At [facility], you and your health needs are our priority.  As part of our continuing mission to provide you with exceptional heart care, we have created designated Provider Care Teams.  These Care Teams include your primary Cardiologist (physician) and Advanced Practice Providers (APPs -  Physician Assistants and Nurse Practitioners) who all work together to provide you with the care you need, when you need it.  We recommend signing up for the patient portal called "MyChart".  Sign up information is provided on this After Visit Summary.  MyChart is used to connect with patients for Virtual Visits (Telemedicine).  Patients are able to view lab/test results, encounter notes, upcoming appointments, etc.  Non-urgent messages can be sent to your provider as well.   To learn more about what you can do with MyChart, go to [email or web address].    Your next appointment:   6 month(s)  The format for your next appointment:   In Person  Provider:   Quay Burow, MD     Other  Instructions   Important Information About Sugar

## 2021-11-14 NOTE — Progress Notes (Signed)
11/14/2021 Raymond [name]   Jun 29, 1960  [id number]  Primary Physician [name], FNP Primary Cardiologist: Lorretta Harp MD FACP, [place], [place], [place]  HPI:  Raymond [name] is a 61 y.o.  moderately overweight separated African-American male father of 2 children, grandfather of 2 grandchildren who I last saw in the office 09/10/2021.[name]  He was was referred by Dr. Jacqualyn Posey, his podiatrist for peripheral vascular valuation because of a small ulcer on the plantar surface of his right first metatarsal.  He has a history of treated hypertension, diabetes and hyperlipidemia.  His father had an MI at age 46 and his brother recently had a myocardial infarction as well.  He is never had a heart attack or stroke.  He denies chest pain or shortness of breath.  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  He apparently stepped on a nail approximately 2 to 3 months ago and developed a wound on the plantar surface of his right first metatarsal which Dr. Jacqualyn Posey has been treating fairly frequently over the last several months.  Apparently this is slowly healing.  He had Dopplers performed in [place] recently that were told showed normal circulation.  Since I saw him 6 months ago the wound on his right foot is healed.  He is developed a new wound on his left foot with recent Dopplers that showed a left TBI 0.28, monophasic waveforms in his left SFA with tibial vessel disease, occluded anterior tibial and peroneal artery.  I suspect he will need a amputation of that toe since he has documented osteomyelitis according to Dr. Jacqualyn Posey.  Is unclear whether this will heal but I suspect based on his Dopplers that this will be difficult.  I am also concerned that angiography will lead to radiocontrast nephropathy and jeopardize his transplanted kidney.   He saw Dr. Jacqualyn Posey  who felt that his  wound was unlikely to heal and that he would require amputation.  Dr. Jacqualyn Posey spoke to the patient's nephrologist echoing my concern about exposure to radiocontrast in the setting of moderate renal insufficiency status post renal artery transplant and apparently the nephrologist felt comfortable proceeding with peripheral angiography potential intervention.   When I saw him in the office 08/21/2021 his right heel wound was poorly healing.  He has 0 vessel runoff below the knee by duplex ultrasound performed 04/02/2021.  I arrange for him to undergo lower extremity angiography by Dr. Fletcher Anon  on 09/11/2021 revealing an occluded right anterior tibial right which reconstituted at the level of the ankle and high-grade posterior tibial artery stenosis.  He underwent shockwave angioplasty followed by balloon angioplasty of the posterior tibial artery.  The majority of the case was done with CO2.  On 09/25/2021 underwent right TMA by Dr. Sharol Given who is following him closely as an outpatient.  Current Meds  Medication Sig   aspirin EC 81 MG tablet Take 1 tablet (81 mg total) by mouth daily. Swallow whole.   atorvastatin (LIPITOR) 10 MG tablet Take 10 mg by mouth daily.   blood glucose meter kit and supplies KIT Dispense based on patient and insurance preference. Use up to four times daily as directed. (FOR ICD-9 250.00, 250.01).   Blood Glucose Monitoring Suppl (ACCU-CHEK GUIDE ME) w/Device KIT 1 Piece by Does not apply route as directed.   brimonidine (ALPHAGAN) 0.2 % ophthalmic solution Place 1 drop into  both eyes 3 (three) times daily.   cloNIDine (CATAPRES - DOSED IN MG/24 HR) 0.3 mg/24hr patch Place 1 patch (0.3 mg total) onto the skin once a week.   clopidogrel (PLAVIX) 75 MG tablet Take 1 tablet (75 mg total) by mouth daily.   dorzolamide-timolol (COSOPT) 22.3-6.8 MG/ML ophthalmic solution Place 1 drop into both eyes 2 (two) times daily.   doxazosin (CARDURA) 4 MG tablet Take 4 mg by mouth daily.   doxycycline  (VIBRA-TABS) 100 MG tablet Take 1 tablet (100 mg total) by mouth 2 (two) times daily.   Dulaglutide (TRULICITY) 4.23 NT/6.1WE SOPN Inject 0.75 mg into the skin once a week. (Patient taking differently: Inject 0.75 mg into the skin every Sunday.)   gabapentin (NEURONTIN) 300 MG capsule Take 1 capsule (300 mg total) by mouth 3 (three) times daily. 3 times a day when necessary neuropathy pain   glucose blood (ACCU-CHEK GUIDE) test strip Use as instructed 4 x daily. E11.65   hydrALAZINE (APRESOLINE) 25 MG tablet Take 25 mg by mouth 2 (two) times daily.   Insulin NPH, Human,, Isophane, (NOVOLIN N FLEXPEN) 100 UNIT/ML Kiwkpen Inject 60 Units into the skin in the morning and at bedtime.   Insulin Regular Human (NOVOLIN R FLEXPEN RELION) 100 UNIT/ML KwikPen Inject 1-9 Units as directed 3 (three) times daily before meals. Sliding scale CBG 70 - 120: 0 units CBG 121 - 150: 1 unit,  CBG 151 - 200: 2 units,  CBG 201 - 250: 3 units,  CBG 251 - 300: 5 units,  CBG 301 - 350: 7 units,  CBG 351 - 400: 9 units   CBG > 400: 9 units and notify your doctor   latanoprost (XALATAN) 0.005 % ophthalmic solution Place 1 drop into both eyes at bedtime.   Multiple Vitamin (MULTIVITAMIN) capsule Take 1 capsule by mouth daily.   mupirocin ointment (BACTROBAN) 2 % Apply 1 application. topically 2 (two) times daily. For wound care   oxyCODONE-acetaminophen (PERCOCET/ROXICET) 5-325 MG tablet Take 1 tablet by mouth every 4 (four) hours as needed.   sodium bicarbonate 650 MG tablet Take 1,300 mg by mouth 3 (three) times daily.   sodium zirconium cyclosilicate (LOKELMA) 10 g PACK packet Take 10 g by mouth daily.   sulfamethoxazole-trimethoprim (BACTRIM DS) 800-160 MG tablet Take 1 tablet by mouth 2 (two) times daily.   tacrolimus (PROGRAF) 1 MG capsule Take 4 capsules (4 mg total) by mouth 2 (two) times daily.   Vitamin D, Ergocalciferol, (DRISDOL) 50000 units CAPS capsule Take 50,000 Units by mouth every 30 (thirty) days.   Current  Facility-Administered Medications for the 11/14/21 encounter (Office Visit) with Lorretta Harp, MD  Medication   sodium chloride flush (NS) 0.9 % injection 3 mL     No Known Allergies  Social History   Socioeconomic History   Marital status: Married    Spouse name: Not on file   Number of children: Not on file   Years of education: Not on file   Highest education level: Not on file  Occupational History   Not on file  Tobacco Use   Smoking status: Never   Smokeless tobacco: Never  Vaping Use   Vaping Use: Never used  Substance and Sexual Activity   Alcohol use: No    Alcohol/week: 0.0 standard drinks of alcohol   Drug use: No   Sexual activity: Not on file  Other Topics Concern   Not on file  Social History Narrative   ** Merged History  Encounter **       Social Determinants of Health   Financial Resource Strain: Not on file  Food Insecurity: Not on file  Transportation Needs: Not on file  Physical Activity: Not on file  Stress: Not on file  Social Connections: Not on file  Intimate Partner Violence: Not on file     Review of Systems: General: negative for chills, fever, night sweats or weight changes.  Cardiovascular: negative for chest pain, dyspnea on exertion, edema, orthopnea, palpitations, paroxysmal nocturnal dyspnea or shortness of breath Dermatological: negative for rash Respiratory: negative for cough or wheezing Urologic: negative for hematuria Abdominal: negative for nausea, vomiting, diarrhea, bright red blood per rectum, melena, or hematemesis Neurologic: negative for visual changes, syncope, or dizziness All other systems reviewed and are otherwise negative except as noted above.    Blood pressure 118/66, pulse 72, height _0  (1.702 m), weight 214 lb (97.1 kg).  General appearance: alert and no distress Neck: no adenopathy, no carotid bruit, no JVD, supple, symmetrical, trachea midline, and thyroid not enlarged, symmetric, no  tenderness/mass/nodules Lungs: clear to auscultation bilaterally Heart: regular rate and rhythm, S1, S2 normal, no murmur, click, rub or gallop Extremities: extremities normal, atraumatic, no cyanosis or edema Pulses: Diminished pedal pulses Skin: Right foot is wrapped Neurologic: Grossly normal  EKG not performed today  ASSESSMENT AND PLAN:   Essential hypertension, benign History of essential hypertension a blood pressure measured today at 118/66.  He is on Catapres patch, and hydralazine.  Mixed hyperlipidemia History of hyperlipidemia on statin therapy with lipid profile performed 08/23/2021 revealing total cholesterol 104, LDL 46 and HDL 38.  Critical lower limb ischemia History of critical ischemia with a right foot diabetic ulcer.  He underwent peripheral angiography by Dr. Meryle Ready he is 09/11/2021 revealing an occluded right anterior tibial that filled by collaterals at the level of the ankle and a high-grade calcified lesion in the posterior tibial.  He underwent intravascular lithotripsy and balloon angioplasty with excellent result.  On 5/3 he underwent right transmetatarsal amputation by Dr. Sharol Given who is following closely as an outpatient     Lorretta Harp MD [facility], [facility] 11/14/2021 11:19 AM

## 2021-11-14 NOTE — Assessment & Plan Note (Signed)
History of hyperlipidemia on statin therapy with lipid profile performed 08/23/2021 revealing total cholesterol 104, LDL 46 and HDL 38. 

## 2021-11-14 NOTE — Assessment & Plan Note (Signed)
History of critical ischemia with a right foot diabetic ulcer.  He underwent peripheral angiography by Dr. Meryle Ready he is 09/11/2021 revealing an occluded right anterior tibial that filled by collaterals at the level of the ankle and a high-grade calcified lesion in the posterior tibial.  He underwent intravascular lithotripsy and balloon angioplasty with excellent result.  On 5/3 he underwent right transmetatarsal amputation by Dr. Sharol Given who is following closely as an outpatient

## 2021-11-15 ENCOUNTER — Encounter (HOSPITAL_COMMUNITY): Payer: Self-pay | Admitting: Orthopedic Surgery

## 2021-11-15 ENCOUNTER — Encounter: Payer: Self-pay | Admitting: Orthopedic Surgery

## 2021-11-15 ENCOUNTER — Encounter (HOSPITAL_COMMUNITY): Payer: Self-pay

## 2021-11-15 ENCOUNTER — Inpatient Hospital Stay (HOSPITAL_COMMUNITY)
Admission: EM | Admit: 2021-11-15 | Discharge: 2021-11-19 | DRG: 463 | Disposition: A | Discharge status: Home Health | Payer: 59 | Attending: Internal Medicine | Admitting: Internal Medicine

## 2021-11-15 ENCOUNTER — Other Ambulatory Visit: Payer: Self-pay

## 2021-11-15 ENCOUNTER — Ambulatory Visit (INDEPENDENT_AMBULATORY_CARE_PROVIDER_SITE_OTHER): Payer: 59 | Admitting: Orthopedic Surgery

## 2021-11-15 DIAGNOSIS — Z6833 Body mass index (BMI) 33.0-33.9, adult: Secondary | ICD-10-CM

## 2021-11-15 DIAGNOSIS — E871 Hypo-osmolality and hyponatremia: Secondary | ICD-10-CM | POA: Diagnosis present

## 2021-11-15 DIAGNOSIS — M8618 Other acute osteomyelitis, other site: Secondary | ICD-10-CM | POA: Diagnosis present

## 2021-11-15 DIAGNOSIS — Z7902 Long term (current) use of antithrombotics/antiplatelets: Secondary | ICD-10-CM

## 2021-11-15 DIAGNOSIS — K219 Gastro-esophageal reflux disease without esophagitis: Secondary | ICD-10-CM | POA: Diagnosis present

## 2021-11-15 DIAGNOSIS — L02611 Cutaneous abscess of right foot: Secondary | ICD-10-CM

## 2021-11-15 DIAGNOSIS — Y835 Amputation of limb(s) as the cause of abnormal reaction of the patient, or of later complication, without mention of misadventure at the time of the procedure: Secondary | ICD-10-CM | POA: Diagnosis present

## 2021-11-15 DIAGNOSIS — Z940 Kidney transplant status: Secondary | ICD-10-CM

## 2021-11-15 DIAGNOSIS — E43 Unspecified severe protein-calorie malnutrition: Secondary | ICD-10-CM | POA: Diagnosis present

## 2021-11-15 DIAGNOSIS — E1122 Type 2 diabetes mellitus with diabetic chronic kidney disease: Secondary | ICD-10-CM

## 2021-11-15 DIAGNOSIS — I10 Essential (primary) hypertension: Secondary | ICD-10-CM | POA: Diagnosis present

## 2021-11-15 DIAGNOSIS — E669 Obesity, unspecified: Secondary | ICD-10-CM | POA: Diagnosis present

## 2021-11-15 DIAGNOSIS — N183 Chronic kidney disease, stage 3 unspecified: Secondary | ICD-10-CM | POA: Diagnosis present

## 2021-11-15 DIAGNOSIS — E1151 Type 2 diabetes mellitus with diabetic peripheral angiopathy without gangrene: Secondary | ICD-10-CM | POA: Diagnosis present

## 2021-11-15 DIAGNOSIS — Z833 Family history of diabetes mellitus: Secondary | ICD-10-CM

## 2021-11-15 DIAGNOSIS — Z8249 Family history of ischemic heart disease and other diseases of the circulatory system: Secondary | ICD-10-CM

## 2021-11-15 DIAGNOSIS — E1140 Type 2 diabetes mellitus with diabetic neuropathy, unspecified: Secondary | ICD-10-CM | POA: Diagnosis present

## 2021-11-15 DIAGNOSIS — Z794 Long term (current) use of insulin: Secondary | ICD-10-CM

## 2021-11-15 DIAGNOSIS — Z885 Allergy status to narcotic agent status: Secondary | ICD-10-CM

## 2021-11-15 DIAGNOSIS — I129 Hypertensive chronic kidney disease with stage 1 through stage 4 chronic kidney disease, or unspecified chronic kidney disease: Secondary | ICD-10-CM | POA: Diagnosis present

## 2021-11-15 DIAGNOSIS — E872 Acidosis, unspecified: Secondary | ICD-10-CM | POA: Diagnosis present

## 2021-11-15 DIAGNOSIS — E1152 Type 2 diabetes mellitus with diabetic peripheral angiopathy with gangrene: Secondary | ICD-10-CM | POA: Diagnosis present

## 2021-11-15 DIAGNOSIS — T8743 Infection of amputation stump, right lower extremity: Secondary | ICD-10-CM | POA: Diagnosis not present

## 2021-11-15 DIAGNOSIS — I70261 Atherosclerosis of native arteries of extremities with gangrene, right leg: Secondary | ICD-10-CM | POA: Diagnosis present

## 2021-11-15 DIAGNOSIS — E11621 Type 2 diabetes mellitus with foot ulcer: Secondary | ICD-10-CM | POA: Diagnosis not present

## 2021-11-15 DIAGNOSIS — Z7985 Long-term (current) use of injectable non-insulin antidiabetic drugs: Secondary | ICD-10-CM

## 2021-11-15 DIAGNOSIS — E782 Mixed hyperlipidemia: Secondary | ICD-10-CM | POA: Diagnosis present

## 2021-11-15 DIAGNOSIS — E119 Type 2 diabetes mellitus without complications: Secondary | ICD-10-CM

## 2021-11-15 DIAGNOSIS — L97819 Non-pressure chronic ulcer of other part of right lower leg with unspecified severity: Secondary | ICD-10-CM | POA: Diagnosis present

## 2021-11-15 DIAGNOSIS — E11628 Type 2 diabetes mellitus with other skin complications: Secondary | ICD-10-CM | POA: Diagnosis present

## 2021-11-15 DIAGNOSIS — Z79899 Other long term (current) drug therapy: Secondary | ICD-10-CM

## 2021-11-15 DIAGNOSIS — T8789 Other complications of amputation stump: Secondary | ICD-10-CM | POA: Diagnosis present

## 2021-11-15 DIAGNOSIS — E11622 Type 2 diabetes mellitus with other skin ulcer: Secondary | ICD-10-CM | POA: Diagnosis present

## 2021-11-15 DIAGNOSIS — E08621 Diabetes mellitus due to underlying condition with foot ulcer: Secondary | ICD-10-CM

## 2021-11-15 DIAGNOSIS — Z7982 Long term (current) use of aspirin: Secondary | ICD-10-CM

## 2021-11-15 DIAGNOSIS — M1990 Unspecified osteoarthritis, unspecified site: Secondary | ICD-10-CM | POA: Diagnosis present

## 2021-11-15 DIAGNOSIS — S88111A Complete traumatic amputation at level between knee and ankle, right lower leg, initial encounter: Secondary | ICD-10-CM

## 2021-11-15 DIAGNOSIS — Z89431 Acquired absence of right foot: Secondary | ICD-10-CM

## 2021-11-15 DIAGNOSIS — Z8614 Personal history of Methicillin resistant Staphylococcus aureus infection: Secondary | ICD-10-CM

## 2021-11-15 DIAGNOSIS — L089 Local infection of the skin and subcutaneous tissue, unspecified: Secondary | ICD-10-CM | POA: Diagnosis present

## 2021-11-15 DIAGNOSIS — Z881 Allergy status to other antibiotic agents status: Secondary | ICD-10-CM

## 2021-11-15 DIAGNOSIS — D509 Iron deficiency anemia, unspecified: Secondary | ICD-10-CM | POA: Diagnosis present

## 2021-11-15 LAB — CBC WITH DIFFERENTIAL/PLATELET
Abs Immature Granulocytes: 0.08 10*3/uL — ABNORMAL HIGH (ref 0.00–0.07)
Basophils Absolute: 0 10*3/uL (ref 0.0–0.1)
Basophils Relative: 0 %
Eosinophils Absolute: 0.1 10*3/uL (ref 0.0–0.5)
Eosinophils Relative: 1 %
HCT: 31.2 % — ABNORMAL LOW (ref 39.0–52.0)
Hemoglobin: 9.4 g/dL — ABNORMAL LOW (ref 13.0–17.0)
Immature Granulocytes: 1 %
Lymphocytes Relative: 26 %
Lymphs Abs: 1.9 10*3/uL (ref 0.7–4.0)
MCH: 22.8 pg — ABNORMAL LOW (ref 26.0–34.0)
MCHC: 30.1 g/dL (ref 30.0–36.0)
MCV: 75.5 fL — ABNORMAL LOW (ref 80.0–100.0)
Monocytes Absolute: 0.8 10*3/uL (ref 0.1–1.0)
Monocytes Relative: 10 %
Neutro Abs: 4.6 10*3/uL (ref 1.7–7.7)
Neutrophils Relative %: 62 %
Platelets: 245 10*3/uL (ref 150–400)
RBC: 4.13 MIL/uL — ABNORMAL LOW (ref 4.22–5.81)
RDW: 17.2 % — ABNORMAL HIGH (ref 11.5–15.5)
WBC: 7.5 10*3/uL (ref 4.0–10.5)
nRBC: 0 % (ref 0.0–0.2)

## 2021-11-15 LAB — BASIC METABOLIC PANEL
Anion gap: 9 (ref 5–15)
BUN: 38 mg/dL — ABNORMAL HIGH (ref 8–23)
CO2: 22 mmol/L (ref 22–32)
Calcium: 9.5 mg/dL (ref 8.9–10.3)
Chloride: 101 mmol/L (ref 98–111)
Creatinine, Ser: 1.71 mg/dL — ABNORMAL HIGH (ref 0.61–1.24)
GFR, Estimated: 45 mL/min — ABNORMAL LOW (ref >60)
Glucose, Bld: 429 mg/dL — ABNORMAL HIGH (ref 70–99)
Potassium: 5.1 mmol/L (ref 3.5–5.1)
Sodium: 132 mmol/L — ABNORMAL LOW (ref 135–145)

## 2021-11-15 MED ORDER — LINEZOLID 600 MG/300ML IV SOLN
600.0000 mg | Freq: Two times a day (BID) | INTRAVENOUS | Status: AC
Start: 2021-11-15 — End: 2021-11-17
  Administered 2021-11-16 (×2): 600 mg via INTRAVENOUS
  Filled 2021-11-15 (×4): qty 300

## 2021-11-15 MED ORDER — VANCOMYCIN HCL IN DEXTROSE 1-5 GM/200ML-% IV SOLN: 1000.0000 mg | INTRAVENOUS | Status: DC

## 2021-11-15 MED ORDER — DIPHENHYDRAMINE HCL 50 MG/ML IJ SOLN
25.0000 mg | Freq: Once | INTRAMUSCULAR | Status: AC
  Administered 2021-11-16: 25 mg via INTRAVENOUS
  Filled 2021-11-15: qty 1

## 2021-11-15 MED ORDER — VANCOMYCIN HCL 2000 MG/400ML IV SOLN
2000.0000 mg | Freq: Once | INTRAVENOUS | Status: AC
Start: 2021-11-15 — End: 2021-11-15
  Administered 2021-11-15: 2000 mg via INTRAVENOUS
  Filled 2021-11-15: qty 400

## 2021-11-15 MED ORDER — SODIUM CHLORIDE 0.9 % IV SOLN
2.0000 g | Freq: Once | INTRAVENOUS | Status: AC
  Administered 2021-11-15: 2 g via INTRAVENOUS
  Filled 2021-11-15 (×2): qty 12.5

## 2021-11-15 MED ORDER — METRONIDAZOLE 500 MG/100ML IV SOLN
500.0000 mg | Freq: Two times a day (BID) | INTRAVENOUS | Status: DC
  Administered 2021-11-15: 500 mg via INTRAVENOUS
  Filled 2021-11-15: qty 100

## 2021-11-15 NOTE — ED Provider Triage Note (Signed)
Emergency Medicine Provider Triage Evaluation Note  Haki Passley , a 61 y.o. male  was evaluated in triage.  Pt complains of abscess over the left foot.  He is scheduled for BKA tomorrow morning.  He was sent by Dr. Lajoyce Corners with orthopedics for IV antibiotics and admission.  Patient denies fevers or chills.  Review of Systems  Positive:  Negative: See above  Physical Exam  BP 133/73   Pulse 80   Temp 98.8 F (37.1 C) (Oral)   Resp 16   SpO2 97%  Gen:   Awake, no distress   Resp:  Normal effort  MSK:   Moves extremities without difficulty  Other:    Medical Decision Making  Medically screening exam initiated at 9:45 PM.  Appropriate orders placed.  Alhassan Pangburn was informed that the remainder of the evaluation will be completed by another provider, this initial triage assessment does not replace that evaluation, and the importance of remaining in the ED until their evaluation is complete.  Labs and IV antibiotics ordered. After labs result patient can be admitted to hospitalist service.    Honor Loh Oran, [place] 11/15/21 2147

## 2021-11-15 NOTE — Anesthesia Preprocedure Evaluation (Addendum)
Anesthesia Evaluation  Patient identified by MRN, date of birth, ID band Patient awake    Reviewed: Allergy & Precautions, NPO status , Patient's Chart, lab work & pertinent test results  Airway Mallampati: II  TM Distance: >3 FB Neck ROM: Full    Dental  (+) Dental Advisory Given, Poor Dentition   Pulmonary neg pulmonary ROS,    Pulmonary exam normal breath sounds clear to auscultation       Cardiovascular hypertension, Pt. on medications + Peripheral Vascular Disease  Normal cardiovascular exam Rhythm:Regular Rate:Normal     Neuro/Psych negative neurological ROS     GI/Hepatic Neg liver ROS, GERD  ,  Endo/Other  diabetes, Poorly Controlled, Type 2, Insulin DependentObesity   Renal/GU Renal InsufficiencyRenal diseaseESRD 2012-->s/p renal transplant 03/09/15     Musculoskeletal  (+) Arthritis , s/p transmetatarsal amputation, vertical midline incision with purulent drainage present   Abdominal   Peds  Hematology  (+) Blood dyscrasia (Plavix), anemia ,   Anesthesia Other Findings Day of surgery medications reviewed with the patient.  Reproductive/Obstetrics                            Anesthesia Physical Anesthesia Plan  ASA: 3  Anesthesia Plan: Regional   Post-op Pain Management: Regional block* and Tylenol PO (pre-op)*   Induction: Intravenous  PONV Risk Score and Plan: 1 and TIVA and Treatment may vary due to age or medical condition  Airway Management Planned: Nasal Cannula and Natural Airway  Additional Equipment:   Intra-op Plan:   Post-operative Plan:   Informed Consent: I have reviewed the patients History and Physical, chart, labs and discussed the procedure including the risks, benefits and alternatives for the proposed anesthesia with the patient or authorized representative who has indicated his/her understanding and acceptance.     Dental advisory given  Plan  Discussed with: CRNA  Anesthesia Plan Comments: (PAT note written 11/15/2021 by Shonna Chock, PA-C. )       Anesthesia Quick Evaluation

## 2021-11-15 NOTE — Progress Notes (Signed)
Anesthesia Chart Review: Raymond [name]  Case: [id number] Date/Time: 11/16/21 0933   Procedure: RIGHT AMPUTATION BELOW KNEE (Right: Knee)   Anesthesia type: General   Pre-op diagnosis: Right Foot Abscess   [facility]   Surgeons: Raymond Mustard, MD       DISCUSSION: Patient is a 61 year old male scheduled for the above procedure. S/p right foot transmetatarsal amputation on 09/25/2021.  He had follow-up with Dr. Lajoyce [name] on 11/15/2021.  He has had acute purulent drainage despite dressing changes and doxycycline.  He recommended patient go to the ED for IV antibiotics and plan for transtibial amputation on 11/16/2021.  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  Raymond [name] is his cardiologist. Last visit 11/14/21,note reviewed. Has been primarily followed for HTN, HLD, and PVD. S/p angioplasty right PT artery 09/11/21 by Dr. Lorine [name].   A1c 10.4% 08/14/21.  Anesthesia team to evaluate on the day of surgery. Last Plavix dose is documented as 11/13/21.    VS:  BP Readings from Last 3 Encounters:  11/14/21 118/66  09/25/21 (!) 162/83  09/11/21 (!) 125/93   Pulse Readings from Last 3 Encounters:  11/14/21 72  09/25/21 66  09/11/21 76     PROVIDERS: Raymond Felts, FNP is PCP  Raymond Batty, MD is Raymond Bears, MD is PV cardiologist Raymond Belling, MD is endocrinologist. Last visit 03/25/21. Raymond Burly, DO is ID (consult) Transplant nephrologist is in [facility], [phone], 11/05/20 note by Raymond Cargo, MD. Primary nephrologist Raymond Hutching, MD. Transplant surgeon Raymond Loser, MD.   LABS: For day of surgery as indicated. Last lab results include: Lab Results  Component  Value Date   WBC 5.0 08/30/2021   HGB 9.8 (L) 08/30/2021   HCT 32.9 (L) 08/30/2021   PLT 359 08/30/2021   GLUCOSE 295 (H) 09/09/2021   ALT 14 08/29/2021   AST 13 (L) 08/29/2021   NA 138 09/09/2021   K 5.2 09/09/2021   CL 102 09/09/2021   CREATININE 1.03 09/09/2021   BUN 22 09/09/2021   CO2 24 09/09/2021   HGBA1C 10.4 (H) 08/14/2021     EKG: EKG 08/28/21: Right and left arm electrode reversal, interpretation assumes no reversal Sinus rhythm Right ventricular hypertrophy Probable lateral infarct, age indeterminate Borderline ST elevation, anterior leads Confirmed by Alona Bene [phone]) on 08/28/2021 2:59:47 PM  EKG 07/25/21: Right and left arm electrode reversal, interpretation assumes no reversal Sinus rhythm Right ventricular hypertrophy Probable lateral infarct, age indeterminate Borderline ST elevation, anterior leads Confirmed by Alona Bene [phone]) on 08/28/2021 2:59:47 PM   CV: TEE 01/17/20: IMPRESSIONS   1. Left ventricular ejection fraction, by estimation, is 55 to 60%. The  left ventricle has normal function. The left ventricle has no regional  wall motion abnormalities.   2. Right ventricular systolic function is normal. The right ventricular  size is normal.   3. Left atrial size was mildly dilated. No left atrial/left atrial  appendage thrombus was detected. The LAA emptying velocity was 90 cm/s.   4. Right atrial size was mildly dilated.   5. The mitral valve is normal in structure. Trivial mitral valve  regurgitation. No evidence of mitral stenosis.   6. The aortic valve is tricuspid. Aortic valve regurgitation is  not  visualized. No aortic stenosis is present.  - Conclusion(s)/Recommendation(s): No evidence of vegetation/infective  endocarditis on this transesophageal  echocardiogram.   TTE 01/14/20: IMPRESSIONS   1. Left ventricular ejection fraction, by estimation, is 55 to 60%. The  left ventricle has normal function. The left ventricle has no  regional  wall motion abnormalities. There is moderate left ventricular hypertrophy.  Left ventricular diastolic  parameters are indeterminate.   2. Right ventricular systolic function is normal. The right ventricular  size is normal. There is normal pulmonary artery systolic pressure.   3. The mitral valve is normal in structure. No evidence of mitral valve  regurgitation.   4. The aortic valve is tricuspid. Aortic valve regurgitation is not  visualized. Mild aortic valve sclerosis is present, with no evidence of  aortic valve stenosis.   5. The inferior vena cava is normal in size with greater than 50%  respiratory variability, suggesting right atrial pressure of 3 mmHg.    Past Medical History:  Diagnosis Date   Diabetes (HCC)    Diabetes mellitus without complication (HCC)    type 2   ESRD (end stage renal disease) (HCC)    03/09/2015- patient had a kidney transplant   Hypertension    Peripheral vascular disease (HCC)    Renal disorder    Renal insufficiency     Past Surgical History:  Procedure Laterality Date   ABDOMINAL AORTOGRAM W/LOWER EXTREMITY N/A 09/11/2021   Procedure: ABDOMINAL AORTOGRAM W/LOWER EXTREMITY;  Surgeon: Raymond Ouch, MD;  [facility];  Service: Cardiovascular;  Laterality: N/A;   AMPUTATION Right 09/25/2021   Procedure: RIGHT TRANSMETATARSAL AMPUTATION AND APPLY TISSUE GRAFT;  Surgeon: Raymond Mustard, MD;  Location: [facility] OR;  Service: Orthopedics;  Laterality: Right;   BONE BIOPSY Right 03/06/2021   Procedure: BONE BIOPSY;  Surgeon: Raymond [name], DPM;  Location: [facility] ORS;  Service: Podiatry;  Laterality: Right;   CENTRAL VENOUS CATHETER INSERTION Right 01/20/2020   Procedure: INSERTION CENTRAL LINE ADULT; Tunneled central line;  Surgeon: Raymond Roers, MD;  Location: [facility] ORS;  Service: General;  Laterality: Right;   COLONOSCOPY N/A 12/05/2014   [id number] external and internal hemorrhoid/mild diverticulosis/11 polyps removed    ESOPHAGOGASTRODUODENOSCOPY N/A 12/05/2014   [id number] duodenitis   GRAFT APPLICATION Right 03/06/2021   Procedure: GRAFT APPLICATION;  Surgeon: Raymond [name], DPM;  Location: [facility]S;  Service: Podiatry;  Laterality: Right;   INCISION AND DRAINAGE OF WOUND Right 08/15/2021   Procedure: IRRIGATION AND DEBRIDEMENT WOUND;  Surgeon: Asencion Islam, DPM;  Location: [facility]S;  Service: Podiatry;  Laterality: Right;  need to make card not  a irrigation and debridement card   IR FLUORO GUIDE CV LINE RIGHT  03/01/2019   IR PERC TUN PERIT CATH WO PORT S&I /IMAG  06/27/2021   IR REMOVAL TUN CV CATH W/O FL  05/10/2019   IR REMOVAL TUN CV CATH W/O FL  02/29/2020   IR REMOVAL TUN CV CATH W/O FL  07/19/2021   IR US GUIDE VASC ACCESS RIGHT  03/01/2019   IR US GUIDE VASC ACCESS RIGHT  06/27/2021   IRRIGATION AND DEBRIDEMENT ELBOW Left 06/24/2021   Procedure: IRRIGATION AND DEBRIDEMENT ELBOW;  Surgeon: Lyndle Herrlich, MD;  Location: [facility] ORS;  Service: Orthopedics;  Laterality: Left;   KIDNEY TRANSPLANT  03/27/2015   Penile Pump Insertion     PERIPHERAL VASCULAR ATHERECTOMY  09/11/2021   Procedure: PERIPHERAL VASCULAR ATHERECTOMY;  Surgeon: Raymond Ouch, MD;  [facility];  Service: Cardiovascular;;  Shockwave Lithotripsy   TEE WITHOUT CARDIOVERSION N/A 01/17/2020   Procedure: TRANSESOPHAGEAL ECHOCARDIOGRAM (TEE) WITH PROPOFOL;  Surgeon: Antoine Poche, MD;  [facility];  Service: Endoscopy;  Laterality: N/A;   WOUND DEBRIDEMENT Right 03/06/2021   Procedure: DEBRIDEMENT WOUND;  Surgeon: Raymond [name], DPM;  Location: [facility]S;  Service: Podiatry;  Laterality: Right;    MEDICATIONS:  sodium chloride flush (NS) 0.9 % injection 3 mL    aspirin EC 81 MG tablet   atorvastatin (LIPITOR) 10 MG tablet   blood glucose meter kit and supplies KIT   Blood Glucose Monitoring Suppl (ACCU-CHEK GUIDE ME) w/Device KIT   brimonidine (ALPHAGAN) 0.2 % ophthalmic solution   cloNIDine  (CATAPRES - DOSED IN MG/24 HR) 0.3 mg/24hr patch   clopidogrel (PLAVIX) 75 MG tablet   dorzolamide-timolol (COSOPT) 22.3-6.8 MG/ML ophthalmic solution   doxazosin (CARDURA) 4 MG tablet   doxycycline (VIBRA-TABS) 100 MG tablet   Dulaglutide (TRULICITY) 0.75 MG/0.5ML SOPN   gabapentin (NEURONTIN) 300 MG capsule   glucose blood (ACCU-CHEK GUIDE) test strip   hydrALAZINE (APRESOLINE) 25 MG tablet   Insulin NPH, Human,, Isophane, (NOVOLIN N FLEXPEN) 100 UNIT/ML Kiwkpen   Insulin Regular Human (NOVOLIN R FLEXPEN RELION) 100 UNIT/ML KwikPen   latanoprost (XALATAN) 0.005 % ophthalmic solution   Multiple Vitamin (MULTIVITAMIN) capsule   mupirocin ointment (BACTROBAN) 2 %   oxyCODONE-acetaminophen (PERCOCET/ROXICET) 5-325 MG tablet   sodium bicarbonate 650 MG tablet   sodium zirconium cyclosilicate (LOKELMA) 10 g PACK packet   sulfamethoxazole-trimethoprim (BACTRIM DS) 800-160 MG tablet   tacrolimus (PROGRAF) 1 MG capsule   Vitamin D, Ergocalciferol, (DRISDOL) 50000 units CAPS capsule    Shonna Chock, PA-C Surgical Short Stay/Anesthesiology [facility] Phone [phone] [facility] Phone [phone] 11/15/2021 4:59 PM

## 2021-11-15 NOTE — Progress Notes (Signed)
PCP - [name], FNP Cardiologist - Dr Nanetta Batty Endocrinology - Dr Romero Belling Infectious Diseases - Dr Lynn Ito  Chest x-ray - Dr Nanetta Batty EKG - 08/28/21 Stress Test - Greater than 3 yrs ECHO TEE- 01/17/20 Cardiac Cath - n/a  ICD Pacemaker/Loop - n/a  Sleep Study -  n/a CPAP - none  THE NIGHT BEFORE SURGERY, take 30 Units of Novolin Insulin.       THE MORNING OF SURGERY, do not take Regular Insulin unless your CBG is greater than 220 mg/dL.  If CBG greater than 220 mg/dL, you may take  of your sliding scale (correction) dose of insulin.  If your blood sugar is less than 70 mg/dL, you will need to treat for low blood sugar: Treat a low blood sugar (less than 70 mg/dL) with  cup of clear juice (cranberry or apple), 4 glucose tablets, OR glucose gel. Recheck blood sugar in 15 minutes after treatment (to make sure it is greater than 70 mg/dL). If your blood sugar is not greater than 70 mg/dL on recheck, call [phone] for further instructions.  Blood Thinner Instructions:  Follow your surgeon's instructions on when to stop Plavix prior to surgery. Last dose was on 11/13/21.  Aspirin Instructions: Follow your surgeon's instructions on when to stop aspirin prior to surgery,  If no instructions were given by your surgeon then you will need to call the office for those instructions.  Last dose was on 11/14/21.  Anesthesia review: Yes  STOP now taking any Aspirin (unless otherwise instructed by your surgeon), Aleve, Naproxen, Ibuprofen, Motrin, Advil, Goody's, BC's, all herbal medications, fish oil, and all vitamins.   Coronavirus Screening Do you have any of the following symptoms:  Cough yes/no: No Fever (>100.59F)  yes/no: No Runny nose yes/no: No Sore throat yes/no: No Difficulty breathing/shortness of breath  yes/no: No  Have you traveled in the last 14 days and where? yes/no: No  Patient verbalized understanding of instructions that were given via  phone.

## 2021-11-16 ENCOUNTER — Encounter (HOSPITAL_COMMUNITY): Payer: Self-pay | Admitting: Internal Medicine

## 2021-11-16 ENCOUNTER — Inpatient Hospital Stay (HOSPITAL_COMMUNITY): Payer: 59 | Admitting: Vascular Surgery

## 2021-11-16 ENCOUNTER — Other Ambulatory Visit: Payer: Self-pay

## 2021-11-16 ENCOUNTER — Encounter (HOSPITAL_COMMUNITY)
Admission: EM | Disposition: A | Discharge status: Home Health | Payer: Self-pay | Source: Home / Self Care | Attending: Internal Medicine

## 2021-11-16 ENCOUNTER — Inpatient Hospital Stay (HOSPITAL_COMMUNITY): Admission: RE | Admit: 2021-11-16 | Payer: 59 | Source: Ambulatory Visit | Admitting: Orthopedic Surgery

## 2021-11-16 DIAGNOSIS — E11628 Type 2 diabetes mellitus with other skin complications: Secondary | ICD-10-CM

## 2021-11-16 DIAGNOSIS — G8918 Other acute postprocedural pain: Secondary | ICD-10-CM | POA: Diagnosis not present

## 2021-11-16 DIAGNOSIS — L02611 Cutaneous abscess of right foot: Secondary | ICD-10-CM

## 2021-11-16 DIAGNOSIS — I10 Essential (primary) hypertension: Secondary | ICD-10-CM

## 2021-11-16 DIAGNOSIS — E1151 Type 2 diabetes mellitus with diabetic peripheral angiopathy without gangrene: Secondary | ICD-10-CM | POA: Diagnosis not present

## 2021-11-16 DIAGNOSIS — E11622 Type 2 diabetes mellitus with other skin ulcer: Secondary | ICD-10-CM | POA: Diagnosis not present

## 2021-11-16 DIAGNOSIS — Z940 Kidney transplant status: Secondary | ICD-10-CM

## 2021-11-16 DIAGNOSIS — I120 Hypertensive chronic kidney disease with stage 5 chronic kidney disease or end stage renal disease: Secondary | ICD-10-CM

## 2021-11-16 DIAGNOSIS — E782 Mixed hyperlipidemia: Secondary | ICD-10-CM

## 2021-11-16 DIAGNOSIS — E43 Unspecified severe protein-calorie malnutrition: Secondary | ICD-10-CM | POA: Diagnosis not present

## 2021-11-16 DIAGNOSIS — I70261 Atherosclerosis of native arteries of extremities with gangrene, right leg: Secondary | ICD-10-CM | POA: Diagnosis not present

## 2021-11-16 DIAGNOSIS — T8743 Infection of amputation stump, right lower extremity: Secondary | ICD-10-CM | POA: Diagnosis not present

## 2021-11-16 DIAGNOSIS — Z885 Allergy status to narcotic agent status: Secondary | ICD-10-CM | POA: Diagnosis not present

## 2021-11-16 DIAGNOSIS — T8789 Other complications of amputation stump: Secondary | ICD-10-CM | POA: Diagnosis not present

## 2021-11-16 DIAGNOSIS — L089 Local infection of the skin and subcutaneous tissue, unspecified: Secondary | ICD-10-CM

## 2021-11-16 DIAGNOSIS — N186 End stage renal disease: Secondary | ICD-10-CM

## 2021-11-16 DIAGNOSIS — E1122 Type 2 diabetes mellitus with diabetic chronic kidney disease: Secondary | ICD-10-CM | POA: Diagnosis not present

## 2021-11-16 DIAGNOSIS — Z881 Allergy status to other antibiotic agents status: Secondary | ICD-10-CM | POA: Diagnosis not present

## 2021-11-16 DIAGNOSIS — Z794 Long term (current) use of insulin: Secondary | ICD-10-CM

## 2021-11-16 DIAGNOSIS — E872 Acidosis, unspecified: Secondary | ICD-10-CM | POA: Diagnosis not present

## 2021-11-16 DIAGNOSIS — E871 Hypo-osmolality and hyponatremia: Secondary | ICD-10-CM | POA: Diagnosis not present

## 2021-11-16 DIAGNOSIS — N185 Chronic kidney disease, stage 5: Secondary | ICD-10-CM | POA: Diagnosis not present

## 2021-11-16 DIAGNOSIS — L97819 Non-pressure chronic ulcer of other part of right lower leg with unspecified severity: Secondary | ICD-10-CM | POA: Diagnosis not present

## 2021-11-16 DIAGNOSIS — E669 Obesity, unspecified: Secondary | ICD-10-CM | POA: Diagnosis not present

## 2021-11-16 DIAGNOSIS — Z89431 Acquired absence of right foot: Secondary | ICD-10-CM | POA: Diagnosis not present

## 2021-11-16 DIAGNOSIS — Y835 Amputation of limb(s) as the cause of abnormal reaction of the patient, or of later complication, without mention of misadventure at the time of the procedure: Secondary | ICD-10-CM | POA: Diagnosis not present

## 2021-11-16 DIAGNOSIS — E1152 Type 2 diabetes mellitus with diabetic peripheral angiopathy with gangrene: Secondary | ICD-10-CM | POA: Diagnosis not present

## 2021-11-16 DIAGNOSIS — Z6833 Body mass index (BMI) 33.0-33.9, adult: Secondary | ICD-10-CM | POA: Diagnosis not present

## 2021-11-16 DIAGNOSIS — M86161 Other acute osteomyelitis, right tibia and fibula: Secondary | ICD-10-CM | POA: Diagnosis not present

## 2021-11-16 DIAGNOSIS — E1140 Type 2 diabetes mellitus with diabetic neuropathy, unspecified: Secondary | ICD-10-CM | POA: Diagnosis not present

## 2021-11-16 DIAGNOSIS — M8618 Other acute osteomyelitis, other site: Secondary | ICD-10-CM | POA: Diagnosis not present

## 2021-11-16 DIAGNOSIS — L97919 Non-pressure chronic ulcer of unspecified part of right lower leg with unspecified severity: Secondary | ICD-10-CM | POA: Diagnosis not present

## 2021-11-16 DIAGNOSIS — D509 Iron deficiency anemia, unspecified: Secondary | ICD-10-CM | POA: Diagnosis not present

## 2021-11-16 DIAGNOSIS — S88111A Complete traumatic amputation at level between knee and ankle, right lower leg, initial encounter: Secondary | ICD-10-CM | POA: Diagnosis not present

## 2021-11-16 HISTORY — PX: AMPUTATION: SHX166

## 2021-11-16 LAB — BASIC METABOLIC PANEL
Anion gap: 11 (ref 5–15)
BUN: 39 mg/dL — ABNORMAL HIGH (ref 8–23)
CO2: 17 mmol/L — ABNORMAL LOW (ref 22–32)
Calcium: 9.3 mg/dL (ref 8.9–10.3)
Chloride: 104 mmol/L (ref 98–111)
Creatinine, Ser: 1.67 mg/dL — ABNORMAL HIGH (ref 0.61–1.24)
GFR, Estimated: 46 mL/min — ABNORMAL LOW (ref >60)
Glucose, Bld: 432 mg/dL — ABNORMAL HIGH (ref 70–99)
Potassium: 4.3 mmol/L (ref 3.5–5.1)
Sodium: 132 mmol/L — ABNORMAL LOW (ref 135–145)

## 2021-11-16 LAB — CBC
HCT: 29.8 % — ABNORMAL LOW (ref 39.0–52.0)
Hemoglobin: 9 g/dL — ABNORMAL LOW (ref 13.0–17.0)
MCH: 22.8 pg — ABNORMAL LOW (ref 26.0–34.0)
MCHC: 30.2 g/dL (ref 30.0–36.0)
MCV: 75.4 fL — ABNORMAL LOW (ref 80.0–100.0)
Platelets: 212 10*3/uL (ref 150–400)
RBC: 3.95 MIL/uL — ABNORMAL LOW (ref 4.22–5.81)
RDW: 17 % — ABNORMAL HIGH (ref 11.5–15.5)
WBC: 6.4 10*3/uL (ref 4.0–10.5)
nRBC: 0 % (ref 0.0–0.2)

## 2021-11-16 LAB — GLUCOSE, CAPILLARY
Glucose-Capillary: 376 mg/dL — ABNORMAL HIGH (ref 70–99)
Glucose-Capillary: 419 mg/dL — ABNORMAL HIGH (ref 70–99)
Glucose-Capillary: 278 mg/dL — ABNORMAL HIGH (ref 70–99)
Glucose-Capillary: 133 mg/dL — ABNORMAL HIGH (ref 70–99)
Glucose-Capillary: 176 mg/dL — ABNORMAL HIGH (ref 70–99)
Glucose-Capillary: 137 mg/dL — ABNORMAL HIGH (ref 70–99)
Glucose-Capillary: 279 mg/dL — ABNORMAL HIGH (ref 70–99)
Glucose-Capillary: 296 mg/dL — ABNORMAL HIGH (ref 70–99)
Glucose-Capillary: 305 mg/dL — ABNORMAL HIGH (ref 70–99)
Glucose-Capillary: 310 mg/dL — ABNORMAL HIGH (ref 70–99)

## 2021-11-16 LAB — MRSA NEXT GEN BY PCR, NASAL: MRSA by PCR Next Gen: NOT DETECTED

## 2021-11-16 LAB — CBG MONITORING, ED: Glucose-Capillary: 466 mg/dL — ABNORMAL HIGH (ref 70–99)

## 2021-11-16 SURGERY — AMPUTATION BELOW KNEE
Anesthesia: Regional | Site: Knee | Laterality: Right

## 2021-11-16 MED ORDER — ACETAMINOPHEN 325 MG PO TABS: 325.0000 mg | ORAL_TABLET | Freq: Four times a day (QID) | ORAL | Status: DC | PRN

## 2021-11-16 MED ORDER — ASCORBIC ACID 500 MG PO TABS
1000.0000 mg | ORAL_TABLET | Freq: Every day | ORAL | Status: DC
  Administered 2021-11-16 – 2021-11-19 (×4): 1000 mg via ORAL
  Filled 2021-11-16 (×4): qty 2

## 2021-11-16 MED ORDER — SODIUM ZIRCONIUM CYCLOSILICATE 10 G PO PACK
10.0000 g | PACK | Freq: Every day | ORAL | Status: DC
  Administered 2021-11-17: 10 g via ORAL
  Filled 2021-11-16: qty 1

## 2021-11-16 MED ORDER — ATORVASTATIN CALCIUM 10 MG PO TABS
10.0000 mg | ORAL_TABLET | Freq: Every day | ORAL | Status: DC
  Administered 2021-11-17 – 2021-11-19 (×3): 10 mg via ORAL
  Filled 2021-11-16 (×3): qty 1

## 2021-11-16 MED ORDER — JUVEN PO PACK
1.0000 | PACK | Freq: Two times a day (BID) | ORAL | Status: DC
  Administered 2021-11-16 – 2021-11-19 (×7): 1 via ORAL
  Filled 2021-11-16 (×7): qty 1

## 2021-11-16 MED ORDER — HYDROMORPHONE HCL 1 MG/ML IJ SOLN
0.5000 mg | INTRAMUSCULAR | Status: DC | PRN
  Administered 2021-11-17 (×2): 1 mg via INTRAVENOUS
  Filled 2021-11-16 (×2): qty 1

## 2021-11-16 MED ORDER — INSULIN ASPART 100 UNIT/ML IJ SOLN
0.0000 [IU] | Freq: Every day | INTRAMUSCULAR | Status: DC
  Administered 2021-11-16: 4 [IU] via SUBCUTANEOUS

## 2021-11-16 MED ORDER — LACTATED RINGERS IV SOLN: INTRAVENOUS | Status: DC

## 2021-11-16 MED ORDER — CEFAZOLIN SODIUM-DEXTROSE 2-4 GM/100ML-% IV SOLN
2.0000 g | Freq: Three times a day (TID) | INTRAVENOUS | Status: AC
  Administered 2021-11-16 (×2): 2 g via INTRAVENOUS
  Filled 2021-11-16 (×2): qty 100

## 2021-11-16 MED ORDER — INSULIN ASPART 100 UNIT/ML IJ SOLN
2.0000 [IU] | Freq: Three times a day (TID) | INTRAMUSCULAR | Status: DC
  Administered 2021-11-17: 2 [IU] via SUBCUTANEOUS

## 2021-11-16 MED ORDER — FENTANYL CITRATE (PF) 100 MCG/2ML IJ SOLN: 50.0000 ug | Freq: Once | INTRAMUSCULAR | Status: AC

## 2021-11-16 MED ORDER — HYDRALAZINE HCL 25 MG PO TABS
25.0000 mg | ORAL_TABLET | Freq: Two times a day (BID) | ORAL | Status: DC
  Administered 2021-11-16 – 2021-11-19 (×5): 25 mg via ORAL
  Filled 2021-11-16 (×6): qty 1

## 2021-11-16 MED ORDER — ONDANSETRON HCL 4 MG/2ML IJ SOLN: 4.0000 mg | Freq: Four times a day (QID) | INTRAMUSCULAR | Status: DC | PRN

## 2021-11-16 MED ORDER — BRIMONIDINE TARTRATE 0.2 % OP SOLN
1.0000 [drp] | Freq: Three times a day (TID) | OPHTHALMIC | Status: DC
  Administered 2021-11-16 – 2021-11-19 (×9): 1 [drp] via OPHTHALMIC
  Filled 2021-11-16: qty 5

## 2021-11-16 MED ORDER — MAGNESIUM CITRATE PO SOLN: 1.0000 | Freq: Once | ORAL | Status: DC | PRN

## 2021-11-16 MED ORDER — FENTANYL CITRATE (PF) 100 MCG/2ML IJ SOLN: 25.0000 ug | INTRAMUSCULAR | Status: DC | PRN

## 2021-11-16 MED ORDER — PANTOPRAZOLE SODIUM 40 MG PO TBEC
40.0000 mg | DELAYED_RELEASE_TABLET | Freq: Every day | ORAL | Status: DC
  Administered 2021-11-16 – 2021-11-19 (×4): 40 mg via ORAL
  Filled 2021-11-16 (×4): qty 1

## 2021-11-16 MED ORDER — ROCURONIUM BROMIDE 10 MG/ML (PF) SYRINGE
PREFILLED_SYRINGE | INTRAVENOUS | Status: AC
  Filled 2021-11-16: qty 10

## 2021-11-16 MED ORDER — LIDOCAINE 2% (20 MG/ML) 5 ML SYRINGE
INTRAMUSCULAR | Status: AC
  Filled 2021-11-16: qty 5

## 2021-11-16 MED ORDER — ONDANSETRON HCL 4 MG/2ML IJ SOLN: 4.0000 mg | Freq: Once | INTRAMUSCULAR | Status: DC | PRN

## 2021-11-16 MED ORDER — FENTANYL CITRATE (PF) 250 MCG/5ML IJ SOLN
INTRAMUSCULAR | Status: AC
  Filled 2021-11-16: qty 5

## 2021-11-16 MED ORDER — INSULIN GLARGINE-YFGN 100 UNIT/ML ~~LOC~~ SOLN
15.0000 [IU] | Freq: Every day | SUBCUTANEOUS | Status: DC
  Administered 2021-11-16 – 2021-11-17 (×2): 15 [IU] via SUBCUTANEOUS
  Filled 2021-11-16 (×2): qty 0.15

## 2021-11-16 MED ORDER — ZINC SULFATE 220 (50 ZN) MG PO CAPS
220.0000 mg | ORAL_CAPSULE | Freq: Every day | ORAL | Status: DC
  Administered 2021-11-16 – 2021-11-19 (×4): 220 mg via ORAL
  Filled 2021-11-16 (×4): qty 1

## 2021-11-16 MED ORDER — MIDAZOLAM HCL 2 MG/2ML IJ SOLN
INTRAMUSCULAR | Status: AC
  Filled 2021-11-16: qty 2

## 2021-11-16 MED ORDER — BUPIVACAINE-EPINEPHRINE (PF) 0.5% -1:200000 IJ SOLN
INTRAMUSCULAR | Status: DC | PRN
  Administered 2021-11-16: 10 mL via PERINEURAL
  Administered 2021-11-16: 30 mL via PERINEURAL

## 2021-11-16 MED ORDER — TACROLIMUS 1 MG PO CAPS
4.0000 mg | ORAL_CAPSULE | Freq: Two times a day (BID) | ORAL | Status: DC
  Administered 2021-11-16 – 2021-11-19 (×6): 4 mg via ORAL
  Filled 2021-11-16 (×7): qty 4

## 2021-11-16 MED ORDER — POVIDONE-IODINE 10 % EX SWAB
2.0000 | Freq: Once | CUTANEOUS | Status: AC
  Administered 2021-11-16: 2 via TOPICAL

## 2021-11-16 MED ORDER — ONDANSETRON HCL 4 MG PO TABS: 4.0000 mg | ORAL_TABLET | Freq: Four times a day (QID) | ORAL | Status: DC | PRN

## 2021-11-16 MED ORDER — ADULT MULTIVITAMIN W/MINERALS CH
1.0000 | ORAL_TABLET | Freq: Every day | ORAL | Status: DC
  Administered 2021-11-17 – 2021-11-19 (×3): 1 via ORAL
  Filled 2021-11-16 (×3): qty 1

## 2021-11-16 MED ORDER — DOXAZOSIN MESYLATE 4 MG PO TABS
4.0000 mg | ORAL_TABLET | Freq: Every day | ORAL | Status: DC
  Administered 2021-11-17 – 2021-11-19 (×3): 4 mg via ORAL
  Filled 2021-11-16 (×4): qty 1

## 2021-11-16 MED ORDER — PROPOFOL 500 MG/50ML IV EMUL
INTRAVENOUS | Status: DC | PRN
  Administered 2021-11-16: 100 ug/kg/min via INTRAVENOUS

## 2021-11-16 MED ORDER — ALUM & MAG HYDROXIDE-SIMETH 200-200-20 MG/5ML PO SUSP: 15.0000 mL | ORAL | Status: DC | PRN

## 2021-11-16 MED ORDER — CLONIDINE HCL 0.3 MG/24HR TD PTWK
0.3000 mg | MEDICATED_PATCH | TRANSDERMAL | Status: DC
  Administered 2021-11-17: 0.3 mg via TRANSDERMAL
  Filled 2021-11-16: qty 1

## 2021-11-16 MED ORDER — FENTANYL CITRATE (PF) 100 MCG/2ML IJ SOLN
INTRAMUSCULAR | Status: DC | PRN
  Administered 2021-11-16: 50 ug via INTRAVENOUS

## 2021-11-16 MED ORDER — INSULIN REGULAR(HUMAN) IN NACL 100-0.9 UT/100ML-% IV SOLN
INTRAVENOUS | Status: DC
  Administered 2021-11-16: 13 [IU]/h via INTRAVENOUS
  Filled 2021-11-16: qty 100

## 2021-11-16 MED ORDER — DOCUSATE SODIUM 100 MG PO CAPS
100.0000 mg | ORAL_CAPSULE | Freq: Every day | ORAL | Status: DC
  Administered 2021-11-17: 100 mg via ORAL
  Filled 2021-11-16 (×2): qty 1

## 2021-11-16 MED ORDER — OXYCODONE HCL 5 MG PO TABS
10.0000 mg | ORAL_TABLET | ORAL | Status: DC | PRN
  Administered 2021-11-17 – 2021-11-18 (×2): 15 mg via ORAL
  Administered 2021-11-19: 10 mg via ORAL
  Filled 2021-11-16 (×2): qty 3
  Filled 2021-11-16: qty 2
  Filled 2021-11-16: qty 3

## 2021-11-16 MED ORDER — DEXTROSE 50 % IV SOLN: 0.0000 mL | INTRAVENOUS | Status: DC | PRN

## 2021-11-16 MED ORDER — CHLORHEXIDINE GLUCONATE 0.12 % MT SOLN
OROMUCOSAL | Status: AC
  Administered 2021-11-16: 15 mL via OROMUCOSAL
  Filled 2021-11-16: qty 15

## 2021-11-16 MED ORDER — GABAPENTIN 300 MG PO CAPS
300.0000 mg | ORAL_CAPSULE | Freq: Three times a day (TID) | ORAL | Status: DC
  Administered 2021-11-16 – 2021-11-19 (×9): 300 mg via ORAL
  Filled 2021-11-16 (×9): qty 1

## 2021-11-16 MED ORDER — MAGNESIUM SULFATE 2 GM/50ML IV SOLN: 2.0000 g | Freq: Every day | INTRAVENOUS | Status: DC | PRN

## 2021-11-16 MED ORDER — DEXTROSE IN LACTATED RINGERS 5 % IV SOLN
INTRAVENOUS | Status: DC
Start: 2021-11-16 — End: 2021-11-16

## 2021-11-16 MED ORDER — GUAIFENESIN-DM 100-10 MG/5ML PO SYRP: 15.0000 mL | ORAL_SOLUTION | ORAL | Status: DC | PRN

## 2021-11-16 MED ORDER — PROPOFOL 10 MG/ML IV BOLUS
INTRAVENOUS | Status: DC | PRN
  Administered 2021-11-16: 30 mg via INTRAVENOUS

## 2021-11-16 MED ORDER — CHLORHEXIDINE GLUCONATE 4 % EX LIQD
60.0000 mL | Freq: Once | CUTANEOUS | Status: DC
  Administered 2021-11-16: 4 via TOPICAL

## 2021-11-16 MED ORDER — SODIUM CHLORIDE 0.9 % IV SOLN: INTRAVENOUS | Status: DC

## 2021-11-16 MED ORDER — METOPROLOL TARTRATE 5 MG/5ML IV SOLN: 2.0000 mg | INTRAVENOUS | Status: DC | PRN

## 2021-11-16 MED ORDER — DORZOLAMIDE HCL-TIMOLOL MAL 2-0.5 % OP SOLN
1.0000 [drp] | Freq: Two times a day (BID) | OPHTHALMIC | Status: DC
Start: 2021-11-16 — End: 2021-11-19
  Administered 2021-11-16 – 2021-11-19 (×6): 1 [drp] via OPHTHALMIC
  Filled 2021-11-16: qty 10

## 2021-11-16 MED ORDER — PROPOFOL 10 MG/ML IV BOLUS
INTRAVENOUS | Status: AC
  Filled 2021-11-16: qty 20

## 2021-11-16 MED ORDER — INSULIN ASPART 100 UNIT/ML IJ SOLN
0.0000 [IU] | INTRAMUSCULAR | Status: DC | PRN
  Administered 2021-11-16: 8 [IU] via SUBCUTANEOUS

## 2021-11-16 MED ORDER — INSULIN ASPART 100 UNIT/ML IJ SOLN
0.0000 [IU] | Freq: Three times a day (TID) | INTRAMUSCULAR | Status: DC
  Administered 2021-11-16 – 2021-11-17 (×2): 11 [IU] via SUBCUTANEOUS
  Administered 2021-11-17 (×2): 15 [IU] via SUBCUTANEOUS
  Administered 2021-11-18 (×2): 5 [IU] via SUBCUTANEOUS
  Administered 2021-11-19 (×2): 2 [IU] via SUBCUTANEOUS

## 2021-11-16 MED ORDER — 0.9 % SODIUM CHLORIDE (POUR BTL) OPTIME
TOPICAL | Status: DC | PRN
  Administered 2021-11-16: 1000 mL

## 2021-11-16 MED ORDER — PIPERACILLIN-TAZOBACTAM 3.375 G IVPB
3.3750 g | Freq: Three times a day (TID) | INTRAVENOUS | Status: AC
  Administered 2021-11-16 – 2021-11-17 (×4): 3.375 g via INTRAVENOUS
  Filled 2021-11-16 (×4): qty 50

## 2021-11-16 MED ORDER — PHENOL 1.4 % MT LIQD: 1.0000 | OROMUCOSAL | Status: DC | PRN

## 2021-11-16 MED ORDER — SODIUM BICARBONATE 650 MG PO TABS
1300.0000 mg | ORAL_TABLET | Freq: Three times a day (TID) | ORAL | Status: DC
  Administered 2021-11-16 – 2021-11-19 (×9): 1300 mg via ORAL
  Filled 2021-11-16 (×9): qty 2

## 2021-11-16 MED ORDER — CEFAZOLIN SODIUM-DEXTROSE 2-4 GM/100ML-% IV SOLN
INTRAVENOUS | Status: AC
  Filled 2021-11-16: qty 100

## 2021-11-16 MED ORDER — BISACODYL 5 MG PO TBEC: 5.0000 mg | DELAYED_RELEASE_TABLET | Freq: Every day | ORAL | Status: DC | PRN

## 2021-11-16 MED ORDER — HYDRALAZINE HCL 20 MG/ML IJ SOLN: 5.0000 mg | INTRAMUSCULAR | Status: DC | PRN

## 2021-11-16 MED ORDER — CEFAZOLIN SODIUM-DEXTROSE 2-4 GM/100ML-% IV SOLN
2.0000 g | INTRAVENOUS | Status: AC
  Administered 2021-11-16: 2 g via INTRAVENOUS

## 2021-11-16 MED ORDER — LATANOPROST 0.005 % OP SOLN
1.0000 [drp] | Freq: Every day | OPHTHALMIC | Status: DC
  Administered 2021-11-16 – 2021-11-18 (×3): 1 [drp] via OPHTHALMIC
  Filled 2021-11-16: qty 2.5

## 2021-11-16 MED ORDER — POTASSIUM CHLORIDE CRYS ER 20 MEQ PO TBCR: 20.0000 meq | EXTENDED_RELEASE_TABLET | Freq: Every day | ORAL | Status: DC | PRN

## 2021-11-16 MED ORDER — CHLORHEXIDINE GLUCONATE 0.12 % MT SOLN: 15.0000 mL | Freq: Once | OROMUCOSAL | Status: AC

## 2021-11-16 MED ORDER — OXYCODONE-ACETAMINOPHEN 5-325 MG PO TABS
1.0000 | ORAL_TABLET | ORAL | Status: DC | PRN
  Administered 2021-11-16 – 2021-11-18 (×5): 2 via ORAL
  Filled 2021-11-16 (×5): qty 2

## 2021-11-16 MED ORDER — LABETALOL HCL 5 MG/ML IV SOLN: 10.0000 mg | INTRAVENOUS | Status: DC | PRN

## 2021-11-16 MED ORDER — OXYCODONE HCL 5 MG PO TABS
5.0000 mg | ORAL_TABLET | ORAL | Status: DC | PRN
  Administered 2021-11-16 – 2021-11-19 (×2): 10 mg via ORAL
  Filled 2021-11-16 (×2): qty 2

## 2021-11-16 MED ORDER — POLYETHYLENE GLYCOL 3350 17 G PO PACK: 17.0000 g | PACK | Freq: Every day | ORAL | Status: DC | PRN

## 2021-11-16 MED ORDER — FENTANYL CITRATE (PF) 100 MCG/2ML IJ SOLN
INTRAMUSCULAR | Status: AC
  Administered 2021-11-16: 50 ug via INTRAVENOUS
  Filled 2021-11-16: qty 2

## 2021-11-16 SURGICAL SUPPLY — 40 items
BAG COUNTER SPONGE SURGICOUNT (BAG) IMPLANT
BAG SPNG CNTER NS LX DISP (BAG)
BLADE SAW RECIP 87.9 MT (BLADE) ×2 IMPLANT
BLADE SURG 21 STRL SS (BLADE) ×2 IMPLANT
BNDG COHESIVE 6X5 TAN STRL LF (GAUZE/BANDAGES/DRESSINGS) ×1 IMPLANT
CANISTER WOUNDNEG PRESSURE 500 (CANNISTER) ×1 IMPLANT
COVER SURGICAL LIGHT HANDLE (MISCELLANEOUS) ×2 IMPLANT
CUFF TOURN SGL QUICK 34 (TOURNIQUET CUFF) ×2
CUFF TRNQT CYL 34X4.125X (TOURNIQUET CUFF) ×1 IMPLANT
DRAPE DERMATAC (DRAPES) ×3 IMPLANT
DRAPE INCISE IOBAN 66X45 STRL (DRAPES) ×1 IMPLANT
DRAPE U-SHAPE 47X51 STRL (DRAPES) ×2 IMPLANT
DRESSING PREVENA PLUS CUSTOM (GAUZE/BANDAGES/DRESSINGS) IMPLANT
DRSG PREVENA PLUS CUSTOM (GAUZE/BANDAGES/DRESSINGS) ×2
DURAPREP 26ML APPLICATOR (WOUND CARE) ×2 IMPLANT
ELECT REM PT RETURN 9FT ADLT (ELECTROSURGICAL) ×2
ELECTRODE REM PT RTRN 9FT ADLT (ELECTROSURGICAL) ×1 IMPLANT
GLOVE BIOGEL PI IND STRL 9 (GLOVE) ×1 IMPLANT
GLOVE BIOGEL PI INDICATOR 9 (GLOVE) ×1
GLOVE SURG ORTHO 9.0 STRL STRW (GLOVE) ×2 IMPLANT
GOWN STRL REUS W/ TWL XL LVL3 (GOWN DISPOSABLE) ×2 IMPLANT
GOWN STRL REUS W/TWL XL LVL3 (GOWN DISPOSABLE) ×4
GRAFT SKIN WND MICRO 38 (Tissue) ×1 IMPLANT
GRAFT SKIN WND OMEGA3 SB 7X10 (Tissue) ×1 IMPLANT
KIT BASIN OR (CUSTOM PROCEDURE TRAY) ×2 IMPLANT
KIT TURNOVER KIT B (KITS) ×2 IMPLANT
MANIFOLD NEPTUNE II (INSTRUMENTS) ×2 IMPLANT
NS IRRIG 1000ML POUR BTL (IV SOLUTION) ×2 IMPLANT
PACK ORTHO EXTREMITY (CUSTOM PROCEDURE TRAY) ×2 IMPLANT
PAD ARMBOARD 7.5X6 YLW CONV (MISCELLANEOUS) ×2 IMPLANT
PREVENA RESTOR ARTHOFORM 46X30 (CANNISTER) ×2 IMPLANT
STAPLER VISISTAT 35W (STAPLE) ×1 IMPLANT
STOCKINETTE IMPERVIOUS LG (DRAPES) ×2 IMPLANT
SUT ETHILON 2 0 PSLX (SUTURE) IMPLANT
SUT SILK 2 0 (SUTURE) ×2
SUT SILK 2-0 18XBRD TIE 12 (SUTURE) ×1 IMPLANT
SUT VIC AB 1 CTX 27 (SUTURE) ×4 IMPLANT
TOWEL GREEN STERILE (TOWEL DISPOSABLE) ×2 IMPLANT
TUBE CONNECTING 12X1/4 (SUCTIONS) ×2 IMPLANT
YANKAUER SUCT BULB TIP NO VENT (SUCTIONS) ×2 IMPLANT

## 2021-11-16 NOTE — Inpatient Diabetes Management (Signed)
Inpatient Diabetes Program Recommendations  AACE/ADA: New Consensus Statement on Inpatient Glycemic Control  Target Ranges:  Prepandial:   less than 140 mg/dL      Peak postprandial:   less than 180 mg/dL (1-2 hours)      Critically ill patients:  140 - 180 mg/dL    Latest Reference Range & Units 11/16/21 04:24 11/16/21 05:35 11/16/21 06:28 11/16/21 08:14 11/16/21 10:52  Glucose-Capillary 70 - 99 mg/dL 433 (H) 295 (H) 188 (H) 296 (H) 305 (H)    Latest Reference Range & Units 11/16/21 01:09 11/16/21 02:32 11/16/21 03:12 11/16/21 04:24 11/16/21 05:35 11/16/21 06:28  Glucose-Capillary 70 - 99 mg/dL 416 (H) 606 (H) 301 (H) 278 (H) 176 (H) 133 (H)   Review of Glycemic Control  Diabetes history: DM2 Outpatient Diabetes medications: Trulicity 0.75 mg Qweek (Sunday), Regular 1-9 units TID, NPH 60 units BID Current orders for Inpatient glycemic control: Novolog 0-15 units TID with meals, Novolog 0-5 units QHS, IV insulin  Inpatient Diabetes Program Recommendations:    Insulin: Per chart, patient was ordered IV insulin which was stopped around 6:32 am today. No basal insulin given at transition off IV to SQ insulin. Please consider ordering Semglee 20 units Q24H (to start now).  NOTE: Noted consult for Diabetes Coordinator. Diabetes Coordinator is not on campus over the weekend but available by pager from 8am to 5pm for questions or concerns. Chart reviewed. Inpatient diabetes coordinator last spoke with patient on 08/15/21 during prior admission.  Patient admitted on 11/15/21 with foot infection and had BKA today. Initial glucose 429 mg/dl and patient was ordered IV insulin. Per chart, appears IV insulin was stopped around 6:32 am today and no basal insulin ordered at time of transition. Glucose 305 mg/dl at 60:10 today. Sent message to Dr. Blake Divine to request Semglee 20 units Q24H to start now.  Thanks, Orlando Penner, RN, MSN, CDCES Diabetes Coordinator Inpatient Diabetes Program [phone] (Team  Pager from 8am to 5pm)

## 2021-11-17 ENCOUNTER — Encounter (HOSPITAL_COMMUNITY): Payer: Self-pay | Admitting: Orthopedic Surgery

## 2021-11-17 DIAGNOSIS — I10 Essential (primary) hypertension: Secondary | ICD-10-CM | POA: Diagnosis not present

## 2021-11-17 DIAGNOSIS — S88111A Complete traumatic amputation at level between knee and ankle, right lower leg, initial encounter: Secondary | ICD-10-CM

## 2021-11-17 DIAGNOSIS — Z940 Kidney transplant status: Secondary | ICD-10-CM | POA: Diagnosis not present

## 2021-11-17 DIAGNOSIS — E11628 Type 2 diabetes mellitus with other skin complications: Secondary | ICD-10-CM | POA: Diagnosis not present

## 2021-11-17 LAB — BASIC METABOLIC PANEL
Anion gap: 10 (ref 5–15)
BUN: 26 mg/dL — ABNORMAL HIGH (ref 8–23)
CO2: 17 mmol/L — ABNORMAL LOW (ref 22–32)
Calcium: 7 mg/dL — ABNORMAL LOW (ref 8.9–10.3)
Chloride: 108 mmol/L (ref 98–111)
Creatinine, Ser: 1.05 mg/dL (ref 0.61–1.24)
GFR, Estimated: >60 mL/min (ref >60)
Glucose, Bld: 305 mg/dL — ABNORMAL HIGH (ref 70–99)
Potassium: 4.1 mmol/L (ref 3.5–5.1)
Sodium: 135 mmol/L (ref 135–145)

## 2021-11-17 LAB — GLUCOSE, CAPILLARY
Glucose-Capillary: 390 mg/dL — ABNORMAL HIGH (ref 70–99)
Glucose-Capillary: 366 mg/dL — ABNORMAL HIGH (ref 70–99)
Glucose-Capillary: 294 mg/dL — ABNORMAL HIGH (ref 70–99)
Glucose-Capillary: 318 mg/dL — ABNORMAL HIGH (ref 70–99)

## 2021-11-17 LAB — HEMOGLOBIN A1C
Hgb A1c MFr Bld: 11.7 % — ABNORMAL HIGH (ref 4.8–5.6)
Mean Plasma Glucose: 289.09 mg/dL

## 2021-11-17 MED ORDER — INSULIN GLARGINE-YFGN 100 UNIT/ML ~~LOC~~ SOLN
20.0000 [IU] | Freq: Every day | SUBCUTANEOUS | Status: DC
  Administered 2021-11-17: 20 [IU] via SUBCUTANEOUS
  Filled 2021-11-17: qty 0.2

## 2021-11-17 MED ORDER — INSULIN ASPART 100 UNIT/ML IJ SOLN
4.0000 [IU] | Freq: Three times a day (TID) | INTRAMUSCULAR | Status: DC
  Administered 2021-11-17 (×2): 4 [IU] via SUBCUTANEOUS

## 2021-11-17 MED ORDER — INSULIN GLARGINE-YFGN 100 UNIT/ML ~~LOC~~ SOLN
20.0000 [IU] | Freq: Two times a day (BID) | SUBCUTANEOUS | Status: DC
  Administered 2021-11-17: 20 [IU] via SUBCUTANEOUS
  Filled 2021-11-17 (×3): qty 0.2

## 2021-11-17 NOTE — Progress Notes (Signed)
[facility]                                                                               Raymond [name], is a 61 y.o. male, DOB - 1960/12/06, WUJ:[id number] Admit date - 11/15/2021    Outpatient Primary MD for the patient is [name], FNP  LOS - 1  days    Brief summary   Raymond [name] is a 61 y.o. male with medical history significant of DM2, prior ESRD s/p renal transplant on immunosuppressive meds, HTN had diabetic foot infection of R foot this spring.  Ultimately required transmetatarsal amputation last month by Dr. Lajoyce Corners.Unfortunately amputation wound failed to heel / became infected as well despite PO doxycycline.  He was sent from office for BKA, which was done this am by Dr Lajoyce Corners.      Assessment & Plan    Assessment and Plan: * Diabetic foot infection (HCC) S/p Right BKA.  Pain control,. Therapy evaluations ordered and pending.  Orthopedics on board.    History of renal transplant Renal transplant in 2016. Creatinine around 1 today.  Continue with Prograf 4 mg BID.    Type 2 diabetes mellitus (HCC) Insulin dependent .  CBG (last 3)  Recent Labs    11/17/21 1218 11/17/21 1555 11/17/21 1602  GLUCAP 366* 294* 318*   Increased Semglee to 20 units BID, SSI and novolog TIDAC.  Hemoglobin A1c is 11.7  Mixed hyperlipidemia Continue Statin  Essential hypertension, benign Continue with bp meds.    Hyponatremia:  Resolved.   Microcytic anemia;  Transfuse to keep hemoglobin greater than 7.  Check for blood loss anemia.  H&H today and in am.   Metabolic acidosis:  - on sodium bicarbonate.       Estimated body mass index is 33.52 kg/m as calculated from the following:   Height as of 11/14/21: 5\' 7"  (1.702 m).   Weight as of 11/14/21: 97.1 kg.  Code Status: Full code.  DVT Prophylaxis:  SCD's Start: 11/16/21 1327 SCDs Start: 11/16/21 0009   Level of Care: Level of care: Telemetry Medical Family Communication: None at  bedside.   Disposition Plan:     Remains inpatient appropriate:  therapy eval pending.  Procedures:  Right BKA WITH Wound Vac Application.   Consultants:   Orthopedics.   Antimicrobials:   Anti-infectives (From admission, onward)    Start     Dose/Rate Route Frequency Ordered Stop   11/16/21 2300  vancomycin (VANCOCIN) IVPB 1000 mg/200 mL premix  Status:  Discontinued        1,000 mg 200 mL/hr over 60 Minutes Intravenous Every 24 hours 11/15/21 2250 11/15/21 2341   11/16/21 1415  ceFAZolin (ANCEF) IVPB 2g/100 mL premix        2 g 200 mL/hr over 30 Minutes Intravenous Every 8 hours 11/16/21 1326 11/16/21 2241   11/16/21 0859  ceFAZolin (ANCEF) 2-4 GM/100ML-% IVPB       Note to Pharmacy: Kathrene Bongo D: cabinet override      11/16/21 0859 11/16/21 2114   11/16/21 0830  ceFAZolin (ANCEF) IVPB 2g/100 mL premix        2 g 200 mL/hr over  30 Minutes Intravenous On call to O.R. 11/16/21 0828 11/16/21 1038   11/16/21 0600  piperacillin-tazobactam (ZOSYN) IVPB 3.375 g        3.375 g 12.5 mL/hr over 240 Minutes Intravenous Every 8 hours 11/16/21 0019 11/17/21 0927   11/15/21 2345  linezolid (ZYVOX) IVPB 600 mg        600 mg 300 mL/hr over 60 Minutes Intravenous Every 12 hours 11/15/21 2340 11/17/21 1100   11/15/21 2230  vancomycin (VANCOREADY) IVPB 2000 mg/400 mL        2,000 mg 200 mL/hr over 120 Minutes Intravenous  Once 11/15/21 2219 11/15/21 2340   11/15/21 2215  metroNIDAZOLE (FLAGYL) IVPB 500 mg  Status:  Discontinued        500 mg 100 mL/hr over 60 Minutes Intravenous Every 12 hours 11/15/21 2206 11/15/21 2341   11/15/21 2145  ceFEPIme (MAXIPIME) 2 g in sodium chloride 0.9 % 100 mL IVPB        2 g 200 mL/hr over 30 Minutes Intravenous  Once 11/15/21 2134 11/15/21 2341        Medications  Scheduled Meds:  vitamin C  1,000 mg Oral Daily   atorvastatin  10 mg Oral Daily   brimonidine  1 drop Both Eyes TID   cloNIDine  0.3 mg Transdermal Q Sun   docusate sodium  100  mg Oral Daily   dorzolamide-timolol  1 drop Both Eyes BID   doxazosin  4 mg Oral Daily   gabapentin  300 mg Oral TID   hydrALAZINE  25 mg Oral BID   insulin aspart  0-15 Units Subcutaneous TID WC   insulin aspart  0-5 Units Subcutaneous QHS   insulin aspart  4 Units Subcutaneous TID WC   insulin glargine-yfgn  20 Units Subcutaneous BID   latanoprost  1 drop Both Eyes QHS   multivitamin with minerals  1 tablet Oral Daily   nutrition supplement (JUVEN)  1 packet Oral BID BM   pantoprazole  40 mg Oral Daily   sodium bicarbonate  1,300 mg Oral TID   sodium zirconium cyclosilicate  10 g Oral Daily   tacrolimus  4 mg Oral BID   zinc sulfate  220 mg Oral Daily   Continuous Infusions:  sodium chloride 75 mL/hr at 11/16/21 1550   magnesium sulfate bolus IVPB     PRN Meds:.acetaminophen, alum & mag hydroxide-simeth, bisacodyl, dextrose, guaiFENesin-dextromethorphan, hydrALAZINE, HYDROmorphone (DILAUDID) injection, labetalol, magnesium sulfate bolus IVPB, metoprolol tartrate, ondansetron, ondansetron **OR** [DISCONTINUED] ondansetron (ZOFRAN) IV, oxyCODONE, oxyCODONE, oxyCODONE-acetaminophen, phenol, polyethylene glycol, potassium chloride    Subjective:   Raymond [name] was seen and examined today.  Pt sleepy  Objective:   Vitals:   11/17/21 0752 11/17/21 0830 11/17/21 1222 11/17/21 1611  BP: (!) 167/88  123/66 (!) 113/59  Pulse: 73  63 63  Resp: 18  18 19   Temp: 98 F (36.7 C)  98 F (36.7 C) 98.2 F (36.8 C)  TempSrc:    Oral  SpO2: 97% 95% 100% 97%    Intake/Output Summary (Last 24 hours) at 11/17/2021 1642 Last data filed at 11/17/2021 0400 Gross per 24 hour  Intake 1389.91 ml  Output 400 ml  Net 989.91 ml   There were no vitals filed for this visit.   Exam General exam: Appears calm and comfortable  Respiratory system: Clear to auscultation. Respiratory effort normal. Cardiovascular system: S1 & S2 heard, RRR. No JVD,  No pedal edema. Gastrointestinal system:  Abdomen is nondistended, soft and nontender.  Normal bowel sounds heard. Central nervous system: Alert and oriented. No focal neurological deficits. Extremities: right BKA.  Skin: No rashes, Psychiatry: Mood & affect appropriate.    Data Reviewed:  I have personally reviewed following labs and imaging studies   CBC Lab Results  Component Value Date   WBC 6.4 11/16/2021   RBC 3.95 (L) 11/16/2021   HGB 9.0 (L) 11/16/2021   HCT 29.8 (L) 11/16/2021   MCV 75.4 (L) 11/16/2021   MCH 22.8 (L) 11/16/2021   PLT 212 11/16/2021   MCHC 30.2 11/16/2021   RDW 17.0 (H) 11/16/2021   LYMPHSABS 1.9 11/15/2021   MONOABS 0.8 11/15/2021   EOSABS 0.1 11/15/2021   BASOSABS 0.0 11/15/2021     Last metabolic panel Lab Results  Component Value Date   NA 135 11/17/2021   K 4.1 11/17/2021   CL 108 11/17/2021   CO2 17 (L) 11/17/2021   BUN 26 (H) 11/17/2021   CREATININE 1.05 11/17/2021   GLUCOSE 305 (H) 11/17/2021   GFRNONAA >60 11/17/2021   GFRAA >60 01/20/2020   CALCIUM 7.0 (L) 11/17/2021   PHOS 3.1 08/30/2021   PROT 7.8 08/29/2021   ALBUMIN 2.3 (L) 08/30/2021   LABGLOB 3.6 02/28/2021   AGRATIO 1.1 (L) 02/28/2021   BILITOT 0.3 08/29/2021   ALKPHOS 90 08/29/2021   AST 13 (L) 08/29/2021   ALT 14 08/29/2021   ANIONGAP 10 11/17/2021    CBG (last 3)  Recent Labs    11/17/21 1218 11/17/21 1555 11/17/21 1602  GLUCAP 366* 294* 318*      Coagulation Profile: No results for input(s): "INR", "PROTIME" in the last 168 hours.   Radiology Studies: No results found.     Kathlen Mody M.D. [facility] 11/17/2021, 4:42 PM  Available via Epic secure chat 7am-7pm After 7 pm, please refer to night coverage provider listed on amion.

## 2021-11-17 NOTE — Progress Notes (Signed)
Patient ID: Raymond [name], male   DOB: April 16, 1961, 61 y.o.   MRN: [id number]   Subjective: 1 Day Post-Op Procedure(s) (LRB): RIGHT AMPUTATION BELOW KNEE WITH WOUND VAC APPLICATION (Right) Patient reports pain as mild.  Sleeping when I entered room, difficult to wake up. Denies significant pain. Fell asleep while I entered note.  Objective: Vital signs in last 24 hours: Temp:  [97.2 F (36.2 C)-99.1 F (37.3 C)] 98 F (36.7 C) (06/25 0752) Pulse Rate:  [65-84] 73 (06/25 0752) Resp:  [15-20] 18 (06/25 0752) BP: (121-188)/(67-88) 167/88 (06/25 0752) SpO2:  [93 %-100 %] 95 % (06/25 0830)  Intake/Output from previous day: 06/24 0701 - 06/25 0700 In: 1689.9 [I.V.:977.6; IV Piggyback:712.3] Out: 400 [Urine:400] Intake/Output this shift: No intake/output data recorded.  Recent Labs    11/15/21 2158 11/16/21 0248  HGB 9.4* 9.0*   Recent Labs    11/15/21 2158 11/16/21 0248  WBC 7.5 6.4  RBC 4.13* 3.95*  HCT 31.2* 29.8*  PLT 245 212   Recent Labs    11/16/21 0248 11/17/21 0347  NA 132* 135  K 4.3 4.1  CL 104 108  CO2 17* 17*  BUN 39* 26*  CREATININE 1.67* 1.05  GLUCOSE 432* 305*  CALCIUM 9.3 7.0*   No results for input(s): "LABPT", "INR" in the last 72 hours.   No results found.  VAC intact.   Assessment/Plan: 1 Day Post-Op Procedure(s) (LRB): RIGHT AMPUTATION BELOW KNEE WITH WOUND VAC APPLICATION (Right) Up with therapy .   Eldred Manges 11/17/2021, 10:28 AM

## 2021-11-17 NOTE — Evaluation (Signed)
Occupational Therapy Evaluation Patient Details Name: Raymond [name] MRN: [id number] DOB: 01-10-1961 Today's Date: 11/17/2021   History of Present Illness Pt is a 61 yo male s/p RIGHT AMPUTATION BELOW KNEE WITH WOUND VAC APPLICATION due to non healing foot abcess. PHMx:DM2, prior ESRD s/p renal transplant on immunosuppressive meds, HTN   Clinical Impression   This 61 yo male admitted with above presents to acute OT with PLOF of being totally independent with basic ADLs and IADLs. Currently he needs increased A for all mobility and ADLs, has decreased sitting and standing balance asa well as decreased awareness of his deficits and safety. He will continue to benefit from acute OT with follow up on AIR.      Recommendations for follow up therapy are one component of a multi-disciplinary discharge planning process, led by the attending physician.  Recommendations may be updated based on patient status, additional functional criteria and insurance authorization.   Follow Up Recommendations  Acute inpatient rehab (3hours/day)    Assistance Recommended at Discharge Frequent or constant Supervision/Assistance  Patient can return home with the following A lot of help with walking and/or transfers;A lot of help with bathing/dressing/bathroom;Assistance with cooking/housework;Assist for transportation;Direct supervision/assist for financial management;Direct supervision/assist for medications management    Functional Status Assessment  Patient has had a recent decline in their functional status and demonstrates the ability to make significant improvements in function in a reasonable and predictable amount of time.  Equipment Recommendations  Other (comment) (TBD next venue)       Precautions / Restrictions Precautions Precautions: Fall Precaution Comments: limb protector Restrictions Weight Bearing Restrictions: Yes RLE Weight Bearing: Non weight bearing      Mobility Bed  Mobility Overal bed mobility: Needs Assistance Bed Mobility: Supine to Sit     Supine to sit: Min guard, HOB elevated          Transfers Overall transfer level: Needs assistance Equipment used: Rolling walker (2 wheels) Transfers: Sit to/from Stand, Bed to chair/wheelchair/BSC Sit to Stand: Mod assist, +2 safety/equipment Stand pivot transfers: Mod assist, +2 safety/equipment         General transfer comment: stand hop 5 feet      Balance Overall balance assessment: Needs assistance Sitting-balance support: Bilateral upper extremity supported (LLE on floor) Sitting balance-Leahy Scale: Fair     Standing balance support: Bilateral upper extremity supported, Reliant on assistive device for balance Standing balance-Leahy Scale: Poor                             ADL either performed or assessed with clinical judgement   ADL Overall ADL's : Needs assistance/impaired Eating/Feeding: Independent;Sitting   Grooming: Sitting;Minimal assistance   Upper Body Bathing: Sitting;Minimal assistance   Lower Body Bathing: Total assistance Lower Body Bathing Details (indicate cue type and reason): Mod A +2 sit<>stand from bed with cues for safe hand placement Upper Body Dressing : Minimal assistance;Sitting   Lower Body Dressing: Total assistance Lower Body Dressing Details (indicate cue type and reason): Mod A +2 sit<>stand from bed with cues for safe hand placement Toilet Transfer: Moderate assistance;+2 for safety/equipment Toilet Transfer Details (indicate cue type and reason): hop 5 feet with RW Toileting- Clothing Manipulation and Hygiene: Total assistance Toileting - Clothing Manipulation Details (indicate cue type and reason): Mod A +2 sit<>stand from bed with cues for safe hand placement             Vision Patient Visual Report:  No change from baseline              Pertinent Vitals/Pain Pain Assessment Pain Assessment: No/denies pain     Hand  Dominance Right   Extremity/Trunk Assessment Upper Extremity Assessment Upper Extremity Assessment: Defer to OT evaluation   Lower Extremity Assessment Lower Extremity Assessment: RLE deficits/detail RLE Deficits / Details: Postop R BKA; hip and knee ROM WFL for simple moblity tasks; Overall decr coordination, with one instance of pt continually scooting forward to the point of nearly scooting off of the bed in an effort to get his (now amputated) R foot to the floor; did not report phantom pain or phantom sensation       Communication Communication Communication: No difficulties   Cognition Arousal/Alertness: Lethargic, Suspect due to medications Behavior During Therapy: Flat affect Overall Cognitive Status: No family/caregiver present to determine baseline cognitive functioning Area of Impairment: Safety/judgement                         Safety/Judgement: Decreased awareness of safety, Decreased awareness of deficits           General Comments  Attempted education re: positioning and stretching in prep for prosthesis, as well as desensitization, however, pt quite sleepy            Home Living Family/patient expects to be discharged to:: Inpatient rehab Living Arrangements: Spouse/significant other Available Help at Discharge: Family;Available 24 hours/day Type of Home: House Home Access: Stairs to enter [employer] of Steps: 4 Entrance Stairs-Rails: Right;Left Home Layout: One level     Bathroom Shower/Tub: Tub/shower unit;Walk-in shower (has been sponge bathing)   Bathroom Toilet: Standard                Prior Functioning/Environment Prior Level of Function : Independent/Modified Independent             Mobility Comments: Reports walking without AD at home. ADLs Comments: Independent with ADLs and IADLs        OT Problem List: Decreased strength;Decreased activity tolerance;Impaired balance (sitting and/or standing);Decreased  cognition;Decreased safety awareness      OT Treatment/Interventions: Self-care/ADL training;DME and/or AE instruction;Patient/family education;Balance training    OT Goals(Current goals can be found in the care plan section) Acute Rehab OT Goals OT Goal Formulation: With patient Time For Goal Achievement: 12/01/21 Potential to Achieve Goals: Good  OT Frequency: Min 2X/week    Co-evaluation PT/OT/SLP Co-Evaluation/Treatment: Yes Reason for Co-Treatment: Necessary to address cognition/behavior during functional activity PT goals addressed during session: Mobility/safety with mobility OT goals addressed during session: Strengthening/ROM;ADL's and self-care      AM-PAC OT "6 Clicks" Daily Activity     Outcome Measure Help from another person eating meals?: A Lot Help from another person taking care of personal grooming?: A Lot Help from another person toileting, which includes using toliet, bedpan, or urinal?: A Lot Help from another person bathing (including washing, rinsing, drying)?: A Lot Help from another person to put on and taking off regular upper body clothing?: A Lot Help from another person to put on and taking off regular lower body clothing?: Total 6 Click Score: 11   End of Session Equipment Utilized During Treatment: Gait belt;Rolling walker (2 wheels) Nurse Communication: Mobility status  Activity Tolerance: Patient limited by lethargy Patient left: in chair;with call bell/phone within reach;with chair alarm set  OT Visit Diagnosis: Unsteadiness on feet (R26.81);Other abnormalities of gait and mobility (R26.89);Muscle weakness (generalized) (M62.81);Other symptoms and signs  involving cognitive function                Time: 1152-1220 OT Time Calculation (min): 28 min Charges:  OT General Charges $OT Visit: 1 Visit OT Evaluation $OT Eval Moderate Complexity: 1 Mod  Ignacia Palma, OTR/L Acute [employer] [facility] [phone] Office  [phone]    Evette Georges 11/17/2021, 4:32 PM

## 2021-11-18 DIAGNOSIS — L089 Local infection of the skin and subcutaneous tissue, unspecified: Secondary | ICD-10-CM | POA: Diagnosis not present

## 2021-11-18 DIAGNOSIS — E11628 Type 2 diabetes mellitus with other skin complications: Secondary | ICD-10-CM | POA: Diagnosis not present

## 2021-11-18 LAB — BASIC METABOLIC PANEL
Anion gap: 7 (ref 5–15)
BUN: 40 mg/dL — ABNORMAL HIGH (ref 8–23)
CO2: 21 mmol/L — ABNORMAL LOW (ref 22–32)
Calcium: 8.7 mg/dL — ABNORMAL LOW (ref 8.9–10.3)
Chloride: 105 mmol/L (ref 98–111)
Creatinine, Ser: 1.84 mg/dL — ABNORMAL HIGH (ref 0.61–1.24)
GFR, Estimated: 41 mL/min — ABNORMAL LOW (ref >60)
Glucose, Bld: 216 mg/dL — ABNORMAL HIGH (ref 70–99)
Potassium: 4.6 mmol/L (ref 3.5–5.1)
Sodium: 133 mmol/L — ABNORMAL LOW (ref 135–145)

## 2021-11-18 LAB — CBC WITH DIFFERENTIAL/PLATELET
Abs Immature Granulocytes: 0.07 10*3/uL (ref 0.00–0.07)
Basophils Absolute: 0 10*3/uL (ref 0.0–0.1)
Basophils Relative: 0 %
Eosinophils Absolute: 0.2 10*3/uL (ref 0.0–0.5)
Eosinophils Relative: 2 %
HCT: 31.6 % — ABNORMAL LOW (ref 39.0–52.0)
Hemoglobin: 9.2 g/dL — ABNORMAL LOW (ref 13.0–17.0)
Immature Granulocytes: 1 %
Lymphocytes Relative: 22 %
Lymphs Abs: 1.7 10*3/uL (ref 0.7–4.0)
MCH: 22 pg — ABNORMAL LOW (ref 26.0–34.0)
MCHC: 29.1 g/dL — ABNORMAL LOW (ref 30.0–36.0)
MCV: 75.4 fL — ABNORMAL LOW (ref 80.0–100.0)
Monocytes Absolute: 0.6 10*3/uL (ref 0.1–1.0)
Monocytes Relative: 8 %
Neutro Abs: 5 10*3/uL (ref 1.7–7.7)
Neutrophils Relative %: 67 %
Platelets: 132 10*3/uL — ABNORMAL LOW (ref 150–400)
RBC: 4.19 MIL/uL — ABNORMAL LOW (ref 4.22–5.81)
RDW: 17.5 % — ABNORMAL HIGH (ref 11.5–15.5)
WBC: 7.6 10*3/uL (ref 4.0–10.5)
nRBC: 0 % (ref 0.0–0.2)

## 2021-11-18 LAB — GLUCOSE, CAPILLARY
Glucose-Capillary: 202 mg/dL — ABNORMAL HIGH (ref 70–99)
Glucose-Capillary: 227 mg/dL — ABNORMAL HIGH (ref 70–99)
Glucose-Capillary: 234 mg/dL — ABNORMAL HIGH (ref 70–99)
Glucose-Capillary: 118 mg/dL — ABNORMAL HIGH (ref 70–99)
Glucose-Capillary: 135 mg/dL — ABNORMAL HIGH (ref 70–99)

## 2021-11-18 MED ORDER — INSULIN GLARGINE-YFGN 100 UNIT/ML ~~LOC~~ SOLN
22.0000 [IU] | Freq: Two times a day (BID) | SUBCUTANEOUS | Status: DC
  Administered 2021-11-18 – 2021-11-19 (×3): 22 [IU] via SUBCUTANEOUS
  Filled 2021-11-18 (×4): qty 0.22

## 2021-11-18 MED ORDER — INSULIN ASPART 100 UNIT/ML IJ SOLN
6.0000 [IU] | Freq: Three times a day (TID) | INTRAMUSCULAR | Status: DC
  Administered 2021-11-18 – 2021-11-19 (×4): 6 [IU] via SUBCUTANEOUS

## 2021-11-18 MED ORDER — LACTATED RINGERS IV SOLN: INTRAVENOUS | Status: AC

## 2021-11-19 DIAGNOSIS — I10 Essential (primary) hypertension: Secondary | ICD-10-CM | POA: Diagnosis not present

## 2021-11-19 DIAGNOSIS — L089 Local infection of the skin and subcutaneous tissue, unspecified: Secondary | ICD-10-CM | POA: Diagnosis not present

## 2021-11-19 DIAGNOSIS — E11628 Type 2 diabetes mellitus with other skin complications: Secondary | ICD-10-CM | POA: Diagnosis not present

## 2021-11-19 DIAGNOSIS — N185 Chronic kidney disease, stage 5: Secondary | ICD-10-CM | POA: Diagnosis not present

## 2021-11-19 DIAGNOSIS — E875 Hyperkalemia: Secondary | ICD-10-CM | POA: Diagnosis not present

## 2021-11-19 DIAGNOSIS — Z940 Kidney transplant status: Secondary | ICD-10-CM | POA: Diagnosis not present

## 2021-11-19 DIAGNOSIS — E1122 Type 2 diabetes mellitus with diabetic chronic kidney disease: Secondary | ICD-10-CM | POA: Diagnosis not present

## 2021-11-19 DIAGNOSIS — N189 Chronic kidney disease, unspecified: Secondary | ICD-10-CM | POA: Diagnosis not present

## 2021-11-19 DIAGNOSIS — Z794 Long term (current) use of insulin: Secondary | ICD-10-CM | POA: Diagnosis not present

## 2021-11-19 DIAGNOSIS — E669 Obesity, unspecified: Secondary | ICD-10-CM | POA: Diagnosis not present

## 2021-11-19 DIAGNOSIS — L02611 Cutaneous abscess of right foot: Secondary | ICD-10-CM | POA: Diagnosis not present

## 2021-11-19 DIAGNOSIS — E782 Mixed hyperlipidemia: Secondary | ICD-10-CM | POA: Diagnosis not present

## 2021-11-19 DIAGNOSIS — E11621 Type 2 diabetes mellitus with foot ulcer: Secondary | ICD-10-CM | POA: Diagnosis not present

## 2021-11-19 LAB — BASIC METABOLIC PANEL
Anion gap: 11 (ref 5–15)
BUN: 48 mg/dL — ABNORMAL HIGH (ref 8–23)
CO2: 23 mmol/L (ref 22–32)
Calcium: 9.2 mg/dL (ref 8.9–10.3)
Chloride: 106 mmol/L (ref 98–111)
Creatinine, Ser: 2.24 mg/dL — ABNORMAL HIGH (ref 0.61–1.24)
GFR, Estimated: 33 mL/min — ABNORMAL LOW (ref >60)
Glucose, Bld: 137 mg/dL — ABNORMAL HIGH (ref 70–99)
Potassium: 4.7 mmol/L (ref 3.5–5.1)
Sodium: 140 mmol/L (ref 135–145)

## 2021-11-19 LAB — CBC
HCT: 25.8 % — ABNORMAL LOW (ref 39.0–52.0)
Hemoglobin: 7.5 g/dL — ABNORMAL LOW (ref 13.0–17.0)
MCH: 22.2 pg — ABNORMAL LOW (ref 26.0–34.0)
MCHC: 29.1 g/dL — ABNORMAL LOW (ref 30.0–36.0)
MCV: 76.3 fL — ABNORMAL LOW (ref 80.0–100.0)
Platelets: 283 10*3/uL (ref 150–400)
RBC: 3.38 MIL/uL — ABNORMAL LOW (ref 4.22–5.81)
RDW: 17.9 % — ABNORMAL HIGH (ref 11.5–15.5)
WBC: 8 10*3/uL (ref 4.0–10.5)
nRBC: 0 % (ref 0.0–0.2)

## 2021-11-19 LAB — GLUCOSE, CAPILLARY
Glucose-Capillary: 126 mg/dL — ABNORMAL HIGH (ref 70–99)
Glucose-Capillary: 125 mg/dL — ABNORMAL HIGH (ref 70–99)

## 2021-11-19 LAB — SURGICAL PATHOLOGY

## 2021-11-19 MED ORDER — ZINC SULFATE 220 (50 ZN) MG PO CAPS: 220.0000 mg | ORAL_CAPSULE | Freq: Every day | ORAL | 0 refills | Status: AC

## 2021-11-19 MED ORDER — DOCUSATE SODIUM 100 MG PO CAPS: 100.0000 mg | ORAL_CAPSULE | Freq: Every day | ORAL | 0 refills | Status: DC

## 2021-11-19 MED ORDER — ASCORBIC ACID 1000 MG PO TABS: 1000.0000 mg | ORAL_TABLET | Freq: Every day | ORAL | 0 refills | Status: AC

## 2021-11-19 MED ORDER — ONDANSETRON HCL 4 MG PO TABS: 4.0000 mg | ORAL_TABLET | Freq: Four times a day (QID) | ORAL | 0 refills | Status: DC | PRN

## 2021-11-19 MED ORDER — OXYCODONE HCL 5 MG PO TABS
5.0000 mg | ORAL_TABLET | ORAL | 0 refills | Status: DC | PRN
Start: 2021-11-19 — End: 2021-11-22

## 2021-11-19 NOTE — Plan of Care (Signed)

## 2021-11-19 NOTE — TOC Transition Note (Signed)
Transition of Care [facility]) - CM/SW Discharge Note   Patient Details  Name: Raymond [name] MRN: [id number] Date of Birth: [birth year]  [facility] CM/SW Contact:  [name], RN Phone Number: 11/19/2021, 4:16 PM  Late entry:  Clinical Narrative:    Patient will DC to: home Anticipated DC date: 11/19/2021 Family notified: yes Transport by: car    S/p R BKA Per MD patient ready for DC today.RN, patient, and patient's family aware of DC.Pt agreeable to home health service. Pt without preference. Referral made with [name] and accepted. DME: W/C, BSC, SLIDING BOARD, SHOWER CHAIR  will be delivered to bedside by [facility]. RW will be delivered to pt's home. Wife states family will assist and care for pt once discharged. Post hospital f/u noted on AVS.  Wife to provide transportation to home.  RNCM will sign off for now as intervention is no longer needed. Please consult [place] again if new needs arise.    Final next level of care: Home w Home Health Services Barriers to Discharge: No Barriers Identified   Patient Goals and CMS Choice     Choice offered to / list presented to : Patient  Discharge Placement                       Discharge Plan and Services                DME Arranged: 3-N-1, Walker rolling, Wheelchair manual, Other see comment (showerchair , sliding board) DME Agency: [facility] Date DME Agency Contacted: 11/19/21 Time DME Agency Contacted: [phone] Representative spoke with at DME Agency: Beola Cord HH Arranged: PT, OT HH Agency: [facility] Date [facility] Agency Contacted: 11/19/21 Time HH Agency Contacted: [phone] Representative spoke with at [facility] Agency: Amy  Social Determinants of Health (SDOH) Interventions     Readmission Risk Interventions    08/15/2021    1:24 PM  Readmission Risk Prevention Plan  Transportation Screening Complete  PCP or Specialist Appt within 5-7 Days Complete  Home Care Screening  Complete

## 2021-11-19 NOTE — Progress Notes (Signed)
Discharge instructions reviewed and medication details discussed with patient. Verbalized understanding of all instructions. He is discharged with wound vac and is to have home health follow-up for care. Discharged alert and oriented x4.

## 2021-11-19 NOTE — Progress Notes (Signed)
Physical Therapy Treatment Patient Details Name: Raymond [name] MRN: [id number] DOB: Nov 14, 1960 Today's Date: 11/19/2021   History of Present Illness Pt is a 61 yo male s/p RIGHT AMPUTATION BELOW KNEE WITH WOUND VAC APPLICATION due to non healing foot abcess. PHMx:DM2, prior ESRD s/p renal transplant on immunosuppressive meds, HTN    PT Comments    Continuing work on functional mobility and activity tolerance;  Wife present for session, and session focused on transfers, wc mobility, and stair training in prep for dc home; Educated pt and wife re: exercises to prep for prosthesis, and options for stairs; Ultimately shower chair technqiue will be best for Mr. Stemler to get up and down the stairs to enter his home at this point; discussed car transfer; Questions solicited and answered; Notified Team of equipment recs  Recommendations for follow up therapy are one component of a multi-disciplinary discharge planning process, led by the attending physician.  Recommendations may be updated based on patient status, additional functional criteria and insurance authorization.  Follow Up Recommendations  Home health PT     Assistance Recommended at Discharge Intermittent Supervision/Assistance  Patient can return home with the following A little help with walking and/or transfers;Assistance with cooking/housework;Assist for transportation;Help with stairs or ramp for entrance   Equipment Recommendations  Rolling walker (2 wheels);BSC/3in1;Wheelchair (measurements PT) (sliding board, shower chair)    Recommendations for Other Services OT consult     Precautions / Restrictions Precautions Precautions: Fall Precaution Comments: limb protector Restrictions Weight Bearing Restrictions: Yes RLE Weight Bearing: Non weight bearing     Mobility  Bed Mobility Overal bed mobility: Modified Independent Bed Mobility: Supine to Sit           General bed mobility comments: Inefficient,  taxing movement, but did not need physical assist; Has an adjustable bed at home    Transfers Overall transfer level: Needs assistance Equipment used: Rolling walker (2 wheels) Transfers: Sit to/from Stand, Bed to chair/wheelchair/BSC Sit to Stand: Min guard Stand pivot transfers: Min guard         General transfer comment: Hands on gait belt for safety during transfers; mod cues for hand placement and safety; Wife present and demonstrated good use of gait belt and guarding for safety; performed sit to stand and pivot transfers, including stnading from shower chair on steps, and pivoting from shower chair on steps to wheelchair on floor beside steps    Ambulation/Gait Ambulation/Gait assistance: Min assist Gait Distance (Feet): 10 Feet Assistive device: Rolling walker (2 wheels) Gait Pattern/deviations:  (Hop-to)       General Gait Details: RW too far in front of pt at times; wife aware   Stairs Stairs: Yes Stairs assistance: Min assist Stair Management: Two rails, Step to pattern (also shower chair method) Number of Stairs: 4 (x2) General stair comments: Difficulty getting up and down steps on just the LLE; Discussed options for stairs with pt and wife  including negotiating stairs using a shower chair (with a short leg side and a long leg side to give pt a relatively level seat on the steps); practiced this technqiue and overall did well   Wheelchair Mobility  Educated pt in wheelchair management, propulsion (including using L foot to help with steering); Wheeled self form 5N20 to ortho gym  Modified Rankin (Stroke Patients Only)       Balance     Sitting balance-Leahy Scale: Fair       Standing balance-Leahy Scale: Poor  Cognition Arousal/Alertness: Awake/alert Behavior During Therapy: WFL for tasks assessed/performed, Impulsive (slightly impulsive) Overall Cognitive Status:  (Seems to be at baseline; Wife did not  indicate his status is different than usual) Area of Impairment: Memory                     Memory: Decreased short-term memory   Safety/Judgement: Decreased awareness of safety     General Comments: Did not remember exercises we did yesterday        Exercises Other Exercises Other Exercises: Hamstring stretching RLE seated with hold x3 Other Exercises: Bridging with RLE/knee bolstered x 5 reps Other Exercises: R Hip extension in L sidelying, x5 reps; manual cues for form    General Comments General comments (skin integrity, edema, etc.): Wife present and participating in decision-making/problem-solving, especially with stairs      Pertinent Vitals/Pain Pain Assessment Pain Assessment: Faces Faces Pain Scale: Hurts a little bit Pain Location: R residual limb Pain Descriptors / Indicators: Grimacing Pain Intervention(s): Premedicated before session    Home Living                          Prior Function            PT Goals (current goals can now be found in the care plan section) Acute Rehab PT Goals Patient Stated Goal: Pt and wife feel confident with going home after lenghty PT session today PT Goal Formulation: With patient Time For Goal Achievement: 12/01/21 Potential to Achieve Goals: Good Progress towards PT goals: Progressing toward goals    Frequency    Min 3X/week      PT Plan Current plan remains appropriate    Co-evaluation              AM-PAC PT "6 Clicks" Mobility   Outcome Measure  Help needed turning from your back to your side while in a flat bed without using bedrails?: None Help needed moving from lying on your back to sitting on the side of a flat bed without using bedrails?: None Help needed moving to and from a bed to a chair (including a wheelchair)?: A Little Help needed standing up from a chair using your arms (e.g., wheelchair or bedside chair)?: A Little Help needed to walk in hospital room?: A  Little Help needed climbing 3-5 steps with a railing? : A Little 6 Click Score: 20    End of Session Equipment Utilized During Treatment: Gait belt Activity Tolerance: Patient tolerated treatment well Patient left: in bed;with call bell/phone within reach Nurse Communication: Mobility status (equipment needs) PT Visit Diagnosis: Unsteadiness on feet (R26.81);Other abnormalities of gait and mobility (R26.89)     Time: 4098-1191 PT Time Calculation (min) (ACUTE ONLY): 51 min  Charges:  $Gait Training: 23-37 mins $Therapeutic Activity: 8-22 mins                     Van Clines, PT  Acute Rehabilitation Services Office [phone]    Levi Aland 11/19/2021, 1:30 PM

## 2021-11-20 ENCOUNTER — Encounter (HOSPITAL_COMMUNITY): Payer: Self-pay | Admitting: Orthopedic Surgery

## 2021-11-20 ENCOUNTER — Telehealth: Payer: Self-pay | Admitting: Cardiovascular Disease

## 2021-11-20 LAB — CULTURE, BLOOD (ROUTINE X 2)
Culture: NO GROWTH
Culture: NO GROWTH
Special Requests: ADEQUATE
Special Requests: ADEQUATE

## 2021-11-20 NOTE — Telephone Encounter (Signed)
Wife was calling to see about the patient therapy he is suppose to be getting. Please advise

## 2021-11-20 NOTE — Telephone Encounter (Signed)
Wife calling for information on patient's hospital discharge (Rt. BKA).appointments. Gave her the number to [facility] and for August 16th appointment with Dr. Gwenlyn Found.

## 2021-11-21 ENCOUNTER — Encounter: Payer: Self-pay | Admitting: Nurse Practitioner

## 2021-11-21 ENCOUNTER — Encounter: Payer: 59 | Admitting: Orthopedic Surgery

## 2021-11-21 ENCOUNTER — Telehealth: Payer: Self-pay

## 2021-11-21 NOTE — Telephone Encounter (Signed)
Transition Care Management Follow-up Telephone Call Date of discharge and from where: 11/19/2021 [facility]  How have you been since you were released from the hospital? Pt reports he feels as if he is " distant" he reports feeling light headed & dizzy.  Any questions or concerns? No  Items Reviewed: Did the pt receive and understand the discharge instructions provided? Yes  Medications obtained and verified? Yes  Other? Yes  Any new allergies since your discharge? No  Dietary orders reviewed? Yes Do you have support at home? Yes   Home Care and Equipment/Supplies: Were home health services ordered? yes If so, what is the name of the agency? N/a he does not remember  Has the agency set up a time to come to the patient's home? no Were any new equipment or medical supplies ordered?  No What is the name of the medical supply agency? N/a Were you able to get the supplies/equipment? no Do you have any questions related to the use of the equipment or supplies? No  Functional Questionnaire: (I = Independent and D = Dependent) ADLs: i Bathing/Dressing- i  Meal Prep- i  Eating- i  Maintaining continence- i  Transferring/Ambulation- i  Managing Meds- i  Follow up appointments reviewed:  PCP Hospital f/u appt confirmed? No  Scheduled to see janece moore on n/a @ n/a. [facility] f/u appt confirmed? No  Scheduled to see n/a on n/a @ n/a. Are transportation arrangements needed? No  If their condition worsens, is the pt aware to call PCP or go to the Emergency Dept.? Yes Was the patient provided with contact information for the PCP's office or ED? Yes Was to pt encouraged to call back with questions or concerns? Yes

## 2021-11-22 ENCOUNTER — Encounter: Payer: Self-pay | Admitting: Family

## 2021-11-22 ENCOUNTER — Emergency Department (HOSPITAL_COMMUNITY)
Admission: EM | Admit: 2021-11-22 | Discharge: 2021-11-22 | Discharge status: Against Medical Advice | Payer: 59 | Attending: Student | Admitting: Student

## 2021-11-22 ENCOUNTER — Telehealth: Payer: Self-pay

## 2021-11-22 ENCOUNTER — Ambulatory Visit (INDEPENDENT_AMBULATORY_CARE_PROVIDER_SITE_OTHER): Payer: 59 | Admitting: Family

## 2021-11-22 ENCOUNTER — Other Ambulatory Visit: Payer: Self-pay

## 2021-11-22 DIAGNOSIS — Z5321 Procedure and treatment not carried out due to patient leaving prior to being seen by health care provider: Secondary | ICD-10-CM | POA: Insufficient documentation

## 2021-11-22 DIAGNOSIS — Z4800 Encounter for change or removal of nonsurgical wound dressing: Secondary | ICD-10-CM | POA: Insufficient documentation

## 2021-11-22 DIAGNOSIS — Z89511 Acquired absence of right leg below knee: Secondary | ICD-10-CM

## 2021-11-22 LAB — COMPREHENSIVE METABOLIC PANEL
ALT: 10 U/L (ref 0–44)
AST: 15 U/L (ref 15–41)
Albumin: 2 g/dL — ABNORMAL LOW (ref 3.5–5.0)
Alkaline Phosphatase: 78 U/L (ref 38–126)
Anion gap: 9 (ref 5–15)
BUN: 36 mg/dL — ABNORMAL HIGH (ref 8–23)
CO2: 22 mmol/L (ref 22–32)
Calcium: 9.5 mg/dL (ref 8.9–10.3)
Chloride: 107 mmol/L (ref 98–111)
Creatinine, Ser: 1.3 mg/dL — ABNORMAL HIGH (ref 0.61–1.24)
GFR, Estimated: >60 mL/min (ref >60)
Glucose, Bld: 256 mg/dL — ABNORMAL HIGH (ref 70–99)
Potassium: 5.2 mmol/L — ABNORMAL HIGH (ref 3.5–5.1)
Sodium: 138 mmol/L (ref 135–145)
Total Bilirubin: 0.5 mg/dL (ref 0.3–1.2)
Total Protein: 8.3 g/dL — ABNORMAL HIGH (ref 6.5–8.1)

## 2021-11-22 LAB — CBC WITH DIFFERENTIAL/PLATELET
Abs Immature Granulocytes: 0.04 10*3/uL (ref 0.00–0.07)
Basophils Absolute: 0 10*3/uL (ref 0.0–0.1)
Basophils Relative: 0 %
Eosinophils Absolute: 0.1 10*3/uL (ref 0.0–0.5)
Eosinophils Relative: 2 %
HCT: 30.9 % — ABNORMAL LOW (ref 39.0–52.0)
Hemoglobin: 8.5 g/dL — ABNORMAL LOW (ref 13.0–17.0)
Immature Granulocytes: 1 %
Lymphocytes Relative: 17 %
Lymphs Abs: 1 10*3/uL (ref 0.7–4.0)
MCH: 21.6 pg — ABNORMAL LOW (ref 26.0–34.0)
MCHC: 27.5 g/dL — ABNORMAL LOW (ref 30.0–36.0)
MCV: 78.6 fL — ABNORMAL LOW (ref 80.0–100.0)
Monocytes Absolute: 0.3 10*3/uL (ref 0.1–1.0)
Monocytes Relative: 4 %
Neutro Abs: 4.6 10*3/uL (ref 1.7–7.7)
Neutrophils Relative %: 76 %
Platelets: 444 10*3/uL — ABNORMAL HIGH (ref 150–400)
RBC: 3.93 MIL/uL — ABNORMAL LOW (ref 4.22–5.81)
RDW: 18 % — ABNORMAL HIGH (ref 11.5–15.5)
WBC: 6 10*3/uL (ref 4.0–10.5)
nRBC: 0 % (ref 0.0–0.2)

## 2021-11-22 MED ORDER — OXYCODONE HCL 5 MG PO TABS: 5.0000 mg | ORAL_TABLET | ORAL | 0 refills | Status: DC | PRN

## 2021-11-22 MED ORDER — GABAPENTIN 300 MG PO CAPS: 900.0000 mg | ORAL_CAPSULE | Freq: Three times a day (TID) | ORAL | 3 refills | Status: DC

## 2021-11-22 NOTE — ED Notes (Signed)
Pt is not answering, name has been called for the past hour in a half.

## 2021-11-22 NOTE — ED Notes (Signed)
Called for patient now for over 60 minutes. Can not locate patient

## 2021-11-22 NOTE — Consult Note (Addendum)
[facility] Nurse Consult Note: Reason for Consult: wound vac not working Attempted to see patient in the ED however he was not found in the waiting area by any staff. It appears that he left and went to Dr. Jess Barters office (See note from Dondra Prader, NP) [place] will sign off at this time.   Thank you for the consult. [facility] nurse will not follow at this time.   Please re-consult the [facility] team if needed.  Cathlean Marseilles Tamala Julian, MSN, RN, [place], Lysle Pearl, [facility] Wound Treatment Associate Pager [phone]

## 2021-11-22 NOTE — ED Provider Notes (Signed)
Call to wound care nurse to assess wound vac, patient not found in lobby.    Tacy Learn, PA-C 97/18/20 [id number]    Gray, [place], DO 89/34/06 [phone]

## 2021-11-22 NOTE — Progress Notes (Signed)
Post-Op Visit Note   Patient: Raymond [name]           Date of Birth: 01-31-1961           MRN: [id number] Visit Date: 11/22/2021 PCP: [name], FNP  Chief Complaint:  Chief Complaint  Patient presents with   Right Leg - Routine Post Op    11/16/2021 right BKA kerecis graft     HPI:  HPI The patient is a 61 year old gentleman seen status post right below-knee amputation June 24 he did present to the emergency department yesterday due to uncontrolled pain he is currently using oxycodone 5 mg every 4 hours.  Feels this is inadequate.  He also endorses 900 mg of gabapentin twice daily for neuropathic pain.  Difficulty qualifying the pain he is having in his right residual limb denies pain in his foot denies phantom pain however states this is a pain he is never felt before burning shooting stabbing pain unable to sleep Ortho Exam On examination of the right residual limb this is well upon approximated with staples there is no gaping no drainage no erythema  Visit Diagnoses: No diagnosis found.  Plan: Begin daily Dial soap cleansing.  Dry dressings.  Continue shrinker.  Follow-Up Instructions: No follow-ups on file.   Imaging: No results found.  Orders:  No orders of the defined types were placed in this encounter.  No orders of the defined types were placed in this encounter.    PMFS History: Patient Active Problem List   Diagnosis Date Noted   Cutaneous abscess of right foot    Hyperkalemia 08/28/2021   Obesity (BMI 30-39.9) 08/28/2021   History of anemia due to chronic kidney disease 08/28/2021   Diabetic foot ulcer ([place]) 08/14/2021   Hypomagnesemia 06/25/2021   Septic arthritis of elbow, left ([place]) 06/24/2021   Peripheral vascular disease ([name]) 04/24/2020   Pyogenic inflammation of bone (HCC)    Thrombophlebitis of superficial veins of right lower extremity    Difficult intravenous access    MRSA bacteremia    Diabetic foot infection ([place])     Infected wound 01/12/2020   Critical lower limb ischemia ([place]) 02/02/2019   Personal history of noncompliance with medical treatment, presenting hazards to health 10/07/2018   Critical limb ischemia with history of revascularization of same extremity ([place]) 07/27/2018   Mixed hyperlipidemia 02/19/2018   History of renal transplant 02/19/2018   Conductive hearing loss, bilateral 08/31/2017   Nuclear sclerotic cataract of right eye 10/07/2016   Pseudophakia of left eye 10/07/2016   Hypertension due to endocrine disorder 03/04/2016   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 ([place]) 03/04/2016   Renal failure 03/04/2016   Proliferative diabetic retinopathy with macular edema associated with type 2 diabetes mellitus ([place]) 03/04/2016   Immunosuppression ([place]) 12/10/2015   Nausea vomiting and diarrhea 12/10/2015   Type 2 diabetes mellitus ([place]) 12/10/2015   Essential hypertension, benign 12/10/2015   Pancytopenia ([place]) 12/10/2015   Encounter for screening colonoscopy 11/15/2014   GERD (gastroesophageal reflux disease) 11/15/2014   Past Medical History:  Diagnosis Date   Diabetes ([place])    Diabetes mellitus without complication ([place])    type 2   ESRD (end stage renal disease) ([place])    03/09/2015- patient had a kidney transplant   Hypertension    Peripheral vascular disease ([place])    Renal disorder    Renal insufficiency     Family History  Problem Relation Age  of Onset   Diabetes Mother    Heart disease Father    Colon cancer Neg Hx     Past Surgical History:  Procedure Laterality Date   ABDOMINAL AORTOGRAM W/LOWER EXTREMITY N/A 09/11/2021   Procedure: ABDOMINAL AORTOGRAM W/LOWER EXTREMITY;  Surgeon: Wellington Hampshire, MD;  Location: [facility];  Service: Cardiovascular;  Laterality: N/A;   AMPUTATION Right 09/25/2021   Procedure: RIGHT TRANSMETATARSAL AMPUTATION AND APPLY TISSUE GRAFT;  Surgeon: Newt Minion,  MD;  Location: [facility];  Service: Orthopedics;  Laterality: Right;   AMPUTATION Right 11/16/2021   Procedure: RIGHT AMPUTATION BELOW KNEE WITH WOUND VAC APPLICATION;  Surgeon: Newt Minion, MD;  Location: [facility];  Service: Orthopedics;  Laterality: Right;   BONE BIOPSY Right 03/06/2021   Procedure: BONE BIOPSY;  Surgeon: Trula Slade, DPM;  Location: [facility];  Service: Podiatry;  Laterality: Right;   CENTRAL VENOUS CATHETER INSERTION Right 01/20/2020   Procedure: INSERTION CENTRAL LINE ADULT; Tunneled central line;  Surgeon: Virl Cagey, MD;  Location: [facility];  Service: General;  Laterality: Right;   COLONOSCOPY N/A 12/05/2014   MPN:TIRWERXV external and internal hemorrhoid/mild diverticulosis/11 polyps removed   ESOPHAGOGASTRODUODENOSCOPY N/A 12/05/2014   QMG:QQPY duodenitis   GRAFT APPLICATION Right 19/50/9326   Procedure: GRAFT APPLICATION;  Surgeon: Trula Slade, DPM;  Location[facility];  Service: Podiatry;  Laterality: Right;   INCISION AND DRAINAGE OF WOUND Right 08/15/2021   Procedure: IRRIGATION AND DEBRIDEMENT WOUND;  Surgeon: Landis Martins, DPM;  Location[facility];  Service: Podiatry;  Laterality: Right;  need to make card not  a irrigation and debridement card   IR FLUORO GUIDE CV LINE RIGHT  03/01/2019   IR PERC TUN PERIT CATH WO PORT S&I /IMAG  06/27/2021   IR REMOVAL TUN CV CATH W/O FL  05/10/2019   IR REMOVAL TUN CV CATH W/O FL  02/29/2020   IR REMOVAL TUN CV CATH W/O FL  07/19/2021   IR US GUIDE VASC ACCESS RIGHT  03/01/2019   IR US GUIDE VASC ACCESS RIGHT  06/27/2021   IRRIGATION AND DEBRIDEMENT ELBOW Left 06/24/2021   Procedure: IRRIGATION AND DEBRIDEMENT ELBOW;  Surgeon: Lovell Sheehan, MD;  Location: [facility];  Service: Orthopedics;  Laterality: Left;   KIDNEY TRANSPLANT  03/27/2015   Penile Pump Insertion     PERIPHERAL VASCULAR ATHERECTOMY  09/11/2021   Procedure: PERIPHERAL VASCULAR ATHERECTOMY;  Surgeon: Wellington Hampshire, MD;  Location: [facility];   Service: Cardiovascular;;  Shockwave Lithotripsy   TEE WITHOUT CARDIOVERSION N/A 01/17/2020   Procedure: TRANSESOPHAGEAL ECHOCARDIOGRAM (TEE) WITH PROPOFOL;  Surgeon: Arnoldo Lenis, MD;  [facility];  Service: Endoscopy;  Laterality: N/A;   WOUND DEBRIDEMENT Right 03/06/2021   Procedure: DEBRIDEMENT WOUND;  Surgeon: Trula Slade, DPM;  [facility];  Service: Podiatry;  Laterality: Right;   Social History   Occupational History   Not on file  Tobacco Use   Smoking status: Never   Smokeless tobacco: Never  Vaping Use   Vaping Use: Never used  Substance and Sexual Activity   Alcohol use: No    Alcohol/week: 0.0 standard drinks of alcohol   Drug use: No   Sexual activity: Yes

## 2021-11-22 NOTE — ED Triage Notes (Signed)
Pt endorses increase pain since R BKA surgery on Sunday. Pt states pain unrelieved by pain meds given post op. Pt also states that wound vac has not drained since surgery.   HX: DM, HTN, previous kidney transplant

## 2021-11-22 NOTE — Telephone Encounter (Signed)
Prior auth for Oxycodone written today sent through cover my meds to [employer]. Will hold pending approval.

## 2021-11-22 NOTE — Addendum Note (Signed)
Addended by: Dondra Prader R on: 11/22/2021 11:47 AM   Modules accepted: Orders

## 2021-11-25 ENCOUNTER — Other Ambulatory Visit: Payer: Self-pay | Admitting: Orthopedic Surgery

## 2021-11-25 ENCOUNTER — Telehealth: Payer: Self-pay | Admitting: Orthopedic Surgery

## 2021-11-25 MED ORDER — OXYCODONE HCL 5 MG PO TABS: 5.0000 mg | ORAL_TABLET | ORAL | 0 refills | Status: DC | PRN

## 2021-11-25 NOTE — Telephone Encounter (Signed)
Sabra from [facility] called. Says patient does not need home PT. Also needs pain medication called in to the pharmacy. Her call back number is [phone]

## 2021-11-25 NOTE — Telephone Encounter (Signed)
Can you please resubmit the rx written for oxycodone 5 mg on Friday and change the QTy to #45 the insurance company will not allow for a qty of 60 for a 5 day supply but will auth the 45 without prior approval. Pt is s/p a right BKA 11/16/2021 send message back to me once complete so that I can notify the pt.     Called again and was able to sw "jeff" with Massie Kluver and he advised that the qty #60 for a 5 day supply was throwing the rejection and that the pt would be approved for a QTY of 45 with out needing a prior auth this rx can be resubmitted to pharmacy with these changes.

## 2021-11-25 NOTE — Telephone Encounter (Signed)
I called and sw pt to advise of the authorization issue, explain the qty decrease and let him know that the rx has been sent to the pharm.

## 2021-11-25 NOTE — Telephone Encounter (Signed)
Oxycodone is pending approval on [email or web address]

## 2021-11-25 NOTE — Telephone Encounter (Signed)
Gabapentin was approved today through cover my meds but Oxycodone requires additional information. Tried to call [employer] at [phone] message advised exceptionally long wait times and to try again later or try online at [email or web address] this web site brings me right back to cover my meds. Will hold and try to call again.

## 2021-11-27 ENCOUNTER — Encounter: Payer: 59 | Admitting: Family

## 2021-11-28 ENCOUNTER — Other Ambulatory Visit: Payer: Self-pay

## 2021-11-28 ENCOUNTER — Emergency Department
Admission: EM | Admit: 2021-11-28 | Discharge: 2021-11-28 | Disposition: A | Discharge status: Home / Self Care | Payer: 59 | Attending: Emergency Medicine | Admitting: Emergency Medicine

## 2021-11-28 DIAGNOSIS — R739 Hyperglycemia, unspecified: Secondary | ICD-10-CM

## 2021-11-28 DIAGNOSIS — D649 Anemia, unspecified: Secondary | ICD-10-CM | POA: Insufficient documentation

## 2021-11-28 DIAGNOSIS — E1165 Type 2 diabetes mellitus with hyperglycemia: Secondary | ICD-10-CM | POA: Diagnosis not present

## 2021-11-28 DIAGNOSIS — E1122 Type 2 diabetes mellitus with diabetic chronic kidney disease: Secondary | ICD-10-CM | POA: Insufficient documentation

## 2021-11-28 DIAGNOSIS — Z794 Long term (current) use of insulin: Secondary | ICD-10-CM | POA: Insufficient documentation

## 2021-11-28 DIAGNOSIS — N186 End stage renal disease: Secondary | ICD-10-CM | POA: Diagnosis not present

## 2021-11-28 DIAGNOSIS — I120 Hypertensive chronic kidney disease with stage 5 chronic kidney disease or end stage renal disease: Secondary | ICD-10-CM | POA: Insufficient documentation

## 2021-11-28 DIAGNOSIS — E875 Hyperkalemia: Secondary | ICD-10-CM | POA: Diagnosis not present

## 2021-11-28 DIAGNOSIS — R799 Abnormal finding of blood chemistry, unspecified: Secondary | ICD-10-CM | POA: Diagnosis not present

## 2021-11-28 DIAGNOSIS — I10 Essential (primary) hypertension: Secondary | ICD-10-CM | POA: Diagnosis not present

## 2021-11-28 LAB — URINALYSIS, COMPLETE (UACMP) WITH MICROSCOPIC
Bilirubin Urine: NEGATIVE
Glucose, UA: >=500 mg/dL — AB
Hgb urine dipstick: NEGATIVE
Ketones, ur: NEGATIVE mg/dL
Leukocytes,Ua: NEGATIVE
Nitrite: NEGATIVE
Protein, ur: NEGATIVE mg/dL
Specific Gravity, Urine: 1.007 (ref 1.005–1.030)
pH: 5 (ref 5.0–8.0)

## 2021-11-28 LAB — BASIC METABOLIC PANEL
Anion gap: 4 — ABNORMAL LOW (ref 5–15)
Anion gap: 5 (ref 5–15)
BUN: 26 mg/dL — ABNORMAL HIGH (ref 8–23)
BUN: 27 mg/dL — ABNORMAL HIGH (ref 8–23)
CO2: 24 mmol/L (ref 22–32)
CO2: 24 mmol/L (ref 22–32)
Calcium: 9.3 mg/dL (ref 8.9–10.3)
Calcium: 9.1 mg/dL (ref 8.9–10.3)
Chloride: 106 mmol/L (ref 98–111)
Chloride: 106 mmol/L (ref 98–111)
Creatinine, Ser: 1.29 mg/dL — ABNORMAL HIGH (ref 0.61–1.24)
Creatinine, Ser: 1.24 mg/dL (ref 0.61–1.24)
GFR, Estimated: >60 mL/min (ref >60)
GFR, Estimated: >60 mL/min (ref >60)
Glucose, Bld: 421 mg/dL — ABNORMAL HIGH (ref 70–99)
Glucose, Bld: 322 mg/dL — ABNORMAL HIGH (ref 70–99)
Potassium: 6.4 mmol/L (ref 3.5–5.1)
Potassium: 5.3 mmol/L — ABNORMAL HIGH (ref 3.5–5.1)
Sodium: 134 mmol/L — ABNORMAL LOW (ref 135–145)
Sodium: 135 mmol/L (ref 135–145)

## 2021-11-28 LAB — CBC WITH DIFFERENTIAL/PLATELET
Abs Immature Granulocytes: 0.02 10*3/uL (ref 0.00–0.07)
Basophils Absolute: 0 10*3/uL (ref 0.0–0.1)
Basophils Relative: 1 %
Eosinophils Absolute: 0.1 10*3/uL (ref 0.0–0.5)
Eosinophils Relative: 3 %
HCT: 30.4 % — ABNORMAL LOW (ref 39.0–52.0)
Hemoglobin: 8.4 g/dL — ABNORMAL LOW (ref 13.0–17.0)
Immature Granulocytes: 1 %
Lymphocytes Relative: 26 %
Lymphs Abs: 1.1 10*3/uL (ref 0.7–4.0)
MCH: 21.6 pg — ABNORMAL LOW (ref 26.0–34.0)
MCHC: 27.6 g/dL — ABNORMAL LOW (ref 30.0–36.0)
MCV: 78.4 fL — ABNORMAL LOW (ref 80.0–100.0)
Monocytes Absolute: 0.3 10*3/uL (ref 0.1–1.0)
Monocytes Relative: 6 %
Neutro Abs: 2.7 10*3/uL (ref 1.7–7.7)
Neutrophils Relative %: 63 %
Platelets: 422 10*3/uL — ABNORMAL HIGH (ref 150–400)
RBC: 3.88 MIL/uL — ABNORMAL LOW (ref 4.22–5.81)
RDW: 19 % — ABNORMAL HIGH (ref 11.5–15.5)
WBC: 4.2 10*3/uL (ref 4.0–10.5)
nRBC: 0 % (ref 0.0–0.2)

## 2021-11-28 LAB — PHOSPHORUS: Phosphorus: 2.9 mg/dL (ref 2.5–4.6)

## 2021-11-28 LAB — MAGNESIUM: Magnesium: 1.4 mg/dL — ABNORMAL LOW (ref 1.7–2.4)

## 2021-11-28 LAB — CBG MONITORING, ED: Glucose-Capillary: 379 mg/dL — ABNORMAL HIGH (ref 70–99)

## 2021-11-28 MED ORDER — LACTATED RINGERS IV BOLUS
1000.0000 mL | Freq: Once | INTRAVENOUS | Status: AC
  Administered 2021-11-28: 1000 mL via INTRAVENOUS

## 2021-11-28 MED ORDER — BACITRACIN ZINC 500 UNIT/GM EX OINT
TOPICAL_OINTMENT | CUTANEOUS | Status: AC
  Administered 2021-11-28: 3 via TOPICAL
  Filled 2021-11-28: qty 2.7

## 2021-11-28 MED ORDER — MAGNESIUM SULFATE 2 GM/50ML IV SOLN
2.0000 g | Freq: Once | INTRAVENOUS | Status: AC
  Administered 2021-11-28: 2 g via INTRAVENOUS
  Filled 2021-11-28: qty 50

## 2021-11-28 MED ORDER — FUROSEMIDE 10 MG/ML IJ SOLN
20.0000 mg | Freq: Once | INTRAMUSCULAR | Status: AC
  Administered 2021-11-28: 20 mg via INTRAVENOUS
  Filled 2021-11-28: qty 4

## 2021-11-28 MED ORDER — MAGNESIUM OXIDE 400 MG PO CAPS: 2.0000 | ORAL_CAPSULE | Freq: Every day | ORAL | 0 refills | Status: DC

## 2021-11-28 MED ORDER — INSULIN ASPART 100 UNIT/ML IJ SOLN
0.0000 [IU] | INTRAMUSCULAR | Status: DC
  Administered 2021-11-28: 15 [IU] via SUBCUTANEOUS
  Filled 2021-11-28: qty 1

## 2021-11-28 MED ORDER — SODIUM ZIRCONIUM CYCLOSILICATE 10 G PO PACK
10.0000 g | PACK | Freq: Once | ORAL | Status: AC
Start: 2021-11-28 — End: 2021-11-28
  Administered 2021-11-28: 10 g via ORAL
  Filled 2021-11-28: qty 1

## 2021-11-28 NOTE — ED Provider Notes (Signed)
[facility] Provider Note    Event Date/Time   First MD Initiated Contact with Patient 11/28/21 1946     (approximate)   History   Abnormal Lab   HPI  Arieh Bogue is a 61 y.o. male  with PMH of DM, ESRD s/p postrenal transplant on immunosuppressive's, HTN, HDL, anemia, obesity and recent R BKA 11/16/2021 2/2 diabetic infection presents for evaluation after he had some routine labs done earlier today and was told his phosphorus was elevated.  He states he has no new pain in his right lower extremity where he recently had his amputation or any other acute symptoms including chest pain, cough, shortness of breath, vomiting, diarrhea, abdominal pain back pain rash or any other acute sick symptoms.  He states that his sugar was low this morning and he did not take his insulin and had some food otherwise has been compliant with his medications.  No history of CHF.      Physical Exam  Triage Vital Signs: ED Triage Vitals  Enc Vitals Group     BP 11/28/21 1710 (!) 172/85     Pulse Rate 11/28/21 1710 72     Resp 11/28/21 1710 19     Temp 11/28/21 1710 98.5 F (36.9 C)     Temp Source 11/28/21 1710 Oral     SpO2 11/28/21 1710 100 %     Weight --      Height --      Head Circumference --      Peak Flow --      Pain Score 11/28/21 1709 0     Pain Loc --      Pain Edu? --      Excl. in [place]? --     Most recent vital signs: Vitals:   11/28/21 2215 11/28/21 2230  BP:  (!) 144/87  Pulse:    Resp: 17 15  Temp:    SpO2:      General: Awake, no distress.  CV:  Good peripheral perfusion.  2+ radial pulses. Resp:  Normal effort.  Clear bilaterally. Abd:  Soft throughout. Other:  Right lower extremity BKA staples in place and wound appears clean dry and intact.  Mild left lower extremity edema.   ED Results / Procedures / Treatments  Labs (all labs ordered are listed, but only abnormal results are displayed) Labs Reviewed  CBC WITH  DIFFERENTIAL/PLATELET - Abnormal; Notable for the following components:      Result Value   RBC 3.88 (*)    Hemoglobin 8.4 (*)    HCT 30.4 (*)    MCV 78.4 (*)    MCH 21.6 (*)    MCHC 27.6 (*)    RDW 19.0 (*)    Platelets 422 (*)    All other components within normal limits  BASIC METABOLIC PANEL - Abnormal; Notable for the following components:   Sodium 134 (*)    Potassium 6.4 (*)    Glucose, Bld 421 (*)    BUN 26 (*)    Creatinine, Ser 1.29 (*)    Anion gap 4 (*)    All other components within normal limits  MAGNESIUM - Abnormal; Notable for the following components:   Magnesium 1.4 (*)    All other components within normal limits  URINALYSIS, COMPLETE (UACMP) WITH MICROSCOPIC - Abnormal; Notable for the following components:   Color, Urine STRAW (*)    APPearance CLEAR (*)    Glucose, UA >=500 (*)  Bacteria, UA RARE (*)    All other components within normal limits  BASIC METABOLIC PANEL - Abnormal; Notable for the following components:   Potassium 5.3 (*)    Glucose, Bld 322 (*)    BUN 27 (*)    All other components within normal limits  CBG MONITORING, ED - Abnormal; Notable for the following components:   Glucose-Capillary 379 (*)    All other components within normal limits  PHOSPHORUS     EKG  EKG is remarkable for sinus rhythm with a ventricular rate of 70, normal axis, unremarkable intervals without clear evidence of acute ischemia or significant arrhythmia.   RADIOLOGY    PROCEDURES:  Critical Care performed: Yes, see critical care procedure note(s)  .1-3 Lead EKG Interpretation  Performed by: Lucrezia Starch, MD Authorized by: Lucrezia Starch, MD     Interpretation: normal     ECG rate assessment: normal     Rhythm: sinus rhythm     Ectopy: none     Conduction: normal   .Critical Care  Performed by: Lucrezia Starch, MD Authorized by: Lucrezia Starch, MD   Critical care provider statement:    Critical care time (minutes):  30    Critical care was necessary to treat or prevent imminent or life-threatening deterioration of the following conditions:  Metabolic crisis (hyperkalemia requiring lasix, IVF, insulin, lokelma)   Critical care was time spent personally by me on the following activities:  Development of treatment plan with patient or surrogate, discussions with consultants, evaluation of patient's response to treatment, examination of patient, ordering and review of laboratory studies, ordering and review of radiographic studies, ordering and performing treatments and interventions, pulse oximetry, re-evaluation of patient's condition and review of old charts   The patient is on the cardiac monitor to evaluate for evidence of arrhythmia and/or significant heart rate changes.   MEDICATIONS ORDERED IN ED: Medications  insulin aspart (novoLOG) injection 0-15 Units (15 Units Subcutaneous Given 11/28/21 2054)  sodium zirconium cyclosilicate (LOKELMA) packet 10 g (has no administration in time range)  magnesium sulfate IVPB 2 g 50 mL (0 g Intravenous Stopped 11/28/21 2128)  furosemide (LASIX) injection 20 mg (20 mg Intravenous Given 11/28/21 2042)  lactated ringers bolus 1,000 mL (0 mLs Intravenous Stopped 11/28/21 2200)  bacitracin ointment (3 Applications Topical Given 11/28/21 2104)     IMPRESSION / MDM / ASSESSMENT AND PLAN / ED COURSE  I reviewed the triage vital signs and the nursing notes. Patient's presentation is most consistent with acute presentation with potential threat to life or bodily function.                               Presents for evaluation after being told he has elevated phosphorus today.  Labs obtained in triage remarkable for BMP that shows hyperkalemia with a K of 6.4 without evidence of hemolysis, glucose of 421, creatinine of 1.29 compared to 1.36 days ago with normal bicarb and anion gap and no other significant derangements.  CBC shows no cytosis and stable anemia with a hemoglobin of 8.4,8.56  days ago and platelets of 422.  Magnesium is 1.4.  Phosphorus 2.9.  On further questioning patient states that he is not sure if it was phosphorus or potassium that he was told was elevated.  Do not see any record of this phone call her labs from earlier today in the EHR.  UA has greater than 500  glucose and some rare bacteria but otherwise is unremarkable.  EKG is remarkable for sinus rhythm with a ventricular rate of 70, normal axis, unremarkable intervals without clear evidence of acute ischemia or significant arrhythmia.  Please hyperglycemic there is no evidence of acidosis or kidney injury.  Suspect this is related to him not taking his insulin earlier today.  Unclear etiology of his low magnesium and high potassium.  No recent crush injuries or evidence of acidosis.  He states he has no history of heart failure and does not normally take a diuretic and there is no evidence of an acute kidney injury on top of his CKD.  We will replete his magnesium and treat his hyperkalemia with some fluids Lasix and a sliding scale insulin which will also help with his hyperglycemia.  No EKG changes or evidence of arrhythmia at this time.  Repeat BMP shows glucose down to 322 with a creatinine of 1.24 and K of 5.3.  Bicarb of 24 and normal anion gap.  Patient is still him symptomatic and given no arrhythmia improving labs I think he is appropriate for discharge with recheck of his electrolytes tomorrow.  Will write for some additional magnesium oral supplementation for the next couple days.  We will give a dose of Lokelma before he leaves.  Discussed that he must have his potassium and magnesium rechecked in next 24 to 48 hours and that he can call his PCP tomorrow to arrange this otherwise he will need to return to the emergency room.  He has no other acute concerns.  Discharged in stable condition.  Strict return precautions advised and discussed.      FINAL CLINICAL IMPRESSION(S) / ED DIAGNOSES   Final  diagnoses:  Hyperkalemia  Chronic anemia  Hypomagnesemia  Hyperglycemia     Rx / DC Orders   ED Discharge Orders          Ordered    Magnesium Oxide 400 MG CAPS  Daily        11/28/21 2251             Note:  This document was prepared using Dragon voice recognition software and may include unintentional dictation errors.   Lucrezia Starch, MD 11/28/21 2255

## 2021-11-28 NOTE — ED Triage Notes (Signed)
Pt comes with c/o abnormal labs. Pt states his PCP stated his phosphate was at 6.9. Pt denies any symptoms.

## 2021-11-28 NOTE — ED Provider Triage Note (Signed)
Emergency Medicine Provider Triage Evaluation Note  Raymond [name] , a 61 y.o. male  was evaluated in triage.  Pt complains of elevated phosphorus level.  Sent by his physician.  Review of Systems  Positive: See above Negative: Fever chills  Physical Exam  There were no vitals taken for this visit. Gen:   Awake, no distress   Resp:  Normal effort  MSK:   Moves extremities without difficulty  Other:    Medical Decision Making  Medically screening exam initiated at 5:10 PM.  Appropriate orders placed.  Raymond [name] was informed that the remainder of the evaluation will be completed by another provider, this initial triage assessment does not replace that evaluation, and the importance of remaining in the ED until their evaluation is complete.     Raymond Starks, PA-C 11/28/21 1711

## 2021-11-28 NOTE — ED Notes (Signed)
Dose of 15 units insulin verified with Dr. Tamala Julian.

## 2021-11-28 NOTE — ED Notes (Signed)
Date and time results received: 11/28/21 1952 (use smartphrase ".now" to insert current time)  Test: potassium  Critical Value: 6.4  Name of Provider Notified: Tamala Julian, z  Orders Received? Or Actions Taken?: see mar

## 2021-11-29 ENCOUNTER — Other Ambulatory Visit: Payer: Self-pay

## 2021-11-29 ENCOUNTER — Other Ambulatory Visit: Payer: Self-pay | Admitting: Nurse Practitioner

## 2021-11-29 ENCOUNTER — Telehealth: Payer: Self-pay | Admitting: Nurse Practitioner

## 2021-11-29 LAB — BMP8+EGFR
BUN/Creatinine Ratio: 19 (ref 10–24)
BUN: 25 mg/dL (ref 8–27)
CO2: 21 mmol/L (ref 20–29)
Calcium: 9.5 mg/dL (ref 8.6–10.2)
Chloride: 104 mmol/L (ref 96–106)
Creatinine, Ser: 1.34 mg/dL — ABNORMAL HIGH (ref 0.76–1.27)
Glucose: 362 mg/dL — ABNORMAL HIGH (ref 70–99)
Potassium: 5.8 mmol/L — ABNORMAL HIGH (ref 3.5–5.2)
Sodium: 137 mmol/L (ref 134–144)
eGFR: 60 mL/min/{1.73_m2} (ref >59)

## 2021-11-29 LAB — PHOSPHORUS: Phosphorus: 3.4 mg/dL (ref 2.8–4.1)

## 2021-11-29 LAB — BASIC METABOLIC PANEL: Glucose: 362

## 2021-11-29 LAB — MAGNESIUM: Magnesium: 1.5 mg/dL — ABNORMAL LOW (ref 1.6–2.3)

## 2021-11-29 MED ORDER — NOVOLIN N FLEXPEN 100 UNIT/ML ~~LOC~~ SUPN: 60.0000 [IU] | PEN_INJECTOR | Freq: Two times a day (BID) | SUBCUTANEOUS | 1 refills | Status: DC

## 2021-11-29 NOTE — Telephone Encounter (Signed)
Called patient and his wife after reviewing his medication list for his diabetes medication. He has ONLY been taking the Trulicity and Novolin R sliding scale, he has not been taking the Novolin N written by Dr. Tana Coast. He and his wife thought the Novolin R took the place of the Novolin N. I have sent a Rx to the pharmacy and advised to begin taking as directed. He is to continue to check his blood sugar 3-4 times a day as well. Explained having an elevated blood sugar decreases his chance to heal properly. He has an appt on Monday at the office and he has gone to [facility] for repeat labs today. Both he and his wife verbalizes understanding

## 2021-12-02 ENCOUNTER — Encounter: Payer: Self-pay | Admitting: Nurse Practitioner

## 2021-12-02 ENCOUNTER — Ambulatory Visit (INDEPENDENT_AMBULATORY_CARE_PROVIDER_SITE_OTHER): Payer: 59 | Admitting: Nurse Practitioner

## 2021-12-02 VITALS — BP 130/74 | HR 81 | Temp 98.1°F

## 2021-12-02 DIAGNOSIS — Z89511 Acquired absence of right leg below knee: Secondary | ICD-10-CM

## 2021-12-02 DIAGNOSIS — N182 Chronic kidney disease, stage 2 (mild): Secondary | ICD-10-CM

## 2021-12-02 DIAGNOSIS — E1122 Type 2 diabetes mellitus with diabetic chronic kidney disease: Secondary | ICD-10-CM | POA: Diagnosis not present

## 2021-12-02 DIAGNOSIS — Z794 Long term (current) use of insulin: Secondary | ICD-10-CM

## 2021-12-02 MED ORDER — MAGNESIUM OXIDE 400 MG PO CAPS: 2.0000 | ORAL_CAPSULE | Freq: Every day | ORAL | 0 refills | Status: AC

## 2021-12-02 NOTE — Patient Instructions (Addendum)
Hyperkalemia Hyperkalemia is when you have too much of a mineral called potassium in your blood. If there is too much potassium in your blood, it can affect how your heart works. Potassium is normally removed from your body by your kidneys. What are the causes? Taking in too much potassium. This can happen if: You use salt substitutes. You take potassium supplements. You eat too many foods that are high in potassium if you have kidney disease. Your body not being able to get rid of potassium. This can happen if: Your kidneys are not working properly. You are taking certain medicines. You have a condition called Addison's disease. You have kidney stones. You are on treatment to clean your blood (dialysis) and you skip a treatment. Your cells releasing a high amount of potassium into the blood. This can happen if: You have a muscle injury. You have very bad burns or infections. You have problems with your blood plasma. This can be caused by diabetes that is not well controlled. What increases the risk? Drinking too much alcohol. Using drugs a lot. What are the signs or symptoms? In many cases, there are no symptoms. But, when your potassium level becomes high enough, you may have symptoms such as: An irregular heartbeat. A very slow heartbeat. Feeling like you may vomit (nauseous). Tiredness. Confusion. Tingling of your skin. Numbness of your hands or feet. Muscle cramps. Muscle weakness. Not being able to move (paralysis). How is this treated? Treatment depends on how bad your condition is. Treatment may need to be done in the hospital. It may include: Receiving a fluid with sugar (glucose) in it through an IV tube, along with insulin. Taking medicines. Having treatment to clean your blood. Taking calcium. Follow these instructions at home:  Take over-the-counter and prescription medicines only as told by your doctor. Do not take any of the following unless your doctor says it  is okay: Supplements. Natural food products. Herbs. Vitamins. If you drink alcohol, limit how much you have as told by your doctor. Do not use illegal drugs. If you need help quitting, ask your doctor. If you have kidney disease, you may need to follow a low-potassium diet. A food expert (dietitian) can help you. Keep all follow-up visits. Contact a doctor if: Your heartbeat is not regular or is very slow. You feel dizzy. You feel weak. You feel like you may vomit. You have tingling in your hands or feet. You lose feeling in your hands or feet. Get help right away if: You are short of breath. You have chest pain. You faint. You cannot move your muscles. These symptoms may be an emergency. Get help right away. Call 911. Do not wait to see if the symptoms will go away. Do not drive yourself to the hospital. Summary Hyperkalemia is when you have too much potassium in your blood. Take over-the-counter and prescription medicines only as told by your doctor. Limit how much alcohol you have as told by your doctor. Contact a doctor if your heartbeat is not regular. This information is not intended to replace advice given to you by your health care provider. Make sure you discuss any questions you have with your health care provider. Document Revised: 01/24/2021 Document Reviewed: 01/24/2021 Elsevier Patient Education  [place].

## 2021-12-02 NOTE — Progress Notes (Signed)
I,Raymond [name],acting as a Education administrator for [employer], FNP.,have documented all relevant documentation on the behalf of Raymond Brine, FNP,as directed by  Raymond Brine, FNP while in the presence of Raymond Brine, FNP   Subjective:     Patient ID: Raymond [name] , male    DOB: [birth year] , 61 y.o.   MRN: [id number]   Chief Complaint  Patient presents with   Hospitalization Follow-up    HPI  Patient presents today for hospital follow up. He had a right below the knee amputation late June.  He went back to the ER due to pain and come to find out his wound vac was up too high. He goes on July 14th to remove staples. He reports he was seen by PT at home but he is already cutting grass on a riding lawn mower. He reports he is getting around on his own. He reports the PT left after he told them he had to go cut some yards. He has a new ramp at home.   He is now taking Novolog 60 units twice a day since Friday - blood sugar has been averaging 130's.  His appt with Endocrinology later this month.      Past Medical History:  Diagnosis Date   Diabetes ([place])    Diabetes mellitus without complication ([place])    type 2   ESRD (end stage renal disease) ([place])    03/09/2015- patient had a kidney transplant   Hypertension    Peripheral vascular disease ([place])    Renal disorder    Renal insufficiency      Family History  Problem Relation Age of Onset   Diabetes Mother    Heart disease Father    Colon cancer Neg Hx      Current Outpatient Medications:    aspirin EC 81 MG tablet, Take 1 tablet (81 mg total) by mouth daily. Swallow whole., Disp: 150 tablet, Rfl: 0   atorvastatin (LIPITOR) 10 MG tablet, Take 10 mg by mouth daily., Disp: , Rfl:    blood glucose meter kit and supplies KIT, Dispense based on patient and insurance preference. Use up to four times daily as directed. (FOR ICD-9 250.00, 250.01)., Disp: 1 each, Rfl: 0   Blood Glucose Monitoring Suppl (ACCU-CHEK GUIDE ME) w/Device  KIT, 1 Piece by Does not apply route as directed., Disp: 1 kit, Rfl: 0   brimonidine (ALPHAGAN) 0.2 % ophthalmic solution, Place 1 drop into both eyes 3 (three) times daily., Disp: , Rfl:    cloNIDine (CATAPRES - DOSED IN MG/24 HR) 0.3 mg/24hr patch, Place 1 patch (0.3 mg total) onto the skin once a week. (Patient taking differently: Place 0.3 mg onto the skin once a week. Place patch on Sunday), Disp: 4 patch, Rfl: 12   clopidogrel (PLAVIX) 75 MG tablet, Take 1 tablet (75 mg total) by mouth daily., Disp: 30 tablet, Rfl: 6   Continuous Blood Gluc Receiver ([place]) DEVI, Use to check blood sugar 3 times a day. Dx code e11.65, Disp: 1 each, Rfl: 1   Continuous Blood Gluc Sensor (DEXCOM G7 SENSOR) MISC, Use to check blood sugar 3 times a day. Dx code e11.65, Disp: 3 each, Rfl: 3   docusate sodium (COLACE) 100 MG capsule, Take 1 capsule (100 mg total) by mouth daily., Disp: 10 capsule, Rfl: 0   dorzolamide-timolol (COSOPT) 22.3-6.8 MG/ML ophthalmic solution, Place 1 drop into both eyes 2 (two) times daily., Disp: , Rfl:    doxazosin (CARDURA) 4 MG  tablet, Take 4 mg by mouth daily., Disp: , Rfl:    Dulaglutide (TRULICITY) 3.47 QQ/5.9DG SOPN, Inject 0.75 mg into the skin once a week. (Patient taking differently: Inject 0.75 mg into the skin every Sunday.), Disp: 6 mL, Rfl: 3   gabapentin (NEURONTIN) 300 MG capsule, Take 3 capsules (900 mg total) by mouth 3 (three) times daily. 3 times a day, Disp: 270 capsule, Rfl: 3   glucose blood (ACCU-CHEK GUIDE) test strip, Use as instructed 4 x daily. E11.65 (Patient taking differently: 3 (three) times daily.), Disp: 150 each, Rfl: 5   hydrALAZINE (APRESOLINE) 25 MG tablet, Take 25 mg by mouth 2 (two) times daily., Disp: , Rfl:    Insulin NPH, Human,, Isophane, (NOVOLIN N FLEXPEN) 100 UNIT/ML Kiwkpen, Inject 60 Units into the skin in the morning and at bedtime., Disp: 15 mL, Rfl: 1   Insulin Regular Human (NOVOLIN R FLEXPEN RELION) 100 UNIT/ML KwikPen,  Inject 1-9 Units as directed 3 (three) times daily before meals. Sliding scale CBG 70 - 120: 0 units CBG 121 - 150: 1 unit,  CBG 151 - 200: 2 units,  CBG 201 - 250: 3 units,  CBG 251 - 300: 5 units,  CBG 301 - 350: 7 units,  CBG 351 - 400: 9 units   CBG > 400: 9 units and notify your doctor, Disp: 15 mL, Rfl: 1   latanoprost (XALATAN) 0.005 % ophthalmic solution, Place 1 drop into both eyes at bedtime., Disp: , Rfl:    Multiple Vitamin (MULTIVITAMIN) capsule, Take 1 capsule by mouth daily., Disp: , Rfl:    mupirocin ointment (BACTROBAN) 2 %, Apply 1 application. topically 2 (two) times daily. For wound care, Disp: 30 g, Rfl: 0   ondansetron (ZOFRAN) 4 MG tablet, Take 1 tablet (4 mg total) by mouth every 6 (six) hours as needed for nausea., Disp: 20 tablet, Rfl: 0   oxyCODONE (OXY IR/ROXICODONE) 5 MG immediate release tablet, Take 1 tablet (5 mg total) by mouth every 4 (four) hours as needed for severe pain (pain score 4-6)., Disp: 45 tablet, Rfl: 0   oxyCODONE-acetaminophen (PERCOCET/ROXICET) 5-325 MG tablet, Take 1 tablet by mouth every 4 (four) hours as needed. (Patient taking differently: Take 1 tablet by mouth every 4 (four) hours as needed for moderate pain.), Disp: 30 tablet, Rfl: 0   sodium bicarbonate 650 MG tablet, Take 1,300 mg by mouth 3 (three) times daily., Disp: , Rfl:    sodium zirconium cyclosilicate (LOKELMA) 10 g PACK packet, Take 10 g by mouth daily., Disp: 30 packet, Rfl: 1   tacrolimus (PROGRAF) 1 MG capsule, Take 4 capsules (4 mg total) by mouth 2 (two) times daily., Disp: , Rfl:    Vitamin D, Ergocalciferol, (DRISDOL) 50000 units CAPS capsule, Take 50,000 Units by mouth See admin instructions. 15th of the month, Disp: , Rfl:   Current Facility-Administered Medications:    sodium chloride flush (NS) 0.9 % injection 3 mL, 3 mL, Intravenous, Q12H, Arida, Muhammad A, MD   Allergies  Allergen Reactions   Doxycycline     Runs up pt's blood sugar and phosphorus   Bactrim  [Sulfamethoxazole-Trimethoprim]     Runs pt's sugar and phosphorus   Flagyl [Metronidazole] Rash    Patient having hives to either the vancomycin or flagyl (both infusing together).   Vancomycin Rash    Patient having hives to either vancomycin or flagyl     Review of Systems  Constitutional: Negative.   Respiratory: Negative.    Cardiovascular: Negative.  Gastrointestinal: Negative.   Musculoskeletal:        Right BKA   Neurological: Negative.   Psychiatric/Behavioral: Negative.       Today's Vitals   12/02/21 1411  BP: 130/74  Pulse: 81  Temp: 98.1 F (36.7 C)  TempSrc: Oral   There is no height or weight on file to calculate BMI.   Objective:  Physical Exam Vitals reviewed.  Constitutional:      General: He is not in acute distress.    Appearance: Normal appearance. He is obese.  Cardiovascular:     Rate and Rhythm: Normal rate and regular rhythm.     Pulses: Normal pulses.     Heart sounds: Normal heart sounds. No murmur heard. Pulmonary:     Effort: Pulmonary effort is normal. No respiratory distress.     Breath sounds: Normal breath sounds. No wheezing.  Skin:    General: Skin is warm.  Neurological:     General: No focal deficit present.     Mental Status: He is alert and oriented to person, place, and time.     Cranial Nerves: No cranial nerve deficit.     Motor: No weakness.  Psychiatric:        Mood and Affect: Mood normal.        Behavior: Behavior normal.        Thought Content: Thought content normal.        Judgment: Judgment normal.         Assessment And Plan:     1. Type 2 diabetes mellitus with stage 2 chronic kidney disease, with long-term current use of insulin (HCC) Comments: HgbA1c was elevated while in the hospital and is now on insulin, will start on dexcom. Dexcom teaching done  2. Hyperphosphatemia Comments: Phosphorus is normal, he has not picked up the magnesium  3. Hypomagnesemia - Magnesium Oxide 400 MG CAPS; Take 2  capsules (800 mg total) by mouth daily for 3 days.  Dispense: 6 capsule; Refill: 0  4. S/P unilateral BKA (below knee amputation), right ([place]) Comments: Continue follow up with vascular     Patient was given opportunity to ask questions. Patient verbalized understanding of the plan and was able to repeat key elements of the plan. All questions were answered to their satisfaction.  Raymond Brine, FNP   I, Raymond Brine, FNP, have reviewed all documentation for this visit. The documentation on 12/02/21 for the exam, diagnosis, procedures, and orders are all accurate and complete.   IF YOU HAVE BEEN REFERRED TO A SPECIALIST, IT MAY TAKE 1-2 WEEKS TO SCHEDULE/PROCESS THE REFERRAL. IF YOU HAVE NOT HEARD FROM US/SPECIALIST IN TWO WEEKS, PLEASE GIVE [place] A CALL AT [phone] X [phone].   THE PATIENT IS ENCOURAGED TO PRACTICE SOCIAL DISTANCING DUE TO THE COVID-19 PANDEMIC.

## 2021-12-05 ENCOUNTER — Other Ambulatory Visit: Payer: Self-pay | Admitting: Nurse Practitioner

## 2021-12-05 DIAGNOSIS — E1122 Type 2 diabetes mellitus with diabetic chronic kidney disease: Secondary | ICD-10-CM

## 2021-12-06 ENCOUNTER — Encounter: Payer: Self-pay | Admitting: Family

## 2021-12-06 ENCOUNTER — Ambulatory Visit (INDEPENDENT_AMBULATORY_CARE_PROVIDER_SITE_OTHER): Payer: 59 | Admitting: Family

## 2021-12-06 ENCOUNTER — Other Ambulatory Visit: Payer: Self-pay

## 2021-12-06 DIAGNOSIS — Z940 Kidney transplant status: Secondary | ICD-10-CM | POA: Diagnosis not present

## 2021-12-06 DIAGNOSIS — S88111A Complete traumatic amputation at level between knee and ankle, right lower leg, initial encounter: Secondary | ICD-10-CM

## 2021-12-06 DIAGNOSIS — B349 Viral infection, unspecified: Secondary | ICD-10-CM | POA: Diagnosis not present

## 2021-12-06 DIAGNOSIS — Z79899 Other long term (current) drug therapy: Secondary | ICD-10-CM | POA: Diagnosis not present

## 2021-12-06 DIAGNOSIS — R799 Abnormal finding of blood chemistry, unspecified: Secondary | ICD-10-CM | POA: Diagnosis not present

## 2021-12-06 DIAGNOSIS — Z89511 Acquired absence of right leg below knee: Secondary | ICD-10-CM

## 2021-12-06 DIAGNOSIS — T8610 Unspecified complication of kidney transplant: Secondary | ICD-10-CM | POA: Diagnosis not present

## 2021-12-06 DIAGNOSIS — T8619 Other complication of kidney transplant: Secondary | ICD-10-CM | POA: Diagnosis not present

## 2021-12-06 NOTE — Progress Notes (Signed)
Post-Op Visit Note   Patient: Raymond [name]           Date of Birth: 1961-02-05           MRN: [id number] Visit Date: 12/06/2021 PCP: [name], FNP  Chief Complaint:  Chief Complaint  Patient presents with   Right Leg - Routine Post Op    11/16/21 right BKA w/ kerecis   HPI:  HPI The patient is a 61 year old gentleman who presents status post right below-knee amputation with Kerecis placement June 24 he has been doing daily Dial soap cleansing dry dressings wearing his shrinker Ortho Exam On examination of the right residual limb staples are in place this is well-healed well consolidated there is no gaping drainage no erythema  Visit Diagnoses: No diagnosis found.  Plan: Continue daily Dial soap cleansing.  Continue shrinker staples harvested without incident.  Given an order for his prosthesis set up he will follow-up once more in 2 weeks    Follow-Up Instructions: Return in about 2 weeks (around 12/20/2021).   Imaging: No results found.  Orders:  No orders of the defined types were placed in this encounter.  No orders of the defined types were placed in this encounter.    PMFS History: Patient Active Problem List   Diagnosis Date Noted   Cutaneous abscess of right foot    Hyperkalemia 08/28/2021   Obesity (BMI 30-39.9) 08/28/2021   History of anemia due to chronic kidney disease 08/28/2021   Diabetic foot ulcer ([place]) 08/14/2021   Hypomagnesemia 06/25/2021   Septic arthritis of elbow, left ([place]) 06/24/2021   Peripheral vascular disease ([place]) 04/24/2020   Pyogenic inflammation of bone (HCC)    Thrombophlebitis of superficial veins of right lower extremity    Difficult intravenous access    MRSA bacteremia    Diabetic foot infection ([place])    Infected wound 01/12/2020   Critical lower limb ischemia ([place]) 02/02/2019   Personal history of noncompliance with medical treatment, presenting hazards to health 10/07/2018   Critical limb ischemia with  history of revascularization of same extremity ([place]) 07/27/2018   Mixed hyperlipidemia 02/19/2018   History of renal transplant 02/19/2018   Conductive hearing loss, bilateral 08/31/2017   Nuclear sclerotic cataract of right eye 10/07/2016   Pseudophakia of left eye 10/07/2016   Hypertension due to endocrine disorder 03/04/2016   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 ([place]) 03/04/2016   Renal failure 03/04/2016   Proliferative diabetic retinopathy with macular edema associated with type 2 diabetes mellitus ([place]) 03/04/2016   Immunosuppression ([place]) 12/10/2015   Nausea vomiting and diarrhea 12/10/2015   Type 2 diabetes mellitus ([place]) 12/10/2015   Essential hypertension, benign 12/10/2015   Pancytopenia ([place]) 12/10/2015   Encounter for screening colonoscopy 11/15/2014   GERD (gastroesophageal reflux disease) 11/15/2014   Past Medical History:  Diagnosis Date   Diabetes ([place])    Diabetes mellitus without complication ([place])    type 2   ESRD (end stage renal disease) ([place])    03/09/2015- patient had a kidney transplant   Hypertension    Peripheral vascular disease ([place])    Renal disorder    Renal insufficiency     Family History  Problem Relation Age of Onset   Diabetes Mother    Heart disease Father    Colon cancer Neg Hx     Past Surgical History:  Procedure Laterality Date   ABDOMINAL AORTOGRAM W/LOWER EXTREMITY N/A 09/11/2021  Procedure: ABDOMINAL AORTOGRAM W/LOWER EXTREMITY;  Surgeon: Wellington Hampshire, MD;  Location: [facility];  Service: Cardiovascular;  Laterality: N/A;   AMPUTATION Right 09/25/2021   Procedure: RIGHT TRANSMETATARSAL AMPUTATION AND APPLY TISSUE GRAFT;  Surgeon: Newt Minion, MD;  Location: [facility];  Service: Orthopedics;  Laterality: Right;   AMPUTATION Right 11/16/2021   Procedure: RIGHT AMPUTATION BELOW KNEE WITH WOUND VAC APPLICATION;  Surgeon: Newt Minion, MD;  Location: [facility];  Service: Orthopedics;  Laterality: Right;   BONE BIOPSY Right 03/06/2021   Procedure: BONE BIOPSY;  Surgeon: Trula Slade, DPM;  [facility];  Service: Podiatry;  Laterality: Right;   CENTRAL VENOUS CATHETER INSERTION Right 01/20/2020   Procedure: INSERTION CENTRAL LINE ADULT; Tunneled central line;  Surgeon: Virl Cagey, MD;  [facility];  Service: General;  Laterality: Right;   COLONOSCOPY N/A 12/05/2014   [id number]:[id number] external and internal hemorrhoid/mild diverticulosis/11 polyps removed   ESOPHAGOGASTRODUODENOSCOPY N/A 12/05/2014   IBB:CWUG duodenitis   GRAFT APPLICATION Right 89/16/9450   Procedure: GRAFT APPLICATION;  Surgeon: Trula Slade, DPM;  [facility];  Service: Podiatry;  Laterality: Right;   INCISION AND DRAINAGE OF WOUND Right 08/15/2021   Procedure: IRRIGATION AND DEBRIDEMENT WOUND;  Surgeon: Landis Martins, DPM;  [facility];  Service: Podiatry;  Laterality: Right;  need to make card not  a irrigation and debridement card   IR FLUORO GUIDE CV LINE RIGHT  03/01/2019   IR PERC TUN PERIT CATH WO PORT S&I /IMAG  06/27/2021   IR REMOVAL TUN CV CATH W/O FL  05/10/2019   IR REMOVAL TUN CV CATH W/O FL  02/29/2020   IR REMOVAL TUN CV CATH W/O FL  07/19/2021   IR US GUIDE VASC ACCESS RIGHT  03/01/2019   IR US GUIDE VASC ACCESS RIGHT  06/27/2021   IRRIGATION AND DEBRIDEMENT ELBOW Left 06/24/2021   Procedure: IRRIGATION AND DEBRIDEMENT ELBOW;  Surgeon: Lovell Sheehan, MD;  Location: [facility];  Service: Orthopedics;  Laterality: Left;   KIDNEY TRANSPLANT  03/27/2015   Penile Pump Insertion     PERIPHERAL VASCULAR ATHERECTOMY  09/11/2021   Procedure: PERIPHERAL VASCULAR ATHERECTOMY;  Surgeon: Wellington Hampshire, MD;  Location: [facility];  Service: Cardiovascular;;  Shockwave Lithotripsy   TEE WITHOUT CARDIOVERSION N/A 01/17/2020   Procedure: TRANSESOPHAGEAL ECHOCARDIOGRAM (TEE) WITH PROPOFOL;  Surgeon: Arnoldo Lenis, MD;  [facility];  Service: Endoscopy;  Laterality: N/A;   WOUND DEBRIDEMENT Right 03/06/2021   Procedure: DEBRIDEMENT WOUND;  Surgeon: Trula Slade, DPM;  [facility];  Service: Podiatry;  Laterality: Right;   Social History   Occupational History   Not on file  Tobacco Use   Smoking status: Never   Smokeless tobacco: Never  Vaping Use   Vaping Use: Never used  Substance and Sexual Activity   Alcohol use: No    Alcohol/week: 0.0 standard drinks of alcohol   Drug use: No   Sexual activity: Yes

## 2021-12-09 ENCOUNTER — Other Ambulatory Visit: Payer: 59

## 2021-12-13 DIAGNOSIS — Z940 Kidney transplant status: Secondary | ICD-10-CM | POA: Diagnosis not present

## 2021-12-16 ENCOUNTER — Other Ambulatory Visit: Payer: Self-pay

## 2021-12-16 MED ORDER — DEXCOM G7 SENSOR MISC: 3 refills | Status: DC

## 2021-12-16 MED ORDER — DEXCOM G7 RECEIVER DEVI: 1 refills | Status: DC

## 2021-12-17 ENCOUNTER — Encounter: Payer: Self-pay | Admitting: Family

## 2021-12-17 ENCOUNTER — Ambulatory Visit (INDEPENDENT_AMBULATORY_CARE_PROVIDER_SITE_OTHER): Payer: 59 | Admitting: Family

## 2021-12-17 DIAGNOSIS — Z89511 Acquired absence of right leg below knee: Secondary | ICD-10-CM

## 2021-12-17 NOTE — Progress Notes (Addendum)
Post-Op Visit Note   Patient: Raymond [name]           Date of Birth: 1960-06-18           MRN: [id number] Visit Date: 12/17/2021 PCP: [name], FNP  Chief Complaint:  Chief Complaint  Patient presents with   Right Leg - Routine Post Op    11/16/21 right BKA w/ kerecis    HPI:  HPI The patient is a 61 year old gentleman who presents status post right below-knee amputation June 24 he has been doing daily Dial soap cleansing wearing a shrinker he has an appointment to be casted for his prosthetic on August 3  Patient is a new right transtibial  amputee.  Patient's current comorbidities are not expected to impact the ability to function with the prescribed prosthesis. Patient verbally communicates a strong desire to use a prosthesis. Patient currently requires mobility aids to ambulate without a prosthesis.  Expects not to use mobility aids with a new prosthesis.  Patient is a K3 level ambulator that spends a lot of time walking around on uneven terrain over obstacles, up and down stairs, and ambulates with a variable cadence.     Ortho Exam On examination of the right residual limb this is consolidating well healing well as well he has 2 remaining open areas on either end of his incision with 5 mm in diameter ulceration covered with eschar he states he continues to have some bloody drainage laterally no erythema warmth or maceration  Visit Diagnoses: No diagnosis found.  Plan: Continue daily Dial soap cleansing.  Shrinker with direct skin contact.  Follow-up in the office in 3 months  Follow-Up Instructions: No follow-ups on file.   Imaging: No results found.  Orders:  No orders of the defined types were placed in this encounter.  No orders of the defined types were placed in this encounter.    PMFS History: Patient Active Problem List   Diagnosis Date Noted   Cutaneous abscess of right foot    Hyperkalemia 08/28/2021   Obesity (BMI 30-39.9)  08/28/2021   History of anemia due to chronic kidney disease 08/28/2021   Diabetic foot ulcer ([place]) 08/14/2021   Hypomagnesemia 06/25/2021   Septic arthritis of elbow, left ([place]) 06/24/2021   Peripheral vascular disease ([place]) 04/24/2020   Pyogenic inflammation of bone (HCC)    Thrombophlebitis of superficial veins of right lower extremity    Difficult intravenous access    MRSA bacteremia    Diabetic foot infection ([place])    Infected wound 01/12/2020   Critical lower limb ischemia ([place]) 02/02/2019   Personal history of noncompliance with medical treatment, presenting hazards to health 10/07/2018   Critical limb ischemia with history of revascularization of same extremity ([place]) 07/27/2018   Mixed hyperlipidemia 02/19/2018   History of renal transplant 02/19/2018   Conductive hearing loss, bilateral 08/31/2017   Nuclear sclerotic cataract of right eye 10/07/2016   Pseudophakia of left eye 10/07/2016   Hypertension due to endocrine disorder 03/04/2016   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 ([place]) 03/04/2016   Renal failure 03/04/2016   Proliferative diabetic retinopathy with macular edema associated with type 2 diabetes mellitus ([place]) 03/04/2016   Immunosuppression ([place]) 12/10/2015   Nausea vomiting and diarrhea 12/10/2015   Type 2 diabetes mellitus ([place]) 12/10/2015   Essential hypertension, benign 12/10/2015   Pancytopenia ([place]) 12/10/2015   Encounter for screening colonoscopy 11/15/2014   GERD (gastroesophageal reflux disease)  11/15/2014   Past Medical History:  Diagnosis Date   Diabetes ([place])    Diabetes mellitus without complication ([place])    type 2   ESRD (end stage renal disease) ([place])    03/09/2015- patient had a kidney transplant   Hypertension    Peripheral vascular disease ([place])    Renal disorder    Renal insufficiency     Family History  Problem Relation Age of Onset   Diabetes Mother    Heart  disease Father    Colon cancer Neg Hx     Past Surgical History:  Procedure Laterality Date   ABDOMINAL AORTOGRAM W/LOWER EXTREMITY N/A 09/11/2021   Procedure: ABDOMINAL AORTOGRAM W/LOWER EXTREMITY;  Surgeon: Wellington Hampshire, MD;  Location: [facility];  Service: Cardiovascular;  Laterality: N/A;   AMPUTATION Right 09/25/2021   Procedure: RIGHT TRANSMETATARSAL AMPUTATION AND APPLY TISSUE GRAFT;  Surgeon: Newt Minion, MD;  Location: [facility];  Service: Orthopedics;  Laterality: Right;   AMPUTATION Right 11/16/2021   Procedure: RIGHT AMPUTATION BELOW KNEE WITH WOUND VAC APPLICATION;  Surgeon: Newt Minion, MD;  Location: [facility];  Service: Orthopedics;  Laterality: Right;   BONE BIOPSY Right 03/06/2021   Procedure: BONE BIOPSY;  Surgeon: Trula Slade, DPM;  [facility];  Service: Podiatry;  Laterality: Right;   CENTRAL VENOUS CATHETER INSERTION Right 01/20/2020   Procedure: INSERTION CENTRAL LINE ADULT; Tunneled central line;  Surgeon: Virl Cagey, MD;  [facility];  Service: General;  Laterality: Right;   COLONOSCOPY N/A 12/05/2014   YNW:[id number] external and internal hemorrhoid/mild diverticulosis/11 polyps removed   ESOPHAGOGASTRODUODENOSCOPY N/A 12/05/2014   QMV:HQIO duodenitis   GRAFT APPLICATION Right 96/29/5284   Procedure: GRAFT APPLICATION;  Surgeon: Trula Slade, DPM;  [facility];  Service: Podiatry;  Laterality: Right;   INCISION AND DRAINAGE OF WOUND Right 08/15/2021   Procedure: IRRIGATION AND DEBRIDEMENT WOUND;  Surgeon: Landis Martins, DPM;  [facility];  Service: Podiatry;  Laterality: Right;  need to make card not  a irrigation and debridement card   IR FLUORO GUIDE CV LINE RIGHT  03/01/2019   IR PERC TUN PERIT CATH WO PORT S&I /IMAG  06/27/2021   IR REMOVAL TUN CV CATH W/O FL  05/10/2019   IR REMOVAL TUN CV CATH W/O FL  02/29/2020   IR REMOVAL TUN CV CATH W/O FL  07/19/2021   IR US GUIDE VASC ACCESS RIGHT  03/01/2019   IR US GUIDE  VASC ACCESS RIGHT  06/27/2021   IRRIGATION AND DEBRIDEMENT ELBOW Left 06/24/2021   Procedure: IRRIGATION AND DEBRIDEMENT ELBOW;  Surgeon: Lovell Sheehan, MD;  Location: [facility];  Service: Orthopedics;  Laterality: Left;   KIDNEY TRANSPLANT  03/27/2015   Penile Pump Insertion     PERIPHERAL VASCULAR ATHERECTOMY  09/11/2021   Procedure: PERIPHERAL VASCULAR ATHERECTOMY;  Surgeon: Wellington Hampshire, MD;  Location: [facility];  Service: Cardiovascular;;  Shockwave Lithotripsy   TEE WITHOUT CARDIOVERSION N/A 01/17/2020   Procedure: TRANSESOPHAGEAL ECHOCARDIOGRAM (TEE) WITH PROPOFOL;  Surgeon: Arnoldo Lenis, MD;  [facility];  Service: Endoscopy;  Laterality: N/A;   WOUND DEBRIDEMENT Right 03/06/2021   Procedure: DEBRIDEMENT WOUND;  Surgeon: Trula Slade, DPM;  [facility];  Service: Podiatry;  Laterality: Right;   Social History   Occupational History   Not on file  Tobacco Use   Smoking status: Never   Smokeless tobacco: Never  Vaping Use   Vaping Use: Never  used  Substance and Sexual Activity   Alcohol use: No    Alcohol/week: 0.0 standard drinks of alcohol   Drug use: No   Sexual activity: Yes

## 2021-12-19 DIAGNOSIS — E875 Hyperkalemia: Secondary | ICD-10-CM | POA: Diagnosis not present

## 2021-12-19 DIAGNOSIS — L02611 Cutaneous abscess of right foot: Secondary | ICD-10-CM | POA: Diagnosis not present

## 2021-12-19 DIAGNOSIS — E669 Obesity, unspecified: Secondary | ICD-10-CM | POA: Diagnosis not present

## 2021-12-19 DIAGNOSIS — N189 Chronic kidney disease, unspecified: Secondary | ICD-10-CM | POA: Diagnosis not present

## 2021-12-19 DIAGNOSIS — E11621 Type 2 diabetes mellitus with foot ulcer: Secondary | ICD-10-CM | POA: Diagnosis not present

## 2021-12-25 ENCOUNTER — Telehealth: Payer: Self-pay | Admitting: Orthopedic Surgery

## 2021-12-25 NOTE — Telephone Encounter (Signed)
Patient came in today to see if he can get a new protector for his amputated leg the orange piece that goes on his nub with the black straps, the black straps are not functional please advise

## 2021-12-25 NOTE — Telephone Encounter (Signed)
This pt is s/p a BKA on 11/16/2021 and states that his limb protector is no longer functional and was wanting a new one. Does he still have to wear it? Do you think [facility] would give him another one? What would you advise I tell the pt to do?

## 2021-12-26 NOTE — Telephone Encounter (Signed)
I called pt to advise and he will call with any other questions.

## 2022-01-07 ENCOUNTER — Ambulatory Visit: Payer: 59 | Admitting: Nurse Practitioner

## 2022-01-07 DIAGNOSIS — D631 Anemia in chronic kidney disease: Secondary | ICD-10-CM | POA: Diagnosis not present

## 2022-01-07 DIAGNOSIS — E119 Type 2 diabetes mellitus without complications: Secondary | ICD-10-CM | POA: Diagnosis not present

## 2022-01-07 DIAGNOSIS — Z940 Kidney transplant status: Secondary | ICD-10-CM | POA: Diagnosis not present

## 2022-01-07 DIAGNOSIS — N189 Chronic kidney disease, unspecified: Secondary | ICD-10-CM | POA: Diagnosis not present

## 2022-01-07 DIAGNOSIS — Z79899 Other long term (current) drug therapy: Secondary | ICD-10-CM | POA: Diagnosis not present

## 2022-01-07 DIAGNOSIS — I10 Essential (primary) hypertension: Secondary | ICD-10-CM | POA: Diagnosis not present

## 2022-01-07 DIAGNOSIS — N2581 Secondary hyperparathyroidism of renal origin: Secondary | ICD-10-CM | POA: Diagnosis not present

## 2022-01-07 DIAGNOSIS — E875 Hyperkalemia: Secondary | ICD-10-CM | POA: Diagnosis not present

## 2022-01-08 ENCOUNTER — Ambulatory Visit: Payer: 59 | Admitting: Cardiovascular Disease

## 2022-01-09 ENCOUNTER — Other Ambulatory Visit: Payer: Self-pay | Admitting: Endocrinology

## 2022-01-09 DIAGNOSIS — E1122 Type 2 diabetes mellitus with diabetic chronic kidney disease: Secondary | ICD-10-CM

## 2022-01-14 ENCOUNTER — Ambulatory Visit (INDEPENDENT_AMBULATORY_CARE_PROVIDER_SITE_OTHER): Payer: 59 | Admitting: Nurse Practitioner

## 2022-01-14 ENCOUNTER — Encounter: Payer: Self-pay | Admitting: Nurse Practitioner

## 2022-01-14 ENCOUNTER — Other Ambulatory Visit (INDEPENDENT_AMBULATORY_CARE_PROVIDER_SITE_OTHER): Payer: 59

## 2022-01-14 VITALS — BP 120/80 | HR 78 | Temp 97.8°F | Ht 67.0 in | Wt 214.0 lb

## 2022-01-14 DIAGNOSIS — N182 Chronic kidney disease, stage 2 (mild): Secondary | ICD-10-CM

## 2022-01-14 DIAGNOSIS — H6122 Impacted cerumen, left ear: Secondary | ICD-10-CM

## 2022-01-14 DIAGNOSIS — H9193 Unspecified hearing loss, bilateral: Secondary | ICD-10-CM | POA: Diagnosis not present

## 2022-01-14 DIAGNOSIS — Z89511 Acquired absence of right leg below knee: Secondary | ICD-10-CM

## 2022-01-14 DIAGNOSIS — H6123 Impacted cerumen, bilateral: Secondary | ICD-10-CM | POA: Diagnosis not present

## 2022-01-14 DIAGNOSIS — E1122 Type 2 diabetes mellitus with diabetic chronic kidney disease: Secondary | ICD-10-CM

## 2022-01-14 DIAGNOSIS — H6121 Impacted cerumen, right ear: Secondary | ICD-10-CM

## 2022-01-14 DIAGNOSIS — Z794 Long term (current) use of insulin: Secondary | ICD-10-CM

## 2022-01-14 LAB — BASIC METABOLIC PANEL
BUN: 31 mg/dL — ABNORMAL HIGH (ref 6–23)
CO2: 23 mEq/L (ref 19–32)
Calcium: 10.1 mg/dL (ref 8.4–10.5)
Chloride: 107 mEq/L (ref 96–112)
Creatinine, Ser: 1.31 mg/dL (ref 0.40–1.50)
GFR: 58.86 mL/min — ABNORMAL LOW (ref >60.00)
Glucose, Bld: 152 mg/dL — ABNORMAL HIGH (ref 70–99)
Potassium: 5.1 mEq/L (ref 3.5–5.1)
Sodium: 138 mEq/L (ref 135–145)

## 2022-01-14 NOTE — Patient Instructions (Signed)

## 2022-01-14 NOTE — Progress Notes (Signed)
Barnet [place] Martin,acting as a Education administrator for [name], FNP.,have documented all relevant documentation on the behalf of [name], FNP,as directed by  [name], FNP while in the presence of [name], [place].    Subjective:     Patient ID: Raymond [name] , male    DOB: Feb 26, 1961 , 61 y.o.   MRN: [id number]   Chief Complaint  Patient presents with   Cerumen Impaction    HPI  Patient presents today stating he needs his ear flushed. He states it is hard for him to hear and he took the hearing test at [facility] and failed it. He will keep the TV up loud. He also feels like he is talking loud. He put peroxide in his ear and not a drop of wax came out. Denies pain.   BP Readings from Last 3 Encounters: 01/14/22 : 120/80 12/02/21 : 130/74 11/28/21 : (!) 144/87       Past Medical History:  Diagnosis Date   Diabetes ([place])    Diabetes mellitus without complication ([place])    type 2   ESRD (end stage renal disease) ([place])    03/09/2015- patient had a kidney transplant   Hypertension    Peripheral vascular disease ([place])    Renal disorder    Renal insufficiency      Family History  Problem Relation Age of Onset   Diabetes Mother    Heart disease Father    Colon cancer Neg Hx      Current Outpatient Medications:    aspirin EC 81 MG tablet, Take 1 tablet (81 mg total) by mouth daily. Swallow whole., Disp: 150 tablet, Rfl: 0   atorvastatin (LIPITOR) 10 MG tablet, Take 10 mg by mouth daily., Disp: , Rfl:    blood glucose meter kit and supplies KIT, Dispense based on patient and insurance preference. Use up to four times daily as directed. (FOR ICD-9 250.00, 250.01)., Disp: 1 each, Rfl: 0   Blood Glucose Monitoring Suppl (ACCU-CHEK GUIDE ME) w/Device KIT, 1 Piece by Does not apply route as directed., Disp: 1 kit, Rfl: 0   brimonidine (ALPHAGAN) 0.2 % ophthalmic solution, Place 1 drop into both eyes 3 (three) times daily., Disp: , Rfl:    cloNIDine (CATAPRES - DOSED IN MG/24  HR) 0.3 mg/24hr patch, Place 1 patch (0.3 mg total) onto the skin once a week. (Patient taking differently: Place 0.3 mg onto the skin once a week. Place patch on Sunday), Disp: 4 patch, Rfl: 12   clopidogrel (PLAVIX) 75 MG tablet, Take 1 tablet (75 mg total) by mouth daily., Disp: 30 tablet, Rfl: 6   Continuous Blood Gluc Receiver ([place]) DEVI, Use to check blood sugar 3 times a day. Dx code e11.65, Disp: 1 each, Rfl: 1   Continuous Blood Gluc Sensor (DEXCOM G7 SENSOR) MISC, Use to check blood sugar 3 times a day. Dx code e11.65, Disp: 3 each, Rfl: 3   docusate sodium (COLACE) 100 MG capsule, Take 1 capsule (100 mg total) by mouth daily., Disp: 10 capsule, Rfl: 0   dorzolamide-timolol (COSOPT) 22.3-6.8 MG/ML ophthalmic solution, Place 1 drop into both eyes 2 (two) times daily., Disp: , Rfl:    doxazosin (CARDURA) 4 MG tablet, Take 4 mg by mouth daily., Disp: , Rfl:    Dulaglutide (TRULICITY) 2.97 LG/9.2JJ SOPN, Inject 0.75 mg into the skin once a week. (Patient taking differently: Inject 0.75 mg into the skin every Sunday.), Disp: 6 mL, Rfl: 3   gabapentin (NEURONTIN)  300 MG capsule, Take 3 capsules (900 mg total) by mouth 3 (three) times daily. 3 times a day, Disp: 270 capsule, Rfl: 3   glucose blood (ACCU-CHEK GUIDE) test strip, Use as instructed 4 x daily. E11.65 (Patient taking differently: 3 (three) times daily.), Disp: 150 each, Rfl: 5   hydrALAZINE (APRESOLINE) 25 MG tablet, Take 25 mg by mouth 2 (two) times daily., Disp: , Rfl:    Insulin NPH, Human,, Isophane, (NOVOLIN N FLEXPEN) 100 UNIT/ML Kiwkpen, Inject 60 Units into the skin in the morning and at bedtime., Disp: 15 mL, Rfl: 1   Insulin Regular Human (NOVOLIN R FLEXPEN RELION) 100 UNIT/ML KwikPen, Inject 1-9 Units as directed 3 (three) times daily before meals. Sliding scale CBG 70 - 120: 0 units CBG 121 - 150: 1 unit,  CBG 151 - 200: 2 units,  CBG 201 - 250: 3 units,  CBG 251 - 300: 5 units,  CBG 301 - 350: 7 units,  CBG 351 -  400: 9 units   CBG > 400: 9 units and notify your doctor, Disp: 15 mL, Rfl: 1   latanoprost (XALATAN) 0.005 % ophthalmic solution, Place 1 drop into both eyes at bedtime., Disp: , Rfl:    Multiple Vitamin (MULTIVITAMIN) capsule, Take 1 capsule by mouth daily., Disp: , Rfl:    mupirocin ointment (BACTROBAN) 2 %, Apply 1 application. topically 2 (two) times daily. For wound care, Disp: 30 g, Rfl: 0   ondansetron (ZOFRAN) 4 MG tablet, Take 1 tablet (4 mg total) by mouth every 6 (six) hours as needed for nausea., Disp: 20 tablet, Rfl: 0   oxyCODONE (OXY IR/ROXICODONE) 5 MG immediate release tablet, Take 1 tablet (5 mg total) by mouth every 4 (four) hours as needed for severe pain (pain score 4-6)., Disp: 45 tablet, Rfl: 0   oxyCODONE-acetaminophen (PERCOCET/ROXICET) 5-325 MG tablet, Take 1 tablet by mouth every 4 (four) hours as needed. (Patient taking differently: Take 1 tablet by mouth every 4 (four) hours as needed for moderate pain.), Disp: 30 tablet, Rfl: 0   sodium bicarbonate 650 MG tablet, Take 1,300 mg by mouth 3 (three) times daily., Disp: , Rfl:    sodium zirconium cyclosilicate (LOKELMA) 10 g PACK packet, Take 10 g by mouth daily., Disp: 30 packet, Rfl: 1   tacrolimus (PROGRAF) 1 MG capsule, Take 4 capsules (4 mg total) by mouth 2 (two) times daily., Disp: , Rfl:    Vitamin D, Ergocalciferol, (DRISDOL) 50000 units CAPS capsule, Take 50,000 Units by mouth See admin instructions. 15th of the month, Disp: , Rfl:   Current Facility-Administered Medications:    sodium chloride flush (NS) 0.9 % injection 3 mL, 3 mL, Intravenous, Q12H, Arida, Muhammad A, MD   Allergies  Allergen Reactions   Doxycycline     Runs up pt's blood sugar and phosphorus   Bactrim [Sulfamethoxazole-Trimethoprim]     Runs pt's sugar and phosphorus   Flagyl [Metronidazole] Rash    Patient having hives to either the vancomycin or flagyl (both infusing together).   Vancomycin Rash    Patient having hives to either  vancomycin or flagyl     Review of Systems  Constitutional: Negative.   HENT:  Positive for hearing loss.   Eyes: Negative.   Respiratory: Negative.    Cardiovascular: Negative.   Gastrointestinal: Negative.   Psychiatric/Behavioral: Negative.       Today's Vitals   01/14/22 1548  BP: 120/80  Pulse: 78  Temp: 97.8 F (36.6 C)  TempSrc: Oral  Weight: 214 lb (97.1 kg)  Height: '5\' 7"'  (1.702 m)  PainSc: 0-No pain   Body mass index is 33.52 kg/m.  Wt Readings from Last 3 Encounters:  01/14/22 214 lb (97.1 kg)  11/14/21 214 lb (97.1 kg)  09/25/21 217 lb (98.4 kg)     Objective:  Physical Exam Vitals reviewed.  Constitutional:      General: He is not in acute distress.    Appearance: Normal appearance. He is obese.  HENT:     Right Ear: External ear normal. There is impacted cerumen.     Left Ear: External ear normal. There is impacted cerumen.  Pulmonary:     Effort: Pulmonary effort is normal. No respiratory distress.  Musculoskeletal:        General: No swelling.     Comments: Right below knee amputation  Skin:    General: Skin is warm and dry.     Capillary Refill: Capillary refill takes less than 2 seconds.  Neurological:     General: No focal deficit present.     Mental Status: He is alert and oriented to person, place, and time.     Cranial Nerves: No cranial nerve deficit.     Motor: No weakness.  Psychiatric:        Mood and Affect: Mood normal.        Behavior: Behavior normal.        Thought Content: Thought content normal.        Judgment: Judgment normal.         Assessment And Plan:     1. Bilateral impacted cerumen Wax is removed by with lavage with elephant ear with 1/2 water and 1/2 peroxide. Instructions for home care to prevent wax buildup are given. Advised to avoid placing q-tips in ears.     Patient was given opportunity to ask questions. Patient verbalized understanding of the plan and was able to repeat key elements of the plan.  All questions were answered to their satisfaction.  [name], FNP   I, [name], FNP, have reviewed all documentation for this visit. The documentation on 01/14/22 for the exam, diagnosis, procedures, and orders are all accurate and complete.   IF YOU HAVE BEEN REFERRED TO A SPECIALIST, IT MAY TAKE 1-2 WEEKS TO SCHEDULE/PROCESS THE REFERRAL. IF YOU HAVE NOT HEARD FROM US/SPECIALIST IN TWO WEEKS, PLEASE GIVE [place] A CALL AT [phone].   THE PATIENT IS ENCOURAGED TO PRACTICE SOCIAL DISTANCING DUE TO THE COVID-19 PANDEMIC.

## 2022-01-15 DIAGNOSIS — Z940 Kidney transplant status: Secondary | ICD-10-CM | POA: Diagnosis not present

## 2022-01-15 LAB — FRUCTOSAMINE: Fructosamine: 327 umol/L — ABNORMAL HIGH (ref 0–285)

## 2022-01-17 ENCOUNTER — Encounter: Payer: Self-pay | Admitting: Endocrinology

## 2022-01-17 ENCOUNTER — Ambulatory Visit (INDEPENDENT_AMBULATORY_CARE_PROVIDER_SITE_OTHER): Payer: 59 | Admitting: Endocrinology

## 2022-01-17 VITALS — BP 126/44 | HR 68 | Ht 67.0 in | Wt 231.2 lb

## 2022-01-17 DIAGNOSIS — E1165 Type 2 diabetes mellitus with hyperglycemia: Secondary | ICD-10-CM | POA: Diagnosis not present

## 2022-01-17 DIAGNOSIS — Z89511 Acquired absence of right leg below knee: Secondary | ICD-10-CM | POA: Diagnosis not present

## 2022-01-17 DIAGNOSIS — N182 Chronic kidney disease, stage 2 (mild): Secondary | ICD-10-CM | POA: Diagnosis not present

## 2022-01-17 DIAGNOSIS — E1122 Type 2 diabetes mellitus with diabetic chronic kidney disease: Secondary | ICD-10-CM

## 2022-01-17 DIAGNOSIS — Z794 Long term (current) use of insulin: Secondary | ICD-10-CM | POA: Diagnosis not present

## 2022-01-17 LAB — MICROALBUMIN / CREATININE URINE RATIO
Creatinine,U: 104 mg/dL
Microalb Creat Ratio: 29.9 mg/g (ref 0.0–30.0)
Microalb, Ur: 31.1 mg/dL — ABNORMAL HIGH (ref 0.0–1.9)

## 2022-01-17 MED ORDER — TRULICITY 1.5 MG/0.5ML ~~LOC~~ SOAJ: SUBCUTANEOUS | 0 refills | Status: DC

## 2022-01-17 NOTE — Patient Instructions (Addendum)
Start taking 30 units of the NOVOLIN N insulin every morning regardless of blood sugar Reduce the bedtime dose of the Novolin to 45 units instead of 60 every night  NOVOLIN R insulin/fast-acting must be taken 15 to 30 minutes before every meal regardless of blood sugar  For small meals take 15 units and for average to large meals take 20 units before each meal regardless of blood sugar  If your blood sugar is still going up over 200 after a particular meal increase the dose for that meal by 5 units the next day  Trulicity will be increased to 1.5 mg for 4 weeks and then call for prescription for 3 mg weekly as long as he is not having nausea

## 2022-01-17 NOTE — Progress Notes (Unsigned)
Patient ID: Raymond [name], male   DOB: November 28, 1960, 61 y.o.   MRN: [id number]           Reason for Appointment: Type II Diabetes follow-up   History of Present Illness   Diagnosis date: 2002  Previous history:  Non-insulin hypoglycemic drugs previously used: Trulicity, glipizide He was started on Trulicity in 04/88 Insulin was started in 2018  A1c range in the last few years is: 10.4-14.8  Recent history:     Non-insulin hypoglycemic drugs: Trulicity 8.91 mg weekly     Insulin regimen: NPH 60 units hs once day usually, Novolin R only taken when blood sugars are over 250, 10-12 units    Side effects from medications: None  Current self management, blood sugar patterns and problems identified:  A1c is 11.7% and persistently high He has not been seen for endocrinology follow-up since 10/22 He started the Campbell sensor about 3 weeks ago from his PCP but did not download the clarity app and we were not able to install this or get his account linked in the office today on his cell phone He did not bring any written record of his blood sugars He has been prescribed NPH 60 units twice a day However he takes this only in the morning if his blood sugar is above normal and appears that his blood sugars are mostly close to normal most of the time and once had a low sugar of 65 He says he takes Novolin regular insulin only about 10 to 12 units when his blood sugars are over 250 but not otherwise He has been on the same dose of 6.94 mg Trulicity since this was started, has not had any side effects from this  Exercise: Unable to do any because of right BKA Diet management: He does try to have a bedtime snack, does not follow any specific meal plan     Hypoglycemia: Rarely in the morning on waking up  Glucometer: Previously using Accu-Chek          Blood Glucose readings from recall  PRE-MEAL Fasting Lunch Dinner Bedtime Overall  Glucose range: 65-123      Mean/median:         POST-MEAL PC Breakfast PC Lunch PC Dinner  Glucose range: 200 250-300 260  Mean/median:       Dietician visit: Most recent: Years ago      Weight control:  Wt Readings from Last 3 Encounters:  01/17/22 231 lb 3.2 oz (104.9 kg)  01/14/22 214 lb (97.1 kg)  11/14/21 214 lb (97.1 kg)            Diabetes labs:  Lab Results  Component Value Date   HGBA1C 11.7 (H) 11/17/2021   HGBA1C 10.4 (H) 08/14/2021   HGBA1C 10.6 (H) 06/24/2021   Lab Results  Component Value Date   MICROALBUR 31.1 (H) 01/17/2022   LDLCALC 46 08/23/2021   CREATININE 1.31 01/14/2022    Lab Results  Component Value Date   FRUCTOSAMINE 327 (H) 01/14/2022     Allergies as of 01/17/2022       Reactions   Doxycycline    Runs up pt's blood sugar and phosphorus   Bactrim [sulfamethoxazole-trimethoprim]    Runs pt's sugar and phosphorus   Flagyl [metronidazole] Rash   Patient having hives to either the vancomycin or flagyl (both infusing together).   Vancomycin Rash   Patient having hives to either vancomycin or flagyl        Medication List  Accurate as of January 17, 2022 11:59 PM. If you have any questions, ask your nurse or doctor.          STOP taking these medications    Trulicity 4.03 FV/4.3KG Sopn Generic drug: Dulaglutide Replaced by: Trulicity 1.5 OV/7.0HE Sopn Stopped by: Elayne Snare, MD       TAKE these medications    Accu-Chek Guide Me w/Device Kit 1 Piece by Does not apply route as directed.   aspirin EC 81 MG tablet Take 1 tablet (81 mg total) by mouth daily. Swallow whole.   atorvastatin 10 MG tablet Commonly known as: LIPITOR Take 10 mg by mouth daily.   blood glucose meter kit and supplies Kit Dispense based on patient and insurance preference. Use up to four times daily as directed. (FOR ICD-9 250.00, 250.01).   brimonidine 0.2 % ophthalmic solution Commonly known as: ALPHAGAN Place 1 drop into both eyes 3 (three) times daily.   cloNIDine 0.3  mg/24hr patch Commonly known as: CATAPRES - Dosed in mg/24 hr Place 1 patch (0.3 mg total) onto the skin once a week. What changed: additional instructions   clopidogrel 75 MG tablet Commonly known as: Plavix Take 1 tablet (75 mg total) by mouth daily.   Dexcom G7 Receiver Devi Use to check blood sugar 3 times a day. Dx code e11.65   Dexcom G7 Sensor Misc Use to check blood sugar 3 times a day. Dx code e11.65   docusate sodium 100 MG capsule Commonly known as: COLACE Take 1 capsule (100 mg total) by mouth daily.   dorzolamide-timolol 22.3-6.8 MG/ML ophthalmic solution Commonly known as: COSOPT Place 1 drop into both eyes 2 (two) times daily.   doxazosin 4 MG tablet Commonly known as: CARDURA Take 4 mg by mouth daily.   gabapentin 300 MG capsule Commonly known as: NEURONTIN Take 3 capsules (900 mg total) by mouth 3 (three) times daily. 3 times a day   glucose blood test strip Commonly known as: Accu-Chek Guide Use as instructed 4 x daily. E11.65 What changed:  when to take this additional instructions   hydrALAZINE 25 MG tablet Commonly known as: APRESOLINE Take 25 mg by mouth 2 (two) times daily.   latanoprost 0.005 % ophthalmic solution Commonly known as: XALATAN Place 1 drop into both eyes at bedtime.   Lokelma 10 g Pack packet Generic drug: sodium zirconium cyclosilicate Take 10 g by mouth daily.   multivitamin capsule Take 1 capsule by mouth daily.   mupirocin ointment 2 % Commonly known as: BACTROBAN Apply 1 application. topically 2 (two) times daily. For wound care   NovoLIN N FlexPen 100 UNIT/ML Kiwkpen Generic drug: Insulin NPH (Human) (Isophane) Inject 60 Units into the skin in the morning and at bedtime.   NovoLIN R FlexPen ReliOn 100 UNIT/ML KwikPen Generic drug: Insulin Regular Human Inject 1-9 Units as directed 3 (three) times daily before meals. Sliding scale CBG 70 - 120: 0 units CBG 121 - 150: 1 unit,  CBG 151 - 200: 2 units,  CBG 201 -  250: 3 units,  CBG 251 - 300: 5 units,  CBG 301 - 350: 7 units,  CBG 351 - 400: 9 units   CBG > 400: 9 units and notify your doctor   ondansetron 4 MG tablet Commonly known as: ZOFRAN Take 1 tablet (4 mg total) by mouth every 6 (six) hours as needed for nausea.   oxyCODONE 5 MG immediate release tablet Commonly known as: Oxy IR/ROXICODONE Take 1 tablet (5  mg total) by mouth every 4 (four) hours as needed for severe pain (pain score 4-6).   oxyCODONE-acetaminophen 5-325 MG tablet Commonly known as: PERCOCET/ROXICET Take 1 tablet by mouth every 4 (four) hours as needed.   sodium bicarbonate 650 MG tablet Take 1,300 mg by mouth 3 (three) times daily.   tacrolimus 1 MG capsule Commonly known as: PROGRAF Take 4 capsules (4 mg total) by mouth 2 (two) times daily.   Trulicity 1.5 MB/8.4YK Sopn Generic drug: Dulaglutide Inject weekly Replaces: Trulicity 5.99 JT/7.0VX Sopn Started by: Elayne Snare, MD   Vitamin D (Ergocalciferol) 1.25 MG (50000 UNIT) Caps capsule Commonly known as: DRISDOL Take 50,000 Units by mouth See admin instructions. 15th of the month        Allergies:  Allergies  Allergen Reactions   Doxycycline     Runs up pt's blood sugar and phosphorus   Bactrim [Sulfamethoxazole-Trimethoprim]     Runs pt's sugar and phosphorus   Flagyl [Metronidazole] Rash    Patient having hives to either the vancomycin or flagyl (both infusing together).   Vancomycin Rash    Patient having hives to either vancomycin or flagyl    Past Medical History:  Diagnosis Date   Diabetes ([place])    Diabetes mellitus without complication ([place])    type 2   ESRD (end stage renal disease) ([place])    03/09/2015- patient had a kidney transplant   Hypertension    Peripheral vascular disease ([place])    Renal disorder    Renal insufficiency     Past Surgical History:  Procedure Laterality Date   ABDOMINAL AORTOGRAM W/LOWER EXTREMITY N/A 09/11/2021   Procedure: ABDOMINAL AORTOGRAM W/LOWER  EXTREMITY;  Surgeon: Wellington Hampshire, MD;  Location: [facility];  Service: Cardiovascular;  Laterality: N/A;   AMPUTATION Right 09/25/2021   Procedure: RIGHT TRANSMETATARSAL AMPUTATION AND APPLY TISSUE GRAFT;  Surgeon: Newt Minion, MD;  Location: [facility];  Service: Orthopedics;  Laterality: Right;   AMPUTATION Right 11/16/2021   Procedure: RIGHT AMPUTATION BELOW KNEE WITH WOUND VAC APPLICATION;  Surgeon: Newt Minion, MD;  Location: [facility];  Service: Orthopedics;  Laterality: Right;   BONE BIOPSY Right 03/06/2021   Procedure: BONE BIOPSY;  Surgeon: Trula Slade, DPM;  Location: [facility];  Service: Podiatry;  Laterality: Right;   CENTRAL VENOUS CATHETER INSERTION Right 01/20/2020   Procedure: INSERTION CENTRAL LINE ADULT; Tunneled central line;  Surgeon: Virl Cagey, MD;  Location: [facility];  Service: General;  Laterality: Right;   COLONOSCOPY N/A 12/05/2014   BLT:[id number] external and internal hemorrhoid/mild diverticulosis/11 polyps removed   ESOPHAGOGASTRODUODENOSCOPY N/A 12/05/2014   RAQ:TMAU duodenitis   GRAFT APPLICATION Right 63/33/5456   Procedure: GRAFT APPLICATION;  Surgeon: Trula Slade, DPM;  Location: [facility];  Service: Podiatry;  Laterality: Right;   INCISION AND DRAINAGE OF WOUND Right 08/15/2021   Procedure: IRRIGATION AND DEBRIDEMENT WOUND;  Surgeon: Landis Martins, DPM;  Location: [facility];  Service: Podiatry;  Laterality: Right;  need to make card not  a irrigation and debridement card   IR FLUORO GUIDE CV LINE RIGHT  03/01/2019   IR PERC TUN PERIT CATH WO PORT S&I /IMAG  06/27/2021   IR REMOVAL TUN CV CATH W/O FL  05/10/2019   IR REMOVAL TUN CV CATH W/O FL  02/29/2020   IR REMOVAL TUN CV CATH W/O FL  07/19/2021   IR US GUIDE VASC ACCESS RIGHT  03/01/2019   IR US GUIDE VASC ACCESS RIGHT  06/27/2021  IRRIGATION AND DEBRIDEMENT ELBOW Left 06/24/2021   Procedure: IRRIGATION AND DEBRIDEMENT ELBOW;  Surgeon: Lovell Sheehan, MD;  Location: [facility];  Service:  Orthopedics;  Laterality: Left;   KIDNEY TRANSPLANT  03/27/2015   Penile Pump Insertion     PERIPHERAL VASCULAR ATHERECTOMY  09/11/2021   Procedure: PERIPHERAL VASCULAR ATHERECTOMY;  Surgeon: Wellington Hampshire, MD;  Location: [facility];  Service: Cardiovascular;;  Shockwave Lithotripsy   TEE WITHOUT CARDIOVERSION N/A 01/17/2020   Procedure: TRANSESOPHAGEAL ECHOCARDIOGRAM (TEE) WITH PROPOFOL;  Surgeon: Arnoldo Lenis, MD;  [facility];  Service: Endoscopy;  Laterality: N/A;   WOUND DEBRIDEMENT Right 03/06/2021   Procedure: DEBRIDEMENT WOUND;  Surgeon: Trula Slade, DPM;  Location: [facility];  Service: Podiatry;  Laterality: Right;    Family History  Problem Relation Age of Onset   Diabetes Mother    Heart disease Father    Colon cancer Neg Hx     Social History:  reports that he has never smoked. He has never used smokeless tobacco. He reports that he does not drink alcohol and does not use drugs.  Review of Systems:  Last diabetic eye exam date?  Last urine microalbumin date: 8/23  Last foot exam date: 10/22  Symptoms of neuropathy:  Hypertension: Managed by nephrology, not on ARB drug  BP Readings from Last 3 Encounters:  01/17/22 (!) 126/44  01/14/22 120/80  12/02/21 130/74    Lipid management: LDL appears to be controlled with 10 mg atorvastatin    Lab Results  Component Value Date   CHOL 104 08/23/2021   CHOL 148 12/11/2020   CHOL 183 12/12/2019   Lab Results  Component Value Date   HDL 38 (L) 08/23/2021   HDL 39 (L) 12/11/2020   HDL 41 12/12/2019   Lab Results  Component Value Date   LDLCALC 46 08/23/2021   LDLCALC 87 12/11/2020   LDLCALC 125 (H) 12/12/2019   Lab Results  Component Value Date   TRIG 111 08/23/2021   TRIG 121 12/11/2020   TRIG 94 12/12/2019   Lab Results  Component Value Date   CHOLHDL 2.7 08/23/2021   CHOLHDL 3.8 12/11/2020   CHOLHDL 4.5 12/12/2019   No results found for: "LDLDIRECT"   Fibrosis 4  Score = .69 (Low risk)        Interpretation for patients with NAFLD          <1.30       -  F0-F1 (Low risk)          1.30-2.67 -  Indeterminate           >2.67      -  F3-F4 (High risk)     Validated for ages 5-65      Examination:   BP (!) 126/44   Pulse 68   Ht $R'5\' 7"'IF$  (1.702 m)   Wt 231 lb 3.2 oz (104.9 kg)   SpO2 99%   BMI 36.21 kg/m   Body mass index is 36.21 kg/m.    ASSESSMENT/ PLAN:    Diabetes type 2 insulin-dependent:   Current regimen: NPH once a day at night and regular insulin as needed  See history of present illness for detailed discussion of current diabetes management, blood sugar patterns and problems identified  A1c is consistently high over 11 this year Fructosamine is 327 indicating significant hyperglycemia recently also  His insulin regimen is insufficiently and he has minimal understanding on how his insulin needs to be taken  to improve his blood sugar control He is only taking insulin when his blood sugars are high except for his bedtime dose of NPH With this he has significant hyperglycemia during the daytime with no basal or bolus insulin Also has been on the same starting dose of 0.81 mg Trulicity without titration and this was explained   He could also likely benefit significantly from diabetes education including meal planning Currently not able to review his Dexcom sensor data as it is not connected with the cloud through his phone This will be attempted later and he will likely need to make sure he is able to log into the clarity app which was downloaded today  His insulin doses need to be optimized as well as Trulicity progressively titrated to improve postprandial control and also facilitate some weight loss  Recommendations:  He will start taking 30 units of the NOVOLIN N insulin every morning regardless of blood sugar even if it is normal Reduce the bedtime dose of the Novolin to 45 units instead of 60 every night  NOVOLIN R  insulin/fast-acting must be taken 15 to 30 minutes before every meal regardless of blood sugar  For small meals he will take 15 units and for average to large meals take 20 units before each meal regardless of blood sugar  If your blood sugar is still going up over 200 after a particular meal increase the dose for that meal by 5 units the next day  Trulicity will be increased to 1.5 mg for 4 weeks and then call for prescription for 3 mg weekly as long as he is not having nausea  Follow-up to be scheduled for the diabetes educator to review his management and further help him with insulin adjustment in about 2 to 3 weeks  Need to get reports of his last eye exam and recommended annual exams  Patient Instructions  Start taking 30 units of the NOVOLIN N insulin every morning regardless of blood sugar Reduce the bedtime dose of the Novolin to 45 units instead of 60 every night  NOVOLIN R insulin/fast-acting must be taken 15 to 30 minutes before every meal regardless of blood sugar  For small meals take 15 units and for average to large meals take 20 units before each meal regardless of blood sugar  If your blood sugar is still going up over 200 after a particular meal increase the dose for that meal by 5 units the next day  Trulicity will be increased to 1.5 mg for 4 weeks and then call for prescription for 3 mg weekly as long as he is not having nausea    Elayne Snare 01/18/2022, 9:23 PM

## 2022-01-19 DIAGNOSIS — E669 Obesity, unspecified: Secondary | ICD-10-CM | POA: Diagnosis not present

## 2022-01-19 DIAGNOSIS — E875 Hyperkalemia: Secondary | ICD-10-CM | POA: Diagnosis not present

## 2022-01-19 DIAGNOSIS — E11621 Type 2 diabetes mellitus with foot ulcer: Secondary | ICD-10-CM | POA: Diagnosis not present

## 2022-01-19 DIAGNOSIS — L02611 Cutaneous abscess of right foot: Secondary | ICD-10-CM | POA: Diagnosis not present

## 2022-01-19 DIAGNOSIS — N189 Chronic kidney disease, unspecified: Secondary | ICD-10-CM | POA: Diagnosis not present

## 2022-01-23 ENCOUNTER — Telehealth: Payer: Self-pay | Admitting: Endocrinology

## 2022-01-23 DIAGNOSIS — E1165 Type 2 diabetes mellitus with hyperglycemia: Secondary | ICD-10-CM

## 2022-01-23 MED ORDER — NOVOLIN N FLEXPEN 100 UNIT/ML ~~LOC~~ SUPN: PEN_INJECTOR | SUBCUTANEOUS | 1 refills | Status: DC

## 2022-01-23 NOTE — Telephone Encounter (Signed)
Called left voicemail for patient

## 2022-01-23 NOTE — Telephone Encounter (Signed)
Novolin N has been sent. Patient is not on glipizide per Dr Dwyane Dee

## 2022-01-23 NOTE — Addendum Note (Signed)
Addended by: Cinda Quest on: 01/23/2022 04:25 PM   Modules accepted: Orders

## 2022-01-23 NOTE — Telephone Encounter (Signed)
MEDICATION: NovoLIN N FlexPen Insulin NPH, Human,, Isophane, (NOVOLIN N FLEXPEN) 100 UNIT/ML Kiwkpen AND Glipizide Er Tabs (generic For Glucotrol XI )  PHARMACY:  [facility], [place] [address] (Ph: [phone])  HAS THE PATIENT CONTACTED THEIR PHARMACY?  yes  IS THIS A 90 DAY SUPPLY : yes  IS PATIENT OUT OF MEDICATION: yes  IF NOT; HOW MUCH IS LEFT:   LAST APPOINTMENT DATE: '@8'$ /25/2023  NEXT APPOINTMENT DATE:'@12'$ /15/2023 ([facility]) & 05/13/2022 (Dr. Dwyane Dee)  DO WE HAVE YOUR PERMISSION TO LEAVE A DETAILED MESSAGE?:Yes  OTHER COMMENTS:    **Let patient know to contact pharmacy at the end of the day to make sure medication is ready. **  ** Please notify patient to allow 48-72 hours to process**  **Encourage patient to contact the pharmacy for refills or they can request refills through [facility]**

## 2022-01-24 ENCOUNTER — Telehealth: Payer: Self-pay | Admitting: Family

## 2022-01-24 NOTE — Telephone Encounter (Signed)
Mindy from [facility] called requesting a call back concerning pt. Try to find if pt is a good candidate for prosthesis. Please call Mindy at [phone].

## 2022-01-26 ENCOUNTER — Encounter (HOSPITAL_COMMUNITY): Payer: Self-pay

## 2022-01-26 ENCOUNTER — Other Ambulatory Visit: Payer: Self-pay

## 2022-01-26 ENCOUNTER — Emergency Department (HOSPITAL_COMMUNITY)
Admission: EM | Admit: 2022-01-26 | Discharge: 2022-01-26 | Disposition: A | Discharge status: Home / Self Care | Payer: 59 | Attending: Emergency Medicine | Admitting: Emergency Medicine

## 2022-01-26 DIAGNOSIS — M7989 Other specified soft tissue disorders: Secondary | ICD-10-CM | POA: Diagnosis not present

## 2022-01-26 DIAGNOSIS — R2240 Localized swelling, mass and lump, unspecified lower limb: Secondary | ICD-10-CM | POA: Diagnosis not present

## 2022-01-26 DIAGNOSIS — I10 Essential (primary) hypertension: Secondary | ICD-10-CM | POA: Diagnosis not present

## 2022-01-26 DIAGNOSIS — Z794 Long term (current) use of insulin: Secondary | ICD-10-CM | POA: Diagnosis not present

## 2022-01-26 DIAGNOSIS — E119 Type 2 diabetes mellitus without complications: Secondary | ICD-10-CM | POA: Diagnosis not present

## 2022-01-26 DIAGNOSIS — Z7982 Long term (current) use of aspirin: Secondary | ICD-10-CM | POA: Insufficient documentation

## 2022-01-26 DIAGNOSIS — Z7901 Long term (current) use of anticoagulants: Secondary | ICD-10-CM | POA: Diagnosis not present

## 2022-01-26 NOTE — ED Triage Notes (Signed)
Patient states his PCP told him to come here for imaging with hx of kidney transplant in 2016 with swelling in left foot and leg. Denies pain

## 2022-01-26 NOTE — ED Notes (Signed)
BP 230/93--MD made aware

## 2022-01-26 NOTE — Discharge Instructions (Signed)
Elevate legs when possible.  Continue to follow-up with your primary care doctor for ongoing management of your chronic conditions.  Be sure to take your home medications as prescribed and replace your clonidine patch as soon as possible.  Return to the emergency department at any time for any new or worsening symptoms of concern.

## 2022-01-26 NOTE — ED Provider Notes (Addendum)
[facility] EMERGENCY DEPARTMENT Provider Note   CSN: [id number] Arrival date & time: 01/26/22  3818     History  Chief Complaint  Patient presents with   Leg Swelling    Raymond [name] is a 61 y.o. male.  HPI Patient presents for concern of left leg swelling.  Medical history includes T2DM, HTN, HLD, ESRD s/p renal transplant in 2016, GERD, PVD, anemia.  He was recently seen by his primary care doctor.  He was told to come to the ED for an imaging study.  He is not sure what imaging study his primary care doctor had in mind.  Patient reports that he has had swelling in his distal left lower extremity that is worse at the end of the day after prolonged periods of being upright and improves with leg elevation.  Swelling has been nonpainful.  He has not had any wounds to his LLE.  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  He has had swelling to the area of his stump that he noticed when trying to fit into his prosthesis.  This swelling to has been nonpainful.  Areas have been nonerythematous.  Patient has not had any swelling to his lower extremities today.  Patient currently denies any symptoms.  His recent lab work-up with PCP was reassuring.    Home Medications Prior to Admission medications   Medication Sig Start Date End Date Taking? Authorizing Provider  aspirin EC 81 MG tablet Take 1 tablet (81 mg total) by mouth daily. Swallow whole. 09/11/21 09/11/22  Wellington Hampshire, MD  atorvastatin (LIPITOR) 10 MG tablet Take 10 mg by mouth daily.    [provider]  blood glucose meter kit and supplies KIT Dispense based on patient and insurance preference. Use up to four times daily as directed. (FOR ICD-9 250.00, 250.01). 04/11/20   [name], MD  Blood Glucose Monitoring Suppl (ACCU-CHEK GUIDE ME) w/Device KIT 1 Piece by Does not apply route as directed. 02/18/18   [name], MD  brimonidine (ALPHAGAN) 0.2 % ophthalmic solution Place 1 drop into both eyes 3  (three) times daily. 07/25/21   [provider]  cloNIDine (CATAPRES - DOSED IN MG/24 HR) 0.3 mg/24hr patch Place 1 patch (0.3 mg total) onto the skin once a week. Patient taking differently: Place 0.3 mg onto the skin once a week. Place patch on Sunday 08/21/21   Lorretta Harp, MD  clopidogrel (PLAVIX) 75 MG tablet Take 1 tablet (75 mg total) by mouth daily. 09/11/21 09/11/22  Wellington Hampshire, MD  Continuous Blood Gluc Receiver (DEXCOM G7 RECEIVER) DEVI Use to check blood sugar 3 times a day. Dx code e11.65 12/16/21   [name], FNP  Continuous Blood Gluc Sensor (DEXCOM G7 SENSOR) MISC Use to check blood sugar 3 times a day. Dx code e11.65 12/16/21   [name], FNP  docusate sodium (COLACE) 100 MG capsule Take 1 capsule (100 mg total) by mouth daily. 11/20/21   Pokhrel, Corrie Mckusick, MD  dorzolamide-timolol (COSOPT) 22.3-6.8 MG/ML ophthalmic solution Place 1 drop into both eyes 2 (two) times daily. 07/25/21   [provider]  doxazosin (CARDURA) 4 MG tablet Take 4 mg by mouth daily. 12/18/15   [provider]  Dulaglutide (TRULICITY) 1.5 EX/9.3ZJ SOPN Inject weekly 01/17/22   Elayne Snare, MD  gabapentin (NEURONTIN) 300 MG capsule Take 3 capsules (900 mg total) by mouth 3 (three) times daily. 3 times a day 11/22/21   Suzan Slick, NP  glucose blood (ACCU-CHEK GUIDE) test strip Use as instructed 4 x daily. E11.65 Patient taking differently: 3 (three) times daily. 02/19/18   [name], MD  hydrALAZINE (APRESOLINE) 25 MG tablet Take 25 mg by mouth 2 (two) times daily.    [provider]  Insulin NPH, Human,, Isophane, (NOVOLIN N FLEXPEN) 100 UNIT/ML Kiwkpen Inject 30 units in the morning and 45 units at bedtime 01/23/22   Elayne Snare, MD  Insulin Regular Human (NOVOLIN R FLEXPEN RELION) 100 UNIT/ML KwikPen Inject 1-9 Units as directed 3 (three) times daily before meals. Sliding scale CBG 70 - 120: 0 units CBG 121 - 150: 1 unit,  CBG 151 - 200: 2 units,  CBG 201  - 250: 3 units,  CBG 251 - 300: 5 units,  CBG 301 - 350: 7 units,  CBG 351 - 400: 9 units   CBG > 400: 9 units and notify your doctor 11/14/21   [name], FNP  latanoprost (XALATAN) 0.005 % ophthalmic solution Place 1 drop into both eyes at bedtime. 07/25/21   [provider]  Multiple Vitamin (MULTIVITAMIN) capsule Take 1 capsule by mouth daily.    [provider]  mupirocin ointment (BACTROBAN) 2 % Apply 1 application. topically 2 (two) times daily. For wound care Patient not taking: Reported on 01/17/2022 09/12/21   Landis Martins, DPM  ondansetron (ZOFRAN) 4 MG tablet Take 1 tablet (4 mg total) by mouth every 6 (six) hours as needed for nausea. Patient not taking: Reported on 01/17/2022 11/19/21   Pokhrel, Corrie Mckusick, MD  oxyCODONE (OXY IR/ROXICODONE) 5 MG immediate release tablet Take 1 tablet (5 mg total) by mouth every 4 (four) hours as needed for severe pain (pain score 4-6). Patient not taking: Reported on 01/17/2022 11/25/21   Newt Minion, MD  oxyCODONE-acetaminophen (PERCOCET/ROXICET) 5-325 MG tablet Take 1 tablet by mouth every 4 (four) hours as needed. Patient not taking: Reported on 01/17/2022 09/25/21   Newt Minion, MD  sodium bicarbonate 650 MG tablet Take 1,300 mg by mouth 3 (three) times daily. 11/02/18   [provider]  sodium zirconium cyclosilicate (LOKELMA) 10 g PACK packet Take 10 g by mouth daily. 08/30/21   Rai, Vernelle Emerald, MD  tacrolimus (PROGRAF) 1 MG capsule Take 4 capsules (4 mg total) by mouth 2 (two) times daily. 08/30/21   Rai, Vernelle Emerald, MD  Vitamin D, Ergocalciferol, (DRISDOL) 50000 units CAPS capsule Take 50,000 Units by mouth See admin instructions. 15th of the month    [provider]      Allergies    Doxycycline, Bactrim [sulfamethoxazole-trimethoprim], Flagyl [metronidazole], and Vancomycin    Review of Systems   Review of Systems  Cardiovascular:  Positive for leg swelling.  All other systems reviewed and are  negative.   Physical Exam Updated Vital Signs BP (!) 216/104   Pulse 78   Temp 97.6 F (36.4 C) (Oral)   Resp 18   Ht _0  (1.702 m)   Wt 97.7 kg   SpO2 100%   BMI 33.74 kg/m  Physical Exam Vitals and nursing note reviewed.  Constitutional:      General: He is not in acute distress.    Appearance: Normal appearance. He is well-developed. He is not ill-appearing, toxic-appearing or diaphoretic.  HENT:     Head: Normocephalic and atraumatic.     Right Ear: External ear normal.     Left Ear: External ear normal.     Nose: Nose normal.  Mouth/Throat:     Mouth: Mucous membranes are moist.     Pharynx: Oropharynx is clear.  Eyes:     Extraocular Movements: Extraocular movements intact.     Conjunctiva/sclera: Conjunctivae normal.  Cardiovascular:     Rate and Rhythm: Normal rate and regular rhythm.     Heart sounds: No murmur heard. Pulmonary:     Effort: Pulmonary effort is normal. No respiratory distress.  Abdominal:     Palpations: Abdomen is soft.     Tenderness: There is no abdominal tenderness.  Musculoskeletal:        General: No swelling. Normal range of motion.     Cervical back: Normal range of motion and neck supple.     Right lower leg: No edema.     Left lower leg: No edema.     Comments: Right BKA  Skin:    General: Skin is warm and dry.     Capillary Refill: Capillary refill takes less than 2 seconds.     Coloration: Skin is not jaundiced or pale.  Neurological:     General: No focal deficit present.     Mental Status: He is alert and oriented to person, place, and time.  Psychiatric:        Mood and Affect: Mood normal.        Behavior: Behavior normal.        Thought Content: Thought content normal.        Judgment: Judgment normal.     ED Results / Procedures / Treatments   Labs (all labs ordered are listed, but only abnormal results are displayed) Labs Reviewed - No data to display  EKG None  Radiology No results  found.  Procedures Procedures    Medications Ordered in ED Medications - No data to display  ED Course/ Medical Decision Making/ A&P                           Medical Decision Making  Patient presents to the ED at the request of his PCP for recent leg swelling.  Patient describes his swelling as located in his distal LLE.  When it is present, he does describe pitting edema.  Swelling is worsened with prolonged periods of being upright and improved with leg elevation.  Swelling has been nonpainful.  Skin is been nonerythematous.  He has not had any recent wounds.  He has had swelling in his right lower extremity stump as well that he noticed when he was unable to fit into his recently manufactured prosthesis.  On arrival in the ED, patient's vital signs are notable for hypertension.  He is well-appearing on exam.  There is currently no evidence of lower extremity swelling.  Patient denies any other recent symptoms.  Given absence of any pain, wounds, erythema, or swelling that does not improve with leg elevation, I do not feel the patient requires any imaging at this time.  He has recently had lab work through his PCP which he says was reassuring.  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  I did speak to him about his blood pressure.  Patient typically wears a clonidine patch.  When he showered yesterday, he removed the patch and has not replaced it.  I suspect his elevated blood pressure is secondary to clonidine rebound hypertension.  Patient was offered a clonidine patch here in the ED but he states that he would prefer to go home  and place his patch.  Patient lives nearby and will likely receive his patch sooner if he does go home.  For this reason, I feel that this is reasonable.  Patient declines any further work-up in the ED and, given his absence of symptoms, I feel that this is also reasonable.  Patient was encouraged to return to the ED at any  time for any further symptoms of concern.  He was discharged in stable condition.        Final Clinical Impression(s) / ED Diagnoses Final diagnoses:  Leg swelling    Rx / DC Orders ED Discharge Orders     None         Godfrey Pick, MD 01/26/22 1014    Godfrey Pick, MD 01/26/22 1019

## 2022-01-29 ENCOUNTER — Telehealth: Payer: Self-pay

## 2022-01-29 NOTE — Telephone Encounter (Signed)
Transition Care Management Follow-up Telephone Call Date of discharge and from where: 01/26/2021 How have you been since you were released from the hospital? Pt states he feels good.  Any questions or concerns? No  Items Reviewed: Did the pt receive and understand the discharge instructions provided? Yes  Medications obtained and verified? Yes  Other? No  Any new allergies since your discharge? No  Dietary orders reviewed? Yes Do you have support at home? Yes   Home Care and Equipment/Supplies: Were home health services ordered? No  If so, what is the name of the agency? N/a  Has the agency set up a time to come to the patient's home? no Were any new equipment or medical supplies ordered?  No What is the name of the medical supply agency? N/a Were you able to get the supplies/equipment? no Do you have any questions related to the use of the equipment or supplies? No  Functional Questionnaire: (I = Independent and D = Dependent) ADLs: i  Bathing/Dressing- i  Meal Prep- i  Eating- i  Maintaining continence- i  Transferring/Ambulation- i  Managing Meds- i  Follow up appointments reviewed:  PCP Hospital f/u appt confirmed? Yes  Scheduled to see [name]  on 01/30/2022 @ [facility]. [facility] f/u appt confirmed? No  Scheduled to see b/a on n/a @ n/a. Are transportation arrangements needed? No  If their condition worsens, is the pt aware to call PCP or go to the Emergency Dept.? Yes Was the patient provided with contact information for the PCP's office or ED? Yes Was to pt encouraged to call back with questions or concerns? Yes

## 2022-01-29 NOTE — Telephone Encounter (Signed)
LMTCB

## 2022-01-30 ENCOUNTER — Ambulatory Visit: Payer: 59 | Admitting: Nurse Practitioner

## 2022-01-31 NOTE — Telephone Encounter (Signed)
lmtcb

## 2022-01-31 NOTE — Telephone Encounter (Signed)
Got a message through TEAMS that was sent on Wednesday at 4:30 pm that [facility] returned my call. I left the office at 3pm that day and was out of the office on Thursday. I will return their call.

## 2022-02-04 NOTE — Telephone Encounter (Signed)
Have not heard back.

## 2022-02-05 ENCOUNTER — Encounter: Payer: Self-pay | Admitting: Nurse Practitioner

## 2022-02-05 ENCOUNTER — Other Ambulatory Visit: Payer: Self-pay | Admitting: Endocrinology

## 2022-02-05 ENCOUNTER — Ambulatory Visit (INDEPENDENT_AMBULATORY_CARE_PROVIDER_SITE_OTHER): Payer: 59 | Admitting: Nurse Practitioner

## 2022-02-05 VITALS — BP 200/100 | HR 71 | Temp 98.4°F | Ht 67.0 in | Wt 215.0 lb

## 2022-02-05 DIAGNOSIS — Z794 Long term (current) use of insulin: Secondary | ICD-10-CM

## 2022-02-05 DIAGNOSIS — E1122 Type 2 diabetes mellitus with diabetic chronic kidney disease: Secondary | ICD-10-CM

## 2022-02-05 DIAGNOSIS — R140 Abdominal distension (gaseous): Secondary | ICD-10-CM

## 2022-02-05 DIAGNOSIS — Z23 Encounter for immunization: Secondary | ICD-10-CM | POA: Diagnosis not present

## 2022-02-05 DIAGNOSIS — N182 Chronic kidney disease, stage 2 (mild): Secondary | ICD-10-CM | POA: Diagnosis not present

## 2022-02-05 DIAGNOSIS — I129 Hypertensive chronic kidney disease with stage 1 through stage 4 chronic kidney disease, or unspecified chronic kidney disease: Secondary | ICD-10-CM

## 2022-02-05 MED ORDER — HYDRALAZINE HCL 25 MG PO TABS: 25.0000 mg | ORAL_TABLET | Freq: Three times a day (TID) | ORAL | 1 refills | Status: DC

## 2022-02-05 NOTE — Patient Instructions (Signed)
Hypertension, Adult ?Hypertension is another name for high blood pressure. High blood pressure forces your heart to work harder to pump blood. This can cause problems over time. ?There are two numbers in a blood pressure reading. There is a top number (systolic) over a bottom number (diastolic). It is best to have a blood pressure that is below 120/80. ?What are the causes? ?The cause of this condition is not known. Some other conditions can lead to high blood pressure. ?What increases the risk? ?Some lifestyle factors can make you more likely to develop high blood pressure: ?Smoking. ?Not getting enough exercise or physical activity. ?Being overweight. ?Having too much fat, sugar, calories, or salt (sodium) in your diet. ?Drinking too much alcohol. ?Other risk factors include: ?Having any of these conditions: ?Heart disease. ?Diabetes. ?High cholesterol. ?Kidney disease. ?Obstructive sleep apnea. ?Having a family history of high blood pressure and high cholesterol. ?Age. The risk increases with age. ?Stress. ?What are the signs or symptoms? ?High blood pressure may not cause symptoms. Very high blood pressure (hypertensive crisis) may cause: ?Headache. ?Fast or uneven heartbeats (palpitations). ?Shortness of breath. ?Nosebleed. ?Vomiting or feeling like you may vomit (nauseous). ?Changes in how you see. ?Very bad chest pain. ?Feeling dizzy. ?Seizures. ?How is this treated? ?This condition is treated by making healthy lifestyle changes, such as: ?Eating healthy foods. ?Exercising more. ?Drinking less alcohol. ?Your doctor may prescribe medicine if lifestyle changes do not help enough and if: ?Your top number is above 130. ?Your bottom number is above 80. ?Your personal target blood pressure may vary. ?Follow these instructions at home: ?Eating and drinking ? ?If told, follow the DASH eating plan. To follow this plan: ?Fill one half of your plate at each meal with fruits and vegetables. ?Fill one fourth of your plate  at each meal with whole grains. Whole grains include whole-wheat pasta, brown rice, and whole-grain bread. ?Eat or drink low-fat dairy products, such as skim milk or low-fat yogurt. ?Fill one fourth of your plate at each meal with low-fat (lean) proteins. Low-fat proteins include fish, chicken without skin, eggs, beans, and tofu. ?Avoid fatty meat, cured and processed meat, or chicken with skin. ?Avoid pre-made or processed food. ?Limit the amount of salt in your diet to less than 1,500 mg each day. ?Do not drink alcohol if: ?Your doctor tells you not to drink. ?You are pregnant, may be pregnant, or are planning to become pregnant. ?If you drink alcohol: ?Limit how much you have to: ?0-1 drink a day for women. ?0-2 drinks a day for men. ?Know how much alcohol is in your drink. In the [place] one drink equals one 12 oz bottle of beer (355 mL), one 5 oz glass of wine (148 mL), or one 1? oz glass of hard liquor (44 mL). ?Lifestyle ? ?Work with your doctor to stay at a healthy weight or to lose weight. Ask your doctor what the best weight is for you. ?Get at least 30 minutes of exercise that causes your heart to beat faster (aerobic exercise) most days of the week. This may include walking, swimming, or biking. ?Get at least 30 minutes of exercise that strengthens your muscles (resistance exercise) at least 3 days a week. This may include lifting weights or doing Pilates. ?Do not smoke or use any products that contain nicotine or tobacco. If you need help quitting, ask your doctor. ?Check your blood pressure at home as told by your doctor. ?Keep all follow-up visits. ?Medicines ?Take over-the-counter and prescription medicines   only as told by your doctor. Follow directions carefully. ?Do not skip doses of blood pressure medicine. The medicine does not work as well if you skip doses. Skipping doses also puts you at risk for problems. ?Ask your doctor about side effects or reactions to medicines that you should watch  for. ?Contact a doctor if: ?You think you are having a reaction to the medicine you are taking. ?You have headaches that keep coming back. ?You feel dizzy. ?You have swelling in your ankles. ?You have trouble with your vision. ?Get help right away if: ?You get a very bad headache. ?You start to feel mixed up (confused). ?You feel weak or numb. ?You feel faint. ?You have very bad pain in your: ?Chest. ?Belly (abdomen). ?You vomit more than once. ?You have trouble breathing. ?These symptoms may be an emergency. Get help right away. Call 911. ?Do not wait to see if the symptoms will go away. ?Do not drive yourself to the hospital. ?Summary ?Hypertension is another name for high blood pressure. ?High blood pressure forces your heart to work harder to pump blood. ?For most people, a normal blood pressure is less than 120/80. ?Making healthy choices can help lower blood pressure. If your blood pressure does not get lower with healthy choices, you may need to take medicine. ?This information is not intended to replace advice given to you by your health care provider. Make sure you discuss any questions you have with your health care provider. ?Document Revised: 02/28/2021 Document Reviewed: 02/28/2021 ?Elsevier Patient Education ? 2023 [employer] ? ?

## 2022-02-05 NOTE — Progress Notes (Signed)
Barnet [place] Martin,acting as a Education administrator for [name], FNP.,have documented all relevant documentation on the behalf of [name], FNP,as directed by  [name], FNP while in the presence of [name], [place].    Subjective:     Patient ID: Raymond [name] , male    DOB: 06-16-1960 , 61 y.o.   MRN: [id number]   Chief Complaint  Patient presents with   Diabetes   Hypertension    HPI  Patient presents today for a BP and DM check. He has taken his medications today at 4am, continues to take hydralazine 25 mg two times a day. He is also on a clonidine patch. He has been eating beans for 2 days, sausage and eggs and protein shake. She seasoned with a ham bone. He has cut back on fried foods. He has cut out his beer intake and drinking more water.   Patient states he wants to talk about being bloated, he states its been about 2 weeks. He states he is taking on water after 2 bottles of water he is full and he doesn't think he is urinating enough. He was not urinating regularly so his transplant provider advised him to go to ER.   BP Readings from Last 3 Encounters: 02/05/22 : (!) 200/100 01/26/22 : (!) 230/93 01/17/22 : (!) 126/44    Diabetes He presents for his follow-up diabetic visit. He has type 2 diabetes mellitus. There are no hypoglycemic associated symptoms. There are no diabetic associated symptoms. There are no hypoglycemic complications. There are no diabetic complications. Risk factors for coronary artery disease include obesity, sedentary lifestyle, male sex, hypertension, dyslipidemia and diabetes mellitus (Wheelchair bound). Current diabetic treatment includes oral agent (dual therapy). (Dexcom readings range - 61-223 today  He is giving as needed insulin 3-4 times a week.)  Hypertension     Past Medical History:  Diagnosis Date   Diabetes ([place])    Diabetes mellitus without complication ([place])    type 2   ESRD (end stage renal disease) ([place])    03/09/2015-  patient had a kidney transplant   Hypertension    Peripheral vascular disease ([place])    Renal disorder    Renal insufficiency      Family History  Problem Relation Age of Onset   Diabetes Mother    Heart disease Father    Colon cancer Neg Hx      Current Outpatient Medications:    aspirin EC 81 MG tablet, Take 1 tablet (81 mg total) by mouth daily. Swallow whole., Disp: 150 tablet, Rfl: 0   blood glucose meter kit and supplies KIT, Dispense based on patient and insurance preference. Use up to four times daily as directed. (FOR ICD-9 250.00, 250.01)., Disp: 1 each, Rfl: 0   Blood Glucose Monitoring Suppl (ACCU-CHEK GUIDE ME) w/Device KIT, 1 Piece by Does not apply route as directed., Disp: 1 kit, Rfl: 0   brimonidine (ALPHAGAN) 0.2 % ophthalmic solution, Place 1 drop into both eyes 3 (three) times daily., Disp: , Rfl:    cloNIDine (CATAPRES - DOSED IN MG/24 HR) 0.3 mg/24hr patch, Place 1 patch (0.3 mg total) onto the skin once a week. (Patient taking differently: Place 0.3 mg onto the skin once a week. Place patch on Sunday), Disp: 4 patch, Rfl: 12   clopidogrel (PLAVIX) 75 MG tablet, Take 1 tablet (75 mg total) by mouth daily., Disp: 30 tablet, Rfl: 6   Continuous Blood Gluc Receiver ([place]) DEVI, Use to check  blood sugar 3 times a day. Dx code e11.65, Disp: 1 each, Rfl: 1   Continuous Blood Gluc Sensor (DEXCOM G7 SENSOR) MISC, Use to check blood sugar 3 times a day. Dx code e11.65, Disp: 3 each, Rfl: 3   docusate sodium (COLACE) 100 MG capsule, Take 1 capsule (100 mg total) by mouth daily., Disp: 10 capsule, Rfl: 0   dorzolamide-timolol (COSOPT) 22.3-6.8 MG/ML ophthalmic solution, Place 1 drop into both eyes 2 (two) times daily., Disp: , Rfl:    doxazosin (CARDURA) 4 MG tablet, Take 4 mg by mouth daily., Disp: , Rfl:    gabapentin (NEURONTIN) 300 MG capsule, Take 3 capsules (900 mg total) by mouth 3 (three) times daily. 3 times a day, Disp: 270 capsule, Rfl: 3   glucose blood  (ACCU-CHEK GUIDE) test strip, Use as instructed 4 x daily. E11.65 (Patient taking differently: 3 (three) times daily.), Disp: 150 each, Rfl: 5   Insulin NPH, Human,, Isophane, (NOVOLIN N FLEXPEN) 100 UNIT/ML Kiwkpen, Inject 30 units in the morning and 45 units at bedtime, Disp: 15 mL, Rfl: 1   Insulin Regular Human (NOVOLIN R FLEXPEN RELION) 100 UNIT/ML KwikPen, Inject 1-9 Units as directed 3 (three) times daily before meals. Sliding scale CBG 70 - 120: 0 units CBG 121 - 150: 1 unit,  CBG 151 - 200: 2 units,  CBG 201 - 250: 3 units,  CBG 251 - 300: 5 units,  CBG 301 - 350: 7 units,  CBG 351 - 400: 9 units   CBG > 400: 9 units and notify your doctor, Disp: 15 mL, Rfl: 1   latanoprost (XALATAN) 0.005 % ophthalmic solution, Place 1 drop into both eyes at bedtime., Disp: , Rfl:    Multiple Vitamin (MULTIVITAMIN) capsule, Take 1 capsule by mouth daily., Disp: , Rfl:    sodium bicarbonate 650 MG tablet, Take 1,300 mg by mouth 3 (three) times daily., Disp: , Rfl:    sodium zirconium cyclosilicate (LOKELMA) 10 g PACK packet, Take 10 g by mouth daily., Disp: 30 packet, Rfl: 1   tacrolimus (PROGRAF) 1 MG capsule, Take 4 capsules (4 mg total) by mouth 2 (two) times daily., Disp: , Rfl:    Vitamin D, Ergocalciferol, (DRISDOL) 50000 units CAPS capsule, Take 50,000 Units by mouth See admin instructions. 15th of the month, Disp: , Rfl:    atorvastatin (LIPITOR) 10 MG tablet, Take 1 tablet by mouth once daily, Disp: 30 tablet, Rfl: 0   Dulaglutide (TRULICITY) 1.5 YM/4.1RA SOPN, INJECT 1.5 MG INTO THE SKIN ONCE WEEKLY, Disp: 4 mL, Rfl: 0   hydrALAZINE (APRESOLINE) 25 MG tablet, Take 1 tablet (25 mg total) by mouth 3 (three) times daily., Disp: 270 tablet, Rfl: 1   mupirocin ointment (BACTROBAN) 2 %, Apply 1 application. topically 2 (two) times daily. For wound care (Patient not taking: Reported on 01/17/2022), Disp: 30 g, Rfl: 0   ondansetron (ZOFRAN) 4 MG tablet, Take 1 tablet (4 mg total) by mouth every 6 (six) hours  as needed for nausea. (Patient not taking: Reported on 01/17/2022), Disp: 20 tablet, Rfl: 0   oxyCODONE (OXY IR/ROXICODONE) 5 MG immediate release tablet, Take 1 tablet (5 mg total) by mouth every 4 (four) hours as needed for severe pain (pain score 4-6). (Patient not taking: Reported on 01/17/2022), Disp: 45 tablet, Rfl: 0   oxyCODONE-acetaminophen (PERCOCET/ROXICET) 5-325 MG tablet, Take 1 tablet by mouth every 4 (four) hours as needed. (Patient not taking: Reported on 01/17/2022), Disp: 30 tablet, Rfl: 0  Current Facility-Administered  Medications:    sodium chloride flush (NS) 0.9 % injection 3 mL, 3 mL, Intravenous, Q12H, Arida, Muhammad A, MD   Allergies  Allergen Reactions   Doxycycline     Runs up pt's blood sugar and phosphorus   Bactrim [Sulfamethoxazole-Trimethoprim]     Runs pt's sugar and phosphorus   Flagyl [Metronidazole] Rash    Patient having hives to either the vancomycin or flagyl (both infusing together).   Vancomycin Rash    Patient having hives to either vancomycin or flagyl     Review of Systems  Constitutional: Negative.   HENT: Negative.    Eyes: Negative.   Respiratory: Negative.    Cardiovascular: Negative.   Gastrointestinal: Negative.   Psychiatric/Behavioral: Negative.       Today's Vitals   02/05/22 0915  BP: (!) 200/100  Pulse: 71  Temp: 98.4 F (36.9 C)  TempSrc: Oral  Weight: 215 lb (97.5 kg)  Height: 5' 7" (1.702 m)  PainSc: 0-No pain   Body mass index is 33.67 kg/m.    Objective:  Physical Exam Vitals reviewed.  Constitutional:      General: He is not in acute distress.    Appearance: Normal appearance. He is obese.  Eyes:     Pupils: Pupils are equal, round, and reactive to light.  Cardiovascular:     Rate and Rhythm: Normal rate and regular rhythm.     Pulses: Normal pulses.     Heart sounds: Normal heart sounds. No murmur heard. Pulmonary:     Effort: Pulmonary effort is normal. No respiratory distress.     Breath sounds:  Normal breath sounds. No wheezing.  Abdominal:     General: Bowel sounds are normal. There is distension.     Tenderness: There is no abdominal tenderness.  Skin:    General: Skin is warm.     Capillary Refill: Capillary refill takes less than 2 seconds.  Neurological:     General: No focal deficit present.     Mental Status: He is alert and oriented to person, place, and time.     Cranial Nerves: No cranial nerve deficit.     Motor: No weakness.  Psychiatric:        Mood and Affect: Mood normal.        Behavior: Behavior normal.        Thought Content: Thought content normal.        Judgment: Judgment normal.         Assessment And Plan:     1. Type 2 diabetes mellitus with stage 2 chronic kidney disease, with long-term current use of insulin (HCC) Comments: HgbA1c is improving slowly, continue medications and f/u with Endocrinology - BMP8+eGFR - Hemoglobin A1c  2. Benign hypertension with chronic kidney disease Comments: Blood pressure is elevated, he took his meds prior to coming into office. Advised to adhere to taking medications regularly. Also advised to avoid high salt foo - hydrALAZINE (APRESOLINE) 25 MG tablet; Take 1 tablet (25 mg total) by mouth 3 (three) times daily.  Dispense: 270 tablet; Refill: 1  3. Bloating Comments: Will check his BNP to see if he is retaining fluid.  - Brain natriuretic peptide  4. Need for influenza vaccination Influenza vaccine administered Encouraged to take Tylenol as needed for fever or muscle aches. - Flu Vaccine QUAD 6+ mos PF IM (Fluarix Quad PF)   Will check BNP due to abdomen is taught and some dyspnea at times.   Patient was given opportunity to  ask questions. Patient verbalized understanding of the plan and was able to repeat key elements of the plan. All questions were answered to their satisfaction.  [name], FNP   I, [name], FNP, have reviewed all documentation for this visit. The documentation on 02/05/22  for the exam, diagnosis, procedures, and orders are all accurate and complete.   IF YOU HAVE BEEN REFERRED TO A SPECIALIST, IT MAY TAKE 1-2 WEEKS TO SCHEDULE/PROCESS THE REFERRAL. IF YOU HAVE NOT HEARD FROM US/SPECIALIST IN TWO WEEKS, PLEASE GIVE [place] A CALL AT [phone] X [phone].   THE PATIENT IS ENCOURAGED TO PRACTICE SOCIAL DISTANCING DUE TO THE COVID-19 PANDEMIC.

## 2022-02-06 ENCOUNTER — Telehealth: Payer: Self-pay | Admitting: Family

## 2022-02-06 LAB — BMP8+EGFR
BUN/Creatinine Ratio: 22 (ref 10–24)
BUN: 30 mg/dL — ABNORMAL HIGH (ref 8–27)
CO2: 22 mmol/L (ref 20–29)
Calcium: 9.7 mg/dL (ref 8.6–10.2)
Chloride: 108 mmol/L — ABNORMAL HIGH (ref 96–106)
Creatinine, Ser: 1.35 mg/dL — ABNORMAL HIGH (ref 0.76–1.27)
Glucose: 200 mg/dL — ABNORMAL HIGH (ref 70–99)
Potassium: 5.5 mmol/L — ABNORMAL HIGH (ref 3.5–5.2)
Sodium: 142 mmol/L (ref 134–144)
eGFR: 60 mL/min/{1.73_m2} (ref >59)

## 2022-02-06 LAB — HEMOGLOBIN A1C
Est. average glucose Bld gHb Est-mCnc: 183 mg/dL
Hgb A1c MFr Bld: 8 % — ABNORMAL HIGH (ref 4.8–5.6)

## 2022-02-06 LAB — BRAIN NATRIURETIC PEPTIDE: BNP: 8.4 pg/mL (ref 0.0–100.0)

## 2022-02-06 NOTE — Telephone Encounter (Signed)
Pt needs for you to call [facility] about the script for his Prosthetic. Phone number  [phone]. Pt would like for you to call him after you call them.

## 2022-02-07 NOTE — Telephone Encounter (Signed)
I called [facility] and they have everything that they need just waiting on insurance auth and I called the pt to advise.

## 2022-02-15 ENCOUNTER — Other Ambulatory Visit: Payer: Self-pay | Admitting: Cardiovascular Disease

## 2022-02-16 ENCOUNTER — Emergency Department (HOSPITAL_COMMUNITY): Payer: 59

## 2022-02-16 ENCOUNTER — Encounter (HOSPITAL_COMMUNITY): Payer: Self-pay

## 2022-02-16 ENCOUNTER — Emergency Department (HOSPITAL_COMMUNITY)
Admission: EM | Admit: 2022-02-16 | Discharge: 2022-02-16 | Disposition: A | Discharge status: Home / Self Care | Payer: 59 | Attending: Emergency Medicine | Admitting: Emergency Medicine

## 2022-02-16 ENCOUNTER — Other Ambulatory Visit: Payer: Self-pay

## 2022-02-16 DIAGNOSIS — Z7982 Long term (current) use of aspirin: Secondary | ICD-10-CM | POA: Insufficient documentation

## 2022-02-16 DIAGNOSIS — R809 Proteinuria, unspecified: Secondary | ICD-10-CM

## 2022-02-16 DIAGNOSIS — R600 Localized edema: Secondary | ICD-10-CM | POA: Diagnosis not present

## 2022-02-16 DIAGNOSIS — Z7901 Long term (current) use of anticoagulants: Secondary | ICD-10-CM | POA: Diagnosis not present

## 2022-02-16 DIAGNOSIS — M7989 Other specified soft tissue disorders: Secondary | ICD-10-CM | POA: Diagnosis not present

## 2022-02-16 DIAGNOSIS — J811 Chronic pulmonary edema: Secondary | ICD-10-CM | POA: Diagnosis not present

## 2022-02-16 LAB — COMPREHENSIVE METABOLIC PANEL
ALT: 25 U/L (ref 0–44)
AST: 20 U/L (ref 15–41)
Albumin: 3.5 g/dL (ref 3.5–5.0)
Alkaline Phosphatase: 175 U/L — ABNORMAL HIGH (ref 38–126)
Anion gap: 9 (ref 5–15)
BUN: 30 mg/dL — ABNORMAL HIGH (ref 8–23)
CO2: 22 mmol/L (ref 22–32)
Calcium: 9.2 mg/dL (ref 8.9–10.3)
Chloride: 106 mmol/L (ref 98–111)
Creatinine, Ser: 1.44 mg/dL — ABNORMAL HIGH (ref 0.61–1.24)
GFR, Estimated: 55 mL/min — ABNORMAL LOW (ref >60)
Glucose, Bld: 385 mg/dL — ABNORMAL HIGH (ref 70–99)
Potassium: 5.3 mmol/L — ABNORMAL HIGH (ref 3.5–5.1)
Sodium: 137 mmol/L (ref 135–145)
Total Bilirubin: 0.5 mg/dL (ref 0.3–1.2)
Total Protein: 7.7 g/dL (ref 6.5–8.1)

## 2022-02-16 LAB — CBC WITH DIFFERENTIAL/PLATELET
Abs Immature Granulocytes: 0.01 10*3/uL (ref 0.00–0.07)
Basophils Absolute: 0 10*3/uL (ref 0.0–0.1)
Basophils Relative: 1 %
Eosinophils Absolute: 0.2 10*3/uL (ref 0.0–0.5)
Eosinophils Relative: 6 %
HCT: 40.5 % (ref 39.0–52.0)
Hemoglobin: 12 g/dL — ABNORMAL LOW (ref 13.0–17.0)
Immature Granulocytes: 0 %
Lymphocytes Relative: 41 %
Lymphs Abs: 1.4 10*3/uL (ref 0.7–4.0)
MCH: 23.8 pg — ABNORMAL LOW (ref 26.0–34.0)
MCHC: 29.6 g/dL — ABNORMAL LOW (ref 30.0–36.0)
MCV: 80.2 fL (ref 80.0–100.0)
Monocytes Absolute: 0.3 10*3/uL (ref 0.1–1.0)
Monocytes Relative: 10 %
Neutro Abs: 1.4 10*3/uL — ABNORMAL LOW (ref 1.7–7.7)
Neutrophils Relative %: 42 %
Platelets: 127 10*3/uL — ABNORMAL LOW (ref 150–400)
RBC: 5.05 MIL/uL (ref 4.22–5.81)
RDW: 17.2 % — ABNORMAL HIGH (ref 11.5–15.5)
WBC: 3.4 10*3/uL — ABNORMAL LOW (ref 4.0–10.5)
nRBC: 0 % (ref 0.0–0.2)

## 2022-02-16 LAB — CBG MONITORING, ED
Glucose-Capillary: 229 mg/dL — ABNORMAL HIGH (ref 70–99)
Glucose-Capillary: 367 mg/dL — ABNORMAL HIGH (ref 70–99)

## 2022-02-16 LAB — URINALYSIS, ROUTINE W REFLEX MICROSCOPIC
Bacteria, UA: NONE SEEN
Bilirubin Urine: NEGATIVE
Glucose, UA: >=500 mg/dL — AB
Hgb urine dipstick: NEGATIVE
Ketones, ur: NEGATIVE mg/dL
Leukocytes,Ua: NEGATIVE
Nitrite: NEGATIVE
Protein, ur: 100 mg/dL — AB
Specific Gravity, Urine: 1.021 (ref 1.005–1.030)
pH: 6 (ref 5.0–8.0)

## 2022-02-16 LAB — BRAIN NATRIURETIC PEPTIDE: B Natriuretic Peptide: 49 pg/mL (ref 0.0–100.0)

## 2022-02-16 MED ORDER — FUROSEMIDE 10 MG/ML IJ SOLN
40.0000 mg | Freq: Once | INTRAMUSCULAR | Status: AC
  Administered 2022-02-16: 40 mg via INTRAVENOUS
  Filled 2022-02-16: qty 4

## 2022-02-16 MED ORDER — INSULIN ASPART 100 UNIT/ML IJ SOLN: 10.0000 [IU] | Freq: Once | INTRAMUSCULAR | Status: DC

## 2022-02-16 NOTE — ED Notes (Signed)
Patient transported to X-ray 

## 2022-02-16 NOTE — Discharge Instructions (Addendum)
There was protein in your urine today.  This may be causing some of your swelling.  You need to see your nephrologist in the office this week.

## 2022-02-16 NOTE — ED Triage Notes (Signed)
Pt states his Kidney doctor told him to come here for blood work and imaging due to swelling. Pt had kidney transplant in 2016.

## 2022-02-16 NOTE — ED Provider Notes (Signed)
[facility] EMERGENCY DEPARTMENT Provider Note   CSN: [id number] Arrival date & time: 02/16/22  0008     History  Chief Complaint  Patient presents with   Leg Swelling    Raymond [name] is a 61 y.o. male.  Patient presents to the emergency department for evaluation of swelling of his legs.  Patient reports that his kidney doctor told him to come to the emergency department to get "a scan".  He is not sure what test his doctor wanted him to get.  Patient reports that his left leg is more swollen than usual, he has swelling in the stump of his right leg.  Swelling is up to the abdomen.  He has not had any chest pain or shortness of breath.       Home Medications Prior to Admission medications   Medication Sig Start Date End Date Taking? Authorizing Provider  aspirin EC 81 MG tablet Take 1 tablet (81 mg total) by mouth daily. Swallow whole. 09/11/21 09/11/22  Wellington Hampshire, MD  atorvastatin (LIPITOR) 10 MG tablet Take 10 mg by mouth daily.    [provider]  blood glucose meter kit and supplies KIT Dispense based on patient and insurance preference. Use up to four times daily as directed. (FOR ICD-9 250.00, 250.01). 04/11/20   [name], MD  Blood Glucose Monitoring Suppl (ACCU-CHEK GUIDE ME) w/Device KIT 1 Piece by Does not apply route as directed. 02/18/18   [name], MD  brimonidine (ALPHAGAN) 0.2 % ophthalmic solution Place 1 drop into both eyes 3 (three) times daily. 07/25/21   [provider]  cloNIDine (CATAPRES - DOSED IN MG/24 HR) 0.3 mg/24hr patch Place 1 patch (0.3 mg total) onto the skin once a week. Patient taking differently: Place 0.3 mg onto the skin once a week. Place patch on Sunday 08/21/21   Lorretta Harp, MD  clopidogrel (PLAVIX) 75 MG tablet Take 1 tablet (75 mg total) by mouth daily. 09/11/21 09/11/22  Wellington Hampshire, MD  Continuous Blood Gluc Receiver (DEXCOM G7 RECEIVER) DEVI Use to check blood sugar 3  times a day. Dx code e11.65 12/16/21   [name], FNP  Continuous Blood Gluc Sensor (DEXCOM G7 SENSOR) MISC Use to check blood sugar 3 times a day. Dx code e11.65 12/16/21   [name], FNP  docusate sodium (COLACE) 100 MG capsule Take 1 capsule (100 mg total) by mouth daily. 11/20/21   Pokhrel, Corrie Mckusick, MD  dorzolamide-timolol (COSOPT) 22.3-6.8 MG/ML ophthalmic solution Place 1 drop into both eyes 2 (two) times daily. 07/25/21   [provider]  doxazosin (CARDURA) 4 MG tablet Take 4 mg by mouth daily. 12/18/15   [provider]  Dulaglutide (TRULICITY) 1.5 CN/4.7SJ SOPN INJECT 1.5 MG INTO THE SKIN ONCE WEEKLY 02/06/22   Elayne Snare, MD  gabapentin (NEURONTIN) 300 MG capsule Take 3 capsules (900 mg total) by mouth 3 (three) times daily. 3 times a day 11/22/21   Dondra Prader R, NP  glucose blood (ACCU-CHEK GUIDE) test strip Use as instructed 4 x daily. E11.65 Patient taking differently: 3 (three) times daily. 02/19/18   [name], MD  hydrALAZINE (APRESOLINE) 25 MG tablet Take 1 tablet (25 mg total) by mouth 3 (three) times daily. 02/05/22   [name], FNP  Insulin NPH, Human,, Isophane, (NOVOLIN N FLEXPEN) 100 UNIT/ML Kiwkpen Inject 30 units in the morning and 45 units at bedtime 01/23/22   Elayne Snare, MD  Insulin Regular Human (NOVOLIN R  FLEXPEN RELION) 100 UNIT/ML KwikPen Inject 1-9 Units as directed 3 (three) times daily before meals. Sliding scale CBG 70 - 120: 0 units CBG 121 - 150: 1 unit,  CBG 151 - 200: 2 units,  CBG 201 - 250: 3 units,  CBG 251 - 300: 5 units,  CBG 301 - 350: 7 units,  CBG 351 - 400: 9 units   CBG > 400: 9 units and notify your doctor 11/14/21   [name], FNP  latanoprost (XALATAN) 0.005 % ophthalmic solution Place 1 drop into both eyes at bedtime. 07/25/21   [provider]  Multiple Vitamin (MULTIVITAMIN) capsule Take 1 capsule by mouth daily.    [provider]  mupirocin ointment (BACTROBAN) 2 % Apply 1 application.  topically 2 (two) times daily. For wound care Patient not taking: Reported on 01/17/2022 09/12/21   Landis Martins, DPM  ondansetron (ZOFRAN) 4 MG tablet Take 1 tablet (4 mg total) by mouth every 6 (six) hours as needed for nausea. Patient not taking: Reported on 01/17/2022 11/19/21   Pokhrel, Corrie Mckusick, MD  oxyCODONE (OXY IR/ROXICODONE) 5 MG immediate release tablet Take 1 tablet (5 mg total) by mouth every 4 (four) hours as needed for severe pain (pain score 4-6). Patient not taking: Reported on 01/17/2022 11/25/21   Newt Minion, MD  oxyCODONE-acetaminophen (PERCOCET/ROXICET) 5-325 MG tablet Take 1 tablet by mouth every 4 (four) hours as needed. Patient not taking: Reported on 01/17/2022 09/25/21   Newt Minion, MD  sodium bicarbonate 650 MG tablet Take 1,300 mg by mouth 3 (three) times daily. 11/02/18   [provider]  sodium zirconium cyclosilicate (LOKELMA) 10 g PACK packet Take 10 g by mouth daily. 08/30/21   Rai, Vernelle Emerald, MD  tacrolimus (PROGRAF) 1 MG capsule Take 4 capsules (4 mg total) by mouth 2 (two) times daily. 08/30/21   Rai, Vernelle Emerald, MD  Vitamin D, Ergocalciferol, (DRISDOL) 50000 units CAPS capsule Take 50,000 Units by mouth See admin instructions. 15th of the month    [provider]      Allergies    Doxycycline, Bactrim [sulfamethoxazole-trimethoprim], Flagyl [metronidazole], and Vancomycin    Review of Systems   Review of Systems  Physical Exam Updated Vital Signs BP (!) 175/98   Pulse 61   Temp 97.8 F (36.6 C)   Resp 20   Ht '5\' 7"'  (1.702 m)   Wt 114.7 kg   SpO2 96%   BMI 39.60 kg/m  Physical Exam Vitals and nursing note reviewed.  Constitutional:      General: He is not in acute distress.    Appearance: He is well-developed.  HENT:     Head: Normocephalic and atraumatic.     Mouth/Throat:     Mouth: Mucous membranes are moist.  Eyes:     General: Vision grossly intact. Gaze aligned appropriately.     Extraocular Movements: Extraocular  movements intact.     Conjunctiva/sclera: Conjunctivae normal.  Cardiovascular:     Rate and Rhythm: Normal rate and regular rhythm.     Pulses: Normal pulses.     Heart sounds: Normal heart sounds, S1 normal and S2 normal. No murmur heard.    No friction rub. No gallop.  Pulmonary:     Effort: Pulmonary effort is normal. No respiratory distress.     Breath sounds: Normal breath sounds.  Abdominal:     Palpations: Abdomen is soft.     Tenderness: There is no abdominal tenderness. There is no guarding or  rebound.     Hernia: No hernia is present.  Musculoskeletal:        General: No swelling.     Cervical back: Full passive range of motion without pain, normal range of motion and neck supple. No pain with movement, spinous process tenderness or muscular tenderness. Normal range of motion.     Right lower leg: Edema present.     Left lower leg: Edema present.     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  Skin:    General: Skin is warm and dry.     Capillary Refill: Capillary refill takes less than 2 seconds.     Findings: No ecchymosis, erythema, lesion or wound.  Neurological:     Mental Status: He is alert and oriented to person, place, and time.     GCS: GCS eye subscore is 4. GCS verbal subscore is 5. GCS motor subscore is 6.     Cranial Nerves: Cranial nerves 2-12 are intact.     Sensory: Sensation is intact.     Motor: Motor function is intact. No weakness or abnormal muscle tone.     Coordination: Coordination is intact.  Psychiatric:        Mood and Affect: Mood normal.        Speech: Speech normal.        Behavior: Behavior normal.     ED Results / Procedures / Treatments   Labs (all labs ordered are listed, but only abnormal results are displayed) Labs Reviewed  CBC WITH DIFFERENTIAL/PLATELET - Abnormal; Notable for the following components:      Result Value   WBC 3.4 (*)    Hemoglobin 12.0 (*)    MCH 23.8 (*)    MCHC 29.6 (*)    RDW 17.2 (*)     Platelets 127 (*)    Neutro Abs 1.4 (*)    All other components within normal limits  COMPREHENSIVE METABOLIC PANEL - Abnormal; Notable for the following components:   Potassium 5.3 (*)    Glucose, Bld 385 (*)    BUN 30 (*)    Creatinine, Ser 1.44 (*)    Alkaline Phosphatase 175 (*)    GFR, Estimated 55 (*)    All other components within normal limits  URINALYSIS, ROUTINE W REFLEX MICROSCOPIC - Abnormal; Notable for the following components:   Glucose, UA >=500 (*)    Protein, ur 100 (*)    All other components within normal limits  CBG MONITORING, ED - Abnormal; Notable for the following components:   Glucose-Capillary 367 (*)    All other components within normal limits  CBG MONITORING, ED - Abnormal; Notable for the following components:   Glucose-Capillary 229 (*)    All other components within normal limits  BRAIN NATRIURETIC PEPTIDE    EKG None  Radiology DG Chest 2 View  Result Date: 02/16/2022 CLINICAL DATA:  Edema EXAM: CHEST - 2 VIEW COMPARISON:  01/20/2020 FINDINGS: Heart and mediastinal contours are within normal limits. No focal opacities or effusions. No acute bony abnormality. IMPRESSION: No active cardiopulmonary disease. Electronically Signed   By: Rolm Baptise M.D.   On: 02/16/2022 01:15    Procedures Procedures    Medications Ordered in ED Medications  insulin aspart (novoLOG) injection 10 Units (10 Units Subcutaneous Not Given 02/16/22 0217)  furosemide (LASIX) injection 40 mg (has no administration in time range)    ED Course/ Medical Decision Making/ A&P  Medical Decision Making Amount and/or Complexity of Data Reviewed Labs: ordered. Radiology: ordered.  Risk Prescription drug management.   Patient presents to the emergency department for evaluation of leg swelling.  Patient reports that his nephrologist told him to come to the ER to get worked up.  Records indicate that he had a similar presentation on September  3 as well.  Somehow this work-up has not been performed as an outpatient.   His work-up today is reassuring.  Difficult to ascertain if this is symmetric swelling because he has a right BKA.  This looks like chronic swelling.  No evidence of congestive heart failure.  Serum protein and albumin are normal.  Patient's creatinine is at baseline, no sign of acute renal failure. He does have proteinuria.  Will give single dose of Lasix.  He will need prompt follow-up with nephrology. Arrange for outpatient venous duplex in the morning.  Does not require hospitalization at this time.        Final Clinical Impression(s) / ED Diagnoses Final diagnoses:  Bilateral lower extremity edema  Proteinuria, unspecified type    Rx / DC Orders ED Discharge Orders          Ordered    US Venous Img Lower Bilateral        02/16/22 0222              Orpah Greek, MD 02/16/22 0225

## 2022-02-19 DIAGNOSIS — E669 Obesity, unspecified: Secondary | ICD-10-CM | POA: Diagnosis not present

## 2022-02-19 DIAGNOSIS — E119 Type 2 diabetes mellitus without complications: Secondary | ICD-10-CM | POA: Diagnosis not present

## 2022-02-19 DIAGNOSIS — E11621 Type 2 diabetes mellitus with foot ulcer: Secondary | ICD-10-CM | POA: Diagnosis not present

## 2022-02-19 DIAGNOSIS — D631 Anemia in chronic kidney disease: Secondary | ICD-10-CM | POA: Diagnosis not present

## 2022-02-19 DIAGNOSIS — Z79899 Other long term (current) drug therapy: Secondary | ICD-10-CM | POA: Diagnosis not present

## 2022-02-19 DIAGNOSIS — N189 Chronic kidney disease, unspecified: Secondary | ICD-10-CM | POA: Diagnosis not present

## 2022-02-19 DIAGNOSIS — Z940 Kidney transplant status: Secondary | ICD-10-CM | POA: Diagnosis not present

## 2022-02-19 DIAGNOSIS — R609 Edema, unspecified: Secondary | ICD-10-CM | POA: Diagnosis not present

## 2022-02-19 DIAGNOSIS — N2581 Secondary hyperparathyroidism of renal origin: Secondary | ICD-10-CM | POA: Diagnosis not present

## 2022-02-19 DIAGNOSIS — I10 Essential (primary) hypertension: Secondary | ICD-10-CM | POA: Diagnosis not present

## 2022-02-19 DIAGNOSIS — E875 Hyperkalemia: Secondary | ICD-10-CM | POA: Diagnosis not present

## 2022-02-19 DIAGNOSIS — L02611 Cutaneous abscess of right foot: Secondary | ICD-10-CM | POA: Diagnosis not present

## 2022-02-25 ENCOUNTER — Encounter: Payer: 59 | Admitting: Dietician

## 2022-02-25 ENCOUNTER — Encounter (HOSPITAL_COMMUNITY): Payer: Self-pay | Admitting: Emergency Medicine

## 2022-02-25 ENCOUNTER — Other Ambulatory Visit: Payer: Self-pay

## 2022-02-25 ENCOUNTER — Emergency Department (HOSPITAL_COMMUNITY)
Admission: EM | Admit: 2022-02-25 | Discharge: 2022-02-25 | Disposition: A | Discharge status: Home / Self Care | Payer: 59 | Attending: Emergency Medicine | Admitting: Emergency Medicine

## 2022-02-25 ENCOUNTER — Emergency Department (HOSPITAL_COMMUNITY): Payer: 59

## 2022-02-25 DIAGNOSIS — R519 Headache, unspecified: Secondary | ICD-10-CM

## 2022-02-25 DIAGNOSIS — Y92410 Unspecified street and highway as the place of occurrence of the external cause: Secondary | ICD-10-CM | POA: Diagnosis not present

## 2022-02-25 DIAGNOSIS — Z794 Long term (current) use of insulin: Secondary | ICD-10-CM | POA: Diagnosis not present

## 2022-02-25 DIAGNOSIS — Z7982 Long term (current) use of aspirin: Secondary | ICD-10-CM | POA: Diagnosis not present

## 2022-02-25 DIAGNOSIS — S0990XA Unspecified injury of head, initial encounter: Secondary | ICD-10-CM | POA: Insufficient documentation

## 2022-02-25 DIAGNOSIS — S199XXA Unspecified injury of neck, initial encounter: Secondary | ICD-10-CM | POA: Insufficient documentation
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
## 2022-02-27 ENCOUNTER — Encounter: Payer: 59 | Attending: Nurse Practitioner | Admitting: Dietician

## 2022-02-27 ENCOUNTER — Encounter: Payer: Self-pay | Admitting: Dietician

## 2022-02-27 DIAGNOSIS — Z89511 Acquired absence of right leg below knee: Secondary | ICD-10-CM | POA: Diagnosis not present

## 2022-02-27 DIAGNOSIS — Z794 Long term (current) use of insulin: Secondary | ICD-10-CM | POA: Diagnosis not present

## 2022-02-27 DIAGNOSIS — N182 Chronic kidney disease, stage 2 (mild): Secondary | ICD-10-CM | POA: Insufficient documentation

## 2022-02-27 DIAGNOSIS — Z713 Dietary counseling and surveillance: Secondary | ICD-10-CM | POA: Insufficient documentation

## 2022-02-27 DIAGNOSIS — Z940 Kidney transplant status: Secondary | ICD-10-CM | POA: Insufficient documentation

## 2022-02-27 DIAGNOSIS — E1122 Type 2 diabetes mellitus with diabetic chronic kidney disease: Secondary | ICD-10-CM | POA: Insufficient documentation

## 2022-02-27 NOTE — Patient Instructions (Addendum)
Continue to stay active Avoid salt substitutes (any salt that contains potassium in the ingredient list).  Use black pepper, herbs and spices such as garlic and onion powder instead of salt. Be mindful about the choices you make when you eat out.  Ask for foods to be prepared without salt when able.  Use little butter  Baked rather than fried  Continue to take your medication as prescribed  Insulin instruction per MD notes: Novolin N 30 units each morning and 45 units every night Novolin R (fast acting) take 15-30 minutes before every meal regardless of blood sugar  Take 15 units for a small meal  Take 20 units for a medium or large meal If your blood glucose is over 200 after a particular meal, increase the dose for that meal by 5 units the next day.  If you continue to experience low blood glucose you will likely need to decrease your N at night.  Discuss with your doctor.   A prescription for the 3 mg Trulicity was requested.  This should be taken weekly.
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
## 2022-03-01 ENCOUNTER — Other Ambulatory Visit: Payer: Self-pay | Admitting: Endocrinology

## 2022-03-03 ENCOUNTER — Telehealth: Payer: Self-pay

## 2022-03-03 ENCOUNTER — Other Ambulatory Visit: Payer: Self-pay

## 2022-03-03 DIAGNOSIS — E1165 Type 2 diabetes mellitus with hyperglycemia: Secondary | ICD-10-CM

## 2022-03-03 MED ORDER — TRULICITY 3 MG/0.5ML ~~LOC~~ SOAJ: 3.0000 mg | SUBCUTANEOUS | 1 refills | Status: DC

## 2022-03-03 NOTE — Telephone Encounter (Signed)
Transition Care Management Unsuccessful Follow-up Telephone Call  Date of discharge and from where:  02/25/2022 [place] [facility]   Attempts:  1st Attempt  Reason for unsuccessful TCM follow-up call:  Left voice message

## 2022-03-05 ENCOUNTER — Ambulatory Visit: Payer: 59 | Attending: Cardiovascular Disease | Admitting: Cardiovascular Disease

## 2022-03-05 ENCOUNTER — Other Ambulatory Visit: Payer: Self-pay | Admitting: Endocrinology

## 2022-03-05 DIAGNOSIS — E1165 Type 2 diabetes mellitus with hyperglycemia: Secondary | ICD-10-CM

## 2022-03-06 ENCOUNTER — Telehealth: Payer: Self-pay

## 2022-03-06 NOTE — Telephone Encounter (Signed)
Transition Care Management Follow-up Telephone Call Date of discharge and from where: 02/25/2022 How have you been since you were released from the hospital? Pt states he is doing just fine, he does not need a follow up appointment at this time. Any questions or concerns? No  Items Reviewed: Did the pt receive and understand the discharge instructions provided? Yes  Medications obtained and verified? Yes  Other? No  Any new allergies since your discharge? No  Dietary orders reviewed? Yes Do you have support at home? Yes   Home Care and Equipment/Supplies: Were home health services ordered? no If so, what is the name of the agency? N/a  Has the agency set up a time to come to the patient's home? no Were any new equipment or medical supplies ordered?  No What is the name of the medical supply agency? N/a Were you able to get the supplies/equipment? no Do you have any questions related to the use of the equipment or supplies? No  Functional Questionnaire: (I = Independent and D = Dependent) ADLs: i  Bathing/Dressing- i  Meal Prep- i  Eating- i  Maintaining continence- i  Transferring/Ambulation- i  Managing Meds- i  Follow up appointments reviewed:  PCP Hospital f/u appt confirmed? No  Scheduled to see n/a on n/a @ n/a. [facility] f/u appt confirmed? No  Scheduled to see n/a on n/a @ n/a. Are transportation arrangements needed? No  If their condition worsens, is the pt aware to call PCP or go to the Emergency Dept.? Yes Was the patient provided with contact information for the PCP's office or ED? Yes Was to pt encouraged to call back with questions or concerns? Yes

## 2022-03-09 ENCOUNTER — Other Ambulatory Visit: Payer: Self-pay | Admitting: Cardiovascular Disease

## 2022-03-10 ENCOUNTER — Telehealth: Payer: Self-pay | Admitting: Orthopedic Surgery

## 2022-03-10 ENCOUNTER — Ambulatory Visit: Payer: 59 | Admitting: Dietician

## 2022-03-10 NOTE — Telephone Encounter (Signed)
Rx written and given to pt while in office today

## 2022-03-10 NOTE — Telephone Encounter (Signed)
Patient came in stating they need a copy of Rx for Leg that was sent to [facility] clinic in [place] but they are Out of network and will not give the Rx back so they can go to the [facility] in [place]

## 2022-03-21 ENCOUNTER — Ambulatory Visit: Payer: 59 | Admitting: Family

## 2022-03-21 DIAGNOSIS — L02611 Cutaneous abscess of right foot: Secondary | ICD-10-CM | POA: Diagnosis not present

## 2022-03-21 DIAGNOSIS — E11621 Type 2 diabetes mellitus with foot ulcer: Secondary | ICD-10-CM | POA: Diagnosis not present

## 2022-03-21 DIAGNOSIS — E669 Obesity, unspecified: Secondary | ICD-10-CM | POA: Diagnosis not present

## 2022-03-21 DIAGNOSIS — E875 Hyperkalemia: Secondary | ICD-10-CM | POA: Diagnosis not present

## 2022-03-21 DIAGNOSIS — N189 Chronic kidney disease, unspecified: Secondary | ICD-10-CM | POA: Diagnosis not present

## 2022-04-03 ENCOUNTER — Other Ambulatory Visit: Payer: Self-pay

## 2022-04-03 DIAGNOSIS — S88111A Complete traumatic amputation at level between knee and ankle, right lower leg, initial encounter: Secondary | ICD-10-CM

## 2022-04-08 ENCOUNTER — Ambulatory Visit: Payer: 59 | Admitting: Family

## 2022-04-12 ENCOUNTER — Other Ambulatory Visit: Payer: Self-pay | Admitting: Endocrinology

## 2022-04-12 DIAGNOSIS — E1165 Type 2 diabetes mellitus with hyperglycemia: Secondary | ICD-10-CM

## 2022-04-14 ENCOUNTER — Telehealth: Payer: Self-pay | Admitting: Orthopedic Surgery

## 2022-04-14 NOTE — Telephone Encounter (Signed)
LTD forms received. Last seen 12/17/21 . R BKA 11/16/21. Please advise on forms. Thank you.

## 2022-04-15 ENCOUNTER — Encounter: Payer: Self-pay | Admitting: Physical Therapy

## 2022-04-15 ENCOUNTER — Other Ambulatory Visit: Payer: Self-pay

## 2022-04-15 ENCOUNTER — Ambulatory Visit (INDEPENDENT_AMBULATORY_CARE_PROVIDER_SITE_OTHER): Payer: 59 | Admitting: Physical Therapy

## 2022-04-15 DIAGNOSIS — M6281 Muscle weakness (generalized): Secondary | ICD-10-CM

## 2022-04-15 DIAGNOSIS — S81801A Unspecified open wound, right lower leg, initial encounter: Secondary | ICD-10-CM | POA: Diagnosis not present

## 2022-04-15 DIAGNOSIS — R2681 Unsteadiness on feet: Secondary | ICD-10-CM | POA: Diagnosis not present

## 2022-04-15 DIAGNOSIS — R2689 Other abnormalities of gait and mobility: Secondary | ICD-10-CM | POA: Diagnosis not present

## 2022-04-15 NOTE — Telephone Encounter (Signed)
Pt has follow up after this visit? Unless wants to have [place] complete with out of work time till 05/27/2022
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
## 2022-04-21 ENCOUNTER — Encounter: Payer: Self-pay | Admitting: Physical Therapy

## 2022-04-21 ENCOUNTER — Ambulatory Visit (INDEPENDENT_AMBULATORY_CARE_PROVIDER_SITE_OTHER): Payer: 59 | Admitting: Physical Therapy

## 2022-04-21 DIAGNOSIS — L02611 Cutaneous abscess of right foot: Secondary | ICD-10-CM | POA: Diagnosis not present

## 2022-04-21 DIAGNOSIS — R2689 Other abnormalities of gait and mobility: Secondary | ICD-10-CM | POA: Diagnosis not present

## 2022-04-21 DIAGNOSIS — E669 Obesity, unspecified: Secondary | ICD-10-CM | POA: Diagnosis not present

## 2022-04-21 DIAGNOSIS — E875 Hyperkalemia: Secondary | ICD-10-CM | POA: Diagnosis not present

## 2022-04-21 DIAGNOSIS — M6281 Muscle weakness (generalized): Secondary | ICD-10-CM | POA: Diagnosis not present

## 2022-04-21 DIAGNOSIS — E11621 Type 2 diabetes mellitus with foot ulcer: Secondary | ICD-10-CM | POA: Diagnosis not present

## 2022-04-21 DIAGNOSIS — S81801A Unspecified open wound, right lower leg, initial encounter: Secondary | ICD-10-CM | POA: Diagnosis not present

## 2022-04-21 DIAGNOSIS — R2681 Unsteadiness on feet: Secondary | ICD-10-CM

## 2022-04-21 DIAGNOSIS — N189 Chronic kidney disease, unspecified: Secondary | ICD-10-CM | POA: Diagnosis not present

## 2022-04-21 NOTE — Therapy (Signed)
OUTPATIENT PHYSICAL THERAPY TREATMENT NOTE   Patient Name: Raymond [name] MRN: [id number] DOB:February 03, 1961, 61 y.o., male Today's Date: 04/21/2022  PCP: [name], FNP  REFERRING PROVIDER: Newt Minion, MD   END OF SESSION:   PT End of Session - 04/21/22 1256     Visit Number 2    Number of Visits 25    Date for PT Re-Evaluation 08/01/22    Authorization Type [employer]    Authorization Time Period NO COPAY, 35 PT/OT VISITS Calendar Year Benefit Date 05/26/2021 - 05/25/2022    Authorization - Visit Number 2    Authorization - Number of Visits 35    Progress Note Due on Visit 10    PT Start Time 1300    PT Stop Time 1345    PT Time Calculation (min) 45 min    Equipment Utilized During Treatment Gait belt    Activity Tolerance Patient tolerated treatment well    Behavior During Therapy WFL for tasks assessed/performed             Past Medical History:  Diagnosis Date   Diabetes ([place])    Diabetes mellitus without complication ([place])    type 2   ESRD (end stage renal disease) ([place])    03/09/2015- patient had a kidney transplant   Hypertension    Peripheral vascular disease ([place])    Renal disorder    Renal insufficiency    Past Surgical History:  Procedure Laterality Date   ABDOMINAL AORTOGRAM W/LOWER EXTREMITY N/A 09/11/2021   Procedure: ABDOMINAL AORTOGRAM W/LOWER EXTREMITY;  Surgeon: Wellington Hampshire, MD;  Location: [facility];  Service: Cardiovascular;  Laterality: N/A;   AMPUTATION Right 09/25/2021   Procedure: RIGHT TRANSMETATARSAL AMPUTATION AND APPLY TISSUE GRAFT;  Surgeon: Newt Minion, MD;  Location: [facility];  Service: Orthopedics;  Laterality: Right;   AMPUTATION Right 11/16/2021   Procedure: RIGHT AMPUTATION BELOW KNEE WITH WOUND VAC APPLICATION;  Surgeon: Newt Minion, MD;  Location: [facility];  Service: Orthopedics;  Laterality: Right;   BONE BIOPSY Right 03/06/2021   Procedure: BONE BIOPSY;  Surgeon: Trula Slade, DPM;  Location: [facility];  Service: Podiatry;  Laterality: Right;   CENTRAL VENOUS CATHETER INSERTION Right 01/20/2020   Procedure: INSERTION CENTRAL LINE ADULT; Tunneled central line;  Surgeon: Virl Cagey, MD;  Location: [facility];  Service: General;  Laterality: Right;   COLONOSCOPY N/A 12/05/2014   IOX:BDZHGDJM external and internal hemorrhoid/mild diverticulosis/11 polyps removed   ESOPHAGOGASTRODUODENOSCOPY N/A 12/05/2014   EQA:STMH duodenitis   GRAFT APPLICATION Right 96/22/2979   Procedure: GRAFT APPLICATION;  Surgeon: Trula Slade, DPM;  Location: [facility];  Service: Podiatry;  Laterality: Right;   INCISION AND DRAINAGE OF WOUND Right 08/15/2021   Procedure: IRRIGATION AND DEBRIDEMENT WOUND;  Surgeon: Landis Martins, DPM;  Location: [facility];  Service: Podiatry;  Laterality: Right;  need to make card not  a irrigation and debridement card   IR FLUORO GUIDE CV LINE RIGHT  03/01/2019   IR PERC TUN PERIT CATH WO PORT S&I /IMAG  06/27/2021   IR REMOVAL TUN CV CATH W/O FL  05/10/2019   IR REMOVAL TUN CV CATH W/O FL  02/29/2020   IR REMOVAL TUN CV CATH W/O FL  07/19/2021   IR US GUIDE VASC ACCESS RIGHT  03/01/2019   IR US GUIDE VASC ACCESS RIGHT  06/27/2021   IRRIGATION AND DEBRIDEMENT ELBOW Left 06/24/2021   Procedure: IRRIGATION AND DEBRIDEMENT ELBOW;  Surgeon: Lovell Sheehan,  MD;  Location: [facility];  Service: Orthopedics;  Laterality: Left;   KIDNEY TRANSPLANT  03/27/2015   Penile Pump Insertion     PERIPHERAL VASCULAR ATHERECTOMY  09/11/2021   Procedure: PERIPHERAL VASCULAR ATHERECTOMY;  Surgeon: Wellington Hampshire, MD;  Location: [facility];  Service: Cardiovascular;;  Shockwave Lithotripsy   TEE WITHOUT CARDIOVERSION N/A 01/17/2020   Procedure: TRANSESOPHAGEAL ECHOCARDIOGRAM (TEE) WITH PROPOFOL;  Surgeon: Arnoldo Lenis, MD;  [facility];  Service: Endoscopy;  Laterality: N/A;   WOUND DEBRIDEMENT Right 03/06/2021   Procedure: DEBRIDEMENT WOUND;  Surgeon: Trula Slade,  DPM;  Location: [facility];  Service: Podiatry;  Laterality: Right;   Patient Active Problem List   Diagnosis Date Noted   Cutaneous abscess of right foot    Hyperkalemia 08/28/2021   Obesity (BMI 30-39.9) 08/28/2021   History of anemia due to chronic kidney disease 08/28/2021   Diabetic foot ulcer ([place]) 08/14/2021   Hypomagnesemia 06/25/2021   Septic arthritis of elbow, left ([place]) 06/24/2021   Peripheral vascular disease ([place]) 04/24/2020   Pyogenic inflammation of bone ([facility])    Thrombophlebitis of superficial veins of right lower extremity    Difficult intravenous access    MRSA bacteremia    Diabetic foot infection ([place])    Infected wound 01/12/2020   Critical lower limb ischemia ([place]) 02/02/2019   Personal history of noncompliance with medical treatment, presenting hazards to health 10/07/2018   Critical limb ischemia with history of revascularization of same extremity ([place]) 07/27/2018   Mixed hyperlipidemia 02/19/2018   History of renal transplant 02/19/2018   Conductive hearing loss, bilateral 08/31/2017   Nuclear sclerotic cataract of right eye 10/07/2016   Pseudophakia of left eye 10/07/2016   Hypertension due to endocrine disorder 03/04/2016   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 ([place]) 03/04/2016   Renal failure 03/04/2016   Proliferative diabetic retinopathy with macular edema associated with type 2 diabetes mellitus ([place]) 03/04/2016   Immunosuppression ([place]) 12/10/2015   Nausea vomiting and diarrhea 12/10/2015   Type 2 diabetes mellitus ([place]) 12/10/2015   Essential hypertension, benign 12/10/2015   Pancytopenia ([place]) 12/10/2015   Encounter for screening colonoscopy 11/15/2014   GERD (gastroesophageal reflux disease) 11/15/2014    REFERRING DIAG: F57.322G (ICD-10-CM) - Below-knee amputation of right lower extremit   ONSET DATE: 04/10/2022 prosthesis delivery   THERAPY DIAG:  Unsteadiness on  feet  Other abnormalities of gait and mobility  Muscle weakness (generalized)  Wound of right lower extremity, initial encounter  Rationale for Evaluation and Treatment Rehabilitation  PERTINENT HISTORY: DM2, PVD, ESRD s/p renal transplant 03/09/2015 on immunosuppressive meds, HTN   PRECAUTIONS: None  SUBJECTIVE:  SUBJECTIVE STATEMENT:  He is wearing prosthesis daily 3-5 hours 2x/day with some limb tenderness.   PAIN:  Are you having pain? No  OBJECTIVE: (objective measures completed at initial evaluation unless otherwise dated) POSTURE: rounded shoulders, forward head, flexed trunk , and weight shift left   LOWER EXTREMITY ROM:   ROM P:passive  A:active Right eval Left eval  Hip flexion      Hip extension      Hip abduction      Hip adduction      Hip internal rotation      Hip external rotation      Knee flexion 100*    Knee extension -5*    Ankle dorsiflexion      Ankle plantarflexion      Ankle inversion      Ankle eversion       (Blank rows = not tested)   LOWER EXTREMITY MMT:   MMT Right eval Left eval  Hip flexion 3+/5    Hip extension 3-/5    Hip abduction 3-/5    Hip adduction      Hip internal rotation      Hip external rotation      Knee flexion 3-/5    Knee extension 3/5    Ankle dorsiflexion      Ankle plantarflexion      Ankle inversion      Ankle eversion      (Blank rows = not tested)   TRANSFERS: Sit to stand: 18" chair with armrests to RW with supervision.  Stand to sit: RW to 18" chair with armrests with supervision   PROSTHETIC GAIT WITH BKA PROSTHESIS: Gait pattern: step to pattern, decreased arm swing- Right, decreased step length- Right, decreased stance time- Left, decreased stride length, decreased hip/knee flexion- Right, circumduction- Right,  Right hip hike, knee flexed in stance- Right, antalgic, lateral hip instability, trunk flexed, and abducted- Right Distance walked: 2' with RW & 20' with cane Assistive device utilized: Single point cane, Walker - 2 wheeled, and BKA prosthesis Level of assistance: SBA with RW and Min A Gait velocity: 0.89 ft/sec Comments: excessive weight bearing on RW, partial weight on prosthesis   FUNCTIONAL TESTs:  Timed up and go (TUG): 39.25sec with cane & BKA prosthesis Berg Balance Scale: 24/56   [facility] PT Assessment - 04/15/22 1020                Standardized Balance Assessment    Standardized Balance Assessment Berg Balance Test;Timed Up and Go Test          Berg Balance Test    Sit to Stand Able to stand  independently using hands     Standing Unsupported Able to stand 2 minutes with supervision     Sitting with Back Unsupported but Feet Supported on Floor or Stool Able to sit safely and securely 2 minutes     Stand to Sit Controls descent by using hands     Transfers Able to transfer safely, definite need of hands     Standing Unsupported with Eyes Closed Unable to keep eyes closed 3 seconds but stays steady     Standing Unsupported with Feet Together Able to place feet together independently but unable to hold for 30 seconds     From Standing, Reach Forward with Outstretched Arm Can reach forward >5 cm safely (2")     From Standing Position, Pick up Object from Floor Unable to try/needs assist to keep balance  From Standing Position, Turn to Look Behind Over each Shoulder Needs supervision when turning     Turn 360 Degrees Needs assistance while turning     Standing Unsupported, Alternately Place Feet on Step/Stool Needs assistance to keep from falling or unable to try     Standing Unsupported, One Foot in [place] help to step but can hold 15 seconds     Standing on One Leg Tries to lift leg/unable to hold 3 seconds but remains standing independently     Total Score 24           Timed Up and Go Test    Normal TUG (seconds) 39.25   cane & TTA prosthesis                  CURRENT PROSTHETIC WEAR ASSESSMENT: Patient is dependent with: skin check, residual limb care, care of non-amputated limb, prosthetic cleaning, ply sock cleaning, correct ply sock adjustment, proper wear schedule/adjustment, and proper weight-bearing schedule/adjustment Donning prosthesis: Min A Doffing prosthesis: SBA Prosthetic wear tolerance: 3 hours, 1-2x/day, 5 of 5 days since delivery Prosthetic weight bearing tolerance: 5 minutes Edema: pitting. Residual limb condition: 54m scab wound on medial incision, dry skin, normal color & temperature, cylindrical shape Prosthetic description: silicon liner with pin lock suspension, total contact socket, hydraulic ankle / K3 foot K code/activity level with prosthetic use: Level 3       TODAY'S TREATMENT:                                                                                                                             DATE:  04/21/2022: Prosthetic Training with TTA prosthesis: PT instructed patient and wife with verbal cues on progressing wear time for prosthesis.  PT recommending 3 hours on 2 hours off for 3 sets per day.  Fluctuating time can cause skin integrity issues or limb pain.  Patient and wife verbalized understanding. PT gave you over you for adjusting ply socks however did not thoroughly go over today as patient did not bring his socks with them.  PT recommended bringing socks to next session which patient reports he will do. PT demo and verbal cues on sit to and from stand from chairs without armrest present.  Patient returned demonstration with verbal cues. PT demo and verbal cues on proper step width and step length for prosthetic gait.  Patient ambulated 100 feet x 2 with a rolling walker with tactile and verbal cues for carryover PT demo and verbal cues on negotiating curbs and ramps with rolling walker and prosthesis.   Patient returned demonstration with PT cues and supervision. PT Verbal cues on initiating cane gait using a tabletop touch with right upper extremity or railing support with right upper extremity.  Patient ambulated 3 laps around tabletop with supervision and verbal cues for carryover for proper prosthetic gait.  Patient ambulated with single rail support inside parallel bars with cane with supervision.  Neuromuscular reeducation:  PT demo verbal cues skilled noted below.  Patient returned demonstration using single rail and cane. Backward gait Sidestepping  04/15/2022   PATIENT EDUCATION: PATIENT EDUCATED ON FOLLOWING PROSTHETIC CARE: Education details:   Skin check, Residual limb care, Prosthetic cleaning, Correct ply sock adjustment, [place] donning, and Proper wear schedule/adjustment Prosthetic wear tolerance: 3 hours on, 2 hours off, 3x/day, 7 days/week Person educated: Patient Education method: Explanation, Demonstration, Tactile cues, and Verbal cues Education comprehension: verbalized understanding, returned demonstration, verbal cues required, tactile cues required, and needs further education   HOME EXERCISE PROGRAM:   ASSESSMENT:   CLINICAL IMPRESSION: Patient improved prosthetic gait with PT instruction to improve stance weight bearing directly over prosthesis. PT instructed in initial gait with cane using support RUE for safety.  Patient would benefit from skilled PT to improve function and safety with use of prosthesis.   OBJECTIVE IMPAIRMENTS: Abnormal gait, decreased activity tolerance, decreased balance, decreased endurance, decreased knowledge of condition, decreased knowledge of use of DME, decreased mobility, decreased ROM, decreased strength, increased edema, impaired flexibility, postural dysfunction, prosthetic dependency , obesity, and wound on residual limb .    ACTIVITY LIMITATIONS: carrying, lifting, bending, standing, squatting, stairs, transfers, locomotion  level, and prosthetic use   PARTICIPATION LIMITATIONS: meal prep, cleaning, laundry, driving, community activity, occupation, and yard work   PERSONAL FACTORS: Fitness, Time since onset of injury/illness/exacerbation, and 3+ comorbidities: see PMH  are also affecting patient's functional outcome.    REHAB POTENTIAL: Good   CLINICAL DECISION MAKING: Evolving/moderate complexity   EVALUATION COMPLEXITY: Moderate     GOALS: Goals reviewed with patient? Yes   SHORT TERM GOALS: Target date: 05/16/2022   Patient donnes prosthesis modified independent & verbalizes proper cleaning. Baseline: SEE OBJECTIVE DATA Goal status: INITIAL 2.  Patient tolerates prosthesis >12 hrs total /day without skin issues or limb pain after standing. Baseline: SEE OBJECTIVE DATA Goal status: INITIAL   3. Berg Balance >36/56 Baseline: SEE OBJECTIVE DATA Goal status: INITIAL   4. Patient ambulates 200' with cane & prosthesis with supervision. Baseline: SEE OBJECTIVE DATA Goal status: INITIAL   5. Patient negotiates ramps & curbs with cane & prosthesis with minA. Baseline: SEE OBJECTIVE DATA Goal status: INITIAL   LONG TERM GOALS: Target date: 07/31/2021   Patient demonstrates & verbalized understanding of prosthetic care to enable safe utilization of prosthesis. Baseline: SEE OBJECTIVE DATA Goal status: INITIAL   Patient tolerates prosthesis wear >90% of awake hours without skin or limb pain issues. Baseline: SEE OBJECTIVE DATA Goal status: INITIAL   Functional Gait Assessment >22/30 to indicate lower fall risk Baseline: SEE OBJECTIVE DATA Goal status: INITIAL   Patient ambulates >500' with prosthesis only independently Baseline: SEE OBJECTIVE DATA Goal status: INITIAL   Patient negotiates ramps, curbs & stairs with single rail with prosthesis only independently. Baseline: SEE OBJECTIVE DATA Goal status: INITIAL   Patient demonstrates & verbalizes lifting, carrying, pushing, pulling with  prosthesis only safely. Baseline: SEE OBJECTIVE DATA Goal status: INITIAL     PLAN:   PT FREQUENCY: 3x/week for 5 weeks, then 1-2 x/wk for 10 weeks   PT DURATION: other: 15 weeks   PLANNED INTERVENTIONS: Therapeutic exercises, Therapeutic activity, Neuromuscular re-education, Balance training, Gait training, Patient/Family education, Self Care, Stair training, Vestibular training, Prosthetic training, DME instructions, and physical performance testing   PLAN FOR NEXT SESSION:  instruct in adjusting ply socks with too many, too few & correct ply fit, HEP at sink, continue prosthetic gait with cane   Jamey Reas, PT,  DPT 04/21/2022, 4:28 PM

## 2022-04-22 ENCOUNTER — Ambulatory Visit (INDEPENDENT_AMBULATORY_CARE_PROVIDER_SITE_OTHER): Payer: 59 | Admitting: Physical Therapy

## 2022-04-22 ENCOUNTER — Encounter: Payer: Self-pay | Admitting: Physical Therapy

## 2022-04-22 DIAGNOSIS — R2681 Unsteadiness on feet: Secondary | ICD-10-CM

## 2022-04-22 DIAGNOSIS — S81801A Unspecified open wound, right lower leg, initial encounter: Secondary | ICD-10-CM | POA: Diagnosis not present

## 2022-04-22 DIAGNOSIS — M6281 Muscle weakness (generalized): Secondary | ICD-10-CM | POA: Diagnosis not present

## 2022-04-22 DIAGNOSIS — R2689 Other abnormalities of gait and mobility: Secondary | ICD-10-CM | POA: Diagnosis not present

## 2022-04-22 NOTE — Therapy (Signed)
OUTPATIENT PHYSICAL THERAPY TREATMENT NOTE   Patient Name: Raymond [name] MRN: [id number] DOB:01/18/1961, 61 y.o., male Today's Date: 04/22/2022  PCP: [name], FNP  REFERRING PROVIDER: Newt Minion, MD   END OF SESSION:   PT End of Session - 04/22/22 0844     Visit Number 3    Number of Visits 25    Date for PT Re-Evaluation 08/01/22    Authorization Type [employer]    Authorization Time Period NO COPAY, 35 PT/OT VISITS Calendar Year Benefit Date 05/26/2021 - 05/25/2022    Authorization - Number of Visits 35    Progress Note Due on Visit 10    PT Start Time 0845    PT Stop Time 0930    PT Time Calculation (min) 45 min    Equipment Utilized During Treatment Gait belt    Activity Tolerance Patient tolerated treatment well    Behavior During Therapy WFL for tasks assessed/performed             Past Medical History:  Diagnosis Date   Diabetes ([place])    Diabetes mellitus without complication ([place])    type 2   ESRD (end stage renal disease) ([place])    03/09/2015- patient had a kidney transplant   Hypertension    Peripheral vascular disease ([place])    Renal disorder    Renal insufficiency    Past Surgical History:  Procedure Laterality Date   ABDOMINAL AORTOGRAM W/LOWER EXTREMITY N/A 09/11/2021   Procedure: ABDOMINAL AORTOGRAM W/LOWER EXTREMITY;  Surgeon: Wellington Hampshire, MD;  Location: [facility];  Service: Cardiovascular;  Laterality: N/A;   AMPUTATION Right 09/25/2021   Procedure: RIGHT TRANSMETATARSAL AMPUTATION AND APPLY TISSUE GRAFT;  Surgeon: Newt Minion, MD;  Location: [facility];  Service: Orthopedics;  Laterality: Right;   AMPUTATION Right 11/16/2021   Procedure: RIGHT AMPUTATION BELOW KNEE WITH WOUND VAC APPLICATION;  Surgeon: Newt Minion, MD;  Location: [facility] [place];  Service: Orthopedics;  Laterality: Right;   BONE BIOPSY Right 03/06/2021   Procedure: BONE BIOPSY;  Surgeon: Trula Slade, DPM;  Location: [facility];  Service: Podiatry;  Laterality:  Right;   CENTRAL VENOUS CATHETER INSERTION Right 01/20/2020   Procedure: INSERTION CENTRAL LINE ADULT; Tunneled central line;  Surgeon: Virl Cagey, MD;  Location: [facility];  Service: General;  Laterality: Right;   COLONOSCOPY N/A 12/05/2014   HCW:CBJSEGBT external and internal hemorrhoid/mild diverticulosis/11 polyps removed   ESOPHAGOGASTRODUODENOSCOPY N/A 12/05/2014   DVV:OHYW duodenitis   GRAFT APPLICATION Right 73/71/0626   Procedure: GRAFT APPLICATION;  Surgeon: Trula Slade, DPM;  Location: [facility];  Service: Podiatry;  Laterality: Right;   INCISION AND DRAINAGE OF WOUND Right 08/15/2021   Procedure: IRRIGATION AND DEBRIDEMENT WOUND;  Surgeon: Landis Martins, DPM;  Location: [facility];  Service: Podiatry;  Laterality: Right;  need to make card not  a irrigation and debridement card   IR FLUORO GUIDE CV LINE RIGHT  03/01/2019   IR PERC TUN PERIT CATH WO PORT S&I /IMAG  06/27/2021   IR REMOVAL TUN CV CATH W/O FL  05/10/2019   IR REMOVAL TUN CV CATH W/O FL  02/29/2020   IR REMOVAL TUN CV CATH W/O FL  07/19/2021   IR US GUIDE VASC ACCESS RIGHT  03/01/2019   IR US GUIDE VASC ACCESS RIGHT  06/27/2021   IRRIGATION AND DEBRIDEMENT ELBOW Left 06/24/2021   Procedure: IRRIGATION AND DEBRIDEMENT ELBOW;  Surgeon: Lovell Sheehan, MD;  Location: [facility];  Service: Orthopedics;  Laterality: Left;   KIDNEY TRANSPLANT  03/27/2015   Penile Pump Insertion     PERIPHERAL VASCULAR ATHERECTOMY  09/11/2021   Procedure: PERIPHERAL VASCULAR ATHERECTOMY;  Surgeon: Wellington Hampshire, MD;  Location: [facility];  Service: Cardiovascular;;  Shockwave Lithotripsy   TEE WITHOUT CARDIOVERSION N/A 01/17/2020   Procedure: TRANSESOPHAGEAL ECHOCARDIOGRAM (TEE) WITH PROPOFOL;  Surgeon: Arnoldo Lenis, MD;  [facility];  Service: Endoscopy;  Laterality: N/A;   WOUND DEBRIDEMENT Right 03/06/2021   Procedure: DEBRIDEMENT WOUND;  Surgeon: Trula Slade, DPM;  Location: [facility];  Service:  Podiatry;  Laterality: Right;   Patient Active Problem List   Diagnosis Date Noted   Cutaneous abscess of right foot    Hyperkalemia 08/28/2021   Obesity (BMI 30-39.9) 08/28/2021   History of anemia due to chronic kidney disease 08/28/2021   Diabetic foot ulcer ([place]) 08/14/2021   Hypomagnesemia 06/25/2021   Septic arthritis of elbow, left ([place]) 06/24/2021   Peripheral vascular disease ([place]) 04/24/2020   Pyogenic inflammation of bone ([facility])    Thrombophlebitis of superficial veins of right lower extremity    Difficult intravenous access    MRSA bacteremia    Diabetic foot infection ([place])    Infected wound 01/12/2020   Critical lower limb ischemia ([place]) 02/02/2019   Personal history of noncompliance with medical treatment, presenting hazards to health 10/07/2018   Critical limb ischemia with history of revascularization of same extremity ([place]) 07/27/2018   Mixed hyperlipidemia 02/19/2018   History of renal transplant 02/19/2018   Conductive hearing loss, bilateral 08/31/2017   Nuclear sclerotic cataract of right eye 10/07/2016   Pseudophakia of left eye 10/07/2016   Hypertension due to endocrine disorder 03/04/2016   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 ([place]) 03/04/2016   Renal failure 03/04/2016   Proliferative diabetic retinopathy with macular edema associated with type 2 diabetes mellitus ([place]) 03/04/2016   Immunosuppression ([place]) 12/10/2015   Nausea vomiting and diarrhea 12/10/2015   Type 2 diabetes mellitus ([place]) 12/10/2015   Essential hypertension, benign 12/10/2015   Pancytopenia ([place]) 12/10/2015   Encounter for screening colonoscopy 11/15/2014   GERD (gastroesophageal reflux disease) 11/15/2014    REFERRING DIAG: G26.948N (ICD-10-CM) - Below-knee amputation of right lower extremit   ONSET DATE: 04/10/2022 prosthesis delivery   THERAPY DIAG:  Unsteadiness on feet  Other abnormalities of gait and  mobility  Muscle weakness (generalized)  Wound of right lower extremity, initial encounter  Rationale for Evaluation and Treatment Rehabilitation  PERTINENT HISTORY: DM2, PVD, ESRD s/p renal transplant 03/09/2015 on immunosuppressive meds, HTN   PRECAUTIONS: None  SUBJECTIVE:  SUBJECTIVE STATEMENT:  He has been taking his fluid pill because holding fluid.   PAIN:  Are you having pain? No  OBJECTIVE: (objective measures completed at initial evaluation unless otherwise dated) POSTURE: rounded shoulders, forward head, flexed trunk , and weight shift left   LOWER EXTREMITY ROM:   ROM P:passive  A:active Right eval Left eval  Hip flexion      Hip extension      Hip abduction      Hip adduction      Hip internal rotation      Hip external rotation      Knee flexion 100*    Knee extension -5*    Ankle dorsiflexion      Ankle plantarflexion      Ankle inversion      Ankle eversion       (Blank rows = not tested)   LOWER EXTREMITY MMT:   MMT Right eval Left eval  Hip flexion 3+/5    Hip extension 3-/5    Hip abduction 3-/5    Hip adduction      Hip internal rotation      Hip external rotation      Knee flexion 3-/5    Knee extension 3/5    Ankle dorsiflexion      Ankle plantarflexion      Ankle inversion      Ankle eversion      (Blank rows = not tested)   TRANSFERS: Sit to stand: 18" chair with armrests to RW with supervision.  Stand to sit: RW to 18" chair with armrests with supervision   PROSTHETIC GAIT WITH BKA PROSTHESIS: Gait pattern: step to pattern, decreased arm swing- Right, decreased step length- Right, decreased stance time- Left, decreased stride length, decreased hip/knee flexion- Right, circumduction- Right, Right hip hike, knee flexed in stance- Right, antalgic,  lateral hip instability, trunk flexed, and abducted- Right Distance walked: 83' with RW & 20' with cane Assistive device utilized: Single point cane, Walker - 2 wheeled, and BKA prosthesis Level of assistance: SBA with RW and Min A Gait velocity: 0.89 ft/sec Comments: excessive weight bearing on RW, partial weight on prosthesis   FUNCTIONAL TESTs:  Timed up and go (TUG): 39.25sec with cane & BKA prosthesis Berg Balance Scale: 24/56     CURRENT PROSTHETIC WEAR ASSESSMENT: Patient is dependent with: skin check, residual limb care, care of non-amputated limb, prosthetic cleaning, ply sock cleaning, correct ply sock adjustment, proper wear schedule/adjustment, and proper weight-bearing schedule/adjustment Donning prosthesis: Min A Doffing prosthesis: SBA Prosthetic wear tolerance: 3 hours, 1-2x/day, 5 of 5 days since delivery Prosthetic weight bearing tolerance: 5 minutes Edema: pitting. Residual limb condition: 71m scab wound on medial incision, dry skin, normal color & temperature, cylindrical shape Prosthetic description: silicon liner with pin lock suspension, total contact socket, hydraulic ankle / K3 foot K code/activity level with prosthetic use: Level 3       TODAY'S TREATMENT:  DATE:  04/22/2022: Prosthetic Training with TTA prosthesis: Patient's residual limb has no openings.  PT removed a dry scab from medial incision. PT instructed to increase wear time to 4 hours on 2 hours off initiating first wear upon arising.  Patient and wife verbalized understanding. PT educated patient and wife and increased risk of falls when prosthesis is off.  PT recommended using wheelchair near bed at night and walker near chair when his prosthesis is off during the day.  Patient and wife verbalized understanding. PT reviewed proper donning technique including donning socks.   Patient verbalized return demonstration understanding. PT instructed patient and wife and adjusting ply socks.  Patient donne prosthesis and ambulated with rolling walker with too few socks, too many socks and correct ply socks.  Patient verbalized increased understanding of how to adjust socks including need to modify during the day as he pumps fluid out of the limb with gait. PT instructed patient in increasing activity level with short walk (room to room) increasing frequency during day, long walk (max tolerable distance) 1-2 times per day and medium distance (between short and long distance) 4+ times per day.  Patient verbalized understanding including rationale for increasing activity tolerance.   04/21/2022: Prosthetic Training with TTA prosthesis: PT instructed patient and wife with verbal cues on progressing wear time for prosthesis.  PT recommending 3 hours on 2 hours off for 3 sets per day.  Fluctuating time can cause skin integrity issues or limb pain.  Patient and wife verbalized understanding. PT gave you over you for adjusting ply socks however did not thoroughly go over today as patient did not bring his socks with them.  PT recommended bringing socks to next session which patient reports he will do. PT demo and verbal cues on sit to and from stand from chairs without armrest present.  Patient returned demonstration with verbal cues. PT demo and verbal cues on proper step width and step length for prosthetic gait.  Patient ambulated 100 feet x 2 with a rolling walker with tactile and verbal cues for carryover PT demo and verbal cues on negotiating curbs and ramps with rolling walker and prosthesis.  Patient returned demonstration with PT cues and supervision. PT Verbal cues on initiating cane gait using a tabletop touch with right upper extremity or railing support with right upper extremity.  Patient ambulated 3 laps around tabletop with supervision and verbal cues for carryover for  proper prosthetic gait.  Patient ambulated with single rail support inside parallel bars with cane with supervision.  Neuromuscular reeducation: PT demo verbal cues skilled noted below.  Patient returned demonstration using single rail and cane. Backward gait Sidestepping  04/15/2022   PATIENT EDUCATION: PATIENT EDUCATED ON FOLLOWING PROSTHETIC CARE: Education details:   Skin check, Residual limb care, Prosthetic cleaning, Correct ply sock adjustment, [place] donning, and Proper wear schedule/adjustment Prosthetic wear tolerance: 3 hours on, 2 hours off, 3x/day, 7 days/week Person educated: Patient Education method: Explanation, Demonstration, Tactile cues, and Verbal cues Education comprehension: verbalized understanding, returned demonstration, verbal cues required, tactile cues required, and needs further education   HOME EXERCISE PROGRAM:   ASSESSMENT:   CLINICAL IMPRESSION: Patient appears to have a better understanding of how to adjust ply socks to maximize fit with fluid changes and residual limb.  Patient also has an understanding of how to progress activity tolerance with gait.  Patient would benefit from skilled PT to improve function and safety with use of prosthesis.   OBJECTIVE IMPAIRMENTS: Abnormal gait, decreased  activity tolerance, decreased balance, decreased endurance, decreased knowledge of condition, decreased knowledge of use of DME, decreased mobility, decreased ROM, decreased strength, increased edema, impaired flexibility, postural dysfunction, prosthetic dependency , obesity, and wound on residual limb .    ACTIVITY LIMITATIONS: carrying, lifting, bending, standing, squatting, stairs, transfers, locomotion level, and prosthetic use   PARTICIPATION LIMITATIONS: meal prep, cleaning, laundry, driving, community activity, occupation, and yard work   PERSONAL FACTORS: Fitness, Time since onset of injury/illness/exacerbation, and 3+ comorbidities: see PMH  are also  affecting patient's functional outcome.    REHAB POTENTIAL: Good   CLINICAL DECISION MAKING: Evolving/moderate complexity   EVALUATION COMPLEXITY: Moderate     GOALS: Goals reviewed with patient? Yes   SHORT TERM GOALS: Target date: 05/16/2022   Patient donnes prosthesis modified independent & verbalizes proper cleaning. Baseline: SEE OBJECTIVE DATA Goal status: INITIAL 2.  Patient tolerates prosthesis >12 hrs total /day without skin issues or limb pain after standing. Baseline: SEE OBJECTIVE DATA Goal status: INITIAL   3. Berg Balance >36/56 Baseline: SEE OBJECTIVE DATA Goal status: INITIAL   4. Patient ambulates 200' with cane & prosthesis with supervision. Baseline: SEE OBJECTIVE DATA Goal status: INITIAL   5. Patient negotiates ramps & curbs with cane & prosthesis with minA. Baseline: SEE OBJECTIVE DATA Goal status: INITIAL   LONG TERM GOALS: Target date: 07/31/2021   Patient demonstrates & verbalized understanding of prosthetic care to enable safe utilization of prosthesis. Baseline: SEE OBJECTIVE DATA Goal status: INITIAL   Patient tolerates prosthesis wear >90% of awake hours without skin or limb pain issues. Baseline: SEE OBJECTIVE DATA Goal status: INITIAL   Functional Gait Assessment >22/30 to indicate lower fall risk Baseline: SEE OBJECTIVE DATA Goal status: INITIAL   Patient ambulates >500' with prosthesis only independently Baseline: SEE OBJECTIVE DATA Goal status: INITIAL   Patient negotiates ramps, curbs & stairs with single rail with prosthesis only independently. Baseline: SEE OBJECTIVE DATA Goal status: INITIAL   Patient demonstrates & verbalizes lifting, carrying, pushing, pulling with prosthesis only safely. Baseline: SEE OBJECTIVE DATA Goal status: INITIAL     PLAN:   PT FREQUENCY: 3x/week for 5 weeks, then 1-2 x/wk for 10 weeks   PT DURATION: other: 15 weeks   PLANNED INTERVENTIONS: Therapeutic exercises, Therapeutic activity,  Neuromuscular re-education, Balance training, Gait training, Patient/Family education, Self Care, Stair training, Vestibular training, Prosthetic training, DME instructions, and physical performance testing   PLAN FOR NEXT SESSION:  HEP at sink, continue prosthetic gait with cane   Jamey Reas, PT, DPT 04/22/2022, 12:07 PM

## 2022-04-23 ENCOUNTER — Other Ambulatory Visit: Payer: Self-pay | Admitting: Cardiovascular Disease

## 2022-04-23 DIAGNOSIS — I739 Peripheral vascular disease, unspecified: Secondary | ICD-10-CM

## 2022-04-24 ENCOUNTER — Encounter: Payer: Self-pay | Admitting: Physical Therapy

## 2022-04-24 ENCOUNTER — Ambulatory Visit (INDEPENDENT_AMBULATORY_CARE_PROVIDER_SITE_OTHER): Payer: 59 | Admitting: Physical Therapy

## 2022-04-24 DIAGNOSIS — R2689 Other abnormalities of gait and mobility: Secondary | ICD-10-CM | POA: Diagnosis not present

## 2022-04-24 DIAGNOSIS — M6281 Muscle weakness (generalized): Secondary | ICD-10-CM

## 2022-04-24 DIAGNOSIS — R2681 Unsteadiness on feet: Secondary | ICD-10-CM

## 2022-04-24 NOTE — Therapy (Addendum)
OUTPATIENT PHYSICAL THERAPY TREATMENT NOTE   Patient Name: Raymond [name] MRN: [id number] DOB:November 15, 1960, 61 y.o., male Today's Date: 04/24/2022  PCP: [name], FNP  REFERRING PROVIDER: Newt Minion, MD   END OF SESSION:   PT End of Session - 04/24/22 0851     Visit Number 4    Number of Visits 25    Date for PT Re-Evaluation 08/01/22    Authorization Type [employer]    Authorization Time Period NO COPAY, 35 PT/OT VISITS Calendar Year Benefit Date 05/26/2021 - 05/25/2022    Authorization - Visit Number 4    Authorization - Number of Visits 35    Progress Note Due on Visit 10    PT Start Time 0849    PT Stop Time 0930    PT Time Calculation (min) 41 min    Equipment Utilized During Treatment Gait belt    Activity Tolerance Patient tolerated treatment well    Behavior During Therapy WFL for tasks assessed/performed             Past Medical History:  Diagnosis Date   Diabetes ([place])    Diabetes mellitus without complication ([place])    type 2   ESRD (end stage renal disease) ([place])    03/09/2015- patient had a kidney transplant   Hypertension    Peripheral vascular disease ([place])    Renal disorder    Renal insufficiency    Past Surgical History:  Procedure Laterality Date   ABDOMINAL AORTOGRAM W/LOWER EXTREMITY N/A 09/11/2021   Procedure: ABDOMINAL AORTOGRAM W/LOWER EXTREMITY;  Surgeon: Wellington Hampshire, MD;  Location: [facility];  Service: Cardiovascular;  Laterality: N/A;   AMPUTATION Right 09/25/2021   Procedure: RIGHT TRANSMETATARSAL AMPUTATION AND APPLY TISSUE GRAFT;  Surgeon: Newt Minion, MD;  Location: [facility];  Service: Orthopedics;  Laterality: Right;   AMPUTATION Right 11/16/2021   Procedure: RIGHT AMPUTATION BELOW KNEE WITH WOUND VAC APPLICATION;  Surgeon: Newt Minion, MD;  Location: [facility];  Service: Orthopedics;  Laterality: Right;   BONE BIOPSY Right 03/06/2021   Procedure: BONE BIOPSY;  Surgeon: Trula Slade, DPM;  [facility]: [facility];  Service: Podiatry;  Laterality: Right;   CENTRAL VENOUS CATHETER INSERTION Right 01/20/2020   Procedure: INSERTION CENTRAL LINE ADULT; Tunneled central line;  Surgeon: Virl Cagey, MD;  Location: [facility];  Service: General;  Laterality: Right;   COLONOSCOPY N/A 12/05/2014   HKV:[id number] external and internal hemorrhoid/mild diverticulosis/11 polyps removed   ESOPHAGOGASTRODUODENOSCOPY N/A 12/05/2014   VFI:EPPI duodenitis   GRAFT APPLICATION Right 95/18/8416   Procedure: GRAFT APPLICATION;  Surgeon: Trula Slade, DPM;  Location: [facility];  Service: Podiatry;  Laterality: Right;   INCISION AND DRAINAGE OF WOUND Right 08/15/2021   Procedure: IRRIGATION AND DEBRIDEMENT WOUND;  Surgeon: Landis Martins, DPM;  Location: [facility];  Service: Podiatry;  Laterality: Right;  need to make card not  a irrigation and debridement card   IR FLUORO GUIDE CV LINE RIGHT  03/01/2019   IR PERC TUN PERIT CATH WO PORT S&I /IMAG  06/27/2021   IR REMOVAL TUN CV CATH W/O FL  05/10/2019   IR REMOVAL TUN CV CATH W/O FL  02/29/2020   IR REMOVAL TUN CV CATH W/O FL  07/19/2021   IR US GUIDE VASC ACCESS RIGHT  03/01/2019   IR US GUIDE VASC ACCESS RIGHT  06/27/2021   IRRIGATION AND DEBRIDEMENT ELBOW Left 06/24/2021   Procedure: IRRIGATION AND DEBRIDEMENT ELBOW;  Surgeon: Lovell Sheehan,  MD;  Location: [facility];  Service: Orthopedics;  Laterality: Left;   KIDNEY TRANSPLANT  03/27/2015   Penile Pump Insertion     PERIPHERAL VASCULAR ATHERECTOMY  09/11/2021   Procedure: PERIPHERAL VASCULAR ATHERECTOMY;  Surgeon: Wellington Hampshire, MD;  Location: [facility];  Service: Cardiovascular;;  Sh[facility]wave Lithotripsy   TEE WITHOUT CARDIOVERSION N/A 01/17/2020   Procedure: TRANSESOPHAGEAL ECHOCARDIOGRAM (TEE) WITH PROPOFOL;  Surgeon: Arnoldo Lenis, MD;  [facility];  Service: Endoscopy;  Laterality: N/A;   WOUND DEBRIDEMENT Right 03/06/2021   Procedure: DEBRIDEMENT WOUND;  Surgeon: Trula Slade,  DPM;  [facility]: [facility];  Service: Podiatry;  Laterality: Right;   Patient Active Problem List   Diagnosis Date Noted   Cutaneous abscess of right foot    Hyperkalemia 08/28/2021   Obesity (BMI 30-39.9) 08/28/2021   History of anemia due to chronic kidney disease 08/28/2021   Diabetic foot ulcer ([place]) 08/14/2021   Hypomagnesemia 06/25/2021   Septic arthritis of elbow, left ([place]) 06/24/2021   Peripheral vascular disease ([place]) 04/24/2020   Pyogenic inflammation of bone ([facility])    Thrombophlebitis of superficial veins of right lower extremity    Difficult intravenous access    MRSA bacteremia    Diabetic foot infection ([place])    Infected wound 01/12/2020   Critical lower limb ischemia ([place]) 02/02/2019   Personal history of noncompliance with medical treatment, presenting hazards to health 10/07/2018   Critical limb ischemia with history of revascularization of same extremity ([place]) 07/27/2018   Mixed hyperlipidemia 02/19/2018   History of renal transplant 02/19/2018   Conductive hearing loss, bilateral 08/31/2017   Nuclear sclerotic cataract of right eye 10/07/2016   Pseudophakia of left eye 10/07/2016   Hypertension due to endocrine disorder 03/04/2016   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 ([place]) 03/04/2016   Renal failure 03/04/2016   Proliferative diabetic retinopathy with macular edema associated with type 2 diabetes mellitus ([place]) 03/04/2016   Immunosuppression ([place]) 12/10/2015   Nausea vomiting and diarrhea 12/10/2015   Type 2 diabetes mellitus ([place]) 12/10/2015   Essential hypertension, benign 12/10/2015   Pancytopenia ([place]) 12/10/2015   Encounter for screening colonoscopy 11/15/2014   GERD (gastroesophageal reflux disease) 11/15/2014    REFERRING DIAG: R91.638G (ICD-10-CM) - Below-knee amputation of right lower extremit   ONSET DATE: 04/10/2022 prosthesis delivery   THERAPY DIAG:  Unsteadiness on  feet  Other abnormalities of gait and mobility  Muscle weakness (generalized)  Rationale for Evaluation and Treatment Rehabilitation  PERTINENT HISTORY: DM2, PVD, ESRD s/p renal transplant 03/09/2015 on immunosuppressive meds, HTN   PRECAUTIONS: None  SUBJECTIVE:  SUBJECTIVE STATEMENT:  He wore prosthesis 5hrs on 2x/day. His limb was sore. He has been adjusting ply socks.   PAIN:  Are you having pain? No  OBJECTIVE: (objective measures completed at initial evaluation unless otherwise dated) POSTURE: rounded shoulders, forward head, flexed trunk , and weight shift left   LOWER EXTREMITY ROM:   ROM P:passive  A:active Right eval Left eval  Hip flexion      Hip extension      Hip abduction      Hip adduction      Hip internal rotation      Hip external rotation      Knee flexion 100*    Knee extension -5*    Ankle dorsiflexion      Ankle plantarflexion      Ankle inversion      Ankle eversion       (Blank rows = not tested)   LOWER EXTREMITY MMT:   MMT Right eval Left eval  Hip flexion 3+/5    Hip extension 3-/5    Hip abduction 3-/5    Hip adduction      Hip internal rotation      Hip external rotation      Knee flexion 3-/5    Knee extension 3/5    Ankle dorsiflexion      Ankle plantarflexion      Ankle inversion      Ankle eversion      (Blank rows = not tested)   TRANSFERS: Sit to stand: 18" chair with armrests to RW with supervision.  Stand to sit: RW to 18" chair with armrests with supervision   PROSTHETIC GAIT WITH BKA PROSTHESIS: Gait pattern: step to pattern, decreased arm swing- Right, decreased step length- Right, decreased stance time- Left, decreased stride length, decreased hip/knee flexion- Right, circumduction- Right, Right hip hike, knee flexed in stance-  Right, antalgic, lateral hip instability, trunk flexed, and abducted- Right Distance walked: 43' with RW & 20' with cane Assistive device utilized: Single point cane, Walker - 2 wheeled, and BKA prosthesis Level of assistance: SBA with RW and Min A Gait velocity: 0.89 ft/sec Comments: excessive weight bearing on RW, partial weight on prosthesis   FUNCTIONAL TESTs:  Timed up and go (TUG): 39.25sec with cane & BKA prosthesis Berg Balance Scale: 24/56     CURRENT PROSTHETIC WEAR ASSESSMENT: Patient is dependent with: skin check, residual limb care, care of non-amputated limb, prosthetic cleaning, ply sock cleaning, correct ply sock adjustment, proper wear schedule/adjustment, and proper weight-bearing schedule/adjustment Donning prosthesis: Min A Doffing prosthesis: SBA Prosthetic wear tolerance: 3 hours, 1-2x/day, 5 of 5 days since delivery Prosthetic weight bearing tolerance: 5 minutes Edema: pitting. Residual limb condition: 69m scab wound on medial incision, dry skin, normal color & temperature, cylindrical shape Prosthetic description: silicon liner with pin lock suspension, total contact socket, hydraulic ankle / K3 foot K code/activity level with prosthetic use: Level 3       TODAY'S TREATMENT:  DATE:  04/24/2022: Prosthetic Training with TTA prosthesis: PT reviewed wear consistently to limit limb pain. PT continue to recommended wear time 4 hours on 2 hours off initiating first wear upon arising.  Patient and wife verbalized understanding. End of session: PT demo and verbal cues on prosthetic gait with cane LUE and counter RUE -carryover of anterior posterior weight shift from initial contact her terminal stance and weight shift right over her prosthesis in stance.  Walking backwards and sideways with cane and counter support.  Neuromuscular Re-ed: HEP at  sink working on proprioception with limb, standing balance & standing tolerance. PT demo & verbal cues with HO, pt verbalized & return demo understanding.  Do each exercise 1-2  times per day Do each exercise 5-10 repetitions Hold each exercise for 2 seconds to feel your location  AT [place].  Try to find this position when standing still for activities.   USE TAPE ON FLOOR TO MARK THE MIDLINE POSITION which is even with middle of sink.  You also should try to feel with your limb pressure in socket.  You are trying to feel with limb what you used to feel with the bottom of your foot.  Side to Side Shift: Moving your hips only (not shoulders): move weight onto your left leg, HOLD/FEEL pressure in socket.  Move back to equal weight on each leg, HOLD/FEEL pressure in socket. Move weight onto your right leg, HOLD/FEEL pressure in socket. Move back to equal weight on each leg, HOLD/FEEL pressure in socket. Repeat.  Start with both hands on sink, progress to hand on prosthetic side only, then no hands.  Front to Back Shift: Moving your hips only (not shoulders): move your weight forward onto your toes, HOLD/FEEL pressure in socket. Move your weight back to equal Flat Foot on both legs, HOLD/FEEL  pressure in socket. Move your weight back onto your heels, HOLD/FEEL  pressure in socket. Move your weight back to equal on both legs, HOLD/FEEL  pressure in socket. Repeat.  Start with both hands on sink, progress to hand on prosthetic side only, then no hands.  Moving Cones / Cups: With equal weight on each leg: Hold on with one hand the first time, then progress to no hand supports. Move cups from one side of sink to the other. Place cups ~2" out of your reach, progress to 10" beyond reach.  Place one hand in middle of sink and reach with other hand. Do both arms.  Then hover one hand and move cups with other hand.  Overhead/Upward Reaching:  alternated reaching up to top cabinets or ceiling if no cabinets present. Keep equal weight on each leg. Start with one hand support on counter while other hand reaches and progress to no hand support with reaching.  ace one hand in middle of sink and reach with other hand. Do both arms.  Then hover one hand and move cups with other hand.  5.   Looking Over Shoulders: With equal weight on each leg: alternate turning to look over your shoulders with one hand support on counter as needed.  Start with head motions only to look in front of shoulder, then even with shoulder and progress to looking behind you. To look to side, move head /eyes, then shoulder on side looking pulls back, shift more weight to side looking and pull hip back. Place one hand in middle of sink and let go with  other hand so your shoulder can pull back. Switch hands to look other way.   Then hover one hand and look over shoulder. If looking right, use left hand at sink. If looking left, use right hand at sink. 6.  Stepping with leg that is not amputated:  Move items under cabinet out of your way. Shift your hips/pelvis so weight on prosthesis. Tighten muscles in hip on prosthetic side.  SLOWLY step other leg so front of foot is in cabinet. Then step back to floor.    04/22/2022: Prosthetic Training with TTA prosthesis: Patient's residual limb has no openings.  PT removed a dry scab from medial incision. PT instructed to increase wear time to 4 hours on 2 hours off initiating first wear upon arising.  Patient and wife verbalized understanding. PT educated patient and wife and increased risk of falls when prosthesis is off.  PT recommended using wheelchair near bed at night and walker near chair when his prosthesis is off during the day.  Patient and wife verbalized understanding. PT reviewed proper donning technique including donning socks.  Patient verbalized return demonstration understanding. PT instructed patient and wife and adjusting  ply socks.  Patient donne prosthesis and ambulated with rolling walker with too few socks, too many socks and correct ply socks.  Patient verbalized increased understanding of how to adjust socks including need to modify during the day as he pumps fluid out of the limb with gait. PT instructed patient in increasing activity level with short walk (room to room) increasing frequency during day, long walk (max tolerable distance) 1-2 times per day and medium distance (between short and long distance) 4+ times per day.  Patient verbalized understanding including rationale for increasing activity tolerance.   04/21/2022: Prosthetic Training with TTA prosthesis: PT instructed patient and wife with verbal cues on progressing wear time for prosthesis.  PT recommending 3 hours on 2 hours off for 3 sets per day.  Fluctuating time can cause skin integrity issues or limb pain.  Patient and wife verbalized understanding. PT gave you over you for adjusting ply socks however did not thoroughly go over today as patient did not bring his socks with them.  PT recommended bringing socks to next session which patient reports he will do. PT demo and verbal cues on sit to and from stand from chairs without armrest present.  Patient returned demonstration with verbal cues. PT demo and verbal cues on proper step width and step length for prosthetic gait.  Patient ambulated 100 feet x 2 with a rolling walker with tactile and verbal cues for carryover PT demo and verbal cues on negotiating curbs and ramps with rolling walker and prosthesis.  Patient returned demonstration with PT cues and supervision. PT Verbal cues on initiating cane gait using a tabletop touch with right upper extremity or railing support with right upper extremity.  Patient ambulated 3 laps around tabletop with supervision and verbal cues for carryover for proper prosthetic gait.  Patient ambulated with single rail support inside parallel bars with cane with  supervision.  Neuromuscular reeducation: PT demo verbal cues skilled noted below.  Patient returned demonstration using single rail and cane. Backward gait Sidestepping   HOME EXERCISE PROGRAM:   ASSESSMENT:   CLINICAL IMPRESSION: Patient appears to understand home exercise program including starting to use residual limb for proprioception of right lower extremity.  Patient would benefit from skilled PT to improve function and safety with use of prosthesis.   OBJECTIVE IMPAIRMENTS: Abnormal gait, decreased  activity tolerance, decreased balance, decreased endurance, decreased knowledge of condition, decreased knowledge of use of DME, decreased mobility, decreased ROM, decreased strength, increased edema, impaired flexibility, postural dysfunction, prosthetic dependency , obesity, and wound on residual limb .    ACTIVITY LIMITATIONS: carrying, lifting, bending, standing, squatting, stairs, transfers, locomotion level, and prosthetic use   PARTICIPATION LIMITATIONS: meal prep, cleaning, laundry, driving, community activity, occupation, and yard work   PERSONAL FACTORS: Fitness, Time since onset of injury/illness/exacerbation, and 3+ comorbidities: see PMH  are also affecting patient's functional outcome.    REHAB POTENTIAL: Good   CLINICAL DECISION MAKING: Evolving/moderate complexity   EVALUATION COMPLEXITY: Moderate     GOALS: Goals reviewed with patient? Yes   SHORT TERM GOALS: Target date: 05/16/2022   Patient donnes prosthesis modified independent & verbalizes proper cleaning. Baseline: SEE OBJECTIVE DATA Goal status: INITIAL 2.  Patient tolerates prosthesis >12 hrs total /day without skin issues or limb pain after standing. Baseline: SEE OBJECTIVE DATA Goal status: INITIAL   3. Berg Balance >36/56 Baseline: SEE OBJECTIVE DATA Goal status: INITIAL   4. Patient ambulates 200' with cane & prosthesis with supervision. Baseline: SEE OBJECTIVE DATA Goal status: INITIAL    5. Patient negotiates ramps & curbs with cane & prosthesis with minA. Baseline: SEE OBJECTIVE DATA Goal status: INITIAL   LONG TERM GOALS: Target date: 07/31/2021   Patient demonstrates & verbalized understanding of prosthetic care to enable safe utilization of prosthesis. Baseline: SEE OBJECTIVE DATA Goal status: INITIAL   Patient tolerates prosthesis wear >90% of awake hours without skin or limb pain issues. Baseline: SEE OBJECTIVE DATA Goal status: INITIAL   Functional Gait Assessment >22/30 to indicate lower fall risk Baseline: SEE OBJECTIVE DATA Goal status: INITIAL   Patient ambulates >500' with prosthesis only independently Baseline: SEE OBJECTIVE DATA Goal status: INITIAL   Patient negotiates ramps, curbs & stairs with single rail with prosthesis only independently. Baseline: SEE OBJECTIVE DATA Goal status: INITIAL   Patient demonstrates & verbalizes lifting, carrying, pushing, pulling with prosthesis only safely. Baseline: SEE OBJECTIVE DATA Goal status: INITIAL     PLAN:   PT FREQUENCY: 3x/week for 5 weeks, then 1-2 x/wk for 10 weeks   PT DURATION: other: 15 weeks   PLANNED INTERVENTIONS: Therapeutic exercises, Therapeutic activity, Neuromuscular re-education, Balance training, Gait training, Patient/Family education, Self Care, Stair training, Vestibular training, Prosthetic training, DME instructions, and physical performance testing   PLAN FOR NEXT SESSION: Verbally check HEP at sink, continue prosthetic gait with cane, balance activities   Jamey Reas, PT, DPT 04/24/2022, 2:02 PM

## 2022-04-24 NOTE — Patient Instructions (Signed)

## 2022-04-28 ENCOUNTER — Ambulatory Visit: Payer: 59 | Admitting: Orthopedic Surgery

## 2022-04-28 ENCOUNTER — Ambulatory Visit (HOSPITAL_COMMUNITY)
Admission: RE | Admit: 2022-04-28 | Discharge: 2022-04-28 | Disposition: A | Discharge status: Home / Self Care | Payer: 59 | Source: Ambulatory Visit | Attending: Cardiology | Admitting: Cardiology

## 2022-04-28 ENCOUNTER — Encounter: Payer: Self-pay | Admitting: Physical Therapy

## 2022-04-28 ENCOUNTER — Ambulatory Visit (INDEPENDENT_AMBULATORY_CARE_PROVIDER_SITE_OTHER): Payer: 59 | Admitting: Physical Therapy

## 2022-04-28 DIAGNOSIS — I739 Peripheral vascular disease, unspecified: Secondary | ICD-10-CM | POA: Diagnosis not present

## 2022-04-28 DIAGNOSIS — M6281 Muscle weakness (generalized): Secondary | ICD-10-CM

## 2022-04-28 DIAGNOSIS — R2681 Unsteadiness on feet: Secondary | ICD-10-CM | POA: Diagnosis not present

## 2022-04-28 DIAGNOSIS — S81801A Unspecified open wound, right lower leg, initial encounter: Secondary | ICD-10-CM

## 2022-04-28 DIAGNOSIS — I70202 Unspecified atherosclerosis of native arteries of extremities, left leg: Secondary | ICD-10-CM

## 2022-04-28 DIAGNOSIS — R2689 Other abnormalities of gait and mobility: Secondary | ICD-10-CM | POA: Diagnosis not present

## 2022-04-28 NOTE — Therapy (Signed)
OUTPATIENT PHYSICAL THERAPY TREATMENT NOTE   Patient Name: Raymond [name] MRN: [id number] DOB:1960/12/18, 61 y.o., male Today's Date: 04/28/2022  PCP: [name], FNP  REFERRING PROVIDER: Newt Minion, MD   END OF SESSION:   PT End of Session - 04/28/22 1345     Visit Number 5    Number of Visits 25    Date for PT Re-Evaluation 08/01/22    Authorization Type [employer]    Authorization Time Period NO COPAY, 35 PT/OT VISITS Calendar Year Benefit Date 05/26/2021 - 05/25/2022    Authorization - Number of Visits 35    Progress Note Due on Visit 10    PT Start Time 1345    PT Stop Time 1430    PT Time Calculation (min) 45 min    Equipment Utilized During Treatment Gait belt    Activity Tolerance Patient tolerated treatment well    Behavior During Therapy WFL for tasks assessed/performed              Past Medical History:  Diagnosis Date   Diabetes ([place])    Diabetes mellitus without complication ([place])    type 2   ESRD (end stage renal disease) ([place])    03/09/2015- patient had a kidney transplant   Hypertension    Peripheral vascular disease ([place])    Renal disorder    Renal insufficiency    Past Surgical History:  Procedure Laterality Date   ABDOMINAL AORTOGRAM W/LOWER EXTREMITY N/A 09/11/2021   Procedure: ABDOMINAL AORTOGRAM W/LOWER EXTREMITY;  Surgeon: Wellington Hampshire, MD;  Location: [facility];  Service: Cardiovascular;  Laterality: N/A;   AMPUTATION Right 09/25/2021   Procedure: RIGHT TRANSMETATARSAL AMPUTATION AND APPLY TISSUE GRAFT;  Surgeon: Newt Minion, MD;  Location: [facility];  Service: Orthopedics;  Laterality: Right;   AMPUTATION Right 11/16/2021   Procedure: RIGHT AMPUTATION BELOW KNEE WITH WOUND VAC APPLICATION;  Surgeon: Newt Minion, MD;  Location: [facility];  Service: Orthopedics;  Laterality: Right;   BONE BIOPSY Right 03/06/2021   Procedure: BONE BIOPSY;  Surgeon: Trula Slade, DPM;  Location: [facility];  Service: Podiatry;  Laterality:  Right;   CENTRAL VENOUS CATHETER INSERTION Right 01/20/2020   Procedure: INSERTION CENTRAL LINE ADULT; Tunneled central line;  Surgeon: Virl Cagey, MD;  Location: [facility];  Service: General;  Laterality: Right;   COLONOSCOPY N/A 12/05/2014   SAY:[id number] external and internal hemorrhoid/mild diverticulosis/11 polyps removed   ESOPHAGOGASTRODUODENOSCOPY N/A 12/05/2014   NAT:FTDD duodenitis   GRAFT APPLICATION Right 22/06/5425   Procedure: GRAFT APPLICATION;  Surgeon: Trula Slade, DPM;  Location: [facility];  Service: Podiatry;  Laterality: Right;   INCISION AND DRAINAGE OF WOUND Right 08/15/2021   Procedure: IRRIGATION AND DEBRIDEMENT WOUND;  Surgeon: Landis Martins, DPM;  Location: [facility];  Service: Podiatry;  Laterality: Right;  need to make card not  a irrigation and debridement card   IR FLUORO GUIDE CV LINE RIGHT  03/01/2019   IR PERC TUN PERIT CATH WO PORT S&I /IMAG  06/27/2021   IR REMOVAL TUN CV CATH W/O FL  05/10/2019   IR REMOVAL TUN CV CATH W/O FL  02/29/2020   IR REMOVAL TUN CV CATH W/O FL  07/19/2021   IR US GUIDE VASC ACCESS RIGHT  03/01/2019   IR US GUIDE VASC ACCESS RIGHT  06/27/2021   IRRIGATION AND DEBRIDEMENT ELBOW Left 06/24/2021   Procedure: IRRIGATION AND DEBRIDEMENT ELBOW;  Surgeon: Lovell Sheehan, MD;  Location: [facility];  Service:  Orthopedics;  Laterality: Left;   KIDNEY TRANSPLANT  03/27/2015   Penile Pump Insertion     PERIPHERAL VASCULAR ATHERECTOMY  09/11/2021   Procedure: PERIPHERAL VASCULAR ATHERECTOMY;  Surgeon: Wellington Hampshire, MD;  Location: [facility];  Service: Cardiovascular;;  Sh[facility]wave Lithotripsy   TEE WITHOUT CARDIOVERSION N/A 01/17/2020   Procedure: TRANSESOPHAGEAL ECHOCARDIOGRAM (TEE) WITH PROPOFOL;  Surgeon: Arnoldo Lenis, MD;  [facility];  Service: Endoscopy;  Laterality: N/A;   WOUND DEBRIDEMENT Right 03/06/2021   Procedure: DEBRIDEMENT WOUND;  Surgeon: Trula Slade, DPM;  Location: [facility];  Service:  Podiatry;  Laterality: Right;   Patient Active Problem List   Diagnosis Date Noted   Cutaneous abscess of right foot    Hyperkalemia 08/28/2021   Obesity (BMI 30-39.9) 08/28/2021   History of anemia due to chronic kidney disease 08/28/2021   Diabetic foot ulcer ([place]) 08/14/2021   Hypomagnesemia 06/25/2021   Septic arthritis of elbow, left ([place]) 06/24/2021   Peripheral vascular disease ([place]) 04/24/2020   Pyogenic inflammation of bone ([facility])    Thrombophlebitis of superficial veins of right lower extremity    Difficult intravenous access    MRSA bacteremia    Diabetic foot infection ([place])    Infected wound 01/12/2020   Critical lower limb ischemia ([place]) 02/02/2019   Personal history of noncompliance with medical treatment, presenting hazards to health 10/07/2018   Critical limb ischemia with history of revascularization of same extremity ([place]) 07/27/2018   Mixed hyperlipidemia 02/19/2018   History of renal transplant 02/19/2018   Conductive hearing loss, bilateral 08/31/2017   Nuclear sclerotic cataract of right eye 10/07/2016   Pseudophakia of left eye 10/07/2016   Hypertension due to endocrine disorder 03/04/2016   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 ([place]) 03/04/2016   Renal failure 03/04/2016   Proliferative diabetic retinopathy with macular edema associated with type 2 diabetes mellitus ([place]) 03/04/2016   Immunosuppression ([place]) 12/10/2015   Nausea vomiting and diarrhea 12/10/2015   Type 2 diabetes mellitus ([place]) 12/10/2015   Essential hypertension, benign 12/10/2015   Pancytopenia ([place]) 12/10/2015   Encounter for screening colonoscopy 11/15/2014   GERD (gastroesophageal reflux disease) 11/15/2014    REFERRING DIAG: W62.035D (ICD-10-CM) - Below-knee amputation of right lower extremit   ONSET DATE: 04/10/2022 prosthesis delivery   THERAPY DIAG:  Unsteadiness on feet  Other abnormalities of gait and  mobility  Muscle weakness (generalized)  Wound of right lower extremity, initial encounter  Rationale for Evaluation and Treatment Rehabilitation  PERTINENT HISTORY: DM2, PVD, ESRD s/p renal transplant 03/09/2015 on immunosuppressive meds, HTN   PRECAUTIONS: None  SUBJECTIVE:  SUBJECTIVE STATEMENT:  He wore prosthesis 4hrs on 2 hrs off during awake hours. Limb is not sore today but was over the weekend.    PAIN:  Are you having pain? No  OBJECTIVE: (objective measures completed at initial evaluation unless otherwise dated) POSTURE: rounded shoulders, forward head, flexed trunk , and weight shift left   LOWER EXTREMITY ROM:   ROM P:passive  A:active Right eval Left eval  Hip flexion      Hip extension      Hip abduction      Hip adduction      Hip internal rotation      Hip external rotation      Knee flexion 100*    Knee extension -5*    Ankle dorsiflexion      Ankle plantarflexion      Ankle inversion      Ankle eversion       (Blank rows = not tested)   LOWER EXTREMITY MMT:   MMT Right eval Left eval  Hip flexion 3+/5    Hip extension 3-/5    Hip abduction 3-/5    Hip adduction      Hip internal rotation      Hip external rotation      Knee flexion 3-/5    Knee extension 3/5    Ankle dorsiflexion      Ankle plantarflexion      Ankle inversion      Ankle eversion      (Blank rows = not tested)   TRANSFERS: Sit to stand: 18" chair with armrests to RW with supervision.  Stand to sit: RW to 18" chair with armrests with supervision   PROSTHETIC GAIT WITH BKA PROSTHESIS: Gait pattern: step to pattern, decreased arm swing- Right, decreased step length- Right, decreased stance time- Left, decreased stride length, decreased hip/knee flexion- Right, circumduction- Right, Right hip  hike, knee flexed in stance- Right, antalgic, lateral hip instability, trunk flexed, and abducted- Right Distance walked: 13' with RW & 20' with cane Assistive device utilized: Single point cane, Walker - 2 wheeled, and BKA prosthesis Level of assistance: SBA with RW and Min A Gait velocity: 0.89 ft/sec Comments: excessive weight bearing on RW, partial weight on prosthesis   FUNCTIONAL TESTs:  Timed up and go (TUG): 39.25sec with cane & BKA prosthesis Berg Balance Scale: 24/56     CURRENT PROSTHETIC WEAR ASSESSMENT: Patient is dependent with: skin check, residual limb care, care of non-amputated limb, prosthetic cleaning, ply sock cleaning, correct ply sock adjustment, proper wear schedule/adjustment, and proper weight-bearing schedule/adjustment Donning prosthesis: Min A Doffing prosthesis: SBA Prosthetic wear tolerance: 3 hours, 1-2x/day, 5 of 5 days since delivery Prosthetic weight bearing tolerance: 5 minutes Edema: pitting. Residual limb condition: 20m scab wound on medial incision, dry skin, normal color & temperature, cylindrical shape Prosthetic description: silicon liner with pin lock suspension, total contact socket, hydraulic ankle / K3 foot K code/activity level with prosthetic use: Level 3       TODAY'S TREATMENT:  DATE:  04/28/2022: Prosthetic Training with TTA prosthesis: PT recommended continuing wear it 4 hours on and 2 hours off. PT demo and instructed in driving with right lower extremity amputation.  Patient verbalized understanding. PT demoed how to pick up items off the floor with TTA prosthesis.  Patient able to pick up a washcloth on the floor 5 reps with supervision. PT demo and verbal cues on pelvic orientation for prosthetic gait.  Patient ambulated 150 feet with cane stand-alone tip with tactile and verbal cues for pelvic orientation and  upright posture. PT worked on scanning and maintaining path/pace.  Patient ambulated 300 feet with cane stand-alone tip with tactile cues scanning right/left, up/down and diagonals. PT instructed with demo and verbal cues on stepping over obstacles.  Patient able to step over 4 inch high bar with a cane with min guard x 5 reps.  Patient improved with able to do it in motion with a delay but not having to come to a complete stop. Patient worked on balance for ambulation in the dark but ambulating 3 steps with eyes closed 3 and 3 steps with eyes open for 100 feet.  Patient requires min assist  Neuromuscular reeducation working on upright posture and balance reactions: Standing lower extremity resistance with red Thera-Band kicks forward, abduction, adduction and extension initial 5 reps with cane and chair back support and second 5 reps with cane only with min assist for all 4 directions.   04/24/2022: Prosthetic Training with TTA prosthesis: PT reviewed wear consistently to limit limb pain. PT continue to recommended wear time 4 hours on 2 hours off initiating first wear upon arising.  Patient and wife verbalized understanding. End of session: PT demo and verbal cues on prosthetic gait with cane LUE and counter RUE -carryover of anterior posterior weight shift from initial contact her terminal stance and weight shift right over her prosthesis in stance.  Walking backwards and sideways with cane and counter support.  Neuromuscular Re-ed: HEP at sink working on proprioception with limb, standing balance & standing tolerance. PT demo & verbal cues with HO, pt verbalized & return demo understanding.  Do each exercise 1-2  times per day Do each exercise 5-10 repetitions Hold each exercise for 2 seconds to feel your location  AT [place].  Try to find this position when standing still for activities.   USE TAPE ON FLOOR TO MARK THE  MIDLINE POSITION which is even with middle of sink.  You also should try to feel with your limb pressure in socket.  You are trying to feel with limb what you used to feel with the bottom of your foot.  Side to Side Shift: Moving your hips only (not shoulders): move weight onto your left leg, HOLD/FEEL pressure in socket.  Move back to equal weight on each leg, HOLD/FEEL pressure in socket. Move weight onto your right leg, HOLD/FEEL pressure in socket. Move back to equal weight on each leg, HOLD/FEEL pressure in socket. Repeat.  Start with both hands on sink, progress to hand on prosthetic side only, then no hands.  Front to Back Shift: Moving your hips only (not shoulders): move your weight forward onto your toes, HOLD/FEEL pressure in socket. Move your weight back to equal Flat Foot on both legs, HOLD/FEEL  pressure in socket. Move your weight back onto your heels, HOLD/FEEL  pressure in socket. Move your weight back to equal on both legs, HOLD/FEEL  pressure in socket. Repeat.  Start with both hands on sink, progress to hand on prosthetic side only, then no hands.  Moving Cones / Cups: With equal weight on each leg: Hold on with one hand the first time, then progress to no hand supports. Move cups from one side of sink to the other. Place cups ~2" out of your reach, progress to 10" beyond reach.  Place one hand in middle of sink and reach with other hand. Do both arms.  Then hover one hand and move cups with other hand.  Overhead/Upward Reaching: alternated reaching up to top cabinets or ceiling if no cabinets present. Keep equal weight on each leg. Start with one hand support on counter while other hand reaches and progress to no hand support with reaching.  ace one hand in middle of sink and reach with other hand. Do both arms.  Then hover one hand and move cups with other hand.  5.   Looking Over Shoulders: With equal weight on each leg: alternate turning to look over your shoulders with one hand  support on counter as needed.  Start with head motions only to look in front of shoulder, then even with shoulder and progress to looking behind you. To look to side, move head /eyes, then shoulder on side looking pulls back, shift more weight to side looking and pull hip back. Place one hand in middle of sink and let go with other hand so your shoulder can pull back. Switch hands to look other way.   Then hover one hand and look over shoulder. If looking right, use left hand at sink. If looking left, use right hand at sink. 6.  Stepping with leg that is not amputated:  Move items under cabinet out of your way. Shift your hips/pelvis so weight on prosthesis. Tighten muscles in hip on prosthetic side.  SLOWLY step other leg so front of foot is in cabinet. Then step back to floor.    04/22/2022: Prosthetic Training with TTA prosthesis: Patient's residual limb has no openings.  PT removed a dry scab from medial incision. PT instructed to increase wear time to 4 hours on 2 hours off initiating first wear upon arising.  Patient and wife verbalized understanding. PT educated patient and wife and increased risk of falls when prosthesis is off.  PT recommended using wheelchair near bed at night and walker near chair when his prosthesis is off during the day.  Patient and wife verbalized understanding. PT reviewed proper donning technique including donning socks.  Patient verbalized return demonstration understanding. PT instructed patient and wife and adjusting ply socks.  Patient donne prosthesis and ambulated with rolling walker with too few socks, too many socks and correct ply socks.  Patient verbalized increased understanding of how to adjust socks including need to modify during the day as he pumps fluid out of the limb with gait. PT instructed patient in increasing activity level with short walk (room to room) increasing frequency during day, long walk (max tolerable distance) 1-2 times per day and medium  distance (between short and long distance) 4+ times per day.  Patient verbalized understanding including rationale for increasing activity tolerance.   HOME EXERCISE PROGRAM:   ASSESSMENT:   CLINICAL IMPRESSION: Patient improved gait with cane stand-alone tip including scanning, negotiating around and over obstacles.  Patient improved his ability to retrieve items from the floor with PT instruction in technique.  PT worked on standing balance using lower extremity resistance activities.  He  requires more work prior to sending this home.   OBJECTIVE IMPAIRMENTS: Abnormal gait, decreased activity tolerance, decreased balance, decreased endurance, decreased knowledge of condition, decreased knowledge of use of DME, decreased mobility, decreased ROM, decreased strength, increased edema, impaired flexibility, postural dysfunction, prosthetic dependency , obesity, and wound on residual limb .    ACTIVITY LIMITATIONS: carrying, lifting, bending, standing, squatting, stairs, transfers, locomotion level, and prosthetic use   PARTICIPATION LIMITATIONS: meal prep, cleaning, laundry, driving, community activity, occupation, and yard work   PERSONAL FACTORS: Fitness, Time since onset of injury/illness/exacerbation, and 3+ comorbidities: see PMH  are also affecting patient's functional outcome.    REHAB POTENTIAL: Good   CLINICAL DECISION MAKING: Evolving/moderate complexity   EVALUATION COMPLEXITY: Moderate     GOALS: Goals reviewed with patient? Yes   SHORT TERM GOALS: Target date: 05/16/2022   Patient donnes prosthesis modified independent & verbalizes proper cleaning. Baseline: SEE OBJECTIVE DATA Goal status: INITIAL 2.  Patient tolerates prosthesis >12 hrs total /day without skin issues or limb pain after standing. Baseline: SEE OBJECTIVE DATA Goal status: INITIAL   3. Berg Balance >36/56 Baseline: SEE OBJECTIVE DATA Goal status: INITIAL   4. Patient ambulates 200' with cane &  prosthesis with supervision. Baseline: SEE OBJECTIVE DATA Goal status: INITIAL   5. Patient negotiates ramps & curbs with cane & prosthesis with minA. Baseline: SEE OBJECTIVE DATA Goal status: INITIAL   LONG TERM GOALS: Target date: 07/31/2021   Patient demonstrates & verbalized understanding of prosthetic care to enable safe utilization of prosthesis. Baseline: SEE OBJECTIVE DATA Goal status: INITIAL   Patient tolerates prosthesis wear >90% of awake hours without skin or limb pain issues. Baseline: SEE OBJECTIVE DATA Goal status: INITIAL   Functional Gait Assessment >22/30 to indicate lower fall risk Baseline: SEE OBJECTIVE DATA Goal status: INITIAL   Patient ambulates >500' with prosthesis only independently Baseline: SEE OBJECTIVE DATA Goal status: INITIAL   Patient negotiates ramps, curbs & stairs with single rail with prosthesis only independently. Baseline: SEE OBJECTIVE DATA Goal status: INITIAL   Patient demonstrates & verbalizes lifting, carrying, pushing, pulling with prosthesis only safely. Baseline: SEE OBJECTIVE DATA Goal status: INITIAL     PLAN:   PT FREQUENCY: 3x/week for 5 weeks, then 1-2 x/wk for 10 weeks   PT DURATION: other: 15 weeks   PLANNED INTERVENTIONS: Therapeutic exercises, Therapeutic activity, Neuromuscular re-education, Balance training, Gait training, Patient/Family education, Self Care, Stair training, Vestibular training, Prosthetic training, DME instructions, and physical performance testing   PLAN FOR NEXT SESSION: continue prosthetic gait with cane, balance activities   Jamey Reas, PT, DPT 04/28/2022, 4:54 PM

## 2022-04-29 ENCOUNTER — Ambulatory Visit: Payer: 59 | Admitting: Dietician

## 2022-04-29 ENCOUNTER — Ambulatory Visit
Payer: No Typology Code available for payment source | Attending: Cardiovascular Disease | Admitting: Cardiovascular Disease

## 2022-04-29 ENCOUNTER — Encounter: Payer: Self-pay | Admitting: Cardiovascular Disease

## 2022-04-29 VITALS — BP 138/82 | HR 73 | Ht 68.0 in | Wt 238.0 lb

## 2022-04-29 DIAGNOSIS — E782 Mixed hyperlipidemia: Secondary | ICD-10-CM | POA: Diagnosis not present

## 2022-04-29 DIAGNOSIS — I70229 Atherosclerosis of native arteries of extremities with rest pain, unspecified extremity: Secondary | ICD-10-CM | POA: Diagnosis not present

## 2022-04-29 DIAGNOSIS — I10 Essential (primary) hypertension: Secondary | ICD-10-CM | POA: Diagnosis not present

## 2022-04-29 DIAGNOSIS — Z9889 Other specified postprocedural states: Secondary | ICD-10-CM | POA: Diagnosis not present

## 2022-04-29 NOTE — Patient Instructions (Signed)
Medication Instructions:  Your physician recommends that you continue on your current medications as directed. Please refer to the Current Medication list given to you today.  *If you need a refill on your cardiac medications before your next appointment, please call your pharmacy*   Follow-Up: At [facility], you and your health needs are our priority.  As part of our continuing mission to provide you with exceptional heart care, we have created designated Provider Care Teams.  These Care Teams include your primary Cardiologist (physician) and Advanced Practice Providers (APPs -  Physician Assistants and Nurse Practitioners) who all work together to provide you with the care you need, when you need it.  We recommend signing up for the patient portal called "MyChart".  Sign up information is provided on this After Visit Summary.  MyChart is used to connect with patients for Virtual Visits (Telemedicine).  Patients are able to view lab/test results, encounter notes, upcoming appointments, etc.  Non-urgent messages can be sent to your provider as well.   To learn more about what you can do with MyChart, go to [email or web address].    Your next appointment:   12 month(s)  The format for your next appointment:   In Person  Provider:   Jonathan Berry, MD   

## 2022-04-29 NOTE — Assessment & Plan Note (Signed)
History of hyperlipidemia on statin therapy with lipid profile performed 08/23/2021 revealing total cholesterol 104, LDL 46 and HDL 38.

## 2022-04-29 NOTE — Assessment & Plan Note (Signed)
History of essential hypertension a blood pressure measured today at 138/82.  He is on hydralazine, and clonidine patch.

## 2022-04-29 NOTE — Progress Notes (Signed)
04/29/2022 Raymond [name]   12-27-1960  [id number]  Primary Physician [name], FNP Primary Cardiologist: Lorretta Harp MD FACP, [name], [place], [place]  HPI:  Raymond [name] is a 61 y.o.    moderately overweight separated African-American male father of 2 children, grandfather of 2 grandchildren who I last saw in the office 11/14/2021.[name]  He was was referred by Dr. Jacqualyn Posey, his podiatrist for peripheral vascular valuation because of a small ulcer on the plantar surface of his right first metatarsal.  He has a history of treated hypertension, diabetes and hyperlipidemia.  His father had an MI at age 63 and his brother recently had a myocardial infarction as well.  He is never had a heart attack or stroke.  He denies chest pain or shortness of breath.  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  He apparently stepped on a nail approximately 2 to 3 months ago and developed a wound on the plantar surface of his right first metatarsal which Dr. Jacqualyn Posey has been treating fairly frequently over the last several months.  Apparently this is slowly healing.  He had Dopplers performed in [place] recently that were told showed normal circulation.  Since I saw him 6 months ago the wound on his right foot is healed.  He is developed a new wound on his left foot with recent Dopplers that showed a left TBI 0.28, monophasic waveforms in his left SFA with tibial vessel disease, occluded anterior tibial and peroneal artery.  I suspect he will need a amputation of that toe since he has documented osteomyelitis according to Dr. Jacqualyn Posey.  Is unclear whether this will heal but I suspect based on his Dopplers that this will be difficult.  I am also concerned that angiography will lead to radiocontrast nephropathy and jeopardize his transplanted kidney.   He saw Dr. Jacqualyn Posey  who felt that  his wound was unlikely to heal and that he would require amputation.  Dr. Jacqualyn Posey spoke to the patient's nephrologist echoing my concern about exposure to radiocontrast in the setting of moderate renal insufficiency status post renal artery transplant and apparently the nephrologist felt comfortable proceeding with peripheral angiography potential intervention.   When I saw him in the office 08/21/2021 his right heel wound was poorly healing.  He has 0 vessel runoff below the knee by duplex ultrasound performed 04/02/2021.  I arrange for him to undergo lower extremity angiography by Dr. Fletcher Anon  on 09/11/2021 revealing an occluded right anterior tibial right which reconstituted at the level of the ankle and high-grade posterior tibial artery stenosis.  He underwent shockwave angioplasty followed by balloon angioplasty of the posterior tibial artery.  The majority of the case was done with CO2.  On 09/25/2021 underwent right TMA by Dr. Sharol Given who is following him closely as an outpatient.  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  He denies chest pain or shortness of breath.   Current Meds  Medication Sig   aspirin EC 81 MG tablet Take 1 tablet (81 mg total) by mouth daily. Swallow whole.   atorvastatin (LIPITOR) 10 MG tablet Take 1 tablet by mouth once daily   blood glucose meter kit and supplies KIT Dispense based on patient and insurance preference. Use up to four times daily as directed. (  FOR ICD-9 250.00, 250.01).   Blood Glucose Monitoring Suppl (ACCU-CHEK GUIDE ME) w/Device KIT 1 Piece by Does not apply route as directed.   brimonidine (ALPHAGAN) 0.2 % ophthalmic solution Place 1 drop into both eyes 3 (three) times daily.   cloNIDine (CATAPRES - DOSED IN MG/24 HR) 0.3 mg/24hr patch Place 1 patch (0.3 mg total) onto the skin once a week. (Patient taking differently: Place 0.3 mg onto the skin once a week. Place patch on  Sunday)   clopidogrel (PLAVIX) 75 MG tablet Take 1 tablet (75 mg total) by mouth daily.   Continuous Blood Gluc Receiver (DEXCOM G7 RECEIVER) DEVI Use to check blood sugar 3 times a day. Dx code e11.65   Continuous Blood Gluc Sensor (DEXCOM G7 SENSOR) MISC Use to check blood sugar 3 times a day. Dx code e11.65   docusate sodium (COLACE) 100 MG capsule Take 1 capsule (100 mg total) by mouth daily.   dorzolamide-timolol (COSOPT) 22.3-6.8 MG/ML ophthalmic solution Place 1 drop into both eyes 2 (two) times daily.   doxazosin (CARDURA) 4 MG tablet Take 4 mg by mouth daily.   Dulaglutide (TRULICITY) 3 TJ/0.3ES SOPN Inject 3 mg as directed once a week.   furosemide (LASIX) 20 MG tablet 20 mg 2 (two) times daily.   gabapentin (NEURONTIN) 300 MG capsule Take 3 capsules (900 mg total) by mouth 3 (three) times daily. 3 times a day   glucose blood (ACCU-CHEK GUIDE) test strip Use as instructed 4 x daily. E11.65 (Patient taking differently: 3 (three) times daily.)   hydrALAZINE (APRESOLINE) 25 MG tablet Take 1 tablet (25 mg total) by mouth 3 (three) times daily.   Insulin Regular Human (NOVOLIN R FLEXPEN RELION) 100 UNIT/ML KwikPen Inject 1-9 Units as directed 3 (three) times daily before meals. Sliding scale CBG 70 - 120: 0 units CBG 121 - 150: 1 unit,  CBG 151 - 200: 2 units,  CBG 201 - 250: 3 units,  CBG 251 - 300: 5 units,  CBG 301 - 350: 7 units,  CBG 351 - 400: 9 units   CBG > 400: 9 units and notify your doctor   latanoprost (XALATAN) 0.005 % ophthalmic solution Place 1 drop into both eyes at bedtime.   Multiple Vitamin (MULTIVITAMIN) capsule Take 1 capsule by mouth daily.   mupirocin ointment (BACTROBAN) 2 % Apply 1 application. topically 2 (two) times daily. For wound care   NOVOLIN N FLEXPEN 100 UNIT/ML FlexPen INJECT 30 UNITS IN THE MORNING AND 45 UNITS AT BEDTIME   ondansetron (ZOFRAN) 4 MG tablet Take 1 tablet (4 mg total) by mouth every 6 (six) hours as needed for nausea.   sodium bicarbonate 650  MG tablet Take 1,300 mg by mouth 3 (three) times daily.   sodium zirconium cyclosilicate (LOKELMA) 10 g PACK packet Take 10 g by mouth daily.   tacrolimus (PROGRAF) 1 MG capsule Take 4 capsules (4 mg total) by mouth 2 (two) times daily.   Vitamin D, Ergocalciferol, (DRISDOL) 50000 units CAPS capsule Take 50,000 Units by mouth See admin instructions. 15th of the month   Current Facility-Administered Medications for the 04/29/22 encounter (Office Visit) with Lorretta Harp, MD  Medication   sodium chloride flush (NS) 0.9 % injection 3 mL     Allergies  Allergen Reactions   Doxycycline     Runs up pt's blood sugar and phosphorus   Bactrim [Sulfamethoxazole-Trimethoprim]     Runs pt's sugar and phosphorus   Flagyl [Metronidazole] Rash  Patient having hives to either the vancomycin or flagyl (both infusing together).   Vancomycin Rash    Patient having hives to either vancomycin or flagyl    Social History   Socioeconomic History   Marital status: Married    Spouse name: Not on file   Number of children: Not on file   Years of education: Not on file   Highest education level: Not on file  Occupational History   Not on file  Tobacco Use   Smoking status: Never   Smokeless tobacco: Never  Vaping Use   Vaping Use: Never used  Substance and Sexual Activity   Alcohol use: No    Alcohol/week: 0.0 standard drinks of alcohol   Drug use: No   Sexual activity: Yes  Other Topics Concern   Not on file  Social History Narrative   ** Merged History Encounter **       Social Determinants of Health   Financial Resource Strain: Not on file  Food Insecurity: Not on file  Transportation Needs: Not on file  Physical Activity: Not on file  Stress: Not on file  Social Connections: Not on file  Intimate Partner Violence: Not on file     Review of Systems: General: negative for chills, fever, night sweats or weight changes.  Cardiovascular: negative for chest pain, dyspnea on  exertion, edema, orthopnea, palpitations, paroxysmal nocturnal dyspnea or shortness of breath Dermatological: negative for rash Respiratory: negative for cough or wheezing Urologic: negative for hematuria Abdominal: negative for nausea, vomiting, diarrhea, bright red blood per rectum, melena, or hematemesis Neurologic: negative for visual changes, syncope, or dizziness All other systems reviewed and are otherwise negative except as noted above.    Blood pressure 138/82, pulse 73, height _0  (1.727 m), weight 238 lb (108 kg), SpO2 97 %.  General appearance: alert and no distress Neck: no adenopathy, no carotid bruit, no JVD, supple, symmetrical, trachea midline, and thyroid not enlarged, symmetric, no tenderness/mass/nodules Lungs: clear to auscultation bilaterally Heart: regular rate and rhythm, S1, S2 normal, no murmur, click, rub or gallop Extremities: Status post right BKA Pulses: Right BKA, diminished left pedal pulse Skin: Skin color, texture, turgor normal. No rashes or lesions Neurologic: Grossly normal  EKG not performed today  ASSESSMENT AND PLAN:   Essential hypertension, benign History of essential hypertension a blood pressure measured today at 138/82.  He is on hydralazine, and clonidine patch.  Mixed hyperlipidemia History of hyperlipidemia on statin therapy with lipid profile performed 08/23/2021 revealing total cholesterol 104, LDL 46 and HDL 38.     Lorretta Harp MD [facility], [facility] 04/29/2022 9:32 AM

## 2022-04-29 NOTE — Assessment & Plan Note (Signed)
History of critical limb ischemia status post complex peripheral angiographic/endovascular procedure by Dr. Fletcher Anon  09/11/2021 with shockwave angioplasty followed by balloon angioplasty of the posterior tibial artery done mostly with CO2.  He ultimately underwent a TMA by Dr. Sharol Given  to 09/25/2021 but because of recurrent ulcer and lack of healing he separately underwent right BKA in June of this year.  He is currently wearing a prosthesis.

## 2022-04-30 ENCOUNTER — Encounter: Payer: Self-pay | Admitting: Family

## 2022-04-30 ENCOUNTER — Ambulatory Visit (INDEPENDENT_AMBULATORY_CARE_PROVIDER_SITE_OTHER): Payer: 59 | Admitting: Family

## 2022-04-30 ENCOUNTER — Encounter: Payer: Self-pay | Admitting: Physical Therapy

## 2022-04-30 ENCOUNTER — Ambulatory Visit (INDEPENDENT_AMBULATORY_CARE_PROVIDER_SITE_OTHER): Payer: 59 | Admitting: Physical Therapy

## 2022-04-30 DIAGNOSIS — S81801A Unspecified open wound, right lower leg, initial encounter: Secondary | ICD-10-CM

## 2022-04-30 DIAGNOSIS — M6281 Muscle weakness (generalized): Secondary | ICD-10-CM | POA: Diagnosis not present

## 2022-04-30 DIAGNOSIS — R2681 Unsteadiness on feet: Secondary | ICD-10-CM

## 2022-04-30 DIAGNOSIS — R2689 Other abnormalities of gait and mobility: Secondary | ICD-10-CM

## 2022-04-30 DIAGNOSIS — Z89511 Acquired absence of right leg below knee: Secondary | ICD-10-CM | POA: Diagnosis not present

## 2022-04-30 DIAGNOSIS — S88111A Complete traumatic amputation at level between knee and ankle, right lower leg, initial encounter: Secondary | ICD-10-CM

## 2022-04-30 NOTE — Therapy (Signed)
OUTPATIENT PHYSICAL THERAPY TREATMENT NOTE   Patient Name: Raymond [name] MRN: [id number] DOB:04-02-1961, 61 y.o., male Today's Date: 04/30/2022  PCP: [name], FNP  REFERRING PROVIDER: Newt Minion, MD   END OF SESSION:   PT End of Session - 04/30/22 0849     Visit Number 6    Number of Visits 25    Date for PT Re-Evaluation 08/01/22    Authorization Type [employer]    Authorization Time Period NO COPAY, 35 PT/OT VISITS Calendar Year Benefit Date 05/26/2021 - 05/25/2022    Authorization - Number of Visits 35    Progress Note Due on Visit 10    PT Start Time 0845    PT Stop Time 0930    PT Time Calculation (min) 45 min    Equipment Utilized During Treatment Gait belt    Activity Tolerance Patient tolerated treatment well    Behavior During Therapy WFL for tasks assessed/performed               Past Medical History:  Diagnosis Date   Diabetes ([place])    Diabetes mellitus without complication ([place])    type 2   ESRD (end stage renal disease) ([place])    03/09/2015- patient had a kidney transplant   Hypertension    Peripheral vascular disease ([place])    Renal disorder    Renal insufficiency    Past Surgical History:  Procedure Laterality Date   ABDOMINAL AORTOGRAM W/LOWER EXTREMITY N/A 09/11/2021   Procedure: ABDOMINAL AORTOGRAM W/LOWER EXTREMITY;  Surgeon: Wellington Hampshire, MD;  Location: [facility];  Service: Cardiovascular;  Laterality: N/A;   AMPUTATION Right 09/25/2021   Procedure: RIGHT TRANSMETATARSAL AMPUTATION AND APPLY TISSUE GRAFT;  Surgeon: Newt Minion, MD;  Location: [facility];  Service: Orthopedics;  Laterality: Right;   AMPUTATION Right 11/16/2021   Procedure: RIGHT AMPUTATION BELOW KNEE WITH WOUND VAC APPLICATION;  Surgeon: Newt Minion, MD;  Location: [facility];  Service: Orthopedics;  Laterality: Right;   BONE BIOPSY Right 03/06/2021   Procedure: BONE BIOPSY;  Surgeon: Trula Slade, DPM;  Location: [facility];  Service: Podiatry;   Laterality: Right;   CENTRAL VENOUS CATHETER INSERTION Right 01/20/2020   Procedure: INSERTION CENTRAL LINE ADULT; Tunneled central line;  Surgeon: Virl Cagey, MD;  Location: [facility];  Service: General;  Laterality: Right;   COLONOSCOPY N/A 12/05/2014   FKC:LEXNTZGY external and internal hemorrhoid/mild diverticulosis/11 polyps removed   ESOPHAGOGASTRODUODENOSCOPY N/A 12/05/2014   FVC:BSWH duodenitis   GRAFT APPLICATION Right 67/59/1638   Procedure: GRAFT APPLICATION;  Surgeon: Trula Slade, DPM;  Location: [facility];  Service: Podiatry;  Laterality: Right;   INCISION AND DRAINAGE OF WOUND Right 08/15/2021   Procedure: IRRIGATION AND DEBRIDEMENT WOUND;  Surgeon: Landis Martins, DPM;  Location: [facility];  Service: Podiatry;  Laterality: Right;  need to make card not  a irrigation and debridement card   IR FLUORO GUIDE CV LINE RIGHT  03/01/2019   IR PERC TUN PERIT CATH WO PORT S&I /IMAG  06/27/2021   IR REMOVAL TUN CV CATH W/O FL  05/10/2019   IR REMOVAL TUN CV CATH W/O FL  02/29/2020   IR REMOVAL TUN CV CATH W/O FL  07/19/2021   IR US GUIDE VASC ACCESS RIGHT  03/01/2019   IR US GUIDE VASC ACCESS RIGHT  06/27/2021   IRRIGATION AND DEBRIDEMENT ELBOW Left 06/24/2021   Procedure: IRRIGATION AND DEBRIDEMENT ELBOW;  Surgeon: Lovell Sheehan, MD;  Location: [facility];  Service: Orthopedics;  Laterality: Left;   KIDNEY TRANSPLANT  03/27/2015   Penile Pump Insertion     PERIPHERAL VASCULAR ATHERECTOMY  09/11/2021   Procedure: PERIPHERAL VASCULAR ATHERECTOMY;  Surgeon: Wellington Hampshire, MD;  Location: [facility];  Service: Cardiovascular;;  Sh[facility]wave Lithotripsy   TEE WITHOUT CARDIOVERSION N/A 01/17/2020   Procedure: TRANSESOPHAGEAL ECHOCARDIOGRAM (TEE) WITH PROPOFOL;  Surgeon: Arnoldo Lenis, MD;  [facility];  Service: Endoscopy;  Laterality: N/A;   WOUND DEBRIDEMENT Right 03/06/2021   Procedure: DEBRIDEMENT WOUND;  Surgeon: Trula Slade, DPM;  Location: [facility];   Service: Podiatry;  Laterality: Right;   Patient Active Problem List   Diagnosis Date Noted   Cutaneous abscess of right foot    Hyperkalemia 08/28/2021   Obesity (BMI 30-39.9) 08/28/2021   History of anemia due to chronic kidney disease 08/28/2021   Diabetic foot ulcer ([place]) 08/14/2021   Hypomagnesemia 06/25/2021   Septic arthritis of elbow, left ([place]) 06/24/2021   Peripheral vascular disease ([place]) 04/24/2020   Pyogenic inflammation of bone ([facility])    Thrombophlebitis of superficial veins of right lower extremity    Difficult intravenous access    MRSA bacteremia    Diabetic foot infection ([place])    Infected wound 01/12/2020   Critical lower limb ischemia ([place]) 02/02/2019   Personal history of noncompliance with medical treatment, presenting hazards to health 10/07/2018   Critical limb ischemia with history of revascularization of same extremity ([place]) 07/27/2018   Mixed hyperlipidemia 02/19/2018   History of renal transplant 02/19/2018   Conductive hearing loss, bilateral 08/31/2017   Nuclear sclerotic cataract of right eye 10/07/2016   Pseudophakia of left eye 10/07/2016   Hypertension due to endocrine disorder 03/04/2016   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 ([place]) 03/04/2016   Renal failure 03/04/2016   Proliferative diabetic retinopathy with macular edema associated with type 2 diabetes mellitus ([place]) 03/04/2016   Immunosuppression ([place]) 12/10/2015   Nausea vomiting and diarrhea 12/10/2015   Type 2 diabetes mellitus ([place]) 12/10/2015   Essential hypertension, benign 12/10/2015   Pancytopenia ([place]) 12/10/2015   Encounter for screening colonoscopy 11/15/2014   GERD (gastroesophageal reflux disease) 11/15/2014    REFERRING DIAG: M19.622W (ICD-10-CM) - Below-knee amputation of right lower extremit   ONSET DATE: 04/10/2022 prosthesis delivery   THERAPY DIAG:  Unsteadiness on feet  Other abnormalities of  gait and mobility  Muscle weakness (generalized)  Wound of right lower extremity, initial encounter  Rationale for Evaluation and Treatment Rehabilitation  PERTINENT HISTORY: DM2, PVD, ESRD s/p renal transplant 03/09/2015 on immunosuppressive meds, HTN   PRECAUTIONS: None  SUBJECTIVE:  SUBJECTIVE STATEMENT:  He verbalizes adjusting ply socks during day.    PAIN:  Are you having pain? No  OBJECTIVE: (objective measures completed at initial evaluation unless otherwise dated) POSTURE: rounded shoulders, forward head, flexed trunk , and weight shift left   LOWER EXTREMITY ROM:   ROM P:passive  A:active Right eval Left eval  Hip flexion      Hip extension      Hip abduction      Hip adduction      Hip internal rotation      Hip external rotation      Knee flexion 100*    Knee extension -5*    Ankle dorsiflexion      Ankle plantarflexion      Ankle inversion      Ankle eversion       (Blank rows = not tested)   LOWER EXTREMITY MMT:   MMT Right eval Left eval  Hip flexion 3+/5    Hip extension 3-/5    Hip abduction 3-/5    Hip adduction      Hip internal rotation      Hip external rotation      Knee flexion 3-/5    Knee extension 3/5    Ankle dorsiflexion      Ankle plantarflexion      Ankle inversion      Ankle eversion      (Blank rows = not tested)   TRANSFERS: Sit to stand: 18" chair with armrests to RW with supervision.  Stand to sit: RW to 18" chair with armrests with supervision   PROSTHETIC GAIT WITH BKA PROSTHESIS: Gait pattern: step to pattern, decreased arm swing- Right, decreased step length- Right, decreased stance time- Left, decreased stride length, decreased hip/knee flexion- Right, circumduction- Right, Right hip hike, knee flexed in stance- Right, antalgic,  lateral hip instability, trunk flexed, and abducted- Right Distance walked: 39' with RW & 20' with cane Assistive device utilized: Single point cane, Walker - 2 wheeled, and BKA prosthesis Level of assistance: SBA with RW and Min A Gait velocity: 0.89 ft/sec Comments: excessive weight bearing on RW, partial weight on prosthesis   FUNCTIONAL TESTs:  Timed up and go (TUG): 39.25sec with cane & BKA prosthesis Berg Balance Scale: 24/56     CURRENT PROSTHETIC WEAR ASSESSMENT: Patient is dependent with: skin check, residual limb care, care of non-amputated limb, prosthetic cleaning, ply sock cleaning, correct ply sock adjustment, proper wear schedule/adjustment, and proper weight-bearing schedule/adjustment Donning prosthesis: Min A Doffing prosthesis: SBA Prosthetic wear tolerance: 3 hours, 1-2x/day, 5 of 5 days since delivery Prosthetic weight bearing tolerance: 5 minutes Edema: pitting. Residual limb condition: 21m scab wound on medial incision, dry skin, normal color & temperature, cylindrical shape Prosthetic description: silicon liner with pin lock suspension, total contact socket, hydraulic ankle / K3 foot K code/activity level with prosthetic use: Level 3       TODAY'S TREATMENT:  DATE:  04/30/2022: Prosthetic Training with TTA prosthesis: PT recommended checking ply sock in standing with back to wall for balance. Pt verbalized understanding.  PT recommended increasing wear to 5 hours on and 2 hours off rotating during awake hours with last time until bed time.  PT demo & verbal cues on decreasing prosthetic abduction & LLE rotation of heel onto line of progression. Pt amb along line for visual reference, then 4 correct 2 abd along line for proprioception. Progressed to no line 4 correct 2 abd for proprioception. Finally to correct if feels wrong.  PT demo &  verbal cues on weight shift forward with LLE heel rise in terminal stance. Pt performed in //bars. PT demo & cues on cane placement. Pt return demo understanding.  PT demo & verbal cues on side stepping then using similar push/pull weight shift for 90* turn rt & lt. Pt return demo understanding.   04/28/2022: Prosthetic Training with TTA prosthesis: PT recommended continuing wear it 4 hours on and 2 hours off. PT demo and instructed in driving with right lower extremity amputation.  Patient verbalized understanding. PT demoed how to pick up items off the floor with TTA prosthesis.  Patient able to pick up a washcloth on the floor 5 reps with supervision. PT demo and verbal cues on pelvic orientation for prosthetic gait.  Patient ambulated 150 feet with cane stand-alone tip with tactile and verbal cues for pelvic orientation and upright posture. PT worked on scanning and maintaining path/pace.  Patient ambulated 300 feet with cane stand-alone tip with tactile cues scanning right/left, up/down and diagonals. PT instructed with demo and verbal cues on stepping over obstacles.  Patient able to step over 4 inch high bar with a cane with min guard x 5 reps.  Patient improved with able to do it in motion with a delay but not having to come to a complete stop. Patient worked on balance for ambulation in the dark but ambulating 3 steps with eyes closed 3 and 3 steps with eyes open for 100 feet.  Patient requires min assist  Neuromuscular reeducation working on upright posture and balance reactions: Standing lower extremity resistance with red Thera-Band kicks forward, abduction, adduction and extension initial 5 reps with cane and chair back support and second 5 reps with cane only with min assist for all 4 directions.   04/24/2022: Prosthetic Training with TTA prosthesis: PT reviewed wear consistently to limit limb pain. PT continue to recommended wear time 4 hours on 2 hours off initiating first wear upon  arising.  Patient and wife verbalized understanding. End of session: PT demo and verbal cues on prosthetic gait with cane LUE and counter RUE -carryover of anterior posterior weight shift from initial contact her terminal stance and weight shift right over her prosthesis in stance.  Walking backwards and sideways with cane and counter support.  Neuromuscular Re-ed: HEP at sink working on proprioception with limb, standing balance & standing tolerance. PT demo & verbal cues with HO, pt verbalized & return demo understanding.  HOME EXERCISE PROGRAM:  Do each exercise 1-2  times per day Do each exercise 5-10 repetitions Hold each exercise for 2 seconds to feel your location  AT [place].  Try to find this position when standing still for activities.   USE TAPE ON FLOOR TO MARK THE MIDLINE POSITION which is even with middle of sink.  You also should try to feel with  your limb pressure in socket.  You are trying to feel with limb what you used to feel with the bottom of your foot.  Side to Side Shift: Moving your hips only (not shoulders): move weight onto your left leg, HOLD/FEEL pressure in socket.  Move back to equal weight on each leg, HOLD/FEEL pressure in socket. Move weight onto your right leg, HOLD/FEEL pressure in socket. Move back to equal weight on each leg, HOLD/FEEL pressure in socket. Repeat.  Start with both hands on sink, progress to hand on prosthetic side only, then no hands.  Front to Back Shift: Moving your hips only (not shoulders): move your weight forward onto your toes, HOLD/FEEL pressure in socket. Move your weight back to equal Flat Foot on both legs, HOLD/FEEL  pressure in socket. Move your weight back onto your heels, HOLD/FEEL  pressure in socket. Move your weight back to equal on both legs, HOLD/FEEL  pressure in socket. Repeat.  Start with both hands on sink, progress to hand on prosthetic side only,  then no hands.  Moving Cones / Cups: With equal weight on each leg: Hold on with one hand the first time, then progress to no hand supports. Move cups from one side of sink to the other. Place cups ~2" out of your reach, progress to 10" beyond reach.  Place one hand in middle of sink and reach with other hand. Do both arms.  Then hover one hand and move cups with other hand.  Overhead/Upward Reaching: alternated reaching up to top cabinets or ceiling if no cabinets present. Keep equal weight on each leg. Start with one hand support on counter while other hand reaches and progress to no hand support with reaching.  ace one hand in middle of sink and reach with other hand. Do both arms.  Then hover one hand and move cups with other hand.  5.   Looking Over Shoulders: With equal weight on each leg: alternate turning to look over your shoulders with one hand support on counter as needed.  Start with head motions only to look in front of shoulder, then even with shoulder and progress to looking behind you. To look to side, move head /eyes, then shoulder on side looking pulls back, shift more weight to side looking and pull hip back. Place one hand in middle of sink and let go with other hand so your shoulder can pull back. Switch hands to look other way.   Then hover one hand and look over shoulder. If looking right, use left hand at sink. If looking left, use right hand at sink. 6.  Stepping with leg that is not amputated:  Move items under cabinet out of your way. Shift your hips/pelvis so weight on prosthesis. Tighten muscles in hip on prosthetic side.  SLOWLY step other leg so front of foot is in cabinet. Then step back to floor.      ASSESSMENT:   CLINICAL IMPRESSION: Patient improved his prosthetic gait with cane with PT instructions for step width & wt shift over prosthesis in stance.   He requires more work prior to sending this home.   OBJECTIVE IMPAIRMENTS: Abnormal gait, decreased activity  tolerance, decreased balance, decreased endurance, decreased knowledge of condition, decreased knowledge of use of DME, decreased mobility, decreased ROM, decreased strength, increased edema, impaired flexibility, postural dysfunction, prosthetic dependency , obesity, and wound on residual limb .    ACTIVITY LIMITATIONS: carrying, lifting, bending, standing, squatting, stairs, transfers, locomotion level, and prosthetic use  PARTICIPATION LIMITATIONS: meal prep, cleaning, laundry, driving, community activity, occupation, and yard work   PERSONAL FACTORS: Fitness, Time since onset of injury/illness/exacerbation, and 3+ comorbidities: see PMH  are also affecting patient's functional outcome.    REHAB POTENTIAL: Good   CLINICAL DECISION MAKING: Evolving/moderate complexity   EVALUATION COMPLEXITY: Moderate     GOALS: Goals reviewed with patient? Yes   SHORT TERM GOALS: Target date: 05/16/2022   Patient donnes prosthesis modified independent & verbalizes proper cleaning. Baseline: SEE OBJECTIVE DATA Goal status: INITIAL 2.  Patient tolerates prosthesis >12 hrs total /day without skin issues or limb pain after standing. Baseline: SEE OBJECTIVE DATA Goal status: INITIAL   3. Berg Balance >36/56 Baseline: SEE OBJECTIVE DATA Goal status: INITIAL   4. Patient ambulates 200' with cane & prosthesis with supervision. Baseline: SEE OBJECTIVE DATA Goal status: INITIAL   5. Patient negotiates ramps & curbs with cane & prosthesis with minA. Baseline: SEE OBJECTIVE DATA Goal status: INITIAL   LONG TERM GOALS: Target date: 07/31/2021   Patient demonstrates & verbalized understanding of prosthetic care to enable safe utilization of prosthesis. Baseline: SEE OBJECTIVE DATA Goal status: INITIAL   Patient tolerates prosthesis wear >90% of awake hours without skin or limb pain issues. Baseline: SEE OBJECTIVE DATA Goal status: INITIAL   Functional Gait Assessment >22/30 to indicate lower fall  risk Baseline: SEE OBJECTIVE DATA Goal status: INITIAL   Patient ambulates >500' with prosthesis only independently Baseline: SEE OBJECTIVE DATA Goal status: INITIAL   Patient negotiates ramps, curbs & stairs with single rail with prosthesis only independently. Baseline: SEE OBJECTIVE DATA Goal status: INITIAL   Patient demonstrates & verbalizes lifting, carrying, pushing, pulling with prosthesis only safely. Baseline: SEE OBJECTIVE DATA Goal status: INITIAL     PLAN:   PT FREQUENCY: 3x/week for 5 weeks, then 1-2 x/wk for 10 weeks   PT DURATION: other: 15 weeks   PLANNED INTERVENTIONS: Therapeutic exercises, Therapeutic activity, Neuromuscular re-education, Balance training, Gait training, Patient/Family education, Self Care, Stair training, Vestibular training, Prosthetic training, DME instructions, and physical performance testing   PLAN FOR NEXT SESSION: work on ramps & curbs with cane, stairs with 2 rails using RLE,  continue prosthetic gait with cane, balance activities   Jamey Reas, PT, DPT 04/30/2022, 3:43 PM

## 2022-04-30 NOTE — Progress Notes (Signed)
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 Is in PT with his prosthesis currently. His prosthesis is currently ill fitting . Has appointment tomorrow with [facility] for modifications.  Ortho Exam On examination of right residual limb this is well consolidated and well healed. No callus or impending skin breakdown.  Visit Diagnoses: No diagnosis found.  Plan: given handicap parking plate forms. Will follow up as needed. Continue PT.  Follow-Up Instructions: No follow-ups on file.   Imaging: No results found.  Orders:  No orders of the defined types were placed in this encounter.  No orders of the defined types were placed in this encounter.    PMFS History: Patient Active Problem List   Diagnosis Date Noted   Cutaneous abscess of right foot    Hyperkalemia 08/28/2021   Obesity (BMI 30-39.9) 08/28/2021   History of anemia due to chronic kidney disease 08/28/2021   Diabetic foot ulcer ([place]) 08/14/2021   Hypomagnesemia 06/25/2021   Septic arthritis of elbow, left ([place]) 06/24/2021   Peripheral vascular disease ([place]) 04/24/2020   Pyogenic inflammation of bone ([facility])    Thrombophlebitis of superficial veins of right lower extremity    Difficult intravenous access    MRSA bacteremia    Diabetic foot infection ([place])    Infected wound 01/12/2020   Critical lower limb ischemia ([place]) 02/02/2019   Personal history of noncompliance with medical treatment, presenting hazards to health 10/07/2018   Critical limb ischemia with history of revascularization of same extremity ([place]) 07/27/2018   Mixed hyperlipidemia 02/19/2018   History of renal  transplant 02/19/2018   Conductive hearing loss, bilateral 08/31/2017   Nuclear sclerotic cataract of right eye 10/07/2016   Pseudophakia of left eye 10/07/2016   Hypertension due to endocrine disorder 03/04/2016   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 ([place]) 03/04/2016   Renal failure 03/04/2016   Proliferative diabetic retinopathy with macular edema associated with type 2 diabetes mellitus ([place]) 03/04/2016   Immunosuppression ([place]) 12/10/2015   Nausea vomiting and diarrhea 12/10/2015   Type 2 diabetes mellitus ([place]) 12/10/2015   Essential hypertension, benign 12/10/2015   Pancytopenia ([place]) 12/10/2015   Encounter for screening colonoscopy 11/15/2014   GERD (gastroesophageal reflux disease) 11/15/2014   Past Medical History:  Diagnosis Date   Diabetes ([place])    Diabetes mellitus without complication ([place])    type 2   ESRD (end stage renal disease) ([place])    03/09/2015- patient had a kidney transplant   Hypertension    Peripheral vascular disease ([place])    Renal disorder    Renal insufficiency     Family History  Problem Relation Age of Onset   Diabetes Mother    Heart disease Father    Colon cancer Neg Hx     Past Surgical History:  Procedure Laterality Date   ABDOMINAL AORTOGRAM W/LOWER EXTREMITY N/A 09/11/2021   Procedure: ABDOMINAL AORTOGRAM W/LOWER EXTREMITY;  Surgeon: Wellington Hampshire, MD;  Location: [facility];  Service: Cardiovascular;  Laterality: N/A;   AMPUTATION Right 09/25/2021   Procedure: RIGHT TRANSMETATARSAL AMPUTATION AND APPLY TISSUE GRAFT;  Surgeon: Newt Minion, MD;  Location: [facility];  Service: Orthopedics;  Laterality: Right;   AMPUTATION Right 11/16/2021   Procedure: RIGHT AMPUTATION BELOW KNEE WITH WOUND VAC APPLICATION;  Surgeon: Newt Minion, MD;  Location: [facility];  Service: Orthopedics;  Laterality: Right;   BONE BIOPSY Right 03/06/2021   Procedure: BONE BIOPSY;  Surgeon:  Trula Slade, DPM;  Location: [facility];  Service: Podiatry;  Laterality: Right;   CENTRAL VENOUS CATHETER INSERTION Right 01/20/2020   Procedure: INSERTION CENTRAL LINE ADULT; Tunneled central line;  Surgeon: Virl Cagey, MD;  Location: [facility];  Service: General;  Laterality: Right;   COLONOSCOPY N/A 12/05/2014   VEL:[id number] external and internal hemorrhoid/mild diverticulosis/11 polyps removed   ESOPHAGOGASTRODUODENOSCOPY N/A 12/05/2014   [facility]:ENID duodenitis   GRAFT APPLICATION Right 78/24/2353   Procedure: GRAFT APPLICATION;  Surgeon: Trula Slade, DPM;  Location: [facility];  Service: Podiatry;  Laterality: Right;   INCISION AND DRAINAGE OF WOUND Right 08/15/2021   Procedure: IRRIGATION AND DEBRIDEMENT WOUND;  Surgeon: Landis Martins, DPM;  Location: [facility];  Service: Podiatry;  Laterality: Right;  need to make card not  a irrigation and debridement card   IR FLUORO GUIDE CV LINE RIGHT  03/01/2019   IR PERC TUN PERIT CATH WO PORT S&I /IMAG  06/27/2021   IR REMOVAL TUN CV CATH W/O FL  05/10/2019   IR REMOVAL TUN CV CATH W/O FL  02/29/2020   IR REMOVAL TUN CV CATH W/O FL  07/19/2021   IR US GUIDE VASC ACCESS RIGHT  03/01/2019   IR US GUIDE VASC ACCESS RIGHT  06/27/2021   IRRIGATION AND DEBRIDEMENT ELBOW Left 06/24/2021   Procedure: IRRIGATION AND DEBRIDEMENT ELBOW;  Surgeon: Lovell Sheehan, MD;  Location: [facility];  Service: Orthopedics;  Laterality: Left;   KIDNEY TRANSPLANT  03/27/2015   Penile Pump Insertion     PERIPHERAL VASCULAR ATHERECTOMY  09/11/2021   Procedure: PERIPHERAL VASCULAR ATHERECTOMY;  Surgeon: Wellington Hampshire, MD;  Location: [facility];  Service: Cardiovascular;;  Shockwave Lithotripsy   TEE WITHOUT CARDIOVERSION N/A 01/17/2020   Procedure: TRANSESOPHAGEAL ECHOCARDIOGRAM (TEE) WITH PROPOFOL;  Surgeon: Arnoldo Lenis, MD;  [facility];  Service: Endoscopy;  Laterality: N/A;   WOUND DEBRIDEMENT Right 03/06/2021   Procedure: DEBRIDEMENT  WOUND;  Surgeon: Trula Slade, DPM;  Location: [facility];  Service: Podiatry;  Laterality: Right;   Social History   Occupational History   Not on file  Tobacco Use   Smoking status: Never   Smokeless tobacco: Never  Vaping Use   Vaping Use: Never used  Substance and Sexual Activity   Alcohol use: No    Alcohol/week: 0.0 standard drinks of alcohol   Drug use: No   Sexual activity: Yes

## 2022-05-01 ENCOUNTER — Ambulatory Visit (INDEPENDENT_AMBULATORY_CARE_PROVIDER_SITE_OTHER): Payer: 59 | Admitting: Physical Therapy

## 2022-05-01 ENCOUNTER — Encounter: Payer: Self-pay | Admitting: Physical Therapy

## 2022-05-01 DIAGNOSIS — S81801A Unspecified open wound, right lower leg, initial encounter: Secondary | ICD-10-CM | POA: Diagnosis not present

## 2022-05-01 DIAGNOSIS — R2689 Other abnormalities of gait and mobility: Secondary | ICD-10-CM | POA: Diagnosis not present

## 2022-05-01 DIAGNOSIS — R2681 Unsteadiness on feet: Secondary | ICD-10-CM

## 2022-05-01 DIAGNOSIS — M6281 Muscle weakness (generalized): Secondary | ICD-10-CM | POA: Diagnosis not present

## 2022-05-01 NOTE — Therapy (Signed)
OUTPATIENT PHYSICAL THERAPY TREATMENT NOTE   Patient Name: Raymond [name] MRN: [id number] DOB:[birth year], 61 y.o., male Today's Date: 05/01/2022  PCP: [name], FNP  REFERRING PROVIDER: Newt Minion, MD   END OF SESSION:   PT End of Session - 05/01/22 0847     Visit Number 7    Number of Visits 25    Date for PT Re-Evaluation 08/01/22    Authorization Type [employer]    Authorization Time Period NO COPAY, 35 PT/OT VISITS Calendar Year Benefit Date 05/26/2021 - 05/25/2022    Authorization - Visit Number 7    Authorization - Number of Visits 35    Progress Note Due on Visit 10    PT Start Time 0845    PT Stop Time 0930    PT Time Calculation (min) 45 min    Equipment Utilized During Treatment Gait belt    Activity Tolerance Patient tolerated treatment well    Behavior During Therapy WFL for tasks assessed/performed                Past Medical History:  Diagnosis Date   Diabetes ([place])    Diabetes mellitus without complication ([place])    type 2   ESRD (end stage renal disease) ([place])    03/09/2015- patient had a kidney transplant   Hypertension    Peripheral vascular disease ([place])    Renal disorder    Renal insufficiency    Past Surgical History:  Procedure Laterality Date   ABDOMINAL AORTOGRAM W/LOWER EXTREMITY N/A 09/11/2021   Procedure: ABDOMINAL AORTOGRAM W/LOWER EXTREMITY;  Surgeon: Wellington Hampshire, MD;  Location: [facility];  Service: Cardiovascular;  Laterality: N/A;   AMPUTATION Right 09/25/2021   Procedure: RIGHT TRANSMETATARSAL AMPUTATION AND APPLY TISSUE GRAFT;  Surgeon: Newt Minion, MD;  Location: [facility];  Service: Orthopedics;  Laterality: Right;   AMPUTATION Right 11/16/2021   Procedure: RIGHT AMPUTATION BELOW KNEE WITH WOUND VAC APPLICATION;  Surgeon: Newt Minion, MD;  Location: [facility];  Service: Orthopedics;  Laterality: Right;   BONE BIOPSY Right 03/06/2021   Procedure: BONE BIOPSY;  Surgeon: Trula Slade, DPM;  Location:  [facility];  Service: Podiatry;  Laterality: Right;   CENTRAL VENOUS CATHETER INSERTION Right 01/20/2020   Procedure: INSERTION CENTRAL LINE ADULT; Tunneled central line;  Surgeon: Virl Cagey, MD;  Location: [facility];  Service: General;  Laterality: Right;   COLONOSCOPY N/A 12/05/2014   GUY:[id number] external and internal hemorrhoid/mild diverticulosis/11 polyps removed   ESOPHAGOGASTRODUODENOSCOPY N/A 12/05/2014   ZDG:LOVF duodenitis   GRAFT APPLICATION Right 64/33/2951   Procedure: GRAFT APPLICATION;  Surgeon: Trula Slade, DPM;  Location: [facility];  Service: Podiatry;  Laterality: Right;   INCISION AND DRAINAGE OF WOUND Right 08/15/2021   Procedure: IRRIGATION AND DEBRIDEMENT WOUND;  Surgeon: Landis Martins, DPM;  Location: [facility];  Service: Podiatry;  Laterality: Right;  need to make card not  a irrigation and debridement card   IR FLUORO GUIDE CV LINE RIGHT  03/01/2019   IR PERC TUN PERIT CATH WO PORT S&I /IMAG  06/27/2021   IR REMOVAL TUN CV CATH W/O FL  05/10/2019   IR REMOVAL TUN CV CATH W/O FL  02/29/2020   IR REMOVAL TUN CV CATH W/O FL  07/19/2021   IR US GUIDE VASC ACCESS RIGHT  03/01/2019   IR US GUIDE VASC ACCESS RIGHT  06/27/2021   IRRIGATION AND DEBRIDEMENT ELBOW Left 06/24/2021   Procedure: IRRIGATION AND DEBRIDEMENT ELBOW;  Surgeon:  Lovell Sheehan, MD;  Location: [facility];  Service: Orthopedics;  Laterality: Left;   KIDNEY TRANSPLANT  03/27/2015   Penile Pump Insertion     PERIPHERAL VASCULAR ATHERECTOMY  09/11/2021   Procedure: PERIPHERAL VASCULAR ATHERECTOMY;  Surgeon: Wellington Hampshire, MD;  Location: [facility];  Service: Cardiovascular;;  [facility] Lithotripsy   TEE WITHOUT CARDIOVERSION N/A 01/17/2020   Procedure: TRANSESOPHAGEAL ECHOCARDIOGRAM (TEE) WITH PROPOFOL;  Surgeon: Arnoldo Lenis, MD;  [facility];  Service: Endoscopy;  Laterality: N/A;   WOUND DEBRIDEMENT Right 03/06/2021   Procedure: DEBRIDEMENT WOUND;  Surgeon: Trula Slade,  DPM;  Location: [facility];  Service: Podiatry;  Laterality: Right;   Patient Active Problem List   Diagnosis Date Noted   Cutaneous abscess of right foot    Hyperkalemia 08/28/2021   Obesity (BMI 30-39.9) 08/28/2021   History of anemia due to chronic kidney disease 08/28/2021   Diabetic foot ulcer ([place]) 08/14/2021   Hypomagnesemia 06/25/2021   Septic arthritis of elbow, left ([place]) 06/24/2021   Peripheral vascular disease ([place]) 04/24/2020   Pyogenic inflammation of bone ([facility])    Thrombophlebitis of superficial veins of right lower extremity    Difficult intravenous access    MRSA bacteremia    Diabetic foot infection ([place])    Infected wound 01/12/2020   Critical lower limb ischemia ([place]) 02/02/2019   Personal history of noncompliance with medical treatment, presenting hazards to health 10/07/2018   Critical limb ischemia with history of revascularization of same extremity ([place]) 07/27/2018   Mixed hyperlipidemia 02/19/2018   History of renal transplant 02/19/2018   Conductive hearing loss, bilateral 08/31/2017   Nuclear sclerotic cataract of right eye 10/07/2016   Pseudophakia of left eye 10/07/2016   Hypertension due to endocrine disorder 03/04/2016   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 ([place]) 03/04/2016   Renal failure 03/04/2016   Proliferative diabetic retinopathy with macular edema associated with type 2 diabetes mellitus ([place]) 03/04/2016   Immunosuppression ([place]) 12/10/2015   Nausea vomiting and diarrhea 12/10/2015   Type 2 diabetes mellitus ([place]) 12/10/2015   Essential hypertension, benign 12/10/2015   Pancytopenia ([place]) 12/10/2015   Encounter for screening colonoscopy 11/15/2014   GERD (gastroesophageal reflux disease) 11/15/2014    REFERRING DIAG: Z99.357S (ICD-10-CM) - Below-knee amputation of right lower extremit   ONSET DATE: 04/10/2022 prosthesis delivery   THERAPY DIAG:  Unsteadiness on  feet  Other abnormalities of gait and mobility  Muscle weakness (generalized)  Wound of right lower extremity, initial encounter  Rationale for Evaluation and Treatment Rehabilitation  PERTINENT HISTORY: DM2, PVD, ESRD s/p renal transplant 03/09/2015 on immunosuppressive meds, HTN   PRECAUTIONS: None  SUBJECTIVE:  SUBJECTIVE STATEMENT:  He is seeing prosthetist today after PT. He wore prosthesis 5hrs on, 2hrs off, yesterday.    PAIN:  Are you having pain? No  OBJECTIVE: (objective measures completed at initial evaluation unless otherwise dated) POSTURE: rounded shoulders, forward head, flexed trunk , and weight shift left   LOWER EXTREMITY ROM:   ROM P:passive  A:active Right eval Left eval  Hip flexion      Hip extension      Hip abduction      Hip adduction      Hip internal rotation      Hip external rotation      Knee flexion 100*    Knee extension -5*    Ankle dorsiflexion      Ankle plantarflexion      Ankle inversion      Ankle eversion       (Blank rows = not tested)   LOWER EXTREMITY MMT:   MMT Right eval Left eval  Hip flexion 3+/5    Hip extension 3-/5    Hip abduction 3-/5    Hip adduction      Hip internal rotation      Hip external rotation      Knee flexion 3-/5    Knee extension 3/5    Ankle dorsiflexion      Ankle plantarflexion      Ankle inversion      Ankle eversion      (Blank rows = not tested)   TRANSFERS: Sit to stand: 18" chair with armrests to RW with supervision.  Stand to sit: RW to 18" chair with armrests with supervision   PROSTHETIC GAIT WITH BKA PROSTHESIS: Gait pattern: step to pattern, decreased arm swing- Right, decreased step length- Right, decreased stance time- Left, decreased stride length, decreased hip/knee flexion- Right,  circumduction- Right, Right hip hike, knee flexed in stance- Right, antalgic, lateral hip instability, trunk flexed, and abducted- Right Distance walked: 78' with RW & 20' with cane Assistive device utilized: Single point cane, Walker - 2 wheeled, and BKA prosthesis Level of assistance: SBA with RW and Min A Gait velocity: 0.89 ft/sec Comments: excessive weight bearing on RW, partial weight on prosthesis   FUNCTIONAL TESTs:  Timed up and go (TUG): 39.25sec with cane & BKA prosthesis Berg Balance Scale: 24/56     CURRENT PROSTHETIC WEAR ASSESSMENT: Patient is dependent with: skin check, residual limb care, care of non-amputated limb, prosthetic cleaning, ply sock cleaning, correct ply sock adjustment, proper wear schedule/adjustment, and proper weight-bearing schedule/adjustment Donning prosthesis: Min A Doffing prosthesis: SBA Prosthetic wear tolerance: 3 hours, 1-2x/day, 5 of 5 days since delivery Prosthetic weight bearing tolerance: 5 minutes Edema: pitting. Residual limb condition: 20m scab wound on medial incision, dry skin, normal color & temperature, cylindrical shape Prosthetic description: silicon liner with pin lock suspension, total contact socket, hydraulic ankle / K3 foot K code/activity level with prosthetic use: Level 3       TODAY'S TREATMENT:  DATE:  05/01/2022: Prosthetic Training with TTA prosthesis: PT recommended return to 4hrs on, 2 hrs off, 3x/day with changes prosthetist making today. Pt verbalized understanding.  PT advised at prosthetist appt to ambulate without device for alignment making sure to not abduct prosthesis.  Prosthetist will probable put flexible inner socket back inside socket which will change his ply sock fit. He will need to continue to adjust socks as he pushes fluid out with weight bearing. Pt & wife verbalized  understanding.  Pt amb with single point cane 400' working on scanning, changing paces, negotiating obstacles weaving and 90* turns around mat table with verbal cues.   Pt amb 100' without device with supervision.  PT recommended walking program of short walks (room to room) without device increasing frequency during day, long walk (max tolerance) 1x/day with cane and medium distance initially without device then using cane when notices limbing or soreness. Pt & wife verbalized understanding.  Pt neg 6" curb with cane with PT cues on technique with supervision. Pt neg 12* ramp with cane  with PT cues on technique with supervision.  Neuromuscular Re-education: Standing lower extremity resistance with red Thera-Band kicks forward, abduction, adduction and extension initial 5 reps with chair back support and second 5 reps without support for all 4 directions. Pt verbalized understanding as HEP.   04/30/2022: Prosthetic Training with TTA prosthesis: PT recommended checking ply sock in standing with back to wall for balance. Pt verbalized understanding.  PT recommended increasing wear to 5 hours on and 2 hours off rotating during awake hours with last time until bed time.  PT demo & verbal cues on decreasing prosthetic abduction & LLE rotation of heel onto line of progression. Pt amb along line for visual reference, then 4 correct 2 abd along line for proprioception. Progressed to no line 4 correct 2 abd for proprioception. Finally to correct if feels wrong.  PT demo & verbal cues on weight shift forward with LLE heel rise in terminal stance. Pt performed in //bars. PT demo & cues on cane placement. Pt return demo understanding.  PT demo & verbal cues on side stepping then using similar push/pull weight shift for 90* turn rt & lt. Pt return demo understanding.   04/28/2022: Prosthetic Training with TTA prosthesis: PT recommended continuing wear it 4 hours on and 2 hours off. PT demo and instructed in  driving with right lower extremity amputation.  Patient verbalized understanding. PT demoed how to pick up items off the floor with TTA prosthesis.  Patient able to pick up a washcloth on the floor 5 reps with supervision. PT demo and verbal cues on pelvic orientation for prosthetic gait.  Patient ambulated 150 feet with cane stand-alone tip with tactile and verbal cues for pelvic orientation and upright posture. PT worked on scanning and maintaining path/pace.  Patient ambulated 300 feet with cane stand-alone tip with tactile cues scanning right/left, up/down and diagonals. PT instructed with demo and verbal cues on stepping over obstacles.  Patient able to step over 4 inch high bar with a cane with min guard x 5 reps.  Patient improved with able to do it in motion with a delay but not having to come to a complete stop. Patient worked on balance for ambulation in the dark but ambulating 3 steps with eyes closed 3 and 3 steps with eyes open for 100 feet.  Patient requires min assist  Neuromuscular reeducation working on upright posture and balance reactions: Standing lower extremity resistance with red  Thera-Band kicks forward, abduction, adduction and extension initial 5 reps with cane and chair back support and second 5 reps with cane only with min assist for all 4 directions.    HOME EXERCISE PROGRAM:  Do each exercise 1-2  times per day Do each exercise 5-10 repetitions Hold each exercise for 2 seconds to feel your location  AT [place].  Try to find this position when standing still for activities.   USE TAPE ON FLOOR TO MARK THE MIDLINE POSITION which is even with middle of sink.  You also should try to feel with your limb pressure in socket.  You are trying to feel with limb what you used to feel with the bottom of your foot.  Side to Side Shift: Moving your hips only (not shoulders): move weight onto your left leg,  HOLD/FEEL pressure in socket.  Move back to equal weight on each leg, HOLD/FEEL pressure in socket. Move weight onto your right leg, HOLD/FEEL pressure in socket. Move back to equal weight on each leg, HOLD/FEEL pressure in socket. Repeat.  Start with both hands on sink, progress to hand on prosthetic side only, then no hands.  Front to Back Shift: Moving your hips only (not shoulders): move your weight forward onto your toes, HOLD/FEEL pressure in socket. Move your weight back to equal Flat Foot on both legs, HOLD/FEEL  pressure in socket. Move your weight back onto your heels, HOLD/FEEL  pressure in socket. Move your weight back to equal on both legs, HOLD/FEEL  pressure in socket. Repeat.  Start with both hands on sink, progress to hand on prosthetic side only, then no hands.  Moving Cones / Cups: With equal weight on each leg: Hold on with one hand the first time, then progress to no hand supports. Move cups from one side of sink to the other. Place cups ~2" out of your reach, progress to 10" beyond reach.  Place one hand in middle of sink and reach with other hand. Do both arms.  Then hover one hand and move cups with other hand.  Overhead/Upward Reaching: alternated reaching up to top cabinets or ceiling if no cabinets present. Keep equal weight on each leg. Start with one hand support on counter while other hand reaches and progress to no hand support with reaching.  ace one hand in middle of sink and reach with other hand. Do both arms.  Then hover one hand and move cups with other hand.  5.   Looking Over Shoulders: With equal weight on each leg: alternate turning to look over your shoulders with one hand support on counter as needed.  Start with head motions only to look in front of shoulder, then even with shoulder and progress to looking behind you. To look to side, move head /eyes, then shoulder on side looking pulls back, shift more weight to side looking and pull hip back. Place one hand in middle  of sink and let go with other hand so your shoulder can pull back. Switch hands to look other way.   Then hover one hand and look over shoulder. If looking right, use left hand at sink. If looking left, use right hand at sink. 6.  Stepping with leg that is not amputated:  Move items under cabinet out of your way. Shift your hips/pelvis so weight on prosthesis. Tighten muscles in hip on prosthetic side.  SLOWLY step other leg so front of  foot is in cabinet. Then step back to floor.      ASSESSMENT:   CLINICAL IMPRESSION: Patient has improved prosthetic gait with cane for community activities and no assistive device for household activities.  His balance is improving.  Pt reports pleased with progress.    OBJECTIVE IMPAIRMENTS: Abnormal gait, decreased activity tolerance, decreased balance, decreased endurance, decreased knowledge of condition, decreased knowledge of use of DME, decreased mobility, decreased ROM, decreased strength, increased edema, impaired flexibility, postural dysfunction, prosthetic dependency , obesity, and wound on residual limb .    ACTIVITY LIMITATIONS: carrying, lifting, bending, standing, squatting, stairs, transfers, locomotion level, and prosthetic use   PARTICIPATION LIMITATIONS: meal prep, cleaning, laundry, driving, community activity, occupation, and yard work   PERSONAL FACTORS: Fitness, Time since onset of injury/illness/exacerbation, and 3+ comorbidities: see PMH  are also affecting patient's functional outcome.    REHAB POTENTIAL: Good   CLINICAL DECISION MAKING: Evolving/moderate complexity   EVALUATION COMPLEXITY: Moderate     GOALS: Goals reviewed with patient? Yes   SHORT TERM GOALS: Target date: 05/16/2022   Patient donnes prosthesis modified independent & verbalizes proper cleaning. Baseline: SEE OBJECTIVE DATA Goal status: INITIAL 2.  Patient tolerates prosthesis >12 hrs total /day without skin issues or limb pain after standing. Baseline:  SEE OBJECTIVE DATA Goal status: INITIAL   3. Berg Balance >36/56 Baseline: SEE OBJECTIVE DATA Goal status: INITIAL   4. Patient ambulates 200' with cane & prosthesis with supervision. Baseline: SEE OBJECTIVE DATA Goal status: INITIAL   5. Patient negotiates ramps & curbs with cane & prosthesis with minA. Baseline: SEE OBJECTIVE DATA Goal status: INITIAL   LONG TERM GOALS: Target date: 07/31/2021   Patient demonstrates & verbalized understanding of prosthetic care to enable safe utilization of prosthesis. Baseline: SEE OBJECTIVE DATA Goal status: INITIAL   Patient tolerates prosthesis wear >90% of awake hours without skin or limb pain issues. Baseline: SEE OBJECTIVE DATA Goal status: INITIAL   Functional Gait Assessment >22/30 to indicate lower fall risk Baseline: SEE OBJECTIVE DATA Goal status: INITIAL   Patient ambulates >500' with prosthesis only independently Baseline: SEE OBJECTIVE DATA Goal status: INITIAL   Patient negotiates ramps, curbs & stairs with single rail with prosthesis only independently. Baseline: SEE OBJECTIVE DATA Goal status: INITIAL   Patient demonstrates & verbalizes lifting, carrying, pushing, pulling with prosthesis only safely. Baseline: SEE OBJECTIVE DATA Goal status: INITIAL     PLAN:   PT FREQUENCY: 3x/week for 5 weeks, then 1-2 x/wk for 10 weeks   PT DURATION: other: 15 weeks   PLANNED INTERVENTIONS: Therapeutic exercises, Therapeutic activity, Neuromuscular re-education, Balance training, Gait training, Patient/Family education, Self Care, Stair training, Vestibular training, Prosthetic training, DME instructions, and physical performance testing   PLAN FOR NEXT SESSION: continue to work on work on ramps & curbs with cane, stairs with 2 rails using RLE,  continue prosthetic gait with cane, balance activities   Jamey Reas, PT, DPT 05/01/2022, 12:15 PM

## 2022-05-05 ENCOUNTER — Ambulatory Visit (INDEPENDENT_AMBULATORY_CARE_PROVIDER_SITE_OTHER): Payer: 59 | Admitting: Physical Therapy

## 2022-05-05 ENCOUNTER — Encounter: Payer: Self-pay | Admitting: Physical Therapy

## 2022-05-05 DIAGNOSIS — R2681 Unsteadiness on feet: Secondary | ICD-10-CM | POA: Diagnosis not present

## 2022-05-05 DIAGNOSIS — S81801A Unspecified open wound, right lower leg, initial encounter: Secondary | ICD-10-CM | POA: Diagnosis not present

## 2022-05-05 DIAGNOSIS — M6281 Muscle weakness (generalized): Secondary | ICD-10-CM

## 2022-05-05 DIAGNOSIS — R2689 Other abnormalities of gait and mobility: Secondary | ICD-10-CM

## 2022-05-05 NOTE — Therapy (Signed)
OUTPATIENT PHYSICAL THERAPY TREATMENT NOTE   Patient Name: Raymond [name] MRN: [id number] DOB:1960-10-23, 61 y.o., male Today's Date: 05/05/2022  PCP: [name], FNP  REFERRING PROVIDER: Newt Minion, MD   END OF SESSION:   PT End of Session - 05/05/22 0859     Visit Number 8    Number of Visits 25    Date for PT Re-Evaluation 08/01/22    Authorization Type [employer]    Authorization Time Period NO COPAY, 35 PT/OT VISITS Calendar Year Benefit Date 05/26/2021 - 05/25/2022    Authorization - Visit Number 8    Authorization - Number of Visits 35    Progress Note Due on Visit 10    PT Start Time 0857    PT Stop Time 0914    PT Time Calculation (min) 17 min    Equipment Utilized During Treatment Gait belt    Activity Tolerance Patient tolerated treatment well    Behavior During Therapy WFL for tasks assessed/performed                 Past Medical History:  Diagnosis Date   Diabetes ([place])    Diabetes mellitus without complication ([place])    type 2   ESRD (end stage renal disease) ([place])    03/09/2015- patient had a kidney transplant   Hypertension    Peripheral vascular disease ([place])    Renal disorder    Renal insufficiency    Past Surgical History:  Procedure Laterality Date   ABDOMINAL AORTOGRAM W/LOWER EXTREMITY N/A 09/11/2021   Procedure: ABDOMINAL AORTOGRAM W/LOWER EXTREMITY;  Surgeon: Wellington Hampshire, MD;  Location: [facility];  Service: Cardiovascular;  Laterality: N/A;   AMPUTATION Right 09/25/2021   Procedure: RIGHT TRANSMETATARSAL AMPUTATION AND APPLY TISSUE GRAFT;  Surgeon: Newt Minion, MD;  Location: [facility];  Service: Orthopedics;  Laterality: Right;   AMPUTATION Right 11/16/2021   Procedure: RIGHT AMPUTATION BELOW KNEE WITH WOUND VAC APPLICATION;  Surgeon: Newt Minion, MD;  Location: [facility];  Service: Orthopedics;  Laterality: Right;   BONE BIOPSY Right 03/06/2021   Procedure: BONE BIOPSY;  Surgeon: Trula Slade, DPM;   Location: [facility];  Service: Podiatry;  Laterality: Right;   CENTRAL VENOUS CATHETER INSERTION Right 01/20/2020   Procedure: INSERTION CENTRAL LINE ADULT; Tunneled central line;  Surgeon: Virl Cagey, MD;  Location: [facility];  Service: General;  Laterality: Right;   COLONOSCOPY N/A 12/05/2014   WUJ:[id number] external and internal hemorrhoid/mild diverticulosis/11 polyps removed   ESOPHAGOGASTRODUODENOSCOPY N/A 12/05/2014   GNF:AOZH duodenitis   GRAFT APPLICATION Right 08/65/7846   Procedure: GRAFT APPLICATION;  Surgeon: Trula Slade, DPM;  Location: [facility];  Service: Podiatry;  Laterality: Right;   INCISION AND DRAINAGE OF WOUND Right 08/15/2021   Procedure: IRRIGATION AND DEBRIDEMENT WOUND;  Surgeon: Landis Martins, DPM;  Location: [facility];  Service: Podiatry;  Laterality: Right;  need to make card not  a irrigation and debridement card   IR FLUORO GUIDE CV LINE RIGHT  03/01/2019   IR PERC TUN PERIT CATH WO PORT S&I /IMAG  06/27/2021   IR REMOVAL TUN CV CATH W/O FL  05/10/2019   IR REMOVAL TUN CV CATH W/O FL  02/29/2020   IR REMOVAL TUN CV CATH W/O FL  07/19/2021   IR US GUIDE VASC ACCESS RIGHT  03/01/2019   IR US GUIDE VASC ACCESS RIGHT  06/27/2021   IRRIGATION AND DEBRIDEMENT ELBOW Left 06/24/2021   Procedure: IRRIGATION AND DEBRIDEMENT ELBOW;  Surgeon: Lovell Sheehan, MD;  Location: [facility];  Service: Orthopedics;  Laterality: Left;   KIDNEY TRANSPLANT  03/27/2015   Penile Pump Insertion     PERIPHERAL VASCULAR ATHERECTOMY  09/11/2021   Procedure: PERIPHERAL VASCULAR ATHERECTOMY;  Surgeon: Wellington Hampshire, MD;  Location: [facility];  Service: Cardiovascular;;  Shockwave Lithotripsy   TEE WITHOUT CARDIOVERSION N/A 01/17/2020   Procedure: TRANSESOPHAGEAL ECHOCARDIOGRAM (TEE) WITH PROPOFOL;  Surgeon: Arnoldo Lenis, MD;  [facility];  Service: Endoscopy;  Laterality: N/A;   WOUND DEBRIDEMENT Right 03/06/2021   Procedure: DEBRIDEMENT WOUND;  Surgeon: Trula Slade, DPM;  Location: [facility];  Service: Podiatry;  Laterality: Right;   Patient Active Problem List   Diagnosis Date Noted   Cutaneous abscess of right foot    Hyperkalemia 08/28/2021   Obesity (BMI 30-39.9) 08/28/2021   History of anemia due to chronic kidney disease 08/28/2021   Diabetic foot ulcer ([place]) 08/14/2021   Hypomagnesemia 06/25/2021   Septic arthritis of elbow, left ([place]) 06/24/2021   Peripheral vascular disease ([place]) 04/24/2020   Pyogenic inflammation of bone ([facility])    Thrombophlebitis of superficial veins of right lower extremity    Difficult intravenous access    MRSA bacteremia    Diabetic foot infection ([place])    Infected wound 01/12/2020   Critical lower limb ischemia ([place]) 02/02/2019   Personal history of noncompliance with medical treatment, presenting hazards to health 10/07/2018   Critical limb ischemia with history of revascularization of same extremity ([place]) 07/27/2018   Mixed hyperlipidemia 02/19/2018   History of renal transplant 02/19/2018   Conductive hearing loss, bilateral 08/31/2017   Nuclear sclerotic cataract of right eye 10/07/2016   Pseudophakia of left eye 10/07/2016   Hypertension due to endocrine disorder 03/04/2016   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 ([place]) 03/04/2016   Renal failure 03/04/2016   Proliferative diabetic retinopathy with macular edema associated with type 2 diabetes mellitus ([place]) 03/04/2016   Immunosuppression ([place]) 12/10/2015   Nausea vomiting and diarrhea 12/10/2015   Type 2 diabetes mellitus ([place]) 12/10/2015   Essential hypertension, benign 12/10/2015   Pancytopenia ([place]) 12/10/2015   Encounter for screening colonoscopy 11/15/2014   GERD (gastroesophageal reflux disease) 11/15/2014    REFERRING DIAG: Z61.096E (ICD-10-CM) - Below-knee amputation of right lower extremit   ONSET DATE: 04/10/2022 prosthesis delivery   THERAPY DIAG:  Unsteadiness  on feet  Other abnormalities of gait and mobility  Muscle weakness (generalized)  Wound of right lower extremity, initial encounter  Rationale for Evaluation and Treatment Rehabilitation  PERTINENT HISTORY: DM2, PVD, ESRD s/p renal transplant 03/09/2015 on immunosuppressive meds, HTN   PRECAUTIONS: None  SUBJECTIVE:  SUBJECTIVE STATEMENT:  Prosthetist added flexible insert back into socket and now he has difficulty getting it on even without socks. He was late to PT session today because he couldn't get prosthesis on limb.   PAIN:  Are you having pain? Residual limb pain 7-8/10 soreness from wearing prosthesis without ability to seat limb into socket.   OBJECTIVE: (objective measures completed at initial evaluation unless otherwise dated) POSTURE: rounded shoulders, forward head, flexed trunk , and weight shift left   LOWER EXTREMITY ROM:   ROM P:passive  A:active Right eval Left eval  Hip flexion      Hip extension      Hip abduction      Hip adduction      Hip internal rotation      Hip external rotation      Knee flexion 100*    Knee extension -5*    Ankle dorsiflexion      Ankle plantarflexion      Ankle inversion      Ankle eversion       (Blank rows = not tested)   LOWER EXTREMITY MMT:   MMT Right eval Left eval  Hip flexion 3+/5    Hip extension 3-/5    Hip abduction 3-/5    Hip adduction      Hip internal rotation      Hip external rotation      Knee flexion 3-/5    Knee extension 3/5    Ankle dorsiflexion      Ankle plantarflexion      Ankle inversion      Ankle eversion      (Blank rows = not tested)   TRANSFERS: Sit to stand: 18" chair with armrests to RW with supervision.  Stand to sit: RW to 18" chair with armrests with supervision   PROSTHETIC GAIT WITH BKA  PROSTHESIS: Gait pattern: step to pattern, decreased arm swing- Right, decreased step length- Right, decreased stance time- Left, decreased stride length, decreased hip/knee flexion- Right, circumduction- Right, Right hip hike, knee flexed in stance- Right, antalgic, lateral hip instability, trunk flexed, and abducted- Right Distance walked: 60' with RW & 20' with cane Assistive device utilized: Single point cane, Walker - 2 wheeled, and BKA prosthesis Level of assistance: SBA with RW and Min A Gait velocity: 0.89 ft/sec Comments: excessive weight bearing on RW, partial weight on prosthesis   FUNCTIONAL TESTs:  Timed up and go (TUG): 39.25sec with cane & BKA prosthesis Berg Balance Scale: 24/56     CURRENT PROSTHETIC WEAR ASSESSMENT: Patient is dependent with: skin check, residual limb care, care of non-amputated limb, prosthetic cleaning, ply sock cleaning, correct ply sock adjustment, proper wear schedule/adjustment, and proper weight-bearing schedule/adjustment Donning prosthesis: Min A Doffing prosthesis: SBA Prosthetic wear tolerance: 3 hours, 1-2x/day, 5 of 5 days since delivery Prosthetic weight bearing tolerance: 5 minutes Edema: pitting. Residual limb condition: 54m scab wound on medial incision, dry skin, normal color & temperature, cylindrical shape Prosthetic description: silicon liner with pin lock suspension, total contact socket, hydraulic ankle / K3 foot K code/activity level with prosthetic use: Level 3       TODAY'S TREATMENT:  DATE:  05/05/2022: Prosthetic Training with TTA prosthesis: Pt arrived with increased antalgic pattern with cane.  He had flexible liner with no socks and his limb was >1" out of socket. PT demo & verbal cues on how to remove flexible socket. Pt verbalized understanding.  Pt redonned with 5-ply sock without flexible  liner. Pt amb with cane with continued antalgic pattern with high residual limb pain. PT recommended wearing without flexible liner 4 hrs on, 2hrs off during day. Limiting walking and using cane or RW when he does need to walk today to let his limb calm down. Pt verbalized understanding.   05/01/2022: Prosthetic Training with TTA prosthesis: PT recommended return to 4hrs on, 2 hrs off, 3x/day with changes prosthetist making today. Pt verbalized understanding.  PT advised at prosthetist appt to ambulate without device for alignment making sure to not abduct prosthesis.  Prosthetist will probable put flexible inner socket back inside socket which will change his ply sock fit. He will need to continue to adjust socks as he pushes fluid out with weight bearing. Pt & wife verbalized understanding.  Pt amb with single point cane 400' working on scanning, changing paces, negotiating obstacles weaving and 90* turns around mat table with verbal cues.   Pt amb 100' without device with supervision.  PT recommended walking program of short walks (room to room) without device increasing frequency during day, long walk (max tolerance) 1x/day with cane and medium distance initially without device then using cane when notices limbing or soreness. Pt & wife verbalized understanding.  Pt neg 6" curb with cane with PT cues on technique with supervision. Pt neg 12* ramp with cane  with PT cues on technique with supervision.  Neuromuscular Re-education: Standing lower extremity resistance with red Thera-Band kicks forward, abduction, adduction and extension initial 5 reps with chair back support and second 5 reps without support for all 4 directions. Pt verbalized understanding as HEP.   04/30/2022: Prosthetic Training with TTA prosthesis: PT recommended checking ply sock in standing with back to wall for balance. Pt verbalized understanding.  PT recommended increasing wear to 5 hours on and 2 hours off rotating during  awake hours with last time until bed time.  PT demo & verbal cues on decreasing prosthetic abduction & LLE rotation of heel onto line of progression. Pt amb along line for visual reference, then 4 correct 2 abd along line for proprioception. Progressed to no line 4 correct 2 abd for proprioception. Finally to correct if feels wrong.  PT demo & verbal cues on weight shift forward with LLE heel rise in terminal stance. Pt performed in //bars. PT demo & cues on cane placement. Pt return demo understanding.  PT demo & verbal cues on side stepping then using similar push/pull weight shift for 90* turn rt & lt. Pt return demo understanding.     HOME EXERCISE PROGRAM:  Do each exercise 1-2  times per day Do each exercise 5-10 repetitions Hold each exercise for 2 seconds to feel your location  AT [place].  Try to find this position when standing still for activities.   USE TAPE ON FLOOR TO MARK THE MIDLINE POSITION which is even with middle of sink.  You also should try to feel with your limb pressure in socket.  You are trying to feel with limb what you used to feel with the bottom of your foot.  Side to Side Shift: Moving  your hips only (not shoulders): move weight onto your left leg, HOLD/FEEL pressure in socket.  Move back to equal weight on each leg, HOLD/FEEL pressure in socket. Move weight onto your right leg, HOLD/FEEL pressure in socket. Move back to equal weight on each leg, HOLD/FEEL pressure in socket. Repeat.  Start with both hands on sink, progress to hand on prosthetic side only, then no hands.  Front to Back Shift: Moving your hips only (not shoulders): move your weight forward onto your toes, HOLD/FEEL pressure in socket. Move your weight back to equal Flat Foot on both legs, HOLD/FEEL  pressure in socket. Move your weight back onto your heels, HOLD/FEEL  pressure in socket. Move your weight back to equal on both  legs, HOLD/FEEL  pressure in socket. Repeat.  Start with both hands on sink, progress to hand on prosthetic side only, then no hands.  Moving Cones / Cups: With equal weight on each leg: Hold on with one hand the first time, then progress to no hand supports. Move cups from one side of sink to the other. Place cups ~2" out of your reach, progress to 10" beyond reach.  Place one hand in middle of sink and reach with other hand. Do both arms.  Then hover one hand and move cups with other hand.  Overhead/Upward Reaching: alternated reaching up to top cabinets or ceiling if no cabinets present. Keep equal weight on each leg. Start with one hand support on counter while other hand reaches and progress to no hand support with reaching.  ace one hand in middle of sink and reach with other hand. Do both arms.  Then hover one hand and move cups with other hand.  5.   Looking Over Shoulders: With equal weight on each leg: alternate turning to look over your shoulders with one hand support on counter as needed.  Start with head motions only to look in front of shoulder, then even with shoulder and progress to looking behind you. To look to side, move head /eyes, then shoulder on side looking pulls back, shift more weight to side looking and pull hip back. Place one hand in middle of sink and let go with other hand so your shoulder can pull back. Switch hands to look other way.   Then hover one hand and look over shoulder. If looking right, use left hand at sink. If looking left, use right hand at sink. 6.  Stepping with leg that is not amputated:  Move items under cabinet out of your way. Shift your hips/pelvis so weight on prosthesis. Tighten muscles in hip on prosthetic side.  SLOWLY step other leg so front of foot is in cabinet. Then step back to floor.      ASSESSMENT:   CLINICAL IMPRESSION: Pt's residual limb was too sore for standing or gait activities today.  He seems to understand how to remove flexible  liner.    OBJECTIVE IMPAIRMENTS: Abnormal gait, decreased activity tolerance, decreased balance, decreased endurance, decreased knowledge of condition, decreased knowledge of use of DME, decreased mobility, decreased ROM, decreased strength, increased edema, impaired flexibility, postural dysfunction, prosthetic dependency , obesity, and wound on residual limb .    ACTIVITY LIMITATIONS: carrying, lifting, bending, standing, squatting, stairs, transfers, locomotion level, and prosthetic use   PARTICIPATION LIMITATIONS: meal prep, cleaning, laundry, driving, community activity, occupation, and yard work   PERSONAL FACTORS: Fitness, Time since onset of injury/illness/exacerbation, and 3+ comorbidities: see PMH  are also affecting patient's functional outcome.  REHAB POTENTIAL: Good   CLINICAL DECISION MAKING: Evolving/moderate complexity   EVALUATION COMPLEXITY: Moderate     GOALS: Goals reviewed with patient? Yes   SHORT TERM GOALS: Target date: 05/16/2022   Patient donnes prosthesis modified independent & verbalizes proper cleaning. Baseline: SEE OBJECTIVE DATA Goal status: INITIAL 2.  Patient tolerates prosthesis >12 hrs total /day without skin issues or limb pain after standing. Baseline: SEE OBJECTIVE DATA Goal status: INITIAL   3. Berg Balance >36/56 Baseline: SEE OBJECTIVE DATA Goal status: INITIAL   4. Patient ambulates 200' with cane & prosthesis with supervision. Baseline: SEE OBJECTIVE DATA Goal status: INITIAL   5. Patient negotiates ramps & curbs with cane & prosthesis with minA. Baseline: SEE OBJECTIVE DATA Goal status: INITIAL   LONG TERM GOALS: Target date: 07/31/2021   Patient demonstrates & verbalized understanding of prosthetic care to enable safe utilization of prosthesis. Baseline: SEE OBJECTIVE DATA Goal status: INITIAL   Patient tolerates prosthesis wear >90% of awake hours without skin or limb pain issues. Baseline: SEE OBJECTIVE DATA Goal status:  INITIAL   Functional Gait Assessment >22/30 to indicate lower fall risk Baseline: SEE OBJECTIVE DATA Goal status: INITIAL   Patient ambulates >500' with prosthesis only independently Baseline: SEE OBJECTIVE DATA Goal status: INITIAL   Patient negotiates ramps, curbs & stairs with single rail with prosthesis only independently. Baseline: SEE OBJECTIVE DATA Goal status: INITIAL   Patient demonstrates & verbalizes lifting, carrying, pushing, pulling with prosthesis only safely. Baseline: SEE OBJECTIVE DATA Goal status: INITIAL     PLAN:   PT FREQUENCY: 3x/week for 5 weeks, then 1-2 x/wk for 10 weeks   PT DURATION: other: 15 weeks   PLANNED INTERVENTIONS: Therapeutic exercises, Therapeutic activity, Neuromuscular re-education, Balance training, Gait training, Patient/Family education, Self Care, Stair training, Vestibular training, Prosthetic training, DME instructions, and physical performance testing   PLAN FOR NEXT SESSION: check on limb pain, continue to work on work on ramps & curbs with cane, stairs with 2 rails using RLE,  continue prosthetic gait with cane, balance activities   Jamey Reas, PT, DPT 05/05/2022, 9:19 AM

## 2022-05-06 ENCOUNTER — Encounter: Payer: 59 | Admitting: Physical Therapy

## 2022-05-06 NOTE — Therapy (Incomplete)
OUTPATIENT PHYSICAL THERAPY TREATMENT NOTE   Patient Name: Raymond [name] MRN: [id number] DOB:12/05/1960, 61 y.o., male Today's Date: 05/05/2022  PCP: [name], FNP  REFERRING PROVIDER: Newt Minion, MD   END OF SESSION:   PT End of Session - 05/05/22 0859     Visit Number 8    Number of Visits 25    Date for PT Re-Evaluation 08/01/22    Authorization Type [employer]    Authorization Time Period NO COPAY, 35 PT/OT VISITS Calendar Year Benefit Date 05/26/2021 - 05/25/2022    Authorization - Visit Number 8    Authorization - Number of Visits 35    Progress Note Due on Visit 10    PT Start Time 0857    PT Stop Time 0914    PT Time Calculation (min) 17 min    Equipment Utilized During Treatment Gait belt    Activity Tolerance Patient tolerated treatment well    Behavior During Therapy WFL for tasks assessed/performed                 Past Medical History:  Diagnosis Date   Diabetes ([place])    Diabetes mellitus without complication ([place])    type 2   ESRD (end stage renal disease) ([place])    03/09/2015- patient had a kidney transplant   Hypertension    Peripheral vascular disease ([place])    Renal disorder    Renal insufficiency    Past Surgical History:  Procedure Laterality Date   ABDOMINAL AORTOGRAM W/LOWER EXTREMITY N/A 09/11/2021   Procedure: ABDOMINAL AORTOGRAM W/LOWER EXTREMITY;  Surgeon: Wellington Hampshire, MD;  Location: [facility];  Service: Cardiovascular;  Laterality: N/A;   AMPUTATION Right 09/25/2021   Procedure: RIGHT TRANSMETATARSAL AMPUTATION AND APPLY TISSUE GRAFT;  Surgeon: Newt Minion, MD;  Location: [facility];  Service: Orthopedics;  Laterality: Right;   AMPUTATION Right 11/16/2021   Procedure: RIGHT AMPUTATION BELOW KNEE WITH WOUND VAC APPLICATION;  Surgeon: Newt Minion, MD;  Location: [facility];  Service: Orthopedics;  Laterality: Right;   BONE BIOPSY Right 03/06/2021   Procedure: BONE BIOPSY;  Surgeon: Trula Slade, DPM;   Location: [facility];  Service: Podiatry;  Laterality: Right;   CENTRAL VENOUS CATHETER INSERTION Right 01/20/2020   Procedure: INSERTION CENTRAL LINE ADULT; Tunneled central line;  Surgeon: Virl Cagey, MD;  Location: [facility];  Service: General;  Laterality: Right;   COLONOSCOPY N/A 12/05/2014   [id number] external and internal hemorrhoid/mild diverticulosis/11 polyps removed   ESOPHAGOGASTRODUODENOSCOPY N/A 12/05/2014   [id number] duodenitis   GRAFT APPLICATION Right 56/38/7564   Procedure: GRAFT APPLICATION;  Surgeon: Trula Slade, DPM;  Location: [facility];  Service: Podiatry;  Laterality: Right;   INCISION AND DRAINAGE OF WOUND Right 08/15/2021   Procedure: IRRIGATION AND DEBRIDEMENT WOUND;  Surgeon: Landis Martins, DPM;  Location: [facility];  Service: Podiatry;  Laterality: Right;  need to make card not  a irrigation and debridement card   IR FLUORO GUIDE CV LINE RIGHT  03/01/2019   IR PERC TUN PERIT CATH WO PORT S&I /IMAG  06/27/2021   IR REMOVAL TUN CV CATH W/O FL  05/10/2019   IR REMOVAL TUN CV CATH W/O FL  02/29/2020   IR REMOVAL TUN CV CATH W/O FL  07/19/2021   IR US GUIDE VASC ACCESS RIGHT  03/01/2019   IR US GUIDE VASC ACCESS RIGHT  06/27/2021   IRRIGATION AND DEBRIDEMENT ELBOW Left 06/24/2021   Procedure: IRRIGATION AND DEBRIDEMENT ELBOW;  Surgeon: Lovell Sheehan, MD;  Location: [facility];  Service: Orthopedics;  Laterality: Left;   KIDNEY TRANSPLANT  03/27/2015   Penile Pump Insertion     PERIPHERAL VASCULAR ATHERECTOMY  09/11/2021   Procedure: PERIPHERAL VASCULAR ATHERECTOMY;  Surgeon: Wellington Hampshire, MD;  Location: [facility];  Service: Cardiovascular;;  Shockwave Lithotripsy   TEE WITHOUT CARDIOVERSION N/A 01/17/2020   Procedure: TRANSESOPHAGEAL ECHOCARDIOGRAM (TEE) WITH PROPOFOL;  Surgeon: Arnoldo Lenis, MD;  [facility];  Service: Endoscopy;  Laterality: N/A;   WOUND DEBRIDEMENT Right 03/06/2021   Procedure: DEBRIDEMENT WOUND;  Surgeon: Trula Slade, DPM;  Location: [facility];  Service: Podiatry;  Laterality: Right;   Patient Active Problem List   Diagnosis Date Noted   Cutaneous abscess of right foot    Hyperkalemia 08/28/2021   Obesity (BMI 30-39.9) 08/28/2021   History of anemia due to chronic kidney disease 08/28/2021   Diabetic foot ulcer ([place]) 08/14/2021   Hypomagnesemia 06/25/2021   Septic arthritis of elbow, left ([place]) 06/24/2021   Peripheral vascular disease ([place]) 04/24/2020   Pyogenic inflammation of bone ([facility])    Thrombophlebitis of superficial veins of right lower extremity    Difficult intravenous access    MRSA bacteremia    Diabetic foot infection ([place])    Infected wound 01/12/2020   Critical lower limb ischemia ([place]) 02/02/2019   Personal history of noncompliance with medical treatment, presenting hazards to health 10/07/2018   Critical limb ischemia with history of revascularization of same extremity ([place]) 07/27/2018   Mixed hyperlipidemia 02/19/2018   History of renal transplant 02/19/2018   Conductive hearing loss, bilateral 08/31/2017   Nuclear sclerotic cataract of right eye 10/07/2016   Pseudophakia of left eye 10/07/2016   Hypertension due to endocrine disorder 03/04/2016   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 ([place]) 03/04/2016   Renal failure 03/04/2016   Proliferative diabetic retinopathy with macular edema associated with type 2 diabetes mellitus ([place]) 03/04/2016   Immunosuppression ([place]) 12/10/2015   Nausea vomiting and diarrhea 12/10/2015   Type 2 diabetes mellitus ([place]) 12/10/2015   Essential hypertension, benign 12/10/2015   Pancytopenia ([place]) 12/10/2015   Encounter for screening colonoscopy 11/15/2014   GERD (gastroesophageal reflux disease) 11/15/2014    REFERRING DIAG: Z36.644I (ICD-10-CM) - Below-knee amputation of right lower extremit   ONSET DATE: 04/10/2022 prosthesis delivery   THERAPY DIAG:  Unsteadiness  on feet  Other abnormalities of gait and mobility  Muscle weakness (generalized)  Wound of right lower extremity, initial encounter  Rationale for Evaluation and Treatment Rehabilitation  PERTINENT HISTORY: DM2, PVD, ESRD s/p renal transplant 03/09/2015 on immunosuppressive meds, HTN   PRECAUTIONS: None  SUBJECTIVE:  SUBJECTIVE STATEMENT:  *** Prosthetist added flexible insert back into socket and now he has difficulty getting it on even without socks. He was late to PT session today because he couldn't get prosthesis on limb.   PAIN:  Are you having pain? Residual limb pain *** 7-8/10 soreness from wearing prosthesis without ability to seat limb into socket.   OBJECTIVE: (objective measures completed at initial evaluation unless otherwise dated) POSTURE: rounded shoulders, forward head, flexed trunk , and weight shift left   LOWER EXTREMITY ROM:   ROM P:passive  A:active Right eval Left eval  Hip flexion      Hip extension      Hip abduction      Hip adduction      Hip internal rotation      Hip external rotation      Knee flexion 100*    Knee extension -5*    Ankle dorsiflexion      Ankle plantarflexion      Ankle inversion      Ankle eversion       (Blank rows = not tested)   LOWER EXTREMITY MMT:   MMT Right eval Left eval  Hip flexion 3+/5    Hip extension 3-/5    Hip abduction 3-/5    Hip adduction      Hip internal rotation      Hip external rotation      Knee flexion 3-/5    Knee extension 3/5    Ankle dorsiflexion      Ankle plantarflexion      Ankle inversion      Ankle eversion      (Blank rows = not tested)   TRANSFERS: Sit to stand: 18" chair with armrests to RW with supervision.  Stand to sit: RW to 18" chair with armrests with supervision   PROSTHETIC GAIT  WITH BKA PROSTHESIS: Gait pattern: step to pattern, decreased arm swing- Right, decreased step length- Right, decreased stance time- Left, decreased stride length, decreased hip/knee flexion- Right, circumduction- Right, Right hip hike, knee flexed in stance- Right, antalgic, lateral hip instability, trunk flexed, and abducted- Right Distance walked: 25' with RW & 20' with cane Assistive device utilized: Single point cane, Walker - 2 wheeled, and BKA prosthesis Level of assistance: SBA with RW and Min A Gait velocity: 0.89 ft/sec Comments: excessive weight bearing on RW, partial weight on prosthesis   FUNCTIONAL TESTs:  Timed up and go (TUG): 39.25sec with cane & BKA prosthesis Berg Balance Scale: 24/56     CURRENT PROSTHETIC WEAR ASSESSMENT: Patient is dependent with: skin check, residual limb care, care of non-amputated limb, prosthetic cleaning, ply sock cleaning, correct ply sock adjustment, proper wear schedule/adjustment, and proper weight-bearing schedule/adjustment Donning prosthesis: Min A Doffing prosthesis: SBA Prosthetic wear tolerance: 3 hours, 1-2x/day, 5 of 5 days since delivery Prosthetic weight bearing tolerance: 5 minutes Edema: pitting. Residual limb condition: 48m scab wound on medial incision, dry skin, normal color & temperature, cylindrical shape Prosthetic description: silicon liner with pin lock suspension, total contact socket, hydraulic ankle / K3 foot K code/activity level with prosthetic use: Level 3       TODAY'S TREATMENT:  DATE:  05/06/2022: ***  05/05/2022: Prosthetic Training with TTA prosthesis: Pt arrived with increased antalgic pattern with cane.  He had flexible liner with no socks and his limb was >1" out of socket. PT demo & verbal cues on how to remove flexible socket. Pt verbalized understanding.  Pt redonned with 5-ply  sock without flexible liner. Pt amb with cane with continued antalgic pattern with high residual limb pain. PT recommended wearing without flexible liner 4 hrs on, 2hrs off during day. Limiting walking and using cane or RW when he does need to walk today to let his limb calm down. Pt verbalized understanding.   05/01/2022: Prosthetic Training with TTA prosthesis: PT recommended return to 4hrs on, 2 hrs off, 3x/day with changes prosthetist making today. Pt verbalized understanding.  PT advised at prosthetist appt to ambulate without device for alignment making sure to not abduct prosthesis.  Prosthetist will probable put flexible inner socket back inside socket which will change his ply sock fit. He will need to continue to adjust socks as he pushes fluid out with weight bearing. Pt & wife verbalized understanding.  Pt amb with single point cane 400' working on scanning, changing paces, negotiating obstacles weaving and 90* turns around mat table with verbal cues.   Pt amb 100' without device with supervision.  PT recommended walking program of short walks (room to room) without device increasing frequency during day, long walk (max tolerance) 1x/day with cane and medium distance initially without device then using cane when notices limbing or soreness. Pt & wife verbalized understanding.  Pt neg 6" curb with cane with PT cues on technique with supervision. Pt neg 12* ramp with cane  with PT cues on technique with supervision.  Neuromuscular Re-education: Standing lower extremity resistance with red Thera-Band kicks forward, abduction, adduction and extension initial 5 reps with chair back support and second 5 reps without support for all 4 directions. Pt verbalized understanding as HEP.    HOME EXERCISE PROGRAM:  Do each exercise 1-2  times per day Do each exercise 5-10 repetitions Hold each exercise for 2 seconds to feel your location  AT [place].  Try to find this position when standing still for activities.   USE TAPE ON FLOOR TO MARK THE MIDLINE POSITION which is even with middle of sink.  You also should try to feel with your limb pressure in socket.  You are trying to feel with limb what you used to feel with the bottom of your foot.  Side to Side Shift: Moving your hips only (not shoulders): move weight onto your left leg, HOLD/FEEL pressure in socket.  Move back to equal weight on each leg, HOLD/FEEL pressure in socket. Move weight onto your right leg, HOLD/FEEL pressure in socket. Move back to equal weight on each leg, HOLD/FEEL pressure in socket. Repeat.  Start with both hands on sink, progress to hand on prosthetic side only, then no hands.  Front to Back Shift: Moving your hips only (not shoulders): move your weight forward onto your toes, HOLD/FEEL pressure in socket. Move your weight back to equal Flat Foot on both legs, HOLD/FEEL  pressure in socket. Move your weight back onto your heels, HOLD/FEEL  pressure in socket. Move your weight back to equal on both legs, HOLD/FEEL  pressure in socket. Repeat.  Start with both hands on sink, progress to hand on prosthetic side only, then no hands.  Moving Cones /  Cups: With equal weight on each leg: Hold on with one hand the first time, then progress to no hand supports. Move cups from one side of sink to the other. Place cups ~2" out of your reach, progress to 10" beyond reach.  Place one hand in middle of sink and reach with other hand. Do both arms.  Then hover one hand and move cups with other hand.  Overhead/Upward Reaching: alternated reaching up to top cabinets or ceiling if no cabinets present. Keep equal weight on each leg. Start with one hand support on counter while other hand reaches and progress to no hand support with reaching.  ace one hand in middle of sink and reach with other hand. Do both arms.  Then hover one hand and move cups with other hand.  5.    Looking Over Shoulders: With equal weight on each leg: alternate turning to look over your shoulders with one hand support on counter as needed.  Start with head motions only to look in front of shoulder, then even with shoulder and progress to looking behind you. To look to side, move head /eyes, then shoulder on side looking pulls back, shift more weight to side looking and pull hip back. Place one hand in middle of sink and let go with other hand so your shoulder can pull back. Switch hands to look other way.   Then hover one hand and look over shoulder. If looking right, use left hand at sink. If looking left, use right hand at sink. 6.  Stepping with leg that is not amputated:  Move items under cabinet out of your way. Shift your hips/pelvis so weight on prosthesis. Tighten muscles in hip on prosthetic side.  SLOWLY step other leg so front of foot is in cabinet. Then step back to floor.      ASSESSMENT:   CLINICAL IMPRESSION: *** Pt's residual limb was too sore for standing or gait activities today.  He seems to understand how to remove flexible liner.    OBJECTIVE IMPAIRMENTS: Abnormal gait, decreased activity tolerance, decreased balance, decreased endurance, decreased knowledge of condition, decreased knowledge of use of DME, decreased mobility, decreased ROM, decreased strength, increased edema, impaired flexibility, postural dysfunction, prosthetic dependency , obesity, and wound on residual limb .    ACTIVITY LIMITATIONS: carrying, lifting, bending, standing, squatting, stairs, transfers, locomotion level, and prosthetic use   PARTICIPATION LIMITATIONS: meal prep, cleaning, laundry, driving, community activity, occupation, and yard work   PERSONAL FACTORS: Fitness, Time since onset of injury/illness/exacerbation, and 3+ comorbidities: see PMH  are also affecting patient's functional outcome.    REHAB POTENTIAL: Good   CLINICAL DECISION MAKING: Evolving/moderate complexity    EVALUATION COMPLEXITY: Moderate     GOALS: Goals reviewed with patient? Yes   SHORT TERM GOALS: Target date: 05/16/2022   Patient donnes prosthesis modified independent & verbalizes proper cleaning. Baseline: SEE OBJECTIVE DATA Goal status: INITIAL 2.  Patient tolerates prosthesis >12 hrs total /day without skin issues or limb pain after standing. Baseline: SEE OBJECTIVE DATA Goal status: INITIAL   3. Berg Balance >36/56 Baseline: SEE OBJECTIVE DATA Goal status: INITIAL   4. Patient ambulates 200' with cane & prosthesis with supervision. Baseline: SEE OBJECTIVE DATA Goal status: INITIAL   5. Patient negotiates ramps & curbs with cane & prosthesis with minA. Baseline: SEE OBJECTIVE DATA Goal status: INITIAL   LONG TERM GOALS: Target date: 07/31/2021   Patient demonstrates & verbalized understanding of prosthetic care to enable safe utilization of  prosthesis. Baseline: SEE OBJECTIVE DATA Goal status: INITIAL   Patient tolerates prosthesis wear >90% of awake hours without skin or limb pain issues. Baseline: SEE OBJECTIVE DATA Goal status: INITIAL   Functional Gait Assessment >22/30 to indicate lower fall risk Baseline: SEE OBJECTIVE DATA Goal status: INITIAL   Patient ambulates >500' with prosthesis only independently Baseline: SEE OBJECTIVE DATA Goal status: INITIAL   Patient negotiates ramps, curbs & stairs with single rail with prosthesis only independently. Baseline: SEE OBJECTIVE DATA Goal status: INITIAL   Patient demonstrates & verbalizes lifting, carrying, pushing, pulling with prosthesis only safely. Baseline: SEE OBJECTIVE DATA Goal status: INITIAL     PLAN:   PT FREQUENCY: 3x/week for 5 weeks, then 1-2 x/wk for 10 weeks   PT DURATION: other: 15 weeks   PLANNED INTERVENTIONS: Therapeutic exercises, Therapeutic activity, Neuromuscular re-education, Balance training, Gait training, Patient/Family education, Self Care, Stair training, Vestibular  training, Prosthetic training, DME instructions, and physical performance testing   PLAN FOR NEXT SESSION: *** check on limb pain, continue to work on work on ramps & curbs with cane, stairs with 2 rails using RLE,  continue prosthetic gait with cane, balance activities   Jamey Reas, PT, DPT 05/05/2022, 9:19 AM

## 2022-05-07 ENCOUNTER — Encounter: Payer: Self-pay | Admitting: Physical Therapy

## 2022-05-07 ENCOUNTER — Ambulatory Visit (INDEPENDENT_AMBULATORY_CARE_PROVIDER_SITE_OTHER): Payer: 59 | Admitting: Physical Therapy

## 2022-05-07 DIAGNOSIS — R2689 Other abnormalities of gait and mobility: Secondary | ICD-10-CM | POA: Diagnosis not present

## 2022-05-07 DIAGNOSIS — M6281 Muscle weakness (generalized): Secondary | ICD-10-CM

## 2022-05-07 DIAGNOSIS — S81801A Unspecified open wound, right lower leg, initial encounter: Secondary | ICD-10-CM

## 2022-05-07 DIAGNOSIS — R2681 Unsteadiness on feet: Secondary | ICD-10-CM | POA: Diagnosis not present

## 2022-05-07 NOTE — Therapy (Signed)
OUTPATIENT PHYSICAL THERAPY TREATMENT NOTE   Patient Name: Raymond [name] MRN: [id number] DOB:1960/05/29, 61 y.o., male Today's Date: 05/07/2022  PCP: [name], FNP  REFERRING PROVIDER: Newt Minion, MD   END OF SESSION:   PT End of Session - 05/07/22 0855     Visit Number 9    Number of Visits 25    Date for PT Re-Evaluation 08/01/22    Authorization Type [employer]ET[employer]    Authorization Time Period NO COPAY, 35 PT/OT VISITS Calendar Year Benefit Date 05/26/2021 - 05/25/2022    Authorization - Visit Number 9    Authorization - Number of Visits 35    Progress Note Due on Visit 10    PT Start Time 0853    PT Stop Time 0931    PT Time Calculation (min) 38 min    Equipment Utilized During Treatment Gait belt    Activity Tolerance Patient tolerated treatment well    Behavior During Therapy WFL for tasks assessed/performed                  Past Medical History:  Diagnosis Date   Diabetes ([place])    Diabetes mellitus without complication ([place])    type 2   ESRD (end stage renal disease) ([place])    03/09/2015- patient had a kidney transplant   Hypertension    Peripheral vascular disease ([place])    Renal disorder    Renal insufficiency    Past Surgical History:  Procedure Laterality Date   ABDOMINAL AORTOGRAM W/LOWER EXTREMITY N/A 09/11/2021   Procedure: ABDOMINAL AORTOGRAM W/LOWER EXTREMITY;  Surgeon: Wellington Hampshire, MD;  Location: [facility];  Service: Cardiovascular;  Laterality: N/A;   AMPUTATION Right 09/25/2021   Procedure: RIGHT TRANSMETATARSAL AMPUTATION AND APPLY TISSUE GRAFT;  Surgeon: Newt Minion, MD;  Location: [facility];  Service: Orthopedics;  Laterality: Right;   AMPUTATION Right 11/16/2021   Procedure: RIGHT AMPUTATION BELOW KNEE WITH WOUND VAC APPLICATION;  Surgeon: Newt Minion, MD;  Location: [facility];  Service: Orthopedics;  Laterality: Right;   BONE BIOPSY Right 03/06/2021   Procedure: BONE BIOPSY;  Surgeon: Trula Slade, DPM;   Location: [facility];  Service: Podiatry;  Laterality: Right;   CENTRAL VENOUS CATHETER INSERTION Right 01/20/2020   Procedure: INSERTION CENTRAL LINE ADULT; Tunneled central line;  Surgeon: Virl Cagey, MD;  Location: [facility];  Service: General;  Laterality: Right;   COLONOSCOPY N/A 12/05/2014   DJM:EQASTMHD external and internal hemorrhoid/mild diverticulosis/11 polyps removed   ESOPHAGOGASTRODUODENOSCOPY N/A 12/05/2014   QQI:[facility] duodenitis   GRAFT APPLICATION Right 89/21/1941   Procedure: GRAFT APPLICATION;  Surgeon: Trula Slade, DPM;  Location: [facility];  Service: Podiatry;  Laterality: Right;   INCISION AND DRAINAGE OF WOUND Right 08/15/2021   Procedure: IRRIGATION AND DEBRIDEMENT WOUND;  Surgeon: Landis Martins, DPM;  Location: [facility];  Service: Podiatry;  Laterality: Right;  need to make card not  a irrigation and debridement card   IR FLUORO GUIDE CV LINE RIGHT  03/01/2019   IR PERC TUN PERIT CATH WO PORT S&I /IMAG  06/27/2021   IR REMOVAL TUN CV CATH W/O FL  05/10/2019   IR REMOVAL TUN CV CATH W/O FL  02/29/2020   IR REMOVAL TUN CV CATH W/O FL  07/19/2021   IR US GUIDE VASC ACCESS RIGHT  03/01/2019   IR US GUIDE VASC ACCESS RIGHT  06/27/2021   IRRIGATION AND DEBRIDEMENT ELBOW Left 06/24/2021   Procedure: IRRIGATION AND DEBRIDEMENT ELBOW;  Surgeon: Lovell Sheehan, MD;  Location: [facility];  Service: Orthopedics;  Laterality: Left;   KIDNEY TRANSPLANT  03/27/2015   Penile Pump Insertion     PERIPHERAL VASCULAR ATHERECTOMY  09/11/2021   Procedure: PERIPHERAL VASCULAR ATHERECTOMY;  Surgeon: Wellington Hampshire, MD;  Location: [facility];  Service: Cardiovascular;;  Shockwave Lithotripsy   TEE WITHOUT CARDIOVERSION N/A 01/17/2020   Procedure: TRANSESOPHAGEAL ECHOCARDIOGRAM (TEE) WITH PROPOFOL;  Surgeon: Arnoldo Lenis, MD;  [facility];  Service: Endoscopy;  Laterality: N/A;   WOUND DEBRIDEMENT Right 03/06/2021   Procedure: DEBRIDEMENT WOUND;  Surgeon: Trula Slade, DPM;  Location: [facility];  Service: Podiatry;  Laterality: Right;   Patient Active Problem List   Diagnosis Date Noted   Cutaneous abscess of right foot    Hyperkalemia 08/28/2021   Obesity (BMI 30-39.9) 08/28/2021   History of anemia due to chronic kidney disease 08/28/2021   Diabetic foot ulcer ([place]) 08/14/2021   Hypomagnesemia 06/25/2021   Septic arthritis of elbow, left ([place]) 06/24/2021   Peripheral vascular disease ([place]) 04/24/2020   Pyogenic inflammation of bone ([facility])    Thrombophlebitis of superficial veins of right lower extremity    Difficult intravenous access    MRSA bacteremia    Diabetic foot infection ([place])    Infected wound 01/12/2020   Critical lower limb ischemia ([place]) 02/02/2019   Personal history of noncompliance with medical treatment, presenting hazards to health 10/07/2018   Critical limb ischemia with history of revascularization of same extremity ([place]) 07/27/2018   Mixed hyperlipidemia 02/19/2018   History of renal transplant 02/19/2018   Conductive hearing loss, bilateral 08/31/2017   Nuclear sclerotic cataract of right eye 10/07/2016   Pseudophakia of left eye 10/07/2016   Hypertension due to endocrine disorder 03/04/2016   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 ([place]) 03/04/2016   Renal failure 03/04/2016   Proliferative diabetic retinopathy with macular edema associated with type 2 diabetes mellitus ([place]) 03/04/2016   Immunosuppression ([place]) 12/10/2015   Nausea vomiting and diarrhea 12/10/2015   Type 2 diabetes mellitus ([place]) 12/10/2015   Essential hypertension, benign 12/10/2015   Pancytopenia ([place]) 12/10/2015   Encounter for screening colonoscopy 11/15/2014   GERD (gastroesophageal reflux disease) 11/15/2014    REFERRING DIAG: E28.003K (ICD-10-CM) - Below-knee amputation of right lower extremit   ONSET DATE: 04/10/2022 prosthesis delivery   THERAPY DIAG:  Unsteadiness  on feet  Other abnormalities of gait and mobility  Muscle weakness (generalized)  Wound of right lower extremity, initial encounter  Rationale for Evaluation and Treatment Rehabilitation  PERTINENT HISTORY: DM2, PVD, ESRD s/p renal transplant 03/09/2015 on immunosuppressive meds, HTN   PRECAUTIONS: None  SUBJECTIVE:  SUBJECTIVE STATEMENT:  He has been able to wear 1.5 hrs off 3hrs 2x yesterday. Limb is tender / sore.  He is using socks and not using flexible inner liner/socket.   PAIN:  Are you having pain? Residual limb pain 3/10 soreness from wearing prosthesis without ability to seat limb into socket.   OBJECTIVE: (objective measures completed at initial evaluation unless otherwise dated) POSTURE: rounded shoulders, forward head, flexed trunk , and weight shift left   LOWER EXTREMITY ROM:   ROM P:passive  A:active Right eval Left eval  Hip flexion      Hip extension      Hip abduction      Hip adduction      Hip internal rotation      Hip external rotation      Knee flexion 100*    Knee extension -5*    Ankle dorsiflexion      Ankle plantarflexion      Ankle inversion      Ankle eversion       (Blank rows = not tested)   LOWER EXTREMITY MMT:   MMT Right eval Left eval  Hip flexion 3+/5    Hip extension 3-/5    Hip abduction 3-/5    Hip adduction      Hip internal rotation      Hip external rotation      Knee flexion 3-/5    Knee extension 3/5    Ankle dorsiflexion      Ankle plantarflexion      Ankle inversion      Ankle eversion      (Blank rows = not tested)   TRANSFERS: Sit to stand: 18" chair with armrests to RW with supervision.  Stand to sit: RW to 18" chair with armrests with supervision   PROSTHETIC GAIT WITH BKA PROSTHESIS: Gait pattern: step to pattern,  decreased arm swing- Right, decreased step length- Right, decreased stance time- Left, decreased stride length, decreased hip/knee flexion- Right, circumduction- Right, Right hip hike, knee flexed in stance- Right, antalgic, lateral hip instability, trunk flexed, and abducted- Right Distance walked: 61' with RW & 20' with cane Assistive device utilized: Single point cane, Walker - 2 wheeled, and BKA prosthesis Level of assistance: SBA with RW and Min A Gait velocity: 0.89 ft/sec Comments: excessive weight bearing on RW, partial weight on prosthesis   FUNCTIONAL TESTs:  Timed up and go (TUG): 39.25sec with cane & BKA prosthesis Berg Balance Scale: 24/56     CURRENT PROSTHETIC WEAR ASSESSMENT: Patient is dependent with: skin check, residual limb care, care of non-amputated limb, prosthetic cleaning, ply sock cleaning, correct ply sock adjustment, proper wear schedule/adjustment, and proper weight-bearing schedule/adjustment Donning prosthesis: Min A Doffing prosthesis: SBA Prosthetic wear tolerance: 3 hours, 1-2x/day, 5 of 5 days since delivery Prosthetic weight bearing tolerance: 5 minutes Edema: pitting. Residual limb condition: 39m scab wound on medial incision, dry skin, normal color & temperature, cylindrical shape Prosthetic description: silicon liner with pin lock suspension, total contact socket, hydraulic ankle / K3 foot K code/activity level with prosthetic use: Level 3       TODAY'S TREATMENT:  DATE:  05/07/2022: Prosthetic Training with TTA prosthesis: Pt ambulated without assistive device with RLE (prosthetic) knee flexion in stance with decreased stance time.  PT demo & verbal cues on sit/stand without UE assist and initiating gait with decreased hesitancy. Pt performed 5 reps.  Stance RLE with knee ext & upright trunk tapping LLE into lower cabinet 15  reps.  PT recommended building wear back up to 4hrs on, 2hrs off 3x/day using limb pain as guide. Use socks not flexible inner liner for now.  May try again once wearing >10-ply socks and no limb pain. Pt verbalized understanding.   Therapeutic Exercise: PT instructed in HEP Access Code: [email or web address] with demo, verbal & HO cues.  Pt performed HEP as noted. TKE with black theraband & kicks with red theraband. Pt verbalizes understanding including rational for each exercise.   05/05/2022: Prosthetic Training with TTA prosthesis: Pt arrived with increased antalgic pattern with cane.  He had flexible liner with no socks and his limb was >1" out of socket. PT demo & verbal cues on how to remove flexible socket. Pt verbalized understanding.  Pt redonned with 5-ply sock without flexible liner. Pt amb with cane with continued antalgic pattern with high residual limb pain. PT recommended wearing without flexible liner 4 hrs on, 2hrs off during day. Limiting walking and using cane or RW when he does need to walk today to let his limb calm down. Pt verbalized understanding.   05/01/2022: Prosthetic Training with TTA prosthesis: PT recommended return to 4hrs on, 2 hrs off, 3x/day with changes prosthetist making today. Pt verbalized understanding.  PT advised at prosthetist appt to ambulate without device for alignment making sure to not abduct prosthesis.  Prosthetist will probable put flexible inner socket back inside socket which will change his ply sock fit. He will need to continue to adjust socks as he pushes fluid out with weight bearing. Pt & wife verbalized understanding.  Pt amb with single point cane 400' working on scanning, changing paces, negotiating obstacles weaving and 90* turns around mat table with verbal cues.   Pt amb 100' without device with supervision.  PT recommended walking program of short walks (room to room) without device increasing frequency during day, long walk (max tolerance)  1x/day with cane and medium distance initially without device then using cane when notices limbing or soreness. Pt & wife verbalized understanding.  Pt neg 6" curb with cane with PT cues on technique with supervision. Pt neg 12* ramp with cane  with PT cues on technique with supervision.  Neuromuscular Re-education: Standing lower extremity resistance with red Thera-Band kicks forward, abduction, adduction and extension initial 5 reps with chair back support and second 5 reps without support for all 4 directions. Pt verbalized understanding as HEP.    HOME EXERCISE PROGRAM: Access Code: [facility] URL: [email or web address] Date: 05/07/2022 Prepared by: Jamey Reas  Exercises - Hip Flexor Stretch at [facility] of Bed  - 1-3 x daily - 7 x weekly - 1 sets - 3 reps - 20-30 seconds hold - Supine Quadriceps Stretch with Strap on Table  - 1-3 x daily - 7 x weekly - 1 sets - 3 reps - 20-30 seconds hold - Supine Bridge  - 1 x daily - 7 x weekly - 2-3 sets - 10 reps - 5 seconds hold - Marching Bridge  - 1 x daily - 7 x weekly - 2-3 sets - 10 reps - 5 seconds hold - Standing Terminal Knee Extension with Resistance  -  1 x daily - 7 x weekly - 2-3 sets - 10 reps - 5 seconds hold - Standing Hip Flexion with Resistance (Mirrored)  - 1 x daily - 7 x weekly - 1 sets - 10 reps - Standing Hip Adduction with Resistance (Mirrored)  - 1 x daily - 7 x weekly - 1 sets - 10 reps - Standing Hip Extension with Resistance  - 1 x daily - 7 x weekly - 1 sets - 10 reps - Standing Hip Abduction with Theraband Resistance  - 1 x daily - 7 x weekly - 1 sets - 10 reps   Do each exercise 1-2  times per day Do each exercise 5-10 repetitions Hold each exercise for 2 seconds to feel your location  AT [place].  Try to find this position when standing still for activities.   USE TAPE ON FLOOR TO MARK THE MIDLINE POSITION which is even with middle  of sink.  You also should try to feel with your limb pressure in socket.  You are trying to feel with limb what you used to feel with the bottom of your foot.  Side to Side Shift: Moving your hips only (not shoulders): move weight onto your left leg, HOLD/FEEL pressure in socket.  Move back to equal weight on each leg, HOLD/FEEL pressure in socket. Move weight onto your right leg, HOLD/FEEL pressure in socket. Move back to equal weight on each leg, HOLD/FEEL pressure in socket. Repeat.  Start with both hands on sink, progress to hand on prosthetic side only, then no hands.  Front to Back Shift: Moving your hips only (not shoulders): move your weight forward onto your toes, HOLD/FEEL pressure in socket. Move your weight back to equal Flat Foot on both legs, HOLD/FEEL  pressure in socket. Move your weight back onto your heels, HOLD/FEEL  pressure in socket. Move your weight back to equal on both legs, HOLD/FEEL  pressure in socket. Repeat.  Start with both hands on sink, progress to hand on prosthetic side only, then no hands.  Moving Cones / Cups: With equal weight on each leg: Hold on with one hand the first time, then progress to no hand supports. Move cups from one side of sink to the other. Place cups ~2" out of your reach, progress to 10" beyond reach.  Place one hand in middle of sink and reach with other hand. Do both arms.  Then hover one hand and move cups with other hand.  Overhead/Upward Reaching: alternated reaching up to top cabinets or ceiling if no cabinets present. Keep equal weight on each leg. Start with one hand support on counter while other hand reaches and progress to no hand support with reaching.  ace one hand in middle of sink and reach with other hand. Do both arms.  Then hover one hand and move cups with other hand.  5.   Looking Over Shoulders: With equal weight on each leg: alternate turning to look over your shoulders with one hand support on counter as needed.  Start with head  motions only to look in front of shoulder, then even with shoulder and progress to looking behind you. To look to side, move head /eyes, then shoulder on side looking pulls back, shift more weight to side looking and pull hip back. Place one hand in middle of sink and let go with other hand so your shoulder can pull back. Switch hands to  look other way.   Then hover one hand and look over shoulder. If looking right, use left hand at sink. If looking left, use right hand at sink. 6.  Stepping with leg that is not amputated:  Move items under cabinet out of your way. Shift your hips/pelvis so weight on prosthesis. Tighten muscles in hip on prosthetic side.  SLOWLY step other leg so front of foot is in cabinet. Then step back to floor.      ASSESSMENT:   CLINICAL IMPRESSION: Patient seems to understand HEP to work on improving RLE stance position, strength & balance. Pt's limb seems less sore today but is still limiting his wear, standing & gait.    OBJECTIVE IMPAIRMENTS: Abnormal gait, decreased activity tolerance, decreased balance, decreased endurance, decreased knowledge of condition, decreased knowledge of use of DME, decreased mobility, decreased ROM, decreased strength, increased edema, impaired flexibility, postural dysfunction, prosthetic dependency , obesity, and wound on residual limb .    ACTIVITY LIMITATIONS: carrying, lifting, bending, standing, squatting, stairs, transfers, locomotion level, and prosthetic use   PARTICIPATION LIMITATIONS: meal prep, cleaning, laundry, driving, community activity, occupation, and yard work   PERSONAL FACTORS: Fitness, Time since onset of injury/illness/exacerbation, and 3+ comorbidities: see PMH  are also affecting patient's functional outcome.    REHAB POTENTIAL: Good   CLINICAL DECISION MAKING: Evolving/moderate complexity   EVALUATION COMPLEXITY: Moderate     GOALS: Goals reviewed with patient? Yes   SHORT TERM GOALS: Target date:  05/16/2022   Patient donnes prosthesis modified independent & verbalizes proper cleaning. Baseline: SEE OBJECTIVE DATA Goal status: INITIAL 2.  Patient tolerates prosthesis >12 hrs total /day without skin issues or limb pain after standing. Baseline: SEE OBJECTIVE DATA Goal status: INITIAL   3. Berg Balance >36/56 Baseline: SEE OBJECTIVE DATA Goal status: INITIAL   4. Patient ambulates 200' with cane & prosthesis with supervision. Baseline: SEE OBJECTIVE DATA Goal status: INITIAL   5. Patient negotiates ramps & curbs with cane & prosthesis with minA. Baseline: SEE OBJECTIVE DATA Goal status: INITIAL   LONG TERM GOALS: Target date: 07/31/2021   Patient demonstrates & verbalized understanding of prosthetic care to enable safe utilization of prosthesis. Baseline: SEE OBJECTIVE DATA Goal status: INITIAL   Patient tolerates prosthesis wear >90% of awake hours without skin or limb pain issues. Baseline: SEE OBJECTIVE DATA Goal status: INITIAL   Functional Gait Assessment >22/30 to indicate lower fall risk Baseline: SEE OBJECTIVE DATA Goal status: INITIAL   Patient ambulates >500' with prosthesis only independently Baseline: SEE OBJECTIVE DATA Goal status: INITIAL   Patient negotiates ramps, curbs & stairs with single rail with prosthesis only independently. Baseline: SEE OBJECTIVE DATA Goal status: INITIAL   Patient demonstrates & verbalizes lifting, carrying, pushing, pulling with prosthesis only safely. Baseline: SEE OBJECTIVE DATA Goal status: INITIAL     PLAN:   PT FREQUENCY: 3x/week for 5 weeks, then 1-2 x/wk for 10 weeks   PT DURATION: other: 15 weeks   PLANNED INTERVENTIONS: Therapeutic exercises, Therapeutic activity, Neuromuscular re-education, Balance training, Gait training, Patient/Family education, Self Care, Stair training, Vestibular training, Prosthetic training, DME instructions, and physical performance testing   PLAN FOR NEXT SESSION: Do 10th visit  progress note, continue to work on work on ramps & curbs with cane, stairs with 2 rails using RLE,  continue prosthetic gait with cane, balance activities   Jamey Reas, PT, DPT 05/07/2022, 12:44 PM

## 2022-05-09 ENCOUNTER — Other Ambulatory Visit: Payer: 59

## 2022-05-11 ENCOUNTER — Other Ambulatory Visit: Payer: Self-pay | Admitting: Endocrinology

## 2022-05-11 ENCOUNTER — Other Ambulatory Visit: Payer: Self-pay | Admitting: Cardiovascular Disease

## 2022-05-11 ENCOUNTER — Other Ambulatory Visit: Payer: Self-pay | Admitting: Nurse Practitioner

## 2022-05-11 DIAGNOSIS — E1165 Type 2 diabetes mellitus with hyperglycemia: Secondary | ICD-10-CM

## 2022-05-12 ENCOUNTER — Encounter: Payer: Self-pay | Admitting: Physical Therapy

## 2022-05-12 ENCOUNTER — Ambulatory Visit (INDEPENDENT_AMBULATORY_CARE_PROVIDER_SITE_OTHER): Payer: 59 | Admitting: Physical Therapy

## 2022-05-12 DIAGNOSIS — R2689 Other abnormalities of gait and mobility: Secondary | ICD-10-CM | POA: Diagnosis not present

## 2022-05-12 DIAGNOSIS — S81801A Unspecified open wound, right lower leg, initial encounter: Secondary | ICD-10-CM | POA: Diagnosis not present

## 2022-05-12 DIAGNOSIS — R2681 Unsteadiness on feet: Secondary | ICD-10-CM

## 2022-05-12 DIAGNOSIS — M6281 Muscle weakness (generalized): Secondary | ICD-10-CM | POA: Diagnosis not present

## 2022-05-12 NOTE — Therapy (Signed)
OUTPATIENT PHYSICAL THERAPY TREATMENT NOTE & PROGRESS NOTE   Patient Name: Raymond [name] MRN: [id number] DOB:1960-07-22, 61 y.o., male Today's Date: 05/12/2022  PCP: [name], FNP  REFERRING PROVIDER: Newt Minion, MD   Progress Note Reporting Period 04/15/2022 to 05/12/2022  See note below for Objective Data and Assessment of Progress/Goals.      END OF SESSION:   PT End of Session - 05/12/22 0847     Visit Number 10    Number of Visits 25    Date for PT Re-Evaluation 08/01/22    Authorization Type [employer]ET[employer]    Authorization Time Period NO COPAY, 35 PT/OT VISITS Calendar Year Benefit Date 05/26/2021 - 05/25/2022    Authorization - Number of Visits 35    Progress Note Due on Visit 54    PT Start Time 0846    PT Stop Time 0925    PT Time Calculation (min) 39 min    Equipment Utilized During Treatment Gait belt    Activity Tolerance Patient tolerated treatment well    Behavior During Therapy WFL for tasks assessed/performed                  Past Medical History:  Diagnosis Date   Diabetes ([place])    Diabetes mellitus without complication ([place])    type 2   ESRD (end stage renal disease) ([place])    03/09/2015- patient had a kidney transplant   Hypertension    Peripheral vascular disease ([place])    Renal disorder    Renal insufficiency    Past Surgical History:  Procedure Laterality Date   ABDOMINAL AORTOGRAM W/LOWER EXTREMITY N/A 09/11/2021   Procedure: ABDOMINAL AORTOGRAM W/LOWER EXTREMITY;  Surgeon: Wellington Hampshire, MD;  Location: [facility];  Service: Cardiovascular;  Laterality: N/A;   AMPUTATION Right 09/25/2021   Procedure: RIGHT TRANSMETATARSAL AMPUTATION AND APPLY TISSUE GRAFT;  Surgeon: Newt Minion, MD;  Location: [facility];  Service: Orthopedics;  Laterality: Right;   AMPUTATION Right 11/16/2021   Procedure: RIGHT AMPUTATION BELOW KNEE WITH WOUND VAC APPLICATION;  Surgeon: Newt Minion, MD;  Location: [facility];  Service: Orthopedics;   Laterality: Right;   BONE BIOPSY Right 03/06/2021   Procedure: BONE BIOPSY;  Surgeon: Trula Slade, DPM;  Location: [facility];  Service: Podiatry;  Laterality: Right;   CENTRAL VENOUS CATHETER INSERTION Right 01/20/2020   Procedure: INSERTION CENTRAL LINE ADULT; Tunneled central line;  Surgeon: Virl Cagey, MD;  Location: [facility];  Service: General;  Laterality: Right;   COLONOSCOPY N/A 12/05/2014   BTD:VVOHYWVP external and internal hemorrhoid/mild diverticulosis/11 polyps removed   ESOPHAGOGASTRODUODENOSCOPY N/A 12/05/2014   XTG:GYIR duodenitis   GRAFT APPLICATION Right 48/54/6270   Procedure: GRAFT APPLICATION;  Surgeon: Trula Slade, DPM;  Location: [facility];  Service: Podiatry;  Laterality: Right;   INCISION AND DRAINAGE OF WOUND Right 08/15/2021   Procedure: IRRIGATION AND DEBRIDEMENT WOUND;  Surgeon: Landis Martins, DPM;  Location: [facility];  Service: Podiatry;  Laterality: Right;  need to make card not  a irrigation and debridement card   IR FLUORO GUIDE CV LINE RIGHT  03/01/2019   IR PERC TUN PERIT CATH WO PORT S&I /IMAG  06/27/2021   IR REMOVAL TUN CV CATH W/O FL  05/10/2019   IR REMOVAL TUN CV CATH W/O FL  02/29/2020   IR REMOVAL TUN CV CATH W/O FL  07/19/2021   IR US GUIDE VASC ACCESS RIGHT  03/01/2019   IR US GUIDE VASC ACCESS  RIGHT  06/27/2021   IRRIGATION AND DEBRIDEMENT ELBOW Left 06/24/2021   Procedure: IRRIGATION AND DEBRIDEMENT ELBOW;  Surgeon: Lovell Sheehan, MD;  Location: [facility];  Service: Orthopedics;  Laterality: Left;   KIDNEY TRANSPLANT  03/27/2015   Penile Pump Insertion     PERIPHERAL VASCULAR ATHERECTOMY  09/11/2021   Procedure: PERIPHERAL VASCULAR ATHERECTOMY;  Surgeon: Wellington Hampshire, MD;  Location: [facility];  Service: Cardiovascular;;  Shockwave Lithotripsy   TEE WITHOUT CARDIOVERSION N/A 01/17/2020   Procedure: TRANSESOPHAGEAL ECHOCARDIOGRAM (TEE) WITH PROPOFOL;  Surgeon: Arnoldo Lenis, MD;  [facility];  Service:  Endoscopy;  Laterality: N/A;   WOUND DEBRIDEMENT Right 03/06/2021   Procedure: DEBRIDEMENT WOUND;  Surgeon: Trula Slade, DPM;  Location[facility];  Service: Podiatry;  Laterality: Right;   Patient Active Problem List   Diagnosis Date Noted   Cutaneous abscess of right foot    Hyperkalemia 08/28/2021   Obesity (BMI 30-39.9) 08/28/2021   History of anemia due to chronic kidney disease 08/28/2021   Diabetic foot ulcer ([place]) 08/14/2021   Hypomagnesemia 06/25/2021   Septic arthritis of elbow, left ([place]) 06/24/2021   Peripheral vascular disease ([place]) 04/24/2020   Pyogenic inflammation of bone ([facility])    Thrombophlebitis of superficial veins of right lower extremity    Difficult intravenous access    MRSA bacteremia    Diabetic foot infection ([place])    Infected wound 01/12/2020   Critical lower limb ischemia ([place]) 02/02/2019   Personal history of noncompliance with medical treatment, presenting hazards to health 10/07/2018   Critical limb ischemia with history of revascularization of same extremity ([place]) 07/27/2018   Mixed hyperlipidemia 02/19/2018   History of renal transplant 02/19/2018   Conductive hearing loss, bilateral 08/31/2017   Nuclear sclerotic cataract of right eye 10/07/2016   Pseudophakia of left eye 10/07/2016   Hypertension due to endocrine disorder 03/04/2016   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 ([place]) 03/04/2016   Renal failure 03/04/2016   Proliferative diabetic retinopathy with macular edema associated with type 2 diabetes mellitus ([place]) 03/04/2016   Immunosuppression ([place]) 12/10/2015   Nausea vomiting and diarrhea 12/10/2015   Type 2 diabetes mellitus ([place]) 12/10/2015   Essential hypertension, benign 12/10/2015   Pancytopenia ([place]) 12/10/2015   Encounter for screening colonoscopy 11/15/2014   GERD (gastroesophageal reflux disease) 11/15/2014    REFERRING DIAG: D35.701X (ICD-10-CM) -  Below-knee amputation of right lower extremit   ONSET DATE: 04/10/2022 prosthesis delivery   THERAPY DIAG:  Unsteadiness on feet  Other abnormalities of gait and mobility  Muscle weakness (generalized)  Wound of right lower extremity, initial encounter  Rationale for Evaluation and Treatment Rehabilitation  PERTINENT HISTORY: DM2, PVD, ESRD s/p renal transplant 03/09/2015 on immunosuppressive meds, HTN   PRECAUTIONS: None  SUBJECTIVE:  SUBJECTIVE STATEMENT: He has been doing his exercises. He wore prosthesis 8 hrs total without flexible inner liner.   PAIN:  Are you having pain? Residual limb pain 3/10 soreness from wearing prosthesis without ability to seat limb into socket.   OBJECTIVE: (objective measures completed at initial evaluation unless otherwise dated) POSTURE: rounded shoulders, forward head, flexed trunk , and weight shift left   LOWER EXTREMITY ROM:   ROM P:passive  A:active Right eval Left eval  Hip flexion      Hip extension      Hip abduction      Hip adduction      Hip internal rotation      Hip external rotation      Knee flexion 100*    Knee extension -5*    Ankle dorsiflexion      Ankle plantarflexion      Ankle inversion      Ankle eversion       (Blank rows = not tested)   LOWER EXTREMITY MMT:   MMT Right eval Left eval  Hip flexion 3+/5    Hip extension 3-/5    Hip abduction 3-/5    Hip adduction      Hip internal rotation      Hip external rotation      Knee flexion 3-/5    Knee extension 3/5    Ankle dorsiflexion      Ankle plantarflexion      Ankle inversion      Ankle eversion      (Blank rows = not tested)   TRANSFERS: Sit to stand: 18" chair with armrests to RW with supervision.  Stand to sit: RW to 18" chair with armrests with supervision    PROSTHETIC GAIT WITH BKA PROSTHESIS: Gait pattern: step to pattern, decreased arm swing- Right, decreased step length- Right, decreased stance time- Left, decreased stride length, decreased hip/knee flexion- Right, circumduction- Right, Right hip hike, knee flexed in stance- Right, antalgic, lateral hip instability, trunk flexed, and abducted- Right Distance walked: 74' with RW & 20' with cane Assistive device utilized: Single point cane, Walker - 2 wheeled, and BKA prosthesis Level of assistance: SBA with RW and Min A Gait velocity: 0.89 ft/sec Comments: excessive weight bearing on RW, partial weight on prosthesis   FUNCTIONAL TESTs:  05/12/2022:  Merrilee Jansky Balance test 42/56 TUG with prosthesis only: std 9.96sec & cog 10.94sec   [facility] PT Assessment - 05/12/22 0850       Standardized Balance Assessment   Standardized Balance Assessment Berg Balance Test;Timed Up and Go Test      Berg Balance Test   Sit to Stand Able to stand without using hands and stabilize independently    Standing Unsupported Able to stand safely 2 minutes    Sitting with Back Unsupported but Feet Supported on Floor or Stool Able to sit safely and securely 2 minutes    Stand to Sit Sits safely with minimal use of hands    Transfers Able to transfer safely, minor use of hands    Standing Unsupported with Eyes Closed Able to stand 10 seconds with supervision    Standing Unsupported with Feet Together Able to place feet together independently and stand 1 minute safely    From Standing, Reach Forward with Outstretched Arm Can reach forward >12 cm safely (5")    From Standing Position, Pick up Object from Floor Able to pick up shoe, needs supervision    From Standing Position, Turn to Look Behind  Over each Shoulder Turn sideways only but maintains balance    Turn 360 Degrees Able to turn 360 degrees safely but slowly    Standing Unsupported, Alternately Place Feet on Step/Stool Able to complete 4 steps without aid or  supervision    Standing Unsupported, One Foot in Front Able to take small step independently and hold 30 seconds    Standing on One Leg Tries to lift leg/unable to hold 3 seconds but remains standing independently    Total Score 42      Timed Up and Go Test   Normal TUG (seconds) 9.96   TTA prosthesis only   Cognitive TUG (seconds) 10.94   prosthesis only instructing how to change brake pads, no LOB             04/15/2022: Timed up and go (TUG): 39.25sec with cane & BKA prosthesis Berg Balance Scale: 24/56     CURRENT PROSTHETIC WEAR ASSESSMENT: Patient is dependent with: skin check, residual limb care, care of non-amputated limb, prosthetic cleaning, ply sock cleaning, correct ply sock adjustment, proper wear schedule/adjustment, and proper weight-bearing schedule/adjustment Donning prosthesis: Min A Doffing prosthesis: SBA Prosthetic wear tolerance: 3 hours, 1-2x/day, 5 of 5 days since delivery Prosthetic weight bearing tolerance: 5 minutes Edema: pitting. Residual limb condition: 76m scab wound on medial incision, dry skin, normal color & temperature, cylindrical shape Prosthetic description: silicon liner with pin lock suspension, total contact socket, hydraulic ankle / K3 foot K code/activity level with prosthetic use: Level 3       TODAY'S TREATMENT:                                                                                                                             DATE:  05/12/2022: Prosthetic Training with TTA prosthesis: PT recommending wear 4hrs 3x/day with off time 2 hours as limb is still tender. Pt verbalized understanding.  PT recommended using cane for gait beyond short distances around house as only minimal antalgic pattern. Pt verbalized understanding. Pt amb 200' with cane including neg ramp & curb with general cues for deviations no LOB.  Therapeutic Activities: PT demo & verbal cues on floor transfer via half kneeling pushing on chair bottom. Pt  performed with ea LE forward 1 rep. Lifting 20# kettle bell 5 reps with PT cues on technique.   Neuromuscular Re-ed for balance: Alternate tapping feet into lower cabinet 1 min. Working on SSunGardtennis ball forward/backward, medial/lateral & circles CW / CCW 10 reps ea LE ea motion light LUE support.  05/07/2022: Prosthetic Training with TTA prosthesis: Pt ambulated without assistive device with RLE (prosthetic) knee flexion in stance with decreased stance time.  PT demo & verbal cues on sit/stand without UE assist and initiating gait with decreased hesitancy. Pt performed 5 reps.  Stance RLE with knee ext & upright trunk tapping LLE into lower cabinet 15 reps.  PT recommended building wear back up to 4hrs on, 2hrs off  3x/day using limb pain as guide. Use socks not flexible inner liner for now.  May try again once wearing >10-ply socks and no limb pain. Pt verbalized understanding.   Therapeutic Exercise: PT instructed in HEP Access Code: [email or web address] with demo, verbal & HO cues.  Pt performed HEP as noted. TKE with black theraband & kicks with red theraband. Pt verbalizes understanding including rational for each exercise.   05/05/2022: Prosthetic Training with TTA prosthesis: Pt arrived with increased antalgic pattern with cane.  He had flexible liner with no socks and his limb was >1" out of socket. PT demo & verbal cues on how to remove flexible socket. Pt verbalized understanding.  Pt redonned with 5-ply sock without flexible liner. Pt amb with cane with continued antalgic pattern with high residual limb pain. PT recommended wearing without flexible liner 4 hrs on, 2hrs off during day. Limiting walking and using cane or RW when he does need to walk today to let his limb calm down. Pt verbalized understanding.    HOME EXERCISE PROGRAM: Access Code: [facility] URL: [email or web address] Date: 05/07/2022 Prepared by: Jamey Reas  Exercises - Hip Flexor Stretch at  [facility] of Bed  - 1-3 x daily - 7 x weekly - 1 sets - 3 reps - 20-30 seconds hold - Supine Quadriceps Stretch with Strap on Table  - 1-3 x daily - 7 x weekly - 1 sets - 3 reps - 20-30 seconds hold - Supine Bridge  - 1 x daily - 7 x weekly - 2-3 sets - 10 reps - 5 seconds hold - Marching Bridge  - 1 x daily - 7 x weekly - 2-3 sets - 10 reps - 5 seconds hold - Standing Terminal Knee Extension with Resistance  - 1 x daily - 7 x weekly - 2-3 sets - 10 reps - 5 seconds hold - Standing Hip Flexion with Resistance (Mirrored)  - 1 x daily - 7 x weekly - 1 sets - 10 reps - Standing Hip Adduction with Resistance (Mirrored)  - 1 x daily - 7 x weekly - 1 sets - 10 reps - Standing Hip Extension with Resistance  - 1 x daily - 7 x weekly - 1 sets - 10 reps - Standing Hip Abduction with Theraband Resistance  - 1 x daily - 7 x weekly - 1 sets - 10 reps   Do each exercise 1-2  times per day Do each exercise 5-10 repetitions Hold each exercise for 2 seconds to feel your location  AT [place].  Try to find this position when standing still for activities.   USE TAPE ON FLOOR TO MARK THE MIDLINE POSITION which is even with middle of sink.  You also should try to feel with your limb pressure in socket.  You are trying to feel with limb what you used to feel with the bottom of your foot.  Side to Side Shift: Moving your hips only (not shoulders): move weight onto your left leg, HOLD/FEEL pressure in socket.  Move back to equal weight on each leg, HOLD/FEEL pressure in socket. Move weight onto your right leg, HOLD/FEEL pressure in socket. Move back to equal weight on each leg, HOLD/FEEL pressure in socket. Repeat.  Start with both hands on sink, progress to hand on prosthetic side only, then no hands.  Front to Back Shift: Moving your hips only (not shoulders): move your weight forward onto your toes, HOLD/FEEL pressure in  socket. Move your weight back  to equal Flat Foot on both legs, HOLD/FEEL  pressure in socket. Move your weight back onto your heels, HOLD/FEEL  pressure in socket. Move your weight back to equal on both legs, HOLD/FEEL  pressure in socket. Repeat.  Start with both hands on sink, progress to hand on prosthetic side only, then no hands.  Moving Cones / Cups: With equal weight on each leg: Hold on with one hand the first time, then progress to no hand supports. Move cups from one side of sink to the other. Place cups ~2" out of your reach, progress to 10" beyond reach.  Place one hand in middle of sink and reach with other hand. Do both arms.  Then hover one hand and move cups with other hand.  Overhead/Upward Reaching: alternated reaching up to top cabinets or ceiling if no cabinets present. Keep equal weight on each leg. Start with one hand support on counter while other hand reaches and progress to no hand support with reaching.  ace one hand in middle of sink and reach with other hand. Do both arms.  Then hover one hand and move cups with other hand.  5.   Looking Over Shoulders: With equal weight on each leg: alternate turning to look over your shoulders with one hand support on counter as needed.  Start with head motions only to look in front of shoulder, then even with shoulder and progress to looking behind you. To look to side, move head /eyes, then shoulder on side looking pulls back, shift more weight to side looking and pull hip back. Place one hand in middle of sink and let go with other hand so your shoulder can pull back. Switch hands to look other way.   Then hover one hand and look over shoulder. If looking right, use left hand at sink. If looking left, use right hand at sink. 6.  Stepping with leg that is not amputated:  Move items under cabinet out of your way. Shift your hips/pelvis so weight on prosthesis. Tighten muscles in hip on prosthetic side.  SLOWLY step other leg so front of foot is in cabinet. Then step back to  floor.      ASSESSMENT:   CLINICAL IMPRESSION: Pt. has attended 10 visits and made significant progress.  See objective data for updated information.  Pt. has made gains towards improving his mobility with prosthesis for community accessibility without w/c.  Pt. may continue to benefit from skilled PT services to continue progression towards reaching established goals and reduced risk of falls due to presentation   OBJECTIVE IMPAIRMENTS: Abnormal gait, decreased activity tolerance, decreased balance, decreased endurance, decreased knowledge of condition, decreased knowledge of use of DME, decreased mobility, decreased ROM, decreased strength, increased edema, impaired flexibility, postural dysfunction, prosthetic dependency , obesity, and wound on residual limb .    ACTIVITY LIMITATIONS: carrying, lifting, bending, standing, squatting, stairs, transfers, locomotion level, and prosthetic use   PARTICIPATION LIMITATIONS: meal prep, cleaning, laundry, driving, community activity, occupation, and yard work   PERSONAL FACTORS: Fitness, Time since onset of injury/illness/exacerbation, and 3+ comorbidities: see PMH  are also affecting patient's functional outcome.    REHAB POTENTIAL: Good   CLINICAL DECISION MAKING: Evolving/moderate complexity   EVALUATION COMPLEXITY: Moderate     GOALS: Goals reviewed with patient? Yes   SHORT TERM GOALS: Target date: 05/16/2022   Patient donnes prosthesis modified independent & verbalizes proper cleaning. Baseline: SEE OBJECTIVE DATA Goal status: MET 05/12/2022 2.  Patient tolerates prosthesis >12 hrs total /day without skin issues or limb pain after standing. Baseline: SEE OBJECTIVE DATA Goal status: progressing but not MET 05/12/2022   3. Berg Balance >36/56 Baseline: SEE OBJECTIVE DATA Goal status:  MET 05/12/2022   4. Patient ambulates 200' with cane & prosthesis with supervision. Baseline: SEE OBJECTIVE DATA Goal status: MET 05/12/2022    5. Patient negotiates ramps & curbs with cane & prosthesis with minA. Baseline: SEE OBJECTIVE DATA Goal status: MET 05/12/2022   LONG TERM GOALS: Target date: 07/31/2021   Patient demonstrates & verbalized understanding of prosthetic care to enable safe utilization of prosthesis. Baseline: SEE OBJECTIVE DATA Goal status: INITIAL   Patient tolerates prosthesis wear >90% of awake hours without skin or limb pain issues. Baseline: SEE OBJECTIVE DATA Goal status: INITIAL   Functional Gait Assessment >22/30 to indicate lower fall risk Baseline: SEE OBJECTIVE DATA Goal status: INITIAL   Patient ambulates >500' with prosthesis only independently Baseline: SEE OBJECTIVE DATA Goal status: INITIAL   Patient negotiates ramps, curbs & stairs with single rail with prosthesis only independently. Baseline: SEE OBJECTIVE DATA Goal status: INITIAL   Patient demonstrates & verbalizes lifting, carrying, pushing, pulling with prosthesis only safely. Baseline: SEE OBJECTIVE DATA Goal status: INITIAL     PLAN:   PT FREQUENCY: 3x/week for 5 weeks, then 1-2 x/wk for 10 weeks   PT DURATION: other: 15 weeks   PLANNED INTERVENTIONS: Therapeutic exercises, Therapeutic activity, Neuromuscular re-education, Balance training, Gait training, Patient/Family education, Self Care, Stair training, Vestibular training, Prosthetic training, DME instructions, and physical performance testing   PLAN FOR NEXT SESSION:  continue prosthetic gait with & without cane, balance activities, progress wear depending on limb pain   Jamey Reas, PT, DPT 05/12/2022, 12:24 PM

## 2022-05-13 ENCOUNTER — Other Ambulatory Visit (INDEPENDENT_AMBULATORY_CARE_PROVIDER_SITE_OTHER): Payer: 59

## 2022-05-13 ENCOUNTER — Other Ambulatory Visit: Payer: Self-pay | Admitting: Endocrinology

## 2022-05-13 DIAGNOSIS — E1165 Type 2 diabetes mellitus with hyperglycemia: Secondary | ICD-10-CM

## 2022-05-13 DIAGNOSIS — Z794 Long term (current) use of insulin: Secondary | ICD-10-CM | POA: Diagnosis not present

## 2022-05-13 LAB — COMPREHENSIVE METABOLIC PANEL
ALT: 21 U/L (ref 0–53)
AST: 17 U/L (ref 0–37)
Albumin: 4 g/dL (ref 3.5–5.2)
Alkaline Phosphatase: 131 U/L — ABNORMAL HIGH (ref 39–117)
BUN: 32 mg/dL — ABNORMAL HIGH (ref 6–23)
CO2: 29 mEq/L (ref 19–32)
Calcium: 10 mg/dL (ref 8.4–10.5)
Chloride: 105 mEq/L (ref 96–112)
Creatinine, Ser: 1.41 mg/dL (ref 0.40–1.50)
GFR: 53.77 mL/min — ABNORMAL LOW (ref >60.00)
Glucose, Bld: 99 mg/dL (ref 70–99)
Potassium: 4.8 mEq/L (ref 3.5–5.1)
Sodium: 141 mEq/L (ref 135–145)
Total Bilirubin: 0.4 mg/dL (ref 0.2–1.2)
Total Protein: 8.2 g/dL (ref 6.0–8.3)

## 2022-05-13 LAB — HEMOGLOBIN A1C: Hgb A1c MFr Bld: 9.2 % — ABNORMAL HIGH (ref 4.6–6.5)

## 2022-05-14 ENCOUNTER — Encounter: Payer: 59 | Admitting: Physical Therapy

## 2022-05-14 NOTE — Therapy (Incomplete)
OUTPATIENT PHYSICAL THERAPY TREATMENT NOTE   Patient Name: Raymond [name] MRN: [id number] DOB:1961/05/02, 61 y.o., male Today's Date: 05/14/2022  PCP: [name], FNP  REFERRING PROVIDER: Newt Minion, MD       END OF SESSION:          Past Medical History:  Diagnosis Date   Diabetes [facility])    Diabetes mellitus without complication ([place])    type 2   ESRD (end stage renal disease) ([place])    03/09/2015- patient had a kidney transplant   Hypertension    Peripheral vascular disease ([place])    Renal disorder    Renal insufficiency    Past Surgical History:  Procedure Laterality Date   ABDOMINAL AORTOGRAM W/LOWER EXTREMITY N/A 09/11/2021   Procedure: ABDOMINAL AORTOGRAM W/LOWER EXTREMITY;  Surgeon: Wellington Hampshire, MD;  Location: [place] [facility];  Service: Cardiovascular;  Laterality: N/A;   AMPUTATION Right 09/25/2021   Procedure: RIGHT TRANSMETATARSAL AMPUTATION AND APPLY TISSUE GRAFT;  Surgeon: Newt Minion, MD;  Location: [place];  Service: Orthopedics;  Laterality: Right;   AMPUTATION Right 11/16/2021   Procedure: RIGHT AMPUTATION BELOW KNEE WITH WOUND VAC APPLICATION;  Surgeon: Newt Minion, MD;  Location: [place];  Service: Orthopedics;  Laterality: Right;   BONE BIOPSY Right 03/06/2021   Procedure: BONE BIOPSY;  Surgeon: Trula Slade, DPM;  Location: [facility];  Service: Podiatry;  Laterality: Right;   CENTRAL VENOUS CATHETER INSERTION Right 01/20/2020   Procedure: INSERTION CENTRAL LINE ADULT; Tunneled central line;  Surgeon: Virl Cagey, MD;  Location: [facility];  Service: General;  Laterality: Right;   COLONOSCOPY N/A 12/05/2014   WUJ:[id number] external and internal hemorrhoid/mild diverticulosis/11 polyps removed   ESOPHAGOGASTRODUODENOSCOPY N/A 12/05/2014   GNF:AOZH duodenitis   GRAFT APPLICATION Right 08/65/7846   Procedure: GRAFT APPLICATION;  Surgeon: Trula Slade, DPM;  Location: [facility];  Service: Podiatry;  Laterality: Right;    INCISION AND DRAINAGE OF WOUND Right 08/15/2021   Procedure: IRRIGATION AND DEBRIDEMENT WOUND;  Surgeon: Landis Martins, DPM;  Location: [facility];  Service: Podiatry;  Laterality: Right;  need to make card not  a irrigation and debridement card   IR FLUORO GUIDE CV LINE RIGHT  03/01/2019   IR PERC TUN PERIT CATH WO PORT S&I /IMAG  06/27/2021   IR REMOVAL TUN CV CATH W/O FL  05/10/2019   IR REMOVAL TUN CV CATH W/O FL  02/29/2020   IR REMOVAL TUN CV CATH W/O FL  07/19/2021   IR US GUIDE VASC ACCESS RIGHT  03/01/2019   IR US GUIDE VASC ACCESS RIGHT  06/27/2021   IRRIGATION AND DEBRIDEMENT ELBOW Left 06/24/2021   Procedure: IRRIGATION AND DEBRIDEMENT ELBOW;  Surgeon: Lovell Sheehan, MD;  Location: [facility];  Service: Orthopedics;  Laterality: Left;   KIDNEY TRANSPLANT  03/27/2015   Penile Pump Insertion     PERIPHERAL VASCULAR ATHERECTOMY  09/11/2021   Procedure: PERIPHERAL VASCULAR ATHERECTOMY;  Surgeon: Wellington Hampshire, MD;  Location: [place] [facility];  Service: Cardiovascular;;  Shockwave Lithotripsy   TEE WITHOUT CARDIOVERSION N/A 01/17/2020   Procedure: TRANSESOPHAGEAL ECHOCARDIOGRAM (TEE) WITH PROPOFOL;  Surgeon: Arnoldo Lenis, MD;  [facility];  Service: Endoscopy;  Laterality: N/A;   WOUND DEBRIDEMENT Right 03/06/2021   Procedure: DEBRIDEMENT WOUND;  Surgeon: Trula Slade, DPM;  [facility];  Service: Podiatry;  Laterality: Right;   Patient Active Problem List   Diagnosis Date Noted   Cutaneous abscess of right  foot    Hyperkalemia 08/28/2021   Obesity (BMI 30-39.9) 08/28/2021   History of anemia due to chronic kidney disease 08/28/2021   Diabetic foot ulcer ([place]) 08/14/2021   Hypomagnesemia 06/25/2021   Septic arthritis of elbow, left ([place]) 06/24/2021   Peripheral vascular disease () 04/24/2020   Pyogenic inflammation of bone ([facility])    Thrombophlebitis of superficial veins of right lower extremity    Difficult intravenous access    MRSA bacteremia     Diabetic foot infection ([place])    Infected wound 01/12/2020   Critical lower limb ischemia ([place]) 02/02/2019   Personal history of noncompliance with medical treatment, presenting hazards to health 10/07/2018   Critical limb ischemia with history of revascularization of same extremity ([place]) 07/27/2018   Mixed hyperlipidemia 02/19/2018   History of renal transplant 02/19/2018   Conductive hearing loss, bilateral 08/31/2017   Nuclear sclerotic cataract of right eye 10/07/2016   Pseudophakia of left eye 10/07/2016   Hypertension due to endocrine disorder 03/04/2016   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 ([place]) 03/04/2016   Renal failure 03/04/2016   Proliferative diabetic retinopathy with macular edema associated with type 2 diabetes mellitus ([place]) 03/04/2016   Immunosuppression ([place]) 12/10/2015   Nausea vomiting and diarrhea 12/10/2015   Type 2 diabetes mellitus ([place]) 12/10/2015   Essential hypertension, benign 12/10/2015   Pancytopenia ([place]) 12/10/2015   Encounter for screening colonoscopy 11/15/2014   GERD (gastroesophageal reflux disease) 11/15/2014    REFERRING DIAG: D98.338S (ICD-10-CM) - Below-knee amputation of right lower extremit   ONSET DATE: 04/10/2022 prosthesis delivery   THERAPY DIAG:  No diagnosis found.  Rationale for Evaluation and Treatment Rehabilitation  PERTINENT HISTORY: DM2, PVD, ESRD s/p renal transplant 03/09/2015 on immunosuppressive meds, HTN   PRECAUTIONS: None  SUBJECTIVE:                                                                                                                                                                                      SUBJECTIVE STATEMENT: *** He has been doing his exercises. He wore prosthesis 8 hrs total without flexible inner liner.   PAIN:  Are you having pain? Residual limb pain *** 3/10 soreness from wearing prosthesis without ability to seat  limb into socket.   OBJECTIVE: (objective measures completed at initial evaluation unless otherwise dated) POSTURE: rounded shoulders, forward head, flexed trunk , and weight shift left   LOWER EXTREMITY ROM:   ROM P:passive  A:active Right eval Left eval  Hip flexion      Hip extension      Hip abduction      Hip  adduction      Hip internal rotation      Hip external rotation      Knee flexion 100*    Knee extension -5*    Ankle dorsiflexion      Ankle plantarflexion      Ankle inversion      Ankle eversion       (Blank rows = not tested)   LOWER EXTREMITY MMT:   MMT Right eval Left eval  Hip flexion 3+/5    Hip extension 3-/5    Hip abduction 3-/5    Hip adduction      Hip internal rotation      Hip external rotation      Knee flexion 3-/5    Knee extension 3/5    Ankle dorsiflexion      Ankle plantarflexion      Ankle inversion      Ankle eversion      (Blank rows = not tested)   TRANSFERS: Sit to stand: 18" chair with armrests to RW with supervision.  Stand to sit: RW to 18" chair with armrests with supervision   PROSTHETIC GAIT WITH BKA PROSTHESIS: Gait pattern: step to pattern, decreased arm swing- Right, decreased step length- Right, decreased stance time- Left, decreased stride length, decreased hip/knee flexion- Right, circumduction- Right, Right hip hike, knee flexed in stance- Right, antalgic, lateral hip instability, trunk flexed, and abducted- Right Distance walked: 73' with RW & 20' with cane Assistive device utilized: Single point cane, Walker - 2 wheeled, and BKA prosthesis Level of assistance: SBA with RW and Min A Gait velocity: 0.89 ft/sec Comments: excessive weight bearing on RW, partial weight on prosthesis   FUNCTIONAL TESTs:  05/12/2022:  Merrilee Jansky Balance test 42/56 TUG with prosthesis only: std 9.96sec & cog 10.94sec      04/15/2022: Timed up and go (TUG): 39.25sec with cane & BKA prosthesis Berg Balance Scale: 24/56      CURRENT PROSTHETIC WEAR ASSESSMENT: Patient is dependent with: skin check, residual limb care, care of non-amputated limb, prosthetic cleaning, ply sock cleaning, correct ply sock adjustment, proper wear schedule/adjustment, and proper weight-bearing schedule/adjustment Donning prosthesis: Min A Doffing prosthesis: SBA Prosthetic wear tolerance: 3 hours, 1-2x/day, 5 of 5 days since delivery Prosthetic weight bearing tolerance: 5 minutes Edema: pitting. Residual limb condition: 43m scab wound on medial incision, dry skin, normal color & temperature, cylindrical shape Prosthetic description: silicon liner with pin lock suspension, total contact socket, hydraulic ankle / K3 foot K code/activity level with prosthetic use: Level 3       TODAY'S TREATMENT:                                                                                                                             DATE:  05/14/2022: ***  05/12/2022: Prosthetic Training with TTA prosthesis: PT recommending wear 4hrs 3x/day with off time 2 hours as limb is still tender. Pt verbalized understanding.  PT recommended using  cane for gait beyond short distances around house as only minimal antalgic pattern. Pt verbalized understanding. Pt amb 200' with cane including neg ramp & curb with general cues for deviations no LOB.  Therapeutic Activities: PT demo & verbal cues on floor transfer via half kneeling pushing on chair bottom. Pt performed with ea LE forward 1 rep. Lifting 20# kettle bell 5 reps with PT cues on technique.   Neuromuscular Re-ed for balance: Alternate tapping feet into lower cabinet 1 min. Working on [employer] tennis ball forward/backward, medial/lateral & circles CW / CCW 10 reps ea LE ea motion light LUE support.  05/07/2022: Prosthetic Training with TTA prosthesis: Pt ambulated without assistive device with RLE (prosthetic) knee flexion in stance with decreased stance time.  PT demo & verbal cues on  sit/stand without UE assist and initiating gait with decreased hesitancy. Pt performed 5 reps.  Stance RLE with knee ext & upright trunk tapping LLE into lower cabinet 15 reps.  PT recommended building wear back up to 4hrs on, 2hrs off 3x/day using limb pain as guide. Use socks not flexible inner liner for now.  May try again once wearing >10-ply socks and no limb pain. Pt verbalized understanding.   Therapeutic Exercise: PT instructed in HEP Access Code: [email or web address] with demo, verbal & HO cues.  Pt performed HEP as noted. TKE with black theraband & kicks with red theraband. Pt verbalizes understanding including rational for each exercise.    HOME EXERCISE PROGRAM: Access Code: [facility] URL: [email or web address] Date: 05/07/2022 Prepared by: Jamey Reas  Exercises - Hip Flexor Stretch at [facility] of Bed  - 1-3 x daily - 7 x weekly - 1 sets - 3 reps - 20-30 seconds hold - Supine Quadriceps Stretch with Strap on Table  - 1-3 x daily - 7 x weekly - 1 sets - 3 reps - 20-30 seconds hold - Supine Bridge  - 1 x daily - 7 x weekly - 2-3 sets - 10 reps - 5 seconds hold - Marching Bridge  - 1 x daily - 7 x weekly - 2-3 sets - 10 reps - 5 seconds hold - Standing Terminal Knee Extension with Resistance  - 1 x daily - 7 x weekly - 2-3 sets - 10 reps - 5 seconds hold - Standing Hip Flexion with Resistance (Mirrored)  - 1 x daily - 7 x weekly - 1 sets - 10 reps - Standing Hip Adduction with Resistance (Mirrored)  - 1 x daily - 7 x weekly - 1 sets - 10 reps - Standing Hip Extension with Resistance  - 1 x daily - 7 x weekly - 1 sets - 10 reps - Standing Hip Abduction with Theraband Resistance  - 1 x daily - 7 x weekly - 1 sets - 10 reps   Do each exercise 1-2  times per day Do each exercise 5-10 repetitions Hold each exercise for 2 seconds to feel your location  AT [place].  Try to find this position when standing still  for activities.   USE TAPE ON FLOOR TO MARK THE MIDLINE POSITION which is even with middle of sink.  You also should try to feel with your limb pressure in socket.  You are trying to feel with limb what you used to feel with the bottom of your foot.  Side to Side Shift: Moving your hips only (not shoulders): move weight onto your left leg, HOLD/FEEL pressure in  socket.  Move back to equal weight on each leg, HOLD/FEEL pressure in socket. Move weight onto your right leg, HOLD/FEEL pressure in socket. Move back to equal weight on each leg, HOLD/FEEL pressure in socket. Repeat.  Start with both hands on sink, progress to hand on prosthetic side only, then no hands.  Front to Back Shift: Moving your hips only (not shoulders): move your weight forward onto your toes, HOLD/FEEL pressure in socket. Move your weight back to equal Flat Foot on both legs, HOLD/FEEL  pressure in socket. Move your weight back onto your heels, HOLD/FEEL  pressure in socket. Move your weight back to equal on both legs, HOLD/FEEL  pressure in socket. Repeat.  Start with both hands on sink, progress to hand on prosthetic side only, then no hands.  Moving Cones / Cups: With equal weight on each leg: Hold on with one hand the first time, then progress to no hand supports. Move cups from one side of sink to the other. Place cups ~2" out of your reach, progress to 10" beyond reach.  Place one hand in middle of sink and reach with other hand. Do both arms.  Then hover one hand and move cups with other hand.  Overhead/Upward Reaching: alternated reaching up to top cabinets or ceiling if no cabinets present. Keep equal weight on each leg. Start with one hand support on counter while other hand reaches and progress to no hand support with reaching.  ace one hand in middle of sink and reach with other hand. Do both arms.  Then hover one hand and move cups with other hand.  5.   Looking Over Shoulders: With equal weight on each leg: alternate  turning to look over your shoulders with one hand support on counter as needed.  Start with head motions only to look in front of shoulder, then even with shoulder and progress to looking behind you. To look to side, move head /eyes, then shoulder on side looking pulls back, shift more weight to side looking and pull hip back. Place one hand in middle of sink and let go with other hand so your shoulder can pull back. Switch hands to look other way.   Then hover one hand and look over shoulder. If looking right, use left hand at sink. If looking left, use right hand at sink. 6.  Stepping with leg that is not amputated:  Move items under cabinet out of your way. Shift your hips/pelvis so weight on prosthesis. Tighten muscles in hip on prosthetic side.  SLOWLY step other leg so front of foot is in cabinet. Then step back to floor.      ASSESSMENT:   CLINICAL IMPRESSION: *** Pt. has attended 10 visits and made significant progress.  See objective data for updated information.  Pt. has made gains towards improving his mobility with prosthesis for community accessibility without w/c.  Pt. may continue to benefit from skilled PT services to continue progression towards reaching established goals and reduced risk of falls due to presentation   OBJECTIVE IMPAIRMENTS: Abnormal gait, decreased activity tolerance, decreased balance, decreased endurance, decreased knowledge of condition, decreased knowledge of use of DME, decreased mobility, decreased ROM, decreased strength, increased edema, impaired flexibility, postural dysfunction, prosthetic dependency , obesity, and wound on residual limb .    ACTIVITY LIMITATIONS: carrying, lifting, bending, standing, squatting, stairs, transfers, locomotion level, and prosthetic use   PARTICIPATION LIMITATIONS: meal prep, cleaning, laundry, driving, community activity, occupation, and yard work   PERSONAL  FACTORS: Fitness, Time since onset of injury/illness/exacerbation,  and 3+ comorbidities: see PMH  are also affecting patient's functional outcome.    REHAB POTENTIAL: Good   CLINICAL DECISION MAKING: Evolving/moderate complexity   EVALUATION COMPLEXITY: Moderate     GOALS: Goals reviewed with patient? Yes   SHORT TERM GOALS: Target date: 05/16/2022   Patient donnes prosthesis modified independent & verbalizes proper cleaning. Baseline: SEE OBJECTIVE DATA Goal status: MET 05/12/2022 2.  Patient tolerates prosthesis >12 hrs total /day without skin issues or limb pain after standing. Baseline: SEE OBJECTIVE DATA Goal status: progressing but not MET 05/12/2022   3. Berg Balance >36/56 Baseline: SEE OBJECTIVE DATA Goal status:  MET 05/12/2022   4. Patient ambulates 200' with cane & prosthesis with supervision. Baseline: SEE OBJECTIVE DATA Goal status: MET 05/12/2022   5. Patient negotiates ramps & curbs with cane & prosthesis with minA. Baseline: SEE OBJECTIVE DATA Goal status: MET 05/12/2022   LONG TERM GOALS: Target date: 07/31/2021   Patient demonstrates & verbalized understanding of prosthetic care to enable safe utilization of prosthesis. Baseline: SEE OBJECTIVE DATA Goal status: INITIAL   Patient tolerates prosthesis wear >90% of awake hours without skin or limb pain issues. Baseline: SEE OBJECTIVE DATA Goal status: INITIAL   Functional Gait Assessment >22/30 to indicate lower fall risk Baseline: SEE OBJECTIVE DATA Goal status: INITIAL   Patient ambulates >500' with prosthesis only independently Baseline: SEE OBJECTIVE DATA Goal status: INITIAL   Patient negotiates ramps, curbs & stairs with single rail with prosthesis only independently. Baseline: SEE OBJECTIVE DATA Goal status: INITIAL   Patient demonstrates & verbalizes lifting, carrying, pushing, pulling with prosthesis only safely. Baseline: SEE OBJECTIVE DATA Goal status: INITIAL     PLAN:   PT FREQUENCY: 3x/week for 5 weeks, then 1-2 x/wk for 10 weeks   PT  DURATION: other: 15 weeks   PLANNED INTERVENTIONS: Therapeutic exercises, Therapeutic activity, Neuromuscular re-education, Balance training, Gait training, Patient/Family education, Self Care, Stair training, Vestibular training, Prosthetic training, DME instructions, and physical performance testing   PLAN FOR NEXT SESSION:  *** continue prosthetic gait with & without cane, balance activities, progress wear depending on limb pain   Jamey Reas, PT, DPT 05/14/2022, 7:38 AM

## 2022-05-15 ENCOUNTER — Ambulatory Visit (INDEPENDENT_AMBULATORY_CARE_PROVIDER_SITE_OTHER): Payer: 59 | Admitting: Physical Therapy

## 2022-05-15 ENCOUNTER — Ambulatory Visit: Payer: 59 | Admitting: Endocrinology

## 2022-05-15 ENCOUNTER — Encounter: Payer: Self-pay | Admitting: Physical Therapy

## 2022-05-15 DIAGNOSIS — M6281 Muscle weakness (generalized): Secondary | ICD-10-CM

## 2022-05-15 DIAGNOSIS — R2689 Other abnormalities of gait and mobility: Secondary | ICD-10-CM

## 2022-05-15 DIAGNOSIS — R2681 Unsteadiness on feet: Secondary | ICD-10-CM

## 2022-05-15 NOTE — Therapy (Signed)
OUTPATIENT PHYSICAL THERAPY TREATMENT NOTE   Patient Name: Raymond [name] MRN: [id number] DOB:1961/04/29, 61 y.o., male Today's Date: 05/15/2022  PCP: [name], FNP  REFERRING PROVIDER: Newt Minion, MD       END OF SESSION:   PT End of Session - 05/15/22 0941     Visit Number 11    Number of Visits 25    Date for PT Re-Evaluation 08/01/22    Authorization Type [employer]ET[employer]    Authorization Time Period NO COPAY, 35 PT/OT VISITS Calendar Year Benefit Date 05/26/2021 - 05/25/2022    Authorization - Number of Visits 35    Progress Note Due on Visit 20    PT Start Time 0930    PT Stop Time 1013    PT Time Calculation (min) 43 min    Equipment Utilized During Treatment Gait belt    Activity Tolerance Patient tolerated treatment well    Behavior During Therapy WFL for tasks assessed/performed                   Past Medical History:  Diagnosis Date   Diabetes ([place])    Diabetes mellitus without complication ([place])    type 2   ESRD (end stage renal disease) ([place])    03/09/2015- patient had a kidney transplant   Hypertension    Peripheral vascular disease ([place])    Renal disorder    Renal insufficiency    Past Surgical History:  Procedure Laterality Date   ABDOMINAL AORTOGRAM W/LOWER EXTREMITY N/A 09/11/2021   Procedure: ABDOMINAL AORTOGRAM W/LOWER EXTREMITY;  Surgeon: Wellington Hampshire, MD;  Location: [place];  Service: Cardiovascular;  Laterality: N/A;   AMPUTATION Right 09/25/2021   Procedure: RIGHT TRANSMETATARSAL AMPUTATION AND APPLY TISSUE GRAFT;  Surgeon: Newt Minion, MD;  Location: [facility];  Service: Orthopedics;  Laterality: Right;   AMPUTATION Right 11/16/2021   Procedure: RIGHT AMPUTATION BELOW KNEE WITH WOUND VAC APPLICATION;  Surgeon: Newt Minion, MD;  Location: [place];  Service: Orthopedics;  Laterality: Right;   BONE BIOPSY Right 03/06/2021   Procedure: BONE BIOPSY;  Surgeon: Trula Slade, DPM;  Location: [facility];  Service:  Podiatry;  Laterality: Right;   CENTRAL VENOUS CATHETER INSERTION Right 01/20/2020   Procedure: INSERTION CENTRAL LINE ADULT; Tunneled central line;  Surgeon: Virl Cagey, MD;  Location: [facility];  Service: General;  Laterality: Right;   COLONOSCOPY N/A 12/05/2014   KGU:RKYHCWCB external and internal hemorrhoid/mild diverticulosis/11 polyps removed   ESOPHAGOGASTRODUODENOSCOPY N/A 12/05/2014   JSE:GBTD duodenitis   GRAFT APPLICATION Right 17/61/6073   Procedure: GRAFT APPLICATION;  Surgeon: Trula Slade, DPM;  Location: [facility];  Service: Podiatry;  Laterality: Right;   INCISION AND DRAINAGE OF WOUND Right 08/15/2021   Procedure: IRRIGATION AND DEBRIDEMENT WOUND;  Surgeon: Landis Martins, DPM;  Location: [facility];  Service: Podiatry;  Laterality: Right;  need to make card not  a irrigation and debridement card   IR FLUORO GUIDE CV LINE RIGHT  03/01/2019   IR PERC TUN PERIT CATH WO PORT S&I /IMAG  06/27/2021   IR REMOVAL TUN CV CATH W/O FL  05/10/2019   IR REMOVAL TUN CV CATH W/O FL  02/29/2020   IR REMOVAL TUN CV CATH W/O FL  07/19/2021   IR US GUIDE VASC ACCESS RIGHT  03/01/2019   IR US GUIDE VASC ACCESS RIGHT  06/27/2021   IRRIGATION AND DEBRIDEMENT ELBOW Left 06/24/2021   Procedure: IRRIGATION AND DEBRIDEMENT ELBOW;  Surgeon: Harlow Mares,  Elyn Aquas, MD;  Location: [facility];  Service: Orthopedics;  Laterality: Left;   KIDNEY TRANSPLANT  03/27/2015   Penile Pump Insertion     PERIPHERAL VASCULAR ATHERECTOMY  09/11/2021   Procedure: PERIPHERAL VASCULAR ATHERECTOMY;  Surgeon: Wellington Hampshire, MD;  Location: [place] [facility];  Service: Cardiovascular;;  Shockwave Lithotripsy   TEE WITHOUT CARDIOVERSION N/A 01/17/2020   Procedure: TRANSESOPHAGEAL ECHOCARDIOGRAM (TEE) WITH PROPOFOL;  Surgeon: Arnoldo Lenis, MD;  [facility];  Service: Endoscopy;  Laterality: N/A;   WOUND DEBRIDEMENT Right 03/06/2021   Procedure: DEBRIDEMENT WOUND;  Surgeon: Trula Slade, DPM;  [facility];  Service: Podiatry;  Laterality: Right;   Patient Active Problem List   Diagnosis Date Noted   Cutaneous abscess of right foot    Hyperkalemia 08/28/2021   Obesity (BMI 30-39.9) 08/28/2021   History of anemia due to chronic kidney disease 08/28/2021   Diabetic foot ulcer ([place]) 08/14/2021   Hypomagnesemia 06/25/2021   Septic arthritis of elbow, left ([place]) 06/24/2021   Peripheral vascular disease ([place]) 04/24/2020   Pyogenic inflammation of bone ([facility])    Thrombophlebitis of superficial veins of right lower extremity    Difficult intravenous access    MRSA bacteremia    Diabetic foot infection ([place])    Infected wound 01/12/2020   Critical lower limb ischemia ([place]) 02/02/2019   Personal history of noncompliance with medical treatment, presenting hazards to health 10/07/2018   Critical limb ischemia with history of revascularization of same extremity ([place]) 07/27/2018   Mixed hyperlipidemia 02/19/2018   History of renal transplant 02/19/2018   Conductive hearing loss, bilateral 08/31/2017   Nuclear sclerotic cataract of right eye 10/07/2016   Pseudophakia of left eye 10/07/2016   Hypertension due to endocrine disorder 03/04/2016   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 ([place]) 03/04/2016   Renal failure 03/04/2016   Proliferative diabetic retinopathy with macular edema associated with type 2 diabetes mellitus ([name]) 03/04/2016   Immunosuppression ([place]) 12/10/2015   Nausea vomiting and diarrhea 12/10/2015   Type 2 diabetes mellitus ([place]) 12/10/2015   Essential hypertension, benign 12/10/2015   Pancytopenia ([place]) 12/10/2015   Encounter for screening colonoscopy 11/15/2014   GERD (gastroesophageal reflux disease) 11/15/2014    REFERRING DIAG: K16.010X (ICD-10-CM) - Below-knee amputation of right lower extremit   ONSET DATE: 04/10/2022 prosthesis delivery   THERAPY DIAG:  Unsteadiness on feet  Other  abnormalities of gait and mobility  Muscle weakness (generalized)  Rationale for Evaluation and Treatment Rehabilitation  PERTINENT HISTORY: DM2, PVD, ESRD s/p renal transplant 03/09/2015 on immunosuppressive meds, HTN   PRECAUTIONS: None  SUBJECTIVE:  SUBJECTIVE STATEMENT: His blood glucose has been dropping.     PAIN:  Are you having pain? Residual limb pain 0/10 soreness from wearing prosthesis without ability to seat limb into socket.   OBJECTIVE: (objective measures completed at initial evaluation unless otherwise dated) POSTURE: rounded shoulders, forward head, flexed trunk , and weight shift left   LOWER EXTREMITY ROM:   ROM P:passive  A:active Right eval Left eval  Hip flexion      Hip extension      Hip abduction      Hip adduction      Hip internal rotation      Hip external rotation      Knee flexion 100*    Knee extension -5*    Ankle dorsiflexion      Ankle plantarflexion      Ankle inversion      Ankle eversion       (Blank rows = not tested)   LOWER EXTREMITY MMT:   MMT Right eval Left eval  Hip flexion 3+/5    Hip extension 3-/5    Hip abduction 3-/5    Hip adduction      Hip internal rotation      Hip external rotation      Knee flexion 3-/5    Knee extension 3/5    Ankle dorsiflexion      Ankle plantarflexion      Ankle inversion      Ankle eversion      (Blank rows = not tested)   TRANSFERS: Sit to stand: 18" chair with armrests to RW with supervision.  Stand to sit: RW to 18" chair with armrests with supervision   PROSTHETIC GAIT WITH BKA PROSTHESIS: Gait pattern: step to pattern, decreased arm swing- Right, decreased step length- Right, decreased stance time- Left, decreased stride length, decreased hip/knee flexion- Right, circumduction- Right, Right hip  hike, knee flexed in stance- Right, antalgic, lateral hip instability, trunk flexed, and abducted- Right Distance walked: 17' with RW & 20' with cane Assistive device utilized: Single point cane, Walker - 2 wheeled, and BKA prosthesis Level of assistance: SBA with RW and Min A Gait velocity: 0.89 ft/sec Comments: excessive weight bearing on RW, partial weight on prosthesis   FUNCTIONAL TESTs:  05/12/2022:  Merrilee Jansky Balance test 42/56 TUG with prosthesis only: std 9.96sec & cog 10.94sec      04/15/2022: Timed up and go (TUG): 39.25sec with cane & BKA prosthesis Berg Balance Scale: 24/56     CURRENT PROSTHETIC WEAR ASSESSMENT: Patient is dependent with: skin check, residual limb care, care of non-amputated limb, prosthetic cleaning, ply sock cleaning, correct ply sock adjustment, proper wear schedule/adjustment, and proper weight-bearing schedule/adjustment Donning prosthesis: Min A Doffing prosthesis: SBA Prosthetic wear tolerance: 3 hours, 1-2x/day, 5 of 5 days since delivery Prosthetic weight bearing tolerance: 5 minutes Edema: pitting. Residual limb condition: 63m scab wound on medial incision, dry skin, normal color & temperature, cylindrical shape Prosthetic description: silicon liner with pin lock suspension, total contact socket, hydraulic ankle / K3 foot K code/activity level with prosthetic use: Level 3       TODAY'S TREATMENT:  DATE:  05/15/2022: Self-care: Pt has excessive fluid in BLEs.   PT educated on weighing in mornings with prosthesis and keeping log to share with MD. PT demo stepping on/off scale. Pt return demo using wall for balance.  Also PT recommended taking BP and logging so MD an monitor.  PT measured LLE for compression stocking which MD apparently recommended.  Size L with Vive Wear.   PT demo 2x & verbal cues on donning  compression stocking. PT recommended donning immediately upon arising to limit fluid setting into LE with dependency. Pt & wife verbalized understanding.  Prosthetic Training with TTA prosthesis: Pt's residual limb was so edematous this morning that >2" out of socket. He reports that he did not wear shrinker last night as got in late.  PT recommended shrinker wear at all times out of prosthesis. Pt verbalized understanding.  PT recommended wearing liner when unable to seat limb into socket during awake hours. Then he can quickly donne prosthesis to move around house. Pt verbalized understanding.  Pt ambulated 100' X 2 without device with verbal cues on step width & upright posture.    05/12/2022: Prosthetic Training with TTA prosthesis: PT recommending wear 4hrs 3x/day with off time 2 hours as limb is still tender. Pt verbalized understanding.  PT recommended using cane for gait beyond short distances around house as only minimal antalgic pattern. Pt verbalized understanding. Pt amb 200' with cane including neg ramp & curb with general cues for deviations no LOB.  Therapeutic Activities: PT demo & verbal cues on floor transfer via half kneeling pushing on chair bottom. Pt performed with ea LE forward 1 rep. Lifting 20# kettle bell 5 reps with PT cues on technique.   Neuromuscular Re-ed for balance: Alternate tapping feet into lower cabinet 1 min. Working on [employer] tennis ball forward/backward, medial/lateral & circles CW / CCW 10 reps ea LE ea motion light LUE support.  05/07/2022: Prosthetic Training with TTA prosthesis: Pt ambulated without assistive device with RLE (prosthetic) knee flexion in stance with decreased stance time.  PT demo & verbal cues on sit/stand without UE assist and initiating gait with decreased hesitancy. Pt performed 5 reps.  Stance RLE with knee ext & upright trunk tapping LLE into lower cabinet 15 reps.  PT recommended building wear back up to 4hrs on, 2hrs  off 3x/day using limb pain as guide. Use socks not flexible inner liner for now.  May try again once wearing >10-ply socks and no limb pain. Pt verbalized understanding.   Therapeutic Exercise: PT instructed in HEP Access Code: [email or web address] with demo, verbal & HO cues.  Pt performed HEP as noted. TKE with black theraband & kicks with red theraband. Pt verbalizes understanding including rational for each exercise.    HOME EXERCISE PROGRAM: Access Code: [facility] URL: [email or web address] Date: 05/07/2022 Prepared by: Jamey Reas  Exercises - Hip Flexor Stretch at [facility] of Bed  - 1-3 x daily - 7 x weekly - 1 sets - 3 reps - 20-30 seconds hold - Supine Quadriceps Stretch with Strap on Table  - 1-3 x daily - 7 x weekly - 1 sets - 3 reps - 20-30 seconds hold - Supine Bridge  - 1 x daily - 7 x weekly - 2-3 sets - 10 reps - 5 seconds hold - Marching Bridge  - 1 x daily - 7 x weekly - 2-3 sets - 10 reps - 5 seconds hold - Standing Terminal Knee Extension with Resistance  - 1 x daily -  7 x weekly - 2-3 sets - 10 reps - 5 seconds hold - Standing Hip Flexion with Resistance (Mirrored)  - 1 x daily - 7 x weekly - 1 sets - 10 reps - Standing Hip Adduction with Resistance (Mirrored)  - 1 x daily - 7 x weekly - 1 sets - 10 reps - Standing Hip Extension with Resistance  - 1 x daily - 7 x weekly - 1 sets - 10 reps - Standing Hip Abduction with Theraband Resistance  - 1 x daily - 7 x weekly - 1 sets - 10 reps   Do each exercise 1-2  times per day Do each exercise 5-10 repetitions Hold each exercise for 2 seconds to feel your location  AT No Name.  Try to find this position when standing still for activities.   USE TAPE ON FLOOR TO MARK THE MIDLINE POSITION which is even with middle of sink.  You also should try to feel with your limb pressure in socket.  You are trying to feel with limb what you used to feel with the  bottom of your foot.  Side to Side Shift: Moving your hips only (not shoulders): move weight onto your left leg, HOLD/FEEL pressure in socket.  Move back to equal weight on each leg, HOLD/FEEL pressure in socket. Move weight onto your right leg, HOLD/FEEL pressure in socket. Move back to equal weight on each leg, HOLD/FEEL pressure in socket. Repeat.  Start with both hands on sink, progress to hand on prosthetic side only, then no hands.  Front to Back Shift: Moving your hips only (not shoulders): move your weight forward onto your toes, HOLD/FEEL pressure in socket. Move your weight back to equal Flat Foot on both legs, HOLD/FEEL  pressure in socket. Move your weight back onto your heels, HOLD/FEEL  pressure in socket. Move your weight back to equal on both legs, HOLD/FEEL  pressure in socket. Repeat.  Start with both hands on sink, progress to hand on prosthetic side only, then no hands.  Moving Cones / Cups: With equal weight on each leg: Hold on with one hand the first time, then progress to no hand supports. Move cups from one side of sink to the other. Place cups ~2" out of your reach, progress to 10" beyond reach.  Place one hand in middle of sink and reach with other hand. Do both arms.  Then hover one hand and move cups with other hand.  Overhead/Upward Reaching: alternated reaching up to top cabinets or ceiling if no cabinets present. Keep equal weight on each leg. Start with one hand support on counter while other hand reaches and progress to no hand support with reaching.  ace one hand in middle of sink and reach with other hand. Do both arms.  Then hover one hand and move cups with other hand.  5.   Looking Over Shoulders: With equal weight on each leg: alternate turning to look over your shoulders with one hand support on counter as needed.  Start with head motions only to look in front of shoulder, then even with shoulder and progress to looking behind you. To look to side, move head /eyes, then  shoulder on side looking pulls back, shift more weight to side looking and pull hip back. Place one hand in middle of sink and let go with other hand so your shoulder can pull back. Switch hands to look other way.  Then hover one hand and look over shoulder. If looking right, use left hand at sink. If looking left, use right hand at sink. 6.  Stepping with leg that is not amputated:  Move items under cabinet out of your way. Shift your hips/pelvis so weight on prosthesis. Tighten muscles in hip on prosthetic side.  SLOWLY step other leg so front of foot is in cabinet. Then step back to floor.      ASSESSMENT:   CLINICAL IMPRESSION: Patient's limb was very edematous limiting standing & gait as not seated in socket.  PT instructed pt in self-care related to fluid retention which he seems to understand.   OBJECTIVE IMPAIRMENTS: Abnormal gait, decreased activity tolerance, decreased balance, decreased endurance, decreased knowledge of condition, decreased knowledge of use of DME, decreased mobility, decreased ROM, decreased strength, increased edema, impaired flexibility, postural dysfunction, prosthetic dependency , obesity, and wound on residual limb .    ACTIVITY LIMITATIONS: carrying, lifting, bending, standing, squatting, stairs, transfers, locomotion level, and prosthetic use   PARTICIPATION LIMITATIONS: meal prep, cleaning, laundry, driving, community activity, occupation, and yard work   PERSONAL FACTORS: Fitness, Time since onset of injury/illness/exacerbation, and 3+ comorbidities: see PMH  are also affecting patient's functional outcome.    REHAB POTENTIAL: Good   CLINICAL DECISION MAKING: Evolving/moderate complexity   EVALUATION COMPLEXITY: Moderate     GOALS: Goals reviewed with patient? Yes   SHORT TERM GOALS: Target date: 05/16/2022   Patient donnes prosthesis modified independent & verbalizes proper cleaning. Baseline: SEE OBJECTIVE DATA Goal status: MET 05/12/2022 2.   Patient tolerates prosthesis >12 hrs total /day without skin issues or limb pain after standing. Baseline: SEE OBJECTIVE DATA Goal status: progressing but not MET 05/12/2022   3. Berg Balance >36/56 Baseline: SEE OBJECTIVE DATA Goal status:  MET 05/12/2022   4. Patient ambulates 200' with cane & prosthesis with supervision. Baseline: SEE OBJECTIVE DATA Goal status: MET 05/12/2022   5. Patient negotiates ramps & curbs with cane & prosthesis with minA. Baseline: SEE OBJECTIVE DATA Goal status: MET 05/12/2022   LONG TERM GOALS: Target date: 07/31/2021   Patient demonstrates & verbalized understanding of prosthetic care to enable safe utilization of prosthesis. Baseline: SEE OBJECTIVE DATA Goal status: INITIAL   Patient tolerates prosthesis wear >90% of awake hours without skin or limb pain issues. Baseline: SEE OBJECTIVE DATA Goal status: INITIAL   Functional Gait Assessment >22/30 to indicate lower fall risk Baseline: SEE OBJECTIVE DATA Goal status: INITIAL   Patient ambulates >500' with prosthesis only independently Baseline: SEE OBJECTIVE DATA Goal status: INITIAL   Patient negotiates ramps, curbs & stairs with single rail with prosthesis only independently. Baseline: SEE OBJECTIVE DATA Goal status: INITIAL   Patient demonstrates & verbalizes lifting, carrying, pushing, pulling with prosthesis only safely. Baseline: SEE OBJECTIVE DATA Goal status: INITIAL     PLAN:   PT FREQUENCY: 3x/week for 5 weeks, then 1-2 x/wk for 10 weeks   PT DURATION: other: 15 weeks   PLANNED INTERVENTIONS: Therapeutic exercises, Therapeutic activity, Neuromuscular re-education, Balance training, Gait training, Patient/Family education, Self Care, Stair training, Vestibular training, Prosthetic training, DME instructions, and physical performance testing   PLAN FOR NEXT SESSION:  continue prosthetic gait with & without cane, balance activities, progress wear depending on limb pain   Jamey Reas, PT, DPT 05/15/2022, 12:32 PM

## 2022-05-21 ENCOUNTER — Ambulatory Visit (INDEPENDENT_AMBULATORY_CARE_PROVIDER_SITE_OTHER): Payer: 59 | Admitting: Physical Therapy

## 2022-05-21 ENCOUNTER — Telehealth: Payer: Self-pay | Admitting: Orthopedic Surgery

## 2022-05-21 ENCOUNTER — Encounter: Payer: Self-pay | Admitting: Physical Therapy

## 2022-05-21 DIAGNOSIS — E875 Hyperkalemia: Secondary | ICD-10-CM | POA: Diagnosis not present

## 2022-05-21 DIAGNOSIS — R2689 Other abnormalities of gait and mobility: Secondary | ICD-10-CM | POA: Diagnosis not present

## 2022-05-21 DIAGNOSIS — S81801A Unspecified open wound, right lower leg, initial encounter: Secondary | ICD-10-CM

## 2022-05-21 DIAGNOSIS — R2681 Unsteadiness on feet: Secondary | ICD-10-CM

## 2022-05-21 DIAGNOSIS — N189 Chronic kidney disease, unspecified: Secondary | ICD-10-CM | POA: Diagnosis not present

## 2022-05-21 DIAGNOSIS — M6281 Muscle weakness (generalized): Secondary | ICD-10-CM | POA: Diagnosis not present

## 2022-05-21 DIAGNOSIS — L02611 Cutaneous abscess of right foot: Secondary | ICD-10-CM | POA: Diagnosis not present

## 2022-05-21 DIAGNOSIS — E669 Obesity, unspecified: Secondary | ICD-10-CM | POA: Diagnosis not present

## 2022-05-21 DIAGNOSIS — E11621 Type 2 diabetes mellitus with foot ulcer: Secondary | ICD-10-CM | POA: Diagnosis not present

## 2022-05-21 NOTE — Telephone Encounter (Signed)
Pt came in and asked for wheel chair with seat. Please call pt at [phone]. Please call pt when done

## 2022-05-21 NOTE — Therapy (Signed)
OUTPATIENT PHYSICAL THERAPY TREATMENT NOTE   Patient Name: Raymond [name] MRN: [id number] DOB:August 06, 1960, 61 y.o., male Today's Date: 05/21/2022  PCP: [name], FNP  REFERRING PROVIDER: Newt Minion, MD       END OF SESSION:   PT End of Session - 05/21/22 1101     Visit Number 12    Number of Visits 25    Date for PT Re-Evaluation 08/01/22    Authorization Type [employer]ET[employer]    Authorization Time Period NO COPAY, 35 PT/OT VISITS Calendar Year Benefit Date 05/26/2021 - 05/25/2022    Authorization - Number of Visits 35    Progress Note Due on Visit 20    PT Start Time 1100    PT Stop Time 1142    PT Time Calculation (min) 42 min    Equipment Utilized During Treatment Gait belt    Activity Tolerance Patient tolerated treatment well    Behavior During Therapy WFL for tasks assessed/performed                    Past Medical History:  Diagnosis Date   Diabetes ([place])    Diabetes mellitus without complication ([place])    type 2   ESRD (end stage renal disease) ([place])    03/09/2015- patient had a kidney transplant   Hypertension    Peripheral vascular disease ([place])    Renal disorder    Renal insufficiency    Past Surgical History:  Procedure Laterality Date   ABDOMINAL AORTOGRAM W/LOWER EXTREMITY N/A 09/11/2021   Procedure: ABDOMINAL AORTOGRAM W/LOWER EXTREMITY;  Surgeon: Wellington Hampshire, MD;  Location: [facility];  Service: Cardiovascular;  Laterality: N/A;   AMPUTATION Right 09/25/2021   Procedure: RIGHT TRANSMETATARSAL AMPUTATION AND APPLY TISSUE GRAFT;  Surgeon: Newt Minion, MD;  Location: [facility];  Service: Orthopedics;  Laterality: Right;   AMPUTATION Right 11/16/2021   Procedure: RIGHT AMPUTATION BELOW KNEE WITH WOUND VAC APPLICATION;  Surgeon: Newt Minion, MD;  Location: [facility];  Service: Orthopedics;  Laterality: Right;   BONE BIOPSY Right 03/06/2021   Procedure: BONE BIOPSY;  Surgeon: Trula Slade, DPM;  Location: [facility];  Service:  Podiatry;  Laterality: Right;   CENTRAL VENOUS CATHETER INSERTION Right 01/20/2020   Procedure: INSERTION CENTRAL LINE ADULT; Tunneled central line;  Surgeon: Virl Cagey, MD;  Location: [facility];  Service: General;  Laterality: Right;   COLONOSCOPY N/A 12/05/2014   JQB:HALPFXTK external and internal hemorrhoid/mild diverticulosis/11 polyps removed   ESOPHAGOGASTRODUODENOSCOPY N/A 12/05/2014   WIO:XBDZ duodenitis   GRAFT APPLICATION Right 32/99/2426   Procedure: GRAFT APPLICATION;  Surgeon: Trula Slade, DPM;  Location: [facility];  Service: Podiatry;  Laterality: Right;   INCISION AND DRAINAGE OF WOUND Right 08/15/2021   Procedure: IRRIGATION AND DEBRIDEMENT WOUND;  Surgeon: Landis Martins, DPM;  Location: [facility];  Service: Podiatry;  Laterality: Right;  need to make card not  a irrigation and debridement card   IR FLUORO GUIDE CV LINE RIGHT  03/01/2019   IR PERC TUN PERIT CATH WO PORT S&I /IMAG  06/27/2021   IR REMOVAL TUN CV CATH W/O FL  05/10/2019   IR REMOVAL TUN CV CATH W/O FL  02/29/2020   IR REMOVAL TUN CV CATH W/O FL  07/19/2021   IR US GUIDE VASC ACCESS RIGHT  03/01/2019   IR US GUIDE VASC ACCESS RIGHT  06/27/2021   IRRIGATION AND DEBRIDEMENT ELBOW Left 06/24/2021   Procedure: IRRIGATION AND DEBRIDEMENT ELBOW;  Surgeon:  Lovell Sheehan, MD;  Location: [facility];  Service: Orthopedics;  Laterality: Left;   KIDNEY TRANSPLANT  03/27/2015   Penile Pump Insertion     PERIPHERAL VASCULAR ATHERECTOMY  09/11/2021   Procedure: PERIPHERAL VASCULAR ATHERECTOMY;  Surgeon: Wellington Hampshire, MD;  Location: [facility];  Service: Cardiovascular;;  Shockwave Lithotripsy   TEE WITHOUT CARDIOVERSION N/A 01/17/2020   Procedure: TRANSESOPHAGEAL ECHOCARDIOGRAM (TEE) WITH PROPOFOL;  Surgeon: Arnoldo Lenis, MD;  [facility];  Service: Endoscopy;  Laterality: N/A;   WOUND DEBRIDEMENT Right 03/06/2021   Procedure: DEBRIDEMENT WOUND;  Surgeon: Trula Slade, DPM;  [facility];  Service: Podiatry;  Laterality: Right;   Patient Active Problem List   Diagnosis Date Noted   Cutaneous abscess of right foot    Hyperkalemia 08/28/2021   Obesity (BMI 30-39.9) 08/28/2021   History of anemia due to chronic kidney disease 08/28/2021   Diabetic foot ulcer ([place]) 08/14/2021   Hypomagnesemia 06/25/2021   Septic arthritis of elbow, left ([place]) 06/24/2021   Peripheral vascular disease ([place]) 04/24/2020   Pyogenic inflammation of bone ([facility])    Thrombophlebitis of superficial veins of right lower extremity    Difficult intravenous access    MRSA bacteremia    Diabetic foot infection ([place])    Infected wound 01/12/2020   Critical lower limb ischemia ([place]) 02/02/2019   Personal history of noncompliance with medical treatment, presenting hazards to health 10/07/2018   Critical limb ischemia with history of revascularization of same extremity ([place]) 07/27/2018   Mixed hyperlipidemia 02/19/2018   History of renal transplant 02/19/2018   Conductive hearing loss, bilateral 08/31/2017   Nuclear sclerotic cataract of right eye 10/07/2016   Pseudophakia of left eye 10/07/2016   Hypertension due to endocrine disorder 03/04/2016   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 ([place]) 03/04/2016   Renal failure 03/04/2016   Proliferative diabetic retinopathy with macular edema associated with type 2 diabetes mellitus ([place]) 03/04/2016   Immunosuppression ([place]) 12/10/2015   Nausea vomiting and diarrhea 12/10/2015   Type 2 diabetes mellitus ([place]) 12/10/2015   Essential hypertension, benign 12/10/2015   Pancytopenia ([place]) 12/10/2015   Encounter for screening colonoscopy 11/15/2014   GERD (gastroesophageal reflux disease) 11/15/2014    REFERRING DIAG: T97.741S (ICD-10-CM) - Below-knee amputation of right lower extremit   ONSET DATE: 04/10/2022 prosthesis delivery   THERAPY DIAG:  Unsteadiness on feet  Other  abnormalities of gait and mobility  Muscle weakness (generalized)  Wound of right lower extremity, initial encounter  Rationale for Evaluation and Treatment Rehabilitation  PERTINENT HISTORY: DM2, PVD, ESRD s/p renal transplant 03/09/2015 on immunosuppressive meds, HTN   PRECAUTIONS: None  SUBJECTIVE:  SUBJECTIVE STATEMENT: Kidney doctor says no salt, no fast food.  He agrees that he needs to weigh each morning as PT advised.       PAIN:  Are you having pain? Residual limb pain 0/10 soreness from wearing prosthesis without ability to seat limb into socket.   OBJECTIVE: (objective measures completed at initial evaluation unless otherwise dated) POSTURE: rounded shoulders, forward head, flexed trunk , and weight shift left   LOWER EXTREMITY ROM:   ROM P:passive  A:active Right eval Left eval  Hip flexion      Hip extension      Hip abduction      Hip adduction      Hip internal rotation      Hip external rotation      Knee flexion 100*    Knee extension -5*    Ankle dorsiflexion      Ankle plantarflexion      Ankle inversion      Ankle eversion       (Blank rows = not tested)   LOWER EXTREMITY MMT:   MMT Right eval Left eval  Hip flexion 3+/5    Hip extension 3-/5    Hip abduction 3-/5    Hip adduction      Hip internal rotation      Hip external rotation      Knee flexion 3-/5    Knee extension 3/5    Ankle dorsiflexion      Ankle plantarflexion      Ankle inversion      Ankle eversion      (Blank rows = not tested)   TRANSFERS: Sit to stand: 18" chair with armrests to RW with supervision.  Stand to sit: RW to 18" chair with armrests with supervision   PROSTHETIC GAIT WITH BKA PROSTHESIS: Gait pattern: step to pattern, decreased arm swing- Right, decreased step length- Right,  decreased stance time- Left, decreased stride length, decreased hip/knee flexion- Right, circumduction- Right, Right hip hike, knee flexed in stance- Right, antalgic, lateral hip instability, trunk flexed, and abducted- Right Distance walked: 21' with RW & 20' with cane Assistive device utilized: Single point cane, Walker - 2 wheeled, and BKA prosthesis Level of assistance: SBA with RW and Min A Gait velocity: 0.89 ft/sec Comments: excessive weight bearing on RW, partial weight on prosthesis   FUNCTIONAL TESTs:  05/12/2022:  Merrilee Jansky Balance test 42/56 TUG with prosthesis only: std 9.96sec & cog 10.94sec      04/15/2022: Timed up and go (TUG): 39.25sec with cane & BKA prosthesis Berg Balance Scale: 24/56     CURRENT PROSTHETIC WEAR ASSESSMENT: Patient is dependent with: skin check, residual limb care, care of non-amputated limb, prosthetic cleaning, ply sock cleaning, correct ply sock adjustment, proper wear schedule/adjustment, and proper weight-bearing schedule/adjustment Donning prosthesis: Min A Doffing prosthesis: SBA Prosthetic wear tolerance: 3 hours, 1-2x/day, 5 of 5 days since delivery Prosthetic weight bearing tolerance: 5 minutes Edema: pitting. Residual limb condition: 52m scab wound on medial incision, dry skin, normal color & temperature, cylindrical shape Prosthetic description: silicon liner with pin lock suspension, total contact socket, hydraulic ankle / K3 foot K code/activity level with prosthetic use: Level 3       TODAY'S TREATMENT:  DATE:  05/21/2022: Prosthetic Training with TTA prosthesis: In //bars with BUE support: working on 6" curb using prosthesis / RLE (step up RLE & down LLE for eccentric RLE) and on incline board stablized to simulate ramp with prosthesis on incline in stance. 15 reps. Gait working on increasing pace by taking  longer & quicker steps to "cross street" with supervision. Gait working simulating in dark by 3 steps eyes open & 3 steps eyes closed. Stepping over 4" obstacle 5 reps. PT demo & verbal cues on technique.  Neuromuscular Re-ed: Tandem stance on foam beam 30 sec RLE in front & in back with intermittent touch. Crossways stance hip width on foam beam with back of RUE on //bar to facilitate weight on RLE & some balance assist & mirror for visual input: head turns right/left, up/down & diagonals.  Tactile cues from PT.  And static stance eyes closed 10 sec. Sit to stand 10 reps from 18" chair without UE assist with cues midline using BLEs 10 reps Rocker board - square with 2 pivot points - in //bars with light BUE support: forward/level/back & right/level/left 1 min each.   05/15/2022: Self-care: Pt has excessive fluid in BLEs.   PT educated on weighing in mornings with prosthesis and keeping log to share with MD. PT demo stepping on/off scale. Pt return demo using wall for balance.  Also PT recommended taking BP and logging so MD an monitor.  PT measured LLE for compression stocking which MD apparently recommended.  Size L with Vive Wear.   PT demo 2x & verbal cues on donning compression stocking. PT recommended donning immediately upon arising to limit fluid setting into LE with dependency. Pt & wife verbalized understanding.  Prosthetic Training with TTA prosthesis: Pt's residual limb was so edematous this morning that >2" out of socket. He reports that he did not wear shrinker last night as got in late.  PT recommended shrinker wear at all times out of prosthesis. Pt verbalized understanding.  PT recommended wearing liner when unable to seat limb into socket during awake hours. Then he can quickly donne prosthesis to move around house. Pt verbalized understanding.  Pt ambulated 100' X 2 without device with verbal cues on step width & upright posture.    05/12/2022: Prosthetic Training with  TTA prosthesis: PT recommending wear 4hrs 3x/day with off time 2 hours as limb is still tender. Pt verbalized understanding.  PT recommended using cane for gait beyond short distances around house as only minimal antalgic pattern. Pt verbalized understanding. Pt amb 200' with cane including neg ramp & curb with general cues for deviations no LOB.  Therapeutic Activities: PT demo & verbal cues on floor transfer via half kneeling pushing on chair bottom. Pt performed with ea LE forward 1 rep. Lifting 20# kettle bell 5 reps with PT cues on technique.   Neuromuscular Re-ed for balance: Alternate tapping feet into lower cabinet 1 min. Working on [employer] tennis ball forward/backward, medial/lateral & circles CW / CCW 10 reps ea LE ea motion light LUE support.    HOME EXERCISE PROGRAM: Access Code: [facility] URL: [email or web address] Date: 05/07/2022 Prepared by: Jamey Reas  Exercises - Hip Flexor Stretch at [facility] of Bed  - 1-3 x daily - 7 x weekly - 1 sets - 3 reps - 20-30 seconds hold - Supine Quadriceps Stretch with Strap on Table  - 1-3 x daily - 7 x weekly - 1 sets - 3 reps - 20-30 seconds hold - Supine Bridge  -  1 x daily - 7 x weekly - 2-3 sets - 10 reps - 5 seconds hold - Marching Bridge  - 1 x daily - 7 x weekly - 2-3 sets - 10 reps - 5 seconds hold - Standing Terminal Knee Extension with Resistance  - 1 x daily - 7 x weekly - 2-3 sets - 10 reps - 5 seconds hold - Standing Hip Flexion with Resistance (Mirrored)  - 1 x daily - 7 x weekly - 1 sets - 10 reps - Standing Hip Adduction with Resistance (Mirrored)  - 1 x daily - 7 x weekly - 1 sets - 10 reps - Standing Hip Extension with Resistance  - 1 x daily - 7 x weekly - 1 sets - 10 reps - Standing Hip Abduction with Theraband Resistance  - 1 x daily - 7 x weekly - 1 sets - 10 reps   Do each exercise 1-2  times per day Do each exercise 5-10 repetitions Hold each exercise for 2 seconds to feel your location  AT  [place].  Try to find this position when standing still for activities.   USE TAPE ON FLOOR TO MARK THE MIDLINE POSITION which is even with middle of sink.  You also should try to feel with your limb pressure in socket.  You are trying to feel with limb what you used to feel with the bottom of your foot.  Side to Side Shift: Moving your hips only (not shoulders): move weight onto your left leg, HOLD/FEEL pressure in socket.  Move back to equal weight on each leg, HOLD/FEEL pressure in socket. Move weight onto your right leg, HOLD/FEEL pressure in socket. Move back to equal weight on each leg, HOLD/FEEL pressure in socket. Repeat.  Start with both hands on sink, progress to hand on prosthetic side only, then no hands.  Front to Back Shift: Moving your hips only (not shoulders): move your weight forward onto your toes, HOLD/FEEL pressure in socket. Move your weight back to equal Flat Foot on both legs, HOLD/FEEL  pressure in socket. Move your weight back onto your heels, HOLD/FEEL  pressure in socket. Move your weight back to equal on both legs, HOLD/FEEL  pressure in socket. Repeat.  Start with both hands on sink, progress to hand on prosthetic side only, then no hands.  Moving Cones / Cups: With equal weight on each leg: Hold on with one hand the first time, then progress to no hand supports. Move cups from one side of sink to the other. Place cups ~2" out of your reach, progress to 10" beyond reach.  Place one hand in middle of sink and reach with other hand. Do both arms.  Then hover one hand and move cups with other hand.  Overhead/Upward Reaching: alternated reaching up to top cabinets or ceiling if no cabinets present. Keep equal weight on each leg. Start with one hand support on counter while other hand reaches and progress to no hand support with reaching.  ace one hand in middle of sink and reach with other hand. Do both arms.   Then hover one hand and move cups with other hand.  5.   Looking Over Shoulders: With equal weight on each leg: alternate turning to look over your shoulders with one hand support on counter as needed.  Start with head motions only to look in front of shoulder, then even with shoulder and progress to looking  behind you. To look to side, move head /eyes, then shoulder on side looking pulls back, shift more weight to side looking and pull hip back. Place one hand in middle of sink and let go with other hand so your shoulder can pull back. Switch hands to look other way.   Then hover one hand and look over shoulder. If looking right, use left hand at sink. If looking left, use right hand at sink. 6.  Stepping with leg that is not amputated:  Move items under cabinet out of your way. Shift your hips/pelvis so weight on prosthesis. Tighten muscles in hip on prosthetic side.  SLOWLY step other leg so front of foot is in cabinet. Then step back to floor.      ASSESSMENT:   CLINICAL IMPRESSION: Pt has less issues with edema today enabling limb to be seated into socket.  Pt was challenged by balance activities which improved with instruction & repetition.  OBJECTIVE IMPAIRMENTS: Abnormal gait, decreased activity tolerance, decreased balance, decreased endurance, decreased knowledge of condition, decreased knowledge of use of DME, decreased mobility, decreased ROM, decreased strength, increased edema, impaired flexibility, postural dysfunction, prosthetic dependency , obesity, and wound on residual limb .    ACTIVITY LIMITATIONS: carrying, lifting, bending, standing, squatting, stairs, transfers, locomotion level, and prosthetic use   PARTICIPATION LIMITATIONS: meal prep, cleaning, laundry, driving, community activity, occupation, and yard work   PERSONAL FACTORS: Fitness, Time since onset of injury/illness/exacerbation, and 3+ comorbidities: see PMH  are also affecting patient's functional outcome.    REHAB  POTENTIAL: Good   CLINICAL DECISION MAKING: Evolving/moderate complexity   EVALUATION COMPLEXITY: Moderate     GOALS: Goals reviewed with patient? Yes   SHORT TERM GOALS: Target date: 06/20/2022   Patient verbalizes how to adjust ply socks. Baseline: SEE OBJECTIVE DATA Goal status: SET 05/21/2022 2.  Patient tolerates prosthesis >90% awake hrs /day without skin issues or limb pain after standing. Baseline: SEE OBJECTIVE DATA Goal status: SET 05/21/2022   3. Berg Balance >45/56 Baseline: SEE OBJECTIVE DATA Goal status:  SET 05/21/2022   4. Patient ambulates 400' with prosthesis with supervision. Baseline: SEE OBJECTIVE DATA Goal status: SET 05/21/2022   5. Patient negotiates ramps & curbs with prosthesis with supervision Baseline: SEE OBJECTIVE DATA Goal status: SET 05/21/2022   LONG TERM GOALS: Target date: 07/31/2021   Patient demonstrates & verbalized understanding of prosthetic care to enable safe utilization of prosthesis. Baseline: SEE OBJECTIVE DATA Goal status: INITIAL   Patient tolerates prosthesis wear >90% of awake hours without skin or limb pain issues. Baseline: SEE OBJECTIVE DATA Goal status: INITIAL   Functional Gait Assessment >22/30 to indicate lower fall risk Baseline: SEE OBJECTIVE DATA Goal status: INITIAL   Patient ambulates >500' with prosthesis only independently Baseline: SEE OBJECTIVE DATA Goal status: INITIAL   Patient negotiates ramps, curbs & stairs with single rail with prosthesis only independently. Baseline: SEE OBJECTIVE DATA Goal status: INITIAL   Patient demonstrates & verbalizes lifting, carrying, pushing, pulling with prosthesis only safely. Baseline: SEE OBJECTIVE DATA Goal status: INITIAL     PLAN:   PT FREQUENCY: 3x/week for 5 weeks, then 1-2 x/wk for 10 weeks   PT DURATION: other: 15 weeks   PLANNED INTERVENTIONS: Therapeutic exercises, Therapeutic activity, Neuromuscular re-education, Balance training, Gait training,  Patient/Family education, Self Care, Stair training, Vestibular training, Prosthetic training, DME instructions, and physical performance testing   PLAN FOR NEXT SESSION:   prosthetic gait with & without cane, balance activities   Shirlean Mylar  Jone Baseman, PT, DPT 05/21/2022, 12:30 PM

## 2022-05-22 ENCOUNTER — Encounter: Payer: Self-pay | Admitting: Endocrinology

## 2022-05-22 ENCOUNTER — Ambulatory Visit (INDEPENDENT_AMBULATORY_CARE_PROVIDER_SITE_OTHER): Payer: 59 | Admitting: Endocrinology

## 2022-05-22 ENCOUNTER — Ambulatory Visit (INDEPENDENT_AMBULATORY_CARE_PROVIDER_SITE_OTHER): Payer: 59 | Admitting: Physical Therapy

## 2022-05-22 ENCOUNTER — Encounter: Payer: Self-pay | Admitting: Physical Therapy

## 2022-05-22 VITALS — BP 124/82 | HR 82 | Ht 67.0 in | Wt 241.0 lb

## 2022-05-22 DIAGNOSIS — R2689 Other abnormalities of gait and mobility: Secondary | ICD-10-CM

## 2022-05-22 DIAGNOSIS — M6281 Muscle weakness (generalized): Secondary | ICD-10-CM | POA: Diagnosis not present

## 2022-05-22 DIAGNOSIS — Z794 Long term (current) use of insulin: Secondary | ICD-10-CM

## 2022-05-22 DIAGNOSIS — E1165 Type 2 diabetes mellitus with hyperglycemia: Secondary | ICD-10-CM | POA: Diagnosis not present

## 2022-05-22 DIAGNOSIS — R2681 Unsteadiness on feet: Secondary | ICD-10-CM | POA: Diagnosis not present

## 2022-05-22 MED ORDER — NOVOLOG FLEXPEN 100 UNIT/ML ~~LOC~~ SOPN: PEN_INJECTOR | SUBCUTANEOUS | 1 refills | Status: DC

## 2022-05-22 NOTE — Progress Notes (Signed)
Patient ID: Raymond [name], male   DOB: 1960-07-30, 61 y.o.   MRN: [id number]           Reason for Appointment: Type II Diabetes follow-up   History of Present Illness   Diagnosis date: 2002  Previous history:  Non-insulin hypoglycemic drugs previously used: Trulicity, glipizide He was started on Trulicity in 97/98 Insulin was started in 2018  A1c range in the last few years is: 10.4-14.8  Recent history:     Non-insulin hypoglycemic drugs: Trulicity 3 mg weekly     Insulin regimen: NPH 30 units bid once day usually, Novolin R only taken when blood sugars are over 200, up to 30 units    Side effects from medications: None  Current self management, blood sugar patterns and problems identified:  A1c is 9.2 % and persistently high He has been using the G7 Dexcom sensor but it is still not linked to the practice account and only data from September is available Blood sugar patterns for the last 24 hours was reviewed on his Dexcom app on the phone Despite reminding him to take his mealtime insulin before eating he is only taking it when the blood sugar goes up With this he likely has consistently high postprandial readings and last night blood sugar was over 300 after dinner  He is arbitrarily taking up to 30 units of Novolin regular insulin when the blood sugar goes up at this morning blood sugar was in the 80s  However he still has periodic episodes of low sugars during the night Fasting lab glucose was 99 He has however been able to increase the dose of Trulicity and has no nausea with 3 mg weekly Not clear if he is following directions for meal planning as given by the diabetes educator about 2 months ago His weight has gone up He has however started taking his NPH twice a day instead of just at bedtime and this may be improving his control during the day  Exercise: none   Dietician visit: Most recent: 02/2022   Weight control:  Wt Readings from Last 3  Encounters:  05/22/22 241 lb (109.3 kg)  04/29/22 238 lb (108 kg)  02/25/22 226 lb (102.5 kg)            Diabetes labs:  Lab Results  Component Value Date   HGBA1C 9.2 (H) 05/13/2022   HGBA1C 8.0 (H) 02/05/2022   HGBA1C 11.7 (H) 11/17/2021   Lab Results  Component Value Date   MICROALBUR 31.1 (H) 01/17/2022   LDLCALC 46 08/23/2021   CREATININE 1.41 05/13/2022    Lab Results  Component Value Date   FRUCTOSAMINE 327 (H) 01/14/2022     Allergies as of 05/22/2022       Reactions   Doxycycline    Runs up pt's blood sugar and phosphorus   Bactrim [sulfamethoxazole-trimethoprim]    Runs pt's sugar and phosphorus   Flagyl [metronidazole] Rash   Patient having hives to either the vancomycin or flagyl (both infusing together).   Vancomycin Rash   Patient having hives to either vancomycin or flagyl        Medication List        Accurate as of May 22, 2022 12:51 PM. If you have any questions, ask your nurse or doctor.          Accu-Chek Guide Me w/Device Kit 1 Piece by Does not apply route as directed.   aspirin EC 81 MG tablet Take 1 tablet (81 mg  total) by mouth daily. Swallow whole.   atorvastatin 10 MG tablet Commonly known as: LIPITOR Take 1 tablet (10 mg total) by mouth daily.   blood glucose meter kit and supplies Kit Dispense based on patient and insurance preference. Use up to four times daily as directed. (FOR ICD-9 250.00, 250.01).   brimonidine 0.2 % ophthalmic solution Commonly known as: ALPHAGAN Place 1 drop into both eyes 3 (three) times daily.   cloNIDine 0.3 mg/24hr patch Commonly known as: CATAPRES - Dosed in mg/24 hr Place 1 patch (0.3 mg total) onto the skin once a week. What changed: additional instructions   clopidogrel 75 MG tablet Commonly known as: Plavix Take 1 tablet (75 mg total) by mouth daily.   Dexcom G7 Receiver Devi Use to check blood sugar 3 times a day. Dx code e11.65   Dexcom G7 Sensor Misc Use to check  blood sugar 3 times a day. Dx code e11.65   docusate sodium 100 MG capsule Commonly known as: COLACE Take 1 capsule (100 mg total) by mouth daily.   dorzolamide-timolol 2-0.5 % ophthalmic solution Commonly known as: COSOPT Place 1 drop into both eyes 2 (two) times daily.   doxazosin 4 MG tablet Commonly known as: CARDURA Take 4 mg by mouth daily.   furosemide 20 MG tablet Commonly known as: LASIX 20 mg 2 (two) times daily.   gabapentin 300 MG capsule Commonly known as: NEURONTIN Take 3 capsules (900 mg total) by mouth 3 (three) times daily. 3 times a day   glucose blood test strip Commonly known as: Accu-Chek Guide Use as instructed 4 x daily. E11.65 What changed:  when to take this additional instructions   hydrALAZINE 25 MG tablet Commonly known as: APRESOLINE Take 1 tablet (25 mg total) by mouth 3 (three) times daily.   latanoprost 0.005 % ophthalmic solution Commonly known as: XALATAN Place 1 drop into both eyes at bedtime.   Lokelma 10 g Pack packet Generic drug: sodium zirconium cyclosilicate Take 10 g by mouth daily.   multivitamin capsule Take 1 capsule by mouth daily.   mupirocin ointment 2 % Commonly known as: BACTROBAN Apply 1 application. topically 2 (two) times daily. For wound care   NovoLIN N FlexPen 100 UNIT/ML Kiwkpen Generic drug: Insulin NPH (Human) ([place]) INJECT 30 UNITS INTO THE SKIN IN THE MORNING AND 45 UNITS AT BEDTIME   NovoLIN R FlexPen ReliOn 100 UNIT/ML KwikPen Generic drug: Insulin Regular Human INJECT 1-9 UNITS SUBCUTANEOUSLY AS DIRECTED THREE TIMES DAILY BEFORE MEALS USING INCLUDED SLIDING SCALE   NovoLOG FlexPen 100 UNIT/ML FlexPen Generic drug: insulin aspart 25 units before meals Started by: Elayne Snare, MD   ondansetron 4 MG tablet Commonly known as: ZOFRAN Take 1 tablet (4 mg total) by mouth every 6 (six) hours as needed for nausea.   oxyCODONE 5 MG immediate release tablet Commonly known as: Oxy  IR/ROXICODONE Take 1 tablet (5 mg total) by mouth every 4 (four) hours as needed for severe pain (pain score 4-6).   oxyCODONE-acetaminophen 5-325 MG tablet Commonly known as: PERCOCET/ROXICET Take 1 tablet by mouth every 4 (four) hours as needed.   sodium bicarbonate 650 MG tablet Take 1,300 mg by mouth 3 (three) times daily.   tacrolimus 1 MG capsule Commonly known as: PROGRAF Take 4 capsules (4 mg total) by mouth 2 (two) times daily.   Trulicity 3 ZY/6.0YT Sopn Generic drug: Dulaglutide Inject 3 mg as directed once a week. What changed: Another medication with the same name was removed.  Continue taking this medication, and follow the directions you see here. Changed by: Elayne Snare, MD   Vitamin D (Ergocalciferol) 1.25 MG (50000 UNIT) Caps capsule Commonly known as: DRISDOL Take 50,000 Units by mouth See admin instructions. 15th of the month        Allergies:  Allergies  Allergen Reactions   Doxycycline     Runs up pt's blood sugar and phosphorus   Bactrim [Sulfamethoxazole-Trimethoprim]     Runs pt's sugar and phosphorus   Flagyl [Metronidazole] Rash    Patient having hives to either the vancomycin or flagyl (both infusing together).   Vancomycin Rash    Patient having hives to either vancomycin or flagyl    Past Medical History:  Diagnosis Date   Diabetes ([place])    Diabetes mellitus without complication ([place])    type 2   ESRD (end stage renal disease) ([place])    03/09/2015- patient had a kidney transplant   Hypertension    Peripheral vascular disease ([place])    Renal disorder    Renal insufficiency     Past Surgical History:  Procedure Laterality Date   ABDOMINAL AORTOGRAM W/LOWER EXTREMITY N/A 09/11/2021   Procedure: ABDOMINAL AORTOGRAM W/LOWER EXTREMITY;  Surgeon: Wellington Hampshire, MD;  Location: [facility];  Service: Cardiovascular;  Laterality: N/A;   AMPUTATION Right 09/25/2021   Procedure: RIGHT TRANSMETATARSAL AMPUTATION AND APPLY TISSUE GRAFT;   Surgeon: Newt Minion, MD;  Location: [facility];  Service: Orthopedics;  Laterality: Right;   AMPUTATION Right 11/16/2021   Procedure: RIGHT AMPUTATION BELOW KNEE WITH WOUND VAC APPLICATION;  Surgeon: Newt Minion, MD;  Location: [facility];  Service: Orthopedics;  Laterality: Right;   BONE BIOPSY Right 03/06/2021   Procedure: BONE BIOPSY;  Surgeon: Trula Slade, DPM;  Location: [facility];  Service: Podiatry;  Laterality: Right;   CENTRAL VENOUS CATHETER INSERTION Right 01/20/2020   Procedure: INSERTION CENTRAL LINE ADULT; Tunneled central line;  Surgeon: Virl Cagey, MD;  Location: [facility];  Service: General;  Laterality: Right;   COLONOSCOPY N/A 12/05/2014   SHF:WYOVZCHY external and internal hemorrhoid/mild diverticulosis/11 polyps removed   ESOPHAGOGASTRODUODENOSCOPY N/A 12/05/2014   IFO:YDXA duodenitis   GRAFT APPLICATION Right 12/87/8676   Procedure: GRAFT APPLICATION;  Surgeon: Trula Slade, DPM;  Location: [facility];  Service: Podiatry;  Laterality: Right;   INCISION AND DRAINAGE OF WOUND Right 08/15/2021   Procedure: IRRIGATION AND DEBRIDEMENT WOUND;  Surgeon: Landis Martins, DPM;  Location: [facility];  Service: Podiatry;  Laterality: Right;  need to make card not  a irrigation and debridement card   IR FLUORO GUIDE CV LINE RIGHT  03/01/2019   IR PERC TUN PERIT CATH WO PORT S&I /IMAG  06/27/2021   IR REMOVAL TUN CV CATH W/O FL  05/10/2019   IR REMOVAL TUN CV CATH W/O FL  02/29/2020   IR REMOVAL TUN CV CATH W/O FL  07/19/2021   IR US GUIDE VASC ACCESS RIGHT  03/01/2019   IR US GUIDE VASC ACCESS RIGHT  06/27/2021   IRRIGATION AND DEBRIDEMENT ELBOW Left 06/24/2021   Procedure: IRRIGATION AND DEBRIDEMENT ELBOW;  Surgeon: Lovell Sheehan, MD;  Location: [facility];  Service: Orthopedics;  Laterality: Left;   KIDNEY TRANSPLANT  03/27/2015   Penile Pump Insertion     PERIPHERAL VASCULAR ATHERECTOMY  09/11/2021   Procedure: PERIPHERAL VASCULAR ATHERECTOMY;  Surgeon: Wellington Hampshire, MD;   Location: [facility];  Service: Cardiovascular;;  Shockwave Lithotripsy   TEE WITHOUT  CARDIOVERSION N/A 01/17/2020   Procedure: TRANSESOPHAGEAL ECHOCARDIOGRAM (TEE) WITH PROPOFOL;  Surgeon: Arnoldo Lenis, MD;  [facility];  Service: Endoscopy;  Laterality: N/A;   WOUND DEBRIDEMENT Right 03/06/2021   Procedure: DEBRIDEMENT WOUND;  Surgeon: Trula Slade, DPM;  Location: [facility];  Service: Podiatry;  Laterality: Right;    Family History  Problem Relation Age of Onset   Diabetes Mother    Heart disease Father    Colon cancer Neg Hx     Social History:  reports that he has never smoked. He has never used smokeless tobacco. He reports that he does not drink alcohol and does not use drugs.  Review of Systems:  Last diabetic eye exam date?  Last urine microalbumin date: 8/23  Last foot exam date: 10/22  Symptoms of neuropathy:  Hypertension: Managed by nephrology, not on [facility] drug  BP Readings from Last 3 Encounters:  05/22/22 124/82  04/29/22 138/82  02/25/22 (!) 165/80    Lipid management: LDL appears to be controlled with 10 mg atorvastatin    Lab Results  Component Value Date   CHOL 104 08/23/2021   CHOL 148 12/11/2020   CHOL 183 12/12/2019   Lab Results  Component Value Date   HDL 38 (L) 08/23/2021   HDL 39 (L) 12/11/2020   HDL 41 12/12/2019   Lab Results  Component Value Date   LDLCALC 46 08/23/2021   LDLCALC 87 12/11/2020   LDLCALC 125 (H) 12/12/2019   Lab Results  Component Value Date   TRIG 111 08/23/2021   TRIG 121 12/11/2020   TRIG 94 12/12/2019   Lab Results  Component Value Date   CHOLHDL 2.7 08/23/2021   CHOLHDL 3.8 12/11/2020   CHOLHDL 4.5 12/12/2019   No results found for: "LDLDIRECT"   Fibrosis 4 Score = .69 (Low risk)        Interpretation for patients with NAFLD          <1.30       -  F0-F1 (Low risk)          1.30-2.67 -  Indeterminate           >2.67      -  F3-F4 (High risk)     Validated for ages  54-65      Examination:   BP 124/82   Pulse 82   Ht _0  (1.702 m)   Wt 241 lb (109.3 kg)   SpO2 97%   BMI 37.75 kg/m   Body mass index is 37.75 kg/m.    ASSESSMENT/ PLAN:    Diabetes type 2 insulin-dependent:   Current regimen: NPH twice daily, regular insulin as needed  See history of present illness for detailed discussion of current diabetes management, blood sugar patterns and problems identified  A1c is consistently high although relatively better now at 9.3  Fructosamine is 327 last  Unable to review his Dexcom data today as he does not have the updated clarity app  Not clear if he is benefiting much from Trulicity with still high postprandial readings and some weight gain Despite discussion in the last visit he still has poor understanding about how to use his mealtime insulin and is taking it only when the blood sugar goes up again  Recommendations:  He will try NovoLog if it is covered instead of regular insulin Today discussed in detail the details about mealtime insulin to proactively cover postprandial spikes, action of mealtime insulin, timing and action of the rapid  acting insulin as well as dosage titration to target the two-hour reading of under 180 He is likely needs about 20-25 units mealtime coverage based on his meal size For now we will continue his Trulicity and NPH unchanged He will not take any correction doses of NovoLog at bedtime  Follow-up to be scheduled for the diabetes educator to help with understanding and adjust his insulin better and correlate with diet  There are no Patient Instructions on file for this visit.  Total visit time including counseling = 30 minutes  Elayne Snare 05/22/2022, 12:51 PM

## 2022-05-22 NOTE — Telephone Encounter (Signed)
Per Dawn with [employer] " pt already has a wc set up on 6/23 and had it swapped out 05/20/22, thanks! Please cancel!"

## 2022-05-22 NOTE — Therapy (Signed)
OUTPATIENT PHYSICAL THERAPY TREATMENT NOTE   Patient Name: Raymond [name] MRN: [id number] DOB:February 06, 1961, 61 y.o., male Today's Date: 05/22/2022  PCP: [name], FNP  REFERRING PROVIDER: Newt Minion, MD       END OF SESSION:   PT End of Session - 05/22/22 0845     Visit Number 13    Number of Visits 25    Date for PT Re-Evaluation 08/01/22    Authorization Type AET[employer]    Authorization Time Period NO COPAY, 35 PT/OT VISITS Calendar Year Benefit Date 05/26/2021 - 05/25/2022    Authorization - Visit Number 32    Authorization - Number of Visits 35    Progress Note Due on Visit 4    PT Start Time 0845    PT Stop Time 0928    PT Time Calculation (min) 43 min    Equipment Utilized During Treatment Gait belt    Activity Tolerance Patient tolerated treatment well    Behavior During Therapy WFL for tasks assessed/performed                     Past Medical History:  Diagnosis Date   Diabetes ([place])    Diabetes mellitus without complication ([place])    type 2   ESRD (end stage renal disease) ([place])    03/09/2015- patient had a kidney transplant   Hypertension    Peripheral vascular disease ([place])    Renal disorder    Renal insufficiency    Past Surgical History:  Procedure Laterality Date   ABDOMINAL AORTOGRAM W/LOWER EXTREMITY N/A 09/11/2021   Procedure: ABDOMINAL AORTOGRAM W/LOWER EXTREMITY;  Surgeon: Wellington Hampshire, MD;  Location: [facility];  Service: Cardiovascular;  Laterality: N/A;   AMPUTATION Right 09/25/2021   Procedure: RIGHT TRANSMETATARSAL AMPUTATION AND APPLY TISSUE GRAFT;  Surgeon: Newt Minion, MD;  Location: [facility];  Service: Orthopedics;  Laterality: Right;   AMPUTATION Right 11/16/2021   Procedure: RIGHT AMPUTATION BELOW KNEE WITH WOUND VAC APPLICATION;  Surgeon: Newt Minion, MD;  Location: [place] [facility];  Service: Orthopedics;  Laterality: Right;   BONE BIOPSY Right 03/06/2021   Procedure: BONE BIOPSY;  Surgeon: Trula Slade, DPM;  Location: [facility];  Service: Podiatry;  Laterality: Right;   CENTRAL VENOUS CATHETER INSERTION Right 01/20/2020   Procedure: INSERTION CENTRAL LINE ADULT; Tunneled central line;  Surgeon: Virl Cagey, MD;  Location: [facility];  Service: General;  Laterality: Right;   COLONOSCOPY N/A 12/05/2014   OIZ:[id number] external and internal hemorrhoid/mild diverticulosis/11 polyps removed   ESOPHAGOGASTRODUODENOSCOPY N/A 12/05/2014   IPJ:ASNK duodenitis   GRAFT APPLICATION Right 53/97/6734   Procedure: GRAFT APPLICATION;  Surgeon: Trula Slade, DPM;  Location: [facility];  Service: Podiatry;  Laterality: Right;   INCISION AND DRAINAGE OF WOUND Right 08/15/2021   Procedure: IRRIGATION AND DEBRIDEMENT WOUND;  Surgeon: Landis Martins, DPM;  Location: [facility];  Service: Podiatry;  Laterality: Right;  need to make card not  a irrigation and debridement card   IR FLUORO GUIDE CV LINE RIGHT  03/01/2019   IR PERC TUN PERIT CATH WO PORT S&I /IMAG  06/27/2021   IR REMOVAL TUN CV CATH W/O FL  05/10/2019   IR REMOVAL TUN CV CATH W/O FL  02/29/2020   IR REMOVAL TUN CV CATH W/O FL  07/19/2021   IR US GUIDE VASC ACCESS RIGHT  03/01/2019   IR US GUIDE VASC ACCESS RIGHT  06/27/2021   IRRIGATION AND DEBRIDEMENT ELBOW Left 06/24/2021  Procedure: IRRIGATION AND DEBRIDEMENT ELBOW;  Surgeon: Lovell Sheehan, MD;  Location: [facility];  Service: Orthopedics;  Laterality: Left;   KIDNEY TRANSPLANT  03/27/2015   Penile Pump Insertion     PERIPHERAL VASCULAR ATHERECTOMY  09/11/2021   Procedure: PERIPHERAL VASCULAR ATHERECTOMY;  Surgeon: Wellington Hampshire, MD;  Location: [facility];  Service: Cardiovascular;;  Shockwave Lithotripsy   TEE WITHOUT CARDIOVERSION N/A 01/17/2020   Procedure: TRANSESOPHAGEAL ECHOCARDIOGRAM (TEE) WITH PROPOFOL;  Surgeon: Arnoldo Lenis, MD;  [facility];  Service: Endoscopy;  Laterality: N/A;   WOUND DEBRIDEMENT Right 03/06/2021   Procedure: DEBRIDEMENT WOUND;  Surgeon:  Trula Slade, DPM;  [facility];  Service: Podiatry;  Laterality: Right;   Patient Active Problem List   Diagnosis Date Noted   Cutaneous abscess of right foot    Hyperkalemia 08/28/2021   Obesity (BMI 30-39.9) 08/28/2021   History of anemia due to chronic kidney disease 08/28/2021   Diabetic foot ulcer ([place]) 08/14/2021   Hypomagnesemia 06/25/2021   Septic arthritis of elbow, left ([place]) 06/24/2021   Peripheral vascular disease ([place]) 04/24/2020   Pyogenic inflammation of bone ([facility])    Thrombophlebitis of superficial veins of right lower extremity    Difficult intravenous access    MRSA bacteremia    Diabetic foot infection ([place])    Infected wound 01/12/2020   Critical lower limb ischemia ([place]) 02/02/2019   Personal history of noncompliance with medical treatment, presenting hazards to health 10/07/2018   Critical limb ischemia with history of revascularization of same extremity ([place]) 07/27/2018   Mixed hyperlipidemia 02/19/2018   History of renal transplant 02/19/2018   Conductive hearing loss, bilateral 08/31/2017   Nuclear sclerotic cataract of right eye 10/07/2016   Pseudophakia of left eye 10/07/2016   Hypertension due to endocrine disorder 03/04/2016   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 ([place]) 03/04/2016   Renal failure 03/04/2016   Proliferative diabetic retinopathy with macular edema associated with type 2 diabetes mellitus ([place]) 03/04/2016   Immunosuppression ([place]) 12/10/2015   Nausea vomiting and diarrhea 12/10/2015   Type 2 diabetes mellitus ([place]) 12/10/2015   Essential hypertension, benign 12/10/2015   Pancytopenia ([place]) 12/10/2015   Encounter for screening colonoscopy 11/15/2014   GERD (gastroesophageal reflux disease) 11/15/2014    REFERRING DIAG: I77.824M (ICD-10-CM) - Below-knee amputation of right lower extremit   ONSET DATE: 04/10/2022 prosthesis delivery   THERAPY DIAG:   Unsteadiness on feet  Other abnormalities of gait and mobility  Muscle weakness (generalized)  Rationale for Evaluation and Treatment Rehabilitation  PERTINENT HISTORY: DM2, PVD, ESRD s/p renal transplant 03/09/2015 on immunosuppressive meds, HTN   PRECAUTIONS: None  SUBJECTIVE:  SUBJECTIVE STATEMENT: He has to get batteries for scale.       PAIN:  Are you having pain? Residual limb pain 0/10 soreness from wearing prosthesis without ability to seat limb into socket.   OBJECTIVE: (objective measures completed at initial evaluation unless otherwise dated) POSTURE: rounded shoulders, forward head, flexed trunk , and weight shift left   LOWER EXTREMITY ROM:   ROM P:passive  A:active Right eval Left eval  Hip flexion      Hip extension      Hip abduction      Hip adduction      Hip internal rotation      Hip external rotation      Knee flexion 100*    Knee extension -5*    Ankle dorsiflexion      Ankle plantarflexion      Ankle inversion      Ankle eversion       (Blank rows = not tested)   LOWER EXTREMITY MMT:   MMT Right eval Left eval  Hip flexion 3+/5    Hip extension 3-/5    Hip abduction 3-/5    Hip adduction      Hip internal rotation      Hip external rotation      Knee flexion 3-/5    Knee extension 3/5    Ankle dorsiflexion      Ankle plantarflexion      Ankle inversion      Ankle eversion      (Blank rows = not tested)   TRANSFERS: Sit to stand: 18" chair with armrests to RW with supervision.  Stand to sit: RW to 18" chair with armrests with supervision   PROSTHETIC GAIT WITH BKA PROSTHESIS: Gait pattern: step to pattern, decreased arm swing- Right, decreased step length- Right, decreased stance time- Left, decreased stride length, decreased hip/knee flexion- Right,  circumduction- Right, Right hip hike, knee flexed in stance- Right, antalgic, lateral hip instability, trunk flexed, and abducted- Right Distance walked: 38' with RW & 20' with cane Assistive device utilized: Single point cane, Walker - 2 wheeled, and BKA prosthesis Level of assistance: SBA with RW and Min A Gait velocity: 0.89 ft/sec Comments: excessive weight bearing on RW, partial weight on prosthesis   FUNCTIONAL TESTs:  05/12/2022:  Merrilee Jansky Balance test 42/56 TUG with prosthesis only: std 9.96sec & cog 10.94sec      04/15/2022: Timed up and go (TUG): 39.25sec with cane & BKA prosthesis Berg Balance Scale: 24/56     CURRENT PROSTHETIC WEAR ASSESSMENT: Patient is dependent with: skin check, residual limb care, care of non-amputated limb, prosthetic cleaning, ply sock cleaning, correct ply sock adjustment, proper wear schedule/adjustment, and proper weight-bearing schedule/adjustment Donning prosthesis: Min A Doffing prosthesis: SBA Prosthetic wear tolerance: 3 hours, 1-2x/day, 5 of 5 days since delivery Prosthetic weight bearing tolerance: 5 minutes Edema: pitting. Residual limb condition: 37m scab wound on medial incision, dry skin, normal color & temperature, cylindrical shape Prosthetic description: silicon liner with pin lock suspension, total contact socket, hydraulic ankle / K3 foot K code/activity level with prosthetic use: Level 3       TODAY'S TREATMENT:  DATE:  05/22/2022: Prosthetic Training with TTA prosthesis: Standing on ramp in step stance with UE support on window seal - stepping RLE forward  & backward 10 reps ea LE facing uphill & downhill.   Neuromuscular Re-ed: Tandem stance on floor at sink: counting to 30 (stop count when touching) RLE in front & in back. Pt verbalized understanding as HEP. Tandem gait along counter with single  support 8' X 2. Pt verbalized understanding as HEP. SLS with one forefoot on lower shelf of cabinet- counting to 10 (stop count when touching) - 2 reps ea LE Pt verbalized understanding as HEP. Rocker board - square with 2 pivot points - in //bars with light BUE support: forward/level/back & right/level/left 1 min each. UE resistance to facilitate equal weight bearing LEs - blue theraband - alternating UEs & BUEs 10 reps ea. Row, forward reach and upward reach. Pt verbalized understanding as HEP.   05/21/2022: Prosthetic Training with TTA prosthesis: In //bars with BUE support: working on 6" curb using prosthesis / RLE (step up RLE & down LLE for eccentric RLE) and on incline board stablized to simulate ramp with prosthesis on incline in stance. 15 reps. Gait working on increasing pace by taking longer & quicker steps to "cross street" with supervision. Gait working simulating in dark by 3 steps eyes open & 3 steps eyes closed. Stepping over 4" obstacle 5 reps. PT demo & verbal cues on technique.  Neuromuscular Re-ed: Tandem stance on foam beam 30 sec RLE in front & in back with intermittent touch. Crossways stance hip width on foam beam with back of RUE on //bar to facilitate weight on RLE & some balance assist & mirror for visual input: head turns right/left, up/down & diagonals.  Tactile cues from PT.  And static stance eyes closed 10 sec. Sit to stand 10 reps from 18" chair without UE assist with cues midline using BLEs 10 reps Rocker board - square with 2 pivot points - in //bars with light BUE support: forward/level/back & right/level/left 1 min each.   05/15/2022: Self-care: Pt has excessive fluid in BLEs.   PT educated on weighing in mornings with prosthesis and keeping log to share with MD. PT demo stepping on/off scale. Pt return demo using wall for balance.  Also PT recommended taking BP and logging so MD an monitor.  PT measured LLE for compression stocking which MD apparently  recommended.  Size L with Vive Wear.   PT demo 2x & verbal cues on donning compression stocking. PT recommended donning immediately upon arising to limit fluid setting into LE with dependency. Pt & wife verbalized understanding.  Prosthetic Training with TTA prosthesis: Pt's residual limb was so edematous this morning that >2" out of socket. He reports that he did not wear shrinker last night as got in late.  PT recommended shrinker wear at all times out of prosthesis. Pt verbalized understanding.  PT recommended wearing liner when unable to seat limb into socket during awake hours. Then he can quickly donne prosthesis to move around house. Pt verbalized understanding.  Pt ambulated 100' X 2 without device with verbal cues on step width & upright posture.     HOME EXERCISE PROGRAM: Access Code: [facility] URL: [email or web address] Date: 05/22/2022 Prepared by: Jamey Reas  Exercises - Hip Flexor Stretch at [facility] of Bed  - 1-3 x daily - 7 x weekly - 1 sets - 3 reps - 20-30 seconds hold - Supine Quadriceps Stretch with Strap on Table  - 1-3  x daily - 7 x weekly - 1 sets - 3 reps - 20-30 seconds hold - Supine Bridge  - 1 x daily - 7 x weekly - 2-3 sets - 10 reps - 5 seconds hold - Marching Bridge  - 1 x daily - 7 x weekly - 2-3 sets - 10 reps - 5 seconds hold - Standing Terminal Knee Extension with Resistance  - 1 x daily - 7 x weekly - 2-3 sets - 10 reps - 5 seconds hold - Standing Hip Flexion with Resistance (Mirrored)  - 1 x daily - 7 x weekly - 1 sets - 10 reps - Standing Hip Adduction with Resistance (Mirrored)  - 1 x daily - 7 x weekly - 1 sets - 10 reps - Standing Hip Extension with Resistance  - 1 x daily - 7 x weekly - 1 sets - 10 reps - Standing Hip Abduction with Theraband Resistance  - 1 x daily - 7 x weekly - 1 sets - 10 reps - Standing Tandem Balance with Counter Support  - 1 x daily - 7 x weekly - 1 sets - 3-4 reps - 30 seconds hold - Tandem Walking with Counter  Support  - 1 x daily - 7 x weekly - 1 sets - 6 reps - single leg stance with one foot in lower cabinet  - 1 x daily - 7 x weekly - 1 sets - 5-10 reps - 10 seconds hold - Carioca with Counter Support  - 1 x daily - 7 x weekly - 1 sets - 10 reps - Alternating Punch with Resistance  - 1 x daily - 5 x weekly - 1 sets - 10 reps - 5 seconds hold - Standing Scapular Protraction with Resistance  - 1 x daily - 7 x weekly - 1 sets - 10 reps - 5 seconds hold - Standing alternate rows with resistance  - 1 x daily - 7 x weekly - 1 sets - 10 reps - 5 seconds hold - Standing Row with Anchored Resistance  - 1 x daily - 7 x weekly - 1 sets - 10 reps - 5 seconds hold - Alternating elbow flexion with resistance  - 1 x daily - 7 x weekly - 1 sets - 10 reps - 5 seconds hold - Standing Bicep Curls with Resistance  - 1 x daily - 7 x weekly - 1 sets - 10 reps - 5 seconds hold   Do each exercise 1-2  times per day Do each exercise 5-10 repetitions Hold each exercise for 2 seconds to feel your location  AT [place].  Try to find this position when standing still for activities.   USE TAPE ON FLOOR TO MARK THE MIDLINE POSITION which is even with middle of sink.  You also should try to feel with your limb pressure in socket.  You are trying to feel with limb what you used to feel with the bottom of your foot.  Side to Side Shift: Moving your hips only (not shoulders): move weight onto your left leg, HOLD/FEEL pressure in socket.  Move back to equal weight on each leg, HOLD/FEEL pressure in socket. Move weight onto your right leg, HOLD/FEEL pressure in socket. Move back to equal weight on each leg, HOLD/FEEL pressure in socket. Repeat.  Start with both hands on sink, progress to hand on prosthetic side only, then no hands.  Front to Back Shift: Moving  your hips only (not shoulders): move your weight forward onto your toes, HOLD/FEEL pressure in socket.  Move your weight back to equal Flat Foot on both legs, HOLD/FEEL  pressure in socket. Move your weight back onto your heels, HOLD/FEEL  pressure in socket. Move your weight back to equal on both legs, HOLD/FEEL  pressure in socket. Repeat.  Start with both hands on sink, progress to hand on prosthetic side only, then no hands.  Moving Cones / Cups: With equal weight on each leg: Hold on with one hand the first time, then progress to no hand supports. Move cups from one side of sink to the other. Place cups ~2" out of your reach, progress to 10" beyond reach.  Place one hand in middle of sink and reach with other hand. Do both arms.  Then hover one hand and move cups with other hand.  Overhead/Upward Reaching: alternated reaching up to top cabinets or ceiling if no cabinets present. Keep equal weight on each leg. Start with one hand support on counter while other hand reaches and progress to no hand support with reaching.  ace one hand in middle of sink and reach with other hand. Do both arms.  Then hover one hand and move cups with other hand.  5.   Looking Over Shoulders: With equal weight on each leg: alternate turning to look over your shoulders with one hand support on counter as needed.  Start with head motions only to look in front of shoulder, then even with shoulder and progress to looking behind you. To look to side, move head /eyes, then shoulder on side looking pulls back, shift more weight to side looking and pull hip back. Place one hand in middle of sink and let go with other hand so your shoulder can pull back. Switch hands to look other way.   Then hover one hand and look over shoulder. If looking right, use left hand at sink. If looking left, use right hand at sink. 6.  Stepping with leg that is not amputated:  Move items under cabinet out of your way. Shift your hips/pelvis so weight on prosthesis. Tighten muscles in hip on prosthetic side.  SLOWLY step other leg so front of foot is in cabinet.  Then step back to floor.      ASSESSMENT:   CLINICAL IMPRESSION: Patient appears to understand updated HEP.  Patient is improving his balance & function with prosthesis.   OBJECTIVE IMPAIRMENTS: Abnormal gait, decreased activity tolerance, decreased balance, decreased endurance, decreased knowledge of condition, decreased knowledge of use of DME, decreased mobility, decreased ROM, decreased strength, increased edema, impaired flexibility, postural dysfunction, prosthetic dependency , obesity, and wound on residual limb .    ACTIVITY LIMITATIONS: carrying, lifting, bending, standing, squatting, stairs, transfers, locomotion level, and prosthetic use   PARTICIPATION LIMITATIONS: meal prep, cleaning, laundry, driving, community activity, occupation, and yard work   PERSONAL FACTORS: Fitness, Time since onset of injury/illness/exacerbation, and 3+ comorbidities: see PMH  are also affecting patient's functional outcome.    REHAB POTENTIAL: Good   CLINICAL DECISION MAKING: Evolving/moderate complexity   EVALUATION COMPLEXITY: Moderate     GOALS: Goals reviewed with patient? Yes   SHORT TERM GOALS: Target date: 06/20/2022   Patient verbalizes how to adjust ply socks. Baseline: SEE OBJECTIVE DATA Goal status: SET 05/21/2022 2.  Patient tolerates prosthesis >90% awake hrs /day without skin issues or limb pain after standing. Baseline: SEE OBJECTIVE DATA Goal status: SET 05/21/2022   3. Merrilee Jansky  Balance >45/56 Baseline: SEE OBJECTIVE DATA Goal status:  SET 05/21/2022   4. Patient ambulates 400' with prosthesis with supervision. Baseline: SEE OBJECTIVE DATA Goal status: SET 05/21/2022   5. Patient negotiates ramps & curbs with prosthesis with supervision Baseline: SEE OBJECTIVE DATA Goal status: SET 05/21/2022   LONG TERM GOALS: Target date: 07/31/2021   Patient demonstrates & verbalized understanding of prosthetic care to enable safe utilization of prosthesis. Baseline: SEE OBJECTIVE  DATA Goal status: INITIAL   Patient tolerates prosthesis wear >90% of awake hours without skin or limb pain issues. Baseline: SEE OBJECTIVE DATA Goal status: INITIAL   Functional Gait Assessment >22/30 to indicate lower fall risk Baseline: SEE OBJECTIVE DATA Goal status: INITIAL   Patient ambulates >500' with prosthesis only independently Baseline: SEE OBJECTIVE DATA Goal status: INITIAL   Patient negotiates ramps, curbs & stairs with single rail with prosthesis only independently. Baseline: SEE OBJECTIVE DATA Goal status: INITIAL   Patient demonstrates & verbalizes lifting, carrying, pushing, pulling with prosthesis only safely. Baseline: SEE OBJECTIVE DATA Goal status: INITIAL     PLAN:   PT FREQUENCY: 3x/week for 5 weeks, then 1-2 x/wk for 10 weeks   PT DURATION: other: 15 weeks   PLANNED INTERVENTIONS: Therapeutic exercises, Therapeutic activity, Neuromuscular re-education, Balance training, Gait training, Patient/Family education, Self Care, Stair training, Vestibular training, Prosthetic training, DME instructions, and physical performance testing   PLAN FOR NEXT SESSION:  verbally check on updated HEP,  prosthetic gait with & without cane, balance activities   Jamey Reas, PT, DPT 05/22/2022, 12:44 PM

## 2022-05-22 NOTE — Telephone Encounter (Signed)
Sending order to [place] [facility]

## 2022-05-22 NOTE — Telephone Encounter (Signed)
Called, LM for pt to notify him about order placed and to answer any calls he might not know, as it can be adapt trying to reach out about wheelchair order.

## 2022-05-22 NOTE — Telephone Encounter (Signed)
Per [place] pt's insurance has already covered a walker on 11/19/21 and will not cover another one at this time. Pt does not wish to private pay at this time and pt asked to cancel order.

## 2022-05-22 NOTE — Telephone Encounter (Signed)
SW pt, it was not for a wheelchair. It is for a rollator walker with a seat. I did put in a new order for this.

## 2022-05-24 ENCOUNTER — Other Ambulatory Visit: Payer: Self-pay | Admitting: Nurse Practitioner

## 2022-05-27 ENCOUNTER — Encounter: Payer: 59 | Admitting: Physical Therapy

## 2022-05-27 NOTE — Therapy (Incomplete)
OUTPATIENT PHYSICAL THERAPY TREATMENT NOTE   Patient Name: Raymond [name] MRN: [id number] DOB:03-12-1961, 62 y.o., male Today's Date: 05/27/2022  PCP: [name], FNP  REFERRING PROVIDER: Newt Minion, MD       END OF SESSION:             Past Medical History:  Diagnosis Date   Diabetes [facility])    Diabetes mellitus without complication ([place])    type 2   ESRD (end stage renal disease) ([place])    03/09/2015- patient had a kidney transplant   Hypertension    Peripheral vascular disease ([place])    Renal disorder    Renal insufficiency    Past Surgical History:  Procedure Laterality Date   ABDOMINAL AORTOGRAM W/LOWER EXTREMITY N/A 09/11/2021   Procedure: ABDOMINAL AORTOGRAM W/LOWER EXTREMITY;  Surgeon: Wellington Hampshire, MD;  Location: [place] [facility];  Service: Cardiovascular;  Laterality: N/A;   AMPUTATION Right 09/25/2021   Procedure: RIGHT TRANSMETATARSAL AMPUTATION AND APPLY TISSUE GRAFT;  Surgeon: Newt Minion, MD;  Location: [place];  Service: Orthopedics;  Laterality: Right;   AMPUTATION Right 11/16/2021   Procedure: RIGHT AMPUTATION BELOW KNEE WITH WOUND VAC APPLICATION;  Surgeon: Newt Minion, MD;  Location: [place];  Service: Orthopedics;  Laterality: Right;   BONE BIOPSY Right 03/06/2021   Procedure: BONE BIOPSY;  Surgeon: Trula Slade, DPM;  Location: [facility];  Service: Podiatry;  Laterality: Right;   CENTRAL VENOUS CATHETER INSERTION Right 01/20/2020   Procedure: INSERTION CENTRAL LINE ADULT; Tunneled central line;  Surgeon: Virl Cagey, MD;  Location: [facility];  Service: General;  Laterality: Right;   COLONOSCOPY N/A 12/05/2014   WJX:BJYNWGNF external and internal hemorrhoid/mild diverticulosis/11 polyps removed   ESOPHAGOGASTRODUODENOSCOPY N/A 12/05/2014   AOZ:HYQM duodenitis   GRAFT APPLICATION Right 57/84/6962   Procedure: GRAFT APPLICATION;  Surgeon: Trula Slade, DPM;  Location: [facility];  Service: Podiatry;  Laterality: Right;    INCISION AND DRAINAGE OF WOUND Right 08/15/2021   Procedure: IRRIGATION AND DEBRIDEMENT WOUND;  Surgeon: Landis Martins, DPM;  Location: [facility];  Service: Podiatry;  Laterality: Right;  need to make card not  a irrigation and debridement card   IR FLUORO GUIDE CV LINE RIGHT  03/01/2019   IR PERC TUN PERIT CATH WO PORT S&I /IMAG  06/27/2021   IR REMOVAL TUN CV CATH W/O FL  05/10/2019   IR REMOVAL TUN CV CATH W/O FL  02/29/2020   IR REMOVAL TUN CV CATH W/O FL  07/19/2021   IR US GUIDE VASC ACCESS RIGHT  03/01/2019   IR US GUIDE VASC ACCESS RIGHT  06/27/2021   IRRIGATION AND DEBRIDEMENT ELBOW Left 06/24/2021   Procedure: IRRIGATION AND DEBRIDEMENT ELBOW;  Surgeon: Lovell Sheehan, MD;  Location: [facility];  Service: Orthopedics;  Laterality: Left;   KIDNEY TRANSPLANT  03/27/2015   Penile Pump Insertion     PERIPHERAL VASCULAR ATHERECTOMY  09/11/2021   Procedure: PERIPHERAL VASCULAR ATHERECTOMY;  Surgeon: Wellington Hampshire, MD;  Location: [place] [facility];  Service: Cardiovascular;;  Shockwave Lithotripsy   TEE WITHOUT CARDIOVERSION N/A 01/17/2020   Procedure: TRANSESOPHAGEAL ECHOCARDIOGRAM (TEE) WITH PROPOFOL;  Surgeon: Arnoldo Lenis, MD;  [facility];  Service: Endoscopy;  Laterality: N/A;   WOUND DEBRIDEMENT Right 03/06/2021   Procedure: DEBRIDEMENT WOUND;  Surgeon: Trula Slade, DPM;  [facility];  Service: Podiatry;  Laterality: Right;   Patient Active Problem List   Diagnosis Date Noted   Cutaneous  abscess of right foot    Hyperkalemia 08/28/2021   Obesity (BMI 30-39.9) 08/28/2021   History of anemia due to chronic kidney disease 08/28/2021   Diabetic foot ulcer ([place]) 08/14/2021   Hypomagnesemia 06/25/2021   Septic arthritis of elbow, left ([place]) 06/24/2021   Peripheral vascular disease ([place]) 04/24/2020   Pyogenic inflammation of bone ([facility])    Thrombophlebitis of superficial veins of right lower extremity    Difficult intravenous access    MRSA bacteremia     Diabetic foot infection ([place])    Infected wound 01/12/2020   Critical lower limb ischemia ([place]) 02/02/2019   Personal history of noncompliance with medical treatment, presenting hazards to health 10/07/2018   Critical limb ischemia with history of revascularization of same extremity ([place]) 07/27/2018   Mixed hyperlipidemia 02/19/2018   History of renal transplant 02/19/2018   Conductive hearing loss, bilateral 08/31/2017   Nuclear sclerotic cataract of right eye 10/07/2016   Pseudophakia of left eye 10/07/2016   Hypertension due to endocrine disorder 03/04/2016   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 ([place]) 03/04/2016   Renal failure 03/04/2016   Proliferative diabetic retinopathy with macular edema associated with type 2 diabetes mellitus ([place]) 03/04/2016   Immunosuppression ([place]) 12/10/2015   Nausea vomiting and diarrhea 12/10/2015   Type 2 diabetes mellitus ([place]) 12/10/2015   Essential hypertension, benign 12/10/2015   Pancytopenia ([place]) 12/10/2015   Encounter for screening colonoscopy 11/15/2014   GERD (gastroesophageal reflux disease) 11/15/2014    REFERRING DIAG: V03.500X (ICD-10-CM) - Below-knee amputation of right lower extremit   ONSET DATE: 04/10/2022 prosthesis delivery   THERAPY DIAG:  No diagnosis found.  Rationale for Evaluation and Treatment Rehabilitation  PERTINENT HISTORY: DM2, PVD, ESRD s/p renal transplant 03/09/2015 on immunosuppressive meds, HTN   PRECAUTIONS: None  SUBJECTIVE:                                                                                                                                                                                      SUBJECTIVE STATEMENT:  ***  He has to get batteries for scale.       PAIN:  Are you having pain? Residual limb pain 0/10 soreness from wearing prosthesis without ability to seat limb into socket.   OBJECTIVE: (objective measures  completed at initial evaluation unless otherwise dated) POSTURE: rounded shoulders, forward head, flexed trunk , and weight shift left   LOWER EXTREMITY ROM:   ROM P:passive  A:active Right eval Left eval  Hip flexion      Hip extension      Hip abduction      Hip adduction  Hip internal rotation      Hip external rotation      Knee flexion 100*    Knee extension -5*    Ankle dorsiflexion      Ankle plantarflexion      Ankle inversion      Ankle eversion       (Blank rows = not tested)   LOWER EXTREMITY MMT:   MMT Right eval Left eval  Hip flexion 3+/5    Hip extension 3-/5    Hip abduction 3-/5    Hip adduction      Hip internal rotation      Hip external rotation      Knee flexion 3-/5    Knee extension 3/5    Ankle dorsiflexion      Ankle plantarflexion      Ankle inversion      Ankle eversion      (Blank rows = not tested)   TRANSFERS: Sit to stand: 18" chair with armrests to RW with supervision.  Stand to sit: RW to 18" chair with armrests with supervision   PROSTHETIC GAIT WITH BKA PROSTHESIS: Gait pattern: step to pattern, decreased arm swing- Right, decreased step length- Right, decreased stance time- Left, decreased stride length, decreased hip/knee flexion- Right, circumduction- Right, Right hip hike, knee flexed in stance- Right, antalgic, lateral hip instability, trunk flexed, and abducted- Right Distance walked: 64' with RW & 20' with cane Assistive device utilized: Single point cane, Walker - 2 wheeled, and BKA prosthesis Level of assistance: SBA with RW and Min A Gait velocity: 0.89 ft/sec Comments: excessive weight bearing on RW, partial weight on prosthesis   FUNCTIONAL TESTs:  05/12/2022:  Merrilee Jansky Balance test 42/56 TUG with prosthesis only: std 9.96sec & cog 10.94sec      04/15/2022: Timed up and go (TUG): 39.25sec with cane & BKA prosthesis Berg Balance Scale: 24/56     CURRENT PROSTHETIC WEAR ASSESSMENT: Patient is dependent  with: skin check, residual limb care, care of non-amputated limb, prosthetic cleaning, ply sock cleaning, correct ply sock adjustment, proper wear schedule/adjustment, and proper weight-bearing schedule/adjustment Donning prosthesis: Min A Doffing prosthesis: SBA Prosthetic wear tolerance: 3 hours, 1-2x/day, 5 of 5 days since delivery Prosthetic weight bearing tolerance: 5 minutes Edema: pitting. Residual limb condition: 39m scab wound on medial incision, dry skin, normal color & temperature, cylindrical shape Prosthetic description: silicon liner with pin lock suspension, total contact socket, hydraulic ankle / K3 foot K code/activity level with prosthetic use: Level 3       TODAY'S TREATMENT:                                                                                                                             DATE:  05/27/2022: ***  05/22/2022: Prosthetic Training with TTA prosthesis: Standing on ramp in step stance with UE support on window seal - stepping RLE forward  & backward 10 reps ea LE facing uphill & downhill.  Neuromuscular Re-ed: Tandem stance on floor at sink: counting to 30 (stop count when touching) RLE in front & in back. Pt verbalized understanding as HEP. Tandem gait along counter with single support 8' X 2. Pt verbalized understanding as HEP. SLS with one forefoot on lower shelf of cabinet- counting to 10 (stop count when touching) - 2 reps ea LE Pt verbalized understanding as HEP. Rocker board - square with 2 pivot points - in //bars with light BUE support: forward/level/back & right/level/left 1 min each. UE resistance to facilitate equal weight bearing LEs - blue theraband - alternating UEs & BUEs 10 reps ea. Row, forward reach and upward reach. Pt verbalized understanding as HEP.   05/21/2022: Prosthetic Training with TTA prosthesis: In //bars with BUE support: working on 6" curb using prosthesis / RLE (step up RLE & down LLE for eccentric RLE) and on  incline board stablized to simulate ramp with prosthesis on incline in stance. 15 reps. Gait working on increasing pace by taking longer & quicker steps to "cross street" with supervision. Gait working simulating in dark by 3 steps eyes open & 3 steps eyes closed. Stepping over 4" obstacle 5 reps. PT demo & verbal cues on technique.  Neuromuscular Re-ed: Tandem stance on foam beam 30 sec RLE in front & in back with intermittent touch. Crossways stance hip width on foam beam with back of RUE on //bar to facilitate weight on RLE & some balance assist & mirror for visual input: head turns right/left, up/down & diagonals.  Tactile cues from PT.  And static stance eyes closed 10 sec. Sit to stand 10 reps from 18" chair without UE assist with cues midline using BLEs 10 reps Rocker board - square with 2 pivot points - in //bars with light BUE support: forward/level/back & right/level/left 1 min each.   HOME EXERCISE PROGRAM: Access Code: [facility] URL: [email or web address] Date: 05/22/2022 Prepared by: Jamey Reas  Exercises - Hip Flexor Stretch at [facility] of Bed  - 1-3 x daily - 7 x weekly - 1 sets - 3 reps - 20-30 seconds hold - Supine Quadriceps Stretch with Strap on Table  - 1-3 x daily - 7 x weekly - 1 sets - 3 reps - 20-30 seconds hold - Supine Bridge  - 1 x daily - 7 x weekly - 2-3 sets - 10 reps - 5 seconds hold - Marching Bridge  - 1 x daily - 7 x weekly - 2-3 sets - 10 reps - 5 seconds hold - Standing Terminal Knee Extension with Resistance  - 1 x daily - 7 x weekly - 2-3 sets - 10 reps - 5 seconds hold - Standing Hip Flexion with Resistance (Mirrored)  - 1 x daily - 7 x weekly - 1 sets - 10 reps - Standing Hip Adduction with Resistance (Mirrored)  - 1 x daily - 7 x weekly - 1 sets - 10 reps - Standing Hip Extension with Resistance  - 1 x daily - 7 x weekly - 1 sets - 10 reps - Standing Hip Abduction with Theraband Resistance  - 1 x daily - 7 x weekly - 1 sets - 10 reps -  Standing Tandem Balance with Counter Support  - 1 x daily - 7 x weekly - 1 sets - 3-4 reps - 30 seconds hold - Tandem Walking with Counter Support  - 1 x daily - 7 x weekly - 1 sets - 6 reps - single leg stance with one foot in lower cabinet  -  1 x daily - 7 x weekly - 1 sets - 5-10 reps - 10 seconds hold - Carioca with Counter Support  - 1 x daily - 7 x weekly - 1 sets - 10 reps - Alternating Punch with Resistance  - 1 x daily - 5 x weekly - 1 sets - 10 reps - 5 seconds hold - Standing Scapular Protraction with Resistance  - 1 x daily - 7 x weekly - 1 sets - 10 reps - 5 seconds hold - Standing alternate rows with resistance  - 1 x daily - 7 x weekly - 1 sets - 10 reps - 5 seconds hold - Standing Row with Anchored Resistance  - 1 x daily - 7 x weekly - 1 sets - 10 reps - 5 seconds hold - Alternating elbow flexion with resistance  - 1 x daily - 7 x weekly - 1 sets - 10 reps - 5 seconds hold - Standing Bicep Curls with Resistance  - 1 x daily - 7 x weekly - 1 sets - 10 reps - 5 seconds hold   Do each exercise 1-2  times per day Do each exercise 5-10 repetitions Hold each exercise for 2 seconds to feel your location  AT [place].  Try to find this position when standing still for activities.   USE TAPE ON FLOOR TO MARK THE MIDLINE POSITION which is even with middle of sink.  You also should try to feel with your limb pressure in socket.  You are trying to feel with limb what you used to feel with the bottom of your foot.  Side to Side Shift: Moving your hips only (not shoulders): move weight onto your left leg, HOLD/FEEL pressure in socket.  Move back to equal weight on each leg, HOLD/FEEL pressure in socket. Move weight onto your right leg, HOLD/FEEL pressure in socket. Move back to equal weight on each leg, HOLD/FEEL pressure in socket. Repeat.  Start with both hands on sink, progress to hand on prosthetic side only, then no  hands.  Front to Back Shift: Moving your hips only (not shoulders): move your weight forward onto your toes, HOLD/FEEL pressure in socket. Move your weight back to equal Flat Foot on both legs, HOLD/FEEL  pressure in socket. Move your weight back onto your heels, HOLD/FEEL  pressure in socket. Move your weight back to equal on both legs, HOLD/FEEL  pressure in socket. Repeat.  Start with both hands on sink, progress to hand on prosthetic side only, then no hands.  Moving Cones / Cups: With equal weight on each leg: Hold on with one hand the first time, then progress to no hand supports. Move cups from one side of sink to the other. Place cups ~2" out of your reach, progress to 10" beyond reach.  Place one hand in middle of sink and reach with other hand. Do both arms.  Then hover one hand and move cups with other hand.  Overhead/Upward Reaching: alternated reaching up to top cabinets or ceiling if no cabinets present. Keep equal weight on each leg. Start with one hand support on counter while other hand reaches and progress to no hand support with reaching.  ace one hand in middle of sink and reach with other hand. Do both arms.  Then hover one hand and move cups with other hand.  5.   Looking Over Shoulders: With equal weight on each leg: alternate turning  to look over your shoulders with one hand support on counter as needed.  Start with head motions only to look in front of shoulder, then even with shoulder and progress to looking behind you. To look to side, move head /eyes, then shoulder on side looking pulls back, shift more weight to side looking and pull hip back. Place one hand in middle of sink and let go with other hand so your shoulder can pull back. Switch hands to look other way.   Then hover one hand and look over shoulder. If looking right, use left hand at sink. If looking left, use right hand at sink. 6.  Stepping with leg that is not amputated:  Move items under cabinet out of your way. Shift  your hips/pelvis so weight on prosthesis. Tighten muscles in hip on prosthetic side.  SLOWLY step other leg so front of foot is in cabinet. Then step back to floor.      ASSESSMENT:   CLINICAL IMPRESSION: *** Patient appears to understand updated HEP.  Patient is improving his balance & function with prosthesis.   OBJECTIVE IMPAIRMENTS: Abnormal gait, decreased activity tolerance, decreased balance, decreased endurance, decreased knowledge of condition, decreased knowledge of use of DME, decreased mobility, decreased ROM, decreased strength, increased edema, impaired flexibility, postural dysfunction, prosthetic dependency , obesity, and wound on residual limb .    ACTIVITY LIMITATIONS: carrying, lifting, bending, standing, squatting, stairs, transfers, locomotion level, and prosthetic use   PARTICIPATION LIMITATIONS: meal prep, cleaning, laundry, driving, community activity, occupation, and yard work   PERSONAL FACTORS: Fitness, Time since onset of injury/illness/exacerbation, and 3+ comorbidities: see PMH  are also affecting patient's functional outcome.    REHAB POTENTIAL: Good   CLINICAL DECISION MAKING: Evolving/moderate complexity   EVALUATION COMPLEXITY: Moderate     GOALS: Goals reviewed with patient? Yes   SHORT TERM GOALS: Target date: 06/20/2022   Patient verbalizes how to adjust ply socks. Baseline: SEE OBJECTIVE DATA Goal status: SET 05/21/2022 2.  Patient tolerates prosthesis >90% awake hrs /day without skin issues or limb pain after standing. Baseline: SEE OBJECTIVE DATA Goal status: SET 05/21/2022   3. Berg Balance >45/56 Baseline: SEE OBJECTIVE DATA Goal status:  SET 05/21/2022   4. Patient ambulates 400' with prosthesis with supervision. Baseline: SEE OBJECTIVE DATA Goal status: SET 05/21/2022   5. Patient negotiates ramps & curbs with prosthesis with supervision Baseline: SEE OBJECTIVE DATA Goal status: SET 05/21/2022   LONG TERM GOALS: Target date:  07/31/2021   Patient demonstrates & verbalized understanding of prosthetic care to enable safe utilization of prosthesis. Baseline: SEE OBJECTIVE DATA Goal status: INITIAL   Patient tolerates prosthesis wear >90% of awake hours without skin or limb pain issues. Baseline: SEE OBJECTIVE DATA Goal status: INITIAL   Functional Gait Assessment >22/30 to indicate lower fall risk Baseline: SEE OBJECTIVE DATA Goal status: INITIAL   Patient ambulates >500' with prosthesis only independently Baseline: SEE OBJECTIVE DATA Goal status: INITIAL   Patient negotiates ramps, curbs & stairs with single rail with prosthesis only independently. Baseline: SEE OBJECTIVE DATA Goal status: INITIAL   Patient demonstrates & verbalizes lifting, carrying, pushing, pulling with prosthesis only safely. Baseline: SEE OBJECTIVE DATA Goal status: INITIAL     PLAN:   PT FREQUENCY: 3x/week for 5 weeks, then 1-2 x/wk for 10 weeks   PT DURATION: other: 15 weeks   PLANNED INTERVENTIONS: Therapeutic exercises, Therapeutic activity, Neuromuscular re-education, Balance training, Gait training, Patient/Family education, Self Care, Stair training, Vestibular training, Prosthetic training, DME  instructions, and physical performance testing   PLAN FOR NEXT SESSION:  *** verbally check on updated HEP,  prosthetic gait with & without cane, balance activities   Jamey Reas, PT, DPT 05/27/2022, 7:33 AM

## 2022-05-28 ENCOUNTER — Ambulatory Visit (INDEPENDENT_AMBULATORY_CARE_PROVIDER_SITE_OTHER): Payer: Self-pay | Admitting: Physical Therapy

## 2022-05-28 ENCOUNTER — Encounter: Payer: Self-pay | Admitting: Physical Therapy

## 2022-05-28 DIAGNOSIS — R2681 Unsteadiness on feet: Secondary | ICD-10-CM

## 2022-05-28 DIAGNOSIS — R2689 Other abnormalities of gait and mobility: Secondary | ICD-10-CM

## 2022-05-28 DIAGNOSIS — M6281 Muscle weakness (generalized): Secondary | ICD-10-CM

## 2022-05-28 DIAGNOSIS — S81801A Unspecified open wound, right lower leg, initial encounter: Secondary | ICD-10-CM

## 2022-05-28 NOTE — Therapy (Signed)
OUTPATIENT PHYSICAL THERAPY TREATMENT NOTE   Patient Name: Raymond [name] MRN: [id number] DOB:August 09, 1960, 62 y.o., male Today's Date: 05/29/2022  PCP: [name], FNP  REFERRING PROVIDER: Newt Minion, MD       END OF SESSION:   PT End of Session - 05/29/22 0853     Visit Number 15    Number of Visits 25    Date for PT Re-Evaluation 08/01/22    Authorization Type [employer]ET[employer]    Authorization Time Period NO COPAY, 35 PT/OT VISITS Calendar Year Benefit Date 05/26/2021 - 05/25/2022    Authorization - Number of Visits 35    Progress Note Due on Visit 22    PT Start Time 0851    PT Stop Time 0933    PT Time Calculation (min) 42 min    Equipment Utilized During Treatment Gait belt    Activity Tolerance Patient tolerated treatment well    Behavior During Therapy WFL for tasks assessed/performed                       Past Medical History:  Diagnosis Date   Diabetes ([place])    Diabetes mellitus without complication ([place])    type 2   ESRD (end stage renal disease) ([place])    03/09/2015- patient had a kidney transplant   Hypertension    Peripheral vascular disease ([place])    Renal disorder    Renal insufficiency    Past Surgical History:  Procedure Laterality Date   ABDOMINAL AORTOGRAM W/LOWER EXTREMITY N/A 09/11/2021   Procedure: ABDOMINAL AORTOGRAM W/LOWER EXTREMITY;  Surgeon: Wellington Hampshire, MD;  Location: [facility];  Service: Cardiovascular;  Laterality: N/A;   AMPUTATION Right 09/25/2021   Procedure: RIGHT TRANSMETATARSAL AMPUTATION AND APPLY TISSUE GRAFT;  Surgeon: Newt Minion, MD;  Location: [facility];  Service: Orthopedics;  Laterality: Right;   AMPUTATION Right 11/16/2021   Procedure: RIGHT AMPUTATION BELOW KNEE WITH WOUND VAC APPLICATION;  Surgeon: Newt Minion, MD;  Location: [facility];  Service: Orthopedics;  Laterality: Right;   BONE BIOPSY Right 03/06/2021   Procedure: BONE BIOPSY;  Surgeon: Trula Slade, DPM;  Location: [facility];   Service: Podiatry;  Laterality: Right;   CENTRAL VENOUS CATHETER INSERTION Right 01/20/2020   Procedure: INSERTION CENTRAL LINE ADULT; Tunneled central line;  Surgeon: Virl Cagey, MD;  Location: [facility];  Service: General;  Laterality: Right;   COLONOSCOPY N/A 12/05/2014   WGN:FAOZHYQM external and internal hemorrhoid/mild diverticulosis/11 polyps removed   ESOPHAGOGASTRODUODENOSCOPY N/A 12/05/2014   VHQ:IONG duodenitis   GRAFT APPLICATION Right 29/52/8413   Procedure: GRAFT APPLICATION;  Surgeon: Trula Slade, DPM;  Location: [facility];  Service: Podiatry;  Laterality: Right;   INCISION AND DRAINAGE OF WOUND Right 08/15/2021   Procedure: IRRIGATION AND DEBRIDEMENT WOUND;  Surgeon: Landis Martins, DPM;  Location: [facility];  Service: Podiatry;  Laterality: Right;  need to make card not  a irrigation and debridement card   IR FLUORO GUIDE CV LINE RIGHT  03/01/2019   IR PERC TUN PERIT CATH WO PORT S&I /IMAG  06/27/2021   IR REMOVAL TUN CV CATH W/O FL  05/10/2019   IR REMOVAL TUN CV CATH W/O FL  02/29/2020   IR REMOVAL TUN CV CATH W/O FL  07/19/2021   IR US GUIDE VASC ACCESS RIGHT  03/01/2019   IR US GUIDE VASC ACCESS RIGHT  06/27/2021   IRRIGATION AND DEBRIDEMENT ELBOW Left 06/24/2021   Procedure: IRRIGATION AND DEBRIDEMENT  ELBOW;  Surgeon: Lovell Sheehan, MD;  Location: [facility];  Service: Orthopedics;  Laterality: Left;   KIDNEY TRANSPLANT  03/27/2015   Penile Pump Insertion     PERIPHERAL VASCULAR ATHERECTOMY  09/11/2021   Procedure: PERIPHERAL VASCULAR ATHERECTOMY;  Surgeon: Wellington Hampshire, MD;  Location: [facility];  Service: Cardiovascular;;  Shockwave Lithotripsy   TEE WITHOUT CARDIOVERSION N/A 01/17/2020   Procedure: TRANSESOPHAGEAL ECHOCARDIOGRAM (TEE) WITH PROPOFOL;  Surgeon: Arnoldo Lenis, MD;  [facility];  Service: Endoscopy;  Laterality: N/A;   WOUND DEBRIDEMENT Right 03/06/2021   Procedure: DEBRIDEMENT WOUND;  Surgeon: Trula Slade, DPM;   Location: [facility];  Service: Podiatry;  Laterality: Right;   Patient Active Problem List   Diagnosis Date Noted   Cutaneous abscess of right foot    Hyperkalemia 08/28/2021   Obesity (BMI 30-39.9) 08/28/2021   History of anemia due to chronic kidney disease 08/28/2021   Diabetic foot ulcer ([place]) 08/14/2021   Hypomagnesemia 06/25/2021   Septic arthritis of elbow, left ([place]) 06/24/2021   Peripheral vascular disease ([place]) 04/24/2020   Pyogenic inflammation of bone ([facility])    Thrombophlebitis of superficial veins of right lower extremity    Difficult intravenous access    MRSA bacteremia    Diabetic foot infection ([place])    Infected wound 01/12/2020   Critical lower limb ischemia ([place]) 02/02/2019   Personal history of noncompliance with medical treatment, presenting hazards to health 10/07/2018   Critical limb ischemia with history of revascularization of same extremity ([place]) 07/27/2018   Mixed hyperlipidemia 02/19/2018   History of renal transplant 02/19/2018   Conductive hearing loss, bilateral 08/31/2017   Nuclear sclerotic cataract of right eye 10/07/2016   Pseudophakia of left eye 10/07/2016   Hypertension due to endocrine disorder 03/04/2016   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 ([place]) 03/04/2016   Renal failure 03/04/2016   Proliferative diabetic retinopathy with macular edema associated with type 2 diabetes mellitus ([place]) 03/04/2016   Immunosuppression ([place]) 12/10/2015   Nausea vomiting and diarrhea 12/10/2015   Type 2 diabetes mellitus ([place]) 12/10/2015   Essential hypertension, benign 12/10/2015   Pancytopenia ([place]) 12/10/2015   Encounter for screening colonoscopy 11/15/2014   GERD (gastroesophageal reflux disease) 11/15/2014    REFERRING DIAG: N82.956O (ICD-10-CM) - Below-knee amputation of right lower extremit   ONSET DATE: 04/10/2022 prosthesis delivery   THERAPY DIAG:  Unsteadiness on feet  Other  abnormalities of gait and mobility  Muscle weakness (generalized)  Wound of right lower extremity, initial encounter  Rationale for Evaluation and Treatment Rehabilitation  PERTINENT HISTORY: DM2, PVD, ESRD s/p renal transplant 03/09/2015 on immunosuppressive meds, HTN   PRECAUTIONS: None  SUBJECTIVE:  SUBJECTIVE STATEMENT:  He sees prosthetist next week.      PAIN:  Are you having pain? Residual limb pain 0/10 soreness from wearing prosthesis without ability to seat limb into socket.   OBJECTIVE: (objective measures completed at initial evaluation unless otherwise dated) POSTURE: rounded shoulders, forward head, flexed trunk , and weight shift left   LOWER EXTREMITY ROM:   ROM P:passive  A:active Right eval Left eval  Hip flexion      Hip extension      Hip abduction      Hip adduction      Hip internal rotation      Hip external rotation      Knee flexion 100*    Knee extension -5*    Ankle dorsiflexion      Ankle plantarflexion      Ankle inversion      Ankle eversion       (Blank rows = not tested)   LOWER EXTREMITY MMT:   MMT Right eval Left eval  Hip flexion 3+/5    Hip extension 3-/5    Hip abduction 3-/5    Hip adduction      Hip internal rotation      Hip external rotation      Knee flexion 3-/5    Knee extension 3/5    Ankle dorsiflexion      Ankle plantarflexion      Ankle inversion      Ankle eversion      (Blank rows = not tested)   TRANSFERS: Sit to stand: 18" chair with armrests to RW with supervision.  Stand to sit: RW to 18" chair with armrests with supervision   PROSTHETIC GAIT WITH BKA PROSTHESIS: Gait pattern: step to pattern, decreased arm swing- Right, decreased step length- Right, decreased stance time- Left, decreased stride length, decreased hip/knee  flexion- Right, circumduction- Right, Right hip hike, knee flexed in stance- Right, antalgic, lateral hip instability, trunk flexed, and abducted- Right Distance walked: 72' with RW & 20' with cane Assistive device utilized: Single point cane, Walker - 2 wheeled, and BKA prosthesis Level of assistance: SBA with RW and Min A Gait velocity: 0.89 ft/sec Comments: excessive weight bearing on RW, partial weight on prosthesis   FUNCTIONAL TESTs:  05/12/2022:  Merrilee Jansky Balance test 42/56 TUG with prosthesis only: std 9.96sec & cog 10.94sec      04/15/2022: Timed up and go (TUG): 39.25sec with cane & BKA prosthesis Berg Balance Scale: 24/56     CURRENT PROSTHETIC WEAR ASSESSMENT: Patient is dependent with: skin check, residual limb care, care of non-amputated limb, prosthetic cleaning, ply sock cleaning, correct ply sock adjustment, proper wear schedule/adjustment, and proper weight-bearing schedule/adjustment Donning prosthesis: Min A Doffing prosthesis: SBA Prosthetic wear tolerance: 3 hours, 1-2x/day, 5 of 5 days since delivery Prosthetic weight bearing tolerance: 5 minutes Edema: pitting. Residual limb condition: 64m scab wound on medial incision, dry skin, normal color & temperature, cylindrical shape Prosthetic description: silicon liner with pin lock suspension, total contact socket, hydraulic ankle / K3 foot K code/activity level with prosthetic use: Level 3       TODAY'S TREATMENT:  DATE:  05/29/2021: Prosthetic Training with TTA prosthesis: Pre-gait in //bars in step position working on wt shift over prosthesis without hip or trunk collapse. Progressed to gait in//bars with carryover. Gait working on arm swing with counter trunk rotation with pt/PT holding pipes. Verbal cues for pelvic wt shift over prosthesis in stance. Pelvic wt shift with green theraband  resistance cues to maintain trunk without lateral lean. Progressed to tapping LLE on cone. Pelvic wt shift with right grazing counter with RUE on counter working on trunk upright without lateral lean - stepping LLE forward / back for RLE stance.  Red theraband steps with LLE for RLE stance without dipping or trunk lean LUE support 15 reps ea flex, add, ext & abd   05/28/2022: Prosthetic Training with TTA prosthesis: PT reviewed adjusting ply socks with recommendation to not try flexible inner socket unless using 10-ply or more. Pt  verbalized understanding.  Neuromuscular Re-ed: Standing in corner: eyes open on floor with feet together head turns right/left, up/down & 2 diagonals 5-8 reps ea;  eyes closed on floor with feet hip width head turns right/left, up/down & 2 diagonals 5-8 reps ea; eyes open on foam with feet hip width head turns right/left, up/down & 2 diagonals 5-8 reps ea;  Standing in corner - Step strategy facilitation - anticipatory stepping off towel roll forward, backward & sideways with BLEs 5 reps each.    05/22/2022: Prosthetic Training with TTA prosthesis: Standing on ramp in step stance with UE support on window seal - stepping RLE forward  & backward 10 reps ea LE facing uphill & downhill.   Neuromuscular Re-ed: Tandem stance on floor at sink: counting to 30 (stop count when touching) RLE in front & in back. Pt verbalized understanding as HEP. Tandem gait along counter with single support 8' X 2. Pt verbalized understanding as HEP. SLS with one forefoot on lower shelf of cabinet- counting to 10 (stop count when touching) - 2 reps ea LE Pt verbalized understanding as HEP. Rocker board - square with 2 pivot points - in //bars with light BUE support: forward/level/back & right/level/left 1 min each. UE resistance to facilitate equal weight bearing LEs - blue theraband - alternating UEs & BUEs 10 reps ea. Row, forward reach and upward reach. Pt verbalized understanding as  HEP.   HOME EXERCISE PROGRAM: Access Code: [facility] URL: [email or web address] Date: 05/22/2022 Prepared by: Jamey Reas  Exercises - Hip Flexor Stretch at [facility] of Bed  - 1-3 x daily - 7 x weekly - 1 sets - 3 reps - 20-30 seconds hold - Supine Quadriceps Stretch with Strap on Table  - 1-3 x daily - 7 x weekly - 1 sets - 3 reps - 20-30 seconds hold - Supine Bridge  - 1 x daily - 7 x weekly - 2-3 sets - 10 reps - 5 seconds hold - Marching Bridge  - 1 x daily - 7 x weekly - 2-3 sets - 10 reps - 5 seconds hold - Standing Terminal Knee Extension with Resistance  - 1 x daily - 7 x weekly - 2-3 sets - 10 reps - 5 seconds hold - Standing Hip Flexion with Resistance (Mirrored)  - 1 x daily - 7 x weekly - 1 sets - 10 reps - Standing Hip Adduction with Resistance (Mirrored)  - 1 x daily - 7 x weekly - 1 sets - 10 reps - Standing Hip Extension with Resistance  - 1 x daily - 7 x weekly - 1 sets -  10 reps - Standing Hip Abduction with Theraband Resistance  - 1 x daily - 7 x weekly - 1 sets - 10 reps - Standing Tandem Balance with Counter Support  - 1 x daily - 7 x weekly - 1 sets - 3-4 reps - 30 seconds hold - Tandem Walking with Counter Support  - 1 x daily - 7 x weekly - 1 sets - 6 reps - single leg stance with one foot in lower cabinet  - 1 x daily - 7 x weekly - 1 sets - 5-10 reps - 10 seconds hold - Carioca with Counter Support  - 1 x daily - 7 x weekly - 1 sets - 10 reps - Alternating Punch with Resistance  - 1 x daily - 5 x weekly - 1 sets - 10 reps - 5 seconds hold - Standing Scapular Protraction with Resistance  - 1 x daily - 7 x weekly - 1 sets - 10 reps - 5 seconds hold - Standing alternate rows with resistance  - 1 x daily - 7 x weekly - 1 sets - 10 reps - 5 seconds hold - Standing Row with Anchored Resistance  - 1 x daily - 7 x weekly - 1 sets - 10 reps - 5 seconds hold - Alternating elbow flexion with resistance  - 1 x daily - 7 x weekly - 1 sets - 10 reps - 5 seconds  hold - Standing Bicep Curls with Resistance  - 1 x daily - 7 x weekly - 1 sets - 10 reps - 5 seconds hold   Do each exercise 1-2  times per day Do each exercise 5-10 repetitions Hold each exercise for 2 seconds to feel your location  AT [place].  Try to find this position when standing still for activities.   USE TAPE ON FLOOR TO MARK THE MIDLINE POSITION which is even with middle of sink.  You also should try to feel with your limb pressure in socket.  You are trying to feel with limb what you used to feel with the bottom of your foot.  Side to Side Shift: Moving your hips only (not shoulders): move weight onto your left leg, HOLD/FEEL pressure in socket.  Move back to equal weight on each leg, HOLD/FEEL pressure in socket. Move weight onto your right leg, HOLD/FEEL pressure in socket. Move back to equal weight on each leg, HOLD/FEEL pressure in socket. Repeat.  Start with both hands on sink, progress to hand on prosthetic side only, then no hands.  Front to Back Shift: Moving your hips only (not shoulders): move your weight forward onto your toes, HOLD/FEEL pressure in socket. Move your weight back to equal Flat Foot on both legs, HOLD/FEEL  pressure in socket. Move your weight back onto your heels, HOLD/FEEL  pressure in socket. Move your weight back to equal on both legs, HOLD/FEEL  pressure in socket. Repeat.  Start with both hands on sink, progress to hand on prosthetic side only, then no hands.  Moving Cones / Cups: With equal weight on each leg: Hold on with one hand the first time, then progress to no hand supports. Move cups from one side of sink to the other. Place cups ~2" out of your reach, progress to 10" beyond reach.  Place one hand in middle of sink and reach with other hand. Do both arms.  Then hover one hand and move cups with other  hand.  Overhead/Upward Reaching: alternated reaching up to top cabinets or  ceiling if no cabinets present. Keep equal weight on each leg. Start with one hand support on counter while other hand reaches and progress to no hand support with reaching.  ace one hand in middle of sink and reach with other hand. Do both arms.  Then hover one hand and move cups with other hand.  5.   Looking Over Shoulders: With equal weight on each leg: alternate turning to look over your shoulders with one hand support on counter as needed.  Start with head motions only to look in front of shoulder, then even with shoulder and progress to looking behind you. To look to side, move head /eyes, then shoulder on side looking pulls back, shift more weight to side looking and pull hip back. Place one hand in middle of sink and let go with other hand so your shoulder can pull back. Switch hands to look other way.   Then hover one hand and look over shoulder. If looking right, use left hand at sink. If looking left, use right hand at sink. 6.  Stepping with leg that is not amputated:  Move items under cabinet out of your way. Shift your hips/pelvis so weight on prosthesis. Tighten muscles in hip on prosthetic side.  SLOWLY step other leg so front of foot is in cabinet. Then step back to floor.      ASSESSMENT:   CLINICAL IMPRESSION: PT session focused on wt shift over prosthesis in stance with hip/pelvic/trunk stability.  He improved when focused & PT cueing.   OBJECTIVE IMPAIRMENTS: Abnormal gait, decreased activity tolerance, decreased balance, decreased endurance, decreased knowledge of condition, decreased knowledge of use of DME, decreased mobility, decreased ROM, decreased strength, increased edema, impaired flexibility, postural dysfunction, prosthetic dependency , obesity, and wound on residual limb .    ACTIVITY LIMITATIONS: carrying, lifting, bending, standing, squatting, stairs, transfers, locomotion level, and prosthetic use   PARTICIPATION LIMITATIONS: meal prep, cleaning, laundry, driving,  community activity, occupation, and yard work   PERSONAL FACTORS: Fitness, Time since onset of injury/illness/exacerbation, and 3+ comorbidities: see PMH  are also affecting patient's functional outcome.    REHAB POTENTIAL: Good   CLINICAL DECISION MAKING: Evolving/moderate complexity   EVALUATION COMPLEXITY: Moderate     GOALS: Goals reviewed with patient? Yes   SHORT TERM GOALS: Target date: 06/20/2022   Patient verbalizes how to adjust ply socks. Baseline: SEE OBJECTIVE DATA Goal status: Ongoing 05/29/2022 2.  Patient tolerates prosthesis >90% awake hrs /day without skin issues or limb pain after standing. Baseline: SEE OBJECTIVE DATA Goal status: Ongoing 05/29/2022   3. Berg Balance >45/56 Baseline: SEE OBJECTIVE DATA Goal status:  Ongoing 05/29/2022   4. Patient ambulates 400' with prosthesis with supervision. Baseline: SEE OBJECTIVE DATA Goal status: Ongoing 05/29/2022   5. Patient negotiates ramps & curbs with prosthesis with supervision Baseline: SEE OBJECTIVE DATA Goal status: Ongoing 05/29/2022   LONG TERM GOALS: Target date: 07/31/2021   Patient demonstrates & verbalized understanding of prosthetic care to enable safe utilization of prosthesis. Baseline: SEE OBJECTIVE DATA Goal status: Ongoing 05/29/2022   Patient tolerates prosthesis wear >90% of awake hours without skin or limb pain issues. Baseline: SEE OBJECTIVE DATA Goal status: Ongoing 05/29/2022   Functional Gait Assessment >22/30 to indicate lower fall risk Baseline: SEE OBJECTIVE DATA Goal status: Ongoing 05/29/2022   Patient ambulates >500' with prosthesis only independently Baseline: SEE OBJECTIVE DATA Goal status: Ongoing 05/29/2022   Patient  negotiates ramps, curbs & stairs with single rail with prosthesis only independently. Baseline: SEE OBJECTIVE DATA Goal status: Ongoing 05/29/2022   Patient demonstrates & verbalizes lifting, carrying, pushing, pulling with prosthesis only safely. Baseline: SEE OBJECTIVE  DATA Goal status:  Ongoing 05/29/2022     PLAN:   PT FREQUENCY: 3x/week for 5 weeks, then 1-2 x/wk for 10 weeks   PT DURATION: other: 15 weeks   PLANNED INTERVENTIONS: Therapeutic exercises, Therapeutic activity, Neuromuscular re-education, Balance training, Gait training, Patient/Family education, Self Care, Stair training, Vestibular training, Prosthetic training, DME instructions, and physical performance testing   PLAN FOR NEXT SESSION:   continue prosthetic gait without cane, balance activities working on [employer], PT, DPT 05/29/2022, 10:41 AM

## 2022-05-28 NOTE — Therapy (Signed)
OUTPATIENT PHYSICAL THERAPY TREATMENT NOTE   Patient Name: Raymond [name] MRN: [id number] DOB:September 09, 1960, 62 y.o., male Today's Date: 05/28/2022  PCP: [name], FNP  REFERRING PROVIDER: Newt Minion, MD       END OF SESSION:   PT End of Session - 05/28/22 0903     Visit Number 14    Number of Visits 25    Date for PT Re-Evaluation 08/01/22    Authorization Type AET[employer]    Authorization Time Period NO COPAY, 35 PT/OT VISITS Calendar Year Benefit Date 05/26/2021 - 05/25/2022    Authorization - Number of Visits 35    Progress Note Due on Visit 20    PT Start Time 0900    PT Stop Time 0928    PT Time Calculation (min) 28 min    Equipment Utilized During Treatment Gait belt    Activity Tolerance Patient tolerated treatment well    Behavior During Therapy WFL for tasks assessed/performed                      Past Medical History:  Diagnosis Date   Diabetes ([place])    Diabetes mellitus without complication ([place])    type 2   ESRD (end stage renal disease) ([place])    03/09/2015- patient had a kidney transplant   Hypertension    Peripheral vascular disease ([place])    Renal disorder    Renal insufficiency    Past Surgical History:  Procedure Laterality Date   ABDOMINAL AORTOGRAM W/LOWER EXTREMITY N/A 09/11/2021   Procedure: ABDOMINAL AORTOGRAM W/LOWER EXTREMITY;  Surgeon: Wellington Hampshire, MD;  Location: [facility];  Service: Cardiovascular;  Laterality: N/A;   AMPUTATION Right 09/25/2021   Procedure: RIGHT TRANSMETATARSAL AMPUTATION AND APPLY TISSUE GRAFT;  Surgeon: Newt Minion, MD;  Location: [facility];  Service: Orthopedics;  Laterality: Right;   AMPUTATION Right 11/16/2021   Procedure: RIGHT AMPUTATION BELOW KNEE WITH WOUND VAC APPLICATION;  Surgeon: Newt Minion, MD;  Location: [facility];  Service: Orthopedics;  Laterality: Right;   BONE BIOPSY Right 03/06/2021   Procedure: BONE BIOPSY;  Surgeon: Trula Slade, DPM;  Location: [facility];  Service:  Podiatry;  Laterality: Right;   CENTRAL VENOUS CATHETER INSERTION Right 01/20/2020   Procedure: INSERTION CENTRAL LINE ADULT; Tunneled central line;  Surgeon: Virl Cagey, MD;  Location: [facility];  Service: General;  Laterality: Right;   COLONOSCOPY N/A 12/05/2014   NGE:[id number] external and internal hemorrhoid/mild diverticulosis/11 polyps removed   ESOPHAGOGASTRODUODENOSCOPY N/A 12/05/2014   GMW:NUUV duodenitis   GRAFT APPLICATION Right 25/36/6440   Procedure: GRAFT APPLICATION;  Surgeon: Trula Slade, DPM;  Location: [facility];  Service: Podiatry;  Laterality: Right;   INCISION AND DRAINAGE OF WOUND Right 08/15/2021   Procedure: IRRIGATION AND DEBRIDEMENT WOUND;  Surgeon: Landis Martins, DPM;  Location: [facility];  Service: Podiatry;  Laterality: Right;  need to make card not  a irrigation and debridement card   IR FLUORO GUIDE CV LINE RIGHT  03/01/2019   IR PERC TUN PERIT CATH WO PORT S&I /IMAG  06/27/2021   IR REMOVAL TUN CV CATH W/O FL  05/10/2019   IR REMOVAL TUN CV CATH W/O FL  02/29/2020   IR REMOVAL TUN CV CATH W/O FL  07/19/2021   IR US GUIDE VASC ACCESS RIGHT  03/01/2019   IR US GUIDE VASC ACCESS RIGHT  06/27/2021   IRRIGATION AND DEBRIDEMENT ELBOW Left 06/24/2021   Procedure: IRRIGATION AND DEBRIDEMENT ELBOW;  Surgeon: Lovell Sheehan, MD;  Location: [facility];  Service: Orthopedics;  Laterality: Left;   KIDNEY TRANSPLANT  03/27/2015   Penile Pump Insertion     PERIPHERAL VASCULAR ATHERECTOMY  09/11/2021   Procedure: PERIPHERAL VASCULAR ATHERECTOMY;  Surgeon: Wellington Hampshire, MD;  Location: [facility];  Service: Cardiovascular;;  [facility] Lithotripsy   TEE WITHOUT CARDIOVERSION N/A 01/17/2020   Procedure: TRANSESOPHAGEAL ECHOCARDIOGRAM (TEE) WITH PROPOFOL;  Surgeon: Arnoldo Lenis, MD;  [facility];  Service: Endoscopy;  Laterality: N/A;   WOUND DEBRIDEMENT Right 03/06/2021   Procedure: DEBRIDEMENT WOUND;  Surgeon: Trula Slade, DPM;  [facility];  Service: Podiatry;  Laterality: Right;   Patient Active Problem List   Diagnosis Date Noted   Cutaneous abscess of right foot    Hyperkalemia 08/28/2021   Obesity (BMI 30-39.9) 08/28/2021   History of anemia due to chronic kidney disease 08/28/2021   Diabetic foot ulcer ([place]) 08/14/2021   Hypomagnesemia 06/25/2021   Septic arthritis of elbow, left ([place]) 06/24/2021   Peripheral vascular disease ([place]) 04/24/2020   Pyogenic inflammation of bone ([facility])    Thrombophlebitis of superficial veins of right lower extremity    Difficult intravenous access    MRSA bacteremia    Diabetic foot infection ([place])    Infected wound 01/12/2020   Critical lower limb ischemia ([place]) 02/02/2019   Personal history of noncompliance with medical treatment, presenting hazards to health 10/07/2018   Critical limb ischemia with history of revascularization of same extremity ([place]) 07/27/2018   Mixed hyperlipidemia 02/19/2018   History of renal transplant 02/19/2018   Conductive hearing loss, bilateral 08/31/2017   Nuclear sclerotic cataract of right eye 10/07/2016   Pseudophakia of left eye 10/07/2016   Hypertension due to endocrine disorder 03/04/2016   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 ([place]) 03/04/2016   Renal failure 03/04/2016   Proliferative diabetic retinopathy with macular edema associated with type 2 diabetes mellitus ([place]) 03/04/2016   Immunosuppression ([place]) 12/10/2015   Nausea vomiting and diarrhea 12/10/2015   Type 2 diabetes mellitus ([place]) 12/10/2015   Essential hypertension, benign 12/10/2015   Pancytopenia ([place]) 12/10/2015   Encounter for screening colonoscopy 11/15/2014   GERD (gastroesophageal reflux disease) 11/15/2014    REFERRING DIAG: Z61.096E (ICD-10-CM) - Below-knee amputation of right lower extremit   ONSET DATE: 04/10/2022 prosthesis delivery   THERAPY DIAG:  Unsteadiness on feet  Other  abnormalities of gait and mobility  Muscle weakness (generalized)  Wound of right lower extremity, initial encounter  Rationale for Evaluation and Treatment Rehabilitation  PERTINENT HISTORY: DM2, PVD, ESRD s/p renal transplant 03/09/2015 on immunosuppressive meds, HTN   PRECAUTIONS: None  SUBJECTIVE:  SUBJECTIVE STATEMENT:  He bought scale & has been weighing each morning.  He has been doing his exercises.       PAIN:  Are you having pain? Residual limb pain 0/10 soreness from wearing prosthesis without ability to seat limb into socket.   OBJECTIVE: (objective measures completed at initial evaluation unless otherwise dated) POSTURE: rounded shoulders, forward head, flexed trunk , and weight shift left   LOWER EXTREMITY ROM:   ROM P:passive  A:active Right eval Left eval  Hip flexion      Hip extension      Hip abduction      Hip adduction      Hip internal rotation      Hip external rotation      Knee flexion 100*    Knee extension -5*    Ankle dorsiflexion      Ankle plantarflexion      Ankle inversion      Ankle eversion       (Blank rows = not tested)   LOWER EXTREMITY MMT:   MMT Right eval Left eval  Hip flexion 3+/5    Hip extension 3-/5    Hip abduction 3-/5    Hip adduction      Hip internal rotation      Hip external rotation      Knee flexion 3-/5    Knee extension 3/5    Ankle dorsiflexion      Ankle plantarflexion      Ankle inversion      Ankle eversion      (Blank rows = not tested)   TRANSFERS: Sit to stand: 18" chair with armrests to RW with supervision.  Stand to sit: RW to 18" chair with armrests with supervision   PROSTHETIC GAIT WITH BKA PROSTHESIS: Gait pattern: step to pattern, decreased arm swing- Right, decreased step length- Right, decreased stance  time- Left, decreased stride length, decreased hip/knee flexion- Right, circumduction- Right, Right hip hike, knee flexed in stance- Right, antalgic, lateral hip instability, trunk flexed, and abducted- Right Distance walked: 76' with RW & 20' with cane Assistive device utilized: Single point cane, Walker - 2 wheeled, and BKA prosthesis Level of assistance: SBA with RW and Min A Gait velocity: 0.89 ft/sec Comments: excessive weight bearing on RW, partial weight on prosthesis   FUNCTIONAL TESTs:  05/12/2022:  Merrilee Jansky Balance test 42/56 TUG with prosthesis only: std 9.96sec & cog 10.94sec      04/15/2022: Timed up and go (TUG): 39.25sec with cane & BKA prosthesis Berg Balance Scale: 24/56     CURRENT PROSTHETIC WEAR ASSESSMENT: Patient is dependent with: skin check, residual limb care, care of non-amputated limb, prosthetic cleaning, ply sock cleaning, correct ply sock adjustment, proper wear schedule/adjustment, and proper weight-bearing schedule/adjustment Donning prosthesis: Min A Doffing prosthesis: SBA Prosthetic wear tolerance: 3 hours, 1-2x/day, 5 of 5 days since delivery Prosthetic weight bearing tolerance: 5 minutes Edema: pitting. Residual limb condition: 2m scab wound on medial incision, dry skin, normal color & temperature, cylindrical shape Prosthetic description: silicon liner with pin lock suspension, total contact socket, hydraulic ankle / K3 foot K code/activity level with prosthetic use: Level 3       TODAY'S TREATMENT:  DATE:  05/28/2022: Prosthetic Training with TTA prosthesis: PT reviewed adjusting ply socks with recommendation to not try flexible inner socket unless using 10-ply or more. Pt  verbalized understanding.  Neuromuscular Re-ed: Standing in corner: eyes open on floor with feet together head turns right/left, up/down & 2 diagonals  5-8 reps ea;  eyes closed on floor with feet hip width head turns right/left, up/down & 2 diagonals 5-8 reps ea; eyes open on foam with feet hip width head turns right/left, up/down & 2 diagonals 5-8 reps ea;  Standing in corner - Step strategy facilitation - anticipatory stepping off towel roll forward, backward & sideways with BLEs 5 reps each.    05/22/2022: Prosthetic Training with TTA prosthesis: Standing on ramp in step stance with UE support on window seal - stepping RLE forward  & backward 10 reps ea LE facing uphill & downhill.   Neuromuscular Re-ed: Tandem stance on floor at sink: counting to 30 (stop count when touching) RLE in front & in back. Pt verbalized understanding as HEP. Tandem gait along counter with single support 8' X 2. Pt verbalized understanding as HEP. SLS with one forefoot on lower shelf of cabinet- counting to 10 (stop count when touching) - 2 reps ea LE Pt verbalized understanding as HEP. Rocker board - square with 2 pivot points - in //bars with light BUE support: forward/level/back & right/level/left 1 min each. UE resistance to facilitate equal weight bearing LEs - blue theraband - alternating UEs & BUEs 10 reps ea. Row, forward reach and upward reach. Pt verbalized understanding as HEP.   05/21/2022: Prosthetic Training with TTA prosthesis: In //bars with BUE support: working on 6" curb using prosthesis / RLE (step up RLE & down LLE for eccentric RLE) and on incline board stablized to simulate ramp with prosthesis on incline in stance. 15 reps. Gait working on increasing pace by taking longer & quicker steps to "cross street" with supervision. Gait working simulating in dark by 3 steps eyes open & 3 steps eyes closed. Stepping over 4" obstacle 5 reps. PT demo & verbal cues on technique.  Neuromuscular Re-ed: Tandem stance on foam beam 30 sec RLE in front & in back with intermittent touch. Crossways stance hip width on foam beam with back of RUE on //bar to  facilitate weight on RLE & some balance assist & mirror for visual input: head turns right/left, up/down & diagonals.  Tactile cues from PT.  And static stance eyes closed 10 sec. Sit to stand 10 reps from 18" chair without UE assist with cues midline using BLEs 10 reps Rocker board - square with 2 pivot points - in //bars with light BUE support: forward/level/back & right/level/left 1 min each.   HOME EXERCISE PROGRAM: Access Code: [facility] URL: [email or web address] Date: 05/22/2022 Prepared by: Jamey Reas  Exercises - Hip Flexor Stretch at [facility] of Bed  - 1-3 x daily - 7 x weekly - 1 sets - 3 reps - 20-30 seconds hold - Supine Quadriceps Stretch with Strap on Table  - 1-3 x daily - 7 x weekly - 1 sets - 3 reps - 20-30 seconds hold - Supine Bridge  - 1 x daily - 7 x weekly - 2-3 sets - 10 reps - 5 seconds hold - Marching Bridge  - 1 x daily - 7 x weekly - 2-3 sets - 10 reps - 5 seconds hold - Standing Terminal Knee Extension with Resistance  - 1 x daily - 7 x weekly - 2-3 sets -  10 reps - 5 seconds hold - Standing Hip Flexion with Resistance (Mirrored)  - 1 x daily - 7 x weekly - 1 sets - 10 reps - Standing Hip Adduction with Resistance (Mirrored)  - 1 x daily - 7 x weekly - 1 sets - 10 reps - Standing Hip Extension with Resistance  - 1 x daily - 7 x weekly - 1 sets - 10 reps - Standing Hip Abduction with Theraband Resistance  - 1 x daily - 7 x weekly - 1 sets - 10 reps - Standing Tandem Balance with Counter Support  - 1 x daily - 7 x weekly - 1 sets - 3-4 reps - 30 seconds hold - Tandem Walking with Counter Support  - 1 x daily - 7 x weekly - 1 sets - 6 reps - single leg stance with one foot in lower cabinet  - 1 x daily - 7 x weekly - 1 sets - 5-10 reps - 10 seconds hold - Carioca with Counter Support  - 1 x daily - 7 x weekly - 1 sets - 10 reps - Alternating Punch with Resistance  - 1 x daily - 5 x weekly - 1 sets - 10 reps - 5 seconds hold - Standing Scapular  Protraction with Resistance  - 1 x daily - 7 x weekly - 1 sets - 10 reps - 5 seconds hold - Standing alternate rows with resistance  - 1 x daily - 7 x weekly - 1 sets - 10 reps - 5 seconds hold - Standing Row with Anchored Resistance  - 1 x daily - 7 x weekly - 1 sets - 10 reps - 5 seconds hold - Alternating elbow flexion with resistance  - 1 x daily - 7 x weekly - 1 sets - 10 reps - 5 seconds hold - Standing Bicep Curls with Resistance  - 1 x daily - 7 x weekly - 1 sets - 10 reps - 5 seconds hold   Do each exercise 1-2  times per day Do each exercise 5-10 repetitions Hold each exercise for 2 seconds to feel your location  AT [place].  Try to find this position when standing still for activities.   USE TAPE ON FLOOR TO MARK THE MIDLINE POSITION which is even with middle of sink.  You also should try to feel with your limb pressure in socket.  You are trying to feel with limb what you used to feel with the bottom of your foot.  Side to Side Shift: Moving your hips only (not shoulders): move weight onto your left leg, HOLD/FEEL pressure in socket.  Move back to equal weight on each leg, HOLD/FEEL pressure in socket. Move weight onto your right leg, HOLD/FEEL pressure in socket. Move back to equal weight on each leg, HOLD/FEEL pressure in socket. Repeat.  Start with both hands on sink, progress to hand on prosthetic side only, then no hands.  Front to Back Shift: Moving your hips only (not shoulders): move your weight forward onto your toes, HOLD/FEEL pressure in socket. Move your weight back to equal Flat Foot on both legs, HOLD/FEEL  pressure in socket. Move your weight back onto your heels, HOLD/FEEL  pressure in socket. Move your weight back to equal on both legs, HOLD/FEEL  pressure in socket. Repeat.  Start with both hands on sink, progress to hand on prosthetic side only, then no hands.  Moving Cones /  Cups: With equal  weight on each leg: Hold on with one hand the first time, then progress to no hand supports. Move cups from one side of sink to the other. Place cups ~2" out of your reach, progress to 10" beyond reach.  Place one hand in middle of sink and reach with other hand. Do both arms.  Then hover one hand and move cups with other hand.  Overhead/Upward Reaching: alternated reaching up to top cabinets or ceiling if no cabinets present. Keep equal weight on each leg. Start with one hand support on counter while other hand reaches and progress to no hand support with reaching.  ace one hand in middle of sink and reach with other hand. Do both arms.  Then hover one hand and move cups with other hand.  5.   Looking Over Shoulders: With equal weight on each leg: alternate turning to look over your shoulders with one hand support on counter as needed.  Start with head motions only to look in front of shoulder, then even with shoulder and progress to looking behind you. To look to side, move head /eyes, then shoulder on side looking pulls back, shift more weight to side looking and pull hip back. Place one hand in middle of sink and let go with other hand so your shoulder can pull back. Switch hands to look other way.   Then hover one hand and look over shoulder. If looking right, use left hand at sink. If looking left, use right hand at sink. 6.  Stepping with leg that is not amputated:  Move items under cabinet out of your way. Shift your hips/pelvis so weight on prosthesis. Tighten muscles in hip on prosthetic side.  SLOWLY step other leg so front of foot is in cabinet. Then step back to floor.      ASSESSMENT:   CLINICAL IMPRESSION: PT session focused on balance activities facilitating ankle/residual limb, hip & step strategies.  Pt would benefit from further work on exercises under PT guidance prior to trying at home.  OBJECTIVE IMPAIRMENTS: Abnormal gait, decreased activity tolerance, decreased balance, decreased  endurance, decreased knowledge of condition, decreased knowledge of use of DME, decreased mobility, decreased ROM, decreased strength, increased edema, impaired flexibility, postural dysfunction, prosthetic dependency , obesity, and wound on residual limb .    ACTIVITY LIMITATIONS: carrying, lifting, bending, standing, squatting, stairs, transfers, locomotion level, and prosthetic use   PARTICIPATION LIMITATIONS: meal prep, cleaning, laundry, driving, community activity, occupation, and yard work   PERSONAL FACTORS: Fitness, Time since onset of injury/illness/exacerbation, and 3+ comorbidities: see PMH  are also affecting patient's functional outcome.    REHAB POTENTIAL: Good   CLINICAL DECISION MAKING: Evolving/moderate complexity   EVALUATION COMPLEXITY: Moderate     GOALS: Goals reviewed with patient? Yes   SHORT TERM GOALS: Target date: 06/20/2022   Patient verbalizes how to adjust ply socks. Baseline: SEE OBJECTIVE DATA Goal status: SET 05/21/2022 2.  Patient tolerates prosthesis >90% awake hrs /day without skin issues or limb pain after standing. Baseline: SEE OBJECTIVE DATA Goal status: SET 05/21/2022   3. Berg Balance >45/56 Baseline: SEE OBJECTIVE DATA Goal status:  SET 05/21/2022   4. Patient ambulates 400' with prosthesis with supervision. Baseline: SEE OBJECTIVE DATA Goal status: SET 05/21/2022   5. Patient negotiates ramps & curbs with prosthesis with supervision Baseline: SEE OBJECTIVE DATA Goal status: SET 05/21/2022   LONG TERM GOALS: Target date: 07/31/2021   Patient demonstrates & verbalized understanding of prosthetic care  to enable safe utilization of prosthesis. Baseline: SEE OBJECTIVE DATA Goal status: INITIAL   Patient tolerates prosthesis wear >90% of awake hours without skin or limb pain issues. Baseline: SEE OBJECTIVE DATA Goal status: INITIAL   Functional Gait Assessment >22/30 to indicate lower fall risk Baseline: SEE OBJECTIVE DATA Goal  status: INITIAL   Patient ambulates >500' with prosthesis only independently Baseline: SEE OBJECTIVE DATA Goal status: INITIAL   Patient negotiates ramps, curbs & stairs with single rail with prosthesis only independently. Baseline: SEE OBJECTIVE DATA Goal status: INITIAL   Patient demonstrates & verbalizes lifting, carrying, pushing, pulling with prosthesis only safely. Baseline: SEE OBJECTIVE DATA Goal status: INITIAL     PLAN:   PT FREQUENCY: 3x/week for 5 weeks, then 1-2 x/wk for 10 weeks   PT DURATION: other: 15 weeks   PLANNED INTERVENTIONS: Therapeutic exercises, Therapeutic activity, Neuromuscular re-education, Balance training, Gait training, Patient/Family education, Self Care, Stair training, Vestibular training, Prosthetic training, DME instructions, and physical performance testing   PLAN FOR NEXT SESSION:  prosthetic gait with & without cane, balance activities working on [employer], PT, DPT 05/28/2022, 12:55 PM

## 2022-05-29 ENCOUNTER — Encounter: Payer: Self-pay | Admitting: Physical Therapy

## 2022-05-29 ENCOUNTER — Ambulatory Visit (INDEPENDENT_AMBULATORY_CARE_PROVIDER_SITE_OTHER): Payer: Self-pay | Admitting: Physical Therapy

## 2022-05-29 DIAGNOSIS — M6281 Muscle weakness (generalized): Secondary | ICD-10-CM

## 2022-05-29 DIAGNOSIS — R2681 Unsteadiness on feet: Secondary | ICD-10-CM

## 2022-05-29 DIAGNOSIS — S81801A Unspecified open wound, right lower leg, initial encounter: Secondary | ICD-10-CM

## 2022-05-29 DIAGNOSIS — R2689 Other abnormalities of gait and mobility: Secondary | ICD-10-CM

## 2022-06-02 ENCOUNTER — Ambulatory Visit (INDEPENDENT_AMBULATORY_CARE_PROVIDER_SITE_OTHER): Payer: Self-pay | Admitting: Physical Therapy

## 2022-06-02 ENCOUNTER — Ambulatory Visit: Payer: 59 | Admitting: Nurse Practitioner

## 2022-06-02 DIAGNOSIS — R2681 Unsteadiness on feet: Secondary | ICD-10-CM

## 2022-06-02 DIAGNOSIS — R2689 Other abnormalities of gait and mobility: Secondary | ICD-10-CM

## 2022-06-02 DIAGNOSIS — M6281 Muscle weakness (generalized): Secondary | ICD-10-CM

## 2022-06-02 NOTE — Therapy (Signed)
OUTPATIENT PHYSICAL THERAPY TREATMENT NOTE   Patient Name: Raymond [name] MRN: [id number] DOB:1961-03-07, 62 y.o., male Today's Date: 06/02/2022  PCP: [name], FNP  REFERRING PROVIDER: Newt Minion, MD       END OF SESSION:   PT End of Session - 06/02/22 0849     Visit Number 16    Number of Visits 25    Date for PT Re-Evaluation 08/01/22    Authorization Type AET[employer]    Authorization Time Period NO COPAY, 35 PT/OT VISITS Calendar Year Benefit Date 05/26/2021 - 05/25/2022    Authorization - Number of Visits 35    Progress Note Due on Visit 68    PT Start Time 0845    PT Stop Time 0925    PT Time Calculation (min) 40 min    Equipment Utilized During Treatment Gait belt    Activity Tolerance Patient tolerated treatment well    Behavior During Therapy WFL for tasks assessed/performed                       Past Medical History:  Diagnosis Date   Diabetes ([place])    Diabetes mellitus without complication ([place])    type 2   ESRD (end stage renal disease) ([place])    03/09/2015- patient had a kidney transplant   Hypertension    Peripheral vascular disease ([place])    Renal disorder    Renal insufficiency    Past Surgical History:  Procedure Laterality Date   ABDOMINAL AORTOGRAM W/LOWER EXTREMITY N/A 09/11/2021   Procedure: ABDOMINAL AORTOGRAM W/LOWER EXTREMITY;  Surgeon: Wellington Hampshire, MD;  Location: [facility];  Service: Cardiovascular;  Laterality: N/A;   AMPUTATION Right 09/25/2021   Procedure: RIGHT TRANSMETATARSAL AMPUTATION AND APPLY TISSUE GRAFT;  Surgeon: Newt Minion, MD;  Location: [facility] [place];  Service: Orthopedics;  Laterality: Right;   AMPUTATION Right 11/16/2021   Procedure: RIGHT AMPUTATION BELOW KNEE WITH WOUND VAC APPLICATION;  Surgeon: Newt Minion, MD;  Location: [place];  Service: Orthopedics;  Laterality: Right;   BONE BIOPSY Right 03/06/2021   Procedure: BONE BIOPSY;  Surgeon: Trula Slade, DPM;  Location: [facility];   Service: Podiatry;  Laterality: Right;   CENTRAL VENOUS CATHETER INSERTION Right 01/20/2020   Procedure: INSERTION CENTRAL LINE ADULT; Tunneled central line;  Surgeon: Virl Cagey, MD;  Location: [facility];  Service: General;  Laterality: Right;   COLONOSCOPY N/A 12/05/2014   PXT:GGYIRSWN external and internal hemorrhoid/mild diverticulosis/11 polyps removed   ESOPHAGOGASTRODUODENOSCOPY N/A 12/05/2014   IOE:VOJJ duodenitis   GRAFT APPLICATION Right 00/93/8182   Procedure: GRAFT APPLICATION;  Surgeon: Trula Slade, DPM;  Location: [facility];  Service: Podiatry;  Laterality: Right;   INCISION AND DRAINAGE OF WOUND Right 08/15/2021   Procedure: IRRIGATION AND DEBRIDEMENT WOUND;  Surgeon: Landis Martins, DPM;  Location: [facility];  Service: Podiatry;  Laterality: Right;  need to make card not  a irrigation and debridement card   IR FLUORO GUIDE CV LINE RIGHT  03/01/2019   IR PERC TUN PERIT CATH WO PORT S&I /IMAG  06/27/2021   IR REMOVAL TUN CV CATH W/O FL  05/10/2019   IR REMOVAL TUN CV CATH W/O FL  02/29/2020   IR REMOVAL TUN CV CATH W/O FL  07/19/2021   IR US GUIDE VASC ACCESS RIGHT  03/01/2019   IR US GUIDE VASC ACCESS RIGHT  06/27/2021   IRRIGATION AND DEBRIDEMENT ELBOW Left 06/24/2021   Procedure: IRRIGATION AND DEBRIDEMENT  ELBOW;  Surgeon: Lovell Sheehan, MD;  Location: [facility];  Service: Orthopedics;  Laterality: Left;   KIDNEY TRANSPLANT  03/27/2015   Penile Pump Insertion     PERIPHERAL VASCULAR ATHERECTOMY  09/11/2021   Procedure: PERIPHERAL VASCULAR ATHERECTOMY;  Surgeon: Wellington Hampshire, MD;  Location: [facility];  Service: Cardiovascular;;  Shockwave Lithotripsy   TEE WITHOUT CARDIOVERSION N/A 01/17/2020   Procedure: TRANSESOPHAGEAL ECHOCARDIOGRAM (TEE) WITH PROPOFOL;  Surgeon: Arnoldo Lenis, MD;  [facility];  Service: Endoscopy;  Laterality: N/A;   WOUND DEBRIDEMENT Right 03/06/2021   Procedure: DEBRIDEMENT WOUND;  Surgeon: Trula Slade, DPM;   Location[facility];  Service: Podiatry;  Laterality: Right;   Patient Active Problem List   Diagnosis Date Noted   Cutaneous abscess of right foot    Hyperkalemia 08/28/2021   Obesity (BMI 30-39.9) 08/28/2021   History of anemia due to chronic kidney disease 08/28/2021   Diabetic foot ulcer ([place]) 08/14/2021   Hypomagnesemia 06/25/2021   Septic arthritis of elbow, left ([place]) 06/24/2021   Peripheral vascular disease ([place]) 04/24/2020   Pyogenic inflammation of bone ([facility])    Thrombophlebitis of superficial veins of right lower extremity    Difficult intravenous access    MRSA bacteremia    Diabetic foot infection ([place])    Infected wound 01/12/2020   Critical lower limb ischemia ([place]) 02/02/2019   Personal history of noncompliance with medical treatment, presenting hazards to health 10/07/2018   Critical limb ischemia with history of revascularization of same extremity ([place]) 07/27/2018   Mixed hyperlipidemia 02/19/2018   History of renal transplant 02/19/2018   Conductive hearing loss, bilateral 08/31/2017   Nuclear sclerotic cataract of right eye 10/07/2016   Pseudophakia of left eye 10/07/2016   Hypertension due to endocrine disorder 03/04/2016   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 ([place]) 03/04/2016   Renal failure 03/04/2016   Proliferative diabetic retinopathy with macular edema associated with type 2 diabetes mellitus ([place]) 03/04/2016   Immunosuppression ([place]) 12/10/2015   Nausea vomiting and diarrhea 12/10/2015   Type 2 diabetes mellitus ([place]) 12/10/2015   Essential hypertension, benign 12/10/2015   Pancytopenia ([place]) 12/10/2015   Encounter for screening colonoscopy 11/15/2014   GERD (gastroesophageal reflux disease) 11/15/2014    REFERRING DIAG: Y18.563J (ICD-10-CM) - Below-knee amputation of right lower extremit   ONSET DATE: 04/10/2022 prosthesis delivery   THERAPY DIAG:  Unsteadiness on feet  Other  abnormalities of gait and mobility  Muscle weakness (generalized)  Rationale for Evaluation and Treatment Rehabilitation  PERTINENT HISTORY: DM2, PVD, ESRD s/p renal transplant 03/09/2015 on immunosuppressive meds, HTN   PRECAUTIONS: None  SUBJECTIVE:  SUBJECTIVE STATEMENT:  He sees prosthetist next week.      PAIN:  Are you having pain? Residual limb pain 0/10 soreness from wearing prosthesis without ability to seat limb into socket.   OBJECTIVE: (objective measures completed at initial evaluation unless otherwise dated) POSTURE: rounded shoulders, forward head, flexed trunk , and weight shift left   LOWER EXTREMITY ROM:   ROM P:passive  A:active Right eval Left eval  Hip flexion      Hip extension      Hip abduction      Hip adduction      Hip internal rotation      Hip external rotation      Knee flexion 100*    Knee extension -5*    Ankle dorsiflexion      Ankle plantarflexion      Ankle inversion      Ankle eversion       (Blank rows = not tested)   LOWER EXTREMITY MMT:   MMT Right eval Left eval  Hip flexion 3+/5    Hip extension 3-/5    Hip abduction 3-/5    Hip adduction      Hip internal rotation      Hip external rotation      Knee flexion 3-/5    Knee extension 3/5    Ankle dorsiflexion      Ankle plantarflexion      Ankle inversion      Ankle eversion      (Blank rows = not tested)   TRANSFERS: Sit to stand: 18" chair with armrests to RW with supervision.  Stand to sit: RW to 18" chair with armrests with supervision   PROSTHETIC GAIT WITH BKA PROSTHESIS: Gait pattern: step to pattern, decreased arm swing- Right, decreased step length- Right, decreased stance time- Left, decreased stride length, decreased hip/knee flexion- Right, circumduction- Right, Right hip hike,  knee flexed in stance- Right, antalgic, lateral hip instability, trunk flexed, and abducted- Right Distance walked: 43' with RW & 20' with cane Assistive device utilized: Single point cane, Walker - 2 wheeled, and BKA prosthesis Level of assistance: SBA with RW and Min A Gait velocity: 0.89 ft/sec Comments: excessive weight bearing on RW, partial weight on prosthesis   FUNCTIONAL TESTs:  05/12/2022:  Merrilee Jansky Balance test 42/56 TUG with prosthesis only: std 9.96sec & cog 10.94sec      04/15/2022: Timed up and go (TUG): 39.25sec with cane & BKA prosthesis Berg Balance Scale: 24/56     CURRENT PROSTHETIC WEAR ASSESSMENT: Patient is dependent with: skin check, residual limb care, care of non-amputated limb, prosthetic cleaning, ply sock cleaning, correct ply sock adjustment, proper wear schedule/adjustment, and proper weight-bearing schedule/adjustment Donning prosthesis: Min A Doffing prosthesis: SBA Prosthetic wear tolerance: 3 hours, 1-2x/day, 5 of 5 days since delivery Prosthetic weight bearing tolerance: 5 minutes Edema: pitting. Residual limb condition: 58m scab wound on medial incision, dry skin, normal color & temperature, cylindrical shape Prosthetic description: silicon liner with pin lock suspension, total contact socket, hydraulic ankle / K3 foot K code/activity level with prosthetic use: Level 3       TODAY'S TREATMENT:  DATE:  06/02/2022: Prosthetic Training with TTA prosthesis: Pre-gait in //bars in step position working on wt shift over prosthesis without hip or trunk collapse. Progressed to gait in//bars with carryover. Walking in bars without UE support retro and sideways 4 round trips Stepping over 2 black bolsters with only intermitt UE support PRN stepping over with Rt leg first Dynamic balance with gait: (gait belt and CGA with this)  walking with head turns 40 feet, head nods 40 feet, eyes closed 40 feet X 3 4 square balance X 5 CW and X 5 CCW (gait belt and CGA with this) Gait working on arm swing with counter trunk rotation with pt/PT holding pipes. Verbal cues for pelvic wt shift over prosthesis in stance. Red theraband 2X 10 each for bilat hip abduction and bilat hip extension with UE support Gait 300 feet with supervision working on carry over for arm swing and trunk control.   DATE:  05/29/2022: Prosthetic Training with TTA prosthesis: Pre-gait in //bars in step position working on wt shift over prosthesis without hip or trunk collapse. Progressed to gait in//bars with carryover. Gait working on arm swing with counter trunk rotation with pt/PT holding pipes. Verbal cues for pelvic wt shift over prosthesis in stance. Pelvic wt shift with green theraband resistance cues to maintain trunk without lateral lean. Progressed to tapping LLE on cone. Pelvic wt shift with right grazing counter with RUE on counter working on trunk upright without lateral lean - stepping LLE forward / back for RLE stance.  Red theraband steps with LLE for RLE stance without dipping or trunk lean LUE support 15 reps ea flex, add, ext & abd   05/28/2022: Prosthetic Training with TTA prosthesis: PT reviewed adjusting ply socks with recommendation to not try flexible inner socket unless using 10-ply or more. Pt  verbalized understanding.  Neuromuscular Re-ed: Standing in corner: eyes open on floor with feet together head turns right/left, up/down & 2 diagonals 5-8 reps ea;  eyes closed on floor with feet hip width head turns right/left, up/down & 2 diagonals 5-8 reps ea; eyes open on foam with feet hip width head turns right/left, up/down & 2 diagonals 5-8 reps ea;  Standing in corner - Step strategy facilitation - anticipatory stepping off towel roll forward, backward & sideways with BLEs 5 reps each.    05/22/2022: Prosthetic Training with TTA  prosthesis: Standing on ramp in step stance with UE support on window seal - stepping RLE forward  & backward 10 reps ea LE facing uphill & downhill.   Neuromuscular Re-ed: Tandem stance on floor at sink: counting to 30 (stop count when touching) RLE in front & in back. Pt verbalized understanding as HEP. Tandem gait along counter with single support 8' X 2. Pt verbalized understanding as HEP. SLS with one forefoot on lower shelf of cabinet- counting to 10 (stop count when touching) - 2 reps ea LE Pt verbalized understanding as HEP. Rocker board - square with 2 pivot points - in //bars with light BUE support: forward/level/back & right/level/left 1 min each. UE resistance to facilitate equal weight bearing LEs - blue theraband - alternating UEs & BUEs 10 reps ea. Row, forward reach and upward reach. Pt verbalized understanding as HEP.   HOME EXERCISE PROGRAM: Access Code: [facility] URL: [email or web address] Date: 05/22/2022 Prepared by: Jamey Reas  Exercises - Hip Flexor Stretch at [facility] of [facility]  - 1-3 x daily - 7 x weekly - 1 sets - 3 reps - 20-30 seconds hold -  Supine Quadriceps Stretch with Strap on Table  - 1-3 x daily - 7 x weekly - 1 sets - 3 reps - 20-30 seconds hold - Supine Bridge  - 1 x daily - 7 x weekly - 2-3 sets - 10 reps - 5 seconds hold - Marching Bridge  - 1 x daily - 7 x weekly - 2-3 sets - 10 reps - 5 seconds hold - Standing Terminal Knee Extension with Resistance  - 1 x daily - 7 x weekly - 2-3 sets - 10 reps - 5 seconds hold - Standing Hip Flexion with Resistance (Mirrored)  - 1 x daily - 7 x weekly - 1 sets - 10 reps - Standing Hip Adduction with Resistance (Mirrored)  - 1 x daily - 7 x weekly - 1 sets - 10 reps - Standing Hip Extension with Resistance  - 1 x daily - 7 x weekly - 1 sets - 10 reps - Standing Hip Abduction with Theraband Resistance  - 1 x daily - 7 x weekly - 1 sets - 10 reps - Standing Tandem Balance with Counter Support  - 1 x daily -  7 x weekly - 1 sets - 3-4 reps - 30 seconds hold - Tandem Walking with Counter Support  - 1 x daily - 7 x weekly - 1 sets - 6 reps - single leg stance with one foot in lower cabinet  - 1 x daily - 7 x weekly - 1 sets - 5-10 reps - 10 seconds hold - [place] with Counter Support  - 1 x daily - 7 x weekly - 1 sets - 10 reps - Alternating Punch with Resistance  - 1 x daily - 5 x weekly - 1 sets - 10 reps - 5 seconds hold - Standing Scapular Protraction with Resistance  - 1 x daily - 7 x weekly - 1 sets - 10 reps - 5 seconds hold - Standing alternate rows with resistance  - 1 x daily - 7 x weekly - 1 sets - 10 reps - 5 seconds hold - Standing Row with Anchored Resistance  - 1 x daily - 7 x weekly - 1 sets - 10 reps - 5 seconds hold - Alternating elbow flexion with resistance  - 1 x daily - 7 x weekly - 1 sets - 10 reps - 5 seconds hold - Standing Bicep Curls with Resistance  - 1 x daily - 7 x weekly - 1 sets - 10 reps - 5 seconds hold   Do each exercise 1-2  times per day Do each exercise 5-10 repetitions Hold each exercise for 2 seconds to feel your location  AT [place].  Try to find this position when standing still for activities.   USE TAPE ON FLOOR TO MARK THE MIDLINE POSITION which is even with middle of sink.  You also should try to feel with your limb pressure in socket.  You are trying to feel with limb what you used to feel with the bottom of your foot.  Side to Side Shift: Moving your hips only (not shoulders): move weight onto your left leg, HOLD/FEEL pressure in socket.  Move back to equal weight on each leg, HOLD/FEEL pressure in socket. Move weight onto your right leg, HOLD/FEEL pressure in socket. Move back to equal weight on each leg, HOLD/FEEL pressure in socket. Repeat.  Start with both hands on sink, progress to hand on prosthetic side  only, then no hands.  Front to Back Shift: Moving your hips only (not  shoulders): move your weight forward onto your toes, HOLD/FEEL pressure in socket. Move your weight back to equal Flat Foot on both legs, HOLD/FEEL  pressure in socket. Move your weight back onto your heels, HOLD/FEEL  pressure in socket. Move your weight back to equal on both legs, HOLD/FEEL  pressure in socket. Repeat.  Start with both hands on sink, progress to hand on prosthetic side only, then no hands.  Moving Cones / Cups: With equal weight on each leg: Hold on with one hand the first time, then progress to no hand supports. Move cups from one side of sink to the other. Place cups ~2" out of your reach, progress to 10" beyond reach.  Place one hand in middle of sink and reach with other hand. Do both arms.  Then hover one hand and move cups with other hand.  Overhead/Upward Reaching: alternated reaching up to top cabinets or ceiling if no cabinets present. Keep equal weight on each leg. Start with one hand support on counter while other hand reaches and progress to no hand support with reaching.  ace one hand in middle of sink and reach with other hand. Do both arms.  Then hover one hand and move cups with other hand.  5.   Looking Over Shoulders: With equal weight on each leg: alternate turning to look over your shoulders with one hand support on counter as needed.  Start with head motions only to look in front of shoulder, then even with shoulder and progress to looking behind you. To look to side, move head /eyes, then shoulder on side looking pulls back, shift more weight to side looking and pull hip back. Place one hand in middle of sink and let go with other hand so your shoulder can pull back. Switch hands to look other way.   Then hover one hand and look over shoulder. If looking right, use left hand at sink. If looking left, use right hand at sink. 6.  Stepping with leg that is not amputated:  Move items under cabinet out of your way. Shift your hips/pelvis so weight on prosthesis. Tighten  muscles in hip on prosthetic side.  SLOWLY step other leg so front of foot is in cabinet. Then step back to floor.      ASSESSMENT:   CLINICAL IMPRESSION: PT will follow up with prosthesis as it may be possible his prosthesis is too short contributing to some of his trunk lean with gait. We worked to improve this along with his overall dynamic balance with prosthetic gait today with good overall tolerance.  OBJECTIVE IMPAIRMENTS: Abnormal gait, decreased activity tolerance, decreased balance, decreased endurance, decreased knowledge of condition, decreased knowledge of use of DME, decreased mobility, decreased ROM, decreased strength, increased edema, impaired flexibility, postural dysfunction, prosthetic dependency , obesity, and wound on residual limb .    ACTIVITY LIMITATIONS: carrying, lifting, bending, standing, squatting, stairs, transfers, locomotion level, and prosthetic use   PARTICIPATION LIMITATIONS: meal prep, cleaning, laundry, driving, community activity, occupation, and yard work   PERSONAL FACTORS: Fitness, Time since onset of injury/illness/exacerbation, and 3+ comorbidities: see PMH  are also affecting patient's functional outcome.    REHAB POTENTIAL: Good   CLINICAL DECISION MAKING: Evolving/moderate complexity   EVALUATION COMPLEXITY: Moderate     GOALS: Goals reviewed with patient? Yes   SHORT TERM GOALS: Target date: 06/20/2022   Patient verbalizes how to adjust ply socks.  Baseline: SEE OBJECTIVE DATA Goal status: Ongoing 05/29/2022 2.  Patient tolerates prosthesis >90% awake hrs /day without skin issues or limb pain after standing. Baseline: SEE OBJECTIVE DATA Goal status: Ongoing 05/29/2022   3. Berg Balance >45/56 Baseline: SEE OBJECTIVE DATA Goal status:  Ongoing 05/29/2022   4. Patient ambulates 400' with prosthesis with supervision. Baseline: SEE OBJECTIVE DATA Goal status: Ongoing 05/29/2022   5. Patient negotiates ramps & curbs with prosthesis with  supervision Baseline: SEE OBJECTIVE DATA Goal status: Ongoing 05/29/2022   LONG TERM GOALS: Target date: 07/31/2021   Patient demonstrates & verbalized understanding of prosthetic care to enable safe utilization of prosthesis. Baseline: SEE OBJECTIVE DATA Goal status: Ongoing 05/29/2022   Patient tolerates prosthesis wear >90% of awake hours without skin or limb pain issues. Baseline: SEE OBJECTIVE DATA Goal status: Ongoing 05/29/2022   Functional Gait Assessment >22/30 to indicate lower fall risk Baseline: SEE OBJECTIVE DATA Goal status: Ongoing 05/29/2022   Patient ambulates >500' with prosthesis only independently Baseline: SEE OBJECTIVE DATA Goal status: Ongoing 05/29/2022   Patient negotiates ramps, curbs & stairs with single rail with prosthesis only independently. Baseline: SEE OBJECTIVE DATA Goal status: Ongoing 05/29/2022   Patient demonstrates & verbalizes lifting, carrying, pushing, pulling with prosthesis only safely. Baseline: SEE OBJECTIVE DATA Goal status:  Ongoing 05/29/2022     PLAN:   PT FREQUENCY: 3x/week for 5 weeks, then 1-2 x/wk for 10 weeks   PT DURATION: other: 15 weeks   PLANNED INTERVENTIONS: Therapeutic exercises, Therapeutic activity, Neuromuscular re-education, Balance training, Gait training, Patient/Family education, Self Care, Stair training, Vestibular training, Prosthetic training, DME instructions, and physical performance testing   PLAN FOR NEXT SESSION:   continue prosthetic gait without cane, balance activities working on [employer], PT, DPT 06/02/2022, 8:58 AM

## 2022-06-03 ENCOUNTER — Encounter: Payer: 59 | Admitting: Physical Therapy

## 2022-06-03 NOTE — Therapy (Incomplete)
OUTPATIENT PHYSICAL THERAPY TREATMENT NOTE   Patient Name: Raymond [name] MRN: [id number] DOB:[birth year], 62 y.o., male Today's Date: 06/03/2022  PCP: [name], FNP  REFERRING PROVIDER: Newt Minion, MD       END OF SESSION:               Past Medical History:  Diagnosis Date   Diabetes [facility])    Diabetes mellitus without complication ([place])    type 2   ESRD (end stage renal disease) ([place])    03/09/2015- patient had a kidney transplant   Hypertension    Peripheral vascular disease ([place])    Renal disorder    Renal insufficiency    Past Surgical History:  Procedure Laterality Date   ABDOMINAL AORTOGRAM W/LOWER EXTREMITY N/A 09/11/2021   Procedure: ABDOMINAL AORTOGRAM W/LOWER EXTREMITY;  Surgeon: Wellington Hampshire, MD;  Location: [place] [facility];  Service: Cardiovascular;  Laterality: N/A;   AMPUTATION Right 09/25/2021   Procedure: RIGHT TRANSMETATARSAL AMPUTATION AND APPLY TISSUE GRAFT;  Surgeon: Newt Minion, MD;  Location: [place];  Service: Orthopedics;  Laterality: Right;   AMPUTATION Right 11/16/2021   Procedure: RIGHT AMPUTATION BELOW KNEE WITH WOUND VAC APPLICATION;  Surgeon: Newt Minion, MD;  Location: [place];  Service: Orthopedics;  Laterality: Right;   BONE BIOPSY Right 03/06/2021   Procedure: BONE BIOPSY;  Surgeon: Trula Slade, DPM;  Location: [facility];  Service: Podiatry;  Laterality: Right;   CENTRAL VENOUS CATHETER INSERTION Right 01/20/2020   Procedure: INSERTION CENTRAL LINE ADULT; Tunneled central line;  Surgeon: Virl Cagey, MD;  Location: [facility];  Service: General;  Laterality: Right;   COLONOSCOPY N/A 12/05/2014   MPN:[id number] external and internal hemorrhoid/mild diverticulosis/11 polyps removed   ESOPHAGOGASTRODUODENOSCOPY N/A 12/05/2014   QMG:QQPY duodenitis   GRAFT APPLICATION Right 19/50/9326   Procedure: GRAFT APPLICATION;  Surgeon: Trula Slade, DPM;  [facility]: [facility];  Service: Podiatry;  Laterality:  Right;   INCISION AND DRAINAGE OF WOUND Right 08/15/2021   Procedure: IRRIGATION AND DEBRIDEMENT WOUND;  Surgeon: Landis Martins, DPM;  [facility];  Service: Podiatry;  Laterality: Right;  need to make card not  a irrigation and debridement card   IR FLUORO GUIDE CV LINE RIGHT  03/01/2019   IR PERC TUN PERIT CATH WO PORT S&I /IMAG  06/27/2021   IR REMOVAL TUN CV CATH W/O FL  05/10/2019   IR REMOVAL TUN CV CATH W/O FL  02/29/2020   IR REMOVAL TUN CV CATH W/O FL  07/19/2021   IR US GUIDE VASC ACCESS RIGHT  03/01/2019   IR US GUIDE VASC ACCESS RIGHT  06/27/2021   IRRIGATION AND DEBRIDEMENT ELBOW Left 06/24/2021   Procedure: IRRIGATION AND DEBRIDEMENT ELBOW;  Surgeon: Lovell Sheehan, MD;  Location: [facility];  Service: Orthopedics;  Laterality: Left;   KIDNEY TRANSPLANT  03/27/2015   Penile Pump Insertion     PERIPHERAL VASCULAR ATHERECTOMY  09/11/2021   Procedure: PERIPHERAL VASCULAR ATHERECTOMY;  Surgeon: Wellington Hampshire, MD;  Location: [place] [facility];  Service: Cardiovascular;;  Shockwave Lithotripsy   TEE WITHOUT CARDIOVERSION N/A 01/17/2020   Procedure: TRANSESOPHAGEAL ECHOCARDIOGRAM (TEE) WITH PROPOFOL;  Surgeon: Arnoldo Lenis, MD;  [facility];  Service: Endoscopy;  Laterality: N/A;   WOUND DEBRIDEMENT Right 03/06/2021   Procedure: DEBRIDEMENT WOUND;  Surgeon: Trula Slade, DPM;  [facility];  Service: Podiatry;  Laterality: Right;   Patient Active Problem List   Diagnosis Date Noted  Cutaneous abscess of right foot    Hyperkalemia 08/28/2021   Obesity (BMI 30-39.9) 08/28/2021   History of anemia due to chronic kidney disease 08/28/2021   Diabetic foot ulcer ([place]) 08/14/2021   Hypomagnesemia 06/25/2021   Septic arthritis of elbow, left ([place]) 06/24/2021   Peripheral vascular disease ([place]) 04/24/2020   Pyogenic inflammation of bone ([facility])    Thrombophlebitis of superficial veins of right lower extremity    Difficult intravenous access    MRSA  bacteremia    Diabetic foot infection ([place])    Infected wound 01/12/2020   Critical lower limb ischemia ([place]) 02/02/2019   Personal history of noncompliance with medical treatment, presenting hazards to health 10/07/2018   Critical limb ischemia with history of revascularization of same extremity ([place]) 07/27/2018   Mixed hyperlipidemia 02/19/2018   History of renal transplant 02/19/2018   Conductive hearing loss, bilateral 08/31/2017   Nuclear sclerotic cataract of right eye 10/07/2016   Pseudophakia of left eye 10/07/2016   Hypertension due to endocrine disorder 03/04/2016   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 ([place]) 03/04/2016   Renal failure 03/04/2016   Proliferative diabetic retinopathy with macular edema associated with type 2 diabetes mellitus ([place]) 03/04/2016   Immunosuppression ([place]) 12/10/2015   Nausea vomiting and diarrhea 12/10/2015   Type 2 diabetes mellitus ([place]) 12/10/2015   Essential hypertension, benign 12/10/2015   Pancytopenia ([place]) 12/10/2015   Encounter for screening colonoscopy 11/15/2014   GERD (gastroesophageal reflux disease) 11/15/2014    REFERRING DIAG: O27.035K (ICD-10-CM) - Below-knee amputation of right lower extremit   ONSET DATE: 04/10/2022 prosthesis delivery   THERAPY DIAG:  No diagnosis found.  Rationale for Evaluation and Treatment Rehabilitation  PERTINENT HISTORY: DM2, PVD, ESRD s/p renal transplant 03/09/2015 on immunosuppressive meds, HTN   PRECAUTIONS: None  SUBJECTIVE:                                                                                                                                                                                      SUBJECTIVE STATEMENT:  *** He sees prosthetist next week.      PAIN:  Are you having pain? Residual limb pain 0/10 soreness from wearing prosthesis without ability to seat limb into socket.   OBJECTIVE: (objective  measures completed at initial evaluation unless otherwise dated) POSTURE: rounded shoulders, forward head, flexed trunk , and weight shift left   LOWER EXTREMITY ROM:   ROM P:passive  A:active Right eval Left eval  Hip flexion      Hip extension      Hip abduction      Hip adduction  Hip internal rotation      Hip external rotation      Knee flexion 100*    Knee extension -5*    Ankle dorsiflexion      Ankle plantarflexion      Ankle inversion      Ankle eversion       (Blank rows = not tested)   LOWER EXTREMITY MMT:   MMT Right eval Left eval  Hip flexion 3+/5    Hip extension 3-/5    Hip abduction 3-/5    Hip adduction      Hip internal rotation      Hip external rotation      Knee flexion 3-/5    Knee extension 3/5    Ankle dorsiflexion      Ankle plantarflexion      Ankle inversion      Ankle eversion      (Blank rows = not tested)   TRANSFERS: Sit to stand: 18" chair with armrests to RW with supervision.  Stand to sit: RW to 18" chair with armrests with supervision   PROSTHETIC GAIT WITH BKA PROSTHESIS: Gait pattern: step to pattern, decreased arm swing- Right, decreased step length- Right, decreased stance time- Left, decreased stride length, decreased hip/knee flexion- Right, circumduction- Right, Right hip hike, knee flexed in stance- Right, antalgic, lateral hip instability, trunk flexed, and abducted- Right Distance walked: 56' with RW & 20' with cane Assistive device utilized: Single point cane, Walker - 2 wheeled, and BKA prosthesis Level of assistance: SBA with RW and Min A Gait velocity: 0.89 ft/sec Comments: excessive weight bearing on RW, partial weight on prosthesis   FUNCTIONAL TESTs:  05/12/2022:  Merrilee Jansky Balance test 42/56 TUG with prosthesis only: std 9.96sec & cog 10.94sec      04/15/2022: Timed up and go (TUG): 39.25sec with cane & BKA prosthesis Berg Balance Scale: 24/56     CURRENT PROSTHETIC WEAR ASSESSMENT: Patient is  dependent with: skin check, residual limb care, care of non-amputated limb, prosthetic cleaning, ply sock cleaning, correct ply sock adjustment, proper wear schedule/adjustment, and proper weight-bearing schedule/adjustment Donning prosthesis: Min A Doffing prosthesis: SBA Prosthetic wear tolerance: 3 hours, 1-2x/day, 5 of 5 days since delivery Prosthetic weight bearing tolerance: 5 minutes Edema: pitting. Residual limb condition: 41m scab wound on medial incision, dry skin, normal color & temperature, cylindrical shape Prosthetic description: silicon liner with pin lock suspension, total contact socket, hydraulic ankle / K3 foot K code/activity level with prosthetic use: Level 3       TODAY'S TREATMENT:                                                                                                                              DATE:  06/03/2022: ***  06/02/2022: Prosthetic Training with TTA prosthesis: Pre-gait in //bars in step position working on wt shift over prosthesis without hip or trunk collapse. Progressed to gait in//bars with carryover. Walking in bars without  UE support retro and sideways 4 round trips Stepping over 2 black bolsters with only intermitt UE support PRN stepping over with Rt leg first Dynamic balance with gait: (gait belt and CGA with this) walking with head turns 40 feet, head nods 40 feet, eyes closed 40 feet X 3 4 square balance X 5 CW and X 5 CCW (gait belt and CGA with this) Gait working on arm swing with counter trunk rotation with pt/PT holding pipes. Verbal cues for pelvic wt shift over prosthesis in stance. Red theraband 2X 10 each for bilat hip abduction and bilat hip extension with UE support Gait 300 feet with supervision working on carry over for arm swing and trunk control.   05/29/2022: Prosthetic Training with TTA prosthesis: Pre-gait in //bars in step position working on wt shift over prosthesis without hip or trunk collapse. Progressed to gait  in//bars with carryover. Gait working on arm swing with counter trunk rotation with pt/PT holding pipes. Verbal cues for pelvic wt shift over prosthesis in stance. Pelvic wt shift with green theraband resistance cues to maintain trunk without lateral lean. Progressed to tapping LLE on cone. Pelvic wt shift with right grazing counter with RUE on counter working on trunk upright without lateral lean - stepping LLE forward / back for RLE stance.  Red theraband steps with LLE for RLE stance without dipping or trunk lean LUE support 15 reps ea flex, add, ext & abd   HOME EXERCISE PROGRAM: Access Code: [employer] URL: [email or web address] Date: 05/22/2022 Prepared by: Jamey Reas  Exercises - Hip Flexor Stretch at [facility] of Bed  - 1-3 x daily - 7 x weekly - 1 sets - 3 reps - 20-30 seconds hold - Supine Quadriceps Stretch with Strap on Table  - 1-3 x daily - 7 x weekly - 1 sets - 3 reps - 20-30 seconds hold - Supine Bridge  - 1 x daily - 7 x weekly - 2-3 sets - 10 reps - 5 seconds hold - Marching Bridge  - 1 x daily - 7 x weekly - 2-3 sets - 10 reps - 5 seconds hold - Standing Terminal Knee Extension with Resistance  - 1 x daily - 7 x weekly - 2-3 sets - 10 reps - 5 seconds hold - Standing Hip Flexion with Resistance (Mirrored)  - 1 x daily - 7 x weekly - 1 sets - 10 reps - Standing Hip Adduction with Resistance (Mirrored)  - 1 x daily - 7 x weekly - 1 sets - 10 reps - Standing Hip Extension with Resistance  - 1 x daily - 7 x weekly - 1 sets - 10 reps - Standing Hip Abduction with Theraband Resistance  - 1 x daily - 7 x weekly - 1 sets - 10 reps - Standing Tandem Balance with Counter Support  - 1 x daily - 7 x weekly - 1 sets - 3-4 reps - 30 seconds hold - Tandem Walking with Counter Support  - 1 x daily - 7 x weekly - 1 sets - 6 reps - single leg stance with one foot in lower cabinet  - 1 x daily - 7 x weekly - 1 sets - 5-10 reps - 10 seconds hold - Carioca with Counter Support  - 1  x daily - 7 x weekly - 1 sets - 10 reps - Alternating Punch with Resistance  - 1 x daily - 5 x weekly - 1 sets - 10 reps - 5 seconds hold - Standing Scapular  Protraction with Resistance  - 1 x daily - 7 x weekly - 1 sets - 10 reps - 5 seconds hold - Standing alternate rows with resistance  - 1 x daily - 7 x weekly - 1 sets - 10 reps - 5 seconds hold - Standing Row with Anchored Resistance  - 1 x daily - 7 x weekly - 1 sets - 10 reps - 5 seconds hold - Alternating elbow flexion with resistance  - 1 x daily - 7 x weekly - 1 sets - 10 reps - 5 seconds hold - Standing Bicep Curls with Resistance  - 1 x daily - 7 x weekly - 1 sets - 10 reps - 5 seconds hold   Do each exercise 1-2  times per day Do each exercise 5-10 repetitions Hold each exercise for 2 seconds to feel your location  AT [place].  Try to find this position when standing still for activities.   USE TAPE ON FLOOR TO MARK THE MIDLINE POSITION which is even with middle of sink.  You also should try to feel with your limb pressure in socket.  You are trying to feel with limb what you used to feel with the bottom of your foot.  Side to Side Shift: Moving your hips only (not shoulders): move weight onto your left leg, HOLD/FEEL pressure in socket.  Move back to equal weight on each leg, HOLD/FEEL pressure in socket. Move weight onto your right leg, HOLD/FEEL pressure in socket. Move back to equal weight on each leg, HOLD/FEEL pressure in socket. Repeat.  Start with both hands on sink, progress to hand on prosthetic side only, then no hands.  Front to Back Shift: Moving your hips only (not shoulders): move your weight forward onto your toes, HOLD/FEEL pressure in socket. Move your weight back to equal Flat Foot on both legs, HOLD/FEEL  pressure in socket. Move your weight back onto your heels, HOLD/FEEL  pressure in socket. Move your weight back to equal on both legs,  HOLD/FEEL  pressure in socket. Repeat.  Start with both hands on sink, progress to hand on prosthetic side only, then no hands.  Moving Cones / Cups: With equal weight on each leg: Hold on with one hand the first time, then progress to no hand supports. Move cups from one side of sink to the other. Place cups ~2" out of your reach, progress to 10" beyond reach.  Place one hand in middle of sink and reach with other hand. Do both arms.  Then hover one hand and move cups with other hand.  Overhead/Upward Reaching: alternated reaching up to top cabinets or ceiling if no cabinets present. Keep equal weight on each leg. Start with one hand support on counter while other hand reaches and progress to no hand support with reaching.  ace one hand in middle of sink and reach with other hand. Do both arms.  Then hover one hand and move cups with other hand.  5.   Looking Over Shoulders: With equal weight on each leg: alternate turning to look over your shoulders with one hand support on counter as needed.  Start with head motions only to look in front of shoulder, then even with shoulder and progress to looking behind you. To look to side, move head /eyes, then shoulder on side looking pulls back, shift more weight to side looking and pull hip back. Place one hand in middle  of sink and let go with other hand so your shoulder can pull back. Switch hands to look other way.   Then hover one hand and look over shoulder. If looking right, use left hand at sink. If looking left, use right hand at sink. 6.  Stepping with leg that is not amputated:  Move items under cabinet out of your way. Shift your hips/pelvis so weight on prosthesis. Tighten muscles in hip on prosthetic side.  SLOWLY step other leg so front of foot is in cabinet. Then step back to floor.      ASSESSMENT:   CLINICAL IMPRESSION: ***  PT will follow up with prosthesis as it may be possible his prosthesis is too short contributing to some of his trunk lean  with gait. We worked to improve this along with his overall dynamic balance with prosthetic gait today with good overall tolerance.  OBJECTIVE IMPAIRMENTS: Abnormal gait, decreased activity tolerance, decreased balance, decreased endurance, decreased knowledge of condition, decreased knowledge of use of DME, decreased mobility, decreased ROM, decreased strength, increased edema, impaired flexibility, postural dysfunction, prosthetic dependency , obesity, and wound on residual limb .    ACTIVITY LIMITATIONS: carrying, lifting, bending, standing, squatting, stairs, transfers, locomotion level, and prosthetic use   PARTICIPATION LIMITATIONS: meal prep, cleaning, laundry, driving, community activity, occupation, and yard work   PERSONAL FACTORS: Fitness, Time since onset of injury/illness/exacerbation, and 3+ comorbidities: see PMH  are also affecting patient's functional outcome.    REHAB POTENTIAL: Good   CLINICAL DECISION MAKING: Evolving/moderate complexity   EVALUATION COMPLEXITY: Moderate     GOALS: Goals reviewed with patient? Yes   SHORT TERM GOALS: Target date: 06/20/2022   Patient verbalizes how to adjust ply socks. Baseline: SEE OBJECTIVE DATA Goal status: Ongoing 05/29/2022 2.  Patient tolerates prosthesis >90% awake hrs /day without skin issues or limb pain after standing. Baseline: SEE OBJECTIVE DATA Goal status: Ongoing 05/29/2022   3. Berg Balance >45/56 Baseline: SEE OBJECTIVE DATA Goal status:  Ongoing 05/29/2022   4. Patient ambulates 400' with prosthesis with supervision. Baseline: SEE OBJECTIVE DATA Goal status: Ongoing 05/29/2022   5. Patient negotiates ramps & curbs with prosthesis with supervision Baseline: SEE OBJECTIVE DATA Goal status: Ongoing 05/29/2022   LONG TERM GOALS: Target date: 07/31/2021   Patient demonstrates & verbalized understanding of prosthetic care to enable safe utilization of prosthesis. Baseline: SEE OBJECTIVE DATA Goal status: Ongoing  05/29/2022   Patient tolerates prosthesis wear >90% of awake hours without skin or limb pain issues. Baseline: SEE OBJECTIVE DATA Goal status: Ongoing 05/29/2022   Functional Gait Assessment >22/30 to indicate lower fall risk Baseline: SEE OBJECTIVE DATA Goal status: Ongoing 05/29/2022   Patient ambulates >500' with prosthesis only independently Baseline: SEE OBJECTIVE DATA Goal status: Ongoing 05/29/2022   Patient negotiates ramps, curbs & stairs with single rail with prosthesis only independently. Baseline: SEE OBJECTIVE DATA Goal status: Ongoing 05/29/2022   Patient demonstrates & verbalizes lifting, carrying, pushing, pulling with prosthesis only safely. Baseline: SEE OBJECTIVE DATA Goal status:  Ongoing 05/29/2022     PLAN:   PT FREQUENCY: 3x/week for 5 weeks, then 1-2 x/wk for 10 weeks   PT DURATION: other: 15 weeks   PLANNED INTERVENTIONS: Therapeutic exercises, Therapeutic activity, Neuromuscular re-education, Balance training, Gait training, Patient/Family education, Self Care, Stair training, Vestibular training, Prosthetic training, DME instructions, and physical performance testing   PLAN FOR NEXT SESSION:   *** continue prosthetic gait without cane, balance activities working on [employer], PT,  DPT 06/03/2022, 7:44 AM

## 2022-06-05 ENCOUNTER — Encounter: Payer: 59 | Admitting: Physical Therapy

## 2022-06-10 ENCOUNTER — Encounter: Payer: 59 | Admitting: Physical Therapy

## 2022-06-10 NOTE — Therapy (Incomplete)
OUTPATIENT PHYSICAL THERAPY TREATMENT NOTE   Patient Name: Raymond [name] MRN: [id number] DOB:06/05/1960, 62 y.o., male Today's Date: 06/10/2022  PCP: [name], FNP  REFERRING PROVIDER: Newt Minion, MD       END OF SESSION:               Past Medical History:  Diagnosis Date   Diabetes ([place])    Diabetes mellitus without complication ([place])    type 2   ESRD (end stage renal disease) ([place])    03/09/2015- patient had a kidney transplant   Hypertension    Peripheral vascular disease ([place])    Renal disorder    Renal insufficiency    Past Surgical History:  Procedure Laterality Date   ABDOMINAL AORTOGRAM W/LOWER EXTREMITY N/A 09/11/2021   Procedure: ABDOMINAL AORTOGRAM W/LOWER EXTREMITY;  Surgeon: Wellington Hampshire, MD;  Location: [facility];  Service: Cardiovascular;  Laterality: N/A;   AMPUTATION Right 09/25/2021   Procedure: RIGHT TRANSMETATARSAL AMPUTATION AND APPLY TISSUE GRAFT;  Surgeon: Newt Minion, MD;  Location: [facility];  Service: Orthopedics;  Laterality: Right;   AMPUTATION Right 11/16/2021   Procedure: RIGHT AMPUTATION BELOW KNEE WITH WOUND VAC APPLICATION;  Surgeon: Newt Minion, MD;  Location: [facility];  Service: Orthopedics;  Laterality: Right;   BONE BIOPSY Right 03/06/2021   Procedure: BONE BIOPSY;  Surgeon: Trula Slade, DPM;  Location: [facility];  Service: Podiatry;  Laterality: Right;   CENTRAL VENOUS CATHETER INSERTION Right 01/20/2020   Procedure: INSERTION CENTRAL LINE ADULT; Tunneled central line;  Surgeon: Virl Cagey, MD;  Location: [facility];  Service: General;  Laterality: Right;   COLONOSCOPY N/A 12/05/2014   TLX:BWIOMBTD external and internal hemorrhoid/mild diverticulosis/11 polyps removed   ESOPHAGOGASTRODUODENOSCOPY N/A 12/05/2014   HRC:BULA duodenitis   GRAFT APPLICATION Right 45/36/4680   Procedure: GRAFT APPLICATION;  Surgeon: Trula Slade, DPM;  Location: [facility];  Service: Podiatry;  Laterality:  Right;   INCISION AND DRAINAGE OF WOUND Right 08/15/2021   Procedure: IRRIGATION AND DEBRIDEMENT WOUND;  Surgeon: Landis Martins, DPM;  Location: [facility];  Service: Podiatry;  Laterality: Right;  need to make card not  a irrigation and debridement card   IR FLUORO GUIDE CV LINE RIGHT  03/01/2019   IR PERC TUN PERIT CATH WO PORT S&I /IMAG  06/27/2021   IR REMOVAL TUN CV CATH W/O FL  05/10/2019   IR REMOVAL TUN CV CATH W/O FL  02/29/2020   IR REMOVAL TUN CV CATH W/O FL  07/19/2021   IR US GUIDE VASC ACCESS RIGHT  03/01/2019   IR US GUIDE VASC ACCESS RIGHT  06/27/2021   IRRIGATION AND DEBRIDEMENT ELBOW Left 06/24/2021   Procedure: IRRIGATION AND DEBRIDEMENT ELBOW;  Surgeon: Lovell Sheehan, MD;  Location: [facility];  Service: Orthopedics;  Laterality: Left;   KIDNEY TRANSPLANT  03/27/2015   Penile Pump Insertion     PERIPHERAL VASCULAR ATHERECTOMY  09/11/2021   Procedure: PERIPHERAL VASCULAR ATHERECTOMY;  Surgeon: Wellington Hampshire, MD;  Location: [facility];  Service: Cardiovascular;;  [facility] Lithotripsy   TEE WITHOUT CARDIOVERSION N/A 01/17/2020   Procedure: TRANSESOPHAGEAL ECHOCARDIOGRAM (TEE) WITH PROPOFOL;  Surgeon: Arnoldo Lenis, MD;  [facility];  Service: Endoscopy;  Laterality: N/A;   WOUND DEBRIDEMENT Right 03/06/2021   Procedure: DEBRIDEMENT WOUND;  Surgeon: Trula Slade, DPM;  Location: [facility];  Service: Podiatry;  Laterality: Right;   Patient Active Problem List   Diagnosis Date Noted  Cutaneous abscess of right foot    Hyperkalemia 08/28/2021   Obesity (BMI 30-39.9) 08/28/2021   History of anemia due to chronic kidney disease 08/28/2021   Diabetic foot ulcer ([place]) 08/14/2021   Hypomagnesemia 06/25/2021   Septic arthritis of elbow, left ([place]) 06/24/2021   Peripheral vascular disease ([place]) 04/24/2020   Pyogenic inflammation of bone ([facility])    Thrombophlebitis of superficial veins of right lower extremity    Difficult intravenous access    MRSA  bacteremia    Diabetic foot infection ([place])    Infected wound 01/12/2020   Critical lower limb ischemia ([place]) 02/02/2019   Personal history of noncompliance with medical treatment, presenting hazards to health 10/07/2018   Critical limb ischemia with history of revascularization of same extremity ([place]) 07/27/2018   Mixed hyperlipidemia 02/19/2018   History of renal transplant 02/19/2018   Conductive hearing loss, bilateral 08/31/2017   Nuclear sclerotic cataract of right eye 10/07/2016   Pseudophakia of left eye 10/07/2016   Hypertension due to endocrine disorder 03/04/2016   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 ([place]) 03/04/2016   Renal failure 03/04/2016   Proliferative diabetic retinopathy with macular edema associated with type 2 diabetes mellitus ([place]) 03/04/2016   Immunosuppression ([place]) 12/10/2015   Nausea vomiting and diarrhea 12/10/2015   Type 2 diabetes mellitus ([place]) 12/10/2015   Essential hypertension, benign 12/10/2015   Pancytopenia ([place]) 12/10/2015   Encounter for screening colonoscopy 11/15/2014   GERD (gastroesophageal reflux disease) 11/15/2014    REFERRING DIAG: Q75.916B (ICD-10-CM) - Below-knee amputation of right lower extremit   ONSET DATE: 04/10/2022 prosthesis delivery   THERAPY DIAG:  No diagnosis found.  Rationale for Evaluation and Treatment Rehabilitation  PERTINENT HISTORY: DM2, PVD, ESRD s/p renal transplant 03/09/2015 on immunosuppressive meds, HTN   PRECAUTIONS: None  SUBJECTIVE:                                                                                                                                                                                      SUBJECTIVE STATEMENT:  *** He sees prosthetist next week.      PAIN:  Are you having pain? Residual limb pain 0/10 soreness from wearing prosthesis without ability to seat limb into socket.   OBJECTIVE: (objective  measures completed at initial evaluation unless otherwise dated) POSTURE: rounded shoulders, forward head, flexed trunk , and weight shift left   LOWER EXTREMITY ROM:   ROM P:passive  A:active Right eval Left eval  Hip flexion      Hip extension      Hip abduction      Hip adduction  Hip internal rotation      Hip external rotation      Knee flexion 100*    Knee extension -5*    Ankle dorsiflexion      Ankle plantarflexion      Ankle inversion      Ankle eversion       (Blank rows = not tested)   LOWER EXTREMITY MMT:   MMT Right eval Left eval  Hip flexion 3+/5    Hip extension 3-/5    Hip abduction 3-/5    Hip adduction      Hip internal rotation      Hip external rotation      Knee flexion 3-/5    Knee extension 3/5    Ankle dorsiflexion      Ankle plantarflexion      Ankle inversion      Ankle eversion      (Blank rows = not tested)   TRANSFERS: Sit to stand: 18" chair with armrests to RW with supervision.  Stand to sit: RW to 18" chair with armrests with supervision   PROSTHETIC GAIT WITH BKA PROSTHESIS: Gait pattern: step to pattern, decreased arm swing- Right, decreased step length- Right, decreased stance time- Left, decreased stride length, decreased hip/knee flexion- Right, circumduction- Right, Right hip hike, knee flexed in stance- Right, antalgic, lateral hip instability, trunk flexed, and abducted- Right Distance walked: 31' with RW & 20' with cane Assistive device utilized: Single point cane, Walker - 2 wheeled, and BKA prosthesis Level of assistance: SBA with RW and Min A Gait velocity: 0.89 ft/sec Comments: excessive weight bearing on RW, partial weight on prosthesis   FUNCTIONAL TESTs:  05/12/2022:  Merrilee Jansky Balance test 42/56 TUG with prosthesis only: std 9.96sec & cog 10.94sec      04/15/2022: Timed up and go (TUG): 39.25sec with cane & BKA prosthesis Berg Balance Scale: 24/56     CURRENT PROSTHETIC WEAR ASSESSMENT: Patient is  dependent with: skin check, residual limb care, care of non-amputated limb, prosthetic cleaning, ply sock cleaning, correct ply sock adjustment, proper wear schedule/adjustment, and proper weight-bearing schedule/adjustment Donning prosthesis: Min A Doffing prosthesis: SBA Prosthetic wear tolerance: 3 hours, 1-2x/day, 5 of 5 days since delivery Prosthetic weight bearing tolerance: 5 minutes Edema: pitting. Residual limb condition: 50m scab wound on medial incision, dry skin, normal color & temperature, cylindrical shape Prosthetic description: silicon liner with pin lock suspension, total contact socket, hydraulic ankle / K3 foot K code/activity level with prosthetic use: Level 3       TODAY'S TREATMENT:                                                                                                                              DATE:  06/10/2022: ***  06/02/2022: Prosthetic Training with TTA prosthesis: Pre-gait in //bars in step position working on wt shift over prosthesis without hip or trunk collapse. Progressed to gait in//bars with carryover. Walking in bars without  UE support retro and sideways 4 round trips Stepping over 2 black bolsters with only intermitt UE support PRN stepping over with Rt leg first Dynamic balance with gait: (gait belt and CGA with this) walking with head turns 40 feet, head nods 40 feet, eyes closed 40 feet X 3 4 square balance X 5 CW and X 5 CCW (gait belt and CGA with this) Gait working on arm swing with counter trunk rotation with pt/PT holding pipes. Verbal cues for pelvic wt shift over prosthesis in stance. Red theraband 2X 10 each for bilat hip abduction and bilat hip extension with UE support Gait 300 feet with supervision working on carry over for arm swing and trunk control.   05/29/2022: Prosthetic Training with TTA prosthesis: Pre-gait in //bars in step position working on wt shift over prosthesis without hip or trunk collapse. Progressed to gait  in//bars with carryover. Gait working on arm swing with counter trunk rotation with pt/PT holding pipes. Verbal cues for pelvic wt shift over prosthesis in stance. Pelvic wt shift with green theraband resistance cues to maintain trunk without lateral lean. Progressed to tapping LLE on cone. Pelvic wt shift with right grazing counter with RUE on counter working on trunk upright without lateral lean - stepping LLE forward / back for RLE stance.  Red theraband steps with LLE for RLE stance without dipping or trunk lean LUE support 15 reps ea flex, add, ext & abd   HOME EXERCISE PROGRAM: Access Code: [facility] URL: [email or web address] Date: 05/22/2022 Prepared by: Jamey Reas  Exercises - Hip Flexor Stretch at [facility] of Bed  - 1-3 x daily - 7 x weekly - 1 sets - 3 reps - 20-30 seconds hold - Supine Quadriceps Stretch with Strap on Table  - 1-3 x daily - 7 x weekly - 1 sets - 3 reps - 20-30 seconds hold - Supine Bridge  - 1 x daily - 7 x weekly - 2-3 sets - 10 reps - 5 seconds hold - Marching Bridge  - 1 x daily - 7 x weekly - 2-3 sets - 10 reps - 5 seconds hold - Standing Terminal Knee Extension with Resistance  - 1 x daily - 7 x weekly - 2-3 sets - 10 reps - 5 seconds hold - Standing Hip Flexion with Resistance (Mirrored)  - 1 x daily - 7 x weekly - 1 sets - 10 reps - Standing Hip Adduction with Resistance (Mirrored)  - 1 x daily - 7 x weekly - 1 sets - 10 reps - Standing Hip Extension with Resistance  - 1 x daily - 7 x weekly - 1 sets - 10 reps - Standing Hip Abduction with Theraband Resistance  - 1 x daily - 7 x weekly - 1 sets - 10 reps - Standing Tandem Balance with Counter Support  - 1 x daily - 7 x weekly - 1 sets - 3-4 reps - 30 seconds hold - Tandem Walking with Counter Support  - 1 x daily - 7 x weekly - 1 sets - 6 reps - single leg stance with one foot in lower cabinet  - 1 x daily - 7 x weekly - 1 sets - 5-10 reps - 10 seconds hold - Carioca with Counter Support  - 1  x daily - 7 x weekly - 1 sets - 10 reps - Alternating Punch with Resistance  - 1 x daily - 5 x weekly - 1 sets - 10 reps - 5 seconds hold - Standing Scapular  Protraction with Resistance  - 1 x daily - 7 x weekly - 1 sets - 10 reps - 5 seconds hold - Standing alternate rows with resistance  - 1 x daily - 7 x weekly - 1 sets - 10 reps - 5 seconds hold - Standing Row with Anchored Resistance  - 1 x daily - 7 x weekly - 1 sets - 10 reps - 5 seconds hold - Alternating elbow flexion with resistance  - 1 x daily - 7 x weekly - 1 sets - 10 reps - 5 seconds hold - Standing Bicep Curls with Resistance  - 1 x daily - 7 x weekly - 1 sets - 10 reps - 5 seconds hold   Do each exercise 1-2  times per day Do each exercise 5-10 repetitions Hold each exercise for 2 seconds to feel your location  AT [place].  Try to find this position when standing still for activities.   USE TAPE ON FLOOR TO MARK THE MIDLINE POSITION which is even with middle of sink.  You also should try to feel with your limb pressure in socket.  You are trying to feel with limb what you used to feel with the bottom of your foot.  Side to Side Shift: Moving your hips only (not shoulders): move weight onto your left leg, HOLD/FEEL pressure in socket.  Move back to equal weight on each leg, HOLD/FEEL pressure in socket. Move weight onto your right leg, HOLD/FEEL pressure in socket. Move back to equal weight on each leg, HOLD/FEEL pressure in socket. Repeat.  Start with both hands on sink, progress to hand on prosthetic side only, then no hands.  Front to Back Shift: Moving your hips only (not shoulders): move your weight forward onto your toes, HOLD/FEEL pressure in socket. Move your weight back to equal Flat Foot on both legs, HOLD/FEEL  pressure in socket. Move your weight back onto your heels, HOLD/FEEL  pressure in socket. Move your weight back to equal on both legs,  HOLD/FEEL  pressure in socket. Repeat.  Start with both hands on sink, progress to hand on prosthetic side only, then no hands.  Moving Cones / Cups: With equal weight on each leg: Hold on with one hand the first time, then progress to no hand supports. Move cups from one side of sink to the other. Place cups ~2" out of your reach, progress to 10" beyond reach.  Place one hand in middle of sink and reach with other hand. Do both arms.  Then hover one hand and move cups with other hand.  Overhead/Upward Reaching: alternated reaching up to top cabinets or ceiling if no cabinets present. Keep equal weight on each leg. Start with one hand support on counter while other hand reaches and progress to no hand support with reaching.  ace one hand in middle of sink and reach with other hand. Do both arms.  Then hover one hand and move cups with other hand.  5.   Looking Over Shoulders: With equal weight on each leg: alternate turning to look over your shoulders with one hand support on counter as needed.  Start with head motions only to look in front of shoulder, then even with shoulder and progress to looking behind you. To look to side, move head /eyes, then shoulder on side looking pulls back, shift more weight to side looking and pull hip back. Place one hand in middle  of sink and let go with other hand so your shoulder can pull back. Switch hands to look other way.   Then hover one hand and look over shoulder. If looking right, use left hand at sink. If looking left, use right hand at sink. 6.  Stepping with leg that is not amputated:  Move items under cabinet out of your way. Shift your hips/pelvis so weight on prosthesis. Tighten muscles in hip on prosthetic side.  SLOWLY step other leg so front of foot is in cabinet. Then step back to floor.      ASSESSMENT:   CLINICAL IMPRESSION: ***  PT will follow up with prosthesis as it may be possible his prosthesis is too short contributing to some of his trunk lean  with gait. We worked to improve this along with his overall dynamic balance with prosthetic gait today with good overall tolerance.  OBJECTIVE IMPAIRMENTS: Abnormal gait, decreased activity tolerance, decreased balance, decreased endurance, decreased knowledge of condition, decreased knowledge of use of DME, decreased mobility, decreased ROM, decreased strength, increased edema, impaired flexibility, postural dysfunction, prosthetic dependency , obesity, and wound on residual limb .    ACTIVITY LIMITATIONS: carrying, lifting, bending, standing, squatting, stairs, transfers, locomotion level, and prosthetic use   PARTICIPATION LIMITATIONS: meal prep, cleaning, laundry, driving, community activity, occupation, and yard work   PERSONAL FACTORS: Fitness, Time since onset of injury/illness/exacerbation, and 3+ comorbidities: see PMH  are also affecting patient's functional outcome.    REHAB POTENTIAL: Good   CLINICAL DECISION MAKING: Evolving/moderate complexity   EVALUATION COMPLEXITY: Moderate     GOALS: Goals reviewed with patient? Yes   SHORT TERM GOALS: Target date: 06/20/2022   Patient verbalizes how to adjust ply socks. Baseline: SEE OBJECTIVE DATA Goal status: Ongoing 05/29/2022 2.  Patient tolerates prosthesis >90% awake hrs /day without skin issues or limb pain after standing. Baseline: SEE OBJECTIVE DATA Goal status: Ongoing 05/29/2022   3. Berg Balance >45/56 Baseline: SEE OBJECTIVE DATA Goal status:  Ongoing 05/29/2022   4. Patient ambulates 400' with prosthesis with supervision. Baseline: SEE OBJECTIVE DATA Goal status: Ongoing 05/29/2022   5. Patient negotiates ramps & curbs with prosthesis with supervision Baseline: SEE OBJECTIVE DATA Goal status: Ongoing 05/29/2022   LONG TERM GOALS: Target date: 07/31/2021   Patient demonstrates & verbalized understanding of prosthetic care to enable safe utilization of prosthesis. Baseline: SEE OBJECTIVE DATA Goal status: Ongoing  05/29/2022   Patient tolerates prosthesis wear >90% of awake hours without skin or limb pain issues. Baseline: SEE OBJECTIVE DATA Goal status: Ongoing 05/29/2022   Functional Gait Assessment >22/30 to indicate lower fall risk Baseline: SEE OBJECTIVE DATA Goal status: Ongoing 05/29/2022   Patient ambulates >500' with prosthesis only independently Baseline: SEE OBJECTIVE DATA Goal status: Ongoing 05/29/2022   Patient negotiates ramps, curbs & stairs with single rail with prosthesis only independently. Baseline: SEE OBJECTIVE DATA Goal status: Ongoing 05/29/2022   Patient demonstrates & verbalizes lifting, carrying, pushing, pulling with prosthesis only safely. Baseline: SEE OBJECTIVE DATA Goal status:  Ongoing 05/29/2022     PLAN:   PT FREQUENCY: 3x/week for 5 weeks, then 1-2 x/wk for 10 weeks   PT DURATION: other: 15 weeks   PLANNED INTERVENTIONS: Therapeutic exercises, Therapeutic activity, Neuromuscular re-education, Balance training, Gait training, Patient/Family education, Self Care, Stair training, Vestibular training, Prosthetic training, DME instructions, and physical performance testing   PLAN FOR NEXT SESSION:   *** continue prosthetic gait without cane, balance activities working on [employer], PT,  DPT 06/10/2022, 7:31 AM

## 2022-06-12 ENCOUNTER — Encounter: Payer: 59 | Admitting: Physical Therapy

## 2022-06-17 ENCOUNTER — Encounter: Payer: Self-pay | Admitting: Physical Therapy

## 2022-06-17 ENCOUNTER — Ambulatory Visit (INDEPENDENT_AMBULATORY_CARE_PROVIDER_SITE_OTHER): Payer: Self-pay | Admitting: Physical Therapy

## 2022-06-17 DIAGNOSIS — R2689 Other abnormalities of gait and mobility: Secondary | ICD-10-CM

## 2022-06-17 DIAGNOSIS — M6281 Muscle weakness (generalized): Secondary | ICD-10-CM

## 2022-06-17 DIAGNOSIS — R2681 Unsteadiness on feet: Secondary | ICD-10-CM

## 2022-06-17 DIAGNOSIS — S81801A Unspecified open wound, right lower leg, initial encounter: Secondary | ICD-10-CM

## 2022-06-17 NOTE — Therapy (Signed)
OUTPATIENT PHYSICAL THERAPY TREATMENT NOTE   Patient Name: Raymond [name] MRN: [id number] DOB:[birth year], 62 y.o., male Today's Date: 06/17/2022  PCP: [name], FNP  REFERRING PROVIDER: Newt Minion, MD       END OF SESSION:   PT End of Session - 06/17/22 0813     Visit Number 17    Number of Visits 25    Date for PT Re-Evaluation 08/01/22    Authorization Type [employer]ET[employer]    Authorization Time Period NO COPAY, 35 PT/OT VISITS Calendar Year Benefit Date 05/26/2021 - 05/25/2022    Authorization - Number of Visits 35    Progress Note Due on Visit 46    PT Start Time 0811    PT Stop Time 0849    PT Time Calculation (min) 38 min    Equipment Utilized During Treatment Gait belt    Activity Tolerance Patient tolerated treatment well    Behavior During Therapy WFL for tasks assessed/performed                        Past Medical History:  Diagnosis Date   Diabetes ([place])    Diabetes mellitus without complication ([place])    type 2   ESRD (end stage renal disease) ([place])    03/09/2015- patient had a kidney transplant   Hypertension    Peripheral vascular disease ([place])    Renal disorder    Renal insufficiency    Past Surgical History:  Procedure Laterality Date   ABDOMINAL AORTOGRAM W/LOWER EXTREMITY N/A 09/11/2021   Procedure: ABDOMINAL AORTOGRAM W/LOWER EXTREMITY;  Surgeon: Wellington Hampshire, MD;  Location: [facility];  Service: Cardiovascular;  Laterality: N/A;   AMPUTATION Right 09/25/2021   Procedure: RIGHT TRANSMETATARSAL AMPUTATION AND APPLY TISSUE GRAFT;  Surgeon: Newt Minion, MD;  Location: [facility];  Service: Orthopedics;  Laterality: Right;   AMPUTATION Right 11/16/2021   Procedure: RIGHT AMPUTATION BELOW KNEE WITH WOUND VAC APPLICATION;  Surgeon: Newt Minion, MD;  Location: [facility];  Service: Orthopedics;  Laterality: Right;   BONE BIOPSY Right 03/06/2021   Procedure: BONE BIOPSY;  Surgeon: Trula Slade, DPM;  Location: [facility];   Service: Podiatry;  Laterality: Right;   CENTRAL VENOUS CATHETER INSERTION Right 01/20/2020   Procedure: INSERTION CENTRAL LINE ADULT; Tunneled central line;  Surgeon: Virl Cagey, MD;  Location: [facility];  Service: General;  Laterality: Right;   COLONOSCOPY N/A 12/05/2014   [id number] external and internal hemorrhoid/mild diverticulosis/11 polyps removed   ESOPHAGOGASTRODUODENOSCOPY N/A 12/05/2014   XHB:ZJIR duodenitis   GRAFT APPLICATION Right 67/89/3810   Procedure: GRAFT APPLICATION;  Surgeon: Trula Slade, DPM;  Location: [facility];  Service: Podiatry;  Laterality: Right;   INCISION AND DRAINAGE OF WOUND Right 08/15/2021   Procedure: IRRIGATION AND DEBRIDEMENT WOUND;  Surgeon: Landis Martins, DPM;  Location: [facility];  Service: Podiatry;  Laterality: Right;  need to make card not  a irrigation and debridement card   IR FLUORO GUIDE CV LINE RIGHT  03/01/2019   IR PERC TUN PERIT CATH WO PORT S&I /IMAG  06/27/2021   IR REMOVAL TUN CV CATH W/O FL  05/10/2019   IR REMOVAL TUN CV CATH W/O FL  02/29/2020   IR REMOVAL TUN CV CATH W/O FL  07/19/2021   IR US GUIDE VASC ACCESS RIGHT  03/01/2019   IR US GUIDE VASC ACCESS RIGHT  06/27/2021   IRRIGATION AND DEBRIDEMENT ELBOW Left 06/24/2021   Procedure: IRRIGATION AND  DEBRIDEMENT ELBOW;  Surgeon: Lovell Sheehan, MD;  Location: [facility];  Service: Orthopedics;  Laterality: Left;   KIDNEY TRANSPLANT  03/27/2015   Penile Pump Insertion     PERIPHERAL VASCULAR ATHERECTOMY  09/11/2021   Procedure: PERIPHERAL VASCULAR ATHERECTOMY;  Surgeon: Wellington Hampshire, MD;  Location: [facility];  Service: Cardiovascular;;  Sh[facility]wave Lithotripsy   TEE WITHOUT CARDIOVERSION N/A 01/17/2020   Procedure: TRANSESOPHAGEAL ECHOCARDIOGRAM (TEE) WITH PROPOFOL;  Surgeon: Arnoldo Lenis, MD;  [facility];  Service: Endoscopy;  Laterality: N/A;   WOUND DEBRIDEMENT Right 03/06/2021   Procedure: DEBRIDEMENT WOUND;  Surgeon: Trula Slade, DPM;   Location: [facility];  Service: Podiatry;  Laterality: Right;   Patient Active Problem List   Diagnosis Date Noted   Cutaneous abscess of right foot    Hyperkalemia 08/28/2021   Obesity (BMI 30-39.9) 08/28/2021   History of anemia due to chronic kidney disease 08/28/2021   Diabetic foot ulcer ([place]) 08/14/2021   Hypomagnesemia 06/25/2021   Septic arthritis of elbow, left ([place]) 06/24/2021   Peripheral vascular disease ([place]) 04/24/2020   Pyogenic inflammation of bone ([facility])    Thrombophlebitis of superficial veins of right lower extremity    Difficult intravenous access    MRSA bacteremia    Diabetic foot infection ([place])    Infected wound 01/12/2020   Critical lower limb ischemia ([place]) 02/02/2019   Personal history of noncompliance with medical treatment, presenting hazards to health 10/07/2018   Critical limb ischemia with history of revascularization of same extremity ([place]) 07/27/2018   Mixed hyperlipidemia 02/19/2018   History of renal transplant 02/19/2018   Conductive hearing loss, bilateral 08/31/2017   Nuclear sclerotic cataract of right eye 10/07/2016   Pseudophakia of left eye 10/07/2016   Hypertension due to endocrine disorder 03/04/2016   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 ([place]) 03/04/2016   Renal failure 03/04/2016   Proliferative diabetic retinopathy with macular edema associated with type 2 diabetes mellitus ([place]) 03/04/2016   Immunosuppression ([place]) 12/10/2015   Nausea vomiting and diarrhea 12/10/2015   Type 2 diabetes mellitus ([place]) 12/10/2015   Essential hypertension, benign 12/10/2015   Pancytopenia ([place]) 12/10/2015   Encounter for screening colonoscopy 11/15/2014   GERD (gastroesophageal reflux disease) 11/15/2014    REFERRING DIAG: O96.295M (ICD-10-CM) - Below-knee amputation of right lower extremit   ONSET DATE: 04/10/2022 prosthesis delivery   THERAPY DIAG:  Unsteadiness on feet  Other  abnormalities of gait and mobility  Muscle weakness (generalized)  Wound of right lower extremity, initial encounter  Rationale for Evaluation and Treatment Rehabilitation  PERTINENT HISTORY: DM2, PVD, ESRD s/p renal transplant 03/09/2015 on immunosuppressive meds, HTN   PRECAUTIONS: None  SUBJECTIVE:  SUBJECTIVE STATEMENT:  He continues to wear prosthesis all awake hours without wounds or limb pain.  Denies falls.  He got his insurance issues worked out which is why he has not been in PT last couple of weeks.    He reports was late this morning due to low blood sugar that he had a lot of trouble raising to safe level.   PAIN:  Are you having pain? Residual limb pain 0/10 soreness from wearing prosthesis without ability to seat limb into socket.   OBJECTIVE: (objective measures completed at initial evaluation unless otherwise dated) POSTURE: rounded shoulders, forward head, flexed trunk , and weight shift left   LOWER EXTREMITY ROM:   ROM P:passive  A:active Right eval Left eval  Hip flexion      Hip extension      Hip abduction      Hip adduction      Hip internal rotation      Hip external rotation      Knee flexion 100*    Knee extension -5*    Ankle dorsiflexion      Ankle plantarflexion      Ankle inversion      Ankle eversion       (Blank rows = not tested)   LOWER EXTREMITY MMT:   MMT Right eval Left eval  Hip flexion 3+/5    Hip extension 3-/5    Hip abduction 3-/5    Hip adduction      Hip internal rotation      Hip external rotation      Knee flexion 3-/5    Knee extension 3/5    Ankle dorsiflexion      Ankle plantarflexion      Ankle inversion      Ankle eversion      (Blank rows = not tested)   TRANSFERS: Sit to stand: 18" chair with armrests to RW with supervision.   Stand to sit: RW to 18" chair with armrests with supervision   PROSTHETIC GAIT WITH BKA PROSTHESIS: Gait pattern: step to pattern, decreased arm swing- Right, decreased step length- Right, decreased stance time- Left, decreased stride length, decreased hip/knee flexion- Right, circumduction- Right, Right hip hike, knee flexed in stance- Right, antalgic, lateral hip instability, trunk flexed, and abducted- Right Distance walked: 42' with RW & 20' with cane Assistive device utilized: Single point cane, Walker - 2 wheeled, and BKA prosthesis Level of assistance: SBA with RW and Min A Gait velocity: 0.89 ft/sec Comments: excessive weight bearing on RW, partial weight on prosthesis   FUNCTIONAL TESTs:  05/12/2022:  Merrilee Jansky Balance test 42/56 TUG with prosthesis only: std 9.96sec & cog 10.94sec      04/15/2022: Timed up and go (TUG): 39.25sec with cane & BKA prosthesis Berg Balance Scale: 24/56     CURRENT PROSTHETIC WEAR ASSESSMENT: Patient is dependent with: skin check, residual limb care, care of non-amputated limb, prosthetic cleaning, ply sock cleaning, correct ply sock adjustment, proper wear schedule/adjustment, and proper weight-bearing schedule/adjustment Donning prosthesis: Min A Doffing prosthesis: SBA Prosthetic wear tolerance: 3 hours, 1-2x/day, 5 of 5 days since delivery Prosthetic weight bearing tolerance: 5 minutes Edema: pitting. Residual limb condition: 72m scab wound on medial incision, dry skin, normal color & temperature, cylindrical shape Prosthetic description: silicon liner with pin lock suspension, total contact socket, hydraulic ankle / K3 foot K code/activity level with prosthetic use: Level 3       TODAY'S TREATMENT:  DATE:  06/17/2022: Self-Care: PT educated / discussed low blood glucose relationship to fall risk & wound healing.   PT also educated on issues with wearing old shoes with wear patterns.  It can exaggerate any issues of prosthetic leans & cause breakdown of left foot.  PT educated on features of proper shoe for diabetic. Pt & wife verbalized understanding.   Prosthetic Training with TTA prosthesis: Pre-gait in //bars using mirror, demo, verbal & tactile cues for right trunk elongation in stance and wt shift over prosthesis with pelvis not lateral trunk lean.    Rocker board right/midline/left   RLE stance stepping LLE forward & back - he improved control forward but limited back step.  Progressed from BUE support to RUE support.  Amb in //bars forward / backward with PT manual cues to limit shoulder / upper body lean.  Sidestepping with focus on RLE stance / trunk elongation Pt educated on performing above last 3 activities at home near counter on right side. Pt verbalized understanding.   Pt amb 250' & 90' with tactile & verbal cues for carryover of above.    06/02/2022: Prosthetic Training with TTA prosthesis: Pre-gait in //bars in step position working on wt shift over prosthesis without hip or trunk collapse. Progressed to gait in//bars with carryover. Walking in bars without UE support retro and sideways 4 round trips Stepping over 2 black bolsters with only intermitt UE support PRN stepping over with Rt leg first Dynamic balance with gait: (gait belt and CGA with this) walking with head turns 40 feet, head nods 40 feet, eyes closed 40 feet X 3 4 square balance X 5 CW and X 5 CCW (gait belt and CGA with this) Gait working on arm swing with counter trunk rotation with pt/PT holding pipes. Verbal cues for pelvic wt shift over prosthesis in stance. Red theraband 2X 10 each for bilat hip abduction and bilat hip extension with UE support Gait 300 feet with supervision working on carry over for arm swing and trunk control.   05/29/2022: Prosthetic Training with TTA prosthesis: Pre-gait in //bars in step  position working on wt shift over prosthesis without hip or trunk collapse. Progressed to gait in//bars with carryover. Gait working on arm swing with counter trunk rotation with pt/PT holding pipes. Verbal cues for pelvic wt shift over prosthesis in stance. Pelvic wt shift with green theraband resistance cues to maintain trunk without lateral lean. Progressed to tapping LLE on cone. Pelvic wt shift with right grazing counter with RUE on counter working on trunk upright without lateral lean - stepping LLE forward / back for RLE stance.  Red theraband steps with LLE for RLE stance without dipping or trunk lean LUE support 15 reps ea flex, add, ext & abd   HOME EXERCISE PROGRAM: Access Code: [facility] URL: [email or web address] Date: 05/22/2022 Prepared by: Jamey Reas  Exercises - Hip Flexor Stretch at [facility] of Bed  - 1-3 x daily - 7 x weekly - 1 sets - 3 reps - 20-30 seconds hold - Supine Quadriceps Stretch with Strap on Table  - 1-3 x daily - 7 x weekly - 1 sets - 3 reps - 20-30 seconds hold - Supine Bridge  - 1 x daily - 7 x weekly - 2-3 sets - 10 reps - 5 seconds hold - Marching Bridge  - 1 x daily - 7 x weekly - 2-3 sets - 10 reps - 5 seconds hold - Standing Terminal Knee Extension with Resistance  - 1 x  daily - 7 x weekly - 2-3 sets - 10 reps - 5 seconds hold - Standing Hip Flexion with Resistance (Mirrored)  - 1 x daily - 7 x weekly - 1 sets - 10 reps - Standing Hip Adduction with Resistance (Mirrored)  - 1 x daily - 7 x weekly - 1 sets - 10 reps - Standing Hip Extension with Resistance  - 1 x daily - 7 x weekly - 1 sets - 10 reps - Standing Hip Abduction with Theraband Resistance  - 1 x daily - 7 x weekly - 1 sets - 10 reps - Standing Tandem Balance with Counter Support  - 1 x daily - 7 x weekly - 1 sets - 3-4 reps - 30 seconds hold - Tandem Walking with Counter Support  - 1 x daily - 7 x weekly - 1 sets - 6 reps - single leg stance with one foot in lower cabinet  - 1 x  daily - 7 x weekly - 1 sets - 5-10 reps - 10 seconds hold - Carioca with Counter Support  - 1 x daily - 7 x weekly - 1 sets - 10 reps - Alternating Punch with Resistance  - 1 x daily - 5 x weekly - 1 sets - 10 reps - 5 seconds hold - Standing Scapular Protraction with Resistance  - 1 x daily - 7 x weekly - 1 sets - 10 reps - 5 seconds hold - Standing alternate rows with resistance  - 1 x daily - 7 x weekly - 1 sets - 10 reps - 5 seconds hold - Standing Row with Anchored Resistance  - 1 x daily - 7 x weekly - 1 sets - 10 reps - 5 seconds hold - Alternating elbow flexion with resistance  - 1 x daily - 7 x weekly - 1 sets - 10 reps - 5 seconds hold - Standing Bicep Curls with Resistance  - 1 x daily - 7 x weekly - 1 sets - 10 reps - 5 seconds hold   Do each exercise 1-2  times per day Do each exercise 5-10 repetitions Hold each exercise for 2 seconds to feel your location  AT [place].  Try to find this position when standing still for activities.   USE TAPE ON FLOOR TO MARK THE MIDLINE POSITION which is even with middle of sink.  You also should try to feel with your limb pressure in socket.  You are trying to feel with limb what you used to feel with the bottom of your foot.  Side to Side Shift: Moving your hips only (not shoulders): move weight onto your left leg, HOLD/FEEL pressure in socket.  Move back to equal weight on each leg, HOLD/FEEL pressure in socket. Move weight onto your right leg, HOLD/FEEL pressure in socket. Move back to equal weight on each leg, HOLD/FEEL pressure in socket. Repeat.  Start with both hands on sink, progress to hand on prosthetic side only, then no hands.  Front to Back Shift: Moving your hips only (not shoulders): move your weight forward onto your toes, HOLD/FEEL pressure in socket. Move your weight back to equal Flat Foot on both legs, HOLD/FEEL  pressure in socket. Move your weight back onto  your heels, HOLD/FEEL  pressure in socket. Move your weight back to equal on both legs, HOLD/FEEL  pressure in socket. Repeat.  Start with both hands on sink, progress to hand on  prosthetic side only, then no hands.  Moving Cones / Cups: With equal weight on each leg: Hold on with one hand the first time, then progress to no hand supports. Move cups from one side of sink to the other. Place cups ~2" out of your reach, progress to 10" beyond reach.  Place one hand in middle of sink and reach with other hand. Do both arms.  Then hover one hand and move cups with other hand.  Overhead/Upward Reaching: alternated reaching up to top cabinets or ceiling if no cabinets present. Keep equal weight on each leg. Start with one hand support on counter while other hand reaches and progress to no hand support with reaching.  ace one hand in middle of sink and reach with other hand. Do both arms.  Then hover one hand and move cups with other hand.  5.   Looking Over Shoulders: With equal weight on each leg: alternate turning to look over your shoulders with one hand support on counter as needed.  Start with head motions only to look in front of shoulder, then even with shoulder and progress to looking behind you. To look to side, move head /eyes, then shoulder on side looking pulls back, shift more weight to side looking and pull hip back. Place one hand in middle of sink and let go with other hand so your shoulder can pull back. Switch hands to look other way.   Then hover one hand and look over shoulder. If looking right, use left hand at sink. If looking left, use right hand at sink. 6.  Stepping with leg that is not amputated:  Move items under cabinet out of your way. Shift your hips/pelvis so weight on prosthesis. Tighten muscles in hip on prosthetic side.  SLOWLY step other leg so front of foot is in cabinet. Then step back to floor.      ASSESSMENT:   CLINICAL IMPRESSION: Pt appears to have better understanding  of shoes including recommendations.  PT worked on decreasing lateral trunk lean in stance which improved with instruction, focus & UE support.  OBJECTIVE IMPAIRMENTS: Abnormal gait, decreased activity tolerance, decreased balance, decreased endurance, decreased knowledge of condition, decreased knowledge of use of DME, decreased mobility, decreased ROM, decreased strength, increased edema, impaired flexibility, postural dysfunction, prosthetic dependency , obesity, and wound on residual limb .    ACTIVITY LIMITATIONS: carrying, lifting, bending, standing, squatting, stairs, transfers, locomotion level, and prosthetic use   PARTICIPATION LIMITATIONS: meal prep, cleaning, laundry, driving, community activity, occupation, and yard work   PERSONAL FACTORS: Fitness, Time since onset of injury/illness/exacerbation, and 3+ comorbidities: see PMH  are also affecting patient's functional outcome.    REHAB POTENTIAL: Good   CLINICAL DECISION MAKING: Evolving/moderate complexity   EVALUATION COMPLEXITY: Moderate     GOALS: Goals reviewed with patient? Yes   SHORT TERM GOALS: Target date: 06/20/2022   Patient verbalizes how to adjust ply socks. Baseline: SEE OBJECTIVE DATA Goal status: Ongoing 05/29/2022 2.  Patient tolerates prosthesis >90% awake hrs /day without skin issues or limb pain after standing. Baseline: SEE OBJECTIVE DATA Goal status:  MET 06/17/2022   3. Berg Balance >45/56 Baseline: SEE OBJECTIVE DATA Goal status:  Ongoing 05/29/2022   4. Patient ambulates 400' with prosthesis with supervision. Baseline: SEE OBJECTIVE DATA Goal status: Ongoing 05/29/2022   5. Patient negotiates ramps & curbs with prosthesis with supervision Baseline: SEE OBJECTIVE DATA Goal status: Ongoing 05/29/2022   LONG TERM GOALS: Target date: 07/31/2021  Patient demonstrates & verbalized understanding of prosthetic care to enable safe utilization of prosthesis. Baseline: SEE OBJECTIVE DATA Goal status: Ongoing  05/29/2022   Patient tolerates prosthesis wear >90% of awake hours without skin or limb pain issues. Baseline: SEE OBJECTIVE DATA Goal status: Ongoing 05/29/2022   Functional Gait Assessment >22/30 to indicate lower fall risk Baseline: SEE OBJECTIVE DATA Goal status: Ongoing 05/29/2022   Patient ambulates >500' with prosthesis only independently Baseline: SEE OBJECTIVE DATA Goal status: Ongoing 05/29/2022   Patient negotiates ramps, curbs & stairs with single rail with prosthesis only independently. Baseline: SEE OBJECTIVE DATA Goal status: Ongoing 05/29/2022   Patient demonstrates & verbalizes lifting, carrying, pushing, pulling with prosthesis only safely. Baseline: SEE OBJECTIVE DATA Goal status:  Ongoing 05/29/2022     PLAN:   PT FREQUENCY: 3x/week for 5 weeks, then 1-2 x/wk for 10 weeks   PT DURATION: other: 15 weeks   PLANNED INTERVENTIONS: Therapeutic exercises, Therapeutic activity, Neuromuscular re-education, Balance training, Gait training, Patient/Family education, Self Care, Stair training, Vestibular training, Prosthetic training, DME instructions, and physical performance testing   PLAN FOR NEXT SESSION:   check STGs,  continue prosthetic gait without cane, balance activities working on [employer], PT, DPT 06/17/2022, 9:12 AM

## 2022-06-18 ENCOUNTER — Encounter: Payer: Self-pay | Admitting: Nurse Practitioner

## 2022-06-18 ENCOUNTER — Ambulatory Visit (INDEPENDENT_AMBULATORY_CARE_PROVIDER_SITE_OTHER): Payer: 59 | Admitting: Nurse Practitioner

## 2022-06-18 VITALS — BP 134/78 | HR 68 | Temp 98.2°F | Ht 67.0 in | Wt 246.0 lb

## 2022-06-18 DIAGNOSIS — E782 Mixed hyperlipidemia: Secondary | ICD-10-CM

## 2022-06-18 DIAGNOSIS — N185 Chronic kidney disease, stage 5: Secondary | ICD-10-CM

## 2022-06-18 DIAGNOSIS — I129 Hypertensive chronic kidney disease with stage 1 through stage 4 chronic kidney disease, or unspecified chronic kidney disease: Secondary | ICD-10-CM | POA: Diagnosis not present

## 2022-06-18 DIAGNOSIS — Z89511 Acquired absence of right leg below knee: Secondary | ICD-10-CM

## 2022-06-18 DIAGNOSIS — R0601 Orthopnea: Secondary | ICD-10-CM

## 2022-06-18 DIAGNOSIS — E1122 Type 2 diabetes mellitus with diabetic chronic kidney disease: Secondary | ICD-10-CM | POA: Diagnosis not present

## 2022-06-18 DIAGNOSIS — E113513 Type 2 diabetes mellitus with proliferative diabetic retinopathy with macular edema, bilateral: Secondary | ICD-10-CM

## 2022-06-18 DIAGNOSIS — N182 Chronic kidney disease, stage 2 (mild): Secondary | ICD-10-CM

## 2022-06-18 DIAGNOSIS — N189 Chronic kidney disease, unspecified: Secondary | ICD-10-CM | POA: Insufficient documentation

## 2022-06-18 DIAGNOSIS — R140 Abdominal distension (gaseous): Secondary | ICD-10-CM

## 2022-06-18 DIAGNOSIS — Z794 Long term (current) use of insulin: Secondary | ICD-10-CM

## 2022-06-18 NOTE — Patient Instructions (Signed)

## 2022-06-18 NOTE — Progress Notes (Signed)
I,[name],acting as a Education administrator for [employer], FNP.,have documented all relevant documentation on the behalf of [name], FNP,as directed by  [name], FNP while in the presence of [name], [place].  Subjective:     Patient ID: Raymond [name] , male    DOB: 1960-10-17 , 62 y.o.   MRN: [id number]   Chief Complaint  Patient presents with   Diabetes    HPI  Patient presents today for a BP and DM check. Continues to go to PT 3 days a week. He is currently out of work. He has been out of work since last year when he had his leg amputated. He is now on disability in April. He was working as Pharmacologist, was working full time. He is unable to stand for long periods.   He is having shortness of breath and reports he is gaining weight.  He watches tv all night long sitting in the chair. Has gradually started for months. Denies feeling down and depressed. He is having orthopnea  Wt Readings from Last 3 Encounters: 06/18/22 : 246 lb (111.6 kg) 05/22/22 : 241 lb (109.3 kg) 04/29/22 : 238 lb (108 kg)  He was given furosemide   Diabetes He presents for his follow-up diabetic visit. He has type 2 diabetes mellitus. There are no hypoglycemic associated symptoms. Pertinent negatives for hypoglycemia include no nervousness/anxiousness. There are no diabetic associated symptoms. Pertinent negatives for diabetes include no chest pain, no polydipsia, no polyphagia and no polyuria. There are no hypoglycemic complications. There are no diabetic complications. Risk factors for coronary artery disease include obesity, sedentary lifestyle, male sex, hypertension, dyslipidemia and diabetes mellitus (Wheelchair bound). Current diabetic treatment includes oral agent (dual therapy).  Hypertension Pertinent negatives include no chest pain or palpitations.     Past Medical History:  Diagnosis Date   Diabetes ([place])    Diabetes mellitus without complication ([place])    type 2   ESRD (end  stage renal disease) ([place])    03/09/2015- patient had a kidney transplant   Hypertension    Peripheral vascular disease ([place])    Renal disorder    Renal insufficiency      Family History  Problem Relation Age of Onset   Diabetes Mother    Heart disease Father    Colon cancer Neg Hx      Current Outpatient Medications:    aspirin EC 81 MG tablet, Take 1 tablet (81 mg total) by mouth daily. Swallow whole., Disp: 150 tablet, Rfl: 0   atorvastatin (LIPITOR) 10 MG tablet, Take 1 tablet (10 mg total) by mouth daily., Disp: 90 tablet, Rfl: 3   blood glucose meter kit and supplies KIT, Dispense based on patient and insurance preference. Use up to four times daily as directed. (FOR ICD-9 250.00, 250.01)., Disp: 1 each, Rfl: 0   Blood Glucose Monitoring Suppl (ACCU-CHEK GUIDE ME) w/Device KIT, 1 Piece by Does not apply route as directed., Disp: 1 kit, Rfl: 0   brimonidine (ALPHAGAN) 0.2 % ophthalmic solution, Place 1 drop into both eyes 3 (three) times daily., Disp: , Rfl:    cloNIDine (CATAPRES - DOSED IN MG/24 HR) 0.3 mg/24hr patch, Place 1 patch (0.3 mg total) onto the skin once a week. (Patient taking differently: Place 0.3 mg onto the skin once a week. Place patch on Sunday), Disp: 4 patch, Rfl: 12   clopidogrel (PLAVIX) 75 MG tablet, Take 1 tablet (75 mg total) by mouth daily., Disp: 30 tablet, Rfl: 6  Continuous Blood Gluc Receiver ([place]) DEVI, Use to check blood sugar 3 times a day. Dx code e11.65, Disp: 1 each, Rfl: 1   Continuous Blood Gluc Sensor (DEXCOM G7 SENSOR) MISC, USE TO CHECK GLUCOSE THREE TIMES DAILY AS DIRECTED - CHANGE SENSOR EVERY 10 DAYS, Disp: 3 each, Rfl: 0   docusate sodium (COLACE) 100 MG capsule, Take 1 capsule (100 mg total) by mouth daily., Disp: 10 capsule, Rfl: 0   dorzolamide-timolol (COSOPT) 22.3-6.8 MG/ML ophthalmic solution, Place 1 drop into both eyes 2 (two) times daily., Disp: , Rfl:    doxazosin (CARDURA) 4 MG tablet, Take 4 mg by mouth  daily., Disp: , Rfl:    Dulaglutide (TRULICITY) 3 HG/9.9ME SOPN, Inject 3 mg as directed once a week., Disp: 6 mL, Rfl: 1   furosemide (LASIX) 20 MG tablet, 20 mg 2 (two) times daily., Disp: , Rfl:    gabapentin (NEURONTIN) 300 MG capsule, Take 3 capsules (900 mg total) by mouth 3 (three) times daily. 3 times a day, Disp: 270 capsule, Rfl: 3   glucose blood (ACCU-CHEK GUIDE) test strip, Use as instructed 4 x daily. E11.65 (Patient taking differently: 3 (three) times daily.), Disp: 150 each, Rfl: 5   hydrALAZINE (APRESOLINE) 25 MG tablet, Take 1 tablet (25 mg total) by mouth 3 (three) times daily., Disp: 270 tablet, Rfl: 1   insulin aspart (NOVOLOG FLEXPEN) 100 UNIT/ML FlexPen, 25 units before meals, Disp: 15 mL, Rfl: 1   latanoprost (XALATAN) 0.005 % ophthalmic solution, Place 1 drop into both eyes at bedtime., Disp: , Rfl:    Multiple Vitamin (MULTIVITAMIN) capsule, Take 1 capsule by mouth daily., Disp: , Rfl:    mupirocin ointment (BACTROBAN) 2 %, Apply 1 application. topically 2 (two) times daily. For wound care, Disp: 30 g, Rfl: 0   NOVOLIN N FLEXPEN 100 UNIT/ML FlexPen, INJECT 30 UNITS INTO THE SKIN IN THE MORNING AND 45 UNITS AT BEDTIME, Disp: 15 mL, Rfl: 0   NOVOLIN R FLEXPEN RELION 100 UNIT/ML FlexPen, INJECT 1-9 UNITS SUBCUTANEOUSLY AS DIRECTED THREE TIMES DAILY BEFORE MEALS USING INCLUDED SLIDING SCALE, Disp: 15 mL, Rfl: 0   ondansetron (ZOFRAN) 4 MG tablet, Take 1 tablet (4 mg total) by mouth every 6 (six) hours as needed for nausea., Disp: 20 tablet, Rfl: 0   sodium bicarbonate 650 MG tablet, Take 1,300 mg by mouth 3 (three) times daily., Disp: , Rfl:    sodium zirconium cyclosilicate (LOKELMA) 10 g PACK packet, Take 10 g by mouth daily., Disp: 30 packet, Rfl: 1   tacrolimus (PROGRAF) 1 MG capsule, Take 4 capsules (4 mg total) by mouth 2 (two) times daily., Disp: , Rfl:    Vitamin D, Ergocalciferol, (DRISDOL) 50000 units CAPS capsule, Take 50,000 Units by mouth See admin instructions.  15th of the month, Disp: , Rfl:   Current Facility-Administered Medications:    sodium chloride flush (NS) 0.9 % injection 3 mL, 3 mL, Intravenous, Q12H, Arida, Muhammad A, MD   Allergies  Allergen Reactions   Doxycycline     Runs up pt's blood sugar and phosphorus   Bactrim [Sulfamethoxazole-Trimethoprim]     Runs pt's sugar and phosphorus   Flagyl [Metronidazole] Rash    Patient having hives to either the vancomycin or flagyl (both infusing together).   Vancomycin Rash    Patient having hives to either vancomycin or flagyl     Review of Systems  Constitutional: Negative.   Respiratory: Negative.    Cardiovascular: Negative.  Negative for chest  pain, palpitations and leg swelling.  Gastrointestinal: Negative.   Endocrine: Negative for polydipsia, polyphagia and polyuria.  Neurological: Negative.   Psychiatric/Behavioral:  Positive for sleep disturbance (taking melatonin). The patient is not nervous/anxious.      Today's Vitals   06/18/22 0911  BP: 134/78  Pulse: 68  Temp: 98.2 F (36.8 C)  TempSrc: Oral  Weight: 246 lb (111.6 kg)  Height: '5\' 7"'$  (1.702 m)   Body mass index is 38.53 kg/m.   Objective:  Physical Exam Vitals reviewed.  Constitutional:      General: He is not in acute distress.    Appearance: Normal appearance. He is obese.  Eyes:     Pupils: Pupils are equal, round, and reactive to light.  Cardiovascular:     Rate and Rhythm: Normal rate and regular rhythm.     Pulses: Normal pulses.     Heart sounds: Normal heart sounds. No murmur heard. Pulmonary:     Effort: Pulmonary effort is normal. No respiratory distress.     Breath sounds: Normal breath sounds. No wheezing.  Abdominal:     General: Bowel sounds are normal. There is distension (taut).     Palpations: There is no mass.     Tenderness: There is no abdominal tenderness.  Skin:    General: Skin is warm.     Capillary Refill: Capillary refill takes less than 2 seconds.  Neurological:      General: No focal deficit present.     Mental Status: He is alert and oriented to person, place, and time.     Cranial Nerves: No cranial nerve deficit.     Motor: No weakness.  Psychiatric:        Mood and Affect: Mood normal.        Behavior: Behavior normal.        Thought Content: Thought content normal.        Judgment: Judgment normal.         Assessment And Plan:     1. Type 2 diabetes mellitus with stage 2 chronic kidney disease, with long-term current use of insulin (HCC) Comments: Hemoglobin A1c has been stable.  Continue current medications.  Diabetic foot exam done to left foot.  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 Proliferative diabetic retinopathy of both eyes with macular edema associated with type 2 diabetes mellitus ([place])  3. Benign hypertension with chronic kidney disease Comments: Blood pressure is controlled.  Continue current medications.  Continue follow-up with transplant team in [place] for kidney transplant.  4. Mixed hyperlipidemia  5. Acquired absence of right leg below knee (HCC)  6. Orthopnea Comments: Will check BNP.  Will also check chest rate. We did discuss possibly having some component of deconditioning.  - Brain natriuretic peptide - ECHOCARDIOGRAM COMPLETE; Future - DG Chest 2 View; Future  7. Abdominal distention Comments: Abdomen is distended slightly taut.  Will check BNP and CT scan of the abdomen. - Brain natriuretic peptide - CT Abdomen Pelvis Wo Contrast; Future     Patient was given opportunity to ask questions. Patient verbalized understanding of the plan and was able to repeat key elements of the plan. All questions were answered to their satisfaction.  [name], FNP   I, [name], FNP, have reviewed all documentation for this visit. The documentation on 06/18/22 for the exam, diagnosis, procedures, and orders are all accurate and complete.   IF YOU HAVE BEEN REFERRED TO A SPECIALIST,  IT MAY TAKE 1-2 WEEKS TO SCHEDULE/PROCESS THE REFERRAL. IF YOU HAVE NOT HEARD FROM US/SPECIALIST IN TWO WEEKS, PLEASE GIVE [place] A CALL AT [phone].   THE PATIENT IS ENCOURAGED TO PRACTICE SOCIAL DISTANCING DUE TO THE COVID-19 PANDEMIC.

## 2022-06-19 ENCOUNTER — Encounter: Payer: 59 | Admitting: Physical Therapy

## 2022-06-19 NOTE — Therapy (Incomplete)
OUTPATIENT PHYSICAL THERAPY TREATMENT NOTE   Patient Name: Raymond [name] MRN: [id number] DOB:[birth year], 62 y.o., male Today's Date: 06/19/2022  PCP: [name], FNP  REFERRING PROVIDER: Newt Minion, MD       END OF SESSION:                Past Medical History:  Diagnosis Date   Diabetes [facility])    Diabetes mellitus without complication ([place])    type 2   ESRD (end stage renal disease) ([place])    03/09/2015- patient had a kidney transplant   Hypertension    Peripheral vascular disease ([place])    Renal disorder    Renal insufficiency    Past Surgical History:  Procedure Laterality Date   ABDOMINAL AORTOGRAM W/LOWER EXTREMITY N/A 09/11/2021   Procedure: ABDOMINAL AORTOGRAM W/LOWER EXTREMITY;  Surgeon: Wellington Hampshire, MD;  Location: [place] [facility];  Service: Cardiovascular;  Laterality: N/A;   AMPUTATION Right 09/25/2021   Procedure: RIGHT TRANSMETATARSAL AMPUTATION AND APPLY TISSUE GRAFT;  Surgeon: Newt Minion, MD;  Location: [place];  Service: Orthopedics;  Laterality: Right;   AMPUTATION Right 11/16/2021   Procedure: RIGHT AMPUTATION BELOW KNEE WITH WOUND VAC APPLICATION;  Surgeon: Newt Minion, MD;  Location: [place];  Service: Orthopedics;  Laterality: Right;   BONE BIOPSY Right 03/06/2021   Procedure: BONE BIOPSY;  Surgeon: Trula Slade, DPM;  [facility]: [facility];  Service: Podiatry;  Laterality: Right;   CENTRAL VENOUS CATHETER INSERTION Right 01/20/2020   Procedure: INSERTION CENTRAL LINE ADULT; Tunneled central line;  Surgeon: Virl Cagey, MD;  Location: [facility];  Service: General;  Laterality: Right;   COLONOSCOPY N/A 12/05/2014   FVC:BSWHQPRF external and internal hemorrhoid/mild diverticulosis/11 polyps removed   ESOPHAGOGASTRODUODENOSCOPY N/A 12/05/2014   FMB:WGYK duodenitis   GRAFT APPLICATION Right 59/93/5701   Procedure: GRAFT APPLICATION;  Surgeon: Trula Slade, DPM;  [facility]: [facility];  Service: Podiatry;  Laterality:  Right;   INCISION AND DRAINAGE OF WOUND Right 08/15/2021   Procedure: IRRIGATION AND DEBRIDEMENT WOUND;  Surgeon: Landis Martins, DPM;  [facility];  Service: Podiatry;  Laterality: Right;  need to make card not  a irrigation and debridement card   IR FLUORO GUIDE CV LINE RIGHT  03/01/2019   IR PERC TUN PERIT CATH WO PORT S&I /IMAG  06/27/2021   IR REMOVAL TUN CV CATH W/O FL  05/10/2019   IR REMOVAL TUN CV CATH W/O FL  02/29/2020   IR REMOVAL TUN CV CATH W/O FL  07/19/2021   IR US GUIDE VASC ACCESS RIGHT  03/01/2019   IR US GUIDE VASC ACCESS RIGHT  06/27/2021   IRRIGATION AND DEBRIDEMENT ELBOW Left 06/24/2021   Procedure: IRRIGATION AND DEBRIDEMENT ELBOW;  Surgeon: Lovell Sheehan, MD;  Location: [facility];  Service: Orthopedics;  Laterality: Left;   KIDNEY TRANSPLANT  03/27/2015   Penile Pump Insertion     PERIPHERAL VASCULAR ATHERECTOMY  09/11/2021   Procedure: PERIPHERAL VASCULAR ATHERECTOMY;  Surgeon: Wellington Hampshire, MD;  Location: [place] [facility];  Service: Cardiovascular;;  Shockwave Lithotripsy   TEE WITHOUT CARDIOVERSION N/A 01/17/2020   Procedure: TRANSESOPHAGEAL ECHOCARDIOGRAM (TEE) WITH PROPOFOL;  Surgeon: Arnoldo Lenis, MD;  [facility];  Service: Endoscopy;  Laterality: N/A;   WOUND DEBRIDEMENT Right 03/06/2021   Procedure: DEBRIDEMENT WOUND;  Surgeon: Trula Slade, DPM;  [facility];  Service: Podiatry;  Laterality: Right;   Patient Active Problem List   Diagnosis Date Noted  Acquired absence of right leg below knee ([place]) 06/18/2022   Chronic kidney disease, stage 5 (HCC) 06/18/2022   Cutaneous abscess of right foot    Hyperkalemia 08/28/2021   Obesity (BMI 30-39.9) 08/28/2021   History of anemia due to chronic kidney disease 08/28/2021   Diabetic foot ulcer ([place]) 08/14/2021   Hypomagnesemia 06/25/2021   Peripheral vascular disease ([place]) 04/24/2020   Thrombophlebitis of superficial veins of right lower extremity    Difficult intravenous  access    MRSA bacteremia    Diabetic foot infection ([place])    Infected wound 01/12/2020   Critical lower limb ischemia ([place]) 02/02/2019   Personal history of noncompliance with medical treatment, presenting hazards to health 10/07/2018   Critical limb ischemia with history of revascularization of same extremity ([place]) 07/27/2018   Mixed hyperlipidemia 02/19/2018   History of renal transplant 02/19/2018   Conductive hearing loss, bilateral 08/31/2017   Nuclear sclerotic cataract of right eye 10/07/2016   Pseudophakia of left eye 10/07/2016   Hypertension due to endocrine disorder 03/04/2016   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 ([place]) 03/04/2016   Renal failure 03/04/2016   Proliferative diabetic retinopathy with macular edema associated with type 2 diabetes mellitus ([place]) 03/04/2016   Immunosuppression ([place]) 12/10/2015   Nausea vomiting and diarrhea 12/10/2015   Type 2 diabetes mellitus ([place]) 12/10/2015   Essential hypertension, benign 12/10/2015   Pancytopenia ([place]) 12/10/2015   Encounter for screening colonoscopy 11/15/2014   GERD (gastroesophageal reflux disease) 11/15/2014    REFERRING DIAG: N23.557D (ICD-10-CM) - Below-knee amputation of right lower extremit   ONSET DATE: 04/10/2022 prosthesis delivery   THERAPY DIAG:  No diagnosis found.  Rationale for Evaluation and Treatment Rehabilitation  PERTINENT HISTORY: DM2, PVD, ESRD s/p renal transplant 03/09/2015 on immunosuppressive meds, HTN   PRECAUTIONS: None  SUBJECTIVE:                                                                                                                                                                                      SUBJECTIVE STATEMENT:  *** He continues to wear prosthesis all awake hours without wounds or limb pain.  Denies falls.  He got his insurance issues worked out which is why he has not been in PT last couple of  weeks.    He reports was late this morning due to low blood sugar that he had a lot of trouble raising to safe level.   PAIN:  Are you having pain? Residual limb pain 0/10 soreness from wearing prosthesis without ability to seat limb into socket.   OBJECTIVE: (objective measures completed at initial evaluation  unless otherwise dated) POSTURE: rounded shoulders, forward head, flexed trunk , and weight shift left   LOWER EXTREMITY ROM:   ROM P:passive  A:active Right eval Left eval  Hip flexion      Hip extension      Hip abduction      Hip adduction      Hip internal rotation      Hip external rotation      Knee flexion 100*    Knee extension -5*    Ankle dorsiflexion      Ankle plantarflexion      Ankle inversion      Ankle eversion       (Blank rows = not tested)   LOWER EXTREMITY MMT:   MMT Right eval Left eval  Hip flexion 3+/5    Hip extension 3-/5    Hip abduction 3-/5    Hip adduction      Hip internal rotation      Hip external rotation      Knee flexion 3-/5    Knee extension 3/5    Ankle dorsiflexion      Ankle plantarflexion      Ankle inversion      Ankle eversion      (Blank rows = not tested)   TRANSFERS: Sit to stand: 18" chair with armrests to RW with supervision.  Stand to sit: RW to 18" chair with armrests with supervision   PROSTHETIC GAIT WITH BKA PROSTHESIS: Gait pattern: step to pattern, decreased arm swing- Right, decreased step length- Right, decreased stance time- Left, decreased stride length, decreased hip/knee flexion- Right, circumduction- Right, Right hip hike, knee flexed in stance- Right, antalgic, lateral hip instability, trunk flexed, and abducted- Right Distance walked: 15' with RW & 20' with cane Assistive device utilized: Single point cane, Walker - 2 wheeled, and BKA prosthesis Level of assistance: SBA with RW and Min A Gait velocity: 0.89 ft/sec Comments: excessive weight bearing on RW, partial weight on prosthesis    FUNCTIONAL TESTs:  05/12/2022:  Merrilee Jansky Balance test 42/56 TUG with prosthesis only: std 9.96sec & cog 10.94sec      04/15/2022: Timed up and go (TUG): 39.25sec with cane & BKA prosthesis Berg Balance Scale: 24/56     CURRENT PROSTHETIC WEAR ASSESSMENT: Patient is dependent with: skin check, residual limb care, care of non-amputated limb, prosthetic cleaning, ply sock cleaning, correct ply sock adjustment, proper wear schedule/adjustment, and proper weight-bearing schedule/adjustment Donning prosthesis: Min A Doffing prosthesis: SBA Prosthetic wear tolerance: 3 hours, 1-2x/day, 5 of 5 days since delivery Prosthetic weight bearing tolerance: 5 minutes Edema: pitting. Residual limb condition: 5m scab wound on medial incision, dry skin, normal color & temperature, cylindrical shape Prosthetic description: silicon liner with pin lock suspension, total contact socket, hydraulic ankle / K3 foot K code/activity level with prosthetic use: Level 3       TODAY'S TREATMENT:  DATE:  06/19/2022: ***   06/17/2022: Self-Care: PT educated / discussed low blood glucose relationship to fall risk & wound healing.  PT also educated on issues with wearing old shoes with wear patterns.  It can exaggerate any issues of prosthetic leans & cause breakdown of left foot.  PT educated on features of proper shoe for diabetic. Pt & wife verbalized understanding.   Prosthetic Training with TTA prosthesis: Pre-gait in //bars using mirror, demo, verbal & tactile cues for right trunk elongation in stance and wt shift over prosthesis with pelvis not lateral trunk lean.    Rocker board right/midline/left   RLE stance stepping LLE forward & back - he improved control forward but limited back step.  Progressed from BUE support to RUE support.  Amb in //bars forward / backward with PT manual  cues to limit shoulder / upper body lean.  Sidestepping with focus on RLE stance / trunk elongation Pt educated on performing above last 3 activities at home near counter on right side. Pt verbalized understanding.   Pt amb 250' & 90' with tactile & verbal cues for carryover of above.    06/02/2022: Prosthetic Training with TTA prosthesis: Pre-gait in //bars in step position working on wt shift over prosthesis without hip or trunk collapse. Progressed to gait in//bars with carryover. Walking in bars without UE support retro and sideways 4 round trips Stepping over 2 black bolsters with only intermitt UE support PRN stepping over with Rt leg first Dynamic balance with gait: (gait belt and CGA with this) walking with head turns 40 feet, head nods 40 feet, eyes closed 40 feet X 3 4 square balance X 5 CW and X 5 CCW (gait belt and CGA with this) Gait working on arm swing with counter trunk rotation with pt/PT holding pipes. Verbal cues for pelvic wt shift over prosthesis in stance. Red theraband 2X 10 each for bilat hip abduction and bilat hip extension with UE support Gait 300 feet with supervision working on carry over for arm swing and trunk control.     HOME EXERCISE PROGRAM: Access Code: [facility] URL: [email or web address] Date: 05/22/2022 Prepared by: Jamey Reas  Exercises - Hip Flexor Stretch at [facility] of Bed  - 1-3 x daily - 7 x weekly - 1 sets - 3 reps - 20-30 seconds hold - Supine Quadriceps Stretch with Strap on Table  - 1-3 x daily - 7 x weekly - 1 sets - 3 reps - 20-30 seconds hold - Supine Bridge  - 1 x daily - 7 x weekly - 2-3 sets - 10 reps - 5 seconds hold - Marching Bridge  - 1 x daily - 7 x weekly - 2-3 sets - 10 reps - 5 seconds hold - Standing Terminal Knee Extension with Resistance  - 1 x daily - 7 x weekly - 2-3 sets - 10 reps - 5 seconds hold - Standing Hip Flexion with Resistance (Mirrored)  - 1 x daily - 7 x weekly - 1 sets - 10 reps - Standing Hip  Adduction with Resistance (Mirrored)  - 1 x daily - 7 x weekly - 1 sets - 10 reps - Standing Hip Extension with Resistance  - 1 x daily - 7 x weekly - 1 sets - 10 reps - Standing Hip Abduction with Theraband Resistance  - 1 x daily - 7 x weekly - 1 sets - 10 reps - Standing Tandem Balance with Counter Support  - 1 x daily - 7 x weekly -  1 sets - 3-4 reps - 30 seconds hold - Tandem Walking with Counter Support  - 1 x daily - 7 x weekly - 1 sets - 6 reps - single leg stance with one foot in lower cabinet  - 1 x daily - 7 x weekly - 1 sets - 5-10 reps - 10 seconds hold - Carioca with Counter Support  - 1 x daily - 7 x weekly - 1 sets - 10 reps - Alternating Punch with Resistance  - 1 x daily - 5 x weekly - 1 sets - 10 reps - 5 seconds hold - Standing Scapular Protraction with Resistance  - 1 x daily - 7 x weekly - 1 sets - 10 reps - 5 seconds hold - Standing alternate rows with resistance  - 1 x daily - 7 x weekly - 1 sets - 10 reps - 5 seconds hold - Standing Row with Anchored Resistance  - 1 x daily - 7 x weekly - 1 sets - 10 reps - 5 seconds hold - Alternating elbow flexion with resistance  - 1 x daily - 7 x weekly - 1 sets - 10 reps - 5 seconds hold - Standing Bicep Curls with Resistance  - 1 x daily - 7 x weekly - 1 sets - 10 reps - 5 seconds hold   Do each exercise 1-2  times per day Do each exercise 5-10 repetitions Hold each exercise for 2 seconds to feel your location  AT [place].  Try to find this position when standing still for activities.   USE TAPE ON FLOOR TO MARK THE MIDLINE POSITION which is even with middle of sink.  You also should try to feel with your limb pressure in socket.  You are trying to feel with limb what you used to feel with the bottom of your foot.  Side to Side Shift: Moving your hips only (not shoulders): move weight onto your left leg, HOLD/FEEL pressure in socket.  Move back to equal  weight on each leg, HOLD/FEEL pressure in socket. Move weight onto your right leg, HOLD/FEEL pressure in socket. Move back to equal weight on each leg, HOLD/FEEL pressure in socket. Repeat.  Start with both hands on sink, progress to hand on prosthetic side only, then no hands.  Front to Back Shift: Moving your hips only (not shoulders): move your weight forward onto your toes, HOLD/FEEL pressure in socket. Move your weight back to equal Flat Foot on both legs, HOLD/FEEL  pressure in socket. Move your weight back onto your heels, HOLD/FEEL  pressure in socket. Move your weight back to equal on both legs, HOLD/FEEL  pressure in socket. Repeat.  Start with both hands on sink, progress to hand on prosthetic side only, then no hands.  Moving Cones / Cups: With equal weight on each leg: Hold on with one hand the first time, then progress to no hand supports. Move cups from one side of sink to the other. Place cups ~2" out of your reach, progress to 10" beyond reach.  Place one hand in middle of sink and reach with other hand. Do both arms.  Then hover one hand and move cups with other hand.  Overhead/Upward Reaching: alternated reaching up to top cabinets or ceiling if no cabinets present. Keep equal weight on each leg. Start with one hand support on counter while other hand reaches and progress to no hand support with reaching.  ace one hand in middle of sink and reach with other hand. Do both arms.  Then hover one hand and move cups with other hand.  5.   Looking Over Shoulders: With equal weight on each leg: alternate turning to look over your shoulders with one hand support on counter as needed.  Start with head motions only to look in front of shoulder, then even with shoulder and progress to looking behind you. To look to side, move head /eyes, then shoulder on side looking pulls back, shift more weight to side looking and pull hip back. Place one hand in middle of sink and let go with other hand so your  shoulder can pull back. Switch hands to look other way.   Then hover one hand and look over shoulder. If looking right, use left hand at sink. If looking left, use right hand at sink. 6.  Stepping with leg that is not amputated:  Move items under cabinet out of your way. Shift your hips/pelvis so weight on prosthesis. Tighten muscles in hip on prosthetic side.  SLOWLY step other leg so front of foot is in cabinet. Then step back to floor.      ASSESSMENT:   CLINICAL IMPRESSION: *** Pt appears to have better understanding of shoes including recommendations.  PT worked on decreasing lateral trunk lean in stance which improved with instruction, focus & UE support.  OBJECTIVE IMPAIRMENTS: Abnormal gait, decreased activity tolerance, decreased balance, decreased endurance, decreased knowledge of condition, decreased knowledge of use of DME, decreased mobility, decreased ROM, decreased strength, increased edema, impaired flexibility, postural dysfunction, prosthetic dependency , obesity, and wound on residual limb .    ACTIVITY LIMITATIONS: carrying, lifting, bending, standing, squatting, stairs, transfers, locomotion level, and prosthetic use   PARTICIPATION LIMITATIONS: meal prep, cleaning, laundry, driving, community activity, occupation, and yard work   PERSONAL FACTORS: Fitness, Time since onset of injury/illness/exacerbation, and 3+ comorbidities: see PMH  are also affecting patient's functional outcome.    REHAB POTENTIAL: Good   CLINICAL DECISION MAKING: Evolving/moderate complexity   EVALUATION COMPLEXITY: Moderate     GOALS: Goals reviewed with patient? Yes   SHORT TERM GOALS: Target date: 06/20/2022   Patient verbalizes how to adjust ply socks. Baseline: SEE OBJECTIVE DATA Goal status: Ongoing 05/29/2022 2.  Patient tolerates prosthesis >90% awake hrs /day without skin issues or limb pain after standing. Baseline: SEE OBJECTIVE DATA Goal status:  MET 06/17/2022   3. Berg Balance  >45/56 Baseline: SEE OBJECTIVE DATA Goal status:  Ongoing 05/29/2022   4. Patient ambulates 400' with prosthesis with supervision. Baseline: SEE OBJECTIVE DATA Goal status: Ongoing 05/29/2022   5. Patient negotiates ramps & curbs with prosthesis with supervision Baseline: SEE OBJECTIVE DATA Goal status: Ongoing 05/29/2022   LONG TERM GOALS: Target date: 07/31/2021   Patient demonstrates & verbalized understanding of prosthetic care to enable safe utilization of prosthesis. Baseline: SEE OBJECTIVE DATA Goal status: Ongoing 05/29/2022   Patient tolerates prosthesis wear >90% of awake hours without skin or limb pain issues. Baseline: SEE OBJECTIVE DATA Goal status: Ongoing 05/29/2022   Functional Gait Assessment >22/30 to indicate lower fall risk Baseline: SEE OBJECTIVE DATA Goal status: Ongoing 05/29/2022   Patient ambulates >500' with prosthesis only independently Baseline: SEE OBJECTIVE DATA Goal status: Ongoing 05/29/2022   Patient negotiates ramps, curbs & stairs with single rail with prosthesis only independently. Baseline: SEE OBJECTIVE DATA Goal status: Ongoing 05/29/2022   Patient demonstrates & verbalizes lifting, carrying, pushing, pulling with prosthesis only safely.  Baseline: SEE OBJECTIVE DATA Goal status:  Ongoing 05/29/2022     PLAN:   PT FREQUENCY: 3x/week for 5 weeks, then 1-2 x/wk for 10 weeks   PT DURATION: other: 15 weeks   PLANNED INTERVENTIONS: Therapeutic exercises, Therapeutic activity, Neuromuscular re-education, Balance training, Gait training, Patient/Family education, Self Care, Stair training, Vestibular training, Prosthetic training, DME instructions, and physical performance testing   PLAN FOR NEXT SESSION:  *** check STGs,  continue prosthetic gait without cane, balance activities working on [employer], PT, DPT 06/19/2022, 7:37 AM

## 2022-06-20 LAB — HEMOGLOBIN A1C
Est. average glucose Bld gHb Est-mCnc: 180 mg/dL
Hgb A1c MFr Bld: 7.9 % — ABNORMAL HIGH (ref 4.8–5.6)

## 2022-06-20 LAB — BRAIN NATRIURETIC PEPTIDE: BNP: 10.3 pg/mL (ref 0.0–100.0)

## 2022-06-24 ENCOUNTER — Ambulatory Visit (INDEPENDENT_AMBULATORY_CARE_PROVIDER_SITE_OTHER): Payer: 59 | Admitting: Physical Therapy

## 2022-06-24 ENCOUNTER — Encounter: Payer: Self-pay | Admitting: Physical Therapy

## 2022-06-24 DIAGNOSIS — M6281 Muscle weakness (generalized): Secondary | ICD-10-CM

## 2022-06-24 DIAGNOSIS — R2681 Unsteadiness on feet: Secondary | ICD-10-CM | POA: Diagnosis not present

## 2022-06-24 DIAGNOSIS — R2689 Other abnormalities of gait and mobility: Secondary | ICD-10-CM | POA: Diagnosis not present

## 2022-06-24 NOTE — Therapy (Signed)
OUTPATIENT PHYSICAL THERAPY TREATMENT NOTE   Patient Name: Raymond [name] MRN: [id number] DOB:10/25/1960, 62 y.o., male Today's Date: 06/24/2022  PCP: [name], FNP  REFERRING PROVIDER: Newt Minion, MD       END OF SESSION:   PT End of Session - 06/24/22 0810     Visit Number 18    Number of Visits 25    Date for PT Re-Evaluation 08/01/22    Authorization Type AET[employer]    Authorization Time Period NO COPAY, 35 PT/OT VISITS Calendar Year Benefit Date 05/26/2021 - 05/25/2022    Authorization - Number of Visits 35    Progress Note Due on Visit 20    PT Start Time 0809    PT Stop Time 0844    PT Time Calculation (min) 35 min    Equipment Utilized During Treatment Gait belt    Activity Tolerance Patient tolerated treatment well    Behavior During Therapy WFL for tasks assessed/performed                         Past Medical History:  Diagnosis Date   Diabetes ([place])    Diabetes mellitus without complication ([place])    type 2   ESRD (end stage renal disease) ([place])    03/09/2015- patient had a kidney transplant   Hypertension    Peripheral vascular disease ([place])    Renal disorder    Renal insufficiency    Past Surgical History:  Procedure Laterality Date   ABDOMINAL AORTOGRAM W/LOWER EXTREMITY N/A 09/11/2021   Procedure: ABDOMINAL AORTOGRAM W/LOWER EXTREMITY;  Surgeon: Wellington Hampshire, MD;  Location: [facility];  Service: Cardiovascular;  Laterality: N/A;   AMPUTATION Right 09/25/2021   Procedure: RIGHT TRANSMETATARSAL AMPUTATION AND APPLY TISSUE GRAFT;  Surgeon: Newt Minion, MD;  Location: [facility];  Service: Orthopedics;  Laterality: Right;   AMPUTATION Right 11/16/2021   Procedure: RIGHT AMPUTATION BELOW KNEE WITH WOUND VAC APPLICATION;  Surgeon: Newt Minion, MD;  Location: [place];  Service: Orthopedics;  Laterality: Right;   BONE BIOPSY Right 03/06/2021   Procedure: BONE BIOPSY;  Surgeon: Trula Slade, DPM;  Location: [facility];   Service: Podiatry;  Laterality: Right;   CENTRAL VENOUS CATHETER INSERTION Right 01/20/2020   Procedure: INSERTION CENTRAL LINE ADULT; Tunneled central line;  Surgeon: Virl Cagey, MD;  Location: [facility];  Service: General;  Laterality: Right;   COLONOSCOPY N/A 12/05/2014   NWG:[id number] external and internal hemorrhoid/mild diverticulosis/11 polyps removed   ESOPHAGOGASTRODUODENOSCOPY N/A 12/05/2014   MVH:QION duodenitis   GRAFT APPLICATION Right 62/95/2841   Procedure: GRAFT APPLICATION;  Surgeon: Trula Slade, DPM;  Location: [facility];  Service: Podiatry;  Laterality: Right;   INCISION AND DRAINAGE OF WOUND Right 08/15/2021   Procedure: IRRIGATION AND DEBRIDEMENT WOUND;  Surgeon: Landis Martins, DPM;  Location: [facility];  Service: Podiatry;  Laterality: Right;  need to make card not  a irrigation and debridement card   IR FLUORO GUIDE CV LINE RIGHT  03/01/2019   IR PERC TUN PERIT CATH WO PORT S&I /IMAG  06/27/2021   IR REMOVAL TUN CV CATH W/O FL  05/10/2019   IR REMOVAL TUN CV CATH W/O FL  02/29/2020   IR REMOVAL TUN CV CATH W/O FL  07/19/2021   IR US GUIDE VASC ACCESS RIGHT  03/01/2019   IR US GUIDE VASC ACCESS RIGHT  06/27/2021   IRRIGATION AND DEBRIDEMENT ELBOW Left 06/24/2021   Procedure: IRRIGATION  AND DEBRIDEMENT ELBOW;  Surgeon: Lovell Sheehan, MD;  Location: [facility];  Service: Orthopedics;  Laterality: Left;   KIDNEY TRANSPLANT  03/27/2015   Penile Pump Insertion     PERIPHERAL VASCULAR ATHERECTOMY  09/11/2021   Procedure: PERIPHERAL VASCULAR ATHERECTOMY;  Surgeon: Wellington Hampshire, MD;  Location: [facility];  Service: Cardiovascular;;  Shockwave Lithotripsy   TEE WITHOUT CARDIOVERSION N/A 01/17/2020   Procedure: TRANSESOPHAGEAL ECHOCARDIOGRAM (TEE) WITH PROPOFOL;  Surgeon: Arnoldo Lenis, MD;  [facility];  Service: Endoscopy;  Laterality: N/A;   WOUND DEBRIDEMENT Right 03/06/2021   Procedure: DEBRIDEMENT WOUND;  Surgeon: Trula Slade, DPM;   [facility];  Service: Podiatry;  Laterality: Right;   Patient Active Problem List   Diagnosis Date Noted   Acquired absence of right leg below knee ([place]) 06/18/2022   Chronic kidney disease, stage 5 ([place]) 06/18/2022   Cutaneous abscess of right foot    Hyperkalemia 08/28/2021   Obesity (BMI 30-39.9) 08/28/2021   History of anemia due to chronic kidney disease 08/28/2021   Diabetic foot ulcer ([place]) 08/14/2021   Hypomagnesemia 06/25/2021   Peripheral vascular disease ([place]) 04/24/2020   Thrombophlebitis of superficial veins of right lower extremity    Difficult intravenous access    MRSA bacteremia    Diabetic foot infection ([place])    Infected wound 01/12/2020   Critical lower limb ischemia ([place]) 02/02/2019   Personal history of noncompliance with medical treatment, presenting hazards to health 10/07/2018   Critical limb ischemia with history of revascularization of same extremity ([place]) 07/27/2018   Mixed hyperlipidemia 02/19/2018   History of renal transplant 02/19/2018   Conductive hearing loss, bilateral 08/31/2017   Nuclear sclerotic cataract of right eye 10/07/2016   Pseudophakia of left eye 10/07/2016   Hypertension due to endocrine disorder 03/04/2016   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 ([place]) 03/04/2016   Renal failure 03/04/2016   Proliferative diabetic retinopathy with macular edema associated with type 2 diabetes mellitus ([place]) 03/04/2016   Immunosuppression ([place]) 12/10/2015   Nausea vomiting and diarrhea 12/10/2015   Type 2 diabetes mellitus ([place]) 12/10/2015   Essential hypertension, benign 12/10/2015   Pancytopenia ([place]) 12/10/2015   Encounter for screening colonoscopy 11/15/2014   GERD (gastroesophageal reflux disease) 11/15/2014    REFERRING DIAG: V61.607P (ICD-10-CM) - Below-knee amputation of right lower extremit   ONSET DATE: 04/10/2022 prosthesis delivery   THERAPY DIAG:   Unsteadiness on feet  Other abnormalities of gait and mobility  Muscle weakness (generalized)  Rationale for Evaluation and Treatment Rehabilitation  PERTINENT HISTORY: DM2, PVD, ESRD s/p renal transplant 03/09/2015 on immunosuppressive meds, HTN   PRECAUTIONS: None  SUBJECTIVE:  SUBJECTIVE STATEMENT:  He had a flat tire & was able to change the tire himself.  He tried to climb a ladder which was a challenge.  Getting on the floor esp getting up is hard.   PAIN:  Are you having pain? Residual limb pain 0/10 soreness from wearing prosthesis without ability to seat limb into socket.   OBJECTIVE: (objective measures completed at initial evaluation unless otherwise dated) POSTURE: rounded shoulders, forward head, flexed trunk , and weight shift left   LOWER EXTREMITY ROM:   ROM P:passive  A:active Right eval Left eval  Hip flexion      Hip extension      Hip abduction      Hip adduction      Hip internal rotation      Hip external rotation      Knee flexion 100*    Knee extension -5*    Ankle dorsiflexion      Ankle plantarflexion      Ankle inversion      Ankle eversion       (Blank rows = not tested)   LOWER EXTREMITY MMT:   MMT Right eval Left eval  Hip flexion 3+/5    Hip extension 3-/5    Hip abduction 3-/5    Hip adduction      Hip internal rotation      Hip external rotation      Knee flexion 3-/5    Knee extension 3/5    Ankle dorsiflexion      Ankle plantarflexion      Ankle inversion      Ankle eversion      (Blank rows = not tested)   TRANSFERS: Sit to stand: 18" chair with armrests to RW with supervision.  Stand to sit: RW to 18" chair with armrests with supervision   PROSTHETIC GAIT WITH BKA PROSTHESIS: Gait pattern: step to pattern, decreased arm swing- Right,  decreased step length- Right, decreased stance time- Left, decreased stride length, decreased hip/knee flexion- Right, circumduction- Right, Right hip hike, knee flexed in stance- Right, antalgic, lateral hip instability, trunk flexed, and abducted- Right Distance walked: 20' with RW & 20' with cane Assistive device utilized: Single point cane, Walker - 2 wheeled, and BKA prosthesis Level of assistance: SBA with RW and Min A Gait velocity: 0.89 ft/sec Comments: excessive weight bearing on RW, partial weight on prosthesis   FUNCTIONAL TESTs:  06/24/2022: Merrilee Jansky Balance 46/56  [facility] PT Assessment - 06/24/22 0830       Standardized Balance Assessment   Standardized Balance Assessment Berg Balance Test      Berg Balance Test   Sit to Stand Able to stand without using hands and stabilize independently    Standing Unsupported Able to stand safely 2 minutes    Sitting with Back Unsupported but Feet Supported on Floor or Stool Able to sit safely and securely 2 minutes    Stand to Sit Sits safely with minimal use of hands    Transfers Able to transfer safely, minor use of hands    Standing Unsupported with Eyes Closed Able to stand 10 seconds safely    Standing Unsupported with Feet Together Able to place feet together independently and stand 1 minute safely    From Standing, Reach Forward with Outstretched Arm Can reach confidently >25 cm (10")    From Standing Position, Pick up Object from Floor Able to pick up shoe safely and easily    From Standing Position, Turn to  Look Behind Over each Shoulder Looks behind one side only/other side shows less weight shift    Turn 360 Degrees Able to turn 360 degrees safely but slowly    Standing Unsupported, Alternately Place Feet on Step/Stool Able to complete 4 steps without aid or supervision    Standing Unsupported, One Foot in Front Able to take small step independently and hold 30 seconds    Standing on One Leg Tries to lift leg/unable to hold 3 seconds  but remains standing independently    Total Score 46              05/12/2022:  Berg Balance test 42/56 TUG with prosthesis only: std 9.96sec & cog 10.94sec   [facility] PT Assessment - 06/24/22 0830       Standardized Balance Assessment   Standardized Balance Assessment Berg Balance Test      Berg Balance Test   Sit to Stand Able to stand without using hands and stabilize independently    Standing Unsupported Able to stand safely 2 minutes    Sitting with Back Unsupported but Feet Supported on Floor or Stool Able to sit safely and securely 2 minutes    Stand to Sit Sits safely with minimal use of hands    Transfers Able to transfer safely, minor use of hands    Standing Unsupported with Eyes Closed Able to stand 10 seconds safely    Standing Unsupported with Feet Together Able to place feet together independently and stand 1 minute safely    From Standing, Reach Forward with Outstretched Arm Can reach confidently >25 cm (10")    From Standing Position, Pick up Object from Floor Able to pick up shoe safely and easily    From Standing Position, Turn to Look Behind Over each Shoulder Looks behind one side only/other side shows less weight shift    Turn 360 Degrees Able to turn 360 degrees safely but slowly    Standing Unsupported, Alternately Place Feet on Step/Stool Able to complete 4 steps without aid or supervision    Standing Unsupported, One Foot in Front Able to take small step independently and hold 30 seconds    Standing on One Leg Tries to lift leg/unable to hold 3 seconds but remains standing independently    Total Score 46            04/15/2022: Timed up and go (TUG): 39.25sec with cane & BKA prosthesis Berg Balance Scale: 24/56     CURRENT PROSTHETIC WEAR ASSESSMENT: Patient is dependent with: skin check, residual limb care, care of non-amputated limb, prosthetic cleaning, ply sock cleaning, correct ply sock adjustment, proper wear schedule/adjustment, and proper  weight-bearing schedule/adjustment Donning prosthesis: Min A Doffing prosthesis: SBA Prosthetic wear tolerance: 3 hours, 1-2x/day, 5 of 5 days since delivery Prosthetic weight bearing tolerance: 5 minutes Edema: pitting. Residual limb condition: 59m scab wound on medial incision, dry skin, normal color & temperature, cylindrical shape Prosthetic description: silicon liner with pin lock suspension, total contact socket, hydraulic ankle / K3 foot K code/activity level with prosthetic use: Level 3       TODAY'S TREATMENT:  DATE:  06/24/2022: Prosthetic Training with TTA prosthesis: PT demo & verbal cues on floor transfer via half kneeling onto LLE. Pt return demo understanding 3 reps. PT recommended performing 5 reps per day. Pt verbalized understanding. Lunges between 2 chairs for UE support: leading ea LE 5 reps 2 sets.  Pt return demo & verbalized understanding as HEP. PT demo & verbal cues on climbing A-frame & reaching 2 hands for simulated task. Pt climbed 3 rungs 2 reps with supervision.  Pt return demo & verbalized understanding as HEP. Push/pull walking forward/back 30# cable wt (15# per stack) 5 reps wt anterior & 5 reps wt posterior. 20# side stepping 5 reps. Needed cues for controlled to left with wts pulling him.  Push & pull in straddle step with wt shift 30# (15# per UE) wt posterior with forward reach 5 reps & anterior with row 5 reps.    06/17/2022: Self-Care: PT educated / discussed low blood glucose relationship to fall risk & wound healing.  PT also educated on issues with wearing old shoes with wear patterns.  It can exaggerate any issues of prosthetic leans & cause breakdown of left foot.  PT educated on features of proper shoe for diabetic. Pt & wife verbalized understanding.   Prosthetic Training with TTA prosthesis: Pre-gait in //bars using  mirror, demo, verbal & tactile cues for right trunk elongation in stance and wt shift over prosthesis with pelvis not lateral trunk lean.    Rocker board right/midline/left   RLE stance stepping LLE forward & back - he improved control forward but limited back step.  Progressed from BUE support to RUE support.  Amb in //bars forward / backward with PT manual cues to limit shoulder / upper body lean.  Sidestepping with focus on RLE stance / trunk elongation Pt educated on performing above last 3 activities at home near counter on right side. Pt verbalized understanding.   Pt amb 250' & 90' with tactile & verbal cues for carryover of above.    06/02/2022: Prosthetic Training with TTA prosthesis: Pre-gait in //bars in step position working on wt shift over prosthesis without hip or trunk collapse. Progressed to gait in//bars with carryover. Walking in bars without UE support retro and sideways 4 round trips Stepping over 2 black bolsters with only intermitt UE support PRN stepping over with Rt leg first Dynamic balance with gait: (gait belt and CGA with this) walking with head turns 40 feet, head nods 40 feet, eyes closed 40 feet X 3 4 square balance X 5 CW and X 5 CCW (gait belt and CGA with this) Gait working on arm swing with counter trunk rotation with pt/PT holding pipes. Verbal cues for pelvic wt shift over prosthesis in stance. Red theraband 2X 10 each for bilat hip abduction and bilat hip extension with UE support Gait 300 feet with supervision working on carry over for arm swing and trunk control.     HOME EXERCISE PROGRAM: Access Code: [facility] URL: [email or web address] Date: 05/22/2022 Prepared by: Jamey Reas  Exercises - Hip Flexor Stretch at [facility] of Bed  - 1-3 x daily - 7 x weekly - 1 sets - 3 reps - 20-30 seconds hold - Supine Quadriceps Stretch with Strap on Table  - 1-3 x daily - 7 x weekly - 1 sets - 3 reps - 20-30 seconds hold - Supine Bridge  - 1 x  daily - 7 x weekly - 2-3 sets - 10 reps - 5 seconds hold - Marching Bridge  -  1 x daily - 7 x weekly - 2-3 sets - 10 reps - 5 seconds hold - Standing Terminal Knee Extension with Resistance  - 1 x daily - 7 x weekly - 2-3 sets - 10 reps - 5 seconds hold - Standing Hip Flexion with Resistance (Mirrored)  - 1 x daily - 7 x weekly - 1 sets - 10 reps - Standing Hip Adduction with Resistance (Mirrored)  - 1 x daily - 7 x weekly - 1 sets - 10 reps - Standing Hip Extension with Resistance  - 1 x daily - 7 x weekly - 1 sets - 10 reps - Standing Hip Abduction with Theraband Resistance  - 1 x daily - 7 x weekly - 1 sets - 10 reps - Standing Tandem Balance with Counter Support  - 1 x daily - 7 x weekly - 1 sets - 3-4 reps - 30 seconds hold - Tandem Walking with Counter Support  - 1 x daily - 7 x weekly - 1 sets - 6 reps - single leg stance with one foot in lower cabinet  - 1 x daily - 7 x weekly - 1 sets - 5-10 reps - 10 seconds hold - [place] with Counter Support  - 1 x daily - 7 x weekly - 1 sets - 10 reps - Alternating Punch with Resistance  - 1 x daily - 5 x weekly - 1 sets - 10 reps - 5 seconds hold - Standing Scapular Protraction with Resistance  - 1 x daily - 7 x weekly - 1 sets - 10 reps - 5 seconds hold - Standing alternate rows with resistance  - 1 x daily - 7 x weekly - 1 sets - 10 reps - 5 seconds hold - Standing Row with Anchored Resistance  - 1 x daily - 7 x weekly - 1 sets - 10 reps - 5 seconds hold - Alternating elbow flexion with resistance  - 1 x daily - 7 x weekly - 1 sets - 10 reps - 5 seconds hold - Standing Bicep Curls with Resistance  - 1 x daily - 7 x weekly - 1 sets - 10 reps - 5 seconds hold   Do each exercise 1-2  times per day Do each exercise 5-10 repetitions Hold each exercise for 2 seconds to feel your location  AT [place].  Try to find this position when standing still for activities.   USE TAPE ON  FLOOR TO MARK THE MIDLINE POSITION which is even with middle of sink.  You also should try to feel with your limb pressure in socket.  You are trying to feel with limb what you used to feel with the bottom of your foot.  Side to Side Shift: Moving your hips only (not shoulders): move weight onto your left leg, HOLD/FEEL pressure in socket.  Move back to equal weight on each leg, HOLD/FEEL pressure in socket. Move weight onto your right leg, HOLD/FEEL pressure in socket. Move back to equal weight on each leg, HOLD/FEEL pressure in socket. Repeat.  Start with both hands on sink, progress to hand on prosthetic side only, then no hands.  Front to Back Shift: Moving your hips only (not shoulders): move your weight forward onto your toes, HOLD/FEEL pressure in socket. Move your weight back to equal Flat Foot on both legs, HOLD/FEEL  pressure in socket. Move your weight back onto your heels, HOLD/FEEL  pressure in  socket. Move your weight back to equal on both legs, HOLD/FEEL  pressure in socket. Repeat.  Start with both hands on sink, progress to hand on prosthetic side only, then no hands.  Moving Cones / Cups: With equal weight on each leg: Hold on with one hand the first time, then progress to no hand supports. Move cups from one side of sink to the other. Place cups ~2" out of your reach, progress to 10" beyond reach.  Place one hand in middle of sink and reach with other hand. Do both arms.  Then hover one hand and move cups with other hand.  Overhead/Upward Reaching: alternated reaching up to top cabinets or ceiling if no cabinets present. Keep equal weight on each leg. Start with one hand support on counter while other hand reaches and progress to no hand support with reaching.  ace one hand in middle of sink and reach with other hand. Do both arms.  Then hover one hand and move cups with other hand.  5.   Looking Over Shoulders: With equal weight on each leg: alternate turning to look over your shoulders  with one hand support on counter as needed.  Start with head motions only to look in front of shoulder, then even with shoulder and progress to looking behind you. To look to side, move head /eyes, then shoulder on side looking pulls back, shift more weight to side looking and pull hip back. Place one hand in middle of sink and let go with other hand so your shoulder can pull back. Switch hands to look other way.   Then hover one hand and look over shoulder. If looking right, use left hand at sink. If looking left, use right hand at sink. 6.  Stepping with leg that is not amputated:  Move items under cabinet out of your way. Shift your hips/pelvis so weight on prosthesis. Tighten muscles in hip on prosthetic side.  SLOWLY step other leg so front of foot is in cabinet. Then step back to floor.      ASSESSMENT:   CLINICAL IMPRESSION: Pt improved understanding for floor transfer, lunge & climbing ladder with prosthesis.  He understands as HEP to build strength & practice form.  PT also added push & pull activities to facilitate power thru prosthesis & function.  Berg Balance 46/56 indicates lower fall risk.  OBJECTIVE IMPAIRMENTS: Abnormal gait, decreased activity tolerance, decreased balance, decreased endurance, decreased knowledge of condition, decreased knowledge of use of DME, decreased mobility, decreased ROM, decreased strength, increased edema, impaired flexibility, postural dysfunction, prosthetic dependency , obesity, and wound on residual limb .    ACTIVITY LIMITATIONS: carrying, lifting, bending, standing, squatting, stairs, transfers, locomotion level, and prosthetic use   PARTICIPATION LIMITATIONS: meal prep, cleaning, laundry, driving, community activity, occupation, and yard work   PERSONAL FACTORS: Fitness, Time since onset of injury/illness/exacerbation, and 3+ comorbidities: see PMH  are also affecting patient's functional outcome.    REHAB POTENTIAL: Good   CLINICAL DECISION  MAKING: Evolving/moderate complexity   EVALUATION COMPLEXITY: Moderate     GOALS: Goals reviewed with patient? Yes   SHORT TERM GOALS: Target date: 06/20/2022   Patient verbalizes how to adjust ply socks. Baseline: SEE OBJECTIVE DATA Goal status: MET 06/24/2022 2.  Patient tolerates prosthesis >90% awake hrs /day without skin issues or limb pain after standing. Baseline: SEE OBJECTIVE DATA Goal status:  MET 06/17/2022   3. Berg Balance >45/56 Baseline: SEE OBJECTIVE DATA Goal status:  Ongoing 05/29/2022  4. Patient ambulates 400' with prosthesis with supervision. Baseline: SEE OBJECTIVE DATA Goal status:  MET 06/24/2022   5. Patient negotiates ramps & curbs with prosthesis with supervision Baseline: SEE OBJECTIVE DATA Goal status:  MET 06/24/2022   LONG TERM GOALS: Target date: 07/31/2021   Patient demonstrates & verbalized understanding of prosthetic care to enable safe utilization of prosthesis. Baseline: SEE OBJECTIVE DATA Goal status: Ongoing 05/29/2022   Patient tolerates prosthesis wear >90% of awake hours without skin or limb pain issues. Baseline: SEE OBJECTIVE DATA Goal status: Ongoing 05/29/2022   Functional Gait Assessment >22/30 to indicate lower fall risk Baseline: SEE OBJECTIVE DATA Goal status: Ongoing 05/29/2022   Patient ambulates >500' with prosthesis only independently Baseline: SEE OBJECTIVE DATA Goal status: Ongoing 05/29/2022   Patient negotiates ramps, curbs & stairs with single rail with prosthesis only independently. Baseline: SEE OBJECTIVE DATA Goal status: Ongoing 05/29/2022   Patient demonstrates & verbalizes lifting, carrying, pushing, pulling with prosthesis only safely. Baseline: SEE OBJECTIVE DATA Goal status:  Ongoing 05/29/2022     PLAN:   PT FREQUENCY: 3x/week for 5 weeks, then 1-2 x/wk for 10 weeks   PT DURATION: other: 15 weeks   PLANNED INTERVENTIONS: Therapeutic exercises, Therapeutic activity, Neuromuscular re-education, Balance  training, Gait training, Patient/Family education, Self Care, Stair training, Vestibular training, Prosthetic training, DME instructions, and physical performance testing   PLAN FOR NEXT SESSION:  continue push/pull with pulley weights, check on floor transfers, lunge & ladder, balance activities working on [employer], PT, DPT 06/24/2022, 11:45 AM

## 2022-06-26 ENCOUNTER — Ambulatory Visit (INDEPENDENT_AMBULATORY_CARE_PROVIDER_SITE_OTHER): Payer: 59 | Admitting: Physical Therapy

## 2022-06-26 ENCOUNTER — Encounter: Payer: Self-pay | Admitting: Physical Therapy

## 2022-06-26 DIAGNOSIS — R2689 Other abnormalities of gait and mobility: Secondary | ICD-10-CM

## 2022-06-26 DIAGNOSIS — R2681 Unsteadiness on feet: Secondary | ICD-10-CM | POA: Diagnosis not present

## 2022-06-26 DIAGNOSIS — M6281 Muscle weakness (generalized): Secondary | ICD-10-CM

## 2022-06-26 DIAGNOSIS — S81801A Unspecified open wound, right lower leg, initial encounter: Secondary | ICD-10-CM | POA: Diagnosis not present

## 2022-06-26 NOTE — Therapy (Signed)
OUTPATIENT PHYSICAL THERAPY TREATMENT NOTE   Patient Name: Raymond [name] MRN: [id number] DOB:January 19, 1961, 62 y.o., male Today's Date: 06/26/2022  PCP: [name], FNP  REFERRING PROVIDER: Newt Minion, MD       END OF SESSION:   PT End of Session - 06/26/22 0800     Visit Number 19    Number of Visits 25    Date for PT Re-Evaluation 08/01/22    Authorization Type AET[employer]    Authorization Time Period NO COPAY, 35 PT/OT VISITS Calendar Year Benefit Date 05/26/2021 - 05/25/2022    Authorization - Number of Visits 35    Progress Note Due on Visit 20    PT Start Time 0800    PT Stop Time 0840    PT Time Calculation (min) 40 min    Equipment Utilized During Treatment Gait belt    Activity Tolerance Patient tolerated treatment well    Behavior During Therapy WFL for tasks assessed/performed                          Past Medical History:  Diagnosis Date   Diabetes ([place])    Diabetes mellitus without complication ([place])    type 2   ESRD (end stage renal disease) ([place])    03/09/2015- patient had a kidney transplant   Hypertension    Peripheral vascular disease ([place])    Renal disorder    Renal insufficiency    Past Surgical History:  Procedure Laterality Date   ABDOMINAL AORTOGRAM W/LOWER EXTREMITY N/A 09/11/2021   Procedure: ABDOMINAL AORTOGRAM W/LOWER EXTREMITY;  Surgeon: Wellington Hampshire, MD;  Location: [facility];  Service: Cardiovascular;  Laterality: N/A;   AMPUTATION Right 09/25/2021   Procedure: RIGHT TRANSMETATARSAL AMPUTATION AND APPLY TISSUE GRAFT;  Surgeon: Newt Minion, MD;  Location: [facility];  Service: Orthopedics;  Laterality: Right;   AMPUTATION Right 11/16/2021   Procedure: RIGHT AMPUTATION BELOW KNEE WITH WOUND VAC APPLICATION;  Surgeon: Newt Minion, MD;  Location: [facility];  Service: Orthopedics;  Laterality: Right;   BONE BIOPSY Right 03/06/2021   Procedure: BONE BIOPSY;  Surgeon: Trula Slade, DPM;  Location: [facility];   Service: Podiatry;  Laterality: Right;   CENTRAL VENOUS CATHETER INSERTION Right 01/20/2020   Procedure: INSERTION CENTRAL LINE ADULT; Tunneled central line;  Surgeon: Virl Cagey, MD;  Location: [facility];  Service: General;  Laterality: Right;   COLONOSCOPY N/A 12/05/2014   YSA:YTKZSWFU external and internal hemorrhoid/mild diverticulosis/11 polyps removed   ESOPHAGOGASTRODUODENOSCOPY N/A 12/05/2014   XNA:TFTD duodenitis   GRAFT APPLICATION Right 32/20/2542   Procedure: GRAFT APPLICATION;  Surgeon: Trula Slade, DPM;  [facility]: [facility];  Service: Podiatry;  Laterality: Right;   INCISION AND DRAINAGE OF WOUND Right 08/15/2021   Procedure: IRRIGATION AND DEBRIDEMENT WOUND;  Surgeon: Landis Martins, DPM;  Location: [facility];  Service: Podiatry;  Laterality: Right;  need to make card not  a irrigation and debridement card   IR FLUORO GUIDE CV LINE RIGHT  03/01/2019   IR PERC TUN PERIT CATH WO PORT S&I /IMAG  06/27/2021   IR REMOVAL TUN CV CATH W/O FL  05/10/2019   IR REMOVAL TUN CV CATH W/O FL  02/29/2020   IR REMOVAL TUN CV CATH W/O FL  07/19/2021   IR US GUIDE VASC ACCESS RIGHT  03/01/2019   IR US GUIDE VASC ACCESS RIGHT  06/27/2021   IRRIGATION AND DEBRIDEMENT ELBOW Left 06/24/2021   Procedure:  IRRIGATION AND DEBRIDEMENT ELBOW;  Surgeon: Lovell Sheehan, MD;  Location: [facility];  Service: Orthopedics;  Laterality: Left;   KIDNEY TRANSPLANT  03/27/2015   Penile Pump Insertion     PERIPHERAL VASCULAR ATHERECTOMY  09/11/2021   Procedure: PERIPHERAL VASCULAR ATHERECTOMY;  Surgeon: Wellington Hampshire, MD;  Location: [facility];  Service: Cardiovascular;;  Shockwave Lithotripsy   TEE WITHOUT CARDIOVERSION N/A 01/17/2020   Procedure: TRANSESOPHAGEAL ECHOCARDIOGRAM (TEE) WITH PROPOFOL;  Surgeon: Arnoldo Lenis, MD;  [facility];  Service: Endoscopy;  Laterality: N/A;   WOUND DEBRIDEMENT Right 03/06/2021   Procedure: DEBRIDEMENT WOUND;  Surgeon: Trula Slade, DPM;   Location[facility];  Service: Podiatry;  Laterality: Right;   Patient Active Problem List   Diagnosis Date Noted   Acquired absence of right leg below knee ([place]) 06/18/2022   Chronic kidney disease, stage 5 ([place]) 06/18/2022   Cutaneous abscess of right foot    Hyperkalemia 08/28/2021   Obesity (BMI 30-39.9) 08/28/2021   History of anemia due to chronic kidney disease 08/28/2021   Diabetic foot ulcer ([place]) 08/14/2021   Hypomagnesemia 06/25/2021   Peripheral vascular disease ([place]) 04/24/2020   Thrombophlebitis of superficial veins of right lower extremity    Difficult intravenous access    MRSA bacteremia    Diabetic foot infection ([place])    Infected wound 01/12/2020   Critical lower limb ischemia ([place]) 02/02/2019   Personal history of noncompliance with medical treatment, presenting hazards to health 10/07/2018   Critical limb ischemia with history of revascularization of same extremity ([place]) 07/27/2018   Mixed hyperlipidemia 02/19/2018   History of renal transplant 02/19/2018   Conductive hearing loss, bilateral 08/31/2017   Nuclear sclerotic cataract of right eye 10/07/2016   Pseudophakia of left eye 10/07/2016   Hypertension due to endocrine disorder 03/04/2016   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 ([place]) 03/04/2016   Renal failure 03/04/2016   Proliferative diabetic retinopathy with macular edema associated with type 2 diabetes mellitus ([place]) 03/04/2016   Immunosuppression ([place]) 12/10/2015   Nausea vomiting and diarrhea 12/10/2015   Type 2 diabetes mellitus ([place]) 12/10/2015   Essential hypertension, benign 12/10/2015   Pancytopenia ([place]) 12/10/2015   Encounter for screening colonoscopy 11/15/2014   GERD (gastroesophageal reflux disease) 11/15/2014    REFERRING DIAG: M38.466Z (ICD-10-CM) - Below-knee amputation of right lower extremit   ONSET DATE: 04/10/2022 prosthesis delivery   THERAPY DIAG:   Unsteadiness on feet  Other abnormalities of gait and mobility  Muscle weakness (generalized)  Wound of right lower extremity, initial encounter  Rationale for Evaluation and Treatment Rehabilitation  PERTINENT HISTORY: DM2, PVD, ESRD s/p renal transplant 03/09/2015 on immunosuppressive meds, HTN   PRECAUTIONS: None  SUBJECTIVE:  SUBJECTIVE STATEMENT: He had expected muscle soreness from new activity.  He fell asleep early due to fatigue and forgot to put shrinker on limb so he had to go down to 0-ply to wear prosthesis.   PAIN:  Are you having pain? Residual limb pain  0/10 soreness from wearing prosthesis without ability to seat limb into socket.   OBJECTIVE: (objective measures completed at initial evaluation unless otherwise dated) POSTURE: rounded shoulders, forward head, flexed trunk , and weight shift left   LOWER EXTREMITY ROM:   ROM P:passive  A:active Right eval Left eval  Hip flexion      Hip extension      Hip abduction      Hip adduction      Hip internal rotation      Hip external rotation      Knee flexion 100*    Knee extension -5*    Ankle dorsiflexion      Ankle plantarflexion      Ankle inversion      Ankle eversion       (Blank rows = not tested)   LOWER EXTREMITY MMT:   MMT Right eval Left eval  Hip flexion 3+/5    Hip extension 3-/5    Hip abduction 3-/5    Hip adduction      Hip internal rotation      Hip external rotation      Knee flexion 3-/5    Knee extension 3/5    Ankle dorsiflexion      Ankle plantarflexion      Ankle inversion      Ankle eversion      (Blank rows = not tested)   TRANSFERS: Sit to stand: 18" chair with armrests to RW with supervision.  Stand to sit: RW to 18" chair with armrests with supervision   PROSTHETIC GAIT WITH BKA  PROSTHESIS: Gait pattern: step to pattern, decreased arm swing- Right, decreased step length- Right, decreased stance time- Left, decreased stride length, decreased hip/knee flexion- Right, circumduction- Right, Right hip hike, knee flexed in stance- Right, antalgic, lateral hip instability, trunk flexed, and abducted- Right Distance walked: 40' with RW & 20' with cane Assistive device utilized: Single point cane, Walker - 2 wheeled, and BKA prosthesis Level of assistance: SBA with RW and Min A Gait velocity: 0.89 ft/sec Comments: excessive weight bearing on RW, partial weight on prosthesis   FUNCTIONAL TESTs:  06/24/2022: Merrilee Jansky Balance 46/56     05/12/2022:  Merrilee Jansky Balance test 42/56 TUG with prosthesis only: std 9.96sec & cog 10.94sec    04/15/2022: Timed up and go (TUG): 39.25sec with cane & BKA prosthesis Berg Balance Scale: 24/56     CURRENT PROSTHETIC WEAR ASSESSMENT: Patient is dependent with: skin check, residual limb care, care of non-amputated limb, prosthetic cleaning, ply sock cleaning, correct ply sock adjustment, proper wear schedule/adjustment, and proper weight-bearing schedule/adjustment Donning prosthesis: Min A Doffing prosthesis: SBA Prosthetic wear tolerance: 3 hours, 1-2x/day, 5 of 5 days since delivery Prosthetic weight bearing tolerance: 5 minutes Edema: pitting. Residual limb condition: 53m scab wound on medial incision, dry skin, normal color & temperature, cylindrical shape Prosthetic description: silicon liner with pin lock suspension, total contact socket, hydraulic ankle / K3 foot K code/activity level with prosthetic use: Level 3       TODAY'S TREATMENT:  DATE:  06/26/2022: Prosthetic Training with TTA prosthesis: Pt able to demo floor transfer pushing with UEs on chair seat.  Lunges between 2 chairs for UE support: leading  ea LE 5 reps 2 sets.  Minor cues to increase LE involvement.  Pt climbed A-frame ladder to 3rd rung & simulated task standing on ladder with minimal cues. Progressed to carry 5# wt in single UE. Pt more stable & prefers wt in LUE. Push/pull walking forward/back 30# cable wt (15# per stack) 5 reps wt anterior & 5 reps wt posterior. 20# side stepping 10 reps.  Push & pull in straddle step with wt shift 40# (20# per UE) wt posterior with forward reach 10 reps & anterior with row 10 reps.  Lifting 15# kettle bell & carry 100' (50' in [place] 84' in LUE) 2 reps.  Lifting 18# & 38# box & carrying 100' with BUEs.   PT recommended setting timer for 1 hour and standing 15-20 min ea hour for 12 hrs /day to build stamina.  Pt verbalized understanding.    06/24/2022: Prosthetic Training with TTA prosthesis: PT demo & verbal cues on floor transfer via half kneeling onto LLE. Pt return demo understanding 3 reps. PT recommended performing 5 reps per day. Pt verbalized understanding. Lunges between 2 chairs for UE support: leading ea LE 5 reps 2 sets.  Pt return demo & verbalized understanding as HEP. PT demo & verbal cues on climbing A-frame & reaching 2 hands for simulated task. Pt climbed 3 rungs 2 reps with supervision.  Pt return demo & verbalized understanding as HEP. Push/pull walking forward/back 30# cable wt (15# per stack) 5 reps wt anterior & 5 reps wt posterior. 20# side stepping 5 reps. Needed cues for controlled to left with wts pulling him.  Push & pull in straddle step with wt shift 30# (15# per UE) wt posterior with forward reach 5 reps & anterior with row 5 reps.    06/17/2022: Self-Care: PT educated / discussed low blood glucose relationship to fall risk & wound healing.  PT also educated on issues with wearing old shoes with wear patterns.  It can exaggerate any issues of prosthetic leans & cause breakdown of left foot.  PT educated on features of proper shoe for diabetic. Pt & wife verbalized  understanding.   Prosthetic Training with TTA prosthesis: Pre-gait in //bars using mirror, demo, verbal & tactile cues for right trunk elongation in stance and wt shift over prosthesis with pelvis not lateral trunk lean.    Rocker board right/midline/left   RLE stance stepping LLE forward & back - he improved control forward but limited back step.  Progressed from BUE support to RUE support.  Amb in //bars forward / backward with PT manual cues to limit shoulder / upper body lean.  Sidestepping with focus on RLE stance / trunk elongation Pt educated on performing above last 3 activities at home near counter on right side. Pt verbalized understanding.   Pt amb 250' & 90' with tactile & verbal cues for carryover of above.    HOME EXERCISE PROGRAM: Access Code: [facility] URL: [email or web address] Date: 05/22/2022 Prepared by: Jamey Reas  Exercises - Hip Flexor Stretch at [facility] of Bed  - 1-3 x daily - 7 x weekly - 1 sets - 3 reps - 20-30 seconds hold - Supine Quadriceps Stretch with Strap on Table  - 1-3 x daily - 7 x weekly - 1 sets - 3 reps - 20-30 seconds hold - Supine Bridge  -  1 x daily - 7 x weekly - 2-3 sets - 10 reps - 5 seconds hold - Marching Bridge  - 1 x daily - 7 x weekly - 2-3 sets - 10 reps - 5 seconds hold - Standing Terminal Knee Extension with Resistance  - 1 x daily - 7 x weekly - 2-3 sets - 10 reps - 5 seconds hold - Standing Hip Flexion with Resistance (Mirrored)  - 1 x daily - 7 x weekly - 1 sets - 10 reps - Standing Hip Adduction with Resistance (Mirrored)  - 1 x daily - 7 x weekly - 1 sets - 10 reps - Standing Hip Extension with Resistance  - 1 x daily - 7 x weekly - 1 sets - 10 reps - Standing Hip Abduction with Theraband Resistance  - 1 x daily - 7 x weekly - 1 sets - 10 reps - Standing Tandem Balance with Counter Support  - 1 x daily - 7 x weekly - 1 sets - 3-4 reps - 30 seconds hold - Tandem Walking with Counter Support  - 1 x daily - 7 x weekly -  1 sets - 6 reps - single leg stance with one foot in lower cabinet  - 1 x daily - 7 x weekly - 1 sets - 5-10 reps - 10 seconds hold - Carioca with Counter Support  - 1 x daily - 7 x weekly - 1 sets - 10 reps - Alternating Punch with Resistance  - 1 x daily - 5 x weekly - 1 sets - 10 reps - 5 seconds hold - Standing Scapular Protraction with Resistance  - 1 x daily - 7 x weekly - 1 sets - 10 reps - 5 seconds hold - Standing alternate rows with resistance  - 1 x daily - 7 x weekly - 1 sets - 10 reps - 5 seconds hold - Standing Row with Anchored Resistance  - 1 x daily - 7 x weekly - 1 sets - 10 reps - 5 seconds hold - Alternating elbow flexion with resistance  - 1 x daily - 7 x weekly - 1 sets - 10 reps - 5 seconds hold - Standing Bicep Curls with Resistance  - 1 x daily - 7 x weekly - 1 sets - 10 reps - 5 seconds hold   Do each exercise 1-2  times per day Do each exercise 5-10 repetitions Hold each exercise for 2 seconds to feel your location  AT [place].  Try to find this position when standing still for activities.   USE TAPE ON FLOOR TO MARK THE MIDLINE POSITION which is even with middle of sink.  You also should try to feel with your limb pressure in socket.  You are trying to feel with limb what you used to feel with the bottom of your foot.  Side to Side Shift: Moving your hips only (not shoulders): move weight onto your left leg, HOLD/FEEL pressure in socket.  Move back to equal weight on each leg, HOLD/FEEL pressure in socket. Move weight onto your right leg, HOLD/FEEL pressure in socket. Move back to equal weight on each leg, HOLD/FEEL pressure in socket. Repeat.  Start with both hands on sink, progress to hand on prosthetic side only, then no hands.  Front to Back Shift: Moving your hips only (not shoulders): move your weight forward onto your toes, HOLD/FEEL pressure in socket. Move your weight back to  equal Flat  Foot on both legs, HOLD/FEEL  pressure in socket. Move your weight back onto your heels, HOLD/FEEL  pressure in socket. Move your weight back to equal on both legs, HOLD/FEEL  pressure in socket. Repeat.  Start with both hands on sink, progress to hand on prosthetic side only, then no hands.  Moving Cones / Cups: With equal weight on each leg: Hold on with one hand the first time, then progress to no hand supports. Move cups from one side of sink to the other. Place cups ~2" out of your reach, progress to 10" beyond reach.  Place one hand in middle of sink and reach with other hand. Do both arms.  Then hover one hand and move cups with other hand.  Overhead/Upward Reaching: alternated reaching up to top cabinets or ceiling if no cabinets present. Keep equal weight on each leg. Start with one hand support on counter while other hand reaches and progress to no hand support with reaching.  ace one hand in middle of sink and reach with other hand. Do both arms.  Then hover one hand and move cups with other hand.  5.   Looking Over Shoulders: With equal weight on each leg: alternate turning to look over your shoulders with one hand support on counter as needed.  Start with head motions only to look in front of shoulder, then even with shoulder and progress to looking behind you. To look to side, move head /eyes, then shoulder on side looking pulls back, shift more weight to side looking and pull hip back. Place one hand in middle of sink and let go with other hand so your shoulder can pull back. Switch hands to look other way.   Then hover one hand and look over shoulder. If looking right, use left hand at sink. If looking left, use right hand at sink. 6.  Stepping with leg that is not amputated:  Move items under cabinet out of your way. Shift your hips/pelvis so weight on prosthesis. Tighten muscles in hip on prosthetic side.  SLOWLY step other leg so front of foot is in cabinet. Then step back to floor.       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  He needs to build strength & endurance.  Pt continues to benefit from skilled PT.  OBJECTIVE IMPAIRMENTS: Abnormal gait, decreased activity tolerance, decreased balance, decreased endurance, decreased knowledge of condition, decreased knowledge of use of DME, decreased mobility, decreased ROM, decreased strength, increased edema, impaired flexibility, postural dysfunction, prosthetic dependency , obesity, and wound on residual limb .    ACTIVITY LIMITATIONS: carrying, lifting, bending, standing, squatting, stairs, transfers, locomotion level, and prosthetic use   PARTICIPATION LIMITATIONS: meal prep, cleaning, laundry, driving, community activity, occupation, and yard work   PERSONAL FACTORS: Fitness, Time since onset of injury/illness/exacerbation, and 3+ comorbidities: see PMH  are also affecting patient's functional outcome.    REHAB POTENTIAL: Good   CLINICAL DECISION MAKING: Evolving/moderate complexity   EVALUATION COMPLEXITY: Moderate     GOALS: Goals reviewed with patient? Yes   SHORT TERM GOALS: Target date: 06/20/2022   Patient verbalizes how to adjust ply socks. Baseline: SEE OBJECTIVE DATA Goal status: MET 06/24/2022 2.  Patient tolerates prosthesis >90% awake hrs /day without skin issues or limb pain after standing. Baseline: SEE OBJECTIVE DATA Goal status:  MET 06/17/2022   3. Berg Balance >45/56 Baseline: SEE OBJECTIVE DATA Goal status:  Ongoing 05/29/2022  4. Patient ambulates 400' with prosthesis with supervision. Baseline: SEE OBJECTIVE DATA Goal status:  MET 06/24/2022   5. Patient negotiates ramps & curbs with prosthesis with supervision Baseline: SEE OBJECTIVE DATA Goal status:  MET 06/24/2022   LONG TERM GOALS: Target date: 07/31/2021   Patient demonstrates & verbalized understanding of prosthetic care to enable safe utilization of  prosthesis. Baseline: SEE OBJECTIVE DATA Goal status: Ongoing 05/29/2022   Patient tolerates prosthesis wear >90% of awake hours without skin or limb pain issues. Baseline: SEE OBJECTIVE DATA Goal status: Ongoing 05/29/2022   Functional Gait Assessment >22/30 to indicate lower fall risk Baseline: SEE OBJECTIVE DATA Goal status: Ongoing 05/29/2022   Patient ambulates >500' with prosthesis only independently Baseline: SEE OBJECTIVE DATA Goal status: Ongoing 05/29/2022   Patient negotiates ramps, curbs & stairs with single rail with prosthesis only independently. Baseline: SEE OBJECTIVE DATA Goal status: Ongoing 05/29/2022   Patient demonstrates & verbalizes lifting, carrying, pushing, pulling with prosthesis only safely. Baseline: SEE OBJECTIVE DATA Goal status:  Ongoing 05/29/2022     PLAN:   PT FREQUENCY: 3x/week for 5 weeks, then 1-2 x/wk for 10 weeks   PT DURATION: other: 15 weeks   PLANNED INTERVENTIONS: Therapeutic exercises, Therapeutic activity, Neuromuscular re-education, Balance training, Gait training, Patient/Family education, Self Care, Stair training, Vestibular training, Prosthetic training, DME instructions, and physical performance testing   PLAN FOR NEXT SESSION:  continue work simulation,   continue push/pull with pulley weights,   Jamey Reas, PT, DPT 06/26/2022, 9:17 AM

## 2022-06-30 ENCOUNTER — Encounter: Payer: Self-pay | Admitting: Physical Therapy

## 2022-06-30 ENCOUNTER — Ambulatory Visit (INDEPENDENT_AMBULATORY_CARE_PROVIDER_SITE_OTHER): Payer: 59 | Admitting: Physical Therapy

## 2022-06-30 DIAGNOSIS — R2681 Unsteadiness on feet: Secondary | ICD-10-CM

## 2022-06-30 DIAGNOSIS — R2689 Other abnormalities of gait and mobility: Secondary | ICD-10-CM | POA: Diagnosis not present

## 2022-06-30 DIAGNOSIS — M6281 Muscle weakness (generalized): Secondary | ICD-10-CM | POA: Diagnosis not present

## 2022-06-30 NOTE — Therapy (Signed)
OUTPATIENT PHYSICAL THERAPY TREATMENT NOTE & PROGRESS NOTE   Patient Name: Raymond [name] MRN: [id number] DOB:1961/01/22, 62 y.o., male Today's Date: 06/30/2022  PCP: [name], FNP  REFERRING PROVIDER: Newt Minion, MD    Progress Note Reporting Period  05/15/2022  to 06/30/2022  See note below for Objective Data and Assessment of Progress/Goals.      END OF SESSION:   PT End of Session - 06/30/22 0844     Visit Number 20    Number of Visits 25    Date for PT Re-Evaluation 08/01/22    Authorization Type [employer]ET[employer]    Authorization Time Period NO COPAY, 35 PT/OT VISITS Calendar Year Benefit Date 05/26/2021 - 05/25/2022    Authorization - Number of Visits 35    Progress Note Due on Visit 64    PT Start Time 0844    PT Stop Time 0925    PT Time Calculation (min) 41 min    Equipment Utilized During Treatment Gait belt    Activity Tolerance Patient tolerated treatment well    Behavior During Therapy WFL for tasks assessed/performed                           Past Medical History:  Diagnosis Date   Diabetes ([place])    Diabetes mellitus without complication ([place])    type 2   ESRD (end stage renal disease) ([place])    03/09/2015- patient had a kidney transplant   Hypertension    Peripheral vascular disease ([place])    Renal disorder    Renal insufficiency    Past Surgical History:  Procedure Laterality Date   ABDOMINAL AORTOGRAM W/LOWER EXTREMITY N/A 09/11/2021   Procedure: ABDOMINAL AORTOGRAM W/LOWER EXTREMITY;  Surgeon: Wellington Hampshire, MD;  Location: [facility];  Service: Cardiovascular;  Laterality: N/A;   AMPUTATION Right 09/25/2021   Procedure: RIGHT TRANSMETATARSAL AMPUTATION AND APPLY TISSUE GRAFT;  Surgeon: Newt Minion, MD;  Location: [facility];  Service: Orthopedics;  Laterality: Right;   AMPUTATION Right 11/16/2021   Procedure: RIGHT AMPUTATION BELOW KNEE WITH WOUND VAC APPLICATION;  Surgeon: Newt Minion, MD;  Location: [facility];   Service: Orthopedics;  Laterality: Right;   BONE BIOPSY Right 03/06/2021   Procedure: BONE BIOPSY;  Surgeon: Trula Slade, DPM;  Location: [facility];  Service: Podiatry;  Laterality: Right;   CENTRAL VENOUS CATHETER INSERTION Right 01/20/2020   Procedure: INSERTION CENTRAL LINE ADULT; Tunneled central line;  Surgeon: Virl Cagey, MD;  Location: [facility];  Service: General;  Laterality: Right;   COLONOSCOPY N/A 12/05/2014   OZD:[id number] external and internal hemorrhoid/mild diverticulosis/11 polyps removed   ESOPHAGOGASTRODUODENOSCOPY N/A 12/05/2014   QQV:ZDGL duodenitis   GRAFT APPLICATION Right 87/56/4332   Procedure: GRAFT APPLICATION;  Surgeon: Trula Slade, DPM;  [facility]: [facility];  Service: Podiatry;  Laterality: Right;   INCISION AND DRAINAGE OF WOUND Right 08/15/2021   Procedure: IRRIGATION AND DEBRIDEMENT WOUND;  Surgeon: Landis Martins, DPM;  Location: [facility];  Service: Podiatry;  Laterality: Right;  need to make card not  a irrigation and debridement card   IR FLUORO GUIDE CV LINE RIGHT  03/01/2019   IR PERC TUN PERIT CATH WO PORT S&I /IMAG  06/27/2021   IR REMOVAL TUN CV CATH W/O FL  05/10/2019   IR REMOVAL TUN CV CATH W/O FL  02/29/2020   IR REMOVAL TUN CV CATH W/O FL  07/19/2021   IR US GUIDE  VASC ACCESS RIGHT  03/01/2019   IR US GUIDE VASC ACCESS RIGHT  06/27/2021   IRRIGATION AND DEBRIDEMENT ELBOW Left 06/24/2021   Procedure: IRRIGATION AND DEBRIDEMENT ELBOW;  Surgeon: Lovell Sheehan, MD;  Location: [facility];  Service: Orthopedics;  Laterality: Left;   KIDNEY TRANSPLANT  03/27/2015   Penile Pump Insertion     PERIPHERAL VASCULAR ATHERECTOMY  09/11/2021   Procedure: PERIPHERAL VASCULAR ATHERECTOMY;  Surgeon: Wellington Hampshire, MD;  Location: [facility];  Service: Cardiovascular;;  Shockwave Lithotripsy   TEE WITHOUT CARDIOVERSION N/A 01/17/2020   Procedure: TRANSESOPHAGEAL ECHOCARDIOGRAM (TEE) WITH PROPOFOL;  Surgeon: Arnoldo Lenis, MD;  [facility];  Service: Endoscopy;  Laterality: N/A;   WOUND DEBRIDEMENT Right 03/06/2021   Procedure: DEBRIDEMENT WOUND;  Surgeon: Trula Slade, DPM;  [facility];  Service: Podiatry;  Laterality: Right;   Patient Active Problem List   Diagnosis Date Noted   Acquired absence of right leg below knee ([place]) 06/18/2022   Chronic kidney disease, stage 5 ([place]) 06/18/2022   Cutaneous abscess of right foot    Hyperkalemia 08/28/2021   Obesity (BMI 30-39.9) 08/28/2021   History of anemia due to chronic kidney disease 08/28/2021   Diabetic foot ulcer ([place]) 08/14/2021   Hypomagnesemia 06/25/2021   Peripheral vascular disease ([place]) 04/24/2020   Thrombophlebitis of superficial veins of right lower extremity    Difficult intravenous access    MRSA bacteremia    Diabetic foot infection ([place])    Infected wound 01/12/2020   Critical lower limb ischemia ([place]) 02/02/2019   Personal history of noncompliance with medical treatment, presenting hazards to health 10/07/2018   Critical limb ischemia with history of revascularization of same extremity ([place]) 07/27/2018   Mixed hyperlipidemia 02/19/2018   History of renal transplant 02/19/2018   Conductive hearing loss, bilateral 08/31/2017   Nuclear sclerotic cataract of right eye 10/07/2016   Pseudophakia of left eye 10/07/2016   Hypertension due to endocrine disorder 03/04/2016   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 ([place]) 03/04/2016   Renal failure 03/04/2016   Proliferative diabetic retinopathy with macular edema associated with type 2 diabetes mellitus ([place]) 03/04/2016   Immunosuppression ([place]) 12/10/2015   Nausea vomiting and diarrhea 12/10/2015   Type 2 diabetes mellitus ([place]) 12/10/2015   Essential hypertension, benign 12/10/2015   Pancytopenia ([place]) 12/10/2015   Encounter for screening colonoscopy 11/15/2014   GERD (gastroesophageal reflux disease) 11/15/2014     REFERRING DIAG: Q94.765Y (ICD-10-CM) - Below-knee amputation of right lower extremit   ONSET DATE: 04/10/2022 prosthesis delivery   THERAPY DIAG:  Unsteadiness on feet  Other abnormalities of gait and mobility  Muscle weakness (generalized)  Rationale for Evaluation and Treatment Rehabilitation  PERTINENT HISTORY: DM2, PVD, ESRD s/p renal transplant 03/09/2015 on immunosuppressive meds, HTN   PRECAUTIONS: None  SUBJECTIVE:  SUBJECTIVE STATEMENT: He went to [employer] over weekend which requires packed dirt & longer distances.  He has been doing floor transfers & lunges.   PAIN:  Are you having pain? Residual limb pain  0/10 soreness from wearing prosthesis without ability to seat limb into socket.   OBJECTIVE: (objective measures completed at initial evaluation unless otherwise dated) POSTURE: rounded shoulders, forward head, flexed trunk , and weight shift left   LOWER EXTREMITY ROM:   ROM P:passive  A:active Right eval Left eval  Hip flexion      Hip extension      Hip abduction      Hip adduction      Hip internal rotation      Hip external rotation      Knee flexion 100*    Knee extension -5*    Ankle dorsiflexion      Ankle plantarflexion      Ankle inversion      Ankle eversion       (Blank rows = not tested)   LOWER EXTREMITY MMT:   MMT Right eval Left eval  Hip flexion 3+/5    Hip extension 3-/5    Hip abduction 3-/5    Hip adduction      Hip internal rotation      Hip external rotation      Knee flexion 3-/5    Knee extension 3/5    Ankle dorsiflexion      Ankle plantarflexion      Ankle inversion      Ankle eversion      (Blank rows = not tested)   TRANSFERS: Sit to stand: 18" chair with armrests to RW with supervision.  Stand to sit: RW to 18" chair with  armrests with supervision   PROSTHETIC GAIT WITH BKA PROSTHESIS: 06/30/2022: pt amb 400' with prosthesis only with lateral trunk lean towards prosthesis in stance.  No loss of balance noted.  Gait Velocity: 2.01 ft/sec   04/16/2023: Gait pattern: step to pattern, decreased arm swing- Right, decreased step length- Right, decreased stance time- Left, decreased stride length, decreased hip/knee flexion- Right, circumduction- Right, Right hip hike, knee flexed in stance- Right, antalgic, lateral hip instability, trunk flexed, and abducted- Right Distance walked: 36' with RW & 20' with cane Assistive device utilized: Single point cane, Walker - 2 wheeled, and BKA prosthesis Level of assistance: SBA with RW and Min A Gait velocity: 0.89 ft/sec Comments: excessive weight bearing on RW, partial weight on prosthesis   FUNCTIONAL TESTs:  06/24/2022: Merrilee Jansky Balance 46/56  05/12/2022:  Merrilee Jansky Balance test 42/56 TUG with prosthesis only: std 9.96sec & cog 10.94sec  04/15/2022: Timed up and go (TUG): 39.25sec with cane & BKA prosthesis Berg Balance Scale: 24/56     CURRENT PROSTHETIC WEAR ASSESSMENT: Patient is dependent with: skin check, residual limb care, care of non-amputated limb, prosthetic cleaning, ply sock cleaning, correct ply sock adjustment, proper wear schedule/adjustment, and proper weight-bearing schedule/adjustment Donning prosthesis: Min A Doffing prosthesis: SBA Prosthetic wear tolerance: 3 hours, 1-2x/day, 5 of 5 days since delivery Prosthetic weight bearing tolerance: 5 minutes Edema: pitting. Residual limb condition: 46m scab wound on medial incision, dry skin, normal color & temperature, cylindrical shape Prosthetic description: silicon liner with pin lock suspension, total contact socket, hydraulic ankle / K3 foot K code/activity level with prosthetic use: Level 3       TODAY'S TREATMENT:  DATE:  06/30/2022: Prosthetic Training with TTA prosthesis: PT educated pt with verbal discussion on fall risk when prosthesis is off and need for w/c or RW close at night when off. Using prosthesis to enter/exit bathroom for showering.  PT also discussed traveling staying with family/friends & in hotel and flying.  Pt verbalized understanding.  He continues to report wear all awake hours. Weighing each morning for fluid retention.  He reports he has noticed significant change in his current mobility with this.   Therapeutic Activities Push/pull walking forward/back 30# cable wt (15# per stack) 5 reps wt anterior & 5 reps wt posterior. 20# side stepping 10 reps.  Push & pull in straddle step with wt shift 40# (20# per UE) wt posterior with forward reach 10 reps & anterior with row 10 reps.  Lifting box & carrying 100' with BUEs. 20# 2 reps, 30# & 40# 1 rep ea.  Verbal cues  on prosthetic foot position for lift & lowering to floor. PT educated in resistive gait with black theraband on pelvis walking away from door & back to door controlled against resistance.  3 reps ea anterior pelvis to door, right side to door, posterior pelvis to door & left side to door.  Pt verbalized & return demo understanding as HEP.  Black theraband for push & pull with weight shift between LEs (prosthesis forward in step position). 10 reps ea.  Pt verbalized & return demo understanding as HEP.    06/26/2022: Prosthetic Training with TTA prosthesis: Pt able to demo floor transfer pushing with UEs on chair seat.  Lunges between 2 chairs for UE support: leading ea LE 5 reps 2 sets.  Minor cues to increase LE involvement.  Pt climbed A-frame ladder to 3rd rung & simulated task standing on ladder with minimal cues. Progressed to carry 5# wt in single UE. Pt more stable & prefers wt in LUE. Push/pull walking forward/back 30# cable wt (15# per stack) 5 reps wt anterior & 5 reps wt  posterior. 20# side stepping 10 reps.  Push & pull in straddle step with wt shift 40# (20# per UE) wt posterior with forward reach 10 reps & anterior with row 10 reps.  Lifting 15# kettle bell & carry 100' (50' in [place] 24' in LUE) 2 reps.  Lifting 18# & 38# box & carrying 100' with BUEs.   PT recommended setting timer for 1 hour and standing 15-20 min ea hour for 12 hrs /day to build stamina.  Pt verbalized understanding.    06/24/2022: Prosthetic Training with TTA prosthesis: PT demo & verbal cues on floor transfer via half kneeling onto LLE. Pt return demo understanding 3 reps. PT recommended performing 5 reps per day. Pt verbalized understanding. Lunges between 2 chairs for UE support: leading ea LE 5 reps 2 sets.  Pt return demo & verbalized understanding as HEP. PT demo & verbal cues on climbing A-frame & reaching 2 hands for simulated task. Pt climbed 3 rungs 2 reps with supervision.  Pt return demo & verbalized understanding as HEP. Push/pull walking forward/back 30# cable wt (15# per stack) 5 reps wt anterior & 5 reps wt posterior. 20# side stepping 5 reps. Needed cues for controlled to left with wts pulling him.  Push & pull in straddle step with wt shift 30# (15# per UE) wt posterior with forward reach 5 reps & anterior with row 5 reps.    HOME EXERCISE PROGRAM: Access Code: [facility] URL: [email or web address] Date: 05/22/2022 Prepared by: Shirlean Mylar  Kendel Bessey  Exercises - Hip Best boy at [employer] of Bed  - 1-3 x daily - 7 x weekly - 1 sets - 3 reps - 20-30 seconds hold - Supine Quadriceps Stretch with Strap on Table  - 1-3 x daily - 7 x weekly - 1 sets - 3 reps - 20-30 seconds hold - Supine Bridge  - 1 x daily - 7 x weekly - 2-3 sets - 10 reps - 5 seconds hold - Marching Bridge  - 1 x daily - 7 x weekly - 2-3 sets - 10 reps - 5 seconds hold - Standing Terminal Knee Extension with Resistance  - 1 x daily - 7 x weekly - 2-3 sets - 10 reps - 5 seconds hold - Standing Hip  Flexion with Resistance (Mirrored)  - 1 x daily - 7 x weekly - 1 sets - 10 reps - Standing Hip Adduction with Resistance (Mirrored)  - 1 x daily - 7 x weekly - 1 sets - 10 reps - Standing Hip Extension with Resistance  - 1 x daily - 7 x weekly - 1 sets - 10 reps - Standing Hip Abduction with Theraband Resistance  - 1 x daily - 7 x weekly - 1 sets - 10 reps - Standing Tandem Balance with Counter Support  - 1 x daily - 7 x weekly - 1 sets - 3-4 reps - 30 seconds hold - Tandem Walking with Counter Support  - 1 x daily - 7 x weekly - 1 sets - 6 reps - single leg stance with one foot in lower cabinet  - 1 x daily - 7 x weekly - 1 sets - 5-10 reps - 10 seconds hold - Carioca with Counter Support  - 1 x daily - 7 x weekly - 1 sets - 10 reps - Alternating Punch with Resistance  - 1 x daily - 5 x weekly - 1 sets - 10 reps - 5 seconds hold - Standing Scapular Protraction with Resistance  - 1 x daily - 7 x weekly - 1 sets - 10 reps - 5 seconds hold - Standing alternate rows with resistance  - 1 x daily - 7 x weekly - 1 sets - 10 reps - 5 seconds hold - Standing Row with Anchored Resistance  - 1 x daily - 7 x weekly - 1 sets - 10 reps - 5 seconds hold - Alternating elbow flexion with resistance  - 1 x daily - 7 x weekly - 1 sets - 10 reps - 5 seconds hold - Standing Bicep Curls with Resistance  - 1 x daily - 7 x weekly - 1 sets - 10 reps - 5 seconds hold   Do each exercise 1-2  times per day Do each exercise 5-10 repetitions Hold each exercise for 2 seconds to feel your location  AT [place].  Try to find this position when standing still for activities.   USE TAPE ON FLOOR TO MARK THE MIDLINE POSITION which is even with middle of sink.  You also should try to feel with your limb pressure in socket.  You are trying to feel with limb what you used to feel with the bottom of your foot.  Side to Side Shift: Moving your hips only (not  shoulders): move weight onto your left leg, HOLD/FEEL pressure in socket.  Move back to equal weight on each leg, HOLD/FEEL pressure in socket. Move weight onto your  right leg, HOLD/FEEL pressure in socket. Move back to equal weight on each leg, HOLD/FEEL pressure in socket. Repeat.  Start with both hands on sink, progress to hand on prosthetic side only, then no hands.  Front to Back Shift: Moving your hips only (not shoulders): move your weight forward onto your toes, HOLD/FEEL pressure in socket. Move your weight back to equal Flat Foot on both legs, HOLD/FEEL  pressure in socket. Move your weight back onto your heels, HOLD/FEEL  pressure in socket. Move your weight back to equal on both legs, HOLD/FEEL  pressure in socket. Repeat.  Start with both hands on sink, progress to hand on prosthetic side only, then no hands.  Moving Cones / Cups: With equal weight on each leg: Hold on with one hand the first time, then progress to no hand supports. Move cups from one side of sink to the other. Place cups ~2" out of your reach, progress to 10" beyond reach.  Place one hand in middle of sink and reach with other hand. Do both arms.  Then hover one hand and move cups with other hand.  Overhead/Upward Reaching: alternated reaching up to top cabinets or ceiling if no cabinets present. Keep equal weight on each leg. Start with one hand support on counter while other hand reaches and progress to no hand support with reaching.  ace one hand in middle of sink and reach with other hand. Do both arms.  Then hover one hand and move cups with other hand.  5.   Looking Over Shoulders: With equal weight on each leg: alternate turning to look over your shoulders with one hand support on counter as needed.  Start with head motions only to look in front of shoulder, then even with shoulder and progress to looking behind you. To look to side, move head /eyes, then shoulder on side looking pulls back, shift more weight to side looking  and pull hip back. Place one hand in middle of sink and let go with other hand so your shoulder can pull back. Switch hands to look other way.   Then hover one hand and look over shoulder. If looking right, use left hand at sink. If looking left, use right hand at sink. 6.  Stepping with leg that is not amputated:  Move items under cabinet out of your way. Shift your hips/pelvis so weight on prosthesis. Tighten muscles in hip on prosthetic side.  SLOWLY step other leg so front of foot is in cabinet. Then step back to floor.      ASSESSMENT:  CLINICAL IMPRESSION: Pt has seen PT for 10 visits since last progress note.  He appears to be improving with function & balance.  Pt verbalizes improved function with his prosthesis partially related to seeing other individuals with prosthesis in community making him realize how much he has progressed.  PT instructed in HEP for resistive exercises which he appears to understand.   Pt continues to benefit from skilled PT.  OBJECTIVE IMPAIRMENTS: Abnormal gait, decreased activity tolerance, decreased balance, decreased endurance, decreased knowledge of condition, decreased knowledge of use of DME, decreased mobility, decreased ROM, decreased strength, increased edema, impaired flexibility, postural dysfunction, prosthetic dependency , obesity, and wound on residual limb .    ACTIVITY LIMITATIONS: carrying, lifting, bending, standing, squatting, stairs, transfers, locomotion level, and prosthetic use   PARTICIPATION LIMITATIONS: meal prep, cleaning, laundry, driving, community activity, occupation, and yard work   PERSONAL FACTORS: Fitness, Time since onset of injury/illness/exacerbation, and 3+  comorbidities: see PMH  are also affecting patient's functional outcome.    REHAB POTENTIAL: Good   CLINICAL DECISION MAKING: Evolving/moderate complexity   EVALUATION COMPLEXITY: Moderate     GOALS: Goals reviewed with patient? Yes   SHORT TERM GOALS: Target date:  06/20/2022   Patient verbalizes how to adjust ply socks. Baseline: SEE OBJECTIVE DATA Goal status: MET 06/24/2022 2.  Patient tolerates prosthesis >90% awake hrs /day without skin issues or limb pain after standing. Baseline: SEE OBJECTIVE DATA Goal status:  MET 06/17/2022   3. Berg Balance >45/56 Baseline: SEE OBJECTIVE DATA Goal status:  Ongoing 05/29/2022   4. Patient ambulates 400' with prosthesis with supervision. Baseline: SEE OBJECTIVE DATA Goal status:  MET 06/24/2022   5. Patient negotiates ramps & curbs with prosthesis with supervision Baseline: SEE OBJECTIVE DATA Goal status:  MET 06/24/2022   LONG TERM GOALS: Target date: 07/31/2021   Patient demonstrates & verbalized understanding of prosthetic care to enable safe utilization of prosthesis. Baseline: SEE OBJECTIVE DATA Goal status: Ongoing 05/29/2022   Patient tolerates prosthesis wear >90% of awake hours without skin or limb pain issues. Baseline: SEE OBJECTIVE DATA Goal status: Ongoing 05/29/2022   Functional Gait Assessment >22/30 to indicate lower fall risk Baseline: SEE OBJECTIVE DATA Goal status: Ongoing 05/29/2022   Patient ambulates >500' with prosthesis only independently Baseline: SEE OBJECTIVE DATA Goal status: Ongoing 05/29/2022   Patient negotiates ramps, curbs & stairs with single rail with prosthesis only independently. Baseline: SEE OBJECTIVE DATA Goal status: Ongoing 05/29/2022   Patient demonstrates & verbalizes lifting, carrying, pushing, pulling with prosthesis only safely. Baseline: SEE OBJECTIVE DATA Goal status:  Ongoing 05/29/2022     PLAN:   PT FREQUENCY: 3x/week for 5 weeks, then 1-2 x/wk for 10 weeks   PT DURATION: other: 15 weeks   PLANNED INTERVENTIONS: Therapeutic exercises, Therapeutic activity, Neuromuscular re-education, Balance training, Gait training, Patient/Family education, Self Care, Stair training, Vestibular training, Prosthetic training, DME instructions, and physical performance  testing   PLAN FOR NEXT SESSION:  check on HEP resistive gait with black theraband.  continue work simulation,   continue push/pull with pulley weights  Jamey Reas, PT, DPT 06/30/2022, 11:19 AM

## 2022-07-01 ENCOUNTER — Encounter: Payer: Self-pay | Admitting: Physical Therapy

## 2022-07-01 ENCOUNTER — Ambulatory Visit (INDEPENDENT_AMBULATORY_CARE_PROVIDER_SITE_OTHER): Payer: 59 | Admitting: Physical Therapy

## 2022-07-01 DIAGNOSIS — M6281 Muscle weakness (generalized): Secondary | ICD-10-CM

## 2022-07-01 DIAGNOSIS — R2681 Unsteadiness on feet: Secondary | ICD-10-CM | POA: Diagnosis not present

## 2022-07-01 DIAGNOSIS — R2689 Other abnormalities of gait and mobility: Secondary | ICD-10-CM | POA: Diagnosis not present

## 2022-07-01 NOTE — Therapy (Signed)
OUTPATIENT PHYSICAL THERAPY TREATMENT NOTE   Patient Name: Raymond [name] MRN: [id number] DOB:01-Oct-1960, 62 y.o., male Today's Date: 07/01/2022  PCP: [name], FNP  REFERRING PROVIDER: Newt Minion, MD      END OF SESSION:   PT End of Session - 07/01/22 0801     Visit Number 21    Number of Visits 25    Date for PT Re-Evaluation 08/01/22    Authorization Type [employer]ET[employer]    Authorization Time Period NO COPAY, 35 PT/OT VISITS Calendar Year Benefit Date 05/26/2021 - 05/25/2022    Authorization - Number of Visits 35    Progress Note Due on Visit 30    PT Start Time 0800    PT Stop Time 0844    PT Time Calculation (min) 44 min    Equipment Utilized During Treatment Gait belt    Activity Tolerance Patient tolerated treatment well    Behavior During Therapy WFL for tasks assessed/performed                            Past Medical History:  Diagnosis Date   Diabetes ([place])    Diabetes mellitus without complication ([place])    type 2   ESRD (end stage renal disease) ([place])    03/09/2015- patient had a kidney transplant   Hypertension    Peripheral vascular disease ([place])    Renal disorder    Renal insufficiency    Past Surgical History:  Procedure Laterality Date   ABDOMINAL AORTOGRAM W/LOWER EXTREMITY N/A 09/11/2021   Procedure: ABDOMINAL AORTOGRAM W/LOWER EXTREMITY;  Surgeon: Wellington Hampshire, MD;  Location: [facility];  Service: Cardiovascular;  Laterality: N/A;   AMPUTATION Right 09/25/2021   Procedure: RIGHT TRANSMETATARSAL AMPUTATION AND APPLY TISSUE GRAFT;  Surgeon: Newt Minion, MD;  Location: [facility];  Service: Orthopedics;  Laterality: Right;   AMPUTATION Right 11/16/2021   Procedure: RIGHT AMPUTATION BELOW KNEE WITH WOUND VAC APPLICATION;  Surgeon: Newt Minion, MD;  Location: [facility];  Service: Orthopedics;  Laterality: Right;   BONE BIOPSY Right 03/06/2021   Procedure: BONE BIOPSY;  Surgeon: Trula Slade, DPM;  Location: [facility];   Service: Podiatry;  Laterality: Right;   CENTRAL VENOUS CATHETER INSERTION Right 01/20/2020   Procedure: INSERTION CENTRAL LINE ADULT; Tunneled central line;  Surgeon: Virl Cagey, MD;  Location: [facility];  Service: General;  Laterality: Right;   COLONOSCOPY N/A 12/05/2014   JIR:CVELFYBO external and internal hemorrhoid/mild diverticulosis/11 polyps removed   ESOPHAGOGASTRODUODENOSCOPY N/A 12/05/2014   FBP:ZWCH duodenitis   GRAFT APPLICATION Right 85/27/7824   Procedure: GRAFT APPLICATION;  Surgeon: Trula Slade, DPM;  Location: [facility];  Service: Podiatry;  Laterality: Right;   INCISION AND DRAINAGE OF WOUND Right 08/15/2021   Procedure: IRRIGATION AND DEBRIDEMENT WOUND;  Surgeon: Landis Martins, DPM;  Location: [facility];  Service: Podiatry;  Laterality: Right;  need to make card not  a irrigation and debridement card   IR FLUORO GUIDE CV LINE RIGHT  03/01/2019   IR PERC TUN PERIT CATH WO PORT S&I /IMAG  06/27/2021   IR REMOVAL TUN CV CATH W/O FL  05/10/2019   IR REMOVAL TUN CV CATH W/O FL  02/29/2020   IR REMOVAL TUN CV CATH W/O FL  07/19/2021   IR US GUIDE VASC ACCESS RIGHT  03/01/2019   IR US GUIDE VASC ACCESS RIGHT  06/27/2021   IRRIGATION AND DEBRIDEMENT ELBOW Left 06/24/2021  Procedure: IRRIGATION AND DEBRIDEMENT ELBOW;  Surgeon: Lovell Sheehan, MD;  Location: [facility];  Service: Orthopedics;  Laterality: Left;   KIDNEY TRANSPLANT  03/27/2015   Penile Pump Insertion     PERIPHERAL VASCULAR ATHERECTOMY  09/11/2021   Procedure: PERIPHERAL VASCULAR ATHERECTOMY;  Surgeon: Wellington Hampshire, MD;  Location: [facility];  Service: Cardiovascular;;  Sh[facility]wave Lithotripsy   TEE WITHOUT CARDIOVERSION N/A 01/17/2020   Procedure: TRANSESOPHAGEAL ECHOCARDIOGRAM (TEE) WITH PROPOFOL;  Surgeon: Arnoldo Lenis, MD;  [facility];  Service: Endoscopy;  Laterality: N/A;   WOUND DEBRIDEMENT Right 03/06/2021   Procedure: DEBRIDEMENT WOUND;  Surgeon: Trula Slade, DPM;   Location: [facility];  Service: Podiatry;  Laterality: Right;   Patient Active Problem List   Diagnosis Date Noted   Acquired absence of right leg below knee ([place]) 06/18/2022   Chronic kidney disease, stage 5 ([place]) 06/18/2022   Cutaneous abscess of right foot    Hyperkalemia 08/28/2021   Obesity (BMI 30-39.9) 08/28/2021   History of anemia due to chronic kidney disease 08/28/2021   Diabetic foot ulcer ([place]) 08/14/2021   Hypomagnesemia 06/25/2021   Peripheral vascular disease ([place]) 04/24/2020   Thrombophlebitis of superficial veins of right lower extremity    Difficult intravenous access    MRSA bacteremia    Diabetic foot infection ([place])    Infected wound 01/12/2020   Critical lower limb ischemia ([place]) 02/02/2019   Personal history of noncompliance with medical treatment, presenting hazards to health 10/07/2018   Critical limb ischemia with history of revascularization of same extremity ([place]) 07/27/2018   Mixed hyperlipidemia 02/19/2018   History of renal transplant 02/19/2018   Conductive hearing loss, bilateral 08/31/2017   Nuclear sclerotic cataract of right eye 10/07/2016   Pseudophakia of left eye 10/07/2016   Hypertension due to endocrine disorder 03/04/2016   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 ([place]) 03/04/2016   Renal failure 03/04/2016   Proliferative diabetic retinopathy with macular edema associated with type 2 diabetes mellitus ([place]) 03/04/2016   Immunosuppression ([place]) 12/10/2015   Nausea vomiting and diarrhea 12/10/2015   Type 2 diabetes mellitus ([place]) 12/10/2015   Essential hypertension, benign 12/10/2015   Pancytopenia ([place]) 12/10/2015   Encounter for screening colonoscopy 11/15/2014   GERD (gastroesophageal reflux disease) 11/15/2014    REFERRING DIAG: W97.989Q (ICD-10-CM) - Below-knee amputation of right lower extremit   ONSET DATE: 04/10/2022 prosthesis delivery   THERAPY DIAG:   Unsteadiness on feet  Other abnormalities of gait and mobility  Muscle weakness (generalized)  Rationale for Evaluation and Treatment Rehabilitation  PERTINENT HISTORY: DM2, PVD, ESRD s/p renal transplant 03/09/2015 on immunosuppressive meds, HTN   PRECAUTIONS: None  SUBJECTIVE:  SUBJECTIVE STATEMENT:    He did band in door as PT advised yesterday with no issues.  He has difficulty getting into his high truck.   PAIN:  Are you having pain? Residual limb pain  0/10 soreness from wearing prosthesis without ability to seat limb into socket.   OBJECTIVE: (objective measures completed at initial evaluation unless otherwise dated) POSTURE: rounded shoulders, forward head, flexed trunk , and weight shift left   LOWER EXTREMITY ROM:   ROM P:passive  A:active Right eval Left eval  Hip flexion      Hip extension      Hip abduction      Hip adduction      Hip internal rotation      Hip external rotation      Knee flexion 100*    Knee extension -5*    Ankle dorsiflexion      Ankle plantarflexion      Ankle inversion      Ankle eversion       (Blank rows = not tested)   LOWER EXTREMITY MMT:   MMT Right eval Left eval  Hip flexion 3+/5    Hip extension 3-/5    Hip abduction 3-/5    Hip adduction      Hip internal rotation      Hip external rotation      Knee flexion 3-/5    Knee extension 3/5    Ankle dorsiflexion      Ankle plantarflexion      Ankle inversion      Ankle eversion      (Blank rows = not tested)   TRANSFERS: Sit to stand: 18" chair with armrests to RW with supervision.  Stand to sit: RW to 18" chair with armrests with supervision   PROSTHETIC GAIT WITH BKA PROSTHESIS: 06/30/2022: pt amb 400' with prosthesis only with lateral trunk lean towards prosthesis in stance.  No loss  of balance noted.  Gait Velocity: 2.01 ft/sec   04/16/2023: Gait pattern: step to pattern, decreased arm swing- Right, decreased step length- Right, decreased stance time- Left, decreased stride length, decreased hip/knee flexion- Right, circumduction- Right, Right hip hike, knee flexed in stance- Right, antalgic, lateral hip instability, trunk flexed, and abducted- Right Distance walked: 30' with RW & 20' with cane Assistive device utilized: Single point cane, Walker - 2 wheeled, and BKA prosthesis Level of assistance: SBA with RW and Min A Gait velocity: 0.89 ft/sec Comments: excessive weight bearing on RW, partial weight on prosthesis   FUNCTIONAL TESTs:  06/24/2022: Merrilee Jansky Balance 46/56  05/12/2022:  Merrilee Jansky Balance test 42/56 TUG with prosthesis only: std 9.96sec & cog 10.94sec  04/15/2022: Timed up and go (TUG): 39.25sec with cane & BKA prosthesis Berg Balance Scale: 24/56     CURRENT PROSTHETIC WEAR ASSESSMENT: Patient is dependent with: skin check, residual limb care, care of non-amputated limb, prosthetic cleaning, ply sock cleaning, correct ply sock adjustment, proper wear schedule/adjustment, and proper weight-bearing schedule/adjustment Donning prosthesis: Min A Doffing prosthesis: SBA Prosthetic wear tolerance: 3 hours, 1-2x/day, 5 of 5 days since delivery Prosthetic weight bearing tolerance: 5 minutes Edema: pitting. Residual limb condition: 60m scab wound on medial incision, dry skin, normal color & temperature, cylindrical shape Prosthetic description: silicon liner with pin lock suspension, total contact socket, hydraulic ankle / K3 foot K code/activity level with prosthetic use: Level 3       TODAY'S TREATMENT:  DATE:  07/01/2022: Therapeutic Activities: PT adjusted mat table similar to height of truck seat.  PT demo & verbal cues for getting  in & out. Pt return demo understanding. Lifting box & carrying 120' with BUEs. 20#, 30# & 40# 1 rep ea.  Verbal cues  on prosthetic foot position for lift & lowering to floor. PT demo & verbal cues on moving 40# box 90* & push onto cart / truck bed then pulling off cart, turning 90* & lowering to floor.  Pt return demo 5 reps with correctional cues. Pt verbalized better understanding at end of activity. Push/pull walking forward/back 40# cable wt (20# per stack) 5 reps wt anterior & 5 reps wt posterior. 20# side stepping 10 reps.  Push & pull in straddle step with wt shift 40# (20# per UE) wt posterior with forward reach 10 reps & anterior with row 10 reps.  Pulley wt 5# hip add, flex SLR, flex bending knee, abd, ext 10 reps ea LE with single UE support. Cues on upright trunk / not leaning.    06/30/2022: Prosthetic Training with TTA prosthesis: PT educated pt with verbal discussion on fall risk when prosthesis is off and need for w/c or RW close at night when off. Using prosthesis to enter/exit bathroom for showering.  PT also discussed traveling staying with family/friends & in hotel and flying.  Pt verbalized understanding.  He continues to report wear all awake hours. Weighing each morning for fluid retention.  He reports he has noticed significant change in his current mobility with this.   Therapeutic Activities Push/pull walking forward/back 30# cable wt (15# per stack) 5 reps wt anterior & 5 reps wt posterior. 20# side stepping 10 reps.  Push & pull in straddle step with wt shift 40# (20# per UE) wt posterior with forward reach 10 reps & anterior with row 10 reps.  Lifting box & carrying 100' with BUEs. 20# 2 reps, 30# & 40# 1 rep ea.  Verbal cues  on prosthetic foot position for lift & lowering to floor. PT educated in resistive gait with black theraband on pelvis walking away from door & back to door controlled against resistance.  3 reps ea anterior pelvis to door, right side to door,  posterior pelvis to door & left side to door.  Pt verbalized & return demo understanding as HEP.  Black theraband for push & pull with weight shift between LEs (prosthesis forward in step position). 10 reps ea.  Pt verbalized & return demo understanding as HEP.    06/26/2022: Prosthetic Training with TTA prosthesis: Pt able to demo floor transfer pushing with UEs on chair seat.  Lunges between 2 chairs for UE support: leading ea LE 5 reps 2 sets.  Minor cues to increase LE involvement.  Pt climbed A-frame ladder to 3rd rung & simulated task standing on ladder with minimal cues. Progressed to carry 5# wt in single UE. Pt more stable & prefers wt in LUE. Push/pull walking forward/back 30# cable wt (15# per stack) 5 reps wt anterior & 5 reps wt posterior. 20# side stepping 10 reps.  Push & pull in straddle step with wt shift 40# (20# per UE) wt posterior with forward reach 10 reps & anterior with row 10 reps.  Lifting 15# kettle bell & carry 100' (50' in [place] 43' in LUE) 2 reps.  Lifting 18# & 38# box & carrying 100' with BUEs.   PT recommended setting timer for 1 hour and standing 15-20 min  ea hour for 12 hrs /day to build stamina.  Pt verbalized understanding.    HOME EXERCISE PROGRAM: Access Code: [facility] URL: [email or web address] Date: 05/22/2022 Prepared by: Jamey Reas  Exercises - Hip Flexor Stretch at [facility] of Bed  - 1-3 x daily - 7 x weekly - 1 sets - 3 reps - 20-30 seconds hold - Supine Quadriceps Stretch with Strap on Table  - 1-3 x daily - 7 x weekly - 1 sets - 3 reps - 20-30 seconds hold - Supine Bridge  - 1 x daily - 7 x weekly - 2-3 sets - 10 reps - 5 seconds hold - Marching Bridge  - 1 x daily - 7 x weekly - 2-3 sets - 10 reps - 5 seconds hold - Standing Terminal Knee Extension with Resistance  - 1 x daily - 7 x weekly - 2-3 sets - 10 reps - 5 seconds hold - Standing Hip Flexion with Resistance (Mirrored)  - 1 x daily - 7 x weekly - 1 sets - 10 reps -  Standing Hip Adduction with Resistance (Mirrored)  - 1 x daily - 7 x weekly - 1 sets - 10 reps - Standing Hip Extension with Resistance  - 1 x daily - 7 x weekly - 1 sets - 10 reps - Standing Hip Abduction with Theraband Resistance  - 1 x daily - 7 x weekly - 1 sets - 10 reps - Standing Tandem Balance with Counter Support  - 1 x daily - 7 x weekly - 1 sets - 3-4 reps - 30 seconds hold - Tandem Walking with Counter Support  - 1 x daily - 7 x weekly - 1 sets - 6 reps - single leg stance with one foot in lower cabinet  - 1 x daily - 7 x weekly - 1 sets - 5-10 reps - 10 seconds hold - Carioca with Counter Support  - 1 x daily - 7 x weekly - 1 sets - 10 reps - Alternating Punch with Resistance  - 1 x daily - 5 x weekly - 1 sets - 10 reps - 5 seconds hold - Standing Scapular Protraction with Resistance  - 1 x daily - 7 x weekly - 1 sets - 10 reps - 5 seconds hold - Standing alternate rows with resistance  - 1 x daily - 7 x weekly - 1 sets - 10 reps - 5 seconds hold - Standing Row with Anchored Resistance  - 1 x daily - 7 x weekly - 1 sets - 10 reps - 5 seconds hold - Alternating elbow flexion with resistance  - 1 x daily - 7 x weekly - 1 sets - 10 reps - 5 seconds hold - Standing Bicep Curls with Resistance  - 1 x daily - 7 x weekly - 1 sets - 10 reps - 5 seconds hold   Do each exercise 1-2  times per day Do each exercise 5-10 repetitions Hold each exercise for 2 seconds to feel your location  AT [place].  Try to find this position when standing still for activities.   USE TAPE ON FLOOR TO MARK THE MIDLINE POSITION which is even with middle of sink.  You also should try to feel with your limb pressure in socket.  You are trying to feel with limb what you used to feel with the bottom of your foot.  Side to Side Shift: Moving your hips only (not  shoulders): move weight onto your left leg, HOLD/FEEL pressure in socket.  Move back  to equal weight on each leg, HOLD/FEEL pressure in socket. Move weight onto your right leg, HOLD/FEEL pressure in socket. Move back to equal weight on each leg, HOLD/FEEL pressure in socket. Repeat.  Start with both hands on sink, progress to hand on prosthetic side only, then no hands.  Front to Back Shift: Moving your hips only (not shoulders): move your weight forward onto your toes, HOLD/FEEL pressure in socket. Move your weight back to equal Flat Foot on both legs, HOLD/FEEL  pressure in socket. Move your weight back onto your heels, HOLD/FEEL  pressure in socket. Move your weight back to equal on both legs, HOLD/FEEL  pressure in socket. Repeat.  Start with both hands on sink, progress to hand on prosthetic side only, then no hands.  Moving Cones / Cups: With equal weight on each leg: Hold on with one hand the first time, then progress to no hand supports. Move cups from one side of sink to the other. Place cups ~2" out of your reach, progress to 10" beyond reach.  Place one hand in middle of sink and reach with other hand. Do both arms.  Then hover one hand and move cups with other hand.  Overhead/Upward Reaching: alternated reaching up to top cabinets or ceiling if no cabinets present. Keep equal weight on each leg. Start with one hand support on counter while other hand reaches and progress to no hand support with reaching.  ace one hand in middle of sink and reach with other hand. Do both arms.  Then hover one hand and move cups with other hand.  5.   Looking Over Shoulders: With equal weight on each leg: alternate turning to look over your shoulders with one hand support on counter as needed.  Start with head motions only to look in front of shoulder, then even with shoulder and progress to looking behind you. To look to side, move head /eyes, then shoulder on side looking pulls back, shift more weight to side looking and pull hip back. Place one hand in middle of sink and let go with other hand so  your shoulder can pull back. Switch hands to look other way.   Then hover one hand and look over shoulder. If looking right, use left hand at sink. If looking left, use right hand at sink. 6.  Stepping with leg that is not amputated:  Move items under cabinet out of your way. Shift your hips/pelvis so weight on prosthesis. Tighten muscles in hip on prosthetic side.  SLOWLY step other leg so front of foot is in cabinet. Then step back to floor.      ASSESSMENT:  CLINICAL IMPRESSION: PT progressed push, pull, lifting into simulated tasks that he combined motions that PT has been working on.  He improved with instruction & repetition. He also reports instruction for getting in & out of truck should help.    Pt continues to benefit from skilled PT.  OBJECTIVE IMPAIRMENTS: Abnormal gait, decreased activity tolerance, decreased balance, decreased endurance, decreased knowledge of condition, decreased knowledge of use of DME, decreased mobility, decreased ROM, decreased strength, increased edema, impaired flexibility, postural dysfunction, prosthetic dependency , obesity, and wound on residual limb .    ACTIVITY LIMITATIONS: carrying, lifting, bending, standing, squatting, stairs, transfers, locomotion level, and prosthetic use   PARTICIPATION LIMITATIONS: meal prep, cleaning, laundry, driving, community activity, occupation, and yard work   PERSONAL FACTORS:  Fitness, Time since onset of injury/illness/exacerbation, and 3+ comorbidities: see PMH  are also affecting patient's functional outcome.    REHAB POTENTIAL: Good   CLINICAL DECISION MAKING: Evolving/moderate complexity   EVALUATION COMPLEXITY: Moderate     GOALS: Goals reviewed with patient? Yes   SHORT TERM GOALS: Target date: 06/20/2022   Patient verbalizes how to adjust ply socks. Baseline: SEE OBJECTIVE DATA Goal status: MET 06/24/2022 2.  Patient tolerates prosthesis >90% awake hrs /day without skin issues or limb pain after  standing. Baseline: SEE OBJECTIVE DATA Goal status:  MET 06/17/2022   3. Berg Balance >45/56 Baseline: SEE OBJECTIVE DATA Goal status:  Ongoing 05/29/2022   4. Patient ambulates 400' with prosthesis with supervision. Baseline: SEE OBJECTIVE DATA Goal status:  MET 06/24/2022   5. Patient negotiates ramps & curbs with prosthesis with supervision Baseline: SEE OBJECTIVE DATA Goal status:  MET 06/24/2022   LONG TERM GOALS: Target date: 07/31/2021   Patient demonstrates & verbalized understanding of prosthetic care to enable safe utilization of prosthesis. Baseline: SEE OBJECTIVE DATA Goal status: Ongoing 05/29/2022   Patient tolerates prosthesis wear >90% of awake hours without skin or limb pain issues. Baseline: SEE OBJECTIVE DATA Goal status: Ongoing 05/29/2022   Functional Gait Assessment >22/30 to indicate lower fall risk Baseline: SEE OBJECTIVE DATA Goal status: Ongoing 05/29/2022   Patient ambulates >500' with prosthesis only independently Baseline: SEE OBJECTIVE DATA Goal status: Ongoing 05/29/2022   Patient negotiates ramps, curbs & stairs with single rail with prosthesis only independently. Baseline: SEE OBJECTIVE DATA Goal status: Ongoing 05/29/2022   Patient demonstrates & verbalizes lifting, carrying, pushing, pulling with prosthesis only safely. Baseline: SEE OBJECTIVE DATA Goal status:  Ongoing 05/29/2022     PLAN:   PT FREQUENCY: 3x/week for 5 weeks, then 1-2 x/wk for 10 weeks   PT DURATION: other: 15 weeks   PLANNED INTERVENTIONS: Therapeutic exercises, Therapeutic activity, Neuromuscular re-education, Balance training, Gait training, Patient/Family education, Self Care, Stair training, Vestibular training, Prosthetic training, DME instructions, and physical performance testing   PLAN FOR NEXT SESSION:  continue work simulation,   continue push/pull with pulley weights  Jamey Reas, PT, DPT 07/01/2022, 8:46 AM

## 2022-07-03 ENCOUNTER — Encounter: Payer: Self-pay | Admitting: Nurse Practitioner

## 2022-07-03 ENCOUNTER — Encounter: Payer: 59 | Admitting: Physical Therapy

## 2022-07-06 ENCOUNTER — Other Ambulatory Visit: Payer: Self-pay | Admitting: Endocrinology

## 2022-07-06 DIAGNOSIS — E1165 Type 2 diabetes mellitus with hyperglycemia: Secondary | ICD-10-CM

## 2022-07-07 ENCOUNTER — Encounter: Payer: 59 | Admitting: Physical Therapy

## 2022-07-07 NOTE — Therapy (Incomplete)
OUTPATIENT PHYSICAL THERAPY TREATMENT NOTE   Patient Name: Raymond [name] MRN: [id number] DOB:09-21-1960, 62 y.o., male Today's Date: 07/07/2022  PCP: [name], FNP  REFERRING PROVIDER: Newt Minion, MD      END OF SESSION:                    Past Medical History:  Diagnosis Date   Diabetes [facility])    Diabetes mellitus without complication ([place])    type 2   ESRD (end stage renal disease) ([place])    03/09/2015- patient had a kidney transplant   Hypertension    Peripheral vascular disease ([place])    Renal disorder    Renal insufficiency    Past Surgical History:  Procedure Laterality Date   ABDOMINAL AORTOGRAM W/LOWER EXTREMITY N/A 09/11/2021   Procedure: ABDOMINAL AORTOGRAM W/LOWER EXTREMITY;  Surgeon: Wellington Hampshire, MD;  Location: [facility];  Service: Cardiovascular;  Laterality: N/A;   AMPUTATION Right 09/25/2021   Procedure: RIGHT TRANSMETATARSAL AMPUTATION AND APPLY TISSUE GRAFT;  Surgeon: Newt Minion, MD;  Location: [place];  Service: Orthopedics;  Laterality: Right;   AMPUTATION Right 11/16/2021   Procedure: RIGHT AMPUTATION BELOW KNEE WITH WOUND VAC APPLICATION;  Surgeon: Newt Minion, MD;  Location: [place];  Service: Orthopedics;  Laterality: Right;   BONE BIOPSY Right 03/06/2021   Procedure: BONE BIOPSY;  Surgeon: Trula Slade, DPM;  Location: [facility];  Service: Podiatry;  Laterality: Right;   CENTRAL VENOUS CATHETER INSERTION Right 01/20/2020   Procedure: INSERTION CENTRAL LINE ADULT; Tunneled central line;  Surgeon: Virl Cagey, MD;  Location: [facility];  Service: General;  Laterality: Right;   COLONOSCOPY N/A 12/05/2014   [id number] external and internal hemorrhoid/mild diverticulosis/11 polyps removed   ESOPHAGOGASTRODUODENOSCOPY N/A 12/05/2014   [id number] duodenitis   GRAFT APPLICATION Right [id number]   Procedure: GRAFT APPLICATION;  Surgeon: Trula Slade, DPM;  [facility]: [facility];  Service: Podiatry;   Laterality: Right;   INCISION AND DRAINAGE OF WOUND Right 08/15/2021   Procedure: IRRIGATION AND DEBRIDEMENT WOUND;  Surgeon: Landis Martins, DPM;  Location: [facility];  Service: Podiatry;  Laterality: Right;  need to make card not  a irrigation and debridement card   IR FLUORO GUIDE CV LINE RIGHT  03/01/2019   IR PERC TUN PERIT CATH WO PORT S&I /IMAG  06/27/2021   IR REMOVAL TUN CV CATH W/O FL  05/10/2019   IR REMOVAL TUN CV CATH W/O FL  02/29/2020   IR REMOVAL TUN CV CATH W/O FL  07/19/2021   IR US GUIDE VASC ACCESS RIGHT  03/01/2019   IR US GUIDE VASC ACCESS RIGHT  06/27/2021   IRRIGATION AND DEBRIDEMENT ELBOW Left 06/24/2021   Procedure: IRRIGATION AND DEBRIDEMENT ELBOW;  Surgeon: Lovell Sheehan, MD;  Location: [facility];  Service: Orthopedics;  Laterality: Left;   KIDNEY TRANSPLANT  03/27/2015   Penile Pump Insertion     PERIPHERAL VASCULAR ATHERECTOMY  09/11/2021   Procedure: PERIPHERAL VASCULAR ATHERECTOMY;  Surgeon: Wellington Hampshire, MD;  Location: [facility] [facility];  Service: Cardiovascular;;  Shockwave Lithotripsy   TEE WITHOUT CARDIOVERSION N/A 01/17/2020   Procedure: TRANSESOPHAGEAL ECHOCARDIOGRAM (TEE) WITH PROPOFOL;  Surgeon: Arnoldo Lenis, MD;  [facility];  Service: Endoscopy;  Laterality: N/A;   WOUND DEBRIDEMENT Right 03/06/2021   Procedure: DEBRIDEMENT WOUND;  Surgeon: Trula Slade, DPM;  Location: [facility];  Service: Podiatry;  Laterality: Right;   Patient Active Problem List  Diagnosis Date Noted   Acquired absence of right leg below knee ([place]) 06/18/2022   Chronic kidney disease, stage 5 (HCC) 06/18/2022   Cutaneous abscess of right foot    Hyperkalemia 08/28/2021   Obesity (BMI 30-39.9) 08/28/2021   History of anemia due to chronic kidney disease 08/28/2021   Diabetic foot ulcer ([place]) 08/14/2021   Hypomagnesemia 06/25/2021   Peripheral vascular disease ([place]) 04/24/2020   Thrombophlebitis of superficial veins of right lower extremity    Difficult  intravenous access    MRSA bacteremia    Diabetic foot infection ([place])    Infected wound 01/12/2020   Critical lower limb ischemia ([place]) 02/02/2019   Personal history of noncompliance with medical treatment, presenting hazards to health 10/07/2018   Critical limb ischemia with history of revascularization of same extremity ([place]) 07/27/2018   Mixed hyperlipidemia 02/19/2018   History of renal transplant 02/19/2018   Conductive hearing loss, bilateral 08/31/2017   Nuclear sclerotic cataract of right eye 10/07/2016   Pseudophakia of left eye 10/07/2016   Hypertension due to endocrine disorder 03/04/2016   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 ([place]) 03/04/2016   Renal failure 03/04/2016   Proliferative diabetic retinopathy with macular edema associated with type 2 diabetes mellitus ([place]) 03/04/2016   Immunosuppression ([place]) 12/10/2015   Nausea vomiting and diarrhea 12/10/2015   Type 2 diabetes mellitus ([place]) 12/10/2015   Essential hypertension, benign 12/10/2015   Pancytopenia ([place]) 12/10/2015   Encounter for screening colonoscopy 11/15/2014   GERD (gastroesophageal reflux disease) 11/15/2014    REFERRING DIAG: [id number] (ICD-10-CM) - Below-knee amputation of right lower extremit   ONSET DATE: 04/10/2022 prosthesis delivery   THERAPY DIAG:  No diagnosis found.  Rationale for Evaluation and Treatment Rehabilitation  PERTINENT HISTORY: DM2, PVD, ESRD s/p renal transplant 03/09/2015 on immunosuppressive meds, HTN   PRECAUTIONS: None  SUBJECTIVE:                                                                                                                                                                                      SUBJECTIVE STATEMENT:   *** He did band in door as PT advised yesterday with no issues.  He has difficulty getting into his high truck.   PAIN:  Are you having pain? Residual limb pain  0/10  soreness from wearing prosthesis without ability to seat limb into socket.   OBJECTIVE: (objective measures completed at initial evaluation unless otherwise dated) POSTURE: rounded shoulders, forward head, flexed trunk , and weight shift left   LOWER EXTREMITY ROM:   ROM P:passive  A:active Right eval Left eval  Hip flexion  Hip extension      Hip abduction      Hip adduction      Hip internal rotation      Hip external rotation      Knee flexion 100*    Knee extension -5*    Ankle dorsiflexion      Ankle plantarflexion      Ankle inversion      Ankle eversion       (Blank rows = not tested)   LOWER EXTREMITY MMT:   MMT Right eval Left eval  Hip flexion 3+/5    Hip extension 3-/5    Hip abduction 3-/5    Hip adduction      Hip internal rotation      Hip external rotation      Knee flexion 3-/5    Knee extension 3/5    Ankle dorsiflexion      Ankle plantarflexion      Ankle inversion      Ankle eversion      (Blank rows = not tested)   TRANSFERS: Sit to stand: 18" chair with armrests to RW with supervision.  Stand to sit: RW to 18" chair with armrests with supervision   PROSTHETIC GAIT WITH BKA PROSTHESIS: 06/30/2022: pt amb 400' with prosthesis only with lateral trunk lean towards prosthesis in stance.  No loss of balance noted.  Gait Velocity: 2.01 ft/sec   04/16/2023: Gait pattern: step to pattern, decreased arm swing- Right, decreased step length- Right, decreased stance time- Left, decreased stride length, decreased hip/knee flexion- Right, circumduction- Right, Right hip hike, knee flexed in stance- Right, antalgic, lateral hip instability, trunk flexed, and abducted- Right Distance walked: 31' with RW & 20' with cane Assistive device utilized: Single point cane, Walker - 2 wheeled, and BKA prosthesis Level of assistance: SBA with RW and Min A Gait velocity: 0.89 ft/sec Comments: excessive weight bearing on RW, partial weight on prosthesis    FUNCTIONAL TESTs:  06/24/2022: Merrilee Jansky Balance 46/56  05/12/2022:  Merrilee Jansky Balance test 42/56 TUG with prosthesis only: std 9.96sec & cog 10.94sec  04/15/2022: Timed up and go (TUG): 39.25sec with cane & BKA prosthesis Berg Balance Scale: 24/56     CURRENT PROSTHETIC WEAR ASSESSMENT: Patient is dependent with: skin check, residual limb care, care of non-amputated limb, prosthetic cleaning, ply sock cleaning, correct ply sock adjustment, proper wear schedule/adjustment, and proper weight-bearing schedule/adjustment Donning prosthesis: Min A Doffing prosthesis: SBA Prosthetic wear tolerance: 3 hours, 1-2x/day, 5 of 5 days since delivery Prosthetic weight bearing tolerance: 5 minutes Edema: pitting. Residual limb condition: 84m scab wound on medial incision, dry skin, normal color & temperature, cylindrical shape Prosthetic description: silicon liner with pin lock suspension, total contact socket, hydraulic ankle / K3 foot K code/activity level with prosthetic use: Level 3       TODAY'S TREATMENT:  DATE:  07/07/2022: ***  07/01/2022: Therapeutic Activities: PT adjusted mat table similar to height of truck seat.  PT demo & verbal cues for getting in & out. Pt return demo understanding. Lifting box & carrying 120' with BUEs. 20#, 30# & 40# 1 rep ea.  Verbal cues  on prosthetic foot position for lift & lowering to floor. PT demo & verbal cues on moving 40# box 90* & push onto cart / truck bed then pulling off cart, turning 90* & lowering to floor.  Pt return demo 5 reps with correctional cues. Pt verbalized better understanding at end of activity. Push/pull walking forward/back 40# cable wt (20# per stack) 5 reps wt anterior & 5 reps wt posterior. 20# side stepping 10 reps.  Push & pull in straddle step with wt shift 40# (20# per UE) wt posterior with forward reach 10  reps & anterior with row 10 reps.  Pulley wt 5# hip add, flex SLR, flex bending knee, abd, ext 10 reps ea LE with single UE support. Cues on upright trunk / not leaning.    06/30/2022: Prosthetic Training with TTA prosthesis: PT educated pt with verbal discussion on fall risk when prosthesis is off and need for w/c or RW close at night when off. Using prosthesis to enter/exit bathroom for showering.  PT also discussed traveling staying with family/friends & in hotel and flying.  Pt verbalized understanding.  He continues to report wear all awake hours. Weighing each morning for fluid retention.  He reports he has noticed significant change in his current mobility with this.   Therapeutic Activities Push/pull walking forward/back 30# cable wt (15# per stack) 5 reps wt anterior & 5 reps wt posterior. 20# side stepping 10 reps.  Push & pull in straddle step with wt shift 40# (20# per UE) wt posterior with forward reach 10 reps & anterior with row 10 reps.  Lifting box & carrying 100' with BUEs. 20# 2 reps, 30# & 40# 1 rep ea.  Verbal cues  on prosthetic foot position for lift & lowering to floor. PT educated in resistive gait with black theraband on pelvis walking away from door & back to door controlled against resistance.  3 reps ea anterior pelvis to door, right side to door, posterior pelvis to door & left side to door.  Pt verbalized & return demo understanding as HEP.  Black theraband for push & pull with weight shift between LEs (prosthesis forward in step position). 10 reps ea.  Pt verbalized & return demo understanding as HEP.     HOME EXERCISE PROGRAM: Access Code: [facility] URL: [email or web address] Date: 05/22/2022 Prepared by: Jamey Reas  Exercises - Hip Flexor Stretch at [facility] of Bed  - 1-3 x daily - 7 x weekly - 1 sets - 3 reps - 20-30 seconds hold - Supine Quadriceps Stretch with Strap on Table  - 1-3 x daily - 7 x weekly - 1 sets - 3 reps - 20-30 seconds hold -  Supine Bridge  - 1 x daily - 7 x weekly - 2-3 sets - 10 reps - 5 seconds hold - Marching Bridge  - 1 x daily - 7 x weekly - 2-3 sets - 10 reps - 5 seconds hold - Standing Terminal Knee Extension with Resistance  - 1 x daily - 7 x weekly - 2-3 sets - 10 reps - 5 seconds hold - Standing Hip Flexion with Resistance (Mirrored)  - 1 x daily - 7 x weekly - 1 sets -  10 reps - Standing Hip Adduction with Resistance (Mirrored)  - 1 x daily - 7 x weekly - 1 sets - 10 reps - Standing Hip Extension with Resistance  - 1 x daily - 7 x weekly - 1 sets - 10 reps - Standing Hip Abduction with Theraband Resistance  - 1 x daily - 7 x weekly - 1 sets - 10 reps - Standing Tandem Balance with Counter Support  - 1 x daily - 7 x weekly - 1 sets - 3-4 reps - 30 seconds hold - Tandem Walking with Counter Support  - 1 x daily - 7 x weekly - 1 sets - 6 reps - single leg stance with one foot in lower cabinet  - 1 x daily - 7 x weekly - 1 sets - 5-10 reps - 10 seconds hold - [place]arioca with Counter Support  - 1 x daily - 7 x weekly - 1 sets - 10 reps - Alternating Punch with Resistance  - 1 x daily - 5 x weekly - 1 sets - 10 reps - 5 seconds hold - Standing Scapular Protraction with Resistance  - 1 x daily - 7 x weekly - 1 sets - 10 reps - 5 seconds hold - Standing alternate rows with resistance  - 1 x daily - 7 x weekly - 1 sets - 10 reps - 5 seconds hold - Standing Row with Anchored Resistance  - 1 x daily - 7 x weekly - 1 sets - 10 reps - 5 seconds hold - Alternating elbow flexion with resistance  - 1 x daily - 7 x weekly - 1 sets - 10 reps - 5 seconds hold - Standing Bicep Curls with Resistance  - 1 x daily - 7 x weekly - 1 sets - 10 reps - 5 seconds hold   Do each exercise 1-2  times per day Do each exercise 5-10 repetitions Hold each exercise for 2 seconds to feel your location  AT [place].  Try to find this position when standing still for  activities.   USE TAPE ON FLOOR TO MARK THE MIDLINE POSITION which is even with middle of sink.  You also should try to feel with your limb pressure in socket.  You are trying to feel with limb what you used to feel with the bottom of your foot.  Side to Side Shift: Moving your hips only (not shoulders): move weight onto your left leg, HOLD/FEEL pressure in socket.  Move back to equal weight on each leg, HOLD/FEEL pressure in socket. Move weight onto your right leg, HOLD/FEEL pressure in socket. Move back to equal weight on each leg, HOLD/FEEL pressure in socket. Repeat.  Start with both hands on sink, progress to hand on prosthetic side only, then no hands.  Front to Back Shift: Moving your hips only (not shoulders): move your weight forward onto your toes, HOLD/FEEL pressure in socket. Move your weight back to equal Flat Foot on both legs, HOLD/FEEL  pressure in socket. Move your weight back onto your heels, HOLD/FEEL  pressure in socket. Move your weight back to equal on both legs, HOLD/FEEL  pressure in socket. Repeat.  Start with both hands on sink, progress to hand on prosthetic side only, then no hands.  Moving Cones / Cups: With equal weight on each leg: Hold on with one hand the first time, then progress to no hand supports. Move cups from one side  of sink to the other. Place cups ~2" out of your reach, progress to 10" beyond reach.  Place one hand in middle of sink and reach with other hand. Do both arms.  Then hover one hand and move cups with other hand.  Overhead/Upward Reaching: alternated reaching up to top cabinets or ceiling if no cabinets present. Keep equal weight on each leg. Start with one hand support on counter while other hand reaches and progress to no hand support with reaching.  ace one hand in middle of sink and reach with other hand. Do both arms.  Then hover one hand and move cups with other hand.  5.   Looking Over Shoulders: With equal weight on each leg: alternate turning to  look over your shoulders with one hand support on counter as needed.  Start with head motions only to look in front of shoulder, then even with shoulder and progress to looking behind you. To look to side, move head /eyes, then shoulder on side looking pulls back, shift more weight to side looking and pull hip back. Place one hand in middle of sink and let go with other hand so your shoulder can pull back. Switch hands to look other way.   Then hover one hand and look over shoulder. If looking right, use left hand at sink. If looking left, use right hand at sink. 6.  Stepping with leg that is not amputated:  Move items under cabinet out of your way. Shift your hips/pelvis so weight on prosthesis. Tighten muscles in hip on prosthetic side.  SLOWLY step other leg so front of foot is in cabinet. Then step back to floor.      ASSESSMENT:  CLINICAL IMPRESSION: ***  PT progressed push, pull, lifting into simulated tasks that he combined motions that PT has been working on.  He improved with instruction & repetition. He also reports instruction for getting in & out of truck should help.    Pt continues to benefit from skilled PT.  OBJECTIVE IMPAIRMENTS: Abnormal gait, decreased activity tolerance, decreased balance, decreased endurance, decreased knowledge of condition, decreased knowledge of use of DME, decreased mobility, decreased ROM, decreased strength, increased edema, impaired flexibility, postural dysfunction, prosthetic dependency , obesity, and wound on residual limb .    ACTIVITY LIMITATIONS: carrying, lifting, bending, standing, squatting, stairs, transfers, locomotion level, and prosthetic use   PARTICIPATION LIMITATIONS: meal prep, cleaning, laundry, driving, community activity, occupation, and yard work   PERSONAL FACTORS: Fitness, Time since onset of injury/illness/exacerbation, and 3+ comorbidities: see PMH  are also affecting patient's functional outcome.    REHAB POTENTIAL: Good    CLINICAL DECISION MAKING: Evolving/moderate complexity   EVALUATION COMPLEXITY: Moderate     GOALS: Goals reviewed with patient? Yes   SHORT TERM GOALS: Target date: 06/20/2022   Patient verbalizes how to adjust ply socks. Baseline: SEE OBJECTIVE DATA Goal status: MET 06/24/2022 2.  Patient tolerates prosthesis >90% awake hrs /day without skin issues or limb pain after standing. Baseline: SEE OBJECTIVE DATA Goal status:  MET 06/17/2022   3. Berg Balance >45/56 Baseline: SEE OBJECTIVE DATA Goal status:  MET 06/24/2022   4. Patient ambulates 400' with prosthesis with supervision. Baseline: SEE OBJECTIVE DATA Goal status:  MET 06/24/2022   5. Patient negotiates ramps & curbs with prosthesis with supervision Baseline: SEE OBJECTIVE DATA Goal status:  MET 06/24/2022   LONG TERM GOALS: Target date: 07/31/2021   Patient demonstrates & verbalized understanding of prosthetic care to enable safe utilization  of prosthesis. Baseline: SEE OBJECTIVE DATA Goal status: Ongoing 05/29/2022   Patient tolerates prosthesis wear >90% of awake hours without skin or limb pain issues. Baseline: SEE OBJECTIVE DATA Goal status: Ongoing 05/29/2022   Functional Gait Assessment >22/30 to indicate lower fall risk Baseline: SEE OBJECTIVE DATA Goal status: Ongoing 05/29/2022   Patient ambulates >500' with prosthesis only independently Baseline: SEE OBJECTIVE DATA Goal status: Ongoing 05/29/2022   Patient negotiates ramps, curbs & stairs with single rail with prosthesis only independently. Baseline: SEE OBJECTIVE DATA Goal status: Ongoing 05/29/2022   Patient demonstrates & verbalizes lifting, carrying, pushing, pulling with prosthesis only safely. Baseline: SEE OBJECTIVE DATA Goal status:  Ongoing 05/29/2022     PLAN:   PT FREQUENCY: 3x/week for 5 weeks, then 1-2 x/wk for 10 weeks   PT DURATION: other: 15 weeks   PLANNED INTERVENTIONS: Therapeutic exercises, Therapeutic activity, Neuromuscular  re-education, Balance training, Gait training, Patient/Family education, Self Care, Stair training, Vestibular training, Prosthetic training, DME instructions, and physical performance testing   PLAN FOR NEXT SESSION: ***  continue work simulation,   continue push/pull with pulley weights  Jamey Reas, PT, DPT 07/07/2022, 7:26 AM

## 2022-07-08 ENCOUNTER — Ambulatory Visit (INDEPENDENT_AMBULATORY_CARE_PROVIDER_SITE_OTHER): Payer: 59 | Admitting: Physical Therapy

## 2022-07-08 ENCOUNTER — Encounter: Payer: Self-pay | Admitting: Physical Therapy

## 2022-07-08 DIAGNOSIS — R2681 Unsteadiness on feet: Secondary | ICD-10-CM | POA: Diagnosis not present

## 2022-07-08 DIAGNOSIS — S81801A Unspecified open wound, right lower leg, initial encounter: Secondary | ICD-10-CM

## 2022-07-08 DIAGNOSIS — M6281 Muscle weakness (generalized): Secondary | ICD-10-CM

## 2022-07-08 DIAGNOSIS — R2689 Other abnormalities of gait and mobility: Secondary | ICD-10-CM | POA: Diagnosis not present

## 2022-07-08 NOTE — Therapy (Signed)
OUTPATIENT PHYSICAL THERAPY TREATMENT NOTE   Patient Name: Raymond [name] MRN: [id number] DOB:November 14, 1960, 62 y.o., male Today's Date: 07/08/2022  PCP: [name], FNP  REFERRING PROVIDER: Newt Minion, MD      END OF SESSION:   PT End of Session - 07/08/22 0800     Visit Number 22    Number of Visits 25    Date for PT Re-Evaluation 08/01/22    Authorization Type AET[employer]    Authorization Time Period NO COPAY, 35 PT/OT VISITS Calendar Year Benefit Date 05/26/2021 - 05/25/2022    Authorization - Number of Visits 35    Progress Note Due on Visit 30    PT Start Time 0800    PT Stop Time 0842    PT Time Calculation (min) 42 min    Equipment Utilized During Treatment Gait belt    Activity Tolerance Patient tolerated treatment well    Behavior During Therapy WFL for tasks assessed/performed                             Past Medical History:  Diagnosis Date   Diabetes ([place])    Diabetes mellitus without complication ([place])    type 2   ESRD (end stage renal disease) ([place])    03/09/2015- patient had a kidney transplant   Hypertension    Peripheral vascular disease ([place])    Renal disorder    Renal insufficiency    Past Surgical History:  Procedure Laterality Date   ABDOMINAL AORTOGRAM W/LOWER EXTREMITY N/A 09/11/2021   Procedure: ABDOMINAL AORTOGRAM W/LOWER EXTREMITY;  Surgeon: Wellington Hampshire, MD;  Location: [facility];  Service: Cardiovascular;  Laterality: N/A;   AMPUTATION Right 09/25/2021   Procedure: RIGHT TRANSMETATARSAL AMPUTATION AND APPLY TISSUE GRAFT;  Surgeon: Newt Minion, MD;  Location: [facility];  Service: Orthopedics;  Laterality: Right;   AMPUTATION Right 11/16/2021   Procedure: RIGHT AMPUTATION BELOW KNEE WITH WOUND VAC APPLICATION;  Surgeon: Newt Minion, MD;  Location: [facility];  Service: Orthopedics;  Laterality: Right;   BONE BIOPSY Right 03/06/2021   Procedure: BONE BIOPSY;  Surgeon: Trula Slade, DPM;  [facility]: [facility];  Service: Podiatry;  Laterality: Right;   CENTRAL VENOUS CATHETER INSERTION Right 01/20/2020   Procedure: INSERTION CENTRAL LINE ADULT; Tunneled central line;  Surgeon: Virl Cagey, MD;  Location: [facility];  Service: General;  Laterality: Right;   COLONOSCOPY N/A 12/05/2014   [id number] external and internal hemorrhoid/mild diverticulosis/11 polyps removed   ESOPHAGOGASTRODUODENOSCOPY N/A 12/05/2014   [id number] duodenitis   GRAFT APPLICATION Right [id number]   Procedure: GRAFT APPLICATION;  Surgeon: Trula Slade, DPM;  Location: [facility];  Service: Podiatry;  Laterality: Right;   INCISION AND DRAINAGE OF WOUND Right 08/15/2021   Procedure: IRRIGATION AND DEBRIDEMENT WOUND;  Surgeon: Landis Martins, DPM;  Location: [facility];  Service: Podiatry;  Laterality: Right;  need to make card not  a irrigation and debridement card   IR FLUORO GUIDE CV LINE RIGHT  03/01/2019   IR PERC TUN PERIT CATH WO PORT S&I /IMAG  06/27/2021   IR REMOVAL TUN CV CATH W/O FL  05/10/2019   IR REMOVAL TUN CV CATH W/O FL  02/29/2020   IR REMOVAL TUN CV CATH W/O FL  07/19/2021   IR US GUIDE VASC ACCESS RIGHT  03/01/2019   IR US GUIDE VASC ACCESS RIGHT  06/27/2021   IRRIGATION AND DEBRIDEMENT ELBOW Left 06/24/2021  Procedure: IRRIGATION AND DEBRIDEMENT ELBOW;  Surgeon: Lovell Sheehan, MD;  Location: [facility];  Service: Orthopedics;  Laterality: Left;   KIDNEY TRANSPLANT  03/27/2015   Penile Pump Insertion     PERIPHERAL VASCULAR ATHERECTOMY  09/11/2021   Procedure: PERIPHERAL VASCULAR ATHERECTOMY;  Surgeon: Wellington Hampshire, MD;  Location: [facility];  Service: Cardiovascular;;  Shockwave Lithotripsy   TEE WITHOUT CARDIOVERSION N/A 01/17/2020   Procedure: TRANSESOPHAGEAL ECHOCARDIOGRAM (TEE) WITH PROPOFOL;  Surgeon: Arnoldo Lenis, MD;  [facility];  Service: Endoscopy;  Laterality: N/A;   WOUND DEBRIDEMENT Right 03/06/2021   Procedure: DEBRIDEMENT WOUND;  Surgeon: Trula Slade,  DPM;  Location[facility];  Service: Podiatry;  Laterality: Right;   Patient Active Problem List   Diagnosis Date Noted   Acquired absence of right leg below knee ([place]) 06/18/2022   Chronic kidney disease, stage 5 ([place]) 06/18/2022   Cutaneous abscess of right foot    Hyperkalemia 08/28/2021   Obesity (BMI 30-39.9) 08/28/2021   History of anemia due to chronic kidney disease 08/28/2021   Diabetic foot ulcer ([place]) 08/14/2021   Hypomagnesemia 06/25/2021   Peripheral vascular disease ([place]) 04/24/2020   Thrombophlebitis of superficial veins of right lower extremity    Difficult intravenous access    MRSA bacteremia    Diabetic foot infection ([place])    Infected wound 01/12/2020   Critical lower limb ischemia ([place]) 02/02/2019   Personal history of noncompliance with medical treatment, presenting hazards to health 10/07/2018   Critical limb ischemia with history of revascularization of same extremity ([place]) 07/27/2018   Mixed hyperlipidemia 02/19/2018   History of renal transplant 02/19/2018   Conductive hearing loss, bilateral 08/31/2017   Nuclear sclerotic cataract of right eye 10/07/2016   Pseudophakia of left eye 10/07/2016   Hypertension due to endocrine disorder 03/04/2016   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 ([place]) 03/04/2016   Renal failure 03/04/2016   Proliferative diabetic retinopathy with macular edema associated with type 2 diabetes mellitus ([place]) 03/04/2016   Immunosuppression ([place]) 12/10/2015   Nausea vomiting and diarrhea 12/10/2015   Type 2 diabetes mellitus ([place]) 12/10/2015   Essential hypertension, benign 12/10/2015   Pancytopenia ([place]) 12/10/2015   Encounter for screening colonoscopy 11/15/2014   GERD (gastroesophageal reflux disease) 11/15/2014    REFERRING DIAG: [id number] (ICD-10-CM) - Below-knee amputation of right lower extremit   ONSET DATE: 04/10/2022 prosthesis delivery   THERAPY DIAG:   Unsteadiness on feet  Other abnormalities of gait and mobility  Muscle weakness (generalized)  Wound of right lower extremity, initial encounter  Rationale for Evaluation and Treatment Rehabilitation  PERTINENT HISTORY: DM2, PVD, ESRD s/p renal transplant 03/09/2015 on immunosuppressive meds, HTN   PRECAUTIONS: None  SUBJECTIVE:  SUBJECTIVE STATEMENT:   He missed yesterday with low blood sugar.  His left side of low thoracic region started hurting. His morning weighs is staying steady.  He sees his kidney doctor on 2/29 & imaging 2/15.  He sees endocrinologist today.   PAIN:  Are you having pain? Residual limb pain  0/10 soreness from wearing prosthesis without ability to seat limb into socket.   OBJECTIVE: (objective measures completed at initial evaluation unless otherwise dated) POSTURE: rounded shoulders, forward head, flexed trunk , and weight shift left   LOWER EXTREMITY ROM:   ROM P:passive  A:active Right eval Left eval  Hip flexion      Hip extension      Hip abduction      Hip adduction      Hip internal rotation      Hip external rotation      Knee flexion 100*    Knee extension -5*    Ankle dorsiflexion      Ankle plantarflexion      Ankle inversion      Ankle eversion       (Blank rows = not tested)   LOWER EXTREMITY MMT:   MMT Right eval Left eval  Hip flexion 3+/5    Hip extension 3-/5    Hip abduction 3-/5    Hip adduction      Hip internal rotation      Hip external rotation      Knee flexion 3-/5    Knee extension 3/5    Ankle dorsiflexion      Ankle plantarflexion      Ankle inversion      Ankle eversion      (Blank rows = not tested)   TRANSFERS: Sit to stand: 18" chair with armrests to RW with supervision.  Stand to sit: RW to 18" chair with armrests  with supervision   PROSTHETIC GAIT WITH BKA PROSTHESIS: 06/30/2022: pt amb 400' with prosthesis only with lateral trunk lean towards prosthesis in stance.  No loss of balance noted.  Gait Velocity: 2.01 ft/sec   04/16/2023: Gait pattern: step to pattern, decreased arm swing- Right, decreased step length- Right, decreased stance time- Left, decreased stride length, decreased hip/knee flexion- Right, circumduction- Right, Right hip hike, knee flexed in stance- Right, antalgic, lateral hip instability, trunk flexed, and abducted- Right Distance walked: 43' with RW & 20' with cane Assistive device utilized: Single point cane, Walker - 2 wheeled, and BKA prosthesis Level of assistance: SBA with RW and Min A Gait velocity: 0.89 ft/sec Comments: excessive weight bearing on RW, partial weight on prosthesis   FUNCTIONAL TESTs:  06/24/2022: Merrilee Jansky Balance 46/56  05/12/2022:  Merrilee Jansky Balance test 42/56 TUG with prosthesis only: std 9.96sec & cog 10.94sec  04/15/2022: Timed up and go (TUG): 39.25sec with cane & BKA prosthesis Berg Balance Scale: 24/56     CURRENT PROSTHETIC WEAR ASSESSMENT: Patient is dependent with: skin check, residual limb care, care of non-amputated limb, prosthetic cleaning, ply sock cleaning, correct ply sock adjustment, proper wear schedule/adjustment, and proper weight-bearing schedule/adjustment Donning prosthesis: Min A Doffing prosthesis: SBA Prosthetic wear tolerance: 3 hours, 1-2x/day, 5 of 5 days since delivery Prosthetic weight bearing tolerance: 5 minutes Edema: pitting. Residual limb condition: 71m scab wound on medial incision, dry skin, normal color & temperature, cylindrical shape Prosthetic description: silicon liner with pin lock suspension, total contact socket, hydraulic ankle / K3 foot K code/activity level with prosthetic use: Level 3  TODAY'S TREATMENT:                                                                                                                               DATE:  07/08/2022: Therapeutic Exercise: PT instructed pt in supine back stretches 15 sec hold 2 reps ea - single knee to chest, double knee to chest, trunk rotation & hip rotation windshield wiper. Pt verbalized & return demo understanding.   Therapeutic Activities: PT demo and verbal cues on proper technique for supine to and from sit.  Patient to return demonstration. PT demo and verbal cues on using pillows for positioning in side-lying and tenting sheets off foot.  Patient verbalized understanding and reports less back strain. PT demo and verbal cues on using running board to enter and exit truck.  Patient able to return demonstration and reports easier.  07/01/2022: Therapeutic Activities: PT adjusted mat table similar to height of truck seat.  PT demo & verbal cues for getting in & out. Pt return demo understanding. Lifting box & carrying 120' with BUEs. 20#, 30# & 40# 1 rep ea.  Verbal cues  on prosthetic foot position for lift & lowering to floor. PT demo & verbal cues on moving 40# box 90* & push onto cart / truck bed then pulling off cart, turning 90* & lowering to floor.  Pt return demo 5 reps with correctional cues. Pt verbalized better understanding at end of activity. Push/pull walking forward/back 40# cable wt (20# per stack) 5 reps wt anterior & 5 reps wt posterior. 20# side stepping 10 reps.  Push & pull in straddle step with wt shift 40# (20# per UE) wt posterior with forward reach 10 reps & anterior with row 10 reps.  Pulley wt 5# hip add, flex SLR, flex bending knee, abd, ext 10 reps ea LE with single UE support. Cues on upright trunk / not leaning.    06/30/2022: Prosthetic Training with TTA prosthesis: PT educated pt with verbal discussion on fall risk when prosthesis is off and need for w/c or RW close at night when off. Using prosthesis to enter/exit bathroom for showering.  PT also discussed traveling staying with family/friends & in hotel  and flying.  Pt verbalized understanding.  He continues to report wear all awake hours. Weighing each morning for fluid retention.  He reports he has noticed significant change in his current mobility with this.   Therapeutic Activities Push/pull walking forward/back 30# cable wt (15# per stack) 5 reps wt anterior & 5 reps wt posterior. 20# side stepping 10 reps.  Push & pull in straddle step with wt shift 40# (20# per UE) wt posterior with forward reach 10 reps & anterior with row 10 reps.  Lifting box & carrying 100' with BUEs. 20# 2 reps, 30# & 40# 1 rep ea.  Verbal cues  on prosthetic foot position for lift & lowering to floor. PT educated in resistive gait with black theraband on  pelvis walking away from door & back to door controlled against resistance.  3 reps ea anterior pelvis to door, right side to door, posterior pelvis to door & left side to door.  Pt verbalized & return demo understanding as HEP.  Black theraband for push & pull with weight shift between LEs (prosthesis forward in step position). 10 reps ea.  Pt verbalized & return demo understanding as HEP.     HOME EXERCISE PROGRAM: Access Code: [facility] URL: [email or web address] Date: 05/22/2022 Prepared by: Jamey Reas  Exercises - Hip Flexor Stretch at [facility] of Bed  - 1-3 x daily - 7 x weekly - 1 sets - 3 reps - 20-30 seconds hold - Supine Quadriceps Stretch with Strap on Table  - 1-3 x daily - 7 x weekly - 1 sets - 3 reps - 20-30 seconds hold - Supine Bridge  - 1 x daily - 7 x weekly - 2-3 sets - 10 reps - 5 seconds hold - Marching Bridge  - 1 x daily - 7 x weekly - 2-3 sets - 10 reps - 5 seconds hold - Standing Terminal Knee Extension with Resistance  - 1 x daily - 7 x weekly - 2-3 sets - 10 reps - 5 seconds hold - Standing Hip Flexion with Resistance (Mirrored)  - 1 x daily - 7 x weekly - 1 sets - 10 reps - Standing Hip Adduction with Resistance (Mirrored)  - 1 x daily - 7 x weekly - 1 sets - 10 reps -  Standing Hip Extension with Resistance  - 1 x daily - 7 x weekly - 1 sets - 10 reps - Standing Hip Abduction with Theraband Resistance  - 1 x daily - 7 x weekly - 1 sets - 10 reps - Standing Tandem Balance with Counter Support  - 1 x daily - 7 x weekly - 1 sets - 3-4 reps - 30 seconds hold - Tandem Walking with Counter Support  - 1 x daily - 7 x weekly - 1 sets - 6 reps - single leg stance with one foot in lower cabinet  - 1 x daily - 7 x weekly - 1 sets - 5-10 reps - 10 seconds hold - [place] with Counter Support  - 1 x daily - 7 x weekly - 1 sets - 10 reps - Alternating Punch with Resistance  - 1 x daily - 5 x weekly - 1 sets - 10 reps - 5 seconds hold - Standing Scapular Protraction with Resistance  - 1 x daily - 7 x weekly - 1 sets - 10 reps - 5 seconds hold - Standing alternate rows with resistance  - 1 x daily - 7 x weekly - 1 sets - 10 reps - 5 seconds hold - Standing Row with Anchored Resistance  - 1 x daily - 7 x weekly - 1 sets - 10 reps - 5 seconds hold - Alternating elbow flexion with resistance  - 1 x daily - 7 x weekly - 1 sets - 10 reps - 5 seconds hold - Standing Bicep Curls with Resistance  - 1 x daily - 7 x weekly - 1 sets - 10 reps - 5 seconds hold   Do each exercise 1-2  times per day Do each exercise 5-10 repetitions Hold each exercise for 2 seconds to feel your location  AT [place].  Try to find this position when standing still for activities.  USE TAPE ON FLOOR TO MARK THE MIDLINE POSITION which is even with middle of sink.  You also should try to feel with your limb pressure in socket.  You are trying to feel with limb what you used to feel with the bottom of your foot.  Side to Side Shift: Moving your hips only (not shoulders): move weight onto your left leg, HOLD/FEEL pressure in socket.  Move back to equal weight on each leg, HOLD/FEEL pressure in socket. Move weight onto your right leg,  HOLD/FEEL pressure in socket. Move back to equal weight on each leg, HOLD/FEEL pressure in socket. Repeat.  Start with both hands on sink, progress to hand on prosthetic side only, then no hands.  Front to Back Shift: Moving your hips only (not shoulders): move your weight forward onto your toes, HOLD/FEEL pressure in socket. Move your weight back to equal Flat Foot on both legs, HOLD/FEEL  pressure in socket. Move your weight back onto your heels, HOLD/FEEL  pressure in socket. Move your weight back to equal on both legs, HOLD/FEEL  pressure in socket. Repeat.  Start with both hands on sink, progress to hand on prosthetic side only, then no hands.  Moving Cones / Cups: With equal weight on each leg: Hold on with one hand the first time, then progress to no hand supports. Move cups from one side of sink to the other. Place cups ~2" out of your reach, progress to 10" beyond reach.  Place one hand in middle of sink and reach with other hand. Do both arms.  Then hover one hand and move cups with other hand.  Overhead/Upward Reaching: alternated reaching up to top cabinets or ceiling if no cabinets present. Keep equal weight on each leg. Start with one hand support on counter while other hand reaches and progress to no hand support with reaching.  ace one hand in middle of sink and reach with other hand. Do both arms.  Then hover one hand and move cups with other hand.  5.   Looking Over Shoulders: With equal weight on each leg: alternate turning to look over your shoulders with one hand support on counter as needed.  Start with head motions only to look in front of shoulder, then even with shoulder and progress to looking behind you. To look to side, move head /eyes, then shoulder on side looking pulls back, shift more weight to side looking and pull hip back. Place one hand in middle of sink and let go with other hand so your shoulder can pull back. Switch hands to look other way.   Then hover one hand and look  over shoulder. If looking right, use left hand at sink. If looking left, use right hand at sink. 6.  Stepping with leg that is not amputated:  Move items under cabinet out of your way. Shift your hips/pelvis so weight on prosthesis. Tighten muscles in hip on prosthetic side.  SLOWLY step other leg so front of foot is in cabinet. Then step back to floor.      ASSESSMENT:  CLINICAL IMPRESSION: Patient has low back pain that he he feels is related to sleeping.  PT instructed in stretches and positioning in bed which he reported understanding.  Pt continues to benefit from skilled PT.  OBJECTIVE IMPAIRMENTS: Abnormal gait, decreased activity tolerance, decreased balance, decreased endurance, decreased knowledge of condition, decreased knowledge of use of DME, decreased mobility, decreased ROM, decreased strength, increased edema, impaired flexibility, postural dysfunction, prosthetic dependency ,  obesity, and wound on residual limb .    ACTIVITY LIMITATIONS: carrying, lifting, bending, standing, squatting, stairs, transfers, locomotion level, and prosthetic use   PARTICIPATION LIMITATIONS: meal prep, cleaning, laundry, driving, community activity, occupation, and yard work   PERSONAL FACTORS: Fitness, Time since onset of injury/illness/exacerbation, and 3+ comorbidities: see PMH  are also affecting patient's functional outcome.    REHAB POTENTIAL: Good   CLINICAL DECISION MAKING: Evolving/moderate complexity   EVALUATION COMPLEXITY: Moderate     GOALS: Goals reviewed with patient? Yes   SHORT TERM GOALS: Target date: 06/20/2022   Patient verbalizes how to adjust ply socks. Baseline: SEE OBJECTIVE DATA Goal status: MET 06/24/2022 2.  Patient tolerates prosthesis >90% awake hrs /day without skin issues or limb pain after standing. Baseline: SEE OBJECTIVE DATA Goal status:  MET 06/17/2022   3. Berg Balance >45/56 Baseline: SEE OBJECTIVE DATA Goal status:  MET 06/24/2022   4. Patient  ambulates 400' with prosthesis with supervision. Baseline: SEE OBJECTIVE DATA Goal status:  MET 06/24/2022   5. Patient negotiates ramps & curbs with prosthesis with supervision Baseline: SEE OBJECTIVE DATA Goal status:  MET 06/24/2022   LONG TERM GOALS: Target date: 07/31/2021   Patient demonstrates & verbalized understanding of prosthetic care to enable safe utilization of prosthesis. Baseline: SEE OBJECTIVE DATA Goal status: Ongoing 05/29/2022   Patient tolerates prosthesis wear >90% of awake hours without skin or limb pain issues. Baseline: SEE OBJECTIVE DATA Goal status: Ongoing 05/29/2022   Functional Gait Assessment >22/30 to indicate lower fall risk Baseline: SEE OBJECTIVE DATA Goal status: Ongoing 05/29/2022   Patient ambulates >500' with prosthesis only independently Baseline: SEE OBJECTIVE DATA Goal status: Ongoing 05/29/2022   Patient negotiates ramps, curbs & stairs with single rail with prosthesis only independently. Baseline: SEE OBJECTIVE DATA Goal status: Ongoing 05/29/2022   Patient demonstrates & verbalizes lifting, carrying, pushing, pulling with prosthesis only safely. Baseline: SEE OBJECTIVE DATA Goal status:  Ongoing 05/29/2022     PLAN:   PT FREQUENCY: 3x/week for 5 weeks, then 1-2 x/wk for 10 weeks   PT DURATION: other: 15 weeks   PLANNED INTERVENTIONS: Therapeutic exercises, Therapeutic activity, Neuromuscular re-education, Balance training, Gait training, Patient/Family education, Self Care, Stair training, Vestibular training, Prosthetic training, DME instructions, and physical performance testing   PLAN FOR NEXT SESSION: Check on back pain with stretches and positioning in bed.  Continue work simulation,   continue push/pull with pulley weights  Jamey Reas, PT, DPT 07/08/2022, 5:01 PM

## 2022-07-10 ENCOUNTER — Ambulatory Visit
Admission: RE | Admit: 2022-07-10 | Discharge: 2022-07-10 | Disposition: A | Discharge status: Home / Self Care | Payer: Self-pay | Source: Ambulatory Visit | Attending: Nurse Practitioner | Admitting: Nurse Practitioner

## 2022-07-10 ENCOUNTER — Encounter (HOSPITAL_COMMUNITY): Payer: Self-pay

## 2022-07-10 ENCOUNTER — Ambulatory Visit
Admission: RE | Admit: 2022-07-10 | Discharge: 2022-07-10 | Disposition: A | Discharge status: Home / Self Care | Payer: 59 | Source: Ambulatory Visit | Attending: Nurse Practitioner | Admitting: Nurse Practitioner

## 2022-07-10 ENCOUNTER — Ambulatory Visit (HOSPITAL_COMMUNITY)
Admission: RE | Admit: 2022-07-10 | Discharge: 2022-07-10 | Disposition: A | Discharge status: Home / Self Care | Payer: 59 | Source: Ambulatory Visit | Attending: Nurse Practitioner | Admitting: Nurse Practitioner

## 2022-07-10 DIAGNOSIS — E119 Type 2 diabetes mellitus without complications: Secondary | ICD-10-CM | POA: Diagnosis not present

## 2022-07-10 DIAGNOSIS — R0600 Dyspnea, unspecified: Secondary | ICD-10-CM | POA: Diagnosis not present

## 2022-07-10 DIAGNOSIS — R0601 Orthopnea: Secondary | ICD-10-CM

## 2022-07-10 DIAGNOSIS — I10 Essential (primary) hypertension: Secondary | ICD-10-CM | POA: Diagnosis not present

## 2022-07-10 DIAGNOSIS — R140 Abdominal distension (gaseous): Secondary | ICD-10-CM

## 2022-07-10 LAB — ECHOCARDIOGRAM COMPLETE
Area-P 1/2: 3.91 cm2
Calc EF: 56.6 %
S' Lateral: 3.5 cm
Single Plane A2C EF: 55.3 %
Single Plane A4C EF: 56.8 %

## 2022-07-10 NOTE — Progress Notes (Signed)
Echocardiogram 2D Echocardiogram has been performed.  Fidel Levy 07/10/2022, 8:50 AM

## 2022-07-15 ENCOUNTER — Ambulatory Visit (INDEPENDENT_AMBULATORY_CARE_PROVIDER_SITE_OTHER): Payer: 59 | Admitting: Physical Therapy

## 2022-07-15 ENCOUNTER — Encounter: Payer: Self-pay | Admitting: Physical Therapy

## 2022-07-15 ENCOUNTER — Other Ambulatory Visit: Payer: Self-pay | Admitting: Nurse Practitioner

## 2022-07-15 DIAGNOSIS — R2689 Other abnormalities of gait and mobility: Secondary | ICD-10-CM | POA: Diagnosis not present

## 2022-07-15 DIAGNOSIS — M6281 Muscle weakness (generalized): Secondary | ICD-10-CM

## 2022-07-15 DIAGNOSIS — R2681 Unsteadiness on feet: Secondary | ICD-10-CM

## 2022-07-15 NOTE — Therapy (Signed)
OUTPATIENT PHYSICAL THERAPY TREATMENT NOTE   Patient Name: Raymond [name] MRN: [id number] DOB:Jul 25, 1960, 62 y.o., male Today's Date: 07/15/2022  PCP: [name], FNP  REFERRING PROVIDER: Newt Minion, MD      END OF SESSION:   PT End of Session - 07/15/22 0805     Visit Number 23    Number of Visits 25    Date for PT Re-Evaluation 08/01/22    Authorization Type [employer]ET[employer]    Authorization Time Period NO COPAY, 35 PT/OT VISITS Calendar Year Benefit Date 05/26/2021 - 05/25/2022    Authorization - Number of Visits 35    Progress Note Due on Visit 58    PT Start Time 0801    PT Stop Time 0845    PT Time Calculation (min) 44 min    Equipment Utilized During Treatment Gait belt    Activity Tolerance Patient tolerated treatment well    Behavior During Therapy WFL for tasks assessed/performed                              Past Medical History:  Diagnosis Date   Diabetes ([place])    Diabetes mellitus without complication ([place])    type 2   ESRD (end stage renal disease) ([place])    03/09/2015- patient had a kidney transplant   Hypertension    Nausea vomiting and diarrhea 12/10/2015   Peripheral vascular disease ([place])    Renal disorder    Renal insufficiency    Past Surgical History:  Procedure Laterality Date   ABDOMINAL AORTOGRAM W/LOWER EXTREMITY N/A 09/11/2021   Procedure: ABDOMINAL AORTOGRAM W/LOWER EXTREMITY;  Surgeon: Wellington Hampshire, MD;  Location: [facility];  Service: Cardiovascular;  Laterality: N/A;   AMPUTATION Right 09/25/2021   Procedure: RIGHT TRANSMETATARSAL AMPUTATION AND APPLY TISSUE GRAFT;  Surgeon: Newt Minion, MD;  Location: [facility];  Service: Orthopedics;  Laterality: Right;   AMPUTATION Right 11/16/2021   Procedure: RIGHT AMPUTATION BELOW KNEE WITH WOUND VAC APPLICATION;  Surgeon: Newt Minion, MD;  Location: [facility];  Service: Orthopedics;  Laterality: Right;   BONE BIOPSY Right 03/06/2021   Procedure: BONE BIOPSY;   Surgeon: Trula Slade, DPM;  Location: [facility];  Service: Podiatry;  Laterality: Right;   CENTRAL VENOUS CATHETER INSERTION Right 01/20/2020   Procedure: INSERTION CENTRAL LINE ADULT; Tunneled central line;  Surgeon: Virl Cagey, MD;  Location: [facility];  Service: General;  Laterality: Right;   COLONOSCOPY N/A 12/05/2014   [id number] external and internal hemorrhoid/mild diverticulosis/11 polyps removed   ESOPHAGOGASTRODUODENOSCOPY N/A 12/05/2014   [id number] duodenitis   GRAFT APPLICATION Right [id number]   Procedure: GRAFT APPLICATION;  Surgeon: Trula Slade, DPM;  Location: [facility];  Service: Podiatry;  Laterality: Right;   INCISION AND DRAINAGE OF WOUND Right 08/15/2021   Procedure: IRRIGATION AND DEBRIDEMENT WOUND;  Surgeon: Landis Martins, DPM;  Location: [facility];  Service: Podiatry;  Laterality: Right;  need to make card not  a irrigation and debridement card   IR FLUORO GUIDE CV LINE RIGHT  03/01/2019   IR PERC TUN PERIT CATH WO PORT S&I /IMAG  06/27/2021   IR REMOVAL TUN CV CATH W/O FL  05/10/2019   IR REMOVAL TUN CV CATH W/O FL  02/29/2020   IR REMOVAL TUN CV CATH W/O FL  07/19/2021   IR US GUIDE VASC ACCESS RIGHT  03/01/2019   IR US GUIDE VASC ACCESS RIGHT  06/27/2021  IRRIGATION AND DEBRIDEMENT ELBOW Left 06/24/2021   Procedure: IRRIGATION AND DEBRIDEMENT ELBOW;  Surgeon: Lovell Sheehan, MD;  Location: [facility];  Service: Orthopedics;  Laterality: Left;   KIDNEY TRANSPLANT  03/27/2015   Penile Pump Insertion     PERIPHERAL VASCULAR ATHERECTOMY  09/11/2021   Procedure: PERIPHERAL VASCULAR ATHERECTOMY;  Surgeon: Wellington Hampshire, MD;  Location: [facility];  Service: Cardiovascular;;  [facility] Lithotripsy   TEE WITHOUT CARDIOVERSION N/A 01/17/2020   Procedure: TRANSESOPHAGEAL ECHOCARDIOGRAM (TEE) WITH PROPOFOL;  Surgeon: Arnoldo Lenis, MD;  [facility];  Service: Endoscopy;  Laterality: N/A;   WOUND DEBRIDEMENT Right 03/06/2021   Procedure:  DEBRIDEMENT WOUND;  Surgeon: Trula Slade, DPM;  Location[facility];  Service: Podiatry;  Laterality: Right;   Patient Active Problem List   Diagnosis Date Noted   Acquired absence of right leg below knee ([place]) 06/18/2022   Chronic kidney disease, stage 5 ([place]) 06/18/2022   Cutaneous abscess of right foot    Hyperkalemia 08/28/2021   Obesity (BMI 30-39.9) 08/28/2021   History of anemia due to chronic kidney disease 08/28/2021   Diabetic foot ulcer ([place]) 08/14/2021   Hypomagnesemia 06/25/2021   Peripheral vascular disease ([place]) 04/24/2020   Thrombophlebitis of superficial veins of right lower extremity    Difficult intravenous access    MRSA bacteremia    Diabetic foot infection ([place])    Infected wound 01/12/2020   Critical lower limb ischemia ([place]) 02/02/2019   Personal history of noncompliance with medical treatment, presenting hazards to health 10/07/2018   Critical limb ischemia with history of revascularization of same extremity ([place]) 07/27/2018   Mixed hyperlipidemia 02/19/2018   History of renal transplant 02/19/2018   Conductive hearing loss, bilateral 08/31/2017   Nuclear sclerotic cataract of right eye 10/07/2016   Pseudophakia of left eye 10/07/2016   Hypertension due to endocrine disorder 03/04/2016   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 ([place]) 03/04/2016   Renal failure 03/04/2016   Proliferative diabetic retinopathy with macular edema associated with type 2 diabetes mellitus ([place]) 03/04/2016   Immunosuppression ([place]) 12/10/2015   Nausea vomiting and diarrhea 12/10/2015   Type 2 diabetes mellitus ([place]) 12/10/2015   Essential hypertension, benign 12/10/2015   Pancytopenia ([place]) 12/10/2015   Encounter for screening colonoscopy 11/15/2014   GERD (gastroesophageal reflux disease) 11/15/2014    REFERRING DIAG: [id number] (ICD-10-CM) - Below-knee amputation of right lower extremit   ONSET DATE:  04/10/2022 prosthesis delivery   THERAPY DIAG:  Unsteadiness on feet  Other abnormalities of gait and mobility  Muscle weakness (generalized)  Rationale for Evaluation and Treatment Rehabilitation  PERTINENT HISTORY: DM2, PVD, ESRD s/p renal transplant 03/09/2015 on immunosuppressive meds, HTN   PRECAUTIONS: None  SUBJECTIVE:  SUBJECTIVE STATEMENT:  He tried things in bed PT recommended and it seems to help. No back pain this morning.  His neck is sore this morning.    PAIN:  Are you having pain? Residual limb pain  0/10 soreness from wearing prosthesis without ability to seat limb into socket.   OBJECTIVE: (objective measures completed at initial evaluation unless otherwise dated) POSTURE: rounded shoulders, forward head, flexed trunk , and weight shift left   LOWER EXTREMITY ROM:   ROM P:passive  A:active Right eval Left eval  Hip flexion      Hip extension      Hip abduction      Hip adduction      Hip internal rotation      Hip external rotation      Knee flexion 100*    Knee extension -5*    Ankle dorsiflexion      Ankle plantarflexion      Ankle inversion      Ankle eversion       (Blank rows = not tested)   LOWER EXTREMITY MMT:   MMT Right eval Left eval  Hip flexion 3+/5    Hip extension 3-/5    Hip abduction 3-/5    Hip adduction      Hip internal rotation      Hip external rotation      Knee flexion 3-/5    Knee extension 3/5    Ankle dorsiflexion      Ankle plantarflexion      Ankle inversion      Ankle eversion      (Blank rows = not tested)   TRANSFERS: Sit to stand: 18" chair with armrests to RW with supervision.  Stand to sit: RW to 18" chair with armrests with supervision   PROSTHETIC GAIT WITH BKA PROSTHESIS: 06/30/2022: pt amb 400' with prosthesis only with  lateral trunk lean towards prosthesis in stance.  No loss of balance noted.  Gait Velocity: 2.01 ft/sec   04/16/2023: Gait pattern: step to pattern, decreased arm swing- Right, decreased step length- Right, decreased stance time- Left, decreased stride length, decreased hip/knee flexion- Right, circumduction- Right, Right hip hike, knee flexed in stance- Right, antalgic, lateral hip instability, trunk flexed, and abducted- Right Distance walked: 29' with RW & 20' with cane Assistive device utilized: Single point cane, Walker - 2 wheeled, and BKA prosthesis Level of assistance: SBA with RW and Min A Gait velocity: 0.89 ft/sec Comments: excessive weight bearing on RW, partial weight on prosthesis   FUNCTIONAL TESTs:  06/24/2022: Merrilee Jansky Balance 46/56  05/12/2022:  Merrilee Jansky Balance test 42/56 TUG with prosthesis only: std 9.96sec & cog 10.94sec  04/15/2022: Timed up and go (TUG): 39.25sec with cane & BKA prosthesis Berg Balance Scale: 24/56     CURRENT PROSTHETIC WEAR ASSESSMENT: Patient is dependent with: skin check, residual limb care, care of non-amputated limb, prosthetic cleaning, ply sock cleaning, correct ply sock adjustment, proper wear schedule/adjustment, and proper weight-bearing schedule/adjustment Donning prosthesis: Min A Doffing prosthesis: SBA Prosthetic wear tolerance: 3 hours, 1-2x/day, 5 of 5 days since delivery Prosthetic weight bearing tolerance: 5 minutes Edema: pitting. Residual limb condition: 22m scab wound on medial incision, dry skin, normal color & temperature, cylindrical shape Prosthetic description: silicon liner with pin lock suspension, total contact socket, hydraulic ankle / K3 foot K code/activity level with prosthetic use: Level 3       TODAY'S TREATMENT:  DATE:  07/15/2022: Therapeutic Activities: PT verbally cued on height of  pillow so neck not side bending when laying on his side. Pt & wife verbalized understanding.  Lifting box & carrying 100' with BUEs. 25# & 35# 1 rep ea.  Verbal cues  on prosthetic foot position for lift & lowering to floor. PT instructed in squat at sink with prosthetic foot offset forward so stays flat with hip width to wide stance 3 positions 3 reps ea position 2 sets.  Pt verbalizes as HEP.  PT demo & verbal cues on moving 25# & 35# box 90* & push onto cart / truck bed then pulling off cart, turning 90* & lowering to floor.  Pt return demo 3 reps ea 90* right & left with correctional cues.Push/pull walking forward/back 40# cable wt (20# per stack) 5 reps wt anterior & 5 reps wt posterior. 25# side stepping 10 reps ea with weight stack to right & left Push & pull in straddle step with wt shift 40# (20# per UE) wt posterior with forward reach 10 reps & anterior with row 10 reps.  Pulley wt 5# hip add, flex SLR, flex bending knee, abd, ext 10 reps ea LE with single UE support. Level 2 of 3 with foot not touching on outward motion.  Cues on upright trunk / not leaning.   07/08/2022: Therapeutic Exercise: PT instructed pt in supine back stretches 15 sec hold 2 reps ea - single knee to chest, double knee to chest, trunk rotation & hip rotation windshield wiper. Pt verbalized & return demo understanding.   Therapeutic Activities: PT demo and verbal cues on proper technique for supine to and from sit.  Patient to return demonstration. PT demo and verbal cues on using pillows for positioning in side-lying and tenting sheets off foot.  Patient verbalized understanding and reports less back strain. PT demo and verbal cues on using running board to enter and exit truck.  Patient able to return demonstration and reports easier.  07/01/2022: Therapeutic Activities: PT adjusted mat table similar to height of truck seat.  PT demo & verbal cues for getting in & out. Pt return demo understanding. Lifting box &  carrying 120' with BUEs. 20#, 30# & 40# 1 rep ea.  Verbal cues  on prosthetic foot position for lift & lowering to floor. PT demo & verbal cues on moving 40# box 90* & push onto cart / truck bed then pulling off cart, turning 90* & lowering to floor.  Pt return demo 5 reps with correctional cues. Pt verbalized better understanding at end of activity. Push/pull walking forward/back 40# cable wt (20# per stack) 5 reps wt anterior & 5 reps wt posterior. 20# side stepping 10 reps.  Push & pull in straddle step with wt shift 40# (20# per UE) wt posterior with forward reach 10 reps & anterior with row 10 reps.  Pulley wt 5# hip add, flex SLR, flex bending knee, abd, ext 10 reps ea LE with single UE support. Cues on upright trunk / not leaning.     HOME EXERCISE PROGRAM: Access Code: [facility] URL: [email or web address] Date: 05/22/2022 Prepared by: Jamey Reas  Exercises - Hip Flexor Stretch at [facility] of Bed  - 1-3 x daily - 7 x weekly - 1 sets - 3 reps - 20-30 seconds hold - Supine Quadriceps Stretch with Strap on Table  - 1-3 x daily - 7 x weekly - 1 sets - 3 reps - 20-30 seconds hold - Supine Bridge  -  1 x daily - 7 x weekly - 2-3 sets - 10 reps - 5 seconds hold - Marching Bridge  - 1 x daily - 7 x weekly - 2-3 sets - 10 reps - 5 seconds hold - Standing Terminal Knee Extension with Resistance  - 1 x daily - 7 x weekly - 2-3 sets - 10 reps - 5 seconds hold - Standing Hip Flexion with Resistance (Mirrored)  - 1 x daily - 7 x weekly - 1 sets - 10 reps - Standing Hip Adduction with Resistance (Mirrored)  - 1 x daily - 7 x weekly - 1 sets - 10 reps - Standing Hip Extension with Resistance  - 1 x daily - 7 x weekly - 1 sets - 10 reps - Standing Hip Abduction with Theraband Resistance  - 1 x daily - 7 x weekly - 1 sets - 10 reps - Standing Tandem Balance with Counter Support  - 1 x daily - 7 x weekly - 1 sets - 3-4 reps - 30 seconds hold - Tandem Walking with Counter Support  - 1 x daily  - 7 x weekly - 1 sets - 6 reps - single leg stance with one foot in lower cabinet  - 1 x daily - 7 x weekly - 1 sets - 5-10 reps - 10 seconds hold - [place]oca with Counter Support  - 1 x daily - 7 x weekly - 1 sets - 10 reps - Alternating Punch with Resistance  - 1 x daily - 5 x weekly - 1 sets - 10 reps - 5 seconds hold - Standing Scapular Protraction with Resistance  - 1 x daily - 7 x weekly - 1 sets - 10 reps - 5 seconds hold - Standing alternate rows with resistance  - 1 x daily - 7 x weekly - 1 sets - 10 reps - 5 seconds hold - Standing Row with Anchored Resistance  - 1 x daily - 7 x weekly - 1 sets - 10 reps - 5 seconds hold - Alternating elbow flexion with resistance  - 1 x daily - 7 x weekly - 1 sets - 10 reps - 5 seconds hold - Standing Bicep Curls with Resistance  - 1 x daily - 7 x weekly - 1 sets - 10 reps - 5 seconds hold   Do each exercise 1-2  times per day Do each exercise 5-10 repetitions Hold each exercise for 2 seconds to feel your location  AT [place].  Try to find this position when standing still for activities.   USE TAPE ON FLOOR TO MARK THE MIDLINE POSITION which is even with middle of sink.  You also should try to feel with your limb pressure in socket.  You are trying to feel with limb what you used to feel with the bottom of your foot.  Side to Side Shift: Moving your hips only (not shoulders): move weight onto your left leg, HOLD/FEEL pressure in socket.  Move back to equal weight on each leg, HOLD/FEEL pressure in socket. Move weight onto your right leg, HOLD/FEEL pressure in socket. Move back to equal weight on each leg, HOLD/FEEL pressure in socket. Repeat.  Start with both hands on sink, progress to hand on prosthetic side only, then no hands.  Front to Back Shift: Moving your hips only (not shoulders): move your weight forward onto your toes, HOLD/FEEL pressure in socket. Move your weight back  to equal Flat Foot on both legs, HOLD/FEEL  pressure in socket. Move your weight back onto your heels, HOLD/FEEL  pressure in socket. Move your weight back to equal on both legs, HOLD/FEEL  pressure in socket. Repeat.  Start with both hands on sink, progress to hand on prosthetic side only, then no hands.  Moving Cones / Cups: With equal weight on each leg: Hold on with one hand the first time, then progress to no hand supports. Move cups from one side of sink to the other. Place cups ~2" out of your reach, progress to 10" beyond reach.  Place one hand in middle of sink and reach with other hand. Do both arms.  Then hover one hand and move cups with other hand.  Overhead/Upward Reaching: alternated reaching up to top cabinets or ceiling if no cabinets present. Keep equal weight on each leg. Start with one hand support on counter while other hand reaches and progress to no hand support with reaching.  ace one hand in middle of sink and reach with other hand. Do both arms.  Then hover one hand and move cups with other hand.  5.   Looking Over Shoulders: With equal weight on each leg: alternate turning to look over your shoulders with one hand support on counter as needed.  Start with head motions only to look in front of shoulder, then even with shoulder and progress to looking behind you. To look to side, move head /eyes, then shoulder on side looking pulls back, shift more weight to side looking and pull hip back. Place one hand in middle of sink and let go with other hand so your shoulder can pull back. Switch hands to look other way.   Then hover one hand and look over shoulder. If looking right, use left hand at sink. If looking left, use right hand at sink. 6.  Stepping with leg that is not amputated:  Move items under cabinet out of your way. Shift your hips/pelvis so weight on prosthesis. Tighten muscles in hip on prosthetic side.  SLOWLY step other leg so front of foot is in cabinet. Then step back to  floor.      ASSESSMENT:  CLINICAL IMPRESSION: Patient improved lifting technique with PT instructions.  He appears to be improving balance & functional strength.  His back is not hurting prior or after PT session.  Pt continues to benefit from skilled PT.  OBJECTIVE IMPAIRMENTS: Abnormal gait, decreased activity tolerance, decreased balance, decreased endurance, decreased knowledge of condition, decreased knowledge of use of DME, decreased mobility, decreased ROM, decreased strength, increased edema, impaired flexibility, postural dysfunction, prosthetic dependency , obesity, and wound on residual limb .    ACTIVITY LIMITATIONS: carrying, lifting, bending, standing, squatting, stairs, transfers, locomotion level, and prosthetic use   PARTICIPATION LIMITATIONS: meal prep, cleaning, laundry, driving, community activity, occupation, and yard work   PERSONAL FACTORS: Fitness, Time since onset of injury/illness/exacerbation, and 3+ comorbidities: see PMH  are also affecting patient's functional outcome.    REHAB POTENTIAL: Good   CLINICAL DECISION MAKING: Evolving/moderate complexity   EVALUATION COMPLEXITY: Moderate     GOALS: Goals reviewed with patient? Yes   SHORT TERM GOALS: Target date: 06/20/2022   Patient verbalizes how to adjust ply socks. Baseline: SEE OBJECTIVE DATA Goal status: MET 06/24/2022 2.  Patient tolerates prosthesis >90% awake hrs /day without skin issues or limb pain after standing. Baseline: SEE OBJECTIVE DATA Goal status:  MET 06/17/2022   3. Berg Balance >45/56  Baseline: SEE OBJECTIVE DATA Goal status:  MET 06/24/2022   4. Patient ambulates 400' with prosthesis with supervision. Baseline: SEE OBJECTIVE DATA Goal status:  MET 06/24/2022   5. Patient negotiates ramps & curbs with prosthesis with supervision Baseline: SEE OBJECTIVE DATA Goal status:  MET 06/24/2022   LONG TERM GOALS: Target date: 07/31/2021   Patient demonstrates & verbalized understanding of  prosthetic care to enable safe utilization of prosthesis. Baseline: SEE OBJECTIVE DATA Goal status: Ongoing 05/29/2022   Patient tolerates prosthesis wear >90% of awake hours without skin or limb pain issues. Baseline: SEE OBJECTIVE DATA Goal status: Ongoing 05/29/2022   Functional Gait Assessment >22/30 to indicate lower fall risk Baseline: SEE OBJECTIVE DATA Goal status: Ongoing 05/29/2022   Patient ambulates >500' with prosthesis only independently Baseline: SEE OBJECTIVE DATA Goal status: Ongoing 05/29/2022   Patient negotiates ramps, curbs & stairs with single rail with prosthesis only independently. Baseline: SEE OBJECTIVE DATA Goal status: Ongoing 05/29/2022   Patient demonstrates & verbalizes lifting, carrying, pushing, pulling with prosthesis only safely. Baseline: SEE OBJECTIVE DATA Goal status:  Ongoing 05/29/2022     PLAN:   PT FREQUENCY: 3x/week for 5 weeks, then 1-2 x/wk for 10 weeks   PT DURATION: other: 15 weeks   PLANNED INTERVENTIONS: Therapeutic exercises, Therapeutic activity, Neuromuscular re-education, Balance training, Gait training, Patient/Family education, Self Care, Stair training, Vestibular training, Prosthetic training, DME instructions, and physical performance testing   PLAN FOR NEXT SESSION:  Continue work simulation,   continue push/pull with pulley weights  Jamey Reas, PT, DPT 07/15/2022, 8:52 AM

## 2022-07-17 ENCOUNTER — Ambulatory Visit (INDEPENDENT_AMBULATORY_CARE_PROVIDER_SITE_OTHER): Payer: 59 | Admitting: Physical Therapy

## 2022-07-17 ENCOUNTER — Encounter: Payer: Self-pay | Admitting: Physical Therapy

## 2022-07-17 DIAGNOSIS — R2689 Other abnormalities of gait and mobility: Secondary | ICD-10-CM

## 2022-07-17 DIAGNOSIS — R2681 Unsteadiness on feet: Secondary | ICD-10-CM | POA: Diagnosis not present

## 2022-07-17 DIAGNOSIS — M6281 Muscle weakness (generalized): Secondary | ICD-10-CM | POA: Diagnosis not present

## 2022-07-17 NOTE — Therapy (Signed)
OUTPATIENT PHYSICAL THERAPY TREATMENT NOTE   Patient Name: Raymond [name] MRN: [id number] DOB:Feb 06, 1961, 62 y.o., male Today's Date: 07/17/2022  PCP: [name], FNP  REFERRING PROVIDER: Newt Minion, MD      END OF SESSION:   PT End of Session - 07/17/22 0804     Visit Number 24    Number of Visits 25    Date for PT Re-Evaluation 08/01/22    Authorization Type [employer]ET[employer]    Authorization Time Period NO COPAY, 35 PT/OT VISITS Calendar Year Benefit Date 05/26/2021 - 05/25/2022    Authorization - Number of Visits 35    Progress Note Due on Visit 57    PT Start Time 0802    PT Stop Time 0846    PT Time Calculation (min) 44 min    Equipment Utilized During Treatment Gait belt    Activity Tolerance Patient tolerated treatment well    Behavior During Therapy WFL for tasks assessed/performed                              Past Medical History:  Diagnosis Date   Diabetes ([place])    Diabetes mellitus without complication ([place])    type 2   ESRD (end stage renal disease) ([place])    03/09/2015- patient had a kidney transplant   Hypertension    Nausea vomiting and diarrhea 12/10/2015   Peripheral vascular disease ([place])    Renal disorder    Renal insufficiency    Past Surgical History:  Procedure Laterality Date   ABDOMINAL AORTOGRAM W/LOWER EXTREMITY N/A 09/11/2021   Procedure: ABDOMINAL AORTOGRAM W/LOWER EXTREMITY;  Surgeon: Wellington Hampshire, MD;  Location: [facility];  Service: Cardiovascular;  Laterality: N/A;   AMPUTATION Right 09/25/2021   Procedure: RIGHT TRANSMETATARSAL AMPUTATION AND APPLY TISSUE GRAFT;  Surgeon: Newt Minion, MD;  Location: [facility];  Service: Orthopedics;  Laterality: Right;   AMPUTATION Right 11/16/2021   Procedure: RIGHT AMPUTATION BELOW KNEE WITH WOUND VAC APPLICATION;  Surgeon: Newt Minion, MD;  Location: [facility];  Service: Orthopedics;  Laterality: Right;   BONE BIOPSY Right 03/06/2021   Procedure: BONE BIOPSY;   Surgeon: Trula Slade, DPM;  Location: [facility];  Service: Podiatry;  Laterality: Right;   CENTRAL VENOUS CATHETER INSERTION Right 01/20/2020   Procedure: INSERTION CENTRAL LINE ADULT; Tunneled central line;  Surgeon: Virl Cagey, MD;  Location: [facility];  Service: General;  Laterality: Right;   COLONOSCOPY N/A 12/05/2014   [id number] external and internal hemorrhoid/mild diverticulosis/11 polyps removed   ESOPHAGOGASTRODUODENOSCOPY N/A 12/05/2014   [id number] duodenitis   GRAFT APPLICATION Right [id number]   Procedure: GRAFT APPLICATION;  Surgeon: Trula Slade, DPM;  Location: [facility];  Service: Podiatry;  Laterality: Right;   INCISION AND DRAINAGE OF WOUND Right 08/15/2021   Procedure: IRRIGATION AND DEBRIDEMENT WOUND;  Surgeon: Landis Martins, DPM;  Location: [facility];  Service: Podiatry;  Laterality: Right;  need to make card not  a irrigation and debridement card   IR FLUORO GUIDE CV LINE RIGHT  03/01/2019   IR PERC TUN PERIT CATH WO PORT S&I /IMAG  06/27/2021   IR REMOVAL TUN CV CATH W/O FL  05/10/2019   IR REMOVAL TUN CV CATH W/O FL  02/29/2020   IR REMOVAL TUN CV CATH W/O FL  07/19/2021   IR US GUIDE VASC ACCESS RIGHT  03/01/2019   IR US GUIDE VASC ACCESS RIGHT  06/27/2021  IRRIGATION AND DEBRIDEMENT ELBOW Left 06/24/2021   Procedure: IRRIGATION AND DEBRIDEMENT ELBOW;  Surgeon: Lovell Sheehan, MD;  Location: [facility];  Service: Orthopedics;  Laterality: Left;   KIDNEY TRANSPLANT  03/27/2015   Penile Pump Insertion     PERIPHERAL VASCULAR ATHERECTOMY  09/11/2021   Procedure: PERIPHERAL VASCULAR ATHERECTOMY;  Surgeon: Wellington Hampshire, MD;  Location: [facility];  Service: Cardiovascular;;  Sh[facility]wave Lithotripsy   TEE WITHOUT CARDIOVERSION N/A 01/17/2020   Procedure: TRANSESOPHAGEAL ECHOCARDIOGRAM (TEE) WITH PROPOFOL;  Surgeon: Arnoldo Lenis, MD;  [facility];  Service: Endoscopy;  Laterality: N/A;   WOUND DEBRIDEMENT Right 03/06/2021   Procedure:  DEBRIDEMENT WOUND;  Surgeon: Trula Slade, DPM;  [facility];  Service: Podiatry;  Laterality: Right;   Patient Active Problem List   Diagnosis Date Noted   Acquired absence of right leg below knee ([place]) 06/18/2022   Chronic kidney disease, stage 5 ([place]) 06/18/2022   Cutaneous abscess of right foot    Hyperkalemia 08/28/2021   Obesity (BMI 30-39.9) 08/28/2021   History of anemia due to chronic kidney disease 08/28/2021   Diabetic foot ulcer ([place]) 08/14/2021   Hypomagnesemia 06/25/2021   Peripheral vascular disease ([place]) 04/24/2020   Thrombophlebitis of superficial veins of right lower extremity    Difficult intravenous access    MRSA bacteremia    Diabetic foot infection ([place])    Infected wound 01/12/2020   Critical lower limb ischemia ([place]) 02/02/2019   Personal history of noncompliance with medical treatment, presenting hazards to health 10/07/2018   Critical limb ischemia with history of revascularization of same extremity ([place]) 07/27/2018   Mixed hyperlipidemia 02/19/2018   History of renal transplant 02/19/2018   Conductive hearing loss, bilateral 08/31/2017   Nuclear sclerotic cataract of right eye 10/07/2016   Pseudophakia of left eye 10/07/2016   Hypertension due to endocrine disorder 03/04/2016   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 ([place]) 03/04/2016   Renal failure 03/04/2016   Proliferative diabetic retinopathy with macular edema associated with type 2 diabetes mellitus ([place]) 03/04/2016   Immunosuppression ([place]) 12/10/2015   Nausea vomiting and diarrhea 12/10/2015   Type 2 diabetes mellitus ([place]) 12/10/2015   Essential hypertension, benign 12/10/2015   Pancytopenia ([place]) 12/10/2015   Encounter for screening colonoscopy 11/15/2014   GERD (gastroesophageal reflux disease) 11/15/2014    REFERRING DIAG: [id number] (ICD-10-CM) - Below-knee amputation of right lower extremit   ONSET DATE:  04/10/2022 prosthesis delivery   THERAPY DIAG:  Unsteadiness on feet  Other abnormalities of gait and mobility  Muscle weakness (generalized)  Rationale for Evaluation and Treatment Rehabilitation  PERTINENT HISTORY: DM2, PVD, ESRD s/p renal transplant 03/09/2015 on immunosuppressive meds, HTN   PRECAUTIONS: None  SUBJECTIVE:  SUBJECTIVE STATEMENT: 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     PAIN:  Are you having pain? Residual limb pain  0/10 soreness from wearing prosthesis without ability to seat limb into socket.   OBJECTIVE: (objective measures completed at initial evaluation unless otherwise dated) POSTURE: rounded shoulders, forward head, flexed trunk , and weight shift left   LOWER EXTREMITY ROM:   ROM P:passive  A:active Right eval Left eval  Hip flexion      Hip extension      Hip abduction      Hip adduction      Hip internal rotation      Hip external rotation      Knee flexion 100*    Knee extension -5*    Ankle dorsiflexion      Ankle plantarflexion      Ankle inversion      Ankle eversion       (Blank rows = not tested)   LOWER EXTREMITY MMT:   MMT Right eval Left eval  Hip flexion 3+/5    Hip extension 3-/5    Hip abduction 3-/5    Hip adduction      Hip internal rotation      Hip external rotation      Knee flexion 3-/5    Knee extension 3/5    Ankle dorsiflexion      Ankle plantarflexion      Ankle inversion      Ankle eversion      (Blank rows = not tested)   TRANSFERS: Sit to stand: 18" chair with armrests to RW with supervision.  Stand to sit: RW to 18" chair with armrests with supervision   PROSTHETIC GAIT WITH BKA PROSTHESIS: 06/30/2022: pt amb 400' with prosthesis only with lateral  trunk lean towards prosthesis in stance.  No loss of balance noted.  Gait Velocity: 2.01 ft/sec   04/16/2023: Gait pattern: step to pattern, decreased arm swing- Right, decreased step length- Right, decreased stance time- Left, decreased stride length, decreased hip/knee flexion- Right, circumduction- Right, Right hip hike, knee flexed in stance- Right, antalgic, lateral hip instability, trunk flexed, and abducted- Right Distance walked: 16' with RW & 20' with cane Assistive device utilized: Single point cane, Walker - 2 wheeled, and BKA prosthesis Level of assistance: SBA with RW and Min A Gait velocity: 0.89 ft/sec Comments: excessive weight bearing on RW, partial weight on prosthesis   FUNCTIONAL TESTs:  06/24/2022: Merrilee Jansky Balance 46/56  05/12/2022:  Merrilee Jansky Balance test 42/56 TUG with prosthesis only: std 9.96sec & cog 10.94sec  04/15/2022: Timed up and go (TUG): 39.25sec with cane & BKA prosthesis Berg Balance Scale: 24/56     CURRENT PROSTHETIC WEAR ASSESSMENT: Patient is dependent with: skin check, residual limb care, care of non-amputated limb, prosthetic cleaning, ply sock cleaning, correct ply sock adjustment, proper wear schedule/adjustment, and proper weight-bearing schedule/adjustment Donning prosthesis: Min A Doffing prosthesis: SBA Prosthetic wear tolerance: 3 hours, 1-2x/day, 5 of 5 days since delivery Prosthetic weight bearing tolerance: 5 minutes Edema: pitting. Residual limb condition: 30m scab wound on medial incision, dry skin, normal color & temperature, cylindrical shape Prosthetic description: silicon liner with pin lock suspension, total contact socket, hydraulic ankle / K3 foot K code/activity level with prosthetic use: Level 3       TODAY'S TREATMENT:  DATE:  07/17/2022: Prosthetic Training with Transtibial Prosthesis: PT demo &  verbal on stepping over obstacles forward facing & side stepping.  Pt stepped over 6" hurdles X 6 forward facing 4 reps and side stepping 2 reps right & left. MinA for balance initially & progressed to CGA. PT instructed in set up for home for safety pt verbalized understanding.   Neuromuscular Re-ed for balance: Four-square stepping (1" PVC pipes) forward facing & back to cross junction to simulate work issue he reported. [place] facing both ways 2 reps without wt, 1 rep 10# kettle bell RUE & 1 rep 10# kettle bell LUE.  PT instructed in set up for home for safety pt verbalized understanding.  Therapeutic Activities: Push/pull walking forward/back 40# cable wt (20# per stack) 5 reps wt anterior & 5 reps wt posterior. 25# side stepping 10 reps ea with weight stack to right & left Pulley wt 5# hip add, flex SLR, flex bending knee, abd, ext 10 reps ea LE with single UE support. Level 2 of 3 with foot not touching on outward motion.  Cues on upright trunk / not leaning.    07/15/2022: Therapeutic Activities: PT verbally cued on height of pillow so neck not side bending when laying on his side. Pt & wife verbalized understanding.  Lifting box & carrying 100' with BUEs. 25# & 35# 1 rep ea.  Verbal cues  on prosthetic foot position for lift & lowering to floor. PT instructed in squat at sink with prosthetic foot offset forward so stays flat with hip width to wide stance 3 positions 3 reps ea position 2 sets.  Pt verbalizes as HEP.  PT demo & verbal cues on moving 25# & 35# box 90* & push onto cart / truck bed then pulling off cart, turning 90* & lowering to floor.  Pt return demo 3 reps ea 90* right & left with correctional cues. Push/pull walking forward/back 40# cable wt (20# per stack) 5 reps wt anterior & 5 reps wt posterior. 25# side stepping 10 reps ea with weight stack to right & left Push & pull in straddle step with wt shift 40# (20# per UE) wt posterior with forward reach 10 reps & anterior  with row 10 reps.  Pulley wt 5# hip add, flex SLR, flex bending knee, abd, ext 10 reps ea LE with single UE support. Level 2 of 3 with foot not touching on outward motion.  Cues on upright trunk / not leaning.   07/08/2022: Therapeutic Exercise: PT instructed pt in supine back stretches 15 sec hold 2 reps ea - single knee to chest, double knee to chest, trunk rotation & hip rotation windshield wiper. Pt verbalized & return demo understanding.   Therapeutic Activities: PT demo and verbal cues on proper technique for supine to and from sit.  Patient to return demonstration. PT demo and verbal cues on using pillows for positioning in side-lying and tenting sheets off foot.  Patient verbalized understanding and reports less back strain. PT demo and verbal cues on using running board to enter and exit truck.  Patient able to return demonstration and reports easier.    HOME EXERCISE PROGRAM: Access Code: [facility] URL: [email or web address] Date: 05/22/2022 Prepared by: Jamey Reas  Exercises - Hip Flexor Stretch at [facility] of [facility]  - 1-3 x daily - 7 x weekly - 1 sets - 3 reps - 20-30 seconds hold - Supine Quadriceps Stretch with Strap on Table  - 1-3 x daily - 7 x  weekly - 1 sets - 3 reps - 20-30 seconds hold - Supine Bridge  - 1 x daily - 7 x weekly - 2-3 sets - 10 reps - 5 seconds hold - Marching Bridge  - 1 x daily - 7 x weekly - 2-3 sets - 10 reps - 5 seconds hold - Standing Terminal Knee Extension with Resistance  - 1 x daily - 7 x weekly - 2-3 sets - 10 reps - 5 seconds hold - Standing Hip Flexion with Resistance (Mirrored)  - 1 x daily - 7 x weekly - 1 sets - 10 reps - Standing Hip Adduction with Resistance (Mirrored)  - 1 x daily - 7 x weekly - 1 sets - 10 reps - Standing Hip Extension with Resistance  - 1 x daily - 7 x weekly - 1 sets - 10 reps - Standing Hip Abduction with Theraband Resistance  - 1 x daily - 7 x weekly - 1 sets - 10 reps - Standing Tandem Balance with  Counter Support  - 1 x daily - 7 x weekly - 1 sets - 3-4 reps - 30 seconds hold - Tandem Walking with Counter Support  - 1 x daily - 7 x weekly - 1 sets - 6 reps - single leg stance with one foot in lower cabinet  - 1 x daily - 7 x weekly - 1 sets - 5-10 reps - 10 seconds hold - Carioca with Counter Support  - 1 x daily - 7 x weekly - 1 sets - 10 reps - Alternating Punch with Resistance  - 1 x daily - 5 x weekly - 1 sets - 10 reps - 5 seconds hold - Standing Scapular Protraction with Resistance  - 1 x daily - 7 x weekly - 1 sets - 10 reps - 5 seconds hold - Standing alternate rows with resistance  - 1 x daily - 7 x weekly - 1 sets - 10 reps - 5 seconds hold - Standing Row with Anchored Resistance  - 1 x daily - 7 x weekly - 1 sets - 10 reps - 5 seconds hold - Alternating elbow flexion with resistance  - 1 x daily - 7 x weekly - 1 sets - 10 reps - 5 seconds hold - Standing Bicep Curls with Resistance  - 1 x daily - 7 x weekly - 1 sets - 10 reps - 5 seconds hold   Do each exercise 1-2  times per day Do each exercise 5-10 repetitions Hold each exercise for 2 seconds to feel your location  AT [place].  Try to find this position when standing still for activities.   USE TAPE ON FLOOR TO MARK THE MIDLINE POSITION which is even with middle of sink.  You also should try to feel with your limb pressure in socket.  You are trying to feel with limb what you used to feel with the bottom of your foot.  Side to Side Shift: Moving your hips only (not shoulders): move weight onto your left leg, HOLD/FEEL pressure in socket.  Move back to equal weight on each leg, HOLD/FEEL pressure in socket. Move weight onto your right leg, HOLD/FEEL pressure in socket. Move back to equal weight on each leg, HOLD/FEEL pressure in socket. Repeat.  Start with both hands on sink, progress to hand on prosthetic side only, then no hands.  Front to Back Shift:  Moving your hips only (not  shoulders): move your weight forward onto your toes, HOLD/FEEL pressure in socket. Move your weight back to equal Flat Foot on both legs, HOLD/FEEL  pressure in socket. Move your weight back onto your heels, HOLD/FEEL  pressure in socket. Move your weight back to equal on both legs, HOLD/FEEL  pressure in socket. Repeat.  Start with both hands on sink, progress to hand on prosthetic side only, then no hands.  Moving Cones / Cups: With equal weight on each leg: Hold on with one hand the first time, then progress to no hand supports. Move cups from one side of sink to the other. Place cups ~2" out of your reach, progress to 10" beyond reach.  Place one hand in middle of sink and reach with other hand. Do both arms.  Then hover one hand and move cups with other hand.  Overhead/Upward Reaching: alternated reaching up to top cabinets or ceiling if no cabinets present. Keep equal weight on each leg. Start with one hand support on counter while other hand reaches and progress to no hand support with reaching.  ace one hand in middle of sink and reach with other hand. Do both arms.  Then hover one hand and move cups with other hand.  5.   Looking Over Shoulders: With equal weight on each leg: alternate turning to look over your shoulders with one hand support on counter as needed.  Start with head motions only to look in front of shoulder, then even with shoulder and progress to looking behind you. To look to side, move head /eyes, then shoulder on side looking pulls back, shift more weight to side looking and pull hip back. Place one hand in middle of sink and let go with other hand so your shoulder can pull back. Switch hands to look other way.   Then hover one hand and look over shoulder. If looking right, use left hand at sink. If looking left, use right hand at sink. 6.  Stepping with leg that is not amputated:  Move items under cabinet out of your way. Shift your hips/pelvis so weight on  prosthesis. Tighten muscles in hip on prosthetic side.  SLOWLY step other leg so front of foot is in cabinet. Then step back to floor.      ASSESSMENT:  CLINICAL IMPRESSION: PT added stepping over obstacles & 4-square step activities with report of balance issues with work.  He appears to understand how to do as HEP including set up for safety.  He improved both activities with instruction & repetition.   Pt continues to benefit from skilled PT.  OBJECTIVE IMPAIRMENTS: Abnormal gait, decreased activity tolerance, decreased balance, decreased endurance, decreased knowledge of condition, decreased knowledge of use of DME, decreased mobility, decreased ROM, decreased strength, increased edema, impaired flexibility, postural dysfunction, prosthetic dependency , obesity, and wound on residual limb .    ACTIVITY LIMITATIONS: carrying, lifting, bending, standing, squatting, stairs, transfers, locomotion level, and prosthetic use   PARTICIPATION LIMITATIONS: meal prep, cleaning, laundry, driving, community activity, occupation, and yard work   PERSONAL FACTORS: Fitness, Time since onset of injury/illness/exacerbation, and 3+ comorbidities: see PMH  are also affecting patient's functional outcome.    REHAB POTENTIAL: Good   CLINICAL DECISION MAKING: Evolving/moderate complexity   EVALUATION COMPLEXITY: Moderate     GOALS: Goals reviewed with patient? Yes   SHORT TERM GOALS: Target date: 06/20/2022   Patient verbalizes how to adjust ply socks. Baseline: SEE OBJECTIVE DATA Goal status: MET 06/24/2022 2.  Patient  tolerates prosthesis >90% awake hrs /day without skin issues or limb pain after standing. Baseline: SEE OBJECTIVE DATA Goal status:  MET 06/17/2022   3. Berg Balance >45/56 Baseline: SEE OBJECTIVE DATA Goal status:  MET 06/24/2022   4. Patient ambulates 400' with prosthesis with supervision. Baseline: SEE OBJECTIVE DATA Goal status:  MET 06/24/2022   5. Patient negotiates ramps &  curbs with prosthesis with supervision Baseline: SEE OBJECTIVE DATA Goal status:  MET 06/24/2022   LONG TERM GOALS: Target date: 07/31/2021   Patient demonstrates & verbalized understanding of prosthetic care to enable safe utilization of prosthesis. Baseline: SEE OBJECTIVE DATA Goal status: Ongoing 05/29/2022   Patient tolerates prosthesis wear >90% of awake hours without skin or limb pain issues. Baseline: SEE OBJECTIVE DATA Goal status: Ongoing 05/29/2022   Functional Gait Assessment >22/30 to indicate lower fall risk Baseline: SEE OBJECTIVE DATA Goal status: Ongoing 05/29/2022   Patient ambulates >500' with prosthesis only independently Baseline: SEE OBJECTIVE DATA Goal status: Ongoing 05/29/2022   Patient negotiates ramps, curbs & stairs with single rail with prosthesis only independently. Baseline: SEE OBJECTIVE DATA Goal status: Ongoing 05/29/2022   Patient demonstrates & verbalizes lifting, carrying, pushing, pulling with prosthesis only safely. Baseline: SEE OBJECTIVE DATA Goal status:  Ongoing 05/29/2022     PLAN:   PT FREQUENCY: 3x/week for 5 weeks, then 1-2 x/wk for 10 weeks   PT DURATION: other: 15 weeks   PLANNED INTERVENTIONS: Therapeutic exercises, Therapeutic activity, Neuromuscular re-education, Balance training, Gait training, Patient/Family education, Self Care, Stair training, Vestibular training, Prosthetic training, DME instructions, and physical performance testing   PLAN FOR NEXT SESSION:  check on stepping over obstacles & 4-square,  Continue work simulation,   continue push/pull with pulley weights  Jamey Reas, PT, DPT 07/17/2022, 9:02 AM

## 2022-07-22 ENCOUNTER — Encounter: Payer: 59 | Admitting: Physical Therapy

## 2022-07-22 NOTE — Therapy (Incomplete)
OUTPATIENT PHYSICAL THERAPY TREATMENT NOTE   Patient Name: Raymond [name] MRN: [id number] DOB:04-Oct-1960, 62 y.o., male Today's Date: 07/22/2022  PCP: [name], FNP  REFERRING PROVIDER: Newt Minion, MD      END OF SESSION:                      Past Medical History:  Diagnosis Date   Diabetes [facility])    Diabetes mellitus without complication ([place])    type 2   ESRD (end stage renal disease) ([place])    03/09/2015- patient had a kidney transplant   Hypertension    Nausea vomiting and diarrhea 12/10/2015   Peripheral vascular disease ([place])    Renal disorder    Renal insufficiency    Past Surgical History:  Procedure Laterality Date   ABDOMINAL AORTOGRAM W/LOWER EXTREMITY N/A 09/11/2021   Procedure: ABDOMINAL AORTOGRAM W/LOWER EXTREMITY;  Surgeon: Wellington Hampshire, MD;  Location: [place] [facility];  Service: Cardiovascular;  Laterality: N/A;   AMPUTATION Right 09/25/2021   Procedure: RIGHT TRANSMETATARSAL AMPUTATION AND APPLY TISSUE GRAFT;  Surgeon: Newt Minion, MD;  Location: [place];  Service: Orthopedics;  Laterality: Right;   AMPUTATION Right 11/16/2021   Procedure: RIGHT AMPUTATION BELOW KNEE WITH WOUND VAC APPLICATION;  Surgeon: Newt Minion, MD;  Location: [place];  Service: Orthopedics;  Laterality: Right;   BONE BIOPSY Right 03/06/2021   Procedure: BONE BIOPSY;  Surgeon: Trula Slade, DPM;  [facility]: [facility];  Service: Podiatry;  Laterality: Right;   CENTRAL VENOUS CATHETER INSERTION Right 01/20/2020   Procedure: INSERTION CENTRAL LINE ADULT; Tunneled central line;  Surgeon: Virl Cagey, MD;  Location: [facility];  Service: General;  Laterality: Right;   COLONOSCOPY N/A 12/05/2014   [id number] external and internal hemorrhoid/mild diverticulosis/11 polyps removed   ESOPHAGOGASTRODUODENOSCOPY N/A 12/05/2014   [id number] duodenitis   GRAFT APPLICATION Right [id number]   Procedure: GRAFT APPLICATION;  Surgeon: Trula Slade, DPM;   [facility]: [facility];  Service: Podiatry;  Laterality: Right;   INCISION AND DRAINAGE OF WOUND Right 08/15/2021   Procedure: IRRIGATION AND DEBRIDEMENT WOUND;  Surgeon: Landis Martins, DPM;  Location: [facility];  Service: Podiatry;  Laterality: Right;  need to make card not  a irrigation and debridement card   IR FLUORO GUIDE CV LINE RIGHT  03/01/2019   IR PERC TUN PERIT CATH WO PORT S&I /IMAG  06/27/2021   IR REMOVAL TUN CV CATH W/O FL  05/10/2019   IR REMOVAL TUN CV CATH W/O FL  02/29/2020   IR REMOVAL TUN CV CATH W/O FL  07/19/2021   IR US GUIDE VASC ACCESS RIGHT  03/01/2019   IR US GUIDE VASC ACCESS RIGHT  06/27/2021   IRRIGATION AND DEBRIDEMENT ELBOW Left 06/24/2021   Procedure: IRRIGATION AND DEBRIDEMENT ELBOW;  Surgeon: Lovell Sheehan, MD;  Location: [facility];  Service: Orthopedics;  Laterality: Left;   KIDNEY TRANSPLANT  03/27/2015   Penile Pump Insertion     PERIPHERAL VASCULAR ATHERECTOMY  09/11/2021   Procedure: PERIPHERAL VASCULAR ATHERECTOMY;  Surgeon: Wellington Hampshire, MD;  Location: [place];  Service: Cardiovascular;;  Shockwave Lithotripsy   TEE WITHOUT CARDIOVERSION N/A 01/17/2020   Procedure: TRANSESOPHAGEAL ECHOCARDIOGRAM (TEE) WITH PROPOFOL;  Surgeon: Arnoldo Lenis, MD;  [facility];  Service: Endoscopy;  Laterality: N/A;   WOUND DEBRIDEMENT Right 03/06/2021   Procedure: DEBRIDEMENT WOUND;  Surgeon: Trula Slade, DPM;  [facility];  Service: Podiatry;  Laterality:  Right;   Patient Active Problem List   Diagnosis Date Noted   Acquired absence of right leg below knee ([place]) 06/18/2022   Chronic kidney disease, stage 5 ([place]) 06/18/2022   Cutaneous abscess of right foot    Hyperkalemia 08/28/2021   Obesity (BMI 30-39.9) 08/28/2021   History of anemia due to chronic kidney disease 08/28/2021   Diabetic foot ulcer ([place]) 08/14/2021   Hypomagnesemia 06/25/2021   Peripheral vascular disease ([place]) 04/24/2020   Thrombophlebitis of superficial veins  of right lower extremity    Difficult intravenous access    MRSA bacteremia    Diabetic foot infection ([place])    Infected wound 01/12/2020   Critical lower limb ischemia ([place]) 02/02/2019   Personal history of noncompliance with medical treatment, presenting hazards to health 10/07/2018   Critical limb ischemia with history of revascularization of same extremity ([place]) 07/27/2018   Mixed hyperlipidemia 02/19/2018   History of renal transplant 02/19/2018   Conductive hearing loss, bilateral 08/31/2017   Nuclear sclerotic cataract of right eye 10/07/2016   Pseudophakia of left eye 10/07/2016   Hypertension due to endocrine disorder 03/04/2016   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 ([place]) 03/04/2016   Renal failure 03/04/2016   Proliferative diabetic retinopathy with macular edema associated with type 2 diabetes mellitus ([place]) 03/04/2016   Immunosuppression ([place]) 12/10/2015   Nausea vomiting and diarrhea 12/10/2015   Type 2 diabetes mellitus ([place]) 12/10/2015   Essential hypertension, benign 12/10/2015   Pancytopenia ([place]) 12/10/2015   Encounter for screening colonoscopy 11/15/2014   GERD (gastroesophageal reflux disease) 11/15/2014    REFERRING DIAG: [id number] (ICD-10-CM) - Below-knee amputation of right lower extremit   ONSET DATE: 04/10/2022 prosthesis delivery   THERAPY DIAG:  No diagnosis found.  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     PAIN:  Are you  having pain? Residual limb pain  0/10 soreness from wearing prosthesis without ability to seat limb into socket.   OBJECTIVE: (objective measures completed at initial evaluation unless otherwise dated) POSTURE: rounded shoulders, forward head, flexed trunk , and weight shift left   LOWER EXTREMITY ROM:   ROM P:passive  A:active Right eval  Left eval  Hip flexion      Hip extension      Hip abduction      Hip adduction      Hip internal rotation      Hip external rotation      Knee flexion 100*    Knee extension -5*    Ankle dorsiflexion      Ankle plantarflexion      Ankle inversion      Ankle eversion       (Blank rows = not tested)   LOWER EXTREMITY MMT:   MMT Right eval Left eval  Hip flexion 3+/5    Hip extension 3-/5    Hip abduction 3-/5    Hip adduction      Hip internal rotation      Hip external rotation      Knee flexion 3-/5    Knee extension 3/5    Ankle dorsiflexion      Ankle plantarflexion      Ankle inversion      Ankle eversion      (Blank rows = not tested)   TRANSFERS: Sit to stand: 18" chair with armrests to RW with supervision.  Stand to sit: RW to 18" chair with armrests with supervision   PROSTHETIC GAIT WITH BKA PROSTHESIS: 06/30/2022: pt amb 400' with prosthesis only with lateral trunk lean towards prosthesis in stance.  No loss of balance noted.  Gait Velocity: 2.01 ft/sec   04/16/2023: Gait pattern: step to pattern, decreased arm swing- Right, decreased step length- Right, decreased stance time- Left, decreased stride length, decreased hip/knee flexion- Right, circumduction- Right, Right hip hike, knee flexed in stance- Right, antalgic, lateral hip instability, trunk flexed, and abducted- Right Distance walked: 76' with RW & 20' with cane Assistive device utilized: Single point cane, Walker - 2 wheeled, and BKA prosthesis Level of assistance: SBA with RW and Min A Gait velocity: 0.89 ft/sec Comments: excessive weight bearing on RW,  partial weight on prosthesis   FUNCTIONAL TESTs:  06/24/2022: Merrilee Jansky Balance 46/56  05/12/2022:  Merrilee Jansky Balance test 42/56 TUG with prosthesis only: std 9.96sec & cog 10.94sec  04/15/2022: Timed up and go (TUG): 39.25sec with cane & BKA prosthesis Berg Balance Scale: 24/56     CURRENT PROSTHETIC WEAR ASSESSMENT: Patient is dependent with: skin check, residual limb care, care of non-amputated limb, prosthetic cleaning, ply sock cleaning, correct ply sock adjustment, proper wear schedule/adjustment, and proper weight-bearing schedule/adjustment Donning prosthesis: Min A Doffing prosthesis: SBA Prosthetic wear tolerance: 3 hours, 1-2x/day, 5 of 5 days since delivery Prosthetic weight bearing tolerance: 5 minutes Edema: pitting. Residual limb condition: 44m scab wound on medial incision, dry skin, normal color & temperature, cylindrical shape Prosthetic description: silicon liner with pin lock suspension, total contact socket, hydraulic ankle / K3 foot K code/activity level with prosthetic use: Level 3       TODAY'S TREATMENT:  DATE:  07/22/2022: ***  07/17/2022: Prosthetic Training with Transtibial Prosthesis: PT demo & verbal on stepping over obstacles forward facing & side stepping.  Pt stepped over 6" hurdles X 6 forward facing 4 reps and side stepping 2 reps right & left. MinA for balance initially & progressed to CGA. PT instructed in set up for home for safety pt verbalized understanding.   Neuromuscular Re-ed for balance: Four-square stepping (1" PVC pipes) forward facing & back to cross junction to simulate work issue he reported. [place] facing both ways 2 reps without wt, 1 rep 10# kettle bell RUE & 1 rep 10# kettle bell LUE.  PT instructed in set up for home for safety pt verbalized understanding.  Therapeutic Activities: Push/pull walking forward/back  40# cable wt (20# per stack) 5 reps wt anterior & 5 reps wt posterior. 25# side stepping 10 reps ea with weight stack to right & left Pulley wt 5# hip add, flex SLR, flex bending knee, abd, ext 10 reps ea LE with single UE support. Level 2 of 3 with foot not touching on outward motion.  Cues on upright trunk / not leaning.    07/15/2022: Therapeutic Activities: PT verbally cued on height of pillow so neck not side bending when laying on his side. Pt & wife verbalized understanding.  Lifting box & carrying 100' with BUEs. 25# & 35# 1 rep ea.  Verbal cues  on prosthetic foot position for lift & lowering to floor. PT instructed in squat at sink with prosthetic foot offset forward so stays flat with hip width to wide stance 3 positions 3 reps ea position 2 sets.  Pt verbalizes as HEP.  PT demo & verbal cues on moving 25# & 35# box 90* & push onto cart / truck bed then pulling off cart, turning 90* & lowering to floor.  Pt return demo 3 reps ea 90* right & left with correctional cues. Push/pull walking forward/back 40# cable wt (20# per stack) 5 reps wt anterior & 5 reps wt posterior. 25# side stepping 10 reps ea with weight stack to right & left Push & pull in straddle step with wt shift 40# (20# per UE) wt posterior with forward reach 10 reps & anterior with row 10 reps.  Pulley wt 5# hip add, flex SLR, flex bending knee, abd, ext 10 reps ea LE with single UE support. Level 2 of 3 with foot not touching on outward motion.  Cues on upright trunk / not leaning.    HOME EXERCISE PROGRAM: Access Code: [facility] URL: [email or web address] Date: 05/22/2022 Prepared by: Jamey Reas  Exercises - Hip Flexor Stretch at [facility] of Bed  - 1-3 x daily - 7 x weekly - 1 sets - 3 reps - 20-30 seconds hold - Supine Quadriceps Stretch with Strap on Table  - 1-3 x daily - 7 x weekly - 1 sets - 3 reps - 20-30 seconds hold - Supine Bridge  - 1 x daily - 7 x weekly - 2-3 sets - 10 reps - 5 seconds  hold - Marching Bridge  - 1 x daily - 7 x weekly - 2-3 sets - 10 reps - 5 seconds hold - Standing Terminal Knee Extension with Resistance  - 1 x daily - 7 x weekly - 2-3 sets - 10 reps - 5 seconds hold - Standing Hip Flexion with Resistance (Mirrored)  - 1 x daily - 7 x weekly - 1 sets - 10 reps - Standing Hip Adduction with Resistance (  Mirrored)  - 1 x daily - 7 x weekly - 1 sets - 10 reps - Standing Hip Extension with Resistance  - 1 x daily - 7 x weekly - 1 sets - 10 reps - Standing Hip Abduction with Theraband Resistance  - 1 x daily - 7 x weekly - 1 sets - 10 reps - Standing Tandem Balance with Counter Support  - 1 x daily - 7 x weekly - 1 sets - 3-4 reps - 30 seconds hold - Tandem Walking with Counter Support  - 1 x daily - 7 x weekly - 1 sets - 6 reps - single leg stance with one foot in lower cabinet  - 1 x daily - 7 x weekly - 1 sets - 5-10 reps - 10 seconds hold - [place]arioca with Counter Support  - 1 x daily - 7 x weekly - 1 sets - 10 reps - Alternating Punch with Resistance  - 1 x daily - 5 x weekly - 1 sets - 10 reps - 5 seconds hold - Standing Scapular Protraction with Resistance  - 1 x daily - 7 x weekly - 1 sets - 10 reps - 5 seconds hold - Standing alternate rows with resistance  - 1 x daily - 7 x weekly - 1 sets - 10 reps - 5 seconds hold - Standing Row with Anchored Resistance  - 1 x daily - 7 x weekly - 1 sets - 10 reps - 5 seconds hold - Alternating elbow flexion with resistance  - 1 x daily - 7 x weekly - 1 sets - 10 reps - 5 seconds hold - Standing Bicep Curls with Resistance  - 1 x daily - 7 x weekly - 1 sets - 10 reps - 5 seconds hold   Do each exercise 1-2  times per day Do each exercise 5-10 repetitions Hold each exercise for 2 seconds to feel your location  AT [place].  Try to find this position when standing still for activities.   USE TAPE ON FLOOR TO MARK THE MIDLINE POSITION which is even with  middle of sink.  You also should try to feel with your limb pressure in socket.  You are trying to feel with limb what you used to feel with the bottom of your foot.  Side to Side Shift: Moving your hips only (not shoulders): move weight onto your left leg, HOLD/FEEL pressure in socket.  Move back to equal weight on each leg, HOLD/FEEL pressure in socket. Move weight onto your right leg, HOLD/FEEL pressure in socket. Move back to equal weight on each leg, HOLD/FEEL pressure in socket. Repeat.  Start with both hands on sink, progress to hand on prosthetic side only, then no hands.  Front to Back Shift: Moving your hips only (not shoulders): move your weight forward onto your toes, HOLD/FEEL pressure in socket. Move your weight back to equal Flat Foot on both legs, HOLD/FEEL  pressure in socket. Move your weight back onto your heels, HOLD/FEEL  pressure in socket. Move your weight back to equal on both legs, HOLD/FEEL  pressure in socket. Repeat.  Start with both hands on sink, progress to hand on prosthetic side only, then no hands.  Moving Cones / Cups: With equal weight on each leg: Hold on with one hand the first time, then progress to no hand supports. Move cups from one side of sink to the other. Place cups ~2"  out of your reach, progress to 10" beyond reach.  Place one hand in middle of sink and reach with other hand. Do both arms.  Then hover one hand and move cups with other hand.  Overhead/Upward Reaching: alternated reaching up to top cabinets or ceiling if no cabinets present. Keep equal weight on each leg. Start with one hand support on counter while other hand reaches and progress to no hand support with reaching.  ace one hand in middle of sink and reach with other hand. Do both arms.  Then hover one hand and move cups with other hand.  5.   Looking Over Shoulders: With equal weight on each leg: alternate turning to look over your shoulders with one hand support on counter as needed.  Start with  head motions only to look in front of shoulder, then even with shoulder and progress to looking behind you. To look to side, move head /eyes, then shoulder on side looking pulls back, shift more weight to side looking and pull hip back. Place one hand in middle of sink and let go with other hand so your shoulder can pull back. Switch hands to look other way.   Then hover one hand and look over shoulder. If looking right, use left hand at sink. If looking left, use right hand at sink. 6.  Stepping with leg that is not amputated:  Move items under cabinet out of your way. Shift your hips/pelvis so weight on prosthesis. Tighten muscles in hip on prosthetic side.  SLOWLY step other leg so front of foot is in cabinet. Then step back to floor.      ASSESSMENT:  CLINICAL IMPRESSION: *** PT added stepping over obstacles & 4-square step activities with report of balance issues with work.  He appears to understand how to do as HEP including set up for safety.  He improved both activities with instruction & repetition.   Pt continues to benefit from skilled PT.  OBJECTIVE IMPAIRMENTS: Abnormal gait, decreased activity tolerance, decreased balance, decreased endurance, decreased knowledge of condition, decreased knowledge of use of DME, decreased mobility, decreased ROM, decreased strength, increased edema, impaired flexibility, postural dysfunction, prosthetic dependency , obesity, and wound on residual limb .    ACTIVITY LIMITATIONS: carrying, lifting, bending, standing, squatting, stairs, transfers, locomotion level, and prosthetic use   PARTICIPATION LIMITATIONS: meal prep, cleaning, laundry, driving, community activity, occupation, and yard work   PERSONAL FACTORS: Fitness, Time since onset of injury/illness/exacerbation, and 3+ comorbidities: see PMH  are also affecting patient's functional outcome.    REHAB POTENTIAL: Good   CLINICAL DECISION MAKING: Evolving/moderate complexity   EVALUATION  COMPLEXITY: Moderate     GOALS: Goals reviewed with patient? Yes   SHORT TERM GOALS: Target date: 06/20/2022   Patient verbalizes how to adjust ply socks. Baseline: SEE OBJECTIVE DATA Goal status: MET 06/24/2022 2.  Patient tolerates prosthesis >90% awake hrs /day without skin issues or limb pain after standing. Baseline: SEE OBJECTIVE DATA Goal status:  MET 06/17/2022   3. Berg Balance >45/56 Baseline: SEE OBJECTIVE DATA Goal status:  MET 06/24/2022   4. Patient ambulates 400' with prosthesis with supervision. Baseline: SEE OBJECTIVE DATA Goal status:  MET 06/24/2022   5. Patient negotiates ramps & curbs with prosthesis with supervision Baseline: SEE OBJECTIVE DATA Goal status:  MET 06/24/2022   LONG TERM GOALS: Target date: 07/31/2021   Patient demonstrates & verbalized understanding of prosthetic care to enable safe utilization of prosthesis. Baseline: SEE OBJECTIVE DATA Goal status:  Ongoing 05/29/2022   Patient tolerates prosthesis wear >90% of awake hours without skin or limb pain issues. Baseline: SEE OBJECTIVE DATA Goal status: Ongoing 05/29/2022   Functional Gait Assessment >22/30 to indicate lower fall risk Baseline: SEE OBJECTIVE DATA Goal status: Ongoing 05/29/2022   Patient ambulates >500' with prosthesis only independently Baseline: SEE OBJECTIVE DATA Goal status: Ongoing 05/29/2022   Patient negotiates ramps, curbs & stairs with single rail with prosthesis only independently. Baseline: SEE OBJECTIVE DATA Goal status: Ongoing 05/29/2022   Patient demonstrates & verbalizes lifting, carrying, pushing, pulling with prosthesis only safely. Baseline: SEE OBJECTIVE DATA Goal status:  Ongoing 05/29/2022     PLAN:   PT FREQUENCY: 3x/week for 5 weeks, then 1-2 x/wk for 10 weeks   PT DURATION: other: 15 weeks   PLANNED INTERVENTIONS: Therapeutic exercises, Therapeutic activity, Neuromuscular re-education, Balance training, Gait training, Patient/Family education, Self Care,  Stair training, Vestibular training, Prosthetic training, DME instructions, and physical performance testing   PLAN FOR NEXT SESSION:  ***  check on stepping over obstacles & 4-square,  Continue work simulation,   continue push/pull with pulley weights  Jamey Reas, PT, DPT 07/22/2022, 7:38 AM

## 2022-07-23 ENCOUNTER — Telehealth: Payer: Self-pay

## 2022-07-23 NOTE — Telephone Encounter (Signed)
Patient calling to inquire about results from recent CT scan. He has not heard from anyone.  No result note found attached to CT report.   Please advise, thank you!

## 2022-07-24 ENCOUNTER — Encounter: Payer: Self-pay | Admitting: Physical Therapy

## 2022-07-24 ENCOUNTER — Ambulatory Visit (INDEPENDENT_AMBULATORY_CARE_PROVIDER_SITE_OTHER): Payer: 59 | Admitting: Physical Therapy

## 2022-07-24 DIAGNOSIS — R2681 Unsteadiness on feet: Secondary | ICD-10-CM

## 2022-07-24 DIAGNOSIS — M6281 Muscle weakness (generalized): Secondary | ICD-10-CM | POA: Diagnosis not present

## 2022-07-24 DIAGNOSIS — R2689 Other abnormalities of gait and mobility: Secondary | ICD-10-CM | POA: Diagnosis not present

## 2022-07-24 NOTE — Therapy (Signed)
OUTPATIENT PHYSICAL THERAPY TREATMENT NOTE   Patient Name: Raymond [name] MRN: [id number] DOB:[birth year], 62 y.o., male Today's Date: 07/24/2022  PCP: [name], FNP  REFERRING PROVIDER: Newt Minion, MD      END OF SESSION:   PT End of Session - 07/24/22 0758     Visit Number 25    Number of Visits 27    Date for PT Re-Evaluation 08/01/22    Authorization Type [employer]ET[employer]    Authorization Time Period NO COPAY, 35 PT/OT VISITS Calendar Year Benefit Date 05/26/2021 - 05/25/2022    Authorization - Number of Visits 35    Progress Note Due on Visit 27    PT Start Time 0758    PT Stop Time 0812    PT Time Calculation (min) 14 min    Equipment Utilized During Treatment Gait belt    Activity Tolerance Patient tolerated treatment well    Behavior During Therapy WFL for tasks assessed/performed                               Past Medical History:  Diagnosis Date   Diabetes ([place])    Diabetes mellitus without complication ([place])    type 2   ESRD (end stage renal disease) ([place])    03/09/2015- patient had a kidney transplant   Hypertension    Nausea vomiting and diarrhea 12/10/2015   Peripheral vascular disease ([place])    Renal disorder    Renal insufficiency    Past Surgical History:  Procedure Laterality Date   ABDOMINAL AORTOGRAM W/LOWER EXTREMITY N/A 09/11/2021   Procedure: ABDOMINAL AORTOGRAM W/LOWER EXTREMITY;  Surgeon: Wellington Hampshire, MD;  Location: [facility];  Service: Cardiovascular;  Laterality: N/A;   AMPUTATION Right 09/25/2021   Procedure: RIGHT TRANSMETATARSAL AMPUTATION AND APPLY TISSUE GRAFT;  Surgeon: Newt Minion, MD;  Location: [facility];  Service: Orthopedics;  Laterality: Right;   AMPUTATION Right 11/16/2021   Procedure: RIGHT AMPUTATION BELOW KNEE WITH WOUND VAC APPLICATION;  Surgeon: Newt Minion, MD;  Location: [facility];  Service: Orthopedics;  Laterality: Right;   BONE BIOPSY Right 03/06/2021   Procedure: BONE BIOPSY;   Surgeon: Trula Slade, DPM;  Location: [facility];  Service: Podiatry;  Laterality: Right;   CENTRAL VENOUS CATHETER INSERTION Right 01/20/2020   Procedure: INSERTION CENTRAL LINE ADULT; Tunneled central line;  Surgeon: Virl Cagey, MD;  Location: [facility];  Service: General;  Laterality: Right;   COLONOSCOPY N/A 12/05/2014   [id number] external and internal hemorrhoid/mild diverticulosis/11 polyps removed   ESOPHAGOGASTRODUODENOSCOPY N/A 12/05/2014   [id number] duodenitis   GRAFT APPLICATION Right [id number]   Procedure: GRAFT APPLICATION;  Surgeon: Trula Slade, DPM;  [facility]: [facility];  Service: Podiatry;  Laterality: Right;   INCISION AND DRAINAGE OF WOUND Right 08/15/2021   Procedure: IRRIGATION AND DEBRIDEMENT WOUND;  Surgeon: Landis Martins, DPM;  Location: [facility];  Service: Podiatry;  Laterality: Right;  need to make card not  a irrigation and debridement card   IR FLUORO GUIDE CV LINE RIGHT  03/01/2019   IR PERC TUN PERIT CATH WO PORT S&I /IMAG  06/27/2021   IR REMOVAL TUN CV CATH W/O FL  05/10/2019   IR REMOVAL TUN CV CATH W/O FL  02/29/2020   IR REMOVAL TUN CV CATH W/O FL  07/19/2021   IR US GUIDE VASC ACCESS RIGHT  03/01/2019   IR US GUIDE VASC ACCESS RIGHT  06/27/2021  IRRIGATION AND DEBRIDEMENT ELBOW Left 06/24/2021   Procedure: IRRIGATION AND DEBRIDEMENT ELBOW;  Surgeon: Lovell Sheehan, MD;  Location: [facility];  Service: Orthopedics;  Laterality: Left;   KIDNEY TRANSPLANT  03/27/2015   Penile Pump Insertion     PERIPHERAL VASCULAR ATHERECTOMY  09/11/2021   Procedure: PERIPHERAL VASCULAR ATHERECTOMY;  Surgeon: Wellington Hampshire, MD;  Location: [facility];  Service: Cardiovascular;;  Shockwave Lithotripsy   TEE WITHOUT CARDIOVERSION N/A 01/17/2020   Procedure: TRANSESOPHAGEAL ECHOCARDIOGRAM (TEE) WITH PROPOFOL;  Surgeon: Arnoldo Lenis, MD;  [facility];  Service: Endoscopy;  Laterality: N/A;   WOUND DEBRIDEMENT Right 03/06/2021   Procedure:  DEBRIDEMENT WOUND;  Surgeon: Trula Slade, DPM;  Location[facility];  Service: Podiatry;  Laterality: Right;   Patient Active Problem List   Diagnosis Date Noted   Acquired absence of right leg below knee ([place]) 06/18/2022   Chronic kidney disease, stage 5 ([place]) 06/18/2022   Cutaneous abscess of right foot    Hyperkalemia 08/28/2021   Obesity (BMI 30-39.9) 08/28/2021   History of anemia due to chronic kidney disease 08/28/2021   Diabetic foot ulcer ([place]) 08/14/2021   Hypomagnesemia 06/25/2021   Peripheral vascular disease ([place]) 04/24/2020   Thrombophlebitis of superficial veins of right lower extremity    Difficult intravenous access    MRSA bacteremia    Diabetic foot infection ([place])    Infected wound 01/12/2020   Critical lower limb ischemia ([place]) 02/02/2019   Personal history of noncompliance with medical treatment, presenting hazards to health 10/07/2018   Critical limb ischemia with history of revascularization of same extremity ([place]) 07/27/2018   Mixed hyperlipidemia 02/19/2018   History of renal transplant 02/19/2018   Conductive hearing loss, bilateral 08/31/2017   Nuclear sclerotic cataract of right eye 10/07/2016   Pseudophakia of left eye 10/07/2016   Hypertension due to endocrine disorder 03/04/2016   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 ([place]) 03/04/2016   Renal failure 03/04/2016   Proliferative diabetic retinopathy with macular edema associated with type 2 diabetes mellitus ([place]) 03/04/2016   Immunosuppression ([place]) 12/10/2015   Nausea vomiting and diarrhea 12/10/2015   Type 2 diabetes mellitus ([place]) 12/10/2015   Essential hypertension, benign 12/10/2015   Pancytopenia ([place]) 12/10/2015   Encounter for screening colonoscopy 11/15/2014   GERD (gastroesophageal reflux disease) 11/15/2014    REFERRING DIAG: [id number] (ICD-10-CM) - Below-knee amputation of right lower extremit   ONSET DATE:  04/10/2022 prosthesis delivery   THERAPY DIAG:  Unsteadiness on feet  Other abnormalities of gait and mobility  Muscle weakness (generalized)  Rationale for Evaluation and Treatment Rehabilitation  PERTINENT HISTORY: DM2, PVD, ESRD s/p renal transplant 03/09/2015 on immunosuppressive meds, HTN   PRECAUTIONS: None  SUBJECTIVE:  SUBJECTIVE STATEMENT: He has been ill for last 3 days congestion & cough.  He has not weighed.      PAIN:  Are you having pain? Residual limb pain  0/10 soreness from wearing prosthesis without ability to seat limb into socket.   OBJECTIVE: (objective measures completed at initial evaluation unless otherwise dated) POSTURE: rounded shoulders, forward head, flexed trunk , and weight shift left   LOWER EXTREMITY ROM:   ROM P:passive  A:active Right eval Left eval  Hip flexion      Hip extension      Hip abduction      Hip adduction      Hip internal rotation      Hip external rotation      Knee flexion 100*    Knee extension -5*    Ankle dorsiflexion      Ankle plantarflexion      Ankle inversion      Ankle eversion       (Blank rows = not tested)   LOWER EXTREMITY MMT:   MMT Right eval Left eval  Hip flexion 3+/5    Hip extension 3-/5    Hip abduction 3-/5    Hip adduction      Hip internal rotation      Hip external rotation      Knee flexion 3-/5    Knee extension 3/5    Ankle dorsiflexion      Ankle plantarflexion      Ankle inversion      Ankle eversion      (Blank rows = not tested)   TRANSFERS: Sit to stand: 18" chair with armrests to RW with supervision.  Stand to sit: RW to 18" chair with armrests with supervision   PROSTHETIC GAIT WITH BKA PROSTHESIS: 06/30/2022: pt amb 400' with prosthesis only with lateral trunk lean towards prosthesis in  stance.  No loss of balance noted.  Gait Velocity: 2.01 ft/sec   04/16/2023: Gait pattern: step to pattern, decreased arm swing- Right, decreased step length- Right, decreased stance time- Left, decreased stride length, decreased hip/knee flexion- Right, circumduction- Right, Right hip hike, knee flexed in stance- Right, antalgic, lateral hip instability, trunk flexed, and abducted- Right Distance walked: 56' with RW & 20' with cane Assistive device utilized: Single point cane, Walker - 2 wheeled, and BKA prosthesis Level of assistance: SBA with RW and Min A Gait velocity: 0.89 ft/sec Comments: excessive weight bearing on RW, partial weight on prosthesis   FUNCTIONAL TESTs:  06/24/2022: Merrilee Jansky Balance 46/56  05/12/2022:  Merrilee Jansky Balance test 42/56 TUG with prosthesis only: std 9.96sec & cog 10.94sec  04/15/2022: Timed up and go (TUG): 39.25sec with cane & BKA prosthesis Berg Balance Scale: 24/56     CURRENT PROSTHETIC WEAR ASSESSMENT: Patient is dependent with: skin check, residual limb care, care of non-amputated limb, prosthetic cleaning, ply sock cleaning, correct ply sock adjustment, proper wear schedule/adjustment, and proper weight-bearing schedule/adjustment Donning prosthesis: Min A Doffing prosthesis: SBA Prosthetic wear tolerance: 3 hours, 1-2x/day, 5 of 5 days since delivery Prosthetic weight bearing tolerance: 5 minutes Edema: pitting. Residual limb condition: 96m scab wound on medial incision, dry skin, normal color & temperature, cylindrical shape Prosthetic description: silicon liner with pin lock suspension, total contact socket, hydraulic ankle / K3 foot K code/activity level with prosthetic use: Level 3       TODAY'S TREATMENT:  DATE:  07/24/2022: Prosthetic Training with Transtibial Prosthesis: PT reviewed fall risk when prosthesis is off.   PT recommending even when sick to wear prosthesis all awake hours but reduce activities as needed.  Only remove prosthesis when in bed (place w/c beside bed as reminder if removes prosthesis) or bathing. Pt & wife verbalized understanding. Pt's glucose monitor indicated high of 269.  Pt reports taking morning meds and not eating.  PT discussed could be sign of infection some where in his body. PT reviewed need to weigh each morning to monitor fluid retention. Pt & wife verbalized understanding including need to contact his PCP or kidney MD if gains >2# in day or 5# in week.     07/17/2022: Prosthetic Training with Transtibial Prosthesis: PT demo & verbal on stepping over obstacles forward facing & side stepping.  Pt stepped over 6" hurdles X 6 forward facing 4 reps and side stepping 2 reps right & left. MinA for balance initially & progressed to CGA. PT instructed in set up for home for safety pt verbalized understanding.   Neuromuscular Re-ed for balance: Four-square stepping (1" PVC pipes) forward facing & back to cross junction to simulate work issue he reported. [place] facing both ways 2 reps without wt, 1 rep 10# kettle bell RUE & 1 rep 10# kettle bell LUE.  PT instructed in set up for home for safety pt verbalized understanding.  Therapeutic Activities: Push/pull walking forward/back 40# cable wt (20# per stack) 5 reps wt anterior & 5 reps wt posterior. 25# side stepping 10 reps ea with weight stack to right & left Pulley wt 5# hip add, flex SLR, flex bending knee, abd, ext 10 reps ea LE with single UE support. Level 2 of 3 with foot not touching on outward motion.  Cues on upright trunk / not leaning.    07/15/2022: Therapeutic Activities: PT verbally cued on height of pillow so neck not side bending when laying on his side. Pt & wife verbalized understanding.  Lifting box & carrying 100' with BUEs. 25# & 35# 1 rep ea.  Verbal cues  on prosthetic foot position for lift & lowering to  floor. PT instructed in squat at sink with prosthetic foot offset forward so stays flat with hip width to wide stance 3 positions 3 reps ea position 2 sets.  Pt verbalizes as HEP.  PT demo & verbal cues on moving 25# & 35# box 90* & push onto cart / truck bed then pulling off cart, turning 90* & lowering to floor.  Pt return demo 3 reps ea 90* right & left with correctional cues. Push/pull walking forward/back 40# cable wt (20# per stack) 5 reps wt anterior & 5 reps wt posterior. 25# side stepping 10 reps ea with weight stack to right & left Push & pull in straddle step with wt shift 40# (20# per UE) wt posterior with forward reach 10 reps & anterior with row 10 reps.  Pulley wt 5# hip add, flex SLR, flex bending knee, abd, ext 10 reps ea LE with single UE support. Level 2 of 3 with foot not touching on outward motion.  Cues on upright trunk / not leaning.    HOME EXERCISE PROGRAM: Access Code: [facility] URL: [email or web address] Date: 05/22/2022 Prepared by: Jamey Reas  Exercises - Hip Flexor Stretch at [facility] of Bed  - 1-3 x daily - 7 x weekly - 1 sets - 3 reps - 20-30 seconds hold - Supine Quadriceps Stretch with Strap on  Table  - 1-3 x daily - 7 x weekly - 1 sets - 3 reps - 20-30 seconds hold - Supine Bridge  - 1 x daily - 7 x weekly - 2-3 sets - 10 reps - 5 seconds hold - Marching Bridge  - 1 x daily - 7 x weekly - 2-3 sets - 10 reps - 5 seconds hold - Standing Terminal Knee Extension with Resistance  - 1 x daily - 7 x weekly - 2-3 sets - 10 reps - 5 seconds hold - Standing Hip Flexion with Resistance (Mirrored)  - 1 x daily - 7 x weekly - 1 sets - 10 reps - Standing Hip Adduction with Resistance (Mirrored)  - 1 x daily - 7 x weekly - 1 sets - 10 reps - Standing Hip Extension with Resistance  - 1 x daily - 7 x weekly - 1 sets - 10 reps - Standing Hip Abduction with Theraband Resistance  - 1 x daily - 7 x weekly - 1 sets - 10 reps - Standing Tandem Balance with Counter  Support  - 1 x daily - 7 x weekly - 1 sets - 3-4 reps - 30 seconds hold - Tandem Walking with Counter Support  - 1 x daily - 7 x weekly - 1 sets - 6 reps - single leg stance with one foot in lower cabinet  - 1 x daily - 7 x weekly - 1 sets - 5-10 reps - 10 seconds hold - [place] with Counter Support  - 1 x daily - 7 x weekly - 1 sets - 10 reps - Alternating Punch with Resistance  - 1 x daily - 5 x weekly - 1 sets - 10 reps - 5 seconds hold - Standing Scapular Protraction with Resistance  - 1 x daily - 7 x weekly - 1 sets - 10 reps - 5 seconds hold - Standing alternate rows with resistance  - 1 x daily - 7 x weekly - 1 sets - 10 reps - 5 seconds hold - Standing Row with Anchored Resistance  - 1 x daily - 7 x weekly - 1 sets - 10 reps - 5 seconds hold - Alternating elbow flexion with resistance  - 1 x daily - 7 x weekly - 1 sets - 10 reps - 5 seconds hold - Standing Bicep Curls with Resistance  - 1 x daily - 7 x weekly - 1 sets - 10 reps - 5 seconds hold   Do each exercise 1-2  times per day Do each exercise 5-10 repetitions Hold each exercise for 2 seconds to feel your location  AT Long.  Try to find this position when standing still for activities.   USE TAPE ON FLOOR TO MARK THE MIDLINE POSITION which is even with middle of sink.  You also should try to feel with your limb pressure in socket.  You are trying to feel with limb what you used to feel with the bottom of your foot.  Side to Side Shift: Moving your hips only (not shoulders): move weight onto your left leg, HOLD/FEEL pressure in socket.  Move back to equal weight on each leg, HOLD/FEEL pressure in socket. Move weight onto your right leg, HOLD/FEEL pressure in socket. Move back to equal weight on each leg, HOLD/FEEL pressure in socket. Repeat.  Start with both hands on sink, progress to hand on prosthetic side only, then no hands.  Front  to Back Shift: Moving  your hips only (not shoulders): move your weight forward onto your toes, HOLD/FEEL pressure in socket. Move your weight back to equal Flat Foot on both legs, HOLD/FEEL  pressure in socket. Move your weight back onto your heels, HOLD/FEEL  pressure in socket. Move your weight back to equal on both legs, HOLD/FEEL  pressure in socket. Repeat.  Start with both hands on sink, progress to hand on prosthetic side only, then no hands.  Moving Cones / Cups: With equal weight on each leg: Hold on with one hand the first time, then progress to no hand supports. Move cups from one side of sink to the other. Place cups ~2" out of your reach, progress to 10" beyond reach.  Place one hand in middle of sink and reach with other hand. Do both arms.  Then hover one hand and move cups with other hand.  Overhead/Upward Reaching: alternated reaching up to top cabinets or ceiling if no cabinets present. Keep equal weight on each leg. Start with one hand support on counter while other hand reaches and progress to no hand support with reaching.  ace one hand in middle of sink and reach with other hand. Do both arms.  Then hover one hand and move cups with other hand.  5.   Looking Over Shoulders: With equal weight on each leg: alternate turning to look over your shoulders with one hand support on counter as needed.  Start with head motions only to look in front of shoulder, then even with shoulder and progress to looking behind you. To look to side, move head /eyes, then shoulder on side looking pulls back, shift more weight to side looking and pull hip back. Place one hand in middle of sink and let go with other hand so your shoulder can pull back. Switch hands to look other way.   Then hover one hand and look over shoulder. If looking right, use left hand at sink. If looking left, use right hand at sink. 6.  Stepping with leg that is not amputated:  Move items under cabinet out of your way. Shift your hips/pelvis so weight on  prosthesis. Tighten muscles in hip on prosthetic side.  SLOWLY step other leg so front of foot is in cabinet. Then step back to floor.      ASSESSMENT:  CLINICAL IMPRESSION: Patient appeared to sick to participate in PT today.  PT educated pt which he appears to understand.   Pt continues to benefit from skilled PT.  OBJECTIVE IMPAIRMENTS: Abnormal gait, decreased activity tolerance, decreased balance, decreased endurance, decreased knowledge of condition, decreased knowledge of use of DME, decreased mobility, decreased ROM, decreased strength, increased edema, impaired flexibility, postural dysfunction, prosthetic dependency , obesity, and wound on residual limb .    ACTIVITY LIMITATIONS: carrying, lifting, bending, standing, squatting, stairs, transfers, locomotion level, and prosthetic use   PARTICIPATION LIMITATIONS: meal prep, cleaning, laundry, driving, community activity, occupation, and yard work   PERSONAL FACTORS: Fitness, Time since onset of injury/illness/exacerbation, and 3+ comorbidities: see PMH  are also affecting patient's functional outcome.    REHAB POTENTIAL: Good   CLINICAL DECISION MAKING: Evolving/moderate complexity   EVALUATION COMPLEXITY: Moderate     GOALS: Goals reviewed with patient? Yes   SHORT TERM GOALS: Target date: 06/20/2022   Patient verbalizes how to adjust ply socks. Baseline: SEE OBJECTIVE DATA Goal status: MET 06/24/2022 2.  Patient tolerates prosthesis >90% awake hrs /day without skin issues or limb pain after  standing. Baseline: SEE OBJECTIVE DATA Goal status:  MET 06/17/2022   3. Berg Balance >45/56 Baseline: SEE OBJECTIVE DATA Goal status:  MET 06/24/2022   4. Patient ambulates 400' with prosthesis with supervision. Baseline: SEE OBJECTIVE DATA Goal status:  MET 06/24/2022   5. Patient negotiates ramps & curbs with prosthesis with supervision Baseline: SEE OBJECTIVE DATA Goal status:  MET 06/24/2022   LONG TERM GOALS: Target date:  07/31/2021   Patient demonstrates & verbalized understanding of prosthetic care to enable safe utilization of prosthesis. Baseline: SEE OBJECTIVE DATA Goal status: Ongoing 05/29/2022   Patient tolerates prosthesis wear >90% of awake hours without skin or limb pain issues. Baseline: SEE OBJECTIVE DATA Goal status: Ongoing 05/29/2022   Functional Gait Assessment >22/30 to indicate lower fall risk Baseline: SEE OBJECTIVE DATA Goal status: Ongoing 05/29/2022   Patient ambulates >500' with prosthesis only independently Baseline: SEE OBJECTIVE DATA Goal status: Ongoing 05/29/2022   Patient negotiates ramps, curbs & stairs with single rail with prosthesis only independently. Baseline: SEE OBJECTIVE DATA Goal status: Ongoing 05/29/2022   Patient demonstrates & verbalizes lifting, carrying, pushing, pulling with prosthesis only safely. Baseline: SEE OBJECTIVE DATA Goal status:  Ongoing 05/29/2022     PLAN:   PT FREQUENCY: 3x/week for 5 weeks, then 1-2 x/wk for 10 weeks   PT DURATION: other: 15 weeks   PLANNED INTERVENTIONS: Therapeutic exercises, Therapeutic activity, Neuromuscular re-education, Balance training, Gait training, Patient/Family education, Self Care, Stair training, Vestibular training, Prosthetic training, DME instructions, and physical performance testing   PLAN FOR NEXT SESSION:  check LTGs with probable discharge next week,  check on stepping over obstacles & 4-square,  Continue work simulation,   continue push/pull with pulley weights  Jamey Reas, PT, DPT 07/24/2022, 8:24 AM

## 2022-07-29 ENCOUNTER — Encounter: Payer: 59 | Admitting: Physical Therapy

## 2022-07-31 ENCOUNTER — Ambulatory Visit (INDEPENDENT_AMBULATORY_CARE_PROVIDER_SITE_OTHER): Payer: 59 | Admitting: Physical Therapy

## 2022-07-31 ENCOUNTER — Encounter: Payer: Self-pay | Admitting: Physical Therapy

## 2022-07-31 DIAGNOSIS — R2689 Other abnormalities of gait and mobility: Secondary | ICD-10-CM

## 2022-07-31 DIAGNOSIS — M6281 Muscle weakness (generalized): Secondary | ICD-10-CM

## 2022-07-31 DIAGNOSIS — R2681 Unsteadiness on feet: Secondary | ICD-10-CM

## 2022-07-31 NOTE — Therapy (Signed)
OUTPATIENT PHYSICAL THERAPY TREATMENT NOTE & DISCHARGE SUMMARY   Patient Name: Raymond [name] MRN: [id number] DOB:01-01-1961, 62 y.o., male Today's Date: 07/31/2022  PCP: [name], FNP  REFERRING PROVIDER: Newt Minion, MD   PHYSICAL THERAPY DISCHARGE SUMMARY  Visits from Start of Care: 26  Current functional level related to goals / functional outcomes: See Below   Remaining deficits: Berg Balance 52/56 and Functional Gait Assessment 24/30 indicate minimal fall risk with standing ADLs & gait.    Education / Equipment: Patient was educated in prosthetic care & HEP which appears to understand.    Patient agrees to discharge. Patient goals were met. Patient is being discharged due to meeting the stated rehab goals.   END OF SESSION:   PT End of Session - 07/31/22 1024     Visit Number 26    Number of Visits 27    Date for PT Re-Evaluation 08/01/22    Authorization Type [employer]ET[employer]    Authorization Time Period NO COPAY, 35 PT/OT VISITS Calendar Year Benefit Date 05/26/2021 - 05/25/2022    Authorization - Number of Visits 35    Progress Note Due on Visit 79    PT Start Time 1020    PT Stop Time 1043    PT Time Calculation (min) 23 min    Equipment Utilized During Treatment Gait belt    Activity Tolerance Patient tolerated treatment well    Behavior During Therapy WFL for tasks assessed/performed                                Past Medical History:  Diagnosis Date   Diabetes ([place])    Diabetes mellitus without complication ([place])    type 2   ESRD (end stage renal disease) ([place])    03/09/2015- patient had a kidney transplant   Hypertension    Nausea vomiting and diarrhea 12/10/2015   Peripheral vascular disease ([place])    Renal disorder    Renal insufficiency    Past Surgical History:  Procedure Laterality Date   ABDOMINAL AORTOGRAM W/LOWER EXTREMITY N/A 09/11/2021   Procedure: ABDOMINAL AORTOGRAM W/LOWER EXTREMITY;  Surgeon: Wellington Hampshire, MD;  Location: [facility];  Service: Cardiovascular;  Laterality: N/A;   AMPUTATION Right 09/25/2021   Procedure: RIGHT TRANSMETATARSAL AMPUTATION AND APPLY TISSUE GRAFT;  Surgeon: Newt Minion, MD;  Location: [facility];  Service: Orthopedics;  Laterality: Right;   AMPUTATION Right 11/16/2021   Procedure: RIGHT AMPUTATION BELOW KNEE WITH WOUND VAC APPLICATION;  Surgeon: Newt Minion, MD;  Location: [facility];  Service: Orthopedics;  Laterality: Right;   BONE BIOPSY Right 03/06/2021   Procedure: BONE BIOPSY;  Surgeon: Trula Slade, DPM;  Location: [facility];  Service: Podiatry;  Laterality: Right;   CENTRAL VENOUS CATHETER INSERTION Right 01/20/2020   Procedure: INSERTION CENTRAL LINE ADULT; Tunneled central line;  Surgeon: Virl Cagey, MD;  Location: [facility];  Service: General;  Laterality: Right;   COLONOSCOPY N/A 12/05/2014   [id number] external and internal hemorrhoid/mild diverticulosis/11 polyps removed   ESOPHAGOGASTRODUODENOSCOPY N/A 12/05/2014   [id number] duodenitis   GRAFT APPLICATION Right [id number]   Procedure: GRAFT APPLICATION;  Surgeon: Trula Slade, DPM;  [facility]: [facility];  Service: Podiatry;  Laterality: Right;   INCISION AND DRAINAGE OF WOUND Right 08/15/2021   Procedure: IRRIGATION AND DEBRIDEMENT WOUND;  Surgeon: Landis Martins, DPM;  Location: [facility];  Service: Podiatry;  Laterality: Right;  need to make card not  a irrigation and debridement card   IR FLUORO GUIDE CV LINE RIGHT  03/01/2019   IR PERC TUN PERIT CATH WO PORT S&I /IMAG  06/27/2021   IR REMOVAL TUN CV CATH W/O FL  05/10/2019   IR REMOVAL TUN CV CATH W/O FL  02/29/2020   IR REMOVAL TUN CV CATH W/O FL  07/19/2021   IR US GUIDE VASC ACCESS RIGHT  03/01/2019   IR US GUIDE VASC ACCESS RIGHT  06/27/2021   IRRIGATION AND DEBRIDEMENT ELBOW Left 06/24/2021   Procedure: IRRIGATION AND DEBRIDEMENT ELBOW;  Surgeon: Lovell Sheehan, MD;  Location: [facility];  Service: Orthopedics;  Laterality: Left;    KIDNEY TRANSPLANT  03/27/2015   Penile Pump Insertion     PERIPHERAL VASCULAR ATHERECTOMY  09/11/2021   Procedure: PERIPHERAL VASCULAR ATHERECTOMY;  Surgeon: Wellington Hampshire, MD;  Location: [facility];  Service: Cardiovascular;;  Sh[facility]wave Lithotripsy   TEE WITHOUT CARDIOVERSION N/A 01/17/2020   Procedure: TRANSESOPHAGEAL ECHOCARDIOGRAM (TEE) WITH PROPOFOL;  Surgeon: Arnoldo Lenis, MD;  [facility];  Service: Endoscopy;  Laterality: N/A;   WOUND DEBRIDEMENT Right 03/06/2021   Procedure: DEBRIDEMENT WOUND;  Surgeon: Trula Slade, DPM;  [facility];  Service: Podiatry;  Laterality: Right;   Patient Active Problem List   Diagnosis Date Noted   Acquired absence of right leg below knee ([place]) 06/18/2022   Chronic kidney disease, stage 5 ([place]) 06/18/2022   Cutaneous abscess of right foot    Hyperkalemia 08/28/2021   Obesity (BMI 30-39.9) 08/28/2021   History of anemia due to chronic kidney disease 08/28/2021   Diabetic foot ulcer ([place]) 08/14/2021   Hypomagnesemia 06/25/2021   Peripheral vascular disease ([place]) 04/24/2020   Thrombophlebitis of superficial veins of right lower extremity    Difficult intravenous access    MRSA bacteremia    Diabetic foot infection ([place])    Infected wound 01/12/2020   Critical lower limb ischemia ([place]) 02/02/2019   Personal history of noncompliance with medical treatment, presenting hazards to health 10/07/2018   Critical limb ischemia with history of revascularization of same extremity ([place]) 07/27/2018   Mixed hyperlipidemia 02/19/2018   History of renal transplant 02/19/2018   Conductive hearing loss, bilateral 08/31/2017   Nuclear sclerotic cataract of right eye 10/07/2016   Pseudophakia of left eye 10/07/2016   Hypertension due to endocrine disorder 03/04/2016   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 ([place]) 03/04/2016   Renal failure 03/04/2016    Proliferative diabetic retinopathy with macular edema associated with type 2 diabetes mellitus ([place]) 03/04/2016   Immunosuppression ([place]) 12/10/2015   Nausea vomiting and diarrhea 12/10/2015   Type 2 diabetes mellitus ([place]) 12/10/2015   Essential hypertension, benign 12/10/2015   Pancytopenia ([place]) 12/10/2015   Encounter for screening colonoscopy 11/15/2014   GERD (gastroesophageal reflux disease) 11/15/2014    REFERRING DIAG: [id number] (ICD-10-CM) - Below-knee amputation of right lower extremit   ONSET DATE: 04/10/2022 prosthesis delivery   THERAPY DIAG:  Unsteadiness on feet  Other abnormalities of gait and mobility  Muscle weakness (generalized)  Rationale for Evaluation and Treatment Rehabilitation  PERTINENT HISTORY: DM2, PVD, ESRD s/p renal transplant 03/09/2015 on immunosuppressive meds, HTN   PRECAUTIONS: None  SUBJECTIVE:  SUBJECTIVE STATEMENT: He has cold so is tired. He wears prosthesis all awake hours.      PAIN:  Are you having pain? Residual limb pain  0/10 soreness from wearing prosthesis without ability to seat limb into socket.   OBJECTIVE: (objective measures completed at initial evaluation unless otherwise dated) POSTURE: rounded shoulders, forward head, flexed trunk , and weight shift left   LOWER EXTREMITY ROM:   ROM P:passive  A:active Right eval Right 07/31/2022  Hip flexion      Hip extension      Hip abduction      Hip adduction      Hip internal rotation      Hip external rotation      Knee flexion 100*    Knee extension -5* Seated A: 0*   Ankle dorsiflexion      Ankle plantarflexion      Ankle inversion      Ankle eversion       (Blank rows = not tested)   LOWER EXTREMITY MMT:   MMT Right eval Right 07/31/2022  Hip flexion 3+/5  5/5  Hip extension 3-/5  5/5    Hip abduction 3-/5  5/5   Hip adduction      Hip internal rotation      Hip external rotation      Knee flexion 3-/5   5/5  Knee extension 3/5   5/5  Ankle dorsiflexion      Ankle plantarflexion      Ankle inversion      Ankle eversion      (Blank rows = not tested)   TRANSFERS: 07/31/2022: sit to/from stand without UE assist independently.   04/15/2022 / Eval:  Sit to stand: 18" chair with armrests to RW with supervision.  Stand to sit: RW to 18" chair with armrests with supervision   PROSTHETIC GAIT WITH BKA PROSTHESIS: 07/31/2022:   Pt amb >500' without device safely. He neg ramps & curbs without device independently. He neg stairs with alternating pattern using single rail and with step-to pattern if no rail available modified independent.  gait velocity comfortable 3.92 ft/sec and fast pace 5.87 ft/sec See Functional Gait Assessment 24/30 below  06/30/2022: pt amb 400' with prosthesis only with lateral trunk lean towards prosthesis in stance.  No loss of balance noted.  Gait Velocity: 2.01 ft/sec   04/16/2023: Gait pattern: step to pattern, decreased arm swing- Right, decreased step length- Right, decreased stance time- Left, decreased stride length, decreased hip/knee flexion- Right, circumduction- Right, Right hip hike, knee flexed in stance- Right, antalgic, lateral hip instability, trunk flexed, and abducted- Right Distance walked: 54' with RW & 20' with cane Assistive device utilized: Single point cane, Walker - 2 wheeled, and BKA prosthesis Level of assistance: SBA with RW and Min A Gait velocity: 0.89 ft/sec Comments: excessive weight bearing on RW, partial weight on prosthesis   FUNCTIONAL TESTs:  07/31/2022: Merrilee Jansky Balance 52/56 & Functional Gait Assessment 24/30  [facility] PT Assessment - 07/31/22 1020       Standardized Balance Assessment   Standardized Balance Assessment Berg Balance Test      Berg Balance Test   Sit to Stand Able to stand without using hands and  stabilize independently    Standing Unsupported Able to stand safely 2 minutes    Sitting with Back Unsupported but Feet Supported on Floor or Stool Able to sit safely and securely 2 minutes    Stand to Sit Sits safely with minimal use  of hands    Transfers Able to transfer safely, minor use of hands    Standing Unsupported with Eyes Closed Able to stand 10 seconds safely    Standing Unsupported with Feet Together Able to place feet together independently and stand 1 minute safely    From Standing, Reach Forward with Outstretched Arm Can reach confidently >25 cm (10")    From Standing Position, Pick up Object from Floor Able to pick up shoe safely and easily    From Standing Position, Turn to Look Behind Over each Shoulder Looks behind from both sides and weight shifts well    Turn 360 Degrees Able to turn 360 degrees safely in 4 seconds or less    Standing Unsupported, Alternately Place Feet on Step/Stool Able to stand independently and safely and complete 8 steps in 20 seconds    Standing Unsupported, One Foot in Front Able to plae foot ahead of the other independently and hold 30 seconds    Standing on One Leg Tries to lift leg/unable to hold 3 seconds but remains standing independently    Total Score 52      Functional Gait  Assessment   Gait assessed  Yes    Gait Level Surface Walks 20 ft in less than 5.5 sec, no assistive devices, good speed, no evidence for imbalance, normal gait pattern, deviates no more than 6 in outside of the 12 in walkway width.    Change in Gait Speed Able to smoothly change walking speed without loss of balance or gait deviation. Deviate no more than 6 in outside of the 12 in walkway width.    Gait with Horizontal Head Turns Performs head turns smoothly with no change in gait. Deviates no more than 6 in outside 12 in walkway width    Gait with Vertical Head Turns Performs head turns with no change in gait. Deviates no more than 6 in outside 12 in walkway width.     Gait and Pivot Turn Pivot turns safely within 3 sec and stops quickly with no loss of balance.    Step Over Obstacle Is able to step over one shoe box (4.5 in total height) without changing gait speed. No evidence of imbalance.    Gait with Narrow Base of Support Ambulates less than 4 steps heel to toe or cannot perform without assistance.    Gait with Eyes Closed Walks 20 ft, no assistive devices, good speed, no evidence of imbalance, normal gait pattern, deviates no more than 6 in outside 12 in walkway width. Ambulates 20 ft in less than 7 sec.    Ambulating Backwards Walks 20 ft, uses assistive device, slower speed, mild gait deviations, deviates 6-10 in outside 12 in walkway width.    Steps Alternating feet, must use rail.    Total Score 24             06/24/2022: Berg Balance 46/56  05/12/2022:  Merrilee Jansky Balance test 42/56 TUG with prosthesis only: std 9.96sec & cog 10.94sec  04/15/2022: Timed up and go (TUG): 39.25sec with cane & BKA prosthesis Berg Balance Scale: 24/56     CURRENT PROSTHETIC WEAR ASSESSMENT: 07/31/2022:  Patient is independent with: skin check, residual limb care, care of non-amputated limb, prosthetic cleaning, ply sock cleaning, correct ply sock adjustment, proper wear schedule/adjustment, and proper weight-bearing schedule/adjustment. He donnes & doffes the prosthesis correctly & independently.  He tolerates wear >90% of awake hours with no skin issues or limb pain.    04/15/2022 /  Eval:  Patient is dependent with: skin check, residual limb care, care of non-amputated limb, prosthetic cleaning, ply sock cleaning, correct ply sock adjustment, proper wear schedule/adjustment, and proper weight-bearing schedule/adjustment Donning prosthesis: Min A Doffing prosthesis: SBA Prosthetic wear tolerance: 3 hours, 1-2x/day, 5 of 5 days since delivery Prosthetic weight bearing tolerance: 5 minutes Edema: pitting. Residual limb condition: 61m scab wound on medial incision,  dry skin, normal color & temperature, cylindrical shape Prosthetic description: silicon liner with pin lock suspension, total contact socket, hydraulic ankle / K3 foot K code/activity level with prosthetic use: Level 3       TODAY'S TREATMENT:                                                                                                                              DATE:  07/31/2022: Prosthetic Training: See objective data above. Pt able to lift & carry 25# kettle bell. He is able to push & pull weight cart independently.    07/24/2022: Prosthetic Training with Transtibial Prosthesis: PT reviewed fall risk when prosthesis is off.  PT recommending even when sick to wear prosthesis all awake hours but reduce activities as needed.  Only remove prosthesis when in bed (place w/c beside bed as reminder if removes prosthesis) or bathing. Pt & wife verbalized understanding. Pt's glucose monitor indicated high of 269.  Pt reports taking morning meds and not eating.  PT discussed could be sign of infection some where in his body. PT reviewed need to weigh each morning to monitor fluid retention. Pt & wife verbalized understanding including need to contact his PCP or kidney MD if gains >2# in day or 5# in week.     07/17/2022: Prosthetic Training with Transtibial Prosthesis: PT demo & verbal on stepping over obstacles forward facing & side stepping.  Pt stepped over 6" hurdles X 6 forward facing 4 reps and side stepping 2 reps right & left. MinA for balance initially & progressed to CGA. PT instructed in set up for home for safety pt verbalized understanding.   Neuromuscular Re-ed for balance: Four-square stepping (1" PVC pipes) forward facing & back to cross junction to simulate work issue he reported. CDel Nortefacing both ways 2 reps without wt, 1 rep 10# kettle bell RUE & 1 rep 10# kettle bell LUE.  PT instructed in set up for home for safety pt verbalized understanding.  Therapeutic  Activities: Push/pull walking forward/back 40# cable wt (20# per stack) 5 reps wt anterior & 5 reps wt posterior. 25# side stepping 10 reps ea with weight stack to right & left Pulley wt 5# hip add, flex SLR, flex bending knee, abd, ext 10 reps ea LE with single UE support. Level 2 of 3 with foot not touching on outward motion.  Cues on upright trunk / not leaning.    HOME EXERCISE PROGRAM: Access Code: [facility] LLCURL: [email or web address] Date: 05/22/2022 Prepared by: RJamey Reas Exercises - Hip Flexor  Stretch at [employer] of Bed  - 1-3 x daily - 7 x weekly - 1 sets - 3 reps - 20-30 seconds hold - Supine Quadriceps Stretch with Strap on Table  - 1-3 x daily - 7 x weekly - 1 sets - 3 reps - 20-30 seconds hold - Supine Bridge  - 1 x daily - 7 x weekly - 2-3 sets - 10 reps - 5 seconds hold - Marching Bridge  - 1 x daily - 7 x weekly - 2-3 sets - 10 reps - 5 seconds hold - Standing Terminal Knee Extension with Resistance  - 1 x daily - 7 x weekly - 2-3 sets - 10 reps - 5 seconds hold - Standing Hip Flexion with Resistance (Mirrored)  - 1 x daily - 7 x weekly - 1 sets - 10 reps - Standing Hip Adduction with Resistance (Mirrored)  - 1 x daily - 7 x weekly - 1 sets - 10 reps - Standing Hip Extension with Resistance  - 1 x daily - 7 x weekly - 1 sets - 10 reps - Standing Hip Abduction with Theraband Resistance  - 1 x daily - 7 x weekly - 1 sets - 10 reps - Standing Tandem Balance with Counter Support  - 1 x daily - 7 x weekly - 1 sets - 3-4 reps - 30 seconds hold - Tandem Walking with Counter Support  - 1 x daily - 7 x weekly - 1 sets - 6 reps - single leg stance with one foot in lower cabinet  - 1 x daily - 7 x weekly - 1 sets - 5-10 reps - 10 seconds hold - Carioca with Counter Support  - 1 x daily - 7 x weekly - 1 sets - 10 reps - Alternating Punch with Resistance  - 1 x daily - 5 x weekly - 1 sets - 10 reps - 5 seconds hold - Standing Scapular Protraction with Resistance  - 1 x  daily - 7 x weekly - 1 sets - 10 reps - 5 seconds hold - Standing alternate rows with resistance  - 1 x daily - 7 x weekly - 1 sets - 10 reps - 5 seconds hold - Standing Row with Anchored Resistance  - 1 x daily - 7 x weekly - 1 sets - 10 reps - 5 seconds hold - Alternating elbow flexion with resistance  - 1 x daily - 7 x weekly - 1 sets - 10 reps - 5 seconds hold - Standing Bicep Curls with Resistance  - 1 x daily - 7 x weekly - 1 sets - 10 reps - 5 seconds hold   Do each exercise 1-2  times per day Do each exercise 5-10 repetitions Hold each exercise for 2 seconds to feel your location  AT [place].  Try to find this position when standing still for activities.   USE TAPE ON FLOOR TO MARK THE MIDLINE POSITION which is even with middle of sink.  You also should try to feel with your limb pressure in socket.  You are trying to feel with limb what you used to feel with the bottom of your foot.  Side to Side Shift: Moving your hips only (not shoulders): move weight onto your left leg, HOLD/FEEL pressure in socket.  Move back to equal weight on each leg, HOLD/FEEL pressure in socket. Move weight onto your right leg, HOLD/FEEL pressure in socket. Move  back to equal weight on each leg, HOLD/FEEL pressure in socket. Repeat.  Start with both hands on sink, progress to hand on prosthetic side only, then no hands.  Front to Back Shift: Moving your hips only (not shoulders): move your weight forward onto your toes, HOLD/FEEL pressure in socket. Move your weight back to equal Flat Foot on both legs, HOLD/FEEL  pressure in socket. Move your weight back onto your heels, HOLD/FEEL  pressure in socket. Move your weight back to equal on both legs, HOLD/FEEL  pressure in socket. Repeat.  Start with both hands on sink, progress to hand on prosthetic side only, then no hands.  Moving Cones / Cups: With equal weight on each leg: Hold on with one  hand the first time, then progress to no hand supports. Move cups from one side of sink to the other. Place cups ~2" out of your reach, progress to 10" beyond reach.  Place one hand in middle of sink and reach with other hand. Do both arms.  Then hover one hand and move cups with other hand.  Overhead/Upward Reaching: alternated reaching up to top cabinets or ceiling if no cabinets present. Keep equal weight on each leg. Start with one hand support on counter while other hand reaches and progress to no hand support with reaching.  ace one hand in middle of sink and reach with other hand. Do both arms.  Then hover one hand and move cups with other hand.  5.   Looking Over Shoulders: With equal weight on each leg: alternate turning to look over your shoulders with one hand support on counter as needed.  Start with head motions only to look in front of shoulder, then even with shoulder and progress to looking behind you. To look to side, move head /eyes, then shoulder on side looking pulls back, shift more weight to side looking and pull hip back. Place one hand in middle of sink and let go with other hand so your shoulder can pull back. Switch hands to look other way.   Then hover one hand and look over shoulder. If looking right, use left hand at sink. If looking left, use right hand at sink. 6.  Stepping with leg that is not amputated:  Move items under cabinet out of your way. Shift your hips/pelvis so weight on prosthesis. Tighten muscles in hip on prosthetic side.  SLOWLY step other leg so front of foot is in cabinet. Then step back to floor.      ASSESSMENT:  CLINICAL IMPRESSION: Patient met all long term goals.  He appears to be safely functioning with prosthesis at full community level.  He demonstrates understanding of work related tasks.   OBJECTIVE IMPAIRMENTS: Abnormal gait, decreased activity tolerance, decreased balance, decreased endurance, decreased knowledge of condition, decreased knowledge  of use of DME, decreased mobility, decreased ROM, decreased strength, increased edema, impaired flexibility, postural dysfunction, prosthetic dependency , obesity, and wound on residual limb .    ACTIVITY LIMITATIONS: carrying, lifting, bending, standing, squatting, stairs, transfers, locomotion level, and prosthetic use   PARTICIPATION LIMITATIONS: meal prep, cleaning, laundry, driving, community activity, occupation, and yard work   PERSONAL FACTORS: Fitness, Time since onset of injury/illness/exacerbation, and 3+ comorbidities: see PMH  are also affecting patient's functional outcome.    REHAB POTENTIAL: Good   CLINICAL DECISION MAKING: Evolving/moderate complexity   EVALUATION COMPLEXITY: Moderate     GOALS: Goals reviewed with patient? Yes   SHORT TERM GOALS: Target date: 06/20/2022  Patient verbalizes how to adjust ply socks. Baseline: SEE OBJECTIVE DATA Goal status: MET 06/24/2022 2.  Patient tolerates prosthesis >90% awake hrs /day without skin issues or limb pain after standing. Baseline: SEE OBJECTIVE DATA Goal status:  MET 06/17/2022   3. Berg Balance >45/56 Baseline: SEE OBJECTIVE DATA Goal status:  MET 06/24/2022   4. Patient ambulates 400' with prosthesis with supervision. Baseline: SEE OBJECTIVE DATA Goal status:  MET 06/24/2022   5. Patient negotiates ramps & curbs with prosthesis with supervision Baseline: SEE OBJECTIVE DATA Goal status:  MET 06/24/2022   LONG TERM GOALS: Target date: 07/31/2021   Patient demonstrates & verbalized understanding of prosthetic care to enable safe utilization of prosthesis. Baseline: SEE OBJECTIVE DATA Goal status: MET 07/31/2022   Patient tolerates prosthesis wear >90% of awake hours without skin or limb pain issues. Baseline: SEE OBJECTIVE DATA Goal status: MET 07/31/2022   Functional Gait Assessment >22/30 to indicate lower fall risk Baseline: SEE OBJECTIVE DATA Goal status: MET 07/31/2022   Patient ambulates >500' with  prosthesis only independently Baseline: SEE OBJECTIVE DATA Goal status: MET 07/31/2022   Patient negotiates ramps, curbs & stairs with single rail with prosthesis only independently. Baseline: SEE OBJECTIVE DATA Goal status: MET 07/31/2022   Patient demonstrates & verbalizes lifting, carrying, pushing, pulling with prosthesis only safely. Baseline: SEE OBJECTIVE DATA Goal status:  MET 07/31/2022     PLAN:   PT FREQUENCY: 3x/week for 5 weeks, then 1-2 x/wk for 10 weeks   PT DURATION: other: 15 weeks   PLANNED INTERVENTIONS: Therapeutic exercises, Therapeutic activity, Neuromuscular re-education, Balance training, Gait training, Patient/Family education, Self Care, Stair training, Vestibular training, Prosthetic training, DME instructions, and physical performance testing   PLAN FOR NEXT SESSION: Discharge Physical Therapy.   Jamey Reas, PT, DPT 07/31/2022, 10:58 AM

## 2022-08-01 ENCOUNTER — Other Ambulatory Visit: Payer: Self-pay | Admitting: Endocrinology

## 2022-08-01 ENCOUNTER — Other Ambulatory Visit: Payer: Self-pay | Admitting: Nurse Practitioner

## 2022-08-19 ENCOUNTER — Ambulatory Visit: Payer: 59 | Admitting: Endocrinology

## 2022-08-23 ENCOUNTER — Other Ambulatory Visit: Payer: Self-pay | Admitting: Endocrinology

## 2022-08-23 DIAGNOSIS — E1165 Type 2 diabetes mellitus with hyperglycemia: Secondary | ICD-10-CM

## 2022-08-28 NOTE — Telephone Encounter (Signed)
His CT scan shows thickening to the colon wall when was his last colonoscopy we may need to repeat. He has fatty liver which improves with weight loss and limiting intake of fatty foods. He does have gallstones but no acute infection, if he would like a referral to surgery let me know. 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 He has thickening to his bronchial airways is he having any coughing? He also has aortic atherosclerosis which is plaque build up and should be taking a cholesterol medication to help. None of these cause abdomen distention.

## 2022-08-28 NOTE — Telephone Encounter (Signed)
Attempted to reach patient to review results.   LVM for return call

## 2022-09-01 NOTE — Telephone Encounter (Signed)
LVM for return call. 

## 2022-09-01 NOTE — Telephone Encounter (Signed)
Spoke with patient regarding results and recommendations. He would like referral to go back to GI. He is willing to start statin rx.   Per chart last colonoscopy shows 12/05/2014, report is in Media tab. This may have been done as inpatient, based on notes.  Please advise regarding referral and rx. Thanks!

## 2022-09-29 ENCOUNTER — Ambulatory Visit: Payer: 59 | Admitting: Orthopedic Surgery

## 2022-10-16 ENCOUNTER — Encounter: Payer: Self-pay | Admitting: Nurse Practitioner

## 2022-10-16 ENCOUNTER — Ambulatory Visit (INDEPENDENT_AMBULATORY_CARE_PROVIDER_SITE_OTHER): Payer: 59 | Admitting: Nurse Practitioner

## 2022-10-16 VITALS — BP 160/70 | HR 98 | Temp 98.4°F | Ht 67.0 in | Wt 240.0 lb

## 2022-10-16 DIAGNOSIS — Z1159 Encounter for screening for other viral diseases: Secondary | ICD-10-CM

## 2022-10-16 DIAGNOSIS — R5383 Other fatigue: Secondary | ICD-10-CM

## 2022-10-16 DIAGNOSIS — N182 Chronic kidney disease, stage 2 (mild): Secondary | ICD-10-CM | POA: Diagnosis not present

## 2022-10-16 DIAGNOSIS — Z23 Encounter for immunization: Secondary | ICD-10-CM

## 2022-10-16 DIAGNOSIS — I129 Hypertensive chronic kidney disease with stage 1 through stage 4 chronic kidney disease, or unspecified chronic kidney disease: Secondary | ICD-10-CM | POA: Diagnosis not present

## 2022-10-16 DIAGNOSIS — Z8669 Personal history of other diseases of the nervous system and sense organs: Secondary | ICD-10-CM

## 2022-10-16 DIAGNOSIS — E1122 Type 2 diabetes mellitus with diabetic chronic kidney disease: Secondary | ICD-10-CM

## 2022-10-16 DIAGNOSIS — Z794 Long term (current) use of insulin: Secondary | ICD-10-CM

## 2022-10-16 DIAGNOSIS — E6609 Other obesity due to excess calories: Secondary | ICD-10-CM

## 2022-10-16 DIAGNOSIS — E782 Mixed hyperlipidemia: Secondary | ICD-10-CM

## 2022-10-16 DIAGNOSIS — Z6837 Body mass index (BMI) 37.0-37.9, adult: Secondary | ICD-10-CM

## 2022-10-16 MED ORDER — ALBUTEROL SULFATE HFA 108 (90 BASE) MCG/ACT IN AERS: 2.0000 | INHALATION_SPRAY | Freq: Four times a day (QID) | RESPIRATORY_TRACT | 2 refills | Status: DC | PRN

## 2022-10-16 NOTE — Progress Notes (Signed)
Raymond [name],acting as a Neurosurgeon for Raymond Felts, FNP.,have documented all relevant documentation on the behalf of Raymond Felts, FNP,as directed by  Raymond Felts, FNP while in the presence of Raymond Felts, FNP.    Subjective:     Patient ID: Raymond [name] , male    DOB: June 28, 1960 , 62 y.o.   MRN: [id number]   Chief Complaint  Patient presents with   Diabetes    HPI  Patient presents today for a BP and DM check. Patient reports compliance with medications and has no other concerns today. Continues to see endocrinology his appt is at the end of this month. Blood sugars he says are looking better. His last visit with Transplant team in [place] on May 14th. He has had labs this morning at [facility]. HgbA1c is down to 7.9 in January.   Reports he does not sleep, if he goes asleep at 8p he is up at 12a. Will stay up until 4 am will doze off until 6a. He reports he had a sleep study more than 10 years ago and was diagnosed with sleep apnea. He does not wear a CPAP.   BP Readings from Last 3 Encounters: 10/16/22 : (!) 160/70 06/18/22 : 134/78 05/22/22 : 124/82    Diabetes He presents for his follow-up diabetic visit. He has type 2 diabetes mellitus. There are no hypoglycemic associated symptoms. Pertinent negatives for hypoglycemia include no nervousness/anxiousness. Associated symptoms include fatigue. Pertinent negatives for diabetes include no chest pain, no polydipsia, no polyphagia and no polyuria. There are no hypoglycemic complications. There are no diabetic complications. Risk factors for coronary artery disease include obesity, sedentary lifestyle, male sex, hypertension, dyslipidemia and diabetes mellitus (Wheelchair bound). Current diabetic treatment includes oral agent (dual therapy).  Hypertension Pertinent negatives include no chest pain or palpitations.     Past Medical History:  Diagnosis Date   Diabetes (HCC)    Diabetes mellitus without complication (HCC)     type 2   ESRD (end stage renal disease) (HCC)    03/09/2015- patient had a kidney transplant   Hypertension    Nausea vomiting and diarrhea 12/10/2015   Peripheral vascular disease (HCC)    Renal disorder    Renal insufficiency      Family History  Problem Relation Age of Onset   Diabetes Mother    Heart disease Father    Colon cancer Neg Hx      Current Outpatient Medications:    albuterol (VENTOLIN HFA) 108 (90 Base) MCG/ACT inhaler, Inhale 2 puffs into the lungs every 6 (six) hours as needed for wheezing or shortness of breath., Disp: 8 g, Rfl: 2   atorvastatin (LIPITOR) 10 MG tablet, Take 1 tablet (10 mg total) by mouth daily., Disp: 90 tablet, Rfl: 3   blood glucose meter kit and supplies KIT, Dispense based on patient and insurance preference. Use up to four times daily as directed. (FOR ICD-9 250.00, 250.01)., Disp: 1 each, Rfl: 0   Blood Glucose Monitoring Suppl (ACCU-CHEK GUIDE ME) w/Device KIT, 1 Piece by Does not apply route as directed., Disp: 1 kit, Rfl: 0   brimonidine (ALPHAGAN) 0.2 % ophthalmic solution, Place 1 drop into both eyes 3 (three) times daily., Disp: , Rfl:    cloNIDine (CATAPRES - DOSED IN MG/24 HR) 0.3 mg/24hr patch, Place 1 patch (0.3 mg total) onto the skin once a week. (Patient taking differently: Place 0.3 mg onto the skin once a week. Place patch on Sunday), Disp: 4 patch, Rfl:  12   Continuous Blood Gluc Receiver (DEXCOM G7 RECEIVER) DEVI, Use to check blood sugar 3 times a day. Dx code e11.65, Disp: 1 each, Rfl: 1   Continuous Blood Gluc Sensor (DEXCOM G7 SENSOR) MISC, USE TO CHECK GLUCOSE THREE TIMES DAILY AS DIRECTED, CHANGE SENSOR EVERY 10 DAYS., Disp: 3 each, Rfl: 5   docusate sodium (COLACE) 100 MG capsule, Take 1 capsule (100 mg total) by mouth daily., Disp: 10 capsule, Rfl: 0   dorzolamide-timolol (COSOPT) 22.3-6.8 MG/ML ophthalmic solution, Place 1 drop into both eyes 2 (two) times daily., Disp: , Rfl:    doxazosin (CARDURA) 4 MG tablet, Take  4 mg by mouth daily., Disp: , Rfl:    Dulaglutide (TRULICITY) 3 MG/0.5ML SOPN, Inject 3 mg as directed once a week., Disp: 6 mL, Rfl: 1   furosemide (LASIX) 20 MG tablet, 20 mg 2 (two) times daily., Disp: , Rfl:    gabapentin (NEURONTIN) 300 MG capsule, Take 3 capsules (900 mg total) by mouth 3 (three) times daily. 3 times a day, Disp: 270 capsule, Rfl: 3   glucose blood (ACCU-CHEK GUIDE) test strip, Use as instructed 4 x daily. E11.65 (Patient taking differently: 3 (three) times daily.), Disp: 150 each, Rfl: 5   hydrALAZINE (APRESOLINE) 25 MG tablet, Take 1 tablet (25 mg total) by mouth 3 (three) times daily., Disp: 270 tablet, Rfl: 1   latanoprost (XALATAN) 0.005 % ophthalmic solution, Place 1 drop into both eyes at bedtime., Disp: , Rfl:    Multiple Vitamin (MULTIVITAMIN) capsule, Take 1 capsule by mouth daily., Disp: , Rfl:    mupirocin ointment (BACTROBAN) 2 %, Apply 1 application. topically 2 (two) times daily. For wound care, Disp: 30 g, Rfl: 0   NOVOLIN N FLEXPEN 100 UNIT/ML FlexPen, INJECT 30 UNITS SUBCUTANEOUSLY IN THE MORNING AND 45 UNITS AT BEDTIME, Disp: 15 mL, Rfl: 0   NOVOLIN R FLEXPEN RELION 100 UNIT/ML FlexPen, INJECT 1-9 UNITS SUBCUTANEOUSLY AS DIRECTED THREE TIMES DAILY BEFORE MEALS USING INCLUDED SLIDING SCALE, Disp: 15 mL, Rfl: 0   NOVOLOG FLEXPEN 100 UNIT/ML FlexPen, INJECT 25 UNITS SUBCUTANEOUSLY BEFORE MEAL(S), Disp: 15 mL, Rfl: 0   ondansetron (ZOFRAN) 4 MG tablet, Take 1 tablet (4 mg total) by mouth every 6 (six) hours as needed for nausea., Disp: 20 tablet, Rfl: 0   sodium bicarbonate 650 MG tablet, Take 1,300 mg by mouth 3 (three) times daily., Disp: , Rfl:    sodium zirconium cyclosilicate (LOKELMA) 10 g PACK packet, Take 10 g by mouth daily., Disp: 30 packet, Rfl: 1   tacrolimus (PROGRAF) 1 MG capsule, Take 4 capsules (4 mg total) by mouth 2 (two) times daily., Disp: , Rfl:    Vitamin D, Ergocalciferol, (DRISDOL) 50000 units CAPS capsule, Take 50,000 Units by mouth See  admin instructions. 15th of the month, Disp: , Rfl:   Current Facility-Administered Medications:    sodium chloride flush (NS) 0.9 % injection 3 mL, 3 mL, Intravenous, Q12H, Arida, Muhammad A, MD   Allergies  Allergen Reactions   Doxycycline     Runs up pt's blood sugar and phosphorus   Bactrim [Sulfamethoxazole-Trimethoprim]     Runs pt's sugar and phosphorus   Flagyl [Metronidazole] Rash    Patient having hives to either the vancomycin or flagyl (both infusing together).   Vancomycin Rash    Patient having hives to either vancomycin or flagyl     Review of Systems  Constitutional:  Positive for fatigue.  Respiratory: Negative.    Cardiovascular:  Negative for  chest pain, palpitations and leg swelling.  Endocrine: Negative for polydipsia, polyphagia and polyuria.  Psychiatric/Behavioral: Negative.  The patient is not nervous/anxious.      Today's Vitals   10/16/22 1411  BP: (!) 160/70  Pulse: 98  Temp: 98.4 F (36.9 C)  TempSrc: Oral  Weight: 240 lb (108.9 kg)  Height: 5\' 7"  (1.702 m)  PainSc: 0-No pain   Body mass index is 37.59 kg/m.  Wt Readings from Last 3 Encounters:  10/16/22 240 lb (108.9 kg)  06/18/22 246 lb (111.6 kg)  05/22/22 241 lb (109.3 kg)    The ASCVD Risk score (Arnett DK, et al., 2019) failed to calculate for the following reasons:   The valid total cholesterol range is 130 to 320 mg/dL  Objective:  Physical Exam Vitals reviewed.  Constitutional:      General: He is not in acute distress.    Appearance: Normal appearance. He is obese.  Cardiovascular:     Rate and Rhythm: Normal rate and regular rhythm.     Pulses: Normal pulses.     Heart sounds: Normal heart sounds. No murmur heard. Pulmonary:     Effort: Pulmonary effort is normal. No respiratory distress.     Breath sounds: Normal breath sounds. No wheezing.  Skin:    General: Skin is warm.     Capillary Refill: Capillary refill takes less than 2 seconds.  Neurological:      General: No focal deficit present.     Mental Status: He is alert and oriented to person, place, and time.     Cranial Nerves: No cranial nerve deficit.     Motor: No weakness.         Assessment And Plan:     1. Type 2 diabetes mellitus with stage 2 chronic kidney disease, with long-term current use of insulin (HCC) Comments: Hemoglobin A1c is stable.  Continue follow-up with endocrinology.  No new changes  2. Benign hypertension with chronic kidney disease Comments: Blood pressure is elevated today.  Advised to focus on lifestyle modifications.  No repeat done today at office visit.  3. Other fatigue Comments: Metabolic causes more normal.  Will refer for sleep study this may be affecting his energy level. - Iron, TIBC and Ferritin Panel - TSH - Hemoglobin A1c - Vitamin B12 - Vitamin D (25 hydroxy) - Ambulatory referral to Sleep Studies  4. Class 2 obesity due to excess calories with body mass index (BMI) of 37.0 to 37.9 in adult, unspecified whether serious comorbidity present he is encouraged to strive for BMI less than 30 to decrease cardiac risk. Advised to aim for at least 150 minutes of exercise per week.  5. Encounter for hepatitis C screening test for low risk patient - Hepatitis C antibody  6. History of sleep apnea Comments: Is history of sleep apnea.  Needs to be reevaluated for study was CPAP machine.  Last sleep study was 10 years ago - Ambulatory referral to Sleep Studies    Return for controlled DM check 4 months.  Patient was given opportunity to ask questions. Patient verbalized understanding of the plan and was able to repeat key elements of the plan. All questions were answered to their satisfaction.  Raymond Felts, FNP   I, Raymond Felts, FNP, have reviewed all documentation for this visit. The documentation on 10/16/22 for the exam, diagnosis, procedures, and orders are all accurate and complete.   IF YOU HAVE BEEN REFERRED TO A SPECIALIST, IT MAY TAKE  1-2 WEEKS  TO SCHEDULE/PROCESS THE REFERRAL. IF YOU HAVE NOT HEARD FROM US/SPECIALIST IN TWO WEEKS, PLEASE GIVE [place] A CALL AT [phone].   THE PATIENT IS ENCOURAGED TO PRACTICE SOCIAL DISTANCING DUE TO THE COVID-19 PANDEMIC.

## 2022-10-16 NOTE — Patient Instructions (Addendum)
Hypertension, Adult Hypertension is another name for high blood pressure. High blood pressure forces your heart to work harder to pump blood. This can cause problems over time. There are two numbers in a blood pressure reading. There is a top number (systolic) over a bottom number (diastolic). It is best to have a blood pressure that is below 120/80. What are the causes? The cause of this condition is not known. Some other conditions can lead to high blood pressure. What increases the risk? Some lifestyle factors can make you more likely to develop high blood pressure: Smoking. Not getting enough exercise or physical activity. Being overweight. Having too much fat, sugar, calories, or salt (sodium) in your diet. Drinking too much alcohol. Other risk factors include: Having any of these conditions: Heart disease. Diabetes. High cholesterol. Kidney disease. Obstructive sleep apnea. Having a family history of high blood pressure and high cholesterol. Age. The risk increases with age. Stress. What are the signs or symptoms? High blood pressure may not cause symptoms. Very high blood pressure (hypertensive crisis) may cause: Headache. Fast or uneven heartbeats (palpitations). Shortness of breath. Nosebleed. Vomiting or feeling like you may vomit (nauseous). Changes in how you see. Very bad chest pain. Feeling dizzy. Seizures. How is this treated? This condition is treated by making healthy lifestyle changes, such as: Eating healthy foods. Exercising more. Drinking less alcohol. Your doctor may prescribe medicine if lifestyle changes do not help enough and if: Your top number is above 130. Your bottom number is above 80. Your personal target blood pressure may vary. Follow these instructions at home: Eating and drinking  If told, follow the DASH eating plan. To follow this plan: Fill one half of your plate at each meal with fruits and vegetables. Fill one fourth of your plate  at each meal with whole grains. Whole grains include whole-wheat pasta, brown rice, and whole-grain bread. Eat or drink low-fat dairy products, such as skim milk or low-fat yogurt. Fill one fourth of your plate at each meal with low-fat (lean) proteins. Low-fat proteins include fish, chicken without skin, eggs, beans, and tofu. Avoid fatty meat, cured and processed meat, or chicken with skin. Avoid pre-made or processed food. Limit the amount of salt in your diet to less than 1,500 mg each day. Do not drink alcohol if: Your doctor tells you not to drink. You are pregnant, may be pregnant, or are planning to become pregnant. If you drink alcohol: Limit how much you have to: 0-1 drink a day for women. 0-2 drinks a day for men. Know how much alcohol is in your drink. In the [place] one drink equals one 12 oz bottle of beer (355 mL), one 5 oz glass of wine (148 mL), or one 1 oz glass of hard liquor (44 mL). Lifestyle  Work with your doctor to stay at a healthy weight or to lose weight. Ask your doctor what the best weight is for you. Get at least 30 minutes of exercise that causes your heart to beat faster (aerobic exercise) most days of the week. This may include walking, swimming, or biking. Get at least 30 minutes of exercise that strengthens your muscles (resistance exercise) at least 3 days a week. This may include lifting weights or doing Pilates. Do not smoke or use any products that contain nicotine or tobacco. If you need help quitting, ask your doctor. Check your blood pressure at home as told by your doctor. Keep all follow-up visits. Medicines Take over-the-counter and prescription medicines  only as told by your doctor. Follow directions carefully. Do not skip doses of blood pressure medicine. The medicine does not work as well if you skip doses. Skipping doses also puts you at risk for problems. Ask your doctor about side effects or reactions to medicines that you should watch  for. Contact a doctor if: You think you are having a reaction to the medicine you are taking. You have headaches that keep coming back. You feel dizzy. You have swelling in your ankles. You have trouble with your vision. Get help right away if: You get a very bad headache. You start to feel mixed up (confused). You feel weak or numb. You feel faint. You have very bad pain in your: Chest. Belly (abdomen). You vomit more than once. You have trouble breathing. These symptoms may be an emergency. Get help right away. Call 911. Do not wait to see if the symptoms will go away. Do not drive yourself to the hospital. Summary Hypertension is another name for high blood pressure. High blood pressure forces your heart to work harder to pump blood. For most people, a normal blood pressure is less than 120/80. Making healthy choices can help lower blood pressure. If your blood pressure does not get lower with healthy choices, you may need to take medicine. This information is not intended to replace advice given to you by your health care provider. Make sure you discuss any questions you have with your health care provider. Document Revised: 02/28/2021 Document Reviewed: 02/28/2021 Elsevier Patient Education  2024 [employer]   When you see Dr. Lucianne Muss see if they are willing to change your trulicity to [facility].

## 2022-10-17 LAB — TSH: TSH: 1.1 u[IU]/mL (ref 0.450–4.500)

## 2022-10-17 LAB — IRON,TIBC AND FERRITIN PANEL
Ferritin: 195 ng/mL (ref 30–400)
Iron Saturation: 17 % (ref 15–55)
Iron: 43 ug/dL (ref 38–169)
Total Iron Binding Capacity: 252 ug/dL (ref 250–450)
UIBC: 209 ug/dL (ref 111–343)

## 2022-10-17 LAB — VITAMIN D 25 HYDROXY (VIT D DEFICIENCY, FRACTURES): Vit D, 25-Hydroxy: 16.3 ng/mL — ABNORMAL LOW (ref 30.0–100.0)

## 2022-10-17 LAB — HEMOGLOBIN A1C
Est. average glucose Bld gHb Est-mCnc: 157 mg/dL
Hgb A1c MFr Bld: 7.1 % — ABNORMAL HIGH (ref 4.8–5.6)

## 2022-10-17 LAB — HEPATITIS C ANTIBODY: Hep C Virus Ab: NONREACTIVE

## 2022-10-17 LAB — VITAMIN B12: Vitamin B-12: 674 pg/mL (ref 232–1245)

## 2022-10-21 ENCOUNTER — Ambulatory Visit: Payer: 59 | Admitting: Nurse Practitioner

## 2022-10-30 ENCOUNTER — Ambulatory Visit: Payer: 59 | Admitting: Orthopedic Surgery

## 2022-10-30 DIAGNOSIS — Z89511 Acquired absence of right leg below knee: Secondary | ICD-10-CM | POA: Diagnosis not present

## 2022-10-30 DIAGNOSIS — S88111A Complete traumatic amputation at level between knee and ankle, right lower leg, initial encounter: Secondary | ICD-10-CM

## 2022-11-06 ENCOUNTER — Other Ambulatory Visit: Payer: Self-pay | Admitting: Nurse Practitioner

## 2022-11-06 DIAGNOSIS — R140 Abdominal distension (gaseous): Secondary | ICD-10-CM

## 2022-11-10 ENCOUNTER — Encounter: Payer: Self-pay | Admitting: Orthopedic Surgery

## 2022-11-10 NOTE — Progress Notes (Signed)
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  He is a Registry patient.  Surgery November 16, 2021.  Assessment & Plan: Visit Diagnoses:  1. Below-knee amputation of right lower extremity, initial encounter (HCC)     Plan: No restrictions follow-up as needed.  Follow-Up Instructions: No follow-ups on file.   Ortho Exam  Patient is alert, oriented, no adenopathy, well-dressed, normal affect, normal respiratory effort. Examination patient has full range of motion of the right knee with range of motion 0 to 120 degrees.  The residual limb is well consolidated.  Imaging: No results found. No images are attached to the encounter.  Labs: Lab Results  Component Value Date   HGBA1C 7.1 (H) 10/16/2022   HGBA1C 7.9 (H) 06/18/2022   HGBA1C 9.2 (H) 05/13/2022   ESRSEDRATE 88 (H) 08/14/2021   ESRSEDRATE 47 (H) 07/12/2021   ESRSEDRATE 39 (H) 07/08/2021   CRP 25.2 (H) 08/14/2021   CRP 11.6 (H) 06/27/2021   CRP 2.7 04/17/2021   REPTSTATUS 11/20/2021 FINAL 11/15/2021   REPTSTATUS 11/20/2021 FINAL 11/15/2021   GRAMSTAIN  09/25/2021    RARE WBC PRESENT, PREDOMINANTLY PMN RARE GRAM POSITIVE COCCI IN PAIRS    CULT  11/15/2021    NO GROWTH 5 DAYS Performed at [facility], [address] [place], [place] [address]    CULT  11/15/2021    NO GROWTH 5 DAYS Performed at [facility], [address] [place], [place] [address]    [facility] STAPHYLOCOCCUS LUGDUNENSIS 09/25/2021     Lab Results   Component Value Date   ALBUMIN 4.0 05/13/2022   ALBUMIN 3.5 02/16/2022   ALBUMIN 2.0 (L) 11/22/2021   PREALBUMIN <5 (L) 08/14/2021    Lab Results  Component Value Date   MG 1.5 (L) 11/29/2021   MG 1.4 (L) 11/28/2021   MG 1.3 (L) 08/29/2021   Lab Results  Component Value Date   VD25OH 16.3 (L) 10/16/2022   VD25OH 23.8 (L) 12/11/2020    Lab Results  Component Value Date   PREALBUMIN <5 (L) 08/14/2021      Latest Ref Rng & Units 02/16/2022   12:40 AM 11/28/2021    6:36 PM 11/22/2021    6:40 AM  CBC EXTENDED  WBC 4.0 - 10.5 K/uL 3.4  4.2  6.0   RBC 4.22 - 5.81 MIL/uL 5.05  3.88  3.93   Hemoglobin 13.0 - 17.0 g/dL 52.8  8.4  8.5   HCT 41.3 - 52.0 % 40.5  30.4  30.9   Platelets 150 - 400 K/uL 127  422  444   NEUT# 1.7 - 7.7 K/uL 1.4  2.7  4.6   Lymph# 0.7 - 4.0 K/uL 1.4  1.1  1.0      There is no height or weight on file to calculate BMI.  Orders:  No orders of the defined types were placed in this encounter.  No orders of the defined types were placed in this encounter.    Procedures: No procedures performed  Clinical Data: No additional findings.  ROS:  All other systems negative, except as noted in the HPI. Review of Systems  Objective: Vital Signs: There were no vitals taken for this visit.  Specialty Comments:  No specialty comments available.  PMFS History: Patient Active Problem List   Diagnosis Date Noted   Other fatigue 10/16/2022   Acquired absence of right leg below knee (HCC) 06/18/2022   Chronic kidney disease, stage 5 (HCC) 06/18/2022   Cutaneous abscess of right foot    Hyperkalemia 08/28/2021   Obesity (BMI 30-39.9) 08/28/2021   History of anemia due to chronic kidney disease 08/28/2021   Diabetic foot ulcer (HCC) 08/14/2021   Hypomagnesemia 06/25/2021   Peripheral vascular disease (HCC) 04/24/2020   Thrombophlebitis of superficial veins of right lower extremity    Difficult intravenous access    MRSA bacteremia    Diabetic foot  infection (HCC)    Infected wound 01/12/2020   Critical lower limb ischemia (HCC) 02/02/2019   Personal history of noncompliance with medical treatment, presenting hazards to health 10/07/2018   Critical limb ischemia with history of revascularization of same extremity (HCC) 07/27/2018   Mixed hyperlipidemia 02/19/2018   History of renal transplant 02/19/2018   Conductive hearing loss, bilateral 08/31/2017   Nuclear sclerotic cataract of right eye 10/07/2016   Pseudophakia of left eye 10/07/2016   Hypertension due to endocrine disorder 03/04/2016   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 (HCC) 03/04/2016   Renal failure 03/04/2016   Proliferative diabetic retinopathy with macular edema associated with type 2 diabetes mellitus (HCC) 03/04/2016   Immunosuppression (HCC) 12/10/2015   Nausea vomiting and diarrhea 12/10/2015   Type 2 diabetes mellitus (HCC) 12/10/2015   Benign hypertension with chronic kidney disease 12/10/2015   Pancytopenia (HCC) 12/10/2015   Encounter for screening colonoscopy 11/15/2014   GERD (gastroesophageal reflux disease) 11/15/2014   Past Medical History:  Diagnosis Date   Diabetes (HCC)    Diabetes mellitus without complication (HCC)    type 2   ESRD (end stage renal disease) (HCC)    03/09/2015- patient had a kidney transplant   Hypertension    Nausea vomiting and diarrhea 12/10/2015   Peripheral vascular disease (HCC)    Renal disorder    Renal insufficiency     Family History  Problem Relation Age of Onset   Diabetes Mother    Heart disease Father    Colon cancer Neg Hx     Past Surgical History:  Procedure Laterality Date   ABDOMINAL AORTOGRAM W/LOWER EXTREMITY N/A 09/11/2021   Procedure: ABDOMINAL AORTOGRAM W/LOWER EXTREMITY;  Surgeon: Iran Ouch, MD;  Location: [facility];  Service: Cardiovascular;  Laterality: N/A;   AMPUTATION Right 09/25/2021   Procedure: RIGHT  TRANSMETATARSAL AMPUTATION AND APPLY TISSUE GRAFT;  Surgeon: Nadara Mustard, MD;  Location: [facility] OR;  Service: Orthopedics;  Laterality: Right;   AMPUTATION Right 11/16/2021   Procedure: RIGHT AMPUTATION BELOW KNEE WITH WOUND VAC APPLICATION;  Surgeon: Nadara Mustard, MD;  Location: [facility] OR;  Service: Orthopedics;  Laterality: Right;   BONE BIOPSY Right 03/06/2021   Procedure: BONE BIOPSY;  Surgeon: Vivi Barrack, DPM;  Location: [facility];  Service: Podiatry;  Laterality: Right;   CENTRAL VENOUS CATHETER INSERTION Right 01/20/2020  Procedure: INSERTION CENTRAL LINE ADULT; Tunneled central line;  Surgeon: Lucretia Roers, MD;  Location: [facility];  Service: General;  Laterality: Right;   COLONOSCOPY N/A 12/05/2014   [id number] external and internal hemorrhoid/mild diverticulosis/11 polyps removed   ESOPHAGOGASTRODUODENOSCOPY N/A 12/05/2014   [id number]:[id number] duodenitis   GRAFT APPLICATION Right 03/06/2021   Procedure: GRAFT APPLICATION;  Surgeon: Vivi Barrack, DPM;  Location: [facility];  Service: Podiatry;  Laterality: Right;   INCISION AND DRAINAGE OF WOUND Right 08/15/2021   Procedure: IRRIGATION AND DEBRIDEMENT WOUND;  Surgeon: Asencion Islam, DPM;  Location: [facility];  Service: Podiatry;  Laterality: Right;  need to make card not  a irrigation and debridement card   IR FLUORO GUIDE CV LINE RIGHT  03/01/2019   IR PERC TUN PERIT CATH WO PORT S&I /IMAG  06/27/2021   IR REMOVAL TUN CV CATH W/O FL  05/10/2019   IR REMOVAL TUN CV CATH W/O FL  02/29/2020   IR REMOVAL TUN CV CATH W/O FL  07/19/2021   IR US GUIDE VASC ACCESS RIGHT  03/01/2019   IR US GUIDE VASC ACCESS RIGHT  06/27/2021   IRRIGATION AND DEBRIDEMENT ELBOW Left 06/24/2021   Procedure: IRRIGATION AND DEBRIDEMENT ELBOW;  Surgeon: Lyndle Herrlich, MD;  Location: [facility] ORS;  Service: Orthopedics;  Laterality: Left;   KIDNEY TRANSPLANT  03/27/2015   Penile Pump Insertion     PERIPHERAL VASCULAR ATHERECTOMY  09/11/2021   Procedure: PERIPHERAL VASCULAR  ATHERECTOMY;  Surgeon: Iran Ouch, MD;  Location: [facility];  Service: Cardiovascular;;  Shockwave Lithotripsy   TEE WITHOUT CARDIOVERSION N/A 01/17/2020   Procedure: TRANSESOPHAGEAL ECHOCARDIOGRAM (TEE) WITH PROPOFOL;  Surgeon: Antoine Poche, MD;  Location: [facility];  Service: Endoscopy;  Laterality: N/A;   WOUND DEBRIDEMENT Right 03/06/2021   Procedure: DEBRIDEMENT WOUND;  Surgeon: Vivi Barrack, DPM;  Location: [facility];  Service: Podiatry;  Laterality: Right;   Social History   Occupational History   Not on file  Tobacco Use   Smoking status: Never   Smokeless tobacco: Never  Vaping Use   Vaping Use: Never used  Substance and Sexual Activity   Alcohol use: No    Alcohol/week: 0.0 standard drinks of alcohol   Drug use: No   Sexual activity: Yes

## 2022-11-11 ENCOUNTER — Encounter: Payer: Self-pay | Admitting: Family Medicine

## 2022-11-11 ENCOUNTER — Ambulatory Visit (INDEPENDENT_AMBULATORY_CARE_PROVIDER_SITE_OTHER): Payer: 59 | Admitting: Family Medicine

## 2022-11-11 VITALS — BP 118/82 | HR 60 | Temp 97.8°F | Ht 67.0 in | Wt 238.0 lb

## 2022-11-11 DIAGNOSIS — E6609 Other obesity due to excess calories: Secondary | ICD-10-CM | POA: Diagnosis not present

## 2022-11-11 DIAGNOSIS — Z6837 Body mass index (BMI) 37.0-37.9, adult: Secondary | ICD-10-CM

## 2022-11-11 DIAGNOSIS — R21 Rash and other nonspecific skin eruption: Secondary | ICD-10-CM

## 2022-11-11 DIAGNOSIS — L309 Dermatitis, unspecified: Secondary | ICD-10-CM

## 2022-11-11 MED ORDER — BETAMETHASONE DIPROPIONATE 0.05 % EX CREA: TOPICAL_CREAM | Freq: Two times a day (BID) | CUTANEOUS | 0 refills | Status: DC

## 2022-11-11 NOTE — Progress Notes (Unsigned)
I,Victoria T Hamilton, CMA,acting as a Neurosurgeon for [employer], NP.,have documented all relevant documentation on the behalf of Tarvares Lant, NP,as directed by  Yoni Lobos Moshe Salisbury, NP while in the presence of Daviyon Widmayer, NP.  Subjective:  Patient ID: Raymond [name] , male    DOB: [birth year] , 62 y.o.   MRN: [id number]  Chief Complaint  Patient presents with   Rash    HPI  Patient reports today for rash along with bumps on his left arm. He does Aeronautical engineer. He admits thinking this was a mosquito bite at first. He reports still experiencing itchiness along with bumps. At home he has used hydrocortisone cream which he states has not helped.Itching started on Thursday 11/06/2022. The bumps look scabbed now, no new outbreaks     Past Medical History:  Diagnosis Date   Diabetes (HCC)    Diabetes mellitus without complication (HCC)    type 2   ESRD (end stage renal disease) (HCC)    03/09/2015- patient had a kidney transplant   Hypertension    Nausea vomiting and diarrhea 12/10/2015   Peripheral vascular disease (HCC)    Renal disorder    Renal insufficiency      Family History  Problem Relation Age of Onset   Diabetes Mother    Heart disease Father    Colon cancer Neg Hx      Current Outpatient Medications:    albuterol (VENTOLIN HFA) 108 (90 Base) MCG/ACT inhaler, Inhale 2 puffs into the lungs every 6 (six) hours as needed for wheezing or shortness of breath., Disp: 8 g, Rfl: 2   atorvastatin (LIPITOR) 10 MG tablet, Take 1 tablet (10 mg total) by mouth daily., Disp: 90 tablet, Rfl: 3   betamethasone dipropionate 0.05 % cream, Apply topically 2 (two) times daily., Disp: 30 g, Rfl: 0   blood glucose meter kit and supplies KIT, Dispense based on patient and insurance preference. Use up to four times daily as directed. (FOR ICD-9 250.00, 250.01)., Disp: 1 each, Rfl: 0   Blood Glucose Monitoring Suppl (ACCU-CHEK GUIDE ME) w/Device KIT, 1 Piece by Does not apply route as directed.,  Disp: 1 kit, Rfl: 0   brimonidine (ALPHAGAN) 0.2 % ophthalmic solution, Place 1 drop into both eyes 3 (three) times daily., Disp: , Rfl:    cloNIDine (CATAPRES - DOSED IN MG/24 HR) 0.3 mg/24hr patch, Place 1 patch (0.3 mg total) onto the skin once a week. (Patient taking differently: Place 0.3 mg onto the skin once a week. Place patch on Sunday), Disp: 4 patch, Rfl: 12   Continuous Blood Gluc Receiver (DEXCOM G7 RECEIVER) DEVI, Use to check blood sugar 3 times a day. Dx code e11.65, Disp: 1 each, Rfl: 1   Continuous Blood Gluc Sensor (DEXCOM G7 SENSOR) MISC, USE TO CHECK GLUCOSE THREE TIMES DAILY AS DIRECTED, CHANGE SENSOR EVERY 10 DAYS., Disp: 3 each, Rfl: 5   docusate sodium (COLACE) 100 MG capsule, Take 1 capsule (100 mg total) by mouth daily., Disp: 10 capsule, Rfl: 0   dorzolamide-timolol (COSOPT) 22.3-6.8 MG/ML ophthalmic solution, Place 1 drop into both eyes 2 (two) times daily., Disp: , Rfl:    doxazosin (CARDURA) 4 MG tablet, Take 4 mg by mouth daily., Disp: , Rfl:    Dulaglutide (TRULICITY) 3 MG/0.5ML SOPN, Inject 3 mg as directed once a week., Disp: 6 mL, Rfl: 1   furosemide (LASIX) 20 MG tablet, 20 mg 2 (two) times daily., Disp: , Rfl:    gabapentin (NEURONTIN) 300 MG capsule,  Take 3 capsules (900 mg total) by mouth 3 (three) times daily. 3 times a day, Disp: 270 capsule, Rfl: 3   glucose blood (ACCU-CHEK GUIDE) test strip, Use as instructed 4 x daily. E11.65 (Patient taking differently: 3 (three) times daily.), Disp: 150 each, Rfl: 5   hydrALAZINE (APRESOLINE) 25 MG tablet, Take 1 tablet (25 mg total) by mouth 3 (three) times daily., Disp: 270 tablet, Rfl: 1   latanoprost (XALATAN) 0.005 % ophthalmic solution, Place 1 drop into both eyes at bedtime., Disp: , Rfl:    Multiple Vitamin (MULTIVITAMIN) capsule, Take 1 capsule by mouth daily., Disp: , Rfl:    mupirocin ointment (BACTROBAN) 2 %, Apply 1 application. topically 2 (two) times daily. For wound care, Disp: 30 g, Rfl: 0   NOVOLIN N  FLEXPEN 100 UNIT/ML FlexPen, INJECT 30 UNITS SUBCUTANEOUSLY IN THE MORNING AND 45 UNITS AT BEDTIME, Disp: 15 mL, Rfl: 0   NOVOLIN R FLEXPEN RELION 100 UNIT/ML FlexPen, INJECT 1-9 UNITS SUBCUTANEOUSLY AS DIRECTED THREE TIMES DAILY BEFORE MEALS USING INCLUDED SLIDING SCALE, Disp: 15 mL, Rfl: 0   NOVOLOG FLEXPEN 100 UNIT/ML FlexPen, INJECT 25 UNITS SUBCUTANEOUSLY BEFORE MEAL(S), Disp: 15 mL, Rfl: 0   ondansetron (ZOFRAN) 4 MG tablet, Take 1 tablet (4 mg total) by mouth every 6 (six) hours as needed for nausea., Disp: 20 tablet, Rfl: 0   sodium bicarbonate 650 MG tablet, Take 1,300 mg by mouth 3 (three) times daily., Disp: , Rfl:    sodium zirconium cyclosilicate (LOKELMA) 10 g PACK packet, Take 10 g by mouth daily., Disp: 30 packet, Rfl: 1   tacrolimus (PROGRAF) 1 MG capsule, Take 4 capsules (4 mg total) by mouth 2 (two) times daily., Disp: , Rfl:    Vitamin D, Ergocalciferol, (DRISDOL) 50000 units CAPS capsule, Take 50,000 Units by mouth See admin instructions. 15th of the month, Disp: , Rfl:   Current Facility-Administered Medications:    sodium chloride flush (NS) 0.9 % injection 3 mL, 3 mL, Intravenous, Q12H, Arida, Muhammad A, MD   Allergies  Allergen Reactions   Doxycycline     Runs up pt's blood sugar and phosphorus   Bactrim [Sulfamethoxazole-Trimethoprim]     Runs pt's sugar and phosphorus   Flagyl [Metronidazole] Rash    Patient having hives to either the vancomycin or flagyl (both infusing together).   Vancomycin Rash    Patient having hives to either vancomycin or flagyl     Review of Systems  Constitutional: Negative.   HENT: Negative.    Respiratory: Negative.    Gastrointestinal: Negative.   Endocrine: Negative.   Skin:  Positive for rash.  Allergic/Immunologic: Negative.   Neurological: Negative.   Hematological: Negative.      Today's Vitals   11/11/22 1529  BP: 118/82  Pulse: 60  Temp: 97.8 F (36.6 C)  SpO2: 98%  Weight: 238 lb (108 kg)  Height: 5\' 7"   (1.702 m)   Body mass index is 37.28 kg/m.  Wt Readings from Last 3 Encounters:  11/11/22 238 lb (108 kg)  10/16/22 240 lb (108.9 kg)  06/18/22 246 lb (111.6 kg)    The ASCVD Risk score (Arnett DK, et al., 2019) failed to calculate for the following reasons:   The valid total cholesterol range is 130 to 320 mg/dL  Objective:  Physical Exam Pulmonary:     Effort: Pulmonary effort is normal.  Musculoskeletal:     Comments: Right BKA     Right Lower Extremity: Right leg is amputated below knee.  Skin:    Findings: Rash present.  Neurological:     Mental Status: He is alert.  Psychiatric:        Mood and Affect: Mood normal.         Assessment And Plan:  1. Rash - betamethasone dipropionate 0.05 % cream; Apply topically 2 (two) times daily.  Dispense: 30 g; Refill: 0  2. Class 2 obesity due to excess calories with body mass index (BMI) of 37.0 to 37.9 in adult, unspecified whether serious comorbidity present She is encouraged to strive for BMI less than 30 to decrease cardiac risk. Advised to aim for at least 150 minutes of exercise per week.   Return if symptoms worsen or fail to improve, for keep previous appt.  Patient was given opportunity to ask questions. Patient verbalized understanding of the plan and was able to repeat key elements of the plan. All questions were answered to their satisfaction.  Shadrack Brummitt Moshe Salisbury, NP  I, Mistina Coatney Moshe Salisbury, NP, have reviewed all documentation for this visit. The documentation on 11/13/22 for the exam, diagnosis, procedures, and orders are all accurate and complete.   IF YOU HAVE BEEN REFERRED TO A SPECIALIST, IT MAY TAKE 1-2 WEEKS TO SCHEDULE/PROCESS THE REFERRAL. IF YOU HAVE NOT HEARD FROM US/SPECIALIST IN TWO WEEKS, PLEASE GIVE [place] A CALL AT [phone].

## 2022-11-13 ENCOUNTER — Encounter: Payer: Self-pay | Admitting: Family Medicine

## 2022-11-13 DIAGNOSIS — E6609 Other obesity due to excess calories: Secondary | ICD-10-CM | POA: Insufficient documentation

## 2022-11-13 DIAGNOSIS — E66812 Obesity, class 2: Secondary | ICD-10-CM | POA: Insufficient documentation

## 2022-11-13 DIAGNOSIS — L309 Dermatitis, unspecified: Secondary | ICD-10-CM | POA: Insufficient documentation

## 2022-11-13 DIAGNOSIS — R21 Rash and other nonspecific skin eruption: Secondary | ICD-10-CM | POA: Insufficient documentation

## 2022-11-13 NOTE — Assessment & Plan Note (Signed)
Apply cream to affected areas for 5 days as needed

## 2022-12-02 ENCOUNTER — Other Ambulatory Visit: Payer: Self-pay | Admitting: Endocrinology

## 2022-12-02 DIAGNOSIS — E1165 Type 2 diabetes mellitus with hyperglycemia: Secondary | ICD-10-CM

## 2022-12-20 ENCOUNTER — Other Ambulatory Visit: Payer: Self-pay | Admitting: Endocrinology

## 2022-12-20 ENCOUNTER — Other Ambulatory Visit: Payer: Self-pay | Admitting: Nurse Practitioner

## 2022-12-20 DIAGNOSIS — E1165 Type 2 diabetes mellitus with hyperglycemia: Secondary | ICD-10-CM

## 2023-01-06 ENCOUNTER — Telehealth: Payer: Self-pay | Admitting: Orthopedic Surgery

## 2023-01-06 NOTE — Telephone Encounter (Signed)
Patient came in and said he needs a prescription for his leg. [phone] Patient also said he spoke to chris.

## 2023-01-07 NOTE — Telephone Encounter (Signed)
[facility] written. When I get back to my desk, I will fax the Rx to [facility].

## 2023-01-07 NOTE — Telephone Encounter (Signed)
Rx sent. [facility] will contact patient to schedule appt with them.

## 2023-01-24 ENCOUNTER — Other Ambulatory Visit: Payer: Self-pay

## 2023-01-24 ENCOUNTER — Emergency Department (HOSPITAL_COMMUNITY): Payer: 59

## 2023-01-24 ENCOUNTER — Emergency Department (HOSPITAL_COMMUNITY)
Admission: EM | Admit: 2023-01-24 | Discharge: 2023-01-25 | Disposition: A | Discharge status: Home / Self Care | Payer: 59 | Attending: Emergency Medicine | Admitting: Emergency Medicine

## 2023-01-24 ENCOUNTER — Encounter (HOSPITAL_COMMUNITY): Payer: Self-pay

## 2023-01-24 DIAGNOSIS — Z940 Kidney transplant status: Secondary | ICD-10-CM

## 2023-01-24 DIAGNOSIS — R42 Dizziness and giddiness: Secondary | ICD-10-CM | POA: Insufficient documentation

## 2023-01-24 DIAGNOSIS — R140 Abdominal distension (gaseous): Secondary | ICD-10-CM | POA: Diagnosis present

## 2023-01-24 DIAGNOSIS — Z794 Long term (current) use of insulin: Secondary | ICD-10-CM | POA: Diagnosis not present

## 2023-01-24 DIAGNOSIS — Z992 Dependence on renal dialysis: Secondary | ICD-10-CM | POA: Insufficient documentation

## 2023-01-24 DIAGNOSIS — E1122 Type 2 diabetes mellitus with diabetic chronic kidney disease: Secondary | ICD-10-CM | POA: Diagnosis not present

## 2023-01-24 DIAGNOSIS — I120 Hypertensive chronic kidney disease with stage 5 chronic kidney disease or end stage renal disease: Secondary | ICD-10-CM | POA: Diagnosis not present

## 2023-01-24 DIAGNOSIS — N186 End stage renal disease: Secondary | ICD-10-CM | POA: Diagnosis not present

## 2023-01-24 DIAGNOSIS — Z79899 Other long term (current) drug therapy: Secondary | ICD-10-CM | POA: Insufficient documentation

## 2023-01-24 LAB — CBC WITH DIFFERENTIAL/PLATELET
Abs Immature Granulocytes: 0.04 10*3/uL (ref 0.00–0.07)
Basophils Absolute: 0 10*3/uL (ref 0.0–0.1)
Basophils Relative: 0 %
Eosinophils Absolute: 0 10*3/uL (ref 0.0–0.5)
Eosinophils Relative: 0 %
HCT: 41.4 % (ref 39.0–52.0)
Hemoglobin: 12.9 g/dL — ABNORMAL LOW (ref 13.0–17.0)
Immature Granulocytes: 0 %
Lymphocytes Relative: 13 %
Lymphs Abs: 1.2 10*3/uL (ref 0.7–4.0)
MCH: 25 pg — ABNORMAL LOW (ref 26.0–34.0)
MCHC: 31.2 g/dL (ref 30.0–36.0)
MCV: 80.2 fL (ref 80.0–100.0)
Monocytes Absolute: 0.9 10*3/uL (ref 0.1–1.0)
Monocytes Relative: 10 %
Neutro Abs: 7.2 10*3/uL (ref 1.7–7.7)
Neutrophils Relative %: 77 %
Platelets: 112 10*3/uL — ABNORMAL LOW (ref 150–400)
RBC: 5.16 MIL/uL (ref 4.22–5.81)
RDW: 14.5 % (ref 11.5–15.5)
WBC: 9.4 10*3/uL (ref 4.0–10.5)
nRBC: 0 % (ref 0.0–0.2)

## 2023-01-24 LAB — COMPREHENSIVE METABOLIC PANEL
ALT: 17 U/L (ref 0–44)
AST: 14 U/L — ABNORMAL LOW (ref 15–41)
Albumin: 3.1 g/dL — ABNORMAL LOW (ref 3.5–5.0)
Alkaline Phosphatase: 103 U/L (ref 38–126)
Anion gap: 9 (ref 5–15)
BUN: 32 mg/dL — ABNORMAL HIGH (ref 8–23)
CO2: 21 mmol/L — ABNORMAL LOW (ref 22–32)
Calcium: 8.9 mg/dL (ref 8.9–10.3)
Chloride: 106 mmol/L (ref 98–111)
Creatinine, Ser: 1.32 mg/dL — ABNORMAL HIGH (ref 0.61–1.24)
GFR, Estimated: >60 mL/min (ref >60)
Glucose, Bld: 96 mg/dL (ref 70–99)
Potassium: 3.8 mmol/L (ref 3.5–5.1)
Sodium: 136 mmol/L (ref 135–145)
Total Bilirubin: 1.4 mg/dL — ABNORMAL HIGH (ref 0.3–1.2)
Total Protein: 7.3 g/dL (ref 6.5–8.1)

## 2023-01-24 LAB — LIPASE, BLOOD: Lipase: 16 U/L (ref 11–51)

## 2023-01-24 MED ORDER — IOHEXOL 300 MG/ML  SOLN
100.0000 mL | Freq: Once | INTRAMUSCULAR | Status: AC | PRN
  Administered 2023-01-24: 100 mL via INTRAVENOUS

## 2023-01-24 NOTE — ED Triage Notes (Signed)
Bloated x2 months Decreased app  Denies pain, n/v/d  Pt stated he ABD feels big and round

## 2023-01-24 NOTE — ED Provider Notes (Signed)
Care assumed from Dr. Deretha Emory, Raymond [name] with abdominal distension pending report of CT abdomen/pelvis.  CT reading is no acute process.  Have independently viewed the images, and agree with the radiologist's interpretation.  Incidental finding of cholelithiasis and umbilical hernia.  I am discharging the Raymond [name] with instructions to follow-up with his primary care provider and renal transplant team.  Results for orders placed or performed during the hospital encounter of 01/24/23  CBC with Differential/Platelet  Result Value Ref Range   WBC 9.4 4.0 - 10.5 K/uL   RBC 5.16 4.22 - 5.81 MIL/uL   Hemoglobin 12.9 (L) 13.0 - 17.0 g/dL   HCT 78.2 95.6 - 21.3 %   MCV 80.2 80.0 - 100.0 fL   MCH 25.0 (L) 26.0 - 34.0 pg   MCHC 31.2 30.0 - 36.0 g/dL   RDW 08.6 57.8 - 46.9 %   Platelets 112 (L) 150 - 400 K/uL   nRBC 0.0 0.0 - 0.2 %   Neutrophils Relative % 77 %   Neutro Abs 7.2 1.7 - 7.7 K/uL   Lymphocytes Relative 13 %   Lymphs Abs 1.2 0.7 - 4.0 K/uL   Monocytes Relative 10 %   Monocytes Absolute 0.9 0.1 - 1.0 K/uL   Eosinophils Relative 0 %   Eosinophils Absolute 0.0 0.0 - 0.5 K/uL   Basophils Relative 0 %   Basophils Absolute 0.0 0.0 - 0.1 K/uL   Immature Granulocytes 0 %   Abs Immature Granulocytes 0.04 0.00 - 0.07 K/uL  Comprehensive metabolic panel  Result Value Ref Range   Sodium 136 135 - 145 mmol/L   Potassium 3.8 3.5 - 5.1 mmol/L   Chloride 106 98 - 111 mmol/L   CO2 21 (L) 22 - 32 mmol/L   Glucose, Bld 96 70 - 99 mg/dL   BUN 32 (H) 8 - 23 mg/dL   Creatinine, Ser 6.29 (H) 0.61 - 1.24 mg/dL   Calcium 8.9 8.9 - 52.8 mg/dL   Total Protein 7.3 6.5 - 8.1 g/dL   Albumin 3.1 (L) 3.5 - 5.0 g/dL   AST 14 (L) 15 - 41 U/L   ALT 17 0 - 44 U/L   Alkaline Phosphatase 103 38 - 126 U/L   Total Bilirubin 1.4 (H) 0.3 - 1.2 mg/dL   GFR, Estimated >41 >32 mL/min   Anion gap 9 5 - 15  Lipase, blood  Result Value Ref Range   Lipase 16 11 - 51 U/L   CT ABDOMEN PELVIS W CONTRAST  Result  Date: 01/24/2023 CLINICAL DATA:  Abdominal pain, bloating for 2 months, decreased appetite EXAM: CT ABDOMEN AND PELVIS WITH CONTRAST TECHNIQUE: Multidetector CT imaging of the abdomen and pelvis was performed using the standard protocol following bolus administration of intravenous contrast. RADIATION DOSE REDUCTION: This exam was performed according to the departmental dose-optimization program which includes automated exposure control, adjustment of the mA and/or kV according to Raymond [name] size and/or use of iterative reconstruction technique. CONTRAST:  OMNIPAQUE IOHEXOL 300 MG/ML  SOLN COMPARISON:  07/10/2022 FINDINGS: Lower chest: No acute pleural or parenchymal lung disease. Hepatobiliary: Calcified gallstones without evidence of acute cholecystitis. The liver is unremarkable. No biliary duct dilation. Pancreas: Fatty atrophy of the pancreas again noted. No acute inflammatory change. Spleen: Normal in size without focal abnormality. Adrenals/Urinary Tract: Stable right adrenal myelolipoma. Left adrenal is unremarkable. There is atrophy of the bilateral native kidneys. Stable 1.9 cm right renal cyst does not require imaging follow-up. There is a transplant kidney within the right lower  quadrant, demonstrating normal parenchymal enhancement. Chronic stranding within the perinephric fat right lower quadrant likely postsurgical. No hydronephrosis or nephrolithiasis. The bladder is moderately distended, without focal abnormality. Stomach/Bowel: No bowel obstruction or ileus. Normal appendix right lower quadrant. No bowel wall thickening or inflammatory change. Vascular/Lymphatic: Aortic atherosclerosis. No enlarged abdominal or pelvic lymph nodes. Reproductive: Prostate is unremarkable. Penile prosthesis, with reservoir in the right lower pelvis unchanged. Other: No free fluid or free intraperitoneal gas. Stable small fat containing umbilical hernia. No bowel herniation. Musculoskeletal: No acute or destructive  bony abnormalities. Reconstructed images demonstrate no additional findings. IMPRESSION: 1. Right lower quadrant transplant kidney without significant change since prior exam. 2. Cholelithiasis without evidence of acute cholecystitis. 3. Stable small fat containing umbilical hernia. No bowel herniation. 4.  Aortic Atherosclerosis (ICD10-I70.0). Electronically Signed   By: Sharlet Salina M.D.   On: 01/24/2023 23:56      Dione Booze, MD 01/25/23 [phone]

## 2023-01-24 NOTE — ED Provider Notes (Addendum)
[place] [facility]ERGENCY DEPARTMENT AT [facility] Provider Note   CSN: [id number] Arrival date & time: 01/24/23  1837     History  Chief Complaint  Patient presents with   Bloated    Raymond [name] is a 62 y.o. male.  Patient is a transplant patient from 2016.  Was done down in [place].  Patient talks about abdominal distention and bloating for at least 2 months family says it may have been longer.  Associate with little bit decreased appetite no pain at all no nausea vomiting or diarrhea earlier today he felt a little lightheaded got a little diaphoretic there is no chest pain or any shortness of breath.  He feels completely fine now.  Past medical history significant for hypertension diabetes end-stage renal disease with kidney transplant in 2016.  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  Patient is never used tobacco products.  Vital signs here temp 99.8 pulse 70 respiration 618 blood pressure 173/85 oxygen saturation is 95%.  Patient denies any upper respiratory symptoms.  No headache.  No myalgias.       Home Medications Prior to Admission medications   Medication Sig Start Date End Date Taking? Authorizing Provider  albuterol (VENTOLIN HFA) 108 (90 Base) MCG/ACT inhaler Inhale 2 puffs into the lungs every 6 (six) hours as needed for wheezing or shortness of breath. 10/16/22   [name], FNP  atorvastatin (LIPITOR) 10 MG tablet Take 1 tablet (10 mg total) by mouth daily. 05/13/22   Runell Gess, MD  betamethasone dipropionate 0.05 % cream Apply topically 2 (two) times daily. 11/11/22   Ellender Hose, NP  blood glucose meter kit and supplies KIT Dispense based on patient and insurance preference. Use up to four times daily as directed. (FOR ICD-9 250.00, 250.01). 04/11/20   Roma Kayser, MD  Blood Glucose Monitoring Suppl (ACCU-CHEK GUIDE ME) w/Device KIT 1 Piece by Does not apply route as directed. 02/18/18   Roma Kayser, MD  brimonidine (ALPHAGAN) 0.2 % ophthalmic solution Place 1 drop into both eyes 3 (three) times daily. 07/25/21   [provider]  cloNIDine (CATAPRES - DOSED IN MG/24 HR) 0.3 mg/24hr patch Place 1 patch (0.3 mg total) onto the skin once a week. Patient taking differently: Place 0.3 mg onto the skin once a week. Place patch on Sunday 08/21/21   Runell Gess, MD  Continuous Blood Gluc Receiver (DEXCOM G7 RECEIVER) DEVI Use to check blood sugar 3 times a day. Dx code e11.65 12/16/21   [name], FNP  Continuous Blood Gluc Sensor (DEXCOM G7 SENSOR) MISC USE TO CHECK GLUCOSE THREE TIMES DAILY AS DIRECTED, CHANGE SENSOR EVERY 10 DAYS. 07/15/22   [name], FNP  docusate sodium (COLACE) 100 MG capsule Take 1 capsule (100 mg total) by mouth daily. 11/20/21   Pokhrel, Rebekah Chesterfield, MD  dorzolamide-timolol (COSOPT) 22.3-6.8 MG/ML ophthalmic solution Place 1 drop into both eyes 2 (two) times daily. 07/25/21   [provider]  doxazosin (CARDURA) 4 MG tablet Take 4 mg by mouth daily. 12/18/15   [provider]  Dulaglutide (TRULICITY) 3 MG/0.5ML SOPN Inject 3 mg as directed once a week. 03/03/22   Reather Littler, MD  furosemide (LASIX) 20 MG tablet 20 mg 2 (two) times daily. 04/23/22   [provider]  gabapentin (NEURONTIN) 300 MG capsule Take 3 capsules (900 mg total) by mouth 3 (three) times daily. 3 times a day 11/22/21   Barnie Del  R, NP  glucose blood (ACCU-CHEK GUIDE) test strip Use as instructed 4 x daily. E11.65 Patient taking differently: 3 (three) times daily. 02/19/18   Roma Kayser, MD  hydrALAZINE (APRESOLINE) 25 MG tablet Take 1 tablet (25 mg total) by mouth 3 (three) times daily. 02/05/22   [name], FNP  latanoprost (XALATAN) 0.005 % ophthalmic solution Place 1 drop into both eyes at bedtime. 07/25/21   [provider]  Multiple Vitamin (MULTIVITAMIN) capsule Take 1 capsule by mouth daily.    [provider]   mupirocin ointment (BACTROBAN) 2 % Apply 1 application. topically 2 (two) times daily. For wound care 09/12/21   Asencion Islam, DPM  NOVOLIN N FLEXPEN 100 UNIT/ML FlexPen INJECT 30 UNITS SUBCUTANEOUSLY IN THE MORNING AND 45 UNITS AT BEDTIME 12/22/22   Reather Littler, MD  NOVOLIN R FLEXPEN RELION 100 UNIT/ML FlexPen INJECT 1-9 UNITS SUBCUTANEOUSLY AS DIRECTED THREE TIMES DAILY BEFORE MEALS USING INCLUDED SLIDING SCALE 12/23/22   [name], FNP  NOVOLOG FLEXPEN 100 UNIT/ML FlexPen INJECT 25 UNITS SUBCUTANEOUSLY BEFORE MEAL(S) 12/22/22   Reather Littler, MD  ondansetron (ZOFRAN) 4 MG tablet Take 1 tablet (4 mg total) by mouth every 6 (six) hours as needed for nausea. 11/19/21   Pokhrel, Rebekah Chesterfield, MD  sodium bicarbonate 650 MG tablet Take 1,300 mg by mouth 3 (three) times daily. 11/02/18   [provider]  sodium zirconium cyclosilicate (LOKELMA) 10 g PACK packet Take 10 g by mouth daily. 08/30/21   Rai, Delene Ruffini, MD  tacrolimus (PROGRAF) 1 MG capsule Take 4 capsules (4 mg total) by mouth 2 (two) times daily. 08/30/21   Rai, Delene Ruffini, MD  Vitamin D, Ergocalciferol, (DRISDOL) 50000 units CAPS capsule Take 50,000 Units by mouth See admin instructions. 15th of the month    [provider]      Allergies    Doxycycline, Bactrim [sulfamethoxazole-trimethoprim], Flagyl [metronidazole], and Vancomycin    Review of Systems   Review of Systems  Constitutional:  Positive for diaphoresis. Negative for chills and fever.  HENT:  Negative for ear pain and sore throat.   Eyes:  Negative for pain and visual disturbance.  Respiratory:  Negative for cough and shortness of breath.   Cardiovascular:  Negative for chest pain and palpitations.  Gastrointestinal:  Positive for abdominal distention. Negative for abdominal pain and vomiting.  Genitourinary:  Negative for dysuria and hematuria.  Musculoskeletal:  Negative for arthralgias and back pain.  Skin:  Negative for color change and rash.  Neurological:   Positive for light-headedness. Negative for seizures and syncope.  All other systems reviewed and are negative.   Physical Exam Updated Vital Signs BP (!) 161/84   Pulse 63   Temp 99.8 F (37.7 C) (Oral)   Resp 18   Ht 1.702 m (5\' 7" )   Wt 109.8 kg   SpO2 93%   BMI 37.90 kg/m  Physical Exam Vitals and nursing note reviewed.  Constitutional:      General: He is not in acute distress.    Appearance: Normal appearance. He is well-developed. He is not ill-appearing.  HENT:     Head: Normocephalic and atraumatic.     Mouth/Throat:     Mouth: Mucous membranes are moist.  Eyes:     Extraocular Movements: Extraocular movements intact.     Conjunctiva/sclera: Conjunctivae normal.     Pupils: Pupils are equal, round, and reactive to light.  Cardiovascular:     Rate and Rhythm: Normal rate and regular rhythm.  Heart sounds: No murmur heard. Pulmonary:     Effort: Pulmonary effort is normal. No respiratory distress.     Breath sounds: Normal breath sounds. No wheezing, rhonchi or rales.  Abdominal:     General: There is distension.     Palpations: Abdomen is soft.     Tenderness: There is no abdominal tenderness. There is no guarding.  Musculoskeletal:        General: No swelling.     Cervical back: Normal range of motion and neck supple.     Right lower leg: No edema.     Left lower leg: No edema.     Comments: Evidence of old graft to left upper extremity.  Skin:    General: Skin is warm and dry.     Capillary Refill: Capillary refill takes less than 2 seconds.  Neurological:     General: No focal deficit present.     Mental Status: He is alert and oriented to person, place, and time.  Psychiatric:        Mood and Affect: Mood normal.     ED Results / Procedures / Treatments   Labs (all labs ordered are listed, but only abnormal results are displayed) Labs Reviewed  CBC WITH DIFFERENTIAL/PLATELET - Abnormal; Notable for the following components:      Result Value    Hemoglobin 12.9 (*)    MCH 25.0 (*)    Platelets 112 (*)    All other components within normal limits  COMPREHENSIVE METABOLIC PANEL - Abnormal; Notable for the following components:   CO2 21 (*)    BUN 32 (*)    Creatinine, Ser 1.32 (*)    Albumin 3.1 (*)    AST 14 (*)    Total Bilirubin 1.4 (*)    All other components within normal limits  LIPASE, BLOOD    EKG None  Radiology No results found.  Procedures Procedures    Medications Ordered in ED Medications  iohexol (OMNIPAQUE) 300 MG/ML solution 100 mL (has no administration in time range)    ED Course/ Medical Decision Making/ A&P                                 Medical Decision Making Amount and/or Complexity of Data Reviewed Labs: ordered. Radiology: ordered.  Risk Prescription drug management.   CBC no leukocytosis hemoglobin 12.9.  Platelets 112.  Complete metabolic panel electrolytes normal CO2 down a little bit of 21 creatinine 1.32 for GFR greater than 60 with very reassuring.  Liver function test total bili up a little bit at 1.4.  Anion gap normal lipase normal.  Marked abdominal distention without any tenderness at all.  Based on this although it seems like a somewhat chronic we will go ahead and get CT scan of the abdomen and pelvis.  Formal CT results pending.  But does look like it is abdominal ascites.   Final Clinical Impression(s) / ED Diagnoses Final diagnoses:  Abdominal distention  History of renal transplant    Rx / DC Orders ED Discharge Orders     None         Vanetta Mulders, MD 01/24/23 2244    Vanetta Mulders, MD 01/24/23 2330

## 2023-01-24 NOTE — ED Notes (Signed)
ED Provider at bedside. 

## 2023-01-25 NOTE — Discharge Instructions (Addendum)
Your CT scan did not show any signs of a bowel obstruction or other serious problem.  Please follow-up with your primary care provider, return to the emergency department if symptoms are worsening.

## 2023-01-27 ENCOUNTER — Ambulatory Visit (INDEPENDENT_AMBULATORY_CARE_PROVIDER_SITE_OTHER): Payer: 59 | Admitting: Endocrinology

## 2023-01-27 ENCOUNTER — Encounter: Payer: Self-pay | Admitting: Endocrinology

## 2023-01-27 ENCOUNTER — Telehealth: Payer: Self-pay | Admitting: Endocrinology

## 2023-01-27 ENCOUNTER — Telehealth: Payer: Self-pay

## 2023-01-27 VITALS — BP 130/70 | HR 64 | Ht 67.0 in | Wt 238.0 lb

## 2023-01-27 DIAGNOSIS — E1165 Type 2 diabetes mellitus with hyperglycemia: Secondary | ICD-10-CM

## 2023-01-27 DIAGNOSIS — Z794 Long term (current) use of insulin: Secondary | ICD-10-CM | POA: Diagnosis not present

## 2023-01-27 DIAGNOSIS — Z89511 Acquired absence of right leg below knee: Secondary | ICD-10-CM | POA: Diagnosis not present

## 2023-01-27 LAB — GLUCOSE, POCT (MANUAL RESULT ENTRY): POC Glucose: 187 mg/dL — AB (ref 70–99)

## 2023-01-27 LAB — HEMOGLOBIN A1C: Hgb A1c MFr Bld: 8.4 % — ABNORMAL HIGH (ref 4.6–6.5)

## 2023-01-27 MED ORDER — TRULICITY 3 MG/0.5ML ~~LOC~~ SOAJ: 3.0000 mg | SUBCUTANEOUS | 4 refills | Status: DC

## 2023-01-27 MED ORDER — NOVOLIN N FLEXPEN 100 UNIT/ML ~~LOC~~ SUPN: PEN_INJECTOR | SUBCUTANEOUS | 3 refills | Status: DC

## 2023-01-27 MED ORDER — NOVOLOG FLEXPEN 100 UNIT/ML ~~LOC~~ SOPN: PEN_INJECTOR | SUBCUTANEOUS | 3 refills | Status: DC

## 2023-01-27 NOTE — Transitions of Care (Post Inpatient/ED Visit) (Signed)
   01/27/2023  Name: Raymond [name] MRN: [id number] DOB: 08/04/1960  Today's TOC FU Call Status: Today's TOC FU Call Status:: Unsuccessful Call (2nd Attempt) Unsuccessful Call (2nd Attempt) Date: 01/27/23  Attempted to reach the patient regarding the most recent Inpatient/ED visit.  Follow Up Plan: Additional outreach attempts will be made to reach the patient to complete the Transitions of Care (Post Inpatient/ED visit) call.   Signature YL,RMA

## 2023-01-27 NOTE — Telephone Encounter (Signed)
Also I went over the medications with him and He has not been on Trulicity or Novolin R flex pen

## 2023-01-27 NOTE — Transitions of Care (Post Inpatient/ED Visit) (Signed)
   01/27/2023  Name: Bowan Fowle MRN: [id number] DOB: February 04, 1961  Today's TOC FU Call Status: Today's TOC FU Call Status:: Unsuccessful Call (1st Attempt) Unsuccessful Call (1st Attempt) Date: 01/27/23  Attempted to reach the patient regarding the most recent Inpatient/ED visit.  Follow Up Plan: Additional outreach attempts will be made to reach the patient to complete the Transitions of Care (Post Inpatient/ED visit) call.   Signature  Lisabeth Devoid, [place]

## 2023-01-27 NOTE — Progress Notes (Signed)
Outpatient Endocrinology Note [place] Adhvik Canady, MD  01/28/23  Patient's Name: Raymond [name]    DOB: [birth year]    MRN: [id number]                                                    REASON OF VISIT: Follow up for type 2 diabetes mellitus  PCP: [name], FNP  HISTORY OF PRESENT ILLNESS:   Kohlson Steffensmeier is a 62 y.o. old male with past medical history listed below, is here for follow up of type 2 diabetes mellitus.  Patient was last seen by Dr. Lucianne Muss in December 2023.  Pertinent Diabetes History: Patient was diagnosed with type 2 diabetes mellitus in 2002.  Insulin therapy was started in 2018.  He has uncontrolled diabetes mellitus hemoglobin A1c in the range of 7.1 to around 14%.  Chronic Diabetes Complications : Retinopathy: unknown. Last ophthalmology exam was done on Due. Nephropathy: Microalbuminuria present, following with nephrology. Peripheral neuropathy: yes, on gabapentin.  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 Coronary artery disease: no Stroke: no  Relevant comorbidities and cardiovascular risk factors: Obesity: yes Body mass index is 37.28 kg/m.  Hypertension: yes Hyperlipidemia. Yes, on statin  Current / Home Diabetic regimen includes: Novolin N 30 units 2 times a day. Novolin R as needed and NovoLog as needed has not been using for several weeks. Trulicity 3 mg weekly, ran out about 3 weeks ago  Prior diabetic medications: Glipizide in the past.  Glycemic data:   No glucose data to review.  He reports he is having variable blood sugar with hyperglycemia around midnight up to 300s and hypoglycemia in the morning sometime up to 50s.  Not able to download/connect his Dexcom G7 in the clinic today.  Hypoglycemia: Patient has hypoglycemic episodes. Patient has hypoglycemia awareness/  Factors modifying glucose control: 1.  Diabetic diet assessment: 3 meals a day and he reports frequent snacking.  Dinner around 8 PM.  2.  Staying active or  exercising: None  3.  Medication compliance: compliant most of the time.  Interval history 01/28/23 Patient presented with a concern of variable blood sugar with hypo and hyperglycemia.  He did not have appointment however he walked in.  He complains of feeling weak.  Denies fever or any other illness.  He ran out of Trulicity about 3 weeks ago.  He is not using regular insulin and also NovoLog, not using.  However he is only using Novolin N 30 units 2 times a day in the morning and at bedtime.  No glucose data to review.  REVIEW OF SYSTEMS As per history of present illness.   PAST MEDICAL HISTORY: Past Medical History:  Diagnosis Date   Diabetes (HCC)    Diabetes mellitus without complication (HCC)    type 2   ESRD (end stage renal disease) (HCC)    03/09/2015- patient had a kidney transplant   Hypertension    Nausea vomiting and diarrhea 12/10/2015   Peripheral vascular disease (HCC)    Renal disorder    Renal insufficiency     PAST SURGICAL HISTORY: Past Surgical History:  Procedure Laterality Date   ABDOMINAL AORTOGRAM W/LOWER EXTREMITY N/A 09/11/2021   Procedure: ABDOMINAL AORTOGRAM W/LOWER EXTREMITY;  Surgeon: Iran Ouch, MD;  [facility];  Service: Cardiovascular;  Laterality: N/A;   AMPUTATION  Right 09/25/2021   Procedure: RIGHT TRANSMETATARSAL AMPUTATION AND APPLY TISSUE GRAFT;  Surgeon: Nadara Mustard, MD;  [facility];  Service: Orthopedics;  Laterality: Right;   AMPUTATION Right 11/16/2021   Procedure: RIGHT AMPUTATION BELOW KNEE WITH WOUND VAC APPLICATION;  Surgeon: Nadara Mustard, MD;  Location: [facility];  Service: Orthopedics;  Laterality: Right;   BONE BIOPSY Right 03/06/2021   Procedure: BONE BIOPSY;  Surgeon: Vivi Barrack, DPM;  Location: [facility];  Service: Podiatry;  Laterality: Right;   CENTRAL VENOUS CATHETER INSERTION Right 01/20/2020   Procedure: INSERTION CENTRAL LINE ADULT; Tunneled central line;  Surgeon: Lucretia Roers,  MD;  Location: [facility];  Service: General;  Laterality: Right;   COLONOSCOPY N/A 12/05/2014   GNF:AOZHYQMV external and internal hemorrhoid/mild diverticulosis/11 polyps removed   ESOPHAGOGASTRODUODENOSCOPY N/A 12/05/2014   HQI:ONGE duodenitis   GRAFT APPLICATION Right 03/06/2021   Procedure: GRAFT APPLICATION;  Surgeon: Vivi Barrack, DPM;  Location: [facility];  Service: Podiatry;  Laterality: Right;   INCISION AND DRAINAGE OF WOUND Right 08/15/2021   Procedure: IRRIGATION AND DEBRIDEMENT WOUND;  Surgeon: Asencion Islam, DPM;  Location: [facility];  Service: Podiatry;  Laterality: Right;  need to make card not  a irrigation and debridement card   IR FLUORO GUIDE CV LINE RIGHT  03/01/2019   IR PERC TUN PERIT CATH WO PORT S&I /IMAG  06/27/2021   IR REMOVAL TUN CV CATH W/O FL  05/10/2019   IR REMOVAL TUN CV CATH W/O FL  02/29/2020   IR REMOVAL TUN CV CATH W/O FL  07/19/2021   IR US GUIDE VASC ACCESS RIGHT  03/01/2019   IR US GUIDE VASC ACCESS RIGHT  06/27/2021   IRRIGATION AND DEBRIDEMENT ELBOW Left 06/24/2021   Procedure: IRRIGATION AND DEBRIDEMENT ELBOW;  Surgeon: Lyndle Herrlich, MD;  Location: [facility] ORS;  Service: Orthopedics;  Laterality: Left;   KIDNEY TRANSPLANT  03/27/2015   Penile Pump Insertion     PERIPHERAL VASCULAR ATHERECTOMY  09/11/2021   Procedure: PERIPHERAL VASCULAR ATHERECTOMY;  Surgeon: Iran Ouch, MD;  [facility];  Service: Cardiovascular;;  Shockwave Lithotripsy   TEE WITHOUT CARDIOVERSION N/A 01/17/2020   Procedure: TRANSESOPHAGEAL ECHOCARDIOGRAM (TEE) WITH PROPOFOL;  Surgeon: Antoine Poche, MD;  [facility];  Service: Endoscopy;  Laterality: N/A;   WOUND DEBRIDEMENT Right 03/06/2021   Procedure: DEBRIDEMENT WOUND;  Surgeon: Vivi Barrack, DPM;  Location: [facility];  Service: Podiatry;  Laterality: Right;    ALLERGIES: Allergies  Allergen Reactions   Doxycycline     Runs up pt's blood sugar and phosphorus   Bactrim  [Sulfamethoxazole-Trimethoprim]     Runs pt's sugar and phosphorus   Flagyl [Metronidazole] Rash    Patient having hives to either the vancomycin or flagyl (both infusing together).   Vancomycin Rash    Patient having hives to either vancomycin or flagyl    FAMILY HISTORY:  Family History  Problem Relation Age of Onset   Diabetes Mother    Heart disease Father    Colon cancer Neg Hx     SOCIAL HISTORY: Social History   Socioeconomic History   Marital status: Married    Spouse name: Not on file   Number of children: Not on file   Years of education: Not on file   Highest education level: Not on file  Occupational History   Not on file  Tobacco Use   Smoking status: Never   Smokeless tobacco: Never  Vaping  Use   Vaping status: Never Used  Substance and Sexual Activity   Alcohol use: No    Alcohol/week: 0.0 standard drinks of alcohol   Drug use: No   Sexual activity: Yes  Other Topics Concern   Not on file  Social History Narrative   ** Merged History Encounter **       Social Determinants of Health   Financial Resource Strain: Not on file  Food Insecurity: Not on file  Transportation Needs: Not on file  Physical Activity: Not on file  Stress: Not on file  Social Connections: Not on file    MEDICATIONS:  Current Outpatient Medications  Medication Sig Dispense Refill   albuterol (VENTOLIN HFA) 108 (90 Base) MCG/ACT inhaler Inhale 2 puffs into the lungs every 6 (six) hours as needed for wheezing or shortness of breath. 8 g 2   atorvastatin (LIPITOR) 10 MG tablet Take 1 tablet (10 mg total) by mouth daily. 90 tablet 3   betamethasone dipropionate 0.05 % cream Apply topically 2 (two) times daily. 30 g 0   blood glucose meter kit and supplies KIT Dispense based on patient and insurance preference. Use up to four times daily as directed. (FOR ICD-9 250.00, 250.01). 1 each 0   Blood Glucose Monitoring Suppl (ACCU-CHEK GUIDE ME) w/Device KIT 1 Piece by Does not apply  route as directed. 1 kit 0   brimonidine (ALPHAGAN) 0.2 % ophthalmic solution Place 1 drop into both eyes 3 (three) times daily.     cloNIDine (CATAPRES - DOSED IN MG/24 HR) 0.3 mg/24hr patch Place 1 patch (0.3 mg total) onto the skin once a week. (Patient taking differently: Place 0.3 mg onto the skin once a week. Place patch on Sunday) 4 patch 12   Continuous Blood Gluc Receiver (DEXCOM G7 RECEIVER) DEVI Use to check blood sugar 3 times a day. Dx code e11.65 1 each 1   Continuous Blood Gluc Sensor (DEXCOM G7 SENSOR) MISC USE TO CHECK GLUCOSE THREE TIMES DAILY AS DIRECTED, CHANGE SENSOR EVERY 10 DAYS. 3 each 5   dorzolamide-timolol (COSOPT) 22.3-6.8 MG/ML ophthalmic solution Place 1 drop into both eyes 2 (two) times daily.     doxazosin (CARDURA) 4 MG tablet Take 4 mg by mouth daily.     furosemide (LASIX) 20 MG tablet 20 mg 2 (two) times daily.     gabapentin (NEURONTIN) 300 MG capsule Take 3 capsules (900 mg total) by mouth 3 (three) times daily. 3 times a day 270 capsule 3   hydrALAZINE (APRESOLINE) 25 MG tablet Take 1 tablet (25 mg total) by mouth 3 (three) times daily. 270 tablet 1   latanoprost (XALATAN) 0.005 % ophthalmic solution Place 1 drop into both eyes at bedtime.     Multiple Vitamin (MULTIVITAMIN) capsule Take 1 capsule by mouth daily.     mupirocin ointment (BACTROBAN) 2 % Apply 1 application. topically 2 (two) times daily. For wound care 30 g 0   sodium bicarbonate 650 MG tablet Take 1,300 mg by mouth 3 (three) times daily.     tacrolimus (PROGRAF) 1 MG capsule Take 4 capsules (4 mg total) by mouth 2 (two) times daily.     Vitamin D, Ergocalciferol, (DRISDOL) 50000 units CAPS capsule Take 50,000 Units by mouth See admin instructions. 15th of the month     docusate sodium (COLACE) 100 MG capsule Take 1 capsule (100 mg total) by mouth daily. (Patient not taking: Reported on 01/27/2023) 10 capsule 0   Dulaglutide (TRULICITY) 3 MG/0.5ML SOPN Inject  3 mg as directed once a week. 6 mL 4    glucose blood (ACCU-CHEK GUIDE) test strip Use as instructed 4 x daily. E11.65 (Patient not taking: Reported on 01/27/2023) 150 each 5   NOVOLIN N FLEXPEN 100 UNIT/ML FlexPen INJECT 30 UNITS SUBCUTANEOUSLY IN THE MORNING AND 20 UNITS with SUPPER. 30 mL 3   NOVOLOG FLEXPEN 100 UNIT/ML FlexPen INJECT 8 UNITS SUBCUTANEOUSLY with Dinner. 15 mL 3   ondansetron (ZOFRAN) 4 MG tablet Take 1 tablet (4 mg total) by mouth every 6 (six) hours as needed for nausea. (Patient not taking: Reported on 01/27/2023) 20 tablet 0   sodium zirconium cyclosilicate (LOKELMA) 10 g PACK packet Take 10 g by mouth daily. (Patient not taking: Reported on 01/27/2023) 30 packet 1   Current Facility-Administered Medications  Medication Dose Route Frequency Provider Last Rate Last Admin   sodium chloride flush (NS) 0.9 % injection 3 mL  3 mL Intravenous Q12H Lorine Bears A, MD        PHYSICAL EXAM: Vitals:   01/27/23 1124  BP: 130/70  Pulse: 64  SpO2: 95%  Weight: 238 lb (108 kg)  Height: 5\' 7"  (1.702 m)   Body mass index is 37.28 kg/m.  Wt Readings from Last 3 Encounters:  01/27/23 238 lb (108 kg)  01/24/23 242 lb (109.8 kg)  11/11/22 238 lb (108 kg)    General: Well developed, well nourished male in no apparent distress.  HEENT: AT/[place], no external lesions.  Eyes: Conjunctiva clear and no icterus. Neck: Neck supple  Lungs: Respirations not labored Neurologic: Alert, oriented, normal speech Extremities / Skin: Dry. No sores or rashes noted. Rt BKA. Psychiatric: Does not appear depressed or anxious   Diabetic Foot Exam - Simple   No data filed    LABS Reviewed Lab Results  Component Value Date   HGBA1C 8.4 (H) 01/27/2023   HGBA1C 7.1 (H) 10/16/2022   HGBA1C 7.9 (H) 06/18/2022   Lab Results  Component Value Date   FRUCTOSAMINE 327 (H) 01/14/2022   Lab Results  Component Value Date   CHOL 104 08/23/2021   HDL 38 (L) 08/23/2021   LDLCALC 46 08/23/2021   TRIG 111 08/23/2021   CHOLHDL 2.7 08/23/2021    Lab Results  Component Value Date   MICRALBCREAT 29.9 01/17/2022   MICRALBCREAT 30-300 12/12/2019   Lab Results  Component Value Date   CREATININE 1.32 (H) 01/24/2023   Lab Results  Component Value Date   GFR 53.77 (L) 05/13/2022    ASSESSMENT / PLAN  1. Uncontrolled type 2 diabetes mellitus with hyperglycemia, with long-term current use of insulin (HCC)   2. History of right below knee amputation (HCC)     Diabetes Mellitus type 2, complicated by peripheral neuropathy/diabetic foot ulcer status post right BKA. - Diabetic status / severity: Uncontrolled.  Lab Results  Component Value Date   HGBA1C 8.4 (H) 01/27/2023    - Hemoglobin A1c goal : <7%  Patient complains of hyperglycemia around midnight likely related with postprandial and hypoglycemia in early morning probably related with the disbalance of the insulin dosing.  He is not using regular insulin or NovoLog.  He is using Novolin N at bedtime.  - Medications: Adjusted as follows.  Diabetes regimen: Decrease Novolin N 30 units with breakfast and to 20 unit with dinner/supper. Start Novolog 8 units with supper/ dinner. Resume trulicity 3mg  weekly.  He reports NovoLog is covered by his insurance.  Stop Novolin R.  - Home glucose testing: Dexcom G7/CGM  and check as needed. - Discussed/ Gave Hypoglycemia treatment plan.  # Consult : not required at this time.   # Annual urine for microalbuminuria/ creatinine ratio, + microalbuminuria, not on ARB.  Following with nephrology. Last  Lab Results  Component Value Date   MICRALBCREAT 29.9 01/17/2022    # Foot check nightly / neuropathy, continue gabapentin.  # Annual dilated diabetic eye exams.  Advised to have diabetic eye exam.  - Diet: Eat reasonable portion sizes to promote a healthy weight  2. Blood pressure  -  BP Readings from Last 1 Encounters:  01/27/23 130/70    - Control is in target.  - No change in current plans.  3. Lipid status /  Hyperlipidemia - Last  Lab Results  Component Value Date   LDLCALC 46 08/23/2021   - Continue atorvastatin 10 mg daily.  Diagnoses and all orders for this visit:  Uncontrolled type 2 diabetes mellitus with hyperglycemia, with long-term current use of insulin (HCC) -     POCT Glucose (CBG) -     POCT glycosylated hemoglobin (Hb A1C) -     Dulaglutide (TRULICITY) 3 MG/0.5ML SOPN; Inject 3 mg as directed once a week. -     NOVOLIN N FLEXPEN 100 UNIT/ML FlexPen; INJECT 30 UNITS SUBCUTANEOUSLY IN THE MORNING AND 20 UNITS with SUPPER. -     Hemoglobin A1c; Future -     Hemoglobin A1c  History of right below knee amputation (HCC)  Other orders -     NOVOLOG FLEXPEN 100 UNIT/ML FlexPen; INJECT 8 UNITS SUBCUTANEOUSLY with Dinner.    DISPOSITION Follow up in clinic in 6 weeks suggested.   All questions answered and patient verbalized understanding of the plan.  [place] Dasja Brase, MD [facility] Endocrinology [facility] [address] [place], [address] [place], [place] [address] Phone # [phone]  At least part of this note was generated using voice recognition software. Inadvertent word errors may have occurred, which were not recognized during the proofreading process.

## 2023-01-27 NOTE — Patient Instructions (Signed)
Diabetes regimen: Decrease Novolin N 30 units with breakfast and to 20 unit with dinner/supper. Start Novolog 8 units with supper/ dinner. Resume trulicity 3mg  weekly.

## 2023-01-27 NOTE — Telephone Encounter (Signed)
Patient is in office without an appointment today stating that his blood sugar level is all over the place.  He also states that he has tried calling the office but no one answers.  (Our phone are busy, but there is plenty of times in between calls that they are not busy).  Patient wants to wait for advice.

## 2023-02-02 ENCOUNTER — Telehealth: Payer: Self-pay

## 2023-02-02 NOTE — Telephone Encounter (Signed)
Raymond [name] left a voicemail that his blood sugars is running high in the 400's , I returned his call and he states that he is currently in the hospital in [place] with Kidney issues at this time, I advise him to let us know when he is released and gets home to follow up here in the office

## 2023-02-03 ENCOUNTER — Other Ambulatory Visit: Payer: Self-pay

## 2023-02-03 ENCOUNTER — Telehealth: Payer: Self-pay

## 2023-02-03 ENCOUNTER — Telehealth: Payer: Self-pay | Admitting: Endocrinology

## 2023-02-03 ENCOUNTER — Other Ambulatory Visit: Payer: Self-pay | Admitting: Nurse Practitioner

## 2023-02-03 DIAGNOSIS — Z794 Long term (current) use of insulin: Secondary | ICD-10-CM

## 2023-02-03 MED ORDER — DEXCOM G7 SENSOR MISC: 5 refills | Status: DC

## 2023-02-03 NOTE — Transitions of Care (Post Inpatient/ED Visit) (Signed)
02/03/2023  Name: Raymond [name] MRN: [id number] DOB: [birth year]  Today's TOC FU Call Status: Today's TOC FU Call Status:: Successful TOC FU Call Completed TOC FU Call Complete Date: 02/03/23 Patient's Name and Date of Birth confirmed.  Transition Care Management Follow-up Telephone Call Date of Discharge: 02/02/23 Discharge Facility: Other ([facility]) Name of [facility]) [facility] Type of Discharge: Inpatient Admission Primary Inpatient Discharge Diagnosis:: acute kidney failure How have you been since you were released from the hospital?: Better Any questions or concerns?: No  Items Reviewed: Did you receive and understand the discharge instructions provided?: Yes Medications obtained,verified, and reconciled?: Yes (Medications Reviewed) Any new allergies since your discharge?: No Dietary orders reviewed?: Yes Do you have support at home?: Yes People in Home: spouse  Medications Reviewed Today: Medications Reviewed Today     Reviewed by Karena Addison, LPN (Licensed Practical Nurse) on 02/03/23 at 1518  Med List Status: <None>   Medication Order Taking? Sig Documenting Provider Last Dose Status Informant  albuterol (VENTOLIN HFA) 108 (90 Base) MCG/ACT inhaler [id number] No Inhale 2 puffs into the lungs every 6 (six) hours as needed for wheezing or shortness of breath. [name], FNP Taking Active   atorvastatin (LIPITOR) 10 MG tablet [id number] No Take 1 tablet (10 mg total) by mouth daily. Runell Gess, MD Taking Active   betamethasone dipropionate 0.05 % cream [id number] No Apply topically 2 (two) times daily. Ellender Hose, NP Taking Active   blood glucose meter kit and supplies KIT [id number] No Dispense based on patient and insurance preference. Use up to four times daily as directed. (FOR ICD-9 250.00, 250.01). Roma Kayser, MD Taking Active Self  Blood Glucose Monitoring Suppl (ACCU-CHEK GUIDE ME) w/Device KIT  [id number] No 1 Piece by Does not apply route as directed. Roma Kayser, MD Taking Active Self  brimonidine (ALPHAGAN) 0.2 % ophthalmic solution [id number] No Place 1 drop into both eyes 3 (three) times daily. [provider] Taking Active Self  cloNIDine (CATAPRES - DOSED IN MG/24 HR) 0.3 mg/24hr patch [id number] No Place 1 patch (0.3 mg total) onto the skin once a week.  Patient taking differently: Place 0.3 mg onto the skin once a week. Place patch on Sunday   Runell Gess, MD Taking Active Self  Continuous Blood Gluc Receiver [facility] G7 RECEIVER) [place] [id number] No Use to check blood sugar 3 times a day. Dx code e11.65 [name], FNP Taking Active   Continuous Glucose Sensor (DEXCOM G7 SENSOR) MISC [id number]  USE TO CHECK GLUCOSE THREE TIMES DAILY AS DIRECTED, CHANGE SENSOR EVERY 10 DAYS. [place], [place], MD  Active   docusate sodium (COLACE) 100 MG capsule [id number] No Take 1 capsule (100 mg total) by mouth daily.  Patient not taking: Reported on 01/27/2023   Joycelyn Das, MD Not Taking Active   dorzolamide-timolol (COSOPT) 22.3-6.8 MG/ML ophthalmic solution [id number] No Place 1 drop into both eyes 2 (two) times daily. [provider] Taking Active Self  doxazosin (CARDURA) 4 MG tablet [id number] No Take 4 mg by mouth daily. [provider] Taking Active Self  Dulaglutide (TRULICITY) 3 MG/0.5ML SOPN [id number]  Inject 3 mg as directed once a week. [place], [place], MD  Active   furosemide (LASIX) 20 MG tablet [id number] No 20 mg 2 (two) times daily. [provider] Taking Active   gabapentin (NEURONTIN) 300 MG capsule [id number] No Take 3 capsules (900 mg total) by mouth 3 (three) times daily. 3 times a day [place], Denny Peon  R, NP Taking Active   glucose blood (ACCU-CHEK GUIDE) test strip [id number] No Use as instructed 4 x daily. E11.65  Patient not taking: Reported on 01/27/2023   Roma Kayser, MD Not Taking Active Self  hydrALAZINE (APRESOLINE) 25 MG  tablet [id number] No Take 1 tablet (25 mg total) by mouth 3 (three) times daily. [name], FNP Taking Active   latanoprost (XALATAN) 0.005 % ophthalmic solution [id number] No Place 1 drop into both eyes at bedtime. [provider] Taking Active Self  Multiple Vitamin (MULTIVITAMIN) capsule [id number] No Take 1 capsule by mouth daily. [provider] Taking Active Self  mupirocin ointment (BACTROBAN) 2 % [id number] No Apply 1 application. topically 2 (two) times daily. For wound care Asencion Islam, DPM Taking Active Self  NOVOLIN N FLEXPEN 100 UNIT/ML FlexPen [id number]  INJECT 30 UNITS SUBCUTANEOUSLY IN THE MORNING AND 20 UNITS with SUPPER. [place], [place], MD  Active   NOVOLOG FLEXPEN 100 UNIT/ML FlexPen [id number]  INJECT 8 UNITS SUBCUTANEOUSLY with Dinner. [place], [place], MD  Active   ondansetron (ZOFRAN) 4 MG tablet [id number] No Take 1 tablet (4 mg total) by mouth every 6 (six) hours as needed for nausea.  Patient not taking: Reported on 01/27/2023   Joycelyn Das, MD Not Taking Active   sodium bicarbonate 650 MG tablet [id number] No Take 1,300 mg by mouth 3 (three) times daily. [provider] Taking Active Self  sodium chloride flush (NS) 0.9 % injection 3 mL [id number]   [place] Ouch, MD  Active   sodium zirconium cyclosilicate (LOKELMA) 10 g PACK packet [id number] No Take 10 g by mouth daily.  Patient not taking: Reported on 01/27/2023   Cathren Harsh, MD Not Taking Active Self  tacrolimus (PROGRAF) 1 MG capsule [id number] No Take 4 capsules (4 mg total) by mouth 2 (two) times daily. Cathren Harsh, MD Taking Active Self  Vitamin D, Ergocalciferol, (DRISDOL) 50000 units CAPS capsule [id number] No Take 50,000 Units by mouth See admin instructions. 15th of the month [provider] Taking Active Self            Home Care and Equipment/Supplies: Were Home Health Services Ordered?: NA Any new equipment or medical supplies ordered?: NA  Functional  Questionnaire: Do you need assistance with bathing/showering or dressing?: No Do you need assistance with meal preparation?: No Do you need assistance with eating?: No Do you have difficulty maintaining continence: No Do you need assistance with getting out of bed/getting out of a chair/moving?: No Do you have difficulty managing or taking your medications?: No  Follow up appointments reviewed: PCP Follow-up appointment confirmed?: Yes Date of PCP follow-up appointment?: 02/19/23 Follow-up Provider: [facility] Follow-up appointment confirmed?: NA Do you need transportation to your follow-up appointment?: No Do you understand care options if your condition(s) worsen?: Yes-patient verbalized understanding    SIGNATURE Karena Addison, LPN [facility] Nurse Health Advisor Direct Dial [phone]

## 2023-02-03 NOTE — Telephone Encounter (Signed)
MEDICATION: Dexcom G7 Sensor Continuous Blood Gluc Sensor (DEXCOM G7 SENSOR) MISC  PHARMACY:    [id number] - [place], [place] - [address] (Ph: [phone])    HAS THE PATIENT CONTACTED THEIR PHARMACY?  yes  IS THIS A 90 DAY SUPPLY : yes  IS PATIENT OUT OF MEDICATION: yes  IF NOT; HOW MUCH IS LEFT:   LAST APPOINTMENT DATE: @9 /07/2022  NEXT APPOINTMENT DATE:@10 /24/2024  DO WE HAVE YOUR PERMISSION TO LEAVE A DETAILED MESSAGE?: Yes  OTHER COMMENTS:    **Let patient know to contact pharmacy at the end of the day to make sure medication is ready. **  ** Please notify patient to allow 48-72 hours to process**  **Encourage patient to contact the pharmacy for refills or they can request refills through [facility]**

## 2023-02-03 NOTE — Telephone Encounter (Signed)
MEDICATION: Dexcom G7 Sensor Continuous Blood Gluc Sensor (DEXCOM G7 SENSOR) MISC   PHARMACY:    [id number] - [place], [place] - [address] (Ph: [phone])   Dexcom G7 Sensors has been sent to [facility]

## 2023-02-11 ENCOUNTER — Encounter: Payer: Self-pay | Admitting: Endocrinology

## 2023-02-11 ENCOUNTER — Ambulatory Visit (INDEPENDENT_AMBULATORY_CARE_PROVIDER_SITE_OTHER): Payer: 59 | Admitting: Endocrinology

## 2023-02-11 VITALS — BP 138/90 | HR 76 | Resp 16 | Ht 67.0 in | Wt 237.6 lb

## 2023-02-11 DIAGNOSIS — Z89511 Acquired absence of right leg below knee: Secondary | ICD-10-CM

## 2023-02-11 DIAGNOSIS — E1165 Type 2 diabetes mellitus with hyperglycemia: Secondary | ICD-10-CM | POA: Diagnosis not present

## 2023-02-11 DIAGNOSIS — Z794 Long term (current) use of insulin: Secondary | ICD-10-CM | POA: Diagnosis not present

## 2023-02-11 NOTE — Progress Notes (Signed)
Outpatient Endocrinology Note Raymond Jayke Caul, MD  02/11/23  Patient's Name: Raymond [name]    DOB: 25-Nov-1960    MRN: [id number]                                                    REASON OF VISIT: Follow up for type 2 diabetes mellitus  PCP: [name], FNP  HISTORY OF PRESENT ILLNESS:   Raymond [name] is a 62 y.o. old male with past medical history listed below, is here for follow up of type 2 diabetes mellitus.   Pertinent Diabetes History: Patient was diagnosed with type 2 diabetes mellitus in 2002.  Insulin therapy was started in 2018.  He has uncontrolled diabetes mellitus hemoglobin A1c in the range of 7.1 to around 14%.  In the visit in January 27, 2023 patient was taking Novolin and 30 units 2 times a day, insulin regimen was adjusted with the start of NovoLog with meals.  Chronic Diabetes Complications : Retinopathy: unknown. Last ophthalmology exam was done on Due. Nephropathy: Microalbuminuria present, following with nephrology. Peripheral neuropathy: yes, on gabapentin.  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 Coronary artery disease: no Stroke: no  Relevant comorbidities and cardiovascular risk factors: Obesity: yes Body mass index is 37.21 kg/m.  Hypertension: yes Hyperlipidemia. Yes, on statin  Current / Home Diabetic regimen includes: Novolin N 30 units in the morning with breakfast and 20 units at bedtime.  Sometimes he takes Novolin N 30 units at bedtime. NovoLog 10 units with breakfast and 10 units with supper.  Not fully compliant.  Prior diabetic medications: Glipizide in the past.  Glycemic data:    CONTINUOUS GLUCOSE MONITORING SYSTEM (CGMS) INTERPRETATION:                         Dexcom G7 CGM-  Sensor Download (Sensor download was reviewed and summarized below.) Dates: September prior to February 11, 2023  Glucose Management Indicator: n/a% Sensor Average: 247 SD 101  Glycemic Trends:  <54: <1% 54-70: 1% 71-180:  28% 181-250: 28% 251-400: 43%  Impression: -Mostly hyperglycemia with blood sugar 300-400 range especially with supper and some time with lunch.  Acceptable blood sugar mostly with the breakfast.  Some of the days he had persistent hyperglycemia also overnight and daytime that happened on September 7 and September 6.  Hyperglycemia related with missing insulin, NovoLog and not enough NovoLog for the carb in his meals.  With the persistent hyperglycemia overnight especially on September 7, he may have missed night dose of NPH as well.  Most of the blood sugar overnight trending down towards normal and had occasional hypoglycemia around midnight to 3 AM timeframe, with blood sugar up to 50-60 range.  Hypoglycemia: Patient has minor hypoglycemic episodes. Patient has hypoglycemia awareness/  Factors modifying glucose control: 1.  Diabetic diet assessment: 3 meals a day and he reports frequent snacking.  Dinner around 8 PM.  Drinks prune juice or range and can vary juices.  2.  Staying active or exercising: None  3.  Medication compliance: compliant most of the time.  Interval history 02/11/23 Dexcom/CGM data as reviewed above.  He is mostly having hyperglycemia.  He is not fully sure about the insulin dosing and how many tabs he has been taking Novolin and and the NovoLog, seems to  be not fully compliant.  He tends to give higher dose of Novolin and at bedtime when blood sugar is significantly high and have trending down blood sugar with occasional hypoglycemia by the morning.  He has been eating fruit juices orange juice and can be reduced however denies drinking sodas.  He has not been able to refill Trulicity, has not started it.  No other complaints today.  REVIEW OF SYSTEMS As per history of present illness.   PAST MEDICAL HISTORY: Past Medical History:  Diagnosis Date   Diabetes (HCC)    Diabetes mellitus without complication (HCC)    type 2   ESRD (end stage renal disease) (HCC)     03/09/2015- patient had a kidney transplant   Hypertension    Nausea vomiting and diarrhea 12/10/2015   Peripheral vascular disease (HCC)    Renal disorder    Renal insufficiency     PAST SURGICAL HISTORY: Past Surgical History:  Procedure Laterality Date   ABDOMINAL AORTOGRAM W/LOWER EXTREMITY N/A 09/11/2021   Procedure: ABDOMINAL AORTOGRAM W/LOWER EXTREMITY;  Surgeon: Iran Ouch, MD;  Location: [facility];  Service: Cardiovascular;  Laterality: N/A;   AMPUTATION Right 09/25/2021   Procedure: RIGHT TRANSMETATARSAL AMPUTATION AND APPLY TISSUE GRAFT;  Surgeon: Nadara Mustard, MD;  Location: [facility] OR;  Service: Orthopedics;  Laterality: Right;   AMPUTATION Right 11/16/2021   Procedure: RIGHT AMPUTATION BELOW KNEE WITH WOUND VAC APPLICATION;  Surgeon: Nadara Mustard, MD;  Location: [facility] OR;  Service: Orthopedics;  Laterality: Right;   BONE BIOPSY Right 03/06/2021   Procedure: BONE BIOPSY;  Surgeon: Vivi Barrack, DPM;  Location: [facility];  Service: Podiatry;  Laterality: Right;   CENTRAL VENOUS CATHETER INSERTION Right 01/20/2020   Procedure: INSERTION CENTRAL LINE ADULT; Tunneled central line;  Surgeon: Lucretia Roers, MD;  Location: [facility];  Service: General;  Laterality: Right;   COLONOSCOPY N/A 12/05/2014   OZH:YQMVHQIO external and internal hemorrhoid/mild diverticulosis/11 polyps removed   ESOPHAGOGASTRODUODENOSCOPY N/A 12/05/2014   NGE:XBMW duodenitis   GRAFT APPLICATION Right 03/06/2021   Procedure: GRAFT APPLICATION;  Surgeon: Vivi Barrack, DPM;  Location: [facility];  Service: Podiatry;  Laterality: Right;   INCISION AND DRAINAGE OF WOUND Right 08/15/2021   Procedure: IRRIGATION AND DEBRIDEMENT WOUND;  Surgeon: Asencion Islam, DPM;  Location: [facility];  Service: Podiatry;  Laterality: Right;  need to make card not  a irrigation and debridement card   IR FLUORO GUIDE CV LINE RIGHT  03/01/2019   IR PERC TUN PERIT CATH WO PORT S&I /IMAG  06/27/2021   IR REMOVAL TUN CV  CATH W/O FL  05/10/2019   IR REMOVAL TUN CV CATH W/O FL  02/29/2020   IR REMOVAL TUN CV CATH W/O FL  07/19/2021   IR US GUIDE VASC ACCESS RIGHT  03/01/2019   IR US GUIDE VASC ACCESS RIGHT  06/27/2021   IRRIGATION AND DEBRIDEMENT ELBOW Left 06/24/2021   Procedure: IRRIGATION AND DEBRIDEMENT ELBOW;  Surgeon: Lyndle Herrlich, MD;  Location: [facility] ORS;  Service: Orthopedics;  Laterality: Left;   KIDNEY TRANSPLANT  03/27/2015   Penile Pump Insertion     PERIPHERAL VASCULAR ATHERECTOMY  09/11/2021   Procedure: PERIPHERAL VASCULAR ATHERECTOMY;  Surgeon: Iran Ouch, MD;  Location: [facility];  Service: Cardiovascular;;  Shockwave Lithotripsy   TEE WITHOUT CARDIOVERSION N/A 01/17/2020   Procedure: TRANSESOPHAGEAL ECHOCARDIOGRAM (TEE) WITH PROPOFOL;  Surgeon: Antoine Poche, MD;  Location: [facility];  Service: Endoscopy;  Laterality:  N/A;   WOUND DEBRIDEMENT Right 03/06/2021   Procedure: DEBRIDEMENT WOUND;  Surgeon: Vivi Barrack, DPM;  Location: [facility];  Service: Podiatry;  Laterality: Right;    ALLERGIES: Allergies  Allergen Reactions   Doxycycline     Runs up pt's blood sugar and phosphorus   Bactrim [Sulfamethoxazole-Trimethoprim]     Runs pt's sugar and phosphorus   Flagyl [Metronidazole] Rash    Patient having hives to either the vancomycin or flagyl (both infusing together).   Vancomycin Rash    Patient having hives to either vancomycin or flagyl    FAMILY HISTORY:  Family History  Problem Relation Age of Onset   Diabetes Mother    Heart disease Father    Colon cancer Neg Hx     SOCIAL HISTORY: Social History   Socioeconomic History   Marital status: Married    Spouse name: Not on file   Number of children: Not on file   Years of education: Not on file   Highest education level: Not on file  Occupational History   Not on file  Tobacco Use   Smoking status: Never   Smokeless tobacco: Never  Vaping Use   Vaping status: Never Used  Substance and  Sexual Activity   Alcohol use: No    Alcohol/week: 0.0 standard drinks of alcohol   Drug use: No   Sexual activity: Yes  Other Topics Concern   Not on file  Social History Narrative   ** Merged History Encounter **       Social Determinants of Health   Financial Resource Strain: Not on file  Food Insecurity: Low Risk  (01/30/2023)   Received from [facility]   Hunger Vital Sign    Worried About Running Out of Food in the Last Year: Never true    Ran Out of Food in the Last Year: Never true  Transportation Needs: No Transportation Needs (01/30/2023)   Received from [facility]    In the past 12 months, has lack of reliable transportation kept you from medical appointments, meetings, work or from getting things needed for daily living? : No  Physical Activity: Not on file  Stress: Not on file  Social Connections: Not on file    MEDICATIONS:  Current Outpatient Medications  Medication Sig Dispense Refill   albuterol (VENTOLIN HFA) 108 (90 Base) MCG/ACT inhaler Inhale 2 puffs into the lungs every 6 (six) hours as needed for wheezing or shortness of breath. 8 g 2   atorvastatin (LIPITOR) 10 MG tablet Take 1 tablet (10 mg total) by mouth daily. 90 tablet 3   betamethasone dipropionate 0.05 % cream Apply topically 2 (two) times daily. 30 g 0   blood glucose meter kit and supplies KIT Dispense based on patient and insurance preference. Use up to four times daily as directed. (FOR ICD-9 250.00, 250.01). 1 each 0   Blood Glucose Monitoring Suppl (ACCU-CHEK GUIDE ME) w/Device KIT 1 Piece by Does not apply route as directed. 1 kit 0   brimonidine (ALPHAGAN) 0.2 % ophthalmic solution Place 1 drop into both eyes 3 (three) times daily.     cloNIDine (CATAPRES - DOSED IN MG/24 HR) 0.3 mg/24hr patch Place 1 patch (0.3 mg total) onto the skin once a week. (Patient taking differently: Place 0.3 mg onto the skin once a week. Place patch on Sunday) 4 patch 12   Continuous Blood Gluc  Receiver (DEXCOM G7 RECEIVER) DEVI Use to check blood sugar 3 times a  day. Dx code e11.65 1 each 1   Continuous Glucose Sensor (DEXCOM G7 SENSOR) MISC USE TO CHECK GLUCOSE THREE TIMES DAILY AS DIRECTED, CHANGE SENSOR EVERY 10 DAYS. 3 each 5   docusate sodium (COLACE) 100 MG capsule Take 1 capsule (100 mg total) by mouth daily. 10 capsule 0   dorzolamide-timolol (COSOPT) 22.3-6.8 MG/ML ophthalmic solution Place 1 drop into both eyes 2 (two) times daily.     doxazosin (CARDURA) 4 MG tablet Take 4 mg by mouth daily.     Dulaglutide (TRULICITY) 3 MG/0.5ML SOPN Inject 3 mg as directed once a week. 6 mL 4   furosemide (LASIX) 20 MG tablet 20 mg 2 (two) times daily.     gabapentin (NEURONTIN) 300 MG capsule Take 3 capsules (900 mg total) by mouth 3 (three) times daily. 3 times a day 270 capsule 3   glucose blood (ACCU-CHEK GUIDE) test strip Use as instructed 4 x daily. E11.65 150 each 5   hydrALAZINE (APRESOLINE) 25 MG tablet Take 1 tablet (25 mg total) by mouth 3 (three) times daily. 270 tablet 1   latanoprost (XALATAN) 0.005 % ophthalmic solution Place 1 drop into both eyes at bedtime.     Multiple Vitamin (MULTIVITAMIN) capsule Take 1 capsule by mouth daily.     mupirocin ointment (BACTROBAN) 2 % Apply 1 application. topically 2 (two) times daily. For wound care 30 g 0   NOVOLIN N FLEXPEN 100 UNIT/ML FlexPen INJECT 30 UNITS SUBCUTANEOUSLY IN THE MORNING AND 20 UNITS with SUPPER. 30 mL 3   NOVOLOG FLEXPEN 100 UNIT/ML FlexPen INJECT 8 UNITS SUBCUTANEOUSLY with Dinner. 15 mL 3   ondansetron (ZOFRAN) 4 MG tablet Take 1 tablet (4 mg total) by mouth every 6 (six) hours as needed for nausea. 20 tablet 0   sodium bicarbonate 650 MG tablet Take 1,300 mg by mouth 3 (three) times daily.     sodium zirconium cyclosilicate (LOKELMA) 10 g PACK packet Take 10 g by mouth daily. 30 packet 1   tacrolimus (PROGRAF) 1 MG capsule Take 4 capsules (4 mg total) by mouth 2 (two) times daily.     Vitamin D, Ergocalciferol,  (DRISDOL) 50000 units CAPS capsule Take 50,000 Units by mouth See admin instructions. 15th of the month     Current Facility-Administered Medications  Medication Dose Route Frequency Provider Last Rate Last Admin   sodium chloride flush (NS) 0.9 % injection 3 mL  3 mL Intravenous Q12H Iran Ouch, MD        PHYSICAL EXAM: Vitals:   02/11/23 1133  BP: (!) 138/90  Pulse: 76  Resp: 16  SpO2: 94%  Weight: 237 lb 9.6 oz (107.8 kg)  Height: 5\' 7"  (1.702 m)    Body mass index is 37.21 kg/m.  Wt Readings from Last 3 Encounters:  02/11/23 237 lb 9.6 oz (107.8 kg)  01/27/23 238 lb (108 kg)  01/24/23 242 lb (109.8 kg)    General: Well developed, well nourished male in no apparent distress.  HEENT: AT/[place], no external lesions.  Eyes: Conjunctiva clear and no icterus. Neck: Neck supple  Lungs: Respirations not labored Neurologic: Alert, oriented, normal speech Extremities / Skin: Dry. No sores or rashes noted. Rt BKA. Psychiatric: Does not appear depressed or anxious   Diabetic Foot Exam - Simple   No data filed    LABS Reviewed Lab Results  Component Value Date   HGBA1C 8.4 (H) 01/27/2023   HGBA1C 7.1 (H) 10/16/2022   HGBA1C 7.9 (H) 06/18/2022  Lab Results  Component Value Date   FRUCTOSAMINE 327 (H) 01/14/2022   Lab Results  Component Value Date   CHOL 104 08/23/2021   HDL 38 (L) 08/23/2021   LDLCALC 46 08/23/2021   TRIG 111 08/23/2021   CHOLHDL 2.7 08/23/2021   Lab Results  Component Value Date   MICRALBCREAT 29.9 01/17/2022   MICRALBCREAT 30-300 12/12/2019   Lab Results  Component Value Date   CREATININE 1.32 (H) 01/24/2023   Lab Results  Component Value Date   GFR 53.77 (L) 05/13/2022    ASSESSMENT / PLAN  1. Uncontrolled type 2 diabetes mellitus with hyperglycemia, with long-term current use of insulin (HCC)     Diabetes Mellitus type 2, complicated by peripheral neuropathy/diabetic foot ulcer status post right BKA. - Diabetic status /  severity: Uncontrolled.  Lab Results  Component Value Date   HGBA1C 8.4 (H) 01/27/2023    - Hemoglobin A1c goal : <7%  Discussed about compliance of the insulin.  Discussed about difference between Novolin N and at the NovoLog insulin.  He is mainly having hypoglycemia by the morning probably related with higher dose of Novolin and he sometimes takes up to 30 units at bedtime.  He has been taking Novolin at bedtime.  Advised to take Novolin the evening dose with supper.  Advised not to take Novolin and more than 20 units in the evening.  Adjusted insulin regimen as follows.  Advised patient to stop drinking orange and cranberry juices.  Advised to drink more of water.   - Medications: Adjusted as follows. Diabetes regimen: Novolin N 30 units with breakfast and 20 unit with dinner/supper. Change Novolog 10 units with meals three times days, with breakfast, lunch and dinner. Resume trulicity 3mg  weekly when able to refill.  - Home glucose testing: Dexcom G7/CGM and check as needed. - Discussed/ Gave Hypoglycemia treatment plan.  # Consult : not required at this time.   # Annual urine for microalbuminuria/ creatinine ratio, + microalbuminuria, not on ARB.  Following with nephrology. Last  Lab Results  Component Value Date   MICRALBCREAT 29.9 01/17/2022    # Foot check nightly / neuropathy, continue gabapentin.  # Annual dilated diabetic eye exams.  Advised to have diabetic eye exam.  - Diet: Eat reasonable portion sizes to promote a healthy weight  2. Blood pressure  -  BP Readings from Last 1 Encounters:  02/11/23 (!) 138/90    - Control is in target.  - No change in current plans.  3. Lipid status / Hyperlipidemia - Last  Lab Results  Component Value Date   LDLCALC 46 08/23/2021   - Continue atorvastatin 10 mg daily.  Raymond [name] was seen today for hypertension.  Diagnoses and all orders for this visit:  Uncontrolled type 2 diabetes mellitus with  hyperglycemia, with long-term current use of insulin (HCC)     DISPOSITION Follow up in clinic in 6 weeks suggested.   All questions answered and patient verbalized understanding of the plan.  Raymond Kruz Chiu, MD [facility] [address] [place], [address] [place], [place] [address] Phone # [phone]  At least part of this note was generated using voice recognition software. Inadvertent word errors may have occurred, which were not recognized during the proofreading process.

## 2023-02-11 NOTE — Patient Instructions (Signed)
Diabetes regimen: Novolin N 30 units with breakfast and 20 unit with dinner/supper. Novolog 10 units with meals three times days.  Resume trulicity 3mg  weekly.

## 2023-02-12 ENCOUNTER — Telehealth: Payer: Self-pay

## 2023-02-12 ENCOUNTER — Encounter: Payer: Self-pay | Admitting: Endocrinology

## 2023-02-12 NOTE — Telephone Encounter (Signed)
Pharmacy Patient Advocate Encounter   Received notification from CoverMyMeds that prior authorization for Trulicity is required/requested.   Per test claim: PA required; PA submitted to [facility] via CoverMyMeds Key/confirmation #/EOC [id number] Status is pending

## 2023-02-16 NOTE — Telephone Encounter (Signed)
Pharmacy Patient Advocate Encounter  Received notification from [facility] that Prior Authorization for Trulicity has been APPROVED through 02/11/2024   PA #/Case ID/Reference #: [id number]

## 2023-02-17 NOTE — Telephone Encounter (Signed)
Patient is aware -LVM

## 2023-02-19 ENCOUNTER — Ambulatory Visit: Payer: 59 | Admitting: Nurse Practitioner

## 2023-02-24 ENCOUNTER — Other Ambulatory Visit: Payer: Self-pay | Admitting: Cardiovascular Disease

## 2023-02-25 NOTE — Telephone Encounter (Signed)
Refill Request.  

## 2023-03-12 ENCOUNTER — Ambulatory Visit (INDEPENDENT_AMBULATORY_CARE_PROVIDER_SITE_OTHER): Payer: 59 | Admitting: Nurse Practitioner

## 2023-03-12 ENCOUNTER — Encounter: Payer: Self-pay | Admitting: Nurse Practitioner

## 2023-03-12 VITALS — BP 120/74 | HR 74 | Temp 98.1°F | Ht 67.0 in | Wt 229.2 lb

## 2023-03-12 DIAGNOSIS — E1122 Type 2 diabetes mellitus with diabetic chronic kidney disease: Secondary | ICD-10-CM | POA: Diagnosis not present

## 2023-03-12 DIAGNOSIS — J3489 Other specified disorders of nose and nasal sinuses: Secondary | ICD-10-CM

## 2023-03-12 DIAGNOSIS — Z23 Encounter for immunization: Secondary | ICD-10-CM

## 2023-03-12 DIAGNOSIS — E782 Mixed hyperlipidemia: Secondary | ICD-10-CM

## 2023-03-12 DIAGNOSIS — E66812 Obesity, class 2: Secondary | ICD-10-CM

## 2023-03-12 DIAGNOSIS — N1832 Chronic kidney disease, stage 3b: Secondary | ICD-10-CM

## 2023-03-12 DIAGNOSIS — E6609 Other obesity due to excess calories: Secondary | ICD-10-CM

## 2023-03-12 DIAGNOSIS — Z2821 Immunization not carried out because of patient refusal: Secondary | ICD-10-CM

## 2023-03-12 DIAGNOSIS — Z794 Long term (current) use of insulin: Secondary | ICD-10-CM

## 2023-03-12 DIAGNOSIS — I129 Hypertensive chronic kidney disease with stage 1 through stage 4 chronic kidney disease, or unspecified chronic kidney disease: Secondary | ICD-10-CM

## 2023-03-12 DIAGNOSIS — Z6835 Body mass index (BMI) 35.0-35.9, adult: Secondary | ICD-10-CM

## 2023-03-12 NOTE — Progress Notes (Signed)
Madelaine Bhat, CMA,acting as a Neurosurgeon for [name], FNP.,have documented all relevant documentation on the behalf of [name], FNP,as directed by  [name], FNP while in the presence of [name], FNP.  Subjective:  Patient ID: Raymond [name] , male    DOB: [birth year] , 62 y.o.   MRN: [id number]  Chief Complaint  Patient presents with   Hypertension    HPI  Patient presents today for a bp, chol, and dm follow up, Patient reports compliance with medication. Patient denies any chest pain, SOB, or headaches. Patient has sinus drainage. He continues to be followed by Endocrinology. He has increased his dose of fluid pill. He is scheduled to do a biopsy on 10/23 in [place] with the transplant team. He is due to see Endocrinology the day before but will have to change the date.   BP Readings from Last 3 Encounters: 03/12/23 : 120/74 02/11/23 : (!) 138/90 01/27/23 : 130/70    Diabetes He presents for his follow-up diabetic visit. He has type 2 diabetes mellitus. His disease course has been worsening. There are no hypoglycemic associated symptoms. Pertinent negatives for hypoglycemia include no nervousness/anxiousness. There are no diabetic associated symptoms. Pertinent negatives for diabetes include no chest pain, no polydipsia, no polyphagia and no polyuria. There are no hypoglycemic complications. Diabetic complications include nephropathy. Pertinent negatives for diabetic complications include no heart disease or peripheral neuropathy. Risk factors for coronary artery disease include obesity, sedentary lifestyle, male sex, hypertension, dyslipidemia and diabetes mellitus (Wheelchair bound). Current diabetic treatment includes oral agent (dual therapy).  Hypertension Pertinent negatives include no chest pain or palpitations.     Past Medical History:  Diagnosis Date   Diabetes (HCC)    Diabetes mellitus without complication (HCC)    type 2   ESRD (end stage renal  disease) (HCC)    03/09/2015- patient had a kidney transplant   Hypertension    Nausea vomiting and diarrhea 12/10/2015   Peripheral vascular disease (HCC)    Renal disorder    Renal insufficiency      Family History  Problem Relation Age of Onset   Diabetes Mother    Heart disease Father    Colon cancer Neg Hx     No current facility-administered medications for this visit. No current outpatient medications on file.  Facility-Administered Medications Ordered in Other Visits:    acetaminophen (TYLENOL) tablet 650 mg, 650 mg, Oral, Q6H PRN, 650 mg at 03/24/23 1408 **OR** acetaminophen (TYLENOL) suppository 650 mg, 650 mg, Rectal, Q6H PRN, Johnson, Clanford L, MD   atorvastatin (LIPITOR) tablet 10 mg, 10 mg, Oral, QPM, Johnson, Clanford L, MD   azaTHIOprine (IMURAN) tablet 150 mg, 150 mg, Oral, Daily, Johnson, Clanford L, MD, 150 mg at 03/24/23 1146   brimonidine (ALPHAGAN) 0.2 % ophthalmic solution 1 drop, 1 drop, Both Eyes, TID, Johnson, Clanford L, MD   [START ON 03/29/2023] cloNIDine (CATAPRES - Dosed in mg/24 hr) patch 0.3 mg, 0.3 mg, Transdermal, Weekly, Johnson, Clanford L, MD   dorzolamide-timolol (COSOPT) 2-0.5 % ophthalmic solution 1 drop, 1 drop, Both Eyes, BID, Johnson, Clanford L, MD   fentaNYL (SUBLIMAZE) injection 12.5 mcg, 12.5 mcg, Intravenous, Q2H PRN, Johnson, Clanford L, MD   gabapentin (NEURONTIN) capsule 300 mg, 300 mg, Oral, TID PRN, Johnson, Clanford L, MD   hydrALAZINE (APRESOLINE) injection 10 mg, 10 mg, Intravenous, Q4H PRN, Johnson, Clanford L, MD, 10 mg at 03/24/23 1407   hydrALAZINE (APRESOLINE) tablet 25 mg, 25 mg, Oral, TID, Laural Benes,  Clanford L, MD   insulin aspart (novoLOG) injection 0-9 Units, 0-9 Units, Subcutaneous, TID WC, Johnson, Clanford L, MD, 1 Units at 03/24/23 1028   insulin NPH Human (NOVOLIN N) injection 12 Units, 12 Units, Subcutaneous, BID AC & HS, Johnson, Clanford L, MD, 12 Units at 03/24/23 1149   lactated ringers infusion, ,  Intravenous, Continuous, Maxie Barb, MD, Last Rate: 100 mL/hr at 03/24/23 1031, New Bag at 03/24/23 1031   latanoprost (XALATAN) 0.005 % ophthalmic solution 1 drop, 1 drop, Both Eyes, QHS, Johnson, Clanford L, MD   mycophenolate (MYFORTIC) EC tablet 720 mg, 720 mg, Oral, BID, Johnson, Clanford L, MD, 720 mg at 03/24/23 1146   ondansetron (ZOFRAN) tablet 4 mg, 4 mg, Oral, Q6H PRN **OR** ondansetron (ZOFRAN) injection 4 mg, 4 mg, Intravenous, Q6H PRN, Johnson, Clanford L, MD, 4 mg at 03/24/23 1408   oxyCODONE (Oxy IR/ROXICODONE) immediate release tablet 5 mg, 5 mg, Oral, Q6H PRN, Johnson, Clanford L, MD   prochlorperazine (COMPAZINE) injection 10 mg, 10 mg, Intravenous, Q4H PRN, Johnson, Clanford L, MD   sodium bicarbonate tablet 1,300 mg, 1,300 mg, Oral, TID, Johnson, Clanford L, MD   sodium zirconium cyclosilicate (LOKELMA) packet 10 g, 10 g, Oral, TID, Johnson, Clanford L, MD, 10 g at 03/24/23 1029   tacrolimus (PROGRAF) capsule 3 mg, 3 mg, Oral, BID, Maxie Barb, MD, 3 mg at 03/24/23 1147   Allergies  Allergen Reactions   Bactrim [Sulfamethoxazole-Trimethoprim]     Runs pt's sugar and phosphorus   Doxycycline Other (See Comments)    Runs up pt's blood sugar and phosphorus   Flagyl [Metronidazole] Rash    Patient having hives to either the vancomycin or flagyl (both infusing together).   Vancomycin Rash    Patient having hives to either vancomycin or flagyl     Review of Systems  Constitutional: Negative.   HENT: Negative.    Eyes: Negative.   Respiratory: Negative.    Cardiovascular: Negative.  Negative for chest pain, palpitations and leg swelling.  Gastrointestinal: Negative.   Endocrine: Negative for polydipsia, polyphagia and polyuria.  Psychiatric/Behavioral: Negative.  The patient is not nervous/anxious.      Today's Vitals   03/12/23 1510  BP: 120/74  Pulse: 74  Temp: 98.1 F (36.7 C)  TempSrc: Oral  Weight: 229 lb 3.2 oz (104 kg)  Height: 5\' 7"   (1.702 m)  PainSc: 0-No pain   Body mass index is 35.9 kg/m.  Wt Readings from Last 3 Encounters:  03/24/23 222 lb 3.6 oz (100.8 kg)  03/12/23 229 lb 3.2 oz (104 kg)  02/11/23 237 lb 9.6 oz (107.8 kg)     Objective:  Physical Exam Vitals reviewed.  Constitutional:      General: He is not in acute distress.    Appearance: Normal appearance. He is obese.  Cardiovascular:     Rate and Rhythm: Normal rate and regular rhythm.     Pulses: Normal pulses.     Heart sounds: Normal heart sounds. No murmur heard. Pulmonary:     Effort: Pulmonary effort is normal. No respiratory distress.     Breath sounds: Normal breath sounds. No wheezing.  Skin:    General: Skin is warm.     Capillary Refill: Capillary refill takes less than 2 seconds.  Neurological:     General: No focal deficit present.     Mental Status: He is alert and oriented to person, place, and time.     Cranial Nerves: No cranial nerve  deficit.     Motor: No weakness.         Assessment And Plan:  Type 2 diabetes mellitus with stage 3b chronic kidney disease, with long-term current use of insulin (HCC) Assessment & Plan: Chronic, stable. No current medications.   Orders: -     Microalbumin / creatinine urine ratio  Benign hypertension with chronic kidney disease Assessment & Plan: Blood pressure is well controlled, continue current medications.    Mixed hyperlipidemia Assessment & Plan: Cholesterol levels are stable. Continue current medications.   Orders: -     Lipid panel  Sinus drainage Assessment & Plan: He is advised to use a nasal spray   COVID-19 vaccination declined Assessment & Plan: Declines covid 19 vaccine. Discussed risk of covid 13 and if he changes her mind about the vaccine to call the office. Education has been provided regarding the importance of this vaccine but patient still declined. Advised may receive this vaccine at local pharmacy or Health Dept.or vaccine clinic. Aware to  provide a copy of the vaccination record if obtained from local pharmacy or Health Dept.  Encouraged to take multivitamin, vitamin d, vitamin c and zinc to increase immune system. Aware can call office if would like to have vaccine here at office. Verbalized acceptance and understanding.    Class 2 obesity due to excess calories with body mass index (BMI) of 35.0 to 35.9 in adult, unspecified whether serious comorbidity present Assessment & Plan: he is encouraged to strive for BMI less than 30 to decrease cardiac risk. Advised to aim for at least 150 minutes of exercise per week.    Need for influenza vaccination Assessment & Plan: Influenza vaccine administered Encouraged to take Tylenol as needed for fever or muscle aches.   Orders: -     Flu vaccine trivalent PF, 6mos and older(Flulaval,Afluria,Fluarix,Fluzone)  Type 2 diabetes mellitus with stage 3b chronic kidney disease, without long-term current use of insulin (HCC) Assessment & Plan: Chronic, stable. No current medications.     Return for DM recheck and HM 4 months.  Patient was given opportunity to ask questions. Patient verbalized understanding of the plan and was able to repeat key elements of the plan. All questions were answered to their satisfaction.    Jeanell Sparrow, FNP, have reviewed all documentation for this visit. The documentation on 03/12/23 for the exam, diagnosis, procedures, and orders are all accurate and complete.   IF YOU HAVE BEEN REFERRED TO A SPECIALIST, IT MAY TAKE 1-2 WEEKS TO SCHEDULE/PROCESS THE REFERRAL. IF YOU HAVE NOT HEARD FROM US/SPECIALIST IN TWO WEEKS, PLEASE GIVE [place] A CALL AT [phone].

## 2023-03-13 LAB — LIPID PANEL
Chol/HDL Ratio: 3 {ratio} (ref 0.0–5.0)
Cholesterol, Total: 140 mg/dL (ref 100–199)
HDL: 47 mg/dL (ref >39)
LDL Chol Calc (NIH): 75 mg/dL (ref 0–99)
Triglycerides: 93 mg/dL (ref 0–149)
VLDL Cholesterol Cal: 18 mg/dL (ref 5–40)

## 2023-03-13 LAB — MICROALBUMIN / CREATININE URINE RATIO
Creatinine, Urine: 89.1 mg/dL
Microalb/Creat Ratio: 192 mg/g{creat} — ABNORMAL HIGH (ref 0–29)
Microalbumin, Urine: 171.1 ug/mL

## 2023-03-19 ENCOUNTER — Telehealth: Payer: Self-pay

## 2023-03-19 ENCOUNTER — Ambulatory Visit: Payer: 59 | Admitting: Endocrinology

## 2023-03-19 ENCOUNTER — Telehealth: Payer: Self-pay | Admitting: Endocrinology

## 2023-03-19 NOTE — Telephone Encounter (Signed)
Part K patient was seen by nephrology today.  Recently started on prednisone 20 mg 2 times a day and gradually taper by 10 mg every 7 days and finally to stay on 5 mg daily.  Received message and talked with nephrology Dr. Renaldo Harrison.  His current dose of insulin as follows.  Diabetes regimen: Novolin N 30 units with breakfast and 20 unit with dinner/supper. Change Novolog 10 units with meals three times days, with breakfast, lunch and dinner. Resume trulicity 3mg  weekly when able to refill.  He missed follow-up appointment with me this morning.   Plan: Adjusted insulin regimen as follows due to steroid. Increase Novolin N 30 units with breakfast and 30 units with supper. NovoLog to 12 units with meals 3 times a day plus following sliding scale.  Moderate Sliding Scale Blood Glucose        Insulin 60-150                     None 151-200                   3 units 201-250                   5 units 251-300                   7 units 301-350                   9 units 351-400                  11 units    >400                       12 units and call provider  Continue Trulicity if he is taking at this time.  Nursing: Please communicate the following change of insulin regimen as above.  Raymond Roniyah Llorens, MD [facility] Endocrinology [facility] [address] [place], [address] [place], [place] [address] Phone # [phone]

## 2023-03-19 NOTE — Telephone Encounter (Signed)
Patient called with changes in insulin regimen as directed by MD. Recommendations were sent to patient's Mychart and RN also left VM for patient.

## 2023-03-19 NOTE — Telephone Encounter (Signed)
Patient brought information to office from [facility] [place] who saw him in the hospital today, 03/19/2023.  Patient states that Dr. Ephriam Jenkins card has his personal cell phone number on the back of it and that Dr. Welford Roche would like to speak with Dr. Erroll Luna concerning the prescription information that Dr. Welford Roche wrote out.  The note as well as Dr. Ephriam Jenkins card is in folder in the front office.

## 2023-03-23 ENCOUNTER — Telehealth: Payer: Self-pay

## 2023-03-23 ENCOUNTER — Other Ambulatory Visit: Payer: Self-pay

## 2023-03-23 ENCOUNTER — Emergency Department (HOSPITAL_COMMUNITY): Payer: 59

## 2023-03-23 ENCOUNTER — Encounter (HOSPITAL_COMMUNITY): Payer: Self-pay

## 2023-03-23 ENCOUNTER — Inpatient Hospital Stay (HOSPITAL_COMMUNITY)
Admission: EM | Admit: 2023-03-23 | Discharge: 2023-03-24 | DRG: 699 | Disposition: A | Discharge status: Other Acute Inpatient Hospital | Payer: 59 | Attending: Family Medicine | Admitting: Family Medicine

## 2023-03-23 DIAGNOSIS — E872 Acidosis, unspecified: Secondary | ICD-10-CM | POA: Diagnosis present

## 2023-03-23 DIAGNOSIS — Z79899 Other long term (current) drug therapy: Secondary | ICD-10-CM

## 2023-03-23 DIAGNOSIS — E113519 Type 2 diabetes mellitus with proliferative diabetic retinopathy with macular edema, unspecified eye: Secondary | ICD-10-CM | POA: Diagnosis present

## 2023-03-23 DIAGNOSIS — T8619 Other complication of kidney transplant: Secondary | ICD-10-CM | POA: Diagnosis not present

## 2023-03-23 DIAGNOSIS — Z79621 Long term (current) use of calcineurin inhibitor: Secondary | ICD-10-CM

## 2023-03-23 DIAGNOSIS — S37019A Minor contusion of unspecified kidney, initial encounter: Secondary | ICD-10-CM | POA: Diagnosis present

## 2023-03-23 DIAGNOSIS — Z7985 Long-term (current) use of injectable non-insulin antidiabetic drugs: Secondary | ICD-10-CM

## 2023-03-23 DIAGNOSIS — Y830 Surgical operation with transplant of whole organ as the cause of abnormal reaction of the patient, or of later complication, without mention of misadventure at the time of the procedure: Secondary | ICD-10-CM | POA: Diagnosis present

## 2023-03-23 DIAGNOSIS — E103519 Type 1 diabetes mellitus with proliferative diabetic retinopathy with macular edema, unspecified eye: Secondary | ICD-10-CM | POA: Diagnosis present

## 2023-03-23 DIAGNOSIS — R34 Anuria and oliguria: Secondary | ICD-10-CM | POA: Diagnosis present

## 2023-03-23 DIAGNOSIS — Z8701 Personal history of pneumonia (recurrent): Secondary | ICD-10-CM

## 2023-03-23 DIAGNOSIS — Z89511 Acquired absence of right leg below knee: Secondary | ICD-10-CM

## 2023-03-23 DIAGNOSIS — Z7902 Long term (current) use of antithrombotics/antiplatelets: Secondary | ICD-10-CM

## 2023-03-23 DIAGNOSIS — D849 Immunodeficiency, unspecified: Secondary | ICD-10-CM | POA: Diagnosis present

## 2023-03-23 DIAGNOSIS — I739 Peripheral vascular disease, unspecified: Secondary | ICD-10-CM | POA: Diagnosis present

## 2023-03-23 DIAGNOSIS — Z794 Long term (current) use of insulin: Secondary | ICD-10-CM

## 2023-03-23 DIAGNOSIS — R042 Hemoptysis: Secondary | ICD-10-CM | POA: Diagnosis present

## 2023-03-23 DIAGNOSIS — Z9889 Other specified postprocedural states: Secondary | ICD-10-CM

## 2023-03-23 DIAGNOSIS — D62 Acute posthemorrhagic anemia: Secondary | ICD-10-CM | POA: Diagnosis present

## 2023-03-23 DIAGNOSIS — Z8249 Family history of ischemic heart disease and other diseases of the circulatory system: Secondary | ICD-10-CM

## 2023-03-23 DIAGNOSIS — E1122 Type 2 diabetes mellitus with diabetic chronic kidney disease: Secondary | ICD-10-CM

## 2023-03-23 DIAGNOSIS — N2889 Other specified disorders of kidney and ureter: Secondary | ICD-10-CM | POA: Diagnosis present

## 2023-03-23 DIAGNOSIS — Z882 Allergy status to sulfonamides status: Secondary | ICD-10-CM

## 2023-03-23 DIAGNOSIS — Z888 Allergy status to other drugs, medicaments and biological substances status: Secondary | ICD-10-CM

## 2023-03-23 DIAGNOSIS — N1832 Chronic kidney disease, stage 3b: Secondary | ICD-10-CM

## 2023-03-23 DIAGNOSIS — R310 Gross hematuria: Secondary | ICD-10-CM | POA: Diagnosis present

## 2023-03-23 DIAGNOSIS — N179 Acute kidney failure, unspecified: Principal | ICD-10-CM | POA: Diagnosis present

## 2023-03-23 DIAGNOSIS — I8001 Phlebitis and thrombophlebitis of superficial vessels of right lower extremity: Secondary | ICD-10-CM | POA: Diagnosis present

## 2023-03-23 DIAGNOSIS — D84821 Immunodeficiency due to drugs: Secondary | ICD-10-CM | POA: Diagnosis present

## 2023-03-23 DIAGNOSIS — Z940 Kidney transplant status: Secondary | ICD-10-CM

## 2023-03-23 DIAGNOSIS — E1051 Type 1 diabetes mellitus with diabetic peripheral angiopathy without gangrene: Secondary | ICD-10-CM | POA: Diagnosis present

## 2023-03-23 DIAGNOSIS — Z833 Family history of diabetes mellitus: Secondary | ICD-10-CM

## 2023-03-23 DIAGNOSIS — K219 Gastro-esophageal reflux disease without esophagitis: Secondary | ICD-10-CM | POA: Diagnosis present

## 2023-03-23 DIAGNOSIS — Z79624 Long term (current) use of inhibitors of nucleotide synthesis: Secondary | ICD-10-CM

## 2023-03-23 DIAGNOSIS — Z881 Allergy status to other antibiotic agents status: Secondary | ICD-10-CM

## 2023-03-23 DIAGNOSIS — I10 Essential (primary) hypertension: Secondary | ICD-10-CM | POA: Diagnosis present

## 2023-03-23 DIAGNOSIS — E875 Hyperkalemia: Secondary | ICD-10-CM | POA: Diagnosis present

## 2023-03-23 LAB — CBC
HCT: 37 % — ABNORMAL LOW (ref 39.0–52.0)
Hemoglobin: 10.9 g/dL — ABNORMAL LOW (ref 13.0–17.0)
MCH: 24.3 pg — ABNORMAL LOW (ref 26.0–34.0)
MCHC: 29.5 g/dL — ABNORMAL LOW (ref 30.0–36.0)
MCV: 82.4 fL (ref 80.0–100.0)
Platelets: 202 10*3/uL (ref 150–400)
RBC: 4.49 MIL/uL (ref 4.22–5.81)
RDW: 16 % — ABNORMAL HIGH (ref 11.5–15.5)
WBC: 6.8 10*3/uL (ref 4.0–10.5)
nRBC: 0 % (ref 0.0–0.2)

## 2023-03-23 LAB — COMPREHENSIVE METABOLIC PANEL
ALT: 24 U/L (ref 0–44)
AST: 57 U/L — ABNORMAL HIGH (ref 15–41)
Albumin: 3.3 g/dL — ABNORMAL LOW (ref 3.5–5.0)
Alkaline Phosphatase: 100 U/L (ref 38–126)
Anion gap: 12 (ref 5–15)
BUN: 50 mg/dL — ABNORMAL HIGH (ref 8–23)
CO2: 20 mmol/L — ABNORMAL LOW (ref 22–32)
Calcium: 9.3 mg/dL (ref 8.9–10.3)
Chloride: 107 mmol/L (ref 98–111)
Creatinine, Ser: 2.85 mg/dL — ABNORMAL HIGH (ref 0.61–1.24)
GFR, Estimated: 24 mL/min — ABNORMAL LOW (ref >60)
Glucose, Bld: 220 mg/dL — ABNORMAL HIGH (ref 70–99)
Potassium: 5.2 mmol/L — ABNORMAL HIGH (ref 3.5–5.1)
Sodium: 139 mmol/L (ref 135–145)
Total Bilirubin: 0.6 mg/dL (ref 0.3–1.2)
Total Protein: 8.8 g/dL — ABNORMAL HIGH (ref 6.5–8.1)

## 2023-03-23 LAB — TYPE AND SCREEN
ABO/RH(D): O POS
Antibody Screen: NEGATIVE

## 2023-03-23 LAB — PROTIME-INR
INR: 1.1 (ref 0.8–1.2)
Prothrombin Time: 14.2 s (ref 11.4–15.2)

## 2023-03-23 LAB — APTT: aPTT: 31 s (ref 24–36)

## 2023-03-23 MED ORDER — ONDANSETRON 4 MG PO TBDP
4.0000 mg | ORAL_TABLET | Freq: Once | ORAL | Status: AC
  Administered 2023-03-23: 4 mg via ORAL
  Filled 2023-03-23: qty 1

## 2023-03-23 NOTE — ED Triage Notes (Signed)
C/o coughing up blood described as bright red clots and bleeding from penis that started 2 hr PTA.  Denies blood thinner usage  Recent kidney biopsy on 10/23 in [place].

## 2023-03-23 NOTE — ED Notes (Signed)
Pt taken to CT.

## 2023-03-23 NOTE — Telephone Encounter (Signed)
Patient given insulin sliding printed copy, per patient having trouble getting into his Mychart.

## 2023-03-23 NOTE — ED Provider Notes (Signed)
[facility] Emergency Department Provider Note MRN:  [id number]  Arrival date & time: 03/24/23     Chief Complaint   Hemoptysis   History of Present Illness   Raymond [name] is a 62 y.o. year-old male with a history of kidney transplant, diabetes, PVD presenting to the ED with chief complaint of hemoptysis.  Today at 630 started having hemoptysis, 5 different episodes of coughing up red blood.  Has also had some nausea vomiting but no blood in the vomit.  Has also noticed blood in the urine this evening.  Had a renal biopsy 2 or 3 days ago in [place].  Denies fever, no abdominal pain, minimal soreness to the biopsy site.  Review of Systems  A thorough review of systems was obtained and all systems are negative except as noted in the HPI and PMH.   Patient's Health History    Past Medical History:  Diagnosis Date   Diabetes (HCC)    Diabetes mellitus without complication (HCC)    type 2   ESRD (end stage renal disease) (HCC)    03/09/2015- patient had a kidney transplant   Hypertension    Nausea vomiting and diarrhea 12/10/2015   Peripheral vascular disease (HCC)    Renal disorder    Renal insufficiency     Past Surgical History:  Procedure Laterality Date   ABDOMINAL AORTOGRAM W/LOWER EXTREMITY N/A 09/11/2021   Procedure: ABDOMINAL AORTOGRAM W/LOWER EXTREMITY;  Surgeon: Iran Ouch, MD;  [facility] INVASIVE CV LAB;  Service: Cardiovascular;  Laterality: N/A;   AMPUTATION Right 09/25/2021   Procedure: RIGHT TRANSMETATARSAL AMPUTATION AND APPLY TISSUE GRAFT;  Surgeon: Nadara Mustard, MD;  Location: [facility] OR;  Service: Orthopedics;  Laterality: Right;   AMPUTATION Right 11/16/2021   Procedure: RIGHT AMPUTATION BELOW KNEE WITH WOUND VAC APPLICATION;  Surgeon: Nadara Mustard, MD;  Location: [facility] OR;  Service: Orthopedics;  Laterality: Right;   BONE BIOPSY Right 03/06/2021   Procedure: BONE BIOPSY;  Surgeon: Vivi Barrack, DPM;  Location: [facility]  ORS;  Service: Podiatry;  Laterality: Right;   CENTRAL VENOUS CATHETER INSERTION Right 01/20/2020   Procedure: INSERTION CENTRAL LINE ADULT; Tunneled central line;  Surgeon: Lucretia Roers, MD;  Location: [facility] ORS;  Service: General;  Laterality: Right;   COLONOSCOPY N/A 12/05/2014   ONG:EXBMWUXL external and internal hemorrhoid/mild diverticulosis/11 polyps removed   ESOPHAGOGASTRODUODENOSCOPY N/A 12/05/2014   KGM:WNUU duodenitis   GRAFT APPLICATION Right 03/06/2021   Procedure: GRAFT APPLICATION;  Surgeon: Vivi Barrack, DPM;  Location: [facility] ORS;  Service: Podiatry;  Laterality: Right;   INCISION AND DRAINAGE OF WOUND Right 08/15/2021   Procedure: IRRIGATION AND DEBRIDEMENT WOUND;  Surgeon: Asencion Islam, DPM;  Location: [facility] ORS;  Service: Podiatry;  Laterality: Right;  need to make card not  a irrigation and debridement card   IR FLUORO GUIDE CV LINE RIGHT  03/01/2019   IR PERC TUN PERIT CATH WO PORT S&I /IMAG  06/27/2021   IR REMOVAL TUN CV CATH W/O FL  05/10/2019   IR REMOVAL TUN CV CATH W/O FL  02/29/2020   IR REMOVAL TUN CV CATH W/O FL  07/19/2021   IR US GUIDE VASC ACCESS RIGHT  03/01/2019   IR US GUIDE VASC ACCESS RIGHT  06/27/2021   IRRIGATION AND DEBRIDEMENT ELBOW Left 06/24/2021   Procedure: IRRIGATION AND DEBRIDEMENT ELBOW;  Surgeon: Lyndle Herrlich, MD;  Location: [facility] ORS;  Service: Orthopedics;  Laterality: Left;   KIDNEY TRANSPLANT  03/27/2015  Penile Pump Insertion     PERIPHERAL VASCULAR ATHERECTOMY  09/11/2021   Procedure: PERIPHERAL VASCULAR ATHERECTOMY;  Surgeon: Iran Ouch, MD;  [facility] LAB;  Service: Cardiovascular;;  Shockwave Lithotripsy   TEE WITHOUT CARDIOVERSION N/A 01/17/2020   Procedure: TRANSESOPHAGEAL ECHOCARDIOGRAM (TEE) WITH PROPOFOL;  Surgeon: Antoine Poche, MD;  Location: [facility] ENDO SUITE;  Service: Endoscopy;  Laterality: N/A;   WOUND DEBRIDEMENT Right 03/06/2021   Procedure: DEBRIDEMENT WOUND;  Surgeon: Vivi Barrack,  DPM;  Location: [facility] ORS;  Service: Podiatry;  Laterality: Right;    Family History  Problem Relation Age of Onset   Diabetes Mother    Heart disease Father    Colon cancer Neg Hx     Social History   Socioeconomic History   Marital status: Married    Spouse name: Not on file   Number of children: Not on file   Years of education: Not on file   Highest education level: Not on file  Occupational History   Not on file  Tobacco Use   Smoking status: Never   Smokeless tobacco: Never  Vaping Use   Vaping status: Never Used  Substance and Sexual Activity   Alcohol use: No    Alcohol/week: 0.0 standard drinks of alcohol   Drug use: No   Sexual activity: Yes  Other Topics Concern   Not on file  Social History Narrative   ** Merged History Encounter **       Social Determinants of Health   Financial Resource Strain: Not on file  Food Insecurity: Low Risk  (01/30/2023)   Received from [facility]   Hunger Vital Sign    Worried About Running Out of Food in the Last Year: Never true    Ran Out of Food in the Last Year: Never true  Transportation Needs: No Transportation Needs (01/30/2023)   Received from [facility]    In the past 12 months, has lack of reliable transportation kept you from medical appointments, meetings, work or from getting things needed for daily living? : No  Physical Activity: Not on file  Stress: Not on file  Social Connections: Not on file  Intimate Partner Violence: Not on file     Physical Exam   Vitals:   03/24/23 0249 03/24/23 0355  BP: (!) 178/94   Pulse: 71   Resp: 19   Temp:  98 F (36.7 C)  SpO2: 95%     CONSTITUTIONAL: Well-appearing, NAD NEURO/PSYCH:  Alert and oriented x 3, no focal deficits EYES:  eyes equal and reactive ENT/NECK:  no LAD, no JVD CARDIO: Regular rate, well-perfused, normal S1 and S2 PULM:  CTAB no wheezing or rhonchi GI/GU:  non-distended, non-tender MSK/SPINE:  No gross deformities, no  edema SKIN:  no rash, atraumatic   *Additional and/or pertinent findings included in MDM below  Diagnostic and Interventional Summary    EKG Interpretation Date/Time:  Monday March 23 2023 23:46:23 EDT Ventricular Rate:  78 PR Interval:  179 QRS Duration:  90 QT Interval:  381 QTC Calculation: 434 R Axis:   36  Text Interpretation: Sinus rhythm Consider left atrial enlargement Baseline wander in lead(s) I III aVL Confirmed by Kennis Carina [id number]) on 03/23/2023 11:48:56 PM       Labs Reviewed  COMPREHENSIVE METABOLIC PANEL - Abnormal; Notable for the following components:      Result Value   Potassium 5.2 (*)    CO2 20 (*)  Glucose, Bld 220 (*)    BUN 50 (*)    Creatinine, Ser 2.85 (*)    Total Protein 8.8 (*)    Albumin 3.3 (*)    AST 57 (*)    GFR, Estimated 24 (*)    All other components within normal limits  CBC - Abnormal; Notable for the following components:   Hemoglobin 10.9 (*)    HCT 37.0 (*)    MCH 24.3 (*)    MCHC 29.5 (*)    RDW 16.0 (*)    All other components within normal limits  URINALYSIS, ROUTINE W REFLEX MICROSCOPIC - Abnormal; Notable for the following components:   Color, Urine RED (*)    APPearance CLOUDY (*)    Specific Gravity, Urine 1.004 (*)    Glucose, UA 50 (*)    Hgb urine dipstick LARGE (*)    Protein, ur 100 (*)    Leukocytes,Ua LARGE (*)    Bacteria, UA FEW (*)    All other components within normal limits  PROTIME-INR  APTT  TYPE AND SCREEN    CT CHEST ABDOMEN PELVIS WO CONTRAST  Final Result    DG Chest 2 View  Final Result      Medications  ondansetron (ZOFRAN-ODT) disintegrating tablet 4 mg (4 mg Oral Given 03/23/23 2340)     Procedures  /  Critical Care Procedures  ED Course and Medical Decision Making  Initial Impression and Ddx Recent admission for pneumonia, acute kidney injury, per chart review the biopsy was done to evaluate for BKV infection of the transplanted kidney.  Creatinine increased to 3  during most recent admission but had improved to 2.0.  Possibly patient is having hemoptysis related to the recent pneumonia, hematuria related to the recent biopsy.  No shortness of breath or hypoxia or tachycardia or leg pain or swelling, overall low concern for PE.  UTI also considered.  Underlying coagulopathy considered as well.  Past medical/surgical history that increases complexity of ED encounter: Kidney transplant  Interpretation of Diagnostics I personally reviewed the laboratory assessment and my interpretation is as follows: Elevated BUN/creatinine, increased from recent baseline of 2.0  CT reveals subcapsular hematoma  Patient Reassessment and Ultimate Disposition/Management     Case discussed with Dr. Elby Showers of interventional radiology to gain better understanding of the significance of this subcapsular hematoma.  Dr. Elby Showers recommending consultation and possibly admission to patient's transplant hospital.  I spoke with Dr. Jacques Navy, nephrologist at [facility] in [place].  Unfortunately no beds available within the system at [facility].  Plan is for admission here at [facility], trending of hemoglobin and renal function, involving nephrology if needed here, transfer to [facility] when able.  Patient has had no hemoptysis for the past few hours.  CT of the lungs appear normal.  Would consider VQ scan if patient developed continued hemoptysis or shortness of breath.  Patient management required discussion with the following services or consulting groups:  Hospitalist Service and Nephrology  Complexity of Problems Addressed Acute illness or injury that poses threat of life of bodily function  Additional Data Reviewed and Analyzed Further history obtained from: Further history from spouse/family member  Additional Factors Impacting ED Encounter Risk Consideration of hospitalization  Elmer Sow. Pilar Plate, MD [facility] Emergency Medicine [facility]  Health [email or web address]  Final Clinical Impressions(s) / ED Diagnoses     ICD-10-CM   1. AKI (acute kidney injury) (HCC)  N17.9     2. Subcapsular hematoma of kidney transplant  T86.19    S37.019A       ED Discharge Orders     None        Discharge Instructions Discussed with and Provided to Patient:   Discharge Instructions   None      Sabas Sous, MD 03/24/23 [id number]

## 2023-03-24 ENCOUNTER — Encounter (HOSPITAL_COMMUNITY): Payer: Self-pay | Admitting: Family Medicine

## 2023-03-24 ENCOUNTER — Inpatient Hospital Stay (HOSPITAL_COMMUNITY): Payer: 59

## 2023-03-24 DIAGNOSIS — E875 Hyperkalemia: Secondary | ICD-10-CM

## 2023-03-24 DIAGNOSIS — Z8249 Family history of ischemic heart disease and other diseases of the circulatory system: Secondary | ICD-10-CM | POA: Diagnosis not present

## 2023-03-24 DIAGNOSIS — R34 Anuria and oliguria: Secondary | ICD-10-CM | POA: Diagnosis present

## 2023-03-24 DIAGNOSIS — Z79624 Long term (current) use of inhibitors of nucleotide synthesis: Secondary | ICD-10-CM | POA: Diagnosis not present

## 2023-03-24 DIAGNOSIS — Z79899 Other long term (current) drug therapy: Secondary | ICD-10-CM | POA: Diagnosis not present

## 2023-03-24 DIAGNOSIS — Z87898 Personal history of other specified conditions: Secondary | ICD-10-CM | POA: Diagnosis not present

## 2023-03-24 DIAGNOSIS — Z794 Long term (current) use of insulin: Secondary | ICD-10-CM | POA: Diagnosis not present

## 2023-03-24 DIAGNOSIS — N179 Acute kidney failure, unspecified: Secondary | ICD-10-CM | POA: Diagnosis present

## 2023-03-24 DIAGNOSIS — Z881 Allergy status to other antibiotic agents status: Secondary | ICD-10-CM | POA: Diagnosis not present

## 2023-03-24 DIAGNOSIS — Z2821 Immunization not carried out because of patient refusal: Secondary | ICD-10-CM | POA: Insufficient documentation

## 2023-03-24 DIAGNOSIS — N2889 Other specified disorders of kidney and ureter: Secondary | ICD-10-CM | POA: Diagnosis present

## 2023-03-24 DIAGNOSIS — I10 Essential (primary) hypertension: Secondary | ICD-10-CM | POA: Diagnosis present

## 2023-03-24 DIAGNOSIS — Z9889 Other specified postprocedural states: Secondary | ICD-10-CM

## 2023-03-24 DIAGNOSIS — D84821 Immunodeficiency due to drugs: Secondary | ICD-10-CM | POA: Diagnosis present

## 2023-03-24 DIAGNOSIS — R042 Hemoptysis: Secondary | ICD-10-CM | POA: Diagnosis present

## 2023-03-24 DIAGNOSIS — Z882 Allergy status to sulfonamides status: Secondary | ICD-10-CM | POA: Diagnosis not present

## 2023-03-24 DIAGNOSIS — J3489 Other specified disorders of nose and nasal sinuses: Secondary | ICD-10-CM | POA: Insufficient documentation

## 2023-03-24 DIAGNOSIS — E6609 Other obesity due to excess calories: Secondary | ICD-10-CM | POA: Insufficient documentation

## 2023-03-24 DIAGNOSIS — E103519 Type 1 diabetes mellitus with proliferative diabetic retinopathy with macular edema, unspecified eye: Secondary | ICD-10-CM | POA: Diagnosis present

## 2023-03-24 DIAGNOSIS — S37019A Minor contusion of unspecified kidney, initial encounter: Secondary | ICD-10-CM | POA: Diagnosis present

## 2023-03-24 DIAGNOSIS — Z940 Kidney transplant status: Secondary | ICD-10-CM

## 2023-03-24 DIAGNOSIS — Z79621 Long term (current) use of calcineurin inhibitor: Secondary | ICD-10-CM | POA: Diagnosis not present

## 2023-03-24 DIAGNOSIS — T8619 Other complication of kidney transplant: Secondary | ICD-10-CM | POA: Diagnosis present

## 2023-03-24 DIAGNOSIS — K219 Gastro-esophageal reflux disease without esophagitis: Secondary | ICD-10-CM | POA: Diagnosis present

## 2023-03-24 DIAGNOSIS — Z888 Allergy status to other drugs, medicaments and biological substances status: Secondary | ICD-10-CM | POA: Diagnosis not present

## 2023-03-24 DIAGNOSIS — D62 Acute posthemorrhagic anemia: Secondary | ICD-10-CM | POA: Diagnosis present

## 2023-03-24 DIAGNOSIS — E1051 Type 1 diabetes mellitus with diabetic peripheral angiopathy without gangrene: Secondary | ICD-10-CM | POA: Diagnosis present

## 2023-03-24 DIAGNOSIS — Z23 Encounter for immunization: Secondary | ICD-10-CM | POA: Insufficient documentation

## 2023-03-24 DIAGNOSIS — Z89511 Acquired absence of right leg below knee: Secondary | ICD-10-CM | POA: Diagnosis not present

## 2023-03-24 DIAGNOSIS — D849 Immunodeficiency, unspecified: Secondary | ICD-10-CM

## 2023-03-24 DIAGNOSIS — Y830 Surgical operation with transplant of whole organ as the cause of abnormal reaction of the patient, or of later complication, without mention of misadventure at the time of the procedure: Secondary | ICD-10-CM | POA: Diagnosis present

## 2023-03-24 DIAGNOSIS — E66812 Obesity, class 2: Secondary | ICD-10-CM | POA: Insufficient documentation

## 2023-03-24 DIAGNOSIS — Z7985 Long-term (current) use of injectable non-insulin antidiabetic drugs: Secondary | ICD-10-CM | POA: Diagnosis not present

## 2023-03-24 DIAGNOSIS — E872 Acidosis, unspecified: Secondary | ICD-10-CM | POA: Diagnosis present

## 2023-03-24 DIAGNOSIS — Z7902 Long term (current) use of antithrombotics/antiplatelets: Secondary | ICD-10-CM | POA: Diagnosis not present

## 2023-03-24 LAB — CBC
HCT: 33.7 % — ABNORMAL LOW (ref 39.0–52.0)
HCT: 32.7 % — ABNORMAL LOW (ref 39.0–52.0)
Hemoglobin: 9.8 g/dL — ABNORMAL LOW (ref 13.0–17.0)
Hemoglobin: 9.8 g/dL — ABNORMAL LOW (ref 13.0–17.0)
MCH: 24.1 pg — ABNORMAL LOW (ref 26.0–34.0)
MCH: 24.6 pg — ABNORMAL LOW (ref 26.0–34.0)
MCHC: 29.1 g/dL — ABNORMAL LOW (ref 30.0–36.0)
MCHC: 30 g/dL (ref 30.0–36.0)
MCV: 83 fL (ref 80.0–100.0)
MCV: 82 fL (ref 80.0–100.0)
Platelets: 174 10*3/uL (ref 150–400)
Platelets: 175 10*3/uL (ref 150–400)
RBC: 4.06 MIL/uL — ABNORMAL LOW (ref 4.22–5.81)
RBC: 3.99 MIL/uL — ABNORMAL LOW (ref 4.22–5.81)
RDW: 16.1 % — ABNORMAL HIGH (ref 11.5–15.5)
RDW: 16.3 % — ABNORMAL HIGH (ref 11.5–15.5)
WBC: 9.2 10*3/uL (ref 4.0–10.5)
WBC: 13.7 10*3/uL — ABNORMAL HIGH (ref 4.0–10.5)
nRBC: 0 % (ref 0.0–0.2)
nRBC: 0 % (ref 0.0–0.2)

## 2023-03-24 LAB — GLUCOSE, CAPILLARY
Glucose-Capillary: 145 mg/dL — ABNORMAL HIGH (ref 70–99)
Glucose-Capillary: 165 mg/dL — ABNORMAL HIGH (ref 70–99)
Glucose-Capillary: 169 mg/dL — ABNORMAL HIGH (ref 70–99)

## 2023-03-24 LAB — RENAL FUNCTION PANEL
Albumin: 2.8 g/dL — ABNORMAL LOW (ref 3.5–5.0)
Anion gap: 13 (ref 5–15)
BUN: 62 mg/dL — ABNORMAL HIGH (ref 8–23)
CO2: 18 mmol/L — ABNORMAL LOW (ref 22–32)
Calcium: 8.8 mg/dL — ABNORMAL LOW (ref 8.9–10.3)
Chloride: 106 mmol/L (ref 98–111)
Creatinine, Ser: 5.02 mg/dL — ABNORMAL HIGH (ref 0.61–1.24)
GFR, Estimated: 12 mL/min — ABNORMAL LOW (ref >60)
Glucose, Bld: 200 mg/dL — ABNORMAL HIGH (ref 70–99)
Phosphorus: 4 mg/dL (ref 2.5–4.6)
Potassium: 5.1 mmol/L (ref 3.5–5.1)
Sodium: 137 mmol/L (ref 135–145)

## 2023-03-24 LAB — HEMOGLOBIN AND HEMATOCRIT, BLOOD
HCT: 33.1 % — ABNORMAL LOW (ref 39.0–52.0)
Hemoglobin: 9.9 g/dL — ABNORMAL LOW (ref 13.0–17.0)

## 2023-03-24 LAB — URINALYSIS, ROUTINE W REFLEX MICROSCOPIC
Bilirubin Urine: NEGATIVE
Glucose, UA: 50 mg/dL — AB
Ketones, ur: NEGATIVE mg/dL
Nitrite: NEGATIVE
Protein, ur: 100 mg/dL — AB
RBC / HPF: >50 RBC/hpf (ref 0–5)
Specific Gravity, Urine: 1.004 — ABNORMAL LOW (ref 1.005–1.030)
Trans Epithel, UA: 3
WBC, UA: >50 WBC/hpf (ref 0–5)
pH: 6 (ref 5.0–8.0)

## 2023-03-24 LAB — BASIC METABOLIC PANEL
Anion gap: 11 (ref 5–15)
BUN: 57 mg/dL — ABNORMAL HIGH (ref 8–23)
CO2: 19 mmol/L — ABNORMAL LOW (ref 22–32)
Calcium: 9 mg/dL (ref 8.9–10.3)
Chloride: 107 mmol/L (ref 98–111)
Creatinine, Ser: 3.9 mg/dL — ABNORMAL HIGH (ref 0.61–1.24)
GFR, Estimated: 17 mL/min — ABNORMAL LOW (ref >60)
Glucose, Bld: 158 mg/dL — ABNORMAL HIGH (ref 70–99)
Potassium: 5.3 mmol/L — ABNORMAL HIGH (ref 3.5–5.1)
Sodium: 137 mmol/L (ref 135–145)

## 2023-03-24 MED ORDER — LATANOPROST 0.005 % OP SOLN: 1.0000 [drp] | Freq: Every day | OPHTHALMIC | Status: DC

## 2023-03-24 MED ORDER — FENTANYL CITRATE PF 50 MCG/ML IJ SOSY: 12.5000 ug | PREFILLED_SYRINGE | INTRAMUSCULAR | Status: DC | PRN

## 2023-03-24 MED ORDER — AZATHIOPRINE 50 MG PO TABS
150.0000 mg | ORAL_TABLET | Freq: Every day | ORAL | Status: DC
  Administered 2023-03-24: 150 mg via ORAL
  Filled 2023-03-24 (×2): qty 3

## 2023-03-24 MED ORDER — HYDRALAZINE HCL 25 MG PO TABS
25.0000 mg | ORAL_TABLET | Freq: Three times a day (TID) | ORAL | Status: DC
  Filled 2023-03-24: qty 1

## 2023-03-24 MED ORDER — TACROLIMUS 1 MG PO CAPS
4.0000 mg | ORAL_CAPSULE | Freq: Two times a day (BID) | ORAL | Status: DC
  Filled 2023-03-24 (×3): qty 4

## 2023-03-24 MED ORDER — OXYCODONE HCL 5 MG PO TABS: 5.0000 mg | ORAL_TABLET | Freq: Four times a day (QID) | ORAL | Status: DC | PRN

## 2023-03-24 MED ORDER — ATORVASTATIN CALCIUM 10 MG PO TABS: 10.0000 mg | ORAL_TABLET | Freq: Every evening | ORAL | Status: DC

## 2023-03-24 MED ORDER — TACROLIMUS 1 MG PO CAPS
3.0000 mg | ORAL_CAPSULE | Freq: Two times a day (BID) | ORAL | Status: DC
  Administered 2023-03-24: 3 mg via ORAL
  Filled 2023-03-24 (×3): qty 3

## 2023-03-24 MED ORDER — ONDANSETRON HCL 4 MG PO TABS: 4.0000 mg | ORAL_TABLET | Freq: Four times a day (QID) | ORAL | Status: DC | PRN

## 2023-03-24 MED ORDER — INSULIN ASPART 100 UNIT/ML IJ SOLN
0.0000 [IU] | Freq: Three times a day (TID) | INTRAMUSCULAR | Status: DC
  Administered 2023-03-24: 1 [IU] via SUBCUTANEOUS
  Administered 2023-03-24: 2 [IU] via SUBCUTANEOUS

## 2023-03-24 MED ORDER — HYDRALAZINE HCL 20 MG/ML IJ SOLN
10.0000 mg | INTRAMUSCULAR | Status: DC | PRN
  Administered 2023-03-24 (×2): 10 mg via INTRAVENOUS
  Filled 2023-03-24 (×2): qty 1

## 2023-03-24 MED ORDER — DORZOLAMIDE HCL-TIMOLOL MAL 2-0.5 % OP SOLN
1.0000 [drp] | Freq: Two times a day (BID) | OPHTHALMIC | Status: DC
Start: 2023-03-24 — End: 2023-03-24
  Filled 2023-03-24: qty 10

## 2023-03-24 MED ORDER — LACTATED RINGERS IV SOLN: INTRAVENOUS | Status: DC

## 2023-03-24 MED ORDER — ACETAMINOPHEN 650 MG RE SUPP: 650.0000 mg | Freq: Four times a day (QID) | RECTAL | Status: DC | PRN

## 2023-03-24 MED ORDER — MYCOPHENOLATE SODIUM 180 MG PO TBEC
720.0000 mg | DELAYED_RELEASE_TABLET | Freq: Two times a day (BID) | ORAL | Status: DC
  Administered 2023-03-24: 720 mg via ORAL
  Filled 2023-03-24 (×3): qty 4

## 2023-03-24 MED ORDER — CLONIDINE HCL 0.2 MG/24HR TD PTWK: 0.3000 mg | MEDICATED_PATCH | TRANSDERMAL | Status: DC

## 2023-03-24 MED ORDER — LACTATED RINGERS IV BOLUS: 1000.0000 mL | Freq: Once | INTRAVENOUS | Status: DC

## 2023-03-24 MED ORDER — GABAPENTIN 300 MG PO CAPS: 300.0000 mg | ORAL_CAPSULE | Freq: Three times a day (TID) | ORAL | Status: DC | PRN

## 2023-03-24 MED ORDER — INSULIN NPH (HUMAN) (ISOPHANE) 100 UNIT/ML ~~LOC~~ SUSP
12.0000 [IU] | Freq: Two times a day (BID) | SUBCUTANEOUS | Status: DC
  Administered 2023-03-24: 12 [IU] via SUBCUTANEOUS
  Filled 2023-03-24: qty 10

## 2023-03-24 MED ORDER — SODIUM BICARBONATE 650 MG PO TABS
1300.0000 mg | ORAL_TABLET | Freq: Three times a day (TID) | ORAL | Status: DC
  Filled 2023-03-24: qty 2

## 2023-03-24 MED ORDER — ONDANSETRON HCL 4 MG/2ML IJ SOLN
4.0000 mg | Freq: Four times a day (QID) | INTRAMUSCULAR | Status: DC | PRN
  Administered 2023-03-24: 4 mg via INTRAVENOUS
  Filled 2023-03-24: qty 2

## 2023-03-24 MED ORDER — BRIMONIDINE TARTRATE 0.2 % OP SOLN
1.0000 [drp] | Freq: Three times a day (TID) | OPHTHALMIC | Status: DC
  Administered 2023-03-24: 1 [drp] via OPHTHALMIC
  Filled 2023-03-24: qty 5

## 2023-03-24 MED ORDER — SODIUM ZIRCONIUM CYCLOSILICATE 10 G PO PACK
10.0000 g | PACK | Freq: Three times a day (TID) | ORAL | Status: DC
Start: 2023-03-24 — End: 2023-03-24
  Administered 2023-03-24: 10 g via ORAL
  Filled 2023-03-24 (×2): qty 1

## 2023-03-24 MED ORDER — ACETAMINOPHEN 325 MG PO TABS
650.0000 mg | ORAL_TABLET | Freq: Four times a day (QID) | ORAL | Status: DC | PRN
  Administered 2023-03-24: 650 mg via ORAL
  Filled 2023-03-24: qty 2

## 2023-03-24 MED ORDER — PROCHLORPERAZINE EDISYLATE 10 MG/2ML IJ SOLN
10.0000 mg | INTRAMUSCULAR | Status: DC | PRN
  Administered 2023-03-24: 10 mg via INTRAVENOUS
  Filled 2023-03-24: qty 2

## 2023-03-24 NOTE — Assessment & Plan Note (Signed)
Cholesterol levels are stable. Continue current medications.  

## 2023-03-24 NOTE — Progress Notes (Signed)
03/24/2023 4:39 PM  [facility] Progress Note  I sent STAT lab results and discussed with Dr. Welford Roche.  Pt is being transferred in next few mins to [facility].    Maryln Manuel, MD

## 2023-03-24 NOTE — Discharge Summary (Addendum)
Physician Discharge Summary  Raymond [name] GMW:[id number] DOB: 1960/09/09 DOA: 03/23/2023  PCP: [name], FNP Nephrologist: Dr. Welford Roche  Admit date: 03/23/2023 Discharge date: 03/24/2023  Disposition:  TRANSFER TO [name]   Discharge Condition: STABLE   CODE STATUS: FULL DIET: RENAL / CARB MODIFIED    Brief Hospitalization Summary: Please see all hospital notes, images, labs for full details of the hospitalization. This was a carryover admission accepted by another provider. He was already roomed in a hospital bed when I assumed care of patient.    62 year old gentleman with history of diabetes mellitus with complications of diabetic retinopathy and peripheral vascular disease status post right BKA, status post renal transplant in 2016 had a recent transplant biopsy done on 03/18/2023 by Dr. Perlie Mayo where he received 6 18-gauge core biopsies of the transplanted kidney.  He presented to the [facility], ED on 03/23/2023 complaining of hemoptysis coughing up bright red blood clots and bleeding from the penis to started 2 hours prior to arrival.  He reports that he does not use blood thinners.  His wife decided to bring him to the nearest ED versus driving to [place].  He also reported some nausea and vomiting.  He denied having fever or chills.  He reports some tenderness at the biopsy site.  He says he did not not feel like the biopsy went well due to having so many core biopsies taken at 1 time.  He had a CT scan done in the ED of the chest abdomen and pelvis and the CT revealed a large subcapsular hematoma of 3.8 cm.  The case was discussed with Dr. Elby Showers of interventional radiology and his recommendation was for patient to be transferred to his transplant hospital.  There apparently were no beds available at [facility] at the time.  The patient was subsequently admitted to [facility] with a plan to consult nephrology to see him.  After discussing with  nephrology it was strongly advised that patient transferred to transplant center.  I called the transfer center for [facility] and spoke with Dr. Shayne Alken who accepted patient for urgent transfer.  He recommended starting IV fluids and obtaining a stat renal ultrasound and follow hemoglobin.  We are waiting for a bed to become available for transfer to [facility].  Hospital course  Pt has been anuric and hyperkalemic.  We started him on gentle IV fluid hydration while waiting on bed for him at [facility].  We ordered renal US and waiting for it to be completed.  We ordered blood cultures x 2 when he started becoming febrile with temp 100.8.  The [place] results came back concerning for little to no flow seen within the upper half of the transplant kidney and little to no venous flow throughout the kidney likely due to extrinsic compression from subcapsular hematoma.  The findings are concerning for imminent transplant failure.  I immediately reached out to consulting nephrologist Dr. Crista Elliot to discuss what options we had for treatment here and I called [facility] transfer center to update nephrologist about the results.  Dr. Ronalee Belts called patient's nephrologist Dr. Renaldo Harrison.  Dr. Welford Roche was able to expedite patient getting a bed at [facility].  The nephrology transplant team requested a STAT CBC and Renal function panel which I ordered.  I requested that [facility] pick him up as soon as possible.  I updated patient and wife at bedside about transfer and [place] results.  I have reached  out to Dr. Welford Roche and have been in communication with Dr. Ronalee Belts and the [facility] transfer center.    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   Vancomycin Rash   Patient having hives to either vancomycin or flagyl        Medication List     STOP taking these medications    clopidogrel 75 MG tablet Commonly known as: PLAVIX   doxazosin 4 MG tablet Commonly known as: CARDURA   predniSONE 10 MG tablet Commonly known as: DELTASONE       TAKE these medications    Accu-Chek Guide Me w/Device Kit 1 Piece by Does not apply route as directed.   atorvastatin 10 MG tablet Commonly known as: LIPITOR Take 1 tablet (10 mg total) by mouth daily.   Azathioprine 75 MG Tabs Take 2 tablets by mouth daily.   blood glucose meter kit and supplies Kit Dispense based on patient and insurance preference. Use up to four times daily as directed. (FOR ICD-9 250.00, 250.01).   brimonidine 0.2 % ophthalmic solution Commonly known as: ALPHAGAN Place 1 drop into both eyes 3 (three) times daily.   cloNIDine 0.3 mg/24hr patch Commonly known as: CATAPRES - Dosed in mg/24 hr Place 1 patch (0.3 mg total) onto the skin once a week. What changed: additional instructions   Dexcom G7 Receiver Devi Use to check blood sugar 3 times a day. Dx code e11.65   Dexcom G7 Sensor Misc USE TO CHECK GLUCOSE THREE TIMES DAILY AS DIRECTED, CHANGE SENSOR EVERY 10 DAYS.   dorzolamide-timolol 2-0.5 % ophthalmic solution Commonly known as:  COSOPT Place 1 drop into both eyes 2 (two) times daily.   furosemide 20 MG tablet Commonly known as: LASIX 20 mg 2 (two) times daily.   gabapentin 300 MG capsule Commonly known as: NEURONTIN Take 3 capsules (900 mg total) by mouth 3 (three) times daily. 3 times a day   glucose blood test strip Commonly known as: Accu-Chek Guide Use as instructed 4 x daily. E11.65   hydrALAZINE 25 MG tablet Commonly known as: APRESOLINE Take 1 tablet (25 mg total) by mouth 3 (three) times daily.   latanoprost 0.005 % ophthalmic solution Commonly known as: XALATAN Place 1 drop into both eyes at bedtime.   multivitamin capsule Take 1 capsule by mouth daily.   mupirocin ointment 2 % Commonly known as: BACTROBAN Apply 1 application. topically 2 (two) times daily. For wound care   mycophenolate 360 MG Tbec EC tablet Commonly known as: MYFORTIC Take 720 mg by mouth 2 (two) times daily.   NovoLIN N FlexPen 100 UNIT/ML FlexPen Generic drug: Insulin NPH (  Human) (Isophane) INJECT 30 UNITS SUBCUTANEOUSLY IN THE MORNING AND 20 UNITS with SUPPER.   NovoLOG FlexPen 100 UNIT/ML FlexPen Generic drug: insulin aspart INJECT 8 UNITS SUBCUTANEOUSLY with Dinner.   nystatin 100000 UNIT/ML suspension Commonly known as: MYCOSTATIN Take 5 mLs by mouth 4 (four) times daily.   ondansetron 4 MG tablet Commonly known as: ZOFRAN Take 1 tablet (4 mg total) by mouth every 6 (six) hours as needed for nausea.   sodium bicarbonate 650 MG tablet Take 1,300 mg by mouth 3 (three) times daily.   tacrolimus 1 MG capsule Commonly known as: PROGRAF Take 4 capsules (4 mg total) by mouth 2 (two) times daily.   Trulicity 3 MG/0.5ML Soaj Generic drug: Dulaglutide Inject 3 mg as directed once a week.   Vitamin D (Ergocalciferol) 1.25 MG (50000 UNIT) Caps capsule Commonly known as: DRISDOL Take 50,000 Units by mouth See admin instructions. 15th of the month        Allergies  Allergen Reactions   Bactrim  [Sulfamethoxazole-Trimethoprim]     Runs pt's sugar and phosphorus   Doxycycline Other (See Comments)    Runs up pt's blood sugar and phosphorus   Flagyl [Metronidazole] Rash    Patient having hives to either the vancomycin or flagyl (both infusing together).   Vancomycin Rash    Patient having hives to either vancomycin or flagyl   Allergies as of 03/24/2023       Reactions   Bactrim [sulfamethoxazole-trimethoprim]    Runs pt's sugar and phosphorus   Doxycycline Other (See Comments)   Runs up pt's blood sugar and phosphorus   Flagyl [metronidazole] Rash   Patient having hives to either the vancomycin or flagyl (both infusing together).   Vancomycin Rash   Patient having hives to either vancomycin or flagyl        Medication List     STOP taking these medications    clopidogrel 75 MG tablet Commonly known as: PLAVIX   doxazosin 4 MG tablet Commonly known as: CARDURA   predniSONE 10 MG tablet Commonly known as: DELTASONE       TAKE these medications    Accu-Chek Guide Me w/Device Kit 1 Piece by Does not apply route as directed.   atorvastatin 10 MG tablet Commonly known as: LIPITOR Take 1 tablet (10 mg total) by mouth daily.   Azathioprine 75 MG Tabs Take 2 tablets by mouth daily.   blood glucose meter kit and supplies Kit Dispense based on patient and insurance preference. Use up to four times daily as directed. (FOR ICD-9 250.00, 250.01).   brimonidine 0.2 % ophthalmic solution Commonly known as: ALPHAGAN Place 1 drop into both eyes 3 (three) times daily.   cloNIDine 0.3 mg/24hr patch Commonly known as: CATAPRES - Dosed in mg/24 hr Place 1 patch (0.3 mg total) onto the skin once a week. What changed: additional instructions   Dexcom G7 Receiver Devi Use to check blood sugar 3 times a day. Dx code e11.65   Dexcom G7 Sensor Misc USE TO CHECK GLUCOSE THREE TIMES DAILY AS DIRECTED, CHANGE SENSOR EVERY 10 DAYS.   dorzolamide-timolol 2-0.5 %  ophthalmic solution Commonly known as: COSOPT Place 1 drop into both eyes 2 (two) times daily.   furosemide 20 MG tablet Commonly known as: LASIX 20 mg 2 (two) times daily.   gabapentin 300 MG capsule Commonly known as: NEURONTIN Take 3 capsules (900 mg total) by mouth 3 (three) times daily. 3 times a day   glucose blood test strip  Commonly known as: Accu-Chek Guide Use as instructed 4 x daily. E11.65   hydrALAZINE 25 MG tablet Commonly known as: APRESOLINE Take 1 tablet (25 mg total) by mouth 3 (three) times daily.   latanoprost 0.005 % ophthalmic solution Commonly known as: XALATAN Place 1 drop into both eyes at bedtime.   multivitamin capsule Take 1 capsule by mouth daily.   mupirocin ointment 2 % Commonly known as: BACTROBAN Apply 1 application. topically 2 (two) times daily. For wound care   mycophenolate 360 MG Tbec EC tablet Commonly known as: MYFORTIC Take 720 mg by mouth 2 (two) times daily.   NovoLIN N FlexPen 100 UNIT/ML FlexPen Generic drug: Insulin NPH (Human) (Isophane) INJECT 30 UNITS SUBCUTANEOUSLY IN THE MORNING AND 20 UNITS with SUPPER.   NovoLOG FlexPen 100 UNIT/ML FlexPen Generic drug: insulin aspart INJECT 8 UNITS SUBCUTANEOUSLY with Dinner.   nystatin 100000 UNIT/ML suspension Commonly known as: MYCOSTATIN Take 5 mLs by mouth 4 (four) times daily.   ondansetron 4 MG tablet Commonly known as: ZOFRAN Take 1 tablet (4 mg total) by mouth every 6 (six) hours as needed for nausea.   sodium bicarbonate 650 MG tablet Take 1,300 mg by mouth 3 (three) times daily.   tacrolimus 1 MG capsule Commonly known as: PROGRAF Take 4 capsules (4 mg total) by mouth 2 (two) times daily.   Trulicity 3 MG/0.5ML Soaj Generic drug: Dulaglutide Inject 3 mg as directed once a week.   Vitamin D (Ergocalciferol) 1.25 MG (50000 UNIT) Caps capsule Commonly known as: DRISDOL Take 50,000 Units by mouth See admin instructions. 15th of the month         Procedures/Studies: US Renal Transplant w/Doppler  Result Date: 03/24/2023 CLINICAL DATA:  Renal infection 2 recent transplant kidney biopsies EXAM: ULTRASOUND OF RENAL TRANSPLANT WITH RENAL DOPPLER ULTRASOUND TECHNIQUE: Ultrasound examination of the renal transplant was performed with gray-scale, color and duplex doppler evaluation. COMPARISON:  None available FINDINGS: Transplant kidney location: Right lower quadrant Transplant Kidney: Renal measurements: 12.8 x 10.0 x 8.3 cm = volume: . Large subcapsular hematoma is again seen. No hydronephrosis. Little to no flow present in the upper pole Color flow in the main renal artery:  Yes Color flow in the main renal vein: Little to no flow seen in the renal vein Duplex Doppler Evaluation: Main Renal Artery Velocity: 143 cm/sec Venous waveform in main renal vein: Little to no flow seen in the renal vein Intrarenal resistive index in upper pole:  0.88 (normal 0.6-0.8; equivocal 0.8-0.9; abnormal >= 0.9) Intrarenal resistive index in lower pole: 0.9 (normal 0.6-0.8; equivocal 0.8-0.9; abnormal >= 0.9) Bladder: Linear echogenic structure noted within the bladder lumen of uncertain etiology as no ureteral stent is identified on recent CT. This may be a portion of the adjacent penile prosthesis reservoir. Bladder otherwise unremarkable. Other findings: Native kidneys are atrophic. 2.2 cm simple cyst in the native right and 1.0 cm simple cyst in the native left kidney is do not require dedicated imaging follow-up. IMPRESSION: 1. Large subcapsular hematoma of the right lower quadrant transplant kidney again seen. 2. Little to no flow seen within the upper half of the transplant kidney with little to no venous flow throughout the kidney, likely due to extrinsic compression from subcapsular hematoma. Findings are highly concerning for eminent transplant failure. Electronically Signed   By: Acquanetta Belling M.D.   On: 03/24/2023 13:20   CT CHEST ABDOMEN PELVIS WO  CONTRAST  Result Date: 03/24/2023 CLINICAL DATA:  Coughing up blood bleeding from  penis recent kidney biopsy EXAM: CT CHEST, ABDOMEN AND PELVIS WITHOUT CONTRAST TECHNIQUE: Multidetector CT imaging of the chest, abdomen and pelvis was performed following the standard protocol without IV contrast. RADIATION DOSE REDUCTION: This exam was performed according to the departmental dose-optimization program which includes automated exposure control, adjustment of the mA and/or kV according to patient size and/or use of iterative reconstruction technique. COMPARISON:  CT 01/24/2023 FINDINGS: CT CHEST FINDINGS Cardiovascular: Limited without intravenous contrast. Mild aortic atherosclerosis. No aneurysm. Coronary vascular calcification. Normal cardiac size. No pericardial effusion Mediastinum/Nodes: Midline trachea. No thyroid mass. No suspicious lymph nodes. Esophagus within normal limits. Lungs/Pleura: No acute airspace disease, pleural effusion, or pneumothorax. Musculoskeletal: Neck a mass Thea.  No acute osseous abnormality. CT ABDOMEN PELVIS FINDINGS Hepatobiliary: Stones in the gallbladder. No biliary dilatation. No focal hepatic abnormality Pancreas: Fatty atrophy.  No inflammation Spleen: Normal in size without focal abnormality. Adrenals/Urinary Tract: Left adrenal gland is normal. 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 No specific imaging follow-up is recommended. Atrophic native kidneys without hydronephrosis. Transplanted right lower quadrant kidney with interval increase in peritransplant slightly dense stranding. Hyperdense subcapsular collection surrounding the transplanted right kidney consistent with large subcapsular hematoma, this measures about 3.8 cm in maximum thickness. Urinary bladder is decompressed. Stomach/Bowel: Stomach nonenlarged. No dilated small bowel. No acute bowel wall thickening. Vascular/Lymphatic: Advanced vascular disease. No aneurysm. No suspicious lymph nodes. Reproductive: Slightly  enlarged prostate.  Penile prosthesis. Other: Negative for pelvic effusion or free air. Fat containing left inguinal hernia. Musculoskeletal: No acute osseous abnormality. IMPRESSION: 1. Negative for acute intrathoracic abnormality. 2. Transplanted right lower quadrant kidney with interval finding of large subcapsular hematoma measuring up to 3.8 cm in maximum thickness. Slight interval increase in perinephric stranding about the right kidney, this could relate to small volume hemorrhage related to the patient's recent biopsy procedure. 3. Cholelithiasis. 4. Aortic atherosclerosis. Aortic Atherosclerosis (ICD10-I70.0). Electronically Signed   By: Jasmine Pang M.D.   On: 03/24/2023 02:59   DG Chest 2 View  Result Date: 03/23/2023 CLINICAL DATA:  Cough.  Shortness of breath. EXAM: CHEST - 2 VIEW COMPARISON:  07/10/2022 FINDINGS: Stable borderline cardiomegaly.The cardiomediastinal contours are unchanged. The lungs are clear. Pulmonary vasculature is normal. No consolidation, pleural effusion, or pneumothorax. Left axillary vascular stent. No acute osseous abnormalities are seen. IMPRESSION: 1. No acute chest findings. 2. Stable borderline cardiomegaly. Electronically Signed   By: Narda Rutherford M.D.   On: 03/23/2023 21:38     Subjective: Pt reports he feels unwell. He is not urinating.  He has some chills.    Discharge Exam: Vitals:   03/24/23 1349 03/24/23 1459  BP: (!) 180/88 (!) 156/81  Pulse: 92 92  Resp:    Temp: (!) 100.8 F (38.2 C) (!) 100.9 F (38.3 C)  SpO2: 94% 96%   Vitals:   03/24/23 0845 03/24/23 0854 03/24/23 1349 03/24/23 1459  BP: (!) 152/72  (!) 180/88 (!) 156/81  Pulse: 83  92 92  Resp: (!) 21     Temp: 99.6 F (37.6 C)  (!) 100.8 F (38.2 C) (!) 100.9 F (38.3 C)  TempSrc: Oral  Oral Oral  SpO2: 96%  94% 96%  Weight:  100.8 kg    Height:  5\' 7"  (1.702 m)     General: Pt is alert, awake, not in acute distress Cardiovascular: normal S1/S2 +, no rubs, no  gallops Respiratory: CTA bilaterally, no wheezing, no rhonchi Abdominal: he is tender along area of biopsy site. ND, bowel sounds +  Extremities: no edema, no cyanosis   The results of significant diagnostics from this hospitalization (including imaging, microbiology, ancillary and laboratory) are listed below for reference.     Microbiology: No results found for this or any previous visit (from the past 240 hour(s)).   Labs: BNP (last 3 results) Recent Labs    06/18/22 1007  BNP 10.3   Basic Metabolic Panel: Recent Labs  Lab 03/23/23 2031 03/24/23 0756  NA 139 137  K 5.2* 5.3*  CL 107 107  CO2 20* 19*  GLUCOSE 220* 158*  BUN 50* 57*  CREATININE 2.85* 3.90*  CALCIUM 9.3 9.0   Liver Function Tests: Recent Labs  Lab 03/23/23 2031  AST 57*  ALT 24  ALKPHOS 100  BILITOT 0.6  PROT 8.8*  ALBUMIN 3.3*   No results for input(s): "LIPASE", "AMYLASE" in the last 168 hours. No results for input(s): "AMMONIA" in the last 168 hours. CBC: Recent Labs  Lab 03/23/23 2031 03/24/23 0541 03/24/23 0756  WBC 6.8  --  9.2  HGB 10.9* 9.9* 9.8*  HCT 37.0* 33.1* 33.7*  MCV 82.4  --  83.0  PLT 202  --  174   Cardiac Enzymes: No results for input(s): "CKTOTAL", "CKMB", "CKMBINDEX", "TROPONINI" in the last 168 hours. BNP: Invalid input(s): "POCBNP" CBG: Recent Labs  Lab 03/24/23 0852 03/24/23 1137  GLUCAP 145* 165*   D-Dimer No results for input(s): "DDIMER" in the last 72 hours. Hgb A1c No results for input(s): "HGBA1C" in the last 72 hours. Lipid Profile No results for input(s): "CHOL", "HDL", "LDLCALC", "TRIG", "CHOLHDL", "LDLDIRECT" in the last 72 hours. Thyroid function studies No results for input(s): "TSH", "T4TOTAL", "T3FREE", "THYROIDAB" in the last 72 hours.  Invalid input(s): "FREET3" Anemia work up No results for input(s): "VITAMINB12", "FOLATE", "FERRITIN", "TIBC", "IRON", "RETICCTPCT" in the last 72 hours. Urinalysis    Component Value Date/Time    COLORURINE RED (A) 03/23/2023 0020   APPEARANCEUR CLOUDY (A) 03/23/2023 0020   LABSPEC 1.004 (L) 03/23/2023 0020   PHURINE 6.0 03/23/2023 0020   GLUCOSEU 50 (A) 03/23/2023 0020   HGBUR LARGE (A) 03/23/2023 0020   BILIRUBINUR NEGATIVE 03/23/2023 0020   BILIRUBINUR negative 12/12/2019 1715   KETONESUR NEGATIVE 03/23/2023 0020   PROTEINUR 100 (A) 03/23/2023 0020   UROBILINOGEN 0.2 12/12/2019 1715   NITRITE NEGATIVE 03/23/2023 0020   LEUKOCYTESUR LARGE (A) 03/23/2023 0020   Sepsis Labs Recent Labs  Lab 03/23/23 2031 03/24/23 0756  WBC 6.8 9.2   Microbiology No results found for this or any previous visit (from the past 240 hour(s)).  Time coordinating discharge:  46 mins  SIGNED:  Standley Dakins, MD  [facility] 03/24/2023, 3:19 PM How to contact the [facility] Attending or Consulting provider 7A - 7P or covering provider during after hours 7P -7A, for this patient?  Check the care team in [facility] and look for a) attending/consulting TR[facility] provider listed and b) the [facility] team listed Log into [email or web address] and use [place]'s universal password to access. If you do not have the password, please contact the hospital operator. Locate the [facility] provider you are looking for under [facility] and page to a number that you can be directly reached. If you still have difficulty reaching the provider, please page the [facility] (Director on Call) for the Hospitalists listed on amion for assistance.

## 2023-03-24 NOTE — Progress Notes (Signed)
03/24/2023 2:47 PM  [facility]  I reached out to Dr. Ronalee Belts with concerning renal US results.  He recommended urgent transfer to transplant center.  I called again to [facility] to update nephrology team with new findings.  Dr. Ronalee Belts contacted Dr. Welford Roche who expedited patient getting a bed at [facility].  Pt has a bed now.  Dr. Welford Roche requested STAT CBC, renal function panel which is ordered.  I notified bedside RN and placed DC orders.  Pt ready to transfer as soon as [facility] arrives.    Maryln Manuel MD

## 2023-03-24 NOTE — Assessment & Plan Note (Signed)
He is advised to use a nasal spray

## 2023-03-24 NOTE — H&P (Addendum)
History and Physical  [facility]  Khamar Standard AOZ:[id number] DOB: [birth year] DOA: 03/23/2023  PCP: [name], FNP  Patient coming from: Home  Level of care: Telemetry  I have personally briefly reviewed patient's old medical records in [name] Health Link  Chief Complaint: urinating gross blood and coughing up blood   HPI:  This was a carryover admission accepted by another provider. He was already roomed in a hospital bed when I assumed care of patient.    62 year old gentleman with history of type 1 diabetes mellitus with complications of diabetic retinopathy and peripheral vascular disease status post right BKA, status post renal transplant in 2016 had a recent transplant biopsy done on 03/18/2023 by Dr. Perlie Mayo where he received 6 18-gauge core biopsies of the transplanted kidney.  He presented to the [facility], ED on 03/23/2023 complaining of hemoptysis coughing up bright red blood clots and bleeding from the penis to started 2 hours prior to arrival.  He reports that he does not use blood thinners.  His wife decided to bring him to the nearest ED versus driving to [place].  He also reported some nausea and vomiting.  He denied having fever or chills.  He reports some tenderness at the biopsy site.  He says he did not not feel like the biopsy went well due to having so many core biopsies taken at 1 time.  He had a CT scan done in the ED of the chest abdomen and pelvis and the CT revealed a large subcapsular hematoma of 3.8 cm.  The case was discussed with Dr. Elby Showers of interventional radiology and his recommendation was for patient to be transferred to his transplant hospital.  There apparently were no beds available at [facility] at the time.  The patient was subsequently admitted to [facility] with a plan to consult nephrology to see him.  After discussing with nephrology it was strongly advised that patient transferred to transplant center.  I called the  transfer center for [facility] and spoke with Dr. Shayne Alken who accepted patient for urgent transfer.  He recommended starting IV fluids and obtaining a stat renal ultrasound and follow hemoglobin.  We are waiting for a bed to become available for transfer to [facility].    Past Medical History:  Diagnosis Date   Diabetes (HCC)    Diabetes mellitus without complication (HCC)    type 2   ESRD (end stage renal disease) (HCC)    03/09/2015- patient had a kidney transplant   Hypertension    Nausea vomiting and diarrhea 12/10/2015   Peripheral vascular disease (HCC)    Renal disorder    Renal insufficiency     Past Surgical History:  Procedure Laterality Date   ABDOMINAL AORTOGRAM W/LOWER EXTREMITY N/A 09/11/2021   Procedure: ABDOMINAL AORTOGRAM W/LOWER EXTREMITY;  Surgeon: Iran Ouch, MD;  Location: [facility] INVASIVE CV LAB;  Service: Cardiovascular;  Laterality: N/A;   AMPUTATION Right 09/25/2021   Procedure: RIGHT TRANSMETATARSAL AMPUTATION AND APPLY TISSUE GRAFT;  Surgeon: Nadara Mustard, MD;  Location: [facility] OR;  Service: Orthopedics;  Laterality: Right;   AMPUTATION Right 11/16/2021   Procedure: RIGHT AMPUTATION BELOW KNEE WITH WOUND VAC APPLICATION;  Surgeon: Nadara Mustard, MD;  Location: [facility] OR;  Service: Orthopedics;  Laterality: Right;   BONE BIOPSY Right 03/06/2021   Procedure: BONE BIOPSY;  Surgeon: Vivi Barrack, DPM;  Location: [facility] ORS;  Service: Podiatry;  Laterality: Right;   CENTRAL VENOUS CATHETER INSERTION Right  01/20/2020   Procedure: INSERTION CENTRAL LINE ADULT; Tunneled central line;  Surgeon: Lucretia Roers, MD;  Location: [facility] ORS;  Service: General;  Laterality: Right;   COLONOSCOPY N/A 12/05/2014   FIE:PPIRJJOA external and internal hemorrhoid/mild diverticulosis/11 polyps removed   ESOPHAGOGASTRODUODENOSCOPY N/A 12/05/2014   CZY:SAYT duodenitis   GRAFT APPLICATION Right 03/06/2021   Procedure: GRAFT APPLICATION;  Surgeon: Vivi Barrack,  DPM;  Location: [facility] ORS;  Service: Podiatry;  Laterality: Right;   INCISION AND DRAINAGE OF WOUND Right 08/15/2021   Procedure: IRRIGATION AND DEBRIDEMENT WOUND;  Surgeon: Asencion Islam, DPM;  Location: [facility] ORS;  Service: Podiatry;  Laterality: Right;  need to make card not  a irrigation and debridement card   IR FLUORO GUIDE CV LINE RIGHT  03/01/2019   IR PERC TUN PERIT CATH WO PORT S&I /IMAG  06/27/2021   IR REMOVAL TUN CV CATH W/O FL  05/10/2019   IR REMOVAL TUN CV CATH W/O FL  02/29/2020   IR REMOVAL TUN CV CATH W/O FL  07/19/2021   IR US GUIDE VASC ACCESS RIGHT  03/01/2019   IR US GUIDE VASC ACCESS RIGHT  06/27/2021   IRRIGATION AND DEBRIDEMENT ELBOW Left 06/24/2021   Procedure: IRRIGATION AND DEBRIDEMENT ELBOW;  Surgeon: Lyndle Herrlich, MD;  Location: [facility] ORS;  Service: Orthopedics;  Laterality: Left;   KIDNEY TRANSPLANT  03/27/2015   Penile Pump Insertion     PERIPHERAL VASCULAR ATHERECTOMY  09/11/2021   Procedure: PERIPHERAL VASCULAR ATHERECTOMY;  Surgeon: Iran Ouch, MD;  [facility]: [facility] INVASIVE CV LAB;  Service: Cardiovascular;;  Shockwave Lithotripsy   TEE WITHOUT CARDIOVERSION N/A 01/17/2020   Procedure: TRANSESOPHAGEAL ECHOCARDIOGRAM (TEE) WITH PROPOFOL;  Surgeon: Antoine Poche, MD;  Location: [facility] ENDO SUITE;  Service: Endoscopy;  Laterality: N/A;   WOUND DEBRIDEMENT Right 03/06/2021   Procedure: DEBRIDEMENT WOUND;  Surgeon: Vivi Barrack, DPM;  Location: [facility] ORS;  Service: Podiatry;  Laterality: Right;     reports that he has never smoked. He has never used smokeless tobacco. He reports that he does not drink alcohol and does not use drugs.  Allergies  Allergen Reactions   Bactrim [Sulfamethoxazole-Trimethoprim]     Runs pt's sugar and phosphorus   Doxycycline Other (See Comments)    Runs up pt's blood sugar and phosphorus   Flagyl [Metronidazole] Rash    Patient having hives to either the vancomycin or flagyl (both infusing together).   Vancomycin Rash     Patient having hives to either vancomycin or flagyl    Family History  Problem Relation Age of Onset   Diabetes Mother    Heart disease Father    Colon cancer Neg Hx     Prior to Admission medications   Medication Sig Start Date End Date Taking? Authorizing Provider  atorvastatin (LIPITOR) 10 MG tablet Take 1 tablet (10 mg total) by mouth daily. 05/13/22  Yes Runell Gess, MD  Azathioprine 75 MG TABS Take 2 tablets by mouth daily.   Yes [provider]  brimonidine (ALPHAGAN) 0.2 % ophthalmic solution Place 1 drop into both eyes 3 (three) times daily. 07/25/21  Yes [provider]  cloNIDine (CATAPRES - DOSED IN MG/24 HR) 0.3 mg/24hr patch Place 1 patch (0.3 mg total) onto the skin once a week. Patient taking differently: Place 0.3 mg onto the skin once a week. Place patch on Sunday 08/21/21  Yes Runell Gess, MD  dorzolamide-timolol (COSOPT) 22.3-6.8 MG/ML ophthalmic solution Place 1 drop into both eyes 2 (  two) times daily. 07/25/21  Yes [provider]  Dulaglutide (TRULICITY) 3 MG/0.5ML SOPN Inject 3 mg as directed once a week. 01/27/23  Yes Thapa, [place], MD  furosemide (LASIX) 20 MG tablet 20 mg 2 (two) times daily. 04/23/22  Yes [provider]  gabapentin (NEURONTIN) 300 MG capsule Take 3 capsules (900 mg total) by mouth 3 (three) times daily. 3 times a day 11/22/21  Yes Barnie Del R, NP  hydrALAZINE (APRESOLINE) 25 MG tablet Take 1 tablet (25 mg total) by mouth 3 (three) times daily. 02/05/22  Yes [name], FNP  latanoprost (XALATAN) 0.005 % ophthalmic solution Place 1 drop into both eyes at bedtime. 07/25/21  Yes [provider]  Multiple Vitamin (MULTIVITAMIN) capsule Take 1 capsule by mouth daily.   Yes [provider]  mycophenolate (MYFORTIC) 360 MG TBEC EC tablet Take 720 mg by mouth 2 (two) times daily. 03/19/23  Yes [provider]  NOVOLIN N FLEXPEN 100 UNIT/ML FlexPen INJECT 30 UNITS SUBCUTANEOUSLY IN THE  MORNING AND 20 UNITS with SUPPER. 01/27/23  Yes Thapa, [place], MD  nystatin (MYCOSTATIN) 100000 UNIT/ML suspension Take 5 mLs by mouth 4 (four) times daily. 03/19/23  Yes [provider]  sodium bicarbonate 650 MG tablet Take 1,300 mg by mouth 3 (three) times daily. 11/02/18  Yes [provider]  tacrolimus (PROGRAF) 1 MG capsule Take 4 capsules (4 mg total) by mouth 2 (two) times daily. 08/30/21  Yes Rai, Ripudeep K, MD  Vitamin D, Ergocalciferol, (DRISDOL) 50000 units CAPS capsule Take 50,000 Units by mouth See admin instructions. 15th of the month   Yes [provider]  blood glucose meter kit and supplies KIT Dispense based on patient and insurance preference. Use up to four times daily as directed. (FOR ICD-9 250.00, 250.01). 04/11/20   Roma Kayser, MD  Blood Glucose Monitoring Suppl (ACCU-CHEK GUIDE ME) w/Device KIT 1 Piece by Does not apply route as directed. 02/18/18   Roma Kayser, MD  clopidogrel (PLAVIX) 75 MG tablet Take 1 tablet (75 mg total) by mouth daily. Patient not taking: Reported on 03/24/2023 02/25/23   Runell Gess, MD  Continuous Blood Gluc Receiver (DEXCOM G7 RECEIVER) DEVI Use to check blood sugar 3 times a day. Dx code e11.65 12/16/21   [name], FNP  Continuous Glucose Sensor (DEXCOM G7 SENSOR) MISC USE TO CHECK GLUCOSE THREE TIMES DAILY AS DIRECTED, CHANGE SENSOR EVERY 10 DAYS. 02/03/23   Thapa, [place], MD  doxazosin (CARDURA) 4 MG tablet Take 4 mg by mouth daily. Patient not taking: Reported on 03/24/2023 12/18/15   [provider]  glucose blood (ACCU-CHEK GUIDE) test strip Use as instructed 4 x daily. E11.65 02/19/18   Roma Kayser, MD  mupirocin ointment (BACTROBAN) 2 % Apply 1 application. topically 2 (two) times daily. For wound care 09/12/21   Stover, Titorya, DPM  NOVOLOG FLEXPEN 100 UNIT/ML FlexPen INJECT 8 UNITS SUBCUTANEOUSLY with Dinner. 01/27/23   Thapa, [place], MD  ondansetron (ZOFRAN) 4 MG tablet Take 1  tablet (4 mg total) by mouth every 6 (six) hours as needed for nausea. 11/19/21   Pokhrel, Rebekah Chesterfield, MD  predniSONE (DELTASONE) 10 MG tablet Take by mouth as directed. Patient not taking: Reported on 03/24/2023 03/19/23   [provider]    Physical Exam: Vitals:   03/24/23 0808 03/24/23 0845 03/24/23 0854 03/24/23 1349  BP: (!) 171/112 (!) 152/72  (!) 180/88  Pulse:  83  92  Resp:  (!) 21  Temp:  99.6 F (37.6 C)  (!) 100.8 F (38.2 C)  TempSrc:  Oral  Oral  SpO2:  96%  94%  Weight:   100.8 kg   Height:   5\' 7"  (1.702 m)     Constitutional: NAD, calm, comfortable Eyes: PERRL, lids and conjunctivae normal ENMT: Mucous membranes are moist. Posterior pharynx clear of any exudate or lesions.Normal dentition.  Neck: normal, supple, no masses, no thyromegaly Respiratory: clear to auscultation bilaterally, no wheezing, no crackles. Normal respiratory effort. No accessory muscle use.  Cardiovascular: normal s1, s2 sounds, no murmurs / rubs / gallops. No extremity edema. 2+ pedal pulses. No carotid bruits.  Abdomen: obese, soft, some mild tenderness at area of biopsy, no masses palpated. No hepatosplenomegaly. Bowel sounds positive.  Musculoskeletal: no clubbing / cyanosis. No joint deformity upper and lower extremities. Good ROM, no contractures. Normal muscle tone.  Skin: no rashes, lesions, ulcers. No induration Neurologic: CN 2-12 grossly intact. Sensation intact, DTR normal. Strength 5/5 in all 4.  Psychiatric: Normal judgment and insight. Alert and oriented x 3. Normal mood.   Labs on Admission: I have personally reviewed following labs and imaging studies  CBC: Recent Labs  Lab 03/23/23 2031 03/24/23 0541 03/24/23 0756  WBC 6.8  --  9.2  HGB 10.9* 9.9* 9.8*  HCT 37.0* 33.1* 33.7*  MCV 82.4  --  83.0  PLT 202  --  174   Basic Metabolic Panel: Recent Labs  Lab 03/23/23 2031 03/24/23 0756  NA 139 137  K 5.2* 5.3*  CL 107 107  CO2 20* 19*  GLUCOSE 220* 158*   BUN 50* 57*  CREATININE 2.85* 3.90*  CALCIUM 9.3 9.0   GFR: Estimated Creatinine Clearance: 22.2 mL/min (A) (by C-G formula based on SCr of 3.9 mg/dL (H)). Liver Function Tests: Recent Labs  Lab 03/23/23 2031  AST 57*  ALT 24  ALKPHOS 100  BILITOT 0.6  PROT 8.8*  ALBUMIN 3.3*   No results for input(s): "LIPASE", "AMYLASE" in the last 168 hours. No results for input(s): "AMMONIA" in the last 168 hours. Coagulation Profile: Recent Labs  Lab 03/23/23 2031  INR 1.1   Cardiac Enzymes: No results for input(s): "CKTOTAL", "CKMB", "CKMBINDEX", "TROPONINI" in the last 168 hours. BNP (last 3 results) No results for input(s): "PROBNP" in the last 8760 hours. HbA1C: No results for input(s): "HGBA1C" in the last 72 hours. CBG: Recent Labs  Lab 03/24/23 0852 03/24/23 1137  GLUCAP 145* 165*   Lipid Profile: No results for input(s): "CHOL", "HDL", "LDLCALC", "TRIG", "CHOLHDL", "LDLDIRECT" in the last 72 hours. Thyroid Function Tests: No results for input(s): "TSH", "T4TOTAL", "FREET4", "T3FREE", "THYROIDAB" in the last 72 hours. Anemia Panel: No results for input(s): "VITAMINB12", "FOLATE", "FERRITIN", "TIBC", "IRON", "RETICCTPCT" in the last 72 hours. Urine analysis:    Component Value Date/Time   COLORURINE RED (A) 03/23/2023 0020   APPEARANCEUR CLOUDY (A) 03/23/2023 0020   LABSPEC 1.004 (L) 03/23/2023 0020   PHURINE 6.0 03/23/2023 0020   GLUCOSEU 50 (A) 03/23/2023 0020   HGBUR LARGE (A) 03/23/2023 0020   BILIRUBINUR NEGATIVE 03/23/2023 0020   BILIRUBINUR negative 12/12/2019 1715   KETONESUR NEGATIVE 03/23/2023 0020   PROTEINUR 100 (A) 03/23/2023 0020   UROBILINOGEN 0.2 12/12/2019 1715   NITRITE NEGATIVE 03/23/2023 0020   LEUKOCYTESUR LARGE (A) 03/23/2023 0020    Radiological Exams on Admission: US Renal Transplant w/Doppler  Result Date: 03/24/2023 CLINICAL DATA:  Renal infection 2 recent transplant kidney biopsies EXAM: ULTRASOUND OF RENAL TRANSPLANT  WITH RENAL  DOPPLER ULTRASOUND TECHNIQUE: Ultrasound examination of the renal transplant was performed with gray-scale, color and duplex doppler evaluation. COMPARISON:  None available FINDINGS: Transplant kidney location: Right lower quadrant Transplant Kidney: Renal measurements: 12.8 x 10.0 x 8.3 cm = volume: . Large subcapsular hematoma is again seen. No hydronephrosis. Little to no flow present in the upper pole Color flow in the main renal artery:  Yes Color flow in the main renal vein: Little to no flow seen in the renal vein Duplex Doppler Evaluation: Main Renal Artery Velocity: 143 cm/sec Venous waveform in main renal vein: Little to no flow seen in the renal vein Intrarenal resistive index in upper pole:  0.88 (normal 0.6-0.8; equivocal 0.8-0.9; abnormal >= 0.9) Intrarenal resistive index in lower pole: 0.9 (normal 0.6-0.8; equivocal 0.8-0.9; abnormal >= 0.9) Bladder: Linear echogenic structure noted within the bladder lumen of uncertain etiology as no ureteral stent is identified on recent CT. This may be a portion of the adjacent penile prosthesis reservoir. Bladder otherwise unremarkable. Other findings: Native kidneys are atrophic. 2.2 cm simple cyst in the native right and 1.0 cm simple cyst in the native left kidney is do not require dedicated imaging follow-up. IMPRESSION: 1. Large subcapsular hematoma of the right lower quadrant transplant kidney again seen. 2. Little to no flow seen within the upper half of the transplant kidney with little to no venous flow throughout the kidney, likely due to extrinsic compression from subcapsular hematoma. Findings are highly concerning for eminent transplant failure. Electronically Signed   By: Acquanetta Belling M.D.   On: 03/24/2023 13:20   CT CHEST ABDOMEN PELVIS WO CONTRAST  Result Date: 03/24/2023 CLINICAL DATA:  Coughing up blood bleeding from penis recent kidney biopsy EXAM: CT CHEST, ABDOMEN AND PELVIS WITHOUT CONTRAST TECHNIQUE: Multidetector CT imaging of  the chest, abdomen and pelvis was performed following the standard protocol without IV contrast. RADIATION DOSE REDUCTION: This exam was performed according to the departmental dose-optimization program which includes automated exposure control, adjustment of the mA and/or kV according to patient size and/or use of iterative reconstruction technique. COMPARISON:  CT 01/24/2023 FINDINGS: CT CHEST FINDINGS Cardiovascular: Limited without intravenous contrast. Mild aortic atherosclerosis. No aneurysm. Coronary vascular calcification. Normal cardiac size. No pericardial effusion Mediastinum/Nodes: Midline trachea. No thyroid mass. No suspicious lymph nodes. Esophagus within normal limits. Lungs/Pleura: No acute airspace disease, pleural effusion, or pneumothorax. Musculoskeletal: Neck a mass Thea.  No acute osseous abnormality. CT ABDOMEN PELVIS FINDINGS Hepatobiliary: Stones in the gallbladder. No biliary dilatation. No focal hepatic abnormality Pancreas: Fatty atrophy.  No inflammation Spleen: Normal in size without focal abnormality. Adrenals/Urinary Tract: Left adrenal gland is normal. 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 No specific imaging follow-up is recommended. Atrophic native kidneys without hydronephrosis. Transplanted right lower quadrant kidney with interval increase in peritransplant slightly dense stranding. Hyperdense subcapsular collection surrounding the transplanted right kidney consistent with large subcapsular hematoma, this measures about 3.8 cm in maximum thickness. Urinary bladder is decompressed. Stomach/Bowel: Stomach nonenlarged. No dilated small bowel. No acute bowel wall thickening. Vascular/Lymphatic: Advanced vascular disease. No aneurysm. No suspicious lymph nodes. Reproductive: Slightly enlarged prostate.  Penile prosthesis. Other: Negative for pelvic effusion or free air. Fat containing left inguinal hernia. Musculoskeletal: No acute osseous abnormality. IMPRESSION: 1. Negative for acute  intrathoracic abnormality. 2. Transplanted right lower quadrant kidney with interval finding of large subcapsular hematoma measuring up to 3.8 cm in maximum thickness. Slight interval increase in perinephric stranding about the right kidney, this could relate to small  volume hemorrhage related to the patient's recent biopsy procedure. 3. Cholelithiasis. 4. Aortic atherosclerosis. Aortic Atherosclerosis (ICD10-I70.0). Electronically Signed   By: Jasmine Pang M.D.   On: 03/24/2023 02:59   DG Chest 2 View  Result Date: 03/23/2023 CLINICAL DATA:  Cough.  Shortness of breath. EXAM: CHEST - 2 VIEW COMPARISON:  07/10/2022 FINDINGS: Stable borderline cardiomegaly.The cardiomediastinal contours are unchanged. The lungs are clear. Pulmonary vasculature is normal. No consolidation, pleural effusion, or pneumothorax. Left axillary vascular stent. No acute osseous abnormalities are seen. IMPRESSION: 1. No acute chest findings. 2. Stable borderline cardiomegaly. Electronically Signed   By: Narda Rutherford M.D.   On: 03/23/2023 21:38    Assessment/Plan Principal Problem:   AKI (acute kidney injury) (HCC) Active Problems:   GERD (gastroesophageal reflux disease)   Immunosuppression (HCC)   Type 2 diabetes mellitus (HCC)   History of renal transplant   Thrombophlebitis of superficial veins of right lower extremity   Peripheral vascular disease (HCC)   Proliferative diabetic retinopathy with macular edema associated with type 2 diabetes mellitus (HCC)   Hyperkalemia   Perinephric hematoma of transplanted kidney   Acute blood loss anemia   History of bleeding following renal biopsy    Perinephric hematoma of transplanted kidney - complication of recent biopsy done on 03/18/23 at [facility] - I was able to call and speak with nephrologist Dr. Shayne Alken who accepted patient for transfer to [facility], waiting on bed to be available  - he recommended IV fluid, renal US, monitoring H/H - appreciate  Dr. Ronalee Belts consultation  - updated staff about transfer, called radiology to push out images to [facility]   AKI - IV fluids ordered - follow renal function panel  - follow up transplant kidney US (pending) - appreciate nephrology recommendations  Hyperkalemia - lokelma ordered  - IV fluids ordered  History of renal transplant/immunosuppression - resume home regular anti-rejection medications - transfer to his renal transplant center in progress  Type 1 DM with vascular, retinal and neurological complications - resume NPH insulin, but reduced doses given AKI - follow CBG 5x per day   Acute blood loss anemia - from perinephric hematoma and gross hematuria - following H/H closely   Hemoptysis  - fortunately no recurrences since arrival - CT chest was negative for acute findings.  - he is not on blood thinning medications - likely a consequence of recently treated pneumonia  - he remains on room air oxygen - follow    DVT prophylaxis: SCDs  Code Status: Full   Family Communication:   Disposition Plan: transfer to [facility] called: nephrology   Admission status: INP  Level of care: Telemetry Standley Dakins MD [facility] How to contact the [facility] Attending or Consulting provider 7A - 7P or covering provider during after hours 7P -7A, for this patient?  Check the care team in [facility] and look for a) attending/consulting [facility] provider listed and b) the [facility] team listed Log into [email or web address] and use [place]'s universal password to access. If you do not have the password, please contact the hospital operator. Locate the [facility] provider you are looking for under [facility] and page to a number that you can be directly reached. If you still have difficulty reaching the provider, please page the [facility] (Director on Call) for the Hospitalists listed on amion for assistance.   If 7PM-7AM, please contact night-coverage [email or web address] Password  [email or web address]  03/24/2023, 2:58 PM

## 2023-03-24 NOTE — Assessment & Plan Note (Signed)
Influenza vaccine administered Encouraged to take Tylenol as needed for fever or muscle aches.

## 2023-03-24 NOTE — Hospital Course (Addendum)
This was a carryover admission accepted by another provider. He was already roomed in a hospital bed when I assumed care of patient.    62 year old gentleman with history of diabetes mellitus with complications of diabetic retinopathy and peripheral vascular disease status post right BKA, status post renal transplant in 2016 had a recent transplant biopsy done on 03/18/2023 by Dr. Perlie Mayo where he received 6 18-gauge core biopsies of the transplanted kidney.  He presented to the [facility] ED on 03/23/2023 complaining of hemoptysis coughing up bright red blood clots and bleeding from the penis to started 2 hours prior to arrival.  He reports that he does not use blood thinners.  His wife decided to bring him to the nearest ED versus driving to [place].  He also reported some nausea and vomiting.  He denied having fever or chills.  He reports some tenderness at the biopsy site.  He says he did not not feel like the biopsy went well due to having so many core biopsies taken at 1 time.  He had a CT scan done in the ED of the chest abdomen and pelvis and the CT revealed a large subcapsular hematoma of 3.8 cm.  The case was discussed with Dr. Elby Showers of interventional radiology and his recommendation was for patient to be transferred to his transplant hospital.  There apparently were no beds available at [facility] at the time.  The patient was subsequently admitted to [facility] with a plan to consult nephrology to see him.  After discussing with nephrology it was strongly advised that patient transferred to transplant center.  I called the transfer center for [facility] and spoke with Dr. Shayne Alken who accepted patient for urgent transfer.  He recommended starting IV fluids and obtaining a stat renal ultrasound and follow hemoglobin.  We are waiting for a bed to become available for transfer to [facility].

## 2023-03-24 NOTE — ED Notes (Signed)
Pt back from CT

## 2023-03-24 NOTE — Assessment & Plan Note (Signed)

## 2023-03-24 NOTE — Assessment & Plan Note (Signed)
Chronic, stable. No current medications.

## 2023-03-24 NOTE — Progress Notes (Signed)
   03/24/23 1155  TOC Brief Assessment  Insurance and Status Reviewed  Patient has primary care physician Yes  Home environment has been reviewed Home with spouse  Prior level of function: independent  Prior/Current Home Services No current home services  Social Determinants of Health Reivew SDOH reviewed no interventions necessary  Readmission risk has been reviewed Yes  Transition of care needs no transition of care needs at this time   Transition of Care Department [facility]) has reviewed patient and no TOC needs have been identified at this time. We will continue to monitor patient advancement through interdisciplinary progression rounds. If new patient transition needs arise, please place a TOC consult.

## 2023-03-24 NOTE — ED Notes (Signed)
ED TO INPATIENT HANDOFF REPORT  ED Nurse Name and Phone #:  Neldon Mc RN [phone]  S Name/Age/Gender Raymond [name] 62 y.o. male Room/Bed: APA02/APA02  Code Status   Code Status: Prior  Home/SNF/Other Home Patient oriented to: self, place, time, and situation Is this baseline? Yes   Triage Complete: Triage complete  Chief Complaint AKI (acute kidney injury) (HCC) [N17.9]  Triage Note C/o coughing up blood described as bright red clots and bleeding from penis that started 2 hr PTA.  Denies blood thinner usage  Recent kidney biopsy on 10/23 in [place].     Allergies Allergies  Allergen Reactions   Bactrim [Sulfamethoxazole-Trimethoprim]     Runs pt's sugar and phosphorus   Doxycycline Other (See Comments)    Runs up pt's blood sugar and phosphorus   Flagyl [Metronidazole] Rash    Patient having hives to either the vancomycin or flagyl (both infusing together).   Vancomycin Rash    Patient having hives to either vancomycin or flagyl    Level of Care/Admitting Diagnosis ED Disposition     ED Disposition  Admit   Condition  --   Comment  Hospital Area: [facility] [[id number]]  Level of Care: Telemetry [5]  Covid Evaluation: Asymptomatic - no recent exposure (last 10 days) testing not required  Diagnosis: AKI (acute kidney injury) [facility]) [[id number]]  Admitting Physician: Lilyan Gilford [[id number]]  Attending Physician: Lilyan Gilford [[id number]]  Certification:: I certify this patient will need inpatient services for at least 2 midnights  Expected Medical Readiness: 03/26/2023          B Medical/Surgery History Past Medical History:  Diagnosis Date   Diabetes (HCC)    Diabetes mellitus without complication (HCC)    type 2   ESRD (end stage renal disease) (HCC)    03/09/2015- patient had a kidney transplant   Hypertension    Nausea vomiting and diarrhea 12/10/2015   Peripheral vascular disease (HCC)    Renal disorder     Renal insufficiency    Past Surgical History:  Procedure Laterality Date   ABDOMINAL AORTOGRAM W/LOWER EXTREMITY N/A 09/11/2021   Procedure: ABDOMINAL AORTOGRAM W/LOWER EXTREMITY;  Surgeon: Iran Ouch, MD;  [facility] IN[facility];  Service: Cardiovascular;  Laterality: N/A;   AMPUTATION Right 09/25/2021   Procedure: RIGHT TRANSMETATARSAL AMPUTATION AND APPLY TISSUE GRAFT;  Surgeon: Nadara Mustard, MD;  Location: [facility] OR;  Service: Orthopedics;  Laterality: Right;   AMPUTATION Right 11/16/2021   Procedure: RIGHT AMPUTATION BELOW KNEE WITH WOUND VAC APPLICATION;  Surgeon: Nadara Mustard, MD;  Location: [facility] OR;  Service: Orthopedics;  Laterality: Right;   BONE BIOPSY Right 03/06/2021   Procedure: BONE BIOPSY;  Surgeon: Vivi Barrack, DPM;  Location: [facility] ORS;  Service: Podiatry;  Laterality: Right;   CENTRAL VENOUS CATHETER INSERTION Right 01/20/2020   Procedure: INSERTION CENTRAL LINE ADULT; Tunneled central line;  Surgeon: Lucretia Roers, MD;  Location: [facility] ORS;  Service: General;  Laterality: Right;   COLONOSCOPY N/A 12/05/2014   QIH:[id number] external and internal hemorrhoid/mild diverticulosis/11 polyps removed   ESOPHAGOGASTRODUODENOSCOPY N/A 12/05/2014   LOV:FIEP duodenitis   GRAFT APPLICATION Right 03/06/2021   Procedure: GRAFT APPLICATION;  Surgeon: Vivi Barrack, DPM;  Location: [facility] ORS;  Service: Podiatry;  Laterality: Right;   INCISION AND DRAINAGE OF WOUND Right 08/15/2021   Procedure: IRRIGATION AND DEBRIDEMENT WOUND;  Surgeon: Asencion Islam, DPM;  Location: [facility] ORS;  Service: Podiatry;  Laterality: Right;  need to make  card not  a irrigation and debridement card   IR FLUORO GUIDE CV LINE RIGHT  03/01/2019   IR PERC TUN PERIT CATH WO PORT S&I /IMAG  06/27/2021   IR REMOVAL TUN CV CATH W/O FL  05/10/2019   IR REMOVAL TUN CV CATH W/O FL  02/29/2020   IR REMOVAL TUN CV CATH W/O FL  07/19/2021   IR US GUIDE VASC ACCESS RIGHT  03/01/2019   IR US GUIDE VASC ACCESS RIGHT   06/27/2021   IRRIGATION AND DEBRIDEMENT ELBOW Left 06/24/2021   Procedure: IRRIGATION AND DEBRIDEMENT ELBOW;  Surgeon: Lyndle Herrlich, MD;  Location: [facility] ORS;  Service: Orthopedics;  Laterality: Left;   KIDNEY TRANSPLANT  03/27/2015   Penile Pump Insertion     PERIPHERAL VASCULAR ATHERECTOMY  09/11/2021   Procedure: PERIPHERAL VASCULAR ATHERECTOMY;  Surgeon: Iran Ouch, MD;  [facility];  Service: Cardiovascular;;  Shockwave Lithotripsy   TEE WITHOUT CARDIOVERSION N/A 01/17/2020   Procedure: TRANSESOPHAGEAL ECHOCARDIOGRAM (TEE) WITH PROPOFOL;  Surgeon: Antoine Poche, MD;  Location[facility];  Service: Endoscopy;  Laterality: N/A;   WOUND DEBRIDEMENT Right 03/06/2021   Procedure: DEBRIDEMENT WOUND;  Surgeon: Vivi Barrack, DPM;  Location: [facility] ORS;  Service: Podiatry;  Laterality: Right;     A IV Location/Drains/Wounds Patient Lines/Drains/Airways Status     Active Line/Drains/Airways     Name Placement date Placement time Site Days   Peripheral IV 03/24/23 22 G Anterior;Distal;Right Forearm 03/24/23  0632  Forearm  less than 1   Fistula / Graft Left Upper arm Arteriovenous fistula --  --  Upper arm  --   Open Drain Left Elbow  06/24/21  1755  Elbow  638   Negative Pressure Wound Therapy Pretibial Proximal;Right 11/16/21  1056  --  493            Intake/Output Last 24 hours No intake or output data in the 24 hours ending 03/24/23 8182  Labs/Imaging Results for orders placed or performed during the hospital encounter of 03/23/23 (from the past 48 hour(s))  Urinalysis, Routine w reflex microscopic -Urine, Clean Catch     Status: Abnormal   Collection Time: 03/23/23 12:20 AM  Result Value Ref Range   Color, Urine RED (A) YELLOW    Comment: BIOCHEMICALS MAY BE AFFECTED BY COLOR   APPearance CLOUDY (A) CLEAR   Specific Gravity, Urine 1.004 (L) 1.005 - 1.030   pH 6.0 5.0 - 8.0   Glucose, UA 50 (A) NEGATIVE mg/dL   Hgb urine dipstick LARGE (A)  NEGATIVE   Bilirubin Urine NEGATIVE NEGATIVE   Ketones, ur NEGATIVE NEGATIVE mg/dL   Protein, ur 993 (A) NEGATIVE mg/dL   Nitrite NEGATIVE NEGATIVE   Leukocytes,Ua LARGE (A) NEGATIVE   RBC / HPF >50 0 - 5 RBC/hpf   WBC, UA >50 0 - 5 WBC/hpf   Bacteria, UA FEW (A) NONE SEEN   Squamous Epithelial / HPF 0-5 0 - 5 /HPF   Trans Epithel, UA 3    WBC Clumps PRESENT     Comment: Performed at [facility], [address] [place], [place] [address]  Comprehensive metabolic panel     Status: Abnormal   Collection Time: 03/23/23  8:31 PM  Result Value Ref Range   Sodium 139 135 - 145 mmol/L   Potassium 5.2 (H) 3.5 - 5.1 mmol/L   Chloride 107 98 - 111 mmol/L   CO2 20 (L) 22 - 32 mmol/L   Glucose, Bld  220 (H) 70 - 99 mg/dL    Comment: Glucose reference range applies only to samples taken after fasting for at least 8 hours.   BUN 50 (H) 8 - 23 mg/dL   Creatinine, Ser 1.61 (H) 0.61 - 1.24 mg/dL   Calcium 9.3 8.9 - 09.6 mg/dL   Total Protein 8.8 (H) 6.5 - 8.1 g/dL   Albumin 3.3 (L) 3.5 - 5.0 g/dL   AST 57 (H) 15 - 41 U/L   ALT 24 0 - 44 U/L   Alkaline Phosphatase 100 38 - 126 U/L   Total Bilirubin 0.6 0.3 - 1.2 mg/dL   GFR, Estimated 24 (L) >60 mL/min    Comment: (NOTE) Calculated using the CKD-EPI Creatinine Equation (2021)    Anion gap 12 5 - 15    Comment: Performed at [facility], [address] [place], [place] [address]  CBC     Status: Abnormal   Collection Time: 03/23/23  8:31 PM  Result Value Ref Range   WBC 6.8 4.0 - 10.5 K/uL   RBC 4.49 4.22 - 5.81 MIL/uL   Hemoglobin 10.9 (L) 13.0 - 17.0 g/dL   HCT 98.1 (L) 19.1 - 47.8 %   MCV 82.4 80.0 - 100.0 fL   MCH 24.3 (L) 26.0 - 34.0 pg   MCHC 29.5 (L) 30.0 - 36.0 g/dL   RDW 29.5 (H) 62.1 - 30.8 %   Platelets 202 150 - 400 K/uL   nRBC 0.0 0.0 - 0.2 %    Comment: Performed at [facility], [address], [place], [place] [address]  Type and screen [facility]     Status: None   Collection Time: 03/23/23  8:31 PM  Result  Value Ref Range   ABO/RH(D) O POS    Antibody Screen NEG    Sample Expiration      03/26/2023,2359 Performed at [facility], [address] [place], [place] [address]   Protime-INR     Status: None   Collection Time: 03/23/23  8:31 PM  Result Value Ref Range   Prothrombin Time 14.2 11.4 - 15.2 seconds   INR 1.1 0.8 - 1.2    Comment: (NOTE) INR goal varies based on device and disease states. Performed at [facility], [address], [place], [place] [address]   APTT     Status: None   Collection Time: 03/23/23  8:31 PM  Result Value Ref Range   aPTT 31 24 - 36 seconds    Comment: Performed at [facility], [address] [place], [place] [address]  Hemoglobin and hematocrit, blood     Status: Abnormal   Collection Time: 03/24/23  5:41 AM  Result Value Ref Range   Hemoglobin 9.9 (L) 13.0 - 17.0 g/dL   HCT 10.2 (L) 72.5 - 36.6 %    Comment: Performed at [facility], [address] [place], [place] [address]  CBC     Status: Abnormal   Collection Time: 03/24/23  7:56 AM  Result Value Ref Range   WBC 9.2 4.0 - 10.5 K/uL   RBC 4.06 (L) 4.22 - 5.81 MIL/uL   Hemoglobin 9.8 (L) 13.0 - 17.0 g/dL   HCT 74.2 (L) 59.5 - 63.8 %   MCV 83.0 80.0 - 100.0 fL   MCH 24.1 (L) 26.0 - 34.0 pg   MCHC 29.1 (L) 30.0 - 36.0 g/dL   RDW 75.6 (H) 43.3 - 29.5 %   Platelets 174 150 - 400 K/uL   nRBC 0.0 0.0 -  0.2 %    Comment: Performed at [facility], [address], [place], [place] [address]   CT CHEST ABDOMEN PELVIS WO CONTRAST  Result Date: 03/24/2023 CLINICAL DATA:  Coughing up blood bleeding from penis recent kidney biopsy EXAM: CT CHEST, ABDOMEN AND PELVIS WITHOUT CONTRAST TECHNIQUE: Multidetector CT imaging of the chest, abdomen and pelvis was performed following the standard protocol without IV contrast. RADIATION DOSE REDUCTION: This exam was performed according to the departmental dose-optimization program which includes automated exposure control, adjustment of the mA and/or kV according  to patient size and/or use of iterative reconstruction technique. COMPARISON:  CT 01/24/2023 FINDINGS: CT CHEST FINDINGS Cardiovascular: Limited without intravenous contrast. Mild aortic atherosclerosis. No aneurysm. Coronary vascular calcification. Normal cardiac size. No pericardial effusion Mediastinum/Nodes: Midline trachea. No thyroid mass. No suspicious lymph nodes. Esophagus within normal limits. Lungs/Pleura: No acute airspace disease, pleural effusion, or pneumothorax. Musculoskeletal: Neck a mass Thea.  No acute osseous abnormality. CT ABDOMEN PELVIS FINDINGS Hepatobiliary: Stones in the gallbladder. No biliary dilatation. No focal hepatic abnormality Pancreas: Fatty atrophy.  No inflammation Spleen: Normal in size without focal abnormality. Adrenals/Urinary Tract: Left adrenal gland is normal. 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 No specific imaging follow-up is recommended. Atrophic native kidneys without hydronephrosis. Transplanted right lower quadrant kidney with interval increase in peritransplant slightly dense stranding. Hyperdense subcapsular collection surrounding the transplanted right kidney consistent with large subcapsular hematoma, this measures about 3.8 cm in maximum thickness. Urinary bladder is decompressed. Stomach/Bowel: Stomach nonenlarged. No dilated small bowel. No acute bowel wall thickening. Vascular/Lymphatic: Advanced vascular disease. No aneurysm. No suspicious lymph nodes. Reproductive: Slightly enlarged prostate.  Penile prosthesis. Other: Negative for pelvic effusion or free air. Fat containing left inguinal hernia. Musculoskeletal: No acute osseous abnormality. IMPRESSION: 1. Negative for acute intrathoracic abnormality. 2. Transplanted right lower quadrant kidney with interval finding of large subcapsular hematoma measuring up to 3.8 cm in maximum thickness. Slight interval increase in perinephric stranding about the right kidney, this could relate to small volume hemorrhage  related to the patient's recent biopsy procedure. 3. Cholelithiasis. 4. Aortic atherosclerosis. Aortic Atherosclerosis (ICD10-I70.0). Electronically Signed   By: Jasmine Pang M.D.   On: 03/24/2023 02:59   DG Chest 2 View  Result Date: 03/23/2023 CLINICAL DATA:  Cough.  Shortness of breath. EXAM: CHEST - 2 VIEW COMPARISON:  07/10/2022 FINDINGS: Stable borderline cardiomegaly.The cardiomediastinal contours are unchanged. The lungs are clear. Pulmonary vasculature is normal. No consolidation, pleural effusion, or pneumothorax. Left axillary vascular stent. No acute osseous abnormalities are seen. IMPRESSION: 1. No acute chest findings. 2. Stable borderline cardiomegaly. Electronically Signed   By: Narda Rutherford M.D.   On: 03/23/2023 21:38    Pending Labs Unresulted Labs (From admission, onward)     Start     Ordered   03/24/23 0738  Basic metabolic panel  Once,   R        03/24/23 0737            Vitals/Pain Today's Vitals   03/24/23 0600 03/24/23 0800 03/24/23 0805 03/24/23 0808  BP: (!) 182/94 (!) 171/112  (!) 171/112  Pulse: 71 76    Resp: (!) 21 20    Temp:   98.1 F (36.7 C)   TempSrc:      SpO2: 95% 95%    Weight:      PainSc:        Isolation Precautions No active isolations  Medications Medications  hydrALAZINE (APRESOLINE) injection 10 mg (10 mg Intravenous Given 03/24/23  3086)  ondansetron (ZOFRAN-ODT) disintegrating tablet 4 mg (4 mg Oral Given 03/23/23 2340)    Mobility walks with device     Focused Assessments    R Recommendations: See Admitting Provider Note  Report given to:  Jodelle Red RN   Additional Notes:

## 2023-03-24 NOTE — Consult Note (Addendum)
[facility] Nephrology Consultation Note  Requesting MD: Dr.Johnson, [place] Reason for consult: AKI, kidney transplant.  HPI:  Raymond [name] is a 62 y.o. 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 The patient reported that he was on dialysis for 6 years until he received kidney transplant in 2016.  He has been following with his nephrologist at [facility] Dr. Renaldo Harrison.  Apparently the creatinine level increased to around 3 recently therefore he underwent kidney biopsy on 10/23.  He started having hematuria therefore came to ER for further evaluation. In the ER, blood pressure is elevated.  In room air.  The labs showed potassium level 5.3, CO2 19, BUN 57, creatinine level 3.9 which is increased from 2.8 yesterday.  The baseline serum creatinine level seems to be around 1.2-1.4 with multiple episodes of AKI in the past.  Hemoglobin is 9.8 dropped from 10.9 yesterday.  The CT scan showed transplanted kidney in right lower quadrant with a large subcapsular hematoma measuring up to 3.8 cm, no intrathoracic finding.  The ER provider discussed with IR who recommended admission to the hospital preferably at the transplant center.  The EDP spoke with nephrologist at [facility] in [place] for possible transplant however there was no bed available at this time.  Meantime he is being admitted here. Per patient, he receives tacrolimus 3 mg twice a day, the dose was reduced recently.  Also on azathioprine and Myfortic per chart.  He denies nausea, vomiting, chest pain, shortness of breath.  No fever or chills.  PMHx:   Past Medical History:  Diagnosis Date   Diabetes (HCC)    Diabetes mellitus without complication (HCC)    type 2   ESRD (end stage renal disease)  (HCC)    03/09/2015- patient had a kidney transplant   Hypertension    Nausea vomiting and diarrhea 12/10/2015   Peripheral vascular disease (HCC)    Renal disorder    Renal insufficiency     Past Surgical History:  Procedure Laterality Date   ABDOMINAL AORTOGRAM W/LOWER EXTREMITY N/A 09/11/2021   Procedure: ABDOMINAL AORTOGRAM W/LOWER EXTREMITY;  Surgeon: Iran Ouch, MD;  Location: [facility];  Service: Cardiovascular;  Laterality: N/A;   AMPUTATION Right 09/25/2021   Procedure: RIGHT TRANSMETATARSAL AMPUTATION AND APPLY TISSUE GRAFT;  Surgeon: Nadara Mustard, MD;  Location[facility] OR;  Service: Orthopedics;  Laterality: Right;   AMPUTATION Right 11/16/2021   Procedure: RIGHT AMPUTATION BELOW KNEE WITH WOUND VAC APPLICATION;  Surgeon: Nadara Mustard, MD;  Location: [facility] OR;  Service: Orthopedics;  Laterality: Right;   BONE BIOPSY Right 03/06/2021   Procedure: BONE BIOPSY;  Surgeon: Vivi Barrack, DPM;  Location: [facility] ORS;  Service: Podiatry;  Laterality: Right;   CENTRAL VENOUS CATHETER INSERTION Right 01/20/2020   Procedure: INSERTION CENTRAL LINE ADULT; Tunneled central line;  Surgeon: Lucretia Roers, MD;  Location: [facility] ORS;  Service: General;  Laterality: Right;   COLONOSCOPY N/A 12/05/2014   UJW:[id number] external and internal hemorrhoid/mild diverticulosis/11 polyps removed   ESOPHAGOGASTRODUODENOSCOPY N/A 12/05/2014   NFA:OZHY duodenitis   GRAFT APPLICATION Right 03/06/2021   Procedure: GRAFT APPLICATION;  Surgeon: Vivi Barrack, DPM;  Location: [facility] ORS;  Service: Podiatry;  Laterality: Right;   INCISION AND DRAINAGE OF WOUND Right 08/15/2021   Procedure: IRRIGATION AND DEBRIDEMENT WOUND;  Surgeon:  Asencion Islam, DPM;  Location: [facility] ORS;  Service: Podiatry;  Laterality: Right;  need to make card not  a irrigation and debridement card   IR FLUORO GUIDE CV LINE RIGHT  03/01/2019   IR PERC TUN PERIT CATH WO PORT S&I /IMAG  06/27/2021   IR REMOVAL TUN CV CATH W/O FL   05/10/2019   IR REMOVAL TUN CV CATH W/O FL  02/29/2020   IR REMOVAL TUN CV CATH W/O FL  07/19/2021   IR US GUIDE VASC ACCESS RIGHT  03/01/2019   IR US GUIDE VASC ACCESS RIGHT  06/27/2021   IRRIGATION AND DEBRIDEMENT ELBOW Left 06/24/2021   Procedure: IRRIGATION AND DEBRIDEMENT ELBOW;  Surgeon: Lyndle Herrlich, MD;  Location: [facility] ORS;  Service: Orthopedics;  Laterality: Left;   KIDNEY TRANSPLANT  03/27/2015   Penile Pump Insertion     PERIPHERAL VASCULAR ATHERECTOMY  09/11/2021   Procedure: PERIPHERAL VASCULAR ATHERECTOMY;  Surgeon: Iran Ouch, MD;  Location: [facility];  Service: Cardiovascular;;  Shockwave Lithotripsy   TEE WITHOUT CARDIOVERSION N/A 01/17/2020   Procedure: TRANSESOPHAGEAL ECHOCARDIOGRAM (TEE) WITH PROPOFOL;  Surgeon: Antoine Poche, MD;  Location: [facility];  Service: Endoscopy;  Laterality: N/A;   WOUND DEBRIDEMENT Right 03/06/2021   Procedure: DEBRIDEMENT WOUND;  Surgeon: Vivi Barrack, DPM;  Location: [facility] ORS;  Service: Podiatry;  Laterality: Right;    Family Hx:  Family History  Problem Relation Age of Onset   Diabetes Mother    Heart disease Father    Colon cancer Neg Hx     Social History:  reports that he has never smoked. He has never used smokeless tobacco. He reports that he does not drink alcohol and does not use drugs.  Allergies:  Allergies  Allergen Reactions   Bactrim [Sulfamethoxazole-Trimethoprim]     Runs pt's sugar and phosphorus   Doxycycline Other (See Comments)    Runs up pt's blood sugar and phosphorus   Flagyl [Metronidazole] Rash    Patient having hives to either the vancomycin or flagyl (both infusing together).   Vancomycin Rash    Patient having hives to either vancomycin or flagyl    Medications: Prior to Admission medications   Medication Sig Start Date End Date Taking? Authorizing Provider  atorvastatin (LIPITOR) 10 MG tablet Take 1 tablet (10 mg total) by mouth daily. 05/13/22  Yes Runell Gess,  MD  Azathioprine 75 MG TABS Take 2 tablets by mouth daily.   Yes [provider]  brimonidine (ALPHAGAN) 0.2 % ophthalmic solution Place 1 drop into both eyes 3 (three) times daily. 07/25/21  Yes [provider]  cloNIDine (CATAPRES - DOSED IN MG/24 HR) 0.3 mg/24hr patch Place 1 patch (0.3 mg total) onto the skin once a week. Patient taking differently: Place 0.3 mg onto the skin once a week. Place patch on Sunday 08/21/21  Yes Runell Gess, MD  dorzolamide-timolol (COSOPT) 22.3-6.8 MG/ML ophthalmic solution Place 1 drop into both eyes 2 (two) times daily. 07/25/21  Yes [provider]  Dulaglutide (TRULICITY) 3 MG/0.5ML SOPN Inject 3 mg as directed once a week. 01/27/23  Yes Thapa, [place], MD  furosemide (LASIX) 20 MG tablet 20 mg 2 (two) times daily. 04/23/22  Yes [provider]  gabapentin (NEURONTIN) 300 MG capsule Take 3 capsules (900 mg total) by mouth 3 (three) times daily. 3 times a day 11/22/21  Yes Barnie Del R, NP  hydrALAZINE (APRESOLINE) 25 MG tablet Take 1 tablet (25 mg total)  by mouth 3 (three) times daily. 02/05/22  Yes [name], FNP  latanoprost (XALATAN) 0.005 % ophthalmic solution Place 1 drop into both eyes at bedtime. 07/25/21  Yes [provider]  Multiple Vitamin (MULTIVITAMIN) capsule Take 1 capsule by mouth daily.   Yes [provider]  mycophenolate (MYFORTIC) 360 MG TBEC EC tablet Take 720 mg by mouth 2 (two) times daily. 03/19/23  Yes [provider]  NOVOLIN N FLEXPEN 100 UNIT/ML FlexPen INJECT 30 UNITS SUBCUTANEOUSLY IN THE MORNING AND 20 UNITS with SUPPER. 01/27/23  Yes Thapa, [place], MD  nystatin (MYCOSTATIN) 100000 UNIT/ML suspension Take 5 mLs by mouth 4 (four) times daily. 03/19/23  Yes [provider]  sodium bicarbonate 650 MG tablet Take 1,300 mg by mouth 3 (three) times daily. 11/02/18  Yes [provider]  tacrolimus (PROGRAF) 1 MG capsule Take 4 capsules (4 mg total) by mouth 2 (two)  times daily. 08/30/21  Yes Rai, Ripudeep K, MD  Vitamin D, Ergocalciferol, (DRISDOL) 50000 units CAPS capsule Take 50,000 Units by mouth See admin instructions. 15th of the month   Yes [provider]  blood glucose meter kit and supplies KIT Dispense based on patient and insurance preference. Use up to four times daily as directed. (FOR ICD-9 250.00, 250.01). 04/11/20   Roma Kayser, MD  Blood Glucose Monitoring Suppl (ACCU-CHEK GUIDE ME) w/Device KIT 1 Piece by Does not apply route as directed. 02/18/18   Roma Kayser, MD  clopidogrel (PLAVIX) 75 MG tablet Take 1 tablet (75 mg total) by mouth daily. Patient not taking: Reported on 03/24/2023 02/25/23   Runell Gess, MD  Continuous Blood Gluc Receiver (DEXCOM G7 RECEIVER) DEVI Use to check blood sugar 3 times a day. Dx code e11.65 12/16/21   [name], FNP  Continuous Glucose Sensor (DEXCOM G7 SENSOR) MISC USE TO CHECK GLUCOSE THREE TIMES DAILY AS DIRECTED, CHANGE SENSOR EVERY 10 DAYS. 02/03/23   Thapa, [place], MD  doxazosin (CARDURA) 4 MG tablet Take 4 mg by mouth daily. Patient not taking: Reported on 03/24/2023 12/18/15   [provider]  glucose blood (ACCU-CHEK GUIDE) test strip Use as instructed 4 x daily. E11.65 02/19/18   Roma Kayser, MD  mupirocin ointment (BACTROBAN) 2 % Apply 1 application. topically 2 (two) times daily. For wound care 09/12/21   Stover, Titorya, DPM  NOVOLOG FLEXPEN 100 UNIT/ML FlexPen INJECT 8 UNITS SUBCUTANEOUSLY with Dinner. 01/27/23   Thapa, [place], MD  ondansetron (ZOFRAN) 4 MG tablet Take 1 tablet (4 mg total) by mouth every 6 (six) hours as needed for nausea. 11/19/21   Pokhrel, Rebekah Chesterfield, MD  predniSONE (DELTASONE) 10 MG tablet Take by mouth as directed. Patient not taking: Reported on 03/24/2023 03/19/23   [provider]    I have reviewed the patient's current medications.  Labs: Renal Panel: Recent Labs  Lab 03/23/23 2031 03/24/23 0756  NA 139 137  K  5.2* 5.3*  CL 107 107  CO2 20* 19*  GLUCOSE 220* 158*  BUN 50* 57*  CREATININE 2.85* 3.90*  CALCIUM 9.3 9.0     CBC:    Latest Ref Rng & Units 03/24/2023    7:56 AM 03/24/2023    5:41 AM 03/23/2023    8:31 PM  CBC  WBC 4.0 - 10.5 K/uL 9.2   6.8   Hemoglobin 13.0 - 17.0 g/dL 9.8  9.9  40.9   Hematocrit 39.0 - 52.0 % 33.7  33.1  37.0   Platelets 150 -  400 K/uL 174   202      Anemia Panel:  Recent Labs    10/16/22 1452 01/24/23 2203 01/24/23 2203 03/23/23 2031 03/24/23 0541 03/24/23 0756  HGB  --  12.9*  --  10.9* 9.9* 9.8*  MCV  --  80.2   < > 82.4  --  83.0  VITAMINB12 674  --   --   --   --   --   FERRITIN 195  --   --   --   --   --   TIBC 252  --   --   --   --   --   IRON 43  --   --   --   --   --    < > = values in this interval not displayed.    Recent Labs  Lab 03/23/23 2031  AST 57*  ALT 24  ALKPHOS 100  BILITOT 0.6  PROT 8.8*  ALBUMIN 3.3*    Lab Results  Component Value Date   HGBA1C 8.4 (H) 01/27/2023    ROS:  Pertinent items noted in HPI and remainder of comprehensive ROS otherwise negative.  Physical Exam: Vitals:   03/24/23 0808 03/24/23 0845  BP: (!) 171/112 (!) 152/72  Pulse:  83  Resp:  (!) 21  Temp:  99.6 F (37.6 C)  SpO2:  96%     General exam: Appears calm and comfortable  Respiratory system: Clear to auscultation. Respiratory effort normal. No wheezing or crackle Cardiovascular system: S1 & S2 heard, RRR.  No pedal edema. Gastrointestinal system: Abdomen is nondistended, soft and nontender. Normal bowel sounds heard. Central nervous system: Alert and oriented. No focal neurological deficits. Extremities: No edema, no cyanosis.. Skin: No rashes, lesions or ulcers Psychiatry: Judgement and insight appear normal. Mood & affect appropriate.   Assessment/Plan:  # Acute kidney injury on CKD IIIa; presumably due to large subcapsular hematoma in transplanted kidney with hemodynamic changes.  No hypotension, no ACE  inhibitor or ARB or other nephrotoxins identified. I recommend to transfer the patient to [facility] in [place] where he had transplant kidney biopsy done. Meantime agree with continue IV fluid, monitor hemoglobin. Continue with strict ins and out, lab monitoring and avoid nephrotoxic getting NSAIDs.  # Kidney transplant, DDRT in 2016: Resume home transplant medication including tacrolimus, azathioprine and Myfortic.  Follow-up with transplant nephrologist.  # Large subcapsular hematoma of transplant kidney post biopsy/acute blood loss anemia: Trend hemoglobin.  EDP discussed with IR.  # Mild hyperkalemia: Possibly due to hematoma.  On Lokelma and IV fluid.  # Metabolic acidosis continue sodium bicarbonate.  Discussed with the primary team. Thank you for the consult, we will continue to follow.  Maksim Peregoy Jaynie Collins 03/24/2023, 9:15 AM  [employer].  Addendum 2:24 PM. US renal transplant reviewed.  It shows little to no flow seen within the upper half of the transplant kidney and little to no venous flow throughout the kidney likely due to extrinsic compression from subcapsular hematoma.  The findings are concerning for imminent transplant failure.  I have called patient's transplant nephrologist at [facility].  He agrees to transfer patient to [facility] as soon as possible.  He will call us back after talking to the hospital about the bed situation there.  I provided primary hospitalist's contact number to him as well. Meantime, recommend to check CBC to monitor hemoglobin.  May need to transfuse as needed.  If the transfer is taking  longer then he may need to at least come to [facility] for possible vascular or IR consult.  I have discussed this with Dr. Laural Benes.  Soul Deveney Ronalee Belts, CKA

## 2023-03-24 NOTE — Assessment & Plan Note (Signed)
Blood pressure is well controlled, continue current medications.

## 2023-03-24 NOTE — Assessment & Plan Note (Signed)
he is encouraged to strive for BMI less than 30 to decrease cardiac risk. Advised to aim for at least 150 minutes of exercise per week.

## 2023-03-29 LAB — CULTURE, BLOOD (ROUTINE X 2)
Culture: NO GROWTH
Culture: NO GROWTH
Special Requests: ADEQUATE

## 2023-04-01 ENCOUNTER — Telehealth: Payer: Self-pay

## 2023-04-01 NOTE — Transitions of Care (Post Inpatient/ED Visit) (Signed)
   04/01/2023  Name: Raymond [name] MRN: [id number] DOB: [birth year]  Today's TOC FU Call Status: Today's TOC FU Call Status:: Successful TOC FU Call Completed TOC FU Call Complete Date: 04/01/23 Patient's Name and Date of Birth confirmed.  Transition Care Management Follow-up Telephone Call Date of Discharge: 03/31/23 Discharge Facility: Other (Non[facility] Facility) Name of Other ([facility]) Discharge Facility: [facility] Type of Discharge: Inpatient Admission Primary Inpatient Discharge Diagnosis:: "AKI hx of renal transplant" How have you been since you were released from the hospital?: Better (Pt states-just returned home from HD tx-will temporarily have to do HD in [facility] for the next 2wks until kidney function improves-states he did not want to transfer to local HD ctr since its only short term-tolerating txs with no issues) Any questions or concerns?: No  Items Reviewed: Did you receive and understand the discharge instructions provided?: Yes Medications obtained,verified, and reconciled?: No Medications Not Reviewed Reasons:: Other: (attempted to complete med review with pt-he voices he does not know what meds he take as wife manages meds and not currently with him) Any new allergies since your discharge?: No Dietary orders reviewed?: Yes Type of Diet Ordered:: low salt/heart healthy/carb modified/renal Do you have support at home?: Yes People in Home: spouse Name of Support/Comfort Primary Source: [name]  Medications Reviewed Today: Medications Reviewed Today   Medications were not reviewed in this encounter     Home Care and Equipment/Supplies: Were Home Health Services Ordered?: NA Any new equipment or medical supplies ordered?: NA  Functional Questionnaire: Do you need assistance with bathing/showering or dressing?: No Do you need assistance with meal preparation?: No Do you need assistance with eating?: No Do you have  difficulty maintaining continence: No Do you need assistance with getting out of bed/getting out of a chair/moving?: No Do you have difficulty managing or taking your medications?: No  Follow up appointments reviewed: PCP Follow-up appointment confirmed?: No (Pt declined making appt during call-states he will call and make appt after he gets through having to do HD for 2wks) MD Provider Line Number:[phone] Given: No Specialist Hospital Follow-up appointment confirmed?: Yes Date of Specialist follow-up appointment?: 04/03/23 Follow-Up Specialty Provider:: renal doctor w/ [facility]-pt unsure of name Do you need transportation to your follow-up appointment?: No (pt confirms he is able to drive himself to MD appts and HD txs) Do you understand care options if your condition(s) worsen?: Yes-patient verbalized understanding  SDOH Interventions Today    Flowsheet Row Most Recent Value  SDOH Interventions   Food Insecurity Interventions Intervention Not Indicated  Transportation Interventions Intervention Not Indicated      TOC interventions discussed/reviewed: -Doctor visit discussed/reviewed -PCP -Doctor visits discussed/reviewed-Specialist -Provided Verbal Education:Nutrition -Adherence to diet and fluid restriction -Pharmacy topic discussed/reviewed -Chronic disease mgmt. -DM, CKD   Antionette Fairy, RN,BSN,CCM RN Care Manager Transitions of Care  [facility]  Direct Phone: [phone] Toll Free: [phone] Fax: [phone]

## 2023-04-02 ENCOUNTER — Telehealth: Payer: Self-pay

## 2023-04-02 NOTE — Telephone Encounter (Signed)
error 

## 2023-05-03 ENCOUNTER — Emergency Department (HOSPITAL_COMMUNITY): Payer: 59

## 2023-05-03 ENCOUNTER — Encounter (HOSPITAL_COMMUNITY): Payer: Self-pay

## 2023-05-03 ENCOUNTER — Emergency Department (HOSPITAL_COMMUNITY)
Admission: EM | Admit: 2023-05-03 | Discharge: 2023-05-03 | Disposition: A | Discharge status: Home / Self Care | Payer: 59 | Attending: Emergency Medicine | Admitting: Emergency Medicine

## 2023-05-03 ENCOUNTER — Other Ambulatory Visit: Payer: Self-pay

## 2023-05-03 DIAGNOSIS — E119 Type 2 diabetes mellitus without complications: Secondary | ICD-10-CM | POA: Insufficient documentation

## 2023-05-03 DIAGNOSIS — S29011A Strain of muscle and tendon of front wall of thorax, initial encounter: Secondary | ICD-10-CM | POA: Diagnosis not present

## 2023-05-03 DIAGNOSIS — I10 Essential (primary) hypertension: Secondary | ICD-10-CM | POA: Insufficient documentation

## 2023-05-03 DIAGNOSIS — X58XXXA Exposure to other specified factors, initial encounter: Secondary | ICD-10-CM | POA: Insufficient documentation

## 2023-05-03 DIAGNOSIS — Z794 Long term (current) use of insulin: Secondary | ICD-10-CM | POA: Diagnosis not present

## 2023-05-03 DIAGNOSIS — M25511 Pain in right shoulder: Secondary | ICD-10-CM | POA: Diagnosis present

## 2023-05-03 DIAGNOSIS — Z79899 Other long term (current) drug therapy: Secondary | ICD-10-CM | POA: Insufficient documentation

## 2023-05-03 LAB — CBC WITH DIFFERENTIAL/PLATELET
Abs Immature Granulocytes: 0.05 10*3/uL (ref 0.00–0.07)
Basophils Absolute: 0 10*3/uL (ref 0.0–0.1)
Basophils Relative: 1 %
Eosinophils Absolute: 0.1 10*3/uL (ref 0.0–0.5)
Eosinophils Relative: 1 %
HCT: 33.4 % — ABNORMAL LOW (ref 39.0–52.0)
Hemoglobin: 9.2 g/dL — ABNORMAL LOW (ref 13.0–17.0)
Immature Granulocytes: 1 %
Lymphocytes Relative: 11 %
Lymphs Abs: 0.7 10*3/uL (ref 0.7–4.0)
MCH: 23.4 pg — ABNORMAL LOW (ref 26.0–34.0)
MCHC: 27.5 g/dL — ABNORMAL LOW (ref 30.0–36.0)
MCV: 85 fL (ref 80.0–100.0)
Monocytes Absolute: 0.8 10*3/uL (ref 0.1–1.0)
Monocytes Relative: 12 %
Neutro Abs: 4.7 10*3/uL (ref 1.7–7.7)
Neutrophils Relative %: 74 %
Platelets: 208 10*3/uL (ref 150–400)
RBC: 3.93 MIL/uL — ABNORMAL LOW (ref 4.22–5.81)
RDW: 16.6 % — ABNORMAL HIGH (ref 11.5–15.5)
WBC: 6.4 10*3/uL (ref 4.0–10.5)
nRBC: 0 % (ref 0.0–0.2)

## 2023-05-03 LAB — BASIC METABOLIC PANEL
Anion gap: 13 (ref 5–15)
BUN: 47 mg/dL — ABNORMAL HIGH (ref 8–23)
CO2: 23 mmol/L (ref 22–32)
Calcium: 9.1 mg/dL (ref 8.9–10.3)
Chloride: 99 mmol/L (ref 98–111)
Creatinine, Ser: 5.47 mg/dL — ABNORMAL HIGH (ref 0.61–1.24)
GFR, Estimated: 11 mL/min — ABNORMAL LOW (ref >60)
Glucose, Bld: 191 mg/dL — ABNORMAL HIGH (ref 70–99)
Potassium: 4.1 mmol/L (ref 3.5–5.1)
Sodium: 135 mmol/L (ref 135–145)

## 2023-05-03 LAB — TROPONIN I (HIGH SENSITIVITY)
Troponin I (High Sensitivity): 8 ng/L (ref <18)
Troponin I (High Sensitivity): 7 ng/L (ref <18)

## 2023-05-03 MED ORDER — LIDOCAINE 5 % EX PTCH: 1.0000 | MEDICATED_PATCH | CUTANEOUS | 0 refills | Status: DC

## 2023-05-03 MED ORDER — HYDROCODONE-ACETAMINOPHEN 5-325 MG PO TABS
1.0000 | ORAL_TABLET | Freq: Once | ORAL | Status: AC
  Administered 2023-05-03: 1 via ORAL
  Filled 2023-05-03: qty 1

## 2023-05-03 MED ORDER — HYDROCODONE-ACETAMINOPHEN 5-325 MG PO TABS: 1.0000 | ORAL_TABLET | Freq: Four times a day (QID) | ORAL | 0 refills | Status: DC | PRN

## 2023-05-03 NOTE — ED Provider Notes (Signed)
[place] EMERGENCY DEPARTMENT AT [facility] Provider Note   CSN: [id number] Arrival date & time: 05/03/23  1420     History  Chief Complaint  Patient presents with   Shoulder Pain    Raymond [name] is a 62 y.o. male.  With a extensive history including but not limited to renal insufficiency with renal transplant, peripheral vascular disease, type 2 diabetes, hypertension presenting to the ED for evaluation of right shoulder pain.  Symptoms began 3 days ago.  Denies any known trauma.  Pain is localized to the right pectoralis muscle and radiates to the right sternal border.  He also reports pain to the latissimus dorsi muscle on the right.  He denies any numbness, weakness or tingling.  No glenohumeral joint pain.  States he has a history of septic arthritis of the right shoulder and this is his main concern.  He has been taking Tylenol with minimal improvement.  He denies any fevers or chills, nausea or vomiting, shortness of breath, numbness, weakness, tingling.  Pain is exacerbated by cross body movement of the right upper extremity and pulling motions.   Shoulder Pain      Home Medications Prior to Admission medications   Medication Sig Start Date End Date Taking? Authorizing Provider  HYDROcodone-acetaminophen (NORCO/VICODIN) 5-325 MG tablet Take 1 tablet by mouth every 6 (six) hours as needed. 05/03/23  Yes Tobechukwu Emmick, Edsel Petrin, PA-C  lidocaine (LIDODERM) 5 % Place 1 patch onto the skin daily. Remove & Discard patch within 12 hours or as directed by MD 05/03/23  Yes Tatum Massman, Edsel Petrin, PA-C  atorvastatin (LIPITOR) 10 MG tablet Take 1 tablet (10 mg total) by mouth daily. 05/13/22   Runell Gess, MD  Azathioprine 75 MG TABS Take 2 tablets by mouth daily.    [provider]  blood glucose meter kit and supplies KIT Dispense based on patient and insurance preference. Use up to four times daily as directed. (FOR ICD-9 250.00, 250.01). 04/11/20   Roma Kayser, MD  Blood Glucose Monitoring Suppl (ACCU-CHEK GUIDE ME) w/Device KIT 1 Piece by Does not apply route as directed. 02/18/18   Roma Kayser, MD  brimonidine (ALPHAGAN) 0.2 % ophthalmic solution Place 1 drop into both eyes 3 (three) times daily. 07/25/21   [provider]  cloNIDine (CATAPRES - DOSED IN MG/24 HR) 0.3 mg/24hr patch Place 1 patch (0.3 mg total) onto the skin once a week. Patient taking differently: Place 0.3 mg onto the skin once a week. Place patch on Sunday 08/21/21   Runell Gess, MD  Continuous Blood Gluc Receiver (DEXCOM G7 RECEIVER) DEVI Use to check blood sugar 3 times a day. Dx code e11.65 12/16/21   [name], FNP  Continuous Glucose Sensor (DEXCOM G7 SENSOR) MISC USE TO CHECK GLUCOSE THREE TIMES DAILY AS DIRECTED, CHANGE SENSOR EVERY 10 DAYS. 02/03/23   Thapa, [place], MD  dorzolamide-timolol (COSOPT) 22.3-6.8 MG/ML ophthalmic solution Place 1 drop into both eyes 2 (two) times daily. 07/25/21   [provider]  Dulaglutide (TRULICITY) 3 MG/0.5ML SOPN Inject 3 mg as directed once a week. 01/27/23   Thapa, [place], MD  furosemide (LASIX) 20 MG tablet 20 mg 2 (two) times daily. 04/23/22   [provider]  gabapentin (NEURONTIN) 300 MG capsule Take 3 capsules (900 mg total) by mouth 3 (three) times daily. 3 times a day 11/22/21   Barnie Del R, NP  glucose blood (ACCU-CHEK GUIDE) test strip Use as instructed 4 x  daily. E11.65 02/19/18   Roma Kayser, MD  hydrALAZINE (APRESOLINE) 25 MG tablet Take 1 tablet (25 mg total) by mouth 3 (three) times daily. 02/05/22   [name], FNP  latanoprost (XALATAN) 0.005 % ophthalmic solution Place 1 drop into both eyes at bedtime. 07/25/21   [provider]  Multiple Vitamin (MULTIVITAMIN) capsule Take 1 capsule by mouth daily.    [provider]  mupirocin ointment (BACTROBAN) 2 % Apply 1 application. topically 2 (two) times daily. For wound care 09/12/21   Asencion Islam,  DPM  mycophenolate (MYFORTIC) 360 MG TBEC EC tablet Take 720 mg by mouth 2 (two) times daily. 03/19/23   [provider]  NOVOLIN N FLEXPEN 100 UNIT/ML FlexPen INJECT 30 UNITS SUBCUTANEOUSLY IN THE MORNING AND 20 UNITS with SUPPER. 01/27/23   Thapa, [place], MD  NOVOLOG FLEXPEN 100 UNIT/ML FlexPen INJECT 8 UNITS SUBCUTANEOUSLY with Dinner. 01/27/23   Thapa, [place], MD  nystatin (MYCOSTATIN) 100000 UNIT/ML suspension Take 5 mLs by mouth 4 (four) times daily. 03/19/23   [provider]  ondansetron (ZOFRAN) 4 MG tablet Take 1 tablet (4 mg total) by mouth every 6 (six) hours as needed for nausea. 11/19/21   Pokhrel, Rebekah [place], MD  sodium bicarbonate 650 MG tablet Take 1,300 mg by mouth 3 (three) times daily. 11/02/18   [provider]  tacrolimus (PROGRAF) 1 MG capsule Take 4 capsules (4 mg total) by mouth 2 (two) times daily. 08/30/21   Rai, Delene Ruffini, MD  Vitamin D, Ergocalciferol, (DRISDOL) 50000 units CAPS capsule Take 50,000 Units by mouth See admin instructions. 15th of the month    [provider]      Allergies    Bactrim [sulfamethoxazole-trimethoprim], Doxycycline, Flagyl [metronidazole], and Vancomycin    Review of Systems   Review of Systems  Cardiovascular:  Positive for chest pain.  Musculoskeletal:  Positive for arthralgias.  All other systems reviewed and are negative.   Physical Exam Updated Vital Signs BP (!) 155/85   Pulse (!) 128   Temp 99 F (37.2 C) (Oral)   Resp (!) 22   Ht 5\' 7"  (1.702 m)   Wt 97.5 kg   SpO2 100%   BMI 33.67 kg/m  Physical Exam Vitals and nursing note reviewed.  Constitutional:      General: He is not in acute distress.    Appearance: He is well-developed.     Comments: Resting comfortably in bed  HENT:     Head: Normocephalic and atraumatic.  Eyes:     Conjunctiva/sclera: Conjunctivae normal.  Cardiovascular:     Rate and Rhythm: Normal rate and regular rhythm.     Heart sounds: No murmur heard. Pulmonary:      Effort: Pulmonary effort is normal. No respiratory distress.     Breath sounds: Normal breath sounds.  Chest:     Comments: Mild TTP to lateral aspect of right pectoralis.  No overlying bruising, rashes or lesions. Abdominal:     Palpations: Abdomen is soft.     Tenderness: There is no abdominal tenderness.  Musculoskeletal:        General: No swelling.     Cervical back: Neck supple.     Comments: No TTP to right shoulder.  Full AROM in flexion, extension, abduction, adduction, internal, external rotation.  Pain exacerbated by forward flexion and internal rotation.  Radial pulses 2+ bilaterally.  Sensation intact in all digits.  Capillary refill normal.  Skin:    General: Skin is warm and dry.  Capillary Refill: Capillary refill takes less than 2 seconds.  Neurological:     Mental Status: He is alert.  Psychiatric:        Mood and Affect: Mood normal.     ED Results / Procedures / Treatments   Labs (all labs ordered are listed, but only abnormal results are displayed) Labs Reviewed  BASIC METABOLIC PANEL - Abnormal; Notable for the following components:      Result Value   Glucose, Bld 191 (*)    BUN 47 (*)    Creatinine, Ser 5.47 (*)    GFR, Estimated 11 (*)    All other components within normal limits  CBC WITH DIFFERENTIAL/PLATELET - Abnormal; Notable for the following components:   RBC 3.93 (*)    Hemoglobin 9.2 (*)    HCT 33.4 (*)    MCH 23.4 (*)    MCHC 27.5 (*)    RDW 16.6 (*)    All other components within normal limits  TROPONIN I (HIGH SENSITIVITY)  TROPONIN I (HIGH SENSITIVITY)    EKG None  Radiology DG Chest Port 1 View  Result Date: 05/03/2023 CLINICAL DATA:  Right chest pain for 3 days. EXAM: PORTABLE CHEST 1 VIEW COMPARISON:  Radiographs 03/23/2023 and 07/10/2022. Chest CT 03/23/2023. FINDINGS: Lordotic positioning with lower lung volumes. The heart size is stable at the upper limits of normal. There is mild atelectasis at both lung bases. No  confluent airspace disease, edema, pleural effusion or pneumothorax. There is avascular stent in the left subpectoral region. No acute osseous findings are evident. IMPRESSION: Low lung volumes with mild bibasilar atelectasis. No acute cardiopulmonary process. Electronically Signed   By: Carey Bullocks M.D.   On: 05/03/2023 15:10   DG Shoulder Right  Result Date: 05/03/2023 CLINICAL DATA:  Right chest pain for 3 days. Pain poles across chest and shoulder. No recent trauma. EXAM: RIGHT SHOULDER - 2+ VIEW COMPARISON:  None Available. FINDINGS: No signs of acute fracture or dislocation. Degenerative changes noted at the acromioclavicular joint. Soft tissues are unremarkable. IMPRESSION: 1. No acute findings. 2. Acromioclavicular osteoarthritis. Electronically Signed   By: Signa Kell M.D.   On: 05/03/2023 15:08    Procedures Procedures    Medications Ordered in ED Medications  HYDROcodone-acetaminophen (NORCO/VICODIN) 5-325 MG per tablet 1 tablet (1 tablet Oral Given 05/03/23 1505)    ED Course/ Medical Decision Making/ A&P                                 Medical Decision Making Amount and/or Complexity of Data Reviewed Labs: ordered. Radiology: ordered.  Risk Prescription drug management.  This patient presents to the ED for concern of right-sided chest pain, this involves an extensive number of treatment options, and is a complaint that carries with it a high risk of complications and morbidity. The emergent differential diagnosis of chest pain includes: Acute coronary syndrome, pericarditis, aortic dissection, pulmonary embolism, tension pneumothorax, and esophageal rupture.  I do not believe the patient has an emergent cause of chest pain, other urgent/non-acute considerations include, but are not limited to: chronic angina, aortic stenosis, cardiomyopathy, myocarditis, mitral valve prolapse, pulmonary hypertension, hypertrophic obstructive cardiomyopathy (HOCM), aortic  insufficiency, right ventricular hypertrophy, pneumonia, pleuritis, bronchitis, pneumothorax, tumor, gastroesophageal reflux disease (GERD), esophageal spasm, Mallory-Weiss syndrome, peptic ulcer disease, biliary disease, pancreatitis, functional gastrointestinal pain, cervical or thoracic disk disease or arthritis, shoulder arthritis, costochondritis, subacromial bursitis, anxiety or panic attack, herpes zoster,  breast disorders, chest wall tumors, thoracic outlet syndrome, mediastinitis.  My initial workup includes labs, imaging, EKG, pain control  Additional history obtained from: Nursing notes from this visit.  I ordered, reviewed and interpreted labs which include: CBC, BMP, troponin.  Creatinine of 5.47, near previous reading.  Hemoglobin 9.2 stable from previous.  No leukocytosis.  Initial delta troponin negative.  I ordered imaging studies including chest x-ray, x-ray of right shoulder I independently visualized and interpreted imaging which showed negative x-ray of the chest and right shoulder I agree with the radiologist interpretation  Cardiac Monitoring:  The patient was maintained on a cardiac monitor.  I personally viewed and interpreted the cardiac monitored which showed an underlying rhythm of: NSR  Afebrile, hypertensive but otherwise hemodynamically stable.  62 year old male presenting for evaluation of right chest pain.  Pain is localized to the pectoralis muscle and the latissimus dorsi muscle.  It is worse with activation of the pectoralis muscle.  He denies any shortness of breath.  He was initially somewhat concerned for septic arthritis of the right glenohumeral joint as he has a history of this in the past.  There is no tenderness to palpation of the glenohumeral joint.  No overlying erythema.  No warmth.  No leukocytosis or anemia.  Lower suspicion for septic arthritis.  There is mild tenderness to palpation of the pectoralis as well.  No bruising or rashes.  Lab workup  reassuring.  Initial delta troponin negative.  Chest x-ray negative.  Very low suspicion for ACS.  Overall suspect muscular strain.  He reported improvement in his symptoms after treatment in the emergency department.  He was sent a short course of hydrocodone and educated on potential side effects.  He was also sent a prescription for lidocaine patches.  He was encouraged to follow-up with his primary care provider in 1 week for reevaluation.  He was given return precautions.  Stable at discharge.  At this time there does not appear to be any evidence of an acute emergency medical condition and the patient appears stable for discharge with appropriate outpatient follow up. Diagnosis was discussed with patient who verbalizes understanding of care plan and is agreeable to discharge. I have discussed return precautions with patient who verbalizes understanding. Patient encouraged to follow-up with their PCP within 1 week. All questions answered.  Note: Portions of this report may have been transcribed using voice recognition software. Every effort was made to ensure accuracy; however, inadvertent computerized transcription errors may still be present.         Final Clinical Impression(s) / ED Diagnoses Final diagnoses:  Pectoralis muscle strain, initial encounter    Rx / DC Orders ED Discharge Orders          Ordered    HYDROcodone-acetaminophen (NORCO/VICODIN) 5-325 MG tablet  Every 6 hours PRN        05/03/23 1752    lidocaine (LIDODERM) 5 %  Every 24 hours        05/03/23 1752              Michelle Piper, Cordelia Poche 05/03/23 1756    Bethann Berkshire, MD 05/05/23 1017

## 2023-05-03 NOTE — Discharge Instructions (Addendum)
You have been seen today for your complaint of right shoulder and chest pain. Your lab work was reassuring. Your imaging was reassuring. Your discharge medications include Vicodin. This is an opioid pain medication. You should only take this medication as needed for severe pain. You should not drive, operate heavy machinery or make important decisions while taking this medication. You should use alternative methods for pain relief while taking this medication including stretching, gentle range of motion, and tylenol. Lidocaine patches.  Apply 1 patch every 24 hours as needed for pain Follow up with: Your primary care provider in 1 week for reevaluation Please seek immediate medical care if you develop any of the following symptoms: You have numbness or tingling in the injured area. You lose a lot of strength in the injured area. At this time there does not appear to be the presence of an emergent medical condition, however there is always the potential for conditions to change. Please read and follow the below instructions.  Do not take your medicine if  develop an itchy rash, swelling in your mouth or lips, or difficulty breathing; call 911 and seek immediate emergency medical attention if this occurs.  You may review your lab tests and imaging results in their entirety on your MyChart account.  Please discuss all results of fully with your primary care provider and other specialist at your follow-up visit.  Note: Portions of this text may have been transcribed using voice recognition software. Every effort was made to ensure accuracy; however, inadvertent computerized transcription errors may still be present.

## 2023-05-03 NOTE — ED Triage Notes (Signed)
RIGHT shoulder pain Pain started across chest Feels pulling across chest and other shoulder Denies recent falls or injuries   Hx of infections in muscles, feels similar to last infection

## 2023-05-03 NOTE — ED Notes (Signed)
Pt is Tuesday, Thursday, Saturday dialysis

## 2023-05-14 ENCOUNTER — Other Ambulatory Visit: Payer: Self-pay | Admitting: Nurse Practitioner

## 2023-05-18 ENCOUNTER — Other Ambulatory Visit: Payer: Self-pay

## 2023-05-18 MED ORDER — ATORVASTATIN CALCIUM 10 MG PO TABS: 10.0000 mg | ORAL_TABLET | Freq: Every day | ORAL | 0 refills | Status: DC

## 2023-06-01 ENCOUNTER — Telehealth (INDEPENDENT_AMBULATORY_CARE_PROVIDER_SITE_OTHER): Payer: BLUE CROSS/BLUE SHIELD | Admitting: Nurse Practitioner

## 2023-06-01 ENCOUNTER — Encounter: Payer: Self-pay | Admitting: Nurse Practitioner

## 2023-06-01 ENCOUNTER — Telehealth: Payer: Self-pay | Admitting: Nurse Practitioner

## 2023-06-01 DIAGNOSIS — T83410A Breakdown (mechanical) of penile (implanted) prosthesis, initial encounter: Secondary | ICD-10-CM | POA: Insufficient documentation

## 2023-06-01 NOTE — Assessment & Plan Note (Signed)
 I do not see a note from [facility] in relation to this problem. Will refer to urology for further evaluation, denies pain or urinary issues

## 2023-06-01 NOTE — Progress Notes (Signed)
 Virtual Visit via Video Note  LILLETTE Kristeen JINNY Gladis, CMA,acting as a scribe for Gaines Ada, FNP.,have documented all relevant documentation on the behalf of Gaines Ada, FNP,as directed by  Gaines Ada, FNP while in the presence of Gaines Ada, FNP.  I connected with Raymond [name] on 06/01/23 at  2:00 PM EST by a video enabled telemedicine application and verified that I am speaking with the correct person using two identifiers.  Patient Location: Home Provider Location: Home Office  I discussed the limitations, risks, security, and privacy concerns of performing an evaluation and management service by video and the availability of in person appointments. I also discussed with the patient that there may be a patient responsible charge related to this service. The patient expressed understanding and agreed to proceed.  Subjective: PCP: Ada Gaines, FNP  No chief complaint on file.  Patient presents today for a malfunction in penis pump issues. Patient reports the valve on penis pump is broken and will not go down. Patient reports it has been 3 days- patient was encouraged to go to the ER patient stated he did go yesterday and they didn't know how to help him. He went to [facility] ER 2 weeks ago for the same issue and it took about 3 days to go down. Now the pump is locked. Due to his diabetes he had a penile implant placed while he was living in [place] more than 20 years. Denies pain. Denies urinary symptoms.     ROS: Per HPI  Current Outpatient Medications:    atorvastatin  (LIPITOR) 10 MG tablet, Take 1 tablet (10 mg total) by mouth daily., Disp: 30 tablet, Rfl: 0   Azathioprine  75 MG TABS, Take 2 tablets by mouth daily., Disp: , Rfl:    blood glucose meter kit and supplies KIT, Dispense based on patient and insurance preference. Use up to four times daily as directed. (FOR ICD-9 250.00, 250.01)., Disp: 1 each, Rfl: 0   Blood Glucose Monitoring Suppl (ACCU-CHEK GUIDE ME)  w/Device KIT, 1 Piece by Does not apply route as directed., Disp: 1 kit, Rfl: 0   brimonidine  (ALPHAGAN ) 0.2 % ophthalmic solution, Place 1 drop into both eyes 3 (three) times daily., Disp: , Rfl:    cloNIDine  (CATAPRES  - DOSED IN MG/24 HR) 0.3 mg/24hr patch, Place 1 patch (0.3 mg total) onto the skin once a week. (Patient taking differently: Place 0.3 mg onto the skin once a week. Place patch on Sunday), Disp: 4 patch, Rfl: 12   Continuous Blood Gluc Receiver (DEXCOM G7 RECEIVER) DEVI, Use to check blood sugar 3 times a day. Dx code e11.65, Disp: 1 each, Rfl: 1   Continuous Glucose Sensor (DEXCOM G7 SENSOR) MISC, USE TO CHECK GLUCOSE THREE TIMES DAILY AS DIRECTED, CHANGE SENSOR EVERY 10 DAYS., Disp: 3 each, Rfl: 5   dorzolamide -timolol  (COSOPT ) 22.3-6.8 MG/ML ophthalmic solution, Place 1 drop into both eyes 2 (two) times daily., Disp: , Rfl:    Dulaglutide  (TRULICITY ) 3 MG/0.5ML SOPN, Inject 3 mg as directed once a week., Disp: 6 mL, Rfl: 4   furosemide  (LASIX ) 20 MG tablet, 20 mg 2 (two) times daily., Disp: , Rfl:    gabapentin  (NEURONTIN ) 300 MG capsule, Take 3 capsules (900 mg total) by mouth 3 (three) times daily. 3 times a day, Disp: 270 capsule, Rfl: 3   glucose blood (ACCU-CHEK GUIDE) test strip, Use as instructed 4 x daily. E11.65, Disp: 150 each, Rfl: 5   hydrALAZINE  (APRESOLINE ) 25 MG tablet, Take 1  tablet (25 mg total) by mouth 3 (three) times daily., Disp: 270 tablet, Rfl: 1   HYDROcodone -acetaminophen  (NORCO/VICODIN) 5-325 MG tablet, Take 1 tablet by mouth every 6 (six) hours as needed., Disp: 6 tablet, Rfl: 0   latanoprost  (XALATAN ) 0.005 % ophthalmic solution, Place 1 drop into both eyes at bedtime., Disp: , Rfl:    lidocaine  (LIDODERM ) 5 %, Place 1 patch onto the skin daily. Remove & Discard patch within 12 hours or as directed by MD, Disp: 7 patch, Rfl: 0   Multiple Vitamin (MULTIVITAMIN) capsule, Take 1 capsule by mouth daily., Disp: , Rfl:    mupirocin  ointment (BACTROBAN ) 2 %,  Apply 1 application. topically 2 (two) times daily. For wound care, Disp: 30 g, Rfl: 0   mycophenolate  (MYFORTIC ) 360 MG TBEC EC tablet, Take 720 mg by mouth 2 (two) times daily., Disp: , Rfl:    NOVOLIN  N FLEXPEN 100 UNIT/ML FlexPen, INJECT 30 UNITS SUBCUTANEOUSLY IN THE MORNING AND 20 UNITS with SUPPER., Disp: 30 mL, Rfl: 3   NOVOLOG  FLEXPEN 100 UNIT/ML FlexPen, INJECT 8 UNITS SUBCUTANEOUSLY with Dinner., Disp: 15 mL, Rfl: 3   nystatin  (MYCOSTATIN ) 100000 UNIT/ML suspension, Take 5 mLs by mouth 4 (four) times daily., Disp: , Rfl:    ondansetron  (ZOFRAN ) 4 MG tablet, Take 1 tablet (4 mg total) by mouth every 6 (six) hours as needed for nausea., Disp: 20 tablet, Rfl: 0   sodium bicarbonate  650 MG tablet, Take 1,300 mg by mouth 3 (three) times daily., Disp: , Rfl:    tacrolimus  (PROGRAF ) 1 MG capsule, Take 4 capsules (4 mg total) by mouth 2 (two) times daily., Disp: , Rfl:    Vitamin D , Ergocalciferol , (DRISDOL ) 50000 units CAPS capsule, Take 50,000 Units by mouth See admin instructions. 15th of the month, Disp: , Rfl:   Current Facility-Administered Medications:    sodium chloride  flush (NS) 0.9 % injection 3 mL, 3 mL, Intravenous, Q12H, Arida, Deatrice LABOR, MD  Observations/Objective: There were no vitals filed for this visit.  Review of Systems  Constitutional: Negative.   Respiratory: Negative.    Cardiovascular: Negative.   Genitourinary:        Penile implant will not go down - locked per patient  Neurological: Negative.   Psychiatric/Behavioral: Negative.      Physical Exam Vitals reviewed.  Constitutional:      General: He is not in acute distress.    Appearance: Normal appearance. He is obese.  Pulmonary:     Effort: Pulmonary effort is normal. No respiratory distress.  Neurological:     General: No focal deficit present.     Mental Status: He is alert and oriented to person, place, and time.     Cranial Nerves: No cranial nerve deficit.     Motor: No weakness.      Assessment and Plan: Failure of penile implant, initial encounter [facility]) Assessment & Plan: I do not see a note from [facility] in relation to this problem. Will refer to urology for further evaluation, denies pain or urinary issues  Orders: -     Ambulatory referral to Urology    Follow Up Instructions: No follow-ups on file.   I discussed the assessment and treatment plan with the patient. The patient was provided an opportunity to ask questions, and all were answered. The patient agreed with the plan and demonstrated an understanding of the instructions.   The patient was advised to call back or seek an in-person evaluation if the symptoms worsen or if  the condition fails to improve as anticipated.  The above assessment and management plan was discussed with the patient. The patient verbalized understanding of and has agreed to the management plan.   LILLETTE Gaines Ada, FNP, have reviewed all documentation for this visit. The documentation on 06/01/23 for the exam, diagnosis, procedures, and orders are all accurate and complete.

## 2023-06-02 ENCOUNTER — Ambulatory Visit (INDEPENDENT_AMBULATORY_CARE_PROVIDER_SITE_OTHER): Payer: BLUE CROSS/BLUE SHIELD | Admitting: Urology

## 2023-06-02 VITALS — BP 132/75 | HR 89

## 2023-06-02 DIAGNOSIS — T83410A Breakdown (mechanical) of penile (implanted) prosthesis, initial encounter: Secondary | ICD-10-CM

## 2023-06-02 LAB — URINALYSIS, ROUTINE W REFLEX MICROSCOPIC
Bilirubin, UA: NEGATIVE
Ketones, UA: NEGATIVE
Nitrite, UA: NEGATIVE
Specific Gravity, UA: 1.02 (ref 1.005–1.030)
Urobilinogen, Ur: 0.2 mg/dL (ref 0.2–1.0)
pH, UA: 6 (ref 5.0–7.5)

## 2023-06-02 LAB — MICROSCOPIC EXAMINATION: WBC, UA: >30 /[HPF] — AB (ref 0–5)

## 2023-06-02 NOTE — Progress Notes (Signed)
 06/02/2023 9:35 AM   Helayne Reeves [place] 1961/01/20 [id number]  Referring provider: Georgina Speaks, FNP [address] [place],  [place] [address]  No chief complaint on file.   HPI: 63 year old male with diabetes, end-stage renal disease who has had a cadaveric renal transplant about 6 years ago (now back on dialysis) comes in today with a penile implant which will not deflate.  This was placed in [name]  about 20 years ago.  It has been inflated now for 6 to 7 days.  He is experiencing mild discomfort.  This is never happened before.    PMH: Past Medical History:  Diagnosis Date   Diabetes (HCC)    Diabetes mellitus without complication (HCC)    type 2   ESRD (end stage renal disease) (HCC)    03/09/2015- patient had a kidney transplant   Hypertension    Nausea vomiting and diarrhea 12/10/2015   Peripheral vascular disease (HCC)    Renal disorder    Renal insufficiency     Surgical History: Past Surgical History:  Procedure Laterality Date   ABDOMINAL AORTOGRAM W/LOWER EXTREMITY N/A 09/11/2021   Procedure: ABDOMINAL AORTOGRAM W/LOWER EXTREMITY;  Surgeon: Darron Deatrice LABOR, MD;  Location: [facility];  Service: Cardiovascular;  Laterality: N/A;   AMPUTATION Right 09/25/2021   Procedure: RIGHT TRANSMETATARSAL AMPUTATION AND APPLY TISSUE GRAFT;  Surgeon: Harden Jerona GAILS, MD;  Location: [facility];  Service: Orthopedics;  Laterality: Right;   AMPUTATION Right 11/16/2021   Procedure: RIGHT AMPUTATION BELOW KNEE WITH WOUND VAC APPLICATION;  Surgeon: Harden Jerona GAILS, MD;  Location: [facility] OR;  Service: Orthopedics;  Laterality: Right;   BONE BIOPSY Right 03/06/2021   Procedure: BONE BIOPSY;  Surgeon: Gershon Donnice SAUNDERS, DPM;  Location: [facility];  Service: Podiatry;  Laterality: Right;   CENTRAL VENOUS CATHETER INSERTION Right 01/20/2020   Procedure: INSERTION CENTRAL LINE ADULT; Tunneled central line;  Surgeon: Kallie Manuelita BROCKS, MD;  Location: [facility];  Service:  General;  Laterality: Right;   COLONOSCOPY N/A 12/05/2014   DOQ:fnizmjuz external and internal hemorrhoid/mild diverticulosis/11 polyps removed   ESOPHAGOGASTRODUODENOSCOPY N/A 12/05/2014   DOQ:fpoi duodenitis   GRAFT APPLICATION Right 03/06/2021   Procedure: GRAFT APPLICATION;  Surgeon: Gershon Donnice SAUNDERS, DPM;  Location: [facility];  Service: Podiatry;  Laterality: Right;   INCISION AND DRAINAGE OF WOUND Right 08/15/2021   Procedure: IRRIGATION AND DEBRIDEMENT WOUND;  Surgeon: Burt Fus, DPM;  Location: [facility];  Service: Podiatry;  Laterality: Right;  need to make card not  a irrigation and debridement card   IR FLUORO GUIDE CV LINE RIGHT  03/01/2019   IR PERC TUN PERIT CATH WO PORT S&I /IMAG  06/27/2021   IR REMOVAL TUN CV CATH W/O FL  05/10/2019   IR REMOVAL TUN CV CATH W/O FL  02/29/2020   IR REMOVAL TUN CV CATH W/O FL  07/19/2021   IR US  GUIDE VASC ACCESS RIGHT  03/01/2019   IR US  GUIDE VASC ACCESS RIGHT  06/27/2021   IRRIGATION AND DEBRIDEMENT ELBOW Left 06/24/2021   Procedure: IRRIGATION AND DEBRIDEMENT ELBOW;  Surgeon: Leora Lynwood SAUNDERS, MD;  Location: [facility] ORS;  Service: Orthopedics;  Laterality: Left;   KIDNEY TRANSPLANT  03/27/2015   Penile Pump Insertion     PERIPHERAL VASCULAR ATHERECTOMY  09/11/2021   Procedure: PERIPHERAL VASCULAR ATHERECTOMY;  Surgeon: Darron Deatrice LABOR, MD;  Location: [facility];  Service: Cardiovascular;;  Shockwave Lithotripsy   TEE WITHOUT CARDIOVERSION N/A 01/17/2020   Procedure: TRANSESOPHAGEAL  ECHOCARDIOGRAM (TEE) WITH PROPOFOL ;  Surgeon: Alvan Dorn FALCON, MD;  [facility]: [facility];  Service: Endoscopy;  Laterality: N/A;   WOUND DEBRIDEMENT Right 03/06/2021   Procedure: DEBRIDEMENT WOUND;  Surgeon: Gershon Donnice SAUNDERS, DPM;  Location: [facility];  Service: Podiatry;  Laterality: Right;    Home Medications:  Allergies as of 06/02/2023       Reactions   Bactrim  [sulfamethoxazole -trimethoprim ]    Runs pt's sugar and phosphorus   Doxycycline  Other  (See Comments)   Runs up pt's blood sugar and phosphorus   Flagyl  [metronidazole ] Rash   Patient having hives to either the vancomycin  or flagyl  (both infusing together).   Vancomycin  Rash   Patient having hives to either vancomycin  or flagyl         Medication List        Accurate as of June 02, 2023  9:35 AM. If you have any questions, ask your nurse or doctor.          Accu-Chek Guide Me w/Device Kit 1 Piece by Does not apply route as directed.   atorvastatin  10 MG tablet Commonly known as: LIPITOR Take 1 tablet (10 mg total) by mouth daily.   Azathioprine  75 MG Tabs Take 2 tablets by mouth daily.   blood glucose meter kit and supplies Kit Dispense based on patient and insurance preference. Use up to four times daily as directed. (FOR ICD-9 250.00, 250.01).   brimonidine  0.2 % ophthalmic solution Commonly known as: ALPHAGAN  Place 1 drop into both eyes 3 (three) times daily.   cloNIDine  0.3 mg/24hr patch Commonly known as: CATAPRES  - Dosed in mg/24 hr Place 1 patch (0.3 mg total) onto the skin once a week. What changed: additional instructions   Dexcom G7 Receiver Devi Use to check blood sugar 3 times a day. Dx code e11.65   Dexcom G7 Sensor Misc USE TO CHECK GLUCOSE THREE TIMES DAILY AS DIRECTED, CHANGE SENSOR EVERY 10 DAYS.   dorzolamide -timolol  2-0.5 % ophthalmic solution Commonly known as: COSOPT  Place 1 drop into both eyes 2 (two) times daily.   furosemide  20 MG tablet Commonly known as: LASIX  20 mg 2 (two) times daily.   gabapentin  300 MG capsule Commonly known as: NEURONTIN  Take 3 capsules (900 mg total) by mouth 3 (three) times daily. 3 times a day   glucose blood test strip Commonly known as: Accu-Chek Guide Use as instructed 4 x daily. E11.65   hydrALAZINE  25 MG tablet Commonly known as: APRESOLINE  Take 1 tablet (25 mg total) by mouth 3 (three) times daily.   HYDROcodone -acetaminophen  5-325 MG tablet Commonly known as:  NORCO/VICODIN Take 1 tablet by mouth every 6 (six) hours as needed.   latanoprost  0.005 % ophthalmic solution Commonly known as: XALATAN  Place 1 drop into both eyes at bedtime.   lidocaine  5 % Commonly known as: Lidoderm  Place 1 patch onto the skin daily. Remove & Discard patch within 12 hours or as directed by MD   multivitamin capsule Take 1 capsule by mouth daily.   mupirocin  ointment 2 % Commonly known as: BACTROBAN  Apply 1 application. topically 2 (two) times daily. For wound care   mycophenolate  360 MG Tbec EC tablet Commonly known as: MYFORTIC  Take 720 mg by mouth 2 (two) times daily.   NovoLIN  N FlexPen 100 UNIT/ML FlexPen Generic drug: Insulin  NPH (Human) (Isophane) INJECT 30 UNITS SUBCUTANEOUSLY IN THE MORNING AND 20 UNITS with SUPPER.   NovoLOG  FlexPen 100 UNIT/ML FlexPen Generic drug: insulin  aspart INJECT 8 UNITS SUBCUTANEOUSLY with Dinner.  nystatin  100000 UNIT/ML suspension Commonly known as: MYCOSTATIN  Take 5 mLs by mouth 4 (four) times daily.   ondansetron  4 MG tablet Commonly known as: ZOFRAN  Take 1 tablet (4 mg total) by mouth every 6 (six) hours as needed for nausea.   sodium bicarbonate  650 MG tablet Take 1,300 mg by mouth 3 (three) times daily.   tacrolimus  1 MG capsule Commonly known as: PROGRAF  Take 4 capsules (4 mg total) by mouth 2 (two) times daily.   Trulicity  3 MG/0.5ML Soaj Generic drug: Dulaglutide  Inject 3 mg as directed once a week.   Vitamin D  (Ergocalciferol ) 1.25 MG (50000 UNIT) Caps capsule Commonly known as: DRISDOL  Take 50,000 Units by mouth See admin instructions. 15th of the month        Allergies:  Allergies  Allergen Reactions   Bactrim  [Sulfamethoxazole -Trimethoprim ]     Runs pt's sugar and phosphorus   Doxycycline  Other (See Comments)    Runs up pt's blood sugar and phosphorus   Flagyl  [Metronidazole ] Rash    Patient having hives to either the vancomycin  or flagyl  (both infusing together).   Vancomycin   Rash    Patient having hives to either vancomycin  or flagyl     Family History: Family History  Problem Relation Age of Onset   Diabetes Mother    Heart disease Father    Colon cancer Neg Hx     Social History:  reports that he has never smoked. He has never used smokeless tobacco. He reports that he does not drink alcohol and does not use drugs.  ROS: All other review of systems were reviewed and are negative except what is noted above in HPI  Physical Exam: There were no vitals taken for this visit.  Constitutional:  Alert and oriented, No acute distress. HEENT: [place] AT, moist mucus membranes.  Trachea midline, no masses. Cardiovascular: No clubbing, cyanosis, or edema. Respiratory: Normal respiratory effort, no increased work of breathing. GI: Abdomen is obese GU: Circumcised phallus.  Implant in place, inflated.  No evidence of infection of his implant. Lymph: No cervical or inguinal lymphadenopathy. Skin: No rashes, bruises or suspicious lesions. Neurologic: Grossly intact, no focal deficits, moving all 4 extremities. Psychiatric: Normal mood and affect.  Laboratory Data: Lab Results  Component Value Date   WBC 6.4 05/03/2023   HGB 9.2 (L) 05/03/2023   HCT 33.4 (L) 05/03/2023   MCV 85.0 05/03/2023   PLT 208 05/03/2023    Lab Results  Component Value Date   CREATININE 5.47 (H) 05/03/2023    No results found for: PSA  No results found for: TESTOSTERONE  Lab Results  Component Value Date   HGBA1C 8.4 (H) 01/27/2023    Urinalysis    Component Value Date/Time   COLORURINE RED (A) 03/23/2023 0020   APPEARANCEUR CLOUDY (A) 03/23/2023 0020   LABSPEC 1.004 (L) 03/23/2023 0020   PHURINE 6.0 03/23/2023 0020   GLUCOSEU 50 (A) 03/23/2023 0020   HGBUR LARGE (A) 03/23/2023 0020   BILIRUBINUR NEGATIVE 03/23/2023 0020   BILIRUBINUR negative 12/12/2019 1715   KETONESUR NEGATIVE 03/23/2023 0020   PROTEINUR 100 (A) 03/23/2023 0020   UROBILINOGEN 0.2 12/12/2019  1715   NITRITE NEGATIVE 03/23/2023 0020   LEUKOCYTESUR LARGE (A) 03/23/2023 0020    Lab Results  Component Value Date   LABMICR 171.1 03/12/2023   BACTERIA FEW (A) 03/23/2023    PCP records reviewed  Nephrology records reviewed  Most recent laboratories were reviewed  Pump easily deflated, reinflated and deflated again.  Seems to be  working well   Assessment & Plan:   Temporary malfunction of implanted penile prosthesis, functioning normally now  I will have him come back on an as-needed basis.  I did let him know that his implant may be reaching its end-of-life There are no diagnoses linked to this encounter.  No follow-ups on file.  Garnette CHRISTELLA Shack, MD  [facility] [place]

## 2023-06-07 ENCOUNTER — Encounter (HOSPITAL_COMMUNITY): Payer: Self-pay

## 2023-06-07 ENCOUNTER — Emergency Department (HOSPITAL_COMMUNITY): Payer: BLUE CROSS/BLUE SHIELD

## 2023-06-07 ENCOUNTER — Inpatient Hospital Stay (HOSPITAL_COMMUNITY)
Admission: EM | Admit: 2023-06-07 | Discharge: 2023-06-12 | DRG: 548 | Disposition: A | Discharge status: Home Health | Payer: BLUE CROSS/BLUE SHIELD | Attending: Family Medicine | Admitting: Family Medicine

## 2023-06-07 ENCOUNTER — Other Ambulatory Visit: Payer: Self-pay

## 2023-06-07 DIAGNOSIS — Z882 Allergy status to sulfonamides status: Secondary | ICD-10-CM

## 2023-06-07 DIAGNOSIS — Z89511 Acquired absence of right leg below knee: Secondary | ICD-10-CM

## 2023-06-07 DIAGNOSIS — D849 Immunodeficiency, unspecified: Secondary | ICD-10-CM | POA: Diagnosis present

## 2023-06-07 DIAGNOSIS — E1151 Type 2 diabetes mellitus with diabetic peripheral angiopathy without gangrene: Secondary | ICD-10-CM | POA: Diagnosis present

## 2023-06-07 DIAGNOSIS — Z79899 Other long term (current) drug therapy: Secondary | ICD-10-CM

## 2023-06-07 DIAGNOSIS — Z940 Kidney transplant status: Secondary | ICD-10-CM | POA: Diagnosis not present

## 2023-06-07 DIAGNOSIS — Z992 Dependence on renal dialysis: Secondary | ICD-10-CM

## 2023-06-07 DIAGNOSIS — E1059 Type 1 diabetes mellitus with other circulatory complications: Secondary | ICD-10-CM | POA: Insufficient documentation

## 2023-06-07 DIAGNOSIS — I120 Hypertensive chronic kidney disease with stage 5 chronic kidney disease or end stage renal disease: Secondary | ICD-10-CM | POA: Diagnosis present

## 2023-06-07 DIAGNOSIS — M009 Pyogenic arthritis, unspecified: Principal | ICD-10-CM | POA: Diagnosis present

## 2023-06-07 DIAGNOSIS — E785 Hyperlipidemia, unspecified: Secondary | ICD-10-CM | POA: Diagnosis present

## 2023-06-07 DIAGNOSIS — D631 Anemia in chronic kidney disease: Secondary | ICD-10-CM | POA: Diagnosis present

## 2023-06-07 DIAGNOSIS — E1165 Type 2 diabetes mellitus with hyperglycemia: Secondary | ICD-10-CM | POA: Diagnosis present

## 2023-06-07 DIAGNOSIS — Z7902 Long term (current) use of antithrombotics/antiplatelets: Secondary | ICD-10-CM

## 2023-06-07 DIAGNOSIS — N2581 Secondary hyperparathyroidism of renal origin: Secondary | ICD-10-CM | POA: Diagnosis present

## 2023-06-07 DIAGNOSIS — Z8249 Family history of ischemic heart disease and other diseases of the circulatory system: Secondary | ICD-10-CM

## 2023-06-07 DIAGNOSIS — E1122 Type 2 diabetes mellitus with diabetic chronic kidney disease: Secondary | ICD-10-CM | POA: Diagnosis present

## 2023-06-07 DIAGNOSIS — E8889 Other specified metabolic disorders: Secondary | ICD-10-CM | POA: Diagnosis present

## 2023-06-07 DIAGNOSIS — Z79621 Long term (current) use of calcineurin inhibitor: Secondary | ICD-10-CM | POA: Diagnosis not present

## 2023-06-07 DIAGNOSIS — M25561 Pain in right knee: Secondary | ICD-10-CM | POA: Diagnosis present

## 2023-06-07 DIAGNOSIS — I129 Hypertensive chronic kidney disease with stage 1 through stage 4 chronic kidney disease, or unspecified chronic kidney disease: Secondary | ICD-10-CM

## 2023-06-07 DIAGNOSIS — Z883 Allergy status to other anti-infective agents status: Secondary | ICD-10-CM | POA: Diagnosis not present

## 2023-06-07 DIAGNOSIS — N186 End stage renal disease: Secondary | ICD-10-CM | POA: Diagnosis present

## 2023-06-07 DIAGNOSIS — Z794 Long term (current) use of insulin: Secondary | ICD-10-CM

## 2023-06-07 DIAGNOSIS — Z833 Family history of diabetes mellitus: Secondary | ICD-10-CM | POA: Diagnosis not present

## 2023-06-07 DIAGNOSIS — Z7985 Long-term (current) use of injectable non-insulin antidiabetic drugs: Secondary | ICD-10-CM

## 2023-06-07 DIAGNOSIS — I10 Essential (primary) hypertension: Secondary | ICD-10-CM | POA: Insufficient documentation

## 2023-06-07 DIAGNOSIS — Z881 Allergy status to other antibiotic agents status: Secondary | ICD-10-CM

## 2023-06-07 LAB — CBC
HCT: 32.8 % — ABNORMAL LOW (ref 39.0–52.0)
Hemoglobin: 9.1 g/dL — ABNORMAL LOW (ref 13.0–17.0)
MCH: 23 pg — ABNORMAL LOW (ref 26.0–34.0)
MCHC: 27.7 g/dL — ABNORMAL LOW (ref 30.0–36.0)
MCV: 83 fL (ref 80.0–100.0)
Platelets: 262 10*3/uL (ref 150–400)
RBC: 3.95 MIL/uL — ABNORMAL LOW (ref 4.22–5.81)
RDW: 17.1 % — ABNORMAL HIGH (ref 11.5–15.5)
WBC: 6 10*3/uL (ref 4.0–10.5)
nRBC: 0 % (ref 0.0–0.2)

## 2023-06-07 LAB — BASIC METABOLIC PANEL
Anion gap: 9 (ref 5–15)
BUN: 40 mg/dL — ABNORMAL HIGH (ref 8–23)
CO2: 26 mmol/L (ref 22–32)
Calcium: 8.9 mg/dL (ref 8.9–10.3)
Chloride: 101 mmol/L (ref 98–111)
Creatinine, Ser: 3.51 mg/dL — ABNORMAL HIGH (ref 0.61–1.24)
GFR, Estimated: 19 mL/min — ABNORMAL LOW (ref >60)
Glucose, Bld: 93 mg/dL (ref 70–99)
Potassium: 4 mmol/L (ref 3.5–5.1)
Sodium: 136 mmol/L (ref 135–145)

## 2023-06-07 LAB — SYNOVIAL CELL COUNT + DIFF, W/ CRYSTALS
Crystals, Fluid: NONE SEEN
Eosinophils-Synovial: 0 % (ref 0–1)
Lymphocytes-Synovial Fld: 0 % (ref 0–20)
Monocyte-Macrophage-Synovial Fluid: 4 % — ABNORMAL LOW (ref 50–90)
Neutrophil, Synovial: 96 % — ABNORMAL HIGH (ref 0–25)
Other Cells-SYN: 0
WBC, Synovial: 81450 /mm3 — ABNORMAL HIGH (ref 0–200)

## 2023-06-07 LAB — SEDIMENTATION RATE: Sed Rate: 120 mm/h — ABNORMAL HIGH (ref 0–16)

## 2023-06-07 MED ORDER — VANCOMYCIN HCL IN DEXTROSE 1-5 GM/200ML-% IV SOLN
1000.0000 mg | Freq: Once | INTRAVENOUS | Status: AC
  Administered 2023-06-08: 1000 mg via INTRAVENOUS
  Filled 2023-06-07: qty 200

## 2023-06-07 MED ORDER — VANCOMYCIN HCL IN DEXTROSE 1-5 GM/200ML-% IV SOLN
1000.0000 mg | Freq: Once | INTRAVENOUS | Status: AC
  Administered 2023-06-07: 1000 mg via INTRAVENOUS
  Filled 2023-06-07: qty 200

## 2023-06-07 MED ORDER — LIDOCAINE HCL (PF) 1 % IJ SOLN
30.0000 mL | Freq: Once | INTRAMUSCULAR | Status: AC
  Administered 2023-06-07: 30 mL
  Filled 2023-06-07: qty 30

## 2023-06-07 MED ORDER — ONDANSETRON 4 MG PO TBDP
4.0000 mg | ORAL_TABLET | Freq: Once | ORAL | Status: AC
  Administered 2023-06-07: 4 mg via ORAL
  Filled 2023-06-07: qty 1

## 2023-06-07 MED ORDER — OXYCODONE-ACETAMINOPHEN 5-325 MG PO TABS
2.0000 | ORAL_TABLET | Freq: Once | ORAL | Status: AC
  Administered 2023-06-07: 2 via ORAL
  Filled 2023-06-07: qty 2

## 2023-06-07 NOTE — Progress Notes (Signed)
 ED Pharmacy Antibiotic Sign Off An antibiotic consult was received from an ED provider for vancomycin  per pharmacy dosing for septic arthritis. A chart review was completed to assess appropriateness.   The following one time order(s) were placed:  Vancomycin  2g (two 1g bags to be infused one after another)  Further antibiotic and/or antibiotic pharmacy consults should be ordered by the admitting provider if indicated.   Thank you for allowing pharmacy to be a part of this patient's care.   Leonor GORMAN Bash, [facility] [address]  Clinical Pharmacist 06/07/23 11:13 PM

## 2023-06-07 NOTE — H&P (Signed)
 History and Physical    Patient: Raymond [name] FMW:[id number] DOB: 08-16-1960 DOA: 06/07/2023 DOS: the patient was seen and examined on 06/08/2023 PCP: Georgina Speaks, FNP  Patient coming from: Home  Chief Complaint:  Chief Complaint  Patient presents with   Knee Pain   HPI: Raymond [name] is a 63 y.o. male with medical history significant of hypertension, ESRD on HD (TTS) status post kidney transplant (03/09/2015) and now back on dialysis, T2DM, hypertension, hyperlipidemia, right BKA who presents to the emergency department due to 1 month history of right knee pain that has progressively worsened.  Patient complained of worsening pain today, he complained of an aching pressure and throbbing pain of the right knee, so he decided to go to the ED for further evaluation and management.  ED Course:  In the emergency department, BP was 144/87, other vital signs are within normal range.  Workup in the ED showed normocytic anemia, BMP was normal except for BUN 40, creatinine 3.51, EGFR 19, sed rate 120, synovial fluid joint with 81,450 WBCs. Right knee x-ray showed status post below-knee amputation and no fracture or dislocation. He was treated with IV vancomycin , Percocet and Zofran .  Hospitalist was asked to admit patient for further evaluation and management.   Review of Systems: Review of systems as noted in the HPI. All other systems reviewed and are negative.   Past Medical History:  Diagnosis Date   Diabetes (HCC)    Diabetes mellitus without complication (HCC)    type 2   ESRD (end stage renal disease) (HCC)    03/09/2015- patient had a kidney transplant   Hypertension    Nausea vomiting and diarrhea 12/10/2015   Peripheral vascular disease (HCC)    Renal disorder    Renal insufficiency    Past Surgical History:  Procedure Laterality Date   ABDOMINAL AORTOGRAM W/LOWER EXTREMITY N/A 09/11/2021   Procedure: ABDOMINAL AORTOGRAM W/LOWER EXTREMITY;  Surgeon:  Darron Deatrice LABOR, MD;  Location: [facility];  Service: Cardiovascular;  Laterality: N/A;   AMPUTATION Right 09/25/2021   Procedure: RIGHT TRANSMETATARSAL AMPUTATION AND APPLY TISSUE GRAFT;  Surgeon: Harden Jerona GAILS, MD;  Location: [facility] OR;  Service: Orthopedics;  Laterality: Right;   AMPUTATION Right 11/16/2021   Procedure: RIGHT AMPUTATION BELOW KNEE WITH WOUND VAC APPLICATION;  Surgeon: Harden Jerona GAILS, MD;  Location: [facility] OR;  Service: Orthopedics;  Laterality: Right;   BONE BIOPSY Right 03/06/2021   Procedure: BONE BIOPSY;  Surgeon: Gershon Donnice SAUNDERS, DPM;  Location: [facility];  Service: Podiatry;  Laterality: Right;   CENTRAL VENOUS CATHETER INSERTION Right 01/20/2020   Procedure: INSERTION CENTRAL LINE ADULT; Tunneled central line;  Surgeon: Kallie Manuelita BROCKS, MD;  Location: [facility];  Service: General;  Laterality: Right;   COLONOSCOPY N/A 12/05/2014   DOQ:fnizmjuz external and internal hemorrhoid/mild diverticulosis/11 polyps removed   ESOPHAGOGASTRODUODENOSCOPY N/A 12/05/2014   DOQ:fpoi duodenitis   GRAFT APPLICATION Right 03/06/2021   Procedure: GRAFT APPLICATION;  Surgeon: Gershon Donnice SAUNDERS, DPM;  Location: [facility];  Service: Podiatry;  Laterality: Right;   INCISION AND DRAINAGE OF WOUND Right 08/15/2021   Procedure: IRRIGATION AND DEBRIDEMENT WOUND;  Surgeon: Burt Fus, DPM;  Location: [facility];  Service: Podiatry;  Laterality: Right;  need to make card not  a irrigation and debridement card   IR FLUORO GUIDE CV LINE RIGHT  03/01/2019   IR PERC TUN PERIT CATH WO PORT S&I /IMAG  06/27/2021   IR REMOVAL TUN CV CATH W/O FL  05/10/2019  IR REMOVAL TUN CV CATH W/O FL  02/29/2020   IR REMOVAL TUN CV CATH W/O FL  07/19/2021   IR US  GUIDE VASC ACCESS RIGHT  03/01/2019   IR US  GUIDE VASC ACCESS RIGHT  06/27/2021   IRRIGATION AND DEBRIDEMENT ELBOW Left 06/24/2021   Procedure: IRRIGATION AND DEBRIDEMENT ELBOW;  Surgeon: Leora Lynwood SAUNDERS, MD;  Location: [facility] ORS;  Service: Orthopedics;   Laterality: Left;   KIDNEY TRANSPLANT  03/27/2015   Penile Pump Insertion     PERIPHERAL VASCULAR ATHERECTOMY  09/11/2021   Procedure: PERIPHERAL VASCULAR ATHERECTOMY;  Surgeon: Darron Deatrice LABOR, MD;  Location: [facility];  Service: Cardiovascular;;  Shockwave Lithotripsy   TEE WITHOUT CARDIOVERSION N/A 01/17/2020   Procedure: TRANSESOPHAGEAL ECHOCARDIOGRAM (TEE) WITH PROPOFOL ;  Surgeon: Alvan Dorn FALCON, MD;  Location: [facility];  Service: Endoscopy;  Laterality: N/A;   WOUND DEBRIDEMENT Right 03/06/2021   Procedure: DEBRIDEMENT WOUND;  Surgeon: Gershon Donnice SAUNDERS, DPM;  Location: [facility];  Service: Podiatry;  Laterality: Right;    Social History:  reports that he has never smoked. He has never used smokeless tobacco. He reports that he does not drink alcohol and does not use drugs.   Allergies  Allergen Reactions   Bactrim  [Sulfamethoxazole -Trimethoprim ]     Runs pt's sugar and phosphorus   Doxycycline  Other (See Comments)    Runs up pt's blood sugar and phosphorus   Flagyl  [Metronidazole ] Rash    Patient having hives to either the vancomycin  or flagyl  (both infusing together).   Vancomycin  Rash    Patient having hives to either vancomycin  or flagyl     Family History  Problem Relation Age of Onset   Diabetes Mother    Heart disease Father    Colon cancer Neg Hx      Prior to Admission medications   Medication Sig Start Date End Date Taking? Authorizing Provider  atorvastatin  (LIPITOR) 10 MG tablet Take 1 tablet (10 mg total) by mouth daily. 05/18/23   Court Dorn PARAS, MD  Azathioprine  75 MG TABS Take 2 tablets by mouth daily.    [provider]  blood glucose meter kit and supplies KIT Dispense based on patient and insurance preference. Use up to four times daily as directed. (FOR ICD-9 250.00, 250.01). 04/11/20   [name], Gebreselassie W, MD  Blood Glucose Monitoring Suppl (ACCU-CHEK GUIDE ME) w/Device KIT 1 Piece by Does not apply route as directed.  02/18/18   [name], Gebreselassie W, MD  brimonidine  (ALPHAGAN ) 0.2 % ophthalmic solution Place 1 drop into both eyes 3 (three) times daily. 07/25/21   [provider]  cloNIDine  (CATAPRES  - DOSED IN MG/24 HR) 0.3 mg/24hr patch Place 1 patch (0.3 mg total) onto the skin once a week. Patient taking differently: Place 0.3 mg onto the skin once a week. Place patch on Sunday 08/21/21   Court Dorn PARAS, MD  Continuous Blood Gluc Receiver (DEXCOM G7 RECEIVER) DEVI Use to check blood sugar 3 times a day. Dx code e11.65 12/16/21   Georgina Speaks, FNP  Continuous Glucose Sensor (DEXCOM G7 SENSOR) MISC USE TO CHECK GLUCOSE THREE TIMES DAILY AS DIRECTED, CHANGE SENSOR EVERY 10 DAYS. 02/03/23   Thapa, Sudan, MD  dorzolamide -timolol  (COSOPT ) 22.3-6.8 MG/ML ophthalmic solution Place 1 drop into both eyes 2 (two) times daily. 07/25/21   [provider]  Dulaglutide  (TRULICITY ) 3 MG/0.5ML SOPN Inject 3 mg as directed once a week. 01/27/23   Thapa, Sudan, MD  furosemide  (LASIX ) 20 MG tablet 20 mg 2 (  two) times daily. 04/23/22   [provider]  gabapentin  (NEURONTIN ) 300 MG capsule Take 3 capsules (900 mg total) by mouth 3 (three) times daily. 3 times a day 11/22/21   Valdemar Longs R, NP  glucose blood (ACCU-CHEK GUIDE) test strip Use as instructed 4 x daily. E11.65 02/19/18   Lenis Ethelle ORN, MD  hydrALAZINE  (APRESOLINE ) 25 MG tablet Take 1 tablet (25 mg total) by mouth 3 (three) times daily. 02/05/22   Moore, Janece, FNP  HYDROcodone -acetaminophen  (NORCO/VICODIN) 5-325 MG tablet Take 1 tablet by mouth every 6 (six) hours as needed. 05/03/23   Schutt, Marsa HERO, PA-C  latanoprost  (XALATAN ) 0.005 % ophthalmic solution Place 1 drop into both eyes at bedtime. 07/25/21   [provider]  lidocaine  (LIDODERM ) 5 % Place 1 patch onto the skin daily. Remove & Discard patch within 12 hours or as directed by MD 05/03/23   Schutt, Marsa HERO, PA-C  Multiple Vitamin (MULTIVITAMIN) capsule Take 1 capsule  by mouth daily.    [provider]  mupirocin  ointment (BACTROBAN ) 2 % Apply 1 application. topically 2 (two) times daily. For wound care 09/12/21   Stover, Titorya, DPM  mycophenolate  (MYFORTIC ) 360 MG TBEC EC tablet Take 720 mg by mouth 2 (two) times daily. 03/19/23   [provider]  NOVOLIN  N FLEXPEN 100 UNIT/ML FlexPen INJECT 30 UNITS SUBCUTANEOUSLY IN THE MORNING AND 20 UNITS with SUPPER. 01/27/23   Thapa, Sudan, MD  NOVOLOG  FLEXPEN 100 UNIT/ML FlexPen INJECT 8 UNITS SUBCUTANEOUSLY with Dinner. 01/27/23   Thapa, Sudan, MD  nystatin  (MYCOSTATIN ) 100000 UNIT/ML suspension Take 5 mLs by mouth 4 (four) times daily. 03/19/23   [provider]  ondansetron  (ZOFRAN ) 4 MG tablet Take 1 tablet (4 mg total) by mouth every 6 (six) hours as needed for nausea. 11/19/21   Pokhrel, Laxman, MD  sodium bicarbonate  650 MG tablet Take 1,300 mg by mouth 3 (three) times daily. 11/02/18   [provider]  tacrolimus  (PROGRAF ) 1 MG capsule Take 4 capsules (4 mg total) by mouth 2 (two) times daily. 08/30/21   Rai, Ripudeep K, MD  Vitamin D , Ergocalciferol , (DRISDOL ) 50000 units CAPS capsule Take 50,000 Units by mouth See admin instructions. 15th of the month    [provider]    Physical Exam: BP (!) 161/92   Pulse 72   Temp 98.9 F (37.2 C)   Resp 17   Ht 5' 9 (1.753 m)   Wt 97.5 kg   SpO2 90%   BMI 31.74 kg/m   General: 63 y.o. year-old male well developed well nourished in no acute distress.  Alert and oriented x3. HEENT: NCAT, EOMI Neck: Supple, trachea medial Cardiovascular: Regular rate and rhythm with no rubs or gallops.  No thyromegaly or JVD noted.  No lower extremity edema. 2/4 pulses in all 4 extremities. Respiratory: Clear to auscultation with no wheezes or rales. Good inspiratory effort. Abdomen: Soft, nontender nondistended with normal bowel sounds x4 quadrants. Muskuloskeletal: Right BKA.  No cyanosis, clubbing or edema noted bilaterally Neuro: CN  II-XII intact, strength 5/5 x 4, sensation, reflexes intact Skin: No ulcerative lesions noted or rashes Psychiatry: Judgement and insight appear normal. Mood is appropriate for condition and setting          Labs on Admission:  Basic Metabolic Panel: Recent Labs  Lab 06/07/23 2002  NA 136  K 4.0  CL 101  CO2 26  GLUCOSE 93  BUN 40*  CREATININE 3.51*  CALCIUM  8.9   Liver  Function Tests: No results for input(s): AST, ALT, ALKPHOS, BILITOT, PROT, ALBUMIN  in the last 168 hours. No results for input(s): LIPASE, AMYLASE in the last 168 hours. No results for input(s): AMMONIA in the last 168 hours. CBC: Recent Labs  Lab 06/07/23 2002  WBC 6.0  HGB 9.1*  HCT 32.8*  MCV 83.0  PLT 262   Cardiac Enzymes: No results for input(s): CKTOTAL, CKMB, CKMBINDEX, TROPONINI in the last 168 hours.  BNP (last 3 results) Recent Labs    06/18/22 1007  BNP 10.3    ProBNP (last 3 results) No results for input(s): PROBNP in the last 8760 hours.  CBG: No results for input(s): GLUCAP in the last 168 hours.  Radiological Exams on Admission: DG Knee Complete 4 Views Right Result Date: 06/07/2023 CLINICAL DATA:  Right knee pain EXAM: RIGHT KNEE - COMPLETE 4+ VIEW COMPARISON:  None Available. FINDINGS: Status post below knee amputation. No fracture or dislocation is seen. Mild tricompartmental degenerative changes, most prominent at the patellofemoral compartment. Moderate suprapatellar knee joint effusion. Vascular calcifications. IMPRESSION: Status post below knee amputation. No fracture or dislocation is seen. Electronically Signed   By: Pinkie Pebbles M.D.   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 in anticipation for surgical procedure in the morning Orthopedic surgeon (Dr. Onesimo) was consulted and will follow-up with patient in the morning per ED PA  History of renal transplant/immunosuppression Resume home regular anti-rejection medications   Type 1 DM with vascular, retinal and neurological complications Continue ISS and hypoglycemia protocol  ESRD on HD (TTS) Nephrology will be consulted for maintenance dialysis  Essential hypertension Continue hydralazine , Catapres   DVT prophylaxis: SCDs  Family Communication: Family at bedside (all questions answered to satisfaction)   Advance Care Planning:   Code Status: Full Code   Consults: Orthopedic surgery   Severity of Illness: The appropriate patient status for this patient is INPATIENT. Inpatient status is judged to be reasonable and necessary in order to provide the required intensity of service to ensure the patient's safety. The patient's presenting symptoms, physical exam findings, and initial radiographic and laboratory data in the context of their chronic comorbidities is felt to place them at high risk for further clinical deterioration. Furthermore, it is not anticipated that the patient will be medically stable for discharge from the hospital within 2 midnights of admission.   * I certify that at the point of admission it is my clinical judgment that the patient will require inpatient hospital care spanning beyond 2 midnights from the point of admission due to high intensity of service, high risk for further deterioration and high frequency of surveillance required.*  Author: Eldridge Marcott,  DO 06/08/2023 3:40 AM  For on call review [email or web address].

## 2023-06-07 NOTE — ED Triage Notes (Signed)
 Pt c/o rt knee pain that has been going on for about a month. Pt states today it is worse and states swelling. No swelling, or redness noted to the site.

## 2023-06-07 NOTE — ED Provider Notes (Signed)
 [facility] AT [facility] Provider Note   CSN: [id number] Arrival date & time: 06/07/23  1722     History  Chief Complaint  Patient presents with  . Knee Pain    Raymond [name] is a 63 y.o. male.  The history is provided by the patient and the spouse.  Knee Pain Location:  Knee Time since incident:  1 month Injury: no   Knee location:  R knee Pain details:    Quality:  Aching, pressure and throbbing   Radiates to:  Does not radiate   Severity:  Severe   Onset quality:  Gradual   Duration:  1 month   Timing:  Constant   Progression:  Worsening Chronicity:  New Prior injury to area:  No Relieved by:  Nothing Worsened by:  Bearing weight, flexion and extension Ineffective treatments:  None tried Associated symptoms: decreased ROM and stiffness        Home Medications Prior to Admission medications   Medication Sig Start Date End Date Taking? Authorizing Provider  atorvastatin  (LIPITOR) 10 MG tablet Take 1 tablet (10 mg total) by mouth daily. 05/18/23   Court Dorn PARAS, MD  Azathioprine  75 MG TABS Take 2 tablets by mouth daily.    [provider]  blood glucose meter kit and supplies KIT Dispense based on patient and insurance preference. Use up to four times daily as directed. (FOR ICD-9 250.00, 250.01). 04/11/20   [name], Gebreselassie W, MD  Blood Glucose Monitoring Suppl (ACCU-CHEK GUIDE ME) w/Device KIT 1 Piece by Does not apply route as directed. 02/18/18   [name], Gebreselassie W, MD  brimonidine  (ALPHAGAN ) 0.2 % ophthalmic solution Place 1 drop into both eyes 3 (three) times daily. 07/25/21   [provider]  cloNIDine  (CATAPRES  - DOSED IN MG/24 HR) 0.3 mg/24hr patch Place 1 patch (0.3 mg total) onto the skin once a week. Patient taking differently: Place 0.3 mg onto the skin once a week. Place patch on Sunday 08/21/21   Court Dorn PARAS, MD  Continuous Blood Gluc Receiver (DEXCOM G7 RECEIVER) DEVI Use to  check blood sugar 3 times a day. Dx code e11.65 12/16/21   Georgina Speaks, FNP  Continuous Glucose Sensor (DEXCOM G7 SENSOR) MISC USE TO CHECK GLUCOSE THREE TIMES DAILY AS DIRECTED, CHANGE SENSOR EVERY 10 DAYS. 02/03/23   Thapa, Sudan, MD  dorzolamide -timolol  (COSOPT ) 22.3-6.8 MG/ML ophthalmic solution Place 1 drop into both eyes 2 (two) times daily. 07/25/21   [provider]  Dulaglutide  (TRULICITY ) 3 MG/0.5ML SOPN Inject 3 mg as directed once a week. 01/27/23   Thapa, Sudan, MD  furosemide  (LASIX ) 20 MG tablet 20 mg 2 (two) times daily. 04/23/22   [provider]  gabapentin  (NEURONTIN ) 300 MG capsule Take 3 capsules (900 mg total) by mouth 3 (three) times daily. 3 times a day 11/22/21   Valdemar Longs R, NP  glucose blood (ACCU-CHEK GUIDE) test strip Use as instructed 4 x daily. E11.65 02/19/18   Lenis Ethelle ORN, MD  hydrALAZINE  (APRESOLINE ) 25 MG tablet Take 1 tablet (25 mg total) by mouth 3 (three) times daily. 02/05/22   Moore, Janece, FNP  HYDROcodone -acetaminophen  (NORCO/VICODIN) 5-325 MG tablet Take 1 tablet by mouth every 6 (six) hours as needed. 05/03/23   Schutt, Marsa HERO, PA-C  latanoprost  (XALATAN ) 0.005 % ophthalmic solution Place 1 drop into both eyes at bedtime. 07/25/21   [provider]  lidocaine  (LIDODERM ) 5 % Place 1 patch onto the skin daily. Remove &  Discard patch within 12 hours or as directed by MD 05/03/23   Schutt, Marsa HERO, PA-C  Multiple Vitamin (MULTIVITAMIN) capsule Take 1 capsule by mouth daily.    [provider]  mupirocin  ointment (BACTROBAN ) 2 % Apply 1 application. topically 2 (two) times daily. For wound care 09/12/21   Stover, Titorya, DPM  mycophenolate  (MYFORTIC ) 360 MG TBEC EC tablet Take 720 mg by mouth 2 (two) times daily. 03/19/23   [provider]  NOVOLIN  N FLEXPEN 100 UNIT/ML FlexPen INJECT 30 UNITS SUBCUTANEOUSLY IN THE MORNING AND 20 UNITS with SUPPER. 01/27/23   Thapa, Sudan, MD  NOVOLOG  FLEXPEN 100 UNIT/ML  FlexPen INJECT 8 UNITS SUBCUTANEOUSLY with Dinner. 01/27/23   Thapa, Sudan, MD  nystatin  (MYCOSTATIN ) 100000 UNIT/ML suspension Take 5 mLs by mouth 4 (four) times daily. 03/19/23   [provider]  ondansetron  (ZOFRAN ) 4 MG tablet Take 1 tablet (4 mg total) by mouth every 6 (six) hours as needed for nausea. 11/19/21   Pokhrel, Laxman, MD  sodium bicarbonate  650 MG tablet Take 1,300 mg by mouth 3 (three) times daily. 11/02/18   [provider]  tacrolimus  (PROGRAF ) 1 MG capsule Take 4 capsules (4 mg total) by mouth 2 (two) times daily. 08/30/21   Rai, Ripudeep MARLA, MD  Vitamin D , Ergocalciferol , (DRISDOL ) 50000 units CAPS capsule Take 50,000 Units by mouth See admin instructions. 15th of the month    [provider]      Allergies    Bactrim  [sulfamethoxazole -trimethoprim ], Doxycycline , Flagyl  [metronidazole ], and Vancomycin     Review of Systems   Review of Systems  Musculoskeletal:  Positive for stiffness.    Physical Exam Updated Vital Signs BP 111/89 (BP Location: Right Arm)   Pulse 76   Temp 99 F (37.2 C) (Oral)   Resp 16   Ht 5' 9 (1.753 m)   Wt 97.5 kg   SpO2 96%   BMI 31.74 kg/m  Physical Exam  ED Results / Procedures / Treatments   Labs (all labs ordered are listed, but only abnormal results are displayed) Labs Reviewed  CBC - Abnormal; Notable for the following components:      Result Value   RBC 3.95 (*)    Hemoglobin 9.1 (*)    HCT 32.8 (*)    MCH 23.0 (*)    MCHC 27.7 (*)    RDW 17.1 (*)    All other components within normal limits  BASIC METABOLIC PANEL - Abnormal; Notable for the following components:   BUN 40 (*)    Creatinine, Ser 3.51 (*)    GFR, Estimated 19 (*)    All other components within normal limits  BODY FLUID CULTURE W GRAM STAIN  GRAM STAIN  SEDIMENTATION RATE  C-REACTIVE PROTEIN  SYNOVIAL CELL COUNT + DIFF, W/ CRYSTALS  GLUCOSE, BODY FLUID OTHER            CBG MONITORING, ED    EKG None  Radiology DG Knee  Complete 4 Views Right Result Date: 06/07/2023 CLINICAL DATA:  Right knee pain EXAM: RIGHT KNEE - COMPLETE 4+ VIEW COMPARISON:  None Available. FINDINGS: Status post below knee amputation. No fracture or dislocation is seen. Mild tricompartmental degenerative changes, most prominent at the patellofemoral compartment. Moderate suprapatellar knee joint effusion. Vascular calcifications. IMPRESSION: Status post below knee amputation. No fracture or dislocation is seen. Electronically Signed   By: Pinkie Pebbles M.D.   On: 06/07/2023 19:20    Procedures .Joint Aspiration/Arthrocentesis  Date/Time: 06/07/2023 9:17 PM  Performed  by: Arloa Chroman, PA-C Authorized by: Arloa Chroman, PA-C   Consent:    Consent obtained:  Verbal   Consent given by:  Patient   Risks discussed:  Bleeding, incomplete drainage, infection, nerve damage and pain Universal protocol:    Patient identity confirmed:  Arm band Location:    Location:  Knee   Knee:  R knee Anesthesia:    Anesthesia method:  Local infiltration   Local anesthetic:  Lidocaine  1% w/o epi Procedure details:    Preparation: Patient was prepped and draped in usual sterile fashion     Needle gauge:  18 G   Approach:  Superior   Aspirate amount:  70   Aspirate characteristics:  Cloudy and yellow   Steroid injected: no     Specimen collected: yes   Post-procedure details:    Dressing:  Adhesive bandage   Procedure completion:  Tolerated well, no immediate complications .Critical Care  Performed by: Arloa Chroman, PA-C Authorized by: Arloa Chroman, PA-C   Critical care provider statement:    Critical care time (minutes):  30   Critical care time was exclusive of:  Separately billable procedures and treating other patients   Critical care was necessary to treat or prevent imminent or life-threatening deterioration of the following conditions: limb threatening illness- septic arthritis.   Critical care was time spent personally by  me on the following activities:  Development of treatment plan with patient or surrogate, discussions with consultants, evaluation of patient's response to treatment, examination of patient, ordering and review of laboratory studies, ordering and review of radiographic studies, ordering and performing treatments and interventions, pulse oximetry, re-evaluation of patient's condition, review of old charts and obtaining history from patient or surrogate     Medications Ordered in ED Medications  lidocaine  (PF) (XYLOCAINE ) 1 % injection 30 mL (has no administration in time range)  oxyCODONE -acetaminophen  (PERCOCET/ROXICET) 5-325 MG per tablet 2 tablet (2 tablets Oral Given 06/07/23 1934)  ondansetron  (ZOFRAN -ODT) disintegrating tablet 4 mg (4 mg Oral Given 06/07/23 1934)    ED Course/ Medical Decision Making/ A&P Clinical Course as of 06/07/23 2132  Sun Jun 07, 2023  2132 Sed Rate(!): 120 [AH]    Clinical Course User Index [AH] Arloa Chroman, PA-C                                 Medical Decision Making This patient presents to the ED with chief complaint(s) of knee pain and swelling with pertinent past medical history of ESRD, s/p R BKA which further complicates the presenting complaint. The complaint involves an extensive differential diagnosis and treatment options and also carries with it a high risk of complications and morbidity.    The differential diagnosis includes OA, Joint effusion, crystallopathy,  septic joint, hemearthorsis, bursitis,   The initial plan is to order labs, imaging, synovial analysis and to treat patient with pain medication for pain   Additional Tests and treatment considered: Patient is low risk / negative by dvt, therefore do not feel that dvt study is indicated.  Additional history obtained: Additional history obtained from family Records reviewed Care Everywhere/External Records  Reassessment and review (also see workup area): Lab Tests: I Ordered,  and personally interpreted labs.  The pertinent results include:   No lsukocytosis Bun/cr at baseline120  HGB at baseline Sed Rate  Synovial fluid with >800000 whites and >96%  Neutrophils  Imaging Studies: I ordered and independently visualized and  interpreted the following imaging X-ray  of the R knee which showed effusion The interpretation of the imaging was limited to assessing for emergent pathology, for which purpose it was ordered.  Consultations Obtained: I requested consultation with the consultant Dr Onesimo, and discussed  findings as well as pertinent plan - they recommend: Medical Admission for septic arthritis and treatment with Vanc  Medicines ordered and prescription drug management: I ordered the following medications percocet, Vancomycin  for knee infection  I considered this additional medications: N/A   Reevaluation of the patient after these medicines showed that the patient    improved      Complexity of problems addressed: Patient's presentation is most consistent with  acute presentation with potential threat to life or bodily function     During patient's assessment     Disposition: After consideration of the diagnostic results and the patient's response to treatment,  I feel that the patent would benefit from admission for septic arthritis.    Amount and/or Complexity of Data Reviewed Independent Historian: spouse Labs: ordered. Decision-making details documented in ED Course. Radiology: ordered and independent interpretation performed.  Risk Prescription drug management. Decision regarding hospitalization. Emergency major surgery.            Final Clinical Impression(s) / ED Diagnoses Final diagnoses:  None    Rx / DC Orders ED Discharge Orders     None         Arloa Chroman, PA-C 06/08/23 1628    Cleotilde Rogue, MD 06/09/23 2001

## 2023-06-08 ENCOUNTER — Encounter (HOSPITAL_COMMUNITY)
Admission: EM | Disposition: A | Discharge status: Home Health | Payer: Self-pay | Source: Home / Self Care | Attending: Internal Medicine

## 2023-06-08 ENCOUNTER — Inpatient Hospital Stay (HOSPITAL_COMMUNITY): Payer: BLUE CROSS/BLUE SHIELD | Admitting: Certified Registered"

## 2023-06-08 ENCOUNTER — Encounter (HOSPITAL_COMMUNITY): Payer: Self-pay | Admitting: Internal Medicine

## 2023-06-08 DIAGNOSIS — N186 End stage renal disease: Secondary | ICD-10-CM

## 2023-06-08 DIAGNOSIS — I10 Essential (primary) hypertension: Secondary | ICD-10-CM | POA: Insufficient documentation

## 2023-06-08 DIAGNOSIS — E1059 Type 1 diabetes mellitus with other circulatory complications: Secondary | ICD-10-CM | POA: Insufficient documentation

## 2023-06-08 HISTORY — PX: KNEE ARTHROTOMY: SHX5881

## 2023-06-08 LAB — COMPREHENSIVE METABOLIC PANEL
ALT: 14 U/L (ref 0–44)
AST: 11 U/L — ABNORMAL LOW (ref 15–41)
Albumin: 2.2 g/dL — ABNORMAL LOW (ref 3.5–5.0)
Alkaline Phosphatase: 74 U/L (ref 38–126)
Anion gap: 10 (ref 5–15)
BUN: 43 mg/dL — ABNORMAL HIGH (ref 8–23)
CO2: 26 mmol/L (ref 22–32)
Calcium: 8.8 mg/dL — ABNORMAL LOW (ref 8.9–10.3)
Chloride: 99 mmol/L (ref 98–111)
Creatinine, Ser: 4.06 mg/dL — ABNORMAL HIGH (ref 0.61–1.24)
GFR, Estimated: 16 mL/min — ABNORMAL LOW (ref >60)
Glucose, Bld: 133 mg/dL — ABNORMAL HIGH (ref 70–99)
Potassium: 4 mmol/L (ref 3.5–5.1)
Sodium: 135 mmol/L (ref 135–145)
Total Bilirubin: 0.4 mg/dL (ref 0.0–1.2)
Total Protein: 7.8 g/dL (ref 6.5–8.1)

## 2023-06-08 LAB — GLUCOSE, CAPILLARY
Glucose-Capillary: 118 mg/dL — ABNORMAL HIGH (ref 70–99)
Glucose-Capillary: 118 mg/dL — ABNORMAL HIGH (ref 70–99)
Glucose-Capillary: 148 mg/dL — ABNORMAL HIGH (ref 70–99)

## 2023-06-08 LAB — SYNOVIAL CELL COUNT + DIFF, W/ CRYSTALS
Crystals, Fluid: NONE SEEN
Eosinophils-Synovial: 0 % (ref 0–1)
Lymphocytes-Synovial Fld: 3 % (ref 0–20)
Monocyte-Macrophage-Synovial Fluid: 1 % — ABNORMAL LOW (ref 50–90)
Neutrophil, Synovial: 96 % — ABNORMAL HIGH (ref 0–25)
WBC, Synovial: 12285 /mm3 — ABNORMAL HIGH (ref 0–200)

## 2023-06-08 LAB — PHOSPHORUS: Phosphorus: 5.7 mg/dL — ABNORMAL HIGH (ref 2.5–4.6)

## 2023-06-08 LAB — CBG MONITORING, ED
Glucose-Capillary: 125 mg/dL — ABNORMAL HIGH (ref 70–99)
Glucose-Capillary: 112 mg/dL — ABNORMAL HIGH (ref 70–99)
Glucose-Capillary: 114 mg/dL — ABNORMAL HIGH (ref 70–99)

## 2023-06-08 LAB — CBC
HCT: 31.1 % — ABNORMAL LOW (ref 39.0–52.0)
Hemoglobin: 8.5 g/dL — ABNORMAL LOW (ref 13.0–17.0)
MCH: 22.8 pg — ABNORMAL LOW (ref 26.0–34.0)
MCHC: 27.3 g/dL — ABNORMAL LOW (ref 30.0–36.0)
MCV: 83.6 fL (ref 80.0–100.0)
Platelets: 270 10*3/uL (ref 150–400)
RBC: 3.72 MIL/uL — ABNORMAL LOW (ref 4.22–5.81)
RDW: 17.3 % — ABNORMAL HIGH (ref 11.5–15.5)
WBC: 5.6 10*3/uL (ref 4.0–10.5)
nRBC: 0 % (ref 0.0–0.2)

## 2023-06-08 LAB — BODY FLUID CELL COUNT WITH DIFFERENTIAL: Total Nucleated Cell Count, Fluid: 12285 uL — ABNORMAL HIGH (ref 0–1000)

## 2023-06-08 LAB — C-REACTIVE PROTEIN: CRP: 22.8 mg/dL — ABNORMAL HIGH (ref <1.0)

## 2023-06-08 LAB — HIV ANTIBODY (ROUTINE TESTING W REFLEX): HIV Screen 4th Generation wRfx: NONREACTIVE

## 2023-06-08 LAB — MAGNESIUM: Magnesium: 2.1 mg/dL (ref 1.7–2.4)

## 2023-06-08 SURGERY — ARTHROTOMY, KNEE
Anesthesia: General | Site: Knee | Laterality: Right

## 2023-06-08 MED ORDER — INSULIN NPH (HUMAN) (ISOPHANE) 100 UNIT/ML ~~LOC~~ SUSP
30.0000 [IU] | Freq: Every day | SUBCUTANEOUS | Status: DC
  Administered 2023-06-09: 30 [IU] via SUBCUTANEOUS

## 2023-06-08 MED ORDER — FENTANYL CITRATE (PF) 100 MCG/2ML IJ SOLN
INTRAMUSCULAR | Status: AC
  Filled 2023-06-08: qty 2

## 2023-06-08 MED ORDER — SUCCINYLCHOLINE CHLORIDE 200 MG/10ML IV SOSY
PREFILLED_SYRINGE | INTRAVENOUS | Status: AC
  Filled 2023-06-08: qty 10

## 2023-06-08 MED ORDER — OXYCODONE-ACETAMINOPHEN 5-325 MG PO TABS
1.0000 | ORAL_TABLET | ORAL | Status: DC | PRN
Start: 2023-06-08 — End: 2023-06-11
  Administered 2023-06-08: 1 via ORAL
  Administered 2023-06-09: 2 via ORAL
  Administered 2023-06-09: 1 via ORAL
  Administered 2023-06-09 – 2023-06-10 (×4): 2 via ORAL
  Filled 2023-06-08 (×7): qty 2

## 2023-06-08 MED ORDER — PROPOFOL 10 MG/ML IV BOLUS
INTRAVENOUS | Status: AC
  Filled 2023-06-08: qty 20

## 2023-06-08 MED ORDER — MIDAZOLAM HCL 2 MG/2ML IJ SOLN
INTRAMUSCULAR | Status: DC | PRN
  Administered 2023-06-08: 2 mg via INTRAVENOUS

## 2023-06-08 MED ORDER — BRIMONIDINE TARTRATE 0.2 % OP SOLN
1.0000 [drp] | Freq: Three times a day (TID) | OPHTHALMIC | Status: DC
  Administered 2023-06-09 – 2023-06-11 (×9): 1 [drp] via OPHTHALMIC
  Filled 2023-06-08: qty 5

## 2023-06-08 MED ORDER — INSULIN NPH (HUMAN) (ISOPHANE) 100 UNIT/ML ~~LOC~~ SUSP
30.0000 [IU] | Freq: Every day | SUBCUTANEOUS | Status: DC
Start: 2023-06-08 — End: 2023-06-09
  Administered 2023-06-08: 30 [IU] via SUBCUTANEOUS
  Filled 2023-06-08: qty 10

## 2023-06-08 MED ORDER — CLONIDINE HCL 0.3 MG/24HR TD PTWK
0.3000 mg | MEDICATED_PATCH | TRANSDERMAL | Status: DC
  Filled 2023-06-08: qty 1

## 2023-06-08 MED ORDER — INSULIN ASPART 100 UNIT/ML IJ SOLN
0.0000 [IU] | Freq: Three times a day (TID) | INTRAMUSCULAR | Status: DC
  Administered 2023-06-09: 5 [IU] via SUBCUTANEOUS
  Administered 2023-06-09 – 2023-06-10 (×2): 3 [IU] via SUBCUTANEOUS
  Administered 2023-06-10: 2 [IU] via SUBCUTANEOUS
  Administered 2023-06-11 (×2): 3 [IU] via SUBCUTANEOUS
  Administered 2023-06-12: 2 [IU] via SUBCUTANEOUS
  Administered 2023-06-12: 5 [IU] via SUBCUTANEOUS

## 2023-06-08 MED ORDER — FUROSEMIDE 20 MG PO TABS
20.0000 mg | ORAL_TABLET | Freq: Two times a day (BID) | ORAL | Status: DC
  Administered 2023-06-09 – 2023-06-11 (×4): 20 mg via ORAL
  Filled 2023-06-08 (×4): qty 1

## 2023-06-08 MED ORDER — HYDROMORPHONE HCL 1 MG/ML IJ SOLN
0.5000 mg | INTRAMUSCULAR | Status: DC | PRN
  Administered 2023-06-08 – 2023-06-10 (×13): 0.5 mg via INTRAVENOUS
  Filled 2023-06-08 (×13): qty 0.5

## 2023-06-08 MED ORDER — ONDANSETRON HCL 4 MG/2ML IJ SOLN
4.0000 mg | Freq: Four times a day (QID) | INTRAMUSCULAR | Status: DC | PRN
  Administered 2023-06-08 (×2): 4 mg via INTRAVENOUS
  Filled 2023-06-08: qty 2

## 2023-06-08 MED ORDER — OXYCODONE HCL 5 MG PO TABS: 5.0000 mg | ORAL_TABLET | Freq: Once | ORAL | Status: DC | PRN

## 2023-06-08 MED ORDER — DEXAMETHASONE SODIUM PHOSPHATE 10 MG/ML IJ SOLN
INTRAMUSCULAR | Status: DC | PRN
  Administered 2023-06-08: 5 mg via INTRAVENOUS

## 2023-06-08 MED ORDER — LIDOCAINE HCL (CARDIAC) PF 100 MG/5ML IV SOSY
PREFILLED_SYRINGE | INTRAVENOUS | Status: DC | PRN
  Administered 2023-06-08: 100 mg via INTRATRACHEAL

## 2023-06-08 MED ORDER — PHENYLEPHRINE 80 MCG/ML (10ML) SYRINGE FOR IV PUSH (FOR BLOOD PRESSURE SUPPORT)
PREFILLED_SYRINGE | INTRAVENOUS | Status: AC
Start: 2023-06-08 — End: ?
  Filled 2023-06-08: qty 10

## 2023-06-08 MED ORDER — BUPIVACAINE-EPINEPHRINE (PF) 0.5% -1:200000 IJ SOLN
INTRAMUSCULAR | Status: DC | PRN
  Administered 2023-06-08: 30 mL via PERINEURAL

## 2023-06-08 MED ORDER — ROCURONIUM BROMIDE 100 MG/10ML IV SOLN
INTRAVENOUS | Status: DC | PRN
  Administered 2023-06-08: 5 mg via INTRAVENOUS

## 2023-06-08 MED ORDER — AZATHIOPRINE 50 MG PO TABS
150.0000 mg | ORAL_TABLET | Freq: Every day | ORAL | Status: DC
Start: 2023-06-08 — End: 2023-06-10
  Administered 2023-06-09 – 2023-06-10 (×2): 150 mg via ORAL
  Filled 2023-06-08 (×5): qty 3

## 2023-06-08 MED ORDER — ROCURONIUM BROMIDE 10 MG/ML (PF) SYRINGE
PREFILLED_SYRINGE | INTRAVENOUS | Status: AC
  Filled 2023-06-08: qty 10

## 2023-06-08 MED ORDER — VANCOMYCIN HCL IN DEXTROSE 1-5 GM/200ML-% IV SOLN
1000.0000 mg | INTRAVENOUS | Status: DC
  Administered 2023-06-09: 1000 mg via INTRAVENOUS
  Filled 2023-06-08: qty 200

## 2023-06-08 MED ORDER — HYDRALAZINE HCL 25 MG PO TABS
25.0000 mg | ORAL_TABLET | Freq: Three times a day (TID) | ORAL | Status: DC
  Administered 2023-06-08 – 2023-06-11 (×8): 25 mg via ORAL
  Filled 2023-06-08 (×10): qty 1

## 2023-06-08 MED ORDER — DORZOLAMIDE HCL-TIMOLOL MAL 2-0.5 % OP SOLN
1.0000 [drp] | Freq: Two times a day (BID) | OPHTHALMIC | Status: DC
  Administered 2023-06-09 – 2023-06-11 (×6): 1 [drp] via OPHTHALMIC
  Filled 2023-06-08: qty 10

## 2023-06-08 MED ORDER — BUPIVACAINE-EPINEPHRINE (PF) 0.5% -1:200000 IJ SOLN
INTRAMUSCULAR | Status: AC
  Filled 2023-06-08: qty 30

## 2023-06-08 MED ORDER — EPHEDRINE 5 MG/ML INJ
INTRAVENOUS | Status: AC
  Filled 2023-06-08: qty 5

## 2023-06-08 MED ORDER — TACROLIMUS 1 MG PO CAPS
4.0000 mg | ORAL_CAPSULE | Freq: Two times a day (BID) | ORAL | Status: DC
  Administered 2023-06-09 – 2023-06-10 (×3): 4 mg via ORAL
  Filled 2023-06-08 (×8): qty 4

## 2023-06-08 MED ORDER — INSULIN ASPART 100 UNIT/ML IJ SOLN: 0.0000 [IU] | INTRAMUSCULAR | Status: DC

## 2023-06-08 MED ORDER — NYSTATIN 100000 UNIT/ML MT SUSP: 5.0000 mL | Freq: Four times a day (QID) | OROMUCOSAL | Status: DC

## 2023-06-08 MED ORDER — ACETAMINOPHEN 650 MG RE SUPP: 650.0000 mg | Freq: Four times a day (QID) | RECTAL | Status: DC | PRN

## 2023-06-08 MED ORDER — ATORVASTATIN CALCIUM 10 MG PO TABS
10.0000 mg | ORAL_TABLET | Freq: Every day | ORAL | Status: DC
  Administered 2023-06-09 – 2023-06-12 (×4): 10 mg via ORAL
  Filled 2023-06-08 (×4): qty 1

## 2023-06-08 MED ORDER — INSULIN PUMP
Freq: Three times a day (TID) | SUBCUTANEOUS | Status: DC
  Filled 2023-06-08: qty 1

## 2023-06-08 MED ORDER — VANCOMYCIN VARIABLE DOSE PER UNSTABLE RENAL FUNCTION (PHARMACIST DOSING): Status: DC

## 2023-06-08 MED ORDER — SODIUM BICARBONATE 650 MG PO TABS
1300.0000 mg | ORAL_TABLET | Freq: Three times a day (TID) | ORAL | Status: DC
  Administered 2023-06-08 – 2023-06-10 (×3): 1300 mg via ORAL
  Filled 2023-06-08 (×5): qty 2

## 2023-06-08 MED ORDER — INSULIN ISOPHANE HUMAN 100 UNIT/ML KWIKPEN: 30.0000 [IU] | PEN_INJECTOR | Freq: Two times a day (BID) | SUBCUTANEOUS | Status: DC

## 2023-06-08 MED ORDER — GABAPENTIN 300 MG PO CAPS
900.0000 mg | ORAL_CAPSULE | Freq: Three times a day (TID) | ORAL | Status: DC
Start: 2023-06-08 — End: 2023-06-10
  Administered 2023-06-08 – 2023-06-09 (×4): 900 mg via ORAL
  Filled 2023-06-08 (×5): qty 3

## 2023-06-08 MED ORDER — LIDOCAINE HCL (PF) 2 % IJ SOLN
INTRAMUSCULAR | Status: AC
Start: 2023-06-08 — End: ?
  Filled 2023-06-08: qty 5

## 2023-06-08 MED ORDER — ACETAMINOPHEN 325 MG PO TABS
650.0000 mg | ORAL_TABLET | Freq: Four times a day (QID) | ORAL | Status: DC | PRN
  Administered 2023-06-11 – 2023-06-12 (×3): 650 mg via ORAL
  Filled 2023-06-08 (×3): qty 2

## 2023-06-08 MED ORDER — PROPOFOL 10 MG/ML IV BOLUS
INTRAVENOUS | Status: DC | PRN
  Administered 2023-06-08: 250 mg via INTRAVENOUS

## 2023-06-08 MED ORDER — MUPIROCIN 2 % EX OINT
1.0000 | TOPICAL_OINTMENT | Freq: Two times a day (BID) | CUTANEOUS | Status: DC
  Administered 2023-06-09 – 2023-06-11 (×6): 1 via TOPICAL
  Filled 2023-06-08 (×2): qty 22

## 2023-06-08 MED ORDER — PHENYLEPHRINE HCL (PRESSORS) 10 MG/ML IV SOLN
INTRAVENOUS | Status: DC | PRN
  Administered 2023-06-08: 100 ug via INTRAVENOUS

## 2023-06-08 MED ORDER — ONDANSETRON HCL 4 MG PO TABS: 4.0000 mg | ORAL_TABLET | Freq: Four times a day (QID) | ORAL | Status: DC | PRN

## 2023-06-08 MED ORDER — MIDAZOLAM HCL 2 MG/2ML IJ SOLN
INTRAMUSCULAR | Status: AC
  Filled 2023-06-08: qty 2

## 2023-06-08 MED ORDER — MYCOPHENOLATE SODIUM 180 MG PO TBEC
720.0000 mg | DELAYED_RELEASE_TABLET | Freq: Two times a day (BID) | ORAL | Status: DC
Start: 2023-06-08 — End: 2023-06-10
  Administered 2023-06-08 – 2023-06-10 (×4): 720 mg via ORAL
  Filled 2023-06-08 (×8): qty 4

## 2023-06-08 MED ORDER — LATANOPROST 0.005 % OP SOLN
1.0000 [drp] | Freq: Every day | OPHTHALMIC | Status: DC
  Administered 2023-06-08 – 2023-06-11 (×4): 1 [drp] via OPHTHALMIC
  Filled 2023-06-08: qty 2.5

## 2023-06-08 MED ORDER — HYDROMORPHONE HCL 1 MG/ML IJ SOLN: 0.2500 mg | INTRAMUSCULAR | Status: DC | PRN

## 2023-06-08 MED ORDER — SODIUM CHLORIDE 0.9 % IR SOLN
Status: DC | PRN
  Administered 2023-06-08: 3000 mL

## 2023-06-08 MED ORDER — OXYCODONE HCL 5 MG/5ML PO SOLN: 5.0000 mg | Freq: Once | ORAL | Status: DC | PRN

## 2023-06-08 MED ORDER — EPHEDRINE SULFATE (PRESSORS) 50 MG/ML IJ SOLN
INTRAMUSCULAR | Status: DC | PRN
  Administered 2023-06-08: 10 mg via INTRAVENOUS

## 2023-06-08 MED ORDER — SUCCINYLCHOLINE CHLORIDE 200 MG/10ML IV SOSY
PREFILLED_SYRINGE | INTRAVENOUS | Status: DC | PRN
  Administered 2023-06-08: 150 mg via INTRAVENOUS

## 2023-06-08 MED ORDER — ORAL CARE MOUTH RINSE: 15.0000 mL | Freq: Once | OROMUCOSAL | Status: AC

## 2023-06-08 MED ORDER — FENTANYL CITRATE (PF) 100 MCG/2ML IJ SOLN
INTRAMUSCULAR | Status: DC | PRN
Start: 2023-06-08 — End: 2023-06-08
  Administered 2023-06-08 (×2): 50 ug via INTRAVENOUS

## 2023-06-08 MED ORDER — CHLORHEXIDINE GLUCONATE 0.12 % MT SOLN
15.0000 mL | Freq: Once | OROMUCOSAL | Status: AC
  Administered 2023-06-08: 15 mL via OROMUCOSAL
  Filled 2023-06-08: qty 15

## 2023-06-08 MED ORDER — SODIUM CHLORIDE 0.9 % IV SOLN: INTRAVENOUS | Status: DC | PRN

## 2023-06-08 SURGICAL SUPPLY — 26 items
BNDG ELASTIC 3INX 5YD STR LF (GAUZE/BANDAGES/DRESSINGS) IMPLANT
BNDG STRETCH GAUZE 3IN X12FT (GAUZE/BANDAGES/DRESSINGS) IMPLANT
CLOTH BEACON ORANGE TIMEOUT ST (SAFETY) IMPLANT
COVER LIGHT HANDLE STERIS (MISCELLANEOUS) IMPLANT
CUFF TOURN SGL QUICK 18X4 (TOURNIQUET CUFF) IMPLANT
ELECT REM PT RETURN 9FT ADLT (ELECTROSURGICAL) ×1
ELECTRODE REM PT RTRN 9FT ADLT (ELECTROSURGICAL) IMPLANT
GAUZE SPONGE 4X4 12PLY STRL (GAUZE/BANDAGES/DRESSINGS) IMPLANT
GAUZE XEROFORM 1X8 LF (GAUZE/BANDAGES/DRESSINGS) IMPLANT
GLOVE BIO SURGEON STRL SZ8 (GLOVE) IMPLANT
GLOVE BIOGEL PI IND STRL 7.0 (GLOVE) IMPLANT
GLOVE BIOGEL PI IND STRL 8 (GLOVE) IMPLANT
GOWN STRL SURGICAL LRG XLNG (GOWNS) IMPLANT
IV NS IRRIG 3000ML ARTHROMATIC (IV SOLUTION) IMPLANT
KIT TURNOVER KIT A (KITS) IMPLANT
MANIFOLD NEPTUNE II (INSTRUMENTS) IMPLANT
NS IRRIG 1000ML POUR BTL (IV SOLUTION) IMPLANT
PACK BASIC LIMB (CUSTOM PROCEDURE TRAY) IMPLANT
PAD ABD 5X9 TENDERSORB (GAUZE/BANDAGES/DRESSINGS) IMPLANT
PAD ARMBOARD 7.5X6 YLW CONV (MISCELLANEOUS) IMPLANT
POSITIONER HEAD 8X9X4 ADT (SOFTGOODS) IMPLANT
SET BASIN LINEN APH (SET/KITS/TRAYS/PACK) IMPLANT
SUT ETHILON 3 0 PS 1 18 (SUTURE) IMPLANT
SWAB CULTURE ESWAB REG 1ML (MISCELLANEOUS) IMPLANT
SWAB CULTURE LIQ STUART DBL (MISCELLANEOUS) IMPLANT
SYR BULB IRRIG 60ML STRL (SYRINGE) IMPLANT

## 2023-06-08 NOTE — Transfer of Care (Signed)
 Immediate Anesthesia Transfer of Care Note  Patient: Raymond [name]  Procedure(s) Performed: KNEE ARTHROTOMY (Right: Knee)  Patient Location: PACU  Anesthesia Type:General  Level of Consciousness: awake  Airway & Oxygen Therapy: Patient Spontanous Breathing  Post-op Assessment: Post -op Vital signs reviewed and stable  Post vital signs: Reviewed  Last Vitals:  Vitals Value Taken Time  BP 165/82 06/08/23 1836  Temp    Pulse 94 06/08/23 1840  Resp 18 06/08/23 1840  SpO2 93 % 06/08/23 1840  Vitals shown include unfiled device data.  Last Pain:  Vitals:   06/08/23 1542  TempSrc:   PainSc: 9       Patients Stated Pain Goal: 8 (06/08/23 1542)  Complications: There were no known notable events for this encounter.

## 2023-06-08 NOTE — Inpatient Diabetes Management (Addendum)
 Inpatient Diabetes Program Recommendations  AACE/ADA: New Consensus Statement on Inpatient Glycemic Control   Target Ranges:  Prepandial:   less than 140 mg/dL      Peak postprandial:   less than 180 mg/dL (1-2 hours)      Critically ill patients:  140 - 180 mg/dL    Latest Reference Range & Units 06/08/23 04:29  Glucose-Capillary 70 - 99 mg/dL 874 (H)    Latest Reference Range & Units 06/07/23 20:02 06/08/23 05:05  Glucose 70 - 99 mg/dL 93 866 (H)   Review of Glycemic Control  Diabetes history: DM2 Outpatient Diabetes medications: NPH 30 units BID, Novolog  8 units with supper, Trulicity  3 mg Qweek (Sunday) Current orders for Inpatient glycemic control: Insulin  Pump AC&HS and 2am  Inpatient Diabetes Program Recommendations:    Insulin : Please discontinue orders for insulin  pump (as pt does not have an insulin  pump; he has Dexcom G7 CGM). Please order Novolog  0-6 units AC&HS and Semglee  10 units Q24H.  NOTE: Patient admitted with infectious arthritis of right knee. Per chart, patient has DM2 and sees Dr. Mercie [facility] End[facility]inology) and was last seen on 02/11/23. Noted telephone note on 03/19/23 by Dr. Mercie with insulin  adjustment instructions to increase Novolin  N 30 units with breakfast and 30 units with supper, Novolog  12 units TID with meals, plus 0-12 units TID for correction, and continue Trulicity  3 mg Qweek. Per Endocrinology note, patient is using Dexcom G7 (continuous glucose monitoring sensor). Patient is currently ordered insulin  pump but no notation of insulin  pump in the chart. Sent chat message to Warren Jubilee, RN at [facility] ED and she checked with patient and he does not use an insulin  pump; only has Dexcom G7 which monitors glucose.   Addendum 06/08/23@12 :8- Spoke with patient at bedside in ED about diabetes and home regimen for diabetes control. Patient reports being followed by Endocrinology for diabetes management and currently taking NPH 30 units BID, Novolog  8 units with  supper, and Trulicity  3 mg Qweek (Sunday).  Patient states he is using the Dexcom G7 for glucose monitoring and glucose has been trending much better over the past couple of weeks.  Patient states he is not having hypoglycemia very often since making the most recent insulin  changes. Patient confirms that his Dexcom G7 had expired. Family brought him a new Dexcom G7 sensor so he could resume. Informed patient that while inpatient, staff will be checking glucose via finger sticks and if he were to apply new Dexcom G7 sensor and need any radiology tests then the sensor may need to be removed. Encouraged patient to wait until he was discharged to apply a new Dexcom G7 sensor.  Patient states he has everything he needs at home for DM management.  Encouraged patient to continue to work with his Endocrinologist regarding DM management.  Patient verbalized understanding of information discussed and reports no further questions at this time related to diabetes.   Thanks, Earnie Gainer, RN, MSN, CDCES Diabetes Coordinator Inpatient Diabetes Program [phone] (Team Pager from 8am to 5pm)

## 2023-06-08 NOTE — Progress Notes (Signed)
   06/08/23 1625  TOC Brief Assessment  Insurance and Status Reviewed  Patient has primary care physician Yes  Home environment has been reviewed Single Family Home  Prior level of function: Independent  Prior/Current Home Services No current home services  Social Drivers of Health Review SDOH reviewed no interventions necessary  Readmission risk has been reviewed Yes  Transition of care needs transition of care needs identified, TOC will continue to follow     PT evaluation is pending at this time, TOC will continue to follow and assist with any future consults. Unable to assess or reach spouse.

## 2023-06-08 NOTE — Anesthesia Postprocedure Evaluation (Signed)
 Anesthesia Post Note  Patient: Raymond [name]  Procedure(s) Performed: KNEE ARTHROTOMY (Right: Knee)  Patient location during evaluation: PACU Anesthesia Type: General Level of consciousness: awake and alert Pain management: pain level controlled Vital Signs Assessment: post-procedure vital signs reviewed and stable Respiratory status: spontaneous breathing, nonlabored ventilation, respiratory function stable and patient connected to nasal cannula oxygen Cardiovascular status: blood pressure returned to baseline and stable Postop Assessment: no apparent nausea or vomiting Anesthetic complications: no   There were no known notable events for this encounter.   Last Vitals:  Vitals:   06/08/23 1835 06/08/23 1845  BP: (!) 165/82 (!) 185/97  Pulse: 99 98  Resp: (!) 21 15  Temp: 36.9 C   SpO2: 95% 95%    Last Pain:  Vitals:   06/08/23 1845  TempSrc:   PainSc: 0-No pain                 Gracelin Weisberg L Danaria Larsen

## 2023-06-08 NOTE — ED Notes (Signed)
 Patient aware to be NPO.

## 2023-06-08 NOTE — ED Notes (Signed)
 Asked patient about Dexcom, It only monitors levels. Asked patient if he could tell me what his level was at this time patient stated he need to change his dexcom.

## 2023-06-08 NOTE — Progress Notes (Signed)
 Pharmacy Antibiotic Note  Raymond [name] is a 63 y.o. male admitted on 06/07/2023 with concern for infectious arthritis of R knee. Of note, PMH significant for ESRD on HD TThS. Vancomycin  load in ED. Pharmacy has been consulted for vancomycin  dosing.  Plan: Vancomycin  dosing per level with unstable renal function Vancomycin  random level in AM  F/u plans for HD and vancomycin  dosing   Height: 5' 9 (175.3 cm) Weight: 97.5 kg (214 lb 15.2 oz) IBW/kg (Calculated) : 70.7  Temp (24hrs), Avg:98.8 F (37.1 C), Min:98.5 F (36.9 C), Max:99 F (37.2 C)  Recent Labs  Lab 06/07/23 2002 06/08/23 0505  WBC 6.0 5.6  CREATININE 3.51* 4.06*    Estimated Creatinine Clearance: 21.7 mL/min (A) (by C-G formula based on SCr of 4.06 mg/dL (H)).    Allergies  Allergen Reactions   Bactrim  [Sulfamethoxazole -Trimethoprim ]     Runs pt's sugar and phosphorus   Doxycycline  Other (See Comments)    Runs up pt's blood sugar and phosphorus   Flagyl  [Metronidazole ] Rash    Patient having hives to either the vancomycin  or flagyl  (both infusing together).   Vancomycin  Rash    Patient having hives to either vancomycin  or flagyl     Antimicrobials this admission: Vancomycin  1/12 >  Microbiology results: 1/12 R Knee cx: IP   Thank you for allowing pharmacy to be a part of this patient's care.  Leonor GORMAN Bash 06/08/2023 7:25 AM

## 2023-06-08 NOTE — Anesthesia Procedure Notes (Signed)
 Procedure Name: Intubation Date/Time: 06/08/2023 5:35 PM  Performed by: Landry Dunnings, MDPre-anesthesia Checklist: Patient identified, Emergency Drugs available, Suction available, Patient being monitored and Timeout performed Patient Re-evaluated:Patient Re-evaluated prior to induction Oxygen Delivery Method: Circle system utilized Preoxygenation: Pre-oxygenation with 100% oxygen Induction Type: IV induction Laryngoscope Size: Mac and 4 Grade View: Grade I Tube type: Oral Tube size: 7.5 mm Number of attempts: 1 Airway Equipment and Method: Stylet and Oral airway Placement Confirmation: ETT inserted through vocal cords under direct vision, positive ETCO2 and breath sounds checked- equal and bilateral Secured at: 23 cm Tube secured with: Tape Dental Injury: Teeth and Oropharynx as per pre-operative assessment

## 2023-06-08 NOTE — Anesthesia Preprocedure Evaluation (Addendum)
 Anesthesia Evaluation  Patient identified by MRN, date of birth, ID band Patient awake    Reviewed: Allergy & Precautions, NPO status , Patient's Chart, lab work & pertinent test results  Airway Mallampati: II  TM Distance: >3 FB Neck ROM: Full    Dental  (+) Dental Advisory Given, Poor Dentition Claims no loose teeth:   Pulmonary neg pulmonary ROS   Pulmonary exam normal breath sounds clear to auscultation       Cardiovascular hypertension, Pt. 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  Reproductive/Obstetrics                             Anesthesia Physical Anesthesia Plan  ASA: 4 and emergent  Anesthesia Plan: General   Post-op Pain Management: Dilaudid  IV and Tylenol  PO (pre-op)*   Induction: Intravenous, Rapid sequence and Cricoid pressure planned  PONV Risk Score and Plan: 1 and Treatment may vary due to age or medical condition, Ondansetron , Dexamethasone  and Midazolam   Airway Management Planned: Oral ETT  Additional Equipment: None  Intra-op Plan:   Post-operative Plan: Extubation in OR  Informed Consent: I have reviewed the patients History and Physical, chart, labs and discussed the procedure including the risks, benefits and alternatives for the proposed  anesthesia with the patient or authorized representative who has indicated his/her understanding and acceptance.     Dental advisory given  Plan Discussed with: CRNA and Surgeon  Anesthesia Plan Comments: (PAT note written 11/15/2021 by Allison Zelenak, PA-C. )        Anesthesia Quick Evaluation

## 2023-06-08 NOTE — Progress Notes (Signed)
 [facility]                                                                               Raymond [name], is a 63 y.o. male, DOB - [birth year], FMW:[id number] Admit date - 06/07/2023    Outpatient Primary MD for the patient is Georgina Speaks, FNP  LOS - 1  days    Brief summary   Raymond [name] is a 63 y.o. male with medical history significant of hypertension, ESRD on HD (TTS) status post kidney transplant (03/09/2015) and now back on dialysis, T2DM, hypertension, hyperlipidemia, right BKA who presents to the emergency department due to 1 month history of right knee pain that has progressively worsened , and he came to ED for further evaluation.    Assessment & Plan    Assessment and Plan:  Right knee pain from possible septic arthritis:  Synovial fluid analysis shows wbc count of 81,450. Neutrophils show 96%. Fluid cultures pending.  Orthopedics consulted, and plan for I&D later today.  Pain control with oxycodone  and dilaudid .     ESRD on HD  S/p kidney transplant in 2016. TTS  schedule. Will notify nephrology  Continue with azathioprine , mycophenolate , Tacrolimus .   Type 2 Diabetes mellitus:  CBG (last 3)  Recent Labs    06/08/23 0847 06/08/23 1125 06/08/23 1549  GLUCAP 112* 114* 118*   Resume SSI. Resume home meds.    Hypertension:  Well controlled.    Hyperlipidemia Resume lipitor 10 mg daily.      Estimated body mass index is 31.74 kg/m as calculated from the following:   Height as of this encounter: 5' 9 (1.753 m).   Weight as of this encounter: 97.5 kg.  Code Status: full code.  DVT Prophylaxis:  SCDs Start: 06/08/23 0005   Level of Care: Level of care: Med-Surg Family Communication: none at bedside.   Disposition Plan:     Remains inpatient appropriate:  IV antibiotics. I&D of the right knee  Procedures:  Incision and drainage of the right knee.   Consultants:   Orthopedics.   Antimicrobials:    Anti-infectives (From admission, onward)    Start     Dose/Rate Route Frequency Ordered Stop   06/09/23 1200  [MAR Hold]  vancomycin  (VANCOCIN ) IVPB 1000 mg/200 mL premix        (MAR Hold since Mon 06/08/2023 at 1524.Hold Reason: Transfer to a Procedural area)   1,000 mg 200 mL/hr over 60 Minutes Intravenous Every T-Th-Sa (Hemodialysis) 06/08/23 0915     06/08/23 0730  vancomycin  variable dose per unstable renal function (pharmacist dosing)  Status:  Discontinued         Does not apply See admin instructions 06/08/23 0730 06/08/23 0916   06/08/23 0045  vancomycin  (VANCOCIN ) IVPB 1000 mg/200 mL premix       Placed in Followed by Linked Group   1,000 mg 200 mL/hr over 60 Minutes Intravenous  Once 06/07/23 2318 06/08/23 0301   06/07/23 2315  vancomycin  (VANCOCIN ) IVPB 1000 mg/200 mL premix       Placed in Followed by Linked Group   1,000 mg 200 mL/hr over 60 Minutes Intravenous  Once 06/07/23 2318 06/08/23  0057        Medications  Scheduled Meds:  [MAR Hold] insulin  aspart  0-15 Units Subcutaneous TID WC   Continuous Infusions:  [MAR Hold] vancomycin      PRN Meds:.[MAR Hold] acetaminophen  **OR** [MAR Hold] acetaminophen , [MAR Hold]  HYDROmorphone  (DILAUDID ) injection, [MAR Hold] ondansetron  **OR** [MAR Hold] ondansetron  (ZOFRAN ) IV    Subjective:   Raymond [name] was seen and examined today.  Pain not well controlled.   Objective:   Vitals:   06/08/23 0600 06/08/23 1136 06/08/23 1542 06/08/23 1543  BP: (!) 157/97 (!) 148/90  (!) 140/94  Pulse: 73 72  83  Resp: 20 16  12   Temp:  99.1 F (37.3 C)  100.2 F (37.9 C)  TempSrc:  Oral    SpO2: 93% 94%  98%  Weight:   97.5 kg   Height:   5' 9 (1.753 m)     Intake/Output Summary (Last 24 hours) at 06/08/2023 1646 Last data filed at 06/08/2023 1546 Gross per 24 hour  Intake --  Output 150 ml  Net -150 ml   Filed Weights   06/07/23 1751 06/08/23 1542  Weight: 97.5 kg 97.5 kg     Exam General exam: Appears  calm and comfortable  Respiratory system: Clear to auscultation. Respiratory effort normal. Cardiovascular system: S1 & S2 heard, RRR.  Gastrointestinal system: Abdomen is nondistended, soft and nontender. Central nervous system: Alert and oriented. No focal neurological deficits. Extremities: right knee pain and tenderness. S/p BKA.  Skin: No rashes, Psychiatry: . Mood & affect appropriate.     Data Reviewed:  I have personally reviewed following labs and imaging studies   CBC Lab Results  Component Value Date   WBC 5.6 06/08/2023   RBC 3.72 (L) 06/08/2023   HGB 8.5 (L) 06/08/2023   HCT 31.1 (L) 06/08/2023   MCV 83.6 06/08/2023   MCH 22.8 (L) 06/08/2023   PLT 270 06/08/2023   MCHC 27.3 (L) 06/08/2023   RDW 17.3 (H) 06/08/2023   LYMPHSABS 0.7 05/03/2023   MONOABS 0.8 05/03/2023   EOSABS 0.1 05/03/2023   BASOSABS 0.0 05/03/2023     Last metabolic panel Lab Results  Component Value Date   NA 135 06/08/2023   K 4.0 06/08/2023   CL 99 06/08/2023   CO2 26 06/08/2023   BUN 43 (H) 06/08/2023   CREATININE 4.06 (H) 06/08/2023   GLUCOSE 133 (H) 06/08/2023   GFRNONAA 16 (L) 06/08/2023   GFRAA >60 01/20/2020   CALCIUM  8.8 (L) 06/08/2023   PHOS 5.7 (H) 06/08/2023   PROT 7.8 06/08/2023   ALBUMIN  2.2 (L) 06/08/2023   LABGLOB 3.6 02/28/2021   AGRATIO 1.1 (L) 02/28/2021   BILITOT 0.4 06/08/2023   ALKPHOS 74 06/08/2023   AST 11 (L) 06/08/2023   ALT 14 06/08/2023   ANIONGAP 10 06/08/2023    CBG (last 3)  Recent Labs    06/08/23 0847 06/08/23 1125 06/08/23 1549  GLUCAP 112* 114* 118*      Coagulation Profile: No results for input(s): INR, PROTIME in the last 168 hours.   Radiology Studies: DG Knee Complete 4 Views Right Result Date: 06/07/2023 CLINICAL DATA:  Right knee pain EXAM: RIGHT KNEE - COMPLETE 4+ VIEW COMPARISON:  None Available. FINDINGS: Status post below knee amputation. No fracture or dislocation is seen. Mild tricompartmental degenerative  changes, most prominent at the patellofemoral compartment. Moderate suprapatellar knee joint effusion. Vascular calcifications. IMPRESSION: Status post below knee amputation. No fracture or dislocation is seen. Electronically Signed  By: Pinkie Pebbles M.D.   On: 06/07/2023 19:20       Elgie Butter M.D. [facility] 06/08/2023, 4:46 PM  Available via Epic secure chat 7am-7pm After 7 pm, please refer to night coverage provider listed on amion.

## 2023-06-08 NOTE — Op Note (Signed)
 Orthopaedic Surgery Operative Note (CSN: [id number])  [name]  22-Mar-1961 Date of Surgery: 06/07/2023 - 06/08/2023   Diagnoses:  Right knee infection Septic arthritis  Procedure: Right knee arthrotomy for septic arthritis   Operative Finding Successful completion of the planned procedure.  Superolateral, anterolateral and anteromedial portal incisions.  Irrigation of 6 L NS through the knee.  Thick, loculated purulence expressed and debrided.   Post-Op Diagnosis: Same Surgeons:Primary: Onesimo Oneil LABOR, MD Assistants: None Location: [facility] OR ROOM 3 Anesthesia: General with local anesthesia Antibiotics:  No additional antibiotics.  He is receiving antibiotics on the floor. Tourniquet time: 31 minutes Estimated Blood Loss: 50 cc Complications: None Specimens:  Knee aspiration; cloudy fluid sent for cell count and culture  Loculated, thick collection of purulence was sent for culture.   Implants: None  Indications for Surgery:   Raymond [name] is a 63 y.o. male who complained of progressively worsening pain in his right knee.  This has been ongoing for 3-4 weeks.  Over the last couple days, the pain acutely worsened.  He is unable to ambulate.  Of note, he does have a BKA on the right, but does use a prosthesis for ambulation.  Since acute onset of pain, he has not been able to ambulate with his prosthesis.  In the emergency department knee aspiration was obtained, which was consistent with septic arthritis.  There was a total of 81,000 nucleated cells.  Sample was also sent for culture.  Antibiotics were initiated.  He was indicated for urgent surgery.  Benefits and risks of operative and nonoperative management were discussed prior to surgery with the patient and informed consent form was completed.  Specific risks including infection, need for additional surgery, persistent pain, knee stiffness, blood clots, progression of arthritis, systemic infection and more  severe complications associated with anesthesia were discussed.  All questions have been answered.  He elected to proceed.   Procedure:   The patient was identified properly. Informed consent was obtained and the surgical site was marked. The patient was taken to the OR where general anesthesia was induced.  The patient was positioned supine.  The right leg was prepped and draped in the usual sterile fashion.  Timeout was performed before the beginning of the case.  Tourniquet was used for the above duration.  The knee was critically evaluated.  He had has an obvious effusion.  Minimal redness.  The patella was ballotable.  Initially, we obtained approximate 10 cc of cloudy joint fluid, which was sent for cell count and culture.  We started with a superolateral portal incision.  We incised sharply through skin.  We then reached the capsule, and the incision through the capsule was elongated with a hemostat.  We then proceeded to place standard arthroscopic anteromedial and anterolateral portal incisions.  There was immediate aggression of cloudy joint fluid.  In addition, there were multiple loculated collections which were expressed from the superolateral portal incision.  We took a swab of the the loculated purulence, and sent this for culture.  We then introduced the cystoscopy tubing within the superior lateral portal incision.  We were able to visualize the fluid leaving the anterior medial and anterolateral portal incisions.  The cystoscopy tubing was moved from portal incision to portal incision.  We completed approximately 3 L of normal saline.  We then introduced the Yankauer sucker tip, and we were able to remove more loculated collections of infectious material.  We continued to range the knee, in order  to improve aggression of the normal saline.  We then introduced the cystoscopy tubing once again, and irrigated the knee with approximately 3 more liters of normal saline.  We used the Yankauer  sucker tip to remove the additional fluid from the knee.  In total, we irrigated the knee with 6 L of normal saline.  At the conclusion of this irrigation, there were no more loculated collections removed from the knee.  We used suction to remove all excess fluid.  The knee was ranged, in order to remove additional fluid from the knee.  Incisions were loosely closed with nylon suture and a sterile dressing was placed.  Patient was awoken taken to PACU in stable condition.   Post-operative plan:  The patient will be WBAT on the operative extremity Discharge home from the PACU once they have recovered DVT prophylaxis per primary team, no orthopedic contraindications.    Pain control with PRN pain medication preferring oral medicines.   Dressing change approximately 48 hours after surgery Follow up plan will be scheduled in approximately 10-14 days for incision check and XR.

## 2023-06-08 NOTE — ED Notes (Signed)
 Patient transported to OR.

## 2023-06-08 NOTE — Consult Note (Signed)
 ORTHOPAEDIC CONSULTATION  REQUESTING PHYSICIAN: Cherlyn Labella, MD  ASSESSMENT AND PLAN: 63 y.o. male with the following: Septic right knee arthritis  This patient requires inpatient admission to manage this problem appropriately.  White count from the right knee aspirate is 81,000, consistent with an infected right knee.  As such, I am recommending irrigation debridement of the right knee.  This should be done in an urgent fashion.  He is to remain NPO.  Culture has been sent, with the aspirate, so antibiotics can be initiated.    - Weight Bearing Status/Activity: Weightbearing as tolerated  - Additional recommended labs/tests: None  -VTE Prophylaxis: As needed  - Pain control: As needed  - Follow-up plan: To be determined  -Procedures: Plan for incision and drainage of right knee.  This will be completed in an urgent fashion.  Ultimately, he may require more than 1 procedure.  Chief Complaint: Right knee pain  HPI: Raymond [name] is a 63 y.o. male with past medical history as listed below.  He is a diabetic.  He is in end-stage renal disease, on dialysis.  As such, he is immunocompromise.  He reports worsening pain in the right knee for the past 3-4 weeks.  He states it got acutely worse over the past 1-2 days.  He was unable to ambulate.  He does have a history of right BKA, but does ambulate with the assistance of a prosthesis.  However, the pain was so severe, he was unable to ambulate.  He denies fevers and chills.  No recent injury to his right knee.  Past Medical History:  Diagnosis Date   Diabetes (HCC)    Diabetes mellitus without complication (HCC)    type 2   ESRD (end stage renal disease) (HCC)    03/09/2015- patient had a kidney transplant   Hypertension    Nausea vomiting and diarrhea 12/10/2015   Peripheral vascular disease (HCC)    Renal disorder    Renal insufficiency    Past Surgical History:  Procedure Laterality Date   ABDOMINAL AORTOGRAM  W/LOWER EXTREMITY N/A 09/11/2021   Procedure: ABDOMINAL AORTOGRAM W/LOWER EXTREMITY;  Surgeon: Darron Deatrice LABOR, MD;  Location: [facility];  Service: Cardiovascular;  Laterality: N/A;   AMPUTATION Right 09/25/2021   Procedure: RIGHT TRANSMETATARSAL AMPUTATION AND APPLY TISSUE GRAFT;  Surgeon: Harden Jerona GAILS, MD;  Location: [facility] OR;  Service: Orthopedics;  Laterality: Right;   AMPUTATION Right 11/16/2021   Procedure: RIGHT AMPUTATION BELOW KNEE WITH WOUND VAC APPLICATION;  Surgeon: Harden Jerona GAILS, MD;  Location: [facility] OR;  Service: Orthopedics;  Laterality: Right;   BONE BIOPSY Right 03/06/2021   Procedure: BONE BIOPSY;  Surgeon: Gershon Donnice SAUNDERS, DPM;  Location: [facility] ORS;  Service: Podiatry;  Laterality: Right;   CENTRAL VENOUS CATHETER INSERTION Right 01/20/2020   Procedure: INSERTION CENTRAL LINE ADULT; Tunneled central line;  Surgeon: Kallie Manuelita BROCKS, MD;  Location: [facility] ORS;  Service: General;  Laterality: Right;   COLONOSCOPY N/A 12/05/2014   DOQ:fnizmjuz external and internal hemorrhoid/mild diverticulosis/11 polyps removed   ESOPHAGOGASTRODUODENOSCOPY N/A 12/05/2014   DOQ:fpoi duodenitis   GRAFT APPLICATION Right 03/06/2021   Procedure: GRAFT APPLICATION;  Surgeon: Gershon Donnice SAUNDERS, DPM;  Location: [facility] ORS;  Service: Podiatry;  Laterality: Right;   INCISION AND DRAINAGE OF WOUND Right 08/15/2021   Procedure: IRRIGATION AND DEBRIDEMENT WOUND;  Surgeon: Burt Fus, DPM;  Location: [facility] ORS;  Service: Podiatry;  Laterality: Right;  need to make card not  a irrigation and debridement  card   IR FLUORO GUIDE CV LINE RIGHT  03/01/2019   IR PERC TUN PERIT CATH WO PORT S&I /IMAG  06/27/2021   IR REMOVAL TUN CV CATH W/O FL  05/10/2019   IR REMOVAL TUN CV CATH W/O FL  02/29/2020   IR REMOVAL TUN CV CATH W/O FL  07/19/2021   IR US  GUIDE VASC ACCESS RIGHT  03/01/2019   IR US  GUIDE VASC ACCESS RIGHT  06/27/2021   IRRIGATION AND DEBRIDEMENT ELBOW Left 06/24/2021   Procedure: IRRIGATION AND  DEBRIDEMENT ELBOW;  Surgeon: Leora Lynwood SAUNDERS, MD;  Location: [facility] ORS;  Service: Orthopedics;  Laterality: Left;   KIDNEY TRANSPLANT  03/27/2015   Penile Pump Insertion     PERIPHERAL VASCULAR ATHERECTOMY  09/11/2021   Procedure: PERIPHERAL VASCULAR ATHERECTOMY;  Surgeon: Darron Deatrice LABOR, MD;  Location: [facility];  Service: Cardiovascular;;  Shockwave Lithotripsy   TEE WITHOUT CARDIOVERSION N/A 01/17/2020   Procedure: TRANSESOPHAGEAL ECHOCARDIOGRAM (TEE) WITH PROPOFOL ;  Surgeon: Alvan Dorn FALCON, MD;  Location: [facility];  Service: Endoscopy;  Laterality: N/A;   WOUND DEBRIDEMENT Right 03/06/2021   Procedure: DEBRIDEMENT WOUND;  Surgeon: Gershon Donnice SAUNDERS, DPM;  Location: [facility] OR[facility];  Service: Podiatry;  Laterality: Right;   Social History   Socioeconomic History   Marital status: Married    Spouse name: Not on file   Number of children: Not on file   Years of education: Not on file   Highest education level: Not on file  Occupational History   Not on file  Tobacco Use   Smoking status: Never   Smokeless tobacco: Never  Vaping Use   Vaping status: Never Used  Substance and Sexual Activity   Alcohol use: No    Alcohol/week: 0.0 standard drinks of alcohol   Drug use: No   Sexual activity: Yes  Other Topics Concern   Not on file  Social History Narrative   ** Merged History Encounter **       Social Drivers of Corporate Investment Banker Strain: Not on file  Food Insecurity: Low Risk  (04/23/2023)   Received from [facility]   Hunger Vital Sign    Worried About Running Out of Food in the Last Year: Never true    Ran Out of Food in the Last Year: Never true  Transportation Needs: No Transportation Needs (04/23/2023)   Received from [facility]    In the past 12 months, has lack of reliable transportation kept you from medical appointments, meetings, work or from getting things needed for daily living? : No  Physical Activity: Not on file   Stress: Not on file  Social Connections: Not on file   Family History  Problem Relation Age of Onset   Diabetes Mother    Heart disease Father    Colon cancer Neg Hx    Allergies  Allergen Reactions   Bactrim  [Sulfamethoxazole -Trimethoprim ]     Runs pt's sugar and phosphorus   Doxycycline  Other (See Comments)    Runs up pt's blood sugar and phosphorus   Flagyl  [Metronidazole ] Rash    Patient having hives to either the vancomycin  or flagyl  (both infusing together).   Vancomycin  Rash    Patient having hives to either vancomycin  or flagyl    Prior to Admission medications   Medication Sig Start Date End Date Taking? Authorizing Provider  atorvastatin  (LIPITOR) 10 MG tablet Take 1 tablet (10 mg total) by mouth daily. 05/18/23   Court Dorn PARAS,  MD  Azathioprine  75 MG TABS Take 2 tablets by mouth daily.    [provider]  blood glucose meter kit and supplies KIT Dispense based on patient and insurance preference. Use up to four times daily as directed. (FOR ICD-9 250.00, 250.01). 04/11/20   [name], Gebreselassie W, MD  Blood Glucose Monitoring Suppl (ACCU-CHEK GUIDE ME) w/Device KIT 1 Piece by Does not apply route as directed. 02/18/18   [name], Gebreselassie W, MD  brimonidine  (ALPHAGAN ) 0.2 % ophthalmic solution Place 1 drop into both eyes 3 (three) times daily. 07/25/21   [provider]  cloNIDine  (CATAPRES  - DOSED IN MG/24 HR) 0.3 mg/24hr patch Place 1 patch (0.3 mg total) onto the skin once a week. Patient taking differently: Place 0.3 mg onto the skin once a week. Place patch on Sunday 08/21/21   Court Dorn PARAS, MD  Continuous Blood Gluc Receiver (DEXCOM G7 RECEIVER) DEVI Use to check blood sugar 3 times a day. Dx code e11.65 12/16/21   Georgina Speaks, FNP  Continuous Glucose Sensor (DEXCOM G7 SENSOR) MISC USE TO CHECK GLUCOSE THREE TIMES DAILY AS DIRECTED, CHANGE SENSOR EVERY 10 DAYS. 02/03/23   Thapa, Sudan, MD  dorzolamide -timolol  (COSOPT ) 22.3-6.8 MG/ML ophthalmic  solution Place 1 drop into both eyes 2 (two) times daily. 07/25/21   [provider]  Dulaglutide  (TRULICITY ) 3 MG/0.5ML SOPN Inject 3 mg as directed once a week. 01/27/23   Thapa, Sudan, MD  furosemide  (LASIX ) 20 MG tablet 20 mg 2 (two) times daily. 04/23/22   [provider]  gabapentin  (NEURONTIN ) 300 MG capsule Take 3 capsules (900 mg total) by mouth 3 (three) times daily. 3 times a day 11/22/21   Valdemar Longs R, NP  glucose blood (ACCU-CHEK GUIDE) test strip Use as instructed 4 x daily. E11.65 02/19/18   [name], Gebreselassie W, MD  hydrALAZINE  (APRESOLINE ) 25 MG tablet Take 1 tablet (25 mg total) by mouth 3 (three) times daily. 02/05/22   Moore, Janece, FNP  HYDROcodone -acetaminophen  (NORCO/VICODIN) 5-325 MG tablet Take 1 tablet by mouth every 6 (six) hours as needed. 05/03/23   Schutt, Marsa HERO, PA-C  latanoprost  (XALATAN ) 0.005 % ophthalmic solution Place 1 drop into both eyes at bedtime. 07/25/21   [provider]  lidocaine  (LIDODERM ) 5 % Place 1 patch onto the skin daily. Remove & Discard patch within 12 hours or as directed by MD 05/03/23   Schutt, Marsa HERO, PA-C  Multiple Vitamin (MULTIVITAMIN) capsule Take 1 capsule by mouth daily.    [provider]  mupirocin  ointment (BACTROBAN ) 2 % Apply 1 application. topically 2 (two) times daily. For wound care 09/12/21   Stover, Titorya, DPM  mycophenolate  (MYFORTIC ) 360 MG TBEC EC tablet Take 720 mg by mouth 2 (two) times daily. 03/19/23   [provider]  NOVOLIN  N FLEXPEN 100 UNIT/ML FlexPen INJECT 30 UNITS SUBCUTANEOUSLY IN THE MORNING AND 20 UNITS with SUPPER. 01/27/23   Thapa, Sudan, MD  NOVOLOG  FLEXPEN 100 UNIT/ML FlexPen INJECT 8 UNITS SUBCUTANEOUSLY with Dinner. 01/27/23   Thapa, Sudan, MD  nystatin  (MYCOSTATIN ) 100000 UNIT/ML suspension Take 5 mLs by mouth 4 (four) times daily. 03/19/23   [provider]  ondansetron  (ZOFRAN ) 4 MG tablet Take 1 tablet (4 mg total) by mouth every 6 (six) hours as  needed for nausea. 11/19/21   Pokhrel, Laxman, MD  sodium bicarbonate  650 MG tablet Take 1,300 mg by mouth 3 (three) times daily. 11/02/18   [provider]  tacrolimus  (PROGRAF ) 1 MG capsule Take 4 capsules (  4 mg total) by mouth 2 (two) times daily. 08/30/21   Rai, Ripudeep K, MD  Vitamin D , Ergocalciferol , (DRISDOL ) 50000 units CAPS capsule Take 50,000 Units by mouth See admin instructions. 15th of the month    [provider]   DG Knee Complete 4 Views Right Result Date: 06/07/2023 CLINICAL DATA:  Right knee pain EXAM: RIGHT KNEE - COMPLETE 4+ VIEW COMPARISON:  None Available. FINDINGS: Status post below knee amputation. No fracture or dislocation is seen. Mild tricompartmental degenerative changes, most prominent at the patellofemoral compartment. Moderate suprapatellar knee joint effusion. Vascular calcifications. IMPRESSION: Status post below knee amputation. No fracture or dislocation is seen. Electronically Signed   By: Pinkie Pebbles M.D.   On: 06/07/2023 19:20   Family History Reviewed and non-contributory, no pertinent history of problems with bleeding or anesthesia    Review of Systems No fevers or chills No numbness or tingling No chest pain No shortness of breath No bowel or bladder dysfunction No GI distress No headaches    OBJECTIVE  Vitals:Patient Vitals for the past 8 hrs:  BP Temp Pulse Resp SpO2  06/08/23 0600 (!) 157/97 -- 73 20 93 %  06/08/23 0436 -- 98.5 F (36.9 C) -- -- --  06/08/23 0400 (!) 147/79 -- 76 -- 98 %  06/08/23 0300 (!) 161/92 -- 72 17 90 %  06/08/23 0200 139/83 -- 71 17 97 %  06/08/23 0020 (!) 141/92 -- 70 -- 97 %  06/08/23 0015 -- 98.9 F (37.2 C) -- -- --  06/08/23 0004 -- -- -- -- 98 %   General: Alert, no acute distress Cardiovascular: Warm extremities noted Respiratory: No cyanosis, no use of accessory musculature GI: No organomegaly, abdomen is soft and non-tender Skin: No lesions in the area of chief complaint other  than those listed below in MSK exam.  Neurologic: Sensation intact distally save for the below mentioned MSK exam Psychiatric: Patient is competent for consent with normal mood and affect Lymphatic: No swelling obvious and reported other than the area involved in the exam below Extremities   Evaluation of the right lower extremity demonstrates a well-healed below-knee amputation.  No issues with the surgical incision.  His right knee is swollen.  Mild redness about the knee.  His arc of motion is from approximately 10-80 degrees.  No specific point tenderness.  No fluctuance.  No abscess.  Test Results Imaging  X-ray of the right knee demonstrates a well-healed below-knee amputation.  Mild to moderate degenerative changes, with well-maintained joint space.  Small osteophytes are appreciated.  Suprapatellar knee effusion.   Labs cbc Recent Labs    06/07/23 2002 06/08/23 0505  WBC 6.0 5.6  HGB 9.1* 8.5*  HCT 32.8* 31.1*  PLT 262 270      Recent Labs    06/07/23 2002 06/08/23 0505  NA 136 135  K 4.0 4.0  CL 101 99  CO2 26 26  GLUCOSE 93 133*  BUN 40* 43*  CREATININE 3.51* 4.06*  CALCIUM  8.9 8.8*

## 2023-06-08 NOTE — ED Notes (Signed)
 Admitting MD at bedside.

## 2023-06-09 ENCOUNTER — Encounter (HOSPITAL_COMMUNITY): Payer: Self-pay | Admitting: Orthopedic Surgery

## 2023-06-09 LAB — GLUCOSE, BODY FLUID OTHER: Glucose, Body Fluid Other: 6 mg/dL

## 2023-06-09 LAB — GLUCOSE, CAPILLARY
Glucose-Capillary: 160 mg/dL — ABNORMAL HIGH (ref 70–99)
Glucose-Capillary: 194 mg/dL — ABNORMAL HIGH (ref 70–99)
Glucose-Capillary: 202 mg/dL — ABNORMAL HIGH (ref 70–99)
Glucose-Capillary: 207 mg/dL — ABNORMAL HIGH (ref 70–99)
Glucose-Capillary: 310 mg/dL — ABNORMAL HIGH (ref 70–99)
Glucose-Capillary: 72 mg/dL (ref 70–99)
Glucose-Capillary: 83 mg/dL (ref 70–99)

## 2023-06-09 MED ORDER — INSULIN GLARGINE-YFGN 100 UNIT/ML ~~LOC~~ SOLN
15.0000 [IU] | Freq: Every day | SUBCUTANEOUS | Status: DC
  Administered 2023-06-10 – 2023-06-12 (×3): 15 [IU] via SUBCUTANEOUS
  Filled 2023-06-09 (×4): qty 0.15

## 2023-06-09 MED ORDER — INSULIN NPH (HUMAN) (ISOPHANE) 100 UNIT/ML ~~LOC~~ SUSP: 15.0000 [IU] | Freq: Every day | SUBCUTANEOUS | Status: DC

## 2023-06-09 MED ORDER — DEXTROSE 50 % IV SOLN
25.0000 mL | Freq: Once | INTRAVENOUS | Status: AC
  Administered 2023-06-09: 25 mL via INTRAVENOUS
  Filled 2023-06-09: qty 50

## 2023-06-09 MED ORDER — INSULIN NPH (HUMAN) (ISOPHANE) 100 UNIT/ML ~~LOC~~ SUSP: 30.0000 [IU] | Freq: Every day | SUBCUTANEOUS | Status: DC

## 2023-06-09 MED ORDER — SODIUM CHLORIDE 0.9 % IV SOLN
2.0000 g | INTRAVENOUS | Status: DC
  Administered 2023-06-09: 2 g via INTRAVENOUS
  Filled 2023-06-09: qty 12.5

## 2023-06-09 MED ORDER — CHLORHEXIDINE GLUCONATE CLOTH 2 % EX PADS
6.0000 | MEDICATED_PAD | Freq: Every day | CUTANEOUS | Status: DC
  Administered 2023-06-10 – 2023-06-12 (×3): 6 via TOPICAL

## 2023-06-09 NOTE — Plan of Care (Signed)
  Problem: Acute Rehab PT Goals(only PT should resolve) Goal: Pt Will Go Supine/Side To Sit Flowsheets (Taken 06/09/2023 1411) Pt will go Supine/Side to Sit: with modified independence Goal: Patient Will Transfer Sit To/From Stand Flowsheets (Taken 06/09/2023 1411) Patient will transfer sit to/from stand: with modified independence Goal: Pt Will Transfer Bed To Chair/Chair To Bed Flowsheets (Taken 06/09/2023 1411) Pt will Transfer Bed to Chair/Chair to Bed: with modified independence Goal: Pt Will Ambulate Flowsheets (Taken 06/09/2023 1411) Pt will Ambulate:  10 feet  with minimal assist  with rolling walker     Raymond [name] SPT

## 2023-06-09 NOTE — Evaluation (Signed)
 Physical Therapy Evaluation Patient Details Name: Raymond [name] MRN: [id number] DOB: 1961/05/03 Today's Date: 06/09/2023  History of Present Illness  Raymond [name] is a 63 y.o. male with medical history significant of hypertension, ESRD on HD (TTS) status post kidney transplant (03/09/2015) and now back on dialysis, T2DM, hypertension, hyperlipidemia, right BKA who presents to the emergency department due to 1 month history of right knee pain that has progressively worsened.  Patient complained of worsening pain today, he complained of an aching pressure and throbbing pain of the right knee, so he decided to go to the ED for further evaluation and management.   Clinical Impression  Pt was in bed at the beginning of session and was agreeable to therapy. Pt was able to go from supine to sit with supervision. The movement was slow, labored and required the left leg to be kicked to gain momentum for mobility but did not need PT assistance. Pt was able to transfer from bed to chair with CTG with the use of chair arm rests for support. Pt performed a stand pivot transfer for going from bed to chair and back. Prosthetic was not able to be put on due to swelling of right leg. Patient will benefit from continued skilled physical therapy in hospital and recommended venue below to increase strength, balance, endurance for safe ADLs and gait.        If plan is discharge home, recommend the following: A little help with walking and/or transfers;A little help with bathing/dressing/bathroom;Help with stairs or ramp for entrance;Assistance with cooking/housework   Can travel by private vehicle        Equipment Recommendations    Recommendations for Other Services       Functional Status Assessment Patient has had a recent decline in their functional status and demonstrates the ability to make significant improvements in function in a reasonable and predictable amount of time.      Precautions / Restrictions Precautions Precautions: Fall Required Braces or Orthoses: Other Brace Other Brace: right prosthetic- Restrictions Weight Bearing Restrictions Per Provider Order: No      Mobility  Bed Mobility Overal bed mobility: Needs Assistance Bed Mobility: Supine to Sit     Supine to sit: Supervision     General bed mobility comments: movement was slow and labored. had to kick the left leg to gain momentum for movement. Patient Response: Cooperative  Transfers Overall transfer level: Independent                 General transfer comment: Pt had chair set up in front of bed and stood on left leg and pivoted to the chair, limited in transfer as prosthetic would not fit due to swelling in right leg    Ambulation/Gait                  Stairs            Wheelchair Mobility     Tilt Bed Tilt Bed Patient Response: Cooperative  Modified Rankin (Stroke Patients Only)       Balance Overall balance assessment: Needs assistance Sitting-balance support: Bilateral upper extremity supported, Feet supported Sitting balance-Leahy Scale: Good     Standing balance support: Bilateral upper extremity supported Standing balance-Leahy Scale: Fair Standing balance comment: Patient performed a stand pivot transfer with the use of chair air rest for support when standing  Pertinent Vitals/Pain Pain Assessment Pain Assessment: Faces Faces Pain Scale: Hurts a little bit Pain Location: Right knee Pain Descriptors / Indicators: Sore Pain Intervention(s): Monitored during session, Limited activity within patient's tolerance    Home Living Family/patient expects to be discharged to:: Private residence Living Arrangements: Spouse/significant other Available Help at Discharge: Family;Available 24 hours/day Type of Home: House Home Access: Stairs to enter Entrance Stairs-Rails: Tax Inspector of Steps: 4   Home Layout: One level Home Equipment: Agricultural Consultant (2 wheels);Wheelchair - manual      Prior Function Prior Level of Function : Independent/Modified Independent               ADLs Comments: Independent with ADLs and IADLs     Extremity/Trunk Assessment                Communication      Cognition                                                General Comments      Exercises     Assessment/Plan    PT Assessment Patient needs continued PT services  PT Problem List Decreased strength;Decreased balance;Decreased mobility;Decreased activity tolerance       PT Treatment Interventions DME instruction;Gait training;Stair training;Functional mobility training;Therapeutic activities;Therapeutic exercise;Balance training;Patient/family education    PT Goals (Current goals can be found in the Care Plan section)  Acute Rehab PT Goals Patient Stated Goal: Return home PT Goal Formulation: With patient Time For Goal Achievement: 06/23/23 Potential to Achieve Goals: Good    Frequency Min 3X/week     Co-evaluation               AM-PAC PT 6 Clicks Mobility  Outcome Measure Help needed turning from your back to your side while in a flat bed without using bedrails?: A Little Help needed moving from lying on your back to sitting on the side of a flat bed without using bedrails?: A Little Help needed moving to and from a bed to a chair (including a wheelchair)?: A Little Help needed standing up from a chair using your arms (e.g., wheelchair or bedside chair)?: A Little Help needed to walk in hospital room?: A Lot Help needed climbing 3-5 steps with a railing? : A Lot 6 Click Score: 16    End of Session   Activity Tolerance: Patient tolerated treatment well Patient left: in chair;with call bell/phone within reach;with family/visitor present Nurse Communication: Mobility status PT Visit Diagnosis: Unsteadiness  on feet (R26.81);Muscle weakness (generalized) (M62.81);Other abnormalities of gait and mobility (R26.89)    Time: 8975-8955 PT Time Calculation (min) (ACUTE ONLY): 20 min   Charges:   PT Evaluation $PT Eval Moderate Complexity: 1 Mod PT Treatments $Therapeutic Activity: 8-22 mins PT General Charges $$ ACUTE PT VISIT: 1 Visit         Breuna Loveall SPT

## 2023-06-09 NOTE — Progress Notes (Signed)
 Pharmacy Antibiotic Note  Danilo Cappiello is a 63 y.o. male admitted on 06/07/2023 with concern for infectious arthritis of R knee. Of note, PMH significant for ESRD on HD TThS. Vancomycin  load in ED. Pharmacy has been consulted for vancomycin  and cefepime  dosing. Tmax 100.2 WBC WNL. Patient on immunosuppressants(azathiprine, prograf  and mychophenolate)  Plan: Vancomycin  1gm every T,TH, Sat after HD Cefepime  2gm IV every TThSat after HD F/u cxs and clinical progress Monitor V/S, labs and levels as indicated  Height: 5' 9 (175.3 cm) Weight: 97.5 kg (214 lb 15.2 oz) IBW/kg (Calculated) : 70.7  Temp (24hrs), Avg:98.9 F (37.2 C), Min:98 F (36.7 C), Max:100.2 F (37.9 C)  Recent Labs  Lab 06/07/23 2002 06/08/23 0505  WBC 6.0 5.6  CREATININE 3.51* 4.06*    Estimated Creatinine Clearance: 21.7 mL/min (A) (by C-G formula based on SCr of 4.06 mg/dL (H)).    Allergies  Allergen Reactions   Bactrim  [Sulfamethoxazole -Trimethoprim ]     Runs pt's sugar and phosphorus   Doxycycline  Other (See Comments)    Runs up pt's blood sugar and phosphorus   Flagyl  [Metronidazole ] Rash    Patient having hives to either the vancomycin  or flagyl  (both infusing together).   Vancomycin  Rash    Patient having hives to either vancomycin  or flagyl     Antimicrobials this admission: Vancomycin  1/12 > Cefepime  1/14>>  Microbiology results: 1/12 synovial fluid: ngtd 1/13 wound cx: gram stain neg   Thank you for allowing pharmacy to be a part of this patient's care.  Billyjoe Go, BS Pharm D, BCPS Clinical Pharmacist 06/09/2023 11:58 AM

## 2023-06-09 NOTE — TOC Progression Note (Signed)
 Transition of Care [facility]) - Progression Note    Patient Details  Name: Raymond [name] MRN: [id number] Date of Birth: November 18, 1960  [facility] CM/SW Contact  Noreen KATHEE [place], [place] Phone Number: 06/09/2023, 4:27 PM  Clinical Narrative:    CSW spoke with spouse who was at bedside regarding HHPT recommendation in the home . However based on pt insurance CSW explained that it will be more expensive, and spouse was agreeable to do OP PT in [place] since it will be less. CSW placed order and made MD aware. TOC will continue to follow.      Barriers to Discharge: Continued Medical Work up  Expected Discharge Plan and Services    DC home with OP PT.      Social Determinants of Health (SDOH) Interventions SDOH Screenings   Food Insecurity: No Food Insecurity (06/08/2023)  Housing: Low Risk  (06/08/2023)  Transportation Needs: No Transportation Needs (06/08/2023)  Utilities: Not At Risk (06/08/2023)  Depression (PHQ2-9): Low Risk  (10/16/2022)  Tobacco Use: Low Risk  (06/08/2023)    Readmission Risk Interventions    06/09/2023    4:26 PM 06/08/2023    4:25 PM 06/08/2023   11:54 AM  Readmission Risk Prevention Plan  Transportation Screening Complete Complete Complete  HRI or Home Care Consult Complete Complete Complete  Social Work Consult for Recovery Care Planning/Counseling Complete Complete Complete  Palliative Care Screening Not Applicable Not Applicable Not Applicable  Medication Review Oceanographer) Complete Complete Complete

## 2023-06-09 NOTE — Procedures (Signed)
   Nephrology Nursing Note:  Due to acuity of high HD pt census, Raymond [name] HD session is re-scheduled for tomorrow, 06/10/23.  Per t.o. Dr. Arrie Aran.   Arman Filter, RN AP KDU

## 2023-06-09 NOTE — Progress Notes (Signed)
 [facility]                                                                               Raymond [name], is a 63 y.o. male, DOB - 1960-05-27, FMW:[id number] Admit date - 06/07/2023    Outpatient Primary MD for the patient is Georgina Speaks, FNP  LOS - 2  days    Brief summary   Raymond [name] is a 63 y.o. male with medical history significant of hypertension, ESRD on HD (TTS) status post kidney transplant (03/09/2015) and now back on dialysis, T2DM, hypertension, hyperlipidemia, right BKA who presents to the emergency department due to 1 month history of right knee pain that has progressively worsened , and he came to ED for further evaluation.    Assessment & Plan    Assessment and Plan:  Right knee pain from possible septic arthritis:  Synovial fluid analysis shows wbc count of 81,450. Neutrophils show 96%. Fluid cultures pending.  Orthopedics consulted, and  underwent  arthrotomy for septic arthritis.  He was started on IV vancomycin  and IV cefepime  added.  Cultures are pending, not ready for discharge yet. Dicussed with Dr Dennise with ID,  recommended to wait for the cultures.  Pain control with oxycodone  and dilaudid .     ESRD on HD  S/p kidney transplant in 2016. TTS  schedule.  Continue with azathioprine , mycophenolate , Tacrolimus .   Type 2 Diabetes mellitus:  CBG (last 3)  Recent Labs    06/09/23 0734 06/09/23 1003 06/09/23 1123  GLUCAP 207* 194* 160*   Resume SSI. Resume home meds.    Hypertension:  Well controlled.    Hyperlipidemia Resume lipitor 10 mg daily.    Anemia of chronic disease/ from ESRD  Hemoglobin around 8. Monitor.      Estimated body mass index is 31.74 kg/m as calculated from the following:   Height as of this encounter: 5' 9 (1.753 m).   Weight as of this encounter: 97.5 kg.  Code Status: full code.  DVT Prophylaxis:  SCDs Start: 06/08/23 0005   Level of Care: Level of care: Med-Surg Family  Communication: none at bedside.   Disposition Plan:     Remains inpatient appropriate:  IV antibiotics. Awaiting cultures.   Procedures:  Arthrotomy of the septic arthritis on 1/13  Consultants:   Orthopedics.   Antimicrobials:   Anti-infectives (From admission, onward)    Start     Dose/Rate Route Frequency Ordered Stop   06/09/23 1600  ceFEPIme  (MAXIPIME ) 2 g in sodium chloride  0.9 % 100 mL IVPB        2 g 200 mL/hr over 30 Minutes Intravenous Every T-Th-Sa (Hemodialysis) 06/09/23 1210     06/09/23 1200  vancomycin  (VANCOCIN ) IVPB 1000 mg/200 mL premix        1,000 mg 200 mL/hr over 60 Minutes Intravenous Every T-Th-Sa (Hemodialysis) 06/08/23 0915     06/08/23 0730  vancomycin  variable dose per unstable renal function (pharmacist dosing)  Status:  Discontinued         Does not apply See admin instructions 06/08/23 0730 06/08/23 0916   06/08/23 0045  vancomycin  (VANCOCIN ) IVPB 1000 mg/200 mL premix  Placed in Followed by Linked Group   1,000 mg 200 mL/hr over 60 Minutes Intravenous  Once 06/07/23 2318 06/08/23 0301   06/07/23 2315  vancomycin  (VANCOCIN ) IVPB 1000 mg/200 mL premix       Placed in Followed by Linked Group   1,000 mg 200 mL/hr over 60 Minutes Intravenous  Once 06/07/23 2318 06/08/23 0057        Medications  Scheduled Meds:  atorvastatin   10 mg Oral Daily   azaTHIOprine   150 mg Oral Daily   brimonidine   1 drop Both Eyes TID   Chlorhexidine  Gluconate Cloth  6 each Topical Q0600   [START ON 06/14/2023] cloNIDine   0.3 mg Transdermal Weekly   dorzolamide -timolol   1 drop Both Eyes BID   furosemide   20 mg Oral BID   gabapentin   900 mg Oral TID   hydrALAZINE   25 mg Oral TID   insulin  aspart  0-15 Units Subcutaneous TID WC   insulin  NPH Human  30 Units Subcutaneous QAC breakfast   And   insulin  NPH Human  30 Units Subcutaneous QHS   latanoprost   1 drop Both Eyes QHS   mupirocin  ointment  1 Application Topical BID   mycophenolate   720 mg Oral BID    sodium bicarbonate   1,300 mg Oral TID   tacrolimus   4 mg Oral BID   Continuous Infusions:  ceFEPime  (MAXIPIME ) IV     vancomycin      PRN Meds:.acetaminophen  **OR** acetaminophen , HYDROmorphone  (DILAUDID ) injection, ondansetron  **OR** ondansetron  (ZOFRAN ) IV, oxyCODONE -acetaminophen     Subjective:   Raymond [name] was seen and examined today.  Pain better controlled.   Objective:   Vitals:   06/08/23 1845 06/08/23 1900 06/09/23 0405 06/09/23 0735  BP: (!) 185/97 (!) 163/90 130/84 132/65  Pulse: 98 97 70 73  Resp: 15 14 16 16   Temp:  98.9 F (37.2 C) 98 F (36.7 C)   TempSrc:  Oral Oral   SpO2: 95% 96%  94%  Weight:      Height:        Intake/Output Summary (Last 24 hours) at 06/09/2023 1520 Last data filed at 06/09/2023 1300 Gross per 24 hour  Intake 940 ml  Output 170 ml  Net 770 ml   Filed Weights   06/07/23 1751 06/08/23 1542  Weight: 97.5 kg 97.5 kg     Exam General exam: Appears calm and comfortable  Respiratory system: Clear to auscultation. Respiratory effort normal. Cardiovascular system: S1 & S2 heard, RRR.  Gastrointestinal system: Abdomen is nondistended, soft and nontender.  Central nervous system: Alert and oriented. No focal neurological deficits. Extremities: Right knee tenderness s/p BKA.  Skin: No rashes, Psychiatry: Mood & affect appropriate.    Data Reviewed:  I have personally reviewed following labs and imaging studies   CBC Lab Results  Component Value Date   WBC 5.6 06/08/2023   RBC 3.72 (L) 06/08/2023   HGB 8.5 (L) 06/08/2023   HCT 31.1 (L) 06/08/2023   MCV 83.6 06/08/2023   MCH 22.8 (L) 06/08/2023   PLT 270 06/08/2023   MCHC 27.3 (L) 06/08/2023   RDW 17.3 (H) 06/08/2023   LYMPHSABS 0.7 05/03/2023   MONOABS 0.8 05/03/2023   EOSABS 0.1 05/03/2023   BASOSABS 0.0 05/03/2023     Last metabolic panel Lab Results  Component Value Date   NA 135 06/08/2023   K 4.0 06/08/2023   CL 99 06/08/2023   CO2 26 06/08/2023   BUN  43 (H) 06/08/2023   CREATININE 4.06 (H) 06/08/2023  GLUCOSE 133 (H) 06/08/2023   GFRNONAA 16 (L) 06/08/2023   GFRAA >60 01/20/2020   CALCIUM  8.8 (L) 06/08/2023   PHOS 5.7 (H) 06/08/2023   PROT 7.8 06/08/2023   ALBUMIN  2.2 (L) 06/08/2023   LABGLOB 3.6 02/28/2021   AGRATIO 1.1 (L) 02/28/2021   BILITOT 0.4 06/08/2023   ALKPHOS 74 06/08/2023   AST 11 (L) 06/08/2023   ALT 14 06/08/2023   ANIONGAP 10 06/08/2023    CBG (last 3)  Recent Labs    06/09/23 0734 06/09/23 1003 06/09/23 1123  GLUCAP 207* 194* 160*      Coagulation Profile: No results for input(s): INR, PROTIME in the last 168 hours.   Radiology Studies: DG Knee Complete 4 Views Right Result Date: 06/07/2023 CLINICAL DATA:  Right knee pain EXAM: RIGHT KNEE - COMPLETE 4+ VIEW COMPARISON:  None Available. FINDINGS: Status post below knee amputation. No fracture or dislocation is seen. Mild tricompartmental degenerative changes, most prominent at the patellofemoral compartment. Moderate suprapatellar knee joint effusion. Vascular calcifications. IMPRESSION: Status post below knee amputation. No fracture or dislocation is seen. Electronically Signed   By: Pinkie Pebbles M.D.   On: 06/07/2023 19:20       Elgie Butter M.D. [facility] 06/09/2023, 3:20 PM  Available via Epic secure chat 7am-7pm After 7 pm, please refer to night coverage provider listed on amion.

## 2023-06-09 NOTE — Consult Note (Signed)
 [facility] Renal Consultation Note    Indication for Consultation:  Management of ESRD/hemodialysis; anemia, hypertension/volume and secondary hyperparathyroidism  HPI: Raymond [name] is a 63 y.o. male with PMH significant for DM, HTN, ESRD s/p kidney transplant now back on HD TTS at [facility] who presented to [facility] ED on 06/07/23 c/o worsening right knee pain.  In the ED, Temp 99, Bp 111/89, HR 76, RR 16, SpO2 96%.  Labs notable for Hgb 9.1, otherwise unremarkable.  He underwent joint aspiration which was notable for 81,450 WBC's and Xray with effusion but no fracture.  He was admitted for septic arthritis and started on IV antibiotics.  He underwent right knee arthrotomy on 06/08/23.  We were consulted to provide dialysis during his hospitalization.  Past Medical History:  Diagnosis Date   Diabetes (HCC)    Diabetes mellitus without complication (HCC)    type 2   ESRD (end stage renal disease) (HCC)    03/09/2015- patient had a kidney transplant   Hypertension    Nausea vomiting and diarrhea 12/10/2015   Peripheral vascular disease (HCC)    Renal disorder    Renal insufficiency    Past Surgical History:  Procedure Laterality Date   ABDOMINAL AORTOGRAM W/LOWER EXTREMITY N/A 09/11/2021   Procedure: ABDOMINAL AORTOGRAM W/LOWER EXTREMITY;  Surgeon: Darron Deatrice LABOR, MD;  [facility]: [facility] INVASIVE CV LAB;  Service: Cardiovascular;  Laterality: N/A;   AMPUTATION Right 09/25/2021   Procedure: RIGHT TRANSMETATARSAL AMPUTATION AND APPLY TISSUE GRAFT;  Surgeon: Harden Jerona GAILS, MD;  Location: [facility] OR;  Service: Orthopedics;  Laterality: Right;   AMPUTATION Right 11/16/2021   Procedure: RIGHT AMPUTATION BELOW KNEE WITH WOUND VAC APPLICATION;  Surgeon: Harden Jerona GAILS, MD;  Location: [facility] OR;  Service: Orthopedics;  Laterality: Right;   BONE BIOPSY Right 03/06/2021   Procedure: BONE BIOPSY;  Surgeon: Gershon Donnice SAUNDERS, DPM;  Location: [facility] ORS;  Service: Podiatry;  Laterality:  Right;   CENTRAL VENOUS CATHETER INSERTION Right 01/20/2020   Procedure: INSERTION CENTRAL LINE ADULT; Tunneled central line;  Surgeon: Kallie Manuelita BROCKS, MD;  Location: [facility] ORS;  Service: General;  Laterality: Right;   COLONOSCOPY N/A 12/05/2014   DOQ:fnizmjuz external and internal hemorrhoid/mild diverticulosis/11 polyps removed   ESOPHAGOGASTRODUODENOSCOPY N/A 12/05/2014   DOQ:fpoi duodenitis   GRAFT APPLICATION Right 03/06/2021   Procedure: GRAFT APPLICATION;  Surgeon: Gershon Donnice SAUNDERS, DPM;  Location: [facility] ORS;  Service: Podiatry;  Laterality: Right;   INCISION AND DRAINAGE OF WOUND Right 08/15/2021   Procedure: IRRIGATION AND DEBRIDEMENT WOUND;  Surgeon: Burt Fus, DPM;  Location: [facility] ORS;  Service: Podiatry;  Laterality: Right;  need to make card not  a irrigation and debridement card   IR FLUORO GUIDE CV LINE RIGHT  03/01/2019   IR PERC TUN PERIT CATH WO PORT S&I /IMAG  06/27/2021   IR REMOVAL TUN CV CATH W/O FL  05/10/2019   IR REMOVAL TUN CV CATH W/O FL  02/29/2020   IR REMOVAL TUN CV CATH W/O FL  07/19/2021   IR US  GUIDE VASC ACCESS RIGHT  03/01/2019   IR US  GUIDE VASC ACCESS RIGHT  06/27/2021   IRRIGATION AND DEBRIDEMENT ELBOW Left 06/24/2021   Procedure: IRRIGATION AND DEBRIDEMENT ELBOW;  Surgeon: Leora Lynwood SAUNDERS, MD;  Location: [facility] ORS;  Service: Orthopedics;  Laterality: Left;   KIDNEY TRANSPLANT  03/27/2015   KNEE ARTHROTOMY Right 06/08/2023   Procedure: KNEE ARTHROTOMY;  Surgeon: Onesimo Oneil LABOR, MD;  Location: [facility] ORS;  Service: Orthopedics;  Laterality:  Right;   Penile Pump Insertion     PERIPHERAL VASCULAR ATHERECTOMY  09/11/2021   Procedure: PERIPHERAL VASCULAR ATHERECTOMY;  Surgeon: Darron Deatrice LABOR, MD;  Location: [facility] INV[facility] CV LAB;  Service: Cardiovascular;;  Shockwave Lithotripsy   TEE WITHOUT CARDIOVERSION N/A 01/17/2020   Procedure: TRANSESOPHAGEAL ECHOCARDIOGRAM (TEE) WITH PROPOFOL ;  Surgeon: Alvan Dorn FALCON, MD;  Location: [facility] ENDO SUITE;  Service: Endoscopy;   Laterality: N/A;   WOUND DEBRIDEMENT Right 03/06/2021   Procedure: DEBRIDEMENT WOUND;  Surgeon: Gershon Donnice SAUNDERS, DPM;  Location: [facility] ORS;  Service: Podiatry;  Laterality: Right;   Family History:   Family History  Problem Relation Age of Onset   Diabetes Mother    Heart disease Father    Colon cancer Neg Hx    Social History:  reports that he has never smoked. He has never used smokeless tobacco. He reports that he does not drink alcohol and does not use drugs. Allergies  Allergen Reactions   Bactrim  [Sulfamethoxazole -Trimethoprim ]     Runs pt's sugar and phosphorus   Doxycycline  Other (See Comments)    Runs up pt's blood sugar and phosphorus   Flagyl  [Metronidazole ] Rash    Patient having hives to either the vancomycin  or flagyl  (both infusing together).   Vancomycin  Rash    Patient having hives to either vancomycin  or flagyl    Prior to Admission medications   Medication Sig Start Date End Date Taking? Authorizing Provider  albuterol  (VENTOLIN  HFA) 108 (90 Base) MCG/ACT inhaler Inhale 2 puffs into the lungs every 6 (six) hours as needed for wheezing or shortness of breath. 05/18/23  Yes [provider]  atorvastatin  (LIPITOR) 10 MG tablet Take 1 tablet (10 mg total) by mouth daily. 05/18/23  Yes Court Dorn PARAS, MD  Azathioprine  75 MG TABS Take 2 tablets by mouth daily.   Yes [provider]  cloNIDine  (CATAPRES  - DOSED IN MG/24 HR) 0.3 mg/24hr patch Place 1 patch (0.3 mg total) onto the skin once a week. Patient taking differently: Place 0.3 mg onto the skin once a week. Place patch on Sunday 08/21/21  Yes Court Dorn PARAS, MD  clopidogrel  (PLAVIX ) 75 MG tablet Take 75 mg by mouth daily. 04/09/23  Yes [provider]  Continuous Glucose Sensor (DEXCOM G7 SENSOR) MISC USE TO CHECK GLUCOSE THREE TIMES DAILY AS DIRECTED, CHANGE SENSOR EVERY 10 DAYS. 02/03/23  Yes Thapa, Sudan, MD  doxazosin  (CARDURA ) 4 MG tablet Take 4 mg by mouth 2 (two) times daily.  02/02/23  Yes [provider]  Dulaglutide  (TRULICITY ) 3 MG/0.5ML SOPN Inject 3 mg as directed once a week. Patient taking differently: Inject 3 mg as directed once a week. Takes on Sunday 01/27/23  Yes Thapa, Sudan, MD  furosemide  (LASIX ) 20 MG tablet Take 20 mg by mouth 2 (two) times daily. 04/23/22  Yes [provider]  gabapentin  (NEURONTIN ) 300 MG capsule Take 3 capsules (900 mg total) by mouth 3 (three) times daily. 3 times a day Patient taking differently: Take 300 mg by mouth daily as needed (phantom pains/neuropathy). 3 times a day 11/22/21  Yes Valdemar Rocky SAUNDERS, NP  hydrALAZINE  (APRESOLINE ) 25 MG tablet Take 1 tablet (25 mg total) by mouth 3 (three) times daily. 02/05/22  Yes Moore, Janece, FNP  HYDROcodone -acetaminophen  (NORCO/VICODIN) 5-325 MG tablet Take 1 tablet by mouth every 6 (six) hours as needed. Patient taking differently: Take 1 tablet by mouth every 6 (six) hours as needed for moderate pain (pain score 4-6). 05/03/23  Yes Schutt, Marsa HERO,  PA-C  insulin  glargine (LANTUS ) 100 UNIT/ML injection Inject 30 Units into the skin at bedtime. 03/31/23 06/29/23 Yes [provider]  latanoprost  (XALATAN ) 0.005 % ophthalmic solution Place 1 drop into both eyes at bedtime. 07/25/21  Yes [provider]  Multiple Vitamin (MULTIVITAMIN) capsule Take 1 capsule by mouth daily.   Yes [provider]  mycophenolate  (MYFORTIC ) 360 MG TBEC EC tablet Take 720 mg by mouth 2 (two) times daily. 03/19/23  Yes [provider]  NOVOLIN  N FLEXPEN 100 UNIT/ML FlexPen INJECT 30 UNITS SUBCUTANEOUSLY IN THE MORNING AND 20 UNITS with SUPPER. Patient taking differently: Inject 30 Units into the skin 2 (two) times daily before a meal. Takes 30 UNITS SUBCUTANEOUSLY IN THE MORNING with breakfast AND 30 UNITS with SUPPER. 01/27/23  Yes Thapa, Sudan, MD  omeprazole  (PRILOSEC) 40 MG capsule Take 40 mg by mouth every morning. 03/31/23  Yes [provider]  tacrolimus   (PROGRAF ) 1 MG capsule Take 4 capsules (4 mg total) by mouth 2 (two) times daily. 08/30/21  Yes Rai, Ripudeep K, MD  Vitamin D , Ergocalciferol , (DRISDOL ) 50000 units CAPS capsule Take 50,000 Units by mouth See admin instructions. 15th of the month   Yes [provider]  blood glucose meter kit and supplies KIT Dispense based on patient and insurance preference. Use up to four times daily as directed. (FOR ICD-9 250.00, 250.01). 04/11/20   [name], Gebreselassie W, MD  Blood Glucose Monitoring Suppl (ACCU-CHEK GUIDE ME) w/Device KIT 1 Piece by Does not apply route as directed. 02/18/18   [name], Gebreselassie W, MD  Continuous Blood Gluc Receiver (DEXCOM G7 RECEIVER) DEVI Use to check blood sugar 3 times a day. Dx code e11.65 12/16/21   Georgina Speaks, FNP  glucose blood (ACCU-CHEK GUIDE) test strip Use as instructed 4 x daily. E11.65 02/19/18   Lenis Ethelle ORN, MD   Current Facility-Administered Medications  Medication Dose Route Frequency Provider Last Rate Last Admin   acetaminophen  (TYLENOL ) tablet 650 mg  650 mg Oral Q6H PRN Onesimo Oneil LABOR, MD       Or   acetaminophen  (TYLENOL ) suppository 650 mg  650 mg Rectal Q6H PRN Onesimo Oneil LABOR, MD       atorvastatin  (LIPITOR) tablet 10 mg  10 mg Oral Daily Onesimo Oneil A, MD   10 mg at 06/09/23 0900   azaTHIOprine  (IMURAN ) tablet 150 mg  150 mg Oral Daily Onesimo Oneil LABOR, MD       brimonidine  (ALPHAGAN ) 0.2 % ophthalmic solution 1 drop  1 drop Both Eyes TID Onesimo Oneil LABOR, MD   1 drop at 06/09/23 0909   Chlorhexidine  Gluconate Cloth 2 % PADS 6 each  6 each Topical Q0600 Rayburn Pac, MD       [START ON 06/14/2023] cloNIDine  (CATAPRES  - Dosed in mg/24 hr) patch 0.3 mg  0.3 mg Transdermal Weekly Onesimo Oneil LABOR, MD       dorzolamide -timolol  (COSOPT ) 2-0.5 % ophthalmic solution 1 drop  1 drop Both Eyes BID Onesimo Oneil LABOR, MD   1 drop at 06/09/23 9090   furosemide  (LASIX ) tablet 20 mg  20 mg Oral BID Onesimo Oneil LABOR, MD   20 mg at 06/09/23 0900    gabapentin  (NEURONTIN ) capsule 900 mg  900 mg Oral TID Onesimo Oneil LABOR, MD   900 mg at 06/09/23 0900   hydrALAZINE  (APRESOLINE ) tablet 25 mg  25 mg Oral TID Onesimo Oneil LABOR, MD   25 mg at 06/09/23 0901   HYDROmorphone  (DILAUDID ) injection  0.5 mg  0.5 mg Intravenous Q3H PRN Onesimo Oneil LABOR, MD   0.5 mg at 06/09/23 9090   insulin  aspart (novoLOG ) injection 0-15 Units  0-15 Units Subcutaneous TID WC Onesimo Oneil LABOR, MD   5 Units at 06/09/23 0904   insulin  NPH Human (NOVOLIN  N) injection 30 Units  30 Units Subcutaneous QAC breakfast Cherlyn Labella, MD   30 Units at 06/09/23 0904   And   insulin  NPH Human (NOVOLIN  N) injection 30 Units  30 Units Subcutaneous QHS Cherlyn Labella, MD   30 Units at 06/08/23 2340   latanoprost  (XALATAN ) 0.005 % ophthalmic solution 1 drop  1 drop Both Eyes QHS Onesimo Oneil LABOR, MD   1 drop at 06/08/23 2238   mupirocin  ointment (BACTROBAN ) 2 % 1 Application  1 Application Topical BID Onesimo Oneil LABOR, MD       mycophenolate  (MYFORTIC ) EC tablet 720 mg  720 mg Oral BID Onesimo Oneil LABOR, MD   720 mg at 06/08/23 2339   ondansetron  (ZOFRAN ) tablet 4 mg  4 mg Oral Q6H PRN Onesimo Oneil LABOR, MD       Or   ondansetron  (ZOFRAN ) injection 4 mg  4 mg Intravenous Q6H PRN Onesimo Oneil LABOR, MD   4 mg at 06/08/23 1808   oxyCODONE -acetaminophen  (PERCOCET/ROXICET) 5-325 MG per tablet 1-2 tablet  1-2 tablet Oral Q4H PRN Onesimo Oneil LABOR, MD   1 tablet at 06/09/23 0541   sodium bicarbonate  tablet 1,300 mg  1,300 mg Oral TID Onesimo Oneil LABOR, MD   1,300 mg at 06/09/23 9140   tacrolimus  (PROGRAF ) capsule 4 mg  4 mg Oral BID Onesimo Oneil LABOR, MD       vancomycin  (VANCOCIN ) IVPB 1000 mg/200 mL premix  1,000 mg Intravenous Q T,Th,Sa-HD Onesimo Oneil LABOR, MD       Labs: Basic Metabolic Panel: Recent Labs  Lab 06/07/23 2002 06/08/23 0505  NA 136 135  K 4.0 4.0  CL 101 99  CO2 26 26  GLUCOSE 93 133*  BUN 40* 43*  CREATININE 3.51* 4.06*  CALCIUM  8.9 8.8*  PHOS  --  5.7*   Liver Function Tests: Recent Labs  Lab  06/08/23 0505  AST 11*  ALT 14  ALKPHOS 74  BILITOT 0.4  PROT 7.8  ALBUMIN  2.2*   No results for input(s): LIPASE, AMYLASE in the last 168 hours. No results for input(s): AMMONIA in the last 168 hours. CBC: Recent Labs  Lab 06/07/23 2002 06/08/23 0505  WBC 6.0 5.6  HGB 9.1* 8.5*  HCT 32.8* 31.1*  MCV 83.0 83.6  PLT 262 270   Cardiac Enzymes: No results for input(s): CKTOTAL, CKMB, CKMBINDEX, TROPONINI in the last 168 hours. CBG: Recent Labs  Lab 06/08/23 1549 06/08/23 1839 06/08/23 2008 06/09/23 0734 06/09/23 1003  GLUCAP 118* 118* 148* 207* 194*   Iron Studies: No results for input(s): IRON, TIBC, TRANSFERRIN, FERRITIN in the last 72 hours. Studies/Results: DG Knee Complete 4 Views Right Result Date: 06/07/2023 CLINICAL DATA:  Right knee pain EXAM: RIGHT KNEE - COMPLETE 4+ VIEW COMPARISON:  None Available. FINDINGS: Status post below knee amputation. No fracture or dislocation is seen. Mild tricompartmental degenerative changes, most prominent at the patellofemoral compartment. Moderate suprapatellar knee joint effusion. Vascular calcifications. IMPRESSION: Status post below knee amputation. No fracture or dislocation is seen. Electronically Signed   By: Pinkie Pebbles M.D.   On: 06/07/2023 19:20    ROS: Pertinent items are noted in HPI. Physical Exam: Vitals:   06/08/23  1845 06/08/23 1900 06/09/23 0405 06/09/23 0735  BP: (!) 185/97 (!) 163/90 130/84 132/65  Pulse: 98 97 70 73  Resp: 15 14 16 16   Temp:  98.9 F (37.2 C) 98 F (36.7 C)   TempSrc:  Oral Oral   SpO2: 95% 96%  94%  Weight:      Height:          Weight change: 0 kg  Intake/Output Summary (Last 24 hours) at 06/09/2023 1054 Last data filed at 06/08/2023 1828 Gross per 24 hour  Intake 700 ml  Output 170 ml  Net 530 ml   BP 132/65 (BP Location: Right Arm)   Pulse 73   Temp 98 F (36.7 C) (Oral)   Resp 16   Ht 5' 9 (1.753 m)   Wt 97.5 kg   SpO2 94%   BMI 31.74  kg/m  General appearance: alert, cooperative, and no distress Head: Normocephalic, without obvious abnormality, atraumatic Resp: clear to auscultation bilaterally Cardio: regular rate and rhythm, S1, S2 normal, no murmur, click, rub or gallop GI: soft, non-tender; bowel sounds normal; no masses,  no organomegaly Extremities: s/p RBKA, leg wrapped Dialysis Access: LUE AVF +T/B  Dialysis Orders: Center:  [facility]  on  TTS . EDW 96.5 kg HD Bath 2K/2.5Ca  Time 3 hours Heparin   2000 unit IVP then 100 units/hr. Access LUE AVF BFR 300  DFR 600      Assessment/Plan:  Septic arthritis of right knee - s/p I&D.  Ortho following.  Currently on IV vancomycin  per primary  ESRD -  continue with HD on TTS schedule while he remains an inpatient.  Hypertension/volume  -  stable and UF as tolerated.  Only about 1 kg above edw.  Anemia  - follow hgb  Metabolic bone disease -  continue with home meds  Nutrition -  renal diet, carb modified.  S/p DDKT - failing and resumed HD.  Ok to continue with immunosuppressive agents for now.  Followed by [place].  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  Fairy RONAL Sellar, MD [facility], [facility] Pager [phone] 06/09/2023, 10:54 AM

## 2023-06-09 NOTE — Progress Notes (Signed)
   ORTHOPAEDIC PROGRESS NOTE  s/p Procedure(s): KNEE ARTHROTOMY for right knee septic arthritis  DOS: 06/08/2023  SUBJECTIVE: No issues overnight.  Pain in his right knee is much better.  No fevers or chills.  He is asking when he can go home.  OBJECTIVE: PE:  Alert and oriented.  No acute distress.  Surgical dressing on the right leg, extending to his BKA stump is clean, dry and intact.  He is able to flex and extend the right knee.  He does have some general tenderness to palpation.  Good blood flow to the distal stump.  Vitals:   06/09/23 0405 06/09/23 0735  BP: 130/84 132/65  Pulse: 70 73  Resp: 16 16  Temp: 98 F (36.7 C)   SpO2:  94%      Latest Ref Rng & Units 06/08/2023    5:05 AM 06/07/2023    8:02 PM 05/03/2023    3:07 PM  CBC  WBC 4.0 - 10.5 K/uL 5.6  6.0  6.4   Hemoglobin 13.0 - 17.0 g/dL 8.5  9.1  9.2   Hematocrit 39.0 - 52.0 % 31.1  32.8  33.4   Platelets 150 - 400 K/uL 270  262  208      ASSESSMENT: Raymond [name] is a 63 y.o. male doing well postoperatively.  His pain is much better.  He remained stable.  PLAN: Weightbearing: WBAT RLE Incisional and dressing care: Reinforce dressings as needed Orthopedic device(s): None VTE prophylaxis: Recommend 81 mg of aspirin , twice daily.  No contraindications from an orthopedic standpoint. Pain control: As needed Follow - up plan: 2 weeks  Cultures were obtained with a knee aspiration, as well as IntraOp.  No culture data is available at this point.  Continue with IV antibiotics.  Okay to transition to oral antibiotics.  He will weight-bear as tolerated.  He can work with physical therapy.  He may have some discomfort wearing the prosthesis following surgery.  I would like see him back in clinic in approximately 10-14 days.   Contact information:     Sekai Nayak A. Onesimo, MD MS [facility] [address] [place],  [place]  [address] Phone: [phone] Fax: [phone]

## 2023-06-10 ENCOUNTER — Inpatient Hospital Stay (HOSPITAL_COMMUNITY): Payer: BLUE CROSS/BLUE SHIELD

## 2023-06-10 LAB — CBC
HCT: 30.1 % — ABNORMAL LOW (ref 39.0–52.0)
Hemoglobin: 8.4 g/dL — ABNORMAL LOW (ref 13.0–17.0)
MCH: 23.2 pg — ABNORMAL LOW (ref 26.0–34.0)
MCHC: 27.9 g/dL — ABNORMAL LOW (ref 30.0–36.0)
MCV: 83.1 fL (ref 80.0–100.0)
Platelets: 323 10*3/uL (ref 150–400)
RBC: 3.62 MIL/uL — ABNORMAL LOW (ref 4.22–5.81)
RDW: 17.1 % — ABNORMAL HIGH (ref 11.5–15.5)
WBC: 8 10*3/uL (ref 4.0–10.5)
nRBC: 0 % (ref 0.0–0.2)

## 2023-06-10 LAB — GLUCOSE, CAPILLARY
Glucose-Capillary: 136 mg/dL — ABNORMAL HIGH (ref 70–99)
Glucose-Capillary: 163 mg/dL — ABNORMAL HIGH (ref 70–99)
Glucose-Capillary: 169 mg/dL — ABNORMAL HIGH (ref 70–99)
Glucose-Capillary: 175 mg/dL — ABNORMAL HIGH (ref 70–99)
Glucose-Capillary: 98 mg/dL (ref 70–99)

## 2023-06-10 LAB — CBC WITH DIFFERENTIAL/PLATELET
Abs Immature Granulocytes: 0.04 10*3/uL (ref 0.00–0.07)
Basophils Absolute: 0 10*3/uL (ref 0.0–0.1)
Basophils Relative: 0 %
Eosinophils Absolute: 0.2 10*3/uL (ref 0.0–0.5)
Eosinophils Relative: 3 %
HCT: 29.7 % — ABNORMAL LOW (ref 39.0–52.0)
Hemoglobin: 8.1 g/dL — ABNORMAL LOW (ref 13.0–17.0)
Immature Granulocytes: 1 %
Lymphocytes Relative: 25 %
Lymphs Abs: 1.5 10*3/uL (ref 0.7–4.0)
MCH: 23.1 pg — ABNORMAL LOW (ref 26.0–34.0)
MCHC: 27.3 g/dL — ABNORMAL LOW (ref 30.0–36.0)
MCV: 84.6 fL (ref 80.0–100.0)
Monocytes Absolute: 0.7 10*3/uL (ref 0.1–1.0)
Monocytes Relative: 11 %
Neutro Abs: 3.8 10*3/uL (ref 1.7–7.7)
Neutrophils Relative %: 60 %
Platelets: 322 10*3/uL (ref 150–400)
RBC: 3.51 MIL/uL — ABNORMAL LOW (ref 4.22–5.81)
RDW: 17.2 % — ABNORMAL HIGH (ref 11.5–15.5)
WBC: 6.2 10*3/uL (ref 4.0–10.5)
nRBC: 0 % (ref 0.0–0.2)

## 2023-06-10 LAB — BASIC METABOLIC PANEL
Anion gap: 12 (ref 5–15)
Anion gap: 11 (ref 5–15)
BUN: 70 mg/dL — ABNORMAL HIGH (ref 8–23)
BUN: 47 mg/dL — ABNORMAL HIGH (ref 8–23)
CO2: 25 mmol/L (ref 22–32)
CO2: 25 mmol/L (ref 22–32)
Calcium: 8.9 mg/dL (ref 8.9–10.3)
Calcium: 8.4 mg/dL — ABNORMAL LOW (ref 8.9–10.3)
Chloride: 100 mmol/L (ref 98–111)
Chloride: 98 mmol/L (ref 98–111)
Creatinine, Ser: 5.61 mg/dL — ABNORMAL HIGH (ref 0.61–1.24)
Creatinine, Ser: 4.22 mg/dL — ABNORMAL HIGH (ref 0.61–1.24)
GFR, Estimated: 11 mL/min — ABNORMAL LOW (ref >60)
GFR, Estimated: 15 mL/min — ABNORMAL LOW (ref >60)
Glucose, Bld: 159 mg/dL — ABNORMAL HIGH (ref 70–99)
Glucose, Bld: 149 mg/dL — ABNORMAL HIGH (ref 70–99)
Potassium: 4.8 mmol/L (ref 3.5–5.1)
Potassium: 4.3 mmol/L (ref 3.5–5.1)
Sodium: 137 mmol/L (ref 135–145)
Sodium: 134 mmol/L — ABNORMAL LOW (ref 135–145)

## 2023-06-10 LAB — MAGNESIUM: Magnesium: 1.8 mg/dL (ref 1.7–2.4)

## 2023-06-10 LAB — BLOOD GAS, VENOUS
Acid-Base Excess: 4.1 mmol/L — ABNORMAL HIGH (ref 0.0–2.0)
Bicarbonate: 28.5 mmol/L — ABNORMAL HIGH (ref 20.0–28.0)
Drawn by: 18061
O2 Saturation: 95.1 %
Patient temperature: 37.8
pCO2, Ven: 43 mm[Hg] — ABNORMAL LOW (ref 44–60)
pH, Ven: 7.44 — ABNORMAL HIGH (ref 7.25–7.43)
pO2, Ven: 76 mm[Hg] — ABNORMAL HIGH (ref 32–45)

## 2023-06-10 LAB — PHOSPHORUS: Phosphorus: 4.9 mg/dL — ABNORMAL HIGH (ref 2.5–4.6)

## 2023-06-10 LAB — LACTIC ACID, PLASMA: Lactic Acid, Venous: 0.7 mmol/L (ref 0.5–1.9)

## 2023-06-10 LAB — HEPATITIS B SURFACE ANTIGEN: Hepatitis B Surface Ag: NONREACTIVE

## 2023-06-10 MED ORDER — NEPRO/CARBSTEADY PO LIQD: 237.0000 mL | ORAL | Status: DC | PRN

## 2023-06-10 MED ORDER — SODIUM CHLORIDE 0.9 % IV SOLN: 2.0000 g | INTRAVENOUS | Status: DC

## 2023-06-10 MED ORDER — GABAPENTIN 300 MG PO CAPS: 300.0000 mg | ORAL_CAPSULE | Freq: Three times a day (TID) | ORAL | Status: DC | PRN

## 2023-06-10 MED ORDER — LIDOCAINE HCL (PF) 1 % IJ SOLN: 5.0000 mL | INTRAMUSCULAR | Status: DC | PRN

## 2023-06-10 MED ORDER — PENTAFLUOROPROP-TETRAFLUOROETH EX AERO: 1.0000 | INHALATION_SPRAY | CUTANEOUS | Status: DC | PRN

## 2023-06-10 MED ORDER — MYCOPHENOLATE MOFETIL 250 MG PO CAPS
1000.0000 mg | ORAL_CAPSULE | Freq: Two times a day (BID) | ORAL | Status: DC
  Administered 2023-06-11: 1000 mg via ORAL
  Filled 2023-06-10 (×5): qty 4

## 2023-06-10 MED ORDER — VANCOMYCIN HCL IN DEXTROSE 1-5 GM/200ML-% IV SOLN: 1000.0000 mg | INTRAVENOUS | Status: DC

## 2023-06-10 MED ORDER — TACROLIMUS 1 MG PO CAPS
1.0000 mg | ORAL_CAPSULE | Freq: Two times a day (BID) | ORAL | Status: DC
  Administered 2023-06-10: 1 mg via ORAL
  Filled 2023-06-10 (×4): qty 1

## 2023-06-10 MED ORDER — PREDNISONE 10 MG PO TABS: 10.0000 mg | ORAL_TABLET | Freq: Two times a day (BID) | ORAL | Status: DC

## 2023-06-10 MED ORDER — LIDOCAINE-PRILOCAINE 2.5-2.5 % EX CREA: 1.0000 | TOPICAL_CREAM | CUTANEOUS | Status: DC | PRN

## 2023-06-10 NOTE — Progress Notes (Signed)
 Physical Therapy Treatment Patient Details Name: Raymond [name] MRN: [id number] DOB: 02-23-1961 Today's Date: 06/10/2023   History of Present Illness Raymond [name] is a 63 y.o. male with medical history significant of hypertension, ESRD on HD (TTS) status post kidney transplant (03/09/2015) and now back on dialysis, T2DM, hypertension, hyperlipidemia, right BKA who presents to the emergency department due to 1 month history of right knee pain that has progressively worsened.  Patient complained of worsening pain today, he complained of an aching pressure and throbbing pain of the right knee, so he decided to go to the ED for further evaluation and management.    PT Comments  Pt able to complete bed mobility and transfer to bench in chair with supervision only.  Increased time with bed mobility with labored movements but no assistance required.  Educated techniques to address edema for pain control.  EOS pt left sitting on bench with call bell within reach.      If plan is discharge home, recommend the following:     Can travel by private vehicle        Equipment Recommendations       Recommendations for Other Services       Precautions / Restrictions Precautions Precautions: Fall Required Braces or Orthoses: Other Brace Other Brace: right prosthetic- Restrictions Weight Bearing Restrictions Per Provider Order: No     Mobility  Bed Mobility Overal bed mobility: Modified Independent       Supine to sit: Supervision     General bed mobility comments: Able to complete bed mobility I, increased time slow labored movements    Transfers Overall transfer level: Independent                 General transfer comment: Pt able to demonstrate safe SPC from bed to bench in room.    Ambulation/Gait                   Stairs             Wheelchair Mobility     Tilt Bed    Modified Rankin (Stroke Patients Only)       Balance                                             Cognition Arousal: Alert Behavior During Therapy: WFL for tasks assessed/performed Overall Cognitive Status: Within Functional Limits for tasks assessed                                          Exercises      General Comments        Pertinent Vitals/Pain Pain Assessment Pain Assessment: 0-10 Pain Score: 8  Pain Location: Right knee Pain Descriptors / Indicators: Sore Pain Intervention(s): Monitored during session, Limited activity within patient's tolerance, Repositioned    Home Living                          Prior Function            PT Goals (current goals can now be found in the care plan section)      Frequency           PT Plan  Co-evaluation              AM-PAC PT "6 Clicks" Mobility   Outcome Measure  Help needed turning from your back to your side while in a flat bed without using bedrails?: A Little Help needed moving from lying on your back to sitting on the side of a flat bed without using bedrails?: A Little Help needed moving to and from a bed to a chair (including a wheelchair)?: A Little Help needed standing up from a chair using your arms (e.g., wheelchair or bedside chair)?: A Little Help needed to walk in hospital room?: A Lot Help needed climbing 3-5 steps with a railing? : A Lot 6 Click Score: 16    End of Session   Activity Tolerance: Patient tolerated treatment well Patient left: in chair;with call bell/phone within reach Nurse Communication: Mobility status       Time: 8657-8469 PT Time Calculation (min) (ACUTE ONLY): 12 min  Charges:    $Therapeutic Activity: 8-22 mins PT General Charges $$ ACUTE PT VISIT: 1 Visit                    Minor Amble, LPTA/CLT; CBIS [phone]  Alesia Anchors 06/10/2023, 1:18 PM

## 2023-06-10 NOTE — Progress Notes (Signed)
 OT Cancellation Note  Patient Details Name: Raymond [name] MRN: [id number] DOB: [birth year]   Cancelled Treatment:    Reason Eval/Treat Not Completed: OT screened, no needs identified, will sign off. Pt demonstrates WFL B UE strength and has no concerns about ability to complete ADL's. Able to scoot laterally on the bed well. Deferring to PT for transfers. Pt will be removed from the OT list.   Thurnell Floss OT, MOT   Thurnell Floss 06/10/2023, 11:39 AM

## 2023-06-10 NOTE — Progress Notes (Signed)
 Rapid Response Event Note   Reason for Call :  Dialysis RN called rapid r/t patient waking up in treatment with change in mental status.   Initial Focused Assessment:   Pt alert and oriented per dialysis RN prior to treatment. She reports he had been resting during most of his treatment and he woke up altered with a fix gazed and confused speech. Upon arrival of this RN other staff including MD, [facility], RT, and ICU Charge were already at bedside and report patient being more alert. MD did full Neuro on patient and declined calling a Code stroke.  Pt VS: 89 HR, 126/93 BP, 100% RA, 110.1 temp, 17 RR.  CBG 115   Interventions:   Labs ordered, Lab at bedside obtaining  Patient moved to stepdown for closer monitoring.   Event Summary:   MD Notified: 2017 Call Time: 2017 Arrival Time:2019 End Time: 2050  Donald Frost, RN

## 2023-06-10 NOTE — Procedures (Signed)
   HEMODIALYSIS TREATMENT NOTE:  Pt arrived to [facility] from [facility].  Pre-HD:  A/O x 4, t98, BP 122/85 (98), HR 73, R18, spO2 98% on RA.  Orders / HD plan discussed with pt prior to obtaining consent for HD.  He tells me "they're still trying to save this [deceased donor transplanted] kidney."  He confirmed he does not usually have problems with BP dropping during HD, reports no issues with AVF.  Heparin -free tx was initiated using left upper arm AVF (15g/antegrade).  Hemodynamically stable, resting with eyes closed for most of session.  BFR was lowered due to rising VP, no evidence of infiltration.  Needle placement was verified.    Two hours into session, Tablo detected air in venous blood line.  Chamber had clotted despite q69m scheduled NS flushes.  Unable to return blood - EBL 300 ml.  Hgb 8.1 this morning.  While new cartridge/dialyzer was being set up (between 2006 and 2010) pt awoke with change in mental status.  He was disoriented x4 and could not follow simple commands.  He could speak, chuckled and agreed that "something is wrong" although he denied specific complaints.  BP 139/96, HR 78, spO2 98%.  Glucose 115.  He would make and keep eye contact, track, able to move extremities x4.  Rapid Response was called with immediate arrival of team.  Dr. Amy Kansky performed bedside assessment.  Pt was, by then, able to follow simple commands but still could not answer orientation questions correctly.  Labs were ordered.  Code stroke was not called.  Dr. Segars recommended stopping HD at this point.    Pt's wife was brought to the [facility] and confirmed "something's off" after pt recognized her, remembering that she was to bring him [place] take-out, but could not recall what he had requested to eat.   Labs were collected from AVF prior to needle removal and pt was transported to SDU for closer monitoring.  On-call nephrologist Dr. Edson Graces was notified of above events.   Total run time: 2 hours Net UF 700  ml   Waunita Haff, RN [facility]

## 2023-06-10 NOTE — Progress Notes (Addendum)
 [facility]  PROGRESS NOTE  Raymond [name] ZOX:[id number] DOB: 03-23-1961 DOA: 06/07/2023 PCP: [name], FNP   Brief HPI:   63 y.o. male with medical history significant of hypertension, ESRD on HD (TTS) status post kidney transplant (03/09/2015) and now back on dialysis, T2DM, hypertension, hyperlipidemia, right BKA who presents to the emergency department due to 1 month history of right knee pain that has progressively worsened , and he came to ED for further evaluation.       Assessment/Plan:   Right knee pain from possible septic arthritis:  Synovial fluid analysis shows wbc count of 81,450. Neutrophils show 96%. Fluid cultures pending.  Orthopedics consulted, and  underwent  arthrotomy for septic arthritis.  He was started on IV vancomycin  and IV cefepime  added.  Cultures are pending, not ready for discharge yet. Dicussed with Dr Zelda Hickman with ID,  recommended to wait for the cultures.  Pain control with oxycodone  and dilaudid .        ESRD on HD  S/p kidney transplant in 2016. TTS  schedule.  Continue with  mycophenolate , Tacrolimus .  Patient said that he is not taking azathioprine , will discontinue azathioprine  at this time.   Type 2 Diabetes mellitus:  CBG (last 3)   Resume SSI.  CBG well-controlled     Hypertension:  Well controlled.      Hyperlipidemia Resume lipitor 10 mg daily.     Medications     atorvastatin   10 mg Oral Daily   azaTHIOprine   150 mg Oral Daily   brimonidine   1 drop Both Eyes TID   Chlorhexidine  Gluconate Cloth  6 each Topical Q0600   [START ON 06/14/2023] cloNIDine   0.3 mg Transdermal Weekly   dorzolamide -timolol   1 drop Both Eyes BID   furosemide   20 mg Oral BID   hydrALAZINE   25 mg Oral TID   insulin  aspart  0-15 Units Subcutaneous TID WC   insulin  glargine-yfgn  15 Units Subcutaneous Daily   latanoprost   1 drop Both Eyes QHS   mupirocin  ointment  1 Application Topical BID   mycophenolate   720 mg Oral BID    sodium bicarbonate   1,300 mg Oral TID   tacrolimus   4 mg Oral BID     Data Reviewed:   CBG:  Recent Labs  Lab 06/09/23 1720 06/09/23 2205 06/09/23 2335 06/10/23 0410 06/10/23 0721  GLUCAP 72 310* 202* 163* 136*    SpO2: 96 % O2 Flow Rate (L/min): 0 L/min FiO2 (%): 21 %    Vitals:   06/09/23 0405 06/09/23 0735 06/09/23 2208 06/10/23 0408  BP: 130/84 132/65 128/78 (!) 150/81  Pulse: 70 73 72 73  Resp: 16 16 20 20   Temp: 98 F (36.7 C)  98.6 F (37 C) 98.9 F (37.2 C)  TempSrc: Oral  Oral   SpO2:  94% 96% 96%  Weight:      Height:          Data Reviewed:  Basic Metabolic Panel: Recent Labs  Lab 06/07/23 2002 06/08/23 0505 06/10/23 0525  NA 136 135 137  K 4.0 4.0 4.8  CL 101 99 100  CO2 26 26 25   GLUCOSE 93 133* 159*  BUN 40* 43* 70*  CREATININE 3.51* 4.06* 5.61*  CALCIUM  8.9 8.8* 8.9  MG  --  2.1  --   PHOS  --  5.7*  --     CBC: Recent Labs  Lab 06/07/23 2002 06/08/23 0505 06/10/23 0525  WBC 6.0 5.6 6.2  NEUTROABS  --   --  3.8  HGB 9.1* 8.5* 8.1*  HCT 32.8* 31.1* 29.7*  MCV 83.0 83.6 84.6  PLT 262 270 322    LFT Recent Labs  Lab 06/08/23 0505  AST 11*  ALT 14  ALKPHOS 74  BILITOT 0.4  PROT 7.8  ALBUMIN  2.2*     Antibiotics: Anti-infectives (From admission, onward)    Start     Dose/Rate Route Frequency Ordered Stop   06/09/23 1600  ceFEPIme  (MAXIPIME ) 2 g in sodium chloride  0.9 % 100 mL IVPB        2 g 200 mL/hr over 30 Minutes Intravenous Every T-Th-Sa (Hemodialysis) 06/09/23 1210     06/09/23 1200  vancomycin  (VANCOCIN ) IVPB 1000 mg/200 mL premix        1,000 mg 200 mL/hr over 60 Minutes Intravenous Every T-Th-Sa (Hemodialysis) 06/08/23 0915     06/08/23 0730  vancomycin  variable dose per unstable renal function (pharmacist dosing)  Status:  Discontinued         Does not apply See admin instructions 06/08/23 0730 06/08/23 0916   06/08/23 0045  vancomycin  (VANCOCIN ) IVPB 1000 mg/200 mL premix       Placed in "Followed  by" Linked Group   1,000 mg 200 mL/hr over 60 Minutes Intravenous  Once 06/07/23 2318 06/08/23 0301   06/07/23 2315  vancomycin  (VANCOCIN ) IVPB 1000 mg/200 mL premix       Placed in "Followed by" Linked Group   1,000 mg 200 mL/hr over 60 Minutes Intravenous  Once 06/07/23 2318 06/08/23 0057        DVT prophylaxis: SCDs  Code Status: Full code  Family Communication: No family at bedside   CONSULTS orthopedics   Subjective   Patient says that pain is controlled.   Objective    Physical Examination:   General-appears in no acute distress Heart-S1-S2, regular, no murmur auscultated Lungs-clear to auscultation bilaterally, no wheezing or crackles auscultated Abdomen-soft, nontender, no organomegaly Extremities-no edema in the lower extremities Neuro-alert, oriented x3, no focal deficit noted  Status is: Inpatient: SAR           Raymond [name] S Raymond [name]   [facility] If 7PM-7AM, please contact night-coverage at [email or web address], Office  [phone]   06/10/2023, 8:50 AM  LOS: 3 days

## 2023-06-10 NOTE — Plan of Care (Signed)

## 2023-06-10 NOTE — Progress Notes (Signed)
 Patient ID: Raymond [name], male   DOB: [birth year], 63 y.o.   MRN: [id number] S: no new complaints. O:BP (!) 150/81 (BP Location: Right Arm)   Pulse 73   Temp 98.9 F (37.2 C)   Resp 20   Ht 5\' 9"  (1.753 m)   Wt 97.5 kg   SpO2 96%   BMI 31.74 kg/m   Intake/Output Summary (Last 24 hours) at 06/10/2023 4098 Last data filed at 06/10/2023 0300 Gross per 24 hour  Intake 1020 ml  Output --  Net 1020 ml   Intake/Output: I/O last 3 completed shifts: In: 1020 [P.O.:720; IV Piggyback:300] Out: -   Intake/Output this shift:  No intake/output data recorded. Weight change:  Gen: NAD CVS: RRR Resp:CTA Abd: +BS, soft, TN/ND Ext: s/p RBKA, right leg wrapped, LUE AVF +T/B  Recent Labs  Lab 06/07/23 2002 06/08/23 0505 06/10/23 0525  NA 136 135 137  K 4.0 4.0 4.8  CL 101 99 100  CO2 26 26 25   GLUCOSE 93 133* 159*  BUN 40* 43* 70*  CREATININE 3.51* 4.06* 5.61*  ALBUMIN   --  2.2*  --   CALCIUM  8.9 8.8* 8.9  PHOS  --  5.7*  --   AST  --  11*  --   ALT  --  14  --    Liver Function Tests: Recent Labs  Lab 06/08/23 0505  AST 11*  ALT 14  ALKPHOS 74  BILITOT 0.4  PROT 7.8  ALBUMIN  2.2*   No results for input(s): "LIPASE", "AMYLASE" in the last 168 hours. No results for input(s): "AMMONIA" in the last 168 hours. CBC: Recent Labs  Lab 06/07/23 2002 06/08/23 0505 06/10/23 0525  WBC 6.0 5.6 6.2  NEUTROABS  --   --  3.8  HGB 9.1* 8.5* 8.1*  HCT 32.8* 31.1* 29.7*  MCV 83.0 83.6 84.6  PLT 262 270 322   Cardiac Enzymes: No results for input(s): "CKTOTAL", "CKMB", "CKMBINDEX", "TROPONINI" in the last 168 hours. CBG: Recent Labs  Lab 06/09/23 1720 06/09/23 2205 06/09/23 2335 06/10/23 0410 06/10/23 0721  GLUCAP 72 310* 202* 163* 136*    Iron Studies: No results for input(s): "IRON", "TIBC", "TRANSFERRIN", "FERRITIN" in the last 72 hours. Studies/Results: No results found.  atorvastatin   10 mg Oral Daily   azaTHIOprine   150 mg Oral Daily   brimonidine   1  drop Both Eyes TID   Chlorhexidine  Gluconate Cloth  6 each Topical Q0600   [START ON 06/14/2023] cloNIDine   0.3 mg Transdermal Weekly   dorzolamide -timolol   1 drop Both Eyes BID   furosemide   20 mg Oral BID   hydrALAZINE   25 mg Oral TID   insulin  aspart  0-15 Units Subcutaneous TID WC   insulin  glargine-yfgn  15 Units Subcutaneous Daily   latanoprost   1 drop Both Eyes QHS   mupirocin  ointment  1 Application Topical BID   mycophenolate   720 mg Oral BID   sodium bicarbonate   1,300 mg Oral TID   tacrolimus   4 mg Oral BID    BMET    Component Value Date/Time   NA 137 06/10/2023 0525   NA 142 02/05/2022 1023   NA 142 03/13/2014 1049   K 4.8 06/10/2023 0525   K 4.1 03/13/2014 1049   CL 100 06/10/2023 0525   CL 101 03/13/2014 1049   CO2 25 06/10/2023 0525   CO2 30 03/13/2014 1049   GLUCOSE 159 (H) 06/10/2023 0525   GLUCOSE 217 (H) 03/13/2014 1049   BUN 70 (  H) 06/10/2023 0525   BUN 30 (H) 02/05/2022 1023   BUN 45 (H) 03/13/2014 1049   CREATININE 5.61 (H) 06/10/2023 0525   CREATININE 1.30 03/26/2021 1513   CALCIUM  8.9 06/10/2023 0525   CALCIUM  8.2 (L) 03/13/2014 1049   GFRNONAA 11 (L) 06/10/2023 0525   GFRNONAA 6 (L) 03/13/2014 1049   GFRNONAA 8 (L) 09/07/2013 1435   GFRAA >60 01/20/2020 0510   GFRAA 7 (L) 03/13/2014 1049   GFRAA 9 (L) 09/07/2013 1435   CBC    Component Value Date/Time   WBC 6.2 06/10/2023 0525   RBC 3.51 (L) 06/10/2023 0525   HGB 8.1 (L) 06/10/2023 0525   HGB 9.9 (L) 08/21/2021 1009   HCT 29.7 (L) 06/10/2023 0525   HCT 33.8 (L) 08/21/2021 1009   PLT 322 06/10/2023 0525   PLT 287 08/21/2021 1009   MCV 84.6 06/10/2023 0525   MCV 84 08/21/2021 1009   MCV 87 03/13/2014 1049   MCH 23.1 (L) 06/10/2023 0525   MCHC 27.3 (L) 06/10/2023 0525   RDW 17.2 (H) 06/10/2023 0525   RDW 13.6 08/21/2021 1009   RDW 16.0 (H) 03/13/2014 1049   LYMPHSABS 1.5 06/10/2023 0525   LYMPHSABS 1.2 12/11/2020 1005   LYMPHSABS 1.9 09/07/2013 1435   MONOABS 0.7 06/10/2023 0525    MONOABS 0.4 09/07/2013 1435   EOSABS 0.2 06/10/2023 0525   EOSABS 0.1 12/11/2020 1005   EOSABS 0.2 09/07/2013 1435   BASOSABS 0.0 06/10/2023 0525   BASOSABS 0.0 12/11/2020 1005   BASOSABS 0.0 09/07/2013 1435    Dialysis Access: LUE AVF +T/B   Dialysis Orders: Center:  [facility]  on  TTS . EDW 96.5 kg HD Bath 2K/2.5Ca  Time 3 hours Heparin   2000 unit IVP then 100 units/hr. Access LUE AVF BFR 300  DFR 600        Assessment/Plan:  Septic arthritis of right knee - s/p I&D.  Ortho following.  Currently on IV vancomycin  per primary  ESRD -  plan for HD today (wanted to get back on TTS schedule while he remains an inpatient, however high census delayed HD until today).  Will get back on TTS schedule by Saturday.  Hypertension/volume  -  stable and UF as tolerated.  Only about 1 kg above edw.  Anemia  - follow hgb  Metabolic bone disease -  continue with home meds  Nutrition -  renal diet, carb modified.  S/p DDKT - failing and resumed HD.  Ok to continue with immunosuppressive agents for now.  Followed by Rina Cheek.  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  Benjamin Brands, MD [employer] [phone]

## 2023-06-10 NOTE — Progress Notes (Signed)
   ORTHOPAEDIC PROGRESS NOTE  s/p Procedure(s): KNEE ARTHROTOMY for right knee septic arthritis  DOS: 06/08/2023  SUBJECTIVE: No issues overall.  Pain in his right knee is worse this morning.  He is complaining of pain in the superior aspect of the knee.  No fevers or chills.  He is set for dialysis today.  OBJECTIVE: PE:  Alert and oriented.  No acute distress.  After removal of the surgical dressing, sutures are clean, dry and intact.  Improved swelling of the knee.  There is no effusion.  He does have tenderness to palpation superior to the patella.  Some pain with motion.  Vitals:   06/09/23 2208 06/10/23 0408  BP: 128/78 (!) 150/81  Pulse: 72 73  Resp: 20 20  Temp: 98.6 F (37 C) 98.9 F (37.2 C)  SpO2: 96% 96%      Latest Ref Rng & Units 06/10/2023    5:25 AM 06/08/2023    5:05 AM 06/07/2023    8:02 PM  CBC  WBC 4.0 - 10.5 K/uL 6.2  5.6  6.0   Hemoglobin 13.0 - 17.0 g/dL 8.1  8.5  9.1   Hematocrit 39.0 - 52.0 % 29.7  31.1  32.8   Platelets 150 - 400 K/uL 322  270  262      ASSESSMENT: Raymond [name] is a 63 y.o. male doing well postoperatively.  Continues to have pain following surgery.  Limited motion.   PLAN: Weightbearing: WBAT RLE Incisional and dressing care: Reinforce dressings as needed; dressing was changed this morning. Orthopedic device(s): None VTE prophylaxis: Recommend 81 mg of aspirin , twice daily.  No contraindications from an orthopedic standpoint. Pain control: As needed Follow - up plan: 2 weeks  Cultures continues to be without growth.  ID is following.  Continue to treat his pain.  Current irritation likely secondary to the infection, and subsequent surgery.  This was discussed with the patient.  He states his understanding.  Will continue to follow, but he can be discharged as long as we have a good plan with antibiotics.  Otherwise, follow-up in clinic in approximately 2 weeks   Contact information:     Skyelar Swigart A. Ernesta Heading, MD  [facility] [address] [place],  [place]  [address] Phone: [phone] Fax: [phone]

## 2023-06-11 LAB — BODY FLUID CULTURE W GRAM STAIN

## 2023-06-11 LAB — GLUCOSE, CAPILLARY
Glucose-Capillary: 115 mg/dL — ABNORMAL HIGH (ref 70–99)
Glucose-Capillary: 153 mg/dL — ABNORMAL HIGH (ref 70–99)
Glucose-Capillary: 152 mg/dL — ABNORMAL HIGH (ref 70–99)
Glucose-Capillary: 108 mg/dL — ABNORMAL HIGH (ref 70–99)
Glucose-Capillary: 207 mg/dL — ABNORMAL HIGH (ref 70–99)

## 2023-06-11 LAB — MRSA NEXT GEN BY PCR, NASAL: MRSA by PCR Next Gen: NOT DETECTED

## 2023-06-11 LAB — HEPATITIS B SURFACE ANTIBODY, QUANTITATIVE: Hep B S AB Quant (Post): 8.9 m[IU]/mL — ABNORMAL LOW

## 2023-06-11 MED ORDER — FUROSEMIDE 20 MG PO TABS: 20.0000 mg | ORAL_TABLET | Freq: Two times a day (BID) | ORAL | Status: DC | PRN

## 2023-06-11 MED ORDER — PREDNISONE 5 MG PO TABS
5.0000 mg | ORAL_TABLET | Freq: Two times a day (BID) | ORAL | Status: DC
  Administered 2023-06-11 – 2023-06-12 (×3): 5 mg via ORAL
  Filled 2023-06-11 (×3): qty 1

## 2023-06-11 MED ORDER — OXYCODONE-ACETAMINOPHEN 5-325 MG PO TABS: 1.0000 | ORAL_TABLET | ORAL | Status: DC | PRN

## 2023-06-11 MED ORDER — MYCOPHENOLATE SODIUM 180 MG PO TBEC
720.0000 mg | DELAYED_RELEASE_TABLET | Freq: Two times a day (BID) | ORAL | Status: DC
  Administered 2023-06-11 – 2023-06-12 (×3): 720 mg via ORAL
  Filled 2023-06-11 (×9): qty 4

## 2023-06-11 MED ORDER — TACROLIMUS 0.5 MG PO CAPS
1.5000 mg | ORAL_CAPSULE | Freq: Two times a day (BID) | ORAL | Status: DC
  Administered 2023-06-11 – 2023-06-12 (×3): 1.5 mg via ORAL
  Filled 2023-06-11 (×9): qty 3

## 2023-06-11 NOTE — Progress Notes (Addendum)
Patient's nephrology office was called to verify medications, per office as of 06/11/23:  Should not be on Imuran- stopped 02/2023 Should be on myfortic 720 mg BID- not cellcept Prograf 1.5 mg BID Prednisone 5 mg BID Furosemide 80 mg BID PRN Sodium bicarb 1300 mg BID

## 2023-06-11 NOTE — Plan of Care (Signed)
  Problem: Education: Goal: Ability to describe self-care measures that may prevent or decrease complications (Diabetes Survival Skills Education) will improve Outcome: Progressing   Problem: Skin Integrity: Goal: Risk for impaired skin integrity will decrease Outcome: Progressing   Problem: Coping: Goal: Level of anxiety will decrease Outcome: Progressing

## 2023-06-11 NOTE — Hospital Course (Signed)
63 y.o. male with medical history significant of hypertension, ESRD on HD (TTS) status post kidney transplant (03/09/2015) and now back on dialysis, T2DM, hypertension, hyperlipidemia, right BKA who presents to the emergency department due to 1 month history of right knee pain that has progressively worsened , and he came to ED for further evaluation

## 2023-06-11 NOTE — Significant Event (Signed)
RRT called at 2017 for change in mental status during HD. Reportedly fully oriented and conversational prior to HD.  Today dialysis circuit had clotted so he lost about 300 cc of blood.  No other issues during dialysis, no hypotensive episodes.  Was 2 hr into HD session and awoke with AMS, not following commands. VS 89 HR, 126/93 BP, 100% RA, temp 100.58F, 17 RR. On my evaluation he is awake and intermittently alert to examiner. Oriented to self only. Speech is clear. Neuro exam limited due to not following commands, PERRL, no facial droop. Motor is at least 4/5 and symmetric in upper and lower extremities. Sensation appears intact to fine touch. CBG 115 at time of event. Per collateral from wife given to RN sometimes has similar response after pain medication and did receive oxycodone and Dilaudid earlier prior to his HD session  A/P   Acute onset of encephalopathy, without focal deficit  -Hold off on stroke alert as no focal deficits appears to be more consistent with encephalopathy. -CT head -Check BMP, mag, Phos, CBC, lactate, VBG -Advised dialysis RN to discontinue session early given acute change. -Discontinue Dilaudid prn, reduce Percocet to 1 tablet as needed every 4 hours.  Update: Lab evaluation and CT head without explanation for his acute mental status change.  Etiology possibly adverse medication reaction from opiates, delirium in the setting of his underlying infection and hospitalization, possible missed hypotensive episodes associated with HD.  Will plan for close monitoring and stepdown for now no changes other than above.  Dolly Rias, MD  [facility]

## 2023-06-11 NOTE — Progress Notes (Signed)
Patient ID: Raymond [name], male   DOB: 09-27-1960, 63 y.o.   MRN: [id number]  Dialysis Access: LUE AVF +T/B   Dialysis Orders: Center:  [facility]  on  TTS . EDW 96.5 kg HD Bath 2K/2.5Ca  Time 3 hours Heparin  2000 unit IVP then 100 units/hr. Access LUE AVF BFR 300  DFR 600        Assessment/Plan:  Septic arthritis of right knee - s/p I&D.  Ortho following.  Currently on IV vancomycin per primary  ESRD -  plan for HD today (wanted to get back on TTS schedule while he remains an inpatient, however high census delayed HD until today).  Will get back on TTS schedule by Saturday.  Patient has only received 1 treatment this week only for 2 hours we will plan on a 3-hour treatment Friday and then he can receive an outpatient tx if he is discharged sometime Fri.  Hypertension/volume  -  stable and UF as tolerated.  Only about 1 kg above edw. AMS -thought to be a CVA with no focal deficits and thought more c/w encephalopathy  Anemia  - follow hgb  Metabolic bone disease -  continue with home meds  Nutrition -  renal diet, carb modified.  S/p DDKT - failing and resumed HD.  Ok to continue with immunosuppressive agents for now.  Followed by Larrie Kass.  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  S: Dialysis had to be stopped Wednesday 2 hours into the session, chamber clotted after air was detected in the [place] With 300 mL of blood loss.  Unfortunately change in mental status during set up with rapid response called subsequently.  Vitals were stable during that time.   O:BP (!) 137/44   Pulse 80   Temp 99.5 F (37.5 C) (Oral)   Resp 14   Ht 5\' 9"  (1.753 m)   Wt 97.6 kg   SpO2 100%   BMI 31.77 kg/m   Intake/Output Summary (Last 24 hours) at 06/11/2023 0914 Last data filed at 06/10/2023 2006 Gross per 24 hour  Intake 480 ml  Output 800 ml  Net -320 ml   Intake/Output: I/O last 3 completed shifts: In: 1500 [P.O.:1200; IV Piggyback:300] Out: 800 [Other:800]  Intake/Output  this shift:  No intake/output data recorded. Weight change:  Gen: NAD CVS: RRR Resp:CTA Abd: +BS, soft, TN/ND Ext: s/p RBKA, right leg wrapped, LUE BCF aneurysmal +T/B  Recent Labs  Lab 06/07/23 2002 06/08/23 0505 06/10/23 0525 06/10/23 2043  NA 136 135 137 134*  K 4.0 4.0 4.8 4.3  CL 101 99 100 98  CO2 26 26 25 25   GLUCOSE 93 133* 159* 149*  BUN 40* 43* 70* 47*  CREATININE 3.51* 4.06* 5.61* 4.22*  ALBUMIN  --  2.2*  --   --   CALCIUM 8.9 8.8* 8.9 8.4*  PHOS  --  5.7*  --  4.9*  AST  --  11*  --   --   ALT  --  14  --   --    Liver Function Tests: Recent Labs  Lab 06/08/23 0505  AST 11*  ALT 14  ALKPHOS 74  BILITOT 0.4  PROT 7.8  ALBUMIN 2.2*   No results for input(s): "LIPASE", "AMYLASE" in the last 168 hours. No results for input(s): "AMMONIA" in the last 168 hours. CBC: Recent Labs  Lab 06/07/23 2002 06/08/23 0505 06/10/23 0525 06/10/23 2043  WBC 6.0 5.6 6.2 8.0  NEUTROABS  --   --  3.8  --   HGB 9.1* 8.5* 8.1* 8.4*  HCT 32.8* 31.1* 29.7* 30.1*  MCV 83.0 83.6 84.6 83.1  PLT 262 270 322 323   Cardiac Enzymes: No results for input(s): "CKTOTAL", "CKMB", "CKMBINDEX", "TROPONINI" in the last 168 hours. CBG: Recent Labs  Lab 06/10/23 1102 06/10/23 1616 06/10/23 1948 06/10/23 2338 06/11/23 0734  GLUCAP 175* 98 115* 169* 153*    Iron Studies: No results for input(s): "IRON", "TIBC", "TRANSFERRIN", "FERRITIN" in the last 72 hours. Studies/Results: CT HEAD WO CONTRAST ( ) Result Date: 06/11/2023 CLINICAL DATA:  Sudden onset encephalopathy EXAM: CT HEAD WITHOUT CONTRAST TECHNIQUE: Contiguous axial images were obtained from the base of the skull through the vertex without intravenous contrast. RADIATION DOSE REDUCTION: This exam was performed according to the departmental dose-optimization program which includes automated exposure control, adjustment of the mA and/or kV according to patient size and/or use of iterative reconstruction technique.  COMPARISON:  02/25/2022 FINDINGS: Brain: No mass,hemorrhage or extra-axial collection. Normal appearance of the parenchyma and CSF spaces. Vascular: Atherosclerotic calcification of the vertebral and internal carotid arteries at the skull base. No abnormal hyperdensity of the major intracranial arteries or dural venous sinuses. Skull: The visualized skull base, calvarium and extracranial soft tissues are normal. Sinuses/Orbits: No fluid levels or advanced mucosal thickening of the visualized paranasal sinuses. No mastoid or middle ear effusion. Normal orbits. Other: None. IMPRESSION: No acute intracranial abnormality. Electronically Signed   By: Deatra Robinson M.D.   On: 06/11/2023 00:31    atorvastatin  10 mg Oral Daily   brimonidine  1 drop Both Eyes TID   Chlorhexidine Gluconate Cloth  6 each Topical Q0600   [START ON 06/14/2023] cloNIDine  0.3 mg Transdermal Weekly   dorzolamide-timolol  1 drop Both Eyes BID   hydrALAZINE  25 mg Oral TID   insulin aspart  0-15 Units Subcutaneous TID WC   insulin glargine-yfgn  15 Units Subcutaneous Daily   latanoprost  1 drop Both Eyes QHS   mupirocin ointment  1 Application Topical BID   mycophenolate  720 mg Oral BID   predniSONE  5 mg Oral BID WC   tacrolimus  1.5 mg Oral BID    BMET    Component Value Date/Time   NA 134 (L) 06/10/2023 2043   NA 142 02/05/2022 1023   NA 142 03/13/2014 1049   K 4.3 06/10/2023 2043   K 4.1 03/13/2014 1049   CL 98 06/10/2023 2043   CL 101 03/13/2014 1049   CO2 25 06/10/2023 2043   CO2 30 03/13/2014 1049   GLUCOSE 149 (H) 06/10/2023 2043   GLUCOSE 217 (H) 03/13/2014 1049   BUN 47 (H) 06/10/2023 2043   BUN 30 (H) 02/05/2022 1023   BUN 45 (H) 03/13/2014 1049   CREATININE 4.22 (H) 06/10/2023 2043   CREATININE 1.30 03/26/2021 1513   CALCIUM 8.4 (L) 06/10/2023 2043   CALCIUM 8.2 (L) 03/13/2014 1049   GFRNONAA 15 (L) 06/10/2023 2043   GFRNONAA 6 (L) 03/13/2014 1049   GFRNONAA 8 (L) 09/07/2013 1435   GFRAA >60  01/20/2020 0510   GFRAA 7 (L) 03/13/2014 1049   GFRAA 9 (L) 09/07/2013 1435   CBC    Component Value Date/Time   WBC 8.0 06/10/2023 2043   RBC 3.62 (L) 06/10/2023 2043   HGB 8.4 (L) 06/10/2023 2043   HGB 9.9 (L) 08/21/2021 1009   HCT 30.1 (L) 06/10/2023 2043   HCT 33.8 (L) 08/21/2021 1009   PLT 323 06/10/2023 2043  PLT 287 08/21/2021 1009   MCV 83.1 06/10/2023 2043   MCV 84 08/21/2021 1009   MCV 87 03/13/2014 1049   MCH 23.2 (L) 06/10/2023 2043   MCHC 27.9 (L) 06/10/2023 2043   RDW 17.1 (H) 06/10/2023 2043   RDW 13.6 08/21/2021 1009   RDW 16.0 (H) 03/13/2014 1049   LYMPHSABS 1.5 06/10/2023 0525   LYMPHSABS 1.2 12/11/2020 1005   LYMPHSABS 1.9 09/07/2013 1435   MONOABS 0.7 06/10/2023 0525   MONOABS 0.4 09/07/2013 1435   EOSABS 0.2 06/10/2023 0525   EOSABS 0.1 12/11/2020 1005   EOSABS 0.2 09/07/2013 1435   BASOSABS 0.0 06/10/2023 0525   BASOSABS 0.0 12/11/2020 1005   BASOSABS 0.0 09/07/2013 1435

## 2023-06-11 NOTE — Progress Notes (Signed)
PT Cancellation Note  Patient Details Name: [name] MRN: [id number] DOB: 06-28-1960   Cancelled Treatment:    Reason Eval/Treat Not Completed: Medical issues which prohibited therapy.  Patient transferred to a higher level of care and will need new PT consult to resume therapy when patient is medically stable.  Thank you.    7:56 AM, 06/11/23 Ocie Bob, MPT Physical Therapist with [facility] [phone] office [phone] mobile phone

## 2023-06-11 NOTE — Plan of Care (Signed)
  Problem: Nutritional: Goal: Maintenance of adequate nutrition will improve Outcome: Progressing   Problem: Clinical Measurements: Goal: Respiratory complications will improve Outcome: Progressing Goal: Cardiovascular complication will be avoided Outcome: Progressing   Problem: Coping: Goal: Level of anxiety will decrease Outcome: Progressing   Problem: Safety: Goal: Ability to remain free from injury will improve Outcome: Progressing

## 2023-06-11 NOTE — Progress Notes (Addendum)
PROGRESS NOTE  Raymond [name]  VWU:[id number] DOB: 1961-03-06 DOA: 06/07/2023 PCP: [name], FNP   Chief Complaint  Patient presents with   Knee Pain   Level of care: Stepdown  Brief Admission History:  63 y.o. male with medical history significant of hypertension, ESRD on HD (TTS) status post kidney transplant (03/09/2015) and now back on dialysis, T2DM, hypertension, hyperlipidemia, right BKA who presents to the emergency department due to 1 month history of right knee pain that has progressively worsened , and he came to ED for further evaluation    Assessment and Plan:  Right knee pain from possible septic arthritis:  Synovial fluid analysis shows wbc count of 81,450. Neutrophils show 96%. Fluid cultures remain pending.  Orthopedics consulted, and underwent arthrotomy for septic arthritis.  He was started on IV vancomycin and IV cefepime added.  Cultures are pending. Discussed with Dr Thedore Mins with ID,  recommended to wait for the cultures.  Pain control with oxycodone.  Dilaudid discontinued due to AMS.      ESRD on HD  S/p kidney transplant in 2016. TTS  schedule.  HD planned for 1/17 3 hours then resume outpatient on 1/18  Continue with  mycophenolate, Tacrolimus.  Patient said that he is not taking azathioprine, will discontinue azathioprine at this time.   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  CBG well-controlled    Hypertension:  Well controlled.     Hyperlipidemia Resume lipitor 10 mg daily  DVT prophylaxis: SCDs Code Status: Full  Family Communication: bedside update 1/16  Disposition:    Consultants:  Nephrology   Procedures:   Antimicrobials:  Cefepime/vancomycin    Subjective: Pt says he is feeling much better today.  Knee pain seems to be getting better.    Objective: Vitals:   06/11/23 1100  06/11/23 1128 06/11/23 1200 06/11/23 1442  BP: (!) 127/55  (!) 141/87   Pulse: 74   71  Resp: 14  (!) 22 11  Temp:  98.1 F (36.7 C)    TempSrc:  Oral    SpO2: 99%   95%  Weight:      Height:        Intake/Output Summary (Last 24 hours) at 06/11/2023 1522 Last data filed at 06/11/2023 1243 Gross per 24 hour  Intake 720 ml  Output 800 ml  Net -80 ml   Filed Weights   06/07/23 1751 06/08/23 1542 06/10/23 1735  Weight: 97.5 kg 97.5 kg 97.6 kg   Examination:  General exam: Appears calm and comfortable  Respiratory system: Clear to auscultation. Respiratory effort normal. Cardiovascular system: normal S1 & S2 heard. No JVD, murmurs, rubs, gallops or clicks. No pedal edema. Gastrointestinal system: Abdomen is nondistended, soft and nontender. No organomegaly or masses felt. Normal bowel sounds heard. Central nervous system: Alert and oriented. No focal neurological deficits. Extremities: Symmetric 5 x 5 power. Skin: No rashes, lesions or ulcers. Psychiatry: Judgement and insight appear normal. Mood & affect appropriate.   Data Reviewed: I have personally reviewed following labs and imaging studies  CBC: Recent Labs  Lab 06/07/23 2002 06/08/23 0505 06/10/23 0525 06/10/23 2043  WBC 6.0 5.6 6.2 8.0  NEUTROABS  --   --  3.8  --   HGB 9.1* 8.5* 8.1* 8.4*  HCT 32.8* 31.1* 29.7* 30.1*  MCV 83.0 83.6 84.6 83.1  PLT 262 270 322 323    Basic Metabolic Panel: Recent Labs  Lab 06/07/23 2002 06/08/23 0505 06/10/23 0525 06/10/23 2043  NA 136 135 137 134*  K 4.0 4.0 4.8 4.3  CL 101 99 100 98  CO2 26 26 25 25   GLUCOSE 93 133* 159* 149*  BUN 40* 43* 70* 47*  CREATININE 3.51* 4.06* 5.61* 4.22*  CALCIUM 8.9 8.8* 8.9 8.4*  MG  --  2.1  --  1.8  PHOS  --  5.7*  --  4.9*    CBG: Recent Labs  Lab 06/10/23 1616 06/10/23 1948 06/10/23 2338 06/11/23 0734 06/11/23 1118  GLUCAP 98 115* 169* 153* 152*    Recent Results (from the past 240 hours)  Microscopic Examination      Status: Abnormal   Collection Time: 06/02/23 11:38 AM   Urine  Result Value Ref Range Status   WBC, UA >30 (A) 0 - 5 /hpf Final   RBC, Urine 0-2 0 - 2 /hpf Final   Epithelial Cells (non renal) 0-10 0 - 10 /hpf Final   Bacteria, UA Few (A) None seen/Few Final  Body fluid culture w Gram Stain     Status: None   Collection Time: 06/07/23  8:34 PM   Specimen: Synovium; Body Fluid  Result Value Ref Range Status   Specimen Description   Final    SYNOVIAL Performed at [facility], [address], [place], [place] [address]    Special Requests   Final    Immunocompromised Performed at [facility], [address] [place], [place] [address]    Gram Stain   Final    MODERATE WBC PRESENT, PREDOMINANTLY PMN NO ORGANISMS SEEN    Culture   Final    NO GROWTH 3 DAYS Performed at [facility], [address], [place], [place] [address]    Report Status 06/11/2023 FINAL  Final  Aerobic/Anaerobic Culture w Gram Stain (surgical/deep wound)     Status: None (Preliminary result)   Collection Time: 06/08/23  6:21 PM   Specimen: PATH Cytology CSF; Body Fluid  Result Value Ref Range Status   Specimen Description   Final    WOUND Performed at [facility], [address], [place], [place] [address]    Special Requests   Final    NONE Performed at [facility], [address] [place], [place] [address]    Gram Stain   Final    MODERATE WBC PRESENT, PREDOMINANTLY PMN NO ORGANISMS SEEN    Culture   Final    NO GROWTH 3 DAYS NO ANAEROBES ISOLATED; CULTURE IN PROGRESS FOR 5 DAYS Performed at [facility], [address], [place], [place] [address]    Report Status PENDING  Incomplete  MRSA Next Gen by PCR, Nasal     Status: None   Collection Time: 06/10/23  5:00 PM   Specimen: Nasal Mucosa; Nasal Swab  Result Value Ref Range Status   MRSA by PCR Next Gen NOT DETECTED NOT DETECTED Final    Comment: (NOTE) The GeneXpert MRSA Assay (FDA approved for NASAL specimens  only), is one component of a comprehensive MRSA colonization surveillance program. It is not intended to diagnose MRSA infection nor to guide or monitor treatment for MRSA infections. Test performance is not FDA approved in patients less than 83 years old. Performed at [facility], [address] [place], [place] [address]      Radiology Studies: CT HEAD WO CONTRAST ( )  Result Date: 06/11/2023 CLINICAL DATA:  Sudden onset encephalopathy EXAM: CT HEAD WITHOUT CONTRAST TECHNIQUE: Contiguous axial images were obtained from the base of the skull through the vertex without intravenous contrast. RADIATION DOSE REDUCTION: This exam was performed according to the departmental dose-optimization program which includes automated exposure control, adjustment of the mA and/or kV according to patient size and/or use of iterative reconstruction technique. COMPARISON:  02/25/2022 FINDINGS: Brain: No mass,hemorrhage or extra-axial collection. Normal appearance of the parenchyma and CSF spaces. Vascular: Atherosclerotic calcification of the vertebral and internal carotid arteries at the skull base. No abnormal hyperdensity of the major intracranial arteries or dural venous sinuses. Skull: The visualized skull base, calvarium and extracranial soft tissues are normal. Sinuses/Orbits: No fluid levels or advanced mucosal thickening of the visualized paranasal sinuses. No mastoid or middle ear effusion. Normal orbits. Other: None. IMPRESSION: No acute intracranial abnormality. Electronically Signed   By: Deatra Robinson M.D.   On: 06/11/2023 00:31    Scheduled Meds:  atorvastatin  10 mg Oral Daily   brimonidine  1 drop Both Eyes TID   Chlorhexidine Gluconate Cloth  6 each Topical Q0600   [START ON 06/14/2023] cloNIDine  0.3 mg Transdermal Weekly   dorzolamide-timolol  1 drop Both Eyes BID   hydrALAZINE  25 mg Oral TID   insulin aspart  0-15 Units Subcutaneous TID WC   insulin glargine-yfgn  15 Units Subcutaneous  Daily   latanoprost  1 drop Both Eyes QHS   mupirocin ointment  1 Application Topical BID   mycophenolate  720 mg Oral BID   predniSONE  5 mg Oral BID WC   tacrolimus  1.5 mg Oral BID   Continuous Infusions:  [START ON 06/13/2023] ceFEPime (MAXIPIME) IV     [START ON 06/13/2023] vancomycin      LOS: 4 days   Time spent: 55 mins  Massiel Stipp Laural Benes, MD How to contact the [facility] Attending or Consulting provider 7A - 7P or covering provider during after hours 7P -7A, for this patient?  Check the care team in [facility] and look for a) attending/consulting [facility] provider listed and b) the [facility] team listed Log into [email or web address] to find provider on call.  Locate the [facility] provider you are looking for under [facility] and page to a number that you can be directly reached. If you still have difficulty reaching the provider, please page the [facility] (Director on Call) for the Hospitalists listed on amion for assistance.  06/11/2023, 3:22 PM

## 2023-06-12 LAB — GLUCOSE, CAPILLARY
Glucose-Capillary: 210 mg/dL — ABNORMAL HIGH (ref 70–99)
Glucose-Capillary: 127 mg/dL — ABNORMAL HIGH (ref 70–99)

## 2023-06-12 MED ORDER — VANCOMYCIN HCL 500 MG/100ML IV SOLN: 500.0000 mg | INTRAVENOUS | Status: DC

## 2023-06-12 MED ORDER — HYDRALAZINE HCL 25 MG PO TABS: 50.0000 mg | ORAL_TABLET | Freq: Three times a day (TID) | ORAL | Status: DC

## 2023-06-12 MED ORDER — SODIUM CHLORIDE 0.9 % IV SOLN
1.0000 g | Freq: Once | INTRAVENOUS | Status: DC
  Filled 2023-06-12: qty 10

## 2023-06-12 MED ORDER — TACROLIMUS 0.5 MG PO CAPS: 1.5000 mg | ORAL_CAPSULE | Freq: Two times a day (BID) | ORAL | Status: DC

## 2023-06-12 MED ORDER — NOVOLIN N FLEXPEN 100 UNIT/ML ~~LOC~~ SUPN: 30.0000 [IU] | PEN_INJECTOR | Freq: Two times a day (BID) | SUBCUTANEOUS | Status: DC

## 2023-06-12 NOTE — Progress Notes (Signed)
Patient ID: Raymond [name], male   DOB: 12/06/1960, 63 y.o.   MRN: [id number]  Dialysis Access: LUE AVF +T/B   Dialysis Orders: Center:  [facility]  on  TTS . EDW 96.5 kg HD Bath 2K/2.5Ca  Time 3 hours Heparin  2000 unit IVP then 100 units/hr. Access LUE AVF BFR 300  DFR 600      Assessment/Plan:  Septic arthritis of right knee - s/p I&D.  Ortho following.  Currently on IV vancomycin per primary  ESRD -  Wanted to get back on TTS schedule while he remains an inpatient, however high census delayed 1st HD until [place].  Will get back on TTS schedule by Saturday.  Patient has only received 1 treatment this week only for 2 hours which is why we dialyzed today.   Seen on HD  3K bath 1L net UF as tolerated 157/57 and conversing without any SOB  Will write orders for HD Sat if he's still here but appears that he may be discharged.    Hypertension/volume  -  stable and UF as tolerated.  Only about 1 kg above edw. AMS -thought to be a CVA with no focal deficits and thought more c/w encephalopathy  Anemia  - follow hgb  Metabolic bone disease -  continue with home meds  Nutrition -  renal diet, carb modified.  S/p DDKT - failing and resumed HD.  Ok to continue with immunosuppressive agents for now.  Followed by Larrie Kass.  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  S: Dialysis had to be stopped Wednesday 2 hours into the session, chamber clotted after air was detected in the [place] With 300 mL of blood loss.  Unfortunately change in mental status during set up with rapid response called subsequently.  Vitals were stable during that time.   O:BP (!) 166/72   Pulse 71   Temp 97.6 F (36.4 C) (Oral)   Resp 15   Ht 5\' 9"  (1.753 m)   Wt 97.6 kg   SpO2 100%   BMI 31.77 kg/m   Intake/Output Summary (Last 24 hours) at 06/12/2023 0852 Last data filed at 06/11/2023 1243 Gross per 24 hour  Intake 480 ml  Output --  Net 480 ml   Intake/Output: I/O last 3 completed shifts: In: 480  [P.O.:480] Out: 800 [Other:800]  Intake/Output this shift:  No intake/output data recorded. Weight change:  Gen: NAD CVS: RRR Resp:CTA Abd: +BS, soft, TN/ND Ext: s/p RBKA, right leg wrapped, LUE BCF aneurysmal +T/B  Recent Labs  Lab 06/07/23 2002 06/08/23 0505 06/10/23 0525 06/10/23 2043  NA 136 135 137 134*  K 4.0 4.0 4.8 4.3  CL 101 99 100 98  CO2 26 26 25 25   GLUCOSE 93 133* 159* 149*  BUN 40* 43* 70* 47*  CREATININE 3.51* 4.06* 5.61* 4.22*  ALBUMIN  --  2.2*  --   --   CALCIUM 8.9 8.8* 8.9 8.4*  PHOS  --  5.7*  --  4.9*  AST  --  11*  --   --   ALT  --  14  --   --    Liver Function Tests: Recent Labs  Lab 06/08/23 0505  AST 11*  ALT 14  ALKPHOS 74  BILITOT 0.4  PROT 7.8  ALBUMIN 2.2*   No results for input(s): "LIPASE", "AMYLASE" in the last 168 hours. No results for input(s): "AMMONIA" in the last 168 hours. CBC: Recent Labs  Lab 06/07/23 2002 06/08/23 0505 06/10/23 7829  06/10/23 2043  WBC 6.0 5.6 6.2 8.0  NEUTROABS  --   --  3.8  --   HGB 9.1* 8.5* 8.1* 8.4*  HCT 32.8* 31.1* 29.7* 30.1*  MCV 83.0 83.6 84.6 83.1  PLT 262 270 322 323   Cardiac Enzymes: No results for input(s): "CKTOTAL", "CKMB", "CKMBINDEX", "TROPONINI" in the last 168 hours. CBG: Recent Labs  Lab 06/11/23 0734 06/11/23 1118 06/11/23 1527 06/11/23 1937 06/12/23 0743  GLUCAP 153* 152* 108* 207* 210*    Iron Studies: No results for input(s): "IRON", "TIBC", "TRANSFERRIN", "FERRITIN" in the last 72 hours. Studies/Results: CT HEAD WO CONTRAST ( ) Result Date: 06/11/2023 CLINICAL DATA:  Sudden onset encephalopathy EXAM: CT HEAD WITHOUT CONTRAST TECHNIQUE: Contiguous axial images were obtained from the base of the skull through the vertex without intravenous contrast. RADIATION DOSE REDUCTION: This exam was performed according to the departmental dose-optimization program which includes automated exposure control, adjustment of the mA and/or kV according to patient size and/or  use of iterative reconstruction technique. COMPARISON:  02/25/2022 FINDINGS: Brain: No mass,hemorrhage or extra-axial collection. Normal appearance of the parenchyma and CSF spaces. Vascular: Atherosclerotic calcification of the vertebral and internal carotid arteries at the skull base. No abnormal hyperdensity of the major intracranial arteries or dural venous sinuses. Skull: The visualized skull base, calvarium and extracranial soft tissues are normal. Sinuses/Orbits: No fluid levels or advanced mucosal thickening of the visualized paranasal sinuses. No mastoid or middle ear effusion. Normal orbits. Other: None. IMPRESSION: No acute intracranial abnormality. Electronically Signed   By: Deatra Robinson M.D.   On: 06/11/2023 00:31    atorvastatin  10 mg Oral Daily   brimonidine  1 drop Both Eyes TID   Chlorhexidine Gluconate Cloth  6 each Topical Q0600   [START ON 06/14/2023] cloNIDine  0.3 mg Transdermal Weekly   dorzolamide-timolol  1 drop Both Eyes BID   hydrALAZINE  25 mg Oral TID   insulin aspart  0-15 Units Subcutaneous TID WC   insulin glargine-yfgn  15 Units Subcutaneous Daily   latanoprost  1 drop Both Eyes QHS   mupirocin ointment  1 Application Topical BID   mycophenolate  720 mg Oral BID   predniSONE  5 mg Oral BID WC   tacrolimus  1.5 mg Oral BID    BMET    Component Value Date/Time   NA 134 (L) 06/10/2023 2043   NA 142 02/05/2022 1023   NA 142 03/13/2014 1049   K 4.3 06/10/2023 2043   K 4.1 03/13/2014 1049   CL 98 06/10/2023 2043   CL 101 03/13/2014 1049   CO2 25 06/10/2023 2043   CO2 30 03/13/2014 1049   GLUCOSE 149 (H) 06/10/2023 2043   GLUCOSE 217 (H) 03/13/2014 1049   BUN 47 (H) 06/10/2023 2043   BUN 30 (H) 02/05/2022 1023   BUN 45 (H) 03/13/2014 1049   CREATININE 4.22 (H) 06/10/2023 2043   CREATININE 1.30 03/26/2021 1513   CALCIUM 8.4 (L) 06/10/2023 2043   CALCIUM 8.2 (L) 03/13/2014 1049   GFRNONAA 15 (L) 06/10/2023 2043   GFRNONAA 6 (L) 03/13/2014 1049    GFRNONAA 8 (L) 09/07/2013 1435   GFRAA >60 01/20/2020 0510   GFRAA 7 (L) 03/13/2014 1049   GFRAA 9 (L) 09/07/2013 1435   CBC    Component Value Date/Time   WBC 8.0 06/10/2023 2043   RBC 3.62 (L) 06/10/2023 2043   HGB 8.4 (L) 06/10/2023 2043   HGB 9.9 (L) 08/21/2021 1009   HCT 30.1 (L)  06/10/2023 2043   HCT 33.8 (L) 08/21/2021 1009   PLT 323 06/10/2023 2043   PLT 287 08/21/2021 1009   MCV 83.1 06/10/2023 2043   MCV 84 08/21/2021 1009   MCV 87 03/13/2014 1049   MCH 23.2 (L) 06/10/2023 2043   MCHC 27.9 (L) 06/10/2023 2043   RDW 17.1 (H) 06/10/2023 2043   RDW 13.6 08/21/2021 1009   RDW 16.0 (H) 03/13/2014 1049   LYMPHSABS 1.5 06/10/2023 0525   LYMPHSABS 1.2 12/11/2020 1005   LYMPHSABS 1.9 09/07/2013 1435   MONOABS 0.7 06/10/2023 0525   MONOABS 0.4 09/07/2013 1435   EOSABS 0.2 06/10/2023 0525   EOSABS 0.1 12/11/2020 1005   EOSABS 0.2 09/07/2013 1435   BASOSABS 0.0 06/10/2023 0525   BASOSABS 0.0 12/11/2020 1005   BASOSABS 0.0 09/07/2013 1435

## 2023-06-12 NOTE — Progress Notes (Signed)
Pt transported to HD.

## 2023-06-12 NOTE — Progress Notes (Addendum)
Pt taking off venous needle out by accident at last 10 mins. Blood unable to returned. Dr.Lin notified.   06/12/23 1220  Vitals  Temp 98.2 F (36.8 C)  Temp Source Oral  BP 120/60  BP Location Right Arm  BP Method Automatic  Patient Position (if appropriate) Lying  Pulse Rate 68  Resp 14  Oxygen Therapy  SpO2 100 %  O2 Device Room Air  During Treatment Monitoring  Intra-Hemodialysis Comments Tx completed  Post Treatment  Dialyzer Clearance Lightly streaked  Hemodialysis Intake (mL) 0 mL  Liters Processed 66  Fluid Removed (mL) 1000 mL  Tolerated HD Treatment Yes  Post-Hemodialysis Comments Pt goal met.  AVG/AVF Arterial Site Held (minutes) 10 minutes  AVG/AVF Venous Site Held (minutes) 10 minutes  Fistula / Graft Left Upper arm Arteriovenous fistula  No placement date or time found.   Placed prior to admission: Yes  Orientation: Left  Access Location: Upper arm  Access Type: Arteriovenous fistula  Site Condition No complications  Fistula / Graft Assessment Present;Thrill;Bruit  Status Deaccessed  Needle Size 15  Drainage Description None

## 2023-06-12 NOTE — Discharge Instructions (Addendum)
INFECTIOUS DISEASE RECOMMENDS THAT YOU BE TREATED WITH ANTIBIOTICS WITH DIALYSIS THRU 07/04/23.  YOU HAVE APPT WITH ID ON 07/02/23 WITH INFECTION DISEASE DR. SINGH.   PLEASE GO TO REGULAR DIALYSIS TREATMENT SESSION TOMORROW ON SCHEDULE   PLEASE FOLLOW UP WITH DR. Dallas Schimke IN 2 WEEKS (ORTHOPEDIC SURGEON)   IMPORTANT INFORMATION: PAY CLOSE ATTENTION   PHYSICIAN DISCHARGE INSTRUCTIONS  Follow with Primary care provider  [name], FNP  and other consultants as instructed by your Hospitalist Physician  SEEK MEDICAL CARE OR RETURN TO EMERGENCY ROOM IF SYMPTOMS COME BACK, WORSEN OR NEW PROBLEM DEVELOPS   Please note: You were cared for by a hospitalist during your hospital stay. Every effort will be made to forward records to your primary care provider.  You can request that your primary care provider send for your hospital records if they have not received them.  Once you are discharged, your primary care physician will handle any further medical issues. Please note that NO REFILLS for any discharge medications will be authorized once you are discharged, as it is imperative that you return to your primary care physician (or establish a relationship with a primary care physician if you do not have one) for your post hospital discharge needs so that they can reassess your need for medications and monitor your lab values.  Please get a complete blood count and chemistry panel checked by your Primary MD at your next visit, and again as instructed by your Primary MD.  Get Medicines reviewed and adjusted: Please take all your medications with you for your next visit with your Primary MD  Laboratory/radiological data: Please request your Primary MD to go over all hospital tests and procedure/radiological results at the follow up, please ask your primary care provider to get all Hospital records sent to his/her office.  In some cases, they will be blood work, cultures and biopsy results pending at the  time of your discharge. Please request that your primary care provider follow up on these results.  If you are diabetic, please bring your blood sugar readings with you to your follow up appointment with primary care.    Please call and make your follow up appointments as soon as possible.    Also Note the following: If you experience worsening of your admission symptoms, develop shortness of breath, life threatening emergency, suicidal or homicidal thoughts you must seek medical attention immediately by calling 911 or calling your MD immediately  if symptoms less severe.  You must read complete instructions/literature along with all the possible adverse reactions/side effects for all the Medicines you take and that have been prescribed to you. Take any new Medicines after you have completely understood and accpet all the possible adverse reactions/side effects.   Do not drive when taking Pain medications or sleeping medications (Benzodiazepines)  Do not take more than prescribed Pain, Sleep and Anxiety Medications. It is not advisable to combine anxiety,sleep and pain medications without talking with your primary care practitioner  Special Instructions: If you have smoked or chewed Tobacco  in the last 2 yrs please stop smoking, stop any regular Alcohol  and or any Recreational drug use.  Wear Seat belts while driving.  Do not drive if taking any narcotic, mind altering or controlled substances or recreational drugs or alcohol.

## 2023-06-12 NOTE — Discharge Summary (Addendum)
Physician Discharge Summary  Raymond [name] EXB:[id number] DOB: [birth year] DOA: 06/07/2023  PCP: [name], FNP ID: Judie Petit. Thedore Mins Orthopedics: Dr. Dallas Schimke  Admit date: 06/07/2023 Discharge date: 06/12/2023  Admitted From:  Home  Disposition: [facility] with [facility]   Recommendations for Outpatient Follow-up:  Follow up with Dr. Dallas Schimke in 2 weeks Follow up with Dr. Thedore Mins on 07/02/23 as scheduled  Resume regular TTS hemodialysis schedule Per Dr. Thedore Mins: plan to give him 4 weeks of abx with HD thru 07/05/23 to treat his septic knee. Please give Vancomycin and either Cefepime OR Ceftazidime with HD Please follow up on the following pending results: final body fluid culture   Home Health: PT   Discharge Condition: STABLE   CODE STATUS: FULL DIET: renal/carb modified    Brief Hospitalization Summary: Please see all hospital notes, images, labs for full details of the hospitalization. Admission provider HPI: 63 y.o. male with medical history significant of hypertension, ESRD on HD (TTS) status post kidney transplant (03/09/2015) and now back on dialysis, T2DM, hypertension, hyperlipidemia, right BKA who presents to the emergency department due to 1 month history of right knee pain that has progressively worsened , and he came to ED for further evaluation   Hospital Course by problem list  Right knee pain from possible septic arthritis:  Synovial fluid analysis shows wbc count of 81,450. Neutrophils show 96%. Fluid cultures remain pending at time of discharge. 4 days no growth. Orthopedics consulted, and underwent arthrotomy for septic arthritis.  He was started on IV vancomycin and IV cefepime was added.  Discussed with ID Dr. Judie Petit. Thedore Mins who recommended vanc + cefepime or ceftazidime with HD thru 07/05/23.  Outpatient follow up with ID clinic on 07/02/23.  Social Worker and pharmacist consulted to make arrangements for patient to receive antibiotics with outpatient HD as recommended.     ESRD on HD  S/p  kidney transplant in 2016. TTS  schedule.  HD planned for 1/17 3 hours then resume outpatient on 1/18  Continue with mycophenolate, Tacrolimus.    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   Vancomycin Rash   Patient having hives to either vancomycin or flagyl        Medication List     STOP taking these medications    Azathioprine 75 MG Tabs   gabapentin 300 MG capsule Commonly known as: NEURONTIN   HYDROcodone-acetaminophen 5-325 MG tablet Commonly known as: NORCO/VICODIN   insulin aspart 100 UNIT/ML injection Commonly known as: novoLOG       TAKE these medications    Accu-Chek Guide Me w/Device  Kit 1 Piece by Does not apply route as directed.   albuterol 108 (90 Base) MCG/ACT inhaler Commonly known as: VENTOLIN HFA Inhale 2 puffs into the lungs every 6 (six) hours as needed for wheezing or shortness of breath.   atorvastatin 10 MG tablet Commonly known as: LIPITOR Take 1 tablet (10 mg total) by mouth daily.   blood glucose meter kit and supplies Kit Dispense based on patient and insurance  preference. Use up to four times daily as directed. (FOR ICD-9 250.00, 250.01).   cloNIDine 0.3 mg/24hr patch Commonly known as: CATAPRES - Dosed in mg/24 hr Place 1 patch (0.3 mg total) onto the skin once a week. What changed: additional instructions   clopidogrel 75 MG tablet Commonly known as: PLAVIX Take 75 mg by mouth daily.   clotrimazole 10 MG troche Commonly known as: MYCELEX Take 10 mg by mouth 5 (five) times daily.   Dexcom G7 Receiver Devi Use to check blood sugar 3 times a day. Dx code e11.65   Dexcom G7 Sensor Misc USE TO CHECK GLUCOSE THREE TIMES DAILY AS DIRECTED, CHANGE SENSOR EVERY 10 DAYS.   doxazosin 4 MG tablet Commonly known as: CARDURA Take 4 mg by mouth 2 (two) times daily.   furosemide 20 MG tablet Commonly known as: LASIX Take 80 mg by mouth 2 (two) times daily as needed for fluid.   glucose blood test strip Commonly known as: Accu-Chek Guide Use as instructed 4 x daily. E11.65   hydrALAZINE 25 MG tablet Commonly known as: APRESOLINE Take 2 tablets (50 mg total) by mouth 3 (three) times daily.   Lantus 100 UNIT/ML injection Generic drug: insulin glargine Inject 30 Units into the skin at bedtime.   latanoprost 0.005 % ophthalmic solution Commonly known as: XALATAN Place 1 drop into both eyes at bedtime.   multivitamin capsule Take 1 capsule by mouth daily.   mycophenolate 360 MG Tbec EC tablet Commonly known as: MYFORTIC Take 720 mg by mouth 2 (two) times daily.   NovoLIN N FlexPen 100 UNIT/ML FlexPen Generic drug: Insulin NPH (Human) (Isophane) Inject 30 Units into the skin 2 (two) times daily before a meal. Takes 30 UNITS SUBCUTANEOUSLY IN THE MORNING with breakfast AND 30 UNITS with SUPPER.   omeprazole 40 MG capsule Commonly known as: PRILOSEC Take 40 mg by mouth every morning.   predniSONE 10 MG tablet Commonly known as: DELTASONE Take 5 mg by mouth 2 (two) times daily with a meal.   sevelamer 800 MG tablet Commonly known  as: RENAGEL Take 800 mg by mouth 3 (three) times daily with meals. Swallow tablet whole do not crush ,break,or chew.   tacrolimus 0.5 MG capsule Commonly known as: PROGRAF Take 3 capsules (1.5 mg total) by mouth 2 (two) times daily. What changed:  medication strength how much to take   Trulicity 3 MG/0.5ML Soaj Generic drug: Dulaglutide Inject 3 mg as directed once a week. What changed: additional instructions   Vitamin D (Ergocalciferol) 1.25 MG (50000 UNIT) Caps capsule Commonly known as: DRISDOL Take 50,000 Units by mouth See admin instructions. 15th of the month        Follow-up Information     Danelle Earthly, MD. Go on 07/02/2023.   Specialty: Infectious Diseases Why: Hospital Follow Up for knee infection Contact information: [address] [place] [address] [phone]         [name], FNP. Schedule an appointment as soon as possible for a visit in 1 week(s).  Specialty: General Practice Why: Hospital Follow Up Contact information: [address] [place] [address] [phone]         Oliver Barre, MD. Schedule an appointment as soon as possible for a visit in 2 week(s).   Specialties: Orthopedic Surgery, Sports Medicine Why: Hospital Follow Up Contact information: [address] [place] [address] [phone]                Allergies  Allergen Reactions   Bactrim [Sulfamethoxazole-Trimethoprim]     Runs pt's sugar and phosphorus   Doxycycline Other (See Comments)    Runs up pt's blood sugar and phosphorus   Flagyl [Metronidazole] Rash    Patient having hives to either the vancomycin or flagyl (both infusing together).   Vancomycin Rash    Patient having hives to either vancomycin or flagyl   Allergies as of 06/12/2023       Reactions   Bactrim [sulfamethoxazole-trimethoprim]    Runs pt's sugar and phosphorus   Doxycycline Other (See Comments)   Runs up pt's blood sugar and phosphorus    Flagyl [metronidazole] Rash   Patient having hives to either the vancomycin or flagyl (both infusing together).   Vancomycin Rash   Patient having hives to either vancomycin or flagyl        Medication List     STOP taking these medications    Azathioprine 75 MG Tabs   gabapentin 300 MG capsule Commonly known as: NEURONTIN   HYDROcodone-acetaminophen 5-325 MG tablet Commonly known as: NORCO/VICODIN   insulin aspart 100 UNIT/ML injection Commonly known as: novoLOG       TAKE these medications    Accu-Chek Guide Me w/Device Kit 1 Piece by Does not apply route as directed.   albuterol 108 (90 Base) MCG/ACT inhaler Commonly known as: VENTOLIN HFA Inhale 2 puffs into the lungs every 6 (six) hours as needed for wheezing or shortness of breath.   atorvastatin 10 MG tablet Commonly known as: LIPITOR Take 1 tablet (10 mg total) by mouth daily.   blood glucose meter kit and supplies Kit Dispense based on patient and insurance preference. Use up to four times daily as directed. (FOR ICD-9 250.00, 250.01).   cloNIDine 0.3 mg/24hr patch Commonly known as: CATAPRES - Dosed in mg/24 hr Place 1 patch (0.3 mg total) onto the skin once a week. What changed: additional instructions   clopidogrel 75 MG tablet Commonly known as: PLAVIX Take 75 mg by mouth daily.   clotrimazole 10 MG troche Commonly known as: MYCELEX Take 10 mg by mouth 5 (five) times daily.   Dexcom G7 Receiver Devi Use to check blood sugar 3 times a day. Dx code e11.65   Dexcom G7 Sensor Misc USE TO CHECK GLUCOSE THREE TIMES DAILY AS DIRECTED, CHANGE SENSOR EVERY 10 DAYS.   doxazosin 4 MG tablet Commonly known as: CARDURA Take 4 mg by mouth 2 (two) times daily.   furosemide 20 MG tablet Commonly known as: LASIX Take 80 mg by mouth 2 (two) times daily as needed for fluid.   glucose blood test strip Commonly known as: Accu-Chek Guide Use as instructed 4 x daily. E11.65   hydrALAZINE 25 MG  tablet Commonly known as: APRESOLINE Take 2 tablets (50 mg total) by mouth 3 (three) times daily.   Lantus 100 UNIT/ML injection Generic drug: insulin glargine Inject 30 Units into the skin at bedtime.   latanoprost 0.005 % ophthalmic solution Commonly known as: XALATAN Place 1 drop into  both eyes at bedtime.   multivitamin capsule Take 1 capsule by mouth daily.   mycophenolate 360 MG Tbec EC tablet Commonly known as: MYFORTIC Take 720 mg by mouth 2 (two) times daily.   NovoLIN N FlexPen 100 UNIT/ML FlexPen Generic drug: Insulin NPH (Human) (Isophane) Inject 30 Units into the skin 2 (two) times daily before a meal. Takes 30 UNITS SUBCUTANEOUSLY IN THE MORNING with breakfast AND 30 UNITS with SUPPER.   omeprazole 40 MG capsule Commonly known as: PRILOSEC Take 40 mg by mouth every morning.   predniSONE 10 MG tablet Commonly known as: DELTASONE Take 5 mg by mouth 2 (two) times daily with a meal.   sevelamer 800 MG tablet Commonly known as: RENAGEL Take 800 mg by mouth 3 (three) times daily with meals. Swallow tablet whole do not crush ,break,or chew.   tacrolimus 0.5 MG capsule Commonly known as: PROGRAF Take 3 capsules (1.5 mg total) by mouth 2 (two) times daily. What changed:  medication strength how much to take   Trulicity 3 MG/0.5ML Soaj Generic drug: Dulaglutide Inject 3 mg as directed once a week. What changed: additional instructions   Vitamin D (Ergocalciferol) 1.25 MG (50000 UNIT) Caps capsule Commonly known as: DRISDOL Take 50,000 Units by mouth See admin instructions. 15th of the month        Procedures/Studies: CT HEAD WO CONTRAST ( ) Result Date: 06/11/2023 CLINICAL DATA:  Sudden onset encephalopathy EXAM: CT HEAD WITHOUT CONTRAST TECHNIQUE: Contiguous axial images were obtained from the base of the skull through the vertex without intravenous contrast. RADIATION DOSE REDUCTION: This exam was performed according to the departmental  dose-optimization program which includes automated exposure control, adjustment of the mA and/or kV according to patient size and/or use of iterative reconstruction technique. COMPARISON:  02/25/2022 FINDINGS: Brain: No mass,hemorrhage or extra-axial collection. Normal appearance of the parenchyma and CSF spaces. Vascular: Atherosclerotic calcification of the vertebral and internal carotid arteries at the skull base. No abnormal hyperdensity of the major intracranial arteries or dural venous sinuses. Skull: The visualized skull base, calvarium and extracranial soft tissues are normal. Sinuses/Orbits: No fluid levels or advanced mucosal thickening of the visualized paranasal sinuses. No mastoid or middle ear effusion. Normal orbits. Other: None. IMPRESSION: No acute intracranial abnormality. Electronically Signed   By: Deatra Robinson M.D.   On: 06/11/2023 00:31   DG Knee Complete 4 Views Right Result Date: 06/07/2023 CLINICAL DATA:  Right knee pain EXAM: RIGHT KNEE - COMPLETE 4+ VIEW COMPARISON:  None Available. FINDINGS: Status post below knee amputation. No fracture or dislocation is seen. Mild tricompartmental degenerative changes, most prominent at the patellofemoral compartment. Moderate suprapatellar knee joint effusion. Vascular calcifications. IMPRESSION: Status post below knee amputation. No fracture or dislocation is seen. Electronically Signed   By: Charline Bills M.D.   On: 06/07/2023 19:20     Subjective: Pt says knee feels a lot better, he is very eager to go home today.    Discharge Exam: Vitals:   06/12/23 1130 06/12/23 1220  BP: 119/63 120/60  Pulse: 69 68  Resp: 14 14  Temp:  98.2 F (36.8 C)  SpO2:  100%   Vitals:   06/12/23 1130 06/12/23 1220 06/12/23 1239 06/12/23 1243  BP: 119/63 120/60    Pulse: 69 68    Resp: 14 14    Temp:  98.2 F (36.8 C)    TempSrc:  Oral    SpO2:  100%    Weight:   97 kg 96 kg  Height:       General exam: Appears calm and comfortable   Respiratory system: Clear to auscultation. Respiratory effort normal. Cardiovascular system: normal S1 & S2 heard. No JVD, murmurs, rubs, gallops or clicks. No pedal edema. Gastrointestinal system: Abdomen is nondistended, soft and nontender. No organomegaly or masses felt. Normal bowel sounds heard. Central nervous system: Alert and oriented. No focal neurological deficits. Extremities: Symmetric 5 x 5 power. Skin: No rashes, lesions or ulcers. Psychiatry: Judgement and insight appear normal. Mood & affect appropriate.   The results of significant diagnostics from this hospitalization (including imaging, microbiology, ancillary and laboratory) are listed below for reference.    Microbiology: Recent Results (from the past 240 hours)  Body fluid culture w Gram Stain     Status: None   Collection Time: 06/07/23  8:34 PM   Specimen: Synovium; Body Fluid  Result Value Ref Range Status   Specimen Description   Final    SYNOVIAL Performed at [facility], [address] [place], [place] [address]    Special Requests   Final    Immunocompromised Performed at [facility], [address] [place], [place] [address]    Gram Stain   Final    MODERATE WBC PRESENT, PREDOMINANTLY PMN NO ORGANISMS SEEN    Culture   Final    NO GROWTH 3 DAYS Performed at [facility], [address] [place], [place] [address]    Report Status 06/11/2023 FINAL  Final  Aerobic/Anaerobic Culture w Gram Stain (surgical/deep wound)     Status: None (Preliminary result)   Collection Time: 06/08/23  6:21 PM   Specimen: PATH Cytology CSF; Body Fluid  Result Value Ref Range Status   Specimen Description   Final    WOUND Performed at [facility], [address], [place], [place] [address]    Special Requests   Final    NONE Performed at [facility], [address], [place], [place] [address]    Gram Stain   Final    MODERATE WBC PRESENT, PREDOMINANTLY PMN NO ORGANISMS SEEN    Culture   Final     NO GROWTH 4 DAYS NO ANAEROBES ISOLATED; CULTURE IN PROGRESS FOR 5 DAYS Performed at [facility], [address], [place], [place] [address]    Report Status PENDING  Incomplete  MRSA Next Gen by PCR, Nasal     Status: None   Collection Time: 06/10/23  5:00 PM   Specimen: Nasal Mucosa; Nasal Swab  Result Value Ref Range Status   MRSA by PCR Next Gen NOT DETECTED NOT DETECTED Final    Comment: (NOTE) The GeneXpert MRSA Assay (FDA approved for NASAL specimens only), is one component of a comprehensive MRSA colonization surveillance program. It is not intended to diagnose MRSA infection nor to guide or monitor treatment for MRSA infections. Test performance is not FDA approved in patients less than 43 years old. Performed at [facility], [address] [place], [place] [address]      Labs: BNP (last 3 results) Recent Labs    06/18/22 1007  BNP 10.3   Basic Metabolic Panel: Recent Labs  Lab 06/07/23 2002 06/08/23 0505 06/10/23 0525 06/10/23 2043  NA 136 135 137 134*  K 4.0 4.0 4.8 4.3  CL 101 99 100 98  CO2 26 26 25 25   GLUCOSE 93 133* 159* 149*  BUN 40* 43* 70* 47*  CREATININE 3.51* 4.06* 5.61* 4.22*  CALCIUM 8.9 8.8* 8.9 8.4*  MG  --  2.1  --  1.8  PHOS  --  5.7*  --  4.9*   Liver Function Tests: Recent Labs  Lab 06/08/23 0505  AST 11*  ALT 14  ALKPHOS 74  BILITOT 0.4  PROT 7.8  ALBUMIN 2.2*   No results for input(s): "LIPASE", "AMYLASE" in the last 168 hours. No results for input(s): "AMMONIA" in the last 168 hours. CBC: Recent Labs  Lab 06/07/23 2002 06/08/23 0505 06/10/23 0525 06/10/23 2043  WBC 6.0 5.6 6.2 8.0  NEUTROABS  --   --  3.8  --   HGB 9.1* 8.5* 8.1* 8.4*  HCT 32.8* 31.1* 29.7* 30.1*  MCV 83.0 83.6 84.6 83.1  PLT 262 270 322 323   Cardiac Enzymes: No results for input(s): "CKTOTAL", "CKMB", "CKMBINDEX", "TROPONINI" in the last 168 hours. BNP: Invalid input(s): "POCBNP" CBG: Recent Labs  Lab 06/11/23 1118 06/11/23 1527  06/11/23 1937 06/12/23 0743 06/12/23 1310  GLUCAP 152* 108* 207* 210* 127*   D-Dimer No results for input(s): "DDIMER" in the last 72 hours. Hgb A1c No results for input(s): "HGBA1C" in the last 72 hours. Lipid Profile No results for input(s): "CHOL", "HDL", "LDLCALC", "TRIG", "CHOLHDL", "LDLDIRECT" in the last 72 hours. Thyroid function studies No results for input(s): "TSH", "T4TOTAL", "T3FREE", "THYROIDAB" in the last 72 hours.  Invalid input(s): "FREET3" Anemia work up No results for input(s): "VITAMINB12", "FOLATE", "FERRITIN", "TIBC", "IRON", "RETICCTPCT" in the last 72 hours. Urinalysis    Component Value Date/Time   COLORURINE RED (A) 03/23/2023 0020   APPEARANCEUR Clear 06/02/2023 1138   LABSPEC 1.004 (L) 03/23/2023 0020   PHURINE 6.0 03/23/2023 0020   GLUCOSEU Trace (A) 06/02/2023 1138   HGBUR LARGE (A) 03/23/2023 0020   BILIRUBINUR Negative 06/02/2023 1138   KETONESUR NEGATIVE 03/23/2023 0020   PROTEINUR 2+ (A) 06/02/2023 1138   PROTEINUR 100 (A) 03/23/2023 0020   UROBILINOGEN 0.2 12/12/2019 1715   NITRITE Negative 06/02/2023 1138   NITRITE NEGATIVE 03/23/2023 0020   LEUKOCYTESUR 1+ (A) 06/02/2023 1138   LEUKOCYTESUR LARGE (A) 03/23/2023 0020   Sepsis Labs Recent Labs  Lab 06/07/23 2002 06/08/23 0505 06/10/23 0525 06/10/23 2043  WBC 6.0 5.6 6.2 8.0   Microbiology Recent Results (from the past 240 hours)  Body fluid culture w Gram Stain     Status: None   Collection Time: 06/07/23  8:34 PM   Specimen: Synovium; Body Fluid  Result Value Ref Range Status   Specimen Description   Final    SYNOVIAL Performed at [facility], [address], [place], [place] [address]    Special Requests   Final    Immunocompromised Performed at [facility], [address], [place], [place] [address]    Gram Stain   Final    MODERATE WBC PRESENT, PREDOMINANTLY PMN NO ORGANISMS SEEN    Culture   Final    NO GROWTH 3 DAYS Performed at [facility], [address] [place], [place] [address]    Report Status 06/11/2023 FINAL  Final  Aerobic/Anaerobic Culture w Gram Stain (surgical/deep wound)     Status: None (Preliminary result)   Collection Time: 06/08/23  6:21 PM   Specimen: PATH Cytology CSF; Body Fluid  Result Value Ref Range Status   Specimen Description   Final    WOUND Performed at [facility], [address] [place], [place] [address]    Special Requests   Final    NONE Performed at [facility],  [address] [place], [place] [address]    Gram Stain   Final    MODERATE WBC PRESENT, PREDOMINANTLY PMN NO ORGANISMS SEEN    Culture   Final    NO GROWTH 4 DAYS NO ANAEROBES ISOLATED; CULTURE IN PROGRESS FOR 5 DAYS Performed at [facility], [address] [place], [place] [address]    Report Status PENDING  Incomplete  MRSA Next Gen by PCR, Nasal     Status: None   Collection Time: 06/10/23  5:00 PM   Specimen: Nasal Mucosa; Nasal Swab  Result Value Ref Range Status   MRSA by PCR Next Gen NOT DETECTED NOT DETECTED Final    Comment: (NOTE) The GeneXpert MRSA Assay (FDA approved for NASAL specimens only), is one component of a comprehensive MRSA colonization surveillance program. It is not intended to diagnose MRSA infection nor to guide or monitor treatment for MRSA infections. Test performance is not FDA approved in patients less than 86 years old. Performed at [facility], [address], [place], [place] [address]    Time coordinating discharge:  42 mins   SIGNED:  Standley Dakins, MD  [facility] 06/12/2023, 1:13 PM How to contact the [facility] Attending or Consulting provider 7A - 7P or covering provider during after hours 7P -7A, for this patient?  Check the care team in [facility] and look for a) attending/consulting [facility] provider listed and b) the [facility] team listed Log into [email or web address] and use [facility] universal password to access. If you do not have the password, please contact the hospital  operator. Locate the [facility] provider you are looking for under [facility] and page to a number that you can be directly reached. If you still have difficulty reaching the provider, please page the [facility] (Director on Call) for the Hospitalists listed on amion for assistance.

## 2023-06-12 NOTE — Progress Notes (Signed)
NURSING PROGRESS NOTE  Raymond [name] [id number] Discharge Data: 06/12/2023 1:57 PM Attending Provider: Cleora Fleet, MD [id number], Lolita Cram, FNP     Ceasar Mons to be D/C'd Home per MD order.  Discussed with the patient the After Visit Summary and all questions fully answered. All IV's discontinued with no bleeding noted. All belongings returned to patient for patient to take home.   Last Vital Signs:  Blood pressure (!) 113/59, pulse 80, temperature 98.2 F (36.8 C), temperature source Oral, resp. rate 16, height 5\' 9"  (1.753 m), weight 96 kg, SpO2 99%.  Discharge Medication List Allergies as of 06/12/2023       Reactions   Bactrim [sulfamethoxazole-trimethoprim]    Runs pt's sugar and phosphorus   Doxycycline Other (See Comments)   Runs up pt's blood sugar and phosphorus   Flagyl [metronidazole] Rash   Patient having hives to either the vancomycin or flagyl (both infusing together).   Vancomycin Rash   Patient having hives to either vancomycin or flagyl        Medication List     STOP taking these medications    Azathioprine 75 MG Tabs   gabapentin 300 MG capsule Commonly known as: NEURONTIN   HYDROcodone-acetaminophen 5-325 MG tablet Commonly known as: NORCO/VICODIN   insulin aspart 100 UNIT/ML injection Commonly known as: novoLOG       TAKE these medications    Accu-Chek Guide Me w/Device Kit 1 Piece by Does not apply route as directed.   albuterol 108 (90 Base) MCG/ACT inhaler Commonly known as: VENTOLIN HFA Inhale 2 puffs into the lungs every 6 (six) hours as needed for wheezing or shortness of breath.   atorvastatin 10 MG tablet Commonly known as: LIPITOR Take 1 tablet (10 mg total) by mouth daily.   blood glucose meter kit and supplies Kit Dispense based on patient and insurance preference. Use up to four times daily as directed. (FOR ICD-9 250.00, 250.01).   cloNIDine 0.3 mg/24hr patch Commonly known as: CATAPRES -  Dosed in mg/24 hr Place 1 patch (0.3 mg total) onto the skin once a week. What changed: additional instructions   clopidogrel 75 MG tablet Commonly known as: PLAVIX Take 75 mg by mouth daily.   clotrimazole 10 MG troche Commonly known as: MYCELEX Take 10 mg by mouth 5 (five) times daily.   Dexcom G7 Receiver Devi Use to check blood sugar 3 times a day. Dx code e11.65   Dexcom G7 Sensor Misc USE TO CHECK GLUCOSE THREE TIMES DAILY AS DIRECTED, CHANGE SENSOR EVERY 10 DAYS.   doxazosin 4 MG tablet Commonly known as: CARDURA Take 4 mg by mouth 2 (two) times daily.   furosemide 20 MG tablet Commonly known as: LASIX Take 80 mg by mouth 2 (two) times daily as needed for fluid.   glucose blood test strip Commonly known as: Accu-Chek Guide Use as instructed 4 x daily. E11.65   hydrALAZINE 25 MG tablet Commonly known as: APRESOLINE Take 2 tablets (50 mg total) by mouth 3 (three) times daily.   Lantus 100 UNIT/ML injection Generic drug: insulin glargine Inject 30 Units into the skin at bedtime.   latanoprost 0.005 % ophthalmic solution Commonly known as: XALATAN Place 1 drop into both eyes at bedtime.   multivitamin capsule Take 1 capsule by mouth daily.   mycophenolate 360 MG Tbec EC tablet Commonly known as: MYFORTIC Take 720 mg by mouth 2 (two) times daily.   NovoLIN N FlexPen 100 UNIT/ML FlexPen Generic drug: Insulin  NPH (Human) (Isophane) Inject 30 Units into the skin 2 (two) times daily before a meal. Takes 30 UNITS SUBCUTANEOUSLY IN THE MORNING with breakfast AND 30 UNITS with SUPPER.   omeprazole 40 MG capsule Commonly known as: PRILOSEC Take 40 mg by mouth every morning.   predniSONE 10 MG tablet Commonly known as: DELTASONE Take 5 mg by mouth 2 (two) times daily with a meal.   sevelamer 800 MG tablet Commonly known as: RENAGEL Take 800 mg by mouth 3 (three) times daily with meals. Swallow tablet whole do not crush ,break,or chew.   tacrolimus 0.5 MG  capsule Commonly known as: PROGRAF Take 3 capsules (1.5 mg total) by mouth 2 (two) times daily. What changed:  medication strength how much to take   Trulicity 3 MG/0.5ML Soaj Generic drug: Dulaglutide Inject 3 mg as directed once a week. What changed: additional instructions   Vitamin D (Ergocalciferol) 1.25 MG (50000 UNIT) Caps capsule Commonly known as: DRISDOL Take 50,000 Units by mouth See admin instructions. 15th of the month         Roma Kayser, RN

## 2023-06-12 NOTE — Progress Notes (Incomplete)
PHARMACY CONSULT NOTE FOR:  OUTPATIENT  PARENTERAL ANTIBIOTIC THERAPY (OPAT)  Informational as the patient will be receiving antibiotics outpatient at his HD center. Plan discussed with nephrology team.   Indication: Septic knee Regimen: Vancomycin 1g/HD-TTS + *** Cefepime or Ceftazidime 2g/HD-TTS End date: 07/05/23 (4 weeks from OR 06/08/23)  IV antibiotic discharge orders are pended. To discharging provider:  please sign these orders via discharge navigator,  Select New Orders & click on the button choice - Manage This Unsigned Work.    Thank you for allowing pharmacy to be a part of this patient's care.  Georgina Pillion, PharmD, BCPS, BCIDP Infectious Diseases Clinical Pharmacist 06/12/2023 1:16 PM   **Pharmacist phone directory can now be found on [email or web address] ([email or web address]).  Listed under [facility].

## 2023-06-12 NOTE — TOC Transition Note (Signed)
Transition of Care [facility]) - Discharge Note   Patient Details  Name: Raymond [name] MRN: [id number] Date of Birth: [birth year]  [facility]) CM/SW Contact:  Leitha Bleak, RN Phone Number: 06/12/2023, 2:03 PM   Clinical Narrative:   Patient discharging home. Patient will need antibiotics with HD.  CM called [facility] to update them with discharge plan. Discharge summary printed and faxed to them at [phone]. They are aware he will be there tomorrow on schedule with new meds.     Final next level of care: Home/Self Care Barriers to Discharge: Barriers Resolved   Patient Goals and CMS Choice Patient states their goals for this hospitalization and ongoing recovery are:: to go home CMS Medicare.gov Compare Post Acute Care list provided to:: Patient Choice offered to / list presented to : Patient    Discharge Placement            Discharge Plan and Services Additional resources added to the After Visit Summary for       Social Drivers of Health (SDOH) Interventions SDOH Screenings   Food Insecurity: Patient Declined (06/09/2023)  Housing: Low Risk  (06/08/2023)  Transportation Needs: No Transportation Needs (06/08/2023)  Utilities: Not At Risk (06/08/2023)  Depression (PHQ2-9): Low Risk  (10/16/2022)  Tobacco Use: Low Risk  (06/08/2023)     Readmission Risk Interventions    06/09/2023    4:26 PM 06/08/2023    4:25 PM 06/08/2023   11:54 AM  Readmission Risk Prevention Plan  Transportation Screening Complete Complete Complete  HRI or Home Care Consult Complete Complete Complete  Social Work Consult for Recovery Care Planning/Counseling Complete Complete Complete  Palliative Care Screening Not Applicable Not Applicable Not Applicable  Medication Review [employer]) Complete Complete Complete

## 2023-06-13 LAB — AEROBIC/ANAEROBIC CULTURE W GRAM STAIN (SURGICAL/DEEP WOUND): Culture: NO GROWTH

## 2023-06-15 ENCOUNTER — Telehealth: Payer: Self-pay

## 2023-06-15 NOTE — Transitions of Care (Post Inpatient/ED Visit) (Signed)
   06/15/2023  Name: Raymond [name] MRN: [id number] DOB: 02-Jun-1960  Today's TOC FU Call Status: Today's TOC FU Call Status:: Successful TOC FU Call Completed TOC FU Call Complete Date: 06/15/23 Patient's Name and Date of Birth confirmed.  Transition Care Management Follow-up Telephone Call Date of Discharge: 06/08/23 [facility] Type of Discharge: Inpatient Admission Primary Inpatient Discharge Diagnosis:: KNEE ARTHROTOMY How have you been since you were released from the hospital?: Better Any questions or concerns?: No  Items Reviewed: Did you receive and understand the discharge instructions provided?: Yes Medications obtained,verified, and reconciled?: Yes (Medications Reviewed) Any new allergies since your discharge?: No Dietary orders reviewed?: No Do you have support at home?: Yes People in Home: spouse  Medications Reviewed Today: Medications Reviewed Today   Medications were not reviewed in this encounter     Home Care and Equipment/Supplies: Were Home Health Services Ordered?: No Any new equipment or medical supplies ordered?: No  Functional Questionnaire: Do you need assistance with bathing/showering or dressing?: Yes Do you need assistance with meal preparation?: Yes Do you need assistance with eating?: Yes Do you have difficulty maintaining continence: No Do you need assistance with getting out of bed/getting out of a chair/moving?: Yes Do you have difficulty managing or taking your medications?: Yes  Follow up appointments reviewed: PCP Follow-up appointment confirmed?: Yes Date of PCP follow-up appointment?: 06/18/23 Follow-up Provider: [facility] Follow-up appointment confirmed?: No Reason Specialist Follow-Up Not Confirmed: Appointment Sceduled by [facility] Calling Clinician Do you need transportation to your follow-up appointment?: No Do you understand care options if your condition(s) worsen?: Yes-patient  verbalized understanding    SIGNATURE Lisabeth Devoid, CMA

## 2023-06-16 ENCOUNTER — Telehealth: Payer: Self-pay | Admitting: Orthopedic Surgery

## 2023-06-16 NOTE — Telephone Encounter (Signed)
Dr. Dallas Schimke pt - spoke w/the pt, he is wanting to know if he can put weight on his rt knee now. [phone]

## 2023-06-17 NOTE — Telephone Encounter (Signed)
L detailed VM w/ call back information in case of questions/concerns.

## 2023-06-18 ENCOUNTER — Telehealth: Payer: Self-pay | Admitting: Nurse Practitioner

## 2023-06-18 ENCOUNTER — Encounter: Payer: Self-pay | Admitting: Nurse Practitioner

## 2023-06-18 ENCOUNTER — Ambulatory Visit: Payer: Self-pay | Admitting: Nurse Practitioner

## 2023-06-18 ENCOUNTER — Other Ambulatory Visit: Payer: Self-pay | Admitting: Cardiovascular Disease

## 2023-06-18 DIAGNOSIS — Z89511 Acquired absence of right leg below knee: Secondary | ICD-10-CM

## 2023-06-18 DIAGNOSIS — Z09 Encounter for follow-up examination after completed treatment for conditions other than malignant neoplasm: Secondary | ICD-10-CM

## 2023-06-18 DIAGNOSIS — M009 Pyogenic arthritis, unspecified: Secondary | ICD-10-CM | POA: Diagnosis not present

## 2023-06-18 NOTE — Patient Instructions (Addendum)
Dr. Thedore Mins (Infectious Disease) appt on 07/02/23 at 130pm Address: [address], [place], [place] [address]  Phone: [phone]

## 2023-06-18 NOTE — Progress Notes (Signed)
Virtual Visit via Video Note  Madelaine Bhat, CMA,acting as a scribe for [name], FNP.,have documented all relevant documentation on the behalf of [name], FNP,as directed by  [name], FNP while in the presence of [name], FNP.  I connected with Raymond [name] on 06/18/23 at 10:20 AM EST by a video enabled telemedicine application and verified that I am speaking with the correct person using two identifiers.  Patient Location: Home Provider Location: Office/Clinic  I discussed the limitations, risks, security, and privacy concerns of performing an evaluation and management service by video and the availability of in person appointments. I also discussed with the patient that there may be a patient responsible charge related to this service. The patient expressed understanding and agreed to proceed.  Subjective: PCP: [name], FNP  No chief complaint on file.  Patient presents today for a hospital follow up, Patient reports compliance with medication. Patient denies any chest pain, SOB, or headaches. He presented to the ER with a one month history of right knee pain. 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 Patient reports he is feeling better now still a little knee pain. Patient would like to physical therapist preferably at [facility]. Patient reports pain in his knee where his stitches.   His prograf and myfortic for his kidney dose was decreased by one tab twice a day. He is scheduled to see the ID (2/6) and orthopedic (1/28).   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 When he had a biopsy the main artery was bleeding and caused the kidney to bleed. He is going for labs every week. He is back on the dialysis machine for the last 2 months. 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 He is on antibiotics for his knee until Feb 9th. He will go back to [place] after to monitor after his  antibiotics. He is back in his chair but is walking a little in the house.        ROS: Per HPI  Current Outpatient Medications:    albuterol (VENTOLIN HFA) 108 (90 Base) MCG/ACT inhaler, Inhale 2 puffs into the lungs every 6 (six) hours as needed for wheezing or shortness of breath., Disp: , Rfl:    atorvastatin (LIPITOR) 10 MG tablet, Take 1 tablet (10 mg total) by mouth daily., Disp: 30 tablet, Rfl: 0   blood glucose meter kit and supplies KIT, Dispense based on patient and insurance preference. Use up to four times daily as directed. (FOR ICD-9 250.00, 250.01)., Disp: 1 each, Rfl: 0   Blood Glucose Monitoring Suppl (ACCU-CHEK GUIDE ME) w/Device KIT, 1 Piece by Does not apply route as directed., Disp: 1 kit, Rfl: 0   cloNIDine (CATAPRES - DOSED IN MG/24 HR) 0.3 mg/24hr patch, Place 1 patch (0.3 mg total) onto the skin once a week. (Patient taking differently: Place 0.3 mg onto the skin once a week. Place patch on Sunday), Disp: 4 patch, Rfl: 12   clopidogrel (PLAVIX) 75 MG tablet, Take 75 mg by mouth daily., Disp: , Rfl:    clotrimazole (MYCELEX) 10 MG troche, Take 10 mg by mouth 5 (five) times daily., Disp: , Rfl:    Continuous Blood Gluc Receiver (DEXCOM G7 RECEIVER) DEVI, Use to check blood sugar 3 times a day. Dx code e11.65, Disp: 1 each, Rfl: 1   Continuous Glucose Sensor (DEXCOM G7 SENSOR) MISC, USE TO CHECK GLUCOSE THREE TIMES DAILY AS  DIRECTED, CHANGE SENSOR EVERY 10 DAYS., Disp: 3 each, Rfl: 5   doxazosin (CARDURA) 4 MG tablet, Take 4 mg by mouth 2 (two) times daily., Disp: , Rfl:    Dulaglutide (TRULICITY) 3 MG/0.5ML SOPN, Inject 3 mg as directed once a week. (Patient taking differently: Inject 3 mg as directed once a week. Takes on Sunday), Disp: 6 mL, Rfl: 4   furosemide (LASIX) 20 MG tablet, Take 80 mg by mouth 2 (two) times daily as needed for fluid., Disp: , Rfl:    glucose blood (ACCU-CHEK GUIDE) test strip, Use as instructed 4 x daily. E11.65, Disp: 150 each, Rfl: 5    hydrALAZINE (APRESOLINE) 25 MG tablet, Take 2 tablets (50 mg total) by mouth 3 (three) times daily., Disp: , Rfl:    insulin glargine (LANTUS) 100 UNIT/ML injection, Inject 30 Units into the skin at bedtime., Disp: , Rfl:    latanoprost (XALATAN) 0.005 % ophthalmic solution, Place 1 drop into both eyes at bedtime., Disp: , Rfl:    Multiple Vitamin (MULTIVITAMIN) capsule, Take 1 capsule by mouth daily., Disp: , Rfl:    mycophenolate (MYFORTIC) 360 MG TBEC EC tablet, Take 720 mg by mouth 2 (two) times daily., Disp: , Rfl:    NOVOLIN N FLEXPEN 100 UNIT/ML FlexPen, Inject 30 Units into the skin 2 (two) times daily before a meal. Takes 30 UNITS SUBCUTANEOUSLY IN THE MORNING with breakfast AND 30 UNITS with SUPPER., Disp: , Rfl:    omeprazole (PRILOSEC) 40 MG capsule, Take 40 mg by mouth every morning., Disp: , Rfl:    predniSONE (DELTASONE) 10 MG tablet, Take 5 mg by mouth 2 (two) times daily with a meal., Disp: , Rfl:    sevelamer (RENAGEL) 800 MG tablet, Take 800 mg by mouth 3 (three) times daily with meals. Swallow tablet whole do not crush ,break,or chew., Disp: , Rfl:    tacrolimus (PROGRAF) 0.5 MG capsule, Take 3 capsules (1.5 mg total) by mouth 2 (two) times daily., Disp: , Rfl:    Vitamin D, Ergocalciferol, (DRISDOL) 50000 units CAPS capsule, Take 50,000 Units by mouth See admin instructions. 15th of the month, Disp: , Rfl:   Observations/Objective: There were no vitals filed for this visit. Physical Exam Vitals reviewed.  Constitutional:      General: He is not in acute distress.    Appearance: Normal appearance. He is obese.  Pulmonary:     Effort: Pulmonary effort is normal. No respiratory distress.  Skin:    General: Skin is warm and dry.     Capillary Refill: Capillary refill takes less than 2 seconds.  Neurological:     General: No focal deficit present.     Mental Status: He is alert and oriented to person, place, and time.     Cranial Nerves: No cranial nerve deficit.      Motor: No weakness.     Assessment and Plan: Infectious arthritis [facility]) Assessment & Plan: TCM Performed. A member of the clinical team spoke with the patient upon dischare. Discharge summary was reviewed in full detail during the visit. Meds reconciled and compared to discharge meds. Medication list is updated and reviewed with the patient.  Greater than 50% face to face time was spent in counseling an coordination of care.  All questions were answered to the satisfaction of the patient.   He is to f/u with ID and Ortho. Continue antibiotics given at Dialysis until February.  Overall he is feeling better and starting to put weight on his  thigh.   Orders: -     Ambulatory referral to Physical Therapy  Hospital discharge follow-up  History of right below knee amputation (HCC)    Follow Up Instructions: No follow-ups on file.   I discussed the assessment and treatment plan with the patient. The patient was provided an opportunity to ask questions, and all were answered. The patient agreed with the plan and demonstrated an understanding of the instructions.   The patient was advised to call back or seek an in-person evaluation if the symptoms worsen or if the condition fails to improve as anticipated.  The above assessment and management plan was discussed with the patient. The patient verbalized understanding of and has agreed to the management plan.   Jeanell Sparrow, FNP, have reviewed all documentation for this visit. The documentation on 06/18/23 for the exam, diagnosis, procedures, and orders are all accurate and complete.

## 2023-06-18 NOTE — Assessment & Plan Note (Signed)
TCM Performed. A member of the clinical team spoke with the patient upon dischare. Discharge summary was reviewed in full detail during the visit. Meds reconciled and compared to discharge meds. Medication list is updated and reviewed with the patient.  Greater than 50% face to face time was spent in counseling an coordination of care.  All questions were answered to the satisfaction of the patient.   He is to f/u with ID and Ortho. Continue antibiotics given at Dialysis until February.  Overall he is feeling better and starting to put weight on his thigh.

## 2023-06-18 NOTE — Addendum Note (Signed)
Addended by: [name] F on: 06/18/2023 10:49 AM   Modules accepted: Level of Service

## 2023-06-23 ENCOUNTER — Other Ambulatory Visit: Payer: Self-pay

## 2023-06-23 ENCOUNTER — Ambulatory Visit: Payer: BLUE CROSS/BLUE SHIELD | Admitting: Orthopedic Surgery

## 2023-06-23 ENCOUNTER — Encounter: Payer: Self-pay | Admitting: Orthopedic Surgery

## 2023-06-23 DIAGNOSIS — M009 Pyogenic arthritis, unspecified: Secondary | ICD-10-CM

## 2023-06-23 MED ORDER — EMPAGLIFLOZIN 10 MG PO TABS: 10.0000 mg | ORAL_TABLET | Freq: Every day | ORAL | 0 refills | Status: DC

## 2023-06-23 NOTE — Progress Notes (Signed)
Orthopaedic Postop Note  Assessment: Raymond [name] is a 63 y.o. male s/p right knee arthrotomy for septic arthritis  DOS: 06/08/2023  Plan: [name], is recovering appropriately.  Sutures were removed in clinic today.  No restrictions.  Continue to take antibiotics at the direction of infectious disease.  If he continues to have issues, he will contact the clinic.  Otherwise, follow-up as needed.    Follow-up: Return if symptoms worsen or fail to improve. XR at next visit: None  Subjective:  Chief Complaint  Patient presents with   Routine Post Op    Suture removal right knee     History of Present Illness: Raymond [name] is a 63 y.o. male who presents following the above stated procedure.  Surgery was approximately 2 weeks ago.  He has done well.  He has been discharged from the hospital, and has returned home.  He is able to bear some weight using his prosthesis.  He denies fevers or chills.  He is still taking antibiotics.  Review of Systems: No fevers or chills No numbness or tingling No Chest Pain No shortness of breath   Objective: There were no vitals taken for this visit.  Physical Exam:  Anterior right knee surgical incisions are healing.  No surrounding erythema or drainage.  No redness.  He is able to extend his knee.  He tolerates flexion to approximately 90 degrees.  No tenderness to palpation.  No effusion.  IMAGING: I personally ordered and reviewed the following images:   Raymond Barre, MD 06/23/2023 3:12 PM

## 2023-07-02 ENCOUNTER — Inpatient Hospital Stay: Payer: Self-pay | Admitting: Internal Medicine

## 2023-07-02 ENCOUNTER — Encounter: Payer: BLUE CROSS/BLUE SHIELD | Admitting: Nurse Practitioner

## 2023-07-02 ENCOUNTER — Other Ambulatory Visit: Payer: Self-pay

## 2023-07-02 ENCOUNTER — Telehealth: Payer: Self-pay

## 2023-07-02 NOTE — Telephone Encounter (Signed)
 Patient called to reschedule appt due to insurance. He is currently covered with [employer], but it is an [employer]. He wanted to push out a little to figure out his options with this.   I did offer appts earlier within the next few weeks, but pt insisted to see Dr. Dennise and stated that he is doing just fine and can see her on her next March appt. He is currently scheduled for 08/24/2023. I did state that if anything is to change or is he is to have any concerns to reach out to us  and we can see about moving his appt sooner.

## 2023-08-24 ENCOUNTER — Inpatient Hospital Stay: Payer: BLUE CROSS/BLUE SHIELD | Admitting: Internal Medicine

## 2023-09-01 ENCOUNTER — Emergency Department (HOSPITAL_COMMUNITY)

## 2023-09-01 ENCOUNTER — Encounter (HOSPITAL_COMMUNITY): Payer: Self-pay | Admitting: *Deleted

## 2023-09-01 ENCOUNTER — Other Ambulatory Visit: Payer: Self-pay

## 2023-09-01 ENCOUNTER — Emergency Department (HOSPITAL_COMMUNITY)
Admission: EM | Admit: 2023-09-01 | Discharge: 2023-09-01 | Disposition: A | Discharge status: Home / Self Care | Attending: Emergency Medicine | Admitting: Emergency Medicine

## 2023-09-01 DIAGNOSIS — Z7902 Long term (current) use of antithrombotics/antiplatelets: Secondary | ICD-10-CM | POA: Insufficient documentation

## 2023-09-01 DIAGNOSIS — Z794 Long term (current) use of insulin: Secondary | ICD-10-CM | POA: Diagnosis not present

## 2023-09-01 DIAGNOSIS — R0781 Pleurodynia: Secondary | ICD-10-CM | POA: Insufficient documentation

## 2023-09-01 DIAGNOSIS — I10 Essential (primary) hypertension: Secondary | ICD-10-CM | POA: Insufficient documentation

## 2023-09-01 DIAGNOSIS — E119 Type 2 diabetes mellitus without complications: Secondary | ICD-10-CM | POA: Diagnosis not present

## 2023-09-01 DIAGNOSIS — Y92009 Unspecified place in unspecified non-institutional (private) residence as the place of occurrence of the external cause: Secondary | ICD-10-CM | POA: Insufficient documentation

## 2023-09-01 DIAGNOSIS — R0789 Other chest pain: Secondary | ICD-10-CM

## 2023-09-01 DIAGNOSIS — W010XXA Fall on same level from slipping, tripping and stumbling without subsequent striking against object, initial encounter: Secondary | ICD-10-CM | POA: Diagnosis not present

## 2023-09-01 MED ORDER — OXYCODONE-ACETAMINOPHEN 5-325 MG PO TABS: 1.0000 | ORAL_TABLET | Freq: Four times a day (QID) | ORAL | 0 refills | Status: DC | PRN

## 2023-09-01 MED ORDER — ACETAMINOPHEN 325 MG PO TABS
650.0000 mg | ORAL_TABLET | Freq: Once | ORAL | Status: AC
Start: 2023-09-01 — End: 2023-09-01
  Administered 2023-09-01: 650 mg via ORAL
  Filled 2023-09-01: qty 2

## 2023-09-01 MED ORDER — OXYCODONE-ACETAMINOPHEN 5-325 MG PO TABS
1.0000 | ORAL_TABLET | Freq: Once | ORAL | Status: AC
  Administered 2023-09-01: 1 via ORAL
  Filled 2023-09-01: qty 1

## 2023-09-01 MED ORDER — HYDRALAZINE HCL 25 MG PO TABS
50.0000 mg | ORAL_TABLET | Freq: Once | ORAL | Status: AC
  Administered 2023-09-01: 50 mg via ORAL
  Filled 2023-09-01: qty 2

## 2023-09-01 NOTE — ED Triage Notes (Signed)
 Pt in c/o fall today about an hour ago after he tripped rug at home and fell to the ground and did a flip, pt denies LOC, pt c/o abdominal pain since fall, does not take blood thinners, pt has R BKA previously with prosthetic at baseline, A& O x 4

## 2023-09-01 NOTE — ED Provider Notes (Signed)
 [place] EMERGENCY DEPARTMENT AT [facility] Provider Note   CSN: [id number] Arrival date & time: 09/01/23  1402     History  Chief Complaint  Patient presents with   Raymond [name] is a 63 y.o. male with a history including hypertension, diabetes, renal insufficiency presenting for evaluation of bilateral anterior rib cage pain after tripping and falling at his home.  He tripped over a rug in his yard, states he "did a flip" and landed on his stomach causing pain to his lower ribs bilaterally.  This occurred about an hour before arrival.  He denies head injury, denies abdominal pain, nausea or vomiting, denies any pain in his extremities.  He has had no treatment prior to arrival, denies LOC.  The history is provided by the patient and the spouse.       Home Medications Prior to Admission medications   Medication Sig Start Date End Date Taking? Authorizing Provider  oxyCODONE-acetaminophen (PERCOCET/ROXICET) 5-325 MG tablet Take 1 tablet by mouth every 6 (six) hours as needed for severe pain (pain score 7-10). 09/01/23  Yes Shalise Rosado, Raynelle Fanning, PA-C  oxyCODONE-acetaminophen (PERCOCET/ROXICET) 5-325 MG tablet Take 1 tablet by mouth every 6 (six) hours as needed for severe pain (pain score 7-10). 09/01/23  Yes Rufus Cypert, Raynelle Fanning, PA-C  albuterol (VENTOLIN HFA) 108 (90 Base) MCG/ACT inhaler Inhale 2 puffs into the lungs every 6 (six) hours as needed for wheezing or shortness of breath. Patient not taking: Reported on 06/23/2023 05/18/23   [provider]  atorvastatin (LIPITOR) 10 MG tablet Take 1 tablet by mouth once daily 06/19/23   Runell Gess, MD  blood glucose meter kit and supplies KIT Dispense based on patient and insurance preference. Use up to four times daily as directed. (FOR ICD-9 250.00, 250.01). 04/11/20   Roma Kayser, MD  Blood Glucose Monitoring Suppl (ACCU-CHEK GUIDE ME) w/Device KIT 1 Piece by Does not apply route as directed. 02/18/18    Roma Kayser, MD  cloNIDine (CATAPRES - DOSED IN MG/24 HR) 0.3 mg/24hr patch Place 1 patch (0.3 mg total) onto the skin once a week. Patient taking differently: Place 0.3 mg onto the skin once a week. Place patch on Sunday 08/21/21   Runell Gess, MD  clopidogrel (PLAVIX) 75 MG tablet Take 75 mg by mouth daily. 04/09/23   [provider]  clotrimazole (MYCELEX) 10 MG troche Take 10 mg by mouth 5 (five) times daily.    [provider]  Continuous Blood Gluc Receiver (DEXCOM G7 RECEIVER) DEVI Use to check blood sugar 3 times a day. Dx code e11.65 12/16/21   [name], FNP  Continuous Glucose Sensor (DEXCOM G7 SENSOR) MISC USE TO CHECK GLUCOSE THREE TIMES DAILY AS DIRECTED, CHANGE SENSOR EVERY 10 DAYS. 02/03/23   Thapa, [place], MD  doxazosin (CARDURA) 4 MG tablet Take 4 mg by mouth 2 (two) times daily. 02/02/23   [provider]  Dulaglutide (TRULICITY) 3 MG/0.5ML SOPN Inject 3 mg as directed once a week. Patient taking differently: Inject 3 mg as directed once a week. Takes on Sunday 01/27/23   Thapa, [place], MD  empagliflozin (JARDIANCE) 10 MG TABS tablet Take 1 tablet (10 mg total) by mouth daily before breakfast. 06/23/23   [name], FNP  furosemide (LASIX) 20 MG tablet Take 80 mg by mouth 2 (two) times daily as needed for fluid. 04/23/22   [provider]  glucose blood (ACCU-CHEK GUIDE) test strip Use as instructed 4  x daily. E11.65 02/19/18   Roma Kayser, MD  hydrALAZINE (APRESOLINE) 25 MG tablet Take 2 tablets (50 mg total) by mouth 3 (three) times daily. 06/12/23   Johnson, Clanford L, MD  latanoprost (XALATAN) 0.005 % ophthalmic solution Place 1 drop into both eyes at bedtime. 07/25/21   [provider]  Multiple Vitamin (MULTIVITAMIN) capsule Take 1 capsule by mouth daily.    [provider]  mycophenolate (MYFORTIC) 360 MG TBEC EC tablet Take 720 mg by mouth 2 (two) times daily. 03/19/23   [provider]   NOVOLIN N FLEXPEN 100 UNIT/ML FlexPen Inject 30 Units into the skin 2 (two) times daily before a meal. Takes 30 UNITS SUBCUTANEOUSLY IN THE MORNING with breakfast AND 30 UNITS with SUPPER. 06/12/23   Johnson, Clanford L, MD  omeprazole (PRILOSEC) 40 MG capsule Take 40 mg by mouth every morning. 03/31/23   [provider]  predniSONE (DELTASONE) 10 MG tablet Take 5 mg by mouth 2 (two) times daily with a meal.    [provider]  sevelamer (RENAGEL) 800 MG tablet Take 800 mg by mouth 3 (three) times daily with meals. Swallow tablet whole do not crush ,break,or chew.    [provider]  tacrolimus (PROGRAF) 0.5 MG capsule Take 3 capsules (1.5 mg total) by mouth 2 (two) times daily. Patient not taking: Reported on 06/23/2023 06/12/23   Cleora Fleet, MD  Vitamin D, Ergocalciferol, (DRISDOL) 50000 units CAPS capsule Take 50,000 Units by mouth See admin instructions. 15th of the month    [provider]      Allergies    Bactrim [sulfamethoxazole-trimethoprim], Doxycycline, Flagyl [metronidazole], and Vancomycin    Review of Systems   Review of Systems  Constitutional:  Negative for fever.  HENT:  Negative for congestion.   Eyes: Negative.   Respiratory:  Negative for chest tightness and shortness of breath.   Cardiovascular:  Positive for chest pain.  Gastrointestinal:  Negative for abdominal pain, nausea and vomiting.  Genitourinary: Negative.   Musculoskeletal:  Negative for arthralgias, joint swelling and neck pain.  Skin: Negative.  Negative for rash and wound.  Neurological:  Negative for dizziness, weakness, light-headedness, numbness and headaches.  Psychiatric/Behavioral: Negative.      Physical Exam Updated Vital Signs BP (!) 166/82   Pulse 73   Temp 98.3 F (36.8 C) (Oral)   Resp 15   Ht 5\' 7"  (1.702 m)   Wt 97.5 kg   SpO2 97%   BMI 33.67 kg/m  Physical Exam Vitals and nursing note reviewed.  Constitutional:      Appearance: He is  well-developed.  HENT:     Head: Normocephalic and atraumatic.  Eyes:     Conjunctiva/sclera: Conjunctivae normal.  Cardiovascular:     Rate and Rhythm: Normal rate and regular rhythm.     Heart sounds: Normal heart sounds.  Pulmonary:     Effort: Pulmonary effort is normal.     Breath sounds: Normal breath sounds. No wheezing.  Chest:     Chest wall: Tenderness present.     Comments: Patient is tender to palpation along his mid to lower anterior ribs without palpable deformity or crepitus.  Pain is worsened with inspiration.  He has no abdominal pain. Abdominal:     General: Bowel sounds are normal.     Palpations: Abdomen is soft.     Tenderness: There is no abdominal tenderness.  Musculoskeletal:        General: Normal range of  motion.     Cervical back: Normal range of motion.  Skin:    General: Skin is warm and dry.  Neurological:     Mental Status: He is alert.     ED Results / Procedures / Treatments   Labs (all labs ordered are listed, but only abnormal results are displayed) Labs Reviewed - No data to display  EKG None  Radiology CT Chest Wo Contrast Result Date: 09/01/2023 CLINICAL DATA:  Fall abdominal pain EXAM: CT CHEST WITHOUT CONTRAST TECHNIQUE: Multidetector CT imaging of the chest was performed following the standard protocol without IV contrast. RADIATION DOSE REDUCTION: This exam was performed according to the departmental dose-optimization program which includes automated exposure control, adjustment of the mA and/or kV according to patient size and/or use of iterative reconstruction technique. COMPARISON:  Chest x-ray 09/01/2023, CT chest 03/24/2023 FINDINGS: Cardiovascular: Limited evaluation without intravenous contrast. Mild aortic atherosclerosis. No aneurysm. Multi vessel coronary vascular calcification. Cardiomegaly. Trace pericardial effusion Mediastinum/Nodes: Patent trachea. No thyroid mass. No suspicious lymph nodes. Esophagus within normal limits.  Lungs/Pleura: No consolidation, pleural effusion or pneumothorax. Bilateral bronchial wall thickening with scattered peribronchovascular ground-glass opacity. Upper Abdomen: Extensive vascular disease.  No acute finding. Musculoskeletal: No acute osseous abnormality.  Gynecomastia IMPRESSION: 1. Negative for acute intrathoracic traumatic injury. 2. Diffuse bilateral bronchial wall thickening with scattered areas of peribronchovascular ground-glass density, correlate for any history of respiratory infection Aortic Atherosclerosis (ICD10-I70.0). Electronically Signed   By: Jasmine Pang M.D.   On: 09/01/2023 22:36   DG Chest 2 View Result Date: 09/01/2023 CLINICAL DATA:  Fall today. EXAM: CHEST - 2 VIEW COMPARISON:  May 03, 2023. FINDINGS: Stable cardiomegaly. Both lungs are clear. The visualized skeletal structures are unremarkable. IMPRESSION: No active cardiopulmonary disease. Electronically Signed   By: Lupita Raider M.D.   On: 09/01/2023 15:55    Procedures Procedures    Medications Ordered in ED Medications  hydrALAZINE (APRESOLINE) tablet 50 mg (50 mg Oral Given 09/01/23 1941)  acetaminophen (TYLENOL) tablet 650 mg (650 mg Oral Given 09/01/23 1948)  oxyCODONE-acetaminophen (PERCOCET/ROXICET) 5-325 MG per tablet 1 tablet (1 tablet Oral Given 09/01/23 2149)    ED Course/ Medical Decision Making/ A&P                                 Medical Decision Making Patient presenting with anterior bilateral rib cage pain after tripping and falling in his yard prior to arrival.  He is in no respiratory distress but has reproducible pain with palpation and with deep inspiration.  He has no abdominal pain and no other complaints of extremity pain or head pain from this fall, he denies hitting his head.  Amount and/or Complexity of Data Reviewed Radiology: ordered.    Details: Plain film imaging was negative, CT chest was completed to rule out occult fracture or deeper injury, also negative.  No recent  respiratory infection and no symptoms suggesting this today.  Risk OTC drugs. Prescription drug management.           Final Clinical Impression(s) / ED Diagnoses Final diagnoses:  Chest wall pain    Rx / DC Orders ED Discharge Orders          Ordered    oxyCODONE-acetaminophen (PERCOCET/ROXICET) 5-325 MG tablet  Every 6 hours PRN        09/01/23 2257    oxyCODONE-acetaminophen (PERCOCET/ROXICET) 5-325 MG tablet  Every 6 hours PRN  09/01/23 2257              Burgess Amor, PA-C 09/01/23 2315    Gloris Manchester, MD 09/02/23 0030

## 2023-09-01 NOTE — Discharge Instructions (Signed)
 Recommend applying ice to your chest wall is much as is comfortable for the next 2 days.  Starting on Friday you can also add a heating pad for 20 minutes several times daily.  Your x-ray and CT scans are negative for any internal injuries including rib fractures or sternal fractures.  Suspect you have deep bruising causing your pain.  Follow-up with your primary provider if your symptoms are not improving over the next week.  You have been prescribed a small quantity of oxycodone, this medication will make you drowsy, do not drive within 4 hours of taking this medication.

## 2023-09-02 MED FILL — Oxycodone w/ Acetaminophen Tab 5-325 MG: ORAL | Qty: 6 | Status: AC

## 2023-09-03 ENCOUNTER — Other Ambulatory Visit (HOSPITAL_COMMUNITY): Payer: Self-pay

## 2023-09-03 ENCOUNTER — Other Ambulatory Visit: Payer: Self-pay

## 2023-09-03 ENCOUNTER — Ambulatory Visit (INDEPENDENT_AMBULATORY_CARE_PROVIDER_SITE_OTHER): Admitting: Infectious Diseases

## 2023-09-03 ENCOUNTER — Encounter: Payer: Self-pay | Admitting: Infectious Diseases

## 2023-09-03 VITALS — BP 217/93 | HR 76 | Resp 16 | Ht 67.0 in | Wt 223.0 lb

## 2023-09-03 DIAGNOSIS — Z992 Dependence on renal dialysis: Secondary | ICD-10-CM | POA: Diagnosis not present

## 2023-09-03 DIAGNOSIS — M009 Pyogenic arthritis, unspecified: Secondary | ICD-10-CM | POA: Insufficient documentation

## 2023-09-03 DIAGNOSIS — Z79899 Other long term (current) drug therapy: Secondary | ICD-10-CM | POA: Insufficient documentation

## 2023-09-03 DIAGNOSIS — I10 Essential (primary) hypertension: Secondary | ICD-10-CM | POA: Diagnosis not present

## 2023-09-03 DIAGNOSIS — N186 End stage renal disease: Secondary | ICD-10-CM | POA: Diagnosis not present

## 2023-09-03 NOTE — Progress Notes (Unsigned)
 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) 06/08/2023   ESRD on hemodialysis (HCC) 06/08/2023   Essential hypertension 06/08/2023   Infectious arthritis (HCC) 06/07/2023   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/2023   AKI (acute kidney injury) (HCC) 03/24/2023   Perinephric hematoma of transplanted kidney 03/24/2023   Acute blood loss anemia 03/24/2023   History of bleeding following renal biopsy 03/24/2023   Sinus drainage 03/24/2023   COVID-19 vaccination declined 03/24/2023   Class 2 obesity due to excess calories with body mass index (BMI) of 35.0 to 35.9 in adult 03/24/2023   Need for influenza vaccination 03/24/2023   Dermatitis 11/13/2022   Class 2 obesity due to excess calories with body mass index (BMI) of 37.0 to 37.9 in adult 11/13/2022   Rash 11/13/2022   Other fatigue 10/16/2022   Acquired absence of right leg below knee (HCC) 06/18/2022   Chronic kidney disease, stage 5 (HCC) 06/18/2022   Cutaneous abscess of right foot    Hyperkalemia 08/28/2021   Obesity (BMI 30-39.9) 08/28/2021   History of anemia due to chronic kidney disease 08/28/2021   Diabetic foot ulcer (HCC) 08/14/2021   Hypomagnesemia 06/25/2021   Peripheral vascular disease (HCC) 04/24/2020   Thrombophlebitis of superficial veins of right lower extremity    Difficult intravenous access    MRSA bacteremia    Diabetic foot infection (HCC)    Infected wound 01/12/2020   Critical lower limb ischemia (HCC) 02/02/2019   Personal history of noncompliance with medical treatment, presenting hazards to health 10/07/2018   Critical limb ischemia with history of revascularization of same extremity (HCC) 07/27/2018   Mixed hyperlipidemia 02/19/2018   History of renal transplant 02/19/2018   Conductive hearing loss, bilateral 08/31/2017   Nuclear sclerotic cataract of right eye 10/07/2016    Pseudophakia of left eye 10/07/2016   Hypertension due to endocrine disorder 03/04/2016   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 (HCC) 03/04/2016   Renal failure 03/04/2016   Proliferative diabetic retinopathy with macular edema associated with type 2 diabetes mellitus (HCC) 03/04/2016   Immunosuppression (HCC) 12/10/2015   Nausea vomiting and diarrhea 12/10/2015   Type 2 diabetes mellitus with stage 3b chronic kidney disease, without long-term current use of insulin (HCC) 12/10/2015   Benign hypertension with chronic kidney disease 12/10/2015   Pancytopenia (HCC) 12/10/2015   Encounter for screening colonoscopy 11/15/2014   GERD (gastroesophageal reflux disease) 11/15/2014    Patient's Medications  New Prescriptions   No medications on file  Previous Medications   ALBUTEROL (VENTOLIN HFA) 108 (90 BASE) MCG/ACT INHALER    Inhale 2 puffs into the lungs every 6 (six) hours as needed for wheezing or shortness of breath.   ATORVASTATIN (LIPITOR) 10 MG TABLET    Take 1 tablet by mouth once daily   BLOOD GLUCOSE METER KIT AND SUPPLIES KIT    Dispense based on patient and insurance preference. Use up to four times daily as directed. (FOR ICD-9 250.00, 250.01).   BLOOD GLUCOSE MONITORING SUPPL (ACCU-CHEK GUIDE ME) W/DEVICE KIT    1 Piece by Does not apply route as directed.   CLONIDINE (CATAPRES - DOSED IN MG/24 HR) 0.3 MG/24HR PATCH    Place 1 patch (0.3 mg total) onto the skin once a week.   CLOPIDOGREL (PLAVIX) 75 MG TABLET  Take 75 mg by mouth daily.   CLOTRIMAZOLE (MYCELEX) 10 MG TROCHE    Take 10 mg by mouth 5 (five) times daily.   CONTINUOUS BLOOD GLUC RECEIVER (DEXCOM G7 RECEIVER) DEVI    Use to check blood sugar 3 times a day. Dx code e11.65   CONTINUOUS GLUCOSE SENSOR (DEXCOM G7 SENSOR) MISC    USE TO CHECK GLUCOSE THREE TIMES DAILY AS DIRECTED, CHANGE SENSOR EVERY 10 DAYS.   DOXAZOSIN (CARDURA) 4 MG TABLET    Take  4 mg by mouth 2 (two) times daily.   DULAGLUTIDE (TRULICITY) 3 MG/0.5ML SOPN    Inject 3 mg as directed once a week.   EMPAGLIFLOZIN (JARDIANCE) 10 MG TABS TABLET    Take 1 tablet (10 mg total) by mouth daily before breakfast.   FUROSEMIDE (LASIX) 20 MG TABLET    Take 80 mg by mouth 2 (two) times daily as needed for fluid.   GLUCOSE BLOOD (ACCU-CHEK GUIDE) TEST STRIP    Use as instructed 4 x daily. E11.65   HYDRALAZINE (APRESOLINE) 25 MG TABLET    Take 2 tablets (50 mg total) by mouth 3 (three) times daily.   LATANOPROST (XALATAN) 0.005 % OPHTHALMIC SOLUTION    Place 1 drop into both eyes at bedtime.   MULTIPLE VITAMIN (MULTIVITAMIN) CAPSULE    Take 1 capsule by mouth daily.   MYCOPHENOLATE (MYFORTIC) 360 MG TBEC EC TABLET    Take 720 mg by mouth 2 (two) times daily.   NOVOLIN N FLEXPEN 100 UNIT/ML FLEXPEN    Inject 30 Units into the skin 2 (two) times daily before a meal. Takes 30 UNITS SUBCUTANEOUSLY IN THE MORNING with breakfast AND 30 UNITS with SUPPER.   OMEPRAZOLE (PRILOSEC) 40 MG CAPSULE    Take 40 mg by mouth every morning.   OXYCODONE-ACETAMINOPHEN (PERCOCET/ROXICET) 5-325 MG TABLET    Take 1 tablet by mouth every 6 (six) hours as needed for severe pain (pain score 7-10).   OXYCODONE-ACETAMINOPHEN (PERCOCET/ROXICET) 5-325 MG TABLET    Take 1 tablet by mouth every 6 (six) hours as needed for severe pain (pain score 7-10).   PREDNISONE (DELTASONE) 10 MG TABLET    Take 5 mg by mouth 2 (two) times daily with a meal.   SEVELAMER (RENAGEL) 800 MG TABLET    Take 800 mg by mouth 3 (three) times daily with meals. Swallow tablet whole do not crush ,break,or chew.   TACROLIMUS (PROGRAF) 0.5 MG CAPSULE    Take 3 capsules (1.5 mg total) by mouth 2 (two) times daily.   VITAMIN D, ERGOCALCIFEROL, (DRISDOL) 50000 UNITS CAPS CAPSULE    Take 50,000 Units by mouth See admin instructions. 15th of the month  Modified Medications   No medications on file  Discontinued Medications   No medications on file     Subjective: Discussed the use of AI scribe software for clinical note transcription with the patient, who gave verbal consent to proceed.   62 YO male with prior h/o ESRD on HD s/p kidney transplant in 02/2015, DM2, HTN, HLD, PVD, RT BKA, prior MRSA/E faecalis bacteremia in 2021 and Left elbow infection 05/2021 ( Group B strep) who is here for HFU after recent admission 1/12-1/17 for septic arthritis of rt knee. 1/12 SF with 81K WBC and Neutrophilic. Cx NG. Underwent rt knee arthrotomy on 1/13. OR findings with thick loculated purulence expressed. OR cx NG. Discharged on 1/17 to complete Vancomycin and cefepime/ceftazidime through 07/05/23.  4/10 Accompanied by wife. Completed abtx as instructed  with HD, no missed doses. No concerns in rt knee. Last seen by Ortho on 1/28 and no concerns in rt knee. Seen in the ED on 4/8 for rib cage pain after falling in yard with unremarkable work up and discharged on analgesics. No complaints today.   Review of Systems: all systems reviewed including MSK and negative except as above  Past Medical History:  Diagnosis Date   Diabetes (HCC)    Diabetes mellitus without complication (HCC)    type 2   ESRD (end stage renal disease) (HCC)    03/09/2015- patient had a kidney transplant   Hypertension    Nausea vomiting and diarrhea 12/10/2015   Peripheral vascular disease (HCC)    Renal disorder    Renal insufficiency    Past Surgical History:  Procedure Laterality Date   ABDOMINAL AORTOGRAM W/LOWER EXTREMITY N/A 09/11/2021   Procedure: ABDOMINAL AORTOGRAM W/LOWER EXTREMITY;  Surgeon: Iran Ouch, MD;  Location: [facility];  Service: Cardiovascular;  Laterality: N/A;   AMPUTATION Right 09/25/2021   Procedure: RIGHT TRANSMETATARSAL AMPUTATION AND APPLY TISSUE GRAFT;  Surgeon: Nadara Mustard, MD;  Location[facility];  Service: Orthopedics;  Laterality: Right;   AMPUTATION Right 11/16/2021   Procedure: RIGHT AMPUTATION BELOW KNEE WITH WOUND VAC  APPLICATION;  Surgeon: Nadara Mustard, MD;  Location: [facility] OR;  Service: Orthopedics;  Laterality: Right;   BONE BIOPSY Right 03/06/2021   Procedure: BONE BIOPSY;  Surgeon: Vivi Barrack, DPM;  Location: [facility] ORS;  Service: Podiatry;  Laterality: Right;   CENTRAL VENOUS CATHETER INSERTION Right 01/20/2020   Procedure: INSERTION CENTRAL LINE ADULT; Tunneled central line;  Surgeon: Lucretia Roers, MD;  Location: [facility];  Service: General;  Laterality: Right;   COLONOSCOPY N/A 12/05/2014   WUJ:[id number] external and internal hemorrhoid/mild diverticulosis/11 polyps removed   ESOPHAGOGASTRODUODENOSCOPY N/A 12/05/2014   GNF:AOZH duodenitis   GRAFT APPLICATION Right 03/06/2021   Procedure: GRAFT APPLICATION;  Surgeon: Vivi Barrack, DPM;  Location: [facility];  Service: Podiatry;  Laterality: Right;   INCISION AND DRAINAGE OF WOUND Right 08/15/2021   Procedure: IRRIGATION AND DEBRIDEMENT WOUND;  Surgeon: Asencion Islam, DPM;  Location: [facility];  Service: Podiatry;  Laterality: Right;  need to make card not  a irrigation and debridement card   IR FLUORO GUIDE CV LINE RIGHT  03/01/2019   IR PERC TUN PERIT CATH WO PORT S&I /IMAG  06/27/2021   IR REMOVAL TUN CV CATH W/O FL  05/10/2019   IR REMOVAL TUN CV CATH W/O FL  02/29/2020   IR REMOVAL TUN CV CATH W/O FL  07/19/2021   IR US GUIDE VASC ACCESS RIGHT  03/01/2019   IR US GUIDE VASC ACCESS RIGHT  06/27/2021   IRRIGATION AND DEBRIDEMENT ELBOW Left 06/24/2021   Procedure: IRRIGATION AND DEBRIDEMENT ELBOW;  Surgeon: Lyndle Herrlich, MD;  Location: [facility] ORS;  Service: Orthopedics;  Laterality: Left;   KIDNEY TRANSPLANT  03/27/2015   KNEE ARTHROTOMY Right 06/08/2023   Procedure: KNEE ARTHROTOMY;  Surgeon: Oliver Barre, MD;  Location: [facility];  Service: Orthopedics;  Laterality: Right;   Penile Pump Insertion     PERIPHERAL VASCULAR ATHERECTOMY  09/11/2021   Procedure: PERIPHERAL VASCULAR ATHERECTOMY;  Surgeon: Iran Ouch, MD;  Location: [facility];  Service: Cardiovascular;;  Shockwave Lithotripsy   TEE WITHOUT CARDIOVERSION N/A 01/17/2020   Procedure: TRANSESOPHAGEAL ECHOCARDIOGRAM (TEE) WITH PROPOFOL;  Surgeon: Antoine Poche, MD;  Location: [facility];  Service: Endoscopy;  Laterality: N/A;   WOUND DEBRIDEMENT Right 03/06/2021   Procedure: DEBRIDEMENT WOUND;  Surgeon: Vivi Barrack, DPM;  Location: [facility];  Service: Podiatry;  Laterality: Right;    Social History   Tobacco Use   Smoking status: Never   Smokeless tobacco: Never  Vaping Use   Vaping status: Never Used  Substance Use Topics   Alcohol use: No    Alcohol/week: 0.0 standard drinks of alcohol   Drug use: No    Family History  Problem Relation Age of Onset   Diabetes Mother    Heart disease Father    Colon cancer Neg Hx     Allergies  Allergen Reactions   Bactrim [Sulfamethoxazole-Trimethoprim]     Runs pt's sugar and phosphorus   Doxycycline Other (See Comments)    Runs up pt's blood sugar and phosphorus   Flagyl [Metronidazole] Rash    Patient having hives to either the vancomycin or flagyl (both infusing together).   Vancomycin Rash    Patient having hives to either vancomycin or flagyl    Health Maintenance  Topic Date Due   Medicare Annual Wellness (AWV)  Never done   COVID-19 Vaccine (3 - Pfizer risk series) 10/28/2019   OPHTHALMOLOGY EXAM  06/12/2021   FOOT EXAM  06/19/2023   HEMOGLOBIN A1C  07/27/2023   Pneumococcal Vaccine 72-50 Years old (2 of 2 - PCV) 06/17/2024 (Originally 03/10/2021)   INFLUENZA VACCINE  12/25/2023   Colonoscopy  12/04/2024   DTaP/Tdap/Td (2 - Td or Tdap) 03/15/2031   Hepatitis C Screening  Completed   HIV Screening  Completed   Zoster Vaccines- Shingrix  Completed   HPV VACCINES  Aged Out   Meningococcal B Vaccine  Aged Out    Objective: BP (!) 217/93   Pulse 76   Resp 16   Ht 5\' 7"  (1.702 m)   Wt 223 lb (101.2 kg) Comment: artificail right leg weighing 2.5 lb  BMI 34.93 kg/m     Physical Exam Constitutional:      Appearance: Normal appearance.  HENT:     Head: Normocephalic and atraumatic.      Mouth: Mucous membranes are moist.  Eyes:    Conjunctiva/sclera: Conjunctivae normal.     Pupils: Pupils are equal, round, and b/l symmetrical  Cardiovascular:     Rate and Rhythm: Normal rate and regular rhythm.     Heart sounds: s1s2  Pulmonary:     Effort: Pulmonary effort is normal.     Breath sounds: Normal breath sounds.   Abdominal:     General: Non distended     Palpations: soft.   Musculoskeletal:        General: Normal range of motion. Rt BKA with no swelling, warmth or tenderness, no signs of septic arthritis.   Skin:    General: Skin is warm and dry.     Comments: AVF with no concerns  Neurological:     General: grossly non focal     Mental Status: awake, alert and oriented to person, place, and time.   Psychiatric:        Mood and Affect: Mood normal.   Lab Results Lab Results  Component Value Date   WBC 8.0 06/10/2023   HGB 8.4 (L) 06/10/2023   HCT 30.1 (L) 06/10/2023   MCV 83.1 06/10/2023   PLT 323 06/10/2023    Lab Results  Component Value Date   CREATININE 4.22 (H) 06/10/2023   BUN 47 (H) 06/10/2023   NA 134 (L)  06/10/2023   K 4.3 06/10/2023   CL 98 06/10/2023   CO2 25 06/10/2023    Lab Results  Component Value Date   ALT 14 06/08/2023   AST 11 (L) 06/08/2023   ALKPHOS 74 06/08/2023   BILITOT 0.4 06/08/2023    Lab Results  Component Value Date   CHOL 140 03/12/2023   HDL 47 03/12/2023   LDLCALC 75 03/12/2023   TRIG 93 03/12/2023   CHOLHDL 3.0 03/12/2023   No results found for: "LABRPR", "RPRTITER" No results found for: "HIV1RNAQUANT", "HIV1RNAVL", "CD4TABS"   Assessment/Plan # RT knee septic arthritis  - s/p arthrotomy and completion of 4 weeks post op abtx as above - clinically improved  - Fu as needed   # Medication Management - He is planned to get labs with his PCP on 4/11   # ESRD - he  reports he has been off HD recently and making urine   # Elevated BP reading/HTN Did not take morning medications, no chest pain, headache, discussed to take anti HTN medications and fu with PCP   I have personally spent 62 minutes involved in face-to-face and non-face-to-face activities for this patient on the day of the visit. Professional time spent includes the following activities: Preparing to see the patient (review of tests), Obtaining and/or reviewing separately obtained history (admission/discharge record), Performing a medically appropriate examination and/or evaluation , Ordering medications/tests/procedures, referring and communicating with other health care professionals, Documenting clinical information in the EMR, Independently interpreting results (not separately reported), Communicating results to the patient/family/caregiver, Counseling and educating the patient/family/caregiver and Care coordination (not separately reported).   Of note, portions of this note may have been created with voice recognition software. While this note has been edited for accuracy, occasional wrong-word or 'sound-a-like' substitutions may have occurred due to the inherent limitations of voice recognition software.   Victoriano Lain, MD [facility] 09/03/2023, 10:13 AM

## 2023-09-04 ENCOUNTER — Inpatient Hospital Stay (HOSPITAL_BASED_OUTPATIENT_CLINIC_OR_DEPARTMENT_OTHER): Admitting: Oncology

## 2023-09-04 ENCOUNTER — Telehealth: Payer: Self-pay

## 2023-09-04 ENCOUNTER — Telehealth: Payer: Self-pay | Admitting: Oncology

## 2023-09-04 ENCOUNTER — Encounter: Payer: Self-pay | Admitting: Oncology

## 2023-09-04 ENCOUNTER — Inpatient Hospital Stay: Attending: Oncology | Admitting: Oncology

## 2023-09-04 VITALS — BP 149/61 | HR 72 | Temp 97.6°F | Resp 16 | Ht 67.0 in | Wt 223.5 lb

## 2023-09-04 DIAGNOSIS — E1122 Type 2 diabetes mellitus with diabetic chronic kidney disease: Secondary | ICD-10-CM | POA: Diagnosis not present

## 2023-09-04 DIAGNOSIS — N189 Chronic kidney disease, unspecified: Secondary | ICD-10-CM | POA: Diagnosis present

## 2023-09-04 DIAGNOSIS — Z940 Kidney transplant status: Secondary | ICD-10-CM | POA: Diagnosis not present

## 2023-09-04 DIAGNOSIS — R7989 Other specified abnormal findings of blood chemistry: Secondary | ICD-10-CM | POA: Insufficient documentation

## 2023-09-04 DIAGNOSIS — N185 Chronic kidney disease, stage 5: Secondary | ICD-10-CM

## 2023-09-04 DIAGNOSIS — D631 Anemia in chronic kidney disease: Secondary | ICD-10-CM | POA: Diagnosis not present

## 2023-09-04 DIAGNOSIS — D649 Anemia, unspecified: Secondary | ICD-10-CM

## 2023-09-04 DIAGNOSIS — E1165 Type 2 diabetes mellitus with hyperglycemia: Secondary | ICD-10-CM | POA: Diagnosis not present

## 2023-09-04 DIAGNOSIS — E162 Hypoglycemia, unspecified: Secondary | ICD-10-CM | POA: Insufficient documentation

## 2023-09-04 LAB — IRON AND TIBC
Iron: 33 ug/dL — ABNORMAL LOW (ref 45–182)
Saturation Ratios: 13 % — ABNORMAL LOW (ref 17.9–39.5)
TIBC: 251 ug/dL (ref 250–450)
UIBC: 218 ug/dL

## 2023-09-04 LAB — COMPREHENSIVE METABOLIC PANEL WITH GFR
ALT: 17 U/L (ref 0–44)
AST: 14 U/L — ABNORMAL LOW (ref 15–41)
Albumin: 3.5 g/dL (ref 3.5–5.0)
Alkaline Phosphatase: 130 U/L — ABNORMAL HIGH (ref 38–126)
Anion gap: 13 (ref 5–15)
BUN: 70 mg/dL — ABNORMAL HIGH (ref 8–23)
CO2: 20 mmol/L — ABNORMAL LOW (ref 22–32)
Calcium: 8.9 mg/dL (ref 8.9–10.3)
Chloride: 109 mmol/L (ref 98–111)
Creatinine, Ser: 5.03 mg/dL — ABNORMAL HIGH (ref 0.61–1.24)
GFR, Estimated: 12 mL/min — ABNORMAL LOW (ref >60)
Glucose, Bld: 35 mg/dL — CL (ref 70–99)
Potassium: 4.2 mmol/L (ref 3.5–5.1)
Sodium: 142 mmol/L (ref 135–145)
Total Bilirubin: 0.5 mg/dL (ref 0.0–1.2)
Total Protein: 8 g/dL (ref 6.5–8.1)

## 2023-09-04 LAB — CBC WITH DIFFERENTIAL/PLATELET
Abs Immature Granulocytes: 0.03 10*3/uL (ref 0.00–0.07)
Basophils Absolute: 0 10*3/uL (ref 0.0–0.1)
Basophils Relative: 0 %
Eosinophils Absolute: 0.1 10*3/uL (ref 0.0–0.5)
Eosinophils Relative: 1 %
HCT: 35.9 % — ABNORMAL LOW (ref 39.0–52.0)
Hemoglobin: 9.9 g/dL — ABNORMAL LOW (ref 13.0–17.0)
Immature Granulocytes: 1 %
Lymphocytes Relative: 34 %
Lymphs Abs: 1.9 10*3/uL (ref 0.7–4.0)
MCH: 22.4 pg — ABNORMAL LOW (ref 26.0–34.0)
MCHC: 27.6 g/dL — ABNORMAL LOW (ref 30.0–36.0)
MCV: 81.4 fL (ref 80.0–100.0)
Monocytes Absolute: 0.5 10*3/uL (ref 0.1–1.0)
Monocytes Relative: 8 %
Neutro Abs: 3.1 10*3/uL (ref 1.7–7.7)
Neutrophils Relative %: 56 %
Platelets: 179 10*3/uL (ref 150–400)
RBC: 4.41 MIL/uL (ref 4.22–5.81)
RDW: 19.3 % — ABNORMAL HIGH (ref 11.5–15.5)
WBC: 5.7 10*3/uL (ref 4.0–10.5)
nRBC: 0 % (ref 0.0–0.2)

## 2023-09-04 LAB — VITAMIN B12: Vitamin B-12: 532 pg/mL (ref 180–914)

## 2023-09-04 LAB — FERRITIN: Ferritin: 241 ng/mL (ref 24–336)

## 2023-09-04 LAB — FOLATE: Folate: 16.3 ng/mL (ref >5.9)

## 2023-09-04 NOTE — Progress Notes (Signed)
 CRITICAL VALUE ALERT Critical value received:  blood sugar 35 Date of notification:  09-04-23 Time of notification: 1242 Critical value read back:  Yes.   Nurse who received alert:  C.Barbara Ahart RN MD notified time and response:  Dr. Anders Simmonds No new orders. Patient was her for clinic office visit and was eating a "bar", patient has his own blood sugar machine on body and is aware of blood sugars.

## 2023-09-04 NOTE — Assessment & Plan Note (Signed)
 Anemia is likely multifactorial at this time. Patient has chronic kidney disease and was on dialysis previously and is off of it since the past 2 months.  Denies erythropoietin shot use. Previous labs showed a ferritin of 900 with a TSAT of 7.  Patient was getting IV iron with his dialysis previously. He has a GI appointment scheduled later this month as he is due for endoscopy and colonoscopy  - Will repeat labs today including CBC with differential, iron panel, ferritin, folate and vitamin B12, soluble transferrin receptor - Will also obtain multiple myeloma labs - If the patient is found to be iron deficient, will administer IV iron.  Discussed risk versus benefits of IV iron and common adverse effects including allergic reactions, headaches and nausea  Return to clinic in 1 month to assess for response to IV iron.

## 2023-09-04 NOTE — Assessment & Plan Note (Signed)
 Patient reported that his app showed that he is hypoglycemic Labs done today also showed hyperglycemia  -Patient was given some juice and granola bars.  Was advised to have a tight glycemic control

## 2023-09-04 NOTE — Telephone Encounter (Signed)
 Patient is coming to see me on April 14, we will renew Dexcom at that time.

## 2023-09-04 NOTE — Progress Notes (Signed)
 [name] at [name]  HEMATOLOGY NEW VISIT  [name], FNP  REASON FOR REFERRAL: Anemia of chronic kidney disease  HISTORY OF PRESENT ILLNESS: Raymond [name] 63 y.o. male referred for anemia of chronic kidney disease.  Patient has a past medical history of chronic kidney disease secondary to diabetes and hypertensive nephrosclerosis.  He began dialysis in 2012 and has been off of it for the past 2 months.  Patient also had a donor kidney transplant in October 2016.  Patient was recently admitted to the hospital in March for right knee infection and was on prolonged antibiotics.  Today, he reports feeling well and has no complaints.  He has some fatigue but is at baseline.  He reported that his labs show that he is hyperglycemic.  He has no other complaints today.  He is scheduled to get colonoscopy done and will be seeing GI on the 24th of this month.  He denies smoking, alcohol use.  I have reviewed the past medical history, past surgical history, social history and family history with the patient   ALLERGIES:  is allergic to bactrim [sulfamethoxazole-trimethoprim], doxycycline, flagyl [metronidazole], and vancomycin.  MEDICATIONS:  Current Outpatient Medications  Medication Sig Dispense Refill   albuterol (VENTOLIN HFA) 108 (90 Base) MCG/ACT inhaler Inhale 2 puffs into the lungs every 6 (six) hours as needed for wheezing or shortness of breath.     atorvastatin (LIPITOR) 10 MG tablet Take 1 tablet by mouth once daily 30 tablet 0   blood glucose meter kit and supplies KIT Dispense based on patient and insurance preference. Use up to four times daily as directed. (FOR ICD-9 250.00, 250.01). 1 each 0   Blood Glucose Monitoring Suppl (ACCU-CHEK GUIDE ME) w/Device KIT 1 Piece by Does not apply route as directed. 1 kit 0   cloNIDine (CATAPRES - DOSED IN MG/24 HR) 0.3 mg/24hr patch Place 1 patch (0.3 mg total) onto the skin once a week. (Patient taking  differently: Place 0.3 mg onto the skin once a week. Place patch on Sunday) 4 patch 12   clopidogrel (PLAVIX) 75 MG tablet Take 75 mg by mouth daily.     clotrimazole (MYCELEX) 10 MG troche Take 10 mg by mouth 5 (five) times daily.     Continuous Blood Gluc Receiver (DEXCOM G7 RECEIVER) DEVI Use to check blood sugar 3 times a day. Dx code e11.65 1 each 1   Continuous Glucose Sensor (DEXCOM G7 SENSOR) MISC USE TO CHECK GLUCOSE THREE TIMES DAILY AS DIRECTED, CHANGE SENSOR EVERY 10 DAYS. 3 each 5   doxazosin (CARDURA) 4 MG tablet Take 4 mg by mouth 2 (two) times daily.     Dulaglutide (TRULICITY) 3 MG/0.5ML SOPN Inject 3 mg as directed once a week. (Patient taking differently: Inject 3 mg as directed once a week. Takes on Sunday) 6 mL 4   empagliflozin (JARDIANCE) 10 MG TABS tablet Take 1 tablet (10 mg total) by mouth daily before breakfast. 30 tablet 0   furosemide (LASIX) 20 MG tablet Take 80 mg by mouth 2 (two) times daily as needed for fluid.     glucose blood (ACCU-CHEK GUIDE) test strip Use as instructed 4 x daily. E11.65 150 each 5   hydrALAZINE (APRESOLINE) 25 MG tablet Take 2 tablets (50 mg total) by mouth 3 (three) times daily.     latanoprost (XALATAN) 0.005 % ophthalmic solution Place 1 drop into both eyes at bedtime.     Multiple Vitamin (MULTIVITAMIN) capsule Take  1 capsule by mouth daily.     mycophenolate (MYFORTIC) 360 MG TBEC EC tablet Take 720 mg by mouth 2 (two) times daily.     NOVOLIN N FLEXPEN 100 UNIT/ML FlexPen Inject 30 Units into the skin 2 (two) times daily before a meal. Takes 30 UNITS SUBCUTANEOUSLY IN THE MORNING with breakfast AND 30 UNITS with SUPPER.     omeprazole (PRILOSEC) 40 MG capsule Take 40 mg by mouth every morning.     oxyCODONE-acetaminophen (PERCOCET/ROXICET) 5-325 MG tablet Take 1 tablet by mouth every 6 (six) hours as needed for severe pain (pain score 7-10). 10 tablet 0   oxyCODONE-acetaminophen (PERCOCET/ROXICET) 5-325 MG tablet Take 1 tablet by mouth  every 6 (six) hours as needed for severe pain (pain score 7-10). 6 tablet 0   predniSONE (DELTASONE) 10 MG tablet Take 5 mg by mouth 2 (two) times daily with a meal.     sevelamer (RENAGEL) 800 MG tablet Take 800 mg by mouth 3 (three) times daily with meals. Swallow tablet whole do not crush ,break,or chew.     tacrolimus (PROGRAF) 0.5 MG capsule Take 3 capsules (1.5 mg total) by mouth 2 (two) times daily.     Vitamin D, Ergocalciferol, (DRISDOL) 50000 units CAPS capsule Take 50,000 Units by mouth See admin instructions. 15th of the month     No current facility-administered medications for this visit.     REVIEW OF SYSTEMS:   Constitutional: Denies fevers, chills or night sweats Eyes: Denies blurriness of vision Ears, nose, mouth, throat, and face: Denies mucositis or sore throat Respiratory: Denies cough, dyspnea or wheezes Cardiovascular: Denies palpitation, chest discomfort or lower extremity swelling Gastrointestinal:  Denies nausea, heartburn or change in bowel habits Skin: Denies abnormal skin rashes Lymphatics: Denies new lymphadenopathy or easy bruising Neurological:Denies numbness, tingling or new weaknesses Behavioral/Psych: Mood is stable, no new changes  All other systems were reviewed with the patient and are negative.  PHYSICAL EXAMINATION:   Vitals:   09/04/23 1054  BP: (!) 149/61  Pulse: 72  Resp: 16  Temp: 97.6 F (36.4 C)  SpO2: 100%    GENERAL:alert, no distress and comfortable LUNGS: clear to auscultation and percussion with normal breathing effort HEART: regular rate & rhythm and no murmurs and no lower extremity edema ABDOMEN:abdomen soft, non-tender and normal bowel sounds Musculoskeletal:no cyanosis of digits and no clubbing  NEURO: alert & oriented x 3 with fluent speech  LABORATORY DATA:  I have reviewed the data as listed in records from nephrologist labs obtained on 08/21/2023 CMP: Creatinine: 4.6, calcium: 8.9, albumin: 3.1 CBC: WBC: 6.5,  hemoglobin: 9.5, hematocrit: 32.2, platelets: 233, MCV: 77 Iron saturation: 7, ferritin: 944  Lab Results  Component Value Date   WBC 5.7 09/04/2023   NEUTROABS 3.1 09/04/2023   HGB 9.9 (L) 09/04/2023   HCT 35.9 (L) 09/04/2023   MCV 81.4 09/04/2023   PLT 179 09/04/2023      Chemistry      Component Value Date/Time   NA 142 09/04/2023 1143   NA 142 02/05/2022 1023   NA 142 03/13/2014 1049   K 4.2 09/04/2023 1143   K 4.1 03/13/2014 1049   CL 109 09/04/2023 1143   CL 101 03/13/2014 1049   CO2 20 (L) 09/04/2023 1143   CO2 30 03/13/2014 1049   BUN 70 (H) 09/04/2023 1143   BUN 30 (H) 02/05/2022 1023   BUN 45 (H) 03/13/2014 1049   CREATININE 5.03 (H) 09/04/2023 1143   CREATININE 1.30 03/26/2021  1513   GLU 362 11/29/2021 0000      Component Value Date/Time   CALCIUM 8.9 09/04/2023 1143   CALCIUM 8.2 (L) 03/13/2014 1049   ALKPHOS 130 (H) 09/04/2023 1143   AST 14 (L) 09/04/2023 1143   ALT 17 09/04/2023 1143   BILITOT 0.5 09/04/2023 1143   BILITOT 0.4 02/28/2021 1006      ASSESSMENT & PLAN:  Patient is a 63 y.o. male referred for anemia of chronic kidney disease  Anemia Anemia is likely multifactorial at this time. Patient has chronic kidney disease and was on dialysis previously and is off of it since the past 2 months.  Denies erythropoietin shot use. Previous labs showed a ferritin of 900 with a TSAT of 7.  Patient was getting IV iron with his dialysis previously. He has a GI appointment scheduled later this month as he is due for endoscopy and colonoscopy  - Will repeat labs today including CBC with differential, iron panel, ferritin, folate and vitamin B12, soluble transferrin receptor - Will also obtain multiple myeloma labs - If the patient is found to be iron deficient, will administer IV iron.  Discussed risk versus benefits of IV iron and common adverse effects including allergic reactions, headaches and nausea  Return to clinic in 1 month to assess for response  to IV iron.  Elevated ferritin Likely secondary to acute infection at that time  - Will repeat labs today  CKD (chronic kidney disease) Patient has chronic kidney disease secondary to diabetes and hypertension S/p kidney transplant Currently not on dialysis  - Continue to follow with nephrology -If iron deficiency is fixed, patient still anemic, will consider erythropoietin shots  Hypoglycemia Patient reported that his app showed that he is hypoglycemic Labs done today also showed hyperglycemia  -Patient was given some juice and granola bars.  Was advised to have a tight glycemic control   Orders Placed This Encounter  Procedures   Ferritin    Standing Status:   Future    Number of Occurrences:   1    Expected Date:   09/04/2023    Expiration Date:   09/03/2024   Folate    Standing Status:   Future    Number of Occurrences:   1    Expected Date:   09/04/2023    Expiration Date:   09/03/2024   Vitamin B12    Standing Status:   Future    Number of Occurrences:   1    Expected Date:   09/04/2023    Expiration Date:   09/03/2024   Comprehensive metabolic panel with GFR    Standing Status:   Future    Number of Occurrences:   1    Expected Date:   09/04/2023    Expiration Date:   09/03/2024   CBC with Differential/Platelet    Standing Status:   Future    Number of Occurrences:   1    Expected Date:   09/04/2023    Expiration Date:   09/03/2024   Kappa/lambda light chains    Standing Status:   Future    Number of Occurrences:   1    Expected Date:   09/04/2023    Expiration Date:   09/03/2024   Multiple Myeloma Panel (SPEP&IFE w/QIG)    Standing Status:   Future    Number of Occurrences:   1    Expected Date:   09/04/2023    Expiration Date:   09/03/2024   Iron and  TIBC    Standing Status:   Future    Number of Occurrences:   1    Expected Date:   09/04/2023    Expiration Date:   09/03/2024   Soluble transferrin receptor    Standing Status:   Future    Number of  Occurrences:   1    Expected Date:   09/04/2023    Expiration Date:   09/03/2024    The total time spent in the appointment was 40 minutes encounter with patients including review of chart and various tests results, discussions about plan of care and coordination of care plan   All questions were answered. The patient knows to call the clinic with any problems, questions or concerns. No barriers to learning was detected.   Cindie Crumbly, MD 4/11/20253:18 PM

## 2023-09-04 NOTE — Transitions of Care (Post Inpatient/ED Visit) (Signed)
   09/04/2023  Name: Dorthy Hustead MRN: [id number] DOB: 1960-12-08  Today's TOC FU Call Status: Today's TOC FU Call Status:: Successful TOC FU Call Completed TOC FU Call Complete Date: 09/04/23 Patient's Name and Date of Birth confirmed.  Transition Care Management Follow-up Telephone Call Date of Discharge: 09/01/23 [facility] Type of Discharge: Inpatient Admission Primary Inpatient Discharge Diagnosis:: fall How have you been since you were released from the hospital?: Better Any questions or concerns?: No  Items Reviewed: Did you receive and understand the discharge instructions provided?: Yes Medications obtained,verified, and reconciled?: Yes (Medications Reviewed) Any new allergies since your discharge?: No Dietary orders reviewed?: No Do you have support at home?: Yes People in Home [RPT]: spouse  Medications Reviewed Today: Medications Reviewed Today   Medications were not reviewed in this encounter     Home Care and Equipment/Supplies: Were Home Health Services Ordered?: No Any new equipment or medical supplies ordered?: No  Functional Questionnaire: Do you need assistance with bathing/showering or dressing?: No Do you need assistance with meal preparation?: No Do you need assistance with eating?: No Do you have difficulty maintaining continence: No Do you need assistance with getting out of bed/getting out of a chair/moving?: No Do you have difficulty managing or taking your medications?: No  Follow up appointments reviewed: PCP Follow-up appointment confirmed?: Yes Date of PCP follow-up appointment?: 09/07/23 Follow-up Provider: [facility] Follow-up appointment confirmed?: No Do you need transportation to your follow-up appointment?: No Do you understand care options if your condition(s) worsen?: Yes-patient verbalized understanding    SIGNATURE Lisabeth Devoid, CMA

## 2023-09-04 NOTE — Assessment & Plan Note (Signed)
 Likely secondary to acute infection at that time  - Will repeat labs today

## 2023-09-04 NOTE — Assessment & Plan Note (Signed)
 Patient has chronic kidney disease secondary to diabetes and hypertension S/p kidney transplant Currently not on dialysis  - Continue to follow with nephrology -If iron deficiency is fixed, patient still anemic, will consider erythropoietin shots

## 2023-09-04 NOTE — Progress Notes (Signed)
 I called the patient to discuss his low blood sugar levels.  He stated that he had something to eat and drink before he left the hospital and currently his labs shows a blood sugar of 95 and he is doing well with no complaints.

## 2023-09-04 NOTE — Telephone Encounter (Signed)
 Pharmacy Patient Advocate Encounter   Received notification from CoverMyMeds that prior authorization for Dexcom G7 sensor is required/requested.   Insurance verification completed.   The patient is insured through [facility] .   Per test claim: PA required; However, NEW/RECENT labs/notes are needed to complete & submit PA request. Please see below.  Pt must have had a face-to-face visit within the last 3 months, per PA form

## 2023-09-05 ENCOUNTER — Encounter: Payer: Self-pay | Admitting: Oncology

## 2023-09-06 LAB — SOLUBLE TRANSFERRIN RECEPTOR: Transferrin Receptor: 40.3 nmol/L — ABNORMAL HIGH (ref 12.2–27.3)

## 2023-09-07 ENCOUNTER — Encounter: Payer: Self-pay | Admitting: Oncology

## 2023-09-07 ENCOUNTER — Encounter: Payer: Self-pay | Admitting: Endocrinology

## 2023-09-07 ENCOUNTER — Ambulatory Visit (INDEPENDENT_AMBULATORY_CARE_PROVIDER_SITE_OTHER): Admitting: Endocrinology

## 2023-09-07 ENCOUNTER — Ambulatory Visit (INDEPENDENT_AMBULATORY_CARE_PROVIDER_SITE_OTHER): Payer: Self-pay | Admitting: Nurse Practitioner

## 2023-09-07 ENCOUNTER — Encounter: Payer: Self-pay | Admitting: Nurse Practitioner

## 2023-09-07 VITALS — BP 132/80 | HR 75 | Temp 97.8°F | Ht 67.0 in | Wt 224.8 lb

## 2023-09-07 DIAGNOSIS — W19XXXD Unspecified fall, subsequent encounter: Secondary | ICD-10-CM

## 2023-09-07 DIAGNOSIS — E1122 Type 2 diabetes mellitus with diabetic chronic kidney disease: Secondary | ICD-10-CM | POA: Insufficient documentation

## 2023-09-07 DIAGNOSIS — N1832 Chronic kidney disease, stage 3b: Secondary | ICD-10-CM | POA: Diagnosis not present

## 2023-09-07 DIAGNOSIS — E1165 Type 2 diabetes mellitus with hyperglycemia: Secondary | ICD-10-CM

## 2023-09-07 DIAGNOSIS — E782 Mixed hyperlipidemia: Secondary | ICD-10-CM

## 2023-09-07 DIAGNOSIS — W19XXXA Unspecified fall, initial encounter: Secondary | ICD-10-CM | POA: Insufficient documentation

## 2023-09-07 DIAGNOSIS — E6609 Other obesity due to excess calories: Secondary | ICD-10-CM

## 2023-09-07 DIAGNOSIS — I129 Hypertensive chronic kidney disease with stage 1 through stage 4 chronic kidney disease, or unspecified chronic kidney disease: Secondary | ICD-10-CM

## 2023-09-07 DIAGNOSIS — Z794 Long term (current) use of insulin: Secondary | ICD-10-CM

## 2023-09-07 DIAGNOSIS — E1169 Type 2 diabetes mellitus with other specified complication: Secondary | ICD-10-CM | POA: Diagnosis not present

## 2023-09-07 DIAGNOSIS — E66812 Obesity, class 2: Secondary | ICD-10-CM

## 2023-09-07 DIAGNOSIS — Z6835 Body mass index (BMI) 35.0-35.9, adult: Secondary | ICD-10-CM

## 2023-09-07 LAB — MULTIPLE MYELOMA PANEL, SERUM
Albumin SerPl Elph-Mcnc: 3.5 g/dL (ref 2.9–4.4)
Albumin/Glob SerPl: 0.9 (ref 0.7–1.7)
Alpha 1: 0.3 g/dL (ref 0.0–0.4)
Alpha2 Glob SerPl Elph-Mcnc: 0.9 g/dL (ref 0.4–1.0)
B-Globulin SerPl Elph-Mcnc: 1.1 g/dL (ref 0.7–1.3)
Gamma Glob SerPl Elph-Mcnc: 1.6 g/dL (ref 0.4–1.8)
Globulin, Total: 3.9 g/dL (ref 2.2–3.9)
IgA: 251 mg/dL (ref 61–437)
IgG (Immunoglobin G), Serum: 1767 mg/dL — ABNORMAL HIGH (ref 603–1613)
IgM (Immunoglobulin M), Srm: 32 mg/dL (ref 20–172)
Total Protein ELP: 7.4 g/dL (ref 6.0–8.5)

## 2023-09-07 LAB — KAPPA/LAMBDA LIGHT CHAINS
Kappa free light chain: 138.2 mg/L — ABNORMAL HIGH (ref 3.3–19.4)
Kappa, lambda light chain ratio: 1.92 — ABNORMAL HIGH (ref 0.26–1.65)
Lambda free light chains: 72.1 mg/L — ABNORMAL HIGH (ref 5.7–26.3)

## 2023-09-07 MED ORDER — DEXCOM G7 SENSOR MISC: 3 refills | Status: DC

## 2023-09-07 MED ORDER — NOVOLOG FLEXPEN 100 UNIT/ML ~~LOC~~ SOPN: PEN_INJECTOR | SUBCUTANEOUS | 4 refills | Status: DC

## 2023-09-07 MED ORDER — TRULICITY 3 MG/0.5ML ~~LOC~~ SOAJ: 3.0000 mg | SUBCUTANEOUS | 4 refills | Status: DC

## 2023-09-07 MED ORDER — NOVOLIN N FLEXPEN 100 UNIT/ML ~~LOC~~ SUPN: 30.0000 [IU] | PEN_INJECTOR | Freq: Two times a day (BID) | SUBCUTANEOUS | Status: DC

## 2023-09-07 NOTE — Progress Notes (Signed)
 [place], CMA,acting as a Neurosurgeon for Raymond Epley, FNP.,have documented all relevant documentation on the behalf of Raymond Epley, FNP,as directed by  Raymond Epley, FNP while in the presence of Raymond Epley, FNP.  Subjective:  Patient ID: Raymond [name] , male    DOB: 1961-05-13 , 63 y.o.   MRN: [id number]  No chief complaint on file.   HPI  Patient presents today for a hospital follow up, patient reports compliance with medications. Patient denies any chest pain, SOB, or headaches. Patient was admitted and discharged on 09/01/2023 for a fall and chest pain. Patient reports today they are feeling better, but his ribs are still pretty sore. He reports he was told he had a bruised rib.      Past Medical History:  Diagnosis Date   Diabetes (HCC)    Diabetes mellitus without complication (HCC)    type 2   ESRD (end stage renal disease) (HCC)    03/09/2015- patient had a kidney transplant   Hypertension    Nausea vomiting and diarrhea 12/10/2015   Peripheral vascular disease (HCC)    Renal disorder    Renal insufficiency      Family History  Problem Relation Age of Onset   Diabetes Mother    Heart disease Father    Colon cancer Neg Hx      Current Outpatient Medications:    albuterol  (VENTOLIN  HFA) 108 (90 Base) MCG/ACT inhaler, Inhale 2 puffs into the lungs every 6 (six) hours as needed for wheezing or shortness of breath., Disp: , Rfl:    atorvastatin  (LIPITOR) 10 MG tablet, Take 1 tablet by mouth once daily, Disp: 30 tablet, Rfl: 0   blood glucose meter kit and supplies KIT, Dispense based on patient and insurance preference. Use up to four times daily as directed. (FOR ICD-9 250.00, 250.01)., Disp: 1 each, Rfl: 0   Blood Glucose Monitoring Suppl (ACCU-CHEK GUIDE ME) w/Device KIT, 1 Piece by Does not apply route as directed., Disp: 1 kit, Rfl: 0   cloNIDine  (CATAPRES  - DOSED IN MG/24 HR) 0.3 mg/24hr patch, Place 1 patch (0.3 mg total) onto the skin once a week.  (Patient taking differently: Place 0.3 mg onto the skin once a week. Place patch on Sunday), Disp: 4 patch, Rfl: 12   clopidogrel  (PLAVIX ) 75 MG tablet, Take 75 mg by mouth daily., Disp: , Rfl:    clotrimazole (MYCELEX) 10 MG troche, Take 10 mg by mouth 5 (five) times daily., Disp: , Rfl:    Continuous Blood Gluc Receiver (DEXCOM G7 RECEIVER) DEVI, Use to check blood sugar 3 times a day. Dx code e11.65, Disp: 1 each, Rfl: 1   doxazosin  (CARDURA ) 4 MG tablet, Take 4 mg by mouth 2 (two) times daily., Disp: , Rfl:    furosemide  (LASIX ) 20 MG tablet, Take 80 mg by mouth 2 (two) times daily as needed for fluid., Disp: , Rfl:    glucose blood (ACCU-CHEK GUIDE) test strip, Use as instructed 4 x daily. E11.65, Disp: 150 each, Rfl: 5   hydrALAZINE  (APRESOLINE ) 25 MG tablet, Take 2 tablets (50 mg total) by mouth 3 (three) times daily., Disp: , Rfl:    latanoprost  (XALATAN ) 0.005 % ophthalmic solution, Place 1 drop into both eyes at bedtime., Disp: , Rfl:    Multiple Vitamin (MULTIVITAMIN) capsule, Take 1 capsule by mouth daily., Disp: , Rfl:    mycophenolate  (MYFORTIC ) 360 MG TBEC EC tablet, Take 720 mg by mouth 2 (two) times daily., Disp: , Rfl:  omeprazole  (PRILOSEC) 40 MG capsule, Take 40 mg by mouth every morning., Disp: , Rfl:    oxyCODONE -acetaminophen  (PERCOCET/ROXICET) 5-325 MG tablet, Take 1 tablet by mouth every 6 (six) hours as needed for severe pain (pain score 7-10)., Disp: 10 tablet, Rfl: 0   oxyCODONE -acetaminophen  (PERCOCET/ROXICET) 5-325 MG tablet, Take 1 tablet by mouth every 6 (six) hours as needed for severe pain (pain score 7-10)., Disp: 6 tablet, Rfl: 0   predniSONE  (DELTASONE ) 10 MG tablet, Take 5 mg by mouth 2 (two) times daily with a meal., Disp: , Rfl:    sevelamer (RENAGEL) 800 MG tablet, Take 800 mg by mouth 3 (three) times daily with meals. Swallow tablet whole do not crush ,break,or chew., Disp: , Rfl:    tacrolimus  (PROGRAF ) 0.5 MG capsule, Take 3 capsules (1.5 mg total) by  mouth 2 (two) times daily., Disp: , Rfl:    Vitamin D , Ergocalciferol , (DRISDOL ) 50000 units CAPS capsule, Take 50,000 Units by mouth See admin instructions. 15th of the month, Disp: , Rfl:    Continuous Glucose Sensor (DEXCOM G7 SENSOR) MISC, USE TO CHECK GLUCOSE THREE TIMES DAILY AS DIRECTED, CHANGE SENSOR EVERY 10 DAYS., Disp: 9 each, Rfl: 3   Dulaglutide  (TRULICITY ) 3 MG/0.5ML SOAJ, Inject 3 mg as directed once a week., Disp: 6 mL, Rfl: 4   insulin  aspart (NOVOLOG  FLEXPEN) 100 UNIT/ML FlexPen, Take 0-10 units with meals 3 times a day, maximum 30  units /day., Disp: 15 mL, Rfl: 4   NOVOLIN  N FLEXPEN 100 UNIT/ML FlexPen, Inject 30 Units into the skin 2 (two) times daily before a meal. Takes 30 UNITS SUBCUTANEOUSLY IN THE MORNING with breakfast AND 30 UNITS with SUPPER., Disp: , Rfl:    Allergies  Allergen Reactions   Bactrim  [Sulfamethoxazole -Trimethoprim ]     Runs pt's sugar and phosphorus   Doxycycline  Other (See Comments)    Runs up pt's blood sugar and phosphorus   Flagyl  [Metronidazole ] Rash    Patient having hives to either the vancomycin  or flagyl  (both infusing together).   Vancomycin  Rash    Patient having hives to either vancomycin  or flagyl      Review of Systems  Constitutional: Negative.   Eyes: Negative.   Respiratory: Negative.    Cardiovascular: Negative.   Gastrointestinal: Negative.   Musculoskeletal: Negative.        Right above the knee AKA  Skin: Negative.   Neurological: Negative.   Psychiatric/Behavioral: Negative.       Today's Vitals   09/07/23 1031  BP: 132/80  Pulse: 75  Temp: 97.8 F (36.6 C)  TempSrc: Oral  Weight: 224 lb 12.8 oz (102 kg)  Height: 5\' 7"  (1.702 m)  PainSc: 3   PainLoc: Rib Cage   Body mass index is 35.21 kg/m.  Wt Readings from Last 3 Encounters:  09/07/23 223 lb (101.2 kg)  09/07/23 224 lb 12.8 oz (102 kg)  09/04/23 223 lb 8 oz (101.4 kg)     Objective:  Physical Exam Vitals and nursing note reviewed.   Constitutional:      General: He is not in acute distress.    Appearance: Normal appearance. He is obese.  Cardiovascular:     Rate and Rhythm: Normal rate and regular rhythm.     Pulses: Normal pulses.     Heart sounds: Normal heart sounds. No murmur heard. Pulmonary:     Effort: Pulmonary effort is normal. No respiratory distress.     Breath sounds: Normal breath sounds. No wheezing.  Musculoskeletal:  General: Tenderness (left rib cage area) present.     Comments: Right below the knee amputation  Skin:    General: Skin is warm.     Capillary Refill: Capillary refill takes less than 2 seconds.  Neurological:     General: No focal deficit present.     Mental Status: He is alert and oriented to person, place, and time.     Cranial Nerves: No cranial nerve deficit.     Motor: No weakness.  Psychiatric:        Mood and Affect: Mood normal.        Behavior: Behavior normal.        Thought Content: Thought content normal.        Judgment: Judgment normal.         Assessment And Plan:  Type 2 diabetes mellitus with stage 3b chronic kidney disease, with long-term current use of insulin  (HCC) Assessment & Plan: Chronic, will check A1c. Continue current medications.   Orders: -     Hemoglobin A1c -     BMP8+eGFR -     Ambulatory referral to Ophthalmology  Benign hypertension with chronic kidney disease Assessment & Plan: Blood pressure is well controlled, continue current medications.   Orders: -     BMP8+eGFR  Mixed hyperlipidemia Assessment & Plan: Cholesterol levels are stable. Continue current medications.   Orders: -     Lipid panel  Fall, subsequent encounter Assessment & Plan: Injured his ribs with his fall on a carpet rug, advised to remove any throw rugs. He is feeling better and was seen at the ER. Mild tenderness to ribs on left side.    Class 2 obesity due to excess calories with body mass index (BMI) of 35.0 to 35.9 in adult, unspecified whether  serious comorbidity present Assessment & Plan: he is encouraged to strive for BMI less than 30 to decrease cardiac risk. Advised to aim for at least 150 minutes of exercise per week.      Return in about 4 months (around 01/07/2024) for needs welcome medicare..  Patient was given opportunity to ask questions. Patient verbalized understanding of the plan and was able to repeat key elements of the plan. All questions were answered to their satisfaction.    Inge Mangle, FNP, have reviewed all documentation for this visit. The documentation on 09/07/23 for the exam, diagnosis, procedures, and orders are all accurate and complete.   IF YOU HAVE BEEN REFERRED TO A SPECIALIST, IT MAY TAKE 1-2 WEEKS TO SCHEDULE/PROCESS THE REFERRAL. IF YOU HAVE NOT HEARD FROM US /SPECIALIST IN TWO WEEKS, PLEASE GIVE US  A CALL AT [phone].

## 2023-09-07 NOTE — Progress Notes (Signed)
 Outpatient Endocrinology Note Raymond Andrei Mccook, MD  09/07/23  Patient's Name: Raymond [name]    DOB: 1960/08/23    MRN: [id number]                                                    REASON OF VISIT: Follow up for type 2 diabetes mellitus  PCP: [name], FNP  HISTORY OF PRESENT ILLNESS:   Raymond [name] is a 63 y.o. old male with past medical history listed below, is here for follow up of type 2 diabetes mellitus.   Pertinent Diabetes History: Patient was diagnosed with type 2 diabetes mellitus in 2002.  Insulin therapy was started in 2018.  He has uncontrolled diabetes mellitus hemoglobin A1c in the range of 7.1 to around 14%.  In the visit in January 27, 2023 patient was taking Novolin and 30 units 2 times a day, insulin regimen was adjusted with the start of NovoLog with meals.  Chronic Diabetes Complications : Retinopathy: proliferative diabetic retinopathy with macular edema. Last ophthalmology exam was done on Due. Nephropathy: microalbuminuria present, following with nephrology. Peripheral neuropathy: yes, on gabapentin.  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 Coronary artery disease: no Stroke: no  Relevant comorbidities and cardiovascular risk factors: Obesity: yes Body mass index is 34.93 kg/m.  Hypertension: yes Hyperlipidemia. Yes, on statin  Current / Home Diabetic regimen includes: Novolin N 30 units in the morning with breakfast and 30 units at bedtime.   NovoLog 10 units usually with lunch once a day.  Not fully compliant.  Trulicity 3.0 mg weekly.  Prior diabetic medications: Glipizide in the past.  Glycemic data:    CONTINUOUS GLUCOSE MONITORING SYSTEM (CGMS) INTERPRETATION:                         Dexcom G7 CGM-  Sensor Download (Sensor download was reviewed and summarized below.) Dates: April 1 - September 07, 2023  Glucose Management Indicator: 7.9 % Sensor Average: 192 SD 67    Impression: Frequent hyperglycemia with blood  sugar of 250-300s range postprandially mostly with supper, sometimes with lunch.  Some of the days acceptable blood sugar.  One episode of hypoglycemia in blood sugar 60 overnight likely related to use of NovoLog.  No concerning hypoglycemia.  Hypoglycemia: Patient has minor hypoglycemic episodes. Patient has hypoglycemia awareness/  Factors modifying glucose control: 1.  Diabetic diet assessment: 3 meals a day and he reports frequent snacking.  Dinner around 8 PM.  Drinks prune juice or range and can vary juices.  2.  Staying active or exercising: None  3.  Medication compliance: compliant most of the time.  Interval history  CGM data as reviewed above.  Diabetes regimen as reviewed above.  He has frequent hypoglycemia, he has not been taking NovoLog as much.  Hemoglobin A1c 8.4% today.  GMI on CGM 7.9%.  He has no other complaints today.  He reports taking Trulicity and denies any GI issues.  REVIEW OF SYSTEMS As per history of present illness.   PAST MEDICAL HISTORY: Past Medical History:  Diagnosis Date   Diabetes (HCC)    Diabetes mellitus without complication (HCC)    type 2   ESRD (end stage renal disease) (HCC)    03/09/2015- patient had a kidney transplant   Hypertension  Nausea vomiting and diarrhea 12/10/2015   Peripheral vascular disease (HCC)    Renal disorder    Renal insufficiency     PAST SURGICAL HISTORY: Past Surgical History:  Procedure Laterality Date   ABDOMINAL AORTOGRAM W/LOWER EXTREMITY N/A 09/11/2021   Procedure: ABDOMINAL AORTOGRAM W/LOWER EXTREMITY;  Surgeon: Iran Ouch, MD;  Location: [facility];  Service: Cardiovascular;  Laterality: N/A;   AMPUTATION Right 09/25/2021   Procedure: RIGHT TRANSMETATARSAL AMPUTATION AND APPLY TISSUE GRAFT;  Surgeon: Nadara Mustard, MD;  Location: [facility] OR;  Service: Orthopedics;  Laterality: Right;   AMPUTATION Right 11/16/2021   Procedure: RIGHT AMPUTATION BELOW KNEE WITH WOUND VAC APPLICATION;  Surgeon:  Nadara Mustard, MD;  Location: [facility] OR;  Service: Orthopedics;  Laterality: Right;   BONE BIOPSY Right 03/06/2021   Procedure: BONE BIOPSY;  Surgeon: Vivi Barrack, DPM;  Location: [facility];  Service: Podiatry;  Laterality: Right;   CENTRAL VENOUS CATHETER INSERTION Right 01/20/2020   Procedure: INSERTION CENTRAL LINE ADULT; Tunneled central line;  Surgeon: Lucretia Roers, MD;  Location: [facility];  Service: General;  Laterality: Right;   COLONOSCOPY N/A 12/05/2014   [id number] external and internal hemorrhoid/mild diverticulosis/11 polyps removed   ESOPHAGOGASTRODUODENOSCOPY N/A 12/05/2014   WGN:[id number] duodenitis   GRAFT APPLICATION Right 03/06/2021   Procedure: GRAFT APPLICATION;  Surgeon: Vivi Barrack, DPM;  Location: [facility];  Service: Podiatry;  Laterality: Right;   INCISION AND DRAINAGE OF WOUND Right 08/15/2021   Procedure: IRRIGATION AND DEBRIDEMENT WOUND;  Surgeon: Asencion Islam, DPM;  Location: [facility];  Service: Podiatry;  Laterality: Right;  need to make card not  a irrigation and debridement card   IR FLUORO GUIDE CV LINE RIGHT  03/01/2019   IR PERC TUN PERIT CATH WO PORT S&I /IMAG  06/27/2021   IR REMOVAL TUN CV CATH W/O FL  05/10/2019   IR REMOVAL TUN CV CATH W/O FL  02/29/2020   IR REMOVAL TUN CV CATH W/O FL  07/19/2021   IR US GUIDE VASC ACCESS RIGHT  03/01/2019   IR US GUIDE VASC ACCESS RIGHT  06/27/2021   IRRIGATION AND DEBRIDEMENT ELBOW Left 06/24/2021   Procedure: IRRIGATION AND DEBRIDEMENT ELBOW;  Surgeon: Lyndle Herrlich, MD;  Location: [facility] ORS;  Service: Orthopedics;  Laterality: Left;   KIDNEY TRANSPLANT  03/27/2015   KNEE ARTHROTOMY Right 06/08/2023   Procedure: KNEE ARTHROTOMY;  Surgeon: Oliver Barre, MD;  Location: [facility];  Service: Orthopedics;  Laterality: Right;   Penile Pump Insertion     PERIPHERAL VASCULAR ATHERECTOMY  09/11/2021   Procedure: PERIPHERAL VASCULAR ATHERECTOMY;  Surgeon: Iran Ouch, MD;  Location: [facility];  Service:  Cardiovascular;;  Shockwave Lithotripsy   TEE WITHOUT CARDIOVERSION N/A 01/17/2020   Procedure: TRANSESOPHAGEAL ECHOCARDIOGRAM (TEE) WITH PROPOFOL;  Surgeon: Antoine Poche, MD;  Location: [facility];  Service: Endoscopy;  Laterality: N/A;   WOUND DEBRIDEMENT Right 03/06/2021   Procedure: DEBRIDEMENT WOUND;  Surgeon: Vivi Barrack, DPM;  Location: [facility];  Service: Podiatry;  Laterality: Right;    ALLERGIES: Allergies  Allergen Reactions   Bactrim [Sulfamethoxazole-Trimethoprim]     Runs pt's sugar and phosphorus   Doxycycline Other (See Comments)    Runs up pt's blood sugar and phosphorus   Flagyl [Metronidazole] Rash    Patient having hives to either the vancomycin or flagyl (both infusing together).   Vancomycin Rash    Patient having hives to either vancomycin or flagyl  FAMILY HISTORY:  Family History  Problem Relation Age of Onset   Diabetes Mother    Heart disease Father    Colon cancer Neg Hx     SOCIAL HISTORY: Social History   Socioeconomic History   Marital status: Married    Spouse name: Not on file   Number of children: Not on file   Years of education: Not on file   Highest education level: Not on file  Occupational History   Not on file  Tobacco Use   Smoking status: Never   Smokeless tobacco: Never  Vaping Use   Vaping status: Never Used  Substance and Sexual Activity   Alcohol use: No    Alcohol/week: 0.0 standard drinks of alcohol   Drug use: No   Sexual activity: Yes  Other Topics Concern   Not on file  Social History Narrative   ** Merged History Encounter **       Social Drivers of Health   Financial Resource Strain: Not on file  Food Insecurity: Patient Declined (06/09/2023)   Hunger Vital Sign    Worried About Running Out of Food in the Last Year: Patient declined    Ran Out of Food in the Last Year: Patient declined  Transportation Needs: No Transportation Needs (06/08/2023)   PRAPARE - Scientist, research (physical sciences) (Medical): No    Lack of Transportation (Non-Medical): No  Physical Activity: Not on file  Stress: Not on file  Social Connections: Not on file    MEDICATIONS:  Current Outpatient Medications  Medication Sig Dispense Refill   albuterol (VENTOLIN HFA) 108 (90 Base) MCG/ACT inhaler Inhale 2 puffs into the lungs every 6 (six) hours as needed for wheezing or shortness of breath.     atorvastatin (LIPITOR) 10 MG tablet Take 1 tablet by mouth once daily 30 tablet 0   blood glucose meter kit and supplies KIT Dispense based on patient and insurance preference. Use up to four times daily as directed. (FOR ICD-9 250.00, 250.01). 1 each 0   Blood Glucose Monitoring Suppl (ACCU-CHEK GUIDE ME) w/Device KIT 1 Piece by Does not apply route as directed. 1 kit 0   cloNIDine (CATAPRES - DOSED IN MG/24 HR) 0.3 mg/24hr patch Place 1 patch (0.3 mg total) onto the skin once a week. (Patient taking differently: Place 0.3 mg onto the skin once a week. Place patch on Sunday) 4 patch 12   clopidogrel (PLAVIX) 75 MG tablet Take 75 mg by mouth daily.     clotrimazole (MYCELEX) 10 MG troche Take 10 mg by mouth 5 (five) times daily.     Continuous Blood Gluc Receiver (DEXCOM G7 RECEIVER) DEVI Use to check blood sugar 3 times a day. Dx code e11.65 1 each 1   doxazosin (CARDURA) 4 MG tablet Take 4 mg by mouth 2 (two) times daily.     furosemide (LASIX) 20 MG tablet Take 80 mg by mouth 2 (two) times daily as needed for fluid.     glucose blood (ACCU-CHEK GUIDE) test strip Use as instructed 4 x daily. E11.65 150 each 5   hydrALAZINE (APRESOLINE) 25 MG tablet Take 2 tablets (50 mg total) by mouth 3 (three) times daily.     insulin aspart (NOVOLOG FLEXPEN) 100 UNIT/ML FlexPen Take 0-10 units with meals 3 times a day, maximum 30  units /day. 15 mL 4   latanoprost (XALATAN) 0.005 % ophthalmic solution Place 1 drop into both eyes at bedtime.     Multiple  Vitamin (MULTIVITAMIN) capsule Take 1 capsule by mouth daily.      mycophenolate (MYFORTIC) 360 MG TBEC EC tablet Take 720 mg by mouth 2 (two) times daily.     omeprazole (PRILOSEC) 40 MG capsule Take 40 mg by mouth every morning.     oxyCODONE-acetaminophen (PERCOCET/ROXICET) 5-325 MG tablet Take 1 tablet by mouth every 6 (six) hours as needed for severe pain (pain score 7-10). 10 tablet 0   oxyCODONE-acetaminophen (PERCOCET/ROXICET) 5-325 MG tablet Take 1 tablet by mouth every 6 (six) hours as needed for severe pain (pain score 7-10). 6 tablet 0   predniSONE (DELTASONE) 10 MG tablet Take 5 mg by mouth 2 (two) times daily with a meal.     sevelamer (RENAGEL) 800 MG tablet Take 800 mg by mouth 3 (three) times daily with meals. Swallow tablet whole do not crush ,break,or chew.     tacrolimus (PROGRAF) 0.5 MG capsule Take 3 capsules (1.5 mg total) by mouth 2 (two) times daily.     Vitamin D, Ergocalciferol, (DRISDOL) 50000 units CAPS capsule Take 50,000 Units by mouth See admin instructions. 15th of the month     Continuous Glucose Sensor (DEXCOM G7 SENSOR) MISC USE TO CHECK GLUCOSE THREE TIMES DAILY AS DIRECTED, CHANGE SENSOR EVERY 10 DAYS. 9 each 3   Dulaglutide (TRULICITY) 3 MG/0.5ML SOAJ Inject 3 mg as directed once a week. 6 mL 4   NOVOLIN N FLEXPEN 100 UNIT/ML FlexPen Inject 30 Units into the skin 2 (two) times daily before a meal. Takes 30 UNITS SUBCUTANEOUSLY IN THE MORNING with breakfast AND 30 UNITS with SUPPER.     No current facility-administered medications for this visit.    PHYSICAL EXAM: Vitals:   09/07/23 1159  BP: 128/60  Pulse: 70  Resp: 20  SpO2: 98%  Weight: 223 lb (101.2 kg)     Body mass index is 34.93 kg/m.  Wt Readings from Last 3 Encounters:  09/07/23 223 lb (101.2 kg)  09/07/23 224 lb 12.8 oz (102 kg)  09/04/23 223 lb 8 oz (101.4 kg)    General: Well developed, well nourished male in no apparent distress.  HEENT: AT/[place], no external lesions.  Eyes: Conjunctiva clear and no icterus. Neck: Neck supple  Lungs:  Respirations not labored Neurologic: Alert, oriented, normal speech Extremities / Skin: Dry. No sores or rashes noted. Rt BKA. Psychiatric: Does not appear depressed or anxious   Diabetic Foot Exam - Simple   No data filed    LABS Reviewed Lab Results  Component Value Date   HGBA1C 8.4 (H) 01/27/2023   HGBA1C 7.1 (H) 10/16/2022   HGBA1C 7.9 (H) 06/18/2022   Lab Results  Component Value Date   FRUCTOSAMINE 327 (H) 01/14/2022   Lab Results  Component Value Date   CHOL 140 03/12/2023   HDL 47 03/12/2023   LDLCALC 75 03/12/2023   TRIG 93 03/12/2023   CHOLHDL 3.0 03/12/2023   Lab Results  Component Value Date   MICRALBCREAT 192 (H) 03/12/2023   MICRALBCREAT 29.9 01/17/2022   Lab Results  Component Value Date   CREATININE 5.03 (H) 09/04/2023   Lab Results  Component Value Date   GFR 53.77 (L) 05/13/2022    ASSESSMENT / PLAN  1. Uncontrolled type 2 diabetes mellitus with hyperglycemia, with long-term current use of insulin (HCC)   2. Type 2 diabetes mellitus with other specified complication, with long-term current use of insulin (HCC)     Diabetes Mellitus type 2, complicated by peripheral neuropathy/diabetic foot ulcer  status post right BKA. - Diabetic status / severity: Uncontrolled.  Lab Results  Component Value Date   HGBA1C 8.4 (H) 01/27/2023    - Hemoglobin A1c goal : <7%  Discussed about compliance of the insulin.  Discussed about difference between Novolin N and at the NovoLog insulin.  He has not been fully compliant with NovoLog with meals.  He is having postprandial frequent hypoglycemia.  Discussed about using NovoLog frequently, in the range of 0 to 10 units, likely needs more meals may not need to take NovoLog.  Depending on the meal size and carbohydrate content advised to take NovoLog up to 10 units.  Discussed in detail.   Adjusted insulin regimen as follows.  - Medications: Adjusted as follows. Diabetes regimen: Novolin N 30 units with  breakfast and 30 unit with dinner/supper. Adjust Novolog 0 - 10 units with meals three times days, with breakfast, lunch and dinner. Continue trulicity 3mg  weekly.  - Home glucose testing: Dexcom G7/CGM and check as needed.  Dexcom G7 sensors represcribed. - Discussed/ Gave Hypoglycemia treatment plan.  # Consult : not required at this time.   # Annual urine for microalbuminuria/ creatinine ratio, + microalbuminuria, not on ARB.  Following with nephrology. Last  Lab Results  Component Value Date   MICRALBCREAT 192 (H) 03/12/2023    # Foot check nightly / neuropathy, continue gabapentin.  # Annual dilated diabetic eye exams.  Advised to have diabetic eye exam.  - Diet: Eat reasonable portion sizes to promote a healthy weight  2. Blood pressure  -  BP Readings from Last 1 Encounters:  09/07/23 128/60    - Control is in target.  - No change in current plans.  3. Lipid status / Hyperlipidemia - Last  Lab Results  Component Value Date   LDLCALC 75 03/12/2023   - Continue atorvastatin 10 mg daily.  Managed by primary care provider.  Diagnoses and all orders for this visit:  Uncontrolled type 2 diabetes mellitus with hyperglycemia, with long-term current use of insulin (HCC) -     Dulaglutide (TRULICITY) 3 MG/0.5ML SOAJ; Inject 3 mg as directed once a week. -     NOVOLIN N FLEXPEN 100 UNIT/ML FlexPen; Inject 30 Units into the skin 2 (two) times daily before a meal. Takes 30 UNITS SUBCUTANEOUSLY IN THE MORNING with breakfast AND 30 UNITS with SUPPER. -     insulin aspart (NOVOLOG FLEXPEN) 100 UNIT/ML FlexPen; Take 0-10 units with meals 3 times a day, maximum 30  units /day.  Type 2 diabetes mellitus with other specified complication, with long-term current use of insulin (HCC) -     Continuous Glucose Sensor (DEXCOM G7 SENSOR) MISC; USE TO CHECK GLUCOSE THREE TIMES DAILY AS DIRECTED, CHANGE SENSOR EVERY 10 DAYS.    DISPOSITION Follow up in clinic in 3 months suggested.    All questions answered and patient verbalized understanding of the plan.  Raymond Glori Machnik, MD [facility] [address] [place], [address] [place], [place] [address] Phone # [phone]  At least part of this note was generated using voice recognition software. Inadvertent word errors may have occurred, which were not recognized during the proofreading process.

## 2023-09-08 ENCOUNTER — Telehealth: Payer: Self-pay | Admitting: Pharmacy Technician

## 2023-09-08 ENCOUNTER — Encounter: Payer: Self-pay | Admitting: Endocrinology

## 2023-09-08 ENCOUNTER — Other Ambulatory Visit (HOSPITAL_COMMUNITY): Payer: Self-pay

## 2023-09-08 ENCOUNTER — Encounter: Payer: Self-pay | Admitting: Nurse Practitioner

## 2023-09-08 LAB — HEMOGLOBIN A1C
Est. average glucose Bld gHb Est-mCnc: 160 mg/dL
Hgb A1c MFr Bld: 7.2 % — ABNORMAL HIGH (ref 4.8–5.6)

## 2023-09-08 LAB — BMP8+EGFR
BUN/Creatinine Ratio: 16 (ref 10–24)
BUN: 69 mg/dL — ABNORMAL HIGH (ref 8–27)
CO2: 16 mmol/L — ABNORMAL LOW (ref 20–29)
Calcium: 8.8 mg/dL (ref 8.6–10.2)
Chloride: 115 mmol/L — ABNORMAL HIGH (ref 96–106)
Creatinine, Ser: 4.4 mg/dL — ABNORMAL HIGH (ref 0.76–1.27)
Glucose: 206 mg/dL — ABNORMAL HIGH (ref 70–99)
Potassium: 5.9 mmol/L — ABNORMAL HIGH (ref 3.5–5.2)
Sodium: 147 mmol/L — ABNORMAL HIGH (ref 134–144)
eGFR: 14 mL/min/{1.73_m2} — ABNORMAL LOW (ref >59)

## 2023-09-08 LAB — LIPID PANEL
Chol/HDL Ratio: 2.8 ratio (ref 0.0–5.0)
Cholesterol, Total: 168 mg/dL (ref 100–199)
HDL: 59 mg/dL (ref >39)
LDL Chol Calc (NIH): 93 mg/dL (ref 0–99)
Triglycerides: 84 mg/dL (ref 0–149)
VLDL Cholesterol Cal: 16 mg/dL (ref 5–40)

## 2023-09-08 NOTE — Telephone Encounter (Signed)
 Pharmacy Patient Advocate Encounter   Received notification from CoverMyMeds that prior authorization for Trulicity 3MG /0.5ML auto-injectors is required/requested.   Insurance verification completed.   The patient is insured through [facility] .   Per test claim: The current 84 day co-pay is, $0.00.  No PA needed at this time. This test claim was processed through [facility]- copay amounts may vary at other pharmacies due to pharmacy/plan contracts, or as the patient moves through the different stages of their insurance plan.     **Spoke to the pharmacy.**

## 2023-09-09 ENCOUNTER — Encounter: Payer: Self-pay | Admitting: Oncology

## 2023-09-09 ENCOUNTER — Inpatient Hospital Stay

## 2023-09-09 ENCOUNTER — Other Ambulatory Visit: Payer: Self-pay

## 2023-09-09 DIAGNOSIS — E1169 Type 2 diabetes mellitus with other specified complication: Secondary | ICD-10-CM

## 2023-09-09 MED ORDER — DEXCOM G7 SENSOR MISC: 3 refills | Status: DC

## 2023-09-09 NOTE — Telephone Encounter (Signed)
 Encounter created in error

## 2023-09-10 ENCOUNTER — Inpatient Hospital Stay: Payer: Self-pay | Admitting: Family Medicine

## 2023-09-13 NOTE — Assessment & Plan Note (Signed)
 Chronic, will check A1c. Continue current medications.

## 2023-09-13 NOTE — Assessment & Plan Note (Signed)
 Cholesterol levels are stable. Continue current medications

## 2023-09-13 NOTE — Assessment & Plan Note (Signed)
 Injured his ribs with his fall on a carpet rug, advised to remove any throw rugs. He is feeling better and was seen at the ER. Mild tenderness to ribs on left side.

## 2023-09-13 NOTE — Assessment & Plan Note (Signed)
 Blood pressure is well controlled, continue current medications.

## 2023-09-13 NOTE — Assessment & Plan Note (Signed)
 he is encouraged to strive for BMI less than 30 to decrease cardiac risk. Advised to aim for at least 150 minutes of exercise per week.

## 2023-09-17 ENCOUNTER — Encounter: Payer: Self-pay | Admitting: Orthopedic Surgery

## 2023-09-17 ENCOUNTER — Ambulatory Visit (INDEPENDENT_AMBULATORY_CARE_PROVIDER_SITE_OTHER): Admitting: Orthopedic Surgery

## 2023-09-17 DIAGNOSIS — Z89511 Acquired absence of right leg below knee: Secondary | ICD-10-CM | POA: Diagnosis not present

## 2023-09-17 NOTE — Progress Notes (Signed)
 Office Visit Note   Patient: Raymond [name]           Date of Birth: [birth year]           MRN: [id number] Visit Date: 09/17/2023              Requested by: [name], FNP [address] [place],  [place] [address] PCP: [name], FNP  Chief Complaint  Patient presents with   Right Leg - Follow-up    Hx Right BKA      HPI: Patient is a 63 year old gentleman right transtibial amputation with pain and instability with the socket secondary to decreased volume in the residual limb.  He states his socket is over a-year-old.  Assessment & Plan: Visit Diagnoses:  1. History of right below knee amputation [facility])     Plan: Patient is provided a prescription for a new socket liner and supplies.  Follow-Up Instructions: No follow-ups on file.   Ortho Exam  Patient is alert, oriented, no adenopathy, well-dressed, normal affect, normal respiratory effort. Examination patient is subsiding into the socket there is no rotational stability.  The liner is torn.  No open ulcers.  Patient is an existing right transtibial  amputee.  Patient's current comorbidities are not expected to impact the ability to function with the prescribed prosthesis. Patient verbally communicates a strong desire to use a prosthesis. Patient currently requires mobility aids to ambulate without a prosthesis.  Expects not to use mobility aids with a new prosthesis. Patient is expected to resume or reach their K Level within 6 months. Patient was active before the amputation and independent with stairs, uneven terrain, varying cadence, and a community ambulator.  Patient is a K2 level ambulator that will use a prosthesis to walk around their home and the community over low level environmental barriers.      Imaging: No results found. No images are attached to the encounter.  Labs: Lab Results  Component Value Date   HGBA1C 7.2 (H) 09/07/2023   HGBA1C 8.4 (H) 01/27/2023   HGBA1C  7.1 (H) 10/16/2022   ESRSEDRATE 120 (H) 06/07/2023   ESRSEDRATE 88 (H) 08/14/2021   ESRSEDRATE 47 (H) 07/12/2021   CRP 22.8 (H) 06/07/2023   CRP 25.2 (H) 08/14/2021   CRP 11.6 (H) 06/27/2021   REPTSTATUS 06/13/2023 FINAL 06/08/2023   GRAMSTAIN  06/08/2023    MODERATE WBC PRESENT, PREDOMINANTLY PMN NO ORGANISMS SEEN    CULT  06/08/2023    No growth aerobically or anaerobically. Performed at [facility], [address] [place], [place] [address]    [facility] STAPHYLOCOCCUS LUGDUNENSIS 09/25/2021     Lab Results  Component Value Date   ALBUMIN  3.5 09/04/2023   ALBUMIN  2.2 (L) 06/08/2023   ALBUMIN  2.8 (L) 03/24/2023   PREALBUMIN <5 (L) 08/14/2021    Lab Results  Component Value Date   MG 1.8 06/10/2023   MG 2.1 06/08/2023   MG 1.5 (L) 11/29/2021   Lab Results  Component Value Date   VD25OH 16.3 (L) 10/16/2022   VD25OH 23.8 (L) 12/11/2020    Lab Results  Component Value Date   PREALBUMIN <5 (L) 08/14/2021      Latest Ref Rng & Units 09/04/2023   11:43 AM 06/10/2023    8:43 PM 06/10/2023    5:25 AM  CBC EXTENDED  WBC 4.0 - 10.5 K/uL 5.7  8.0  6.2   RBC 4.22 - 5.81 MIL/uL 4.41  3.62  3.51   Hemoglobin  13.0 - 17.0 g/dL 9.9  8.4  8.1   HCT 21.3 - 52.0 % 35.9  30.1  29.7   Platelets 150 - 400 K/uL 179  323  322   NEUT# 1.7 - 7.7 K/uL 3.1   3.8   Lymph# 0.7 - 4.0 K/uL 1.9   1.5      There is no height or weight on file to calculate BMI.  Orders:  No orders of the defined types were placed in this encounter.  No orders of the defined types were placed in this encounter.    Procedures: No procedures performed  Clinical Data: No additional findings.  ROS:  All other systems negative, except as noted in the HPI. Review of Systems  Objective: Vital Signs: There were no vitals taken for this visit.  Specialty Comments:  No specialty comments available.  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/2023   AKI (acute kidney injury) (HCC) 03/24/2023   Perinephric hematoma of transplanted kidney 03/24/2023   Acute blood loss anemia 03/24/2023   History of bleeding following renal biopsy 03/24/2023   Sinus drainage 03/24/2023   COVID-19 vaccination declined 03/24/2023   Class 2 obesity due to excess calories with body mass index (BMI) of 35.0 to 35.9 in adult 03/24/2023   Need for influenza vaccination 03/24/2023   Dermatitis 11/13/2022   Class 2 obesity due to excess calories with body mass index (BMI) of 37.0 to 37.9 in adult 11/13/2022   Rash 11/13/2022   Other fatigue 10/16/2022   Acquired absence of right leg below knee (HCC) 06/18/2022   CKD (chronic kidney disease) 06/18/2022   Cutaneous abscess of right foot    Hyperkalemia 08/28/2021   Obesity (BMI 30-39.9) 08/28/2021   History of anemia due to chronic kidney disease 08/28/2021   Diabetic foot ulcer (HCC) 08/14/2021   Hypomagnesemia 06/25/2021   Peripheral vascular disease (HCC) 04/24/2020   Thrombophlebitis of superficial veins of right lower extremity    Difficult intravenous access    MRSA bacteremia    Diabetic foot infection (HCC)    Infected wound 01/12/2020   Critical lower limb ischemia (HCC) 02/02/2019   Personal history of noncompliance with medical treatment, presenting hazards to health 10/07/2018   Critical limb ischemia with history of revascularization of same extremity (HCC)  07/27/2018   Mixed hyperlipidemia 02/19/2018   History of renal transplant 02/19/2018   Conductive hearing loss, bilateral 08/31/2017   Nuclear sclerotic cataract of right eye 10/07/2016   Pseudophakia of left eye 10/07/2016   Hypertension due to endocrine disorder 03/04/2016   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 (HCC) 03/04/2016   Renal failure 03/04/2016   Proliferative diabetic retinopathy with macular edema associated with type 2 diabetes mellitus (HCC) 03/04/2016   Immunosuppression (HCC) 12/10/2015   Nausea vomiting and diarrhea 12/10/2015   Type 2 diabetes mellitus with stage 3b chronic kidney  disease, without long-term current use of insulin  (HCC) 12/10/2015   Benign hypertension with chronic kidney disease 12/10/2015   Anemia 12/10/2015   Encounter for screening colonoscopy 11/15/2014   GERD (gastroesophageal reflux disease) 11/15/2014   Past Medical History:  Diagnosis Date   Diabetes (HCC)    Diabetes mellitus without complication (HCC)    type 2   ESRD (end stage renal disease) (HCC)    03/09/2015- patient had a kidney transplant   Hypertension    Nausea vomiting and diarrhea 12/10/2015   Peripheral vascular disease (HCC)    Renal disorder    Renal insufficiency     Family History  Problem Relation Age of Onset   Diabetes Mother    Heart disease Father    Colon cancer Neg Hx     Past Surgical History:  Procedure Laterality Date   ABDOMINAL AORTOGRAM W/LOWER EXTREMITY N/A 09/11/2021   Procedure: ABDOMINAL AORTOGRAM W/LOWER EXTREMITY;  Surgeon: Wenona Hamilton, MD;  Location: [facility];  Service: Cardiovascular;  Laterality: N/A;   AMPUTATION Right 09/25/2021   Procedure: RIGHT TRANSMETATARSAL AMPUTATION AND APPLY TISSUE GRAFT;  Surgeon: Timothy Ford, MD;  Location: [facility] OR;  Service: Orthopedics;  Laterality: Right;   AMPUTATION Right 11/16/2021   Procedure: RIGHT AMPUTATION BELOW  KNEE WITH WOUND VAC APPLICATION;  Surgeon: Timothy Ford, MD;  Location: [facility] OR;  Service: Orthopedics;  Laterality: Right;   BONE BIOPSY Right 03/06/2021   Procedure: BONE BIOPSY;  Surgeon: Charity Conch, DPM;  Location: [facility] ORS;  Service: Podiatry;  Laterality: Right;   CENTRAL VENOUS CATHETER INSERTION Right 01/20/2020   Procedure: INSERTION CENTRAL LINE ADULT; Tunneled central line;  Surgeon: Awilda Bogus, MD;  Location: [facility];  Service: General;  Laterality: Right;   COLONOSCOPY N/A 12/05/2014   WUJ:[id number] external and internal hemorrhoid/mild diverticulosis/11 polyps removed   ESOPHAGOGASTRODUODENOSCOPY N/A 12/05/2014   GNF:AOZH duodenitis   GRAFT APPLICATION Right 03/06/2021   Procedure: GRAFT APPLICATION;  Surgeon: Charity Conch, DPM;  Location: [facility];  Service: Podiatry;  Laterality: Right;   INCISION AND DRAINAGE OF WOUND Right 08/15/2021   Procedure: IRRIGATION AND DEBRIDEMENT WOUND;  Surgeon: Lizzie Riis, DPM;  Location: [facility];  Service: Podiatry;  Laterality: Right;  need to make card not  a irrigation and debridement card   IR FLUORO GUIDE CV LINE RIGHT  03/01/2019   IR PERC TUN PERIT CATH WO PORT S&I /IMAG  06/27/2021   IR REMOVAL TUN CV CATH W/O FL  05/10/2019   IR REMOVAL TUN CV CATH W/O FL  02/29/2020   IR REMOVAL TUN CV CATH W/O FL  07/19/2021   IR US  GUIDE VASC ACCESS RIGHT  03/01/2019   IR US  GUIDE VASC ACCESS RIGHT  06/27/2021   IRRIGATION AND DEBRIDEMENT ELBOW Left 06/24/2021   Procedure: IRRIGATION AND DEBRIDEMENT ELBOW;  Surgeon: Jerlyn Moons, MD;  Location: [facility] ORS;  Service: Orthopedics;  Laterality: Left;   KIDNEY TRANSPLANT  03/27/2015   KNEE ARTHROTOMY Right 06/08/2023   Procedure: KNEE ARTHROTOMY;  Surgeon: Tonita Frater, MD;  Location: [facility];  Service: Orthopedics;  Laterality: Right;   Penile Pump Insertion     PERIPHERAL VASCULAR ATHERECTOMY  09/11/2021   Procedure: PERIPHERAL VASCULAR ATHERECTOMY;  Surgeon: Wenona Hamilton, MD;   Location: [facility];  Service: Cardiovascular;;  Shockwave Lithotripsy   TEE WITHOUT CARDIOVERSION N/A 01/17/2020   Procedure: TRANSESOPHAGEAL ECHOCARDIOGRAM (TEE) WITH PROPOFOL ;  Surgeon: Laurann Pollock, MD;  Location: [facility]  END[facility];  Service: Endoscopy;  Laterality: N/A;   WOUND DEBRIDEMENT Right 03/06/2021   Procedure: DEBRIDEMENT WOUND;  Surgeon: Charity Conch, DPM;  Location: [facility];  Service: Podiatry;  Laterality: Right;   Social History   Occupational History   Not on file  Tobacco Use   Smoking status: Never   Smokeless tobacco: Never  Vaping Use   Vaping status: Never Used  Substance and Sexual Activity   Alcohol use: No    Alcohol/week: 0.0 standard drinks of alcohol   Drug use: No   Sexual activity: Yes

## 2023-09-25 ENCOUNTER — Inpatient Hospital Stay

## 2023-09-29 ENCOUNTER — Inpatient Hospital Stay: Attending: Oncology

## 2023-09-29 VITALS — BP 222/90 | HR 74 | Temp 96.7°F | Resp 18

## 2023-09-29 DIAGNOSIS — D631 Anemia in chronic kidney disease: Secondary | ICD-10-CM | POA: Insufficient documentation

## 2023-09-29 DIAGNOSIS — N189 Chronic kidney disease, unspecified: Secondary | ICD-10-CM | POA: Insufficient documentation

## 2023-09-29 DIAGNOSIS — I152 Hypertension secondary to endocrine disorders: Secondary | ICD-10-CM

## 2023-09-29 MED ORDER — AMLODIPINE BESYLATE 5 MG PO TABS
10.0000 mg | ORAL_TABLET | Freq: Once | ORAL | Status: AC
  Administered 2023-09-29: 10 mg via ORAL
  Filled 2023-09-29: qty 2

## 2023-09-29 NOTE — Progress Notes (Addendum)
 Due to severely elevated blood pressure today, we will postpone IV iron and reschedule this for a later date. Patient sent home with the following instructions:  ELEVATED BLOOD PRESSURE:  Please take your blood pressure medications as soon as you return home. Recheck your blood pressure at home 30 minutes after taking your medication. If your blood pressure remains > 180/100, please go immediately to the emergency department. Contact your primary care provider if your blood pressure remains >160/90.   If you notice any "red flag" symptoms (abnormal headache, dizziness, blurry vision, strokelike symptoms, chest pain, or difficulty breathing), call 911 and seek immediate medical attention.    Sonnie Dusky, PA-C 09/29/23 11:24 AM

## 2023-09-29 NOTE — Patient Instructions (Signed)
 ELEVATED BLOOD PRESSURE:  Please take your blood pressure medications as soon as you return home. Recheck your blood pressure at home 30 minutes after taking your medication. If your blood pressure remains > 180/100, please go immediately to the emergency department. Contact your primary care provider if your blood pressure remains >160/90.   If you notice any "red flag" symptoms (abnormal headache, dizziness, blurry vision, strokelike symptoms, chest pain, or difficulty breathing), call 911 and seek immediate medical attention.

## 2023-09-29 NOTE — Progress Notes (Signed)
 Patient presents today 252/106 hasn't taken his blood pressure medication yet. Blood sugar today 162, per patient's monitor. 2nd check 203/84. Patient denies dizziness or headaches, reports BP sometimes gets that high when he has not taking BP medication. Dr. Orvis Blare made aware.  Per Dr. Orvis Blare give patient 10 mg of norvasc  and check BP after 15 mins. BP recheck 222/90 Dr. Orvis Blare and Sheril Dines PA, made aware. Sheril Dines assessed patient in room and suggest no iron infusion today.   Sheril Dines PA gave the following discharge instructions.  Patient sent home with the following instructions.   ELEVATED BLOOD PRESSURE:   Please take your blood pressure medications as soon as you return home.  Recheck your blood pressure at home 30 minutes after taking your medication.  If your blood pressure remains > 180/100, please go immediately to the emergency department.  Contact your primary care provider if your blood pressure remains >160/90.   If you notice any "red flag" symptoms (abnormal headache, dizziness, blurry vision, strokelike symptoms, chest pain, or difficulty breathing), call 911 and seek immediate medical attention.   Patient discharged in satisfactory condition and rescheduled appointment.

## 2023-09-30 ENCOUNTER — Other Ambulatory Visit

## 2023-10-02 ENCOUNTER — Inpatient Hospital Stay

## 2023-10-02 ENCOUNTER — Inpatient Hospital Stay: Admitting: Oncology

## 2023-10-02 VITALS — BP 133/54 | HR 71 | Temp 96.9°F | Resp 18

## 2023-10-02 DIAGNOSIS — N189 Chronic kidney disease, unspecified: Secondary | ICD-10-CM | POA: Diagnosis not present

## 2023-10-02 DIAGNOSIS — D649 Anemia, unspecified: Secondary | ICD-10-CM

## 2023-10-02 MED ORDER — CETIRIZINE HCL 10 MG/ML IV SOLN
10.0000 mg | Freq: Once | INTRAVENOUS | Status: AC
Start: 2023-10-02 — End: 2023-10-02
  Administered 2023-10-02: 10 mg via INTRAVENOUS
  Filled 2023-10-02: qty 1

## 2023-10-02 MED ORDER — SODIUM CHLORIDE 0.9 % IV SOLN: INTRAVENOUS | Status: DC

## 2023-10-02 MED ORDER — FERRIC DERISOMALTOSE(ONE DOSE) 1000 MG/10ML IV SOLN
1000.0000 mg | Freq: Once | INTRAVENOUS | Status: AC
  Administered 2023-10-02: 1000 mg via INTRAVENOUS
  Filled 2023-10-02: qty 1000

## 2023-10-02 MED ORDER — ACETAMINOPHEN 325 MG PO TABS
650.0000 mg | ORAL_TABLET | Freq: Once | ORAL | Status: AC
  Administered 2023-10-02: 650 mg via ORAL
  Filled 2023-10-02: qty 2

## 2023-10-02 NOTE — Patient Instructions (Signed)
 [facility] [place] - A DEPT OF [facility]  Discharge Instructions: Thank you for choosing [facility] to provide your oncology and hematology care.  If you have a lab appointment with the Cancer Center - please note that after April 8th, 2024, all labs will be drawn in the cancer center.  You do not have to check in or register with the main entrance as you have in the past but will complete your check-in in the cancer center.  Wear comfortable clothing and clothing appropriate for easy access to any Portacath or PICC line.   We strive to give you quality time with your provider. You may need to reschedule your appointment if you arrive late (15 or more minutes).  Arriving late affects you and other patients whose appointments are after yours.  Also, if you miss three or more appointments without notifying the office, you may be dismissed from the clinic at the provider's discretion.      For prescription refill requests, have your pharmacy contact our office and allow 72 hours for refills to be completed.    Today you received the following chemotherapy and/or immunotherapy agents Monoferric      To help prevent nausea and vomiting after your treatment, we encourage you to take your nausea medication as directed.  BELOW ARE SYMPTOMS THAT SHOULD BE REPORTED IMMEDIATELY: *FEVER GREATER THAN 100.4 F (38 C) OR HIGHER *CHILLS OR SWEATING *NAUSEA AND VOMITING THAT IS NOT CONTROLLED WITH YOUR NAUSEA MEDICATION *UNUSUAL SHORTNESS OF BREATH *UNUSUAL BRUISING OR BLEEDING *URINARY PROBLEMS (pain or burning when urinating, or frequent urination) *BOWEL PROBLEMS (unusual diarrhea, constipation, pain near the anus) TENDERNESS IN MOUTH AND THROAT WITH OR WITHOUT PRESENCE OF ULCERS (sore throat, sores in mouth, or a toothache) UNUSUAL RASH, SWELLING OR PAIN  UNUSUAL VAGINAL DISCHARGE OR ITCHING   Items with * indicate a potential emergency and should be followed  up as soon as possible or go to the Emergency Department if any problems should occur.  Please show the CHEMOTHERAPY ALERT CARD or IMMUNOTHERAPY ALERT CARD at check-in to the Emergency Department and triage nurse.  Should you have questions after your visit or need to cancel or reschedule your appointment, please contact [facility] [place] - A DEPT OF [facility]igh[facility] [phone]  and follow the prompts.  Office hours are 8:00 a.m. to 4:30 p.m. Monday - Friday. Please note that voicemails left after 4:00 p.m. may not be returned until the following business day.  We are closed weekends and major holidays. You have access to a nurse at all times for urgent questions. Please call the main number to the clinic [phone] and follow the prompts.  For any non-urgent questions, you may also contact your provider using MyChart. We now offer e-Visits for anyone 54 and older to request care online for non-urgent symptoms. For details visit [email or web address].   Also download the MyChart app! Go to the app store, search "MyChart", open the app, select [place], and log in with your MyChart username and password.

## 2023-10-02 NOTE — Progress Notes (Signed)
 Patient presents today Monoferric infusion per providers order.  Vital signs within parameters for treatment.  Patient has no new complaints at this time.  Peripheral IV started and blood return noted pre and post infusion.  Stable during infusion without adverse affects.  Vital signs stable.  No complaints at this time.  Discharge from clinic ambulatory in stable condition.  Alert and oriented X 3.  Follow up with [facility] as scheduled.

## 2023-10-08 ENCOUNTER — Telehealth: Payer: Self-pay

## 2023-10-08 ENCOUNTER — Inpatient Hospital Stay

## 2023-10-08 NOTE — Telephone Encounter (Signed)
 Called patient to see how long she had medicare- if less than a year she needs Welcome to medicare with Provider- If over a year patient needs AWV ( CAN BE ON FRIDAY CMA WELLNESS SCH)- LVM  PATIENT NEEDS AWV- CAN BE VIRTUAL.

## 2023-10-15 ENCOUNTER — Ambulatory Visit: Admitting: Oncology

## 2023-10-29 ENCOUNTER — Other Ambulatory Visit: Payer: Self-pay

## 2023-10-29 DIAGNOSIS — R7989 Other specified abnormal findings of blood chemistry: Secondary | ICD-10-CM

## 2023-10-29 DIAGNOSIS — D649 Anemia, unspecified: Secondary | ICD-10-CM

## 2023-10-29 DIAGNOSIS — N185 Chronic kidney disease, stage 5: Secondary | ICD-10-CM

## 2023-10-30 ENCOUNTER — Inpatient Hospital Stay

## 2023-11-02 ENCOUNTER — Inpatient Hospital Stay: Attending: Oncology

## 2023-11-02 DIAGNOSIS — E1122 Type 2 diabetes mellitus with diabetic chronic kidney disease: Secondary | ICD-10-CM | POA: Diagnosis not present

## 2023-11-02 DIAGNOSIS — R7989 Other specified abnormal findings of blood chemistry: Secondary | ICD-10-CM

## 2023-11-02 DIAGNOSIS — N185 Chronic kidney disease, stage 5: Secondary | ICD-10-CM

## 2023-11-02 DIAGNOSIS — D631 Anemia in chronic kidney disease: Secondary | ICD-10-CM | POA: Insufficient documentation

## 2023-11-02 DIAGNOSIS — D509 Iron deficiency anemia, unspecified: Secondary | ICD-10-CM | POA: Insufficient documentation

## 2023-11-02 DIAGNOSIS — Z940 Kidney transplant status: Secondary | ICD-10-CM | POA: Diagnosis not present

## 2023-11-02 DIAGNOSIS — N189 Chronic kidney disease, unspecified: Secondary | ICD-10-CM | POA: Insufficient documentation

## 2023-11-02 DIAGNOSIS — I129 Hypertensive chronic kidney disease with stage 1 through stage 4 chronic kidney disease, or unspecified chronic kidney disease: Secondary | ICD-10-CM | POA: Diagnosis not present

## 2023-11-02 DIAGNOSIS — R768 Other specified abnormal immunological findings in serum: Secondary | ICD-10-CM | POA: Diagnosis not present

## 2023-11-02 DIAGNOSIS — D649 Anemia, unspecified: Secondary | ICD-10-CM

## 2023-11-02 LAB — CBC WITH DIFFERENTIAL/PLATELET
Abs Immature Granulocytes: 0.16 10*3/uL — ABNORMAL HIGH (ref 0.00–0.07)
Basophils Absolute: 0 10*3/uL (ref 0.0–0.1)
Basophils Relative: 0 %
Eosinophils Absolute: 0.1 10*3/uL (ref 0.0–0.5)
Eosinophils Relative: 1 %
HCT: 41.2 % (ref 39.0–52.0)
Hemoglobin: 12 g/dL — ABNORMAL LOW (ref 13.0–17.0)
Immature Granulocytes: 3 %
Lymphocytes Relative: 19 %
Lymphs Abs: 1.1 10*3/uL (ref 0.7–4.0)
MCH: 24.2 pg — ABNORMAL LOW (ref 26.0–34.0)
MCHC: 29.1 g/dL — ABNORMAL LOW (ref 30.0–36.0)
MCV: 83.2 fL (ref 80.0–100.0)
Monocytes Absolute: 0.4 10*3/uL (ref 0.1–1.0)
Monocytes Relative: 8 %
Neutro Abs: 4.1 10*3/uL (ref 1.7–7.7)
Neutrophils Relative %: 69 %
Platelets: 213 10*3/uL (ref 150–400)
RBC: 4.95 MIL/uL (ref 4.22–5.81)
RDW: 19.1 % — ABNORMAL HIGH (ref 11.5–15.5)
WBC: 5.9 10*3/uL (ref 4.0–10.5)
nRBC: 0.3 % — ABNORMAL HIGH (ref 0.0–0.2)

## 2023-11-02 LAB — COMPREHENSIVE METABOLIC PANEL WITH GFR
ALT: 18 U/L (ref 0–44)
AST: 10 U/L — ABNORMAL LOW (ref 15–41)
Albumin: 3.3 g/dL — ABNORMAL LOW (ref 3.5–5.0)
Alkaline Phosphatase: 115 U/L (ref 38–126)
Anion gap: 11 (ref 5–15)
BUN: 96 mg/dL — ABNORMAL HIGH (ref 8–23)
CO2: 17 mmol/L — ABNORMAL LOW (ref 22–32)
Calcium: 9 mg/dL (ref 8.9–10.3)
Chloride: 114 mmol/L — ABNORMAL HIGH (ref 98–111)
Creatinine, Ser: 6.34 mg/dL — ABNORMAL HIGH (ref 0.61–1.24)
GFR, Estimated: 9 mL/min — ABNORMAL LOW (ref >60)
Glucose, Bld: 173 mg/dL — ABNORMAL HIGH (ref 70–99)
Potassium: 3.9 mmol/L (ref 3.5–5.1)
Sodium: 142 mmol/L (ref 135–145)
Total Bilirubin: 0.3 mg/dL (ref 0.0–1.2)
Total Protein: 7.8 g/dL (ref 6.5–8.1)

## 2023-11-02 LAB — VITAMIN B12: Vitamin B-12: 551 pg/mL (ref 180–914)

## 2023-11-02 LAB — IRON AND TIBC
Iron: 44 ug/dL — ABNORMAL LOW (ref 45–182)
Saturation Ratios: 20 % (ref 17.9–39.5)
TIBC: 218 ug/dL — ABNORMAL LOW (ref 250–450)
UIBC: 174 ug/dL

## 2023-11-02 LAB — FERRITIN: Ferritin: 538 ng/mL — ABNORMAL HIGH (ref 24–336)

## 2023-11-02 LAB — FOLATE: Folate: >40 ng/mL (ref >5.9)

## 2023-11-06 ENCOUNTER — Inpatient Hospital Stay (HOSPITAL_BASED_OUTPATIENT_CLINIC_OR_DEPARTMENT_OTHER): Admitting: Oncology

## 2023-11-06 VITALS — BP 194/65 | HR 78 | Temp 97.9°F | Resp 19 | Wt 208.8 lb

## 2023-11-06 DIAGNOSIS — R768 Other specified abnormal immunological findings in serum: Secondary | ICD-10-CM | POA: Insufficient documentation

## 2023-11-06 DIAGNOSIS — N185 Chronic kidney disease, stage 5: Secondary | ICD-10-CM | POA: Diagnosis not present

## 2023-11-06 DIAGNOSIS — D500 Iron deficiency anemia secondary to blood loss (chronic): Secondary | ICD-10-CM

## 2023-11-06 DIAGNOSIS — D509 Iron deficiency anemia, unspecified: Secondary | ICD-10-CM | POA: Diagnosis not present

## 2023-11-06 NOTE — Assessment & Plan Note (Signed)
 Likely secondary to chronic kidney disease.  FLC ratio 1.92  -Will obtain 24-hour urine UPEP, UIFC, urine free light chains to rule out light chain only disease

## 2023-11-06 NOTE — Progress Notes (Signed)
 [facility] at [facility]  HEMATOLOGY FOLLOW-UP VISIT  [name], FNP  REASON FOR FOLLOW-UP: Iron deficiency anemia  ASSESSMENT & PLAN:  Patient is a 63 y.o. male following for anemia  Anemia Anemia is likely multifactorial at this time.  Iron deficiency anemia and anemia of chronic kidney disease Patient has chronic kidney disease and was on dialysis previously and is off of it since the past few months.  Denied erythropoietin shot use. Iron labs and anemia improved with IV iron supplementation  - Continue oral iron every other day - Please follow-up with GI for endoscopy and colonoscopy - TSAT below the goal of 25-34 chronic kidney disease but ferritin above 500.  Would not administer IV iron at this time - Please follow-up with the nephrology for worsening kidney function  Return to clinic in 2 months with labs  CKD (chronic kidney disease) Patient has chronic kidney disease secondary to diabetes and hypertension S/p kidney transplant Currently not on dialysis  - Continue to follow with nephrology - If iron deficiency is fixed, patient still anemic, will consider erythropoietin shots.  TSAT goal of 25-30.  Elevated serum immunoglobulin free light chains Likely secondary to chronic kidney disease.  FLC ratio 1.92  -Will obtain 24-hour urine UPEP, UIFC, urine free light chains to rule out light chain only disease   Orders Placed This Encounter  Procedures   24 hr Ur UPEP/UIFE/Light Chains/TP    Standing Status:   Future    Expected Date:   11/06/2023    Expiration Date:   11/05/2024   Ferritin    Standing Status:   Future    Expected Date:   01/04/2024    Expiration Date:   04/03/2024   Folate    Standing Status:   Future    Expected Date:   01/04/2024    Expiration Date:   04/03/2024   Vitamin B12    Standing Status:   Future    Expected Date:   01/04/2024    Expiration Date:   04/03/2024   CBC with Differential/Platelet    Standing  Status:   Future    Expected Date:   01/04/2024    Expiration Date:   04/03/2024   Comprehensive metabolic panel with GFR    Standing Status:   Future    Expected Date:   01/04/2024    Expiration Date:   04/03/2024   Iron and TIBC    Standing Status:   Future    Expected Date:   01/04/2024    Expiration Date:   04/03/2024   Soluble transferrin receptor    Standing Status:   Future    Expected Date:   01/04/2024    Expiration Date:   04/03/2024    The total time spent in the appointment was 20 minutes encounter with patients including review of chart and various tests results, discussions about plan of care and coordination of care plan   All questions were answered. The patient knows to call the clinic with any problems, questions or concerns. No barriers to learning was detected.  Eduardo Grade, MD 6/13/20252:23 PM   SUMMARY OF HEMATOLOGIC HISTORY: Anemia likely multifactorial from iron deficiency and chronic kidney disease - S/p IV Monoferric  1 g on 10/02/2023  INTERVAL HISTORY: Raymond [name] 63 y.o. male following for anemia.  Patient reported that he has not felt significant improvement with IV iron and is feeling very well.  He has no complaints today.  He reported that  he forgot about the GI appointment and is in the process of rescheduling it.  Overall, he is feeling very well.   I have reviewed the past medical history, past surgical history, social history and family history with the patient   ALLERGIES:  is allergic to bactrim  [sulfamethoxazole -trimethoprim ], doxycycline , flagyl  [metronidazole ], and vancomycin .  MEDICATIONS:  Current Outpatient Medications  Medication Sig Dispense Refill   albuterol  (VENTOLIN  HFA) 108 (90 Base) MCG/ACT inhaler Inhale 2 puffs into the lungs every 6 (six) hours as needed for wheezing or shortness of breath.     atorvastatin  (LIPITOR) 10 MG tablet Take 1 tablet by mouth once daily 30 tablet 0   Azathioprine  75 MG TABS Take 150 mg by  mouth.     blood glucose meter kit and supplies KIT Dispense based on patient and insurance preference. Use up to four times daily as directed. (FOR ICD-9 250.00, 250.01). 1 each 0   Blood Glucose Monitoring Suppl (ACCU-CHEK GUIDE ME) w/Device KIT 1 Piece by Does not apply route as directed. 1 kit 0   calcitRIOL (ROCALTROL) 0.25 MCG capsule Take 0.25 mcg by mouth.     cloNIDine  (CATAPRES  - DOSED IN MG/24 HR) 0.3 mg/24hr patch Place 1 patch (0.3 mg total) onto the skin once a week. (Patient taking differently: Place 0.3 mg onto the skin once a week. Place patch on Sunday) 4 patch 12   clopidogrel  (PLAVIX ) 75 MG tablet Take 75 mg by mouth daily.     clotrimazole (MYCELEX) 10 MG troche Take 10 mg by mouth 5 (five) times daily.     Continuous Blood Gluc Receiver (DEXCOM G7 RECEIVER) DEVI Use to check blood sugar 3 times a day. Dx code e11.65 1 each 1   Continuous Glucose Sensor (DEXCOM G7 SENSOR) MISC USE TO CHECK GLUCOSE THREE TIMES DAILY AS DIRECTED, CHANGE SENSOR EVERY 10 DAYS. 9 each 3   doxazosin  (CARDURA ) 4 MG tablet Take 4 mg by mouth 2 (two) times daily.     Dulaglutide  (TRULICITY ) 3 MG/0.5ML SOAJ Inject 3 mg as directed once a week. 6 mL 4   furosemide  (LASIX ) 80 MG tablet Take 80 mg by mouth 2 (two) times daily as needed.     gabapentin  (NEURONTIN ) 300 MG capsule Take 300 mg by mouth.     glucose blood (ACCU-CHEK GUIDE) test strip Use as instructed 4 x daily. E11.65 150 each 5   hydrALAZINE  (APRESOLINE ) 25 MG tablet Take 2 tablets (50 mg total) by mouth 3 (three) times daily.     insulin  aspart (NOVOLOG  FLEXPEN) 100 UNIT/ML FlexPen Take 0-10 units with meals 3 times a day, maximum 30  units /day. 15 mL 4   latanoprost  (XALATAN ) 0.005 % ophthalmic solution Place 1 drop into both eyes at bedtime.     Multiple Vitamin (MULTIVITAMIN) capsule Take 1 capsule by mouth daily.     mycophenolate  (MYFORTIC ) 360 MG TBEC EC tablet Take 720 mg by mouth 2 (two) times daily.     NOVOLIN  N FLEXPEN 100  UNIT/ML FlexPen Inject 30 Units into the skin 2 (two) times daily before a meal. Takes 30 UNITS SUBCUTANEOUSLY IN THE MORNING with breakfast AND 30 UNITS with SUPPER.     omeprazole  (PRILOSEC) 40 MG capsule Take 40 mg by mouth every morning.     oxyCODONE -acetaminophen  (PERCOCET/ROXICET) 5-325 MG tablet Take 1 tablet by mouth every 6 (six) hours as needed for severe pain (pain score 7-10). 10 tablet 0   oxyCODONE -acetaminophen  (PERCOCET/ROXICET) 5-325 MG tablet Take 1 tablet  by mouth every 6 (six) hours as needed for severe pain (pain score 7-10). 6 tablet 0   predniSONE  (DELTASONE ) 10 MG tablet Take 5 mg by mouth 2 (two) times daily with a meal.     sevelamer (RENAGEL) 800 MG tablet Take 800 mg by mouth 3 (three) times daily with meals. Swallow tablet whole do not crush ,break,or chew.     tacrolimus  (PROGRAF ) 1 MG capsule Take 4 mg by mouth 2 (two) times daily.     Vitamin D , Ergocalciferol , (DRISDOL ) 50000 units CAPS capsule Take 50,000 Units by mouth See admin instructions. 15th of the month     No current facility-administered medications for this visit.     REVIEW OF SYSTEMS:   Constitutional: Denies fevers, chills or night sweats Eyes: Denies blurriness of vision Ears, nose, mouth, throat, and face: Denies mucositis or sore throat Respiratory: Denies cough, dyspnea or wheezes Cardiovascular: Denies palpitation, chest discomfort or lower extremity swelling Gastrointestinal:  Denies nausea, heartburn or change in bowel habits Skin: Denies abnormal skin rashes Lymphatics: Denies new lymphadenopathy or easy bruising Neurological:Denies numbness, tingling or new weaknesses Behavioral/Psych: Mood is stable, no new changes  All other systems were reviewed with the patient and are negative.  PHYSICAL EXAMINATION:   Vitals:   11/06/23 0809 11/06/23 0814  BP: (!) 189/62 (!) 194/65  Pulse: 78   Resp: 19   Temp: 97.9 F (36.6 C)   SpO2: 96%     GENERAL:alert, no distress and  comfortable SKIN: skin color, texture, turgor are normal, no rashes or significant lesions LUNGS: clear to auscultation and percussion with normal breathing effort HEART: regular rate & rhythm and no murmurs and no lower extremity edema ABDOMEN:abdomen soft, non-tender and normal bowel sounds Musculoskeletal:no cyanosis of digits and no clubbing  NEURO: alert & oriented x 3 with fluent speech  LABORATORY DATA:  I have reviewed the data as listed  Lab Results  Component Value Date   WBC 5.9 11/02/2023   NEUTROABS 4.1 11/02/2023   HGB 12.0 (L) 11/02/2023   HCT 41.2 11/02/2023   MCV 83.2 11/02/2023   PLT 213 11/02/2023       Chemistry      Component Value Date/Time   NA 142 11/02/2023 1109   NA 147 (H) 09/07/2023 1130   NA 142 03/13/2014 1049   K 3.9 11/02/2023 1109   K 4.1 03/13/2014 1049   CL 114 (H) 11/02/2023 1109   CL 101 03/13/2014 1049   CO2 17 (L) 11/02/2023 1109   CO2 30 03/13/2014 1049   BUN 96 (H) 11/02/2023 1109   BUN 69 (H) 09/07/2023 1130   BUN 45 (H) 03/13/2014 1049   CREATININE 6.34 (H) 11/02/2023 1109   CREATININE 1.30 03/26/2021 1513   GLU 362 11/29/2021 0000      Component Value Date/Time   CALCIUM  9.0 11/02/2023 1109   CALCIUM  8.2 (L) 03/13/2014 1049   ALKPHOS 115 11/02/2023 1109   AST 10 (L) 11/02/2023 1109   ALT 18 11/02/2023 1109   BILITOT 0.3 11/02/2023 1109   BILITOT 0.4 02/28/2021 1006      Latest Reference Range & Units 11/02/23 11:09  Iron 45 - 182 ug/dL 44 (L)  UIBC ug/dL 161  TIBC 096 - 045 ug/dL 409 (L)  Saturation Ratios 17.9 - 39.5 % 20  Ferritin 24 - 336 ng/mL 538 (H)  Folate >5.9 ng/mL >40.0  Vitamin B12 180 - 914 pg/mL 551  (L): Data is abnormally low (H): Data is abnormally  high    Latest Reference Range & Units 09/04/23 11:43  Total Protein ELP 6.0 - 8.5 g/dL 7.4 (C)  Albumin  SerPl Elph-Mcnc 2.9 - 4.4 g/dL 3.5 (C)  Albumin /Glob SerPl 0.7 - 1.7  0.9 (C)  Alpha2 Glob SerPl Elph-Mcnc 0.4 - 1.0 g/dL 0.9 (C)  Alpha 1  0.0 - 0.4 g/dL 0.3 (C)  Gamma Glob SerPl Elph-Mcnc 0.4 - 1.8 g/dL 1.6 (C)  M Protein SerPl Elph-Mcnc Not Observed g/dL Not Observed (C)  IFE 1  Comment ! (C)  Globulin, Total 2.2 - 3.9 g/dL 3.9 (C)  B-Globulin SerPl Elph-Mcnc 0.7 - 1.3 g/dL 1.1 (C)  IgG (Immunoglobin G), Serum 603 - 1,613 mg/dL 4,098 (H)  IgM (Immunoglobulin M), Srm 20 - 172 mg/dL 32  IgA 61 - 119 mg/dL 147  !: Data is abnormal (H): Data is abnormally high (C): Corrected  IFE 1 Comment Abnormal  VC      Comment: Polyclonal increase detected in one or more immunoglobulins.     Latest Reference Range & Units 09/04/23 11:43  Kappa free light chain 3.3 - 19.4 mg/L 138.2 (H)  Lambda free light chains 5.7 - 26.3 mg/L 72.1 (H)  Kappa, lambda light chain ratio 0.26 - 1.65  1.92 (H)  (H): Data is abnormally high RADIOGRAPHIC STUDIES: I have personally reviewed the radiological images as listed and agreed with the findings in the report.  None new to review

## 2023-11-06 NOTE — Assessment & Plan Note (Signed)
 Patient has chronic kidney disease secondary to diabetes and hypertension S/p kidney transplant Currently not on dialysis  - Continue to follow with nephrology - If iron deficiency is fixed, patient still anemic, will consider erythropoietin shots.  TSAT goal of 25-30.

## 2023-11-06 NOTE — Assessment & Plan Note (Signed)
 Anemia is likely multifactorial at this time.  Iron deficiency anemia and anemia of chronic kidney disease Patient has chronic kidney disease and was on dialysis previously and is off of it since the past few months.  Denied erythropoietin shot use. Iron labs and anemia improved with IV iron supplementation  - Continue oral iron every other day - Please follow-up with GI for endoscopy and colonoscopy - TSAT below the goal of 25-34 chronic kidney disease but ferritin above 500.  Would not administer IV iron at this time - Please follow-up with the nephrology for worsening kidney function  Return to clinic in 2 months with labs

## 2023-11-06 NOTE — Patient Instructions (Signed)
 VISIT SUMMARY:  You came in today for a follow-up on your anemia. Your hemoglobin levels have improved from 9.9 to 12.0 after taking iron supplements. However, you missed your previous appointment with the gastroenterologist for endoscopy and colonoscopy, which needs to be rescheduled. We also discussed your chronic kidney disease and the complications following your kidney transplant.  YOUR PLAN:  -CHRONIC KIDNEY DISEASE, STAGE 5: Stage 5 chronic kidney disease means your kidneys are functioning very poorly. We need to schedule an appointment with your nephrologist, Dr. Larinda Plover in [place], to discuss the potential need for dialysis.  -ANEMIA, UNSPECIFIED: Anemia means you have a lower than normal number of red blood cells. Your anemia has improved with iron supplements, but we need to rule out any gastrointestinal causes. Please reschedule your appointment with the gastroenterologist for an endoscopy and colonoscopy.  INSTRUCTIONS:  Please schedule an appointment with nephrologist Dr. Larinda Plover  in [place] to discuss your kidney function and potential need for dialysis. Also, reschedule your missed appointment with the gastroenterologist for endoscopy and colonoscopy to investigate any gastrointestinal causes of your anemia.

## 2023-12-25 ENCOUNTER — Other Ambulatory Visit (INDEPENDENT_AMBULATORY_CARE_PROVIDER_SITE_OTHER): Payer: Self-pay

## 2023-12-25 ENCOUNTER — Encounter: Payer: Self-pay | Admitting: Orthopedic Surgery

## 2023-12-25 ENCOUNTER — Ambulatory Visit (INDEPENDENT_AMBULATORY_CARE_PROVIDER_SITE_OTHER): Admitting: Orthopedic Surgery

## 2023-12-25 DIAGNOSIS — G8929 Other chronic pain: Secondary | ICD-10-CM

## 2023-12-25 DIAGNOSIS — M25562 Pain in left knee: Secondary | ICD-10-CM

## 2023-12-25 MED ORDER — METHYLPREDNISOLONE ACETATE 40 MG/ML IJ SUSP
40.0000 mg | Freq: Once | INTRAMUSCULAR | Status: AC
  Administered 2023-12-25: 40 mg via INTRA_ARTICULAR

## 2023-12-25 NOTE — Progress Notes (Signed)
 Orthopaedic Clinic Return  Assessment: Raymond [name] is a 63 y.o. male with the following: Left knee pain  Plan: Raymond [name] has pain in the left knee.  It has been ongoing for the past couple of weeks.  He notes a catching sensation.  Radiographs demonstrates mild to moderate degenerative changes.  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  I recommended we proceed with a steroid injection.  This was completed in clinic today.  Depending on the efficacy, we may have to discuss obtaining an MRI, especially if the mechanical symptoms persist.  Procedure note injection Left knee joint   Verbal consent was obtained to inject the left knee joint  Timeout was completed to confirm the site of injection.  The skin was prepped with alcohol and ethyl chloride was sprayed at the injection site.  A 21-gauge needle was used to inject 40 mg of Depo-Medrol and 1% lidocaine  (4 cc) into the left knee using an anterolateral approach.  There were no complications. A sterile bandage was applied.      Follow-up: Return if symptoms worsen or fail to improve.   Subjective:  Chief Complaint  Patient presents with   Knee Pain    Left knee pain- no fall, no injury been hurting for 2 weeks. Took tylenol  and ointments not helping    History of Present Illness: Raymond [name] is a 63 y.o. male who returns to clinic for evaluation of left knee pain.  I have taken care of his right knee previously.  He had a septic arthritis.  Over the past couple weeks, he has noticed worsening pain in the left knee.  He notes a catching sensation.  No specific injury.  No prior injuries to his left knee.  He does have a prosthesis on the right leg, but when he attempts to ambulate without the prosthesis, he notes that the left knee has been bothering him more.  He has not noticed any swelling.  No redness.  Aside from the catching, he  does not have restricted range of motion.  Review of Systems: No fevers or chills No numbness or tingling No chest pain No shortness of breath No bowel or bladder dysfunction No GI distress No headaches   Objective: There were no vitals taken for this visit.  Physical Exam:  Alert and oriented.  No acute distress.  Ambulates with a slight limp, primarily due to the prosthesis on the right leg.  Left knee without swelling.  No redness.  No crepitus is appreciated.  No increased laxity varus or valgus stress.  Mild tenderness to palpation along the medial joint line.  IMAGING: I personally ordered and reviewed the following images:   X-rays of the left knee were obtained in clinic today.  No acute injuries are noted.  Calcification within the vasculature.  Mild loss of joint space within the medial compartment.  There is a loss of joint space within the lateral facet of the patellofemoral compartment.  There is a well-corticated ossicle over the lateral portion of the patella.  Impression: Left knee x-rays with mild to moderate degenerative changes.   Oneil DELENA Horde, MD 12/25/2023 11:07 AM

## 2023-12-25 NOTE — Patient Instructions (Signed)

## 2024-01-04 ENCOUNTER — Ambulatory Visit: Admitting: Endocrinology

## 2024-01-08 ENCOUNTER — Inpatient Hospital Stay

## 2024-01-11 ENCOUNTER — Inpatient Hospital Stay: Attending: Oncology

## 2024-01-12 ENCOUNTER — Encounter: Admitting: Nurse Practitioner

## 2024-01-15 ENCOUNTER — Inpatient Hospital Stay: Admitting: Oncology

## 2024-01-21 ENCOUNTER — Ambulatory Visit (INDEPENDENT_AMBULATORY_CARE_PROVIDER_SITE_OTHER): Payer: Self-pay | Admitting: Internal Medicine

## 2024-01-21 ENCOUNTER — Encounter: Admitting: Family Medicine

## 2024-01-21 ENCOUNTER — Encounter: Payer: Self-pay | Admitting: Internal Medicine

## 2024-01-21 VITALS — BP 110/60 | Temp 98.6°F | Ht 67.0 in | Wt 209.4 lb

## 2024-01-21 DIAGNOSIS — E782 Mixed hyperlipidemia: Secondary | ICD-10-CM

## 2024-01-21 DIAGNOSIS — Z89511 Acquired absence of right leg below knee: Secondary | ICD-10-CM

## 2024-01-21 DIAGNOSIS — E6609 Other obesity due to excess calories: Secondary | ICD-10-CM

## 2024-01-21 DIAGNOSIS — N186 End stage renal disease: Secondary | ICD-10-CM

## 2024-01-21 DIAGNOSIS — G8929 Other chronic pain: Secondary | ICD-10-CM

## 2024-01-21 DIAGNOSIS — E1122 Type 2 diabetes mellitus with diabetic chronic kidney disease: Secondary | ICD-10-CM | POA: Diagnosis not present

## 2024-01-21 DIAGNOSIS — Z125 Encounter for screening for malignant neoplasm of prostate: Secondary | ICD-10-CM

## 2024-01-21 DIAGNOSIS — E66811 Obesity, class 1: Secondary | ICD-10-CM

## 2024-01-21 DIAGNOSIS — E113519 Type 2 diabetes mellitus with proliferative diabetic retinopathy with macular edema, unspecified eye: Secondary | ICD-10-CM

## 2024-01-21 DIAGNOSIS — I120 Hypertensive chronic kidney disease with stage 5 chronic kidney disease or end stage renal disease: Secondary | ICD-10-CM

## 2024-01-21 DIAGNOSIS — N1832 Chronic kidney disease, stage 3b: Secondary | ICD-10-CM

## 2024-01-21 DIAGNOSIS — I129 Hypertensive chronic kidney disease with stage 1 through stage 4 chronic kidney disease, or unspecified chronic kidney disease: Secondary | ICD-10-CM

## 2024-01-21 DIAGNOSIS — M79642 Pain in left hand: Secondary | ICD-10-CM

## 2024-01-21 DIAGNOSIS — Z0000 Encounter for general adult medical examination without abnormal findings: Secondary | ICD-10-CM

## 2024-01-21 DIAGNOSIS — M79641 Pain in right hand: Secondary | ICD-10-CM

## 2024-01-21 DIAGNOSIS — Z6832 Body mass index (BMI) 32.0-32.9, adult: Secondary | ICD-10-CM

## 2024-01-21 DIAGNOSIS — H9193 Unspecified hearing loss, bilateral: Secondary | ICD-10-CM

## 2024-01-21 DIAGNOSIS — R3911 Hesitancy of micturition: Secondary | ICD-10-CM

## 2024-01-21 NOTE — Patient Instructions (Addendum)
 [name] , Thank you for taking time to come for your Medicare Wellness Visit. I appreciate your ongoing commitment to your health goals. Please review the following plan we discussed and let me know if I can assist you in the future.   These are the goals we discussed:  Goals       Exercise 150 min/wk Moderate Activity      Get a Kidney (pt-stated)      He is still currently on wait list.         This is a list of the screening recommended for you and due dates:  Health Maintenance  Topic Date Due   COVID-19 Vaccine (3 - Pfizer risk series) 10/28/2019   Eye exam for diabetics  06/12/2021   Hepatitis B Vaccine (2 of 3 - 19+ 3-dose series) 06/13/2023   Complete foot exam   06/19/2023   Flu Shot  12/25/2023   Pneumococcal Vaccine for age over 10 (2 of 2 - PCV) 01/20/2025*   Hemoglobin A1C  03/08/2024   Colon Cancer Screening  12/04/2024   Medicare Annual Wellness Visit  01/20/2025   DTaP/Tdap/Td vaccine (2 - Td or Tdap) 03/15/2031   Hepatitis C Screening  Completed   HIV Screening  Completed   Zoster (Shingles) Vaccine  Completed   HPV Vaccine  Aged Out   Meningitis B Vaccine  Aged Out  *Topic was postponed. The date shown is not the original due date.   Health Maintenance, Male Adopting a healthy lifestyle and getting preventive care are important in promoting health and wellness. Ask your health care provider about: The right schedule for you to have regular tests and exams. Things you can do on your own to prevent diseases and keep yourself healthy. What should I know about diet, weight, and exercise? Eat a healthy diet  Eat a diet that includes plenty of vegetables, fruits, low-fat dairy products, and lean protein. Do not eat a lot of foods that are high in solid fats, added sugars, or sodium. Maintain a healthy weight Body mass index (BMI) is a measurement that can be used to identify possible weight problems. It estimates body fat based on height and weight. Your  health care provider can help determine your BMI and help you achieve or maintain a healthy weight. Get regular exercise Get regular exercise. This is one of the most important things you can do for your health. Most adults should: Exercise for at least 150 minutes each week. The exercise should increase your heart rate and make you sweat (moderate-intensity exercise). Do strengthening exercises at least twice a week. This is in addition to the moderate-intensity exercise. Spend less time sitting. Even light physical activity can be beneficial. Watch cholesterol and blood lipids Have your blood tested for lipids and cholesterol at 63 years of age, then have this test every 5 years. You may need to have your cholesterol levels checked more often if: Your lipid or cholesterol levels are high. You are older than 63 years of age. You are at high risk for heart disease. What should I know about cancer screening? Many types of cancers can be detected early and may often be prevented. Depending on your health history and family history, you may need to have cancer screening at various ages. This may include screening for: Colorectal cancer. Prostate cancer. Skin cancer. Lung cancer. What should I know about heart disease, diabetes, and high blood pressure? Blood pressure and heart disease High blood pressure causes heart  disease and increases the risk of stroke. This is more likely to develop in people who have high blood pressure readings or are overweight. Talk with your health care provider about your target blood pressure readings. Have your blood pressure checked: Every 3-5 years if you are 89-31 years of age. Every year if you are 24 years old or older. If you are between the ages of 28 and 3 and are a current or former smoker, ask your health care provider if you should have a one-time screening for abdominal aortic aneurysm (AAA). Diabetes Have regular diabetes screenings. This checks  your fasting blood sugar level. Have the screening done: Once every three years after age 71 if you are at a normal weight and have a low risk for diabetes. More often and at a younger age if you are overweight or have a high risk for diabetes. What should I know about preventing infection? Hepatitis B If you have a higher risk for hepatitis B, you should be screened for this virus. Talk with your health care provider to find out if you are at risk for hepatitis B infection. Hepatitis C Blood testing is recommended for: Everyone born from [birth year] through 1965. Anyone with known risk factors for hepatitis C. Sexually transmitted infections (STIs) You should be screened each year for STIs, including gonorrhea and chlamydia, if: You are sexually active and are younger than 63 years of age. You are older than 63 years of age and your health care provider tells you that you are at risk for this type of infection. Your sexual activity has changed since you were last screened, and you are at increased risk for chlamydia or gonorrhea. Ask your health care provider if you are at risk. Ask your health care provider about whether you are at high risk for HIV. Your health care provider may recommend a prescription medicine to help prevent HIV infection. If you choose to take medicine to prevent HIV, you should first get tested for HIV. You should then be tested every 3 months for as long as you are taking the medicine. Follow these instructions at home: Alcohol use Do not drink alcohol if your health care provider tells you not to drink. If you drink alcohol: Limit how much you have to 0-2 drinks a day. Know how much alcohol is in your drink. In the [place] one drink equals one 12 oz bottle of beer (355 mL), one 5 oz glass of wine (148 mL), or one 1 oz glass of hard liquor (44 mL). Lifestyle Do not use any products that contain nicotine or tobacco. These products include cigarettes, chewing tobacco, and  vaping devices, such as e-cigarettes. If you need help quitting, ask your health care provider. Do not use street drugs. Do not share needles. Ask your health care provider for help if you need support or information about quitting drugs. General instructions Schedule regular health, dental, and eye exams. Stay current with your vaccines. Tell your health care provider if: You often feel depressed. You have ever been abused or do not feel safe at home. Summary Adopting a healthy lifestyle and getting preventive care are important in promoting health and wellness. Follow your health care provider's instructions about healthy diet, exercising, and getting tested or screened for diseases. Follow your health care provider's instructions on monitoring your cholesterol and blood pressure. This information is not intended to replace advice given to you by your health care provider. Make sure you discuss any questions you have  with your health care provider. Document Revised: 10/01/2020 Document Reviewed: 10/01/2020 Elsevier Patient Education  2024 [employer].

## 2024-01-21 NOTE — Progress Notes (Signed)
 Subjective:    Raymond [name] is a 63 y.o. male who presents for a Welcome to Medicare exam.   Cardiac Risk Factors include: advanced age (>89men, >64 women);dyslipidemia;male gender;obesity (BMI >30kg/m2);sedentary lifestyle  Discussed the use of AI scribe software for clinical note transcription with the patient, who gave verbal consent to proceed.  History of Present Illness Raymond [name] is a 63 year old male with diabetes, hypertension, and chronic kidney disease on dialysis who presents for a Welcome to Medicare visit. He is accompanied by his wife.  He has been experiencing significant blood pressure drops during dialysis sessions, with readings as low as 84/46. In response, he independently stopped taking some of his blood pressure medications, including hydralazine  25 mg three times a day, but continues to use the clonidine  patch. He also takes Lasix  but holds it on dialysis days. These changes have not been communicated to his cardiologist or nephrologist.  He has a history of diabetes with blood sugar levels typically ranging from 140 to 180 mg/dL. He has diabetic retinopathy and has received laser treatments for this condition. He experiences tingling in his hands.  He has chronic knee pain, particularly in one knee, which has been previously evaluated by an orthopedic specialist. He received a lidocaine  injection for arthritis, which provided no relief. He also reports new onset hand pain for the past three days.  He has a history of high cholesterol and is currently taking atorvastatin  10 mg. No smoking and reports a good appetite.  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 He is on azathioprine  and Plavix .  He reports a history of falls both inside and outside the home, and he uses a wheelchair indoors due to being a below-the-knee amputee. He does not use a cane or walker but  has grab bars and a shower chair at home. No feelings of depression or hopelessness.  He has a subconjunctival hemorrhage in one eye, which occurred last night, but he is unsure if it has affected his vision. He has a history of eye exams at [facility].     Objective:    Today's Vitals   01/21/24 1039  BP: 110/60  Temp: 98.6 F (37 C)  SpO2: 98%  Weight: 209 lb 6.4 oz (95 kg)  Height: 5' 7 (1.702 m)  PainSc: 6    Body mass index is 32.8 kg/m.  Medications Outpatient Encounter Medications as of 01/21/2024  Medication Sig   albuterol  (VENTOLIN  HFA) 108 (90 Base) MCG/ACT inhaler Inhale 2 puffs into the lungs every 6 (six) hours as needed for wheezing or shortness of breath.   atorvastatin  (LIPITOR) 10 MG tablet Take 1 tablet by mouth once daily   Azathioprine  75 MG TABS Take 150 mg by mouth.   blood glucose meter kit and supplies KIT Dispense based on patient and insurance preference. Use up to four times daily as directed. (FOR ICD-9 250.00, 250.01).   Blood Glucose Monitoring Suppl (ACCU-CHEK GUIDE ME) w/Device KIT 1 Piece by Does not apply route as directed.   calcitRIOL (ROCALTROL) 0.25 MCG capsule Take 0.25 mcg by mouth.   cloNIDine  (CATAPRES  - DOSED IN MG/24 HR) 0.3 mg/24hr patch Place 1 patch (0.3 mg total) onto the skin once a week. (Patient taking differently: Place 0.3 mg onto the skin once a week. Place patch on Sunday)   clopidogrel  (PLAVIX ) 75 MG tablet Take 75 mg by mouth daily.  clotrimazole (MYCELEX) 10 MG troche Take 10 mg by mouth 5 (five) times daily.   Continuous Blood Gluc Receiver (DEXCOM G7 RECEIVER) DEVI Use to check blood sugar 3 times a day. Dx code e11.65   Continuous Glucose Sensor (DEXCOM G7 SENSOR) MISC USE TO CHECK GLUCOSE THREE TIMES DAILY AS DIRECTED, CHANGE SENSOR EVERY 10 DAYS.   doxazosin  (CARDURA ) 4 MG tablet Take 4 mg by mouth 2 (two) times daily.   Dulaglutide  (TRULICITY ) 3 MG/0.5ML SOAJ Inject 3 mg as directed once a week.    furosemide  (LASIX ) 80 MG tablet Take 80 mg by mouth 2 (two) times daily as needed.   gabapentin  (NEURONTIN ) 300 MG capsule Take 300 mg by mouth.   glucose blood (ACCU-CHEK GUIDE) test strip Use as instructed 4 x daily. E11.65   hydrALAZINE  (APRESOLINE ) 25 MG tablet Take 2 tablets (50 mg total) by mouth 3 (three) times daily.   insulin  aspart (NOVOLOG  FLEXPEN) 100 UNIT/ML FlexPen Take 0-10 units with meals 3 times a day, maximum 30  units /day.   latanoprost  (XALATAN ) 0.005 % ophthalmic solution Place 1 drop into both eyes at bedtime.   Multiple Vitamin (MULTIVITAMIN) capsule Take 1 capsule by mouth daily.   mycophenolate  (MYFORTIC ) 360 MG TBEC EC tablet Take 720 mg by mouth 2 (two) times daily.   NOVOLIN  N FLEXPEN 100 UNIT/ML FlexPen Inject 30 Units into the skin 2 (two) times daily before a meal. Takes 30 UNITS SUBCUTANEOUSLY IN THE MORNING with breakfast AND 30 UNITS with SUPPER.   omeprazole  (PRILOSEC) 40 MG capsule Take 40 mg by mouth every morning.   oxyCODONE -acetaminophen  (PERCOCET/ROXICET) 5-325 MG tablet Take 1 tablet by mouth every 6 (six) hours as needed for severe pain (pain score 7-10).   oxyCODONE -acetaminophen  (PERCOCET/ROXICET) 5-325 MG tablet Take 1 tablet by mouth every 6 (six) hours as needed for severe pain (pain score 7-10).   predniSONE  (DELTASONE ) 10 MG tablet Take 5 mg by mouth 2 (two) times daily with a meal.   sevelamer (RENAGEL) 800 MG tablet Take 800 mg by mouth 3 (three) times daily with meals. Swallow tablet whole do not crush ,break,or chew.   tacrolimus  (PROGRAF ) 1 MG capsule Take 4 mg by mouth 2 (two) times daily.   Vitamin D , Ergocalciferol , (DRISDOL ) 50000 units CAPS capsule Take 50,000 Units by mouth See admin instructions. 15th of the month   No facility-administered encounter medications on file as of 01/21/2024.     History: Past Medical History:  Diagnosis Date   Diabetes (HCC)    Diabetes mellitus without complication (HCC)    type 2   ESRD (end stage  renal disease) (HCC)    03/09/2015- patient had a kidney transplant   Hypertension    Nausea vomiting and diarrhea 12/10/2015   Peripheral vascular disease (HCC)    Renal disorder    Renal insufficiency    Past Surgical History:  Procedure Laterality Date   ABDOMINAL AORTOGRAM W/LOWER EXTREMITY N/A 09/11/2021   Procedure: ABDOMINAL AORTOGRAM W/LOWER EXTREMITY;  Surgeon: Darron Deatrice LABOR, MD;  Location: [facility];  Service: Cardiovascular;  Laterality: N/A;   AMPUTATION Right 09/25/2021   Procedure: RIGHT TRANSMETATARSAL AMPUTATION AND APPLY TISSUE GRAFT;  Surgeon: Harden Jerona GAILS, MD;  Location: [facility] OR;  Service: Orthopedics;  Laterality: Right;   AMPUTATION Right 11/16/2021   Procedure: RIGHT AMPUTATION BELOW KNEE WITH WOUND VAC APPLICATION;  Surgeon: Harden Jerona GAILS, MD;  Location: [facility] OR;  Service: Orthopedics;  Laterality: Right;   BONE BIOPSY Right 03/06/2021  Procedure: BONE BIOPSY;  Surgeon: Gershon Donnice SAUNDERS, DPM;  Location: [facility] ORS;  Service: Podiatry;  Laterality: Right;   CENTRAL VENOUS CATHETER INSERTION Right 01/20/2020   Procedure: INSERTION CENTRAL LINE ADULT; Tunneled central line;  Surgeon: Kallie Manuelita BROCKS, MD;  Location: [facility];  Service: General;  Laterality: Right;   COLONOSCOPY N/A 12/05/2014   DOQ:fnizmjuz external and internal hemorrhoid/mild diverticulosis/11 polyps removed   ESOPHAGOGASTRODUODENOSCOPY N/A 12/05/2014   DOQ:fpoi duodenitis   GRAFT APPLICATION Right 03/06/2021   Procedure: GRAFT APPLICATION;  Surgeon: Gershon Donnice SAUNDERS, DPM;  Location: [facility];  Service: Podiatry;  Laterality: Right;   INCISION AND DRAINAGE OF WOUND Right 08/15/2021   Procedure: IRRIGATION AND DEBRIDEMENT WOUND;  Surgeon: Burt Fus, DPM;  Location: [facility];  Service: Podiatry;  Laterality: Right;  need to make card not  a irrigation and debridement card   IR FLUORO GUIDE CV LINE RIGHT  03/01/2019   IR PERC TUN PERIT CATH WO PORT S&I /IMAG  06/27/2021   IR REMOVAL TUN CV  CATH W/O FL  05/10/2019   IR REMOVAL TUN CV CATH W/O FL  02/29/2020   IR REMOVAL TUN CV CATH W/O FL  07/19/2021   IR US  GUIDE VASC ACCESS RIGHT  03/01/2019   IR US  GUIDE VASC ACCESS RIGHT  06/27/2021   IRRIGATION AND DEBRIDEMENT ELBOW Left 06/24/2021   Procedure: IRRIGATION AND DEBRIDEMENT ELBOW;  Surgeon: Leora Lynwood SAUNDERS, MD;  Location: [facility] ORS;  Service: Orthopedics;  Laterality: Left;   KIDNEY TRANSPLANT  03/27/2015   KNEE ARTHROTOMY Right 06/08/2023   Procedure: KNEE ARTHROTOMY;  Surgeon: Onesimo Oneil LABOR, MD;  Location: [facility];  Service: Orthopedics;  Laterality: Right;   Penile Pump Insertion     PERIPHERAL VASCULAR ATHERECTOMY  09/11/2021   Procedure: PERIPHERAL VASCULAR ATHERECTOMY;  Surgeon: Darron Deatrice LABOR, MD;  Location: [facility];  Service: Cardiovascular;;  Shockwave Lithotripsy   TEE WITHOUT CARDIOVERSION N/A 01/17/2020   Procedure: TRANSESOPHAGEAL ECHOCARDIOGRAM (TEE) WITH PROPOFOL ;  Surgeon: Alvan Dorn FALCON, MD;  Location: [facility];  Service: Endoscopy;  Laterality: N/A;   WOUND DEBRIDEMENT Right 03/06/2021   Procedure: DEBRIDEMENT WOUND;  Surgeon: Gershon Donnice SAUNDERS, DPM;  Location: [facility];  Service: Podiatry;  Laterality: Right;    Family History  Problem Relation Age of Onset   Diabetes Mother    Heart disease Father    Colon cancer Neg Hx    Social History   Occupational History   Not on file  Tobacco Use   Smoking status: Never   Smokeless tobacco: Never  Vaping Use   Vaping status: Never Used  Substance and Sexual Activity   Alcohol use: No    Alcohol/week: 0.0 standard drinks of alcohol   Drug use: No   Sexual activity: Yes    Tobacco Counseling Counseling given: Yes   Immunizations and Health Maintenance Immunization History  Administered Date(s) Administered   Hepatitis B, ADULT 05/16/2023   Influenza, Seasonal, Injecte, Preservative Fre 03/12/2023   Influenza,inj,Quad PF,6+ Mos 02/22/2019, 03/14/2021, 02/05/2022    Influenza-Unspecified 01/29/2012, 02/01/2013, 03/05/2015, 03/09/2016, 02/24/2023   PFIZER(Purple Top)SARS-COV-2 Vaccination 09/02/2019, 09/30/2019   PPD Test 12/30/2012, 05/12/2023   Pneumococcal Polysaccharide-23 03/10/2020   Pneumococcal-Unspecified 08/31/2010   Tdap 03/14/2021   Zoster Recombinant(Shingrix) 03/10/2020, 10/16/2022   Health Maintenance Due  Topic Date Due   COVID-19 Vaccine (3 - Pfizer risk series) 10/28/2019   OPHTHALMOLOGY EXAM  06/12/2021   Hepatitis B Vaccines 19-59 Average Risk (2 of 3 - 19+ 3-dose  series) 06/13/2023   FOOT EXAM  06/19/2023   Influenza Vaccine  12/25/2023    Activities of Daily Living    01/21/2024   10:42 AM 01/21/2024   10:41 AM  In your present state of health, do you have any difficulty performing the following activities:  Hearing?  0  Vision?  0  Difficulty concentrating or making decisions? 0 0  Walking or climbing stairs? 0   Dressing or bathing? 0   Doing errands, shopping? 0   Preparing Food and eating ? N   Using the Toilet? N   In the past six months, have you accidently leaked urine? N   Do you have problems with loss of bowel control? N   Managing your Medications? N   Managing your Finances? N   Housekeeping or managing your Housekeeping? N     Physical Exam  Physical Exam Vitals and nursing note reviewed.  Constitutional:      Appearance: Normal appearance.     Comments: Seated in chair  HENT:     Head: Normocephalic and atraumatic.  Eyes:     Extraocular Movements: Extraocular movements intact.     Pupils: Pupils are equal, round, and reactive to light.     Comments: Left subconjunctival hemorrhage  Cardiovascular:     Rate and Rhythm: Normal rate and regular rhythm.     Pulses: Normal pulses.     Heart sounds: Normal heart sounds.  Pulmonary:     Effort: Pulmonary effort is normal.     Breath sounds: Normal breath sounds.  Abdominal:     General: Bowel sounds are normal.     Palpations: Abdomen is soft.   Genitourinary:    Comments: Deferred  Musculoskeletal:     Cervical back: Normal range of motion.     Comments: R BKA  Skin:    General: Skin is warm and dry.  Neurological:     General: No focal deficit present.     Mental Status: He is alert and oriented to person, place, and time.  Psychiatric:        Mood and Affect: Mood normal.     Advanced Directives: Does Patient Have a Medical Advance Directive?: Yes, No Does patient want to make changes to medical advance directive?: Yes (MAU/Ambulatory/Procedural Areas - Information given) Would patient like information on creating a medical advance directive?: Yes (MAU/Ambulatory/Procedural Areas - Information given)   EKG:  normal EKG, normal sinus rhythm, unchanged from previous tracings, normal sinus rhythm     Assessment:    This is a routine wellness  examination for this patient .  Vision/Hearing screen Hearing Screening   500Hz  1000Hz  2000Hz  4000Hz   Right ear Fail Fail Fail Fail  Left ear Fail Fail Fail Fail   Vision Screening   Right eye Left eye Both eyes  Without correction 20/200 20/70 20/70  With correction        Goals       Exercise 150 min/wk Moderate Activity      Get a Kidney (pt-stated)      He is still currently on wait list.          Depression Screen    01/21/2024   11:20 AM 09/03/2023   10:15 AM 10/16/2022    2:10 PM 06/18/2022    9:12 AM  PHQ 2/9 Scores  PHQ - 2 Score 0 0 0 0     Fall Risk    01/21/2024   10:47 AM  Fall Risk  Falls in the past year? 1  Number falls in past yr: 0  Injury with Fall? 1  Risk for fall due to : History of fall(s)  Follow up Falls evaluation completed    Cognitive Function        01/21/2024   10:47 AM  6CIT Screen  What Year? 0 points  What month? 0 points  What time? 0 points  Count back from 20 4 points  Months in reverse 0 points  Repeat phrase 8 points  Total Score 12 points    Patient Care Team: Georgina Speaks, FNP as PCP - General  (General Practice) Court Dorn PARAS, MD as PCP - Cardiology (Cardiology) Harvey Margo CROME, MD (Inactive) as Consulting Physician (Gastroenterology) Harvey Margo CROME, MD (Inactive) as Consulting Physician (Gastroenterology) Luiz Channel, MD as Consulting Physician (Infectious Diseases) Gershon Donnice SAUNDERS, DPM as Consulting Physician (Podiatry)     Plan:   Encounter for Medicare annual wellness exam Assessment & Plan: The annual wellness visit was performed including discussion of advanced directives, assessment of functional status and cognitive function. EKG performed, NSR w/o acute changes. A limited exam was also performed. He will rto in one year for AWV with [facility] Advisor.  DRE deferred.  He is advised to get 30-45 minutes of regular exercise, no less than four to five days per week. Both weight-bearing and aerobic exercises are recommended.  He is advised to follow a healthy diet with at least six fruits/veggies per day, decrease intake of red meat and other saturated fats and to increase fish intake to twice weekly.  Meats/fish should not be fried -- baked, boiled or broiled is preferable. It is also important to cut back on your sugar intake.  Be sure to read labels - try to avoid anything with added sugar, high fructose corn syrup or other sweeteners.  If you must use a sweetener, you can try stevia or monkfruit.  It is also important to avoid artificially sweetened foods/beverages and diet drinks. Lastly, wear SPF 50 sunscreen on exposed skin and when in direct sunlight for an extended period of time.  Be sure to avoid fast food restaurants and aim for at least 60 ounces of water daily.         DM type 2 causing ESRD (HCC) Assessment & Plan: Type 2 diabetes with proliferative diabetic retinopathy and history of laser treatments. Blood sugars between 140-180 mg/dL, goal <879 mg/dL. - Order A1c test. - Encourage blood sugar management to achieve target levels.  On dialysis for end-stage  renal disease. 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 Blood pressure management is a concern during dialysis. - Discuss blood pressure management with nephrologist. - Continue dialysis sessions as scheduled.  Orders: -     EKG 12-Lead -     Hemoglobin A1c  Proliferative diabetic retinopathy with macular edema associated with type 2 diabetes mellitus, unspecified laterality (HCC) Assessment & Plan: Chronic, continue with regular visits with ophthalmology.    Benign hypertension with chronic kidney disease Assessment & Plan: Chronic.  He is currently experiencing hypotensive episodes during dialysis. On clonidine  patch and hydralazine . Discussed potential need to adjust hydralazine  dosage with nephrologist. Advised to consider holding antihypertensives before dialysis. - Discuss adjusting hydralazine  dosage with nephrologist. - Consider holding antihypertensives before dialysis sessions.   Mixed hyperlipidemia Assessment & Plan: Chronic, LDL goal is less than 70.  On atorvastatin  10 mg for hyperlipidemia management.   Bilateral hand pain Assessment & Plan: I will check arthritis panel. Advised  to follow anti-inflammatory diet.  - Check autoimmune arthritis panel.  Orders: -     ANA, IFA (with reflex) -     CYCLIC CITRUL PEPTIDE ANTIBODY, IGG/IGA -     Rheumatoid factor -     Sedimentation rate -     Uric acid  Bilateral hearing loss, unspecified hearing loss type Assessment & Plan: Failed hearing screening during visit. Medicare covers formal hearing exam. - Schedule formal hearing exam.  Orders: -     Ambulatory referral to Audiology  Urinary hesitancy -     PSA  Class 1 obesity due to excess calories with serious comorbidity and body mass index (BMI) of 32.0 to 32.9 in adult Assessment & Plan: He is encouraged to strive for BMI less than 30 to decrease cardiac risk. Advised to aim for at least 150 minutes of exercise per week.    History of right  below knee amputation [facility]) Assessment & Plan: Surgery performed in 2023.    Other orders -     FANA Staining Patterns   I have personally reviewed and noted the following in the patient's chart:   Medical and social history Use of alcohol, tobacco or illicit drugs  Current medications and supplements including opioid prescriptions. Patient is not currently taking opioid prescriptions. Functional ability and status Nutritional status Physical activity Advanced directives List of other physicians Hospitalizations, surgeries, and ER visits in previous 12 months Vitals Screenings to include cognitive, depression, and falls Referrals and appointments  In addition, I have reviewed and discussed with patient certain preventive protocols, quality metrics, and best practice recommendations. A written personalized care plan for preventive services as well as general preventive health recommendations were provided to patient.     Catheryn LOISE Slocumb, MD 01/31/2024

## 2024-01-22 LAB — FANA STAINING PATTERNS: Speckled Pattern: 1:320 {titer} — ABNORMAL HIGH

## 2024-01-22 LAB — CYCLIC CITRUL PEPTIDE ANTIBODY, IGG/IGA: Cyclic Citrullin Peptide Ab: 3 U (ref 0–19)

## 2024-01-22 LAB — PSA: Prostate Specific Ag, Serum: 0.6 ng/mL (ref 0.0–4.0)

## 2024-01-22 LAB — SEDIMENTATION RATE: Sed Rate: 107 mm/h — ABNORMAL HIGH (ref 0–30)

## 2024-01-22 LAB — URIC ACID: Uric Acid: 3.4 mg/dL — ABNORMAL LOW (ref 3.8–8.4)

## 2024-01-22 LAB — HEMOGLOBIN A1C
Est. average glucose Bld gHb Est-mCnc: 186 mg/dL
Hgb A1c MFr Bld: 8.1 % — ABNORMAL HIGH (ref 4.8–5.6)

## 2024-01-22 LAB — RHEUMATOID FACTOR: Rheumatoid fact SerPl-aCnc: 14.7 [IU]/mL — ABNORMAL HIGH (ref <14.0)

## 2024-01-22 LAB — ANTINUCLEAR ANTIBODIES, IFA: ANA Titer 1: POSITIVE — AB

## 2024-01-26 ENCOUNTER — Ambulatory Visit: Payer: Self-pay | Admitting: Internal Medicine

## 2024-01-31 DIAGNOSIS — Z0000 Encounter for general adult medical examination without abnormal findings: Secondary | ICD-10-CM | POA: Insufficient documentation

## 2024-01-31 DIAGNOSIS — M79641 Pain in right hand: Secondary | ICD-10-CM | POA: Insufficient documentation

## 2024-01-31 NOTE — Assessment & Plan Note (Signed)
 Chronic.  He is currently experiencing hypotensive episodes during dialysis. On clonidine  patch and hydralazine . Discussed potential need to adjust hydralazine  dosage with nephrologist. Advised to consider holding antihypertensives before dialysis. - Discuss adjusting hydralazine  dosage with nephrologist. - Consider holding antihypertensives before dialysis sessions.

## 2024-01-31 NOTE — Assessment & Plan Note (Signed)
 Surgery performed in 2023.

## 2024-01-31 NOTE — Assessment & Plan Note (Signed)
 The annual wellness visit was performed including discussion of advanced directives, assessment of functional status and cognitive function. EKG performed, NSR w/o acute changes. A limited exam was also performed. He will rto in one year for AWV with [facility] Advisor.  DRE deferred.  He is advised to get 30-45 minutes of regular exercise, no less than four to five days per week. Both weight-bearing and aerobic exercises are recommended.  He is advised to follow a healthy diet with at least six fruits/veggies per day, decrease intake of red meat and other saturated fats and to increase fish intake to twice weekly.  Meats/fish should not be fried -- baked, boiled or broiled is preferable. It is also important to cut back on your sugar intake.  Be sure to read labels - try to avoid anything with added sugar, high fructose corn syrup or other sweeteners.  If you must use a sweetener, you can try stevia or monkfruit.  It is also important to avoid artificially sweetened foods/beverages and diet drinks. Lastly, wear SPF 50 sunscreen on exposed skin and when in direct sunlight for an extended period of time.  Be sure to avoid fast food restaurants and aim for at least 60 ounces of water daily.

## 2024-01-31 NOTE — Assessment & Plan Note (Signed)
 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 Blood sugars between 140-180 mg/dL, goal <879 mg/dL. - Order A1c test. - Encourage blood sugar management to achieve target levels.  On dialysis for end-stage renal disease. 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 Blood pressure management is a concern during dialysis. - Discuss blood pressure management with nephrologist. - Continue dialysis sessions as scheduled.

## 2024-01-31 NOTE — Assessment & Plan Note (Signed)
 Chronic, LDL goal is less than 70.  On atorvastatin  10 mg for hyperlipidemia management.

## 2024-01-31 NOTE — Assessment & Plan Note (Signed)
 Failed hearing screening during visit. Medicare covers formal hearing exam. - Schedule formal hearing exam.

## 2024-01-31 NOTE — Assessment & Plan Note (Signed)
 He is encouraged to strive for BMI less than 30 to decrease cardiac risk. Advised to aim for at least 150 minutes of exercise per week.

## 2024-01-31 NOTE — Assessment & Plan Note (Signed)
 I will check arthritis panel. Advised to follow anti-inflammatory diet.  - Check autoimmune arthritis panel.

## 2024-01-31 NOTE — Assessment & Plan Note (Signed)
 Chronic, continue with regular visits with ophthalmology.

## 2024-02-02 ENCOUNTER — Encounter: Payer: Self-pay | Admitting: Oncology

## 2024-02-02 ENCOUNTER — Ambulatory Visit (INDEPENDENT_AMBULATORY_CARE_PROVIDER_SITE_OTHER): Admitting: Endocrinology

## 2024-02-02 ENCOUNTER — Encounter: Payer: Self-pay | Admitting: Endocrinology

## 2024-02-02 DIAGNOSIS — Z794 Long term (current) use of insulin: Secondary | ICD-10-CM | POA: Diagnosis not present

## 2024-02-02 DIAGNOSIS — E1165 Type 2 diabetes mellitus with hyperglycemia: Secondary | ICD-10-CM

## 2024-02-02 MED ORDER — NOVOLOG FLEXPEN 100 UNIT/ML ~~LOC~~ SOPN: PEN_INJECTOR | SUBCUTANEOUS | 4 refills | Status: DC

## 2024-02-02 NOTE — Progress Notes (Unsigned)
 Outpatient Endocrinology Note Raymond Joash Tony, MD  02/04/24  Patient's Name: Raymond [name]    DOB: 05-Aug-1960    MRN: [id number]                                                    REASON OF VISIT: Follow up for type 2 diabetes mellitus  PCP: Georgina Speaks, FNP  HISTORY OF PRESENT ILLNESS:   Raymond [name] is a 63 y.o. old male with past medical history listed below, is here for follow up of type 2 diabetes mellitus.   Pertinent Diabetes History: Patient was diagnosed with type 2 diabetes mellitus in 2002.  Insulin  therapy was started in 2018.  He has uncontrolled diabetes mellitus hemoglobin A1c in the range of 7.1 to around 14%.  In the visit in January 27, 2023 patient was taking Novolin  and 30 units 2 times a day, insulin  regimen was adjusted with the start of NovoLog  with meals.  Chronic Diabetes Complications : Retinopathy: proliferative diabetic retinopathy with macular edema. Last ophthalmology exam was done on Due. Nephropathy: microalbuminuria present, following with nephrology. Peripheral neuropathy: yes, on gabapentin .  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 Coronary artery disease: no Stroke: no  Relevant comorbidities and cardiovascular risk factors: Obesity: yes Body mass index is 32.56 kg/m.  Hypertension: yes Hyperlipidemia. Yes, on statin  Current / Home Diabetic regimen includes: Novolin  N 30 units in the morning with breakfast and 30 units at bedtime.   NovoLog  0-10 units usually with meals 3 times a day.  Not fully compliant.  Trulicity  3.0 mg weekly.  Prior diabetic medications: Glipizide  in the past.  Glycemic data:    CONTINUOUS GLUCOSE MONITORING SYSTEM (CGMS) INTERPRETATION:                         Dexcom G7 CGM-  Sensor Download (Sensor download was reviewed and summarized below.) Dates: August 27 to September 9 , 2025, 14 days  Glucose Management Indicator: 8.3% Sensor Average: 209 SD  77     Impression: Hyperglycemia postprandially especially with lunch and supper and sometimes with breakfast.  Hyperglycemia up to 250-300.  Blood sugar trending down overnight and acceptable in the early morning and fasting.  No concerning hypoglycemia.  Hypoglycemia: Patient has minor hypoglycemic episodes. Patient has hypoglycemia awareness/  Factors modifying glucose control: 1.  Diabetic diet assessment: 3 meals a day and he reports frequent snacking.  Dinner around 8 PM.  Drinks prune juice or range and can vary juices.  2.  Staying active or exercising: None  3.  Medication compliance: compliant most of the time.  Interval history  Diabetes regimen as reviewed above.  CGM data as reviewed and noted above.  Hemoglobin A1c was found to 8.1%.  He has complaints of hand pain, advised to discuss with primary care provider.  No other complaints today.  REVIEW OF SYSTEMS As per history of present illness.   PAST MEDICAL HISTORY: Past Medical History:  Diagnosis Date   Diabetes (HCC)    Diabetes mellitus without complication (HCC)    type 2   ESRD (end stage renal disease) (HCC)    03/09/2015- patient had a kidney transplant   Hypertension    Nausea vomiting and diarrhea 12/10/2015   Peripheral vascular disease (HCC)    Renal disorder  Renal insufficiency     PAST SURGICAL HISTORY: Past Surgical History:  Procedure Laterality Date   ABDOMINAL AORTOGRAM W/LOWER EXTREMITY N/A 09/11/2021   Procedure: ABDOMINAL AORTOGRAM W/LOWER EXTREMITY;  Surgeon: Darron Deatrice LABOR, MD;  Location: [facility];  Service: Cardiovascular;  Laterality: N/A;   AMPUTATION Right 09/25/2021   Procedure: RIGHT TRANSMETATARSAL AMPUTATION AND APPLY TISSUE GRAFT;  Surgeon: Harden Jerona GAILS, MD;  Location: [facility] OR;  Service: Orthopedics;  Laterality: Right;   AMPUTATION Right 11/16/2021   Procedure: RIGHT AMPUTATION BELOW KNEE WITH WOUND VAC APPLICATION;  Surgeon: Harden Jerona GAILS, MD;  Location: [facility]  OR;  Service: Orthopedics;  Laterality: Right;   BONE BIOPSY Right 03/06/2021   Procedure: BONE BIOPSY;  Surgeon: Gershon Donnice SAUNDERS, DPM;  Location: [facility] ORS;  Service: Podiatry;  Laterality: Right;   CENTRAL VENOUS CATHETER INSERTION Right 01/20/2020   Procedure: INSERTION CENTRAL LINE ADULT; Tunneled central line;  Surgeon: Kallie Manuelita BROCKS, MD;  Location: [facility];  Service: General;  Laterality: Right;   COLONOSCOPY N/A 12/05/2014   DOQ:fnizmjuz external and internal hemorrhoid/mild diverticulosis/11 polyps removed   ESOPHAGOGASTRODUODENOSCOPY N/A 12/05/2014   DOQ:fpoi duodenitis   GRAFT APPLICATION Right 03/06/2021   Procedure: GRAFT APPLICATION;  Surgeon: Gershon Donnice SAUNDERS, DPM;  Location: [facility];  Service: Podiatry;  Laterality: Right;   INCISION AND DRAINAGE OF WOUND Right 08/15/2021   Procedure: IRRIGATION AND DEBRIDEMENT WOUND;  Surgeon: Burt Fus, DPM;  Location: [facility];  Service: Podiatry;  Laterality: Right;  need to make card not  a irrigation and debridement card   IR FLUORO GUIDE CV LINE RIGHT  03/01/2019   IR PERC TUN PERIT CATH WO PORT S&I /IMAG  06/27/2021   IR REMOVAL TUN CV CATH W/O FL  05/10/2019   IR REMOVAL TUN CV CATH W/O FL  02/29/2020   IR REMOVAL TUN CV CATH W/O FL  07/19/2021   IR US  GUIDE VASC ACCESS RIGHT  03/01/2019   IR US  GUIDE VASC ACCESS RIGHT  06/27/2021   IRRIGATION AND DEBRIDEMENT ELBOW Left 06/24/2021   Procedure: IRRIGATION AND DEBRIDEMENT ELBOW;  Surgeon: Leora Lynwood SAUNDERS, MD;  Location: [facility] ORS;  Service: Orthopedics;  Laterality: Left;   KIDNEY TRANSPLANT  03/27/2015   KNEE ARTHROTOMY Right 06/08/2023   Procedure: KNEE ARTHROTOMY;  Surgeon: Onesimo Oneil LABOR, MD;  Location: [facility];  Service: Orthopedics;  Laterality: Right;   Penile Pump Insertion     PERIPHERAL VASCULAR ATHERECTOMY  09/11/2021   Procedure: PERIPHERAL VASCULAR ATHERECTOMY;  Surgeon: Darron Deatrice LABOR, MD;  Location: [facility];  Service: Cardiovascular;;  Shockwave Lithotripsy    TEE WITHOUT CARDIOVERSION N/A 01/17/2020   Procedure: TRANSESOPHAGEAL ECHOCARDIOGRAM (TEE) WITH PROPOFOL ;  Surgeon: Alvan Dorn FALCON, MD;  Location: [facility];  Service: Endoscopy;  Laterality: N/A;   WOUND DEBRIDEMENT Right 03/06/2021   Procedure: DEBRIDEMENT WOUND;  Surgeon: Gershon Donnice SAUNDERS, DPM;  Location: [facility];  Service: Podiatry;  Laterality: Right;    ALLERGIES: Allergies  Allergen Reactions   Bactrim  [Sulfamethoxazole -Trimethoprim ]     Runs pt's sugar and phosphorus   Doxycycline  Other (See Comments)    Runs up pt's blood sugar and phosphorus   Flagyl  [Metronidazole ] Rash    Patient having hives to either the vancomycin  or flagyl  (both infusing together).   Vancomycin  Rash    Patient having hives to either vancomycin  or flagyl     FAMILY HISTORY:  Family History  Problem Relation Age of Onset   Diabetes Mother    Heart  disease Father    Colon cancer Neg Hx     SOCIAL HISTORY: Social History   Socioeconomic History   Marital status: Married    Spouse name: Not on file   Number of children: Not on file   Years of education: Not on file   Highest education level: Not on file  Occupational History   Not on file  Tobacco Use   Smoking status: Never   Smokeless tobacco: Never  Vaping Use   Vaping status: Never Used  Substance and Sexual Activity   Alcohol use: No    Alcohol/week: 0.0 standard drinks of alcohol   Drug use: No   Sexual activity: Yes  Other Topics Concern   Not on file  Social History Narrative   ** Merged History Encounter **       Social Drivers of Health   Financial Resource Strain: Low Risk  (01/21/2024)   Overall Financial Resource Strain (CARDIA)    Difficulty of Paying Living Expenses: Not hard at all  Food Insecurity: No Food Insecurity (01/21/2024)   Hunger Vital Sign    Worried About Running Out of Food in the Last Year: Never true    Ran Out of Food in the Last Year: Never true  Transportation Needs: No Transportation  Needs (01/21/2024)   PRAPARE - Administrator, Civil Service (Medical): No    Lack of Transportation (Non-Medical): No  Physical Activity: Sufficiently Active (01/21/2024)   Exercise Vital Sign    Days of Exercise per Week: 5 days    Minutes of Exercise per Session: 60 min  Stress: Stress Concern Present (01/21/2024)   [employer] of Occupational Health - Occupational Stress Questionnaire    Feeling of Stress: Very much  Social Connections: Moderately Integrated (01/21/2024)   Social Connection and Isolation Panel    Frequency of Communication with Friends and Family: More than three times a week    Frequency of Social Gatherings with Friends and Family: Once a week    Attends Religious Services: 1 to 4 times per year    Active Member of [employer] or Organizations: No    Attends Banker Meetings: Never    Marital Status: Married    MEDICATIONS:  Current Outpatient Medications  Medication Sig Dispense Refill   albuterol  (VENTOLIN  HFA) 108 (90 Base) MCG/ACT inhaler Inhale 2 puffs into the lungs every 6 (six) hours as needed for wheezing or shortness of breath.     atorvastatin  (LIPITOR) 10 MG tablet Take 1 tablet by mouth once daily 30 tablet 0   Azathioprine  75 MG TABS Take 150 mg by mouth.     blood glucose meter kit and supplies KIT Dispense based on patient and insurance preference. Use up to four times daily as directed. (FOR ICD-9 250.00, 250.01). 1 each 0   Blood Glucose Monitoring Suppl (ACCU-CHEK GUIDE ME) w/Device KIT 1 Piece by Does not apply route as directed. 1 kit 0   calcitRIOL (ROCALTROL) 0.25 MCG capsule Take 0.25 mcg by mouth.     cloNIDine  (CATAPRES  - DOSED IN MG/24 HR) 0.3 mg/24hr patch Place 1 patch (0.3 mg total) onto the skin once a week. 4 patch 12   clopidogrel  (PLAVIX ) 75 MG tablet Take 75 mg by mouth daily.     clotrimazole (MYCELEX) 10 MG troche Take 10 mg by mouth 5 (five) times daily.     Continuous Blood Gluc Receiver (DEXCOM G7  RECEIVER) DEVI Use to check blood sugar 3 times a  day. Dx code e11.65 1 each 1   Continuous Glucose Sensor (DEXCOM G7 SENSOR) MISC USE TO CHECK GLUCOSE THREE TIMES DAILY AS DIRECTED, CHANGE SENSOR EVERY 10 DAYS. 9 each 3   doxazosin  (CARDURA ) 4 MG tablet Take 4 mg by mouth 2 (two) times daily.     Dulaglutide  (TRULICITY ) 3 MG/0.5ML SOAJ Inject 3 mg as directed once a week. 6 mL 4   furosemide  (LASIX ) 80 MG tablet Take 80 mg by mouth 2 (two) times daily as needed.     gabapentin  (NEURONTIN ) 300 MG capsule Take 300 mg by mouth.     glucose blood (ACCU-CHEK GUIDE) test strip Use as instructed 4 x daily. E11.65 150 each 5   hydrALAZINE  (APRESOLINE ) 25 MG tablet Take 2 tablets (50 mg total) by mouth 3 (three) times daily.     latanoprost  (XALATAN ) 0.005 % ophthalmic solution Place 1 drop into both eyes at bedtime.     Multiple Vitamin (MULTIVITAMIN) capsule Take 1 capsule by mouth daily.     mycophenolate  (MYFORTIC ) 360 MG TBEC EC tablet Take 720 mg by mouth 2 (two) times daily.     NOVOLIN  N FLEXPEN 100 UNIT/ML FlexPen Inject 30 Units into the skin 2 (two) times daily before a meal. Takes 30 UNITS SUBCUTANEOUSLY IN THE MORNING with breakfast AND 30 UNITS with SUPPER.     omeprazole  (PRILOSEC) 40 MG capsule Take 40 mg by mouth every morning.     oxyCODONE -acetaminophen  (PERCOCET/ROXICET) 5-325 MG tablet Take 1 tablet by mouth every 6 (six) hours as needed for severe pain (pain score 7-10). 10 tablet 0   oxyCODONE -acetaminophen  (PERCOCET/ROXICET) 5-325 MG tablet Take 1 tablet by mouth every 6 (six) hours as needed for severe pain (pain score 7-10). 6 tablet 0   predniSONE  (DELTASONE ) 10 MG tablet Take 5 mg by mouth 2 (two) times daily with a meal.     sevelamer (RENAGEL) 800 MG tablet Take 800 mg by mouth 3 (three) times daily with meals. Swallow tablet whole do not crush ,break,or chew.     tacrolimus  (PROGRAF ) 1 MG capsule Take 4 mg by mouth 2 (two) times daily.     Vitamin D , Ergocalciferol , (DRISDOL )  50000 units CAPS capsule Take 50,000 Units by mouth See admin instructions. 15th of the month     insulin  aspart (NOVOLOG  FLEXPEN) 100 UNIT/ML FlexPen Take 5-10 units with meals 3 times a day, maximum 30  units /day.Take 0-10 units with meals 3 times a day, maximum 30  units /day. 15 mL 4   No current facility-administered medications for this visit.    PHYSICAL EXAM: Vitals:   02/02/24 1536  BP: 120/70  Pulse: 87  Resp: 16  SpO2: 94%  Weight: 207 lb 14.4 oz (94.3 kg)  Height: 5' 7 (1.702 m)     Body mass index is 32.56 kg/m.  Wt Readings from Last 3 Encounters:  02/02/24 207 lb 14.4 oz (94.3 kg)  01/21/24 209 lb 6.4 oz (95 kg)  11/06/23 208 lb 12.4 oz (94.7 kg)    General: Well developed, well nourished male in no apparent distress.  HEENT: AT/[place], no external lesions.  Eyes: Conjunctiva clear and no icterus. Neck: Neck supple  Lungs: Respirations not labored Neurologic: Alert, oriented, normal speech Extremities / Skin: Dry. No sores or rashes noted. Rt BKA. Psychiatric: Does not appear depressed or anxious   Diabetic Foot Exam - Simple   No data filed    LABS Reviewed Lab Results  Component Value Date  HGBA1C 8.1 (H) 01/21/2024   HGBA1C 7.2 (H) 09/07/2023   HGBA1C 8.4 (H) 01/27/2023   Lab Results  Component Value Date   FRUCTOSAMINE 327 (H) 01/14/2022   Lab Results  Component Value Date   CHOL 168 09/07/2023   HDL 59 09/07/2023   LDLCALC 93 09/07/2023   TRIG 84 09/07/2023   CHOLHDL 2.8 09/07/2023   Lab Results  Component Value Date   MICRALBCREAT 192 (H) 03/12/2023   MICRALBCREAT 30-300 12/12/2019   Lab Results  Component Value Date   CREATININE 6.34 (H) 11/02/2023   Lab Results  Component Value Date   GFR 53.77 (L) 05/13/2022    ASSESSMENT / PLAN  1. Uncontrolled type 2 diabetes mellitus with hyperglycemia, with long-term current use of insulin  (HCC)      Diabetes Mellitus type 2, complicated by peripheral neuropathy/diabetic foot  ulcer status post right BKA. - Diabetic status / severity: Uncontrolled.  Worsening.  Lab Results  Component Value Date   HGBA1C 8.1 (H) 01/21/2024    - Hemoglobin A1c goal : <7%  Discussed about compliance of the insulin .  Discussed about difference between Novolin  N and at the NovoLog  insulin .  He has not been fully compliant with NovoLog  with meals.  He is having postprandial frequent hyperglycemia.  Discussed about using NovoLog  frequently, in the range of 5 to 10 units.  Depending on the meal size and carbohydrate content advised to take NovoLog  up to 10 units.  Discussed in detail.   Adjusted insulin  regimen as follows.  - Medications: Adjusted as follows. Diabetes regimen: Continue Novolin  N 30 units with breakfast and 30 unit with dinner/supper. Adjust Novolog  5 - 10 units with meals three times days, with breakfast, lunch and dinner. Continue trulicity  3 mg weekly.  - Home glucose testing: Dexcom G7/CGM and check as needed.   - Discussed/ Gave Hypoglycemia treatment plan.  # Consult : not required at this time.   # Annual urine for microalbuminuria/ creatinine ratio, + microalbuminuria, not on ARB.  Following with nephrology. Last  Lab Results  Component Value Date   MICRALBCREAT 192 (H) 03/12/2023    # Foot check nightly / neuropathy, continue gabapentin .  # Annual dilated diabetic eye exams.  Advised to have diabetic eye exam.  - Diet: Eat reasonable portion sizes to promote a healthy weight  2. Blood pressure  -  BP Readings from Last 1 Encounters:  02/02/24 120/70    - Control is in target.  - No change in current plans.  3. Lipid status / Hyperlipidemia - Last  Lab Results  Component Value Date   LDLCALC 93 09/07/2023   - Continue atorvastatin  10 mg daily.  Managed by primary care provider.  Diagnoses and all orders for this visit:  Uncontrolled type 2 diabetes mellitus with hyperglycemia, with long-term current use of insulin  (HCC) -      insulin  aspart (NOVOLOG  FLEXPEN) 100 UNIT/ML FlexPen; Take 5-10 units with meals 3 times a day, maximum 30  units /day.Take 0-10 units with meals 3 times a day, maximum 30  units /day.   DISPOSITION Follow up in clinic in 3 months suggested.   All questions answered and patient verbalized understanding of the plan.  Raymond Margert Edsall, MD [facility] Endocrinology [facility] [address] [place], [address] [name], [place] [address] Phone # [phone]  At least part of this note was generated using voice recognition software. Inadvertent word errors may have occurred, which were not recognized during the proofreading process.

## 2024-02-03 ENCOUNTER — Encounter: Payer: Self-pay | Admitting: Oncology

## 2024-02-03 ENCOUNTER — Telehealth: Payer: Self-pay | Admitting: Pharmacy Technician

## 2024-02-03 ENCOUNTER — Other Ambulatory Visit (HOSPITAL_COMMUNITY): Payer: Self-pay

## 2024-02-03 NOTE — Telephone Encounter (Signed)
 Pharmacy Patient Advocate Encounter   Received notification from CoverMyMeds that prior authorization for NovoLOG  FlexPen 100UNIT/ML pen-injectors is required/requested.   Insurance verification completed.   The patient is insured through [facility] .   Per test claim:  HUMALOG  is preferred by the insurance.  If suggested medication is appropriate, Please send in a new RX and discontinue this one. If not, please advise as to why it's not appropriate so that we may request a Prior Authorization. Please note, some preferred medications may still require a PA.  If the suggested medications have not been trialed and there are no contraindications to their use, the PA will not be submitted, as it will not be approved.   **Patient has a new insurance now. Novolog  is excluded from the plan unless there is clinical rationale provided**

## 2024-02-04 ENCOUNTER — Ambulatory Visit: Payer: Self-pay | Admitting: Nurse Practitioner

## 2024-02-04 ENCOUNTER — Ambulatory Visit (INDEPENDENT_AMBULATORY_CARE_PROVIDER_SITE_OTHER): Admitting: Nurse Practitioner

## 2024-02-04 ENCOUNTER — Other Ambulatory Visit: Payer: Self-pay | Admitting: Endocrinology

## 2024-02-04 ENCOUNTER — Encounter: Payer: Self-pay | Admitting: Family Medicine

## 2024-02-04 ENCOUNTER — Other Ambulatory Visit: Payer: Self-pay

## 2024-02-04 VITALS — BP 116/80 | Temp 99.5°F

## 2024-02-04 DIAGNOSIS — M791 Myalgia, unspecified site: Secondary | ICD-10-CM | POA: Diagnosis not present

## 2024-02-04 DIAGNOSIS — R5383 Other fatigue: Secondary | ICD-10-CM | POA: Diagnosis not present

## 2024-02-04 DIAGNOSIS — M79641 Pain in right hand: Secondary | ICD-10-CM

## 2024-02-04 DIAGNOSIS — R768 Other specified abnormal immunological findings in serum: Secondary | ICD-10-CM

## 2024-02-04 DIAGNOSIS — M79642 Pain in left hand: Secondary | ICD-10-CM | POA: Diagnosis not present

## 2024-02-04 DIAGNOSIS — G4700 Insomnia, unspecified: Secondary | ICD-10-CM | POA: Diagnosis not present

## 2024-02-04 DIAGNOSIS — E1165 Type 2 diabetes mellitus with hyperglycemia: Secondary | ICD-10-CM

## 2024-02-04 DIAGNOSIS — E1122 Type 2 diabetes mellitus with diabetic chronic kidney disease: Secondary | ICD-10-CM

## 2024-02-04 DIAGNOSIS — K591 Functional diarrhea: Secondary | ICD-10-CM

## 2024-02-04 DIAGNOSIS — N186 End stage renal disease: Secondary | ICD-10-CM

## 2024-02-04 DIAGNOSIS — R52 Pain, unspecified: Secondary | ICD-10-CM

## 2024-02-04 LAB — CBC WITH DIFFERENTIAL/PLATELET
Basophils Absolute: 0 x10E3/uL (ref 0.0–0.2)
Basos: 1 %
EOS (ABSOLUTE): 0.1 x10E3/uL (ref 0.0–0.4)
Eos: 2 %
Hematocrit: 26.7 % — ABNORMAL LOW (ref 37.5–51.0)
Hemoglobin: 7.5 g/dL — ABNORMAL LOW (ref 13.0–17.7)
Immature Grans (Abs): 0 x10E3/uL (ref 0.0–0.1)
Immature Granulocytes: 0 %
Lymphocytes Absolute: 1.4 x10E3/uL (ref 0.7–3.1)
Lymphs: 19 %
MCH: 23.1 pg — ABNORMAL LOW (ref 26.6–33.0)
MCHC: 28.1 g/dL — ABNORMAL LOW (ref 31.5–35.7)
MCV: 82 fL (ref 79–97)
Monocytes Absolute: 0.6 x10E3/uL (ref 0.1–0.9)
Monocytes: 8 %
Neutrophils Absolute: 5 x10E3/uL (ref 1.4–7.0)
Neutrophils: 70 %
Platelets: 343 x10E3/uL (ref 150–450)
RBC: 3.25 x10E6/uL — ABNORMAL LOW (ref 4.14–5.80)
RDW: 14.5 % (ref 11.6–15.4)
WBC: 7.1 x10E3/uL (ref 3.4–10.8)

## 2024-02-04 MED ORDER — ACETAMINOPHEN 500 MG PO TABS
1000.0000 mg | ORAL_TABLET | Freq: Three times a day (TID) | ORAL | 2 refills | Status: DC | PRN
Start: 2024-02-04 — End: 2024-02-16

## 2024-02-04 MED ORDER — PREDNISONE 10 MG PO TABS: 5.0000 mg | ORAL_TABLET | Freq: Every day | ORAL | Status: DC

## 2024-02-04 MED ORDER — TRIAMCINOLONE ACETONIDE 40 MG/ML IJ SUSP
60.0000 mg | Freq: Once | INTRAMUSCULAR | Status: AC
  Administered 2024-02-04: 60 mg via INTRAMUSCULAR

## 2024-02-04 MED ORDER — OXYCODONE-ACETAMINOPHEN 5-325 MG PO TABS
1.0000 | ORAL_TABLET | Freq: Three times a day (TID) | ORAL | 0 refills | Status: DC | PRN
Start: 2024-02-04 — End: 2024-02-16

## 2024-02-04 MED ORDER — INSULIN LISPRO (1 UNIT DIAL) 100 UNIT/ML (KWIKPEN): 5.0000 [IU] | PEN_INJECTOR | Freq: Three times a day (TID) | SUBCUTANEOUS | 11 refills | Status: DC

## 2024-02-04 MED ORDER — PREDNISONE 5 MG PO TABS: 5.0000 mg | ORAL_TABLET | Freq: Every day | ORAL | Status: DC

## 2024-02-04 NOTE — Progress Notes (Signed)
 I,Raymond [name], CMA,acting as a Neurosurgeon for [employer], FNP.,have documented all relevant documentation on the behalf of Raymond Ada, FNP,as directed by  Raymond Ada, FNP while in the presence of Raymond Ada, FNP.  Subjective:  Patient ID: Raymond [name] , male    DOB: 18-Jul-1960 , 63 y.o.   MRN: [id number]  Chief Complaint  Patient presents with   bodyaches    Patient presents today for bodyaches, diarrhea,swollen hands and very tender. Patient reports the swelling has gotten worse. He can't sleep    HPI Discussed the use of AI scribe software for clinical note transcription with the patient, who gave verbal consent to proceed.  History of Present Illness Raymond [name] is a 63 year old male with end-stage renal disease who presents with persistent diarrhea and weakness.  He has been experiencing persistent diarrhea for the past three weeks, occurring approximately three times a day. The diarrhea began after resuming dialysis a month and a half ago. Despite using Imodium , the diarrhea persists, leading to difficulty maintaining hydration and nutrition, contributing to his weakness.  He describes associated symptoms of weakness and fatigue, with an inability to keep food down effectively, although there is no nausea or vomiting. He has a decreased appetite, often feeling full after only a few bites of food. He experiences chest discomfort, described as muscle pulling when lying down. Swelling in his hands and legs is noted.  He has a history of end-stage renal disease and resumed dialysis a month and a half ago due to non-functioning kidneys. He is on the transplant list for a new kidney. During dialysis, he has experienced episodes of low blood pressure, necessitating temporary cessation of the process.  He reports hand pain and swelling, with recent lab results indicating a positive ANA and elevated SED rate. He experiences body aches, particularly in the  shoulders, and has been taking prednisone  5 mg once daily in the morning.  He mentions a history of acid reflux, which is currently not well-controlled, preventing him from undergoing a colonoscopy. He also reports a history of low protein levels and difficulty maintaining adequate nutrition due to his poor appetite.  He takes Tylenol  325 mg every four hours for pain management, but finds it ineffective. He previously used Percocet for pain following a rib injury in April, prior to resuming dialysis.  No recent antibiotic use or infections, although he recalls a past elbow infection requiring surgical intervention. He has not experienced any recent elbow swelling, but reports persistent elbow pain.   Diarrhea  This is a new problem. The current episode started 1 to 4 weeks ago. The problem has been gradually worsening. Associated symptoms include arthralgias, chills, a fever (had dialysis today) and myalgias. Pertinent negatives include no abdominal pain, headaches or vomiting.     Past Medical History:  Diagnosis Date   Diabetes (HCC)    Diabetes mellitus without complication (HCC)    type 2   ESRD (end stage renal disease) (HCC)    03/09/2015- patient had a kidney transplant   Hypertension    Nausea vomiting and diarrhea 12/10/2015   Peripheral vascular disease (HCC)    Renal disorder    Renal insufficiency      Family History  Problem Relation Age of Onset   Diabetes Mother    Heart disease Father    Colon cancer Neg Hx      Current Outpatient Medications:    acetaminophen  (TYLENOL ) 500 MG tablet, Take 2 tablets (1,000 mg total) by  mouth every 8 (eight) hours as needed. (Patient taking differently: Take 1,000 mg by mouth every 6 (six) hours as needed for mild pain (pain score 1-3).), Disp: 100 tablet, Rfl: 2   atorvastatin  (LIPITOR) 10 MG tablet, Take 1 tablet by mouth once daily, Disp: 30 tablet, Rfl: 0   Azathioprine  75 MG TABS, Take 150 mg by mouth daily., Disp: , Rfl:     blood glucose meter kit and supplies KIT, Dispense based on patient and insurance preference. Use up to four times daily as directed. (FOR ICD-9 250.00, 250.01)., Disp: 1 each, Rfl: 0   Blood Glucose Monitoring Suppl (ACCU-CHEK GUIDE ME) w/Device KIT, 1 Piece by Does not apply route as directed., Disp: 1 kit, Rfl: 0   calcitRIOL  (ROCALTROL ) 0.25 MCG capsule, Take 0.25 mcg by mouth., Disp: , Rfl:    cloNIDine  (CATAPRES  - DOSED IN MG/24 HR) 0.3 mg/24hr patch, Place 1 patch (0.3 mg total) onto the skin once a week. (Patient taking differently: Place 0.3 mg onto the skin every Sunday.), Disp: 4 patch, Rfl: 12   clopidogrel  (PLAVIX ) 75 MG tablet, Take 75 mg by mouth daily., Disp: , Rfl:    Continuous Blood Gluc Receiver (DEXCOM G7 RECEIVER) DEVI, Use to check blood sugar 3 times a day. Dx code e11.65, Disp: 1 each, Rfl: 1   Continuous Glucose Sensor (DEXCOM G7 SENSOR) MISC, USE TO CHECK GLUCOSE THREE TIMES DAILY AS DIRECTED, CHANGE SENSOR EVERY 10 DAYS., Disp: 9 each, Rfl: 3   doxazosin  (CARDURA ) 4 MG tablet, Take 4 mg by mouth 2 (two) times daily., Disp: , Rfl:    Dulaglutide  (TRULICITY ) 3 MG/0.5ML SOAJ, Inject 3 mg as directed once a week. (Patient taking differently: Inject 3 mg as directed every Sunday.), Disp: 6 mL, Rfl: 4   furosemide  (LASIX ) 80 MG tablet, Take 80 mg by mouth 2 (two) times daily as needed for fluid or edema., Disp: , Rfl:    gabapentin  (NEURONTIN ) 300 MG capsule, Take 300 mg by mouth at bedtime., Disp: , Rfl:    glucose blood (ACCU-CHEK GUIDE) test strip, Use as instructed 4 x daily. E11.65, Disp: 150 each, Rfl: 5   hydrALAZINE  (APRESOLINE ) 25 MG tablet, Take 2 tablets (50 mg total) by mouth 3 (three) times daily., Disp: , Rfl:    insulin  lispro (HUMALOG  KWIKPEN) 100 UNIT/ML KwikPen, Inject 5-10 Units into the skin 3 (three) times daily. Maximum 30 units/day., Disp: 15 mL, Rfl: 11   latanoprost  (XALATAN ) 0.005 % ophthalmic solution, Place 1 drop into both eyes at bedtime., Disp: ,  Rfl:    Multiple Vitamin (MULTIVITAMIN) capsule, Take 1 capsule by mouth daily., Disp: , Rfl:    omeprazole  (PRILOSEC) 40 MG capsule, Take 40 mg by mouth every morning., Disp: , Rfl:    tacrolimus  (PROGRAF ) 1 MG capsule, Take 3 mg by mouth 2 (two) times daily., Disp: , Rfl:    Vitamin D , Ergocalciferol , (DRISDOL ) 50000 units CAPS capsule, Take 50,000 Units by mouth every 30 (thirty) days. 15th of the month, Disp: , Rfl:    loperamide  (IMODIUM ) 2 MG capsule, Take 2 mg by mouth as needed for diarrhea or loose stools., Disp: , Rfl:    oxyCODONE -acetaminophen  (PERCOCET/ROXICET) 5-325 MG tablet, Take 1 tablet by mouth every 8 (eight) hours as needed for severe pain (pain score 7-10)., Disp: 20 tablet, Rfl: 0   predniSONE  (DELTASONE ) 5 MG tablet, Take 1 tablet (5 mg total) by mouth daily with breakfast., Disp: , Rfl:    sevelamer  carbonate (RENVELA ) 800  MG tablet, Take 800 mg by mouth 3 (three) times daily with meals., Disp: , Rfl:    Vonoprazan Fumarate  (VOQUEZNA ) 10 MG TABS, Take 1 tablet by mouth daily., Disp: , Rfl:    Allergies  Allergen Reactions   Bactrim  [Sulfamethoxazole -Trimethoprim ] Other (See Comments)    Runs pt's sugar and phosphorus   Doxycycline  Other (See Comments)    Runs up pt's blood sugar and phosphorus   Flagyl  [Metronidazole ] Hives and Rash    Patient having hives to either the vancomycin  or flagyl  (both infusing together).   Vancomycin  Hives and Rash    Patient having hives to either vancomycin  or flagyl      Review of Systems  Constitutional:  Positive for chills and fever (had dialysis today).  Gastrointestinal:  Positive for diarrhea. Negative for abdominal pain and vomiting.  Musculoskeletal:  Positive for arthralgias and myalgias.  Neurological:  Negative for headaches.     Today's Vitals   02/04/24 1608  BP: 116/80  Temp: 99.5 F (37.5 C)  TempSrc: Oral  PainSc: 10-Worst pain ever   There is no height or weight on file to calculate BMI.  Wt Readings from  Last 3 Encounters:  02/11/24 204 lb 9.4 oz (92.8 kg)  02/02/24 207 lb 14.4 oz (94.3 kg)  01/21/24 209 lb 6.4 oz (95 kg)     Objective:  Physical Exam Vitals and nursing note reviewed.  Constitutional:      General: He is not in acute distress.    Appearance: Normal appearance. He is obese.  Cardiovascular:     Rate and Rhythm: Normal rate and regular rhythm.     Pulses: Normal pulses.     Heart sounds: Normal heart sounds. No murmur heard. Pulmonary:     Effort: Pulmonary effort is normal. No respiratory distress.     Breath sounds: Normal breath sounds. No wheezing.  Musculoskeletal:        General: Tenderness present.     Comments: Right below the knee amputation  Skin:    General: Skin is warm.     Capillary Refill: Capillary refill takes less than 2 seconds.  Neurological:     General: No focal deficit present.     Mental Status: He is alert and oriented to person, place, and time.     Cranial Nerves: No cranial nerve deficit.     Motor: No weakness.  Psychiatric:        Mood and Affect: Mood normal.        Behavior: Behavior normal.        Thought Content: Thought content normal.        Judgment: Judgment normal.      Assessment And Plan:  Body aches -     Triamcinolone  Acetonide -     oxyCODONE -Acetaminophen ; Take 1 tablet by mouth every 8 (eight) hours as needed for severe pain (pain score 7-10).  Dispense: 20 tablet; Refill: 0  Insomnia, unspecified type  Other fatigue Assessment & Plan: Will check CBC  Orders: -     EKG 12-Lead -     CBC with Differential/Platelet  Positive ANA (antinuclear antibody) Assessment & Plan: Positive ANA and elevated SED rate suggestive of inflammatory disorder. Pain management complicated by renal failure. - Refer to rheumatologist for further evaluation. - Administer Kenalog  injection for pain management. - Prescribe limited supply of Percocet for severe pain, caution with Tylenol  intake. - Advise on Tylenol  intake: if  taking Percocet twice a day, limit Tylenol  to once a day,  and vice versa.  Orders: -     Ambulatory referral to Rheumatology  Pain in both hands -     Ambulatory referral to Rheumatology -     CBC with Differential/Platelet -     Triamcinolone  Acetonide -     oxyCODONE -Acetaminophen ; Take 1 tablet by mouth every 8 (eight) hours as needed for severe pain (pain score 7-10).  Dispense: 20 tablet; Refill: 0  Myalgia -     Ambulatory referral to Rheumatology -     CBC with Differential/Platelet -     Triamcinolone  Acetonide -     oxyCODONE -Acetaminophen ; Take 1 tablet by mouth every 8 (eight) hours as needed for severe pain (pain score 7-10).  Dispense: 20 tablet; Refill: 0  Functional diarrhea Assessment & Plan: Diarrhea for three weeks, possibly related to dialysis. Need to rule out viral or bacterial cause. - Advise against Imodium  to allow potential infection clearance. - Encourage hydration with Gatorade Zero, water, or Pedialyte. - Provide stool cups for culture to check for infection. - Instruct to go to ER if symptoms worsen or dehydration occurs.  Orders: -     Stool culture -     Ova and parasite examination -     Clostridium difficile EIA  Type 2 diabetes mellitus with ESRD (end-stage renal disease) (HCC) Assessment & Plan: A1c increased to 8.1. Management complicated by dietary intake issues due to diarrhea and GERD. - Encourage dietary intake to manage blood glucose levels.   Other orders -     predniSONE ; Take 1 tablet (5 mg total) by mouth daily with breakfast. -     Acetaminophen ; Take 2 tablets (1,000 mg total) by mouth every 8 (eight) hours as needed. (Patient taking differently: Take 1,000 mg by mouth every 6 (six) hours as needed for mild pain (pain score 1-3).)  Dispense: 100 tablet; Refill: 2    Return if symptoms worsen or fail to improve.  Patient was given opportunity to ask questions. Patient verbalized understanding of the plan and was able to repeat  key elements of the plan. All questions were answered to their satisfaction.   LILLETTE Raymond Ada, FNP, have reviewed all documentation for this visit. The documentation on 02/04/24 for the exam, diagnosis, procedures, and orders are all accurate and complete.    IF YOU HAVE BEEN REFERRED TO A SPECIALIST, IT MAY TAKE 1-2 WEEKS TO SCHEDULE/PROCESS THE REFERRAL. IF YOU HAVE NOT HEARD FROM US /SPECIALIST IN TWO WEEKS, PLEASE GIVE US  A CALL AT [phone].

## 2024-02-05 ENCOUNTER — Ambulatory Visit: Payer: Self-pay | Admitting: Nurse Practitioner

## 2024-02-09 ENCOUNTER — Emergency Department (HOSPITAL_COMMUNITY)

## 2024-02-09 ENCOUNTER — Encounter (HOSPITAL_COMMUNITY): Payer: Self-pay | Admitting: Emergency Medicine

## 2024-02-09 ENCOUNTER — Ambulatory Visit: Attending: Internal Medicine | Admitting: Audiologist

## 2024-02-09 ENCOUNTER — Other Ambulatory Visit: Payer: Self-pay

## 2024-02-09 DIAGNOSIS — E66812 Obesity, class 2: Secondary | ICD-10-CM | POA: Diagnosis present

## 2024-02-09 DIAGNOSIS — R55 Syncope and collapse: Secondary | ICD-10-CM | POA: Diagnosis present

## 2024-02-09 DIAGNOSIS — T82858A Stenosis of vascular prosthetic devices, implants and grafts, initial encounter: Secondary | ICD-10-CM | POA: Diagnosis not present

## 2024-02-09 DIAGNOSIS — Z7952 Long term (current) use of systemic steroids: Secondary | ICD-10-CM

## 2024-02-09 DIAGNOSIS — E1042 Type 1 diabetes mellitus with diabetic polyneuropathy: Secondary | ICD-10-CM | POA: Diagnosis present

## 2024-02-09 DIAGNOSIS — Z833 Family history of diabetes mellitus: Secondary | ICD-10-CM

## 2024-02-09 DIAGNOSIS — E1142 Type 2 diabetes mellitus with diabetic polyneuropathy: Secondary | ICD-10-CM | POA: Insufficient documentation

## 2024-02-09 DIAGNOSIS — G9341 Metabolic encephalopathy: Secondary | ICD-10-CM | POA: Diagnosis present

## 2024-02-09 DIAGNOSIS — I214 Non-ST elevation (NSTEMI) myocardial infarction: Principal | ICD-10-CM

## 2024-02-09 DIAGNOSIS — I492 Junctional premature depolarization: Secondary | ICD-10-CM | POA: Diagnosis present

## 2024-02-09 DIAGNOSIS — D84821 Immunodeficiency due to drugs: Secondary | ICD-10-CM | POA: Diagnosis present

## 2024-02-09 DIAGNOSIS — D631 Anemia in chronic kidney disease: Secondary | ICD-10-CM | POA: Diagnosis present

## 2024-02-09 DIAGNOSIS — E872 Acidosis, unspecified: Secondary | ICD-10-CM | POA: Diagnosis present

## 2024-02-09 DIAGNOSIS — Z66 Do not resuscitate: Secondary | ICD-10-CM | POA: Diagnosis present

## 2024-02-09 DIAGNOSIS — I5021 Acute systolic (congestive) heart failure: Secondary | ICD-10-CM | POA: Diagnosis not present

## 2024-02-09 DIAGNOSIS — N186 End stage renal disease: Principal | ICD-10-CM | POA: Diagnosis present

## 2024-02-09 DIAGNOSIS — E1022 Type 1 diabetes mellitus with diabetic chronic kidney disease: Secondary | ICD-10-CM | POA: Diagnosis present

## 2024-02-09 DIAGNOSIS — R579 Shock, unspecified: Secondary | ICD-10-CM

## 2024-02-09 DIAGNOSIS — T8612 Kidney transplant failure: Secondary | ICD-10-CM | POA: Diagnosis present

## 2024-02-09 DIAGNOSIS — Z8249 Family history of ischemic heart disease and other diseases of the circulatory system: Secondary | ICD-10-CM

## 2024-02-09 DIAGNOSIS — Z882 Allergy status to sulfonamides status: Secondary | ICD-10-CM

## 2024-02-09 DIAGNOSIS — Z7985 Long-term (current) use of injectable non-insulin antidiabetic drugs: Secondary | ICD-10-CM

## 2024-02-09 DIAGNOSIS — I252 Old myocardial infarction: Secondary | ICD-10-CM

## 2024-02-09 DIAGNOSIS — Z6832 Body mass index (BMI) 32.0-32.9, adult: Secondary | ICD-10-CM

## 2024-02-09 DIAGNOSIS — R001 Bradycardia, unspecified: Secondary | ICD-10-CM

## 2024-02-09 DIAGNOSIS — R570 Cardiogenic shock: Secondary | ICD-10-CM | POA: Diagnosis not present

## 2024-02-09 DIAGNOSIS — R7989 Other specified abnormal findings of blood chemistry: Secondary | ICD-10-CM

## 2024-02-09 DIAGNOSIS — Z794 Long term (current) use of insulin: Secondary | ICD-10-CM

## 2024-02-09 DIAGNOSIS — K429 Umbilical hernia without obstruction or gangrene: Secondary | ICD-10-CM | POA: Diagnosis present

## 2024-02-09 DIAGNOSIS — Z79899 Other long term (current) drug therapy: Secondary | ICD-10-CM

## 2024-02-09 DIAGNOSIS — Z881 Allergy status to other antibiotic agents status: Secondary | ICD-10-CM

## 2024-02-09 DIAGNOSIS — I442 Atrioventricular block, complete: Secondary | ICD-10-CM | POA: Diagnosis present

## 2024-02-09 DIAGNOSIS — Z89511 Acquired absence of right leg below knee: Secondary | ICD-10-CM

## 2024-02-09 DIAGNOSIS — Z515 Encounter for palliative care: Secondary | ICD-10-CM

## 2024-02-09 DIAGNOSIS — I132 Hypertensive heart and chronic kidney disease with heart failure and with stage 5 chronic kidney disease, or end stage renal disease: Secondary | ICD-10-CM | POA: Diagnosis present

## 2024-02-09 DIAGNOSIS — Z89512 Acquired absence of left leg below knee: Secondary | ICD-10-CM

## 2024-02-09 DIAGNOSIS — Y713 Surgical instruments, materials and cardiovascular devices (including sutures) associated with adverse incidents: Secondary | ICD-10-CM | POA: Diagnosis not present

## 2024-02-09 DIAGNOSIS — Z79621 Long term (current) use of calcineurin inhibitor: Secondary | ICD-10-CM

## 2024-02-09 DIAGNOSIS — I2510 Atherosclerotic heart disease of native coronary artery without angina pectoris: Secondary | ICD-10-CM | POA: Diagnosis present

## 2024-02-09 DIAGNOSIS — Z28310 Unvaccinated for covid-19: Secondary | ICD-10-CM

## 2024-02-09 DIAGNOSIS — N2581 Secondary hyperparathyroidism of renal origin: Secondary | ICD-10-CM | POA: Diagnosis present

## 2024-02-09 DIAGNOSIS — I10 Essential (primary) hypertension: Secondary | ICD-10-CM | POA: Diagnosis present

## 2024-02-09 DIAGNOSIS — E782 Mixed hyperlipidemia: Secondary | ICD-10-CM | POA: Diagnosis present

## 2024-02-09 DIAGNOSIS — Z992 Dependence on renal dialysis: Principal | ICD-10-CM

## 2024-02-09 DIAGNOSIS — E103511 Type 1 diabetes mellitus with proliferative diabetic retinopathy with macular edema, right eye: Secondary | ICD-10-CM | POA: Diagnosis present

## 2024-02-09 DIAGNOSIS — I739 Peripheral vascular disease, unspecified: Secondary | ICD-10-CM | POA: Diagnosis present

## 2024-02-09 DIAGNOSIS — Z883 Allergy status to other anti-infective agents status: Secondary | ICD-10-CM

## 2024-02-09 DIAGNOSIS — E860 Dehydration: Secondary | ICD-10-CM | POA: Diagnosis present

## 2024-02-09 DIAGNOSIS — Z7902 Long term (current) use of antithrombotics/antiplatelets: Secondary | ICD-10-CM

## 2024-02-09 LAB — CBC WITH DIFFERENTIAL/PLATELET
Abs Immature Granulocytes: 0.05 K/uL (ref 0.00–0.07)
Basophils Absolute: 0 K/uL (ref 0.0–0.1)
Basophils Relative: 1 %
Eosinophils Absolute: 0 K/uL (ref 0.0–0.5)
Eosinophils Relative: 1 %
HCT: 29.3 % — ABNORMAL LOW (ref 39.0–52.0)
Hemoglobin: 7.5 g/dL — ABNORMAL LOW (ref 13.0–17.0)
Immature Granulocytes: 1 %
Lymphocytes Relative: 23 %
Lymphs Abs: 2 K/uL (ref 0.7–4.0)
MCH: 23.2 pg — ABNORMAL LOW (ref 26.0–34.0)
MCHC: 25.6 g/dL — ABNORMAL LOW (ref 30.0–36.0)
MCV: 90.7 fL (ref 80.0–100.0)
Monocytes Absolute: 0.7 K/uL (ref 0.1–1.0)
Monocytes Relative: 8 %
Neutro Abs: 5.9 K/uL (ref 1.7–7.7)
Neutrophils Relative %: 66 %
Platelets: 273 K/uL (ref 150–400)
RBC: 3.23 MIL/uL — ABNORMAL LOW (ref 4.22–5.81)
RDW: 16.9 % — ABNORMAL HIGH (ref 11.5–15.5)
WBC: 8.6 K/uL (ref 4.0–10.5)
nRBC: 0 % (ref 0.0–0.2)

## 2024-02-09 LAB — COMPREHENSIVE METABOLIC PANEL WITH GFR
ALT: 13 U/L (ref 0–44)
AST: 33 U/L (ref 15–41)
Albumin: 2.3 g/dL — ABNORMAL LOW (ref 3.5–5.0)
Alkaline Phosphatase: 125 U/L (ref 38–126)
Anion gap: 20 — ABNORMAL HIGH (ref 5–15)
BUN: 32 mg/dL — ABNORMAL HIGH (ref 8–23)
CO2: 20 mmol/L — ABNORMAL LOW (ref 22–32)
Calcium: 8.4 mg/dL — ABNORMAL LOW (ref 8.9–10.3)
Chloride: 97 mmol/L — ABNORMAL LOW (ref 98–111)
Creatinine, Ser: 5.54 mg/dL — ABNORMAL HIGH (ref 0.61–1.24)
GFR, Estimated: 11 mL/min — ABNORMAL LOW (ref >60)
Glucose, Bld: 244 mg/dL — ABNORMAL HIGH (ref 70–99)
Potassium: 3.7 mmol/L (ref 3.5–5.1)
Sodium: 137 mmol/L (ref 135–145)
Total Bilirubin: 0.8 mg/dL (ref 0.0–1.2)
Total Protein: 8.3 g/dL — ABNORMAL HIGH (ref 6.5–8.1)

## 2024-02-09 LAB — I-STAT CHEM 8, ED
BUN: 38 mg/dL — ABNORMAL HIGH (ref 8–23)
Calcium, Ion: 0.87 mmol/L — CL (ref 1.15–1.40)
Chloride: 100 mmol/L (ref 98–111)
Creatinine, Ser: 5.7 mg/dL — ABNORMAL HIGH (ref 0.61–1.24)
Glucose, Bld: 260 mg/dL — ABNORMAL HIGH (ref 70–99)
HCT: 26 % — ABNORMAL LOW (ref 39.0–52.0)
Hemoglobin: 8.8 g/dL — ABNORMAL LOW (ref 13.0–17.0)
Potassium: 5 mmol/L (ref 3.5–5.1)
Sodium: 135 mmol/L (ref 135–145)
TCO2: 25 mmol/L (ref 22–32)

## 2024-02-09 LAB — LACTIC ACID, PLASMA
Lactic Acid, Venous: 4.7 mmol/L (ref 0.5–1.9)
Lactic Acid, Venous: 3.6 mmol/L (ref 0.5–1.9)

## 2024-02-09 LAB — MAGNESIUM: Magnesium: 2.1 mg/dL (ref 1.7–2.4)

## 2024-02-09 LAB — TROPONIN I (HIGH SENSITIVITY)
Troponin I (High Sensitivity): 3329 ng/L (ref <18)
Troponin I (High Sensitivity): 3624 ng/L (ref <18)

## 2024-02-09 LAB — PHOSPHORUS: Phosphorus: 5.3 mg/dL — ABNORMAL HIGH (ref 2.5–4.6)

## 2024-02-09 MED ORDER — ONDANSETRON HCL 4 MG PO TABS: 4.0000 mg | ORAL_TABLET | Freq: Four times a day (QID) | ORAL | Status: DC | PRN

## 2024-02-09 MED ORDER — SODIUM CHLORIDE 0.9% FLUSH
3.0000 mL | Freq: Two times a day (BID) | INTRAVENOUS | Status: DC
  Administered 2024-02-09 – 2024-02-11 (×4): 3 mL via INTRAVENOUS

## 2024-02-09 MED ORDER — ACETAMINOPHEN 650 MG RE SUPP: 650.0000 mg | Freq: Four times a day (QID) | RECTAL | Status: DC | PRN

## 2024-02-09 MED ORDER — LACTATED RINGERS IV BOLUS
1000.0000 mL | Freq: Once | INTRAVENOUS | Status: AC
Start: 2024-02-09 — End: 2024-02-09
  Administered 2024-02-09: 1000 mL via INTRAVENOUS

## 2024-02-09 MED ORDER — ATORVASTATIN CALCIUM 10 MG PO TABS
10.0000 mg | ORAL_TABLET | Freq: Every day | ORAL | Status: DC
Start: 2024-02-10 — End: 2024-02-10

## 2024-02-09 MED ORDER — OXYCODONE-ACETAMINOPHEN 5-325 MG PO TABS
1.0000 | ORAL_TABLET | Freq: Three times a day (TID) | ORAL | Status: DC | PRN
  Administered 2024-02-11: 1 via ORAL
  Filled 2024-02-09: qty 1

## 2024-02-09 MED ORDER — SEVELAMER CARBONATE 800 MG PO TABS
800.0000 mg | ORAL_TABLET | Freq: Three times a day (TID) | ORAL | Status: DC
Start: 2024-02-10 — End: 2024-02-12
  Administered 2024-02-10 – 2024-02-11 (×4): 800 mg via ORAL
  Filled 2024-02-09 (×4): qty 1

## 2024-02-09 MED ORDER — TACROLIMUS 1 MG PO CAPS
3.0000 mg | ORAL_CAPSULE | Freq: Two times a day (BID) | ORAL | Status: DC
Start: 2024-02-09 — End: 2024-02-12
  Administered 2024-02-09 – 2024-02-11 (×5): 3 mg via ORAL
  Filled 2024-02-09 (×8): qty 3

## 2024-02-09 MED ORDER — FUROSEMIDE 20 MG PO TABS: 80.0000 mg | ORAL_TABLET | Freq: Two times a day (BID) | ORAL | Status: DC | PRN

## 2024-02-09 MED ORDER — AZATHIOPRINE 75 MG PO TABS
150.0000 mg | ORAL_TABLET | Freq: Every day | ORAL | Status: DC
Start: 2024-02-10 — End: 2024-02-12
  Administered 2024-02-10 – 2024-02-11 (×2): 150 mg via ORAL
  Filled 2024-02-09 (×2): qty 3
  Filled 2024-02-09 (×2): qty 2

## 2024-02-09 MED ORDER — MAGNESIUM HYDROXIDE 400 MG/5ML PO SUSP: 30.0000 mL | Freq: Every day | ORAL | Status: DC | PRN

## 2024-02-09 MED ORDER — DOXAZOSIN MESYLATE 4 MG PO TABS
4.0000 mg | ORAL_TABLET | Freq: Two times a day (BID) | ORAL | Status: DC
Start: 2024-02-09 — End: 2024-02-10
  Filled 2024-02-09 (×4): qty 1

## 2024-02-09 MED ORDER — CLONIDINE HCL 0.3 MG/24HR TD PTWK
0.3000 mg | MEDICATED_PATCH | TRANSDERMAL | Status: DC
Start: 2024-02-14 — End: 2024-02-10

## 2024-02-09 MED ORDER — PANTOPRAZOLE SODIUM 40 MG PO TBEC
40.0000 mg | DELAYED_RELEASE_TABLET | Freq: Every day | ORAL | Status: DC
  Administered 2024-02-10 – 2024-02-11 (×2): 40 mg via ORAL
  Filled 2024-02-09 (×2): qty 1

## 2024-02-09 MED ORDER — VITAMIN D (ERGOCALCIFEROL) 1.25 MG (50000 UNIT) PO CAPS: 50000.0000 [IU] | ORAL_CAPSULE | ORAL | Status: DC

## 2024-02-09 MED ORDER — HYDRALAZINE HCL 25 MG PO TABS
50.0000 mg | ORAL_TABLET | Freq: Three times a day (TID) | ORAL | Status: DC
Start: 2024-02-09 — End: 2024-02-10
  Filled 2024-02-09: qty 2

## 2024-02-09 MED ORDER — ONDANSETRON HCL 4 MG/2ML IJ SOLN
4.0000 mg | Freq: Four times a day (QID) | INTRAMUSCULAR | Status: DC | PRN
  Administered 2024-02-10: 4 mg via INTRAVENOUS
  Filled 2024-02-09: qty 2

## 2024-02-09 MED ORDER — MULTIVITAMINS PO CAPS
1.0000 | ORAL_CAPSULE | Freq: Every day | ORAL | Status: DC
  Filled 2024-02-09: qty 1

## 2024-02-09 MED ORDER — CALCITRIOL 0.25 MCG PO CAPS
0.2500 ug | ORAL_CAPSULE | Freq: Every day | ORAL | Status: DC
  Administered 2024-02-09 – 2024-02-11 (×3): 0.25 ug via ORAL
  Filled 2024-02-09 (×3): qty 1

## 2024-02-09 MED ORDER — GABAPENTIN 300 MG PO CAPS
300.0000 mg | ORAL_CAPSULE | Freq: Every day | ORAL | Status: DC
Start: 2024-02-09 — End: 2024-02-12
  Administered 2024-02-09 – 2024-02-10 (×2): 300 mg via ORAL
  Filled 2024-02-09 (×3): qty 1

## 2024-02-09 MED ORDER — LACTATED RINGERS IV BOLUS
1000.0000 mL | Freq: Once | INTRAVENOUS | Status: AC
  Administered 2024-02-09: 1000 mL via INTRAVENOUS

## 2024-02-09 MED ORDER — LOPERAMIDE HCL 2 MG PO CAPS
2.0000 mg | ORAL_CAPSULE | ORAL | Status: DC | PRN
Start: 2024-02-09 — End: 2024-02-12

## 2024-02-09 MED ORDER — TRAZODONE HCL 50 MG PO TABS
25.0000 mg | ORAL_TABLET | Freq: Every evening | ORAL | Status: DC | PRN
  Administered 2024-02-09 – 2024-02-10 (×2): 25 mg via ORAL
  Filled 2024-02-09 (×2): qty 1

## 2024-02-09 MED ORDER — HEPARIN SODIUM (PORCINE) 5000 UNIT/ML IJ SOLN
5000.0000 [IU] | Freq: Three times a day (TID) | INTRAMUSCULAR | Status: DC
  Administered 2024-02-09: 5000 [IU] via SUBCUTANEOUS
  Filled 2024-02-09: qty 1

## 2024-02-09 MED ORDER — CLOPIDOGREL BISULFATE 75 MG PO TABS
75.0000 mg | ORAL_TABLET | Freq: Every day | ORAL | Status: DC
Start: 2024-02-10 — End: 2024-02-10
  Administered 2024-02-10: 75 mg via ORAL
  Filled 2024-02-09: qty 1

## 2024-02-09 MED ORDER — VONOPRAZAN FUMARATE 10 MG PO TABS: 1.0000 | ORAL_TABLET | Freq: Every day | ORAL | Status: DC

## 2024-02-09 MED ORDER — ACETAMINOPHEN 325 MG PO TABS: 650.0000 mg | ORAL_TABLET | Freq: Four times a day (QID) | ORAL | Status: DC | PRN

## 2024-02-09 MED ORDER — LATANOPROST 0.005 % OP SOLN
1.0000 [drp] | Freq: Every day | OPHTHALMIC | Status: DC
  Administered 2024-02-09 – 2024-02-11 (×3): 1 [drp] via OPHTHALMIC
  Filled 2024-02-09 (×2): qty 2.5

## 2024-02-09 NOTE — ED Provider Notes (Signed)
 [place] EMERGENCY DEPARTMENT AT [facility] Provider Note   CSN: [id number] Arrival date & time: 02/09/24  1335     Patient presents with: No chief complaint on file.   Raymond [name] is a 63 y.o. male.   HPI Patient presents with weakness and near syncope.  Has diarrhea for around 3 weeks.  Passed out today.  Had some dialysis done but not much due to the hypotension.  Has had decreased oral intake.  Reportedly was at dry weight or less prior to dialysis.  No fevers.  No abdominal pain.   Past Medical History:  Diagnosis Date   Diabetes (HCC)    Diabetes mellitus without complication (HCC)    type 2   ESRD (end stage renal disease) (HCC)    03/09/2015- patient had a kidney transplant   Hypertension    Nausea vomiting and diarrhea 12/10/2015   Peripheral vascular disease (HCC)    Renal disorder    Renal insufficiency    Past Surgical History:  Procedure Laterality Date   ABDOMINAL AORTOGRAM W/LOWER EXTREMITY N/A 09/11/2021   Procedure: ABDOMINAL AORTOGRAM W/LOWER EXTREMITY;  Surgeon: Darron Deatrice LABOR, MD;  Location: [facility] INVASIVE CV LAB;  Service: Cardiovascular;  Laterality: N/A;   AMPUTATION Right 09/25/2021   Procedure: RIGHT TRANSMETATARSAL AMPUTATION AND APPLY TISSUE GRAFT;  Surgeon: Harden Jerona GAILS, MD;  Location: [facility] OR;  Service: Orthopedics;  Laterality: Right;   AMPUTATION Right 11/16/2021   Procedure: RIGHT AMPUTATION BELOW KNEE WITH WOUND VAC APPLICATION;  Surgeon: Harden Jerona GAILS, MD;  Location: [facility] OR;  Service: Orthopedics;  Laterality: Right;   BONE BIOPSY Right 03/06/2021   Procedure: BONE BIOPSY;  Surgeon: Gershon Donnice SAUNDERS, DPM;  Location: [facility] ORS;  Service: Podiatry;  Laterality: Right;   CENTRAL VENOUS CATHETER INSERTION Right 01/20/2020   Procedure: INSERTION CENTRAL LINE ADULT; Tunneled central line;  Surgeon: Kallie Manuelita BROCKS, MD;  Location: [facility] ORS;  Service: General;  Laterality: Right;   COLONOSCOPY N/A 12/05/2014   DOQ:fnizmjuz  external and internal hemorrhoid/mild diverticulosis/11 polyps removed   ESOPHAGOGASTRODUODENOSCOPY N/A 12/05/2014   DOQ:fpoi duodenitis   GRAFT APPLICATION Right 03/06/2021   Procedure: GRAFT APPLICATION;  Surgeon: Gershon Donnice SAUNDERS, DPM;  Location: [facility] ORS;  Service: Podiatry;  Laterality: Right;   INCISION AND DRAINAGE OF WOUND Right 08/15/2021   Procedure: IRRIGATION AND DEBRIDEMENT WOUND;  Surgeon: Burt Fus, DPM;  Location: [facility] ORS;  Service: Podiatry;  Laterality: Right;  need to make card not  a irrigation and debridement card   IR FLUORO GUIDE CV LINE RIGHT  03/01/2019   IR PERC TUN PERIT CATH WO PORT S&I /IMAG  06/27/2021   IR REMOVAL TUN CV CATH W/O FL  05/10/2019   IR REMOVAL TUN CV CATH W/O FL  02/29/2020   IR REMOVAL TUN CV CATH W/O FL  07/19/2021   IR US  GUIDE VASC ACCESS RIGHT  03/01/2019   IR US  GUIDE VASC ACCESS RIGHT  06/27/2021   IRRIGATION AND DEBRIDEMENT ELBOW Left 06/24/2021   Procedure: IRRIGATION AND DEBRIDEMENT ELBOW;  Surgeon: Leora Lynwood SAUNDERS, MD;  Location: [facility] ORS;  Service: Orthopedics;  Laterality: Left;   KIDNEY TRANSPLANT  03/27/2015   KNEE ARTHROTOMY Right 06/08/2023   Procedure: KNEE ARTHROTOMY;  Surgeon: Onesimo Oneil LABOR, MD;  Location: [facility] ORS;  Service: Orthopedics;  Laterality: Right;   Penile Pump Insertion     PERIPHERAL VASCULAR ATHERECTOMY  09/11/2021   Procedure: PERIPHERAL VASCULAR ATHERECTOMY;  Surgeon: Darron Deatrice LABOR, MD;  Location: [facility]  INVASIVE CV LAB;  Service: Cardiovascular;;  Shockwave Lithotripsy   TEE WITHOUT CARDIOVERSION N/A 01/17/2020   Procedure: TRANSESOPHAGEAL ECHOCARDIOGRAM (TEE) WITH PROPOFOL ;  Surgeon: Alvan Dorn FALCON, MD;  Location: [facility] ENDO SUITE;  Service: Endoscopy;  Laterality: N/A;   WOUND DEBRIDEMENT Right 03/06/2021   Procedure: DEBRIDEMENT WOUND;  Surgeon: Gershon Donnice SAUNDERS, DPM;  Location: [facility] ORS;  Service: Podiatry;  Laterality: Right;     Prior to Admission medications   Medication Sig Start Date End Date Taking?  Authorizing Provider  acetaminophen  (TYLENOL ) 500 MG tablet Take 2 tablets (1,000 mg total) by mouth every 8 (eight) hours as needed. Patient taking differently: Take 1,000 mg by mouth every 6 (six) hours as needed for mild pain (pain score 1-3). 02/04/24 02/03/25 Yes Georgina Speaks, FNP  atorvastatin  (LIPITOR) 10 MG tablet Take 1 tablet by mouth once daily 06/19/23  Yes Court Dorn PARAS, MD  Azathioprine  75 MG TABS Take 150 mg by mouth daily.   Yes [provider]  cloNIDine  (CATAPRES  - DOSED IN MG/24 HR) 0.3 mg/24hr patch Place 1 patch (0.3 mg total) onto the skin once a week. Patient taking differently: Place 0.3 mg onto the skin every Sunday. 08/21/21  Yes Court Dorn PARAS, MD  clopidogrel  (PLAVIX ) 75 MG tablet Take 75 mg by mouth daily. 04/09/23  Yes [provider]  doxazosin  (CARDURA ) 4 MG tablet Take 4 mg by mouth 2 (two) times daily. 02/02/23  Yes [provider]  Dulaglutide  (TRULICITY ) 3 MG/0.5ML SOAJ Inject 3 mg as directed once a week. Patient taking differently: Inject 3 mg as directed every Sunday. 09/07/23  Yes Thapa, [place], MD  furosemide  (LASIX ) 80 MG tablet Take 80 mg by mouth 2 (two) times daily as needed for fluid or edema. 11/02/23  Yes [provider]  gabapentin  (NEURONTIN ) 300 MG capsule Take 300 mg by mouth at bedtime.   Yes [provider]  hydrALAZINE  (APRESOLINE ) 25 MG tablet Take 2 tablets (50 mg total) by mouth 3 (three) times daily. 06/12/23  Yes Johnson, Clanford L, MD  insulin  lispro (HUMALOG  KWIKPEN) 100 UNIT/ML KwikPen Inject 5-10 Units into the skin 3 (three) times daily. Maximum 30 units/day. 02/04/24  Yes Thapa, [place], MD  latanoprost  (XALATAN ) 0.005 % ophthalmic solution Place 1 drop into both eyes at bedtime. 07/25/21  Yes [provider]  loperamide  (IMODIUM ) 2 MG capsule Take 2 mg by mouth as needed for diarrhea or loose stools.   Yes [provider]  Multiple Vitamin (MULTIVITAMIN) capsule Take 1 capsule  by mouth daily.   Yes [provider]  omeprazole  (PRILOSEC) 40 MG capsule Take 40 mg by mouth every morning. 03/31/23  Yes [provider]  oxyCODONE -acetaminophen  (PERCOCET/ROXICET) 5-325 MG tablet Take 1 tablet by mouth every 8 (eight) hours as needed for severe pain (pain score 7-10). 02/04/24  Yes Georgina Speaks, FNP  predniSONE  (DELTASONE ) 5 MG tablet Take 1 tablet (5 mg total) by mouth daily with breakfast. 02/04/24  Yes Georgina Speaks, FNP  sevelamer  carbonate (RENVELA ) 800 MG tablet Take 800 mg by mouth 3 (three) times daily with meals. 12/03/23 12/02/24 Yes [provider]  tacrolimus  (PROGRAF ) 1 MG capsule Take 3 mg by mouth 2 (two) times daily.   Yes [provider]  Vitamin D , Ergocalciferol , (DRISDOL ) 50000 units CAPS capsule Take 50,000 Units by mouth every 30 (thirty) days. 15th of the month   Yes [provider]  Vonoprazan Fumarate  (VOQUEZNA ) 10 MG TABS Take 1 tablet by mouth daily. 12/23/23  Yes [provider]  blood glucose meter kit and supplies KIT Dispense based on patient and insurance preference. Use up to four times daily as directed. (FOR ICD-9 250.00, 250.01). 04/11/20   [name], Gebreselassie W, MD  Blood Glucose Monitoring Suppl (ACCU-CHEK GUIDE ME) w/Device KIT 1 Piece by Does not apply route as directed. 02/18/18   [name], Gebreselassie W, MD  calcitRIOL  (ROCALTROL ) 0.25 MCG capsule Take 0.25 mcg by mouth. 10/21/23   [provider]  Continuous Blood Gluc Receiver (DEXCOM G7 RECEIVER) DEVI Use to check blood sugar 3 times a day. Dx code e11.65 12/16/21   Georgina Speaks, FNP  Continuous Glucose Sensor (DEXCOM G7 SENSOR) MISC USE TO CHECK GLUCOSE THREE TIMES DAILY AS DIRECTED, CHANGE SENSOR EVERY 10 DAYS. 09/09/23   Thapa, [place], MD  glucose blood (ACCU-CHEK GUIDE) test strip Use as instructed 4 x daily. E11.65 02/19/18   Lenis Ethelle ORN, MD    Allergies: Bactrim  [sulfamethoxazole -trimethoprim ], Doxycycline , Flagyl   [metronidazole ], and Vancomycin     Review of Systems  Updated Vital Signs BP 105/72   Pulse 72   Temp 97.9 F (36.6 C) (Oral)   Resp 14   Ht 5' 7 (1.702 m)   Wt 94 kg   SpO2 94%   BMI 32.46 kg/m   Physical Exam Vitals and nursing note reviewed.  HENT:     Head: Normocephalic.  Cardiovascular:     Rate and Rhythm: Bradycardia present.  Pulmonary:     Breath sounds: No rhonchi.  Abdominal:     Tenderness: There is no abdominal tenderness.  Musculoskeletal:     Left lower leg: Edema present.     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  Neurological:     Mental Status: He is alert and oriented to person, place, and time.     (all labs ordered are listed, but only abnormal results are displayed) Labs Reviewed  COMPREHENSIVE METABOLIC PANEL WITH GFR - Abnormal; Notable for the following components:      Result Value   Chloride 97 (*)    CO2 20 (*)    Glucose, Bld 244 (*)    BUN 32 (*)    Creatinine, Ser 5.54 (*)    Calcium  8.4 (*)    Total Protein 8.3 (*)    Albumin  2.3 (*)    GFR, Estimated 11 (*)    Anion gap 20 (*)    All other components within normal limits  CBC WITH DIFFERENTIAL/PLATELET - Abnormal; Notable for the following components:   RBC 3.23 (*)    Hemoglobin 7.5 (*)    HCT 29.3 (*)    MCH 23.2 (*)    MCHC 25.6 (*)    RDW 16.9 (*)    All other components within normal limits  LACTIC ACID, PLASMA - Abnormal; Notable for the following components:   Lactic Acid, Venous 4.7 (*)    All other components within normal limits  LACTIC ACID, PLASMA - Abnormal; Notable for the following components:   Lactic Acid, Venous 3.6 (*)    All other components within normal limits  PHOSPHORUS - Abnormal; Notable for the following components:   Phosphorus 5.3 (*)    All other components within normal limits  I-STAT CHEM 8, ED - Abnormal; Notable for the following components:   BUN 38 (*)    Creatinine, Ser 5.70 (*)    Glucose, Bld  260 (*)    Calcium , Ion 0.87 (*)    Hemoglobin 8.8 (*)  HCT 26.0 (*)    All other components within normal limits  TROPONIN I (HIGH SENSITIVITY) - Abnormal; Notable for the following components:   Troponin I (High Sensitivity) 3,329 (*)    All other components within normal limits  TROPONIN I (HIGH SENSITIVITY) - Abnormal; Notable for the following components:   Troponin I (High Sensitivity) 3,624 (*)    All other components within normal limits  CULTURE, BLOOD (ROUTINE X 2)  CULTURE, BLOOD (ROUTINE X 2)  MAGNESIUM   URINALYSIS, ROUTINE W REFLEX MICROSCOPIC  TACROLIMUS  LEVEL  BASIC METABOLIC PANEL WITH GFR  CBC    EKG: EKG Interpretation Date/Time:  Tuesday February 09 2024 16:48:30 EDT Ventricular Rate:  74 PR Interval:  179 QRS Duration:  108 QT Interval:  422 QTC Calculation: 469 R Axis:   82  Text Interpretation: Sinus rhythm Borderline right axis deviation Nonspecific repol abnormality, lateral leads  back in sinus rhythm.   ST changes improved  overall. Confirmed by Patsey Lot [phone]) on 02/09/2024 4:59:03 PM  Radiology: DG Chest Portable 1 View Result Date: 02/09/2024 CLINICAL DATA:  Weakness. EXAM: PORTABLE CHEST 1 VIEW COMPARISON:  09/01/2023. FINDINGS: Stable cardiomegaly. No overt pulmonary edema, focal consolidation, pleural effusion, or pneumothorax. Left axillary vascular stent. No acute osseous abnormality. IMPRESSION: 1. No acute cardiopulmonary findings. 2. Stable cardiomegaly. Electronically Signed   By: Harrietta Sherry M.D.   On: 02/09/2024 16:04     Procedures   Medications Ordered in the ED  oxyCODONE -acetaminophen  (PERCOCET/ROXICET) 5-325 MG per tablet 1 tablet (has no administration in time range)  atorvastatin  (LIPITOR) tablet 10 mg (has no administration in time range)  cloNIDine  (CATAPRES  - Dosed in mg/24 hr) patch 0.3 mg (has no administration in time range)  doxazosin  (CARDURA ) tablet 4 mg (4 mg Oral Not Given 02/09/24 2132)  furosemide   (LASIX ) tablet 80 mg (has no administration in time range)  hydrALAZINE  (APRESOLINE ) tablet 50 mg (50 mg Oral Not Given 02/09/24 2133)  calcitRIOL  (ROCALTROL ) capsule 0.25 mcg (0.25 mcg Oral Given 02/09/24 2142)  loperamide  (IMODIUM ) capsule 2 mg (has no administration in time range)  pantoprazole  (PROTONIX ) EC tablet 40 mg (has no administration in time range)  sevelamer  carbonate (RENVELA ) tablet 800 mg (has no administration in time range)  Vonoprazan Fumarate  TABS 1 tablet (has no administration in time range)  clopidogrel  (PLAVIX ) tablet 75 mg (has no administration in time range)  Azathioprine  TABS 150 mg (has no administration in time range)  tacrolimus  (PROGRAF ) capsule 3 mg (3 mg Oral Given 02/09/24 2200)  gabapentin  (NEURONTIN ) capsule 300 mg (300 mg Oral Given 02/09/24 2143)  multivitamin capsule 1 capsule (has no administration in time range)  Vitamin D  (Ergocalciferol ) (DRISDOL ) 1.25 MG (50000 UNIT) capsule 50,000 Units (50,000 Units Oral Not Given 02/09/24 2133)  latanoprost  (XALATAN ) 0.005 % ophthalmic solution 1 drop (1 drop Both Eyes Given 02/09/24 2143)  sodium chloride  flush (NS) 0.9 % injection 3 mL (3 mLs Intravenous Given 02/09/24 2145)  heparin  injection 5,000 Units (5,000 Units Subcutaneous Given 02/09/24 2142)  acetaminophen  (TYLENOL ) tablet 650 mg (has no administration in time range)    Or  acetaminophen  (TYLENOL ) suppository 650 mg (has no administration in time range)  traZODone  (DESYREL ) tablet 25 mg (25 mg Oral Given 02/09/24 2142)  magnesium  hydroxide (MILK OF MAGNESIA) suspension 30 mL (has no administration in time range)  ondansetron  (ZOFRAN ) tablet 4 mg (has no administration in time range)    Or  ondansetron  (ZOFRAN ) injection 4 mg (has no administration in time range)  lactated ringers  bolus 1,000 mL (0 mLs Intravenous Stopped 02/09/24 1610)  lactated ringers  bolus 1,000 mL (0 mLs Intravenous Stopped 02/09/24 1820)                                    Medical  Decision Making Amount and/or Complexity of Data Reviewed Labs: ordered. Radiology: ordered.  Risk Decision regarding hospitalization.   Patient with diarrhea.  Hypotension with syncope.  Initial EKG is bradycardic with some nonspecific ST changes.  Differential diagnose includes hypotension.  Patient is not having any chest pain so doubt STEMI. Initial EKGs did however show third-degree heart block.  Did have a relatively narrow escape rhythm however.  Once blood pressures come up now back in sinus rhythm.   Differential diagnosis does also include hyperkalemia.  However i-STAT Chem-8 does not show this.  IV access has been obtained and fluid bolus given.  Initial blood pressure measured at 50/30 although on recheck it was 70/40.  Now 88/74.  Fluids going.  Sepsis considered but without fevers and I think more likely volume down.  Will get lactic acid.  Initial lactic acid elevated.  I think likely secondary to dehydration.  Benign abdominal exam.   Troponin has come back elevated.  Discussed with Dr. Santo from cardiology.  Recommends transferring down to [facility].  Blood pressure now seems more stable at around 115.  Did have measurement that was in the 60s but he was laying on his side.  When he was rolled on his back and rechecked it was not in the 60s.  CRITICAL CARE Performed by: [place] Total critical care time: 45 minutes Critical care time was exclusive of separately billable procedures and treating other patients. Critical care was necessary to treat or prevent imminent or life-threatening deterioration. Critical care was time spent personally by me on the following activities: development of treatment plan with patient and/or surrogate as well as nursing, discussions with consultants, evaluation of patient's response to treatment, examination of patient, obtaining history from patient or surrogate, ordering and performing treatments and  interventions, ordering and review of laboratory studies, ordering and review of radiographic studies, pulse oximetry and re-evaluation of patient's condition.  Repeat lactic acid down to 3.6.  Blood pressure is maintained now above 100.  Discussed with hospitalist, who will admit.    Final diagnoses:  End stage renal disease on dialysis [facility])  Dehydration  Complete heart block [facility])    ED Discharge Orders     None          River Rankin, MD 02/09/24 2315

## 2024-02-09 NOTE — H&P (Signed)
 [place]   PATIENT NAME: Raymond [name]    MR#:  [id number]  DATE OF BIRTH:  Oct 23, 1960  DATE OF ADMISSION:  02/09/2024  PRIMARY CARE PHYSICIAN: Georgina Speaks, FNP   Patient is coming from: Home  REQUESTING/REFERRING PHYSICIAN: Patsey Lot, MD  CHIEF COMPLAINT:  Hypotension and loss of consciousness  HISTORY OF PRESENT ILLNESS:  Raymond [name] is a 63 y.o. African-American male with medical history significant for type 2 diabetes mellitus, essential hypertension, end-stage renal disease on hemodialysis, who has been having diarrhea over the last few weeks and today experienced syncope in his dialysis center and therefore he did not have it.  He was noted to have hypotension with blood pressure in the 50s.  He denies any chest pain or palpitations.  No cough or wheezing or dyspnea.  No nausea or vomiting or abdominal pain.  He has been having dizziness and generalized weakness.  No paresthesias or focal muscle weakness.  No vertigo or tinnitus.  No dysuria, oliguria or hematuria or flank pain.  ED Course: When the patient came to the ER, BP was 151/39 with otherwise normal vital signs.  Labs revealed CO2 of 20 and blood Kos of 244 chloride 97 with a BUN of 32 and creatinine 5.54 calcium  8.4 with anion gap of 20.  Albumin  was 2.3 total protein was 8.3.  High-sensitivity troponin I was 3329 and later 3624. EKG as reviewed by me : EKG showed sinus bradycardia with a rate of 52 with right axis deviation lateral ST segment depression with T wave inversion in 1 mm ST segment elevation in lead III. Repeat EKG showed normal sinus rhythm with a rate of 74 and borderline right axis deviation . Imaging: Portable chest x-ray showed cardiomegaly and no acute cardiopulmonary disease.  The patient was given 2 L bolus of IV lactated ringer .  The patient will be admitted to a cardiac telemetry observation bed at [facility] for further evaluation management.  I discussed the case with Dr.  Duffy who will see the patient upon arrival.  I also notified Dr. Melia with nephrology. PAST MEDICAL HISTORY:   Past Medical History:  Diagnosis Date   Diabetes (HCC)    Diabetes mellitus without complication (HCC)    type 2   ESRD (end stage renal disease) (HCC)    03/09/2015- patient had a kidney transplant   Hypertension    Nausea vomiting and diarrhea 12/10/2015   Peripheral vascular disease (HCC)    Renal disorder    Renal insufficiency     PAST SURGICAL HISTORY:   Past Surgical History:  Procedure Laterality Date   ABDOMINAL AORTOGRAM W/LOWER EXTREMITY N/A 09/11/2021   Procedure: ABDOMINAL AORTOGRAM W/LOWER EXTREMITY;  Surgeon: Darron Deatrice LABOR, MD;  Location: [facility] INVASIVE CV LAB;  Service: Cardiovascular;  Laterality: N/A;   AMPUTATION Right 09/25/2021   Procedure: RIGHT TRANSMETATARSAL AMPUTATION AND APPLY TISSUE GRAFT;  Surgeon: Harden Jerona GAILS, MD;  Location: [facility] OR;  Service: Orthopedics;  Laterality: Right;   AMPUTATION Right 11/16/2021   Procedure: RIGHT AMPUTATION BELOW KNEE WITH WOUND VAC APPLICATION;  Surgeon: Harden Jerona GAILS, MD;  Location: [facility] OR;  Service: Orthopedics;  Laterality: Right;   BONE BIOPSY Right 03/06/2021   Procedure: BONE BIOPSY;  Surgeon: Gershon Donnice SAUNDERS, DPM;  Location: [facility] ORS;  Service: Podiatry;  Laterality: Right;   CENTRAL VENOUS CATHETER INSERTION Right 01/20/2020   Procedure: INSERTION CENTRAL LINE ADULT; Tunneled central line;  Surgeon: Kallie Manuelita BROCKS, MD;  Location: [facility]  ORS;  Service: General;  Laterality: Right;   COLONOSCOPY N/A 12/05/2014   DOQ:fnizmjuz external and internal hemorrhoid/mild diverticulosis/11 polyps removed   ESOPHAGOGASTRODUODENOSCOPY N/A 12/05/2014   DOQ:fpoi duodenitis   GRAFT APPLICATION Right 03/06/2021   Procedure: GRAFT APPLICATION;  Surgeon: Gershon Donnice SAUNDERS, DPM;  Location: [facility] ORS;  Service: Podiatry;  Laterality: Right;   INCISION AND DRAINAGE OF WOUND Right 08/15/2021   Procedure: IRRIGATION AND DEBRIDEMENT  WOUND;  Surgeon: Burt Fus, DPM;  Location: [facility] ORS;  Service: Podiatry;  Laterality: Right;  need to make card not  a irrigation and debridement card   IR FLUORO GUIDE CV LINE RIGHT  03/01/2019   IR PERC TUN PERIT CATH WO PORT S&I /IMAG  06/27/2021   IR REMOVAL TUN CV CATH W/O FL  05/10/2019   IR REMOVAL TUN CV CATH W/O FL  02/29/2020   IR REMOVAL TUN CV CATH W/O FL  07/19/2021   IR US  GUIDE VASC ACCESS RIGHT  03/01/2019   IR US  GUIDE VASC ACCESS RIGHT  06/27/2021   IRRIGATION AND DEBRIDEMENT ELBOW Left 06/24/2021   Procedure: IRRIGATION AND DEBRIDEMENT ELBOW;  Surgeon: Leora Lynwood SAUNDERS, MD;  Location: [facility] ORS;  Service: Orthopedics;  Laterality: Left;   KIDNEY TRANSPLANT  03/27/2015   KNEE ARTHROTOMY Right 06/08/2023   Procedure: KNEE ARTHROTOMY;  Surgeon: Onesimo Oneil LABOR, MD;  Location: [facility] ORS;  Service: Orthopedics;  Laterality: Right;   Penile Pump Insertion     PERIPHERAL VASCULAR ATHERECTOMY  09/11/2021   Procedure: PERIPHERAL VASCULAR ATHERECTOMY;  Surgeon: Darron Deatrice LABOR, MD;  Location: [facility] INVASIVE CV LAB;  Service: Cardiovascular;;  Shockwave Lithotripsy   TEE WITHOUT CARDIOVERSION N/A 01/17/2020   Procedure: TRANSESOPHAGEAL ECHOCARDIOGRAM (TEE) WITH PROPOFOL ;  Surgeon: Alvan Dorn FALCON, MD;  Location: [facility] ENDO SUITE;  Service: Endoscopy;  Laterality: N/A;   WOUND DEBRIDEMENT Right 03/06/2021   Procedure: DEBRIDEMENT WOUND;  Surgeon: Gershon Donnice SAUNDERS, DPM;  Location: [facility] ORS;  Service: Podiatry;  Laterality: Right;    SOCIAL HISTORY:   Social History   Tobacco Use   Smoking status: Never   Smokeless tobacco: Never  Substance Use Topics   Alcohol use: No    Alcohol/week: 0.0 standard drinks of alcohol    FAMILY HISTORY:   Family History  Problem Relation Age of Onset   Diabetes Mother    Heart disease Father    Colon cancer Neg Hx     DRUG ALLERGIES:   Allergies  Allergen Reactions   Bactrim  [Sulfamethoxazole -Trimethoprim ] Other (See Comments)    Runs pt's  sugar and phosphorus   Doxycycline  Other (See Comments)    Runs up pt's blood sugar and phosphorus   Flagyl  [Metronidazole ] Hives and Rash    Patient having hives to either the vancomycin  or flagyl  (both infusing together).   Vancomycin  Hives and Rash    Patient having hives to either vancomycin  or flagyl     REVIEW OF SYSTEMS:   ROS As per history of present illness. All pertinent systems were reviewed above. Constitutional, HEENT, cardiovascular, respiratory, GI, GU, musculoskeletal, neuro, psychiatric, endocrine, integumentary and hematologic systems were reviewed and are otherwise negative/unremarkable except for positive findings mentioned above in the HPI.   MEDICATIONS AT HOME:   Prior to Admission medications   Medication Sig Start Date End Date Taking? Authorizing Provider  acetaminophen  (TYLENOL ) 500 MG tablet Take 2 tablets (1,000 mg total) by mouth every 8 (eight) hours as needed. Patient taking differently: Take 1,000 mg by mouth every 6 (six) hours as  needed for mild pain (pain score 1-3). 02/04/24 02/03/25 Yes Georgina Speaks, FNP  atorvastatin  (LIPITOR) 10 MG tablet Take 1 tablet by mouth once daily 06/19/23  Yes Court Dorn PARAS, MD  Azathioprine  75 MG TABS Take 150 mg by mouth daily.   Yes [provider]  cloNIDine  (CATAPRES  - DOSED IN MG/24 HR) 0.3 mg/24hr patch Place 1 patch (0.3 mg total) onto the skin once a week. Patient taking differently: Place 0.3 mg onto the skin every Sunday. 08/21/21  Yes Court Dorn PARAS, MD  clopidogrel  (PLAVIX ) 75 MG tablet Take 75 mg by mouth daily. 04/09/23  Yes [provider]  doxazosin  (CARDURA ) 4 MG tablet Take 4 mg by mouth 2 (two) times daily. 02/02/23  Yes [provider]  Dulaglutide  (TRULICITY ) 3 MG/0.5ML SOAJ Inject 3 mg as directed once a week. Patient taking differently: Inject 3 mg as directed every Sunday. 09/07/23  Yes Thapa, [place], MD  furosemide  (LASIX ) 80 MG tablet Take 80 mg by mouth 2 (two) times  daily as needed for fluid or edema. 11/02/23  Yes [provider]  gabapentin  (NEURONTIN ) 300 MG capsule Take 300 mg by mouth at bedtime.   Yes [provider]  hydrALAZINE  (APRESOLINE ) 25 MG tablet Take 2 tablets (50 mg total) by mouth 3 (three) times daily. 06/12/23  Yes Johnson, Clanford L, MD  insulin  lispro (HUMALOG  KWIKPEN) 100 UNIT/ML KwikPen Inject 5-10 Units into the skin 3 (three) times daily. Maximum 30 units/day. 02/04/24  Yes Thapa, [place], MD  latanoprost  (XALATAN ) 0.005 % ophthalmic solution Place 1 drop into both eyes at bedtime. 07/25/21  Yes [provider]  loperamide  (IMODIUM ) 2 MG capsule Take 2 mg by mouth as needed for diarrhea or loose stools.   Yes [provider]  Multiple Vitamin (MULTIVITAMIN) capsule Take 1 capsule by mouth daily.   Yes [provider]  omeprazole  (PRILOSEC) 40 MG capsule Take 40 mg by mouth every morning. 03/31/23  Yes [provider]  oxyCODONE -acetaminophen  (PERCOCET/ROXICET) 5-325 MG tablet Take 1 tablet by mouth every 8 (eight) hours as needed for severe pain (pain score 7-10). 02/04/24  Yes Georgina Speaks, FNP  predniSONE  (DELTASONE ) 5 MG tablet Take 1 tablet (5 mg total) by mouth daily with breakfast. 02/04/24  Yes Georgina Speaks, FNP  sevelamer  carbonate (RENVELA ) 800 MG tablet Take 800 mg by mouth 3 (three) times daily with meals. 12/03/23 12/02/24 Yes [provider]  tacrolimus  (PROGRAF ) 1 MG capsule Take 3 mg by mouth 2 (two) times daily.   Yes [provider]  Vitamin D , Ergocalciferol , (DRISDOL ) 50000 units CAPS capsule Take 50,000 Units by mouth every 30 (thirty) days. 15th of the month   Yes [provider]  Vonoprazan Fumarate  (VOQUEZNA ) 10 MG TABS Take 1 tablet by mouth daily. 12/23/23  Yes [provider]  blood glucose meter kit and supplies KIT Dispense based on patient and insurance preference. Use up to four times daily as directed. (FOR ICD-9 250.00, 250.01).  04/11/20   [name], Gebreselassie W, MD  Blood Glucose Monitoring Suppl (ACCU-CHEK GUIDE ME) w/Device KIT 1 Piece by Does not apply route as directed. 02/18/18   [name], Gebreselassie W, MD  calcitRIOL  (ROCALTROL ) 0.25 MCG capsule Take 0.25 mcg by mouth. 10/21/23   [provider]  Continuous Blood Gluc Receiver (DEXCOM G7 RECEIVER) DEVI Use to check blood sugar 3 times a day. Dx code e11.65 12/16/21   Georgina Speaks, FNP  Continuous Glucose Sensor (DEXCOM G7 SENSOR) MISC USE TO CHECK  GLUCOSE THREE TIMES DAILY AS DIRECTED, CHANGE SENSOR EVERY 10 DAYS. 09/09/23   Thapa, [place], MD  glucose blood (ACCU-CHEK GUIDE) test strip Use as instructed 4 x daily. E11.65 02/19/18   Lenis Ethelle ORN, MD      VITAL SIGNS:  Blood pressure (!) 144/111, pulse 69, temperature 97.9 F (36.6 C), temperature source Oral, resp. rate 17, height 5' 7 (1.702 m), weight 94 kg, SpO2 90%.  PHYSICAL EXAMINATION:  Physical Exam  GENERAL:  63 y.o.-year-old African-American male patient lying in the bed with no acute distress.  EYES: Pupils equal, round, reactive to light and accommodation. No scleral icterus. Extraocular muscles intact.  HEENT: Head atraumatic, normocephalic. Oropharynx and nasopharynx clear.  NECK:  Supple, no jugular venous distention. No thyroid enlargement, no tenderness.  LUNGS: Normal breath sounds bilaterally, no wheezing, rales,  rhonchi or crepitation. No use of accessory muscles of respiration.  CARDIOVASCULAR: Regular rate and rhythm, S1, S2 normal. No murmurs, rubs, or gallops.  ABDOMEN: Soft, nondistended, nontender. Bowel sounds present. No organomegaly or mass.  EXTREMITIES: No pedal edema, cyanosis, or clubbing.  The patient is status post right AKA. NEUROLOGIC: Cranial nerves II through XII are intact. Muscle strength 5/5 in all extremities. Sensation intact. Gait not checked.  PSYCHIATRIC: The patient is alert and oriented x 3.  Normal affect and good eye contact. SKIN: No obvious  rash, lesion, or ulcer.   LABORATORY PANEL:   CBC Recent Labs  Lab 02/09/24 1510  WBC 8.6  HGB 7.5*  HCT 29.3*  PLT 273   ------------------------------------------------------------------------------------------------------------------  Chemistries  Recent Labs  Lab 02/09/24 1510  NA 137  K 3.7  CL 97*  CO2 20*  GLUCOSE 244*  BUN 32*  CREATININE 5.54*  CALCIUM  8.4*  MG 2.1  AST 33  ALT 13  ALKPHOS 125  BILITOT 0.8   ------------------------------------------------------------------------------------------------------------------  Cardiac Enzymes No results for input(s): TROPONINI in the last 168 hours. ------------------------------------------------------------------------------------------------------------------  RADIOLOGY:  DG Chest Portable 1 View Result Date: 02/09/2024 CLINICAL DATA:  Weakness. EXAM: PORTABLE CHEST 1 VIEW COMPARISON:  09/01/2023. FINDINGS: Stable cardiomegaly. No overt pulmonary edema, focal consolidation, pleural effusion, or pneumothorax. Left axillary vascular stent. No acute osseous abnormality. IMPRESSION: 1. No acute cardiopulmonary findings. 2. Stable cardiomegaly. Electronically Signed   By: Harrietta Sherry M.D.   On: 02/09/2024 16:04      IMPRESSION AND PLAN:  Assessment and Plan: * Syncope - The patient will be admitted to an observation medically monitored bed at [facility]. - Will check orthostatics q 12 hours. - Will for more serious cardiac enzymes. -This could be related to non-STEMI and significantly elevated troponins.. - The patient will be monitored for arrhythmias. -Differential diagnoses would include cardiogenic, orthostatic hypotension, neurally mediated syncope, arrhythmias related, and less likely hypoglycemia.    NSTEMI (non-ST elevated myocardial infarction) (HCC) - Though the patient did not have chest pain however and suspected third-degree AV block. - Will place him on IV heparin  and high-dose statin. -  Will follow serial troponins. - 2D echo was ordered. - Cardiology consult will be needed upon arrival to [facility]. - The case was discussed with Dr.Chandrasekhar from cardiology at [facility].  Type 2 diabetes mellitus with peripheral neuropathy (HCC) - The patient will be placed on supplement coverage with NovoLog . - Will continue Neurontin   ESRD on hemodialysis [facility]) - Nephrology consult will be needed for dialysis. - The patient is status post renal transplant. - Will continue Prograf , and Imuran .   DVT prophylaxis: IV heparin  [facility]  Planning:  Code Status: full code. Family Communication:  The plan of care was discussed in details with the patient (and family). I answered all questions. The patient agreed to proceed with the above mentioned plan. Further management will depend upon hospital course. Disposition Plan: Back to previous home environment Consults called: Cardiology and nephrology consults will need to be called upon arrival. All the records are reviewed and case discussed with ED provider.  Status is: Observation  At the time of the admission, it appears that the appropriate admission status for this patient is inpatient.  This is judged to be reasonable and necessary in order to provide the required intensity of service to ensure the patient's safety given the presenting symptoms, physical exam findings and initial radiographic and laboratory data in the context of comorbid conditions.  The patient requires inpatient status due to high intensity of service, high risk of further deterioration and high frequency of surveillance required.  I certify that at the time of admission, it is my clinical judgment that the patient will require hospital care extending more less 2 midnights.                            Dispo: The patient is from: Home              Anticipated d/c is to: Home              Patient currently is not medically stable to d/c.              Difficult to place  patient: No  Madison DELENA Peaches M.D on 02/10/2024 at 2:22 AM  [facility]   From 7 PM-7 AM, contact night-coverage [email or web address]  CC: Primary care physician; Georgina Speaks, FNP

## 2024-02-09 NOTE — ED Triage Notes (Signed)
 Pt in by pov w/ wife for weakness that started today. Pt states he has had diarrhea for 3 weeks. Wife reports pt has passed out multiple times today. Pt had dialysis today and was not able to complete his full tx d/t hypotension.

## 2024-02-10 ENCOUNTER — Inpatient Hospital Stay (HOSPITAL_COMMUNITY)

## 2024-02-10 DIAGNOSIS — Z66 Do not resuscitate: Secondary | ICD-10-CM | POA: Diagnosis present

## 2024-02-10 DIAGNOSIS — I739 Peripheral vascular disease, unspecified: Secondary | ICD-10-CM

## 2024-02-10 DIAGNOSIS — E1142 Type 2 diabetes mellitus with diabetic polyneuropathy: Secondary | ICD-10-CM | POA: Insufficient documentation

## 2024-02-10 DIAGNOSIS — R55 Syncope and collapse: Secondary | ICD-10-CM | POA: Diagnosis present

## 2024-02-10 DIAGNOSIS — D631 Anemia in chronic kidney disease: Secondary | ICD-10-CM | POA: Diagnosis present

## 2024-02-10 DIAGNOSIS — Z7401 Bed confinement status: Secondary | ICD-10-CM | POA: Diagnosis not present

## 2024-02-10 DIAGNOSIS — I132 Hypertensive heart and chronic kidney disease with heart failure and with stage 5 chronic kidney disease, or end stage renal disease: Secondary | ICD-10-CM | POA: Diagnosis present

## 2024-02-10 DIAGNOSIS — E1042 Type 1 diabetes mellitus with diabetic polyneuropathy: Secondary | ICD-10-CM | POA: Diagnosis present

## 2024-02-10 DIAGNOSIS — E872 Acidosis, unspecified: Secondary | ICD-10-CM | POA: Diagnosis present

## 2024-02-10 DIAGNOSIS — R579 Shock, unspecified: Secondary | ICD-10-CM

## 2024-02-10 DIAGNOSIS — R079 Chest pain, unspecified: Secondary | ICD-10-CM | POA: Diagnosis not present

## 2024-02-10 DIAGNOSIS — T82858A Stenosis of vascular prosthetic devices, implants and grafts, initial encounter: Secondary | ICD-10-CM | POA: Diagnosis not present

## 2024-02-10 DIAGNOSIS — R7989 Other specified abnormal findings of blood chemistry: Secondary | ICD-10-CM

## 2024-02-10 DIAGNOSIS — Z794 Long term (current) use of insulin: Secondary | ICD-10-CM | POA: Diagnosis not present

## 2024-02-10 DIAGNOSIS — Y713 Surgical instruments, materials and cardiovascular devices (including sutures) associated with adverse incidents: Secondary | ICD-10-CM | POA: Diagnosis not present

## 2024-02-10 DIAGNOSIS — I5021 Acute systolic (congestive) heart failure: Secondary | ICD-10-CM | POA: Diagnosis present

## 2024-02-10 DIAGNOSIS — N2581 Secondary hyperparathyroidism of renal origin: Secondary | ICD-10-CM | POA: Diagnosis present

## 2024-02-10 DIAGNOSIS — Z6832 Body mass index (BMI) 32.0-32.9, adult: Secondary | ICD-10-CM | POA: Diagnosis not present

## 2024-02-10 DIAGNOSIS — G9341 Metabolic encephalopathy: Secondary | ICD-10-CM | POA: Diagnosis present

## 2024-02-10 DIAGNOSIS — N186 End stage renal disease: Secondary | ICD-10-CM | POA: Diagnosis present

## 2024-02-10 DIAGNOSIS — I10 Essential (primary) hypertension: Secondary | ICD-10-CM | POA: Diagnosis not present

## 2024-02-10 DIAGNOSIS — I214 Non-ST elevation (NSTEMI) myocardial infarction: Secondary | ICD-10-CM | POA: Diagnosis present

## 2024-02-10 DIAGNOSIS — E860 Dehydration: Secondary | ICD-10-CM | POA: Diagnosis present

## 2024-02-10 DIAGNOSIS — Z515 Encounter for palliative care: Secondary | ICD-10-CM | POA: Diagnosis not present

## 2024-02-10 DIAGNOSIS — R001 Bradycardia, unspecified: Secondary | ICD-10-CM

## 2024-02-10 DIAGNOSIS — T8612 Kidney transplant failure: Secondary | ICD-10-CM | POA: Diagnosis present

## 2024-02-10 DIAGNOSIS — D649 Anemia, unspecified: Secondary | ICD-10-CM | POA: Diagnosis not present

## 2024-02-10 DIAGNOSIS — I2609 Other pulmonary embolism with acute cor pulmonale: Secondary | ICD-10-CM | POA: Diagnosis not present

## 2024-02-10 DIAGNOSIS — R570 Cardiogenic shock: Secondary | ICD-10-CM | POA: Diagnosis present

## 2024-02-10 DIAGNOSIS — E103511 Type 1 diabetes mellitus with proliferative diabetic retinopathy with macular edema, right eye: Secondary | ICD-10-CM | POA: Diagnosis present

## 2024-02-10 DIAGNOSIS — D84821 Immunodeficiency due to drugs: Secondary | ICD-10-CM | POA: Diagnosis present

## 2024-02-10 DIAGNOSIS — Z89512 Acquired absence of left leg below knee: Secondary | ICD-10-CM | POA: Diagnosis not present

## 2024-02-10 DIAGNOSIS — I959 Hypotension, unspecified: Secondary | ICD-10-CM

## 2024-02-10 DIAGNOSIS — Z992 Dependence on renal dialysis: Secondary | ICD-10-CM | POA: Diagnosis not present

## 2024-02-10 DIAGNOSIS — I4430 Unspecified atrioventricular block: Secondary | ICD-10-CM | POA: Diagnosis not present

## 2024-02-10 DIAGNOSIS — Z89511 Acquired absence of right leg below knee: Secondary | ICD-10-CM | POA: Diagnosis not present

## 2024-02-10 DIAGNOSIS — E1022 Type 1 diabetes mellitus with diabetic chronic kidney disease: Secondary | ICD-10-CM | POA: Diagnosis present

## 2024-02-10 DIAGNOSIS — I442 Atrioventricular block, complete: Secondary | ICD-10-CM | POA: Diagnosis present

## 2024-02-10 DIAGNOSIS — I492 Junctional premature depolarization: Secondary | ICD-10-CM | POA: Diagnosis present

## 2024-02-10 LAB — BASIC METABOLIC PANEL WITH GFR
Anion gap: 13 (ref 5–15)
BUN: 38 mg/dL — ABNORMAL HIGH (ref 8–23)
CO2: 25 mmol/L (ref 22–32)
Calcium: 8.1 mg/dL — ABNORMAL LOW (ref 8.9–10.3)
Chloride: 99 mmol/L (ref 98–111)
Creatinine, Ser: 5.99 mg/dL — ABNORMAL HIGH (ref 0.61–1.24)
GFR, Estimated: 10 mL/min — ABNORMAL LOW (ref >60)
Glucose, Bld: 150 mg/dL — ABNORMAL HIGH (ref 70–99)
Potassium: 3.6 mmol/L (ref 3.5–5.1)
Sodium: 137 mmol/L (ref 135–145)

## 2024-02-10 LAB — CBC
HCT: 25.9 % — ABNORMAL LOW (ref 39.0–52.0)
Hemoglobin: 6.7 g/dL — CL (ref 13.0–17.0)
MCH: 22.7 pg — ABNORMAL LOW (ref 26.0–34.0)
MCHC: 25.9 g/dL — ABNORMAL LOW (ref 30.0–36.0)
MCV: 87.8 fL (ref 80.0–100.0)
Platelets: 293 K/uL (ref 150–400)
RBC: 2.95 MIL/uL — ABNORMAL LOW (ref 4.22–5.81)
RDW: 17.1 % — ABNORMAL HIGH (ref 11.5–15.5)
WBC: 6.4 K/uL (ref 4.0–10.5)
nRBC: 0.5 % — ABNORMAL HIGH (ref 0.0–0.2)

## 2024-02-10 LAB — IRON AND TIBC
Iron: 77 ug/dL (ref 45–182)
Saturation Ratios: 50 % — ABNORMAL HIGH (ref 17.9–39.5)
TIBC: 155 ug/dL — ABNORMAL LOW (ref 250–450)
UIBC: 78 ug/dL

## 2024-02-10 LAB — PREPARE RBC (CROSSMATCH)

## 2024-02-10 LAB — GLUCOSE, CAPILLARY
Glucose-Capillary: 78 mg/dL (ref 70–99)
Glucose-Capillary: 249 mg/dL — ABNORMAL HIGH (ref 70–99)

## 2024-02-10 LAB — HEMOGLOBIN AND HEMATOCRIT, BLOOD
HCT: 33.1 % — ABNORMAL LOW (ref 39.0–52.0)
Hemoglobin: 9.4 g/dL — ABNORMAL LOW (ref 13.0–17.0)

## 2024-02-10 LAB — CBG MONITORING, ED
Glucose-Capillary: 121 mg/dL — ABNORMAL HIGH (ref 70–99)
Glucose-Capillary: 134 mg/dL — ABNORMAL HIGH (ref 70–99)
Glucose-Capillary: 136 mg/dL — ABNORMAL HIGH (ref 70–99)
Glucose-Capillary: 132 mg/dL — ABNORMAL HIGH (ref 70–99)

## 2024-02-10 LAB — FERRITIN: Ferritin: 4509 ng/mL — ABNORMAL HIGH (ref 24–336)

## 2024-02-10 LAB — TROPONIN I (HIGH SENSITIVITY)
Troponin I (High Sensitivity): 9053 ng/L (ref <18)
Troponin I (High Sensitivity): 4665 ng/L (ref <18)

## 2024-02-10 MED ORDER — HEPARIN BOLUS VIA INFUSION
2000.0000 [IU] | Freq: Once | INTRAVENOUS | Status: AC
  Administered 2024-02-10: 2000 [IU] via INTRAVENOUS

## 2024-02-10 MED ORDER — HEPARIN (PORCINE) 25000 UT/250ML-% IV SOLN
1100.0000 [IU]/h | INTRAVENOUS | Status: DC
  Administered 2024-02-10: 1100 [IU]/h via INTRAVENOUS
  Filled 2024-02-10: qty 250

## 2024-02-10 MED ORDER — SODIUM CHLORIDE 0.9% IV SOLUTION: Freq: Once | INTRAVENOUS | Status: AC

## 2024-02-10 MED ORDER — ATORVASTATIN CALCIUM 80 MG PO TABS
80.0000 mg | ORAL_TABLET | Freq: Every day | ORAL | Status: DC
Start: 2024-02-10 — End: 2024-02-12
  Administered 2024-02-10 – 2024-02-11 (×2): 80 mg via ORAL
  Filled 2024-02-10: qty 1
  Filled 2024-02-10: qty 2

## 2024-02-10 MED ORDER — INSULIN ASPART 100 UNIT/ML IJ SOLN
0.0000 [IU] | Freq: Three times a day (TID) | INTRAMUSCULAR | Status: DC
  Administered 2024-02-10 (×2): 2 [IU] via SUBCUTANEOUS
  Administered 2024-02-11: 8 [IU] via SUBCUTANEOUS
  Administered 2024-02-11: 3 [IU] via SUBCUTANEOUS
  Administered 2024-02-11: 8 [IU] via SUBCUTANEOUS
  Filled 2024-02-10 (×2): qty 1

## 2024-02-10 MED ORDER — ADULT MULTIVITAMIN W/MINERALS CH
1.0000 | ORAL_TABLET | Freq: Every day | ORAL | Status: DC
  Administered 2024-02-10 – 2024-02-11 (×2): 1 via ORAL
  Filled 2024-02-10 (×2): qty 1

## 2024-02-10 MED ORDER — HYDROCORTISONE SOD SUC (PF) 100 MG IJ SOLR
100.0000 mg | Freq: Three times a day (TID) | INTRAMUSCULAR | Status: DC
  Administered 2024-02-10 – 2024-02-12 (×5): 100 mg via INTRAVENOUS
  Filled 2024-02-10 (×6): qty 2

## 2024-02-10 MED ORDER — INSULIN ASPART 100 UNIT/ML IJ SOLN
0.0000 [IU] | Freq: Every day | INTRAMUSCULAR | Status: DC
  Administered 2024-02-10: 2 [IU] via SUBCUTANEOUS

## 2024-02-10 MED ORDER — METOCLOPRAMIDE HCL 5 MG/ML IJ SOLN: 10.0000 mg | Freq: Once | INTRAMUSCULAR | Status: DC

## 2024-02-10 MED ORDER — LACTATED RINGERS IV BOLUS
250.0000 mL | Freq: Once | INTRAVENOUS | Status: AC
  Administered 2024-02-10: 250 mL via INTRAVENOUS

## 2024-02-10 NOTE — Progress Notes (Signed)
    Blood pressure (!) 86/72, pulse (!) 48, temperature 97.7 F (36.5 C), temperature source Oral, resp. rate 15, height 5' 7 (1.702 m), weight 94 kg, SpO2 (!) 86%. On 2 L   Called PCCM through [employer] the case in detail with Dr. Mauro    Also discussed the patient with cardiology team at [facility] and nephrology Who believes patient is to go to critical care   At this point Dr. Candyce has refused and has not excepted the patient.  Will continue to transfuse 2U PRBC, IV boluses Will monitor closely   Patient will be transferred to ED to ED, cardiology and nephrology to follow closely PCCM has been formally consulted.     SIGNED: Adriana DELENA Grams, MD, FHM. FAAFP [facility],  Pager (please use [email or web address] to page/text)  Please use Epic Secure Chat for non-urgent communication (7AM-7PM) If 7PM-7AM, please contact night-coverage [email or web address],  02/10/2024, 12:47 PM

## 2024-02-10 NOTE — Assessment & Plan Note (Signed)
-   The patient will be placed on supplement coverage with NovoLog . - Will continue Neurontin

## 2024-02-10 NOTE — Hospital Course (Signed)
 Raymond [name] is a 63 y.o. African-American male with medical history significant for type 2 diabetes mellitus, essential hypertension, end-stage renal disease on hemodialysis, who has been having diarrhea over the last few weeks and today experienced syncope in his dialysis center and therefore he did not have it.  He was noted to have hypotension with blood pressure in the 50s.  He denies any chest pain or palpitations.  No cough or wheezing or dyspnea.  No nausea or vomiting or abdominal pain.  He has been having dizziness and generalized weakness.  No paresthesias or focal muscle weakness.  No vertigo or tinnitus.  No dysuria, oliguria or hematuria or flank pain.   ED Course:  BP was 151/39 with otherwise normal vital signs.  Labs revealed CO2 of 20 and blood Kos of 244 chloride 97 with a BUN of 32 and creatinine 5.54 calcium  8.4 with anion gap of 20.  Albumin  was 2.3 total protein was 8.3.  High-sensitivity troponin I was 3329 and later 3624.  EKG as reviewed by me : EKG showed sinus bradycardia with a rate of 52 with right axis deviation lateral ST segment depression with T wave inversion in 1 mm ST segment elevation in lead III. Repeat EKG showed normal sinus rhythm with a rate of 74 and borderline right axis deviation . Imaging: Portable chest x-ray showed cardiomegaly and no acute cardiopulmonary disease.   The patient was given 2 L bolus of IV lactated ringer .  The patient will be admitted to a cardiac telemetry observation bed at [facility] for further evaluation management.  I discussed the case with Dr. Duffy who will see the patient upon arrival.  I also notified Dr. Melia with nephrology.   IMPRESSION AND PLAN:  Assessment and Plan: Syncope Non-STEMI Hypotension Symptomatic anemia Due to respiratory failure End-stage renal disease   * Syncope -Multifactorial likely due to cardiogenic shock, orthostatic hypotension, hypovolemia, anemia - Monitoring closely, continue with  neurochecks - Will check orthostatics q 12 hours. - Elevated Cardiac enzymes. Related to non-STEMI and significantly elevated troponins..... Cardiology consulted   - will Continue to monitored for arrhythmias. -Ruling out cardiogenic shock - orthostatic hypotension, due to cardiogenic shock, severe anemia ---neurally mediated syncope,  -Pending Echo     NSTEMI (non-ST elevated myocardial infarction) (HCC) - Though the patient did not have chest pain however and suspected third-degree AV block. - Was on IV heparin  and high-dose statin.... -Discontinued IV heparin  per cardiology due to drop in hemoglobin  -Troponin continues to elevate - 2D echo-pending  - Cardiology consult will be needed upon arrival to [facility]. - The case was discussed with Dr.Chandrasekhar from cardiology at [facility]. -Discussed with cardiology team at [facility] - also amending ED to ED transfer now     Acute respiratory failure  - Possibly contributing from cardiogenic shock - Satting 86% on room air, initiating supplemental oxygen via nasal cannula - Continue O2 sat greater than 92% - Urine DuoNeb bronchodilators   hypotensive  Blood pressure running low, as low as 54/31, currently 86/72 - Ruling out cardiogenic shock, with non-STEMI - Sending IV fluids, 2U PRBC blood transfusion - Monitoring closely, discussing with PCCM may need pressors   Acute on chronic anemia - Hemoccult negative, iron studies in progress  - Worsening anemia,     Latest Ref Rng & Units 02/10/2024    5:02 AM 02/09/2024    3:10 PM 02/09/2024    3:01 PM  CBC  WBC 4.0 - 10.5 K/uL 6.4  8.6  Hemoglobin 13.0 - 17.0 g/dL 6.7  7.5  8.8   Hematocrit 39.0 - 52.0 % 25.9  29.3  26.0   Platelets 150 - 400 K/uL 293  273     - Proceed with 2U PRBC blood transfusion now   Type 2 diabetes mellitus with peripheral neuropathy (HCC) - CBG every 4 hours, SSI coverage - Will continue Neurontin    ESRD on hemodialysis [facility]) - Nephrology consult will be  needed for dialysis. - The patient is status post renal transplant. - Will continue Prograf , and Imuran  (holding for now) - Discussed with nephrologist Dr. Rayburn Maine with blood transfusion, IV fluid resuscitation, initiation of pressors needed

## 2024-02-10 NOTE — ED Notes (Signed)
 Pt difficult stick, 2nd RN to attempt IV.

## 2024-02-10 NOTE — Assessment & Plan Note (Addendum)
-   Nephrology consult will be needed for dialysis. - The patient is status post renal transplant. - Will continue Prograf , and Imuran .

## 2024-02-10 NOTE — Progress Notes (Signed)
 Spoke with covering hospitalist at [facility], patient has stable VS and does not need ICU consult for now.   Will not see the patient. Please call with questions    Valinda Novas, MD

## 2024-02-10 NOTE — ED Notes (Signed)
 Dr Ricky aware of troponin 9053.

## 2024-02-10 NOTE — ED Notes (Addendum)
 Dr Willette paged due to bp 80sys and HR 40s/50s and to check on progress to transfer

## 2024-02-10 NOTE — ED Notes (Signed)
 Dr Colanado in to see pt and pt lethargic. Pt still a/o. States just feel weak. DR shahmedhi in room as well, bp 80 sys. Pt hob down. Vo 250ml LR bolus. Will attempt 2nd iv site.

## 2024-02-10 NOTE — Assessment & Plan Note (Addendum)
-   The patient will be admitted to an observation medically monitored bed at [facility]. - Will check orthostatics q 12 hours. - Will for more serious cardiac enzymes. -This could be related to non-STEMI and significantly elevated troponins.. - The patient will be monitored for arrhythmias. -Differential diagnoses would include cardiogenic, orthostatic hypotension, neurally mediated syncope, arrhythmias related, and less likely hypoglycemia.

## 2024-02-10 NOTE — ED Notes (Addendum)
 Dr twana paged. Pt has had no pain/ no chest pain since first assessment at 8am

## 2024-02-10 NOTE — ED Provider Notes (Signed)
 Patient was seen by cardiology and nephrology this morning.  He is in significant heart block and his troponins have increased significantly.  Cardiology would like the patient transferred immediately over to the emergency department at [facility]  He already has hospitalist admission orders in.  Patient needs to see cardiology at [facility].  Dr. Dreama is accepting ED physician   Suzette Pac, MD 02/10/24 1158

## 2024-02-10 NOTE — ED Notes (Signed)
 Blood running into new usivg site at right upper arm.

## 2024-02-10 NOTE — ED Notes (Signed)
 Pt more alert now. States feeling a little better. Occult stool negative. Unable to urinate. Bp cuff small around right wrist. Britttany PA cardiology in with pt.

## 2024-02-10 NOTE — ED Notes (Addendum)
 Date and time results received: 02/10/24 0758 (use smartphrase .now to insert current time)  Test: troponin  Critical Value: 9053  Name of Provider Notified: [place]   Orders Received? Or Actions Taken?: Orders Received - See Orders for details

## 2024-02-10 NOTE — Consult Note (Signed)
 [facility] Renal Consultation Note    Indication for Consultation:  Management of ESRD/hemodialysis; anemia, hypertension/volume and secondary hyperparathyroidism  HPI: Raymond [name] is a 63 y.o. 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  He was at home therapies yesterday and developed chest pain and was sent to [facility] ED.  He was also having weakness and diarrhea for the past 3 weeks.  He had HD yesterday at home therapies, however it was cut short due to hypotension.  In the ED, Temp 97.9, Bp 105/72, HR 72, RR 14, SpO2 94%.  Labs were notable for Hgb 7.5, lactate 4.7, gluc 244, alb 2.3, troponin I 3,624.  EKG with sinus bradycardia and right axis deviation and lateral ST segment depression and t wave inversion.  He is being admitted for NSTEMI and we were consulted to provide dialysis during his admission.  He is currently awaiting transfer to [facility] for possible LHC.   He was seen in the ED and Bp dropped to 85/56 and HR down into the 40's.  He is lethargic but answers questions appropriately.  Dr. Willette at the bedside with me.  He is currently receiving a blood transfusion.  RN reports that his vital signs were stable with his breakfast and this is a new development.  He denies any chest pain at this time.  Past Medical History:  Diagnosis Date   Diabetes (HCC)    Diabetes mellitus without complication (HCC)    type 2   ESRD (end stage renal disease) (HCC)    03/09/2015- patient had a kidney transplant   Hypertension    Nausea vomiting and diarrhea 12/10/2015   Peripheral vascular disease (HCC)    Renal disorder    Renal insufficiency    Past Surgical History:  Procedure Laterality Date   ABDOMINAL AORTOGRAM W/LOWER EXTREMITY N/A 09/11/2021   Procedure: ABDOMINAL AORTOGRAM W/LOWER EXTREMITY;  Surgeon: Darron Deatrice LABOR, MD;  [facility];  Service: Cardiovascular;  Laterality: N/A;   AMPUTATION Right 09/25/2021   Procedure: RIGHT TRANSMETATARSAL AMPUTATION AND APPLY TISSUE GRAFT;  Surgeon: Harden Jerona GAILS, MD;  Location: [facility] OR;  Service: Orthopedics;  Laterality: Right;   AMPUTATION Right 11/16/2021   Procedure: RIGHT AMPUTATION BELOW KNEE WITH WOUND VAC APPLICATION;  Surgeon: Harden Jerona GAILS, MD;  Location: [facility] OR;  Service: Orthopedics;  Laterality: Right;   BONE BIOPSY Right 03/06/2021   Procedure: BONE BIOPSY;  Surgeon: Gershon Donnice SAUNDERS, DPM;  Location: [facility] ORS;  Service: Podiatry;  Laterality: Right;   CENTRAL VENOUS CATHETER INSERTION Right 01/20/2020   Procedure: INSERTION CENTRAL LINE ADULT; Tunneled central line;  Surgeon: Kallie Manuelita BROCKS, MD;  Location: [facility] ORS;  Service: General;  Laterality: Right;   COLONOSCOPY N/A 12/05/2014   DOQ:fnizmjuz external and internal hemorrhoid/mild diverticulosis/11 polyps removed   ESOPHAGOGASTRODUODENOSCOPY N/A 12/05/2014   DOQ:fpoi duodenitis   GRAFT APPLICATION Right 03/06/2021   Procedure: GRAFT APPLICATION;  Surgeon: Gershon Donnice SAUNDERS, DPM;  Location: [facility] ORS;  Service: Podiatry;  Laterality: Right;   INCISION AND DRAINAGE OF WOUND Right 08/15/2021   Procedure: IRRIGATION AND DEBRIDEMENT WOUND;  Surgeon: Burt Fus, DPM;  Location: [facility] ORS;  Service: Podiatry;  Laterality: Right;  need to make card not  a irrigation and debridement card   IR FLUORO GUIDE CV LINE RIGHT  03/01/2019   IR PERC  TUN PERIT CATH WO PORT S&I /IMAG  06/27/2021   IR REMOVAL TUN CV CATH W/O FL  05/10/2019   IR REMOVAL TUN CV CATH W/O FL  02/29/2020   IR REMOVAL TUN CV CATH W/O FL  07/19/2021   IR US  GUIDE VASC ACCESS RIGHT  03/01/2019   IR US  GUIDE VASC ACCESS RIGHT  06/27/2021   IRRIGATION AND DEBRIDEMENT ELBOW Left 06/24/2021   Procedure: IRRIGATION AND DEBRIDEMENT ELBOW;  Surgeon: Leora Lynwood SAUNDERS, MD;  Location: [facility] ORS;  Service: Orthopedics;  Laterality: Left;    KIDNEY TRANSPLANT  03/27/2015   KNEE ARTHROTOMY Right 06/08/2023   Procedure: KNEE ARTHROTOMY;  Surgeon: Onesimo Oneil LABOR, MD;  Location: [facility];  Service: Orthopedics;  Laterality: Right;   Penile Pump Insertion     PERIPHERAL VASCULAR ATHERECTOMY  09/11/2021   Procedure: PERIPHERAL VASCULAR ATHERECTOMY;  Surgeon: Darron Deatrice LABOR, MD;  [facility]: [facility];  Service: Cardiovascular;;  Shockwave Lithotripsy   TEE WITHOUT CARDIOVERSION N/A 01/17/2020   Procedure: TRANSESOPHAGEAL ECHOCARDIOGRAM (TEE) WITH PROPOFOL ;  Surgeon: Alvan Dorn FALCON, MD;  Location: [facility];  Service: Endoscopy;  Laterality: N/A;   WOUND DEBRIDEMENT Right 03/06/2021   Procedure: DEBRIDEMENT WOUND;  Surgeon: Gershon Donnice SAUNDERS, DPM;  Location: [facility]S;  Service: Podiatry;  Laterality: Right;   Family History:   Family History  Problem Relation Age of Onset   Diabetes Mother    Heart disease Father    Colon cancer Neg Hx    Social History:  reports that he has never smoked. He has never used smokeless tobacco. He reports that he does not drink alcohol and does not use drugs. Allergies  Allergen Reactions   Bactrim  [Sulfamethoxazole -Trimethoprim ] Other (See Comments)    Runs pt's sugar and phosphorus   Doxycycline  Other (See Comments)    Runs up pt's blood sugar and phosphorus   Flagyl  [Metronidazole ] Hives and Rash    Patient having hives to either the vancomycin  or flagyl  (both infusing together).   Vancomycin  Hives and Rash    Patient having hives to either vancomycin  or flagyl    Prior to Admission medications   Medication Sig Start Date End Date Taking? Authorizing Provider  acetaminophen  (TYLENOL ) 500 MG tablet Take 2 tablets (1,000 mg total) by mouth every 8 (eight) hours as needed. Patient taking differently: Take 1,000 mg by mouth every 6 (six) hours as needed for mild pain (pain score 1-3). 02/04/24 02/03/25 Yes Georgina Speaks, FNP  atorvastatin  (LIPITOR) 10 MG tablet Take 1 tablet by mouth  once daily 06/19/23  Yes Court Dorn PARAS, MD  Azathioprine  75 MG TABS Take 150 mg by mouth daily.   Yes [provider]  cloNIDine  (CATAPRES  - DOSED IN MG/24 HR) 0.3 mg/24hr patch Place 1 patch (0.3 mg total) onto the skin once a week. Patient taking differently: Place 0.3 mg onto the skin every Sunday. 08/21/21  Yes Court Dorn PARAS, MD  clopidogrel  (PLAVIX ) 75 MG tablet Take 75 mg by mouth daily. 04/09/23  Yes [provider]  doxazosin  (CARDURA ) 4 MG tablet Take 4 mg by mouth 2 (two) times daily. 02/02/23  Yes [provider]  Dulaglutide  (TRULICITY ) 3 MG/0.5ML SOAJ Inject 3 mg as directed once a week. Patient taking differently: Inject 3 mg as directed every Sunday. 09/07/23  Yes Thapa, [place], MD  furosemide  (LASIX ) 80 MG tablet Take 80 mg by mouth 2 (two) times daily as needed for fluid or edema. 11/02/23  Yes [provider]  gabapentin  (  NEURONTIN ) 300 MG capsule Take 300 mg by mouth at bedtime.   Yes [provider]  hydrALAZINE  (APRESOLINE ) 25 MG tablet Take 2 tablets (50 mg total) by mouth 3 (three) times daily. 06/12/23  Yes Johnson, Clanford L, MD  insulin  lispro (HUMALOG  KWIKPEN) 100 UNIT/ML KwikPen Inject 5-10 Units into the skin 3 (three) times daily. Maximum 30 units/day. 02/04/24  Yes Thapa, [place], MD  latanoprost  (XALATAN ) 0.005 % ophthalmic solution Place 1 drop into both eyes at bedtime. 07/25/21  Yes [provider]  loperamide  (IMODIUM ) 2 MG capsule Take 2 mg by mouth as needed for diarrhea or loose stools.   Yes [provider]  Multiple Vitamin (MULTIVITAMIN) capsule Take 1 capsule by mouth daily.   Yes [provider]  omeprazole  (PRILOSEC) 40 MG capsule Take 40 mg by mouth every morning. 03/31/23  Yes [provider]  oxyCODONE -acetaminophen  (PERCOCET/ROXICET) 5-325 MG tablet Take 1 tablet by mouth every 8 (eight) hours as needed for severe pain (pain score 7-10). 02/04/24  Yes Georgina Speaks, FNP   predniSONE  (DELTASONE ) 5 MG tablet Take 1 tablet (5 mg total) by mouth daily with breakfast. 02/04/24  Yes Georgina Speaks, FNP  sevelamer  carbonate (RENVELA ) 800 MG tablet Take 800 mg by mouth 3 (three) times daily with meals. 12/03/23 12/02/24 Yes [provider]  tacrolimus  (PROGRAF ) 1 MG capsule Take 3 mg by mouth 2 (two) times daily.   Yes [provider]  Vitamin D , Ergocalciferol , (DRISDOL ) 50000 units CAPS capsule Take 50,000 Units by mouth every 30 (thirty) days. 15th of the month   Yes [provider]  Vonoprazan Fumarate  (VOQUEZNA ) 10 MG TABS Take 1 tablet by mouth daily. 12/23/23  Yes [provider]  blood glucose meter kit and supplies KIT Dispense based on patient and insurance preference. Use up to four times daily as directed. (FOR ICD-9 250.00, 250.01). 04/11/20   [name], Gebreselassie W, MD  Blood Glucose Monitoring Suppl (ACCU-CHEK GUIDE ME) w/Device KIT 1 Piece by Does not apply route as directed. 02/18/18   [name], Gebreselassie W, MD  calcitRIOL  (ROCALTROL ) 0.25 MCG capsule Take 0.25 mcg by mouth. 10/21/23   [provider]  Continuous Blood Gluc Receiver (DEXCOM G7 RECEIVER) DEVI Use to check blood sugar 3 times a day. Dx code e11.65 12/16/21   Georgina Speaks, FNP  Continuous Glucose Sensor (DEXCOM G7 SENSOR) MISC USE TO CHECK GLUCOSE THREE TIMES DAILY AS DIRECTED, CHANGE SENSOR EVERY 10 DAYS. 09/09/23   Thapa, [place], MD  glucose blood (ACCU-CHEK GUIDE) test strip Use as instructed 4 x daily. E11.65 02/19/18   Lenis Ethelle ORN, MD   Current Facility-Administered Medications  Medication Dose Route Frequency Provider Last Rate Last Admin   0.9 %  sodium chloride  infusion (Manually program via Guardrails IV Fluids)   Intravenous Once Countryman, Chase, MD       0.9 %  sodium chloride  infusion (Manually program via Guardrails IV Fluids)   Intravenous Once Shahmehdi, Seyed A, MD       acetaminophen  (TYLENOL ) tablet 650 mg  650 mg Oral Q6H PRN  Mansy, Jan A, MD       Or   acetaminophen  (TYLENOL ) suppository 650 mg  650 mg Rectal Q6H PRN Mansy, Jan A, MD       atorvastatin  (LIPITOR) tablet 80 mg  80 mg Oral Daily Mansy, Jan A, MD       Azathioprine  TABS 150 mg  150 mg Oral Daily Mansy, Madison LABOR, MD  calcitRIOL  (ROCALTROL ) capsule 0.25 mcg  0.25 mcg Oral Daily Mansy, Jan A, MD   0.25 mcg at 02/10/24 0932   [START ON 02/14/2024] cloNIDine  (CATAPRES  - Dosed in mg/24 hr) patch 0.3 mg  0.3 mg Transdermal Q Sun Mansy, Jan A, MD       clopidogrel  (PLAVIX ) tablet 75 mg  75 mg Oral Daily Mansy, Jan A, MD   75 mg at 02/10/24 0935   doxazosin  (CARDURA ) tablet 4 mg  4 mg Oral BID Mansy, Jan A, MD       furosemide  (LASIX ) tablet 80 mg  80 mg Oral BID PRN Mansy, Jan A, MD       gabapentin  (NEURONTIN ) capsule 300 mg  300 mg Oral QHS Mansy, Jan A, MD   300 mg at 02/09/24 2143   hydrALAZINE  (APRESOLINE ) tablet 50 mg  50 mg Oral TID Mansy, Jan A, MD       insulin  aspart (novoLOG ) injection 0-15 Units  0-15 Units Subcutaneous TID WC Mansy, Madison LABOR, MD   2 Units at 02/10/24 9074   insulin  aspart (novoLOG ) injection 0-5 Units  0-5 Units Subcutaneous QHS Mansy, Jan A, MD       latanoprost  (XALATAN ) 0.005 % ophthalmic solution 1 drop  1 drop Both Eyes QHS Mansy, Jan A, MD   1 drop at 02/09/24 2143   loperamide  (IMODIUM ) capsule 2 mg  2 mg Oral PRN Mansy, Jan A, MD       magnesium  hydroxide (MILK OF MAGNESIA) suspension 30 mL  30 mL Oral Daily PRN Mansy, Jan A, MD       multivitamin with minerals tablet 1 tablet  1 tablet Oral Daily Shahmehdi, Seyed A, MD   1 tablet at 02/10/24 0932   ondansetron  (ZOFRAN ) tablet 4 mg  4 mg Oral Q6H PRN Mansy, Jan A, MD       Or   ondansetron  (ZOFRAN ) injection 4 mg  4 mg Intravenous Q6H PRN Mansy, Jan A, MD       oxyCODONE -acetaminophen  (PERCOCET/ROXICET) 5-325 MG per tablet 1 tablet  1 tablet Oral Q8H PRN Mansy, Jan A, MD       pantoprazole  (PROTONIX ) EC tablet 40 mg  40 mg Oral Daily Mansy, Jan A, MD   40 mg at 02/10/24 9068    sevelamer  carbonate (RENVELA ) tablet 800 mg  800 mg Oral TID WC Mansy, Jan A, MD   800 mg at 02/10/24 0932   sodium chloride  flush (NS) 0.9 % injection 3 mL  3 mL Intravenous Q12H Mansy, Jan A, MD   3 mL at 02/09/24 2145   tacrolimus  (PROGRAF ) capsule 3 mg  3 mg Oral BID Mansy, Jan A, MD   3 mg at 02/10/24 9062   traZODone  (DESYREL ) tablet 25 mg  25 mg Oral QHS PRN Mansy, Jan A, MD   25 mg at 02/09/24 2142   Vitamin D  (Ergocalciferol ) (DRISDOL ) 1.25 MG (50000 UNIT) capsule 50,000 Units  50,000 Units Oral Q30 days Mansy, Jan A, MD       Vonoprazan Fumarate  TABS 1 tablet  1 tablet Oral Daily Mansy, Jan A, MD       Current Outpatient Medications  Medication Sig Dispense Refill   acetaminophen  (TYLENOL ) 500 MG tablet Take 2 tablets (1,000 mg total) by mouth every 8 (eight) hours as needed. (Patient taking differently: Take 1,000 mg by mouth every 6 (six) hours as needed for mild pain (pain score 1-3).) 100 tablet 2   atorvastatin  (LIPITOR) 10 MG tablet Take  1 tablet by mouth once daily 30 tablet 0   Azathioprine  75 MG TABS Take 150 mg by mouth daily.     cloNIDine  (CATAPRES  - DOSED IN MG/24 HR) 0.3 mg/24hr patch Place 1 patch (0.3 mg total) onto the skin once a week. (Patient taking differently: Place 0.3 mg onto the skin every Sunday.) 4 patch 12   clopidogrel  (PLAVIX ) 75 MG tablet Take 75 mg by mouth daily.     doxazosin  (CARDURA ) 4 MG tablet Take 4 mg by mouth 2 (two) times daily.     Dulaglutide  (TRULICITY ) 3 MG/0.5ML SOAJ Inject 3 mg as directed once a week. (Patient taking differently: Inject 3 mg as directed every Sunday.) 6 mL 4   furosemide  (LASIX ) 80 MG tablet Take 80 mg by mouth 2 (two) times daily as needed for fluid or edema.     gabapentin  (NEURONTIN ) 300 MG capsule Take 300 mg by mouth at bedtime.     hydrALAZINE  (APRESOLINE ) 25 MG tablet Take 2 tablets (50 mg total) by mouth 3 (three) times daily.     insulin  lispro (HUMALOG  KWIKPEN) 100 UNIT/ML KwikPen Inject 5-10 Units into the  skin 3 (three) times daily. Maximum 30 units/day. 15 mL 11   latanoprost  (XALATAN ) 0.005 % ophthalmic solution Place 1 drop into both eyes at bedtime.     loperamide  (IMODIUM ) 2 MG capsule Take 2 mg by mouth as needed for diarrhea or loose stools.     Multiple Vitamin (MULTIVITAMIN) capsule Take 1 capsule by mouth daily.     omeprazole  (PRILOSEC) 40 MG capsule Take 40 mg by mouth every morning.     oxyCODONE -acetaminophen  (PERCOCET/ROXICET) 5-325 MG tablet Take 1 tablet by mouth every 8 (eight) hours as needed for severe pain (pain score 7-10). 20 tablet 0   predniSONE  (DELTASONE ) 5 MG tablet Take 1 tablet (5 mg total) by mouth daily with breakfast.     sevelamer  carbonate (RENVELA ) 800 MG tablet Take 800 mg by mouth 3 (three) times daily with meals.     tacrolimus  (PROGRAF ) 1 MG capsule Take 3 mg by mouth 2 (two) times daily.     Vitamin D , Ergocalciferol , (DRISDOL ) 50000 units CAPS capsule Take 50,000 Units by mouth every 30 (thirty) days. 15th of the month     Vonoprazan Fumarate  (VOQUEZNA ) 10 MG TABS Take 1 tablet by mouth daily.     blood glucose meter kit and supplies KIT Dispense based on patient and insurance preference. Use up to four times daily as directed. (FOR ICD-9 250.00, 250.01). 1 each 0   Blood Glucose Monitoring Suppl (ACCU-CHEK GUIDE ME) w/Device KIT 1 Piece by Does not apply route as directed. 1 kit 0   calcitRIOL  (ROCALTROL ) 0.25 MCG capsule Take 0.25 mcg by mouth.     Continuous Blood Gluc Receiver (DEXCOM G7 RECEIVER) DEVI Use to check blood sugar 3 times a day. Dx code e11.65 1 each 1   Continuous Glucose Sensor (DEXCOM G7 SENSOR) MISC USE TO CHECK GLUCOSE THREE TIMES DAILY AS DIRECTED, CHANGE SENSOR EVERY 10 DAYS. 9 each 3   glucose blood (ACCU-CHEK GUIDE) test strip Use as instructed 4 x daily. E11.65 150 each 5   Labs: Basic Metabolic Panel: Recent Labs  Lab 02/09/24 1457 02/09/24 1501 02/09/24 1510 02/10/24 0502  NA  --  135 137 137  K  --  5.0 3.7 3.6  CL  --   100 97* 99  CO2  --   --  20* 25  GLUCOSE  --  260* 244* 150*  BUN  --  38* 32* 38*  CREATININE  --  5.70* 5.54* 5.99*  CALCIUM   --   --  8.4* 8.1*  PHOS 5.3*  --   --   --    Liver Function Tests: Recent Labs  Lab 02/09/24 1510  AST 33  ALT 13  ALKPHOS 125  BILITOT 0.8  PROT 8.3*  ALBUMIN  2.3*   No results for input(s): LIPASE, AMYLASE in the last 168 hours. No results for input(s): AMMONIA in the last 168 hours. CBC: Recent Labs  Lab 02/04/24 1725 02/09/24 1501 02/09/24 1510 02/10/24 0502  WBC 7.1  --  8.6 6.4  NEUTROABS 5.0  --  5.9  --   HGB 7.5* 8.8* 7.5* 6.7*  HCT 26.7* 26.0* 29.3* 25.9*  MCV 82  --  90.7 87.8  PLT 343  --  273 293   Cardiac Enzymes: No results for input(s): CKTOTAL, CKMB, CKMBINDEX, TROPONINI in the last 168 hours. CBG: Recent Labs  Lab 02/10/24 0315 02/10/24 0852  GLUCAP 121* 134*   Iron Studies: No results for input(s): IRON, TIBC, TRANSFERRIN, FERRITIN in the last 72 hours. Studies/Results: DG Chest Portable 1 View Result Date: 02/09/2024 CLINICAL DATA:  Weakness. EXAM: PORTABLE CHEST 1 VIEW COMPARISON:  09/01/2023. FINDINGS: Stable cardiomegaly. No overt pulmonary edema, focal consolidation, pleural effusion, or pneumothorax. Left axillary vascular stent. No acute osseous abnormality. IMPRESSION: 1. No acute cardiopulmonary findings. 2. Stable cardiomegaly. Electronically Signed   By: Harrietta Sherry M.D.   On: 02/09/2024 16:04    ROS: Pertinent items are noted in HPI. Physical Exam: Vitals:   02/10/24 0800 02/10/24 0900 02/10/24 0903 02/10/24 0928  BP: (!) 164/53 (!) 116/49 (!) 116/49 (!) 122/50  Pulse:   77 79  Resp: 13 14 18 19   Temp:   97.9 F (36.6 C) 97.9 F (36.6 C)  TempSrc:   Oral Oral  SpO2:   97%   Weight:      Height:          Weight change:   Intake/Output Summary (Last 24 hours) at 02/10/2024 0953 Last data filed at 02/10/2024 9094 Gross per 24 hour  Intake 1315 ml  Output --  Net  1315 ml   BP (!) 122/50   Pulse 79   Temp 97.9 F (36.6 C) (Oral)   Resp 19   Ht 5' 7 (1.702 m)   Wt 94 kg   SpO2 97%   BMI 32.46 kg/m  General appearance: distracted, fatigued, and slowed mentation Head: Normocephalic, without obvious abnormality, atraumatic Resp: clear to auscultation bilaterally Cardio: bradycardic in the 40's-50s GI: soft, non-tender; bowel sounds normal; no masses,  no organomegaly Extremities: edema trace pretibial edema on the right, s/p left BKA and LUE AVF +T/B Dialysis Access:  Dialysis Orders: Center: [facility]  on HHD MonTuesThursFri . Unknown prescription as he is managed by Dr. Rachele  Assessment/Plan:  NSTEMI - awaiting transfer to [facility], however no beds were available.  Now he is hypotensive and bradycardic.  Will need urgent transfer for cardiac evaluation.  Acute on chronic anemia - Hgb dropped from 8.3 to 6.7.  receiving blood transfusion and heparin  drip stopped.  He denies any hematochezia, melena, or BRBPR.  ESRD -  normally on MonTuesThuFri schedule with home HD.  Had HD yesterday and too unstable for HD today.  Will continue to follow and plan for HD again in the next 24-48 hours.  Electrolytes stable and volume stable  Hypertension/volume  -  as above, hypotensive and receiving blood transfusion.  Metabolic bone disease -   continue home meds  Nutrition - renal diet, carb modified  DM type 2 - per primary  Fairy RONAL Sellar, MD [facility], [facility] 02/10/2024, 9:53 AM

## 2024-02-10 NOTE — ED Notes (Signed)
 Pt BIB [facility] from [facility] due to hypotension and weakness.  Pt reports he had dialysis yesterday but unaware if he finished the whole treatment.  Pt reports he had 3 weeks of diarrhea.  Has received 2 L boluses and 2 units of blood at [facility].  Pt fistula on left arm.  Two IV's on right arm.  VS [facility] HR 45, BP 118 palpated

## 2024-02-10 NOTE — Consult Note (Addendum)
 Cardiology Consultation   Patient ID: Raymond [name] MRN: [id number]; DOB: [birth year]  Admit date: 02/09/2024 Date of Consult: 02/10/2024  PCP:  Raymond Speaks, FNP   [facility] Providers Cardiologist:  Raymond Lesches, MD        Patient Profile: Raymond [name] is a 63 y.o. male with a hx of PAD (s/p prior angioplasty and eventually required right BKA), HTN, HLD and ESRD who is being seen 02/10/2024 for the evaluation of NSTEMI at the request of Dr. Willette.  History of Present Illness:  Raymond [name] presented to [name] ED on 02/09/2024 for evaluation of worsening diarrhea for the past [redacted] weeks along with syncopal episodes. Initial EKG was concerning for third-degree AV block and he was hypotensive with initial BP reported at 54/31  Received fluids with improvement in BP and return to normal sinus rhythm. Initial labs showed WBC 8.6, Hgb 7.5, platelets 273, Na+ 137, K+ 3.7 and creatinine 5.54. Albumin  2.3. Lactic acid 4.7 with repeat at 3.6. Hs Troponin values at 3329 and 3624 initially. Rechecked this AM and further elevated to 9053.  Hemoglobin had further declined to 6.7 this morning and he is receiving 2 units PRBCs.   Past Medical History:  Diagnosis Date   Diabetes (HCC)    Diabetes mellitus without complication (HCC)    type 2   ESRD (end stage renal disease) (HCC)    03/09/2015- patient had a kidney transplant   Hypertension    Nausea vomiting and diarrhea 12/10/2015   Peripheral vascular disease (HCC)    Renal disorder    Renal insufficiency     Past Surgical History:  Procedure Laterality Date   ABDOMINAL AORTOGRAM W/LOWER EXTREMITY N/A 09/11/2021   Procedure: ABDOMINAL AORTOGRAM W/LOWER EXTREMITY;  Surgeon: Raymond Deatrice LABOR, MD;  Location: [facility];  Service: Cardiovascular;  Laterality: N/A;   AMPUTATION Right 09/25/2021   Procedure: RIGHT TRANSMETATARSAL AMPUTATION AND APPLY TISSUE GRAFT;  Surgeon: Raymond Jerona GAILS, MD;   Location: [facility] OR;  Service: Orthopedics;  Laterality: Right;   AMPUTATION Right 11/16/2021   Procedure: RIGHT AMPUTATION BELOW KNEE WITH WOUND VAC APPLICATION;  Surgeon: Raymond Jerona GAILS, MD;  Location: [facility] OR;  Service: Orthopedics;  Laterality: Right;   BONE BIOPSY Right 03/06/2021   Procedure: BONE BIOPSY;  Surgeon: Raymond [name], DPM;  Location: [facility] ORS;  Service: Podiatry;  Laterality: Right;   CENTRAL VENOUS CATHETER INSERTION Right 01/20/2020   Procedure: INSERTION CENTRAL LINE ADULT; Tunneled central line;  Surgeon: Raymond Manuelita BROCKS, MD;  Location: [facility];  Service: General;  Laterality: Right;   COLONOSCOPY N/A 12/05/2014   DOQ:fnizmjuz external and internal hemorrhoid/mild diverticulosis/11 polyps removed   ESOPHAGOGASTRODUODENOSCOPY N/A 12/05/2014   DOQ:fpoi duodenitis   GRAFT APPLICATION Right 03/06/2021   Procedure: GRAFT APPLICATION;  Surgeon: Raymond [name], DPM;  Location: [facility] ORS;  Service: Podiatry;  Laterality: Right;   INCISION AND DRAINAGE OF WOUND Right 08/15/2021   Procedure: IRRIGATION AND DEBRIDEMENT WOUND;  Surgeon: Raymond [name], DPM;  Location: [facility] ORS;  Service: Podiatry;  Laterality: Right;  need to make card not  a irrigation and debridement card   IR FLUORO GUIDE CV LINE RIGHT  03/01/2019   IR PERC TUN PERIT CATH WO PORT S&I /IMAG  06/27/2021   IR REMOVAL TUN CV CATH W/O FL  05/10/2019   IR REMOVAL TUN CV CATH W/O FL  02/29/2020   IR REMOVAL TUN CV CATH W/O FL  07/19/2021   IR US  GUIDE VASC ACCESS  RIGHT  03/01/2019   IR US  GUIDE VASC ACCESS RIGHT  06/27/2021   IRRIGATION AND DEBRIDEMENT ELBOW Left 06/24/2021   Procedure: IRRIGATION AND DEBRIDEMENT ELBOW;  Surgeon: Raymond Lynwood SAUNDERS, MD;  Location: [facility] ORS;  Service: Orthopedics;  Laterality: Left;   KIDNEY TRANSPLANT  03/27/2015   KNEE ARTHROTOMY Right 06/08/2023   Procedure: KNEE ARTHROTOMY;  Surgeon: Raymond Oneil LABOR, MD;  Location: [facility];  Service: Orthopedics;  Laterality: Right;   Penile Pump Insertion      PERIPHERAL VASCULAR ATHERECTOMY  09/11/2021   Procedure: PERIPHERAL VASCULAR ATHERECTOMY;  Surgeon: Raymond Deatrice LABOR, MD;  Location: [facility];  Service: Cardiovascular;;  Shockwave Lithotripsy   TEE WITHOUT CARDIOVERSION N/A 01/17/2020   Procedure: TRANSESOPHAGEAL ECHOCARDIOGRAM (TEE) WITH PROPOFOL ;  Surgeon: Raymond Raymond FALCON, MD;  Location: [facility];  Service: Endoscopy;  Laterality: N/A;   WOUND DEBRIDEMENT Right 03/06/2021   Procedure: DEBRIDEMENT WOUND;  Surgeon: Raymond [name], DPM;  Location: [facility] ORS;  Service: Podiatry;  Laterality: Right;     Home Medications:  Prior to Admission medications   Medication Sig Start Date End Date Taking? Authorizing Provider  acetaminophen  (TYLENOL ) 500 MG tablet Take 2 tablets (1,000 mg total) by mouth every 8 (eight) hours as needed. Patient taking differently: Take 1,000 mg by mouth every 6 (six) hours as needed for mild pain (pain score 1-3). 02/04/24 02/03/25 Yes Raymond Speaks, FNP  atorvastatin  (LIPITOR) 10 MG tablet Take 1 tablet by mouth once daily 06/19/23  Yes Raymond Raymond PARAS, MD  Azathioprine  75 MG TABS Take 150 mg by mouth daily.   Yes [provider]  cloNIDine  (CATAPRES  - DOSED IN MG/24 HR) 0.3 mg/24hr patch Place 1 patch (0.3 mg total) onto the skin once a week. Patient taking differently: Place 0.3 mg onto the skin every Sunday. 08/21/21  Yes Raymond Raymond PARAS, MD  clopidogrel  (PLAVIX ) 75 MG tablet Take 75 mg by mouth daily. 04/09/23  Yes [provider]  doxazosin  (CARDURA ) 4 MG tablet Take 4 mg by mouth 2 (two) times daily. 02/02/23  Yes [provider]  Dulaglutide  (TRULICITY ) 3 MG/0.5ML SOAJ Inject 3 mg as directed once a week. Patient taking differently: Inject 3 mg as directed every Sunday. 09/07/23  Yes Thapa, [place], MD  furosemide  (LASIX ) 80 MG tablet Take 80 mg by mouth 2 (two) times daily as needed for fluid or edema. 11/02/23  Yes [provider]  gabapentin  (NEURONTIN ) 300 MG  capsule Take 300 mg by mouth at bedtime.   Yes [provider]  hydrALAZINE  (APRESOLINE ) 25 MG tablet Take 2 tablets (50 mg total) by mouth 3 (three) times daily. 06/12/23  Yes Johnson, Clanford L, MD  insulin  lispro (HUMALOG  KWIKPEN) 100 UNIT/ML KwikPen Inject 5-10 Units into the skin 3 (three) times daily. Maximum 30 units/day. 02/04/24  Yes Thapa, [place], MD  latanoprost  (XALATAN ) 0.005 % ophthalmic solution Place 1 drop into both eyes at bedtime. 07/25/21  Yes [provider]  loperamide  (IMODIUM ) 2 MG capsule Take 2 mg by mouth as needed for diarrhea or loose stools.   Yes [provider]  Multiple Vitamin (MULTIVITAMIN) capsule Take 1 capsule by mouth daily.   Yes [provider]  omeprazole  (PRILOSEC) 40 MG capsule Take 40 mg by mouth every morning. 03/31/23  Yes [provider]  oxyCODONE -acetaminophen  (PERCOCET/ROXICET) 5-325 MG tablet Take 1 tablet by mouth every 8 (eight) hours as needed for severe pain (pain score 7-10). 02/04/24  Yes Raymond Speaks, FNP  predniSONE  (DELTASONE ) 5 MG tablet Take 1 tablet (5 mg total) by mouth daily with breakfast. 02/04/24  Yes Raymond Speaks, FNP  sevelamer  carbonate (RENVELA ) 800 MG tablet Take 800 mg by mouth 3 (three) times daily with meals. 12/03/23 12/02/24 Yes [provider]  tacrolimus  (PROGRAF ) 1 MG capsule Take 3 mg by mouth 2 (two) times daily.   Yes [provider]  Vitamin D , Ergocalciferol , (DRISDOL ) 50000 units CAPS capsule Take 50,000 Units by mouth every 30 (thirty) days. 15th of the month   Yes [provider]  Vonoprazan Fumarate  (VOQUEZNA ) 10 MG TABS Take 1 tablet by mouth daily. 12/23/23  Yes [provider]  blood glucose meter kit and supplies KIT Dispense based on patient and insurance preference. Use up to four times daily as directed. (FOR ICD-9 250.00, 250.01). 04/11/20   [name], Gebreselassie W, MD  Blood Glucose Monitoring Suppl (ACCU-CHEK GUIDE ME) w/Device KIT 1  Piece by Does not apply route as directed. 02/18/18   [name], Gebreselassie W, MD  calcitRIOL  (ROCALTROL ) 0.25 MCG capsule Take 0.25 mcg by mouth. 10/21/23   [provider]  Continuous Blood Gluc Receiver (DEXCOM G7 RECEIVER) DEVI Use to check blood sugar 3 times a day. Dx code e11.65 12/16/21   Raymond Speaks, FNP  Continuous Glucose Sensor (DEXCOM G7 SENSOR) MISC USE TO CHECK GLUCOSE THREE TIMES DAILY AS DIRECTED, CHANGE SENSOR EVERY 10 DAYS. 09/09/23   Thapa, [place], MD  glucose blood (ACCU-CHEK GUIDE) test strip Use as instructed 4 x daily. E11.65 02/19/18   [name], Gebreselassie W, MD    Scheduled Meds:  atorvastatin   80 mg Oral Daily   azaTHIOprine   150 mg Oral Daily   calcitRIOL   0.25 mcg Oral Daily   gabapentin   300 mg Oral QHS   insulin  aspart  0-15 Units Subcutaneous TID WC   insulin  aspart  0-5 Units Subcutaneous QHS   latanoprost   1 drop Both Eyes QHS   multivitamin with minerals  1 tablet Oral Daily   pantoprazole   40 mg Oral Daily   sevelamer  carbonate  800 mg Oral TID WC   sodium chloride  flush  3 mL Intravenous Q12H   tacrolimus   3 mg Oral BID   Vitamin D  (Ergocalciferol )  50,000 Units Oral Q30 days   Vonoprazan Fumarate   1 tablet Oral Daily   Continuous Infusions:  PRN Meds: acetaminophen  **OR** acetaminophen , loperamide , magnesium  hydroxide, ondansetron  **OR** ondansetron  (ZOFRAN ) IV, oxyCODONE -acetaminophen , traZODone   Allergies:    Allergies  Allergen Reactions   Bactrim  [Sulfamethoxazole -Trimethoprim ] Other (See Comments)    Runs pt's sugar and phosphorus   Doxycycline  Other (See Comments)    Runs up pt's blood sugar and phosphorus   Flagyl  [Metronidazole ] Hives and Rash    Patient having hives to either the vancomycin  or flagyl  (both infusing together).   Vancomycin  Hives and Rash    Patient having hives to either vancomycin  or flagyl     Social History:   Social History   Socioeconomic History   Marital status: Married    Spouse name: Not on file    Number of children: Not on file   Years of education: Not on file   Highest education level: Not on file  Occupational History   Not on file  Tobacco Use   Smoking status: Never   Smokeless tobacco: Never  Vaping Use   Vaping status: Never Used  Substance and Sexual Activity   Alcohol use: No    Alcohol/week: 0.0 standard drinks of alcohol   Drug  use: No   Sexual activity: Yes  Other Topics Concern   Not on file  Social History Narrative   ** Merged History Encounter **        Family History:    Family History  Problem Relation Age of Onset   Diabetes Mother    Heart disease Father    Colon cancer Neg Hx      ROS:  Please see the history of present illness.   All other ROS reviewed and negative.     Physical Exam/Data: Vitals:   02/10/24 1200 02/10/24 1233 02/10/24 1330 02/10/24 1428  BP: (!) 86/72 (!) 109/45 120/80 (!) 101/57  Pulse:    (!) 50  Resp: 15 20  19   Temp:      TempSrc:      SpO2:    98%  Weight:      Height:        Intake/Output Summary (Last 24 hours) at 02/10/2024 1436 Last data filed at 02/10/2024 1139 Gross per 24 hour  Intake 2110.33 ml  Output --  Net 2110.33 ml      02/09/2024    2:07 PM 02/04/2024    4:08 PM 02/02/2024    3:36 PM  Last 3 Weights  Weight (lbs) 207 lb 3.7 oz -- 207 lb 14.4 oz  Weight (kg) 94 kg -- 94.303 kg     Body mass index is 32.46 kg/m.  General:  Well nourished, well developed, in no acute distress HEENT: normal Neck: no JVD Vascular: No carotid bruits; Distal pulses 2+ bilaterally Cardiac:  normal S1, S2; RRR; no murmur  Lungs:  clear to auscultation bilaterally, no wheezing, rhonchi or rales  Abd: soft, nontender, no hepatomegaly  Ext: no edema Musculoskeletal: Right BKA; BUE and BLE strength normal and equal Skin: warm and dry  Neuro:  CNs 2-12 intact, no focal abnormalities noted Psych:  Normal affect   EKG:  The EKG was personally reviewed and demonstrates: Normal sinus rhythm with intermittent  sinus bradycardia, junctional rhythm with A-V dissociation Telemetry:  Telemetry was personally reviewed and demonstrates: Sinus bradycardia with A-V dissociation and junctional rhythm  Relevant CV Studies:  Echocardiogram: 06/2022 IMPRESSIONS     1. Left ventricular ejection fraction, by estimation, is 60 to 65%. The  left ventricle has normal function. The left ventricle has no regional  wall motion abnormalities. Left ventricular diastolic parameters are  consistent with Grade I diastolic  dysfunction (impaired relaxation). Elevated left ventricular end-diastolic  pressure. The average left ventricular global longitudinal strain is -21.6  %. The global longitudinal strain is normal.   2. Right ventricular systolic function is normal. The right ventricular  size is normal.   3. The mitral valve is normal in structure. No evidence of mitral valve  regurgitation. No evidence of mitral stenosis.   4. The aortic valve is tricuspid. Aortic valve regurgitation is not  visualized. Aortic valve sclerosis/calcification is present, without any  evidence of aortic stenosis.   5. Aortic dilatation noted. There is borderline dilatation of the aortic  root, measuring 37 mm.   6. The inferior vena cava is normal in size with greater than 50%  respiratory variability, suggesting right atrial pressure of 3 mmHg.    Laboratory Data: High Sensitivity Troponin:   Recent Labs  Lab 02/09/24 1510 02/09/24 1742 02/10/24 0502  TROPONINIHS 3,329* 3,624* 9,053*     Chemistry Recent Labs  Lab 02/09/24 1501 02/09/24 1510 02/10/24 0502  NA 135 137 137  K 5.0  3.7 3.6  CL 100 97* 99  CO2  --  20* 25  GLUCOSE 260* 244* 150*  BUN 38* 32* 38*  CREATININE 5.70* 5.54* 5.99*  CALCIUM   --  8.4* 8.1*  MG  --  2.1  --   GFRNONAA  --  11* 10*  ANIONGAP  --  20* 13    Recent Labs  Lab 02/09/24 1510  PROT 8.3*  ALBUMIN  2.3*  AST 33  ALT 13  ALKPHOS 125  BILITOT 0.8   Lipids No results for  input(s): CHOL, TRIG, HDL, LABVLDL, LDLCALC, CHOLHDL in the last 168 hours.  Hematology Recent Labs  Lab 02/04/24 1725 02/09/24 1501 02/09/24 1510 02/10/24 0502  WBC 7.1  --  8.6 6.4  RBC 3.25*  --  3.23* 2.95*  HGB 7.5* 8.8* 7.5* 6.7*  HCT 26.7* 26.0* 29.3* 25.9*  MCV 82  --  90.7 87.8  MCH 23.1*  --  23.2* 22.7*  MCHC 28.1*  --  25.6* 25.9*  RDW 14.5  --  16.9* 17.1*  PLT 343  --  273 293   Thyroid No results for input(s): TSH, FREET4 in the last 168 hours.  BNPNo results for input(s): BNP, PROBNP in the last 168 hours.  DDimer No results for input(s): DDIMER in the last 168 hours.  Radiology/Studies:  DG Chest Portable 1 View Result Date: 02/09/2024 CLINICAL DATA:  Weakness. EXAM: PORTABLE CHEST 1 VIEW COMPARISON:  09/01/2023. FINDINGS: Stable cardiomegaly. No overt pulmonary edema, focal consolidation, pleural effusion, or pneumothorax. Left axillary vascular stent. No acute osseous abnormality. IMPRESSION: 1. No acute cardiopulmonary findings. 2. Stable cardiomegaly. Electronically Signed   By: Harrietta Sherry M.D.   On: 02/09/2024 16:04     Assessment and Plan:  Syncope Junctional bradycardia Admitted with episode of syncope in the setting of worsening diarrhea for the past 3 weeks Initial EKG showed sinus bradycardia with A-V dissociation and junctional escape rhythm Subsequent EKG showed normal sinus rhythm, sinus bradycardia, junctional rhythm with A-V dissociation and anterior lateral ST abnormality Difficult to get a history from patient but says that he has had episodes where he blacked out.  He said he could tell that there was something happening around him but he was not coherent enough to know what was going on Initial BP on arrival in ER was 54/31 mmHg which improved with IV fluids and return of normal sinus rhythm on telemetry Lactic acid was elevated at 4.7 but repeat 3.6.  Serum creatinine 5.54 and hemoglobin 7.5 Suspect syncope and  dizziness multifactorial related to volume depletion from diarrhea as well as significant anemia Patient is not on any rate slowing or AV nodal blocking agents at home but is on a clonidine  patch Continue monitoring on telemetry Patient is being transferred to [facility] will be seen by EP  Elevated troponin Denies any chest pain or pressure Hs troponin elevated at 3329>> 3624>> 9053 Unclear whether this is a type I or type II NSTEMI EKG shows anterior lateral ST-T wave abnormality Patient has not had any chest pain but has had dizziness and syncope Admitted with profound anemia, severe hypotension with systolic BP in the 50s, acidosis which could all result in demand ischemia Given patient's history of PAD suspect he probably has significant CAD as well.  Chest CT 09/01/2023 mention multivessel coronary vascular calcifications Will check 2D echo Not a candidate at this time for cath given his severe anemia at 6.7. He was on Plavix  outpatient for his PAD but given  his significant anemia will hold Plavix  for now  Anemia He had a hemoglobin of 12 on 11/02/2023 which dropped to 7.5 on 02/04/2024 and now down to 6.7 Unclear whether anemia of chronic disease related to his ESRD or GI blood loss Has been having diarrhea for 3 weeks Follow stool Hemoccult GI consult today Check iron studies  Hypertension Hypotension Currently hypotensive secondary to volume depletion and possible bradycardia arrhythmias Getting IV fluid resuscitation and BP currently 109/45 mmHg Will hold PTA clonidine  patch, doxazosin  4 mg twice daily and hydralazine  50 mg twice daily  He is on chronic steroids for his failing renal transplant and therefore need to consider adrenal insufficiency in the setting of acute illness.  Recommend giving stress dose steroids>> his steroids that he is typically on at home and not been restarted either  ESRD  Status post renal transplant 2016 Unfortunately has had allograft deterioration  and has become uremic again and is restarted on dialysis 01/01/2024 Currently on tacrolimus  3mg  twice daily and prednisone  5 mg daily Mycophenolate  was discontinued  PAD HLD Vascular angiography 2023 : Occluded proximal anterior tibial artery with reconstitution of the dorsalis pedis as well as 80-90% posterior tibial artery stenosis status post intravascular lithotripsy with balloon angioplasty to the right posterior tibial artery S/P right BKA by Dr. Harden 11/16/2021 for poor right heel wound healing Lipids: LDL 93, HDL 59, triglycerides 84 on 09/07/2023 Repeat FLP in a.m. LDL goal less than 70 Atorvastatin  has been increased to 80 mg daily this admission Will need FLP and ALT in 6 weeks  Performed by: Wilbert Bihari, MD   Total critical care time: 60 minutes   Critical care time was exclusive of separately billable procedures and treating other patients.   Critical care was necessary to treat or prevent imminent or life-threatening deterioration.   Critical care was time spent personally by me (independent of APPs or residents) on the following activities: development of treatment plan with patient and/or surrogate as well as nursing, discussions with consultants, evaluation of patient's response to treatment, examination of patient, obtaining history from patient or surrogate, ordering and performing treatments and interventions, ordering and review of laboratory studies, ordering and review of radiographic studies, pulse oximetry and re-evaluation of patient's condition.  Risk Assessment/Risk Scores:    TIMI Risk Score for Unstable Angina or Non-ST Elevation MI:   The patient's TIMI risk score is 3, which indicates a 13% risk of all cause mortality, new or recurrent myocardial infarction or need for urgent revascularization in the next 14 days.   For questions or updates, please contact [facility] Please consult [email or web address] for contact info under   Signed, Wilbert Bihari,  MD [facility] 02/10/2024

## 2024-02-10 NOTE — Progress Notes (Signed)
  Echocardiogram 2D Echocardiogram has been performed.  Thea Norlander 02/10/2024, 6:01 PM

## 2024-02-10 NOTE — Assessment & Plan Note (Addendum)
-   Though the patient did not have chest pain however and suspected third-degree AV block. - Will place him on IV heparin  and high-dose statin. - Will follow serial troponins. - 2D echo was ordered. - Cardiology consult will be needed upon arrival to [facility]. - The case was discussed with Dr.Chandrasekhar from cardiology at [facility].

## 2024-02-10 NOTE — Progress Notes (Signed)
 PROGRESS NOTE    Patient: Raymond [name]                            PCP: Georgina Speaks, FNP                    DOB: 1961/04/24            DOA: 02/09/2024 FMW:[id number]             DOS: 02/10/2024, 12:31 PM   LOS: 0 days   Date of Service: The patient was seen and examined on 02/10/2024  Subjective:   The patient was seen and examined this morning. Somnolent, hemodynamically unstable... Satting 86% on room air, bradycardic heart rate 48, RR 15, blood pressure low at 86/72 Denies any chest pain  Brief Narrative:   Raymond [name] is a 63 y.o. African-American male with medical history significant for type 2 diabetes mellitus, essential hypertension, end-stage renal disease on hemodialysis, who has been having diarrhea over the last few weeks and today experienced syncope in his dialysis center and therefore he did not have it.  He was noted to have hypotension with blood pressure in the 50s.  He denies any chest pain or palpitations.  No cough or wheezing or dyspnea.  No nausea or vomiting or abdominal pain.  He has been having dizziness and generalized weakness.  No paresthesias or focal muscle weakness.  No vertigo or tinnitus.  No dysuria, oliguria or hematuria or flank pain.   ED Course:  BP was 151/39 with otherwise normal vital signs.  Labs revealed CO2 of 20 and blood Kos of 244 chloride 97 with a BUN of 32 and creatinine 5.54 calcium  8.4 with anion gap of 20.  Albumin  was 2.3 total protein was 8.3.  High-sensitivity troponin I was 3329 and later 3624.  EKG as reviewed by me : EKG showed sinus bradycardia with a rate of 52 with right axis deviation lateral ST segment depression with T wave inversion in 1 mm ST segment elevation in lead III. Repeat EKG showed normal sinus rhythm with a rate of 74 and borderline right axis deviation . Imaging: Portable chest x-ray showed cardiomegaly and no acute cardiopulmonary disease.   The patient was given 2 L bolus of IV lactated  ringer.  The patient will be admitted to a cardiac telemetry observation bed at [facility] for further evaluation management.  I discussed the case with Dr. Duffy who will see the patient upon arrival.  I also notified Dr. Melia with nephrology.   IMPRESSION AND PLAN:  Assessment and Plan: Syncope Non-STEMI Hypotension Symptomatic anemia Due to respiratory failure End-stage renal disease   * Syncope -Multifactorial likely due to cardiogenic shock, orthostatic hypotension, hypovolemia, anemia - Monitoring closely, continue with neurochecks - Will check orthostatics q 12 hours. - Elevated Cardiac enzymes. Related to non-STEMI and significantly elevated troponins..... Cardiology consulted   - will Continue to monitored for arrhythmias. -Ruling out cardiogenic shock - orthostatic hypotension, due to cardiogenic shock, severe anemia ---neurally mediated syncope,  -Pending Echo     NSTEMI (non-ST elevated myocardial infarction) (HCC) - Though the patient did not have chest pain however and suspected third-degree AV block. - Was on IV heparin  and high-dose statin.... -Discontinued IV heparin  per cardiology due to drop in hemoglobin  -Troponin continues to elevate - 2D echo-pending  - Cardiology consult will be needed upon arrival to [facility]. - The case was discussed with  Dr.Chandrasekhar from cardiology at [facility]. -Discussed with cardiology team at [facility] - also amending ED to ED transfer now     Acute respiratory failure  - Possibly contributing from cardiogenic shock - Satting 86% on room air, initiating supplemental oxygen via nasal cannula - Continue O2 sat greater than 92% - Urine DuoNeb bronchodilators   hypotensive  Blood pressure running low, as low as 54/31, currently 86/72 - Ruling out cardiogenic shock, with non-STEMI - Sending IV fluids, 2U PRBC blood transfusion - Monitoring closely, discussing with PCCM may need pressors   Acute on chronic anemia - Hemoccult negative, iron  studies in progress  - Worsening anemia,     Latest Ref Rng & Units 02/10/2024    5:02 AM 02/09/2024    3:10 PM 02/09/2024    3:01 PM  CBC  WBC 4.0 - 10.5 K/uL 6.4  8.6    Hemoglobin 13.0 - 17.0 g/dL 6.7  7.5  8.8   Hematocrit 39.0 - 52.0 % 25.9  29.3  26.0   Platelets 150 - 400 K/uL 293  273     - Proceed with 2U PRBC blood transfusion now   Type 2 diabetes mellitus with peripheral neuropathy (HCC) - CBG every 4 hours, SSI coverage - Will continue Neurontin    ESRD on hemodialysis [facility]) - Nephrology consult will be needed for dialysis. - The patient is status post renal transplant. - Will continue Prograf , and Imuran  (holding for now) - Discussed with nephrologist Dr. Rayburn Maine with blood transfusion, IV fluid resuscitation, initiation of pressors needed        Assessment & Plan:   Principal Problem:   Syncope Active Problems:   NSTEMI (non-ST elevated myocardial infarction) (HCC)   ESRD on hemodialysis (HCC)   Type 2 diabetes mellitus with peripheral neuropathy (HCC)     Assessment and Plan: * Syncope - The patient will be admitted to an observation medically monitored bed at [facility]. - Will check orthostatics q 12 hours. - Will for more serious cardiac enzymes. -This could be related to non-STEMI and significantly elevated troponins.. - The patient will be monitored for arrhythmias. -Differential diagnoses would include cardiogenic, orthostatic hypotension, neurally mediated syncope, arrhythmias related, and less likely hypoglycemia.    NSTEMI (non-ST elevated myocardial infarction) (HCC) - Though the patient did not have chest pain however and suspected third-degree AV block. - Will place him on IV heparin  and high-dose statin. - Will follow serial troponins. - 2D echo was ordered. - Cardiology consult will be needed upon arrival to [facility]. - The case was discussed with Dr.Chandrasekhar from cardiology at [facility].  Type 2 diabetes mellitus with peripheral  neuropathy (HCC) - The patient will be placed on supplement coverage with NovoLog . - Will continue Neurontin   ESRD on hemodialysis [facility]) - Nephrology consult will be needed for dialysis. - The patient is status post renal transplant. - Will continue Prograf , and Imuran .   -------------------------------------------------------------------------------------------------------------- Nutritional status:  The patient's BMI is: Body mass index is 32.46 kg/m. I agree with the assessment and plan outlined..  Skin Assessment: I have examined the patient's skin and I agree with the wound assessment as performed by wound care team      ---------------------------------------------------------------------------------------------------------------------------------------------------- Cultures; Blood Cultures x 2 >> Urine Culture  >>>  Sputum Culture >>   -----------------------------------------------------------------------------------------------------------  DVT prophylaxis:  Heparin  drip-on hold now   Code Status:   Code Status: Full Code  Family Communication: No family member present at bedside-  -Advance care planning has been discussed.  Admission status:   Status is: Inpatient Remains inpatient appropriate because: Needing ICU admissions, for hypotension, symptomatic anemia needing blood transfusion, elevated troponin, non-STEMI, needing cardiology, nephrology input   Disposition: From  - home             Planning for discharge in 2-3 days  Procedures:   No admission procedures for hospital encounter.   Antimicrobials:  Anti-infectives (From admission, onward)    None        Medication:   sodium chloride    Intravenous Once   atorvastatin   80 mg Oral Daily   azaTHIOprine   150 mg Oral Daily   calcitRIOL   0.25 mcg Oral Daily   [START ON 02/14/2024] cloNIDine   0.3 mg Transdermal Q Sun   clopidogrel   75 mg Oral Daily   doxazosin   4 mg Oral BID    gabapentin   300 mg Oral QHS   hydrALAZINE   50 mg Oral TID   insulin  aspart  0-15 Units Subcutaneous TID WC   insulin  aspart  0-5 Units Subcutaneous QHS   latanoprost   1 drop Both Eyes QHS   multivitamin with minerals  1 tablet Oral Daily   pantoprazole   40 mg Oral Daily   sevelamer  carbonate  800 mg Oral TID WC   sodium chloride  flush  3 mL Intravenous Q12H   tacrolimus   3 mg Oral BID   Vitamin D  (Ergocalciferol )  50,000 Units Oral Q30 days   Vonoprazan Fumarate   1 tablet Oral Daily    acetaminophen  **OR** acetaminophen , furosemide , loperamide , magnesium  hydroxide, ondansetron  **OR** ondansetron  (ZOFRAN ) IV, oxyCODONE -acetaminophen , traZODone    Objective:   Vitals:   02/10/24 1145 02/10/24 1150 02/10/24 1155 02/10/24 1200  BP: (!) 89/56 (!) 96/50 111/81 (!) 86/72  Pulse:   (!) 48   Resp:   20 15  Temp:   97.7 F (36.5 C)   TempSrc:   Oral   SpO2:      Weight:      Height:        Intake/Output Summary (Last 24 hours) at 02/10/2024 1231 Last data filed at 02/10/2024 1139 Gross per 24 hour  Intake 2110.33 ml  Output --  Net 2110.33 ml   Filed Weights   02/09/24 1407  Weight: 94 kg     Physical examination:   General:  Somnolent, but arousable, responds to pain stimuli, some verbal,  HEENT:  Normocephalic, PERRL, otherwise with in Normal limits   Neuro:  CNII-XII intact. , normal motor and sensation, reflexes intact   Lungs:   Clear to auscultation BL, Respirations unlabored,  No wheezes / crackles  Cardio:    S1/S2, RRR, No murmure, No Rubs or Gallops   Abdomen:  Soft, non-tender, bowel sounds active all four quadrants, no guarding or peritoneal signs.  Muscular  skeletal:  Right BKA  Limited exam -global generalized weaknesses - in bed, able to move all 4 extremities,   2+ pulses,  symmetric, No pitting edema  Skin:  Dry, warm to touch, negative for any Rashes,  Wounds: Please see nursing documentation        ------------------------------------------------------------------------------------------------------------------------------------------    LABs:     Latest Ref Rng & Units 02/10/2024    5:02 AM 02/09/2024    3:10 PM 02/09/2024    3:01 PM  CBC  WBC 4.0 - 10.5 K/uL 6.4  8.6    Hemoglobin 13.0 - 17.0 g/dL 6.7  7.5  8.8   Hematocrit 39.0 - 52.0 % 25.9  29.3  26.0   Platelets  150 - 400 K/uL 293  273        Latest Ref Rng & Units 02/10/2024    5:02 AM 02/09/2024    3:10 PM 02/09/2024    3:01 PM  CMP  Glucose 70 - 99 mg/dL 849  755  739   BUN 8 - 23 mg/dL 38  32  38   Creatinine 0.61 - 1.24 mg/dL 4.00  4.45  4.29   Sodium 135 - 145 mmol/L 137  137  135   Potassium 3.5 - 5.1 mmol/L 3.6  3.7  5.0   Chloride 98 - 111 mmol/L 99  97  100   CO2 22 - 32 mmol/L 25  20    Calcium  8.9 - 10.3 mg/dL 8.1  8.4    Total Protein 6.5 - 8.1 g/dL  8.3    Total Bilirubin 0.0 - 1.2 mg/dL  0.8    Alkaline Phos 38 - 126 U/L  125    AST 15 - 41 U/L  33    ALT 0 - 44 U/L  13         Micro Results Recent Results (from the past 240 hours)  Culture, blood (routine x 2)     Status: None (Preliminary result)   Collection Time: 02/09/24  3:10 PM   Specimen: BLOOD  Result Value Ref Range Status   Specimen Description BLOOD BLOOD RIGHT ARM  Final   Special Requests   Final    BOTTLES DRAWN AEROBIC ONLY Blood Culture results may not be optimal due to an inadequate volume of blood received in culture bottles   Culture   Final    NO GROWTH < 24 HOURS Performed at [facility], [address] [place], [place] [address]    Report Status PENDING  Incomplete  Culture, blood (routine x 2)     Status: None (Preliminary result)   Collection Time: 02/09/24  3:10 PM   Specimen: BLOOD  Result Value Ref Range Status   Specimen Description BLOOD BLOOD RIGHT ARM  Final   Special Requests   Final    BOTTLES DRAWN AEROBIC ONLY Blood Culture results may not be optimal due to an inadequate volume of blood  received in culture bottles   Culture   Final    NO GROWTH < 24 HOURS Performed at [facility], [address] [place], [place] [address]    Report Status PENDING  Incomplete    Radiology Reports DG Chest Portable 1 View Result Date: 02/09/2024 CLINICAL DATA:  Weakness. EXAM: PORTABLE CHEST 1 VIEW COMPARISON:  09/01/2023. FINDINGS: Stable cardiomegaly. No overt pulmonary edema, focal consolidation, pleural effusion, or pneumothorax. Left axillary vascular stent. No acute osseous abnormality. IMPRESSION: 1. No acute cardiopulmonary findings. 2. Stable cardiomegaly. Electronically Signed   By: Harrietta Sherry M.D.   On: 02/09/2024 16:04    SIGNED: Adriana DELENA Grams, MD, FHM. FAAFP. [facility] - [facility] Critical care time spent - 75 min.  In seeing, evaluating and examining the patient. Reviewing medical records, labs, drawn plan of care.  Along with extensive discussion with consultants.    [facility],  Pager (please use [email or web address] to page/ text) Please use Epic Secure Chat for non-urgent communication (7AM-7PM) If 7PM-7AM, please contact night-coverage [email or web address], 02/10/2024, 12:31 PM

## 2024-02-10 NOTE — Discharge Instructions (Signed)
 Transport to the emergency department at [facility] Dr. Dreama accepting.

## 2024-02-10 NOTE — ED Notes (Signed)
Pt sleeping.  Chest rise and fall noted.

## 2024-02-10 NOTE — ED Notes (Signed)
 Per both Drs at bedside, continue blood transfusion. Pt slightly restless wanting to lie on left side. Is oriented but slow to respond still. 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

## 2024-02-10 NOTE — Progress Notes (Addendum)
 PHARMACY - ANTICOAGULATION CONSULT NOTE  Pharmacy Consult for heparin  Indication: chest pain/ACS  Allergies  Allergen Reactions   Bactrim  [Sulfamethoxazole -Trimethoprim ] Other (See Comments)    Runs pt's sugar and phosphorus   Doxycycline  Other (See Comments)    Runs up pt's blood sugar and phosphorus   Flagyl  [Metronidazole ] Hives and Rash    Patient having hives to either the vancomycin  or flagyl  (both infusing together).   Vancomycin  Hives and Rash    Patient having hives to either vancomycin  or flagyl     Patient Measurements: Height: 5' 7 (170.2 cm) Weight: 94 kg (207 lb 3.7 oz) IBW/kg (Calculated) : 66.1 HEPARIN  DW (KG): 86  Vital Signs: Temp: 97.9 F (36.6 C) (09/16 2145) Temp Source: Oral (09/16 2145) BP: 144/111 (09/17 0100) Pulse Rate: 69 (09/17 0100)  Labs: Recent Labs    02/09/24 1501 02/09/24 1510 02/09/24 1742  HGB 8.8* 7.5*  --   HCT 26.0* 29.3*  --   PLT  --  273  --   CREATININE 5.70* 5.54*  --   TROPONINIHS  --  3,329* 3,624*    Estimated Creatinine Clearance: 14.9 mL/min (A) (by C-G formula based on SCr of 5.54 mg/dL (H)).   Medical History: Past Medical History:  Diagnosis Date   Diabetes (HCC)    Diabetes mellitus without complication (HCC)    type 2   ESRD (end stage renal disease) (HCC)    03/09/2015- patient had a kidney transplant   Hypertension    Nausea vomiting and diarrhea 12/10/2015   Peripheral vascular disease (HCC)    Renal disorder    Renal insufficiency    Assessment: 72 yoM presented with weakness and near syncope. Pharmacy consulted to dose heparin  for ACS.  -Hgb 8.8 > 7.5, plts WNL -Trop 3600 -Received 5000 units heparin  subcutaneously  - No prior anticoagulation reported  Goal of Therapy:  Heparin  level 0.3-0.7 units/ml Monitor platelets by anticoagulation protocol: Yes   Plan:  Give 2000 units bolus x 1 given low Hgb, received [place] heparin   Start heparin  infusion at 1100 units/hr Check anti-Xa level in 8  hours and daily while on heparin  Continue to monitor H&H and platelets F/u cards consult  Lynwood Poplar, PharmD, BCPS Clinical Pharmacist 02/10/2024 1:42 AM

## 2024-02-11 ENCOUNTER — Inpatient Hospital Stay (HOSPITAL_COMMUNITY)

## 2024-02-11 DIAGNOSIS — I2609 Other pulmonary embolism with acute cor pulmonale: Secondary | ICD-10-CM | POA: Diagnosis not present

## 2024-02-11 DIAGNOSIS — I959 Hypotension, unspecified: Secondary | ICD-10-CM | POA: Diagnosis not present

## 2024-02-11 DIAGNOSIS — Z992 Dependence on renal dialysis: Secondary | ICD-10-CM | POA: Diagnosis not present

## 2024-02-11 DIAGNOSIS — I214 Non-ST elevation (NSTEMI) myocardial infarction: Secondary | ICD-10-CM | POA: Diagnosis not present

## 2024-02-11 DIAGNOSIS — R55 Syncope and collapse: Secondary | ICD-10-CM | POA: Diagnosis not present

## 2024-02-11 DIAGNOSIS — R001 Bradycardia, unspecified: Secondary | ICD-10-CM | POA: Diagnosis not present

## 2024-02-11 DIAGNOSIS — N186 End stage renal disease: Secondary | ICD-10-CM | POA: Diagnosis not present

## 2024-02-11 DIAGNOSIS — Z7189 Other specified counseling: Secondary | ICD-10-CM

## 2024-02-11 LAB — MRSA NEXT GEN BY PCR, NASAL: MRSA by PCR Next Gen: NOT DETECTED

## 2024-02-11 LAB — CBC
HCT: 32.4 % — ABNORMAL LOW (ref 39.0–52.0)
HCT: 32 % — ABNORMAL LOW (ref 39.0–52.0)
Hemoglobin: 9.1 g/dL — ABNORMAL LOW (ref 13.0–17.0)
Hemoglobin: 9.1 g/dL — ABNORMAL LOW (ref 13.0–17.0)
MCH: 24.7 pg — ABNORMAL LOW (ref 26.0–34.0)
MCH: 24.6 pg — ABNORMAL LOW (ref 26.0–34.0)
MCHC: 28.1 g/dL — ABNORMAL LOW (ref 30.0–36.0)
MCHC: 28.4 g/dL — ABNORMAL LOW (ref 30.0–36.0)
MCV: 87.8 fL (ref 80.0–100.0)
MCV: 86.5 fL (ref 80.0–100.0)
Platelets: 278 K/uL (ref 150–400)
Platelets: 275 K/uL (ref 150–400)
RBC: 3.69 MIL/uL — ABNORMAL LOW (ref 4.22–5.81)
RBC: 3.7 MIL/uL — ABNORMAL LOW (ref 4.22–5.81)
RDW: 16.8 % — ABNORMAL HIGH (ref 11.5–15.5)
RDW: 17 % — ABNORMAL HIGH (ref 11.5–15.5)
WBC: 10.9 K/uL — ABNORMAL HIGH (ref 4.0–10.5)
WBC: 12.4 K/uL — ABNORMAL HIGH (ref 4.0–10.5)
nRBC: 1.1 % — ABNORMAL HIGH (ref 0.0–0.2)
nRBC: 3.1 % — ABNORMAL HIGH (ref 0.0–0.2)

## 2024-02-11 LAB — HEMOGLOBIN AND HEMATOCRIT, BLOOD
HCT: 31.6 % — ABNORMAL LOW (ref 39.0–52.0)
Hemoglobin: 8.9 g/dL — ABNORMAL LOW (ref 13.0–17.0)

## 2024-02-11 LAB — GLUCOSE, CAPILLARY
Glucose-Capillary: 273 mg/dL — ABNORMAL HIGH (ref 70–99)
Glucose-Capillary: 258 mg/dL — ABNORMAL HIGH (ref 70–99)
Glucose-Capillary: 271 mg/dL — ABNORMAL HIGH (ref 70–99)
Glucose-Capillary: 198 mg/dL — ABNORMAL HIGH (ref 70–99)
Glucose-Capillary: 177 mg/dL — ABNORMAL HIGH (ref 70–99)

## 2024-02-11 LAB — BASIC METABOLIC PANEL WITH GFR
Anion gap: 22 — ABNORMAL HIGH (ref 5–15)
BUN: 46 mg/dL — ABNORMAL HIGH (ref 8–23)
CO2: 17 mmol/L — ABNORMAL LOW (ref 22–32)
Calcium: 8.3 mg/dL — ABNORMAL LOW (ref 8.9–10.3)
Chloride: 98 mmol/L (ref 98–111)
Creatinine, Ser: 7.09 mg/dL — ABNORMAL HIGH (ref 0.61–1.24)
GFR, Estimated: 8 mL/min — ABNORMAL LOW (ref >60)
Glucose, Bld: 289 mg/dL — ABNORMAL HIGH (ref 70–99)
Potassium: 4.8 mmol/L (ref 3.5–5.1)
Sodium: 137 mmol/L (ref 135–145)

## 2024-02-11 LAB — ECHOCARDIOGRAM LIMITED
AR max vel: 2.65 cm2
AV Peak grad: 10.5 mmHg
Ao pk vel: 1.62 m/s
Area-P 1/2: 3.77 cm2
Height: 67 in
S' Lateral: 3.2 cm
Weight: 3315.72 [oz_av]

## 2024-02-11 LAB — HEPATITIS B SURFACE ANTIGEN: Hepatitis B Surface Ag: NONREACTIVE

## 2024-02-11 LAB — TACROLIMUS LEVEL: Tacrolimus (FK506) - LabCorp: 1.2 ng/mL — ABNORMAL LOW (ref 5.0–20.0)

## 2024-02-11 LAB — TROPONIN I (HIGH SENSITIVITY): Troponin I (High Sensitivity): 6145 ng/L (ref <18)

## 2024-02-11 MED ORDER — MIDODRINE HCL 5 MG PO TABS
10.0000 mg | ORAL_TABLET | Freq: Three times a day (TID) | ORAL | Status: DC
  Administered 2024-02-11 (×3): 10 mg via ORAL
  Filled 2024-02-11 (×3): qty 2

## 2024-02-11 MED ORDER — MIDODRINE HCL 5 MG PO TABS
20.0000 mg | ORAL_TABLET | Freq: Three times a day (TID) | ORAL | Status: DC
  Administered 2024-02-11: 20 mg via ORAL
  Filled 2024-02-11: qty 4

## 2024-02-11 MED ORDER — ALBUMIN HUMAN 25 % IV SOLN
25.0000 g | INTRAVENOUS | Status: AC
  Administered 2024-02-12 (×2): 25 g via INTRAVENOUS
  Filled 2024-02-11: qty 100

## 2024-02-11 MED ORDER — LACTATED RINGERS IV BOLUS
500.0000 mL | Freq: Once | INTRAVENOUS | Status: AC
  Administered 2024-02-11: 500 mL via INTRAVENOUS

## 2024-02-11 MED ORDER — MIDODRINE HCL 5 MG PO TABS: 15.0000 mg | ORAL_TABLET | Freq: Three times a day (TID) | ORAL | Status: DC

## 2024-02-11 MED ORDER — ALBUMIN HUMAN 25 % IV SOLN
25.0000 g | Freq: Once | INTRAVENOUS | Status: AC
  Administered 2024-02-11: 25 g via INTRAVENOUS
  Filled 2024-02-11: qty 100

## 2024-02-11 MED ORDER — ALBUMIN HUMAN 25 % IV SOLN
25.0000 g | Freq: Once | INTRAVENOUS | Status: DC
  Filled 2024-02-11: qty 100

## 2024-02-11 MED ORDER — IOHEXOL 350 MG/ML SOLN
65.0000 mL | Freq: Once | INTRAVENOUS | Status: AC | PRN
  Administered 2024-02-11: 65 mL via INTRAVENOUS

## 2024-02-11 MED ORDER — MIDODRINE HCL 5 MG PO TABS: 20.0000 mg | ORAL_TABLET | Freq: Three times a day (TID) | ORAL | Status: DC

## 2024-02-11 MED FILL — Azathioprine Tab 50 MG: ORAL | Qty: 3 | Status: AC

## 2024-02-11 NOTE — Progress Notes (Addendum)
 Patient was seen for hypotension despite midodrine  and stress-dose steroids.   He is awake and oriented and denies lightheadedness or chest pain.     Plan to give albumin  and check CBC, chem panel, and lactic acid. Discussed with patient and his wife. Advised RN to use smaller BP cuff as current one too large for him.    Addendum: Pressures remain low despite appropriate size BP cuff and albumin . Labs have not yet resulted. Discussed the situation with the patient and his family who want transfer to ICU for vasopressors. Discussed the case with PCCM, Dr. Dub who kindly agreed to see the patient.

## 2024-02-11 NOTE — Progress Notes (Addendum)
 [facility] Progress Note   Subjective:  Seen in room earlier today - transferred from [facility] yesterday with NSTEMI. Cardiology has seen him now, possibly LHC after echo results. During my interview with him, he kept falling asleep, but wakes and normal speech. Denied CP/dyspnea. Reports that he usually has low BP. Midodrine  has been added today. Noted that his L AVF did NOT have bruit today. Was supposed to have dialysis today, but this is on hold due to lack of access. Spoke to cardiology attending afterwards- echo with very low EF, they are concerned for PE - plan is for CT angio today. Also, noted to have heart block. Per notes, has tried/failed to reach pt's wife.    Objective Vitals:   02/10/24 2327 02/11/24 0548 02/11/24 0734 02/11/24 1100  BP: (!) 99/56 (!) 72/51 (!) 90/58 110/60  Pulse: (!) 49 (!) 44 (!) 51 82  Resp: 17 20 20 18   Temp: 97.6 F (36.4 C) 97.6 F (36.4 C) (!) 97.4 F (36.3 C) 97.6 F (36.4 C)  TempSrc: Oral Oral Oral Tympanic  SpO2: 100% 100% 100% 98%  Weight:  92.8 kg    Height:       Physical Exam General: Chronically ill appearing man, NAD. Falls asleep easily Heart: Irregular, no murmur Lungs: CTA laterally Abdomen: distended, but soft Extremities: L BKA, no edema Dialysis Access: LUE AVF without thrill/bruit  Additional Objective Labs: Basic Metabolic Panel: Recent Labs  Lab 02/09/24 1457 02/09/24 1501 02/09/24 1510 02/10/24 0502 02/11/24 0434  NA  --    < > 137 137 137  K  --    < > 3.7 3.6 4.8  CL  --    < > 97* 99 98  CO2  --   --  20* 25 17*  GLUCOSE  --    < > 244* 150* 289*  BUN  --    < > 32* 38* 46*  CREATININE  --    < > 5.54* 5.99* 7.09*  CALCIUM   --   --  8.4* 8.1* 8.3*  PHOS 5.3*  --   --   --   --    < > = values in this interval not displayed.   Liver Function Tests: Recent Labs  Lab 02/09/24 1510  AST 33  ALT 13  ALKPHOS 125  BILITOT 0.8  PROT 8.3*  ALBUMIN  2.3*   CBC: Recent Labs  Lab  02/04/24 1725 02/09/24 1501 02/09/24 1510 02/10/24 0502 02/10/24 1816 02/10/24 2357 02/11/24 0858  WBC 7.1  --  8.6 6.4  --   --  10.9*  NEUTROABS 5.0  --  5.9  --   --   --   --   HGB 7.5*   < > 7.5* 6.7* 9.4* 8.9* 9.1*  HCT 26.7*   < > 29.3* 25.9* 33.1* 31.6* 32.4*  MCV 82  --  90.7 87.8  --   --  87.8  PLT 343  --  273 293  --   --  278   < > = values in this interval not displayed.   Blood Culture    Component Value Date/Time   SDES BLOOD BLOOD RIGHT ARM 02/09/2024 1510   SDES BLOOD BLOOD RIGHT ARM 02/09/2024 1510   SPECREQUEST  02/09/2024 1510    BOTTLES DRAWN AEROBIC ONLY Blood Culture results may not be optimal due to an inadequate volume of blood received in culture bottles   SPECREQUEST  02/09/2024 1510    BOTTLES DRAWN  AEROBIC ONLY Blood Culture results may not be optimal due to an inadequate volume of blood received in culture bottles   CULT  02/09/2024 1510    NO GROWTH 2 DAYS Performed at [facility], [address] [place], [place] [address]    CULT  02/09/2024 1510    NO GROWTH 2 DAYS Performed at [facility], [address] [place], [place] [address]    REPTSTATUS PENDING 02/09/2024 1510   REPTSTATUS PENDING 02/09/2024 1510   CBG: Recent Labs  Lab 02/10/24 1708 02/10/24 2107 02/11/24 0558 02/11/24 0740 02/11/24 1145  GLUCAP 78 249* 273* 258* 271*   Iron Studies:  Recent Labs    02/10/24 0646 02/10/24 1816  IRON  --  77  TIBC  --  155*  FERRITIN 4,509*  --    Studies/Results: ECHOCARDIOGRAM LIMITED Result Date: 02/11/2024    ECHOCARDIOGRAM LIMITED REPORT   Patient Name:   Raymond [name] [facility] Date of Exam: 02/10/2024 Medical Rec #:  [id number]               Height:       67.0 in Accession #:    [id number]              Weight:       207.2 lb Date of Birth:  1960-07-12               BSA:          2.053 m Patient Age:    63 years                BP:           121/98 mmHg Patient Gender: M                       HR:           57 bpm. Exam Location:   Inpatient Procedure: Limited Echo and Limited Color Doppler (Both Spectral and Color Flow            Doppler were utilized during procedure). Indications:    NSTEMI I21.4  History:        Patient has prior history of Echocardiogram examinations, most                 recent 07/10/2022. Previous Myocardial Infarction, PAD and CKD,                 Signs/Symptoms:Syncope; Risk Factors:Hypertension and Diabetes.  Sonographer:    Thea Norlander RCS Referring Phys: [id number] GORDY BERGAMO  Sonographer Comments: Image acquisition challenging due to uncooperative patient. IMPRESSIONS  1. Left ventricular ejection fraction, by estimation, is 25 to 30%. The left ventricle has severely decreased function. The left ventricle demonstrates regional wall motion abnormalities (see scoring diagram/findings for description). The left ventricular internal cavity size was severely dilated. There is moderate asymmetric left ventricular hypertrophy of the septal segment. Left ventricular diastolic parameters are consistent with Grade II diastolic dysfunction (pseudonormalization). Elevated left ventricular end-diastolic pressure. There is the interventricular septum is flattened in systole and diastole, consistent with right ventricular pressure and volume overload.  2. Right ventricular systolic function is moderately reduced. The right ventricular size is normal. There is normal pulmonary artery systolic pressure.  3. The mitral valve is normal in structure. Trivial mitral valve regurgitation. No evidence of mitral stenosis.  4. The aortic valve is tricuspid. Aortic valve regurgitation is not visualized. No aortic stenosis is present.  5. The inferior  vena cava is dilated in size with <50% respiratory variability, suggesting right atrial pressure of 15 mmHg. Comparison(s): Changes from prior study are noted. The left ventricular function is significantly worse. FINDINGS  Left Ventricle: Left ventricular ejection fraction, by estimation, is 25  to 30%. The left ventricle has severely decreased function. The left ventricle demonstrates regional wall motion abnormalities. The left ventricular internal cavity size was severely dilated. There is moderate asymmetric left ventricular hypertrophy of the septal segment. The interventricular septum is flattened in systole and diastole, consistent with right ventricular pressure and volume overload. Left ventricular diastolic parameters are consistent with Grade II diastolic dysfunction (pseudonormalization). Elevated left ventricular end-diastolic pressure.  LV Wall Scoring: There is diffuse hypokinesis. Right Ventricle: The right ventricular size is normal. No increase in right ventricular wall thickness. Right ventricular systolic function is moderately reduced. There is normal pulmonary artery systolic pressure. The tricuspid regurgitant velocity is 1.90 m/s, and with an assumed right atrial pressure of 15 mmHg, the estimated right ventricular systolic pressure is 29.4 mmHg. Left Atrium: Left atrial size was normal in size. Right Atrium: Right atrial size was normal in size. Pericardium: There is no evidence of pericardial effusion. Mitral Valve: The mitral valve is normal in structure. Trivial mitral valve regurgitation. No evidence of mitral valve stenosis. Tricuspid Valve: The tricuspid valve is normal in structure. Tricuspid valve regurgitation is mild . No evidence of tricuspid stenosis. Aortic Valve: The aortic valve is tricuspid. Aortic valve regurgitation is not visualized. No aortic stenosis is present. Aortic valve peak gradient measures 10.5 mmHg. Pulmonic Valve: The pulmonic valve was normal in structure. Pulmonic valve regurgitation is trivial. No evidence of pulmonic stenosis. Aorta: The aortic root is normal in size and structure. Venous: The inferior vena cava is dilated in size with less than 50% respiratory variability, suggesting right atrial pressure of 15 mmHg. IAS/Shunts: No atrial level  shunt detected by color flow Doppler. LEFT VENTRICLE PLAX 2D LVIDd:         4.40 cm   Diastology LVIDs:         3.20 cm   LV e' medial:    6.09 cm/s LV PW:         0.80 cm   LV E/e' medial:  16.1 LV IVS:        1.40 cm   LV e' lateral:   8.92 cm/s LVOT diam:     2.10 cm   LV E/e' lateral: 11.0 LV SV:         63 LV SV Index:   31 LVOT Area:     3.46 cm  RIGHT VENTRICLE            IVC RV S prime:     8.38 cm/s  IVC diam: 3.00 cm TAPSE (M-mode): 1.1 cm LEFT ATRIUM         Index LA diam:    4.50 cm 2.19 cm/m  AORTIC VALVE AV Area (Vmax): 2.65 cm AV Vmax:        162.00 cm/s AV Peak Grad:   10.5 mmHg LVOT Vmax:      124.00 cm/s LVOT Vmean:     75.800 cm/s LVOT VTI:       0.183 m  AORTA Ao Root diam: 3.80 cm Ao Asc diam:  3.50 cm MITRAL VALVE               TRICUSPID VALVE MV Area (PHT): 3.77 cm    TR Peak grad:   14.4 mmHg MV  Decel Time: 201 msec    TR Vmax:        190.00 cm/s MV E velocity: 98.00 cm/s MV A velocity: 36.20 cm/s  SHUNTS MV E/A ratio:  2.71        Systemic VTI:  0.18 m                            Systemic Diam: 2.10 cm Annabella Scarce MD Electronically signed by Annabella Scarce MD Signature Date/Time: 02/11/2024/11:03:34 AM    Final    DG Chest Portable 1 View Result Date: 02/09/2024 CLINICAL DATA:  Weakness. EXAM: PORTABLE CHEST 1 VIEW COMPARISON:  09/01/2023. FINDINGS: Stable cardiomegaly. No overt pulmonary edema, focal consolidation, pleural effusion, or pneumothorax. Left axillary vascular stent. No acute osseous abnormality. IMPRESSION: 1. No acute cardiopulmonary findings. 2. Stable cardiomegaly. Electronically Signed   By: Harrietta Sherry M.D.   On: 02/09/2024 16:04   Medications:   atorvastatin   80 mg Oral Daily   azaTHIOprine   150 mg Oral Daily   calcitRIOL   0.25 mcg Oral Daily   gabapentin   300 mg Oral QHS   hydrocortisone  sod succinate (SOLU-CORTEF ) inj  100 mg Intravenous Q8H   insulin  aspart  0-15 Units Subcutaneous TID WC   insulin  aspart  0-5 Units Subcutaneous QHS    latanoprost   1 drop Both Eyes QHS   midodrine   10 mg Oral TID WC   multivitamin with minerals  1 tablet Oral Daily   pantoprazole   40 mg Oral Daily   sevelamer  carbonate  800 mg Oral TID WC   sodium chloride  flush  3 mL Intravenous Q12H   tacrolimus   3 mg Oral BID   Vitamin D  (Ergocalciferol )  50,000 Units Oral Q30 days    Dialysis Orders [facility] - TTS  3hr, 2K/2.5Ca bath, EDW 92kg, 350/500, LUE AVF, heparin  1000 unit + 500/hr pump  Assessment/Plan: Elevated troponin: 3329 -> 6145 this AM. Has never had LHC, so CAD not documented, known PAD. Echo this AM with new HF and overload, cardiology is concerned about massive PE. CT angio pending. Also with new heart block, hypotension, and anemia. Not on IV heparin  at this time. Overall, he is not looking well. Await CT for further plan. ESRD: S/p DDKT failure, HD started 12/2023 - remains on IS regimen. Usual TTS schedule. Cannot dialyze today, L AVF is clotted. Will see about getting HD line - at current state cannot tolerate a declot. IR consulted for temp line placement. Fortunately, nothing is critical at the moment, so hopefully we can push of HD until tomorrow. HypoTN/volume: Quite hypotensive this AM, although asymptomatic. Midodrine  has been added - seems to be helping. Anemia of ESRD v. GIB: S/p 2U PRBCs, Hgb better today. Will clarify ESA with outpatient HD unit. Secondary HPTH: Ca/Phos ok. Continue home meds for now. PAD T2DM Hx DDKT 2016: Need to clarify IS regimen with his family. Per notes, on 8/6 he stopped CellCept , tacro reduced from 5mg  BID to 3mg  BID, and pred reduced to 5mg  daily. ?Unclear about azathioprine  Recent diarrheal illness   Izetta Broadus RIGGERS 02/11/2024, 1:38 PM  [facility]

## 2024-02-11 NOTE — TOC Initial Note (Addendum)
 Transition of Care [facility]) - Initial/Assessment Note    Patient Details  Name: Raymond [name] MRN: [id number] Date of Birth: 1960/06/30  Transition [facility] CM/SW Contact:    Sudie Erminio Deems, RN Phone Number: 02/11/2024, 11:25 AM  Clinical Narrative: Risk for readmission assessment completed. Patient presented for syncope and collapse. PTA patient was from home with spouse. Sister was at the bedside during the visit. Patient has DME; CPAP, rolling walker, wheelchair, and cane. Patient states he has a PCP and drives himself to appointments. Patient has hx ESRD-TTS. Patient states he was experiencing weakness and asked if he could get PT/O-awaiting recommendations. Inpatient Case Manager will continue to follow for additional disposition needs.                 Expected Discharge Plan: Home w Home Health Services Barriers to Discharge: Continued Medical Work up   Patient Goals and CMS Choice Patient states their goals for this hospitalization and ongoing recovery are:: Patient wants to work with PT so he can feel better and not be so weak.   Choice offered to / list presented to : NA      Expected Discharge Plan and Services In-house Referral: NA Discharge Planning Services: CM Consult Post Acute Care Choice: Home Health Living arrangements for the past 2 months: Single Family Home                   DME Agency: NA  Prior Living Arrangements/Services Living arrangements for the past 2 months: Single Family Home Lives with:: Spouse Patient language and need for interpreter reviewed:: Yes Do you feel safe going back to the place where you live?: Yes      Need for Family Participation in Patient Care: Yes (Comment) Care giver support system in place?: Yes (comment) Current home services: DME (CPAP, rolling walker, wheelchair, and cane.) Criminal Activity/Legal Involvement Pertinent to Current Situation/Hospitalization: No - Comment as needed  Activities of  Daily Living   ADL Screening (condition at time of admission) Independently performs ADLs?: Yes (appropriate for developmental age) Is the patient deaf or have difficulty hearing?: No Does the patient have difficulty seeing, even when wearing glasses/contacts?: No Does the patient have difficulty concentrating, remembering, or making decisions?: No  Permission Sought/Granted Permission sought to share information with : Family Supports, Case Manager   Emotional Assessment Appearance:: Appears stated age Attitude/Demeanor/Rapport: Engaged Affect (typically observed): Appropriate Orientation: : Oriented to Self, Oriented to Place, Oriented to  Time Alcohol / Substance Use: Not Applicable Psych Involvement: No (comment)  Admission diagnosis:  Dehydration [E86.0] Complete heart block (HCC) [I44.2] Syncope [R55] End stage renal disease on dialysis (HCC) [N18.6, Z99.2] Patient Active Problem List   Diagnosis Date Noted   NSTEMI (non-ST elevated myocardial infarction) (HCC) 02/10/2024   Type 2 diabetes mellitus with peripheral neuropathy (HCC) 02/10/2024   Junctional bradycardia 02/10/2024   Elevated troponin 02/10/2024   Shock (HCC) 02/10/2024   Syncope and collapse 02/09/2024   Encounter for Medicare annual wellness exam 01/31/2024   Bilateral hand pain 01/31/2024   Elevated serum immunoglobulin free light chains 11/06/2023   Fall 09/07/2023   DM type 2 causing ESRD (HCC) 09/07/2023   Elevated ferritin 09/04/2023   Hypoglycemia 09/04/2023   Pyogenic arthritis of right hip (HCC) 09/03/2023   Medication management 09/03/2023   History of right below knee amputation (HCC) 06/18/2023   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) 06/08/2023   ESRD (end stage renal disease) (HCC) 06/08/2023  Primary hypertension 06/08/2023   Infectious arthritis (HCC) 06/07/2023   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/2023   AKI (acute kidney injury) (HCC) 03/24/2023   Perinephric  hematoma of transplanted kidney 03/24/2023   Acute blood loss anemia 03/24/2023   History of bleeding following renal biopsy 03/24/2023   Sinus drainage 03/24/2023   COVID-19 vaccination declined 03/24/2023   Class 1 obesity due to excess calories with serious comorbidity and body mass index (BMI) of 32.0 to 32.9 in adult 03/24/2023   Need for influenza vaccination 03/24/2023   Dermatitis 11/13/2022   Class 2 obesity due to excess calories with body mass index (BMI) of 37.0 to 37.9 in adult 11/13/2022   Rash 11/13/2022   Other fatigue 10/16/2022   Acquired absence of right leg below knee (HCC) 06/18/2022   CKD (chronic kidney disease) 06/18/2022   Cutaneous abscess of right foot    Hyperkalemia 08/28/2021   Obesity (BMI 30-39.9) 08/28/2021   History of anemia due to chronic kidney disease 08/28/2021   Diabetic foot ulcer (HCC) 08/14/2021   Hypomagnesemia 06/25/2021   PAD (peripheral artery disease) (HCC) 04/24/2020   Thrombophlebitis of superficial veins of right lower extremity    Difficult intravenous access    MRSA bacteremia    Diabetic foot infection (HCC)    Infected wound 01/12/2020   Critical lower limb ischemia (HCC) 02/02/2019   Personal history of noncompliance with medical treatment, presenting hazards to health 10/07/2018   Critical limb ischemia with history of revascularization of same extremity (HCC) 07/27/2018   Mixed hyperlipidemia 02/19/2018   History of renal transplant 02/19/2018   Bilateral hearing loss 08/31/2017   Nuclear sclerotic cataract of right eye 10/07/2016   Pseudophakia of left eye 10/07/2016   Hypertension due to endocrine disorder 03/04/2016   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 (HCC) 03/04/2016   Renal failure 03/04/2016   Proliferative diabetic retinopathy with macular edema associated with type 2 diabetes mellitus (HCC) 03/04/2016   Immunosuppression (HCC) 12/10/2015    Nausea vomiting and diarrhea 12/10/2015   Type 2 diabetes mellitus with stage 3b chronic kidney disease, without long-term current use of insulin  (HCC) 12/10/2015   Benign hypertension with chronic kidney disease 12/10/2015   Anemia 12/10/2015   Encounter for screening colonoscopy 11/15/2014   GERD (gastroesophageal reflux disease) 11/15/2014   PCP:  Georgina Speaks, FNP Pharmacy:   [facility] [address], [place] - [address] [place] [address] Phone: [phone] Fax: [phone]  [facility] - [place], [place] - [address] [employer] [place] [address] Phone: [phone] Fax: [phone]  [facility] - [place], [place] - [name]  [address] [place] [address] Phone: [phone] Fax: [phone]  Social Drivers of Health (SDOH) Social History: SDOH Screenings   Food Insecurity: No Food Insecurity (02/10/2024)  Housing: Low Risk  (02/10/2024)  Transportation Needs: No Transportation Needs (02/10/2024)  Utilities: Not At Risk (02/10/2024)  Alcohol Screen: Low Risk  (01/21/2024)  Depression (PHQ2-9): Low Risk  (02/04/2024)  Financial Resource Strain: Low Risk  (01/21/2024)  Physical Activity: Sufficiently Active (01/21/2024)  Social Connections: Moderately Integrated (01/21/2024)  Stress: Stress Concern Present (01/21/2024)  Tobacco Use: Low Risk  (02/09/2024)  Health Literacy: Inadequate Health Literacy (01/21/2024)   Readmission Risk Interventions    02/11/2024   11:22 AM 06/09/2023    4:26 PM 06/08/2023    4:25 PM  Readmission Risk Prevention  Plan  Transportation Screening Complete Complete Complete  HRI or Home Care Consult Complete Complete Complete  Social Work Consult for Recovery Care Planning/Counseling Complete Complete Complete  Palliative Care Screening Not Applicable Not Applicable Not Applicable  Medication Review (RN Care Manager) Referral to Pharmacy Complete Complete

## 2024-02-11 NOTE — Progress Notes (Signed)
 PROGRESS NOTE Raymond [name]  FMW:[id number] DOB: [birth year] DOA: 02/09/2024 PCP: Georgina Speaks, FNP  Brief Narrative/Hospital Course: 63 y.o. African-American male with medical history significant for T2DM, ESRD on HD, HTN, who has been having diarrhea over the last few weeks and experienced syncope in his dialysis center,was noted to have hypotension with blood pressure in the 50 w/o chest pain or palpitations,cough or wheezing or dyspnea.e has been having dizziness and generalized weakness.   In ED: BP was 151/39.Labs revealed CO2 of 20 and blood Kos of 244 chloride 97 with a BUN of 32 and creatinine 5.54 calcium  8.4 with anion gap of 20.  Albumin  was 2.3 total protein was 8.3.  High-sensitivity troponin 3329 and later 3624. EKG sinus bradycardia with a rate of 52 with right axis deviation lateral ST segment depression with T wave inversion in 1 mm ST segment elevation in lead III. Repeat EKG showed normal sinus rhythm with a rate of 74 and borderline right axis deviation  Cxr>cardiomegaly and no acute cardiopulmonary disease. s/p 2 L bolus of IV lactated ringer .  Concern for brief third-degree block that resolved with improved blood pressure, cardiology consulted.  Patient also given 2 unit PRBC as hemoglobin was low.  Patient was transferred from ED to ED to be admitted to [facility].  Patient was seen by nephrology   Subjective: Seen and examined today He was alert awake resting comfortably sister at the bedside Overnight BP remains soft in 70s-150s, heart rate 40s to 60s, on room air Labs this morning BUN 46 creatinine 7.0 hemoglobin at 8.9, recheck TROP 3000s>9053> 4665 Denies chest pain nausea vomiting shortness of breath.   Assessment and plan:   Syncope Junctional escape rhythm/CHB Prolonged QTc Hypotension New onset systolic CHF: Multifactorial etiology likely suspect cardiogenic, also with ESRD, significant anemia.Received liter bolus RL and 2 unit PRBC.  BP remains  soft,Cardiology following.  Echo showing new systolic dysfunction as below.   Added midodrine  10 mg tid.  He was recently on oral steroid, so added stress dose steroids IV hydrocortisone   NSTEMI New onset systolic CHF Suspected CAD Suspected brief third-degree AV block/and junctional escape rhythm: No CHEST pain, TROP 3000s>9053> 4665. Cardio has been consulted initially plan for heparin  drip but held due to significant anemia Unable to use beta-blocker due to hypotension.  Continue statin 2D echo-showed EF 25-30% LV severely dilated, G2 DD  GDMT limited due to hypertension ESRD, continue per cardiology Noted plan for medical management, high risk for invasive procedure. Getting CT angio chest per cardio.  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 He is s/p transplant on Prograf , and Imuran .  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 Continue medical management  Acute on chronic anemia Hemoccult  pending , iron studies-fairly stable previously iron deficiency in June.  Got 2 unit PRBC hemoglobin stable,trend. Recent Labs    09/04/23 1143 11/02/23 1109 02/04/24 1725 02/09/24 1510 02/10/24 0502 02/10/24 0646 02/10/24 1816 02/10/24 2357 02/11/24 0858  HGB 9.9* 12.0*   < > 7.5* 6.7*  --  9.4* 8.9* 9.1*  MCV  81.4 83.2   < > 90.7 87.8  --   --   --  87.8  VITAMINB12 532 551  --   --   --   --   --   --   --   FOLATE 16.3 >40.0  --   --   --   --   --   --   --   FERRITIN 241  538*  --   --   --  4,509*  --   --   --   TIBC 251 218*  --   --   --   --  155*  --   --   IRON 33* 44*  --   --   --   --  77  --   --    < > = values in this interval not displayed.    T2DM W/ neuropathy with uncontrolled hyperglycemia: Continue SSI every 4 hours, continue Neurontin  Recent Labs  Lab 02/10/24 1708 02/10/24 2107 02/11/24 0558 02/11/24 0740 02/11/24 1145  GLUCAP 78 249* 273* 258* 271*   Recent diarrheal illness: Stool studies pending.  Class II Obesity w/ Body mass index is 32.04 kg/m.: Will benefit with PCP follow-up, weight loss,healthy lifestyle and outpatient sleep eval if not done.  Goals of Care: Cardiology to discuss GOC with patient this morning see their note. Continue medical management, patient is high risk for decompensation and high risk for readmission, he will benefit with palliative care evaluation at some point  Mobility: PT OT consulted  DVT prophylaxis: SCD no anticoagulation due to severe bleeding Code Status:   Code Status: Limited: Do not attempt resuscitation (DNR) -DNR-LIMITED -Do Not Intubate/DNI  Family Communication: plan of care discussed with patient at bedside. Patient status is: Remains hospitalized because of severity of illness Level of care: Telemetry Cardiac   Dispo: The patient is from: home            Anticipated disposition: TBD Objective: Vitals last 24 hrs: Vitals:   02/10/24 2327 02/11/24 0548 02/11/24 0734 02/11/24 1100  BP: (!) 99/56 (!) 72/51 (!) 90/58 110/60  Pulse: (!) 49 (!) 44 (!) 51 82  Resp: 17 20 20 18   Temp: 97.6 F (36.4 C) 97.6 F (36.4 C) (!) 97.4 F (36.3 C) 97.6 F (36.4 C)  TempSrc: Oral Oral Oral Tympanic  SpO2: 100% 100% 100% 98%  Weight:  92.8 kg    Height:        Physical Examination: General exam: alert awake, oriented, older than stated age HEENT:Oral mucosa moist, Ear/Nose WNL grossly Respiratory system: Bilaterally clear BS,no use of accessory muscle Cardiovascular system:  S1 & S2 +, No JVD. Gastrointestinal system: Abdomen soft, obese, NT,ND, BS+ Nervous System: Alert, awake, moving all extremities,and following commands. Extremities: extremities warm, leg edema mild Skin: No rashes,no icterus. MSK: Normal muscle bulk,tone, power   Medications reviewed:  Scheduled Meds:  atorvastatin   80 mg Oral Daily   azaTHIOprine   150 mg Oral Daily   calcitRIOL   0.25 mcg Oral Daily   gabapentin   300 mg Oral QHS   hydrocortisone  sod succinate (SOLU-CORTEF ) inj  100 mg Intravenous Q8H   insulin  aspart  0-15 Units Subcutaneous TID WC   insulin  aspart  0-5 Units Subcutaneous QHS   latanoprost   1 drop Both Eyes QHS   midodrine   10 mg Oral TID WC   multivitamin with minerals  1 tablet Oral Daily   pantoprazole   40 mg Oral Daily   sevelamer  carbonate  800 mg Oral TID WC   sodium chloride  flush  3 mL Intravenous Q12H   tacrolimus   3 mg Oral BID   Vitamin D  (Ergocalciferol )  50,000 Units Oral Q30 days   Continuous Infusions: Diet: Diet Order             Diet Carb Modified Fluid consistency: Thin; Room service appropriate? Yes  Diet effective now                    Data Reviewed: I have personally reviewed following labs and imaging studies ( see epic result tab) CBC: Recent Labs  Lab 02/04/24 1725 02/09/24 1501 02/09/24 1510 02/10/24 0502 02/10/24 1816 02/10/24 2357 02/11/24 0858  WBC 7.1  --  8.6 6.4  --   --  10.9*  NEUTROABS 5.0  --  5.9  --   --   --   --   HGB 7.5*   < > 7.5* 6.7* 9.4* 8.9* 9.1*  HCT 26.7*   < > 29.3* 25.9* 33.1* 31.6* 32.4*  MCV 82  --  90.7 87.8  --   --  87.8  PLT 343  --  273 293  --   --  278   < > = values in this interval not displayed.   CMP: Recent Labs  Lab 02/09/24 1457 02/09/24 1501 02/09/24 1510 02/10/24 0502 02/11/24 0434  NA  --  135 137 137 137  K  --  5.0 3.7 3.6 4.8  CL  --  100 97* 99 98  CO2  --   --  20* 25 17*  GLUCOSE  --  260* 244* 150* 289*  BUN  --  38* 32* 38* 46*  CREATININE  --  5.70*  5.54* 5.99* 7.09*  CALCIUM   --   --  8.4* 8.1* 8.3*  MG  --   --  2.1  --   --   PHOS 5.3*  --   --   --   --    GFR: Estimated Creatinine Clearance: 11.6 mL/min (A) (by C-G formula based on SCr of 7.09 mg/dL (H)). Recent Labs  Lab 02/09/24 1510  AST 33  ALT 13  ALKPHOS 125  BILITOT 0.8  PROT 8.3*  ALBUMIN  2.3*   No results for input(s): LIPASE, AMYLASE in the last 168 hours. No results for input(s): AMMONIA in the last 168 hours. Coagulation Profile: No results for input(s): INR, PROTIME in the last 168 hours. Unresulted Labs (From admission, onward)     Start     Ordered   03/05/24 0500  Lipid panel  Tomorrow morning,   R       Question:  Specimen collection method  Answer:  Lab=Lab collect   02/11/24 0819   02/11/24 0744  Occult blood card to lab, stool  ONCE - STAT,   STAT        02/11/24 0743   02/11/24 0500  Occult blood card to lab, stool  Daily,   R      02/10/24 1434   02/11/24 0500  Basic metabolic panel with GFR  Daily,   R      02/10/24 1512   02/11/24 0500  CBC  Daily,   R      02/10/24 1512   02/09/24 1428  Urinalysis, Routine w reflex microscopic -Urine, Clean Catch  Once,   R       Question:  Specimen Source  Answer:  Urine, Clean Catch   02/09/24 1427  Antimicrobials/Microbiology: Anti-infectives (From admission, onward)    None         Component Value Date/Time   SDES BLOOD BLOOD RIGHT ARM 02/09/2024 1510   SDES BLOOD BLOOD RIGHT ARM 02/09/2024 1510   SPECREQUEST  02/09/2024 1510    BOTTLES DRAWN AEROBIC ONLY Blood Culture results may not be optimal due to an inadequate volume of blood received in culture bottles   SPECREQUEST  02/09/2024 1510    BOTTLES DRAWN AEROBIC ONLY Blood Culture results may not be optimal due to an inadequate volume of blood received in culture bottles   CULT  02/09/2024 1510    NO GROWTH 2 DAYS Performed at [facility], [address], [place], [place] [address]    CULT  02/09/2024 1510    NO  GROWTH 2 DAYS Performed at [facility], [address] [place], [place] [address]    REPTSTATUS PENDING 02/09/2024 1510   REPTSTATUS PENDING 02/09/2024 1510  Procedures: Mennie LAMY, MD [facility] 02/11/2024, 1:49 PM

## 2024-02-11 NOTE — Progress Notes (Signed)
 RN unable to obtain  accurate vitals on patient. Provider came beside and ordered albumin , currently running. Pt is aox3. RN expressed concerns to provider that pt needs higher level of care in order to obtain accurate vitals particularly the blood pressure. Provider suggested smaller cuff.BP is still significantly low. Primary RN and charge RN are unable to obtain manual BP, this was communicated multiple time to provider. Plan of care ongoing

## 2024-02-11 NOTE — Plan of Care (Signed)

## 2024-02-11 NOTE — Inpatient Diabetes Management (Addendum)
 Inpatient Diabetes Program Recommendations  AACE/ADA: New Consensus Statement on Inpatient Glycemic Control (2015)  Target Ranges:  Prepandial:   less than 140 mg/dL      Peak postprandial:   less than 180 mg/dL (1-2 hours)      Critically ill patients:  140 - 180 mg/dL   Lab Results  Component Value Date   GLUCAP 258 (H) 02/11/2024   HGBA1C 8.1 (H) 01/21/2024    Review of Glycemic Control  Latest Reference Range & Units 02/10/24 08:52 02/10/24 10:42 02/10/24 12:19 02/10/24 17:08 02/10/24 21:07 02/11/24 05:58 02/11/24 07:40  Glucose-Capillary 70 - 99 mg/dL 865 (H) 863 (H) 867 (H) 78 249 (H) 273 (H) 258 (H)   Diabetes history: DM 2 Outpatient Diabetes medications: Dexcom G7, Trulicity  3 mg QSunday, Humalog  5-10 units tid Current orders for Inpatient glycemic control:  Novolog  0-15 units tid + hs A1c 8.1% on 8/28 Solucortef 100 mg Q8 hours (started yesterday 9/17)  Inpatient Diabetes Program Recommendations:    -   If steroid dose remains the same, consider adding Lantus  6 units  Thanks,  Clotilda Bull RN, MSN, BC-ADM Inpatient Diabetes Coordinator Team Pager [phone] (8a-5p)

## 2024-02-11 NOTE — Plan of Care (Signed)
 Problem: Education: Goal: Knowledge of condition and prescribed therapy will improve 02/11/2024 1509 by Mila Fairy SAUNDERS, RN Outcome: Progressing 02/11/2024 1509 by Mila Fairy SAUNDERS, RN Outcome: Progressing   Problem: Cardiac: Goal: Will achieve and/or maintain adequate cardiac output 02/11/2024 1509 by Mila Fairy SAUNDERS, RN Outcome: Progressing 02/11/2024 1509 by Mila Fairy SAUNDERS, RN Outcome: Progressing   Problem: Physical Regulation: Goal: Complications related to the disease process, condition or treatment will be avoided or minimized 02/11/2024 1509 by Mila Fairy SAUNDERS, RN Outcome: Progressing 02/11/2024 1509 by Mila Fairy SAUNDERS, RN Outcome: Progressing   Problem: Education: Goal: Ability to describe self-care measures that may prevent or decrease complications (Diabetes Survival Skills Education) will improve 02/11/2024 1509 by Mila Fairy SAUNDERS, RN Outcome: Progressing 02/11/2024 1509 by Mila Fairy SAUNDERS, RN Outcome: Progressing Goal: Individualized Educational Video(s) 02/11/2024 1509 by Mila Fairy SAUNDERS, RN Outcome: Progressing 02/11/2024 1509 by Mila Fairy SAUNDERS, RN Outcome: Progressing   Problem: Coping: Goal: Ability to adjust to condition or change in health will improve 02/11/2024 1509 by Mila Fairy SAUNDERS, RN Outcome: Progressing 02/11/2024 1509 by Mila Fairy SAUNDERS, RN Outcome: Progressing   Problem: Fluid Volume: Goal: Ability to maintain a balanced intake and output will improve 02/11/2024 1509 by Mila Fairy SAUNDERS, RN Outcome: Progressing 02/11/2024 1509 by Mila Fairy SAUNDERS, RN Outcome: Progressing   Problem: Health Behavior/Discharge Planning: Goal: Ability to identify and utilize available resources and services will improve 02/11/2024 1509 by Mila Fairy SAUNDERS, RN Outcome: Progressing 02/11/2024 1509 by Mila Fairy SAUNDERS, RN Outcome: Progressing Goal: Ability to manage health-related needs will improve 02/11/2024 1509 by Mila Fairy SAUNDERS,  RN Outcome: Progressing 02/11/2024 1509 by Mila Fairy SAUNDERS, RN Outcome: Progressing   Problem: Metabolic: Goal: Ability to maintain appropriate glucose levels will improve 02/11/2024 1509 by Mila Fairy SAUNDERS, RN Outcome: Progressing 02/11/2024 1509 by Mila Fairy SAUNDERS, RN Outcome: Progressing   Problem: Nutritional: Goal: Maintenance of adequate nutrition will improve 02/11/2024 1509 by Mila Fairy SAUNDERS, RN Outcome: Progressing 02/11/2024 1509 by Mila Fairy SAUNDERS, RN Outcome: Progressing Goal: Progress toward achieving an optimal weight will improve 02/11/2024 1509 by Mila Fairy SAUNDERS, RN Outcome: Progressing 02/11/2024 1509 by Mila Fairy SAUNDERS, RN Outcome: Progressing   Problem: Skin Integrity: Goal: Risk for impaired skin integrity will decrease 02/11/2024 1509 by Mila Fairy SAUNDERS, RN Outcome: Progressing 02/11/2024 1509 by Mila Fairy SAUNDERS, RN Outcome: Progressing   Problem: Tissue Perfusion: Goal: Adequacy of tissue perfusion will improve 02/11/2024 1509 by Mila Fairy SAUNDERS, RN Outcome: Progressing 02/11/2024 1509 by Mila Fairy SAUNDERS, RN Outcome: Progressing   Problem: Education: Goal: Knowledge of General Education information will improve Description: Including pain rating scale, medication(s)/side effects and non-pharmacologic comfort measures 02/11/2024 1509 by Mila Fairy SAUNDERS, RN Outcome: Progressing 02/11/2024 1509 by Mila Fairy SAUNDERS, RN Outcome: Progressing   Problem: Health Behavior/Discharge Planning: Goal: Ability to manage health-related needs will improve 02/11/2024 1509 by Mila Fairy SAUNDERS, RN Outcome: Progressing 02/11/2024 1509 by Mila Fairy SAUNDERS, RN Outcome: Progressing   Problem: Clinical Measurements: Goal: Ability to maintain clinical measurements within normal limits will improve 02/11/2024 1509 by Mila Fairy SAUNDERS, RN Outcome: Progressing 02/11/2024 1509 by Mila Fairy SAUNDERS, RN Outcome: Progressing Goal: Will remain free from  infection 02/11/2024 1509 by Mila Fairy SAUNDERS, RN Outcome: Progressing 02/11/2024 1509 by Mila Fairy SAUNDERS, RN Outcome: Progressing Goal: Diagnostic test results will improve 02/11/2024 1509 by Mila Fairy SAUNDERS, RN Outcome: Progressing 02/11/2024 1509 by Mila Fairy SAUNDERS, RN Outcome: Progressing Goal: Respiratory complications will improve 02/11/2024  1509 by Mila Fairy SAUNDERS, RN Outcome: Progressing 02/11/2024 1509 by Mila Fairy SAUNDERS, RN Outcome: Progressing Goal: Cardiovascular complication will be avoided 02/11/2024 1509 by Mila Fairy SAUNDERS, RN Outcome: Progressing 02/11/2024 1509 by Mila Fairy SAUNDERS, RN Outcome: Progressing   Problem: Activity: Goal: Risk for activity intolerance will decrease 02/11/2024 1509 by Mila Fairy SAUNDERS, RN Outcome: Progressing 02/11/2024 1509 by Mila Fairy SAUNDERS, RN Outcome: Progressing   Problem: Nutrition: Goal: Adequate nutrition will be maintained 02/11/2024 1509 by Mila Fairy SAUNDERS, RN Outcome: Progressing 02/11/2024 1509 by Mila Fairy SAUNDERS, RN Outcome: Progressing   Problem: Coping: Goal: Level of anxiety will decrease 02/11/2024 1509 by Mila Fairy SAUNDERS, RN Outcome: Progressing 02/11/2024 1509 by Mila Fairy SAUNDERS, RN Outcome: Progressing   Problem: Elimination: Goal: Will not experience complications related to bowel motility 02/11/2024 1509 by Mila Fairy SAUNDERS, RN Outcome: Progressing 02/11/2024 1509 by Mila Fairy SAUNDERS, RN Outcome: Progressing Goal: Will not experience complications related to urinary retention 02/11/2024 1509 by Mila Fairy SAUNDERS, RN Outcome: Progressing 02/11/2024 1509 by Mila Fairy SAUNDERS, RN Outcome: Progressing   Problem: Pain Managment: Goal: General experience of comfort will improve and/or be controlled 02/11/2024 1509 by Mila Fairy SAUNDERS, RN Outcome: Progressing 02/11/2024 1509 by Mila Fairy SAUNDERS, RN Outcome: Progressing   Problem: Safety: Goal: Ability to remain free from injury will  improve 02/11/2024 1509 by Mila Fairy SAUNDERS, RN Outcome: Progressing 02/11/2024 1509 by Mila Fairy SAUNDERS, RN Outcome: Progressing   Problem: Skin Integrity: Goal: Risk for impaired skin integrity will decrease 02/11/2024 1509 by Mila Fairy SAUNDERS, RN Outcome: Progressing 02/11/2024 1509 by Mila Fairy SAUNDERS, RN Outcome: Progressing

## 2024-02-11 NOTE — Consult Note (Signed)
 NAME:  Raymond [name], MRN:  [id number], DOB:  February 18, 1961, LOS: 1 ADMISSION DATE:  02/09/2024, CONSULTATION DATE:  02/11/24 REFERRING MD:  Charlton CHIEF COMPLAINT:  Hypotension   History of Present Illness:  Raymond [name] is a 63 y.o. male who has a significant PMH as outlined below.  He was admitted to [facility] on 9/16 with diarrhea, hypotension, altered mental status, syncopal event while at dialysis on day of presentation (subsequently did not have his dialysis session).  He was found to have NSTEMI and started on heparin .  An echocardiogram was ordered and demonstrated an EF of 25-30% with G2DD.  Unfortunately, he developed anemia while on heparin  and this was forced to be stopped.  He received 2 units PRBCs on 9/17 with improvement in hemoglobin.  Overnight on 9/18, he had ongoing worsening hypotension.  He was started on albumin  as well as midodrine  and stress dose steroids with some improvement.  When BP was unable to be obtained manually, primary team was notified and upon initial discussion with family they wanted to discuss the pros and cons of fluids vs vasopressors; hence, PCCM was consulted.  On my evaluation of the patient, his systolics were in the 77s and 89s and he is awake though intermittently confused.  He is breathing comfortably on room air and has clear lung fields.  He does have intact radial and pedal pulses.  He has AV fistula in the left upper extremity is nonfunctional presumed due to hypotension.  Discussed his admission as well as his newfound EF of 25-30% to be secondary to NSTEMI.  We discussed starting vasopressors might help with hypotension though could cause some arrhythmias due to his weak heart.  This was the main concern of his wife.  Given the fact that he is on room air and currently breathing comfortably with clear lung fields, wife and family at bedside are asking whether we can trial a small dose of fluids as a start.  They are not  wishing for long-term heroic measures and are against life support interventions to keep him alive.  Patient himself stated that while he would want to be resuscitated, he would never want to be placed on life support machines.  I discussed that if his heart were to stop, he would likely require intubation along with ongoing ACLS as part of his resuscitative efforts.  Patient then stated that he would not want to undergo those measures and that he wishes for ongoing supportive care and that if his heart were to stop or he were to stop breathing, then that would be his time to go.  After long discussion with patient, wife, family member, Dr. Dub, RN, myself, we agreed upon a 500 cc LR bolus, 2 more doses of albumin  and increasing his midodrine .  They understand that if his blood pressure remains low, he would not be a dialysis candidate and this would ultimately lead to eventual cardiorespiratory arrest.  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 (HCC); Pseudophakia of left eye; Renal failure; Critical limb ischemia with history of revascularization  of same extremity (HCC); Personal history of noncompliance with medical treatment, presenting hazards to health; Critical lower limb ischemia (HCC); Infected wound; Diabetic foot infection (HCC); Difficult intravenous access; MRSA bacteremia; Thrombophlebitis of superficial veins of right lower extremity; PAD  (peripheral artery disease) (HCC); Proliferative diabetic retinopathy with macular edema associated with type 2 diabetes mellitus (HCC); Hypomagnesemia; Diabetic foot ulcer (HCC); Hyperkalemia; Obesity (BMI 30-39.9); History of anemia due to chronic kidney disease; Cutaneous abscess of right foot; Acquired absence of right leg below knee (HCC); CKD (chronic kidney disease); Other fatigue; Dermatitis; Class 2 obesity due to excess calories with body mass index (BMI) of 37.0 to 37.9 in adult; Rash; AKI (acute kidney injury) (HCC); Perinephric hematoma of transplanted kidney; Acute blood loss anemia; History of bleeding following renal biopsy; Sinus drainage; COVID-19 vaccination declined; Class 1 obesity due to excess calories with serious comorbidity and body mass index (BMI) of 32.0 to 32.9 in adult; Need for influenza vaccination; Failure of penile implant (HCC); Infectious arthritis (HCC); 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  Significant Hospital Events: Including procedures, antibiotic start and stop dates in addition to other pertinent events   9/16 admit 9/18 PCCM consult  Interim History / Subjective:  Comfortable on room air. Awake though intermittently confused.  Objective:  Blood pressure (!) 51/18, pulse (!) 57, temperature (!) 97.4 F (36.3 C), temperature source Oral, resp. rate 19, height 5' 7 (1.702 m), weight  92.8 kg, SpO2 90%.       No intake or output data in the 24 hours ending 02/11/24 2358 Filed Weights   02/09/24 1407 02/11/24 0548  Weight: 94 kg 92.8 kg     Physical Exam: General: Adult male, chronically ill appearing, resting in bed, in NAD. Neuro: Awake but intermittently confused. HEENT: [place]/AT. EOMI. Cardiovascular: IRIR, brady, no M/R/G.  Lungs: Respirations even and unlabored.  CTA bilaterally, No W/R/R. Abdomen: Obese, BS x 4, soft, NT/ND.  Musculoskeletal: R BKA, no edema.   Assessment & Plan:   Hypotension - multifactorial due to NSTEMI with new mixed CHF (echo 9/18 with EF 25-30%, G2DD), anemia, PVD/PAD, ESRD (though was hypertensive prior to admission).  He has slightly responded to albumin  tonight after further discussions with family, they have opted for ongoing supportive measures without initiation of vasopressors at this time. Do not feel he has any component of sepsis here. CTA chest from 9/18 reviewed, negative for PE. - 500cc LR bolus now over 1 hour. - Additional 2 doses Albumin . - Increase Midodrine  from 10mg  TID to 20mg  TID. - Continue stress dose steroids. - Transfuse for Hgb < 7. - Hold all PTA HTN meds. - Repeat lactate in AM after above measures.  NSTEMI. Syncope. Junctional Bradycardia vs CHB. Prolonged QT. Hx PAD/PVD, HLD, HTN. - Per Cardiology. - Heparin  on hold due to anemia.  ESRD s/p transplant 2016 - started back on dialysis August 2025 but unfortunately now has failed RUE fistula, presumed 2/2 hypotension. - Nephrology to weigh in in AM, might need to engage vascular. - Will need to determine long  term plan as won't be a candidate for any dialysis if has ongoing hypotension. - Continue PTA Tacrolimus  and on stress dose steroids in lieu of PTA prednisone .  Goals of care - DNR/DNI confirmed with pt and family after extensive discussions at bedside. - Could consider palliative consult in AM to help with ongoing planning, consider home  hospice?   Rest per primary team. Nothing further to add.  PCCM will sign off.  Please call us  back if we can be of any further assistance.   Labs   CBC: Recent Labs  Lab 02/09/24 1510 02/10/24 0502 02/10/24 1816 02/10/24 2357 02/11/24 0858 02/11/24 2320  WBC 8.6 6.4  --   --  10.9* 12.4*  NEUTROABS 5.9  --   --   --   --   --   HGB 7.5* 6.7* 9.4* 8.9* 9.1* 9.1*  HCT 29.3* 25.9* 33.1* 31.6* 32.4* 32.0*  MCV 90.7 87.8  --   --  87.8 86.5  PLT 273 293  --   --  278 275    Basic Metabolic Panel: Recent Labs  Lab 02/09/24 1457 02/09/24 1501 02/09/24 1510 02/10/24 0502 02/11/24 0434  NA  --  135 137 137 137  K  --  5.0 3.7 3.6 4.8  CL  --  100 97* 99 98  CO2  --   --  20* 25 17*  GLUCOSE  --  260* 244* 150* 289*  BUN  --  38* 32* 38* 46*  CREATININE  --  5.70* 5.54* 5.99* 7.09*  CALCIUM   --   --  8.4* 8.1* 8.3*  MG  --   --  2.1  --   --   PHOS 5.3*  --   --   --   --    GFR: Estimated Creatinine Clearance: 11.6 mL/min (A) (by C-G formula based on SCr of 7.09 mg/dL (H)). Recent Labs  Lab 02/09/24 1510 02/09/24 1742 02/10/24 0502 02/11/24 0858 02/11/24 2320  WBC 8.6  --  6.4 10.9* 12.4*  LATICACIDVEN 4.7* 3.6*  --   --   --     Liver Function Tests: Recent Labs  Lab 02/09/24 1510  AST 33  ALT 13  ALKPHOS 125  BILITOT 0.8  PROT 8.3*  ALBUMIN  2.3*   No results for input(s): LIPASE, AMYLASE in the last 168 hours. No results for input(s): AMMONIA in the last 168 hours.  ABG    Component Value Date/Time   HCO3 28.5 (H) 06/10/2023 2043   TCO2 25 02/09/2024 1501   ACIDBASEDEF 1.0 03/06/2021 1227   O2SAT 95.1 06/10/2023 2043     Coagulation Profile: No results for input(s): INR, PROTIME in the last 168 hours.  Cardiac Enzymes: No results for input(s): CKTOTAL, CKMB, CKMBINDEX, TROPONINI in the last 168 hours.  HbA1C: Hemoglobin A1C  Date/Time Value Ref Range Status  05/30/2019 12:00 AM 10.6  Final  07/26/2018 12:00 AM 12.4   Final   HbA1c POC (<> result, manual entry)  Date/Time Value Ref Range Status  03/05/2021 10:05 AM >15 4.0 - 5.6 % Final    Comment:    too high   Hgb A1c MFr Bld  Date/Time Value Ref Range Status  01/21/2024 12:08 PM 8.1 (H) 4.8 - 5.6 % Final    Comment:             Prediabetes: 5.7 - 6.4          Diabetes: >6.4  Glycemic control for adults with diabetes: <7.0   09/07/2023 11:30 AM 7.2 (H) 4.8 - 5.6 % Final    Comment:             Prediabetes: 5.7 - 6.4          Diabetes: >6.4          Glycemic control for adults with diabetes: <7.0     CBG: Recent Labs  Lab 02/11/24 0558 02/11/24 0740 02/11/24 1145 02/11/24 1556 02/11/24 2140  GLUCAP 273* 258* 271* 198* 177*    Review of Systems:   Unable to obtain at this time.  Past Medical History:  He,  has a past medical history of Diabetes (HCC), Diabetes mellitus without complication (HCC), ESRD (end stage renal disease) (HCC), Hypertension, Nausea vomiting and diarrhea (12/10/2015), Peripheral vascular disease (HCC), Renal disorder, and Renal insufficiency.   Surgical History:   Past Surgical History:  Procedure Laterality Date   ABDOMINAL AORTOGRAM W/LOWER EXTREMITY N/A 09/11/2021   Procedure: ABDOMINAL AORTOGRAM W/LOWER EXTREMITY;  Surgeon: Darron Deatrice LABOR, MD;  [facility]: [facility];  Service: Cardiovascular;  Laterality: N/A;   AMPUTATION Right 09/25/2021   Procedure: RIGHT TRANSMETATARSAL AMPUTATION AND APPLY TISSUE GRAFT;  Surgeon: Harden Jerona GAILS, MD;  Location: [facility] OR;  Service: Orthopedics;  Laterality: Right;   AMPUTATION Right 11/16/2021   Procedure: RIGHT AMPUTATION BELOW KNEE WITH WOUND VAC APPLICATION;  Surgeon: Harden Jerona GAILS, MD;  Location: [facility] OR;  Service: Orthopedics;  Laterality: Right;   BONE BIOPSY Right 03/06/2021   Procedure: BONE BIOPSY;  Surgeon: Gershon Donnice SAUNDERS, DPM;  Location: [facility] ORS;  Service: Podiatry;  Laterality: Right;   CENTRAL VENOUS CATHETER INSERTION Right 01/20/2020    Procedure: INSERTION CENTRAL LINE ADULT; Tunneled central line;  Surgeon: Kallie Manuelita BROCKS, MD;  Location: [facility];  Service: General;  Laterality: Right;   COLONOSCOPY N/A 12/05/2014   DOQ:fnizmjuz external and internal hemorrhoid/mild diverticulosis/11 polyps removed   ESOPHAGOGASTRODUODENOSCOPY N/A 12/05/2014   DOQ:fpoi duodenitis   GRAFT APPLICATION Right 03/06/2021   Procedure: GRAFT APPLICATION;  Surgeon: Gershon Donnice SAUNDERS, DPM;  Location: [facility];  Service: Podiatry;  Laterality: Right;   INCISION AND DRAINAGE OF WOUND Right 08/15/2021   Procedure: IRRIGATION AND DEBRIDEMENT WOUND;  Surgeon: Burt Fus, DPM;  Location: [facility] OR[facility];  Service: Podiatry;  Laterality: Right;  need to make card not  a irrigation and debridement card   IR FLUORO GUIDE CV LINE RIGHT  03/01/2019   IR PERC TUN PERIT CATH WO PORT S&I /IMAG  06/27/2021   IR REMOVAL TUN CV CATH W/O FL  05/10/2019   IR REMOVAL TUN CV CATH W/O FL  02/29/2020   IR REMOVAL TUN CV CATH W/O FL  07/19/2021   IR US  GUIDE VASC ACCESS RIGHT  03/01/2019   IR US  GUIDE VASC ACCESS RIGHT  06/27/2021   IRRIGATION AND DEBRIDEMENT ELBOW Left 06/24/2021   Procedure: IRRIGATION AND DEBRIDEMENT ELBOW;  Surgeon: Leora Lynwood SAUNDERS, MD;  Location: [facility] ORS;  Service: Orthopedics;  Laterality: Left;   KIDNEY TRANSPLANT  03/27/2015   KNEE ARTHROTOMY Right 06/08/2023   Procedure: KNEE ARTHROTOMY;  Surgeon: Onesimo Oneil LABOR, MD;  Location: [facility];  Service: Orthopedics;  Laterality: Right;   Penile Pump Insertion     PERIPHERAL VASCULAR ATHERECTOMY  09/11/2021   Procedure: PERIPHERAL VASCULAR ATHERECTOMY;  Surgeon: Darron Deatrice LABOR, MD;  Location: [facility];  Service: Cardiovascular;;  Shockwave Lithotripsy   TEE WITHOUT CARDIOVERSION N/A 01/17/2020   Procedure:  TRANSESOPHAGEAL ECHOCARDIOGRAM (TEE) WITH PROPOFOL ;  Surgeon: Alvan Dorn FALCON, MD;  Location: [facility];  Service: Endoscopy;  Laterality: N/A;   WOUND DEBRIDEMENT Right 03/06/2021    Procedure: DEBRIDEMENT WOUND;  Surgeon: Gershon Donnice SAUNDERS, DPM;  Location: [facility];  Service: Podiatry;  Laterality: Right;     Social History:   reports that he has never smoked. He has never used smokeless tobacco. He reports that he does not drink alcohol and does not use drugs.   Family History:  His family history includes Diabetes in his mother; Heart disease in his father. There is no history of Colon cancer.   Allergies Allergies  Allergen Reactions   Bactrim  [Sulfamethoxazole -Trimethoprim ] Other (See Comments)    Runs pt's sugar and phosphorus   Doxycycline  Other (See Comments)    Runs up pt's blood sugar and phosphorus   Flagyl  [Metronidazole ] Hives and Rash    Patient having hives to either the vancomycin  or flagyl  (both infusing together).   Vancomycin  Hives and Rash    Patient having hives to either vancomycin  or flagyl      Home Medications  Prior to Admission medications   Medication Sig Start Date End Date Taking? Authorizing Provider  acetaminophen  (TYLENOL ) 500 MG tablet Take 2 tablets (1,000 mg total) by mouth every 8 (eight) hours as needed. Patient taking differently: Take 1,000 mg by mouth every 6 (six) hours as needed for mild pain (pain score 1-3). 02/04/24 02/03/25 Yes Georgina Speaks, FNP  atorvastatin  (LIPITOR) 10 MG tablet Take 1 tablet by mouth once daily 06/19/23  Yes Court Dorn PARAS, MD  Azathioprine  75 MG TABS Take 150 mg by mouth daily.   Yes [provider]  cloNIDine  (CATAPRES  - DOSED IN MG/24 HR) 0.3 mg/24hr patch Place 1 patch (0.3 mg total) onto the skin once a week. Patient taking differently: Place 0.3 mg onto the skin every Sunday. 08/21/21  Yes Court Dorn PARAS, MD  clopidogrel  (PLAVIX ) 75 MG tablet Take 75 mg by mouth daily. 04/09/23  Yes [provider]  doxazosin  (CARDURA ) 4 MG tablet Take 4 mg by mouth 2 (two) times daily. 02/02/23  Yes [provider]  Dulaglutide  (TRULICITY ) 3 MG/0.5ML SOAJ Inject 3 mg as directed  once a week. Patient taking differently: Inject 3 mg as directed every Sunday. 09/07/23  Yes Thapa, [place], MD  furosemide  (LASIX ) 80 MG tablet Take 80 mg by mouth 2 (two) times daily as needed for fluid or edema. 11/02/23  Yes [provider]  gabapentin  (NEURONTIN ) 300 MG capsule Take 300 mg by mouth at bedtime.   Yes [provider]  hydrALAZINE  (APRESOLINE ) 25 MG tablet Take 2 tablets (50 mg total) by mouth 3 (three) times daily. 06/12/23  Yes Johnson, Clanford L, MD  insulin  lispro (HUMALOG  KWIKPEN) 100 UNIT/ML KwikPen Inject 5-10 Units into the skin 3 (three) times daily. Maximum 30 units/day. 02/04/24  Yes Thapa, [place], MD  latanoprost  (XALATAN ) 0.005 % ophthalmic solution Place 1 drop into both eyes at bedtime. 07/25/21  Yes [provider]  loperamide  (IMODIUM ) 2 MG capsule Take 2 mg by mouth as needed for diarrhea or loose stools.   Yes [provider]  Multiple Vitamin (MULTIVITAMIN) capsule Take 1 capsule by mouth daily.   Yes [provider]  omeprazole  (PRILOSEC) 40 MG capsule Take 40 mg by mouth every morning. 03/31/23  Yes [provider]  oxyCODONE -acetaminophen  (PERCOCET/ROXICET) 5-325 MG tablet Take 1 tablet by mouth every 8 (eight) hours as needed for severe pain (  pain score 7-10). 02/04/24  Yes Georgina Speaks, FNP  predniSONE  (DELTASONE ) 5 MG tablet Take 1 tablet (5 mg total) by mouth daily with breakfast. 02/04/24  Yes Georgina Speaks, FNP  sevelamer  carbonate (RENVELA ) 800 MG tablet Take 800 mg by mouth 3 (three) times daily with meals. 12/03/23 12/02/24 Yes [provider]  tacrolimus  (PROGRAF ) 1 MG capsule Take 3 mg by mouth 2 (two) times daily.   Yes [provider]  Vitamin D , Ergocalciferol , (DRISDOL ) 50000 units CAPS capsule Take 50,000 Units by mouth every 30 (thirty) days. 15th of the month   Yes [provider]  Vonoprazan Fumarate  (VOQUEZNA ) 10 MG TABS Take 1 tablet by mouth daily. 12/23/23  Yes [provider]  blood glucose meter kit and supplies KIT Dispense based on patient and insurance preference. Use up to four times daily as directed. (FOR ICD-9 250.00, 250.01). 04/11/20   [name], Gebreselassie W, MD  Blood Glucose Monitoring Suppl (ACCU-CHEK GUIDE ME) w/Device KIT 1 Piece by Does not apply route as directed. 02/18/18   [name], Gebreselassie W, MD  calcitRIOL  (ROCALTROL ) 0.25 MCG capsule Take 0.25 mcg by mouth. 10/21/23   [provider]  Continuous Blood Gluc Receiver (DEXCOM G7 RECEIVER) DEVI Use to check blood sugar 3 times a day. Dx code e11.65 12/16/21   Georgina Speaks, FNP  Continuous Glucose Sensor (DEXCOM G7 SENSOR) MISC USE TO CHECK GLUCOSE THREE TIMES DAILY AS DIRECTED, CHANGE SENSOR EVERY 10 DAYS. 09/09/23   Thapa, [place], MD  glucose blood (ACCU-CHEK GUIDE) test strip Use as instructed 4 x daily. E11.65 02/19/18   Lenis Ethelle ORN, MD     Sammi Gore, PA - [facility] For pager details, please see AMION or use Epic chat  After 1900, please call [facility] for cross coverage needs 02/11/2024, 11:58 PM

## 2024-02-11 NOTE — Progress Notes (Addendum)
 Progress Note  Patient Name: Raymond [name] Date of Encounter: 02/11/2024 [facility] Cardiologist: Dorn Lesches, MD   Interval Summary    No complaints this morning. Wants breakfast. No chest pain, breathing ok.   Vital Signs Vitals:   02/10/24 2149 02/10/24 2327 02/11/24 0548 02/11/24 0734  BP: (!) 151/119 (!) 99/56 (!) 72/51 (!) 90/58  Pulse: 61 (!) 49 (!) 44 (!) 51  Resp: 19 17 20 20   Temp: 97.7 F (36.5 C) 97.6 F (36.4 C) 97.6 F (36.4 C) (!) 97.4 F (36.3 C)  TempSrc: Oral Oral Oral Oral  SpO2: 98% 100% 100% 100%  Weight:   92.8 kg   Height:        Intake/Output Summary (Last 24 hours) at 02/11/2024 0813 Last data filed at 02/10/2024 1139 Gross per 24 hour  Intake 630.33 ml  Output --  Net 630.33 ml      02/11/2024    5:48 AM 02/09/2024    2:07 PM 02/04/2024    4:08 PM  Last 3 Weights  Weight (lbs) 204 lb 9.4 oz 207 lb 3.7 oz --  Weight (kg) 92.8 kg 94 kg --      Telemetry/ECG   Junctional bradycardia vs  - Personally Reviewed  Physical Exam  GEN: No acute distress.   Neck: No JVD Cardiac: RRR, 2/6 systolic murmur, no rubs, or gallops.  Respiratory: Clear to auscultation bilaterally. GI: Soft, nontender, non-distended  MS: Right BKA  Assessment & Plan   63 y.o. male with a hx of PAD (s/p prior angioplasty and eventually required right BKA), HTN, HLD and ESRD who is being seen 02/10/2024 for the evaluation of NSTEMI at the request of Dr. Willette.   Syncope Junctional Bradycardia vs CHB Prolonged QT -- Admitted to ED for an episode of syncope in the setting of worsening diarrhea for 3 weeks.  Initial EKG showed sinus bradycardia with A-V dissociation and junctional escape rhythm. -- Reported episodes where he blacked out -- Initial BP in the ER was 54/31 which improved with IV fluids, lactic acid elevated at 4.7 -- EKG 9/17 A-V dissociation, 51 bpm, QTc 546. Repeat EKG this morning -- denies any symptoms since admission,  will need EP evaluation as he is on no AVN blocking agents   Elevated troponin -- Denied any chest pain, high-sensitivity troponin peaked at 9053 -- Suspect in the setting of profound anemia hypertension, could be demand ischemia but given patient's history of PAD suspect likely has CAD as well.  Did have CT chest with mention of multivessel coronary artery calcifications -- Echo pending, prelim read per Dr. Ladona with concern for RV stain and septal flattening. Rule out PE? -- Initially was on IV heparin  but this was stopped in the setting of worsening anemia.  -- with concern for possible PE, and anemia would defer invasive evaluation at this time   Anemia -- Hemoglobin of 12 11/02/2023 dropped to 7.5 on 9/11 and further to 6.7 9/17 -- S/p 2 unit PRBCs, Hgb 8.9 this morning -- denies any hematuria/hematochezia PTA  Hypotension -- hx of hypertension on PTA clonidine  patch, doxazosin  4 mg twice daily and hydralazine  50 mg twice daily  -- RN reports trouble with obtaining BPs overnight, last BP 109 this morning. Reports his pressures run soft at dialysis. -- no BP meds at this time   ESRD s/p renal transplant '16 -- Unfortunately he has had allograft deterioration and become uremic again and was recently restarted on dialysis 01/01/2024 -- Currently  on tacrolimus  3mg  twice daily and prednisone  5 mg daily -- Mycophenolate  was discontinued -- per nephrology   PAD -- Vascular angiography 2023 : Occluded proximal anterior tibial artery with reconstitution of the dorsalis pedis as well as 80-90% posterior tibial artery stenosis status post intravascular lithotripsy with balloon angioplasty to the right posterior tibial artery -- s/p right BKA by Dr. Harden 11/16/2021 for poor right heel wound healing  HLD -- LDL 93, HDL 59, triglycerides 84 on 09/07/2023 -- Atorvastatin  has been increased to 80 mg daily this admission  For questions or updates, please contact [facility] Please consult  [email or web address] for contact info under     Signed, Manuelita Rummer, NP

## 2024-02-11 NOTE — Progress Notes (Signed)
 Tech made RN aware of the inability to get blood pressure due to the  patient being restless. Rn attempted to get the blood pressure via the monitor but was getting mixed pressures ranging from 40', to 90's. RN attempted to get manual but was unable, charge nurse attempted and was unsuccessful. Charge RN called rapid nurse to take a look at the patient. This RN alerted the DR with concerns about blood pressure and patient having 2 episodes where he blacked out for about 10 seconds each time. Pt is alert and oriented and states I feel fine

## 2024-02-12 ENCOUNTER — Inpatient Hospital Stay (HOSPITAL_COMMUNITY)

## 2024-02-12 DIAGNOSIS — R55 Syncope and collapse: Secondary | ICD-10-CM | POA: Diagnosis not present

## 2024-02-12 DIAGNOSIS — G9341 Metabolic encephalopathy: Secondary | ICD-10-CM

## 2024-02-12 LAB — CBC
HCT: 36.2 % — ABNORMAL LOW (ref 39.0–52.0)
Hemoglobin: 9.7 g/dL — ABNORMAL LOW (ref 13.0–17.0)
MCH: 24.3 pg — ABNORMAL LOW (ref 26.0–34.0)
MCHC: 26.8 g/dL — ABNORMAL LOW (ref 30.0–36.0)
MCV: 90.5 fL (ref 80.0–100.0)
Platelets: 306 K/uL (ref 150–400)
RBC: 4 MIL/uL — ABNORMAL LOW (ref 4.22–5.81)
RDW: 17.2 % — ABNORMAL HIGH (ref 11.5–15.5)
WBC: 18.2 K/uL — ABNORMAL HIGH (ref 4.0–10.5)
nRBC: 6 % — ABNORMAL HIGH (ref 0.0–0.2)

## 2024-02-12 LAB — LIPID PANEL
Cholesterol: 111 mg/dL (ref 0–200)
HDL: 34 mg/dL — ABNORMAL LOW (ref >40)
LDL Cholesterol: 57 mg/dL (ref 0–99)
Total CHOL/HDL Ratio: 3.3 ratio
Triglycerides: 102 mg/dL (ref <150)
VLDL: 20 mg/dL (ref 0–40)

## 2024-02-12 LAB — BASIC METABOLIC PANEL WITH GFR
Anion gap: 20 — ABNORMAL HIGH (ref 5–15)
Anion gap: 25 — ABNORMAL HIGH (ref 5–15)
BUN: 59 mg/dL — ABNORMAL HIGH (ref 8–23)
BUN: 59 mg/dL — ABNORMAL HIGH (ref 8–23)
CO2: 22 mmol/L (ref 22–32)
CO2: 13 mmol/L — ABNORMAL LOW (ref 22–32)
Calcium: 8.4 mg/dL — ABNORMAL LOW (ref 8.9–10.3)
Calcium: 8.4 mg/dL — ABNORMAL LOW (ref 8.9–10.3)
Chloride: 95 mmol/L — ABNORMAL LOW (ref 98–111)
Chloride: 99 mmol/L (ref 98–111)
Creatinine, Ser: 7.87 mg/dL — ABNORMAL HIGH (ref 0.61–1.24)
Creatinine, Ser: 8.54 mg/dL — ABNORMAL HIGH (ref 0.61–1.24)
GFR, Estimated: 7 mL/min — ABNORMAL LOW (ref >60)
GFR, Estimated: 6 mL/min — ABNORMAL LOW (ref >60)
Glucose, Bld: 196 mg/dL — ABNORMAL HIGH (ref 70–99)
Glucose, Bld: 229 mg/dL — ABNORMAL HIGH (ref 70–99)
Potassium: 4.9 mmol/L (ref 3.5–5.1)
Potassium: 4.9 mmol/L (ref 3.5–5.1)
Sodium: 137 mmol/L (ref 135–145)
Sodium: 137 mmol/L (ref 135–145)

## 2024-02-12 LAB — LACTIC ACID, PLASMA
Lactic Acid, Venous: 4.3 mmol/L (ref 0.5–1.9)
Lactic Acid, Venous: 8.9 mmol/L (ref 0.5–1.9)

## 2024-02-12 LAB — HEPATITIS B SURFACE ANTIBODY, QUANTITATIVE: Hep B S AB Quant (Post): 48.6 m[IU]/mL

## 2024-02-12 LAB — GLUCOSE, CAPILLARY: Glucose-Capillary: 231 mg/dL — ABNORMAL HIGH (ref 70–99)

## 2024-02-12 MED ORDER — HYDROMORPHONE HCL 1 MG/ML IJ SOLN: 0.5000 mg | INTRAMUSCULAR | Status: DC | PRN

## 2024-02-12 MED ORDER — LORAZEPAM 1 MG PO TABS: 1.0000 mg | ORAL_TABLET | ORAL | Status: DC | PRN

## 2024-02-12 MED ORDER — MORPHINE SULFATE (PF) 2 MG/ML IV SOLN
2.0000 mg | Freq: Once | INTRAVENOUS | Status: AC
  Administered 2024-02-12: 2 mg via INTRAVENOUS
  Filled 2024-02-12: qty 1

## 2024-02-12 MED ORDER — ONDANSETRON HCL 4 MG/2ML IJ SOLN: 4.0000 mg | Freq: Four times a day (QID) | INTRAMUSCULAR | Status: DC | PRN

## 2024-02-12 MED ORDER — ONDANSETRON 4 MG PO TBDP: 4.0000 mg | ORAL_TABLET | Freq: Four times a day (QID) | ORAL | Status: DC | PRN

## 2024-02-12 MED ORDER — ATROPINE SULFATE 1 % OP SOLN: 4.0000 [drp] | OPHTHALMIC | Status: DC | PRN

## 2024-02-12 MED ORDER — LORAZEPAM 2 MG/ML PO CONC: 1.0000 mg | ORAL | Status: DC | PRN

## 2024-02-12 MED ORDER — LORAZEPAM 2 MG/ML IJ SOLN: 1.0000 mg | INTRAMUSCULAR | Status: DC | PRN

## 2024-02-14 DIAGNOSIS — K591 Functional diarrhea: Secondary | ICD-10-CM | POA: Insufficient documentation

## 2024-02-14 DIAGNOSIS — M791 Myalgia, unspecified site: Secondary | ICD-10-CM | POA: Insufficient documentation

## 2024-02-14 LAB — TYPE AND SCREEN
ABO/RH(D): O POS
Antibody Screen: NEGATIVE
Unit division: 0
Unit division: 0
Unit division: 0

## 2024-02-14 LAB — BPAM RBC
Blood Product Expiration Date: 202510082359
Blood Product Expiration Date: 202510082359
Blood Product Expiration Date: 202510092359
ISSUE DATE / TIME: 202509170854
ISSUE DATE / TIME: 202509171128
Unit Type and Rh: 5100
Unit Type and Rh: 5100
Unit Type and Rh: 5100

## 2024-02-14 LAB — CULTURE, BLOOD (ROUTINE X 2)
Culture: NO GROWTH
Culture: NO GROWTH

## 2024-02-14 NOTE — Assessment & Plan Note (Signed)
 Will check CBC

## 2024-02-14 NOTE — Assessment & Plan Note (Signed)
 Positive ANA and elevated SED rate suggestive of inflammatory disorder. Pain management complicated by renal failure. - Refer to rheumatologist for further evaluation. - Administer Kenalog  injection for pain management. - Prescribe limited supply of Percocet for severe pain, caution with Tylenol  intake. - Advise on Tylenol  intake: if taking Percocet twice a day, limit Tylenol  to once a day, and vice versa.

## 2024-02-14 NOTE — Assessment & Plan Note (Signed)
 Diarrhea for three weeks, possibly related to dialysis. Need to rule out viral or bacterial cause. - Advise against Imodium  to allow potential infection clearance. - Encourage hydration with Gatorade Zero, water, or Pedialyte. - Provide stool cups for culture to check for infection. - Instruct to go to ER if symptoms worsen or dehydration occurs.

## 2024-02-14 NOTE — Assessment & Plan Note (Signed)
 A1c increased to 8.1. Management complicated by dietary intake issues due to diarrhea and GERD. - Encourage dietary intake to manage blood glucose levels.

## 2024-02-16 ENCOUNTER — Telehealth: Payer: Self-pay | Admitting: Nurse Practitioner

## 2024-02-16 NOTE — Telephone Encounter (Signed)
 Called to express condolences to his wife. She expressed appreciation of the care provided by us  to him.

## 2024-02-24 NOTE — Progress Notes (Signed)
 Patient was seen for agonal breathing and unresponsiveness. He had been seen earlier in the shift for severe hypotension, please see prior documentation for details.   His wife is at the bedside and understands that he is actively dying. She agrees with transitioning to comfort care now.

## 2024-02-24 NOTE — Plan of Care (Signed)
  Problem: Education: Goal: Knowledge of condition and prescribed therapy will improve Outcome: Progressing   Problem: Cardiac: Goal: Will achieve and/or maintain adequate cardiac output Outcome: Progressing   Problem: Physical Regulation: Goal: Complications related to the disease process, condition or treatment will be avoided or minimized Outcome: Progressing   Problem: Education: Goal: Ability to describe self-care measures that may prevent or decrease complications (Diabetes Survival Skills Education) will improve Outcome: Progressing Goal: Individualized Educational Video(s) Outcome: Progressing   Problem: Coping: Goal: Ability to adjust to condition or change in health will improve Outcome: Progressing   Problem: Fluid Volume: Goal: Ability to maintain a balanced intake and output will improve Outcome: Progressing   Problem: Health Behavior/Discharge Planning: Goal: Ability to identify and utilize available resources and services will improve Outcome: Progressing Goal: Ability to manage health-related needs will improve Outcome: Progressing   Problem: Metabolic: Goal: Ability to maintain appropriate glucose levels will improve Outcome: Progressing   Problem: Nutritional: Goal: Maintenance of adequate nutrition will improve Outcome: Progressing Goal: Progress toward achieving an optimal weight will improve Outcome: Progressing   Problem: Skin Integrity: Goal: Risk for impaired skin integrity will decrease Outcome: Progressing   Problem: Tissue Perfusion: Goal: Adequacy of tissue perfusion will improve Outcome: Progressing   Problem: Education: Goal: Knowledge of General Education information will improve Description: Including pain rating scale, medication(s)/side effects and non-pharmacologic comfort measures Outcome: Progressing   Problem: Health Behavior/Discharge Planning: Goal: Ability to manage health-related needs will improve Outcome:  Progressing   Problem: Clinical Measurements: Goal: Ability to maintain clinical measurements within normal limits will improve Outcome: Progressing Goal: Will remain free from infection Outcome: Progressing Goal: Diagnostic test results will improve Outcome: Progressing Goal: Respiratory complications will improve Outcome: Progressing Goal: Cardiovascular complication will be avoided Outcome: Progressing   Problem: Activity: Goal: Risk for activity intolerance will decrease Outcome: Progressing   Problem: Nutrition: Goal: Adequate nutrition will be maintained Outcome: Progressing   Problem: Coping: Goal: Level of anxiety will decrease Outcome: Progressing   Problem: Elimination: Goal: Will not experience complications related to bowel motility Outcome: Progressing Goal: Will not experience complications related to urinary retention Outcome: Progressing   Problem: Pain Managment: Goal: General experience of comfort will improve and/or be controlled Outcome: Progressing   Problem: Safety: Goal: Ability to remain free from injury will improve Outcome: Progressing   Problem: Skin Integrity: Goal: Risk for impaired skin integrity will decrease Outcome: Progressing   Problem: Education: Goal: Knowledge of the prescribed therapeutic regimen will improve Outcome: Progressing   Problem: Coping: Goal: Ability to identify and develop effective coping behavior will improve Outcome: Progressing   Problem: Clinical Measurements: Goal: Quality of life will improve Outcome: Progressing   Problem: Respiratory: Goal: Verbalizations of increased ease of respirations will increase Outcome: Progressing   Problem: Role Relationship: Goal: Family's ability to cope with current situation will improve Outcome: Progressing Goal: Ability to verbalize concerns, feelings, and thoughts to partner or family member will improve Outcome: Progressing   Problem: Pain  Management: Goal: Satisfaction with pain management regimen will improve Outcome: Progressing

## 2024-02-24 NOTE — Death Summary Note (Signed)
 DEATH SUMMARY   Patient Details  Name: Raymond [name] MRN: [id number] DOB: 1960/12/24 ERE:Fnnmz, Gaines, FNP Admission/Discharge Information   Admit Date:  2024-02-27  Date of Death: Date of Death: (P) March 01, 2024  Time of Death: Time of Death: (P) 1005  Length of Stay: 2   Principle Cause of death: NSTEMI with cardiogenic shock  Hospital Course: 63 y.o. African-American male with medical history significant for T2DM, ESRD on HD, HTN, who has been having diarrhea over the last few weeks and experienced syncope in his dialysis center,was noted to have hypotension with blood pressure in the 50 w/o chest pain or palpitations,cough or wheezing or dyspnea.e has been having dizziness and generalized weakness.   In ED: BP was 151/39.Labs revealed CO2 of 20 and blood Kos of 244 chloride 97 with a BUN of 32 and creatinine 5.54 calcium  8.4 with anion gap of 20.  Albumin  was 2.3 total protein was 8.3.  High-sensitivity troponin 3329 and later 3624. EKG sinus bradycardia with a rate of 52 with right axis deviation lateral ST segment depression with T wave inversion in 1 mm ST segment elevation in lead III. Repeat EKG showed normal sinus rhythm with a rate of 74 and borderline right axis deviation  Cxr>cardiomegaly and no acute cardiopulmonary disease. s/p 2 L bolus of IV lactated ringer .  Concern for brief third-degree block that resolved with improved blood pressure, cardiology consulted.  Patient also given 2 unit PRBC as hemoglobin was low.  Patient was transferred from ED to ED to be admitted to [facility]. Patient was subsequently seen by nephrology cardiology, PCCM was discussed as well Vitals fairly stable but given his high risk placed on conservative management for syncope junctional escape rhythm/complete heart block cardiogenic shock new onset systolic CHF EF now 25-30%. Further goals of care discussed by cardiology and changed to DNR 9/18 based on patient's wishes. 9/18-9/19: Overnight  patient had acute decompensation with hypotension lactic acidosis, with family at bedside along with critical care consult, family opted for no escalation of care and subsequently transitionED to COMFORT CARE. 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 Hx of angiography in 2023 Acute on chronic anemia T2DM W/ neuropathy with uncontrolled hyperglycemia Recent diarrheal illness Severe metabolic acidosis with lactic acidosis Leukocytosis Class II Obesity w/ Body mass index is 32.04 kg/m.: End-of-life care Goals of Care: Patient with multiorgan failure, in the setting of cardiogenic shock, NSTEMI, and significant other comorbidities, patient has been transitioned to comfort measures. He passed away peacefully with family at bedside 03/01/24 AM  Objective: Vitals last 24 hrs: Vitals:   03/01/24 0010 Mar 01, 2024 0020 Mar 01, 2024 0030 2024/03/01 0038  BP: (!) 127/102   108/76  Pulse:    (!) 47  Resp: (!) 22 (!) 23 20   Temp:    (!) 96.6 F (35.9 C)  TempSrc:    Rectal  SpO2:      Weight:      Height:        Physical Examination: none  Medications reviewed:  Scheduled Meds:   Continuous Infusions: Diet: Diet Order     None       Consultations:  Cardiology Nephrology Pulmonary critical care  The results of significant diagnostics from this hospitalization (including imaging, microbiology, ancillary and laboratory) are listed below for reference.   Significant Diagnostic Studies: CT Angio Chest Pulmonary Embolism (PE) W or WO Contrast Result Date: 02/11/2024 CLINICAL DATA:  Pulmonary  embolism (PE) suspected, high prob. Weakness. EXAM: CT ANGIOGRAPHY CHEST WITH CONTRAST TECHNIQUE: Multidetector CT imaging of the chest was performed using the standard protocol during bolus administration of intravenous contrast. Multiplanar CT image reconstructions and MIPs were obtained to evaluate the vascular anatomy. RADIATION DOSE REDUCTION: This exam was performed according to the departmental dose-optimization program which includes automated exposure control, adjustment of the mA and/or kV according to patient size and/or use of iterative reconstruction technique. CONTRAST:  65mL OMNIPAQUE  IOHEXOL  350 MG/ML SOLN COMPARISON:  12/29/2023 FINDINGS: Cardiovascular: Mild cardiomegaly. Diffuse coronary artery calcifications and scattered aortic calcifications. No evidence of aortic aneurysm. No filling defects in the pulmonary arteries to suggest pulmonary emboli. Mediastinum/Nodes: No mediastinal, hilar, or axillary adenopathy. Trachea and esophagus are unremarkable. Thyroid unremarkable. Lungs/Pleura: Small bilateral pleural effusions. No confluent airspace opacities. Upper Abdomen: Layering gallstones within the gallbladder. Haziness/indistinctness of the visualized gallbladder wall. Small amount of perihepatic and perisplenic ascites. Musculoskeletal: Mild gynecomastia.  No acute bony abnormality. Review of the MIP images confirms the above findings. IMPRESSION: No evidence of pulmonary embolus. Diffuse coronary artery disease. Small bilateral pleural effusions. Cholelithiasis. There appears to be haziness/indistinctness of the gallbladder wall with possible pericholecystic fluid. This could be further evaluated with right upper quadrant ultrasound if felt clinically indicated. Small amount of perihepatic and perisplenic ascites. Aortic Atherosclerosis (ICD10-I70.0). Electronically Signed   By: Franky Crease M.D.   On: 02/11/2024 15:05   ECHOCARDIOGRAM LIMITED Result Date: 02/11/2024    ECHOCARDIOGRAM  LIMITED REPORT   Patient Name:   Raymond [name] [facility] Date of Exam: 02/10/2024 Medical Rec #:  [id number]               Height:       67.0 in Accession #:    [id number]              Weight:       207.2 lb Date of Birth:  Mar 23, 1961               BSA:          2.053 m Patient Age:    63 years                BP:           121/98 mmHg Patient Gender: M                       HR:           57 bpm. Exam Location:  Inpatient Procedure: Limited Echo and Limited Color Doppler (Both Spectral and Color Flow            Doppler were utilized during procedure). Indications:    NSTEMI I21.4  History:        Patient has prior history of Echocardiogram examinations, most                 recent 07/10/2022. Previous Myocardial Infarction, PAD and CKD,                 Signs/Symptoms:Syncope; Risk Factors:Hypertension and Diabetes.  Sonographer:    Thea Norlander RCS Referring Phys: [id number] GORDY BERGAMO  Sonographer Comments: Image acquisition challenging due to uncooperative patient. IMPRESSIONS  1. Left ventricular  ejection fraction, by estimation, is 25 to 30%. The left ventricle has severely decreased function. The left ventricle demonstrates regional wall motion abnormalities (see scoring diagram/findings for description). The left ventricular internal cavity size was severely dilated. There is moderate asymmetric left ventricular hypertrophy of the septal segment. Left ventricular diastolic parameters are consistent with Grade II diastolic dysfunction (pseudonormalization). Elevated left ventricular end-diastolic pressure. There is the interventricular septum is flattened in systole and diastole, consistent with right ventricular pressure and volume overload.  2. Right ventricular systolic function is moderately reduced. The right ventricular size is normal. There is normal pulmonary artery systolic pressure.  3. The mitral valve is normal in structure. Trivial mitral valve regurgitation. No evidence of mitral stenosis.  4. The aortic  valve is tricuspid. Aortic valve regurgitation is not visualized. No aortic stenosis is present.  5. The inferior vena cava is dilated in size with <50% respiratory variability, suggesting right atrial pressure of 15 mmHg. Comparison(s): Changes from prior study are noted. The left ventricular function is significantly worse. FINDINGS  Left Ventricle: Left ventricular ejection fraction, by estimation, is 25 to 30%. The left ventricle has severely decreased function. The left ventricle demonstrates regional wall motion abnormalities. The left ventricular internal cavity size was severely dilated. There is moderate asymmetric left ventricular hypertrophy of the septal segment. The interventricular septum is flattened in systole and diastole, consistent with right ventricular pressure and volume overload. Left ventricular diastolic parameters are consistent with Grade II diastolic dysfunction (pseudonormalization). Elevated left ventricular end-diastolic pressure.  LV Wall Scoring: There is diffuse hypokinesis. Right Ventricle: The right ventricular size is normal. No increase in right ventricular wall thickness. Right ventricular systolic function is moderately reduced. There is normal pulmonary artery systolic pressure. The tricuspid regurgitant velocity is 1.90 m/s, and with an assumed right atrial pressure of 15 mmHg, the estimated right ventricular systolic pressure is 29.4 mmHg. Left Atrium: Left atrial size was normal in size. Right Atrium: Right atrial size was normal in size. Pericardium: There is no evidence of pericardial effusion. Mitral Valve: The mitral valve is normal in structure. Trivial mitral valve regurgitation. No evidence of mitral valve stenosis. Tricuspid Valve: The tricuspid valve is normal in structure. Tricuspid valve regurgitation is mild . No evidence of tricuspid stenosis. Aortic Valve: The aortic valve is tricuspid. Aortic valve regurgitation is not visualized. No aortic stenosis is  present. Aortic valve peak gradient measures 10.5 mmHg. Pulmonic Valve: The pulmonic valve was normal in structure. Pulmonic valve regurgitation is trivial. No evidence of pulmonic stenosis. Aorta: The aortic root is normal in size and structure. Venous: The inferior vena cava is dilated in size with less than 50% respiratory variability, suggesting right atrial pressure of 15 mmHg. IAS/Shunts: No atrial level shunt detected by color flow Doppler. LEFT VENTRICLE PLAX 2D LVIDd:         4.40 cm   Diastology LVIDs:         3.20 cm   LV e' medial:    6.09 cm/s LV PW:         0.80 cm   LV E/e' medial:  16.1 LV IVS:        1.40 cm   LV e' lateral:   8.92 cm/s LVOT diam:     2.10 cm   LV E/e' lateral: 11.0 LV SV:         63 LV SV Index:   31 LVOT Area:     3.46 cm  RIGHT VENTRICLE  IVC RV S prime:     8.38 cm/s  IVC diam: 3.00 cm TAPSE (M-mode): 1.1 cm LEFT ATRIUM         Index LA diam:    4.50 cm 2.19 cm/m  AORTIC VALVE AV Area (Vmax): 2.65 cm AV Vmax:        162.00 cm/s AV Peak Grad:   10.5 mmHg LVOT Vmax:      124.00 cm/s LVOT Vmean:     75.800 cm/s LVOT VTI:       0.183 m  AORTA Ao Root diam: 3.80 cm Ao Asc diam:  3.50 cm MITRAL VALVE               TRICUSPID VALVE MV Area (PHT): 3.77 cm    TR Peak grad:   14.4 mmHg MV Decel Time: 201 msec    TR Vmax:        190.00 cm/s MV E velocity: 98.00 cm/s MV A velocity: 36.20 cm/s  SHUNTS MV E/A ratio:  2.71        Systemic VTI:  0.18 m                            Systemic Diam: 2.10 cm Annabella Scarce MD Electronically signed by Annabella Scarce MD Signature Date/Time: 02/11/2024/11:03:34 AM    Final    DG Chest Portable 1 View Result Date: 02/09/2024 CLINICAL DATA:  Weakness. EXAM: PORTABLE CHEST 1 VIEW COMPARISON:  09/01/2023. FINDINGS: Stable cardiomegaly. No overt pulmonary edema, focal consolidation, pleural effusion, or pneumothorax. Left axillary vascular stent. No acute osseous abnormality. IMPRESSION: 1. No acute cardiopulmonary findings. 2. Stable  cardiomegaly. Electronically Signed   By: Harrietta Sherry M.D.   On: 02/09/2024 16:04    Microbiology: Recent Results (from the past 240 hours)  Culture, blood (routine x 2)     Status: None (Preliminary result)   Collection Time: 02/09/24  3:10 PM   Specimen: BLOOD  Result Value Ref Range Status   Specimen Description BLOOD BLOOD RIGHT ARM  Final   Special Requests   Final    BOTTLES DRAWN AEROBIC ONLY Blood Culture results may not be optimal due to an inadequate volume of blood received in culture bottles   Culture   Final    NO GROWTH 3 DAYS Performed at [facility], [address] [place], [place] [address]    Report Status PENDING  Incomplete  Culture, blood (routine x 2)     Status: None (Preliminary result)   Collection Time: 02/09/24  3:10 PM   Specimen: BLOOD  Result Value Ref Range Status   Specimen Description BLOOD BLOOD RIGHT ARM  Final   Special Requests   Final    BOTTLES DRAWN AEROBIC ONLY Blood Culture results may not be optimal due to an inadequate volume of blood received in culture bottles   Culture   Final    NO GROWTH 3 DAYS Performed at [facility], [address], [place], [place] [address]    Report Status PENDING  Incomplete  MRSA Next Gen by PCR, Nasal     Status: None   Collection Time: 02/10/24  5:53 PM   Specimen: Nasal Mucosa; Nasal Swab  Result Value Ref Range Status   MRSA by PCR Next Gen NOT DETECTED NOT DETECTED Final    Comment: (NOTE) The GeneXpert MRSA Assay (FDA approved for NASAL specimens only), is one component of a comprehensive MRSA colonization surveillance program. It is not intended  to diagnose MRSA infection nor to guide or monitor treatment for MRSA infections. Test performance is not FDA approved in patients less than 67 years old. Performed at [facility], [address], [place], [place] [address]     Time spent: 15 minutes  Signed: Mennie LAMY, MD 02/15/24

## 2024-02-24 NOTE — Plan of Care (Signed)

## 2024-02-24 NOTE — Progress Notes (Addendum)
 Pt receives out-pt HD at [name] on TTS 5:15 am chair time. Will assist as needed.   Randine Mungo Dialysis Navigator [phone]  Addendum at 9:45 am: Reviewed chart. Contacted [facility] to be advised that pt has been transitioned to comfort care per notes.

## 2024-02-24 NOTE — Progress Notes (Signed)
 RN contacted doctor about pt breathing and unresponsiveness around 0518, pt still has pulse. Dr came to bedside, pt transitioned to comfort care measures. Wife at bedside.

## 2024-02-24 DEATH — deceased

## 2024-04-07 ENCOUNTER — Encounter: Admitting: Physician Assistant

## 2024-05-03 ENCOUNTER — Ambulatory Visit: Admitting: Endocrinology

## 2024-05-23 ENCOUNTER — Ambulatory Visit: Admitting: Nurse Practitioner
